# Patient Record
Sex: Male | Born: 1975 | Race: White | Hispanic: No | Marital: Single | Smoking: Current every day smoker
Health system: Southern US, Community
[De-identification: ages and names within clinical notes are randomized; demographics above are authoritative.]

## PROBLEM LIST (undated history)

## (undated) DIAGNOSIS — I739 Peripheral vascular disease, unspecified: Secondary | ICD-10-CM

## (undated) DIAGNOSIS — N2581 Secondary hyperparathyroidism of renal origin: Secondary | ICD-10-CM

## (undated) DIAGNOSIS — N186 End stage renal disease: Secondary | ICD-10-CM

## (undated) DIAGNOSIS — N289 Disorder of kidney and ureter, unspecified: Secondary | ICD-10-CM

## (undated) DIAGNOSIS — I10 Essential (primary) hypertension: Secondary | ICD-10-CM

## (undated) HISTORY — PX: AORTA - FEMORAL ARTERY BYPASS GRAFT: SUR173

## (undated) SURGERY — A/V FISTULAGRAM
Anesthesia: Moderate Sedation | Laterality: Left

---

## 2018-07-09 DIAGNOSIS — Z9889 Other specified postprocedural states: Secondary | ICD-10-CM | POA: Insufficient documentation

## 2018-08-18 DIAGNOSIS — N2581 Secondary hyperparathyroidism of renal origin: Secondary | ICD-10-CM | POA: Insufficient documentation

## 2018-09-15 DIAGNOSIS — N186 End stage renal disease: Secondary | ICD-10-CM | POA: Insufficient documentation

## 2018-09-15 DIAGNOSIS — E785 Hyperlipidemia, unspecified: Secondary | ICD-10-CM | POA: Insufficient documentation

## 2018-09-15 DIAGNOSIS — I5020 Unspecified systolic (congestive) heart failure: Secondary | ICD-10-CM | POA: Insufficient documentation

## 2018-09-30 DIAGNOSIS — E46 Unspecified protein-calorie malnutrition: Secondary | ICD-10-CM | POA: Insufficient documentation

## 2018-10-01 DIAGNOSIS — D649 Anemia, unspecified: Secondary | ICD-10-CM | POA: Insufficient documentation

## 2018-10-01 DIAGNOSIS — K219 Gastro-esophageal reflux disease without esophagitis: Secondary | ICD-10-CM | POA: Insufficient documentation

## 2018-12-23 ENCOUNTER — Emergency Department (HOSPITAL_COMMUNITY)
Admission: EM | Admit: 2018-12-23 | Discharge: 2018-12-24 | Disposition: A | Discharge status: Home / Self Care | Payer: Medicare Other | Source: Home / Self Care | Attending: Emergency Medicine | Admitting: Emergency Medicine

## 2018-12-23 ENCOUNTER — Encounter (HOSPITAL_COMMUNITY): Payer: Self-pay

## 2018-12-23 ENCOUNTER — Other Ambulatory Visit: Payer: Self-pay

## 2018-12-23 DIAGNOSIS — N186 End stage renal disease: Secondary | ICD-10-CM

## 2018-12-23 DIAGNOSIS — Z992 Dependence on renal dialysis: Secondary | ICD-10-CM

## 2018-12-23 HISTORY — DX: Disorder of kidney and ureter, unspecified: N28.9

## 2018-12-23 LAB — CBC
HCT: 34.9 % — ABNORMAL LOW (ref 39.0–52.0)
Hemoglobin: 11 g/dL — ABNORMAL LOW (ref 13.0–17.0)
MCH: 28.9 pg (ref 26.0–34.0)
MCHC: 31.5 g/dL (ref 30.0–36.0)
MCV: 91.6 fL (ref 80.0–100.0)
Platelets: 185 10*3/uL (ref 150–400)
RBC: 3.81 MIL/uL — ABNORMAL LOW (ref 4.22–5.81)
RDW: 15.2 % (ref 11.5–15.5)
WBC: 8.3 10*3/uL (ref 4.0–10.5)
nRBC: 0 % (ref 0.0–0.2)

## 2018-12-23 LAB — BASIC METABOLIC PANEL
Anion gap: 13 (ref 5–15)
BUN: 71 mg/dL — ABNORMAL HIGH (ref 6–20)
CO2: 21 mmol/L — ABNORMAL LOW (ref 22–32)
Calcium: 8.3 mg/dL — ABNORMAL LOW (ref 8.9–10.3)
Chloride: 106 mmol/L (ref 98–111)
Creatinine, Ser: 9.89 mg/dL — ABNORMAL HIGH (ref 0.61–1.24)
GFR calc Af Amer: 7 mL/min — ABNORMAL LOW (ref >60)
GFR calc non Af Amer: 6 mL/min — ABNORMAL LOW (ref >60)
Glucose, Bld: 100 mg/dL — ABNORMAL HIGH (ref 70–99)
Potassium: 4.7 mmol/L (ref 3.5–5.1)
Sodium: 140 mmol/L (ref 135–145)

## 2018-12-23 NOTE — ED Triage Notes (Signed)
Pt states he is from out of town and needs dialysis.

## 2018-12-23 NOTE — ED Provider Notes (Signed)
[facility] Provider Note   CSN: [id number] Arrival date & time: 12/23/18  1035    History   Chief Complaint Chief Complaint  Patient presents with  . Vascular Access Problem    HPI Brett Small is a 43 y.o. male.     HPI Patient presents to the emergency department with a need for dialysis.  The patient states he just moved here Saturday evening and needs dialysis as today is his regularly scheduled day.  Patient has not set up any outpatient dialysis here in [place].  The patient denies chest pain, shortness of breath, headache,blurred vision, neck pain, fever, cough, weakness, numbness, dizziness, anorexia, edema, abdominal pain, nausea, vomiting, diarrhea, rash, back pain, dysuria, hematemesis, bloody stool, near syncope, or syncope. Past Medical History:  Diagnosis Date  . Renal disorder     There are no active problems to display for this patient.        Home Medications    Prior to Admission medications   Not on File    Family History No family history on file.  Social History Social History   Tobacco Use  . Smoking status: Never Smoker  . Smokeless tobacco: Never Used  Substance Use Topics  . Alcohol use: Not Currently  . Drug use: Never     Allergies   Patient has no known allergies.   Review of Systems Review of Systems  All other systems negative except as documented in the HPI. All pertinent positives and negatives as reviewed in the HPI. Physical Exam Updated Vital Signs BP (!) 151/99   Pulse 74   Temp 98.1 F (36.7 C) (Oral)   Resp 16   SpO2 96%   Physical Exam Vitals signs and nursing note reviewed.  Constitutional:      General: He is not in acute distress.    Appearance: He is well-developed.  HENT:     Head: Normocephalic and atraumatic.  Eyes:     Pupils: Pupils are equal, round, and reactive to light.  Neck:     Musculoskeletal: Normal range of motion and neck supple.    Cardiovascular:     Rate and Rhythm: Normal rate and regular rhythm.     Heart sounds: Normal heart sounds. No murmur. No friction rub. No gallop.   Pulmonary:     Effort: Pulmonary effort is normal. No respiratory distress.     Breath sounds: Normal breath sounds. No wheezing.  Skin:    General: Skin is warm and dry.     Capillary Refill: Capillary refill takes less than 2 seconds.     Findings: No erythema or rash.  Neurological:     Mental Status: He is alert and oriented to person, place, and time.     Motor: No abnormal muscle tone.     Coordination: Coordination normal.  Psychiatric:        Behavior: Behavior normal.      ED Treatments / Results  Labs (all labs ordered are listed, but only abnormal results are displayed) Labs Reviewed  BASIC METABOLIC PANEL - Abnormal; Notable for the following components:      Result Value   CO2 21 (*)    Glucose, Bld 100 (*)    BUN 71 (*)    Creatinine, Ser 9.89 (*)    Calcium 8.3 (*)    GFR calc non Af Amer 6 (*)    GFR calc Af Amer 7 (*)    All other components within normal  limits  CBC - Abnormal; Notable for the following components:   RBC 3.81 (*)    Hemoglobin 11.0 (*)    HCT 34.9 (*)    All other components within normal limits    EKG None  Radiology No results found.  Procedures Procedures (including critical care time)  Medications Ordered in ED Medications - No data to display   Initial Impression / Assessment and Plan / ED Course  I have reviewed the triage vital signs and the nursing notes.  Pertinent labs & imaging results that were available during my care of the patient were reviewed by me and considered in my medical decision making (see chart for details).      I spoke with Dr. Burnett Sheng of nephrology who will get the patient has dialysis today.  Patient is been stable here in the emergency department otherwise.  Final Clinical Impressions(s) / ED Diagnoses   Final diagnoses:  None    ED  Discharge Orders    None       Dalia Heading, PA-C 12/23/18 1450    Lajean Saver, MD 12/23/18 1630

## 2018-12-23 NOTE — ED Notes (Signed)
RN informed Pt can 2 receive visitors

## 2018-12-24 ENCOUNTER — Emergency Department (HOSPITAL_COMMUNITY): Payer: Medicare Other

## 2018-12-24 ENCOUNTER — Other Ambulatory Visit: Payer: Self-pay

## 2018-12-24 ENCOUNTER — Inpatient Hospital Stay (HOSPITAL_COMMUNITY)
Admission: EM | Admit: 2018-12-24 | Discharge: 2019-01-07 | DRG: 264 | Disposition: A | Discharge status: Other Acute Inpatient Hospital | Payer: Medicare Other | Attending: Internal Medicine | Admitting: Internal Medicine

## 2018-12-24 DIAGNOSIS — I132 Hypertensive heart and chronic kidney disease with heart failure and with stage 5 chronic kidney disease, or end stage renal disease: Secondary | ICD-10-CM | POA: Diagnosis not present

## 2018-12-24 DIAGNOSIS — E8889 Other specified metabolic disorders: Secondary | ICD-10-CM | POA: Diagnosis present

## 2018-12-24 DIAGNOSIS — Z79899 Other long term (current) drug therapy: Secondary | ICD-10-CM

## 2018-12-24 DIAGNOSIS — F419 Anxiety disorder, unspecified: Secondary | ICD-10-CM | POA: Diagnosis present

## 2018-12-24 DIAGNOSIS — Z86718 Personal history of other venous thrombosis and embolism: Secondary | ICD-10-CM

## 2018-12-24 DIAGNOSIS — Z992 Dependence on renal dialysis: Secondary | ICD-10-CM

## 2018-12-24 DIAGNOSIS — J810 Acute pulmonary edema: Secondary | ICD-10-CM

## 2018-12-24 DIAGNOSIS — N186 End stage renal disease: Secondary | ICD-10-CM

## 2018-12-24 DIAGNOSIS — I739 Peripheral vascular disease, unspecified: Secondary | ICD-10-CM | POA: Diagnosis present

## 2018-12-24 DIAGNOSIS — Z885 Allergy status to narcotic agent status: Secondary | ICD-10-CM

## 2018-12-24 DIAGNOSIS — I10 Essential (primary) hypertension: Secondary | ICD-10-CM

## 2018-12-24 DIAGNOSIS — Z87891 Personal history of nicotine dependence: Secondary | ICD-10-CM

## 2018-12-24 DIAGNOSIS — Z882 Allergy status to sulfonamides status: Secondary | ICD-10-CM

## 2018-12-24 DIAGNOSIS — R0602 Shortness of breath: Secondary | ICD-10-CM | POA: Diagnosis not present

## 2018-12-24 DIAGNOSIS — Z7901 Long term (current) use of anticoagulants: Secondary | ICD-10-CM

## 2018-12-24 DIAGNOSIS — G8929 Other chronic pain: Secondary | ICD-10-CM | POA: Diagnosis present

## 2018-12-24 DIAGNOSIS — Z9115 Patient's noncompliance with renal dialysis: Secondary | ICD-10-CM

## 2018-12-24 DIAGNOSIS — J9601 Acute respiratory failure with hypoxia: Secondary | ICD-10-CM | POA: Diagnosis present

## 2018-12-24 DIAGNOSIS — F322 Major depressive disorder, single episode, severe without psychotic features: Secondary | ICD-10-CM

## 2018-12-24 DIAGNOSIS — Z7982 Long term (current) use of aspirin: Secondary | ICD-10-CM

## 2018-12-24 DIAGNOSIS — Z5900 Homelessness unspecified: Secondary | ICD-10-CM

## 2018-12-24 DIAGNOSIS — N2581 Secondary hyperparathyroidism of renal origin: Secondary | ICD-10-CM | POA: Diagnosis present

## 2018-12-24 DIAGNOSIS — F329 Major depressive disorder, single episode, unspecified: Secondary | ICD-10-CM | POA: Diagnosis present

## 2018-12-24 DIAGNOSIS — N250 Renal osteodystrophy: Secondary | ICD-10-CM

## 2018-12-24 DIAGNOSIS — Z8249 Family history of ischemic heart disease and other diseases of the circulatory system: Secondary | ICD-10-CM

## 2018-12-24 DIAGNOSIS — G629 Polyneuropathy, unspecified: Secondary | ICD-10-CM | POA: Diagnosis not present

## 2018-12-24 DIAGNOSIS — Z832 Family history of diseases of the blood and blood-forming organs and certain disorders involving the immune mechanism: Secondary | ICD-10-CM

## 2018-12-24 DIAGNOSIS — I5022 Chronic systolic (congestive) heart failure: Secondary | ICD-10-CM | POA: Diagnosis present

## 2018-12-24 DIAGNOSIS — R0989 Other specified symptoms and signs involving the circulatory and respiratory systems: Secondary | ICD-10-CM | POA: Diagnosis not present

## 2018-12-24 DIAGNOSIS — R45851 Suicidal ideations: Secondary | ICD-10-CM | POA: Diagnosis present

## 2018-12-24 DIAGNOSIS — D631 Anemia in chronic kidney disease: Secondary | ICD-10-CM | POA: Diagnosis present

## 2018-12-24 DIAGNOSIS — I161 Hypertensive emergency: Secondary | ICD-10-CM | POA: Diagnosis present

## 2018-12-24 HISTORY — DX: Essential (primary) hypertension: I10

## 2018-12-24 MED ORDER — LABETALOL HCL 5 MG/ML IV SOLN
20.0000 mg | Freq: Once | INTRAVENOUS | Status: DC
  Filled 2018-12-24: qty 4

## 2018-12-24 NOTE — ED Notes (Signed)
Reviewed d/c instructions with pt, who verbalized understanding and had no outstanding questions. Unable to obtain signature for d/c due to lack of functional WOW. Pt departed in NAD, refused use of wheelchair.

## 2018-12-24 NOTE — Discharge Instructions (Addendum)
Please return to the ER later this morning for your dialysis session

## 2018-12-24 NOTE — ED Triage Notes (Signed)
BIB EMS from hotel, pt here from out of town. Reports sudden onset of SOB PTA. Hx of ESRD, TThS dialysis, did not get tx Tuesday. Was seen here yesterday to have dialysis but chose to leave, was supposed to come back but did not. Sats initially 80's RA, was placed on NRB with fire and eventually switched to CPAP with EMS. EDP and RT at bedside.

## 2018-12-24 NOTE — ED Provider Notes (Signed)
I assumed care of patient at night shift.  Patient was to have dialysis.  Patient never received dialysis.  He is not hyperkalemic.  No hypoxia.  He has been resting comfortably.  Discussed the case with nephrologist Dr. Hollie Salk She has reviewed labs.  She reports patient will not be able to have dialysis till later this morning. The patient wishes to return in the morning he can leave and come back. I discussed this with the patient at length.  He would prefer to go home and will come back later this morning for dialysis. He was reassured that his labs at this point were appropriate for discharge.  He is in no acute distress.    Ripley Fraise, MD 12/24/18 [phone]

## 2018-12-24 NOTE — ED Provider Notes (Signed)
[facility] Provider Note   CSN: [id number] Arrival date & time: 12/24/18  2319    History   Chief Complaint Chief Complaint  Patient presents with  . Respiratory Distress    HPI Brett Small is a 43 y.o. male.     Level 5 caveat for respiratory distress.  Patient presents via EMS with sudden onset difficulty breathing and hypoxia.  EMS reports initial sats were 80s on room air and was placed on nonrebreather as well as CPAP.  Patient is ESRD patient, Tuesday Thursday Saturday.  His last dialysis session was Saturday, January 29.  He was seen in the ED yesterday but left before getting dialyzed.  He reports sudden onset shortness of breath with tightness in his chest.  Has had a nonproductive cough with one episode of vomiting.  No fever.  No abdominal pain.  No diarrhea.  Does take Xarelto for history of blood clots.  States he makes some urine.  Has been on dialysis in September 2019.  The history is provided by the patient and the EMS personnel. The history is limited by the condition of the patient.    Past Medical History:  Diagnosis Date  . Renal disorder     There are no active problems to display for this patient.   **The histories are not reviewed yet. Please review them in the "History" navigator section and refresh this [place].      Home Medications    Prior to Admission medications   Medication Sig Start Date End Date Taking? Authorizing Provider  acetaminophen (TYLENOL) 325 MG tablet Take 650 mg by mouth every 6 (six) hours as needed for mild pain or headache.    [provider]  amLODipine (NORVASC) 5 MG tablet Take 5 mg by mouth See admin instructions. Take 5 mg by mouth once a day and hold if Systolic reading is <431    [provider]  aspirin (BAYER LOW DOSE) 81 MG EC tablet Take 81 mg by mouth daily. Swallow whole.    [provider]  atorvastatin (LIPITOR) 80 MG tablet Take 80 mg by  mouth at bedtime.    [provider]  B Complex-C-Folic Acid (NEPHRO-VITE PO) Take 1 tablet by mouth daily.    [provider]  carvedilol (COREG) 25 MG tablet Take 25 mg by mouth 2 (two) times daily with a meal.    [provider]  gabapentin (NEURONTIN) 300 MG capsule Take 300 mg by mouth 3 (three) times daily.    [provider]  hydrALAZINE (APRESOLINE) 25 MG tablet Take 25 mg by mouth every 8 (eight) hours.    [provider]  hydrOXYzine (ATARAX/VISTARIL) 25 MG tablet Take 25 mg by mouth 3 (three) times daily as needed for anxiety.    [provider]  rivaroxaban (XARELTO) 2.5 MG TABS tablet Take 2.5 mg by mouth 2 (two) times daily.    [provider]    Family History No family history on file.  Social History Social History   Tobacco Use  . Smoking status: Never Smoker  . Smokeless tobacco: Never Used  Substance Use Topics  . Alcohol use: Not Currently  . Drug use: Never     Allergies   Codeine and Sulfa antibiotics   Review of Systems Review of Systems  Constitutional: Negative for activity change, appetite change and fever.  HENT: Negative for congestion and rhinorrhea.   Respiratory: Positive for cough, chest tightness and shortness of  breath.   Cardiovascular: Positive for chest pain and leg swelling.  Gastrointestinal: Negative for abdominal pain, nausea and vomiting.  Genitourinary: Negative for dysuria and hematuria.  Musculoskeletal: Negative for arthralgias and myalgias.  Skin: Negative for rash.  Neurological: Negative for dizziness, weakness, light-headedness and headaches.    all other systems are negative except as noted in the HPI and PMH.    Physical Exam Updated Vital Signs BP (!) 183/117 (BP Location: Right Arm)   Pulse (!) 128   Temp 98.6 F (37 C) (Oral)   Resp (!) 30   SpO2 94%   Physical Exam Vitals signs and nursing note reviewed.  Constitutional:      General: He is in  acute distress.     Appearance: He is well-developed.     Comments: Moderate respiratory distress, tachypneic on BiPAP Speaking short phrases  HENT:     Head: Normocephalic and atraumatic.     Mouth/Throat:     Pharynx: No oropharyngeal exudate.  Eyes:     Conjunctiva/sclera: Conjunctivae normal.     Pupils: Pupils are equal, round, and reactive to light.  Neck:     Musculoskeletal: Normal range of motion and neck supple.     Comments: No meningismus. Cardiovascular:     Rate and Rhythm: Regular rhythm. Tachycardia present.     Heart sounds: Normal heart sounds. No murmur.  Pulmonary:     Effort: Pulmonary effort is normal. No respiratory distress.     Breath sounds: Rhonchi present.     Comments: Coarse rhonchi throughout Abdominal:     Palpations: Abdomen is soft.     Tenderness: There is no abdominal tenderness. There is no guarding or rebound.  Musculoskeletal: Normal range of motion.        General: No tenderness.     Right lower leg: Edema present.     Left lower leg: Edema present.     Comments: +1 edema to knees  Skin:    General: Skin is warm.  Neurological:     Mental Status: He is alert and oriented to person, place, and time.     Cranial Nerves: No cranial nerve deficit.     Motor: No abnormal muscle tone.     Coordination: Coordination normal.     Comments: No ataxia on finger to nose bilaterally. No pronator drift. 5/5 strength throughout. CN 2-12 intact.Equal grip strength. Sensation intact.   Psychiatric:        Behavior: Behavior normal.      ED Treatments / Results  Labs (all labs ordered are listed, but only abnormal results are displayed) Labs Reviewed  CBC WITH DIFFERENTIAL/PLATELET - Abnormal; Notable for the following components:      Result Value   WBC 14.2 (*)    Hemoglobin 12.3 (*)    Neutro Abs 12.4 (*)    All other components within normal limits  COMPREHENSIVE METABOLIC PANEL - Abnormal; Notable for the following components:   CO2 16  (*)    Glucose, Bld 123 (*)    BUN 82 (*)    Creatinine, Ser 12.33 (*)    Calcium 8.1 (*)    GFR calc non Af Amer 4 (*)    GFR calc Af Amer 5 (*)    Anion gap 18 (*)    All other components within normal limits  BRAIN NATRIURETIC PEPTIDE - Abnormal; Notable for the following components:   B Natriuretic Peptide 2,318.1 (*)    All other components within normal limits  CBC - Abnormal; Notable for the following components:   WBC 12.1 (*)    RBC 3.46 (*)    Hemoglobin 9.9 (*)    HCT 31.2 (*)    All other components within normal limits  PHOSPHORUS - Abnormal; Notable for the following components:   Phosphorus 8.9 (*)    All other components within normal limits  I-STAT CREATININE, ED - Abnormal; Notable for the following components:   Creatinine, Ser 13.30 (*)    All other components within normal limits  CBG MONITORING, ED - Abnormal; Notable for the following components:   Glucose-Capillary 116 (*)    All other components within normal limits  POCT I-STAT EG7 - Abnormal; Notable for the following components:   pCO2, Ven 31.9 (*)    pO2, Ven 167.0 (*)    Bicarbonate 18.1 (*)    TCO2 19 (*)    Acid-base deficit 6.0 (*)    Calcium, Ion 0.91 (*)    HCT 37.0 (*)    Hemoglobin 12.6 (*)    All other components within normal limits  TROPONIN I  HIV ANTIBODY (ROUTINE TESTING W REFLEX)  RENAL FUNCTION PANEL  HEPATITIS B SURFACE ANTIGEN  HEPATITIS C ANTIBODY  HEPATITIS B CORE ANTIBODY, TOTAL  FERRITIN  IRON AND TIBC  VITAMIN D 25 HYDROXY (VIT D DEFICIENCY, FRACTURES)  PARATHYROID HORMONE, INTACT (NO CA)    EKG EKG Interpretation  Date/Time:  Wednesday December 24 2018 23:39:33 EST Ventricular Rate:  120 PR Interval:    QRS Duration: 75 QT Interval:  353 QTC Calculation: 499 R Axis:   64 Text Interpretation:  Sinus tachycardia Anterior infarct, old Nonspecific repol abnormality, lateral leads Baseline wander in lead(s) V3 Artifact No significant change was found Confirmed by  Ezequiel Essex [phone]) on 12/25/2018 1:13:07 AM   Radiology Dg Chest Portable 1 View  Result Date: 12/24/2018 CLINICAL DATA:  Shortness of breath EXAM: PORTABLE CHEST 1 VIEW COMPARISON:  None. FINDINGS: Right-sided central venous catheter tip over the SVC. No pleural effusion. Moderate-to-marked bilateral interstitial, ground glass opacity and consolidations right greater than left. Normal heart size. No pneumothorax. IMPRESSION: Moderate-to-marked bilateral right greater than left interstitial and ground-glass opacity, which may reflect pulmonary edema or diffuse infection. Electronically Signed   By: Donavan Foil M.D.   On: 12/24/2018 23:47    Procedures Procedures (including critical care time)  Medications Ordered in ED Medications - No data to display   Initial Impression / Assessment and Plan / ED Course  I have reviewed the triage vital signs and the nursing notes.  Pertinent labs & imaging results that were available during my care of the patient were reviewed by me and considered in my medical decision making (see chart for details).       ESRD patient with sudden onset shortness of breath and need for dialysis.  Complains of chest pressure and shortness of breath with hypoxia.  Patient placed on BiPAP with good response.  He is given IV labetalol and hydralazine for his blood pressure.  Potassium was normal at 4.7.  Chest x-ray consistent with pulmonary edema.  Need for urgent dialysis discussed with Dr. Augustin Coupe of nephrology.  Patient will be arranged to have dialysis later this morning.  Will plan admission to the hospitalist service. He is not hyperkalemic.   Patient remained stable on BiPAP.  His blood pressure has responded to IV labetalol and IV hydralazine.  His potassium was normal.  Denies chest pain and is breathing comfortably on BiPAP. He  has been seen by Dr. Augustin Coupe for dialysis later this morning.  Admission discussed with internal medicine residents.  CRITICAL  CARE Performed by: Ezequiel Essex Total critical care time: 35 minutes Critical care time was exclusive of separately billable procedures and treating other patients. Critical care was necessary to treat or prevent imminent or life-threatening deterioration. Critical care was time spent personally by me on the following activities: development of treatment plan with patient and/or surrogate as well as nursing, discussions with consultants, evaluation of patient's response to treatment, examination of patient, obtaining history from patient or surrogate, ordering and performing treatments and interventions, ordering and review of laboratory studies, ordering and review of radiographic studies, pulse oximetry and re-evaluation of patient's condition.  Final Clinical Impressions(s) / ED Diagnoses   Final diagnoses:  Acute pulmonary edema [facility])  Hypertensive emergency    ED Discharge Orders    None       Chyna Kneece, Annie Main, MD 12/25/18 [phone]

## 2018-12-25 ENCOUNTER — Inpatient Hospital Stay (HOSPITAL_COMMUNITY): Payer: Medicare Other

## 2018-12-25 ENCOUNTER — Encounter (HOSPITAL_COMMUNITY): Payer: Self-pay | Admitting: Internal Medicine

## 2018-12-25 DIAGNOSIS — F419 Anxiety disorder, unspecified: Secondary | ICD-10-CM | POA: Diagnosis present

## 2018-12-25 DIAGNOSIS — T827XXA Infection and inflammatory reaction due to other cardiac and vascular devices, implants and grafts, initial encounter: Secondary | ICD-10-CM | POA: Diagnosis not present

## 2018-12-25 DIAGNOSIS — Z9115 Patient's noncompliance with renal dialysis: Secondary | ICD-10-CM

## 2018-12-25 DIAGNOSIS — T82898A Other specified complication of vascular prosthetic devices, implants and grafts, initial encounter: Secondary | ICD-10-CM | POA: Diagnosis not present

## 2018-12-25 DIAGNOSIS — Z720 Tobacco use: Secondary | ICD-10-CM

## 2018-12-25 DIAGNOSIS — I161 Hypertensive emergency: Secondary | ICD-10-CM | POA: Diagnosis present

## 2018-12-25 DIAGNOSIS — Z7982 Long term (current) use of aspirin: Secondary | ICD-10-CM

## 2018-12-25 DIAGNOSIS — Z833 Family history of diabetes mellitus: Secondary | ICD-10-CM | POA: Diagnosis not present

## 2018-12-25 DIAGNOSIS — Z86718 Personal history of other venous thrombosis and embolism: Secondary | ICD-10-CM | POA: Diagnosis not present

## 2018-12-25 DIAGNOSIS — Z87891 Personal history of nicotine dependence: Secondary | ICD-10-CM | POA: Diagnosis not present

## 2018-12-25 DIAGNOSIS — I132 Hypertensive heart and chronic kidney disease with heart failure and with stage 5 chronic kidney disease, or end stage renal disease: Secondary | ICD-10-CM | POA: Diagnosis present

## 2018-12-25 DIAGNOSIS — Z95820 Peripheral vascular angioplasty status with implants and grafts: Secondary | ICD-10-CM

## 2018-12-25 DIAGNOSIS — F341 Dysthymic disorder: Secondary | ICD-10-CM | POA: Diagnosis present

## 2018-12-25 DIAGNOSIS — J9601 Acute respiratory failure with hypoxia: Secondary | ICD-10-CM | POA: Diagnosis present

## 2018-12-25 DIAGNOSIS — E8770 Fluid overload, unspecified: Secondary | ICD-10-CM | POA: Diagnosis present

## 2018-12-25 DIAGNOSIS — Z992 Dependence on renal dialysis: Secondary | ICD-10-CM

## 2018-12-25 DIAGNOSIS — T82858A Stenosis of vascular prosthetic devices, implants and grafts, initial encounter: Secondary | ICD-10-CM | POA: Diagnosis not present

## 2018-12-25 DIAGNOSIS — G629 Polyneuropathy, unspecified: Secondary | ICD-10-CM | POA: Diagnosis not present

## 2018-12-25 DIAGNOSIS — R0989 Other specified symptoms and signs involving the circulatory and respiratory systems: Secondary | ICD-10-CM | POA: Diagnosis not present

## 2018-12-25 DIAGNOSIS — R45851 Suicidal ideations: Secondary | ICD-10-CM | POA: Diagnosis present

## 2018-12-25 DIAGNOSIS — M79642 Pain in left hand: Secondary | ICD-10-CM | POA: Diagnosis not present

## 2018-12-25 DIAGNOSIS — Z01810 Encounter for preprocedural cardiovascular examination: Secondary | ICD-10-CM

## 2018-12-25 DIAGNOSIS — F322 Major depressive disorder, single episode, severe without psychotic features: Secondary | ICD-10-CM | POA: Diagnosis not present

## 2018-12-25 DIAGNOSIS — Z8249 Family history of ischemic heart disease and other diseases of the circulatory system: Secondary | ICD-10-CM | POA: Diagnosis not present

## 2018-12-25 DIAGNOSIS — N250 Renal osteodystrophy: Secondary | ICD-10-CM | POA: Diagnosis present

## 2018-12-25 DIAGNOSIS — I120 Hypertensive chronic kidney disease with stage 5 chronic kidney disease or end stage renal disease: Secondary | ICD-10-CM | POA: Diagnosis not present

## 2018-12-25 DIAGNOSIS — Z832 Family history of diseases of the blood and blood-forming organs and certain disorders involving the immune mechanism: Secondary | ICD-10-CM | POA: Diagnosis not present

## 2018-12-25 DIAGNOSIS — I5020 Unspecified systolic (congestive) heart failure: Secondary | ICD-10-CM | POA: Diagnosis not present

## 2018-12-25 DIAGNOSIS — Z79899 Other long term (current) drug therapy: Secondary | ICD-10-CM | POA: Diagnosis not present

## 2018-12-25 DIAGNOSIS — R0602 Shortness of breath: Secondary | ICD-10-CM | POA: Diagnosis present

## 2018-12-25 DIAGNOSIS — G8929 Other chronic pain: Secondary | ICD-10-CM

## 2018-12-25 DIAGNOSIS — Z882 Allergy status to sulfonamides status: Secondary | ICD-10-CM | POA: Diagnosis not present

## 2018-12-25 DIAGNOSIS — I5022 Chronic systolic (congestive) heart failure: Secondary | ICD-10-CM | POA: Diagnosis present

## 2018-12-25 DIAGNOSIS — D631 Anemia in chronic kidney disease: Secondary | ICD-10-CM | POA: Diagnosis present

## 2018-12-25 DIAGNOSIS — I739 Peripheral vascular disease, unspecified: Secondary | ICD-10-CM | POA: Diagnosis present

## 2018-12-25 DIAGNOSIS — T82318A Breakdown (mechanical) of other vascular grafts, initial encounter: Secondary | ICD-10-CM | POA: Diagnosis present

## 2018-12-25 DIAGNOSIS — Z7901 Long term (current) use of anticoagulants: Secondary | ICD-10-CM | POA: Diagnosis not present

## 2018-12-25 DIAGNOSIS — F4321 Adjustment disorder with depressed mood: Secondary | ICD-10-CM | POA: Diagnosis present

## 2018-12-25 DIAGNOSIS — Z881 Allergy status to other antibiotic agents status: Secondary | ICD-10-CM

## 2018-12-25 DIAGNOSIS — Z590 Homelessness: Secondary | ICD-10-CM | POA: Diagnosis not present

## 2018-12-25 DIAGNOSIS — N186 End stage renal disease: Secondary | ICD-10-CM

## 2018-12-25 DIAGNOSIS — I10 Essential (primary) hypertension: Secondary | ICD-10-CM

## 2018-12-25 DIAGNOSIS — Z885 Allergy status to narcotic agent status: Secondary | ICD-10-CM

## 2018-12-25 DIAGNOSIS — L03114 Cellulitis of left upper limb: Secondary | ICD-10-CM | POA: Diagnosis not present

## 2018-12-25 DIAGNOSIS — Z89422 Acquired absence of other left toe(s): Secondary | ICD-10-CM

## 2018-12-25 DIAGNOSIS — E1122 Type 2 diabetes mellitus with diabetic chronic kidney disease: Secondary | ICD-10-CM | POA: Diagnosis present

## 2018-12-25 DIAGNOSIS — Y832 Surgical operation with anastomosis, bypass or graft as the cause of abnormal reaction of the patient, or of later complication, without mention of misadventure at the time of the procedure: Secondary | ICD-10-CM | POA: Diagnosis present

## 2018-12-25 DIAGNOSIS — N2581 Secondary hyperparathyroidism of renal origin: Secondary | ICD-10-CM | POA: Diagnosis present

## 2018-12-25 DIAGNOSIS — E8889 Other specified metabolic disorders: Secondary | ICD-10-CM | POA: Diagnosis present

## 2018-12-25 DIAGNOSIS — M545 Low back pain: Secondary | ICD-10-CM

## 2018-12-25 DIAGNOSIS — I871 Compression of vein: Secondary | ICD-10-CM | POA: Diagnosis present

## 2018-12-25 DIAGNOSIS — F329 Major depressive disorder, single episode, unspecified: Secondary | ICD-10-CM | POA: Diagnosis present

## 2018-12-25 LAB — IRON AND TIBC
Iron: 18 ug/dL — ABNORMAL LOW (ref 45–182)
SATURATION RATIOS: 9 % — AB (ref 17.9–39.5)
TIBC: 211 ug/dL — AB (ref 250–450)
UIBC: 193 ug/dL

## 2018-12-25 LAB — COMPREHENSIVE METABOLIC PANEL
ALBUMIN: 3.5 g/dL (ref 3.5–5.0)
ALT: 17 U/L (ref 0–44)
AST: 18 U/L (ref 15–41)
Alkaline Phosphatase: 67 U/L (ref 38–126)
Anion gap: 18 — ABNORMAL HIGH (ref 5–15)
BUN: 82 mg/dL — ABNORMAL HIGH (ref 6–20)
CO2: 16 mmol/L — ABNORMAL LOW (ref 22–32)
Calcium: 8.1 mg/dL — ABNORMAL LOW (ref 8.9–10.3)
Chloride: 107 mmol/L (ref 98–111)
Creatinine, Ser: 12.33 mg/dL — ABNORMAL HIGH (ref 0.61–1.24)
GFR calc Af Amer: 5 mL/min — ABNORMAL LOW (ref >60)
GFR calc non Af Amer: 4 mL/min — ABNORMAL LOW (ref >60)
Glucose, Bld: 123 mg/dL — ABNORMAL HIGH (ref 70–99)
Potassium: 4.6 mmol/L (ref 3.5–5.1)
Sodium: 141 mmol/L (ref 135–145)
Total Bilirubin: 0.6 mg/dL (ref 0.3–1.2)
Total Protein: 7.1 g/dL (ref 6.5–8.1)

## 2018-12-25 LAB — FERRITIN: Ferritin: 165 ng/mL (ref 24–336)

## 2018-12-25 LAB — POCT I-STAT EG7
Acid-base deficit: 6 mmol/L — ABNORMAL HIGH (ref 0.0–2.0)
Bicarbonate: 18.1 mmol/L — ABNORMAL LOW (ref 20.0–28.0)
Calcium, Ion: 0.91 mmol/L — ABNORMAL LOW (ref 1.15–1.40)
HEMATOCRIT: 37 % — AB (ref 39.0–52.0)
Hemoglobin: 12.6 g/dL — ABNORMAL LOW (ref 13.0–17.0)
O2 Saturation: 99 %
POTASSIUM: 4.7 mmol/L (ref 3.5–5.1)
Sodium: 139 mmol/L (ref 135–145)
TCO2: 19 mmol/L — AB (ref 22–32)
pCO2, Ven: 31.9 mmHg — ABNORMAL LOW (ref 44.0–60.0)
pH, Ven: 7.363 (ref 7.250–7.430)
pO2, Ven: 167 mmHg — ABNORMAL HIGH (ref 32.0–45.0)

## 2018-12-25 LAB — CBC WITH DIFFERENTIAL/PLATELET
Abs Immature Granulocytes: 0.05 10*3/uL (ref 0.00–0.07)
BASOS ABS: 0.1 10*3/uL (ref 0.0–0.1)
Basophils Relative: 1 %
Eosinophils Absolute: 0.3 10*3/uL (ref 0.0–0.5)
Eosinophils Relative: 2 %
HCT: 39.3 % (ref 39.0–52.0)
Hemoglobin: 12.3 g/dL — ABNORMAL LOW (ref 13.0–17.0)
Immature Granulocytes: 0 %
Lymphocytes Relative: 6 %
Lymphs Abs: 0.8 10*3/uL (ref 0.7–4.0)
MCH: 28.4 pg (ref 26.0–34.0)
MCHC: 31.3 g/dL (ref 30.0–36.0)
MCV: 90.8 fL (ref 80.0–100.0)
Monocytes Absolute: 0.6 10*3/uL (ref 0.1–1.0)
Monocytes Relative: 4 %
NEUTROS ABS: 12.4 10*3/uL — AB (ref 1.7–7.7)
NEUTROS PCT: 87 %
NRBC: 0 % (ref 0.0–0.2)
Platelets: 214 10*3/uL (ref 150–400)
RBC: 4.33 MIL/uL (ref 4.22–5.81)
RDW: 15.3 % (ref 11.5–15.5)
WBC: 14.2 10*3/uL — ABNORMAL HIGH (ref 4.0–10.5)

## 2018-12-25 LAB — RENAL FUNCTION PANEL
Albumin: 2.8 g/dL — ABNORMAL LOW (ref 3.5–5.0)
Anion gap: 21 — ABNORMAL HIGH (ref 5–15)
BUN: 84 mg/dL — ABNORMAL HIGH (ref 6–20)
CALCIUM: 7.8 mg/dL — AB (ref 8.9–10.3)
CHLORIDE: 106 mmol/L (ref 98–111)
CO2: 15 mmol/L — ABNORMAL LOW (ref 22–32)
CREATININE: 12.16 mg/dL — AB (ref 0.61–1.24)
GFR calc Af Amer: 5 mL/min — ABNORMAL LOW (ref >60)
GFR calc non Af Amer: 4 mL/min — ABNORMAL LOW (ref >60)
Glucose, Bld: 112 mg/dL — ABNORMAL HIGH (ref 70–99)
Phosphorus: 9 mg/dL — ABNORMAL HIGH (ref 2.5–4.6)
Potassium: 4.4 mmol/L (ref 3.5–5.1)
Sodium: 142 mmol/L (ref 135–145)

## 2018-12-25 LAB — CBC
HCT: 31.2 % — ABNORMAL LOW (ref 39.0–52.0)
Hemoglobin: 9.9 g/dL — ABNORMAL LOW (ref 13.0–17.0)
MCH: 28.6 pg (ref 26.0–34.0)
MCHC: 31.7 g/dL (ref 30.0–36.0)
MCV: 90.2 fL (ref 80.0–100.0)
Platelets: 191 10*3/uL (ref 150–400)
RBC: 3.46 MIL/uL — ABNORMAL LOW (ref 4.22–5.81)
RDW: 15.3 % (ref 11.5–15.5)
WBC: 12.1 10*3/uL — ABNORMAL HIGH (ref 4.0–10.5)
nRBC: 0 % (ref 0.0–0.2)

## 2018-12-25 LAB — HIV ANTIBODY (ROUTINE TESTING W REFLEX): HIV Screen 4th Generation wRfx: NONREACTIVE

## 2018-12-25 LAB — CBG MONITORING, ED: Glucose-Capillary: 116 mg/dL — ABNORMAL HIGH (ref 70–99)

## 2018-12-25 LAB — BRAIN NATRIURETIC PEPTIDE: B Natriuretic Peptide: 2318.1 pg/mL — ABNORMAL HIGH (ref 0.0–100.0)

## 2018-12-25 LAB — PHOSPHORUS: Phosphorus: 8.9 mg/dL — ABNORMAL HIGH (ref 2.5–4.6)

## 2018-12-25 LAB — TROPONIN I: Troponin I: <0.03 ng/mL (ref <0.03)

## 2018-12-25 LAB — I-STAT CREATININE, ED: Creatinine, Ser: 13.3 mg/dL — ABNORMAL HIGH (ref 0.61–1.24)

## 2018-12-25 MED ORDER — SODIUM CHLORIDE 0.9 % IV SOLN
100.0000 mL | INTRAVENOUS | Status: DC | PRN
  Administered 2018-12-26 (×2): via INTRAVENOUS

## 2018-12-25 MED ORDER — LIDOCAINE HCL (PF) 1 % IJ SOLN: 5.0000 mL | INTRAMUSCULAR | Status: DC | PRN

## 2018-12-25 MED ORDER — HEPARIN SODIUM (PORCINE) 1000 UNIT/ML DIALYSIS
1000.0000 [IU] | INTRAMUSCULAR | Status: DC | PRN
  Administered 2018-12-27: 3000 [IU] via INTRAVENOUS_CENTRAL
  Administered 2018-12-30: 1000 [IU] via INTRAVENOUS_CENTRAL
  Filled 2018-12-25 (×3): qty 1

## 2018-12-25 MED ORDER — HEPARIN SODIUM (PORCINE) 5000 UNIT/ML IJ SOLN
5000.0000 [IU] | Freq: Three times a day (TID) | INTRAMUSCULAR | Status: DC
  Administered 2018-12-25 – 2018-12-27 (×5): 5000 [IU] via SUBCUTANEOUS
  Filled 2018-12-25 (×5): qty 1

## 2018-12-25 MED ORDER — SODIUM CHLORIDE 0.9 % IV SOLN
1.5000 g | INTRAVENOUS | Status: AC
  Administered 2018-12-26: 1.5 g via INTRAVENOUS
  Filled 2018-12-25: qty 1.5

## 2018-12-25 MED ORDER — RENA-VITE PO TABS
1.0000 | ORAL_TABLET | Freq: Every day | ORAL | Status: DC
  Administered 2018-12-25 – 2019-01-06 (×13): 1 via ORAL
  Filled 2018-12-25 (×13): qty 1

## 2018-12-25 MED ORDER — ATORVASTATIN CALCIUM 80 MG PO TABS
80.0000 mg | ORAL_TABLET | Freq: Every day | ORAL | Status: DC
  Administered 2018-12-25 – 2019-01-06 (×13): 80 mg via ORAL
  Filled 2018-12-25 (×13): qty 1

## 2018-12-25 MED ORDER — SODIUM CHLORIDE 0.9 % IV SOLN: 100.0000 mL | INTRAVENOUS | Status: DC | PRN

## 2018-12-25 MED ORDER — LABETALOL HCL 5 MG/ML IV SOLN
10.0000 mg | Freq: Once | INTRAVENOUS | Status: AC
  Administered 2018-12-25: 10 mg via INTRAVENOUS

## 2018-12-25 MED ORDER — HYDRALAZINE HCL 20 MG/ML IJ SOLN
10.0000 mg | Freq: Once | INTRAMUSCULAR | Status: DC
  Filled 2018-12-25: qty 1

## 2018-12-25 MED ORDER — PENTAFLUOROPROP-TETRAFLUOROETH EX AERO: 1.0000 "application " | INHALATION_SPRAY | CUTANEOUS | Status: DC | PRN

## 2018-12-25 MED ORDER — GABAPENTIN 300 MG PO CAPS
300.0000 mg | ORAL_CAPSULE | Freq: Three times a day (TID) | ORAL | Status: DC
  Administered 2018-12-25 – 2018-12-27 (×5): 300 mg via ORAL
  Filled 2018-12-25 (×7): qty 1

## 2018-12-25 MED ORDER — LIDOCAINE-PRILOCAINE 2.5-2.5 % EX CREA
1.0000 "application " | TOPICAL_CREAM | CUTANEOUS | Status: DC | PRN
  Filled 2018-12-25: qty 5

## 2018-12-25 MED ORDER — ONDANSETRON HCL 4 MG/2ML IJ SOLN: 4.0000 mg | Freq: Four times a day (QID) | INTRAMUSCULAR | Status: DC | PRN

## 2018-12-25 MED ORDER — AMLODIPINE BESYLATE 5 MG PO TABS
5.0000 mg | ORAL_TABLET | Freq: Every day | ORAL | Status: DC
  Administered 2018-12-25 – 2019-01-03 (×9): 5 mg via ORAL
  Filled 2018-12-25 (×10): qty 1

## 2018-12-25 MED ORDER — ASPIRIN EC 81 MG PO TBEC
81.0000 mg | DELAYED_RELEASE_TABLET | Freq: Every day | ORAL | Status: DC
  Administered 2018-12-25 – 2019-01-07 (×13): 81 mg via ORAL
  Filled 2018-12-25 (×14): qty 1

## 2018-12-25 MED ORDER — ALTEPLASE 2 MG IJ SOLR: 2.0000 mg | Freq: Once | INTRAMUSCULAR | Status: DC | PRN

## 2018-12-25 MED ORDER — ONDANSETRON HCL 4 MG PO TABS
4.0000 mg | ORAL_TABLET | Freq: Four times a day (QID) | ORAL | Status: DC | PRN
  Administered 2018-12-30: 4 mg via ORAL
  Filled 2018-12-25: qty 1

## 2018-12-25 MED ORDER — CHLORHEXIDINE GLUCONATE CLOTH 2 % EX PADS
6.0000 | MEDICATED_PAD | Freq: Every day | CUTANEOUS | Status: DC
  Administered 2018-12-26 – 2018-12-27 (×2): 6 via TOPICAL

## 2018-12-25 MED ORDER — HYDROXYZINE HCL 25 MG PO TABS: 25.0000 mg | ORAL_TABLET | Freq: Three times a day (TID) | ORAL | Status: DC | PRN

## 2018-12-25 MED ORDER — HYDRALAZINE HCL 25 MG PO TABS
25.0000 mg | ORAL_TABLET | Freq: Three times a day (TID) | ORAL | Status: DC
  Administered 2018-12-25 – 2019-01-07 (×31): 25 mg via ORAL
  Filled 2018-12-25 (×33): qty 1

## 2018-12-25 MED ORDER — LIDOCAINE 5 % EX PTCH
1.0000 | MEDICATED_PATCH | CUTANEOUS | Status: DC
  Filled 2018-12-25: qty 1

## 2018-12-25 MED ORDER — RIVAROXABAN 2.5 MG PO TABS: 2.5000 mg | ORAL_TABLET | Freq: Two times a day (BID) | ORAL | Status: DC

## 2018-12-25 MED ORDER — HEPARIN SODIUM (PORCINE) 1000 UNIT/ML IJ SOLN
INTRAMUSCULAR | Status: AC
  Administered 2018-12-25: 3000 [IU]
  Filled 2018-12-25: qty 3

## 2018-12-25 MED ORDER — CARVEDILOL 25 MG PO TABS
25.0000 mg | ORAL_TABLET | Freq: Two times a day (BID) | ORAL | Status: DC
  Administered 2018-12-25 – 2019-01-07 (×23): 25 mg via ORAL
  Filled 2018-12-25 (×8): qty 1
  Filled 2018-12-25: qty 2
  Filled 2018-12-25 (×15): qty 1

## 2018-12-25 MED ORDER — ACETAMINOPHEN 325 MG PO TABS
650.0000 mg | ORAL_TABLET | Freq: Four times a day (QID) | ORAL | Status: DC | PRN
  Administered 2018-12-26 – 2019-01-02 (×3): 650 mg via ORAL
  Filled 2018-12-25 (×3): qty 2

## 2018-12-25 NOTE — Consult Note (Signed)
[facility] Nurse wound consult note Reason for Consult: Nonhealing surgical wound to right lateral malleolus.  He states there is no history of osteomyelitis but periodically it will fester and purulent effluent will develop.  Today, it is dry.  Wound type:nonhealing chronic surgical wound Pressure Injury POA: NA Measurement: 3 cm x 4 cm x 0.2 cm- mostly scarred epithelium with drainage periodically from center.  Wound VQW:QVLD visible Drainage (amount, consistency, odor) none today Periwound:scarring Dressing procedure/placement/frequency:Noted sulfa allergy. Cleanse right ankle with NS and pat dry.  Apply Xeroform antimicrobial dressing. Cover with gauze and kerlix/tape.  Change daily.  Will not follow at this time.  Please re-consult if needed.  Domenic Moras MSN, RN, FNP-BC CWON Wound, Ostomy, Continence Nurse Pager [phone]

## 2018-12-25 NOTE — Progress Notes (Addendum)
   Subjective: Mr. Sievers was seen during HD today. He reported feeling better and that his breathing had improved. Denied any other symptoms at this time. He states he started HD in September of 2019 due to blood clots 2/2 to a bypass surgery he had. He did not have any plans for vascular access in the past. He is currently staying at a motel in [place] but plans on staying here long term.   Objective:  Vital signs in last 24 hours: Vitals:   12/25/18 1000 12/25/18 1030 12/25/18 1130 12/25/18 1200  BP: (!) 149/101 (!) 149/97 (!) 162/98 (!) 168/106  Pulse: 93 91 (!) 101 100  Resp: 20 (!) 24 15 16   Temp:      TempSrc:      SpO2: 100%     Weight:      Height:       Gen: comfortably sitting in bed at HD, no distress CV: RRR, no murmurs Skin: warm and dry  Assessment/Plan:  Principal Problem:   End stage renal disease (HCC) Active Problems:   Acute respiratory failure with hypoxia (HCC)   Hypertension   Renal osteodystrophy  42yoM on Xarelto for h/o blood clots and with ESRD on HD TThSa via temporary right IJ catheter since Sept 2019, HTN, HFrEF (EF 45-50%), PVD s/p aortobifemoral bypass w/ redo after closure and 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 His previous vascular surgeon wanted him to continue rivaroxaban even though he is ESRD.  - hold rivaroxaban  HTN - 159/96 - Continue with amlodipine 5mg  qd, coreg 25mg  bid, hydralazine 25mg  q8h  Chronic back pain - reports taking tylenol, tramadol, and oxycodone. The narcotic database does not show any regular  prescriptions for these medications. He received a 7 day supply of oxycodone in Jan and 5 day supply of tramadol in Dec.  - Continue tylenol, lidocaine patch, k pad    Dispo: Anticipated discharge pending being CLIP'd.  Mike Craze, DO 12/25/2018, 1:09 PM Pager: [phone]

## 2018-12-25 NOTE — Progress Notes (Addendum)
   Subjective: No events overnight. Brett Small was made npo at midnight to prepare for a vascular surgery procedure. Brett Small feels much better after receiving dialysis yesterday. His breathing is improved. He had a vascular surgery procedure this morning, and now notices some numbness and tingling in the thumb, index, and middle ringer of his left hand. He has no pain in his hand, just a change in sensation. Otherwise, Brett Small has no concerns  Objective:  Vital signs in last 24 hours: Vitals:   12/26/18 0616 12/26/18 1125 12/26/18 1140 12/26/18 1155  BP: 121/66  117/78 111/70  Pulse: 80  74 77  Resp: 16  13 (!) 8  Temp: 98.1 F (36.7 C) (!) 97.5 F (36.4 C)  97.7 F (36.5 C)  TempSrc: Oral     SpO2: 98%  95% 92%  Weight: 48.3 kg     Height:       Physical exam: General: pt is comfortable in bed Cardiovascular: rrr, no murmurs, thrills, or heaves. No extremity edema. Pulses 2+ in left brachial, ulnar, and radial arteries. Respiratory: lungs are CTA bilaterally, good respiratory effort Integumentary: warm and dry Extremities: venous graft in the upper left arm. Paresthesias in the left hand along the median nerve distribution. No swelling or erythema in the left arm. Strength is 5/5, ulnar and radial pulses are 2+.   Assessment/Plan:  Principal Problem:   End stage renal disease ([place]) Active Problems:   Acute respiratory failure with hypoxia (HCC)   Hypertension   Renal osteodystrophy  43 year old person admitted with pulmonary edema due to ESRD on HD TThSa via temporary right IJ catheter since Sept 2019, HTN, HFrEF (EF45-50%), PVDs/p aortobifemoral bypass w/ redo after closure and partial amputation of L 4th and 5 toespresenting on 3/5 with acute onset sob in setting of missing dialysis.   Acute respiratory failure with hypoxia 2/2 ESRD on HD. Much improved after HD on 3/5. Looks euvolemic today. Needs local HD center and we are awaiting CLIP process. Greatly appreciate  nephrology consultation.  - Plans to be CLIP'd  PVD. 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 Continue aspirin 81mg  daily. Would also like to restart low dose xarelto given his history of recurrent graft thrombosis, may be able to start back tomorrow if ok with vascular surgery from bleeding risk standpoint.  HTN - 111/70 (3/6 1155) - Continue with amlodipine 5mg  qd, coreg 25mg  bid, hydralazine 25mg  q8h  Chronic back pain. Stable. - Continue tylenol, lidocaine patch, k pad   Dispo: Anticipated discharge pending being CLIP'd.   LOS: 1 day   Noralyn Pick, Medical Student 12/26/2018, 3:23 PM Pager: [phone]   Attestation for Student Documentation:  I personally was present and performed or re-performed the history, physical exam and medical decision-making activities of this service and have verified that the service and findings are accurately documented in the student's note. I made necessary changes to this note.  Axel Filler, MD 12/26/2018, 3:31 PM

## 2018-12-25 NOTE — ED Notes (Signed)
Per admitting provider left arm should now be considered restricted.

## 2018-12-25 NOTE — ED Notes (Signed)
ED TO INPATIENT HANDOFF REPORT  ED Nurse Name and Phone #:  [place] [phone]  S Name/Age/Gender Brett Small 43 y.o. male Room/Bed: TRAAC/TRAAC  Code Status   Code Status: Partial Code  Home/SNF/Other Home Patient oriented to: self, place, time and situation Is this baseline? Yes   Triage Complete: Triage complete  Chief Complaint CPAP  Triage Note BIB EMS from hotel, pt here from out of town. Reports sudden onset of SOB PTA. Hx of ESRD, TThS dialysis, did not get tx Tuesday. Was seen here yesterday to have dialysis but chose to leave, was supposed to come back but did not. Sats initially 80's RA, was placed on NRB with fire and eventually switched to CPAP with EMS. EDP and RT at bedside.   Allergies Allergies  Allergen Reactions  . Codeine Nausea Only    Patient questioned this (??)  . Sulfa Antibiotics Hives and Nausea And Vomiting    Level of Care/Admitting Diagnosis ED Disposition    ED Disposition Condition [facility] Area: [place] [100100]  Level of Care: Progressive [102]  Diagnosis: Acute respiratory failure with hypoxia [facility]) [[id number]]  Admitting Physician: Axel Filler [phone]  Attending Physician: Axel Filler [[id number]]  Estimated length of stay: 5 - 7 days  Certification:: I certify this patient will need inpatient services for at least 2 midnights  PT Class (Do Not Modify): Inpatient [101]  PT Acc Code (Do Not Modify): Private [1]       B Medical/Surgery History Past Medical History:  Diagnosis Date  . Hypertension   . Renal disorder       A IV Location/Drains/Wounds Patient Lines/Drains/Airways Status   Active Line/Drains/Airways    Name:   Placement date:   Placement time:   Site:   Days:   Peripheral IV 12/25/18 Right;Upper Arm   12/25/18    0117    Arm   less than 1          Intake/Output Last 24 hours No intake or output data in the 24 hours ending 12/25/18  0308  Labs/Imaging Results for orders placed or performed during the hospital encounter of 12/24/18 (from the past 48 hour(s))  CBC with Differential/Platelet     Status: Abnormal   Collection Time: 12/24/18 11:55 PM  Result Value Ref Range   WBC 14.2 (H) 4.0 - 10.5 K/uL   RBC 4.33 4.22 - 5.81 MIL/uL   Hemoglobin 12.3 (L) 13.0 - 17.0 g/dL   HCT 39.3 39.0 - 52.0 %   MCV 90.8 80.0 - 100.0 fL   MCH 28.4 26.0 - 34.0 pg   MCHC 31.3 30.0 - 36.0 g/dL   RDW 15.3 11.5 - 15.5 %   Platelets 214 150 - 400 K/uL   nRBC 0.0 0.0 - 0.2 %   Neutrophils Relative % 87 %   Neutro Abs 12.4 (H) 1.7 - 7.7 K/uL   Lymphocytes Relative 6 %   Lymphs Abs 0.8 0.7 - 4.0 K/uL   Monocytes Relative 4 %   Monocytes Absolute 0.6 0.1 - 1.0 K/uL   Eosinophils Relative 2 %   Eosinophils Absolute 0.3 0.0 - 0.5 K/uL   Basophils Relative 1 %   Basophils Absolute 0.1 0.0 - 0.1 K/uL   Immature Granulocytes 0 %   Abs Immature Granulocytes 0.05 0.00 - 0.07 K/uL    Comment: Performed at [facility], [address], [place], [place] [address]  Comprehensive metabolic panel  Status: Abnormal   Collection Time: 12/24/18 11:55 PM  Result Value Ref Range   Sodium 141 135 - 145 mmol/L   Potassium 4.6 3.5 - 5.1 mmol/L   Chloride 107 98 - 111 mmol/L   CO2 16 (L) 22 - 32 mmol/L   Glucose, Bld 123 (H) 70 - 99 mg/dL   BUN 82 (H) 6 - 20 mg/dL   Creatinine, Ser 12.33 (H) 0.61 - 1.24 mg/dL   Calcium 8.1 (L) 8.9 - 10.3 mg/dL   Total Protein 7.1 6.5 - 8.1 g/dL   Albumin 3.5 3.5 - 5.0 g/dL   AST 18 15 - 41 U/L   ALT 17 0 - 44 U/L   Alkaline Phosphatase 67 38 - 126 U/L   Total Bilirubin 0.6 0.3 - 1.2 mg/dL   GFR calc non Af Amer 4 (L) >60 mL/min   GFR calc Af Amer 5 (L) >60 mL/min   Anion gap 18 (H) 5 - 15    Comment: Performed at [facility], [address], [place], [place] [address]  Brain natriuretic peptide     Status: Abnormal   Collection Time: 12/24/18 11:55 PM  Result Value Ref Range   B Natriuretic  Peptide 2,318.1 (H) 0.0 - 100.0 pg/mL    Comment: Performed at [facility] [address] [place], [place] [address]  Troponin I - ONCE - STAT     Status: None   Collection Time: 12/24/18 11:55 PM  Result Value Ref Range   Troponin I <0.03 <0.03 ng/mL    Comment: Performed at [facility], [place] [address] [place], [place] [address]  I-stat Creatinine, ED     Status: Abnormal   Collection Time: 12/25/18 12:08 AM  Result Value Ref Range   Creatinine, Ser 13.30 (H) 0.61 - 1.24 mg/dL  POCT I-Stat EG7     Status: Abnormal   Collection Time: 12/25/18 12:08 AM  Result Value Ref Range   pH, Ven 7.363 7.250 - 7.430   pCO2, Ven 31.9 (L) 44.0 - 60.0 mmHg   pO2, Ven 167.0 (H) 32.0 - 45.0 mmHg   Bicarbonate 18.1 (L) 20.0 - 28.0 mmol/L   TCO2 19 (L) 22 - 32 mmol/L   O2 Saturation 99.0 %   Acid-base deficit 6.0 (H) 0.0 - 2.0 mmol/L   Sodium 139 135 - 145 mmol/L   Potassium 4.7 3.5 - 5.1 mmol/L   Calcium, Ion 0.91 (L) 1.15 - 1.40 mmol/L   HCT 37.0 (L) 39.0 - 52.0 %   Hemoglobin 12.6 (L) 13.0 - 17.0 g/dL   Patient temperature HIDE    Sample type VENOUS   CBG monitoring, ED     Status: Abnormal   Collection Time: 12/25/18 12:53 AM  Result Value Ref Range   Glucose-Capillary 116 (H) 70 - 99 mg/dL   Dg Chest Portable 1 View  Result Date: 12/24/2018 CLINICAL DATA:  Shortness of breath EXAM: PORTABLE CHEST 1 VIEW COMPARISON:  None. FINDINGS: Right-sided central venous catheter tip over the SVC. No pleural effusion. Moderate-to-marked bilateral interstitial, ground glass opacity and consolidations right greater than left. Normal heart size. No pneumothorax. IMPRESSION: Moderate-to-marked bilateral right greater than left interstitial and ground-glass opacity, which may reflect pulmonary edema or diffuse infection. Electronically Signed   By: Donavan Foil M.D.   On: 12/24/2018 23:47    Pending Labs Unresulted Labs (From admission, onward)    Start     Ordered   12/25/18 0500  CBC  Tomorrow morning,   R     12/25/18 0158   12/25/18 0500  Renal function panel  Tomorrow morning,   R     12/25/18 0158   12/25/18 0156  HIV antibody (Routine Testing)  Once,   R     12/25/18 0158          Vitals/Pain Today's Vitals   12/25/18 0215 12/25/18 0230 12/25/18 0245 12/25/18 0300  BP: (!) 142/92 (!) 141/93 136/88 134/86  Pulse: 97 100 100 (!) 101  Resp: (!) 0 (!) 0 10 17  Temp:      TempSrc:      SpO2: 97% 97% 97% 97%  Weight:      Height:      PainSc:        Isolation Precautions No active isolations  Medications Medications  acetaminophen (TYLENOL) tablet 650 mg (has no administration in time range)  aspirin EC tablet 81 mg (has no administration in time range)  amLODipine (NORVASC) tablet 5 mg (has no administration in time range)  atorvastatin (LIPITOR) tablet 80 mg (80 mg Oral Not Given 12/25/18 0237)  carvedilol (COREG) tablet 25 mg (has no administration in time range)  hydrALAZINE (APRESOLINE) tablet 25 mg (has no administration in time range)  hydrOXYzine (ATARAX/VISTARIL) tablet 25 mg (has no administration in time range)  gabapentin (NEURONTIN) capsule 300 mg (has no administration in time range)  b complex-vitamin c-folic acid (NEPHRO-VITE) tablet 1 tablet (has no administration in time range)  ondansetron (ZOFRAN) tablet 4 mg (has no administration in time range)    Or  ondansetron (ZOFRAN) injection 4 mg (has no administration in time range)  heparin injection 5,000 Units (has no administration in time range)  lidocaine (LIDODERM) 5 % 1 patch (has no administration in time range)  labetalol (NORMODYNE,TRANDATE) injection 10 mg (10 mg Intravenous Given 12/25/18 0128)    Mobility walks Moderate fall risk   Focused Assessments Renal Assessment Handoff:  Hemodialysis Schedule: Hemodialysis Schedule: Tuesday/Thursday/Saturday Last Hemodialysis date and time: saturday   Restricted appendage: HD cath R chest  , Pulmonary Assessment  Handoff:  Lung sounds: Bilateral Breath Sounds: Rhonchi L Breath Sounds: Rhonchi R Breath Sounds: Rhonchi O2 Device: Bi-PAP        R Recommendations: See Admitting Provider Note  Report given to:   Additional Notes: Pt on BiPAP, A + 0 x4

## 2018-12-25 NOTE — Consult Note (Signed)
Reason for Consult: ESRD Referring Physician:  Dr. Lalla Brothers  Chief Complaint:  Shortness of breath  Assessment/Plan: 1. ESRD  - Will dialyze this AM  - Floor scale weights and will challenge EDW as tolerated. - Will call his home unit outside of [facility] for further orders and history. - Vascular consult in the AM for permanent access placement. - Will need to be CLIP'd  As well. 2. Renal osteodystrophy - Will check a phos and iPTH for binder management. 3. Anemia - @ goal. Will check iron panels. No ESA for now. 4. 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 HTN - responding nicely to IV Labetolol and hydralazine. Restart home regimen and will dialyze this AM.   HPI: Brett Small is an 43 y.o. male ESRD TTS last HD was on Saturday outside of [facility], initiated on HD 0/1027 after complications w/ prior aortobifemoral bypass -> redo + started on Xarelto because of blood clots. He moved to [place] on Saturday bec of family circumstances not having yet established a place to dialyze. He actually presented to [facility] ED 12/23/2018 but refused admission at that time and did not receive dialysis. He is now presenting with worsening dyspnea + orthopnea found to be in pulmonary edema on CXR as well as on physical exam; he was also hypertensive with systolic >253'G but responded nicely to hydralazine and labetolol IV. He was started on BiPAP in the ED. He denies f/c/cough/ myalgias; he still makes some urine. He is currently dialyzing through a [facility] and was scheduled for access evaluation but moved prior to an access being placed.   ROS Pertinent items are noted in HPI. Has LBP chronically and is on tylenol, tramadol and oxycodone  Chemistry and CBC: Creatinine, Ser  Date/Time Value Ref Range Status  12/25/2018 12:08 AM 13.30 (H) 0.61 - 1.24 mg/dL Final  12/24/2018 11:55 PM 12.33 (H) 0.61 - 1.24 mg/dL Final  12/23/2018 10:48 AM 9.89 (H) 0.61 - 1.24 mg/dL Final    Recent Labs  Lab 12/23/18 1048 12/24/18 2355 12/25/18 0008  NA 140 141 139  K 4.7 4.6 4.7  CL 106 107  --   CO2 21* 16*  --   GLUCOSE 100* 123*  --   BUN 71* 82*  --   CREATININE 9.89* 12.33* 13.30*  CALCIUM 8.3* 8.1*  --    Recent Labs  Lab 12/23/18 1055 12/24/18 2355 12/25/18 0008  WBC 8.3 14.2*  --   NEUTROABS  --  12.4*  --   HGB 11.0* 12.3* 12.6*  HCT 34.9* 39.3 37.0*  MCV 91.6 90.8  --   PLT 185 214  --    Liver Function Tests: Recent Labs  Lab 12/24/18 2355  AST 18  ALT 17  ALKPHOS 67  BILITOT 0.6  PROT 7.1  ALBUMIN 3.5   No results for input(s): LIPASE, AMYLASE in the last 168 hours. No results for input(s): AMMONIA in the last 168 hours. Cardiac Enzymes: Recent Labs  Lab 12/24/18 2355  TROPONINI <0.03   Iron Studies: No results for input(s): IRON, TIBC, TRANSFERRIN, FERRITIN in the last 72 hours. PT/INR: @LABRCNTIP (inr:5)  Xrays/Other Studies: ) Results for orders placed or performed during the hospital encounter of 12/24/18 (from the past 48 hour(s))  CBC with Differential/Platelet     Status: Abnormal   Collection Time: 12/24/18 11:55 PM  Result Value Ref Range   WBC 14.2 (H) 4.0 - 10.5 K/uL   RBC 4.33 4.22 -  5.81 MIL/uL   Hemoglobin 12.3 (L) 13.0 - 17.0 g/dL   HCT 39.3 39.0 - 52.0 %   MCV 90.8 80.0 - 100.0 fL   MCH 28.4 26.0 - 34.0 pg   MCHC 31.3 30.0 - 36.0 g/dL   RDW 15.3 11.5 - 15.5 %   Platelets 214 150 - 400 K/uL   nRBC 0.0 0.0 - 0.2 %   Neutrophils Relative % 87 %   Neutro Abs 12.4 (H) 1.7 - 7.7 K/uL   Lymphocytes Relative 6 %   Lymphs Abs 0.8 0.7 - 4.0 K/uL   Monocytes Relative 4 %   Monocytes Absolute 0.6 0.1 - 1.0 K/uL   Eosinophils Relative 2 %   Eosinophils Absolute 0.3 0.0 - 0.5 K/uL   Basophils Relative 1 %   Basophils Absolute 0.1 0.0 - 0.1 K/uL   Immature Granulocytes 0 %   Abs Immature Granulocytes 0.05 0.00 - 0.07 K/uL    Comment: Performed at [facility] [address] [place], [place] [address]   Comprehensive metabolic panel     Status: Abnormal   Collection Time: 12/24/18 11:55 PM  Result Value Ref Range   Sodium 141 135 - 145 mmol/L   Potassium 4.6 3.5 - 5.1 mmol/L   Chloride 107 98 - 111 mmol/L   CO2 16 (L) 22 - 32 mmol/L   Glucose, Bld 123 (H) 70 - 99 mg/dL   BUN 82 (H) 6 - 20 mg/dL   Creatinine, Ser 12.33 (H) 0.61 - 1.24 mg/dL   Calcium 8.1 (L) 8.9 - 10.3 mg/dL   Total Protein 7.1 6.5 - 8.1 g/dL   Albumin 3.5 3.5 - 5.0 g/dL   AST 18 15 - 41 U/L   ALT 17 0 - 44 U/L   Alkaline Phosphatase 67 38 - 126 U/L   Total Bilirubin 0.6 0.3 - 1.2 mg/dL   GFR calc non Af Amer 4 (L) >60 mL/min   GFR calc Af Amer 5 (L) >60 mL/min   Anion gap 18 (H) 5 - 15    Comment: Performed at [facility], [place] [address], [place], [place] [address]  Brain natriuretic peptide     Status: Abnormal   Collection Time: 12/24/18 11:55 PM  Result Value Ref Range   B Natriuretic Peptide 2,318.1 (H) 0.0 - 100.0 pg/mL    Comment: Performed at [facility] [address] [place], [place] [address]  Troponin I - ONCE - STAT     Status: None   Collection Time: 12/24/18 11:55 PM  Result Value Ref Range   Troponin I <0.03 <0.03 ng/mL    Comment: Performed at [facility], [place] [address], [place], [place] [address]  I-stat Creatinine, ED     Status: Abnormal   Collection Time: 12/25/18 12:08 AM  Result Value Ref Range   Creatinine, Ser 13.30 (H) 0.61 - 1.24 mg/dL  POCT I-Stat EG7     Status: Abnormal   Collection Time: 12/25/18 12:08 AM  Result Value Ref Range   pH, Ven 7.363 7.250 - 7.430   pCO2, Ven 31.9 (L) 44.0 - 60.0 mmHg   pO2, Ven 167.0 (H) 32.0 - 45.0 mmHg   Bicarbonate 18.1 (L) 20.0 - 28.0 mmol/L   TCO2 19 (L) 22 - 32 mmol/L   O2 Saturation 99.0 %   Acid-base deficit 6.0 (H) 0.0 - 2.0 mmol/L   Sodium 139 135 - 145 mmol/L   Potassium 4.7 3.5 - 5.1 mmol/L  Calcium, Ion 0.91 (L) 1.15 - 1.40 mmol/L   HCT 37.0 (L) 39.0 - 52.0 %   Hemoglobin 12.6 (L) 13.0 - 17.0 g/dL    Patient temperature HIDE    Sample type VENOUS   CBG monitoring, ED     Status: Abnormal   Collection Time: 12/25/18 12:53 AM  Result Value Ref Range   Glucose-Capillary 116 (H) 70 - 99 mg/dL   Dg Chest Portable 1 View  Result Date: 12/24/2018 CLINICAL DATA:  Shortness of breath EXAM: PORTABLE CHEST 1 VIEW COMPARISON:  None. FINDINGS: Right-sided central venous catheter tip over the SVC. No pleural effusion. Moderate-to-marked bilateral interstitial, ground glass opacity and consolidations right greater than left. Normal heart size. No pneumothorax. IMPRESSION: Moderate-to-marked bilateral right greater than left interstitial and ground-glass opacity, which may reflect pulmonary edema or diffuse infection. Electronically Signed   By: Donavan Foil M.D.   On: 12/24/2018 23:47    PMH:   Past Medical History:  Diagnosis Date  . Hypertension   . 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 Codeine Nausea Only    Patient questioned this (??)  . Sulfa Antibiotics Hives and Nausea And Vomiting    Medications:   Prior to Admission medications   Medication Sig Start Date End Date Taking? Authorizing Provider  acetaminophen (TYLENOL) 325 MG tablet Take 650 mg by mouth every 6 (six) hours as needed for mild pain or headache.   Yes [provider]  amLODipine (NORVASC) 5 MG tablet Take 5 mg by mouth daily. hold if Systolic reading is <242   Yes [provider]  aspirin EC 81 MG tablet Take 81 mg by mouth daily.   Yes [provider]  atorvastatin (LIPITOR) 80 MG tablet Take 80 mg by mouth at bedtime.   Yes [provider]  B Complex-C-Folic Acid (NEPHRO-VITE PO) Take 1 tablet by mouth daily.   Yes [provider]  carvedilol (COREG) 25 MG tablet Take 25 mg by mouth 2 (two) times daily with a meal.   Yes [provider]  gabapentin  (NEURONTIN) 300 MG capsule Take 300 mg by mouth 3 (three) times daily.   Yes [provider]  hydrALAZINE (APRESOLINE) 25 MG tablet Take 25 mg by mouth every 8 (eight) hours.   Yes [provider]  hydrOXYzine (ATARAX/VISTARIL) 25 MG tablet Take 25 mg by mouth 3 (three) times daily as needed for anxiety.   Yes [provider]  rivaroxaban (XARELTO) 2.5 MG TABS tablet Take 2.5 mg by mouth 2 (two) times daily.   Yes [provider]    Discontinued Meds:   Medications Discontinued During This Encounter  Medication Reason  . aspirin (BAYER LOW DOSE) 81 MG EC tablet Change in therapy  . hydrALAZINE (APRESOLINE) injection 10 mg   . labetalol (NORMODYNE,TRANDATE) injection 20 mg   . rivaroxaban (XARELTO) tablet 2.5 mg     Social History:  reports that he has never smoked. He has never used smokeless tobacco. He reports previous alcohol use. He reports that he does not use drugs.  Family History:   Family History  Problem Relation Age of Onset  . Hypertension Other   . Diabetes Other   . Clotting disorder Father     Blood pressure 134/86, pulse (!) 101, temperature 98.6 F (37 C), temperature source Oral, resp. rate 17, height 5'  1" (1.549 m), weight 6.35 kg, SpO2 97 %. General appearance: alert, cooperative and appears stated age Head: Normocephalic, without obvious abnormality, atraumatic Eyes: negative Neck: no adenopathy, no carotid bruit, supple, symmetrical, trachea midline and thyroid not enlarged, symmetric, no tenderness/mass/nodules Back: symmetric, no curvature. ROM normal. No CVA tenderness. 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 No rashes or lesions Lymph nodes: Cervical,  supraclavicular, and axillary nodes normal. Neurologic: Grossly normal       Dema Timmons, Hunt Oris, MD 12/25/2018, 4:02 AM

## 2018-12-25 NOTE — ED Notes (Signed)
Report called to dialysis and patient taken to dialysis with this RN & RT.

## 2018-12-25 NOTE — Consult Note (Addendum)
[facility] NOTE   MRN : [id number]  Reason for Consult: ESRD Referring Physician: Dr. Augustin Coupe  History of Present Illness: 43 y/o male with ESRD who just moved to [place] from [place] on Saturday.  He is currently on HD via a temp catheter.  We have been asked to provide permanent access and exchange of the temp cath for [facility].  The right IJ temp cath was placed in Sept. 2019.  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  The wound has been present since 2012.   presenting with acute onset SOB in setting of missing dialysis. States that this evening at 9PM he began to feel [facility] and came to the ED.       Current Facility-Administered Medications  Medication Dose Route Frequency Provider Last Rate Last Dose  . 0.9 %  sodium chloride infusion  100 mL Intravenous PRN Dwana Melena, MD      . 0.9 %  sodium chloride infusion  100 mL Intravenous PRN Dwana Melena, MD      . acetaminophen (TYLENOL) tablet 650 mg  650 mg Oral Q6H PRN Ina Homes, MD      . alteplase (CATHFLO ACTIVASE) injection 2 mg  2 mg Intracatheter Once PRN Dwana Melena, MD      . amLODipine (NORVASC) tablet 5 mg  5 mg Oral Daily Ina Homes, MD   5 mg at 12/25/18 1355  . aspirin EC tablet 81 mg  81 mg Oral Daily Ina Homes, MD   81 mg at 12/25/18 1355  . atorvastatin (LIPITOR) tablet 80 mg  80 mg Oral QHS Helberg, Justin, MD      . carvedilol (COREG) tablet 25 mg  25 mg Oral BID WC Helberg, Justin, MD   25 mg at 12/25/18 1401  . Chlorhexidine Gluconate Cloth 2 % PADS 6 each  6 each Topical Q0600 Dwana Melena, MD   Stopped at 12/25/18 0630  . gabapentin (NEURONTIN) capsule 300 mg  300 mg Oral TID Ina Homes, MD   300 mg at 12/25/18 1354  . heparin injection 1,000  Units  1,000 Units Dialysis PRN Dwana Melena, MD      . heparin injection 5,000 Units  5,000 Units Subcutaneous Q8H Ina Homes, MD   5,000 Units at 12/25/18 1354  . hydrALAZINE (APRESOLINE) tablet 25 mg  25 mg Oral Q8H Helberg, Justin, MD   25 mg at 12/25/18 1355  . hydrOXYzine (ATARAX/VISTARIL) tablet 25 mg  25 mg Oral TID PRN Ina Homes, MD      . lidocaine (LIDODERM) 5 % 1 patch  1 patch Transdermal Q24H Isabelle Course, MD   Stopped at 12/25/18 [id number]  . lidocaine (PF) (XYLOCAINE) 1 % injection 5 mL  5 mL Intradermal PRN Dwana Melena, MD      . lidocaine-prilocaine (EMLA) cream 1 application  1 application Topical PRN Dwana Melena, MD      . multivitamin (RENA-VIT) tablet 1 tablet  1 tablet Oral QHS Helberg, Larkin Ina, MD      . ondansetron (ZOFRAN) tablet 4 mg  4 mg Oral Q6H PRN Ina Homes, MD       Or  . ondansetron (ZOFRAN) injection 4 mg  4 mg Intravenous Q6H PRN  Ina Homes, MD      . pentafluoroprop-tetrafluoroeth Landry Dyke) aerosol 1 application  1 application Topical PRN Dwana Melena, MD        Pt meds include: Statin :Yes Betablocker: Yes ASA: Yes Other anticoagulants/antiplatelets: Xarelto currently on hold  Past Medical History:  Diagnosis Date  . Hypertension   . Renal disorder       Social History Social History   Tobacco Use  . Smoking status: Former Smoker    Last attempt to quit: 06/26/2018    Years since quitting: 0.4  . Smokeless tobacco: Never Used  . Tobacco comment: smoked for 30 years   Substance Use Topics  . Alcohol use: Not Currently  . Drug use: Never    Family History Family History  Problem Relation Age of Onset  . Hypertension Other   . Diabetes Other   . Clotting disorder Father     Allergies  Allergen Reactions  . Codeine Nausea Only    Patient questioned this (??)  . Sulfa Antibiotics Hives and Nausea And Vomiting     REVIEW OF SYSTEMS  General: [ ]  Weight loss, [ ]  Fever, [ ]  chills Neurologic: [ ]  Dizziness,  [ ]  Blackouts, [ ]  Seizure [ ]  Stroke, [ ]  "Mini stroke", [ ]  Slurred speech, [ ]  Temporary blindness; [ ]  weakness in arms or legs, [ ]  Hoarseness [ ]  Dysphagia Cardiac: [ ]  Chest pain/pressure, [ ]  Shortness of breath at rest [ ]  Shortness of breath with exertion, [ ]  Atrial fibrillation or irregular heartbeat  Vascular: [ ]  Pain in legs with walking, [ ]  Pain in legs at rest, [ ]  Pain in legs at night,  [x ] Non-healing ulcer, [x  Hx of] Blood clot in vein/DVT,   Pulmonary: [ ]  Home oxygen, [ ]  Productive cough, [ ]  Coughing up blood, [ ]  Asthma,  [ ]  Wheezing [ ]  COPD Musculoskeletal:  [ ]  Arthritis, [ ]  Low back pain, [ ]  Joint pain Hematologic: [ ]  Easy Bruising, [ ]  Anemia; [ ]  Hepatitis Gastrointestinal: [ ]  Blood in stool, [ ]  Gastroesophageal Reflux/heartburn, Urinary: [ ]  chronic Kidney disease, [x ] on HD - [ ]  MWF or x[ ]  TTHS, [ ]  Burning with urination, [ ]  Difficulty urinating Skin: [ ]  Rashes, [ ]  Wounds Psychological: [ ]  Anxiety, [ ]  Depression  Physical Examination Vitals:   12/25/18 1130 12/25/18 1200 12/25/18 1230 12/25/18 1353  BP: (!) 162/98 (!) 168/106 (!) 159/96 (!) 175/108  Pulse: (!) 101 100 (!) 102 100  Resp: 15 16 20 18   Temp:    97.8 F (36.6 C)  TempSrc:    Oral  SpO2:    100%  Weight:    48.3 kg  Height:    5\' 1"  (1.549 m)   Body mass index is 20.1 kg/m.  General:  WDWN in NAD Gait: Normal HENT: WNL Eyes: Pupils equal Pulmonary: normal non-labored breathing , without Rales, rhonchi,  wheezing Cardiac: RRR, without  Murmurs, rubs or gallops; Abdomen: soft, NT, no masses Well healed abdominal, supra iliac and B groin incisions.  Vascular Exam/Pulses:Palpable radial pulses B, femoral, popliteal and right PT palpable Right lateral foot/ankle erythema with opening, no malodor and no active drainage.    Musculoskeletal: no muscle wasting or atrophy; no edema  Neurologic: A&O X 3; Appropriate Affect ;  SENSATION: normal; MOTOR FUNCTION: 5/5  Symmetric Speech is fluent/normal   Significant Diagnostic Studies: CBC Lab Results  Component Value Date  WBC 12.1 (H) 12/25/2018   HGB 9.9 (L) 12/25/2018   HCT 31.2 (L) 12/25/2018   MCV 90.2 12/25/2018   PLT 191 12/25/2018    BMET    Component Value Date/Time   NA 142 12/25/2018 0413   K 4.4 12/25/2018 0413   CL 106 12/25/2018 0413   CO2 15 (L) 12/25/2018 0413   GLUCOSE 112 (H) 12/25/2018 0413   BUN 84 (H) 12/25/2018 0413   CREATININE 12.16 (H) 12/25/2018 0413   CALCIUM 7.8 (L) 12/25/2018 0413   GFRNONAA 4 (L) 12/25/2018 0413   GFRAA 5 (L) 12/25/2018 0413   Estimated Creatinine Clearance: 5.3 mL/min (A) (by C-G formula based on SCr of 12.16 mg/dL (H)).  COAG No results found for: INR, PROTIME   Non-Invasive Vascular Imaging:  Pending vein mapping  ASSESSMENT/PLAN:  ESRD currently on HD with [facility] DVT? History managed with Xarelto, currently on hold. Will plan left AV fistula verses graft with Dr. Carlis Abbott tomorrow.  NPO.   Roxy Horseman 12/25/2018 3:27 PM   I have interviewed and examined patient with PA and agree with assessment and plan above. Plan for left arm avf vs graft tomorrow. Vein mapping pending.  Amelio Brosky C. Donzetta Matters, MD [facility] [place] Office: [phone] Pager: [phone]

## 2018-12-25 NOTE — Progress Notes (Signed)
Upper extremity vein mapping has been completed.   Preliminary results in CV Proc.   Abram Sander 12/25/2018 3:31 PM

## 2018-12-25 NOTE — Progress Notes (Signed)
RT placed pt on servo I NIV 12/6, FIO2 of 50%. Pt tolerating well.RT instructed by ED MD that an ABG was not necessary on this pt. RT will continue to monitor

## 2018-12-25 NOTE — Progress Notes (Signed)
Pt transported to HD on bipap w/ no apparent complications. Floor RT aware.

## 2018-12-25 NOTE — H&P (Signed)
Date: 12/25/2018               Patient Name:  Brett Small MRN: [id number]  DOB: 08-01-1976 Age / Sex: 43 y.o., male   PCP: Patient, [name]         Medical Service: Internal Medicine Teaching Service         Attending Physician: Dr. Evette Doffing, Mallie Mussel, *    First Contact: Dr. Laural Golden Pager: [phone]  Second Contact: Dr. 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 States that this evening at 9PM he began to feel [facility] and came to the ED. He had presented to the ED of 12/23/2018 because he knew he needed HD but left after he was unable to get HD and did not want to be admitted. In addition to the acute onset SHOB he is having some N/V and states that he has vomited >10 times. His N/V has improved since getting to the ED. He also has some lower back pain. This is chronic and he has taken tylenol, tramadol and oxycodone for it. He denies fevers, chills, headaches, abdominal pain, diarrhea, constipation, new myalgias/arthralgias, sick contact. He endorses some LE numbness and tingling that is also chronic in nature.   From a nephrology standpoint. The patient has been on HD since September of 2355 after complications with his aortobifemoral bypass (occlusion). He has a right IJ HD catheter. He has been evaluated for a fistula but unfortunately was unable to follow-up with vascular. He still produces some urine. His last HD was Saturday, Feb 29th.   He previously lived in [place] but moved to [place] on Saturday to be closer to family so he will need  to establish with a PCP, nephrology, an HD center, and vascular surgery.   Meds:  Current Meds  Medication Sig  . acetaminophen (TYLENOL) 325 MG tablet Take 650 mg by mouth every 6 (six) hours as needed for mild pain or headache.  Marland Kitchen amLODipine (NORVASC) 5 MG tablet Take 5 mg by mouth daily. hold if Systolic reading is <732  . aspirin EC 81 MG tablet Take 81 mg by mouth daily.  Marland Kitchen atorvastatin (LIPITOR) 80 MG tablet Take 80 mg by mouth at bedtime.  . B Complex-C-Folic Acid (NEPHRO-VITE PO) Take 1 tablet by mouth daily.  . carvedilol (COREG) 25 MG tablet Take 25 mg by mouth 2 (two) times daily with a meal.  . gabapentin (NEURONTIN) 300 MG capsule Take 300 mg by mouth 3 (three) times daily.  . hydrALAZINE (APRESOLINE) 25 MG tablet Take 25 mg by mouth every 8 (eight) hours.  . hydrOXYzine (ATARAX/VISTARIL) 25 MG tablet Take 25 mg by mouth 3 (three) times daily as needed for anxiety.  . rivaroxaban (XARELTO) 2.5 MG TABS tablet Take 2.5 mg by mouth 2 (two) times daily.   Allergies: Allergies as of 12/24/2018 - Review Complete 12/23/2018  Allergen Reaction Noted  . Codeine Nausea Only 09/22/2018  . Sulfa antibiotics Hives and Nausea And Vomiting 08/29/2018   Past Medical History:  Diagnosis Date  . Hypertension   . Renal disorder    Family History  Problem Relation Age of Onset  . Hypertension Other   . Diabetes Other   . Clotting disorder Father   Denies family history of blood clots, father had a heart attack at age 59  Social History: He has been disabled since Sept 2019. Previously worked as a Development worker, community. Occasionally smokes. Denies the use of other illicit substances. No alcohol use. Recently moved from [place] to [place] to be closer to family   Review of Systems: A complete ROS was negative except as per HPI.   Physical Exam: Blood pressure (!) 159/106, pulse (!) 103, temperature 98.6 F (37 C), temperature source Oral, resp. rate 14, height 5\' 1"  (1.549 m), weight 6.35 kg, SpO2 97  %. Vitals:   12/25/18 0045 12/25/18 0100 12/25/18 0115 12/25/18 0130  BP: (!) 165/110 (!) 163/108 (!) 169/111 (!) 159/106  Pulse: (!) 106 (!) 108 (!) 107 (!) 103  Resp: 15 (!) 24 19 14   Temp:      TempSrc:      SpO2: 98% 97% 98% 97%  Weight:      Height:       General: Vital signs reviewed.  Patient is well-developed and well-nourished, in no acute distress and cooperative with exam.  Head: Normocephalic and atraumatic. Eyes: EOMI, conjunctivae normal, no scleral icterus.  Neck: Supple, trachea midline, normal ROM Cardiovascular: tachycardic, regular rhythm, S1 normal, S2 normal, no murmurs, gallops, or rubs. Pulmonary/Chest: On bipap, normal wob, decreased breath sounds at bilateral bases with crackles bilaterally up to the mid lung.  Abdominal: Soft, non-tender, non-distended, BS + Musculoskeletal: No joint deformities, erythema, or stiffness, ROM full and nontender. Extremities: Upper and lower extremities are warm. Strong radial pulses. DP pulses are difficult to appreciate. No lower extremity edema bilaterally. No cyanosis or clubbing. Several well healed scars on bilateral lower extremities from previous vascular surgeries Neurological: A&O x3, Strength is normal and symmetric bilaterally, cranial nerve II-XII are grossly intact, no focal motor deficit, sensory intact to light touch bilaterally.  Skin: Warm, dry and intact. No rashes or erythema. Psychiatric: Normal mood and affect. speech and behavior is normal. Cognition and memory are normal.   Labs: VBG: pH 7.36, pCO2 31.9, bicarb 18 CMP: Na 141, K 4.6, co2 16, BUN 82, creatine 12.33, AG 18 BNP 2,318 Trop < 0.03 CBC: wbc 14  EKG: personally reviewed my interpretation is sinus tach HR 120, no t wave inversions or st elevations  CXR: personally reviewed my interpretation is bilateral pulmonary edema  Assessment & Plan by Problem: Active Problems:   Acute respiratory failure with hypoxia ([place])  42yoM on Xarelto for h/o  blood clots and with ESRD on HD TThSa via temporary right IJ catheter since Sept 2019, HTN, HFrEF (EF 45-50%), PVD s/p aortobifemoral bypass w/ redo after closure and 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   Acute respiratory failure with hypoxia 2/2 ESRD on HD, HFrEF: Initial O2 in the 80s on RA with BP 183/117. Received IV hydralazine and IV labetalol for HTN. CXR with significant pulmonary edema, BNP elevated. Now on bipap and appears comfortable. Symptoms most likely due to missed HD session (last HD was Feb 29th). He has a temporary right IJ catheter. Had outpt appt for AVG evaluation but had to cancel. His previous vascular surgeon wanted him to continue rivaroxaban even though he is ESRD.  -  nephrology has been consulted, he will go for HD later this morning. 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 His previous vascular surgeon wanted him to continue rivaroxaban even though he is ESRD.  - hold rivaroxaban - continue home asa 81mg  - continue home gabapentin - clarify rivaroxaban with vascular surgery in the morning  HTN: home meds include amlodipine 5mg  qd, coreg 25mg  bid, hydralazine 25mg  q8h. He has not taken his evening meds and is hypertensive, although improved after receiving IV hydralazine and labetalol - continue home meds  Chronic back pain: reports taking tylenol, tramadol, and oxycodone. The narcotic database does not show any regular prescriptions for these medications. He received a 7 day supply of oxycodone in Jan and 5 day supply of tramadol in Dec. Currently endorses 8 out of 10 pain. Neurologically intact.  - tylenol, lidocaine patch, k pad   Anxiety: continue home hydroxyzine tid prn  FEN/GI: npo while  on bipap, no IVFs Code: Partial (no CPR, meds, or shocks. He is ok with intubation) DVT ppx: [place] heparin   Dispo: Admit patient to Inpatient with expected length of stay greater than 2 midnights.  Signed: Isabelle Course, MD 12/25/2018, 2:03 AM  Pager: [phone]

## 2018-12-26 ENCOUNTER — Inpatient Hospital Stay (HOSPITAL_COMMUNITY): Payer: Medicare Other | Admitting: Anesthesiology

## 2018-12-26 ENCOUNTER — Encounter (HOSPITAL_COMMUNITY): Payer: Self-pay | Admitting: Anesthesiology

## 2018-12-26 ENCOUNTER — Encounter (HOSPITAL_COMMUNITY)
Admission: EM | Disposition: A | Discharge status: Other Acute Inpatient Hospital | Payer: Self-pay | Source: Home / Self Care | Attending: Student in an Organized Health Care Education/Training Program

## 2018-12-26 DIAGNOSIS — Z992 Dependence on renal dialysis: Secondary | ICD-10-CM

## 2018-12-26 DIAGNOSIS — M549 Dorsalgia, unspecified: Secondary | ICD-10-CM

## 2018-12-26 DIAGNOSIS — N186 End stage renal disease: Secondary | ICD-10-CM

## 2018-12-26 HISTORY — PX: AV FISTULA PLACEMENT: SHX1204

## 2018-12-26 LAB — CBC
HEMATOCRIT: 31.3 % — AB (ref 39.0–52.0)
Hemoglobin: 9.9 g/dL — ABNORMAL LOW (ref 13.0–17.0)
MCH: 28 pg (ref 26.0–34.0)
MCHC: 31.6 g/dL (ref 30.0–36.0)
MCV: 88.7 fL (ref 80.0–100.0)
PLATELETS: 123 10*3/uL — AB (ref 150–400)
RBC: 3.53 MIL/uL — ABNORMAL LOW (ref 4.22–5.81)
RDW: 15.1 % (ref 11.5–15.5)
WBC: 8.6 10*3/uL (ref 4.0–10.5)
nRBC: 0 % (ref 0.0–0.2)

## 2018-12-26 LAB — HEPATITIS C ANTIBODY: HCV Ab: <0.1 s/co ratio (ref 0.0–0.9)

## 2018-12-26 LAB — BASIC METABOLIC PANEL
Anion gap: 13 (ref 5–15)
BUN: 36 mg/dL — ABNORMAL HIGH (ref 6–20)
CO2: 24 mmol/L (ref 22–32)
Calcium: 8.2 mg/dL — ABNORMAL LOW (ref 8.9–10.3)
Chloride: 101 mmol/L (ref 98–111)
Creatinine, Ser: 6.58 mg/dL — ABNORMAL HIGH (ref 0.61–1.24)
GFR calc non Af Amer: 9 mL/min — ABNORMAL LOW (ref >60)
GFR, EST AFRICAN AMERICAN: 11 mL/min — AB (ref >60)
Glucose, Bld: 88 mg/dL (ref 70–99)
Potassium: 4 mmol/L (ref 3.5–5.1)
Sodium: 138 mmol/L (ref 135–145)

## 2018-12-26 LAB — PROTIME-INR
INR: 1.1 (ref 0.8–1.2)
Prothrombin Time: 14 seconds (ref 11.4–15.2)

## 2018-12-26 LAB — HEPATITIS B CORE ANTIBODY, TOTAL: Hep B Core Total Ab: NEGATIVE

## 2018-12-26 LAB — HEPATITIS B SURFACE ANTIBODY,QUALITATIVE: Hep B S Ab: NONREACTIVE

## 2018-12-26 LAB — VITAMIN D 25 HYDROXY (VIT D DEFICIENCY, FRACTURES): Vit D, 25-Hydroxy: 14.1 ng/mL — ABNORMAL LOW (ref 30.0–100.0)

## 2018-12-26 LAB — HEPATITIS B SURFACE ANTIGEN: Hepatitis B Surface Ag: NEGATIVE

## 2018-12-26 LAB — PARATHYROID HORMONE, INTACT (NO CA): PTH: 81 pg/mL — ABNORMAL HIGH (ref 15–65)

## 2018-12-26 SURGERY — ARTERIOVENOUS (AV) FISTULA CREATION
Anesthesia: Monitor Anesthesia Care | Site: Arm Upper | Laterality: Left

## 2018-12-26 MED ORDER — SODIUM CHLORIDE 0.9 % IV SOLN: INTRAVENOUS | Status: DC | PRN

## 2018-12-26 MED ORDER — 0.9 % SODIUM CHLORIDE (POUR BTL) OPTIME
TOPICAL | Status: DC | PRN
  Administered 2018-12-26: 1000 mL

## 2018-12-26 MED ORDER — HEPARIN SODIUM (PORCINE) 1000 UNIT/ML IJ SOLN
INTRAMUSCULAR | Status: DC | PRN
  Administered 2018-12-26: 3000 [IU] via INTRAVENOUS

## 2018-12-26 MED ORDER — MIDAZOLAM HCL 2 MG/2ML IJ SOLN
INTRAMUSCULAR | Status: AC
  Filled 2018-12-26: qty 2

## 2018-12-26 MED ORDER — FENTANYL CITRATE (PF) 250 MCG/5ML IJ SOLN
INTRAMUSCULAR | Status: AC
  Filled 2018-12-26: qty 5

## 2018-12-26 MED ORDER — SODIUM CHLORIDE 0.9 % IV SOLN
INTRAVENOUS | Status: DC
  Administered 2018-12-26 – 2019-01-01 (×2): via INTRAVENOUS
  Administered 2019-01-01: 10 mL via INTRAVENOUS
  Administered 2019-01-02: 12:00:00 via INTRAVENOUS

## 2018-12-26 MED ORDER — ONDANSETRON HCL 4 MG/2ML IJ SOLN: 4.0000 mg | Freq: Once | INTRAMUSCULAR | Status: DC | PRN

## 2018-12-26 MED ORDER — LIDOCAINE 2% (20 MG/ML) 5 ML SYRINGE
INTRAMUSCULAR | Status: AC
  Filled 2018-12-26: qty 10

## 2018-12-26 MED ORDER — SODIUM CHLORIDE 0.9 % IV SOLN
INTRAVENOUS | Status: AC
  Filled 2018-12-26: qty 1.2

## 2018-12-26 MED ORDER — ONDANSETRON HCL 4 MG/2ML IJ SOLN
INTRAMUSCULAR | Status: AC
  Filled 2018-12-26: qty 2

## 2018-12-26 MED ORDER — PROPOFOL 10 MG/ML IV BOLUS
INTRAVENOUS | Status: AC
  Filled 2018-12-26: qty 20

## 2018-12-26 MED ORDER — OXYCODONE HCL 5 MG PO TABS
5.0000 mg | ORAL_TABLET | Freq: Four times a day (QID) | ORAL | Status: DC | PRN
  Administered 2018-12-26 – 2019-01-07 (×37): 5 mg via ORAL
  Filled 2018-12-26 (×37): qty 1

## 2018-12-26 MED ORDER — FENTANYL CITRATE (PF) 100 MCG/2ML IJ SOLN: 25.0000 ug | INTRAMUSCULAR | Status: DC | PRN

## 2018-12-26 MED ORDER — DEXAMETHASONE SODIUM PHOSPHATE 10 MG/ML IJ SOLN
INTRAMUSCULAR | Status: AC
  Filled 2018-12-26: qty 1

## 2018-12-26 MED ORDER — PHENYLEPHRINE 40 MCG/ML (10ML) SYRINGE FOR IV PUSH (FOR BLOOD PRESSURE SUPPORT)
PREFILLED_SYRINGE | INTRAVENOUS | Status: AC
  Filled 2018-12-26: qty 20

## 2018-12-26 MED ORDER — LIDOCAINE 2% (20 MG/ML) 5 ML SYRINGE
INTRAMUSCULAR | Status: DC | PRN
  Administered 2018-12-26: 30 mg via INTRAVENOUS

## 2018-12-26 MED ORDER — PHENYLEPHRINE 40 MCG/ML (10ML) SYRINGE FOR IV PUSH (FOR BLOOD PRESSURE SUPPORT)
PREFILLED_SYRINGE | INTRAVENOUS | Status: DC | PRN
  Administered 2018-12-26 (×2): 60 ug via INTRAVENOUS

## 2018-12-26 MED ORDER — PROPOFOL 10 MG/ML IV BOLUS
INTRAVENOUS | Status: DC | PRN
  Administered 2018-12-26: 11 mg via INTRAVENOUS
  Administered 2018-12-26 (×2): 15 mg via INTRAVENOUS

## 2018-12-26 MED ORDER — MIDAZOLAM HCL 5 MG/5ML IJ SOLN
INTRAMUSCULAR | Status: DC | PRN
  Administered 2018-12-26: 2 mg via INTRAVENOUS

## 2018-12-26 MED ORDER — LIDOCAINE HCL (PF) 1 % IJ SOLN
INTRAMUSCULAR | Status: AC
  Filled 2018-12-26: qty 30

## 2018-12-26 MED ORDER — LIDOCAINE 2% (20 MG/ML) 5 ML SYRINGE
INTRAMUSCULAR | Status: AC
  Filled 2018-12-26: qty 5

## 2018-12-26 MED ORDER — FENTANYL CITRATE (PF) 250 MCG/5ML IJ SOLN
INTRAMUSCULAR | Status: DC | PRN
  Administered 2018-12-26: 25 ug via INTRAVENOUS
  Administered 2018-12-26 (×2): 50 ug via INTRAVENOUS

## 2018-12-26 MED ORDER — EPHEDRINE 5 MG/ML INJ
INTRAVENOUS | Status: AC
  Filled 2018-12-26: qty 10

## 2018-12-26 MED ORDER — PROPOFOL 500 MG/50ML IV EMUL
INTRAVENOUS | Status: DC | PRN
  Administered 2018-12-26: 75 ug/kg/min via INTRAVENOUS

## 2018-12-26 MED ORDER — LIDOCAINE HCL 1 % IJ SOLN
INTRAMUSCULAR | Status: DC | PRN
  Administered 2018-12-26: 30 mL

## 2018-12-26 MED ORDER — SODIUM CHLORIDE 0.9 % IV SOLN
INTRAVENOUS | Status: DC | PRN
  Administered 2018-12-26: 10:00:00

## 2018-12-26 SURGICAL SUPPLY — 44 items
ARMBAND PINK RESTRICT EXTREMIT (MISCELLANEOUS) ×3 IMPLANT
CANISTER SUCT 3000ML PPV (MISCELLANEOUS) ×3 IMPLANT
CLIP VESOCCLUDE MED 6/CT (CLIP) ×3 IMPLANT
CLIP VESOCCLUDE SM WIDE 6/CT (CLIP) ×6 IMPLANT
COVER PROBE W GEL 5X96 (DRAPES) ×3 IMPLANT
COVER WAND RF STERILE (DRAPES) ×3 IMPLANT
DECANTER SPIKE VIAL GLASS SM (MISCELLANEOUS) ×3 IMPLANT
DERMABOND ADHESIVE PROPEN (GAUZE/BANDAGES/DRESSINGS) ×4
DERMABOND ADVANCED (GAUZE/BANDAGES/DRESSINGS) ×2
DERMABOND ADVANCED .7 DNX12 (GAUZE/BANDAGES/DRESSINGS) ×1 IMPLANT
DERMABOND ADVANCED .7 DNX6 (GAUZE/BANDAGES/DRESSINGS) ×2 IMPLANT
ELECT REM PT RETURN 9FT ADLT (ELECTROSURGICAL) ×3
ELECTRODE REM PT RTRN 9FT ADLT (ELECTROSURGICAL) ×1 IMPLANT
GLOVE BIO SURGEON STRL SZ 6.5 (GLOVE) ×4 IMPLANT
GLOVE BIO SURGEON STRL SZ7.5 (GLOVE) ×3 IMPLANT
GLOVE BIO SURGEONS STRL SZ 6.5 (GLOVE) ×2
GLOVE BIOGEL PI IND STRL 7.0 (GLOVE) ×1 IMPLANT
GLOVE BIOGEL PI IND STRL 7.5 (GLOVE) ×1 IMPLANT
GLOVE BIOGEL PI IND STRL 8 (GLOVE) ×1 IMPLANT
GLOVE BIOGEL PI INDICATOR 7.0 (GLOVE) ×2
GLOVE BIOGEL PI INDICATOR 7.5 (GLOVE) ×2
GLOVE BIOGEL PI INDICATOR 8 (GLOVE) ×2
GOWN STRL NON-REIN LRG LVL3 (GOWN DISPOSABLE) ×3 IMPLANT
GOWN STRL REUS W/ TWL LRG LVL3 (GOWN DISPOSABLE) ×2 IMPLANT
GOWN STRL REUS W/ TWL XL LVL3 (GOWN DISPOSABLE) ×2 IMPLANT
GOWN STRL REUS W/TWL LRG LVL3 (GOWN DISPOSABLE) ×4
GOWN STRL REUS W/TWL XL LVL3 (GOWN DISPOSABLE) ×4
GRAFT GORETEX STRT 4-7X45 (Vascular Products) ×3 IMPLANT
HEMOSTAT SPONGE AVITENE ULTRA (HEMOSTASIS) IMPLANT
KIT BASIN OR (CUSTOM PROCEDURE TRAY) ×3 IMPLANT
KIT TURNOVER KIT B (KITS) ×3 IMPLANT
LOOP VESSEL MINI RED (MISCELLANEOUS) ×3 IMPLANT
NS IRRIG 1000ML POUR BTL (IV SOLUTION) ×3 IMPLANT
PACK CV ACCESS (CUSTOM PROCEDURE TRAY) ×3 IMPLANT
PAD ARMBOARD 7.5X6 YLW CONV (MISCELLANEOUS) ×6 IMPLANT
SUT MNCRL AB 4-0 PS2 18 (SUTURE) ×6 IMPLANT
SUT PROLENE 6 0 BV (SUTURE) ×6 IMPLANT
SUT PROLENE 6 0 CC (SUTURE) ×3 IMPLANT
SUT PROLENE 7 0 BV 1 (SUTURE) IMPLANT
SUT VIC AB 3-0 SH 27 (SUTURE) ×4
SUT VIC AB 3-0 SH 27X BRD (SUTURE) ×2 IMPLANT
TOWEL GREEN STERILE (TOWEL DISPOSABLE) ×3 IMPLANT
UNDERPAD 30X30 (UNDERPADS AND DIAPERS) ×3 IMPLANT
WATER STERILE IRR 1000ML POUR (IV SOLUTION) ×3 IMPLANT

## 2018-12-26 NOTE — Transfer of Care (Signed)
Immediate Anesthesia Transfer of Care Note  Patient: Brett Small  Procedure(s) Performed: INSERTION OF GORE STRETCH VASCULAR 4-7MM X  45CM IN LEFT UPPER ARM (Left Arm Upper)  Patient Location: PACU  Anesthesia Type:MAC  Level of Consciousness: awake, alert  and oriented  Airway & Oxygen Therapy: Patient Spontanous Breathing  Post-op Assessment: Report given to RN, Post -op Vital signs reviewed and stable and Patient moving all extremities X 4  Post vital signs: Reviewed and stable  Last Vitals:  Vitals Value Taken Time  BP 120/81 12/26/2018 11:22 AM  Temp    Pulse 74 12/26/2018 11:31 AM  Resp 14 12/26/2018 11:31 AM  SpO2 98 % 12/26/2018 11:31 AM  Vitals shown include unvalidated device data.  Last Pain:  Vitals:   12/26/18 0616  TempSrc: Oral  PainSc:          Complications: No apparent anesthesia complications

## 2018-12-26 NOTE — Anesthesia Postprocedure Evaluation (Signed)
Anesthesia Post Note  Patient: Brett Small  Procedure(s) Performed: INSERTION OF GORE STRETCH VASCULAR 4-7MM X  45CM IN LEFT UPPER ARM (Left Arm Upper)     Patient location during evaluation: PACU Anesthesia Type: MAC Level of consciousness: awake and alert and oriented Pain management: pain level controlled Vital Signs Assessment: post-procedure vital signs reviewed and stable Respiratory status: spontaneous breathing, nonlabored ventilation and respiratory function stable Cardiovascular status: stable and blood pressure returned to baseline Postop Assessment: no apparent nausea or vomiting Anesthetic complications: no    Last Vitals:  Vitals:   12/26/18 1140 12/26/18 1155  BP: 117/78 111/70  Pulse: 74 77  Resp: 13 (!) 8  Temp:  36.5 C  SpO2: 95% 92%    Last Pain:  Vitals:   12/26/18 1155  TempSrc:   PainSc: 0-No pain                 Cythina Mickelsen A.

## 2018-12-26 NOTE — Progress Notes (Signed)
[facility] of   Subjective  - No complaints   Objective 121/66 80 98.1 F (36.7 C) (Oral) 16 98%  Intake/Output Summary (Last 24 hours) at 12/26/2018 0903 Last data filed at 12/25/2018 2100 Gross per 24 hour  Intake 444 ml  Output 3000 ml  Net -2556 ml    Palpable radial pulses bilaterally  Laboratory Lab Results: Recent Labs    12/25/18 0413 12/26/18 0305  WBC 12.1* 8.6  HGB 9.9* 9.9*  HCT 31.2* 31.3*  PLT 191 123*   BMET Recent Labs    12/25/18 0413 12/26/18 0305  NA 142 138  K 4.4 4.0  CL 106 101  CO2 15* 24  GLUCOSE 112* 88  BUN 84* 36*  CREATININE 12.16* 6.58*  CALCIUM 7.8* 8.2*    COAG Lab Results  Component Value Date   INR 1.1 12/26/2018   No results found for: PTT  Assessment/Planning:  Plan for left arm AVF vs graft.    Marty Heck 12/26/2018 9:03 AM --

## 2018-12-26 NOTE — Anesthesia Preprocedure Evaluation (Addendum)
Anesthesia Evaluation  Patient identified by MRN, date of birth, ID band Patient awake    Reviewed: NPO status , Patient's Chart, lab work & pertinent test results, reviewed documented beta blocker date and time   Airway Mallampati: II  TM Distance: >3 FB Neck ROM: Full    Dental  (+) Edentulous Upper, Edentulous Lower   Pulmonary former smoker,    Pulmonary exam normal breath sounds clear to auscultation       Cardiovascular hypertension, Pt. on medications and Pt. on home beta blockers + Peripheral Vascular Disease  Normal cardiovascular exam Rhythm:Regular Rate:Normal     Neuro/Psych negative neurological ROS  negative psych ROS   GI/Hepatic negative GI ROS, Neg liver ROS,   Endo/Other  negative endocrine ROS  Renal/GU Dialysis and ESRFRenal diseaseRenal osteodystrophy Last dialysis yesterday  negative genitourinary   Musculoskeletal negative musculoskeletal ROS (+)   Abdominal   Peds  Hematology  (+) anemia , Xarelto therapy- last dose 12/24/2018 Thrombocytopenia   Anesthesia Other Findings   Reproductive/Obstetrics                            Anesthesia Physical Anesthesia Plan  ASA: IV  Anesthesia Plan: MAC   Post-op Pain Management:    Induction: Intravenous  PONV Risk Score and Plan: 1 and Ondansetron, Propofol infusion and Treatment may vary due to age or medical condition  Airway Management Planned: Natural Airway, Nasal Cannula and Simple Face Mask  Additional Equipment:   Intra-op Plan:   Post-operative Plan:   Informed Consent: I have reviewed the patients History and Physical, chart, labs and discussed the procedure including the risks, benefits and alternatives for the proposed anesthesia with the patient or authorized representative who has indicated his/her understanding and acceptance.     Dental advisory given  Plan Discussed with: CRNA and  Surgeon  Anesthesia Plan Comments:         Anesthesia Quick Evaluation

## 2018-12-26 NOTE — Op Note (Signed)
OPERATIVE NOTE   PROCEDURE:  left upper arm (4 mm x 7 mm) arteriovenous graft   PRE-OPERATIVE DIAGNOSIS: end stage renal disease   POST-OPERATIVE DIAGNOSIS: same  SURGEON: Marty Heck, MD  ASSISTANT(S): Leontine Locket, PA  ANESTHESIA: MAC  ESTIMATED BLOOD LOSS: Minimal  FINDING(S): 1.  Cephalic vein in the mid upper arm was only about 1 mm and smaller than vein mapping suggested.  The paired brachial veins at the antecubitum were also very small and I did not think a loop graft in the forearm would be viable.  As a result placed left upper arm AV graft sewn to the larger of two deep brachial veins that was only about 2.5 mm. 2.  Palpable thrill at end of case 3.  Dopplerable radial signal at end of case.  SPECIMEN(S):  None  INDICATIONS:   Brett Small is a 43 y.o. male who presents with ESRD and need for permanent hemodialysis access.  Risk, benefits, and alternatives to access surgery were discussed.  The patient is aware the risks include but are not limited to: bleeding, infection, steal syndrome, nerve damage, ischemic monomelic neuropathy, failure to mature, and need for additional procedures.  The patient is aware of the risks and elects to proceed forward.  DESCRIPTION: After full informed written consent was obtained from the patient, the patient was brought back to the operating room and placed supine upon the operating table.  The patient was given IV antibiotics prior to proceeding.  After obtaining adequate sedation, the patient was prepped and draped in standard fashion for a left arm access procedure.  I turned my attention first to the antecubitum.  Under ultrasound guidance, I identified the location of the brachial artery marked it on the skin and I also went up to the axilla and identified the larger of two deep brachial veins and marked it on the skin as well.  I injected 1% lidocaine without epinephrine at each incision.  I made a longitudinal  incision over the brachial artery and another one up by the brachial vein near the axilla.  I dissected down through the subcutaneous tissue and  fascia carefully and was able to dissect out the brachial artery.  The artery was about 3 mm externally.  It was controlled proximally and distally with vessel loops.  I then dissected out the brachial vein.  Externally, it appeared to be 2.5 mm in diameter.  I made an incision at the apex of the route and dissected a shallow pocket.  I took a Dietitian and dissected from the antecubital incision to the axillary incision.  Then I delivered the 4 x 7-mm stretch Gore-Tex graft, through this metal tunneler and then pulled out the metal tunneler leaving the graft in place. I then gave the patient 3000 units of heparin to gain anticoagulation.  After waiting 3 minutes, I placed the brachial artery under tension proximally and distally with vessel loops, made an arteriotomy and extended it with a Potts scissor.  I sewed the 4-mm end of the graft to this arteriotomy with a running stitch of 6-0 Prolene.  At this point, then I completed the anastomosis in the usual fashion.  I released the vessel loops on the inflow and allowed the artery to decompress through the graft. There was good pulsatile bleeding through this graft.  I clamped the graft near its arterial anastomosis and sucked out all the blood in the graft and loaded the graft with heparinized saline.  At this point, I pulled the graft to appropriate length and reset my exposure of the high brachial vein.  There was good venous backbleeding from the vein.  Then, I injected some heparinized saline into this vein and then clamped it.  I then opened the vein with an 11 blade scalpel and extended with potts scissors to facilitate an end-to-side anastomosis.  I also spatulated the graft to facilitate an end-to-side anastomosis.  In the process of spatulating, I cut the graft to appropriate length for this  anastomosis.  This graft was sewn to the vein in an end-to-side configuration with a 6-0 Prolene.  Prior to completing this anastomosis, I allowed the vein to back bleed and then I also allowed the artery to bleed in an antegrade fashion.  I completed this anastomosis in the usual fashion, and then irrigated out both incisions.  I then turned my attention back to the antecubitum.  The distal radial artery was dopplerable.  Using a continuous Doppler, the brachial artery proximally and distally had triphasic waveforms.  The venous outflow had a flow signature consistent with widely patent arteriovenous graft and had a palpable thrill.  The subcutaneous tissue in both incisions was reapproximated with a running stitch of 3-0 Vicryl.  The skin was then reapproximated with a running subcuticular 4-0 Monocryl.  The skin was then cleaned, dried, and Dermabond used to reinforce the skin closure.   COMPLICATIONS: None  CONDITION: Stable   Marty Heck, MD [facility] Office: [phone] Pager: [phone]   12/26/2018, 11:04 AM

## 2018-12-26 NOTE — Anesthesia Procedure Notes (Signed)
Procedure Name: MAC Date/Time: 12/26/2018 9:36 AM Performed by: Mariea Clonts, CRNA Pre-anesthesia Checklist: Patient identified, Emergency Drugs available, Suction available, Patient being monitored and Timeout performed Patient Re-evaluated:Patient Re-evaluated prior to induction Oxygen Delivery Method: Nasal cannula

## 2018-12-27 DIAGNOSIS — S91001A Unspecified open wound, right ankle, initial encounter: Secondary | ICD-10-CM

## 2018-12-27 DIAGNOSIS — X58XXXA Exposure to other specified factors, initial encounter: Secondary | ICD-10-CM

## 2018-12-27 LAB — MRSA PCR SCREENING: MRSA by PCR: NEGATIVE

## 2018-12-27 MED ORDER — CHLORHEXIDINE GLUCONATE CLOTH 2 % EX PADS
6.0000 | MEDICATED_PAD | Freq: Every day | CUTANEOUS | Status: DC
  Administered 2018-12-27 – 2019-01-07 (×10): 6 via TOPICAL

## 2018-12-27 MED ORDER — PRO-STAT SUGAR FREE PO LIQD
30.0000 mL | Freq: Two times a day (BID) | ORAL | Status: DC
  Administered 2019-01-01 – 2019-01-05 (×2): 30 mL via ORAL
  Filled 2018-12-27 (×12): qty 30

## 2018-12-27 MED ORDER — SODIUM CHLORIDE 0.9 % IV SOLN
125.0000 mg | INTRAVENOUS | Status: DC
  Administered 2018-12-30 – 2019-01-06 (×4): 125 mg via INTRAVENOUS
  Filled 2018-12-27 (×7): qty 10

## 2018-12-27 MED ORDER — SEVELAMER CARBONATE 800 MG PO TABS
800.0000 mg | ORAL_TABLET | Freq: Three times a day (TID) | ORAL | Status: DC
  Administered 2018-12-27 – 2018-12-31 (×8): 800 mg via ORAL
  Filled 2018-12-27 (×8): qty 1

## 2018-12-27 MED ORDER — GABAPENTIN 300 MG PO CAPS
300.0000 mg | ORAL_CAPSULE | Freq: Every day | ORAL | Status: DC
  Administered 2018-12-28 – 2019-01-06 (×10): 300 mg via ORAL
  Filled 2018-12-27 (×11): qty 1

## 2018-12-27 MED ORDER — HEPARIN SODIUM (PORCINE) 1000 UNIT/ML IJ SOLN
INTRAMUSCULAR | Status: AC
  Administered 2018-12-27: 3000 [IU] via INTRAVENOUS_CENTRAL
  Filled 2018-12-27: qty 3

## 2018-12-27 MED ORDER — RIVAROXABAN 2.5 MG PO TABS
2.5000 mg | ORAL_TABLET | Freq: Two times a day (BID) | ORAL | Status: DC
  Administered 2018-12-27 – 2019-01-01 (×11): 2.5 mg via ORAL
  Filled 2018-12-27 (×11): qty 1

## 2018-12-27 MED ORDER — CALCITRIOL 0.25 MCG PO CAPS
0.2500 ug | ORAL_CAPSULE | ORAL | Status: DC
  Administered 2018-12-30 – 2019-01-06 (×4): 0.25 ug via ORAL
  Filled 2018-12-27 (×2): qty 1

## 2018-12-27 NOTE — Progress Notes (Addendum)
   Subjective: No acute events overnight. Brett Small continues to have some numbness and tingling in the thumb, index, and middle ringer of his left hand. He also notices some weakness in his grip strength. He has no pain in his hand. His left upper arm hurts where the procedure was done on 3/6. Otherwise, Brett Small has no concerns. He is breathing well and has no chest pain, n/v, fever or chills.  Objective:  Vital signs in last 24 hours: Vitals:   12/26/18 1543 12/26/18 2037 12/27/18 0509 12/27/18 0528  BP: 119/76 118/75 118/80 125/82  Pulse:    86  Resp:  12  12  Temp:  98.5 F (36.9 C)  97.6 F (36.4 C)  TempSrc:  Oral  Oral  SpO2:  95%  95%  Weight:    49.9 kg  Height:       General: pt is sleeping but wakes easily upon interview Cardiovascular: rrr, no murmurs, thrills, or heaves. No extremity edema. Pulses 2+ in left brachial, ulnar, and radial arteries. Respiratory: lungs are CTA bilaterally Extremities: venous graft in the upper left arm. Paresthesias in the left hand along the median nerve distribution. Strength is decreased to 4/5. No swelling or erythema in the left arm. ulnar and radial pulses are 2+. 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   Acute respiratory failure with hypoxia 2/2 ESRD on HD. Much improved after HD on 3/5. Appears euvolemic on 3/7. Needs local HD center and we are awaiting CLIP process. Greatly appreciate nephrology consultation.  - dialysis scheduled for 3/7 to  follow his T/Th/Sat schedule - Awaiting CLIP  PVD. 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 Continue aspirin 81mg  daily. Will restart low dose xarelto today his history of recurrent graft thrombosis.  --Resume home xarelto 2.5mg  bid  HTN - 125/82 (3/7 0528) - Continue withamlodipine 5mg  qd, coreg 25mg  bid, hydralazine 25mg  q8h  Chronic back pain. Stable. -Continuetylenol, lidocaine patch, k pad   Right lateral malleolus wound -wound care saw pt on 3/5 and dressed wound with xeroform antimicrobial dressing w/ instructions to change dressing daily  Dispo: Anticipated discharge pending nephrology arrangements for outpatient dialysis.   LOS: 2 days   Noralyn Pick, Medical Student 12/27/2018, 8:10 AM Pager: [phone]  Attestation for Student Documentation:  I personally was present and performed or re-performed the history, physical exam and medical decision-making activities of this service and have verified that the service and findings are accurately documented in the student's note.  Velna Ochs, MD 12/27/2018, 10:12 AM

## 2018-12-27 NOTE — Progress Notes (Signed)
[facility]  Subjective  - Numbness in left 1st, 2nd, 3rd finger.  No motor deficit.  States not painful.   Objective 123/77 86 97.6 F (36.4 C) (Oral) 12 95%  Intake/Output Summary (Last 24 hours) at 12/27/2018 0917 Last data filed at 12/27/2018 0400 Gross per 24 hour  Intake 1457 ml  Output 15 ml  Net 1442 ml    Left upper arm AVG with great thrill Left radial and ulnar pulse palpable at wrist  Laboratory Lab Results: Recent Labs    12/25/18 0413 12/26/18 0305  WBC 12.1* 8.6  HGB 9.9* 9.9*  HCT 31.2* 31.3*  PLT 191 123*   BMET Recent Labs    12/25/18 0413 12/26/18 0305  NA 142 138  K 4.4 4.0  CL 106 101  CO2 15* 24  GLUCOSE 112* 88  BUN 84* 36*  CREATININE 12.16* 6.58*  CALCIUM 7.8* 8.2*    COAG Lab Results  Component Value Date   INR 1.1 12/26/2018   No results found for: PTT  Assessment/Planning: POD#1 s/p left upper arm AVG.  Great thrill in graft and palpable pulses at wrist.  Complaining of some numbness in left 1-3 digits of hand.  No motor weakness.  States he can live with symptoms.  Would continue with conservative management, gabapentin may help.  Could be component of steal vs nerve manipulation.  Hope this will improve with time.    Marty Heck 12/27/2018 9:17 AM --

## 2018-12-27 NOTE — Progress Notes (Signed)
Patient taken via bed to HD.

## 2018-12-27 NOTE — Progress Notes (Addendum)
[facility] Progress Note   Subjective:   Seen and examined at bedside.  Feeling ok today.  Denies SOB, CP, n/v/d, weakness, and edema.  Awaiting CLIP to OP dialysis unit in [place].    Objective Vitals:   12/26/18 2037 12/27/18 0509 12/27/18 0528 12/27/18 0909  BP: 118/75 118/80 125/82 123/77  Pulse:   86   Resp: 12  12   Temp: 98.5 F (36.9 C)  97.6 F (36.4 C)   TempSrc: Oral  Oral   SpO2: 95%  95%   Weight:   49.9 kg   Height:       Physical Exam General:NAD, well appearing male Heart:RRR, no mrg Lungs:CTAB, no wheezes, rales or rhonchi Abdomen:soft, NTND Extremities:no LE edema, R foot wrapped Dialysis Access: LU AVG +b, R IJ [facility]   Filed Weights   12/25/18 1353 12/26/18 0616 12/27/18 0528  Weight: 48.3 kg 48.3 kg 49.9 kg    Intake/Output Summary (Last 24 hours) at 12/27/2018 1158 Last data filed at 12/27/2018 1056 Gross per 24 hour  Intake 907 ml  Output 200 ml  Net 707 ml    Additional Objective Labs: Basic Metabolic Panel: Recent Labs  Lab 12/24/18 2355 12/25/18 0008 12/25/18 0413 12/25/18 0426 12/26/18 0305  NA 141 139 142  --  138  K 4.6 4.7 4.4  --  4.0  CL 107  --  106  --  101  CO2 16*  --  15*  --  24  GLUCOSE 123*  --  112*  --  88  BUN 82*  --  84*  --  36*  CREATININE 12.33* 13.30* 12.16*  --  6.58*  CALCIUM 8.1*  --  7.8*  --  8.2*  PHOS  --   --  9.0* 8.9*  --    Liver Function Tests: Recent Labs  Lab 12/24/18 2355 12/25/18 0413  AST 18  --   ALT 17  --   ALKPHOS 67  --   BILITOT 0.6  --   PROT 7.1  --   ALBUMIN 3.5 2.8*   No results for input(s): LIPASE, AMYLASE in the last 168 hours. CBC: Recent Labs  Lab 12/23/18 1055 12/24/18 2355 12/25/18 0008 12/25/18 0413 12/26/18 0305  WBC 8.3 14.2*  --  12.1* 8.6  NEUTROABS  --  12.4*  --   --   --   HGB 11.0* 12.3* 12.6* 9.9* 9.9*  HCT 34.9* 39.3 37.0* 31.2* 31.3*  MCV 91.6 90.8  --  90.2 88.7  PLT 185 214  --  191 123*   Blood Culture No results found  for: SDES, SPECREQUEST, CULT, REPTSTATUS  Cardiac Enzymes: Recent Labs  Lab 12/24/18 2355  TROPONINI <0.03   CBG: Recent Labs  Lab 12/25/18 0053  GLUCAP 116*   Iron Studies:  Recent Labs    12/25/18 0426  IRON 18*  TIBC 211*  FERRITIN 165   Lab Results  Component Value Date   INR 1.1 12/26/2018   Studies/Results: Vas [place] Upper Ext Vein Mapping (pre-op Avf)  Result Date: 12/25/2018 UPPER EXTREMITY VEIN MAPPING  Indications: Pre-access. Performing Technologist: Abram Sander RVS  Examination Guidelines: A complete evaluation includes B-mode imaging, spectral Doppler, color Doppler, and power Doppler as needed of all accessible portions of each vessel. Bilateral testing is considered an integral part of a complete examination. Limited examinations for reoccurring indications may be performed as noted. +-----------------+-------------+----------+----------------------------------+ Right Cephalic   Diameter (cm)Depth (cm)  Findings              +-----------------+-------------+----------+----------------------------------+ Shoulder             0.30        0.36                                      +-----------------+-------------+----------+----------------------------------+ Prox upper arm       0.22        0.28                                      +-----------------+-------------+----------+----------------------------------+ Mid upper arm        0.22        0.27                                      +-----------------+-------------+----------+----------------------------------+ Dist upper arm                          not visualized due to iv placement +-----------------+-------------+----------+----------------------------------+ Antecubital fossa    0.25        0.23                                      +-----------------+-------------+----------+----------------------------------+ Prox forearm         0.25        0.39               branching               +-----------------+-------------+----------+----------------------------------+ Mid forearm          0.26        0.18                                      +-----------------+-------------+----------+----------------------------------+ Dist forearm         0.22        0.19                                      +-----------------+-------------+----------+----------------------------------+ Wrist                0.23        0.19                                      +-----------------+-------------+----------+----------------------------------+ +-----------------+-------------+----------+----------------------------------+ Right Basilic    Diameter (cm)Depth (cm)             Findings              +-----------------+-------------+----------+----------------------------------+ Prox upper arm       0.24        0.93                                      +-----------------+-------------+----------+----------------------------------+ Mid upper arm  0.31        0.76                                      +-----------------+-------------+----------+----------------------------------+ Dist upper arm       0.19        0.27               thrombosed             +-----------------+-------------+----------+----------------------------------+ Antecubital fossa                       not visualized due to IV placement +-----------------+-------------+----------+----------------------------------+ Prox forearm                                      not visualized           +-----------------+-------------+----------+----------------------------------+ Mid forearm                                       not visualized           +-----------------+-------------+----------+----------------------------------+ Distal forearm                                    not visualized           +-----------------+-------------+----------+----------------------------------+ Elbow                                              not visualized           +-----------------+-------------+----------+----------------------------------+ Wrist                                             not visualized           +-----------------+-------------+----------+----------------------------------+ +-----------------+-------------+----------+--------------+ Left Cephalic    Diameter (cm)Depth (cm)   Findings    +-----------------+-------------+----------+--------------+ Shoulder             0.41        0.28                  +-----------------+-------------+----------+--------------+ Prox upper arm       0.42        0.29                  +-----------------+-------------+----------+--------------+ Mid upper arm        0.21        0.24                  +-----------------+-------------+----------+--------------+ Dist upper arm       0.24        0.23                  +-----------------+-------------+----------+--------------+ Antecubital fossa    0.32        0.17                  +-----------------+-------------+----------+--------------+ Prox forearm  0.11        0.35     branching    +-----------------+-------------+----------+--------------+ Mid forearm                             not visualized +-----------------+-------------+----------+--------------+ Dist forearm                            not visualized +-----------------+-------------+----------+--------------+ Wrist                                   not visualized +-----------------+-------------+----------+--------------+ +-----------------+-------------+----------+----------------------------+ Left Basilic     Diameter (cm)Depth (cm)          Findings           +-----------------+-------------+----------+----------------------------+ Prox upper arm       0.25        0.53                                +-----------------+-------------+----------+----------------------------+ Mid upper arm         0.21        0.46                                +-----------------+-------------+----------+----------------------------+ Dist upper arm       0.15        0.35                                +-----------------+-------------+----------+----------------------------+ Antecubital fossa    0.25        0.45            branching           +-----------------+-------------+----------+----------------------------+ Prox forearm                            branching and not visualized +-----------------+-------------+----------+----------------------------+ Mid forearm                                    not visualized        +-----------------+-------------+----------+----------------------------+ Distal forearm                                 not visualized        +-----------------+-------------+----------+----------------------------+ Elbow                                          not visualized        +-----------------+-------------+----------+----------------------------+ Wrist                                          not visualized        +-----------------+-------------+----------+----------------------------+ *See table(s) above for measurements and observations.  Diagnosing physician: Servando Snare MD Electronically signed by Servando Snare MD on 12/25/2018 at 5:51:56 PM.    Final  Medications: . sodium chloride    . sodium chloride    . sodium chloride 10 mL/hr at 12/27/18 0400   . amLODipine  5 mg Oral Daily  . aspirin EC  81 mg Oral Daily  . atorvastatin  80 mg Oral QHS  . carvedilol  25 mg Oral BID WC  . Chlorhexidine Gluconate Cloth  6 each Topical Q0600  . gabapentin  300 mg Oral TID  . hydrALAZINE  25 mg Oral Q8H  . lidocaine  1 patch Transdermal Q24H  . multivitamin  1 tablet Oral QHS  . rivaroxaban  2.5 mg Oral BID    Dialysis Orders: From previous unit in [place] 4.5hrs - TTS 2K 2.5Ca, 400/AF 1.5 EDW 47kg Access: R IJ TDC, new LU AVG+b Hep 2100  unit bolus + 2000 unit mid run Calcitriol 0.25 mcg qHD  Assessment/Plan: 1. Acute Respiratory failure w/hypoxis 2/2 volume overload d/t missed HD - improved.  Appears euvolemic on exam. PRe HD standing weight 50.8kg. Continue to titrate down volume as tolerated.  May need EDW adjusted at d/c. 2. ESRD - on HD.  Awaiting CLIP to HD unit in [place]. HD today, continue on TTS schedule while admitted as likely will have this schedule as OP.  K 4.0.  3. New HD access placement - LU AVG placed 3/7 by Dr. Carlis Abbott. Mild numbness in hand. VVS following.  4. Anemia of CKD- Hgb 9.9.  Tsat 9% - start Fe bolus.   5. Secondary hyperparathyroidism - Ca at goal. Phos 8.9. Start binders Renvela 1 AC TID.  Continue VDRA.  6. HTN - BP well controlled on current regimen.  May be able to titrate down BP meds with continued volume removal.   7. Nutrition - Renal diet w/ fluid restrictions. Protein supplements 8. PVD - s/p aortobifemoral bypass w/redo. Per primary 9. R lateral malleolus wound - WC consulted   Jen Mow, PA-C [facility] Pager: [phone] 12/27/2018,11:58 AM  LOS: 2 days   Pt seen, examined and agree w A/P as above.  [facility] 12/27/2018, 3:05 PM

## 2018-12-28 LAB — RENAL FUNCTION PANEL
Albumin: 2.8 g/dL — ABNORMAL LOW (ref 3.5–5.0)
Anion gap: 14 (ref 5–15)
BUN: 29 mg/dL — ABNORMAL HIGH (ref 6–20)
CO2: 24 mmol/L (ref 22–32)
Calcium: 8.4 mg/dL — ABNORMAL LOW (ref 8.9–10.3)
Chloride: 99 mmol/L (ref 98–111)
Creatinine, Ser: 5.68 mg/dL — ABNORMAL HIGH (ref 0.61–1.24)
GFR calc Af Amer: 13 mL/min — ABNORMAL LOW (ref >60)
GFR calc non Af Amer: 11 mL/min — ABNORMAL LOW (ref >60)
GLUCOSE: 99 mg/dL (ref 70–99)
Phosphorus: 6.3 mg/dL — ABNORMAL HIGH (ref 2.5–4.6)
Potassium: 3.7 mmol/L (ref 3.5–5.1)
Sodium: 137 mmol/L (ref 135–145)

## 2018-12-28 LAB — PARATHYROID HORMONE, INTACT (NO CA): PTH: 132 pg/mL — ABNORMAL HIGH (ref 15–65)

## 2018-12-28 MED ORDER — SALINE SPRAY 0.65 % NA SOLN
1.0000 | NASAL | Status: DC | PRN
  Administered 2018-12-28 – 2018-12-29 (×2): 1 via NASAL
  Filled 2018-12-28: qty 44

## 2018-12-28 NOTE — Progress Notes (Addendum)
Patient noted with 20 beat run of SVT. Patient asymptomatic.  On call made aware via [place]. No new orders.

## 2018-12-28 NOTE — Progress Notes (Addendum)
[facility]  Subjective  - Numbness in left 1st, 2nd, 3rd finger after graft placement.   Objective 124/67 87 98 F (36.7 C) (Oral) 16 99%  Intake/Output Summary (Last 24 hours) at 12/28/2018 0925 Last data filed at 12/27/2018 1627 Gross per 24 hour  Intake -  Output 2320 ml  Net -2320 ml    Left upper arm AVG with great thrill Left radial and ulnar pulse palpable at wrist  Laboratory Lab Results: Recent Labs    12/26/18 0305  WBC 8.6  HGB 9.9*  HCT 31.3*  PLT 123*   BMET Recent Labs    12/26/18 0305  NA 138  K 4.0  CL 101  CO2 24  GLUCOSE 88  BUN 36*  CREATININE 6.58*  CALCIUM 8.2*    COAG Lab Results  Component Value Date   INR 1.1 12/26/2018   No results found for: PTT  Assessment/Planning: POD#2 s/p left upper arm AVG.  Great thrill in graft and palpable pulses at wrist.  Complaining of some numbness in left 1-3 digits of hand.  No overt motor weakness on exam.  States he can live with symptoms.  Would continue with conservative management, gabapentin may help.  Could be component of steal vs nerve manipulation.  Hope this will improve with time and discussed if worsens would likely ligate graft.  Call vascular with questions or concerns.  Brett Small 12/28/2018 9:25 AM --

## 2018-12-28 NOTE — Progress Notes (Addendum)
[facility] Progress Note   Subjective:   Patient examined at bedside. No new concerns today. Denies CP, palpitations, SOB, N/V/D, peripheral edema. Completed dialysis yesterday with net UF 2120 mL.   Objective Vitals:   12/27/18 1700 12/27/18 2040 12/28/18 0511 12/28/18 1034  BP: (!) 153/88 110/69 124/67 (!) 142/85  Pulse:  80 87   Resp:  16 16   Temp:  98.9 F (37.2 C) 98 F (36.7 C)   TempSrc:  Oral Oral   SpO2:  97% 99%   Weight:   48.2 kg   Height:       Physical Exam General: Well developed, alert male in NAD Heart: RRR, no murmurs, rubs or gallops Lungs: CTA bilaterally without wheezing, rhonchi or rales Abdomen: Soft, non-tender, non-distended. + BS, no palpable masses Extremities: No edema b/l lower extremities Dialysis Access: New LUE AVG, + bruit. R IJ [facility]  Additional Objective Labs: Basic Metabolic Panel: Recent Labs  Lab 12/25/18 0413 12/25/18 0426 12/26/18 0305 12/28/18 0928  NA 142  --  138 137  K 4.4  --  4.0 3.7  CL 106  --  101 99  CO2 15*  --  24 24  GLUCOSE 112*  --  88 99  BUN 84*  --  36* 29*  CREATININE 12.16*  --  6.58* 5.68*  CALCIUM 7.8*  --  8.2* 8.4*  PHOS 9.0* 8.9*  --  6.3*   Liver Function Tests: Recent Labs  Lab 12/24/18 2355 12/25/18 0413 12/28/18 0928  AST 18  --   --   ALT 17  --   --   ALKPHOS 67  --   --   BILITOT 0.6  --   --   PROT 7.1  --   --   ALBUMIN 3.5 2.8* 2.8*   CBC: Recent Labs  Lab 12/23/18 1055 12/24/18 2355 12/25/18 0008 12/25/18 0413 12/26/18 0305  WBC 8.3 14.2*  --  12.1* 8.6  NEUTROABS  --  12.4*  --   --   --   HGB 11.0* 12.3* 12.6* 9.9* 9.9*  HCT 34.9* 39.3 37.0* 31.2* 31.3*  MCV 91.6 90.8  --  90.2 88.7  PLT 185 214  --  191 123*    Cardiac Enzymes: Recent Labs  Lab 12/24/18 2355  TROPONINI <0.03   CBG: Recent Labs  Lab 12/25/18 0053  GLUCAP 116*   Medications: . sodium chloride    . sodium chloride    . sodium chloride 10 mL/hr at 12/27/18 0400  .  [START ON 12/30/2018] ferric gluconate (FERRLECIT/NULECIT) IV     . amLODipine  5 mg Oral Daily  . aspirin EC  81 mg Oral Daily  . atorvastatin  80 mg Oral QHS  . [START ON 12/30/2018] calcitRIOL  0.25 mcg Oral Q T,Th,Sa-HD  . carvedilol  25 mg Oral BID WC  . Chlorhexidine Gluconate Cloth  6 each Topical Q0600  . feeding supplement (PRO-STAT SUGAR FREE 64)  30 mL Oral BID  . gabapentin  300 mg Oral QHS  . hydrALAZINE  25 mg Oral Q8H  . lidocaine  1 patch Transdermal Q24H  . multivitamin  1 tablet Oral QHS  . rivaroxaban  2.5 mg Oral BID  . sevelamer carbonate  800 mg Oral TID WC    Dialysis Orders: From previous unit in [place] 4.5hrs - TTS 2K 2.5Ca, 400/AF 1.5 EDW 47kg Access: R IJ TDC, new LU AVG+b Hep 2100 unit bolus + 2000 unit mid run  Calcitriol 0.25 mcg qHD  Assessment/Plan: 1. Acute Respiratory failure w/hypoxis 2/2 volume overload d/t missed HD: Completed dialysis 3/5 and 3/7, respiratory status now improved.  Appears euvolemic on exam today. Weight down to 48.2 kg. May need EDW adjusted at discharge.  2. ESRD: Continue HD on TTS. Awaiting CLIP to outpatient dialysis unit in [place]. K 3.9.  3. New HD access placement: LU AVG placed 3/7 by Dr. Carlis Abbott. Reportedly still having some numbness in his left hand. VVS following.  4. Anemia of CKD: Hgb 9.9. Tsat 9%, was started on Fe bolus on 3/7. 5. Secondary hyperparathyroidism:  Ca at goal.  Continue VDRA. Phos improved to 6.3, was started on Renvela 1 AC TID.  6. HTN: BP overall well controlled on current regimen.  May be able to titrate down BP meds with continued volume removal.   7. Nutrition: Albumin still low at 2.8. Renal diet w/ fluid restrictions. Protein supplements 8. PVD: s/p aortobifemoral bypass w/redo. Per primary 9. R lateral malleolus wound: Per primary team/wound care.   Anice Paganini, PA-C 12/28/2018, 11:44 AM  [facility] Pager: [phone]  Pt seen, examined and agree w A/P as  above.  [facility] 12/28/2018, 1:28 PM

## 2018-12-28 NOTE — Progress Notes (Signed)
   Subjective: Sleeping comfortably this morning. Feels well aside from persistent pain and numbness of his left hand. Denies SOB. All questions and concerns addressed.  Objective:  Vital signs in last 24 hours: Vitals:   12/27/18 1652 12/27/18 1700 12/27/18 2040 12/28/18 0511  BP: (!) 153/88 (!) 153/88 110/69 124/67  Pulse: 96  80 87  Resp: 16  16 16   Temp: 99.1 F (37.3 C)  98.9 F (37.2 C) 98 F (36.7 C)  TempSrc: Oral  Oral Oral  SpO2: 98%  97% 99%  Weight:    48.2 kg  Height:       General: comfortably laying in bed, no distress CV: RRR, no murmurs  Neuro: bilateral upper extremity strength 5/5, sensation intact Skin: warm and dry Ext: no edema, warm and well perfused   Assessment/Plan:  Principal Problem:   End stage renal disease (HCC) Active Problems:   Acute respiratory failure with hypoxia (HCC)   Hypertension   Renal osteodystrophy  Brett Small is a 43year old man admitted with pulmonary edema due toESRD on HD TThSa via temporary right IJ catheter since Sept 2019, HTN, HFrEF (EF45-50%), PVDs/p aortobifemoral bypass w/ redo after closure and 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   Acute respiratory failure with hypoxia 2/2 ESRD on HD. Much improved after HD. Euvolemic on exam. Needs local HD center and we are awaiting CLIP process. Greatly appreciate nephrology recommendations.  -- He is POD 2 s/p left upper arm AVG. Appreciated thrill in graft. He reports numbness in his left hand, vascular is following. Started gabapentin 300 mg daily. Appreciate recommendations. 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 -- Continue aspirin 81mg  daily -- Continue home xarelto 2.5mg  bid  HTN - Continue withamlodipine 5mg  qd, coreg 25mg  bid, hydralazine 25mg  q8h  Chronic back pain.  Stable. -Continuetylenol, lidocaine patch, k pad   Right lateral malleolus wound -wound care saw pt on 3/5 and dressed wound with xeroform antimicrobial dressing w/ instructions to change dressing daily  Dispo: Anticipated discharge pending CLIP process.  Mike Craze, DO 12/28/2018, 7:14 AM Pager: [phone]

## 2018-12-29 ENCOUNTER — Encounter (HOSPITAL_COMMUNITY): Payer: Self-pay | Admitting: Vascular Surgery

## 2018-12-29 NOTE — Progress Notes (Addendum)
   Subjective: No events overnight. Mr. Vanriper is doing well. He has some soreness at the site of his vascular procedure. He continues to have some numbness in his left hand and feels his grip strength is poor. We discussed the importance of a low sodium diet, as some soy sauce packets were noted on his bedside table.  Objective:  Vital signs in last 24 hours: Vitals:   12/28/18 1300 12/28/18 1347 12/28/18 2042 12/29/18 0541  BP: 123/73  124/73 122/77  Pulse:   79 87  Resp:  14 13 16   Temp:  98.4 F (36.9 C) 98.5 F (36.9 C) 98 F (36.7 C)  TempSrc:  Oral Oral Oral  SpO2:  97% 96% 98%  Weight:    48.5 kg  Height:       Physical Exam: General: Patient is resting in bed eating breakfast.  Cardiovascular: RRR, no murmurs. Left arm vascular graft continuous thrill is appreciated Respiratory: Lungs CTA bilaterally Neurologic: Alert and Oriented x3. Left arm grip strength is 4/5. Paresthesias on palmar surface of thumb, pointer, and middle finger on left hand.  BMP Latest Ref Rng & Units 12/28/2018 12/26/2018 12/25/2018  Glucose 70 - 99 mg/dL 99 88 112(H)  BUN 6 - 20 mg/dL 29(H) 36(H) 84(H)  Creatinine 0.61 - 1.24 mg/dL 5.68(H) 6.58(H) 12.16(H)  Sodium 135 - 145 mmol/L 137 138 142  Potassium 3.5 - 5.1 mmol/L 3.7 4.0 4.4  Chloride 98 - 111 mmol/L 99 101 106  CO2 22 - 32 mmol/L 24 24 15(L)  Calcium 8.9 - 10.3 mg/dL 8.4(L) 8.2(L) 7.8(L)     Assessment/Plan:  Principal Problem:   End stage renal disease ([place]) Active Problems:   Acute respiratory failure with hypoxia (HCC)   Hypertension   Renal osteodystrophy  Mr. 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    Acute respiratory failure with hypoxia -  resolved  ESRD on HD T/Th/Sat Euvolemic on exam.Greatly appreciate nephrology recommendations.  --POD 3 s/p left upper arm AVG. Appreciate vascular recommendations. 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 -- Continue aspirin 81mg  daily -- Continue homexarelto 2.5mg  bid  HTN - Continue withamlodipine 5mg  qd, coreg 25mg  bid, hydralazine 25mg  q8h  Chronic back pain. Stable. -Continuetylenol, lidocaine patch, k pad  Right lateral malleolus wound -wound care saw pt on 3/5 and dressed wound with xeroform antimicrobial dressing w/ instructions to change dressing daily  Dispo: Anticipated discharge pending CLIP process  LOS: 4 days   Noralyn Pick, Medical Student 12/29/2018, 11:30 AM Pager: [phone]  Attestation for Student Documentation:  I personally was present and performed or re-performed the history, physical exam and medical decision-making activities of this service and have verified that the service and findings are accurately documented in the student's note.  Velna Ochs, MD 12/29/2018, 2:51 PM

## 2018-12-29 NOTE — Progress Notes (Signed)
Patient resting comfortably on room air with no respiratory distress noted. BIPAP not needed at this time. RT will monitor as needed.

## 2018-12-29 NOTE — Progress Notes (Signed)
CSW met with the patient at bedside. Patient was alert and oriented. CSW spoke with the patient about his homelessness issue. Patient reported that he was staying at motels until his check ran out. Patient reported that he receives 522.00 per month in an SSI check. CSW spoke with the patient about shelters, affordable housing, and other resources. CSW also went over the transportation list with the patient.   The patient reported no other concerns nor had any more questions. CSW provided resources and signing off.   Domenic Schwab, MSW, [place]

## 2018-12-29 NOTE — Care Management Important Message (Signed)
Important Message  Patient Details  Name: Brett Small MRN: [id number] Date of Birth: [birth year]   Medicare Important Message Given:  Yes    Jayln Madeira P Yobani Schertzer 12/29/2018, 2:56 PM

## 2018-12-29 NOTE — Progress Notes (Signed)
[facility] Progress Note   Subjective:  Seen in room. Sleeping, rouses easily. No CP, SOB, orthopnea.   Objective Vitals:   12/28/18 1300 12/28/18 1347 12/28/18 2042 12/29/18 0541  BP: 123/73  124/73 122/77  Pulse:   79 87  Resp:  14 13 16   Temp:  98.4 F (36.9 C) 98.5 F (36.9 C) 98 F (36.7 C)  TempSrc:  Oral Oral Oral  SpO2:  97% 96% 98%  Weight:    48.5 kg  Height:        Physical Exam General: WNWD male NAD Heart: RRR no murmur, no rub Lungs: CTAB Abdomen: soft NTND Extremities: No LE edema  Dialysis Access: New LUE AVG +bruit, ; R IJ TDC   Wt Readings from Last 3 Encounters:  12/29/18 48.5 kg   Weight change: -2.311 kg   Additional Objective Labs: Basic Metabolic Panel: Recent Labs  Lab 12/25/18 0413 12/25/18 0426 12/26/18 0305 12/28/18 0928  NA 142  --  138 137  K 4.4  --  4.0 3.7  CL 106  --  101 99  CO2 15*  --  24 24  GLUCOSE 112*  --  88 99  BUN 84*  --  36* 29*  CREATININE 12.16*  --  6.58* 5.68*  CALCIUM 7.8*  --  8.2* 8.4*  PHOS 9.0* 8.9*  --  6.3*   CBC: Recent Labs  Lab 12/23/18 1055 12/24/18 2355 12/25/18 0008 12/25/18 0413 12/26/18 0305  WBC 8.3 14.2*  --  12.1* 8.6  NEUTROABS  --  12.4*  --   --   --   HGB 11.0* 12.3* 12.6* 9.9* 9.9*  HCT 34.9* 39.3 37.0* 31.2* 31.3*  MCV 91.6 90.8  --  90.2 88.7  PLT 185 214  --  191 123*   Blood Culture No results found for: SDES, SPECREQUEST, CULT, REPTSTATUS   Medications: . sodium chloride    . sodium chloride    . sodium chloride 10 mL/hr at 12/27/18 0400  . [START ON 12/30/2018] ferric gluconate (FERRLECIT/NULECIT) IV     . amLODipine  5 mg Oral Daily  . aspirin EC  81 mg Oral Daily  . atorvastatin  80 mg Oral QHS  . [START ON 12/30/2018] calcitRIOL  0.25 mcg Oral Q T,Th,Sa-HD  . carvedilol  25 mg Oral BID WC  . Chlorhexidine Gluconate Cloth  6 each Topical Q0600  . feeding supplement (PRO-STAT SUGAR FREE 64)  30 mL Oral BID  . gabapentin  300 mg Oral QHS   . hydrALAZINE  25 mg Oral Q8H  . lidocaine  1 patch Transdermal Q24H  . multivitamin  1 tablet Oral QHS  . rivaroxaban  2.5 mg Oral BID  . sevelamer carbonate  800 mg Oral TID WC    Dialysis Orders:  From previous unit in [place] 4.5hrs - TTS 2K 2.5Ca, 400/AF 1.5 EDW 47kg Access: R IJ TDC, LUE AVG placed 3/7  Hep 2100 unit bolus + 2000 unit mid run Calcitriol 0.25 mcg qHD  Assessment/Plan: 1. Acute respiratory failure/volume overload 2/2 to missed HD. Resolved with HD. 5L off so far. Weight down to 48.2kg  2. ESRD - HD TTS. Relocated to [facility] without OP center. Awaiting CLIP to new center. New LUE AVG placed 3/7 per Dr. Carlis Abbott. Gabapentin 300 started per VVS for L hand pain/numbness.  Next HD 3/10 on schedule.  3. HTN/volume- BP controlled. On amlodipine 5mg  carvedilol 25 bid, hydralazine 25 bid. Appears euvolemic on exam. Tolerating 2-3LUF  with HD.  UF to EDW.  4. Anemia- Hgb 9.9. Tsat 9%. Fe bolus started with HD.  5. Metabolic Bone Disease- Ca ok. Phos improving. On Renvela 1 TID. PTH controlled.  6. Nutrition - Prot supp for low albumin. Renal diet/vitamins 7. PVD. s/p aortobifemoral bypass w/redo.  Lynnda Child PA-C [facility] Pager [phone] 12/29/2018,8:43 AM  LOS: 4 days

## 2018-12-30 LAB — CBC
HCT: 28.5 % — ABNORMAL LOW (ref 39.0–52.0)
Hemoglobin: 8.9 g/dL — ABNORMAL LOW (ref 13.0–17.0)
MCH: 28.3 pg (ref 26.0–34.0)
MCHC: 31.2 g/dL (ref 30.0–36.0)
MCV: 90.5 fL (ref 80.0–100.0)
Platelets: 178 10*3/uL (ref 150–400)
RBC: 3.15 MIL/uL — AB (ref 4.22–5.81)
RDW: 14.8 % (ref 11.5–15.5)
WBC: 9.1 10*3/uL (ref 4.0–10.5)
nRBC: 0 % (ref 0.0–0.2)

## 2018-12-30 LAB — RENAL FUNCTION PANEL
Albumin: 2.7 g/dL — ABNORMAL LOW (ref 3.5–5.0)
Anion gap: 17 — ABNORMAL HIGH (ref 5–15)
BUN: 57 mg/dL — ABNORMAL HIGH (ref 6–20)
CO2: 21 mmol/L — ABNORMAL LOW (ref 22–32)
Calcium: 8.3 mg/dL — ABNORMAL LOW (ref 8.9–10.3)
Chloride: 99 mmol/L (ref 98–111)
Creatinine, Ser: 10.52 mg/dL — ABNORMAL HIGH (ref 0.61–1.24)
GFR calc non Af Amer: 5 mL/min — ABNORMAL LOW (ref >60)
GFR, EST AFRICAN AMERICAN: 6 mL/min — AB (ref >60)
Glucose, Bld: 105 mg/dL — ABNORMAL HIGH (ref 70–99)
Phosphorus: 10.2 mg/dL — ABNORMAL HIGH (ref 2.5–4.6)
Potassium: 4.2 mmol/L (ref 3.5–5.1)
Sodium: 137 mmol/L (ref 135–145)

## 2018-12-30 MED ORDER — CEPHALEXIN 250 MG PO CAPS
250.0000 mg | ORAL_CAPSULE | Freq: Two times a day (BID) | ORAL | Status: DC
  Administered 2018-12-30 – 2018-12-31 (×4): 250 mg via ORAL
  Filled 2018-12-30 (×5): qty 1

## 2018-12-30 MED ORDER — SODIUM CHLORIDE 0.9 % IV SOLN: 100.0000 mL | INTRAVENOUS | Status: DC | PRN

## 2018-12-30 MED ORDER — HEPARIN SODIUM (PORCINE) 1000 UNIT/ML IJ SOLN
INTRAMUSCULAR | Status: AC
  Administered 2018-12-30: 1000 [IU] via INTRAVENOUS_CENTRAL
  Filled 2018-12-30: qty 4

## 2018-12-30 MED ORDER — HEPARIN SODIUM (PORCINE) 1000 UNIT/ML DIALYSIS: 1000.0000 [IU] | INTRAMUSCULAR | Status: DC | PRN

## 2018-12-30 MED ORDER — CALCITRIOL 0.25 MCG PO CAPS
ORAL_CAPSULE | ORAL | Status: AC
  Administered 2018-12-30: 0.25 ug via ORAL
  Filled 2018-12-30: qty 1

## 2018-12-30 NOTE — Progress Notes (Signed)
Patient resting comfortably on room air. BIPAP is not needed at this time. RT will monitor as needed.

## 2018-12-30 NOTE — Progress Notes (Addendum)
Progress Note    12/30/2018 10:31 AM 4 Days Post-Op  Subjective: This patient was evaluated on hemodialysis via right IJ TDC.  He is 4 days status post left arm AV graft placement by Dr. Carlis Abbott on 12/26/2018.  Patient's main complaint is of pain and tenderness in upper arm.  He also has some numbness in left hand and radial nerve distribution however this is tolerable.  He denies any drainage from incisions of left arm.  He also denies any fevers, chills.  Work-up also includes repeated CBC which does not show any increase in white count.  He was started on Keflex by nephrology due to redness and edema of left arm.   Vitals:   12/30/18 1000 12/30/18 1015  BP: (!) 153/81 (!) 153/92  Pulse: 87 92  Resp: 12 12  Temp:    SpO2:     Physical Exam: Lungs: Nonlabored Incisions: Left axilla and antecubitum incisions are clean, dry, intact Extremities: 1+ left radial pulse; pitting edema with redness overlying tunnel tract left AV graft; palpable thrill and audible bruit throughout left arm AV graft; no ecchymosis or other sign of continued bleeding; no purulence noted Neurologic: A&O  CBC    Component Value Date/Time   WBC 9.1 12/30/2018 0803   RBC 3.15 (L) 12/30/2018 0803   HGB 8.9 (L) 12/30/2018 0803   HCT 28.5 (L) 12/30/2018 0803   PLT 178 12/30/2018 0803   MCV 90.5 12/30/2018 0803   MCH 28.3 12/30/2018 0803   MCHC 31.2 12/30/2018 0803   RDW 14.8 12/30/2018 0803   LYMPHSABS 0.8 12/24/2018 2355   MONOABS 0.6 12/24/2018 2355   EOSABS 0.3 12/24/2018 2355   BASOSABS 0.1 12/24/2018 2355    BMET    Component Value Date/Time   NA 137 12/30/2018 0803   K 4.2 12/30/2018 0803   CL 99 12/30/2018 0803   CO2 21 (L) 12/30/2018 0803   GLUCOSE 105 (H) 12/30/2018 0803   BUN 57 (H) 12/30/2018 0803   CREATININE 10.52 (H) 12/30/2018 0803   CALCIUM 8.3 (L) 12/30/2018 0803   GFRNONAA 5 (L) 12/30/2018 0803   GFRAA 6 (L) 12/30/2018 0803    INR    Component Value Date/Time   INR 1.1  12/26/2018 0305     Intake/Output Summary (Last 24 hours) at 12/30/2018 1031 Last data filed at 12/29/2018 1300 Gross per 24 hour  Intake 120 ml  Output -  Net 120 ml   Assessment/Plan:  43 y.o. male is s/p left arm AV graft placement with increase in pain, edema, and redness of left arm 4 Days Post-Op   - Left upper arm AV graft is patent with a palpable thrill and audible bruit - Despite radial nerve distribution numbness in left hand, patient has a palpable left radial pulse and satisfactory cap refill; he has been started on Neurontin during hospitalization - Edema left upper arm and erythema along tunneled track of graft however no drainage from incisions, purulence, fever, or white count - Agree with Keflex regimen - Pain control with Percocet - Encouraged patient to elevate his left arm during the day -This case will be discussed with on-call vascular surgeon Dr. Scot Dock who will evaluate the patient later today   Dagoberto Ligas, PA-C [facility] [phone] 12/30/2018 10:31 AM  I have interviewed the patient and examined the patient. I agree with the findings by the PA.  On my exam he has some mild swelling and ecchymosis around his tunnel site.  He does  have a palpable radial pulse.  He is now on p.o. Keflex although will be awful early for a graft infection.  We will follow to be sure that this is improving and can follow-up as an outpatient if needed.  Gae Gallop, MD [phone]

## 2018-12-30 NOTE — Procedures (Signed)
Patient seen and examined on Hemodialysis. BP (!) 143/78   Pulse 84   Temp 98.2 F (36.8 C) (Oral)   Resp 12   Ht 5\' 1"  (1.549 m)   Wt 49.4 kg   SpO2 100%   BMI 20.58 kg/m   QB 400 mL/ min, UF goal 2L.  LUE AVG with some erythema and redness- starting PO Keflex, VVS to evaluate and appreciate assistance.  Tolerating treatment without complaints at this time.   Madelon Lips MD [facility] pgr [phone] 9:47 AM

## 2018-12-30 NOTE — Progress Notes (Signed)
Accepted at [place] .1st treatment is: Thursday,March 12,2020 at 12:40pm .Schedule and chair time is: Tuesday,Thursday,Saturday at 12:40pm

## 2018-12-30 NOTE — Progress Notes (Addendum)
   Subjective: No events overnight. Mr. Silvera is receiving dialysis during our interview. Mr. Tangen continues to have some numbness in his left hand, but his strength is improving. He complains of worsening pain in his left upper arm, near the vascular surgery procedure site. His arm feels more swollen and hurts more to move around.   Objective:  Vital signs in last 24 hours: Vitals:   12/30/18 1045 12/30/18 1100 12/30/18 1115 12/30/18 1130  BP: (!) 154/89 (!) 142/84 (!) 155/88 (!) 143/86  Pulse: 90 88 86 88  Resp: 12 14 12 14   Temp:      TempSrc:      SpO2:      Weight:      Height:       Physical exam: General: Patient receiving hemodialysis. Awake and alert Cardiovascular: regular rate and rhythm. No murmurs appreciated. Capillary refill < 2 seconds. No lower extremity edema. Respiratory: lungs are clear to auscultation bilaterally Extremities: Left upper arm is warm, indurated, and tender over the AVG site, thrill is appreciated. Numbness over palmar aspect of left thumb, pointer finger, and middle finger. Grip strength is 5/5 bilaterally.  Assessment/Plan:  Principal Problem:   End stage renal disease ([place]) Active Problems:   Acute respiratory failure with hypoxia (HCC)   Hypertension   Renal osteodystrophy  Mr. 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    Acute respiratory failure with hypoxia - resolved  ESRD on HD T/Th/Sat Euvolemic on exam.Greatly appreciate nephrologyrecommendations.  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 On 3/10, it was noted that Mr. Givler AVG site was increasingly swollen, tender,  erythematous, and indurated concerning for infection. Pt is afebrile. Started on keflex per Nephrology. Appreciate vascular recommendations. 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 --Continue aspirin 81mg  daily -- Continuehomexarelto 2.5mg  bid  HTN - Continue withamlodipine 5mg  qd, coreg 25mg  bid, hydralazine 25mg  q8h  Chronic back pain. Stable. -Continuetylenol, lidocaine patch, k pad  Right lateral malleolus wound -wound care saw pt on 3/5 and dressed wound with xeroform antimicrobial dressing w/ instructions to change dressing daily  Dispo: Anticipated discharge pending CLIP  LOS: 5 days   Noralyn Pick, Medical Student 12/30/2018, 11:43 AM Pager: [phone]  Attestation for Student Documentation:  I personally was present and performed or re-performed the history, physical exam and medical decision-making activities of this service and have verified that the service and findings are accurately documented in the student's note.  Velna Ochs, MD 12/30/2018, 12:06 PM

## 2018-12-30 NOTE — Progress Notes (Signed)
[name] Progress Note   Subjective:  Seen on HD.  He reports numbness/ tingling L hand and increasing redness/ warmth of L arm where graft placed.    Objective Vitals:   12/30/18 0815 12/30/18 0830 12/30/18 0845 12/30/18 0900  BP: (!) 147/84 133/75 140/76 (!) 143/78  Pulse: 94 86 89 84  Resp: 18 12 12 12   Temp: 98.2 F (36.8 C)     TempSrc: Oral     SpO2: 100%     Weight: 49.4 kg     Height:        Physical Exam General: WNWD male NAD Heart: RRR no murmur, no rub Lungs: CTAB Abdomen: soft NTND Extremities: No LE edema  Dialysis Access: R IJ TDC, New LUE AVG +bruit with swelling/ redness/ warmth over graft.  L fingers pale nail beds but are warm.  Weak radial pulse  Wt Readings from Last 3 Encounters:  12/30/18 49.4 kg   Weight change: 0.411 kg   Additional Objective Labs: Basic Metabolic Panel: Recent Labs  Lab 12/25/18 0426 12/26/18 0305 12/28/18 0928 12/30/18 0803  NA  --  138 137 137  K  --  4.0 3.7 4.2  CL  --  101 99 99  CO2  --  24 24 21*  GLUCOSE  --  88 99 105*  BUN  --  36* 29* 57*  CREATININE  --  6.58* 5.68* 10.52*  CALCIUM  --  8.2* 8.4* 8.3*  PHOS 8.9*  --  6.3* 10.2*   CBC: Recent Labs  Lab 12/23/18 1055 12/24/18 2355  12/25/18 0413 12/26/18 0305 12/30/18 0803  WBC 8.3 14.2*  --  12.1* 8.6 9.1  NEUTROABS  --  12.4*  --   --   --   --   HGB 11.0* 12.3*   < > 9.9* 9.9* 8.9*  HCT 34.9* 39.3   < > 31.2* 31.3* 28.5*  MCV 91.6 90.8  --  90.2 88.7 90.5  PLT 185 214  --  191 123* 178   < > = values in this interval not displayed.   Blood Culture No results found for: SDES, SPECREQUEST, CULT, REPTSTATUS   Medications: . sodium chloride    . sodium chloride    . sodium chloride 10 mL/hr at 12/27/18 0400  . [START ON 12/31/2018] sodium chloride    . [START ON 12/31/2018] sodium chloride    . ferric gluconate (FERRLECIT/NULECIT) IV     . amLODipine  5 mg Oral Daily  . aspirin EC  81 mg Oral Daily  . atorvastatin  80  mg Oral QHS  . calcitRIOL  0.25 mcg Oral Q T,Th,Sa-HD  . carvedilol  25 mg Oral BID WC  . Chlorhexidine Gluconate Cloth  6 each Topical Q0600  . feeding supplement (PRO-STAT SUGAR FREE 64)  30 mL Oral BID  . gabapentin  300 mg Oral QHS  . hydrALAZINE  25 mg Oral Q8H  . lidocaine  1 patch Transdermal Q24H  . multivitamin  1 tablet Oral QHS  . rivaroxaban  2.5 mg Oral BID  . sevelamer carbonate  800 mg Oral TID WC    Dialysis Orders:  From previous unit in [place] 4.5hrs - TTS 2K 2.5Ca, 400/AF 1.5 EDW 47kg Access: R IJ TDC, LUE AVG placed 3/6 Hep 2100 unit bolus + 2000 unit mid run Calcitriol 0.25 mcg qHD  Assessment/Plan: 1. Acute respiratory failure/volume overload 2/2 to missed HD. Resolved with HD. Probing for EDW 2. ESRD - HD  TTS. Relocated to [facility] without OP center. Awaiting CLIP to new center. New LUE AVG placed 3/6 per Dr. Carlis Abbott. Gabapentin 300 started per VVS for L hand pain/numbness.  Next HD 3/10 on schedule. Will ask VVS to eval graft- possible steal and appears to possibly be cellulitic, start PO Keflex for now..   3. HTN/volume- BP controlled. On amlodipine 5mg  carvedilol 25 bid, hydralazine 25 bid. Appears euvolemic on exam. Tolerating 2-3LUF with HD.  UF to EDW.  4. Anemia- Hgb 9.9. Tsat 9%. Fe bolus started with HD.  5. Metabolic Bone Disease- Ca ok. Phos improving. On Renvela 1 TID. PTH controlled.  6. Nutrition - Prot supp for low albumin. Renal diet/vitamins 7. PVD. s/p aortobifemoral bypass w/redo.  Madelon Lips MD [facility] pgr [phone] 12/30/2018,9:40 AM  LOS: 5 days

## 2018-12-31 MED ORDER — CEPHALEXIN 250 MG PO CAPS: 250.0000 mg | ORAL_CAPSULE | Freq: Two times a day (BID) | ORAL | 0 refills | Status: DC

## 2018-12-31 MED ORDER — SEVELAMER CARBONATE 800 MG PO TABS: 1600.0000 mg | ORAL_TABLET | Freq: Three times a day (TID) | ORAL | 0 refills | Status: DC

## 2018-12-31 MED ORDER — SEVELAMER CARBONATE 800 MG PO TABS
1600.0000 mg | ORAL_TABLET | Freq: Three times a day (TID) | ORAL | Status: DC
  Administered 2018-12-31 – 2019-01-07 (×16): 1600 mg via ORAL
  Filled 2018-12-31 (×18): qty 2

## 2018-12-31 MED FILL — CEPHALEXIN 250 MG CAPSULE: 250 | 6 days supply | Qty: 12 | Fill #0

## 2018-12-31 MED FILL — SEVELAMER CARBONATE 800MG: 800 | 30 days supply | Qty: 180 | Fill #0

## 2018-12-31 NOTE — Social Work (Addendum)
CSW received call from patient's cousin. Cousin concerned, stating they had nowhere to go when patient is discharged from the hospital. They have tried calling the shelters on the list provided by another CSW earlier this week, and have not found a bed.   CSW met with patient at bedside. No family at bedside. Patient reported he and some family members moved down here from [place], [place] about a week ago. They had been staying in a motel until patient's admission, but have no more money to continue staying in the motel. Patient stated he just called his father, who lives in [place], but he cannot stay with his father.   CSW discussed [facility] program referral ([facility] can temporarily place patients in hotel and provide case management). Made patient aware that patient would be the only one allowed to stay in the hotel room if accepted; his family would have to make other arrangements. Patient agreeable to referral. Made referral to [facility]. Awaiting follow up to determine if they will accept patient.   Estanislado Emms, LCSW [phone]

## 2018-12-31 NOTE — Progress Notes (Signed)
[facility]  Subjective  - Some ongoing numbness in left first 3 digits.   Objective 127/80 82 98 F (36.7 C) (!) 9 95%  Intake/Output Summary (Last 24 hours) at 12/31/2018 1003 Last data filed at 12/30/2018 1215 Gross per 24 hour  Intake -  Output 2133 ml  Net -2133 ml    Left graft good thrill Left radial pulse palpable  Laboratory Lab Results: Recent Labs    12/30/18 0803  WBC 9.1  HGB 8.9*  HCT 28.5*  PLT 178   BMET Recent Labs    12/30/18 0803  NA 137  K 4.2  CL 99  CO2 21*  GLUCOSE 105*  BUN 57*  CREATININE 10.52*  CALCIUM 8.3*    COAG Lab Results  Component Value Date   INR 1.1 12/26/2018   No results found for: PTT  Assessment/Planning:  Agree with Dr. Scot Dock and plan for PO antibiotics.  Afebrile and WBC normal.  Some erythema and swelling along tunnel site, but early for graft infection.  Graft with good thrill.  Marty Heck 12/31/2018 10:03 AM --

## 2018-12-31 NOTE — Progress Notes (Signed)
Brett Small Progress Note   Subjective:  Sleeping in room. No CP/SOB. L upper arm remains sore.   Objective Vitals:   12/30/18 1307 12/30/18 1539 12/30/18 2057 12/31/18 0520  BP: 140/89 124/73 112/66 119/68  Pulse: 97 85 78 75  Resp: 16 12 16 16   Temp: 98.6 F (37 C) 99.3 F (37.4 C) 99.7 F (37.6 C) 98 F (36.7 C)  TempSrc: Oral Oral Oral   SpO2: 98% 94% 97% 95%  Weight:    47.4 kg  Height:        Physical Exam General: WNWD male NAD Heart: RRR no murmur, no rub Lungs: CTAB Abdomen: soft NTND Extremities: No LE edema  Dialysis Access: R IJ TDC, New LUE AVG +bruit with swelling/ redness/ warmth over graft.  L fingers pale nail beds but are warm.  Weak radial pulse  Wt Readings from Last 3 Encounters:  12/31/18 47.4 kg   Weight change: 0.5 kg   Additional Objective Labs: Basic Metabolic Panel: Recent Labs  Lab 12/25/18 0426 12/26/18 0305 12/28/18 0928 12/30/18 0803  NA  --  138 137 137  K  --  4.0 3.7 4.2  CL  --  101 99 99  CO2  --  24 24 21*  GLUCOSE  --  88 99 105*  BUN  --  36* 29* 57*  CREATININE  --  6.58* 5.68* 10.52*  CALCIUM  --  8.2* 8.4* 8.3*  PHOS 8.9*  --  6.3* 10.2*   CBC: Recent Labs  Lab 12/24/18 2355  12/25/18 0413 12/26/18 0305 12/30/18 0803  WBC 14.2*  --  12.1* 8.6 9.1  NEUTROABS 12.4*  --   --   --   --   HGB 12.3*   < > 9.9* 9.9* 8.9*  HCT 39.3   < > 31.2* 31.3* 28.5*  MCV 90.8  --  90.2 88.7 90.5  PLT 214  --  191 123* 178   < > = values in this interval not displayed.   Blood Culture No results found for: SDES, SPECREQUEST, CULT, REPTSTATUS   Medications: . sodium chloride    . sodium chloride    . sodium chloride 10 mL/hr at 12/27/18 0400  . ferric gluconate (FERRLECIT/NULECIT) IV 125 mg (12/30/18 1151)   . amLODipine  5 mg Oral Daily  . aspirin EC  81 mg Oral Daily  . atorvastatin  80 mg Oral QHS  . calcitRIOL  0.25 mcg Oral Q T,Th,Sa-HD  . carvedilol  25 mg Oral BID WC  . cephALEXin  250 mg  Oral Q12H  . Chlorhexidine Gluconate Cloth  6 each Topical Q0600  . feeding supplement (PRO-STAT SUGAR FREE 64)  30 mL Oral BID  . gabapentin  300 mg Oral QHS  . hydrALAZINE  25 mg Oral Q8H  . lidocaine  1 patch Transdermal Q24H  . multivitamin  1 tablet Oral QHS  . rivaroxaban  2.5 mg Oral BID  . sevelamer carbonate  800 mg Oral TID WC    Dialysis Orders:  From previous unit in [place] 4.5hrs - TTS 2K 2.5Ca, 400/AF 1.5 EDW 47kg Access: R IJ TDC, LUE AVG placed 3/6 Hep 2100 unit bolus + 2000 unit mid run Calcitriol 0.25 mcg qHD  Assessment/Plan: 1. Acute respiratory failure/volume overload 2/2 to missed HD. Resolved with HD. Probing for EDW 2. ESRD - HD TTS. Relocated to [facility] without OP center. Has outpatient spot [place] TTS 2nd shift. First treatment on 01/01/19.  New LUE AVG  placed 3/6 per Dr. Carlis Abbott. Gabapentin 300 started per VVS for L hand pain/numbness.   Concerns for possible steal and appears to possibly be cellulitic,  PO Keflex started. VVS following. CTM.  3. HTN/volume- BP controlled. On amlodipine 5mg  carvedilol 25 bid, hydralazine 25 bid. Appears euvolemic on exam. Tolerating 2-3LUF with HD.  4. Anemia- Hgb 8.9 Tsat 9%. Fe bolus started with HD.   5. Metabolic Bone Disease- Ca ok. Phos uncontrolled.  Increase Renvela 1>2 tabs TID. PTH at goal. 6. Nutrition - Prot supp for low albumin. Renal diet/vitamins 7. PVD. s/p aortobifemoral bypass w/redo.  Lynnda Child PA-C [facility] Pager [phone] 12/31/2018,8:55 AM

## 2018-12-31 NOTE — Discharge Summary (Addendum)
Name: Brett Small MRN: [id number] DOB: 1976/10/19 43 y.o. PCP: Patient, [name]  Date of Admission: 12/24/2018 11:19 PM Date of Discharge: 01/07/2019 Attending Physician: Oda Kilts, MD  Discharge Diagnosis: 1. ESRD on HD T/Th/Sat 2. Depression  Discharge Medications: Allergies as of 01/07/2019      Reactions   Codeine Nausea Only   Patient questioned this (??)   Sulfa Antibiotics Hives, Nausea And Vomiting      Medication List    STOP taking these medications   Xarelto 2.5 MG Tabs tablet Generic drug:  rivaroxaban     TAKE these medications   acetaminophen 325 MG tablet Commonly known as:  TYLENOL Take 650 mg by mouth every 6 (six) hours as needed for mild pain or headache.   amLODipine 5 MG tablet Commonly known as:  NORVASC Take 5 mg by mouth daily. hold if Systolic reading is <390   apixaban 2.5 MG Tabs tablet Commonly known as:  ELIQUIS Take 1 tablet (2.5 mg total) by mouth 2 (two) times daily.   aspirin EC 81 MG tablet Take 81 mg by mouth daily.   atorvastatin 80 MG tablet Commonly known as:  LIPITOR Take 80 mg by mouth at bedtime.   carvedilol 25 MG tablet Commonly known as:  COREG Take 25 mg by mouth 2 (two) times daily with a meal.   escitalopram 10 MG tablet Commonly known as:  LEXAPRO Take 1 tablet (10 mg total) by mouth daily. Start taking on:  January 08, 2019   gabapentin 300 MG capsule Commonly known as:  NEURONTIN Take 300 mg by mouth 3 (three) times daily.   hydrALAZINE 25 MG tablet Commonly known as:  APRESOLINE Take 25 mg by mouth every 8 (eight) hours.   hydrOXYzine 25 MG tablet Commonly known as:  ATARAX/VISTARIL Take 25 mg by mouth 3 (three) times daily as needed for anxiety.   NEPHRO-VITE PO Take 1 tablet by mouth daily.   sevelamer carbonate 800 MG tablet Commonly known as:  RENVELA Take 2 tablets (1,600 mg total) by mouth 3 (three) times daily with meals.       Disposition and follow-up:   Mr.Faizan  Demicco was discharged from [facility] in Stable condition.  At the hospital follow up visit please address:  1.  End Stage Renal Failure on HD: Discharged with arrangements for outpatient HD at [facility], T/Th/Sa shift. Currently dialyzing through temporary right subclavian HD catheter. S/p LUE AVG on 12/26/18. Discharged to [facility], will resume HD there.  2. Depression: With suicidal ideation and thoughts of overdosing on his medications. Psychiatry was consulted and started lexapro 10 mg daily and recommended inpatient psychiatry admission. Patient is considered very high risk as he has no safety net outside of the hospital and no safety plan for discharge.   3. PVD S/p aortobifemoral bypass complicated by thrombosis requiring embolectomy and re-do in January of 2020: ASA 81 mg daily was continued. His xarleto 2.5 mg BID (started by his prior vascular surgeon) was changed to Eliquis 2.5 mg BID per nephrology recommendations. He was not felt to be a good candidate for warfarin due to social disposition and inability to follow up reliably for INR monitoring.   3.  Labs / imaging needed at time of follow-up: none  4.  Pending labs/ test needing follow-up: none  Follow-up Appointments: Follow-up Information    [facility] [place] [facility].   Contact information: [address] [place] [address] [phone]  Hospital Course by problem list:  ESRD on HD: Patient presented on 12/25/18 with pulmonary edema due to missing HD. He had recently moved to [facility] but did not set up outpatient dialysis prior to moving. He was admitted, placed on BiPAP, and improved with urgent HD. He is receiving HD through a temporary right subclavian catheter. Vascular surgery was consulted for permanent access. He had a LUE AVG placed on 12/26/18. Arrangements were mae for outpatient HD at [facility], T/Th/Sa shift.  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 He is not a good candidate for warfarin due to homelessness and inability to follow up reliably for INR testing. His xarelto was changed to eliquis 2.5 mg BID per nephrology recommendations.   Gore-Tex reaction Patient developed swelling and erythema post operatively over his left AVG. He remained afebrile without leukocytosis. Vascular surgery felt this was consistent with a reaction to the graft, and less likely cellulitis, but he was covered empirically with antibiotics for 4 days. Swelling and erythema resolved and antibiotics were discontinued.   Median Nerve Damage Unfortunately patient sustained some median nerve damage during his AVG surgery resulting in paresthesias of his fingers. Strength remained intact. He was started on gabapentin 300 mg QHS per vascular.   Depression During his hospitalization, Mr. Dollar endorsed feelings of hopelessness regarding his medical condition and homelessness. Although he had been medically cleared for discharge, he had no safe plan for where to go after discharge or how to get to outpatient dialysis. He called multiple homeless shelters in [place], non of which were accepting new residents. He applied for a boarding home in [place] but was denied. Social work applied for the [employer], unfortunately he was not approved. He felt abandoned by his family and that no one cared about him. He initially moved to [place] to live with his cousin, but unfortunately his cousin lost housing and he has been unable to get in touch with him since hospitalization. He feels that his family has "left him behind". Patient endorsed a generalized death wish and that he sometimes felt like "just taking a whole bottle of pills and ending it". He  was felt to be very high risk as he does not have a safety net outside of the hospital and no safety plan. Psychiatry was consulted and recommended lexapro 10 mg and inpatient psychiatry hospitalization.  Social: Unfortunately, he is in a very challenging situation as described above where he moved from [place] without transferring his outpatient dialysis and his housing plan has fallen through.  He is currently homeless and has had no success in finding a shelter or boardinghouse that will take him.  Prior to discharge, it is imperative that he has a safe place to stay, as well as transportation to dialysis.  Discharge Vitals:   BP (!) 97/53 (BP Location: Right Arm)   Pulse 77   Temp 98.4 F (36.9 C) (Oral)   Resp 18   Ht 5\' 1"  (1.549 m)   Wt 45.5 kg   SpO2 97%   BMI 18.95 kg/m   Pertinent Labs, Studies, and Procedures:  CBC Latest Ref Rng & Units 01/06/2019 01/03/2019 12/30/2018  WBC 4.0 - 10.5 K/uL 10.1 6.4 9.1  Hemoglobin 13.0 - 17.0 g/dL 10.8(L) 9.2(L) 8.9(L)  Hematocrit 39.0 - 52.0 % 33.4(L) 28.5(L)  28.5(L)  Platelets 150 - 400 K/uL 228 193 178   CMP Latest Ref Rng & Units 01/06/2019 01/03/2019 12/30/2018  Glucose 70 - 99 mg/dL 110(H) 84 105(H)  BUN 6 - 20 mg/dL 58(H) 43(H) 57(H)  Creatinine 0.61 - 1.24 mg/dL 12.11(H) 9.36(H) 10.52(H)  Sodium 135 - 145 mmol/L 135 133(L) 137  Potassium 3.5 - 5.1 mmol/L 3.9 3.8 4.2  Chloride 98 - 111 mmol/L 97(L) 94(L) 99  CO2 22 - 32 mmol/L 23 23 21(L)  Calcium 8.9 - 10.3 mg/dL 8.6(L) 9.0 8.3(L)  Total Protein 6.5 - 8.1 g/dL - - -  Total Bilirubin 0.3 - 1.2 mg/dL - - -  Alkaline Phos 38 - 126 U/L - - -  AST 15 - 41 U/L - - -  ALT 0 - 44 U/L - - -    Result Date: 12/24/2018 CLINICAL DATA:  Shortness of breath EXAM: PORTABLE CHEST 1 VIEW COMPARISON:  None. FINDINGS: Right-sided central venous catheter tip over the SVC. No pleural effusion. Moderate-to-marked bilateral interstitial, ground glass opacity and consolidations right greater than left.  Normal heart size. No pneumothorax. IMPRESSION: Moderate-to-marked bilateral right greater than left interstitial and ground-glass opacity, which may reflect pulmonary edema or diffuse infection. Electronically Signed   By: Donavan Foil M.D.   On: 12/24/2018 23:47    Discharge Instructions: Discharge Instructions    Call MD for:  difficulty breathing, headache or visual disturbances   Complete by:  As directed    Call MD for:  redness, tenderness, or signs of infection (pain, swelling, redness, odor or green/yellow discharge around incision site)   Complete by:  As directed    Call MD for:  temperature >100.4   Complete by:  As directed    Diet - low sodium heart healthy   Complete by:  As directed    Diet - low sodium heart healthy   Complete by:  As directed    Discharge instructions   Complete by:  As directed    Mr. Haris,  It was a pleasure to participate in your care during this hospitalization. I am encouraged that you were able to recover so quickly after initiating dialysis in [place]. Now that you are set up with dialysis in your new hometown, I am hopeful that you will be able to avoid further hospitalizations. It will be important not to miss your dialysis sessions, which are scheduled for Tuesday/Thursday/Saturday. Even missing one dialysis session can cause you great harm. Additionally, it is important to avoid a high salt diet at home. Try to look at food labels for their Sodium content. Try to keep your sodium consumption beneath the suggested daily value of 2g (found on food labels). Processed foods and fast food generally have higher sodium content than fresh fruits and vegetables.   You were given an antibiotic during this hospitalization due to concern for infection around your dialysis graft. This has improved and likely represents a reaction to the graft itself rather than an infection. You do not need to take any more antibiotics. However if your arm becomes more  swollen or painful, you or if you develop a fever, you should call your primary care provider.  I am encouraged by your progress, and wish you well as you come to call [facility] home.  Best,  Adline Potter & Dr. Philipp Ovens   Discharge patient   Complete by:  As directed    Discharge disposition:  [place] Not Defined   Discharge patient date:  01/07/2019   Increase activity slowly   Complete by:  As directed    Increase activity slowly   Complete by:  As directed       Signed: Dewayne Hatch, MD 01/07/2019, 4:21 PM   Pager: [phone]   Internal Medicine Attending Note:  I saw and examined the patient on the day of discharge. I reviewed and agree with the discharge summary written by the house staff.  Lenice Pressman, M.D., Ph.D.

## 2018-12-31 NOTE — Progress Notes (Addendum)
   Subjective: No events overnight. [name] slept well. He continues to have pain is his left arm. The pain is the same as yesterday. Otherwise, he feels fine. He denies fevers, chills, sweating, shortness of breath, or chest pain. He is scheduled for outpatient dialysis starting tomorrow and is confident that he'll have the transportation to get there. He has a place to go when he leaves the hospital.  Objective:  Vital signs in last 24 hours: Vitals:   12/30/18 2057 12/31/18 0520 12/31/18 0916 12/31/18 0917  BP: 112/66 119/68 127/80 127/80  Pulse: 78 75  82  Resp: 16 16  (!) 9  Temp: 99.7 F (37.6 C) 98 F (36.7 C)    TempSrc: Oral     SpO2: 97% 95%    Weight:  47.4 kg    Height:       Physical exam: General: Patient is asleep but awakens easily and quickly becomes alert. Cardiovascular: regular rate and rhythm. No murmurs appreciated. Capillary refill < 2 seconds. No lower extremity edema. Respiratory: lungs are clear to auscultation bilaterally Extremities: Left upper arm is swollen, warm, indurated, erythematous, and tender over the AVG site, expanding from 2 inches inferior to the axilla to 1 inch above the elbow. Thrill is diminished from 3/10. Numbness over palmar aspect of left thumb, pointer finger, and middle finger. Grip strength is 5/5 bilaterally. Radial and ulnar pulses are 2+ bilaterally  Assessment/Plan:  Principal Problem:   End stage renal disease (HCC) Active Problems:   Acute respiratory failure with hypoxia (HCC)   Hypertension   Renal osteodystrophy  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    ESRD on HDT/Th/Sat Euvolemic on exam.Greatly appreciate  nephrologyrecommendations.  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 On 3/10, it was noted that Mr. Sellen AVG site was increasingly swollen, tender, erythematous, and indurated concerning for infection. Same or worse on 3/11. Pt is afebrile. Started on keflex per Nephrology. Appreciate vascularrecommendations. 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 --Continue aspirin 81mg  daily -- Continuehomexarelto 2.5mg  bid  HTN - Continue withamlodipine 5mg  qd, coreg 25mg  bid, hydralazine 25mg  q8h  Chronic back pain. Stable. -Continuetylenol, lidocaine patch, k pad  Right lateral malleolus wound -wound care saw pt on 3/5 and dressed wound with xeroform antimicrobial dressing w/ instructions to change dressing daily  Dispo: Anticipated discharge is today  LOS: 6 days   Brett Small, Medical Student 12/31/2018, 10:18 AM Pager: [phone]  Attestation for Student Documentation:  I personally was present and performed or re-performed the history, physical exam and medical decision-making activities of this service and have verified that the service and findings are accurately documented in the student's note.  Velna Ochs, MD 12/31/2018, 1:06 PM

## 2018-12-31 NOTE — Discharge Instructions (Addendum)
[facility]  Discharge Instructions  AV Fistula or Graft Surgery for Dialysis Access  Please refer to the following instructions for your post-procedure care. Your surgeon or physician assistant will discuss any changes with you.  Activity  You may drive the day following your surgery, if you are comfortable and no longer taking prescription pain medication. Resume full activity as the soreness in your incision resolves.  Bathing/Showering  You may shower after you go home. Keep your incision dry for 48 hours. Do not soak in a bathtub, hot tub, or swim until the incision heals completely. You may not shower if you have a hemodialysis catheter.  Incision Care  Clean your incision with mild soap and water after 48 hours. Pat the area dry with a clean towel. You do not need a bandage unless otherwise instructed. Do not apply any ointments or creams to your incision. You may have skin glue on your incision. Do not peel it off. It will come off on its own in about one week. Your arm may swell a bit after surgery. To reduce swelling use pillows to elevate your arm so it is above your heart. Your doctor will tell you if you need to lightly wrap your arm with an ACE bandage.  Diet  Resume your normal diet. There are not special food restrictions following this procedure. In order to heal from your surgery, it is CRITICAL to get adequate nutrition. Your body requires vitamins, minerals, and protein. Vegetables are the best source of vitamins and minerals. Vegetables also provide the perfect balance of protein. Processed food has little nutritional value, so try to avoid this.  Medications  Resume taking all of your medications. If your incision is causing pain, you may take over-the counter pain relievers such as acetaminophen (Tylenol). If you were prescribed a stronger pain medication, please be aware these medications can cause nausea and constipation. Prevent  nausea by taking the medication with a snack or meal. Avoid constipation by drinking plenty of fluids and eating foods with high amount of fiber, such as fruits, vegetables, and grains.  Do not take Tylenol if you are taking prescription pain medications.  Follow up Your surgeon may want to see you in the office following your access surgery. If so, this will be arranged at the time of your surgery.  Please call us immediately for any of the following conditions:  Increased pain, redness, drainage (pus) from your incision site Fever of 101 degrees or higher Severe or worsening pain at your incision site Hand pain or numbness.  Reduce your risk of vascular disease:  Stop smoking. If you would like help, call [facility] at 1-800-QUIT-NOW (215)559-8460) or [place] at [place] your cholesterol Maintain a desired weight Control your diabetes Keep your blood pressure down  Dialysis  It will take several weeks to several months for your new dialysis access to be ready for use. Your surgeon will determine when it is okay to use it. Your nephrologist will continue to direct your dialysis. You can continue to use your Permcath until your new access is ready for use.   12/26/2018 Keston Seever [id number] May 03, 1976  Surgeon(s): Marty Heck, MD  Procedure(s): INSERTION OF GORE STRETCH VASCULAR 4-7MM X  45CM IN LEFT UPPER ARM  x Do not stick graft for 4 weeks    If you have any questions, please call the office at [phone].  ------------------------------- Information on my medicine - XARELTO (Rivaroxaban)  What do you need to know about xarelto ? Take your Xarelto twice daily with food. If you have difficulty swallowing the tablet whole, you may crush it and mix in applesauce just prior to taking your dose.  Take Xarelto exactly as prescribed by your doctor and DO NOT stop taking Xarelto without talking to the doctor who prescribed the  medication.  Stopping without other stroke prevention medication to take the place of Xarelto may increase your risk of developing a clot that causes a stroke.  Refill your prescription before you run out.  After discharge, you should have regular check-up appointments with your healthcare provider that is prescribing your Xarelto.  In the future your dose may need to be changed if your kidney function or weight changes by a significant amount.  What do you do if you miss a dose? If you are taking Xarelto TWICE DAILY and you miss a dose, take it as soon as you remember. You may take two 15 mg tablets (total 30 mg) at the same time then resume your regularly scheduled 15 mg twice daily the next day.  If you are taking Xarelto ONCE DAILY and you miss a dose, take it as soon as you remember on the same day then continue your regularly scheduled once daily regimen the next day. Do not take two doses of Xarelto at the same time.   Important Safety Information A possible side effect of Xarelto is bleeding. You should call your healthcare provider right away if you experience any of the following: ? Bleeding from an injury or your nose that does not stop. ? Unusual colored urine (red or dark brown) or unusual colored stools (red or black). ? Unusual bruising for unknown reasons. ? A serious fall or if you hit your head (even if there is no bleeding).  Some medicines may interact with Xarelto and might increase your risk of bleeding while on Xarelto. To help avoid this, consult your healthcare provider or pharmacist prior to using any new prescription or non-prescription medications, including herbals, vitamins, non-steroidal anti-inflammatory drugs (NSAIDs) and supplements.  This website has more information on Xarelto: [email or web address]

## 2019-01-01 DIAGNOSIS — Z598 Other problems related to housing and economic circumstances: Secondary | ICD-10-CM

## 2019-01-01 DIAGNOSIS — L03114 Cellulitis of left upper limb: Secondary | ICD-10-CM

## 2019-01-01 DIAGNOSIS — T82898A Other specified complication of vascular prosthetic devices, implants and grafts, initial encounter: Secondary | ICD-10-CM

## 2019-01-01 MED ORDER — APIXABAN 2.5 MG PO TABS
2.5000 mg | ORAL_TABLET | Freq: Two times a day (BID) | ORAL | Status: DC
  Administered 2019-01-01 – 2019-01-07 (×12): 2.5 mg via ORAL
  Filled 2019-01-01 (×12): qty 1

## 2019-01-01 MED ORDER — SODIUM CHLORIDE 0.9 % IV SOLN
1.0000 g | INTRAVENOUS | Status: DC
  Administered 2019-01-01 – 2019-01-02 (×2): 1 g via INTRAVENOUS
  Filled 2019-01-01 (×3): qty 10

## 2019-01-01 MED ORDER — HEPARIN SODIUM (PORCINE) 1000 UNIT/ML IJ SOLN
INTRAMUSCULAR | Status: AC
  Filled 2019-01-01: qty 3

## 2019-01-01 NOTE — Progress Notes (Signed)
[facility] [place]  Subjective  - Some ongoing numbness in left first 3 digits.  No other complaints.  Seen in dialysis.   Objective 121/69 75 (!) 97.5 F (36.4 C) (Axillary) 10 98%  Intake/Output Summary (Last 24 hours) at 01/01/2019 1029 Last data filed at 12/31/2018 1700 Gross per 24 hour  Intake 480 ml  Output -  Net 480 ml    Left graft good thrill, some upper arm swelling Left radial pulse palpable  Laboratory Lab Results: Recent Labs    12/30/18 0803  WBC 9.1  HGB 8.9*  HCT 28.5*  PLT 178   BMET Recent Labs    12/30/18 0803  NA 137  K 4.2  CL 99  CO2 21*  GLUCOSE 105*  BUN 57*  CREATININE 10.52*  CALCIUM 8.3*    COAG Lab Results  Component Value Date   INR 1.1 12/26/2018   No results found for: PTT  Assessment/Planning:  May benefit from trial of IV antibiotics while here awaiting placement.  Some blanching erythema along tunnel that looks unchanged on PO keflex.  Afebrile and WBC normal.  Graft with good thrill.  Marty Heck 01/01/2019 10:29 AM --

## 2019-01-01 NOTE — Procedures (Signed)
I was present at this dialysis session. I have reviewed the session itself and made appropriate changes.   2L UF; 2K, using TDC; s/p LUE AVG. Disposition in process. Pt w/o complaints.  Should not continue rivaroxaban.    Filed Weights   12/30/18 1215 12/31/18 0520 01/01/19 0500  Weight: 47.4 kg 47.4 kg 47.9 kg    Recent Labs  Lab 12/30/18 0803  NA 137  K 4.2  CL 99  CO2 21*  GLUCOSE 105*  BUN 57*  CREATININE 10.52*  CALCIUM 8.3*  PHOS 10.2*    Recent Labs  Lab 12/26/18 0305 12/30/18 0803  WBC 8.6 9.1  HGB 9.9* 8.9*  HCT 31.3* 28.5*  MCV 88.7 90.5  PLT 123* 178    Scheduled Meds: . amLODipine  5 mg Oral Daily  . aspirin EC  81 mg Oral Daily  . atorvastatin  80 mg Oral QHS  . calcitRIOL  0.25 mcg Oral Q T,Th,Sa-HD  . carvedilol  25 mg Oral BID WC  . cephALEXin  250 mg Oral Q12H  . Chlorhexidine Gluconate Cloth  6 each Topical Q0600  . feeding supplement (PRO-STAT SUGAR FREE 64)  30 mL Oral BID  . gabapentin  300 mg Oral QHS  . hydrALAZINE  25 mg Oral Q8H  . lidocaine  1 patch Transdermal Q24H  . multivitamin  1 tablet Oral QHS  . rivaroxaban  2.5 mg Oral BID  . sevelamer carbonate  1,600 mg Oral TID WC   Continuous Infusions: . sodium chloride    . sodium chloride    . sodium chloride 10 mL/hr at 12/27/18 0400  . ferric gluconate (FERRLECIT/NULECIT) IV 125 mg (12/30/18 1151)   PRN Meds:.sodium chloride, sodium chloride, acetaminophen, alteplase, heparin, hydrOXYzine, lidocaine (PF), lidocaine-prilocaine, ondansetron **OR** ondansetron (ZOFRAN) IV, oxyCODONE, pentafluoroprop-tetrafluoroeth, sodium chloride   Pearson Grippe  MD 01/01/2019, 10:12 AM

## 2019-01-01 NOTE — Progress Notes (Addendum)
Subjective: Last night, social work paged and mentioned that Brett Small is struggling with housing availability after this hospitalization. He had been staying in a motel but has run out of money. The team was uncomfortable discharging him with outpatient dialysis scheduled for tomorrow if he would not have a place to stay or transportation to get to the dialysis center.  Brett Small is doing well this morning. His arm pain persists, but has diminished from previously. He has no shortness of breath, chest pain, or confusion. He is working on finding a place where he could stay after discharge.  Objective:  Vital signs in last 24 hours: Vitals:   01/30/2019 1401 30-Jan-2019 1632 January 30, 2019 2122 01/01/19 0500  BP: 135/84 112/69 127/81 121/69  Pulse:  82 83 75  Resp:  12 10   Temp:  98.2 F (36.8 C) 98.3 F (36.8 C) (!) 97.5 F (36.4 C)  TempSrc:  Oral Oral Axillary  SpO2:   98% 98%  Weight:    47.9 kg  Height:       Physical Exam: General: Patient is awake and alert, receiving hemodialysis. Cardiovascular: rrr. No murmurs. Capillary refill < 2 seconds. No lower extremity edema. Respiratory: lungs are cta bilaterally Extremities: Left upper arm is warm, indurated, and tender over the AVG site. Erythema is more pronounced from 01/30/23. Swelling is decreased. Thrill is diminished from 3/10. Numbness over palmar aspect of left thumb, pointer finger, and middle finger. Grip strength is 4/5 bilaterally.  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  He is un-housed and is applying for support through the HOPES program.   Acute respiratory failure with hypoxia- resolved ESRD on HDT/Th/Sat Euvolemic on exam.Greatly appreciate nephrologyrecommendations.  -HD T/Th/Sat -CLIP completed on 3/10. Patient is scheduled for opt. 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 Pt is afebrile. Started on keflex per Nephrology. Appreciate vascularrecommendations. 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 --Continue aspirin 81mg  daily -- Continuehomexarelto 2.5mg  bid  HTN - Continue withamlodipine 5mg  qd, coreg 25mg  bid, hydralazine 25mg  q8h  Chronic back pain. Stable. -Continuetylenol, lidocaine patch, k pad  Right lateral malleolus wound -wound care saw pt on 3/5 and dressed wound with xeroform antimicrobial dressing w/ instructions to change dressing daily  Dispo: Anticipated discharge pending SW HOPES program referral. Appreciate assistance.    LOS: 7 days   Noralyn Pick, Medical Student 01/01/2019, 10:23 AM Pager: [phone]  Attestation for Student Documentation:  I personally was present and performed or re-performed the history, physical exam and medical decision-making activities of this service and have verified that the service and findings are accurately documented in the students note.  Velna Ochs, MD 01/01/2019, 12:23 PM

## 2019-01-02 DIAGNOSIS — Z590 Homelessness: Secondary | ICD-10-CM

## 2019-01-02 NOTE — Progress Notes (Signed)
CSW spoke with MD about the patient. MD asked if the CSW could provide resources to the patient again. Patient stated that he had missed placed the original list.   CSW met with the patient at bedside. Patient located the original resources. CSW provided the patient with a shelter list and a [facility] medical transportation.   CSW signing off.   Brett Small, MSW, [place]

## 2019-01-02 NOTE — Progress Notes (Addendum)
  Progress Note    01/02/2019 10:11 AM 7 Days Post-Op  Subjective:  Still some soreness L upper arm.   Vitals:   01/02/19 0554 01/02/19 0815  BP: 103/79 124/75  Pulse: 71   Resp: 19   Temp: 97.9 F (36.6 C)   SpO2: 99%    Physical Exam: Lungs:  Non labored Incisions:  L axilla and AC fossa incision healing well Extremities:  Palpable L radial pulse; palpable thrill AVG Neurologic: A&O  CBC    Component Value Date/Time   WBC 9.1 12/30/2018 0803   RBC 3.15 (L) 12/30/2018 0803   HGB 8.9 (L) 12/30/2018 0803   HCT 28.5 (L) 12/30/2018 0803   PLT 178 12/30/2018 0803   MCV 90.5 12/30/2018 0803   MCH 28.3 12/30/2018 0803   MCHC 31.2 12/30/2018 0803   RDW 14.8 12/30/2018 0803   LYMPHSABS 0.8 12/24/2018 2355   MONOABS 0.6 12/24/2018 2355   EOSABS 0.3 12/24/2018 2355   BASOSABS 0.1 12/24/2018 2355    BMET    Component Value Date/Time   NA 137 12/30/2018 0803   K 4.2 12/30/2018 0803   CL 99 12/30/2018 0803   CO2 21 (L) 12/30/2018 0803   GLUCOSE 105 (H) 12/30/2018 0803   BUN 57 (H) 12/30/2018 0803   CREATININE 10.52 (H) 12/30/2018 0803   CALCIUM 8.3 (L) 12/30/2018 0803   GFRNONAA 5 (L) 12/30/2018 0803   GFRAA 6 (L) 12/30/2018 0803    INR    Component Value Date/Time   INR 1.1 12/26/2018 0305     Intake/Output Summary (Last 24 hours) at 01/02/2019 1011 Last data filed at 01/02/2019 0300 Gross per 24 hour  Intake 420 ml  Output 2000 ml  Net -1580 ml     Assessment/Plan:  43 y.o. male is s/p L AVG 7 Days Post-Op   Patent AVG with palpable L radial Over the past 72h since my last evaluation of L arm, edema improved and erythema has softened No indication for graft removal at this time Recheck CBC Continue Ancef   Dagoberto Ligas, PA-C [facility] [phone] 01/02/2019 10:11 AM  I have examined the patient, reviewed and agree with above.  Axillary and antecubital incisions healing nicely.  Appears to be typical "[place]  reaction".  Remains afebrile.  Curt Jews, MD 01/02/2019 1:21 PM

## 2019-01-02 NOTE — Progress Notes (Signed)
CSW received a phone call from [place] with the HOPES program. The patient doesn't meet the necessary criteria for the HOPES program. Patient has to have more of an acute medical condition in order to qualify. Rosaria Ferries stated that the funds for the program are limited and they cannot accept every patient with dialysis.   Patient will need to go back to motels or a shelter. Resources have already been provided to the patient along with a transportation list. Patient will need to be compliant with his dialysis.   Patient is able to discharge to a motel or shelter whenever he is medically stable.  No further social work needs at this time.  Domenic Schwab, MSW, [place]

## 2019-01-02 NOTE — Progress Notes (Signed)
Talked with the patient and he was very reluctant to go home as he does not has any place or family member to stay with. As he does not qualify for hopes program, asked him to make calls to the shelters to find a place.  Per patient he did not had those information's with him now.  Talked with social worker who is going to provide him with shelter and transportation information again.  It was made clear that he has to make calls to get in them.  Patient will stay today to get his scheduled dialysis on Saturday and will be discharged after dialysis tomorrow. Patient seems understanding.  Lorella Nimrod MD Pager [phone]

## 2019-01-02 NOTE — Progress Notes (Signed)
   Subjective: No events overnight. Brett Small slept well. His arm hurts about as much as on 3/12. He denies fever/chills, shortness of breath, or chest pain. He is looking for a place to go after this hospitalization.   Objective:  Vital signs in last 24 hours: Vitals:   01/01/19 1547 01/01/19 2100 01/02/19 0140 01/02/19 0554  BP: 105/66 100/67 116/69 103/79  Pulse:  67  71  Resp:    19  Temp:  98.5 F (36.9 C)  97.9 F (36.6 C)  TempSrc:  Oral  Oral  SpO2:  98%  99%  Weight:      Height:       Physical Exam: General: Patient is initially asleep but easily awakened and coherent. Cardiovascular: rrr. No murmurs. No LE edema. Respiratory: lungs are cta bilaterally. No crackles Extremities: Left upper arm is warm, indurated, and tender over the AVG site. Induration is worsened from 3/12. Erythema and tenderness are improved from 3/12. AVG thrill is appreciated. Numbness over palmar aspect of left thumb, pointer finger, and middle finger. Grip strength is 4/5 bilaterally.  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 He is un-housed and is applying for support through the HOPES program.   ESRD on HDT/Th/Sat Euvolemic on exam.Greatly appreciate nephrologyrecommendations.  -HD T/Th/Sat -CLIP completed on 3/10. Patient is scheduled for opt. 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 Has not helped pt's sx, can consider discontinuing   POD 5 s/p left upper arm AVG Cellulitis  On 3/10, it  was noted that Brett Small AVG site was increasingly swollen, tender, erythematous, and induratedconcerning for infection.Pt is afebrile.Started on keflex perNephrology.No improvement with keflex from 3/10-3/12. Switched to IV ceftriaxone. Appreciate vascularrecommendations.  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  --Continue aspirin 81mg  daily -- d/chomexarelto 2.5mg  bid 2/2 nephrology note -- initiate apixaban 2.5 mg bid  HTN - Continue withamlodipine 5mg  qd, coreg 25mg  bid, hydralazine 25mg  q8h  Chronic back pain. Stable. -Continuetylenol, lidocaine patch, k pad  Right lateral malleolus wound -wound care saw pt on 3/5 and dressed wound with xeroform antimicrobial dressing w/ instructions to change dressing daily  Dispo: Anticipated discharge pending SW HOPES program referral. Appreciate assistance  LOS: 8 days   Noralyn Pick, Medical Student 01/02/2019, 7:33 AM Pager: [phone]

## 2019-01-02 NOTE — Care Management Important Message (Signed)
Important Message  Patient Details  Name: Brett Small MRN: [id number] Date of Birth: [birth year]   Medicare Important Message Given:  Yes    Khaleem Burchill P Alzora Ha 01/02/2019, 3:00 PM

## 2019-01-02 NOTE — Progress Notes (Addendum)
CSW called and left a voicemail for [employer] at [place] also sent a message to the cellphone number. CSW is awaiting a return phone call for coordination for the patient. CSW will continue to follow.   Domenic Schwab, MSW, [place]

## 2019-01-02 NOTE — Progress Notes (Addendum)
[facility] Progress Note   Subjective: Quiet, no complaints.   Objective Vitals:   01/01/19 2100 01/02/19 0140 01/02/19 0554 01/02/19 0815  BP: 100/67 116/69 103/79 124/75  Pulse: 67  71   Resp:   19   Temp: 98.5 F (36.9 C)  97.9 F (36.6 C)   TempSrc: Oral  Oral   SpO2: 98%  99%   Weight:      Height:       Physical Exam General: WN,WD male in NAD Heart: S1,S2, RRR Lungs: CTAB A/P Abdomen: S, NT Extremities: No LE edema Dialysis Access: RIJ TDC Drsg CDI, RUA AVG still some erythema present, strong bruit.   Additional Objective Labs: Basic Metabolic Panel: Recent Labs  Lab 12/28/18 0928 12/30/18 0803  NA 137 137  K 3.7 4.2  CL 99 99  CO2 24 21*  GLUCOSE 99 105*  BUN 29* 57*  CREATININE 5.68* 10.52*  CALCIUM 8.4* 8.3*  PHOS 6.3* 10.2*   Liver Function Tests: Recent Labs  Lab 12/28/18 0928 12/30/18 0803  ALBUMIN 2.8* 2.7*   No results for input(s): LIPASE, AMYLASE in the last 168 hours. CBC: Recent Labs  Lab 12/30/18 0803  WBC 9.1  HGB 8.9*  HCT 28.5*  MCV 90.5  PLT 178   Blood Culture No results found for: SDES, SPECREQUEST, CULT, REPTSTATUS  Cardiac Enzymes: No results for input(s): CKTOTAL, CKMB, CKMBINDEX, TROPONINI in the last 168 hours. CBG: No results for input(s): GLUCAP in the last 168 hours. Iron Studies: No results for input(s): IRON, TIBC, TRANSFERRIN, FERRITIN in the last 72 hours. @lablastinr3 @ Studies/Results: No results found. Medications: . sodium chloride    . sodium chloride    . sodium chloride 10 mL/hr at 01/01/19 1403  . cefTRIAXone (ROCEPHIN)  IV 1 g (01/01/19 1405)  . ferric gluconate (FERRLECIT/NULECIT) IV 125 mg (01/01/19 1251)   . amLODipine  5 mg Oral Daily  . apixaban  2.5 mg Oral BID  . aspirin EC  81 mg Oral Daily  . atorvastatin  80 mg Oral QHS  . calcitRIOL  0.25 mcg Oral Q T,Th,Sa-HD  . carvedilol  25 mg Oral BID WC  . Chlorhexidine Gluconate Cloth  6 each Topical Q0600  . feeding  supplement (PRO-STAT SUGAR FREE 64)  30 mL Oral BID  . gabapentin  300 mg Oral QHS  . hydrALAZINE  25 mg Oral Q8H  . lidocaine  1 patch Transdermal Q24H  . multivitamin  1 tablet Oral QHS  . sevelamer carbonate  1,600 mg Oral TID WC     Dialysis Orders:  From previous unit in [place] 4.5hrs - TTS 2K 2.5Ca, 400/AF 1.5 EDW 47kg Access: R IJ TDC, LUE AVG placed 3/6 Hep 2100 unit bolus + 2000 unit mid run Calcitriol 0.25 mcg qHD  Assessment/Plan: 1. Acute respiratory failure/volume overload 2/2 to missed HD. Resolved with HD. Probing for EDW 2. ESRD - HD TTS. Relocated to [facility] without OP center. Has outpatient spot [place] TTS 2nd shift. Discharge delayed D/T homeless status. New LUE AVG placed 3/6 per Dr. Carlis Abbott. Gabapentin 300 started per [facility] for L hand pain/numbness.   Concerns for possible steal and appeared to possibly be cellulitic improving with PO Keflex [facility] following. Next HD here tomorrow. Doubtful he will be discharged today.  3. HTN/volume- BP controlled. On amlodipine 5mg  carvedilol 25 bid, hydralazine 25 bid. Appears euvolemic on exam. Tolerating 2-3LUF with HD.  4. Anemia- Hgb 8.9 Tsat 9%. Fe bolus started with HD.  5. Metabolic Bone Disease- Ca ok. Phos uncontrolled.  Increase Renvela 1>2 tabs TID. PTH at goal. 6. Nutrition - Prot supp for low albumin. Renal diet/vitamins 7. PVD. s/p aortobifemoral bypass w/redo. 8. Disposition-MSW consulted D/T homeless status. Will not send out until he has place to live and transportation to HD unit.   Rita H. Brown NP-C 01/02/2019, 11:10 AM  [employer] [phone]

## 2019-01-03 LAB — RENAL FUNCTION PANEL
Albumin: 3.3 g/dL — ABNORMAL LOW (ref 3.5–5.0)
Anion gap: 16 — ABNORMAL HIGH (ref 5–15)
BUN: 43 mg/dL — ABNORMAL HIGH (ref 6–20)
CO2: 23 mmol/L (ref 22–32)
Calcium: 9 mg/dL (ref 8.9–10.3)
Chloride: 94 mmol/L — ABNORMAL LOW (ref 98–111)
Creatinine, Ser: 9.36 mg/dL — ABNORMAL HIGH (ref 0.61–1.24)
GFR calc Af Amer: 7 mL/min — ABNORMAL LOW (ref >60)
GFR calc non Af Amer: 6 mL/min — ABNORMAL LOW (ref >60)
Glucose, Bld: 84 mg/dL (ref 70–99)
Phosphorus: 8.4 mg/dL — ABNORMAL HIGH (ref 2.5–4.6)
Potassium: 3.8 mmol/L (ref 3.5–5.1)
Sodium: 133 mmol/L — ABNORMAL LOW (ref 135–145)

## 2019-01-03 LAB — CBC
HCT: 28.5 % — ABNORMAL LOW (ref 39.0–52.0)
Hemoglobin: 9.2 g/dL — ABNORMAL LOW (ref 13.0–17.0)
MCH: 28.1 pg (ref 26.0–34.0)
MCHC: 32.3 g/dL (ref 30.0–36.0)
MCV: 87.2 fL (ref 80.0–100.0)
Platelets: 193 10*3/uL (ref 150–400)
RBC: 3.27 MIL/uL — ABNORMAL LOW (ref 4.22–5.81)
RDW: 14 % (ref 11.5–15.5)
WBC: 6.4 10*3/uL (ref 4.0–10.5)
nRBC: 0 % (ref 0.0–0.2)

## 2019-01-03 MED ORDER — SODIUM CHLORIDE 0.9 % IV SOLN: 100.0000 mL | INTRAVENOUS | Status: DC | PRN

## 2019-01-03 MED ORDER — HEPARIN SODIUM (PORCINE) 1000 UNIT/ML DIALYSIS
2100.0000 [IU] | Freq: Once | INTRAMUSCULAR | Status: AC
  Administered 2019-01-03: 2100 [IU] via INTRAVENOUS_CENTRAL

## 2019-01-03 MED ORDER — APIXABAN 2.5 MG PO TABS: 2.5000 mg | ORAL_TABLET | Freq: Two times a day (BID) | ORAL | 0 refills | Status: DC

## 2019-01-03 MED ORDER — HEPARIN SODIUM (PORCINE) 1000 UNIT/ML IJ SOLN
INTRAMUSCULAR | Status: AC
  Filled 2019-01-03: qty 4

## 2019-01-03 MED ORDER — CALCITRIOL 0.25 MCG PO CAPS
ORAL_CAPSULE | ORAL | Status: AC
  Filled 2019-01-03: qty 1

## 2019-01-03 NOTE — Plan of Care (Signed)
  Problem: Education: Goal: Knowledge of General Education information will improve Description Including pain rating scale, medication(s)/side effects and non-pharmacologic comfort measures Outcome: Progressing   Problem: Education: Goal: Knowledge of General Education information will improve Description Including pain rating scale, medication(s)/side effects and non-pharmacologic comfort measures Outcome: Progressing   Problem: Health Behavior/Discharge Planning: Goal: Ability to manage health-related needs will improve Outcome: Progressing   Problem: Clinical Measurements: Goal: Ability to maintain clinical measurements within normal limits will improve Outcome: Progressing Goal: Will remain free from infection Outcome: Progressing Goal: Diagnostic test results will improve Outcome: Progressing Goal: Cardiovascular complication will be avoided Outcome: Progressing   Problem: Nutrition: Goal: Adequate nutrition will be maintained Outcome: Progressing   Problem: Coping: Goal: Level of anxiety will decrease Outcome: Progressing   Problem: Clinical Measurements: Goal: Respiratory complications will improve Outcome: Completed/Met   Problem: Activity: Goal: Risk for activity intolerance will decrease Outcome: Completed/Met

## 2019-01-03 NOTE — Progress Notes (Signed)
[facility] Progress Note   Subjective: Seen on HD. Patient says he has called someone he doesn't really know and doesn't know if he can stay with this person-so no definite living arrangements yet. LUA AVG with erythema, still tender to palpation.      Objective Vitals:   01/03/19 0930 01/03/19 1000 01/03/19 1030 01/03/19 1100  BP: (!) 149/83 (!) 142/81 (!) 150/82 118/68  Pulse: 85 96 92 92  Resp:      Temp:      TempSrc:      SpO2:      Weight:      Height:       Physical Exam General: WN,WD male in NAD Heart: S1,S2, RRR Lungs: CTAB A/P Abdomen: S, NT Extremities: No LE edema Dialysis Access: RIJ TDC Drsg CDI-blood lines connected.  RUA AVG with erythema present, strong bruit.    Additional Objective Labs: Basic Metabolic Panel: Recent Labs  Lab 12/28/18 0928 12/30/18 0803 01/03/19 0850  NA 137 137 133*  K 3.7 4.2 3.8  CL 99 99 94*  CO2 24 21* 23  GLUCOSE 99 105* 84  BUN 29* 57* 43*  CREATININE 5.68* 10.52* 9.36*  CALCIUM 8.4* 8.3* 9.0  PHOS 6.3* 10.2* 8.4*   Liver Function Tests: Recent Labs  Lab 12/28/18 0928 12/30/18 0803 01/03/19 0850  ALBUMIN 2.8* 2.7* 3.3*   No results for input(s): LIPASE, AMYLASE in the last 168 hours. CBC: Recent Labs  Lab 12/30/18 0803 01/03/19 0337  WBC 9.1 6.4  HGB 8.9* 9.2*  HCT 28.5* 28.5*  MCV 90.5 87.2  PLT 178 193   Blood Culture No results found for: SDES, SPECREQUEST, CULT, REPTSTATUS  Cardiac Enzymes: No results for input(s): CKTOTAL, CKMB, CKMBINDEX, TROPONINI in the last 168 hours. CBG: No results for input(s): GLUCAP in the last 168 hours. Iron Studies: No results for input(s): IRON, TIBC, TRANSFERRIN, FERRITIN in the last 72 hours. @lablastinr3 @ Studies/Results: No results found. Medications: . sodium chloride    . sodium chloride    . sodium chloride 10 mL/hr at 01/02/19 1228  . sodium chloride    . sodium chloride    . cefTRIAXone (ROCEPHIN)  IV 1 g (01/02/19 1229)  . ferric  gluconate (FERRLECIT/NULECIT) IV 125 mg (01/03/19 1104)   . amLODipine  5 mg Oral Daily  . apixaban  2.5 mg Oral BID  . aspirin EC  81 mg Oral Daily  . atorvastatin  80 mg Oral QHS  . calcitRIOL      . calcitRIOL  0.25 mcg Oral Q T,Th,Sa-HD  . carvedilol  25 mg Oral BID WC  . Chlorhexidine Gluconate Cloth  6 each Topical Q0600  . feeding supplement (PRO-STAT SUGAR FREE 64)  30 mL Oral BID  . gabapentin  300 mg Oral QHS  . heparin  2,100 Units Dialysis Once in dialysis  . hydrALAZINE  25 mg Oral Q8H  . lidocaine  1 patch Transdermal Q24H  . multivitamin  1 tablet Oral QHS  . sevelamer carbonate  1,600 mg Oral TID WC     Dialysis Orders:  From previous unit in [place] 4.5hrs - TTS 2K 2.5Ca, 400/AF 1.5 EDW 47kg Access: R IJ TDC, LUE AVG placed 3/6 Hep 2100 unit bolus + 2000 unit mid run Calcitriol 0.25 mcg qHD  Assessment/Plan: 1. Acute respiratory failure/volume overload 2/2 to missed HD. Resolved with HD. Probing for EDW 2. ESRD - HD TTS. Relocated to [facility] without OP center.Has outpatient [place] TTS 2nd shift. Discharge delayed  D/T homeless status.New LUE AVG placed 3/6 per Dr. Carlis Abbott. Gabapentin 300 started per VVS for L hand pain/numbness.Concerns forpossible steal and appeared to possibly be cellulitic improving with PO Keflex VVS following. HD today on schedule  3. HTN/volume- BP controlled. On amlodipine 5mg  carvedilol 25 bid, hydralazine 25 bid. Appears euvolemic on exam. Pre wt 46.7 kg. UFG 2.0 liters today.  4. Anemia- Hgb9.2. Tsat 9%-continue Fe load.   5.Metabolic Bone Disease- Ca ok. Phosuncontrolled but coming down. Increase Renvela 1>2 tabs TID. PTHat goal. 6. Nutrition - Prot supp for low albumin. Renal diet/vitamins 7. PVD. s/p aortobifemoral bypass w/redo. 8. Disposition-MSW consulted D/T homeless status.Still no definite place to live and no transportation to HD unit.   Mohsin Crum H. Edmond Ginsberg NP-C 01/03/2019, 11:25 AM  [employer] [phone]

## 2019-01-03 NOTE — Progress Notes (Addendum)
Subjective: Yesterday, CSW informed us that Brett Small did not qualify for the HOPES program. He began looking for places to stay near [place] so he could continue to receive dialysis. No acute events overnight.  This morning, Brett Small is doing well, currently receiving dialysis. He has no complaints. His left arm pain and swelling have improved significantly. He denies fevers, chills, shortness of breath, or chest pain. He has found a friend to stay with in [place], starting on Monday. He thinks he will be able to find transportation from [place] to [place] for dialysis.  Objective:  Vital signs in last 24 hours: Vitals:   01/02/19 1357 01/02/19 1944 01/02/19 2350 01/03/19 0415  BP: 120/73 118/69 113/78 127/70  Pulse: 83 71  84  Resp: 17 13  12   Temp: 97.8 F (36.6 C) 98.3 F (36.8 C)  98.7 F (37.1 C)  TempSrc:  Oral  Oral  SpO2: 98% 98%  99%  Weight:    46.6 kg  Height:       CBC Latest Ref Rng & Units 01/03/2019 12/30/2018 12/26/2018  WBC 4.0 - 10.5 K/uL 6.4 9.1 8.6  Hemoglobin 13.0 - 17.0 g/dL 9.2(L) 8.9(L) 9.9(L)  Hematocrit 39.0 - 52.0 % 28.5(L) 28.5(L) 31.3(L)  Platelets 150 - 400 K/uL 193 178 123(L)    Physical exam: General: Patient undergoing dialysis, resting comfortably in bed, not distressed. Cardiovascular: rrr, no murmurs, no peripheral edema. Radial and ulnar pulses 2+ bilaterally Respiratory: lungs cta Extremities: Left upper arm is no longer warm. Erythema and swelling are improved. Mildly tender. AVG thrill is appreciated.  Neurologic: alert and oriented x3. Left hand has paresthesias in the thumb, pointer finger, and middle finger. 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 He has a place to stay starting on Monday, and understands that he is being discharged today.   ESRD on HDT/Th/Sat Euvolemic on exam.Greatly appreciate nephrologyrecommendations.  -HD T/Th/Sat -CLIP completed on 3/10. Patient is scheduled for opt. 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 Has not helped pt's sx, can consider discontinuing   POD 6 s/p left upper arm AVG AVG "Gore-Tex reaction" On 3/10, it was noted that [facility] AVG site was increasingly swollen, tender, erythematous, and induratedconcerning for infection.Pt is afebrile.Started on keflex perNephrology.No improvement with keflex from 3/10-3/12. Switched to IV ceftriaxone on 3/12. Vascular surgery suspected an inflammatory tissue reaction to the AVG Gore-Tex material. Patients symptoms and exam are significantly improved on 3/14. Appreciate vascularrecommendations. 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  --Continue aspirin 81mg  daily -- continue apixaban 2.5 mg bid  HTN - Continue withamlodipine 5mg  qd, coreg 25mg  bid, hydralazine 25mg  q8h  Chronic back pain. Stable. -Continuetylenol, lidocaine patch, k pad   Dispo: Anticipated discharge possibly today - however he currently does not have  living arrangements and unsure if he will be able to make it to outpatient dialysis on Tuesday. Will touch base with patient later today and nephrology. Social work has signed off. Will re consult if  needed.     LOS: 9 days   Noralyn Pick, Medical Student 01/03/2019, 7:41 AM Pager: [phone]  Attestation for Student Documentation:  I personally was present and performed or re-performed the history, physical exam and medical decision-making activities of this service and have verified that the service and findings are accurately documented in the student's note.   Velna Ochs, MD 01/03/2019, 11:55 AM

## 2019-01-04 DIAGNOSIS — Z5900 Homelessness unspecified: Secondary | ICD-10-CM

## 2019-01-04 MED ORDER — AMLODIPINE BESYLATE 2.5 MG PO TABS
2.5000 mg | ORAL_TABLET | Freq: Every day | ORAL | Status: DC
  Administered 2019-01-04 – 2019-01-06 (×3): 2.5 mg via ORAL
  Filled 2019-01-04 (×3): qty 1

## 2019-01-04 MED ORDER — AMLODIPINE BESYLATE 2.5 MG PO TABS: 2.5000 mg | ORAL_TABLET | Freq: Every day | ORAL | Status: DC

## 2019-01-04 NOTE — Progress Notes (Signed)
CSW acknowledges consult for homelessness. CSW has met with the patient on two separate times and provided a shelter list, boarding home list, affordable housing options, and a transportation list.   CSW is signing off, no further intervention is required at this time.   Domenic Schwab, MSW, [place]

## 2019-01-04 NOTE — Progress Notes (Signed)
Medicine attending: I examined this patient today together with resident physician Dr. Candace Cruise and I concur with her evaluation and management plan.  Soft tissue swelling and erythema has subsided over the area of recent cortex vascular graft placement proximal left arm. Outpatient dialysis center arranged but at present patient is homeless and has no transportation.  We will need ongoing care management assistance to make suitable arrangements for him. He continues on low-dose aspirin and modified low-dose anticoagulant status post thrombosis of aorto femoral bypass.

## 2019-01-04 NOTE — Progress Notes (Signed)
   Subjective: Patient still has been unable to find housing or a place to go. He has been in contact with a shelter or housing arrangement in [place], but unsure if he will be able to make it to his dialysis center in [place] without transportation.   Objective:  Vital signs in last 24 hours: Vitals:   01/03/19 1859 01/03/19 2033 01/04/19 0416 01/04/19 0753  BP: 92/61 (!) 105/52 94/61   Pulse: 67 63 65   Resp: 19 15 13 14   Temp:  98.3 F (36.8 C) 98.2 F (36.8 C)   TempSrc:  Oral Oral   SpO2: 98% 99% 98%   Weight:   45.5 kg   Height:       Physical Exam Constitutional: NAD, appears comfortable Cardiovascular: RRR, no murmurs, rubs, or gallops.  Pulmonary/Chest: CTAB, no wheezes, rales, or rhonchi.  Extremities: Warm and well perfused. LUE AV graft, erythema has improved. Palpable thrill.  Psychiatric: Normal mood and affect   Assessment/Plan:  Principal Problem:   End stage renal disease (HCC) Active Problems:   Acute respiratory failure with hypoxia (HCC)   Hypertension   Renal osteodystrophy  ESRD on HD T/Th/Sat: Relocated to [place] without outpatient dialysis center. He has been set up [facility], T/Th/Sa shift. Unfortunately unable to be discharged due to homelessness and no transportation to dialysis.  -- Nephrology consulted for dialysis needs; appreciate assistance  -- Re consult social work for placement   POD 7 Left Upper Extremity AVG: Complicated by mediate nerve damage during surgery now with neuropathy and "gore-tex reaction.  -- Erythema improved today; discontinue antibiotics  -- Continue gabapentin 300 mg qhs  PVD Thrombosis of aortobifemoral bypass requiring embolectomy and redo 10/2018 -- Continue ASA 81 daily -- Continue eliquis 2.5 mg BID  FEN: fluid restrict, lytes per dialysis, renal diet VTE ppx: Eliquis  Code Status: FULL   Dispo: Anticipated discharge pending safe plan for discharge and ability to attend  outpatient dialysis.  Velna Ochs, MD 01/04/2019, 10:30 AM Pager: [phone]

## 2019-01-04 NOTE — Progress Notes (Signed)
[facility] Progress Note   Subjective: Still no place to live. SBP on low side. Decreased swelling/erythema L AVG. No issues with HD 01/03/19.     Objective Vitals:   01/03/19 1859 01/03/19 2033 01/04/19 0416 01/04/19 0753  BP: 92/61 (!) 105/52 94/61   Pulse: 67 63 65   Resp: 19 15 13 14   Temp:  98.3 F (36.8 C) 98.2 F (36.8 C)   TempSrc:  Oral Oral   SpO2: 98% 99% 98%   Weight:   45.5 kg   Height:       Physical Exam General:WN,WD male in NAD Heart:S1,S2, RRR Lungs:CTAB A/P Abdomen:S, NT Extremities:No LE edema Dialysis Access:RIJ TDC Drsg CDI. LUA AVG looks better, decreased erythema/edema + bruit.    Additional Objective Labs: Basic Metabolic Panel: Recent Labs  Lab 12/30/18 0803 01/03/19 0850  NA 137 133*  K 4.2 3.8  CL 99 94*  CO2 21* 23  GLUCOSE 105* 84  BUN 57* 43*  CREATININE 10.52* 9.36*  CALCIUM 8.3* 9.0  PHOS 10.2* 8.4*   Liver Function Tests: Recent Labs  Lab 12/30/18 0803 01/03/19 0850  ALBUMIN 2.7* 3.3*   No results for input(s): LIPASE, AMYLASE in the last 168 hours. CBC: Recent Labs  Lab 12/30/18 0803 01/03/19 0337  WBC 9.1 6.4  HGB 8.9* 9.2*  HCT 28.5* 28.5*  MCV 90.5 87.2  PLT 178 193   Blood Culture No results found for: SDES, SPECREQUEST, CULT, REPTSTATUS  Cardiac Enzymes: No results for input(s): CKTOTAL, CKMB, CKMBINDEX, TROPONINI in the last 168 hours. CBG: No results for input(s): GLUCAP in the last 168 hours. Iron Studies: No results for input(s): IRON, TIBC, TRANSFERRIN, FERRITIN in the last 72 hours. @lablastinr3 @ Studies/Results: No results found. Medications: . sodium chloride    . sodium chloride    . sodium chloride 10 mL/hr at 01/02/19 1228  . ferric gluconate (FERRLECIT/NULECIT) IV 125 mg (01/03/19 1104)   . amLODipine  5 mg Oral Daily  . apixaban  2.5 mg Oral BID  . aspirin EC  81 mg Oral Daily  . atorvastatin  80 mg Oral QHS  . calcitRIOL  0.25 mcg Oral Q T,Th,Sa-HD  .  carvedilol  25 mg Oral BID WC  . Chlorhexidine Gluconate Cloth  6 each Topical Q0600  . feeding supplement (PRO-STAT SUGAR FREE 64)  30 mL Oral BID  . gabapentin  300 mg Oral QHS  . hydrALAZINE  25 mg Oral Q8H  . lidocaine  1 patch Transdermal Q24H  . multivitamin  1 tablet Oral QHS  . sevelamer carbonate  1,600 mg Oral TID WC     Dialysis Orders:  From previous unit in [place] 4.5hrs - TTS 2K 2.5Ca, 400/AF 1.5 EDW 47kg Access: R IJ TDC, LUE AVG placed 3/6 Hep 2100 unit bolus + 2000 unit mid run Calcitriol 0.25 mcg qHD  Assessment/Plan: 1. Acute respiratory failure/volume overload 2/2 to missed HD. Resolved with HD. Probing for EDW 2. ESRD - HD TTS. Relocated to [facility] without OP center.Has outpatient [facility] TTS 2nd shift.Discharge delayed D/T homeless status.New LUE AVG placed 3/6 per Dr. Carlis Abbott. Gabapentin 300 started per VVS for L hand pain/numbness.Concerns forpossible steal and appearedto possibly be cellulitic improving withPO Keflex VVS following. 3. HTN/volume- BP controlled. On amlodipine 5mg  carvedilol 25 bid, hydralazine 25 bid. Appears euvolemic on exam. HD 03/14 pre wt 46.7 kg Net UF 1.5 Post wt 45.2 kg. Lower EDW on DC. BP soft. Decrease amlodipine to 2.5 mg PO q HS.  4. Anemia- Hgb9.2. Tsat 9%-continue Fe load.   5.Metabolic Bone Disease- Ca ok. Phosuncontrolled but coming down. Increase Renvela 1>2 tabs TID. PTHat goal. 6. Nutrition - Prot supp for low albumin. Renal diet/vitamins 7. PVD. s/p aortobifemoral bypass w/redo. On apixaban.  8. Disposition-MSW consulted D/T homeless status.Still no definite place to live and no transportation to HD unit. ] Jimmye Norman. Lorita Forinash NP-C 01/04/2019, 11:02 AM  [employer] [phone]

## 2019-01-05 DIAGNOSIS — G629 Polyneuropathy, unspecified: Secondary | ICD-10-CM

## 2019-01-05 MED ORDER — POLYETHYLENE GLYCOL 3350 17 G PO PACK
17.0000 g | PACK | Freq: Every day | ORAL | Status: DC
  Administered 2019-01-05 – 2019-01-06 (×2): 17 g via ORAL
  Filled 2019-01-05 (×3): qty 1

## 2019-01-05 MED FILL — ELIQUIS 2.5 MG TABLET: 2.5 | 30 days supply | Qty: 60 | Fill #0

## 2019-01-05 NOTE — Progress Notes (Signed)
Brief update:  Spoke with Brett Small this afternoon. He is working hard on trying to find a place to go after this hospitalization. The homeless shelters suggested by social work are all full or are not accepting new resident applications this month. There is one boarding house in [place] that he could afford. He is awaiting a call back for a background check. He has reached out to [facility] to discuss transportation options from [place] to [place] for dialysis. We may need to look for dialysis options in [facility] so he can receive affordable transportation to appointments.   Brett Small endorsed a sense of hopelessness and passive suicidal ideation. He has no plan or intent, but feels that his health is failing him, he has no possessions or home, and his family and friends are leaving him behind. He sees suicide as sinful, which is his main motivation for avoiding it. After further discussion, he acknowledges some hope found in his clinical improvement over the past few months. He has less pain in his legs and is able to walk. He can breathe well with dialysis and has clearer thoughts. He can find value in the way he treats the staff who care for him. I have no concerns for risk of self-harm at this time.

## 2019-01-05 NOTE — Progress Notes (Addendum)
[facility] Progress Note   Subjective: No new complaints Waiting for call back from boarding house in [place]:   01/04/19 1337 01/04/19 2054 01/05/19 0456 01/05/19 0456  BP: 133/70 116/66  100/61  Pulse: 67 82  68  Resp:  15 17 12   Temp:  98 F (36.7 C)  98.1 F (36.7 C)  TempSrc:  Oral  Oral  SpO2: 98% 100%  98%  Weight:    45.8 kg  Height:       Physical Exam General:WN,WD male in NAD Heart:S1,S2, RRR Lungs:CTAB A/P Abdomen:S, NT Extremities:No LE edema Dialysis Access:RIJ TDC . LUA AVG swelling improving, no erythema    Additional Objective Labs: Basic Metabolic Panel: Recent Labs  Lab 12/30/18 0803 01/03/19 0850  NA 137 133*  K 4.2 3.8  CL 99 94*  CO2 21* 23  GLUCOSE 105* 84  BUN 57* 43*  CREATININE 10.52* 9.36*  CALCIUM 8.3* 9.0  PHOS 10.2* 8.4*   Liver Function Tests: Recent Labs  Lab 12/30/18 0803 01/03/19 0850  ALBUMIN 2.7* 3.3*   No results for input(s): LIPASE, AMYLASE in the last 168 hours. CBC: Recent Labs  Lab 12/30/18 0803 01/03/19 0337  WBC 9.1 6.4  HGB 8.9* 9.2*  HCT 28.5* 28.5*  MCV 90.5 87.2  PLT 178 193   Blood Culture No results found for: SDES, SPECREQUEST, CULT, REPTSTATUS  Cardiac Enzymes: No results for input(s): CKTOTAL, CKMB, CKMBINDEX, TROPONINI in the last 168 hours. CBG: No results for input(s): GLUCAP in the last 168 hours. Iron Studies: No results for input(s): IRON, TIBC, TRANSFERRIN, FERRITIN in the last 72 hours. @lablastinr3 @ Studies/Results: No results found. Medications: . sodium chloride    . sodium chloride    . sodium chloride 10 mL/hr at 01/02/19 1228  . ferric gluconate (FERRLECIT/NULECIT) IV 125 mg (01/03/19 1104)   . amLODipine  2.5 mg Oral QHS  . apixaban  2.5 mg Oral BID  . aspirin EC  81 mg Oral Daily  . atorvastatin  80 mg Oral QHS  . calcitRIOL  0.25 mcg Oral Q T,Th,Sa-HD  . carvedilol  25 mg Oral BID WC  . Chlorhexidine Gluconate  Cloth  6 each Topical Q0600  . feeding supplement (PRO-STAT SUGAR FREE 64)  30 mL Oral BID  . gabapentin  300 mg Oral QHS  . hydrALAZINE  25 mg Oral Q8H  . lidocaine  1 patch Transdermal Q24H  . multivitamin  1 tablet Oral QHS  . sevelamer carbonate  1,600 mg Oral TID WC      From previous dialysis unit in >> TTS [place] 4.5h  2/2.5 bath  400/1.5  47kg   R IJ TDC/ LUE AVG 3/6  Hep 2100+ 2000 midrun Calcitriol 0.25 mcg qHD  Assessment/Plan: 1. Acute respiratory failure/volume overload 2/2 to missed HD. Resolved with HD. Probing for EDW 2. ESRD - HD TTS. Relocated to [facility] without OP center.Has outpatient [facility] TTS 2nd shift.Discharge delayed D/T homeless status.New LUE AVG placed 3/6 per Dr. Carlis Abbott. Gabapentin 300 started per VVS for L hand pain/numbness.Concerns forpossible steal and appearedto possibly be cellulitic improving withPO Keflex VVS following. 3. HTN/volume- BP controlled. On amlodipine 5mg  carvedilol 25 bid, hydralazine 25 bid. Appears euvolemic on exam. Post wt 45.2 kg. Lower EDW on DC. BP soft. Decrease amlodipine to 2.5 mg PO q HS.  4. Anemia- Hgb9.2. Tsat 9%-continue Fe load.   5.Metabolic Bone Disease- Ca ok. Phosuncontrolled but coming down. Increase Renvela 1>2 tabs TID. PTHat goal.  6. Nutrition - Prot supp for low albumin. Renal diet/vitamins 7. PVD. s/p aortobifemoral bypass w/redo. On apixaban.  8. Disposition-MSW consulted D/T homeless status.Still no definite place to live and no transportation to HD unit.  Lynnda Child PA-C [facility] Pager [phone] 01/05/2019,11:22 AM  Pt seen, examined and agree w A/P as above.  [facility] 01/05/2019, 12:44 PM

## 2019-01-05 NOTE — Plan of Care (Signed)
  Problem: Education: Goal: Knowledge of General Education information will improve Description Including pain rating scale, medication(s)/side effects and non-pharmacologic comfort measures Outcome: Progressing   Problem: Education: Goal: Knowledge of General Education information will improve Description Including pain rating scale, medication(s)/side effects and non-pharmacologic comfort measures Outcome: Progressing   Problem: Health Behavior/Discharge Planning: Goal: Ability to manage health-related needs will improve Outcome: Progressing   Problem: Clinical Measurements: Goal: Ability to maintain clinical measurements within normal limits will improve Outcome: Progressing Goal: Diagnostic test results will improve Outcome: Progressing   Problem: Nutrition: Goal: Adequate nutrition will be maintained Outcome: Progressing   Problem: Coping: Goal: Level of anxiety will decrease Outcome: Completed/Met

## 2019-01-05 NOTE — Progress Notes (Addendum)
   Subjective: No events overnight. Brett Small has no new symptoms this morning. He continues to have some numbness in his left hand, but the swelling and pain in his arm has dramatically improved. He has not been in touch with his housing contact in [place] recently, so there is still some uncertainty about where he might go after leaving the hospital, or how he would get to dialysis in [place]  Objective:  Vital signs in last 24 hours: Vitals:   01/04/19 1337 01/04/19 2054 01/05/19 0456 01/05/19 0456  BP: 133/70 116/66  100/61  Pulse: 67 82  68  Resp:  15 17 12   Temp:  98 F (36.7 C)  98.1 F (36.7 C)  TempSrc:  Oral  Oral  SpO2: 98% 100%  98%  Weight:    45.8 kg  Height:       General: patient is asleep but awakens easily on exam. Appears comfortable.  Cardiovascular: RRR, no murmurs, rubs, or gallops.  Pulmonary/Chest: CTAB, no wheezes, rales, or rhonchi.  Extremities: Warm and well perfused. LUE AVG has palpable thrill. Erythema, swelling and tenderness are markedly improved.   Psychiatric: Normal mood and affect  Assessment/Plan:  Principal Problem:   End stage renal disease (HCC) Active Problems:   Acute respiratory failure with hypoxia (HCC)   Hypertension   Renal osteodystrophy   Homelessness  ESRD on HD T/Th/Sat: Relocated to [place] without outpatient dialysis center. He has been set up [facility], T/Th/Sa shift. Unfortunately, he is unable to be discharged due to homelessness and no transportation to dialysis.  -- Nephrology consulted for dialysis needs; appreciate assistance  -- social work has offered information about housing options in [place] and has signed off. Pt has not been successful yet in reaching out regarding housing options. Discussed importance of finding a safe place to stay with Brett Small. Pt seems to be passive in approach to finding a new home. Cannot discharge without safe plan for patient to be able to receive  outpatient dialysis.   POD 7 Left Upper Extremity AVG: Complicated by mediate nerve damage during surgery now with neuropathy and "gore-tex" reaction. Gore-tex reaction is improving.  -- Continue gabapentin 300 mg qhs for peripheral neuropathy  PVD Thrombosis of aortobifemoral bypass requiring embolectomy and redo 10/2018 -- Continue ASA 81 daily -- Continue eliquis 2.5 mg BID  FEN: fluid restrict, lytes per dialysis, renal diet VTE ppx: Eliquis  Code Status: FULL   Dispo: Anticipated discharge pending safe plan for discharge and ability to attend outpatient dialysis.   LOS: 11 days   Noralyn Pick, Medical Student 01/05/2019, 9:35 AM Pager: [phone]  Attestation for Student Documentation:  I personally was present and performed or re-performed the history, physical exam and medical decision-making activities of this service and have verified that the service and findings are accurately documented in the student's note.  Velna Ochs, MD 01/05/2019, 12:21 PM

## 2019-01-06 DIAGNOSIS — F322 Major depressive disorder, single episode, severe without psychotic features: Secondary | ICD-10-CM

## 2019-01-06 DIAGNOSIS — R45851 Suicidal ideations: Secondary | ICD-10-CM

## 2019-01-06 DIAGNOSIS — F329 Major depressive disorder, single episode, unspecified: Secondary | ICD-10-CM

## 2019-01-06 LAB — CBC
HCT: 33.4 % — ABNORMAL LOW (ref 39.0–52.0)
Hemoglobin: 10.8 g/dL — ABNORMAL LOW (ref 13.0–17.0)
MCH: 29.3 pg (ref 26.0–34.0)
MCHC: 32.3 g/dL (ref 30.0–36.0)
MCV: 90.8 fL (ref 80.0–100.0)
Platelets: 228 10*3/uL (ref 150–400)
RBC: 3.68 MIL/uL — ABNORMAL LOW (ref 4.22–5.81)
RDW: 14.4 % (ref 11.5–15.5)
WBC: 10.1 10*3/uL (ref 4.0–10.5)
nRBC: 0 % (ref 0.0–0.2)

## 2019-01-06 LAB — RENAL FUNCTION PANEL
Albumin: 3.4 g/dL — ABNORMAL LOW (ref 3.5–5.0)
Anion gap: 15 (ref 5–15)
BUN: 58 mg/dL — ABNORMAL HIGH (ref 6–20)
CO2: 23 mmol/L (ref 22–32)
Calcium: 8.6 mg/dL — ABNORMAL LOW (ref 8.9–10.3)
Chloride: 97 mmol/L — ABNORMAL LOW (ref 98–111)
Creatinine, Ser: 12.11 mg/dL — ABNORMAL HIGH (ref 0.61–1.24)
GFR calc Af Amer: 5 mL/min — ABNORMAL LOW (ref >60)
GFR calc non Af Amer: 4 mL/min — ABNORMAL LOW (ref >60)
GLUCOSE: 110 mg/dL — AB (ref 70–99)
Phosphorus: 8.7 mg/dL — ABNORMAL HIGH (ref 2.5–4.6)
Potassium: 3.9 mmol/L (ref 3.5–5.1)
Sodium: 135 mmol/L (ref 135–145)

## 2019-01-06 MED ORDER — CALCITRIOL 0.25 MCG PO CAPS
ORAL_CAPSULE | ORAL | Status: AC
  Administered 2019-01-06: 0.25 ug via ORAL
  Filled 2019-01-06: qty 1

## 2019-01-06 MED ORDER — LIDOCAINE HCL (PF) 1 % IJ SOLN: 5.0000 mL | INTRAMUSCULAR | Status: DC | PRN

## 2019-01-06 MED ORDER — SODIUM CHLORIDE 0.9 % IV SOLN: 100.0000 mL | INTRAVENOUS | Status: DC | PRN

## 2019-01-06 MED ORDER — HEPARIN SODIUM (PORCINE) 1000 UNIT/ML DIALYSIS: 20.0000 [IU]/kg | INTRAMUSCULAR | Status: DC | PRN

## 2019-01-06 MED ORDER — HEPARIN SODIUM (PORCINE) 1000 UNIT/ML IJ SOLN
INTRAMUSCULAR | Status: AC
  Administered 2019-01-06: 3000 [IU] via INTRAVENOUS_CENTRAL
  Filled 2019-01-06: qty 3

## 2019-01-06 MED ORDER — HEPARIN SODIUM (PORCINE) 1000 UNIT/ML DIALYSIS
1000.0000 [IU] | INTRAMUSCULAR | Status: DC | PRN
  Administered 2019-01-06: 3000 [IU] via INTRAVENOUS_CENTRAL

## 2019-01-06 NOTE — Social Work (Addendum)
4:22 pm Noted psychiatry recommendation for inpatient psych placement. Also received call from MD [facility], stating the same. Made referral to [facility], as patient is on dialysis. Will follow up for bed availability after referral has been reviewed. CSW to follow.  3:12 pm CSW left message for social worker at patient's outpatient dialysis center to inquire about additional resources they may have for patient's who are homeless. Awaiting return call.  2:34 pm CSW received call from RN that patient's sister requesting to speak to CSW. CSW discussed with patient, and patient verbally consented for CSW to speak with his sister, Cinda Quest. Patient reported he has been unable to find a shelter bed. He has not seen his cousins that came with him from [place] in several days. CSW also called local shelters and there are not beds available.  CSW spoke to patient's sister on the phone. She reported that patient used to live with their parents in [place], and their father was taking patient to dialysis, providing support, etc., and then patient decided to go to [place]. Sister does not know why patient went to [place] or why when he returned to [facility], he came to [place]. She stated that patient cannot stay with her; she has young children and reports a long history of patient asking for help from her. She indicated she had tried calling shelters as well but was unable to find one with beds available. She had questions about what resources had already been provided to patient. CSW updated sister on CSW efforts.  Estanislado Emms, LCSW [phone]

## 2019-01-06 NOTE — Consult Note (Addendum)
[facility] Face-to-Face Psychiatry Consult   Reason for Consult:  Depression, anxiety and SI.  Referring Physician:  Dr. Lenice Pressman  Patient Identification: Brett Small MRN:  [id number] Principal Diagnosis: MDD (major depressive disorder), single episode, severe , no psychosis ([place]) Diagnosis:  Principal Problem:   End stage renal disease ([place]) Active Problems:   Acute respiratory failure with hypoxia ([place])   Hypertension   Renal osteodystrophy   Homelessness   Total Time spent with patient: 1 hour  Subjective:   Brett Small is a 43 y.o. male patient admitted with acute respiratory failure/volume overload.  HPI:   Per chart review, patient was admitted with acute respiratory/volume overload secondary to missed hemodialysis. He feels hopeless in the setting of homelessness and ongoing medical problems. He denies any intent to harm self and reports that it would be "sinful." He previously endorsed passive SI but denied yesterday per chart review due to feeling more hopeful about his future after receiving treatment for his medical conditions.   On interview, Brett Small reports onset of depression in September after starting dialysis. He reports difficultly with adjusting to a new lifestyle. He endorses SI with thoughts to overdose. He is unable to safety plan. He denies HI or AVH. He denies problems with sleep or appetite. He reports feelings of loneliness. He does "not have anyone to turn to." He has been homeless for 2 weeks. He was previously living with a cousin but his cousin lost his housing. He reports that the last time he had stable housing was in January when he was living with his father. He moved from [place] to live with his father after his health declined. He denies a prior psychiatric history. He did see a psychiatrist once after he was charged with vandalism as a juvenile. He denies access to guns or weapons or current legal charges. He denies a history of manic symptoms  (decreased need for sleep, increased energy, pressured speech or euphoria).   Past Psychiatric History: Denies   Risk to Self:  Yes. Endorses SI with thoughts to overdose on his medications.  Risk to Others:  None. Denies HI.  Prior Inpatient Therapy:  Denies  Prior Outpatient Therapy:  Denies   Past Medical History:  Past Medical History:  Diagnosis Date  . Hypertension   . Renal disorder     Past Surgical History:  Procedure Laterality Date  . AORTA - FEMORAL ARTERY BYPASS GRAFT    . AV FISTULA PLACEMENT Left 12/26/2018   Procedure: INSERTION OF GORE STRETCH VASCULAR 4-7MM X  45CM IN LEFT UPPER ARM;  Surgeon: Marty Heck, MD;  Location: [facility] OR;  Service: Vascular;  Laterality: Left;   Family History:  Family History  Problem Relation Age of Onset  . Hypertension Other   . Diabetes Other   . Clotting disorder Father    Family Psychiatric  History: Denies  Social History:  Social History   Substance and Sexual Activity  Alcohol Use Not Currently     Social History   Substance and Sexual Activity  Drug Use Never    Social History   Socioeconomic History  . Marital status: Single    Spouse name: Not on file  . Number of children: Not on file  . Years of education: Not on file  . Highest education level: Not on file  Occupational History  . Not on file  Social Needs  . Financial resource strain: Not on file  . Food insecurity:    Worry: Not  on file    Inability: Not on file  . Transportation needs:    Medical: Not on file    Non-medical: Not on file  Tobacco Use  . Smoking status: Former Smoker    Last attempt to quit: 06/26/2018    Years since quitting: 0.5  . Smokeless tobacco: Never Used  . Tobacco comment: smoked for 30 years   Substance and Sexual Activity  . Alcohol use: Not Currently  . Drug use: Never  . Sexual activity: Not on file  Lifestyle  . Physical activity:    Days per week: Not on file    Minutes per session: Not on file  .  Stress: Not on file  Relationships  . Social connections:    Talks on phone: Not on file    Gets together: Not on file    Attends religious service: Not on file    Active member of club or organization: Not on file    Attends meetings of clubs or organizations: Not on file    Relationship status: Not on file  Other Topics Concern  . Not on file  Social History Narrative  . Not on file   Additional Social History: He is homeless. He was previously living with a cousin but his cousin lost his housing and is now living in his car. He was staying with his father prior to living with his cousin. He moved from [place] to live with his father after his health declined. He reports a history of vandalism as a juvenile but denies current legal charges. He denies alcohol or illicit substance use.     Allergies:   Allergies  Allergen Reactions  . Codeine Nausea Only    Patient questioned this (??)  . Sulfa Antibiotics Hives and Nausea And Vomiting    Labs:  Results for orders placed or performed during the hospital encounter of 12/24/18 (from the past 48 hour(s))  CBC     Status: Abnormal   Collection Time: 01/06/19  7:01 AM  Result Value Ref Range   WBC 10.1 4.0 - 10.5 K/uL   RBC 3.68 (L) 4.22 - 5.81 MIL/uL   Hemoglobin 10.8 (L) 13.0 - 17.0 g/dL   HCT 33.4 (L) 39.0 - 52.0 %   MCV 90.8 80.0 - 100.0 fL   MCH 29.3 26.0 - 34.0 pg   MCHC 32.3 30.0 - 36.0 g/dL   RDW 14.4 11.5 - 15.5 %   Platelets 228 150 - 400 K/uL   nRBC 0.0 0.0 - 0.2 %    Comment: Performed at [facility], [place] [address] [place], [place] [address]  Renal function panel     Status: Abnormal   Collection Time: 01/06/19  7:01 AM  Result Value Ref Range   Sodium 135 135 - 145 mmol/L   Potassium 3.9 3.5 - 5.1 mmol/L   Chloride 97 (L) 98 - 111 mmol/L   CO2 23 22 - 32 mmol/L   Glucose, Bld 110 (H) 70 - 99 mg/dL   BUN 58 (H) 6 - 20 mg/dL   Creatinine, Ser 12.11 (H) 0.61 - 1.24 mg/dL   Calcium 8.6 (L) 8.9 - 10.3  mg/dL   Phosphorus 8.7 (H) 2.5 - 4.6 mg/dL   Albumin 3.4 (L) 3.5 - 5.0 g/dL   GFR calc non Af Amer 4 (L) >60 mL/min   GFR calc Af Amer 5 (L) >60 mL/min   Anion gap 15 5 - 15    Comment: Performed at [facility], [place] [address], [place], [place] [address]    Current Facility-Administered Medications  Medication Dose Route Frequency Provider Last Rate Last Dose  . 0.9 %  sodium chloride infusion  100 mL Intravenous PRN Rhyne, Samantha J, PA-C      . 0.9 %  sodium chloride infusion  100 mL Intravenous PRN Rhyne, Samantha J, PA-C      . 0.9 %  sodium chloride infusion   Intravenous Continuous Rhyne, Hulen Shouts, PA-C 10 mL/hr at 01/02/19 1228    . acetaminophen (TYLENOL) tablet 650 mg  650 mg Oral Q6H PRN Gabriel Earing, PA-C   650 mg at 01/02/19 0141  . alteplase (CATHFLO ACTIVASE) injection 2 mg  2 mg Intracatheter Once PRN Rhyne, Samantha J, PA-C      . amLODipine (NORVASC) tablet 2.5 mg  2.5 mg Oral QHS Axel Filler, MD   2.5 mg at 01/05/19 2332  . apixaban (ELIQUIS) tablet 2.5 mg  2.5 mg Oral BID Velna Ochs, MD   2.5 mg at 01/05/19 2331  . aspirin EC tablet 81 mg  81 mg Oral Daily Gabriel Earing, PA-C   81 mg at 01/05/19 0847  . atorvastatin (LIPITOR) tablet 80 mg  80 mg Oral QHS Rhyne, Hulen Shouts, PA-C   80 mg at 01/05/19 2331  . calcitRIOL (ROCALTROL) 0.25 MCG capsule           . calcitRIOL (ROCALTROL) capsule 0.25 mcg  0.25 mcg Oral Q T,Th,Sa-HD Penninger, Lindsay, PA   0.25 mcg at 01/03/19 1103  . carvedilol (COREG) tablet 25 mg  25 mg Oral BID WC Rhyne, Samantha J, PA-C   25 mg at 01/05/19 1644  . Chlorhexidine Gluconate Cloth 2 % PADS 6 each  6 each Topical Q0600 Roney Jaffe, MD   6 each at 01/06/19 0600  . feeding supplement (PRO-STAT SUGAR FREE 64) liquid 30 mL  30 mL Oral BID Penninger, Lindsay, PA   30 mL at 01/05/19 0848  . ferric gluconate (NULECIT) 125 mg in sodium chloride 0.9 % 100 mL IVPB  125 mg Intravenous Q T,Th,Sa-HD Penninger, Lindsay,  PA 110 mL/hr at 01/03/19 1104 125 mg at 01/03/19 1104  . gabapentin (NEURONTIN) capsule 300 mg  300 mg Oral QHS Penninger, Lindsay, PA   300 mg at 01/05/19 2331  . heparin 1000 UNIT/ML injection           . [START ON 01/07/2019] heparin injection 1,000 Units  1,000 Units Dialysis PRN Lynnda Child, PA-C      . [START ON 01/07/2019] heparin injection 900 Units  20 Units/kg Dialysis PRN Lynnda Child, PA-C      . hydrALAZINE (APRESOLINE) tablet 25 mg  25 mg Oral Q8H Rhyne, Samantha J, PA-C   25 mg at 01/05/19 2330  . hydrOXYzine (ATARAX/VISTARIL) tablet 25 mg  25 mg Oral TID PRN Rhyne, Samantha J, PA-C      . lidocaine (LIDODERM) 5 % 1 patch  1 patch Transdermal Q24H Rhyne, Hulen Shouts, PA-C   Stopped at 12/25/18 3154  . lidocaine (PF) (XYLOCAINE) 1 % injection 5 mL  5 mL Intradermal PRN Rhyne, Samantha J, PA-C      . lidocaine-prilocaine (EMLA) cream 1 application  1 application Topical PRN Rhyne, Samantha J, PA-C      . multivitamin (RENA-VIT) tablet 1 tablet  1 tablet Oral QHS Rhyne, Samantha J, PA-C   1 tablet at 01/05/19 2332  . ondansetron (ZOFRAN) tablet 4 mg  4  mg Oral Q6H PRN Gabriel Earing, PA-C   4 mg at 12/30/18 1321   Or  . ondansetron (ZOFRAN) injection 4 mg  4 mg Intravenous Q6H PRN Rhyne, Samantha J, PA-C      . oxyCODONE (Oxy IR/ROXICODONE) immediate release tablet 5 mg  5 mg Oral Q6H PRN Rhyne, Samantha J, PA-C   5 mg at 01/05/19 2335  . pentafluoroprop-tetrafluoroeth (GEBAUERS) aerosol 1 application  1 application Topical PRN Rhyne, Samantha J, PA-C      . polyethylene glycol (MIRALAX / GLYCOLAX) packet 17 g  17 g Oral Daily Carroll Sage, MD   17 g at 01/05/19 2329  . sevelamer carbonate (RENVELA) tablet 1,600 mg  1,600 mg Oral TID WC Lynnda Child, PA-C   1,600 mg at 01/05/19 1644  . sodium chloride (OCEAN) 0.65 % nasal spray 1 spray  1 spray Each Nare PRN Rehman, Areeg N, DO   1 spray at 12/29/18 0831    Musculoskeletal: Strength & Muscle Tone:  within normal limits Gait & Station: UTA since paitent is lying in bed. Patient leans: N/A  Psychiatric Specialty Exam: Physical Exam  Nursing note and vitals reviewed. Constitutional: He is oriented to person, place, and time. He appears well-developed and well-nourished.  HENT:  Head: Normocephalic and atraumatic.  Neck: Normal range of motion.  Respiratory: Effort normal.  Musculoskeletal: Normal range of motion.  Neurological: He is alert and oriented to person, place, and time.  Psychiatric: His speech is normal and behavior is normal. Judgment normal. Cognition and memory are normal. He exhibits a depressed mood. He expresses suicidal ideation. He expresses suicidal plans.    Review of Systems  Cardiovascular: Negative for chest pain.  Gastrointestinal: Positive for constipation. Negative for abdominal pain, diarrhea, nausea and vomiting.  Psychiatric/Behavioral: Positive for depression and suicidal ideas. Negative for hallucinations and substance abuse. The patient does not have insomnia.   All other systems reviewed and are negative.   Blood pressure (!) 94/53, pulse 75, temperature 98.3 F (36.8 C), temperature source Oral, resp. rate 10, height 5\' 1"  (1.549 m), weight 46.6 kg, SpO2 98 %.Body mass index is 19.41 kg/m.  General Appearance: Fairly Groomed, middle aged, Caucasian male, wearing a hospital gown with a full beard and corrective lenses who is lying in bed. NAD.   Eye Contact:  Good  Speech:  Clear and Coherent and Normal Rate  Volume:  Normal  Mood:  Depressed  Affect:  Constricted  Thought Process:  Goal Directed, Linear and Descriptions of Associations: Intact   Orientation:  Full (Time, Place, and Person)  Thought Content:  Logical  Suicidal Thoughts:  Yes.  with intent/plan  Homicidal Thoughts:  No  Memory:  Immediate;   Good Recent;   Good Remote;   Good  Judgement:  Fair  Insight:  Fair  Psychomotor Activity:  Normal  Concentration:  Concentration:  Good and Attention Span: Good  Recall:  Good  Fund of Knowledge:  Good  Language:  Good  Akathisia:  No  Handed:  Right  AIMS (if indicated):   N/A  Assets:  Communication Skills Desire for Improvement Resilience  ADL's:  Intact  Cognition:  WNL  Sleep:   Okay   Assessment:  Brett Small is a 43 y.o. male who was admitted with acute respiratory failure/volume overload secondary to missed hemodialysis. He endorses depressed mood since September after starting dialysis. He endorses SI with thoughts to overdose on his medications. He is unable to safety plan. He  warrants inpatient psychiatric hospitalization at this time. He is agreeable to starting an antidepressant for his mood.   Treatment Plan Summary: -Patient warrants inpatient psychiatric hospitalization given high risk of harm to self. -Continue bedside sitter.  -Start Lexapro 10 mg daily for mood.  -EKG reviewed and QTc 496 on 3/4. Please closely monitor when starting or increasing QTc prolonging agents.  -Please pursue involuntary commitment if patient refuses voluntary psychiatric hospitalization or attempts to leave the hospital.  -Will sign off on patient at this time. Please consult psychiatry again as needed.    Disposition: Recommend psychiatric Inpatient admission when medically cleared.  Faythe Dingwall, DO 01/06/2019 10:52 AM

## 2019-01-06 NOTE — Progress Notes (Signed)
   Subjective: Seen in dialysis this morning. He is tearful and hopeless regarding his current situation. He endorses depression and suicidal ideation, no longer wishes to live. He has been calling homeless shelters in [name] with no luck. He was being considered for a boarding house in [place], but was passed over for another applicant because he was "too sick". He has been unable to get in touch with his family and feels that they have left him behind and don't care about him.     Objective:  Vital signs in last 24 hours: Vitals:   01/06/19 0930 01/06/19 1000 01/06/19 1030 01/06/19 1052  BP: (!) 96/56 95/65 (!) 94/53 128/77  Pulse: 74 75 75 78  Resp: 19 10 10 15   Temp:    98 F (36.7 C)  TempSrc:    Oral  SpO2:      Weight:    45.5 kg  Height:       Physical Exam Constitutional: NAD on dialysis, appears comfortable Cardiovascular: RRR, no murmurs, rubs, or gallops.  Pulmonary/Chest: CTAB, no wheezes, rales, or rhonchi.  Extremities: Warm and well perfused. Distal pulses intact. No edema.  Neurological: A&Ox3, CN II - XII grossly intact.  Skin: No rashes or erythema  Psychiatric: Normal mood and affect   Assessment/Plan:  Principal Problem:   End stage renal disease (HCC) Active Problems:   Acute respiratory failure with hypoxia (HCC)   Hypertension   Renal osteodystrophy   Homelessness  Depression: Although this seems situational, he may benefit from psychiatry consult and treatment. He feels hopeless regarding his current medical condition and homelessness. He feels abandoned by his family and that no one cares about him. He endorses a generalized death wish that he no longer wishes to be alive. Says his plan would be to overdose on pills but does not plan to act on it. He has agreed to call us if these thoughts progress to where he might take action.  -- Psych consult, appreciate assistance   ESRD on HD T/Th/Sat: Relocated to [place] without outpatient dialysis  center. He has been set up [facility], T/Th/Sa shift. Unfortunately, he is unable to be discharged due to homelessness and no transportation to dialysis.  -- Nephrology consulted for dialysis needs; appreciate assistance   POD 11Left Upper Extremity PIR:[id number] by mediate nerve damage during surgery now with neuropathy and "gore-tex" reaction that has resolved.  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  Velna Ochs, MD 01/06/2019, 11:52 AM Pager: [phone]

## 2019-01-06 NOTE — Progress Notes (Signed)
  Date: 01/06/2019  Patient name: Brett Small  Medical record number: [id number]  Date of birth: 02/04/1976   I have seen and evaluated this patient and I have discussed the plan of care with the house staff. Please see their note for complete details. I concur with their findings with the following additions/corrections:   Seen and examined with the team at dialysis this morning.  He is much more down today, feeling quite hopeless about his overall situation and reporting that he sometimes feels like "just taking a whole bottle of pills and ending it".  He has no support system in this area and has not heard from the cousins that moved with him to this area.  He reports he is being denied a bed at the boardinghouse in [place].  All the shelters in the [place] area are reporting no openings.  Based on his mood changes, suicidal ideation, and lack of support, we have consulted psychiatry and appreciate the recommendation of inpatient psychiatric admission for him.  They have also recommended we start escitalopram.  Hopefully we will be able to find an inpatient psychiatry bed for him.  Lenice Pressman, M.D., Ph.D. 01/06/2019, 4:32 PM

## 2019-01-06 NOTE — Progress Notes (Addendum)
[facility] Progress Note   Subjective: Seen in HD unit. Tolerating HD via [place]. Frustrated about current disposition.   Objective Vitals:   01/06/19 0700 01/06/19 0730 01/06/19 0830 01/06/19 0900  BP: 108/66 102/62 107/60 (!) 101/59  Pulse: 62 63 73 73  Resp: (!) 9 (!) 9 (!) 21 14  Temp:      TempSrc:      SpO2:      Weight:      Height:       Physical Exam General:WN,WD male in NAD Heart:S1,S2, RRR Lungs:CTAB A/P Abdomen:S, NT Extremities:No LE edema Dialysis Access:RIJ TDC . LUA AVG swelling improving, no erythema    Additional Objective Labs: Basic Metabolic Panel: Recent Labs  Lab 01/03/19 0850 01/06/19 0701  NA 133* 135  K 3.8 3.9  CL 94* 97*  CO2 23 23  GLUCOSE 84 110*  BUN 43* 58*  CREATININE 9.36* 12.11*  CALCIUM 9.0 8.6*  PHOS 8.4* 8.7*   Liver Function Tests: Recent Labs  Lab 01/03/19 0850 01/06/19 0701  ALBUMIN 3.3* 3.4*   No results for input(s): LIPASE, AMYLASE in the last 168 hours. CBC: Recent Labs  Lab 01/03/19 0337 01/06/19 0701  WBC 6.4 10.1  HGB 9.2* 10.8*  HCT 28.5* 33.4*  MCV 87.2 90.8  PLT 193 228   Blood Culture No results found for: SDES, SPECREQUEST, CULT, REPTSTATUS  Cardiac Enzymes: No results for input(s): CKTOTAL, CKMB, CKMBINDEX, TROPONINI in the last 168 hours. CBG: No results for input(s): GLUCAP in the last 168 hours. Iron Studies: No results for input(s): IRON, TIBC, TRANSFERRIN, FERRITIN in the last 72 hours. @lablastinr3 @ Studies/Results: No results found. Medications: . sodium chloride    . sodium chloride    . sodium chloride 10 mL/hr at 01/02/19 1228  . ferric gluconate (FERRLECIT/NULECIT) IV 125 mg (01/03/19 1104)   . amLODipine  2.5 mg Oral QHS  . apixaban  2.5 mg Oral BID  . aspirin EC  81 mg Oral Daily  . atorvastatin  80 mg Oral QHS  . calcitRIOL  0.25 mcg Oral Q T,Th,Sa-HD  . carvedilol  25 mg Oral BID WC  . Chlorhexidine Gluconate Cloth  6 each Topical Q0600  .  feeding supplement (PRO-STAT SUGAR FREE 64)  30 mL Oral BID  . gabapentin  300 mg Oral QHS  . hydrALAZINE  25 mg Oral Q8H  . lidocaine  1 patch Transdermal Q24H  . multivitamin  1 tablet Oral QHS  . polyethylene glycol  17 g Oral Daily  . sevelamer carbonate  1,600 mg Oral TID WC      From previous dialysis unit in >> TTS [place] 4.5h  2/2.5 bath  400/1.5  47kg   R IJ TDC/ LUE AVG 3/6  Hep 2100+ 2000 midrun Calcitriol 0.25 mcg qHD  Assessment/Plan: 1. Acute respiratory failure/volume overload 2/2 to missed HD. Resolved with HD. Probing for EDW 2. ESRD - HD TTS. Relocated to [facility] without OP center.Has outpatient [place] TTS 2nd shift but discharge has been delayed d/t homeless status.New LUE AVG placed 3/6 per Dr. Carlis Abbott. Gabapentin 300 started per [facility]VS for L hand pain/numbness. Completed a course of Keflex for cellulitic appearing graft site.   3. HTN/volume- BP controlled. On amlodipine 5mg  carvedilol 25 bid, hydralazine 25 bid. Appears euvolemic on exam. Post wt 45.2 kg. Lower EDW on DC. BP soft. Decrease amlodipine to 2.5 mg PO q HS.  4. Anemia- Hgb10.1  Tsat 9%-continue Fe load. 5.Metabolic Bone Disease- Ca ok. Phosuncontrolled. Increase  Renvela 1>2 tabs TID. PTHat goal. 6. Nutrition - Prot supp for low albumin. Renal diet/vitamins 7. PVD. s/p aortobifemoral bypass w/redo. On apixaban.  8. Disposition-MSW consulted D/T homeless status.Still no definite place to live and no transportation to HD unit.  Lynnda Child PA-C [facility] Pager [phone] 01/06/2019,9:44 AM   Pt seen, examined and agree w A/P as above.  [facility] 01/06/2019, 1:11 PM

## 2019-01-07 ENCOUNTER — Other Ambulatory Visit: Payer: Self-pay

## 2019-01-07 ENCOUNTER — Inpatient Hospital Stay
Admission: AD | Admit: 2019-01-07 | Discharge: 2019-01-27 | DRG: 252 | Disposition: A | Discharge status: Home / Self Care | Payer: Medicare Other | Source: Intra-hospital | Attending: Psychiatry | Admitting: Psychiatry

## 2019-01-07 DIAGNOSIS — D631 Anemia in chronic kidney disease: Secondary | ICD-10-CM | POA: Diagnosis present

## 2019-01-07 DIAGNOSIS — J9601 Acute respiratory failure with hypoxia: Secondary | ICD-10-CM | POA: Diagnosis not present

## 2019-01-07 DIAGNOSIS — G589 Mononeuropathy, unspecified: Secondary | ICD-10-CM

## 2019-01-07 DIAGNOSIS — N186 End stage renal disease: Secondary | ICD-10-CM | POA: Diagnosis present

## 2019-01-07 DIAGNOSIS — I340 Nonrheumatic mitral (valve) insufficiency: Secondary | ICD-10-CM | POA: Diagnosis not present

## 2019-01-07 DIAGNOSIS — Z882 Allergy status to sulfonamides status: Secondary | ICD-10-CM

## 2019-01-07 DIAGNOSIS — Y832 Surgical operation with anastomosis, bypass or graft as the cause of abnormal reaction of the patient, or of later complication, without mention of misadventure at the time of the procedure: Secondary | ICD-10-CM | POA: Diagnosis present

## 2019-01-07 DIAGNOSIS — I10 Essential (primary) hypertension: Secondary | ICD-10-CM | POA: Diagnosis present

## 2019-01-07 DIAGNOSIS — F341 Dysthymic disorder: Secondary | ICD-10-CM | POA: Diagnosis present

## 2019-01-07 DIAGNOSIS — F4321 Adjustment disorder with depressed mood: Secondary | ICD-10-CM | POA: Diagnosis present

## 2019-01-07 DIAGNOSIS — E1122 Type 2 diabetes mellitus with diabetic chronic kidney disease: Secondary | ICD-10-CM | POA: Diagnosis present

## 2019-01-07 DIAGNOSIS — I120 Hypertensive chronic kidney disease with stage 5 chronic kidney disease or end stage renal disease: Secondary | ICD-10-CM | POA: Diagnosis not present

## 2019-01-07 DIAGNOSIS — F32A Depression, unspecified: Secondary | ICD-10-CM | POA: Diagnosis present

## 2019-01-07 DIAGNOSIS — Z7982 Long term (current) use of aspirin: Secondary | ICD-10-CM | POA: Diagnosis not present

## 2019-01-07 DIAGNOSIS — M79642 Pain in left hand: Secondary | ICD-10-CM | POA: Diagnosis not present

## 2019-01-07 DIAGNOSIS — Z992 Dependence on renal dialysis: Secondary | ICD-10-CM

## 2019-01-07 DIAGNOSIS — I871 Compression of vein: Secondary | ICD-10-CM | POA: Diagnosis present

## 2019-01-07 DIAGNOSIS — Z885 Allergy status to narcotic agent status: Secondary | ICD-10-CM

## 2019-01-07 DIAGNOSIS — F321 Major depressive disorder, single episode, moderate: Secondary | ICD-10-CM | POA: Diagnosis not present

## 2019-01-07 DIAGNOSIS — Z5900 Homelessness unspecified: Secondary | ICD-10-CM

## 2019-01-07 DIAGNOSIS — N2581 Secondary hyperparathyroidism of renal origin: Secondary | ICD-10-CM | POA: Diagnosis present

## 2019-01-07 DIAGNOSIS — F329 Major depressive disorder, single episode, unspecified: Secondary | ICD-10-CM | POA: Diagnosis present

## 2019-01-07 DIAGNOSIS — Z87891 Personal history of nicotine dependence: Secondary | ICD-10-CM | POA: Diagnosis not present

## 2019-01-07 DIAGNOSIS — E8770 Fluid overload, unspecified: Secondary | ICD-10-CM | POA: Diagnosis not present

## 2019-01-07 DIAGNOSIS — Z79899 Other long term (current) drug therapy: Secondary | ICD-10-CM | POA: Diagnosis not present

## 2019-01-07 DIAGNOSIS — R200 Anesthesia of skin: Secondary | ICD-10-CM | POA: Diagnosis not present

## 2019-01-07 DIAGNOSIS — R0602 Shortness of breath: Secondary | ICD-10-CM | POA: Diagnosis not present

## 2019-01-07 DIAGNOSIS — Z7901 Long term (current) use of anticoagulants: Secondary | ICD-10-CM

## 2019-01-07 DIAGNOSIS — J810 Acute pulmonary edema: Secondary | ICD-10-CM | POA: Diagnosis not present

## 2019-01-07 DIAGNOSIS — R45851 Suicidal ideations: Secondary | ICD-10-CM | POA: Diagnosis present

## 2019-01-07 DIAGNOSIS — T82318A Breakdown (mechanical) of other vascular grafts, initial encounter: Secondary | ICD-10-CM | POA: Diagnosis present

## 2019-01-07 DIAGNOSIS — Z833 Family history of diabetes mellitus: Secondary | ICD-10-CM | POA: Diagnosis not present

## 2019-01-07 DIAGNOSIS — I361 Nonrheumatic tricuspid (valve) insufficiency: Secondary | ICD-10-CM | POA: Diagnosis not present

## 2019-01-07 DIAGNOSIS — T82858A Stenosis of vascular prosthetic devices, implants and grafts, initial encounter: Secondary | ICD-10-CM | POA: Diagnosis not present

## 2019-01-07 DIAGNOSIS — T827XXA Infection and inflammatory reaction due to other cardiac and vascular devices, implants and grafts, initial encounter: Secondary | ICD-10-CM

## 2019-01-07 MED ORDER — ASPIRIN EC 81 MG PO TBEC
81.0000 mg | DELAYED_RELEASE_TABLET | Freq: Every day | ORAL | Status: DC
  Administered 2019-01-09 – 2019-01-26 (×18): 81 mg via ORAL
  Filled 2019-01-07 (×18): qty 1

## 2019-01-07 MED ORDER — CARVEDILOL 12.5 MG PO TABS
25.0000 mg | ORAL_TABLET | Freq: Two times a day (BID) | ORAL | Status: DC
  Administered 2019-01-09 – 2019-01-26 (×32): 25 mg via ORAL
  Filled 2019-01-07 (×32): qty 2

## 2019-01-07 MED ORDER — RENA-VITE PO TABS
1.0000 | ORAL_TABLET | Freq: Every day | ORAL | Status: DC
  Administered 2019-01-09 – 2019-01-26 (×19): 1 via ORAL
  Filled 2019-01-07 (×21): qty 1

## 2019-01-07 MED ORDER — ESCITALOPRAM OXALATE 10 MG PO TABS
10.0000 mg | ORAL_TABLET | Freq: Every day | ORAL | Status: DC
  Administered 2019-01-07: 10 mg via ORAL
  Filled 2019-01-07: qty 1

## 2019-01-07 MED ORDER — AMLODIPINE BESYLATE 5 MG PO TABS
5.0000 mg | ORAL_TABLET | Freq: Every day | ORAL | Status: DC
  Administered 2019-01-10 – 2019-01-23 (×14): 5 mg via ORAL
  Filled 2019-01-07 (×16): qty 1

## 2019-01-07 MED ORDER — GABAPENTIN 300 MG PO CAPS
300.0000 mg | ORAL_CAPSULE | Freq: Three times a day (TID) | ORAL | Status: DC
  Administered 2019-01-08 – 2019-01-10 (×5): 300 mg via ORAL
  Filled 2019-01-07 (×4): qty 1

## 2019-01-07 MED ORDER — ACETAMINOPHEN 325 MG PO TABS
650.0000 mg | ORAL_TABLET | Freq: Four times a day (QID) | ORAL | Status: DC | PRN
  Administered 2019-01-10 – 2019-01-14 (×7): 650 mg via ORAL
  Filled 2019-01-07 (×6): qty 2

## 2019-01-07 MED ORDER — HYDROXYZINE HCL 25 MG PO TABS
25.0000 mg | ORAL_TABLET | Freq: Three times a day (TID) | ORAL | Status: DC | PRN
  Administered 2019-01-09 – 2019-01-26 (×23): 25 mg via ORAL
  Filled 2019-01-07 (×24): qty 1

## 2019-01-07 MED ORDER — CHLORHEXIDINE GLUCONATE CLOTH 2 % EX PADS: 6.0000 | MEDICATED_PAD | Freq: Every day | CUTANEOUS | Status: DC

## 2019-01-07 MED ORDER — APIXABAN 2.5 MG PO TABS
2.5000 mg | ORAL_TABLET | Freq: Two times a day (BID) | ORAL | Status: DC
  Administered 2019-01-07 – 2019-01-13 (×10): 2.5 mg via ORAL
  Filled 2019-01-07 (×13): qty 1

## 2019-01-07 MED ORDER — ESCITALOPRAM OXALATE 10 MG PO TABS: 10.0000 mg | ORAL_TABLET | Freq: Every day | ORAL | 1 refills | Status: DC

## 2019-01-07 MED ORDER — ESCITALOPRAM OXALATE 10 MG PO TABS
10.0000 mg | ORAL_TABLET | Freq: Every day | ORAL | Status: DC
  Administered 2019-01-08 – 2019-01-26 (×19): 10 mg via ORAL
  Filled 2019-01-07 (×19): qty 1

## 2019-01-07 MED ORDER — HYDRALAZINE HCL 25 MG PO TABS
25.0000 mg | ORAL_TABLET | Freq: Three times a day (TID) | ORAL | Status: DC
  Administered 2019-01-07 – 2019-01-26 (×50): 25 mg via ORAL
  Filled 2019-01-07 (×59): qty 1

## 2019-01-07 MED ORDER — SEVELAMER CARBONATE 800 MG PO TABS
1600.0000 mg | ORAL_TABLET | Freq: Three times a day (TID) | ORAL | Status: DC
  Administered 2019-01-08 – 2019-01-26 (×46): 1600 mg via ORAL
  Filled 2019-01-07 (×46): qty 2

## 2019-01-07 MED ORDER — ATORVASTATIN CALCIUM 20 MG PO TABS
80.0000 mg | ORAL_TABLET | Freq: Every day | ORAL | Status: DC
  Administered 2019-01-07 – 2019-01-26 (×20): 80 mg via ORAL
  Filled 2019-01-07 (×20): qty 4

## 2019-01-07 MED ORDER — APIXABAN 2.5 MG PO TABS: 2.5000 mg | ORAL_TABLET | Freq: Two times a day (BID) | ORAL | 0 refills | Status: DC

## 2019-01-07 MED ORDER — SEVELAMER CARBONATE 800 MG PO TABS: 1600.0000 mg | ORAL_TABLET | Freq: Three times a day (TID) | ORAL | 0 refills | Status: DC

## 2019-01-07 NOTE — Tx Team (Signed)
Initial Treatment Plan 01/07/2019 10:19 PM [name] OXN:[id number]    PATIENT STRESSORS: Health problems Medication change or noncompliance   PATIENT STRENGTHS: Ability for insight Communication skills   PATIENT IDENTIFIED PROBLEMS: Depression  Anxiety                   DISCHARGE CRITERIA:  Adequate post-discharge living arrangements Improved stabilization in mood, thinking, and/or behavior  PRELIMINARY DISCHARGE PLAN: Outpatient therapy Placement in alternative living arrangements  PATIENT/FAMILY INVOLVEMENT: This treatment plan has been presented to and reviewed with the patient, Brett Small. The patient has been given the opportunity to ask questions and make suggestions.  Mallie Darting, RN 01/07/2019, 10:19 PM

## 2019-01-07 NOTE — Care Management Important Message (Signed)
Important Message  Patient Details  Name: Brett Small MRN: [id number] Date of Birth: [birth year]   Medicare Important Message Given:  Yes    Raja Liska Montine Circle 01/07/2019, 11:47 AM

## 2019-01-07 NOTE — Progress Notes (Signed)
Brief Psychiatry Note:  Brett Small is a 43 yo male with history of end-stage renal disease on hemodialysis.  He was admitted to behavioral health unit for treatment of depression.  He currently denies suicidal ideation.  When putting in his admission orders, it alerted that the patient has a partial/limited code status.  I spoke with the patient via phone. RN [facility]) was present on the unit with the patient during our conversation.   The patient expressed the below code status.  Additionally, [name] also spoke with the patient directly and the patient expressed the below code status.  The patient affirmed and expressed his desires and voiced understanding of the following partial/limited code status. Patient's questions answered.  Patient is his own guardian.  He denies being married and states his father is his next of kin.   Question Answer Comment  In the event of cardiac or respiratory ARREST: Initiate Code Blue, Call Rapid Response No   In the event of cardiac or respiratory ARREST: Perform CPR No   In the event of cardiac or respiratory ARREST: Perform Intubation/Mechanical Ventilation No   In the event of cardiac or respiratory ARREST: Use NIPPV/BiPAp only if indicated Yes   In the event of cardiac or respiratory ARREST: Administer ACLS medications if indicated No   In the event of cardiac or respiratory ARREST: Perform Defibrillation or Cardioversion if indicated No

## 2019-01-07 NOTE — BH Assessment (Signed)
Patient has been accepted to [facility].  Accepting physician is Dr. Audie Box.  Attending Physician will be Dr. Weber Cooks.  Patient has been assigned to room 302, by [place].  Call report to [phone].  Representative/Transfer Coordinator is Dispensing optician Patient pre-admitted by [facility] Patient Access Arlana Hove)  [place] Staff Manuela Schwartz, Social Worker) made aware of acceptance.   Bed available anytime after 8:30pm

## 2019-01-07 NOTE — Progress Notes (Signed)
  Date: 01/07/2019  Patient name: Brett Small  Medical record number: [id number]  Date of birth: 1976/05/25   I have seen and evaluated this patient and I have discussed the plan of care with the house staff. Please see their note for complete details. I concur with their findings with the following additions/corrections:   Continues to do well medically with regular dialysis.  His left arm continues to improve with no current signs of infection.  He continues to have a very depressed and flat affect.  He is quite open about feeling down and hopeless.  He was evaluated by psychiatry yesterday, who recommended inpatient psychiatric admission for suicidal ideation with no realistic safety plan.  We are working on finding an inpatient psychiatric bed for him.  Most of his risk for suicidality is related to his complete lack of a safety net in this area, but he has been quite open about his symptoms and clear that he will communicate with [place] if he feels he is unsafe or at risk of acting on his suicidal ideation.  As such, it does not appear necessary at this time to remove all of his belongings from his room.  In the meantime, we have started him on escitalopram per psychiatry recommendations.  He is medically ready for discharge to inpatient psychiatry when a bed is available.  Lenice Pressman, M.D., Ph.D. 01/07/2019, 2:39 PM

## 2019-01-07 NOTE — Progress Notes (Signed)
[facility] Progress Note   Subjective: seen in room, no new c/o's.    Objective Vitals:   01/06/19 1751 01/06/19 2110 01/07/19 0507 01/07/19 0851  BP: 110/69 102/66 107/63 116/73  Pulse:  69 63 75  Resp:      Temp:  98.2 F (36.8 C) 97.9 F (36.6 C)   TempSrc:      SpO2:  98% 99%   Weight:      Height:       Physical Exam General:WN,WD male in NAD Heart:S1,S2, RRR Lungs:CTAB A/P Abdomen:S, NT Extremities:No LE edema Dialysis Access:RIJ TDC . LUA AVG swelling improving, no erythema    Additional Objective Labs: Basic Metabolic Panel: Recent Labs  Lab 01/03/19 0850 01/06/19 0701  NA 133* 135  K 3.8 3.9  CL 94* 97*  CO2 23 23  GLUCOSE 84 110*  BUN 43* 58*  CREATININE 9.36* 12.11*  CALCIUM 9.0 8.6*  PHOS 8.4* 8.7*   Liver Function Tests: Recent Labs  Lab 01/03/19 0850 01/06/19 0701  ALBUMIN 3.3* 3.4*   No results for input(s): LIPASE, AMYLASE in the last 168 hours. CBC: Recent Labs  Lab 01/03/19 0337 01/06/19 0701  WBC 6.4 10.1  HGB 9.2* 10.8*  HCT 28.5* 33.4*  MCV 87.2 90.8  PLT 193 228   Blood Culture No results found for: SDES, SPECREQUEST, CULT, REPTSTATUS  Cardiac Enzymes: No results for input(s): CKTOTAL, CKMB, CKMBINDEX, TROPONINI in the last 168 hours. CBG: No results for input(s): GLUCAP in the last 168 hours. Iron Studies: No results for input(s): IRON, TIBC, TRANSFERRIN, FERRITIN in the last 72 hours. @lablastinr3 @ Studies/Results: No results found. Medications: . sodium chloride    . sodium chloride    . sodium chloride 10 mL/hr at 01/02/19 1228  . ferric gluconate (FERRLECIT/NULECIT) IV Stopped (01/06/19 1205)   . amLODipine  2.5 mg Oral QHS  . apixaban  2.5 mg Oral BID  . aspirin EC  81 mg Oral Daily  . atorvastatin  80 mg Oral QHS  . calcitRIOL  0.25 mcg Oral Q T,Th,Sa-HD  . carvedilol  25 mg Oral BID WC  . Chlorhexidine Gluconate Cloth  6 each Topical Q0600  . escitalopram  10 mg Oral Daily  .  feeding supplement (PRO-STAT SUGAR FREE 64)  30 mL Oral BID  . gabapentin  300 mg Oral QHS  . hydrALAZINE  25 mg Oral Q8H  . lidocaine  1 patch Transdermal Q24H  . multivitamin  1 tablet Oral QHS  . polyethylene glycol  17 g Oral Daily  . sevelamer carbonate  1,600 mg Oral TID WC      From previous dialysis unit in >> TTS [place] 4.5h  2/2.5 bath  400/1.5  47kg   R IJ TDC/ LUE AVG 3/6  Hep 2100+ 2000 midrun Calcitriol 0.25 mcg qHD  Assessment/Plan: 1. Acute respiratory failure/volume overload-  2/2 to missed HD. Resolved with HD. Probing for EDW 2. ESRD on HD TTS -  Relocated to [facility] without OP center.Has outpatient [place] TTS 2nd shift but discharge has been delayed d/t homeless status.New LUE AVG placed 3/6 per Dr. Carlis Abbott. Gabapentin 300 started per [facility] for L hand pain/numbness. Completed a course of Keflex for cellulitic appearing graft site.   3. HTN/volume- BP low normal, will dc norvasc, cont carvedilol 25 bid, hydralazine 25 tid. No vol ^ on exam.  4. Anemia- Hgb10.1  Tsat 9%-continue Fe load. 5.Metabolic Bone Disease- Ca ok. Phosuncontrolled. Increase Renvela 1>2 tabs TID. PTHat goal. 6. Nutrition -  Prot supp for low albumin. Renal diet/vitamins 7. PVD. s/p aortobifemoral bypass w/redo. On apixaban.  8. Disposition- main issue. MSW consulted D/T homeless status.Still no definite place to live and no transportation to HD unit.   [facility] 01/07/2019, 10:33 AM

## 2019-01-07 NOTE — Progress Notes (Signed)
D - Patient was admitted from [facility] hospital at 2100. Report was not received from anyone prior to the patient being admitted to the unit. Patient was pleasant during assessment and medication administration. Patient presents with a sad/depressed affect. Patient denies SI/HI/AVH, pain and anxiety with this Probation officer. Patient rated his depression a 10/10, when assessed patient stated, "My depression is so high because I am in here. I have never been in a place like this before." Patient was oriented to the unit and to his room. Patient is a dialysis patient on Tuesday, Thursday and Saturdays.   A - Patient was compliant with medication administration per MD orders and procedures on the unit. Patient given education. Patient given support and encouragement to be active in his treatment plan. Patient informed to let staff know if there are any issues or problems on the unit.   R - Patient is being monitored Q 15 minutes for safety per unit protocol. Patient remains safe on the unit.

## 2019-01-07 NOTE — Progress Notes (Signed)
Spoke with Dr. Philipp Ovens this morning related to psychiatry consult note which stated "continue bedside sitter." Patient has not had a sitter and has not been under suicide precautions during this admission. RN wanted to clarify if patient should be on suicide precautions for remainder of hospitalization which would include a bedside sitter and removing all of patient's personal belongings from room. Per Dr. Philipp Ovens, plan is to hold off on initiating these precautions at this time and stated that during morning rounds patient will be reassessed by MD. Patient is currently resting comfortably. Will continue to monitor.

## 2019-01-07 NOTE — Social Work (Addendum)
4:08 pm [facility] can accept patient this evening at 8:30 pm. Patient will go to room 302 at the facility. Nurse to call report to [phone] before patient is picked up. Will be transported by [facility]- pickup is scheduled for 8 pm. Voluntary consent form is on patient's chart. CSW asked patient if he would like for CSW to alert sister that he is going to [facility]; patient declined permission to update sister.  12:15 pm Patient signed voluntary consent form for inpatient psychiatric treatment. Discussed that referral was made to [facility] and awaiting review of referral.  CSW spoke to [facility]- they are still reviewing referral. CSW to follow.  Estanislado Emms, LCSW [phone]

## 2019-01-07 NOTE — Progress Notes (Signed)
   Subjective: Patient was seen and evaluated at bedside on morning rounds. He mentions his left arm feels better. No acute events overnight. He continuously endorses depression and suicidal ideation. Does not have any acute complaint. We talked about we have hept him in the hospital since he is not safe to go home due to suicidal thought and per psychiatrics recommendation we are going to admit him in psychiatric service. He verbalizes understanding and agrees with the plan. He endorses that he will let the nurse or [place] know if serious suicidal though comes to his mind.    Objective:  Vital signs in last 24 hours: Vitals:   01/06/19 1751 01/06/19 2110 01/07/19 0507 01/07/19 0851  BP: 110/69 102/66 107/63 116/73  Pulse:  69 63 75  Resp:      Temp:  98.2 F (36.8 C) 97.9 F (36.6 C)   TempSrc:      SpO2:  98% 99%   Weight:      Height:       Physical exam: Vital signs reviewed, nursing notes reviewed General: Lying back in the bed in no acute distress CV: RRR, normal S1-S2, no murmur Pulmonary exam: CTA bilaterally, no crackle, no wheezing Abdomen: Is soft and nontender to palpation Extremities: Left arm with no erythema, no evidence of active infection, pulses are palpable Psychiat exam: Patient has depressed mood  Assessment/Plan:  Principal Problem:   MDD (major depressive disorder), single episode, severe , no psychosis ([place]) Active Problems:   Acute respiratory failure with hypoxia (HCC)   End stage renal disease ([place])   Hypertension   Renal osteodystrophy   Homelessness  Depression: Likely situational due to housing problem and feeling abandoned by family. Continuously reports depressed mood and suicidal ideation and plan.  Psychiatric consulted and saw the patient yesterday and recommended psychiatric admission. He will need a  psychiatric service that provide HD and this may limit his options. Dispo pending for placement and transferring topsychiatric hospital.  Patient again agreed to call us if suicidal thoughts progress gets to where he might take action.   -Continue Lexapro 10 mg QD -Suicidal precautions -Awaiting for psychiatric admission  ESRD on HD T/Th/Sat: Relocated to [place] without outpatient dialysis center. He has been set up [facility], T/Th/Sa shift. Unfortunately, he isunable to be discharged due to homelessness and no transportation to dialysis.   -Continue HD (Nephrology consulted for dialysis needs; appreciate assistance)  POD 11Left Upper Extremity JDB:[id number] by mediate nerve damage during surgery now with neuropathy and "gore-tex"reaction that has resolved.  -Continue gabapentin 300 mg qhsfor peripheral neuropathy  PVD Thrombosis of aortobifemoral bypass requiring embolectomy and redo 10/2018 - Continue ASA 81 daily - Continue eliquis 2.5 mg BID  QAE:SLPNP restrict, lytesper dialysis, renal diet VTE YYF:[id number] Code Status: FULL  Dispo: Anticipated discharge pending shelter or housing arrangements and transportation to dialysis. Dewayne Hatch, MD 01/07/2019, 1:15 PM Pager: [phone]

## 2019-01-07 NOTE — Plan of Care (Signed)
Patient newly admitted, hasn't had time to progress.   Problem: Education: Goal: Knowledge of [facility] Education information/materials will improve Outcome: Not Progressing Goal: Emotional status will improve Outcome: Not Progressing Goal: Mental status will improve Outcome: Not Progressing Goal: Verbalization of understanding the information provided will improve Outcome: Not Progressing   Problem: Safety: Goal: Periods of time without injury will increase Outcome: Not Progressing   Problem: Education: Goal: Utilization of techniques to improve thought processes will improve Outcome: Not Progressing Goal: Knowledge of the prescribed therapeutic regimen will improve Outcome: Not Progressing   Problem: Coping: Goal: Coping ability will improve Outcome: Not Progressing Goal: Will verbalize feelings Outcome: Not Progressing

## 2019-01-08 DIAGNOSIS — F321 Major depressive disorder, single episode, moderate: Secondary | ICD-10-CM

## 2019-01-08 LAB — HEMOGLOBIN A1C
Hgb A1c MFr Bld: 5.5 % (ref 4.8–5.6)
Mean Plasma Glucose: 111.15 mg/dL

## 2019-01-08 LAB — LIPID PANEL
CHOL/HDL RATIO: 3.1 ratio
Cholesterol: 101 mg/dL (ref 0–200)
HDL: 33 mg/dL — ABNORMAL LOW (ref >40)
LDL Cholesterol: 37 mg/dL (ref 0–99)
Triglycerides: 156 mg/dL — ABNORMAL HIGH (ref <150)
VLDL: 31 mg/dL (ref 0–40)

## 2019-01-08 LAB — CBC
HCT: 27.4 % — ABNORMAL LOW (ref 39.0–52.0)
Hemoglobin: 9 g/dL — ABNORMAL LOW (ref 13.0–17.0)
MCH: 28.9 pg (ref 26.0–34.0)
MCHC: 32.8 g/dL (ref 30.0–36.0)
MCV: 88.1 fL (ref 80.0–100.0)
Platelets: 186 10*3/uL (ref 150–400)
RBC: 3.11 MIL/uL — ABNORMAL LOW (ref 4.22–5.81)
RDW: 13.8 % (ref 11.5–15.5)
WBC: 7.6 10*3/uL (ref 4.0–10.5)
nRBC: 0 % (ref 0.0–0.2)

## 2019-01-08 LAB — PHOSPHORUS: Phosphorus: 6.5 mg/dL — ABNORMAL HIGH (ref 2.5–4.6)

## 2019-01-08 LAB — TSH: TSH: 2.193 u[IU]/mL (ref 0.350–4.500)

## 2019-01-08 MED ORDER — SODIUM CHLORIDE 0.9 % IV SOLN: 100.0000 mL | INTRAVENOUS | Status: DC | PRN

## 2019-01-08 MED ORDER — ALTEPLASE 2 MG IJ SOLR
2.0000 mg | Freq: Once | INTRAMUSCULAR | Status: DC | PRN
  Filled 2019-01-08: qty 2

## 2019-01-08 MED ORDER — CHLORHEXIDINE GLUCONATE CLOTH 2 % EX PADS
6.0000 | MEDICATED_PAD | Freq: Every day | CUTANEOUS | Status: DC
  Administered 2019-01-11 – 2019-01-25 (×7): 6 via TOPICAL

## 2019-01-08 MED ORDER — HEPARIN SODIUM (PORCINE) 1000 UNIT/ML DIALYSIS
1000.0000 [IU] | INTRAMUSCULAR | Status: DC | PRN
  Filled 2019-01-08: qty 1

## 2019-01-08 MED ORDER — LIDOCAINE-PRILOCAINE 2.5-2.5 % EX CREA
1.0000 "application " | TOPICAL_CREAM | CUTANEOUS | Status: DC | PRN
  Filled 2019-01-08: qty 5

## 2019-01-08 MED ORDER — PENTAFLUOROPROP-TETRAFLUOROETH EX AERO
1.0000 "application " | INHALATION_SPRAY | CUTANEOUS | Status: DC | PRN
  Filled 2019-01-08: qty 30

## 2019-01-08 MED ORDER — HEPARIN SODIUM (PORCINE) 1000 UNIT/ML DIALYSIS
2000.0000 [IU] | INTRAMUSCULAR | Status: DC | PRN
  Filled 2019-01-08: qty 2

## 2019-01-08 MED ORDER — FLUOXETINE HCL 10 MG PO CAPS: 10.0000 mg | ORAL_CAPSULE | Freq: Every day | ORAL | Status: DC

## 2019-01-08 MED ORDER — LIDOCAINE HCL (PF) 1 % IJ SOLN
5.0000 mL | INTRAMUSCULAR | Status: DC | PRN
  Filled 2019-01-08: qty 5

## 2019-01-08 NOTE — Consult Note (Signed)
[facility] CONSULT NOTE    Date: 01/08/2019                  Patient Name:  Brett Small  MRN: [id number]  DOB: [birth year]  Age / Sex: 43 y.o., male         PCP: Patient, [name]                 Service Requesting Consult: Psychiatry                 Reason for Consult: Management of ESRD            History of Present Illness: Patient is a 43 y.o. male with a PMHx of ESRD on HD TTHS, hypertension, secondary hyperparathyroidism, depression, anemia chronic kidney disease,aortobifemoral bypass, who was admitted to [facility] on 01/07/2019 for evaluation and treatment of major depressive disorder.  He was recently admitted to [facility].   He was seen by [place] [facility] there.  Prior to this patient was undergoing dialysis in [place].  He had recent left upper extremity AV fistula placement is currently using right internal jugular PermCath for dialysis treatments.  Patient currently homeless.  It appears that [facility] had accepted the patient to perform outpt dialysis, however he is here now at [facility] for treatment of depression.    Medications: Outpatient medications: Medications Prior to Admission  Medication Sig Dispense Refill Last Dose  . acetaminophen (TYLENOL) 325 MG tablet Take 650 mg by mouth every 6 (six) hours as needed for mild pain or headache.   Past Week at Unknown time  . amLODipine (NORVASC) 5 MG tablet Take 5 mg by mouth daily. hold if Systolic reading is <921   01/07/2019 at Unknown time  . apixaban (ELIQUIS) 2.5 MG TABS tablet Take 1 tablet (2.5 mg total) by mouth 2 (two) times daily. 60 tablet 0 01/07/2019 at Unknown time  . aspirin EC 81 MG tablet Take 81 mg by mouth daily.   01/07/2019 at Unknown time  . atorvastatin (LIPITOR) 80 MG tablet Take 80 mg by mouth at bedtime.   01/06/2019 at Unknown time  . B Complex-C-Folic Acid (NEPHRO-VITE PO) Take 1 tablet by mouth daily.   01/07/2019 at Unknown time  . carvedilol  (COREG) 25 MG tablet Take 25 mg by mouth 2 (two) times daily with a meal.   01/07/2019 at Unknown time  . escitalopram (LEXAPRO) 10 MG tablet Take 1 tablet (10 mg total) by mouth daily. 30 tablet 1 01/07/2019 at Unknown time  . gabapentin (NEURONTIN) 300 MG capsule Take 300 mg by mouth 3 (three) times daily.   01/07/2019 at Unknown time  . hydrALAZINE (APRESOLINE) 25 MG tablet Take 25 mg by mouth every 8 (eight) hours.   01/07/2019 at Unknown time  . hydrOXYzine (ATARAX/VISTARIL) 25 MG tablet Take 25 mg by mouth 3 (three) times daily as needed for anxiety.   Past Week at Unknown time  . sevelamer carbonate (RENVELA) 800 MG tablet Take 2 tablets (1,600 mg total) by mouth 3 (three) times daily with meals. 180 tablet 0 01/07/2019 at Unknown time    Current medications: Current Facility-Administered Medications  Medication Dose Route Frequency Provider Last Rate Last Dose  . acetaminophen (TYLENOL) tablet 650 mg  650 mg Oral Q6H PRN Tennis Ship, MD      . amLODipine (NORVASC) tablet 5 mg  5 mg Oral Daily Tennis Ship, MD   Stopped at 01/08/19 0759  .  apixaban (ELIQUIS) tablet 2.5 mg  2.5 mg Oral BID Tennis Ship, MD   Stopped at 01/08/19 0759  . aspirin EC tablet 81 mg  81 mg Oral Daily O'Neal, Fredirick Maudlin, MD      . atorvastatin (LIPITOR) tablet 80 mg  80 mg Oral QHS Tennis Ship, MD   80 mg at 01/07/19 2241  . carvedilol (COREG) tablet 25 mg  25 mg Oral BID WC Tennis Ship, MD   Stopped at 01/08/19 0800  . escitalopram (LEXAPRO) tablet 10 mg  10 mg Oral Daily Tennis Ship, MD   10 mg at 01/08/19 0752  . gabapentin (NEURONTIN) capsule 300 mg  300 mg Oral TID Tennis Ship, MD   300 mg at 01/08/19 0752  . hydrALAZINE (APRESOLINE) tablet 25 mg  25 mg Oral Q8H Tennis Ship, MD   25 mg at 01/08/19 0630  . hydrOXYzine (ATARAX/VISTARIL) tablet 25 mg  25 mg Oral TID PRN Tennis Ship, MD      . multivitamin (RENA-VIT) tablet 1 tablet  1 tablet Oral Daily O'Neal, Sarita, MD      . sevelamer  carbonate (RENVELA) tablet 1,600 mg  1,600 mg Oral TID WC Tennis Ship, MD   1,600 mg at 01/08/19 6010      Allergies: Allergies  Allergen Reactions  . Codeine Nausea Only    Patient questioned this (??)  . Sulfa Antibiotics Hives and Nausea And Vomiting      Past Medical History: Past Medical History:  Diagnosis Date  . Hypertension   . Renal disorder      Past Surgical History: Past Surgical History:  Procedure Laterality Date  . AORTA - FEMORAL ARTERY BYPASS GRAFT    . AV FISTULA PLACEMENT Left 12/26/2018   Procedure: INSERTION OF GORE STRETCH VASCULAR 4-7MM X  45CM IN LEFT UPPER ARM;  Surgeon: Marty Heck, MD;  Location: [facility] OR;  Service: Vascular;  Laterality: Left;     Family History: Family History  Problem Relation Age of Onset  . Hypertension Other   . Diabetes Other   . Clotting disorder Father      Social History: Social History   Socioeconomic History  . Marital status: Single    Spouse name: Not on file  . Number of children: Not on file  . Years of education: Not on file  . Highest education level: Not on file  Occupational History  . Not on file  Social Needs  . Financial resource strain: Not on file  . Food insecurity:    Worry: Not on file    Inability: Not on file  . Transportation needs:    Medical: Not on file    Non-medical: Not on file  Tobacco Use  . Smoking status: Former Smoker    Last attempt to quit: 06/26/2018    Years since quitting: 0.5  . Smokeless tobacco: Never Used  . Tobacco comment: smoked for 30 years   Substance and Sexual Activity  . Alcohol use: Not Currently  . Drug use: Never  . Sexual activity: Not on file  Lifestyle  . Physical activity:    Days per week: Not on file    Minutes per session: Not on file  . Stress: Not on file  Relationships  . Social connections:    Talks on phone: Not on file    Gets together: Not on file    Attends religious service: Not on file    Active member of club or  organization: Not on file  Attends meetings of clubs or organizations: Not on file    Relationship status: Not on file  . Intimate partner violence:    Fear of current or ex partner: Not on file    Emotionally abused: Not on file    Physically abused: Not on file    Forced sexual activity: Not on file  Other Topics Concern  . Not on file  Social History Narrative  . Not on file     Review of Systems: Review of Systems  Constitutional: Negative for chills, fever and weight loss.  HENT: Negative for congestion, hearing loss and tinnitus.   Eyes: Negative for blurred vision and double vision.  Respiratory: Negative for cough, hemoptysis and sputum production.   Cardiovascular: Negative for chest pain, palpitations and orthopnea.  Gastrointestinal: Negative for heartburn, nausea and vomiting.  Genitourinary: Negative for dysuria, frequency and urgency.  Musculoskeletal: Negative for back pain and myalgias.  Skin: Negative for itching and rash.  Neurological: Negative for dizziness and focal weakness.  Endo/Heme/Allergies: Does not bruise/bleed easily.  Psychiatric/Behavioral: Positive for depression and suicidal ideas.     Vital Signs: Blood pressure 114/74, pulse 77, temperature 98.2 F (36.8 C), temperature source Oral, resp. rate 18, height 5\' 1"  (1.549 m), weight 46.3 kg, SpO2 100 %.  Weight trends: Filed Weights   01/07/19 2041  Weight: 46.3 kg    Physical Exam: General: NAD, laying in bed  Head: Normocephalic, atraumatic.  Eyes: Anicteric, EOMI  Nose: Mucous membranes moist, not inflammed, nonerythematous.  Throat: Oropharynx nonerythematous, no exudate appreciated.   Neck: Supple, trachea midline.  Lungs:  Normal respiratory effort. Clear to auscultation BL without crackles or wheezes.  Heart: RRR. S1 and S2 normal without gallop, murmur, or rubs.  Abdomen:  BS normoactive. Soft, Nondistended, non-tender.  No masses or organomegaly.  Extremities: No pretibial  edema.  Neurologic: A&O X3, Motor strength is 5/5 in the all 4 extremities  Skin: No visible rashes, scars.    Lab results: Basic Metabolic Panel: Recent Labs  Lab 01/03/19 0850 01/06/19 0701  NA 133* 135  K 3.8 3.9  CL 94* 97*  CO2 23 23  GLUCOSE 84 110*  BUN 43* 58*  CREATININE 9.36* 12.11*  CALCIUM 9.0 8.6*  PHOS 8.4* 8.7*    Liver Function Tests: Recent Labs  Lab 01/03/19 0850 01/06/19 0701  ALBUMIN 3.3* 3.4*   No results for input(s): LIPASE, AMYLASE in the last 168 hours. No results for input(s): AMMONIA in the last 168 hours.  CBC: Recent Labs  Lab 01/03/19 0337 01/06/19 0701  WBC 6.4 10.1  HGB 9.2* 10.8*  HCT 28.5* 33.4*  MCV 87.2 90.8  PLT 193 228    Cardiac Enzymes: No results for input(s): CKTOTAL, CKMB, CKMBINDEX, TROPONINI in the last 168 hours.  BNP: Invalid input(s): POCBNP  CBG: No results for input(s): GLUCAP in the last 168 hours.  Microbiology: Results for orders placed or performed during the hospital encounter of 12/24/18  MRSA PCR Screening     Status: None   Collection Time: 12/27/18  1:20 PM  Result Value Ref Range Status   MRSA by PCR NEGATIVE NEGATIVE Final    Comment:        The GeneXpert MRSA Assay (FDA approved for NASAL specimens only), is one component of a comprehensive MRSA colonization surveillance program. It is not intended to diagnose MRSA infection nor to guide or monitor treatment for MRSA infections. Performed at [facility], [place] [address], [place], [place]  [address]     Coagulation Studies: No results for input(s): LABPROT, INR in the last 72 hours.  Urinalysis: No results for input(s): COLORURINE, LABSPEC, PHURINE, GLUCOSEU, HGBUR, BILIRUBINUR, KETONESUR, PROTEINUR, UROBILINOGEN, NITRITE, LEUKOCYTESUR in the last 72 hours.  Invalid input(s): APPERANCEUR    Imaging:  No results found.   Assessment & Plan: Pt is a 44 y.o. male with a PMHx of ESRD on HD TTHS, hypertension, secondary  hyperparathyroidism, depression, anemia chronic kidney disease,aortobifemoral bypass, who was admitted to [facility] on 01/07/2019 for evaluation and treatment of major depressive disorder.  [facility]/TTHS, however patient had not formally started dialysis there yet.  Patient currently homeless.   1.  ESRD on HD TTHS:  Patient due for hemodialysis today.  We will use his right internal jugular PermCath now.  His left upper extremity fistula appears to be developing nicely however.  Depending upon where he is placed, we may need to rearrange his outpt dialysis provision.    2.  Anemia chronic kidney disease.  Hemoglobin currently 10.8.  Hold off on Epogen for now.  3.  Secondary hyperparathyroidism.  Heck intact PTH and phosphorus with dialysis today.  Otherwise maintain the patient on Renvela 2 tablets by mouth 3 times a day with meals.  4.  Hypertension.  Maintain the patient on current doses of amlodipine, carvedilol, and hydralazine for now.  5.Consultation.

## 2019-01-08 NOTE — Plan of Care (Signed)
Patient is alert and oriented X 4, denies SI, HI and AVH. Patient is pleasant and cooperative. BP this morning was 114/74. Blood pressure medications were held due to possible dialysis today and RN did not want BP to fall lower. Patient rates pain 0/10. Patient did eat breakfast, but did not want to eat lunch stating " I am still full from this morning." Safety checks to continue Q 15 minutes. Problem: Education: Goal: Knowledge of [facility] Education information/materials will improve Outcome: Progressing Goal: Emotional status will improve Outcome: Progressing Goal: Mental status will improve Outcome: Progressing Goal: Verbalization of understanding the information provided will improve Outcome: Progressing   Problem: Safety: Goal: Periods of time without injury will increase Outcome: Progressing   Problem: Education: Goal: Utilization of techniques to improve thought processes will improve Outcome: Progressing Goal: Knowledge of the prescribed therapeutic regimen will improve Outcome: Progressing

## 2019-01-08 NOTE — Progress Notes (Signed)
HD Tx End   01/08/19 1859  Vital Signs  Pulse Rate 76  Resp 12  BP 138/82  Oxygen Therapy  SpO2 100 %  During Hemodialysis Assessment  Blood Flow Rate (mL/min) 400 mL/min  Arterial Pressure (mmHg) -160 mmHg  Venous Pressure (mmHg) 120 mmHg  Transmembrane Pressure (mmHg) 60 mmHg  Ultrafiltration Rate (mL/min) 500 mL/min  Dialysate Flow Rate (mL/min) 800 ml/min  Conductivity: Machine  14  HD Safety Checks Performed Yes  KECN 70.2 KECN  Dialysis Fluid Bolus Normal Saline  Bolus Amount (mL) 250 mL (250)  Intra-Hemodialysis Comments Tolerated well;Tx completed (pt resting comfortably)

## 2019-01-08 NOTE — Progress Notes (Signed)
Post HD Tx   01/08/19 1909  Vital Signs  Temp 97.8 F (36.6 C)  Temp Source Oral  Pulse Rate 75  Pulse Rate Source Monitor  Resp 12  BP 124/84  BP Location Right Arm  BP Method Automatic  Patient Position (if appropriate) Sitting  Oxygen Therapy  SpO2 100 %  O2 Device Room Air  Pain Assessment  Pain Scale 0-10  Pain Score 0  Dialysis Weight  Weight 45.3 kg  Type of Weight Post-Dialysis  Post-Hemodialysis Assessment  Rinseback Volume (mL) 250 mL  KECN 70.2 V  Dialyzer Clearance Lightly streaked  Duration of HD Treatment -hour(s) 3.5 hour(s)  Hemodialysis Intake (mL) 500 mL  UF Total -Machine (mL) 1500 mL  Net UF (mL) 1000 mL  Tolerated HD Treatment Yes  Post-Hemodialysis Comments Pt stable  Fistula / Graft Left Upper arm Arteriovenous vein graft  Placement Date/Time: 12/26/18 1024   Placed prior to admission: No  Orientation: Left  Access Location: Upper arm  Access Type: (c) Arteriovenous vein graft  Site Condition No complications  Fistula / Graft Assessment Present;Thrill;Bruit  Drainage Description None  Hemodialysis Catheter Right Subclavian  No Placement Date or Time found.   Placed prior to admission: Yes  Orientation: Right  Access Location: Subclavian  Site Condition No complications  Blue Lumen Status Flushed;Saline locked;Capped (Central line);Heparin locked  Red Lumen Status Flushed;Saline locked;Capped (Central line);Heparin locked  Purple Lumen Status N/A  Catheter fill solution Heparin 1000 units/ml  Catheter fill volume (Arterial) 1.5 cc (1.5)  Catheter fill volume (Venous) 1.5  Dressing Type Biopatch  Dressing Status Clean;Dry;Intact;Dressing changed;Antimicrobial disc changed  Interventions New dressing;Dressing changed  Drainage Description None  Post treatment catheter status Capped and Clamped
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
## 2019-01-08 NOTE — BHH Group Notes (Addendum)
LCSW Group Therapy Note  01/08/2019 12:39 PM  Type of Therapy/Topic:  Group Therapy:  Balance in Life  Participation Level:  Did Not Attend  Description of Group:    This group will address the concept of balance and how it feels and looks when one is unbalanced. Patients will be encouraged to process areas in their lives that are out of balance and identify reasons for remaining unbalanced. Facilitators will guide patients in utilizing problem-solving interventions to address and correct the stressor making their life unbalanced. Understanding and applying boundaries will be explored and addressed for obtaining and maintaining a balanced life. Patients will be encouraged to explore ways to assertively make their unbalanced needs known to significant others in their lives, using other group members and facilitator for support and feedback.  Therapeutic Goals: 1. Patient will identify two or more emotions or situations they have that consume much of in their lives. 2. Patient will identify signs/triggers that life has become out of balance:  3. Patient will identify two ways to set boundaries in order to achieve balance in their lives:  4. Patient will demonstrate ability to communicate their needs through discussion and/or role plays  Summary of Patient Progress: x     Therapeutic Modalities:   Cognitive Behavioral Therapy Solution-Focused Therapy Assertiveness Training  Evalina Field, MSW, LCSW Clinical Social Work 01/08/2019 12:39 PM

## 2019-01-08 NOTE — Progress Notes (Signed)
Pre HD Assessment   01/08/19 1520  Neurological  Level of Consciousness Alert  Orientation Level Oriented X4  Respiratory  Respiratory Pattern Regular  Bilateral Breath Sounds Clear;Diminished  Cough None  Cardiac  Pulse Regular  Heart Sounds S1, S2  Jugular Venous Distention (JVD) No  Cardiac Rhythm NSR  Vascular  R Radial Pulse +2  L Radial Pulse +2  Edema Left upper extremity  LUE Edema Non-pitting  Integumentary  Integumentary (WDL) X  Skin Color Appropriate for ethnicity  Skin Condition Dry  Skin Integrity Catheter entry/exit site;Surgical Incision (see LDA)  Musculoskeletal  Musculoskeletal (WDL) WDL  GU Assessment  Genitourinary (WDL) X  Genitourinary Symptoms Oliguria (HD pt )  Psychosocial  Psychosocial (WDL) WDL  Emotional support given Given to patient

## 2019-01-08 NOTE — H&P (Signed)
Psychiatric Admission Assessment Adult  Patient Identification: Brett Small MRN:  [id number] Date of Evaluation:  01/08/2019 Chief Complaint:  Depression Principal Diagnosis: Depression Diagnosis:  Principal Problem:   Depression Active Problems:   End stage renal disease ([place])   Hypertension   Homelessness  History of Present Illness: Patient seen chart reviewed.  This is a patient with no past psychiatric history who is transferred to [place] from [place].  He presented there in fluid overload and required admission to the medical service and immediate dialysis.  He has now medically stabilized.  He was seen by psychiatric consult in [place] and they recommended sending him to our facility.  On interview today the patient says his mood is down and low.  He feels like he is under a lot of stress all the time.  Major life stressor is that he is completely homeless.  Patient is only been on dialysis since last year at that time [place].  Subsequently moved in with his father then moved to [place] and now has found himself back in [place] with no place to stay.  He admits that he missed some dialysis treatments in the interim.  Denies any hallucinations.  He says occasionally he has thoughts about wishing that he would just die but has never had any thought of actually trying to kill himself. Associated Signs/Symptoms: Depression Symptoms:  depressed mood, anhedonia, (Hypo) Manic Symptoms:  None Anxiety Symptoms:  Excessive Worry, Psychotic Symptoms:  None PTSD Symptoms: Negative Total Time spent with patient: 1 hour  Past Psychiatric History: Patient has never seen a psychiatrist therapist or mental health provider in the past.  Never been prescribed any psychiatric medicine.  Never tried to kill himself.  Never been in a psychiatric hospital.  Is the patient at risk to self? No.  Has the patient been a risk to self in the past 6 months? No.  Has  the patient been a risk to self within the distant past? No.  Is the patient a risk to others? No.  Has the patient been a risk to others in the past 6 months? No.  Has the patient been a risk to others within the distant past? No.   Prior Inpatient Therapy:   Prior Outpatient Therapy:    Alcohol Screening: 1. How often do you have a drink containing alcohol?: Never 2. How many drinks containing alcohol do you have on a typical day when you are drinking?: 1 or 2 3. How often do you have six or more drinks on one occasion?: Never AUDIT-C Score: 0 4. How often during the last year have you found that you were not able to stop drinking once you had started?: Never 5. How often during the last year have you failed to do what was normally expected from you becasue of drinking?: Never 6. How often during the last year have you needed a first drink in the morning to get yourself going after a heavy drinking session?: Never 7. How often during the last year have you had a feeling of guilt of remorse after drinking?: Never 8. How often during the last year have you been unable to remember what happened the night before because you had been drinking?: Never 9. Have you or someone else been injured as a result of your drinking?: No 10. Has a relative or friend or a doctor or another health worker been concerned about your drinking or suggested you cut down?: No  Alcohol Use Disorder Identification Test Final Score (AUDIT): 0 Alcohol Brief Interventions/Follow-up: AUDIT Score <7 follow-up not indicated Substance Abuse History in the last 12 months:  No. Consequences of Substance Abuse: Negative Previous Psychotropic Medications: No  Psychological Evaluations: No  Past Medical History:  Past Medical History:  Diagnosis Date  . Hypertension   . Renal disorder     Past Surgical History:  Procedure Laterality Date  . AORTA - FEMORAL ARTERY BYPASS GRAFT    . AV FISTULA PLACEMENT Left 12/26/2018    Procedure: INSERTION OF GORE STRETCH VASCULAR 4-7MM X  45CM IN LEFT UPPER ARM;  Surgeon: Marty Heck, MD;  Location: [facility] OR;  Service: Vascular;  Laterality: Left;   Family History:  Family History  Problem Relation Age of Onset  . Hypertension Other   . Diabetes Other   . Clotting disorder Father    Family Psychiatric  History: Denies any family history Tobacco Screening: Have you used any form of tobacco in the last 30 days? (Cigarettes, Smokeless Tobacco, Cigars, and/or Pipes): No Social History:  Social History   Substance and Sexual Activity  Alcohol Use Not Currently     Social History   Substance and Sexual Activity  Drug Use Never    Additional Social History: Marital status: Single Are you sexually active?: No What is your sexual orientation?: Heterosexual Has your sexual activity been affected by drugs, alcohol, medication, or emotional stress?: Pt denies. Does patient have children?: Yes How many children?: 2 How is patient's relationship with their children?: Pt reports that he has two adult children.  Pt reports "I don't have one.  We talk once in awhile".                          Allergies:   Allergies  Allergen Reactions  . Codeine Nausea Only    Patient questioned this (??)  . Sulfa Antibiotics Hives and Nausea And Vomiting   Lab Results: No results found for this or any previous visit (from the past 48 hour(s)).  Blood Alcohol level:  No results found for: [facility]  Metabolic Disorder Labs:  No results found for: HGBA1C, MPG No results found for: PROLACTIN No results found for: CHOL, TRIG, HDL, CHOLHDL, VLDL, LDLCALC  Current Medications: Current Facility-Administered Medications  Medication Dose Route Frequency Provider Last Rate Last Dose  . acetaminophen (TYLENOL) tablet 650 mg  650 mg Oral Q6H PRN Tennis Ship, MD      . amLODipine (NORVASC) tablet 5 mg  5 mg Oral Daily Tennis Ship, MD   Stopped at 01/08/19 0759  . apixaban  (ELIQUIS) tablet 2.5 mg  2.5 mg Oral BID Tennis Ship, MD   Stopped at 01/08/19 0759  . aspirin EC tablet 81 mg  81 mg Oral Daily O'Neal, Fredirick Maudlin, MD      . atorvastatin (LIPITOR) tablet 80 mg  80 mg Oral QHS Tennis Ship, MD   80 mg at 01/07/19 2241  . carvedilol (COREG) tablet 25 mg  25 mg Oral BID WC Tennis Ship, MD   Stopped at 01/08/19 0800  . escitalopram (LEXAPRO) tablet 10 mg  10 mg Oral Daily Tennis Ship, MD   10 mg at 01/08/19 0752  . gabapentin (NEURONTIN) capsule 300 mg  300 mg Oral TID Tennis Ship, MD   300 mg at 01/08/19 0752  . [START ON 01/09/2019] heparin injection 2,000 Units  2,000 Units Dialysis PRN Roney Jaffe, MD      .  hydrALAZINE (APRESOLINE) tablet 25 mg  25 mg Oral Q8H Tennis Ship, MD   25 mg at 01/08/19 0630  . hydrOXYzine (ATARAX/VISTARIL) tablet 25 mg  25 mg Oral TID PRN Tennis Ship, MD      . multivitamin (RENA-VIT) tablet 1 tablet  1 tablet Oral Daily O'Neal, Sarita, MD      . sevelamer carbonate (RENVELA) tablet 1,600 mg  1,600 mg Oral TID WC Tennis Ship, MD   1,600 mg at 01/08/19 6948   PTA Medications: Medications Prior to Admission  Medication Sig Dispense Refill Last Dose  . acetaminophen (TYLENOL) 325 MG tablet Take 650 mg by mouth every 6 (six) hours as needed for mild pain or headache.   Past Week at Unknown time  . amLODipine (NORVASC) 5 MG tablet Take 5 mg by mouth daily. hold if Systolic reading is <546   01/07/2019 at Unknown time  . apixaban (ELIQUIS) 2.5 MG TABS tablet Take 1 tablet (2.5 mg total) by mouth 2 (two) times daily. 60 tablet 0 01/07/2019 at Unknown time  . aspirin EC 81 MG tablet Take 81 mg by mouth daily.   01/07/2019 at Unknown time  . atorvastatin (LIPITOR) 80 MG tablet Take 80 mg by mouth at bedtime.   01/06/2019 at Unknown time  . B Complex-C-Folic Acid (NEPHRO-VITE PO) Take 1 tablet by mouth daily.   01/07/2019 at Unknown time  . carvedilol (COREG) 25 MG tablet Take 25 mg by mouth 2 (two) times daily with a meal.    01/07/2019 at Unknown time  . escitalopram (LEXAPRO) 10 MG tablet Take 1 tablet (10 mg total) by mouth daily. 30 tablet 1 01/07/2019 at Unknown time  . gabapentin (NEURONTIN) 300 MG capsule Take 300 mg by mouth 3 (three) times daily.   01/07/2019 at Unknown time  . hydrALAZINE (APRESOLINE) 25 MG tablet Take 25 mg by mouth every 8 (eight) hours.   01/07/2019 at Unknown time  . hydrOXYzine (ATARAX/VISTARIL) 25 MG tablet Take 25 mg by mouth 3 (three) times daily as needed for anxiety.   Past Week at Unknown time  . sevelamer carbonate (RENVELA) 800 MG tablet Take 2 tablets (1,600 mg total) by mouth 3 (three) times daily with meals. 180 tablet 0 01/07/2019 at Unknown time    Musculoskeletal: Strength & Muscle Tone: within normal limits Gait & Station: normal Patient leans: N/A  Psychiatric Specialty Exam: Physical Exam  Nursing note and vitals reviewed. Constitutional: He appears well-developed and well-nourished.  HENT:  Head: Normocephalic and atraumatic.  Eyes: Pupils are equal, round, and reactive to light. Conjunctivae are normal.  Neck: Normal range of motion.  Cardiovascular: Regular rhythm and normal heart sounds.  Respiratory: Effort normal. No respiratory distress.  GI: Soft.  Musculoskeletal: Normal range of motion.  Neurological: He is alert.  Skin: Skin is warm and dry.  Psychiatric: His affect is blunt. His speech is delayed. He is slowed. Thought content is not paranoid. Cognition and memory are normal. He expresses impulsivity. He expresses no homicidal and no suicidal ideation.    Review of Systems  Constitutional: Negative.   HENT: Negative.   Eyes: Negative.   Respiratory: Negative.   Cardiovascular: Negative.   Gastrointestinal: Negative.   Musculoskeletal: Negative.   Skin: Negative.   Neurological: Negative.   Psychiatric/Behavioral: Positive for depression. Negative for hallucinations, memory loss, substance abuse and suicidal ideas. The patient has insomnia. The  patient is not nervous/anxious.     Blood pressure 114/74, pulse 77, temperature 98.2 F (36.8 C),  temperature source Oral, resp. rate 18, height _0  (1.549 m), weight 46.3 kg, SpO2 100 %.Body mass index is 19.27 kg/m.  General Appearance: Casual  Eye Contact:  Fair  Speech:  Slow  Volume:  Decreased  Mood:  Dysphoric  Affect:  Congruent  Thought Process:  Goal Directed  Orientation:  Full (Time, Place, and Person)  Thought Content:  Logical  Suicidal Thoughts:  No  Homicidal Thoughts:  No  Memory:  Immediate;   Fair Recent;   Fair Remote;   Fair  Judgement:  Fair  Insight:  Shallow  Psychomotor Activity:  Decreased  Concentration:  Concentration: Fair  Recall:  [employer] of Knowledge:  Fair  Language:  Fair  Akathisia:  No  Handed:  Right  AIMS (if indicated):     Assets:  Desire for Improvement  ADL's:  Impaired  Cognition:  WNL  Sleep:  Number of Hours: 7.5    Treatment Plan Summary: Daily contact with patient to assess and evaluate symptoms and progress in treatment, Medication management and Plan Patient was started on citalopram 10 mg a day at admission.  He had been agreeable to this.  Patient has been educated that antidepressants may take a couple weeks to work.  Meanwhile main issue here is making sure that he gets his dialysis needs met.  I have already asked the nephrologist to see him so that he can get dialysis in the hospital.  Social work has met with him and we will follow-up with him about trying to find a place to live.  He is homeless now and without any stability and without any designated dialysis center.  Continue medications for blood pressure and for needs around renal failure as well.  Observation Level/Precautions:  15 minute checks  Laboratory:  Chemistry Profile  Psychotherapy:    Medications:    Consultations:    Discharge Concerns:    Estimated LOS:  Other:     Physician Treatment Plan for Primary Diagnosis: Depression Long Term  Goal(s): Improvement in symptoms so as ready for discharge  Short Term Goals: Ability to verbalize feelings will improve and Ability to disclose and discuss suicidal ideas  Physician Treatment Plan for Secondary Diagnosis: Principal Problem:   Depression Active Problems:   End stage renal disease ([place])   Hypertension   Homelessness  Long Term Goal(s): Improvement in symptoms so as ready for discharge  Short Term Goals: Compliance with prescribed medications will improve  I certify that inpatient services furnished can reasonably be expected to improve the patient's condition.    Alethia Berthold, MD 3/19/20203:45 PM

## 2019-01-08 NOTE — BHH Suicide Risk Assessment (Signed)
Brett Small INPATIENT:  Family/Significant Other Suicide Prevention Education  Suicide Prevention Education:  Education Completed; Brett Small, [phone] has been identified by the patient as the family member/significant other with whom the patient will be residing, and identified as the person(s) who will aid the patient in the event of a mental health crisis (suicidal ideations/suicide attempt).  With written consent from the patient, the family member/significant other has been provided the following suicide prevention education, prior to the and/or following the discharge of the patient.  The suicide prevention education provided includes the following:  Suicide risk factors  Suicide prevention and interventions  National Suicide Hotline telephone number  [facility] assessment telephone number  [facility] Emergency Assistance [place] and/or Residential Mobile Crisis Unit telephone number  Request made of family/significant other to:  Remove weapons (e.g., guns, rifles, knives), all items previously/currently identified as safety concern.    Remove drugs/medications (over-the-counter, prescriptions, illicit drugs), all items previously/currently identified as a safety concern.  The family member/significant other verbalizes understanding of the suicide prevention education information provided.  The family member/significant other agrees to remove the items of safety concern listed above.  Father reports that the patient living with him at this time is a financial strain.  Father reports that he did not tell the patient that he could not return home but that the patient decided to leave.  At this time it does not look like the patient will be able to return to his parents home at this time.   Rozann Lesches 01/08/2019, 10:27 AM

## 2019-01-08 NOTE — Progress Notes (Signed)
HD Tx Start   01/08/19 1526  Vital Signs  Pulse Rate 68  Resp 12  BP 126/74  Oxygen Therapy  SpO2 100 %  During Hemodialysis Assessment  Blood Flow Rate (mL/min) 400 mL/min  Arterial Pressure (mmHg) -140 mmHg  Venous Pressure (mmHg) 100 mmHg  Transmembrane Pressure (mmHg) 50 mmHg  Ultrafiltration Rate (mL/min) 500 mL/min (500 mL per HOUR removal)  Dialysate Flow Rate (mL/min) 800 ml/min  Conductivity: Machine  14  HD Safety Checks Performed Yes  Dialysis Fluid Bolus Normal Saline  Bolus Amount (mL) 250 mL  Intra-Hemodialysis Comments Tx initiated  Hemodialysis Catheter Right Subclavian  No Placement Date or Time found.   Placed prior to admission: Yes  Orientation: Right  Access Location: Subclavian  Site Condition No complications  Blue Lumen Status Infusing  Red Lumen Status Infusing  Purple Lumen Status N/A  Dressing Type Biopatch  Dressing Status Clean;Dry;Dressing changed;Antimicrobial disc changed  Interventions New dressing;Dressing changed  Drainage Description None

## 2019-01-08 NOTE — Progress Notes (Signed)
Pre HD Tx   01/08/19 1520  Vital Signs  Temp 97.9 F (36.6 C)  Temp Source Oral  Pulse Rate 68  Pulse Rate Source Monitor  Resp 12  BP 125/79  BP Location Right Arm  BP Method Automatic  Patient Position (if appropriate) Sitting  Oxygen Therapy  SpO2 99 %  O2 Device Room Air  Pain Assessment  Pain Scale 0-10  Pain Score 0  Dialysis Weight  Weight 46.3 kg  Type of Weight Pre-Dialysis  Time-Out for Hemodialysis  What Procedure? Hemodialysis  Pt Identifiers(min of two) First/Last Name;MRN/Account#  Correct Site? Yes  Correct Side? Yes  Correct Procedure? Yes  Consents Verified? Yes  Rad Studies Available? N/A  Safety Precautions Reviewed? Yes  Engineer, civil (consulting) Number 4  Station Number 4  UF/Alarm Test Passed  Conductivity: Meter 14  Conductivity: Machine  14  pH 7.4  Reverse Osmosis Main  Normal Saline Lot Number [id number]  Dialyzer Lot Number [id number]  Disposable Set Lot Number [id number]  Machine Temperature 98.6 F (37 C)  Musician and Audible Yes  Blood Lines Intact and Secured Yes  Pre Treatment Patient Checks  Vascular access used during treatment Catheter  Patient is receiving dialysis in a chair Yes  Hepatitis B Surface Antigen Results Negative  Date Hepatitis B Surface Antigen Drawn 12/25/18  Hepatitis B Surface Antibody  (<10)  Date Hepatitis B Surface Antibody Drawn 12/25/18  Hemodialysis Consent Verified Yes  Hemodialysis Standing Orders Initiated Yes  ECG (Telemetry) Monitor On Yes  Prime Ordered Normal Saline  Length of  DialysisTreatment -hour(s) 3.5 Hour(s)  Dialysis Treatment Comments Na 140  Dialyzer Elisio 17H NR  Dialysate 3K, 2.5 Ca  Blood Flow Rate Ordered 400 mL/min  Ultrafiltration Goal 1 Liters  Pre Treatment Labs Phosphorus;CBC  Dialysis Blood Pressure Support Ordered Normal Saline  Education / Care Plan  Dialysis Education Provided Yes  Documented Education in Care Plan Yes  Fistula / Graft Left Upper arm  Arteriovenous vein graft  Placement Date/Time: 12/26/18 1024   Placed prior to admission: No  Orientation: Left  Access Location: Upper arm  Access Type: (c) Arteriovenous vein graft  Site Condition No complications  Fistula / Graft Assessment Present;Thrill;Bruit  Drainage Description None  Hemodialysis Catheter Right Subclavian  No Placement Date or Time found.   Placed prior to admission: Yes  Orientation: Right  Access Location: Subclavian  Site Condition No complications  Blue Lumen Status Capped (Central line)  Red Lumen Status Capped (Central line)  Dressing Type Biopatch  Dressing Status Clean;Dry;Dressing changed;Antimicrobial disc changed  Interventions New dressing;Dressing changed  Drainage Description None

## 2019-01-08 NOTE — Progress Notes (Signed)
Post HD Tx Assessment   Net UF removal: 1.0 kg. Pt tolerated tx well.     01/08/19 1914  Neurological  Level of Consciousness Alert  Orientation Level Oriented X4  Respiratory  Respiratory Pattern Regular  Bilateral Breath Sounds Clear;Diminished  Cough None  Cardiac  Pulse Regular  Heart Sounds S1, S2  Jugular Venous Distention (JVD) No  Cardiac Rhythm NSR  Vascular  R Radial Pulse +2  L Radial Pulse +2  Edema Left upper extremity  LUE Edema Non-pitting  Integumentary  Integumentary (WDL) X  Skin Color Appropriate for ethnicity  Skin Condition Dry  Skin Integrity Catheter entry/exit site;Surgical Incision (see LDA)  Musculoskeletal  Musculoskeletal (WDL) WDL  GU Assessment  Genitourinary (WDL) X  Genitourinary Symptoms Oliguria (HD pt )  Psychosocial  Psychosocial (WDL) WDL  Emotional support given Given to patient

## 2019-01-08 NOTE — BHH Suicide Risk Assessment (Signed)
[facility] Admission Suicide Risk Assessment   Nursing information obtained from:  Patient Demographic factors:  Male, Living alone, Unemployed, Caucasian, Low socioeconomic status Current Mental Status:  NA Loss Factors:  Decline in physical health, Financial problems / change in socioeconomic status Historical Factors:  NA Risk Reduction Factors:  Religious beliefs about death  Total Time spent with patient: 1 hour Principal Problem: <principal problem not specified> Diagnosis:  Active Problems:   Depression  Subjective Data: Patient interviewed chart reviewed.  Patient reports mood as being down negative and sad.  Chronically fatigued.  Difficulty sleeping at night.  He denies any suicidal thoughts intent or plan.  He says that occasionally he has had thoughts of wishing he were dead because of his multiple stresses but that he has never thought of acting on it.  Denies any psychotic symptoms denies any hallucinations.  Patient's major life stresses being homeless and being dialysis dependent.  Continued Clinical Symptoms:  Alcohol Use Disorder Identification Test Final Score (AUDIT): 0 The "Alcohol Use Disorders Identification Test", Guidelines for Use in Primary Care, Second Edition.  World Pharmacologist [facility]). Score between 0-7:  no or low risk or alcohol related problems. Score between 8-15:  moderate risk of alcohol related problems. Score between 16-19:  high risk of alcohol related problems. Score 20 or above:  warrants further diagnostic evaluation for alcohol dependence and treatment.   CLINICAL FACTORS:   Depression:   Anhedonia   Musculoskeletal: Strength & Muscle Tone: within normal limits Gait & Station: normal Patient leans: N/A  Psychiatric Specialty Exam: Physical Exam  Nursing note and vitals reviewed. Constitutional: He appears well-developed and well-nourished.  HENT:  Head: Normocephalic and atraumatic.  Eyes: Pupils are equal, round, and reactive to  light. Conjunctivae are normal.  Neck: Normal range of motion.  Cardiovascular: Regular rhythm and normal heart sounds.  Respiratory: Effort normal. No respiratory distress.  GI: Soft.  Musculoskeletal: Normal range of motion.  Neurological: He is alert.  Skin: Skin is warm and dry.  Psychiatric: Judgment normal. His affect is blunt. His speech is delayed. He is slowed. Thought content is not paranoid. Cognition and memory are normal. He expresses no homicidal and no suicidal ideation.    Review of Systems  Constitutional: Negative.   HENT: Negative.   Eyes: Negative.   Respiratory: Negative.   Cardiovascular: Negative.   Gastrointestinal: Negative.   Musculoskeletal: Negative.   Skin: Negative.   Neurological: Negative.   Psychiatric/Behavioral: Positive for depression. Negative for hallucinations, memory loss, substance abuse and suicidal ideas. The patient is nervous/anxious and has insomnia.     Blood pressure 114/74, pulse 77, temperature 98.2 F (36.8 C), temperature source Oral, resp. rate 18, height 5\' 1"  (1.549 m), weight 46.3 kg, SpO2 100 %.Body mass index is 19.27 kg/m.  General Appearance: Casual  Eye Contact:  Fair  Speech:  Slow  Volume:  Decreased  Mood:  Depressed  Affect:  Constricted  Thought Process:  Goal Directed  Orientation:  Full (Time, Place, and Person)  Thought Content:  Logical  Suicidal Thoughts:  No  Homicidal Thoughts:  No  Memory:  Immediate;   Fair Recent;   Fair Remote;   Fair  Judgement:  Fair  Insight:  Fair  Psychomotor Activity:  Decreased  Concentration:  Concentration: Fair  Recall:  [employer] of Knowledge:  Fair  Language:  Fair  Akathisia:  No  Handed:  Right  AIMS (if indicated):     Assets:  Desire for Improvement  ADL's:  Impaired  Cognition:  WNL  Sleep:  Number of Hours: 7.5      COGNITIVE FEATURES THAT CONTRIBUTE TO RISK:  Polarized thinking    SUICIDE RISK:   Minimal: No identifiable suicidal ideation.   Patients presenting with no risk factors but with morbid ruminations; may be classified as minimal risk based on the severity of the depressive symptoms  PLAN OF CARE: Patient admitted to the psychiatric service.  15-minute checks in place.  We will focus on trying to find him a place to live since that is the primary issue here.  Patient suicidality will be reassessed regularly before discharge.  He has been started on antidepressant medicine.  I certify that inpatient services furnished can reasonably be expected to improve the patient's condition.   Alethia Berthold, MD 01/08/2019, 3:36 PM

## 2019-01-08 NOTE — Progress Notes (Signed)
Recreation Therapy Notes  Date: 01/08/2019  Time: 9:30 am   Location: Craft room   Behavioral response: N/A   Intervention Topic: Happiness  Discussion/Intervention: Patient did not attend group.   Clinical Observations/Feedback:  Patient did not attend group.   Wendy Mikles LRT/CTRS        Samira Acero 01/08/2019 10:20 AM

## 2019-01-09 NOTE — Plan of Care (Signed)
  Problem: Coping: Goal: Will verbalize feelings Outcome: Progressing  Patient verbalized feelings to staff.

## 2019-01-09 NOTE — Progress Notes (Signed)
Recreation Therapy Notes  INPATIENT RECREATION THERAPY ASSESSMENT  Patient Details Name: Brett Small MRN: [id number] DOB: April 28, 1976 Today's Date: 01/09/2019       Information Obtained From: Patient  Able to Participate in Assessment/Interview: Yes  Patient Presentation: Responsive  Reason for Admission (Per Patient): Active Symptoms, Other (Comments)(Depression)  Patient Stressors: Other (Comment)(Homeless)  Coping Skills:   Other (Comment)(Sleep)  Leisure Interests (2+):  Music - Play instrument, Music - Listen  Frequency of Recreation/Participation: Monthly  Awareness of Community Resources:     Community Resources:     Current Use:    If no, Barriers?:    Expressed Interest in [place] of Residence:  Homeless  Patient Main Form of Transportation: Walk  Patient Strengths:  Easy going, laid back  Patient Identified Areas of Improvement:  N/A  Patient Goal for Hospitalization:  Find a place to live and be independent  Current SI (including self-harm):  No  Current HI:  No  Current AVH: No  Staff Intervention Plan: Group Attendance, Collaborate with Interdisciplinary Treatment Team  Consent to Intern Participation: N/A  Karne Ozga 01/09/2019, 1:15 PM

## 2019-01-09 NOTE — [facility] Group Notes (Signed)
Feelings Around Relapse 01/09/2019 1PM  Type of Therapy and Topic:  Group Therapy:  Feelings around Relapse and Recovery  Participation Level:  Did Not Attend   Description of Group:    Patients in this group will discuss emotions they experience before and after a relapse. They will process how experiencing these feelings, or avoidance of experiencing them, relates to having a relapse. Facilitator will guide patients to explore emotions they have related to recovery. Patients will be encouraged to process which emotions are more powerful. They will be guided to discuss the emotional reaction significant others in their lives may have to patients' relapse or recovery. Patients will be assisted in exploring ways to respond to the emotions of others without this contributing to a relapse.  Therapeutic Goals: 1. Patient will identify two or more emotions that lead to a relapse for them 2. Patient will identify two emotions that result when they relapse 3. Patient will identify two emotions related to recovery 4. Patient will demonstrate ability to communicate their needs through discussion and/or role plays   Summary of Patient Progress:     Therapeutic Modalities:   Cognitive Behavioral Therapy Solution-Focused Therapy Assertiveness Training Relapse Prevention Therapy   Yvette Rack, LCSW 01/09/2019 2:08 PM

## 2019-01-09 NOTE — Progress Notes (Signed)
Patient back from dialysis, no distress or confusion noted, VS WNL will continue to monitor closely.

## 2019-01-09 NOTE — Progress Notes (Signed)
Recreation Therapy Notes  Date: 01/09/2019  Time: 9:30 am   Location: Craft room   Behavioral response: N/A   Intervention Topic: Communication  Discussion/Intervention: Patient did not attend group.   Clinical Observations/Feedback:  Patient did not attend group.   Lilton Pare LRT/CTRS        Giavonna Pflum 01/09/2019 10:21 AM

## 2019-01-09 NOTE — Plan of Care (Signed)
Patient is alert and oriented  X 4, denies SI, HI and AVH. Patient is pleasant only complains of foot pain intermittently but the Neurontin manages pain. Patient is pleasant and cooperative. BP medication this morning was held due to low BP. There has not been any self harming behaviors. Problem: Education: Goal: Knowledge of [facility] Education information/materials will improve Outcome: Progressing Goal: Emotional status will improve Outcome: Progressing Goal: Mental status will improve Outcome: Progressing Goal: Verbalization of understanding the information provided will improve Outcome: Progressing   Problem: Safety: Goal: Periods of time without injury will increase Outcome: Progressing   Problem: Education: Goal: Utilization of techniques to improve thought processes will improve Outcome: Progressing Goal: Knowledge of the prescribed therapeutic regimen will improve Outcome: Progressing

## 2019-01-09 NOTE — Progress Notes (Signed)
Patient alert and oriented x 4, denies SI/HI/AVH, appears sad and withdrawn, he stated " l am worried what do l do next " writer asked what's bothering him and he stated " l am homeless" patient is receptive to staff, and he was given information on how social worker can help him in finding an accomodation or boarding house. Patient is complaint with medication,15 minutes safety checks maintained will continue to monitor.

## 2019-01-09 NOTE — Progress Notes (Signed)
[facility] MD Progress Note  01/09/2019 1:26 PM Brett Small  MRN:  [id number] Subjective: Patient seen.  Chart reviewed.  Patient with depression and multiple severe problems both medical and social.  Patient today continues to say that his mood is feeling a little down although mostly he feels tired.  He did get up and eat breakfast but otherwise mostly stays in bed.  He continues to deny any suicidal ideation intent or plan and does not appear to have psychotic symptoms.  Patient seems very passive about discharge however.  He tells me that his father told him that he "could not afford" for him to stay there.  Speaking with social work this morning it sounds like this may not be completely accurate.  Patient may be exaggerating the problems with going back to his father.  He saw the nephrologist yesterday and they will be taking care of his dialysis needs in the hospital but we certainly will need to make sure that he has a dialysis plan at discharge.  Encourage patient to get up out of bed. Principal Problem: Depression Diagnosis: Principal Problem:   Depression Active Problems:   End stage renal disease ([place])   Hypertension   Homelessness  Total Time spent with patient: 30 minutes  Past Psychiatric History: No significant past psychiatric history  Past Medical History:  Past Medical History:  Diagnosis Date  . Hypertension   . Renal disorder     Past Surgical History:  Procedure Laterality Date  . AORTA - FEMORAL ARTERY BYPASS GRAFT    . AV FISTULA PLACEMENT Left 12/26/2018   Procedure: INSERTION OF GORE STRETCH VASCULAR 4-7MM X  45CM IN LEFT UPPER ARM;  Surgeon: Marty Heck, MD;  Location: [facility] OR;  Service: Vascular;  Laterality: Left;   Family History:  Family History  Problem Relation Age of Onset  . Hypertension Other   . Diabetes Other   . Clotting disorder Father    Family Psychiatric  History: See previous none reported Social History:  Social History   Substance  and Sexual Activity  Alcohol Use Not Currently     Social History   Substance and Sexual Activity  Drug Use Never    Social History   Socioeconomic History  . Marital status: Single    Spouse name: Not on file  . Number of children: Not on file  . Years of education: Not on file  . Highest education level: Not on file  Occupational History  . Not on file  Social Needs  . Financial resource strain: Not on file  . Food insecurity:    Worry: Not on file    Inability: Not on file  . Transportation needs:    Medical: Not on file    Non-medical: Not on file  Tobacco Use  . Smoking status: Former Smoker    Last attempt to quit: 06/26/2018    Years since quitting: 0.5  . Smokeless tobacco: Never Used  . Tobacco comment: smoked for 30 years   Substance and Sexual Activity  . Alcohol use: Not Currently  . Drug use: Never  . Sexual activity: Not on file  Lifestyle  . Physical activity:    Days per week: Not on file    Minutes per session: Not on file  . Stress: Not on file  Relationships  . Social connections:    Talks on phone: Not on file    Gets together: Not on file    Attends religious service: Not on  file    Active member of club or organization: Not on file    Attends meetings of clubs or organizations: Not on file    Relationship status: Not on file  Other Topics Concern  . Not on file  Social History Narrative  . Not on file   Additional Social History:                         Sleep: Fair  Appetite:  Fair  Current Medications: Current Facility-Administered Medications  Medication Dose Route Frequency Provider Last Rate Last Dose  . 0.9 %  sodium chloride infusion  100 mL Intravenous PRN Lateef, Munsoor, MD      . 0.9 %  sodium chloride infusion  100 mL Intravenous PRN Lateef, Munsoor, MD      . acetaminophen (TYLENOL) tablet 650 mg  650 mg Oral Q6H PRN O'Neal, Sarita, MD      . alteplase (CATHFLO ACTIVASE) injection 2 mg  2 mg Intracatheter  Once PRN Lateef, Munsoor, MD      . amLODipine (NORVASC) tablet 5 mg  5 mg Oral Daily Tennis Ship, MD   Stopped at 01/08/19 0759  . apixaban (ELIQUIS) tablet 2.5 mg  2.5 mg Oral BID Tennis Ship, MD   2.5 mg at 01/09/19 0955  . aspirin EC tablet 81 mg  81 mg Oral Daily Tennis Ship, MD   81 mg at 01/09/19 0950  . atorvastatin (LIPITOR) tablet 80 mg  80 mg Oral QHS Tennis Ship, MD   80 mg at 01/08/19 2100  . carvedilol (COREG) tablet 25 mg  25 mg Oral BID WC Tennis Ship, MD   25 mg at 01/09/19 0951  . Chlorhexidine Gluconate Cloth 2 % PADS 6 each  6 each Topical Q0600 Lateef, Munsoor, MD      . escitalopram (LEXAPRO) tablet 10 mg  10 mg Oral Daily Tennis Ship, MD   10 mg at 01/09/19 0951  . gabapentin (NEURONTIN) capsule 300 mg  300 mg Oral TID Tennis Ship, MD   300 mg at 01/09/19 0959  . heparin injection 1,000 Units  1,000 Units Dialysis PRN Holley Raring, Munsoor, MD      . heparin injection 2,000 Units  2,000 Units Dialysis PRN Roney Jaffe, MD      . hydrALAZINE (APRESOLINE) tablet 25 mg  25 mg Oral Q8H O'Neal, Fredirick Maudlin, MD   25 mg at 01/09/19 8502  . hydrOXYzine (ATARAX/VISTARIL) tablet 25 mg  25 mg Oral TID PRN Tennis Ship, MD   25 mg at 01/09/19 0034  . lidocaine (PF) (XYLOCAINE) 1 % injection 5 mL  5 mL Intradermal PRN Lateef, Munsoor, MD      . lidocaine-prilocaine (EMLA) cream 1 application  1 application Topical PRN Lateef, Munsoor, MD      . multivitamin (RENA-VIT) tablet 1 tablet  1 tablet Oral Daily Tennis Ship, MD   1 tablet at 01/09/19 0953  . pentafluoroprop-tetrafluoroeth (GEBAUERS) aerosol 1 application  1 application Topical PRN Lateef, Munsoor, MD      . sevelamer carbonate (RENVELA) tablet 1,600 mg  1,600 mg Oral TID WC Tennis Ship, MD   1,600 mg at 01/09/19 7741    Lab Results:  Results for orders placed or performed during the hospital encounter of 01/07/19 (from the past 48 hour(s))  Hemoglobin A1c     Status: None   Collection Time: 01/08/19   4:11 PM  Result Value Ref Range   Hgb A1c MFr Bld 5.5  4.8 - 5.6 %    Comment: (NOTE) Pre diabetes:          5.7%-6.4% Diabetes:              >6.4% Glycemic control for   <7.0% adults with diabetes    Mean Plasma Glucose 111.15 mg/dL    Comment: Performed at [facility], [place] [address], [place], [place] [address]  Lipid panel     Status: Abnormal   Collection Time: 01/08/19  4:11 PM  Result Value Ref Range   Cholesterol 101 0 - 200 mg/dL   Triglycerides 156 (H) <150 mg/dL   HDL 33 (L) >40 mg/dL   Total CHOL/HDL Ratio 3.1 RATIO   VLDL 31 0 - 40 mg/dL   LDL Cholesterol 37 0 - 99 mg/dL    Comment:        Total Cholesterol/HDL:CHD Risk Coronary Heart Disease Risk Table                     Men   Women  1/2 Average Risk   3.4   3.3  Average Risk       5.0   4.4  2 X Average Risk   9.6   7.1  3 X Average Risk  23.4   11.0        Use the calculated Patient Ratio above and the CHD Risk Table to determine the patient's CHD Risk.        ATP III CLASSIFICATION (LDL):  <100     mg/dL   Optimal  100-129  mg/dL   Near or Above                    Optimal  130-159  mg/dL   Borderline  160-189  mg/dL   High  >190     mg/dL   Very High Performed at [facility], [address] [place], [place] [address]   TSH     Status: None   Collection Time: 01/08/19  4:11 PM  Result Value Ref Range   TSH 2.193 0.350 - 4.500 uIU/mL    Comment: Performed by a 3rd Generation assay with a functional sensitivity of <=0.01 uIU/mL. Performed at [facility], [address] [place], [place] [address]   CBC     Status: Abnormal   Collection Time: 01/08/19  4:11 PM  Result Value Ref Range   WBC 7.6 4.0 - 10.5 K/uL   RBC 3.11 (L) 4.22 - 5.81 MIL/uL   Hemoglobin 9.0 (L) 13.0 - 17.0 g/dL   HCT 27.4 (L) 39.0 - 52.0 %   MCV 88.1 80.0 - 100.0 fL   MCH 28.9 26.0 - 34.0 pg   MCHC 32.8 30.0 - 36.0 g/dL   RDW 13.8 11.5 - 15.5 %   Platelets 186 150 - 400 K/uL   nRBC 0.0 0.0 - 0.2  %    Comment: Performed at [facility], [address], [place], [place] [address]  Phosphorus     Status: Abnormal   Collection Time: 01/08/19  4:11 PM  Result Value Ref Range   Phosphorus 6.5 (H) 2.5 - 4.6 mg/dL    Comment: Performed at [facility], [address] [place], [place] [address]    Blood Alcohol level:  No results found for: [facility]  Metabolic Disorder Labs: Lab Results  Component Value Date   HGBA1C 5.5 01/08/2019   MPG 111.15 01/08/2019   No results found  for: PROLACTIN Lab Results  Component Value Date   CHOL 101 01/08/2019   TRIG 156 (H) 01/08/2019   HDL 33 (L) 01/08/2019   CHOLHDL 3.1 01/08/2019   VLDL 31 01/08/2019   LDLCALC 37 01/08/2019    Physical Findings: AIMS:  , ,  ,  ,    CIWA:    COWS:     Musculoskeletal: Strength & Muscle Tone: within normal limits Gait & Station: normal Patient leans: N/A  Psychiatric Specialty Exam: Physical Exam  Nursing note and vitals reviewed. Constitutional: He appears well-developed and well-nourished.  HENT:  Head: Normocephalic and atraumatic.  Eyes: Pupils are equal, round, and reactive to light. Conjunctivae are normal.  Neck: Normal range of motion.  Cardiovascular: Regular rhythm and normal heart sounds.  Respiratory: Effort normal. No respiratory distress.  GI: Soft.  Musculoskeletal: Normal range of motion.  Neurological: He is alert.  Skin: Skin is warm and dry.  Psychiatric: His affect is blunt. His speech is delayed. He is slowed. Cognition and memory are impaired. He expresses inappropriate judgment. He expresses no homicidal and no suicidal ideation.    Review of Systems  Constitutional: Positive for malaise/fatigue.  HENT: Negative.   Eyes: Negative.   Respiratory: Negative.   Cardiovascular: Negative.   Gastrointestinal: Negative.   Musculoskeletal: Negative.   Skin: Negative.   Neurological: Negative.   Psychiatric/Behavioral: Positive for depression. Negative  for hallucinations, memory loss, substance abuse and suicidal ideas. The patient is not nervous/anxious and does not have insomnia.     Blood pressure 109/71, pulse 75, temperature 98.4 F (36.9 C), temperature source Oral, resp. rate 16, height 5\' 1"  (1.549 m), weight 45.3 kg, SpO2 100 %.Body mass index is 18.87 kg/m.  General Appearance: Disheveled  Eye Contact:  Minimal  Speech:  Slow  Volume:  Decreased  Mood:  Dysphoric  Affect:  Depressed  Thought Process:  Coherent  Orientation:  Full (Time, Place, and Person)  Thought Content:  Logical  Suicidal Thoughts:  No  Homicidal Thoughts:  No  Memory:  Immediate;   Fair Recent;   Fair Remote;   Fair  Judgement:  Fair  Insight:  Fair  Psychomotor Activity:  Decreased  Concentration:  Concentration: Poor  Recall:  Poor  Fund of Knowledge:  Fair  Language:  Fair  Akathisia:  No  Handed:  Right  AIMS (if indicated):     Assets:  Desire for Improvement  ADL's:  Impaired  Cognition:  Impaired,  Mild  Sleep:  Number of Hours: 4.75     Treatment Plan Summary: Daily contact with patient to assess and evaluate symptoms and progress in treatment, Medication management and Plan Patient with major depression.  Currently on Lexapro but just starting treatment.  Tried to do some psychoeducation and encouragement to the patient but he is very flat and withdrawn.  He tells me he thinks the gabapentin is making him tired although he also says it is necessary to help with his chronic pain.  Spoke with treatment team today.  It would be ideal if we could find a way to get him discharged to some kind of safe living situation but he is being very passive about it.  Meanwhile continue current medicine and involvement on the unit.  Alethia Berthold, MD 01/09/2019, 1:26 PM

## 2019-01-09 NOTE — Tx Team (Addendum)
Interdisciplinary Treatment and Diagnostic Plan Update  01/09/2019 Time of Session: 2:30pm  Brett Small MRN: [id number]  Principal Diagnosis: Depression  Secondary Diagnoses: Principal Problem:   Depression Active Problems:   End stage renal disease ([place])   Hypertension   Homelessness   Current Medications:  Current Facility-Administered Medications  Medication Dose Route Frequency Provider Last Rate Last Dose  . 0.9 %  sodium chloride infusion  100 mL Intravenous PRN Lateef, Munsoor, MD      . 0.9 %  sodium chloride infusion  100 mL Intravenous PRN Lateef, Munsoor, MD      . acetaminophen (TYLENOL) tablet 650 mg  650 mg Oral Q6H PRN O'Neal, Sarita, MD      . alteplase (CATHFLO ACTIVASE) injection 2 mg  2 mg Intracatheter Once PRN Lateef, Munsoor, MD      . amLODipine (NORVASC) tablet 5 mg  5 mg Oral Daily Tennis Ship, MD   Stopped at 01/08/19 0759  . apixaban (ELIQUIS) tablet 2.5 mg  2.5 mg Oral BID Tennis Ship, MD   2.5 mg at 01/09/19 0955  . aspirin EC tablet 81 mg  81 mg Oral Daily Tennis Ship, MD   81 mg at 01/09/19 0950  . atorvastatin (LIPITOR) tablet 80 mg  80 mg Oral QHS Tennis Ship, MD   80 mg at 01/08/19 2100  . carvedilol (COREG) tablet 25 mg  25 mg Oral BID WC Tennis Ship, MD   25 mg at 01/09/19 0951  . Chlorhexidine Gluconate Cloth 2 % PADS 6 each  6 each Topical Q0600 Lateef, Munsoor, MD      . escitalopram (LEXAPRO) tablet 10 mg  10 mg Oral Daily Tennis Ship, MD   10 mg at 01/09/19 0951  . gabapentin (NEURONTIN) capsule 300 mg  300 mg Oral TID Tennis Ship, MD   300 mg at 01/09/19 0959  . heparin injection 1,000 Units  1,000 Units Dialysis PRN Holley Raring, Munsoor, MD      . heparin injection 2,000 Units  2,000 Units Dialysis PRN Roney Jaffe, MD      . hydrALAZINE (APRESOLINE) tablet 25 mg  25 mg Oral Q8H O'Neal, Fredirick Maudlin, MD   25 mg at 01/09/19 1324  . hydrOXYzine (ATARAX/VISTARIL) tablet 25 mg  25 mg Oral TID PRN Tennis Ship, MD   25 mg at  01/09/19 0034  . lidocaine (PF) (XYLOCAINE) 1 % injection 5 mL  5 mL Intradermal PRN Lateef, Munsoor, MD      . lidocaine-prilocaine (EMLA) cream 1 application  1 application Topical PRN Lateef, Munsoor, MD      . multivitamin (RENA-VIT) tablet 1 tablet  1 tablet Oral Daily Tennis Ship, MD   1 tablet at 01/09/19 0953  . pentafluoroprop-tetrafluoroeth (GEBAUERS) aerosol 1 application  1 application Topical PRN Lateef, Munsoor, MD      . sevelamer carbonate (RENVELA) tablet 1,600 mg  1,600 mg Oral TID WC Tennis Ship, MD   1,600 mg at 01/09/19 0950   PTA Medications: Medications Prior to Admission  Medication Sig Dispense Refill Last Dose  . acetaminophen (TYLENOL) 325 MG tablet Take 650 mg by mouth every 6 (six) hours as needed for mild pain or headache.   Past Week at Unknown time  . amLODipine (NORVASC) 5 MG tablet Take 5 mg by mouth daily. hold if Systolic reading is <401   01/07/2019 at Unknown time  . apixaban (ELIQUIS) 2.5 MG TABS tablet Take 1 tablet (2.5 mg total) by mouth 2 (two) times daily. 60 tablet 0  01/07/2019 at Unknown time  . aspirin EC 81 MG tablet Take 81 mg by mouth daily.   01/07/2019 at Unknown time  . atorvastatin (LIPITOR) 80 MG tablet Take 80 mg by mouth at bedtime.   01/06/2019 at Unknown time  . B Complex-C-Folic Acid (NEPHRO-VITE PO) Take 1 tablet by mouth daily.   01/07/2019 at Unknown time  . carvedilol (COREG) 25 MG tablet Take 25 mg by mouth 2 (two) times daily with a meal.   01/07/2019 at Unknown time  . escitalopram (LEXAPRO) 10 MG tablet Take 1 tablet (10 mg total) by mouth daily. 30 tablet 1 01/07/2019 at Unknown time  . gabapentin (NEURONTIN) 300 MG capsule Take 300 mg by mouth 3 (three) times daily.   01/07/2019 at Unknown time  . hydrALAZINE (APRESOLINE) 25 MG tablet Take 25 mg by mouth every 8 (eight) hours.   01/07/2019 at Unknown time  . hydrOXYzine (ATARAX/VISTARIL) 25 MG tablet Take 25 mg by mouth 3 (three) times daily as needed for anxiety.   Past Week at  Unknown time  . sevelamer carbonate (RENVELA) 800 MG tablet Take 2 tablets (1,600 mg total) by mouth 3 (three) times daily with meals. 180 tablet 0 01/07/2019 at Unknown time    Patient Stressors: Health problems Medication change or noncompliance  Patient Strengths: Ability for insight Communication skills  Treatment Modalities: Medication Management, Group therapy, Case management,  1 to 1 session with clinician, Psychoeducation, Recreational therapy.   Physician Treatment Plan for Primary Diagnosis: Depression Long Term Goal(s): Improvement in symptoms so as ready for discharge Improvement in symptoms so as ready for discharge   Short Term Goals: Ability to verbalize feelings will improve Ability to disclose and discuss suicidal ideas Compliance with prescribed medications will improve  Medication Management: Evaluate patient's response, side effects, and tolerance of medication regimen.  Therapeutic Interventions: 1 to 1 sessions, Unit Group sessions and Medication administration.  Evaluation of Outcomes: Progressing  Physician Treatment Plan for Secondary Diagnosis: Principal Problem:   Depression Active Problems:   End stage renal disease ([place])   Hypertension   Homelessness  Long Term Goal(s): Improvement in symptoms so as ready for discharge Improvement in symptoms so as ready for discharge   Short Term Goals: Ability to verbalize feelings will improve Ability to disclose and discuss suicidal ideas Compliance with prescribed medications will improve     Medication Management: Evaluate patient's response, side effects, and tolerance of medication regimen.  Therapeutic Interventions: 1 to 1 sessions, Unit Group sessions and Medication administration.  Evaluation of Outcomes: Progressing   RN Treatment Plan for Primary Diagnosis: Depression Long Term Goal(s): Knowledge of disease and therapeutic regimen to maintain health will improve  Short Term Goals: Ability  to demonstrate self-control, Ability to participate in decision making will improve, Ability to verbalize feelings will improve and Ability to disclose and discuss suicidal ideas  Medication Management: RN will administer medications as ordered by provider, will assess and evaluate patient's response and provide education to patient for prescribed medication. RN will report any adverse and/or side effects to prescribing provider.  Therapeutic Interventions: 1 on 1 counseling sessions, Psychoeducation, Medication administration, Evaluate responses to treatment, Monitor vital signs and CBGs as ordered, Perform/monitor CIWA, COWS, AIMS and Fall Risk screenings as ordered, Perform wound care treatments as ordered.  Evaluation of Outcomes: Progressing   LCSW Treatment Plan for Primary Diagnosis: Depression Long Term Goal(s): Safe transition to appropriate next level of care at discharge, Engage patient in therapeutic group addressing interpersonal concerns.  Short Term Goals: Engage patient in aftercare planning with referrals and resources, Increase social support, Increase ability to appropriately verbalize feelings, Increase emotional regulation and Facilitate acceptance of mental health diagnosis and concerns  Therapeutic Interventions: Assess for all discharge needs, 1 to 1 time with Social worker, Explore available resources and support systems, Assess for adequacy in community support network, Educate family and significant other(s) on suicide prevention, Complete Psychosocial Assessment, Interpersonal group therapy.  Evaluation of Outcomes: Progressing   Progress in Treatment: Attending groups: No. Participating in groups: No. Taking medication as prescribed: Yes. Toleration medication: Yes. Family/Significant other contact made: Yes, individual(s) contacted:  SPE completed with the patient's father.  Patient understands diagnosis: Yes. Discussing patient identified problems/goals with  staff: Yes. Medical problems stabilized or resolved: Yes. Denies suicidal/homicidal ideation: Yes. Issues/concerns per patient self-inventory: No. Other: none  New problem(s) identified: No, Describe:  none  New Short Term/Long Term Goal(s): elimination of symptoms of psychosis, medication management for mood stabilization; elimination of SI thoughts; development of comprehensive mental wellness  Patient Goals: "if I had a place to live I'd be ok"  Discharge Plan or Barriers: Pt is currently homeless.  Treatment team encouraged patient to reach out to his father in terms of a place to live.  CSW provided the patient father's number.   Reason for Continuation of Hospitalization: Anxiety Depression Medical Issues Medication stabilization Suicidal ideation  Estimated Length of Stay: 1-5 days  Recreational Therapy: Patient Stressors: N/A Patient Goal: Patient will engage in groups without prompting or encouragement from LRT x3 group sessions within 5 recreation therapy group sessions  Attendees: Patient: Brett Small 01/09/2019 3:09 PM  Physician: Dr. Weber Cooks, MD 01/09/2019 3:09 PM  Nursing: Jerry Caras, RN 01/09/2019 3:09 PM  RN Care Manager: 01/09/2019 3:09 PM  Social Worker: [name], LCSW 01/09/2019 3:09 PM  Recreational Therapist: Roanna Epley, Reather Converse, LRT 01/09/2019 3:09 PM  Other:  01/09/2019 3:09 PM  Other:  01/09/2019 3:09 PM  Other: 01/09/2019 3:09 PM    Scribe for Treatment Team: Rozann Lesches, LCSW 01/09/2019 3:09 PM

## 2019-01-10 LAB — PARATHYROID HORMONE, INTACT (NO CA): PTH: 92 pg/mL — ABNORMAL HIGH (ref 15–65)

## 2019-01-10 MED ORDER — EPOETIN ALFA 4000 UNIT/ML IJ SOLN: 4000.0000 [IU] | INTRAMUSCULAR | Status: DC

## 2019-01-10 MED ORDER — EPOETIN ALFA 10000 UNIT/ML IJ SOLN
10000.0000 [IU] | INTRAMUSCULAR | Status: DC
  Administered 2019-01-10 – 2019-01-24 (×8): 10000 [IU] via INTRAVENOUS

## 2019-01-10 MED ORDER — GABAPENTIN 100 MG PO CAPS
100.0000 mg | ORAL_CAPSULE | Freq: Three times a day (TID) | ORAL | Status: DC
  Administered 2019-01-10 – 2019-01-13 (×8): 100 mg via ORAL
  Filled 2019-01-10 (×8): qty 1

## 2019-01-10 NOTE — [facility] Group Notes (Signed)
LCSW Group Therapy Note   01/10/2019 1:15pm   Type of Therapy and Topic:  Group Therapy:  Trust and Honesty  Participation Level:  Did Not Attend  Description of Group:    In this group patients will be asked to explore the value of being honest.  Patients will be guided to discuss their thoughts, feelings, and behaviors related to honesty and trusting in others. Patients will process together how trust and honesty relate to forming relationships with peers, family members, and self. Each patient will be challenged to identify and express feelings of being vulnerable. Patients will discuss reasons why people are dishonest and identify alternative outcomes if one was truthful (to self or others). This group will be process-oriented, with patients participating in exploration of their own experiences, giving and receiving support, and processing challenge from other group members.   Therapeutic Goals: 1. Patient will identify why honesty is important to relationships and how honesty overall affects relationships.  2. Patient will identify a situation where they lied or were lied too and the  feelings, thought process, and behaviors surrounding the situation 3. Patient will identify the meaning of being vulnerable, how that feels, and how that correlates to being honest with self and others. 4. Patient will identify situations where they could have told the truth, but instead lied and explain reasons of dishonesty.   Summary of Patient Progress : Pt was invited to attend group but chose not to attend. CSW will continue to encourage pt to attend group throughout their admission.     Therapeutic Modalities:   Cognitive Behavioral Therapy Solution Focused Therapy Motivational Interviewing Brief Therapy  Kilie Rund  CUEBAS-COLON, LCSW 01/10/2019 12:36 PM

## 2019-01-10 NOTE — Progress Notes (Signed)
Gave report to Freida Busman, RN this morning for dialysis. Informed RN that patient is a voluntary admission and per Dr. Danella Sensing gave the ok for patient to go to dialysis without a sitter due to low staffing. Patient is not a flight risk.  Dialysis nurse said that he felt comfortable accepting patient without a sitter. Contact information given to Freida Busman, RN in the event of an emergency.

## 2019-01-10 NOTE — Plan of Care (Signed)
D: Patient complained of pain 8/10 in his right ankle.Medicated per prn order. Old dressing was soiled with yellowish drainage. Dressing changed, wound is tiny with serous non-purulent drainage. Cleaned with saline and dry gauze applied. Denies SI, HI and AV hallucinations. Out in the dayroom watching TV with his peers. Voices no other complaints. A: Continue to monitor for safety and offer support. R: Safety maintained.

## 2019-01-10 NOTE — Progress Notes (Signed)
Pre Hd 

## 2019-01-10 NOTE — Plan of Care (Signed)
  Problem: Coping: Goal: Will verbalize feelings Outcome: Progressing  Patient verbalized his feelings about his health  issues and housing situation to Probation officer.

## 2019-01-10 NOTE — Progress Notes (Signed)
Hd start 

## 2019-01-10 NOTE — Progress Notes (Signed)
[facility]  ROUNDING NOTE   Subjective:  Patient seen and evaluated during hemodialysis. Tolerating well. Patient noted to be on high-dose Neurontin.   Objective:  Vital signs in last 24 hours:  Temp:  [98.3 F (36.8 C)-98.6 F (37 C)] 98.6 F (37 C) (03/21 1015) Pulse Rate:  [66-76] 72 (03/21 1100) Resp:  [16-18] 18 (03/21 1100) BP: (110-134)/(66-78) 122/66 (03/21 1100) SpO2:  [98 %-100 %] 98 % (03/21 1100)  Weight change:  Filed Weights   01/07/19 2041 01/08/19 1520 01/08/19 1909  Weight: 46.3 kg 46.3 kg 45.3 kg    Intake/Output: I/O last 3 completed shifts: In: -  Out: 1000 [Other:1000]   Intake/Output this shift:  No intake/output data recorded.  Physical Exam: General: No acute distress  Head: Normocephalic, atraumatic. Moist oral mucosal membranes  Eyes: Anicteric  Neck: Supple, trachea midline  Lungs:  Clear to auscultation, normal effort  Heart: S1S2 no rubs  Abdomen:  Soft, nontender, bowel sounds present  Extremities: No peripheral edema.  Neurologic: Awake, alert, following commands  Skin: No lesions  Access: Left internal jugular PermCath    Basic Metabolic Panel: Recent Labs  Lab 01/06/19 0701 01/08/19 1611  NA 135  --   K 3.9  --   CL 97*  --   CO2 23  --   GLUCOSE 110*  --   BUN 58*  --   CREATININE 12.11*  --   CALCIUM 8.6*  --   PHOS 8.7* 6.5*    Liver Function Tests: Recent Labs  Lab 01/06/19 0701  ALBUMIN 3.4*   No results for input(s): LIPASE, AMYLASE in the last 168 hours. No results for input(s): AMMONIA in the last 168 hours.  CBC: Recent Labs  Lab 01/06/19 0701 01/08/19 1611  WBC 10.1 7.6  HGB 10.8* 9.0*  HCT 33.4* 27.4*  MCV 90.8 88.1  PLT 228 186    Cardiac Enzymes: No results for input(s): CKTOTAL, CKMB, CKMBINDEX, TROPONINI in the last 168 hours.  BNP: Invalid input(s): POCBNP  CBG: No results for input(s): GLUCAP in the last 168 hours.  Microbiology: Results for orders placed or  performed during the hospital encounter of 12/24/18  MRSA PCR Screening     Status: None   Collection Time: 12/27/18  1:20 PM  Result Value Ref Range Status   MRSA by PCR NEGATIVE NEGATIVE Final    Comment:        The GeneXpert MRSA Assay (FDA approved for NASAL specimens only), is one component of a comprehensive MRSA colonization surveillance program. It is not intended to diagnose MRSA infection nor to guide or monitor treatment for MRSA infections. Performed at [facility], [place] [address], [place], [place] [address]     Coagulation Studies: No results for input(s): LABPROT, INR in the last 72 hours.  Urinalysis: No results for input(s): COLORURINE, LABSPEC, PHURINE, GLUCOSEU, HGBUR, BILIRUBINUR, KETONESUR, PROTEINUR, UROBILINOGEN, NITRITE, LEUKOCYTESUR in the last 72 hours.  Invalid input(s): APPERANCEUR    Imaging: No results found.   Medications:   . sodium chloride    . sodium chloride     . amLODipine  5 mg Oral Daily  . apixaban  2.5 mg Oral BID  . aspirin EC  81 mg Oral Daily  . atorvastatin  80 mg Oral QHS  . carvedilol  25 mg Oral BID WC  . Chlorhexidine Gluconate Cloth  6 each Topical Q0600  . escitalopram  10 mg Oral Daily  . gabapentin  300 mg  Oral TID  . hydrALAZINE  25 mg Oral Q8H  . multivitamin  1 tablet Oral Daily  . sevelamer carbonate  1,600 mg Oral TID WC   sodium chloride, sodium chloride, acetaminophen, alteplase, heparin, heparin, hydrOXYzine, lidocaine (PF), lidocaine-prilocaine, pentafluoroprop-tetrafluoroeth  Assessment/ Plan:  43 y.o. male with a PMHx of ESRD on HD TTHS, hypertension, secondary hyperparathyroidism, depression, anemia chronic kidney disease,aortobifemoral bypass, who was admitted to [facility] on 01/07/2019 for evaluation and treatment of major depressive disorder.  [facility]/T[facility]S, however patient had not formally started dialysis there yet.  Patient currently homeless.   1.  ESRD on HD TTHS:   Patient  seen and evaluated during hemodialysis.  Tolerating well.  We plan to complete dialysis treatment today and thereafter next treatment will be on Tuesday.   Patient noted to be on high dosage gabapentin.  We will reduce this to 100 mg p.o. 3 times daily.  2.  Anemia chronic kidney disease.    Hemoglobin down to 9.0.  We will start the patient on Epogen.  3.  Secondary hyperparathyroidism.    Continue Renvela 2 tablets p.o. 3 times daily with meals.  4.  Hypertension.  Continue amlodipine, carvedilol, hydralazine.   LOS: 3 Jeanni Allshouse 3/21/202011:38 AM

## 2019-01-10 NOTE — Progress Notes (Signed)
D: Patient complained of pain 8/10 in his right ankle.Medicated per prn order. Old dressing was soiled with yellowish drainage. Dressing changed, wound is tiny with serous non-purulent drainage. Cleaned with saline and dry gauze applied. Denies SI, HI and AV hallucinations. Out in the dayroom watching TV with his peers. Voices no other complaints. A: Continue to monitor for safety and offer support. R: Safety maintained.

## 2019-01-10 NOTE — Progress Notes (Signed)
[facility] MD Progress Note  01/10/2019 11:58 AM Brett Small  MRN:  [id number] Subjective:   Patient is a 43yo M with depression and multiple medical comorbid conditions and social problems.    Today patient identifies his mood as "okay, better". Continues to complaint about feeling tired; he continues to stay mostly in bed. He denies any suicidal ideations and plans. Denies homicidal thoughts as well. He denies any hallucinations, does not express any delusions. He denies any side effects from his psychotropic medications.  He will have his dialysis session today. He is willing to maintain his grooming and is asking about beard trimmers. We discussed with nurse that cannot get trimmer in the psych unit, patient expressed understanding. Patient is anxious about his future discharge and is willing to meet with a social worker on Monday to discuss options.   Principal Problem: Depression Diagnosis: Principal Problem:   Depression Active Problems:   End stage renal disease ([place])   Hypertension   Homelessness  Total Time spent with patient: 30 minutes  Past Psychiatric History: No significant past psychiatric history  Past Medical History:  Past Medical History:  Diagnosis Date  . Hypertension   . Renal disorder     Past Surgical History:  Procedure Laterality Date  . AORTA - FEMORAL ARTERY BYPASS GRAFT    . AV FISTULA PLACEMENT Left 12/26/2018   Procedure: INSERTION OF GORE STRETCH VASCULAR 4-7MM X  45CM IN LEFT UPPER ARM;  Surgeon: Marty Heck, MD;  Location: [facility] OR;  Service: Vascular;  Laterality: Left;   Family History:  Family History  Problem Relation Age of Onset  . Hypertension Other   . Diabetes Other   . Clotting disorder Father    Family Psychiatric  History: No pertinent psych FH  Social History:  Social History   Substance and Sexual Activity  Alcohol Use Not Currently     Social History   Substance and Sexual Activity  Drug Use Never    Social  History   Socioeconomic History  . Marital status: Single    Spouse name: Not on file  . Number of children: Not on file  . Years of education: Not on file  . Highest education level: Not on file  Occupational History  . Not on file  Social Needs  . Financial resource strain: Not on file  . Food insecurity:    Worry: Not on file    Inability: Not on file  . Transportation needs:    Medical: Not on file    Non-medical: Not on file  Tobacco Use  . Smoking status: Former Smoker    Last attempt to quit: 06/26/2018    Years since quitting: 0.5  . Smokeless tobacco: Never Used  . Tobacco comment: smoked for 30 years   Substance and Sexual Activity  . Alcohol use: Not Currently  . Drug use: Never  . Sexual activity: Not on file  Lifestyle  . Physical activity:    Days per week: Not on file    Minutes per session: Not on file  . Stress: Not on file  Relationships  . Social connections:    Talks on phone: Not on file    Gets together: Not on file    Attends religious service: Not on file    Active member of club or organization: Not on file    Attends meetings of clubs or organizations: Not on file    Relationship status: Not on file  Other Topics Concern  .  Not on file  Social History Narrative  . Not on file   Additional Social History: N/A                        Sleep: Fair  Appetite:  Fair  Current Medications: Current Facility-Administered Medications  Medication Dose Route Frequency Provider Last Rate Last Dose  . 0.9 %  sodium chloride infusion  100 mL Intravenous PRN Lateef, Munsoor, MD      . 0.9 %  sodium chloride infusion  100 mL Intravenous PRN Lateef, Munsoor, MD      . acetaminophen (TYLENOL) tablet 650 mg  650 mg Oral Q6H PRN O'Neal, Sarita, MD      . alteplase (CATHFLO ACTIVASE) injection 2 mg  2 mg Intracatheter Once PRN Lateef, Munsoor, MD      . amLODipine (NORVASC) tablet 5 mg  5 mg Oral Daily Tennis Ship, MD   5 mg at 01/10/19 0756   . apixaban (ELIQUIS) tablet 2.5 mg  2.5 mg Oral BID Tennis Ship, MD   2.5 mg at 01/10/19 0756  . aspirin EC tablet 81 mg  81 mg Oral Daily Tennis Ship, MD   81 mg at 01/10/19 0756  . atorvastatin (LIPITOR) tablet 80 mg  80 mg Oral QHS Tennis Ship, MD   80 mg at 01/09/19 2233  . carvedilol (COREG) tablet 25 mg  25 mg Oral BID WC Tennis Ship, MD   25 mg at 01/10/19 0757  . Chlorhexidine Gluconate Cloth 2 % PADS 6 each  6 each Topical Q0600 Lateef, Munsoor, MD      . epoetin alfa (EPOGEN,PROCRIT) injection 10,000 Units  10,000 Units Intravenous Q T,Th,Sa-HD Holley Raring, Munsoor, MD   10,000 Units at 01/10/19 1147  . escitalopram (LEXAPRO) tablet 10 mg  10 mg Oral Daily Tennis Ship, MD   10 mg at 01/10/19 0756  . gabapentin (NEURONTIN) capsule 100 mg  100 mg Oral TID Lateef, Munsoor, MD      . heparin injection 1,000 Units  1,000 Units Dialysis PRN Lateef, Munsoor, MD      . heparin injection 2,000 Units  2,000 Units Dialysis PRN Roney Jaffe, MD      . hydrALAZINE (APRESOLINE) tablet 25 mg  25 mg Oral Q8H O'Neal, Sarita, MD   25 mg at 01/10/19 2694  . hydrOXYzine (ATARAX/VISTARIL) tablet 25 mg  25 mg Oral TID PRN Tennis Ship, MD   25 mg at 01/09/19 0034  . lidocaine (PF) (XYLOCAINE) 1 % injection 5 mL  5 mL Intradermal PRN Lateef, Munsoor, MD      . lidocaine-prilocaine (EMLA) cream 1 application  1 application Topical PRN Lateef, Munsoor, MD      . multivitamin (RENA-VIT) tablet 1 tablet  1 tablet Oral Daily Tennis Ship, MD   1 tablet at 01/10/19 0756  . pentafluoroprop-tetrafluoroeth (GEBAUERS) aerosol 1 application  1 application Topical PRN Lateef, Munsoor, MD      . sevelamer carbonate (RENVELA) tablet 1,600 mg  1,600 mg Oral TID WC Tennis Ship, MD   1,600 mg at 01/10/19 0756    Lab Results:  Results for orders placed or performed during the hospital encounter of 01/07/19 (from the past 48 hour(s))  Hemoglobin A1c     Status: None   Collection Time: 01/08/19  4:11 PM   Result Value Ref Range   Hgb A1c MFr Bld 5.5 4.8 - 5.6 %    Comment: (NOTE) Pre diabetes:  5.7%-6.4% Diabetes:              >6.4% Glycemic control for   <7.0% adults with diabetes    Mean Plasma Glucose 111.15 mg/dL    Comment: Performed at [facility] [address], [place], [place] [address]  Lipid panel     Status: Abnormal   Collection Time: 01/08/19  4:11 PM  Result Value Ref Range   Cholesterol 101 0 - 200 mg/dL   Triglycerides 156 (H) <150 mg/dL   HDL 33 (L) >40 mg/dL   Total CHOL/HDL Ratio 3.1 RATIO   VLDL 31 0 - 40 mg/dL   LDL Cholesterol 37 0 - 99 mg/dL    Comment:        Total Cholesterol/HDL:CHD Risk Coronary Heart Disease Risk Table                     Men   Women  1/2 Average Risk   3.4   3.3  Average Risk       5.0   4.4  2 X Average Risk   9.6   7.1  3 X Average Risk  23.4   11.0        Use the calculated Patient Ratio above and the CHD Risk Table to determine the patient's CHD Risk.        ATP III CLASSIFICATION (LDL):  <100     mg/dL   Optimal  100-129  mg/dL   Near or Above                    Optimal  130-159  mg/dL   Borderline  160-189  mg/dL   High  >190     mg/dL   Very High Performed at [facility], [address] [place], [place] [address]   TSH     Status: None   Collection Time: 01/08/19  4:11 PM  Result Value Ref Range   TSH 2.193 0.350 - 4.500 uIU/mL    Comment: Performed by a 3rd Generation assay with a functional sensitivity of <=0.01 uIU/mL. Performed at [facility], [address] [place], [place] [address]   CBC     Status: Abnormal   Collection Time: 01/08/19  4:11 PM  Result Value Ref Range   WBC 7.6 4.0 - 10.5 K/uL   RBC 3.11 (L) 4.22 - 5.81 MIL/uL   Hemoglobin 9.0 (L) 13.0 - 17.0 g/dL   HCT 27.4 (L) 39.0 - 52.0 %   MCV 88.1 80.0 - 100.0 fL   MCH 28.9 26.0 - 34.0 pg   MCHC 32.8 30.0 - 36.0 g/dL   RDW 13.8 11.5 - 15.5 %   Platelets 186 150 - 400 K/uL   nRBC 0.0 0.0 - 0.2 %     Comment: Performed at [facility], [address] [place], [place] [address]  Phosphorus     Status: Abnormal   Collection Time: 01/08/19  4:11 PM  Result Value Ref Range   Phosphorus 6.5 (H) 2.5 - 4.6 mg/dL    Comment: Performed at [facility], [address] [place], [place] [address]  Parathyroid hormone, intact (no Ca)     Status: Abnormal   Collection Time: 01/08/19  4:11 PM  Result Value Ref Range   PTH 92 (H) 15 - 65 pg/mL    Comment: (NOTE) Performed At: [facility] [place], [place] [id number] Rush Farmer MD XN:[id number]  Blood Alcohol level:  No results found for: [facility]  Metabolic Disorder Labs: Lab Results  Component Value Date   HGBA1C 5.5 01/08/2019   MPG 111.15 01/08/2019   No results found for: PROLACTIN Lab Results  Component Value Date   CHOL 101 01/08/2019   TRIG 156 (H) 01/08/2019   HDL 33 (L) 01/08/2019   CHOLHDL 3.1 01/08/2019   VLDL 31 01/08/2019   LDLCALC 37 01/08/2019    Physical Findings: AIMS:  , ,  ,  ,    CIWA:    COWS:     Musculoskeletal: Strength & Muscle Tone: within normal limits Gait & Station: normal Patient leans: N/A  Psychiatric Specialty Exam: Physical Exam  Nursing note and vitals reviewed. Constitutional: He appears well-developed and well-nourished.  HENT:  Head: Normocephalic and atraumatic.  Eyes: Pupils are equal, round, and reactive to light. Conjunctivae are normal.  Neck: Normal range of motion.  Cardiovascular: Regular rhythm and normal heart sounds.  Respiratory: Effort normal. No respiratory distress.  GI: Soft.  Musculoskeletal: Normal range of motion.  Neurological: He is alert.  Skin: Skin is warm and dry.  Psychiatric: His affect is blunt. His speech is delayed. He is slowed. Cognition and memory are impaired. He expresses inappropriate judgment. He expresses no homicidal and no suicidal ideation.   ROS  Constitutional: Positive for  malaise/fatigue.  HENT: Negative.   Eyes: Negative.   Respiratory: Negative.   Cardiovascular: Negative.   Gastrointestinal: Negative.   Musculoskeletal: Negative.   Skin: Negative.   Neurological: Negative.   Psychiatric/Behavioral: Positive for depression. Negative for hallucinations, memory loss, substance abuse and suicidal ideas. The patient is not nervous/anxious and does not have insomnia.  Blood pressure 112/64, pulse 66, temperature 98.6 F (37 C), temperature source Oral, resp. rate 18, height 5\' 1"  (1.549 m), weight 45.3 kg, SpO2 97 %.Body mass index is 18.87 kg/m.  General Appearance: Disheveled  Eye Contact:  Fair  Speech:  Slow  Volume:  Decreased  Mood:  Euthymic  Affect:  Restricted  Thought Process:  Coherent and Linear  Orientation:  Full (Time, Place, and Person)  Thought Content:  Logical  Suicidal Thoughts:  No  Homicidal Thoughts:  No  Memory:  Fair  Judgement:  Fair  Insight:  Fair  Psychomotor Activity:  Decreased  Concentration:  Concentration: Poor  Recall:  Poor  Fund of Knowledge:  Fair  Language:  Good  Akathisia:  No  Handed:  Right  AIMS (if indicated):     Assets:  Desire for Improvement  ADL's:  Impaired  Cognition:  Impaired,  Mild  Sleep:  Number of Hours: 4.75     Treatment Plan Summary: Daily contact with patient to assess and evaluate symptoms and progress in treatment and Medication management and Plan Patient with major depression.   Continue Lexapro 10mg  PO daily for depression. Continue Hydroxyzine 25mg  PO TID PRN anxiety. Continue psychoeducation and encouragement to the patient.   Continue all medication for medical comorbidities and dialysis sessions. Continue involvement on the unit. Dispo: to be determined with the treatment team next week.    Larita Fife, MD 01/10/2019, 11:58 AM

## 2019-01-10 NOTE — Plan of Care (Signed)
Patient slept well last night without the use of a sleep aid. Reports that his appetite is good and his energy level is normal. Rates his depression, hopelessness and anxiety a 0. Denies SI/HI and states, "I think I'm ready to go home but you know that Coronovirus is out there so maybe I need to stay here." Patient is compliant with medication protocol and meals. Fistula in L) arm is non assessable but + for thrill and bruit. Catheter in place to R) upper chest area that is assessable. Milieu remains safe with q 15 minute safety checks.

## 2019-01-10 NOTE — Progress Notes (Signed)
Patient alert and oriented x 4, denies SI/HI/AVH no distress noted, affect is brighter upon approach , noting interacting in the dayroom with peers, he appears less anxious and receptive to staff. Patient was compliant with medication and he attended evening wrap up group. 15 minutes safety checks maintained will continue to monitor.

## 2019-01-10 NOTE — Progress Notes (Signed)
Patient arrived back to the unit post dialysis. Obtained wt and it was 102 lbs. Per Freida Busman, dialysis nurse, 600 liters of fluid drawn off of patient today. VS stable at 125/69. Meal provided to patient upon arrival to the unit.

## 2019-01-11 NOTE — Progress Notes (Signed)
[facility] MD Progress Note  01/11/2019 8:28 AM Brett Small  MRN:  [id number] Subjective:   Brett Small is a 43yo M with a h/o depressive disorder and a medical h/o ESRD on dialysis, HTN, who was admitted to inpatient psych unit 3 days ago due to depression. Nursing notes reviewed. Patient seen.  Today patient complaints about intermittent sleep last night due to pain in his L arm after dialysis session yesterday. He identifies his mood as "fine". He denies any suicidal ideations and plans. Denies homicidal thoughts. He denies any hallucinations, does not express any delusions. He denies any side effects from his psychotropic medications. The dressing on his foot wound was changed last night. He is willing to discuss his placement options with a treatment team on Monday.  Principal Problem: Depression Diagnosis: Principal Problem:   Depression Active Problems:   End stage renal disease ([place])   Hypertension   Homelessness  Total Time spent with patient: 15 minutes  Past Psychiatric History:see admission H&P  Past Medical History:  Past Medical History:  Diagnosis Date  . Hypertension   . Renal disorder     Past Surgical History:  Procedure Laterality Date  . AORTA - FEMORAL ARTERY BYPASS GRAFT    . AV FISTULA PLACEMENT Left 12/26/2018   Procedure: INSERTION OF GORE STRETCH VASCULAR 4-7MM X  45CM IN LEFT UPPER ARM;  Surgeon: Marty Heck, MD;  Location: [facility] OR;  Service: Vascular;  Laterality: Left;   Family History:  Family History  Problem Relation Age of Onset  . Hypertension Other   . Diabetes Other   . Clotting disorder Father    Family Psychiatric  History: see admission H&P Social History:  Social History   Substance and Sexual Activity  Alcohol Use Not Currently     Social History   Substance and Sexual Activity  Drug Use Never    Social History   Socioeconomic History  . Marital status: Single    Spouse name: Not on file  . Number of children: Not on  file  . Years of education: Not on file  . Highest education level: Not on file  Occupational History  . Not on file  Social Needs  . Financial resource strain: Not on file  . Food insecurity:    Worry: Not on file    Inability: Not on file  . Transportation needs:    Medical: Not on file    Non-medical: Not on file  Tobacco Use  . Smoking status: Former Smoker    Last attempt to quit: 06/26/2018    Years since quitting: 0.5  . Smokeless tobacco: Never Used  . Tobacco comment: smoked for 30 years   Substance and Sexual Activity  . Alcohol use: Not Currently  . Drug use: Never  . Sexual activity: Not on file  Lifestyle  . Physical activity:    Days per week: Not on file    Minutes per session: Not on file  . Stress: Not on file  Relationships  . Social connections:    Talks on phone: Not on file    Gets together: Not on file    Attends religious service: Not on file    Active member of club or organization: Not on file    Attends meetings of clubs or organizations: Not on file    Relationship status: Not on file  Other Topics Concern  . Not on file  Social History Narrative  . Not on file   Additional Social  History:                         Sleep: Poor  Appetite:  Fair  Current Medications: Current Facility-Administered Medications  Medication Dose Route Frequency Provider Last Rate Last Dose  . 0.9 %  sodium chloride infusion  100 mL Intravenous PRN Lateef, Munsoor, MD      . 0.9 %  sodium chloride infusion  100 mL Intravenous PRN Lateef, Munsoor, MD      . acetaminophen (TYLENOL) tablet 650 mg  650 mg Oral Q6H PRN Tennis Ship, MD   650 mg at 01/10/19 2043  . alteplase (CATHFLO ACTIVASE) injection 2 mg  2 mg Intracatheter Once PRN Lateef, Munsoor, MD      . amLODipine (NORVASC) tablet 5 mg  5 mg Oral Daily Tennis Ship, MD   5 mg at 01/11/19 5462  . apixaban (ELIQUIS) tablet 2.5 mg  2.5 mg Oral BID Tennis Ship, MD   2.5 mg at 01/11/19 7035  .  aspirin EC tablet 81 mg  81 mg Oral Daily Tennis Ship, MD   81 mg at 01/11/19 0093  . atorvastatin (LIPITOR) tablet 80 mg  80 mg Oral QHS Tennis Ship, MD   80 mg at 01/10/19 2121  . carvedilol (COREG) tablet 25 mg  25 mg Oral BID WC Tennis Ship, MD   25 mg at 01/11/19 8182  . Chlorhexidine Gluconate Cloth 2 % PADS 6 each  6 each Topical Q0600 Holley Raring, Munsoor, MD   6 each at 01/11/19 0619  . epoetin alfa (EPOGEN,PROCRIT) injection 10,000 Units  10,000 Units Intravenous Q T,Th,Sa-HD Holley Raring, Munsoor, MD   10,000 Units at 01/10/19 1147  . escitalopram (LEXAPRO) tablet 10 mg  10 mg Oral Daily Tennis Ship, MD   10 mg at 01/11/19 9937  . gabapentin (NEURONTIN) capsule 100 mg  100 mg Oral TID Holley Raring, Munsoor, MD   100 mg at 01/11/19 1696  . heparin injection 1,000 Units  1,000 Units Dialysis PRN Lateef, Munsoor, MD      . heparin injection 2,000 Units  2,000 Units Dialysis PRN Roney Jaffe, MD      . hydrALAZINE (APRESOLINE) tablet 25 mg  25 mg Oral Q8H O'Neal, Sarita, MD   25 mg at 01/11/19 7893  . hydrOXYzine (ATARAX/VISTARIL) tablet 25 mg  25 mg Oral TID PRN Tennis Ship, MD   25 mg at 01/11/19 8101  . lidocaine (PF) (XYLOCAINE) 1 % injection 5 mL  5 mL Intradermal PRN Lateef, Munsoor, MD      . lidocaine-prilocaine (EMLA) cream 1 application  1 application Topical PRN Lateef, Munsoor, MD      . multivitamin (RENA-VIT) tablet 1 tablet  1 tablet Oral Daily Tennis Ship, MD   1 tablet at 01/11/19 0821  . pentafluoroprop-tetrafluoroeth (GEBAUERS) aerosol 1 application  1 application Topical PRN Lateef, Munsoor, MD      . sevelamer carbonate (RENVELA) tablet 1,600 mg  1,600 mg Oral TID WC Tennis Ship, MD   1,600 mg at 01/11/19 7510    Lab Results: No results found for this or any previous visit (from the past 48 hour(s)).  Blood Alcohol level:  No results found for: [facility]  Metabolic Disorder Labs: Lab Results  Component Value Date   HGBA1C 5.5 01/08/2019   MPG 111.15 01/08/2019    No results found for: PROLACTIN Lab Results  Component Value Date   CHOL 101 01/08/2019   TRIG 156 (H) 01/08/2019   HDL  33 (L) 01/08/2019   CHOLHDL 3.1 01/08/2019   VLDL 31 01/08/2019   LDLCALC 37 01/08/2019    Physical Findings: AIMS:  , ,  ,  ,    CIWA:    COWS:     Musculoskeletal: Strength & Muscle Tone: within normal limits Gait & Station: normal Patient leans: N/A  Psychiatric Specialty Exam: Physical Exam  ROS  Blood pressure 127/73, pulse 74, temperature 98.2 F (36.8 C), temperature source Oral, resp. rate 18, height 5\' 1"  (1.549 m), weight 46.3 kg, SpO2 98 %.Body mass index is 19.27 kg/m.  General Appearance: Casual  Eye Contact:  Good  Speech:  Normal Rate  Volume:  Normal  Mood:  Euthymic  Affect:  Appropriate  Thought Process:  Coherent and Linear  Orientation:  Full (Time, Place, and Person)  Thought Content:  Logical  Suicidal Thoughts:  No  Homicidal Thoughts:  No  Memory:  Remote;   Fair  Judgement:  Fair  Insight:  Fair  Psychomotor Activity: mildly decreased  Concentration:  Concentration: Fair  Recall:  [employer] of Knowledge:  Fair  Language:  Good  Akathisia:  No  Handed:  Right  AIMS (if indicated):     Assets:  Desire for Improvement Housing Physical Health  ADL's:  Intact  Cognition:  WNL  Sleep:  Number of Hours: 7     Treatment Plan Summary: Daily contact with patient to assess and evaluate symptoms and progress in treatment   Brett Small is a 43yo M with a h/o depressive disorder and a medical h/o ESRD on dialysis, HTN, who was admitted to inpatient psych unit 3 days ago due to depression. Today patient appears euthymic, he reports "fine" mood, denies suicidal or homicidal thoughts. He is focused on his medical co-morbidities. He denies any side effects from psych medications. Will continue current psych medications without changes.  Impression: MDD, recurrent, moderate, without psychotic features.  Plan: -continue  inpatient psych admission;  -15-minute checks  -daily contact with patient to assess and evaluate symptoms and progress in treatment.  -continue Lexapro10mg  PO daily for depression;  -continue Hydroxyzine 25mg  PO TID PRN anxiety;  -continue all other medications for comorbnid medical conditions;   -psychoeducation;  -encouragement to be involved in the milieu;  -Disposition: to be determined   Larita Fife, MD 01/11/2019, 8:28 AM

## 2019-01-11 NOTE — Progress Notes (Signed)
[facility]  ROUNDING NOTE   Subjective:  Patient completed dialysis yesterday. Tolerated well. Resting comfortably in bed at the moment.   Objective:  Vital signs in last 24 hours:  Temp:  [98.2 F (36.8 C)] 98.2 F (36.8 C) (03/21 1409) Pulse Rate:  [71-82] 74 (03/22 0601) Resp:  [18] 18 (03/22 0601) BP: (110-127)/(63-77) 127/73 (03/22 0618) SpO2:  [98 %-100 %] 98 % (03/22 0601) Weight:  [46.3 kg] 46.3 kg (03/21 1429)  Weight change:  Filed Weights   01/08/19 1520 01/08/19 1909 01/10/19 1429  Weight: 46.3 kg 45.3 kg 46.3 kg    Intake/Output: I/O last 3 completed shifts: In: -  Out: 671 [Other:671]   Intake/Output this shift:  No intake/output data recorded.  Physical Exam: General: No acute distress  Head: Normocephalic, atraumatic. Moist oral mucosal membranes  Eyes: Anicteric  Neck: Supple, trachea midline  Lungs:  Clear to auscultation, normal effort  Heart: S1S2 no rubs  Abdomen:  Soft, nontender, bowel sounds present  Extremities: No peripheral edema.  Neurologic: Awake, alert, following commands  Skin: No lesions  Access: Left internal jugular PermCath    Basic Metabolic Panel: Recent Labs  Lab 01/06/19 0701 01/08/19 1611  NA 135  --   K 3.9  --   CL 97*  --   CO2 23  --   GLUCOSE 110*  --   BUN 58*  --   CREATININE 12.11*  --   CALCIUM 8.6*  --   PHOS 8.7* 6.5*    Liver Function Tests: Recent Labs  Lab 01/06/19 0701  ALBUMIN 3.4*   No results for input(s): LIPASE, AMYLASE in the last 168 hours. No results for input(s): AMMONIA in the last 168 hours.  CBC: Recent Labs  Lab 01/06/19 0701 01/08/19 1611  WBC 10.1 7.6  HGB 10.8* 9.0*  HCT 33.4* 27.4*  MCV 90.8 88.1  PLT 228 186    Cardiac Enzymes: No results for input(s): CKTOTAL, CKMB, CKMBINDEX, TROPONINI in the last 168 hours.  BNP: Invalid input(s): POCBNP  CBG: No results for input(s): GLUCAP in the last 168 hours.  Microbiology: Results for orders  placed or performed during the hospital encounter of 12/24/18  MRSA PCR Screening     Status: None   Collection Time: 12/27/18  1:20 PM  Result Value Ref Range Status   MRSA by PCR NEGATIVE NEGATIVE Final    Comment:        The GeneXpert MRSA Assay (FDA approved for NASAL specimens only), is one component of a comprehensive MRSA colonization surveillance program. It is not intended to diagnose MRSA infection nor to guide or monitor treatment for MRSA infections. Performed at [facility], [place] [address] [place], [place] [address]     Coagulation Studies: No results for input(s): LABPROT, INR in the last 72 hours.  Urinalysis: No results for input(s): COLORURINE, LABSPEC, PHURINE, GLUCOSEU, HGBUR, BILIRUBINUR, KETONESUR, PROTEINUR, UROBILINOGEN, NITRITE, LEUKOCYTESUR in the last 72 hours.  Invalid input(s): APPERANCEUR    Imaging: No results found.   Medications:   . sodium chloride    . sodium chloride     . amLODipine  5 mg Oral Daily  . apixaban  2.5 mg Oral BID  . aspirin EC  81 mg Oral Daily  . atorvastatin  80 mg Oral QHS  . carvedilol  25 mg Oral BID WC  . Chlorhexidine Gluconate Cloth  6 each Topical Q0600  . epoetin (EPOGEN/PROCRIT) injection  10,000 Units Intravenous Q T,Th,Sa-HD  .  escitalopram  10 mg Oral Daily  . gabapentin  100 mg Oral TID  . hydrALAZINE  25 mg Oral Q8H  . multivitamin  1 tablet Oral Daily  . sevelamer carbonate  1,600 mg Oral TID WC   sodium chloride, sodium chloride, acetaminophen, alteplase, heparin, heparin, hydrOXYzine, lidocaine (PF), lidocaine-prilocaine, pentafluoroprop-tetrafluoroeth  Assessment/ Plan:  43 y.o. male with a PMHx of ESRD on HD TTHS, hypertension, secondary hyperparathyroidism, depression, anemia chronic kidney disease,aortobifemoral bypass, who was admitted to [facility] on 01/07/2019 for evaluation and treatment of major depressive disorder.  [facility]/TTHS, however patient had not formally  started dialysis there yet.  Patient currently homeless.   1.  ESRD on HD TTHS:   Patient completed dialysis yesterday.  Tolerated well.  Next dialysis treatment for Tuesday.  2.  Anemia chronic kidney disease.    Hemoglobin 9.0 at last check.  Maintain the patient on Epogen 10,070 with dialysis.  3.  Secondary hyperparathyroidism.    Phosphorus down to 6.5.  Maintain the patient on Renvela 2 tablets p.o. 3 times daily with meals.  4.  Hypertension.  Continue amlodipine, carvedilol, hydralazine.  Blood pressure currently acceptable at 127/73.   LOS: 4 Aalyssa Elderkin 3/22/20201:26 PM

## 2019-01-11 NOTE — Plan of Care (Signed)
Patient present in the milieu, ambulating with a steady gait. Complained of anxiety this morning. PRN vistaril administered with effectiveness. In bed this morning after breakfast. Did not attend group this morning. Compliant with medication and meals. Denies having any thoughts of self harm. Milieu remains safe with q 15 minute safety checks.

## 2019-01-11 NOTE — Progress Notes (Signed)
D: Patient has been more isolative to his room tonight than last night. Continues to deny SI, HI and AV hallucinations. Mood is sad. Affect is sad. Denies any physical complaints. Hygiene is unremarkable. Denies pain. Continues to report being anxious and depressed.  A: Continue to monitor for safety and offer support. R: Safety maintained.

## 2019-01-11 NOTE — Plan of Care (Signed)
D: Patient has been more isolative to his room tonight than last night. Continues to deny SI, HI and AV hallucinations. Mood is sad. Affect is sad. Denies any physical complaints. Hygiene is unremarkable. Denies pain. Continues to report being anxious and depressed.  A: Continue to monitor for safety and offer support. R: Safety maintained.

## 2019-01-11 NOTE — [facility]HH Group Notes (Signed)
LCSW Group Therapy Note 01/11/2019 1:15pm  Type of Therapy and Topic: Group Therapy: Feelings Around Returning Home & Establishing a Supportive Framework and Supporting Oneself When Supports Not Available  Participation Level: Did Not Attend  Description of Group:  Patients first processed thoughts and feelings about upcoming discharge. These included fears of upcoming changes, lack of change, new living environments, judgements and expectations from others and overall stigma of mental health issues. The group then discussed the definition of a supportive framework, what that looks and feels like, and how do to discern it from an unhealthy non-supportive network. The group identified different types of supports as well as what to do when your family/friends are less than helpful or unavailable  Therapeutic Goals  1. Patient will identify one healthy supportive network that they can use at discharge. 2. Patient will identify one factor of a supportive framework and how to tell it from an unhealthy network. 3. Patient able to identify one coping skill to use when they do not have positive supports from others. 4. Patient will demonstrate ability to communicate their needs through discussion and/or role plays.  Summary of Patient Progress:  Pt was invited to attend group but chose not to attend. CSW will continue to encourage pt to attend group throughout their admission.    Therapeutic Modalities Cognitive Behavioral Therapy Motivational Interviewing   Teigen Bellin  CUEBAS-COLON, LCSW 01/11/2019 8:16 AM

## 2019-01-12 LAB — CBC
HCT: 27.8 % — ABNORMAL LOW (ref 39.0–52.0)
Hemoglobin: 9 g/dL — ABNORMAL LOW (ref 13.0–17.0)
MCH: 29 pg (ref 26.0–34.0)
MCHC: 32.4 g/dL (ref 30.0–36.0)
MCV: 89.7 fL (ref 80.0–100.0)
Platelets: 195 10*3/uL (ref 150–400)
RBC: 3.1 MIL/uL — ABNORMAL LOW (ref 4.22–5.81)
RDW: 14.5 % (ref 11.5–15.5)
WBC: 7 10*3/uL (ref 4.0–10.5)
nRBC: 0 % (ref 0.0–0.2)

## 2019-01-12 NOTE — Progress Notes (Signed)
Recreation Therapy Notes  Date: 01/12/2019  Time: 9:30 am  Location: Craft Room  Behavioral response: Appropriate  Intervention Topic: Goals  Discussion/Intervention:  Group content on today was focused on goals. Patients described what goals are and how they define goals. Individuals expressed how they go about setting goals and reaching them. The group identified how important goals are and if they make short term goals to reach long term goals. Patients described how many goals they work on at a time and what affects them not reaching their goal. Individuals described how much time they put into planning and obtaining their goals. The group participated in the intervention "My Goal Board" and made personal goal boards to help them achieve their goal. Clinical Observations/Feedback:  Patient came to group and explained that he writes he goals down so he knows what he needs to be working on. Individual was social with peers and staff while participating in group.  Myrtis Maille LRT/CTRS         Reylene Stauder 01/12/2019 11:04 AM

## 2019-01-12 NOTE — Progress Notes (Signed)
[facility]  ROUNDING NOTE   Subjective:   Doing well with no complaints.   Objective:  Vital signs in last 24 hours:  Temp:  [98.5 F (36.9 C)] 98.5 F (36.9 C) (03/23 0606) Pulse Rate:  [76-84] 84 (03/23 0738) Resp:  [16-18] 16 (03/23 0606) BP: (97-118)/(53-77) 110/74 (03/23 0621) SpO2:  [98 %-100 %] 99 % (03/23 0606) Weight:  [46.7 kg-51.3 kg] 49.9 kg (03/23 1100)  Weight change: 0.454 kg Filed Weights   01/11/19 1750 01/12/19 0606 01/12/19 1100  Weight: 46.7 kg 51.3 kg 49.9 kg    Intake/Output: No intake/output data recorded.   Intake/Output this shift:  No intake/output data recorded.  Physical Exam: General: No acute distress  Head: Normocephalic, atraumatic. Moist oral mucosal membranes  Eyes: Anicteric  Neck: Supple, trachea midline  Lungs:  Clear to auscultation, normal effort  Heart: regular  Abdomen:  Soft, nontender, bowel sounds present  Extremities: No peripheral edema.  Neurologic: Awake, alert, oriented  Skin: No lesions  Access: Left internal jugular PermCath, left arm AVF +thrill and +bruit    Basic Metabolic Panel: Recent Labs  Lab 01/06/19 0701 01/08/19 1611  NA 135  --   K 3.9  --   CL 97*  --   CO2 23  --   GLUCOSE 110*  --   BUN 58*  --   CREATININE 12.11*  --   CALCIUM 8.6*  --   PHOS 8.7* 6.5*    Liver Function Tests: Recent Labs  Lab 01/06/19 0701  ALBUMIN 3.4*   No results for input(s): LIPASE, AMYLASE in the last 168 hours. No results for input(s): AMMONIA in the last 168 hours.  CBC: Recent Labs  Lab 01/06/19 0701 01/08/19 1611 01/12/19 0707  WBC 10.1 7.6 7.0  HGB 10.8* 9.0* 9.0*  HCT 33.4* 27.4* 27.8*  MCV 90.8 88.1 89.7  PLT 228 186 195    Cardiac Enzymes: No results for input(s): CKTOTAL, CKMB, CKMBINDEX, TROPONINI in the last 168 hours.  BNP: Invalid input(s): POCBNP  CBG: No results for input(s): GLUCAP in the last 168 hours.  Microbiology: Results for orders placed or performed  during the hospital encounter of 12/24/18  MRSA PCR Screening     Status: None   Collection Time: 12/27/18  1:20 PM  Result Value Ref Range Status   MRSA by PCR NEGATIVE NEGATIVE Final    Comment:        The GeneXpert MRSA Assay (FDA approved for NASAL specimens only), is one component of a comprehensive MRSA colonization surveillance program. It is not intended to diagnose MRSA infection nor to guide or monitor treatment for MRSA infections. Performed at [facility], [place] [address] [place], [place] [address]     Coagulation Studies: No results for input(s): LABPROT, INR in the last 72 hours.  Urinalysis: No results for input(s): COLORURINE, LABSPEC, PHURINE, GLUCOSEU, HGBUR, BILIRUBINUR, KETONESUR, PROTEINUR, UROBILINOGEN, NITRITE, LEUKOCYTESUR in the last 72 hours.  Invalid input(s): APPERANCEUR    Imaging: No results found.   Medications:   . sodium chloride    . sodium chloride     . amLODipine  5 mg Oral Daily  . apixaban  2.5 mg Oral BID  . aspirin EC  81 mg Oral Daily  . atorvastatin  80 mg Oral QHS  . carvedilol  25 mg Oral BID WC  . Chlorhexidine Gluconate Cloth  6 each Topical Q0600  . epoetin (EPOGEN/PROCRIT) injection  10,000 Units Intravenous Q T,Th,Sa-HD  .  escitalopram  10 mg Oral Daily  . gabapentin  100 mg Oral TID  . hydrALAZINE  25 mg Oral Q8H  . multivitamin  1 tablet Oral Daily  . sevelamer carbonate  1,600 mg Oral TID WC   sodium chloride, sodium chloride, acetaminophen, alteplase, heparin, heparin, hydrOXYzine, lidocaine (PF), lidocaine-prilocaine, pentafluoroprop-tetrafluoroeth  Assessment/ Plan:  43 y.o. male  Mr. Cairo Agostinelli is a 43 y.o. white male with end stage renal disease on hemodialysis, hypertension, depression, aortobifemoral bypass, who was admitted to [facility] on 01/07/2019 for evaluation and treatment of major depressive disorder.  [facility] TTS   1.  ESRD: Next dialysis treatment for  Tuesday.  2.  Anemia chronic kidney disease: hemoglobin 9 - EPO with HD treatment  3.  Secondary hyperparathyroidism with hyperphosphatemia: phosphorus 6.5 - Sevelamer with meals  4.  Hypertension.   Current regimen of amlodipine, carvedilol, hydralazine.      LOS: 5 Arthella Headings 3/23/202012:57 PM

## 2019-01-12 NOTE — Progress Notes (Signed)
[facility] MD Progress Note  01/12/2019 4:45 PM Brett Small  MRN:  [id number] Subjective: Patient seen chart reviewed.  This gentleman with end-stage renal failure on dialysis and recent depression has no new complaints.  Mood is stable.  He is up out of his bed and interacting with peers appropriately.  Denies suicidal ideation.  Takes at least basic care of his ADLs.  Patient says he is planning to call his father to see if he can come back to live with him.  Has been receiving dialysis and appears to be medically stabilized with his energy improved Principal Problem: Depression Diagnosis: Principal Problem:   Depression Active Problems:   End stage renal disease ([place])   Hypertension   Homelessness  Total Time spent with patient: 30 minutes  Past Psychiatric History: Patient has no previous psychiatric hospitalization or suicide attempt  Past Medical History:  Past Medical History:  Diagnosis Date  . Hypertension   . Renal disorder     Past Surgical History:  Procedure Laterality Date  . AORTA - FEMORAL ARTERY BYPASS GRAFT    . AV FISTULA PLACEMENT Left 12/26/2018   Procedure: INSERTION OF GORE STRETCH VASCULAR 4-7MM X  45CM IN LEFT UPPER ARM;  Surgeon: Marty Heck, MD;  Location: [facility] OR;  Service: Vascular;  Laterality: Left;   Family History:  Family History  Problem Relation Age of Onset  . Hypertension Other   . Diabetes Other   . Clotting disorder Father    Family Psychiatric  History: See previous Social History:  Social History   Substance and Sexual Activity  Alcohol Use Not Currently     Social History   Substance and Sexual Activity  Drug Use Never    Social History   Socioeconomic History  . Marital status: Single    Spouse name: Not on file  . Number of children: Not on file  . Years of education: Not on file  . Highest education level: Not on file  Occupational History  . Not on file  Social Needs  . Financial resource strain: Not on file  .  Food insecurity:    Worry: Not on file    Inability: Not on file  . Transportation needs:    Medical: Not on file    Non-medical: Not on file  Tobacco Use  . Smoking status: Former Smoker    Last attempt to quit: 06/26/2018    Years since quitting: 0.5  . Smokeless tobacco: Never Used  . Tobacco comment: smoked for 30 years   Substance and Sexual Activity  . Alcohol use: Not Currently  . Drug use: Never  . Sexual activity: Not on file  Lifestyle  . Physical activity:    Days per week: Not on file    Minutes per session: Not on file  . Stress: Not on file  Relationships  . Social connections:    Talks on phone: Not on file    Gets together: Not on file    Attends religious service: Not on file    Active member of club or organization: Not on file    Attends meetings of clubs or organizations: Not on file    Relationship status: Not on file  Other Topics Concern  . Not on file  Social History Narrative  . Not on file   Additional Social History:                         Sleep:  Fair  Appetite:  Fair  Current Medications: Current Facility-Administered Medications  Medication Dose Route Frequency Provider Last Rate Last Dose  . 0.9 %  sodium chloride infusion  100 mL Intravenous PRN Lateef, Munsoor, MD      . 0.9 %  sodium chloride infusion  100 mL Intravenous PRN Lateef, Munsoor, MD      . acetaminophen (TYLENOL) tablet 650 mg  650 mg Oral Q6H PRN Tennis Ship, MD   650 mg at 01/12/19 0622  . alteplase (CATHFLO ACTIVASE) injection 2 mg  2 mg Intracatheter Once PRN Lateef, Munsoor, MD      . amLODipine (NORVASC) tablet 5 mg  5 mg Oral Daily Tennis Ship, MD   5 mg at 01/12/19 0740  . apixaban (ELIQUIS) tablet 2.5 mg  2.5 mg Oral BID Tennis Ship, MD   2.5 mg at 01/12/19 0740  . aspirin EC tablet 81 mg  81 mg Oral Daily Tennis Ship, MD   81 mg at 01/12/19 0740  . atorvastatin (LIPITOR) tablet 80 mg  80 mg Oral QHS Tennis Ship, MD   80 mg at 01/11/19  2114  . carvedilol (COREG) tablet 25 mg  25 mg Oral BID WC Tennis Ship, MD   25 mg at 01/12/19 0740  . Chlorhexidine Gluconate Cloth 2 % PADS 6 each  6 each Topical Q0600 Holley Raring, Munsoor, MD   6 each at 01/11/19 0619  . epoetin alfa (EPOGEN,PROCRIT) injection 10,000 Units  10,000 Units Intravenous Q T,Th,Sa-HD Holley Raring, Munsoor, MD   10,000 Units at 01/10/19 1147  . escitalopram (LEXAPRO) tablet 10 mg  10 mg Oral Daily Tennis Ship, MD   10 mg at 01/12/19 0740  . gabapentin (NEURONTIN) capsule 100 mg  100 mg Oral TID Holley Raring, Munsoor, MD   100 mg at 01/12/19 1245  . heparin injection 1,000 Units  1,000 Units Dialysis PRN Lateef, Munsoor, MD      . heparin injection 2,000 Units  2,000 Units Dialysis PRN Roney Jaffe, MD      . hydrALAZINE (APRESOLINE) tablet 25 mg  25 mg Oral Q8H O'Neal, Sarita, MD   25 mg at 01/12/19 1447  . hydrOXYzine (ATARAX/VISTARIL) tablet 25 mg  25 mg Oral TID PRN Tennis Ship, MD   25 mg at 01/12/19 0749  . lidocaine (PF) (XYLOCAINE) 1 % injection 5 mL  5 mL Intradermal PRN Lateef, Munsoor, MD      . lidocaine-prilocaine (EMLA) cream 1 application  1 application Topical PRN Lateef, Munsoor, MD      . multivitamin (RENA-VIT) tablet 1 tablet  1 tablet Oral Daily Tennis Ship, MD   1 tablet at 01/12/19 0740  . pentafluoroprop-tetrafluoroeth (GEBAUERS) aerosol 1 application  1 application Topical PRN Lateef, Munsoor, MD      . sevelamer carbonate (RENVELA) tablet 1,600 mg  1,600 mg Oral TID WC Tennis Ship, MD   1,600 mg at 01/12/19 1245    Lab Results:  Results for orders placed or performed during the hospital encounter of 01/07/19 (from the past 48 hour(s))  CBC     Status: Abnormal   Collection Time: 01/12/19  7:07 AM  Result Value Ref Range   WBC 7.0 4.0 - 10.5 K/uL   RBC 3.10 (L) 4.22 - 5.81 MIL/uL   Hemoglobin 9.0 (L) 13.0 - 17.0 g/dL   HCT 27.8 (L) 39.0 - 52.0 %   MCV 89.7 80.0 - 100.0 fL   MCH 29.0 26.0 - 34.0 pg   MCHC 32.4 30.0 - 36.0 g/dL  RDW  14.5 11.5 - 15.5 %   Platelets 195 150 - 400 K/uL   nRBC 0.0 0.0 - 0.2 %    Comment: Performed at [facility], [place] [place], [place] [address]    Blood Alcohol level:  No results found for: [facility]  Metabolic Disorder Labs: Lab Results  Component Value Date   HGBA1C 5.5 01/08/2019   MPG 111.15 01/08/2019   No results found for: PROLACTIN Lab Results  Component Value Date   CHOL 101 01/08/2019   TRIG 156 (H) 01/08/2019   HDL 33 (L) 01/08/2019   CHOLHDL 3.1 01/08/2019   VLDL 31 01/08/2019   LDLCALC 37 01/08/2019    Physical Findings: AIMS:  , ,  ,  ,    CIWA:    COWS:     Musculoskeletal: Strength & Muscle Tone: within normal limits Gait & Station: normal Patient leans: Right  Psychiatric Specialty Exam: Physical Exam  Nursing note and vitals reviewed. Constitutional: He appears well-developed and well-nourished.  HENT:  Head: Normocephalic and atraumatic.  Eyes: Pupils are equal, round, and reactive to light. Conjunctivae are normal.  Neck: Normal range of motion.  Cardiovascular: Regular rhythm and normal heart sounds.  Respiratory: Effort normal. No respiratory distress.  GI: Soft.  Musculoskeletal: Normal range of motion.  Neurological: He is alert.  Skin: Skin is warm and dry.  Psychiatric: He has a normal mood and affect. His speech is normal and behavior is normal. Judgment and thought content normal. Cognition and memory are normal.    Review of Systems  Constitutional: Negative.   HENT: Negative.   Eyes: Negative.   Respiratory: Negative.   Cardiovascular: Negative.   Gastrointestinal: Negative.   Musculoskeletal: Negative.   Skin: Negative.   Neurological: Negative.   Psychiatric/Behavioral: Negative.     Blood pressure 121/76, pulse 78, temperature 98.7 F (37.1 C), temperature source Oral, resp. rate 16, height 5\' 1"  (1.549 m), weight 49.9 kg, SpO2 98 %.Body mass index is 20.78 kg/m.  General Appearance: Casual  Eye  Contact:  Fair  Speech:  Slow  Volume:  Decreased  Mood:  Euthymic  Affect:  Appropriate  Thought Process:  Goal Directed  Orientation:  Full (Time, Place, and Person)  Thought Content:  Logical  Suicidal Thoughts:  No  Homicidal Thoughts:  No  Memory:  Immediate;   Fair Recent;   Fair Remote;   Fair  Judgement:  Fair  Insight:  Fair  Psychomotor Activity:  Decreased  Concentration:  Concentration: Fair  Recall:  [employer] of Knowledge:  Fair  Language:  Fair  Akathisia:  No  Handed:  Right  AIMS (if indicated):     Assets:  Desire for Improvement  ADL's:  Intact  Cognition:  WNL  Sleep:  Number of Hours: 6.25     Treatment Plan Summary: Daily contact with patient to assess and evaluate symptoms and progress in treatment, Medication management and Plan Patient seen chart reviewed.  Patient is stable psychiatrically and medically.  Awaiting some plan for placement.  No immediate change to treatment for today.  Alethia Berthold, MD 01/12/2019, 4:45 PM

## 2019-01-12 NOTE — BHH Counselor (Signed)
CSW returned missed call from patient's father.  Return call was not answered and CSW left HIPAA compliant voicemail.  [name], MSW, LCSW 01/12/2019 3:26 PM

## 2019-01-12 NOTE — Plan of Care (Signed)
Patient reports that he slept fair last right without the use of an sleep aid. Appetite is fair with normal energy. Energy level normal with good concentration. Rates his depression, hopelessness and anxiety a 0. Isolative to room, observed in bed resting with his eyes closed. Compliant with medication protocol. Complained of some anxiety, prn Vistaril administered with effectiveness. Milieu remains safe with q 15 minute safety checks.

## 2019-01-12 NOTE — B[facility] Group Notes (Signed)
LCSW Group Therapy Note   01/12/2019 1:00 PM  Type of Therapy and Topic:  Group Therapy:  Overcoming Obstacles   Participation Level:  Minimal   Description of Group:    In this group patients will be encouraged to explore what they see as obstacles to their own wellness and recovery. They will be guided to discuss their thoughts, feelings, and behaviors related to these obstacles. The group will process together ways to cope with barriers, with attention given to specific choices patients can make. Each patient will be challenged to identify changes they are motivated to make in order to overcome their obstacles. This group will be process-oriented, with patients participating in exploration of their own experiences as well as giving and receiving support and challenge from other group members.   Therapeutic Goals: 1. Patient will identify personal and current obstacles as they relate to admission. 2. Patient will identify barriers that currently interfere with their wellness or overcoming obstacles.  3. Patient will identify feelings, thought process and behaviors related to these barriers. 4. Patient will identify two changes they are willing to make to overcome these obstacles:      Summary of Patient Progress Patient was present for group.  Patient was supportive of other group members.  Patient reports that his current obstacles are "getting my own place".  Patient was able to engage in discussion on how to address this including using his SSI check to help pay for his own place.     Therapeutic Modalities:   Cognitive Behavioral Therapy Solution Focused Therapy Motivational Interviewing Relapse Prevention Therapy  [name], MSW, LCSW 01/12/2019 12:42 PM

## 2019-01-13 LAB — PHOSPHORUS: Phosphorus: 3 mg/dL (ref 2.5–4.6)

## 2019-01-13 MED ORDER — GABAPENTIN 100 MG PO CAPS
100.0000 mg | ORAL_CAPSULE | Freq: Three times a day (TID) | ORAL | Status: DC
  Administered 2019-01-13 – 2019-01-20 (×17): 100 mg via ORAL
  Filled 2019-01-13 (×17): qty 1

## 2019-01-13 MED ORDER — APIXABAN 2.5 MG PO TABS
2.5000 mg | ORAL_TABLET | Freq: Two times a day (BID) | ORAL | Status: DC
  Administered 2019-01-13 – 2019-01-20 (×13): 2.5 mg via ORAL
  Filled 2019-01-13 (×15): qty 1

## 2019-01-13 NOTE — Progress Notes (Signed)
HD Tx End   01/13/19 1215  Vital Signs  Temp 98.4 F (36.9 C)  Temp Source Oral  Pulse Rate 75  Pulse Rate Source Monitor  Resp 14  BP 120/71  BP Location Right Arm  BP Method Automatic  Patient Position (if appropriate) Sitting  Oxygen Therapy  SpO2 100 %  O2 Device Room Air  Pain Assessment  Pain Scale 0-10  Pain Score 5  Pain Type Chronic pain;Surgical pain  Pain Location  (nerve pain / left arm/ from AV graft placement)  Dialysis Weight  Weight 47.5 kg  Type of Weight Post-Dialysis  During Hemodialysis Assessment  Blood Flow Rate (mL/min) 200 mL/min  Arterial Pressure (mmHg) -80 mmHg  Venous Pressure (mmHg) 50 mmHg  Transmembrane Pressure (mmHg) 60 mmHg  Ultrafiltration Rate (mL/min) 670 mL/min  Dialysate Flow Rate (mL/min) 600 ml/min  Conductivity: Machine  14  HD Safety Checks Performed Yes  Dialysis Fluid Bolus Normal Saline  Bolus Amount (mL) 250 mL  Intra-Hemodialysis Comments Tx completed  Fistula / Graft Left Upper arm Arteriovenous vein graft  Placement Date/Time: 12/26/18 1024   Placed prior to admission: No  Orientation: Left  Access Location: Upper arm  Access Type: (c) Arteriovenous vein graft  Site Condition No complications  Fistula / Graft Assessment Present;Thrill;Bruit  Drainage Description None  Hemodialysis Catheter Right Subclavian  No Placement Date or Time found.   Placed prior to admission: Yes  Orientation: Right  Access Location: Subclavian  Site Condition No complications  Blue Lumen Status Flushed;Saline locked;Capped (Central line);Heparin locked  Red Lumen Status Flushed;Saline locked;Capped (Central line);Heparin locked  Catheter fill solution Heparin 1000 units/ml  Catheter fill volume (Arterial) 1.5 cc  Catheter fill volume (Venous) 1.5  Dressing Type Biopatch  Dressing Status Clean;Dry;Intact;Dressing changed;Antimicrobial disc changed  Interventions New dressing  Drainage Description None  Post treatment catheter status  Capped and Clamped

## 2019-01-13 NOTE — Progress Notes (Signed)
Post HD Assessment  1525 mL fluid removal, tolerated tx well.    01/13/19 1227  Neurological  Level of Consciousness Alert  Orientation Level Oriented X4  Respiratory  Respiratory Pattern Regular  Bilateral Breath Sounds Clear;Diminished  Cardiac  Pulse Regular  Vascular  R Radial Pulse +2  L Radial Pulse +2  Integumentary  Integumentary (WDL) WDL  Musculoskeletal  Musculoskeletal (WDL) WDL  GU Assessment  Genitourinary (WDL) WDL  Psychosocial  Psychosocial (WDL) WDL  Emotional support given Given to patient

## 2019-01-13 NOTE — Progress Notes (Signed)
Recreation Therapy Notes    Date: 01/13/2019  Time: 9:30 am   Location: Craft room   Behavioral response: N/A   Intervention Topic: Values  Discussion/Intervention: Patient did not attend group.   Clinical Observations/Feedback:  Patient did not attend group.   Hansford Hirt LRT/CTRS        Jaidon Sponsel 01/13/2019 10:48 AM

## 2019-01-13 NOTE — Progress Notes (Signed)
[facility] MD Progress Note  01/13/2019 6:20 PM Brett Small  MRN:  [id number] Subjective: Mood improved.  Minor headache in the afternoon otherwise no new complaints.  Behavior calm.  Appropriate.  No signs of acute dangerousness. Principal Problem: Depression Diagnosis: Principal Problem:   Depression Active Problems:   End stage renal disease (Antoine)   Hypertension   Homelessness  Total Time spent with patient: 15 minutes  Past Psychiatric History: No previous significant  Past Medical History:  Past Medical History:  Diagnosis Date  . Hypertension   . Renal disorder     Past Surgical History:  Procedure Laterality Date  . AORTA - FEMORAL ARTERY BYPASS GRAFT    . AV FISTULA PLACEMENT Left 12/26/2018   Procedure: INSERTION OF GORE STRETCH VASCULAR 4-7MM X  45CM IN LEFT UPPER ARM;  Surgeon: Marty Heck, MD;  Location: [facility] OR;  Service: Vascular;  Laterality: Left;   Family History:  Family History  Problem Relation Age of Onset  . Hypertension Other   . Diabetes Other   . Clotting disorder Father    Family Psychiatric  History: None Social History:  Social History   Substance and Sexual Activity  Alcohol Use Not Currently     Social History   Substance and Sexual Activity  Drug Use Never    Social History   Socioeconomic History  . Marital status: Single    Spouse name: Not on file  . Number of children: Not on file  . Years of education: Not on file  . Highest education level: Not on file  Occupational History  . Not on file  Social Needs  . Financial resource strain: Not on file  . Food insecurity:    Worry: Not on file    Inability: Not on file  . Transportation needs:    Medical: Not on file    Non-medical: Not on file  Tobacco Use  . Smoking status: Former Smoker    Last attempt to quit: 06/26/2018    Years since quitting: 0.5  . Smokeless tobacco: Never Used  . Tobacco comment: smoked for 30 years   Substance and Sexual Activity  . Alcohol  use: Not Currently  . Drug use: Never  . Sexual activity: Not on file  Lifestyle  . Physical activity:    Days per week: Not on file    Minutes per session: Not on file  . Stress: Not on file  Relationships  . Social connections:    Talks on phone: Not on file    Gets together: Not on file    Attends religious service: Not on file    Active member of club or organization: Not on file    Attends meetings of clubs or organizations: Not on file    Relationship status: Not on file  Other Topics Concern  . Not on file  Social History Narrative  . Not on file   Additional Social History:                         Sleep: Fair  Appetite:  Fair  Current Medications: Current Facility-Administered Medications  Medication Dose Route Frequency Provider Last Rate Last Dose  . 0.9 %  sodium chloride infusion  100 mL Intravenous PRN Lateef, Munsoor, MD      . 0.9 %  sodium chloride infusion  100 mL Intravenous PRN Lateef, Munsoor, MD      . acetaminophen (TYLENOL) tablet 650 mg  650  mg Oral Q6H PRN Tennis Ship, MD   650 mg at 01/13/19 1425  . alteplase (CATHFLO ACTIVASE) injection 2 mg  2 mg Intracatheter Once PRN Lateef, Munsoor, MD      . amLODipine (NORVASC) tablet 5 mg  5 mg Oral Daily O'Neal, Sarita, MD   5 mg at 01/13/19 1402  . apixaban (ELIQUIS) tablet 2.5 mg  2.5 mg Oral BID Clapacs, John T, MD      . aspirin EC tablet 81 mg  81 mg Oral Daily Tennis Ship, MD   81 mg at 01/13/19 1401  . atorvastatin (LIPITOR) tablet 80 mg  80 mg Oral QHS Tennis Ship, MD   80 mg at 01/12/19 2200  . carvedilol (COREG) tablet 25 mg  25 mg Oral BID WC Tennis Ship, MD   25 mg at 01/13/19 1706  . Chlorhexidine Gluconate Cloth 2 % PADS 6 each  6 each Topical Q0600 Holley Raring, Munsoor, MD   6 each at 01/11/19 0619  . epoetin alfa (EPOGEN,PROCRIT) injection 10,000 Units  10,000 Units Intravenous Q T,Th,Sa-HD Holley Raring, Munsoor, MD   10,000 Units at 01/13/19 1041  . escitalopram (LEXAPRO) tablet  10 mg  10 mg Oral Daily Tennis Ship, MD   10 mg at 01/13/19 1403  . gabapentin (NEURONTIN) capsule 100 mg  100 mg Oral TID Clapacs, John T, MD      . heparin injection 1,000 Units  1,000 Units Dialysis PRN Holley Raring, Munsoor, MD      . heparin injection 2,000 Units  2,000 Units Dialysis PRN Roney Jaffe, MD      . hydrALAZINE (APRESOLINE) tablet 25 mg  25 mg Oral Q8H O'Neal, Sarita, MD   25 mg at 01/13/19 1410  . hydrOXYzine (ATARAX/VISTARIL) tablet 25 mg  25 mg Oral TID PRN Tennis Ship, MD   25 mg at 01/13/19 4098  . lidocaine (PF) (XYLOCAINE) 1 % injection 5 mL  5 mL Intradermal PRN Lateef, Munsoor, MD      . lidocaine-prilocaine (EMLA) cream 1 application  1 application Topical PRN Lateef, Munsoor, MD      . multivitamin (RENA-VIT) tablet 1 tablet  1 tablet Oral Daily Tennis Ship, MD   1 tablet at 01/13/19 1409  . pentafluoroprop-tetrafluoroeth (GEBAUERS) aerosol 1 application  1 application Topical PRN Lateef, Munsoor, MD      . sevelamer carbonate (RENVELA) tablet 1,600 mg  1,600 mg Oral TID WC Tennis Ship, MD   1,600 mg at 01/13/19 1706    Lab Results:  Results for orders placed or performed during the hospital encounter of 01/07/19 (from the past 48 hour(s))  CBC     Status: Abnormal   Collection Time: 01/12/19  7:07 AM  Result Value Ref Range   WBC 7.0 4.0 - 10.5 K/uL   RBC 3.10 (L) 4.22 - 5.81 MIL/uL   Hemoglobin 9.0 (L) 13.0 - 17.0 g/dL   HCT 27.8 (L) 39.0 - 52.0 %   MCV 89.7 80.0 - 100.0 fL   MCH 29.0 26.0 - 34.0 pg   MCHC 32.4 30.0 - 36.0 g/dL   RDW 14.5 11.5 - 15.5 %   Platelets 195 150 - 400 K/uL   nRBC 0.0 0.0 - 0.2 %    Comment: Performed at [facility], [address], [place], [place] [address]  Phosphorus     Status: None   Collection Time: 01/13/19  9:32 AM  Result Value Ref Range   Phosphorus 3.0 2.5 - 4.6 mg/dL    Comment: Performed  at [facility], [address] [place], [place] [address]    Blood Alcohol level:  No  results found for: [facility]  Metabolic Disorder Labs: Lab Results  Component Value Date   HGBA1C 5.5 01/08/2019   MPG 111.15 01/08/2019   No results found for: PROLACTIN Lab Results  Component Value Date   CHOL 101 01/08/2019   TRIG 156 (H) 01/08/2019   HDL 33 (L) 01/08/2019   CHOLHDL 3.1 01/08/2019   VLDL 31 01/08/2019   LDLCALC 37 01/08/2019    Physical Findings: AIMS:  , ,  ,  ,    CIWA:    COWS:     Musculoskeletal: Strength & Muscle Tone: within normal limits Gait & Station: normal Patient leans: N/A  Psychiatric Specialty Exam: Physical Exam  Nursing note and vitals reviewed. Constitutional: He appears well-developed and well-nourished.  HENT:  Head: Normocephalic and atraumatic.  Eyes: Pupils are equal, round, and reactive to light. Conjunctivae are normal.  Neck: Normal range of motion.  Cardiovascular: Regular rhythm and normal heart sounds.  Respiratory: Effort normal.  GI: Soft.  Musculoskeletal: Normal range of motion.  Neurological: He is alert.  Skin: Skin is warm and dry.  Psychiatric: He has a normal mood and affect. His behavior is normal. Judgment and thought content normal.    Review of Systems  Constitutional: Negative.   HENT: Negative.   Eyes: Negative.   Respiratory: Negative.   Cardiovascular: Negative.   Gastrointestinal: Negative.   Musculoskeletal: Negative.   Skin: Negative.   Neurological: Negative.   Psychiatric/Behavioral: Negative.     Blood pressure 139/81, pulse 85, temperature 98.4 F (36.9 C), temperature source Oral, resp. rate 16, height 5\' 1"  (1.549 m), weight 47.5 kg, SpO2 100 %.Body mass index is 19.79 kg/m.  General Appearance: Casual  Eye Contact:  Fair  Speech:  Clear and Coherent  Volume:  Normal  Mood:  Euthymic  Affect:  Congruent  Thought Process:  Goal Directed  Orientation:  Full (Time, Place, and Person)  Thought Content:  Logical  Suicidal Thoughts:  No  Homicidal Thoughts:  No  Memory:  Immediate;    Fair Recent;   Fair Remote;   Fair  Judgement:  Fair  Insight:  Fair  Psychomotor Activity:  Normal  Concentration:  Concentration: Fair  Recall:  [employer] of Knowledge:  Fair  Language:  Fair  Akathisia:  No  Handed:  Right  AIMS (if indicated):     Assets:  Desire for Improvement Financial Resources/Insurance Social Support  ADL's:  Intact  Cognition:  WNL  Sleep:  Number of Hours: 6     Treatment Plan Summary: Daily contact with patient to assess and evaluate symptoms and progress in treatment, Medication management and Plan No change to medication.  Depression controlled.  Patient can be discharged as soon as we locate an appropriate disposition plan  Alethia Berthold, MD 01/13/2019, 6:20 PM

## 2019-01-13 NOTE — BHH Group Notes (Signed)
Feelings Around Diagnosis 01/13/2019 1PM  Type of Therapy/Topic:  Group Therapy:  Feelings about Diagnosis  Participation Level:  Did Not Attend   Description of Group:   This group will allow patients to explore their thoughts and feelings about diagnoses they have received. Patients will be guided to explore their level of understanding and acceptance of these diagnoses. Facilitator will encourage patients to process their thoughts and feelings about the reactions of others to their diagnosis and will guide patients in identifying ways to discuss their diagnosis with significant others in their lives. This group will be process-oriented, with patients participating in exploration of their own experiences, giving and receiving support, and processing challenge from other group members.   Therapeutic Goals: 1. Patient will demonstrate understanding of diagnosis as evidenced by identifying two or more symptoms of the disorder 2. Patient will be able to express two feelings regarding the diagnosis 3. Patient will demonstrate their ability to communicate their needs through discussion and/or role play  Summary of Patient Progress:       Therapeutic Modalities:   Cognitive Behavioral Therapy Brief Therapy Feelings Identification    Yvette Rack, LCSW 01/13/2019 2:18 PM

## 2019-01-13 NOTE — Progress Notes (Signed)
Pt denies SI. HI and rates anxiety and depression as a "1". Pt requested his prn  hydroxine for anxiety before dialysis. Collier Bullock RN

## 2019-01-13 NOTE — Progress Notes (Signed)
Pre HD Assessment   01/13/19 0900  Neurological  Level of Consciousness Alert  Orientation Level Oriented X4  Respiratory  Respiratory Pattern Regular  Chest Assessment Chest expansion symmetrical  Bilateral Breath Sounds Clear;Diminished  Cardiac  Pulse Regular  Vascular  R Radial Pulse +2  L Radial Pulse +2  Integumentary  Integumentary (WDL) WDL  Musculoskeletal  Musculoskeletal (WDL) WDL  GU Assessment  Genitourinary (WDL) WDL  Psychosocial  Psychosocial (WDL) WDL  Emotional support given Given to patient

## 2019-01-13 NOTE — Progress Notes (Signed)
[facility]  ROUNDING NOTE   Subjective:   Seen and examined on hemodialysis. Tolerating treatment well. UF goal of 1.5 liters.   Patient with depressed affect    HEMODIALYSIS FLOWSHEET:  Blood Flow Rate (mL/min): 400 mL/min Arterial Pressure (mmHg): -160 mmHg Venous Pressure (mmHg): 140 mmHg Transmembrane Pressure (mmHg): 60 mmHg Ultrafiltration Rate (mL/min): 670 mL/min Dialysate Flow Rate (mL/min): 600 ml/min Conductivity: Machine : 14 Conductivity: Machine : 14 Dialysis Fluid Bolus: Normal Saline Bolus Amount (mL): 250 mL    Objective:  Vital signs in last 24 hours:  Temp:  [97.8 F (36.6 C)-98.7 F (37.1 C)] 98.5 F (36.9 C) (03/24 0900) Pulse Rate:  [72-86] 81 (03/24 1030) Resp:  [15-18] 15 (03/24 1030) BP: (115-147)/(67-87) 133/80 (03/24 1030) SpO2:  [98 %-99 %] 98 % (03/24 0900) Weight:  [49.9 kg] 49.9 kg (03/24 0900)  Weight change: 3.175 kg Filed Weights   01/12/19 0606 01/12/19 1100 01/13/19 0900  Weight: 51.3 kg 49.9 kg 49.9 kg    Intake/Output: No intake/output data recorded.   Intake/Output this shift:  No intake/output data recorded.  Physical Exam: General: No acute distress  Head: Normocephalic, atraumatic. Moist oral mucosal membranes  Eyes: Anicteric  Neck: Supple, trachea midline  Lungs:  Clear to auscultation, normal effort  Heart: regular  Abdomen:  Soft, nontender, bowel sounds present  Extremities: No peripheral edema.  Neurologic: Awake, alert, oriented  Skin: No lesions  Psych Depressed affect  Access: Left internal jugular PermCath, left arm AVG +thrill and +bruit    Basic Metabolic Panel: Recent Labs  Lab 01/08/19 1611 01/13/19 0932  PHOS 6.5* 3.0    Liver Function Tests: No results for input(s): AST, ALT, ALKPHOS, BILITOT, PROT, ALBUMIN in the last 168 hours. No results for input(s): LIPASE, AMYLASE in the last 168 hours. No results for input(s): AMMONIA in the last 168 hours.  CBC: Recent Labs   Lab 01/08/19 1611 01/12/19 0707  WBC 7.6 7.0  HGB 9.0* 9.0*  HCT 27.4* 27.8*  MCV 88.1 89.7  PLT 186 195    Cardiac Enzymes: No results for input(s): CKTOTAL, CKMB, CKMBINDEX, TROPONINI in the last 168 hours.  BNP: Invalid input(s): POCBNP  CBG: No results for input(s): GLUCAP in the last 168 hours.  Microbiology: Results for orders placed or performed during the hospital encounter of 12/24/18  MRSA PCR Screening     Status: None   Collection Time: 12/27/18  1:20 PM  Result Value Ref Range Status   MRSA by PCR NEGATIVE NEGATIVE Final    Comment:        The GeneXpert MRSA Assay (FDA approved for NASAL specimens only), is one component of a comprehensive MRSA colonization surveillance program. It is not intended to diagnose MRSA infection nor to guide or monitor treatment for MRSA infections. Performed at [facility], [place] [address] [place], [place] [address]     Coagulation Studies: No results for input(s): LABPROT, INR in the last 72 hours.  Urinalysis: No results for input(s): COLORURINE, LABSPEC, PHURINE, GLUCOSEU, HGBUR, BILIRUBINUR, KETONESUR, PROTEINUR, UROBILINOGEN, NITRITE, LEUKOCYTESUR in the last 72 hours.  Invalid input(s): APPERANCEUR    Imaging: No results found.   Medications:   . sodium chloride    . sodium chloride     . amLODipine  5 mg Oral Daily  . apixaban  2.5 mg Oral BID  . aspirin EC  81 mg Oral Daily  . atorvastatin  80 mg Oral QHS  . carvedilol  25 mg  Oral BID WC  . Chlorhexidine Gluconate Cloth  6 each Topical Q0600  . epoetin (EPOGEN/PROCRIT) injection  10,000 Units Intravenous Q T,Th,Sa-HD  . escitalopram  10 mg Oral Daily  . gabapentin  100 mg Oral TID  . hydrALAZINE  25 mg Oral Q8H  . multivitamin  1 tablet Oral Daily  . sevelamer carbonate  1,600 mg Oral TID WC   sodium chloride, sodium chloride, acetaminophen, alteplase, heparin, heparin, hydrOXYzine, lidocaine (PF), lidocaine-prilocaine,  pentafluoroprop-tetrafluoroeth  Assessment/ Plan:  [name] is a 43 y.o. white male with end stage renal disease on hemodialysis, hypertension, depression, aortobifemoral bypass, who was admitted to [facility] on 01/07/2019 for evaluation and treatment of major depressive disorder.  [place] [facility] TTS   1.  ESRD: seen and examined on hemodialysis treatment. Tolerating treatment well.   2.  Anemia chronic kidney disease: hemoglobin 9 - EPO with HD treatment  3.  Secondary hyperparathyroidism with hyperphosphatemia:  - Sevelamer with meals  4.  Hypertension.   Current regimen of amlodipine, carvedilol, hydralazine.      LOS: 6 Brett Small 3/24/202010:59 AM

## 2019-01-13 NOTE — Progress Notes (Signed)
Post HD Assessment   01/13/19 1226  Vital Signs  Pulse Rate 75  Resp 14  BP 120/72  Oxygen Therapy  SpO2 100 %  Post-Hemodialysis Assessment  Rinseback Volume (mL) 250 mL  Dialyzer Clearance Lightly streaked  Duration of HD Treatment -hour(s) 3 hour(s)  Hemodialysis Intake (mL) 500 mL  UF Total -Machine (mL) 2025 mL  Net UF (mL) 1525 mL  Tolerated HD Treatment Yes

## 2019-01-13 NOTE — Progress Notes (Signed)
Pre HD Tx   01/13/19 0900  Hand-Off documentation  Report given to (Full Name) [facility]   Report received from (Full Name) Collier Bullock RN  Vital Signs  Temp 98.5 F (36.9 C)  Temp Source Oral  Pulse Rate 75  Pulse Rate Source Monitor  Resp 16  BP 128/70  BP Location Right Arm  BP Method Automatic  Patient Position (if appropriate) Sitting  Oxygen Therapy  SpO2 98 %  O2 Device Room Air  Pain Assessment  Pain Scale 0-10  Pain Score 6  Pain Type Surgical pain;Other (Comment) (Nerve pain from AV graft placement)  Pain Location Hand (left hand )  Dialysis Weight  Weight 49.9 kg  Type of Weight Pre-Dialysis  Time-Out for Hemodialysis  What Procedure? Hemodialysis  Pt Identifiers(min of two) First/Last Name;MRN/Account#  Correct Site? Yes  Correct Side? Yes  Correct Procedure? Yes  Consents Verified? Yes  Rad Studies Available? N/A  Safety Precautions Reviewed? Yes  Engineer, civil (consulting) Number 4  Station Number 4  UF/Alarm Test Passed  Conductivity: Meter 14  Conductivity: Machine  13.8  pH 7.2  Reverse Osmosis Main   Normal Saline Lot Number [id number]  Dialyzer Lot Number [id number]  Disposable Set Lot Number [id number]  Machine Temperature 98.6 F (37 C)  Musician and Audible Yes  Blood Lines Intact and Secured Yes  Pre Treatment Patient Checks  Vascular access used during treatment Catheter  Patient is receiving dialysis in a chair Yes  Hepatitis B Surface Antigen Results Negative  Date Hepatitis B Surface Antigen Drawn 12/25/18  Hepatitis B Surface Antibody  (<10)  Date Hepatitis B Surface Antibody Drawn 12/25/18  Hemodialysis Consent Verified Yes  Hemodialysis Standing Orders Initiated Yes  ECG (Telemetry) Monitor On Yes  Prime Ordered Normal Saline  Length of  DialysisTreatment -hour(s) 3.5 Hour(s)  Dialysis Treatment Comments Na 140  Dialyzer Elisio 17H NR  Dialysate 2K, 2.5 Ca  Dialysate Flow Ordered 600  Blood Flow Rate Ordered 400  mL/min  Ultrafiltration Goal 1.5 Liters  Pre Treatment Labs Phosphorus  Dialysis Blood Pressure Support Ordered Normal Saline  Education / Care Plan  Dialysis Education Provided Yes  Documented Education in Care Plan Yes  Fistula / Graft Left Upper arm Arteriovenous vein graft  Placement Date/Time: 12/26/18 1024   Placed prior to admission: No  Orientation: Left  Access Location: Upper arm  Access Type: (c) Arteriovenous vein graft  Site Condition No complications  Fistula / Graft Assessment Present;Thrill;Bruit  Drainage Description None  Hemodialysis Catheter Right Subclavian  No Placement Date or Time found.   Placed prior to admission: Yes  Orientation: Right  Access Location: Subclavian  Site Condition No complications  Blue Lumen Status Capped (Central line)  Red Lumen Status Capped (Central line)  Dressing Type Biopatch  Dressing Status Clean;Dry  Interventions New dressing;Dressing changed  Drainage Description None

## 2019-01-13 NOTE — Plan of Care (Signed)
Patient stated to this writer, "I am feeling better, I need to find a place to go when I get out of here."   Problem: Education: Goal: Emotional status will improve Outcome: Progressing Goal: Mental status will improve Outcome: Progressing

## 2019-01-13 NOTE — Progress Notes (Signed)
D - Patient was in the day room upon arrival to the unit. Patient was pleasant during assessment and medication administration. Patient denies SI/HI/AVH. Patient rated his pain 5/10 located in his hand. See MAR. Patient says he has some anxiety and depression but he is feeling better than when he first got here. Patient stated, "I am trying to secure somewhere to live, once I have that I will be ready to go."   A - Patient was compliant with medication administration per MD orders and procedures on the unit. Patient given education. Patient given support and encouragement to be active in his treatment plan. Patient informed to let staff know if there are any issues or problems on the unit.   R - Patient being monitored Q 15 minutes for safety per unit protocol. Patient remains safe on the unit.

## 2019-01-13 NOTE — Progress Notes (Signed)
HD Tx Start   01/13/19 0910  Vital Signs  Pulse Rate 75  Resp 18  BP 126/71  During Hemodialysis Assessment  Blood Flow Rate (mL/min) 400 mL/min  Arterial Pressure (mmHg) -160 mmHg  Venous Pressure (mmHg) 150 mmHg  Transmembrane Pressure (mmHg) 40 mmHg  Ultrafiltration Rate (mL/min) 670 mL/min (686mL per HOUR)  Dialysate Flow Rate (mL/min) 600 ml/min  Conductivity: Machine  14  HD Safety Checks Performed Yes  Dialysis Fluid Bolus Normal Saline  Bolus Amount (mL) 250 mL  Intra-Hemodialysis Comments Tx initiated  Hemodialysis Catheter Right Subclavian  No Placement Date or Time found.   Placed prior to admission: Yes  Orientation: Right  Access Location: Subclavian  Site Condition No complications  Blue Lumen Status Infusing  Red Lumen Status Infusing

## 2019-01-13 NOTE — Progress Notes (Signed)
Pt denies SI. HI, depression and anxiety. Pt says that he did not attend any group therapy this afternoon after dialysis because of his headache. He did get relief but rested.in bed. I talked to him about develping a goal of attending therapy tomorrow. Pt agrees to comply and is hopeful. Collier Bullock RN

## 2019-01-14 LAB — RENAL FUNCTION PANEL
ALBUMIN: 3.6 g/dL (ref 3.5–5.0)
ANION GAP: 9 (ref 5–15)
BUN: 37 mg/dL — ABNORMAL HIGH (ref 6–20)
CO2: 26 mmol/L (ref 22–32)
Calcium: 8.6 mg/dL — ABNORMAL LOW (ref 8.9–10.3)
Chloride: 101 mmol/L (ref 98–111)
Creatinine, Ser: 5.56 mg/dL — ABNORMAL HIGH (ref 0.61–1.24)
GFR calc Af Amer: 13 mL/min — ABNORMAL LOW (ref >60)
GFR calc non Af Amer: 11 mL/min — ABNORMAL LOW (ref >60)
Glucose, Bld: 93 mg/dL (ref 70–99)
POTASSIUM: 4.5 mmol/L (ref 3.5–5.1)
Phosphorus: 2.4 mg/dL — ABNORMAL LOW (ref 2.5–4.6)
Sodium: 136 mmol/L (ref 135–145)

## 2019-01-14 LAB — CBC
HCT: 25.6 % — ABNORMAL LOW (ref 39.0–52.0)
Hemoglobin: 8.3 g/dL — ABNORMAL LOW (ref 13.0–17.0)
MCH: 29.2 pg (ref 26.0–34.0)
MCHC: 32.4 g/dL (ref 30.0–36.0)
MCV: 90.1 fL (ref 80.0–100.0)
NRBC: 0 % (ref 0.0–0.2)
Platelets: 199 10*3/uL (ref 150–400)
RBC: 2.84 MIL/uL — AB (ref 4.22–5.81)
RDW: 14.6 % (ref 11.5–15.5)
WBC: 7 10*3/uL (ref 4.0–10.5)

## 2019-01-14 NOTE — [facility] Group Notes (Signed)
LCSW Group Therapy Note  01/14/2019 12:53 PM  Type of Therapy/Topic:  Group Therapy:  Emotion Regulation  Participation Level:  Did Not Attend   Description of Group:   The purpose of this group is to assist patients in learning to regulate negative emotions and experience positive emotions. Patients will be guided to discuss ways in which they have been vulnerable to their negative emotions. These vulnerabilities will be juxtaposed with experiences of positive emotions or situations, and patients will be challenged to use positive emotions to combat negative ones. Special emphasis will be placed on coping with negative emotions in conflict situations, and patients will process healthy conflict resolution skills.  Therapeutic Goals: 1. Patient will identify two positive emotions or experiences to reflect on in order to balance out negative emotions 2. Patient will label two or more emotions that they find the most difficult to experience 3. Patient will demonstrate positive conflict resolution skills through discussion and/or role plays  Summary of Patient Progress: x   Therapeutic Modalities:   Cognitive Behavioral Therapy Feelings Identification Dialectical Behavioral Therapy   Evalina Field, MSW, LCSW Clinical Social Work 01/14/2019 12:53 PM

## 2019-01-14 NOTE — Tx Team (Signed)
Interdisciplinary Treatment and Diagnostic Plan Update  01/14/2019 Time of Session: 8:30am  Brett Small MRN: [id number]  Principal Diagnosis: Depression  Secondary Diagnoses: Principal Problem:   Depression Active Problems:   End stage renal disease ([place])   Hypertension   Homelessness   Current Medications:  Current Facility-Administered Medications  Medication Dose Route Frequency Provider Last Rate Last Dose  . 0.9 %  sodium chloride infusion  100 mL Intravenous PRN Lateef, Munsoor, MD      . 0.9 %  sodium chloride infusion  100 mL Intravenous PRN Lateef, Munsoor, MD      . acetaminophen (TYLENOL) tablet 650 mg  650 mg Oral Q6H PRN Tennis Ship, MD   650 mg at 01/14/19 0219  . alteplase (CATHFLO ACTIVASE) injection 2 mg  2 mg Intracatheter Once PRN Lateef, Munsoor, MD      . amLODipine (NORVASC) tablet 5 mg  5 mg Oral Daily Tennis Ship, MD   5 mg at 01/14/19 0803  . apixaban (ELIQUIS) tablet 2.5 mg  2.5 mg Oral BID Clapacs, Madie Reno, MD   2.5 mg at 01/14/19 0804  . aspirin EC tablet 81 mg  81 mg Oral Daily Tennis Ship, MD   81 mg at 01/14/19 0803  . atorvastatin (LIPITOR) tablet 80 mg  80 mg Oral QHS Tennis Ship, MD   80 mg at 01/13/19 2220  . carvedilol (COREG) tablet 25 mg  25 mg Oral BID WC Tennis Ship, MD   25 mg at 01/14/19 0804  . Chlorhexidine Gluconate Cloth 2 % PADS 6 each  6 each Topical Q0600 Holley Raring, Munsoor, MD   6 each at 01/11/19 0619  . epoetin alfa (EPOGEN,PROCRIT) injection 10,000 Units  10,000 Units Intravenous Q T,Th,Sa-HD Holley Raring, Munsoor, MD   10,000 Units at 01/13/19 1041  . escitalopram (LEXAPRO) tablet 10 mg  10 mg Oral Daily Tennis Ship, MD   10 mg at 01/14/19 0803  . gabapentin (NEURONTIN) capsule 100 mg  100 mg Oral TID Clapacs, Madie Reno, MD   100 mg at 01/14/19 0802  . heparin injection 1,000 Units  1,000 Units Dialysis PRN Lateef, Munsoor, MD      . heparin injection 2,000 Units  2,000 Units Dialysis PRN Roney Jaffe, MD      .  hydrALAZINE (APRESOLINE) tablet 25 mg  25 mg Oral Q8H O'Neal, Sarita, MD   25 mg at 01/14/19 3500  . hydrOXYzine (ATARAX/VISTARIL) tablet 25 mg  25 mg Oral TID PRN Tennis Ship, MD   25 mg at 01/13/19 2220  . lidocaine (PF) (XYLOCAINE) 1 % injection 5 mL  5 mL Intradermal PRN Lateef, Munsoor, MD      . lidocaine-prilocaine (EMLA) cream 1 application  1 application Topical PRN Lateef, Munsoor, MD      . multivitamin (RENA-VIT) tablet 1 tablet  1 tablet Oral Daily Tennis Ship, MD   1 tablet at 01/14/19 0805  . pentafluoroprop-tetrafluoroeth (GEBAUERS) aerosol 1 application  1 application Topical PRN Lateef, Munsoor, MD      . sevelamer carbonate (RENVELA) tablet 1,600 mg  1,600 mg Oral TID WC Tennis Ship, MD   1,600 mg at 01/14/19 0807   PTA Medications: Medications Prior to Admission  Medication Sig Dispense Refill Last Dose  . acetaminophen (TYLENOL) 325 MG tablet Take 650 mg by mouth every 6 (six) hours as needed for mild pain or headache.   Past Week at Unknown time  . amLODipine (NORVASC) 5 MG tablet Take 5 mg by mouth daily. hold if  Systolic reading is <326   01/07/2019 at Unknown time  . apixaban (ELIQUIS) 2.5 MG TABS tablet Take 1 tablet (2.5 mg total) by mouth 2 (two) times daily. 60 tablet 0 01/07/2019 at Unknown time  . aspirin EC 81 MG tablet Take 81 mg by mouth daily.   01/07/2019 at Unknown time  . atorvastatin (LIPITOR) 80 MG tablet Take 80 mg by mouth at bedtime.   01/06/2019 at Unknown time  . B Complex-C-Folic Acid (NEPHRO-VITE PO) Take 1 tablet by mouth daily.   01/07/2019 at Unknown time  . carvedilol (COREG) 25 MG tablet Take 25 mg by mouth 2 (two) times daily with a meal.   01/07/2019 at Unknown time  . escitalopram (LEXAPRO) 10 MG tablet Take 1 tablet (10 mg total) by mouth daily. 30 tablet 1 01/07/2019 at Unknown time  . gabapentin (NEURONTIN) 300 MG capsule Take 300 mg by mouth 3 (three) times daily.   01/07/2019 at Unknown time  . hydrALAZINE (APRESOLINE) 25 MG tablet Take  25 mg by mouth every 8 (eight) hours.   01/07/2019 at Unknown time  . hydrOXYzine (ATARAX/VISTARIL) 25 MG tablet Take 25 mg by mouth 3 (three) times daily as needed for anxiety.   Past Week at Unknown time  . sevelamer carbonate (RENVELA) 800 MG tablet Take 2 tablets (1,600 mg total) by mouth 3 (three) times daily with meals. 180 tablet 0 01/07/2019 at Unknown time    Patient Stressors: Health problems Medication change or noncompliance  Patient Strengths: Ability for insight Communication skills  Treatment Modalities: Medication Management, Group therapy, Case management,  1 to 1 session with clinician, Psychoeducation, Recreational therapy.   Physician Treatment Plan for Primary Diagnosis: Depression Long Term Goal(s): Improvement in symptoms so as ready for discharge Improvement in symptoms so as ready for discharge   Short Term Goals: Ability to verbalize feelings will improve Ability to disclose and discuss suicidal ideas Compliance with prescribed medications will improve  Medication Management: Evaluate patient's response, side effects, and tolerance of medication regimen.  Therapeutic Interventions: 1 to 1 sessions, Unit Group sessions and Medication administration.  Evaluation of Outcomes: Progressing  Physician Treatment Plan for Secondary Diagnosis: Principal Problem:   Depression Active Problems:   End stage renal disease ([place])   Hypertension   Homelessness  Long Term Goal(s): Improvement in symptoms so as ready for discharge Improvement in symptoms so as ready for discharge   Short Term Goals: Ability to verbalize feelings will improve Ability to disclose and discuss suicidal ideas Compliance with prescribed medications will improve     Medication Management: Evaluate patient's response, side effects, and tolerance of medication regimen.  Therapeutic Interventions: 1 to 1 sessions, Unit Group sessions and Medication administration.  Evaluation of Outcomes:  Progressing   RN Treatment Plan for Primary Diagnosis: Depression Long Term Goal(s): Knowledge of disease and therapeutic regimen to maintain health will improve  Short Term Goals: Ability to demonstrate self-control, Ability to participate in decision making will improve, Ability to verbalize feelings will improve and Ability to disclose and discuss suicidal ideas  Medication Management: RN will administer medications as ordered by provider, will assess and evaluate patient's response and provide education to patient for prescribed medication. RN will report any adverse and/or side effects to prescribing provider.  Therapeutic Interventions: 1 on 1 counseling sessions, Psychoeducation, Medication administration, Evaluate responses to treatment, Monitor vital signs and CBGs as ordered, Perform/monitor CIWA, COWS, AIMS and Fall Risk screenings as ordered, Perform wound care treatments as ordered.  Evaluation  of Outcomes: Progressing   LCSW Treatment Plan for Primary Diagnosis: Depression Long Term Goal(s): Safe transition to appropriate next level of care at discharge, Engage patient in therapeutic group addressing interpersonal concerns.  Short Term Goals: Engage patient in aftercare planning with referrals and resources, Increase social support, Increase ability to appropriately verbalize feelings, Increase emotional regulation and Facilitate acceptance of mental health diagnosis and concerns  Therapeutic Interventions: Assess for all discharge needs, 1 to 1 time with Social worker, Explore available resources and support systems, Assess for adequacy in community support network, Educate family and significant other(s) on suicide prevention, Complete Psychosocial Assessment, Interpersonal group therapy.  Evaluation of Outcomes: Progressing   Progress in Treatment: Attending groups: No. Participating in groups: No. Taking medication as prescribed: Yes. Toleration medication:  Yes. Family/Significant other contact made: Yes, individual(s) contacted:  SPE completed with the patient's father.  Patient understands diagnosis: Yes. Discussing patient identified problems/goals with staff: Yes. Medical problems stabilized or resolved: Yes. Denies suicidal/homicidal ideation: Yes. Issues/concerns per patient self-inventory: No. Other: none  New problem(s) identified: No, Describe:  none  New Short Term/Long Term Goal(s): elimination of symptoms of psychosis, medication management for mood stabilization; elimination of SI thoughts; development of comprehensive mental wellness  Patient Goals: "if I had a place to live I'd be ok"  Discharge Plan or Barriers: Pt is currently homeless.  CSW confirmed with the patient's father that at this time the patient is not allowed to come to his home.  Patient reports that he wants to catch a train or bus to [place], however, seems indifferent to making plans for his dialysis.    Reason for Continuation of Hospitalization: Anxiety Depression Medical Issues Medication stabilization Suicidal ideation  Estimated Length of Stay: 1-5 days  Recreational Therapy: Patient Stressors: N/A Patient Goal: Patient will engage in groups without prompting or encouragement from LRT x3 group sessions within 5 recreation therapy group sessions  Attendees: Patient: Brett Small 01/14/2019 11:06 AM  Physician: Dr. Weber Cooks, MD 01/14/2019 11:06 AM  Nursing:  01/14/2019 11:06 AM  RN Care Manager: 01/14/2019 11:06 AM  Social Worker: [name], LCSW 01/14/2019 11:06 AM  Recreational Therapist: 01/14/2019 11:06 AM  Other:  01/14/2019 11:06 AM  Other:  01/14/2019 11:06 AM  Other: 01/14/2019 11:06 AM    Scribe for Treatment Team: Rozann Lesches, LCSW 01/14/2019 11:06 AM

## 2019-01-14 NOTE — Progress Notes (Signed)
D - Patient was in the day room upon arrival to the unit. Patient was pleasant during assessment and medication administration. Patient denies SI/HI/AVH. Patient says he has some anxiety but that was because he is trying to secure placement with his daughter.   A - Patient was compliant with medication administration per MD orders and procedures on the unit. Patient given education. Patient given support and encouragement to be active in his treatment plan. Patient informed to let staff know if there are any issues or problems on the unit.   R - Patient being monitored Q 15 minutes for safety per unit protocol. Patient remains safe on the unit.

## 2019-01-14 NOTE — Plan of Care (Signed)
Patient stated to this writer that he continues to feel better. Patient is trying to secure placement with his daughter.   Problem: Education: Goal: Emotional status will improve Outcome: Progressing Goal: Mental status will improve Outcome: Progressing

## 2019-01-14 NOTE — Plan of Care (Signed)
Pt was educated on today's plan, expectations and goals. Pt agrees to progress. Pt complains os "some anxiety" and rates it a "three". Pt denies depression, SI and HI. Pt is verbalizing a plan for d/c. Pt is wout injury and non violent. Collier Bullock RN Problem: Education: Goal: Knowledge of [facility] Education information/materials will improve Outcome: Progressing Goal: Emotional status will improve Outcome: Progressing Goal: Mental status will improve Outcome: Progressing Goal: Verbalization of understanding the information provided will improve Outcome: Progressing   Problem: Safety: Goal: Periods of time without injury will increase Outcome: Progressing   Problem: Education: Goal: Utilization of techniques to improve thought processes will improve Outcome: Progressing Goal: Knowledge of the prescribed therapeutic regimen will improve Outcome: Progressing

## 2019-01-14 NOTE — [facility] Group Notes (Signed)
[facility] Group Notes:  (Nursing/MHT/Case Management/Adjunct)  Date:  01/14/2019  Time:  9:56 PM  Type of Therapy:  Group Therapy  Participation Level:  Active  Participation Quality:  Appropriate  Affect:  Appropriate  Cognitive:  Alert  Insight:  Good  Engagement in Group:  Engaged  Modes of Intervention:  Support  Summary of Progress/Problems:  Brett Small 01/14/2019, 9:56 PM

## 2019-01-14 NOTE — Progress Notes (Signed)
Pt denies SI, HI and anxiety is at a "six" and depression is a "zero". He is calm, cooperative and non violent toward himself and others. Pt has gone to group today.  Collier Bullock RN

## 2019-01-14 NOTE — [facility] Counselor (Signed)
CSW spoke with the patient regarding aftercare plans.  Patient reports that he can not return to his fathers home due to father's concenrs that the patient may have been exposed to Covid-19 during his hospital stay.   CSW inquired about if pt had Medicaid and patient reports that he does.  CSW discussed the possibility of a group home for additional support and housing including getting to and from his dialysis appointments.  Pt reports that he doesn't like the idea of a group home.   CSW spoke with the patient regarding getting a hotel room and patient replied "I get my check in 2 or 3 days and I may get one then".   Patient reports "I really just want to get on a plane, bus, train something and go to [place]."  CSW spoke about concerns of patient getting his dialysis treatment the plan and pt replied "I'll be in the city".    Pt was asamant that he have access to his phone so he can message his ex-wife about going to [place]. CSW informed that pts on the unit do not have access to their phones and the only opportunity that they can get is to obtain phone numbers.    CSW informed pt she will follow up in one hour to see what the patient was planning.   [name], MSW, LCSW 01/14/2019 9:50 AM

## 2019-01-14 NOTE — Progress Notes (Signed)
Recreation Therapy Notes   Date: 01/14/2019  Time: 9:30 am  Location: Craft Room  Behavioral response: Appropriate  Intervention Topic: Emotions  Discussion/Intervention:  Group content on today was focused on emotions. The group identified what emotions are and why it is important to have emotions. Patients expressed some positive and negative emotions. Individuals gave some past experiences on how they normally dealt with emotions in the past. The group described some positive ways to deal with emotions in the future. Patients participated in the intervention "Name the Jerl Santos" where individuals were given a chance to experience different emotions.  Clinical Observations/Feedback:  Patient came to group and defined emotions as the way you feel. He identified he normally feel sad and pitiful a lot. Participant explained that his emotions normally come from his situation. Individual was social with peers and staff while participating in group.  Laquanda Bick LRT/CTRS          Quynn Vilchis 01/14/2019 11:09 AM

## 2019-01-14 NOTE — Progress Notes (Signed)
[facility] MD Progress Note  01/14/2019 4:40 PM Brett Small  MRN:  [id number] Subjective: Follow-up note for this patient with depressed mood and multiple medical problems and multiple social issues.  Patient had a complaint today of numbness on one side of his arm.  Examined arm.  Regular blood flow observed no sign of any ischemia.  Mood is stated as being okay.  Denies suicidal thoughts.  Denies psychosis.  Patient claims that he spoke to his father and that his father told him that he could not stay with him.  Social work still would like to confirm that.  Patient is somewhat passively resistant to most discharge options coming up currently with a somewhat unrealistic option that he is going to travel off to [place].  Tolerating medicine well.  Tolerating dialysis fine.  Interacts with peers and staff without difficulty Principal Problem: Depression Diagnosis: Principal Problem:   Depression Active Problems:   End stage renal disease ([place])   Hypertension   Homelessness  Total Time spent with patient: 30 minutes  Past Psychiatric History: No past psychiatric history  Past Medical History:  Past Medical History:  Diagnosis Date  . Hypertension   . Renal disorder     Past Surgical History:  Procedure Laterality Date  . AORTA - FEMORAL ARTERY BYPASS GRAFT    . AV FISTULA PLACEMENT Left 12/26/2018   Procedure: INSERTION OF GORE STRETCH VASCULAR 4-7MM X  45CM IN LEFT UPPER ARM;  Surgeon: Marty Heck, MD;  Location: [facility] OR;  Service: Vascular;  Laterality: Left;   Family History:  Family History  Problem Relation Age of Onset  . Hypertension Other   . Diabetes Other   . Clotting disorder Father    Family Psychiatric  History: See previous Social History:  Social History   Substance and Sexual Activity  Alcohol Use Not Currently     Social History   Substance and Sexual Activity  Drug Use Never    Social History   Socioeconomic History  . Marital status: Single     Spouse name: Not on file  . Number of children: Not on file  . Years of education: Not on file  . Highest education level: Not on file  Occupational History  . Not on file  Social Needs  . Financial resource strain: Not on file  . Food insecurity:    Worry: Not on file    Inability: Not on file  . Transportation needs:    Medical: Not on file    Non-medical: Not on file  Tobacco Use  . Smoking status: Former Smoker    Last attempt to quit: 06/26/2018    Years since quitting: 0.5  . Smokeless tobacco: Never Used  . Tobacco comment: smoked for 30 years   Substance and Sexual Activity  . Alcohol use: Not Currently  . Drug use: Never  . Sexual activity: Not on file  Lifestyle  . Physical activity:    Days per week: Not on file    Minutes per session: Not on file  . Stress: Not on file  Relationships  . Social connections:    Talks on phone: Not on file    Gets together: Not on file    Attends religious service: Not on file    Active member of club or organization: Not on file    Attends meetings of clubs or organizations: Not on file    Relationship status: Not on file  Other Topics Concern  . Not  on file  Social History Narrative  . Not on file   Additional Social History:                         Sleep: Fair  Appetite:  Fair  Current Medications: Current Facility-Administered Medications  Medication Dose Route Frequency Provider Last Rate Last Dose  . 0.9 %  sodium chloride infusion  100 mL Intravenous PRN Lateef, Munsoor, MD      . 0.9 %  sodium chloride infusion  100 mL Intravenous PRN Lateef, Munsoor, MD      . acetaminophen (TYLENOL) tablet 650 mg  650 mg Oral Q6H PRN Tennis Ship, MD   650 mg at 01/14/19 0219  . alteplase (CATHFLO ACTIVASE) injection 2 mg  2 mg Intracatheter Once PRN Lateef, Munsoor, MD      . amLODipine (NORVASC) tablet 5 mg  5 mg Oral Daily Tennis Ship, MD   5 mg at 01/14/19 0803  . apixaban (ELIQUIS) tablet 2.5 mg  2.5 mg  Oral BID Clapacs, Madie Reno, MD   2.5 mg at 01/14/19 0804  . aspirin EC tablet 81 mg  81 mg Oral Daily Tennis Ship, MD   81 mg at 01/14/19 0803  . atorvastatin (LIPITOR) tablet 80 mg  80 mg Oral QHS Tennis Ship, MD   80 mg at 01/13/19 2220  . carvedilol (COREG) tablet 25 mg  25 mg Oral BID WC Tennis Ship, MD   25 mg at 01/14/19 0804  . Chlorhexidine Gluconate Cloth 2 % PADS 6 each  6 each Topical Q0600 Holley Raring, Munsoor, MD   6 each at 01/11/19 0619  . epoetin alfa (EPOGEN,PROCRIT) injection 10,000 Units  10,000 Units Intravenous Q T,Th,Sa-HD Holley Raring, Munsoor, MD   10,000 Units at 01/13/19 1041  . escitalopram (LEXAPRO) tablet 10 mg  10 mg Oral Daily Tennis Ship, MD   10 mg at 01/14/19 0803  . gabapentin (NEURONTIN) capsule 100 mg  100 mg Oral TID Clapacs, John T, MD   100 mg at 01/14/19 1151  . heparin injection 1,000 Units  1,000 Units Dialysis PRN Lateef, Munsoor, MD      . heparin injection 2,000 Units  2,000 Units Dialysis PRN Roney Jaffe, MD      . hydrALAZINE (APRESOLINE) tablet 25 mg  25 mg Oral Q8H O'Neal, Sarita, MD   25 mg at 01/14/19 0354  . hydrOXYzine (ATARAX/VISTARIL) tablet 25 mg  25 mg Oral TID PRN Tennis Ship, MD   25 mg at 01/13/19 2220  . lidocaine (PF) (XYLOCAINE) 1 % injection 5 mL  5 mL Intradermal PRN Lateef, Munsoor, MD      . lidocaine-prilocaine (EMLA) cream 1 application  1 application Topical PRN Lateef, Munsoor, MD      . multivitamin (RENA-VIT) tablet 1 tablet  1 tablet Oral Daily Tennis Ship, MD   1 tablet at 01/14/19 0805  . pentafluoroprop-tetrafluoroeth (GEBAUERS) aerosol 1 application  1 application Topical PRN Lateef, Munsoor, MD      . sevelamer carbonate (RENVELA) tablet 1,600 mg  1,600 mg Oral TID WC Tennis Ship, MD   1,600 mg at 01/14/19 1151    Lab Results:  Results for orders placed or performed during the hospital encounter of 01/07/19 (from the past 48 hour(s))  Phosphorus     Status: None   Collection Time: 01/13/19  9:32 AM   Result Value Ref Range   Phosphorus 3.0 2.5 - 4.6 mg/dL    Comment: Performed at [employer]  [facility], [address] [place], [place] [address]  Renal function panel     Status: Abnormal   Collection Time: 01/14/19  6:51 AM  Result Value Ref Range   Sodium 136 135 - 145 mmol/L   Potassium 4.5 3.5 - 5.1 mmol/L   Chloride 101 98 - 111 mmol/L   CO2 26 22 - 32 mmol/L   Glucose, Bld 93 70 - 99 mg/dL   BUN 37 (H) 6 - 20 mg/dL   Creatinine, Ser 5.56 (H) 0.61 - 1.24 mg/dL   Calcium 8.6 (L) 8.9 - 10.3 mg/dL   Phosphorus 2.4 (L) 2.5 - 4.6 mg/dL   Albumin 3.6 3.5 - 5.0 g/dL   GFR calc non Af Amer 11 (L) >60 mL/min   GFR calc Af Amer 13 (L) >60 mL/min   Anion gap 9 5 - 15    Comment: Performed at [facility], [address] [place], [place] [address]  CBC     Status: Abnormal   Collection Time: 01/14/19  6:51 AM  Result Value Ref Range   WBC 7.0 4.0 - 10.5 K/uL   RBC 2.84 (L) 4.22 - 5.81 MIL/uL   Hemoglobin 8.3 (L) 13.0 - 17.0 g/dL   HCT 25.6 (L) 39.0 - 52.0 %   MCV 90.1 80.0 - 100.0 fL   MCH 29.2 26.0 - 34.0 pg   MCHC 32.4 30.0 - 36.0 g/dL   RDW 14.6 11.5 - 15.5 %   Platelets 199 150 - 400 K/uL   nRBC 0.0 0.0 - 0.2 %    Comment: Performed at [facility], [address] [place],  [address]    Blood Alcohol level:  No results found for: [facility]  Metabolic Disorder Labs: Lab Results  Component Value Date   HGBA1C 5.5 01/08/2019   MPG 111.15 01/08/2019   No results found for: PROLACTIN Lab Results  Component Value Date   CHOL 101 01/08/2019   TRIG 156 (H) 01/08/2019   HDL 33 (L) 01/08/2019   CHOLHDL 3.1 01/08/2019   VLDL 31 01/08/2019   LDLCALC 37 01/08/2019    Physical Findings: AIMS:  , ,  ,  ,    CIWA:    COWS:     Musculoskeletal: Strength & Muscle Tone: within normal limits Gait & Station: normal Patient leans: N/A  Psychiatric Specialty Exam: Physical Exam  Nursing note and vitals reviewed. Constitutional: He appears  well-developed and well-nourished.  HENT:  Head: Normocephalic and atraumatic.  Eyes: Pupils are equal, round, and reactive to light. Conjunctivae are normal.  Neck: Normal range of motion.  Cardiovascular: Regular rhythm and normal heart sounds.  Respiratory: Effort normal. No respiratory distress.  GI: Soft.  Musculoskeletal: Normal range of motion.  Neurological: He is alert.  Skin: Skin is warm and dry.  Psychiatric: He has a normal mood and affect. His behavior is normal. Judgment and thought content normal.    Review of Systems  Constitutional: Negative.   HENT: Negative.   Eyes: Negative.   Respiratory: Negative.   Cardiovascular: Negative.   Gastrointestinal: Negative.   Musculoskeletal: Negative.   Skin: Negative.   Neurological: Negative.   Psychiatric/Behavioral: Negative.     Blood pressure 123/77, pulse 71, temperature 98.2 F (36.8 C), temperature source Oral, resp. rate 17, height 5\' 1"  (1.549 m), weight 47.5 kg, SpO2 100 %.Body mass index is 19.79 kg/m.  General Appearance: Casual  Eye Contact:  Fair  Speech:  Clear and Coherent  Volume:  Normal  Mood:  Euthymic  Affect:  Constricted  Thought Process:  Goal Directed  Orientation:  Full (Time, Place, and Person)  Thought Content:  Logical  Suicidal Thoughts:  No  Homicidal Thoughts:  No  Memory:  Immediate;   Fair Recent;   Fair Remote;   Fair  Judgement:  Fair  Insight:  Fair  Psychomotor Activity:  Normal  Concentration:  Concentration: Fair  Recall:  [employer] of Knowledge:  Fair  Language:  Fair  Akathisia:  No  Handed:  Right  AIMS (if indicated):     Assets:  Communication Skills Desire for Improvement Financial Resources/Insurance  ADL's:  Intact  Cognition:  WNL  Sleep:  Number of Hours: 5.25     Treatment Plan Summary: Daily contact with patient to assess and evaluate symptoms and progress in treatment, Medication management and Plan Patient has passively resisted attempts at  finding a reasonable living situation however in the current climate we have no shelters we can refer him to and the patient will need to have dialysis set up.  We are somewhat over the barrel with him.  Patient is quite happy to stay here indefinitely as far as I can tell.  No change to medication or treatment plan.  Treatment team continuing to work on options.  Alethia Berthold, MD 01/14/2019, 4:40 PM

## 2019-01-15 DIAGNOSIS — F329 Major depressive disorder, single episode, unspecified: Secondary | ICD-10-CM

## 2019-01-15 DIAGNOSIS — M79642 Pain in left hand: Secondary | ICD-10-CM

## 2019-01-15 DIAGNOSIS — N186 End stage renal disease: Secondary | ICD-10-CM

## 2019-01-15 DIAGNOSIS — I120 Hypertensive chronic kidney disease with stage 5 chronic kidney disease or end stage renal disease: Secondary | ICD-10-CM

## 2019-01-15 DIAGNOSIS — Z992 Dependence on renal dialysis: Secondary | ICD-10-CM

## 2019-01-15 MED ORDER — IBUPROFEN 200 MG PO TABS
400.0000 mg | ORAL_TABLET | Freq: Four times a day (QID) | ORAL | Status: DC | PRN
  Administered 2019-01-15 – 2019-01-24 (×12): 400 mg via ORAL
  Filled 2019-01-15 (×14): qty 2

## 2019-01-15 NOTE — Progress Notes (Deleted)
Pre HD Tx  

## 2019-01-15 NOTE — [facility] Group Notes (Signed)
LCSW Group Therapy Note  01/15/2019 1:00 PM  Type of Therapy/Topic:  Group Therapy:  Balance in Life  Participation Level:  Did Not Attend  Description of Group:    This group will address the concept of balance and how it feels and looks when one is unbalanced. Patients will be encouraged to process areas in their lives that are out of balance and identify reasons for remaining unbalanced. Facilitators will guide patients in utilizing problem-solving interventions to address and correct the stressor making their life unbalanced. Understanding and applying boundaries will be explored and addressed for obtaining and maintaining a balanced life. Patients will be encouraged to explore ways to assertively make their unbalanced needs known to significant others in their lives, using other group members and facilitator for support and feedback.  Therapeutic Goals: 1. Patient will identify two or more emotions or situations they have that consume much of in their lives. 2. Patient will identify signs/triggers that life has become out of balance:  3. Patient will identify two ways to set boundaries in order to achieve balance in their lives:  4. Patient will demonstrate ability to communicate their needs through discussion and/or role plays  Summary of Patient Progress: X  Therapeutic Modalities:   Cognitive Behavioral Therapy Solution-Focused Therapy Assertiveness Training  [name] MSW, LCSW 01/15/2019 2:15 PM

## 2019-01-15 NOTE — Progress Notes (Deleted)
Pre HD Tx  Swelling noted on left upper arm AV graft, pt reports pain/numbness. Vascular to see patient for consult.    01/15/19 1025  Vital Signs  Temp 98.5 F (36.9 C)  Temp Source Oral  Pulse Rate 87  Pulse Rate Source Monitor  Resp 14  BP 135/82  BP Location Right Arm  BP Method Automatic  Patient Position (if appropriate) Sitting  Oxygen Therapy  SpO2 99 %  O2 Device Room Air  Pain Assessment  Pain Scale 0-10  Pain Score 0  Dialysis Weight  Weight 49.7 kg  Type of Weight Pre-Dialysis  Time-Out for Hemodialysis  What Procedure? Hemodialysis   Pt Identifiers(min of two) First/Last Name;MRN/Account#  Correct Site? Yes  Correct Side? Yes  Correct Procedure? Yes  Consents Verified? Yes  Rad Studies Available? N/A  Safety Precautions Reviewed? Yes  Engineer, civil (consulting) Number 4  Station Number 4  UF/Alarm Test Passed  Conductivity: Meter 14  Conductivity: Machine  14  pH 7.4  Reverse Osmosis Main  Normal Saline Lot Number [id number]  Dialyzer Lot Number [id number]  Disposable Set Lot Number [id number]  Machine Temperature 98.6 F (37 C)  Musician and Audible Yes  Blood Lines Intact and Secured Yes  Pre Treatment Patient Checks  Vascular access used during treatment Catheter  Patient is receiving dialysis in a chair Yes  Hepatitis B Surface Antigen Results Negative  Date Hepatitis B Surface Antigen Drawn 12/25/18  Hepatitis B Surface Antibody  (<10)  Date Hepatitis B Surface Antibody Drawn 12/25/18  Hemodialysis Consent Verified Yes  Hemodialysis Standing Orders Initiated Yes  ECG (Telemetry) Monitor On Yes  Prime Ordered Normal Saline  Length of  DialysisTreatment -hour(s) 3 Hour(s)  Dialysis Treatment Comments Na 140  Dialyzer Elisio 17H NR  Dialysate 3K, 2.5 Ca  Dialysate Flow Ordered 600  Blood Flow Rate Ordered 400 mL/min  Ultrafiltration Goal 1.5 Liters  Dialysis Blood Pressure Support Ordered Normal Saline  Education / Care Plan  Dialysis  Education Provided Yes  Documented Education in Care Plan Yes  Fistula / Graft Left Upper arm Arteriovenous vein graft  Placement Date/Time: 12/26/18 1024   Placed prior to admission: No  Orientation: Left  Access Location: Upper arm  Access Type: (c) Arteriovenous vein graft  Site Condition No complications  Fistula / Graft Assessment Present;Thrill;Bruit  Status Accessed  Needle Size 15  Drainage Description None  Hemodialysis Catheter Right Subclavian  No Placement Date or Time found.   Placed prior to admission: Yes  Orientation: Right  Access Location: Subclavian  Site Condition No complications  Blue Lumen Status Flushed;Saline locked;Capped (Central line);Heparin locked  Red Lumen Status Flushed;Saline locked;Capped (Central line);Heparin locked  Dressing Status Clean;Dry;Intact  Drainage Description None    01/15/19 1025  Vital Signs  Temp 98.5 F (36.9 C)  Temp Source Oral  Pulse Rate 87  Pulse Rate Source Monitor  Resp 14  BP 135/82  BP Location Right Arm  BP Method Automatic  Patient Position (if appropriate) Sitting  Oxygen Therapy  SpO2 99 %  O2 Device Room Air  Pain Assessment  Pain Scale 0-10  Pain Score 0  Dialysis Weight  Weight 49.7 kg  Type of Weight Pre-Dialysis  Time-Out for Hemodialysis  What Procedure? Hemodialysis   Pt Identifiers(min of two) First/Last Name;MRN/Account#  Correct Site? Yes  Correct Side? Yes  Correct Procedure? Yes  Consents Verified? Yes  Rad Studies Available? N/A  Safety Precautions Reviewed? Yes  [employer]  Machine Number 4  Station Number 4  UF/Alarm Test Passed  Conductivity: Meter 14  Conductivity: Machine  14  pH 7.4  Reverse Osmosis Main  Normal Saline Lot Number [id number]  Dialyzer Lot Number [id number]  Disposable Set Lot Number [id number]  Machine Temperature 98.6 F (37 C)  Musician and Audible Yes  Blood Lines Intact and Secured Yes  Pre Treatment Patient Checks  Vascular access used during  treatment Catheter  Patient is receiving dialysis in a chair Yes  Hepatitis B Surface Antigen Results Negative  Date Hepatitis B Surface Antigen Drawn 12/25/18  Hepatitis B Surface Antibody  (<10)  Date Hepatitis B Surface Antibody Drawn 12/25/18  Hemodialysis Consent Verified Yes  Hemodialysis Standing Orders Initiated Yes  ECG (Telemetry) Monitor On Yes  Prime Ordered Normal Saline  Length of  DialysisTreatment -hour(s) 3 Hour(s)  Dialysis Treatment Comments Na 140  Dialyzer Elisio 17H NR  Dialysate 3K, 2.5 Ca  Dialysate Flow Ordered 600  Blood Flow Rate Ordered 400 mL/min  Ultrafiltration Goal 1.5 Liters  Dialysis Blood Pressure Support Ordered Normal Saline  Education / Care Plan  Dialysis Education Provided Yes  Documented Education in Care Plan Yes  Fistula / Graft Left Upper arm Arteriovenous vein graft  Placement Date/Time: 12/26/18 1024   Placed prior to admission: No  Orientation: Left  Access Location: Upper arm  Access Type: (c) Arteriovenous vein graft  Site Condition No complications  Fistula / Graft Assessment Present;Thrill;Bruit  Status Accessed  Needle Size 15  Drainage Description None  Hemodialysis Catheter Right Subclavian  No Placement Date or Time found.   Placed prior to admission: Yes  Orientation: Right  Access Location: Subclavian  Site Condition No complications  Blue Lumen Status Flushed;Saline locked;Capped (Central line);Heparin locked  Red Lumen Status Flushed;Saline locked;Capped (Central line);Heparin locked  Dressing Status Clean;Dry;Intact  Drainage Description None

## 2019-01-15 NOTE — Progress Notes (Signed)
Post HD Tx   01/15/19 1325  Vital Signs  Temp 98.7 F (37.1 C)  Temp Source Oral  Pulse Rate 88  Pulse Rate Source Monitor  Resp 15  BP 140/82  BP Location Right Arm  BP Method Automatic  Patient Position (if appropriate) Sitting  Oxygen Therapy  SpO2 98 %  O2 Device Room Air  Pain Assessment  Pain Scale 0-10  Pain Score 5  Pain Type Surgical pain;Neuropathic pain  Pain Location Arm  Pain Orientation Left  Pain Onset Progressive  Pain Intervention(s) RN made aware;Repositioned  Dialysis Weight  Weight 48.7 kg  Type of Weight Post-Dialysis  Post-Hemodialysis Assessment  Rinseback Volume (mL) 250 mL  KECN 68.5 V  Dialyzer Clearance Lightly streaked  Duration of HD Treatment -hour(s) 3 hour(s)  Hemodialysis Intake (mL) 500 mL  UF Total -Machine (mL) 1500 mL  Net UF (mL) 1000 mL  Tolerated HD Treatment Yes  Fistula / Graft Left Upper arm Arteriovenous vein graft  Placement Date/Time: 12/26/18 1024   Placed prior to admission: No  Orientation: Left  Access Location: Upper arm  Access Type: (c) Arteriovenous vein graft  Site Condition No complications  Fistula / Graft Assessment Present;Thrill;Bruit;Other (Comment) (Edema, vascular surgery evaluated)

## 2019-01-15 NOTE — Progress Notes (Signed)
Post HD Tx   01/15/19 1335  Neurological  Level of Consciousness Alert  Orientation Level Oriented X4  Respiratory  Respiratory Pattern Regular  Bilateral Breath Sounds Clear;Diminished  Cardiac  Pulse Regular  Vascular  R Radial Pulse +2  Integumentary  Integumentary (WDL) X  Skin Color Appropriate for ethnicity  Musculoskeletal  Musculoskeletal (WDL) WDL  GU Assessment  Genitourinary (WDL) X (HD Pt)  Psychosocial  Psychosocial (WDL) WDL  Emotional support given Given to patient

## 2019-01-15 NOTE — Progress Notes (Signed)
HD Tx End   01/15/19 1315  Vital Signs  Pulse Rate 87  Resp 15  BP 138/82  Oxygen Therapy  SpO2 98 %  During Hemodialysis Assessment  Blood Flow Rate (mL/min) 200 mL/min  Arterial Pressure (mmHg) -80 mmHg  Venous Pressure (mmHg) 40 mmHg  Transmembrane Pressure (mmHg) 20 mmHg  Ultrafiltration Rate (mL/min) 500 mL/min  Dialysate Flow Rate (mL/min) 600 ml/min  Conductivity: Machine  14  HD Safety Checks Performed Yes  Dialysis Fluid Bolus Normal Saline  Bolus Amount (mL) 250 mL  Intra-Hemodialysis Comments Tolerated well;Tx completed

## 2019-01-15 NOTE — Progress Notes (Signed)
Pre HD Tx    Swelling noted on left upper arm AV graft, pt reports pain/numbness. Vascular to see patient for consult    01/15/19 1025  Hand-Off documentation  Report given to (Full Name) Trellis Paganini RN  Report received from (Full Name) Polly Cobia RN   Vital Signs  Temp 98.5 F (36.9 C)  Temp Source Oral  Pulse Rate 87  Pulse Rate Source Monitor  Resp 14  BP 135/82  BP Location Right Arm  BP Method Automatic  Patient Position (if appropriate) Sitting  Oxygen Therapy  SpO2 99 %  O2 Device Room Air  Pain Assessment  Pain Scale 0-10  Pain Score 0  Dialysis Weight  Weight 49.7 kg  Type of Weight Pre-Dialysis  Education / Care Plan  Dialysis Education Provided Yes  Documented Education in Care Plan Yes  Fistula / Graft Left Upper arm Arteriovenous vein graft  Placement Date/Time: 12/26/18 1024   Placed prior to admission: No  Orientation: Left  Access Location: Upper arm  Access Type: (c) Arteriovenous vein graft  Site Condition No complications  Fistula / Graft Assessment Present;Thrill;Bruit (edema noted on left arm graft)  Drainage Description None  Hemodialysis Catheter Right Subclavian  No Placement Date or Time found.   Placed prior to admission: Yes  Orientation: Right  Access Location: Subclavian  Site Condition No complications  Blue Lumen Status Capped (Central line)  Red Lumen Status Capped (Central line)  Dressing Status Clean;Dry;Intact  Drainage Description None

## 2019-01-15 NOTE — Progress Notes (Signed)
[facility]  ROUNDING NOTE   Subjective:   Seen and examined on hemodialysis. Tolerating treatment well. UF goal of 1 liters.   Seated in chair Complains of left hand pain.     HEMODIALYSIS FLOWSHEET:  Blood Flow Rate (mL/min): 200 mL/min Arterial Pressure (mmHg): -80 mmHg Venous Pressure (mmHg): 50 mmHg Transmembrane Pressure (mmHg): 60 mmHg Ultrafiltration Rate (mL/min): 670 mL/min Dialysate Flow Rate (mL/min): 600 ml/min Conductivity: Machine : 14 Conductivity: Machine : 14 Dialysis Fluid Bolus: Normal Saline Bolus Amount (mL): 250 mL    Objective:  Vital signs in last 24 hours:  Temp:  [97.8 F (36.6 C)-98.5 F (36.9 C)] 98.5 F (36.9 C) (03/26 1025) Pulse Rate:  [84-87] 87 (03/26 1025) Resp:  [14-18] 14 (03/26 1025) BP: (135-150)/(78-82) 135/82 (03/26 1025) SpO2:  [99 %-100 %] 99 % (03/26 1025) Weight:  [49.7 kg] 49.7 kg (03/26 1025)  Weight change: -0.231 kg Filed Weights   01/13/19 1215 01/15/19 0621 01/15/19 1025  Weight: 47.5 kg 49.7 kg 49.7 kg    Intake/Output: No intake/output data recorded.   Intake/Output this shift:  No intake/output data recorded.  Physical Exam: General: No acute distress  Head: Normocephalic, atraumatic. Moist oral mucosal membranes  Eyes: Anicteric  Neck: Supple, trachea midline  Lungs:  Clear to auscultation, normal effort  Heart: regular  Abdomen:  Soft, nontender, bowel sounds present  Extremities: No peripheral edema.  Neurologic: Awake, alert, oriented  Skin: No lesions  Psych Depressed affect  Access: Left internal jugular PermCath, left arm AVG +thrill and +bruit    Basic Metabolic Panel: Recent Labs  Lab 01/08/19 1611 01/13/19 0932 01/14/19 0651  NA  --   --  136  K  --   --  4.5  CL  --   --  101  CO2  --   --  26  GLUCOSE  --   --  93  BUN  --   --  37*  CREATININE  --   --  5.56*  CALCIUM  --   --  8.6*  PHOS 6.5* 3.0 2.4*    Liver Function Tests: Recent Labs  Lab  01/14/19 0651  ALBUMIN 3.6   No results for input(s): LIPASE, AMYLASE in the last 168 hours. No results for input(s): AMMONIA in the last 168 hours.  CBC: Recent Labs  Lab 01/08/19 1611 01/12/19 0707 01/14/19 0651  WBC 7.6 7.0 7.0  HGB 9.0* 9.0* 8.3*  HCT 27.4* 27.8* 25.6*  MCV 88.1 89.7 90.1  PLT 186 195 199    Cardiac Enzymes: No results for input(s): CKTOTAL, CKMB, CKMBINDEX, TROPONINI in the last 168 hours.  BNP: Invalid input(s): POCBNP  CBG: No results for input(s): GLUCAP in the last 168 hours.  Microbiology: Results for orders placed or performed during the hospital encounter of 12/24/18  MRSA PCR Screening     Status: None   Collection Time: 12/27/18  1:20 PM  Result Value Ref Range Status   MRSA by PCR NEGATIVE NEGATIVE Final    Comment:        The GeneXpert MRSA Assay (FDA approved for NASAL specimens only), is one component of a comprehensive MRSA colonization surveillance program. It is not intended to diagnose MRSA infection nor to guide or monitor treatment for MRSA infections. Performed at [facility], [place] [address], [place], [place] [address]     Coagulation Studies: No results for input(s): LABPROT, INR in the last 72 hours.  Urinalysis: No results for input(s):  COLORURINE, LABSPEC, [place], GLUCOSEU, HGBUR, BILIRUBINUR, KETONESUR, PROTEINUR, UROBILINOGEN, NITRITE, LEUKOCYTESUR in the last 72 hours.  Invalid input(s): APPERANCEUR    Imaging: No results found.   Medications:   . sodium chloride    . sodium chloride     . amLODipine  5 mg Oral Daily  . apixaban  2.5 mg Oral BID  . aspirin EC  81 mg Oral Daily  . atorvastatin  80 mg Oral QHS  . carvedilol  25 mg Oral BID WC  . Chlorhexidine Gluconate Cloth  6 each Topical Q0600  . epoetin (EPOGEN/PROCRIT) injection  10,000 Units Intravenous Q T,Th,Sa-HD  . escitalopram  10 mg Oral Daily  . gabapentin  100 mg Oral TID  . hydrALAZINE  25 mg Oral Q8H  . multivitamin  1  tablet Oral Daily  . sevelamer carbonate  1,600 mg Oral TID WC   sodium chloride, sodium chloride, acetaminophen, alteplase, heparin, heparin, hydrOXYzine, lidocaine (PF), lidocaine-prilocaine, pentafluoroprop-tetrafluoroeth  Assessment/ Plan:  [name] is a 43 y.o. white male with end stage renal disease on hemodialysis, hypertension, depression, aortobifemoral bypass, who was admitted to [facility] on 01/07/2019 for evaluation and treatment of major depressive disorder.  [facility] TTS   1.  ESRD: seen and examined on hemodialysis treatment. Tolerating treatment well.   2.  Anemia chronic kidney disease:   - EPO with HD treatment  3.  Secondary hyperparathyroidism with hyperphosphatemia:  - Sevelamer with meals  4.  Hypertension.   Current regimen of amlodipine, carvedilol, hydralazine.    5. Dialysis access device: maturing left AVG placed on 3/6 by Dr. Carlis Abbott. Now with some numbness - Will have vascular assess.    LOS: 8 Cutter Passey 3/26/202010:50 AM

## 2019-01-15 NOTE — Progress Notes (Signed)
[facility] MD Progress Note  01/15/2019 3:48 PM Brett Small  MRN:  [id number] Subjective: Follow-up for this patient with a history of depression and multiple medical problems.  Patient seen chart reviewed.  Patient still feels fatigued much of the time as would be expected from dialysis.  He denies suicidal ideation.  Denies psychosis.  Participates in some social activities appropriately.  Patient says that he is possibly open to the idea of going to a group home if nothing else is available but would like to try to get in touch with his daughter and ex-wife first. Principal Problem: Depression Diagnosis: Principal Problem:   Depression Active Problems:   End stage renal disease ([place])   Hypertension   Homelessness  Total Time spent with patient: 30 minutes  Past Psychiatric History: Patient has no previous psychiatric hospitalizations.  Past Medical History:  Past Medical History:  Diagnosis Date  . Hypertension   . Renal disorder     Past Surgical History:  Procedure Laterality Date  . AORTA - FEMORAL ARTERY BYPASS GRAFT    . AV FISTULA PLACEMENT Left 12/26/2018   Procedure: INSERTION OF GORE STRETCH VASCULAR 4-7MM X  45CM IN LEFT UPPER ARM;  Surgeon: Marty Heck, MD;  Location: [facility] OR;  Service: Vascular;  Laterality: Left;   Family History:  Family History  Problem Relation Age of Onset  . Hypertension Other   . Diabetes Other   . Clotting disorder Father    Family Psychiatric  History: See previous Social History:  Social History   Substance and Sexual Activity  Alcohol Use Not Currently     Social History   Substance and Sexual Activity  Drug Use Never    Social History   Socioeconomic History  . Marital status: Single    Spouse name: Not on file  . Number of children: Not on file  . Years of education: Not on file  . Highest education level: Not on file  Occupational History  . Not on file  Social Needs  . Financial resource strain: Not on file  .  Food insecurity:    Worry: Not on file    Inability: Not on file  . Transportation needs:    Medical: Not on file    Non-medical: Not on file  Tobacco Use  . Smoking status: Former Smoker    Last attempt to quit: 06/26/2018    Years since quitting: 0.5  . Smokeless tobacco: Never Used  . Tobacco comment: smoked for 30 years   Substance and Sexual Activity  . Alcohol use: Not Currently  . Drug use: Never  . Sexual activity: Not on file  Lifestyle  . Physical activity:    Days per week: Not on file    Minutes per session: Not on file  . Stress: Not on file  Relationships  . Social connections:    Talks on phone: Not on file    Gets together: Not on file    Attends religious service: Not on file    Active member of club or organization: Not on file    Attends meetings of clubs or organizations: Not on file    Relationship status: Not on file  Other Topics Concern  . Not on file  Social History Narrative  . Not on file   Additional Social History:                         Sleep: Fair  Appetite:  Fair  Current Medications: Current Facility-Administered Medications  Medication Dose Route Frequency Provider Last Rate Last Dose  . 0.9 %  sodium chloride infusion  100 mL Intravenous PRN Lateef, Munsoor, MD      . 0.9 %  sodium chloride infusion  100 mL Intravenous PRN Lateef, Munsoor, MD      . acetaminophen (TYLENOL) tablet 650 mg  650 mg Oral Q6H PRN Tennis Ship, MD   650 mg at 01/14/19 0219  . alteplase (CATHFLO ACTIVASE) injection 2 mg  2 mg Intracatheter Once PRN Lateef, Munsoor, MD      . amLODipine (NORVASC) tablet 5 mg  5 mg Oral Daily Tennis Ship, MD   5 mg at 01/15/19 1357  . apixaban (ELIQUIS) tablet 2.5 mg  2.5 mg Oral BID Clapacs, Madie Reno, MD   2.5 mg at 01/15/19 1359  . aspirin EC tablet 81 mg  81 mg Oral Daily Tennis Ship, MD   81 mg at 01/15/19 1356  . atorvastatin (LIPITOR) tablet 80 mg  80 mg Oral QHS Tennis Ship, MD   80 mg at 01/14/19  2153  . carvedilol (COREG) tablet 25 mg  25 mg Oral BID WC Tennis Ship, MD   25 mg at 01/15/19 1357  . Chlorhexidine Gluconate Cloth 2 % PADS 6 each  6 each Topical Q0600 Holley Raring, Munsoor, MD   6 each at 01/15/19 0622  . epoetin alfa (EPOGEN,PROCRIT) injection 10,000 Units  10,000 Units Intravenous Q T,Th,Sa-HD Holley Raring, Munsoor, MD   10,000 Units at 01/15/19 1131  . escitalopram (LEXAPRO) tablet 10 mg  10 mg Oral Daily Tennis Ship, MD   10 mg at 01/15/19 1356  . gabapentin (NEURONTIN) capsule 100 mg  100 mg Oral TID Clapacs, John T, MD   100 mg at 01/15/19 1357  . heparin injection 1,000 Units  1,000 Units Dialysis PRN Lateef, Munsoor, MD      . heparin injection 2,000 Units  2,000 Units Dialysis PRN Roney Jaffe, MD      . hydrALAZINE (APRESOLINE) tablet 25 mg  25 mg Oral Q8H O'Neal, Sarita, MD   25 mg at 01/15/19 4193  . hydrOXYzine (ATARAX/VISTARIL) tablet 25 mg  25 mg Oral TID PRN Tennis Ship, MD   25 mg at 01/15/19 7902  . lidocaine (PF) (XYLOCAINE) 1 % injection 5 mL  5 mL Intradermal PRN Lateef, Munsoor, MD      . lidocaine-prilocaine (EMLA) cream 1 application  1 application Topical PRN Lateef, Munsoor, MD      . multivitamin (RENA-VIT) tablet 1 tablet  1 tablet Oral Daily Tennis Ship, MD   1 tablet at 01/15/19 1359  . pentafluoroprop-tetrafluoroeth (GEBAUERS) aerosol 1 application  1 application Topical PRN Lateef, Munsoor, MD      . sevelamer carbonate (RENVELA) tablet 1,600 mg  1,600 mg Oral TID WC Tennis Ship, MD   1,600 mg at 01/15/19 1356    Lab Results:  Results for orders placed or performed during the hospital encounter of 01/07/19 (from the past 48 hour(s))  Renal function panel     Status: Abnormal   Collection Time: 01/14/19  6:51 AM  Result Value Ref Range   Sodium 136 135 - 145 mmol/L   Potassium 4.5 3.5 - 5.1 mmol/L   Chloride 101 98 - 111 mmol/L   CO2 26 22 - 32 mmol/L   Glucose, Bld 93 70 - 99 mg/dL   BUN 37 (H) 6 - 20 mg/dL   Creatinine, Ser 5.56  (H) 0.61 -  1.24 mg/dL   Calcium 8.6 (L) 8.9 - 10.3 mg/dL   Phosphorus 2.4 (L) 2.5 - 4.6 mg/dL   Albumin 3.6 3.5 - 5.0 g/dL   GFR calc non Af Amer 11 (L) >60 mL/min   GFR calc Af Amer 13 (L) >60 mL/min   Anion gap 9 5 - 15    Comment: Performed at [facility], [address] [place], [place] [address]  CBC     Status: Abnormal   Collection Time: 01/14/19  6:51 AM  Result Value Ref Range   WBC 7.0 4.0 - 10.5 K/uL   RBC 2.84 (L) 4.22 - 5.81 MIL/uL   Hemoglobin 8.3 (L) 13.0 - 17.0 g/dL   HCT 25.6 (L) 39.0 - 52.0 %   MCV 90.1 80.0 - 100.0 fL   MCH 29.2 26.0 - 34.0 pg   MCHC 32.4 30.0 - 36.0 g/dL   RDW 14.6 11.5 - 15.5 %   Platelets 199 150 - 400 K/uL   nRBC 0.0 0.0 - 0.2 %    Comment: Performed at [facility], [address] [place], [place] [address]    Blood Alcohol level:  No results found for: [facility]  Metabolic Disorder Labs: Lab Results  Component Value Date   HGBA1C 5.5 01/08/2019   MPG 111.15 01/08/2019   No results found for: PROLACTIN Lab Results  Component Value Date   CHOL 101 01/08/2019   TRIG 156 (H) 01/08/2019   HDL 33 (L) 01/08/2019   CHOLHDL 3.1 01/08/2019   VLDL 31 01/08/2019   LDLCALC 37 01/08/2019    Physical Findings: AIMS:  , ,  ,  ,    CIWA:    COWS:     Musculoskeletal: Strength & Muscle Tone: within normal limits Gait & Station: normal Patient leans: N/A  Psychiatric Specialty Exam: Physical Exam  Nursing note and vitals reviewed. Constitutional: He appears well-developed and well-nourished.  HENT:  Head: Normocephalic and atraumatic.  Eyes: Pupils are equal, round, and reactive to light. Conjunctivae are normal.  Neck: Normal range of motion.  Cardiovascular: Regular rhythm and normal heart sounds.  Respiratory: Effort normal. No respiratory distress.  GI: Soft.  Musculoskeletal: Normal range of motion.  Neurological: He is alert.  Skin: Skin is warm and dry.  Psychiatric: He has a normal mood and affect. His  speech is normal and behavior is normal. Judgment and thought content normal. Cognition and memory are normal.    Review of Systems  Constitutional: Negative.   HENT: Negative.   Eyes: Negative.   Respiratory: Negative.   Cardiovascular: Negative.   Gastrointestinal: Negative.   Musculoskeletal: Negative.   Skin: Negative.   Neurological: Negative.   Psychiatric/Behavioral: Negative.     Blood pressure (!) 141/83, pulse 88, temperature 98.7 F (37.1 C), temperature source Oral, resp. rate 15, height 5\' 1"  (1.549 m), weight 48.7 kg, SpO2 98 %.Body mass index is 20.29 kg/m.  General Appearance: Casual  Eye Contact:  Fair  Speech:  Slow  Volume:  Decreased  Mood:  Dysphoric  Affect:  Constricted  Thought Process:  Coherent  Orientation:  Full (Time, Place, and Person)  Thought Content:  Logical  Suicidal Thoughts:  No  Homicidal Thoughts:  No  Memory:  Immediate;   Fair Recent;   Fair Remote;   Fair  Judgement:  Fair  Insight:  Fair  Psychomotor Activity:  Decreased  Concentration:  Concentration: Fair  Recall:  [employer] of Knowledge:  Fair  Language:  Fair  Akathisia:  No  Handed:  Right  AIMS (if indicated):     Assets:  Financial Resources/Insurance  ADL's:  Intact  Cognition:  Impaired,  Mild  Sleep:  Number of Hours: 6.45     Treatment Plan Summary: Daily contact with patient to assess and evaluate symptoms and progress in treatment, Medication management and Plan Patient is stable.  Not clearly having major depression not actively suicidal or dangerous but having no place to live.  Patient will be allowed if possible to get in touch with his family by the use of his cell phone if we can manage it.  We will need to work on some kind of group home disposition  Alethia Berthold, MD 01/15/2019, 3:48 PM

## 2019-01-15 NOTE — Progress Notes (Addendum)
Pre HD Assessment  Swelling noted on left upper arm AV graft, pt reports pain/numbness. Vascular to see patient for consult    01/15/19 1020  Neurological  Level of Consciousness Alert  Orientation Level Oriented X4  Respiratory  Respiratory Pattern Regular  Bilateral Breath Sounds Clear;Diminished  Cardiac  Pulse Regular  Vascular  R Radial Pulse +2  Integumentary  Integumentary (WDL) X  Skin Color Appropriate for ethnicity  Additional Integumentary Comments  (HD Pt - cath exit site and LUA graft)  Musculoskeletal  Musculoskeletal (WDL) WDL  GU Assessment  Genitourinary (WDL) X (HD Pt)  Psychosocial  Psychosocial (WDL) WDL  Emotional support given Given to patient

## 2019-01-15 NOTE — Progress Notes (Signed)
D:Patient isolates to room  During shift . Affect flat and sad . Limited interaction with staff Escorted to and from Dialysis without incidence. Technician indicated 1 liter of fluid was removed . Patient returned and got back into bed. Patient stated slept good last night .Stated appetite good  and energy level normal. Stated concentration good . Stated on Depression scale 0 , hopeless 0  and anxiety 5 .( low 0-10 high) Denies suicidal  homicidal ideations  .  No auditory hallucinations  . Appropriate ADL'S. Limited  Interacting with peers and staff.  A: Encourage patient participation with unit programming . Instruction  Given on  Medication , verbalize understanding. R: Voice no other concerns. Staff continue to monitor

## 2019-01-15 NOTE — Progress Notes (Signed)
Recreation Therapy Notes  Date: 01/15/2019  Time: 9:30 am   Location: Craft room   Behavioral response: N/A   Intervention Topic: Relaxation  Discussion/Intervention: Patient did not attend group.   Clinical Observations/Feedback:  Patient did not attend group.   Cherlyn Syring LRT/CTRS         Santanna Olenik 01/15/2019 10:25 AM

## 2019-01-15 NOTE — Consult Note (Signed)
[facility] Vascular Consult Note  MRN : [id number]  Brett Small is a 43 y.o. (10/24/1975) male who presents with chief complaint of left upper extremity pain and numbness.  History of Present Illness:  The patient is a 43 year old male with a past medical history of end-stage renal disease on dialysis, hypertension, homelessness, depression, aorto bi-femoral bypass and recent insertion of a brachial axillary graft by Dr. Carlis Abbott on December 26, 2018.  The patient endorses a history of left upper extremity pain and numbness which he states started immediately after his recent graft placement.  The patient states that it feels like "someone is smashing his hand with a hammer".  The patient is currently being maintained by a PermCath.  Seen and examined in dialysis.  Patient also notes swelling to left upper extremity.  He denies any breakdown of skin/wound formation.  Denies any fever, nausea or vomiting.  Patient is a admitted to behavioral health at this time.  Vascular surgery was consulted by Dr. Juleen China for possible steal syndrome. Current Facility-Administered Medications  Medication Dose Route Frequency Provider Last Rate Last Dose  . 0.9 %  sodium chloride infusion  100 mL Intravenous PRN Lateef, Munsoor, MD      . 0.9 %  sodium chloride infusion  100 mL Intravenous PRN Lateef, Munsoor, MD      . acetaminophen (TYLENOL) tablet 650 mg  650 mg Oral Q6H PRN Tennis Ship, MD   650 mg at 01/14/19 0219  . alteplase (CATHFLO ACTIVASE) injection 2 mg  2 mg Intracatheter Once PRN Lateef, Munsoor, MD      . amLODipine (NORVASC) tablet 5 mg  5 mg Oral Daily Tennis Ship, MD   5 mg at 01/14/19 0803  . apixaban (ELIQUIS) tablet 2.5 mg  2.5 mg Oral BID Clapacs, Madie Reno, MD   2.5 mg at 01/14/19 1729  . aspirin EC tablet 81 mg  81 mg Oral Daily Tennis Ship, MD   81 mg at 01/14/19 0803  . atorvastatin (LIPITOR) tablet 80 mg  80 mg Oral QHS Tennis Ship, MD   80 mg at  01/14/19 2153  . carvedilol (COREG) tablet 25 mg  25 mg Oral BID WC Tennis Ship, MD   25 mg at 01/14/19 1728  . Chlorhexidine Gluconate Cloth 2 % PADS 6 each  6 each Topical Q0600 Holley Raring, Munsoor, MD   6 each at 01/15/19 0622  . epoetin alfa (EPOGEN,PROCRIT) injection 10,000 Units  10,000 Units Intravenous Q T,Th,Sa-HD Holley Raring, Munsoor, MD   10,000 Units at 01/15/19 1131  . escitalopram (LEXAPRO) tablet 10 mg  10 mg Oral Daily Tennis Ship, MD   10 mg at 01/14/19 0803  . gabapentin (NEURONTIN) capsule 100 mg  100 mg Oral TID Clapacs, John T, MD   100 mg at 01/14/19 1727  . heparin injection 1,000 Units  1,000 Units Dialysis PRN Lateef, Munsoor, MD      . heparin injection 2,000 Units  2,000 Units Dialysis PRN Roney Jaffe, MD      . hydrALAZINE (APRESOLINE) tablet 25 mg  25 mg Oral Q8H O'Neal, Sarita, MD   25 mg at 01/15/19 4098  . hydrOXYzine (ATARAX/VISTARIL) tablet 25 mg  25 mg Oral TID PRN Tennis Ship, MD   25 mg at 01/15/19 1191  . lidocaine (PF) (XYLOCAINE) 1 % injection 5 mL  5 mL Intradermal PRN Lateef, Munsoor, MD      . lidocaine-prilocaine (EMLA) cream 1 application  1 application Topical PRN Lateef, Munsoor,  MD      . multivitamin (RENA-VIT) tablet 1 tablet  1 tablet Oral Daily Tennis Ship, MD   1 tablet at 01/14/19 0805  . pentafluoroprop-tetrafluoroeth (GEBAUERS) aerosol 1 application  1 application Topical PRN Lateef, Munsoor, MD      . sevelamer carbonate (RENVELA) tablet 1,600 mg  1,600 mg Oral TID WC Tennis Ship, MD   1,600 mg at 01/14/19 1728   Past Medical History:  Diagnosis Date  . Hypertension   . Renal disorder    Past Surgical History:  Procedure Laterality Date  . AORTA - FEMORAL ARTERY BYPASS GRAFT    . AV FISTULA PLACEMENT Left 12/26/2018   Procedure: INSERTION OF GORE STRETCH VASCULAR 4-7MM X  45CM IN LEFT UPPER ARM;  Surgeon: Marty Heck, MD;  Location: [facility];  Service: Vascular;  Laterality: Left;   Social History Social History    Tobacco Use  . Smoking status: Former Smoker    Last attempt to quit: 06/26/2018    Years since quitting: 0.5  . Smokeless tobacco: Never Used  . Tobacco comment: smoked for 30 years   Substance Use Topics  . Alcohol use: Not Currently  . Drug use: Never   Family History Family History  Problem Relation Age of Onset  . Hypertension Other   . Diabetes Other   . Clotting disorder Father   Denies a family history of peripheral artery disease, venous disease or renal disease.  Allergies  Allergen Reactions  . Codeine Nausea Only    Patient questioned this (??)  . Sulfa Antibiotics Hives and Nausea And Vomiting   REVIEW OF SYSTEMS (Negative unless checked)  Constitutional: [] Weight loss  [] Fever  [] Chills Cardiac: [] Chest pain   [] Chest pressure   [] Palpitations   [] Shortness of breath when laying flat   [] Shortness of breath at rest   [] Shortness of breath with exertion. Vascular:  [] Pain in legs with walking   [] Pain in legs at rest   [] Pain in legs when laying flat   [] Claudication   [] Pain in feet when walking  [] Pain in feet at rest  [] Pain in feet when laying flat   [] History of DVT   [] Phlebitis   [x] Swelling in legs   [] Varicose veins   [] Non-healing ulcers Pulmonary:   [] Uses home oxygen   [] Productive cough   [] Hemoptysis   [] Wheeze  [] COPD   [] Asthma Neurologic:  [] Dizziness  [] Blackouts   [] Seizures   [] History of stroke   [] History of TIA  [] Aphasia   [] Temporary blindness   [] Dysphagia   [] Weakness or numbness in arms   [] Weakness or numbness in legs Musculoskeletal:  [] Arthritis   [] Joint swelling   [] Joint pain   [] Low back pain Hematologic:  [] Easy bruising  [] Easy bleeding   [] Hypercoagulable state   [] Anemic  [] Hepatitis Gastrointestinal:  [] Blood in stool   [] Vomiting blood  [] Gastroesophageal reflux/heartburn   [] Difficulty swallowing. Genitourinary:  [x] Chronic kidney disease   [] Difficult urination  [] Frequent urination  [] Burning with urination   [] Blood in  urine Skin:  [] Rashes   [] Ulcers   [] Wounds Psychological:  [x] History of anxiety   [x]  History of major depression.  Physical Examination  Vitals:   01/15/19 1025 01/15/19 1026 01/15/19 1045 01/15/19 1100  BP: 135/82 125/72 128/75 126/74  Pulse: 87 85 85 89  Resp: 14 15 15 16   Temp: 98.5 F (36.9 C)     TempSrc: Oral     SpO2: 99% 99% 97% 98%  Weight: 49.7 kg  Height:       Body mass index is 20.69 kg/m. Gen:  WD/WN, NAD Head: [place]/AT, No temporalis wasting. Prominent temp pulse not noted. Ear/Nose/Throat: Hearing grossly intact, nares w/o erythema or drainage, oropharynx w/o Erythema/Exudate Eyes: Sclera non-icteric, conjunctiva clear Neck: Trachea midline.  No JVD.  Pulmonary:  Good air movement, respirations not labored, equal bilaterally.  Cardiac: RRR, normal S1, S2. Vascular:  Vessel Right Left  Radial Palpable Palpable  Ulnar Palpable Palpable  Brachial Palpable Palpable  Carotid Palpable, without bruit Palpable, without bruit  Aorta Not palpable N/A  Femoral Palpable Palpable  Popliteal Palpable Palpable  PT Palpable Palpable  DP Palpable Palpable   Left Upper Extremity: Upper/lower arm is moderately edematous.  There is no cellulitis.  Skin is intact.  Thrill is felt to the left upper extremity graft.  Incisions are healing well.  Hard to palpate radial pulse however the hand is warm.  Gastrointestinal: soft, non-tender/non-distended. No guarding/reflex.  Musculoskeletal: M/S 5/5 throughout.  Extremities without ischemic changes.  Neurologic: Sensation grossly intact in extremities.  Symmetrical.  Speech is fluent. Motor exam as listed above. Psychiatric: Judgment intact, Mood & affect appropriate for pt's clinical situation. Dermatologic: No rashes or ulcers noted.  No cellulitis or open wounds. Lymph : No Cervical, Axillary, or Inguinal lymphadenopathy.  CBC Lab Results  Component Value Date   WBC 7.0 01/14/2019   HGB 8.3 (L) 01/14/2019   HCT 25.6 (L)  01/14/2019   MCV 90.1 01/14/2019   PLT 199 01/14/2019   BMET    Component Value Date/Time   NA 136 01/14/2019 0651   K 4.5 01/14/2019 0651   CL 101 01/14/2019 0651   CO2 26 01/14/2019 0651   GLUCOSE 93 01/14/2019 0651   BUN 37 (H) 01/14/2019 0651   CREATININE 5.56 (H) 01/14/2019 0651   CALCIUM 8.6 (L) 01/14/2019 0651   GFRNONAA 11 (L) 01/14/2019 0651   GFRAA 13 (L) 01/14/2019 0626   Estimated Creatinine Clearance: 11.8 mL/min (A) (by C-G formula based on SCr of 5.56 mg/dL (H)).  COAG Lab Results  Component Value Date   INR 1.1 12/26/2018   Radiology Dg Chest Portable 1 View  Result Date: 12/24/2018 CLINICAL DATA:  Shortness of breath EXAM: PORTABLE CHEST 1 VIEW COMPARISON:  None. FINDINGS: Right-sided central venous catheter tip over the SVC. No pleural effusion. Moderate-to-marked bilateral interstitial, ground glass opacity and consolidations right greater than left. Normal heart size. No pneumothorax. IMPRESSION: Moderate-to-marked bilateral right greater than left interstitial and ground-glass opacity, which may reflect pulmonary edema or diffuse infection. Electronically Signed   By: Donavan Foil M.D.   On: 12/24/2018 23:47   Vas [place] Upper Ext Vein Mapping (pre-op Avf)  Result Date: 12/25/2018 UPPER EXTREMITY VEIN MAPPING  Indications: Pre-access. Performing Technologist: Abram Sander RVS  Examination Guidelines: A complete evaluation includes B-mode imaging, spectral Doppler, color Doppler, and power Doppler as needed of all accessible portions of each vessel. Bilateral testing is considered an integral part of a complete examination. Limited examinations for reoccurring indications may be performed as noted. +-----------------+-------------+----------+----------------------------------+ Right Cephalic   Diameter (cm)Depth (cm)             Findings              +-----------------+-------------+----------+----------------------------------+ Shoulder             0.30         0.36                                      +-----------------+-------------+----------+----------------------------------+  Prox upper arm       0.22        0.28                                      +-----------------+-------------+----------+----------------------------------+ Mid upper arm        0.22        0.27                                      +-----------------+-------------+----------+----------------------------------+ Dist upper arm                          not visualized due to iv placement +-----------------+-------------+----------+----------------------------------+ Antecubital fossa    0.25        0.23                                      +-----------------+-------------+----------+----------------------------------+ Prox forearm         0.25        0.39               branching              +-----------------+-------------+----------+----------------------------------+ Mid forearm          0.26        0.18                                      +-----------------+-------------+----------+----------------------------------+ Dist forearm         0.22        0.19                                      +-----------------+-------------+----------+----------------------------------+ Wrist                0.23        0.19                                      +-----------------+-------------+----------+----------------------------------+ +-----------------+-------------+----------+----------------------------------+ Right Basilic    Diameter (cm)Depth (cm)             Findings              +-----------------+-------------+----------+----------------------------------+ Prox upper arm       0.24        0.93                                      +-----------------+-------------+----------+----------------------------------+ Mid upper arm        0.31        0.76                                       +-----------------+-------------+----------+----------------------------------+ Dist upper arm       0.19        0.27  thrombosed             +-----------------+-------------+----------+----------------------------------+ Antecubital fossa                       not visualized due to IV placement +-----------------+-------------+----------+----------------------------------+ Prox forearm                                      not visualized           +-----------------+-------------+----------+----------------------------------+ Mid forearm                                       not visualized           +-----------------+-------------+----------+----------------------------------+ Distal forearm                                    not visualized           +-----------------+-------------+----------+----------------------------------+ Elbow                                             not visualized           +-----------------+-------------+----------+----------------------------------+ Wrist                                             not visualized           +-----------------+-------------+----------+----------------------------------+ +-----------------+-------------+----------+--------------+ Left Cephalic    Diameter (cm)Depth (cm)   Findings    +-----------------+-------------+----------+--------------+ Shoulder             0.41        0.28                  +-----------------+-------------+----------+--------------+ Prox upper arm       0.42        0.29                  +-----------------+-------------+----------+--------------+ Mid upper arm        0.21        0.24                  +-----------------+-------------+----------+--------------+ Dist upper arm       0.24        0.23                  +-----------------+-------------+----------+--------------+ Antecubital fossa    0.32        0.17                   +-----------------+-------------+----------+--------------+ Prox forearm         0.11        0.35     branching    +-----------------+-------------+----------+--------------+ Mid forearm                             not visualized +-----------------+-------------+----------+--------------+ Dist forearm  not visualized +-----------------+-------------+----------+--------------+ Wrist                                   not visualized +-----------------+-------------+----------+--------------+ +-----------------+-------------+----------+----------------------------+ Left Basilic     Diameter (cm)Depth (cm)          Findings           +-----------------+-------------+----------+----------------------------+ Prox upper arm       0.25        0.53                                +-----------------+-------------+----------+----------------------------+ Mid upper arm        0.21        0.46                                +-----------------+-------------+----------+----------------------------+ Dist upper arm       0.15        0.35                                +-----------------+-------------+----------+----------------------------+ Antecubital fossa    0.25        0.45            branching           +-----------------+-------------+----------+----------------------------+ Prox forearm                            branching and not visualized +-----------------+-------------+----------+----------------------------+ Mid forearm                                    not visualized        +-----------------+-------------+----------+----------------------------+ Distal forearm                                 not visualized        +-----------------+-------------+----------+----------------------------+ Elbow                                          not visualized        +-----------------+-------------+----------+----------------------------+  Wrist                                          not visualized        +-----------------+-------------+----------+----------------------------+ *See table(s) above for measurements and observations.  Diagnosing physician: Servando Snare MD Electronically signed by Servando Snare MD on 12/25/2018 at 5:51:56 PM.    Final    Assessment/Plan The patient is a 43 year old male with a past medical history of end-stage renal disease on dialysis, hypertension, homelessness, depression, aorto bi-femoral bypass and recent insertion of a brachial axillary graft by Dr. Carlis Abbott on December 26, 2018.  Patient with ongoing left hand pain and numbness. 1. ESRD on HD: Patient endorses a history of left hand pain and numbness which started immediately after surgery.  This pain has progressively worsened.  Physical exam is notable for  a moderately edematous left upper extremity, thrill noted in his left brachial axillary graft, hard to palpate radial / ulnar pulse, warm left hand with motor/sensory intact.  With the exception of his moderate edema the patient's physical exam is relatively unremarkable however he is experiencing lifestyle limiting significant numbness and pain to the left hand.  An attempt to assess the patient's anatomy and rule out any steal syndrome that may be present we will plan for a left upper extremity fistulogram/left upper extremity angiogram on Monday with Dr. Lucky Cowboy.  Procedure, risks and benefits explained to the patient.  All questions answered.  The patient wishes to proceed. 2. Hypertension: On appropriate medications. Encouraged good control as its slows the progression of atherosclerotic disease 3. Depression: The patient is currently admitted to behavioral health and is receiving care through their service  [place], PA-C  01/15/2019 11:54 AM  This note was created with Dragon medical transcription system.  Any error is purely unintentional.

## 2019-01-15 NOTE — Progress Notes (Signed)
HD Tx Start   01/15/19 1026  Vital Signs  Pulse Rate 85  Resp 15  BP 125/72  Oxygen Therapy  SpO2 99 %  During Hemodialysis Assessment  Blood Flow Rate (mL/min) 400 mL/min  Arterial Pressure (mmHg) -160 mmHg  Venous Pressure (mmHg) 110 mmHg  Transmembrane Pressure (mmHg) 60 mmHg  Ultrafiltration Rate (mL/min) 500 mL/min (586mL per HOUR)  Dialysate Flow Rate (mL/min) 600 ml/min  Conductivity: Machine  14 (14)  HD Safety Checks Performed Yes  Dialysis Fluid Bolus Normal Saline  Bolus Amount (mL) 250 mL  Intra-Hemodialysis Comments Tx initiated  Hemodialysis Catheter Right Subclavian  No Placement Date or Time found.   Placed prior to admission: Yes  Orientation: Right  Access Location: Subclavian  Site Condition No complications  Blue Lumen Status Infusing  Red Lumen Status Infusing  Dressing Status Clean;Dry;Intact  Drainage Description None

## 2019-01-15 NOTE — Progress Notes (Signed)
D: Patient has been calm and cooperative. Denies SI, HI and AV hallucinations. A: Continue to monitor and offer support. R: Safety maintained.

## 2019-01-15 NOTE — Plan of Care (Signed)
Patient is knowledgeable of information received , able to verbalize  Mental status and emotions  slightly improved. Limited Interacting  with staff  compliant with medication  verbalize understanding of  medication received . Voice no concerns around  sleep. No safety concerns . Thought process  remained disorganized   Limited Working  on coping skills and decision making no group participation today   Problem: Education: Goal: Knowledge of [facility] [facility] Education information/materials will improve Outcome: Progressing Goal: Emotional status will improve Outcome: Progressing Goal: Mental status will improve Outcome: Progressing Goal: Verbalization of understanding the information provided will improve Outcome: Progressing   Problem: Safety: Goal: Periods of time without injury will increase Outcome: Progressing   Problem: Education: Goal: Utilization of techniques to improve thought processes will improve Outcome: Progressing Goal: Knowledge of the prescribed therapeutic regimen will improve Outcome: Progressing   Problem: Coping: Goal: Coping ability will improve Outcome: Progressing Goal: Will verbalize feelings Outcome: Progressing

## 2019-01-15 NOTE — Plan of Care (Signed)
D: Patient has been calm and cooperative. Denies SI, HI and AV hallucinations. A: Continue to monitor and offer support. R: Safety maintained.

## 2019-01-16 ENCOUNTER — Other Ambulatory Visit (INDEPENDENT_AMBULATORY_CARE_PROVIDER_SITE_OTHER): Payer: Self-pay | Admitting: Vascular Surgery

## 2019-01-16 LAB — CBC
HCT: 25.5 % — ABNORMAL LOW (ref 39.0–52.0)
Hemoglobin: 8.2 g/dL — ABNORMAL LOW (ref 13.0–17.0)
MCH: 29.7 pg (ref 26.0–34.0)
MCHC: 32.2 g/dL (ref 30.0–36.0)
MCV: 92.4 fL (ref 80.0–100.0)
Platelets: 190 10*3/uL (ref 150–400)
RBC: 2.76 MIL/uL — ABNORMAL LOW (ref 4.22–5.81)
RDW: 16.2 % — ABNORMAL HIGH (ref 11.5–15.5)
WBC: 5.8 10*3/uL (ref 4.0–10.5)
nRBC: 0 % (ref 0.0–0.2)

## 2019-01-16 LAB — RENAL FUNCTION PANEL
ANION GAP: 11 (ref 5–15)
Albumin: 3.5 g/dL (ref 3.5–5.0)
BUN: 34 mg/dL — ABNORMAL HIGH (ref 6–20)
CO2: 25 mmol/L (ref 22–32)
Calcium: 8.4 mg/dL — ABNORMAL LOW (ref 8.9–10.3)
Chloride: 100 mmol/L (ref 98–111)
Creatinine, Ser: 5.13 mg/dL — ABNORMAL HIGH (ref 0.61–1.24)
GFR calc Af Amer: 15 mL/min — ABNORMAL LOW (ref >60)
GFR calc non Af Amer: 13 mL/min — ABNORMAL LOW (ref >60)
Glucose, Bld: 113 mg/dL — ABNORMAL HIGH (ref 70–99)
Phosphorus: 2.7 mg/dL (ref 2.5–4.6)
Potassium: 4.6 mmol/L (ref 3.5–5.1)
SODIUM: 136 mmol/L (ref 135–145)

## 2019-01-16 MED ORDER — BACITRACIN-NEOMYCIN-POLYMYXIN OINTMENT TUBE
TOPICAL_OINTMENT | Freq: Two times a day (BID) | CUTANEOUS | Status: DC
  Administered 2019-01-16 – 2019-01-23 (×11): via TOPICAL
  Administered 2019-01-24 (×2): 1 via TOPICAL
  Administered 2019-01-25: 08:00:00 via TOPICAL
  Administered 2019-01-26 (×2): 1 via TOPICAL
  Filled 2019-01-16: qty 14.17

## 2019-01-16 MED ORDER — CEFAZOLIN SODIUM-DEXTROSE 1-4 GM/50ML-% IV SOLN
1.0000 g | Freq: Once | INTRAVENOUS | Status: AC
  Administered 2019-01-19: 1 g via INTRAVENOUS
  Filled 2019-01-16: qty 50

## 2019-01-16 NOTE — Consult Note (Signed)
[facility] Nurse wound consult note Patient receiving care in [facility].  Patient assessed with the assistance of his primary RN. Reason for Consult: Right ankle wound, weeping Per the patient he broke his right ankle last year and at one time had hardware in the ankle, but it was removed when it got infected.  Today there is a pin-hole size opening at the juncture of the ankle and foot that is draining clear liquid.  The patient states that he sometimes scratches it because it itches.  There is no erythema, induration, warmth or other s/s of infection at this time.  It had an ordinary adhesive bandage on it.  The area was wiped clean and a new adhesive bandage applied. Plan of care: Ensure the area is being cleansed with soap and water daily with application of an adhesive bandage. Monitor the wound area(s) for worsening of condition such as: Signs/symptoms of infection,  Increase in size,  Development of or worsening of odor, Development of pain, or increased pain at the affected locations.  Notify the medical team if any of these develop.  Thank you for the consult.  Discussed plan of care with the patient and bedside nurse.  [facility] nurse will not follow at this time.  Please re-consult the [facility] team if needed.  Val Riles, RN, MSN, CWOCN, CNS-BC, pager [phone]

## 2019-01-16 NOTE — Progress Notes (Signed)
Prn atarax given twice this shift for complaints of anxiety at 2150pm and 0614am.  Pt stated that he is worried and anxious because of his homelessness and those thoughts prevent him from resting well at night. Will continue to monitor.

## 2019-01-16 NOTE — BHH Counselor (Signed)
CSW helped facilitate conversation between Brett Small, [phone], and patient in regards to her group home.  Hassan Rowan reviewed rules, expectations and financial obligations while at the group home.   Brett Small appeared open to group home.  Patient requested time to think about the group home before allowing CSW to send requested information to group home.    Patient requested an interview with the second group home option, and CSW reports that she will follow up after Brett Small eats his lunch.    [name], MSW, LCSW 01/16/2019 11:30 AM

## 2019-01-16 NOTE — Plan of Care (Signed)
Pt socializing in the milieu with peers. Mood is pleasant and thought process logical and coherent. Pt states he is doing better, however, his left arm was bothering him. Pain assessment completed with prn pain medication given for left arm pain. Medication effective. See chart for details. Left arm fistula intact with no complications noted. Thrill and bruit present. Pt remains med compliant. Q 15 minute safety checks maintained.  Problem: Education: Goal: Knowledge of [facility] Education information/materials will improve Outcome: Progressing Goal: Emotional status will improve Outcome: Progressing Goal: Mental status will improve Outcome: Progressing Goal: Verbalization of understanding the information provided will improve Outcome: Progressing   Problem: Safety: Goal: Periods of time without injury will increase Outcome: Progressing   Problem: Education: Goal: Utilization of techniques to improve thought processes will improve Outcome: Progressing Goal: Knowledge of the prescribed therapeutic regimen will improve Outcome: Progressing   Problem: Coping: Goal: Coping ability will improve Outcome: Progressing Goal: Will verbalize feelings Outcome: Progressing   Problem: Acute Pain Goal: Pain control Description Patient will demonstrate personal actions to control pain.   Outcome: Progressing

## 2019-01-16 NOTE — NC FL2 (Signed)
[place] LEVEL OF CARE SCREENING TOOL     IDENTIFICATION  Patient Name: Brett Small Birthdate: 30-Aug-1976 Sex: male Admission Date (Current Location): 01/07/2019  [place] and [place] Number:  Selena Lesser [id number] [place] and Address:  [facility], [address], [place], [place] [address]      Provider Number: [id number]  Attending Physician Name and Address:  Gonzella Lex, MD  Relative Name and Phone Number:       Current Level of Care: Hospital Recommended Level of Care: Other (Comment)(Group Home) Prior Approval Number:    Date Approved/Denied:   PASRR Number:    Discharge Plan: Other (Comment)(Group Home)    Current Diagnoses: Patient Active Problem List   Diagnosis Date Noted  . Depression 01/07/2019  . MDD (major depressive disorder), single episode, severe , no psychosis ([place])   . Homelessness   . Acute respiratory failure with hypoxia ([place]) 12/25/2018  . End stage renal disease ([place]) 12/25/2018  . Hypertension 12/25/2018  . Renal osteodystrophy 12/25/2018    Orientation RESPIRATION BLADDER Height & Weight     Self, Time, Situation, Place  Normal Continent Weight: 107 lb 5.8 oz (48.7 kg) Height:  5\' 1"  (154.9 cm)  BEHAVIORAL SYMPTOMS/MOOD NEUROLOGICAL BOWEL NUTRITION STATUS      Continent Diet(Normal)  AMBULATORY STATUS COMMUNICATION OF NEEDS Skin   Independent Verbally Other (Comment)(wound on ankle)                       Personal Care Assistance Level of Assistance              Functional Limitations Info             SPECIAL CARE FACTORS FREQUENCY  (Dialysis)                    Contractures Contractures Info: Not present    Additional Factors Info  Allergies, Code Status Code Status Info: Partial Allergies Info: Dodeine, Sulfa Antibiotics           Current Medications (01/16/2019):  This is the current hospital active medication list Current Facility-Administered  Medications  Medication Dose Route Frequency Provider Last Rate Last Dose  . 0.9 %  sodium chloride infusion  100 mL Intravenous PRN Lateef, Munsoor, MD      . 0.9 %  sodium chloride infusion  100 mL Intravenous PRN Lateef, Munsoor, MD      . acetaminophen (TYLENOL) tablet 650 mg  650 mg Oral Q6H PRN Tennis Ship, MD   650 mg at 01/14/19 0219  . alteplase (CATHFLO ACTIVASE) injection 2 mg  2 mg Intracatheter Once PRN Lateef, Munsoor, MD      . amLODipine (NORVASC) tablet 5 mg  5 mg Oral Daily Tennis Ship, MD   5 mg at 01/16/19 0805  . apixaban (ELIQUIS) tablet 2.5 mg  2.5 mg Oral BID Clapacs, Madie Reno, MD   2.5 mg at 01/16/19 0808  . aspirin EC tablet 81 mg  81 mg Oral Daily Tennis Ship, MD   81 mg at 01/16/19 0805  . atorvastatin (LIPITOR) tablet 80 mg  80 mg Oral QHS Tennis Ship, MD   80 mg at 01/15/19 2146  . carvedilol (COREG) tablet 25 mg  25 mg Oral BID WC Tennis Ship, MD   25 mg at 01/16/19 0806  . [START ON 01/19/2019] ceFAZolin (ANCEF) IVPB 1 g/50 mL premix  1 g Intravenous Once Stegmayer, Kimberly A, PA-C      .  Chlorhexidine Gluconate Cloth 2 % PADS 6 each  6 each Topical Q0600 Holley Raring, Munsoor, MD   6 each at 01/15/19 0622  . epoetin alfa (EPOGEN,PROCRIT) injection 10,000 Units  10,000 Units Intravenous Q T,Th,Sa-HD Holley Raring, Munsoor, MD   10,000 Units at 01/15/19 1131  . escitalopram (LEXAPRO) tablet 10 mg  10 mg Oral Daily Tennis Ship, MD   10 mg at 01/16/19 0804  . gabapentin (NEURONTIN) capsule 100 mg  100 mg Oral TID Clapacs, John T, MD   100 mg at 01/16/19 0805  . heparin injection 1,000 Units  1,000 Units Dialysis PRN Lateef, Munsoor, MD      . heparin injection 2,000 Units  2,000 Units Dialysis PRN Roney Jaffe, MD      . hydrALAZINE (APRESOLINE) tablet 25 mg  25 mg Oral Q8H O'Neal, Fredirick Maudlin, MD   25 mg at 01/16/19 7341  . hydrOXYzine (ATARAX/VISTARIL) tablet 25 mg  25 mg Oral TID PRN Tennis Ship, MD   25 mg at 01/16/19 9379  . ibuprofen (ADVIL,MOTRIN) tablet  400 mg  400 mg Oral Q6H PRN Clapacs, Madie Reno, MD   400 mg at 01/15/19 2146  . lidocaine (PF) (XYLOCAINE) 1 % injection 5 mL  5 mL Intradermal PRN Lateef, Munsoor, MD      . lidocaine-prilocaine (EMLA) cream 1 application  1 application Topical PRN Lateef, Munsoor, MD      . multivitamin (RENA-VIT) tablet 1 tablet  1 tablet Oral Daily Tennis Ship, MD   1 tablet at 01/16/19 0807  . pentafluoroprop-tetrafluoroeth (GEBAUERS) aerosol 1 application  1 application Topical PRN Lateef, Munsoor, MD      . sevelamer carbonate (RENVELA) tablet 1,600 mg  1,600 mg Oral TID WC Tennis Ship, MD   1,600 mg at 01/16/19 0806     Discharge Medications: Please see discharge summary for a list of discharge medications.  Relevant Imaging Results:  Relevant Lab Results:   Additional Information    Rozann Lesches, LCSW

## 2019-01-16 NOTE — Progress Notes (Signed)
[facility] MD Progress Note  01/16/2019 12:53 PM Brett Small  MRN:  [id number] Subjective: Follow-up for this gentleman with end-stage renal failure on dialysis dysthymia and homelessness.  Patient seen chart reviewed case discussed with treatment team.  Patient is denying any suicidal ideation.  Affect is blunted but reactive.  He is taking care of his ADLs and interacting fine with peers and staff.  1 new complaint is that the wound on his right ankle is weeping some fluid today and it looks a little more reddened although it is not painful.  Patient continues to be here primarily because of lack of safe placement Principal Problem: Depression Diagnosis: Principal Problem:   Depression Active Problems:   End stage renal disease ([place])   Hypertension   Homelessness  Total Time spent with patient: 30 minutes  Past Psychiatric History: Patient has no significant past psychiatric history  Past Medical History:  Past Medical History:  Diagnosis Date  . Hypertension   . Renal disorder     Past Surgical History:  Procedure Laterality Date  . AORTA - FEMORAL ARTERY BYPASS GRAFT    . AV FISTULA PLACEMENT Left 12/26/2018   Procedure: INSERTION OF GORE STRETCH VASCULAR 4-7MM X  45CM IN LEFT UPPER ARM;  Surgeon: Marty Heck, MD;  Location: [facility] OR;  Service: Vascular;  Laterality: Left;   Family History:  Family History  Problem Relation Age of Onset  . Hypertension Other   . Diabetes Other   . Clotting disorder Father    Family Psychiatric  History: The previous Social History:  Social History   Substance and Sexual Activity  Alcohol Use Not Currently     Social History   Substance and Sexual Activity  Drug Use Never    Social History   Socioeconomic History  . Marital status: Single    Spouse name: Not on file  . Number of children: Not on file  . Years of education: Not on file  . Highest education level: Not on file  Occupational History  . Not on file  Social Needs   . Financial resource strain: Not on file  . Food insecurity:    Worry: Not on file    Inability: Not on file  . Transportation needs:    Medical: Not on file    Non-medical: Not on file  Tobacco Use  . Smoking status: Former Smoker    Last attempt to quit: 06/26/2018    Years since quitting: 0.5  . Smokeless tobacco: Never Used  . Tobacco comment: smoked for 30 years   Substance and Sexual Activity  . Alcohol use: Not Currently  . Drug use: Never  . Sexual activity: Not on file  Lifestyle  . Physical activity:    Days per week: Not on file    Minutes per session: Not on file  . Stress: Not on file  Relationships  . Social connections:    Talks on phone: Not on file    Gets together: Not on file    Attends religious service: Not on file    Active member of club or organization: Not on file    Attends meetings of clubs or organizations: Not on file    Relationship status: Not on file  Other Topics Concern  . Not on file  Social History Narrative  . Not on file   Additional Social History:  Sleep: Fair  Appetite:  Fair  Current Medications: Current Facility-Administered Medications  Medication Dose Route Frequency Provider Last Rate Last Dose  . 0.9 %  sodium chloride infusion  100 mL Intravenous PRN Lateef, Munsoor, MD      . 0.9 %  sodium chloride infusion  100 mL Intravenous PRN Lateef, Munsoor, MD      . acetaminophen (TYLENOL) tablet 650 mg  650 mg Oral Q6H PRN Tennis Ship, MD   650 mg at 01/14/19 0219  . alteplase (CATHFLO ACTIVASE) injection 2 mg  2 mg Intracatheter Once PRN Lateef, Munsoor, MD      . amLODipine (NORVASC) tablet 5 mg  5 mg Oral Daily Tennis Ship, MD   5 mg at 01/16/19 0805  . apixaban (ELIQUIS) tablet 2.5 mg  2.5 mg Oral BID Clapacs, Madie Reno, MD   2.5 mg at 01/16/19 0808  . aspirin EC tablet 81 mg  81 mg Oral Daily Tennis Ship, MD   81 mg at 01/16/19 0805  . atorvastatin (LIPITOR) tablet 80 mg  80 mg  Oral QHS Tennis Ship, MD   80 mg at 01/15/19 2146  . carvedilol (COREG) tablet 25 mg  25 mg Oral BID WC Tennis Ship, MD   25 mg at 01/16/19 0806  . [START ON 01/19/2019] ceFAZolin (ANCEF) IVPB 1 g/50 mL premix  1 g Intravenous Once Stegmayer, Kimberly A, PA-C      . Chlorhexidine Gluconate Cloth 2 % PADS 6 each  6 each Topical Q0600 Holley Raring, Munsoor, MD   6 each at 01/15/19 0622  . epoetin alfa (EPOGEN,PROCRIT) injection 10,000 Units  10,000 Units Intravenous Q T,Th,Sa-HD Holley Raring, Munsoor, MD   10,000 Units at 01/15/19 1131  . escitalopram (LEXAPRO) tablet 10 mg  10 mg Oral Daily Tennis Ship, MD   10 mg at 01/16/19 0804  . gabapentin (NEURONTIN) capsule 100 mg  100 mg Oral TID Clapacs, John T, MD   100 mg at 01/16/19 1216  . heparin injection 1,000 Units  1,000 Units Dialysis PRN Lateef, Munsoor, MD      . heparin injection 2,000 Units  2,000 Units Dialysis PRN Roney Jaffe, MD      . hydrALAZINE (APRESOLINE) tablet 25 mg  25 mg Oral Q8H O'Neal, Sarita, MD   25 mg at 01/16/19 7416  . hydrOXYzine (ATARAX/VISTARIL) tablet 25 mg  25 mg Oral TID PRN Tennis Ship, MD   25 mg at 01/16/19 3845  . ibuprofen (ADVIL,MOTRIN) tablet 400 mg  400 mg Oral Q6H PRN Clapacs, Madie Reno, MD   400 mg at 01/15/19 2146  . lidocaine (PF) (XYLOCAINE) 1 % injection 5 mL  5 mL Intradermal PRN Lateef, Munsoor, MD      . lidocaine-prilocaine (EMLA) cream 1 application  1 application Topical PRN Lateef, Munsoor, MD      . multivitamin (RENA-VIT) tablet 1 tablet  1 tablet Oral Daily Tennis Ship, MD   1 tablet at 01/16/19 0807  . neomycin-bacitracin-polymyxin (NEOSPORIN) ointment   Topical BID Clapacs, John T, MD      . pentafluoroprop-tetrafluoroeth (GEBAUERS) aerosol 1 application  1 application Topical PRN Lateef, Munsoor, MD      . sevelamer carbonate (RENVELA) tablet 1,600 mg  1,600 mg Oral TID WC Tennis Ship, MD   1,600 mg at 01/16/19 1216    Lab Results:  Results for orders placed or performed during the  hospital encounter of 01/07/19 (from the past 48 hour(s))  Renal function panel     Status: Abnormal  Collection Time: 01/16/19  7:01 AM  Result Value Ref Range   Sodium 136 135 - 145 mmol/L   Potassium 4.6 3.5 - 5.1 mmol/L   Chloride 100 98 - 111 mmol/L   CO2 25 22 - 32 mmol/L   Glucose, Bld 113 (H) 70 - 99 mg/dL   BUN 34 (H) 6 - 20 mg/dL   Creatinine, Ser 5.13 (H) 0.61 - 1.24 mg/dL   Calcium 8.4 (L) 8.9 - 10.3 mg/dL   Phosphorus 2.7 2.5 - 4.6 mg/dL   Albumin 3.5 3.5 - 5.0 g/dL   GFR calc non Af Amer 13 (L) >60 mL/min   GFR calc Af Amer 15 (L) >60 mL/min   Anion gap 11 5 - 15    Comment: Performed at [facility], [place] [address] [place], [place] [address]  CBC     Status: Abnormal   Collection Time: 01/16/19  7:01 AM  Result Value Ref Range   WBC 5.8 4.0 - 10.5 K/uL   RBC 2.76 (L) 4.22 - 5.81 MIL/uL   Hemoglobin 8.2 (L) 13.0 - 17.0 g/dL   HCT 25.5 (L) 39.0 - 52.0 %   MCV 92.4 80.0 - 100.0 fL   MCH 29.7 26.0 - 34.0 pg   MCHC 32.2 30.0 - 36.0 g/dL   RDW 16.2 (H) 11.5 - 15.5 %   Platelets 190 150 - 400 K/uL   nRBC 0.0 0.0 - 0.2 %    Comment: Performed at [facility], [address], [place], [place] [address]    Blood Alcohol level:  No results found for: [facility]  Metabolic Disorder Labs: Lab Results  Component Value Date   HGBA1C 5.5 01/08/2019   MPG 111.15 01/08/2019   No results found for: PROLACTIN Lab Results  Component Value Date   CHOL 101 01/08/2019   TRIG 156 (H) 01/08/2019   HDL 33 (L) 01/08/2019   CHOLHDL 3.1 01/08/2019   VLDL 31 01/08/2019   LDLCALC 37 01/08/2019    Physical Findings: AIMS:  , ,  ,  ,    CIWA:    COWS:     Musculoskeletal: Strength & Muscle Tone: within normal limits Gait & Station: normal Patient leans: N/A  Psychiatric Specialty Exam: Physical Exam  Nursing note and vitals reviewed. Constitutional: He appears well-developed and well-nourished.  HENT:  Head: Normocephalic and atraumatic.  Eyes: Pupils  are equal, round, and reactive to light. Conjunctivae are normal.  Neck: Normal range of motion.  Cardiovascular: Regular rhythm and normal heart sounds.  Respiratory: Effort normal. No respiratory distress.  GI: Soft.  Musculoskeletal: Normal range of motion.  Neurological: He is alert.  Skin: Skin is warm and dry.     Psychiatric: He has a normal mood and affect. His speech is normal and behavior is normal. Judgment and thought content normal. His affect is not blunt. His speech is not delayed. Cognition and memory are normal.    Review of Systems  Constitutional: Negative.   HENT: Negative.   Eyes: Negative.   Respiratory: Negative.   Cardiovascular: Negative.   Gastrointestinal: Negative.   Musculoskeletal: Negative.   Skin: Negative.   Neurological: Negative.   Psychiatric/Behavioral: Negative.     Blood pressure 128/78, pulse 78, temperature 98.2 F (36.8 C), temperature source Oral, resp. rate 16, height 5\' 1"  (1.549 m), weight 48.7 kg, SpO2 100 %.Body mass index is 20.29 kg/m.  General Appearance: Casual  Eye Contact:  Good  Speech:  Clear and Coherent  Volume:  Normal  Mood:  Euthymic  Affect:  Congruent  Thought Process:  Goal Directed  Orientation:  Full (Time, Place, and Person)  Thought Content:  Logical  Suicidal Thoughts:  No  Homicidal Thoughts:  No  Memory:  Immediate;   Fair Recent;   Fair Remote;   Fair  Judgement:  Fair  Insight:  Fair  Psychomotor Activity:  Normal  Concentration:  Concentration: Fair  Recall:  [employer] of Knowledge:  Fair  Language:  Fair  Akathisia:  No  Handed:  Right  AIMS (if indicated):     Assets:  Desire for Improvement  ADL's:  Intact  Cognition:  WNL  Sleep:  Number of Hours: 6.3     Treatment Plan Summary: Daily contact with patient to assess and evaluate symptoms and progress in treatment, Medication management and Plan As far as the disposition, we did confirm that he cannot go back to live with his  father.  There are no shelters in the area that are taking new clients particularly not ones with medical issues.  Patient has expressed a willingness to consider going to a group home although his income is limited and we may be limited both by the current viral situation and by the fact that he needs dialysis.  Patient had requested his telephone so that he could try to contact his daughter and perhaps other relatives to look for a place to live.  I am not sure at this point what the outcome of that was.  Patient will be encouraged to come up with any idea that can be quickly executed otherwise I have also talked with social work about investigating what options could be available as far as a group home.  1 other option would be for the patient to go to a hotel locally although we would need to be careful about making sure he has his dialysis set up.  I have ordered a wound care consult for the wound on his leg and also gone ahead and ordered triple antibiotic ointment along with the bandaging.  Alethia Berthold, MD 01/16/2019, 12:53 PM

## 2019-01-16 NOTE — Plan of Care (Signed)
Pt denies SI, HI and is not a danger to himself or others. Pt rates depression as a "zero" and anxiety a "zero". Pt states that he will find out information on a place to stay (outside of [place]) from a friend today. I encouraged pt to attend groups. Collier Bullock RN Problem: Education: Goal: Freight forwarder Education information/materials will improve Outcome: Progressing   Problem: Education: Goal: Emotional status will improve Outcome: Progressing   Problem: Education: Goal: Mental status will improve Outcome: Progressing   Problem: Education: Goal: Verbalization of understanding the information provided will improve Outcome: Progressing   Problem: Safety: Goal: Periods of time without injury will increase Outcome: Progressing   Problem: Education: Goal: Utilization of techniques to improve thought processes will improve Outcome: Progressing   Problem: Education: Goal: Knowledge of the prescribed therapeutic regimen will improve Outcome: Progressing

## 2019-01-16 NOTE — [facility] Group Notes (Signed)
Feelings Around Relapse 01/16/2019 1PM  Type of Therapy and Topic:  Group Therapy:  Feelings around Relapse and Recovery  Participation Level:  Active   Description of Group:    Patients in this group will discuss emotions they experience before and after a relapse. They will process how experiencing these feelings, or avoidance of experiencing them, relates to having a relapse. Facilitator will guide patients to explore emotions they have related to recovery. Patients will be encouraged to process which emotions are more powerful. They will be guided to discuss the emotional reaction significant others in their lives may have to patients' relapse or recovery. Patients will be assisted in exploring ways to respond to the emotions of others without this contributing to a relapse.  Therapeutic Goals: 1. Patient will identify two or more emotions that lead to a relapse for them 2. Patient will identify two emotions that result when they relapse 3. Patient will identify two emotions related to recovery 4. Patient will demonstrate ability to communicate their needs through discussion and/or role plays   Summary of Patient Progress:  Actively and appropriately engaged in the group. Patient was able to provide support and validation to other group members.Patient practiced active listening when interacting with the facilitator and other group members.      Therapeutic Modalities:   Cognitive Behavioral Therapy Solution-Focused Therapy Assertiveness Training Relapse Prevention Therapy   Yvette Rack, LCSW 01/16/2019 2:07 PM

## 2019-01-17 MED ORDER — LORAZEPAM 1 MG PO TABS
1.0000 mg | ORAL_TABLET | Freq: Once | ORAL | Status: AC
  Administered 2019-01-17: 1 mg via ORAL
  Filled 2019-01-17: qty 1

## 2019-01-17 MED ORDER — IPRATROPIUM-ALBUTEROL 0.5-2.5 (3) MG/3ML IN SOLN
3.0000 mL | Freq: Once | RESPIRATORY_TRACT | Status: AC
  Administered 2019-01-17: 3 mL via RESPIRATORY_TRACT
  Filled 2019-01-17: qty 3

## 2019-01-17 MED ORDER — SODIUM CHLORIDE FLUSH 0.9 % IV SOLN
INTRAVENOUS | Status: AC
  Filled 2019-01-17: qty 3

## 2019-01-17 NOTE — Progress Notes (Signed)
Patient returned to unit from dialysis at this time. Per dialysis nurse Freida Busman, 1500 mL of fluid drawn off of patient. Tolerated procedure well. Post weight 106.5 lbs. Freida Busman, RN made aware. Patient ate his lunch tray upon arrival to the unit.

## 2019-01-17 NOTE — Plan of Care (Signed)
Patient is alert and oriented to person, place, time and situation.Stated, "I had a spell last night where I couldn't breathe, they gave me some medicine and it helped me sleep a little better." Pin sized open area to R) ankle cleansed and band aid reapplied. Patient taken to dialysis today via staff. Report given to dialysis nurse Freida Busman, RN at approx, [phone]. Milieu remains safe with q 15 minute safety checks. Will continue to monitor.

## 2019-01-17 NOTE — [facility] Group Notes (Signed)
[place] Group Notes:  (Nursing/MHT/Case Management/Adjunct)  Date:  01/17/2019  Time:  11:55 PM  Type of Therapy:  Group Therapy  Participation Level:  Did Not Attend   Brett Small 01/17/2019, 11:55 PM

## 2019-01-17 NOTE — Progress Notes (Signed)
[facility]  ROUNDING NOTE   Subjective:   Seen and examined on hemodialysis. Seated in chair. Tolerated treatment. UF goal of 2 liters    HEMODIALYSIS FLOWSHEET:  Blood Flow Rate (mL/min): 400 mL/min Arterial Pressure (mmHg): -160 mmHg Venous Pressure (mmHg): 120 mmHg Transmembrane Pressure (mmHg): 60 mmHg Ultrafiltration Rate (mL/min): 860 mL/min Dialysate Flow Rate (mL/min): 600 ml/min Conductivity: Machine : 13.9 Conductivity: Machine : 13.9 Dialysis Fluid Bolus: Normal Saline Bolus Amount (mL): 250 mL     Objective:  Vital signs in last 24 hours:  Temp:  [98.1 F (36.7 C)] 98.1 F (36.7 C) (03/28 0915) Pulse Rate:  [80-102] 81 (03/28 1100) Resp:  [16] 16 (03/28 1100) BP: (122-146)/(70-81) 127/72 (03/28 1100) SpO2:  [95 %-100 %] 99 % (03/28 1100)  Weight change:  Filed Weights   01/15/19 0621 01/15/19 1025 01/15/19 1325  Weight: 49.7 kg 49.7 kg 48.7 kg    Intake/Output: No intake/output data recorded.   Intake/Output this shift:  No intake/output data recorded.  Physical Exam: General: No acute distress  Head: Normocephalic, atraumatic. Moist oral mucosal membranes  Eyes: Anicteric  Neck: Supple, trachea midline  Lungs:  Clear to auscultation, normal effort  Heart: regular  Abdomen:  Soft, nontender, bowel sounds present  Extremities: No peripheral edema.  Neurologic: Awake, alert, oriented  Skin: No lesions  Psych Depressed affect  Access: Left internal jugular PermCath, left arm AVG +thrill and +bruit    Basic Metabolic Panel: Recent Labs  Lab 01/13/19 0932 01/14/19 0651 01/16/19 0701  NA  --  136 136  K  --  4.5 4.6  CL  --  101 100  CO2  --  26 25  GLUCOSE  --  93 113*  BUN  --  37* 34*  CREATININE  --  5.56* 5.13*  CALCIUM  --  8.6* 8.4*  PHOS 3.0 2.4* 2.7    Liver Function Tests: Recent Labs  Lab 01/14/19 0651 01/16/19 0701  ALBUMIN 3.6 3.5   No results for input(s): LIPASE, AMYLASE in the last 168 hours. No  results for input(s): AMMONIA in the last 168 hours.  CBC: Recent Labs  Lab 01/12/19 0707 01/14/19 0651 01/16/19 0701  WBC 7.0 7.0 5.8  HGB 9.0* 8.3* 8.2*  HCT 27.8* 25.6* 25.5*  MCV 89.7 90.1 92.4  PLT 195 199 190    Cardiac Enzymes: No results for input(s): CKTOTAL, CKMB, CKMBINDEX, TROPONINI in the last 168 hours.  BNP: Invalid input(s): POCBNP  CBG: No results for input(s): GLUCAP in the last 168 hours.  Microbiology: Results for orders placed or performed during the hospital encounter of 12/24/18  MRSA PCR Screening     Status: None   Collection Time: 12/27/18  1:20 PM  Result Value Ref Range Status   MRSA by PCR NEGATIVE NEGATIVE Final    Comment:        The GeneXpert MRSA Assay (FDA approved for NASAL specimens only), is one component of a comprehensive MRSA colonization surveillance program. It is not intended to diagnose MRSA infection nor to guide or monitor treatment for MRSA infections. Performed at [facility], [place] [address], [place], [place] [address]     Coagulation Studies: No results for input(s): LABPROT, INR in the last 72 hours.  Urinalysis: No results for input(s): COLORURINE, LABSPEC, PHURINE, GLUCOSEU, HGBUR, BILIRUBINUR, KETONESUR, PROTEINUR, UROBILINOGEN, NITRITE, LEUKOCYTESUR in the last 72 hours.  Invalid input(s): APPERANCEUR    Imaging: No results found.   Medications:   .  sodium chloride    . sodium chloride    . [START ON 01/19/2019]  ceFAZolin (ANCEF) IV     . sodium chloride flush      . amLODipine  5 mg Oral Daily  . apixaban  2.5 mg Oral BID  . aspirin EC  81 mg Oral Daily  . atorvastatin  80 mg Oral QHS  . carvedilol  25 mg Oral BID WC  . Chlorhexidine Gluconate Cloth  6 each Topical Q0600  . epoetin (EPOGEN/PROCRIT) injection  10,000 Units Intravenous Q T,Th,Sa-HD  . escitalopram  10 mg Oral Daily  . gabapentin  100 mg Oral TID  . hydrALAZINE  25 mg Oral Q8H  . multivitamin  1 tablet Oral Daily  .  neomycin-bacitracin-polymyxin   Topical BID  . sevelamer carbonate  1,600 mg Oral TID WC   sodium chloride, sodium chloride, acetaminophen, alteplase, heparin, heparin, hydrOXYzine, ibuprofen, lidocaine (PF), lidocaine-prilocaine, pentafluoroprop-tetrafluoroeth  Assessment/ Plan:  Brett Small is a 43 y.o. white male with end stage renal disease on hemodialysis, hypertension, depression, aortobifemoral bypass, who was admitted to [facility] on 01/07/2019 for evaluation and treatment of major depressive disorder.  [facility]   1.  ESRD: seen and examined on hemodialysis treatment. Tolerating treatment well.   2.  Anemia chronic kidney disease:   - EPO with HD treatment  3.  Secondary hyperparathyroidism with hyperphosphatemia:  - Sevelamer with meals  4.  Hypertension.   Current regimen of amlodipine, carvedilol, hydralazine.    5. Dialysis access device: maturing left AVG placed on 3/6 by Dr. Carlis Abbott. Now with some numbness - Will have vascular assessed on Monday. Appreciate vascular surgery input.    LOS: 10 Pennie Vanblarcom 3/28/202011:10 AM

## 2019-01-17 NOTE — Plan of Care (Signed)
Patient stated he had a good day and enjoyed his time outside playing basketball.   Problem: Education: Goal: Emotional status will improve Outcome: Progressing Goal: Mental status will improve Outcome: Progressing

## 2019-01-17 NOTE — [facility] Group Notes (Signed)
LCSW Group Therapy Note  01/17/2019 1:15pm  Type of Therapy and Topic:  Group Therapy:  Cognitive Distortions  Participation Level:  Active   Description of Group:    Patients in this group will be introduced to the topic of cognitive distortions.  Patients will identify and describe cognitive distortions, describe the feelings these distortions create for them.  Patients will identify one or more situations in their personal life where they have cognitively distorted thinking and will verbalize challenging this cognitive distortion through positive thinking skills.  Patients will practice the skill of using positive affirmations to challenge cognitive distortions using affirmation cards.    Therapeutic Goals:  1. Patient will identify two or more cognitive distortions they have used 2. Patient will identify one or more emotions that stem from use of a cognitive distortion 3. Patient will demonstrate use of a positive affirmation to counter a cognitive distortion through discussion and/or role play. 4. Patient will describe one way cognitive distortions can be detrimental to wellness   Summary of Patient Progress: The patient reported that he  feels "alright" Patients were introduced to the topic of cognitive distortions.Thepatient was able to identify and describe cognitive distortions, described the feelings these distortions create for him. Patient identified a situation in his  personal life where he has cognitively distorted thinking and was able to verbalize and challenged this cognitive distortion through positive thinking skills.Patient was able to provide support and validation to other group members.     Therapeutic Modalities:   Cognitive Behavioral Therapy Motivational Interviewing   Ramanda Paules  CUEBAS-COLON, LCSW 01/17/2019 10:26 AM

## 2019-01-17 NOTE — Progress Notes (Signed)
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    Patient seen chart reviewed.  Brett Small reports feeling ok, and wants to get phone number off Brett Small cell phone so that Brett Small can call Brett Small friends to see if there is any alternative to Group home.  Brett Small said that Brett Small would go to [facility] if Brett Small has not other options.   NO SI. Tolerates meds.   Principal Problem: Depression Diagnosis: Principal Problem:   Depression Active Problems:   End stage renal disease ([place])   Hypertension   Homelessness  Total Time spent with patient: 20 minutes  Past Psychiatric History: Patient has no significant past psychiatric history  Past Medical History:  Past Medical History:  Diagnosis Date  . Hypertension   . Renal disorder     Past Surgical History:  Procedure Laterality Date  . AORTA - FEMORAL ARTERY BYPASS GRAFT    . AV FISTULA PLACEMENT Left 12/26/2018   Procedure: INSERTION OF GORE STRETCH VASCULAR 4-7MM X  45CM IN LEFT UPPER ARM;  Surgeon: Marty Heck, MD;  Location: [facility] OR;  Service: Vascular;  Laterality: Left;   Family History:  Family History  Problem Relation Age of Onset  . Hypertension Other   . Diabetes Other   . Clotting disorder Father    Family Psychiatric  History: The previous Social History:  Social History   Substance and Sexual Activity  Alcohol Use Not Currently     Social History   Substance and Sexual Activity  Drug Use Never    Social History   Socioeconomic History  . Marital status: Single    Spouse name: Not on file  . Number of children: Not on file  . Years of education: Not on file  . Highest education level: Not on file  Occupational History  . Not on file  Social Needs  . Financial resource strain: Not on file  . Food insecurity:    Worry: Not on file    Inability: Not on file  . Transportation needs:    Medical:  Not on file    Non-medical: Not on file  Tobacco Use  . Smoking status: Former Smoker    Last attempt to quit: 06/26/2018    Years since quitting: 0.5  . Smokeless tobacco: Never Used  . Tobacco comment: smoked for 30 years   Substance and Sexual Activity  . Alcohol use: Not Currently  . Drug use: Never  . Sexual activity: Not on file  Lifestyle  . Physical activity:    Days per week: Not on file    Minutes per session: Not on file  . Stress: Not on file  Relationships  . Social connections:    Talks on phone: Not on file    Gets together: Not on file    Attends religious service: Not on file    Active member of club or organization: Not on file    Attends meetings of clubs or organizations: Not on file    Relationship status: Not on file  Other Topics Concern  . Not on file  Social History Narrative  . Not on file   Additional Social History:   Sleep: Fair  Appetite:  Fair  Current Medications: Current Facility-Administered Medications  Medication Dose Route Frequency Provider Last Rate Last Dose  . 0.9 %  sodium chloride infusion  100 mL Intravenous PRN Lateef,  Munsoor, MD      . 0.9 %  sodium chloride infusion  100 mL Intravenous PRN Lateef, Munsoor, MD      . acetaminophen (TYLENOL) tablet 650 mg  650 mg Oral Q6H PRN Tennis Ship, MD   650 mg at 01/14/19 0219  . alteplase (CATHFLO ACTIVASE) injection 2 mg  2 mg Intracatheter Once PRN Lateef, Munsoor, MD      . amLODipine (NORVASC) tablet 5 mg  5 mg Oral Daily Tennis Ship, MD   5 mg at 01/17/19 0809  . apixaban (ELIQUIS) tablet 2.5 mg  2.5 mg Oral BID Clapacs, Madie Reno, MD   2.5 mg at 01/17/19 0809  . aspirin EC tablet 81 mg  81 mg Oral Daily Tennis Ship, MD   81 mg at 01/17/19 0808  . atorvastatin (LIPITOR) tablet 80 mg  80 mg Oral QHS Tennis Ship, MD   80 mg at 01/16/19 2144  . carvedilol (COREG) tablet 25 mg  25 mg Oral BID WC Tennis Ship, MD   25 mg at 01/17/19 0809  . [START ON 01/19/2019] ceFAZolin  (ANCEF) IVPB 1 g/50 mL premix  1 g Intravenous Once Stegmayer, Kimberly A, PA-C      . Chlorhexidine Gluconate Cloth 2 % PADS 6 each  6 each Topical Q0600 Holley Raring, Munsoor, MD   6 each at 01/15/19 0622  . epoetin alfa (EPOGEN,PROCRIT) injection 10,000 Units  10,000 Units Intravenous Q T,Th,Sa-HD Holley Raring, Munsoor, MD   10,000 Units at 01/17/19 1253  . escitalopram (LEXAPRO) tablet 10 mg  10 mg Oral Daily Tennis Ship, MD   10 mg at 01/17/19 0809  . gabapentin (NEURONTIN) capsule 100 mg  100 mg Oral TID Clapacs, Madie Reno, MD   100 mg at 01/17/19 0808  . heparin injection 1,000 Units  1,000 Units Dialysis PRN Lateef, Munsoor, MD      . heparin injection 2,000 Units  2,000 Units Dialysis PRN Roney Jaffe, MD      . hydrALAZINE (APRESOLINE) tablet 25 mg  25 mg Oral Q8H Tennis Ship, MD   Stopped at 01/17/19 0503  . hydrOXYzine (ATARAX/VISTARIL) tablet 25 mg  25 mg Oral TID PRN Tennis Ship, MD   25 mg at 01/17/19 0423  . ibuprofen (ADVIL,MOTRIN) tablet 400 mg  400 mg Oral Q6H PRN Clapacs, Madie Reno, MD   400 mg at 01/15/19 2146  . lidocaine (PF) (XYLOCAINE) 1 % injection 5 mL  5 mL Intradermal PRN Lateef, Munsoor, MD      . lidocaine-prilocaine (EMLA) cream 1 application  1 application Topical PRN Lateef, Munsoor, MD      . multivitamin (RENA-VIT) tablet 1 tablet  1 tablet Oral Daily Tennis Ship, MD   1 tablet at 01/17/19 0809  . neomycin-bacitracin-polymyxin (NEOSPORIN) ointment   Topical BID Clapacs, John T, MD      . pentafluoroprop-tetrafluoroeth (GEBAUERS) aerosol 1 application  1 application Topical PRN Lateef, Munsoor, MD      . sevelamer carbonate (RENVELA) tablet 1,600 mg  1,600 mg Oral TID WC Tennis Ship, MD   1,600 mg at 01/17/19 0808  . sodium chloride flush 0.9 % injection             Lab Results:  Results for orders placed or performed during the hospital encounter of 01/07/19 (from the past 48 hour(s))  Renal function panel     Status: Abnormal   Collection Time: 01/16/19   7:01 AM  Result Value Ref Range   Sodium 136 135 - 145  mmol/L   Potassium 4.6 3.5 - 5.1 mmol/L   Chloride 100 98 - 111 mmol/L   CO2 25 22 - 32 mmol/L   Glucose, Bld 113 (H) 70 - 99 mg/dL   BUN 34 (H) 6 - 20 mg/dL   Creatinine, Ser 5.13 (H) 0.61 - 1.24 mg/dL   Calcium 8.4 (L) 8.9 - 10.3 mg/dL   Phosphorus 2.7 2.5 - 4.6 mg/dL   Albumin 3.5 3.5 - 5.0 g/dL   GFR calc non Af Amer 13 (L) >60 mL/min   GFR calc Af Amer 15 (L) >60 mL/min   Anion gap 11 5 - 15    Comment: Performed at [facility], [address] [place], [place] [address]  CBC     Status: Abnormal   Collection Time: 01/16/19  7:01 AM  Result Value Ref Range   WBC 5.8 4.0 - 10.5 K/uL   RBC 2.76 (L) 4.22 - 5.81 MIL/uL   Hemoglobin 8.2 (L) 13.0 - 17.0 g/dL   HCT 25.5 (L) 39.0 - 52.0 %   MCV 92.4 80.0 - 100.0 fL   MCH 29.7 26.0 - 34.0 pg   MCHC 32.2 30.0 - 36.0 g/dL   RDW 16.2 (H) 11.5 - 15.5 %   Platelets 190 150 - 400 K/uL   nRBC 0.0 0.0 - 0.2 %    Comment: Performed at [facility], [address] [place], [place] [address]    Blood Alcohol level:  No results found for: [facility]  Metabolic Disorder Labs: Lab Results  Component Value Date   HGBA1C 5.5 01/08/2019   MPG 111.15 01/08/2019   No results found for: PROLACTIN Lab Results  Component Value Date   CHOL 101 01/08/2019   TRIG 156 (H) 01/08/2019   HDL 33 (L) 01/08/2019   CHOLHDL 3.1 01/08/2019   VLDL 31 01/08/2019   LDLCALC 37 01/08/2019    Physical Findings: AIMS:  , ,  ,  ,    CIWA:    COWS:     Musculoskeletal: Strength & Muscle Tone: within normal limits Gait & Station: normal Patient leans: N/A  Psychiatric Specialty Exam: Physical Exam  Nursing note and vitals reviewed. Constitutional: Brett Small appears well-developed and well-nourished.  HENT:  Head: Normocephalic and atraumatic.  Eyes: Pupils are equal, round, and reactive to light. Conjunctivae are normal.  Neck: Normal range of motion.  Cardiovascular: Regular rhythm  and normal heart sounds.  Respiratory: Effort normal. No respiratory distress.  GI: Soft.  Musculoskeletal: Normal range of motion.  Neurological: Brett Small is alert.  Skin: Skin is warm and dry.     Psychiatric: Brett Small has a normal mood and affect. Brett Small speech is normal and behavior is normal. Judgment and thought content normal. Brett Small affect is not blunt. Brett Small speech is not delayed. Cognition and memory are normal.    Review of Systems  Constitutional: Negative.   HENT: Negative.   Eyes: Negative.   Respiratory: Negative.   Cardiovascular: Negative.   Gastrointestinal: Negative.   Musculoskeletal: Negative.   Skin: Negative.   Neurological: Negative.   Psychiatric/Behavioral: Negative.     Blood pressure (!) 142/88, pulse 89, temperature 98.2 F (36.8 C), temperature source Tympanic, resp. rate 16, height 5\' 1"  (1.549 m), weight 48.3 kg, SpO2 98 %.Body mass index is 20.12 kg/m.  General Appearance: Casual  Eye Contact:  Good  Speech:  Clear and Coherent  Volume:  Normal  Mood:  Euthymic  Affect:  Congruent  Thought Process:  Goal Directed  Orientation:  Full (Time, Place, and Person)  Thought Content:  Logical  Suicidal Thoughts:  No  Homicidal Thoughts:  No  Memory:  Immediate;   Fair Recent;   Fair Remote;   Fair  Judgement:  Fair  Insight:  Fair  Psychomotor Activity:  Normal  Concentration:  Concentration: Fair  Recall:  [employer] of Knowledge:  Fair  Language:  Fair  Akathisia:  No  Handed:  Right  AIMS (if indicated):     Assets:  Desire for Improvement  ADL's:  Intact  Cognition:  WNL  Sleep:  Number of Hours: 5.25     Treatment Plan Summary: Daily contact with patient to assess and evaluate symptoms and progress in treatment, Medication management and Plan As far as the disposition, we did confirm that Arra Connaughton cannot go back to live with Brett Small father.  There are no shelters in the area that are taking new clients particularly not ones with medical issues.  Patient has  expressed a willingness to consider going to a group home although Brett Small income is limited and we may be limited both by the current viral situation and by the fact that Aleyna Cueva needs dialysis.  Patient had requested Brett Small telephone so that Aneesh Faller could try to contact Brett Small daughter and perhaps other relatives to look for a place to live.  I am not sure at this point what the outcome of that was.  Patient will be encouraged to come up with any idea that can be quickly executed otherwise I have also talked with social work about investigating what options could be available as far as a group home.  1 other option would be for the patient to go to a hotel locally although we would need to be careful about making sure [facility] has Brett Small dialysis set up.  I have ordered a wound care consult for the wound on Brett Small leg and also gone ahead and ordered triple antibiotic ointment along with the bandaging.   # MDD -- continue Lexapro 10mg  daily.  -- continue Neurontin 100mg  TID  # ESRD: -- continue HD  # Dispo -- defer to primary team, regarding house. [facility] might be an option if pt doesn't have other family or friend's support.    Leandrew Keech, MD 01/17/2019, 2:30 PM

## 2019-01-17 NOTE — Progress Notes (Addendum)
D - Patient was in the day room upon arrival to the unit. Patient was pleasant during assessment and medication administration. Patient denies SI/HI/AVH. =Patient stated that he had a really good day, he spent some time outside playing basketball and he really enjoyed that. Patient called the nurses station at 0430 stating, "I am having trouble breathing." VS were gathered and 02 was 98% on room air. No crackles were heard upon osculation of the anterior and posterior lungs. MD notified, See MAR.    A - Patient was compliant with medication administration per MD orders and procedures on the unit. Patient given education. Patient given support and encouragement to be active in his treatment plan. Patient informed to let staff know if there are any issues or problems on the unit.   R - Patient being monitored Q 15 minutes for safety per unit protocol. Patient remains safe on the unit.

## 2019-01-18 LAB — RENAL FUNCTION PANEL
Albumin: 3.6 g/dL (ref 3.5–5.0)
Anion gap: 11 (ref 5–15)
BUN: 28 mg/dL — ABNORMAL HIGH (ref 6–20)
CO2: 27 mmol/L (ref 22–32)
Calcium: 8.4 mg/dL — ABNORMAL LOW (ref 8.9–10.3)
Chloride: 101 mmol/L (ref 98–111)
Creatinine, Ser: 5.1 mg/dL — ABNORMAL HIGH (ref 0.61–1.24)
GFR calc Af Amer: 15 mL/min — ABNORMAL LOW (ref >60)
GFR calc non Af Amer: 13 mL/min — ABNORMAL LOW (ref >60)
Glucose, Bld: 93 mg/dL (ref 70–99)
Phosphorus: 2.9 mg/dL (ref 2.5–4.6)
Potassium: 4 mmol/L (ref 3.5–5.1)
SODIUM: 139 mmol/L (ref 135–145)

## 2019-01-18 LAB — CBC
HCT: 28.8 % — ABNORMAL LOW (ref 39.0–52.0)
Hemoglobin: 9.2 g/dL — ABNORMAL LOW (ref 13.0–17.0)
MCH: 30.3 pg (ref 26.0–34.0)
MCHC: 31.9 g/dL (ref 30.0–36.0)
MCV: 94.7 fL (ref 80.0–100.0)
Platelets: 192 10*3/uL (ref 150–400)
RBC: 3.04 MIL/uL — ABNORMAL LOW (ref 4.22–5.81)
RDW: 18.8 % — ABNORMAL HIGH (ref 11.5–15.5)
WBC: 5.8 10*3/uL (ref 4.0–10.5)
nRBC: 0 % (ref 0.0–0.2)

## 2019-01-18 NOTE — Progress Notes (Signed)
Patient is alert and oriented, complained of pain in the L) arm. Some swelling to that extremity noted. MD made aware of acute finding. Patient compliant with treatment plan. Reports that he slept fair with the help of a sleep aid. Appetite is good with normal energy level. Rates his depression and hopelessness a 0 and his anxiety a 5. PRN Vistaril administered to combat his anxiety. His goal for today is to make arrangements for his discharge. Milieu remains safe with q 15 minute safety checks. Will continue to monitor.

## 2019-01-18 NOTE — Plan of Care (Signed)
Patient is alert and oriented, complained of pain in the L) arm. Some swelling to that extremity noted. MD made aware of acute finding. Patient compliant with treatment plan. Reports that he slept fair with the help of a sleep aid. Appetite is good with normal energy level. Rates his depression and hopelessness a 0 and his anxiety a 5. PRN Vistaril administered to combat his anxiety. His goal for today is to make arrangements for his discharge. [place]ieu remains safe with q 15 minute safety checks. Will continue to monitor.

## 2019-01-18 NOTE — Plan of Care (Signed)
D: Patient has been pleasant and cooperative. Denies SI, HI and AV hallucinations. Mood is pleasant. Affect is congruent. Med compliant. A: Continue to monitor for safety and offer support. R: Safety maintained.

## 2019-01-18 NOTE — Progress Notes (Signed)
[facility] MD Progress Note  01/18/2019 11:44 AM Brett Small  MRN:  [id number]   Subjective: Follow-up for this gentleman with end-stage renal failure on Small dysthymia and homelessness.    Patient seen chart reviewed.  Brett Small reports feeling well emotionally, but complain about arm pain, where the an incision was made about 2-3 weeks ago for his HD access.  It was slightly warm but no discharge.  Pt has no fever or no increase WBC. Thus, pt is reassured and will provide ibuprofen.  Pt was also instructed to ask Small nurse tomorrow during Small.   Otherwise, not SI or HI.  Doing well on the floor.   Principal Problem: Depression Diagnosis: Principal Problem:   Depression Active Problems:   End stage renal disease ([place])   Hypertension   Homelessness  Total Time spent with patient: 20 minutes  Past Psychiatric History: Patient has no significant past psychiatric history  Past Medical History:  Past Medical History:  Diagnosis Date  . Hypertension   . Renal disorder     Past Surgical History:  Procedure Laterality Date  . AORTA - FEMORAL ARTERY BYPASS GRAFT    . AV FISTULA PLACEMENT Left 12/26/2018   Procedure: INSERTION OF GORE STRETCH VASCULAR 4-7MM X  45CM IN LEFT UPPER ARM;  Surgeon: Marty Heck, MD;  Location: [facility];  Service: Vascular;  Laterality: Left;   Family History:  Family History  Problem Relation Age of Onset  . Hypertension Other   . Diabetes Other   . Clotting disorder Father    Family Psychiatric  History: The previous Social History:  Social History   Substance and Sexual Activity  Alcohol Use Not Currently     Social History   Substance and Sexual Activity  Drug Use Never    Social History   Socioeconomic History  . Marital status: Single    Spouse name: Not on file  . Number of children: Not on file  . Years of education: Not on file  . Highest education level: Not on file  Occupational History  . Not on file  Social Needs  .  Financial resource strain: Not on file  . Food insecurity:    Worry: Not on file    Inability: Not on file  . Transportation needs:    Medical: Not on file    Non-medical: Not on file  Tobacco Use  . Smoking status: Former Smoker    Last attempt to quit: 06/26/2018    Years since quitting: 0.5  . Smokeless tobacco: Never Used  . Tobacco comment: smoked for 30 years   Substance and Sexual Activity  . Alcohol use: Not Currently  . Drug use: Never  . Sexual activity: Not on file  Lifestyle  . Physical activity:    Days per week: Not on file    Minutes per session: Not on file  . Stress: Not on file  Relationships  . Social connections:    Talks on phone: Not on file    Gets together: Not on file    Attends religious service: Not on file    Active member of club or organization: Not on file    Attends meetings of clubs or organizations: Not on file    Relationship status: Not on file  Other Topics Concern  . Not on file  Social History Narrative  . Not on file   Additional Social History:   Sleep: Fair  Appetite:  Fair  Current Medications: Current Facility-Administered Medications  Medication Dose Route Frequency Provider Last Rate Last Dose  . 0.9 %  sodium chloride infusion  100 mL Intravenous PRN Lateef, Munsoor, MD      . 0.9 %  sodium chloride infusion  100 mL Intravenous PRN Lateef, Munsoor, MD      . acetaminophen (TYLENOL) tablet 650 mg  650 mg Oral Q6H PRN Tennis Ship, MD   650 mg at 01/14/19 0219  . alteplase (CATHFLO ACTIVASE) injection 2 mg  2 mg Intracatheter Once PRN Lateef, Munsoor, MD      . amLODipine (NORVASC) tablet 5 mg  5 mg Oral Daily Tennis Ship, MD   5 mg at 01/18/19 0814  . apixaban (ELIQUIS) tablet 2.5 mg  2.5 mg Oral BID Clapacs, Madie Reno, MD   2.5 mg at 01/18/19 0813  . aspirin EC tablet 81 mg  81 mg Oral Daily Tennis Ship, MD   81 mg at 01/18/19 0814  . atorvastatin (LIPITOR) tablet 80 mg  80 mg Oral QHS Tennis Ship, MD   80 mg at  01/17/19 2117  . carvedilol (COREG) tablet 25 mg  25 mg Oral BID WC Tennis Ship, MD   25 mg at 01/18/19 0814  . [START ON 01/19/2019] ceFAZolin (ANCEF) IVPB 1 g/50 mL premix  1 g Intravenous Once Stegmayer, Kimberly A, PA-C      . Chlorhexidine Gluconate Cloth 2 % PADS 6 each  6 each Topical Q0600 Holley Raring, Munsoor, MD   6 each at 01/18/19 0612  . epoetin alfa (EPOGEN,PROCRIT) injection 10,000 Units  10,000 Units Intravenous Q T,Th,Sa-HD Holley Raring, Munsoor, MD   10,000 Units at 01/17/19 1253  . escitalopram (LEXAPRO) tablet 10 mg  10 mg Oral Daily Tennis Ship, MD   10 mg at 01/18/19 0814  . gabapentin (NEURONTIN) capsule 100 mg  100 mg Oral TID Clapacs, John T, MD   100 mg at 01/18/19 1135  . heparin injection 1,000 Units  1,000 Units Small PRN Lateef, Munsoor, MD      . heparin injection 2,000 Units  2,000 Units Small PRN Roney Jaffe, MD      . hydrALAZINE (APRESOLINE) tablet 25 mg  25 mg Oral Q8H O'Neal, Sarita, MD   25 mg at 01/18/19 0630  . hydrOXYzine (ATARAX/VISTARIL) tablet 25 mg  25 mg Oral TID PRN Tennis Ship, MD   25 mg at 01/18/19 1601  . ibuprofen (ADVIL,MOTRIN) tablet 400 mg  400 mg Oral Q6H PRN Clapacs, Madie Reno, MD   400 mg at 01/15/19 2146  . lidocaine (PF) (XYLOCAINE) 1 % injection 5 mL  5 mL Intradermal PRN Lateef, Munsoor, MD      . lidocaine-prilocaine (EMLA) cream 1 application  1 application Topical PRN Lateef, Munsoor, MD      . multivitamin (RENA-VIT) tablet 1 tablet  1 tablet Oral Daily Tennis Ship, MD   1 tablet at 01/18/19 0820  . neomycin-bacitracin-polymyxin (NEOSPORIN) ointment   Topical BID Clapacs, John T, MD      . pentafluoroprop-tetrafluoroeth (GEBAUERS) aerosol 1 application  1 application Topical PRN Lateef, Munsoor, MD      . sevelamer carbonate (RENVELA) tablet 1,600 mg  1,600 mg Oral TID WC Tennis Ship, MD   1,600 mg at 01/18/19 1135    Lab Results:  Results for orders placed or performed during the hospital encounter of 01/07/19 (from the  past 48 hour(s))  Renal function panel     Status: Abnormal   Collection Time: 01/18/19  6:49 AM  Result Value Ref Range  Sodium 139 135 - 145 mmol/L   Potassium 4.0 3.5 - 5.1 mmol/L   Chloride 101 98 - 111 mmol/L   CO2 27 22 - 32 mmol/L   Glucose, Bld 93 70 - 99 mg/dL   BUN 28 (H) 6 - 20 mg/dL   Creatinine, Ser 5.10 (H) 0.61 - 1.24 mg/dL   Calcium 8.4 (L) 8.9 - 10.3 mg/dL   Phosphorus 2.9 2.5 - 4.6 mg/dL   Albumin 3.6 3.5 - 5.0 g/dL   GFR calc non Af Amer 13 (L) >60 mL/min   GFR calc Af Amer 15 (L) >60 mL/min   Anion gap 11 5 - 15    Comment: Performed at [facility], [address] [place], [place] [address]  CBC     Status: Abnormal   Collection Time: 01/18/19  6:49 AM  Result Value Ref Range   WBC 5.8 4.0 - 10.5 K/uL   RBC 3.04 (L) 4.22 - 5.81 MIL/uL   Hemoglobin 9.2 (L) 13.0 - 17.0 g/dL   HCT 28.8 (L) 39.0 - 52.0 %   MCV 94.7 80.0 - 100.0 fL   MCH 30.3 26.0 - 34.0 pg   MCHC 31.9 30.0 - 36.0 g/dL   RDW 18.8 (H) 11.5 - 15.5 %   Platelets 192 150 - 400 K/uL   nRBC 0.0 0.0 - 0.2 %    Comment: Performed at [facility], [address] [place], [place] [address]    Blood Alcohol level:  No results found for: [facility]  Metabolic Disorder Labs: Lab Results  Component Value Date   HGBA1C 5.5 01/08/2019   MPG 111.15 01/08/2019   No results found for: PROLACTIN Lab Results  Component Value Date   CHOL 101 01/08/2019   TRIG 156 (H) 01/08/2019   HDL 33 (L) 01/08/2019   CHOLHDL 3.1 01/08/2019   VLDL 31 01/08/2019   LDLCALC 37 01/08/2019    Musculoskeletal: Strength & Muscle Tone: within normal limits Gait & Station: normal Patient leans: N/A  Psychiatric Specialty Exam: Physical Exam  Nursing note and vitals reviewed. Constitutional: Brett Small appears well-developed and well-nourished.  HENT:  Head: Normocephalic and atraumatic.  Eyes: Pupils are equal, round, and reactive to light. Conjunctivae are normal.  Neck: Normal range of motion.   Cardiovascular: Regular rhythm and normal heart sounds.  Respiratory: Effort normal. No respiratory distress.  GI: Soft.  Musculoskeletal: Normal range of motion.  Neurological: Brett Small is alert.  Skin: Skin is warm and dry.     Psychiatric: Brett Small has a normal mood and affect. His speech is normal and behavior is normal. Judgment and thought content normal. His affect is not blunt. His speech is not delayed. Cognition and memory are normal.    Review of Systems  Constitutional: Negative.   HENT: Negative.   Eyes: Negative.   Respiratory: Negative.   Cardiovascular: Negative.   Gastrointestinal: Negative.   Musculoskeletal: Negative.   Skin: Negative.   Neurological: Negative.   Psychiatric/Behavioral: Negative.     Blood pressure 128/76, pulse 83, temperature 98 F (36.7 C), temperature source Oral, resp. rate 18, height 5\' 1"  (1.549 m), weight 48.5 kg, SpO2 99 %.Body mass index is 20.22 kg/m.  General Appearance: Casual  Eye Contact:  Good  Speech:  Clear and Coherent  Volume:  Normal  Mood:  Euthymic  Affect:  Brett Small  Thought Process:  Goal Directed  Orientation:  Full (Time, Place, and Person)  Thought Content:  Logical  Suicidal Thoughts:  No  Homicidal Thoughts:  No  Memory:  Immediate;   Fair Recent;   Fair Remote;   Fair  Judgement:  Fair  Insight:  Fair  Psychomotor Activity:  Normal  Concentration:  Concentration: Fair  Recall:  [employer] of Knowledge:  Fair  Language:  Fair  Akathisia:  No  Handed:  Right  AIMS (if indicated):     Assets:  Desire for Improvement  ADL's:  Intact  Cognition:  WNL  Sleep:  Number of Hours: 6.25     Treatment Plan Summary: Daily contact with patient to assess and evaluate symptoms and progress in treatment, Medication management and Plan As far as the disposition, we did confirm that Brett Small cannot go back to live with his father.  There are no shelters in the area that are taking new clients particularly not ones with medical  issues.  Patient has expressed a willingness to consider going to a group home although his income is limited and we may be limited both by the current viral situation and by the fact that Brett Small.  Patient had requested his telephone so that Brett Small and perhaps other relatives to look for a place to live.  I am not sure at this point what the outcome of that was.  Patient will be encouraged to come up with any idea that can be quickly executed otherwise I have also talked with social work about investigating what options could be available as far as a group home.  1 other option would be for the patient to go to a hotel locally although we would need to be careful about making sure Brett Small has his Small set up.  I have ordered a wound care consult for the wound on his leg and also gone ahead and ordered triple antibiotic ointment along with the bandaging.   # MDD -- continue Lexapro 10mg  daily.  -- continue Neurontin 100mg  TID  # ESRD: -- continue HD -- Need to follow up with his incision on his L arm for HD access.  It doesn't appear to be infected, but needs to monitor.   # Dispo -- defer to primary team, regarding house. [facility] might be an option if pt doesn't have other family or friend's support.    Kearsten Ginther, MD 01/18/2019, 11:44 AM

## 2019-01-18 NOTE — [facility] Group Notes (Signed)
LCSW Group Therapy Note 01/18/2019 1:15pm  Type of Therapy and Topic: Group Therapy: Feelings Around Returning Home & Establishing a Supportive Framework and Supporting Oneself When Supports Not Available  Participation Level: Did Not Attend  Description of Group:  Patients first processed thoughts and feelings about upcoming discharge. These included fears of upcoming changes, lack of change, new living environments, judgements and expectations from others and overall stigma of mental health issues. The group then discussed the definition of a supportive framework, what that looks and feels like, and how do to discern it from an unhealthy non-supportive network. The group identified different types of supports as well as what to do when your family/friends are less than helpful or unavailable  Therapeutic Goals  1. Patient will identify one healthy supportive network that they can use at discharge. 2. Patient will identify one factor of a supportive framework and how to tell it from an unhealthy network. 3. Patient able to identify one coping skill to use when they do not have positive supports from others. 4. Patient will demonstrate ability to communicate their needs through discussion and/or role plays.  Summary of Patient Progress:  Pt was invited to attend group but chose not to attend. CSW will continue to encourage pt to attend group throughout their admission.   Therapeutic Modalities Cognitive Behavioral Therapy Motivational Interviewing   Elanie Hammitt  CUEBAS-COLON, LCSW 01/18/2019 9:05 AM

## 2019-01-18 NOTE — Progress Notes (Signed)
D: Patient has been pleasant and cooperative. Denies SI, HI and AV hallucinations. Mood is pleasant. Affect is congruent. Med compliant. A: Continue to monitor for safety and offer support. R: Safety maintained.

## 2019-01-19 ENCOUNTER — Encounter
Admission: AD | Disposition: A | Discharge status: Home / Self Care | Payer: Self-pay | Source: Intra-hospital | Attending: Psychiatry

## 2019-01-19 ENCOUNTER — Encounter: Payer: Self-pay | Admitting: *Deleted

## 2019-01-19 DIAGNOSIS — Z992 Dependence on renal dialysis: Secondary | ICD-10-CM

## 2019-01-19 DIAGNOSIS — N186 End stage renal disease: Secondary | ICD-10-CM

## 2019-01-19 DIAGNOSIS — T82858A Stenosis of vascular prosthetic devices, implants and grafts, initial encounter: Secondary | ICD-10-CM

## 2019-01-19 HISTORY — PX: A/V SHUNT INTERVENTION: CATH118220

## 2019-01-19 LAB — BASIC METABOLIC PANEL
Anion gap: 10 (ref 5–15)
BUN: 44 mg/dL — ABNORMAL HIGH (ref 6–20)
CHLORIDE: 105 mmol/L (ref 98–111)
CO2: 26 mmol/L (ref 22–32)
Calcium: 8.3 mg/dL — ABNORMAL LOW (ref 8.9–10.3)
Creatinine, Ser: 7.51 mg/dL — ABNORMAL HIGH (ref 0.61–1.24)
GFR calc Af Amer: 9 mL/min — ABNORMAL LOW (ref >60)
GFR calc non Af Amer: 8 mL/min — ABNORMAL LOW (ref >60)
Glucose, Bld: 87 mg/dL (ref 70–99)
Potassium: 4.1 mmol/L (ref 3.5–5.1)
SODIUM: 141 mmol/L (ref 135–145)

## 2019-01-19 SURGERY — A/V SHUNT INTERVENTION
Anesthesia: Moderate Sedation | Laterality: Left

## 2019-01-19 MED ORDER — FENTANYL CITRATE (PF) 100 MCG/2ML IJ SOLN
INTRAMUSCULAR | Status: DC | PRN
  Administered 2019-01-19 (×2): 25 ug via INTRAVENOUS
  Administered 2019-01-19: 50 ug via INTRAVENOUS

## 2019-01-19 MED ORDER — FAMOTIDINE 20 MG PO TABS: 40.0000 mg | ORAL_TABLET | Freq: Once | ORAL | Status: DC | PRN

## 2019-01-19 MED ORDER — IOHEXOL 300 MG/ML  SOLN
INTRAMUSCULAR | Status: DC | PRN
  Administered 2019-01-19: 50 mL via INTRAVENOUS

## 2019-01-19 MED ORDER — FENTANYL CITRATE (PF) 100 MCG/2ML IJ SOLN
INTRAMUSCULAR | Status: AC
  Filled 2019-01-19: qty 2

## 2019-01-19 MED ORDER — MIDAZOLAM HCL 2 MG/ML PO SYRP: 8.0000 mg | ORAL_SOLUTION | Freq: Once | ORAL | Status: DC | PRN

## 2019-01-19 MED ORDER — ONDANSETRON HCL 4 MG/2ML IJ SOLN
4.0000 mg | Freq: Four times a day (QID) | INTRAMUSCULAR | Status: DC | PRN
  Filled 2019-01-19: qty 2

## 2019-01-19 MED ORDER — HEPARIN SODIUM (PORCINE) 1000 UNIT/ML IJ SOLN
INTRAMUSCULAR | Status: AC
  Filled 2019-01-19: qty 1

## 2019-01-19 MED ORDER — MIDAZOLAM HCL 5 MG/5ML IJ SOLN
INTRAMUSCULAR | Status: AC
  Filled 2019-01-19: qty 5

## 2019-01-19 MED ORDER — HYDROMORPHONE HCL 1 MG/ML IJ SOLN
1.0000 mg | Freq: Once | INTRAMUSCULAR | Status: DC | PRN
  Filled 2019-01-19: qty 1

## 2019-01-19 MED ORDER — MIDAZOLAM HCL 2 MG/2ML IJ SOLN
INTRAMUSCULAR | Status: DC | PRN
  Administered 2019-01-19: 1 mg via INTRAVENOUS
  Administered 2019-01-19: 2 mg via INTRAVENOUS
  Administered 2019-01-19: 1 mg via INTRAVENOUS

## 2019-01-19 MED ORDER — SODIUM CHLORIDE 0.9 % IV SOLN
INTRAVENOUS | Status: DC
  Administered 2019-01-19: 09:00:00 via INTRAVENOUS

## 2019-01-19 MED ORDER — METHYLPREDNISOLONE SODIUM SUCC 125 MG IJ SOLR
125.0000 mg | Freq: Once | INTRAMUSCULAR | Status: DC | PRN
  Filled 2019-01-19: qty 2

## 2019-01-19 MED ORDER — HEPARIN SODIUM (PORCINE) 1000 UNIT/ML IJ SOLN
INTRAMUSCULAR | Status: DC | PRN
  Administered 2019-01-19: 3000 [IU] via INTRAVENOUS

## 2019-01-19 MED ORDER — DIPHENHYDRAMINE HCL 50 MG/ML IJ SOLN: 50.0000 mg | Freq: Once | INTRAMUSCULAR | Status: DC | PRN

## 2019-01-19 SURGICAL SUPPLY — 17 items
BALLN LUTONIX 7X80X130 (BALLOONS) ×3
BALLOON LUTONIX 7X80X130 (BALLOONS) ×1 IMPLANT
CANNULA 5F STIFF (CANNULA) ×3 IMPLANT
CATH BEACON 5 .035 40 KMP TP (CATHETERS) ×1 IMPLANT
CATH BEACON 5 .038 40 KMP TP (CATHETERS) ×2
DEVICE PRESTO INFLATION (MISCELLANEOUS) ×3 IMPLANT
DRAPE BRACHIAL (DRAPES) ×3 IMPLANT
GLIDEWIRE ADV .035X180CM (WIRE) ×3 IMPLANT
PACK ANGIOGRAPHY (CUSTOM PROCEDURE TRAY) ×3 IMPLANT
SHEATH BRITE TIP 6FRX5.5 (SHEATH) ×3 IMPLANT
SHEATH BRITE TIP 7FRX5.5 (SHEATH) ×3 IMPLANT
STENT VIABAHN 8X50X120 (Permanent Stent) ×3 IMPLANT
STENT VIABAHN 8X7.5X120 (Permanent Stent) ×3 IMPLANT
STENT VIABAHN5X120X8X (Permanent Stent) ×1 IMPLANT
SUT MNCRL AB 4-0 PS2 18 (SUTURE) ×3 IMPLANT
TOWEL OR 17X26 4PK STRL BLUE (TOWEL DISPOSABLE) ×3 IMPLANT
WIRE G 018X200 V18 (WIRE) ×3 IMPLANT

## 2019-01-19 NOTE — Plan of Care (Signed)
D: Patient has been calm and cooperative. Denies SI, HI and AV hallucinations. Received motrin prn for arm pain with relief.  A: Continue to monitor and offer support. R: Safety maintained.

## 2019-01-19 NOTE — Progress Notes (Signed)
[facility] MD Progress Note  01/19/2019 4:54 PM Brett Small  MRN:  [id number] Subjective: Follow-up for this patient with depression.  Patient has no new complaints.  Continues to get dialysis regularly.  Still no lead on any place for him to live Principal Problem: Depression Diagnosis: Principal Problem:   Depression Active Problems:   End stage renal disease ([place])   Hypertension   Homelessness  Total Time spent with patient: 15 minutes  Past Psychiatric History: No prior depression or psychiatric history  Past Medical History:  Past Medical History:  Diagnosis Date  . Hypertension   . Renal disorder     Past Surgical History:  Procedure Laterality Date  . A/V SHUNT INTERVENTION Left 01/19/2019   Procedure: LEFT UPPER EXTREMITY A/V SHUNTOGRAM / UPPER EXTREMITY ANGIOGRAM;  Surgeon: Algernon Huxley, MD;  Location: [facility];  Service: Cardiovascular;  Laterality: Left;  . AORTA - FEMORAL ARTERY BYPASS GRAFT    . AV FISTULA PLACEMENT Left 12/26/2018   Procedure: INSERTION OF GORE STRETCH VASCULAR 4-7MM X  45CM IN LEFT UPPER ARM;  Surgeon: Marty Heck, MD;  Location: [facility] OR;  Service: Vascular;  Laterality: Left;   Family History:  Family History  Problem Relation Age of Onset  . Hypertension Other   . Diabetes Other   . Clotting disorder Father    Family Psychiatric  History: See previous Social History:  Social History   Substance and Sexual Activity  Alcohol Use Not Currently     Social History   Substance and Sexual Activity  Drug Use Never    Social History   Socioeconomic History  . Marital status: Single    Spouse name: Not on file  . Number of children: Not on file  . Years of education: Not on file  . Highest education level: Not on file  Occupational History  . Not on file  Social Needs  . Financial resource strain: Not on file  . Food insecurity:    Worry: Not on file    Inability: Not on file  . Transportation needs:    Medical: Not  on file    Non-medical: Not on file  Tobacco Use  . Smoking status: Former Smoker    Last attempt to quit: 06/26/2018    Years since quitting: 0.5  . Smokeless tobacco: Never Used  . Tobacco comment: smoked for 30 years   Substance and Sexual Activity  . Alcohol use: Not Currently  . Drug use: Never  . Sexual activity: Not on file  Lifestyle  . Physical activity:    Days per week: Not on file    Minutes per session: Not on file  . Stress: Not on file  Relationships  . Social connections:    Talks on phone: Not on file    Gets together: Not on file    Attends religious service: Not on file    Active member of club or organization: Not on file    Attends meetings of clubs or organizations: Not on file    Relationship status: Not on file  Other Topics Concern  . Not on file  Social History Narrative  . Not on file   Additional Social History:                         Sleep: Fair  Appetite:  Fair  Current Medications: Current Facility-Administered Medications  Medication Dose Route Frequency Provider Last Rate Last Dose  .  0.9 %  sodium chloride infusion  100 mL Intravenous PRN Lateef, Munsoor, MD      . 0.9 %  sodium chloride infusion  100 mL Intravenous PRN Lateef, Munsoor, MD      . acetaminophen (TYLENOL) tablet 650 mg  650 mg Oral Q6H PRN Tennis Ship, MD   650 mg at 01/14/19 0219  . alteplase (CATHFLO ACTIVASE) injection 2 mg  2 mg Intracatheter Once PRN Lateef, Munsoor, MD      . amLODipine (NORVASC) tablet 5 mg  5 mg Oral Daily Tennis Ship, MD   5 mg at 01/19/19 1139  . apixaban (ELIQUIS) tablet 2.5 mg  2.5 mg Oral BID Clapacs, Madie Reno, MD   2.5 mg at 01/18/19 1637  . aspirin EC tablet 81 mg  81 mg Oral Daily Tennis Ship, MD   81 mg at 01/19/19 1139  . atorvastatin (LIPITOR) tablet 80 mg  80 mg Oral QHS Tennis Ship, MD   80 mg at 01/18/19 2139  . carvedilol (COREG) tablet 25 mg  25 mg Oral BID WC Tennis Ship, MD   25 mg at 01/19/19 0738  .  Chlorhexidine Gluconate Cloth 2 % PADS 6 each  6 each Topical Q0600 Anthonette Legato, MD   6 each at 01/19/19 0602  . diphenhydrAMINE (BENADRYL) injection 50 mg  50 mg Intravenous Once PRN Stegmayer, Kimberly A, PA-C      . epoetin alfa (EPOGEN,PROCRIT) injection 10,000 Units  10,000 Units Intravenous Q T,Th,Sa-HD Holley Raring, Munsoor, MD   10,000 Units at 01/17/19 1253  . escitalopram (LEXAPRO) tablet 10 mg  10 mg Oral Daily Tennis Ship, MD   10 mg at 01/19/19 1139  . famotidine (PEPCID) tablet 40 mg  40 mg Oral Once PRN Stegmayer, Kimberly A, PA-C      . fentaNYL (SUBLIMAZE) 100 MCG/2ML injection           . gabapentin (NEURONTIN) capsule 100 mg  100 mg Oral TID Clapacs, John T, MD   100 mg at 01/19/19 1139  . heparin 1000 UNIT/ML injection           . heparin injection 1,000 Units  1,000 Units Dialysis PRN Lateef, Munsoor, MD      . heparin injection 2,000 Units  2,000 Units Dialysis PRN Roney Jaffe, MD      . hydrALAZINE (APRESOLINE) tablet 25 mg  25 mg Oral Q8H Tennis Ship, MD   25 mg at 01/19/19 1352  . HYDROmorphone (DILAUDID) injection 1 mg  1 mg Intravenous Once PRN Stegmayer, Kimberly A, PA-C      . hydrOXYzine (ATARAX/VISTARIL) tablet 25 mg  25 mg Oral TID PRN Tennis Ship, MD   25 mg at 01/18/19 2141  . ibuprofen (ADVIL,MOTRIN) tablet 400 mg  400 mg Oral Q6H PRN Clapacs, Madie Reno, MD   400 mg at 01/19/19 1356  . lidocaine (PF) (XYLOCAINE) 1 % injection 5 mL  5 mL Intradermal PRN Lateef, Munsoor, MD      . lidocaine-prilocaine (EMLA) cream 1 application  1 application Topical PRN Lateef, Munsoor, MD      . methylPREDNISolone sodium succinate (SOLU-MEDROL) 125 mg/2 mL injection 125 mg  125 mg Intravenous Once PRN Stegmayer, Kimberly A, PA-C      . midazolam (VERSED) 2 MG/ML syrup 8 mg  8 mg Oral Once PRN Stegmayer, Kimberly A, PA-C      . midazolam (VERSED) 5 MG/5ML injection           . multivitamin (RENA-VIT) tablet 1  tablet  1 tablet Oral Daily Tennis Ship, MD   1 tablet at  01/19/19 1140  . neomycin-bacitracin-polymyxin (NEOSPORIN) ointment   Topical BID Clapacs, John T, MD      . ondansetron [facility]) injection 4 mg  4 mg Intravenous Q6H PRN Stegmayer, Kimberly A, PA-C      . pentafluoroprop-tetrafluoroeth (GEBAUERS) aerosol 1 application  1 application Topical PRN Lateef, Munsoor, MD      . sevelamer carbonate (RENVELA) tablet 1,600 mg  1,600 mg Oral TID WC Tennis Ship, MD   1,600 mg at 01/19/19 1140    Lab Results:  Results for orders placed or performed during the hospital encounter of 01/07/19 (from the past 48 hour(s))  Renal function panel     Status: Abnormal   Collection Time: 01/18/19  6:49 AM  Result Value Ref Range   Sodium 139 135 - 145 mmol/L   Potassium 4.0 3.5 - 5.1 mmol/L   Chloride 101 98 - 111 mmol/L   CO2 27 22 - 32 mmol/L   Glucose, Bld 93 70 - 99 mg/dL   BUN 28 (H) 6 - 20 mg/dL   Creatinine, Ser 5.10 (H) 0.61 - 1.24 mg/dL   Calcium 8.4 (L) 8.9 - 10.3 mg/dL   Phosphorus 2.9 2.5 - 4.6 mg/dL   Albumin 3.6 3.5 - 5.0 g/dL   GFR calc non Af Amer 13 (L) >60 mL/min   GFR calc Af Amer 15 (L) >60 mL/min   Anion gap 11 5 - 15    Comment: Performed at [facility], [address] [place], [place] [address]  CBC     Status: Abnormal   Collection Time: 01/18/19  6:49 AM  Result Value Ref Range   WBC 5.8 4.0 - 10.5 K/uL   RBC 3.04 (L) 4.22 - 5.81 MIL/uL   Hemoglobin 9.2 (L) 13.0 - 17.0 g/dL   HCT 28.8 (L) 39.0 - 52.0 %   MCV 94.7 80.0 - 100.0 fL   MCH 30.3 26.0 - 34.0 pg   MCHC 31.9 30.0 - 36.0 g/dL   RDW 18.8 (H) 11.5 - 15.5 %   Platelets 192 150 - 400 K/uL   nRBC 0.0 0.0 - 0.2 %    Comment: Performed at [facility], [address] [place], [place] [address]  Basic metabolic panel     Status: Abnormal   Collection Time: 01/19/19  6:23 AM  Result Value Ref Range   Sodium 141 135 - 145 mmol/L   Potassium 4.1 3.5 - 5.1 mmol/L   Chloride 105 98 - 111 mmol/L   CO2 26 22 - 32 mmol/L   Glucose, Bld 87 70 - 99  mg/dL   BUN 44 (H) 6 - 20 mg/dL   Creatinine, Ser 7.51 (H) 0.61 - 1.24 mg/dL   Calcium 8.3 (L) 8.9 - 10.3 mg/dL   GFR calc non Af Amer 8 (L) >60 mL/min   GFR calc Af Amer 9 (L) >60 mL/min   Anion gap 10 5 - 15    Comment: Performed at [facility], [address] [place], [place] [address]    Blood Alcohol level:  No results found for: [facility]  Metabolic Disorder Labs: Lab Results  Component Value Date   HGBA1C 5.5 01/08/2019   MPG 111.15 01/08/2019   No results found for: PROLACTIN Lab Results  Component Value Date   CHOL 101 01/08/2019   TRIG 156 (H) 01/08/2019   HDL 33 (L) 01/08/2019   CHOLHDL 3.1 01/08/2019  VLDL 31 01/08/2019   LDLCALC 37 01/08/2019    Physical Findings: AIMS:  , ,  ,  ,    CIWA:    COWS:     Musculoskeletal: Strength & Muscle Tone: within normal limits Gait & Station: normal Patient leans: N/A  Psychiatric Specialty Exam: Physical Exam  Nursing note and vitals reviewed. Constitutional: He appears well-developed and well-nourished.  HENT:  Head: Normocephalic and atraumatic.  Eyes: Pupils are equal, round, and reactive to light. Conjunctivae are normal.  Neck: Normal range of motion.  Cardiovascular: Normal heart sounds.  Respiratory: Effort normal.  GI: Soft.  Musculoskeletal: Normal range of motion.  Neurological: He is alert.  Skin: Skin is warm and dry.  Psychiatric: He has a normal mood and affect. His behavior is normal. Judgment and thought content normal.    Review of Systems  Constitutional: Negative.   HENT: Negative.   Eyes: Negative.   Respiratory: Negative.   Cardiovascular: Negative.   Gastrointestinal: Negative.   Musculoskeletal: Negative.   Skin: Negative.   Neurological: Negative.   Psychiatric/Behavioral: Negative.     Blood pressure 138/68, pulse 72, temperature 99 F (37.2 C), temperature source Oral, resp. rate 14, height 5\' 1"  (1.549 m), weight 49.4 kg, SpO2 96 %.Body mass index is 20.6 kg/m.   General Appearance: Casual  Eye Contact:  Fair  Speech:  Slow  Volume:  Decreased  Mood:  Euthymic  Affect:  Constricted  Thought Process:  Goal Directed  Orientation:  Full (Time, Place, and Person)  Thought Content:  Logical  Suicidal Thoughts:  No  Homicidal Thoughts:  No  Memory:  Immediate;   Fair Recent;   Fair Remote;   Fair  Judgement:  Fair  Insight:  Fair  Psychomotor Activity:  Decreased  Concentration:  Concentration: Fair  Recall:  [employer] of Knowledge:  Fair  Language:  Fair  Akathisia:  No  Handed:  Right  AIMS (if indicated):     Assets:  Desire for Improvement  ADL's:  Intact  Cognition:  WNL  Sleep:  Number of Hours: 6.25     Treatment Plan Summary: Daily contact with patient to assess and evaluate symptoms and progress in treatment, Medication management and Plan No change to psychiatric treatment.  Continue involvement in groups.  Continue working on discharge planning.  No change to medical management.  Appreciate ongoing dialysis.  Alethia Berthold, MD 01/19/2019, 4:54 PM

## 2019-01-19 NOTE — Progress Notes (Signed)
Patient had an angiography done this morning to his L) upper extremity due to malfunctioning of his left brachial artery to axillary vein arteriovenous graft. Post procedure, no edema nor bleeding noted at the site, + thrill and bruit. Slept the remainder of the afternoon but present in the milieu after dinner.

## 2019-01-19 NOTE — H&P (Signed)
[facility] History & Physical Update  The patient was interviewed and re-examined.  The patient's previous History and Physical has been reviewed and is unchanged.  There is no change in the plan of care. We plan to proceed with the scheduled procedure.  Leotis Pain, MD  01/19/2019, 9:17 AM

## 2019-01-19 NOTE — B[facility] Counselor (Signed)
CSW faxed requested information to [facility] for review for bed.  [name], MSW, LCSW 01/19/2019 9:27 AM

## 2019-01-19 NOTE — Progress Notes (Signed)
Recreation Therapy Notes  Date: 01/19/2019  Time: 9:30 am   Location: Craft room   Behavioral response: N/A   Intervention Topic: Problem Solving  Discussion/Intervention: Patient did not attend group.   Clinical Observations/Feedback:  Patient did not attend group.   Ysidro Ramsay LRT/CTRS        Suhas Estis 01/19/2019 10:53 AM

## 2019-01-19 NOTE — B[facility] Counselor (Signed)
CSW followed up with Brett Small at the group home.  Ms. [name] reports that she is declining patient at this time due to concerns patient will not stay at the group home long-tern.  [name], MSW, LCSW 01/19/2019 9:11 AM

## 2019-01-19 NOTE — Tx Team (Signed)
Interdisciplinary Treatment and Diagnostic Plan Update  01/19/2019 Time of Session: 8:30am  Brett Small MRN: [id number]  Principal Diagnosis: Depression  Secondary Diagnoses: Principal Problem:   Depression Active Problems:   End stage renal disease ([place])   Hypertension   Homelessness   Current Medications:  Current Facility-Administered Medications  Medication Dose Route Frequency Provider Last Rate Last Dose  . fentaNYL (SUBLIMAZE) 100 MCG/2ML injection           . heparin 1000 UNIT/ML injection           . midazolam (VERSED) 5 MG/5ML injection           . 0.9 %  sodium chloride infusion  100 mL Intravenous PRN Lateef, Munsoor, MD      . 0.9 %  sodium chloride infusion  100 mL Intravenous PRN Lateef, Munsoor, MD      . 0.9 %  sodium chloride infusion   Intravenous Continuous Stegmayer, Kimberly A, PA-C 10 mL/hr at 01/19/19 0857    . acetaminophen (TYLENOL) tablet 650 mg  650 mg Oral Q6H PRN Tennis Ship, MD   650 mg at 01/14/19 0219  . alteplase (CATHFLO ACTIVASE) injection 2 mg  2 mg Intracatheter Once PRN Lateef, Munsoor, MD      . amLODipine (NORVASC) tablet 5 mg  5 mg Oral Daily Tennis Ship, MD   5 mg at 01/18/19 0814  . apixaban (ELIQUIS) tablet 2.5 mg  2.5 mg Oral BID Clapacs, Madie Reno, MD   2.5 mg at 01/18/19 1637  . aspirin EC tablet 81 mg  81 mg Oral Daily Tennis Ship, MD   81 mg at 01/18/19 0814  . atorvastatin (LIPITOR) tablet 80 mg  80 mg Oral QHS Tennis Ship, MD   80 mg at 01/18/19 2139  . carvedilol (COREG) tablet 25 mg  25 mg Oral BID WC Tennis Ship, MD   25 mg at 01/19/19 0738  . ceFAZolin (ANCEF) IVPB 1 g/50 mL premix  1 g Intravenous Once Stegmayer, Kimberly A, PA-C 100 mL/hr at 01/19/19 0904 1 g at 01/19/19 0904  . Chlorhexidine Gluconate Cloth 2 % PADS 6 each  6 each Topical Q0600 Anthonette Legato, MD   6 each at 01/19/19 0602  . diphenhydrAMINE (BENADRYL) injection 50 mg  50 mg Intravenous Once PRN Stegmayer, Kimberly A, PA-C      . epoetin alfa  (EPOGEN,PROCRIT) injection 10,000 Units  10,000 Units Intravenous Q T,Th,Sa-HD Holley Raring, Munsoor, MD   10,000 Units at 01/17/19 1253  . escitalopram (LEXAPRO) tablet 10 mg  10 mg Oral Daily Tennis Ship, MD   10 mg at 01/18/19 0814  . famotidine (PEPCID) tablet 40 mg  40 mg Oral Once PRN Stegmayer, Joelene Millin A, PA-C      . fentaNYL (SUBLIMAZE) injection    PRN Algernon Huxley, MD   50 mcg at 01/19/19 0911  . gabapentin (NEURONTIN) capsule 100 mg  100 mg Oral TID Clapacs, John T, MD   100 mg at 01/18/19 1637  . heparin injection 1,000 Units  1,000 Units Dialysis PRN Holley Raring, Munsoor, MD      . heparin injection 2,000 Units  2,000 Units Dialysis PRN Roney Jaffe, MD      . hydrALAZINE (APRESOLINE) tablet 25 mg  25 mg Oral Q8H Tennis Ship, MD   Stopped at 01/19/19 0602  . HYDROmorphone (DILAUDID) injection 1 mg  1 mg Intravenous Once PRN Stegmayer, Kimberly A, PA-C      . hydrOXYzine (ATARAX/VISTARIL) tablet 25 mg  25  mg Oral TID PRN Tennis Ship, MD   25 mg at 01/18/19 2141  . ibuprofen (ADVIL,MOTRIN) tablet 400 mg  400 mg Oral Q6H PRN Clapacs, Madie Reno, MD   400 mg at 01/18/19 2140  . lidocaine (PF) (XYLOCAINE) 1 % injection 5 mL  5 mL Intradermal PRN Lateef, Munsoor, MD      . lidocaine-prilocaine (EMLA) cream 1 application  1 application Topical PRN Lateef, Munsoor, MD      . methylPREDNISolone sodium succinate (SOLU-MEDROL) 125 mg/2 mL injection 125 mg  125 mg Intravenous Once PRN Stegmayer, Kimberly A, PA-C      . midazolam (VERSED) 2 MG/ML syrup 8 mg  8 mg Oral Once PRN Stegmayer, Kimberly A, PA-C      . midazolam (VERSED) injection    PRN Algernon Huxley, MD   2 mg at 01/19/19 0911  . multivitamin (RENA-VIT) tablet 1 tablet  1 tablet Oral Daily Tennis Ship, MD   1 tablet at 01/18/19 0820  . neomycin-bacitracin-polymyxin (NEOSPORIN) ointment   Topical BID Clapacs, John T, MD      . ondansetron (ZOFRAN) injection 4 mg  4 mg Intravenous Q6H PRN Stegmayer, Kimberly A, PA-C      .  pentafluoroprop-tetrafluoroeth (GEBAUERS) aerosol 1 application  1 application Topical PRN Lateef, Munsoor, MD      . sevelamer carbonate (RENVELA) tablet 1,600 mg  1,600 mg Oral TID WC Tennis Ship, MD   1,600 mg at 01/18/19 1636   PTA Medications: Medications Prior to Admission  Medication Sig Dispense Refill Last Dose  . acetaminophen (TYLENOL) 325 MG tablet Take 650 mg by mouth every 6 (six) hours as needed for mild pain or headache.   Past Week at Unknown time  . amLODipine (NORVASC) 5 MG tablet Take 5 mg by mouth daily. hold if Systolic reading is <295   01/07/2019 at Unknown time  . apixaban (ELIQUIS) 2.5 MG TABS tablet Take 1 tablet (2.5 mg total) by mouth 2 (two) times daily. 60 tablet 0 01/07/2019 at Unknown time  . aspirin EC 81 MG tablet Take 81 mg by mouth daily.   01/07/2019 at Unknown time  . atorvastatin (LIPITOR) 80 MG tablet Take 80 mg by mouth at bedtime.   01/06/2019 at Unknown time  . B Complex-C-Folic Acid (NEPHRO-VITE PO) Take 1 tablet by mouth daily.   01/07/2019 at Unknown time  . carvedilol (COREG) 25 MG tablet Take 25 mg by mouth 2 (two) times daily with a meal.   01/07/2019 at Unknown time  . escitalopram (LEXAPRO) 10 MG tablet Take 1 tablet (10 mg total) by mouth daily. 30 tablet 1 01/07/2019 at Unknown time  . gabapentin (NEURONTIN) 300 MG capsule Take 300 mg by mouth 3 (three) times daily.   01/07/2019 at Unknown time  . hydrALAZINE (APRESOLINE) 25 MG tablet Take 25 mg by mouth every 8 (eight) hours.   01/07/2019 at Unknown time  . hydrOXYzine (ATARAX/VISTARIL) 25 MG tablet Take 25 mg by mouth 3 (three) times daily as needed for anxiety.   Past Week at Unknown time  . sevelamer carbonate (RENVELA) 800 MG tablet Take 2 tablets (1,600 mg total) by mouth 3 (three) times daily with meals. 180 tablet 0 01/07/2019 at Unknown time    Patient Stressors: Health problems Medication change or noncompliance  Patient Strengths: Ability for insight Communication skills  Treatment  Modalities: Medication Management, Group therapy, Case management,  1 to 1 session with clinician, Psychoeducation, Recreational therapy.   Physician Treatment Plan for  Primary Diagnosis: Depression Long Term Goal(s): Improvement in symptoms so as ready for discharge Improvement in symptoms so as ready for discharge   Short Term Goals: Ability to verbalize feelings will improve Ability to disclose and discuss suicidal ideas Compliance with prescribed medications will improve  Medication Management: Evaluate patient's response, side effects, and tolerance of medication regimen.  Therapeutic Interventions: 1 to 1 sessions, Unit Group sessions and Medication administration.  Evaluation of Outcomes: Progressing  Physician Treatment Plan for Secondary Diagnosis: Principal Problem:   Depression Active Problems:   End stage renal disease ([place])   Hypertension   Homelessness  Long Term Goal(s): Improvement in symptoms so as ready for discharge Improvement in symptoms so as ready for discharge   Short Term Goals: Ability to verbalize feelings will improve Ability to disclose and discuss suicidal ideas Compliance with prescribed medications will improve     Medication Management: Evaluate patient's response, side effects, and tolerance of medication regimen.  Therapeutic Interventions: 1 to 1 sessions, Unit Group sessions and Medication administration.  Evaluation of Outcomes: Progressing   RN Treatment Plan for Primary Diagnosis: Depression Long Term Goal(s): Knowledge of disease and therapeutic regimen to maintain health will improve  Short Term Goals: Ability to demonstrate self-control, Ability to participate in decision making will improve, Ability to verbalize feelings will improve and Ability to disclose and discuss suicidal ideas  Medication Management: RN will administer medications as ordered by provider, will assess and evaluate patient's response and provide education to  patient for prescribed medication. RN will report any adverse and/or side effects to prescribing provider.  Therapeutic Interventions: 1 on 1 counseling sessions, Psychoeducation, Medication administration, Evaluate responses to treatment, Monitor vital signs and CBGs as ordered, Perform/monitor CIWA, COWS, AIMS and Fall Risk screenings as ordered, Perform wound care treatments as ordered.  Evaluation of Outcomes: Progressing   LCSW Treatment Plan for Primary Diagnosis: Depression Long Term Goal(s): Safe transition to appropriate next level of care at discharge, Engage patient in therapeutic group addressing interpersonal concerns.  Short Term Goals: Engage patient in aftercare planning with referrals and resources, Increase social support, Increase ability to appropriately verbalize feelings, Increase emotional regulation and Facilitate acceptance of mental health diagnosis and concerns  Therapeutic Interventions: Assess for all discharge needs, 1 to 1 time with Social worker, Explore available resources and support systems, Assess for adequacy in community support network, Educate family and significant other(s) on suicide prevention, Complete Psychosocial Assessment, Interpersonal group therapy.  Evaluation of Outcomes: Progressing   Progress in Treatment: Attending groups: Yes. Participating in groups: Yes. Taking medication as prescribed: Yes. Toleration medication: Yes. Family/Significant other contact made: Yes, individual(s) contacted:  SPE completed with the patient's father.  Patient understands diagnosis: Yes. Discussing patient identified problems/goals with staff: Yes. Medical problems stabilized or resolved: Yes. Denies suicidal/homicidal ideation: Yes. Issues/concerns per patient self-inventory: No. Other: none  New problem(s) identified: No, Describe:  none  New Short Term/Long Term Goal(s): elimination of symptoms of psychosis, medication management for mood  stabilization; elimination of SI thoughts; development of comprehensive mental wellness  Patient Goals: "if I had a place to live I'd be ok"  Discharge Plan or Barriers: Pt is currently homeless.  CSW confirmed with the patient's father that at this time the patient is not allowed to come to his home.  Patient was originally reporting plans to go to [place] or [place], however, has changed his mind again, he is reporting that he is open to a group home.  Currently referred to  two group homes, one of which has declined stating belief that patient would not remain long term.    Reason for Continuation of Hospitalization: Anxiety Depression Medical Issues Medication stabilization Suicidal ideation  Estimated Length of Stay: 1-5 days  Recreational Therapy: Patient Stressors: N/A Patient Goal: Patient will engage in groups without prompting or encouragement from LRT x3 group sessions within 5 recreation therapy group sessions  Attendees: Patient: Brett Small 01/19/2019 9:11 AM  Physician: Dr. Weber Cooks, MD 01/19/2019 9:11 AM  Nursing:  01/19/2019 9:11 AM  RN Care Manager: 01/19/2019 9:11 AM  Social Worker: [name], LCSW 01/19/2019 9:11 AM  Recreational Therapist: 01/19/2019 9:11 AM  Other:  01/19/2019 9:11 AM  Other:  01/19/2019 9:11 AM  Other: 01/19/2019 9:11 AM    Scribe for Treatment Team: Rozann Lesches, LCSW 01/19/2019 9:11 AM

## 2019-01-19 NOTE — Plan of Care (Signed)
Patient is alert and oriented. Complained of L) arm pain yesterday but stated today, "It don't hurt like it did yesterday." Some swelling to that extremity noted. Patient compliant with treatment plan. Reports that he slept fair with the help of a sleep aid. Appetite is good with normal energy level. Rates his depression, hopelessness and anxiety. His goal for today is to make arrangements for his discharge. Milieu remains safe with q 15 minute safety checks. Will continue to monitor.

## 2019-01-19 NOTE — Progress Notes (Signed)
D: Patient has been calm and cooperative. Denies SI, HI and AV hallucinations. Received motrin prn for arm pain with relief.  A: Continue to monitor and offer support. R: Safety maintained.

## 2019-01-19 NOTE — Op Note (Signed)
[facility]    OPERATIVE NOTE   PROCEDURE: 1.  Left arteriovenous graft cannulation under ultrasound guidance in both an antegrade and retrograde fashion 2.  Left arm shuntogram and left upper extremity angiogram including catheter placement into the left brachial artery through the retrograde sheath 3.  Percutaneous transluminal angioplasty of the left axillary vein and venous anastomosis with 7 mm diameter by 8 cm length Lutonix drug-coated angioplasty balloon 4.  Viabahn stent placement x2 with 8 mm diameter by 7.5 cm length stent and an 8 mm diameter by 5 cm length stent for high-grade residual stenosis and extravasation after angioplasty  PRE-OPERATIVE DIAGNOSIS: 1. ESRD 2. Malfunctioning left brachial artery to axillary vein arteriovenous graft  POST-OPERATIVE DIAGNOSIS: same as above   SURGEON: Leotis Pain, MD  ANESTHESIA: local with MCS  ESTIMATED BLOOD LOSS: 15 cc  FINDING(S): 1. Left upper extremity angiogram demonstrated patent brachial artery and ulnar artery with a patent palmar arch in the hand filling the hand.  The radial artery was small and appeared to be occluded. 2. Graft had a high-grade stenosis of about 80% at the venous anastomosis and in the axillary vein several centimeters beyond the venous anastomosis.  The vessel normalized in the subclavian vein and the remainder of the central venous circulation was patent.  SPECIMEN(S):  None  CONTRAST: 50 cc  FLUORO TIME: 5.2 minutes  MODERATE CONSCIOUS SEDATION TIME:  Approximately 30 minutes using 4 mg of Versed and 100 Mcg of Fentanyl  INDICATIONS: Brett Small is a 43 y.o. male who presents with malfunctioning left brachial artery to axillary vein arteriovenous graft.  The patient is scheduled for left arm shuntogram.  He has been complaining of some hand pain so evaluation of his left upper extremity arterial circulation is planned as well.  The patient is aware the risks include but  are not limited to: bleeding, infection, thrombosis of the cannulated access, and possible anaphylactic reaction to the contrast.  The patient is aware of the risks of the procedure and elects to proceed forward.  DESCRIPTION: After full informed written consent was obtained, the patient was brought back to the angiography suite and placed supine upon the angiography table.  The patient was connected to monitoring equipment. Moderate conscious sedation was administered during a face to face encounter throughout the procedure with my supervision of the RN administering medicines and monitoring the patient's vital signs, pulse oximetry, telemetry and mental status throughout from the start of the procedure until the patient was taken to the recovery room The left arm was prepped and draped in the standard fashion for a percutaneous access intervention.  Under ultrasound guidance, the left brachial artery to axillary vein arteriovenous graft was cannulated with a micropuncture needle in a retrograde fashion under direct ultrasound guidance were it was patent and a permanent image was performed.  The microwire was advanced into the graft and the needle was exchanged for the a microsheath.  I then upsized to a 6 Fr Sheath put a catheter in the brachial artery proximal to the AV graft anastomosis and imaging was performed.  Hand injections were completed to image the access including the central venous system through the sheath and hand injections were performed of the left upper extremity arterial circuit with the catheter parked in the brachial artery. Left upper extremity angiogram demonstrated patent brachial artery and ulnar artery with a patent palmar arch in the hand filling the hand.  The radial artery was small and appeared to  be occluded.Graft had a high-grade stenosis of about 80% at the venous anastomosis and in the axillary vein several centimeters beyond the venous anastomosis.  The vessel normalized in  the subclavian vein and the remainder of the central venous circulation was patent.  Based on the images, this patient will need intervention to the venous anastomotic stenosis.  The ulnar artery appeared to provide adequate flow to the hand even with the injection proximal to the graft so that if steal was present it was fairly mild.  I elected not to treat the radial artery today but would consider that on another day if possible. I then gave the patient 3000 units of intravenous heparin.  The retrograde sheath was removed.  I then accessed the graft under direct ultrasound guidance in an antegrade fashion to address the venous anastomotic stenosis.  A permanent image was recorded and we upsized to a 6 [place] sheath.  I then crossed the stenosis with an advantage wire.  Based on the imaging, a 7 mm x 8 cm Lutonix drug-coated angioplasty balloon was selected.  The balloon was centered around the stenosis and inflated to 12 ATM for 1 minute(s).  On completion imaging, a greater than 50 % residual stenosis was present well with some extravasation from the axillary vein I then upsized to a 7 [place] sheath and exchanged for a 0.018 wire.  An 8 mm diameter by 7.5 cm length via bond stent was used to cover the area of extravasation and high-grade residual stenosis.  This came up just short of the venous anastomosis and a second via bond stent was deployed.  This was an 8 mm diameter by 5 cm length Viabahn stent with generous overlap.  These were postdilated with a 7 mm balloon with excellent angiographic completion result and less than 15% residual stenosis and no extravasation.     Based on the completion imaging, no further intervention is necessary.  The wire and balloon were removed from the sheath.  A 4-0 Monocryl purse-string suture was sewn around the sheath.  The sheath was removed while tying down the suture.  A sterile bandage was applied to the puncture site.  COMPLICATIONS: None  CONDITION:  Stable   Leotis Pain  01/19/2019 9:57 AM    This note was created with Dragon Medical transcription system. Any errors in dictation are purely unintentional.

## 2019-01-19 NOTE — [facility] Group Notes (Signed)
LCSW Group Therapy Note   01/19/2019 2:01 PM   Type of Therapy and Topic:  Group Therapy:  Overcoming Obstacles   Participation Level:  Did Not Attend   Description of Group:    In this group patients will be encouraged to explore what they see as obstacles to their own wellness and recovery. They will be guided to discuss their thoughts, feelings, and behaviors related to these obstacles. The group will process together ways to cope with barriers, with attention given to specific choices patients can make. Each patient will be challenged to identify changes they are motivated to make in order to overcome their obstacles. This group will be process-oriented, with patients participating in exploration of their own experiences as well as giving and receiving support and challenge from other group members.   Therapeutic Goals: 1. Patient will identify personal and current obstacles as they relate to admission. 2. Patient will identify barriers that currently interfere with their wellness or overcoming obstacles.  3. Patient will identify feelings, thought process and behaviors related to these barriers. 4. Patient will identify two changes they are willing to make to overcome these obstacles:      Summary of Patient Progress x     Therapeutic Modalities:   Cognitive Behavioral Therapy Solution Focused Therapy Motivational Interviewing Relapse Prevention Therapy  Evalina Field, MSW, LCSW Clinical Social Work 01/19/2019 2:01 PM

## 2019-01-20 MED ORDER — APIXABAN 2.5 MG PO TABS
2.5000 mg | ORAL_TABLET | Freq: Two times a day (BID) | ORAL | Status: DC
  Administered 2019-01-20 – 2019-01-26 (×13): 2.5 mg via ORAL
  Filled 2019-01-20 (×14): qty 1

## 2019-01-20 MED ORDER — GABAPENTIN 100 MG PO CAPS
100.0000 mg | ORAL_CAPSULE | Freq: Three times a day (TID) | ORAL | Status: DC
  Administered 2019-01-20 – 2019-01-26 (×19): 100 mg via ORAL
  Filled 2019-01-20 (×18): qty 1

## 2019-01-20 NOTE — Progress Notes (Signed)
[facility] MD Progress Note  01/20/2019 3:24 PM Brett Small  MRN:  [id number] Subjective: Follow-up for this homeless man on dialysis who continues to be on the ward.  Patient has no new complaints.  He did have a procedure done yesterday on his shunt.  Has a little pain from it today.  Otherwise no new complaints.  No mood complaints.  No suicidal ideation no evidence of psychosis. Principal Problem: Depression Diagnosis: Principal Problem:   Depression Active Problems:   End stage renal disease ([name])   Hypertension   Homelessness  Total Time spent with patient: 30 minutes  Past Psychiatric History: Past history unremarkable  Past Medical History:  Past Medical History:  Diagnosis Date  . Hypertension   . Renal disorder     Past Surgical History:  Procedure Laterality Date  . A/V SHUNT INTERVENTION Left 01/19/2019   Procedure: LEFT UPPER EXTREMITY A/V SHUNTOGRAM / UPPER EXTREMITY ANGIOGRAM;  Surgeon: Algernon Huxley, MD;  Location: [place] [facility];  Service: Cardiovascular;  Laterality: Left;  . AORTA - FEMORAL ARTERY BYPASS GRAFT    . AV FISTULA PLACEMENT Left 12/26/2018   Procedure: INSERTION OF GORE STRETCH VASCULAR 4-7MM X  45CM IN LEFT UPPER ARM;  Surgeon: Marty Heck, MD;  Location: [facility];  Service: Vascular;  Laterality: Left;   Family History:  Family History  Problem Relation Age of Onset  . Hypertension Other   . Diabetes Other   . Clotting disorder Father    Family Psychiatric  History: None Social History:  Social History   Substance and Sexual Activity  Alcohol Use Not Currently     Social History   Substance and Sexual Activity  Drug Use Never    Social History   Socioeconomic History  . Marital status: Single    Spouse name: Not on file  . Number of children: Not on file  . Years of education: Not on file  . Highest education level: Not on file  Occupational History  . Not on file  Social Needs  . Financial resource strain: Not on file   . Food insecurity:    Worry: Not on file    Inability: Not on file  . Transportation needs:    Medical: Not on file    Non-medical: Not on file  Tobacco Use  . Smoking status: Former Smoker    Last attempt to quit: 06/26/2018    Years since quitting: 0.5  . Smokeless tobacco: Never Used  . Tobacco comment: smoked for 30 years   Substance and Sexual Activity  . Alcohol use: Not Currently  . Drug use: Never  . Sexual activity: Not on file  Lifestyle  . Physical activity:    Days per week: Not on file    Minutes per session: Not on file  . Stress: Not on file  Relationships  . Social connections:    Talks on phone: Not on file    Gets together: Not on file    Attends religious service: Not on file    Active member of club or organization: Not on file    Attends meetings of clubs or organizations: Not on file    Relationship status: Not on file  Other Topics Concern  . Not on file  Social History Narrative  . Not on file   Additional Social History:                         Sleep:  Fair  Appetite:  Fair  Current Medications: Current Facility-Administered Medications  Medication Dose Route Frequency Provider Last Rate Last Dose  . 0.9 %  sodium chloride infusion  100 mL Intravenous PRN Lateef, Munsoor, MD      . 0.9 %  sodium chloride infusion  100 mL Intravenous PRN Lateef, Munsoor, MD      . acetaminophen (TYLENOL) tablet 650 mg  650 mg Oral Q6H PRN Tennis Ship, MD   650 mg at 01/14/19 0219  . alteplase (CATHFLO ACTIVASE) injection 2 mg  2 mg Intracatheter Once PRN Lateef, Munsoor, MD      . amLODipine (NORVASC) tablet 5 mg  5 mg Oral Daily Tennis Ship, MD   5 mg at 01/20/19 1448  . apixaban (ELIQUIS) tablet 2.5 mg  2.5 mg Oral BID Clapacs, John T, MD      . aspirin EC tablet 81 mg  81 mg Oral Daily Tennis Ship, MD   81 mg at 01/20/19 1449  . atorvastatin (LIPITOR) tablet 80 mg  80 mg Oral QHS Tennis Ship, MD   80 mg at 01/19/19 2204  . carvedilol  (COREG) tablet 25 mg  25 mg Oral BID WC Tennis Ship, MD   25 mg at 01/19/19 1656  . Chlorhexidine Gluconate Cloth 2 % PADS 6 each  6 each Topical Q0600 Anthonette Legato, MD   6 each at 01/19/19 0602  . diphenhydrAMINE (BENADRYL) injection 50 mg  50 mg Intravenous Once PRN Stegmayer, Kimberly A, PA-C      . epoetin alfa (EPOGEN,PROCRIT) injection 10,000 Units  10,000 Units Intravenous Q T,Th,Sa-HD Lateef, Munsoor, MD   10,000 Units at 01/20/19 1400  . escitalopram (LEXAPRO) tablet 10 mg  10 mg Oral Daily Tennis Ship, MD   10 mg at 01/20/19 1450  . famotidine (PEPCID) tablet 40 mg  40 mg Oral Once PRN Stegmayer, Kimberly A, PA-C      . gabapentin (NEURONTIN) capsule 100 mg  100 mg Oral TID Clapacs, John T, MD      . heparin injection 1,000 Units  1,000 Units Dialysis PRN Holley Raring, Munsoor, MD      . heparin injection 2,000 Units  2,000 Units Dialysis PRN Roney Jaffe, MD      . hydrALAZINE (APRESOLINE) tablet 25 mg  25 mg Oral Q8H Tennis Ship, MD   25 mg at 01/20/19 1452  . HYDROmorphone (DILAUDID) injection 1 mg  1 mg Intravenous Once PRN Stegmayer, Kimberly A, PA-C      . hydrOXYzine (ATARAX/VISTARIL) tablet 25 mg  25 mg Oral TID PRN Tennis Ship, MD   25 mg at 01/20/19 0829  . ibuprofen (ADVIL,MOTRIN) tablet 400 mg  400 mg Oral Q6H PRN Clapacs, Madie Reno, MD   400 mg at 01/20/19 1455  . lidocaine (PF) (XYLOCAINE) 1 % injection 5 mL  5 mL Intradermal PRN Lateef, Munsoor, MD      . lidocaine-prilocaine (EMLA) cream 1 application  1 application Topical PRN Lateef, Munsoor, MD      . methylPREDNISolone sodium succinate (SOLU-MEDROL) 125 mg/2 mL injection 125 mg  125 mg Intravenous Once PRN Stegmayer, Kimberly A, PA-C      . midazolam (VERSED) 2 MG/ML syrup 8 mg  8 mg Oral Once PRN Stegmayer, Kimberly A, PA-C      . multivitamin (RENA-VIT) tablet 1 tablet  1 tablet Oral Daily Tennis Ship, MD   1 tablet at 01/20/19 1453  . neomycin-bacitracin-polymyxin (NEOSPORIN) ointment   Topical BID  Clapacs, Madie Reno, MD      .  ondansetron (ZOFRAN) injection 4 mg  4 mg Intravenous Q6H PRN Stegmayer, Kimberly A, PA-C      . pentafluoroprop-tetrafluoroeth (GEBAUERS) aerosol 1 application  1 application Topical PRN Lateef, Munsoor, MD      . sevelamer carbonate (RENVELA) tablet 1,600 mg  1,600 mg Oral TID WC Tennis Ship, MD   1,600 mg at 01/19/19 1656    Lab Results:  Results for orders placed or performed during the hospital encounter of 01/07/19 (from the past 48 hour(s))  Basic metabolic panel     Status: Abnormal   Collection Time: 01/19/19  6:23 AM  Result Value Ref Range   Sodium 141 135 - 145 mmol/L   Potassium 4.1 3.5 - 5.1 mmol/L   Chloride 105 98 - 111 mmol/L   CO2 26 22 - 32 mmol/L   Glucose, Bld 87 70 - 99 mg/dL   BUN 44 (H) 6 - 20 mg/dL   Creatinine, Ser 7.51 (H) 0.61 - 1.24 mg/dL   Calcium 8.3 (L) 8.9 - 10.3 mg/dL   GFR calc non Af Amer 8 (L) >60 mL/min   GFR calc Af Amer 9 (L) >60 mL/min   Anion gap 10 5 - 15    Comment: Performed at [facility], [address] [place], [place] [address]    Blood Alcohol level:  No results found for: [facility]  Metabolic Disorder Labs: Lab Results  Component Value Date   HGBA1C 5.5 01/08/2019   MPG 111.15 01/08/2019   No results found for: PROLACTIN Lab Results  Component Value Date   CHOL 101 01/08/2019   TRIG 156 (H) 01/08/2019   HDL 33 (L) 01/08/2019   CHOLHDL 3.1 01/08/2019   VLDL 31 01/08/2019   LDLCALC 37 01/08/2019    Physical Findings: AIMS:  , ,  ,  ,    CIWA:    COWS:     Musculoskeletal: Strength & Muscle Tone: within normal limits Gait & Station: normal Patient leans: N/A  Psychiatric Specialty Exam: Physical Exam  Nursing note and vitals reviewed. Constitutional: He appears well-developed and well-nourished.  HENT:  Head: Normocephalic and atraumatic.  Eyes: Pupils are equal, round, and reactive to light. Conjunctivae are normal.  Neck: Normal range of motion.  Cardiovascular: Normal  heart sounds.  Respiratory: Effort normal.  GI: Soft.  Musculoskeletal: Normal range of motion.  Neurological: He is alert.  Skin: Skin is warm and dry.     Psychiatric: He has a normal mood and affect. His speech is normal and behavior is normal. Judgment and thought content normal. Cognition and memory are normal.    Review of Systems  Constitutional: Negative.   HENT: Negative.   Eyes: Negative.   Respiratory: Negative.   Cardiovascular: Negative.   Gastrointestinal: Negative.   Musculoskeletal: Negative.   Skin: Negative.   Neurological: Negative.   Psychiatric/Behavioral: Negative.     Blood pressure (!) 153/91, pulse 92, temperature 98.6 F (37 C), temperature source Oral, resp. rate 14, height 5\' 1"  (1.549 m), weight 49.4 kg, SpO2 100 %.Body mass index is 20.58 kg/m.  General Appearance: Casual  Eye Contact:  Good  Speech:  Clear and Coherent  Volume:  Decreased  Mood:  Euthymic  Affect:  Congruent  Thought Process:  Coherent  Orientation:  Full (Time, Place, and Person)  Thought Content:  Logical  Suicidal Thoughts:  No  Homicidal Thoughts:  No  Memory:  Immediate;   Fair Recent;   Fair Remote;   Fair  Judgement:  Fair  Insight:  Fair  Psychomotor Activity:  Decreased  Concentration:  Concentration: Fair  Recall:  [employer] of Knowledge:  Fair  Language:  Fair  Akathisia:  No  Handed:  Right  AIMS (if indicated):     Assets:  Desire for Improvement  ADL's:  Intact  Cognition:  WNL  Sleep:  Number of Hours: 6     Treatment Plan Summary: Daily contact with patient to assess and evaluate symptoms and progress in treatment, Medication management and Plan Patient with no acute psychiatric complaints.  No behavior problems.  No suicidal ideation no evidence of psychosis.  Patient is homeless without any leads so far on any place for him to go.  Coronavirus situation greatly complicating all of this.  Social work and treatment team continuing to try and get  him some placement while continuing to monitor to make sure his mood is stable.  Alethia Berthold, MD 01/20/2019, 3:24 PM

## 2019-01-20 NOTE — Plan of Care (Signed)
Patient stated to this writer that he is feeling better and had a good day.   Problem: Education: Goal: Emotional status will improve Outcome: Progressing Goal: Mental status will improve Outcome: Progressing

## 2019-01-20 NOTE — Progress Notes (Signed)
Recreation Therapy Notes     Date: 01/20/2019  Time: 9:30 am  Location: Craft Room  Behavioral response: Appropriate  Intervention Topic: Communication  Discussion/Intervention:  Group content today was focused on communication. The group defined communication and ways to communicate with others. Individuals stated reason why communication is important and some reasons to communicate with others. Patients expressed if they thought they were good at communicating with others and ways they could improve their communication skills. The group identified important parts of communication and some experiences they have had in the past with communication. The group participated in the intervention "What is that?", where they had a chance to test out their communication skills and identify ways to improve their communication techniques.  Clinical Observations/Feedback:  Patient came to group and was focused on what peers and staff had to say about communication. Individual was social with peers and staff while participating in group.  Aquinnah Devin LRT/CTRS         Delrae Hagey 01/20/2019 10:45 AM

## 2019-01-20 NOTE — Progress Notes (Signed)
Pre HD Assessment   01/20/19 1030  Neurological  Level of Consciousness Alert  Orientation Level Oriented X4  Respiratory  Respiratory Pattern Regular  Chest Assessment Chest expansion symmetrical  Bilateral Breath Sounds Clear  Cough Non-productive;Dry (dry cough)  Cardiac  Pulse Regular  Heart Sounds S1, S2  ECG Monitor Yes  Cardiac Rhythm NSR  Vascular  R Radial Pulse +2  L Radial Pulse +2  Edema Left upper extremity  LUE Edema +1  Additional Vascular Comments  (generalized edema d/t fluid accumulation,+1 L arm d/t surgry)  Integumentary  Integumentary (WDL) X  Skin Color Appropriate for ethnicity  Skin Condition Dry  Musculoskeletal  Musculoskeletal (WDL) WDL  Gastrointestinal  Bowel Sounds Assessment Active  GU Assessment  Genitourinary (WDL) X  Genitourinary Symptoms Oliguria (HD pt. )  Psychosocial  Psychosocial (WDL) X  Patient Behaviors Cooperative;Calm  Needs Expressed Physical  Emotional support given Given to patient

## 2019-01-20 NOTE — Progress Notes (Signed)
D - Patient was in the day room upon arrival to the unit. Patient was pleasant during assessment and medication administration. Patient denies SI/HI/AVH, depression and pain. Patient stated he had some anxiety because he is still trying to find placement but thinks his "EX" might take him back or possible his dad.   A - Patient was compliant with medication administration per MD orders and procedures on the unit. Patient given education. Patient given support and encouragement to be active in his treatment plan. Patient informed to let staff know if there are any issues or problems on the unit.   R - Patient being monitored Q 15 minutes for safety per unit protocol. Patient remains safe on the unit.

## 2019-01-20 NOTE — Progress Notes (Deleted)
Pre HD Tx   01/20/19 1030  Hand-Off documentation  Report given to (Full Name) Trellis Paganini RN  Report received from (Full Name) Herbert Deaner RN  Vital Signs  Temp 99.5 F (37.5 C)  Temp Source Oral  Pulse Rate 98  Pulse Rate Source Monitor  Resp 15  BP (!) 148/87  BP Location Left Arm  BP Method Automatic  Patient Position (if appropriate) Sitting  Oxygen Therapy  SpO2 94 %  O2 Device Room Air  Pain Assessment  Pain Scale 0-10  Pain Score 7  Pain Type Surgical pain;Neuropathic pain (from angiogram yesterday )  Pain Location Arm  Pain Orientation Left  Patients Stated Pain Goal 0 ("0-3" stated by pt )  Dialysis Weight  Weight 49.4 kg  Type of Weight Pre-Dialysis  Time-Out for Hemodialysis  What Procedure? Hemodialysis   Pt Identifiers(min of two) First/Last Name;MRN/Account#  Correct Site? Yes  Correct Side? Yes  Correct Procedure? Yes  Consents Verified? Yes  Rad Studies Available? N/A  Safety Precautions Reviewed? Yes  Engineer, civil (consulting) Number 4  Station Number 4  UF/Alarm Test Passed  Conductivity: Meter 14  Conductivity: Machine  14  pH 7.4  Reverse Osmosis Main  Normal Saline Lot Number [id number]  Dialyzer Lot Number [id number]  Disposable Set Lot Number [id number]  Machine Temperature 98.6 F (37 C)  Musician and Audible Yes  Blood Lines Intact and Secured Yes  Pre Treatment Patient Checks  Vascular access used during treatment Catheter  Patient is receiving dialysis in a chair Yes  Hepatitis B Surface Antigen Results Negative  Date Hepatitis B Surface Antigen Drawn 12/25/18  Hepatitis B Surface Antibody  (<10)  Date Hepatitis B Surface Antibody Drawn 12/25/18  Hemodialysis Consent Verified Yes  Hemodialysis Standing Orders Initiated Yes  ECG (Telemetry) Monitor On Yes  Prime Ordered Normal Saline  Length of  DialysisTreatment -hour(s) 3.5 Hour(s)  Dialysis Treatment Comments Na 140  Dialyzer Elisio 17H NR  Dialysate 3K, 2.5 Ca   Dialysate Flow Ordered 600  Blood Flow Rate Ordered 400 mL/min  Ultrafiltration Goal 3 Liters  Dialysis Blood Pressure Support Ordered Normal Saline  Education / Care Plan  Dialysis Education Provided Yes  Documented Education in Care Plan Yes  Fistula / Graft Left Upper arm Arteriovenous vein graft  Placement Date/Time: 12/26/18 1024   Placed prior to admission: No  Orientation: Left  Access Location: Upper arm  Access Type: (c) Arteriovenous vein graft  Site Condition No complications (some edema and pain, see pain scale. (from angiogram 3/30))  Fistula / Graft Assessment Present;Thrill;Bruit  Drainage Description None  Hemodialysis Catheter Right Subclavian  No Placement Date or Time found.   Placed prior to admission: Yes  Orientation: Right  Access Location: Subclavian  Site Condition No complications  Blue Lumen Status Capped (Central line)  Red Lumen Status Capped (Central line)  Dressing Type Biopatch  Dressing Status Clean;Dry;Intact;Dressing changed;Antimicrobial disc changed  Interventions New dressing;Dressing changed  Drainage Description None  Dressing Change Due 01/27/19

## 2019-01-20 NOTE — Progress Notes (Signed)
[facility]  ROUNDING NOTE   Subjective:   Seen and examined on hemodialysis. Seated in chair.   Appears to be volume overload. Some SOB and wheezing earlier today Does not know his fry weight   HEMODIALYSIS FLOWSHEET:  Blood Flow Rate (mL/min): 400 mL/min Arterial Pressure (mmHg): -170 mmHg Venous Pressure (mmHg): 120 mmHg Transmembrane Pressure (mmHg): 60 mmHg Ultrafiltration Rate (mL/min): 860 mL/min Dialysate Flow Rate (mL/min): 600 ml/min Conductivity: Machine : 13.8 Conductivity: Machine : 13.8 Dialysis Fluid Bolus: Normal Saline Bolus Amount (mL): 250 mL     Objective:  Vital signs in last 24 hours:  Temp:  [98.1 F (36.7 C)-99.5 F (37.5 C)] (P) 99.5 F (37.5 C) (03/31 1030) Pulse Rate:  [72-104] (P) 98 (03/31 1030) Resp:  [14-18] (P) 15 (03/31 1030) BP: (124-143)/(68-91) (P) 148/87 (03/31 1030) SpO2:  [91 %-98 %] (P) 94 % (03/31 1030)  Weight change:  Filed Weights   01/18/19 0612 01/19/19 0820 01/19/19 0852  Weight: 48.5 kg 49.4 kg 49.4 kg    Intake/Output: No intake/output data recorded.   Intake/Output this shift:  No intake/output data recorded.  Physical Exam: General: No acute distress  Head: Normocephalic, atraumatic. Moist oral mucosal membranes  Eyes: Anicteric  Neck: Supple, trachea midline  Lungs:  Clear to auscultation, normal effort  Heart: regular  Abdomen:  Soft, nontender, bowel sounds present  Extremities: No peripheral edema.  Neurologic: Awake, alert, oriented  Skin: No lesions  Psych Depressed affect  Access: Left internal jugular PermCath, left arm AVG +thrill and +bruit    Basic Metabolic Panel: Recent Labs  Lab 01/14/19 0651 01/16/19 0701 01/18/19 0649 01/19/19 0623  NA 136 136 139 141  K 4.5 4.6 4.0 4.1  CL 101 100 101 105  CO2 26 25 27 26   GLUCOSE 93 113* 93 87  BUN 37* 34* 28* 44*  CREATININE 5.56* 5.13* 5.10* 7.51*  CALCIUM 8.6* 8.4* 8.4* 8.3*  PHOS 2.4* 2.7 2.9  --     Liver Function  Tests: Recent Labs  Lab 01/14/19 0651 01/16/19 0701 01/18/19 0649  ALBUMIN 3.6 3.5 3.6   No results for input(s): LIPASE, AMYLASE in the last 168 hours. No results for input(s): AMMONIA in the last 168 hours.  CBC: Recent Labs  Lab 01/14/19 0651 01/16/19 0701 01/18/19 0649  WBC 7.0 5.8 5.8  HGB 8.3* 8.2* 9.2*  HCT 25.6* 25.5* 28.8*  MCV 90.1 92.4 94.7  PLT 199 190 192    Cardiac Enzymes: No results for input(s): CKTOTAL, CKMB, CKMBINDEX, TROPONINI in the last 168 hours.  BNP: Invalid input(s): POCBNP  CBG: No results for input(s): GLUCAP in the last 168 hours.  Microbiology: Results for orders placed or performed during the hospital encounter of 12/24/18  MRSA PCR Screening     Status: None   Collection Time: 12/27/18  1:20 PM  Result Value Ref Range Status   MRSA by PCR NEGATIVE NEGATIVE Final    Comment:        The GeneXpert MRSA Assay (FDA approved for NASAL specimens only), is one component of a comprehensive MRSA colonization surveillance program. It is not intended to diagnose MRSA infection nor to guide or monitor treatment for MRSA infections. Performed at [facility], [place] [address], [place], [place] [address]     Coagulation Studies: No results for input(s): LABPROT, INR in the last 72 hours.  Urinalysis: No results for input(s): COLORURINE, LABSPEC, PHURINE, GLUCOSEU, HGBUR, BILIRUBINUR, KETONESUR, PROTEINUR, UROBILINOGEN, NITRITE, LEUKOCYTESUR in the last  72 hours.  Invalid input(s): APPERANCEUR    Imaging: No results found.   Medications:   . sodium chloride    . sodium chloride     . amLODipine  5 mg Oral Daily  . apixaban  2.5 mg Oral BID  . aspirin EC  81 mg Oral Daily  . atorvastatin  80 mg Oral QHS  . carvedilol  25 mg Oral BID WC  . Chlorhexidine Gluconate Cloth  6 each Topical Q0600  . epoetin (EPOGEN/PROCRIT) injection  10,000 Units Intravenous Q T,Th,Sa-HD  . escitalopram  10 mg Oral Daily  . gabapentin  100  mg Oral TID  . hydrALAZINE  25 mg Oral Q8H  . multivitamin  1 tablet Oral Daily  . neomycin-bacitracin-polymyxin   Topical BID  . sevelamer carbonate  1,600 mg Oral TID WC   sodium chloride, sodium chloride, acetaminophen, alteplase, diphenhydrAMINE, famotidine, heparin, heparin, HYDROmorphone (DILAUDID) injection, hydrOXYzine, ibuprofen, lidocaine (PF), lidocaine-prilocaine, methylPREDNISolone (SOLU-MEDROL) injection, midazolam, ondansetron (ZOFRAN) IV, pentafluoroprop-tetrafluoroeth  Assessment/ Plan:  [name] is a 43 y.o. white male with end stage renal disease on hemodialysis, hypertension, depression, aortobifemoral bypass, who was admitted to [facility] on 01/07/2019 for evaluation and treatment of major depressive disorder.  Patient is homeless - No dialysis unit assigned  1.  ESRD: seen and examined on hemodialysis treatment. Tolerating treatment well.   2.  Anemia chronic kidney disease:   - EPO with HD treatment  3.  Secondary hyperparathyroidism with hyperphosphatemia:  - Sevelamer with meals - patient not eating well due to restrictions; will change to regular diet with fluid restriction  4.  Hypertension.   Current regimen of amlodipine, carvedilol, hydralazine.    5. Dialysis access device: maturing left AVG placed on 3/6 by Dr. Carlis Abbott. Now with some numbness - underwent angiogram on 3/30 with angioplasty and placement of stent by Dr Lucky Cowboy   LOS: [place] 3/31/202011:13 AM

## 2019-01-20 NOTE — Progress Notes (Signed)
Post HD Assessment  Removed 1.7kg during dialysis. Pt experienced intermittent cramping r/t fluid removal. Ultrafiltration hourly rate reduced, and unable to obtain original 3kg fluid removal goal as a result. Suspect fluid in non-vascular areas and need to allow for vascular refill in order to access and remove during next HD. Noted generalized edema still present.  Encouraging pt to limit fluid intake & elevate swollen left extremity (d/t angiogram yesterday). RN notified of pain associated with the procedure, and reports they will give medication upon transfer back to [facility] unit.     01/20/19 1406  Neurological  Level of Consciousness Alert  Orientation Level Oriented X4  Respiratory  Respiratory Pattern Regular  Chest Assessment Chest expansion symmetrical  Bilateral Breath Sounds Clear  Cardiac  Pulse Regular  Heart Sounds S1, S2  ECG Monitor Yes  Cardiac Rhythm NSR  Vascular  R Radial Pulse +2  L Radial Pulse +2  Edema Left upper extremity  LUE Edema +1  Integumentary  Integumentary (WDL) X  Skin Color Appropriate for ethnicity  Skin Condition Dry  Musculoskeletal  Musculoskeletal (WDL) WDL  Gastrointestinal  Bowel Sounds Assessment Active  GU Assessment  Genitourinary (WDL) X  Genitourinary Symptoms Oliguria (HD pt. )  Psychosocial  Psychosocial (WDL) X  Patient Behaviors Cooperative;Calm  Needs Expressed Physical  Emotional support given Given to patient

## 2019-01-20 NOTE — [facility] Group Notes (Signed)

## 2019-01-20 NOTE — Progress Notes (Signed)
Post HD Tx   01/20/19 1415  Vital Signs  Pulse Rate 92  Resp 14  BP (!) 153/91  Post-Hemodialysis Assessment  Rinseback Volume (mL) 250 mL  KECN 73.5 V  Dialyzer Clearance Lightly streaked  Duration of HD Treatment -hour(s) 3.5 hour(s)  Hemodialysis Intake (mL) 500 mL  UF Total -Machine (mL) 2300 mL  Net UF (mL) 1800 mL  Tolerated HD Treatment Yes

## 2019-01-20 NOTE — Progress Notes (Signed)
HD Tx End   01/20/19 1412  Hand-Off documentation  Report given to (Full Name) Collier Bullock RN  Report received from (Full Name) Trellis Paganini RN  Vital Signs  Temp 98.6 F (37 C)  Temp Source Oral  Pulse Rate 92  Pulse Rate Source Monitor  Resp 14  BP (!) 152/86  BP Location Right Arm  BP Method Automatic  Patient Position (if appropriate) Sitting  Oxygen Therapy  SpO2 100 %  O2 Device Nasal Cannula  O2 Flow Rate (L/min) 2 L/min  Pain Assessment  Pain Scale 0-10  Pain Score 7  Pain Type Surgical pain;Neuropathic pain  Pain Location Arm  Pain Orientation Left  Pain Intervention(s) Medication (See eMAR);RN made aware [facility] RN to give pt medication upon return to unit)  Dialysis Weight  Weight  (BHU scale - need to obtain using same scale post HD)  During Hemodialysis Assessment  Blood Flow Rate (mL/min) 200 mL/min  Arterial Pressure (mmHg) -100 mmHg  Venous Pressure (mmHg) 90 mmHg  Transmembrane Pressure (mmHg) 20 mmHg  Ultrafiltration Rate (mL/min) 490 mL/min  Dialysate Flow Rate (mL/min) 600 ml/min  Conductivity: Machine  14  HD Safety Checks Performed Yes  Dialysis Fluid Bolus Normal Saline  Bolus Amount (mL) 250 mL  Intra-Hemodialysis Comments Tx completed  Hemodialysis Catheter Right Subclavian  No Placement Date or Time found.   Placed prior to admission: Yes  Orientation: Right  Access Location: Subclavian  Site Condition No complications  Blue Lumen Status Flushed;Saline locked;Capped (Central line);Heparin locked  Red Lumen Status Flushed;Saline locked;Capped (Central line);Heparin locked  Purple Lumen Status N/A  Catheter fill solution Heparin 1000 units/ml  Catheter fill volume (Arterial) 1.5 cc  Catheter fill volume (Venous) 1.5  Dressing Type Biopatch  Dressing Status Clean;Dry;Intact  Interventions New dressing  Drainage Description None  Post treatment catheter status Capped and Clamped

## 2019-01-20 NOTE — Progress Notes (Signed)
HD Tx Start   01/20/19 1035  Vital Signs  Pulse Rate 99  Resp 16  BP (!) 149/86  Oxygen Therapy  SpO2 97 %  O2 Device Nasal Cannula  O2 Flow Rate (L/min) 2 L/min  During Hemodialysis Assessment  Blood Flow Rate (mL/min) 400 mL/min  Arterial Pressure (mmHg) -140 mmHg  Venous Pressure (mmHg) 160 mmHg  Transmembrane Pressure (mmHg) 40 mmHg  Ultrafiltration Rate (mL/min) 1050 mL/min (10102mL removed PER HOUR)  Dialysate Flow Rate (mL/min) 600 ml/min  Conductivity: Machine  14  HD Safety Checks Performed Yes  Dialysis Fluid Bolus Normal Saline  Bolus Amount (mL) 250 mL  Intra-Hemodialysis Comments Tx initiated  Hemodialysis Catheter Right Subclavian  No Placement Date or Time found.   Placed prior to admission: Yes  Orientation: Right  Access Location: Subclavian  Site Condition No complications  Blue Lumen Status Infusing  Red Lumen Status Infusing  Dressing Type Biopatch  Dressing Status Clean;Dry;Intact  Interventions New dressing  Drainage Description None  Dressing Change Due 01/27/19

## 2019-01-20 NOTE — Plan of Care (Signed)
Pt denies SI, HI and not a threat to others. Anxiety is an "5" , a prn given. Depression is a "0". His goal is to do dialysis today and to speak to his friends. Collier Bullock RN Problem: Education: Goal: Freight forwarder Education information/materials will improve Outcome: Progressing Goal: Emotional status will improve Outcome: Progressing Goal: Mental status will improve Outcome: Progressing Goal: Verbalization of understanding the information provided will improve Outcome: Progressing   Problem: Education: Goal: Emotional status will improve Outcome: Progressing   Problem: Education: Goal: Mental status will improve Outcome: Progressing   Problem: Education: Goal: Verbalization of understanding the information provided will improve Outcome: Progressing   Problem: Safety: Goal: Periods of time without injury will increase Outcome: Progressing   Problem: Education: Goal: Utilization of techniques to improve thought processes will improve Outcome: Progressing Goal: Knowledge of the prescribed therapeutic regimen will improve Outcome: Progressing

## 2019-01-20 NOTE — Progress Notes (Signed)
Pre HD Tx   01/20/19 1030  Hand-Off documentation  Report given to (Full Name) Trellis Paganini RN  Report received from (Full Name) Collier Bullock RN  Vital Signs  Temp 99.5 F (37.5 C)  Temp Source Oral  Pulse Rate 98  Pulse Rate Source Monitor  Resp 15  BP (!) 148/87  BP Location Left Arm  BP Method Automatic  Patient Position (if appropriate) Sitting  Oxygen Therapy  SpO2 94 %  O2 Device Room Air  Pain Assessment  Pain Scale 0-10  Pain Score 7  Pain Type Surgical pain;Neuropathic pain (from angiogram yesterday )  Pain Location Arm  Pain Orientation Left  Patients Stated Pain Goal 0 ("0-3" stated by pt )  Dialysis Weight  Weight 49.4 kg  Type of Weight Pre-Dialysis  Time-Out for Hemodialysis  What Procedure? Hemodialysis   Pt Identifiers(min of two) First/Last Name;MRN/Account#  Correct Site? Yes  Correct Side? Yes  Correct Procedure? Yes  Consents Verified? Yes  Rad Studies Available? N/A  Safety Precautions Reviewed? Yes  Engineer, civil (consulting) Number 4  Station Number 4  UF/Alarm Test Passed  Conductivity: Meter 14  Conductivity: Machine  14  pH 7.4  Reverse Osmosis Main  Normal Saline Lot Number [id number]  Dialyzer Lot Number [id number]  Disposable Set Lot Number [id number]  Machine Temperature 98.6 F (37 C)  Musician and Audible Yes  Blood Lines Intact and Secured Yes  Pre Treatment Patient Checks  Vascular access used during treatment Catheter  Patient is receiving dialysis in a chair Yes  Hepatitis B Surface Antigen Results Negative  Date Hepatitis B Surface Antigen Drawn 12/25/18  Hepatitis B Surface Antibody  (<10)  Date Hepatitis B Surface Antibody Drawn 12/25/18  Hemodialysis Consent Verified Yes  Hemodialysis Standing Orders Initiated Yes  ECG (Telemetry) Monitor On Yes  Prime Ordered Normal Saline  Length of  DialysisTreatment -hour(s) 3.5 Hour(s)  Dialysis Treatment Comments Na 140  Dialyzer Elisio 17H NR  Dialysate 3K, 2.5 Ca   Dialysate Flow Ordered 600  Blood Flow Rate Ordered 400 mL/min  Ultrafiltration Goal 3 Liters  Dialysis Blood Pressure Support Ordered Normal Saline  Education / Care Plan  Dialysis Education Provided Yes  Documented Education in Care Plan Yes  Fistula / Graft Left Upper arm Arteriovenous vein graft  Placement Date/Time: 12/26/18 1024   Placed prior to admission: No  Orientation: Left  Access Location: Upper arm  Access Type: (c) Arteriovenous vein graft  Site Condition No complications (some edema and pain, see pain scale. (from angiogram 3/30))  Fistula / Graft Assessment Present;Thrill;Bruit  Drainage Description None  Hemodialysis Catheter Right Subclavian  No Placement Date or Time found.   Placed prior to admission: Yes  Orientation: Right  Access Location: Subclavian  Site Condition No complications  Blue Lumen Status Capped (Central line)  Red Lumen Status Capped (Central line)  Dressing Type Biopatch  Dressing Status Clean;Dry;Intact;Dressing changed;Antimicrobial disc changed  Interventions New dressing;Dressing changed  Drainage Description None  Dressing Change Due 01/27/19

## 2019-01-20 NOTE — BHH Counselor (Signed)
CSW spoke with the [facility] regarding referrals.  CSW was informed that at this time the patient can't go as the state of [place] has advised all group homes to no longer accept individuals for the nest 30 days.  [name], MSW, LCSW 01/20/2019 10:41 AM

## 2019-01-20 NOTE — Progress Notes (Signed)
Patient alert and oriented x 4, denies SI/HI/AVH, he endorsed pain in his left upper arm, S/P angiography d/t pain/ blood clot in the arm. Patient noted able to move extremity, wiggle fingers, about  +1 edema noted to the arm, ulnar pulse and radial pulse + 2 Moderate and palpable, patient fistula remains intact, writer felt for thrill and listened for bruit, it was intact. Patient was medicated earlier in the shift when he complained of pain in the left extremity. Patient receptive to staff, 15 minutes safety checks maintained will continue to monitor .

## 2019-01-21 NOTE — Progress Notes (Signed)
Recreation Therapy Notes  Date: 01/21/2019  Time: 9:30 am  Location: Craft Room  Behavioral response: Appropriate  Intervention Topic: Coping skills  Discussion/Intervention:  Group content on today was focused on coping skills. The group defined what coping skills are and when they can be used. Individuals described how they normally cope with thing and the coping skills they normally use. Patients expressed why it is important to cope with things and how not coping with things can affect you. The group participated in the intervention "Exploring coping skills" where they had a chance to test new coping skills they could use in the future.  Clinical Observations/Feedback:  Patient came to group and was focused on what peers and staff had to say about coping skills. Individual participated in the intervention and was social with peers and staff. Brailey Buescher LRT/CTRS           Muaad Boehning 01/21/2019 10:48 AM

## 2019-01-21 NOTE — Progress Notes (Signed)
D: Pt during assessments denies SI/HI/AVH, contracts for safety. Pt is pleasant and cooperative. Pt. Endorses a normal mood. Pt. Reports discharge ready.   A: Q x 15 minute observation checks were completed for safety. Patient was provided with education. Patient was given/offered medications per orders. Patient  was encourage to attend groups, participate in unit activities and continue with plan of care. Pt. Chart and plans of care reviewed. Pt. Given support and encouragement.   R: Patient is complaint with medications and unit procedures. Pt. Goes to group and snack time, eating good. Pt. Pain being managed utilizing MD orders. Pt. Dialysis fistula WDL. Pt. Ankle bandage clean, dry, and intact.             Precautionary checks every 15 minutes for safety maintained, room free of safety hazards, patient sustains no injury or falls during this shift. Will endorse care to next shift.

## 2019-01-21 NOTE — Plan of Care (Signed)
Pt. Endorses a normal mood. Pt. Reports doing better since admissions. Pt. Denies si/hi/avh, can contract for safety.    Problem: Education: Goal: Emotional status will improve Outcome: Progressing Goal: Mental status will improve Outcome: Progressing   Problem: Safety: Goal: Periods of time without injury will increase Outcome: Progressing

## 2019-01-21 NOTE — BHH Group Notes (Signed)
Emotional Regulation 01/21/2019 1PM  Type of Therapy/Topic:  Group Therapy:  Emotion Regulation  Participation Level:  Active   Description of Group:   The purpose of this group is to assist patients in learning to regulate negative emotions and experience positive emotions. Patients will be guided to discuss ways in which they have been vulnerable to their negative emotions. These vulnerabilities will be juxtaposed with experiences of positive emotions or situations, and patients will be challenged to use positive emotions to combat negative ones. Special emphasis will be placed on coping with negative emotions in conflict situations, and patients will process healthy conflict resolution skills.  Therapeutic Goals: 1. Patient will identify two positive emotions or experiences to reflect on in order to balance out negative emotions 2. Patient will label two or more emotions that they find the most difficult to experience 3. Patient will demonstrate positive conflict resolution skills through discussion and/or role plays  Summary of Patient Progress:   Actively and appropriately engaged in the group. Patient was able to provide support and validation to other group members.Patient practiced active listening when interacting with the facilitator and other group members. 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     Therapeutic Modalities:   Cognitive Behavioral Therapy Feelings Identification Dialectical Behavioral Therapy   Yvette Rack, LCSW 01/21/2019 2:00 PM

## 2019-01-21 NOTE — Plan of Care (Addendum)
Pt denies SI, HI, and hallucinations. Pt reported sleeping well "untill 4am". Depression is 0/10 and anxiety 0/10. His goal is "find a way out of here ". He has made contact with his ex wife and asked to live with her in [place].  Donie Lemelin RN  Problem: Education: Goal: Knowledge of [place] Education information/materials will improve Outcome: Progressing Goal: Emotional status will improve Outcome: Progressing Goal: Mental status will improve Outcome: Progressing Goal: Verbalization of understanding the information provided will improve Outcome: Progressing   Problem: Safety: Goal: Periods of time without injury will increase Outcome: Progressing Problem: Education: Goal: Utilization of techniques to improve thought processes will improve Outcome: Progressing Goal: Knowledge of the prescribed therapeutic regimen will improve Outcome: Progressing   Problem: Coping: Goal: Coping ability will improve Outcome: Progressing Goal: Will verbalize feelings Outcome: Progressing   Problem: Acute Pain Goal: Pain control Description Patient will demonstrate personal actions to control pain.   Outcome: Progressing

## 2019-01-21 NOTE — Progress Notes (Signed)
[facility] MD Progress Note  01/21/2019 4:13 PM Brett Small  MRN:  [id number] Subjective: Follow-up for this patient with situational depression.  Patient has no new complaints today.  Mood is stable.  No evidence of psychosis.  Denies suicidal or homicidal ideation.  Taking care of his ADLs adequately.  Cooperative with medical and psychiatric treatment.  Patient had expressed willingness to go to [place] to stay with his ex-wife.  I was given a phone number for her and asked to call her to confirm this.  I called the phone number this afternoon and it was the correct number but it went to a voicemail with a "mailbox full" message so I was" able to leave a message.  We will keep trying to get through. Principal Problem: Depression Diagnosis: Principal Problem:   Depression Active Problems:   End stage renal disease ([place])   Hypertension   Homelessness  Total Time spent with patient: 20 minutes  Past Psychiatric History: Patient has a history of chronic medical problems.  Past Medical History:  Past Medical History:  Diagnosis Date  . Hypertension   . Renal disorder     Past Surgical History:  Procedure Laterality Date  . A/V SHUNT INTERVENTION Left 01/19/2019   Procedure: LEFT UPPER EXTREMITY A/V SHUNTOGRAM / UPPER EXTREMITY ANGIOGRAM;  Surgeon: Algernon Huxley, MD;  Location: [place] [facility];  Service: Cardiovascular;  Laterality: Left;  . AORTA - FEMORAL ARTERY BYPASS GRAFT    . AV FISTULA PLACEMENT Left 12/26/2018   Procedure: INSERTION OF GORE STRETCH VASCULAR 4-7MM X  45CM IN LEFT UPPER ARM;  Surgeon: Marty Heck, MD;  Location: [facility] OR;  Service: Vascular;  Laterality: Left;   Family History:  Family History  Problem Relation Age of Onset  . Hypertension Other   . Diabetes Other   . Clotting disorder Father    Family Psychiatric  History: None Social History:  Social History   Substance and Sexual Activity  Alcohol Use Not Currently     Social History    Substance and Sexual Activity  Drug Use Never    Social History   Socioeconomic History  . Marital status: Single    Spouse name: Not on file  . Number of children: Not on file  . Years of education: Not on file  . Highest education level: Not on file  Occupational History  . Not on file  Social Needs  . Financial resource strain: Not on file  . Food insecurity:    Worry: Not on file    Inability: Not on file  . Transportation needs:    Medical: Not on file    Non-medical: Not on file  Tobacco Use  . Smoking status: Former Smoker    Last attempt to quit: 06/26/2018    Years since quitting: 0.5  . Smokeless tobacco: Never Used  . Tobacco comment: smoked for 30 years   Substance and Sexual Activity  . Alcohol use: Not Currently  . Drug use: Never  . Sexual activity: Not on file  Lifestyle  . Physical activity:    Days per week: Not on file    Minutes per session: Not on file  . Stress: Not on file  Relationships  . Social connections:    Talks on phone: Not on file    Gets together: Not on file    Attends religious service: Not on file    Active member of club or organization: Not on file  Attends meetings of clubs or organizations: Not on file    Relationship status: Not on file  Other Topics Concern  . Not on file  Social History Narrative  . Not on file   Additional Social History:                         Sleep: Fair  Appetite:  Fair  Current Medications: Current Facility-Administered Medications  Medication Dose Route Frequency Provider Last Rate Last Dose  . 0.9 %  sodium chloride infusion  100 mL Intravenous PRN Lateef, Munsoor, MD      . 0.9 %  sodium chloride infusion  100 mL Intravenous PRN Lateef, Munsoor, MD      . acetaminophen (TYLENOL) tablet 650 mg  650 mg Oral Q6H PRN Tennis Ship, MD   650 mg at 01/14/19 0219  . alteplase (CATHFLO ACTIVASE) injection 2 mg  2 mg Intracatheter Once PRN Lateef, Munsoor, MD      . amLODipine  (NORVASC) tablet 5 mg  5 mg Oral Daily Tennis Ship, MD   5 mg at 01/21/19 0816  . apixaban (ELIQUIS) tablet 2.5 mg  2.5 mg Oral BID Amanat Hackel, Madie Reno, MD   2.5 mg at 01/21/19 0818  . aspirin EC tablet 81 mg  81 mg Oral Daily Tennis Ship, MD   81 mg at 01/21/19 0816  . atorvastatin (LIPITOR) tablet 80 mg  80 mg Oral QHS Tennis Ship, MD   80 mg at 01/20/19 2151  . carvedilol (COREG) tablet 25 mg  25 mg Oral BID WC Tennis Ship, MD   25 mg at 01/21/19 0815  . Chlorhexidine Gluconate Cloth 2 % PADS 6 each  6 each Topical Q0600 Anthonette Legato, MD   6 each at 01/19/19 0602  . diphenhydrAMINE (BENADRYL) injection 50 mg  50 mg Intravenous Once PRN Stegmayer, Kimberly A, PA-C      . epoetin alfa (EPOGEN,PROCRIT) injection 10,000 Units  10,000 Units Intravenous Q T,Th,Sa-HD Lateef, Munsoor, MD   10,000 Units at 01/20/19 1400  . escitalopram (LEXAPRO) tablet 10 mg  10 mg Oral Daily Tennis Ship, MD   10 mg at 01/21/19 0814  . famotidine (PEPCID) tablet 40 mg  40 mg Oral Once PRN Stegmayer, Kimberly A, PA-C      . gabapentin (NEURONTIN) capsule 100 mg  100 mg Oral TID Trevyn Lumpkin T, MD   100 mg at 01/21/19 1144  . heparin injection 1,000 Units  1,000 Units Dialysis PRN Lateef, Munsoor, MD      . heparin injection 2,000 Units  2,000 Units Dialysis PRN Roney Jaffe, MD      . hydrALAZINE (APRESOLINE) tablet 25 mg  25 mg Oral Q8H Tennis Ship, MD   25 mg at 01/21/19 0618  . HYDROmorphone (DILAUDID) injection 1 mg  1 mg Intravenous Once PRN Stegmayer, Kimberly A, PA-C      . hydrOXYzine (ATARAX/VISTARIL) tablet 25 mg  25 mg Oral TID PRN Tennis Ship, MD   25 mg at 01/20/19 2151  . ibuprofen (ADVIL,MOTRIN) tablet 400 mg  400 mg Oral Q6H PRN Trey Gulbranson, Madie Reno, MD   400 mg at 01/21/19 2542  . lidocaine (PF) (XYLOCAINE) 1 % injection 5 mL  5 mL Intradermal PRN Lateef, Munsoor, MD      . lidocaine-prilocaine (EMLA) cream 1 application  1 application Topical PRN Lateef, Munsoor, MD      .  methylPREDNISolone sodium succinate (SOLU-MEDROL) 125 mg/2 mL injection 125 mg  125 mg Intravenous Once PRN Stegmayer, Joelene Millin A, PA-C      . midazolam (VERSED) 2 MG/ML syrup 8 mg  8 mg Oral Once PRN Stegmayer, Janalyn Harder, PA-C      . multivitamin (RENA-VIT) tablet 1 tablet  1 tablet Oral Daily Tennis Ship, MD   1 tablet at 01/21/19 0817  . neomycin-bacitracin-polymyxin (NEOSPORIN) ointment   Topical BID Tej Murdaugh T, MD      . ondansetron (ZOFRAN) injection 4 mg  4 mg Intravenous Q6H PRN Stegmayer, Kimberly A, PA-C      . pentafluoroprop-tetrafluoroeth (GEBAUERS) aerosol 1 application  1 application Topical PRN Lateef, Munsoor, MD      . sevelamer carbonate (RENVELA) tablet 1,600 mg  1,600 mg Oral TID WC Tennis Ship, MD   1,600 mg at 01/21/19 1145    Lab Results: No results found for this or any previous visit (from the past 48 hour(s)).  Blood Alcohol level:  No results found for: [facility]  Metabolic Disorder Labs: Lab Results  Component Value Date   HGBA1C 5.5 01/08/2019   MPG 111.15 01/08/2019   No results found for: PROLACTIN Lab Results  Component Value Date   CHOL 101 01/08/2019   TRIG 156 (H) 01/08/2019   HDL 33 (L) 01/08/2019   CHOLHDL 3.1 01/08/2019   VLDL 31 01/08/2019   LDLCALC 37 01/08/2019    Physical Findings: AIMS:  , ,  ,  ,    CIWA:    COWS:     Musculoskeletal: Strength & Muscle Tone: within normal limits Gait & Station: normal Patient leans: N/A  Psychiatric Specialty Exam: Physical Exam  Nursing note and vitals reviewed. Constitutional: He appears well-developed and well-nourished.  HENT:  Head: Normocephalic and atraumatic.  Eyes: Pupils are equal, round, and reactive to light. Conjunctivae are normal.  Neck: Normal range of motion.  Cardiovascular: Normal heart sounds.  Respiratory: Effort normal.  GI: Soft.  Musculoskeletal: Normal range of motion.  Neurological: He is alert.  Skin: Skin is warm and dry.  Psychiatric: He has a normal  mood and affect. His speech is normal and behavior is normal. Judgment and thought content normal. Cognition and memory are normal.    Review of Systems  Constitutional: Negative.   HENT: Negative.   Eyes: Negative.   Respiratory: Negative.   Cardiovascular: Negative.   Gastrointestinal: Negative.   Musculoskeletal: Negative.   Skin: Negative.   Neurological: Negative.   Psychiatric/Behavioral: Negative.     Blood pressure (!) 142/82, pulse (!) 103, temperature 98 F (36.7 C), temperature source Oral, resp. rate 18, height 5\' 1"  (1.549 m), weight 49.4 kg, SpO2 95 %.Body mass index is 20.58 kg/m.  General Appearance: Negative  Eye Contact:  Fair  Speech:  Clear and Coherent  Volume:  Normal  Mood:  Euthymic  Affect:  Congruent  Thought Process:  Goal Directed  Orientation:  Full (Time, Place, and Person)  Thought Content:  Logical  Suicidal Thoughts:  No  Homicidal Thoughts:  No  Memory:  Immediate;   Fair Recent;   Fair Remote;   Fair  Judgement:  Fair  Insight:  Fair  Psychomotor Activity:  Normal  Concentration:  Concentration: Fair  Recall:  [employer] of Knowledge:  Fair  Language:  Fair  Akathisia:  No  Handed:  Right  AIMS (if indicated):     Assets:  Desire for Improvement  ADL's:  Intact  Cognition:  WNL  Sleep:  Number of Hours: 6.5  Treatment Plan Summary: Daily contact with patient to assess and evaluate symptoms and progress in treatment, Medication management and Plan Stable with no psychiatric symptoms.  Still unable to discharge patient because of his medical needs combined with his homelessness and lack of resources.  We will keep trying to get through to his ex-wife in [place].  Brett Mory, MD 01/21/2019, 4:13 PM

## 2019-01-21 NOTE — BHH Group Notes (Signed)
[facility] Group Notes:  (Nursing/MHT/Case Management/Adjunct)  Date:  01/21/2019  Time:  11:50 PM  Type of Therapy:  Group Therapy  Participation Level:  Did Not Attend  Summary of Progress/Problems:  Brett Small 01/21/2019, 11:50 PM

## 2019-01-22 NOTE — Progress Notes (Signed)
HD Tx Start   01/22/19 0910  Hand-Off documentation  Report given to (Full Name) Trellis Paganini RN  Report received from (Full Name) Tyler Pita RN [facility]  Vital Signs  Pulse Rate 85  Pulse Rate Source Monitor  Resp 17  BP 136/80  BP Location Right Arm  BP Method Automatic  Patient Position (if appropriate) Sitting  Oxygen Therapy  SpO2 98 %  O2 Device Room Air  Pain Assessment  Pain Scale 0-10  Pain Score 7  Pain Type Acute pain;Surgical pain  Pain Location Arm  Pain Orientation Left  Pain Radiating Towards  (towards fingertips / numbness/tingling in hand)  Pain Descriptors / Indicators Heaviness;Pressure;Throbbing;Tingling  Pain Frequency Constant  Pain Intervention(s) RN made aware;Repositioned;Emotional support;Rest  Dialysis Weight  Weight 49.4 kg  Type of Weight Pre-Dialysis  During Hemodialysis Assessment  Blood Flow Rate (mL/min) 400 mL/min  Arterial Pressure (mmHg) -180 mmHg  Venous Pressure (mmHg) 160 mmHg  Transmembrane Pressure (mmHg) 40 mmHg  Ultrafiltration Rate (mL/min) 540 mL/min  Dialysate Flow Rate (mL/min) 600 ml/min  Conductivity: Machine  14  HD Safety Checks Performed Yes  Dialysis Fluid Bolus Normal Saline  Bolus Amount (mL) 250 mL  Intra-Hemodialysis Comments Tx initiated  Hemodialysis Catheter Right Subclavian  No Placement Date or Time found.   Placed prior to admission: Yes  Orientation: Right  Access Location: Subclavian  Site Condition No complications  Blue Lumen Status Infusing  Red Lumen Status Infusing  Dressing Status Clean;Dry;Intact;Dressing reinforced

## 2019-01-22 NOTE — Progress Notes (Signed)
LCSW Group Therapy Note  01/22/2019 11:28 AM  Type of Therapy/Topic:  Group Therapy:  Balance in Life  Participation Level:  Did Not Attend  Description of Group:    This group will address the concept of balance and how it feels and looks when one is unbalanced. Patients will be encouraged to process areas in their lives that are out of balance and identify reasons for remaining unbalanced. Facilitators will guide patients in utilizing problem-solving interventions to address and correct the stressor making their life unbalanced. Understanding and applying boundaries will be explored and addressed for obtaining and maintaining a balanced life. Patients will be encouraged to explore ways to assertively make their unbalanced needs known to significant others in their lives, using other group members and facilitator for support and feedback.  Therapeutic Goals: 1. Patient will identify two or more emotions or situations they have that consume much of in their lives. 2. Patient will identify signs/triggers that life has become out of balance:  3. Patient will identify two ways to set boundaries in order to achieve balance in their lives:  4. Patient will demonstrate ability to communicate their needs through discussion and/or role plays  Summary of Patient Progress: x     Therapeutic Modalities:   Cognitive Behavioral Therapy Solution-Focused Therapy Assertiveness Training  Evalina Field, MSW, LCSW Clinical Social Work 01/22/2019 11:28 AM

## 2019-01-22 NOTE — Progress Notes (Signed)
Recreation Therapy Notes  Date: 01/22/2019  Time: 9:30 am   Location: Craft room   Behavioral response: N/A   Intervention Topic: Self-Care  Discussion/Intervention: Patient did not attend group.   Clinical Observations/Feedback:  Patient did not attend group.   Xyla Leisner LRT/CTRS        Gayna Braddy 01/22/2019 10:44 AM

## 2019-01-22 NOTE — Progress Notes (Signed)
[facility] Daily Progress Note    Subjective: 3 Days Post-Op: 1.  Left arteriovenous graft cannulation under ultrasound guidance in both an antegrade and retrograde fashion 2.  Left arm shuntogram and left upper extremity angiogram including catheter placement into the left brachial artery through the retrograde sheath 3.  Percutaneous transluminal angioplasty of the left axillary vein and venous anastomosis with 7 mm diameter by 8 cm length Lutonix drug-coated angioplasty balloon 4.  Viabahn stent placement x2 with 8 mm diameter by 7.5 cm length stent and an 8 mm diameter by 5 cm length stent for high-grade residual stenosis and extravasation after angioplasty  Findings: 1. Left upper extremity angiogram demonstrated patent brachial artery and ulnar artery with a patent palmar arch in the hand filling the hand.  The radial artery was small and appeared to be occluded. 2. Graft had a high-grade stenosis of about 80% at the venous anastomosis and in the axillary vein several centimeters beyond the venous anastomosis.  The vessel normalized in the subclavian vein and the remainder of the central venous circulation was patent.  Plan: 1) Placement of graft by Dr. Carlis Abbott on 12/26/18 2) Grafts need approximately four weeks to mature 3) Could try to cannulate after 01/26/19 4) Patient is welcome to follow up with [place] after discharge, if not he should make an appointment with Dr. Anell Barr office for continued care  Vascular to sign off at this time  Marcelle Overlie PA-C 01/22/2019 10:22 AM

## 2019-01-22 NOTE — Progress Notes (Signed)
Pre HD Assessment   01/22/19 0900  Neurological  Level of Consciousness Alert  Orientation Level Oriented X4  Respiratory  Respiratory Pattern Regular  Chest Assessment Chest expansion symmetrical  Bilateral Breath Sounds Clear  Cardiac  Pulse Regular  Heart Sounds S1, S2  ECG Monitor Yes  Cardiac Rhythm NSR  Vascular  R Radial Pulse +2  L Radial Pulse +2  Edema Left upper extremity  LUE Edema +1  Integumentary  Integumentary (WDL) X  Skin Color Appropriate for ethnicity  Skin Condition Dry  Musculoskeletal  Musculoskeletal (WDL) WDL  Gastrointestinal  Bowel Sounds Assessment Active  GU Assessment  Genitourinary (WDL) X  Genitourinary Symptoms Oliguria (HD pt. )  Psychosocial  Psychosocial (WDL) X  Patient Behaviors Cooperative;Calm  Needs Expressed Physical  Emotional support given Given to patient

## 2019-01-22 NOTE — Plan of Care (Signed)
Patient had dialysis today.Appropriate in the unit.Affect is getting brighter.Denies SI,HI and AVH.Appetite and energy level good.Attended groups.Support and encouragement given.

## 2019-01-22 NOTE — Progress Notes (Signed)
[facility] MD Progress Note  01/22/2019 3:12 PM Brett Small  MRN:  [id number] Subjective: Patient seen chart reviewed.  I was able finally today to get through to his ex-wife in [place] as well.  Patient himself has no new complaints.  Mood is stable.  Physically stable.  Recovering fine from recent surgery.  Continues to get dialysis regularly.  No sign of psychosis.  Denies suicidal thoughts.  We had had some hope held out that he could possibly be discharged to stay with his ex-wife in [place].  I spoke with her this afternoon and she raised very realistic objections starting with the fact that a bus trip there takes several days and he would not be able to get dialysis along the way.  Furthermore she has concerns about possible risk of exposure to coronavirus because of the patient's medical problems.  On the whole she does not feel comfortable committing to having him come stay with her.  We are currently out of options that I am aware of. Principal Problem: Depression Diagnosis: Principal Problem:   Depression Active Problems:   End stage renal disease ([place])   Hypertension   Homelessness  Total Time spent with patient: 20 minutes  Past Psychiatric History: No past psychiatric history  Past Medical History:  Past Medical History:  Diagnosis Date  . Hypertension   . Renal disorder     Past Surgical History:  Procedure Laterality Date  . A/V SHUNT INTERVENTION Left 01/19/2019   Procedure: LEFT UPPER EXTREMITY A/V SHUNTOGRAM / UPPER EXTREMITY ANGIOGRAM;  Surgeon: Algernon Huxley, MD;  Location: [facility];  Service: Cardiovascular;  Laterality: Left;  . AORTA - FEMORAL ARTERY BYPASS GRAFT    . AV FISTULA PLACEMENT Left 12/26/2018   Procedure: INSERTION OF GORE STRETCH VASCULAR 4-7MM X  45CM IN LEFT UPPER ARM;  Surgeon: Marty Heck, MD;  Location: [facility];  Service: Vascular;  Laterality: Left;   Family History:  Family History  Problem Relation Age of Onset  .  Hypertension Other   . Diabetes Other   . Clotting disorder Father    Family Psychiatric  History: None known Social History:  Social History   Substance and Sexual Activity  Alcohol Use Not Currently     Social History   Substance and Sexual Activity  Drug Use Never    Social History   Socioeconomic History  . Marital status: Single    Spouse name: Not on file  . Number of children: Not on file  . Years of education: Not on file  . Highest education level: Not on file  Occupational History  . Not on file  Social Needs  . Financial resource strain: Not on file  . Food insecurity:    Worry: Not on file    Inability: Not on file  . Transportation needs:    Medical: Not on file    Non-medical: Not on file  Tobacco Use  . Smoking status: Former Smoker    Last attempt to quit: 06/26/2018    Years since quitting: 0.5  . Smokeless tobacco: Never Used  . Tobacco comment: smoked for 30 years   Substance and Sexual Activity  . Alcohol use: Not Currently  . Drug use: Never  . Sexual activity: Not on file  Lifestyle  . Physical activity:    Days per week: Not on file    Minutes per session: Not on file  . Stress: Not on file  Relationships  .  Social connections:    Talks on phone: Not on file    Gets together: Not on file    Attends religious service: Not on file    Active member of club or organization: Not on file    Attends meetings of clubs or organizations: Not on file    Relationship status: Not on file  Other Topics Concern  . Not on file  Social History Narrative  . Not on file   Additional Social History:                         Sleep: Fair  Appetite:  Fair  Current Medications: Current Facility-Administered Medications  Medication Dose Route Frequency Provider Last Rate Last Dose  . 0.9 %  sodium chloride infusion  100 mL Intravenous PRN Lateef, Munsoor, MD      . 0.9 %  sodium chloride infusion  100 mL Intravenous PRN Lateef, Munsoor, MD       . acetaminophen (TYLENOL) tablet 650 mg  650 mg Oral Q6H PRN Tennis Ship, MD   650 mg at 01/14/19 0219  . alteplase (CATHFLO ACTIVASE) injection 2 mg  2 mg Intracatheter Once PRN Lateef, Munsoor, MD      . amLODipine (NORVASC) tablet 5 mg  5 mg Oral Daily Tennis Ship, MD   5 mg at 01/22/19 1341  . apixaban (ELIQUIS) tablet 2.5 mg  2.5 mg Oral BID Egon Dittus, Madie Reno, MD   2.5 mg at 01/22/19 1339  . aspirin EC tablet 81 mg  81 mg Oral Daily Tennis Ship, MD   81 mg at 01/22/19 1340  . atorvastatin (LIPITOR) tablet 80 mg  80 mg Oral QHS Tennis Ship, MD   80 mg at 01/21/19 2118  . carvedilol (COREG) tablet 25 mg  25 mg Oral BID WC Tennis Ship, MD   25 mg at 01/21/19 1709  . Chlorhexidine Gluconate Cloth 2 % PADS 6 each  6 each Topical Q0600 Anthonette Legato, MD   6 each at 01/22/19 0541  . diphenhydrAMINE (BENADRYL) injection 50 mg  50 mg Intravenous Once PRN Stegmayer, Kimberly A, PA-C      . epoetin alfa (EPOGEN,PROCRIT) injection 10,000 Units  10,000 Units Intravenous Q T,Th,Sa-HD Holley Raring, Munsoor, MD   10,000 Units at 01/22/19 1141  . escitalopram (LEXAPRO) tablet 10 mg  10 mg Oral Daily Tennis Ship, MD   10 mg at 01/22/19 1340  . famotidine (PEPCID) tablet 40 mg  40 mg Oral Once PRN Stegmayer, Kimberly A, PA-C      . gabapentin (NEURONTIN) capsule 100 mg  100 mg Oral TID Ozell Ferrera T, MD   100 mg at 01/22/19 1340  . heparin injection 1,000 Units  1,000 Units Dialysis PRN Lateef, Munsoor, MD      . heparin injection 2,000 Units  2,000 Units Dialysis PRN Roney Jaffe, MD      . hydrALAZINE (APRESOLINE) tablet 25 mg  25 mg Oral Q8H O'Neal, Fredirick Maudlin, MD   25 mg at 01/22/19 1344  . HYDROmorphone (DILAUDID) injection 1 mg  1 mg Intravenous Once PRN Stegmayer, Kimberly A, PA-C      . hydrOXYzine (ATARAX/VISTARIL) tablet 25 mg  25 mg Oral TID PRN Tennis Ship, MD   25 mg at 01/20/19 2151  . ibuprofen (ADVIL,MOTRIN) tablet 400 mg  400 mg Oral Q6H PRN Davian Hanshaw T, MD   400 mg at  01/21/19 2118  . lidocaine (PF) (XYLOCAINE) 1 % injection 5 mL  5 mL Intradermal PRN Lateef, Munsoor, MD      . lidocaine-prilocaine (EMLA) cream 1 application  1 application Topical PRN Lateef, Munsoor, MD      . methylPREDNISolone sodium succinate (SOLU-MEDROL) 125 mg/2 mL injection 125 mg  125 mg Intravenous Once PRN Stegmayer, Kimberly A, PA-C      . midazolam (VERSED) 2 MG/ML syrup 8 mg  8 mg Oral Once PRN Stegmayer, Kimberly A, PA-C      . multivitamin (RENA-VIT) tablet 1 tablet  1 tablet Oral Daily Tennis Ship, MD   1 tablet at 01/22/19 1344  . neomycin-bacitracin-polymyxin (NEOSPORIN) ointment   Topical BID Waniya Hoglund T, MD      . ondansetron (ZOFRAN) injection 4 mg  4 mg Intravenous Q6H PRN Stegmayer, Kimberly A, PA-C      . pentafluoroprop-tetrafluoroeth (GEBAUERS) aerosol 1 application  1 application Topical PRN Lateef, Munsoor, MD      . sevelamer carbonate (RENVELA) tablet 1,600 mg  1,600 mg Oral TID WC Tennis Ship, MD   1,600 mg at 01/22/19 1340    Lab Results: No results found for this or any previous visit (from the past 48 hour(s)).  Blood Alcohol level:  No results found for: [facility]  Metabolic Disorder Labs: Lab Results  Component Value Date   HGBA1C 5.5 01/08/2019   MPG 111.15 01/08/2019   No results found for: PROLACTIN Lab Results  Component Value Date   CHOL 101 01/08/2019   TRIG 156 (H) 01/08/2019   HDL 33 (L) 01/08/2019   CHOLHDL 3.1 01/08/2019   VLDL 31 01/08/2019   LDLCALC 37 01/08/2019    Physical Findings: AIMS:  , ,  ,  ,    CIWA:    COWS:     Musculoskeletal: Strength & Muscle Tone: within normal limits Gait & Station: normal Patient leans: N/A  Psychiatric Specialty Exam: Physical Exam  Nursing note and vitals reviewed. Constitutional: He appears well-developed and well-nourished.  HENT:  Head: Normocephalic and atraumatic.  Eyes: Pupils are equal, round, and reactive to light. Conjunctivae are normal.  Neck: Normal range of  motion.  Cardiovascular: Regular rhythm and normal heart sounds.  Respiratory: Effort normal. No respiratory distress.  GI: Soft.  Musculoskeletal: Normal range of motion.  Neurological: He is alert.  Skin: Skin is warm and dry.  Psychiatric: He has a normal mood and affect. His behavior is normal. Judgment and thought content normal.    Review of Systems  Constitutional: Negative.   HENT: Negative.   Eyes: Negative.   Respiratory: Negative.   Cardiovascular: Negative.   Gastrointestinal: Negative.   Musculoskeletal: Negative.   Skin: Negative.   Neurological: Negative.   Psychiatric/Behavioral: Negative.     Blood pressure (!) 142/80, pulse 88, temperature 98.4 F (36.9 C), temperature source Oral, resp. rate 14, height '5\' 1"'  (1.549 m), weight 49.4 kg, SpO2 100 %.Body mass index is 20.58 kg/m.  General Appearance: Casual  Eye Contact:  Fair  Speech:  Clear and Coherent  Volume:  Decreased  Mood:  Euthymic  Affect:  Congruent  Thought Process:  Coherent  Orientation:  Full (Time, Place, and Person)  Thought Content:  Logical  Suicidal Thoughts:  No  Homicidal Thoughts:  No  Memory:  Immediate;   Fair Recent;   Fair Remote;   Fair  Judgement:  Fair  Insight:  Fair  Psychomotor Activity:  Normal  Concentration:  Concentration: Fair  Recall:  [employer] of Knowledge:  Fair  Language:  Fair  Akathisia:  No  Handed:  Right  AIMS (if indicated):     Assets:  Communication Skills Desire for Improvement Financial Resources/Insurance Resilience  ADL's:  Intact  Cognition:  WNL  Sleep:  Number of Hours: 7.15     Treatment Plan Summary: Daily contact with patient to assess and evaluate symptoms and progress in treatment, Medication management and Plan Patient without past psychiatric history who currently is free of any acute symptoms of psychiatric illness.  The current circumstances have left this with no good options right now for discharge.  Patient does get a  check but it is only for about $500 a month.  His ex-wife pointed out to me that he could potentially have this increased because she believes that the current amount was based on him living with his disabled father and that if he is independent he may be entitled to a few $100 more.  Nevertheless it is a small amount of money and right now he has no resources to find a stable place to live.  No group homes are taking new clients in [place] anyway.  No shelters in the area are allowing new residence.  Patient will absolutely need to have dialysis needs met when he is discharged.  All of this adds to the complications.  For now no acute needs or anything we need to change.  Patient may be stuck here for a while particularly because of the coronavirus situation.  Alethia Berthold, MD 01/22/2019, 3:12 PM

## 2019-01-22 NOTE — Progress Notes (Signed)
HD Tx End   01/22/19 1245  Hand-Off documentation  Report given to (Full Name) [facility]  Report received from (Full Name) Trellis Paganini RN  Vital Signs  Temp 98.4 F (36.9 C)  Temp Source Oral  Pulse Rate 87  Pulse Rate Source Monitor  Resp 14  BP (!) 144/88  BP Location Right Arm  BP Method Automatic  Patient Position (if appropriate) Sitting  Oxygen Therapy  SpO2 100 %  O2 Device Room Air  Pain Assessment  Pain Scale 0-10  Pain Score 7  Pain Type Acute pain;Surgical pain  Pain Location  (left arm pain / pressure due to previous surgery)  During Hemodialysis Assessment  Blood Flow Rate (mL/min) 200 mL/min  Arterial Pressure (mmHg) -170 mmHg  Venous Pressure (mmHg) 160 mmHg  Transmembrane Pressure (mmHg) 40 mmHg  Ultrafiltration Rate (mL/min) 540 mL/min  Dialysate Flow Rate (mL/min) 600 ml/min  Conductivity: Machine  14  HD Safety Checks Performed Yes  KECN 78.4 KECN  Dialysis Fluid Bolus Normal Saline  Bolus Amount (mL) 250 mL  Intra-Hemodialysis Comments Tolerated well;Tx completed

## 2019-01-22 NOTE — Progress Notes (Signed)
Post HD Tx   01/22/19 1250  Vital Signs  Pulse Rate 88  Resp 14  BP (!) 142/80  Oxygen Therapy  SpO2 100 %  Post-Hemodialysis Assessment  Rinseback Volume (mL) 250 mL  KECN 78.4 V  Dialyzer Clearance Lightly streaked  Duration of HD Treatment -hour(s) 3.5 hour(s)  Hemodialysis Intake (mL) 500 mL  UF Total -Machine (mL) 1500 mL  Net UF (mL) 1000 mL  Tolerated HD Treatment Yes  Hemodialysis Catheter Right Subclavian  No Placement Date or Time found.   Placed prior to admission: Yes  Orientation: Right  Access Location: Subclavian  Site Condition No complications  Blue Lumen Status Flushed;Saline locked;Capped (Central line);Heparin locked  Red Lumen Status Flushed;Saline locked;Capped (Central line);Heparin locked  Purple Lumen Status N/A  Catheter fill solution Heparin 1000 units/ml  Catheter fill volume (Arterial) 1.5 cc  Catheter fill volume (Venous) 1.5  Dressing Type Biopatch  Dressing Status Clean;Dry;Intact  Drainage Description None  Post treatment catheter status Capped and Clamped

## 2019-01-22 NOTE — Progress Notes (Signed)
Post HD Assessment  1.5kg removed, pt tolerated HD well.   01/22/19 1258  Neurological  Level of Consciousness Alert  Orientation Level Oriented X4  Respiratory  Respiratory Pattern Regular  Chest Assessment Chest expansion symmetrical  Bilateral Breath Sounds Clear  Cardiac  Pulse Regular  Heart Sounds S1, S2  ECG Monitor Yes  Cardiac Rhythm NSR  Vascular  R Radial Pulse +2  L Radial Pulse +2  Edema Left upper extremity  LUE Edema +1  Integumentary  Integumentary (WDL) X  Skin Color Appropriate for ethnicity  Skin Condition Dry  Musculoskeletal  Musculoskeletal (WDL) WDL  Gastrointestinal  Bowel Sounds Assessment Active  GU Assessment  Genitourinary (WDL) X  Genitourinary Symptoms Oliguria (HD pt. )  Psychosocial  Psychosocial (WDL) X  Patient Behaviors Cooperative;Calm  Needs Expressed Physical  Emotional support given Given to patient

## 2019-01-22 NOTE — Progress Notes (Signed)
[facility]  ROUNDING NOTE   Subjective:   Seen and examined on hemodialysis. Seated in chair.   Denies any acute complaints or shortness of breath today.   HEMODIALYSIS FLOWSHEET:  Blood Flow Rate (mL/min): 200 mL/min Arterial Pressure (mmHg): -170 mmHg Venous Pressure (mmHg): 160 mmHg Transmembrane Pressure (mmHg): 40 mmHg Ultrafiltration Rate (mL/min): 540 mL/min Dialysate Flow Rate (mL/min): 600 ml/min Conductivity: Machine : 14 Conductivity: Machine : 14 Dialysis Fluid Bolus: Normal Saline Bolus Amount (mL): 250 mL     Objective:  Vital signs in last 24 hours:  Temp:  [98.4 F (36.9 C)-98.5 F (36.9 C)] 98.4 F (36.9 C) (04/02 1245) Pulse Rate:  [85-94] 88 (04/02 1250) Resp:  [14-18] 14 (04/02 1250) BP: (128-151)/(72-88) 142/80 (04/02 1250) SpO2:  [95 %-100 %] 100 % (04/02 1250) Weight:  [49.4 kg] 49.4 kg (04/02 0910)  Weight change:  Filed Weights   01/19/19 0852 01/20/19 1030 01/22/19 0910  Weight: 49.4 kg 49.4 kg 49.4 kg    Intake/Output: No intake/output data recorded.   Intake/Output this shift:  Total I/O In: -  Out: 1000 [Other:1000]  Physical Exam: General: No acute distress  Head: Normocephalic, atraumatic. Moist oral mucosal membranes  Eyes: Anicteric  Neck: Supple,   Lungs:  Clear to auscultation, normal effort  Heart: regular  Abdomen:  Soft, nontender, bowel sounds present  Extremities:  Trace peripheral edema.  Neurologic: Awake, alert, oriented  Skin: No lesions  Psych Depressed affect  Access: IJ PermCath, left arm AVG +thrill and +bruit    Basic Metabolic Panel: Recent Labs  Lab 01/16/19 0701 01/18/19 0649 01/19/19 0623  NA 136 139 141  K 4.6 4.0 4.1  CL 100 101 105  CO2 25 27 26   GLUCOSE 113* 93 87  BUN 34* 28* 44*  CREATININE 5.13* 5.10* 7.51*  CALCIUM 8.4* 8.4* 8.3*  PHOS 2.7 2.9  --     Liver Function Tests: Recent Labs  Lab 01/16/19 0701 01/18/19 0649  ALBUMIN 3.5 3.6   No results for  input(s): LIPASE, AMYLASE in the last 168 hours. No results for input(s): AMMONIA in the last 168 hours.  CBC: Recent Labs  Lab 01/16/19 0701 01/18/19 0649  WBC 5.8 5.8  HGB 8.2* 9.2*  HCT 25.5* 28.8*  MCV 92.4 94.7  PLT 190 192    Cardiac Enzymes: No results for input(s): CKTOTAL, CKMB, CKMBINDEX, TROPONINI in the last 168 hours.  BNP: Invalid input(s): POCBNP  CBG: No results for input(s): GLUCAP in the last 168 hours.  Microbiology: Results for orders placed or performed during the hospital encounter of 12/24/18  MRSA PCR Screening     Status: None   Collection Time: 12/27/18  1:20 PM  Result Value Ref Range Status   MRSA by PCR NEGATIVE NEGATIVE Final    Comment:        The GeneXpert MRSA Assay (FDA approved for NASAL specimens only), is one component of a comprehensive MRSA colonization surveillance program. It is not intended to diagnose MRSA infection nor to guide or monitor treatment for MRSA infections. Performed at [facility], [place] [address], [place], [place] [address]     Coagulation Studies: No results for input(s): LABPROT, INR in the last 72 hours.  Urinalysis: No results for input(s): COLORURINE, LABSPEC, PHURINE, GLUCOSEU, HGBUR, BILIRUBINUR, KETONESUR, PROTEINUR, UROBILINOGEN, NITRITE, LEUKOCYTESUR in the last 72 hours.  Invalid input(s): APPERANCEUR    Imaging: No results found.   Medications:   . sodium chloride    .  sodium chloride     . amLODipine  5 mg Oral Daily  . apixaban  2.5 mg Oral BID  . aspirin EC  81 mg Oral Daily  . atorvastatin  80 mg Oral QHS  . carvedilol  25 mg Oral BID WC  . Chlorhexidine Gluconate Cloth  6 each Topical Q0600  . epoetin (EPOGEN/PROCRIT) injection  10,000 Units Intravenous Q T,Th,Sa-HD  . escitalopram  10 mg Oral Daily  . gabapentin  100 mg Oral TID  . hydrALAZINE  25 mg Oral Q8H  . multivitamin  1 tablet Oral Daily  . neomycin-bacitracin-polymyxin   Topical BID  . sevelamer  carbonate  1,600 mg Oral TID WC   sodium chloride, sodium chloride, acetaminophen, alteplase, diphenhydrAMINE, famotidine, heparin, heparin, HYDROmorphone (DILAUDID) injection, hydrOXYzine, ibuprofen, lidocaine (PF), lidocaine-prilocaine, methylPREDNISolone (SOLU-MEDROL) injection, midazolam, ondansetron (ZOFRAN) IV, pentafluoroprop-tetrafluoroeth  Assessment/ Plan:  Brett Small is a 43 y.o. white male with end stage renal disease on hemodialysis, hypertension, depression, aortobifemoral bypass, who was admitted to [facility] on 01/07/2019 for evaluation and treatment of major depressive disorder.  Patient is homeless - No dialysis unit assigned  1.  ESRD: seen and examined on hemodialysis treatment. Tolerating treatment well.   2.  Anemia chronic kidney disease:   - EPO with HD treatment -Hemoglobin 9.2, monitor  3.  Secondary hyperparathyroidism with hyperphosphatemia:  - Sevelamer with meals  4.  Hypertension.   Current regimen of amlodipine, carvedilol, hydralazine.    5. Dialysis access device: maturing left AVG placed on 3/6 by Dr. Carlis Abbott. Now with some numbness - underwent angiogram on 3/30 with angioplasty and placement of stent by Dr Lucky Cowboy   LOS: [place] 4/2/20203:19 PM

## 2019-01-22 NOTE — Progress Notes (Deleted)
HD Tx Start   01/22/19 0910  Hand-Off documentation  Report given to (Full Name) Trellis Paganini RN  Report received from (Full Name) Tyler Pita RN [facility]  Vital Signs  Pulse Rate 85  Pulse Rate Source Monitor  Resp 17  BP 136/80  BP Location Right Arm  BP Method Automatic  Patient Position (if appropriate) Sitting  Oxygen Therapy  SpO2 98 %  O2 Device Room Air  Pain Assessment  Pain Scale 0-10  Pain Score 7  Pain Type Acute pain;Surgical pain  Pain Location Arm  Pain Orientation Left  Pain Radiating Towards  (towards fingertips / numbness/tingling in hand)  Pain Descriptors / Indicators Heaviness;Pressure;Throbbing;Tingling  Pain Frequency Constant  Pain Intervention(s) RN made aware;Repositioned;Emotional support;Rest  Dialysis Weight  Weight 49.4 kg  Type of Weight Pre-Dialysis  Hemodialysis Catheter Right Subclavian  No Placement Date or Time found.   Placed prior to admission: Yes  Orientation: Right  Access Location: Subclavian  Site Condition No complications  Blue Lumen Status Infusing  Red Lumen Status Infusing  Dressing Status Clean;Dry;Intact;Dressing reinforced

## 2019-01-22 NOTE — Progress Notes (Signed)
Pre HD Tx   01/22/19 0900  Vital Signs  Temp 98.5 F (36.9 C)  Temp Source Oral  Pulse Rate 86  Pulse Rate Source Monitor  Resp 16  BP (!) 144/88  BP Location Right Arm  BP Method Automatic  Patient Position (if appropriate) Sitting  Oxygen Therapy  SpO2 97 %  O2 Device Room Air  Time-Out for Hemodialysis  What Procedure? Hemodialysis   Pt Identifiers(min of two) First/Last Name;MRN/Account#  Correct Site? Yes  Correct Side? Yes  Correct Procedure? Yes  Consents Verified? Yes  Rad Studies Available? N/A  Safety Precautions Reviewed? Yes  Engineer, civil (consulting) Number 4  Station Number 4  UF/Alarm Test Passed  Conductivity: Meter 14  Conductivity: Machine  14  pH 7.4  Reverse Osmosis Main  Normal Saline Lot Number [id number]  Dialyzer Lot Number [id number]  Disposable Set Lot Number [id number]  Machine Temperature 98.6 F (37 C)  Musician and Audible Yes  Blood Lines Intact and Secured Yes  Pre Treatment Patient Checks  Vascular access used during treatment Catheter  Patient is receiving dialysis in a chair Yes  Hepatitis B Surface Antigen Results Negative  Date Hepatitis B Surface Antigen Drawn 12/25/18  Hepatitis B Surface Antibody  (<10)  Date Hepatitis B Surface Antibody Drawn 12/25/18  Hemodialysis Consent Verified Yes  Hemodialysis Standing Orders Initiated Yes  ECG (Telemetry) Monitor On Yes  Prime Ordered Normal Saline  Length of  DialysisTreatment -hour(s) 3.5 Hour(s)  Dialysis Treatment Comments Na 140  Dialyzer Elisio 17H NR  Dialysate 3K, 2.5 Ca  Dialysate Flow Ordered 600  Blood Flow Rate Ordered 400 mL/min  Ultrafiltration Goal 2 Liters  Dialysis Blood Pressure Support Ordered Normal Saline  Education / Care Plan  Dialysis Education Provided Yes  Documented Education in Care Plan Yes  Fistula / Graft Left Upper arm Arteriovenous vein graft  Placement Date/Time: 12/26/18 1024   Placed prior to admission: No  Orientation: Left  Access  Location: Upper arm  Access Type: (c) Arteriovenous vein graft  Site Condition Painful;Other (Comment) (noted edema, still some pain)  Fistula / Graft Assessment Present;Thrill;Bruit  Drainage Description None  Hemodialysis Catheter Right Subclavian  No Placement Date or Time found.   Placed prior to admission: Yes  Orientation: Right  Access Location: Subclavian  Site Condition No complications  Blue Lumen Status Capped (Central line)  Red Lumen Status Capped (Central line)  Purple Lumen Status N/A  Dressing Status Clean;Dry;Intact  Interventions Dressing reinforced

## 2019-01-23 LAB — CBC
HCT: 27.1 % — ABNORMAL LOW (ref 39.0–52.0)
Hemoglobin: 8.7 g/dL — ABNORMAL LOW (ref 13.0–17.0)
MCH: 31 pg (ref 26.0–34.0)
MCHC: 32.1 g/dL (ref 30.0–36.0)
MCV: 96.4 fL (ref 80.0–100.0)
Platelets: 176 10*3/uL (ref 150–400)
RBC: 2.81 MIL/uL — ABNORMAL LOW (ref 4.22–5.81)
RDW: 20.3 % — ABNORMAL HIGH (ref 11.5–15.5)
WBC: 5.3 10*3/uL (ref 4.0–10.5)
nRBC: 0 % (ref 0.0–0.2)

## 2019-01-23 LAB — CREATININE, SERUM
Creatinine, Ser: 5.31 mg/dL — ABNORMAL HIGH (ref 0.61–1.24)
GFR calc Af Amer: 14 mL/min — ABNORMAL LOW (ref >60)
GFR calc non Af Amer: 12 mL/min — ABNORMAL LOW (ref >60)

## 2019-01-23 NOTE — [facility] Group Notes (Signed)
LCSW Group Therapy Note  01/23/2019 1:00 PM  Type of Therapy and Topic:  Group Therapy:  Feelings around Relapse and Recovery  Participation Level:  Active   Description of Group:    Patients in this group will discuss emotions they experience before and after a relapse. They will process how experiencing these feelings, or avoidance of experiencing them, relates to having a relapse. Facilitator will guide patients to explore emotions they have related to recovery. Patients will be encouraged to process which emotions are more powerful. They will be guided to discuss the emotional reaction significant others in their lives may have to their relapse or recovery. Patients will be assisted in exploring ways to respond to the emotions of others without this contributing to a relapse.  Therapeutic Goals: 1. Patient will identify two or more emotions that lead to a relapse for them 2. Patient will identify two emotions that result when they relapse 3. Patient will identify two emotions related to recovery 4. Patient will demonstrate ability to communicate their needs through discussion and/or role plays   Summary of Patient Progress: Patient was present for group.  Patient was attentive and participated in group discussion.  As a group the patients identified the following negative emotions associated with a relapse: "fear, worthlessness, hopelessness, anxiousness, helplessness, ignored, failure, isolation and alone".  Patients reported the following as positive emotions they hoped to experiences "hope, helpful, happy, joy comforting".  Patient engaged in discussion on how working out, pets, exercises, TV and games have helped them to cope.    Therapeutic Modalities:   Cognitive Behavioral Therapy Solution-Focused Therapy Assertiveness Training Relapse Prevention Therapy   [name], MSW, LCSW 01/23/2019 12:34 PM

## 2019-01-23 NOTE — Plan of Care (Signed)
Pt "slept well", denies SI/ HI, depression 0/10, anxiety 4/10, goal is "getting out of here and contemplating the idea of a group home". Pt was educated on his care plan and understands plans and process. Collier Bullock RN Problem: Education: Goal: Knowledge of [facility] Education information/materials will improve Outcome: Progressing Goal: Emotional status will improve Outcome: Progressing Goal: Mental status will improve Outcome: Progressing Goal: Verbalization of understanding the information provided will improve Outcome: Progressing   Problem: Safety: Goal: Periods of time without injury will increase Outcome: Progressing  Problem: Education: Goal: Utilization of techniques to improve thought processes will improve Outcome: Progressing Goal: Knowledge of the prescribed therapeutic regimen will improve Outcome: Progressing   Problem: Coping: Goal: Coping ability will improve Outcome: Progressing Goal: Will verbalize feelings Outcome: Progressing   Problem: Acute Pain Goal: Pain control Description Patient will demonstrate personal actions to control pain.   Outcome: Progressing

## 2019-01-23 NOTE — Progress Notes (Signed)
[facility] MD Progress Note  01/23/2019 5:10 PM Brett Small  MRN:  [id number] Subjective: Follow-up for this gentleman with situational depression now resolved and social problems.  Patient has no new complaints.  He is starting to understand that it is unlikely that we will find any structured place for him to live.  Inking about going and finding himself a hotel room especially now that his check will be available.  No complaints of any suicidality no psychosis. Principal Problem: Depression Diagnosis: Principal Problem:   Depression Active Problems:   End stage renal disease ([place])   Hypertension   Homelessness  Total Time spent with patient: 15 minutes  Past Psychiatric History: No significant past history  Past Medical History:  Past Medical History:  Diagnosis Date  . Hypertension   . Renal disorder     Past Surgical History:  Procedure Laterality Date  . A/V SHUNT INTERVENTION Left 01/19/2019   Procedure: LEFT UPPER EXTREMITY A/V SHUNTOGRAM / UPPER EXTREMITY ANGIOGRAM;  Surgeon: Algernon Huxley, MD;  Location: [facility];  Service: Cardiovascular;  Laterality: Left;  . AORTA - FEMORAL ARTERY BYPASS GRAFT    . AV FISTULA PLACEMENT Left 12/26/2018   Procedure: INSERTION OF GORE STRETCH VASCULAR 4-7MM X  45CM IN LEFT UPPER ARM;  Surgeon: Marty Heck, MD;  Location: [facility];  Service: Vascular;  Laterality: Left;   Family History:  Family History  Problem Relation Age of Onset  . Hypertension Other   . Diabetes Other   . Clotting disorder Father    Family Psychiatric  History: See previous Social History:  Social History   Substance and Sexual Activity  Alcohol Use Not Currently     Social History   Substance and Sexual Activity  Drug Use Never    Social History   Socioeconomic History  . Marital status: Single    Spouse name: Not on file  . Number of children: Not on file  . Years of education: Not on file  . Highest education level: Not on file   Occupational History  . Not on file  Social Needs  . Financial resource strain: Not on file  . Food insecurity:    Worry: Not on file    Inability: Not on file  . Transportation needs:    Medical: Not on file    Non-medical: Not on file  Tobacco Use  . Smoking status: Former Smoker    Last attempt to quit: 06/26/2018    Years since quitting: 0.5  . Smokeless tobacco: Never Used  . Tobacco comment: smoked for 30 years   Substance and Sexual Activity  . Alcohol use: Not Currently  . Drug use: Never  . Sexual activity: Not on file  Lifestyle  . Physical activity:    Days per week: Not on file    Minutes per session: Not on file  . Stress: Not on file  Relationships  . Social connections:    Talks on phone: Not on file    Gets together: Not on file    Attends religious service: Not on file    Active member of club or organization: Not on file    Attends meetings of clubs or organizations: Not on file    Relationship status: Not on file  Other Topics Concern  . Not on file  Social History Narrative  . Not on file   Additional Social History:  Sleep: Good  Appetite:  Good  Current Medications: Current Facility-Administered Medications  Medication Dose Route Frequency Provider Last Rate Last Dose  . 0.9 %  sodium chloride infusion  100 mL Intravenous PRN Algernon Huxley, MD      . 0.9 %  sodium chloride infusion  100 mL Intravenous PRN Algernon Huxley, MD      . acetaminophen (TYLENOL) tablet 650 mg  650 mg Oral Q6H PRN Algernon Huxley, MD   650 mg at 01/14/19 0219  . alteplase (CATHFLO ACTIVASE) injection 2 mg  2 mg Intracatheter Once PRN Algernon Huxley, MD      . amLODipine (NORVASC) tablet 5 mg  5 mg Oral Daily Algernon Huxley, MD   5 mg at 01/23/19 4967  . apixaban (ELIQUIS) tablet 2.5 mg  2.5 mg Oral BID , Madie Reno, MD   2.5 mg at 01/23/19 0823  . aspirin EC tablet 81 mg  81 mg Oral Daily Algernon Huxley, MD   81 mg at 01/23/19 0820  .  atorvastatin (LIPITOR) tablet 80 mg  80 mg Oral QHS Algernon Huxley, MD   80 mg at 01/22/19 2118  . carvedilol (COREG) tablet 25 mg  25 mg Oral BID WC Algernon Huxley, MD   25 mg at 01/23/19 5916  . Chlorhexidine Gluconate Cloth 2 % PADS 6 each  6 each Topical Q0600 Algernon Huxley, MD   6 each at 01/23/19 0551  . epoetin alfa (EPOGEN,PROCRIT) injection 10,000 Units  10,000 Units Intravenous Q T,Th,Sa-HD Algernon Huxley, MD   10,000 Units at 01/22/19 1141  . escitalopram (LEXAPRO) tablet 10 mg  10 mg Oral Daily Algernon Huxley, MD   10 mg at 01/23/19 3846  . gabapentin (NEURONTIN) capsule 100 mg  100 mg Oral TID ,  T, MD   100 mg at 01/23/19 1133  . heparin injection 1,000 Units  1,000 Units Dialysis PRN Algernon Huxley, MD      . heparin injection 2,000 Units  2,000 Units Dialysis PRN Algernon Huxley, MD      . hydrALAZINE (APRESOLINE) tablet 25 mg  25 mg Oral Q8H Algernon Huxley, MD   25 mg at 01/23/19 1525  . HYDROmorphone (DILAUDID) injection 1 mg  1 mg Intravenous Once PRN Algernon Huxley, MD      . hydrOXYzine (ATARAX/VISTARIL) tablet 25 mg  25 mg Oral TID PRN Algernon Huxley, MD   25 mg at 01/23/19 0825  . ibuprofen (ADVIL,MOTRIN) tablet 400 mg  400 mg Oral Q6H PRN Algernon Huxley, MD   400 mg at 01/23/19 6599  . lidocaine (PF) (XYLOCAINE) 1 % injection 5 mL  5 mL Intradermal PRN Algernon Huxley, MD      . lidocaine-prilocaine (EMLA) cream 1 application  1 application Topical PRN Algernon Huxley, MD      . multivitamin (RENA-VIT) tablet 1 tablet  1 tablet Oral Daily Algernon Huxley, MD   1 tablet at 01/23/19 3570  . neomycin-bacitracin-polymyxin (NEOSPORIN) ointment   Topical BID Algernon Huxley, MD      . ondansetron [facility]) injection 4 mg  4 mg Intravenous Q6H PRN Algernon Huxley, MD      . pentafluoroprop-tetrafluoroeth (GEBAUERS) aerosol 1 application  1 application Topical PRN Algernon Huxley, MD      . sevelamer carbonate (RENVELA) tablet 1,600 mg  1,600 mg Oral TID WC Dew, Erskine Squibb, MD   1,600 mg  at 01/23/19 1133    Lab  Results:  Results for orders placed or performed during the hospital encounter of 01/07/19 (from the past 48 hour(s))  CBC     Status: Abnormal   Collection Time: 01/23/19  6:41 AM  Result Value Ref Range   WBC 5.3 4.0 - 10.5 K/uL   RBC 2.81 (L) 4.22 - 5.81 MIL/uL   Hemoglobin 8.7 (L) 13.0 - 17.0 g/dL   HCT 27.1 (L) 39.0 - 52.0 %   MCV 96.4 80.0 - 100.0 fL   MCH 31.0 26.0 - 34.0 pg   MCHC 32.1 30.0 - 36.0 g/dL   RDW 20.3 (H) 11.5 - 15.5 %   Platelets 176 150 - 400 K/uL   nRBC 0.0 0.0 - 0.2 %    Comment: Performed at [facility], [address] [place], [place] [address]  Creatinine, serum     Status: Abnormal   Collection Time: 01/23/19  6:41 AM  Result Value Ref Range   Creatinine, Ser 5.31 (H) 0.61 - 1.24 mg/dL   GFR calc non Af Amer 12 (L) >60 mL/min   GFR calc Af Amer 14 (L) >60 mL/min    Comment: Performed at [facility], [address] [place], [place] [address]    Blood Alcohol level:  No results found for: [facility]  Metabolic Disorder Labs: Lab Results  Component Value Date   HGBA1C 5.5 01/08/2019   MPG 111.15 01/08/2019   No results found for: PROLACTIN Lab Results  Component Value Date   CHOL 101 01/08/2019   TRIG 156 (H) 01/08/2019   HDL 33 (L) 01/08/2019   CHOLHDL 3.1 01/08/2019   VLDL 31 01/08/2019   LDLCALC 37 01/08/2019    Physical Findings: AIMS:  , ,  ,  ,    CIWA:    COWS:     Musculoskeletal: Strength & Muscle Tone: within normal limits Gait & Station: normal Patient leans: N/A  Psychiatric Specialty Exam: Physical Exam  Nursing note and vitals reviewed. Constitutional: He appears well-developed and well-nourished.  HENT:  Head: Normocephalic and atraumatic.  Eyes: Pupils are equal, round, and reactive to light. Conjunctivae are normal.  Neck: Normal range of motion.  Cardiovascular: Regular rhythm and normal heart sounds.  Respiratory: Effort normal.  GI: Soft.  Musculoskeletal: Normal range of motion.   Neurological: He is alert.  Skin: Skin is warm and dry.  Psychiatric: He has a normal mood and affect. His behavior is normal. Judgment and thought content normal.    Review of Systems  Constitutional: Negative.   HENT: Negative.   Eyes: Negative.   Respiratory: Negative.   Cardiovascular: Negative.   Gastrointestinal: Negative.   Musculoskeletal: Negative.   Skin: Negative.   Neurological: Negative.   Psychiatric/Behavioral: Negative.     Blood pressure 136/82, pulse 85, temperature 98.5 F (36.9 C), temperature source Oral, resp. rate 17, height 5\' 1"  (1.549 m), weight 49.4 kg, SpO2 100 %.Body mass index is 20.58 kg/m.  General Appearance: Casual  Eye Contact:  Fair  Speech:  Slow  Volume:  Decreased  Mood:  Dysphoric  Affect:  Constricted  Thought Process:  Goal Directed  Orientation:  Full (Time, Place, and Person)  Thought Content:  Logical  Suicidal Thoughts:  No  Homicidal Thoughts:  No  Memory:  Immediate;   Fair Recent;   Fair Remote;   Fair  Judgement:  Intact  Insight:  Fair  Psychomotor Activity:  Normal  Concentration:  Concentration: Fair  Recall:  [place] of Knowledge:  Fair  Language:  Fair  Akathisia:  No  Handed:  Right  AIMS (if indicated):     Assets:  Communication Skills Desire for Improvement Resilience Social Support  ADL's:  Intact  Cognition:  WNL  Sleep:  Number of Hours: 6.15     Treatment Plan Summary: Plan Patient continues to receive dialysis.  Stable from that standpoint.  Neatly dressed appropriate interaction.  Starting to get the picture that he needs to find a place to live with his own money.  Checks are here finally this time of the month and we will work on trying to get him a plan to get out of the hospital I would hope next week.  Alethia Berthold, MD 01/23/2019, 5:10 PM

## 2019-01-23 NOTE — Tx Team (Addendum)
Interdisciplinary Treatment and Diagnostic Plan Update  01/23/2019 Time of Session: 8:30am  Brett Small MRN: [id number]  Principal Diagnosis: Depression  Secondary Diagnoses: Principal Problem:   Depression Active Problems:   End stage renal disease ([place])   Hypertension   Homelessness   Current Medications:  Current Facility-Administered Medications  Medication Dose Route Frequency Provider Last Rate Last Dose  . 0.9 %  sodium chloride infusion  100 mL Intravenous PRN Algernon Huxley, MD      . 0.9 %  sodium chloride infusion  100 mL Intravenous PRN Algernon Huxley, MD      . acetaminophen (TYLENOL) tablet 650 mg  650 mg Oral Q6H PRN Algernon Huxley, MD   650 mg at 01/14/19 0219  . alteplase (CATHFLO ACTIVASE) injection 2 mg  2 mg Intracatheter Once PRN Algernon Huxley, MD      . amLODipine (NORVASC) tablet 5 mg  5 mg Oral Daily Algernon Huxley, MD   5 mg at 01/23/19 9735  . apixaban (ELIQUIS) tablet 2.5 mg  2.5 mg Oral BID Clapacs, Madie Reno, MD   2.5 mg at 01/23/19 0823  . aspirin EC tablet 81 mg  81 mg Oral Daily Algernon Huxley, MD   81 mg at 01/23/19 0820  . atorvastatin (LIPITOR) tablet 80 mg  80 mg Oral QHS Algernon Huxley, MD   80 mg at 01/22/19 2118  . carvedilol (COREG) tablet 25 mg  25 mg Oral BID WC Algernon Huxley, MD   25 mg at 01/23/19 3299  . Chlorhexidine Gluconate Cloth 2 % PADS 6 each  6 each Topical Q0600 Algernon Huxley, MD   6 each at 01/23/19 0551  . epoetin alfa (EPOGEN,PROCRIT) injection 10,000 Units  10,000 Units Intravenous Q T,Th,Sa-HD Algernon Huxley, MD   10,000 Units at 01/22/19 1141  . escitalopram (LEXAPRO) tablet 10 mg  10 mg Oral Daily Algernon Huxley, MD   10 mg at 01/23/19 2426  . gabapentin (NEURONTIN) capsule 100 mg  100 mg Oral TID Clapacs, John T, MD   100 mg at 01/23/19 1133  . heparin injection 1,000 Units  1,000 Units Dialysis PRN Algernon Huxley, MD      . heparin injection 2,000 Units  2,000 Units Dialysis PRN Algernon Huxley, MD      . hydrALAZINE (APRESOLINE) tablet 25 mg  25  mg Oral Q8H Algernon Huxley, MD   25 mg at 01/23/19 0609  . HYDROmorphone (DILAUDID) injection 1 mg  1 mg Intravenous Once PRN Algernon Huxley, MD      . hydrOXYzine (ATARAX/VISTARIL) tablet 25 mg  25 mg Oral TID PRN Algernon Huxley, MD   25 mg at 01/23/19 0825  . ibuprofen (ADVIL,MOTRIN) tablet 400 mg  400 mg Oral Q6H PRN Algernon Huxley, MD   400 mg at 01/23/19 8341  . lidocaine (PF) (XYLOCAINE) 1 % injection 5 mL  5 mL Intradermal PRN Algernon Huxley, MD      . lidocaine-prilocaine (EMLA) cream 1 application  1 application Topical PRN Algernon Huxley, MD      . multivitamin (RENA-VIT) tablet 1 tablet  1 tablet Oral Daily Algernon Huxley, MD   1 tablet at 01/23/19 9622  . neomycin-bacitracin-polymyxin (NEOSPORIN) ointment   Topical BID Algernon Huxley, MD      . ondansetron [facility]) injection 4 mg  4 mg Intravenous Q6H PRN Algernon Huxley, MD      .  pentafluoroprop-tetrafluoroeth (GEBAUERS) aerosol 1 application  1 application Topical PRN Dew, Erskine Squibb, MD      . sevelamer carbonate (RENVELA) tablet 1,600 mg  1,600 mg Oral TID WC Algernon Huxley, MD   1,600 mg at 01/23/19 1133   PTA Medications: Medications Prior to Admission  Medication Sig Dispense Refill Last Dose  . acetaminophen (TYLENOL) 325 MG tablet Take 650 mg by mouth every 6 (six) hours as needed for mild pain or headache.   Past Week at Unknown time  . amLODipine (NORVASC) 5 MG tablet Take 5 mg by mouth daily. hold if Systolic reading is <621   01/07/2019 at Unknown time  . apixaban (ELIQUIS) 2.5 MG TABS tablet Take 1 tablet (2.5 mg total) by mouth 2 (two) times daily. 60 tablet 0 01/07/2019 at Unknown time  . aspirin EC 81 MG tablet Take 81 mg by mouth daily.   01/07/2019 at Unknown time  . atorvastatin (LIPITOR) 80 MG tablet Take 80 mg by mouth at bedtime.   01/06/2019 at Unknown time  . B Complex-C-Folic Acid (NEPHRO-VITE PO) Take 1 tablet by mouth daily.   01/07/2019 at Unknown time  . carvedilol (COREG) 25 MG tablet Take 25 mg by mouth 2 (two) times daily with a  meal.   01/07/2019 at Unknown time  . escitalopram (LEXAPRO) 10 MG tablet Take 1 tablet (10 mg total) by mouth daily. 30 tablet 1 01/07/2019 at Unknown time  . gabapentin (NEURONTIN) 300 MG capsule Take 300 mg by mouth 3 (three) times daily.   01/07/2019 at Unknown time  . hydrALAZINE (APRESOLINE) 25 MG tablet Take 25 mg by mouth every 8 (eight) hours.   01/07/2019 at Unknown time  . hydrOXYzine (ATARAX/VISTARIL) 25 MG tablet Take 25 mg by mouth 3 (three) times daily as needed for anxiety.   Past Week at Unknown time  . sevelamer carbonate (RENVELA) 800 MG tablet Take 2 tablets (1,600 mg total) by mouth 3 (three) times daily with meals. 180 tablet 0 01/07/2019 at Unknown time    Patient Stressors: Health problems Medication change or noncompliance  Patient Strengths: Ability for insight Communication skills  Treatment Modalities: Medication Management, Group therapy, Case management,  1 to 1 session with clinician, Psychoeducation, Recreational therapy.   Physician Treatment Plan for Primary Diagnosis: Depression Long Term Goal(s): Improvement in symptoms so as ready for discharge Improvement in symptoms so as ready for discharge   Short Term Goals: Ability to verbalize feelings will improve Ability to disclose and discuss suicidal ideas Compliance with prescribed medications will improve  Medication Management: Evaluate patient's response, side effects, and tolerance of medication regimen.  Therapeutic Interventions: 1 to 1 sessions, Unit Group sessions and Medication administration.  Evaluation of Outcomes: Progressing  Physician Treatment Plan for Secondary Diagnosis: Principal Problem:   Depression Active Problems:   End stage renal disease ([place])   Hypertension   Homelessness  Long Term Goal(s): Improvement in symptoms so as ready for discharge Improvement in symptoms so as ready for discharge   Short Term Goals: Ability to verbalize feelings will improve Ability to disclose  and discuss suicidal ideas Compliance with prescribed medications will improve     Medication Management: Evaluate patient's response, side effects, and tolerance of medication regimen.  Therapeutic Interventions: 1 to 1 sessions, Unit Group sessions and Medication administration.  Evaluation of Outcomes: Progressing   RN Treatment Plan for Primary Diagnosis: Depression Long Term Goal(s): Knowledge of disease and therapeutic regimen to maintain health will improve  Short Term  Goals: Ability to demonstrate self-control, Ability to participate in decision making will improve, Ability to verbalize feelings will improve and Ability to disclose and discuss suicidal ideas  Medication Management: RN will administer medications as ordered by provider, will assess and evaluate patient's response and provide education to patient for prescribed medication. RN will report any adverse and/or side effects to prescribing provider.  Therapeutic Interventions: 1 on 1 counseling sessions, Psychoeducation, Medication administration, Evaluate responses to treatment, Monitor vital signs and CBGs as ordered, Perform/monitor CIWA, COWS, AIMS and Fall Risk screenings as ordered, Perform wound care treatments as ordered.  Evaluation of Outcomes: Progressing   LCSW Treatment Plan for Primary Diagnosis: Depression Long Term Goal(s): Safe transition to appropriate next level of care at discharge, Engage patient in therapeutic group addressing interpersonal concerns.  Short Term Goals: Engage patient in aftercare planning with referrals and resources, Increase social support, Increase ability to appropriately verbalize feelings, Increase emotional regulation and Facilitate acceptance of mental health diagnosis and concerns  Therapeutic Interventions: Assess for all discharge needs, 1 to 1 time with Social worker, Explore available resources and support systems, Assess for adequacy in community support network, Educate  family and significant other(s) on suicide prevention, Complete Psychosocial Assessment, Interpersonal group therapy.  Evaluation of Outcomes: Progressing   Progress in Treatment: Attending groups: Yes. Participating in groups: Yes. Taking medication as prescribed: Yes. Toleration medication: Yes. Family/Significant other contact made: Yes, individual(s) contacted:  SPE completed with the patient's father.  Patient understands diagnosis: Yes. Discussing patient identified problems/goals with staff: Yes. Medical problems stabilized or resolved: Yes. Denies suicidal/homicidal ideation: Yes. Issues/concerns per patient self-inventory: No. Other: none  New problem(s) identified: No, Describe:  none  New Short Term/Long Term Goal(s): elimination of symptoms of psychosis, medication management for mood stabilization; elimination of SI thoughts; development of comprehensive mental wellness  Patient Goals: "if I had a place to live I'd be ok"  Discharge Plan or Barriers: Pt is currently homeless.  Patient was open to group home placement, however at this time the group home declined to admit him due to concerns that he would not be committed to staying.  Patient was hoping to go to [place] with an ex-wife, however, she declined to let the patient stay due to concerns for Covid 19.  Patient reported today that he wanted to pay for a hotel room.    Reason for Continuation of Hospitalization: Anxiety Depression Medical Issues Medication stabilization Suicidal ideation  Estimated Length of Stay: 1-5 days  Recreational Therapy: Patient Stressors: N/A Patient Goal: Patient will engage in groups without prompting or encouragement from LRT x3 group sessions within 5 recreation therapy group sessions  Attendees: Patient: Brett Small 01/23/2019 2:33 PM  Physician: Dr. Weber Cooks, MD 01/23/2019 2:33 PM  Nursing:  01/23/2019 2:33 PM  RN Care Manager: 01/23/2019 2:33 PM  Social Worker: [name], LCSW 01/23/2019 2:33 PM  Recreational Therapist: 01/23/2019 2:33 PM  Other:  01/23/2019 2:33 PM  Other:  01/23/2019 2:33 PM  Other: 01/23/2019 2:33 PM    Scribe for Treatment Team: Rozann Lesches, LCSW 01/23/2019 2:33 PM

## 2019-01-23 NOTE — BHH Counselor (Signed)
CSW was stopped by the patient who reports that he would like to get a room at the [facility] in [place], [place] because they are the most affordable to him.    [name], MSW, LCSW 01/23/2019 2:33 PM

## 2019-01-23 NOTE — Progress Notes (Signed)
Recreation Therapy Notes   Date: 01/23/2019  Time: 9:30 am  Location: Craft Room  Behavioral response: Appropriate  Intervention Topic: Leisure  Discussion/Intervention:  Group content today was focused on leisure. The group defined what leisure is and some positive leisure activities they participate in. Individuals identified the difference between good and bad leisure. Participants expressed how they feel after participating in the leisure of their choice. The group discussed how they go about picking a leisure activity and if others are involved in their leisure activities. The patient stated how many leisure activities they too choose from and reasons why it is important to have leisure time. Individuals participated in the intervention "Exploration of Leisure" where they had a chance to identify new leisure activities as well as benefits of leisure. Clinical Observations/Feedback:  Patient came to group and defined leisure as relaxing. He identified listening to music and meditating as leisure activities he enjoys. Participant explained that it is important to take time for leisure to forget about problems. Individual was social with peers and staff while participating in group.  Roben Schliep LRT/CTRS         Raymond Bhardwaj 01/23/2019 10:40 AM

## 2019-01-23 NOTE — Plan of Care (Signed)
Pt. Endorses a normal mood. Pt. Denies si/hi/avh, can contract for safety. Pt. Chronic pain being managed utilizing MD orders.    Problem: Education: Goal: Emotional status will improve Outcome: Progressing Goal: Mental status will improve Outcome: Progressing   Problem: Safety: Goal: Periods of time without injury will increase Outcome: Progressing   Problem: Acute Pain Goal: Pain control Description Patient will demonstrate personal actions to control pain.   Outcome: Progressing

## 2019-01-23 NOTE — Progress Notes (Signed)
D:Pt during assessments denies SI/HI/AVH, contracts for safety. Pt is pleasant and cooperative. Pt. Endorses a normal mood. Pt. Continues to Report discharge ready.   A: Q x 15 minute observation checks were completed for safety. Patient was provided with education.Patient was given/offered medications per orders. Patient was encourage to attend groups, participate in unit activities and continue with plan of care. Pt. Chart and plans of care reviewed. Pt. Given support and encouragement.  R:Patient is complaint with medications and unit procedures. Pt. Goes to group and snack time, eating good. Pt. Pain being managed utilizing MD orders. Pt. Dialysis fistula and central line WDL. Pt. Ankle bandage clean, dry, and intact.             Precautionary checks every 15 minutes for safety maintained, room free of safety hazards, patient sustains no injury or falls during this shift. Will endorse care to next shift.

## 2019-01-24 LAB — RENAL FUNCTION PANEL
Albumin: 3 g/dL — ABNORMAL LOW (ref 3.5–5.0)
Anion gap: 9 (ref 5–15)
BUN: 30 mg/dL — ABNORMAL HIGH (ref 6–20)
CO2: 28 mmol/L (ref 22–32)
Calcium: 7.7 mg/dL — ABNORMAL LOW (ref 8.9–10.3)
Chloride: 104 mmol/L (ref 98–111)
Creatinine, Ser: 5.15 mg/dL — ABNORMAL HIGH (ref 0.61–1.24)
GFR calc Af Amer: 14 mL/min — ABNORMAL LOW (ref >60)
GFR calc non Af Amer: 12 mL/min — ABNORMAL LOW (ref >60)
Glucose, Bld: 134 mg/dL — ABNORMAL HIGH (ref 70–99)
Phosphorus: 2.5 mg/dL (ref 2.5–4.6)
Potassium: 3.7 mmol/L (ref 3.5–5.1)
Sodium: 141 mmol/L (ref 135–145)

## 2019-01-24 MED ORDER — AMLODIPINE BESYLATE 5 MG PO TABS
5.0000 mg | ORAL_TABLET | Freq: Every day | ORAL | Status: DC
  Administered 2019-01-25 – 2019-01-26 (×2): 5 mg via ORAL
  Filled 2019-01-24 (×2): qty 1

## 2019-01-24 MED ORDER — ALBUTEROL SULFATE (2.5 MG/3ML) 0.083% IN NEBU
2.5000 mg | INHALATION_SOLUTION | Freq: Once | RESPIRATORY_TRACT | Status: AC
  Administered 2019-01-24: 2.5 mg via RESPIRATORY_TRACT
  Filled 2019-01-24: qty 3

## 2019-01-24 NOTE — Plan of Care (Signed)
Pt. Endorses a normal mood. Pt. Denies si/hi/avh, can contract for safety. Pt. Monitored for safety per MD orders.    Problem: Education: Goal: Emotional status will improve Outcome: Progressing Goal: Mental status will improve Outcome: Progressing   Problem: Safety: Goal: Periods of time without injury will increase Outcome: Progressing

## 2019-01-24 NOTE — BHH Group Notes (Signed)
LCSW Group Therapy Note  01/24/2019 1:15pm  Type of Therapy and Topic:  Group Therapy:  Healthy Self Image and Positive Change  Participation Level:  Did Not Attend   Description of Group:  In this group, patients will compare and contrast their current "I am...." statements to the visions they identify as desirable for their lives.  Patients discuss fears and how they can make positive changes in their cognitions that will positively impact their behaviors.  Facilitator played a motivational 3-minute speech and patients were left with the task of thinking about what "I am...." statements they can start using in their lives immediately.  Therapeutic Goals: 1. Patient will state their current self-perception as expressed in an "I Am" statement 2. Patient will contrast this with their desired vision for their live 3. Patient will identify 3 fears that negatively impact their behavior 4. Patient will discuss cognitive distortions that stem from their fears 5. Patient will verbalize statements that challenge their cognitive distortions  Summary of Patient Progress:  Pt was invited to attend group but chose not to attend. CSW will continue to encourage pt to attend group throughout their admission.    Therapeutic Modalities Cognitive Behavioral Therapy Motivational Interviewing  Tamarius Rosenfield  CUEBAS-COLON, LCSW 01/24/2019 12:29 PM

## 2019-01-24 NOTE — Progress Notes (Signed)
Patient transferred to dialysis with BHT for safety.

## 2019-01-24 NOTE — Progress Notes (Signed)
Wound care and dressing change completed.

## 2019-01-24 NOTE — Progress Notes (Signed)
Patient developed severe shortness of breath, monitor revealed O2 saturation low-mid 90's. Dr.  made aware and breathalyzer ordered with improvement in respiratory status. Patient with coarse and fine rales in all lobes, 2+ pitting edema in LE bilaterally. Patient admits having eaten 2 cups of ice (equivalent of one cup of water) today.  Scheduled for dialysis today probably needs new dry weight and more teaching about his renal diet and how to avoid a repeat of this danger. Patient ordered oxygen 2 liters via nasal cannula and close monitoring to maintain O2 saturation >= 90%. Thus far patient tolerating oxygen therapy without difficulty. Acknowledges that he only usually urinates about 30-40 cc/day. Maintaining O2 saturation 94-96 on O2 @ 2L. Could benefit from more renal education emphasizing his role in keeping everything within appropriate ranges.

## 2019-01-24 NOTE — Progress Notes (Signed)
Post HD Assessment  2 Liters removed, pt tolerated tx well  Weaned off of oxygen throughout tx per MD - pt tolerated well and O2 saturation 96 on RA post tx.    01/24/19 1342  Neurological  Level of Consciousness Alert  Orientation Level Oriented X4  Respiratory  Respiratory Pattern Regular  Chest Assessment Chest expansion symmetrical  Bilateral Breath Sounds Clear;Diminished  Cough None  Cardiac  Pulse Regular  Heart Sounds S1, S2  ECG Monitor Yes  Cardiac Rhythm NSR  Vascular  R Radial Pulse +2  L Radial Pulse +2  Edema Left upper extremity  Integumentary  Integumentary (WDL) X  Skin Color Appropriate for ethnicity  Skin Condition Dry  Musculoskeletal  Musculoskeletal (WDL) WDL  Gastrointestinal  Bowel Sounds Assessment Active  GU Assessment  Genitourinary (WDL) X  Genitourinary Symptoms Oliguria (HD pt. )  Psychosocial  Psychosocial (WDL) X  Patient Behaviors Cooperative;Calm  Emotional support given Given to patient

## 2019-01-24 NOTE — Progress Notes (Signed)
Report received from [place], [place].

## 2019-01-24 NOTE — Progress Notes (Signed)
Pre HD Assessment   01/24/19 0950  Neurological  Level of Consciousness Alert  Orientation Level Oriented X4  Respiratory  Respiratory Pattern Regular  Chest Assessment Chest expansion symmetrical  Bilateral Breath Sounds Clear;Diminished  Cough None  Cardiac  Pulse Regular  Heart Sounds S1, S2  ECG Monitor Yes  Cardiac Rhythm NSR  Vascular  R Radial Pulse +2  L Radial Pulse +2  Edema Left upper extremity  Integumentary  Integumentary (WDL) X  Skin Color Appropriate for ethnicity  Skin Condition Dry  Musculoskeletal  Musculoskeletal (WDL) WDL  Gastrointestinal  Bowel Sounds Assessment Active  GU Assessment  Genitourinary (WDL) X  Genitourinary Symptoms Oliguria (HD pt. )  Psychosocial  Psychosocial (WDL) X  Patient Behaviors Cooperative;Calm  Emotional support given Given to patient

## 2019-01-24 NOTE — Progress Notes (Signed)
Patient back from dialysis.

## 2019-01-24 NOTE — Progress Notes (Signed)
Pre HD Tx   01/24/19 0945  Hand-Off documentation  Report given to (Full Name) Trellis Paganini RN   Report received from (Full Name) Glade Nurse RN  Vital Signs  Temp 98.9 F (37.2 C)  Temp Source Oral  Pulse Rate 88  Pulse Rate Source Monitor  Resp 12  BP 140/78  BP Location Right Arm  BP Method Automatic  Patient Position (if appropriate) Sitting  Oxygen Therapy  SpO2 99 %  O2 Device Nasal Cannula  O2 Flow Rate (L/min) 2 L/min  Pain Assessment  Pain Scale 0-10  Pain Score 0  Dialysis Weight  Weight 49.9 kg  Type of Weight Pre-Dialysis  Time-Out for Hemodialysis  What Procedure? Hemodialysis   Pt Identifiers(min of two) First/Last Name;MRN/Account#  Correct Site? Yes  Correct Side? Yes  Correct Procedure? Yes  Consents Verified? Yes  Rad Studies Available? N/A  Safety Precautions Reviewed? Yes  Engineer, civil (consulting) Number 2  Station Number 1  UF/Alarm Test Passed  Conductivity: Meter 14  Conductivity: Machine  14  pH 7.4  Reverse Osmosis Main  Normal Saline Lot Number [id number]  Dialyzer Lot Number [id number]  Disposable Set Lot Number [id number]  Machine Temperature 98.6 F (37 C)  Musician and Audible Yes  Blood Lines Intact and Secured Yes  Pre Treatment Patient Checks  Vascular access used during treatment Catheter  Patient is receiving dialysis in a chair Yes  Hepatitis B Surface Antigen Results Negative  Date Hepatitis B Surface Antigen Drawn 12/25/18  Hepatitis B Surface Antibody  (<10)  Date Hepatitis B Surface Antibody Drawn 12/25/18  Hemodialysis Consent Verified Yes  Hemodialysis Standing Orders Initiated Yes  ECG (Telemetry) Monitor On Yes  Prime Ordered Normal Saline  Length of  DialysisTreatment -hour(s) 3.5 Hour(s)  Dialysis Treatment Comments Na 140. Pre/Post Weight  Dialyzer Elisio 17H NR  Dialysate 2K, 2.5 Ca  Dialysate Flow Ordered 600  Blood Flow Rate Ordered 400 mL/min  Ultrafiltration Goal 2 Liters  Pre Treatment Labs  Renal panel;Hepatitis B Surface Antigen  Dialysis Blood Pressure Support Ordered Normal Saline  Education / Care Plan  Dialysis Education Provided Yes  Documented Education in Care Plan Yes  Fistula / Graft Left Upper arm Arteriovenous vein graft  Placement Date/Time: 12/26/18 1024   Placed prior to admission: No  Orientation: Left  Access Location: Upper arm  Access Type: (c) Arteriovenous vein graft  Site Condition No complications  Fistula / Graft Assessment Present;Thrill;Bruit  Status  (Not used for HD. )  Drainage Description None  Hemodialysis Catheter Right Subclavian  No Placement Date or Time found.   Placed prior to admission: Yes  Orientation: Right  Access Location: Subclavian  Site Condition No complications  Blue Lumen Status Capped (Central line)  Red Lumen Status Capped (Central line)  Dressing Status Clean;Dry;Dressing reinforced  Interventions Dressing reinforced  Drainage Description None

## 2019-01-24 NOTE — Progress Notes (Deleted)
Post HD Assessment   01/24/19 1342  Neurological  Level of Consciousness Alert  Orientation Level Oriented X4  Respiratory  Respiratory Pattern Regular  Chest Assessment Chest expansion symmetrical  Bilateral Breath Sounds Clear;Diminished  Cough None  Cardiac  Pulse Regular  Heart Sounds S1, S2  ECG Monitor Yes  Cardiac Rhythm NSR  Vascular  R Radial Pulse +2  L Radial Pulse +2  Edema Left upper extremity  Integumentary  Integumentary (WDL) X  Skin Color Appropriate for ethnicity  Skin Condition Dry  Musculoskeletal  Musculoskeletal (WDL) WDL  Gastrointestinal  Bowel Sounds Assessment Active  GU Assessment  Genitourinary (WDL) X  Genitourinary Symptoms Oliguria (HD pt. )  Psychosocial  Psychosocial (WDL) X  Patient Behaviors Cooperative;Calm  Emotional support given Given to patient

## 2019-01-24 NOTE — Progress Notes (Signed)
[facility] MD Progress Note  01/24/2019 9:52 AM Brett Small  MRN:  [id number] Subjective:  Brett Small is a 43yo M with a h/o depressive disorder and a medical h/o ESRD on dialysis, HTN, who was admitted to inpatient psych unit due to depression. Nursing notes reviewed. Patient complained of shortness of breath last night, put on 2L of O2 through nasal cannula. Patient seen this morning. Vitals are wnl, including pO2 at 97%. Patient is waiting for dialysis session. He reports feeling "allright now; I got some issues at night, but fine now". He reports he slept well on O2, he ate full breakfast. Reports "good" mood. Denies any thoughts of harming self or others. No any mental health complaints todaw whatsoever. He  Is compliant with medications. He says "I am ready for discharge. I can stay at a motel when I get some money"     Principal Problem: Depression Diagnosis: Principal Problem:   Depression Active Problems:   End stage renal disease ([place])   Hypertension   Homelessness  Total Time spent with patient: 20 minutes  Past Psychiatric History: see admission H&P  Past Medical History:  Past Medical History:  Diagnosis Date  . Hypertension   . Renal disorder     Past Surgical History:  Procedure Laterality Date  . A/V SHUNT INTERVENTION Left 01/19/2019   Procedure: LEFT UPPER EXTREMITY A/V SHUNTOGRAM / UPPER EXTREMITY ANGIOGRAM;  Surgeon: Algernon Huxley, MD;  Location: [place] [facility];  Service: Cardiovascular;  Laterality: Left;  . AORTA - FEMORAL ARTERY BYPASS GRAFT    . AV FISTULA PLACEMENT Left 12/26/2018   Procedure: INSERTION OF GORE STRETCH VASCULAR 4-7MM X  45CM IN LEFT UPPER ARM;  Surgeon: Marty Heck, MD;  Location: [facility];  Service: Vascular;  Laterality: Left;   Family History:  Family History  Problem Relation Age of Onset  . Hypertension Other   . Diabetes Other   . Clotting disorder Father    Family Psychiatric  History: see admission H&P Social History:   Social History   Substance and Sexual Activity  Alcohol Use Not Currently     Social History   Substance and Sexual Activity  Drug Use Never    Social History   Socioeconomic History  . Marital status: Single    Spouse name: Not on file  . Number of children: Not on file  . Years of education: Not on file  . Highest education level: Not on file  Occupational History  . Not on file  Social Needs  . Financial resource strain: Not on file  . Food insecurity:    Worry: Not on file    Inability: Not on file  . Transportation needs:    Medical: Not on file    Non-medical: Not on file  Tobacco Use  . Smoking status: Former Smoker    Last attempt to quit: 06/26/2018    Years since quitting: 0.5  . Smokeless tobacco: Never Used  . Tobacco comment: smoked for 30 years   Substance and Sexual Activity  . Alcohol use: Not Currently  . Drug use: Never  . Sexual activity: Not on file  Lifestyle  . Physical activity:    Days per week: Not on file    Minutes per session: Not on file  . Stress: Not on file  Relationships  . Social connections:    Talks on phone: Not on file    Gets together: Not on file    Attends religious service:  Not on file    Active member of club or organization: Not on file    Attends meetings of clubs or organizations: Not on file    Relationship status: Not on file  Other Topics Concern  . Not on file  Social History Narrative  . Not on file   Additional Social History:                         Sleep: Fair  Appetite:  Good  Current Medications: Current Facility-Administered Medications  Medication Dose Route Frequency Provider Last Rate Last Dose  . 0.9 %  sodium chloride infusion  100 mL Intravenous PRN Algernon Huxley, MD      . 0.9 %  sodium chloride infusion  100 mL Intravenous PRN Algernon Huxley, MD      . acetaminophen (TYLENOL) tablet 650 mg  650 mg Oral Q6H PRN Algernon Huxley, MD   650 mg at 01/14/19 0219  . alteplase (CATHFLO  ACTIVASE) injection 2 mg  2 mg Intracatheter Once PRN Algernon Huxley, MD      . Derrill Memo ON 01/25/2019] amLODipine (NORVASC) tablet 5 mg  5 mg Oral Q2000 Murlean Iba, MD      . apixaban (ELIQUIS) tablet 2.5 mg  2.5 mg Oral BID Clapacs, Madie Reno, MD   2.5 mg at 01/23/19 1748  . aspirin EC tablet 81 mg  81 mg Oral Daily Algernon Huxley, MD   81 mg at 01/23/19 0820  . atorvastatin (LIPITOR) tablet 80 mg  80 mg Oral QHS Algernon Huxley, MD   80 mg at 01/23/19 2145  . carvedilol (COREG) tablet 25 mg  25 mg Oral BID WC Murlean Iba, MD   Stopped at 01/24/19 1423  . Chlorhexidine Gluconate Cloth 2 % PADS 6 each  6 each Topical Q0600 Algernon Huxley, MD   6 each at 01/23/19 0551  . epoetin alfa (EPOGEN,PROCRIT) injection 10,000 Units  10,000 Units Intravenous Q T,Th,Sa-HD Algernon Huxley, MD   10,000 Units at 01/22/19 1141  . escitalopram (LEXAPRO) tablet 10 mg  10 mg Oral Daily Algernon Huxley, MD   10 mg at 01/23/19 9532  . gabapentin (NEURONTIN) capsule 100 mg  100 mg Oral TID Clapacs, Madie Reno, MD   100 mg at 01/23/19 1747  . heparin injection 1,000 Units  1,000 Units Dialysis PRN Algernon Huxley, MD      . heparin injection 2,000 Units  2,000 Units Dialysis PRN Algernon Huxley, MD      . hydrALAZINE (APRESOLINE) tablet 25 mg  25 mg Oral Q8H Murlean Iba, MD   25 mg at 01/23/19 2147  . HYDROmorphone (DILAUDID) injection 1 mg  1 mg Intravenous Once PRN Algernon Huxley, MD      . hydrOXYzine (ATARAX/VISTARIL) tablet 25 mg  25 mg Oral TID PRN Algernon Huxley, MD   25 mg at 01/23/19 0825  . ibuprofen (ADVIL,MOTRIN) tablet 400 mg  400 mg Oral Q6H PRN Algernon Huxley, MD   400 mg at 01/23/19 0233  . lidocaine (PF) (XYLOCAINE) 1 % injection 5 mL  5 mL Intradermal PRN Algernon Huxley, MD      . lidocaine-prilocaine (EMLA) cream 1 application  1 application Topical PRN Algernon Huxley, MD      . multivitamin (RENA-VIT) tablet 1 tablet  1 tablet Oral Daily Algernon Huxley, MD   1 tablet at 01/23/19 4356  .  neomycin-bacitracin-polymyxin (NEOSPORIN)  ointment   Topical BID Algernon Huxley, MD   1 application at 04/14/75 [phone]  . ondansetron (ZOFRAN) injection 4 mg  4 mg Intravenous Q6H PRN Algernon Huxley, MD      . pentafluoroprop-tetrafluoroeth (GEBAUERS) aerosol 1 application  1 application Topical PRN Algernon Huxley, MD      . sevelamer carbonate (RENVELA) tablet 1,600 mg  1,600 mg Oral TID WC Algernon Huxley, MD   Stopped at 01/24/19 [phone]    Lab Results:  Results for orders placed or performed during the hospital encounter of 01/07/19 (from the past 48 hour(s))  CBC     Status: Abnormal   Collection Time: 01/23/19  6:41 AM  Result Value Ref Range   WBC 5.3 4.0 - 10.5 K/uL   RBC 2.81 (L) 4.22 - 5.81 MIL/uL   Hemoglobin 8.7 (L) 13.0 - 17.0 g/dL   HCT 27.1 (L) 39.0 - 52.0 %   MCV 96.4 80.0 - 100.0 fL   MCH 31.0 26.0 - 34.0 pg   MCHC 32.1 30.0 - 36.0 g/dL   RDW 20.3 (H) 11.5 - 15.5 %   Platelets 176 150 - 400 K/uL   nRBC 0.0 0.0 - 0.2 %    Comment: Performed at [facility], [address] [place], [place] [address]  Creatinine, serum     Status: Abnormal   Collection Time: 01/23/19  6:41 AM  Result Value Ref Range   Creatinine, Ser 5.31 (H) 0.61 - 1.24 mg/dL   GFR calc non Af Amer 12 (L) >60 mL/min   GFR calc Af Amer 14 (L) >60 mL/min    Comment: Performed at [facility], [address] [place], [place] [address]    Blood Alcohol level:  No results found for: [facility]  Metabolic Disorder Labs: Lab Results  Component Value Date   HGBA1C 5.5 01/08/2019   MPG 111.15 01/08/2019   No results found for: PROLACTIN Lab Results  Component Value Date   CHOL 101 01/08/2019   TRIG 156 (H) 01/08/2019   HDL 33 (L) 01/08/2019   CHOLHDL 3.1 01/08/2019   VLDL 31 01/08/2019   LDLCALC 37 01/08/2019    Physical Findings: AIMS:  , ,  ,  ,    CIWA:    COWS:     Musculoskeletal: Strength & Muscle Tone: decreased Gait & Station: normal Patient leans: N/A  Psychiatric Specialty Exam: Physical Exam  ROS  Blood  pressure 132/84, pulse 93, temperature 98 F (36.7 C), temperature source Oral, resp. rate 16, height 5\' 1"  (1.549 m), weight 49.9 kg, SpO2 97 %.Body mass index is 20.78 kg/m.  General Appearance: Casual  Eye Contact:  Good  Speech:  Normal Rate  Volume:  Normal  Mood:  Euthymic  Affect:  Appropriate, Congruent and Full Range  Thought Process:  Coherent, Goal Directed and Linear  Orientation:  Full (Time, Place, and Person)  Thought Content:  Logical  Suicidal Thoughts:  No  Homicidal Thoughts:  No  Memory:  Immediate;   Fair Recent;   Fair Remote;   Fair  Judgement:  Good  Insight:  Good  Psychomotor Activity:  Normal  Concentration:  Concentration: Fair and Attention Span: Fair  Recall:  [employer] of Knowledge:  Fair  Language:  Good  Akathisia:  No  Handed:  Right  AIMS (if indicated):     Assets:  Desire for Improvement  ADL's:  Intact  Cognition:  WNL  Sleep:  Number  of Hours: 5     Treatment Plan Summary: Daily contact with patient to assess and evaluate symptoms and progress in treatment   43yo M with a h/o depressive disorder and a medical h/o ESRD on dialysis, HTN, who is in inpatient psych unit due to depression. Today patient appears euthymic, he reports stable good mood, denies suicidal or homicidal thoughts. He denies any side effects from psych medications. Will continue current psych medications without changes. He is asking about discharge and states he can stay at the motel. Disposition will be determined by a main treatment teem next week.  Impression: MDD, recurrent, moderate, without psychotic features.  Plan: -continue inpatient psych admission; 15-minute checks; daily contact with patient to assess and evaluate symptoms and progress in treatment.  -continueLexapro10mg  PO daily for depression andHydroxyzine 25mg  PO TID PRN anxiety;  -continue all other medications for comorbnid medical conditions; dyalysis  x3/week.  -psychoeducation;  -encouragement to be involved in the unit;  -Disposition: to be determined.  Larita Fife, MD 01/24/2019, 9:52 AM

## 2019-01-24 NOTE — Progress Notes (Signed)
  Post HD Tx   01/24/19 1335  Vital Signs  Pulse Rate 91  Resp 16  BP 132/82  Oxygen Therapy  SpO2 98 %  O2 Device Room Air  Dialysis Weight  Type of Weight Post-Dialysis  Estimated Dry Weight 48.1 kg  Post-Hemodialysis Assessment  Rinseback Volume (mL) 250 mL  KECN 78.9 V  Dialyzer Clearance Lightly streaked  Duration of HD Treatment -hour(s) 3.5 hour(s)  Hemodialysis Intake (mL) 500 mL  UF Total -Machine (mL) 2500 mL  Net UF (mL) 2000 mL  Tolerated HD Treatment Yes  Hemodialysis Catheter Right Subclavian  No Placement Date or Time found.   Placed prior to admission: Yes  Orientation: Right  Access Location: Subclavian  Site Condition No complications  Blue Lumen Status Flushed;Saline locked;Capped (Central line);Heparin locked  Red Lumen Status Flushed;Saline locked;Capped (Central line);Heparin locked  Purple Lumen Status N/A  Catheter fill solution Heparin 1000 units/ml  Catheter fill volume (Arterial) 1.5 cc  Catheter fill volume (Venous) 1.5  Dressing Type Biopatch  Dressing Status Clean;Dry;Intact  Interventions Dressing reinforced  Drainage Description None  Dressing Change Due 01/27/19  Post treatment catheter status Capped and Clamped

## 2019-01-24 NOTE — Progress Notes (Signed)
Spoke to the patient in his room. Pt. Is alert and oriented x 4, denies any distress or trouble breathing currently. Pt. Prior to this writer coming on shift has been on 2L/[place] and remains on 2L. Call placed to dialysis to see when patient is able to be taken for dialyzing, but no response. Will continue to monitor closely for safety.

## 2019-01-24 NOTE — Progress Notes (Signed)
Received  call from RN that patient was complaining of shortness of breath. Patient is ESRD patient; receives dialysis Tues, Thurs, Sat. RN reported patient has 2+ Lower extremity edema; Oxygen sat 89-91%.  I provided order to call Respiratory team to evaluate patient and to give patient a breathing treatment. After breathing treatment, RN reported patient's Oxygen sat improved to 94%. Respiratory Rate 20-21 per min.  Patient was feelin g better.  I discussed case with on call nephrologist (Dr. Candiss Norse, [facility]). Recommendation is to keep patient on continuous Oxygen nasal cannula (start with 2L and can increase per patient's need in order to keep patient's oxygen saturation above 90%).  Patient will be dialyzed this morning (Saturday 01/24/2019).  Unit RN provided with verbal order for Oxygen nasal cannula per above. Also I provided verbal or der for continuous observation while on Oxygen nasal cannula and to notify me if there are any other concerns or changes regarding the patient.   Tennis Ship, M.D.  Attending Psychiatrist on Call

## 2019-01-24 NOTE — Progress Notes (Signed)
Spoke with MD in dialysis. Pt. Is first on the list to be dialyzed.

## 2019-01-24 NOTE — Progress Notes (Signed)
[facility]  ROUNDING NOTE   Subjective:   Seen and examined on hemodialysis. Seated in chair.  Discussed case with psychiatrist last night at about 2.45 am. concern about fluid overload and increasing oxygen requirement of 2 L nasal cannula oxygen.  Scheduled patient for 1st shift HD this AM. Patient states he was trying to some ice chips Added fluid restriction to his diet   HEMODIALYSIS FLOWSHEET:  Blood Flow Rate (mL/min): 400 mL/min Arterial Pressure (mmHg): -180 mmHg Venous Pressure (mmHg): 160 mmHg Transmembrane Pressure (mmHg): 40 mmHg Ultrafiltration Rate (mL/min): 570 mL/min Dialysate Flow Rate (mL/min): 600 ml/min Conductivity: Machine : 14 Conductivity: Machine : 14 Dialysis Fluid Bolus: Normal Saline Bolus Amount (mL): 250 mL     Objective:  Vital signs in last 24 hours:  Temp:  [97.9 F (36.6 C)-98.9 F (37.2 C)] 98.9 F (37.2 C) (04/04 0945) Pulse Rate:  [88-111] 89 (04/04 1100) Resp:  [12-18] 12 (04/04 1100) BP: (122-140)/(72-88) 138/77 (04/04 1100) SpO2:  [91 %-99 %] 97 % (04/04 1100) Weight:  [49.9 kg] 49.9 kg (04/04 0945)  Weight change: 0.496 kg Filed Weights   01/22/19 0910 01/24/19 0700 01/24/19 0945  Weight: 49.4 kg 49.9 kg 49.9 kg    Intake/Output: No intake/output data recorded.   Intake/Output this shift:  No intake/output data recorded.  Physical Exam: General: No acute distress  Head: Normocephalic, atraumatic. Moist oral mucosal membranes  Eyes: Anicteric  Neck: Supple,   Lungs:  Clear to auscultation, normal effort  Heart: regular  Abdomen:  Soft, nontender, bowel sounds present  Extremities:  Trace peripheral edema.  Neurologic: Awake, alert, oriented  Skin: No lesions  Psych Depressed affect  Access: IJ PermCath, left arm AVG +thrill and +bruit    Basic Metabolic Panel: Recent Labs  Lab 01/18/19 0649 01/19/19 0623 01/23/19 0641 01/24/19 1019  NA 139 141  --  141  K 4.0 4.1  --  3.7  CL 101 105  --  104   CO2 27 26  --  28  GLUCOSE 93 87  --  134*  BUN 28* 44*  --  30*  CREATININE 5.10* 7.51* 5.31* 5.15*  CALCIUM 8.4* 8.3*  --  7.7*  PHOS 2.9  --   --  2.5    Liver Function Tests: Recent Labs  Lab 01/18/19 0649 01/24/19 1019  ALBUMIN 3.6 3.0*   No results for input(s): LIPASE, AMYLASE in the last 168 hours. No results for input(s): AMMONIA in the last 168 hours.  CBC: Recent Labs  Lab 01/18/19 0649 01/23/19 0641  WBC 5.8 5.3  HGB 9.2* 8.7*  HCT 28.8* 27.1*  MCV 94.7 96.4  PLT 192 176    Cardiac Enzymes: No results for input(s): CKTOTAL, CKMB, CKMBINDEX, TROPONINI in the last 168 hours.  BNP: Invalid input(s): POCBNP  CBG: No results for input(s): GLUCAP in the last 168 hours.  Microbiology: Results for orders placed or performed during the hospital encounter of 12/24/18  MRSA PCR Screening     Status: None   Collection Time: 12/27/18  1:20 PM  Result Value Ref Range Status   MRSA by PCR NEGATIVE NEGATIVE Final    Comment:        The GeneXpert MRSA Assay (FDA approved for NASAL specimens only), is one component of a comprehensive MRSA colonization surveillance program. It is not intended to diagnose MRSA infection nor to guide or monitor treatment for MRSA infections. Performed at [facility], [place] [address] [place], [place]  [address]     Coagulation Studies: No results for input(s): LABPROT, INR in the last 72 hours.  Urinalysis: No results for input(s): COLORURINE, LABSPEC, PHURINE, GLUCOSEU, HGBUR, BILIRUBINUR, KETONESUR, PROTEINUR, UROBILINOGEN, NITRITE, LEUKOCYTESUR in the last 72 hours.  Invalid input(s): APPERANCEUR    Imaging: No results found.   Medications:   . sodium chloride    . sodium chloride     . [START ON 01/25/2019] amLODipine  5 mg Oral Q2000  . apixaban  2.5 mg Oral BID  . aspirin EC  81 mg Oral Daily  . atorvastatin  80 mg Oral QHS  . carvedilol  25 mg Oral BID WC  . Chlorhexidine Gluconate Cloth  6 each  Topical Q0600  . epoetin (EPOGEN/PROCRIT) injection  10,000 Units Intravenous Q T,Th,Sa-HD  . escitalopram  10 mg Oral Daily  . gabapentin  100 mg Oral TID  . hydrALAZINE  25 mg Oral Q8H  . multivitamin  1 tablet Oral Daily  . neomycin-bacitracin-polymyxin   Topical BID  . sevelamer carbonate  1,600 mg Oral TID WC   sodium chloride, sodium chloride, acetaminophen, alteplase, heparin, heparin, HYDROmorphone (DILAUDID) injection, hydrOXYzine, ibuprofen, lidocaine (PF), lidocaine-prilocaine, ondansetron (ZOFRAN) IV, pentafluoroprop-tetrafluoroeth  Assessment/ Plan:  [name] is a 43 y.o. white male with end stage renal disease on hemodialysis, hypertension, depression, aortobifemoral bypass, who was admitted to [facility] on 01/07/2019 for evaluation and treatment of major depressive disorder.  Patient is homeless - No dialysis unit assigned  1.  ESRD: seen and examined on hemodialysis treatment. Tolerating treatment well.  - Acute volume overload last night requiring O2 supplementation - UF goal 2-2.5 L as Tolerated - Try to wean off Oxygen   2.  Anemia chronic kidney disease:   - EPO with HD treatment -Hemoglobin 8.7, monitor  3.  Secondary hyperparathyroidism with hyperphosphatemia:  - Sevelamer with meals  4.  Hypertension with CKD Current regimen of amlodipine, carvedilol, hydralazine.    5. Dialysis access device: maturing left AVG placed on 3/6 by Dr. Carlis Abbott.   - underwent angiogram on 3/30 with angioplasty and placement of stent by Dr Lucky Cowboy   LOS: [place] 4/4/202011:12 AM

## 2019-01-24 NOTE — Progress Notes (Signed)
D: Pt during assessments denies SI/HI/AVH, able to contract for safety. Pt is pleasant and cooperative, engages good. Pt. denies this morning any distress, states breathing has returned to normal since this RN's arrival to unit for shift. Pt. Endorses a normal mood.   A: Q x 15 minute observation checks to be completed for safety. Patient was provided with education.  Patient was given/offered medications per orders. Patient  was encourage to attend groups, participate in unit activities and continue with plan of care. Pt. Chart and plans of care reviewed. Pt. Given support and encouragement.   R: Patient this morning is complaint with unit procedures. Pt. Dressing changed and cleaned per MD orders. Pt. Fistula and central line WDL. Pt. Weighed this morning prior to dialysis. Patient for dialysis today.

## 2019-01-24 NOTE — Progress Notes (Signed)
HD Tx Start   01/24/19 1000  Vital Signs  Pulse Rate 89  Resp 13  BP 138/72  Oxygen Therapy  SpO2 97 %  O2 Device Nasal Cannula  O2 Flow Rate (L/min) 1 L/min  During Hemodialysis Assessment  Blood Flow Rate (mL/min) 400 mL/min  Arterial Pressure (mmHg) -180 mmHg  Venous Pressure (mmHg) 160 mmHg  Transmembrane Pressure (mmHg) 40 mmHg  Ultrafiltration Rate (mL/min) 740 mL/min (740 mL removed per HOUR)  Dialysate Flow Rate (mL/min) 600 ml/min  Conductivity: Machine  14  HD Safety Checks Performed Yes  Dialysis Fluid Bolus Normal Saline  Bolus Amount (mL) 250 mL  Intra-Hemodialysis Comments Tx initiated

## 2019-01-24 NOTE — Progress Notes (Signed)
HD Tx End    01/24/19 1330  Hand-Off documentation  Report given to (Full Name) Glade Nurse RN  Report received from (Full Name) Trellis Paganini RN  Vital Signs  Pulse Rate 92  Pulse Rate Source Monitor  Resp 15  BP 138/77  BP Location Right Arm  BP Method Automatic  Patient Position (if appropriate) Sitting  Oxygen Therapy  SpO2 97 %  O2 Device Room Air  Pain Assessment  Pain Scale 0-10  Pain Score 0  During Hemodialysis Assessment  Blood Flow Rate (mL/min) 200 mL/min  Arterial Pressure (mmHg) -170 mmHg  Venous Pressure (mmHg) 160 mmHg  Transmembrane Pressure (mmHg) 50 mmHg  Ultrafiltration Rate (mL/min) 740 mL/min  Dialysate Flow Rate (mL/min) 600 ml/min  Conductivity: Machine  14  HD Safety Checks Performed Yes  Dialysis Fluid Bolus Normal Saline  Bolus Amount (mL) 250 mL  Intra-Hemodialysis Comments Tx completed

## 2019-01-25 NOTE — Plan of Care (Signed)
Patient  is knowledgeable of information received , able to verbalize understanding . Emotional and  mental status improved . Voice no concerns around  safety. Thought process improving . Compliant  with medication given.  Able to verbalize feelings . Denies pain issues .  Problem: Education: Goal: Knowledge of [facility] Education information/materials will improve Outcome: Progressing Goal: Emotional status will improve Outcome: Progressing Goal: Mental status will improve Outcome: Progressing Goal: Verbalization of understanding the information provided will improve Outcome: Progressing   Problem: Safety: Goal: Periods of time without injury will increase Outcome: Progressing   Problem: Education: Goal: Utilization of techniques to improve thought processes will improve Outcome: Progressing Goal: Knowledge of the prescribed therapeutic regimen will improve Outcome: Progressing   Problem: Coping: Goal: Coping ability will improve Outcome: Progressing Goal: Will verbalize feelings Outcome: Progressing   Problem: Acute Pain Goal: Pain control Description Patient will demonstrate personal actions to control pain.   Outcome: Progressing

## 2019-01-25 NOTE — B[facility] Group Notes (Signed)
LCSW Group Therapy Note 01/25/2019 1:15pm  Type of Therapy and Topic: Group Therapy: Feelings Around Returning Home & Establishing a Supportive Framework and Supporting Oneself When Supports Not Available  Participation Level: Active  Description of Group:  Patients first processed thoughts and feelings about upcoming discharge. These included fears of upcoming changes, lack of change, new living environments, judgements and expectations from others and overall stigma of mental health issues. The group then discussed the definition of a supportive framework, what that looks and feels like, and how do to discern it from an unhealthy non-supportive network. The group identified different types of supports as well as what to do when your family/friends are less than helpful or unavailable  Therapeutic Goals  1. Patient will identify one healthy supportive network that they can use at discharge. 2. Patient will identify one factor of a supportive framework and how to tell it from an unhealthy network. 3. Patient able to identify one coping skill to use when they do not have positive supports from others. 4. Patient will demonstrate ability to communicate their needs through discussion and/or role plays.  Summary of Patient Progress:  The patient reported he feels "well."  Pt engaged during group session. As patients processed their anxiety about discharge and described healthy supports patient shared he is ready to be discharge.  Patients identified at least one self-care tool they were willing to use after discharge.   Therapeutic Modalities Cognitive Behavioral Therapy Motivational Interviewing   Shirely Toren  CUEBAS-COLON, LCSW 01/25/2019 9:30 AM

## 2019-01-25 NOTE — Progress Notes (Signed)
D- Patient alert and oriented. Mood improved today.. Denies SI, HI, AVH, and pain. Referred to his improved compliance with fluid restriction to prevent volume overload predialysis.. Maintain compliance with renal regimen.  A- Scheduled medications administered to patient, per MD orders. Support and encouragement provided.  Routine safety checks conducted every 15 minutes.  Patient informed to notify staff with problems or concerns. R- No adverse drug reactions noted. Patient contracts for safety at this time. Patient compliant with medications and treatment plan. Patient receptive to teaching about renal diet and treatment.. Patient interacts well with others on the unit.  Patient remains safe at this time.

## 2019-01-25 NOTE — Plan of Care (Signed)
Patient tolerated dialysis procedure without complications nor adverse reaction on 01/23/2019 and verbalized need for his compliance with renal diet and fluid restriction. Emotionally more active today and more satisfied with his ability to set and reach goals for his progress. Will continue to monitor and support progress. Problem: Education: Goal: Emotional status will improve Outcome: Progressing Goal: Mental status will improve Outcome: Progressing Goal: Verbalization of understanding the information provided will improve Outcome: Progressing   Problem: Safety: Goal: Periods of time without injury will increase Outcome: Progressing   Problem: Education: Goal: Utilization of techniques to improve thought processes will improve Outcome: Progressing   Problem: Coping: Goal: Coping ability will improve Outcome: Progressing   Problem: Acute Pain Goal: Pain control Description Patient will demonstrate personal actions to control pain.   Outcome: Progressing

## 2019-01-25 NOTE — Progress Notes (Signed)
D: Patient stated slept fair last night .Stated appetite fair and energy level s normal. Stated concentration is good . Stated on Depression scale 0 , hopeless 0 and anxiety 0 .( low 0-10 high) Denies suicidal  homicidal ideations  .  No auditory hallucinations  No pain concerns . Appropriate ADL'S. Interacting with peers and staff. Patient  is knowledgeable of information received , able to verbalize understanding . Emotional and  mental status improved . Voice no concerns around  safety. Thought process improving . Compliant  with medication given.  Able to verbalize feelings . Denies pain issues . Patient  Focus is going home . Patient unable to say where he could go , limited resources.  A: Encourage patient participation with unit programming . Instruction  Given on  Medication , verbalize understanding.  R: Voice no other concerns. Staff continue to monitor

## 2019-01-25 NOTE — Progress Notes (Signed)
[facility] MD Progress Note  01/25/2019 11:52 AM Brett Small  MRN:  [id number] Subjective:   Brett Small is a 43yo M with a h/o depressive disorder and a medical h/o ESRD on dialysis, HTN, who was admitted to inpatient psych unit due to depression.  Nursing notes reviewed. Patient has uneventful night. Vitals are wnl.  He reports feeling "fine", "good" mood. Denies any thoughts of harming self or others. No any mental health complaints today. He is compliant with medications. He says he thinks he is ready for discharge and can can stay at a motel, as he cannot travel to [place] (to stay with his ex-wife) due to dialysis and current covid-19 travel restrictions.   Principal Problem: Depression Diagnosis: Principal Problem:   Depression Active Problems:   End stage renal disease ([place])   Hypertension   Homelessness  Total Time spent with patient: 15 minutes  Past Psychiatric History: see H&P  Past Medical History:  Past Medical History:  Diagnosis Date  . Hypertension   . Renal disorder     Past Surgical History:  Procedure Laterality Date  . A/V SHUNT INTERVENTION Left 01/19/2019   Procedure: LEFT UPPER EXTREMITY A/V SHUNTOGRAM / UPPER EXTREMITY ANGIOGRAM;  Surgeon: Algernon Huxley, MD;  Location: [place] [facility];  Service: Cardiovascular;  Laterality: Left;  . AORTA - FEMORAL ARTERY BYPASS GRAFT    . AV FISTULA PLACEMENT Left 12/26/2018   Procedure: INSERTION OF GORE STRETCH VASCULAR 4-7MM X  45CM IN LEFT UPPER ARM;  Surgeon: Marty Heck, MD;  Location: [facility] OR;  Service: Vascular;  Laterality: Left;   Family History:  Family History  Problem Relation Age of Onset  . Hypertension Other   . Diabetes Other   . Clotting disorder Father    Family Psychiatric  History: see H&P Social History:  Social History   Substance and Sexual Activity  Alcohol Use Not Currently     Social History   Substance and Sexual Activity  Drug Use Never    Social History    Socioeconomic History  . Marital status: Single    Spouse name: Not on file  . Number of children: Not on file  . Years of education: Not on file  . Highest education level: Not on file  Occupational History  . Not on file  Social Needs  . Financial resource strain: Not on file  . Food insecurity:    Worry: Not on file    Inability: Not on file  . Transportation needs:    Medical: Not on file    Non-medical: Not on file  Tobacco Use  . Smoking status: Former Smoker    Last attempt to quit: 06/26/2018    Years since quitting: 0.5  . Smokeless tobacco: Never Used  . Tobacco comment: smoked for 30 years   Substance and Sexual Activity  . Alcohol use: Not Currently  . Drug use: Never  . Sexual activity: Not on file  Lifestyle  . Physical activity:    Days per week: Not on file    Minutes per session: Not on file  . Stress: Not on file  Relationships  . Social connections:    Talks on phone: Not on file    Gets together: Not on file    Attends religious service: Not on file    Active member of club or organization: Not on file    Attends meetings of clubs or organizations: Not on file    Relationship status: Not  on file  Other Topics Concern  . Not on file  Social History Narrative  . Not on file   Additional Social History:                         Sleep: Fair  Appetite:  Fair  Current Medications: Current Facility-Administered Medications  Medication Dose Route Frequency Provider Last Rate Last Dose  . 0.9 %  sodium chloride infusion  100 mL Intravenous PRN Algernon Huxley, MD      . 0.9 %  sodium chloride infusion  100 mL Intravenous PRN Algernon Huxley, MD      . acetaminophen (TYLENOL) tablet 650 mg  650 mg Oral Q6H PRN Algernon Huxley, MD   650 mg at 01/14/19 0219  . alteplase (CATHFLO ACTIVASE) injection 2 mg  2 mg Intracatheter Once PRN Algernon Huxley, MD      . amLODipine (NORVASC) tablet 5 mg  5 mg Oral Q2000 Murlean Iba, MD      . apixaban (ELIQUIS)  tablet 2.5 mg  2.5 mg Oral BID Clapacs, Madie Reno, MD   2.5 mg at 01/25/19 0756  . aspirin EC tablet 81 mg  81 mg Oral Daily Algernon Huxley, MD   81 mg at 01/25/19 0757  . atorvastatin (LIPITOR) tablet 80 mg  80 mg Oral QHS Algernon Huxley, MD   80 mg at 01/24/19 2140  . carvedilol (COREG) tablet 25 mg  25 mg Oral BID WC Murlean Iba, MD   25 mg at 01/25/19 0755  . Chlorhexidine Gluconate Cloth 2 % PADS 6 each  6 each Topical Q0600 Algernon Huxley, MD   6 each at 01/25/19 0544  . epoetin alfa (EPOGEN,PROCRIT) injection 10,000 Units  10,000 Units Intravenous Q T,Th,Sa-HD Algernon Huxley, MD   10,000 Units at 01/24/19 1237  . escitalopram (LEXAPRO) tablet 10 mg  10 mg Oral Daily Algernon Huxley, MD   10 mg at 01/25/19 0755  . gabapentin (NEURONTIN) capsule 100 mg  100 mg Oral TID Clapacs, John T, MD   100 mg at 01/25/19 1149  . heparin injection 1,000 Units  1,000 Units Dialysis PRN Algernon Huxley, MD      . heparin injection 2,000 Units  2,000 Units Dialysis PRN Algernon Huxley, MD      . hydrALAZINE (APRESOLINE) tablet 25 mg  25 mg Oral Q8H Murlean Iba, MD   25 mg at 01/25/19 0542  . HYDROmorphone (DILAUDID) injection 1 mg  1 mg Intravenous Once PRN Algernon Huxley, MD      . hydrOXYzine (ATARAX/VISTARIL) tablet 25 mg  25 mg Oral TID PRN Algernon Huxley, MD   25 mg at 01/24/19 2144  . ibuprofen (ADVIL,MOTRIN) tablet 400 mg  400 mg Oral Q6H PRN Algernon Huxley, MD   400 mg at 01/24/19 1806  . lidocaine (PF) (XYLOCAINE) 1 % injection 5 mL  5 mL Intradermal PRN Algernon Huxley, MD      . lidocaine-prilocaine (EMLA) cream 1 application  1 application Topical PRN Algernon Huxley, MD      . multivitamin (RENA-VIT) tablet 1 tablet  1 tablet Oral Daily Algernon Huxley, MD   1 tablet at 01/25/19 [phone]  . neomycin-bacitracin-polymyxin (NEOSPORIN) ointment   Topical BID Algernon Huxley, MD      . ondansetron [facility]) injection 4 mg  4 mg Intravenous Q6H PRN Algernon Huxley, MD      .  pentafluoroprop-tetrafluoroeth (GEBAUERS) aerosol 1 application  1  application Topical PRN Algernon Huxley, MD      . sevelamer carbonate (RENVELA) tablet 1,600 mg  1,600 mg Oral TID WC Algernon Huxley, MD   1,600 mg at 01/25/19 1148    Lab Results:  Results for orders placed or performed during the hospital encounter of 01/07/19 (from the past 48 hour(s))  Renal function panel     Status: Abnormal   Collection Time: 01/24/19 10:19 AM  Result Value Ref Range   Sodium 141 135 - 145 mmol/L   Potassium 3.7 3.5 - 5.1 mmol/L   Chloride 104 98 - 111 mmol/L   CO2 28 22 - 32 mmol/L   Glucose, Bld 134 (H) 70 - 99 mg/dL   BUN 30 (H) 6 - 20 mg/dL   Creatinine, Ser 5.15 (H) 0.61 - 1.24 mg/dL   Calcium 7.7 (L) 8.9 - 10.3 mg/dL   Phosphorus 2.5 2.5 - 4.6 mg/dL   Albumin 3.0 (L) 3.5 - 5.0 g/dL   GFR calc non Af Amer 12 (L) >60 mL/min   GFR calc Af Amer 14 (L) >60 mL/min   Anion gap 9 5 - 15    Comment: Performed at [facility], [address] [place],  [address]    Blood Alcohol level:  No results found for: [facility]  Metabolic Disorder Labs: Lab Results  Component Value Date   HGBA1C 5.5 01/08/2019   MPG 111.15 01/08/2019   No results found for: PROLACTIN Lab Results  Component Value Date   CHOL 101 01/08/2019   TRIG 156 (H) 01/08/2019   HDL 33 (L) 01/08/2019   CHOLHDL 3.1 01/08/2019   VLDL 31 01/08/2019   LDLCALC 37 01/08/2019    Physical Findings: AIMS:  , ,  ,  ,    CIWA:    COWS:     Musculoskeletal: Strength & Muscle Tone: within normal limits Gait & Station: normal Patient leans: N/A  Psychiatric Specialty Exam: Physical Exam  ROS  Blood pressure 140/86, pulse 85, temperature 97.9 F (36.6 C), temperature source Oral, resp. rate 18, height 5\' 1"  (1.549 m), weight 49.9 kg, SpO2 98 %.Body mass index is 20.78 kg/m.  General Appearance: Casual older stated age  Eye Contact:  Good  Speech:  Normal Rate  Volume:  Normal  Mood:  Euthymic  Affect:  Appropriate and Full Range  Thought Process:  Coherent, Goal Directed and  Linear  Orientation:  Full (Time, Place, and Person)  Thought Content:  Logical  Suicidal Thoughts:  No  Homicidal Thoughts:  No  Memory:  Immediate;   Fair  Judgement:  Fair  Insight:  Fair  Psychomotor Activity:  Normal  Concentration:  Concentration: Fair and Attention Span: Fair  Recall:  [employer] of Knowledge:  Fair  Language:  Fair  Akathisia:  No  Handed:  Right  AIMS (if indicated):     Assets:  Desire for Improvement  ADL's:  Intact  Cognition:  WNL  Sleep:  Number of Hours: 6     Treatment Plan Summary: Daily contact with patient to assess and evaluate symptoms and progress in treatment   43yo M with a h/o depressive disorder and a medical h/o ESRD on dialysis, HTN, who is in inpatient psych unit due to depression.  Today patient appears euthymic, he reports stable good mood, denies suicidal or homicidal thoughts. He denies any side effects from psych medications. Will continue current psych medications without changes. He  is asking about discharge and states he can stay at the motel. Disposition will be determined by a main treatment teem next week.  Impression: MDD, recurrent, moderate, without psychotic features.  Plan: -continue inpatient psych admission; 15-minute checks; daily contact with patient to assess and evaluate symptoms and progress in treatment.  -continueLexapro10mg  PO daily for depression andHydroxyzine 25mg  PO TID PRN anxiety;  -continue all other medications for comorbnid medical conditions;dyalysis x3/week.  -psychoeducation;  -encouragement to be involved in the unit;  -Disposition: to be determined.  Larita Fife, MD 01/25/2019, 11:52 AM

## 2019-01-26 LAB — CBC
HCT: 31.3 % — ABNORMAL LOW (ref 39.0–52.0)
Hemoglobin: 9.6 g/dL — ABNORMAL LOW (ref 13.0–17.0)
MCH: 30.3 pg (ref 26.0–34.0)
MCHC: 30.7 g/dL (ref 30.0–36.0)
MCV: 98.7 fL (ref 80.0–100.0)
Platelets: 222 10*3/uL (ref 150–400)
RBC: 3.17 MIL/uL — ABNORMAL LOW (ref 4.22–5.81)
RDW: 20.5 % — ABNORMAL HIGH (ref 11.5–15.5)
WBC: 6.9 10*3/uL (ref 4.0–10.5)
nRBC: 0 % (ref 0.0–0.2)

## 2019-01-26 LAB — HEPATITIS PANEL, ACUTE
HCV Ab: 0.1 s/co ratio (ref 0.0–0.9)
Hep A IgM: NEGATIVE
Hep B C IgM: NEGATIVE
Hepatitis B Surface Ag: NEGATIVE

## 2019-01-26 MED ORDER — ASPIRIN EC 81 MG PO TBEC: 81.0000 mg | DELAYED_RELEASE_TABLET | Freq: Every day | ORAL | 1 refills | Status: DC

## 2019-01-26 MED ORDER — SEVELAMER CARBONATE 800 MG PO TABS: 1600.0000 mg | ORAL_TABLET | Freq: Three times a day (TID) | ORAL | 1 refills | Status: DC

## 2019-01-26 MED ORDER — ESCITALOPRAM OXALATE 10 MG PO TABS: 10.0000 mg | ORAL_TABLET | Freq: Every day | ORAL | 1 refills | Status: DC

## 2019-01-26 MED ORDER — APIXABAN 2.5 MG PO TABS: 2.5000 mg | ORAL_TABLET | Freq: Two times a day (BID) | ORAL | 1 refills | Status: DC

## 2019-01-26 MED ORDER — CARVEDILOL 25 MG PO TABS: 25.0000 mg | ORAL_TABLET | Freq: Two times a day (BID) | ORAL | 1 refills | Status: AC

## 2019-01-26 MED ORDER — ATORVASTATIN CALCIUM 80 MG PO TABS: 80.0000 mg | ORAL_TABLET | Freq: Every day | ORAL | 1 refills | Status: DC

## 2019-01-26 MED ORDER — IBUPROFEN 400 MG PO TABS: 400.0000 mg | ORAL_TABLET | Freq: Four times a day (QID) | ORAL | 1 refills | Status: DC | PRN

## 2019-01-26 MED ORDER — HYDRALAZINE HCL 25 MG PO TABS: 25.0000 mg | ORAL_TABLET | Freq: Three times a day (TID) | ORAL | 1 refills | Status: DC

## 2019-01-26 MED ORDER — ALBUTEROL SULFATE (2.5 MG/3ML) 0.083% IN NEBU
2.5000 mg | INHALATION_SOLUTION | Freq: Four times a day (QID) | RESPIRATORY_TRACT | Status: DC | PRN
  Administered 2019-01-27 (×2): 2.5 mg via RESPIRATORY_TRACT
  Filled 2019-01-26 (×2): qty 3

## 2019-01-26 MED ORDER — AMLODIPINE BESYLATE 5 MG PO TABS: 5.0000 mg | ORAL_TABLET | Freq: Every day | ORAL | 1 refills | Status: DC

## 2019-01-26 MED ORDER — BACITRACIN-NEOMYCIN-POLYMYXIN OINTMENT TUBE: 1.0000 "application " | TOPICAL_OINTMENT | Freq: Two times a day (BID) | CUTANEOUS | 1 refills | Status: DC

## 2019-01-26 MED ORDER — RENA-VITE PO TABS: 1.0000 | ORAL_TABLET | Freq: Every day | ORAL | 1 refills | Status: DC

## 2019-01-26 MED ORDER — GABAPENTIN 100 MG PO CAPS: 100.0000 mg | ORAL_CAPSULE | Freq: Three times a day (TID) | ORAL | 1 refills | Status: DC

## 2019-01-26 NOTE — Progress Notes (Signed)
D: Patient stated slept fair last night .Stated appetite fair and energy level   normal. Stated concentration is good . Stated on Depression scale 0 , hopeless 0 and anxiety 4 .( low 0-10 high) Denies suicidal  homicidal ideations  .  No auditory hallucinations  No pain concerns . Appropriate ADL'S. Interacting with peers and staff. Denies pain issues .Patient  is knowledgeable of information received , able to verbalize understanding . Emotional and  mental status improved . Voice no concerns around  safety. Thought process improving . Compliant  with medication given.  Able to verbalize feelings . Dressing changed  Area noted puffey at site  No drainage  No redness . Patient aware of possible  Discharge tomorrow .  A: Encourage patient participation with unit programming . Instruction  Given on  Medication , verbalize understanding.  R: Voice no other concerns. Staff continue to monitor

## 2019-01-26 NOTE — BHH Counselor (Signed)
CSW spoke with Estill Bamberg, the Dialysis Coordinator who wanted an update on the patients plan.  CSW informed that the patient plans to go to the [facility] on [address] and possible [place] [facility] or ride the bus to the [facility].  CSW reported that she had previously mapquested the distance and it is a 7 min car ride for the patient to be able to get to his dialysis appointment. CSW also shared that the per the admissions coordinator that pt and CSW spoke with the admissions coordinator would call back with "cleareance and chair time" for the patient to begin dialysis on 01/27/2019.  [name], MSW, LCSW 01/26/2019 1:04 PM

## 2019-01-26 NOTE — BHH Group Notes (Signed)
Overcoming Obstacles  01/26/2019 1PM  Type of Therapy and Topic:  Group Therapy:  Overcoming Obstacles  Participation Level:  Minimal    Description of Group:    In this group patients will be encouraged to explore what they see as obstacles to their own wellness and recovery. They will be guided to discuss their thoughts, feelings, and behaviors related to these obstacles. The group will process together ways to cope with barriers, with attention given to specific choices patients can make. Each patient will be challenged to identify changes they are motivated to make in order to overcome their obstacles. This group will be process-oriented, with patients participating in exploration of their own experiences as well as giving and receiving support and challenge from other group members.   Therapeutic Goals: 1. Patient will identify personal and current obstacles as they relate to admission. 2. Patient will identify barriers that currently interfere with their wellness or overcoming obstacles.  3. Patient will identify feelings, thought process and behaviors related to these barriers. 4. Patient will identify two changes they are willing to make to overcome these obstacles:      Summary of Patient Progress Minimal participation during group. Pt colored most of the time while in group, very little input provided.    Therapeutic Modalities:   Cognitive Behavioral Therapy Solution Focused Therapy Motivational Interviewing Relapse Prevention Therapy    Sanjuana Kava, MSW, LCSW 01/26/2019 1:49 PM

## 2019-01-26 NOTE — BHH Counselor (Signed)
CSW received a return call from [facility] at [place].  Ivin Booty requested an address and CSW provided and informed that demographic information has been provided to [facility] during admissions screening.  Ivin Booty asked for patient records and CSW informed that Estill Bamberg was the Dialysis Coordinator and best person to support with those records.  CSW provided Amanda's contact information.   [name], MSW, LCSW 01/26/2019 2:01 PM

## 2019-01-26 NOTE — BHH Counselor (Signed)
CSW and pt called the [facility], Admissions Dept [phone], to continue the admissions process.  CSW provided pt with access to phone and patient provided requested demographic information including name and DOB.  CSW provided, with pt permission his insurance information and address to the hotel that the patient will be residing at.  CSW provided the name and number of the Patient Pathway Director, Estill Bamberg [phone], as given by the travel nurse who was in the dialysis center provided.    [name], MSW, LCSW 01/26/2019 11:00 AM

## 2019-01-26 NOTE — Progress Notes (Signed)
Recreation Therapy Notes  Date: 01/26/2019  Time: 9:30 am  Location: Craft Room  Behavioral response: Appropriate  Intervention Topic: Self-esteem  Discussion/Intervention:  Group content today was focused on self-esteem. Patient defined self-esteem and where it comes form. The group described reasons self-esteem is important. Individuals stated things that impact self-esteem and positive ways to improve self-esteem. The group participated in the intervention "Collage of Me" where patients were able to create a collage of positive things that makes them who they are. Clinical Observations/Feedback:  Patient came to group late and was focused on what peers and staff had to say about self-esteem. He defined self-esteem as how you think others feel about you. Participant explained that social media has little impact on his self-esteem. Individual was social with peers and staff while participating in the intervention.  Jamion Carter LRT/CTRS         Todd Jelinski 01/26/2019 11:11 AM

## 2019-01-26 NOTE — BHH Counselor (Signed)
CSW spoke with [facility], with the patient present in an effort to establish the patient with them, they requested information that this CSW does not have access to.  CSW spoke with the patients nurse, Meredith Mody, who reports that she does not have access to the information as well.  CSW will follow up with Dr. Weber Cooks and all the center back.  [name], MSW, LCSW 01/26/2019 9:45 AM

## 2019-01-26 NOTE — Plan of Care (Signed)
D: Patient has been calm and cooperative. Mood is depressed. Affect is appropriate to circumstance. Denies SI, HI and AV hallucinations. Requested and received vistaril 25 mg for anxiety with relief. Contracts for safety. A: Continue to monitor and offer support. R: Safety maintained.

## 2019-01-26 NOTE — Plan of Care (Signed)
Denies pain issues .Patient  is knowledgeable of information received , able to verbalize understanding . Emotional and  mental status improved . Voice no concerns around  safety. Thought process improving . Compliant  with medication given.  Able to verbalize feelings .  Problem: Education: Goal: Knowledge of [place] [facility] Education information/materials will improve Outcome: Progressing Goal: Emotional status will improve Outcome: Progressing Goal: Mental status will improve Outcome: Progressing Goal: Verbalization of understanding the information provided will improve Outcome: Progressing   Problem: Safety: Goal: Periods of time without injury will increase Outcome: Progressing   Problem: Education: Goal: Utilization of techniques to improve thought processes will improve Outcome: Progressing Goal: Knowledge of the prescribed therapeutic regimen will improve Outcome: Progressing   Problem: Coping: Goal: Coping ability will improve Outcome: Progressing Goal: Will verbalize feelings Outcome: Progressing   Problem: Acute Pain Goal: Pain control Description Patient will demonstrate personal actions to control pain.   Outcome: Progressing

## 2019-01-26 NOTE — Discharge Summary (Signed)
Physician Discharge Summary Note  Patient:  Brett Small is an 43 y.o., male MRN:  [id number] DOB:  1976/10/18 Patient phone:  [phone] (home)  Patient address:   [place] [address],  Total Time spent with patient: 1 hour  Date of Admission:  01/07/2019 Date of Discharge: January 26, 2019  Reason for Admission: Patient was admitted in transfer from [facility] because of reported suicidal ideation and homelessness.  Principal Problem: Depression Discharge Diagnoses: Principal Problem:   Depression Active Problems:   End stage renal disease ([place])   Hypertension   Homelessness   Past Psychiatric History: No significant past psychiatric history  Past Medical History:  Past Medical History:  Diagnosis Date  . Hypertension   . Renal disorder     Past Surgical History:  Procedure Laterality Date  . A/V SHUNT INTERVENTION Left 01/19/2019   Procedure: LEFT UPPER EXTREMITY A/V SHUNTOGRAM / UPPER EXTREMITY ANGIOGRAM;  Surgeon: Algernon Huxley, MD;  Location: [place] [facility];  Service: Cardiovascular;  Laterality: Left;  . AORTA - FEMORAL ARTERY BYPASS GRAFT    . AV FISTULA PLACEMENT Left 12/26/2018   Procedure: INSERTION OF GORE STRETCH VASCULAR 4-7MM X  45CM IN LEFT UPPER ARM;  Surgeon: Marty Heck, MD;  Location: [facility];  Service: Vascular;  Laterality: Left;   Family History:  Family History  Problem Relation Age of Onset  . Hypertension Other   . Diabetes Other   . Clotting disorder Father    Family Psychiatric  History: None noted Social History:  Social History   Substance and Sexual Activity  Alcohol Use Not Currently     Social History   Substance and Sexual Activity  Drug Use Never    Social History   Socioeconomic History  . Marital status: Single    Spouse name: Not on file  . Number of children: Not on file  . Years of education: Not on file  . Highest education level: Not on file  Occupational History  . Not on file   Social Needs  . Financial resource strain: Not on file  . Food insecurity:    Worry: Not on file    Inability: Not on file  . Transportation needs:    Medical: Not on file    Non-medical: Not on file  Tobacco Use  . Smoking status: Former Smoker    Last attempt to quit: 06/26/2018    Years since quitting: 0.5  . Smokeless tobacco: Never Used  . Tobacco comment: smoked for 30 years   Substance and Sexual Activity  . Alcohol use: Not Currently  . Drug use: Never  . Sexual activity: Not on file  Lifestyle  . Physical activity:    Days per week: Not on file    Minutes per session: Not on file  . Stress: Not on file  Relationships  . Social connections:    Talks on phone: Not on file    Gets together: Not on file    Attends religious service: Not on file    Active member of club or organization: Not on file    Attends meetings of clubs or organizations: Not on file    Relationship status: Not on file  Other Topics Concern  . Not on file  Social History Narrative  . Not on file    Hospital Course: Patient was transferred to the psychiatric ward on the grounds that he had reportedly been voicing suicidal ideation.  Since coming to our unit  patient has denied any suicidal ideation.  He has not shown any dangerous behavior.  Mood was mildly depressed mostly related to his frustration about his chronic illness.  Here on the psychiatric ward we have continued to make sure he gets his dialysis treatment and his regular medicines as prescribed.  He has been continued on Lexapro and tolerated that without difficulty.  Patient has consistently denied any symptoms of depression for many days.  There was a great deal of difficulty finding an appropriate disposition for this patient who has no support at all locally and had no money and no income and needed to get dialysis as well as have a safe place to live.  Current coronavirus lockdown has meant that shelters were not taking new patients and  neither our group homes.  Ultimately the patient has this checked for this month and is agreeable to staying in a rented boardinghouse or hotel room in [place].  We are finalizing plans for him to have dialysis treatment in place.  Prescriptions are all to be provided at discharge.  Patient at this point no longer meets any commitment criteria and does not show any sign of dangerousness.  He is lucid and able to make appropriate decisions for himself.  During his time in the hospital he did have to have a procedure having his fistula worked on.  That seems to have gone well  Physical Findings: AIMS:  , ,  ,  ,    CIWA:    COWS:     Musculoskeletal: Strength & Muscle Tone: within normal limits Gait & Station: normal Patient leans: N/A  Psychiatric Specialty Exam: Physical Exam  Nursing note and vitals reviewed. Constitutional: He appears well-developed and well-nourished.  HENT:  Head: Normocephalic and atraumatic.  Eyes: Pupils are equal, round, and reactive to light. Conjunctivae are normal.  Neck: Normal range of motion.  Cardiovascular: Regular rhythm and normal heart sounds.  Respiratory: Effort normal.  GI: Soft.  Musculoskeletal: Normal range of motion.  Neurological: He is alert.  Skin: Skin is warm and dry.  Psychiatric: He has a normal mood and affect. His behavior is normal. Judgment and thought content normal.    Review of Systems  Constitutional: Negative.   HENT: Negative.   Eyes: Negative.   Respiratory: Negative.   Cardiovascular: Negative.   Gastrointestinal: Negative.   Musculoskeletal: Negative.   Skin: Negative.   Neurological: Negative.   Psychiatric/Behavioral: Negative.     Blood pressure 136/87, pulse (!) 104, temperature 98.2 F (36.8 C), temperature source Oral, resp. rate 18, height 5\' 1"  (1.549 m), weight 49.9 kg, SpO2 95 %.Body mass index is 20.78 kg/m.  General Appearance: Fairly Groomed  Eye Contact:  Good  Speech:  Clear and Coherent   Volume:  Normal  Mood:  Euthymic  Affect:  Congruent  Thought Process:  Goal Directed  Orientation:  Full (Time, Place, and Person)  Thought Content:  Logical  Suicidal Thoughts:  No  Homicidal Thoughts:  No  Memory:  Immediate;   Fair Recent;   Fair Remote;   Fair  Judgement:  Fair  Insight:  Fair  Psychomotor Activity:  Normal  Concentration:  Concentration: Fair  Recall:  [employer] of Knowledge:  Fair  Language:  Fair  Akathisia:  No  Handed:  Right  AIMS (if indicated):     Assets:  Communication Skills Desire for Improvement Financial Resources/Insurance Resilience  ADL's:  Intact  Cognition:  WNL  Sleep:  Number of Hours:  6.5     Have you used any form of tobacco in the last 30 days? (Cigarettes, Smokeless Tobacco, Cigars, and/or Pipes): No  Has this patient used any form of tobacco in the last 30 days? (Cigarettes, Smokeless Tobacco, Cigars, and/or Pipes) Yes, Yes, A prescription for an FDA-approved tobacco cessation medication was offered at discharge and the patient refused  Blood Alcohol level:  No results found for: [facility]  Metabolic Disorder Labs:  Lab Results  Component Value Date   HGBA1C 5.5 01/08/2019   MPG 111.15 01/08/2019   No results found for: PROLACTIN Lab Results  Component Value Date   CHOL 101 01/08/2019   TRIG 156 (H) 01/08/2019   HDL 33 (L) 01/08/2019   CHOLHDL 3.1 01/08/2019   VLDL 31 01/08/2019   LDLCALC 37 01/08/2019    See Psychiatric Specialty Exam and Suicide Risk Assessment completed by Attending Physician prior to discharge.  Discharge destination:  Home  Is patient on multiple antipsychotic therapies at discharge:  No   Has Patient had three or more failed trials of antipsychotic monotherapy by history:  No  Recommended Plan for Multiple Antipsychotic Therapies: NA  Discharge Instructions    Diet - low sodium heart healthy   Complete by:  As directed    Increase activity slowly   Complete by:  As directed       Allergies as of 01/26/2019      Reactions   Codeine Nausea Only   Patient questioned this (??)   Sulfa Antibiotics Hives, Nausea And Vomiting      Medication List    STOP taking these medications   acetaminophen 325 MG tablet Commonly known as:  TYLENOL   hydrOXYzine 25 MG tablet Commonly known as:  ATARAX/VISTARIL     TAKE these medications     Indication  amLODipine 5 MG tablet Commonly known as:  NORVASC Take 1 tablet (5 mg total) by mouth daily at 8 pm. What changed:    when to take this  additional instructions  Indication:  High Blood Pressure Disorder   apixaban 2.5 MG Tabs tablet Commonly known as:  ELIQUIS Take 1 tablet (2.5 mg total) by mouth 2 (two) times daily.  Indication:  Blood Clot in a Deep Vein   aspirin EC 81 MG tablet Take 1 tablet (81 mg total) by mouth daily.  Indication:  Coronary Bypass Surgery   atorvastatin 80 MG tablet Commonly known as:  LIPITOR Take 1 tablet (80 mg total) by mouth at bedtime.  Indication:  High Amount of Fats in the Blood   carvedilol 25 MG tablet Commonly known as:  COREG Take 1 tablet (25 mg total) by mouth 2 (two) times daily with a meal.  Indication:  High Blood Pressure Disorder   escitalopram 10 MG tablet Commonly known as:  LEXAPRO Take 1 tablet (10 mg total) by mouth daily.  Indication:  Major Depressive Disorder   gabapentin 100 MG capsule Commonly known as:  NEURONTIN Take 1 capsule (100 mg total) by mouth 3 (three) times daily. What changed:    medication strength  how much to take  Indication:  Diabetes with Nerve Disease   hydrALAZINE 25 MG tablet Commonly known as:  APRESOLINE Take 1 tablet (25 mg total) by mouth every 8 (eight) hours.  Indication:  High Blood Pressure Disorder   ibuprofen 400 MG tablet Commonly known as:  ADVIL,MOTRIN Take 1 tablet (400 mg total) by mouth every 6 (six) hours as needed for moderate pain.  Indication:  Mild to Moderate Pain   multivitamin Tabs  tablet Take 1 tablet by mouth daily. Start taking on:  January 27, 2019  Indication:  Vitamin Deficiency   neomycin-bacitracin-polymyxin Oint Commonly known as:  NEOSPORIN Apply 1 application topically 2 (two) times daily.  Indication:  Wound Care   sevelamer carbonate 800 MG tablet Commonly known as:  RENVELA Take 2 tablets (1,600 mg total) by mouth 3 (three) times daily with meals.  Indication:  High Amount of Phosphate in the Blood        Follow-up recommendations:  Activity:  Activity as tolerated Diet:  Renal diet Other:  Follow-up with outpatient treatment mainly for his medication needs and dialysis needs  Comments: Patient's mental health issues are relatively minor compared to his medical needs.  Prescription given for the antidepressant.  Strongly encourage patient to get his life stable and make plans for his chronic medical issues.  Signed: Alethia Berthold, MD 01/26/2019, 3:06 PM

## 2019-01-26 NOTE — Progress Notes (Signed)
D: Patient has been calm and cooperative. Mood is depressed. Affect is appropriate to circumstance. Denies SI, HI and AV hallucinations. Requested and received vistaril 25 mg for anxiety with relief. Contracts for safety. A: Continue to monitor and offer support. R: Safety maintained.

## 2019-01-26 NOTE — BHH Suicide Risk Assessment (Signed)
[facility] Discharge Suicide Risk Assessment   Principal Problem: Depression Discharge Diagnoses: Principal Problem:   Depression Active Problems:   End stage renal disease ([place])   Hypertension   Homelessness   Total Time spent with patient: 45 minutes  Musculoskeletal: Strength & Muscle Tone: within normal limits Gait & Station: normal Patient leans: N/A  Psychiatric Specialty Exam: Review of Systems  Constitutional: Negative.   HENT: Negative.   Eyes: Negative.   Respiratory: Negative.   Cardiovascular: Negative.   Gastrointestinal: Negative.   Musculoskeletal: Negative.   Skin: Negative.   Neurological: Negative.   Psychiatric/Behavioral: Negative.     Blood pressure 136/87, pulse (!) 104, temperature 98.2 F (36.8 C), temperature source Oral, resp. rate 18, height 5\' 1"  (1.549 m), weight 49.9 kg, SpO2 95 %.Body mass index is 20.78 kg/m.  General Appearance: Fairly Groomed  Engineer, water::  Good  Speech:  [phone]  Volume:  Normal  Mood:  Euthymic  Affect:  Congruent  Thought Process:  Goal Directed  Orientation:  Full (Time, Place, and Person)  Thought Content:  Logical  Suicidal Thoughts:  No  Homicidal Thoughts:  No  Memory:  Immediate;   Fair Recent;   Fair Remote;   Fair  Judgement:  Fair  Insight:  Fair  Psychomotor Activity:  Normal  Concentration:  Fair  Recall:  [employer] of Knowledge:Fair  Language: Fair  Akathisia:  No  Handed:  Right  AIMS (if indicated):     Assets:  Desire for Improvement Resilience  Sleep:  Number of Hours: 6.5  Cognition: WNL  ADL's:  Intact   Mental Status Per Nursing Assessment::   On Admission:  NA  Demographic Factors:  Male and Living alone  Loss Factors: Decline in physical health  Historical Factors: Impulsivity  Risk Reduction Factors:   NA  Continued Clinical Symptoms:  Depression:   Impulsivity  Cognitive Features That Contribute To Risk:  Thought constriction (tunnel vision)    Suicide Risk:   Minimal: No identifiable suicidal ideation.  Patients presenting with no risk factors but with morbid ruminations; may be classified as minimal risk based on the severity of the depressive symptoms    Plan Of Care/Follow-up recommendations:  Activity:  Activity as tolerated.  Make sure you are living safely and follow-up with dialysis Diet:  Regular or renal diet and try to control fluid intake Other:  Follow-up with dialysis in [place].  Continue current medicine.  Follow-up with local medical provider  Alethia Berthold, MD 01/26/2019, 2:20 PM

## 2019-01-27 ENCOUNTER — Other Ambulatory Visit: Payer: Self-pay

## 2019-01-27 ENCOUNTER — Inpatient Hospital Stay
Admission: AD | Admit: 2019-01-27 | Discharge: 2019-01-29 | DRG: 640 | Disposition: A | Discharge status: Home / Self Care | Payer: Medicare Other | Source: Ambulatory Visit | Attending: Internal Medicine | Admitting: Internal Medicine

## 2019-01-27 ENCOUNTER — Inpatient Hospital Stay: Payer: Medicare Other

## 2019-01-27 ENCOUNTER — Inpatient Hospital Stay (HOSPITAL_COMMUNITY)
Admission: AD | Admit: 2019-01-27 | Discharge: 2019-01-27 | Disposition: A | Discharge status: Home / Self Care | Payer: Medicare Other | Source: Ambulatory Visit | Attending: Pulmonary Disease | Admitting: Pulmonary Disease

## 2019-01-27 DIAGNOSIS — Z7901 Long term (current) use of anticoagulants: Secondary | ICD-10-CM | POA: Diagnosis not present

## 2019-01-27 DIAGNOSIS — I361 Nonrheumatic tricuspid (valve) insufficiency: Secondary | ICD-10-CM

## 2019-01-27 DIAGNOSIS — E8770 Fluid overload, unspecified: Secondary | ICD-10-CM | POA: Diagnosis present

## 2019-01-27 DIAGNOSIS — D631 Anemia in chronic kidney disease: Secondary | ICD-10-CM | POA: Diagnosis present

## 2019-01-27 DIAGNOSIS — I340 Nonrheumatic mitral (valve) insufficiency: Secondary | ICD-10-CM

## 2019-01-27 DIAGNOSIS — R45851 Suicidal ideations: Secondary | ICD-10-CM | POA: Diagnosis present

## 2019-01-27 DIAGNOSIS — Z8249 Family history of ischemic heart disease and other diseases of the circulatory system: Secondary | ICD-10-CM

## 2019-01-27 DIAGNOSIS — Z87891 Personal history of nicotine dependence: Secondary | ICD-10-CM | POA: Diagnosis not present

## 2019-01-27 DIAGNOSIS — F329 Major depressive disorder, single episode, unspecified: Secondary | ICD-10-CM | POA: Diagnosis present

## 2019-01-27 DIAGNOSIS — R059 Cough, unspecified: Secondary | ICD-10-CM

## 2019-01-27 DIAGNOSIS — Z833 Family history of diabetes mellitus: Secondary | ICD-10-CM | POA: Diagnosis not present

## 2019-01-27 DIAGNOSIS — Z992 Dependence on renal dialysis: Secondary | ICD-10-CM

## 2019-01-27 DIAGNOSIS — Z885 Allergy status to narcotic agent status: Secondary | ICD-10-CM

## 2019-01-27 DIAGNOSIS — N2581 Secondary hyperparathyroidism of renal origin: Secondary | ICD-10-CM | POA: Diagnosis present

## 2019-01-27 DIAGNOSIS — I120 Hypertensive chronic kidney disease with stage 5 chronic kidney disease or end stage renal disease: Secondary | ICD-10-CM | POA: Diagnosis present

## 2019-01-27 DIAGNOSIS — N186 End stage renal disease: Secondary | ICD-10-CM

## 2019-01-27 DIAGNOSIS — J810 Acute pulmonary edema: Secondary | ICD-10-CM

## 2019-01-27 DIAGNOSIS — R05 Cough: Secondary | ICD-10-CM

## 2019-01-27 DIAGNOSIS — Z882 Allergy status to sulfonamides status: Secondary | ICD-10-CM | POA: Diagnosis not present

## 2019-01-27 DIAGNOSIS — Z7982 Long term (current) use of aspirin: Secondary | ICD-10-CM

## 2019-01-27 DIAGNOSIS — Z590 Homelessness: Secondary | ICD-10-CM

## 2019-01-27 DIAGNOSIS — R0602 Shortness of breath: Secondary | ICD-10-CM | POA: Diagnosis present

## 2019-01-27 DIAGNOSIS — J9601 Acute respiratory failure with hypoxia: Secondary | ICD-10-CM

## 2019-01-27 DIAGNOSIS — Z832 Family history of diseases of the blood and blood-forming organs and certain disorders involving the immune mechanism: Secondary | ICD-10-CM | POA: Diagnosis not present

## 2019-01-27 LAB — CBC
HCT: 35 % — ABNORMAL LOW (ref 39.0–52.0)
Hemoglobin: 10.6 g/dL — ABNORMAL LOW (ref 13.0–17.0)
MCH: 30.2 pg (ref 26.0–34.0)
MCHC: 30.3 g/dL (ref 30.0–36.0)
MCV: 99.7 fL (ref 80.0–100.0)
Platelets: 232 10*3/uL (ref 150–400)
RBC: 3.51 MIL/uL — ABNORMAL LOW (ref 4.22–5.81)
RDW: 20.4 % — ABNORMAL HIGH (ref 11.5–15.5)
WBC: 13.2 10*3/uL — ABNORMAL HIGH (ref 4.0–10.5)
nRBC: 0 % (ref 0.0–0.2)

## 2019-01-27 LAB — TSH: TSH: 10.035 u[IU]/mL — ABNORMAL HIGH (ref 0.350–4.500)

## 2019-01-27 LAB — COMPREHENSIVE METABOLIC PANEL
ALT: 10 U/L (ref 0–44)
AST: 17 U/L (ref 15–41)
Albumin: 3.4 g/dL — ABNORMAL LOW (ref 3.5–5.0)
Alkaline Phosphatase: 71 U/L (ref 38–126)
Anion gap: 13 (ref 5–15)
BUN: 59 mg/dL — ABNORMAL HIGH (ref 6–20)
CO2: 25 mmol/L (ref 22–32)
Calcium: 8.1 mg/dL — ABNORMAL LOW (ref 8.9–10.3)
Chloride: 106 mmol/L (ref 98–111)
Creatinine, Ser: 8.39 mg/dL — ABNORMAL HIGH (ref 0.61–1.24)
GFR calc Af Amer: 8 mL/min — ABNORMAL LOW (ref >60)
GFR calc non Af Amer: 7 mL/min — ABNORMAL LOW (ref >60)
Glucose, Bld: 158 mg/dL — ABNORMAL HIGH (ref 70–99)
Potassium: 5.8 mmol/L — ABNORMAL HIGH (ref 3.5–5.1)
Sodium: 144 mmol/L (ref 135–145)
Total Bilirubin: 0.7 mg/dL (ref 0.3–1.2)
Total Protein: 6.9 g/dL (ref 6.5–8.1)

## 2019-01-27 LAB — ECHOCARDIOGRAM COMPLETE
Height: 61 in
Weight: 1770.73 oz

## 2019-01-27 LAB — MRSA PCR SCREENING: MRSA by PCR: POSITIVE — AB

## 2019-01-27 LAB — PHOSPHORUS: Phosphorus: 7.1 mg/dL — ABNORMAL HIGH (ref 2.5–4.6)

## 2019-01-27 LAB — GLUCOSE, CAPILLARY: Glucose-Capillary: 181 mg/dL — ABNORMAL HIGH (ref 70–99)

## 2019-01-27 LAB — TROPONIN I: Troponin I: <0.03 ng/mL (ref <0.03)

## 2019-01-27 MED ORDER — FUROSEMIDE 10 MG/ML IJ SOLN
60.0000 mg | Freq: Once | INTRAMUSCULAR | Status: AC
  Administered 2019-01-27: 08:00:00 60 mg via INTRAVENOUS
  Filled 2019-01-27: qty 6

## 2019-01-27 MED ORDER — APIXABAN 2.5 MG PO TABS
2.5000 mg | ORAL_TABLET | Freq: Two times a day (BID) | ORAL | Status: DC
  Administered 2019-01-27 – 2019-01-28 (×3): 2.5 mg via ORAL
  Filled 2019-01-27 (×3): qty 1

## 2019-01-27 MED ORDER — HYDRALAZINE HCL 20 MG/ML IJ SOLN
10.0000 mg | Freq: Once | INTRAMUSCULAR | Status: AC
  Administered 2019-01-27: 08:00:00 10 mg via INTRAVENOUS
  Filled 2019-01-27: qty 1

## 2019-01-27 MED ORDER — ONDANSETRON HCL 4 MG/2ML IJ SOLN
4.0000 mg | Freq: Four times a day (QID) | INTRAMUSCULAR | Status: DC | PRN
  Administered 2019-01-27: 10:00:00 4 mg via INTRAVENOUS
  Filled 2019-01-27: qty 2

## 2019-01-27 MED ORDER — DOCUSATE SODIUM 100 MG PO CAPS
100.0000 mg | ORAL_CAPSULE | Freq: Two times a day (BID) | ORAL | Status: DC
  Administered 2019-01-27 – 2019-01-28 (×3): 100 mg via ORAL
  Filled 2019-01-27 (×3): qty 1

## 2019-01-27 MED ORDER — ESCITALOPRAM OXALATE 10 MG PO TABS
10.0000 mg | ORAL_TABLET | Freq: Every day | ORAL | Status: DC
  Administered 2019-01-27 – 2019-01-29 (×3): 10 mg via ORAL
  Filled 2019-01-27 (×3): qty 1

## 2019-01-27 MED ORDER — LORAZEPAM 1 MG PO TABS
1.0000 mg | ORAL_TABLET | Freq: Once | ORAL | Status: AC
  Administered 2019-01-27: 1 mg via ORAL
  Filled 2019-01-27: qty 1

## 2019-01-27 MED ORDER — HEPARIN SODIUM (PORCINE) 5000 UNIT/ML IJ SOLN
5000.0000 [IU] | Freq: Three times a day (TID) | INTRAMUSCULAR | Status: DC
  Administered 2019-01-27: 5000 [IU] via SUBCUTANEOUS
  Filled 2019-01-27: qty 1

## 2019-01-27 MED ORDER — TRAMADOL HCL 50 MG PO TABS
50.0000 mg | ORAL_TABLET | Freq: Four times a day (QID) | ORAL | Status: DC | PRN
  Administered 2019-01-27: 50 mg via ORAL
  Filled 2019-01-27: qty 1

## 2019-01-27 MED ORDER — EPOETIN ALFA 10000 UNIT/ML IJ SOLN
4000.0000 [IU] | INTRAMUSCULAR | Status: DC
  Administered 2019-01-27 – 2019-01-29 (×2): 4000 [IU] via INTRAVENOUS

## 2019-01-27 MED ORDER — ONDANSETRON HCL 4 MG PO TABS: 4.0000 mg | ORAL_TABLET | Freq: Four times a day (QID) | ORAL | Status: DC | PRN

## 2019-01-27 MED ORDER — ACETAMINOPHEN 325 MG PO TABS
650.0000 mg | ORAL_TABLET | Freq: Four times a day (QID) | ORAL | Status: DC | PRN
  Administered 2019-01-27 – 2019-01-29 (×2): 650 mg via ORAL
  Filled 2019-01-27 (×2): qty 2

## 2019-01-27 MED ORDER — ORAL CARE MOUTH RINSE
15.0000 mL | Freq: Two times a day (BID) | OROMUCOSAL | Status: DC
  Administered 2019-01-27: 15 mL via OROMUCOSAL

## 2019-01-27 MED ORDER — NITROGLYCERIN 2 % TD OINT
0.5000 [in_us] | TOPICAL_OINTMENT | Freq: Four times a day (QID) | TRANSDERMAL | Status: DC
  Administered 2019-01-27 – 2019-01-29 (×9): 0.5 [in_us] via TOPICAL
  Filled 2019-01-27 (×10): qty 1

## 2019-01-27 MED ORDER — FUROSEMIDE 40 MG PO TABS
40.0000 mg | ORAL_TABLET | ORAL | Status: DC
  Administered 2019-01-28: 10:00:00 40 mg via ORAL
  Filled 2019-01-27: qty 1

## 2019-01-27 MED ORDER — ORAL CARE MOUTH RINSE
15.0000 mL | Freq: Two times a day (BID) | OROMUCOSAL | Status: DC
  Administered 2019-01-27 – 2019-01-29 (×3): 15 mL via OROMUCOSAL

## 2019-01-27 MED ORDER — CHLORHEXIDINE GLUCONATE 0.12 % MT SOLN
15.0000 mL | Freq: Two times a day (BID) | OROMUCOSAL | Status: DC
  Administered 2019-01-27: 15 mL via OROMUCOSAL
  Filled 2019-01-27: qty 15

## 2019-01-27 MED ORDER — ACETAMINOPHEN 650 MG RE SUPP: 650.0000 mg | Freq: Four times a day (QID) | RECTAL | Status: DC | PRN

## 2019-01-27 NOTE — Progress Notes (Signed)
Post HD Tx, uf goal adjusted during tx for cramping.   01/27/19 1215  Hand-Off documentation  Report given to (Full Name) Tildon Husky, RN   Report received from (Full Name) Beatris Ship, RN   Vital Signs  Temp 97.8 F (36.6 C)  Temp Source Oral  Pulse Rate (!) 111  Pulse Rate Source Monitor  Resp 18  BP 133/85  BP Location Right Arm  BP Method Automatic  Patient Position (if appropriate) Lying  Oxygen Therapy  SpO2 93 %  O2 Device Nasal Cannula  Pulse Oximetry Type Continuous  Pain Assessment  Pain Scale 0-10  Pain Score 0  Dialysis Weight  Weight 50.2 kg  Type of Weight Post-Dialysis  Post-Hemodialysis Assessment  Rinseback Volume (mL) 250 mL  KECN 69.9 V  Dialyzer Clearance Lightly streaked  Duration of HD Treatment -hour(s) 3.5 hour(s)  Hemodialysis Intake (mL) 500 mL  UF Total -Machine (mL) 1729 mL  Net UF (mL) 1229 mL  Tolerated HD Treatment Yes  Hemodialysis Catheter Right Subclavian  No Placement Date or Time found.   Placed prior to admission: Yes  Orientation: Right  Access Location: Subclavian  Post treatment catheter status Capped and Clamped

## 2019-01-27 NOTE — BH Specialist Note (Cosign Needed)
[name] is a 43yo M with a h/o depressive disorder and a medical h/o ESRD on dialysis, HTN, who was admitted to inpatient psych unit due to depression.  Nursing notes and past provider notes were reviewed.  This provider received a call from the RN on the inpatient unit stating that the patient Brett Small) were experiencing increased anxiety and shortness of breath.  She did discuss that the patient is scheduled to have dialysis on January 27, 2019 and believe he was exhibiting symptoms of fluid overload.  Order was placed for this patient to have Ativan 1 mg by mouth x1 now and Albuterol (Proventil 2.5mg /36ml) every 6 hours as needed for shortness of breath.

## 2019-01-27 NOTE — H&P (Signed)
Brett Small is an 43 y.o. male.   Chief Complaint: Shortness of breath HPI: The patient with past medical history of hypertension and ESRD on dialysis is admitted to the hospitalist from the behavioral health unit due to respiratory failure.  The patient's vital signs have been stable throughout the night when he was found to be in respiratory distress.  Oxygen saturations were in the low 80s and the patient had increased work of breathing.  He denies chest pain.  A nonrebreather mask was applied and the patient's oxygen saturations improved to 94%.  As the behavioral health unit does not have facilities and nursing ratio for acute care the patient was transferred to the hospitalist service for increased level of care.  Past Medical History:  Diagnosis Date  . Hypertension   . Renal disorder     Past Surgical History:  Procedure Laterality Date  . A/V SHUNT INTERVENTION Left 01/19/2019   Procedure: LEFT UPPER EXTREMITY A/V SHUNTOGRAM / UPPER EXTREMITY ANGIOGRAM;  Surgeon: Algernon Huxley, MD;  Location: [place] [facility];  Service: Cardiovascular;  Laterality: Left;  . AORTA - FEMORAL ARTERY BYPASS GRAFT    . AV FISTULA PLACEMENT Left 12/26/2018   Procedure: INSERTION OF GORE STRETCH VASCULAR 4-7MM X  45CM IN LEFT UPPER ARM;  Surgeon: Marty Heck, MD;  Location: [facility];  Service: Vascular;  Laterality: Left;    Family History  Problem Relation Age of Onset  . Hypertension Other   . Diabetes Other   . Clotting disorder Father    Social History:  reports that he quit smoking about 7 months ago. He has never used smokeless tobacco. He reports previous alcohol use. He reports that he does not use drugs.  Allergies:  Allergies  Allergen Reactions  . Codeine Nausea Only    Patient questioned this (??)  . Sulfa Antibiotics Hives and Nausea And Vomiting    Medications Prior to Admission  Medication Sig Dispense Refill  . amLODipine (NORVASC) 5 MG tablet Take 1 tablet (5 mg total)  by mouth daily at 8 pm. 30 tablet 1  . apixaban (ELIQUIS) 2.5 MG TABS tablet Take 1 tablet (2.5 mg total) by mouth 2 (two) times daily. 60 tablet 1  . aspirin EC 81 MG tablet Take 1 tablet (81 mg total) by mouth daily. 30 tablet 1  . atorvastatin (LIPITOR) 80 MG tablet Take 1 tablet (80 mg total) by mouth at bedtime. 30 tablet 1  . carvedilol (COREG) 25 MG tablet Take 1 tablet (25 mg total) by mouth 2 (two) times daily with a meal. 60 tablet 1  . escitalopram (LEXAPRO) 10 MG tablet Take 1 tablet (10 mg total) by mouth daily. 30 tablet 1  . gabapentin (NEURONTIN) 100 MG capsule Take 1 capsule (100 mg total) by mouth 3 (three) times daily. 90 capsule 1  . hydrALAZINE (APRESOLINE) 25 MG tablet Take 1 tablet (25 mg total) by mouth every 8 (eight) hours. 90 tablet 1  . ibuprofen (ADVIL,MOTRIN) 400 MG tablet Take 1 tablet (400 mg total) by mouth every 6 (six) hours as needed for moderate pain. 30 tablet 1  . multivitamin (RENA-VIT) TABS tablet Take 1 tablet by mouth daily. 30 each 1  . neomycin-bacitracin-polymyxin (NEOSPORIN) OINT Apply 1 application topically 2 (two) times daily. 56 g 1  . sevelamer carbonate (RENVELA) 800 MG tablet Take 2 tablets (1,600 mg total) by mouth 3 (three) times daily with meals. 180 tablet 1    No results found for  this or any previous visit (from the past 48 hour(s)). No results found.  Review of Systems  Constitutional: Negative for chills and fever.  HENT: Negative for sore throat and tinnitus.   Eyes: Negative for blurred vision and redness.  Respiratory: Positive for shortness of breath. Negative for cough.   Cardiovascular: Negative for chest pain, palpitations, orthopnea and PND.  Gastrointestinal: Negative for abdominal pain, diarrhea, nausea and vomiting.  Genitourinary: Negative for dysuria, frequency and urgency.  Musculoskeletal: Negative for joint pain and myalgias.  Skin: Negative for rash.       No lesions  Neurological: Negative for speech change,  focal weakness and weakness.  Endo/Heme/Allergies: Does not bruise/bleed easily.       No temperature intolerance  Psychiatric/Behavioral: Negative for depression and suicidal ideas.    There were no vitals taken for this visit. Physical Exam  Vitals reviewed. Constitutional: He is oriented to person, place, and time. He appears well-developed and well-nourished. No distress.  HENT:  Head: Normocephalic and atraumatic.  Mouth/Throat: Oropharynx is clear and moist.  Eyes: Pupils are equal, round, and reactive to light. Conjunctivae and EOM are normal. No scleral icterus.  Neck: Normal range of motion. Neck supple. No JVD present. No tracheal deviation present. No thyromegaly present.  Cardiovascular: Normal rate, regular rhythm and normal heart sounds. Exam reveals no gallop and no friction rub.  No murmur heard. Respiratory: He is in respiratory distress. He has rales.  GI: Soft. Bowel sounds are normal. He exhibits no distension. There is no abdominal tenderness.  Genitourinary:    Genitourinary Comments: Deferred   Musculoskeletal: Normal range of motion.        General: No deformity or edema.  Lymphadenopathy:    He has no cervical adenopathy.  Neurological: He is alert and oriented to person, place, and time. No cranial nerve deficit.  Skin: Skin is warm and dry. No rash noted. No erythema.  Psychiatric: Judgment and thought content normal.  Difficult to assess mood and behavior given urgent circumstances of admission due to respiratory distress     Assessment/Plan This is a 43 year old male admitted for respiratory failure. 1.  Respiratory failure: Acute with hypoxemia.  Continue supplemental oxygen via nonrebreather.  Etiology appears to be fluid overload secondary to noncompliance with fluid intake as well as uncontrolled hypertension.  Patient makes a little bit of urine.  I have ordered Lasix 60 mg IV and called nephrology for urgent dialysis treatment. 2.  Hypertension:  Uncontrolled; apply Nitropaste and administer hydralazine IV. 3.  ESRD: On dialysis; resume antihypertensive medications following dialysis.  He is due for dialysis today.  Continue Renvela for secondary hyperparathyroidism. 4.  Depression: Psychiatry to continue medication management and behavioral therapy once the patient is stabilized.  Will place order for sitter if actively suicidal or homicidal. 5.  DVT prophylaxis: Eliquis 6.  GI prophylaxis: None The patient is a full code.  I personally spent 45 minutes in critical care time with this patient.  Harrie Foreman, MD 01/27/2019, 6:41 AM

## 2019-01-27 NOTE — Progress Notes (Signed)
Post HD Assessment    01/27/19 1220  Neurological  Orientation Level Oriented X4  Respiratory  Respiratory Pattern Regular;Unlabored  Chest Assessment Chest expansion symmetrical  Bilateral Breath Sounds Clear;Diminished  Cough None  Cardiac  Pulse Irregular  Heart Sounds S1, S2  ECG Monitor Yes  Cardiac Rhythm ST  Vascular  R Radial Pulse +2  L Radial Pulse +2  Edema Generalized  Generalized Edema +1  Psychosocial  Psychosocial (WDL) WDL  Patient Behaviors Cooperative;Calm  Needs Expressed Emotional;Physical  Emotional support given Given to patient

## 2019-01-27 NOTE — Progress Notes (Signed)
*  PRELIMINARY RESULTS* Echocardiogram 2D Echocardiogram has been performed.  Brett Small 01/27/2019, 2:04 PM

## 2019-01-27 NOTE — Consult Note (Signed)
Name: Brett Small MRN: [id number] DOB: [birth year]    ADMISSION DATE:  (Not on file) CONSULTATION DATE: 01/27/2019  REFERRING MD : Dr. Marcille Blanco  CHIEF COMPLAINT:  Shortness of Breath   BRIEF PATIENT DESCRIPTION:  43 yo male with ESRD on HD admitted with acute hypoxic respiratory failure secondary to pulmonary edema requiring Bipap   SIGNIFICANT EVENTS/STUDIES:  04/7-Pt in behavioral medicine unit rapid response called due to pt developing acute hypoxic respiratory failure subsequently transferred/admitted to the stepdown unit requiring Bipap   HISTORY OF PRESENT ILLNESS:   This is a 43 yo male with a PMH of PAD requiring Aortobifemoral Bypass Graft with Redo Operation (then subsequently complicated by graft thrombosis requiring left bypass limb thrombectomy and stent insertion into the aorta developing ESRD in Sept 2019 requiring HD)  and HTN.  He was discharged from [facility] on 01/07/2019 following treatment of depression with continued suicidal ideation and thoughts of overdosing on his medications.  Due to homelessness, no transportation to outpatient dialysis, and suicidal ideation with no realistic safety plan he was discharged to [facility]. On 01/27/2019 he developed acute hypoxic respiratory failure secondary to pulmonary edema requiring NRB, therefore rapid response called.  He was subsequently transferred/admtted to the stepdown unit and placed on Bipap.    PAST MEDICAL HISTORY :   has a past medical history of Hypertension and Renal disorder.  has a past surgical history that includes Aorta - femoral artery bypass graft; AV fistula placement (Left, 12/26/2018); and A/V SHUNT INTERVENTION (Left, 01/19/2019). Prior to Admission medications   Medication Sig Start Date End Date Taking? Authorizing Provider  amLODipine (NORVASC) 5 MG tablet Take 1 tablet (5 mg total) by mouth daily at 8 pm. 01/26/19   Clapacs, Madie Reno, MD  apixaban (ELIQUIS) 2.5 MG TABS  tablet Take 1 tablet (2.5 mg total) by mouth 2 (two) times daily. 01/26/19   Clapacs, Madie Reno, MD  aspirin EC 81 MG tablet Take 1 tablet (81 mg total) by mouth daily. 01/26/19   Clapacs, Madie Reno, MD  atorvastatin (LIPITOR) 80 MG tablet Take 1 tablet (80 mg total) by mouth at bedtime. 01/26/19   Clapacs, Madie Reno, MD  carvedilol (COREG) 25 MG tablet Take 1 tablet (25 mg total) by mouth 2 (two) times daily with a meal. 01/26/19   Clapacs, Madie Reno, MD  escitalopram (LEXAPRO) 10 MG tablet Take 1 tablet (10 mg total) by mouth daily. 01/26/19   Clapacs, Madie Reno, MD  gabapentin (NEURONTIN) 100 MG capsule Take 1 capsule (100 mg total) by mouth 3 (three) times daily. 01/26/19   Clapacs, Madie Reno, MD  hydrALAZINE (APRESOLINE) 25 MG tablet Take 1 tablet (25 mg total) by mouth every 8 (eight) hours. 01/26/19   Clapacs, Madie Reno, MD  ibuprofen (ADVIL,MOTRIN) 400 MG tablet Take 1 tablet (400 mg total) by mouth every 6 (six) hours as needed for moderate pain. 01/26/19   Clapacs, Madie Reno, MD  multivitamin (RENA-VIT) TABS tablet Take 1 tablet by mouth daily. 01/27/19   Clapacs, Madie Reno, MD  neomycin-bacitracin-polymyxin (NEOSPORIN) OINT Apply 1 application topically 2 (two) times daily. 01/26/19   Clapacs, Madie Reno, MD  sevelamer carbonate (RENVELA) 800 MG tablet Take 2 tablets (1,600 mg total) by mouth 3 (three) times daily with meals. 01/26/19   Clapacs, Madie Reno, MD   Allergies  Allergen Reactions  . Codeine Nausea Only    Patient questioned this (??)  . Sulfa Antibiotics Hives and Nausea And Vomiting  FAMILY HISTORY:  family history includes Clotting disorder in his father; Diabetes in an other family member; Hypertension in an other family member. SOCIAL HISTORY:  reports that he quit smoking about 7 months ago. He has never used smokeless tobacco. He reports previous alcohol use. He reports that he does not use drugs.  REVIEW OF SYSTEMS:   Unable to assess pt on Bipap in severe respiratory distress   SUBJECTIVE:  Unable to assess pt  on Bipap in severe respiratory distress   VITAL SIGNS: Temp:  [97.8 F (36.6 C)] 97.8 F (36.6 C) (04/06 2222) Pulse Rate:  [97] 97 (04/06 2222) Resp:  [18] 18 (04/06 2222) BP: (126-138)/(84-104) 126/104 (04/06 2222) SpO2:  [89 %-95 %] 89 % (04/07 0025)  PHYSICAL EXAMINATION: General: acutely ill appearing male, in respiratory distress on Bipap  Neuro: lethargic, follows commands  HEENT: JVD present  Cardiovascular: NSR, rrr, no R/G  Lungs: diffuse crackles throughout, labored, and tachypneic  Abdomen: +BS x4, soft, non distended  Musculoskeletal: normal bulk and tone, no edema  Skin: left upper forearm fistula positive bruit and thrill  Recent Labs  Lab 01/23/19 0641 01/24/19 1019  NA  --  141  K  --  3.7  CL  --  104  CO2  --  28  BUN  --  30*  CREATININE 5.31* 5.15*  GLUCOSE  --  134*   Recent Labs  Lab 01/23/19 0641 01/26/19 0637  HGB 8.7* 9.6*  HCT 27.1* 31.3*  WBC 5.3 6.9  PLT 176 222   No results found.  ASSESSMENT / PLAN:  Acute hypoxic respiratory failure secondary to pulmonary edema in setting of ESRD on HD  Supplemental O2 for dyspnea and/or hypoxia  Nephrology consulted appreciate input-pt to undergo emergent HD  60 mg IV lasix x1   HTN  Continuous telemetry monitoring  Prn hydralazine for bp management  Will check troponin   Depression  Hx: Suicidal ideation  Continue current medications per psychiatry recommendations   VTE px: subq heparin  [name], [place] Pager [phone] (please enter 7 digits) PCCM Consult Pager [phone] (please enter 7 digits)

## 2019-01-27 NOTE — Progress Notes (Signed)
Pastoral Care Rapid Response    01/27/19 0615  Clinical Encounter Type  Visited With Patient;Health care provider  Visit Type Behavioral Health;Spiritual support;Code  Referral From Nurse  Consult/Referral To Chaplain  Spiritual Encounters  Spiritual Needs Prayer  Stress Factors  Patient Stress Factors Health changes    Brett Small arrived due to RR pages.  Medical team was assessing and treating pt.  Chap provided pastoral presence and prayed silently for pt and staff.  Darcey Nora, Chaplain

## 2019-01-27 NOTE — Progress Notes (Signed)
[facility]  ROUNDING NOTE   Subjective:   Rapid response called for shortness of breath and hypoxia. Patient now moved to ICU and placed on BIPAP.   Patient being set up for urgent hemodialysis unit. UF goal of 2 liters  Objective:  Vital signs in last 24 hours:  Temp:  [97.1 F (36.2 C)-97.8 F (36.6 C)] 97.1 F (36.2 C) (04/07 0800) Pulse Rate:  [97-115] 110 (04/07 0800) Resp:  [18-28] 26 (04/07 0800) BP: (118-188)/(84-127) 118/85 (04/07 0800) SpO2:  [89 %-100 %] 100 % (04/07 0800) Weight:  [50.3 kg] 50.3 kg (04/07 0651)  Weight change:  Filed Weights   01/27/19 0651  Weight: 50.3 kg    Intake/Output: No intake/output data recorded.   Intake/Output this shift:  No intake/output data recorded.  Physical Exam: General: criticall ill appearing  Head: Normocephalic, atraumatic. Moist oral mucosal membranes  Eyes: Anicteric  Neck: Supple,   Lungs:  Bilateral crackles, BIPAP  Heart: regular  Abdomen:  Soft, nontender, bowel sounds present  Extremities:  no peripheral edema.  Neurologic: Awake, alert, oriented  Skin: No lesions      Access: IJ PermCath, left arm AVG +thrill and +bruit    Basic Metabolic Panel: Recent Labs  Lab 01/23/19 0641 01/24/19 1019 01/27/19 0713  NA  --  141 144  K  --  3.7 5.8*  CL  --  104 106  CO2  --  28 25  GLUCOSE  --  134* 158*  BUN  --  30* 59*  CREATININE 5.31* 5.15* 8.39*  CALCIUM  --  7.7* 8.1*  PHOS  --  2.5 7.1*    Liver Function Tests: Recent Labs  Lab 01/24/19 1019 01/27/19 0713  AST  --  17  ALT  --  10  ALKPHOS  --  71  BILITOT  --  0.7  PROT  --  6.9  ALBUMIN 3.0* 3.4*   No results for input(s): LIPASE, AMYLASE in the last 168 hours. No results for input(s): AMMONIA in the last 168 hours.  CBC: Recent Labs  Lab 01/23/19 0641 01/26/19 0637 01/27/19 0713  WBC 5.3 6.9 13.2*  HGB 8.7* 9.6* 10.6*  HCT 27.1* 31.3* 35.0*  MCV 96.4 98.7 99.7  PLT 176 222 232    Cardiac  Enzymes: Recent Labs  Lab 01/27/19 0713  TROPONINI <0.03    BNP: Invalid input(s): POCBNP  CBG: Recent Labs  Lab 01/27/19 0640  GLUCAP 181*    Microbiology: Results for orders placed or performed during the hospital encounter of 12/24/18  MRSA PCR Screening     Status: None   Collection Time: 12/27/18  1:20 PM  Result Value Ref Range Status   MRSA by PCR NEGATIVE NEGATIVE Final    Comment:        The GeneXpert MRSA Assay (FDA approved for NASAL specimens only), is one component of a comprehensive MRSA colonization surveillance program. It is not intended to diagnose MRSA infection nor to guide or monitor treatment for MRSA infections. Performed at [facility], [place] [address] [place], [place] [address]     Coagulation Studies: No results for input(s): LABPROT, INR in the last 72 hours.  Urinalysis: No results for input(s): COLORURINE, LABSPEC, PHURINE, GLUCOSEU, HGBUR, BILIRUBINUR, KETONESUR, PROTEINUR, UROBILINOGEN, NITRITE, LEUKOCYTESUR in the last 72 hours.  Invalid input(s): APPERANCEUR    Imaging: Dg Chest Port 1 View  Result Date: 01/27/2019 CLINICAL DATA:  Cough.  Hypertension EXAM: PORTABLE CHEST 1 VIEW COMPARISON:  12/24/2018 FINDINGS: Symmetric perihilar interstitial and airspace opacity. No effusion or pneumothorax. Borderline heart size. Dialysis catheter on the right with tip at the SVC. Venous stenting at the left axilla. IMPRESSION: Symmetric perihilar airspace disease favoring edema. Electronically Signed   By: Monte Fantasia M.D.   On: 01/27/2019 07:17     Medications:    . docusate sodium  100 mg Oral BID  . heparin  5,000 Units Subcutaneous Q8H  . nitroGLYCERIN  0.5 inch Topical Q6H   acetaminophen **OR** acetaminophen, ondansetron **OR** ondansetron (ZOFRAN) IV  Assessment/ Plan:  [name] is a 43 y.o. white male with end stage renal disease on hemodialysis, hypertension, depression, aortobifemoral bypass, who was  admitted to [facility] on 01/07/2019 for evaluation and treatment of major depressive disorder.  Patient is homeless - No dialysis unit assigned  1.  ESRD: emergent hemodialysis treatment today for hypoxia requiring noninvasive ventilation. - UF goal of 2.5 liters.  - IV furosemide administered. Start PO tomorrow.   2.  Anemia chronic kidney disease:  Hemoglobin 10.6 - EPO with HD treatment  3.  Secondary hyperparathyroidism with hyperphosphatemia:  - Sevelamer with meals  4.  Hypertension with CKD Previously on  amlodipine, carvedilol, hydralazine.   Started furosemide  5. Dialysis access device: maturing left AVG placed on 3/6 by Dr. Carlis Abbott.  Underwent angiogram on 3/30 with angioplasty and placement of stent by Dr Lucky Cowboy   LOS: 0 Jayona Mccaig 4/7/20208:27 AM

## 2019-01-27 NOTE — TOC Initial Note (Signed)
Transition of Care [facility]) - Initial/Assessment Note    Patient Details  Name: Brett Small MRN: [id number] Date of Birth: 03-07-1976  [facility] CM/SW Contact:    Shelbie Hutching, RN Phone Number: 01/27/2019, 2:45 PM  Clinical Narrative:                 Patient admitted with acute respiratory failure initially requiring Bipap.  Patient is doing much better now on [place].  Patient received HD today in the ICU.  Patient is a dialysis patient- [name] with patient pathways is aware of admission and trying to find a clinic that will accept the patient.  Patient reports that he started dialysis this past September.  Patient was living in [place] before coming here- he reports he and his cousin were coming here to find a place to live- they drove together but he has not seen his cousin since the middle of March.  Patient reports he was stranded here.  Patient is staying at a hotel and he will discharge there when ready.  RNCM gave the patient [facility] information to help schedule rides to dialysis, also gave patient [facility] referral line to find a new PCP.  Patient reports that he has 3 months worth of prescriptions so he has time to find a doctor before he needs refills.  RNCM will cont to follow and assist with any other discharge needs.   Expected Discharge Plan: Home/Self Care Barriers to Discharge: Continued Medical Work up   Patient Goals and CMS Choice        Expected Discharge Plan and Services Expected Discharge Plan: Home/Self Care In-house Referral: Clinical Social Work Discharge Planning Services: CM Consult   Living arrangements for the past 2 months: No permanent address([facility] in [place])                          Prior Living Arrangements/Services Living arrangements for the past 2 months: No permanent address([facility] in [place]) Lives with:: Self Patient language and need for interpreter reviewed:: Yes Do you  feel safe going back to the place where you live?: Yes      Need for Family Participation in Patient Care: Yes (Comment)(patient has a chronic medical condition) Care giver support system in place?: No (comment)   Criminal Activity/Legal Involvement Pertinent to Current Situation/Hospitalization: No - Comment as needed  Activities of Daily Living Home Assistive Devices/Equipment: None ADL Screening (condition at time of admission) Patient's cognitive ability adequate to safely complete daily activities?: Yes Is the patient deaf or have difficulty hearing?: No Does the patient have difficulty seeing, even when wearing glasses/contacts?: No Does the patient have difficulty concentrating, remembering, or making decisions?: No Patient able to express need for assistance with ADLs?: Yes Does the patient have difficulty dressing or bathing?: No Independently performs ADLs?: Yes (appropriate for developmental age) Does the patient have difficulty walking or climbing stairs?: No Weakness of Legs: None Weakness of Arms/Hands: None  Permission Sought/Granted                  Emotional Assessment Appearance:: Appears stated age Attitude/Demeanor/Rapport: Engaged Affect (typically observed): Accepting Orientation: : Oriented to Self, Oriented to Place, Oriented to Situation, Oriented to  Time Alcohol / Substance Use: Not Applicable Psych Involvement: No (comment)  Admission diagnosis:  Respiratory Failure Patient Active Problem List   Diagnosis Date Noted  . Acute respiratory failure with hypoxemia ([place]) 01/27/2019  .  Depression 01/07/2019  . MDD (major depressive disorder), single episode, severe , no psychosis ([place])   . Homelessness   . Acute respiratory failure with hypoxia ([place]) 12/25/2018  . End stage renal disease ([place]) 12/25/2018  . Hypertension 12/25/2018  . Renal osteodystrophy 12/25/2018   PCP:  Patient, [name] Pharmacy:   [facility] [place], [place] [address] [place] [address] Phone: [phone] Fax: [phone]     Social Determinants of Health ([place]) Interventions    Readmission Risk Interventions No flowsheet data found.

## 2019-01-27 NOTE — Plan of Care (Signed)
  Problem: Coping: Goal: Coping ability will improve Outcome: Progressing  Coping ability is improving

## 2019-01-27 NOTE — Progress Notes (Signed)
Pre HD Assessment   01/27/19 0815  Neurological  Level of Consciousness Alert  Orientation Level Oriented X4  Respiratory  Respiratory Pattern Regular;Unlabored  Chest Assessment Chest expansion symmetrical  Bilateral Breath Sounds Diminished  Cough None  Cardiac  Pulse Irregular  Heart Sounds S1, S2  ECG Monitor Yes  Cardiac Rhythm ST  Vascular  R Radial Pulse +2  L Radial Pulse +2  Edema Generalized  Generalized Edema +1  Psychosocial  Psychosocial (WDL) WDL  Patient Behaviors Cooperative;Calm  Needs Expressed Emotional;Physical  Emotional support given Given to patient

## 2019-01-27 NOTE — Progress Notes (Signed)
Pre HD Tx  01/27/19 0828  Vital Signs  Temp 97.6 F (36.4 C)  Temp Source Axillary  Pulse Rate 99  Pulse Rate Source Monitor  Resp (!) 24  BP 127/84  BP Location Right Arm  BP Method Automatic  Patient Position (if appropriate) Lying  Oxygen Therapy  SpO2 100 %  O2 Device Bi-PAP  Pulse Oximetry Type Continuous  Pain Assessment  Pain Scale 0-10  Pain Score 0  Dialysis Weight  Weight 50.7 kg  Type of Weight Pre-Dialysis  Time-Out for Hemodialysis  What Procedure? HD  Pt Identifiers(min of two) First/Last Name;MRN/Account#  Correct Site? Yes  Correct Side? Yes  Correct Procedure? Yes  Consents Verified? Yes  Rad Studies Available? N/A  Safety Precautions Reviewed? Yes  Engineer, civil (consulting) Number 2  Station Number  (Bedside ICU 17)  UF/Alarm Test Passed  Conductivity: Meter 14  Conductivity: Machine  14  pH 7.4  Reverse Osmosis Portable 3  Normal Saline Lot Number [id number]  Dialyzer Lot Number [id number]  Disposable Set Lot Number [id number]  Machine Temperature 98.6 F (37 C)  Musician and Audible Yes  Blood Lines Intact and Secured Yes  Pre Treatment Patient Checks  Vascular access used during treatment Catheter  Hepatitis B Surface Antigen Results Negative  Date Hepatitis B Surface Antigen Drawn 01/24/19  Hepatitis B Surface Antibody  (< 10)  Date Hepatitis B Surface Antibody Drawn 01/24/19  Hemodialysis Consent Verified Yes  Hemodialysis Standing Orders Initiated Yes  ECG (Telemetry) Monitor On Yes  Prime Ordered Normal Saline  Length of  DialysisTreatment -hour(s) 3.5 Hour(s)  Dialysis Treatment Comments  (Na 140)  Dialyzer Elisio 17H NR  Dialysate 3K, 2.5 Ca  Dialysis Anticoagulant None  Dialysate Flow Ordered 600  Blood Flow Rate Ordered 400 mL/min  Ultrafiltration Goal 2.5 Liters  Dialysis Blood Pressure Support Ordered Albumin  Education / Care Plan  Dialysis Education Provided Yes  Documented Education in Care Plan Yes  Fistula /  Graft Left Upper arm Arteriovenous vein graft  Placement Date/Time: 12/26/18 1024   Placed prior to admission: No  Orientation: Left  Access Location: Upper arm  Access Type: (c) Arteriovenous vein graft  Site Condition No complications (Maturing )  Hemodialysis Catheter Right Subclavian  No Placement Date or Time found.   Placed prior to admission: Yes  Orientation: Right  Access Location: Subclavian  Site Condition No complications  Blue Lumen Status Blood return noted  Red Lumen Status Blood return noted  Dressing Type Occlusive;Biopatch  Dressing Status Clean;Dry;Intact

## 2019-01-27 NOTE — Progress Notes (Signed)
Recreation Therapy Notes  INPATIENT RECREATION TR PLAN  Patient Details Name: Lynell Kussman MRN: [id number] DOB: April 30, 1976 Today's Date: 01/27/2019  Rec Therapy Plan Is patient appropriate for Therapeutic Recreation?: Yes Treatment times per week: at least 3 Estimated Length of Stay: 5-7 days TR Treatment/Interventions: Group participation (Comment)  Discharge Criteria Pt will be discharged from therapy if:: Discharged Treatment plan/goals/alternatives discussed and agreed upon by:: Patient/family  Discharge Summary Short term goals set: Patient will engage in groups without prompting or encouragement from LRT x3 group sessions within 5 recreation therapy group sessions Short term goals met: Complete Progress toward goals comments: Groups attended Which groups?: Self-esteem, Leisure education, Coping skills, Goal setting, Communication, Other (Comment)(Emotions) Reason goals not met: N/A Therapeutic equipment acquired: N/A Reason patient discharged from therapy: Discharge from hospital Pt/family agrees with progress & goals achieved: Yes Date patient discharged from therapy: 01/26/19   Vaudie Engebretsen 01/27/2019, 11:20 AM

## 2019-01-27 NOTE — Progress Notes (Signed)
Patient alert and oriented x 4, denies SI/HI/AVH no distress noted, respiration unlabored, chest expansion symmetrical,  thoughts are organized and coherent, he is interacting appropriately with peers and staff. Patient makes appropriate eye contact with writer compliant with evening medications and he attended evening wrap up group, 15 minutes safety checks maintained.

## 2019-01-27 NOTE — Plan of Care (Signed)
  Problem: Education: Goal: Utilization of techniques to improve thought processes will improve Outcome: Progressing  Patient's thought process improved

## 2019-01-27 NOTE — Progress Notes (Signed)
HD Tx started w/o complication    78/47/84 0830  Vital Signs  Pulse Rate 97  Pulse Rate Source Monitor  Resp (!) 24  BP 121/83  BP Location Right Arm  BP Method Automatic  Patient Position (if appropriate) Lying  Oxygen Therapy  SpO2 100 %  O2 Device Bi-PAP  Pulse Oximetry Type Continuous  During Hemodialysis Assessment  Blood Flow Rate (mL/min) 400 mL/min  Arterial Pressure (mmHg) -180 mmHg  Venous Pressure (mmHg) 140 mmHg  Transmembrane Pressure (mmHg) 50 mmHg  Ultrafiltration Rate (mL/min) 1070 mL/min  Dialysate Flow Rate (mL/min) 600 ml/min  Conductivity: Machine  14.3  HD Safety Checks Performed Yes  Dialysis Fluid Bolus Normal Saline  Bolus Amount (mL) 250 mL  Intra-Hemodialysis Comments Tx initiated

## 2019-01-27 NOTE — Progress Notes (Signed)
Pt nauseated on bipap, taken off and placed on 6lpm [place], sats 92%, respiratory rate 20/min. Will continue to monitor. RN notified.

## 2019-01-27 NOTE — Progress Notes (Signed)
Called for rapid response, pt increased WOB, SOB, Hypoxic and diaphoretic. Pt placed on 100% NRB transferred to ICU without incident and placed on Bipap.

## 2019-01-28 LAB — BASIC METABOLIC PANEL
Anion gap: 8 (ref 5–15)
BUN: 41 mg/dL — ABNORMAL HIGH (ref 6–20)
CO2: 29 mmol/L (ref 22–32)
Calcium: 8 mg/dL — ABNORMAL LOW (ref 8.9–10.3)
Chloride: 101 mmol/L (ref 98–111)
Creatinine, Ser: 6.4 mg/dL — ABNORMAL HIGH (ref 0.61–1.24)
GFR calc Af Amer: 11 mL/min — ABNORMAL LOW (ref >60)
GFR calc non Af Amer: 10 mL/min — ABNORMAL LOW (ref >60)
Glucose, Bld: 194 mg/dL — ABNORMAL HIGH (ref 70–99)
Potassium: 4.3 mmol/L (ref 3.5–5.1)
Sodium: 138 mmol/L (ref 135–145)

## 2019-01-28 MED ORDER — CHLORHEXIDINE GLUCONATE CLOTH 2 % EX PADS
6.0000 | MEDICATED_PAD | Freq: Every day | CUTANEOUS | Status: DC
  Administered 2019-01-28 – 2019-01-29 (×2): 6 via TOPICAL

## 2019-01-28 MED ORDER — MUPIROCIN 2 % EX OINT
1.0000 "application " | TOPICAL_OINTMENT | Freq: Two times a day (BID) | CUTANEOUS | Status: DC
  Administered 2019-01-28 – 2019-01-29 (×3): 1 via NASAL
  Filled 2019-01-28: qty 22

## 2019-01-28 NOTE — Progress Notes (Addendum)
Oxygen wean to 1L and current oxygen level at 95%.

## 2019-01-28 NOTE — Progress Notes (Signed)
Patient was set up to have out patient hemodialysis at [facility] prior to coming to [facility], however chair was given away and admission to clinic was cancelled. Sent referrals to three other clinics near the hotel Patient is discharging to. Two have denied, waiting on third's response.

## 2019-01-28 NOTE — Progress Notes (Signed)
[facility] [place] NAME: Brett Small    MR#:  [id number]  DATE OF BIRTH:  [birth year]  SUBJECTIVE:  CHIEF COMPLAINT:  No chief complaint on file.  Shortness of breath is better.  Continues to be on 4 L oxygen.  Does not wear oxygen at home.  Had hemodialysis yesterday.  REVIEW OF SYSTEMS:    Review of Systems  Constitutional: Positive for malaise/fatigue. Negative for chills and fever.  HENT: Negative for sore throat.   Eyes: Negative for blurred vision, double vision and pain.  Respiratory: Positive for cough and shortness of breath. Negative for hemoptysis and wheezing.   Cardiovascular: Negative for chest pain, palpitations, orthopnea and leg swelling.  Gastrointestinal: Negative for abdominal pain, constipation, diarrhea, heartburn, nausea and vomiting.  Genitourinary: Negative for dysuria and hematuria.  Musculoskeletal: Negative for back pain and joint pain.  Skin: Negative for rash.  Neurological: Negative for sensory change, speech change, focal weakness and headaches.  Endo/Heme/Allergies: Does not bruise/bleed easily.  Psychiatric/Behavioral: Negative for depression. The patient is not nervous/anxious.     DRUG ALLERGIES:   Allergies  Allergen Reactions  . Codeine Nausea Only    Patient questioned this (??)  . Sulfa Antibiotics Hives and Nausea And Vomiting    VITALS:  Blood pressure 132/88, pulse 93, temperature 98.4 F (36.9 C), temperature source Oral, resp. rate 18, height 5\' 1"  (1.549 m), weight 51.6 kg, SpO2 98 %.  PHYSICAL EXAMINATION:   Physical Exam  GENERAL:  43 y.o.-year-old patient lying in the bed with no acute distress.  EYES: Pupils equal, round, reactive to light and accommodation. No scleral icterus. Extraocular muscles intact.  HEENT: Head atraumatic, normocephalic. Oropharynx and nasopharynx clear.  NECK:  Supple, no jugular venous distention. No thyroid enlargement, no tenderness.  LUNGS: Good  air entry bilaterally.  Bilateral crackles. CARDIOVASCULAR: S1, S2 normal. No murmurs, rubs, or gallops.  ABDOMEN: Soft, nontender, nondistended. Bowel sounds present. No organomegaly or mass.  EXTREMITIES: No cyanosis, clubbing or edema b/l.    NEUROLOGIC: Cranial nerves II through XII are intact. No focal Motor or sensory deficits b/l.   PSYCHIATRIC: The patient is alert and oriented x 3.  SKIN: No obvious rash, lesion, or ulcer.   LABORATORY PANEL:   CBC Recent Labs  Lab 01/27/19 0713  WBC 13.2*  HGB 10.6*  HCT 35.0*  PLT 232   ------------------------------------------------------------------------------------------------------------------ Chemistries  Recent Labs  Lab 01/27/19 0713 01/28/19 0922  NA 144 138  K 5.8* 4.3  CL 106 101  CO2 25 29  GLUCOSE 158* 194*  BUN 59* 41*  CREATININE 8.39* 6.40*  CALCIUM 8.1* 8.0*  AST 17  --   ALT 10  --   ALKPHOS 71  --   BILITOT 0.7  --    ------------------------------------------------------------------------------------------------------------------  Cardiac Enzymes Recent Labs  Lab 01/27/19 0713  TROPONINI <0.03   ------------------------------------------------------------------------------------------------------------------  RADIOLOGY:  Dg Chest Port 1 View  Result Date: 01/27/2019 CLINICAL DATA:  Cough.  Hypertension EXAM: PORTABLE CHEST 1 VIEW COMPARISON:  12/24/2018 FINDINGS: Symmetric perihilar interstitial and airspace opacity. No effusion or pneumothorax. Borderline heart size. Dialysis catheter on the right with tip at the SVC. Venous stenting at the left axilla. IMPRESSION: Symmetric perihilar airspace disease favoring edema. Electronically Signed   By: Monte Fantasia M.D.   On: 01/27/2019 07:17     ASSESSMENT AND PLAN:   This is a 43 year old male admitted for respiratory failure  1.  Acute hypoxic respiratory failure  secondary to pulmonary edema and fluid accumulation from missing hemodialysis and  noncompliance. Improving with hemodialysis.  Continues to need oxygen and has crackles on examination.  Will need further hemodialysis.  Discussed with nephrology Dr. Juleen China.  Hemodialysis again in the morning.  2.  Hypertension: Uncontrolled; applied Nitropaste and administer hydralazine IV.  3.  ESRD: On hemodialysis Patient has lost his spot at outpatient dialysis center.  Nephrology Dr. Juleen China working on this. We will also consult case manager.  4.  Depression: Continue home medications  All the records are reviewed and case discussed with Care Management/Social Workerr. Management plans discussed with the patient, family and they are in agreement.  CODE STATUS: Full code  DVT Prophylaxis: SCDs  TOTAL TIME TAKING CARE OF THIS PATIENT: 35 minutes.   POSSIBLE D/C IN 1-2 DAYS, DEPENDING ON CLINICAL CONDITION.  Brett Small M.D on 01/28/2019 at 11:26 AM  Between 7am to 6pm - Pager - [phone]  After 6pm go to [email or web address] - password [email or web address] [facility]  Office  [phone]  CC: Primary care physician; Patient, [name]  Note: This dictation was prepared with Dragon dictation along with smaller phrase technology. Any transcriptional errors that result from this process are unintentional.

## 2019-01-28 NOTE — Progress Notes (Signed)
MD notified: The patient wanted to know if his hydralazine, Eliquis, coreg, NOrvasc, ronvela and PRN atarax will be restarted.  Most current BP is 140/93, HR 100 and is currently on nitroglycerin ointment.

## 2019-01-28 NOTE — Progress Notes (Signed)
[facility]  ROUNDING NOTE   Subjective:   Hemodialysis treatment yesterday. Tolerated treatment well. UF of 1.2 liters. Moved to gen med floor. Still requiring oxygen.   Objective:  Vital signs in last 24 hours:  Temp:  [98.3 F (36.8 C)-98.9 F (37.2 C)] 98.9 F (37.2 C) (04/08 1254) Pulse Rate:  [93-113] 98 (04/08 1254) Resp:  [10-20] 18 (04/08 1254) BP: (115-150)/(61-89) 150/88 (04/08 1254) SpO2:  [91 %-99 %] 93 % (04/08 1256) Weight:  [50.4 kg-51.6 kg] 51.6 kg (04/08 0543)  Weight change: 0.4 kg Filed Weights   01/27/19 1215 01/27/19 2146 01/28/19 0543  Weight: 50.2 kg 50.4 kg 51.6 kg    Intake/Output: I/O last 3 completed shifts: In: -  Out: 1229 [Other:1229]   Intake/Output this shift:  Total I/O In: 480 [P.O.:480] Out: -   Physical Exam: General: Laying in bed, NAD  Head: Normocephalic, atraumatic. Moist oral mucosal membranes  Eyes: Anicteric  Neck: Supple,   Lungs:  clear  Heart: regular  Abdomen:  Soft, nontender, bowel sounds present  Extremities:  no peripheral edema.  Neurologic: Awake, alert, oriented  Skin: No lesions      Access: IJ PermCath, left arm AVG +thrill and +bruit    Basic Metabolic Panel: Recent Labs  Lab 01/23/19 0641 01/24/19 1019 01/27/19 0713 01/28/19 0922  NA  --  141 144 138  K  --  3.7 5.8* 4.3  CL  --  104 106 101  CO2  --  28 25 29   GLUCOSE  --  134* 158* 194*  BUN  --  30* 59* 41*  CREATININE 5.31* 5.15* 8.39* 6.40*  CALCIUM  --  7.7* 8.1* 8.0*  PHOS  --  2.5 7.1*  --     Liver Function Tests: Recent Labs  Lab 01/24/19 1019 01/27/19 0713  AST  --  17  ALT  --  10  ALKPHOS  --  71  BILITOT  --  0.7  PROT  --  6.9  ALBUMIN 3.0* 3.4*   No results for input(s): LIPASE, AMYLASE in the last 168 hours. No results for input(s): AMMONIA in the last 168 hours.  CBC: Recent Labs  Lab 01/23/19 0641 01/26/19 0637 01/27/19 0713  WBC 5.3 6.9 13.2*  HGB 8.7* 9.6* 10.6*  HCT 27.1* 31.3* 35.0*   MCV 96.4 98.7 99.7  PLT 176 222 232    Cardiac Enzymes: Recent Labs  Lab 01/27/19 0713  TROPONINI <0.03    BNP: Invalid input(s): POCBNP  CBG: Recent Labs  Lab 01/27/19 0640  GLUCAP 181*    Microbiology: Results for orders placed or performed during the hospital encounter of 01/27/19  MRSA PCR Screening     Status: Abnormal   Collection Time: 01/27/19  9:55 AM  Result Value Ref Range Status   MRSA by PCR POSITIVE (A) NEGATIVE Final    Comment:        The GeneXpert MRSA Assay (FDA approved for NASAL specimens only), is one component of a comprehensive MRSA colonization surveillance program. It is not intended to diagnose MRSA infection nor to guide or monitor treatment for MRSA infections. RESULT CALLED TO, READ BACK BY AND VERIFIED WITH: Nolberto Hanlon 01/27/19 1109 KLW Performed at [facility], [address] [place], [place] [address]     Coagulation Studies: No results for input(s): LABPROT, INR in the last 72 hours.  Urinalysis: No results for input(s): COLORURINE, LABSPEC, PHURINE, GLUCOSEU, HGBUR, BILIRUBINUR, KETONESUR, PROTEINUR, UROBILINOGEN, NITRITE, LEUKOCYTESUR in the  last 72 hours.  Invalid input(s): APPERANCEUR    Imaging: Dg Chest Port 1 View  Result Date: 01/27/2019 CLINICAL DATA:  Cough.  Hypertension EXAM: PORTABLE CHEST 1 VIEW COMPARISON:  12/24/2018 FINDINGS: Symmetric perihilar interstitial and airspace opacity. No effusion or pneumothorax. Borderline heart size. Dialysis catheter on the right with tip at the SVC. Venous stenting at the left axilla. IMPRESSION: Symmetric perihilar airspace disease favoring edema. Electronically Signed   By: Monte Fantasia M.D.   On: 01/27/2019 07:17     Medications:    . apixaban  2.5 mg Oral BID  . Chlorhexidine Gluconate Cloth  6 each Topical Q0600  . docusate sodium  100 mg Oral BID  . epoetin (EPOGEN/PROCRIT) injection  4,000 Units Intravenous Q T,Th,Sa-HD  . escitalopram  10 mg  Oral Daily  . furosemide  40 mg Oral Once per day on Mon Wed Fri  . mouth rinse  15 mL Mouth Rinse BID  . mupirocin ointment  1 application Nasal BID  . nitroGLYCERIN  0.5 inch Topical Q6H   acetaminophen **OR** acetaminophen, ondansetron **OR** ondansetron (ZOFRAN) IV, traMADol  Assessment/ Plan:  [name] is a 43 y.o. white male with end stage renal disease on hemodialysis, hypertension, depression, aortobifemoral bypass, who was admitted to [facility] on 01/07/2019 for evaluation and treatment of major depressive disorder.  Patient is homeless - No dialysis unit assigned  1.  ESRD: emergent hemodialysis treatment yesterday  2.  Anemia chronic kidney disease:   - EPO with HD treatment  3.  Secondary hyperparathyroidism with hyperphosphatemia:  - Sevelamer with meals  4.  Hypertension with CKD Previously on  amlodipine, carvedilol, hydralazine.   Started furosemide this admission.  5. Dialysis access device: maturing left AVG placed on 3/6 by Dr. Carlis Abbott.  Underwent angiogram on 3/30 with angioplasty and placement of stent by Dr Lucky Cowboy   LOS: [place] 4/8/20202:08 PM

## 2019-01-29 MED ORDER — CARVEDILOL 25 MG PO TABS
25.0000 mg | ORAL_TABLET | Freq: Two times a day (BID) | ORAL | Status: DC
  Administered 2019-01-29: 25 mg via ORAL
  Filled 2019-01-29: qty 1

## 2019-01-29 MED ORDER — SEVELAMER CARBONATE 800 MG PO TABS: 1600.0000 mg | ORAL_TABLET | Freq: Three times a day (TID) | ORAL | Status: DC

## 2019-01-29 MED ORDER — ATORVASTATIN CALCIUM 80 MG PO TABS
80.0000 mg | ORAL_TABLET | Freq: Every day | ORAL | Status: DC
  Filled 2019-01-29: qty 1

## 2019-01-29 MED ORDER — ESCITALOPRAM OXALATE 10 MG PO TABS: 10.0000 mg | ORAL_TABLET | Freq: Every day | ORAL | Status: DC

## 2019-01-29 MED ORDER — HYDROXYZINE HCL 25 MG PO TABS: 25.0000 mg | ORAL_TABLET | Freq: Three times a day (TID) | ORAL | Status: DC | PRN

## 2019-01-29 MED ORDER — GABAPENTIN 100 MG PO CAPS
100.0000 mg | ORAL_CAPSULE | Freq: Three times a day (TID) | ORAL | Status: DC
  Administered 2019-01-29: 100 mg via ORAL
  Filled 2019-01-29: qty 1

## 2019-01-29 MED ORDER — APIXABAN 2.5 MG PO TABS: 2.5000 mg | ORAL_TABLET | Freq: Two times a day (BID) | ORAL | Status: DC

## 2019-01-29 MED ORDER — AMLODIPINE BESYLATE 5 MG PO TABS: 5.0000 mg | ORAL_TABLET | Freq: Every day | ORAL | Status: DC

## 2019-01-29 NOTE — Progress Notes (Signed)
[facility]  ROUNDING NOTE   Subjective:   Seen and examined on hemodialysis treatment. UF goal 2 liters.   SpO2 88-91% on room air. Denies complaints   HEMODIALYSIS FLOWSHEET:  Blood Flow Rate (mL/min): 300 mL/min Arterial Pressure (mmHg): -130 mmHg Venous Pressure (mmHg): 100 mmHg Transmembrane Pressure (mmHg): 50 mmHg Ultrafiltration Rate (mL/min): 740 mL/min Dialysate Flow Rate (mL/min): 600 ml/min Conductivity: Machine : 14.2 Conductivity: Machine : 14.2 Dialysis Fluid Bolus: Normal Saline Bolus Amount (mL): 250 mL    Objective:  Vital signs in last 24 hours:  Temp:  [98.2 F (36.8 C)-99.1 F (37.3 C)] 98.3 F (36.8 C) (04/09 1045) Pulse Rate:  [81-100] 81 (04/09 0439) Resp:  [18] 18 (04/09 0439) BP: (139-158)/(83-93) 158/90 (04/09 1058) SpO2:  [93 %-99 %] 93 % (04/09 0439) Weight:  [51 kg] 51 kg (04/09 0513)  Weight change: 0.3 kg Filed Weights   01/27/19 2146 01/28/19 0543 01/29/19 0513  Weight: 50.4 kg 51.6 kg 51 kg    Intake/Output: I/O last 3 completed shifts: In: 480 [P.O.:480] Out: 0    Intake/Output this shift:  Total I/O In: 360 [P.O.:360] Out: -   Physical Exam: General: Laying in bed, NAD  Head: Normocephalic, atraumatic. Moist oral mucosal membranes  Eyes: Anicteric  Neck: Supple,   Lungs:  clear  Heart: regular  Abdomen:  Soft, nontender, bowel sounds present  Extremities:  no peripheral edema.  Neurologic: Awake, alert, oriented  Skin: No lesions      Access: IJ PermCath, left arm AVG +thrill and +bruit    Basic Metabolic Panel: Recent Labs  Lab 01/23/19 0641 01/24/19 1019 01/27/19 0713 01/28/19 0922  NA  --  141 144 138  K  --  3.7 5.8* 4.3  CL  --  104 106 101  CO2  --  28 25 29   GLUCOSE  --  134* 158* 194*  BUN  --  30* 59* 41*  CREATININE 5.31* 5.15* 8.39* 6.40*  CALCIUM  --  7.7* 8.1* 8.0*  PHOS  --  2.5 7.1*  --     Liver Function Tests: Recent Labs  Lab 01/24/19 1019 01/27/19 0713  AST  --   17  ALT  --  10  ALKPHOS  --  71  BILITOT  --  0.7  PROT  --  6.9  ALBUMIN 3.0* 3.4*   No results for input(s): LIPASE, AMYLASE in the last 168 hours. No results for input(s): AMMONIA in the last 168 hours.  CBC: Recent Labs  Lab 01/23/19 0641 01/26/19 0637 01/27/19 0713  WBC 5.3 6.9 13.2*  HGB 8.7* 9.6* 10.6*  HCT 27.1* 31.3* 35.0*  MCV 96.4 98.7 99.7  PLT 176 222 232    Cardiac Enzymes: Recent Labs  Lab 01/27/19 0713  TROPONINI <0.03    BNP: Invalid input(s): POCBNP  CBG: Recent Labs  Lab 01/27/19 0640  GLUCAP 181*    Microbiology: Results for orders placed or performed during the hospital encounter of 01/27/19  MRSA PCR Screening     Status: Abnormal   Collection Time: 01/27/19  9:55 AM  Result Value Ref Range Status   MRSA by PCR POSITIVE (A) NEGATIVE Final    Comment:        The GeneXpert MRSA Assay (FDA approved for NASAL specimens only), is one component of a comprehensive MRSA colonization surveillance program. It is not intended to diagnose MRSA infection nor to guide or monitor treatment for MRSA infections. RESULT CALLED TO, READ BACK BY  AND VERIFIED WITH: Nolberto Hanlon 01/27/19 1109 KLW Performed at [facility], [address] [place], [place] [address]     Coagulation Studies: No results for input(s): LABPROT, INR in the last 72 hours.  Urinalysis: No results for input(s): COLORURINE, LABSPEC, PHURINE, GLUCOSEU, HGBUR, BILIRUBINUR, KETONESUR, PROTEINUR, UROBILINOGEN, NITRITE, LEUKOCYTESUR in the last 72 hours.  Invalid input(s): APPERANCEUR    Imaging: No results found.   Medications:    . amLODipine  5 mg Oral Q2000  . apixaban  2.5 mg Oral BID  . atorvastatin  80 mg Oral QHS  . carvedilol  25 mg Oral BID WC  . Chlorhexidine Gluconate Cloth  6 each Topical Q0600  . docusate sodium  100 mg Oral BID  . epoetin (EPOGEN/PROCRIT) injection  4,000 Units Intravenous Q T,Th,Sa-HD  . escitalopram  10 mg Oral Daily   . furosemide  40 mg Oral Once per day on Mon Wed Fri  . gabapentin  100 mg Oral TID  . mouth rinse  15 mL Mouth Rinse BID  . mupirocin ointment  1 application Nasal BID  . nitroGLYCERIN  0.5 inch Topical Q6H  . sevelamer carbonate  1,600 mg Oral TID WC   acetaminophen **OR** acetaminophen, hydrOXYzine, ondansetron **OR** ondansetron (ZOFRAN) IV, traMADol  Assessment/ Plan:  [name] is a 43 y.o. white male with end stage renal disease on hemodialysis, hypertension, depression, aortobifemoral bypass, who was admitted to [facility] on 01/07/2019 for evaluation and treatment of major depressive disorder.  Patient is homeless  Found a chair availability at [facility]. TTS schedule.   1.  ESRD: Seen and examined on hemodialysis treatment. Tolerating treatment well. UF of 2 liters goal. Monitor oxygen levels.   2.  Anemia chronic kidney disease:  Hemoglobin 10.6 - EPO with HD treatment  3.  Secondary hyperparathyroidism with hyperphosphatemia:  - Sevelamer with meals  4.  Hypertension with CKD Previously on  amlodipine, carvedilol, hydralazine.   Started furosemide this admission.  5. Dialysis access device: maturing left AVG placed on 3/6 by Dr. Carlis Abbott.  Underwent angiogram on 3/30 with angioplasty and placement of stent by Dr Lucky Cowboy   LOS: [place] 4/9/202011:56 AM

## 2019-01-29 NOTE — Progress Notes (Signed)
Pre HD Tx   01/29/19 1045  Hand-Off documentation  Report given to (Full Name) Trellis Paganini RN  Report received from (Full Name) [facility] RN  Vital Signs  Temp 98.3 F (36.8 C)  Temp Source Oral  Pulse Rate 93  Pulse Rate Source Monitor  Resp 16  BP (!) 158/90  BP Location Right Arm  BP Method Automatic  Patient Position (if appropriate) Lying  Oxygen Therapy  SpO2 94 %  O2 Device Room Air  Pain Assessment  Pain Scale 0-10  Pain Score 0  Dialysis Weight  Weight 51 kg  Type of Weight Pre-Dialysis  Time-Out for Hemodialysis  What Procedure? Hemodialysis  Pt Identifiers(min of two) First/Last Name;MRN/Account#  Correct Site? Yes  Correct Side? Yes  Correct Procedure? Yes  Consents Verified? Yes  Rad Studies Available? N/A  Safety Precautions Reviewed? Yes  Engineer, civil (consulting) Number 1  Station Number 3  UF/Alarm Test Passed  Conductivity: Meter 14  Conductivity: Machine  14  pH 7.4  Reverse Osmosis Main  Normal Saline Lot Number [id number]  Dialyzer Lot Number [id number]  Disposable Set Lot Number [id number]  Machine Temperature 98.6 F (37 C)  Musician and Audible Yes  Blood Lines Intact and Secured Yes  Pre Treatment Patient Checks  Vascular access used during treatment Catheter  Hepatitis B Surface Antigen Results Negative  Date Hepatitis B Surface Antigen Drawn 01/24/19  Isolation Initiated Yes  Hepatitis B Surface Antibody  (<10)  Date Hepatitis B Surface Antibody Drawn 01/24/19  Hemodialysis Consent Verified Yes  Hemodialysis Standing Orders Initiated Yes  ECG (Telemetry) Monitor On Yes  Prime Ordered Normal Saline  Length of  DialysisTreatment -hour(s) 3.5 Hour(s)  Dialysis Treatment Comments Na 140  Dialyzer Elisio 17H NR  Dialysate 3K, 2.5 Ca  Dialysate Flow Ordered 600  Blood Flow Rate Ordered 400 mL/min  Ultrafiltration Goal 2 Liters  Dialysis Blood Pressure Support Ordered Normal Saline  Education / Care Plan  Dialysis  Education Provided Yes  Documented Education in Care Plan Yes  Fistula / Graft Left Upper arm Arteriovenous vein graft  Placement Date/Time: 12/26/18 1024   Placed prior to admission: No  Orientation: Left  Access Location: Upper arm  Access Type: (c) Arteriovenous vein graft  Site Condition No complications  Fistula / Graft Assessment Present;Thrill;Bruit  Drainage Description None  Hemodialysis Catheter Right Subclavian  No Placement Date or Time found.   Placed prior to admission: Yes  Orientation: Right  Access Location: Subclavian  Site Condition No complications  Blue Lumen Status Capped (Central line)  Red Lumen Status Capped (Central line)  Purple Lumen Status N/A  Dressing Type Biopatch;Occlusive  Dressing Status Clean;Dry  Interventions Dressing changed  Drainage Description None  Dressing Change Due 02/05/19

## 2019-01-29 NOTE — Progress Notes (Signed)
HD Tx Start    01/29/19 1058  Vital Signs  Pulse Rate 95  Resp 18  BP (!) 158/90  Oxygen Therapy  SpO2 95 %  Pain Assessment  Pain Scale 0-10  Pain Score 0  Faces Pain Scale 0  During Hemodialysis Assessment  Blood Flow Rate (mL/min) 400 mL/min  Arterial Pressure (mmHg) -160 mmHg  Venous Pressure (mmHg) 150 mmHg  Transmembrane Pressure (mmHg) 40 mmHg  Ultrafiltration Rate (mL/min) 710 mL/min (710 mL per HOUR)  Dialysate Flow Rate (mL/min) 600 ml/min  Conductivity: Machine  14  HD Safety Checks Performed Yes  Dialysis Fluid Bolus Normal Saline  Bolus Amount (mL) 250 mL  Intra-Hemodialysis Comments Tx initiated  Hemodialysis Catheter Right Subclavian  No Placement Date or Time found.   Placed prior to admission: Yes  Orientation: Right  Access Location: Subclavian  Site Condition No complications  Blue Lumen Status Infusing  Red Lumen Status Infusing  Dressing Type Biopatch;Occlusive  Dressing Status Clean;Dry;Intact  Interventions New dressing  Drainage Description None

## 2019-01-29 NOTE — Progress Notes (Signed)
Daichi Moris to be D/C'd [facility] per MD order.  Discussed prescriptions and follow up appointments with the patient. Prescriptions given to patient, medication list explained in detail. Pt verbalized understanding.  Allergies as of 01/29/2019      Reactions   Codeine Nausea Only   Patient questioned this (??)   Sulfa Antibiotics Hives, Nausea And Vomiting      Medication List    STOP taking these medications   ibuprofen 400 MG tablet Commonly known as:  ADVIL,MOTRIN     TAKE these medications   amLODipine 5 MG tablet Commonly known as:  NORVASC Take 1 tablet (5 mg total) by mouth daily at 8 pm.   apixaban 2.5 MG Tabs tablet Commonly known as:  ELIQUIS Take 1 tablet (2.5 mg total) by mouth 2 (two) times daily.   aspirin EC 81 MG tablet Take 1 tablet (81 mg total) by mouth daily.   atorvastatin 80 MG tablet Commonly known as:  LIPITOR Take 1 tablet (80 mg total) by mouth at bedtime.   carvedilol 25 MG tablet Commonly known as:  COREG Take 1 tablet (25 mg total) by mouth 2 (two) times daily with a meal.   escitalopram 10 MG tablet Commonly known as:  LEXAPRO Take 1 tablet (10 mg total) by mouth daily.   gabapentin 100 MG capsule Commonly known as:  NEURONTIN Take 1 capsule (100 mg total) by mouth 3 (three) times daily.   hydrALAZINE 25 MG tablet Commonly known as:  APRESOLINE Take 1 tablet (25 mg total) by mouth every 8 (eight) hours.   hydrOXYzine 25 MG tablet Commonly known as:  ATARAX/VISTARIL Take 25 mg by mouth 3 (three) times daily as needed for anxiety.   multivitamin Tabs tablet Take 1 tablet by mouth daily.   neomycin-bacitracin-polymyxin Oint Commonly known as:  NEOSPORIN Apply 1 application topically 2 (two) times daily.   sevelamer carbonate 800 MG tablet Commonly known as:  RENVELA Take 2 tablets (1,600 mg total) by mouth 3 (three) times daily with meals.       Vitals:   01/29/19 1500 01/29/19 1556  BP: (!) 161/98 (!) 163/67   Pulse: 99 (!) 103  Resp: 16   Temp:  98.1 F (36.7 C)  SpO2: 95% 96%    Skin clean, dry and intact without evidence of skin break down, no evidence of skin tears noted. IV catheter discontinued intact. Site without signs and symptoms of complications. Dressing and pressure applied. Pt denies pain at this time. No complaints noted.  An After Visit Summary was printed and given to the patient. Patient escorted via [place], and D/C to [facility] via [place].  Chuck Hint RN [facility] [employer]

## 2019-01-29 NOTE — TOC Progression Note (Signed)
Transition of Care [facility]) - Progression Note    Patient Details  Name: Brett Small MRN: [id number] Date of Birth: 29-Mar-1976  [facility] CM/SW Contact  Shela Leff, [place] Phone Number: 01/29/2019, 11:17 AM  Clinical Narrative:   CSW informed by Estill Bamberg, the dialysis coordinator, that she was able to find a dialysis center in [place]: [facility] [address], Tue,Th,Sat at 11:00. Estill Bamberg spoke with the patient and he is going to stay in the [facility] which is close to the dialysis center. The dialysis center is already working on transportation for patient. A taxi voucher will be provided to get him to the [facility] when he discharges.     Expected Discharge Plan: Home/Self Care Barriers to Discharge: Continued Medical Work up  Expected Discharge Plan and Services Expected Discharge Plan: Home/Self Care In-house Referral: Clinical Social Work Discharge Planning Services: CM Consult   Living arrangements for the past 2 months: No permanent address([facility] in [place])                           Social Determinants of Health ([place]) Interventions    Readmission Risk Interventions No flowsheet data found.

## 2019-01-29 NOTE — Progress Notes (Deleted)
Pre HD Assessment   01/29/19 1028  Neurological  Level of Consciousness Alert  Orientation Level Oriented X4  Respiratory  Respiratory Pattern Regular  Chest Assessment Chest expansion symmetrical  Bilateral Breath Sounds Diminished;Expiratory wheezes  Cardiac  Pulse Regular  Heart Sounds S1, S2  ECG Monitor Yes  Cardiac Rhythm NSR  Vascular  R Radial Pulse +2  L Radial Pulse +2  Integumentary  Integumentary (WDL) X  Skin Color Appropriate for ethnicity  Skin Condition Dry  Musculoskeletal  Musculoskeletal (WDL) WDL  Gastrointestinal  Bowel Sounds Assessment Active  GU Assessment  Genitourinary (WDL) X  Genitourinary Symptoms Oliguria (HD pt. )  Psychosocial  Psychosocial (WDL) X  Patient Behaviors Cooperative;Calm  Emotional support given Given to patient

## 2019-01-29 NOTE — Progress Notes (Signed)
[facility] has denied patient referral. Patient accepted at [place] [facility] 11:00, clinic willing to start on Saturday to avoid delay in discharge.

## 2019-01-29 NOTE — Progress Notes (Signed)
Post HD Tx   2 liters removed during dialysis. Pt tolerated tx well - to be discharged today. Will begin receiving HD outpatient at [facility]).     01/29/19 1500  Vital Signs  Pulse Rate 99  Pulse Rate Source Monitor  Resp 16  BP (!) 161/98  BP Location Right Arm  BP Method Automatic  Patient Position (if appropriate) Lying  Oxygen Therapy  SpO2 95 %  O2 Device Room Air  Pain Assessment  Pain Scale 0-10  Pain Score 0  Dialysis Weight  Weight 49 kg  Type of Weight Post-Dialysis  Post-Hemodialysis Assessment  Rinseback Volume (mL) 250 mL  KECN 74.7 V  Dialyzer Clearance Lightly streaked  Duration of HD Treatment -hour(s) 3.5 hour(s)  Hemodialysis Intake (mL) 500 mL  UF Total -Machine (mL) 2500 mL  Net UF (mL) 2000 mL  Tolerated HD Treatment Yes  Hemodialysis Catheter Right Subclavian  No Placement Date or Time found.   Placed prior to admission: Yes  Orientation: Right  Access Location: Subclavian  Site Condition No complications  Blue Lumen Status Flushed;Saline locked;Capped (Central line);Heparin locked  Red Lumen Status Flushed;Saline locked;Capped (Central line);Heparin locked  Purple Lumen Status N/A  Catheter fill solution Heparin 1000 units/ml  Catheter fill volume (Arterial) 1.7 cc  Catheter fill volume (Venous) 1.7  Dressing Type Biopatch (tegaderm)  Dressing Status Clean;Dry;Intact;Dressing changed;Antimicrobial disc changed  Interventions New dressing  Drainage Description None  Post treatment catheter status Capped and Clamped

## 2019-01-29 NOTE — Progress Notes (Signed)
Post HD Assessment  2 liters removed during dialysis. Pt tolerated tx well - to be discharged today. Will begin receiving HD outpatient at [facility]).     01/29/19 1500  Neurological  Level of Consciousness Alert  Orientation Level Oriented X4  Respiratory  Respiratory Pattern Regular  Chest Assessment Chest expansion symmetrical  Bilateral Breath Sounds Diminished;Expiratory wheezes  R Lower Breath Sounds Fine crackles  L Lower Breath Sounds Fine crackles  Cardiac  Pulse Regular  Heart Sounds S1, S2  ECG Monitor Yes  Cardiac Rhythm NSR  Vascular  R Radial Pulse +2  L Radial Pulse +2  Integumentary  Integumentary (WDL) X  Skin Color Appropriate for ethnicity  Skin Condition Dry  Musculoskeletal  Musculoskeletal (WDL) WDL  Gastrointestinal  Bowel Sounds Assessment Active  GU Assessment  Genitourinary (WDL) X  Genitourinary Symptoms Oliguria (HD pt. )  Psychosocial  Psychosocial (WDL) X  Patient Behaviors Cooperative;Calm  Emotional support given Given to patient

## 2019-01-29 NOTE — Progress Notes (Signed)
Pre HD Tx Assessment   01/29/19 1028  Neurological  Level of Consciousness Alert  Orientation Level Oriented X4  Respiratory  Respiratory Pattern Regular  Chest Assessment Chest expansion symmetrical  Bilateral Breath Sounds Diminished;Expiratory wheezes  R Lower Breath Sounds Fine crackles  L Lower Breath Sounds Fine crackles  Cardiac  Pulse Regular  Heart Sounds S1, S2  ECG Monitor Yes  Cardiac Rhythm NSR  Vascular  R Radial Pulse +2  L Radial Pulse +2  Integumentary  Integumentary (WDL) X  Skin Color Appropriate for ethnicity  Skin Condition Dry  Musculoskeletal  Musculoskeletal (WDL) WDL  Gastrointestinal  Bowel Sounds Assessment Active  GU Assessment  Genitourinary (WDL) X  Genitourinary Symptoms Oliguria (HD pt. )  Psychosocial  Psychosocial (WDL) X  Patient Behaviors Cooperative;Calm  Emotional support given Given to patient

## 2019-01-29 NOTE — Progress Notes (Signed)
HD Tx End    01/29/19 1445  Hand-Off documentation  Report given to (Full Name) Thelma Comp RN  Report received from (Full Name) Trellis Paganini RN  Vital Signs  Temp 98.4 F (36.9 C)  Temp Source Oral  Pulse Rate 100  Pulse Rate Source Monitor  Resp 16  BP (!) 161/97  BP Location Right Arm  BP Method Automatic  Patient Position (if appropriate) Lying  Oxygen Therapy  SpO2 96 %  O2 Device Room Air  Pain Assessment  Pain Scale 0-10  Pain Score 0  During Hemodialysis Assessment  Blood Flow Rate (mL/min) 200 mL/min  Arterial Pressure (mmHg) -170 mmHg  Venous Pressure (mmHg) 100 mmHg  Transmembrane Pressure (mmHg) 60 mmHg  Ultrafiltration Rate (mL/min) 710 mL/min  Dialysate Flow Rate (mL/min) 600 ml/min  Conductivity: Machine  14  HD Safety Checks Performed Yes  Dialysis Fluid Bolus Normal Saline  Bolus Amount (mL) 250 mL  Intra-Hemodialysis Comments Tx completed

## 2019-01-30 NOTE — Discharge Summary (Signed)
[place] at [place] NAME: Bernhard Koskinen    MR#:  [id number]  DATE OF BIRTH:  07-Dec-1975  DATE OF ADMISSION:  01/27/2019 ADMITTING PHYSICIAN: Harrie Foreman, MD  DATE OF DISCHARGE: 01/29/2019  5:48 PM  PRIMARY CARE PHYSICIAN: Patient, [name]   ADMISSION DIAGNOSIS:  Respiratory Failure  DISCHARGE DIAGNOSIS:  Active Problems:   Acute respiratory failure with hypoxemia ([place])   SECONDARY DIAGNOSIS:   Past Medical History:  Diagnosis Date  . Hypertension   . Renal disorder      ADMITTING HISTORY  Chief Complaint: Shortness of breath HPI: The patient with past medical history of hypertension and ESRD on dialysis is admitted to the hospitalist from the behavioral health unit due to respiratory failure.  The patient's vital signs have been stable throughout the night when he was found to be in respiratory distress.  Oxygen saturations were in the low 80s and the patient had increased work of breathing.  He denies chest pain.  A nonrebreather mask was applied and the patient's oxygen saturations improved to 94%.  As the behavioral health unit does not have facilities and nursing ratio for acute care the patient was transferred to the hospitalist service for increased level of care.  HOSPITAL COURSE:   This is a 43 year old male admitted for respiratory failure  1.  Acute hypoxic respiratory failure secondary to pulmonary edema and fluid accumulation from missing hemodialysis and noncompliance. Patient had 2 sessions of hemodialysis in the hospital with fluid removed.  His hypoxic respiratory failure resolved and he was saturating 95% on room air.  No further trouble breathing or chest congestion.  Discharged home after being counseled to be compliant with hemodialysis.  2. Hypertension: Uncontrolled; applied Nitropaste and administer hydralazine IV. Later he was started on his home medications and blood pressure much improved.  3.  ESRD: On hemodialysis Patient recently lost his outpatient hemodialysis spot.  Social worker has coordinated with hemodialysis coordinator and patient has a spot available for Tuesday, Thursday, Saturday schedule as outpatient.  4. Depression: Continue home medications  Patient discharged home in stable condition to follow-up with nephrology.  CONSULTS OBTAINED:  Treatment Team:  Lavonia Dana, MD  DRUG ALLERGIES:   Allergies  Allergen Reactions  . Codeine Nausea Only    Patient questioned this (??)  . Sulfa Antibiotics Hives and Nausea And Vomiting    DISCHARGE MEDICATIONS:   Allergies as of 01/29/2019      Reactions   Codeine Nausea Only   Patient questioned this (??)   Sulfa Antibiotics Hives, Nausea And Vomiting      Medication List    STOP taking these medications   ibuprofen 400 MG tablet Commonly known as:  ADVIL,MOTRIN     TAKE these medications   amLODipine 5 MG tablet Commonly known as:  NORVASC Take 1 tablet (5 mg total) by mouth daily at 8 pm.   apixaban 2.5 MG Tabs tablet Commonly known as:  ELIQUIS Take 1 tablet (2.5 mg total) by mouth 2 (two) times daily.   aspirin EC 81 MG tablet Take 1 tablet (81 mg total) by mouth daily.   atorvastatin 80 MG tablet Commonly known as:  LIPITOR Take 1 tablet (80 mg total) by mouth at bedtime.   carvedilol 25 MG tablet Commonly known as:  COREG Take 1 tablet (25 mg total) by mouth 2 (two) times daily with a meal.   escitalopram 10 MG tablet Commonly known as:  LEXAPRO  Take 1 tablet (10 mg total) by mouth daily.   gabapentin 100 MG capsule Commonly known as:  NEURONTIN Take 1 capsule (100 mg total) by mouth 3 (three) times daily.   hydrALAZINE 25 MG tablet Commonly known as:  APRESOLINE Take 1 tablet (25 mg total) by mouth every 8 (eight) hours.   hydrOXYzine 25 MG tablet Commonly known as:  ATARAX/VISTARIL Take 25 mg by mouth 3 (three) times daily as needed for anxiety.   multivitamin Tabs  tablet Take 1 tablet by mouth daily.   neomycin-bacitracin-polymyxin Oint Commonly known as:  NEOSPORIN Apply 1 application topically 2 (two) times daily.   sevelamer carbonate 800 MG tablet Commonly known as:  RENVELA Take 2 tablets (1,600 mg total) by mouth 3 (three) times daily with meals.       Today   VITAL SIGNS:  Blood pressure (!) 163/67, pulse (!) 103, temperature 98.1 F (36.7 C), temperature source Oral, resp. rate 16, height 5\' 1"  (1.549 m), weight 49 kg, SpO2 96 %.  I/O:    Intake/Output Summary (Last 24 hours) at 01/30/2019 1428 Last data filed at 01/29/2019 1500 Gross per 24 hour  Intake -  Output 2000 ml  Net -2000 ml    PHYSICAL EXAMINATION:  Physical Exam  GENERAL:  43 y.o.-year-old patient lying in the bed with no acute distress.  LUNGS: Normal breath sounds bilaterally, no wheezing, rales,rhonchi or crepitation. No use of accessory muscles of respiration.  CARDIOVASCULAR: S1, S2 normal. No murmurs, rubs, or gallops.  ABDOMEN: Soft, non-tender, non-distended. Bowel sounds present. No organomegaly or mass.  NEUROLOGIC: Moves all 4 extremities. PSYCHIATRIC: The patient is alert and oriented x 3.  SKIN: No obvious rash, lesion, or ulcer.   DATA REVIEW:   CBC Recent Labs  Lab 01/27/19 0713  WBC 13.2*  HGB 10.6*  HCT 35.0*  PLT 232    Chemistries  Recent Labs  Lab 01/27/19 0713 01/28/19 0922  NA 144 138  K 5.8* 4.3  CL 106 101  CO2 25 29  GLUCOSE 158* 194*  BUN 59* 41*  CREATININE 8.39* 6.40*  CALCIUM 8.1* 8.0*  AST 17  --   ALT 10  --   ALKPHOS 71  --   BILITOT 0.7  --     Cardiac Enzymes Recent Labs  Lab 01/27/19 0713  TROPONINI <0.03    Microbiology Results  Results for orders placed or performed during the hospital encounter of 01/27/19  MRSA PCR Screening     Status: Abnormal   Collection Time: 01/27/19  9:55 AM  Result Value Ref Range Status   MRSA by PCR POSITIVE (A) NEGATIVE Final    Comment:        The  GeneXpert MRSA Assay (FDA approved for NASAL specimens only), is one component of a comprehensive MRSA colonization surveillance program. It is not intended to diagnose MRSA infection nor to guide or monitor treatment for MRSA infections. RESULT CALLED TO, READ BACK BY AND VERIFIED WITH: Nolberto Hanlon 01/27/19 1109 KLW Performed at [facility], [address], [place], [place] [address]     RADIOLOGY:  No results found.  Follow up with PCP in 1 week.  Management plans discussed with the patient, family and they are in agreement.  CODE STATUS:  Code Status History    Date Active Date Inactive Code Status Order ID Comments User Context   01/27/2019 0654 01/29/2019 2049 Partial Code [id number]  Harrie Foreman, MD Inpatient   01/07/2019 2156 01/27/2019 3536  Partial Code [id number]  Tennis Ship, MD Inpatient   12/25/2018 0158 01/07/2019 2027 Partial Code [id number]  Ina Homes, MD ED    Questions for Most Recent Historical Code Status (Order [id number])    Question Answer Comment   In the event of cardiac or respiratory ARREST: Initiate Code Blue, Call Rapid Response No    In the event of cardiac or respiratory ARREST: Perform CPR No    In the event of cardiac or respiratory ARREST: Perform Intubation/Mechanical Ventilation No    In the event of cardiac or respiratory ARREST: Use NIPPV/BiPAp only if indicated Yes    In the event of cardiac or respiratory ARREST: Administer ACLS medications if indicated No    In the event of cardiac or respiratory ARREST: Perform Defibrillation or Cardioversion if indicated No       TOTAL TIME TAKING CARE OF THIS PATIENT ON DAY OF DISCHARGE: more than 30 minutes.   Leia Alf Anjali Manzella M.D on 01/30/2019 at 2:28 PM  Between 7am to 6pm - Pager - [phone]  After 6pm go to [email or web address] - password [email or web address] [facility]  Office  [phone]  CC: Primary care physician; Patient, [name]  Note: This dictation was  prepared with Dragon dictation along with smaller phrase technology. Any transcriptional errors that result from this process are unintentional.

## 2019-02-04 ENCOUNTER — Emergency Department: Payer: Medicare Other

## 2019-02-04 ENCOUNTER — Inpatient Hospital Stay
Admission: EM | Admit: 2019-02-04 | Discharge: 2019-02-09 | DRG: 252 | Disposition: A | Discharge status: Home / Self Care | Payer: Medicare Other | Attending: Internal Medicine | Admitting: Internal Medicine

## 2019-02-04 DIAGNOSIS — Z992 Dependence on renal dialysis: Secondary | ICD-10-CM | POA: Diagnosis not present

## 2019-02-04 DIAGNOSIS — R7881 Bacteremia: Secondary | ICD-10-CM | POA: Diagnosis present

## 2019-02-04 DIAGNOSIS — Z7901 Long term (current) use of anticoagulants: Secondary | ICD-10-CM | POA: Diagnosis not present

## 2019-02-04 DIAGNOSIS — Z833 Family history of diabetes mellitus: Secondary | ICD-10-CM

## 2019-02-04 DIAGNOSIS — I2489 Other forms of acute ischemic heart disease: Secondary | ICD-10-CM

## 2019-02-04 DIAGNOSIS — I5031 Acute diastolic (congestive) heart failure: Secondary | ICD-10-CM | POA: Diagnosis present

## 2019-02-04 DIAGNOSIS — Z9115 Patient's noncompliance with renal dialysis: Secondary | ICD-10-CM | POA: Diagnosis not present

## 2019-02-04 DIAGNOSIS — D649 Anemia, unspecified: Secondary | ICD-10-CM | POA: Diagnosis present

## 2019-02-04 DIAGNOSIS — B9689 Other specified bacterial agents as the cause of diseases classified elsewhere: Secondary | ICD-10-CM | POA: Diagnosis not present

## 2019-02-04 DIAGNOSIS — F329 Major depressive disorder, single episode, unspecified: Secondary | ICD-10-CM | POA: Diagnosis present

## 2019-02-04 DIAGNOSIS — J9690 Respiratory failure, unspecified, unspecified whether with hypoxia or hypercapnia: Secondary | ICD-10-CM

## 2019-02-04 DIAGNOSIS — N186 End stage renal disease: Secondary | ICD-10-CM | POA: Diagnosis present

## 2019-02-04 DIAGNOSIS — Z20828 Contact with and (suspected) exposure to other viral communicable diseases: Secondary | ICD-10-CM | POA: Diagnosis present

## 2019-02-04 DIAGNOSIS — I959 Hypotension, unspecified: Secondary | ICD-10-CM | POA: Diagnosis present

## 2019-02-04 DIAGNOSIS — N2581 Secondary hyperparathyroidism of renal origin: Secondary | ICD-10-CM | POA: Diagnosis present

## 2019-02-04 DIAGNOSIS — Z885 Allergy status to narcotic agent status: Secondary | ICD-10-CM

## 2019-02-04 DIAGNOSIS — K219 Gastro-esophageal reflux disease without esophagitis: Secondary | ICD-10-CM | POA: Diagnosis present

## 2019-02-04 DIAGNOSIS — R0600 Dyspnea, unspecified: Secondary | ICD-10-CM | POA: Diagnosis not present

## 2019-02-04 DIAGNOSIS — J9600 Acute respiratory failure, unspecified whether with hypoxia or hypercapnia: Secondary | ICD-10-CM | POA: Diagnosis not present

## 2019-02-04 DIAGNOSIS — Z8249 Family history of ischemic heart disease and other diseases of the circulatory system: Secondary | ICD-10-CM

## 2019-02-04 DIAGNOSIS — I132 Hypertensive heart and chronic kidney disease with heart failure and with stage 5 chronic kidney disease, or end stage renal disease: Secondary | ICD-10-CM | POA: Diagnosis present

## 2019-02-04 DIAGNOSIS — B957 Other staphylococcus as the cause of diseases classified elsewhere: Secondary | ICD-10-CM | POA: Diagnosis not present

## 2019-02-04 DIAGNOSIS — E46 Unspecified protein-calorie malnutrition: Secondary | ICD-10-CM | POA: Diagnosis present

## 2019-02-04 DIAGNOSIS — J810 Acute pulmonary edema: Secondary | ICD-10-CM | POA: Diagnosis not present

## 2019-02-04 DIAGNOSIS — R45851 Suicidal ideations: Secondary | ICD-10-CM | POA: Diagnosis present

## 2019-02-04 DIAGNOSIS — I252 Old myocardial infarction: Secondary | ICD-10-CM

## 2019-02-04 DIAGNOSIS — Z89422 Acquired absence of other left toe(s): Secondary | ICD-10-CM | POA: Diagnosis not present

## 2019-02-04 DIAGNOSIS — Z681 Body mass index (BMI) 19 or less, adult: Secondary | ICD-10-CM | POA: Diagnosis not present

## 2019-02-04 DIAGNOSIS — D631 Anemia in chronic kidney disease: Secondary | ICD-10-CM | POA: Diagnosis present

## 2019-02-04 DIAGNOSIS — J811 Chronic pulmonary edema: Secondary | ICD-10-CM | POA: Diagnosis not present

## 2019-02-04 DIAGNOSIS — F419 Anxiety disorder, unspecified: Secondary | ICD-10-CM | POA: Diagnosis present

## 2019-02-04 DIAGNOSIS — Z7982 Long term (current) use of aspirin: Secondary | ICD-10-CM

## 2019-02-04 DIAGNOSIS — E785 Hyperlipidemia, unspecified: Secondary | ICD-10-CM | POA: Diagnosis present

## 2019-02-04 DIAGNOSIS — Z79899 Other long term (current) drug therapy: Secondary | ICD-10-CM

## 2019-02-04 DIAGNOSIS — Z590 Homelessness: Secondary | ICD-10-CM

## 2019-02-04 DIAGNOSIS — Z9889 Other specified postprocedural states: Secondary | ICD-10-CM | POA: Diagnosis not present

## 2019-02-04 DIAGNOSIS — I739 Peripheral vascular disease, unspecified: Secondary | ICD-10-CM | POA: Diagnosis not present

## 2019-02-04 DIAGNOSIS — Z95820 Peripheral vascular angioplasty status with implants and grafts: Secondary | ICD-10-CM | POA: Diagnosis not present

## 2019-02-04 DIAGNOSIS — Z87891 Personal history of nicotine dependence: Secondary | ICD-10-CM | POA: Diagnosis not present

## 2019-02-04 DIAGNOSIS — Z882 Allergy status to sulfonamides status: Secondary | ICD-10-CM

## 2019-02-04 DIAGNOSIS — J9811 Atelectasis: Secondary | ICD-10-CM | POA: Diagnosis present

## 2019-02-04 DIAGNOSIS — Z832 Family history of diseases of the blood and blood-forming organs and certain disorders involving the immune mechanism: Secondary | ICD-10-CM

## 2019-02-04 DIAGNOSIS — D689 Coagulation defect, unspecified: Secondary | ICD-10-CM | POA: Diagnosis present

## 2019-02-04 DIAGNOSIS — J9601 Acute respiratory failure with hypoxia: Secondary | ICD-10-CM

## 2019-02-04 DIAGNOSIS — I120 Hypertensive chronic kidney disease with stage 5 chronic kidney disease or end stage renal disease: Secondary | ICD-10-CM | POA: Diagnosis not present

## 2019-02-04 DIAGNOSIS — Z9911 Dependence on respirator [ventilator] status: Secondary | ICD-10-CM | POA: Diagnosis not present

## 2019-02-04 DIAGNOSIS — R011 Cardiac murmur, unspecified: Secondary | ICD-10-CM | POA: Diagnosis not present

## 2019-02-04 DIAGNOSIS — Z95828 Presence of other vascular implants and grafts: Secondary | ICD-10-CM

## 2019-02-04 DIAGNOSIS — I248 Other forms of acute ischemic heart disease: Secondary | ICD-10-CM

## 2019-02-04 LAB — GLUCOSE, CAPILLARY: Glucose-Capillary: 183 mg/dL — ABNORMAL HIGH (ref 70–99)

## 2019-02-04 LAB — COMPREHENSIVE METABOLIC PANEL
ALT: 12 U/L (ref 0–44)
AST: 31 U/L (ref 15–41)
Albumin: 3.4 g/dL — ABNORMAL LOW (ref 3.5–5.0)
Alkaline Phosphatase: 82 U/L (ref 38–126)
Anion gap: 18 — ABNORMAL HIGH (ref 5–15)
BUN: 73 mg/dL — ABNORMAL HIGH (ref 6–20)
CO2: 18 mmol/L — ABNORMAL LOW (ref 22–32)
Calcium: 8.3 mg/dL — ABNORMAL LOW (ref 8.9–10.3)
Chloride: 106 mmol/L (ref 98–111)
Creatinine, Ser: 9.19 mg/dL — ABNORMAL HIGH (ref 0.61–1.24)
GFR calc Af Amer: 7 mL/min — ABNORMAL LOW (ref >60)
GFR calc non Af Amer: 6 mL/min — ABNORMAL LOW (ref >60)
Glucose, Bld: 158 mg/dL — ABNORMAL HIGH (ref 70–99)
Potassium: 3.8 mmol/L (ref 3.5–5.1)
Sodium: 142 mmol/L (ref 135–145)
Total Bilirubin: 1.3 mg/dL — ABNORMAL HIGH (ref 0.3–1.2)
Total Protein: 7.5 g/dL (ref 6.5–8.1)

## 2019-02-04 LAB — CBC WITH DIFFERENTIAL/PLATELET
Abs Immature Granulocytes: 0.06 10*3/uL (ref 0.00–0.07)
Basophils Absolute: 0.1 10*3/uL (ref 0.0–0.1)
Basophils Relative: 1 %
Eosinophils Absolute: 0.1 10*3/uL (ref 0.0–0.5)
Eosinophils Relative: 1 %
HCT: 34 % — ABNORMAL LOW (ref 39.0–52.0)
Hemoglobin: 10.8 g/dL — ABNORMAL LOW (ref 13.0–17.0)
Immature Granulocytes: 0 %
Lymphocytes Relative: 10 %
Lymphs Abs: 1.5 10*3/uL (ref 0.7–4.0)
MCH: 31.2 pg (ref 26.0–34.0)
MCHC: 31.8 g/dL (ref 30.0–36.0)
MCV: 98.3 fL (ref 80.0–100.0)
Monocytes Absolute: 0.8 10*3/uL (ref 0.1–1.0)
Monocytes Relative: 6 %
Neutro Abs: 11.8 10*3/uL — ABNORMAL HIGH (ref 1.7–7.7)
Neutrophils Relative %: 82 %
Platelets: 285 10*3/uL (ref 150–400)
RBC: 3.46 MIL/uL — ABNORMAL LOW (ref 4.22–5.81)
RDW: 18.3 % — ABNORMAL HIGH (ref 11.5–15.5)
WBC: 14.4 10*3/uL — ABNORMAL HIGH (ref 4.0–10.5)
nRBC: 0 % (ref 0.0–0.2)

## 2019-02-04 LAB — BLOOD GAS, ARTERIAL
Acid-base deficit: 4.1 mmol/L — ABNORMAL HIGH (ref 0.0–2.0)
Bicarbonate: 23 mmol/L (ref 20.0–28.0)
FIO2: 0.8
MECHVT: 500 mL
Mechanical Rate: 16
O2 Saturation: 96.1 %
PEEP: 5 cmH2O
Patient temperature: 37
RATE: 16 resp/min
pCO2 arterial: 49 mmHg — ABNORMAL HIGH (ref 32.0–48.0)
pH, Arterial: 7.28 — ABNORMAL LOW (ref 7.350–7.450)
pO2, Arterial: 93 mmHg (ref 83.0–108.0)

## 2019-02-04 LAB — CBC
HCT: 30.2 % — ABNORMAL LOW (ref 39.0–52.0)
Hemoglobin: 9.4 g/dL — ABNORMAL LOW (ref 13.0–17.0)
MCH: 30.7 pg (ref 26.0–34.0)
MCHC: 31.1 g/dL (ref 30.0–36.0)
MCV: 98.7 fL (ref 80.0–100.0)
Platelets: 182 10*3/uL (ref 150–400)
RBC: 3.06 MIL/uL — ABNORMAL LOW (ref 4.22–5.81)
RDW: 18.1 % — ABNORMAL HIGH (ref 11.5–15.5)
WBC: 10.7 10*3/uL — ABNORMAL HIGH (ref 4.0–10.5)
nRBC: 0 % (ref 0.0–0.2)

## 2019-02-04 LAB — PHOSPHORUS: Phosphorus: 6.7 mg/dL — ABNORMAL HIGH (ref 2.5–4.6)

## 2019-02-04 LAB — CREATININE, SERUM
Creatinine, Ser: 9.41 mg/dL — ABNORMAL HIGH (ref 0.61–1.24)
GFR calc Af Amer: 7 mL/min — ABNORMAL LOW (ref >60)
GFR calc non Af Amer: 6 mL/min — ABNORMAL LOW (ref >60)

## 2019-02-04 LAB — LACTIC ACID, PLASMA
Lactic Acid, Venous: 4.6 mmol/L (ref 0.5–1.9)
Lactic Acid, Venous: 1.4 mmol/L (ref 0.5–1.9)

## 2019-02-04 LAB — MRSA PCR SCREENING: MRSA by PCR: NEGATIVE

## 2019-02-04 LAB — TROPONIN I: Troponin I: 0.07 ng/mL (ref <0.03)

## 2019-02-04 LAB — SARS CORONAVIRUS 2 BY RT PCR (HOSPITAL ORDER, PERFORMED IN ~~LOC~~ HOSPITAL LAB): SARS Coronavirus 2: NEGATIVE

## 2019-02-04 MED ORDER — VANCOMYCIN VARIABLE DOSE PER UNSTABLE RENAL FUNCTION (PHARMACIST DOSING): Status: DC

## 2019-02-04 MED ORDER — ALTEPLASE 2 MG IJ SOLR: 2.0000 mg | Freq: Once | INTRAMUSCULAR | Status: DC | PRN

## 2019-02-04 MED ORDER — MORPHINE SULFATE (PF) 2 MG/ML IV SOLN: 1.0000 mg | INTRAVENOUS | Status: DC | PRN

## 2019-02-04 MED ORDER — PROPOFOL 1000 MG/100ML IV EMUL
0.0000 ug/kg/min | INTRAVENOUS | Status: DC
  Administered 2019-02-04 (×2): 50 ug/kg/min via INTRAVENOUS
  Administered 2019-02-04 – 2019-02-05 (×2): 30 ug/kg/min via INTRAVENOUS
  Administered 2019-02-05 (×2): 20 ug/kg/min via INTRAVENOUS
  Administered 2019-02-06: 25 ug/kg/min via INTRAVENOUS
  Filled 2019-02-04 (×6): qty 100

## 2019-02-04 MED ORDER — PENTAFLUOROPROP-TETRAFLUOROETH EX AERO
1.0000 "application " | INHALATION_SPRAY | CUTANEOUS | Status: DC | PRN
  Filled 2019-02-04: qty 30

## 2019-02-04 MED ORDER — SODIUM CHLORIDE 0.9 % IV SOLN
1.0000 g | Freq: Once | INTRAVENOUS | Status: AC
  Administered 2019-02-04: 1 g via INTRAVENOUS
  Filled 2019-02-04: qty 1

## 2019-02-04 MED ORDER — VANCOMYCIN HCL IN DEXTROSE 1-5 GM/200ML-% IV SOLN
1000.0000 mg | Freq: Once | INTRAVENOUS | Status: AC
  Administered 2019-02-04: 1000 mg via INTRAVENOUS
  Filled 2019-02-04: qty 200

## 2019-02-04 MED ORDER — ONDANSETRON HCL 4 MG/2ML IJ SOLN: 4.0000 mg | Freq: Four times a day (QID) | INTRAMUSCULAR | Status: DC | PRN

## 2019-02-04 MED ORDER — SODIUM CHLORIDE 0.9 % IV SOLN: 100.0000 mL | INTRAVENOUS | Status: DC | PRN

## 2019-02-04 MED ORDER — PROPOFOL 1000 MG/100ML IV EMUL
INTRAVENOUS | Status: AC
  Filled 2019-02-04: qty 100

## 2019-02-04 MED ORDER — ROCURONIUM BROMIDE 50 MG/5ML IV SOLN
70.0000 mg | Freq: Once | INTRAVENOUS | Status: AC
  Administered 2019-02-04: 70 mg via INTRAVENOUS
  Filled 2019-02-04: qty 7

## 2019-02-04 MED ORDER — ALBUTEROL SULFATE HFA 108 (90 BASE) MCG/ACT IN AERS
6.0000 | INHALATION_SPRAY | RESPIRATORY_TRACT | Status: DC | PRN
  Filled 2019-02-04: qty 6.7

## 2019-02-04 MED ORDER — ORAL CARE MOUTH RINSE
15.0000 mL | OROMUCOSAL | Status: DC
  Administered 2019-02-04 – 2019-02-06 (×17): 15 mL via OROMUCOSAL

## 2019-02-04 MED ORDER — CHLORHEXIDINE GLUCONATE 0.12% ORAL RINSE (MEDLINE KIT)
15.0000 mL | Freq: Two times a day (BID) | OROMUCOSAL | Status: DC
  Administered 2019-02-04 – 2019-02-06 (×4): 15 mL via OROMUCOSAL

## 2019-02-04 MED ORDER — ETOMIDATE 2 MG/ML IV SOLN
20.0000 mg | Freq: Once | INTRAVENOUS | Status: AC
  Administered 2019-02-04: 20 mg via INTRAVENOUS

## 2019-02-04 MED ORDER — HEPARIN SODIUM (PORCINE) 1000 UNIT/ML DIALYSIS
1000.0000 [IU] | INTRAMUSCULAR | Status: DC | PRN
  Administered 2019-02-05: 1000 [IU] via INTRAVENOUS_CENTRAL
  Filled 2019-02-04 (×2): qty 1

## 2019-02-04 MED ORDER — HEPARIN SODIUM (PORCINE) 5000 UNIT/ML IJ SOLN
5000.0000 [IU] | Freq: Three times a day (TID) | INTRAMUSCULAR | Status: DC
  Administered 2019-02-04 – 2019-02-05 (×3): 5000 [IU] via SUBCUTANEOUS
  Filled 2019-02-04 (×3): qty 1

## 2019-02-04 MED ORDER — LIDOCAINE HCL (PF) 1 % IJ SOLN
5.0000 mL | INTRAMUSCULAR | Status: DC | PRN
  Filled 2019-02-04: qty 5

## 2019-02-04 MED ORDER — METHYLPREDNISOLONE SODIUM SUCC 125 MG IJ SOLR
125.0000 mg | Freq: Once | INTRAMUSCULAR | Status: AC
  Administered 2019-02-04: 13:00:00 125 mg via INTRAVENOUS
  Filled 2019-02-04: qty 2

## 2019-02-04 MED ORDER — SODIUM CHLORIDE 0.9 % IV SOLN: 250.0000 mL | INTRAVENOUS | Status: DC | PRN

## 2019-02-04 MED ORDER — FENTANYL CITRATE (PF) 100 MCG/2ML IJ SOLN: 50.0000 ug | INTRAMUSCULAR | Status: DC | PRN

## 2019-02-04 MED ORDER — ACETAMINOPHEN 650 MG RE SUPP: 650.0000 mg | Freq: Four times a day (QID) | RECTAL | Status: DC | PRN

## 2019-02-04 MED ORDER — FENTANYL CITRATE (PF) 100 MCG/2ML IJ SOLN
INTRAMUSCULAR | Status: AC
  Administered 2019-02-04: 50 ug via INTRAVENOUS
  Filled 2019-02-04: qty 2

## 2019-02-04 MED ORDER — FENTANYL CITRATE (PF) 100 MCG/2ML IJ SOLN
100.0000 ug | Freq: Once | INTRAMUSCULAR | Status: AC
  Administered 2019-02-04: 100 ug via INTRAVENOUS

## 2019-02-04 MED ORDER — ALBUTEROL SULFATE (2.5 MG/3ML) 0.083% IN NEBU: 2.5000 mg | INHALATION_SOLUTION | RESPIRATORY_TRACT | Status: DC | PRN

## 2019-02-04 MED ORDER — CHLORHEXIDINE GLUCONATE CLOTH 2 % EX PADS
6.0000 | MEDICATED_PAD | Freq: Every day | CUTANEOUS | Status: DC
  Administered 2019-02-05: 6 via TOPICAL

## 2019-02-04 MED ORDER — ONDANSETRON HCL 4 MG PO TABS: 4.0000 mg | ORAL_TABLET | Freq: Four times a day (QID) | ORAL | Status: DC | PRN

## 2019-02-04 MED ORDER — VANCOMYCIN HCL 500 MG IV SOLR
500.0000 mg | Freq: Once | INTRAVENOUS | Status: AC
  Administered 2019-02-04: 500 mg via INTRAVENOUS
  Filled 2019-02-04: qty 500

## 2019-02-04 MED ORDER — FUROSEMIDE 10 MG/ML IJ SOLN
80.0000 mg | Freq: Once | INTRAMUSCULAR | Status: AC
  Administered 2019-02-04: 80 mg via INTRAVENOUS
  Filled 2019-02-04: qty 8

## 2019-02-04 MED ORDER — NOREPINEPHRINE 4 MG/250ML-% IV SOLN
0.0000 ug/min | INTRAVENOUS | Status: DC
  Administered 2019-02-04: 10 ug/min via INTRAVENOUS

## 2019-02-04 MED ORDER — SODIUM CHLORIDE 0.9% FLUSH
3.0000 mL | Freq: Two times a day (BID) | INTRAVENOUS | Status: DC
  Administered 2019-02-04 – 2019-02-09 (×10): 3 mL via INTRAVENOUS

## 2019-02-04 MED ORDER — SENNOSIDES 8.8 MG/5ML PO SYRP
5.0000 mL | ORAL_SOLUTION | Freq: Two times a day (BID) | ORAL | Status: DC | PRN
  Filled 2019-02-04: qty 5

## 2019-02-04 MED ORDER — FENTANYL CITRATE (PF) 100 MCG/2ML IJ SOLN
50.0000 ug | INTRAMUSCULAR | Status: DC | PRN
  Administered 2019-02-04: 20:00:00 50 ug via INTRAVENOUS

## 2019-02-04 MED ORDER — NOREPINEPHRINE 4 MG/250ML-% IV SOLN
INTRAVENOUS | Status: AC
  Filled 2019-02-04: qty 250

## 2019-02-04 MED ORDER — SODIUM CHLORIDE 0.9% FLUSH: 3.0000 mL | INTRAVENOUS | Status: DC | PRN

## 2019-02-04 MED ORDER — ACETAMINOPHEN 325 MG PO TABS: 650.0000 mg | ORAL_TABLET | Freq: Four times a day (QID) | ORAL | Status: DC | PRN

## 2019-02-04 MED ORDER — SODIUM CHLORIDE 0.9 % IV SOLN
1.0000 g | INTRAVENOUS | Status: DC
  Filled 2019-02-04: qty 1

## 2019-02-04 MED ORDER — LIDOCAINE-PRILOCAINE 2.5-2.5 % EX CREA
1.0000 "application " | TOPICAL_CREAM | CUTANEOUS | Status: DC | PRN
  Filled 2019-02-04: qty 5

## 2019-02-04 MED ORDER — LABETALOL HCL 5 MG/ML IV SOLN
10.0000 mg | Freq: Once | INTRAVENOUS | Status: AC
  Administered 2019-02-04: 14:00:00 10 mg via INTRAVENOUS
  Filled 2019-02-04: qty 4

## 2019-02-04 NOTE — H&P (Signed)
[place] at [place] NAME: Brett Small    MR#:  [id number]  DATE OF BIRTH:  19-Nov-1975  DATE OF ADMISSION:  02/04/2019  PRIMARY CARE PHYSICIAN: Patient, [name]   REQUESTING/REFERRING PHYSICIAN:   CHIEF COMPLAINT: Shortness of breath  HISTORY OF PRESENT ILLNESS: Brett Small  is a 43 y.o. male with a known history of end-stage renal disease on dialysis, hypertension missed dialysis for last 2 sessions.  Patient presented to the emergency room with increased shortness of breath unable to speak full sentences and oxygen saturation was 60%.  He was initially put on nonrebreather mask 100%.  Patient was electively intubated by ER physician put on ventilator.  Coronavirus test was also sent.  Patient put on airborne and contact precautions.  Nephrology consultant was informed for dialysis.  Case was also discussed with intensivist.  Patient also has elevated blood pressure initially when he came to the emergency room systolic blood pressure was more than 220 mmHg.  PAST MEDICAL HISTORY:   Past Medical History:  Diagnosis Date  . Hypertension   . Renal disorder     PAST SURGICAL HISTORY:  Past Surgical History:  Procedure Laterality Date  . A/V SHUNT INTERVENTION Left 01/19/2019   Procedure: LEFT UPPER EXTREMITY A/V SHUNTOGRAM / UPPER EXTREMITY ANGIOGRAM;  Surgeon: Algernon Huxley, MD;  Location: [place] [facility];  Service: Cardiovascular;  Laterality: Left;  . AORTA - FEMORAL ARTERY BYPASS GRAFT    . AV FISTULA PLACEMENT Left 12/26/2018   Procedure: INSERTION OF GORE STRETCH VASCULAR 4-7MM X  45CM IN LEFT UPPER ARM;  Surgeon: Marty Heck, MD;  Location: [place];  Service: Vascular;  Laterality: Left;    SOCIAL HISTORY:  Social History   Tobacco Use  . Smoking status: Former Smoker    Last attempt to quit: 06/26/2018    Years since quitting: 0.6  . Smokeless tobacco: Never Used  . Tobacco comment: smoked for 30 years    Substance Use Topics  . Alcohol use: Not Currently    FAMILY HISTORY:  Family History  Problem Relation Age of Onset  . Hypertension Other   . Diabetes Other   . Clotting disorder Father     DRUG ALLERGIES:  Allergies  Allergen Reactions  . Codeine Nausea Only    Patient questioned this (??)  . Sulfa Antibiotics Hives and Nausea And Vomiting    REVIEW OF SYSTEMS:   Could not be obtained as patient is critically ill and on ventilator MEDICATIONS AT HOME:  Prior to Admission medications   Medication Sig Start Date End Date Taking? Authorizing Provider  apixaban (ELIQUIS) 2.5 MG TABS tablet Take 1 tablet (2.5 mg total) by mouth 2 (two) times daily. 01/26/19  Yes Clapacs, Madie Reno, MD  cephALEXin (KEFLEX) 250 MG capsule Take 250 mg by mouth every 12 (twelve) hours. 01/31/19 02/06/19 Yes [provider]  sevelamer carbonate (RENVELA) 800 MG tablet Take 2 tablets (1,600 mg total) by mouth 3 (three) times daily with meals. 01/26/19  Yes Clapacs, Madie Reno, MD  amLODipine (NORVASC) 5 MG tablet Take 1 tablet (5 mg total) by mouth daily at 8 pm. 01/26/19   Clapacs, Madie Reno, MD  aspirin EC 81 MG tablet Take 1 tablet (81 mg total) by mouth daily. 01/26/19   Clapacs, Madie Reno, MD  atorvastatin (LIPITOR) 80 MG tablet Take 1 tablet (80 mg total) by mouth at bedtime. 01/26/19   Clapacs, Madie Reno, MD  carvedilol (COREG) 25 MG tablet Take 1 tablet (25 mg total) by mouth 2 (two) times daily with a meal. 01/26/19   Clapacs, Madie Reno, MD  escitalopram (LEXAPRO) 10 MG tablet Take 1 tablet (10 mg total) by mouth daily. 01/26/19   Clapacs, Madie Reno, MD  gabapentin (NEURONTIN) 100 MG capsule Take 1 capsule (100 mg total) by mouth 3 (three) times daily. 01/26/19   Clapacs, Madie Reno, MD  hydrALAZINE (APRESOLINE) 25 MG tablet Take 1 tablet (25 mg total) by mouth every 8 (eight) hours. 01/26/19   Clapacs, Madie Reno, MD  hydrOXYzine (ATARAX/VISTARIL) 25 MG tablet Take 25 mg by mouth 3 (three) times daily as needed for anxiety.     [provider]  multivitamin (RENA-VIT) TABS tablet Take 1 tablet by mouth daily. 01/27/19   Clapacs, Madie Reno, MD  neomycin-bacitracin-polymyxin (NEOSPORIN) OINT Apply 1 application topically 2 (two) times daily. 01/26/19   Clapacs, Madie Reno, MD      PHYSICAL EXAMINATION:   VITAL SIGNS: Blood pressure (!) 159/99, pulse 96, temperature 98.3 F (36.8 C), temperature source Axillary, resp. rate 20, height 5\' 1"  (1.549 m), weight 49 kg, SpO2 100 %.  GENERAL:  43 y.o.-year-old patient lying in the bed on ventilator EYES: Pupils equal, round, reactive to light and accommodation. No scleral icterus. Extraocular muscles intact.  HEENT: Head atraumatic, normocephalic. Oropharynx endotracheal tube noted NECK:  Supple, no jugular venous distention. No thyroid enlargement, no tenderness.  LUNGS: Decreased breath sounds bilaterally, bilateral Rales heard. No use of accessory muscles of respiration.  CARDIOVASCULAR: S1, S2 normal. No murmurs, rubs, or gallops.  ABDOMEN: Soft, nontender, nondistended. Bowel sounds present. No organomegaly or mass.  EXTREMITIES: No pedal edema, cyanosis, or clubbing.  NEUROLOGIC: Not oriented to time place and person On ventilator. PSYCHIATRIC: The patient is alert and oriented x none SKIN: No obvious rash, lesion, or ulcer.   LABORATORY PANEL:   CBC Recent Labs  Lab 02/04/19 1237  WBC 14.4*  HGB 10.8*  HCT 34.0*  PLT 285  MCV 98.3  MCH 31.2  MCHC 31.8  RDW 18.3*  LYMPHSABS 1.5  MONOABS 0.8  EOSABS 0.1  BASOSABS 0.1   ------------------------------------------------------------------------------------------------------------------  Chemistries  Recent Labs  Lab 02/04/19 1237  NA 142  K 3.8  CL 106  CO2 18*  GLUCOSE 158*  BUN 73*  CREATININE 9.19*  CALCIUM 8.3*  AST 31  ALT 12  ALKPHOS 82  BILITOT 1.3*   ------------------------------------------------------------------------------------------------------------------ estimated  creatinine clearance is 7 mL/min (A) (by C-G formula based on SCr of 9.19 mg/dL (H)). ------------------------------------------------------------------------------------------------------------------ No results for input(s): TSH, T4TOTAL, T3FREE, THYROIDAB in the last 72 hours.  Invalid input(s): FREET3   Coagulation profile No results for input(s): INR, PROTIME in the last 168 hours. ------------------------------------------------------------------------------------------------------------------- No results for input(s): DDIMER in the last 72 hours. -------------------------------------------------------------------------------------------------------------------  Cardiac Enzymes Recent Labs  Lab 02/04/19 1237  TROPONINI 0.07*   ------------------------------------------------------------------------------------------------------------------ Invalid input(s): POCBNP  ---------------------------------------------------------------------------------------------------------------  Urinalysis No results found for: COLORURINE, APPEARANCEUR, LABSPEC, PHURINE, GLUCOSEU, HGBUR, BILIRUBINUR, KETONESUR, PROTEINUR, UROBILINOGEN, NITRITE, LEUKOCYTESUR   RADIOLOGY: Dg Chest Portable 1 View  Result Date: 02/04/2019 CLINICAL DATA:  43 year old male with shortness of breath EXAM: PORTABLE CHEST 1 VIEW COMPARISON:  01/27/2019, 12/24/2010 FINDINGS: Cardiomediastinal silhouette unchanged. Similar appearance of mixed interstitial and airspace opacities throughout the lungs with mild thickening of the minor fissure. No large pleural effusion. No pneumothorax. Right IJ hemodialysis catheter is unchanged. Endotracheal tube terminates 5.8 cm above the carina. Gastric tube terminates within the  stomach. Stent graft of the left axillary vein/brachial vein IMPRESSION: Mixed interstitial and airspace opacities with the differential primarily including multifocal pneumonia (including atypical pathogens), and  edema. Endotracheal tube terminates suitably above the carina. Right IJ hemodialysis catheter terminates at the superior vena cava. Gastric tube terminates in the stomach. Electronically Signed   By: Corrie Mckusick D.O.   On: 02/04/2019 13:44    EKG: Orders placed or performed during the hospital encounter of 02/04/19  . ED EKG  . ED EKG    IMPRESSION AND PLAN: 43 year old male patient with a known history of end-stage renal disease on dialysis, hypertension missed dialysis for last 2 sessions.  Patient presented to the emergency room with increased shortness of breath unable to speak full sentences and oxygen saturation was 60%.    -Acute hypoxic respiratory failure Continue mechanical ventilator Admit patient to ICU Intensivist consult  -Acute pulmonary edema and fluid overload Secondary to missing dialysis Nephrology consult for dialysis  -Systemic inflammatory response syndrome Start patient on IV vancomycin and cefepime antibiotics Follow-up WBC count and cultures and lactic acid levels  -End-stage renal disease Nephrology consult for dialysis  -DVT prophylaxis subcu heparin  -Elevated troponin secondary to demand ischemia  All the records are reviewed and case discussed with ED provider. Management plans discussed with the patient, family and they are in agreement.  CODE STATUS:Full code    Code Status Orders  (From admission, onward)         Start     Ordered   02/04/19 1342  Full code  Continuous     02/04/19 1343        Code Status History    Date Active Date Inactive Code Status Order ID Comments User Context   01/27/2019 0654 01/29/2019 2049 Partial Code [id number]  Harrie Foreman, MD Inpatient   01/07/2019 2156 01/27/2019 0638 Partial Code [id number]  Tennis Ship, MD Inpatient   12/25/2018 0158 01/07/2019 2027 Partial Code [id number]  Ina Homes, MD ED       TOTAL TIME TAKING CARE OF THIS PATIENT: 52 minutes.    Saundra Shelling M.D on 02/04/2019 at  2:24 PM  Between 7am to 6pm - Pager - [phone]  After 6pm go to [email or web address] - password [email or web address] [facility]  Office  [phone]  CC: Primary care physician; Patient, [name]

## 2019-02-04 NOTE — Progress Notes (Signed)
HD Tx Start   02/04/19 1610  Vital Signs  Pulse Rate 88  Resp 19  BP 101/75  Oxygen Therapy  SpO2 99 %  During Hemodialysis Assessment  Blood Flow Rate (mL/min) 400 mL/min  Arterial Pressure (mmHg) -180 mmHg  Venous Pressure (mmHg) 200 mmHg  Transmembrane Pressure (mmHg) 40 mmHg  Ultrafiltration Rate (mL/min) 1000 mL/min  Dialysate Flow Rate (mL/min) 800 ml/min  Conductivity: Machine  14  HD Safety Checks Performed Yes  Dialysis Fluid Bolus Normal Saline  Bolus Amount (mL) 250 mL  Intra-Hemodialysis Comments Tx initiated  Hemodialysis Catheter Right Subclavian  No Placement Date or Time found.   Placed prior to admission: Yes  Orientation: Right  Access Location: Subclavian  Site Condition No complications  Blue Lumen Status Infusing  Red Lumen Status Infusing  Dressing Type Biopatch (tegaderm)  Dressing Status Clean;Dry;Intact;Dressing changed;Antimicrobial disc changed  Interventions Dressing changed;New dressing  Drainage Description None  Dressing Change Due 02/11/19

## 2019-02-04 NOTE — ED Triage Notes (Signed)
Missed dialysis yesterday present with increased SOB. Patient unable to speak full sentences, sating 60% as per ems. NRB placed sating 100% working to breath.

## 2019-02-04 NOTE — ED Notes (Signed)
RT at bedside to placed on HI-Flo o2

## 2019-02-04 NOTE — Consult Note (Signed)
Pharmacy Antibiotic Note  Brett Small is a 43 y.o. male admitted on 02/04/2019 with HAP.  Pharmacy has been consulted for Vancomycin and Cefepime dosing. Patient has already received a dose of Cefepime and Vancomycin in the ED. Patient receives dialysis.    Plan: Will start Cefepime 1 g Q24H.   Pt received vancomycin 1 g IV x1 loading dose. Based on pt wt, will need vancomycin 500 mg IV after each HD session. HD schedule unclear. Pt received full HD session today per RN. Will order vancomycin 500 mg IV x1 for tonight. Will need to follow up on HD schedule and order vancomycin doses accordingly.    Height: 5\' 2"  (157.5 cm) Weight: 100 lb 15.5 oz (45.8 kg) IBW/kg (Calculated) : 54.6  Temp (24hrs), Avg:97.9 F (36.6 C), Min:97.4 F (36.3 C), Max:98.3 F (36.8 C)  Recent Labs  Lab 02/04/19 1237 02/04/19 1526  WBC 14.4* 10.7*  CREATININE 9.19* 9.41*  LATICACIDVEN 4.6* 1.4    Estimated Creatinine Clearance: 6.4 mL/min (A) (by C-G formula based on SCr of 9.41 mg/dL (H)).    Allergies  Allergen Reactions  . Codeine Nausea Only    Patient questioned this (??)  . Sulfa Antibiotics Hives and Nausea And Vomiting    Antimicrobials this admission: 4/15 Cefepime >>  4/15 Vancomycin >>   Dose adjustments this admission: N/A  Microbiology results: 4/15 BCx: pending  4/15 COVID: NEGATIVE 4/15 MRSA PCR: pending   Thank you for allowing pharmacy to be a part of this patient's care.  Rocky Morel, PharmD 02/04/2019 7:53 PM

## 2019-02-04 NOTE — ED Notes (Signed)
ED TO INPATIENT HANDOFF REPORT  ED Nurse Name and Phone #:   S Name/Age/Gender Brett Small 43 y.o. male Room/Bed: ED01A/ED01A  Code Status   Code Status: Full Code  Home/SNF/Other Home {Patient oriented [phone]     Is this baseline?intubated      Chief Complaint difficulty breathing     Triage Note Missed dialysis yesterday present with increased SOB. Patient unable to speak full sentences, sating 60% as per ems. NRB placed sating 100% working to breath.    Allergies Allergies  Allergen Reactions  . Codeine Nausea Only    Patient questioned this (??)  . Sulfa Antibiotics Hives and Nausea And Vomiting    Level of Care/Admitting Diagnosis ED Disposition    ED Disposition Condition [facility] Area: [place] [[id number]]  Level of Care: ICU [6]  Diagnosis: Acute respiratory failure ([place]) [518.81.ICD-9-CM]  Admitting Physician: Saundra Shelling [[id number]]  Attending Physician: Saundra Shelling [[id number]]  Estimated length of stay: past midnight tomorrow  Certification:: I certify this patient will need inpatient services for at least 2 midnights  Possible Covid Disease Patient Isolation: High Risk (Aerosolizing procedure, nebulizer, intubated/ventilation, CPAP/BiPAP)  Bed request comments: covid test sent, pending result  PT Class (Do Not Modify): Inpatient [101]  PT Acc Code (Do Not Modify): Private [1]       B Medical/Surgery History Past Medical History:  Diagnosis Date  . Hypertension   . Renal disorder    Past Surgical History:  Procedure Laterality Date  . A/V SHUNT INTERVENTION Left 01/19/2019   Procedure: LEFT UPPER EXTREMITY A/V SHUNTOGRAM / UPPER EXTREMITY ANGIOGRAM;  Surgeon: Algernon Huxley, MD;  Location: [place] [facility];  Service: Cardiovascular;  Laterality: Left;  . AORTA - FEMORAL ARTERY BYPASS GRAFT    . AV FISTULA PLACEMENT Left 12/26/2018   Procedure: INSERTION OF GORE STRETCH VASCULAR 4-7MM X  45CM IN  LEFT UPPER ARM;  Surgeon: Marty Heck, MD;  Location: [place];  Service: Vascular;  Laterality: Left;     A IV Location/Drains/Wounds Patient Lines/Drains/Airways Status   Active Line/Drains/Airways    Name:   Placement date:   Placement time:   Site:   Days:   Peripheral IV 01/01/19 Right Forearm   01/01/19    1230    Forearm   34   Peripheral IV 02/04/19 Right Antecubital   02/04/19    1309    Antecubital   less than 1   Peripheral IV 02/04/19 Left External jugular   02/04/19    1309    External jugular   less than 1   Fistula / Graft Left Upper arm Arteriovenous vein graft   12/26/18    1024    Upper arm   40   Hemodialysis Catheter Right Subclavian   -    -    Subclavian      Airway 7.5 mm   02/04/19    1245     less than 1   Incision (Closed) 12/26/18 Arm Left   12/26/18    1024     40   Incision (Closed) 12/26/18 Arm Left   12/26/18    1024     40   Wound / Incision (Open or Dehisced) 12/25/18 Non-pressure wound Ankle Right;Lateral   12/25/18    1350    Ankle   41          Intake/Output Last 24 hours  Intake/Output Summary (Last 24 hours) at 02/04/2019  1428 Last data filed at 02/04/2019 1424 Gross per 24 hour  Intake 100 ml  Output -  Net 100 ml    Labs/Imaging Results for orders placed or performed during the hospital encounter of 02/04/19 (from the past 48 hour(s))  ABO/Rh     Status: None   Collection Time: 02/04/19 12:20 PM  Result Value Ref Range   ABO/RH(D)      AB POS Performed at [facility], [address] [place], [place] [address]   CBC with Differential/Platelet     Status: Abnormal   Collection Time: 02/04/19 12:37 PM  Result Value Ref Range   WBC 14.4 (H) 4.0 - 10.5 K/uL   RBC 3.46 (L) 4.22 - 5.81 MIL/uL   Hemoglobin 10.8 (L) 13.0 - 17.0 g/dL   HCT 34.0 (L) 39.0 - 52.0 %   MCV 98.3 80.0 - 100.0 fL   MCH 31.2 26.0 - 34.0 pg   MCHC 31.8 30.0 - 36.0 g/dL   RDW 18.3 (H) 11.5 - 15.5 %   Platelets 285 150 - 400 K/uL   nRBC 0.0 0.0 -  0.2 %   Neutrophils Relative % 82 %   Neutro Abs 11.8 (H) 1.7 - 7.7 K/uL   Lymphocytes Relative 10 %   Lymphs Abs 1.5 0.7 - 4.0 K/uL   Monocytes Relative 6 %   Monocytes Absolute 0.8 0.1 - 1.0 K/uL   Eosinophils Relative 1 %   Eosinophils Absolute 0.1 0.0 - 0.5 K/uL   Basophils Relative 1 %   Basophils Absolute 0.1 0.0 - 0.1 K/uL   Immature Granulocytes 0 %   Abs Immature Granulocytes 0.06 0.00 - 0.07 K/uL    Comment: Performed at [facility], [address] [place], [place] [address]  Lactic acid, plasma     Status: Abnormal   Collection Time: 02/04/19 12:37 PM  Result Value Ref Range   Lactic Acid, Venous 4.6 (HH) 0.5 - 1.9 mmol/L    Comment: CRITICAL RESULT CALLED TO, READ BACK BY AND VERIFIED WITH CALLED TO Alainah Phang PEREZ @1305  02/04/19 MU/MLK Performed at [facility], [address] [place], [place] [address]   Comprehensive metabolic panel     Status: Abnormal   Collection Time: 02/04/19 12:37 PM  Result Value Ref Range   Sodium 142 135 - 145 mmol/L   Potassium 3.8 3.5 - 5.1 mmol/L   Chloride 106 98 - 111 mmol/L   CO2 18 (L) 22 - 32 mmol/L   Glucose, Bld 158 (H) 70 - 99 mg/dL   BUN 73 (H) 6 - 20 mg/dL   Creatinine, Ser 9.19 (H) 0.61 - 1.24 mg/dL   Calcium 8.3 (L) 8.9 - 10.3 mg/dL   Total Protein 7.5 6.5 - 8.1 g/dL   Albumin 3.4 (L) 3.5 - 5.0 g/dL   AST 31 15 - 41 U/L   ALT 12 0 - 44 U/L   Alkaline Phosphatase 82 38 - 126 U/L   Total Bilirubin 1.3 (H) 0.3 - 1.2 mg/dL   GFR calc non Af Amer 6 (L) >60 mL/min   GFR calc Af Amer 7 (L) >60 mL/min   Anion gap 18 (H) 5 - 15    Comment: Performed at [facility], [address] [place], [place] [address]  Troponin I - ONCE - STAT     Status: Abnormal   Collection Time: 02/04/19 12:37 PM  Result Value Ref Range   Troponin I 0.07 (HH) <0.03 ng/mL    Comment: CRITICAL  RESULT CALLED TO, READ BACK BY AND VERIFIED WITH CALLED TO Samrat Hayward PEREZ @1313  02/04/19 MU/MLK Performed at [facility], [address] [place], [place] [address]   Blood gas, arterial     Status: Abnormal   Collection Time: 02/04/19  1:40 PM  Result Value Ref Range   FIO2 0.80    Delivery systems VENTILATOR    Mode ASSIST CONTROL    VT 500 mL   LHR 16 resp/min   Peep/cpap 5.0 cm H20   pH, Arterial 7.28 (L) 7.350 - 7.450   pCO2 arterial 49 (H) 32.0 - 48.0 mmHg   pO2, Arterial 93 83.0 - 108.0 mmHg   Bicarbonate 23.0 20.0 - 28.0 mmol/L   Acid-base deficit 4.1 (H) 0.0 - 2.0 mmol/L   O2 Saturation 96.1 %   Patient temperature 37.0    Collection site RIGHT RADIAL    Sample type ARTERIAL DRAW    Allens test (pass/fail) PASS PASS   Mechanical Rate 16     Comment: Performed at [facility], [address], [place], [place] [address]   Dg Chest Portable 1 View  Result Date: 02/04/2019 CLINICAL DATA:  43 year old male with shortness of breath EXAM: PORTABLE CHEST 1 VIEW COMPARISON:  01/27/2019, 12/24/2010 FINDINGS: Cardiomediastinal silhouette unchanged. Similar appearance of mixed interstitial and airspace opacities throughout the lungs with mild thickening of the minor fissure. No large pleural effusion. No pneumothorax. Right IJ hemodialysis catheter is unchanged. Endotracheal tube terminates 5.8 cm above the carina. Gastric tube terminates within the stomach. Stent graft of the left axillary vein/brachial vein IMPRESSION: Mixed interstitial and airspace opacities with the differential primarily including multifocal pneumonia (including atypical pathogens), and edema. Endotracheal tube terminates suitably above the carina. Right IJ hemodialysis catheter terminates at the superior vena cava. Gastric tube terminates in the stomach. Electronically Signed   By: Corrie Mckusick D.O.   On: 02/04/2019 13:44    Pending Labs Unresulted Labs (From admission, onward)    Start     Ordered   02/05/19 3151  Basic metabolic panel  Tomorrow morning,   STAT     02/04/19 1343   02/05/19 0500  CBC   Tomorrow morning,   STAT     02/04/19 1343   02/04/19 1341  CBC  (heparin)  Once,   STAT    Comments:  Baseline for heparin therapy IF NOT ALREADY DRAWN.  Notify MD if PLT < 100 K.    02/04/19 1343   02/04/19 1341  Creatinine, serum  (heparin)  Once,   STAT    Comments:  Baseline for heparin therapy IF NOT ALREADY DRAWN.    02/04/19 1343   02/04/19 1220  SARS Coronavirus 2 [facility] order, Performed in [facility] hospital lab)  (Novel Coronavirus, [facility] Order))  ONCE - STAT,   STAT     02/04/19 1220   02/04/19 1219  Blood culture (routine x 2)  BLOOD CULTURE X 2,   STAT     02/04/19 1218          Vitals/Pain Today's Vitals   02/04/19 1315 02/04/19 1330 02/04/19 1345 02/04/19 1356  BP: (!) 210/126 (!) 215/118 (!) 211/112 (!) 159/99  Pulse: (!) 149 (!) 140 (!) 134 96  Resp: 19 20 (!) 21 20  Temp:    98.3 F (36.8 C)  TempSrc:    Axillary  SpO2: 99% 95% 94% 100%  Weight:      Height:      PainSc:  0-No pain    Isolation Precautions Droplet and Contact precautions  Medications Medications  vancomycin (VANCOCIN) IVPB 1000 mg/200 mL premix (has no administration in time range)  heparin injection 5,000 Units (has no administration in time range)  sodium chloride flush (NS) 0.9 % injection 3 mL (3 mLs Intravenous Given 02/04/19 1355)  sodium chloride flush (NS) 0.9 % injection 3 mL (has no administration in time range)  0.9 %  sodium chloride infusion (has no administration in time range)  acetaminophen (TYLENOL) tablet 650 mg (has no administration in time range)    Or  acetaminophen (TYLENOL) suppository 650 mg (has no administration in time range)  ondansetron (ZOFRAN) tablet 4 mg (has no administration in time range)    Or  ondansetron (ZOFRAN) injection 4 mg (has no administration in time range)  albuterol (PROVENTIL) (2.5 MG/3ML) 0.083% nebulizer solution 2.5 mg (has no administration in time range)  furosemide (LASIX) injection 80 mg (80 mg Intravenous  Given 02/04/19 1311)  methylPREDNISolone sodium succinate (SOLU-MEDROL) 125 mg/2 mL injection 125 mg (125 mg Intravenous Given 02/04/19 1312)  ceFEPIme (MAXIPIME) 1 g in sodium chloride 0.9 % 100 mL IVPB (0 g Intravenous Stopped 02/04/19 1424)  labetalol (NORMODYNE,TRANDATE) injection 10 mg (10 mg Intravenous Given 02/04/19 1347)  etomidate (AMIDATE) injection 20 mg (20 mg Intravenous Given 02/04/19 1425)  rocuronium (ZEMURON) injection 70 mg (70 mg Intravenous Given 02/04/19 1424)    Mobility walks     Focused Assessments Pulmonary Assessment Handoff:  Lung sounds:   O2 Device: Ventilator        R Recommendations: See Admitting Provider Note  Report given to:   Additional Notes:

## 2019-02-04 NOTE — ED Notes (Signed)
Pharmacy called to confirm receipt of covid swab.

## 2019-02-04 NOTE — Progress Notes (Signed)
Pre HD Tx    02/04/19 1600  Hand-Off documentation  Report given to (Full Name) Trellis Paganini RN  Report received from (Full Name) Lucky Rathke RN  Vital Signs  Temp (!) 97.4 F (36.3 C)  Temp Source Axillary  Pulse Rate 83  Pulse Rate Source Monitor  Resp 16  BP 103/72  BP Location Right Arm  BP Method Automatic  Patient Position (if appropriate) Lying  Oxygen Therapy  SpO2 96 %  O2 Device Ventilator  Pain Assessment  Pain Scale CPOT  Pain Score 0  Critical Care Pain Observation Tool (CPOT)  Facial Expression 0  Body Movements 0  Muscle Tension 0  Compliance with ventilator (intubated pts.) 0  Vocalization (extubated pts.) N/A  CPOT Total 0  Dialysis Weight  Weight 49 kg  Type of Weight Post-Dialysis  Time-Out for Hemodialysis  What Procedure? Hemodialysis   Pt Identifiers(min of two) First/Last Name;MRN/Account#  Correct Site? Yes  Correct Side? Yes  Correct Procedure? Yes  Consents Verified? Yes  Rad Studies Available? N/A  Safety Precautions Reviewed? Yes  Engineer, civil (consulting) Number 3  Station Number  ([id number])  UF/Alarm Test Passed  Conductivity: Meter 14  Conductivity: Machine  13.8  pH 7.2  Reverse Osmosis WRO3  Normal Saline Lot Number [id number]  Dialyzer Lot Number [id number]  Disposable Set Lot Number [id number]  Machine Temperature 98.6 F (37 C)  Musician and Audible Yes  Blood Lines Intact and Secured Yes  Pre Treatment Patient Checks  Vascular access used during treatment Catheter  Hepatitis B Surface Antigen Results Negative  Date Hepatitis B Surface Antigen Drawn 01/24/19  Hepatitis B Surface Antibody  (<10)  Date Hepatitis B Surface Antibody Drawn 01/24/19  Hemodialysis Consent Verified Yes  Hemodialysis Standing Orders Initiated Yes  ECG (Telemetry) Monitor On Yes  Prime Ordered Normal Saline  Length of  DialysisTreatment -hour(s) 3.5 Hour(s)  Dialysis Treatment Comments Na 140  Dialyzer Elisio 17H NR  Dialysate 3K, 2.5  Ca (changed to 3k d/t potassium 3.8)  Dialysate Flow Ordered 800  Blood Flow Rate Ordered 400 mL/min  Ultrafiltration Goal 3 Liters  Pre Treatment Labs Phosphorus;Other (Comment) (PTH)  Dialysis Blood Pressure Support Ordered Normal Saline  Education / Care Plan  Dialysis Education Provided No (Comment) (unable to educate, intubated, sedated)  Fistula / Graft Left Upper arm Arteriovenous vein graft  Placement Date/Time: 12/26/18 1024   Placed prior to admission: No  Orientation: Left  Access Location: Upper arm  Access Type: (c) Arteriovenous vein graft  Site Condition No complications  Fistula / Graft Assessment Present;Thrill;Bruit  Status Other (Comment) (not currently using, per MD)  Drainage Description None  Hemodialysis Catheter Right Subclavian  No Placement Date or Time found.   Placed prior to admission: Yes  Orientation: Right  Access Location: Subclavian  Site Condition No complications  Blue Lumen Status Capped (Central line)  Red Lumen Status Capped (Central line)  Purple Lumen Status N/A  Dressing Type Gauze/Drain sponge  Dressing Status Clean;Dry;Intact;Dressing changed;Antimicrobial disc changed  Interventions New dressing;Dressing changed  Drainage Description None  Dressing Change Due 02/11/19

## 2019-02-04 NOTE — Progress Notes (Signed)
HD Tx End  Pt removed net UF 2169mL. Hypotensive multiple times, resulting in inability to reach UFG of 3.0kg.   Levo off d/t hypertension directly post tx.     02/04/19 1941  Vital Signs  Temp 98.4 F (36.9 C)  Temp Source Axillary  Pulse Rate 90  Pulse Rate Source Monitor  Resp (!) 24  BP (!) 181/100  BP Location Right Arm  BP Method Automatic  Patient Position (if appropriate) Lying  Oxygen Therapy  SpO2 100 %  O2 Device Ventilator  End Tidal CO2 (EtCO2) 33  Critical Care Pain Observation Tool (CPOT)  Facial Expression 0  Body Movements 1  Muscle Tension 1  Compliance with ventilator (intubated pts.) 0  Vocalization (extubated pts.) N/A  CPOT Total 2  Dialysis Weight  Weight 45.8 kg  Type of Weight Post-Dialysis  During Hemodialysis Assessment  Blood Flow Rate (mL/min) 200 mL/min  Arterial Pressure (mmHg) -100 mmHg  Venous Pressure (mmHg) 120 mmHg  Transmembrane Pressure (mmHg) 40 mmHg  Ultrafiltration Rate (mL/min) 0 mL/min  Dialysate Flow Rate (mL/min) 800 ml/min  Conductivity: Machine  14  HD Safety Checks Performed Yes  Dialysis Fluid Bolus Normal Saline  Bolus Amount (mL) 250 mL  Intra-Hemodialysis Comments Tx completed (BP elevated, levo d/c, rechecking BP)

## 2019-02-04 NOTE — ED Notes (Signed)
Et tube 7.5 Placement 22 at lip

## 2019-02-04 NOTE — Consult Note (Addendum)
Pharmacy Antibiotic Note  Brett Small is a 43 y.o. male admitted on 02/04/2019 with HAP.  Pharmacy has been consulted for Vancomycin and Cefepime dosing. Patient has already received a dose of Cefepime and Vancomycin in the ED. Patient receives dialysis. Given renal function, will likely need to dose based on levels instead of AUC calculations.  Will not need to order serum creatinine level as there is an order for the BMP to be drawn tomorrow morning.   No MRSA PCR ordered.   Plan: Will start Cefepime 1 g Q24H.   Obtain 24-hour Vanc Random level and MRSA-PCR.   Will follow Scr with AM labs.    Height: 5\' 1"  (154.9 cm) Weight: 108 lb 0.4 oz (49 kg) IBW/kg (Calculated) : 52.3  Temp (24hrs), Avg:98.1 F (36.7 C), Min:97.8 F (36.6 C), Max:98.3 F (36.8 C)  Recent Labs  Lab 02/04/19 1237  WBC 14.4*  CREATININE 9.19*  LATICACIDVEN 4.6*    Estimated Creatinine Clearance: 7 mL/min (A) (by C-G formula based on SCr of 9.19 mg/dL (H)).    Allergies  Allergen Reactions  . Codeine Nausea Only    Patient questioned this (??)  . Sulfa Antibiotics Hives and Nausea And Vomiting    Antimicrobials this admission: 4/15 Cefepime >>  4/15 Vancomycin >>   Dose adjustments this admission: N/A  Microbiology results: 4/15 BCx: pending  4/15 COVID: NEGATIVE 4/15 MRSA PCR: pending   Thank you for allowing pharmacy to be a part of this patient's care.  Rowland Lathe, PharmD 02/04/2019 3:11 PM

## 2019-02-04 NOTE — Consult Note (Addendum)
Name: Brett Small MRN: [id number] DOB: Jul 11, 1976    ADMISSION DATE:  02/04/2019 CONSULTATION DATE: 02/04/2019  REFERRING MD : Dr. Estanislado Pandy  CHIEF COMPLAINT: Shortness of Breath   BRIEF PATIENT DESCRIPTION:  43 yo male with ESRD admitted with acute hypoxic respiratory failure secondary to pulmonary edema and possible pneumonia requiring mechanical intubation due to noncompliance with hemodialysis   SIGNIFICANT EVENTS/STUDIES:  04/15-Pt admitted to the ICU mechanically intubated   HISTORY OF PRESENT ILLNESS:   This is a 43 yo male with a PMH of ESRD on HD and HTN.  He presented to [facility] ER via EMS on 04/15 with worsening shortness of breath.  Per EMS pt severely hypoxic with O2 sats in the 60's requiring CPAP.  Upon arrival to the ER pt initially placed on HFNC, however due to continued respiratory failure pt mechanically intubated.   CXR concerning for pulmonary edema and possible pneumonia.  Lab results revealed BUN 73, creatinine 9.19, troponin 0.07, lactic acid 4.6, wbc 14.4, hgb 10.8, COVID-19 negative, and abg pH 7.28/pCO2 49/pO2 93/acid-base 4.1.  Per ER notes the pt missed his hemodialysis session last session was 4 days ago.  In the ER he received iv labetalol due to HTN, lasix, cefepime, and solumedrol.  He was subsequently admitted to the ICU by hospitalist team for additional workup and treatment.   PAST MEDICAL HISTORY :   has a past medical history of Hypertension and Renal disorder.  has a past surgical history that includes Aorta - femoral artery bypass graft; AV fistula placement (Left, 12/26/2018); and A/V SHUNT INTERVENTION (Left, 01/19/2019). Prior to Admission medications   Medication Sig Start Date End Date Taking? Authorizing Provider  apixaban (ELIQUIS) 2.5 MG TABS tablet Take 1 tablet (2.5 mg total) by mouth 2 (two) times daily. 01/26/19  Yes Clapacs, Madie Reno, MD  cephALEXin (KEFLEX) 250 MG capsule Take 250 mg by mouth every 12 (twelve) hours. 01/31/19 02/06/19 Yes  [provider]  sevelamer carbonate (RENVELA) 800 MG tablet Take 2 tablets (1,600 mg total) by mouth 3 (three) times daily with meals. 01/26/19  Yes Clapacs, Madie Reno, MD  amLODipine (NORVASC) 5 MG tablet Take 1 tablet (5 mg total) by mouth daily at 8 pm. 01/26/19   Clapacs, Madie Reno, MD  aspirin EC 81 MG tablet Take 1 tablet (81 mg total) by mouth daily. 01/26/19   Clapacs, Madie Reno, MD  atorvastatin (LIPITOR) 80 MG tablet Take 1 tablet (80 mg total) by mouth at bedtime. 01/26/19   Clapacs, Madie Reno, MD  carvedilol (COREG) 25 MG tablet Take 1 tablet (25 mg total) by mouth 2 (two) times daily with a meal. 01/26/19   Clapacs, Madie Reno, MD  escitalopram (LEXAPRO) 10 MG tablet Take 1 tablet (10 mg total) by mouth daily. 01/26/19   Clapacs, Madie Reno, MD  gabapentin (NEURONTIN) 100 MG capsule Take 1 capsule (100 mg total) by mouth 3 (three) times daily. 01/26/19   Clapacs, Madie Reno, MD  hydrALAZINE (APRESOLINE) 25 MG tablet Take 1 tablet (25 mg total) by mouth every 8 (eight) hours. 01/26/19   Clapacs, Madie Reno, MD  hydrOXYzine (ATARAX/VISTARIL) 25 MG tablet Take 25 mg by mouth 3 (three) times daily as needed for anxiety.    [provider]  multivitamin (RENA-VIT) TABS tablet Take 1 tablet by mouth daily. 01/27/19   Clapacs, Madie Reno, MD  neomycin-bacitracin-polymyxin (NEOSPORIN) OINT Apply 1 application topically 2 (two) times daily. 01/26/19   Clapacs, Madie Reno, MD   Allergies  Allergen  Reactions   Codeine Nausea Only    Patient questioned this (??)   Sulfa Antibiotics Hives and Nausea And Vomiting    FAMILY HISTORY:  family history includes Clotting disorder in his father; Diabetes in an other family member; Hypertension in an other family member. SOCIAL HISTORY:  reports that he quit smoking about 7 months ago. He has never used smokeless tobacco. He reports previous alcohol use. He reports that he does not use drugs.  REVIEW OF SYSTEMS:   Unable to assess pt mechanically intubated   SUBJECTIVE:  Unable to  assess pt mechanically intubated   VITAL SIGNS: Temp:  [97.8 F (36.6 C)-98.3 F (36.8 C)] 98.3 F (36.8 C) (04/15 1356) Pulse Rate:  [94-160] 102 (04/15 1515) Resp:  [19-38] 25 (04/15 1515) BP: (157-215)/(99-127) 176/107 (04/15 1515) SpO2:  [79 %-100 %] 79 % (04/15 1515) FiO2 (%):  [80 %] 80 % (04/15 1252) Weight:  [49 kg] 49 kg (04/15 1214)  PHYSICAL EXAMINATION: General: acutely ill appearing male, NAD mechanically intubated  Neuro: sedated, not following commands, PERRL HEENT: supple, no JVD  Cardiovascular: nsr, rrr, no R/G  Lungs: crackles throughout, even, non labored Abdomen: +BS x4, soft, non distended  Musculoskeletal: normal bulk and tone, 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 Similar appearance of mixed interstitial and airspace opacities throughout the lungs with mild thickening of the minor fissure. No large pleural effusion. No pneumothorax. Right IJ hemodialysis catheter is unchanged. Endotracheal tube terminates 5.8 cm above the carina. Gastric tube terminates within the stomach. Stent graft of the left axillary vein/brachial vein IMPRESSION: Mixed interstitial and airspace opacities with the differential primarily including multifocal pneumonia (including atypical pathogens), and edema. Endotracheal tube terminates suitably above the carina. Right IJ hemodialysis catheter terminates at the superior vena cava. Gastric tube terminates in the stomach. Electronically Signed   By: Corrie Mckusick D.O.   On: 02/04/2019 13:44     ASSESSMENT / PLAN:  Acute hypoxic respiratory failure secondary to pulmonary edema secondary to noncompliance with hemodialysis session  Pneumonia  Mechanical intubation  Full vent support-vent settings reviewed and established  SBT once all parameters met  VAP bundle implemented  Repeat CXR in am  Nephrology consulted appreciate input-plans for emergent dialysis  Trend WBC and monitor fever curve Follow cultures  Continue vancomycin and cefepime for now if MRSA PCR negative will d/c vancomycin   HTN Continuous telemetry monitoring  Prn labetalol for bp management   ESRD on HD Trend BMP Replace electrolytes as indicated  Avoid nephrotoxic medications   Anemia without obvious acute blood loss VTE px: subq heparin Trend CBC Monitor for s/sx of bleeding and transfuse for hgb <7  Mechanical intubation discomfort/pain Maintain RASS goal 0 to -1 Propofol gtt and prn morphine to maintain RASS goal  WUA daily Urine drug screen pending   Will consult care management for assistance pt is homeless and lives under a bridge   [name], [place] Pager [phone] (please enter 7  digits) PCCM Consult Pager [phone] (please enter 7 digits)

## 2019-02-04 NOTE — Progress Notes (Signed)
Pre HD Assessment     02/04/19 1613  Neurological  Level of Consciousness Responds to Pain  Orientation Level Intubated/Tracheostomy - Unable to assess  Respiratory  Chest Assessment Chest expansion symmetrical  Bilateral Breath Sounds Diminished;Fine crackles  R Lower Breath Sounds Fine crackles  L Lower Breath Sounds Fine crackles  Cardiac  Pulse Regular  Heart Sounds S1, S2  Jugular Venous Distention (JVD) No  ECG Monitor Yes  Cardiac Rhythm NSR  Vascular  R Radial Pulse +2  L Radial Pulse +2  Edema  (trace edema, more than pts baseline)  Integumentary  Integumentary (WDL) X  Skin Color Pale  Skin Condition Dry  Musculoskeletal  Musculoskeletal (WDL) X  Generalized Weakness Yes  Gastrointestinal  Bowel Sounds Assessment Active  GU Assessment  Genitourinary (WDL) X  Genitourinary Symptoms Oliguria;Urinary Catheter  Psychosocial  Psychosocial (WDL) X  Patient Behaviors Not interactive  Emotional support given Given to patient

## 2019-02-04 NOTE — ED Provider Notes (Signed)
[facility] Emergency Department Provider Note  ____________________________________________  Time seen: Approximately 2:17 PM  I have reviewed the triage vital signs and the nursing notes.   HISTORY  Chief Complaint No chief complaint on file.  Level 5 caveat:  Portions of the history and physical were unable to be obtained due to severe respiratory distress   HPI Devlyn Retter is a 43 y.o. male with a history of ESRD on HD, hypertension who presents for evaluation of shortness of breath.  Patient reports that he missed dialysis yesterday.  Last treatment was 4 days ago.  Has had 2 days of progressively worsening shortness of breath.  Has had cough productive of yellow and reddish sputum.  No fever or chills.  No nausea, vomiting or diarrhea.  He does endorse chest tightness which started this morning.  His shortness of breath today became severe.  Patient was satting 60% per EMS on arrival.  He was transported on a CPAP and transition to a nonrebreather prior to entering his room in the emergency department.  He denies any known exposure to Covid 19 or travel to endemic areas.  Past Medical History:  Diagnosis Date   Hypertension    Renal disorder     Patient Active Problem List   Diagnosis Date Noted   Acute respiratory failure ([place]) 02/04/2019   Acute respiratory failure with hypoxemia ([place]) 01/27/2019   Depression 01/07/2019   MDD (major depressive disorder), single episode, severe , no psychosis ([place])    Homelessness    Acute respiratory failure with hypoxia ([place]) 12/25/2018   End stage renal disease ([place]) 12/25/2018   Hypertension 12/25/2018   Renal osteodystrophy 12/25/2018    Past Surgical History:  Procedure Laterality Date   A/V SHUNT INTERVENTION Left 01/19/2019   Procedure: LEFT UPPER EXTREMITY A/V SHUNTOGRAM / UPPER EXTREMITY ANGIOGRAM;  Surgeon: Algernon Huxley, MD;  Location: [place] [facility];  Service: Cardiovascular;   Laterality: Left;   AORTA - FEMORAL ARTERY BYPASS GRAFT     AV FISTULA PLACEMENT Left 12/26/2018   Procedure: INSERTION OF GORE STRETCH VASCULAR 4-7MM X  45CM IN LEFT UPPER ARM;  Surgeon: Marty Heck, MD;  Location: [place];  Service: Vascular;  Laterality: Left;    Prior to Admission medications   Medication Sig Start Date End Date Taking? Authorizing Provider  apixaban (ELIQUIS) 2.5 MG TABS tablet Take 1 tablet (2.5 mg total) by mouth 2 (two) times daily. 01/26/19  Yes Clapacs, Madie Reno, MD  cephALEXin (KEFLEX) 250 MG capsule Take 250 mg by mouth every 12 (twelve) hours. 01/31/19 02/06/19 Yes [provider]  sevelamer carbonate (RENVELA) 800 MG tablet Take 2 tablets (1,600 mg total) by mouth 3 (three) times daily with meals. 01/26/19  Yes Clapacs, Madie Reno, MD  amLODipine (NORVASC) 5 MG tablet Take 1 tablet (5 mg total) by mouth daily at 8 pm. 01/26/19   Clapacs, Madie Reno, MD  aspirin EC 81 MG tablet Take 1 tablet (81 mg total) by mouth daily. 01/26/19   Clapacs, Madie Reno, MD  atorvastatin (LIPITOR) 80 MG tablet Take 1 tablet (80 mg total) by mouth at bedtime. 01/26/19   Clapacs, Madie Reno, MD  carvedilol (COREG) 25 MG tablet Take 1 tablet (25 mg total) by mouth 2 (two) times daily with a meal. 01/26/19   Clapacs, Madie Reno, MD  escitalopram (LEXAPRO) 10 MG tablet Take 1 tablet (10 mg total) by mouth daily. 01/26/19   Clapacs, Madie Reno, MD  gabapentin (  NEURONTIN) 100 MG capsule Take 1 capsule (100 mg total) by mouth 3 (three) times daily. 01/26/19   Clapacs, Madie Reno, MD  hydrALAZINE (APRESOLINE) 25 MG tablet Take 1 tablet (25 mg total) by mouth every 8 (eight) hours. 01/26/19   Clapacs, Madie Reno, MD  hydrOXYzine (ATARAX/VISTARIL) 25 MG tablet Take 25 mg by mouth 3 (three) times daily as needed for anxiety.    [provider]  multivitamin (RENA-VIT) TABS tablet Take 1 tablet by mouth daily. 01/27/19   Clapacs, Madie Reno, MD  neomycin-bacitracin-polymyxin (NEOSPORIN) OINT Apply 1 application topically 2 (two)  times daily. 01/26/19   Clapacs, Madie Reno, MD    Allergies Codeine and Sulfa antibiotics  Family History  Problem Relation Age of Onset   Hypertension Other    Diabetes Other    Clotting disorder Father     Social History Social History   Tobacco Use   Smoking status: Former Smoker    Last attempt to quit: 06/26/2018    Years since quitting: 0.6   Smokeless tobacco: Never Used   Tobacco comment: smoked for 30 years   Substance Use Topics   Alcohol use: Not Currently   Drug use: Never    Review of Systems  Constitutional: Negative for fever. Cardiovascular: + chest pain. Respiratory: + shortness of breath, cough Gastrointestinal: Negative for abdominal pain, vomiting or diarrhea. Genitourinary: Negative for dysuria. Musculoskeletal: Negative for back pain. Skin: Negative for rash. Neurological: Negative for headaches, weakness or numbness. Psych: No SI or HI  ____________________________________________   PHYSICAL EXAM:  VITAL SIGNS: ED Triage Vitals  Enc Vitals Group     BP 02/04/19 1230 (!) 164/123     Pulse Rate 02/04/19 1214 (!) 146     Resp 02/04/19 1214 (!) 38     Temp 02/04/19 1214 97.8 F (36.6 C)     Temp Source 02/04/19 1214 Axillary     SpO2 02/04/19 1214 100 %     Weight 02/04/19 1214 108 lb 0.4 oz (49 kg)     Height 02/04/19 1214 5\' 1"  (1.549 m)     Head Circumference --      Peak Flow --      Pain Score 02/04/19 1214 0     Pain Loc --      Pain Edu? --      Excl. in [place]? --     Constitutional: Patient arrives in severe respiratory distress HEENT:      Head: Normocephalic and atraumatic.         Eyes: Conjunctivae are normal. Sclera is non-icteric.       Mouth/Throat: Mucous membranes are moist.       Neck: Supple with no signs of meningismus. Cardiovascular: Tachycardic with regular rhythm. No murmurs, gallops, or rubs. 2+ symmetrical distal pulses are present in all extremities. No JVD. Respiratory: Tripoding, severe respiratory  distress using accessory muscles of respiration, diffuse coarse rhonchi bilaterally Gastrointestinal: Soft, non tender, and non distended with positive bowel sounds. No rebound or guarding. Musculoskeletal:  No edema, cyanosis, or erythema of extremities. Neurologic: Normal speech and language. Face is symmetric. Moving all extremities. No gross focal neurologic deficits are appreciated. Skin: Skin is warm, dry and intact. No rash noted. Psychiatric: Mood and affect are normal. Speech and behavior are normal.  ____________________________________________   LABS (all labs ordered are listed, but only abnormal results are displayed)  Labs Reviewed  CBC WITH DIFFERENTIAL/PLATELET - Abnormal; Notable for the following components:  Result Value   WBC 14.4 (*)    RBC 3.46 (*)    Hemoglobin 10.8 (*)    HCT 34.0 (*)    RDW 18.3 (*)    Neutro Abs 11.8 (*)    All other components within normal limits  LACTIC ACID, PLASMA - Abnormal; Notable for the following components:   Lactic Acid, Venous 4.6 (*)    All other components within normal limits  COMPREHENSIVE METABOLIC PANEL - Abnormal; Notable for the following components:   CO2 18 (*)    Glucose, Bld 158 (*)    BUN 73 (*)    Creatinine, Ser 9.19 (*)    Calcium 8.3 (*)    Albumin 3.4 (*)    Total Bilirubin 1.3 (*)    GFR calc non Af Amer 6 (*)    GFR calc Af Amer 7 (*)    Anion gap 18 (*)    All other components within normal limits  TROPONIN I - Abnormal; Notable for the following components:   Troponin I 0.07 (*)    All other components within normal limits  BLOOD GAS, ARTERIAL - Abnormal; Notable for the following components:   pH, Arterial 7.28 (*)    pCO2 arterial 49 (*)    Acid-base deficit 4.1 (*)    All other components within normal limits  CBC - Abnormal; Notable for the following components:   WBC 10.7 (*)    RBC 3.06 (*)    Hemoglobin 9.4 (*)    HCT 30.2 (*)    RDW 18.1 (*)    All other components within  normal limits  SARS CORONAVIRUS 2 (HOSPITAL ORDER, [facility])  CULTURE, BLOOD (ROUTINE X 2)  CULTURE, BLOOD (ROUTINE X 2)  MRSA PCR SCREENING  CREATININE, SERUM  LACTIC ACID, PLASMA  PHOSPHORUS  PARATHYROID HORMONE, INTACT (NO CA)  ABO/RH   ____________________________________________  EKG  ED ECG REPORT I, Rudene Re, the attending physician, personally viewed and interpreted this ECG.  Sinus tachycardia, rate of 144, borderline prolonged QTC, normal axis, diffuse ST depressions with no ST elevation. ____________________________________________  RADIOLOGY  I have personally reviewed the images performed during this visit and I agree with the Radiologist's read.   Interpretation by Radiologist:  Dg Chest Portable 1 View  Result Date: 02/04/2019 CLINICAL DATA:  43 year old male with shortness of breath EXAM: PORTABLE CHEST 1 VIEW COMPARISON:  01/27/2019, 12/24/2010 FINDINGS: Cardiomediastinal silhouette unchanged. Similar appearance of mixed interstitial and airspace opacities throughout the lungs with mild thickening of the minor fissure. No large pleural effusion. No pneumothorax. Right IJ hemodialysis catheter is unchanged. Endotracheal tube terminates 5.8 cm above the carina. Gastric tube terminates within the stomach. Stent graft of the left axillary vein/brachial vein IMPRESSION: Mixed interstitial and airspace opacities with the differential primarily including multifocal pneumonia (including atypical pathogens), and edema. Endotracheal tube terminates suitably above the carina. Right IJ hemodialysis catheter terminates at the superior vena cava. Gastric tube terminates in the stomach. Electronically Signed   By: Corrie Mckusick D.O.   On: 02/04/2019 13:44     ____________________________________________   PROCEDURES  Procedure(s) performed:yes Procedure Name: Intubation Date/Time: 02/04/2019 2:21 PM Performed by: Rudene Re,  MD Pre-anesthesia Checklist: Patient identified, Emergency Drugs available, Suction available and Patient being monitored Oxygen Delivery Method: Non-rebreather mask Preoxygenation: Pre-oxygenation with 100% oxygen Induction Type: IV induction and Rapid sequence Laryngoscope Size: Glidescope Tube size: 7.5 mm Number of attempts: 1 Airway Equipment and Method: Video-laryngoscopy Placement Confirmation: ETT inserted  through vocal cords under direct vision,  CO2 detector and Breath sounds checked- equal and bilateral Secured at: 22 cm Tube secured with: ETT holder Dental Injury: Teeth and Oropharynx as per pre-operative assessment     OG placement Date/Time: 02/04/2019 2:24 PM Performed by: Rudene Re, MD Authorized by: Rudene Re, MD  Consent: The procedure was performed in an emergent situation.  Sedation: Patient sedated: Intubated.  Patient tolerance: Patient tolerated the procedure well with no immediate complications        Critical Care performed: yes  CRITICAL CARE Performed by: Rudene Re  ?  Total critical care time: 45 min  Critical care time was exclusive of separately billable procedures and treating other patients.  Critical care was necessary to treat or prevent imminent or life-threatening deterioration.  Critical care was time spent personally by me on the following activities: development of treatment plan with patient and/or surrogate as well as nursing, discussions with consultants, evaluation of patient's response to treatment, examination of patient, obtaining history from patient or surrogate, ordering and performing treatments and interventions, ordering and review of laboratory studies, ordering and review of radiographic studies, pulse oximetry and re-evaluation of patient's condition.  ____________________________________________   INITIAL IMPRESSION / ASSESSMENT AND PLAN / ED COURSE  43 y.o. male with a history of  ESRD on HD, hypertension who presents for evaluation of shortness of breath.  Patient arrives in severe respiratory distress after missing dialysis.  Last dialysis treatment was 4 days ago.  Patient also has a productive cough but is afebrile both at home and here.  No known exposure to Covid 19.  Initially patient was placed on a nonrebreather and transition to high flow nasal cannula.  Patient continued to have severe respiratory distress, increased work of breathing and due to concerns of patient tiring out a decision was made to intubate.  Patient was in agreement.  Patient was intubated with etomidate and rocuronium. Precedex was initiated for sedation.  Chest x-ray concerning for multifocal pneumonia versus pulmonary edema.  Patient was covered with IV antibiotics.  A Foley catheter and IV Lasix were given.  Labs showing anion gap metabolic acidosis but normal potassium.  EKG with no evidence of dysrhythmias.  Lactic acid elevated.  IV fluids were held due to missed dialysis and concerns for worsening pulmonary edema.  Discussed with Dr. Holley Raring from nephrology for emergent dialysis.  Dr. Holley Raring recommended a dose of IV labetalol to help controlled severely elevated BP which could also be contributing to patient's flash pulmonary edema. Discussed with Dr. Estanislado Pandy for ICU admission. Covid negative.       As part of my medical decision making, I reviewed the following data within the [facility] notes reviewed and incorporated, Labs reviewed , EKG interpreted , Old EKG reviewed, Radiograph reviewed , Discussed with admitting physician , A consult was requested and obtained from this/these consultant(s) Nephrology, Notes from prior ED visits and [place] Controlled Substance Database    Pertinent labs & imaging results that were available during my care of the patient were reviewed by me and considered in my medical decision making (see chart for  details).    ____________________________________________   FINAL CLINICAL IMPRESSION(S) / ED DIAGNOSES  Final diagnoses:  Acute respiratory failure with hypoxia ([place])  Acute pulmonary edema (H[facility])  Demand ischemia (H[facility])      NEW MEDICATIONS STARTED DURING THIS VISIT:  ED Discharge Orders    None       Note:  This  document was prepared using Systems analyst and may include unintentional dictation errors.    Alfred Levins, [place], MD 02/04/19 [phone]

## 2019-02-04 NOTE — ED Notes (Addendum)
Patient sedated with 10 mg etomidate IV and 70 mg roconiumI IVP.  Patient intubated with 7.5 tube 23at the lip. Positive color change noted. Xray to confirm. OG placed to wall suction.Marland Kitchen

## 2019-02-04 NOTE — Progress Notes (Signed)
HD Tx End   Pt removed net UF 2166mL. Hypotensive multiple times, resulting in inability to reach UFG of 3.0kg.    Levo off d/t hypertension directly post tx.     02/04/19 2001  Neurological  Level of Consciousness Responds to Pain  Orientation Level Intubated/Tracheostomy - Unable to assess  Respiratory  Bilateral Breath Sounds Diminished;Fine crackles  Cardiac  Pulse Regular  Heart Sounds S1, S2  Jugular Venous Distention (JVD) No  ECG Monitor Yes  Cardiac Rhythm NSR  Vascular  R Radial Pulse +2  L Radial Pulse +2  Edema  (trace edema, more than pts baseline)  Integumentary  Integumentary (WDL) X  Skin Color Pale  Skin Condition Dry  Musculoskeletal  Musculoskeletal (WDL) X  Generalized Weakness Yes  Gastrointestinal  Bowel Sounds Assessment Active  GU Assessment  Genitourinary (WDL) X  Genitourinary Symptoms Oliguria;Urinary Catheter  Psychosocial  Psychosocial (WDL) X  Patient Behaviors Not interactive  Emotional support given Given to patient

## 2019-02-04 NOTE — Progress Notes (Signed)
Post HD Tx  2164 net removal.    02/04/19 1942  Vital Signs  Pulse Rate 84  Resp (!) 23  BP (!) 141/84 (recheck)  Oxygen Therapy  SpO2 100 %  End Tidal CO2 (EtCO2) 33  Post-Hemodialysis Assessment  Rinseback Volume (mL) 250 mL  Dialyzer Clearance Lightly streaked  Duration of HD Treatment -hour(s) 3.5 hour(s)  Hemodialysis Intake (mL) 500 mL  UF Total -Machine (mL) 2646 mL  Net UF (mL) 2146 mL  Hemodialysis Catheter Right Subclavian  No Placement Date or Time found.   Placed prior to admission: Yes  Orientation: Right  Access Location: Subclavian  Site Condition No complications  Blue Lumen Status Flushed;Saline locked;Capped (Central line);Heparin locked  Red Lumen Status Flushed;Saline locked;Capped (Central line);Heparin locked  Purple Lumen Status N/A  Catheter fill solution Heparin 1000 units/ml  Catheter fill volume (Arterial) 1.7 cc  Catheter fill volume (Venous) 1.7  Dressing Type Biopatch  Dressing Status Clean;Dry;Intact;Dressing changed;Antimicrobial disc changed  Interventions New dressing  Drainage Description None  Dressing Change Due 02/11/19  Post treatment catheter status Capped and Clamped

## 2019-02-04 NOTE — ED Notes (Signed)
12:35 20of etomdiate 12:36 70 of roc

## 2019-02-04 NOTE — ED Notes (Signed)
Patient working to breathe, grey in color sating 100 on 100% high flow but continue to work to [employer]. RT at bedside to  Assist with intubation.

## 2019-02-04 NOTE — ED Notes (Signed)
RT at bdside to collect Blood Gas

## 2019-02-05 ENCOUNTER — Inpatient Hospital Stay: Payer: Medicare Other

## 2019-02-05 DIAGNOSIS — N186 End stage renal disease: Secondary | ICD-10-CM

## 2019-02-05 DIAGNOSIS — I120 Hypertensive chronic kidney disease with stage 5 chronic kidney disease or end stage renal disease: Secondary | ICD-10-CM

## 2019-02-05 DIAGNOSIS — Z881 Allergy status to other antibiotic agents status: Secondary | ICD-10-CM

## 2019-02-05 DIAGNOSIS — R7881 Bacteremia: Secondary | ICD-10-CM

## 2019-02-05 DIAGNOSIS — J9601 Acute respiratory failure with hypoxia: Secondary | ICD-10-CM

## 2019-02-05 DIAGNOSIS — I739 Peripheral vascular disease, unspecified: Secondary | ICD-10-CM

## 2019-02-05 DIAGNOSIS — B957 Other staphylococcus as the cause of diseases classified elsewhere: Secondary | ICD-10-CM

## 2019-02-05 DIAGNOSIS — Z885 Allergy status to narcotic agent status: Secondary | ICD-10-CM

## 2019-02-05 DIAGNOSIS — Z95820 Peripheral vascular angioplasty status with implants and grafts: Secondary | ICD-10-CM

## 2019-02-05 DIAGNOSIS — Z992 Dependence on renal dialysis: Secondary | ICD-10-CM

## 2019-02-05 DIAGNOSIS — Z87891 Personal history of nicotine dependence: Secondary | ICD-10-CM

## 2019-02-05 DIAGNOSIS — Z89422 Acquired absence of other left toe(s): Secondary | ICD-10-CM

## 2019-02-05 DIAGNOSIS — J811 Chronic pulmonary edema: Secondary | ICD-10-CM

## 2019-02-05 DIAGNOSIS — J810 Acute pulmonary edema: Secondary | ICD-10-CM

## 2019-02-05 DIAGNOSIS — R011 Cardiac murmur, unspecified: Secondary | ICD-10-CM

## 2019-02-05 DIAGNOSIS — Z9911 Dependence on respirator [ventilator] status: Secondary | ICD-10-CM

## 2019-02-05 DIAGNOSIS — Z9115 Patient's noncompliance with renal dialysis: Secondary | ICD-10-CM

## 2019-02-05 LAB — BLOOD GAS, ARTERIAL
Acid-Base Excess: 3.6 mmol/L — ABNORMAL HIGH (ref 0.0–2.0)
Bicarbonate: 26.3 mmol/L (ref 20.0–28.0)
FIO2: 0.6
MECHVT: 500 mL
O2 Saturation: 96.2 %
PEEP: 5 cmH2O
Patient temperature: 37
RATE: 16 resp/min
pCO2 arterial: 33 mmHg (ref 32.0–48.0)
pH, Arterial: 7.51 — ABNORMAL HIGH (ref 7.350–7.450)
pO2, Arterial: 75 mmHg — ABNORMAL LOW (ref 83.0–108.0)

## 2019-02-05 LAB — BLOOD CULTURE ID PANEL (REFLEXED)

## 2019-02-05 LAB — BASIC METABOLIC PANEL
Anion gap: 20 — ABNORMAL HIGH (ref 5–15)
BUN: 44 mg/dL — ABNORMAL HIGH (ref 6–20)
CO2: 23 mmol/L (ref 22–32)
Calcium: 8.5 mg/dL — ABNORMAL LOW (ref 8.9–10.3)
Chloride: 97 mmol/L — ABNORMAL LOW (ref 98–111)
Creatinine, Ser: 5.84 mg/dL — ABNORMAL HIGH (ref 0.61–1.24)
GFR calc Af Amer: 12 mL/min — ABNORMAL LOW (ref >60)
GFR calc non Af Amer: 11 mL/min — ABNORMAL LOW (ref >60)
Glucose, Bld: 123 mg/dL — ABNORMAL HIGH (ref 70–99)
Potassium: 4.1 mmol/L (ref 3.5–5.1)
Sodium: 140 mmol/L (ref 135–145)

## 2019-02-05 LAB — CBC
HCT: 29.1 % — ABNORMAL LOW (ref 39.0–52.0)
Hemoglobin: 9.2 g/dL — ABNORMAL LOW (ref 13.0–17.0)
MCH: 30.9 pg (ref 26.0–34.0)
MCHC: 31.6 g/dL (ref 30.0–36.0)
MCV: 97.7 fL (ref 80.0–100.0)
Platelets: 140 10*3/uL — ABNORMAL LOW (ref 150–400)
RBC: 2.98 MIL/uL — ABNORMAL LOW (ref 4.22–5.81)
RDW: 17.5 % — ABNORMAL HIGH (ref 11.5–15.5)
WBC: 6.2 10*3/uL (ref 4.0–10.5)
nRBC: 0 % (ref 0.0–0.2)

## 2019-02-05 LAB — TRIGLYCERIDES: Triglycerides: 188 mg/dL — ABNORMAL HIGH (ref <150)

## 2019-02-05 LAB — ABO/RH: ABO/RH(D): AB POS

## 2019-02-05 LAB — PARATHYROID HORMONE, INTACT (NO CA): PTH: 336 pg/mL — ABNORMAL HIGH (ref 15–65)

## 2019-02-05 MED ORDER — FENTANYL CITRATE (PF) 100 MCG/2ML IJ SOLN
25.0000 ug | INTRAMUSCULAR | Status: DC | PRN
  Filled 2019-02-05: qty 2

## 2019-02-05 MED ORDER — APIXABAN 2.5 MG PO TABS
2.5000 mg | ORAL_TABLET | Freq: Two times a day (BID) | ORAL | Status: DC
  Administered 2019-02-05 – 2019-02-09 (×8): 2.5 mg via ORAL
  Filled 2019-02-05 (×8): qty 1

## 2019-02-05 MED ORDER — PANTOPRAZOLE SODIUM 40 MG IV SOLR
40.0000 mg | INTRAVENOUS | Status: DC
  Administered 2019-02-05 – 2019-02-09 (×5): 40 mg via INTRAVENOUS
  Filled 2019-02-05 (×5): qty 40

## 2019-02-05 MED ORDER — ACETAMINOPHEN 650 MG RE SUPP: 650.0000 mg | Freq: Four times a day (QID) | RECTAL | Status: DC | PRN

## 2019-02-05 MED ORDER — ACETAMINOPHEN 325 MG PO TABS: 650.0000 mg | ORAL_TABLET | Freq: Four times a day (QID) | ORAL | Status: DC | PRN

## 2019-02-05 NOTE — Progress Notes (Signed)
PHARMACY - PHYSICIAN COMMUNICATION CRITICAL VALUE ALERT - BLOOD CULTURE IDENTIFICATION (BCID)  Brett Small is an 43 y.o. male who presented to [facility] on 02/04/2019 with a chief complaint of SOB/likely pneumonia  Assessment:  1/4 Anaerobic bottle onlyCoNS - mecA +  (include suspected source if known)  Name of physician (or Provider) Contacted: Simonds  Current antibiotics: Vancomycin + Cefepime  Changes to prescribed antibiotics recommended:  Continue current regimen  Results for orders placed or performed during the hospital encounter of 02/04/19  Blood Culture ID Panel (Reflexed) (Collected: 02/04/2019 12:19 PM)  Result Value Ref Range   Enterococcus species NOT DETECTED NOT DETECTED   Listeria monocytogenes NOT DETECTED NOT DETECTED   Staphylococcus species DETECTED (A) NOT DETECTED   Staphylococcus aureus (BCID) NOT DETECTED NOT DETECTED   Methicillin resistance DETECTED (A) NOT DETECTED   Streptococcus species NOT DETECTED NOT DETECTED   Streptococcus agalactiae NOT DETECTED NOT DETECTED   Streptococcus pneumoniae NOT DETECTED NOT DETECTED   Streptococcus pyogenes NOT DETECTED NOT DETECTED   Acinetobacter baumannii NOT DETECTED NOT DETECTED   Enterobacteriaceae species NOT DETECTED NOT DETECTED   Enterobacter cloacae complex NOT DETECTED NOT DETECTED   Escherichia coli NOT DETECTED NOT DETECTED   Klebsiella oxytoca NOT DETECTED NOT DETECTED   Klebsiella pneumoniae NOT DETECTED NOT DETECTED   Proteus species NOT DETECTED NOT DETECTED   Serratia marcescens NOT DETECTED NOT DETECTED   Haemophilus influenzae NOT DETECTED NOT DETECTED   Neisseria meningitidis NOT DETECTED NOT DETECTED   Pseudomonas aeruginosa NOT DETECTED NOT DETECTED   Candida albicans NOT DETECTED NOT DETECTED   Candida glabrata NOT DETECTED NOT DETECTED   Candida krusei NOT DETECTED NOT DETECTED   Candida parapsilosis NOT DETECTED NOT DETECTED   Candida tropicalis NOT DETECTED NOT [place], PharmD Pharmacy Resident  02/05/2019 8:25 AM

## 2019-02-05 NOTE — Progress Notes (Signed)
[facility]  ROUNDING NOTE   Subjective:  Patient seen at bedside. Known to [place] from outpatient hemodialysis. Patient came late for hemodialysis on Tuesday and did not have treatment. Then became significantly short of breath yesterday. Blood pressure was quite high initially. Patient underwent urgent dialysis and tolerated well.   Objective:  Vital signs in last 24 hours:  Temp:  [97.4 F (36.3 C)-98.4 F (36.9 C)] 98.1 F (36.7 C) (04/16 0400) Pulse Rate:  [56-102] 57 (04/16 1300) Resp:  [14-25] 14 (04/16 1300) BP: (61-181)/(43-114) 115/69 (04/16 1300) SpO2:  [79 %-100 %] 100 % (04/16 1300) FiO2 (%):  [35 %-80 %] 35 % (04/16 1130) Weight:  [45.8 kg-49 kg] 45.8 kg (04/15 1941)  Weight change:  Filed Weights   02/04/19 1600 02/04/19 1630 02/04/19 1941  Weight: 49 kg 47.8 kg 45.8 kg    Intake/Output: I/O last 3 completed shifts: In: 575.4 [I.V.:375.4; IV Piggyback:200] Out: 2218 [Urine:22; Emesis/NG output:50; Other:2146]   Intake/Output this shift:  Total I/O In: 66.2 [I.V.:66.2] Out: -   Physical Exam: General: Laying in bed, criticlaly ill appeairng  Head: Normocephalic, atraumatic. ETT in place  Eyes: Anicteric  Neck: Supple  Lungs:  Scattered rhonchi bilateral, normal effort, vent assisted  Heart: Regular, no rubs  Abdomen:  Soft, nontender, bowel sounds present  Extremities: no peripheral edema.  Neurologic: Awake, alert, able to nod yes/no  Skin: No lesions      Access: IJ PermCath, left arm AVG +thrill and +bruit    Basic Metabolic Panel: Recent Labs  Lab 02/04/19 1237 02/04/19 1526 02/04/19 1619 02/05/19 0251  NA 142  --   --  140  K 3.8  --   --  4.1  CL 106  --   --  97*  CO2 18*  --   --  23  GLUCOSE 158*  --   --  123*  BUN 73*  --   --  44*  CREATININE 9.19* 9.41*  --  5.84*  CALCIUM 8.3*  --   --  8.5*  PHOS  --   --  6.7*  --     Liver Function Tests: Recent Labs  Lab 02/04/19 1237  AST 31  ALT 12  ALKPHOS 82   BILITOT 1.3*  PROT 7.5  ALBUMIN 3.4*   No results for input(s): LIPASE, AMYLASE in the last 168 hours. No results for input(s): AMMONIA in the last 168 hours.  CBC: Recent Labs  Lab 02/04/19 1237 02/04/19 1526 02/05/19 0251  WBC 14.4* 10.7* 6.2  NEUTROABS 11.8*  --   --   HGB 10.8* 9.4* 9.2*  HCT 34.0* 30.2* 29.1*  MCV 98.3 98.7 97.7  PLT 285 182 140*    Cardiac Enzymes: Recent Labs  Lab 02/04/19 1237  TROPONINI 0.07*    BNP: Invalid input(s): POCBNP  CBG: Recent Labs  Lab 02/04/19 1606  GLUCAP 183*    Microbiology: Results for orders placed or performed during the hospital encounter of 02/04/19  Blood culture (routine x 2)     Status: None (Preliminary result)   Collection Time: 02/04/19 12:19 PM  Result Value Ref Range Status   Specimen Description   Final    BLOOD RIGHT ANTECUBITAL Performed at [facility], [address], [place], [place] [address]    Special Requests   Final    BOTTLES DRAWN AEROBIC AND ANAEROBIC Blood Culture adequate volume Performed at [facility], [address] [place], [place] [address]  Culture  Setup Time   Final    GRAM POSITIVE COCCI ANAEROBIC BOTTLE ONLY CRITICAL RESULT CALLED TO, READ BACK BY AND VERIFIED WITH: [facility] DUNCAN AT 6503 02/05/2019 [place] Performed at [facility], [place] [address] [place], [place] [address]    Culture GRAM POSITIVE COCCI  Final   Report Status PENDING  Incomplete  Blood Culture ID Panel (Reflexed)     Status: Abnormal   Collection Time: 02/04/19 12:19 PM  Result Value Ref Range Status   Enterococcus species NOT DETECTED NOT DETECTED Final   Listeria monocytogenes NOT DETECTED NOT DETECTED Final   Staphylococcus species DETECTED (A) NOT DETECTED Final    Comment: Methicillin (oxacillin) resistant coagulase negative staphylococcus. Possible blood culture contaminant (unless isolated from more than one blood culture draw or clinical case suggests pathogenicity).  No antibiotic treatment is indicated for blood  culture contaminants. CRITICAL RESULT CALLED TO, READ BACK BY AND VERIFIED WITH:  [facility] DUNCAN AT 8127 02/05/2019 SDR    Staphylococcus aureus (BCID) NOT DETECTED NOT DETECTED Final   Methicillin resistance DETECTED (A) NOT DETECTED Final    Comment: CRITICAL RESULT CALLED TO, READ BACK BY AND VERIFIED WITH:  ASAJAH DUNCAN AT 0715 02/05/2019 SDR    Streptococcus species NOT DETECTED NOT DETECTED Final   Streptococcus agalactiae NOT DETECTED NOT DETECTED Final   Streptococcus pneumoniae NOT DETECTED NOT DETECTED Final   Streptococcus pyogenes NOT DETECTED NOT DETECTED Final   Acinetobacter baumannii NOT DETECTED NOT DETECTED Final   Enterobacteriaceae species NOT DETECTED NOT DETECTED Final   Enterobacter cloacae complex NOT DETECTED NOT DETECTED Final   Escherichia coli NOT DETECTED NOT DETECTED Final   Klebsiella oxytoca NOT DETECTED NOT DETECTED Final   Klebsiella pneumoniae NOT DETECTED NOT DETECTED Final   Proteus species NOT DETECTED NOT DETECTED Final   Serratia marcescens NOT DETECTED NOT DETECTED Final   Haemophilus influenzae NOT DETECTED NOT DETECTED Final   Neisseria meningitidis NOT DETECTED NOT DETECTED Final   Pseudomonas aeruginosa NOT DETECTED NOT DETECTED Final   Candida albicans NOT DETECTED NOT DETECTED Final   Candida glabrata NOT DETECTED NOT DETECTED Final   Candida krusei NOT DETECTED NOT DETECTED Final   Candida parapsilosis NOT DETECTED NOT DETECTED Final   Candida tropicalis NOT DETECTED NOT DETECTED Final    Comment: Performed at [facility], [address] [place],  [address]  SARS Coronavirus 2 [facility] order, Performed in [place] hospital lab)     Status: None   Collection Time: 02/04/19 12:20 PM  Result Value Ref Range Status   SARS Coronavirus 2 NEGATIVE NEGATIVE Final    Comment: (NOTE) If result is NEGATIVE SARS-CoV-2 target nucleic acids are NOT DETECTED. The SARS-CoV-2 RNA  is generally detectable in upper and lower  respiratory specimens during the acute phase of infection. The lowest  concentration of SARS-CoV-2 viral copies this assay can detect is 250  copies / mL. A negative result does not preclude SARS-CoV-2 infection  and should not be used as the sole basis for treatment or other  patient management decisions.  A negative result may occur with  improper specimen collection / handling, submission of specimen other  than nasopharyngeal swab, presence of viral mutation(s) within the  areas targeted by this assay, and inadequate number of viral copies  (<250 copies / mL). A negative result must be combined with clinical  observations, patient history, and epidemiological information. If result is POSITIVE SARS-CoV-2 target nucleic acids are DETECTED. The SARS-CoV-2 RNA is  generally detectable in upper and lower  respiratory specimens dur ing the acute phase of infection.  Positive  results are indicative of active infection with SARS-CoV-2.  Clinical  correlation with patient history and other diagnostic information is  necessary to determine patient infection status.  Positive results do  not rule out bacterial infection or co-infection with other viruses. If result is PRESUMPTIVE POSTIVE SARS-CoV-2 nucleic acids MAY BE PRESENT.   A presumptive positive result was obtained on the submitted specimen  and confirmed on repeat testing.  While 2019 novel coronavirus  (SARS-CoV-2) nucleic acids may be present in the submitted sample  additional confirmatory testing may be necessary for epidemiological  and / or clinical management purposes  to differentiate between  SARS-CoV-2 and other Sarbecovirus currently known to infect humans.  If clinically indicated additional testing with an alternate test  methodology [phone]) is advised. The SARS-CoV-2 RNA is generally  detectable in upper and lower respiratory sp ecimens during the acute  phase of  infection. The expected result is Negative. Fact Sheet for Patients:  StrictlyIdeas.no Fact Sheet for Healthcare Providers: BankingDealers.co.za This test is not yet approved or cleared by the [place] FDA and has been authorized for detection and/or diagnosis of SARS-CoV-2 by FDA under an Emergency Use Authorization (EUA).  This EUA will remain in effect (meaning this test can be used) for the duration of the COVID-19 declaration under Section 564(b)(1) of the Act, 21 U.S.C. section 360bbb-3(b)(1), unless the authorization is terminated or revoked sooner. Performed at [facility], [place] [address] [place], [place] [address]   Blood culture (routine x 2)     Status: None (Preliminary result)   Collection Time: 02/04/19 12:24 PM  Result Value Ref Range Status   Specimen Description BLOOD RIGHT ANTECUBITAL  Final   Special Requests   Final    BOTTLES DRAWN AEROBIC AND ANAEROBIC Blood Culture adequate volume   Culture  Setup Time   Final    GRAM POSITIVE COCCI AEROBIC BOTTLE ONLY CRITICAL VALUE NOTED.  VALUE IS CONSISTENT WITH PREVIOUSLY REPORTED AND CALLED VALUE. Performed at [facility], [address] [place], [place] [address]    Culture [facility] POSITIVE COCCI  Final   Report Status PENDING  Incomplete  MRSA PCR Screening     Status: None   Collection Time: 02/04/19  4:51 PM  Result Value Ref Range Status   MRSA by PCR NEGATIVE NEGATIVE Final    Comment:        The GeneXpert MRSA Assay (FDA approved for NASAL specimens only), is one component of a comprehensive MRSA colonization surveillance program. It is not intended to diagnose MRSA infection nor to guide or monitor treatment for MRSA infections. Performed at [facility], [address] [place], [place] [address]     Coagulation Studies: No results for input(s): LABPROT, INR in the last 72 hours.  Urinalysis: No results for input(s):  COLORURINE, LABSPEC, PHURINE, GLUCOSEU, HGBUR, BILIRUBINUR, KETONESUR, PROTEINUR, UROBILINOGEN, NITRITE, LEUKOCYTESUR in the last 72 hours.  Invalid input(s): APPERANCEUR    Imaging: Dg Chest Port 1 View  Result Date: 02/05/2019 CLINICAL DATA:  Acute respiratory failure. EXAM: PORTABLE CHEST 1 VIEW COMPARISON:  One-view chest x-ray 02/04/2019 FINDINGS: Heart is mildly enlarged. Progressive interstitial and airspace disease is present with increasing consolidation at both lung bases. The endotracheal tube terminates 3 cm above the carina. NG tube terminates in the stomach. Right IJ dialysis catheter is stable. IMPRESSION: 1. Progressive interstitial and airspace disease  with increased consolidation of both lung bases. 2. Support apparatus is stable. Electronically Signed   By: San Morelle M.D.   On: 02/05/2019 06:00   Dg Chest Portable 1 View  Result Date: 02/04/2019 CLINICAL DATA:  43 year old male with shortness of breath EXAM: PORTABLE CHEST 1 VIEW COMPARISON:  01/27/2019, 12/24/2010 FINDINGS: Cardiomediastinal silhouette unchanged. Similar appearance of mixed interstitial and airspace opacities throughout the lungs with mild thickening of the minor fissure. No large pleural effusion. No pneumothorax. Right IJ hemodialysis catheter is unchanged. Endotracheal tube terminates 5.8 cm above the carina. Gastric tube terminates within the stomach. Stent graft of the left axillary vein/brachial vein IMPRESSION: Mixed interstitial and airspace opacities with the differential primarily including multifocal pneumonia (including atypical pathogens), and edema. Endotracheal tube terminates suitably above the carina. Right IJ hemodialysis catheter terminates at the superior vena cava. Gastric tube terminates in the stomach. Electronically Signed   By: Corrie Mckusick D.O.   On: 02/04/2019 13:44     Medications:   . sodium chloride    . sodium chloride    . sodium chloride    . ceFEPime (MAXIPIME) IV     . norepinephrine (LEVOPHED) Adult infusion Stopped (02/04/19 2350)  . propofol (DIPRIVAN) infusion 25 mcg/kg/min (02/05/19 1312)   . chlorhexidine gluconate (MEDLINE KIT)  15 mL Mouth Rinse BID  . Chlorhexidine Gluconate Cloth  6 each Topical Q0600  . heparin  5,000 Units Subcutaneous Q8H  . mouth rinse  15 mL Mouth Rinse 10 times per day  . sodium chloride flush  3 mL Intravenous Q12H  . vancomycin variable dose per unstable renal function (pharmacist dosing)   Does not apply See admin instructions   sodium chloride, sodium chloride, sodium chloride, acetaminophen **OR** acetaminophen, alteplase, fentaNYL (SUBLIMAZE) injection, fentaNYL (SUBLIMAZE) injection, heparin, lidocaine (PF), lidocaine-prilocaine, ondansetron **OR** ondansetron (ZOFRAN) IV, pentafluoroprop-tetrafluoroeth, sennosides, sodium chloride flush  Assessment/ Plan:  Mr. Cantrell Larouche is a 43 y.o. white male with end stage renal disease on hemodialysis, hypertension, depression, aortobifemoral bypass, admission for depression 3/20, now admitted with acute respiratory failure after missed dialysis.  Patient is homeless  [place]. TTS schedule.   1.  ESRD: Patient developed acute respiratory failure secondary to missed dialysis treatment.  He underwent urgent dialysis yesterday and tolerated well.  We will plan for another dialysis session tomorrow and try to get him back on his usual schedule on Saturday.  2.  Acute respiratory failure.  Patient had significantly elevated blood pressure upon admission.  However this did subsequently come down.  Weaning as per pulmonary/critical care.  3.  Positive blood culture.  Coagulation negative Staphylococcus noted.  ID to be consulted regarding this.  4.  Anemia of chronic kidney disease.  Hemoglobin 9.2.  Consider starting Epogen at the next dialysis treatment.  5.  Secondary hyperparathyroidism.  Phosphorus was a bit high yesterday at 6.7.  Reevaluate this  tomorrow.   LOS: 1 Axie Hayne 4/16/20202:19 PM

## 2019-02-05 NOTE — Progress Notes (Signed)
Patient ID: Brett Small, male   DOB: Sep 01, 1976, 43 y.o.   MRN: [id number]  [facility] PROGRESS NOTE  Brett Small WLS:[id number] DOB: Jan 19, 1976 DOA: 02/04/2019 PCP: Patient, [name]  HPI/Subjective: Patient on ventilator but able to follow commands.  He is able to squeeze my hands.  He shook his head yes that he missed some dialysis sessions.  No complaints of chest pain.  Had shortness of breath prior to coming into the hospital.  Objective: Vitals:   02/05/19 1100 02/05/19 1200  BP: 109/66 110/68  Pulse: (!) 56 (!) 56  Resp: 14 15  Temp:    SpO2: 100% 100%    Filed Weights   02/04/19 1600 02/04/19 1630 02/04/19 1941  Weight: 49 kg 47.8 kg 45.8 kg    ROS: Review of Systems  Unable to perform ROS: Intubated  Respiratory: Positive for shortness of breath.   Cardiovascular: Negative for chest pain.  Gastrointestinal: Negative for abdominal pain.   Exam: Physical Exam  HENT:  Nose: No mucosal edema.  Mouth/Throat: No oropharyngeal exudate or posterior oropharyngeal edema.  Eyes: Pupils are equal, round, and reactive to light. Conjunctivae, EOM and lids are normal.  Neck: No JVD present. Carotid bruit is not present. No edema present. No thyroid mass and no thyromegaly present.  Cardiovascular: S1 normal and S2 normal. Exam reveals no gallop.  No murmur heard. Pulses:      Dorsalis pedis pulses are 2+ on the right side and 2+ on the left side.  Respiratory: No respiratory distress. He has decreased breath sounds in the right lower field and the left lower field. He has no wheezes. He has no rhonchi. He has rales in the right lower field and the left lower field.  GI: Soft. Bowel sounds are normal. There is no abdominal tenderness.  Musculoskeletal:     Right ankle: He exhibits no swelling.     Left ankle: He exhibits no swelling.  Lymphadenopathy:    He has no cervical adenopathy.  Neurological: He is alert.  Able to squeeze hands to commands.  Skin: Skin  is warm. No rash noted. Nails show no clubbing.  Psychiatric:  Follows commands.      Data Reviewed: Basic Metabolic Panel: Recent Labs  Lab 02/04/19 1237 02/04/19 1526 02/04/19 1619 02/05/19 0251  NA 142  --   --  140  K 3.8  --   --  4.1  CL 106  --   --  97*  CO2 18*  --   --  23  GLUCOSE 158*  --   --  123*  BUN 73*  --   --  44*  CREATININE 9.19* 9.41*  --  5.84*  CALCIUM 8.3*  --   --  8.5*  PHOS  --   --  6.7*  --    Liver Function Tests: Recent Labs  Lab 02/04/19 1237  AST 31  ALT 12  ALKPHOS 82  BILITOT 1.3*  PROT 7.5  ALBUMIN 3.4*   CBC: Recent Labs  Lab 02/04/19 1237 02/04/19 1526 02/05/19 0251  WBC 14.4* 10.7* 6.2  NEUTROABS 11.8*  --   --   HGB 10.8* 9.4* 9.2*  HCT 34.0* 30.2* 29.1*  MCV 98.3 98.7 97.7  PLT 285 182 140*   Cardiac Enzymes: Recent Labs  Lab 02/04/19 1237  TROPONINI 0.07*   BNP (last 3 results) Recent Labs    12/24/18 2355  BNP 2,318.1*     CBG: Recent Labs  Lab 02/04/19  [place]*    Recent Results (from the past 240 hour(s))  MRSA PCR Screening     Status: Abnormal   Collection Time: 01/27/19  9:55 AM  Result Value Ref Range Status   MRSA by PCR POSITIVE (A) NEGATIVE Final    Comment:        The GeneXpert MRSA Assay (FDA approved for NASAL specimens only), is one component of a comprehensive MRSA colonization surveillance program. It is not intended to diagnose MRSA infection nor to guide or monitor treatment for MRSA infections. RESULT CALLED TO, READ BACK BY AND VERIFIED WITH: Nolberto Hanlon 01/27/19 1109 KLW Performed at [facility], [address] [place], [place] [address]   Blood culture (routine x 2)     Status: None (Preliminary result)   Collection Time: 02/04/19 12:19 PM  Result Value Ref Range Status   Specimen Description   Final    BLOOD RIGHT ANTECUBITAL Performed at [facility], [address] [place], [place] [address]    Special Requests   Final     BOTTLES DRAWN AEROBIC AND ANAEROBIC Blood Culture adequate volume Performed at [facility], [address] [place], [place] [address]    Culture  Setup Time   Final    GRAM POSITIVE COCCI ANAEROBIC BOTTLE ONLY CRITICAL RESULT CALLED TO, READ BACK BY AND VERIFIED WITH: [facility] DUNCAN AT 6468 02/05/2019 SDR Performed at [facility], [place] [address] [place], [place] [address]    Culture GRAM POSITIVE COCCI  Final   Report Status PENDING  Incomplete  Blood Culture ID Panel (Reflexed)     Status: Abnormal   Collection Time: 02/04/19 12:19 PM  Result Value Ref Range Status   Enterococcus species NOT DETECTED NOT DETECTED Final   Listeria monocytogenes NOT DETECTED NOT DETECTED Final   Staphylococcus species DETECTED (A) NOT DETECTED Final    Comment: Methicillin (oxacillin) resistant coagulase negative staphylococcus. Possible blood culture contaminant (unless isolated from more than one blood culture draw or clinical case suggests pathogenicity). No antibiotic treatment is indicated for blood  culture contaminants. CRITICAL RESULT CALLED TO, READ BACK BY AND VERIFIED WITH:  [facility] DUNCAN AT 2482 02/05/2019 SDR    Staphylococcus aureus (BCID) NOT DETECTED NOT DETECTED Final   Methicillin resistance DETECTED (A) NOT DETECTED Final    Comment: CRITICAL RESULT CALLED TO, READ BACK BY AND VERIFIED WITH:  ASAJAH DUNCAN AT 0715 02/05/2019 SDR    Streptococcus species NOT DETECTED NOT DETECTED Final   Streptococcus agalactiae NOT DETECTED NOT DETECTED Final   Streptococcus pneumoniae NOT DETECTED NOT DETECTED Final   Streptococcus pyogenes NOT DETECTED NOT DETECTED Final   Acinetobacter baumannii NOT DETECTED NOT DETECTED Final   Enterobacteriaceae species NOT DETECTED NOT DETECTED Final   Enterobacter cloacae complex NOT DETECTED NOT DETECTED Final   Escherichia coli NOT DETECTED NOT DETECTED Final   Klebsiella oxytoca NOT DETECTED NOT DETECTED Final   Klebsiella pneumoniae  NOT DETECTED NOT DETECTED Final   Proteus species NOT DETECTED NOT DETECTED Final   Serratia marcescens NOT DETECTED NOT DETECTED Final   Haemophilus influenzae NOT DETECTED NOT DETECTED Final   Neisseria meningitidis NOT DETECTED NOT DETECTED Final   Pseudomonas aeruginosa NOT DETECTED NOT DETECTED Final   Candida albicans NOT DETECTED NOT DETECTED Final   Candida glabrata NOT DETECTED NOT DETECTED Final   Candida krusei NOT DETECTED NOT DETECTED Final   Candida parapsilosis NOT DETECTED NOT DETECTED Final   Candida tropicalis NOT DETECTED NOT  DETECTED Final    Comment: Performed at [facility], [address] [place], [place] [address]  SARS Coronavirus 2 [facility] order, Performed in [facility] hospital lab)     Status: None   Collection Time: 02/04/19 12:20 PM  Result Value Ref Range Status   SARS Coronavirus 2 NEGATIVE NEGATIVE Final    Comment: (NOTE) If result is NEGATIVE SARS-CoV-2 target nucleic acids are NOT DETECTED. The SARS-CoV-2 RNA is generally detectable in upper and lower  respiratory specimens during the acute phase of infection. The lowest  concentration of SARS-CoV-2 viral copies this assay can detect is 250  copies / mL. A negative result does not preclude SARS-CoV-2 infection  and should not be used as the sole basis for treatment or other  patient management decisions.  A negative result may occur with  improper specimen collection / handling, submission of specimen other  than nasopharyngeal swab, presence of viral mutation(s) within the  areas targeted by this assay, and inadequate number of viral copies  (<250 copies / mL). A negative result must be combined with clinical  observations, patient history, and epidemiological information. If result is POSITIVE SARS-CoV-2 target nucleic acids are DETECTED. The SARS-CoV-2 RNA is generally detectable in upper and lower  respiratory specimens dur ing the acute phase of infection.  Positive  results  are indicative of active infection with SARS-CoV-2.  Clinical  correlation with patient history and other diagnostic information is  necessary to determine patient infection status.  Positive results do  not rule out bacterial infection or co-infection with other viruses. If result is PRESUMPTIVE POSTIVE SARS-CoV-2 nucleic acids MAY BE PRESENT.   A presumptive positive result was obtained on the submitted specimen  and confirmed on repeat testing.  While 2019 novel coronavirus  (SARS-CoV-2) nucleic acids may be present in the submitted sample  additional confirmatory testing may be necessary for epidemiological  and / or clinical management purposes  to differentiate between  SARS-CoV-2 and other Sarbecovirus currently known to infect humans.  If clinically indicated additional testing with an alternate test  methodology [phone]) is advised. The SARS-CoV-2 RNA is generally  detectable in upper and lower respiratory sp ecimens during the acute  phase of infection. The expected result is Negative. Fact Sheet for Patients:  StrictlyIdeas.no Fact Sheet for Healthcare Providers: BankingDealers.co.za This test is not yet approved or cleared by the [place] FDA and has been authorized for detection and/or diagnosis of SARS-CoV-2 by FDA under an Emergency Use Authorization (EUA).  This EUA will remain in effect (meaning this test can be used) for the duration of the COVID-19 declaration under Section 564(b)(1) of the Act, 21 U.S.C. section 360bbb-3(b)(1), unless the authorization is terminated or revoked sooner. Performed at [facility], [place] [address] [place], [place] [address]   Blood culture (routine x 2)     Status: None (Preliminary result)   Collection Time: 02/04/19 12:24 PM  Result Value Ref Range Status   Specimen Description BLOOD RIGHT ANTECUBITAL  Final   Special Requests   Final    BOTTLES DRAWN AEROBIC AND ANAEROBIC  Blood Culture adequate volume   Culture  Setup Time   Final    GRAM POSITIVE COCCI AEROBIC BOTTLE ONLY CRITICAL VALUE NOTED.  VALUE IS CONSISTENT WITH PREVIOUSLY REPORTED AND CALLED VALUE. Performed at [facility], [address] [place], [place] [address]    Culture [facility] POSITIVE COCCI  Final   Report Status PENDING  Incomplete  MRSA PCR  Screening     Status: None   Collection Time: 02/04/19  4:51 PM  Result Value Ref Range Status   MRSA by PCR NEGATIVE NEGATIVE Final    Comment:        The GeneXpert MRSA Assay (FDA approved for NASAL specimens only), is one component of a comprehensive MRSA colonization surveillance program. It is not intended to diagnose MRSA infection nor to guide or monitor treatment for MRSA infections. Performed at [facility], [address] [place], [place] [address]      Studies: Dg Chest [facility] 1 View  Result Date: 02/05/2019 CLINICAL DATA:  Acute respiratory failure. EXAM: PORTABLE CHEST 1 VIEW COMPARISON:  One-view chest x-ray 02/04/2019 FINDINGS: Heart is mildly enlarged. Progressive interstitial and airspace disease is present with increasing consolidation at both lung bases. The endotracheal tube terminates 3 cm above the carina. NG tube terminates in the stomach. Right IJ dialysis catheter is stable. IMPRESSION: 1. Progressive interstitial and airspace disease with increased consolidation of both lung bases. 2. Support apparatus is stable. Electronically Signed   By: San Morelle M.D.   On: 02/05/2019 06:00   Dg Chest Portable 1 View  Result Date: 02/04/2019 CLINICAL DATA:  43 year old male with shortness of breath EXAM: PORTABLE CHEST 1 VIEW COMPARISON:  01/27/2019, 12/24/2010 FINDINGS: Cardiomediastinal silhouette unchanged. Similar appearance of mixed interstitial and airspace opacities throughout the lungs with mild thickening of the minor fissure. No large pleural effusion. No pneumothorax. Right IJ hemodialysis  catheter is unchanged. Endotracheal tube terminates 5.8 cm above the carina. Gastric tube terminates within the stomach. Stent graft of the left axillary vein/brachial vein IMPRESSION: Mixed interstitial and airspace opacities with the differential primarily including multifocal pneumonia (including atypical pathogens), and edema. Endotracheal tube terminates suitably above the carina. Right IJ hemodialysis catheter terminates at the superior vena cava. Gastric tube terminates in the stomach. Electronically Signed   By: Corrie Mckusick D.O.   On: 02/04/2019 13:44    Scheduled Meds: . chlorhexidine gluconate (MEDLINE KIT)  15 mL Mouth Rinse BID  . Chlorhexidine Gluconate Cloth  6 each Topical Q0600  . heparin  5,000 Units Subcutaneous Q8H  . mouth rinse  15 mL Mouth Rinse 10 times per day  . sodium chloride flush  3 mL Intravenous Q12H  . vancomycin variable dose per unstable renal function (pharmacist dosing)   Does not apply See admin instructions   Continuous Infusions: . sodium chloride    . sodium chloride    . sodium chloride    . ceFEPime (MAXIPIME) IV    . norepinephrine (LEVOPHED) Adult infusion Stopped (02/04/19 2350)  . propofol (DIPRIVAN) infusion 20 mcg/kg/min (02/05/19 4854)    Assessment/Plan:  1. Acute hypoxic respiratory failure.  Patient intubated in the ER.  Patient currently on pressor support.  Hopefully will not have to remain on the ventilator too much longer.  Unlikely to be extubated today. 2. Acute pulmonary edema secondary to fluid overload.  Nephrology to set up dialysis. 3. Systemic inflammatory response.  Patient given IV vancomycin and cefepime.  Blood culture positive for gram-positive cocci in 1 bottle.  Could be skin contaminant.  COVID-19 negative. 4. End-stage renal disease.  Nephrology to set up dialysis. 5. Anemia of chronic disease 6. History of hypertension.  Hypotensive on presentation.  Continue to monitor off medication  Code Status:     Code  Status Orders  (From admission, onward)         Start     Ordered  02/04/19 1342  Full code  Continuous     02/04/19 1343        Code Status History    Date Active Date Inactive Code Status Order ID Comments User Context   01/27/2019 0654 01/29/2019 2049 Partial Code [id number]  Harrie Foreman, MD Inpatient   01/07/2019 2156 01/27/2019 0638 Partial Code [id number]  Tennis Ship, MD Inpatient   12/25/2018 0158 01/07/2019 2027 Partial Code [id number]  Ina Homes, MD ED     Family Communication: As per critical care specialist Disposition Plan: To be determined  Consultants:  Critical care specialist  Nephrology  Antibiotics:  Cefepime  Received 1 dose of vancomycin  Time spent: 27 minutes.  Case discussed with critical care specialist.  [facility]

## 2019-02-05 NOTE — Progress Notes (Signed)
Initial Nutrition Assessment  RD working remotely.  DOCUMENTATION CODES:   Underweight  INTERVENTION:  If patient remains intubated >24-48 hours, recommend initiating Osmolite 1.2 Cal at 45 mL/hr (1080 mL goal daily volume) + Pro-Stat 30 mL once daily per tube. Provides 1396 kcal, 73 grams of protein, 886 mL H2O daily.  If tube feeds are started recommend B-complex with C QHS per tube and a minimum free water flush of 30 mL Q4hrs to maintain tube patency.  Monitor magnesium, potassium, and phosphorus daily for at least 3 days, MD to replete as needed, as pt is at risk for refeeding syndrome.  NUTRITION DIAGNOSIS:   Inadequate oral intake related to inability to eat as evidenced by NPO status.  GOAL:   Provide needs based on ASPEN/SCCM guidelines  MONITOR:   Vent status, Labs, Weight trends, Skin, TF tolerance, I & O's  REASON FOR ASSESSMENT:   Ventilator    ASSESSMENT:   43 year old male with PMHx of HTN, ESRD on HD who is admitted with acute hypoxic respiratory failure requiring intubation on 4/15, severe pulmonary edema secondary to fluid overload, systemic inflammatory response, found to be negative for COVID-19.   Patient intubated and on propofol gtt. On PSV mode with FiO2 35%, Pressure Support 15 cmH2O, PEEP 5 cmH2O. Abdomen soft per RN documentation. Lats BM unknown/PTA. Noted in chart patient is homeless.   Enteral Access: OGT placed 4/15; terminate sin stomach per chest x-ray 4/16; 58 cm at corner of mouth  MAP: 71-85 mmHg  Patient is currently intubated on ventilator support Ve: 7.7 L/min Temp (24hrs), Avg:98 F (36.7 C), Min:97.4 F (36.3 C), Max:98.4 F (36.9 C)  Propofol: 10.5 mL/hr (277 kcal daily)  Medications reviewed and include: cefepime, propofol gtt.  Labs reviewed: Chloride 97, BUN 44, Creatinine 5.04, Phosphorus 6.7. Potassium WNL.  I/O: 22 mL UOP yesterday; 2146 mL removed from HD last night  Patient is at risk for  malnutrition.  NUTRITION - FOCUSED PHYSICAL EXAM:  Unable to complete at this time.  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  Estimated Nutritional Needs:   Kcal:  0165 (PSU 2003b w/ MSJ 1226, Ve 7.7, Tmax 36.9)  Protein:  70-80 grams (1.5-1.8 grams/kg)  Fluid:  UOP + 1 L  Willey Blade, MS, RD, LDN Office: [phone] Pager: [phone] After Hours/Weekend Pager: [phone]

## 2019-02-05 NOTE — Progress Notes (Signed)
Remains intubated Did not tolerate SBT Comfortable on PSV 15 cm H2O Sedated, minimally responsive  Vitals:   02/05/19 1000 02/05/19 1100 02/05/19 1200 02/05/19 1300  BP: 106/66 109/66 110/68 115/69  Pulse: (!) 56 (!) 56 (!) 56 (!) 57  Resp: _0 Temp:      TempSrc:      SpO2: 100% 100% 100% 100%  Weight:      Height:       Vent Mode: PSV FiO2 (%):  [35 %-80 %] 35 % Set Rate:  [14 bmp-16 bmp] 14 bmp Vt Set:  [500 mL] 500 mL PEEP:  [5 cmH20] 5 cmH20 Pressure Support:  [15 cmH20] 15 cmH20  Chronically ill-appearing HEENT: NCAT, sclerae white Neck supple.  JVP not visualized Chest clear anteriorly Regular, no M NABS, soft Extremities warm, no edema LUE AVG present No focal neurologic deficits noted  BMP Latest Ref Rng & Units 02/05/2019 02/04/2019 02/04/2019  Glucose 70 - 99 mg/dL 123(H) - 158(H)  BUN 6 - 20 mg/dL 44(H) - 73(H)  Creatinine 0.61 - 1.24 mg/dL 5.84(H) 9.41(H) 9.19(H)  Sodium 135 - 145 mmol/L 140 - 142  Potassium 3.5 - 5.1 mmol/L 4.1 - 3.8  Chloride 98 - 111 mmol/L 97(L) - 106  CO2 22 - 32 mmol/L 23 - 18(L)  Calcium 8.9 - 10.3 mg/dL 8.5(L) - 8.3(L)    CBC Latest Ref Rng & Units 02/05/2019 02/04/2019 02/04/2019  WBC 4.0 - 10.5 K/uL 6.2 10.7(H) 14.4(H)  Hemoglobin 13.0 - 17.0 g/dL 9.2(L) 9.4(L) 10.8(L)  Hematocrit 39.0 - 52.0 % 29.1(L) 30.2(L) 34.0(L)  Platelets 150 - 400 K/uL 140(L) 182 285   Hepatic Function Latest Ref Rng & Units 02/04/2019 01/27/2019 01/24/2019  Total Protein 6.5 - 8.1 g/dL 7.5 6.9 -  Albumin 3.5 - 5.0 g/dL 3.4(L) 3.4(L) 3.0(L)  AST 15 - 41 U/L 31 17 -  ALT 0 - 44 U/L 12 10 -  Alk Phosphatase 38 - 126 U/L 82 71 -  Total Bilirubin 0.3 - 1.2 mg/dL 1.3(H) 0.7 -   CXR: Persistent edema pattern with partial clearing on L.  LLL atelectasis    INDWELLING DEVICES: R tunneled HD cath (chronic) ETT 04/15 >>   MICRO DATA: SARSCoV2 04/15 >> NEG MRSA PCR 04/15 >> NEG Blood 2/2 >> coag neg staph  ANTIMICROBIALS:  Cefepime 04/15 >>  04/16 Vanc 04/15 >>     IMPRESSION: Acute hypoxemic respiratory failure Pulmonary edema Left lower lobe atelectasis Doubt pneumonia ESRD Coag neg staph bacteremia Protein-calorie malnutrition Anemia related to CKD - no overt blood loss ICU/vent associated discomfort  PLAN/REC: Cont vent support - settings reviewed and/or adjusted PSV weaning as tolerated Cont vent bundle Daily SBT if/when meets criteria HD per nephrology - next planned 04/17 Initiate TF protocol SUP: IV pantoprazole (may be DC'd after extubation) Monitor temp, WBC count Micro and abx as above ID consultation 04/16 Will likely need Permacath removed (has AVG that reoprtedly has never been used) DVT px: SCDs. Resume apixaban 04/16 Monitor CBC intermittently Transfuse per usual guidelines  RASS goal 0, -1 Intermittent fentanyl PRN Cont propofol infusion   NOTE: pt is chronically on apixaban. Appears to have been initiated @ [facility]. Absolute indication is not clear... Perhaps, severe PVD previously requiring thromboembolectomy   Merton Border, MD PCCM service Mobile [phone] Pager [phone] 02/05/2019 2:31 PM

## 2019-02-05 NOTE — Consult Note (Addendum)
Pharmacy Antibiotic Note  Brett Small is a 43 y.o. male admitted on 02/04/2019 with HAP.  Pharmacy has been consulted for Vancomycin and Cefepime dosing. Patient has already received a dose of Cefepime and Vancomycin in the ED. Patient receives dialysis and is currently intubated. ID will be consulted.   Plan: Pt received vancomycin 1 g IV x1 loading dose on 4/15. Based on pt wt, will need vancomycin 500 mg IV after each HD session. HD schedule unclear. Pt received full HD session on 4/15 per RN and Vancomycin 500 mg x 1.   Per CCM rounds, PermCath potentially pulled today, and will continue Vancomycin for potential line infection. Planning for HD tomorrow, 4/17 and to return to normal TTHS schedule on Saturday, 4/18.   BCID updated - now 2/4 GPCs (was anaerobic this AM) - CCM aware  Height: 5\' 2"  (157.5 cm) Weight: 100 lb 15.5 oz (45.8 kg) IBW/kg (Calculated) : 54.6  Temp (24hrs), Avg:98 F (36.7 C), Min:97.4 F (36.3 C), Max:98.4 F (36.9 C)  Recent Labs  Lab 02/04/19 1237 02/04/19 1526 02/05/19 0251  WBC 14.4* 10.7* 6.2  CREATININE 9.19* 9.41* 5.84*  LATICACIDVEN 4.6* 1.4  --     Estimated Creatinine Clearance: 10.3 mL/min (A) (by C-G formula based on SCr of 5.84 mg/dL (H)).    Allergies  Allergen Reactions  . Codeine Nausea Only    Patient questioned this (??)  . Sulfa Antibiotics Hives and Nausea And Vomiting    Antimicrobials this admission: 4/15 Cefepime >> 4/16 4/15 Vancomycin >>   Dose adjustments this admission: N/A  Microbiology results: 4/15 BCx: 2/4 GPCs  (patient does have PermCath)  4/15 COVID: NEGATIVE 4/15 MRSA PCR: (-)   Thank you for allowing pharmacy to be a part of this patient's care.   Paticia Stack, PharmD Pharmacy Resident  02/05/2019 11:40 AM

## 2019-02-05 NOTE — Consult Note (Signed)
NAME: Brett Small  DOB: 1976-05-04  MRN: [id number]  Date/Time: 02/05/2019 8:04 PM  REQUESTING PROVIDER:simonds Subjective:  REASON FOR CONSULT: coag neg staph bacteremia ?No history available from patient as he is intubated- chart reviewed including care everywhere Brett Small is a 43 y.o. 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 He is intubated  pt has been getting dialysis thru a rt permactah since sept 2019. He had a graft placed on 12/26/18. He was in the behavioral until between 3/18- 01/26/19 for suicidal ideation but more so for social issues, homelessness and corona virus pandemic. He was then transferred to medical unit  4/7 with SOB and was noted to have pulmonary edema and he was dilaysed. He was discharged on 4/9 and came to the ED on 4/15 with increased SOB after missing dialysis X 2  Pulse ox was 60% and he was placed on NRB. He was intubated .  His BP was high at 220/123. In the ER he received iv labetalol due to HTN, lasix, cefepime, and solumedrol.and was sent to ICU. HE underwent dialysis. I am asked to see him as Blood culture positive for coag neg staph.     Medical history Anemia 10/01/2018  . Anxiety  . Aortobifemoral bypass graft thrombosis (CMS/HCC)  . Arthritis  . Clotting disorder (CMS/HCC)  . ESRD on hemodialysis (CMS/HCC)  . GERD (gastroesophageal reflux disease)  . HFrEF (heart failure with reduced ejection fraction) (CMS/HCC)  EF 35-40% Unknown etiology  . HLD (hyperlipidemia)  . HTN (hypertension)  . Myocardial infarction (CMS/HCC)  pt denies  . PAD (peripheral artery disease) (CMS/HCC)  . Tobacco abuse   SUR - AMPUTATION TOE  Left 5th toe   SUR - BYPASS AORTO FEMORAL 04/23/2017 Bilateral Dr Greggory Brandy [facility]  [place], 12 x 6 hemashield, ? end to side proximally to occluded aorta.   SUR - BYPASS AORTO FEMORAL 02/25/2018 Bilateral Dr. Lauree Chandler Left retroperitoneal approach 14 x 7 gelsoft.   SUR - THROMBECTOMY EXTREMITY 12/30/2017 Bilateral [place] Dr. Karie Chimera, rupture right limb, 7 x 58 lifestream covered stent right limb, left femoral endarterectomy   SUR - BYPASS AORTO FEMORAL 07/01/2018 Bilateral thrombectomy bilateral iliac stents, SMA stent, B/L CFA endart. Sammuel Hines, [place] B[place] 9/10 2019  ? SMA [place]   SUR - THROMBECTOMY EXTREMITY 24 Oct 2018 Left left limb aorta bi fem graft.    SUR - ANGIOPLASTY BALLOON 24 Oct 2018 N/A covered stnet 11 x 14 VBX, post dilated to 12 mm   SUR - BYPASS FEMORAL FEMORAL 10/24/2018 N/A B-FEMORAL GRAFT THROMBECTOMY; AORTA ANGIOPLASTY AND STENT; IVUS, AORTAGRAM performed by Tildon Husky, MD at [place] from this surgery are in the Implants section.    12/26/18 INSERTION OF GORE STRETCH VASCULAR 4-7MM X 45CM IN LEFT UPPER ARM  Past Surgical History:  Procedure Laterality Date  . A/V SHUNT INTERVENTION Left 01/19/2019   Procedure: LEFT UPPER EXTREMITY A/V SHUNTOGRAM / UPPER EXTREMITY ANGIOGRAM;  Surgeon: Algernon Huxley, MD;  Location: [facility];  Service: Cardiovascular;  Laterality: Left;  . AORTA - FEMORAL ARTERY BYPASS GRAFT    . AV FISTULA PLACEMENT Left 12/26/2018   Procedure: INSERTION OF GORE STRETCH VASCULAR 4-7MM X  45CM IN  LEFT UPPER ARM;  Surgeon: Marty Heck, MD;  Location: [facility];  Service: Vascular;  Laterality: Left;   [place] Former smoker   Family History  Problem Relation Age of Onset  . Hypertension Other   . Diabetes Other   . Clotting disorder Father    Allergies  Allergen Reactions  . Codeine Nausea Only    Patient questioned this (??)  . Sulfa Antibiotics Hives and Nausea And Vomiting    ? Current Facility-Administered Medications  Medication Dose Route  Frequency Provider Last Rate Last Dose  . 0.9 %  sodium chloride infusion  250 mL Intravenous PRN Pyreddy, Pavan, MD      . 0.9 %  sodium chloride infusion  100 mL Intravenous PRN Lateef, Munsoor, MD      . acetaminophen (TYLENOL) tablet 650 mg  650 mg Per Tube Q6H PRN Wilhelmina Mcardle, MD       Or  . acetaminophen (TYLENOL) suppository 650 mg  650 mg Rectal Q6H PRN Wilhelmina Mcardle, MD      . alteplase (CATHFLO ACTIVASE) injection 2 mg  2 mg Intracatheter Once PRN Lateef, Munsoor, MD      . apixaban (ELIQUIS) tablet 2.5 mg  2.5 mg Oral BID Paticia Stack, RPH      . chlorhexidine gluconate (MEDLINE KIT) (PERIDEX) 0.12 % solution 15 mL  15 mL Mouth Rinse BID Awilda Bill, NP   15 mL at 02/05/19 1000  . Chlorhexidine Gluconate Cloth 2 % PADS 6 each  6 each Topical Q0600 Anthonette Legato, MD   6 each at 02/05/19 0502  . fentaNYL (SUBLIMAZE) injection 25-100 mcg  25-100 mcg Intravenous Q30 min PRN Wilhelmina Mcardle, MD      . heparin injection 1,000 Units  1,000 Units Dialysis PRN Lateef, Munsoor, MD      . lidocaine (PF) (XYLOCAINE) 1 % injection 5 mL  5 mL Intradermal PRN Lateef, Munsoor, MD      . lidocaine-prilocaine (EMLA) cream 1 application  1 application Topical PRN Lateef, Munsoor, MD      . MEDLINE mouth rinse  15 mL Mouth Rinse 10 times per day Awilda Bill, NP   15 mL at 02/05/19 1720  . norepinephrine (LEVOPHED) '4mg'$  in 281m premix infusion  0-40 mcg/min Intravenous Titrated SWilhelmina Mcardle MD   Stopped at 02/04/19 2350  . ondansetron (ZOFRAN) injection 4 mg  4 mg Intravenous Q6H PRN Pyreddy, Pavan, MD      . pantoprazole (PROTONIX) injection 40 mg  40 mg Intravenous Q24H SWilhelmina Mcardle MD   40 mg at 02/05/19 1445  . pentafluoroprop-tetrafluoroeth (GEBAUERS) aerosol 1 application  1 application Topical PRN Lateef, Munsoor, MD      . propofol (DIPRIVAN) 1000 MG/100ML infusion  0-50 mcg/kg/min Intravenous Continuous BAwilda Bill NP 10.5 mL/hr at 02/05/19 1830 25  mcg/kg/min at 02/05/19 1830  . sennosides (SENOKOT) 8.8 MG/5ML syrup 5 mL  5 mL Per Tube BID PRN BAwilda Bill NP      . sodium chloride flush (NS) 0.9 % injection 3 mL  3 mL Intravenous Q12H PSaundra Shelling MD   3 mL at 02/04/19 2202  . sodium chloride flush (NS) 0.9 % injection 3 mL  3 mL Intravenous PRN Pyreddy, Pavan, MD      . vancomycin variable dose per unstable renal function (pharmacist dosing)   Does not apply See admin instructions DRowland Lathe RPH         Abtx:  Anti-infectives (From admission, onward)   Start     Dose/Rate Route Frequency Ordered Stop   02/05/19 1400  ceFEPIme (MAXIPIME) 1 g in sodium chloride 0.9 % 100 mL IVPB  Status:  Discontinued     1 g 200 mL/hr over 30 Minutes Intravenous Every 24 hours 02/04/19 1521 02/05/19 1427   02/04/19 2000  vancomycin (VANCOCIN) 500 mg in sodium chloride 0.9 % 100 mL IVPB     500 mg 100 mL/hr over 60 Minutes Intravenous  Once 02/04/19 1951 02/04/19 2142   02/04/19 1519  vancomycin variable dose per unstable renal function (pharmacist dosing)      Does not apply See admin instructions 02/04/19 1521     02/04/19 1345  ceFEPIme (MAXIPIME) 1 g in sodium chloride 0.9 % 100 mL IVPB     1 g 200 mL/hr over 30 Minutes Intravenous  Once 02/04/19 1330 02/04/19 1424   02/04/19 1345  vancomycin (VANCOCIN) IVPB 1000 mg/200 mL premix     1,000 mg 200 mL/hr over 60 Minutes Intravenous  Once 02/04/19 1330 02/04/19 1645      REVIEW OF SYSTEMS:  NA Objective:  VITALS:  BP 117/68   Pulse 60   Temp (!) 97.3 F (36.3 C) (Axillary)   Resp 16   Ht '5\' 2"'$  (1.575 m)   Wt 45.8 kg   SpO2 100%   BMI 18.47 kg/m  PHYSICAL EXAM:  General: intubated on propafol  Head: Normocephalic, without obvious abnormality, atraumatic. Eyes: Conjunctivae clear, anicteric sclerae. Pupils are equal ENT cannot examine Neck: Supple, rt subclavian permacath Left EJ line Back: did not examine Lungsb/l air entry  Heart: systolic murmur Abdomen:  Soft,  Foley catheter Extremities:surgical scar over femoral area- 5th toe amputated Skin: No rashes or lesions. Or bruising Lymph: Cervical, supraclavicular normal. Neurologic:cannot examine Pertinent Labs Lab Results CBC    Component Value Date/Time   WBC 6.2 02/05/2019 0251   RBC 2.98 (L) 02/05/2019 0251   HGB 9.2 (L) 02/05/2019 0251   HCT 29.1 (L) 02/05/2019 0251   PLT 140 (L) 02/05/2019 0251   MCV 97.7 02/05/2019 0251   MCH 30.9 02/05/2019 0251   MCHC 31.6 02/05/2019 0251   RDW 17.5 (H) 02/05/2019 0251   LYMPHSABS 1.5 02/04/2019 1237   MONOABS 0.8 02/04/2019 1237   EOSABS 0.1 02/04/2019 1237   BASOSABS 0.1 02/04/2019 1237    CMP Latest Ref Rng & Units 02/05/2019 02/04/2019 02/04/2019  Glucose 70 - 99 mg/dL 123(H) - 158(H)  BUN 6 - 20 mg/dL 44(H) - 73(H)  Creatinine 0.61 - 1.24 mg/dL 5.84(H) 9.41(H) 9.19(H)  Sodium 135 - 145 mmol/L 140 - 142  Potassium 3.5 - 5.1 mmol/L 4.1 - 3.8  Chloride 98 - 111 mmol/L 97(L) - 106  CO2 22 - 32 mmol/L 23 - 18(L)  Calcium 8.9 - 10.3 mg/dL 8.5(L) - 8.3(L)  Total Protein 6.5 - 8.1 g/dL - - 7.5  Total Bilirubin 0.3 - 1.2 mg/dL - - 1.3(H)  Alkaline Phos 38 - 126 U/L - - 82  AST 15 - 41 U/L - - 31  ALT 0 - 44 U/L - - 12      Microbiology: Recent Results (from the past 240 hour(s))  MRSA PCR Screening     Status: Abnormal   Collection Time: 01/27/19  9:55 AM  Result Value Ref Range Status   MRSA by PCR POSITIVE (A) NEGATIVE Final    Comment:        The GeneXpert MRSA Assay (FDA approved  for NASAL specimens only), is one component of a comprehensive MRSA colonization surveillance program. It is not intended to diagnose MRSA infection nor to guide or monitor treatment for MRSA infections. RESULT CALLED TO, READ BACK BY AND VERIFIED WITH: Nolberto Hanlon 01/27/19 1109 KLW Performed at [facility], [address] [place], [place] [address]   Blood culture (routine x 2)     Status: None (Preliminary result)   Collection  Time: 02/04/19 12:19 PM  Result Value Ref Range Status   Specimen Description   Final    BLOOD RIGHT ANTECUBITAL Performed at [facility], [address] [place], [place] [address]    Special Requests   Final    BOTTLES DRAWN AEROBIC AND ANAEROBIC Blood Culture adequate volume Performed at [facility], [address], [place], [place] [address]    Culture  Setup Time   Final    GRAM POSITIVE COCCI ANAEROBIC BOTTLE ONLY CRITICAL RESULT CALLED TO, READ BACK BY AND VERIFIED WITH: [facility] DUNCAN AT 2162 02/05/2019 SDR Performed at [facility], [place] [address] [place], [place] [address]    Culture GRAM POSITIVE COCCI  Final   Report Status PENDING  Incomplete  Blood Culture ID Panel (Reflexed)     Status: Abnormal   Collection Time: 02/04/19 12:19 PM  Result Value Ref Range Status   Enterococcus species NOT DETECTED NOT DETECTED Final   Listeria monocytogenes NOT DETECTED NOT DETECTED Final   Staphylococcus species DETECTED (A) NOT DETECTED Final    Comment: Methicillin (oxacillin) resistant coagulase negative staphylococcus. Possible blood culture contaminant (unless isolated from more than one blood culture draw or clinical case suggests pathogenicity). No antibiotic treatment is indicated for blood  culture contaminants. CRITICAL RESULT CALLED TO, READ BACK BY AND VERIFIED WITH:  [facility] DUNCAN AT 0722 02/05/2019 SDR    Staphylococcus aureus (BCID) NOT DETECTED NOT DETECTED Final   Methicillin resistance DETECTED (A) NOT DETECTED Final    Comment: CRITICAL RESULT CALLED TO, READ BACK BY AND VERIFIED WITH:  ASAJAH DUNCAN AT 0715 02/05/2019 SDR    Streptococcus species NOT DETECTED NOT DETECTED Final   Streptococcus agalactiae NOT DETECTED NOT DETECTED Final   Streptococcus pneumoniae NOT DETECTED NOT DETECTED Final   Streptococcus pyogenes NOT DETECTED NOT DETECTED Final   Acinetobacter baumannii NOT DETECTED NOT DETECTED Final   Enterobacteriaceae  species NOT DETECTED NOT DETECTED Final   Enterobacter cloacae complex NOT DETECTED NOT DETECTED Final   Escherichia coli NOT DETECTED NOT DETECTED Final   Klebsiella oxytoca NOT DETECTED NOT DETECTED Final   Klebsiella pneumoniae NOT DETECTED NOT DETECTED Final   Proteus species NOT DETECTED NOT DETECTED Final   Serratia marcescens NOT DETECTED NOT DETECTED Final   Haemophilus influenzae NOT DETECTED NOT DETECTED Final   Neisseria meningitidis NOT DETECTED NOT DETECTED Final   Pseudomonas aeruginosa NOT DETECTED NOT DETECTED Final   Candida albicans NOT DETECTED NOT DETECTED Final   Candida glabrata NOT DETECTED NOT DETECTED Final   Candida krusei NOT DETECTED NOT DETECTED Final   Candida parapsilosis NOT DETECTED NOT DETECTED Final   Candida tropicalis NOT DETECTED NOT DETECTED Final    Comment: Performed at [facility], [address] [place], [place] [address]  SARS Coronavirus 2 [facility] order, Performed in [place] hospital lab)     Status: None   Collection Time: 02/04/19 12:20 PM  Result Value Ref Range Status   SARS Coronavirus 2 NEGATIVE NEGATIVE Final    Comment: (NOTE) If result  is NEGATIVE SARS-CoV-2 target nucleic acids are NOT DETECTED. The SARS-CoV-2 RNA is generally detectable in upper and lower  respiratory specimens during the acute phase of infection. The lowest  concentration of SARS-CoV-2 viral copies this assay can detect is 250  copies / mL. A negative result does not preclude SARS-CoV-2 infection  and should not be used as the sole basis for treatment or other  patient management decisions.  A negative result may occur with  improper specimen collection / handling, submission of specimen other  than nasopharyngeal swab, presence of viral mutation(s) within the  areas targeted by this assay, and inadequate number of viral copies  (<250 copies / mL). A negative result must be combined with clinical  observations, patient history, and  epidemiological information. If result is POSITIVE SARS-CoV-2 target nucleic acids are DETECTED. The SARS-CoV-2 RNA is generally detectable in upper and lower  respiratory specimens dur ing the acute phase of infection.  Positive  results are indicative of active infection with SARS-CoV-2.  Clinical  correlation with patient history and other diagnostic information is  necessary to determine patient infection status.  Positive results do  not rule out bacterial infection or co-infection with other viruses. If result is PRESUMPTIVE POSTIVE SARS-CoV-2 nucleic acids MAY BE PRESENT.   A presumptive positive result was obtained on the submitted specimen  and confirmed on repeat testing.  While 2019 novel coronavirus  (SARS-CoV-2) nucleic acids may be present in the submitted sample  additional confirmatory testing may be necessary for epidemiological  and / or clinical management purposes  to differentiate between  SARS-CoV-2 and other Sarbecovirus currently known to infect humans.  If clinically indicated additional testing with an alternate test  methodology [phone]) is advised. The SARS-CoV-2 RNA is generally  detectable in upper and lower respiratory sp ecimens during the acute  phase of infection. The expected result is Negative. Fact Sheet for Patients:  StrictlyIdeas.no Fact Sheet for Healthcare Providers: BankingDealers.co.za This test is not yet approved or cleared by the [place] FDA and has been authorized for detection and/or diagnosis of SARS-CoV-2 by FDA under an Emergency Use Authorization (EUA).  This EUA will remain in effect (meaning this test can be used) for the duration of the COVID-19 declaration under Section 564(b)(1) of the Act, 21 U.S.C. section 360bbb-3(b)(1), unless the authorization is terminated or revoked sooner. Performed at [facility], [place] [address] [place], [place] [address]   Blood culture  (routine x 2)     Status: None (Preliminary result)   Collection Time: 02/04/19 12:24 PM  Result Value Ref Range Status   Specimen Description BLOOD RIGHT ANTECUBITAL  Final   Special Requests   Final    BOTTLES DRAWN AEROBIC AND ANAEROBIC Blood Culture adequate volume   Culture  Setup Time   Final    GRAM POSITIVE COCCI AEROBIC BOTTLE ONLY CRITICAL VALUE NOTED.  VALUE IS CONSISTENT WITH PREVIOUSLY REPORTED AND CALLED VALUE. Performed at [facility], [address] [place], [place] [address]    Culture [facility] POSITIVE COCCI  Final   Report Status PENDING  Incomplete  MRSA PCR Screening     Status: None   Collection Time: 02/04/19  4:51 PM  Result Value Ref Range Status   MRSA by PCR NEGATIVE NEGATIVE Final    Comment:        The GeneXpert MRSA Assay (FDA approved for NASAL specimens only), is one component of a comprehensive MRSA colonization surveillance program. It is not intended  to diagnose MRSA infection nor to guide or monitor treatment for MRSA infections. Performed at [facility], [address] [place], [place] [address]    2 d echo from   01/27/19 Moderate hypokinesis of the left ventricular, entire septal wall.  2. The left ventricle has low normal systolic function, with an ejection fraction of 50-55%. The cavity size was normal. Left ventricular diastolic function could not be evaluated due to nondiagnostic images.    IMAGING RESULTS:  I have personally reviewed the films ? Impression/Recommendation ?Kaidan Harpster is a 43 y.o. 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 He is intubated ? ?Pulmonary edema due to High BP, missing dialysis- intubated and underwent dialysis. No pneumonia-   Coag neg staph in  blood culture from the same site rt antecubital- not sure whether it is contaminant or true pathogen especially with permacath Will repeat blood culture from [facility] and periphery and decide whether this a true pathogen As he has a left AV graft ( placed 12/26/18)  If  able to access that  permacath can be removed- agree with just continuing vanco and Dc cefepime  Malignant HTN leading to flash pulmonary edema  Could he have RAS?  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

## 2019-02-05 NOTE — Consult Note (Addendum)
ANTICOAGULATION CONSULT NOTE - Initial Consult  Pharmacy Consult for Apixaban Indication: thrombosis complication during bypass  Allergies  Allergen Reactions  . Codeine Nausea Only    Patient questioned this (??)  . Sulfa Antibiotics Hives and Nausea And Vomiting    Patient Measurements: Height: 5\' 2"  (157.5 cm) Weight: 100 lb 15.5 oz (45.8 kg) IBW/kg (Calculated) : 54.6   Vital Signs: Temp: 98.1 F (36.7 C) (04/16 0400) Temp Source: Oral (04/16 0400) BP: 115/69 (04/16 1300) Pulse Rate: 57 (04/16 1300)  Labs: Recent Labs    02/04/19 1237 02/04/19 1526 02/05/19 0251  HGB 10.8* 9.4* 9.2*  HCT 34.0* 30.2* 29.1*  PLT 285 182 140*  CREATININE 9.19* 9.41* 5.84*  TROPONINI 0.07*  --   --     Estimated Creatinine Clearance: 10.3 mL/min (A) (by C-G formula based on SCr of 5.84 mg/dL (H)).   Medical History: Past Medical History:  Diagnosis Date  . Hypertension   . 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 Patient was not felt to be a candidate for warfarin as he is currently homeless and warfarin requires frequent INR monitoring.  Goal of Therapy:  Monitor platelets by anticoagulation protocol: Yes   Plan: Will continue apixaban 2.5 mg BID. Although apixaban is only reduced to 2.5 mg BID for atrial fibrillation only, patient is ESRD and 46 kg. Will continue apixaban home dose starting tonight at 2200 as patient received SQ heparin at 1429. CBC ordered with AM labs.  Pharmacy will continue to monitor.  Paticia Stack, PharmD Pharmacy Resident  02/05/2019 2:45 PM

## 2019-02-06 ENCOUNTER — Inpatient Hospital Stay: Payer: Medicare Other | Admitting: Anesthesiology

## 2019-02-06 ENCOUNTER — Encounter
Admission: EM | Disposition: A | Discharge status: Home / Self Care | Payer: Self-pay | Source: Home / Self Care | Attending: Internal Medicine

## 2019-02-06 ENCOUNTER — Inpatient Hospital Stay: Payer: Medicare Other

## 2019-02-06 DIAGNOSIS — Z9889 Other specified postprocedural states: Secondary | ICD-10-CM

## 2019-02-06 DIAGNOSIS — N186 End stage renal disease: Secondary | ICD-10-CM

## 2019-02-06 DIAGNOSIS — B9689 Other specified bacterial agents as the cause of diseases classified elsewhere: Secondary | ICD-10-CM

## 2019-02-06 DIAGNOSIS — J9600 Acute respiratory failure, unspecified whether with hypoxia or hypercapnia: Secondary | ICD-10-CM

## 2019-02-06 DIAGNOSIS — Z992 Dependence on renal dialysis: Secondary | ICD-10-CM

## 2019-02-06 DIAGNOSIS — J9601 Acute respiratory failure with hypoxia: Secondary | ICD-10-CM

## 2019-02-06 HISTORY — PX: DIALYSIS/PERMA CATHETER REMOVAL: CATH118289

## 2019-02-06 LAB — BASIC METABOLIC PANEL
Anion gap: 18 — ABNORMAL HIGH (ref 5–15)
BUN: 73 mg/dL — ABNORMAL HIGH (ref 6–20)
CO2: 24 mmol/L (ref 22–32)
Calcium: 7.7 mg/dL — ABNORMAL LOW (ref 8.9–10.3)
Chloride: 99 mmol/L (ref 98–111)
Creatinine, Ser: 7.76 mg/dL — ABNORMAL HIGH (ref 0.61–1.24)
GFR calc Af Amer: 9 mL/min — ABNORMAL LOW (ref >60)
GFR calc non Af Amer: 8 mL/min — ABNORMAL LOW (ref >60)
Glucose, Bld: 92 mg/dL (ref 70–99)
Potassium: 3.9 mmol/L (ref 3.5–5.1)
Sodium: 141 mmol/L (ref 135–145)

## 2019-02-06 LAB — CBC
HCT: 25.8 % — ABNORMAL LOW (ref 39.0–52.0)
Hemoglobin: 8.2 g/dL — ABNORMAL LOW (ref 13.0–17.0)
MCH: 30.7 pg (ref 26.0–34.0)
MCHC: 31.8 g/dL (ref 30.0–36.0)
MCV: 96.6 fL (ref 80.0–100.0)
Platelets: 166 10*3/uL (ref 150–400)
RBC: 2.67 MIL/uL — ABNORMAL LOW (ref 4.22–5.81)
RDW: 17.8 % — ABNORMAL HIGH (ref 11.5–15.5)
WBC: 6.1 10*3/uL (ref 4.0–10.5)
nRBC: 0 % (ref 0.0–0.2)

## 2019-02-06 LAB — TRIGLYCERIDES: Triglycerides: 239 mg/dL — ABNORMAL HIGH (ref <150)

## 2019-02-06 SURGERY — DIALYSIS/PERMA CATHETER REMOVAL

## 2019-02-06 SURGERY — DIALYSIS/PERMA CATHETER REMOVAL
Anesthesia: LOCAL

## 2019-02-06 MED ORDER — ORAL CARE MOUTH RINSE
15.0000 mL | Freq: Two times a day (BID) | OROMUCOSAL | Status: DC
  Administered 2019-02-06 – 2019-02-07 (×2): 15 mL via OROMUCOSAL

## 2019-02-06 MED ORDER — VANCOMYCIN HCL 500 MG IV SOLR
500.0000 mg | INTRAVENOUS | Status: DC
  Filled 2019-02-06: qty 500

## 2019-02-06 MED ORDER — PHENOL 1.4 % MT LIQD
1.0000 | OROMUCOSAL | Status: DC | PRN
  Filled 2019-02-06: qty 177

## 2019-02-06 MED ORDER — CHLORHEXIDINE GLUCONATE CLOTH 2 % EX PADS
6.0000 | MEDICATED_PAD | Freq: Every day | CUTANEOUS | Status: DC
  Administered 2019-02-06 – 2019-02-09 (×2): 6 via TOPICAL

## 2019-02-06 MED ORDER — SODIUM CHLORIDE 0.9 % IV SOLN: INTRAVENOUS | Status: DC

## 2019-02-06 MED ORDER — VANCOMYCIN HCL 500 MG IV SOLR
500.0000 mg | INTRAVENOUS | Status: AC
  Administered 2019-02-06: 12:00:00 500 mg via INTRAVENOUS
  Filled 2019-02-06: qty 500

## 2019-02-06 SURGICAL SUPPLY — 2 items
FORCEPS HALSTEAD CVD 5IN STRL (INSTRUMENTS) ×3 IMPLANT
TRAY LACERAT/PLASTIC (MISCELLANEOUS) ×3 IMPLANT

## 2019-02-06 NOTE — Progress Notes (Signed)
Patient ID: Brett Small, male   DOB: [birth year], 43 y.o.   MRN: [id number]  [facility] PROGRESS NOTE  Brett Small EHO:[id number] DOB: Aug 05, 1976 DOA: 02/04/2019 PCP: Patient, [name]  HPI/Subjective: Patient was seen while on dialysis and he was still intubated.  Was able to follow some simple commands and shake his head yes or no to some questions.  Objective: Vitals:   02/06/19 1215 02/06/19 1242  BP: 110/87 (!) 140/91  Pulse: 93 (!) 101  Resp: 15 17  Temp:    SpO2: 99% 99%    Filed Weights   02/04/19 1600 02/04/19 1630 02/04/19 1941  Weight: 49 kg 47.8 kg 45.8 kg    ROS: Review of Systems  Unable to perform ROS: Intubated  Respiratory: Positive for shortness of breath.   Cardiovascular: Negative for chest pain.  Gastrointestinal: Negative for abdominal pain.   Exam: Physical Exam  HENT:  Nose: No mucosal edema.  Mouth/Throat: No oropharyngeal exudate or posterior oropharyngeal edema.  Eyes: Pupils are equal, round, and reactive to light. Conjunctivae, EOM and lids are normal.  Neck: No JVD present. Carotid bruit is not present. No edema present. No thyroid mass and no thyromegaly present.  Cardiovascular: S1 normal and S2 normal. Exam reveals no gallop.  No murmur heard. Pulses:      Dorsalis pedis pulses are 2+ on the right side and 2+ on the left side.  Respiratory: No respiratory distress. He has decreased breath sounds in the right lower field and the left lower field. He has no wheezes. He has no rhonchi. He has rales in the right lower field and the left lower field.  GI: Soft. Bowel sounds are normal. There is no abdominal tenderness.  Musculoskeletal:     Right ankle: He exhibits no swelling.     Left ankle: He exhibits no swelling.  Lymphadenopathy:    He has no cervical adenopathy.  Neurological: He is alert.  Able to squeeze hands to commands.  Skin: Skin is warm. No rash noted. Nails show no clubbing.  Psychiatric:  Follows commands.       Data Reviewed: Basic Metabolic Panel: Recent Labs  Lab 02/04/19 1237 02/04/19 1526 02/04/19 1619 02/05/19 0251 02/06/19 0514  NA 142  --   --  140 141  K 3.8  --   --  4.1 3.9  CL 106  --   --  97* 99  CO2 18*  --   --  23 24  GLUCOSE 158*  --   --  123* 92  BUN 73*  --   --  44* 73*  CREATININE 9.19* 9.41*  --  5.84* 7.76*  CALCIUM 8.3*  --   --  8.5* 7.7*  PHOS  --   --  6.7*  --   --    Liver Function Tests: Recent Labs  Lab 02/04/19 1237  AST 31  ALT 12  ALKPHOS 82  BILITOT 1.3*  PROT 7.5  ALBUMIN 3.4*   CBC: Recent Labs  Lab 02/04/19 1237 02/04/19 1526 02/05/19 0251 02/06/19 0514  WBC 14.4* 10.7* 6.2 6.1  NEUTROABS 11.8*  --   --   --   HGB 10.8* 9.4* 9.2* 8.2*  HCT 34.0* 30.2* 29.1* 25.8*  MCV 98.3 98.7 97.7 96.6  PLT 285 182 140* 166   Cardiac Enzymes: Recent Labs  Lab 02/04/19 1237  TROPONINI 0.07*   BNP (last 3 results) Recent Labs    12/24/18 2355  BNP 2,318.1*  CBG: Recent Labs  Lab 02/04/19 1606  GLUCAP 183*    Recent Results (from the past 240 hour(s))  Blood culture (routine x 2)     Status: None (Preliminary result)   Collection Time: 02/04/19 12:19 PM  Result Value Ref Range Status   Specimen Description BLOOD RIGHT ANTECUBITAL  Final   Special Requests   Final    BOTTLES DRAWN AEROBIC AND ANAEROBIC Blood Culture adequate volume   Culture  Setup Time   Final    GRAM POSITIVE COCCI IN BOTH AEROBIC AND ANAEROBIC BOTTLES CRITICAL RESULT CALLED TO, READ BACK BY AND VERIFIED WITH: [facility] DUNCAN AT 5701 02/05/2019 [place] Performed at [facility], [address], [place], [place] [address]    Culture GRAM POSITIVE COCCI  Final   Report Status PENDING  Incomplete  Blood Culture ID Panel (Reflexed)     Status: Abnormal   Collection Time: 02/04/19 12:19 PM  Result Value Ref Range Status   Enterococcus species NOT DETECTED NOT DETECTED Final   Listeria monocytogenes NOT DETECTED NOT DETECTED Final    Staphylococcus species DETECTED (A) NOT DETECTED Final    Comment: Methicillin (oxacillin) resistant coagulase negative staphylococcus. Possible blood culture contaminant (unless isolated from more than one blood culture draw or clinical case suggests pathogenicity). No antibiotic treatment is indicated for blood  culture contaminants. CRITICAL RESULT CALLED TO, READ BACK BY AND VERIFIED WITH:  [facility] DUNCAN AT 0300 02/05/2019 S[place]    Staphylococcus aureus (BCID) NOT DETECTED NOT DETECTED Final   Methicillin resistance DETECTED (A) NOT DETECTED Final    Comment: CRITICAL RESULT CALLED TO, READ BACK BY AND VERIFIED WITH:  ASAJAH DUNCAN AT 0715 02/05/2019 SDR    Streptococcus species NOT DETECTED NOT DETECTED Final   Streptococcus agalactiae NOT DETECTED NOT DETECTED Final   Streptococcus pneumoniae NOT DETECTED NOT DETECTED Final   Streptococcus pyogenes NOT DETECTED NOT DETECTED Final   Acinetobacter baumannii NOT DETECTED NOT DETECTED Final   Enterobacteriaceae species NOT DETECTED NOT DETECTED Final   Enterobacter cloacae complex NOT DETECTED NOT DETECTED Final   Escherichia coli NOT DETECTED NOT DETECTED Final   Klebsiella oxytoca NOT DETECTED NOT DETECTED Final   Klebsiella pneumoniae NOT DETECTED NOT DETECTED Final   Proteus species NOT DETECTED NOT DETECTED Final   Serratia marcescens NOT DETECTED NOT DETECTED Final   Haemophilus influenzae NOT DETECTED NOT DETECTED Final   Neisseria meningitidis NOT DETECTED NOT DETECTED Final   Pseudomonas aeruginosa NOT DETECTED NOT DETECTED Final   Candida albicans NOT DETECTED NOT DETECTED Final   Candida glabrata NOT DETECTED NOT DETECTED Final   Candida krusei NOT DETECTED NOT DETECTED Final   Candida parapsilosis NOT DETECTED NOT DETECTED Final   Candida tropicalis NOT DETECTED NOT DETECTED Final    Comment: Performed at [facility], [address] [place], [place] [address]  SARS Coronavirus 2 [facility] order, Performed in [place] hospital lab)     Status: None   Collection Time: 02/04/19 12:20 PM  Result Value Ref Range Status   SARS Coronavirus 2 NEGATIVE NEGATIVE Final    Comment: (NOTE) If result is NEGATIVE SARS-CoV-2 target nucleic acids are NOT DETECTED. The SARS-CoV-2 RNA is generally detectable in upper and lower  respiratory specimens during the acute phase of infection. The lowest  concentration of SARS-CoV-2 viral copies this assay can detect is 250  copies / mL. A negative result does not preclude SARS-CoV-2 infection  and should not be used as the sole basis for treatment  or other  patient management decisions.  A negative result may occur with  improper specimen collection / handling, submission of specimen other  than nasopharyngeal swab, presence of viral mutation(s) within the  areas targeted by this assay, and inadequate number of viral copies  (<250 copies / mL). A negative result must be combined with clinical  observations, patient history, and epidemiological information. If result is POSITIVE SARS-CoV-2 target nucleic acids are DETECTED. The SARS-CoV-2 RNA is generally detectable in upper and lower  respiratory specimens dur ing the acute phase of infection.  Positive  results are indicative of active infection with SARS-CoV-2.  Clinical  correlation with patient history and other diagnostic information is  necessary to determine patient infection status.  Positive results do  not rule out bacterial infection or co-infection with other viruses. If result is PRESUMPTIVE POSTIVE SARS-CoV-2 nucleic acids MAY BE PRESENT.   A presumptive positive result was obtained on the submitted specimen  and confirmed on repeat testing.  While 2019 novel coronavirus  (SARS-CoV-2) nucleic acids may be present in the submitted sample  additional confirmatory testing may be necessary for epidemiological  and / or clinical management purposes  to differentiate between  SARS-CoV-2 and other  Sarbecovirus currently known to infect humans.  If clinically indicated additional testing with an alternate test  methodology [phone]) is advised. The SARS-CoV-2 RNA is generally  detectable in upper and lower respiratory sp ecimens during the acute  phase of infection. The expected result is Negative. Fact Sheet for Patients:  StrictlyIdeas.no Fact Sheet for Healthcare Providers: BankingDealers.co.za This test is not yet approved or cleared by the [place] FDA and has been authorized for detection and/or diagnosis of SARS-CoV-2 by FDA under an Emergency Use Authorization (EUA).  This EUA will remain in effect (meaning this test can be used) for the duration of the COVID-19 declaration under Section 564(b)(1) of the Act, 21 U.S.C. section 360bbb-3(b)(1), unless the authorization is terminated or revoked sooner. Performed at [facility], [place] [address], [place], [place] [address]   Blood culture (routine x 2)     Status: None (Preliminary result)   Collection Time: 02/04/19 12:24 PM  Result Value Ref Range Status   Specimen Description BLOOD RIGHT ANTECUBITAL  Final   Special Requests   Final    BOTTLES DRAWN AEROBIC AND ANAEROBIC Blood Culture adequate volume   Culture  Setup Time   Final    GRAM POSITIVE COCCI AEROBIC BOTTLE ONLY CRITICAL VALUE NOTED.  VALUE IS CONSISTENT WITH PREVIOUSLY REPORTED AND CALLED VALUE. Performed at [facility], [address] [place], [place] [address]    Culture [facility] POSITIVE COCCI  Final   Report Status PENDING  Incomplete  MRSA PCR Screening     Status: None   Collection Time: 02/04/19  4:51 PM  Result Value Ref Range Status   MRSA by PCR NEGATIVE NEGATIVE Final    Comment:        The GeneXpert MRSA Assay (FDA approved for NASAL specimens only), is one component of a comprehensive MRSA colonization surveillance program. It is not intended to diagnose MRSA infection nor  to guide or monitor treatment for MRSA infections. Performed at [facility], [address] [place], [place] [address]   CULTURE, BLOOD (ROUTINE X 2) w Reflex to ID Panel     Status: None (Preliminary result)   Collection Time: 02/05/19  9:39 PM  Result Value Ref Range Status   Specimen Description BLOOD BLOOD RIGHT  FOREARM  Final   Special Requests   Final    BOTTLES DRAWN AEROBIC AND ANAEROBIC Blood Culture adequate volume   Culture   Final    NO GROWTH < 12 HOURS Performed at [facility], [address] [place], [place] [address]    Report Status PENDING  Incomplete  CULTURE, BLOOD (ROUTINE X 2) w Reflex to ID Panel     Status: None (Preliminary result)   Collection Time: 02/05/19  9:46 PM  Result Value Ref Range Status   Specimen Description BLOOD CATH SITE  Final   Special Requests   Final    BOTTLES DRAWN AEROBIC AND ANAEROBIC Blood Culture adequate volume   Culture   Final    NO GROWTH < 12 HOURS Performed at [facility], [address] [place], [place] [address]    Report Status PENDING  Incomplete     Studies: Dg Chest Port 1 View  Result Date: 02/06/2019 CLINICAL DATA:  Acute respiratory failure EXAM: PORTABLE CHEST 1 VIEW COMPARISON:  02/05/2019 FINDINGS: Cardiac shadow is stable. Endotracheal tube, gastric catheter and dialysis catheter are again noted and stable. Vascular stenting in the left axillary region is noted. The lungs are well aerated with persistent bibasilar infiltrates although significantly improved when compared with the prior exam. No bony abnormality is noted. IMPRESSION: Improved aeration in the bases bilaterally. Electronically Signed   By: Inez Catalina M.D.   On: 02/06/2019 03:27   Dg Chest Port 1 View  Result Date: 02/05/2019 CLINICAL DATA:  Acute respiratory failure. EXAM: PORTABLE CHEST 1 VIEW COMPARISON:  One-view chest x-ray 02/04/2019 FINDINGS: Heart is mildly enlarged. Progressive interstitial and airspace  disease is present with increasing consolidation at both lung bases. The endotracheal tube terminates 3 cm above the carina. NG tube terminates in the stomach. Right IJ dialysis catheter is stable. IMPRESSION: 1. Progressive interstitial and airspace disease with increased consolidation of both lung bases. 2. Support apparatus is stable. Electronically Signed   By: San Morelle M.D.   On: 02/05/2019 06:00   Dg Chest Portable 1 View  Result Date: 02/04/2019 CLINICAL DATA:  43 year old male with shortness of breath EXAM: PORTABLE CHEST 1 VIEW COMPARISON:  01/27/2019, 12/24/2010 FINDINGS: Cardiomediastinal silhouette unchanged. Similar appearance of mixed interstitial and airspace opacities throughout the lungs with mild thickening of the minor fissure. No large pleural effusion. No pneumothorax. Right IJ hemodialysis catheter is unchanged. Endotracheal tube terminates 5.8 cm above the carina. Gastric tube terminates within the stomach. Stent graft of the left axillary vein/brachial vein IMPRESSION: Mixed interstitial and airspace opacities with the differential primarily including multifocal pneumonia (including atypical pathogens), and edema. Endotracheal tube terminates suitably above the carina. Right IJ hemodialysis catheter terminates at the superior vena cava. Gastric tube terminates in the stomach. Electronically Signed   By: Corrie Mckusick D.O.   On: 02/04/2019 13:44    Scheduled Meds: . apixaban  2.5 mg Oral BID  . chlorhexidine gluconate (MEDLINE KIT)  15 mL Mouth Rinse BID  . Chlorhexidine Gluconate Cloth  6 each Topical Daily  . mouth rinse  15 mL Mouth Rinse 10 times per day  . pantoprazole (PROTONIX) IV  40 mg Intravenous Q24H  . sodium chloride flush  3 mL Intravenous Q12H  . vancomycin variable dose per unstable renal function (pharmacist dosing)   Does not apply See admin instructions   Continuous Infusions: . sodium chloride    . sodium chloride    . norepinephrine  (LEVOPHED) Adult infusion Stopped (  02/04/19 2350)  . propofol (DIPRIVAN) infusion 25 mcg/kg/min (02/06/19 0600)    Assessment/Plan:  1. Acute hypoxic respiratory failure.  Patient intubated in the ER.  Patient will be reevaluated by critical care specialist after dialysis and whether or not they can extubate today.  2. Acute pulmonary edema secondary to fluid overload.  Acute diastolic congestive heart failure.  Dialysis to remove fluid 3. Systemic inflammatory response.  Blood culture positive for gram-positive cocci.  Patient on IV vancomycin.  Repeat blood cultures so far negative.  Appreciate infectious disease consultation. 4. End-stage renal disease.  Dialysis again today 5. Anemia of chronic disease 6. History of hypertension.  Hypotensive on presentation.  Continue to monitor off medication  Code Status:     Code Status Orders  (From admission, onward)         Start     Ordered   02/04/19 1342  Full code  Continuous     02/04/19 1343        Code Status History    Date Active Date Inactive Code Status Order ID Comments User Context   01/27/2019 0654 01/29/2019 2049 Partial Code [id number]  Harrie Foreman, MD Inpatient   01/07/2019 2156 01/27/2019 0638 Partial Code [id number]  Tennis Ship, MD Inpatient   12/25/2018 0158 01/07/2019 2027 Partial Code [id number]  Ina Homes, MD ED     Family Communication: As per critical care specialist Disposition Plan: To be determined  Consultants:  Critical care specialist  Nephrology  Antibiotics:  Vancomycin  Time spent: 26 minutes.  Case discussed with critical care specialist.  [facility]

## 2019-02-06 NOTE — Progress Notes (Signed)
Hemodialysis treatment completed at 1242. Net UF removed 2L. Blood rinsed back, CVC saline locked. Red caps replaced. Dressing remains intact and insertion site remains without signs and symptoms of infection.  Post hemodialysis report given to S. Collie,  RN.    02/06/19 1242  Hand-Off documentation  Report given to (Full Name) Cecille Amsterdam, RN  Vital Signs  Pulse Rate (!) 101  Resp 17  BP (!) 140/91  BP Location Right Arm  BP Method Automatic  Patient Position (if appropriate) Lying  Oxygen Therapy  SpO2 99 %  O2 Device Ventilator  End Tidal CO2 (EtCO2) 30  During Hemodialysis Assessment  Intra-Hemodialysis Comments Tx completed  Post-Hemodialysis Assessment  Rinseback Volume (mL) 250 mL  KECN 51.5 V  Dialyzer Clearance Lightly streaked  Duration of HD Treatment -hour(s) 3 hour(s)  Hemodialysis Intake (mL) 500 mL  UF Total -Machine (mL) 2500 mL  Net UF (mL) 2000 mL  Tolerated HD Treatment Yes  Education / Care Plan  Dialysis Education Provided Yes  Documented Education in Care Plan Yes  Outpatient Plan of Care Reviewed and on Chart Yes  Hemodialysis Catheter Right Subclavian  No Placement Date or Time found.   Placed prior to admission: Yes  Orientation: Right  Access Location: Subclavian  Site Condition No complications  Blue Lumen Status Saline locked;Capped (Central line)  Red Lumen Status Saline locked;Capped (Central line)  Dressing Status Clean;Dry;Intact  Post treatment catheter status Capped and Clamped

## 2019-02-06 NOTE — Plan of Care (Signed)
  Problem: Clinical Measurements: Goal: Ability to maintain clinical measurements within normal limits will improve Outcome: Progressing Goal: Will remain free from infection Outcome: Progressing Goal: Diagnostic test results will improve Outcome: Progressing Goal: Respiratory complications will improve Outcome: Progressing Goal: Cardiovascular complication will be avoided Outcome: Progressing   Problem: Coping: Goal: Level of anxiety will decrease Outcome: Progressing   Problem: Elimination: Goal: Will not experience complications related to bowel motility Outcome: Progressing Goal: Will not experience complications related to urinary retention Outcome: Progressing   Problem: Pain Managment: Goal: General experience of comfort will improve Outcome: Progressing   Problem: Safety: Goal: Ability to remain free from injury will improve Outcome: Progressing   Problem: Skin Integrity: Goal: Risk for impaired skin integrity will decrease Outcome: Progressing   Problem: Education: Goal: Knowledge of General Education information will improve Description: Including pain rating scale, medication(s)/side effects and non-pharmacologic comfort measures Outcome: Not Progressing   Problem: Health Behavior/Discharge Planning: Goal: Ability to manage health-related needs will improve Outcome: Not Progressing   Problem: Activity: Goal: Risk for activity intolerance will decrease Outcome: Not Progressing   Problem: Nutrition: Goal: Adequate nutrition will be maintained Outcome: Not Progressing   

## 2019-02-06 NOTE — Progress Notes (Signed)
Pt was suctioned prior to extubation for a small amount of thick white secretions. Per Dr. Domingo Dimes order, he was extubated without incident and placed on a 2 L nasal cannula.

## 2019-02-06 NOTE — Op Note (Signed)
Operative Note     Preoperative diagnosis:   1. ESRD with functional permanent access and likely permcath infection  Postoperative diagnosis:  1. Same as above  Procedure:  Removal of right jugular Permcath  Surgeon:  Leotis Pain, MD  Anesthesia:  Local  Assistant: Hezzie Bump, PA-C  EBL:  Minimal  Indication for the Procedure:  The patient has a likely permcath infection and the permcath needs to be removed. The patient also has a functional permanent dialysis access and no longer needs their permcath.  This can be removed.  Risks and benefits are discussed and informed consent is obtained. An assistant was present during the procedure to help facilitate the exposure and expedite the procedure.  Description of the Procedure:  The patient's right neck, chest and existing catheter were sterilely prepped and draped. The assistant provided retraction and mobilization to help facilitate exposure and expedite the procedure throughout the entire procedure.  This included following suture, using retractors, and optimizing lighting. The area around the catheter was anesthetized copiously with 1% lidocaine. The catheter was dissected out with curved hemostats until the cuff was freed from the surrounding fibrous sheath. The fiber sheath was transected, and the catheter was then removed in its entirety using gentle traction. Pressure was held and sterile dressings were placed. Catheter tip was sent for culture. The patient tolerated the procedure well and was taken to the recovery room in stable condition.     Leotis Pain  02/06/2019, 1:16 PM This note was created with Dragon Medical transcription system. Any errors in dictation are purely unintentional.

## 2019-02-06 NOTE — Consult Note (Addendum)
Pharmacy Antibiotic Note  Brett Small is a 43 y.o. male admitted on 02/04/2019 withPneumonia.  Pharmacy has been consulted for Vancomycin dosing. Patient has left AV graft and PermCath. Patient receives dialysis and is currently intubated. ID will is following. Normal HD schedule TTHS patient is homeless. Plans to extubate patient today.    Plan:  Based on pt wt, will need vancomycin 500 mg IV after each HD session.   PermCath was pulled today. Will continue Vancomycin for potential line infection per ID. Patient went for HD today, and received Vancomycin 500 mg IV with dialysis. Plans to return to normal TTHS schedule on Saturday, 4/18.    Height: 5\' 2"  (157.5 cm) Weight: 100 lb 15.5 oz (45.8 kg) IBW/kg (Calculated) : 54.6  Temp (24hrs), Avg:97.6 F (36.4 C), Min:97.3 F (36.3 C), Max:98 F (36.7 C)  Recent Labs  Lab 02/04/19 1237 02/04/19 1526 02/05/19 0251 02/06/19 0514  WBC 14.4* 10.7* 6.2 6.1  CREATININE 9.19* 9.41* 5.84* 7.76*  LATICACIDVEN 4.6* 1.4  --   --     Estimated Creatinine Clearance: 7.8 mL/min (A) (by C-G formula based on SCr of 7.76 mg/dL (H)).    Allergies  Allergen Reactions  . Codeine Nausea Only    Patient questioned this (??)  . Sulfa Antibiotics Hives and Nausea And Vomiting    Antimicrobials this admission: 4/15 Cefepime >> 4/16 4/15 Vancomycin >>   Dose adjustments this admission: N/A  Microbiology results: 4/15 BCx: 3/4 Stap epi.  4/15 COVID: NEGATIVE 4/15 MRSA PCR: (-)   Thank you for allowing pharmacy to be a part of this patient's care.   Paticia Stack, PharmD Pharmacy Resident  02/06/2019 2:19 PM

## 2019-02-06 NOTE — Progress Notes (Signed)
Date of Admission:  02/04/2019     Brett Small is a 43 y.o. 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 He is intubated  pt has been getting dialysis thru a rt permactah since sept 2019. He had a graft placed on 12/26/18. He was in the behavioral until between 3/18- 01/26/19 for suicidal ideation but more so for social issues, homelessness and corona virus pandemic. He was then transferred to medical unit  4/7 with SOB and was noted to have pulmonary edema and he was dilaysed. He was discharged on 4/9 and came to the ED on 4/15 with increased SOB after missing dialysis X 2  Pulse ox was 60% and he was placed on NRB. He was intubated . His BP was high at 220/123. In the ER he received iv labetalol due to HTN, lasix, cefepime, and solumedrol.and was sent to ICU. HE underwent dialysis. I am seeing  him as Blood culture positive for coag neg staph.   Subjective: Extubated Doing well   Medications:   apixaban  2.5 mg Oral BID   chlorhexidine gluconate (MEDLINE KIT)  15 mL Mouth Rinse BID   Chlorhexidine Gluconate Cloth  6 each Topical Daily   mouth rinse  15 mL Mouth Rinse 10 times per day   pantoprazole (PROTONIX) IV  40 mg Intravenous Q24H   sodium chloride flush  3 mL Intravenous Q12H   vancomycin variable dose per unstable renal function (pharmacist dosing)   Does not apply See admin instructions    Objective: Vital signs in last 24 hours: Temp:  [97.3 F (36.3 C)-98 F (36.7 C)] 98 F (36.7 C) (04/17 0942) Pulse Rate:  [51-92] 89 (04/17 1145) Resp:  [12-29] 16 (04/17 1145) BP: (109-137)/(65-93) 129/91 (04/17 1145) SpO2:  [100 %] 100 % (04/17 1145) FiO2 (%):  [30 %-35 %] 30 % (04/17 0735)  PHYSICAL EXAM:  General: awake, a little confused no  resp distress Head: Normocephalic, without obvious abnormality, atraumatic. Eyes: Conjunctivae clear, anicteric sclerae. Pupils are equal ENT Nares normal. No drainage or sinus tenderness. Lungs:b/l air entry Heart:s1s2 Abdomen: Soft, Extremities: left AV graft Skin: No rashes or lesions. Or bruising Lymph: Cervical, supraclavicular normal. Neurologic: Grossly non-focal  Lab Results Recent Labs    02/05/19 0251 02/06/19 0514  WBC 6.2 6.1  HGB 9.2* 8.2*  HCT 29.1* 25.8*  NA 140 141  K 4.1 3.9  CL 97* 99  CO2 23 24  BUN 44* 73*  CREATININE 5.84* 7.76*   Liver Panel Recent Labs    02/04/19 1237  PROT 7.5  ALBUMIN 3.4*  AST 31  ALT 12  ALKPHOS 82  BILITOT 1.3*   Sedimentation Rate No results for input(s): ESRSEDRATE in the last 72 hours. C-Reactive Protein No results for input(s): CRP in the last 72 hours.  Microbiology:  Studies/Results:   Assessment/Plan: Impression/Recommendation ?Brett Small is a 43 y.o. 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 He is intubated ? ?Pulmonary edema due to High BP, missing dialysis- much better- extubated   Coag neg staph in  blood culture from the same site rt antecubital- not sure whether it is contaminant or true pathogen especially with permacath  repeat blood culture sent permacath has been  removed-  continue vanco   Malignant HTN leading to flash pulmonary edema  Could he have RAS?  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

## 2019-02-06 NOTE — Progress Notes (Signed)
Pre dialysis check.    02/06/19 0933  Hand-Off documentation  Report received from (Full Name) Cecille Amsterdam, RN  Vital Signs  Pulse Rate 65  Resp 15  Oxygen Therapy  SpO2 100 %  End Tidal CO2 (EtCO2) 36  Pre Treatment Patient Checks  Vascular access used during treatment Catheter  Hepatitis B Surface Antigen Results Negative  Date Hepatitis B Surface Antigen Drawn 01/24/19  Hepatitis B Surface Antibody  (less than 10-suceptible)  Date Hepatitis B Surface Antibody Drawn 12/25/18  Hemodialysis Consent Verified Yes  Hemodialysis Standing Orders Initiated Yes  ECG (Telemetry) Monitor On Yes  Prime Ordered Normal Saline  Length of  DialysisTreatment -hour(s) 3 Hour(s)  Dialyzer Elisio 17H NR  Dialysate 3K, 2.5 Ca  Dialysate Flow Ordered 600  Blood Flow Rate Ordered 300 mL/min  Ultrafiltration Goal 2000 Liters

## 2019-02-06 NOTE — Consult Note (Signed)
[facility] Vascular Consult Note  MRN : [id number]  Kashaun Bebo is a 43 y.o. (September 24, 1976) male who presents with chief complaint of respiratory distress.  History of Present Illness:  Due to the patient being critically ill and intubated the information for this consult was obtained from prior EPIC notation.  The patient is a 43 year old male with a past medical history of hypertension, depression, end-stage renal disease on hemodialysis who presented to the [facility] emergency department complaining of progressively worsening shortness of breath.  Patient has a right IJ PermCath and a left upper extremity dialysis access.  The patient had missed two dialysis appointments when he presented to the emergency department.  He was unable to speak in full sentences and oxygen saturation was 60%. Due to the patient being in respiratory distress he was intubated in the emergency department and transferred to the ICU for further care.   Patient was found to have positive blood cultures on February 04, 2019.  Nephrology / infectious disease would like the patient's PermCath removed as it may be a source of infection.  Vascular Surgery was consulted by NP Blakeney for Permcath removal  Current Facility-Administered Medications  Medication Dose Route Frequency Provider Last Rate Last Dose  . 0.9 %  sodium chloride infusion  250 mL Intravenous PRN Pyreddy, Pavan, MD      . 0.9 %  sodium chloride infusion  100 mL Intravenous PRN Lateef, Munsoor, MD      . acetaminophen (TYLENOL) tablet 650 mg  650 mg Per Tube Q6H PRN Wilhelmina Mcardle, MD       Or  . acetaminophen (TYLENOL) suppository 650 mg  650 mg Rectal Q6H PRN Wilhelmina Mcardle, MD      . alteplase (CATHFLO ACTIVASE) injection 2 mg  2 mg Intracatheter Once PRN Lateef, Munsoor, MD      . apixaban (ELIQUIS) tablet 2.5 mg  2.5 mg Oral BID Cyndee Brightly M, RPH   2.5 mg at 02/06/19 1100  . chlorhexidine  gluconate (MEDLINE KIT) (PERIDEX) 0.12 % solution 15 mL  15 mL Mouth Rinse BID Awilda Bill, NP   15 mL at 02/06/19 0900  . Chlorhexidine Gluconate Cloth 2 % PADS 6 each  6 each Topical Daily Darel Hong D, NP      . fentaNYL (SUBLIMAZE) injection 25-100 mcg  25-100 mcg Intravenous Q30 min PRN Wilhelmina Mcardle, MD      . heparin injection 1,000 Units  1,000 Units Dialysis PRN Anthonette Legato, MD   1,000 Units at 02/05/19 2148  . lidocaine (PF) (XYLOCAINE) 1 % injection 5 mL  5 mL Intradermal PRN Lateef, Munsoor, MD      . lidocaine-prilocaine (EMLA) cream 1 application  1 application Topical PRN Lateef, Munsoor, MD      . MEDLINE mouth rinse  15 mL Mouth Rinse 10 times per day Awilda Bill, NP   15 mL at 02/06/19 1100  . norepinephrine (LEVOPHED) 36m in 2547mpremix infusion  0-40 mcg/min Intravenous Titrated SiWilhelmina McardleMD   Stopped at 02/04/19 2350  . ondansetron (ZOFRAN) injection 4 mg  4 mg Intravenous Q6H PRN Pyreddy, Pavan, MD      . pantoprazole (PROTONIX) injection 40 mg  40 mg Intravenous Q24H SiWilhelmina McardleMD   40 mg at 02/05/19 1445  . pentafluoroprop-tetrafluoroeth (GEBAUERS) aerosol 1 application  1 application Topical PRN Lateef, Munsoor, MD      . propofol (DIPRIVAN) 1000 MG/100ML  infusion  0-50 mcg/kg/min Intravenous Continuous Awilda Bill, NP 10.5 mL/hr at 02/06/19 0600 25 mcg/kg/min at 02/06/19 0600  . sennosides (SENOKOT) 8.8 MG/5ML syrup 5 mL  5 mL Per Tube BID PRN Awilda Bill, NP      . sodium chloride flush (NS) 0.9 % injection 3 mL  3 mL Intravenous Q12H Pyreddy, Reatha Harps, MD   3 mL at 02/06/19 1143  . sodium chloride flush (NS) 0.9 % injection 3 mL  3 mL Intravenous PRN Pyreddy, Pavan, MD      . vancomycin (VANCOCIN) 500 mg in sodium chloride 0.9 % 100 mL IVPB  500 mg Intravenous To Hemo Paticia Stack, RPH 100 mL/hr at 02/06/19 1159 500 mg at 02/06/19 1159  . vancomycin variable dose per unstable renal function (pharmacist dosing)   Does not  apply See admin instructions [facility]       Past Medical History:  Diagnosis Date  . Hypertension   . Renal disorder    Past Surgical History:  Procedure Laterality Date  . A/V SHUNT INTERVENTION Left 01/19/2019   Procedure: LEFT UPPER EXTREMITY A/V SHUNTOGRAM / UPPER EXTREMITY ANGIOGRAM;  Surgeon: Algernon Huxley, MD;  Location: [facility];  Service: Cardiovascular;  Laterality: Left;  . AORTA - FEMORAL ARTERY BYPASS GRAFT    . AV FISTULA PLACEMENT Left 12/26/2018   Procedure: INSERTION OF GORE STRETCH VASCULAR 4-7MM X  45CM IN LEFT UPPER ARM;  Surgeon: Marty Heck, MD;  Location: [facility];  Service: Vascular;  Laterality: Left;   Social History Social History   Tobacco Use  . Smoking status: Former Smoker    Last attempt to quit: 06/26/2018    Years since quitting: 0.6  . Smokeless tobacco: Never Used  . Tobacco comment: smoked for 30 years   Substance Use Topics  . Alcohol use: Not Currently  . Drug use: Never   Family History Family History  Problem Relation Age of Onset  . Hypertension Other   . Diabetes Other   . Clotting disorder Father   Unable to obtain as patient is critically ill  Allergies  Allergen Reactions  . Codeine Nausea Only    Patient questioned this (??)  . Sulfa Antibiotics Hives and Nausea And Vomiting   REVIEW OF SYSTEMS (Negative unless checked)  Constitutional: '[]' Weight loss  '[]' Fever  '[]' Chills Cardiac: '[]' Chest pain   '[]' Chest pressure   '[]' Palpitations   '[x]' Shortness of breath when laying flat   '[x]' Shortness of breath at rest   '[x]' Shortness of breath with exertion. Vascular:  '[]' Pain in legs with walking   '[]' Pain in legs at rest   '[]' Pain in legs when laying flat   '[]' Claudication   '[]' Pain in feet when walking  '[]' Pain in feet at rest  '[]' Pain in feet when laying flat   '[]' History of DVT   '[]' Phlebitis   '[]' Swelling in legs   '[]' Varicose veins   '[]' Non-healing ulcers Pulmonary:   '[]' Uses home oxygen   '[]' Productive cough   '[]' Hemoptysis    '[]' Wheeze  '[]' COPD   '[]' Asthma Neurologic:  '[]' Dizziness  '[]' Blackouts   '[]' Seizures   '[]' History of stroke   '[]' History of TIA  '[]' Aphasia   '[]' Temporary blindness   '[]' Dysphagia   '[]' Weakness or numbness in arms   '[]' Weakness or numbness in legs Musculoskeletal:  '[]' Arthritis   '[]' Joint swelling   '[]' Joint pain   '[]' Low back pain Hematologic:  '[]' Easy bruising  '[]' Easy bleeding   '[]' Hypercoagulable state   '[]' Anemic  '[]' Hepatitis  Gastrointestinal:  '[]' Blood in stool   '[]' Vomiting blood  '[]' Gastroesophageal reflux/heartburn   '[]' Difficulty swallowing. Genitourinary:  '[x]' Chronic kidney disease   '[]' Difficult urination  '[]' Frequent urination  '[]' Burning with urination   '[]' Blood in urine Skin:  '[]' Rashes   '[]' Ulcers   '[]' Wounds Psychological:  '[]' History of anxiety   '[]'  History of major depression.  Physical Examination  Vitals:   02/06/19 1130 02/06/19 1145 02/06/19 1200 02/06/19 1215  BP: 125/88 (!) 129/91 119/85 110/87  Pulse: 78 89 89 93  Resp: '16 16 15 15  ' Temp:      TempSrc:      SpO2: 100% 100% 99% 99%  Weight:      Height:       Body mass index is 18.47 kg/m. Gen:  Sedated and intubated, NAD Head: Thermal/AT, No temporalis wasting. Prominent temp pulse not noted. Ear/Nose/Throat: Hearing grossly intact, nares w/o erythema or drainage, oropharynx w/o Erythema/Exudate Eyes: Sclera non-icteric, conjunctiva clear Neck: Trachea midline.  No JVD.  Pulmonary:  Good air movement, respirations not labored, equal bilaterally.  Cardiac: RRR, normal S1, S2. Vascular:  Vessel Right Left  Radial Palpable Palpable  Ulnar Palpable Palpable  Brachial Palpable Palpable  Carotid Palpable, without bruit Palpable, without bruit  Aorta Not palpable N/A  Femoral Palpable Palpable  Popliteal Palpable Palpable  PT Palpable Palpable  DP Palpable Palpable   Right IJ Permcath Left Upper Extremity: Brach Ax graft with good bruit and thrill  Gastrointestinal: soft, non-tender/non-distended. No guarding/reflex.  Musculoskeletal:  M/S 5/5 throughout.  Extremities without ischemic changes.  No deformity or atrophy. Mild edema. Neurologic: Sensation grossly intact in extremities.  Symmetrical.  Speech is fluent. Motor exam as listed above. Psychiatric: Judgment intact, Mood & affect appropriate for pt's clinical situation. Dermatologic: No rashes or ulcers noted.  No cellulitis or open wounds. Lymph : No Cervical, Axillary, or Inguinal lymphadenopathy.  CBC Lab Results  Component Value Date   WBC 6.1 02/06/2019   HGB 8.2 (L) 02/06/2019   HCT 25.8 (L) 02/06/2019   MCV 96.6 02/06/2019   PLT 166 02/06/2019   BMET    Component Value Date/Time   NA 141 02/06/2019 0514   K 3.9 02/06/2019 0514   CL 99 02/06/2019 0514   CO2 24 02/06/2019 0514   GLUCOSE 92 02/06/2019 0514   BUN 73 (H) 02/06/2019 0514   CREATININE 7.76 (H) 02/06/2019 0514   CALCIUM 7.7 (L) 02/06/2019 0514   GFRNONAA 8 (L) 02/06/2019 0514   GFRAA 9 (L) 02/06/2019 0514   Estimated Creatinine Clearance: 7.8 mL/min (A) (by C-G formula based on SCr of 7.76 mg/dL (H)).  COAG Lab Results  Component Value Date   INR 1.1 12/26/2018   Radiology Dg Chest Port 1 View  Result Date: 02/06/2019 CLINICAL DATA:  Acute respiratory failure EXAM: PORTABLE CHEST 1 VIEW COMPARISON:  02/05/2019 FINDINGS: Cardiac shadow is stable. Endotracheal tube, gastric catheter and dialysis catheter are again noted and stable. Vascular stenting in the left axillary region is noted. The lungs are well aerated with persistent bibasilar infiltrates although significantly improved when compared with the prior exam. No bony abnormality is noted. IMPRESSION: Improved aeration in the bases bilaterally. Electronically Signed   By: Inez Catalina M.D.   On: 02/06/2019 03:27   Dg Chest Port 1 View  Result Date: 02/05/2019 CLINICAL DATA:  Acute respiratory failure. EXAM: PORTABLE CHEST 1 VIEW COMPARISON:  One-view chest x-ray 02/04/2019 FINDINGS: Heart is mildly enlarged. Progressive  interstitial and airspace disease is present with increasing consolidation  at both lung bases. The endotracheal tube terminates 3 cm above the carina. NG tube terminates in the stomach. Right IJ dialysis catheter is stable. IMPRESSION: 1. Progressive interstitial and airspace disease with increased consolidation of both lung bases. 2. Support apparatus is stable. Electronically Signed   By: San Morelle M.D.   On: 02/05/2019 06:00   Dg Chest Portable 1 View  Result Date: 02/04/2019 CLINICAL DATA:  43 year old male with shortness of breath EXAM: PORTABLE CHEST 1 VIEW COMPARISON:  01/27/2019, 12/24/2010 FINDINGS: Cardiomediastinal silhouette unchanged. Similar appearance of mixed interstitial and airspace opacities throughout the lungs with mild thickening of the minor fissure. No large pleural effusion. No pneumothorax. Right IJ hemodialysis catheter is unchanged. Endotracheal tube terminates 5.8 cm above the carina. Gastric tube terminates within the stomach. Stent graft of the left axillary vein/brachial vein IMPRESSION: Mixed interstitial and airspace opacities with the differential primarily including multifocal pneumonia (including atypical pathogens), and edema. Endotracheal tube terminates suitably above the carina. Right IJ hemodialysis catheter terminates at the superior vena cava. Gastric tube terminates in the stomach. Electronically Signed   By: Corrie Mckusick D.O.   On: 02/04/2019 13:44   Dg Chest Port 1 View  Result Date: 01/27/2019 CLINICAL DATA:  Cough.  Hypertension EXAM: PORTABLE CHEST 1 VIEW COMPARISON:  12/24/2018 FINDINGS: Symmetric perihilar interstitial and airspace opacity. No effusion or pneumothorax. Borderline heart size. Dialysis catheter on the right with tip at the SVC. Venous stenting at the left axilla. IMPRESSION: Symmetric perihilar airspace disease favoring edema. Electronically Signed   By: Monte Fantasia M.D.   On: 01/27/2019 07:17   Assessment/Plan The patient  is a 43 year old male with a past medical history of hypertension, depression, end-stage renal disease on hemodialysis who presented to the [facility] emergency department complaining of progressively worsening shortness of breath.  Patient has a right IJ PermCath and a left upper extremity dialysis access. Now with positive blood cultures 1. ESRD: Patient has a right IJ PermCath which he was dialyzing through.  Recent positive blood cultures.  We will remove the right IJ PermCath as this could be a source of infection.  Patient has a left upper extremity brachial axillary graft with a good bruit and thrill.  We will possibly consider cannulating this for dialysis? 2.  Respiratory failure: Currently sedated and on a vent.  Care as per critical team. 3.  Hypertension: On appropriate medications.  Discussed with Dr. Mayme Genta, PA-C  02/06/2019 12:44 PM  This note was created with Dragon medical transcription system.  Any error is purely unintentional

## 2019-02-06 NOTE — Progress Notes (Addendum)
Pre hemodialysis report received from Bobbie Stack, Therapist, sports. Received patient in ICU bed 10.  Intubated, obtunded.  No acute distress noted. CVC without signs and symptoms of infection. Dressing in place and uncompromised. Accessed CVC per policy and found patent on each side. Hemodialysis treatment initiated at 0942 via CVC. Plan for 3 hours treatment with UF of 2L as tolerated.

## 2019-02-06 NOTE — Progress Notes (Signed)
[facility]  ROUNDING NOTE   Subjective:  Patient remains critically ill. He is still on the ventilator. Due for dialysis today. PermCath to be removed after dialysis today.   Objective:  Vital signs in last 24 hours:  Temp:  [97.3 F (36.3 C)-98 F (36.7 C)] 98 F (36.7 C) (04/17 0942) Pulse Rate:  [51-68] 68 (04/17 1015) Resp:  [12-29] 16 (04/17 1015) BP: (109-129)/(65-80) 129/80 (04/17 1015) SpO2:  [100 %] 100 % (04/17 1015) FiO2 (%):  [30 %-35 %] 30 % (04/17 0735)  Weight change:  Filed Weights   02/04/19 1600 02/04/19 1630 02/04/19 1941  Weight: 49 kg 47.8 kg 45.8 kg    Intake/Output: I/O last 3 completed shifts: In: 729.1 [I.V.:629.1; IV Piggyback:100] Out: 2315 [Urine:119; Emesis/NG output:50; Other:2146]   Intake/Output this shift:  Total I/O In: 21.1 [I.V.:21.1] Out: -   Physical Exam: General: Laying in bed, criticlaly ill appeairng  Head: Normocephalic, atraumatic. ETT in place  Eyes: Anicteric  Neck: Supple  Lungs:  Scattered rhonchi bilateral, normal effort, vent assisted  Heart: Regular, no rubs  Abdomen:  Soft, nontender, bowel sounds present  Extremities: no peripheral edema.  Neurologic: Awake, alert, able to nod yes/no  Skin: No lesions      Access: IJ PermCath, left arm AVG +thrill and +bruit    Basic Metabolic Panel: Recent Labs  Lab 02/04/19 1237 02/04/19 1526 02/04/19 1619 02/05/19 0251 02/06/19 0514  NA 142  --   --  140 141  K 3.8  --   --  4.1 3.9  CL 106  --   --  97* 99  CO2 18*  --   --  23 24  GLUCOSE 158*  --   --  123* 92  BUN 73*  --   --  44* 73*  CREATININE 9.19* 9.41*  --  5.84* 7.76*  CALCIUM 8.3*  --   --  8.5* 7.7*  PHOS  --   --  6.7*  --   --     Liver Function Tests: Recent Labs  Lab 02/04/19 1237  AST 31  ALT 12  ALKPHOS 82  BILITOT 1.3*  PROT 7.5  ALBUMIN 3.4*   No results for input(s): LIPASE, AMYLASE in the last 168 hours. No results for input(s): AMMONIA in the last 168  hours.  CBC: Recent Labs  Lab 02/04/19 1237 02/04/19 1526 02/05/19 0251 02/06/19 0514  WBC 14.4* 10.7* 6.2 6.1  NEUTROABS 11.8*  --   --   --   HGB 10.8* 9.4* 9.2* 8.2*  HCT 34.0* 30.2* 29.1* 25.8*  MCV 98.3 98.7 97.7 96.6  PLT 285 182 140* 166    Cardiac Enzymes: Recent Labs  Lab 02/04/19 1237  TROPONINI 0.07*    BNP: Invalid input(s): POCBNP  CBG: Recent Labs  Lab 02/04/19 1606  GLUCAP 183*    Microbiology: Results for orders placed or performed during the hospital encounter of 02/04/19  Blood culture (routine x 2)     Status: None (Preliminary result)   Collection Time: 02/04/19 12:19 PM  Result Value Ref Range Status   Specimen Description BLOOD RIGHT ANTECUBITAL  Final   Special Requests   Final    BOTTLES DRAWN AEROBIC AND ANAEROBIC Blood Culture adequate volume   Culture  Setup Time   Final    GRAM POSITIVE COCCI IN BOTH AEROBIC AND ANAEROBIC BOTTLES CRITICAL RESULT CALLED TO, READ BACK BY AND VERIFIED WITH: [facility] DUNCAN AT 2878 02/05/2019 [place] Performed at [facility], [address] [place], [place] [address]    Culture GRAM POSITIVE COCCI  Final   Report Status PENDING  Incomplete  Blood Culture ID Panel (Reflexed)     Status: Abnormal   Collection Time: 02/04/19 12:19 PM  Result Value Ref Range Status   Enterococcus species NOT DETECTED NOT DETECTED Final   Listeria monocytogenes NOT DETECTED NOT DETECTED Final   Staphylococcus species DETECTED (A) NOT DETECTED Final    Comment: Methicillin (oxacillin) resistant coagulase negative staphylococcus. Possible blood culture contaminant (unless isolated from more than one blood culture draw or clinical case suggests pathogenicity). No antibiotic treatment is indicated for blood  culture contaminants. CRITICAL RESULT CALLED TO, READ BACK BY AND VERIFIED WITH:  [facility] DUNCAN AT 7048 02/05/2019 S[facility]    Staphylococcus aureus (BCID) NOT DETECTED NOT DETECTED Final   Methicillin resistance  DETECTED (A) NOT DETECTED Final    Comment: CRITICAL RESULT CALLED TO, READ BACK BY AND VERIFIED WITH:  ASAJAH DUNCAN AT 0715 02/05/2019 SDR    Streptococcus species NOT DETECTED NOT DETECTED Final   Streptococcus agalactiae NOT DETECTED NOT DETECTED Final   Streptococcus pneumoniae NOT DETECTED NOT DETECTED Final   Streptococcus pyogenes NOT DETECTED NOT DETECTED Final   Acinetobacter baumannii NOT DETECTED NOT DETECTED Final   Enterobacteriaceae species NOT DETECTED NOT DETECTED Final   Enterobacter cloacae complex NOT DETECTED NOT DETECTED Final   Escherichia coli NOT DETECTED NOT DETECTED Final   Klebsiella oxytoca NOT DETECTED NOT DETECTED Final   Klebsiella pneumoniae NOT DETECTED NOT DETECTED Final   Proteus species NOT DETECTED NOT DETECTED Final   Serratia marcescens NOT DETECTED NOT DETECTED Final   Haemophilus influenzae NOT DETECTED NOT DETECTED Final   Neisseria meningitidis NOT DETECTED NOT DETECTED Final   Pseudomonas aeruginosa NOT DETECTED NOT DETECTED Final   Candida albicans NOT DETECTED NOT DETECTED Final   Candida glabrata NOT DETECTED NOT DETECTED Final   Candida krusei NOT DETECTED NOT DETECTED Final   Candida parapsilosis NOT DETECTED NOT DETECTED Final   Candida tropicalis NOT DETECTED NOT DETECTED Final    Comment: Performed at [facility], [address] [place], [place] [address]  SARS Coronavirus 2 [facility] order, Performed in [place] hospital lab)     Status: None   Collection Time: 02/04/19 12:20 PM  Result Value Ref Range Status   SARS Coronavirus 2 NEGATIVE NEGATIVE Final    Comment: (NOTE) If result is NEGATIVE SARS-CoV-2 target nucleic acids are NOT DETECTED. The SARS-CoV-2 RNA is generally detectable in upper and lower  respiratory specimens during the acute phase of infection. The lowest  concentration of SARS-CoV-2 viral copies this assay can detect is 250  copies / mL. A negative result does not preclude SARS-CoV-2 infection   and should not be used as the sole basis for treatment or other  patient management decisions.  A negative result may occur with  improper specimen collection / handling, submission of specimen other  than nasopharyngeal swab, presence of viral mutation(s) within the  areas targeted by this assay, and inadequate number of viral copies  (<250 copies / mL). A negative result must be combined with clinical  observations, patient history, and epidemiological information. If result is POSITIVE SARS-CoV-2 target nucleic acids are DETECTED. The SARS-CoV-2 RNA is generally detectable in upper and lower  respiratory specimens dur ing the acute phase of infection.  Positive  results are indicative of active infection with SARS-CoV-2.  Clinical  correlation with patient history and other  diagnostic information is  necessary to determine patient infection status.  Positive results do  not rule out bacterial infection or co-infection with other viruses. If result is PRESUMPTIVE POSTIVE SARS-CoV-2 nucleic acids MAY BE PRESENT.   A presumptive positive result was obtained on the submitted specimen  and confirmed on repeat testing.  While 2019 novel coronavirus  (SARS-CoV-2) nucleic acids may be present in the submitted sample  additional confirmatory testing may be necessary for epidemiological  and / or clinical management purposes  to differentiate between  SARS-CoV-2 and other Sarbecovirus currently known to infect humans.  If clinically indicated additional testing with an alternate test  methodology [phone]) is advised. The SARS-CoV-2 RNA is generally  detectable in upper and lower respiratory sp ecimens during the acute  phase of infection. The expected result is Negative. Fact Sheet for Patients:  StrictlyIdeas.no Fact Sheet for Healthcare Providers: BankingDealers.co.za This test is not yet approved or cleared by the [place] FDA  and has been authorized for detection and/or diagnosis of SARS-CoV-2 by FDA under an Emergency Use Authorization (EUA).  This EUA will remain in effect (meaning this test can be used) for the duration of the COVID-19 declaration under Section 564(b)(1) of the Act, 21 U.S.C. section 360bbb-3(b)(1), unless the authorization is terminated or revoked sooner. Performed at [facility], [place] [address], [place], [place] [address]   Blood culture (routine x 2)     Status: None (Preliminary result)   Collection Time: 02/04/19 12:24 PM  Result Value Ref Range Status   Specimen Description BLOOD RIGHT ANTECUBITAL  Final   Special Requests   Final    BOTTLES DRAWN AEROBIC AND ANAEROBIC Blood Culture adequate volume   Culture  Setup Time   Final    GRAM POSITIVE COCCI AEROBIC BOTTLE ONLY CRITICAL VALUE NOTED.  VALUE IS CONSISTENT WITH PREVIOUSLY REPORTED AND CALLED VALUE. Performed at [facility], [address] [place], [place] [address]    Culture [employer] POSITIVE COCCI  Final   Report Status PENDING  Incomplete  MRSA PCR Screening     Status: None   Collection Time: 02/04/19  4:51 PM  Result Value Ref Range Status   MRSA by PCR NEGATIVE NEGATIVE Final    Comment:        The GeneXpert MRSA Assay (FDA approved for NASAL specimens only), is one component of a comprehensive MRSA colonization surveillance program. It is not intended to diagnose MRSA infection nor to guide or monitor treatment for MRSA infections. Performed at [facility], [address] [place], [place] [address]   CULTURE, BLOOD (ROUTINE X 2) w Reflex to ID Panel     Status: None (Preliminary result)   Collection Time: 02/05/19  9:39 PM  Result Value Ref Range Status   Specimen Description BLOOD BLOOD RIGHT FOREARM  Final   Special Requests   Final    BOTTLES DRAWN AEROBIC AND ANAEROBIC Blood Culture adequate volume   Culture   Final    NO GROWTH < 12 HOURS Performed at [facility], [address], [place], [place] [address]    Report Status PENDING  Incomplete  CULTURE, BLOOD (ROUTINE X 2) w Reflex to ID Panel     Status: None (Preliminary result)   Collection Time: 02/05/19  9:46 PM  Result Value Ref Range Status   Specimen Description BLOOD CATH SITE  Final   Special Requests   Final    BOTTLES DRAWN AEROBIC AND ANAEROBIC Blood Culture  adequate volume   Culture   Final    NO GROWTH < 12 HOURS Performed at [facility], [address] [place], [place] [address]    Report Status PENDING  Incomplete    Coagulation Studies: No results for input(s): LABPROT, INR in the last 72 hours.  Urinalysis: No results for input(s): COLORURINE, LABSPEC, PHURINE, GLUCOSEU, HGBUR, BILIRUBINUR, KETONESUR, PROTEINUR, UROBILINOGEN, NITRITE, LEUKOCYTESUR in the last 72 hours.  Invalid input(s): APPERANCEUR    Imaging: Dg Chest Port 1 View  Result Date: 02/06/2019 CLINICAL DATA:  Acute respiratory failure EXAM: PORTABLE CHEST 1 VIEW COMPARISON:  02/05/2019 FINDINGS: Cardiac shadow is stable. Endotracheal tube, gastric catheter and dialysis catheter are again noted and stable. Vascular stenting in the left axillary region is noted. The lungs are well aerated with persistent bibasilar infiltrates although significantly improved when compared with the prior exam. No bony abnormality is noted. IMPRESSION: Improved aeration in the bases bilaterally. Electronically Signed   By: Inez Catalina M.D.   On: 02/06/2019 03:27   Dg Chest Port 1 View  Result Date: 02/05/2019 CLINICAL DATA:  Acute respiratory failure. EXAM: PORTABLE CHEST 1 VIEW COMPARISON:  One-view chest x-ray 02/04/2019 FINDINGS: Heart is mildly enlarged. Progressive interstitial and airspace disease is present with increasing consolidation at both lung bases. The endotracheal tube terminates 3 cm above the carina. NG tube terminates in the stomach. Right IJ dialysis catheter is stable. IMPRESSION: 1. Progressive  interstitial and airspace disease with increased consolidation of both lung bases. 2. Support apparatus is stable. Electronically Signed   By: San Morelle M.D.   On: 02/05/2019 06:00   Dg Chest Portable 1 View  Result Date: 02/04/2019 CLINICAL DATA:  43 year old male with shortness of breath EXAM: PORTABLE CHEST 1 VIEW COMPARISON:  01/27/2019, 12/24/2010 FINDINGS: Cardiomediastinal silhouette unchanged. Similar appearance of mixed interstitial and airspace opacities throughout the lungs with mild thickening of the minor fissure. No large pleural effusion. No pneumothorax. Right IJ hemodialysis catheter is unchanged. Endotracheal tube terminates 5.8 cm above the carina. Gastric tube terminates within the stomach. Stent graft of the left axillary vein/brachial vein IMPRESSION: Mixed interstitial and airspace opacities with the differential primarily including multifocal pneumonia (including atypical pathogens), and edema. Endotracheal tube terminates suitably above the carina. Right IJ hemodialysis catheter terminates at the superior vena cava. Gastric tube terminates in the stomach. Electronically Signed   By: Corrie Mckusick D.O.   On: 02/04/2019 13:44     Medications:   . sodium chloride    . sodium chloride    . norepinephrine (LEVOPHED) Adult infusion Stopped (02/04/19 2350)  . propofol (DIPRIVAN) infusion 25 mcg/kg/min (02/06/19 0600)  . vancomycin     . apixaban  2.5 mg Oral BID  . chlorhexidine gluconate (MEDLINE KIT)  15 mL Mouth Rinse BID  . Chlorhexidine Gluconate Cloth  6 each Topical Daily  . mouth rinse  15 mL Mouth Rinse 10 times per day  . pantoprazole (PROTONIX) IV  40 mg Intravenous Q24H  . sodium chloride flush  3 mL Intravenous Q12H  . vancomycin variable dose per unstable renal function (pharmacist dosing)   Does not apply See admin instructions   sodium chloride, sodium chloride, acetaminophen **OR** acetaminophen, alteplase, fentaNYL (SUBLIMAZE) injection, heparin,  lidocaine (PF), lidocaine-prilocaine, [DISCONTINUED] ondansetron **OR** ondansetron (ZOFRAN) IV, pentafluoroprop-tetrafluoroeth, sennosides, sodium chloride flush  Assessment/ Plan:  [name] is a 43 y.o. white male with end stage renal disease on hemodialysis, hypertension, depression, aortobifemoral bypass, admission for depression 3/20, now admitted  with acute respiratory failure after missed dialysis.  Patient is homeless  [place]. TTS schedule.   1.  ESRD: Patient due for dialysis today.  Orders have been prepared.  Once we complete dialysis today vascular surgery to remove PermCath.  2.  Acute respiratory failure.  Continue mechanical ventilation at this point in time.  3.  Positive blood culture.  Coagulase negative Staphylococcus noted.  PermCath to be removed after dialysis treatment today.  4.  Anemia of chronic kidney disease.  Hemoglobin slightly down to 8.2.  Once he is reestablished regular dialysis we will consider starting Epogen.  5.  Secondary hyperparathyroidism.  Awaiting repeat phosphorus today.   LOS: 2 Janney Priego 4/17/202010:29 AM

## 2019-02-06 NOTE — TOC Initial Note (Signed)
Transition of Care [facility]) - Initial/Assessment Note    Patient Details  Name: Brett Small MRN: [id number] Date of Birth: 1976/02/25  [facility] CM/SW Contact:    Latanya Maudlin, RN Phone Number: 02/06/2019, 11:40 AM  Clinical Narrative:  TOC team attempted to complete full assessment but patient is sedated. Father [name] is next listed contact. Voicemail left with patients father. Per previous documentation patient was recently admitted and discharged from this facility. He and his cousin travelled here from [place] and have not been able to find a place to live. Per previous notes the patient has not seen his cousin since March. Patient has stayed in different hotels and recently was discharged to [facility]. TOC team was able to secure a dialysis spot for him at [facility] in [place]. Estill Bamberg, dialysis coordinator is aware of admission. Patient was given transportation resources last admission. TOC will follow as patient becomes more alert and we can begin coordinating disposition.                 Expected Discharge Plan: Home/Self Care(poss homeless shelter?) Barriers to Discharge: Continued Medical Work up   Patient Goals and CMS Choice        Expected Discharge Plan and Services Expected Discharge Plan: Home/Self Care(poss homeless shelter?)                                  Prior Living Arrangements/Services   Lives with:: Self                   Activities of Daily Living      Permission Sought/Granted                  Emotional Assessment              Admission diagnosis:  Acute pulmonary edema ([place]) [J81.0] Demand ischemia ([place]) [I24.8] Acute respiratory failure with hypoxia ([place]) [J96.01] Patient Active Problem List   Diagnosis Date Noted  . Acute respiratory failure ([place]) 02/04/2019  . Acute respiratory failure with hypoxemia ([place]) 01/27/2019  . Depression 01/07/2019  . MDD (major depressive disorder), single episode,  severe , no psychosis ([place])   . Homelessness   . Acute respiratory failure with hypoxia ([place]) 12/25/2018  . End stage renal disease ([place]) 12/25/2018  . Hypertension 12/25/2018  . Renal osteodystrophy 12/25/2018   PCP:  Patient, [name] Pharmacy:   [facility], [place] [address] [place] [address] Phone: [phone] Fax: [phone]     Social Determinants of Health ([place]) Interventions    Readmission Risk Interventions No flowsheet data found.

## 2019-02-06 NOTE — Progress Notes (Signed)
Remains intubated, Tolerating SBT Comfortable, undergoing dialysis Lightly sedated, interactive  Vitals:   02/06/19 1500 02/06/19 1600 02/06/19 1700 02/06/19 1855  BP: (!) 157/97 (!) 160/90 (!) 150/87 (!) 172/87  Pulse: 83 81 76 82  Resp: '15 11 11 17  ' Temp:    (!) 97.5 F (36.4 C)  TempSrc:    Oral  SpO2: 100% 100% 100% 99%  Weight:      Height:       Vent Mode: PSV FiO2 (%):  [30 %-35 %] 30 % Pressure Support:  [10 cmH20-15 cmH20] 10 cmH20  Chronically ill-appearing HEENT: NCAT, sclerae anicteric Neck supple.  No JVD, trachea midline Lungs: clear anteriorly COR: RRR, no M, R,or G Abdomen: No distention, soft, nontender.  Normoactive bowel sounds Extremities: warm, no edema, LUE AVG present with palpable thrill Neuro: No focal neurologic deficits noted  BMP Latest Ref Rng & Units 02/06/2019 02/05/2019 02/04/2019  Glucose 70 - 99 mg/dL 92 123(H) -  BUN 6 - 20 mg/dL 73(H) 44(H) -  Creatinine 0.61 - 1.24 mg/dL 7.76(H) 5.84(H) 9.41(H)  Sodium 135 - 145 mmol/L 141 140 -  Potassium 3.5 - 5.1 mmol/L 3.9 4.1 -  Chloride 98 - 111 mmol/L 99 97(L) -  CO2 22 - 32 mmol/L 24 23 -  Calcium 8.9 - 10.3 mg/dL 7.7(L) 8.5(L) -    CBC Latest Ref Rng & Units 02/06/2019 02/05/2019 02/04/2019  WBC 4.0 - 10.5 K/uL 6.1 6.2 10.7(H)  Hemoglobin 13.0 - 17.0 g/dL 8.2(L) 9.2(L) 9.4(L)  Hematocrit 39.0 - 52.0 % 25.8(L) 29.1(L) 30.2(L)  Platelets 150 - 400 K/uL 166 140(L) 182   Hepatic Function Latest Ref Rng & Units 02/04/2019 01/27/2019 01/24/2019  Total Protein 6.5 - 8.1 g/dL 7.5 6.9 -  Albumin 3.5 - 5.0 g/dL 3.4(L) 3.4(L) 3.0(L)  AST 15 - 41 U/L 31 17 -  ALT 0 - 44 U/L 12 10 -  Alk Phosphatase 38 - 126 U/L 82 71 -  Total Bilirubin 0.3 - 1.2 mg/dL 1.3(H) 0.7 -   CXR: Persistent edema pattern with partial clearing on L.  LLL atelectasis    INDWELLING DEVICES: R tunneled HD cath (chronic) - removed 04/17 ETT 04/15 >> anticipate d/c today.  MICRO DATA: [place] 04/15 >> NEG MRSA PCR 04/15 >>  NEG Blood 2/2 >> coag neg staph  ANTIMICROBIALS:  Cefepime 04/15 >> 04/16 Vanc 04/15 >>     IMPRESSION: Acute hypoxemic respiratory failure Pulmonary edema Left lower lobe atelectasis Doubt pneumonia ESRD Coag neg staph bacteremia Protein-calorie malnutrition Anemia related to CKD - no overt blood loss ICU/vent associated discomfort  PLAN/REC: PSV weaning plan to extubate today after dialysis. Tolerating SBT HD per nephrology - next planned, and performed today  Hold tube feeds for planned extubation SUP: IV pantoprazole (will be DC'd after extubation) Monitor temp, WBC count Micro and abx as above ID consultation 04/16 PermCath removed today DVT px: SCDs. Resumed apixaban 04/16 Monitor CBC intermittently Transfuse per usual guidelines  Intermittent fentanyl PRN Discontinue propofol infusion for planned extubation  NOTE: pt is chronically on apixaban. Appears to have been initiated @ [facility]. Absolute indication is : severe PVD previously requiring thromboembolectomy.  This had been determined on prior admission.  Multidisciplinary rounds were performed with ICU team.  Total critical care time 35 minutes.  Renold Don, MD [place] [facility]   02/06/2019 8:08 PM

## 2019-02-06 NOTE — Progress Notes (Signed)
Pre hemodialysis report received from Bobbie Stack, Therapist, sports. Received patient in ICU bed 10.  Intubated, obtunded.  No acute distress noted. CVC without signs and symptoms of infection. Dressing in place and uncompromised. Accessed CVC per policy and found patent on each side. Hemodialysis treatment initiated at 0942 via CVC. Plan for 3 hours treatment with UF of 2L as tolerated.    02/06/19 0942  Hand-Off documentation  Report received from (Full Name) Cecille Amsterdam, RN  Vital Signs  Temp 98 F (36.7 C)  Temp Source Axillary  Pulse Rate 67  Resp (!) 22  BP 129/75  BP Location Right Arm  BP Method Automatic  Patient Position (if appropriate) Sitting  Oxygen Therapy  SpO2 100 %  O2 Device Ventilator  End [place] CO2 (EtCO2) 33  Pain Assessment  Pain Scale 0-10  Pain Score 0  Faces Pain Scale 0  Time-Out for Hemodialysis  What Procedure? dialysis  Pt Identifiers(min of two) First/Last Name;MRN/Account#  Correct Site? Yes  Correct Side? Yes  Correct Procedure? Yes  Consents Verified? Yes  Rad Studies Available? N/A  Safety Precautions Reviewed? Yes  Engineer, civil (consulting) Number [id number]  Station Number  ([id number])  UF/Alarm Test Passed  Conductivity: Meter 14  Conductivity: Machine  14.1  pH 7.4  Reverse Osmosis 3  Normal Saline Lot Number [id number]  Dialyzer Lot Number [id number]  Disposable Set Lot Number [id number]  Machine Temperature 98.6 F (37 C)  Musician and Audible Yes  Blood Lines Intact and Secured Yes  Pre Treatment Patient Checks  Vascular access used during treatment Catheter  Hepatitis B Surface Antigen Results Negative  Date Hepatitis B Surface Antigen Drawn 01/24/19  Hemodialysis Consent Verified Yes  Hemodialysis Standing Orders Initiated Yes  ECG (Telemetry) Monitor On Yes  Prime Ordered Normal Saline  Length of  DialysisTreatment -hour(s) 3 Hour(s)  Dialyzer Elisio 17H NR  Dialysate 3K, 2.5 Ca  Dialysate Flow Ordered 600  Blood Flow Rate Ordered 300 mL/min   Ultrafiltration Goal 2000 Liters  During Hemodialysis Assessment  Blood Flow Rate (mL/min) 300 mL/min  Arterial Pressure (mmHg) -110 mmHg  Venous Pressure (mmHg) 80 mmHg  Transmembrane Pressure (mmHg) 70 mmHg  Ultrafiltration Rate (mL/min) 830 mL/min  Dialysate Flow Rate (mL/min) 600 ml/min  Conductivity: Machine  14.1  HD Safety Checks Performed Yes  Dialysis Fluid Bolus Normal Saline (250 prime)  Bolus Amount (mL) 250 mL

## 2019-02-06 NOTE — Consult Note (Signed)
ANTICOAGULATION CONSULT NOTE - Initial Consult  Pharmacy Consult for Apixaban Indication: thrombosis complication during bypass  Allergies  Allergen Reactions  . Codeine Nausea Only    Patient questioned this (??)  . Sulfa Antibiotics Hives and Nausea And Vomiting    Patient Measurements: Height: 5\' 2"  (157.5 cm) Weight: 100 lb 15.5 oz (45.8 kg) IBW/kg (Calculated) : 54.6   Vital Signs: Temp: 98 F (36.7 C) (04/17 0942) Temp Source: Axillary (04/17 0942) BP: 151/87 (04/17 1300) Pulse Rate: 88 (04/17 1300)  Labs: Recent Labs    02/04/19 1237 02/04/19 1526 02/05/19 0251 02/06/19 0514  HGB 10.8* 9.4* 9.2* 8.2*  HCT 34.0* 30.2* 29.1* 25.8*  PLT 285 182 140* 166  CREATININE 9.19* 9.41* 5.84* 7.76*  TROPONINI 0.07*  --   --   --     Estimated Creatinine Clearance: 7.8 mL/min (A) (by C-G formula based on SCr of 7.76 mg/dL (H)).   Medical History: Past Medical History:  Diagnosis Date  . Hypertension   . 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 Patient was not felt to be a candidate for warfarin as he is currently homeless and warfarin requires frequent INR monitoring.  Goal of Therapy:  Monitor platelets by anticoagulation protocol: Yes   Plan: Will continue apixaban 2.5 mg BID. Although apixaban is only reduced to 2.5 mg BID for atrial fibrillation only, patient is ESRD and 46 kg. H&H WNL. CBC ordered with AM labs.   Paticia Stack, PharmD Pharmacy Resident  02/06/2019 2:29 PM

## 2019-02-07 ENCOUNTER — Other Ambulatory Visit: Payer: Self-pay

## 2019-02-07 LAB — BASIC METABOLIC PANEL
Anion gap: 17 — ABNORMAL HIGH (ref 5–15)
BUN: 43 mg/dL — ABNORMAL HIGH (ref 6–20)
CO2: 24 mmol/L (ref 22–32)
Calcium: 7.9 mg/dL — ABNORMAL LOW (ref 8.9–10.3)
Chloride: 101 mmol/L (ref 98–111)
Creatinine, Ser: 5.89 mg/dL — ABNORMAL HIGH (ref 0.61–1.24)
GFR calc Af Amer: 12 mL/min — ABNORMAL LOW (ref >60)
GFR calc non Af Amer: 11 mL/min — ABNORMAL LOW (ref >60)
Glucose, Bld: 87 mg/dL (ref 70–99)
Potassium: 3.6 mmol/L (ref 3.5–5.1)
Sodium: 142 mmol/L (ref 135–145)

## 2019-02-07 LAB — CULTURE, BLOOD (ROUTINE X 2): Special Requests: ADEQUATE

## 2019-02-07 LAB — CBC
HCT: 29.6 % — ABNORMAL LOW (ref 39.0–52.0)
Hemoglobin: 9.5 g/dL — ABNORMAL LOW (ref 13.0–17.0)
MCH: 30.3 pg (ref 26.0–34.0)
MCHC: 32.1 g/dL (ref 30.0–36.0)
MCV: 94.3 fL (ref 80.0–100.0)
Platelets: 206 10*3/uL (ref 150–400)
RBC: 3.14 MIL/uL — ABNORMAL LOW (ref 4.22–5.81)
RDW: 17.3 % — ABNORMAL HIGH (ref 11.5–15.5)
WBC: 6.7 10*3/uL (ref 4.0–10.5)
nRBC: 0 % (ref 0.0–0.2)

## 2019-02-07 LAB — PHOSPHORUS: Phosphorus: 6.9 mg/dL — ABNORMAL HIGH (ref 2.5–4.6)

## 2019-02-07 MED ORDER — RENA-VITE PO TABS
1.0000 | ORAL_TABLET | Freq: Every day | ORAL | Status: DC
  Administered 2019-02-07 – 2019-02-09 (×3): 1 via ORAL
  Filled 2019-02-07 (×3): qty 1

## 2019-02-07 MED ORDER — SEVELAMER CARBONATE 800 MG PO TABS
1600.0000 mg | ORAL_TABLET | Freq: Three times a day (TID) | ORAL | Status: DC
  Administered 2019-02-07 – 2019-02-09 (×8): 1600 mg via ORAL
  Filled 2019-02-07 (×7): qty 2

## 2019-02-07 MED ORDER — GABAPENTIN 100 MG PO CAPS
100.0000 mg | ORAL_CAPSULE | Freq: Three times a day (TID) | ORAL | Status: DC
  Administered 2019-02-07 – 2019-02-09 (×8): 100 mg via ORAL
  Filled 2019-02-07 (×8): qty 1

## 2019-02-07 MED ORDER — CARVEDILOL 25 MG PO TABS
25.0000 mg | ORAL_TABLET | Freq: Two times a day (BID) | ORAL | Status: DC
  Administered 2019-02-07 – 2019-02-08 (×4): 25 mg via ORAL
  Filled 2019-02-07 (×4): qty 1

## 2019-02-07 MED ORDER — ASPIRIN EC 81 MG PO TBEC
81.0000 mg | DELAYED_RELEASE_TABLET | Freq: Every day | ORAL | Status: DC
  Administered 2019-02-07 – 2019-02-09 (×3): 81 mg via ORAL
  Filled 2019-02-07 (×3): qty 1

## 2019-02-07 MED ORDER — HYDROXYZINE HCL 25 MG PO TABS
25.0000 mg | ORAL_TABLET | Freq: Three times a day (TID) | ORAL | Status: DC | PRN
  Administered 2019-02-07 – 2019-02-09 (×4): 25 mg via ORAL
  Filled 2019-02-07 (×6): qty 1

## 2019-02-07 MED ORDER — HYDRALAZINE HCL 25 MG PO TABS
25.0000 mg | ORAL_TABLET | Freq: Three times a day (TID) | ORAL | Status: DC
  Administered 2019-02-07 – 2019-02-09 (×6): 25 mg via ORAL
  Filled 2019-02-07 (×6): qty 1

## 2019-02-07 MED ORDER — ATORVASTATIN CALCIUM 20 MG PO TABS
80.0000 mg | ORAL_TABLET | Freq: Every day | ORAL | Status: DC
  Administered 2019-02-07 – 2019-02-08 (×2): 80 mg via ORAL
  Filled 2019-02-07 (×2): qty 4

## 2019-02-07 MED ORDER — ESCITALOPRAM OXALATE 10 MG PO TABS
10.0000 mg | ORAL_TABLET | Freq: Every day | ORAL | Status: DC
  Administered 2019-02-07 – 2019-02-09 (×3): 10 mg via ORAL
  Filled 2019-02-07 (×3): qty 1

## 2019-02-07 MED ORDER — AMLODIPINE BESYLATE 5 MG PO TABS
5.0000 mg | ORAL_TABLET | Freq: Every day | ORAL | Status: DC
  Administered 2019-02-08: 5 mg via ORAL
  Filled 2019-02-07 (×2): qty 1

## 2019-02-07 NOTE — Care Plan (Signed)
Extubated yesterday without sequela.  No respiratory distress after extubation.  Hemodynamically stable.  Transfer to medical surgical floor.  Patient is currently floor status.  PCCM will sign off.

## 2019-02-07 NOTE — Progress Notes (Signed)
[facility]  ROUNDING NOTE   Subjective:  Patient seen at bedside. Now extubated. Completed dialysis yesterday.    Objective:  Vital signs in last 24 hours:  Temp:  [97.5 F (36.4 C)-98.7 F (37.1 C)] 98.5 F (36.9 C) (04/18 0400) Pulse Rate:  [74-101] 74 (04/18 0400) Resp:  [9-18] 18 (04/18 0400) BP: (110-172)/(71-97) 148/92 (04/18 0400) SpO2:  [96 %-100 %] 96 % (04/18 0400) FiO2 (%):  [30 %] 30 % (04/17 1403)  Weight change:  Filed Weights   02/04/19 1600 02/04/19 1630 02/04/19 1941  Weight: 49 kg 47.8 kg 45.8 kg    Intake/Output: I/O last 3 completed shifts: In: 364.3 [I.V.:204.3; NG/GT:60; IV Piggyback:100] Out: 2332 [Urine:332; Other:2000]   Intake/Output this shift:  No intake/output data recorded.  Physical Exam: General: Laying in bed, no acute distress  Head: Normocephalic, atraumatic.  Eyes: Anicteric  Neck: Supple  Lungs:  Scattered rhonchi bilateral, normal effort  Heart: Regular, no rubs  Abdomen:  Soft, nontender, bowel sounds present  Extremities: no peripheral edema.  Neurologic: Awake, alert, follows commands  Skin: No lesions      Access: left arm AVG +thrill and +bruit    Basic Metabolic Panel: Recent Labs  Lab 02/04/19 1237 02/04/19 1526 02/04/19 1619 02/05/19 0251 02/06/19 0514 02/07/19 0317  NA 142  --   --  140 141 142  K 3.8  --   --  4.1 3.9 3.6  CL 106  --   --  97* 99 101  CO2 18*  --   --  23 24 24   GLUCOSE 158*  --   --  123* 92 87  BUN 73*  --   --  44* 73* 43*  CREATININE 9.19* 9.41*  --  5.84* 7.76* 5.89*  CALCIUM 8.3*  --   --  8.5* 7.7* 7.9*  PHOS  --   --  6.7*  --   --  6.9*    Liver Function Tests: Recent Labs  Lab 02/04/19 1237  AST 31  ALT 12  ALKPHOS 82  BILITOT 1.3*  PROT 7.5  ALBUMIN 3.4*   No results for input(s): LIPASE, AMYLASE in the last 168 hours. No results for input(s): AMMONIA in the last 168 hours.  CBC: Recent Labs  Lab 02/04/19 1237 02/04/19 1526 02/05/19 0251  02/06/19 0514 02/07/19 0317  WBC 14.4* 10.7* 6.2 6.1 6.7  NEUTROABS 11.8*  --   --   --   --   HGB 10.8* 9.4* 9.2* 8.2* 9.5*  HCT 34.0* 30.2* 29.1* 25.8* 29.6*  MCV 98.3 98.7 97.7 96.6 94.3  PLT 285 182 140* 166 206    Cardiac Enzymes: Recent Labs  Lab 02/04/19 1237  TROPONINI 0.07*    BNP: Invalid input(s): POCBNP  CBG: Recent Labs  Lab 02/04/19 1606  GLUCAP 183*    Microbiology: Results for orders placed or performed during the hospital encounter of 02/04/19  Blood culture (routine x 2)     Status: Abnormal (Preliminary result)   Collection Time: 02/04/19 12:19 PM  Result Value Ref Range Status   Specimen Description BLOOD RIGHT ANTECUBITAL  Final   Special Requests   Final    BOTTLES DRAWN AEROBIC AND ANAEROBIC Blood Culture adequate volume   Culture  Setup Time   Final    GRAM POSITIVE COCCI IN BOTH AEROBIC AND ANAEROBIC BOTTLES CRITICAL RESULT CALLED TO, READ BACK BY AND VERIFIED WITH: [facility] DUNCAN AT 9562 02/05/2019 SDR    Culture STAPHYLOCOCCUS EPIDERMIDIS (A)  Final   Report Status PENDING  Incomplete   Organism ID, Bacteria STAPHYLOCOCCUS EPIDERMIDIS  Final      Susceptibility   Staphylococcus epidermidis - MIC*    CIPROFLOXACIN >=8 RESISTANT Resistant     ERYTHROMYCIN >=8 RESISTANT Resistant     GENTAMICIN 8 INTERMEDIATE Intermediate     OXACILLIN >=4 RESISTANT Resistant     TETRACYCLINE 2 SENSITIVE Sensitive     VANCOMYCIN 2 SENSITIVE Sensitive     TRIMETH/SULFA 160 RESISTANT Resistant     CLINDAMYCIN >=8 RESISTANT Resistant     RIFAMPIN <=0.5 SENSITIVE Sensitive     Inducible Clindamycin Value in next row Sensitive      NEGATIVEPerformed at [facility] [address], [place], [place] [address]    * STAPHYLOCOCCUS EPIDERMIDIS  Blood Culture ID Panel (Reflexed)     Status: Abnormal   Collection Time: 02/04/19 12:19 PM  Result Value Ref Range Status   Enterococcus species NOT DETECTED NOT DETECTED Final   Listeria monocytogenes NOT  DETECTED NOT DETECTED Final   Staphylococcus species DETECTED (A) NOT DETECTED Final    Comment: Methicillin (oxacillin) resistant coagulase negative staphylococcus. Possible blood culture contaminant (unless isolated from more than one blood culture draw or clinical case suggests pathogenicity). No antibiotic treatment is indicated for blood  culture contaminants. CRITICAL RESULT CALLED TO, READ BACK BY AND VERIFIED WITH:  [facility] DUNCAN AT 4782 02/05/2019 SDR    Staphylococcus aureus (BCID) NOT DETECTED NOT DETECTED Final   Methicillin resistance DETECTED (A) NOT DETECTED Final    Comment: CRITICAL RESULT CALLED TO, READ BACK BY AND VERIFIED WITH:  ASAJAH DUNCAN AT 0715 02/05/2019 SDR    Streptococcus species NOT DETECTED NOT DETECTED Final   Streptococcus agalactiae NOT DETECTED NOT DETECTED Final   Streptococcus pneumoniae NOT DETECTED NOT DETECTED Final   Streptococcus pyogenes NOT DETECTED NOT DETECTED Final   Acinetobacter baumannii NOT DETECTED NOT DETECTED Final   Enterobacteriaceae species NOT DETECTED NOT DETECTED Final   Enterobacter cloacae complex NOT DETECTED NOT DETECTED Final   Escherichia coli NOT DETECTED NOT DETECTED Final   Klebsiella oxytoca NOT DETECTED NOT DETECTED Final   Klebsiella pneumoniae NOT DETECTED NOT DETECTED Final   Proteus species NOT DETECTED NOT DETECTED Final   Serratia marcescens NOT DETECTED NOT DETECTED Final   Haemophilus influenzae NOT DETECTED NOT DETECTED Final   Neisseria meningitidis NOT DETECTED NOT DETECTED Final   Pseudomonas aeruginosa NOT DETECTED NOT DETECTED Final   Candida albicans NOT DETECTED NOT DETECTED Final   Candida glabrata NOT DETECTED NOT DETECTED Final   Candida krusei NOT DETECTED NOT DETECTED Final   Candida parapsilosis NOT DETECTED NOT DETECTED Final   Candida tropicalis NOT DETECTED NOT DETECTED Final    Comment: Performed at [facility], [address] [place], [place] [address]  SARS Coronavirus 2  [facility] order, Performed in [place] hospital lab)     Status: None   Collection Time: 02/04/19 12:20 PM  Result Value Ref Range Status   SARS Coronavirus 2 NEGATIVE NEGATIVE Final    Comment: (NOTE) If result is NEGATIVE SARS-CoV-2 target nucleic acids are NOT DETECTED. The SARS-CoV-2 RNA is generally detectable in upper and lower  respiratory specimens during the acute phase of infection. The lowest  concentration of SARS-CoV-2 viral copies this assay can detect is 250  copies / mL. A negative result does not preclude SARS-CoV-2 infection  and should not be used as the sole basis for treatment or other  patient management  decisions.  A negative result may occur with  improper specimen collection / handling, submission of specimen other  than nasopharyngeal swab, presence of viral mutation(s) within the  areas targeted by this assay, and inadequate number of viral copies  (<250 copies / mL). A negative result must be combined with clinical  observations, patient history, and epidemiological information. If result is POSITIVE SARS-CoV-2 target nucleic acids are DETECTED. The SARS-CoV-2 RNA is generally detectable in upper and lower  respiratory specimens dur ing the acute phase of infection.  Positive  results are indicative of active infection with SARS-CoV-2.  Clinical  correlation with patient history and other diagnostic information is  necessary to determine patient infection status.  Positive results do  not rule out bacterial infection or co-infection with other viruses. If result is PRESUMPTIVE POSTIVE SARS-CoV-2 nucleic acids MAY BE PRESENT.   A presumptive positive result was obtained on the submitted specimen  and confirmed on repeat testing.  While 2019 novel coronavirus  (SARS-CoV-2) nucleic acids may be present in the submitted sample  additional confirmatory testing may be necessary for epidemiological  and / or clinical management purposes  to differentiate  between  SARS-CoV-2 and other Sarbecovirus currently known to infect humans.  If clinically indicated additional testing with an alternate test  methodology [phone]) is advised. The SARS-CoV-2 RNA is generally  detectable in upper and lower respiratory sp ecimens during the acute  phase of infection. The expected result is Negative. Fact Sheet for Patients:  StrictlyIdeas.no Fact Sheet for Healthcare Providers: BankingDealers.co.za This test is not yet approved or cleared by the [place] FDA and has been authorized for detection and/or diagnosis of SARS-CoV-2 by FDA under an Emergency Use Authorization (EUA).  This EUA will remain in effect (meaning this test can be used) for the duration of the COVID-19 declaration under Section 564(b)(1) of the Act, 21 U.S.C. section 360bbb-3(b)(1), unless the authorization is terminated or revoked sooner. Performed at [facility], [place] [address] [place], [place] [address]   Blood culture (routine x 2)     Status: Abnormal   Collection Time: 02/04/19 12:24 PM  Result Value Ref Range Status   Specimen Description   Final    BLOOD RIGHT ANTECUBITAL Performed at [facility], [address] [place], [place] [address]    Special Requests   Final    BOTTLES DRAWN AEROBIC AND ANAEROBIC Blood Culture adequate volume Performed at [facility], [address], [place], [place] [address]    Culture  Setup Time   Final    GRAM POSITIVE COCCI AEROBIC BOTTLE ONLY CRITICAL VALUE NOTED.  VALUE IS CONSISTENT WITH PREVIOUSLY REPORTED AND CALLED VALUE. Performed at [facility], [address] [place], [place] [address]    Culture (A)  Final    STAPHYLOCOCCUS EPIDERMIDIS SUSCEPTIBILITIES PERFORMED ON PREVIOUS CULTURE WITHIN THE LAST 5 DAYS. Performed at [facility], [place] [address], [place], [place] [address]    Report Status 02/07/2019 FINAL  Final  MRSA PCR  Screening     Status: None   Collection Time: 02/04/19  4:51 PM  Result Value Ref Range Status   MRSA by PCR NEGATIVE NEGATIVE Final    Comment:        The GeneXpert MRSA Assay (FDA approved for NASAL specimens only), is one component of a comprehensive MRSA colonization surveillance program. It is not intended to diagnose MRSA infection nor to guide or monitor treatment for MRSA infections. Performed at  [facility], [address] [place], [place] [address]   CULTURE, BLOOD (ROUTINE X 2) w Reflex to ID Panel     Status: None (Preliminary result)   Collection Time: 02/05/19  9:39 PM  Result Value Ref Range Status   Specimen Description BLOOD BLOOD RIGHT FOREARM  Final   Special Requests   Final    BOTTLES DRAWN AEROBIC AND ANAEROBIC Blood Culture adequate volume   Culture   Final    NO GROWTH 2 DAYS Performed at [facility], [address] [place], [place] [address]    Report Status PENDING  Incomplete  CULTURE, BLOOD (ROUTINE X 2) w Reflex to ID Panel     Status: None (Preliminary result)   Collection Time: 02/05/19  9:46 PM  Result Value Ref Range Status   Specimen Description BLOOD CATH SITE  Final   Special Requests   Final    BOTTLES DRAWN AEROBIC AND ANAEROBIC Blood Culture adequate volume   Culture   Final    NO GROWTH 2 DAYS Performed at [facility], [address] [place], [place] [address]    Report Status PENDING  Incomplete    Coagulation Studies: No results for input(s): LABPROT, INR in the last 72 hours.  Urinalysis: No results for input(s): COLORURINE, LABSPEC, PHURINE, GLUCOSEU, HGBUR, BILIRUBINUR, KETONESUR, PROTEINUR, UROBILINOGEN, NITRITE, LEUKOCYTESUR in the last 72 hours.  Invalid input(s): APPERANCEUR    Imaging: Dg Chest Port 1 View  Result Date: 02/06/2019 CLINICAL DATA:  Acute respiratory failure EXAM: PORTABLE CHEST 1 VIEW COMPARISON:  02/05/2019 FINDINGS: Cardiac shadow is stable. Endotracheal tube,  gastric catheter and dialysis catheter are again noted and stable. Vascular stenting in the left axillary region is noted. The lungs are well aerated with persistent bibasilar infiltrates although significantly improved when compared with the prior exam. No bony abnormality is noted. IMPRESSION: Improved aeration in the bases bilaterally. Electronically Signed   By: Inez Catalina M.D.   On: 02/06/2019 03:27     Medications:   . sodium chloride    . sodium chloride     . amLODipine  5 mg Oral Q2000  . apixaban  2.5 mg Oral BID  . aspirin EC  81 mg Oral Daily  . atorvastatin  80 mg Oral QHS  . carvedilol  25 mg Oral BID WC  . Chlorhexidine Gluconate Cloth  6 each Topical Daily  . escitalopram  10 mg Oral Daily  . gabapentin  100 mg Oral TID  . hydrALAZINE  25 mg Oral Q8H  . mouth rinse  15 mL Mouth Rinse q12n4p  . multivitamin  1 tablet Oral Daily  . pantoprazole (PROTONIX) IV  40 mg Intravenous Q24H  . sevelamer carbonate  1,600 mg Oral TID WC  . sodium chloride flush  3 mL Intravenous Q12H  . vancomycin variable dose per unstable renal function (pharmacist dosing)   Does not apply See admin instructions   sodium chloride, sodium chloride, acetaminophen **OR** acetaminophen, heparin, hydrOXYzine, lidocaine (PF), lidocaine-prilocaine, [DISCONTINUED] ondansetron **OR** ondansetron (ZOFRAN) IV, pentafluoroprop-tetrafluoroeth, phenol, sodium chloride flush  Assessment/ Plan:  [name] is a 43 y.o. white male with end stage renal disease on hemodialysis, hypertension, depression, aortobifemoral bypass, admission for depression 3/20, now admitted with acute respiratory failure after missed dialysis.  Patient is homeless  [place]. TTS schedule.   1.  ESRD: Patient completed dialysis yesterday.  PermCath removed.  We will see if his fistula can now be used.  2.  Acute respiratory failure.  Now extubated and breathing comfortably.  3.  Positive blood culture.   Coagulase negative Staphylococcus noted.  PermCath removed yesterday.  4.  Anemia of chronic kidney disease.  Consider restarting Epogen on Monday.  5.  Secondary hyperparathyroidism.  Phosphorus 6.9 at last check.  Continue Renvela 2 tablets p.o. 3 times daily with meals.   LOS: 3 Brett Small 4/18/202012:01 PM

## 2019-02-07 NOTE — Progress Notes (Signed)
Patient ID: Brett Small, male   DOB: [birth year], 43 y.o.   MRN: [id number]  [facility] PROGRESS NOTE  [place] YJE:[id number] DOB: 05-14-1976 DOA: 02/04/2019 PCP: Patient, [name]  HPI/Subjective: Patient was seen while on dialysis and he was still intubated.  Was able to follow some simple commands and shake his head yes or no to some questions.  Objective: Vitals:   02/07/19 1000 02/07/19 1200  BP: (!) 156/95 133/83  Pulse:    Resp: 14 17  Temp:  98.2 F (36.8 C)  SpO2:  98%    Filed Weights   02/04/19 1600 02/04/19 1630 02/04/19 1941  Weight: 49 kg 47.8 kg 45.8 kg    ROS: Review of Systems  Unable to perform ROS: Intubated  Respiratory: Positive for shortness of breath.   Cardiovascular: Negative for chest pain.  Gastrointestinal: Negative for abdominal pain.   Exam: Physical Exam  HENT:  Nose: No mucosal edema.  Mouth/Throat: No oropharyngeal exudate or posterior oropharyngeal edema.  Eyes: Pupils are equal, round, and reactive to light. Conjunctivae, EOM and lids are normal.  Neck: No JVD present. Carotid bruit is not present. No edema present. No thyroid mass and no thyromegaly present.  Cardiovascular: S1 normal and S2 normal. Exam reveals no gallop.  No murmur heard. Pulses:      Dorsalis pedis pulses are 2+ on the right side and 2+ on the left side.  Respiratory: No respiratory distress. He has decreased breath sounds in the right lower field and the left lower field. He has no wheezes. He has no rhonchi. He has rales in the right lower field and the left lower field.  GI: Soft. Bowel sounds are normal. There is no abdominal tenderness.  Musculoskeletal:     Right ankle: He exhibits no swelling.     Left ankle: He exhibits no swelling.  Lymphadenopathy:    He has no cervical adenopathy.  Neurological: He is alert.  Able to squeeze hands to commands.  Skin: Skin is warm. No rash noted. Nails show no clubbing.  Psychiatric:  Follows  commands.      Data Reviewed: Basic Metabolic Panel: Recent Labs  Lab 02/04/19 1237 02/04/19 1526 02/04/19 1619 02/05/19 0251 02/06/19 0514 02/07/19 0317  NA 142  --   --  140 141 142  K 3.8  --   --  4.1 3.9 3.6  CL 106  --   --  97* 99 101  CO2 18*  --   --  23 24 24   GLUCOSE 158*  --   --  123* 92 87  BUN 73*  --   --  44* 73* 43*  CREATININE 9.19* 9.41*  --  5.84* 7.76* 5.89*  CALCIUM 8.3*  --   --  8.5* 7.7* 7.9*  PHOS  --   --  6.7*  --   --  6.9*   Liver Function Tests: Recent Labs  Lab 02/04/19 1237  AST 31  ALT 12  ALKPHOS 82  BILITOT 1.3*  PROT 7.5  ALBUMIN 3.4*   CBC: Recent Labs  Lab 02/04/19 1237 02/04/19 1526 02/05/19 0251 02/06/19 0514 02/07/19 0317  WBC 14.4* 10.7* 6.2 6.1 6.7  NEUTROABS 11.8*  --   --   --   --   HGB 10.8* 9.4* 9.2* 8.2* 9.5*  HCT 34.0* 30.2* 29.1* 25.8* 29.6*  MCV 98.3 98.7 97.7 96.6 94.3  PLT 285 182 140* 166 206   Cardiac Enzymes: Recent Labs  Lab 02/04/19  1237  TROPONINI 0.07*   BNP (last 3 results) Recent Labs    12/24/18 2355  BNP 2,318.1*     CBG: Recent Labs  Lab 02/04/19 1606  GLUCAP 183*    Recent Results (from the past 240 hour(s))  Blood culture (routine x 2)     Status: Abnormal (Preliminary result)   Collection Time: 02/04/19 12:19 PM  Result Value Ref Range Status   Specimen Description BLOOD RIGHT ANTECUBITAL  Final   Special Requests   Final    BOTTLES DRAWN AEROBIC AND ANAEROBIC Blood Culture adequate volume   Culture  Setup Time   Final    GRAM POSITIVE COCCI IN BOTH AEROBIC AND ANAEROBIC BOTTLES CRITICAL RESULT CALLED TO, READ BACK BY AND VERIFIED WITH: [facility] DUNCAN AT 0715 02/05/2019 SDR    Culture STAPHYLOCOCCUS EPIDERMIDIS (A)  Final   Report Status PENDING  Incomplete   Organism ID, Bacteria STAPHYLOCOCCUS EPIDERMIDIS  Final      Susceptibility   Staphylococcus epidermidis - MIC*    CIPROFLOXACIN >=8 RESISTANT Resistant     ERYTHROMYCIN >=8 RESISTANT Resistant      GENTAMICIN 8 INTERMEDIATE Intermediate     OXACILLIN >=4 RESISTANT Resistant     TETRACYCLINE 2 SENSITIVE Sensitive     VANCOMYCIN 2 SENSITIVE Sensitive     TRIMETH/SULFA 160 RESISTANT Resistant     CLINDAMYCIN >=8 RESISTANT Resistant     RIFAMPIN <=0.5 SENSITIVE Sensitive     Inducible Clindamycin Value in next row Sensitive      NEGATIVEPerformed at [facility], [address], [place], [place] [address]    * STAPHYLOCOCCUS EPIDERMIDIS  Blood Culture ID Panel (Reflexed)     Status: Abnormal   Collection Time: 02/04/19 12:19 PM  Result Value Ref Range Status   Enterococcus species NOT DETECTED NOT DETECTED Final   Listeria monocytogenes NOT DETECTED NOT DETECTED Final   Staphylococcus species DETECTED (A) NOT DETECTED Final    Comment: Methicillin (oxacillin) resistant coagulase negative staphylococcus. Possible blood culture contaminant (unless isolated from more than one blood culture draw or clinical case suggests pathogenicity). No antibiotic treatment is indicated for blood  culture contaminants. CRITICAL RESULT CALLED TO, READ BACK BY AND VERIFIED WITH:  [facility] DUNCAN AT 6283 02/05/2019 SDR    Staphylococcus aureus (BCID) NOT DETECTED NOT DETECTED Final   Methicillin resistance DETECTED (A) NOT DETECTED Final    Comment: CRITICAL RESULT CALLED TO, READ BACK BY AND VERIFIED WITH:  [facility] DUNCAN AT 0715 02/05/2019 SDR    Streptococcus species NOT DETECTED NOT DETECTED Final   Streptococcus agalactiae NOT DETECTED NOT DETECTED Final   Streptococcus pneumoniae NOT DETECTED NOT DETECTED Final   Streptococcus pyogenes NOT DETECTED NOT DETECTED Final   Acinetobacter baumannii NOT DETECTED NOT DETECTED Final   Enterobacteriaceae species NOT DETECTED NOT DETECTED Final   Enterobacter cloacae complex NOT DETECTED NOT DETECTED Final   Escherichia coli NOT DETECTED NOT DETECTED Final   Klebsiella oxytoca NOT DETECTED NOT DETECTED Final   Klebsiella pneumoniae NOT DETECTED NOT  DETECTED Final   Proteus species NOT DETECTED NOT DETECTED Final   Serratia marcescens NOT DETECTED NOT DETECTED Final   Haemophilus influenzae NOT DETECTED NOT DETECTED Final   Neisseria meningitidis NOT DETECTED NOT DETECTED Final   Pseudomonas aeruginosa NOT DETECTED NOT DETECTED Final   Candida albicans NOT DETECTED NOT DETECTED Final   Candida glabrata NOT DETECTED NOT DETECTED Final   Candida krusei NOT DETECTED NOT DETECTED Final   Candida parapsilosis NOT DETECTED NOT  DETECTED Final   Candida tropicalis NOT DETECTED NOT DETECTED Final    Comment: Performed at [facility], [address] [place], [place] [address]  SARS Coronavirus 2 [facility] order, Performed in [place] hospital lab)     Status: None   Collection Time: 02/04/19 12:20 PM  Result Value Ref Range Status   SARS Coronavirus 2 NEGATIVE NEGATIVE Final    Comment: (NOTE) If result is NEGATIVE SARS-CoV-2 target nucleic acids are NOT DETECTED. The SARS-CoV-2 RNA is generally detectable in upper and lower  respiratory specimens during the acute phase of infection. The lowest  concentration of SARS-CoV-2 viral copies this assay can detect is 250  copies / mL. A negative result does not preclude SARS-CoV-2 infection  and should not be used as the sole basis for treatment or other  patient management decisions.  A negative result may occur with  improper specimen collection / handling, submission of specimen other  than nasopharyngeal swab, presence of viral mutation(s) within the  areas targeted by this assay, and inadequate number of viral copies  (<250 copies / mL). A negative result must be combined with clinical  observations, patient history, and epidemiological information. If result is POSITIVE SARS-CoV-2 target nucleic acids are DETECTED. The SARS-CoV-2 RNA is generally detectable in upper and lower  respiratory specimens dur ing the acute phase of infection.  Positive  results are indicative of  active infection with SARS-CoV-2.  Clinical  correlation with patient history and other diagnostic information is  necessary to determine patient infection status.  Positive results do  not rule out bacterial infection or co-infection with other viruses. If result is PRESUMPTIVE POSTIVE SARS-CoV-2 nucleic acids MAY BE PRESENT.   A presumptive positive result was obtained on the submitted specimen  and confirmed on repeat testing.  While 2019 novel coronavirus  (SARS-CoV-2) nucleic acids may be present in the submitted sample  additional confirmatory testing may be necessary for epidemiological  and / or clinical management purposes  to differentiate between  SARS-CoV-2 and other Sarbecovirus currently known to infect humans.  If clinically indicated additional testing with an alternate test  methodology [phone]) is advised. The SARS-CoV-2 RNA is generally  detectable in upper and lower respiratory sp ecimens during the acute  phase of infection. The expected result is Negative. Fact Sheet for Patients:  StrictlyIdeas.no Fact Sheet for Healthcare Providers: BankingDealers.co.za This test is not yet approved or cleared by the [place] FDA and has been authorized for detection and/or diagnosis of SARS-CoV-2 by FDA under an Emergency Use Authorization (EUA).  This EUA will remain in effect (meaning this test can be used) for the duration of the COVID-19 declaration under Section 564(b)(1) of the Act, 21 U.S.C. section 360bbb-3(b)(1), unless the authorization is terminated or revoked sooner. Performed at [facility], [place] [address] [place], [place] [address]   Blood culture (routine x 2)     Status: Abnormal   Collection Time: 02/04/19 12:24 PM  Result Value Ref Range Status   Specimen Description   Final    BLOOD RIGHT ANTECUBITAL Performed at [facility], [address], [place], [place] [address]    Special  Requests   Final    BOTTLES DRAWN AEROBIC AND ANAEROBIC Blood Culture adequate volume Performed at [facility], [address], [place], [place] [address]    Culture  Setup Time   Final    GRAM POSITIVE COCCI AEROBIC BOTTLE ONLY CRITICAL VALUE NOTED.  VALUE IS CONSISTENT  WITH PREVIOUSLY REPORTED AND CALLED VALUE. Performed at [facility], [address] [place], [place] [address]    Culture (A)  Final    STAPHYLOCOCCUS EPIDERMIDIS SUSCEPTIBILITIES PERFORMED ON PREVIOUS CULTURE WITHIN THE LAST 5 DAYS. Performed at [facility], [place] [address] [place], [place] [address]    Report Status 02/07/2019 FINAL  Final  MRSA PCR Screening     Status: None   Collection Time: 02/04/19  4:51 PM  Result Value Ref Range Status   MRSA by PCR NEGATIVE NEGATIVE Final    Comment:        The GeneXpert MRSA Assay (FDA approved for NASAL specimens only), is one component of a comprehensive MRSA colonization surveillance program. It is not intended to diagnose MRSA infection nor to guide or monitor treatment for MRSA infections. Performed at [facility], [address] [place], [place] [address]   CULTURE, BLOOD (ROUTINE X 2) w Reflex to ID Panel     Status: None (Preliminary result)   Collection Time: 02/05/19  9:39 PM  Result Value Ref Range Status   Specimen Description BLOOD BLOOD RIGHT FOREARM  Final   Special Requests   Final    BOTTLES DRAWN AEROBIC AND ANAEROBIC Blood Culture adequate volume   Culture   Final    NO GROWTH 2 DAYS Performed at [facility], [address] [place], [place] [address]    Report Status PENDING  Incomplete  CULTURE, BLOOD (ROUTINE X 2) w Reflex to ID Panel     Status: None (Preliminary result)   Collection Time: 02/05/19  9:46 PM  Result Value Ref Range Status   Specimen Description BLOOD CATH SITE  Final   Special Requests   Final    BOTTLES DRAWN AEROBIC AND ANAEROBIC Blood Culture adequate volume    Culture   Final    NO GROWTH 2 DAYS Performed at [facility], [address], [place], [place] [address]    Report Status PENDING  Incomplete     Studies: Dg Chest Port 1 View  Result Date: 02/06/2019 CLINICAL DATA:  Acute respiratory failure EXAM: PORTABLE CHEST 1 VIEW COMPARISON:  02/05/2019 FINDINGS: Cardiac shadow is stable. Endotracheal tube, gastric catheter and dialysis catheter are again noted and stable. Vascular stenting in the left axillary region is noted. The lungs are well aerated with persistent bibasilar infiltrates although significantly improved when compared with the prior exam. No bony abnormality is noted. IMPRESSION: Improved aeration in the bases bilaterally. Electronically Signed   By: Inez Catalina M.D.   On: 02/06/2019 03:27    Scheduled Meds: . amLODipine  5 mg Oral Q2000  . apixaban  2.5 mg Oral BID  . aspirin EC  81 mg Oral Daily  . atorvastatin  80 mg Oral QHS  . carvedilol  25 mg Oral BID WC  . Chlorhexidine Gluconate Cloth  6 each Topical Daily  . escitalopram  10 mg Oral Daily  . gabapentin  100 mg Oral TID  . hydrALAZINE  25 mg Oral Q8H  . mouth rinse  15 mL Mouth Rinse q12n4p  . multivitamin  1 tablet Oral Daily  . pantoprazole (PROTONIX) IV  40 mg Intravenous Q24H  . sevelamer carbonate  1,600 mg Oral TID WC  . sodium chloride flush  3 mL Intravenous Q12H  . vancomycin variable dose per unstable renal function (pharmacist dosing)   Does not apply See admin instructions   Continuous Infusions: . sodium chloride    .  sodium chloride      Assessment/Plan:  1. Acute hypoxic respiratory failure.  Now extubated 2. Acute pulmonary edema secondary to fluid overload.  Acute diastolic congestive heart failure.  Removed 3. Systemic inflammatory response.  Blood culture positive for gram-positive cocci.  Patient on IV vancomycin.  Repeat blood cultures so far negative.  Appreciate infectious disease consultation. 4. End-stage renal disease.   Dialysis again today 5. Anemia of chronic disease 6. History of hypertension.  Hypotensive on presentation.  Continue to monitor off medication  Code Status:     Code Status Orders  (From admission, onward)         Start     Ordered   02/04/19 1342  Full code  Continuous     02/04/19 1343        Code Status History    Date Active Date Inactive Code Status Order ID Comments User Context   01/27/2019 0654 01/29/2019 2049 Partial Code [id number]  Harrie Foreman, MD Inpatient   01/07/2019 2156 01/27/2019 0638 Partial Code [id number]  Tennis Ship, MD Inpatient   12/25/2018 0158 01/07/2019 2027 Partial Code [id number]  Ina Homes, MD ED     Family Communication: As per critical care specialist Disposition Plan: To be determined  Consultants:  Critical care specialist  Nephrology  Antibiotics:  Vancomycin  Time spent: 26 minutes.  Case discussed with critical care specialist.  [facility]

## 2019-02-08 LAB — CULTURE, BLOOD (ROUTINE X 2): Special Requests: ADEQUATE

## 2019-02-08 NOTE — Progress Notes (Signed)
[facility]  ROUNDING NOTE   Subjective:  Patient seen at bedside. Last had dialysis on Friday. PermCath was removed. His left upper extremity AV fistula however appears to be maturing. Consider using fistula tomorrow. Social work also still helping to establish transportation.    Objective:  Vital signs in last 24 hours:  Temp:  [98.2 F (36.8 C)-98.6 F (37 C)] 98.3 F (36.8 C) (04/19 0421) Pulse Rate:  [71-78] 71 (04/19 0421) Resp:  [16-17] 16 (04/19 0421) BP: (113-133)/(61-83) 113/61 (04/19 0554) SpO2:  [97 %-100 %] 97 % (04/19 0421)  Weight change:  Filed Weights   02/04/19 1600 02/04/19 1630 02/04/19 1941  Weight: 49 kg 47.8 kg 45.8 kg    Intake/Output: I/O last 3 completed shifts: In: 31 [P.O.:760] Out: -    Intake/Output this shift:  Total I/O In: 120 [P.O.:120] Out: -   Physical Exam: General: Laying in bed, no acute distress  Head: Normocephalic, atraumatic.  Eyes: Anicteric  Neck: Supple  Lungs:  Scattered rhonchi bilateral, normal effort  Heart: Regular, no rubs  Abdomen:  Soft, nontender, bowel sounds present  Extremities: no peripheral edema.  Neurologic: Awake, alert, follows commands  Skin: No lesions      Access: left arm AVG +thrill and +bruit    Basic Metabolic Panel: Recent Labs  Lab 02/04/19 1237 02/04/19 1526 02/04/19 1619 02/05/19 0251 02/06/19 0514 02/07/19 0317  NA 142  --   --  140 141 142  K 3.8  --   --  4.1 3.9 3.6  CL 106  --   --  97* 99 101  CO2 18*  --   --  23 24 24   GLUCOSE 158*  --   --  123* 92 87  BUN 73*  --   --  44* 73* 43*  CREATININE 9.19* 9.41*  --  5.84* 7.76* 5.89*  CALCIUM 8.3*  --   --  8.5* 7.7* 7.9*  PHOS  --   --  6.7*  --   --  6.9*    Liver Function Tests: Recent Labs  Lab 02/04/19 1237  AST 31  ALT 12  ALKPHOS 82  BILITOT 1.3*  PROT 7.5  ALBUMIN 3.4*   No results for input(s): LIPASE, AMYLASE in the last 168 hours. No results for input(s): AMMONIA in the last 168  hours.  CBC: Recent Labs  Lab 02/04/19 1237 02/04/19 1526 02/05/19 0251 02/06/19 0514 02/07/19 0317  WBC 14.4* 10.7* 6.2 6.1 6.7  NEUTROABS 11.8*  --   --   --   --   HGB 10.8* 9.4* 9.2* 8.2* 9.5*  HCT 34.0* 30.2* 29.1* 25.8* 29.6*  MCV 98.3 98.7 97.7 96.6 94.3  PLT 285 182 140* 166 206    Cardiac Enzymes: Recent Labs  Lab 02/04/19 1237  TROPONINI 0.07*    BNP: Invalid input(s): POCBNP  CBG: Recent Labs  Lab 02/04/19 1606  GLUCAP 183*    Microbiology: Results for orders placed or performed during the hospital encounter of 02/04/19  Blood culture (routine x 2)     Status: Abnormal   Collection Time: 02/04/19 12:19 PM  Result Value Ref Range Status   Specimen Description   Final    BLOOD RIGHT ANTECUBITAL Performed at [facility], [address], [place], [place] [address]    Special Requests   Final    BOTTLES DRAWN AEROBIC AND ANAEROBIC Blood Culture adequate volume Performed at [facility], [address] [place], [place] [address]  Culture  Setup Time   Final    GRAM POSITIVE COCCI IN BOTH AEROBIC AND ANAEROBIC BOTTLES CRITICAL RESULT CALLED TO, READ BACK BY AND VERIFIED WITH: [facility] DUNCAN AT 1696 02/05/2019 [place] Performed at [facility], [address] [place], [place] [address]    Culture STAPHYLOCOCCUS EPIDERMIDIS (A)  Final   Report Status 02/08/2019 FINAL  Final   Organism ID, Bacteria STAPHYLOCOCCUS EPIDERMIDIS  Final      Susceptibility   Staphylococcus epidermidis - MIC*    CIPROFLOXACIN >=8 RESISTANT Resistant     ERYTHROMYCIN >=8 RESISTANT Resistant     GENTAMICIN 8 INTERMEDIATE Intermediate     OXACILLIN >=4 RESISTANT Resistant     TETRACYCLINE 2 SENSITIVE Sensitive     VANCOMYCIN 2 SENSITIVE Sensitive     TRIMETH/SULFA 160 RESISTANT Resistant     CLINDAMYCIN >=8 RESISTANT Resistant     RIFAMPIN <=0.5 SENSITIVE Sensitive     Inducible Clindamycin NEGATIVE Sensitive     * STAPHYLOCOCCUS EPIDERMIDIS   Blood Culture ID Panel (Reflexed)     Status: Abnormal   Collection Time: 02/04/19 12:19 PM  Result Value Ref Range Status   Enterococcus species NOT DETECTED NOT DETECTED Final   Listeria monocytogenes NOT DETECTED NOT DETECTED Final   Staphylococcus species DETECTED (A) NOT DETECTED Final    Comment: Methicillin (oxacillin) resistant coagulase negative staphylococcus. Possible blood culture contaminant (unless isolated from more than one blood culture draw or clinical case suggests pathogenicity). No antibiotic treatment is indicated for blood  culture contaminants. CRITICAL RESULT CALLED TO, READ BACK BY AND VERIFIED WITH:  [facility] DUNCAN AT 1017 02/05/2019 [facility]    Staphylococcus aureus (BCID) NOT DETECTED NOT DETECTED Final   Methicillin resistance DETECTED (A) NOT DETECTED Final    Comment: CRITICAL RESULT CALLED TO, READ BACK BY AND VERIFIED WITH:  ASAJAH DUNCAN AT 0715 02/05/2019 S[facility]    Streptococcus species NOT DETECTED NOT DETECTED Final   Streptococcus agalactiae NOT DETECTED NOT DETECTED Final   Streptococcus pneumoniae NOT DETECTED NOT DETECTED Final   Streptococcus pyogenes NOT DETECTED NOT DETECTED Final   Acinetobacter baumannii NOT DETECTED NOT DETECTED Final   Enterobacteriaceae species NOT DETECTED NOT DETECTED Final   Enterobacter cloacae complex NOT DETECTED NOT DETECTED Final   Escherichia coli NOT DETECTED NOT DETECTED Final   Klebsiella oxytoca NOT DETECTED NOT DETECTED Final   Klebsiella pneumoniae NOT DETECTED NOT DETECTED Final   Proteus species NOT DETECTED NOT DETECTED Final   Serratia marcescens NOT DETECTED NOT DETECTED Final   Haemophilus influenzae NOT DETECTED NOT DETECTED Final   Neisseria meningitidis NOT DETECTED NOT DETECTED Final   Pseudomonas aeruginosa NOT DETECTED NOT DETECTED Final   Candida albicans NOT DETECTED NOT DETECTED Final   Candida glabrata NOT DETECTED NOT DETECTED Final   Candida krusei NOT DETECTED NOT DETECTED Final   Candida  parapsilosis NOT DETECTED NOT DETECTED Final   Candida tropicalis NOT DETECTED NOT DETECTED Final    Comment: Performed at [facility], [address] [place], [place] [address]  SARS Coronavirus 2 [facility] order, Performed in [place] hospital lab)     Status: None   Collection Time: 02/04/19 12:20 PM  Result Value Ref Range Status   SARS Coronavirus 2 NEGATIVE NEGATIVE Final    Comment: (NOTE) If result is NEGATIVE SARS-CoV-2 target nucleic acids are NOT DETECTED. The SARS-CoV-2 RNA is generally detectable in upper and lower  respiratory specimens during the acute phase of infection. The lowest  concentration of SARS-CoV-2 viral  copies this assay can detect is 250  copies / mL. A negative result does not preclude SARS-CoV-2 infection  and should not be used as the sole basis for treatment or other  patient management decisions.  A negative result may occur with  improper specimen collection / handling, submission of specimen other  than nasopharyngeal swab, presence of viral mutation(s) within the  areas targeted by this assay, and inadequate number of viral copies  (<250 copies / mL). A negative result must be combined with clinical  observations, patient history, and epidemiological information. If result is POSITIVE SARS-CoV-2 target nucleic acids are DETECTED. The SARS-CoV-2 RNA is generally detectable in upper and lower  respiratory specimens dur ing the acute phase of infection.  Positive  results are indicative of active infection with SARS-CoV-2.  Clinical  correlation with patient history and other diagnostic information is  necessary to determine patient infection status.  Positive results do  not rule out bacterial infection or co-infection with other viruses. If result is PRESUMPTIVE POSTIVE SARS-CoV-2 nucleic acids MAY BE PRESENT.   A presumptive positive result was obtained on the submitted specimen  and confirmed on repeat testing.  While 2019 novel  coronavirus  (SARS-CoV-2) nucleic acids may be present in the submitted sample  additional confirmatory testing may be necessary for epidemiological  and / or clinical management purposes  to differentiate between  SARS-CoV-2 and other Sarbecovirus currently known to infect humans.  If clinically indicated additional testing with an alternate test  methodology [phone]) is advised. The SARS-CoV-2 RNA is generally  detectable in upper and lower respiratory sp ecimens during the acute  phase of infection. The expected result is Negative. Fact Sheet for Patients:  StrictlyIdeas.no Fact Sheet for Healthcare Providers: BankingDealers.co.za This test is not yet approved or cleared by the [place] FDA and has been authorized for detection and/or diagnosis of SARS-CoV-2 by FDA under an Emergency Use Authorization (EUA).  This EUA will remain in effect (meaning this test can be used) for the duration of the COVID-19 declaration under Section 564(b)(1) of the Act, 21 U.S.C. section 360bbb-3(b)(1), unless the authorization is terminated or revoked sooner. Performed at [facility], [place] [address] [place], [place] [address]   Blood culture (routine x 2)     Status: Abnormal   Collection Time: 02/04/19 12:24 PM  Result Value Ref Range Status   Specimen Description   Final    BLOOD RIGHT ANTECUBITAL Performed at [facility], [address], [place], [place] [address]    Special Requests   Final    BOTTLES DRAWN AEROBIC AND ANAEROBIC Blood Culture adequate volume Performed at [facility], [address], [place], [place] [address]    Culture  Setup Time   Final    GRAM POSITIVE COCCI AEROBIC BOTTLE ONLY CRITICAL VALUE NOTED.  VALUE IS CONSISTENT WITH PREVIOUSLY REPORTED AND CALLED VALUE. Performed at [facility], [address] [place], [place] [address]    Culture (A)  Final    STAPHYLOCOCCUS  EPIDERMIDIS SUSCEPTIBILITIES PERFORMED ON PREVIOUS CULTURE WITHIN THE LAST 5 DAYS. Performed at [facility], [place] [address] [place], [place] [address]    Report Status 02/07/2019 FINAL  Final  MRSA PCR Screening     Status: None   Collection Time: 02/04/19  4:51 PM  Result Value Ref Range Status   MRSA by PCR NEGATIVE NEGATIVE Final    Comment:        The GeneXpert MRSA  Assay (FDA approved for NASAL specimens only), is one component of a comprehensive MRSA colonization surveillance program. It is not intended to diagnose MRSA infection nor to guide or monitor treatment for MRSA infections. Performed at [facility], [address] [place], [place] [address]   CULTURE, BLOOD (ROUTINE X 2) w Reflex to ID Panel     Status: None (Preliminary result)   Collection Time: 02/05/19  9:39 PM  Result Value Ref Range Status   Specimen Description BLOOD BLOOD RIGHT FOREARM  Final   Special Requests   Final    BOTTLES DRAWN AEROBIC AND ANAEROBIC Blood Culture adequate volume   Culture   Final    NO GROWTH 3 DAYS Performed at [facility], [address] [place], [place] [address]    Report Status PENDING  Incomplete  CULTURE, BLOOD (ROUTINE X 2) w Reflex to ID Panel     Status: None (Preliminary result)   Collection Time: 02/05/19  9:46 PM  Result Value Ref Range Status   Specimen Description BLOOD CATH SITE  Final   Special Requests   Final    BOTTLES DRAWN AEROBIC AND ANAEROBIC Blood Culture adequate volume   Culture   Final    NO GROWTH 3 DAYS Performed at [facility], [address], [place], [place] [address]    Report Status PENDING  Incomplete  Cath Tip Culture     Status: None (Preliminary result)   Collection Time: 02/06/19  2:29 PM  Result Value Ref Range Status   Specimen Description   Final    CATH TIP Performed at [facility], [address], [place], [place] [address]    Special Requests   Final    NONE Performed  at [facility], [address] [place], [place] [address]    Culture   Final    NO GROWTH < 24 HOURS Performed at [facility], [place] [address], [place], [place] [address]    Report Status PENDING  Incomplete    Coagulation Studies: No results for input(s): LABPROT, INR in the last 72 hours.  Urinalysis: No results for input(s): COLORURINE, LABSPEC, PHURINE, GLUCOSEU, HGBUR, BILIRUBINUR, KETONESUR, PROTEINUR, UROBILINOGEN, NITRITE, LEUKOCYTESUR in the last 72 hours.  Invalid input(s): APPERANCEUR    Imaging: No results found.   Medications:   . sodium chloride    . sodium chloride     . amLODipine  5 mg Oral Q2000  . apixaban  2.5 mg Oral BID  . aspirin EC  81 mg Oral Daily  . atorvastatin  80 mg Oral QHS  . carvedilol  25 mg Oral BID WC  . Chlorhexidine Gluconate Cloth  6 each Topical Daily  . escitalopram  10 mg Oral Daily  . gabapentin  100 mg Oral TID  . hydrALAZINE  25 mg Oral Q8H  . multivitamin  1 tablet Oral Daily  . pantoprazole (PROTONIX) IV  40 mg Intravenous Q24H  . sevelamer carbonate  1,600 mg Oral TID WC  . sodium chloride flush  3 mL Intravenous Q12H   sodium chloride, sodium chloride, acetaminophen **OR** acetaminophen, heparin, hydrOXYzine, lidocaine (PF), lidocaine-prilocaine, [DISCONTINUED] ondansetron **OR** ondansetron (ZOFRAN) IV, pentafluoroprop-tetrafluoroeth, phenol, sodium chloride flush  Assessment/ Plan:  [name] is a 43 y.o. white male with end stage renal disease on hemodialysis, hypertension, depression, aortobifemoral bypass, admission for depression 3/20, now admitted with acute respiratory failure after missed dialysis.  Patient is homeless  [place]. TTS schedule.  1.  ESRD: Patient last had dialysis on Friday.  Next dialysis treatment on Monday.  Attempt using left upper extremity AV fistula.  PermCath was removed on Friday.  Patient had coagulation negative Staphylococcus in the  blood.  2.  Acute respiratory failure.  Continues to do well post extubation.  3.  Positive blood culture.  Coagulase negative Staphylococcus noted.  PermCath removed 02/06/2019.  Consider using left upper extremity AV fistula tomorrow.  4.  Anemia of chronic kidney disease.  Consider restarting Epogen on Monday.  5.  Secondary hyperparathyroidism.  Recheck serum phosphorus on Monday.  Last phosphorus was 6.9.  Continue Renvela 2 tablets p.o. 3 times daily with meals.   LOS: 4 Evadean Sproule 4/19/202011:59 AM

## 2019-02-08 NOTE — Progress Notes (Addendum)
Patient ID: Brett Small, male   DOB: [birth year], 43 y.o.   MRN: [id number]  [facility] PROGRESS NOTE  Brett Small PIR:[id number] DOB: [birth year] DOA: 02/04/2019 PCP: Patient, [name]  HPI/Subjective: Patient extubated now currently on room air breathing much improved .  Objective: Vitals:   02/08/19 0421 02/08/19 0554  BP: 113/65 113/61  Pulse: 71   Resp: 16   Temp: 98.3 F (36.8 C)   SpO2: 97%     Filed Weights   02/04/19 1600 02/04/19 1630 02/04/19 1941  Weight: 49 kg 47.8 kg 45.8 kg    ROS: Review of Systems  Eyes: Negative for blurred vision and double vision.  Respiratory: Negative for shortness of breath.   Cardiovascular: Negative for chest pain, palpitations and orthopnea.  Gastrointestinal: Negative for abdominal pain.  Genitourinary: Negative for dysuria and urgency.  Musculoskeletal: Negative for myalgias and neck pain.  Neurological: Negative for dizziness, speech change and headaches.  Endo/Heme/Allergies: Does not bruise/bleed easily.  Psychiatric/Behavioral: Negative for depression.   Exam: Physical Exam  HENT:  Nose: No mucosal edema.  Mouth/Throat: No oropharyngeal exudate or posterior oropharyngeal edema.  Eyes: Pupils are equal, round, and reactive to light. Conjunctivae, EOM and lids are normal.  Neck: No JVD present. Carotid bruit is not present. No edema present. No thyroid mass and no thyromegaly present.  Cardiovascular: S1 normal and S2 normal. Exam reveals no gallop.  No murmur heard. Pulses:      Dorsalis pedis pulses are 2+ on the right side and 2+ on the left side.  Respiratory: No respiratory distress. He has decreased breath sounds in the right lower field and the left lower field. He has no wheezes. He has no rhonchi. He has rales in the right lower field and the left lower field.  GI: Soft. Bowel sounds are normal. There is no abdominal tenderness.  Musculoskeletal:     Right ankle: He exhibits no swelling.     Left  ankle: He exhibits no swelling.  Lymphadenopathy:    He has no cervical adenopathy.  Neurological: He is alert.  Able to squeeze hands to commands.  Skin: Skin is warm. No rash noted. Nails show no clubbing.  Psychiatric:  Follows commands.      Data Reviewed: Basic Metabolic Panel: Recent Labs  Lab 02/04/19 1237 02/04/19 1526 02/04/19 1619 02/05/19 0251 02/06/19 0514 02/07/19 0317  NA 142  --   --  140 141 142  K 3.8  --   --  4.1 3.9 3.6  CL 106  --   --  97* 99 101  CO2 18*  --   --  23 24 24   GLUCOSE 158*  --   --  123* 92 87  BUN 73*  --   --  44* 73* 43*  CREATININE 9.19* 9.41*  --  5.84* 7.76* 5.89*  CALCIUM 8.3*  --   --  8.5* 7.7* 7.9*  PHOS  --   --  6.7*  --   --  6.9*   Liver Function Tests: Recent Labs  Lab 02/04/19 1237  AST 31  ALT 12  ALKPHOS 82  BILITOT 1.3*  PROT 7.5  ALBUMIN 3.4*   CBC: Recent Labs  Lab 02/04/19 1237 02/04/19 1526 02/05/19 0251 02/06/19 0514 02/07/19 0317  WBC 14.4* 10.7* 6.2 6.1 6.7  NEUTROABS 11.8*  --   --   --   --   HGB 10.8* 9.4* 9.2* 8.2* 9.5*  HCT 34.0* 30.2* 29.1* 25.8* 29.6*  MCV 98.3 98.7 97.7 96.6 94.3  PLT 285 182 140* 166 206   Cardiac Enzymes: Recent Labs  Lab 02/04/19 1237  TROPONINI 0.07*   BNP (last 3 results) Recent Labs    12/24/18 2355  BNP 2,318.1*     CBG: Recent Labs  Lab 02/04/19 1606  GLUCAP 183*    Recent Results (from the past 240 hour(s))  Blood culture (routine x 2)     Status: Abnormal   Collection Time: 02/04/19 12:19 PM  Result Value Ref Range Status   Specimen Description   Final    BLOOD RIGHT ANTECUBITAL Performed at [facility], [address], [place], [place] [address]    Special Requests   Final    BOTTLES DRAWN AEROBIC AND ANAEROBIC Blood Culture adequate volume Performed at [facility], [address] [place], [place] [address]    Culture  Setup Time   Final    GRAM POSITIVE COCCI IN BOTH AEROBIC AND ANAEROBIC  BOTTLES CRITICAL RESULT CALLED TO, READ BACK BY AND VERIFIED WITH: [facility] DUNCAN AT 4650 02/05/2019 [place] Performed at [facility], [address] [place], [place] [address]    Culture STAPHYLOCOCCUS EPIDERMIDIS (A)  Final   Report Status 02/08/2019 FINAL  Final   Organism ID, Bacteria STAPHYLOCOCCUS EPIDERMIDIS  Final      Susceptibility   Staphylococcus epidermidis - MIC*    CIPROFLOXACIN >=8 RESISTANT Resistant     ERYTHROMYCIN >=8 RESISTANT Resistant     GENTAMICIN 8 INTERMEDIATE Intermediate     OXACILLIN >=4 RESISTANT Resistant     TETRACYCLINE 2 SENSITIVE Sensitive     VANCOMYCIN 2 SENSITIVE Sensitive     TRIMETH/SULFA 160 RESISTANT Resistant     CLINDAMYCIN >=8 RESISTANT Resistant     RIFAMPIN <=0.5 SENSITIVE Sensitive     Inducible Clindamycin NEGATIVE Sensitive     * STAPHYLOCOCCUS EPIDERMIDIS  Blood Culture ID Panel (Reflexed)     Status: Abnormal   Collection Time: 02/04/19 12:19 PM  Result Value Ref Range Status   Enterococcus species NOT DETECTED NOT DETECTED Final   Listeria monocytogenes NOT DETECTED NOT DETECTED Final   Staphylococcus species DETECTED (A) NOT DETECTED Final    Comment: Methicillin (oxacillin) resistant coagulase negative staphylococcus. Possible blood culture contaminant (unless isolated from more than one blood culture draw or clinical case suggests pathogenicity). No antibiotic treatment is indicated for blood  culture contaminants. CRITICAL RESULT CALLED TO, READ BACK BY AND VERIFIED WITH:  [facility] DUNCAN AT 6812 02/05/2019 SDR    Staphylococcus aureus (BCID) NOT DETECTED NOT DETECTED Final   Methicillin resistance DETECTED (A) NOT DETECTED Final    Comment: CRITICAL RESULT CALLED TO, READ BACK BY AND VERIFIED WITH:  [facility] DUNCAN AT 0715 02/05/2019 SDR    Streptococcus species NOT DETECTED NOT DETECTED Final   Streptococcus agalactiae NOT DETECTED NOT DETECTED Final   Streptococcus pneumoniae NOT DETECTED NOT DETECTED Final    Streptococcus pyogenes NOT DETECTED NOT DETECTED Final   Acinetobacter baumannii NOT DETECTED NOT DETECTED Final   Enterobacteriaceae species NOT DETECTED NOT DETECTED Final   Enterobacter cloacae complex NOT DETECTED NOT DETECTED Final   Escherichia coli NOT DETECTED NOT DETECTED Final   Klebsiella oxytoca NOT DETECTED NOT DETECTED Final   Klebsiella pneumoniae NOT DETECTED NOT DETECTED Final   Proteus species NOT DETECTED NOT DETECTED Final   Serratia marcescens NOT DETECTED NOT DETECTED Final   Haemophilus influenzae NOT DETECTED NOT DETECTED Final   Neisseria meningitidis NOT DETECTED NOT DETECTED Final  Pseudomonas aeruginosa NOT DETECTED NOT DETECTED Final   Candida albicans NOT DETECTED NOT DETECTED Final   Candida glabrata NOT DETECTED NOT DETECTED Final   Candida krusei NOT DETECTED NOT DETECTED Final   Candida parapsilosis NOT DETECTED NOT DETECTED Final   Candida tropicalis NOT DETECTED NOT DETECTED Final    Comment: Performed at [facility], [address] [place], [place] [address]  SARS Coronavirus 2 [facility] order, Performed in [place] hospital lab)     Status: None   Collection Time: 02/04/19 12:20 PM  Result Value Ref Range Status   SARS Coronavirus 2 NEGATIVE NEGATIVE Final    Comment: (NOTE) If result is NEGATIVE SARS-CoV-2 target nucleic acids are NOT DETECTED. The SARS-CoV-2 RNA is generally detectable in upper and lower  respiratory specimens during the acute phase of infection. The lowest  concentration of SARS-CoV-2 viral copies this assay can detect is 250  copies / mL. A negative result does not preclude SARS-CoV-2 infection  and should not be used as the sole basis for treatment or other  patient management decisions.  A negative result may occur with  improper specimen collection / handling, submission of specimen other  than nasopharyngeal swab, presence of viral mutation(s) within the  areas targeted by this assay, and inadequate number  of viral copies  (<250 copies / mL). A negative result must be combined with clinical  observations, patient history, and epidemiological information. If result is POSITIVE SARS-CoV-2 target nucleic acids are DETECTED. The SARS-CoV-2 RNA is generally detectable in upper and lower  respiratory specimens dur ing the acute phase of infection.  Positive  results are indicative of active infection with SARS-CoV-2.  Clinical  correlation with patient history and other diagnostic information is  necessary to determine patient infection status.  Positive results do  not rule out bacterial infection or co-infection with other viruses. If result is PRESUMPTIVE POSTIVE SARS-CoV-2 nucleic acids MAY BE PRESENT.   A presumptive positive result was obtained on the submitted specimen  and confirmed on repeat testing.  While 2019 novel coronavirus  (SARS-CoV-2) nucleic acids may be present in the submitted sample  additional confirmatory testing may be necessary for epidemiological  and / or clinical management purposes  to differentiate between  SARS-CoV-2 and other Sarbecovirus currently known to infect humans.  If clinically indicated additional testing with an alternate test  methodology [id number]) is advised. The SARS-CoV-2 RNA is generally  detectable in upper and lower respiratory sp ecimens during the acute  phase of infection. The expected result is Negative. Fact Sheet for Patients:  StrictlyIdeas.no Fact Sheet for Healthcare Providers: BankingDealers.co.za This test is not yet approved or cleared by the [place] FDA and has been authorized for detection and/or diagnosis of SARS-CoV-2 by FDA under an Emergency Use Authorization (EUA).  This EUA will remain in effect (meaning this test can be used) for the duration of the COVID-19 declaration under Section 564(b)(1) of the Act, 21 U.S.C. section 360bbb-3(b)(1), unless the authorization is  terminated or revoked sooner. Performed at [facility], [place] [address], [place], [place] [address]   Blood culture (routine x 2)     Status: Abnormal   Collection Time: 02/04/19 12:24 PM  Result Value Ref Range Status   Specimen Description   Final    BLOOD RIGHT ANTECUBITAL Performed at [facility], [address] [place], [place] [address]    Special Requests   Final    BOTTLES DRAWN AEROBIC AND ANAEROBIC Blood  Culture adequate volume Performed at [facility], [address] [place], [place] [address]    Culture  Setup Time   Final    GRAM POSITIVE COCCI AEROBIC BOTTLE ONLY CRITICAL VALUE NOTED.  VALUE IS CONSISTENT WITH PREVIOUSLY REPORTED AND CALLED VALUE. Performed at [facility], [address] [place], [place] [address]    Culture (A)  Final    STAPHYLOCOCCUS EPIDERMIDIS SUSCEPTIBILITIES PERFORMED ON PREVIOUS CULTURE WITHIN THE LAST 5 DAYS. Performed at [facility], [place] [address] [place], [place] [address]    Report Status 02/07/2019 FINAL  Final  MRSA PCR Screening     Status: None   Collection Time: 02/04/19  4:51 PM  Result Value Ref Range Status   MRSA by PCR NEGATIVE NEGATIVE Final    Comment:        The GeneXpert MRSA Assay (FDA approved for NASAL specimens only), is one component of a comprehensive MRSA colonization surveillance program. It is not intended to diagnose MRSA infection nor to guide or monitor treatment for MRSA infections. Performed at [facility], [address] [place], [place] [address]   CULTURE, BLOOD (ROUTINE X 2) w Reflex to ID Panel     Status: None (Preliminary result)   Collection Time: 02/05/19  9:39 PM  Result Value Ref Range Status   Specimen Description BLOOD BLOOD RIGHT FOREARM  Final   Special Requests   Final    BOTTLES DRAWN AEROBIC AND ANAEROBIC Blood Culture adequate volume   Culture   Final    NO GROWTH 3 DAYS Performed at [facility], [address], [place], [place] [address]    Report Status PENDING  Incomplete  CULTURE, BLOOD (ROUTINE X 2) w Reflex to ID Panel     Status: None (Preliminary result)   Collection Time: 02/05/19  9:46 PM  Result Value Ref Range Status   Specimen Description BLOOD CATH SITE  Final   Special Requests   Final    BOTTLES DRAWN AEROBIC AND ANAEROBIC Blood Culture adequate volume   Culture   Final    NO GROWTH 3 DAYS Performed at [facility], [address], [place], [place] [address]    Report Status PENDING  Incomplete  Cath Tip Culture     Status: None (Preliminary result)   Collection Time: 02/06/19  2:29 PM  Result Value Ref Range Status   Specimen Description   Final    CATH TIP Performed at [facility], [address], [place], [place] [address]    Special Requests   Final    NONE Performed at [facility], [address] [place], [place] [address]    Culture   Final    NO GROWTH < 24 HOURS Performed at [facility], [place] [address] [place], [place] [address]    Report Status PENDING  Incomplete     Studies: No results found.  Scheduled Meds: . amLODipine  5 mg Oral Q2000  . apixaban  2.5 mg Oral BID  . aspirin EC  81 mg Oral Daily  . atorvastatin  80 mg Oral QHS  . carvedilol  25 mg Oral BID WC  . Chlorhexidine Gluconate Cloth  6 each Topical Daily  . escitalopram  10 mg Oral Daily  . gabapentin  100 mg Oral TID  . hydrALAZINE  25 mg Oral Q8H  . multivitamin  1 tablet Oral Daily  . pantoprazole (PROTONIX) IV  40 mg Intravenous Q24H  .  sevelamer carbonate  1,600 mg Oral TID WC  . sodium chloride flush  3 mL Intravenous Q12H  . vancomycin variable dose per unstable renal function (pharmacist dosing)   Does not apply See admin instructions   Continuous Infusions: . sodium chloride    . sodium chloride      Assessment/Plan:  1. Acute hypoxic respiratory failure.  Now extubated 2. Acute pulmonary edema secondary to fluid  overload.  Acute diastolic congestive heart failure.  Improved 3. Systemic inflammatory response.  Blood culture positive for gram-positive cocci due to staph epidermidis, which is a contaminant discontinue IV antibiotics 4. End-stage renal disease.  PermCath removed fistula will be used 5. Anemia of chronic disease 6. History of hypertension.  Hypotensive on presentation.  Continue to monitor off medication  Code Status:     Code Status Orders  (From admission, onward)         Start     Ordered   02/04/19 1342  Full code  Continuous     02/04/19 1343        Code Status History    Date Active Date Inactive Code Status Order ID Comments User Context   01/27/2019 0654 01/29/2019 2049 Partial Code [id number]  Harrie Foreman, MD Inpatient   01/07/2019 2156 01/27/2019 0638 Partial Code [id number]  Tennis Ship, MD Inpatient   12/25/2018 0158 01/07/2019 2027 Partial Code [id number]  Ina Homes, MD ED     Family Communication: As per critical care specialist Disposition Plan: To be determined  Consultants:  Critical care specialist  Nephrology  Antibiotics:  Vancomycin  Time spent: 35 minutes.  Case discussed with critical care specialist.  [facility]

## 2019-02-08 NOTE — TOC Progression Note (Addendum)
Transition of Care [facility]) - Progression Note    Patient Details  Name: Brett Small MRN: [id number] Date of Birth: [birth year]  Transition of Care [facility]) CM/SW [place], RN Phone Number: 02/08/2019, 10:57 AM  Clinical Narrative:  MD, bedside RN and patient all requesting to speak with [facility] team about disposition. Patient very anxious about being in the hospital and is worried about his stuff that is still at [facility], he is requesting someone bring him his phone from there. I have provided him the number to [facility] front desk so he may inquire about his things. Patient tells me he has "a couple days worth of money" to continue paying for a room. It is my understanding that patient is likely not eligible to go to [facility] shelter [facility] due to their admission restrictions related to [place] but I will confirm this with social work. It appears the only disposition available right now is to return to the [facility], patient does not have supports in the area or any other places to stay. Patient was started at [facility] on [address] in [place] during previous admission. This location is the closest to his hotel room. Patients understanding is that he is to call a cab and arrange transport to HD every TTS. Upon discharge last time he called a cab to get him to HD but they were late and we was unable to get dialysis. He has talked to "a bus services" about arranging pre scheduled transport but apparently was told "it takes weeks to get set up". Will leave a message for Dell Ponto, who is the liaison with patient pathways to assist [place] with these particulars. Patient was following in [place] for his medications and tells me he has a couple months worth of medications already pre filled so he plans to find a PCP soon and will obtain refill scripts from them. TOC team will continue to follow to assist with disposition.   Update 14:00- spoke with Estill Bamberg from patient pathways. Per  Estill Bamberg transportation was an issue last hospitalization and there was not a scheduled transportation and the patient had agreed to try and use a cab to get to his HD 3x per week. As this plan did not work Event organiser recommended trying to use [employer]. This RNCM does not have this contact. Will consult with weekday team tomorrow to assist with this.        Expected Discharge Plan: Home/Self Care(poss homeless shelter?) Barriers to Discharge: Continued Medical Work up  Expected Discharge Plan and Services Expected Discharge Plan: Home/Self Care(poss homeless shelter?)                                   Social Determinants of Health (SDOH) Interventions    Readmission Risk Interventions No flowsheet data found.

## 2019-02-09 ENCOUNTER — Encounter: Payer: Self-pay | Admitting: Vascular Surgery

## 2019-02-09 LAB — CATH TIP CULTURE: Culture: NO GROWTH

## 2019-02-09 MED ORDER — EPOETIN ALFA 10000 UNIT/ML IJ SOLN
4000.0000 [IU] | Freq: Once | INTRAMUSCULAR | Status: AC
  Administered 2019-02-09: 4000 [IU] via INTRAVENOUS

## 2019-02-09 MED ORDER — LIDOCAINE-PRILOCAINE 2.5-2.5 % EX CREA
TOPICAL_CREAM | Freq: Every day | CUTANEOUS | Status: DC
  Filled 2019-02-09: qty 5

## 2019-02-09 NOTE — Progress Notes (Signed)
Hemodialysis treatment completed at 1352. Net UF removed 2L. Blood rinsed back, needles removed one at a time and manual pressure held to AVG until hemostasis achieved. No active bleeding upon patient departure from acute room. Post hemodialysis report given to J.Alba Destine, RN.    02/09/19 1352  Hand-Off documentation  Report given to (Full Name) [name], RN  Vital Signs  Pulse Rate 86  Resp 15  BP (!) 160/87  BP Location Right Arm  BP Method Automatic  Patient Position (if appropriate) Lying  During Hemodialysis Assessment  Intra-Hemodialysis Comments Tx completed  Post-Hemodialysis Assessment  Rinseback Volume (mL) 250 mL  KECN 57.2 V  Dialyzer Clearance Lightly streaked  Duration of HD Treatment -hour(s) 4 hour(s)  Hemodialysis Intake (mL) 500 mL  UF Total -Machine (mL) 2500 mL  Net UF (mL) 2000 mL  Tolerated HD Treatment Yes  AVG/AVF Arterial Site Held (minutes) 5 minutes  AVG/AVF Venous Site Held (minutes) 5 minutes  Education / Care Plan  Dialysis Education Provided Yes  Documented Education in Care Plan Yes  Outpatient Plan of Care Reviewed and on Chart Yes  Fistula / Graft Left Upper arm Arteriovenous vein graft  Placement Date/Time: 12/26/18 1024   Placed prior to admission: No  Orientation: Left  Access Location: Upper arm  Access Type: (c) Arteriovenous vein graft  Site Condition No complications  Fistula / Graft Assessment Present;[place]rill;Bruit  Status Deaccessed  Needle Size 17  Drainage Description None

## 2019-02-09 NOTE — Discharge Summary (Signed)
[employer] [facility] [place] at [facility], [place] y.o., DOB [birth year], MRN [id number]. Admission date: 02/04/2019 Discharge Date 02/09/2019 Primary MD Patient, [name] Admitting Physician Saundra Shelling, MD  Admission Diagnosis  Acute pulmonary edema ([place]) [J81.0] Demand ischemia ([place]) [I24.8] Acute respiratory failure with hypoxia ([place]) [J96.01]  Discharge Diagnosis   Active Problems: Acute hypoxic respiratory failure due to acute pulmonary edema secondary to fluid overload Systemic inflammatory response on admission no evidence of infection this was related to patient's respiratory failure End-stage renal disease with missing hemodialysis Positive blood cultures due to staph epidermidis skin contamination Anemia of chronic disease History of hypertension       Hospital Course Brett Small  is a 43 y.o. male with a known history of end-stage renal disease on dialysis, hypertension missed dialysis for last 2 sessions.  Patient presented to the emergency room with increased shortness of breath unable to speak full sentences and oxygen saturation was 60%.  He was initially put on nonrebreather mask 100%.  Patient was electively intubated by ER physician put on ventilator.  Coronavirus test was also sent.  Patient put on airborne and contact precautions.  Nephrology consultant was informed for dialysis.  Case was also discussed with intensivist.  Patient also has elevated blood pressure initially when he came to the emergency room systolic blood pressure was more than 220 mmHg.  Initially there was some concern that patient may have sepsis.  Blood cultures were positive subsequent final results showed staph epidermidis.  Patient also had a Port-A-Cath that was removed.  Patient has missed dialysis previously stating that he did not have a way to get to dialysis center.  After receiving multiple dialysis his respiratory status is now resolved.  He is back to baseline.   Patient has been asymptomatic.  Case manager to give him information regarding transportation           Consults  vascular surgery  Significant Tests:  See full reports for all details     Dg Chest Port 1 View  Result Date: 02/06/2019 CLINICAL DATA:  Acute respiratory failure EXAM: PORTABLE CHEST 1 VIEW COMPARISON:  02/05/2019 FINDINGS: Cardiac shadow is stable. Endotracheal tube, gastric catheter and dialysis catheter are again noted and stable. Vascular stenting in the left axillary region is noted. The lungs are well aerated with persistent bibasilar infiltrates although significantly improved when compared with the prior exam. No bony abnormality is noted. IMPRESSION: Improved aeration in the bases bilaterally. Electronically Signed   By: Inez Catalina M.D.   On: 02/06/2019 03:27   Dg Chest Port 1 View  Result Date: 02/05/2019 CLINICAL DATA:  Acute respiratory failure. EXAM: PORTABLE CHEST 1 VIEW COMPARISON:  One-view chest x-ray 02/04/2019 FINDINGS: Heart is mildly enlarged. Progressive interstitial and airspace disease is present with increasing consolidation at both lung bases. The endotracheal tube terminates 3 cm above the carina. NG tube terminates in the stomach. Right IJ dialysis catheter is stable. IMPRESSION: 1. Progressive interstitial and airspace disease with increased consolidation of both lung bases. 2. Support apparatus is stable. Electronically Signed   By: San Morelle M.D.   On: 02/05/2019 06:00   Dg Chest Portable 1 View  Result Date: 02/04/2019 CLINICAL DATA:  43 year old male with shortness of breath EXAM: PORTABLE CHEST 1 VIEW COMPARISON:  01/27/2019, 12/24/2010 FINDINGS: Cardiomediastinal silhouette unchanged. Similar appearance of mixed interstitial and airspace opacities throughout the lungs with mild thickening of the minor fissure. No large pleural effusion. No pneumothorax. Right  IJ hemodialysis catheter is unchanged. Endotracheal tube terminates 5.8  cm above the carina. Gastric tube terminates within the stomach. Stent graft of the left axillary vein/brachial vein IMPRESSION: Mixed interstitial and airspace opacities with the differential primarily including multifocal pneumonia (including atypical pathogens), and edema. Endotracheal tube terminates suitably above the carina. Right IJ hemodialysis catheter terminates at the superior vena cava. Gastric tube terminates in the stomach. Electronically Signed   By: Corrie Mckusick D.O.   On: 02/04/2019 13:44   Dg Chest Port 1 View  Result Date: 01/27/2019 CLINICAL DATA:  Cough.  Hypertension EXAM: PORTABLE CHEST 1 VIEW COMPARISON:  12/24/2018 FINDINGS: Symmetric perihilar interstitial and airspace opacity. No effusion or pneumothorax. Borderline heart size. Dialysis catheter on the right with tip at the SVC. Venous stenting at the left axilla. IMPRESSION: Symmetric perihilar airspace disease favoring edema. Electronically Signed   By: Monte Fantasia M.D.   On: 01/27/2019 07:17       Today   Subjective:   Brett Small patient denies any symptoms he is doing well Objective:   Blood pressure (!) 147/74, pulse 64, temperature 98.2 F (36.8 C), temperature source Oral, resp. rate 14, height 5\' 2"  (1.575 m), weight 45.8 kg, SpO2 95 %.  .  Intake/Output Summary (Last 24 hours) at 02/09/2019 1311 Last data filed at 02/09/2019 1004 Gross per 24 hour  Intake 840 ml  Output 0 ml  Net 840 ml    Exam VITAL SIGNS: Blood pressure (!) 147/74, pulse 64, temperature 98.2 F (36.8 C), temperature source Oral, resp. rate 14, height 5\' 2"  (1.575 m), weight 45.8 kg, SpO2 95 %.  GENERAL:  43 y.o.-year-old patient lying in the bed with no acute distress.  EYES: Pupils equal, round, reactive to light and accommodation. No scleral icterus. Extraocular muscles intact.  HEENT: Head atraumatic, normocephalic. Oropharynx and nasopharynx clear.  NECK:  Supple, no jugular venous distention. No thyroid enlargement,  no tenderness.  LUNGS: Normal breath sounds bilaterally, no wheezing, rales,rhonchi or crepitation. No use of accessory muscles of respiration.  CARDIOVASCULAR: S1, S2 normal. No murmurs, rubs, or gallops.  ABDOMEN: Soft, nontender, nondistended. Bowel sounds present. No organomegaly or mass.  EXTREMITIES: No pedal edema, cyanosis, or clubbing.  NEUROLOGIC: Cranial nerves II through XII are intact. Muscle strength 5/5 in all extremities. Sensation intact. Gait not checked.  PSYCHIATRIC: The patient is alert and oriented x 3.  SKIN: No obvious rash, lesion, or ulcer.   Data Review     CBC w Diff:  Lab Results  Component Value Date   WBC 6.7 02/07/2019   HGB 9.5 (L) 02/07/2019   HCT 29.6 (L) 02/07/2019   PLT 206 02/07/2019   LYMPHOPCT 10 02/04/2019   MONOPCT 6 02/04/2019   EOSPCT 1 02/04/2019   BASOPCT 1 02/04/2019   CMP:  Lab Results  Component Value Date   NA 142 02/07/2019   K 3.6 02/07/2019   CL 101 02/07/2019   CO2 24 02/07/2019   BUN 43 (H) 02/07/2019   CREATININE 5.89 (H) 02/07/2019   PROT 7.5 02/04/2019   ALBUMIN 3.4 (L) 02/04/2019   BILITOT 1.3 (H) 02/04/2019   ALKPHOS 82 02/04/2019   AST 31 02/04/2019   ALT 12 02/04/2019  .  Micro Results Recent Results (from the past 240 hour(s))  Blood culture (routine x 2)     Status: Abnormal   Collection Time: 02/04/19 12:19 PM  Result Value Ref Range Status   Specimen Description   Final    BLOOD  RIGHT ANTECUBITAL Performed at [facility], [address] [place], [place] [address]    Special Requests   Final    BOTTLES DRAWN AEROBIC AND ANAEROBIC Blood Culture adequate volume Performed at [facility], [place], [address] [place], [place] [address]    Culture  Setup Time   Final    GRAM POSITIVE COCCI IN BOTH AEROBIC AND ANAEROBIC BOTTLES CRITICAL RESULT CALLED TO, READ BACK BY AND VERIFIED WITH: [facility] DUNCAN AT 4401 02/05/2019 [place] Performed at [facility], [address]  [place], [place] [address]    Culture STAPHYLOCOCCUS EPIDERMIDIS (A)  Final   Report Status 02/08/2019 FINAL  Final   Organism ID, Bacteria STAPHYLOCOCCUS EPIDERMIDIS  Final      Susceptibility   Staphylococcus epidermidis - MIC*    CIPROFLOXACIN >=8 RESISTANT Resistant     ERYTHROMYCIN >=8 RESISTANT Resistant     GENTAMICIN 8 INTERMEDIATE Intermediate     OXACILLIN >=4 RESISTANT Resistant     TETRACYCLINE 2 SENSITIVE Sensitive     VANCOMYCIN 2 SENSITIVE Sensitive     TRIMETH/SULFA 160 RESISTANT Resistant     CLINDAMYCIN >=8 RESISTANT Resistant     RIFAMPIN <=0.5 SENSITIVE Sensitive     Inducible Clindamycin NEGATIVE Sensitive     * STAPHYLOCOCCUS EPIDERMIDIS  Blood Culture ID Panel (Reflexed)     Status: Abnormal   Collection Time: 02/04/19 12:19 PM  Result Value Ref Range Status   Enterococcus species NOT DETECTED NOT DETECTED Final   Listeria monocytogenes NOT DETECTED NOT DETECTED Final   Staphylococcus species DETECTED (A) NOT DETECTED Final    Comment: Methicillin (oxacillin) resistant coagulase negative staphylococcus. Possible blood culture contaminant (unless isolated from more than one blood culture draw or clinical case suggests pathogenicity). No antibiotic treatment is indicated for blood  culture contaminants. CRITICAL RESULT CALLED TO, READ BACK BY AND VERIFIED WITH:  [facility] DUNCAN AT 3664 02/05/2019 SDR    Staphylococcus aureus (BCID) NOT DETECTED NOT DETECTED Final   Methicillin resistance DETECTED (A) NOT DETECTED Final    Comment: CRITICAL RESULT CALLED TO, READ BACK BY AND VERIFIED WITH:  [facility] DUNCAN AT 0715 02/05/2019 SDR    Streptococcus species NOT DETECTED NOT DETECTED Final   Streptococcus agalactiae NOT DETECTED NOT DETECTED Final   Streptococcus pneumoniae NOT DETECTED NOT DETECTED Final   Streptococcus pyogenes NOT DETECTED NOT DETECTED Final   Acinetobacter baumannii NOT DETECTED NOT DETECTED Final   Enterobacteriaceae species NOT DETECTED NOT DETECTED  Final   Enterobacter cloacae complex NOT DETECTED NOT DETECTED Final   Escherichia coli NOT DETECTED NOT DETECTED Final   Klebsiella oxytoca NOT DETECTED NOT DETECTED Final   Klebsiella pneumoniae NOT DETECTED NOT DETECTED Final   Proteus species NOT DETECTED NOT DETECTED Final   Serratia marcescens NOT DETECTED NOT DETECTED Final   Haemophilus influenzae NOT DETECTED NOT DETECTED Final   Neisseria meningitidis NOT DETECTED NOT DETECTED Final   Pseudomonas aeruginosa NOT DETECTED NOT DETECTED Final   Candida albicans NOT DETECTED NOT DETECTED Final   Candida glabrata NOT DETECTED NOT DETECTED Final   Candida krusei NOT DETECTED NOT DETECTED Final   Candida parapsilosis NOT DETECTED NOT DETECTED Final   Candida tropicalis NOT DETECTED NOT DETECTED Final    Comment: Performed at [facility], [address] [place],  [address]  SARS Coronavirus 2 [facility] order, Performed in [place] hospital lab)     Status: None   Collection Time: 02/04/19 12:20 PM  Result Value Ref Range Status  SARS Coronavirus 2 NEGATIVE NEGATIVE Final    Comment: (NOTE) If result is NEGATIVE SARS-CoV-2 target nucleic acids are NOT DETECTED. The SARS-CoV-2 RNA is generally detectable in upper and lower  respiratory specimens during the acute phase of infection. The lowest  concentration of SARS-CoV-2 viral copies this assay can detect is 250  copies / mL. A negative result does not preclude SARS-CoV-2 infection  and should not be used as the sole basis for treatment or other  patient management decisions.  A negative result may occur with  improper specimen collection / handling, submission of specimen other  than nasopharyngeal swab, presence of viral mutation(s) within the  areas targeted by this assay, and inadequate number of viral copies  (<250 copies / mL). A negative result must be combined with clinical  observations, patient history, and epidemiological information. If result is  POSITIVE SARS-CoV-2 target nucleic acids are DETECTED. The SARS-CoV-2 RNA is generally detectable in upper and lower  respiratory specimens dur ing the acute phase of infection.  Positive  results are indicative of active infection with SARS-CoV-2.  Clinical  correlation with patient history and other diagnostic information is  necessary to determine patient infection status.  Positive results do  not rule out bacterial infection or co-infection with other viruses. If result is PRESUMPTIVE POSTIVE SARS-CoV-2 nucleic acids MAY BE PRESENT.   A presumptive positive result was obtained on the submitted specimen  and confirmed on repeat testing.  While 2019 novel coronavirus  (SARS-CoV-2) nucleic acids may be present in the submitted sample  additional confirmatory testing may be necessary for epidemiological  and / or clinical management purposes  to differentiate between  SARS-CoV-2 and other Sarbecovirus currently known to infect humans.  If clinically indicated additional testing with an alternate test  methodology [phone]) is advised. The SARS-CoV-2 RNA is generally  detectable in upper and lower respiratory sp ecimens during the acute  phase of infection. The expected result is Negative. Fact Sheet for Patients:  StrictlyIdeas.no Fact Sheet for Healthcare Providers: BankingDealers.co.za This test is not yet approved or cleared by the [place] FDA and has been authorized for detection and/or diagnosis of SARS-CoV-2 by FDA under an Emergency Use Authorization (EUA).  This EUA will remain in effect (meaning this test can be used) for the duration of the COVID-19 declaration under Section 564(b)(1) of the Act, 21 U.S.C. section 360bbb-3(b)(1), unless the authorization is terminated or revoked sooner. Performed at [facility], [place] [address], [place], [place] [address]   Blood culture (routine x 2)     Status: Abnormal    Collection Time: 02/04/19 12:24 PM  Result Value Ref Range Status   Specimen Description   Final    BLOOD RIGHT ANTECUBITAL Performed at [facility], [address], [place], [place] [address]    Special Requests   Final    BOTTLES DRAWN AEROBIC AND ANAEROBIC Blood Culture adequate volume Performed at [name], [address], [place], [place] [address]    Culture  Setup Time   Final    GRAM POSITIVE COCCI AEROBIC BOTTLE ONLY CRITICAL VALUE NOTED.  VALUE IS CONSISTENT WITH PREVIOUSLY REPORTED AND CALLED VALUE. Performed at [facility], [address] [place], [place] [address]    Culture (A)  Final    STAPHYLOCOCCUS EPIDERMIDIS SUSCEPTIBILITIES PERFORMED ON PREVIOUS CULTURE WITHIN THE LAST 5 DAYS. Performed at [facility], [place] [address], [place], [place] [address]    Report Status 02/07/2019 FINAL  Final  MRSA PCR Screening     Status: None   Collection Time: 02/04/19  4:51 PM  Result Value Ref Range Status   MRSA by PCR NEGATIVE NEGATIVE Final    Comment:        The GeneXpert MRSA Assay (FDA approved for NASAL specimens only), is one component of a comprehensive MRSA colonization surveillance program. It is not intended to diagnose MRSA infection nor to guide or monitor treatment for MRSA infections. Performed at [facility], [address] [place], [place] [address]   CULTURE, BLOOD (ROUTINE X 2) w Reflex to ID Panel     Status: None (Preliminary result)   Collection Time: 02/05/19  9:39 PM  Result Value Ref Range Status   Specimen Description BLOOD BLOOD RIGHT FOREARM  Final   Special Requests   Final    BOTTLES DRAWN AEROBIC AND ANAEROBIC Blood Culture adequate volume   Culture   Final    NO GROWTH 4 DAYS Performed at [facility], [address] [place] [address], [place], [place] [address]    Report Status PENDING  Incomplete  CULTURE, BLOOD (ROUTINE X 2) w Reflex to ID Panel     Status: None (Preliminary result)    Collection Time: 02/05/19  9:46 PM  Result Value Ref Range Status   Specimen Description BLOOD CATH SITE  Final   Special Requests   Final    BOTTLES DRAWN AEROBIC AND ANAEROBIC Blood Culture adequate volume   Culture   Final    NO GROWTH 4 DAYS Performed at [facility], [address] [place], [place] [address]    Report Status PENDING  Incomplete  Cath Tip Culture     Status: None   Collection Time: 02/06/19  2:29 PM  Result Value Ref Range Status   Specimen Description   Final    CATH TIP Performed at [facility], [address] [place], [place] [address]    Special Requests   Final    NONE Performed at [facility], [address], [place], [place] [address]    Culture   Final    NO GROWTH 3 DAYS Performed at [facility], [place] [address] [place], [place] [address]    Report Status 02/09/2019 FINAL  Final        Code Status Orders  (From admission, onward)         Start     Ordered   02/04/19 1342  Full code  Continuous     02/04/19 1343        Code Status History    Date Active Date Inactive Code Status Order ID Comments User Context   01/27/2019 0654 01/29/2019 2049 Partial Code [id number]  Harrie Foreman, MD Inpatient   01/07/2019 2156 01/27/2019 0638 Partial Code [id number]  Tennis Ship, MD Inpatient   12/25/2018 0158 01/07/2019 2027 Partial Code [id number]  Ina Homes, MD ED          Follow-up Information    pcp Follow up in 1 week(s).           Discharge Medications   Allergies as of 02/09/2019      Reactions   Codeine Nausea Only   Patient questioned this (??)   Sulfa Antibiotics Hives, Nausea And Vomiting      Medication List    STOP taking these medications   cephALEXin 250 MG capsule Commonly known as:  [place] these medications   amLODipine  5 MG tablet Commonly known as:  NORVASC Take 1 tablet (5 mg total) by mouth daily at 8 pm.   apixaban 2.5 MG Tabs tablet Commonly  known as:  ELIQUIS Take 1 tablet (2.5 mg total) by mouth 2 (two) times daily.   aspirin EC 81 MG tablet Take 1 tablet (81 mg total) by mouth daily.   atorvastatin 80 MG tablet Commonly known as:  LIPITOR Take 1 tablet (80 mg total) by mouth at bedtime.   carvedilol 25 MG tablet Commonly known as:  COREG Take 1 tablet (25 mg total) by mouth 2 (two) times daily with a meal.   escitalopram 10 MG tablet Commonly known as:  LEXAPRO Take 1 tablet (10 mg total) by mouth daily.   gabapentin 100 MG capsule Commonly known as:  NEURONTIN Take 1 capsule (100 mg total) by mouth 3 (three) times daily.   hydrALAZINE 25 MG tablet Commonly known as:  APRESOLINE Take 1 tablet (25 mg total) by mouth every 8 (eight) hours.   hydrOXYzine 25 MG tablet Commonly known as:  ATARAX/VISTARIL Take 25 mg by mouth 3 (three) times daily as needed for anxiety.   multivitamin Tabs tablet Take 1 tablet by mouth daily.   neomycin-bacitracin-polymyxin Oint Commonly known as:  NEOSPORIN Apply 1 application topically 2 (two) times daily.   Oxaydo 5 MG [place]aba Generic drug:  OxyCODONE HCl (Abuse Deter) Take 5 mg by mouth every 4 (four) hours as needed (pain).   sevelamer carbonate 800 MG tablet Commonly known as:  RENVELA Take 2 tablets (1,600 mg total) by mouth 3 (three) times daily with meals.          Total Time in preparing paper work, data evaluation and todays exam - 64 minutes  Dustin Flock M.D on 02/09/2019 at [facility]  [phone]

## 2019-02-09 NOTE — Progress Notes (Signed)
Pre hemodialysis report received from Brett Oats, RN. Received patient in RDU via bed. Awake, alert and verbally responsive. No actute distress noted. + Bruit/thrill noted to LUE AVG. AVG accessed with two 17g needles with some difficulty. Three needle punctures required to gain access. Hemodialysis treatment initiated at 0949 via AVG. Plan for 4 hours hemodialysis with UF of 2L as tolerated.   02/09/19 0949  Hand-Off documentation  Report received from (Full Name) [name], RN  Vital Signs  Temp 98.2 F (36.8 C)  Temp Source Oral  Pulse Rate 74  Resp 16  BP 138/78  BP Location Right Arm  BP Method Automatic  Patient Position (if appropriate) Lying  Oxygen Therapy  SpO2 95 %  O2 Device Room Air  Pain Assessment  Pain Scale 0-10  Pain Score 0  PAINAD (Pain Assessment in Advanced Dementia)  Breathing 0  Time-Out for Hemodialysis  What Procedure? dialysis  Pt Identifiers(min of two) First/Last Name;MRN/Account#  Correct Site? Yes  Correct Side? Yes  Correct Procedure? Yes  Consents Verified? Yes  Rad Studies Available? N/A  Safety Precautions Reviewed? Yes  Engineer, civil (consulting) Number 2  Station Number 1  UF/Alarm Test Passed  Conductivity: Meter 13.8  Conductivity: Machine  13.6  pH 7.4  Reverse Osmosis main  Normal Saline Lot Number [id number]  Dialyzer Lot Number [id number]  Disposable Set Lot Number [id number]  Machine Temperature 98.6 F (37 C)  Musician and Audible Yes  Blood Lines Intact and Secured Yes  Pre Treatment Patient Checks  Vascular access used during treatment Graft  Hepatitis B Surface Antigen Results Negative  Date Hepatitis B Surface Antigen Drawn 01/24/19  Hepatitis B Surface Antibody  (suceptible)  Date Hepatitis B Surface Antibody Drawn 01/24/19  Hemodialysis Consent Verified Yes  Hemodialysis Standing Orders Initiated Yes  ECG (Telemetry) Monitor On Yes  Prime Ordered Normal Saline  Length of  DialysisTreatment -hour(s) 4 Hour(s)   Dialysis Treatment Comments 1st use of AVG  Dialyzer Elisio 17H NR  Dialysate 2K, 2.5 Ca  Dialysis Anticoagulant None  Dialysate Flow Ordered 500  Blood Flow Rate Ordered 250 mL/min  Ultrafiltration Goal 2000 Liters  During Hemodialysis Assessment  Blood Flow Rate (mL/min) 250 mL/min  Arterial Pressure (mmHg) -180 mmHg  Venous Pressure (mmHg) 160 mmHg  Transmembrane Pressure (mmHg) 60 mmHg  Ultrafiltration Rate (mL/min) 620 mL/min  Dialysate Flow Rate (mL/min) 500 ml/min  Conductivity: Machine  13.8  HD Safety Checks Performed Yes  Dialysis Fluid Bolus Normal Saline  Bolus Amount (mL) 250 mL (prime)  Intra-Hemodialysis Comments Tx initiated

## 2019-02-09 NOTE — Care Management Important Message (Signed)
Important Message  Patient Details  Name: Brett Small MRN: [id number] Date of Birth: [birth year]   Medicare Important Message Given:  Yes    Dannette Barbara 02/09/2019, 11:15 AM

## 2019-02-09 NOTE — TOC Transition Note (Signed)
Transition of Care [facility]) - CM/SW Discharge Note   Patient Details  Name: Brett Small MRN: [id number] Date of Birth: 1975/10/27  Transition of Care [facility]) CM/SW Contact:  Beverly Sessions, RN Phone Number: 02/09/2019, 3:22 PM   Clinical Narrative:    Patient to discharge today.  [name] dialysis liaison notified of discharge. Patient to return to [facility] lodge.  Patient states "I have money for a few more days there".  Patient states that he will be receiving his disability check on either the 27th or 28th of the month.  Patient again provided with [employer] shelter information.  Patient states that he has not followed up with Medicaid transportation.  RNCM notified patient that we would not be able to call on his behalf,  He will have to reach out.  RNCM again provided information on [employer] for him to utilize until he follows through with Medicaid.   Patient provided with cab voucher back to the hotel.  No new medications at discharge. Patient confirms he has all of his home medications at the hotel    Final next level of care: Home/Self Care Barriers to Discharge: Barriers Resolved   Patient Goals and CMS Choice Patient states their goals for this hospitalization and ongoing recovery are:: "ill be staying at the hotel a few more days"      Discharge Placement                       Discharge Plan and Services                          Social Determinants of Health (SDOH) Interventions     Readmission Risk Interventions Readmission Risk Prevention Plan 02/09/2019 02/09/2019  Transportation Screening - (No Data)  Medication Review Press photographer) - Complete  PCP or Specialist appointment within 3-5 days of discharge Patient refused -  PCP/Specialist Appt Not Complete comments patient to follow up at outpatient palliative.  Patient states he has not followed obtaining a PCP.  Patient declined for new appointment to be made -  [place] or [place] (No Data) -  Palliative Care Screening Not Applicable -  [place] Not Applicable -

## 2019-02-09 NOTE — Progress Notes (Signed)
Pre hemodialysis assessment.    02/09/19 0945  Neurological  Level of Consciousness Alert  Orientation Level Oriented X4  Respiratory  Respiratory Pattern Regular  Chest Assessment Chest expansion symmetrical  Bilateral Breath Sounds Clear;Diminished  Cardiac  Pulse Regular  Heart Sounds S1, S2  Jugular Venous Distention (JVD) No  ECG Monitor Yes  Cardiac Rhythm NSR  Vascular  R Radial Pulse +2  L Radial Pulse +2  Integumentary  Integumentary (WDL) X  Musculoskeletal  Musculoskeletal (WDL) X  Generalized Weakness Yes  Gastrointestinal  Bowel Sounds Assessment Active  GU Assessment  Genitourinary (WDL) X  Genitourinary Symptoms Oliguria  Psychosocial  Psychosocial (WDL) X  Patient Behaviors Cooperative  Needs Expressed Denies

## 2019-02-09 NOTE — Progress Notes (Signed)
[facility]  ROUNDING NOTE   Subjective:  Patient seen at bedside.  Patient seen during dialysis Tolerating well    HEMODIALYSIS FLOWSHEET:  Blood Flow Rate (mL/min): 250 mL/min Arterial Pressure (mmHg): -210 mmHg Venous Pressure (mmHg): 170 mmHg Transmembrane Pressure (mmHg): 70 mmHg Ultrafiltration Rate (mL/min): 620 mL/min Dialysate Flow Rate (mL/min): 500 ml/min Conductivity: Machine : 14.1 Conductivity: Machine : 14.1 Dialysis Fluid Bolus: Normal Saline Bolus Amount (mL): 250 mL(prime)      Objective:  Vital signs in last 24 hours:  Temp:  [98.1 F (36.7 C)-98.4 F (36.9 C)] 98.2 F (36.8 C) (04/20 0949) Pulse Rate:  [71-79] 74 (04/20 1115) Resp:  [12-18] 14 (04/20 1115) BP: (106-156)/(61-86) 146/84 (04/20 1115) SpO2:  [95 %-98 %] 95 % (04/20 0949)  Weight change:  Filed Weights   02/04/19 1600 02/04/19 1630 02/04/19 1941  Weight: 49 kg 47.8 kg 45.8 kg    Intake/Output: I/O last 3 completed shifts: In: 940 [P.O.:940] Out: 0    Intake/Output this shift:  Total I/O In: 480 [P.O.:480] Out: -   Physical Exam: General: Laying in bed, no acute distress  Head: Normocephalic, atraumatic.  Eyes: Anicteric  Neck: Supple  Lungs:  Room air, a, normal effort  Heart: Regular, no rubs  Abdomen:  Soft, nontender, bowel sounds present  Extremities: no peripheral edema.  Neurologic: Awake, alert, follows commands  Skin: No lesions  Access: left arm AVG +thrill and +bruit    Basic Metabolic Panel: Recent Labs  Lab 02/04/19 1237 02/04/19 1526 02/04/19 1619 02/05/19 0251 02/06/19 0514 02/07/19 0317  NA 142  --   --  140 141 142  K 3.8  --   --  4.1 3.9 3.6  CL 106  --   --  97* 99 101  CO2 18*  --   --  23 24 24   GLUCOSE 158*  --   --  123* 92 87  BUN 73*  --   --  44* 73* 43*  CREATININE 9.19* 9.41*  --  5.84* 7.76* 5.89*  CALCIUM 8.3*  --   --  8.5* 7.7* 7.9*  PHOS  --   --  6.7*  --   --  6.9*    Liver Function Tests: Recent  Labs  Lab 02/04/19 1237  AST 31  ALT 12  ALKPHOS 82  BILITOT 1.3*  PROT 7.5  ALBUMIN 3.4*   No results for input(s): LIPASE, AMYLASE in the last 168 hours. No results for input(s): AMMONIA in the last 168 hours.  CBC: Recent Labs  Lab 02/04/19 1237 02/04/19 1526 02/05/19 0251 02/06/19 0514 02/07/19 0317  WBC 14.4* 10.7* 6.2 6.1 6.7  NEUTROABS 11.8*  --   --   --   --   HGB 10.8* 9.4* 9.2* 8.2* 9.5*  HCT 34.0* 30.2* 29.1* 25.8* 29.6*  MCV 98.3 98.7 97.7 96.6 94.3  PLT 285 182 140* 166 206    Cardiac Enzymes: Recent Labs  Lab 02/04/19 1237  TROPONINI 0.07*    BNP: Invalid input(s): POCBNP  CBG: Recent Labs  Lab 02/04/19 1606  GLUCAP 183*    Microbiology: Results for orders placed or performed during the hospital encounter of 02/04/19  Blood culture (routine x 2)     Status: Abnormal   Collection Time: 02/04/19 12:19 PM  Result Value Ref Range Status   Specimen Description   Final    BLOOD RIGHT ANTECUBITAL Performed at [facility], [address] [place], [place] [address]  Special Requests   Final    BOTTLES DRAWN AEROBIC AND ANAEROBIC Blood Culture adequate volume Performed at [facility], [address] [place], [place] [address]    Culture  Setup Time   Final    GRAM POSITIVE COCCI IN BOTH AEROBIC AND ANAEROBIC BOTTLES CRITICAL RESULT CALLED TO, READ BACK BY AND VERIFIED WITH: [facility] DUNCAN AT 7782 02/05/2019 [place] Performed at [facility], [address] [place], [place] [address]    Culture STAPHYLOCOCCUS EPIDERMIDIS (A)  Final   Report Status 02/08/2019 FINAL  Final   Organism ID, Bacteria STAPHYLOCOCCUS EPIDERMIDIS  Final      Susceptibility   Staphylococcus epidermidis - MIC*    CIPROFLOXACIN >=8 RESISTANT Resistant     ERYTHROMYCIN >=8 RESISTANT Resistant     GENTAMICIN 8 INTERMEDIATE Intermediate     OXACILLIN >=4 RESISTANT Resistant     TETRACYCLINE 2 SENSITIVE Sensitive     VANCOMYCIN 2 SENSITIVE  Sensitive     TRIMETH/SULFA 160 RESISTANT Resistant     CLINDAMYCIN >=8 RESISTANT Resistant     RIFAMPIN <=0.5 SENSITIVE Sensitive     Inducible Clindamycin NEGATIVE Sensitive     * STAPHYLOCOCCUS EPIDERMIDIS  Blood Culture ID Panel (Reflexed)     Status: Abnormal   Collection Time: 02/04/19 12:19 PM  Result Value Ref Range Status   Enterococcus species NOT DETECTED NOT DETECTED Final   Listeria monocytogenes NOT DETECTED NOT DETECTED Final   Staphylococcus species DETECTED (A) NOT DETECTED Final    Comment: Methicillin (oxacillin) resistant coagulase negative staphylococcus. Possible blood culture contaminant (unless isolated from more than one blood culture draw or clinical case suggests pathogenicity). No antibiotic treatment is indicated for blood  culture contaminants. CRITICAL RESULT CALLED TO, READ BACK BY AND VERIFIED WITH:  [facility] DUNCAN AT 6144 02/05/2019 S[facility]    Staphylococcus aureus (BCID) NOT DETECTED NOT DETECTED Final   Methicillin resistance DETECTED (A) NOT DETECTED Final    Comment: CRITICAL RESULT CALLED TO, READ BACK BY AND VERIFIED WITH:  [facility] DUNCAN AT 0715 02/05/2019 SDR    Streptococcus species NOT DETECTED NOT DETECTED Final   Streptococcus agalactiae NOT DETECTED NOT DETECTED Final   Streptococcus pneumoniae NOT DETECTED NOT DETECTED Final   Streptococcus pyogenes NOT DETECTED NOT DETECTED Final   Acinetobacter baumannii NOT DETECTED NOT DETECTED Final   Enterobacteriaceae species NOT DETECTED NOT DETECTED Final   Enterobacter cloacae complex NOT DETECTED NOT DETECTED Final   Escherichia coli NOT DETECTED NOT DETECTED Final   Klebsiella oxytoca NOT DETECTED NOT DETECTED Final   Klebsiella pneumoniae NOT DETECTED NOT DETECTED Final   Proteus species NOT DETECTED NOT DETECTED Final   Serratia marcescens NOT DETECTED NOT DETECTED Final   Haemophilus influenzae NOT DETECTED NOT DETECTED Final   Neisseria meningitidis NOT DETECTED NOT DETECTED Final    Pseudomonas aeruginosa NOT DETECTED NOT DETECTED Final   Candida albicans NOT DETECTED NOT DETECTED Final   Candida glabrata NOT DETECTED NOT DETECTED Final   Candida krusei NOT DETECTED NOT DETECTED Final   Candida parapsilosis NOT DETECTED NOT DETECTED Final   Candida tropicalis NOT DETECTED NOT DETECTED Final    Comment: Performed at [facility], [address] [place], [place] [address]  SARS Coronavirus 2 [facility] order, Performed in [place] hospital lab)     Status: None   Collection Time: 02/04/19 12:20 PM  Result Value Ref Range Status   SARS Coronavirus 2 NEGATIVE NEGATIVE Final    Comment: (NOTE) If result is NEGATIVE SARS-CoV-2  target nucleic acids are NOT DETECTED. The SARS-CoV-2 RNA is generally detectable in upper and lower  respiratory specimens during the acute phase of infection. The lowest  concentration of SARS-CoV-2 viral copies this assay can detect is 250  copies / mL. A negative result does not preclude SARS-CoV-2 infection  and should not be used as the sole basis for treatment or other  patient management decisions.  A negative result may occur with  improper specimen collection / handling, submission of specimen other  than nasopharyngeal swab, presence of viral mutation(s) within the  areas targeted by this assay, and inadequate number of viral copies  (<250 copies / mL). A negative result must be combined with clinical  observations, patient history, and epidemiological information. If result is POSITIVE SARS-CoV-2 target nucleic acids are DETECTED. The SARS-CoV-2 RNA is generally detectable in upper and lower  respiratory specimens dur ing the acute phase of infection.  Positive  results are indicative of active infection with SARS-CoV-2.  Clinical  correlation with patient history and other diagnostic information is  necessary to determine patient infection status.  Positive results do  not rule out bacterial infection or co-infection  with other viruses. If result is PRESUMPTIVE POSTIVE SARS-CoV-2 nucleic acids MAY BE PRESENT.   A presumptive positive result was obtained on the submitted specimen  and confirmed on repeat testing.  While 2019 novel coronavirus  (SARS-CoV-2) nucleic acids may be present in the submitted sample  additional confirmatory testing may be necessary for epidemiological  and / or clinical management purposes  to differentiate between  SARS-CoV-2 and other Sarbecovirus currently known to infect humans.  If clinically indicated additional testing with an alternate test  methodology [phone]) is advised. The SARS-CoV-2 RNA is generally  detectable in upper and lower respiratory sp ecimens during the acute  phase of infection. The expected result is Negative. Fact Sheet for Patients:  StrictlyIdeas.no Fact Sheet for Healthcare Providers: BankingDealers.co.za This test is not yet approved or cleared by the [place] FDA and has been authorized for detection and/or diagnosis of SARS-CoV-2 by FDA under an Emergency Use Authorization (EUA).  This EUA will remain in effect (meaning this test can be used) for the duration of the COVID-19 declaration under Section 564(b)(1) of the Act, 21 U.S.C. section 360bbb-3(b)(1), unless the authorization is terminated or revoked sooner. Performed at [facility], [place] [address] [place], [place] [address]   Blood culture (routine x 2)     Status: Abnormal   Collection Time: 02/04/19 12:24 PM  Result Value Ref Range Status   Specimen Description   Final    BLOOD RIGHT ANTECUBITAL Performed at [facility], [address] [place], [place] [address]    Special Requests   Final    BOTTLES DRAWN AEROBIC AND ANAEROBIC Blood Culture adequate volume Performed at [facility], [address] [place], [place] [address]    Culture  Setup Time   Final    GRAM POSITIVE COCCI AEROBIC BOTTLE  ONLY CRITICAL VALUE NOTED.  VALUE IS CONSISTENT WITH PREVIOUSLY REPORTED AND CALLED VALUE. Performed at [facility], [address] [place], [place] [address]    Culture (A)  Final    STAPHYLOCOCCUS EPIDERMIDIS SUSCEPTIBILITIES PERFORMED ON PREVIOUS CULTURE WITHIN THE LAST 5 DAYS. Performed at [facility], [place] [address] [place], [place] [address]    Report Status 02/07/2019 FINAL  Final  MRSA PCR Screening     Status: None   Collection Time:  02/04/19  4:51 PM  Result Value Ref Range Status   MRSA by PCR NEGATIVE NEGATIVE Final    Comment:        The GeneXpert MRSA Assay (FDA approved for NASAL specimens only), is one component of a comprehensive MRSA colonization surveillance program. It is not intended to diagnose MRSA infection nor to guide or monitor treatment for MRSA infections. Performed at [facility], [address] [place], [place] [address]   CULTURE, BLOOD (ROUTINE X 2) w Reflex to ID Panel     Status: None (Preliminary result)   Collection Time: 02/05/19  9:39 PM  Result Value Ref Range Status   Specimen Description BLOOD BLOOD RIGHT FOREARM  Final   Special Requests   Final    BOTTLES DRAWN AEROBIC AND ANAEROBIC Blood Culture adequate volume   Culture   Final    NO GROWTH 4 DAYS Performed at [facility], [address], [place], [place] [address]    Report Status PENDING  Incomplete  CULTURE, BLOOD (ROUTINE X 2) w Reflex to ID Panel     Status: None (Preliminary result)   Collection Time: 02/05/19  9:46 PM  Result Value Ref Range Status   Specimen Description BLOOD CATH SITE  Final   Special Requests   Final    BOTTLES DRAWN AEROBIC AND ANAEROBIC Blood Culture adequate volume   Culture   Final    NO GROWTH 4 DAYS Performed at [facility], [address], [place], [place] [address]    Report Status PENDING  Incomplete  Cath Tip Culture     Status: None   Collection Time: 02/06/19  2:29 PM  Result  Value Ref Range Status   Specimen Description   Final    CATH TIP Performed at [facility], [address], [place], [place] [address]    Special Requests   Final    NONE Performed at [facility], [address] [place],  [address]    Culture   Final    NO GROWTH 3 DAYS Performed at [facility], [place] [address], [place],  [address]    Report Status 02/09/2019 FINAL  Final    Coagulation Studies: No results for input(s): LABPROT, INR in the last 72 hours.  Urinalysis: No results for input(s): COLORURINE, LABSPEC, PHURINE, GLUCOSEU, HGBUR, BILIRUBINUR, KETONESUR, PROTEINUR, UROBILINOGEN, NITRITE, LEUKOCYTESUR in the last 72 hours.  Invalid input(s): APPERANCEUR    Imaging: No results found.   Medications:   . sodium chloride    . sodium chloride     . amLODipine  5 mg Oral Q2000  . apixaban  2.5 mg Oral BID  . aspirin EC  81 mg Oral Daily  . atorvastatin  80 mg Oral QHS  . carvedilol  25 mg Oral BID WC  . Chlorhexidine Gluconate Cloth  6 each Topical Daily  . escitalopram  10 mg Oral Daily  . gabapentin  100 mg Oral TID  . hydrALAZINE  25 mg Oral Q8H  . lidocaine-prilocaine   Topical Q2000  . multivitamin  1 tablet Oral Daily  . pantoprazole (PROTONIX) IV  40 mg Intravenous Q24H  . sevelamer carbonate  1,600 mg Oral TID WC  . sodium chloride flush  3 mL Intravenous Q12H   sodium chloride, sodium chloride, acetaminophen **OR** acetaminophen, heparin, hydrOXYzine, lidocaine (PF), lidocaine-prilocaine, [DISCONTINUED] ondansetron **OR** ondansetron (ZOFRAN) IV, pentafluoroprop-tetrafluoroeth, phenol, sodium chloride flush  Assessment/ Plan:  [name] is a 43 y.o.  white male with end stage renal disease on hemodialysis, hypertension, depression, aortobifemoral bypass, admission for depression 3/20, now admitted with acute respiratory failure after missed dialysis.  Patient is homeless  [place]. TTS  schedule.   1.  ESRD: Patient last had dialysis on Friday.  Next dialysis treatment on Monday.  Attempt using left upper extremity AV fistula.  PermCath was removed on Friday.  Patient had coagulation negative Staphylococcus in the blood.  2.  Acute respiratory failure.  Continues to do well post extubation.  3.  Positive blood culture.  Coagulase negative Staphylococcus noted.  PermCath removed 02/06/2019.   Left upper extremity AV fistula used today  4.  Anemia of chronic kidney disease.   Epogen with HD  5.  Secondary hyperparathyroidism.  Recheck serum phosphorus on Monday.   Last phosphorus was 6.9.  Continue Renvela 2 tablets p.o. 3 times daily with meals.   LOS: 5 Brett Small 4/20/202011:21 AM

## 2019-02-09 NOTE — Progress Notes (Signed)
Discharged information has been provided. IV's Removed. Discharged paper work was provided.

## 2019-02-09 NOTE — Progress Notes (Deleted)
Hemodialysis treatment completed at 1352. Net UF removed 2L. Blood rinsed back, needles removed one at a time and manual pressure held to AVG until hemostasis achieved. No active bleeding upon patient departure from acute room. Post hemodialysis report given to J.Alba Destine, Therapist, sports.

## 2019-02-10 LAB — THC,MS,WB/SP RFX
Cannabidiol: 1.3 ng/mL
Cannabinoid Confirmation: POSITIVE
Cannabinol: NEGATIVE ng/mL
Carboxy-THC: 3.8 ng/mL
Hydroxy-THC: NEGATIVE ng/mL
Tetrahydrocannabinol(THC): NEGATIVE ng/mL

## 2019-02-10 LAB — CULTURE, BLOOD (ROUTINE X 2)
Culture: NO GROWTH
Culture: NO GROWTH
Special Requests: ADEQUATE
Special Requests: ADEQUATE

## 2019-02-11 LAB — DRUG SCREEN 10 W/CONF, SERUM
Amphetamines, IA: NEGATIVE ng/mL
Barbiturates, IA: NEGATIVE ug/mL
Benzodiazepines, IA: NEGATIVE ng/mL
Cocaine & Metabolite, IA: NEGATIVE ng/mL
Methadone, IA: NEGATIVE ng/mL
Opiates, IA: NEGATIVE ng/mL
Oxycodones, IA: NEGATIVE ng/mL
Phencyclidine, IA: NEGATIVE ng/mL
Propoxyphene, IA: NEGATIVE ng/mL
THC(Marijuana) Metabolite, IA: POSITIVE ng/mL

## 2019-02-11 LAB — OXYCODONES,MS,WB/SP RFX
Oxycocone: NEGATIVE ng/mL
Oxycodones Confirmation: NEGATIVE
Oxymorphone: NEGATIVE ng/mL

## 2019-02-21 ENCOUNTER — Inpatient Hospital Stay
Admission: EM | Admit: 2019-02-21 | Discharge: 2019-02-24 | DRG: 871 | Disposition: A | Discharge status: Home / Self Care | Payer: Medicare Other | Attending: Internal Medicine | Admitting: Internal Medicine

## 2019-02-21 ENCOUNTER — Emergency Department: Payer: Medicare Other

## 2019-02-21 DIAGNOSIS — J690 Pneumonitis due to inhalation of food and vomit: Secondary | ICD-10-CM | POA: Diagnosis present

## 2019-02-21 DIAGNOSIS — Z20828 Contact with and (suspected) exposure to other viral communicable diseases: Secondary | ICD-10-CM | POA: Diagnosis present

## 2019-02-21 DIAGNOSIS — Z9114 Patient's other noncompliance with medication regimen: Secondary | ICD-10-CM

## 2019-02-21 DIAGNOSIS — J810 Acute pulmonary edema: Secondary | ICD-10-CM

## 2019-02-21 DIAGNOSIS — Z87891 Personal history of nicotine dependence: Secondary | ICD-10-CM

## 2019-02-21 DIAGNOSIS — N250 Renal osteodystrophy: Secondary | ICD-10-CM | POA: Diagnosis present

## 2019-02-21 DIAGNOSIS — N186 End stage renal disease: Secondary | ICD-10-CM | POA: Diagnosis present

## 2019-02-21 DIAGNOSIS — F1210 Cannabis abuse, uncomplicated: Secondary | ICD-10-CM | POA: Diagnosis present

## 2019-02-21 DIAGNOSIS — D631 Anemia in chronic kidney disease: Secondary | ICD-10-CM | POA: Diagnosis present

## 2019-02-21 DIAGNOSIS — N2581 Secondary hyperparathyroidism of renal origin: Secondary | ICD-10-CM | POA: Diagnosis present

## 2019-02-21 DIAGNOSIS — I161 Hypertensive emergency: Secondary | ICD-10-CM | POA: Diagnosis present

## 2019-02-21 DIAGNOSIS — I120 Hypertensive chronic kidney disease with stage 5 chronic kidney disease or end stage renal disease: Secondary | ICD-10-CM | POA: Diagnosis present

## 2019-02-21 DIAGNOSIS — I739 Peripheral vascular disease, unspecified: Secondary | ICD-10-CM | POA: Diagnosis present

## 2019-02-21 DIAGNOSIS — Z992 Dependence on renal dialysis: Secondary | ICD-10-CM

## 2019-02-21 DIAGNOSIS — E876 Hypokalemia: Secondary | ICD-10-CM | POA: Diagnosis not present

## 2019-02-21 DIAGNOSIS — E875 Hyperkalemia: Secondary | ICD-10-CM | POA: Diagnosis not present

## 2019-02-21 DIAGNOSIS — Z7982 Long term (current) use of aspirin: Secondary | ICD-10-CM | POA: Diagnosis not present

## 2019-02-21 DIAGNOSIS — Z79899 Other long term (current) drug therapy: Secondary | ICD-10-CM | POA: Diagnosis not present

## 2019-02-21 DIAGNOSIS — R0602 Shortness of breath: Secondary | ICD-10-CM | POA: Diagnosis present

## 2019-02-21 DIAGNOSIS — Z7901 Long term (current) use of anticoagulants: Secondary | ICD-10-CM

## 2019-02-21 DIAGNOSIS — Z885 Allergy status to narcotic agent status: Secondary | ICD-10-CM | POA: Diagnosis not present

## 2019-02-21 DIAGNOSIS — A419 Sepsis, unspecified organism: Principal | ICD-10-CM | POA: Diagnosis present

## 2019-02-21 DIAGNOSIS — Z8619 Personal history of other infectious and parasitic diseases: Secondary | ICD-10-CM | POA: Diagnosis not present

## 2019-02-21 DIAGNOSIS — Z882 Allergy status to sulfonamides status: Secondary | ICD-10-CM

## 2019-02-21 DIAGNOSIS — J9601 Acute respiratory failure with hypoxia: Secondary | ICD-10-CM | POA: Diagnosis present

## 2019-02-21 LAB — RENAL FUNCTION PANEL
Albumin: 2.9 g/dL — ABNORMAL LOW (ref 3.5–5.0)
Anion gap: 14 (ref 5–15)
BUN: 57 mg/dL — ABNORMAL HIGH (ref 6–20)
CO2: 23 mmol/L (ref 22–32)
Calcium: 7.6 mg/dL — ABNORMAL LOW (ref 8.9–10.3)
Chloride: 107 mmol/L (ref 98–111)
Creatinine, Ser: 6.86 mg/dL — ABNORMAL HIGH (ref 0.61–1.24)
GFR calc Af Amer: 10 mL/min — ABNORMAL LOW (ref >60)
GFR calc non Af Amer: 9 mL/min — ABNORMAL LOW (ref >60)
Glucose, Bld: 91 mg/dL (ref 70–99)
Phosphorus: 5.3 mg/dL — ABNORMAL HIGH (ref 2.5–4.6)
Potassium: 3.5 mmol/L (ref 3.5–5.1)
Sodium: 144 mmol/L (ref 135–145)

## 2019-02-21 LAB — TROPONIN I: Troponin I: <0.03 ng/mL (ref <0.03)

## 2019-02-21 LAB — COMPREHENSIVE METABOLIC PANEL
ALT: 22 U/L (ref 0–44)
AST: 25 U/L (ref 15–41)
Albumin: 3.8 g/dL (ref 3.5–5.0)
Alkaline Phosphatase: 84 U/L (ref 38–126)
Anion gap: 17 — ABNORMAL HIGH (ref 5–15)
BUN: 50 mg/dL — ABNORMAL HIGH (ref 6–20)
CO2: 25 mmol/L (ref 22–32)
Calcium: 7.9 mg/dL — ABNORMAL LOW (ref 8.9–10.3)
Chloride: 102 mmol/L (ref 98–111)
Creatinine, Ser: 6.51 mg/dL — ABNORMAL HIGH (ref 0.61–1.24)
GFR calc Af Amer: 11 mL/min — ABNORMAL LOW (ref >60)
GFR calc non Af Amer: 9 mL/min — ABNORMAL LOW (ref >60)
Glucose, Bld: 170 mg/dL — ABNORMAL HIGH (ref 70–99)
Potassium: 4.2 mmol/L (ref 3.5–5.1)
Sodium: 144 mmol/L (ref 135–145)
Total Bilirubin: 0.8 mg/dL (ref 0.3–1.2)
Total Protein: 7.9 g/dL (ref 6.5–8.1)

## 2019-02-21 LAB — CBC WITH DIFFERENTIAL/PLATELET
Abs Immature Granulocytes: 0.08 10*3/uL — ABNORMAL HIGH (ref 0.00–0.07)
Basophils Absolute: 0.1 10*3/uL (ref 0.0–0.1)
Basophils Relative: 1 %
Eosinophils Absolute: 0.3 10*3/uL (ref 0.0–0.5)
Eosinophils Relative: 2 %
HCT: 31.8 % — ABNORMAL LOW (ref 39.0–52.0)
Hemoglobin: 10 g/dL — ABNORMAL LOW (ref 13.0–17.0)
Immature Granulocytes: 1 %
Lymphocytes Relative: 15 %
Lymphs Abs: 2.2 10*3/uL (ref 0.7–4.0)
MCH: 31.3 pg (ref 26.0–34.0)
MCHC: 31.4 g/dL (ref 30.0–36.0)
MCV: 99.7 fL (ref 80.0–100.0)
Monocytes Absolute: 0.4 10*3/uL (ref 0.1–1.0)
Monocytes Relative: 3 %
Neutro Abs: 11.5 10*3/uL — ABNORMAL HIGH (ref 1.7–7.7)
Neutrophils Relative %: 78 %
Platelets: 296 10*3/uL (ref 150–400)
RBC: 3.19 MIL/uL — ABNORMAL LOW (ref 4.22–5.81)
RDW: 15.6 % — ABNORMAL HIGH (ref 11.5–15.5)
WBC: 14.6 10*3/uL — ABNORMAL HIGH (ref 4.0–10.5)
nRBC: 0 % (ref 0.0–0.2)

## 2019-02-21 LAB — BLOOD GAS, VENOUS
Acid-base deficit: 3.2 mmol/L — ABNORMAL HIGH (ref 0.0–2.0)
Bicarbonate: 23.6 mmol/L (ref 20.0–28.0)
Delivery systems: POSITIVE
FIO2: 0.4
O2 Saturation: 42.5 %
Patient temperature: 37
RATE: 10 resp/min
pCO2, Ven: 48 mmHg (ref 44.0–60.0)
pH, Ven: 7.3 (ref 7.250–7.430)
pO2, Ven: <31 mmHg — CL (ref 32.0–45.0)

## 2019-02-21 LAB — CBC
HCT: 23.7 % — ABNORMAL LOW (ref 39.0–52.0)
Hemoglobin: 7.5 g/dL — ABNORMAL LOW (ref 13.0–17.0)
MCH: 30.6 pg (ref 26.0–34.0)
MCHC: 31.6 g/dL (ref 30.0–36.0)
MCV: 96.7 fL (ref 80.0–100.0)
Platelets: 183 10*3/uL (ref 150–400)
RBC: 2.45 MIL/uL — ABNORMAL LOW (ref 4.22–5.81)
RDW: 15.8 % — ABNORMAL HIGH (ref 11.5–15.5)
WBC: 7.9 10*3/uL (ref 4.0–10.5)
nRBC: 0 % (ref 0.0–0.2)

## 2019-02-21 LAB — SARS CORONAVIRUS 2 BY RT PCR (HOSPITAL ORDER, PERFORMED IN ~~LOC~~ HOSPITAL LAB): SARS Coronavirus 2: NEGATIVE

## 2019-02-21 LAB — PROCALCITONIN: Procalcitonin: 0.22 ng/mL

## 2019-02-21 LAB — TSH: TSH: 9.685 u[IU]/mL — ABNORMAL HIGH (ref 0.350–4.500)

## 2019-02-21 LAB — BRAIN NATRIURETIC PEPTIDE: B Natriuretic Peptide: 2865 pg/mL — ABNORMAL HIGH (ref 0.0–100.0)

## 2019-02-21 LAB — GLUCOSE, CAPILLARY: Glucose-Capillary: 90 mg/dL (ref 70–99)

## 2019-02-21 MED ORDER — SODIUM CHLORIDE 0.9 % IV SOLN: 100.0000 mL | INTRAVENOUS | Status: DC | PRN

## 2019-02-21 MED ORDER — ONDANSETRON HCL 4 MG PO TABS: 4.0000 mg | ORAL_TABLET | Freq: Four times a day (QID) | ORAL | Status: DC | PRN

## 2019-02-21 MED ORDER — DOCUSATE SODIUM 100 MG PO CAPS
100.0000 mg | ORAL_CAPSULE | Freq: Two times a day (BID) | ORAL | Status: DC
  Administered 2019-02-21 – 2019-02-24 (×5): 100 mg via ORAL
  Filled 2019-02-21 (×6): qty 1

## 2019-02-21 MED ORDER — ACETAMINOPHEN 650 MG RE SUPP: 650.0000 mg | Freq: Four times a day (QID) | RECTAL | Status: DC | PRN

## 2019-02-21 MED ORDER — HYDRALAZINE HCL 25 MG PO TABS
25.0000 mg | ORAL_TABLET | Freq: Three times a day (TID) | ORAL | Status: DC
  Administered 2019-02-21 – 2019-02-24 (×9): 25 mg via ORAL
  Filled 2019-02-21 (×8): qty 1

## 2019-02-21 MED ORDER — LABETALOL HCL 5 MG/ML IV SOLN
10.0000 mg | Freq: Once | INTRAVENOUS | Status: AC
  Administered 2019-02-21: 11:00:00 10 mg via INTRAVENOUS

## 2019-02-21 MED ORDER — LIDOCAINE-PRILOCAINE 2.5-2.5 % EX CREA
1.0000 "application " | TOPICAL_CREAM | CUTANEOUS | Status: DC | PRN
  Filled 2019-02-21: qty 5

## 2019-02-21 MED ORDER — CHLORHEXIDINE GLUCONATE CLOTH 2 % EX PADS
6.0000 | MEDICATED_PAD | Freq: Every day | CUTANEOUS | Status: DC
  Administered 2019-02-21 – 2019-02-24 (×3): 6 via TOPICAL

## 2019-02-21 MED ORDER — PENTAFLUOROPROP-TETRAFLUOROETH EX AERO
1.0000 "application " | INHALATION_SPRAY | CUTANEOUS | Status: DC | PRN
  Filled 2019-02-21: qty 30

## 2019-02-21 MED ORDER — SODIUM CHLORIDE 0.9 % IV SOLN: 1.0000 g | INTRAVENOUS | Status: DC

## 2019-02-21 MED ORDER — ACETAMINOPHEN 325 MG PO TABS
650.0000 mg | ORAL_TABLET | Freq: Four times a day (QID) | ORAL | Status: DC | PRN
  Administered 2019-02-21 – 2019-02-23 (×4): 650 mg via ORAL
  Filled 2019-02-21 (×4): qty 2

## 2019-02-21 MED ORDER — NITROGLYCERIN IN D5W 200-5 MCG/ML-% IV SOLN
INTRAVENOUS | Status: AC
  Administered 2019-02-21: 03:00:00
  Filled 2019-02-21: qty 250

## 2019-02-21 MED ORDER — ONDANSETRON HCL 4 MG/2ML IJ SOLN
4.0000 mg | Freq: Four times a day (QID) | INTRAMUSCULAR | Status: DC | PRN
  Administered 2019-02-23: 05:00:00 4 mg via INTRAVENOUS
  Filled 2019-02-21: qty 2

## 2019-02-21 MED ORDER — LIDOCAINE HCL (PF) 1 % IJ SOLN
5.0000 mL | INTRAMUSCULAR | Status: DC | PRN
  Filled 2019-02-21: qty 5

## 2019-02-21 MED ORDER — SEVELAMER CARBONATE 800 MG PO TABS
1600.0000 mg | ORAL_TABLET | Freq: Three times a day (TID) | ORAL | Status: DC
  Administered 2019-02-21 – 2019-02-24 (×6): 1600 mg via ORAL
  Filled 2019-02-21 (×6): qty 2

## 2019-02-21 MED ORDER — SODIUM CHLORIDE 0.9 % IV SOLN
2.0000 g | INTRAVENOUS | Status: DC
  Filled 2019-02-21: qty 2

## 2019-02-21 MED ORDER — AZITHROMYCIN 250 MG PO TABS
500.0000 mg | ORAL_TABLET | Freq: Every day | ORAL | Status: DC
  Administered 2019-02-21 – 2019-02-24 (×4): 500 mg via ORAL
  Filled 2019-02-21 (×2): qty 2
  Filled 2019-02-21 (×2): qty 1

## 2019-02-21 MED ORDER — ALTEPLASE 2 MG IJ SOLR: 2.0000 mg | Freq: Once | INTRAMUSCULAR | Status: DC | PRN

## 2019-02-21 MED ORDER — METOPROLOL TARTRATE 5 MG/5ML IV SOLN
INTRAVENOUS | Status: AC
  Administered 2019-02-21: 03:00:00 2 mg
  Administered 2019-02-21: 03:00:00 3 mg
  Filled 2019-02-21: qty 5

## 2019-02-21 MED ORDER — LABETALOL HCL 5 MG/ML IV SOLN
INTRAVENOUS | Status: AC
  Administered 2019-02-21: 11:00:00 10 mg via INTRAVENOUS
  Filled 2019-02-21: qty 4

## 2019-02-21 MED ORDER — SODIUM CHLORIDE 0.9 % IV SOLN
2.0000 g | Freq: Once | INTRAVENOUS | Status: AC
  Administered 2019-02-21: 2 g via INTRAVENOUS
  Filled 2019-02-21: qty 2

## 2019-02-21 MED ORDER — HEPARIN SODIUM (PORCINE) 1000 UNIT/ML DIALYSIS
1000.0000 [IU] | INTRAMUSCULAR | Status: DC | PRN
  Filled 2019-02-21: qty 1

## 2019-02-21 NOTE — Progress Notes (Signed)
Leg cramps. Uf off for now. [employer] lowered to 256ml. I have never been able to pull more than 2kg off Brett Small in the past.

## 2019-02-21 NOTE — Progress Notes (Signed)
Patient admitted this morning for shortness of breath secondary to flash pulmonary edema., known history of end-stage renal disease, hypertension-noncompliant with his blood pressure medications.  Came in with shortness of breath.  Started on BiPAP for hypoxia and is admitted to stepdown unit.  Dialysis at bedside. -Restart home blood pressure medications to improve his blood pressure. -COVID test is negative -Patient is Tuesday, Thursday and Saturday hemodialysis patient.  Dialysis today.  Discussed with nephrologist. -Agree with current care and plan

## 2019-02-21 NOTE — H&P (Addendum)
Brett Small is an 43 y.o. male.   Chief Complaint: Shortness of breath HPI: The patient with past medical history of hypertension and ESRD presents to the emergency department complaining of shortness of breath.  The patient reports that his shortness of breath began acutely this afternoon and rapidly worsened.  He called EMS who placed him on a nonrebreather.  The patient denies chest pain.  Chest x-ray in the emergency department showed interstitial edema.  His blood pressure was also greater than 200/100 upon arrival.  The patient was given metoprolol and placed on BiPAP prior to the emergency department staff calling the hospitalist service for admission.  Past Medical History:  Diagnosis Date  . Hypertension   . Renal disorder     Past Surgical History:  Procedure Laterality Date  . A/V SHUNT INTERVENTION Left 01/19/2019   Procedure: LEFT UPPER EXTREMITY A/V SHUNTOGRAM / UPPER EXTREMITY ANGIOGRAM;  Surgeon: Algernon Huxley, MD;  Location: [facility];  Service: Cardiovascular;  Laterality: Left;  . AORTA - FEMORAL ARTERY BYPASS GRAFT    . AV FISTULA PLACEMENT Left 12/26/2018   Procedure: INSERTION OF GORE STRETCH VASCULAR 4-7MM X  45CM IN LEFT UPPER ARM;  Surgeon: Marty Heck, MD;  Location: [facility];  Service: Vascular;  Laterality: Left;  . DIALYSIS/PERMA CATHETER REMOVAL N/A 02/06/2019   Procedure: DIALYSIS/PERMA CATHETER REMOVAL;  Surgeon: Algernon Huxley, MD;  Location: [facility];  Service: Cardiovascular;  Laterality: N/A;    Family History  Problem Relation Age of Onset  . Hypertension Other   . Diabetes Other   . Clotting disorder Father    Social History:  reports that he quit smoking about 7 months ago. He has never used smokeless tobacco. He reports previous alcohol use. He reports that he does not use drugs.  Allergies:  Allergies  Allergen Reactions  . Codeine Nausea Only    Patient questioned this (??)  . Sulfa Antibiotics Hives and Nausea And  Vomiting    Prior to Admission medications   Medication Sig Start Date End Date Taking? Authorizing Provider  amLODipine (NORVASC) 5 MG tablet Take 1 tablet (5 mg total) by mouth daily at 8 pm. 01/26/19   Clapacs, Madie Reno, MD  apixaban (ELIQUIS) 2.5 MG TABS tablet Take 1 tablet (2.5 mg total) by mouth 2 (two) times daily. 01/26/19   Clapacs, Madie Reno, MD  aspirin EC 81 MG tablet Take 1 tablet (81 mg total) by mouth daily. 01/26/19   Clapacs, Madie Reno, MD  atorvastatin (LIPITOR) 80 MG tablet Take 1 tablet (80 mg total) by mouth at bedtime. 01/26/19   Clapacs, Madie Reno, MD  carvedilol (COREG) 25 MG tablet Take 1 tablet (25 mg total) by mouth 2 (two) times daily with a meal. 01/26/19   Clapacs, Madie Reno, MD  gabapentin (NEURONTIN) 100 MG capsule Take 1 capsule (100 mg total) by mouth 3 (three) times daily. 01/26/19   Clapacs, Madie Reno, MD  hydrALAZINE (APRESOLINE) 25 MG tablet Take 1 tablet (25 mg total) by mouth every 8 (eight) hours. 01/26/19   Clapacs, Madie Reno, MD  hydrOXYzine (ATARAX/VISTARIL) 25 MG tablet Take 25 mg by mouth 3 (three) times daily as needed for anxiety.    [provider]  multivitamin (RENA-VIT) TABS tablet Take 1 tablet by mouth daily. 01/27/19   Clapacs, Madie Reno, MD  neomycin-bacitracin-polymyxin (NEOSPORIN) OINT Apply 1 application topically 2 (two) times daily. 01/26/19   Clapacs, Madie Reno, MD  OxyCODONE HCl, Abuse  Deter, (OXAYDO) 5 MG TABA Take 5 mg by mouth every 4 (four) hours as needed (pain).    [provider]  sevelamer carbonate (RENVELA) 800 MG tablet Take 2 tablets (1,600 mg total) by mouth 3 (three) times daily with meals. 01/26/19   Clapacs, Madie Reno, MD     Results for orders placed or performed during the hospital encounter of 02/21/19 (from the past 48 hour(s))  SARS Coronavirus 2 [facility] order, Performed in [facility] hospital lab)     Status: None   Collection Time: 02/21/19  2:49 AM  Result Value Ref Range   SARS Coronavirus 2 NEGATIVE NEGATIVE    Comment: (NOTE) If  result is NEGATIVE SARS-CoV-2 target nucleic acids are NOT DETECTED. The SARS-CoV-2 RNA is generally detectable in upper and lower  respiratory specimens during the acute phase of infection. The lowest  concentration of SARS-CoV-2 viral copies this assay can detect is 250  copies / mL. A negative result does not preclude SARS-CoV-2 infection  and should not be used as the sole basis for treatment or other  patient management decisions.  A negative result may occur with  improper specimen collection / handling, submission of specimen other  than nasopharyngeal swab, presence of viral mutation(s) within the  areas targeted by this assay, and inadequate number of viral copies  (<250 copies / mL). A negative result must be combined with clinical  observations, patient history, and epidemiological information. If result is POSITIVE SARS-CoV-2 target nucleic acids are DETECTED. The SARS-CoV-2 RNA is generally detectable in upper and lower  respiratory specimens dur ing the acute phase of infection.  Positive  results are indicative of active infection with SARS-CoV-2.  Clinical  correlation with patient history and other diagnostic information is  necessary to determine patient infection status.  Positive results do  not rule out bacterial infection or co-infection with other viruses. If result is PRESUMPTIVE POSTIVE SARS-CoV-2 nucleic acids MAY BE PRESENT.   A presumptive positive result was obtained on the submitted specimen  and confirmed on repeat testing.  While 2019 novel coronavirus  (SARS-CoV-2) nucleic acids may be present in the submitted sample  additional confirmatory testing may be necessary for epidemiological  and / or clinical management purposes  to differentiate between  SARS-CoV-2 and other Sarbecovirus currently known to infect humans.  If clinically indicated additional testing with an alternate test  methodology [id number]) is advised. The SARS-CoV-2 RNA is generally   detectable in upper and lower respiratory sp ecimens during the acute  phase of infection. The expected result is Negative. Fact Sheet for Patients:  StrictlyIdeas.no Fact Sheet for Healthcare Providers: BankingDealers.co.za This test is not yet approved or cleared by the [place] FDA and has been authorized for detection and/or diagnosis of SARS-CoV-2 by FDA under an Emergency Use Authorization (EUA).  This EUA will remain in effect (meaning this test can be used) for the duration of the COVID-19 declaration under Section 564(b)(1) of the Act, 21 U.S.C. section 360bbb-3(b)(1), unless the authorization is terminated or revoked sooner. Performed at [facility], [address] [place], [place] [address]   CBC with Differential     Status: Abnormal   Collection Time: 02/21/19  2:56 AM  Result Value Ref Range   WBC 14.6 (H) 4.0 - 10.5 K/uL   RBC 3.19 (L) 4.22 - 5.81 MIL/uL   Hemoglobin 10.0 (L) 13.0 - 17.0 g/dL   HCT 31.8 (L) 39.0 - 52.0 %   MCV 99.7 80.0 -  100.0 fL   MCH 31.3 26.0 - 34.0 pg   MCHC 31.4 30.0 - 36.0 g/dL   RDW 15.6 (H) 11.5 - 15.5 %   Platelets 296 150 - 400 K/uL   nRBC 0.0 0.0 - 0.2 %   Neutrophils Relative % 78 %   Neutro Abs 11.5 (H) 1.7 - 7.7 K/uL   Lymphocytes Relative 15 %   Lymphs Abs 2.2 0.7 - 4.0 K/uL   Monocytes Relative 3 %   Monocytes Absolute 0.4 0.1 - 1.0 K/uL   Eosinophils Relative 2 %   Eosinophils Absolute 0.3 0.0 - 0.5 K/uL   Basophils Relative 1 %   Basophils Absolute 0.1 0.0 - 0.1 K/uL   Immature Granulocytes 1 %   Abs Immature Granulocytes 0.08 (H) 0.00 - 0.07 K/uL    Comment: Performed at [facility], [address] [name], [place] [address]  Comprehensive metabolic panel     Status: Abnormal   Collection Time: 02/21/19  2:56 AM  Result Value Ref Range   Sodium 144 135 - 145 mmol/L   Potassium 4.2 3.5 - 5.1 mmol/L   Chloride 102 98 - 111 mmol/L   CO2 25 22 -  32 mmol/L   Glucose, Bld 170 (H) 70 - 99 mg/dL   BUN 50 (H) 6 - 20 mg/dL   Creatinine, Ser 6.51 (H) 0.61 - 1.24 mg/dL   Calcium 7.9 (L) 8.9 - 10.3 mg/dL   Total Protein 7.9 6.5 - 8.1 g/dL   Albumin 3.8 3.5 - 5.0 g/dL   AST 25 15 - 41 U/L   ALT 22 0 - 44 U/L   Alkaline Phosphatase 84 38 - 126 U/L   Total Bilirubin 0.8 0.3 - 1.2 mg/dL   GFR calc non Af Amer 9 (L) >60 mL/min   GFR calc Af Amer 11 (L) >60 mL/min   Anion gap 17 (H) 5 - 15    Comment: Performed at [facility], [address] [place], [place] [address]  Troponin I - ONCE - STAT     Status: None   Collection Time: 02/21/19  2:56 AM  Result Value Ref Range   Troponin I <0.03 <0.03 ng/mL    Comment: Performed at [facility], [address] [place], [place] [address]  Brain natriuretic peptide     Status: Abnormal   Collection Time: 02/21/19  2:56 AM  Result Value Ref Range   B Natriuretic Peptide 2,865.0 (H) 0.0 - 100.0 pg/mL    Comment: Performed at [facility], [address] [place], [place] [address]  Blood gas, venous     Status: Abnormal   Collection Time: 02/21/19  3:27 AM  Result Value Ref Range   FIO2 0.40    Delivery systems BILEVEL POSITIVE AIRWAY PRESSURE    LHR 10 resp/min   pH, Ven 7.30 7.250 - 7.430   pCO2, Ven 48 44.0 - 60.0 mmHg   pO2, Ven <31.0 (LL) 32.0 - 45.0 mmHg    Comment: CRITICAL RESULT CALLED TO, READ BACK BY AND VERIFIED WITH:   Bicarbonate 23.6 20.0 - 28.0 mmol/L   Acid-base deficit 3.2 (H) 0.0 - 2.0 mmol/L   O2 Saturation 42.5 %   Patient temperature 37.0    Collection site VEIN    Sample type VENOUS     Comment: Performed at [facility], [address], [place], [place] [address]   Dg Chest Portable 1 View  Result Date: 02/21/2019 CLINICAL DATA:  Shortness of  breath EXAM: PORTABLE CHEST 1 VIEW COMPARISON:  02/06/2019 FINDINGS: Cardiomegaly with vascular congestion and interstitial prominence compatible with interstitial edema. No visible  effusions or acute bony abnormality. IMPRESSION: Cardiomegaly.  Mild interstitial edema. Electronically Signed   By: Rolm Baptise M.D.   On: 02/21/2019 03:21    Review of Systems  Constitutional: Negative for chills and fever.  HENT: Negative for sore throat and tinnitus.   Eyes: Negative for blurred vision and redness.  Respiratory: Positive for shortness of breath. Negative for cough.   Cardiovascular: Negative for chest pain, palpitations, orthopnea and PND.  Gastrointestinal: Negative for abdominal pain, diarrhea, nausea and vomiting.  Genitourinary: Negative for dysuria, frequency and urgency.  Musculoskeletal: Negative for joint pain and myalgias.  Skin: Negative for rash.       No lesions  Neurological: Negative for speech change, focal weakness and weakness.  Endo/Heme/Allergies: Does not bruise/bleed easily.       No temperature intolerance  Psychiatric/Behavioral: Negative for depression and suicidal ideas.    Blood pressure 135/78, pulse 96, temperature 98.5 F (36.9 C), temperature source Rectal, resp. rate (!) 23, SpO2 97 %. Physical Exam  Vitals reviewed. Constitutional: He is oriented to person, place, and time. He appears well-developed and well-nourished. No distress.  HENT:  Head: Normocephalic and atraumatic.  Mouth/Throat: Oropharynx is clear and moist.  Eyes: Pupils are equal, round, and reactive to light. Conjunctivae and EOM are normal. No scleral icterus.  Neck: Normal range of motion. Neck supple. No JVD present. No tracheal deviation present. No thyromegaly present.  Cardiovascular: Normal rate, regular rhythm and normal heart sounds. Exam reveals no gallop and no friction rub.  No murmur heard. Respiratory: Breath sounds normal. He is in respiratory distress.  GI: Soft. Bowel sounds are normal. He exhibits no distension. There is no abdominal tenderness.  Genitourinary:    Genitourinary Comments: Deferred   Musculoskeletal: Normal range of motion.         General: No edema.  Lymphadenopathy:    He has no cervical adenopathy.  Neurological: He is alert and oriented to person, place, and time. No cranial nerve deficit.  Skin: Skin is warm and dry. No rash noted. No erythema.  Psychiatric: He has a normal mood and affect. His behavior is normal. Judgment and thought content normal.     Assessment/Plan This is a 43 year old male admitted for respiratory failure. 1.  Respiratory failure: Acute; with hypoxia.  Secondary to flash pulmonary edema.  Currently oxygenating well on BiPAP.  Continue to manage blood pressure.  Plan for dialysis later today. 2.  Hypertension: Uncontrolled; resume hydralazine per home regimen.  Reconcile home meds with pharmacy.  Labetalol as needed. 3.  ESRD: Fluid overload; urgent dialysis today (per schedule).  Consult nephrology.  Continue Renvela 4.  Leukocytosis: Presumably secondary to stress response.  The patient has no obvious source of infection at this time and thus my suspicion for septicemia is very low.  Continue to monitor 5.  DVT prophylaxis: Continue full anticoagulation per home regimen (reconcile meds with pharmacy) 6.  GI prophylaxis: None The patient is a full code.  I personally spent 45 minutes in critical care time with this patient.  Harrie Foreman, MD 02/21/2019, 5:27 AM

## 2019-02-21 NOTE — ED Provider Notes (Addendum)
[facility] Emergency Department Provider Note    None    (approximate)  I have reviewed the triage vital signs and the nursing notes.   HISTORY  Chief Complaint Shortness of Breath  Level V caveat:  Respiratory distress  HPI Brett Small is a 43 y.o. male [place] past medical history arrives to the ER with worsening shortness of breath.  Patient is a Tuesday Thursday Saturday dialysis patient.  Denies any fevers.  States he has been coughing up phlegm movements are becoming more short of breath over the past 12 hours.  Denies any pain.  No nausea or vomiting.  He denies any fevers.  Was brought in by EMS found to be profoundly hypoxic requiring nonrebreather.  Patient arrives diaphoretic and in moderate respiratory distress.    Past Medical History:  Diagnosis Date  . Hypertension   . Renal disorder    Family History  Problem Relation Age of Onset  . Hypertension Other   . Diabetes Other   . Clotting disorder Father    Past Surgical History:  Procedure Laterality Date  . A/V SHUNT INTERVENTION Left 01/19/2019   Procedure: LEFT UPPER EXTREMITY A/V SHUNTOGRAM / UPPER EXTREMITY ANGIOGRAM;  Surgeon: Algernon Huxley, MD;  Location: [place] [facility];  Service: Cardiovascular;  Laterality: Left;  . AORTA - FEMORAL ARTERY BYPASS GRAFT    . AV FISTULA PLACEMENT Left 12/26/2018   Procedure: INSERTION OF GORE STRETCH VASCULAR 4-7MM X  45CM IN LEFT UPPER ARM;  Surgeon: Marty Heck, MD;  Location: [place];  Service: Vascular;  Laterality: Left;  . DIALYSIS/PERMA CATHETER REMOVAL N/A 02/06/2019   Procedure: DIALYSIS/PERMA CATHETER REMOVAL;  Surgeon: Algernon Huxley, MD;  Location: [place] [facility];  Service: Cardiovascular;  Laterality: N/A;   Patient Active Problem List   Diagnosis Date Noted  . Acute respiratory failure ([place]) 02/04/2019  . Acute respiratory failure with hypoxemia ([place]) 01/27/2019  . Depression 01/07/2019  . MDD (major depressive  disorder), single episode, severe , no psychosis ([place])   . Homelessness   . Acute respiratory failure with hypoxia ([place]) 12/25/2018  . End stage renal disease ([place]) 12/25/2018  . Hypertension 12/25/2018  . Renal osteodystrophy 12/25/2018      Prior to Admission medications   Medication Sig Start Date End Date Taking? Authorizing Provider  amLODipine (NORVASC) 5 MG tablet Take 1 tablet (5 mg total) by mouth daily at 8 pm. 01/26/19   Clapacs, Madie Reno, MD  apixaban (ELIQUIS) 2.5 MG TABS tablet Take 1 tablet (2.5 mg total) by mouth 2 (two) times daily. 01/26/19   Clapacs, Madie Reno, MD  aspirin EC 81 MG tablet Take 1 tablet (81 mg total) by mouth daily. 01/26/19   Clapacs, Madie Reno, MD  atorvastatin (LIPITOR) 80 MG tablet Take 1 tablet (80 mg total) by mouth at bedtime. 01/26/19   Clapacs, Madie Reno, MD  carvedilol (COREG) 25 MG tablet Take 1 tablet (25 mg total) by mouth 2 (two) times daily with a meal. 01/26/19   Clapacs, Madie Reno, MD  gabapentin (NEURONTIN) 100 MG capsule Take 1 capsule (100 mg total) by mouth 3 (three) times daily. 01/26/19   Clapacs, Madie Reno, MD  hydrALAZINE (APRESOLINE) 25 MG tablet Take 1 tablet (25 mg total) by mouth every 8 (eight) hours. 01/26/19   Clapacs, Madie Reno, MD  hydrOXYzine (ATARAX/VISTARIL) 25 MG tablet Take 25 mg by mouth 3 (three) times daily as needed for anxiety.    [provider]  multivitamin (RENA-VIT) TABS tablet Take 1 tablet by mouth daily. 01/27/19   Clapacs, Madie Reno, MD  neomycin-bacitracin-polymyxin (NEOSPORIN) OINT Apply 1 application topically 2 (two) times daily. 01/26/19   Clapacs, Madie Reno, MD  OxyCODONE HCl, Abuse Deter, (OXAYDO) 5 MG TABA Take 5 mg by mouth every 4 (four) hours as needed (pain).    [provider]  sevelamer carbonate (RENVELA) 800 MG tablet Take 2 tablets (1,600 mg total) by mouth 3 (three) times daily with meals. 01/26/19   Clapacs, Madie Reno, MD    Allergies Codeine and Sulfa antibiotics    Social History Social History   Tobacco  Use  . Smoking status: Former Smoker    Last attempt to quit: 06/26/2018    Years since quitting: 0.6  . Smokeless tobacco: Never Used  . Tobacco comment: smoked for 30 years   Substance Use Topics  . Alcohol use: Not Currently  . Drug use: Never    Review of Systems Patient denies headaches, rhinorrhea, blurry vision, numbness, shortness of breath, chest pain, edema, cough, abdominal pain, nausea, vomiting, diarrhea, dysuria, fevers, rashes or hallucinations unless otherwise stated above in HPI. ____________________________________________   PHYSICAL EXAM:  VITAL SIGNS: Vitals:   02/21/19 0640 02/21/19 0650  BP: 133/83 134/74  Pulse: 91 91  Resp: 18 (!) 21  Temp:    SpO2: 100% 100%    Constitutional: Alert and oriented. Diaphoretic and critically ill appearing Eyes: Conjunctivae are normal.  Head: Atraumatic. Nose: No congestion/rhinnorhea. Mouth/Throat: Mucous membranes are moist.   Neck: No stridor. Painless ROM.  Cardiovascular:tachycardic rate, regular rhythm. Grossly normal heart sounds.  Good peripheral circulation. Respiratory:tachypnea with use of accessory muscles, diminished bibasilar breathsounds. Gastrointestinal: Soft and nontender. No distention. No abdominal bruits. No CVA tenderness. Genitourinary: deferred Musculoskeletal: No lower extremity tenderness nor edema.  No joint effusions. Neurologic:   No gross focal neurologic deficits are appreciated.  Skin:  No rash noted. Psychiatric: Mood and affect are anxious Speech and behavior are normal.  ____________________________________________   LABS (all labs ordered are listed, but only abnormal results are displayed)  Results for orders placed or performed during the hospital encounter of 02/21/19 (from the past 24 hour(s))  SARS Coronavirus 2 [facility] order, Performed in [place] hospital lab)     Status: None   Collection Time: 02/21/19  2:49 AM  Result Value Ref Range   SARS Coronavirus 2  NEGATIVE NEGATIVE  CBC with Differential     Status: Abnormal   Collection Time: 02/21/19  2:56 AM  Result Value Ref Range   WBC 14.6 (H) 4.0 - 10.5 K/uL   RBC 3.19 (L) 4.22 - 5.81 MIL/uL   Hemoglobin 10.0 (L) 13.0 - 17.0 g/dL   HCT 31.8 (L) 39.0 - 52.0 %   MCV 99.7 80.0 - 100.0 fL   MCH 31.3 26.0 - 34.0 pg   MCHC 31.4 30.0 - 36.0 g/dL   RDW 15.6 (H) 11.5 - 15.5 %   Platelets 296 150 - 400 K/uL   nRBC 0.0 0.0 - 0.2 %   Neutrophils Relative % 78 %   Neutro Abs 11.5 (H) 1.7 - 7.7 K/uL   Lymphocytes Relative 15 %   Lymphs Abs 2.2 0.7 - 4.0 K/uL   Monocytes Relative 3 %   Monocytes Absolute 0.4 0.1 - 1.0 K/uL   Eosinophils Relative 2 %   Eosinophils Absolute 0.3 0.0 - 0.5 K/uL   Basophils Relative 1 %   Basophils Absolute 0.1 0.0 -  0.1 K/uL   Immature Granulocytes 1 %   Abs Immature Granulocytes 0.08 (H) 0.00 - 0.07 K/uL  Comprehensive metabolic panel     Status: Abnormal   Collection Time: 02/21/19  2:56 AM  Result Value Ref Range   Sodium 144 135 - 145 mmol/L   Potassium 4.2 3.5 - 5.1 mmol/L   Chloride 102 98 - 111 mmol/L   CO2 25 22 - 32 mmol/L   Glucose, Bld 170 (H) 70 - 99 mg/dL   BUN 50 (H) 6 - 20 mg/dL   Creatinine, Ser 6.51 (H) 0.61 - 1.24 mg/dL   Calcium 7.9 (L) 8.9 - 10.3 mg/dL   Total Protein 7.9 6.5 - 8.1 g/dL   Albumin 3.8 3.5 - 5.0 g/dL   AST 25 15 - 41 U/L   ALT 22 0 - 44 U/L   Alkaline Phosphatase 84 38 - 126 U/L   Total Bilirubin 0.8 0.3 - 1.2 mg/dL   GFR calc non Af Amer 9 (L) >60 mL/min   GFR calc Af Amer 11 (L) >60 mL/min   Anion gap 17 (H) 5 - 15  Troponin I - ONCE - STAT     Status: None   Collection Time: 02/21/19  2:56 AM  Result Value Ref Range   Troponin I <0.03 <0.03 ng/mL  Brain natriuretic peptide     Status: Abnormal   Collection Time: 02/21/19  2:56 AM  Result Value Ref Range   B Natriuretic Peptide 2,865.0 (H) 0.0 - 100.0 pg/mL  Blood gas, venous     Status: Abnormal   Collection Time: 02/21/19  3:27 AM  Result Value Ref Range    FIO2 0.40    Delivery systems BILEVEL POSITIVE AIRWAY PRESSURE    LHR 10 resp/min   pH, Ven 7.30 7.250 - 7.430   pCO2, Ven 48 44.0 - 60.0 mmHg   pO2, Ven <31.0 (LL) 32.0 - 45.0 mmHg   Bicarbonate 23.6 20.0 - 28.0 mmol/L   Acid-base deficit 3.2 (H) 0.0 - 2.0 mmol/L   O2 Saturation 42.5 %   Patient temperature 37.0    Collection site VEIN    Sample type VENOUS    ____________________________________________  EKG My review and personal interpretation at Time: 2:47   Indication: resp distress  Rate: 140  Rhythm: sinus Axis: normal Other: lateral st depressions, no stemi ____________________________________________  RADIOLOGY  I personally reviewed all radiographic images ordered to evaluate for the above acute complaints and reviewed radiology reports and findings.  These findings were personally discussed with the patient.  Please see medical record for radiology report.  ____________________________________________   PROCEDURES  Procedure(s) performed:  .Critical Care Performed by: Merlyn Lot, MD Authorized by: Merlyn Lot, MD   Critical care provider statement:    Critical care time (minutes):  35   Critical care time was exclusive of:  Separately billable procedures and treating other patients   Critical care was necessary to treat or prevent imminent or life-threatening deterioration of the following conditions:  Respiratory failure   Critical care was time spent personally by me on the following activities:  Development of treatment plan with patient or surrogate, discussions with consultants, evaluation of patient's response to treatment, examination of patient, obtaining history from patient or surrogate, ordering and performing treatments and interventions, ordering and review of laboratory studies, ordering and review of radiographic studies, pulse oximetry, re-evaluation of patient's condition and review of old charts    Due to difficulty with obtaining  IV access, a  20G peripheral IV catheter was inserted using US guidance into the right forearm.  The site was prepped with chlorhexidine and allowed to dry.  The patient tolerated the procedure without any complications.  Due to difficulty with obtaining IV access, a 20G peripheral IV catheter was inserted using US guidance into the RUE.  The site was prepped with chlorhexidine and allowed to dry.  The patient tolerated the procedure without any complications.    Critical Care performed: yes ____________________________________________   INITIAL IMPRESSION / ASSESSMENT AND PLAN / ED COURSE  Pertinent labs & imaging results that were available during my care of the patient were reviewed by me and considered in my medical decision making (see chart for details).   DDX: Asthma, copd, CHF, pna, ptx, malignancy, Pe, anemia   Brett Small is a 43 y.o. who presents to the ED with symptoms as described above arrives in respiratory distress.  Patient diaphoretic.  Placed emergently on BiPAP.  The patient will be placed on continuous pulse oximetry and telemetry for monitoring.  Laboratory evaluation will be sent to evaluate for the above complaints.  Patient without any fever but will work-up for possible pneumonia though this seems more clinically consistent with CHF or flash pulmonary edema and hypertensive urgency given his respiratory distress with significant hypertension.   Clinical Course as of Feb 20 709  Sat Feb 21, 2019  0320 Patient finally starting to improve after several doses of IV Lopressor for rate control and increased filling time with initiation of IV nitroglycerin.  Also improving on BiPAP.  Still very diaphoretic but states that shortness of breath is improving.   [PR]  0330 Patient reassessed.  Significant improvement in symptoms.  Respirations much more comfortable.  No longer diaphoretic.  Denies any pain.  States that he feels improved.   [PR]  [id number] Patient reassessed.   Continues to improve clinically.  Suspect this is primarily flash pulmonary edema in the setting of hypertensive emergency.  COVID test is negative.  Stable and appropriate for admission to the hospital.   [PR]  [phone] Discussed case with Dr. Holley Raring of nephrology.   [PR]    Clinical Course User Index [PR] Merlyn Lot, MD    The patient was evaluated in Emergency Department today for the symptoms described in the history of present illness. He/she was evaluated in the context of the global COVID-19 pandemic, which necessitated consideration that the patient might be at risk for infection with the SARS-CoV-2 virus that causes COVID-19. Institutional protocols and algorithms that pertain to the evaluation of patients at risk for COVID-19 are in a state of rapid change based on information released by regulatory bodies including the CDC and federal and state organizations. These policies and algorithms were followed during the patient's care in the ED.  As part of my medical decision making, I reviewed the following data within the [place] notes reviewed and incorporated, Labs reviewed, notes from prior ED visits and [place] Controlled Substance Database   ____________________________________________   FINAL CLINICAL IMPRESSION(S) / ED DIAGNOSES  Final diagnoses:  Acute respiratory failure with hypoxia ([place])  Flash pulmonary edema ([place])  Hypertensive emergency      NEW MEDICATIONS STARTED DURING THIS VISIT:  New Prescriptions   No medications on file     Note:  This document was prepared using Dragon voice recognition software and may include unintentional dictation errors.    Merlyn Lot, MD 02/21/19 7169    Merlyn Lot, MD 02/21/19 [phone]  Merlyn Lot, MD 02/21/19 [phone]

## 2019-02-21 NOTE — ED Notes (Signed)
ED TO INPATIENT HANDOFF REPORT  ED Nurse Name and Phone #: Gwynn Burly Name/Age/Gender Brett Small 43 y.o. male Room/Bed: ED01A/ED01A  Code Status   Code Status: Prior  Home/SNF/Other Home Patient oriented to: self, place, time and situation Is this baseline? Yes   Triage Complete: Triage complete  Chief Complaint [facility] Diff Breathing  Triage Note Pt here with sob , via ems , pt placed in ed 1 , pt observed working to breath , a/o X 4, Due for HD this am .    Allergies Allergies  Allergen Reactions  . Codeine Nausea Only    Patient questioned this (??)  . Sulfa Antibiotics Hives and Nausea And Vomiting    Level of Care/Admitting Diagnosis ED Disposition    ED Disposition Condition [place] [facility]: [place] [[id number]]  Level of Care: Stepdown [14]  Covid Evaluation: N/A  Diagnosis: Acute respiratory failure with hypoxemia [facility]) [[id number]]  Admitting Physician: Harrie Foreman [[id number]]  Attending Physician: Harrie Foreman [[id number]]  Estimated length of stay: past midnight tomorrow  Certification:: I certify this patient will need inpatient services for at least 2 midnights  PT Class (Do Not Modify): Inpatient [101]  PT Acc Code (Do Not Modify): Private [1]       B Medical/Surgery History Past Medical History:  Diagnosis Date  . Hypertension   . Renal disorder    Past Surgical History:  Procedure Laterality Date  . A/V SHUNT INTERVENTION Left 01/19/2019   Procedure: LEFT UPPER EXTREMITY A/V SHUNTOGRAM / UPPER EXTREMITY ANGIOGRAM;  Surgeon: Algernon [place], MD;  Location: [place] [facility];  Service: Cardiovascular;  Laterality: Left;  . AORTA - FEMORAL ARTERY BYPASS GRAFT    . AV FISTULA PLACEMENT Left 12/26/2018   Procedure: INSERTION OF GORE STRETCH VASCULAR 4-7MM X  45CM IN LEFT UPPER ARM;  Surgeon: Marty Heck, MD;  Location: [place];  Service: Vascular;  Laterality: Left;  . DIALYSIS/PERMA CATHETER  REMOVAL N/A 02/06/2019   Procedure: DIALYSIS/PERMA CATHETER REMOVAL;  Surgeon: Algernon [place], MD;  Location: [place] [facility];  Service: Cardiovascular;  Laterality: N/A;     A IV Location/Drains/Wounds Patient Lines/Drains/Airways Status   Active Line/Drains/Airways    Name:   Placement date:   Placement time:   Site:   Days:   Peripheral IV 02/21/19 Right    02/21/19    0325    -   less than 1   Peripheral IV 02/21/19 Right Antecubital   02/21/19    0326    Antecubital   less than 1   Fistula / Graft Left Upper arm Arteriovenous vein graft   12/26/18    1024    Upper arm   57          Intake/Output Last 24 hours No intake or output data in the 24 hours ending 02/21/19 1660  Labs/Imaging Results for orders placed or performed during the hospital encounter of 02/21/19 (from the past 48 hour(s))  SARS Coronavirus 2 [facility] order, Performed in [place] hospital lab)     Status: None   Collection Time: 02/21/19  2:49 AM  Result Value Ref Range   SARS Coronavirus 2 NEGATIVE NEGATIVE    Comment: (NOTE) If result is NEGATIVE SARS-CoV-2 target nucleic acids are NOT DETECTED. The SARS-CoV-2 RNA is generally detectable in upper and lower  respiratory specimens during the acute phase of infection. The lowest  concentration of SARS-CoV-2 viral copies  this assay can detect is 250  copies / mL. A negative result does not preclude SARS-CoV-2 infection  and should not be used as the sole basis for treatment or other  patient management decisions.  A negative result may occur with  improper specimen collection / handling, submission of specimen other  than nasopharyngeal swab, presence of viral mutation(s) within the  areas targeted by this assay, and inadequate number of viral copies  (<250 copies / mL). A negative result must be combined with clinical  observations, patient history, and epidemiological information. If result is POSITIVE SARS-CoV-2 target nucleic acids are  DETECTED. The SARS-CoV-2 RNA is generally detectable in upper and lower  respiratory specimens dur ing the acute phase of infection.  Positive  results are indicative of active infection with SARS-CoV-2.  Clinical  correlation with patient history and other diagnostic information is  necessary to determine patient infection status.  Positive results do  not rule out bacterial infection or co-infection with other viruses. If result is PRESUMPTIVE POSTIVE SARS-CoV-2 nucleic acids MAY BE PRESENT.   A presumptive positive result was obtained on the submitted specimen  and confirmed on repeat testing.  While 2019 novel coronavirus  (SARS-CoV-2) nucleic acids may be present in the submitted sample  additional confirmatory testing may be necessary for epidemiological  and / or clinical management purposes  to differentiate between  SARS-CoV-2 and other Sarbecovirus currently known to infect humans.  If clinically indicated additional testing with an alternate test  methodology [phone]) is advised. The SARS-CoV-2 RNA is generally  detectable in upper and lower respiratory sp ecimens during the acute  phase of infection. The expected result is Negative. Fact Sheet for Patients:  StrictlyIdeas.no Fact Sheet for Healthcare Providers: BankingDealers.co.za This test is not yet approved or cleared by the [place] FDA and has been authorized for detection and/or diagnosis of SARS-CoV-2 by FDA under an Emergency Use Authorization (EUA).  This EUA will remain in effect (meaning this test can be used) for the duration of the COVID-19 declaration under Section 564(b)(1) of the Act, 21 U.S.C. section 360bbb-3(b)(1), unless the authorization is terminated or revoked sooner. Performed at [facility], [address] [place], [place] [address]   CBC with Differential     Status: Abnormal   Collection Time: 02/21/19  2:56 AM  Result Value  Ref Range   WBC 14.6 (H) 4.0 - 10.5 K/uL   RBC 3.19 (L) 4.22 - 5.81 MIL/uL   Hemoglobin 10.0 (L) 13.0 - 17.0 g/dL   HCT 31.8 (L) 39.0 - 52.0 %   MCV 99.7 80.0 - 100.0 fL   MCH 31.3 26.0 - 34.0 pg   MCHC 31.4 30.0 - 36.0 g/dL   RDW 15.6 (H) 11.5 - 15.5 %   Platelets 296 150 - 400 K/uL   nRBC 0.0 0.0 - 0.2 %   Neutrophils Relative % 78 %   Neutro Abs 11.5 (H) 1.7 - 7.7 K/uL   Lymphocytes Relative 15 %   Lymphs Abs 2.2 0.7 - 4.0 K/uL   Monocytes Relative 3 %   Monocytes Absolute 0.4 0.1 - 1.0 K/uL   Eosinophils Relative 2 %   Eosinophils Absolute 0.3 0.0 - 0.5 K/uL   Basophils Relative 1 %   Basophils Absolute 0.1 0.0 - 0.1 K/uL   Immature Granulocytes 1 %   Abs Immature Granulocytes 0.08 (H) 0.00 - 0.07 K/uL    Comment: Performed at [facility], [address], [place], [place] [address]  Comprehensive metabolic panel     Status: Abnormal   Collection Time: 02/21/19  2:56 AM  Result Value Ref Range   Sodium 144 135 - 145 mmol/L   Potassium 4.2 3.5 - 5.1 mmol/L   Chloride 102 98 - 111 mmol/L   CO2 25 22 - 32 mmol/L   Glucose, Bld 170 (H) 70 - 99 mg/dL   BUN 50 (H) 6 - 20 mg/dL   Creatinine, Ser 6.51 (H) 0.61 - 1.24 mg/dL   Calcium 7.9 (L) 8.9 - 10.3 mg/dL   Total Protein 7.9 6.5 - 8.1 g/dL   Albumin 3.8 3.5 - 5.0 g/dL   AST 25 15 - 41 U/L   ALT 22 0 - 44 U/L   Alkaline Phosphatase 84 38 - 126 U/L   Total Bilirubin 0.8 0.3 - 1.2 mg/dL   GFR calc non Af Amer 9 (L) >60 mL/min   GFR calc Af Amer 11 (L) >60 mL/min   Anion gap 17 (H) 5 - 15    Comment: Performed at [facility], [address] [place], [place] [address]  Troponin I - ONCE - STAT     Status: None   Collection Time: 02/21/19  2:56 AM  Result Value Ref Range   Troponin I <0.03 <0.03 ng/mL    Comment: Performed at [facility], [address] [place], [place] [address]  Brain natriuretic peptide     Status: Abnormal   Collection Time: 02/21/19  2:56 AM  Result Value Ref Range    B Natriuretic Peptide 2,865.0 (H) 0.0 - 100.0 pg/mL    Comment: Performed at [facility], [address] [place], [place] [address]  Blood gas, venous     Status: Abnormal   Collection Time: 02/21/19  3:27 AM  Result Value Ref Range   FIO2 0.40    Delivery systems BILEVEL POSITIVE AIRWAY PRESSURE    LHR 10 resp/min   pH, Ven 7.30 7.250 - 7.430   pCO2, Ven 48 44.0 - 60.0 mmHg   pO2, Ven <31.0 (LL) 32.0 - 45.0 mmHg    Comment: CRITICAL RESULT CALLED TO, READ BACK BY AND VERIFIED WITH:   Bicarbonate 23.6 20.0 - 28.0 mmol/L   Acid-base deficit 3.2 (H) 0.0 - 2.0 mmol/L   O2 Saturation 42.5 %   Patient temperature 37.0    Collection site VEIN    Sample type VENOUS     Comment: Performed at [facility], [address], [place], [place] [address]   Dg Chest Portable 1 View  Result Date: 02/21/2019 CLINICAL DATA:  Shortness of breath EXAM: PORTABLE CHEST 1 VIEW COMPARISON:  02/06/2019 FINDINGS: Cardiomegaly with vascular congestion and interstitial prominence compatible with interstitial edema. No visible effusions or acute bony abnormality. IMPRESSION: Cardiomegaly.  Mild interstitial edema. Electronically Signed   By: Rolm Baptise M.D.   On: 02/21/2019 03:21    Pending Labs [employer] (From admission, onward)    Start     Ordered   Signed and Held  TSH  Add-on,   R     Signed and Held          Vitals/Pain Today's Vitals   02/21/19 0430 02/21/19 0440 02/21/19 0450 02/21/19 0500  BP: (!) 143/87 138/81 134/81 135/78  Pulse: 100 99 95 96  Resp: 14 14 20  (!) 23  Temp:      TempSrc:      SpO2: 99% 97% 95% 97%  PainSc:  Isolation Precautions No active isolations  Medications Medications  metoprolol tartrate (LOPRESSOR) 5 MG/5ML injection (3 mg  Given 02/21/19 0318)  nitroGLYCERIN 0.2 mg/mL in dextrose 5 % infusion (25 mcg/min  Rate/Dose Change 02/21/19 0415)    Mobility walks     Focused Assessments    R Recommendations: See Admitting  Provider Note  Report given to:   Additional Notes: none

## 2019-02-21 NOTE — Progress Notes (Signed)
NP notified of hgb 7.5, acknowledged. No new orders at this time.

## 2019-02-21 NOTE — ED Notes (Signed)
Unable to give report at this time per ICU. RN to call to receive report.

## 2019-02-21 NOTE — Progress Notes (Signed)
This note also relates to the following rows which could not be included: Pulse Rate - Cannot attach notes to unvalidated device data Resp - Cannot attach notes to unvalidated device data BP - Cannot attach notes to unvalidated device data SpO2 - Cannot attach notes to unvalidated device data  New HD set up for ICU8- 30 minute wait time

## 2019-02-21 NOTE — Progress Notes (Signed)
Rt assisted with patient transfer from ER to ICU room 8, while patient on V60 Bipap, with no complications. Once patient settled into ICU bed, patient taken off of Bipap and placed on 3LNC.

## 2019-02-21 NOTE — Progress Notes (Signed)
Patient heart rate remains elevated 140's.Patient placed on non-re breather.  Patient is  diaphoretic with increased shortness of breath. Blood returned and rapid response initiated. MD notified.

## 2019-02-21 NOTE — Consult Note (Signed)
CRITICAL CARE NOTE      CHIEF COMPLAINT:   Acute hypoxemic respiratory failure   HPI   This is a 43 yo pleasant male with a hx of ESRD with LUE fistula that is 43 weeks old, previously had right subclavian permCath s/p staph aureus bacteremia with removal of HD subclavian port and has been on IV vancomycin thrice weekly during HD for past 3 weeks. 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  He was admitted to MICU recently and was intubated for acute hypoxemic respiratory failue, found to have MSSA bacteremia was treated and discharged on 4/20.  He is a lifelong smoker and currently smokes MJ, states prior to this admission he felt acute onset of SOB and intermitent dry cough with worsening dyspnea which prompted him to come to ED.  He denies having flu like illness, he denies having chills, fever, NVD but admits to diaphoresis, malaise, SOB/DOE, cough.  He was tested for novel corona and was negative in ED. He was found to be hypoxemic in ED and placed on 100% nonrebreather mask. He was placed on BIPAP and improved.  In MICU he took BIPAP off for few minutes and SpO2 dropped to 84% but then improved to 88-90% on 3L/min [place]. Lab work on this admission is significant for BNP 2865, Leukocytosis 14.6K with left shift, worsening GFR on backround of ESRD (patient does make some urine daily). CXR significant for bilateral interstitial prominence suggestive pulmonary edema and RLL mild airspace opacification possibly infiltrate from pneumonia.   PAST MEDICAL HISTORY   Past Medical History:  Diagnosis Date  . Hypertension   . Renal disorder      SURGICAL  HISTORY   Past Surgical History:  Procedure Laterality Date  . A/V SHUNT INTERVENTION Left 01/19/2019   Procedure: LEFT UPPER EXTREMITY A/V SHUNTOGRAM / UPPER EXTREMITY ANGIOGRAM;  Surgeon: Algernon Huxley, MD;  Location: [facility];  Service: Cardiovascular;  Laterality: Left;  . AORTA - FEMORAL ARTERY BYPASS GRAFT    . AV FISTULA PLACEMENT Left 12/26/2018   Procedure: INSERTION OF GORE STRETCH VASCULAR 4-7MM X  45CM IN LEFT UPPER ARM;  Surgeon: Marty Heck, MD;  Location: [facility];  Service: Vascular;  Laterality: Left;  . DIALYSIS/PERMA CATHETER REMOVAL N/A 02/06/2019   Procedure: DIALYSIS/PERMA CATHETER REMOVAL;  Surgeon: Algernon Huxley, MD;  Location: [facility];  Service: Cardiovascular;  Laterality: N/A;     FAMILY HISTORY   Family History  Problem Relation Age of Onset  . Hypertension Other   . Diabetes Other   . Clotting disorder Father      SOCIAL HISTORY   Social History   Tobacco Use  . Smoking status: Former Smoker    Last attempt to quit: 06/26/2018    Years since quitting: 0.6  . Smokeless tobacco: Never Used  . Tobacco comment: smoked for 30 years   Substance Use Topics  . Alcohol use: Not Currently  . Drug use: Never     MEDICATIONS   Current Medication:  Current Facility-Administered Medications:  .  acetaminophen (TYLENOL) tablet 650 mg, 650 mg, Oral, Q6H PRN **OR** acetaminophen (TYLENOL) suppository 650 mg, 650 mg, Rectal, Q6H PRN, Harrie Foreman, MD .  docusate sodium (COLACE) capsule 100 mg, 100 mg, Oral, BID, Harrie Foreman, MD .  hydrALAZINE (APRESOLINE) tablet 25 mg, 25 mg, Oral, Q8H, Harrie Foreman, MD .  ondansetron [facility]) tablet 4 mg, 4 mg, Oral, Q6H PRN **OR** ondansetron (ZOFRAN) injection 4 mg, 4 mg, Intravenous, Q6H PRN, Harrie Foreman, MD .  sevelamer carbonate (RENVELA) tablet 1,600 mg, 1,600 mg, Oral, TID WC, Harrie Foreman, MD    ALLERGIES   Codeine and Sulfa antibiotics    REVIEW OF  SYSTEMS    ROS - 10 point conducted and is negative except hunger and SOB  PHYSICAL EXAMINATION   Vitals:   02/21/19 0830 02/21/19 0837  BP: (!) 152/88 (!) 156/92  Pulse: (!) 101 97  Resp: 19 15  Temp: 98.5 F (36.9 C)   SpO2: 97% 98%    GENERAL:chronically ill apprearing HEAD: Normocephalic, atraumatic.  EYES: Pupils equal, round, reactive to light.  No scleral icterus.  MOUTH: Moist mucosal membrane. NECK: Supple. No thyromegaly. No nodules. No JVD.  PULMONARY: mild bibasilar crackles.  CARDIOVASCULAR: S1 and S2. Regular rate and rhythm. No murmurs, rubs, or gallops.  GASTROINTESTINAL: Soft, nontender, non-distended. No masses. Positive bowel sounds. No hepatosplenomegaly.  MUSCULOSKELETAL: No swelling, clubbing, or edema.  NEUROLOGIC: Mild distress due to acute illness SKIN:intact,warm,dry   LABS AND IMAGING         LAB RESULTS: Recent Labs  Lab 02/21/19 0256  NA 144  K 4.2  CL 102  CO2 25  BUN 50*  CREATININE 6.51*  GLUCOSE 170*   Recent Labs  Lab 02/21/19 0256  HGB 10.0*  HCT 31.8*  WBC 14.6*  PLT 296     IMAGING RESULTS: Dg Chest Portable 1 View  Result Date: 02/21/2019 CLINICAL DATA:  Shortness of breath EXAM: PORTABLE CHEST 1 VIEW COMPARISON:  02/06/2019 FINDINGS: Cardiomegaly with vascular congestion and interstitial prominence compatible with interstitial edema. No visible effusions or acute bony abnormality. IMPRESSION: Cardiomegaly.  Mild interstitial edema. Electronically Signed   By: Rolm Baptise M.D.   On: 02/21/2019 03:21      ASSESSMENT AND PLAN    -Multidisciplinary rounds held today  Acute Hypoxic Respiratory Failure           In context of profound pulmonary edema -this am pt was transported to dialysis to receive RRT but had acute respiratory distress and was immediately brought back on the cusp of respiratory arrest almost needing intubation. - s/p BIPAP now improved to 3Lmin [place] - poss RLL pneumonia - empirically on  Cefepime/zithromax plus the IV vancomycin that he has been on with HD for MSSA bactermia -septic workup in process - including blood cultures X2, PCT, respiratory cultres.  -continue Full MV support -continue Bronchodilator Therapy -Wean Fio2 and PEEP as tolerated -will perform SAT/SBT when respiratory parameters are met     ESRD on HD      -scheduling for HD this am  -follow chem 7 -follow UO -continue Foley Catheter-assess need daily   Tobacco abuse and MJ abuse      - nicotine replacement therapy - patches      - outpatient pulmonary follow up for smoking cessation and COPD - appt made at [facility] pulmonary.  -oxygen as needed ICU monitoring    Possible sepsis     -patient with elevated WBC count, acute hypoxemic, RLL infiltrate     - has been on vancomycin for past few weeks due to MSSA bacteremia     - repeat blood cultures pending, PCT pending, resp culture pending      -empirically on cefepime, vanco, zithromax -  use vasopressors to keep MAP>65 -follow ABG and LA -follow up cultures -emperic ABX -consider stress dose steroids   ID -continue IV abx as prescibed -follow up cultures  GI/Nutrition GI PROPHYLAXIS as indicated DIET-->TF's as tolerated Constipation protocol as indicated  ENDO - ICU hypoglycemic\Hyperglycemia protocol -check FSBS per protocol   ELECTROLYTES -follow labs as needed -replace as needed -pharmacy consultation   DVT/GI PRX ordered -SCDs  TRANSFUSIONS AS NEEDED MONITOR FSBS ASSESS the need for LABS as needed   Critical care provider statement:    Critical care time (minutes):  107   Critical care time was exclusive of:  Separately billable procedures and treating other patients   Critical care was necessary to treat or prevent imminent or life-threatening deterioration of the following conditions:  Acute hypoxemic respiratory failure, MSSA bacteremia, ESRD, multiple comorbid conditions   Critical care was time spent personally by  me on the following activities:  Development of treatment plan with patient or surrogate, discussions with consultants, evaluation of patient's response to treatment, examination of patient, obtaining history from patient or surrogate, ordering and performing treatments and interventions, ordering and review of laboratory studies and re-evaluation of patient's condition.  I assumed direction of critical care for this patient from another provider in my specialty: no    This document was prepared using Dragon voice recognition software and may include unintentional dictation errors.    Ottie Glazier, M.D.  Division of [place]

## 2019-02-21 NOTE — Progress Notes (Signed)
RT responded to Rapid Response call to Dialysis to find patient tachypneic and diaphoretic. Dialysis RN had placed patient on 100% NRB prior to arrival and sats were 100% but he had excessive accessory muscle use. Katie RN brought patient Bipap from ICU, patient was placed back on Bipap, and pressures adjusted to give patient adequate pressure support. Settings left at 16/6 and 40%. Patient gradually improving. RN notified of setting change.

## 2019-02-21 NOTE — ED Notes (Signed)
Pt resting/sleeping off and on ,denies any pain .

## 2019-02-21 NOTE — Progress Notes (Signed)
Pharmacist-Physician Communication  Cefepime 1 g IV q24h ordered for this patient. Dosing for HCAP in patient on HD 3x/week is 2 g IV 3x/week after HD. Will order 2 g to be given now so that patient can get a dose to start therapy and follow up with HD schedule to schedule further doses of cefepime.  Tawnya Crook, PharmD Pharmacy Resident  02/21/2019 9:47 AM

## 2019-02-21 NOTE — ED Triage Notes (Signed)
Pt here with sob , via ems , pt placed in ed 1 , pt observed working to breath , a/o X 4, Due for HD this am .

## 2019-02-21 NOTE — Progress Notes (Signed)
This note also relates to the following rows which could not be included: Pulse Rate - Cannot attach notes to unvalidated device data Resp - Cannot attach notes to unvalidated device data BP - Cannot attach notes to unvalidated device data SpO2 - Cannot attach notes to unvalidated device data  TX resumed in [place]. Patient condition improved on BIPAP. Heart rate and blood pressure stabalized. 198 min remaining of 210 min treatment.    02/21/19 1100  Vital Signs  Pulse Rate Source Monitor  BP Location Right Arm  BP Method Automatic  Patient Position (if appropriate) Lying  Oxygen Therapy  O2 Device Bi-PAP  During Hemodialysis Assessment  Blood Flow Rate (mL/min) 400 mL/min  Arterial Pressure (mmHg) -80 mmHg  Venous Pressure (mmHg) 190 mmHg  Transmembrane Pressure (mmHg) 80 mmHg  Ultrafiltration Rate (mL/min) 910 mL/min  Dialysate Flow Rate (mL/min) 800 ml/min  Conductivity: Machine  14  HD Safety Checks Performed Yes  Dialysis Fluid Bolus Normal Saline  Bolus Amount (mL) 250 mL  Intra-Hemodialysis Comments Tx initiated (tx resumed in ICU8.[place] stabalized on BIPAP. hr 105)

## 2019-02-21 NOTE — Progress Notes (Signed)
[facility]  ROUNDING NOTE   Subjective:  Patient well-known to [place] as we follow him for outpatient hemodialysis. He presented earlier this a.m. with significant shortness of breath and significantly elevated blood pressure. He was started on dialysis this a.m. however shortly after initiation he became short of breath and had to be placed back on BiPAP. Patient then transition back to the intensive care unit.    Objective:  Vital signs in last 24 hours:  Temp:  [98.5 F (36.9 C)] 98.5 F (36.9 C) (05/02 0830) Pulse Rate:  [83-150] 101 (05/02 1100) Resp:  [12-30] 13 (05/02 1100) BP: (104-263)/(66-220) 160/92 (05/02 1100) SpO2:  [90 %-100 %] 97 % (05/02 1100)  Weight change:  There were no vitals filed for this visit.  Intake/Output: No intake/output data recorded.   Intake/Output this shift:  No intake/output data recorded.  Physical Exam: General:  Critically ill-appearing  Head: Normocephalic, atraumatic.  Eyes: Anicteric  Neck: Supple  Lungs:   Bilateral rhonchi and rales, increased work of breathing  Heart: Regular, no rubs  Abdomen:  Soft, nontender, bowel sounds present  Extremities: no peripheral edema.  Neurologic: Awake, alert, follows commands  Skin: No lesions      Access: left arm AVG +thrill and +bruit    Basic Metabolic Panel: Recent Labs  Lab 02/21/19 0256  NA 144  K 4.2  CL 102  CO2 25  GLUCOSE 170*  BUN 50*  CREATININE 6.51*  CALCIUM 7.9*    Liver Function Tests: Recent Labs  Lab 02/21/19 0256  AST 25  ALT 22  ALKPHOS 84  BILITOT 0.8  PROT 7.9  ALBUMIN 3.8   No results for input(s): LIPASE, AMYLASE in the last 168 hours. No results for input(s): AMMONIA in the last 168 hours.  CBC: Recent Labs  Lab 02/21/19 0256  WBC 14.6*  NEUTROABS 11.5*  HGB 10.0*  HCT 31.8*  MCV 99.7  PLT 296    Cardiac Enzymes: Recent Labs  Lab 02/21/19 0256  TROPONINI <0.03    BNP: Invalid input(s): POCBNP  CBG: Recent  Labs  Lab 02/21/19 2423  NTIRWE 31    Microbiology: Results for orders placed or performed during the hospital encounter of 02/21/19  SARS Coronavirus 2 [facility] order, Performed in [place] hospital lab)     Status: None   Collection Time: 02/21/19  2:49 AM  Result Value Ref Range Status   SARS Coronavirus 2 NEGATIVE NEGATIVE Final    Comment: (NOTE) If result is NEGATIVE SARS-CoV-2 target nucleic acids are NOT DETECTED. The SARS-CoV-2 RNA is generally detectable in upper and lower  respiratory specimens during the acute phase of infection. The lowest  concentration of SARS-CoV-2 viral copies this assay can detect is 250  copies / mL. A negative result does not preclude SARS-CoV-2 infection  and should not be used as the sole basis for treatment or other  patient management decisions.  A negative result may occur with  improper specimen collection / handling, submission of specimen other  than nasopharyngeal swab, presence of viral mutation(s) within the  areas targeted by this assay, and inadequate number of viral copies  (<250 copies / mL). A negative result must be combined with clinical  observations, patient history, and epidemiological information. If result is POSITIVE SARS-CoV-2 target nucleic acids are DETECTED. The SARS-CoV-2 RNA is generally detectable in upper and lower  respiratory specimens dur ing the acute phase of infection.  Positive  results are indicative of active infection with SARS-CoV-2.  Clinical  correlation with patient history and other diagnostic information is  necessary to determine patient infection status.  Positive results do  not rule out bacterial infection or co-infection with other viruses. If result is PRESUMPTIVE POSTIVE SARS-CoV-2 nucleic acids MAY BE PRESENT.   A presumptive positive result was obtained on the submitted specimen  and confirmed on repeat testing.  While 2019 novel coronavirus  (SARS-CoV-2) nucleic acids may be  present in the submitted sample  additional confirmatory testing may be necessary for epidemiological  and / or clinical management purposes  to differentiate between  SARS-CoV-2 and other Sarbecovirus currently known to infect humans.  If clinically indicated additional testing with an alternate test  methodology [phone]) is advised. The SARS-CoV-2 RNA is generally  detectable in upper and lower respiratory sp ecimens during the acute  phase of infection. The expected result is Negative. Fact Sheet for Patients:  StrictlyIdeas.no Fact Sheet for Healthcare Providers: BankingDealers.co.za This test is not yet approved or cleared by the [place] FDA and has been authorized for detection and/or diagnosis of SARS-CoV-2 by FDA under an Emergency Use Authorization (EUA).  This EUA will remain in effect (meaning this test can be used) for the duration of the COVID-19 declaration under Section 564(b)(1) of the Act, 21 U.S.C. section 360bbb-3(b)(1), unless the authorization is terminated or revoked sooner. Performed at [facility], [address] [place], [place] [address]     Coagulation Studies: No results for input(s): LABPROT, INR in the last 72 hours.  Urinalysis: No results for input(s): COLORURINE, LABSPEC, PHURINE, GLUCOSEU, HGBUR, BILIRUBINUR, KETONESUR, PROTEINUR, UROBILINOGEN, NITRITE, LEUKOCYTESUR in the last 72 hours.  Invalid input(s): APPERANCEUR    Imaging: Dg Chest Portable 1 View  Result Date: 02/21/2019 CLINICAL DATA:  Shortness of breath EXAM: PORTABLE CHEST 1 VIEW COMPARISON:  02/06/2019 FINDINGS: Cardiomegaly with vascular congestion and interstitial prominence compatible with interstitial edema. No visible effusions or acute bony abnormality. IMPRESSION: Cardiomegaly.  Mild interstitial edema. Electronically Signed   By: Rolm Baptise M.D.   On: 02/21/2019 03:21     Medications:   . ceFEPime  (MAXIPIME) IV     . azithromycin  500 mg Oral Daily  . Chlorhexidine Gluconate Cloth  6 each Topical Q0600  . docusate sodium  100 mg Oral BID  . hydrALAZINE  25 mg Oral Q8H  . labetalol      . labetalol  10 mg Intravenous Once  . sevelamer carbonate  1,600 mg Oral TID WC   acetaminophen **OR** acetaminophen, ondansetron **OR** ondansetron (ZOFRAN) IV  Assessment/ Plan:  Brett Small is a 43 y.o. white male with end stage renal disease on hemodialysis, hypertension, depression, aortobifemoral bypass, admission for depression 3/20, admission for respiratory failure 4/20, admission for acute respiratory failure 5/20.   [place] TTS schedule.   1.  ESRD: Patient in need of dialysis.  We had initiated dialysis treatment however he suddenly became short of breath.  Therefore we moved him back to the critical care unit on BiPAP.  He will be dialyzed at bedside as soon as he is transition to the critical care unit.  2.  Acute respiratory failure.  Secondary to pulmonary edema.  We will perform ultrafiltration with dialysis today.  3.  Anemia of chronic kidney disease.  Hemoglobin 10.0.  Continue to monitor hemoglobin closely.  5.  Secondary hyperparathyroidism.  Continue Renvela 1600 mg p.o. 3 times daily.   LOS: 0 Edwards Mckelvie 5/2/202011:07 AM

## 2019-02-21 NOTE — Significant Event (Signed)
Rapid Response Event Note  Overview:  Rapid response called by dialysis RN. Patient went into acute respiratory distress, sinus tachycardia, hypertensive and diaphoretic.    Initial Focused Assessment: Rales. See documented assessment.  Interventions: Placed on bi-pap and transferred back to CCU. Aleskerov awaiting at bedside to assess patient. 10mg  of IV labetolol administered.  Plan of Care (if not transferred):  Event Summary: Name of Physician Notified: Lateef at 1126    at [place]  Outcome: Transferred (Comment)(moved back up to CCU)  Event End Time: [place]

## 2019-02-22 ENCOUNTER — Other Ambulatory Visit: Payer: Self-pay

## 2019-02-22 LAB — CBC
HCT: 25.3 % — ABNORMAL LOW (ref 39.0–52.0)
Hemoglobin: 8.1 g/dL — ABNORMAL LOW (ref 13.0–17.0)
MCH: 30.9 pg (ref 26.0–34.0)
MCHC: 32 g/dL (ref 30.0–36.0)
MCV: 96.6 fL (ref 80.0–100.0)
Platelets: 139 10*3/uL — ABNORMAL LOW (ref 150–400)
RBC: 2.62 MIL/uL — ABNORMAL LOW (ref 4.22–5.81)
RDW: 15.5 % (ref 11.5–15.5)
WBC: 6.6 10*3/uL (ref 4.0–10.5)
nRBC: 0 % (ref 0.0–0.2)

## 2019-02-22 LAB — BASIC METABOLIC PANEL
Anion gap: 13 (ref 5–15)
BUN: 27 mg/dL — ABNORMAL HIGH (ref 6–20)
CO2: 30 mmol/L (ref 22–32)
Calcium: 8 mg/dL — ABNORMAL LOW (ref 8.9–10.3)
Chloride: 100 mmol/L (ref 98–111)
Creatinine, Ser: 4.4 mg/dL — ABNORMAL HIGH (ref 0.61–1.24)
GFR calc Af Amer: 17 mL/min — ABNORMAL LOW (ref >60)
GFR calc non Af Amer: 15 mL/min — ABNORMAL LOW (ref >60)
Glucose, Bld: 98 mg/dL (ref 70–99)
Potassium: 2.9 mmol/L — ABNORMAL LOW (ref 3.5–5.1)
Sodium: 143 mmol/L (ref 135–145)

## 2019-02-22 LAB — MAGNESIUM: Magnesium: 1.7 mg/dL (ref 1.7–2.4)

## 2019-02-22 LAB — PHOSPHORUS: Phosphorus: 4.1 mg/dL (ref 2.5–4.6)

## 2019-02-22 LAB — PROCALCITONIN: Procalcitonin: 0.69 ng/mL

## 2019-02-22 LAB — POTASSIUM: Potassium: 4.1 mmol/L (ref 3.5–5.1)

## 2019-02-22 LAB — TROPONIN I
Troponin I: <0.03 ng/mL
Troponin I: <0.03 ng/mL

## 2019-02-22 MED ORDER — GABAPENTIN 100 MG PO CAPS
100.0000 mg | ORAL_CAPSULE | Freq: Two times a day (BID) | ORAL | Status: DC
  Administered 2019-02-22 – 2019-02-24 (×4): 100 mg via ORAL
  Filled 2019-02-22 (×4): qty 1

## 2019-02-22 MED ORDER — LORATADINE 10 MG PO TABS
10.0000 mg | ORAL_TABLET | Freq: Every day | ORAL | Status: DC
  Administered 2019-02-22 – 2019-02-24 (×3): 10 mg via ORAL
  Filled 2019-02-22 (×3): qty 1

## 2019-02-22 MED ORDER — NITROGLYCERIN 0.4 MG SL SUBL
0.4000 mg | SUBLINGUAL_TABLET | SUBLINGUAL | Status: DC | PRN
  Administered 2019-02-22 – 2019-02-23 (×3): 0.4 mg via SUBLINGUAL
  Filled 2019-02-22 (×3): qty 1

## 2019-02-22 MED ORDER — AMLODIPINE BESYLATE 5 MG PO TABS
5.0000 mg | ORAL_TABLET | Freq: Every day | ORAL | Status: DC
  Administered 2019-02-22: 14:00:00 5 mg via ORAL
  Filled 2019-02-22: qty 1

## 2019-02-22 MED ORDER — POTASSIUM CHLORIDE 20 MEQ PO PACK
40.0000 meq | PACK | ORAL | Status: DC
  Filled 2019-02-22: qty 2

## 2019-02-22 MED ORDER — POTASSIUM CHLORIDE CRYS ER 20 MEQ PO TBCR
40.0000 meq | EXTENDED_RELEASE_TABLET | Freq: Four times a day (QID) | ORAL | Status: AC
  Administered 2019-02-22 (×4): 40 meq via ORAL
  Filled 2019-02-22 (×4): qty 2

## 2019-02-22 MED ORDER — CARVEDILOL 25 MG PO TABS
25.0000 mg | ORAL_TABLET | Freq: Two times a day (BID) | ORAL | Status: DC
  Administered 2019-02-22 – 2019-02-24 (×3): 25 mg via ORAL
  Filled 2019-02-22 (×4): qty 1

## 2019-02-22 NOTE — Progress Notes (Signed)
CRITICAL CARE NOTE      SUBJECTIVE FINDINGS & SIGNIFICANT EVENTS   Patient is clinically improved, transitioned off of the BiPAP.  No events overnight doing well eating renal diet.  PAST MEDICAL HISTORY   Past Medical History:  Diagnosis Date  . Hypertension   . Renal disorder      SURGICAL HISTORY   Past Surgical History:  Procedure Laterality Date  . A/V SHUNT INTERVENTION Left 01/19/2019   Procedure: LEFT UPPER EXTREMITY A/V SHUNTOGRAM / UPPER EXTREMITY ANGIOGRAM;  Surgeon: Algernon Huxley, MD;  Location: [facility];  Service: Cardiovascular;  Laterality: Left;  . AORTA - FEMORAL ARTERY BYPASS GRAFT    . AV FISTULA PLACEMENT Left 12/26/2018   Procedure: INSERTION OF GORE STRETCH VASCULAR 4-7MM X  45CM IN LEFT UPPER ARM;  Surgeon: Marty Heck, MD;  Location: [facility];  Service: Vascular;  Laterality: Left;  . DIALYSIS/PERMA CATHETER REMOVAL N/A 02/06/2019   Procedure: DIALYSIS/PERMA CATHETER REMOVAL;  Surgeon: Algernon Huxley, MD;  Location: [facility];  Service: Cardiovascular;  Laterality: N/A;     FAMILY HISTORY   Family History  Problem Relation Age of Onset  . Hypertension Other   . Diabetes Other   . Clotting disorder Father      SOCIAL HISTORY   Social History   Tobacco Use  . Smoking status: Former Smoker    Last attempt to quit: 06/26/2018    Years since quitting: 0.6  . Smokeless tobacco: Never Used  . Tobacco comment: smoked for 30 years   Substance Use Topics  . Alcohol use: Not Currently  . Drug use: Never     MEDICATIONS   Current Medication:  Current Facility-Administered Medications:  .  0.9 %  sodium chloride infusion, 100 mL, Intravenous, PRN, Lateef, Munsoor, MD .  0.9 %  sodium chloride infusion, 100 mL, Intravenous, PRN, Lateef, Munsoor, MD .   acetaminophen (TYLENOL) tablet 650 mg, 650 mg, Oral, Q6H PRN, 650 mg at 02/21/19 2127 **OR** acetaminophen (TYLENOL) suppository 650 mg, 650 mg, Rectal, Q6H PRN, Harrie Foreman, MD .  alteplase (CATHFLO ACTIVASE) injection 2 mg, 2 mg, Intracatheter, Once PRN, Lateef, Munsoor, MD .  azithromycin (ZITHROMAX) tablet 500 mg, 500 mg, Oral, Daily, Lanney Gins, Kylil Swopes, MD, 500 mg at 02/22/19 0840 .  [START ON 02/24/2019] ceFEPIme (MAXIPIME) 2 g in sodium chloride 0.9 % 100 mL IVPB, 2 g, Intravenous, Once per day on Tue Thu Sat, Kalisetti, Radhika, MD .  Chlorhexidine Gluconate Cloth 2 % PADS 6 each, 6 each, Topical, Q0600, Holley Raring, Munsoor, MD, 6 each at 02/22/19 0605 .  docusate sodium (COLACE) capsule 100 mg, 100 mg, Oral, BID, Harrie Foreman, MD, 100 mg at 02/22/19 0840 .  heparin injection 1,000 Units, 1,000 Units, Dialysis, PRN, Lateef, Munsoor, MD .  hydrALAZINE (APRESOLINE) tablet 25 mg, 25 mg, Oral, Q8H, Harrie Foreman, MD, 25 mg at 02/22/19 0601 .  lidocaine (PF) (XYLOCAINE) 1 % injection 5 mL, 5 mL, Intradermal, PRN, Lateef, Munsoor, MD .  lidocaine-prilocaine (EMLA) cream 1 application, 1 application, Topical, PRN, Lateef, Munsoor, MD .  ondansetron (ZOFRAN) tablet 4 mg, 4 mg, Oral, Q6H PRN **OR** ondansetron (ZOFRAN) injection 4 mg, 4 mg, Intravenous, Q6H PRN, Harrie Foreman, MD .  pentafluoroprop-tetrafluoroeth (GEBAUERS) aerosol 1 application, 1 application, Topical, PRN, Lateef, Munsoor, MD .  potassium chloride SA (K-DUR) CR tablet 40 mEq, 40 mEq, Oral, QID, Ottie Glazier, MD, 40 mEq at 02/22/19 0840 .  sevelamer carbonate (  RENVELA) tablet 1,600 mg, 1,600 mg, Oral, TID WC, Harrie Foreman, MD, 1,600 mg at 02/22/19 0841    ALLERGIES   Codeine and Sulfa antibiotics    REVIEW OF SYSTEMS     10 point ROS conducted and is negative except as per subjective findings  PHYSICAL EXAMINATION   Vitals:   02/22/19 1000 02/22/19 1100  BP: (!) 144/88   Pulse: (!) 110 (!) 110   Resp: 13 16  Temp:    SpO2: 95% 91%    GENERAL: No apparent distress HEAD: Normocephalic, atraumatic.  EYES: Pupils equal, round, reactive to light.  No scleral icterus.  MOUTH: Moist mucosal membrane. NECK: Supple. No thyromegaly. No nodules. No JVD.  PULMONARY: Mild crackles at the bases bilaterally CARDIOVASCULAR: S1 and S2. Regular rate and rhythm. No murmurs, rubs, or gallops.  GASTROINTESTINAL: Soft, nontender, non-distended. No masses. Positive bowel sounds. No hepatosplenomegaly.  MUSCULOSKELETAL: No swelling, clubbing, or edema.  NEUROLOGIC: Mild distress due to acute illness SKIN:intact,warm,dry   LABS AND IMAGING    LAB RESULTS: Recent Labs  Lab 02/21/19 0256 02/21/19 1010 02/22/19 0325  NA 144 144 143  K 4.2 3.5 2.9*  CL 102 107 100  CO2 25 23 30   BUN 50* 57* 27*  CREATININE 6.51* 6.86* 4.40*  GLUCOSE 170* 91 98   Recent Labs  Lab 02/21/19 0256 02/21/19 1010 02/22/19 0325  HGB 10.0* 7.5* 8.1*  HCT 31.8* 23.7* 25.3*  WBC 14.6* 7.9 6.6  PLT 296 183 139*     IMAGING RESULTS: No results found.    ASSESSMENT AND PLAN    -Multidisciplinary rounds held today  Acute Hypoxic Respiratory Failure                               Resolved        -Due to pulmonary edema secondary to ESRD status post RRT         -Status post BiPAP now transition to room air doing well         - will be optimizing for downgrade to medical floor today        ESRD on HD      -scheduling for HD   -follow chem 7 -follow UO -continue Foley Catheter-assess need daily   Tobacco abuse and MJ abuse      - nicotine replacement therapy - patches      - outpatient pulmonary follow up for smoking cessation and COPD - appt made at [facility] pulmonary.  -oxygen as needed ICU monitoring    Possible sepsis     -patient with elevated WBC count, acute hypoxemic, RLL infiltrate     - has been on vancomycin for past few weeks due to MSSA bacteremia     - repeat blood  cultures pending, PCT pending, resp culture pending      -empirically on cefepime, vanco, zithromax -use vasopressors to keep MAP>65 -follow ABG and LA -follow up cultures -emperic ABX -consider stress dose steroids   ID -continue IV abx as prescibed -follow up cultures  GI/Nutrition GI PROPHYLAXIS as indicated DIET-->TF's as tolerated Constipation protocol as indicated  ENDO - ICU hypoglycemic\Hyperglycemia protocol -check FSBS per protocol   ELECTROLYTES -follow labs as needed -replace as needed -pharmacy consultation   DVT/GI PRX ordered -SCDs  TRANSFUSIONS AS NEEDED MONITOR FSBS ASSESS the need for LABS as needed   Critical care provider statement:   Critical care time (minutes): 32  Critical care time was exclusive of: Separately billable procedures and treating other patients  Critical care was necessary to treat or prevent imminent or life-threatening deterioration of the following conditions: Acute hypoxemic respiratory failure, MSSA bacteremia, ESRD, multiple comorbid conditions  Critical care was time spent personally by me on the following activities: Development of treatment plan with patient or surrogate, discussions with consultants, evaluation of patient's response to treatment, examination of patient, obtaining history from patient or surrogate, ordering and performing treatments and interventions, ordering and review of laboratory studies and re-evaluation of patient's condition.  I assumed direction of critical care for this patient from another provider in my specialty: no    Ottie Glazier, M.D.  Division of [place]

## 2019-02-22 NOTE — Progress Notes (Signed)
Patfient responded well to the Nitro x1 and Tylenol.  States his chest feels better.  Tolerated his dinner.  No significant changes.  Continue to monitor.

## 2019-02-22 NOTE — Progress Notes (Signed)
[facility] [facility] [place] NAME: Brett Small    MR#:  [id number]  DATE OF BIRTH:  23-Oct-1975  SUBJECTIVE:  CHIEF COMPLAINT:   Chief Complaint  Patient presents with  . Shortness of Breath   - breathing is much better -Off BiPAP and on room air currently -Blood pressure is much better  REVIEW OF SYSTEMS:  Review of Systems  Constitutional: Negative for chills, fever and malaise/fatigue.  HENT: Negative for congestion, ear discharge, hearing loss and nosebleeds.   Eyes: Negative for blurred vision, double vision and discharge.  Respiratory: Positive for shortness of breath. Negative for cough and wheezing.   Cardiovascular: Negative for chest pain, palpitations and leg swelling.  Gastrointestinal: Negative for abdominal pain, constipation, diarrhea, nausea and vomiting.  Genitourinary: Negative for dysuria.  Musculoskeletal: Negative for myalgias.  Neurological: Negative for dizziness, focal weakness, seizures, weakness and headaches.  Psychiatric/Behavioral: Negative for depression.    DRUG ALLERGIES:   Allergies  Allergen Reactions  . Codeine Nausea Only    Patient questioned this (??)  . Sulfa Antibiotics Hives and Nausea And Vomiting    VITALS:  Blood pressure (!) 152/89, pulse (!) 110, temperature 99 F (37.2 C), temperature source Oral, resp. rate 11, height 5\' 2"  (1.575 m), weight 43.3 kg, SpO2 96 %.  PHYSICAL EXAMINATION:  Physical Exam   GENERAL:  43 y.o.-year-old patient lying in the bed with no acute distress.  EYES: Pupils equal, round, reactive to light and accommodation. No scleral icterus. Extraocular muscles intact.  HEENT: Head atraumatic, normocephalic. Oropharynx and nasopharynx clear.  NECK:  Supple, no jugular venous distention. No thyroid enlargement, no tenderness.  LUNGS: Normal breath sounds bilaterally, no wheezing, rales,rhonchi or crepitation. No use of accessory muscles of respiration.   CARDIOVASCULAR: S1, S2 normal. No  rubs, or gallops.  2/6 systolic murmur is present ABDOMEN: Soft, nontender, nondistended. Bowel sounds present. No organomegaly or mass.  EXTREMITIES: No pedal edema, cyanosis, or clubbing.  NEUROLOGIC: Cranial nerves II through XII are intact. Muscle strength 5/5 in all extremities. Sensation intact. Gait not checked.  PSYCHIATRIC: The patient is alert and oriented x 3.  SKIN: No obvious rash, lesion, or ulcer.    LABORATORY PANEL:   CBC Recent Labs  Lab 02/22/19 0325  WBC 6.6  HGB 8.1*  HCT 25.3*  PLT 139*   ------------------------------------------------------------------------------------------------------------------  Chemistries  Recent Labs  Lab 02/21/19 0256  02/22/19 0325  NA 144   < > 143  K 4.2   < > 2.9*  CL 102   < > 100  CO2 25   < > 30  GLUCOSE 170*   < > 98  BUN 50*   < > 27*  CREATININE 6.51*   < > 4.40*  CALCIUM 7.9*   < > 8.0*  MG  --   --  1.7  AST 25  --   --   ALT 22  --   --   ALKPHOS 84  --   --   BILITOT 0.8  --   --    < > = values in this interval not displayed.   ------------------------------------------------------------------------------------------------------------------  Cardiac Enzymes Recent Labs  Lab 02/21/19 0256  TROPONINI <0.03   ------------------------------------------------------------------------------------------------------------------  RADIOLOGY:  Dg Chest Portable 1 View  Result Date: 02/21/2019 CLINICAL DATA:  Shortness of breath EXAM: PORTABLE CHEST 1 VIEW COMPARISON:  02/06/2019 FINDINGS: Cardiomegaly with vascular congestion and interstitial prominence compatible with interstitial edema. No visible effusions or  acute bony abnormality. IMPRESSION: Cardiomegaly.  Mild interstitial edema. Electronically Signed   By: Rolm Baptise M.D.   On: 02/21/2019 03:21    EKG:   Orders placed or performed during the hospital encounter of 02/21/19  . EKG 12-Lead  . EKG 12-Lead  . EKG  12-Lead  . EKG 12-Lead    ASSESSMENT AND PLAN:   43 year old male with past medical history significant for end-stage renal disease on Tuesday Thursday Saturday hemodialysis, hypertension admitted to hospital secondary to acute hypoxic respiratory failure.  1.  Acute hypoxic respiratory failure-secondary to flash pulmonary edema. -Admitted to stepdown for BiPAP -Weaned off BiPAP, currently on room air and breathing much better -COVID-19 test is negative -Concern for underlying pneumonitis/pneumonia.  Procalcitonin is elevated -On cefepime and azithromycin  2.  End-stage renal disease on hemodialysis-was admitted 2 weeks ago with similar complaints when he missed dialysis.  However this time patient has been compliant with dialysis but not taking his blood pressure medications. -Dialyzed yesterday per schedule.  Next dialysis this coming Tuesday. -Appreciate nephrology consult.  3.  Hypokalemia-being replaced  4.  Hypertension-better controlled today.  Patient on oral hydralazine, -We will labetalol as needed  5.  DVT prophylaxis-subcutaneous heparin  Patient lives in a boarding home.  Independent at baseline   All the records are reviewed and case discussed with Care Management/Social Workerr. Management plans discussed with the patient, family and they are in agreement.  CODE STATUS: Full Code  TOTAL TIME TAKING CARE OF THIS PATIENT: 38 minutes.   POSSIBLE D/C IN 2 DAYS, DEPENDING ON CLINICAL CONDITION.   Gladstone Lighter M.D on 02/22/2019 at 9:53 AM  Between 7am to 6pm - Pager - [phone]  After 6pm go to [email or web address] - password [email or web address] [facility]  Office  [phone]  CC: Primary care physician; Patient, [name]

## 2019-02-22 NOTE — Progress Notes (Signed)
NP notified of patients potassium level at 2.9 and magnesium level at 1.7, acknowledged. Awaiting orders.

## 2019-02-22 NOTE — Progress Notes (Signed)
[facility]  ROUNDING NOTE   Subjective:  Patient doing much better today. Underwent urgent dialysis yesterday. Blood pressure under much better control today as well.   Objective:  Vital signs in last 24 hours:  Temp:  [98.5 F (36.9 C)-100.1 F (37.8 C)] 98.7 F (37.1 C) (05/03 0800) Pulse Rate:  [96-123] 110 (05/03 1100) Resp:  [11-27] 16 (05/03 1100) BP: (108-168)/(63-99) 144/88 (05/03 1000) SpO2:  [91 %-100 %] 91 % (05/03 1100) Weight:  [43.3 kg] 43.3 kg (05/03 0500)  Weight change:  Filed Weights   02/21/19 0945 02/21/19 1420 02/22/19 0500  Weight: 46 kg 43.3 kg 43.3 kg    Intake/Output: I/O last 3 completed shifts: In: 100 [IV Piggyback:100] Out: 1696 [Other:1750]   Intake/Output this shift:  Total I/O In: 240 [P.O.:240] Out: 100 [Urine:100]  Physical Exam: General:  No acute distress  Head: Normocephalic, atraumatic.  Eyes: Anicteric  Neck: Supple  Lungs:  Scattered rhonchi  Heart: Regular, no rubs  Abdomen:  Soft, nontender, bowel sounds present  Extremities: no peripheral edema.  Neurologic: Awake, alert, follows commands  Skin: No lesions      Access: left arm AVG +thrill and +bruit    Basic Metabolic Panel: Recent Labs  Lab 02/21/19 0256 02/21/19 1010 02/22/19 0325  NA 144 144 143  K 4.2 3.5 2.9*  CL 102 107 100  CO2 25 23 30   GLUCOSE 170* 91 98  BUN 50* 57* 27*  CREATININE 6.51* 6.86* 4.40*  CALCIUM 7.9* 7.6* 8.0*  MG  --   --  1.7  PHOS  --  5.3* 4.1    Liver Function Tests: Recent Labs  Lab 02/21/19 0256 02/21/19 1010  AST 25  --   ALT 22  --   ALKPHOS 84  --   BILITOT 0.8  --   PROT 7.9  --   ALBUMIN 3.8 2.9*   No results for input(s): LIPASE, AMYLASE in the last 168 hours. No results for input(s): AMMONIA in the last 168 hours.  CBC: Recent Labs  Lab 02/21/19 0256 02/21/19 1010 02/22/19 0325  WBC 14.6* 7.9 6.6  NEUTROABS 11.5*  --   --   HGB 10.0* 7.5* 8.1*  HCT 31.8* 23.7* 25.3*  MCV 99.7  96.7 96.6  PLT 296 183 139*    Cardiac Enzymes: Recent Labs  Lab 02/21/19 0256  TROPONINI <0.03    BNP: Invalid input(s): POCBNP  CBG: Recent Labs  Lab 02/21/19 7893  YBOFBP 10    Microbiology: Results for orders placed or performed during the hospital encounter of 02/21/19  SARS Coronavirus 2 [facility] order, Performed in [place] hospital lab)     Status: None   Collection Time: 02/21/19  2:49 AM  Result Value Ref Range Status   SARS Coronavirus 2 NEGATIVE NEGATIVE Final    Comment: (NOTE) If result is NEGATIVE SARS-CoV-2 target nucleic acids are NOT DETECTED. The SARS-CoV-2 RNA is generally detectable in upper and lower  respiratory specimens during the acute phase of infection. The lowest  concentration of SARS-CoV-2 viral copies this assay can detect is 250  copies / mL. A negative result does not preclude SARS-CoV-2 infection  and should not be used as the sole basis for treatment or other  patient management decisions.  A negative result may occur with  improper specimen collection / handling, submission of specimen other  than nasopharyngeal swab, presence of viral mutation(s) within the  areas targeted by this assay, and inadequate number of viral copies  (<  250 copies / mL). A negative result must be combined with clinical  observations, patient history, and epidemiological information. If result is POSITIVE SARS-CoV-2 target nucleic acids are DETECTED. The SARS-CoV-2 RNA is generally detectable in upper and lower  respiratory specimens dur ing the acute phase of infection.  Positive  results are indicative of active infection with SARS-CoV-2.  Clinical  correlation with patient history and other diagnostic information is  necessary to determine patient infection status.  Positive results do  not rule out bacterial infection or co-infection with other viruses. If result is PRESUMPTIVE POSTIVE SARS-CoV-2 nucleic acids MAY BE PRESENT.   A presumptive  positive result was obtained on the submitted specimen  and confirmed on repeat testing.  While 2019 novel coronavirus  (SARS-CoV-2) nucleic acids may be present in the submitted sample  additional confirmatory testing may be necessary for epidemiological  and / or clinical management purposes  to differentiate between  SARS-CoV-2 and other Sarbecovirus currently known to infect humans.  If clinically indicated additional testing with an alternate test  methodology [phone]) is advised. The SARS-CoV-2 RNA is generally  detectable in upper and lower respiratory sp ecimens during the acute  phase of infection. The expected result is Negative. Fact Sheet for Patients:  StrictlyIdeas.no Fact Sheet for Healthcare Providers: BankingDealers.co.za This test is not yet approved or cleared by the [place] FDA and has been authorized for detection and/or diagnosis of SARS-CoV-2 by FDA under an Emergency Use Authorization (EUA).  This EUA will remain in effect (meaning this test can be used) for the duration of the COVID-19 declaration under Section 564(b)(1) of the Act, 21 U.S.C. section 360bbb-3(b)(1), unless the authorization is terminated or revoked sooner. Performed at [facility], [address] [place], [place] [address]   CULTURE, BLOOD (ROUTINE X 2) w Reflex to ID Panel     Status: None (Preliminary result)   Collection Time: 02/21/19 10:06 AM  Result Value Ref Range Status   Specimen Description BLOOD R HAND  Final   Special Requests   Final    BOTTLES DRAWN AEROBIC AND ANAEROBIC Blood Culture adequate volume   Culture   Final    NO GROWTH < 24 HOURS Performed at [facility], [address], [place], [place] [address]    Report Status PENDING  Incomplete  CULTURE, BLOOD (ROUTINE X 2) w Reflex to ID Panel     Status: None (Preliminary result)   Collection Time: 02/21/19 10:10 AM  Result Value Ref Range Status    Specimen Description BLOOD DBP  Final   Special Requests   Final    BOTTLES DRAWN AEROBIC AND ANAEROBIC Blood Culture results may not be optimal due to an inadequate volume of blood received in culture bottles   Culture   Final    NO GROWTH < 24 HOURS Performed at [facility], [address] [place], [place] [address]    Report Status PENDING  Incomplete    Coagulation Studies: No results for input(s): LABPROT, INR in the last 72 hours.  Urinalysis: No results for input(s): COLORURINE, LABSPEC, PHURINE, GLUCOSEU, HGBUR, BILIRUBINUR, KETONESUR, PROTEINUR, UROBILINOGEN, NITRITE, LEUKOCYTESUR in the last 72 hours.  Invalid input(s): APPERANCEUR    Imaging: Dg Chest Portable 1 View  Result Date: 02/21/2019 CLINICAL DATA:  Shortness of breath EXAM: PORTABLE CHEST 1 VIEW COMPARISON:  02/06/2019 FINDINGS: Cardiomegaly with vascular congestion and interstitial prominence compatible with interstitial edema. No visible effusions or acute bony abnormality. IMPRESSION: Cardiomegaly.  Mild interstitial  edema. Electronically Signed   By: Rolm Baptise M.D.   On: 02/21/2019 03:21     Medications:   . sodium chloride    . sodium chloride    . [START ON 02/24/2019] ceFEPime (MAXIPIME) IV     . azithromycin  500 mg Oral Daily  . Chlorhexidine Gluconate Cloth  6 each Topical Q0600  . docusate sodium  100 mg Oral BID  . hydrALAZINE  25 mg Oral Q8H  . potassium chloride  40 mEq Oral QID  . sevelamer carbonate  1,600 mg Oral TID WC   sodium chloride, sodium chloride, acetaminophen **OR** acetaminophen, alteplase, heparin, lidocaine (PF), lidocaine-prilocaine, ondansetron **OR** ondansetron (ZOFRAN) IV, pentafluoroprop-tetrafluoroeth  Assessment/ Plan:  [name] is a 43 y.o. white male with end stage renal disease on hemodialysis, hypertension, depression, aortobifemoral bypass, admission for depression 3/20, admission for respiratory failure 4/20, admission for acute  respiratory failure 5/20.   [place] TTS schedule.   1.  ESRD: Patient underwent urgent dialysis treatment yesterday.  No acute indication for dialysis treatment today.  We will plan for dialysis again on Tuesday as an outpatient.  2.  Acute respiratory failure.  Secondary to pulmonary edema.  Patient completed dialysis yesterday and pulmonary edema was effectively treated.  3.  Anemia of chronic kidney disease.  Continue to monitor CBC as an outpatient.  5.  Secondary hyperparathyroidism.  Continue Renvela 1600 mg p.o. 3 times daily.  6.  Hypertension.  Add back amlodipine 5 mg daily, carvedilol 25 mg twice daily.   LOS: 1 Brett Small 5/3/202012:48 PM

## 2019-02-22 NOTE — Progress Notes (Signed)
Spoke to Dr. Tressia Miners about patient having some chest tightness and shortness of breath.  Lungs diminished but clear.  O2 = 99% on 3L.  She acknowledged and gave verbal order for Nitro, EKG and Troponins.  I acknowledged.

## 2019-02-22 NOTE — Progress Notes (Signed)
Pt has remained alert and oriented with no c/o of pain. Pt has remained on 2LNC, SpO2 > 90%, lung sounds clear/diminished to auscultation. Pt has remained in ST (1-teens) on cardiac monitor. Pt with orders to transfer to [facility], report given to [place], Therapist, sports.

## 2019-02-23 LAB — BASIC METABOLIC PANEL
Anion gap: 12 (ref 5–15)
BUN: 47 mg/dL — ABNORMAL HIGH (ref 6–20)
CO2: 22 mmol/L (ref 22–32)
Calcium: 8.6 mg/dL — ABNORMAL LOW (ref 8.9–10.3)
Chloride: 105 mmol/L (ref 98–111)
Creatinine, Ser: 6.28 mg/dL — ABNORMAL HIGH (ref 0.61–1.24)
GFR calc Af Amer: 11 mL/min — ABNORMAL LOW (ref >60)
GFR calc non Af Amer: 10 mL/min — ABNORMAL LOW (ref >60)
Glucose, Bld: 116 mg/dL — ABNORMAL HIGH (ref 70–99)
Potassium: 6.4 mmol/L (ref 3.5–5.1)
Sodium: 139 mmol/L (ref 135–145)

## 2019-02-23 LAB — TROPONIN I: Troponin I: <0.03 ng/mL (ref <0.03)

## 2019-02-23 LAB — PHOSPHORUS: Phosphorus: 3.2 mg/dL (ref 2.5–4.6)

## 2019-02-23 LAB — PROCALCITONIN: Procalcitonin: 0.62 ng/mL

## 2019-02-23 MED ORDER — EPOETIN ALFA 10000 UNIT/ML IJ SOLN: 10000.0000 [IU] | INTRAMUSCULAR | Status: DC

## 2019-02-23 MED ORDER — HYDROXYZINE HCL 25 MG PO TABS
25.0000 mg | ORAL_TABLET | Freq: Three times a day (TID) | ORAL | Status: DC | PRN
  Filled 2019-02-23: qty 1

## 2019-02-23 MED ORDER — PROMETHAZINE HCL 25 MG/ML IJ SOLN
12.5000 mg | Freq: Once | INTRAMUSCULAR | Status: AC
  Administered 2019-02-23: 09:00:00 12.5 mg via INTRAVENOUS
  Filled 2019-02-23: qty 1

## 2019-02-23 MED ORDER — SODIUM ZIRCONIUM CYCLOSILICATE 5 G PO PACK
5.0000 g | PACK | Freq: Every day | ORAL | Status: DC
  Administered 2019-02-24: 14:00:00 5 g via ORAL
  Filled 2019-02-23 (×2): qty 1

## 2019-02-23 MED ORDER — NEPRO/CARBSTEADY PO LIQD
237.0000 mL | Freq: Two times a day (BID) | ORAL | Status: DC
  Administered 2019-02-24: 237 mL via ORAL

## 2019-02-23 MED ORDER — AMLODIPINE BESYLATE 10 MG PO TABS
10.0000 mg | ORAL_TABLET | Freq: Every day | ORAL | Status: DC
  Administered 2019-02-24: 14:00:00 10 mg via ORAL
  Filled 2019-02-23: qty 1

## 2019-02-23 MED ORDER — ASPIRIN EC 81 MG PO TBEC
81.0000 mg | DELAYED_RELEASE_TABLET | Freq: Every day | ORAL | Status: DC
  Administered 2019-02-23 – 2019-02-24 (×2): 81 mg via ORAL
  Filled 2019-02-23 (×2): qty 1

## 2019-02-23 MED ORDER — APIXABAN 2.5 MG PO TABS
2.5000 mg | ORAL_TABLET | Freq: Two times a day (BID) | ORAL | Status: DC
  Administered 2019-02-23 – 2019-02-24 (×2): 2.5 mg via ORAL
  Filled 2019-02-23 (×2): qty 1

## 2019-02-23 MED ORDER — SODIUM CHLORIDE 0.9 % IV SOLN
INTRAVENOUS | Status: DC | PRN
  Administered 2019-02-23: 09:00:00 250 mL via INTRAVENOUS

## 2019-02-23 MED ORDER — CALCIUM GLUCONATE-NACL 1-0.675 GM/50ML-% IV SOLN
1.0000 g | Freq: Once | INTRAVENOUS | Status: AC
  Administered 2019-02-23: 09:00:00 1000 mg via INTRAVENOUS
  Filled 2019-02-23: qty 50

## 2019-02-23 MED ORDER — ATORVASTATIN CALCIUM 20 MG PO TABS
80.0000 mg | ORAL_TABLET | Freq: Every day | ORAL | Status: DC
  Administered 2019-02-23: 23:00:00 80 mg via ORAL
  Filled 2019-02-23: qty 4

## 2019-02-23 MED ORDER — RENA-VITE PO TABS
1.0000 | ORAL_TABLET | Freq: Every day | ORAL | Status: DC
  Administered 2019-02-24: 14:00:00 1 via ORAL
  Filled 2019-02-23: qty 1

## 2019-02-23 NOTE — Progress Notes (Signed)
HD Tx Start   02/23/19 0950  Vital Signs  Pulse Rate 100  Resp 10  BP (!) 138/99  Oxygen Therapy  SpO2 98 %  During Hemodialysis Assessment  Blood Flow Rate (mL/min) 350 mL/min  Arterial Pressure (mmHg) -180 mmHg  Venous Pressure (mmHg) 190 mmHg  Transmembrane Pressure (mmHg) 40 mmHg  Ultrafiltration Rate (mL/min) 500 mL/min (500 mL per HOUR removal)  Dialysate Flow Rate (mL/min) 800 ml/min  Conductivity: Machine  14  HD Safety Checks Performed Yes  Dialysis Fluid Bolus Normal Saline  Bolus Amount (mL) 250 mL  Intra-Hemodialysis Comments Tx initiated

## 2019-02-23 NOTE — Progress Notes (Signed)
Pre HD Assessment    02/23/19 0915  Neurological  Level of Consciousness Alert  Orientation Level Oriented X4  Respiratory  Respiratory Pattern Regular  Chest Assessment Chest expansion symmetrical  Bilateral Breath Sounds Diminished;Fine crackles  Cough Non-productive;Congested  Cardiac  Pulse Regular  Heart Sounds S1, S2  Jugular Venous Distention (JVD) No  ECG Monitor Yes  Cardiac Rhythm ST  Vascular  R Radial Pulse +2  L Radial Pulse +2  Integumentary  Integumentary (WDL) X  Skin Color Appropriate for ethnicity  Skin Condition Dry  Weeping Location Ankle  Weeping Location Orientation Right  Additional Integumentary Comments  (HD pt)  Musculoskeletal  Musculoskeletal (WDL) X  Generalized Weakness Yes  Gastrointestinal  Bowel Sounds Assessment Active  GU Assessment  Genitourinary (WDL) X  Genitourinary Symptoms Oliguria  Psychosocial  Psychosocial (WDL) WDL  Emotional support given Given to patient

## 2019-02-23 NOTE — Progress Notes (Signed)
[facility]  ROUNDING NOTE   Subjective:   K 6.4 - extra hemodialysis treatment today due to hyperkalemia. Was given Potassium chloride 59mEq four times yesterday.   Seen and examined on hemodialysis treatment. 2K bath. UF goal of 1 liter. Tolerating treatment well.     HEMODIALYSIS FLOWSHEET:  Blood Flow Rate (mL/min): 350 mL/min Arterial Pressure (mmHg): -190 mmHg Venous Pressure (mmHg): 200 mmHg Transmembrane Pressure (mmHg): 40 mmHg Ultrafiltration Rate (mL/min): 500 mL/min Dialysate Flow Rate (mL/min): 800 ml/min Conductivity: Machine : 14 Conductivity: Machine : 14 Dialysis Fluid Bolus: Normal Saline Bolus Amount (mL): 250 mL  .    Objective:  Vital signs in last 24 hours:  Temp:  [97.9 F (36.6 C)-98.7 F (37.1 C)] 98.4 F (36.9 C) (05/04 0940) Pulse Rate:  [96-119] 101 (05/04 1130) Resp:  [9-23] 23 (05/04 1130) BP: (112-156)/(71-106) 146/87 (05/04 1130) SpO2:  [93 %-100 %] 96 % (05/04 1130) Weight:  [42.7 kg] 42.7 kg (05/04 0940)  Weight change: -3.3 kg Filed Weights   02/22/19 0500 02/23/19 0500 02/23/19 0940  Weight: 43.3 kg 42.7 kg 42.7 kg    Intake/Output: I/O last 3 completed shifts: In: 600 [P.O.:600] Out: 100 [Urine:100]   Intake/Output this shift:  No intake/output data recorded.  Physical Exam: General:  No acute distress  Head: Normocephalic, atraumatic.  Eyes: Anicteric  Neck: Supple  Lungs:  clear  Heart: Regular, no rubs  Abdomen:  Soft, nontender, bowel sounds present  Extremities: no peripheral edema.  Neurologic: Awake, alert, follows commands  Skin: No lesions  Access: left arm AVG      Basic Metabolic Panel: Recent Labs  Lab 02/21/19 0256 02/21/19 1010 02/22/19 0325 02/22/19 1241 02/23/19 0401 02/23/19 1018  NA 144 144 143  --  139  --   K 4.2 3.5 2.9* 4.1 6.4*  --   CL 102 107 100  --  105  --   CO2 25 23 30   --  22  --   GLUCOSE 170* 91 98  --  116*  --   BUN 50* 57* 27*  --  47*  --   CREATININE  6.51* 6.86* 4.40*  --  6.28*  --   CALCIUM 7.9* 7.6* 8.0*  --  8.6*  --   MG  --   --  1.7  --   --   --   PHOS  --  5.3* 4.1  --   --  3.2    Liver Function Tests: Recent Labs  Lab 02/21/19 0256 02/21/19 1010  AST 25  --   ALT 22  --   ALKPHOS 84  --   BILITOT 0.8  --   PROT 7.9  --   ALBUMIN 3.8 2.9*   No results for input(s): LIPASE, AMYLASE in the last 168 hours. No results for input(s): AMMONIA in the last 168 hours.  CBC: Recent Labs  Lab 02/21/19 0256 02/21/19 1010 02/22/19 0325  WBC 14.6* 7.9 6.6  NEUTROABS 11.5*  --   --   HGB 10.0* 7.5* 8.1*  HCT 31.8* 23.7* 25.3*  MCV 99.7 96.7 96.6  PLT 296 183 139*    Cardiac Enzymes: Recent Labs  Lab 02/21/19 0256 02/22/19 1605 02/22/19 2151 02/23/19 0401  TROPONINI <0.03 <0.03 <0.03 <0.03    BNP: Invalid input(s): POCBNP  CBG: Recent Labs  Lab 02/21/19 0834  GLUCAP 65    Microbiology: Results for orders placed or performed during the hospital encounter of 02/21/19  SARS Coronavirus 2 (  Hospital order, Performed in [facility] hospital lab)     Status: None   Collection Time: 02/21/19  2:49 AM  Result Value Ref Range Status   SARS Coronavirus 2 NEGATIVE NEGATIVE Final    Comment: (NOTE) If result is NEGATIVE SARS-CoV-2 target nucleic acids are NOT DETECTED. The SARS-CoV-2 RNA is generally detectable in upper and lower  respiratory specimens during the acute phase of infection. The lowest  concentration of SARS-CoV-2 viral copies this assay can detect is 250  copies / mL. A negative result does not preclude SARS-CoV-2 infection  and should not be used as the sole basis for treatment or other  patient management decisions.  A negative result may occur with  improper specimen collection / handling, submission of specimen other  than nasopharyngeal swab, presence of viral mutation(s) within the  areas targeted by this assay, and inadequate number of viral copies  (<250 copies / mL). A negative result  must be combined with clinical  observations, patient history, and epidemiological information. If result is POSITIVE SARS-CoV-2 target nucleic acids are DETECTED. The SARS-CoV-2 RNA is generally detectable in upper and lower  respiratory specimens dur ing the acute phase of infection.  Positive  results are indicative of active infection with SARS-CoV-2.  Clinical  correlation with patient history and other diagnostic information is  necessary to determine patient infection status.  Positive results do  not rule out bacterial infection or co-infection with other viruses. If result is PRESUMPTIVE POSTIVE SARS-CoV-2 nucleic acids MAY BE PRESENT.   A presumptive positive result was obtained on the submitted specimen  and confirmed on repeat testing.  While 2019 novel coronavirus  (SARS-CoV-2) nucleic acids may be present in the submitted sample  additional confirmatory testing may be necessary for epidemiological  and / or clinical management purposes  to differentiate between  SARS-CoV-2 and other Sarbecovirus currently known to infect humans.  If clinically indicated additional testing with an alternate test  methodology [phone]) is advised. The SARS-CoV-2 RNA is generally  detectable in upper and lower respiratory sp ecimens during the acute  phase of infection. The expected result is Negative. Fact Sheet for Patients:  StrictlyIdeas.no Fact Sheet for Healthcare Providers: BankingDealers.co.za This test is not yet approved or cleared by the [place] FDA and has been authorized for detection and/or diagnosis of SARS-CoV-2 by FDA under an Emergency Use Authorization (EUA).  This EUA will remain in effect (meaning this test can be used) for the duration of the COVID-19 declaration under Section 564(b)(1) of the Act, 21 U.S.C. section 360bbb-3(b)(1), unless the authorization is terminated or revoked sooner. Performed at [facility] [address] [place], [place] [address]   CULTURE, BLOOD (ROUTINE X 2) w Reflex to ID Panel     Status: None (Preliminary result)   Collection Time: 02/21/19 10:06 AM  Result Value Ref Range Status   Specimen Description BLOOD R HAND  Final   Special Requests   Final    BOTTLES DRAWN AEROBIC AND ANAEROBIC Blood Culture adequate volume   Culture   Final    NO GROWTH 2 DAYS Performed at [facility], [address] [place], [place] [address]    Report Status PENDING  Incomplete  CULTURE, BLOOD (ROUTINE X 2) w Reflex to ID Panel     Status: None (Preliminary result)   Collection Time: 02/21/19 10:10 AM  Result Value Ref Range Status   Specimen Description BLOOD DBP  Final   Special Requests  Final    BOTTLES DRAWN AEROBIC AND ANAEROBIC Blood Culture results may not be optimal due to an inadequate volume of blood received in culture bottles   Culture   Final    NO GROWTH 2 DAYS Performed at [facility], [address] [place], [place] [address]    Report Status PENDING  Incomplete    Coagulation Studies: No results for input(s): LABPROT, INR in the last 72 hours.  Urinalysis: No results for input(s): COLORURINE, LABSPEC, PHURINE, GLUCOSEU, HGBUR, BILIRUBINUR, KETONESUR, PROTEINUR, UROBILINOGEN, NITRITE, LEUKOCYTESUR in the last 72 hours.  Invalid input(s): APPERANCEUR    Imaging: No results found.   Medications:   . sodium chloride    . sodium chloride    . sodium chloride 250 mL (02/23/19 0832)  . [START ON 02/24/2019] ceFEPime (MAXIPIME) IV     . amLODipine  10 mg Oral Q2000  . apixaban  2.5 mg Oral BID  . aspirin EC  81 mg Oral Daily  . atorvastatin  80 mg Oral QHS  . azithromycin  500 mg Oral Daily  . carvedilol  25 mg Oral BID WC  . Chlorhexidine Gluconate Cloth  6 each Topical Q0600  . docusate sodium  100 mg Oral BID  . feeding supplement (NEPRO CARB STEADY)  237 mL Oral BID BM  . gabapentin  100 mg Oral BID  .  hydrALAZINE  25 mg Oral Q8H  . loratadine  10 mg Oral Daily  . multivitamin  1 tablet Oral Daily  . sevelamer carbonate  1,600 mg Oral TID WC  . sodium zirconium cyclosilicate  5 g Oral Daily   sodium chloride, sodium chloride, sodium chloride, acetaminophen **OR** acetaminophen, alteplase, heparin, hydrOXYzine, lidocaine (PF), lidocaine-prilocaine, nitroGLYCERIN, ondansetron **OR** ondansetron (ZOFRAN) IV, pentafluoroprop-tetrafluoroeth  Assessment/ Plan:  Brett Small is a 43 y.o. white male with end stage renal disease on hemodialysis, hypertension, depression, aortobifemoral bypass  [place]. TTS left AVG 46.5kg 2K bath  1.  End stage renal disease on hemodialysis with hyperkalemia: seems hyperkalemia is iatrogenic. Discontinue PO potassium supplements - Low K diet - Seen and examined on emergent hemodialysis treatment. Shortened hemodialysis treatment scheduled for tomorrow - [place]  2.  Acute respiratory failure.  Secondary to pulmonary edema.   - Fluid restriction - Not currently on diuretic. May need to start furosemide before discharge.   3.  Anemia of chronic kidney disease: hemoglobin 8.1 - EPO with TTS HD treatments.  - IV iron as outpatient.   4.  Secondary hyperparathyroidism. Phosphorus and calcium at goal. Labs from 4/23 PTH 342 - at goal.  - Continue sevelamer 1600 mg p.o. 3 times daily.  5.  Hypertension: well controlled today. Current regimen of amlodipine, hydralazine and carvedilol.    LOS: 2 Endiya Klahr 5/4/202012:11 PM

## 2019-02-23 NOTE — Progress Notes (Signed)
Pre HD Tx     02/23/19 0940  Hand-Off documentation  Report given to (Full Name) Beatris Ship RN  Report received from (Full Name) Deniece Ree RN  Vital Signs  Temp 98.4 F (36.9 C)  Temp Source Oral  Pulse Rate 100  Pulse Rate Source Monitor  Resp (!) 9  BP 131/89  BP Location Right Leg  BP Method Automatic  Patient Position (if appropriate) Lying  Oxygen Therapy  SpO2 96 %  O2 Device Room Air  Pain Assessment  Pain Scale 0-10  Pain Score 0  Dialysis Weight  Weight 42.7 kg  Type of Weight Pre-Dialysis  Time-Out for Hemodialysis  What Procedure? Hemodialysis   Pt Identifiers(min of two) First/Last Name;MRN/Account#  Correct Site? Yes  Correct Side? Yes  Correct Procedure? Yes  Consents Verified? Yes  Rad Studies Available? N/A  Safety Precautions Reviewed? Yes  Engineer, civil (consulting) Number 7  Station Number 1  UF/Alarm Test Passed  Conductivity: Meter 14  Conductivity: Machine  14  pH 7.4  Reverse Osmosis Main   Normal Saline Lot Number [id number]  Dialyzer Lot Number [id number]  Disposable Set Lot Number [id number]  Machine Temperature 98.6 F (37 C)  Musician and Audible Yes  Blood Lines Intact and Secured Yes  Pre Treatment Patient Checks  Vascular access used during treatment Graft  Hepatitis B Surface Antigen Results Negative  Date Hepatitis B Surface Antigen Drawn 01/24/19  Isolation Initiated Yes  Hepatitis B Surface Antibody  (<10)  Date Hepatitis B Surface Antibody Drawn 01/24/19  Hemodialysis Consent Verified Yes  Hemodialysis Standing Orders Initiated Yes  ECG (Telemetry) Monitor On Yes  Prime Ordered Normal Saline  Length of  DialysisTreatment -hour(s) 3 Hour(s)  Dialysis Treatment Comments Na140  Dialyzer Elisio 17H NR  Dialysate 2K, 2.5 Ca  Dialysate Flow Ordered 800  Blood Flow Rate Ordered 350 mL/min (16G needles used, new graft, MD aware)  Ultrafiltration Goal 1 Liters  Dialysis Blood Pressure Support Ordered Normal Saline   Education / Care Plan  Dialysis Education Provided Yes  Documented Education in Care Plan Yes  Fistula / Graft Left Upper arm Arteriovenous vein graft  Placement Date/Time: 12/26/18 1024   Placed prior to admission: No  Orientation: Left  Access Location: Upper arm  Access Type: (c) Arteriovenous vein graft  Site Condition No complications  Fistula / Graft Assessment Present;Thrill;Bruit  Status Accessed  Needle Size 16  Drainage Description None

## 2019-02-23 NOTE — Progress Notes (Signed)
Post HD Tx 1.2 kg removal. Due for scheduled HD tmro. Today only for K+ elevation.   02/23/19 1347  Hand-Off documentation  Report given to (Full Name) Barnabas Lister Daughetry RN  Report received from (Full Name) Trellis Paganini RN  Vital Signs  Temp 98.8 F (37.1 C)  Temp Source Oral  Pulse Rate (!) 101  Pulse Rate Source Monitor  Resp (!) 23  BP 136/81  BP Location Left Leg  BP Method Automatic  Patient Position (if appropriate) Lying  Oxygen Therapy  SpO2 96 %  O2 Device Room Air  Pain Assessment  Pain Scale 0-10  Pain Score 0  Dialysis Weight  Weight 41.5 kg  Type of Weight Post-Dialysis  Post-Hemodialysis Assessment  Rinseback Volume (mL) 250 mL  Dialyzer Clearance Lightly streaked  Duration of HD Treatment -hour(s) 3 hour(s)  Hemodialysis Intake (mL) 1000 mL (d/t mid-tx system clotting. )  UF Total -Machine (mL) 2200 mL  Net UF (mL) 1200 mL  Tolerated HD Treatment Yes  AVG/AVF Arterial Site Held (minutes) 10 minutes  AVG/AVF Venous Site Held (minutes) 10 minutes  Fistula / Graft Left Upper arm Arteriovenous vein graft  Placement Date/Time: 12/26/18 1024   Placed prior to admission: No  Orientation: Left  Access Location: Upper arm  Access Type: (c) Arteriovenous vein graft  Site Condition No complications  Fistula / Graft Assessment Present;Thrill;Bruit  Status Deaccessed  Drainage Description None

## 2019-02-23 NOTE — Progress Notes (Signed)
Pt complained of pressure and pain in the chest mid and end of shift. MD notified and Nitro tab given both times. Pt noted relief. Pt potassium elevated this AM to 6.8 with BUN of 47 and Creat 6.28.

## 2019-02-23 NOTE — Progress Notes (Signed)
HD Tx End  1.2 kg removal. Due for scheduled HD tmro. Today only for K+ elevation.     02/23/19 1345  Vital Signs  Pulse Rate (!) 103  Resp (!) 22  BP 136/81  Oxygen Therapy  SpO2 98 %  During Hemodialysis Assessment  Blood Flow Rate (mL/min) 200 mL/min  Arterial Pressure (mmHg) -120 mmHg  Venous Pressure (mmHg) 160 mmHg  Transmembrane Pressure (mmHg) 60 mmHg  Ultrafiltration Rate (mL/min) 870 mL/min  Dialysate Flow Rate (mL/min) 800 ml/min  Conductivity: Machine  14  HD Safety Checks Performed Yes  Dialysis Fluid Bolus Normal Saline  Bolus Amount (mL) 250 mL  Intra-Hemodialysis Comments Tx completed;Tolerated well

## 2019-02-23 NOTE — TOC Progression Note (Signed)
Transition of Care [facility]) - Progression Note    Patient Details  Name: Brett Small MRN: [id number] Date of Birth: 1975-11-10  Transition of [facility]) CM/SW Contact  Shela Leff, [place] Phone Number: 02/23/2019, 4:16 PM  Clinical Narrative:   CSW met with patient who has had multiple readmissions. This admission does not appear to have any concern with noncompliance. Patient informs CSW that he now lives in a boarding home and has moved out of the [place] where he was staying. Patient states he uses [place] to get to and from dialysis. Patient states he is able to get his necessities. Patient is willing to have a primary care appointment made for him. One will be made at discharge.     Expected Discharge Plan: Home/Self Care Barriers to Discharge: No Barriers Identified  Expected Discharge Plan and Services Expected Discharge Plan: Home/Self Care       Living arrangements for the past 2 months: (homeless shelter then [facility], now a boarding house) Expected Discharge Date: 03/02/19                                     Social Determinants of Health ([place]) Interventions    Readmission Risk Interventions Readmission Risk Prevention Plan 02/09/2019 02/09/2019  Transportation Screening - (No Data)  Medication Review Press photographer) - Complete  PCP or Specialist appointment within 3-5 days of discharge Patient refused -  PCP/Specialist Appt Not Complete comments patient to follow up at outpatient palliative.  Patient states he has not followed obtaining a PCP.  Patient declined for new appointment to be made -  [place] or [place] (No Data) -  Palliative Care Screening Not Applicable -  [place] Not Applicable -

## 2019-02-23 NOTE — Progress Notes (Addendum)
[place] at [place] NAME: Brett Small    MR#:  [id number]  DATE OF BIRTH:  [birth year]  SUBJECTIVE:  CHIEF COMPLAINT:   Chief Complaint  Patient presents with  . Shortness of Breath   -On and off chest pain yesterday.  Troponins and EKG normal -Feels miserable, potassium is elevated today and patient complains of significant nausea  REVIEW OF SYSTEMS:  Review of Systems  Constitutional: Negative for chills, fever and malaise/fatigue.  HENT: Negative for congestion, ear discharge, hearing loss and nosebleeds.   Eyes: Negative for blurred vision, double vision and discharge.  Respiratory: Positive for shortness of breath. Negative for cough and wheezing.   Cardiovascular: Positive for chest pain. Negative for palpitations and leg swelling.  Gastrointestinal: Positive for nausea. Negative for abdominal pain, constipation, diarrhea and vomiting.  Genitourinary: Negative for dysuria.  Musculoskeletal: Negative for myalgias.  Neurological: Negative for dizziness, focal weakness, seizures, weakness and headaches.  Psychiatric/Behavioral: Negative for depression.    DRUG ALLERGIES:   Allergies  Allergen Reactions  . Codeine Nausea Only    Patient questioned this (??)  . Sulfa Antibiotics Hives and Nausea And Vomiting    VITALS:  Blood pressure (!) 156/106, pulse (!) 107, temperature 97.9 F (36.6 C), temperature source Oral, resp. rate (!) 22, height 5\' 2"  (1.575 m), weight 42.7 kg, SpO2 99 %.  PHYSICAL EXAMINATION:  Physical Exam   GENERAL:  43 y.o.-year-old patient lying in the bed, feels uncomfortable due to intractable nausea EYES: Pupils equal, round, reactive to light and accommodation. No scleral icterus. Extraocular muscles intact.  HEENT: Head atraumatic, normocephalic. Oropharynx and nasopharynx clear.  NECK:  Supple, no jugular venous distention. No thyroid enlargement, no tenderness.  LUNGS: Normal breath sounds  bilaterally, no wheezing, rales,rhonchi or crepitation. No use of accessory muscles of respiration.  CARDIOVASCULAR: S1, S2 normal. No  rubs, or gallops.  2/6 systolic murmur is present ABDOMEN: Soft, nontender, nondistended. Bowel sounds present. No organomegaly or mass.  EXTREMITIES: No pedal edema, cyanosis, or clubbing.  NEUROLOGIC: Cranial nerves II through XII are intact. Muscle strength 5/5 in all extremities. Sensation intact. Gait not checked.  PSYCHIATRIC: The patient is alert and oriented x 3.  SKIN: No obvious rash, lesion, or ulcer.    LABORATORY PANEL:   CBC Recent Labs  Lab 02/22/19 0325  WBC 6.6  HGB 8.1*  HCT 25.3*  PLT 139*   ------------------------------------------------------------------------------------------------------------------  Chemistries  Recent Labs  Lab 02/21/19 0256  02/22/19 0325  02/23/19 0401  NA 144   < > 143  --  139  K 4.2   < > 2.9*   < > 6.4*  CL 102   < > 100  --  105  CO2 25   < > 30  --  22  GLUCOSE 170*   < > 98  --  116*  BUN 50*   < > 27*  --  47*  CREATININE 6.51*   < > 4.40*  --  6.28*  CALCIUM 7.9*   < > 8.0*  --  8.6*  MG  --   --  1.7  --   --   AST 25  --   --   --   --   ALT 22  --   --   --   --   ALKPHOS 84  --   --   --   --   BILITOT 0.8  --   --   --   --    < > =  values in this interval not displayed.   ------------------------------------------------------------------------------------------------------------------  Cardiac Enzymes Recent Labs  Lab 02/23/19 0401  TROPONINI <0.03   ------------------------------------------------------------------------------------------------------------------  RADIOLOGY:  No results found.  EKG:   Orders placed or performed during the hospital encounter of 02/21/19  . EKG 12-Lead  . EKG 12-Lead  . EKG 12-Lead  . EKG 12-Lead  . EKG 12-Lead  . EKG 12-Lead  . EKG 12-Lead  . EKG 12-Lead    ASSESSMENT AND PLAN:   43 year old male with past medical history  significant for end-stage renal disease on Tuesday Thursday Saturday hemodialysis, hypertension admitted to hospital secondary to acute hypoxic respiratory failure.  1.  Acute hypoxic respiratory failure-secondary to flash pulmonary edema. -Admitted to stepdown for BiPAP -Weaned off BiPAP, currently on room air and breathing much better -COVID-19 test is negative -Concern for underlying pneumonitis/pneumonia.  Procalcitonin is elevated -On cefepime and azithromycin  2.  End-stage renal disease on hemodialysis-was admitted 2 weeks ago with similar complaints when he missed dialysis.  However this time patient has been compliant with dialysis but not taking his blood pressure medications. -Appreciate nephrology consult.  Last dialysis was Saturday, next dialysis today due to hyperkalemia. -Patient is on Tuesday, Thursday and Saturday schedule  3.  Hyperkalemia-concern for iatrogenic hyperkalemia due to replacement of potassium yesterday. -Going for dialysis today.  Received calcium gluconate this morning  4.  Hypertension-  Patient on oral hydralazine, add norvasc -has IV labetalol as needed  5.  DVT prophylaxis-patient is on Eliquis. -He has invasive vascular procedures done for severe PAD and has been on Eliquis for the same.  Patient lives in a boarding home.  Independent at baseline   All the records are reviewed and case discussed with Care Management/Social Workerr. Management plans discussed with the patient, family and they are in agreement.  CODE STATUS: Full Code  TOTAL TIME TAKING CARE OF THIS PATIENT: 38 minutes.   POSSIBLE D/C IN 1-2 DAYS, DEPENDING ON CLINICAL CONDITION.   Gladstone Lighter M.D on 02/23/2019 at 9:57 AM  Between 7am to 6pm - Pager - [phone]  After 6pm go to [email or web address] - password [email or web address] [facility]  Office  [phone]  CC: Primary care physician; Patient, [name]

## 2019-02-23 NOTE — Progress Notes (Addendum)
Post HD Assessment 1.2 kg removal. Due for scheduled HD tmro. Today only for K+ elevation.    02/23/19 1344  Neurological  Level of Consciousness Alert  Orientation Level Oriented X4  Respiratory  Respiratory Pattern Regular  Chest Assessment Chest expansion symmetrical  Bilateral Breath Sounds Diminished  Cardiac  Pulse Regular  Heart Sounds S1, S2  Jugular Venous Distention (JVD) No  ECG Monitor Yes  Cardiac Rhythm ST  Vascular  R Radial Pulse +2  L Radial Pulse +2  Integumentary  Integumentary (WDL) X  Skin Color Appropriate for ethnicity  Skin Condition Dry  Weeping Location Ankle  Weeping Location Orientation Right  Musculoskeletal  Musculoskeletal (WDL) X  Generalized Weakness Yes  Gastrointestinal  Bowel Sounds Assessment Active  GU Assessment  Genitourinary (WDL) X  Genitourinary Symptoms Oliguria  Psychosocial  Psychosocial (WDL) WDL  Emotional support given Given to patient

## 2019-02-24 LAB — BASIC METABOLIC PANEL
Anion gap: 13 (ref 5–15)
BUN: 36 mg/dL — ABNORMAL HIGH (ref 6–20)
CO2: 29 mmol/L (ref 22–32)
Calcium: 8.6 mg/dL — ABNORMAL LOW (ref 8.9–10.3)
Chloride: 98 mmol/L (ref 98–111)
Creatinine, Ser: 4.64 mg/dL — ABNORMAL HIGH (ref 0.61–1.24)
GFR calc Af Amer: 16 mL/min — ABNORMAL LOW (ref >60)
GFR calc non Af Amer: 14 mL/min — ABNORMAL LOW (ref >60)
Glucose, Bld: 92 mg/dL (ref 70–99)
Potassium: 4.3 mmol/L (ref 3.5–5.1)
Sodium: 140 mmol/L (ref 135–145)

## 2019-02-24 LAB — HEPATITIS PANEL, ACUTE
HCV Ab: 0.3 s/co ratio (ref 0.0–0.9)
Hep A IgM: NEGATIVE
Hep B C IgM: NEGATIVE
Hepatitis B Surface Ag: NEGATIVE

## 2019-02-24 MED ORDER — AMLODIPINE BESYLATE 10 MG PO TABS: 10.0000 mg | ORAL_TABLET | Freq: Every day | ORAL | 2 refills | Status: DC

## 2019-02-24 MED ORDER — FUROSEMIDE 80 MG PO TABS: 80.0000 mg | ORAL_TABLET | ORAL | 11 refills | Status: DC

## 2019-02-24 MED ORDER — DOXYCYCLINE HYCLATE 100 MG PO CAPS: 100.0000 mg | ORAL_CAPSULE | Freq: Two times a day (BID) | ORAL | 0 refills | Status: AC

## 2019-02-24 NOTE — Progress Notes (Addendum)
Brett Small to be D/C'd home per MD order.  Discussed prescriptions and follow up appointments with the patient. Prescriptions given to patient, medication list explained in detail. Pt verbalized understanding.  Allergies as of 02/24/2019      Reactions   Codeine Nausea Only   Patient questioned this (??)   Sulfa Antibiotics Hives, Nausea And Vomiting      Medication List    STOP taking these medications   Oxaydo 5 MG Taba Generic drug:  OxyCODONE HCl (Abuse Deter)     TAKE these medications   amLODipine 10 MG tablet Commonly known as:  NORVASC Take 1 tablet (10 mg total) by mouth daily at 8 pm. What changed:    medication strength  how much to take   apixaban 2.5 MG Tabs tablet Commonly known as:  ELIQUIS Take 1 tablet (2.5 mg total) by mouth 2 (two) times daily.   aspirin EC 81 MG tablet Take 1 tablet (81 mg total) by mouth daily.   atorvastatin 80 MG tablet Commonly known as:  LIPITOR Take 1 tablet (80 mg total) by mouth at bedtime.   carvedilol 25 MG tablet Commonly known as:  COREG Take 1 tablet (25 mg total) by mouth 2 (two) times daily with a meal.   doxycycline 100 MG capsule Commonly known as:  VIBRAMYCIN Take 1 capsule (100 mg total) by mouth 2 (two) times daily for 7 days.   furosemide 80 MG tablet Commonly known as:  Lasix Take 1 tablet (80 mg total) by mouth every Monday,Wednesday,Friday, and Sunday at 6 PM. Non dialysis days Start taking on:  Feb 25, 2019   gabapentin 100 MG capsule Commonly known as:  NEURONTIN Take 1 capsule (100 mg total) by mouth 3 (three) times daily.   hydrALAZINE 25 MG tablet Commonly known as:  APRESOLINE Take 1 tablet (25 mg total) by mouth every 8 (eight) hours.   hydrOXYzine 25 MG tablet Commonly known as:  ATARAX/VISTARIL Take 25 mg by mouth 3 (three) times daily as needed for anxiety.   multivitamin Tabs tablet Take 1 tablet by mouth daily.   neomycin-bacitracin-polymyxin Oint Commonly known as:   NEOSPORIN Apply 1 application topically 2 (two) times daily.   sevelamer carbonate 800 MG tablet Commonly known as:  RENVELA Take 2 tablets (1,600 mg total) by mouth 3 (three) times daily with meals.       Vitals:   02/24/19 1330 02/24/19 1340  BP: (!) 144/74 (!) 146/82  Pulse: 99 99  Resp: 16 15  Temp:  98.1 F (36.7 C)  SpO2: 94% 95%    Skin clean, dry and intact without evidence of skin break down, no evidence of skin tears noted. IV catheter discontinued intact. Site without signs and symptoms of complications. Dressing and pressure applied. Pt denies pain at this time. No complaints noted.  An After Visit Summary was printed and given to the patient. Patient escorted via [place], and D/C home via private auto. Patient leaving with cab. PCP appt made.   Brett Small A Camri Molloy

## 2019-02-24 NOTE — Progress Notes (Signed)
HD Tx completed    02/24/19 1330  Vital Signs  Pulse Rate 99  Pulse Rate Source Monitor  Resp 16  BP (!) 144/74  BP Location Right Leg  BP Method Automatic  Patient Position (if appropriate) Lying  Oxygen Therapy  SpO2 94 %  O2 Device Room Air  During Hemodialysis Assessment  HD Safety Checks Performed Yes  KECN 58.6 KECN  Dialysis Fluid Bolus Normal Saline  Bolus Amount (mL) 250 mL  Intra-Hemodialysis Comments Tx completed;Tolerated well  Fistula / Graft Left Upper arm Arteriovenous vein graft  Placement Date/Time: 12/26/18 1024   Placed prior to admission: No  Orientation: Left  Access Location: Upper arm  Access Type: (c) Arteriovenous vein graft  Status Deaccessed  Needle Size 16g.  Drainage Description None

## 2019-02-24 NOTE — Progress Notes (Signed)
Pre Hd Tx    02/24/19 1021  Hand-Off documentation  Report given to (Full Name) Beatris Ship, RN   Report received from (Full Name) Josh , RN   Vital Signs  Temp 98.1 F (36.7 C)  Temp Source Oral  Pulse Rate 98  Pulse Rate Source Monitor  Resp 14  BP 109/80  BP Location Right Leg  BP Method Automatic  Patient Position (if appropriate) Lying  Oxygen Therapy  SpO2 97 %  O2 Device Room Air  Pain Assessment  Pain Scale 0-10  Pain Score 0  Dialysis Weight  Weight 46.1 kg  Type of Weight Pre-Dialysis  Time-Out for Hemodialysis  What Procedure? HD   Pt Identifiers(min of two) First/Last Name;MRN/Account#  Correct Site? Yes  Correct Side? Yes  Correct Procedure? Yes  Consents Verified? Yes  Rad Studies Available? N/A  Safety Precautions Reviewed? Yes  Education / Care Plan  Dialysis Education Provided Yes  Documented Education in Care Plan Yes  Fistula / Graft Left Upper arm Arteriovenous vein graft  Placement Date/Time: 12/26/18 1024   Placed prior to admission: No  Orientation: Left  Access Location: Upper arm  Access Type: (c) Arteriovenous vein graft  Site Condition No complications  Fistula / Graft Assessment Present;Thrill;Bruit  Status Deaccessed  Drainage Description None

## 2019-02-24 NOTE — Progress Notes (Signed)
Post HD Tx    02/24/19 1340  Hand-Off documentation  Report given to (Full Name) Ines Bloomer, RN   Report received from (Full Name) Beatris Ship, RN   Vital Signs  Temp 98.1 F (36.7 C)  Temp Source Oral  Pulse Rate 99  Pulse Rate Source Monitor  Resp 15  BP (!) 146/82  BP Location Right Leg  BP Method Automatic  Patient Position (if appropriate) Lying  Oxygen Therapy  SpO2 95 %  O2 Device Room Air  Pain Assessment  Pain Scale 0-10  Pain Score 0  Dialysis Weight  Weight 45 kg  Type of Weight Post-Dialysis  Post-Hemodialysis Assessment  Rinseback Volume (mL) 250 mL  KECN 58.6 V  Dialyzer Clearance Lightly streaked  Duration of HD Treatment -hour(s) 3 hour(s)  Hemodialysis Intake (mL) 500 mL  UF Total -Machine (mL) 1513 mL  Net UF (mL) 1013 mL  Tolerated HD Treatment Yes  Post-Hemodialysis Comments 5  AVG/AVF Arterial Site Held (minutes) 5 minutes  Fistula / Graft Left Upper arm Arteriovenous vein graft  Placement Date/Time: 12/26/18 1024   Placed prior to admission: No  Orientation: Left  Access Location: Upper arm  Access Type: (c) Arteriovenous vein graft  Site Condition No complications  Fistula / Graft Assessment Present;Thrill;Bruit  Drainage Description None

## 2019-02-24 NOTE — Discharge Summary (Signed)
[facility] [place] NAME: Brett Small    MR#:  [id number]  DATE OF BIRTH:  1976/06/11  DATE OF ADMISSION:  02/21/2019   ADMITTING PHYSICIAN: Harrie Foreman, MD  DATE OF DISCHARGE: 02/24/2019  PRIMARY CARE PHYSICIAN: Brett Small   ADMISSION DIAGNOSIS:   Flash pulmonary edema ([place]) [J81.0] Acute respiratory failure with hypoxia ([place]) [J96.01] Hypertensive emergency [I16.1]  DISCHARGE DIAGNOSIS:   Active Problems:   Acute respiratory failure with hypoxemia (HCC)   SECONDARY DIAGNOSIS:   Past Medical History:  Diagnosis Date   Hypertension    Renal disorder     HOSPITAL COURSE:   43 year old male with past medical history significant for end-stage renal disease on Tuesday Thursday Saturday hemodialysis, hypertension admitted to hospital secondary to acute hypoxic respiratory failure.  1.  Acute hypoxic respiratory failure-secondary to flash pulmonary edema. -Admitted to stepdown for BiPAP -Weaned off BiPAP, currently on room air and breathing much better -COVID-19 test is negative -Concern for underlying pneumonitis/pneumonia.  Procalcitonin is elevated -On cefepime and azithromycin-changed to doxycycline at discharge. -Likely cause of flash pulmonary edema secondary to uncontrolled hypertension as patient was not taking his blood pressure medications and also dietary noncompliance as he had very salty meal the day of admission - lasix started on non dialysis days  2.  End-stage renal disease on hemodialysis-Tuesday, Thursday and Saturday schedule.  Has been dialyzed on admission and for his hyperkalemia. -Being dialyzed today prior to discharge as Small his routine schedule  4.  Hypertension-  Patient on oral hydralazine, norvasc and coreg  5.  PAD -patient is on Eliquis. -He has invasive vascular procedures done for severe PAD and has been on Eliquis for the same.  Patient lives in a boarding home.   Independent at baseline Discharge today  DISCHARGE CONDITIONS:   Guarded  CONSULTS OBTAINED:   Treatment Team:  Anthonette Legato, MD  DRUG ALLERGIES:   Allergies  Allergen Reactions   Codeine Nausea Only    Patient questioned this (??)   Sulfa Antibiotics Hives and Nausea And Vomiting   DISCHARGE MEDICATIONS:   Allergies as of 02/24/2019      Reactions   Codeine Nausea Only   Patient questioned this (??)   Sulfa Antibiotics Hives, Nausea And Vomiting      Medication List    STOP taking these medications   Oxaydo 5 MG Taba Generic drug:  OxyCODONE HCl (Abuse Deter)     TAKE these medications   amLODipine 10 MG tablet Commonly known as:  NORVASC Take 1 tablet (10 mg total) by mouth daily at 8 pm. What changed:    medication strength  how much to take   apixaban 2.5 MG Tabs tablet Commonly known as:  ELIQUIS Take 1 tablet (2.5 mg total) by mouth 2 (two) times daily.   aspirin EC 81 MG tablet Take 1 tablet (81 mg total) by mouth daily.   atorvastatin 80 MG tablet Commonly known as:  LIPITOR Take 1 tablet (80 mg total) by mouth at bedtime.   carvedilol 25 MG tablet Commonly known as:  COREG Take 1 tablet (25 mg total) by mouth 2 (two) times daily with a meal.   doxycycline 100 MG capsule Commonly known as:  VIBRAMYCIN Take 1 capsule (100 mg total) by mouth 2 (two) times daily for 7 days.   furosemide 80 MG tablet Commonly known as:  Lasix Take 1 tablet (80 mg total) by mouth every Monday,Wednesday,Friday,  and Sunday at 6 PM. Non dialysis days Start taking on:  Feb 25, 2019   gabapentin 100 MG capsule Commonly known as:  NEURONTIN Take 1 capsule (100 mg total) by mouth 3 (three) times daily.   hydrALAZINE 25 MG tablet Commonly known as:  APRESOLINE Take 1 tablet (25 mg total) by mouth every 8 (eight) hours.   hydrOXYzine 25 MG tablet Commonly known as:  ATARAX/VISTARIL Take 25 mg by mouth 3 (three) times daily as needed for anxiety.     multivitamin Tabs tablet Take 1 tablet by mouth daily.   neomycin-bacitracin-polymyxin Oint Commonly known as:  NEOSPORIN Apply 1 application topically 2 (two) times daily.   sevelamer carbonate 800 MG tablet Commonly known as:  RENVELA Take 2 tablets (1,600 mg total) by mouth 3 (three) times daily with meals.        DISCHARGE INSTRUCTIONS:   1. PCP f/u in 1-2 weeks 2. Nephrology f/u for dialysis as Small schedule  DIET:   Renal diet  ACTIVITY:   Activity as tolerated  OXYGEN:   Home Oxygen: No.  Oxygen Delivery: room air  DISCHARGE LOCATION:   home   If you experience worsening of your admission symptoms, develop shortness of breath, life threatening emergency, suicidal or homicidal thoughts you must seek medical attention immediately by calling 911 or calling your MD immediately  if symptoms less severe.  You Must read complete instructions/literature along with all the possible adverse reactions/side effects for all the Medicines you take and that have been prescribed to you. Take any new Medicines after you have completely understood and accpet all the possible adverse reactions/side effects.   Please note  You were cared for by a hospitalist during your hospital stay. If you have any questions about your discharge medications or the care you received while you were in the hospital after you are discharged, you can call the unit and asked to speak with the hospitalist on call if the hospitalist that took care of you is not available. Once you are discharged, your primary care physician will handle any further medical issues. Please note that NO REFILLS for any discharge medications will be authorized once you are discharged, as it is imperative that you return to your primary care physician (or establish a relationship with a primary care physician if you do not have one) for your aftercare needs so that they can reassess your need for medications and monitor your lab  values.    On the day of Discharge:  VITAL SIGNS:   Blood pressure (!) 147/69, pulse 96, temperature 98.1 F (36.7 C), temperature source Oral, resp. rate 19, height 5\' 2"  (1.575 m), weight 46.1 kg, SpO2 93 %.  PHYSICAL EXAMINATION:    GENERAL:  43 y.o.-year-old patient lying in the bed, not in any acute distress EYES: Pupils equal, round, reactive to light and accommodation. No scleral icterus. Extraocular muscles intact.  HEENT: Head atraumatic, normocephalic. Oropharynx and nasopharynx clear.  NECK:  Supple, no jugular venous distention. No thyroid enlargement, no tenderness.  LUNGS: Normal breath sounds bilaterally, no wheezing, rales,rhonchi or crepitation. No use of accessory muscles of respiration.  CARDIOVASCULAR: S1, S2 normal. No  rubs, or gallops.  2/6 systolic murmur is present ABDOMEN: Soft, nontender, nondistended. Bowel sounds present. No organomegaly or mass.  EXTREMITIES: No pedal edema, cyanosis, or clubbing.  Left arm AV fistula present NEUROLOGIC: Cranial nerves II through XII are intact. Muscle strength 5/5 in all extremities. Sensation intact. Gait not checked.  PSYCHIATRIC:  The patient is alert and oriented x 3.  SKIN: No obvious rash, lesion, or ulcer.   DATA REVIEW:   CBC Recent Labs  Lab 02/22/19 0325  WBC 6.6  HGB 8.1*  HCT 25.3*  PLT 139*    Chemistries  Recent Labs  Lab 02/21/19 0256  02/22/19 0325  02/24/19 0531  NA 144   < > 143   < > 140  K 4.2   < > 2.9*   < > 4.3  CL 102   < > 100   < > 98  CO2 25   < > 30   < > 29  GLUCOSE 170*   < > 98   < > 92  BUN 50*   < > 27*   < > 36*  CREATININE 6.51*   < > 4.40*   < > 4.64*  CALCIUM 7.9*   < > 8.0*   < > 8.6*  MG  --   --  1.7  --   --   AST 25  --   --   --   --   ALT 22  --   --   --   --   ALKPHOS 84  --   --   --   --   BILITOT 0.8  --   --   --   --    < > = values in this interval not displayed.     Microbiology Results  Results for orders placed or performed during the  hospital encounter of 02/21/19  SARS Coronavirus 2 [facility] order, Performed in [facility] hospital lab)     Status: None   Collection Time: 02/21/19  2:49 AM  Result Value Ref Range Status   SARS Coronavirus 2 NEGATIVE NEGATIVE Final    Comment: (NOTE) If result is NEGATIVE SARS-CoV-2 target nucleic acids are NOT DETECTED. The SARS-CoV-2 RNA is generally detectable in upper and lower  respiratory specimens during the acute phase of infection. The lowest  concentration of SARS-CoV-2 viral copies this assay can detect is 250  copies / mL. A negative result does not preclude SARS-CoV-2 infection  and should not be used as the sole basis for treatment or other  patient management decisions.  A negative result may occur with  improper specimen collection / handling, submission of specimen other  than nasopharyngeal swab, presence of viral mutation(s) within the  areas targeted by this assay, and inadequate number of viral copies  (<250 copies / mL). A negative result must be combined with clinical  observations, patient history, and epidemiological information. If result is POSITIVE SARS-CoV-2 target nucleic acids are DETECTED. The SARS-CoV-2 RNA is generally detectable in upper and lower  respiratory specimens dur ing the acute phase of infection.  Positive  results are indicative of active infection with SARS-CoV-2.  Clinical  correlation with patient history and other diagnostic information is  necessary to determine patient infection status.  Positive results do  not rule out bacterial infection or co-infection with other viruses. If result is PRESUMPTIVE POSTIVE SARS-CoV-2 nucleic acids MAY BE PRESENT.   A presumptive positive result was obtained on the submitted specimen  and confirmed on repeat testing.  While 2019 novel coronavirus  (SARS-CoV-2) nucleic acids may be present in the submitted sample  additional confirmatory testing may be necessary for epidemiological  and / or  clinical management purposes  to differentiate between  SARS-CoV-2 and other Sarbecovirus currently known to infect humans.  If clinically  indicated additional testing with an alternate test  methodology [phone]) is advised. The SARS-CoV-2 RNA is generally  detectable in upper and lower respiratory sp ecimens during the acute  phase of infection. The expected result is Negative. Fact Sheet for Patients:  StrictlyIdeas.no Fact Sheet for Healthcare Providers: BankingDealers.co.za This test is not yet approved or cleared by the [place] FDA and has been authorized for detection and/or diagnosis of SARS-CoV-2 by FDA under an Emergency Use Authorization (EUA).  This EUA will remain in effect (meaning this test can be used) for the duration of the COVID-19 declaration under Section 564(b)(1) of the Act, 21 U.S.C. section 360bbb-3(b)(1), unless the authorization is terminated or revoked sooner. Performed at [facility], [address] [place], [place] [address]   CULTURE, BLOOD (ROUTINE X 2) w Reflex to ID Panel     Status: None (Preliminary result)   Collection Time: 02/21/19 10:06 AM  Result Value Ref Range Status   Specimen Description BLOOD R HAND  Final   Special Requests   Final    BOTTLES DRAWN AEROBIC AND ANAEROBIC Blood Culture adequate volume   Culture   Final    NO GROWTH 3 DAYS Performed at [facility], [address], [place], [place] [address]    Report Status PENDING  Incomplete  CULTURE, BLOOD (ROUTINE X 2) w Reflex to ID Panel     Status: None (Preliminary result)   Collection Time: 02/21/19 10:10 AM  Result Value Ref Range Status   Specimen Description BLOOD DBP  Final   Special Requests   Final    BOTTLES DRAWN AEROBIC AND ANAEROBIC Blood Culture results may not be optimal due to an inadequate volume of blood received in culture bottles   Culture   Final    NO GROWTH 3 DAYS Performed at  [facility], [address], [place], [place] [address]    Report Status PENDING  Incomplete    RADIOLOGY:  No results found.   Management plans discussed with the patient, family and they are in agreement.  CODE STATUS:     Code Status Orders  (From admission, onward)         Start     Ordered   02/21/19 0836  Full code  Continuous     02/21/19 0835        Code Status History    Date Active Date Inactive Code Status Order ID Comments User Context   02/04/2019 1343 02/09/2019 2050 Full Code [id number]  Saundra Shelling, MD ED   01/27/2019 0654 01/29/2019 2049 Partial Code [id number]  Harrie Foreman, MD Inpatient   01/07/2019 2156 01/27/2019 1638 Partial Code [id number]  Tennis Ship, MD Inpatient   12/25/2018 0158 01/07/2019 2027 Partial Code [id number]  Ina Homes, MD ED      TOTAL TIME TAKING CARE OF THIS PATIENT: 38  minutes.    Gladstone Lighter M.D on 02/24/2019 at 1:04 PM  Between 7am to 6pm - Pager - [phone]  After 6pm go to [email or web address] - Proofreader  [facility]  Office  [phone]  CC: Primary care physician; Brett Small   Note: This dictation was prepared with Dragon dictation along with smaller phrase technology. Any transcriptional errors that result from this process are unintentional.

## 2019-02-24 NOTE — Progress Notes (Signed)
HD Tx started w/o complication    92/42/68 1024  Vital Signs  Pulse Rate 99  Pulse Rate Source Monitor  Resp 12  BP 110/83  BP Location Right Leg  BP Method Automatic  Patient Position (if appropriate) Lying  Oxygen Therapy  SpO2 100 %  O2 Device Room Therapist, nutritional Number 3  Station Number  (Bedside ICU 19 )  UF/Alarm Test Passed  Conductivity: Meter 13.8  Conductivity: Machine  13.7  pH 7.2  Reverse Osmosis WRO1  Normal Saline Lot Number [id number]  Dialyzer Lot Number [id number]   Disposable Set Lot Number [id number]  Machine Temperature 98.6 F (37 C)  Musician and Audible Yes  Blood Lines Intact and Secured Yes  Pre Treatment Patient Checks  Vascular access used during treatment Graft  Hepatitis B Surface Antigen Results Negative  Date Hepatitis B Surface Antigen Drawn 01/24/19  Hepatitis B Surface Antibody  (<10)  Date Hepatitis B Surface Antibody Drawn 01/24/19  Hemodialysis Consent Verified Yes  Hemodialysis Standing Orders Initiated Yes  ECG (Telemetry) Monitor On Yes  Prime Ordered Normal Saline  Length of  DialysisTreatment -hour(s) 3 Hour(s)  Dialysis Treatment Comments Na 140  Dialyzer Elisio 17H NR  Dialysate 2K, 2.5 Ca  Dialysis Anticoagulant None  Dialysate Flow Ordered 600  Blood Flow Rate Ordered 400 mL/min  Ultrafiltration Goal 1 Liters  Dialysis Blood Pressure Support Ordered Normal Saline  During Hemodialysis Assessment  Blood Flow Rate (mL/min) 300 mL/min  Arterial Pressure (mmHg) -120 mmHg  Venous Pressure (mmHg) 170 mmHg  Transmembrane Pressure (mmHg) 70 mmHg  Ultrafiltration Rate (mL/min) 600 mL/min  Dialysate Flow Rate (mL/min) 600 ml/min  Conductivity: Machine  13.7  HD Safety Checks Performed Yes  Dialysis Fluid Bolus Normal Saline  Bolus Amount (mL) 250 mL  Intra-Hemodialysis Comments Tx initiated

## 2019-02-24 NOTE — TOC Transition Note (Signed)
Transition of Care [name]) - CM/SW Discharge Note   Patient Details  Name: Jehu Mccauslin MRN: [id number] Date of Birth: 1976/06/26  Transition of [facility]) CM/SW Contact:  Shela Leff, LCSW Phone Number: 02/24/2019, 4:38 PM   Clinical Narrative:   CSW made Estill Bamberg, the Dialysis Liason, aware of patient's discharge to home today.     Final next level of care: Home/Self Care Barriers to Discharge: No Barriers Identified   Patient Goals and CMS Choice        Discharge Placement                       Discharge Plan and Services                                     Social Determinants of Health (SDOH) Interventions     Readmission Risk Interventions Readmission Risk Prevention Plan 02/24/2019 02/09/2019 02/09/2019  Transportation Screening Complete - (No Data)  Medication Review Press photographer) Complete - Complete  PCP or Specialist appointment within 3-5 days of discharge Complete Patient refused -  PCP/Specialist Appt Not Complete comments - patient to follow up at outpatient palliative.  Patient states he has not followed obtaining a PCP.  Patient declined for new appointment to be made -  [place] or Home Care Consult Complete (No Data) -  SW Recovery Care/Counseling Consult Complete - -  Palliative Care Screening Not Applicable Not Applicable -  [place] Not Applicable Not Applicable -

## 2019-02-24 NOTE — Progress Notes (Signed)
[facility]  ROUNDING NOTE   Subjective:   Seen and examined on hemodialysis treatment. Tolerating treatment well. 2K bath  Received hemodialysis treatment yesterday due to hyperkalemia (iatrogenic). Tolerated treatment well.   Objective:  Vital signs in last 24 hours:  Temp:  [98.1 F (36.7 C)-98.8 F (37.1 C)] 98.1 F (36.7 C) (05/05 0544) Pulse Rate:  [89-107] 89 (05/05 0544) Resp:  [18-26] 18 (05/05 0544) BP: (107-146)/(71-87) 117/75 (05/05 0544) SpO2:  [92 %-98 %] 95 % (05/05 0544) Weight:  [41.5 kg-46.1 kg] 46.1 kg (05/05 0500)  Weight change: 0 kg Filed Weights   02/23/19 0940 02/23/19 1347 02/24/19 0500  Weight: 42.7 kg 41.5 kg 46.1 kg    Intake/Output: I/O last 3 completed shifts: In: 240 [P.O.:240] Out: 1200 [Other:1200]   Intake/Output this shift:  Total I/O In: 240.4 [P.O.:240; I.V.:0.4] Out: -   Physical Exam: General:  No acute distress  Head: Normocephalic, atraumatic.  Eyes: Anicteric  Neck: Supple  Lungs:  clear  Heart: Regular, no rubs  Abdomen:  Soft, nontender, bowel sounds present  Extremities: no peripheral edema.  Neurologic: Awake, alert, follows commands  Skin: No lesions  Access: left arm AVG      Basic Metabolic Panel: Recent Labs  Lab 02/21/19 0256 02/21/19 1010 02/22/19 0325 02/22/19 1241 02/23/19 0401 02/23/19 1018 02/24/19 0531  NA 144 144 143  --  139  --  140  K 4.2 3.5 2.9* 4.1 6.4*  --  4.3  CL 102 107 100  --  105  --  98  CO2 25 23 30   --  22  --  29  GLUCOSE 170* 91 98  --  116*  --  92  BUN 50* 57* 27*  --  47*  --  36*  CREATININE 6.51* 6.86* 4.40*  --  6.28*  --  4.64*  CALCIUM 7.9* 7.6* 8.0*  --  8.6*  --  8.6*  MG  --   --  1.7  --   --   --   --   PHOS  --  5.3* 4.1  --   --  3.2  --     Liver Function Tests: Recent Labs  Lab 02/21/19 0256 02/21/19 1010  AST 25  --   ALT 22  --   ALKPHOS 84  --   BILITOT 0.8  --   PROT 7.9  --   ALBUMIN 3.8 2.9*   No results for input(s):  LIPASE, AMYLASE in the last 168 hours. No results for input(s): AMMONIA in the last 168 hours.  CBC: Recent Labs  Lab 02/21/19 0256 02/21/19 1010 02/22/19 0325  WBC 14.6* 7.9 6.6  NEUTROABS 11.5*  --   --   HGB 10.0* 7.5* 8.1*  HCT 31.8* 23.7* 25.3*  MCV 99.7 96.7 96.6  PLT 296 183 139*    Cardiac Enzymes: Recent Labs  Lab 02/21/19 0256 02/22/19 1605 02/22/19 2151 02/23/19 0401  TROPONINI <0.03 <0.03 <0.03 <0.03    BNP: Invalid input(s): POCBNP  CBG: Recent Labs  Lab 02/21/19 3244  WNUUVO 53    Microbiology: Results for orders placed or performed during the hospital encounter of 02/21/19  SARS Coronavirus 2 [facility] order, Performed in [place] hospital lab)     Status: None   Collection Time: 02/21/19  2:49 AM  Result Value Ref Range Status   SARS Coronavirus 2 NEGATIVE NEGATIVE Final    Comment: (NOTE) If result is NEGATIVE SARS-CoV-2 target nucleic acids are NOT  DETECTED. The SARS-CoV-2 RNA is generally detectable in upper and lower  respiratory specimens during the acute phase of infection. The lowest  concentration of SARS-CoV-2 viral copies this assay can detect is 250  copies / mL. A negative result does not preclude SARS-CoV-2 infection  and should not be used as the sole basis for treatment or other  patient management decisions.  A negative result may occur with  improper specimen collection / handling, submission of specimen other  than nasopharyngeal swab, presence of viral mutation(s) within the  areas targeted by this assay, and inadequate number of viral copies  (<250 copies / mL). A negative result must be combined with clinical  observations, patient history, and epidemiological information. If result is POSITIVE SARS-CoV-2 target nucleic acids are DETECTED. The SARS-CoV-2 RNA is generally detectable in upper and lower  respiratory specimens dur ing the acute phase of infection.  Positive  results are indicative of active infection  with SARS-CoV-2.  Clinical  correlation with patient history and other diagnostic information is  necessary to determine patient infection status.  Positive results do  not rule out bacterial infection or co-infection with other viruses. If result is PRESUMPTIVE POSTIVE SARS-CoV-2 nucleic acids MAY BE PRESENT.   A presumptive positive result was obtained on the submitted specimen  and confirmed on repeat testing.  While 2019 novel coronavirus  (SARS-CoV-2) nucleic acids may be present in the submitted sample  additional confirmatory testing may be necessary for epidemiological  and / or clinical management purposes  to differentiate between  SARS-CoV-2 and other Sarbecovirus currently known to infect humans.  If clinically indicated additional testing with an alternate test  methodology [phone]) is advised. The SARS-CoV-2 RNA is generally  detectable in upper and lower respiratory sp ecimens during the acute  phase of infection. The expected result is Negative. Fact Sheet for Patients:  StrictlyIdeas.no Fact Sheet for Healthcare Providers: BankingDealers.co.za This test is not yet approved or cleared by the [place] FDA and has been authorized for detection and/or diagnosis of SARS-CoV-2 by FDA under an Emergency Use Authorization (EUA).  This EUA will remain in effect (meaning this test can be used) for the duration of the COVID-19 declaration under Section 564(b)(1) of the Act, 21 U.S.C. section 360bbb-3(b)(1), unless the authorization is terminated or revoked sooner. Performed at [facility], [address] [place], [place] [address]   CULTURE, BLOOD (ROUTINE X 2) w Reflex to ID Panel     Status: None (Preliminary result)   Collection Time: 02/21/19 10:06 AM  Result Value Ref Range Status   Specimen Description BLOOD R HAND  Final   Special Requests   Final    BOTTLES DRAWN AEROBIC AND ANAEROBIC Blood Culture  adequate volume   Culture   Final    NO GROWTH 3 DAYS Performed at [facility], [address] [place], [place] [address]    Report Status PENDING  Incomplete  CULTURE, BLOOD (ROUTINE X 2) w Reflex to ID Panel     Status: None (Preliminary result)   Collection Time: 02/21/19 10:10 AM  Result Value Ref Range Status   Specimen Description BLOOD DBP  Final   Special Requests   Final    BOTTLES DRAWN AEROBIC AND ANAEROBIC Blood Culture results may not be optimal due to an inadequate volume of blood received in culture bottles   Culture   Final    NO GROWTH 3 DAYS Performed at [facility], [address] [place], [place]  [address]    Report Status PENDING  Incomplete    Coagulation Studies: No results for input(s): LABPROT, INR in the last 72 hours.  Urinalysis: No results for input(s): COLORURINE, LABSPEC, PHURINE, GLUCOSEU, HGBUR, BILIRUBINUR, KETONESUR, PROTEINUR, UROBILINOGEN, NITRITE, LEUKOCYTESUR in the last 72 hours.  Invalid input(s): APPERANCEUR    Imaging: No results found.   Medications:   . sodium chloride Stopped (02/23/19 0835)  . ceFEPime (MAXIPIME) IV     . amLODipine  10 mg Oral Q2000  . apixaban  2.5 mg Oral BID  . aspirin EC  81 mg Oral Daily  . atorvastatin  80 mg Oral QHS  . azithromycin  500 mg Oral Daily  . carvedilol  25 mg Oral BID WC  . Chlorhexidine Gluconate Cloth  6 each Topical Q0600  . docusate sodium  100 mg Oral BID  . epoetin (EPOGEN/PROCRIT) injection  10,000 Units Intravenous Q T,Th,Sa-HD  . feeding supplement (NEPRO CARB STEADY)  237 mL Oral BID BM  . gabapentin  100 mg Oral BID  . hydrALAZINE  25 mg Oral Q8H  . loratadine  10 mg Oral Daily  . multivitamin  1 tablet Oral Daily  . sevelamer carbonate  1,600 mg Oral TID WC  . sodium zirconium cyclosilicate  5 g Oral Daily   sodium chloride, acetaminophen **OR** acetaminophen, hydrOXYzine, nitroGLYCERIN, ondansetron **OR** ondansetron (ZOFRAN) IV  Assessment/  Plan:  [name] is a 43 y.o. white male with end stage renal disease on hemodialysis, hypertension, depression, aortobifemoral bypass  [place]. TTS left AVG 46.5kg 2K bath  1.  End stage renal disease on hemodialysis with hyperkalemia: hyperkalemia was iatrogenic. Discontinued PO potassium supplements - Low K diet - Seen and examined on hemodialysis treatment. Resume TTS schedule - [place]  2.  Anemia of chronic kidney disease: hemoglobin 8.1 - EPO with TTS HD treatments.  - IV iron as outpatient.   4.  Secondary hyperparathyroidism. Phosphorus and calcium at goal. Labs from 4/23 PTH 342 - at goal.  - Continue sevelamer 1600 mg p.o. 3 times daily.  5.  Hypertension: well controlled today. Current regimen of amlodipine, hydralazine and carvedilol. Start furosemide on discharge.    LOS: 3 Brett Small 5/5/202011:27 AM

## 2019-02-26 LAB — CULTURE, BLOOD (ROUTINE X 2)
Culture: NO GROWTH
Culture: NO GROWTH
Special Requests: ADEQUATE

## 2019-02-28 ENCOUNTER — Emergency Department: Payer: Medicare Other

## 2019-02-28 ENCOUNTER — Other Ambulatory Visit: Payer: Self-pay

## 2019-02-28 ENCOUNTER — Inpatient Hospital Stay
Admission: EM | Admit: 2019-02-28 | Discharge: 2019-03-03 | DRG: 853 | Disposition: A | Discharge status: Home / Self Care | Payer: Medicare Other | Attending: Family Medicine | Admitting: Family Medicine

## 2019-02-28 DIAGNOSIS — Z992 Dependence on renal dialysis: Secondary | ICD-10-CM | POA: Diagnosis not present

## 2019-02-28 DIAGNOSIS — D631 Anemia in chronic kidney disease: Secondary | ICD-10-CM | POA: Diagnosis present

## 2019-02-28 DIAGNOSIS — F329 Major depressive disorder, single episode, unspecified: Secondary | ICD-10-CM | POA: Diagnosis present

## 2019-02-28 DIAGNOSIS — Z79899 Other long term (current) drug therapy: Secondary | ICD-10-CM

## 2019-02-28 DIAGNOSIS — I743 Embolism and thrombosis of arteries of the lower extremities: Secondary | ICD-10-CM | POA: Diagnosis present

## 2019-02-28 DIAGNOSIS — T82318A Breakdown (mechanical) of other vascular grafts, initial encounter: Secondary | ICD-10-CM | POA: Diagnosis present

## 2019-02-28 DIAGNOSIS — Z20828 Contact with and (suspected) exposure to other viral communicable diseases: Secondary | ICD-10-CM | POA: Diagnosis present

## 2019-02-28 DIAGNOSIS — Y712 Prosthetic and other implants, materials and accessory cardiovascular devices associated with adverse incidents: Secondary | ICD-10-CM | POA: Diagnosis present

## 2019-02-28 DIAGNOSIS — J9601 Acute respiratory failure with hypoxia: Secondary | ICD-10-CM | POA: Diagnosis present

## 2019-02-28 DIAGNOSIS — Z8679 Personal history of other diseases of the circulatory system: Secondary | ICD-10-CM

## 2019-02-28 DIAGNOSIS — J9600 Acute respiratory failure, unspecified whether with hypoxia or hypercapnia: Secondary | ICD-10-CM | POA: Diagnosis present

## 2019-02-28 DIAGNOSIS — A419 Sepsis, unspecified organism: Secondary | ICD-10-CM | POA: Diagnosis present

## 2019-02-28 DIAGNOSIS — T82818A Embolism of vascular prosthetic devices, implants and grafts, initial encounter: Secondary | ICD-10-CM | POA: Diagnosis present

## 2019-02-28 DIAGNOSIS — I132 Hypertensive heart and chronic kidney disease with heart failure and with stage 5 chronic kidney disease, or end stage renal disease: Secondary | ICD-10-CM | POA: Diagnosis present

## 2019-02-28 DIAGNOSIS — F17200 Nicotine dependence, unspecified, uncomplicated: Secondary | ICD-10-CM | POA: Diagnosis present

## 2019-02-28 DIAGNOSIS — J181 Lobar pneumonia, unspecified organism: Secondary | ICD-10-CM | POA: Diagnosis present

## 2019-02-28 DIAGNOSIS — N179 Acute kidney failure, unspecified: Secondary | ICD-10-CM | POA: Diagnosis present

## 2019-02-28 DIAGNOSIS — Z882 Allergy status to sulfonamides status: Secondary | ICD-10-CM

## 2019-02-28 DIAGNOSIS — E876 Hypokalemia: Secondary | ICD-10-CM | POA: Diagnosis not present

## 2019-02-28 DIAGNOSIS — N186 End stage renal disease: Secondary | ICD-10-CM | POA: Diagnosis present

## 2019-02-28 DIAGNOSIS — Y832 Surgical operation with anastomosis, bypass or graft as the cause of abnormal reaction of the patient, or of later complication, without mention of misadventure at the time of the procedure: Secondary | ICD-10-CM | POA: Diagnosis present

## 2019-02-28 DIAGNOSIS — T82868A Thrombosis of vascular prosthetic devices, implants and grafts, initial encounter: Secondary | ICD-10-CM | POA: Diagnosis not present

## 2019-02-28 DIAGNOSIS — Y95 Nosocomial condition: Secondary | ICD-10-CM | POA: Diagnosis present

## 2019-02-28 DIAGNOSIS — Z832 Family history of diseases of the blood and blood-forming organs and certain disorders involving the immune mechanism: Secondary | ICD-10-CM | POA: Diagnosis not present

## 2019-02-28 DIAGNOSIS — J9690 Respiratory failure, unspecified, unspecified whether with hypoxia or hypercapnia: Secondary | ICD-10-CM

## 2019-02-28 DIAGNOSIS — R000 Tachycardia, unspecified: Secondary | ICD-10-CM | POA: Diagnosis present

## 2019-02-28 DIAGNOSIS — Y848 Other medical procedures as the cause of abnormal reaction of the patient, or of later complication, without mention of misadventure at the time of the procedure: Secondary | ICD-10-CM | POA: Diagnosis present

## 2019-02-28 DIAGNOSIS — Z833 Family history of diabetes mellitus: Secondary | ICD-10-CM

## 2019-02-28 DIAGNOSIS — I5033 Acute on chronic diastolic (congestive) heart failure: Secondary | ICD-10-CM | POA: Diagnosis present

## 2019-02-28 DIAGNOSIS — Z7982 Long term (current) use of aspirin: Secondary | ICD-10-CM | POA: Diagnosis not present

## 2019-02-28 DIAGNOSIS — Z9889 Other specified postprocedural states: Secondary | ICD-10-CM | POA: Diagnosis not present

## 2019-02-28 DIAGNOSIS — Z885 Allergy status to narcotic agent status: Secondary | ICD-10-CM | POA: Diagnosis not present

## 2019-02-28 DIAGNOSIS — T82510A Breakdown (mechanical) of surgically created arteriovenous fistula, initial encounter: Secondary | ICD-10-CM | POA: Diagnosis present

## 2019-02-28 DIAGNOSIS — Z7901 Long term (current) use of anticoagulants: Secondary | ICD-10-CM

## 2019-02-28 DIAGNOSIS — Z8249 Family history of ischemic heart disease and other diseases of the circulatory system: Secondary | ICD-10-CM

## 2019-02-28 DIAGNOSIS — I509 Heart failure, unspecified: Secondary | ICD-10-CM

## 2019-02-28 DIAGNOSIS — J189 Pneumonia, unspecified organism: Secondary | ICD-10-CM

## 2019-02-28 DIAGNOSIS — N2581 Secondary hyperparathyroidism of renal origin: Secondary | ICD-10-CM | POA: Diagnosis present

## 2019-02-28 LAB — COMPREHENSIVE METABOLIC PANEL
ALT: 47 U/L — ABNORMAL HIGH (ref 0–44)
AST: 39 U/L (ref 15–41)
Albumin: 3.6 g/dL (ref 3.5–5.0)
Alkaline Phosphatase: 96 U/L (ref 38–126)
Anion gap: 19 — ABNORMAL HIGH (ref 5–15)
BUN: 54 mg/dL — ABNORMAL HIGH (ref 6–20)
CO2: 19 mmol/L — ABNORMAL LOW (ref 22–32)
Calcium: 7.5 mg/dL — ABNORMAL LOW (ref 8.9–10.3)
Chloride: 102 mmol/L (ref 98–111)
Creatinine, Ser: 7.6 mg/dL — ABNORMAL HIGH (ref 0.61–1.24)
GFR calc Af Amer: 9 mL/min — ABNORMAL LOW (ref >60)
GFR calc non Af Amer: 8 mL/min — ABNORMAL LOW (ref >60)
Glucose, Bld: 182 mg/dL — ABNORMAL HIGH (ref 70–99)
Potassium: 3.5 mmol/L (ref 3.5–5.1)
Sodium: 140 mmol/L (ref 135–145)
Total Bilirubin: 0.7 mg/dL (ref 0.3–1.2)
Total Protein: 7.6 g/dL (ref 6.5–8.1)

## 2019-02-28 LAB — CBC WITH DIFFERENTIAL/PLATELET
Abs Immature Granulocytes: 0.18 10*3/uL — ABNORMAL HIGH (ref 0.00–0.07)
Basophils Absolute: 0.1 10*3/uL (ref 0.0–0.1)
Basophils Relative: 1 %
Eosinophils Absolute: 0.1 10*3/uL (ref 0.0–0.5)
Eosinophils Relative: 1 %
HCT: 30.1 % — ABNORMAL LOW (ref 39.0–52.0)
Hemoglobin: 9.2 g/dL — ABNORMAL LOW (ref 13.0–17.0)
Immature Granulocytes: 1 %
Lymphocytes Relative: 9 %
Lymphs Abs: 1.5 10*3/uL (ref 0.7–4.0)
MCH: 31.3 pg (ref 26.0–34.0)
MCHC: 30.6 g/dL (ref 30.0–36.0)
MCV: 102.4 fL — ABNORMAL HIGH (ref 80.0–100.0)
Monocytes Absolute: 0.7 10*3/uL (ref 0.1–1.0)
Monocytes Relative: 4 %
Neutro Abs: 14.6 10*3/uL — ABNORMAL HIGH (ref 1.7–7.7)
Neutrophils Relative %: 84 %
Platelets: 267 10*3/uL (ref 150–400)
RBC: 2.94 MIL/uL — ABNORMAL LOW (ref 4.22–5.81)
RDW: 15.3 % (ref 11.5–15.5)
WBC: 17.2 10*3/uL — ABNORMAL HIGH (ref 4.0–10.5)
nRBC: 0.5 % — ABNORMAL HIGH (ref 0.0–0.2)

## 2019-02-28 LAB — LACTIC ACID, PLASMA
Lactic Acid, Venous: 6.5 mmol/L (ref 0.5–1.9)
Lactic Acid, Venous: 2.3 mmol/L (ref 0.5–1.9)

## 2019-02-28 LAB — BRAIN NATRIURETIC PEPTIDE: B Natriuretic Peptide: 3471 pg/mL — ABNORMAL HIGH (ref 0.0–100.0)

## 2019-02-28 LAB — SARS CORONAVIRUS 2 BY RT PCR (HOSPITAL ORDER, PERFORMED IN ~~LOC~~ HOSPITAL LAB): SARS Coronavirus 2: NEGATIVE

## 2019-02-28 LAB — GLUCOSE, CAPILLARY: Glucose-Capillary: 118 mg/dL — ABNORMAL HIGH (ref 70–99)

## 2019-02-28 LAB — TROPONIN I: Troponin I: 0.07 ng/mL (ref <0.03)

## 2019-02-28 MED ORDER — DOCUSATE SODIUM 100 MG PO CAPS: 100.0000 mg | ORAL_CAPSULE | Freq: Two times a day (BID) | ORAL | Status: DC | PRN

## 2019-02-28 MED ORDER — HYDRALAZINE HCL 25 MG PO TABS
25.0000 mg | ORAL_TABLET | Freq: Three times a day (TID) | ORAL | Status: DC
  Administered 2019-03-01 – 2019-03-03 (×8): 25 mg via ORAL
  Filled 2019-02-28 (×8): qty 1

## 2019-02-28 MED ORDER — SODIUM CHLORIDE 0.9 % IV SOLN
1.0000 g | INTRAVENOUS | Status: DC
  Filled 2019-02-28: qty 1

## 2019-02-28 MED ORDER — NITROGLYCERIN IN D5W 200-5 MCG/ML-% IV SOLN
0.0000 ug/min | INTRAVENOUS | Status: DC
  Administered 2019-02-28: 50 ug/min via INTRAVENOUS
  Filled 2019-02-28: qty 250

## 2019-02-28 MED ORDER — ASPIRIN EC 81 MG PO TBEC
81.0000 mg | DELAYED_RELEASE_TABLET | Freq: Every day | ORAL | Status: DC
  Administered 2019-03-01 – 2019-03-03 (×3): 81 mg via ORAL
  Filled 2019-02-28 (×3): qty 1

## 2019-02-28 MED ORDER — RENA-VITE PO TABS
1.0000 | ORAL_TABLET | Freq: Every day | ORAL | Status: DC
  Administered 2019-03-01 – 2019-03-03 (×3): 1 via ORAL
  Filled 2019-02-28 (×3): qty 1

## 2019-02-28 MED ORDER — VANCOMYCIN HCL IN DEXTROSE 1-5 GM/200ML-% IV SOLN
1000.0000 mg | Freq: Once | INTRAVENOUS | Status: AC
  Administered 2019-02-28: 1000 mg via INTRAVENOUS
  Filled 2019-02-28: qty 200

## 2019-02-28 MED ORDER — SEVELAMER CARBONATE 800 MG PO TABS
1600.0000 mg | ORAL_TABLET | Freq: Three times a day (TID) | ORAL | Status: DC
  Administered 2019-03-01 – 2019-03-03 (×5): 1600 mg via ORAL
  Filled 2019-02-28 (×6): qty 2

## 2019-02-28 MED ORDER — GABAPENTIN 100 MG PO CAPS
100.0000 mg | ORAL_CAPSULE | Freq: Three times a day (TID) | ORAL | Status: DC
  Administered 2019-03-01 – 2019-03-03 (×8): 100 mg via ORAL
  Filled 2019-02-28 (×8): qty 1

## 2019-02-28 MED ORDER — APIXABAN 2.5 MG PO TABS
2.5000 mg | ORAL_TABLET | Freq: Two times a day (BID) | ORAL | Status: DC
  Administered 2019-03-01 – 2019-03-03 (×5): 2.5 mg via ORAL
  Filled 2019-02-28 (×5): qty 1

## 2019-02-28 MED ORDER — CHLORHEXIDINE GLUCONATE 0.12 % MT SOLN
15.0000 mL | Freq: Two times a day (BID) | OROMUCOSAL | Status: DC
  Administered 2019-03-01 – 2019-03-03 (×4): 15 mL via OROMUCOSAL
  Filled 2019-02-28 (×5): qty 15

## 2019-02-28 MED ORDER — ATORVASTATIN CALCIUM 20 MG PO TABS
80.0000 mg | ORAL_TABLET | Freq: Every day | ORAL | Status: DC
  Administered 2019-03-01 – 2019-03-02 (×2): 80 mg via ORAL
  Filled 2019-02-28 (×2): qty 4

## 2019-02-28 MED ORDER — IPRATROPIUM-ALBUTEROL 0.5-2.5 (3) MG/3ML IN SOLN
3.0000 mL | RESPIRATORY_TRACT | Status: DC
  Administered 2019-02-28 – 2019-03-01 (×2): 3 mL via RESPIRATORY_TRACT
  Filled 2019-02-28 (×2): qty 3

## 2019-02-28 MED ORDER — FUROSEMIDE 40 MG PO TABS
80.0000 mg | ORAL_TABLET | ORAL | Status: DC
  Administered 2019-03-01 – 2019-03-02 (×2): 80 mg via ORAL
  Filled 2019-02-28 (×3): qty 2

## 2019-02-28 MED ORDER — ORAL CARE MOUTH RINSE
15.0000 mL | Freq: Two times a day (BID) | OROMUCOSAL | Status: DC
  Administered 2019-03-01 – 2019-03-03 (×4): 15 mL via OROMUCOSAL

## 2019-02-28 MED ORDER — CHLORHEXIDINE GLUCONATE CLOTH 2 % EX PADS
6.0000 | MEDICATED_PAD | Freq: Every day | CUTANEOUS | Status: DC
  Administered 2019-03-03: 6 via TOPICAL

## 2019-02-28 MED ORDER — AMLODIPINE BESYLATE 10 MG PO TABS
10.0000 mg | ORAL_TABLET | Freq: Every day | ORAL | Status: DC
  Administered 2019-03-01 – 2019-03-02 (×2): 10 mg via ORAL
  Filled 2019-02-28 (×2): qty 1

## 2019-02-28 MED ORDER — SODIUM CHLORIDE 0.9 % IV SOLN
2.0000 g | Freq: Once | INTRAVENOUS | Status: AC
  Administered 2019-02-28: 2 g via INTRAVENOUS
  Filled 2019-02-28: qty 2

## 2019-02-28 MED ORDER — VANCOMYCIN HCL IN DEXTROSE 500-5 MG/100ML-% IV SOLN: 500.0000 mg | INTRAVENOUS | Status: DC

## 2019-02-28 MED ORDER — FENTANYL CITRATE (PF) 100 MCG/2ML IJ SOLN
25.0000 ug | Freq: Once | INTRAMUSCULAR | Status: AC
  Administered 2019-02-28: 12.5 ug via INTRAVENOUS
  Filled 2019-02-28: qty 2

## 2019-02-28 NOTE — ED Triage Notes (Signed)
Pt arrives emergency traffic for resp distress. Dialysis pt. Fistula L arm. States SOB began today. Placed on bipap upon arrival. Tachy in 140's.

## 2019-02-28 NOTE — Procedures (Signed)
  OPERATIVE NOTE   PROCEDURE: 1. Ultrasound guidance for vascular access RIGHT FEMORAL vein 2. Placement of a 30cm temporary dialysis catheter RIGHT FEMORAL vein  PRE-OPERATIVE DIAGNOSIS: 1. Acute renal failure 2. Malfunctioning LEFT Brachial Axillary graft  POST-OPERATIVE DIAGNOSIS: Same  SURGEON: Jamesetta So, MD  ASSISTANT(S): None  ANESTHESIA: local  ESTIMATED BLOOD LOSS: Minimal   FINDING(S): 1. None  SPECIMEN(S): None  INDICATIONS:  Patient is a 43 y.o.male who presents with malfunctioning left AVF.  Risks and benefits were discussed, and informed consent was obtained..  DESCRIPTION: After obtaining full informed written consent, the patient was laid flat in the bed. The RIGHT groin was sterilely prepped and draped in a sterile surgical field was created. The right femoral vein was visualized with ultrasound and found to be widely patent. It was then accessed under direct guidance without difficulty with a Seldinger needle and a permanent image was recorded. A J-wire was then placed. After skin nick and dilatation, a 30cm dialysis catheter was placed over the wire and the wire was removed. The lumens withdrew dark red nonpulsatile blood and flushed easily with sterile saline. The catheter was secured to the skin with 3 nylon sutures. Sterile dressing was placed.  COMPLICATIONS: None  CONDITION: Stable  [name] A 02/28/2019 8:34 PM  This note was created with Dragon Medical transcription system. Any errors in dictation are purely unintentional.

## 2019-02-28 NOTE — H&P (Signed)
[place] [facility] [place] NAME: Brett Small    MR#:  [id number]  DATE OF BIRTH:  1976/03/27  DATE OF ADMISSION:  02/28/2019  PRIMARY CARE PHYSICIAN: Patient, [name]   REQUESTING/REFERRING PHYSICIAN: Siadecki  CHIEF COMPLAINT:   Chief Complaint  Patient presents with  . Respiratory Distress    HISTORY OF PRESENT ILLNESS: Brett Small  is a 43 y.o. male with a known history of hypertension, end-stage renal disease on hemodialysis, aortofemoral bypass with blood clot in the in the past and requiring lifelong anticoagulation since then-had some shortness of breath since today morning.  Went for his hemodialysis they could not access his fistula.  He came to emergency room.  He is noted to be tachycardic and hypoxic with elevated white blood cell count and lactic acid level.  His chest x-ray is showing lobar pneumonia.  His COVID-19 test is negative.  He was initially hypoxic in spite of requiring supplemental oxygen so started on BiPAP by ER physician given to hospitalist team after starting broad-spectrum antibiotics.  PAST MEDICAL HISTORY:   Past Medical History:  Diagnosis Date  . Hypertension   . Renal disorder     PAST SURGICAL HISTORY:  Past Surgical History:  Procedure Laterality Date  . A/V SHUNT INTERVENTION Left 01/19/2019   Procedure: LEFT UPPER EXTREMITY A/V SHUNTOGRAM / UPPER EXTREMITY ANGIOGRAM;  Surgeon: Algernon Huxley, MD;  Location: [facility];  Service: Cardiovascular;  Laterality: Left;  . AORTA - FEMORAL ARTERY BYPASS GRAFT    . AV FISTULA PLACEMENT Left 12/26/2018   Procedure: INSERTION OF GORE STRETCH VASCULAR 4-7MM X  45CM IN LEFT UPPER ARM;  Surgeon: Marty Heck, MD;  Location: [facility];  Service: Vascular;  Laterality: Left;  . DIALYSIS/PERMA CATHETER REMOVAL N/A 02/06/2019   Procedure: DIALYSIS/PERMA CATHETER REMOVAL;  Surgeon: Algernon Huxley, MD;  Location: [facility];  Service: Cardiovascular;   Laterality: N/A;    SOCIAL HISTORY:  Social History   Tobacco Use  . Smoking status: Current Every Day Smoker    Last attempt to quit: 06/26/2018    Years since quitting: 0.6  . Smokeless tobacco: Never Used  . Tobacco comment: smoked for 30 years   Substance Use Topics  . Alcohol use: Not Currently    FAMILY HISTORY:  Family History  Problem Relation Age of Onset  . Hypertension Other   . Diabetes Other   . Clotting disorder Father     DRUG ALLERGIES:  Allergies  Allergen Reactions  . Codeine Nausea Only    Patient questioned this (??)  . Sulfa Antibiotics Hives and Nausea And Vomiting    REVIEW OF SYSTEMS:   CONSTITUTIONAL: No fever, fatigue or weakness.  EYES: No blurred or double vision.  EARS, NOSE, AND THROAT: No tinnitus or ear pain.  RESPIRATORY: Had cough, shortness of breath, no wheezing or hemoptysis.  CARDIOVASCULAR: No chest pain, orthopnea, edema.  GASTROINTESTINAL: No nausea, vomiting, diarrhea or abdominal pain.  GENITOURINARY: No dysuria, hematuria.  ENDOCRINE: No polyuria, nocturia,  HEMATOLOGY: No anemia, easy bruising or bleeding SKIN: No rash or lesion. MUSCULOSKELETAL: No joint pain or arthritis.   NEUROLOGIC: No tingling, numbness, weakness.  PSYCHIATRY: No anxiety or depression.   MEDICATIONS AT HOME:  Prior to Admission medications   Medication Sig Start Date End Date Taking? Authorizing Provider  amLODipine (NORVASC) 10 MG tablet Take 1 tablet (10 mg total) by mouth daily at 8 pm. 02/24/19  Yes Gladstone Lighter, MD  apixaban (ELIQUIS) 2.5 MG TABS tablet Take 1 tablet (2.5 mg total) by mouth 2 (two) times daily. 01/26/19  Yes Clapacs, Madie Reno, MD  aspirin EC 81 MG tablet Take 1 tablet (81 mg total) by mouth daily. 01/26/19  Yes Clapacs, Madie Reno, MD  atorvastatin (LIPITOR) 80 MG tablet Take 1 tablet (80 mg total) by mouth at bedtime. 01/26/19  Yes Clapacs, Madie Reno, MD  carvedilol (COREG) 25 MG tablet Take 1 tablet (25 mg total) by mouth 2 (two)  times daily with a meal. 01/26/19  Yes Clapacs, Madie Reno, MD  doxycycline (VIBRAMYCIN) 100 MG capsule Take 1 capsule (100 mg total) by mouth 2 (two) times daily for 7 days. 02/24/19 03/03/19 Yes Gladstone Lighter, MD  furosemide (LASIX) 80 MG tablet Take 1 tablet (80 mg total) by mouth every Monday,Wednesday,Friday, and Sunday at 6 PM. Non dialysis days 02/25/19 02/25/20 Yes Gladstone Lighter, MD  gabapentin (NEURONTIN) 100 MG capsule Take 1 capsule (100 mg total) by mouth 3 (three) times daily. 01/26/19  Yes Clapacs, Madie Reno, MD  hydrALAZINE (APRESOLINE) 25 MG tablet Take 1 tablet (25 mg total) by mouth every 8 (eight) hours. 01/26/19  Yes Clapacs, Madie Reno, MD  hydrOXYzine (ATARAX/VISTARIL) 25 MG tablet Take 25 mg by mouth 3 (three) times daily as needed for anxiety.   Yes [provider]  multivitamin (RENA-VIT) TABS tablet Take 1 tablet by mouth daily. 01/27/19  Yes Clapacs, Madie Reno, MD  neomycin-bacitracin-polymyxin (NEOSPORIN) OINT Apply 1 application topically 2 (two) times daily. 01/26/19  Yes Clapacs, Madie Reno, MD  sevelamer carbonate (RENVELA) 800 MG tablet Take 2 tablets (1,600 mg total) by mouth 3 (three) times daily with meals. 01/26/19  Yes Clapacs, Madie Reno, MD      PHYSICAL EXAMINATION:   VITAL SIGNS: Blood pressure (!) 149/96, pulse (!) 105, temperature 98.9 F (37.2 C), temperature source Axillary, resp. rate 16, height 5\' 2"  (1.575 m), weight 49.9 kg, SpO2 100 %.  GENERAL:  43 y.o.-year-old patient lying in the bed with no acute distress.  EYES: Pupils equal, round, reactive to light and accommodation. No scleral icterus. Extraocular muscles intact.  HEENT: Head atraumatic, normocephalic. Oropharynx and nasopharynx clear.  NECK:  Supple, no jugular venous distention. No thyroid enlargement, no tenderness.  LUNGS: Normal breath sounds bilaterally, some wheezing, some crepitation. No use of accessory muscles of respiration.  BiPAP in use. CARDIOVASCULAR: S1, S2 normal. No murmurs, rubs, or  gallops.  ABDOMEN: Soft, nontender, nondistended. Bowel sounds present. No organomegaly or mass.  EXTREMITIES: No pedal edema, cyanosis, or clubbing.  NEUROLOGIC: Cranial nerves II through XII are intact. Muscle strength 5/5 in all extremities. Sensation intact. Gait not checked.  PSYCHIATRIC: The patient is alert and oriented x 3.  SKIN: No obvious rash, lesion, or ulcer.   LABORATORY PANEL:   CBC Recent Labs  Lab 02/22/19 0325 02/28/19 1339  WBC 6.6 17.2*  HGB 8.1* 9.2*  HCT 25.3* 30.1*  PLT 139* 267  MCV 96.6 102.4*  MCH 30.9 31.3  MCHC 32.0 30.6  RDW 15.5 15.3  LYMPHSABS  --  1.5  MONOABS  --  0.7  EOSABS  --  0.1  BASOSABS  --  0.1   ------------------------------------------------------------------------------------------------------------------  Chemistries  Recent Labs  Lab 02/22/19 0325 02/22/19 1241 02/23/19 0401 02/24/19 0531 02/28/19 1339  NA 143  --  139 140 140  K 2.9* 4.1 6.4* 4.3 3.5  CL 100  --  105 98 102  CO2  30  --  22 29 19*  GLUCOSE 98  --  116* 92 182*  BUN 27*  --  47* 36* 54*  CREATININE 4.40*  --  6.28* 4.64* 7.60*  CALCIUM 8.0*  --  8.6* 8.6* 7.5*  MG 1.7  --   --   --   --   AST  --   --   --   --  39  ALT  --   --   --   --  47*  ALKPHOS  --   --   --   --  96  BILITOT  --   --   --   --  0.7   ------------------------------------------------------------------------------------------------------------------ estimated creatinine clearance is 8.7 mL/min (A) (by C-G formula based on SCr of 7.6 mg/dL (H)). ------------------------------------------------------------------------------------------------------------------ No results for input(s): TSH, T4TOTAL, T3FREE, THYROIDAB in the last 72 hours.  Invalid input(s): FREET3   Coagulation profile No results for input(s): INR, PROTIME in the last 168 hours. ------------------------------------------------------------------------------------------------------------------- No results  for input(s): DDIMER in the last 72 hours. -------------------------------------------------------------------------------------------------------------------  Cardiac Enzymes Recent Labs  Lab 02/22/19 2151 02/23/19 0401 02/28/19 1339  TROPONINI <0.03 <0.03 0.07*   ------------------------------------------------------------------------------------------------------------------ Invalid input(s): POCBNP  ---------------------------------------------------------------------------------------------------------------  Urinalysis No results found for: COLORURINE, APPEARANCEUR, LABSPEC, PHURINE, GLUCOSEU, HGBUR, BILIRUBINUR, KETONESUR, PROTEINUR, UROBILINOGEN, NITRITE, LEUKOCYTESUR   RADIOLOGY: Dg Chest Portable 1 View  Result Date: 02/28/2019 CLINICAL DATA:  Respiratory distress and shortness of breath. Patient with end-stage renal disease. EXAM: PORTABLE CHEST 1 VIEW COMPARISON:  02/21/2019 and prior chest radiographs FINDINGS: Bilateral interstitial/mild airspace opacities are again noted. New superimposed RIGHT LOWER lung airspace opacity is now identified. No pleural effusion or pneumothorax. No acute bony abnormalities. Mild cardiomegaly again noted. IMPRESSION: 1. New RIGHT LOWER lung airspace opacity superimposed on unchanged bilateral interstitial/mild airspace opacities (likely edema), which may represent new focal edema versus pneumonia. 2. Mild cardiomegaly Electronically Signed   By: Margarette Canada M.D.   On: 02/28/2019 14:26    EKG: Orders placed or performed during the hospital encounter of 02/28/19  . ED EKG  . ED EKG    IMPRESSION AND PLAN:  *Acute respiratory failure Secondary to healthcare associated pneumonia Sepsis  Currently on BiPAP, admit to stepdown unit. Cultures are sent by ER. Continue broad-spectrum antibiotic. Check MRSA. COVID-19 test is negative. Further management as per intensivist.  *End-stage renal disease on hemodialysis Patient missed  hemodialysis today. I contacted nephrologist.-Dr. Juleen China is aware.  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 I will continue Eliquis while here.  *Hypertension Continue home meds.  *Active smoking Counseled to quit smoking for 4 minutes and offered nicotine patch.  All the records are reviewed and case discussed with ED provider. Management plans discussed with the patient, family and they are in agreement.  CODE STATUS: Full code. Code Status History    Date Active Date Inactive Code Status Order ID Comments User Context   02/21/2019 0835 02/24/2019 1945 Full Code [id number]  Harrie Foreman, MD Inpatient   02/04/2019 1343 02/09/2019 2050 Full Code [id number]  Saundra Shelling, MD ED   01/27/2019 0654 01/29/2019 2049 Partial Code [id number]  Harrie Foreman, MD Inpatient   01/07/2019 2156 01/27/2019 0923 Partial Code [id number]  Tennis Ship, MD Inpatient   12/25/2018 0158 01/07/2019 2027 Partial Code [id number]  Ina Homes, MD ED      I discussed the case  with Dr. Jamal Collin on phone TOTAL TIME TAKING CARE OF THIS PATIENT: 50 minutes.    Vaughan Basta M.D on 02/28/2019   Between 7am to 6pm - Pager - [phone]  After 6pm go to [email or web address] - password [email or web address] [email or web address]  Office  [phone]  CC: Primary care physician; Patient, [name]   Note: This dictation was prepared with Dragon dictation along with smaller phrase technology. Any transcriptional errors that result from this process are unintentional.

## 2019-02-28 NOTE — ED Notes (Signed)
Pt given ice chips

## 2019-02-28 NOTE — Consult Note (Signed)
Pharmacy Antibiotic Note  Brett Small is a 43 y.o. male admitted on 02/28/2019 with pneumonia.  Pharmacy has been consulted for Vancomycin and Cefepime dosing. Patient has HD on Tuesdays, Thursdays, and Saturdays.  Plan: 1) Patient receive a 1g Loading dose in the ED.  Will order Vancomycin 500mg  to be given post-HD days only(Tuesday, Thursday, and Saturday)  2) Cefepime 1g Q24 hours   Height: 5\' 2"  (157.5 cm) Weight: 110 lb 14.3 oz (50.3 kg) IBW/kg (Calculated) : 54.6  Temp (24hrs), Avg:99.3 F (37.4 C), Min:98.9 F (37.2 C), Max:99.7 F (37.6 C)  Recent Labs  Lab 02/22/19 0325 02/23/19 0401 02/24/19 0531 02/28/19 1338 02/28/19 1339  WBC 6.6  --   --   --  17.2*  CREATININE 4.40* 6.28* 4.64*  --  7.60*  LATICACIDVEN  --   --   --  6.5*  --     Estimated Creatinine Clearance: 8.7 mL/min (A) (by C-G formula based on SCr of 7.6 mg/dL (H)).    Allergies  Allergen Reactions  . Codeine Nausea Only    Patient questioned this (??)  . Sulfa Antibiotics Hives and Nausea And Vomiting    Antimicrobials this admission: Vancomycin 5/9 >>  Cefepime 5/9 >>   Dose adjustments this admission: Hemodialysis Patient  Microbiology results: 5/9 BCx: pending 5/9 COVID: negative 5/9 MRSA PCR: pending  Thank you for allowing pharmacy to be a part of this patient's care.  Pearla Dubonnet, PharmD Clinical Pharmacist 02/28/2019 6:12 PM

## 2019-02-28 NOTE — ED Notes (Addendum)
ED TO INPATIENT HANDOFF REPORT  ED Nurse Name and Phone #: Carma Lair  S Name/Age/Gender Brett Small 43 y.o. male Room/Bed: ED24A/ED24A  Code Status   Code Status: Prior  Home/SNF/Other SNF Patient oriented to: self, place, time and situation Is this baseline? Yes   Triage Complete: Triage complete  Chief Complaint respiratory distress  Triage Note Pt arrives emergency traffic for resp distress. Dialysis pt. Fistula L arm. States SOB began today. Placed on bipap upon arrival. Tachy in 140's.    Allergies Allergies  Allergen Reactions  . Codeine Nausea Only    Patient questioned this (??)  . Sulfa Antibiotics Hives and Nausea And Vomiting    Level of Care/Admitting Diagnosis ED Disposition    ED Disposition Condition Comment   Admit  The patient appears reasonably stabilized for admission considering the current resources, flow, and capabilities available in the ED at this time, and I doubt any other [facility] requiring further screening and/or treatment in the ED prior to admission is  present.       B Medical/Surgery History Past Medical History:  Diagnosis Date  . Hypertension   . Renal disorder    Past Surgical History:  Procedure Laterality Date  . A/V SHUNT INTERVENTION Left 01/19/2019   Procedure: LEFT UPPER EXTREMITY A/V SHUNTOGRAM / UPPER EXTREMITY ANGIOGRAM;  Surgeon: Algernon Huxley, MD;  Location: [facility];  Service: Cardiovascular;  Laterality: Left;  . AORTA - FEMORAL ARTERY BYPASS GRAFT    . AV FISTULA PLACEMENT Left 12/26/2018   Procedure: INSERTION OF GORE STRETCH VASCULAR 4-7MM X  45CM IN LEFT UPPER ARM;  Surgeon: Marty Heck, MD;  Location: [facility];  Service: Vascular;  Laterality: Left;  . DIALYSIS/PERMA CATHETER REMOVAL N/A 02/06/2019   Procedure: DIALYSIS/PERMA CATHETER REMOVAL;  Surgeon: Algernon Huxley, MD;  Location: [facility];  Service: Cardiovascular;  Laterality: N/A;     A IV Location/Drains/Wounds Patient  Lines/Drains/Airways Status   Active Line/Drains/Airways    Name:   Placement date:   Placement time:   Site:   Days:   Peripheral IV 02/28/19 Right Hand   02/28/19    1403    Hand   less than 1   Fistula / Graft Left Upper arm Arteriovenous vein graft   12/26/18    1024    Upper arm   64          Intake/Output Last 24 hours No intake or output data in the 24 hours ending 02/28/19 1654  Labs/Imaging Results for orders placed or performed during the hospital encounter of 02/28/19 (from the past 48 hour(s))  Lactic acid, plasma     Status: Abnormal   Collection Time: 02/28/19  1:38 PM  Result Value Ref Range   Lactic Acid, Venous 6.5 (HH) 0.5 - 1.9 mmol/L    Comment: CRITICAL RESULT CALLED TO, READ BACK BY AND VERIFIED WITH DEE Marketia Stallsmith AT 1423 02/28/2019.PMF Performed at [facility], [address] [place], [place] [address]   CBC with Differential     Status: Abnormal   Collection Time: 02/28/19  1:39 PM  Result Value Ref Range   WBC 17.2 (H) 4.0 - 10.5 K/uL   RBC 2.94 (L) 4.22 - 5.81 MIL/uL   Hemoglobin 9.2 (L) 13.0 - 17.0 g/dL   HCT 30.1 (L) 39.0 - 52.0 %   MCV 102.4 (H) 80.0 - 100.0 fL   MCH 31.3 26.0 - 34.0 pg   MCHC 30.6 30.0 - 36.0  g/dL   RDW 15.3 11.5 - 15.5 %   Platelets 267 150 - 400 K/uL   nRBC 0.5 (H) 0.0 - 0.2 %   Neutrophils Relative % 84 %   Neutro Abs 14.6 (H) 1.7 - 7.7 K/uL   Lymphocytes Relative 9 %   Lymphs Abs 1.5 0.7 - 4.0 K/uL   Monocytes Relative 4 %   Monocytes Absolute 0.7 0.1 - 1.0 K/uL   Eosinophils Relative 1 %   Eosinophils Absolute 0.1 0.0 - 0.5 K/uL   Basophils Relative 1 %   Basophils Absolute 0.1 0.0 - 0.1 K/uL   Immature Granulocytes 1 %   Abs Immature Granulocytes 0.18 (H) 0.00 - 0.07 K/uL    Comment: Performed at [facility], [address] [place], [place] [address]  Troponin I - Once     Status: Abnormal   Collection Time: 02/28/19  1:39 PM  Result Value Ref Range   Troponin I 0.07 (HH) <0.03 ng/mL     Comment: CRITICAL RESULT CALLED TO, READ BACK BY AND VERIFIED WITH DEE Versia Mignogna AT 1423 02/28/2019.PMF Performed at [facility], [address] [place], [place] [address]   Brain natriuretic peptide     Status: Abnormal   Collection Time: 02/28/19  1:39 PM  Result Value Ref Range   B Natriuretic Peptide 3,471.0 (H) 0.0 - 100.0 pg/mL    Comment: Performed at [facility], [address] [place], [place] [address]  Comprehensive metabolic panel     Status: Abnormal   Collection Time: 02/28/19  1:39 PM  Result Value Ref Range   Sodium 140 135 - 145 mmol/L   Potassium 3.5 3.5 - 5.1 mmol/L   Chloride 102 98 - 111 mmol/L   CO2 19 (L) 22 - 32 mmol/L   Glucose, Bld 182 (H) 70 - 99 mg/dL   BUN 54 (H) 6 - 20 mg/dL   Creatinine, Ser 7.60 (H) 0.61 - 1.24 mg/dL   Calcium 7.5 (L) 8.9 - 10.3 mg/dL   Total Protein 7.6 6.5 - 8.1 g/dL   Albumin 3.6 3.5 - 5.0 g/dL   AST 39 15 - 41 U/L   ALT 47 (H) 0 - 44 U/L   Alkaline Phosphatase 96 38 - 126 U/L   Total Bilirubin 0.7 0.3 - 1.2 mg/dL   GFR calc non Af Amer 8 (L) >60 mL/min   GFR calc Af Amer 9 (L) >60 mL/min   Anion gap 19 (H) 5 - 15    Comment: Performed at [facility], [address] [place], [place] [address]  SARS Coronavirus 2 (CEPHEID - Performed in [place] hospital lab), Hosp Order     Status: None   Collection Time: 02/28/19  2:05 PM  Result Value Ref Range   SARS Coronavirus 2 NEGATIVE NEGATIVE    Comment: (NOTE) If result is NEGATIVE SARS-CoV-2 target nucleic acids are NOT DETECTED. The SARS-CoV-2 RNA is generally detectable in upper and lower  respiratory specimens during the acute phase of infection. The lowest  concentration of SARS-CoV-2 viral copies this assay can detect is 250  copies / mL. A negative result does not preclude SARS-CoV-2 infection  and should not be used as the sole basis for treatment or other  patient management decisions.  A negative result may occur with  improper specimen  collection / handling, submission of specimen other  than nasopharyngeal swab, presence of viral mutation(s) within the  areas targeted by this assay, and inadequate number of  viral copies  (<250 copies / mL). A negative result must be combined with clinical  observations, patient history, and epidemiological information. If result is POSITIVE SARS-CoV-2 target nucleic acids are DETECTED. The SARS-CoV-2 RNA is generally detectable in upper and lower  respiratory specimens dur ing the acute phase of infection.  Positive  results are indicative of active infection with SARS-CoV-2.  Clinical  correlation with patient history and other diagnostic information is  necessary to determine patient infection status.  Positive results do  not rule out bacterial infection or co-infection with other viruses. If result is PRESUMPTIVE POSTIVE SARS-CoV-2 nucleic acids MAY BE PRESENT.   A presumptive positive result was obtained on the submitted specimen  and confirmed on repeat testing.  While 2019 novel coronavirus  (SARS-CoV-2) nucleic acids may be present in the submitted sample  additional confirmatory testing may be necessary for epidemiological  and / or clinical management purposes  to differentiate between  SARS-CoV-2 and other Sarbecovirus currently known to infect humans.  If clinically indicated additional testing with an alternate test  methodology [phone]) is advised. The SARS-CoV-2 RNA is generally  detectable in upper and lower respiratory sp ecimens during the acute  phase of infection. The expected result is Negative. Fact Sheet for Patients:  StrictlyIdeas.no Fact Sheet for Healthcare Providers: BankingDealers.co.za This test is not yet approved or cleared by the [place] FDA and has been authorized for detection and/or diagnosis of SARS-CoV-2 by FDA under an Emergency Use Authorization (EUA).  This EUA will remain in effect  (meaning this test can be used) for the duration of the COVID-19 declaration under Section 564(b)(1) of the Act, 21 U.S.C. section 360bbb-3(b)(1), unless the authorization is terminated or revoked sooner. Performed at [facility], [address], [place], [place] [address]    Dg Chest Portable 1 View  Result Date: 02/28/2019 CLINICAL DATA:  Respiratory distress and shortness of breath. Patient with end-stage renal disease. EXAM: PORTABLE CHEST 1 VIEW COMPARISON:  02/21/2019 and prior chest radiographs FINDINGS: Bilateral interstitial/mild airspace opacities are again noted. New superimposed RIGHT LOWER lung airspace opacity is now identified. No pleural effusion or pneumothorax. No acute bony abnormalities. Mild cardiomegaly again noted. IMPRESSION: 1. New RIGHT LOWER lung airspace opacity superimposed on unchanged bilateral interstitial/mild airspace opacities (likely edema), which may represent new focal edema versus pneumonia. 2. Mild cardiomegaly Electronically Signed   By: Margarette Canada M.D.   On: 02/28/2019 14:26    Pending Labs Unresulted Labs (From admission, onward)    Start     Ordered   02/28/19 1330  Lactic acid, plasma  Now then every 2 hours,   STAT     02/28/19 1329   02/28/19 1330  Culture, blood (routine x 2)  BLOOD CULTURE X 2,   STAT     02/28/19 1330   02/28/19 1330  Urinalysis, Complete w Microscopic  ONCE - STAT,   STAT     02/28/19 1330          Vitals/Pain Today's Vitals   02/28/19 1434 02/28/19 1500 02/28/19 1600 02/28/19 1630  BP: (!) 162/108 136/79 (!) 143/98 (!) 149/96  Pulse: (!) 118 (!) 110 (!) 110 (!) 105  Resp: 15 15 17 16   Temp:      TempSrc:      SpO2: 98% 100% 100% 100%  Weight:      Height:      PainSc:        Isolation Precautions No active isolations  Medications Medications  nitroGLYCERIN 50 mg in dextrose 5 % 250 mL (0.2 mg/mL) infusion (0 mcg/min Intravenous Stopped 02/28/19 1604)  vancomycin (VANCOCIN) IVPB 1000 mg/200 mL  premix (1,000 mg Intravenous New Bag/Given 02/28/19 1652)  ceFEPIme (MAXIPIME) 2 g in sodium chloride 0.9 % 100 mL IVPB (0 g Intravenous Stopped 02/28/19 1652)    Mobility walks with device Low fall risk   Focused Assessments    R Recommendations: See Admitting Provider Note  Report given to: Levada Dy, RN

## 2019-02-28 NOTE — ED Notes (Signed)
One of cultures collected by this RN via [place] IV. Unable to save IV.

## 2019-02-28 NOTE — Progress Notes (Signed)
[facility]  ROUNDING NOTE   Subjective:   Mr. Manoah Deckard admitted to [facility] on 02/28/2019 for respiratory distress  Patient's last hemodialysis treatment was on 5/7 where he left at 45.8kg.   Missed dialysis today and presents with pulmonary edema and respiratory distress.   Objective:  Vital signs in last 24 hours:  Temp:  [98.9 F (37.2 C)] 98.9 F (37.2 C) (05/09 1415) Pulse Rate:  [102-136] 102 (05/09 1700) Resp:  [14-27] 14 (05/09 1700) BP: (136-195)/(79-115) 150/85 (05/09 1700) SpO2:  [93 %-100 %] 97 % (05/09 1711) Weight:  [49.9 kg] 49.9 kg (05/09 1334)  Weight change:  Filed Weights   02/28/19 1334  Weight: 49.9 kg    Intake/Output: No intake/output data recorded.   Intake/Output this shift:  No intake/output data recorded.  Physical Exam: General:  No acute distress  Head: Normocephalic, atraumatic.  Eyes: Anicteric  Neck: Supple  Lungs:  clear  Heart: Regular, no rubs  Abdomen:  Soft, nontender, bowel sounds present  Extremities: no peripheral edema.  Neurologic: Awake, alert, follows commands  Skin: No lesions  Access: left arm AVG      Basic Metabolic Panel: Recent Labs  Lab 02/22/19 0325 02/22/19 1241 02/23/19 0401 02/23/19 1018 02/24/19 0531 02/28/19 1339  NA 143  --  139  --  140 140  K 2.9* 4.1 6.4*  --  4.3 3.5  CL 100  --  105  --  98 102  CO2 30  --  22  --  29 19*  GLUCOSE 98  --  116*  --  92 182*  BUN 27*  --  47*  --  36* 54*  CREATININE 4.40*  --  6.28*  --  4.64* 7.60*  CALCIUM 8.0*  --  8.6*  --  8.6* 7.5*  MG 1.7  --   --   --   --   --   PHOS 4.1  --   --  3.2  --   --     Liver Function Tests: Recent Labs  Lab 02/28/19 1339  AST 39  ALT 47*  ALKPHOS 96  BILITOT 0.7  PROT 7.6  ALBUMIN 3.6   No results for input(s): LIPASE, AMYLASE in the last 168 hours. No results for input(s): AMMONIA in the last 168 hours.  CBC: Recent Labs  Lab 02/22/19 0325 02/28/19 1339  WBC 6.6 17.2*  NEUTROABS   --  14.6*  HGB 8.1* 9.2*  HCT 25.3* 30.1*  MCV 96.6 102.4*  PLT 139* 267    Cardiac Enzymes: Recent Labs  Lab 02/22/19 1605 02/22/19 2151 02/23/19 0401 02/28/19 1339  TROPONINI <0.03 <0.03 <0.03 0.07*    BNP: Invalid input(s): POCBNP  CBG: No results for input(s): GLUCAP in the last 168 hours.  Microbiology: Results for orders placed or performed during the hospital encounter of 02/28/19  SARS Coronavirus 2 (CEPHEID - Performed in [facility] hospital lab), Hosp Order     Status: None   Collection Time: 02/28/19  2:05 PM  Result Value Ref Range Status   SARS Coronavirus 2 NEGATIVE NEGATIVE Final    Comment: (NOTE) If result is NEGATIVE SARS-CoV-2 target nucleic acids are NOT DETECTED. The SARS-CoV-2 RNA is generally detectable in upper and lower  respiratory specimens during the acute phase of infection. The lowest  concentration of SARS-CoV-2 viral copies this assay can detect is 250  copies / mL. A negative result does not preclude SARS-CoV-2 infection  and should not be used  as the sole basis for treatment or other  patient management decisions.  A negative result may occur with  improper specimen collection / handling, submission of specimen other  than nasopharyngeal swab, presence of viral mutation(s) within the  areas targeted by this assay, and inadequate number of viral copies  (<250 copies / mL). A negative result must be combined with clinical  observations, patient history, and epidemiological information. If result is POSITIVE SARS-CoV-2 target nucleic acids are DETECTED. The SARS-CoV-2 RNA is generally detectable in upper and lower  respiratory specimens dur ing the acute phase of infection.  Positive  results are indicative of active infection with SARS-CoV-2.  Clinical  correlation with patient history and other diagnostic information is  necessary to determine patient infection status.  Positive results do  not rule out bacterial infection or  co-infection with other viruses. If result is PRESUMPTIVE POSTIVE SARS-CoV-2 nucleic acids MAY BE PRESENT.   A presumptive positive result was obtained on the submitted specimen  and confirmed on repeat testing.  While 2019 novel coronavirus  (SARS-CoV-2) nucleic acids may be present in the submitted sample  additional confirmatory testing may be necessary for epidemiological  and / or clinical management purposes  to differentiate between  SARS-CoV-2 and other Sarbecovirus currently known to infect humans.  If clinically indicated additional testing with an alternate test  methodology [phone]) is advised. The SARS-CoV-2 RNA is generally  detectable in upper and lower respiratory sp ecimens during the acute  phase of infection. The expected result is Negative. Fact Sheet for Patients:  StrictlyIdeas.no Fact Sheet for Healthcare Providers: BankingDealers.co.za This test is not yet approved or cleared by the [place] FDA and has been authorized for detection and/or diagnosis of SARS-CoV-2 by FDA under an Emergency Use Authorization (EUA).  This EUA will remain in effect (meaning this test can be used) for the duration of the COVID-19 declaration under Section 564(b)(1) of the Act, 21 U.S.C. section 360bbb-3(b)(1), unless the authorization is terminated or revoked sooner. Performed at [facility], [address] [place], [place] [address]     Coagulation Studies: No results for input(s): LABPROT, INR in the last 72 hours.  Urinalysis: No results for input(s): COLORURINE, LABSPEC, PHURINE, GLUCOSEU, HGBUR, BILIRUBINUR, KETONESUR, PROTEINUR, UROBILINOGEN, NITRITE, LEUKOCYTESUR in the last 72 hours.  Invalid input(s): APPERANCEUR    Imaging: Dg Chest Portable 1 View  Result Date: 02/28/2019 CLINICAL DATA:  Respiratory distress and shortness of breath. Patient with end-stage renal disease. EXAM: PORTABLE CHEST 1 VIEW  COMPARISON:  02/21/2019 and prior chest radiographs FINDINGS: Bilateral interstitial/mild airspace opacities are again noted. New superimposed RIGHT LOWER lung airspace opacity is now identified. No pleural effusion or pneumothorax. No acute bony abnormalities. Mild cardiomegaly again noted. IMPRESSION: 1. New RIGHT LOWER lung airspace opacity superimposed on unchanged bilateral interstitial/mild airspace opacities (likely edema), which may represent new focal edema versus pneumonia. 2. Mild cardiomegaly Electronically Signed   By: Margarette Canada M.D.   On: 02/28/2019 14:26     Medications:   . nitroGLYCERIN Stopped (02/28/19 1604)  . vancomycin 1,000 mg (02/28/19 1652)   . ipratropium-albuterol  3 mL Nebulization Q4H     Assessment/ Plan:  Mr. Jonn Chaikin is a 43 y.o. white male with end stage renal disease on hemodialysis, hypertension, depression, aortobifemoral bypass  [place]. TTS left AVG 46.5kg 2K bath  1.  End stage renal disease on hemodialysis : last hemodialysis treatment on 5/7.  Emergent dialysis tonight Complication with AVF  with no bruit or thrill - Consult vascular for temp HD catheter placement.   2.  Anemia of chronic kidney disease: hemoglobin 9.2 - EPO with TTS HD treatments.  - IV iron as outpatient.   4.  Secondary hyperparathyroidism. Phosphorus and calcium at goal. Labs from 4/23 PTH 342 - at goal.  - Continue sevelamer 1600 mg p.o. 3 times daily.  5.  Hypertension: Home regimen of amlodipine, hydralazine and carvedilol. Start furosemide on discharge.    LOS: 0 Abdishakur Gottschall 5/9/20205:48 PM

## 2019-02-28 NOTE — Progress Notes (Signed)
Family Meeting Note  Advance Directive:yes  Today a meeting took place with the Patient.  The following clinical team members were present during this meeting:MD  The following were discussed:Patient's diagnosis: Dialysis patient, acute respiratory failure and sepsis with pneumonia, Patient's progosis: Unable to determine and Goals for treatment: Full Code  Additional follow-up to be provided: Intensivist, nephrology  Time spent during discussion:20 minutes  Vaughan Basta, MD

## 2019-02-28 NOTE — Progress Notes (Signed)
Pt removed from bipap per MD request. Pt placed on 3L [place], tol well with sat >95%, RR <15, and HR 110. Will continue to monitor

## 2019-02-28 NOTE — ED Provider Notes (Signed)
[facility] Emergency Department Provider Note ____________________________________________   First MD Initiated Contact with Patient 02/28/19 1329     (approximate)  I have reviewed the triage vital signs and the nursing notes.   HISTORY  Chief Complaint Respiratory Distress  Level 5 caveat: History of present illness limited due to respiratory distress  HPI Brett Small is a 43 y.o. male with PMH as noted below who presents with shortness of breath, acute onset this morning and worsening while he was at dialysis.  Per EMS, the patient's O2 saturation was in the mid 80s on 15 L by nonrebreather mask.  The patient is unable to give much further history.  He denies fever or GI symptoms.  Past Medical History:  Diagnosis Date  . Hypertension   . Renal disorder     Patient Active Problem List   Diagnosis Date Noted  . Acute respiratory failure ([place]) 02/04/2019  . Acute respiratory failure with hypoxemia ([place]) 01/27/2019  . Depression 01/07/2019  . MDD (major depressive disorder), single episode, severe , no psychosis ([place])   . Homelessness   . Acute respiratory failure with hypoxia ([place]) 12/25/2018  . End stage renal disease ([place]) 12/25/2018  . Hypertension 12/25/2018  . Renal osteodystrophy 12/25/2018    Past Surgical History:  Procedure Laterality Date  . A/V SHUNT INTERVENTION Left 01/19/2019   Procedure: LEFT UPPER EXTREMITY A/V SHUNTOGRAM / UPPER EXTREMITY ANGIOGRAM;  Surgeon: Algernon Huxley, MD;  Location: [place] [facility];  Service: Cardiovascular;  Laterality: Left;  . AORTA - FEMORAL ARTERY BYPASS GRAFT    . AV FISTULA PLACEMENT Left 12/26/2018   Procedure: INSERTION OF GORE STRETCH VASCULAR 4-7MM X  45CM IN LEFT UPPER ARM;  Surgeon: Marty Heck, MD;  Location: [place];  Service: Vascular;  Laterality: Left;  . DIALYSIS/PERMA CATHETER REMOVAL N/A 02/06/2019   Procedure: DIALYSIS/PERMA CATHETER REMOVAL;  Surgeon: Algernon Huxley, MD;   Location: [place] [facility];  Service: Cardiovascular;  Laterality: N/A;    Prior to Admission medications   Medication Sig Start Date End Date Taking? Authorizing Provider  amLODipine (NORVASC) 10 MG tablet Take 1 tablet (10 mg total) by mouth daily at 8 pm. 02/24/19   Gladstone Lighter, MD  apixaban (ELIQUIS) 2.5 MG TABS tablet Take 1 tablet (2.5 mg total) by mouth 2 (two) times daily. 01/26/19   Clapacs, Madie Reno, MD  aspirin EC 81 MG tablet Take 1 tablet (81 mg total) by mouth daily. 01/26/19   Clapacs, Madie Reno, MD  atorvastatin (LIPITOR) 80 MG tablet Take 1 tablet (80 mg total) by mouth at bedtime. 01/26/19   Clapacs, Madie Reno, MD  carvedilol (COREG) 25 MG tablet Take 1 tablet (25 mg total) by mouth 2 (two) times daily with a meal. 01/26/19   Clapacs, Madie Reno, MD  doxycycline (VIBRAMYCIN) 100 MG capsule Take 1 capsule (100 mg total) by mouth 2 (two) times daily for 7 days. 02/24/19 03/03/19  Gladstone Lighter, MD  furosemide (LASIX) 80 MG tablet Take 1 tablet (80 mg total) by mouth every Monday,Wednesday,Friday, and Sunday at 6 PM. Non dialysis days 02/25/19 02/25/20  Gladstone Lighter, MD  gabapentin (NEURONTIN) 100 MG capsule Take 1 capsule (100 mg total) by mouth 3 (three) times daily. 01/26/19   Clapacs, Madie Reno, MD  hydrALAZINE (APRESOLINE) 25 MG tablet Take 1 tablet (25 mg total) by mouth every 8 (eight) hours. 01/26/19   Clapacs, Madie Reno, MD  hydrOXYzine (ATARAX/VISTARIL) 25 MG tablet Take 25  mg by mouth 3 (three) times daily as needed for anxiety.    [provider]  multivitamin (RENA-VIT) TABS tablet Take 1 tablet by mouth daily. 01/27/19   Clapacs, Madie Reno, MD  neomycin-bacitracin-polymyxin (NEOSPORIN) OINT Apply 1 application topically 2 (two) times daily. 01/26/19   Clapacs, Madie Reno, MD  sevelamer carbonate (RENVELA) 800 MG tablet Take 2 tablets (1,600 mg total) by mouth 3 (three) times daily with meals. 01/26/19   Clapacs, Madie Reno, MD    Allergies Codeine and Sulfa antibiotics  Family History   Problem Relation Age of Onset  . Hypertension Other   . Diabetes Other   . Clotting disorder Father     Social History Social History   Tobacco Use  . Smoking status: Current Every Day Smoker    Last attempt to quit: 06/26/2018    Years since quitting: 0.6  . Smokeless tobacco: Never Used  . Tobacco comment: smoked for 30 years   Substance Use Topics  . Alcohol use: Not Currently  . Drug use: Never    Review of Systems Level 5 caveat: Unable to obtain review of systems due to respiratory distress    ____________________________________________   PHYSICAL EXAM:  VITAL SIGNS: ED Triage Vitals  Enc Vitals Group     BP 02/28/19 1334 (!) 179/113     Pulse Rate 02/28/19 1334 (!) 136     Resp 02/28/19 1334 (!) 27     Temp 02/28/19 1415 98.9 F (37.2 C)     Temp Source 02/28/19 1415 Axillary     SpO2 02/28/19 1334 93 %     Weight 02/28/19 1334 109 lb 14.4 oz (49.9 kg)     Height 02/28/19 1334 5\' 2"  (1.575 m)     Head Circumference --      Peak Flow --      Pain Score 02/28/19 1336 8     Pain Loc --      Pain Edu? --      Excl. in [place]? --     Constitutional: Alert and oriented.  Uncomfortable appearing, in acute respiratory distress  Eyes: Conjunctivae are normal.  Head: Atraumatic. Nose: No congestion/rhinnorhea. Mouth/Throat: Mucous membranes are dry.   Neck: Normal range of motion.  Cardiovascular: Tachycardic, regular rhythm. Grossly normal heart sounds.  Good peripheral circulation. Respiratory: Acute respiratory distress with markedly increased work of breathing and diffuse rales bilaterally. Gastrointestinal: No distention.  Musculoskeletal: No lower extremity edema.  Extremities warm and well perfused.  Neurologic: Motor intact in all extremities. Skin:  Skin is warm and dry. No rash noted. Psychiatric: Anxious appearing. ____________________________________________   LABS (all labs ordered are listed, but only abnormal results are displayed)  Labs  Reviewed  CBC WITH DIFFERENTIAL/PLATELET - Abnormal; Notable for the following components:      Result Value   WBC 17.2 (*)    RBC 2.94 (*)    Hemoglobin 9.2 (*)    HCT 30.1 (*)    MCV 102.4 (*)    nRBC 0.5 (*)    Neutro Abs 14.6 (*)    Abs Immature Granulocytes 0.18 (*)    All other components within normal limits  TROPONIN I - Abnormal; Notable for the following components:   Troponin I 0.07 (*)    All other components within normal limits  LACTIC ACID, PLASMA - Abnormal; Notable for the following components:   Lactic Acid, Venous 6.5 (*)    All other components within normal limits  BRAIN NATRIURETIC PEPTIDE -  Abnormal; Notable for the following components:   B Natriuretic Peptide 3,471.0 (*)    All other components within normal limits  COMPREHENSIVE METABOLIC PANEL - Abnormal; Notable for the following components:   CO2 19 (*)    Glucose, Bld 182 (*)    BUN 54 (*)    Creatinine, Ser 7.60 (*)    Calcium 7.5 (*)    ALT 47 (*)    GFR calc non Af Amer 8 (*)    GFR calc Af Amer 9 (*)    Anion gap 19 (*)    All other components within normal limits  SARS CORONAVIRUS 2 (HOSPITAL ORDER, [place] [facility])  CULTURE, BLOOD (ROUTINE X 2)  CULTURE, BLOOD (ROUTINE X 2)  LACTIC ACID, PLASMA  URINALYSIS, COMPLETE (UACMP) WITH MICROSCOPIC   ____________________________________________  EKG  ED ECG REPORT I, Arta Silence, the attending physician, personally viewed and interpreted this ECG.  Date: 02/28/2019 EKG Time: 1326 Rate: 145 Rhythm: Sinus tachycardia QRS Axis: normal Intervals: Prolonged QT ST/T Wave abnormalities: Tall T waves anteriorly Narrative Interpretation: no evidence of acute ischemia  ____________________________________________  RADIOLOGY  CXR: Interstitial opacities with new right lower lung opacity  ____________________________________________   PROCEDURES  Procedure(s) performed: No  Procedures  Critical Care  performed: Yes  CRITICAL CARE Performed by: Arta Silence   Total critical care time: 45 minutes  Critical care time was exclusive of separately billable procedures and treating other patients.  Critical care was necessary to treat or prevent imminent or life-threatening deterioration.  Critical care was time spent personally by me on the following activities: development of treatment plan with patient and/or surrogate as well as nursing, discussions with consultants, evaluation of patient's response to treatment, examination of patient, obtaining history from patient or surrogate, ordering and performing treatments and interventions, ordering and review of laboratory studies, ordering and review of radiographic studies, pulse oximetry and re-evaluation of patient's condition. ____________________________________________   INITIAL IMPRESSION / ASSESSMENT AND PLAN / ED COURSE  Pertinent labs & imaging results that were available during my care of the patient were reviewed by me and considered in my medical decision making (see chart for details).  43 year old male with PMH as noted above including ESRD on dialysis and CHF presents with acute respiratory distress after developing shortness of breath earlier today.  He came from dialysis, but was not able to complete any of the dialysis session.  He denies fever.  I reviewed the past medical records in [place].  The patient was admitted 1 week ago with respiratory failure requiring BiPAP.  He went home on antibiotics for possible CAP.  COVID-19 test was negative.  On exam today the patient is in acute respiratory distress.  O2 saturation was in the 80s on nonrebreather and the patient had significantly increased work of breathing and diffuse rales bilaterally.  Presentation was consistent with acute pulmonary edema/fluid overload.  We started the patient on BiPAP and a nitro infusion and he improved rapidly.  We will continue BiPAP and obtain  chest x-ray and lab work-up.  ----------------------------------------- 3:17 PM on 02/28/2019 -----------------------------------------  Work-up reveals elevated lactic acid and possible new right lower lobe infiltrate, so I will cover the patient with antibiotics for HCAP.  I suspect that the lactic acid elevation is also likely due to his increased work of breathing and acute respiratory distress; the patient appears overall fluid overloaded so in my judgment, a large fluid bolus at this time would be detrimental and possibly  could cause worsening respiratory failure.  At this time, the patient is stable on BiPAP with improved blood pressure and heart rate.  COVID-19 is negative.  I signed the patient out to the hospitalist Dr. Anselm Jungling for admission.  ____________________________________________   FINAL CLINICAL IMPRESSION(S) / ED DIAGNOSES  Final diagnoses:  HCAP (healthcare-associated pneumonia)  Acute respiratory failure with hypoxia ([place])  Acute on chronic congestive heart failure, unspecified heart failure type ([place])      NEW MEDICATIONS STARTED DURING THIS VISIT:  New Prescriptions   No medications on file     Note:  This document was prepared using Dragon voice recognition software and may include unintentional dictation errors.    Arta Silence, MD 02/28/19 [phone]

## 2019-02-28 NOTE — Consult Note (Signed)
Reason for Consult: Malfunctioning LEFT Brachial Axillary graft Referring Physician: Dr. Pollie Meyer Heggie is an 43 y.o. male.  HPI: Patient with ESRD on HD T, TH, Sat via a LEFT brachial axillary graft. On 3/30 the patient had a fistulogram, angioplasty and stenting for malfunctioning graft. 4/17 the patient had his infected right IJ permacath removed. His access was not functioning at HD today. Found to have hypoxia, PNA, COVID- negative. Asked to place temporary HD catheter.  Past Medical History:  Diagnosis Date  . Hypertension   . Renal disorder     Past Surgical History:  Procedure Laterality Date  . A/V SHUNT INTERVENTION Left 01/19/2019   Procedure: LEFT UPPER EXTREMITY A/V SHUNTOGRAM / UPPER EXTREMITY ANGIOGRAM;  Surgeon: Algernon Huxley, MD;  Location: [facility];  Service: Cardiovascular;  Laterality: Left;  . AORTA - FEMORAL ARTERY BYPASS GRAFT    . AV FISTULA PLACEMENT Left 12/26/2018   Procedure: INSERTION OF GORE STRETCH VASCULAR 4-7MM X  45CM IN LEFT UPPER ARM;  Surgeon: Marty Heck, MD;  Location: [facility] [facility];  Service: Vascular;  Laterality: Left;  . DIALYSIS/PERMA CATHETER REMOVAL N/A 02/06/2019   Procedure: DIALYSIS/PERMA CATHETER REMOVAL;  Surgeon: Algernon Huxley, MD;  Location: [facility];  Service: Cardiovascular;  Laterality: N/A;    Family History  Problem Relation Age of Onset  . Hypertension Other   . Diabetes Other   . Clotting disorder Father     Social History:  reports that he has been smoking. He has never used smokeless tobacco. He reports previous alcohol use. He reports that he does not use drugs.  Allergies:  Allergies  Allergen Reactions  . Codeine Nausea Only    Patient questioned this (??)  . Sulfa Antibiotics Hives and Nausea And Vomiting    Medications: I have reviewed the patient's current medications.  Results for orders placed or performed during the hospital encounter of 02/28/19 (from the past 48 hour(s))   Lactic acid, plasma     Status: Abnormal   Collection Time: 02/28/19  1:38 PM  Result Value Ref Range   Lactic Acid, Venous 6.5 (HH) 0.5 - 1.9 mmol/L    Comment: CRITICAL RESULT CALLED TO, READ BACK BY AND VERIFIED WITH DEE MCCLAIN AT 8891 02/28/2019.PMF Performed at [facility], [address] [place], [place] [address]   CBC with Differential     Status: Abnormal   Collection Time: 02/28/19  1:39 PM  Result Value Ref Range   WBC 17.2 (H) 4.0 - 10.5 K/uL   RBC 2.94 (L) 4.22 - 5.81 MIL/uL   Hemoglobin 9.2 (L) 13.0 - 17.0 g/dL   HCT 30.1 (L) 39.0 - 52.0 %   MCV 102.4 (H) 80.0 - 100.0 fL   MCH 31.3 26.0 - 34.0 pg   MCHC 30.6 30.0 - 36.0 g/dL   RDW 15.3 11.5 - 15.5 %   Platelets 267 150 - 400 K/uL   nRBC 0.5 (H) 0.0 - 0.2 %   Neutrophils Relative % 84 %   Neutro Abs 14.6 (H) 1.7 - 7.7 K/uL   Lymphocytes Relative 9 %   Lymphs Abs 1.5 0.7 - 4.0 K/uL   Monocytes Relative 4 %   Monocytes Absolute 0.7 0.1 - 1.0 K/uL   Eosinophils Relative 1 %   Eosinophils Absolute 0.1 0.0 - 0.5 K/uL   Basophils Relative 1 %   Basophils Absolute 0.1 0.0 - 0.1 K/uL   Immature Granulocytes 1 %   Abs  Immature Granulocytes 0.18 (H) 0.00 - 0.07 K/uL    Comment: Performed at [facility], [address] [place], [place] [address]  Troponin I - Once     Status: Abnormal   Collection Time: 02/28/19  1:39 PM  Result Value Ref Range   Troponin I 0.07 (HH) <0.03 ng/mL    Comment: CRITICAL RESULT CALLED TO, READ BACK BY AND VERIFIED WITH DEE MCCLAIN AT 1423 02/28/2019.PMF Performed at [facility], [address] [place], [place] [address]   Brain natriuretic peptide     Status: Abnormal   Collection Time: 02/28/19  1:39 PM  Result Value Ref Range   B Natriuretic Peptide 3,471.0 (H) 0.0 - 100.0 pg/mL    Comment: Performed at [facility], [address] [place], [place] [address]  Comprehensive metabolic panel     Status: Abnormal   Collection Time: 02/28/19  1:39  PM  Result Value Ref Range   Sodium 140 135 - 145 mmol/L   Potassium 3.5 3.5 - 5.1 mmol/L   Chloride 102 98 - 111 mmol/L   CO2 19 (L) 22 - 32 mmol/L   Glucose, Bld 182 (H) 70 - 99 mg/dL   BUN 54 (H) 6 - 20 mg/dL   Creatinine, Ser 7.60 (H) 0.61 - 1.24 mg/dL   Calcium 7.5 (L) 8.9 - 10.3 mg/dL   Total Protein 7.6 6.5 - 8.1 g/dL   Albumin 3.6 3.5 - 5.0 g/dL   AST 39 15 - 41 U/L   ALT 47 (H) 0 - 44 U/L   Alkaline Phosphatase 96 38 - 126 U/L   Total Bilirubin 0.7 0.3 - 1.2 mg/dL   GFR calc non Af Amer 8 (L) >60 mL/min   GFR calc Af Amer 9 (L) >60 mL/min   Anion gap 19 (H) 5 - 15    Comment: Performed at [facility], [address], [place], [place] [address]  SARS Coronavirus 2 (CEPHEID - Performed in [place] hospital lab), Hosp Order     Status: None   Collection Time: 02/28/19  2:05 PM  Result Value Ref Range   SARS Coronavirus 2 NEGATIVE NEGATIVE    Comment: (NOTE) If result is NEGATIVE SARS-CoV-2 target nucleic acids are NOT DETECTED. The SARS-CoV-2 RNA is generally detectable in upper and lower  respiratory specimens during the acute phase of infection. The lowest  concentration of SARS-CoV-2 viral copies this assay can detect is 250  copies / mL. A negative result does not preclude SARS-CoV-2 infection  and should not be used as the sole basis for treatment or other  patient management decisions.  A negative result may occur with  improper specimen collection / handling, submission of specimen other  than nasopharyngeal swab, presence of viral mutation(s) within the  areas targeted by this assay, and inadequate number of viral copies  (<250 copies / mL). A negative result must be combined with clinical  observations, patient history, and epidemiological information. If result is POSITIVE SARS-CoV-2 target nucleic acids are DETECTED. The SARS-CoV-2 RNA is generally detectable in upper and lower  respiratory specimens dur ing the acute phase of infection.   Positive  results are indicative of active infection with SARS-CoV-2.  Clinical  correlation with patient history and other diagnostic information is  necessary to determine patient infection status.  Positive results do  not rule out bacterial infection or co-infection with other viruses. If result is PRESUMPTIVE POSTIVE SARS-CoV-2 nucleic acids MAY BE PRESENT.   A presumptive  positive result was obtained on the submitted specimen  and confirmed on repeat testing.  While 2019 novel coronavirus  (SARS-CoV-2) nucleic acids may be present in the submitted sample  additional confirmatory testing may be necessary for epidemiological  and / or clinical management purposes  to differentiate between  SARS-CoV-2 and other Sarbecovirus currently known to infect humans.  If clinically indicated additional testing with an alternate test  methodology [id number]) is advised. The SARS-CoV-2 RNA is generally  detectable in upper and lower respiratory sp ecimens during the acute  phase of infection. The expected result is Negative. Fact Sheet for Patients:  StrictlyIdeas.no Fact Sheet for Healthcare Providers: BankingDealers.co.za This test is not yet approved or cleared by the [place] FDA and has been authorized for detection and/or diagnosis of SARS-CoV-2 by FDA under an Emergency Use Authorization (EUA).  This EUA will remain in effect (meaning this test can be used) for the duration of the COVID-19 declaration under Section 564(b)(1) of the Act, 21 U.S.C. section 360bbb-3(b)(1), unless the authorization is terminated or revoked sooner. Performed at [facility], [address] [place], [place] [address]   Glucose, capillary     Status: Abnormal   Collection Time: 02/28/19  5:55 PM  Result Value Ref Range   Glucose-Capillary 118 (H) 70 - 99 mg/dL  Lactic acid, plasma     Status: Abnormal   Collection Time: 02/28/19  6:14 PM  Result  Value Ref Range   Lactic Acid, Venous 2.3 (HH) 0.5 - 1.9 mmol/L    Comment: CRITICAL RESULT CALLED TO, READ BACK BY AND VERIFIED WITH CATHERINE CLAYTON AT 1843 ON 02/28/2019 JJB Performed at [facility], [address] [place], [place] [address]     Dg Chest Portable 1 View  Result Date: 02/28/2019 CLINICAL DATA:  Respiratory distress and shortness of breath. Patient with end-stage renal disease. EXAM: PORTABLE CHEST 1 VIEW COMPARISON:  02/21/2019 and prior chest radiographs FINDINGS: Bilateral interstitial/mild airspace opacities are again noted. New superimposed RIGHT LOWER lung airspace opacity is now identified. No pleural effusion or pneumothorax. No acute bony abnormalities. Mild cardiomegaly again noted. IMPRESSION: 1. New RIGHT LOWER lung airspace opacity superimposed on unchanged bilateral interstitial/mild airspace opacities (likely edema), which may represent new focal edema versus pneumonia. 2. Mild cardiomegaly Electronically Signed   By: Margarette Canada M.D.   On: 02/28/2019 14:26    Review of Systems  Constitutional: Positive for malaise/fatigue.  Respiratory: Positive for cough, shortness of breath and wheezing.   Cardiovascular: Negative for chest pain and claudication.  Gastrointestinal: Negative.   Genitourinary: Negative.   Skin: Negative.    Blood pressure (!) 174/104, pulse (!) 114, temperature 99.7 F (37.6 C), temperature source Axillary, resp. rate 17, height 5\' 2"  (1.575 m), weight 50.3 kg, SpO2 100 %. Physical Exam  Nursing note and vitals reviewed. Constitutional: He is oriented to person, place, and time. He appears well-developed and well-nourished.  Neck: Normal range of motion. Neck supple. No tracheal deviation present.  Cardiovascular: Normal rate and regular rhythm.  Respiratory: Effort normal and breath sounds normal. No respiratory distress. He has no wheezes.  GI: Soft. Bowel sounds are normal. He exhibits no distension. There is no abdominal  tenderness. There is no rebound.  Musculoskeletal: Normal range of motion.        General: No edema.  Neurological: He is alert and oriented to person, place, and time.  Skin: Skin is warm.    Assessment/Plan: Malfunctioning LEFT Brachial Axillary AV graft. Will  place a temporary HD catheter. Plan for Fistulogram, possible permacath on Monday.  Analys Ryden, [place] 02/28/2019, 8:19 PM

## 2019-03-01 LAB — CBC
HCT: 22.7 % — ABNORMAL LOW (ref 39.0–52.0)
Hemoglobin: 7.3 g/dL — ABNORMAL LOW (ref 13.0–17.0)
MCH: 32 pg (ref 26.0–34.0)
MCHC: 32.2 g/dL (ref 30.0–36.0)
MCV: 99.6 fL (ref 80.0–100.0)
Platelets: 132 10*3/uL — ABNORMAL LOW (ref 150–400)
RBC: 2.28 MIL/uL — ABNORMAL LOW (ref 4.22–5.81)
RDW: 15.5 % (ref 11.5–15.5)
WBC: 11.5 10*3/uL — ABNORMAL HIGH (ref 4.0–10.5)
nRBC: 0 % (ref 0.0–0.2)

## 2019-03-01 LAB — BASIC METABOLIC PANEL
Anion gap: 12 (ref 5–15)
BUN: 35 mg/dL — ABNORMAL HIGH (ref 6–20)
CO2: 28 mmol/L (ref 22–32)
Calcium: 8 mg/dL — ABNORMAL LOW (ref 8.9–10.3)
Chloride: 100 mmol/L (ref 98–111)
Creatinine, Ser: 5.2 mg/dL — ABNORMAL HIGH (ref 0.61–1.24)
GFR calc Af Amer: 14 mL/min — ABNORMAL LOW (ref >60)
GFR calc non Af Amer: 12 mL/min — ABNORMAL LOW (ref >60)
Glucose, Bld: 115 mg/dL — ABNORMAL HIGH (ref 70–99)
Potassium: 3.3 mmol/L — ABNORMAL LOW (ref 3.5–5.1)
Sodium: 140 mmol/L (ref 135–145)

## 2019-03-01 LAB — PROCALCITONIN: Procalcitonin: 3.57 ng/mL

## 2019-03-01 LAB — MAGNESIUM: Magnesium: 1.8 mg/dL (ref 1.7–2.4)

## 2019-03-01 LAB — LACTIC ACID, PLASMA
Lactic Acid, Venous: 1.6 mmol/L (ref 0.5–1.9)
Lactic Acid, Venous: 2 mmol/L (ref 0.5–1.9)

## 2019-03-01 LAB — PHOSPHORUS: Phosphorus: 3.8 mg/dL (ref 2.5–4.6)

## 2019-03-01 LAB — TROPONIN I: Troponin I: 0.07 ng/mL (ref <0.03)

## 2019-03-01 MED ORDER — HYDROXYZINE HCL 25 MG PO TABS: 25.0000 mg | ORAL_TABLET | Freq: Three times a day (TID) | ORAL | Status: DC | PRN

## 2019-03-01 MED ORDER — IPRATROPIUM-ALBUTEROL 0.5-2.5 (3) MG/3ML IN SOLN: 3.0000 mL | RESPIRATORY_TRACT | Status: DC | PRN

## 2019-03-01 MED ORDER — POTASSIUM CHLORIDE CRYS ER 20 MEQ PO TBCR
40.0000 meq | EXTENDED_RELEASE_TABLET | Freq: Once | ORAL | Status: AC
  Administered 2019-03-01: 13:00:00 40 meq via ORAL
  Filled 2019-03-01: qty 2

## 2019-03-01 MED ORDER — ONDANSETRON HCL 4 MG/2ML IJ SOLN
INTRAMUSCULAR | Status: AC
  Administered 2019-03-01: 4 mg via INTRAVENOUS
  Filled 2019-03-01: qty 2

## 2019-03-01 MED ORDER — LORATADINE 10 MG PO TABS
10.0000 mg | ORAL_TABLET | Freq: Every day | ORAL | Status: DC
  Administered 2019-03-01 – 2019-03-03 (×3): 10 mg via ORAL
  Filled 2019-03-01 (×3): qty 1

## 2019-03-01 MED ORDER — ONDANSETRON HCL 4 MG/2ML IJ SOLN
4.0000 mg | Freq: Four times a day (QID) | INTRAMUSCULAR | Status: DC | PRN
  Administered 2019-03-01: 4 mg via INTRAVENOUS

## 2019-03-01 NOTE — Progress Notes (Signed)
Patient requesting claritin and hydroxyzine (which he takes at home).  Order received from Dr Margaretmary Eddy for these meds

## 2019-03-01 NOTE — Progress Notes (Signed)
Pre HD Assessment    02/28/19 2319  Neurological  Level of Consciousness Alert  Orientation Level Oriented X4  Respiratory  Respiratory Pattern Regular;Labored  Chest Assessment Chest expansion symmetrical  Bilateral Breath Sounds Clear;Diminished  Cough None  Cardiac  Pulse Regular  Heart Sounds S1, S2  ECG Monitor Yes  Cardiac Rhythm ST  Vascular  R Radial Pulse +2  L Radial Pulse +2  Edema Generalized  Generalized Edema +1  Psychosocial  Psychosocial (WDL) WDL  Patient Behaviors Cooperative;Calm  Needs Expressed Physical  Emotional support given Given to patient

## 2019-03-01 NOTE — Progress Notes (Signed)
Notified Amanda Morrison, Dialysis liaison with patient pathways of admission.   

## 2019-03-01 NOTE — Progress Notes (Signed)
Lactic acid is 2.  Dr Margaretmary Eddy notified.  No new orders at this time

## 2019-03-01 NOTE — Progress Notes (Signed)
Post HD Assessment    03/01/19 0230  Neurological  Level of Consciousness Alert  Orientation Level Oriented X4  Respiratory  Respiratory Pattern Regular;Unlabored  Chest Assessment Chest expansion symmetrical  Bilateral Breath Sounds Clear;Diminished  Cough None  Cardiac  Pulse Regular  Heart Sounds S1, S2  ECG Monitor Yes  Cardiac Rhythm ST  Vascular  R Radial Pulse +2  L Radial Pulse +2  Edema Generalized  Generalized Edema +1  Psychosocial  Psychosocial (WDL) WDL  Patient Behaviors Cooperative;Calm  Needs Expressed Physical  Emotional support given Given to patient

## 2019-03-01 NOTE — Progress Notes (Signed)
HD TX started w/o complication    73/31/25 2330  Vital Signs  Pulse Rate (!) 122  Pulse Rate Source Monitor  Resp (!) 22  BP (!) 183/111  BP Location Right Arm  BP Method Automatic  Patient Position (if appropriate) Lying  Oxygen Therapy  SpO2 94 %  O2 Device Bi-PAP  Pulse Oximetry Type Continuous  During Hemodialysis Assessment  Blood Flow Rate (mL/min) 400 mL/min  Arterial Pressure (mmHg) -170 mmHg  Venous Pressure (mmHg) 140 mmHg  Transmembrane Pressure (mmHg) 70 mmHg  Ultrafiltration Rate (mL/min) 1000 mL/min  Dialysate Flow Rate (mL/min) 600 ml/min  Conductivity: Machine  13.9  HD Safety Checks Performed Yes  Dialysis Fluid Bolus Normal Saline  Bolus Amount (mL) 250 mL  Intra-Hemodialysis Comments Tx initiated

## 2019-03-01 NOTE — Progress Notes (Signed)
[place] [facility] [place] NAME: Brett Small    MR#:  [id number]  DATE OF BIRTH:  16-Oct-1976  SUBJECTIVE:  CHIEF COMPLAINT: Patient is on BiPAP.  On 2 L of oxygen and feeling better denies any shortness of breath or wheezing  REVIEW OF SYSTEMS:  CONSTITUTIONAL: No fever, fatigue or weakness.  EYES: No blurred or double vision.  EARS, NOSE, AND THROAT: No tinnitus or ear pain.  RESPIRATORY: Intermittent episodes of cough, denies shortness of breath while resting.  Denies wheezing or hemoptysis s CARDIOVASCULAR: No chest pain, orthopnea, edema.  GASTROINTESTINAL: No nausea, vomiting, diarrhea or abdominal pain.  GENITOURINARY: No dysuria, hematuria.  ENDOCRINE: No polyuria, nocturia,  HEMATOLOGY: No anemia, easy bruising or bleeding SKIN: No rash or lesion. MUSCULOSKELETAL: No joint pain or arthritis.   NEUROLOGIC: No tingling, numbness, weakness.  PSYCHIATRY: No anxiety or depression.   DRUG ALLERGIES:   Allergies  Allergen Reactions  . Codeine Nausea Only    Patient questioned this (??)  . Sulfa Antibiotics Hives and Nausea And Vomiting    VITALS:  Blood pressure (!) 133/93, pulse 99, temperature 98.9 F (37.2 C), temperature source Oral, resp. rate 17, height '5\' 2"'  (1.575 m), weight 49.1 kg, SpO2 97 %.  PHYSICAL EXAMINATION:  GENERAL:  43 y.o.-year-old patient lying in the bed with no acute distress.  EYES: Pupils equal, round, reactive to light and accommodation. No scleral icterus. Extraocular muscles intact.  HEENT: Head atraumatic, normocephalic. Oropharynx and nasopharynx clear.  NECK:  Supple, no jugular venous distention. No thyroid enlargement, no tenderness.  LUNGS: Moderate breath sounds bilaterally, no wheezing, rales,rhonchi or crepitation. No use of accessory muscles of respiration.  CARDIOVASCULAR: S1, S2 normal. No murmurs, rubs, or gallops.  ABDOMEN: Soft, nontender, nondistended. Bowel sounds present.   EXTREMITIES: No pedal edema, cyanosis, or clubbing.  NEUROLOGIC: Awake alert and oriented x3 sensation intact. Gait not checked.  PSYCHIATRIC: The patient is alert and oriented x 3.  SKIN: No obvious rash, lesion, or ulcer.    LABORATORY PANEL:   CBC Recent Labs  Lab 03/01/19 0445  WBC 11.5*  HGB 7.3*  HCT 22.7*  PLT 132*   ------------------------------------------------------------------------------------------------------------------  Chemistries  Recent Labs  Lab 02/28/19 1339 03/01/19 0445  NA 140 140  K 3.5 3.3*  CL 102 100  CO2 19* 28  GLUCOSE 182* 115*  BUN 54* 35*  CREATININE 7.60* 5.20*  CALCIUM 7.5* 8.0*  MG  --  1.8  AST 39  --   ALT 47*  --   ALKPHOS 96  --   BILITOT 0.7  --    ------------------------------------------------------------------------------------------------------------------  Cardiac Enzymes Recent Labs  Lab 03/01/19 0445  TROPONINI 0.07*   ------------------------------------------------------------------------------------------------------------------  RADIOLOGY:  Dg Chest Portable 1 View  Result Date: 02/28/2019 CLINICAL DATA:  Respiratory distress and shortness of breath. Patient with end-stage renal disease. EXAM: PORTABLE CHEST 1 VIEW COMPARISON:  02/21/2019 and prior chest radiographs FINDINGS: Bilateral interstitial/mild airspace opacities are again noted. New superimposed RIGHT LOWER lung airspace opacity is now identified. No pleural effusion or pneumothorax. No acute bony abnormalities. Mild cardiomegaly again noted. IMPRESSION: 1. New RIGHT LOWER lung airspace opacity superimposed on unchanged bilateral interstitial/mild airspace opacities (likely edema), which may represent new focal edema versus pneumonia. 2. Mild cardiomegaly Electronically Signed   By: Margarette Canada M.D.   On: 02/28/2019 14:26    EKG:   Orders placed or performed during the hospital encounter of 02/28/19  . ED EKG  .  ED EKG    ASSESSMENT AND  PLAN:   *Acute respiratory failure Secondary to healthcare associated pneumonia and missing hemodialysis Clinically improving.   Off BiPAP.  Currently on 2 L of oxygen via nasal cannula Continue broad-spectrum antibiotic. Check MRSA. COVID-19 test is negative. Further management as per intensivist.  *Sepsis secondary to Healthcare associated pneumonia Patient met septic criteria at the time of admission with tachycardia and elevated lactic acid Continue broad-spectrum antibiotics Monitor lactic acid and procalcitonin which are elevated at this time  *Hypokalemia replete and recheck  *End-stage renal disease on hemodialysis Patient missed hemodialysis 02/28/2019 Nephrology is following Vascular consulted for temporary hemodialysis catheter placement and for urgent hemodialysis as AV graft in the left brachial area is malfunctioning Possible fistulogram and permacath placement on Monday  *Arterial thrombosis He has history of aortic aneurysm surgery with bypass graft, and had a clot in there and since then he is on blood thinner. I will continue Eliquis while here.  *Hypertension Continue home meds.    #Tobacco abuse disorder counseled patient to quit smoking  All the records are reviewed and case discussed with Care Management/Social Workerr. Management plans discussed with the patient, HE is  in agreement.  CODE STATUS: FC   TOTAL TIME TAKING CARE OF THIS PATIENT: 35  minutes.   POSSIBLE D/C IN 2-3  DAYS, DEPENDING ON CLINICAL CONDITION.  Note: This dictation was prepared with Dragon dictation along with smaller phrase technology. Any transcriptional errors that result from this process are unintentional.   Nicholes Mango M.D on 03/01/2019 at 12:22 PM  Between 7am to 6pm - Pager - [phone] After 6pm go to [email or web address] - password [email or web address] [facility]  Office  [phone]  CC: Primary care physician; Patient, [name]

## 2019-03-01 NOTE — Progress Notes (Signed)
Patient arrived from ICU. Alert and oriented. Watching TV

## 2019-03-01 NOTE — Consult Note (Signed)
PULMONARY / CRITICAL CARE MEDICINE  Name: Brett Small MRN: [id number] DOB: 1976-10-22    LOS: 1  Referring Provider: Dr. Anselm Jungling Reason for Referral: Acute pulmonary edema and acute respiratory failure   HPI: 43 year old male with end-stage renal disease on hemodialysis who presented with acute dyspnea.  He missed dialysis due to inability to access fistula.  At the ED he was noted to be hypoxic with a mildly elevated WBC and lactic acid.  He denies fever and chills.  His COVID screen was negative.  His chest x-ray shows diffuse pulmonary edema.  Right femoral hemodialysis catheter has been placed.  He is due for hemodialysis tonight.  Past Medical History:  Diagnosis Date  . Hypertension   . Renal disorder    Past Surgical History:  Procedure Laterality Date  . A/V SHUNT INTERVENTION Left 01/19/2019   Procedure: LEFT UPPER EXTREMITY A/V SHUNTOGRAM / UPPER EXTREMITY ANGIOGRAM;  Surgeon: Algernon Huxley, MD;  Location: [facility];  Service: Cardiovascular;  Laterality: Left;  . AORTA - FEMORAL ARTERY BYPASS GRAFT    . AV FISTULA PLACEMENT Left 12/26/2018   Procedure: INSERTION OF GORE STRETCH VASCULAR 4-7MM X  45CM IN LEFT UPPER ARM;  Surgeon: Marty Heck, MD;  Location: [facility];  Service: Vascular;  Laterality: Left;  . DIALYSIS/PERMA CATHETER REMOVAL N/A 02/06/2019   Procedure: DIALYSIS/PERMA CATHETER REMOVAL;  Surgeon: Algernon Huxley, MD;  Location: [facility];  Service: Cardiovascular;  Laterality: N/A;   No current facility-administered medications on file prior to encounter.    Current Outpatient Medications on File Prior to Encounter  Medication Sig  . amLODipine (NORVASC) 10 MG tablet Take 1 tablet (10 mg total) by mouth daily at 8 pm.  . apixaban (ELIQUIS) 2.5 MG TABS tablet Take 1 tablet (2.5 mg total) by mouth 2 (two) times daily.  Marland Kitchen aspirin EC 81 MG tablet Take 1 tablet (81 mg total) by mouth daily.  Marland Kitchen atorvastatin (LIPITOR) 80 MG tablet Take 1  tablet (80 mg total) by mouth at bedtime.  . carvedilol (COREG) 25 MG tablet Take 1 tablet (25 mg total) by mouth 2 (two) times daily with a meal.  . doxycycline (VIBRAMYCIN) 100 MG capsule Take 1 capsule (100 mg total) by mouth 2 (two) times daily for 7 days.  . furosemide (LASIX) 80 MG tablet Take 1 tablet (80 mg total) by mouth every Monday,Wednesday,Friday, and Sunday at 6 PM. Non dialysis days  . gabapentin (NEURONTIN) 100 MG capsule Take 1 capsule (100 mg total) by mouth 3 (three) times daily.  . hydrALAZINE (APRESOLINE) 25 MG tablet Take 1 tablet (25 mg total) by mouth every 8 (eight) hours.  . hydrOXYzine (ATARAX/VISTARIL) 25 MG tablet Take 25 mg by mouth 3 (three) times daily as needed for anxiety.  . multivitamin (RENA-VIT) TABS tablet Take 1 tablet by mouth daily.  Marland Kitchen neomycin-bacitracin-polymyxin (NEOSPORIN) OINT Apply 1 application topically 2 (two) times daily.  . sevelamer carbonate (RENVELA) 800 MG tablet Take 2 tablets (1,600 mg total) by mouth 3 (three) times daily with meals.    Allergies Allergies  Allergen Reactions  . Codeine Nausea Only    Patient questioned this (??)  . Sulfa Antibiotics Hives and Nausea And Vomiting    Family History Family History  Problem Relation Age of Onset  . Hypertension Other   . Diabetes Other   . Clotting disorder Father    Social History  reports that he has been smoking. He has never used smokeless  tobacco. He reports previous alcohol use. He reports that he does not use drugs.  Review Of Systems:   Constitutional: Negative for fever and chills.  HENT: Negative for congestion and rhinorrhea.  Eyes: Negative for redness and visual disturbance.  Respiratory: Positive for for shortness of breath but negative for wheezing.  Cardiovascular: Negative for chest pain and palpitations.  Gastrointestinal: Negative  for nausea , vomiting and abdominal pain and  Loose stools Genitourinary: Negative for dysuria and urgency.  Endocrine:  Denies polyuria, polyphagia and heat intolerance Musculoskeletal: Negative for myalgias and arthralgias.  Skin: Negative for pallor and wound.  Neurological: Negative for dizziness and headaches   VITAL SIGNS: BP 121/70   Pulse 96   Temp 97.8 F (36.6 C) (Axillary)   Resp 19   Ht 5\' 2"  (1.575 m)   Wt 49.1 kg   SpO2 100%   BMI 19.80 kg/m   HEMODYNAMICS:    VENTILATOR SETTINGS:    INTAKE / OUTPUT: I/O last 3 completed shifts: In: 378.9 [I.V.:33.8; IV Piggyback:345.1] Out: 1593 [Other:1593]  PHYSICAL EXAMINATION: General: Awake, pleasant, in moderate respiratory distress HEENT: PERRLA, trachea midline, moderate JVD Neuro: Alert and oriented x4, no focal deficits Cardiovascular: Apical pulse regular, S1-S2, no murmur regurg or gallop, +2 pulses bilaterally Lungs: Bilateral breath sounds diminished in all lung fields with  crackles in the bases Abdomen: Nondistended, normal bowel sounds in all 4 quadrants Musculoskeletal: Positive range of motion, no deformities Skin: Warm and dry  LABS:  BMET Recent Labs  Lab 02/24/19 0531 02/28/19 1339 03/01/19 0445  NA 140 140 140  K 4.3 3.5 3.3*  CL 98 102 100  CO2 29 19* 28  BUN 36* 54* 35*  CREATININE 4.64* 7.60* 5.20*  GLUCOSE 92 182* 115*    Electrolytes Recent Labs  Lab 02/23/19 1018 02/24/19 0531 02/28/19 1339 03/01/19 0445  CALCIUM  --  8.6* 7.5* 8.0*  MG  --   --   --  1.8  PHOS 3.2  --   --  3.8    CBC Recent Labs  Lab 02/28/19 1339 03/01/19 0445  WBC 17.2* 11.5*  HGB 9.2* 7.3*  HCT 30.1* 22.7*  PLT 267 132*    Coag's No results for input(s): APTT, INR in the last 168 hours.  Sepsis Markers Recent Labs  Lab 02/23/19 0401 02/28/19 1338 02/28/19 1814  LATICACIDVEN  --  6.5* 2.3*  PROCALCITON 0.62  --   --     ABG No results for input(s): PHART, PCO2ART, PO2ART in the last 168 hours.  Liver Enzymes Recent Labs  Lab 02/28/19 1339  AST 39  ALT 47*  ALKPHOS 96  BILITOT 0.7   ALBUMIN 3.6    Cardiac Enzymes Recent Labs  Lab 02/23/19 0401 02/28/19 1339 03/01/19 0445  TROPONINI <0.03 0.07* 0.07*    Glucose Recent Labs  Lab 02/28/19 1755  GLUCAP 118*    Imaging Dg Chest Portable 1 View  Result Date: 02/28/2019 CLINICAL DATA:  Respiratory distress and shortness of breath. Patient with end-stage renal disease. EXAM: PORTABLE CHEST 1 VIEW COMPARISON:  02/21/2019 and prior chest radiographs FINDINGS: Bilateral interstitial/mild airspace opacities are again noted. New superimposed RIGHT LOWER lung airspace opacity is now identified. No pleural effusion or pneumothorax. No acute bony abnormalities. Mild cardiomegaly again noted. IMPRESSION: 1. New RIGHT LOWER lung airspace opacity superimposed on unchanged bilateral interstitial/mild airspace opacities (likely edema), which may represent new focal edema versus pneumonia. 2. Mild cardiomegaly Electronically Signed   By: Margarette Canada  M.D.   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 Tobacco use disorder  PLAN Continues BiPAP and titrate to nasal cannula as tolerated Nebulized bronchodilators Emergent hemodialysis per nephrology Monitor and correct electrolytes Continue all home medications DC antibiotics Trend procalcitonin Best Practice: Code Status: Full code Diet: Renal diet GI prophylaxis: Not indicated VTE prophylaxis: Already on full-strength anticoagulation  FAMILY  - Updates: Patient updated on current treatment plan  COVID-19 DISASTER DECLARATION:   FULL CONTACT PHYSICAL EXAMINATION WAS NOT POSSIBLE DUE TO TREATMENT OF COVID-19  AND CONSERVATION OF PERSONAL PROTECTIVE EQUIPMENT, LIMITED EXAM FINDINGS INCLUDE-    Patient assessed or the symptoms described in the history of present illness.  In the context of the Global COVID-19 pandemic,  which necessitated consideration that the patient might be at risk for infection with the SARS-CoV-2 virus that causes COVID-19, Institutional protocols and algorithms that pertain to the evaluation of patients at risk for COVID-19 are in a state of rapid change based on information released by regulatory bodies including the CDC and federal and state organizations. These policies and algorithms were followed during the patient's care while in hospital.  Kazden Largo S. Tukov-Yual ANP-BC Pulmonary and [place] Pager [phone] or [phone]  NB: This document was prepared using Dragon voice recognition software and may include unintentional dictation errors.    03/01/2019, 10:20 AM

## 2019-03-01 NOTE — Progress Notes (Signed)
Post HD Tx    03/01/19 0230  Hand-Off documentation  Report given to (Full Name) Lucky Rathke, RN   Report received from (Full Name) Huston Foley   Vital Signs  Temp 99 F (37.2 C)  Temp Source Oral  Pulse Rate (!) 109  Pulse Rate Source Monitor  Resp (!) 28  BP (!) 143/89  BP Location Right Arm  BP Method Automatic  Patient Position (if appropriate) Lying  Oxygen Therapy  SpO2 100 %  O2 Device Bi-PAP  Pulse Oximetry Type Continuous  Pain Assessment  Pain Scale 0-10  Pain Score 0  Dialysis Weight  Weight 49.1 kg  Type of Weight Post-Dialysis  Post-Hemodialysis Assessment  Rinseback Volume (mL) 250 mL  KECN 54.9 V  Dialyzer Clearance Lightly streaked  Duration of HD Treatment -hour(s) 2.5 hour(s) (Sysytem clotted r/t newly placed temp cath dropping pressure)  Hemodialysis Intake (mL) 500 mL  UF Total -Machine (mL) 2093 mL  Net UF (mL) 1593 mL  Tolerated HD Treatment Yes  Post-Hemodialysis Comments Tx ended early r/t clotted system.  Hemodialysis Catheter Right Femoral vein Triple-lumen  Placement Date/Time: 02/28/19 1930   Placed prior to admission: No  Time Out: Correct patient;Correct site;Correct procedure  Maximum sterile barrier precautions: Hand hygiene;Cap;Mask;Sterile gown;Sterile gloves;Large sterile sheet  Ultrasound Used?:...  Site Condition No complications  Blue Lumen Status Heparin locked  Red Lumen Status Heparin locked  Purple Lumen Status Capped (Central line)  Post treatment catheter status Capped and Clamped

## 2019-03-01 NOTE — Progress Notes (Signed)
Pre HD [facility]   02/28/19 2328  Hand-Off documentation  Report given to (Full Name) Beatris Ship, RN   Report received from (Full Name) Nyoka Cowden, RN   Vital Signs  Temp 99.2 F (37.3 C)  Temp Source Oral  Pulse Rate (!) 118  Pulse Rate Source Monitor  Resp 18  BP (!) 155/92  BP Location Right Arm  BP Method Automatic  Patient Position (if appropriate) Lying  Oxygen Therapy  SpO2 100 %  O2 Device Bi-PAP  Pulse Oximetry Type Continuous  Pain Assessment  Pain Scale 0-10  Pain Score 0  Dialysis Weight  Weight 50.3 kg  Type of Weight Pre-Dialysis  Time-Out for Hemodialysis  What Procedure? HD  Pt Identifiers(min of two) First/Last Name;MRN/Account#  Correct Site? Yes  Correct Side? Yes  Correct Procedure? Yes  Consents Verified? Yes  Rad Studies Available? N/A  Safety Precautions Reviewed? Yes  Engineer, civil (consulting) Number 5  Station Number  (ICU 19)  UF/Alarm Test Passed  Conductivity: Meter 14  Conductivity: Machine  14.1  pH 7.2  Reverse Osmosis WRO3  Normal Saline Lot Number [id number]  Dialyzer Lot Number [id number]  Disposable Set Lot Number [id number]  Machine Temperature 98.6 F (37 C)  Musician and Audible Yes  Blood Lines Intact and Secured Yes  Pre Treatment Patient Checks  Vascular access used during treatment Catheter  Hepatitis B Surface Antigen Results Negative  Date Hepatitis B Surface Antigen Drawn 02/23/19  Hepatitis B Surface Antibody  (<10)  Date Hepatitis B Surface Antibody Drawn 02/23/19  Hemodialysis Consent Verified Yes  Hemodialysis Standing Orders Initiated Yes  ECG (Telemetry) Monitor On Yes  Prime Ordered Normal Saline  Length of  DialysisTreatment -hour(s) 3 Hour(s)  Dialysis Treatment Comments Na 140  Dialyzer Elisio 17H NR  Dialysate 3K, 2.5 Ca  Dialysis Anticoagulant None  Dialysate Flow Ordered 600  Blood Flow Rate Ordered 400 mL/min  Ultrafiltration Goal 2.5 Liters  Dialysis Blood Pressure Support Ordered Normal  Saline  Education / Care Plan  Dialysis Education Provided Yes  Documented Education in Care Plan Yes  Fistula / Graft Left Upper arm Arteriovenous vein graft  Placement Date/Time: 12/26/18 1024   Placed prior to admission: No  Orientation: Left  Access Location: Upper arm  Access Type: (c) Arteriovenous vein graft  Site Condition Other (Comment) (clotted)  Fistula / Graft Assessment Absent ;Bruit  Status Other (Comment) (malfunction)  Hemodialysis Catheter Right Femoral vein Triple-lumen  Placement Date/Time: 02/28/19 1930   Placed prior to admission: No  Time Out: Correct patient;Correct site;Correct procedure  Maximum sterile barrier precautions: Hand hygiene;Cap;Mask;Sterile gown;Sterile gloves;Large sterile sheet  Ultrasound Used?:...  Site Condition No complications  Blue Lumen Status Blood return noted  Red Lumen Status Blood return noted  Purple Lumen Status Capped (Central line)  Dressing Type Gauze/Drain sponge  Dressing Status Clean;Dry;Intact  Drainage Description None  Dressing Change Due 03/02/19

## 2019-03-01 NOTE — Progress Notes (Signed)
[facility]  ROUNDING NOTE   Subjective:   Emergent hemodialysis treatment last night due to pulmonary edema/acute respiratory failure requiring noninvasive ventilation with BIPAP. AVF was not functioning so emergent temp HD catheter was placed by vasculary surgery last night, Dr. Lorenso Courier.   Missed dialysis as outpatient yesterday morning due to AVF not being able to be accessed.   Patient tolerated hemodialysis treatment well. UF  Of 158mL. Denies any shortness of breath this morning.   Objective:  Vital signs in last 24 hours:  Temp:  [97.8 F (36.6 C)-99.8 F (37.7 C)] 98.9 F (37.2 C) (05/10 1215) Pulse Rate:  [95-125] 99 (05/10 1215) Resp:  [14-28] 17 (05/10 1215) BP: (110-195)/(67-115) 133/93 (05/10 1215) SpO2:  [94 %-100 %] 97 % (05/10 1215) Weight:  [49.1 kg-50.3 kg] 49.1 kg (05/10 0230)  Weight change:  Filed Weights   02/28/19 1754 02/28/19 2328 03/01/19 0230  Weight: 50.3 kg 50.3 kg 49.1 kg    Intake/Output: I/O last 3 completed shifts: In: 378.9 [I.V.:33.8; IV Piggyback:345.1] Out: 1593 [Other:1593]   Intake/Output this shift:  Total I/O In: -  Out: 200 [Urine:200]  Physical Exam: General:  No acute distress  Head: Normocephalic, atraumatic.  Eyes: Anicteric  Neck: Supple  Lungs:  clear  Heart: Regular, no rubs  Abdomen:  Soft, nontender, bowel sounds present  Extremities: no peripheral edema.  Neurologic: Awake, alert, follows commands  Skin: No lesions  Access: left arm AVG  No bruit or thrill, Right femoral temp HD catheter 5/9    Basic Metabolic Panel: Recent Labs  Lab 02/23/19 0401 02/23/19 1018 02/24/19 0531 02/28/19 1339 03/01/19 0445  NA 139  --  140 140 140  K 6.4*  --  4.3 3.5 3.3*  CL 105  --  98 102 100  CO2 22  --  29 19* 28  GLUCOSE 116*  --  92 182* 115*  BUN 47*  --  36* 54* 35*  CREATININE 6.28*  --  4.64* 7.60* 5.20*  CALCIUM 8.6*  --  8.6* 7.5* 8.0*  MG  --   --   --   --  1.8  PHOS  --  3.2  --   --  3.8     Liver Function Tests: Recent Labs  Lab 02/28/19 1339  AST 39  ALT 47*  ALKPHOS 96  BILITOT 0.7  PROT 7.6  ALBUMIN 3.6   No results for input(s): LIPASE, AMYLASE in the last 168 hours. No results for input(s): AMMONIA in the last 168 hours.  CBC: Recent Labs  Lab 02/28/19 1339 03/01/19 0445  WBC 17.2* 11.5*  NEUTROABS 14.6*  --   HGB 9.2* 7.3*  HCT 30.1* 22.7*  MCV 102.4* 99.6  PLT 267 132*    Cardiac Enzymes: Recent Labs  Lab 02/22/19 1605 02/22/19 2151 02/23/19 0401 02/28/19 1339 03/01/19 0445  TROPONINI <0.03 <0.03 <0.03 0.07* 0.07*    BNP: Invalid input(s): POCBNP  CBG: Recent Labs  Lab 02/28/19 1755  GLUCAP 118*    Microbiology: Results for orders placed or performed during the hospital encounter of 02/28/19  Culture, blood (routine x 2)     Status: None (Preliminary result)   Collection Time: 02/28/19  1:38 PM  Result Value Ref Range Status   Specimen Description BLOOD RIGHT ANTECUBITAL  Final   Special Requests   Final    BOTTLES DRAWN AEROBIC AND ANAEROBIC Blood Culture results may not be optimal due to an excessive volume of blood received in culture  bottles   Culture   Final    NO GROWTH < 24 HOURS Performed at [name], [address] [place], [place] [address]    Report Status PENDING  Incomplete  Culture, blood (routine x 2)     Status: None (Preliminary result)   Collection Time: 02/28/19  2:05 PM  Result Value Ref Range Status   Specimen Description BLOOD LEFT ANTECUBITAL  Final   Special Requests   Final    BOTTLES DRAWN AEROBIC AND ANAEROBIC Blood Culture results may not be optimal due to an inadequate volume of blood received in culture bottles   Culture   Final    NO GROWTH < 24 HOURS Performed at [facility], [address], [place], [place] [address]    Report Status PENDING  Incomplete  SARS Coronavirus 2 (CEPHEID - Performed in [facility] hospital lab), Hosp Order     Status: None    Collection Time: 02/28/19  2:05 PM  Result Value Ref Range Status   SARS Coronavirus 2 NEGATIVE NEGATIVE Final    Comment: (NOTE) If result is NEGATIVE SARS-CoV-2 target nucleic acids are NOT DETECTED. The SARS-CoV-2 RNA is generally detectable in upper and lower  respiratory specimens during the acute phase of infection. The lowest  concentration of SARS-CoV-2 viral copies this assay can detect is 250  copies / mL. A negative result does not preclude SARS-CoV-2 infection  and should not be used as the sole basis for treatment or other  patient management decisions.  A negative result may occur with  improper specimen collection / handling, submission of specimen other  than nasopharyngeal swab, presence of viral mutation(s) within the  areas targeted by this assay, and inadequate number of viral copies  (<250 copies / mL). A negative result must be combined with clinical  observations, patient history, and epidemiological information. If result is POSITIVE SARS-CoV-2 target nucleic acids are DETECTED. The SARS-CoV-2 RNA is generally detectable in upper and lower  respiratory specimens dur ing the acute phase of infection.  Positive  results are indicative of active infection with SARS-CoV-2.  Clinical  correlation with patient history and other diagnostic information is  necessary to determine patient infection status.  Positive results do  not rule out bacterial infection or co-infection with other viruses. If result is PRESUMPTIVE POSTIVE SARS-CoV-2 nucleic acids MAY BE PRESENT.   A presumptive positive result was obtained on the submitted specimen  and confirmed on repeat testing.  While 2019 novel coronavirus  (SARS-CoV-2) nucleic acids may be present in the submitted sample  additional confirmatory testing may be necessary for epidemiological  and / or clinical management purposes  to differentiate between  SARS-CoV-2 and other Sarbecovirus currently known to infect humans.   If clinically indicated additional testing with an alternate test  methodology [phone]) is advised. The SARS-CoV-2 RNA is generally  detectable in upper and lower respiratory sp ecimens during the acute  phase of infection. The expected result is Negative. Fact Sheet for Patients:  StrictlyIdeas.no Fact Sheet for Healthcare Providers: BankingDealers.co.za This test is not yet approved or cleared by the [place] FDA and has been authorized for detection and/or diagnosis of SARS-CoV-2 by FDA under an Emergency Use Authorization (EUA).  This EUA will remain in effect (meaning this test can be used) for the duration of the COVID-19 declaration under Section 564(b)(1) of the Act, 21 U.S.C. section 360bbb-3(b)(1), unless the authorization is terminated or revoked sooner. Performed at [facility], [address] [place], [place] [address]     Coagulation Studies: No results for input(s): LABPROT, INR in the last 72 hours.  Urinalysis: No results for input(s): COLORURINE, LABSPEC, PHURINE, GLUCOSEU, HGBUR, BILIRUBINUR, KETONESUR, PROTEINUR, UROBILINOGEN, NITRITE, LEUKOCYTESUR in the last 72 hours.  Invalid input(s): APPERANCEUR    Imaging: Dg Chest Portable 1 View  Result Date: 02/28/2019 CLINICAL DATA:  Respiratory distress and shortness of breath. Patient with end-stage renal disease. EXAM: PORTABLE CHEST 1 VIEW COMPARISON:  02/21/2019 and prior chest radiographs FINDINGS: Bilateral interstitial/mild airspace opacities are again noted. New superimposed RIGHT LOWER lung airspace opacity is now identified. No pleural effusion or pneumothorax. No acute bony abnormalities. Mild cardiomegaly again noted. IMPRESSION: 1. New RIGHT LOWER lung airspace opacity superimposed on unchanged bilateral interstitial/mild airspace opacities (likely edema), which may represent new focal edema versus pneumonia. 2. Mild cardiomegaly  Electronically Signed   By: Margarette Canada M.D.   On: 02/28/2019 14:26     Medications:    . amLODipine  10 mg Oral Q2000  . apixaban  2.5 mg Oral BID  . aspirin EC  81 mg Oral Daily  . atorvastatin  80 mg Oral QHS  . chlorhexidine  15 mL Mouth Rinse BID  . Chlorhexidine Gluconate Cloth  6 each Topical Q0600  . furosemide  80 mg Oral Q M,W,F,Su-1800  . gabapentin  100 mg Oral TID  . hydrALAZINE  25 mg Oral Q8H  . mouth rinse  15 mL Mouth Rinse q12n4p  . multivitamin  1 tablet Oral Daily  . sevelamer carbonate  1,600 mg Oral TID WC     Assessment/ Plan:  [name] is a 43 y.o. white male with end stage renal disease on hemodialysis, hypertension, depression, aortobifemoral bypass admitted to [facility] on 02/28/2019 for acute respiratory failure requiring BIPAP and complication of dialysis access.   [place] TTS left AVG 46.5kg    1.  End stage renal disease on hemodialysis : emergent hemodialysis treatment last night through temp HD catheter.  Complication with AVF with no bruit or thrill - Vascular surgery scheduled for angiogram and possible thrombectomy tomorrow.   2.  Anemia of chronic kidney disease:   - EPO with TTS HD treatments.   4.  Secondary hyperparathyroidism. Phosphorus and calcium at goal. Labs from 4/23 PTH 342 - at goal.  - Continue sevelamer 1600 mg p.o. 3 times daily.  5.  Hypertension: Home regimen of amlodipine, hydralazine and carvedilol. Start furosemide on discharge.    LOS: 1 Ferd Horrigan 5/10/20202:01 PM

## 2019-03-01 NOTE — Progress Notes (Signed)
HD tx completed, ended early because of clotting in venous chamber from repeated alarms when temp cath art pressure bottomed out. UF goal not met r/t shortend treatment, MD made aware. Pt reports easier breathing post tx and denies c/o.    03/01/19 0215  Vital Signs  Pulse Rate (!) 108  Pulse Rate Source Monitor  Resp (!) 25  BP (!) 147/86  BP Location Right Arm  BP Method Automatic  Patient Position (if appropriate) Lying  Oxygen Therapy  SpO2 100 %  Pulse Oximetry Type Continuous  During Hemodialysis Assessment  HD Safety Checks Performed Yes  KECN 54.9 KECN  Dialysis Fluid Bolus Normal Saline  Bolus Amount (mL) 250 mL  Intra-Hemodialysis Comments Tx completed;See progress note

## 2019-03-02 ENCOUNTER — Other Ambulatory Visit (INDEPENDENT_AMBULATORY_CARE_PROVIDER_SITE_OTHER): Payer: Self-pay | Admitting: Vascular Surgery

## 2019-03-02 ENCOUNTER — Encounter
Admission: EM | Disposition: A | Discharge status: Home / Self Care | Payer: Self-pay | Source: Home / Self Care | Attending: Internal Medicine

## 2019-03-02 ENCOUNTER — Inpatient Hospital Stay: Payer: Medicare Other

## 2019-03-02 DIAGNOSIS — N186 End stage renal disease: Secondary | ICD-10-CM

## 2019-03-02 DIAGNOSIS — Z992 Dependence on renal dialysis: Secondary | ICD-10-CM

## 2019-03-02 DIAGNOSIS — T82868A Thrombosis of vascular prosthetic devices, implants and grafts, initial encounter: Secondary | ICD-10-CM

## 2019-03-02 HISTORY — PX: A/V SHUNT INTERVENTION: CATH118220

## 2019-03-02 LAB — PROCALCITONIN: Procalcitonin: 4 ng/mL

## 2019-03-02 SURGERY — A/V SHUNT INTERVENTION
Anesthesia: Moderate Sedation | Laterality: Left

## 2019-03-02 MED ORDER — CEFAZOLIN SODIUM-DEXTROSE 1-4 GM/50ML-% IV SOLN: 1.0000 g | Freq: Once | INTRAVENOUS | Status: DC

## 2019-03-02 MED ORDER — TRAMADOL HCL 50 MG PO TABS: 50.0000 mg | ORAL_TABLET | Freq: Four times a day (QID) | ORAL | Status: DC

## 2019-03-02 MED ORDER — MIDAZOLAM HCL 5 MG/5ML IJ SOLN
INTRAMUSCULAR | Status: AC
  Filled 2019-03-02: qty 5

## 2019-03-02 MED ORDER — HEPARIN SODIUM (PORCINE) 1000 UNIT/ML IJ SOLN
INTRAMUSCULAR | Status: AC
  Filled 2019-03-02: qty 1

## 2019-03-02 MED ORDER — TRAMADOL HCL 50 MG PO TABS
50.0000 mg | ORAL_TABLET | Freq: Four times a day (QID) | ORAL | Status: DC | PRN
  Administered 2019-03-02 – 2019-03-03 (×2): 50 mg via ORAL
  Filled 2019-03-02 (×2): qty 1

## 2019-03-02 MED ORDER — FENTANYL CITRATE (PF) 100 MCG/2ML IJ SOLN
INTRAMUSCULAR | Status: AC
  Filled 2019-03-02: qty 2

## 2019-03-02 MED ORDER — ALTEPLASE 2 MG IJ SOLR
INTRAMUSCULAR | Status: DC | PRN
  Administered 2019-03-02: 4 mg

## 2019-03-02 MED ORDER — HYDROMORPHONE HCL 1 MG/ML IJ SOLN: 1.0000 mg | Freq: Once | INTRAMUSCULAR | Status: DC | PRN

## 2019-03-02 MED ORDER — MIDAZOLAM HCL 2 MG/ML PO SYRP: 8.0000 mg | ORAL_SOLUTION | Freq: Once | ORAL | Status: DC | PRN

## 2019-03-02 MED ORDER — DIPHENHYDRAMINE HCL 50 MG/ML IJ SOLN: 50.0000 mg | Freq: Once | INTRAMUSCULAR | Status: DC | PRN

## 2019-03-02 MED ORDER — ONDANSETRON HCL 4 MG/2ML IJ SOLN: 4.0000 mg | Freq: Four times a day (QID) | INTRAMUSCULAR | Status: DC | PRN

## 2019-03-02 MED ORDER — FENTANYL CITRATE (PF) 100 MCG/2ML IJ SOLN
INTRAMUSCULAR | Status: DC | PRN
  Administered 2019-03-02 (×2): 50 ug via INTRAVENOUS

## 2019-03-02 MED ORDER — SODIUM CHLORIDE 0.9 % IV SOLN
INTRAVENOUS | Status: DC
  Administered 2019-03-02: 14:00:00 via INTRAVENOUS

## 2019-03-02 MED ORDER — MIDAZOLAM HCL 2 MG/2ML IJ SOLN
INTRAMUSCULAR | Status: DC | PRN
  Administered 2019-03-02 (×3): 1 mg via INTRAVENOUS
  Administered 2019-03-02: 2 mg via INTRAVENOUS

## 2019-03-02 MED ORDER — METHYLPREDNISOLONE SODIUM SUCC 125 MG IJ SOLR: 125.0000 mg | Freq: Once | INTRAMUSCULAR | Status: DC | PRN

## 2019-03-02 MED ORDER — FAMOTIDINE 20 MG PO TABS: 40.0000 mg | ORAL_TABLET | Freq: Once | ORAL | Status: DC | PRN

## 2019-03-02 MED ORDER — CEFDINIR 300 MG PO CAPS
300.0000 mg | ORAL_CAPSULE | Freq: Once | ORAL | Status: AC
  Administered 2019-03-02: 300 mg via ORAL
  Filled 2019-03-02: qty 1

## 2019-03-02 MED ORDER — HEPARIN SODIUM (PORCINE) 1000 UNIT/ML IJ SOLN
INTRAMUSCULAR | Status: DC | PRN
  Administered 2019-03-02: 3000 [IU] via INTRAVENOUS

## 2019-03-02 MED ORDER — LIDOCAINE-EPINEPHRINE (PF) 1 %-1:200000 IJ SOLN
INTRAMUSCULAR | Status: AC
  Filled 2019-03-02: qty 30

## 2019-03-02 SURGICAL SUPPLY — 22 items
BALLN DORADO 7X150X80 (BALLOONS) ×3
BALLN DORADO 8X60X80 (BALLOONS) ×3
BALLN ULTRVRSE 7X100X75 (BALLOONS) ×3
BALLOON DORADO 7X150X80 (BALLOONS) ×1 IMPLANT
BALLOON DORADO 8X60X80 (BALLOONS) ×1 IMPLANT
BALLOON ULTRVRSE 7X100X75 (BALLOONS) ×1 IMPLANT
CANNULA 5F STIFF (CANNULA) ×3 IMPLANT
CATH BEACON 5 .035 40 KMP TP (CATHETERS) ×1 IMPLANT
CATH BEACON 5 .038 40 KMP TP (CATHETERS) ×2
CATH EMBOLECTOMY 5FR (BALLOONS) ×3 IMPLANT
DEVICE PRESTO INFLATION (MISCELLANEOUS) ×3 IMPLANT
PACK ANGIOGRAPHY (CUSTOM PROCEDURE TRAY) ×3 IMPLANT
SET AVX THROMB ULT (MISCELLANEOUS) ×3 IMPLANT
SHEATH BRITE TIP 6FRX5.5 (SHEATH) ×6 IMPLANT
SHEATH BRITE TIP 7FRX5.5 (SHEATH) ×3 IMPLANT
STENT VIABAHN 8X150X120 (Permanent Stent) ×2 IMPLANT
STENT VIABAHN 8X15X120 7FR (Permanent Stent) ×1 IMPLANT
STENT VIABAHN 8X2.5X120 (Permanent Stent) ×1 IMPLANT
STENT VIABAHN 8X25X120 (Permanent Stent) ×2 IMPLANT
SUT MNCRL AB 4-0 PS2 18 (SUTURE) ×3 IMPLANT
WIRE G 018X200 V18 (WIRE) ×3 IMPLANT
WIRE MAGIC TOR.035 180C (WIRE) ×6 IMPLANT

## 2019-03-02 NOTE — H&P (Signed)
[facility] History & Physical Update  The patient was interviewed and re-examined.  The patient's previous History and Physical has been reviewed and is unchanged.  There is no change in the plan of care. We plan to proceed with the scheduled procedure.  Leotis Pain, MD  03/02/2019, 2:25 PM

## 2019-03-02 NOTE — Op Note (Signed)
[facility]    OPERATIVE NOTE   PROCEDURE: 1.  Left brachial artery to axillary vein arteriovenous graft cannulation under ultrasound guidance in both a retrograde and then antegrade fashion crossing 2.  Left arm shuntogram and central venogram 3.  Catheter directed thrombolysis with 4 mg of TPA delivered with the AngioJet AVX catheter 4.  Mechanical rheolytic thrombectomy to the left brachial artery to axillary vein with the AngioJet AVX catheter 5.  Fogarty embolectomy for residual arterial plug 6.  Percutaneous transluminal angioplasty of the mid and distal graft with 7 mm diameter angioplasty balloon 7.  Percutaneous transluminal angioplasty of the axillary vein and subclavian vein just proximal to the previously placed stents with 7 mm diameter angioplasty balloon 8.  Viabahn stent placement for extravasation/pseudoaneurysm from the midportion of the graft with 8 mm diameter by 15 cm length covered stent 9.  Viabahn stent placement to the left axillary and subclavian vein just proximal to the previous stents for greater than 50% residual stenosis after angioplasty  PRE-OPERATIVE DIAGNOSIS: 1. ESRD 2.  Thrombosed left brachial artery to axillary vein arteriovenous graft  POST-OPERATIVE DIAGNOSIS: same as above   SURGEON: Leotis Pain, MD  ANESTHESIA: local with Moderate Conscious Sedation for approximately 50 minutes using 5 mg of Versed and 100 Mcg of Fentanyl  ESTIMATED BLOOD LOSS: 30 cc  FINDING(S): 1. There was a swirling extravasation from the midportion of the graft early on which may have been a pre-existing pseudoaneurysm but was moderate to large in size and needed treatment.  There was a high-grade stenosis of the axillary vein/subclavian vein just proximal to the previously placed stents that may have been the cause of the graft failure  SPECIMEN(S):  None  CONTRAST: 70 cc  FLUORO TIME: 11.8 minutes  INDICATIONS: Patient is a 43 y.o.male who  presents with a thrombosed left brachial artery to axillary vein arteriovenous graft.  The patient is scheduled for an attempted declot and shuntogram.  The patient is aware the risks include but are not limited to: bleeding, infection, thrombosis of the cannulated access, and possible anaphylactic reaction to the contrast.  The patient is aware of the risks of the procedure and elects to proceed forward.  DESCRIPTION: After full informed written consent was obtained, the patient was brought back to the angiography suite and placed supine upon the angiography table.  The patient was connected to monitoring equipment. Moderate conscious sedation was administered with a face to face encounter with the patient throughout the procedure with my supervision of the RN administering medicines and monitoring the patient's vital signs, pulse oximetry, telemetry and mental status throughout from the start of the procedure until the patient was taken to the recovery room. The left arm was prepped and draped in the standard fashion for a percutaneous access intervention.  Under ultrasound guidance, the left brachial artery to axillary vein arteriovenous graft was cannulated with a micropuncture needle under direct ultrasound guidance due to the pulseless nature of the graft in both an antegrade and a retrograde fashion crossing, and permanent images were performed.  The microwire was advanced and the needle was exchanged for the a microsheath.  I then upsized to a 6 Fr Sheath and imaging was performed.  Hand injections were completed to image the access including the central venous system. This demonstrated no flow within the AV graft.  Based on the images, this patient will need extensive treatment to salvage the graft. I then gave the patient 3000 units of  intravenous heparin.  I then placed a Magic torque wire into the brachial artery from the retrograde sheath and into the clavian vein from the antegrade sheath. 4 mg of  TPA were deployed throughout the entirety of the graft and into the axillary and subclavian vein. This was allowed to dwell. Mechanical rheolytic thrombectomy was then performed throughout the graft and into the axillary and subclavian vein. There was a swirling extravasation from the midportion of the graft early on which may have been a pre-existing pseudoaneurysm but was moderate to large in size and needed treatment.  There was a high-grade stenosis of the axillary vein/subclavian vein just proximal to the previously placed stents that may have been the cause of the graft failure.  A residual arterial plug was also seen at the arterial anastomosis. An attempt to clear the arterial plug was done with 2 passes of the Fogarty embolectomy balloon. This resulted in resolution of the arterial plug, and clearance of the arterial side of the graft. The arterial outflow was seen to be intact as well on these images. The retrograde sheath was removed. I then turned my attention to the remaining issues.  A 7 mm diameter by 10 cm length angioplasty balloon was then brought on the field.  I use this to inflate the area in the mid to distal graft and hold the inflation for 2 to 3 minutes.  This was extravasation that we had created, this likely would have stopped it.  However there was no change after this consistent with this being a pre-existing pseudoaneurysm.  I advanced the balloon to the axillary and subclavian vein and initially the 7 mm balloon burst before burst inflation so we exchanged for a [place]ado balloon and inflated this to 16 atm in the axillary and subclavian vein just proximal to the previously placed stents.  There remained greater than 50% residual stenosis in the axillary and subclavian vein just proximal to the previously placed stents balloon I then elected to treat this with tension of the Viabahn stent and an 8 mm diameter by 2.5 cm length stent was deployed in the axillary and subclavian veins and  postdilated with an 8 mm diameter high-pressure angioplasty balloon with less than 10% residual stenosis and brisk flow and no further flow in the pseudoaneurysm.   Based on the completion imaging, no further intervention is necessary.  The wire and balloon were removed from the sheath.  A 4-0 Monocryl purse-string suture was sewn around the sheath.  The sheath was removed while tying down the suture.  A sterile bandage was applied to the puncture site.  COMPLICATIONS: None  CONDITION: Stable   Leotis Pain 03/02/2019 3:56 PM   This note was created with Dragon Medical transcription system. Any errors in dictation are purely unintentional.

## 2019-03-02 NOTE — Progress Notes (Signed)
[facility]   PATIENT NAME: Brett Small    MR#:  [id number]  DATE OF BIRTH:  04/22/1976  SUBJECTIVE:  CHIEF COMPLAINT: Patient is NPO, awaiting for permacath placement off BiPAP.  On 2 L of oxygen and feeling better denies any shortness of breath or wheezing  REVIEW OF SYSTEMS:  CONSTITUTIONAL: No fever, fatigue or weakness.  EYES: No blurred or double vision.  EARS, NOSE, AND THROAT: No tinnitus or ear pain.  RESPIRATORY: Intermittent episodes of cough, denies shortness of breath while resting.  Denies wheezing or hemoptysis s CARDIOVASCULAR: No chest pain, orthopnea, edema.  GASTROINTESTINAL: No nausea, vomiting, diarrhea or abdominal pain.  GENITOURINARY: No dysuria, hematuria.  ENDOCRINE: No polyuria, nocturia,  HEMATOLOGY: No anemia, easy bruising or bleeding SKIN: No rash or lesion. MUSCULOSKELETAL: No joint pain or arthritis.   NEUROLOGIC: No tingling, numbness, weakness.  PSYCHIATRY: No anxiety or depression.   DRUG ALLERGIES:   Allergies  Allergen Reactions  . Codeine Nausea Only    Patient questioned this (??)  . Sulfa Antibiotics Hives and Nausea And Vomiting    VITALS:  Blood pressure 139/90, pulse 93, temperature 97.9 F (36.6 C), temperature source Oral, resp. rate 16, height 5' 2" (1.575 m), weight 49.1 kg, SpO2 97 %.  PHYSICAL EXAMINATION:  GENERAL:  43 y.o.-year-old patient lying in the bed with no acute distress.  EYES: Pupils equal, round, reactive to light and accommodation. No scleral icterus. Extraocular muscles intact.  HEENT: Head atraumatic, normocephalic. Oropharynx and nasopharynx clear.  NECK:  Supple, no jugular venous distention. No thyroid enlargement, no tenderness.  LUNGS: Moderate breath sounds bilaterally, no wheezing, rales,rhonchi or crepitation. No use of accessory muscles of respiration.  CARDIOVASCULAR: S1, S2 normal. No murmurs, rubs, or gallops.  ABDOMEN: Soft, nontender,  nondistended. Bowel sounds present.  EXTREMITIES: No pedal edema, cyanosis, or clubbing.  NEUROLOGIC: Awake alert and oriented x3 sensation intact. Gait not checked.  PSYCHIATRIC: The patient is alert and oriented x 3.  SKIN: No obvious rash, lesion, or ulcer.    LABORATORY PANEL:   CBC Recent Labs  Lab 03/01/19 0445  WBC 11.5*  HGB 7.3*  HCT 22.7*  PLT 132*   ------------------------------------------------------------------------------------------------------------------  Chemistries  Recent Labs  Lab 02/28/19 1339 03/01/19 0445  NA 140 140  K 3.5 3.3*  CL 102 100  CO2 19* 28  GLUCOSE 182* 115*  BUN 54* 35*  CREATININE 7.60* 5.20*  CALCIUM 7.5* 8.0*  MG  --  1.8  AST 39  --   ALT 47*  --   ALKPHOS 96  --   BILITOT 0.7  --    ------------------------------------------------------------------------------------------------------------------  Cardiac Enzymes Recent Labs  Lab 03/01/19 0445  TROPONINI 0.07*   ------------------------------------------------------------------------------------------------------------------  RADIOLOGY:  Dg Chest Port 1 View  Result Date: 03/02/2019 CLINICAL DATA:  43-year-old male with respiratory failure. Negative for COVID-19 X3. EXAM: PORTABLE CHEST 1 VIEW COMPARISON:  02/28/2019 and earlier. FINDINGS: Portable AP upright view at 0631 hours. Regressed bilateral perihilar and basilar pulmonary opacity with residual peripheral confluent opacity at the right lung base. No superimposed pneumothorax or pulmonary edema. Mediastinal contours are within normal limits. Visualized tracheal air column is within normal limits. Left axillary vascular stent or graft redemonstrated. A small catheter projects at or over the right epigastrium. No acute osseous abnormality identified. Negative visible bowel gas pattern. IMPRESSION: Improved bilateral ventilation since 02/28/2019 with residual confluent opacity at the peripheral right lung base.  Electronically Signed     By: H  Hall M.D.   On: 03/02/2019 07:36   Dg Chest Portable 1 View  Result Date: 02/28/2019 CLINICAL DATA:  Respiratory distress and shortness of breath. Patient with end-stage renal disease. EXAM: PORTABLE CHEST 1 VIEW COMPARISON:  02/21/2019 and prior chest radiographs FINDINGS: Bilateral interstitial/mild airspace opacities are again noted. New superimposed RIGHT LOWER lung airspace opacity is now identified. No pleural effusion or pneumothorax. No acute bony abnormalities. Mild cardiomegaly again noted. IMPRESSION: 1. New RIGHT LOWER lung airspace opacity superimposed on unchanged bilateral interstitial/mild airspace opacities (likely edema), which may represent new focal edema versus pneumonia. 2. Mild cardiomegaly Electronically Signed   By: Jeffrey  Hu M.D.   On: 02/28/2019 14:26    EKG:   Orders placed or performed during the hospital encounter of 02/28/19  . ED EKG  . ED EKG    ASSESSMENT AND PLAN:   *Acute respiratory failure Secondary to healthcare associated pneumonia and missing hemodialysis Clinically improving.   Off BiPAP.  Currently on 2 L of oxygen via nasal cannula Continue  antibiotic. Check MRSA. COVID-19 test is negative. Further management as per intensivist.  *Sepsis secondary to Healthcare associated pneumonia Patient met septic criteria at the time of admission with tachycardia and elevated lactic acid Continue  antibiotics Procalcitonin at 4.01 lactic acid 2.0.Monitor lactic acid and procalcitonin which are elevated at this time Patient is on Omnicef Blood cultures negative so far  *Hypokalemia replete and recheck  *End-stage renal disease on hemodialysis Patient missed hemodialysis 02/28/2019 Nephrology is following Vascular consulted for temporary hemodialysis catheter placement and for urgent hemodialysis as AV graft in the left brachial area is malfunctioning Possible fistulogram and permacath placement on Monday  *Arterial  thrombosis He has history of aortic aneurysm surgery with bypass graft, and had a clot in there and since then he is on blood thinner. I will continue Eliquis while here.  *Hypertension Continue home meds.    #Tobacco abuse disorder counseled patient to quit smoking  All the records are reviewed and case discussed with Care Management/Social Workerr. Management plans discussed with the patient, HE is  in agreement.  CODE STATUS: FC   TOTAL TIME TAKING CARE OF THIS PATIENT: 35  minutes.   POSSIBLE D/C IN 1-2   DAYS, DEPENDING ON CLINICAL CONDITION.  Note: This dictation was prepared with Dragon dictation along with smaller phrase technology. Any transcriptional errors that result from this process are unintentional.   Aruna Gouru M.D on 03/02/2019 at 2:09 PM  Between 7am to 6pm - Pager - [phone] After 6pm go to [email or web address] - password [email or web address]  [facility]  Office  [phone]  CC: Primary care physician; Patient, [name] 

## 2019-03-02 NOTE — Progress Notes (Signed)
[facility]  ROUNDING NOTE   Subjective:   Emergent hemodialysis at admission due to pulmonary edema/acute respiratory failure requiring noninvasive ventilation with BIPAP. AVF was not functioning so emergent temp HD catheter was placed by vasculary surgery    Missed dialysis as outpatient due to AVF not being able to be accessed.  -Feeling well this morning Awaiting declot procedure 1600 cc of fluid was removed with hemodialysis Denies any nausea, vomiting     Objective:  Vital signs in last 24 hours:  Temp:  [98.2 F (36.8 C)-98.9 F (37.2 C)] 98.2 F (36.8 C) (05/11 0406) Pulse Rate:  [98-110] 104 (05/11 0406) Resp:  [16-18] 16 (05/11 0406) BP: (125-163)/(69-93) 127/72 (05/11 0406) SpO2:  [93 %-99 %] 93 % (05/11 0406)  Weight change:  Filed Weights   02/28/19 1754 02/28/19 2328 03/01/19 0230  Weight: 50.3 kg 50.3 kg 49.1 kg    Intake/Output: I/O last 3 completed shifts: In: -  Out: 2143 [Urine:550; BDZHG:9924]   Intake/Output this shift:  No intake/output data recorded.  Physical Exam: General:  No acute distress  Head: Normocephalic, atraumatic.  Eyes: Anicteric  Neck: Supple  Lungs:  clear  Heart: Regular, no rubs  Abdomen:  Soft, nontender, bowel sounds present  Extremities: no peripheral edema.  Neurologic: Awake, alert, follows commands  Skin: No lesions  Access: left arm AV access  No bruit or thrill, Right femoral temp HD catheter 5/9    Basic Metabolic Panel: Recent Labs  Lab 02/23/19 1018 02/24/19 0531 02/28/19 1339 03/01/19 0445  NA  --  140 140 140  K  --  4.3 3.5 3.3*  CL  --  98 102 100  CO2  --  29 19* 28  GLUCOSE  --  92 182* 115*  BUN  --  36* 54* 35*  CREATININE  --  4.64* 7.60* 5.20*  CALCIUM  --  8.6* 7.5* 8.0*  MG  --   --   --  1.8  PHOS 3.2  --   --  3.8    Liver Function Tests: Recent Labs  Lab 02/28/19 1339  AST 39  ALT 47*  ALKPHOS 96  BILITOT 0.7  PROT 7.6  ALBUMIN 3.6   No results for  input(s): LIPASE, AMYLASE in the last 168 hours. No results for input(s): AMMONIA in the last 168 hours.  CBC: Recent Labs  Lab 02/28/19 1339 03/01/19 0445  WBC 17.2* 11.5*  NEUTROABS 14.6*  --   HGB 9.2* 7.3*  HCT 30.1* 22.7*  MCV 102.4* 99.6  PLT 267 132*    Cardiac Enzymes: Recent Labs  Lab 02/28/19 1339 03/01/19 0445  TROPONINI 0.07* 0.07*    BNP: Invalid input(s): POCBNP  CBG: Recent Labs  Lab 02/28/19 1755  GLUCAP 118*    Microbiology: Results for orders placed or performed during the hospital encounter of 02/28/19  Culture, blood (routine x 2)     Status: None (Preliminary result)   Collection Time: 02/28/19  1:38 PM  Result Value Ref Range Status   Specimen Description BLOOD RIGHT ANTECUBITAL  Final   Special Requests   Final    BOTTLES DRAWN AEROBIC AND ANAEROBIC Blood Culture results may not be optimal due to an excessive volume of blood received in culture bottles   Culture   Final    NO GROWTH 2 DAYS Performed at [facility], [address], [place], [place] [address]    Report Status PENDING  Incomplete  Culture, blood (routine x 2)  Status: None (Preliminary result)   Collection Time: 02/28/19  2:05 PM  Result Value Ref Range Status   Specimen Description BLOOD LEFT ANTECUBITAL  Final   Special Requests   Final    BOTTLES DRAWN AEROBIC AND ANAEROBIC Blood Culture results may not be optimal due to an inadequate volume of blood received in culture bottles   Culture   Final    NO GROWTH 2 DAYS Performed at [facility], [address], [place], [place] [address]    Report Status PENDING  Incomplete  SARS Coronavirus 2 (CEPHEID - Performed in [place] hospital lab), Hosp Order     Status: None   Collection Time: 02/28/19  2:05 PM  Result Value Ref Range Status   SARS Coronavirus 2 NEGATIVE NEGATIVE Final    Comment: (NOTE) If result is NEGATIVE SARS-CoV-2 target nucleic acids are NOT DETECTED. The SARS-CoV-2 RNA is  generally detectable in upper and lower  respiratory specimens during the acute phase of infection. The lowest  concentration of SARS-CoV-2 viral copies this assay can detect is 250  copies / mL. A negative result does not preclude SARS-CoV-2 infection  and should not be used as the sole basis for treatment or other  patient management decisions.  A negative result may occur with  improper specimen collection / handling, submission of specimen other  than nasopharyngeal swab, presence of viral mutation(s) within the  areas targeted by this assay, and inadequate number of viral copies  (<250 copies / mL). A negative result must be combined with clinical  observations, patient history, and epidemiological information. If result is POSITIVE SARS-CoV-2 target nucleic acids are DETECTED. The SARS-CoV-2 RNA is generally detectable in upper and lower  respiratory specimens dur ing the acute phase of infection.  Positive  results are indicative of active infection with SARS-CoV-2.  Clinical  correlation with patient history and other diagnostic information is  necessary to determine patient infection status.  Positive results do  not rule out bacterial infection or co-infection with other viruses. If result is PRESUMPTIVE POSTIVE SARS-CoV-2 nucleic acids MAY BE PRESENT.   A presumptive positive result was obtained on the submitted specimen  and confirmed on repeat testing.  While 2019 novel coronavirus  (SARS-CoV-2) nucleic acids may be present in the submitted sample  additional confirmatory testing may be necessary for epidemiological  and / or clinical management purposes  to differentiate between  SARS-CoV-2 and other Sarbecovirus currently known to infect humans.  If clinically indicated additional testing with an alternate test  methodology [phone]) is advised. The SARS-CoV-2 RNA is generally  detectable in upper and lower respiratory sp ecimens during the acute  phase of  infection. The expected result is Negative. Fact Sheet for Patients:  StrictlyIdeas.no Fact Sheet for Healthcare Providers: BankingDealers.co.za This test is not yet approved or cleared by the [place] FDA and has been authorized for detection and/or diagnosis of SARS-CoV-2 by FDA under an Emergency Use Authorization (EUA).  This EUA will remain in effect (meaning this test can be used) for the duration of the COVID-19 declaration under Section 564(b)(1) of the Act, 21 U.S.C. section 360bbb-3(b)(1), unless the authorization is terminated or revoked sooner. Performed at [facility], [address] [place], [place] [address]     Coagulation Studies: No results for input(s): LABPROT, INR in the last 72 hours.  Urinalysis: No results for input(s): COLORURINE, LABSPEC, PHURINE, GLUCOSEU, HGBUR, BILIRUBINUR, KETONESUR, PROTEINUR, UROBILINOGEN, NITRITE, LEUKOCYTESUR in the last 72 hours.  Invalid  input(s): APPERANCEUR    Imaging: Dg Chest Port 1 View  Result Date: 03/02/2019 CLINICAL DATA:  43 year old male with respiratory failure. Negative for COVID-19 X3. EXAM: PORTABLE CHEST 1 VIEW COMPARISON:  02/28/2019 and earlier. FINDINGS: Portable AP upright view at 0631 hours. Regressed bilateral perihilar and basilar pulmonary opacity with residual peripheral confluent opacity at the right lung base. No superimposed pneumothorax or pulmonary edema. Mediastinal contours are within normal limits. Visualized tracheal air column is within normal limits. Left axillary vascular stent or graft redemonstrated. A small catheter projects at or over the right epigastrium. No acute osseous abnormality identified. Negative visible bowel gas pattern. IMPRESSION: Improved bilateral ventilation since 02/28/2019 with residual confluent opacity at the peripheral right lung base. Electronically Signed   By: Genevie Ann M.D.   On: 03/02/2019 07:36   Dg Chest  Portable 1 View  Result Date: 02/28/2019 CLINICAL DATA:  Respiratory distress and shortness of breath. Patient with end-stage renal disease. EXAM: PORTABLE CHEST 1 VIEW COMPARISON:  02/21/2019 and prior chest radiographs FINDINGS: Bilateral interstitial/mild airspace opacities are again noted. New superimposed RIGHT LOWER lung airspace opacity is now identified. No pleural effusion or pneumothorax. No acute bony abnormalities. Mild cardiomegaly again noted. IMPRESSION: 1. New RIGHT LOWER lung airspace opacity superimposed on unchanged bilateral interstitial/mild airspace opacities (likely edema), which may represent new focal edema versus pneumonia. 2. Mild cardiomegaly Electronically Signed   By: Margarette Canada M.D.   On: 02/28/2019 14:26     Medications:    . amLODipine  10 mg Oral Q2000  . apixaban  2.5 mg Oral BID  . aspirin EC  81 mg Oral Daily  . atorvastatin  80 mg Oral QHS  . chlorhexidine  15 mL Mouth Rinse BID  . Chlorhexidine Gluconate Cloth  6 each Topical Q0600  . furosemide  80 mg Oral Q M,W,F,Su-1800  . gabapentin  100 mg Oral TID  . hydrALAZINE  25 mg Oral Q8H  . loratadine  10 mg Oral Daily  . mouth rinse  15 mL Mouth Rinse q12n4p  . multivitamin  1 tablet Oral Daily  . sevelamer carbonate  1,600 mg Oral TID WC     Assessment/ Plan:  [name] is a 43 y.o. white male with end stage renal disease on hemodialysis, hypertension, depression, aortobifemoral bypass admitted to [facility] on 02/28/2019 for acute respiratory failure requiring BIPAP and complication of dialysis access.   [facility] TTS left AVG 46.5kg    1.  End stage renal disease on hemodialysis : emergent hemodialysis treatment via temp HD catheter.  Complication with AVF with no bruit or thrill - Vascular surgery scheduled for angiogram and possible thrombectomy today or tomorrow  2.  Anemia of chronic kidney disease:   - EPO with TTS HD treatments.  Lab Results  Component Value Date    HGB 7.3 (L) 03/01/2019    3.  Secondary hyperparathyroidism. Phosphorus and calcium at goal. Labs from 4/23 PTH 342 - at goal.  - Continue sevelamer 1600 mg p.o. 3 times daily. Lab Results  Component Value Date   CALCIUM 8.0 (L) 03/01/2019   PHOS 3.8 03/01/2019       LOS: 2 Brett Small 5/11/202010:01 AM

## 2019-03-02 NOTE — Progress Notes (Signed)
Spoke with Dr Lucky Cowboy regarding possible perm cath today. Dr Lucky Cowboy is not sure if pt will get it today. Ok to give Eliquis per Dr Lucky Cowboy, also keep pt NPO for now.

## 2019-03-02 NOTE — TOC Initial Note (Signed)
Transition of Care [facility]) - Initial/Assessment Note    Patient Details  Name: Brett Small MRN: [id number] Date of Birth: [birth year]  Transition of [facility]) CM/SW Contact:    Beverly Sessions, RN Phone Number: 03/02/2019, 2:39 PM  Clinical Narrative:                    [facility] consult for high risk readmission.  Patient off the floor.  RNCM to follow up     Patient Goals and CMS Choice        Expected Discharge Plan and Services                                                Prior Living Arrangements/Services                       Activities of Daily Living Home Assistive Devices/Equipment: None ADL Screening (condition at time of admission) Patient's cognitive ability adequate to safely complete daily activities?: Yes Is the patient deaf or have difficulty hearing?: No Does the patient have difficulty seeing, even when wearing glasses/contacts?: No Does the patient have difficulty concentrating, remembering, or making decisions?: No Patient able to express need for assistance with ADLs?: Yes Does the patient have difficulty dressing or bathing?: No Independently performs ADLs?: Yes (appropriate for developmental age) Does the patient have difficulty walking or climbing stairs?: No Weakness of Legs: None Weakness of Arms/Hands: None  Permission Sought/Granted                  Emotional Assessment              Admission diagnosis:  Acute respiratory failure with hypoxia ([place]) [J96.01] HCAP (healthcare-associated pneumonia) [J18.9] Acute on chronic congestive heart failure, unspecified heart failure type ([place]) [I50.9] Patient Active Problem List   Diagnosis Date Noted  . Sepsis ([place]) 02/28/2019  . Lobar pneumonia ([place]) 02/28/2019  . Acute respiratory failure ([place]) 02/04/2019  . Acute respiratory failure with hypoxemia ([place]) 01/27/2019  . Depression 01/07/2019  . MDD (major depressive disorder), single episode, severe , no  psychosis ([place])   . Homelessness   . Acute respiratory failure with hypoxia ([place]) 12/25/2018  . End stage renal disease ([place]) 12/25/2018  . Hypertension 12/25/2018  . Renal osteodystrophy 12/25/2018   PCP:  Patient, [name] Pharmacy:   [facility] [place] [address] [place] [address] Phone: [phone] Fax: [phone]  [place], [place] [address] [place] [address] Phone: [phone] Fax: [phone]     Social Determinants of Health (SDOH) Interventions    Readmission Risk Interventions Readmission Risk Prevention Plan 02/24/2019 02/09/2019 02/09/2019  Transportation Screening Complete - (No Data)  Medication Review Press photographer) Complete - Complete  PCP or Specialist appointment within 3-5 days of discharge Complete Patient refused -  PCP/Specialist Appt Not Complete comments - patient to follow up at outpatient palliative.  Patient states he has not followed obtaining a PCP.  Patient declined for new appointment to be made -  [place] or Home Care Consult Complete (No Data) -  SW Recovery Care/Counseling Consult Complete - -  Palliative Care Screening Not Applicable Not Applicable -  [place] Not Applicable Not Applicable -

## 2019-03-03 ENCOUNTER — Encounter: Payer: Self-pay | Admitting: Vascular Surgery

## 2019-03-03 DIAGNOSIS — Z992 Dependence on renal dialysis: Secondary | ICD-10-CM

## 2019-03-03 DIAGNOSIS — N186 End stage renal disease: Secondary | ICD-10-CM

## 2019-03-03 DIAGNOSIS — Z9889 Other specified postprocedural states: Secondary | ICD-10-CM

## 2019-03-03 LAB — CBC
HCT: 21.4 % — ABNORMAL LOW (ref 39.0–52.0)
Hemoglobin: 6.8 g/dL — ABNORMAL LOW (ref 13.0–17.0)
MCH: 31.2 pg (ref 26.0–34.0)
MCHC: 31.8 g/dL (ref 30.0–36.0)
MCV: 98.2 fL (ref 80.0–100.0)
Platelets: 203 10*3/uL (ref 150–400)
RBC: 2.18 MIL/uL — ABNORMAL LOW (ref 4.22–5.81)
RDW: 15.5 % (ref 11.5–15.5)
WBC: 9.4 10*3/uL (ref 4.0–10.5)
nRBC: 0 % (ref 0.0–0.2)

## 2019-03-03 LAB — PREPARE RBC (CROSSMATCH)

## 2019-03-03 LAB — T4, FREE: Free T4: 0.9 ng/dL (ref 0.82–1.77)

## 2019-03-03 MED ORDER — MORPHINE SULFATE (PF) 2 MG/ML IV SOLN
2.0000 mg | Freq: Once | INTRAVENOUS | Status: AC
  Administered 2019-03-03: 2 mg via INTRAVENOUS
  Filled 2019-03-03: qty 1

## 2019-03-03 MED ORDER — EPOETIN ALFA 10000 UNIT/ML IJ SOLN
4000.0000 [IU] | Freq: Once | INTRAMUSCULAR | Status: AC
  Administered 2019-03-03: 4000 [IU] via INTRAVENOUS

## 2019-03-03 MED ORDER — TRAMADOL HCL 50 MG PO TABS: 50.0000 mg | ORAL_TABLET | Freq: Two times a day (BID) | ORAL | 0 refills | Status: DC | PRN

## 2019-03-03 MED ORDER — LORATADINE 10 MG PO TABS: 10.0000 mg | ORAL_TABLET | Freq: Every day | ORAL | 0 refills | Status: DC

## 2019-03-03 MED ORDER — ACETAMINOPHEN 325 MG PO TABS: 650.0000 mg | ORAL_TABLET | Freq: Once | ORAL | Status: DC

## 2019-03-03 MED ORDER — CEFDINIR 300 MG PO CAPS: 300.0000 mg | ORAL_CAPSULE | Freq: Two times a day (BID) | ORAL | 0 refills | Status: DC

## 2019-03-03 MED ORDER — SODIUM CHLORIDE 0.9% IV SOLUTION: Freq: Once | INTRAVENOUS | Status: DC

## 2019-03-03 MED ORDER — DIPHENHYDRAMINE HCL 25 MG PO CAPS
25.0000 mg | ORAL_CAPSULE | Freq: Once | ORAL | Status: DC
  Filled 2019-03-03: qty 1

## 2019-03-03 NOTE — Progress Notes (Signed)
 Vein and Vascular Surgery  Daily Progress Note   Subjective  - 1 Day Post-Op  Patient seen while on dialysis.  No events overnight.  Access is working well for dialysis  Objective Vitals:   03/03/19 1230 03/03/19 1245 03/03/19 1300 03/03/19 1312  BP: (!) 153/82 (!) 160/85 (!) 160/85 (!) 144/86  Pulse: 100 100 100   Resp: 13 10  16   Temp:    98.4 F (36.9 C)  TempSrc:    Oral  SpO2:  96% 96%   Weight:   48 kg   Height:        Intake/Output Summary (Last 24 hours) at 03/03/2019 1312 Last data filed at 03/03/2019 1300 Gross per 24 hour  Intake 350 ml  Output 2655 ml  Net -2305 ml    PULM  CTAB CV  RRR VASC  left arm AV graft currently cannulated and getting dialysis through the graft  Laboratory CBC    Component Value Date/Time   WBC 9.4 03/03/2019 0324   HGB 6.8 (L) 03/03/2019 0324   HCT 21.4 (L) 03/03/2019 0324   PLT 203 03/03/2019 0324    BMET    Component Value Date/Time   NA 140 03/01/2019 0445   K 3.3 (L) 03/01/2019 0445   CL 100 03/01/2019 0445   CO2 28 03/01/2019 0445   GLUCOSE 115 (H) 03/01/2019 0445   BUN 35 (H) 03/01/2019 0445   CREATININE 5.20 (H) 03/01/2019 0445   CALCIUM 8.0 (L) 03/01/2019 0445   GFRNONAA 12 (L) 03/01/2019 0445   GFRAA 14 (L) 03/01/2019 0445    Assessment/Planning: POD #1 s/p LUE AVG declot   Patient seen while on dialysis and his access is working well.  No further recommendations from vascular point of view  Can be discharged home after dialysis if felt stable otherwise.  Follow-up with [place] in the office in 4 to 6 weeks with HDA duplex      Leotis Pain  03/03/2019, 1:12 PM

## 2019-03-03 NOTE — Care Management Important Message (Signed)
Important Message  Patient Details  Name: Brett Small MRN: [id number] Date of Birth: [birth year]   Medicare Important Message Given:  Yes    Dannette Barbara 03/03/2019, 10:48 AM

## 2019-03-03 NOTE — Discharge Summary (Signed)
[facility] NAME: Brett Small    MR#:  [id number]  DATE OF BIRTH:  1976/06/27  DATE OF ADMISSION:  02/28/2019 ADMITTING PHYSICIAN: Vaughan Basta, MD  DATE OF DISCHARGE: No discharge date for patient encounter.  PRIMARY CARE PHYSICIAN: Patient, [name]    ADMISSION DIAGNOSIS:  Acute respiratory failure with hypoxia (HCC) [J96.01] HCAP (healthcare-associated pneumonia) [J18.9] Acute on chronic congestive heart failure, unspecified heart failure type ([place]) [I50.9]  DISCHARGE DIAGNOSIS:  Principal Problem:   Sepsis ([place]) Active Problems:   Acute respiratory failure (HCC)   Lobar pneumonia ([place])   SECONDARY DIAGNOSIS:   Past Medical History:  Diagnosis Date  . Hypertension   . Renal disorder     HOSPITAL COURSE:  *Acute respiratory failure Secondary to healthcare associated pneumonia and missing hemodialysis Resolved with hemodialysis  *Acute dysfunctional left AV fistula Status post declotting by vascular surgery Functioning well, status post hemodialysis on day of discharge  *Acute fluid overload state Secondary to missed hemodialysis Resolved with hemodialysis  *Chronic end-stage renal disease Nephrology consulted for hemodialysis needs Hemodialysis done on day of discharge  *Acute sepsis secondary to Healthcare associated pneumonia Resolved Treated on our sepsis protocol, treated with empiric Omnicef  *Hypokalemia  Repleted  *Arterial thrombosis He has history of aortic aneurysm surgery with bypass graft,and had a clot in there and since then he is on blood thinner. Continued Eliquis   *Hypertension Continue home meds  DISCHARGE CONDITIONS:   stable  CONSULTS OBTAINED:  Treatment Team:  Lavonia Dana, MD Evaristo Bury, MD  DRUG ALLERGIES:   Allergies  Allergen Reactions  . Codeine Nausea Only    Patient questioned this (??)  . Sulfa Antibiotics Hives and Nausea  And Vomiting    DISCHARGE MEDICATIONS:   Allergies as of 03/03/2019      Reactions   Codeine Nausea Only   Patient questioned this (??)   Sulfa Antibiotics Hives, Nausea And Vomiting      Medication List    TAKE these medications   amLODipine 10 MG tablet Commonly known as:  NORVASC Take 1 tablet (10 mg total) by mouth daily at 8 pm.   apixaban 2.5 MG Tabs tablet Commonly known as:  ELIQUIS Take 1 tablet (2.5 mg total) by mouth 2 (two) times daily.   aspirin EC 81 MG tablet Take 1 tablet (81 mg total) by mouth daily.   atorvastatin 80 MG tablet Commonly known as:  LIPITOR Take 1 tablet (80 mg total) by mouth at bedtime.   carvedilol 25 MG tablet Commonly known as:  COREG Take 1 tablet (25 mg total) by mouth 2 (two) times daily with a meal.   doxycycline 100 MG capsule Commonly known as:  VIBRAMYCIN Take 1 capsule (100 mg total) by mouth 2 (two) times daily for 7 days.   furosemide 80 MG tablet Commonly known as:  Lasix Take 1 tablet (80 mg total) by mouth every Monday,Wednesday,Friday, and Sunday at 6 PM. Non dialysis days   gabapentin 100 MG capsule Commonly known as:  NEURONTIN Take 1 capsule (100 mg total) by mouth 3 (three) times daily.   hydrALAZINE 25 MG tablet Commonly known as:  APRESOLINE Take 1 tablet (25 mg total) by mouth every 8 (eight) hours.   hydrOXYzine 25 MG tablet Commonly known as:  ATARAX/VISTARIL Take 25 mg by mouth 3 (three) times daily as needed for anxiety.   loratadine 10 MG tablet Commonly known as:  CLARITIN  Take 1 tablet (10 mg total) by mouth daily. Start taking on:  Mar 04, 2019   multivitamin Tabs tablet Take 1 tablet by mouth daily.   neomycin-bacitracin-polymyxin Oint Commonly known as:  NEOSPORIN Apply 1 application topically 2 (two) times daily.   sevelamer carbonate 800 MG tablet Commonly known as:  RENVELA Take 2 tablets (1,600 mg total) by mouth 3 (three) times daily with meals.   traMADol 50 MG  tablet Commonly known as:  ULTRAM Take 1 tablet (50 mg total) by mouth every 12 (twelve) hours as needed for moderate pain or severe pain.        DISCHARGE INSTRUCTIONS:    If you experience worsening of your admission symptoms, develop shortness of breath, life threatening emergency, suicidal or homicidal thoughts you must seek medical attention immediately by calling 911 or calling your MD immediately  if symptoms less severe.  You Must read complete instructions/literature along with all the possible adverse reactions/side effects for all the Medicines you take and that have been prescribed to you. Take any new Medicines after you have completely understood and accept all the possible adverse reactions/side effects.   Please note  You were cared for by a hospitalist during your hospital stay. If you have any questions about your discharge medications or the care you received while you were in the hospital after you are discharged, you can call the unit and asked to speak with the hospitalist on call if the hospitalist that took care of you is not available. Once you are discharged, your primary care physician will handle any further medical issues. Please note that NO REFILLS for any discharge medications will be authorized once you are discharged, as it is imperative that you return to your primary care physician (or establish a relationship with a primary care physician if you do not have one) for your aftercare needs so that they can reassess your need for medications and monitor your lab values.    Today   CHIEF COMPLAINT:   Chief Complaint  Patient presents with  . Respiratory Distress    HISTORY OF PRESENT ILLNESS:  43 y.o. male with a known history of hypertension, end-stage renal disease on hemodialysis, aortofemoral bypass with blood clot in the in the past and requiring lifelong anticoagulation since then-had some shortness of breath since today morning.  Went for his  hemodialysis they could not access his fistula.  He came to emergency room.  He is noted to be tachycardic and hypoxic with elevated white blood cell count and lactic acid level.  His chest x-ray is showing lobar pneumonia.  His COVID-19 test is negative.  He was initially hypoxic in spite of requiring supplemental oxygen so started on BiPAP by ER physician given to hospitalist team after starting broad-spectrum antibiotics.  VITAL SIGNS:  Blood pressure (!) 144/80, pulse (!) 107, temperature 98 F (36.7 C), temperature source Oral, resp. rate 14, height 5\' 2"  (1.575 m), weight 48 kg, SpO2 96 %.  I/O:    Intake/Output Summary (Last 24 hours) at 03/03/2019 1457 Last data filed at 03/03/2019 1300 Gross per 24 hour  Intake 350 ml  Output 2655 ml  Net -2305 ml    PHYSICAL EXAMINATION:  GENERAL:  43 y.o.-year-old patient lying in the bed with no acute distress.  EYES: Pupils equal, round, reactive to light and accommodation. No scleral icterus. Extraocular muscles intact.  HEENT: Head atraumatic, normocephalic. Oropharynx and nasopharynx clear.  NECK:  Supple, no jugular venous distention. No thyroid  enlargement, no tenderness.  LUNGS: Normal breath sounds bilaterally, no wheezing, rales,rhonchi or crepitation. No use of accessory muscles of respiration.  CARDIOVASCULAR: S1, S2 normal. No murmurs, rubs, or gallops.  ABDOMEN: Soft, non-tender, non-distended. Bowel sounds present. No organomegaly or mass.  EXTREMITIES: No pedal edema, cyanosis, or clubbing.  NEUROLOGIC: Cranial nerves II through XII are intact. Muscle strength 5/5 in all extremities. Sensation intact. Gait not checked.  PSYCHIATRIC: The patient is alert and oriented x 3.  SKIN: No obvious rash, lesion, or ulcer.   DATA REVIEW:   CBC Recent Labs  Lab 03/03/19 0324  WBC 9.4  HGB 6.8*  HCT 21.4*  PLT 203    Chemistries  Recent Labs  Lab 02/28/19 1339 03/01/19 0445  NA 140 140  K 3.5 3.3*  CL 102 100  CO2 19* 28   GLUCOSE 182* 115*  BUN 54* 35*  CREATININE 7.60* 5.20*  CALCIUM 7.5* 8.0*  MG  --  1.8  AST 39  --   ALT 47*  --   ALKPHOS 96  --   BILITOT 0.7  --     Cardiac Enzymes Recent Labs  Lab 03/01/19 0445  TROPONINI 0.07*    Microbiology Results  Results for orders placed or performed during the hospital encounter of 02/28/19  Culture, blood (routine x 2)     Status: None (Preliminary result)   Collection Time: 02/28/19  1:38 PM  Result Value Ref Range Status   Specimen Description BLOOD RIGHT ANTECUBITAL  Final   Special Requests   Final    BOTTLES DRAWN AEROBIC AND ANAEROBIC Blood Culture results may not be optimal due to an excessive volume of blood received in culture bottles   Culture   Final    NO GROWTH 3 DAYS Performed at [facility], [address] [place], [place] [address]    Report Status PENDING  Incomplete  Culture, blood (routine x 2)     Status: None (Preliminary result)   Collection Time: 02/28/19  2:05 PM  Result Value Ref Range Status   Specimen Description BLOOD LEFT ANTECUBITAL  Final   Special Requests   Final    BOTTLES DRAWN AEROBIC AND ANAEROBIC Blood Culture results may not be optimal due to an inadequate volume of blood received in culture bottles   Culture   Final    NO GROWTH 3 DAYS Performed at [facility], [address], [place], [place] [address]    Report Status PENDING  Incomplete  SARS Coronavirus 2 (CEPHEID - Performed in [place] hospital lab), Hosp Order     Status: None   Collection Time: 02/28/19  2:05 PM  Result Value Ref Range Status   SARS Coronavirus 2 NEGATIVE NEGATIVE Final    Comment: (NOTE) If result is NEGATIVE SARS-CoV-2 target nucleic acids are NOT DETECTED. The SARS-CoV-2 RNA is generally detectable in upper and lower  respiratory specimens during the acute phase of infection. The lowest  concentration of SARS-CoV-2 viral copies this assay can detect is 250  copies / mL. A negative result  does not preclude SARS-CoV-2 infection  and should not be used as the sole basis for treatment or other  patient management decisions.  A negative result may occur with  improper specimen collection / handling, submission of specimen other  than nasopharyngeal swab, presence of viral mutation(s) within the  areas targeted by this assay, and inadequate number of viral copies  (<250 copies / mL). A negative result must be combined  with clinical  observations, patient history, and epidemiological information. If result is POSITIVE SARS-CoV-2 target nucleic acids are DETECTED. The SARS-CoV-2 RNA is generally detectable in upper and lower  respiratory specimens dur ing the acute phase of infection.  Positive  results are indicative of active infection with SARS-CoV-2.  Clinical  correlation with patient history and other diagnostic information is  necessary to determine patient infection status.  Positive results do  not rule out bacterial infection or co-infection with other viruses. If result is PRESUMPTIVE POSTIVE SARS-CoV-2 nucleic acids MAY BE PRESENT.   A presumptive positive result was obtained on the submitted specimen  and confirmed on repeat testing.  While 2019 novel coronavirus  (SARS-CoV-2) nucleic acids may be present in the submitted sample  additional confirmatory testing may be necessary for epidemiological  and / or clinical management purposes  to differentiate between  SARS-CoV-2 and other Sarbecovirus currently known to infect humans.  If clinically indicated additional testing with an alternate test  methodology [phone]) is advised. The SARS-CoV-2 RNA is generally  detectable in upper and lower respiratory sp ecimens during the acute  phase of infection. The expected result is Negative. Fact Sheet for Patients:  StrictlyIdeas.no Fact Sheet for Healthcare Providers: BankingDealers.co.za This test is not yet approved or  cleared by the [place] FDA and has been authorized for detection and/or diagnosis of SARS-CoV-2 by FDA under an Emergency Use Authorization (EUA).  This EUA will remain in effect (meaning this test can be used) for the duration of the COVID-19 declaration under Section 564(b)(1) of the Act, 21 U.S.C. section 360bbb-3(b)(1), unless the authorization is terminated or revoked sooner. Performed at [facility], [address] [place], [place] [address]     RADIOLOGY:  Dg Chest Port 1 View  Result Date: 03/02/2019 CLINICAL DATA:  43 year old male with respiratory failure. Negative for COVID-19 X3. EXAM: PORTABLE CHEST 1 VIEW COMPARISON:  02/28/2019 and earlier. FINDINGS: Portable AP upright view at 0631 hours. Regressed bilateral perihilar and basilar pulmonary opacity with residual peripheral confluent opacity at the right lung base. No superimposed pneumothorax or pulmonary edema. Mediastinal contours are within normal limits. Visualized tracheal air column is within normal limits. Left axillary vascular stent or graft redemonstrated. A small catheter projects at or over the right epigastrium. No acute osseous abnormality identified. Negative visible bowel gas pattern. IMPRESSION: Improved bilateral ventilation since 02/28/2019 with residual confluent opacity at the peripheral right lung base. Electronically Signed   By: Genevie Ann M.D.   On: 03/02/2019 07:36    EKG:   Orders placed or performed during the hospital encounter of 02/28/19  . ED EKG  . ED EKG      Management plans discussed with the patient, family and they are in agreement.  CODE STATUS:     Code Status Orders  (From admission, onward)         Start     Ordered   02/28/19 1805  Full code  Continuous     02/28/19 1804        Code Status History    Date Active Date Inactive Code Status Order ID Comments User Context   02/21/2019 0835 02/24/2019 1945 Full Code [id number]  Harrie Foreman, MD Inpatient    02/04/2019 1343 02/09/2019 2050 Full Code [id number]  Saundra Shelling, MD ED   01/27/2019 0654 01/29/2019 2049 Partial Code [id number]  Harrie Foreman, MD Inpatient   01/07/2019 2156 01/27/2019 0300 Partial Code [id number]  Tennis Ship, MD Inpatient  12/25/2018 0158 01/07/2019 2027 Partial Code [id number]  Ina Homes, MD ED      TOTAL TIME TAKING CARE OF THIS PATIENT: 35 minutes.    Avel Peace Aerie Donica M.D on 03/03/2019 at 2:57 PM  Between 7am to 6pm - Pager - [phone]  After 6pm go to [email or web address] - password [email or web address] [facility]  Office  [phone]  CC: Primary care physician; Patient, [name]   Note: This dictation was prepared with Dragon dictation along with smaller phrase technology. Any transcriptional errors that result from this process are unintentional.

## 2019-03-03 NOTE — Progress Notes (Signed)
[facility]  ROUNDING NOTE   Subjective:   Emergent hemodialysis at admission due to pulmonary edema/acute respiratory failure requiring noninvasive ventilation with BIPAP. AV acces was not functioning so emergent temp HD catheter was placed by vasculary surgery    AV graft was declotted by Dr. Lucky Cowboy yesterday.  Complains of pain over the surgical site.  Patient is refusing cannulation.  He allowed it after pain improved with IV morphine  Working well with dialysis today   HEMODIALYSIS FLOWSHEET:  Blood Flow Rate (mL/min): 350 mL/min Arterial Pressure (mmHg): -140 mmHg Venous Pressure (mmHg): 190 mmHg Transmembrane Pressure (mmHg): 70 mmHg Ultrafiltration Rate (mL/min): 1000 mL/min Dialysate Flow Rate (mL/min): 600 ml/min Conductivity: Machine : 13.6 Conductivity: Machine : 13.6 Dialysis Fluid Bolus: Blood Products Bolus Amount (mL): 350 mL Dialysate Change: 4K    Objective:  Vital signs in last 24 hours:  Temp:  [97.7 F (36.5 C)-98.9 F (37.2 C)] 98 F (36.7 C) (05/12 1345) Pulse Rate:  [52-120] 107 (05/12 1345) Resp:  [10-19] 14 (05/12 1345) BP: (124-162)/(62-96) 144/80 (05/12 1345) SpO2:  [92 %-100 %] 96 % (05/12 1345) Weight:  [48 kg-50.3 kg] 48 kg (05/12 1300)  Weight change:  Filed Weights   03/02/19 1401 03/03/19 0840 03/03/19 1300  Weight: 49.1 kg 50.3 kg 48 kg    Intake/Output: I/O last 3 completed shifts: In: -  Out: 650 [Urine:650]   Intake/Output this shift:  Total I/O In: 350 [Blood:350] Out: 2505 [Other:2505]  Physical Exam: General:  No acute distress  Head: Normocephalic, atraumatic.  Eyes: Anicteric  Neck: Supple  Lungs:  clear  Heart: Regular, no rubs  Abdomen:  Soft, nontender, bowel sounds present  Extremities: no peripheral edema.  Neurologic: Awake, alert, follows commands  Skin: No lesions  Access: left arm AV access  No bruit or thrill, Right femoral temp HD catheter 5/9    Basic Metabolic Panel: Recent Labs   Lab 02/28/19 1339 03/01/19 0445  NA 140 140  K 3.5 3.3*  CL 102 100  CO2 19* 28  GLUCOSE 182* 115*  BUN 54* 35*  CREATININE 7.60* 5.20*  CALCIUM 7.5* 8.0*  MG  --  1.8  PHOS  --  3.8    Liver Function Tests: Recent Labs  Lab 02/28/19 1339  AST 39  ALT 47*  ALKPHOS 96  BILITOT 0.7  PROT 7.6  ALBUMIN 3.6   No results for input(s): LIPASE, AMYLASE in the last 168 hours. No results for input(s): AMMONIA in the last 168 hours.  CBC: Recent Labs  Lab 02/28/19 1339 03/01/19 0445 03/03/19 0324  WBC 17.2* 11.5* 9.4  NEUTROABS 14.6*  --   --   HGB 9.2* 7.3* 6.8*  HCT 30.1* 22.7* 21.4*  MCV 102.4* 99.6 98.2  PLT 267 132* 203    Cardiac Enzymes: Recent Labs  Lab 02/28/19 1339 03/01/19 0445  TROPONINI 0.07* 0.07*    BNP: Invalid input(s): POCBNP  CBG: Recent Labs  Lab 02/28/19 1755  GLUCAP 118*    Microbiology: Results for orders placed or performed during the hospital encounter of 02/28/19  Culture, blood (routine x 2)     Status: None (Preliminary result)   Collection Time: 02/28/19  1:38 PM  Result Value Ref Range Status   Specimen Description BLOOD RIGHT ANTECUBITAL  Final   Special Requests   Final    BOTTLES DRAWN AEROBIC AND ANAEROBIC Blood Culture results may not be optimal due to an excessive volume of blood received in culture bottles  Culture   Final    NO GROWTH 3 DAYS Performed at [facility], [address] [place], [place] [address]    Report Status PENDING  Incomplete  Culture, blood (routine x 2)     Status: None (Preliminary result)   Collection Time: 02/28/19  2:05 PM  Result Value Ref Range Status   Specimen Description BLOOD LEFT ANTECUBITAL  Final   Special Requests   Final    BOTTLES DRAWN AEROBIC AND ANAEROBIC Blood Culture results may not be optimal due to an inadequate volume of blood received in culture bottles   Culture   Final    NO GROWTH 3 DAYS Performed at [facility], [address],  [place], [place] [address]    Report Status PENDING  Incomplete  SARS Coronavirus 2 (CEPHEID - Performed in [place] hospital lab), Hosp Order     Status: None   Collection Time: 02/28/19  2:05 PM  Result Value Ref Range Status   SARS Coronavirus 2 NEGATIVE NEGATIVE Final    Comment: (NOTE) If result is NEGATIVE SARS-CoV-2 target nucleic acids are NOT DETECTED. The SARS-CoV-2 RNA is generally detectable in upper and lower  respiratory specimens during the acute phase of infection. The lowest  concentration of SARS-CoV-2 viral copies this assay can detect is 250  copies / mL. A negative result does not preclude SARS-CoV-2 infection  and should not be used as the sole basis for treatment or other  patient management decisions.  A negative result may occur with  improper specimen collection / handling, submission of specimen other  than nasopharyngeal swab, presence of viral mutation(s) within the  areas targeted by this assay, and inadequate number of viral copies  (<250 copies / mL). A negative result must be combined with clinical  observations, patient history, and epidemiological information. If result is POSITIVE SARS-CoV-2 target nucleic acids are DETECTED. The SARS-CoV-2 RNA is generally detectable in upper and lower  respiratory specimens dur ing the acute phase of infection.  Positive  results are indicative of active infection with SARS-CoV-2.  Clinical  correlation with patient history and other diagnostic information is  necessary to determine patient infection status.  Positive results do  not rule out bacterial infection or co-infection with other viruses. If result is PRESUMPTIVE POSTIVE SARS-CoV-2 nucleic acids MAY BE PRESENT.   A presumptive positive result was obtained on the submitted specimen  and confirmed on repeat testing.  While 2019 novel coronavirus  (SARS-CoV-2) nucleic acids may be present in the submitted sample  additional confirmatory testing may be  necessary for epidemiological  and / or clinical management purposes  to differentiate between  SARS-CoV-2 and other Sarbecovirus currently known to infect humans.  If clinically indicated additional testing with an alternate test  methodology [phone]) is advised. The SARS-CoV-2 RNA is generally  detectable in upper and lower respiratory sp ecimens during the acute  phase of infection. The expected result is Negative. Fact Sheet for Patients:  StrictlyIdeas.no Fact Sheet for Healthcare Providers: BankingDealers.co.za This test is not yet approved or cleared by the [place] FDA and has been authorized for detection and/or diagnosis of SARS-CoV-2 by FDA under an Emergency Use Authorization (EUA).  This EUA will remain in effect (meaning this test can be used) for the duration of the COVID-19 declaration under Section 564(b)(1) of the Act, 21 U.S.C. section 360bbb-3(b)(1), unless the authorization is terminated or revoked sooner. Performed at [facility], [address] [place], [place] [address]  Coagulation Studies: No results for input(s): LABPROT, INR in the last 72 hours.  Urinalysis: No results for input(s): COLORURINE, LABSPEC, PHURINE, GLUCOSEU, HGBUR, BILIRUBINUR, KETONESUR, PROTEINUR, UROBILINOGEN, NITRITE, LEUKOCYTESUR in the last 72 hours.  Invalid input(s): APPERANCEUR    Imaging: Dg Chest Port 1 View  Result Date: 03/02/2019 CLINICAL DATA:  43 year old male with respiratory failure. Negative for COVID-19 X3. EXAM: PORTABLE CHEST 1 VIEW COMPARISON:  02/28/2019 and earlier. FINDINGS: Portable AP upright view at 0631 hours. Regressed bilateral perihilar and basilar pulmonary opacity with residual peripheral confluent opacity at the right lung base. No superimposed pneumothorax or pulmonary edema. Mediastinal contours are within normal limits. Visualized tracheal air column is within normal limits. Left  axillary vascular stent or graft redemonstrated. A small catheter projects at or over the right epigastrium. No acute osseous abnormality identified. Negative visible bowel gas pattern. IMPRESSION: Improved bilateral ventilation since 02/28/2019 with residual confluent opacity at the peripheral right lung base. Electronically Signed   By: Genevie Ann M.D.   On: 03/02/2019 07:36     Medications:    . sodium chloride   Intravenous Once  . acetaminophen  650 mg Oral Once  . amLODipine  10 mg Oral Q2000  . apixaban  2.5 mg Oral BID  . aspirin EC  81 mg Oral Daily  . atorvastatin  80 mg Oral QHS  . chlorhexidine  15 mL Mouth Rinse BID  . Chlorhexidine Gluconate Cloth  6 each Topical Q0600  . diphenhydrAMINE  25 mg Oral Once  . furosemide  80 mg Oral Q M,W,F,Su-1800  . gabapentin  100 mg Oral TID  . hydrALAZINE  25 mg Oral Q8H  . loratadine  10 mg Oral Daily  . mouth rinse  15 mL Mouth Rinse q12n4p  . multivitamin  1 tablet Oral Daily  . sevelamer carbonate  1,600 mg Oral TID WC     Assessment/ Plan:  Mr. Dorthy Hustead is a 43 y.o. white male with end stage renal disease on hemodialysis, hypertension, depression, aortobifemoral bypass admitted to [facility] on 02/28/2019 for acute respiratory failure requiring BIPAP and complication of dialysis access.   [place] TTS left AVG 46.5kg    1.  End stage renal disease on hemodialysis : emergent hemodialysis treatment via temp HD catheter.  Complication of dialysis device-clotted AV graft Graft was declotted 03/02/2019 Good blood flow achieved today Patient seen during dialysis Tolerating well   2.  Anemia of chronic kidney disease:   - EPO with TTS HD treatments.  Lab Results  Component Value Date   HGB 6.8 (L) 03/03/2019  Blood transfusion planned  3.  Secondary hyperparathyroidism. Phosphorus and calcium at goal. Labs from 4/23 PTH 342 - at goal.  - Continue sevelamer 1600 mg p.o. 3 times daily. Lab Results  Component  Value Date   CALCIUM 8.0 (L) 03/01/2019   PHOS 3.8 03/01/2019       LOS: 3 Winna Golla 5/12/20201:51 PM

## 2019-03-03 NOTE — Progress Notes (Signed)
Hemodialysis- Patient does not want avg cannulated today due to pain. Dr. Candiss Norse to bedside. Pain medication ordered as well as order to change to smaller needle size for patient comfort. Will start HD after medication.

## 2019-03-03 NOTE — Progress Notes (Signed)
Hemodialysis- transfusion completed without issue. No s/sx of reaction. Patient tolerated well.

## 2019-03-03 NOTE — Progress Notes (Signed)
HD initiated via L AVG using 16g needles after administration of iv pain medication per Dr. Candiss Norse. Patient currently resting comfortably. No current complaints. Type and screen sent. Patient to receive PRBCs with HD today as ordered.

## 2019-03-03 NOTE — Progress Notes (Signed)
MD notified: hemoglobin is 6.8 this morning would you like to order blood transfusion since he has just left to dialysis they indicate they can provide the unit of blood if ordered.

## 2019-03-03 NOTE — Progress Notes (Signed)
HD pre assessment. Complains of severe pain in L AVG from previous procedure. Dr. Candiss Norse aware.

## 2019-03-03 NOTE — TOC Transition Note (Signed)
Transition of Care [facility]) - CM/SW Discharge Note   Patient Details  Name: [name] MRN: [id number] Date of Birth: [birth year]  Transition of Care [facility]) CM/SW Contact:  Beverly Sessions, RN Phone Number: 03/03/2019, 4:21 PM   Clinical Narrative:    Patient to discharge back to boarding house today.  Patient utilizes [facility] to HD. Denies issues with transportation   [name] dialysis liaison notified of discharge  patient has new patient appointment scheduled for [facility] for 5/19     Final next level of care: Home/Self Care Barriers to Discharge: Barriers Resolved   Patient Goals and CMS Choice        Discharge Placement                       Discharge Plan and Services   Discharge Planning Services: CM Consult                                 Social Determinants of Health (SDOH) Interventions     Readmission Risk Interventions Readmission Risk Prevention Plan 03/03/2019 02/24/2019 02/09/2019  Transportation Screening Complete Complete -  Medication Review ([place]) Complete Complete -  PCP or Specialist appointment within 3-5 days of discharge - Complete Patient refused  PCP/Specialist Appt Not Complete comments - - patient to follow up at outpatient palliative.  Patient states he has not followed obtaining a PCP.  Patient declined for new appointment to be made  [place] or [place] (No Data) Complete (No Data)  SW Recovery Care/Counseling Consult - Complete -  Palliative Care Screening Not Applicable Not Applicable Not Applicable  Skilled Nursing Facility Not Applicable Not Applicable Not Applicable

## 2019-03-04 LAB — TYPE AND SCREEN
ABO/RH(D): AB POS
Antibody Screen: NEGATIVE
Unit division: 0

## 2019-03-04 LAB — BPAM RBC
Blood Product Expiration Date: 202005142359
ISSUE DATE / TIME: 202005121123
Unit Type and Rh: 6200

## 2019-03-05 LAB — CULTURE, BLOOD (ROUTINE X 2)
Culture: NO GROWTH
Culture: NO GROWTH

## 2019-05-19 ENCOUNTER — Emergency Department: Payer: Medicare Other

## 2019-05-19 ENCOUNTER — Encounter: Payer: Self-pay | Admitting: Anesthesiology

## 2019-05-19 ENCOUNTER — Inpatient Hospital Stay
Admission: EM | Admit: 2019-05-19 | Discharge: 2019-05-22 | DRG: 441 | Disposition: A | Discharge status: Other Acute Inpatient Hospital | Payer: Medicare Other | Attending: Internal Medicine | Admitting: Internal Medicine

## 2019-05-19 ENCOUNTER — Encounter
Admission: EM | Disposition: A | Discharge status: Other Acute Inpatient Hospital | Payer: Self-pay | Source: Home / Self Care | Attending: Internal Medicine

## 2019-05-19 ENCOUNTER — Inpatient Hospital Stay: Payer: Medicare Other

## 2019-05-19 ENCOUNTER — Other Ambulatory Visit: Payer: Self-pay

## 2019-05-19 DIAGNOSIS — K920 Hematemesis: Secondary | ICD-10-CM | POA: Diagnosis present

## 2019-05-19 DIAGNOSIS — Z992 Dependence on renal dialysis: Secondary | ICD-10-CM

## 2019-05-19 DIAGNOSIS — I739 Peripheral vascular disease, unspecified: Secondary | ICD-10-CM | POA: Diagnosis present

## 2019-05-19 DIAGNOSIS — D689 Coagulation defect, unspecified: Secondary | ICD-10-CM

## 2019-05-19 DIAGNOSIS — D696 Thrombocytopenia, unspecified: Secondary | ICD-10-CM

## 2019-05-19 DIAGNOSIS — I120 Hypertensive chronic kidney disease with stage 5 chronic kidney disease or end stage renal disease: Secondary | ICD-10-CM | POA: Diagnosis present

## 2019-05-19 DIAGNOSIS — F17200 Nicotine dependence, unspecified, uncomplicated: Secondary | ICD-10-CM | POA: Diagnosis present

## 2019-05-19 DIAGNOSIS — Z20828 Contact with and (suspected) exposure to other viral communicable diseases: Secondary | ICD-10-CM | POA: Diagnosis present

## 2019-05-19 DIAGNOSIS — R791 Abnormal coagulation profile: Secondary | ICD-10-CM | POA: Diagnosis present

## 2019-05-19 DIAGNOSIS — Z7901 Long term (current) use of anticoagulants: Secondary | ICD-10-CM

## 2019-05-19 DIAGNOSIS — Z79899 Other long term (current) drug therapy: Secondary | ICD-10-CM

## 2019-05-19 DIAGNOSIS — E860 Dehydration: Secondary | ICD-10-CM | POA: Diagnosis present

## 2019-05-19 DIAGNOSIS — N2581 Secondary hyperparathyroidism of renal origin: Secondary | ICD-10-CM | POA: Diagnosis present

## 2019-05-19 DIAGNOSIS — D631 Anemia in chronic kidney disease: Secondary | ICD-10-CM | POA: Diagnosis present

## 2019-05-19 DIAGNOSIS — Z7982 Long term (current) use of aspirin: Secondary | ICD-10-CM | POA: Diagnosis not present

## 2019-05-19 DIAGNOSIS — I820 Budd-Chiari syndrome: Secondary | ICD-10-CM

## 2019-05-19 DIAGNOSIS — K759 Inflammatory liver disease, unspecified: Secondary | ICD-10-CM

## 2019-05-19 DIAGNOSIS — K7200 Acute and subacute hepatic failure without coma: Secondary | ICD-10-CM | POA: Diagnosis present

## 2019-05-19 DIAGNOSIS — K7290 Hepatic failure, unspecified without coma: Secondary | ICD-10-CM

## 2019-05-19 DIAGNOSIS — K828 Other specified diseases of gallbladder: Secondary | ICD-10-CM | POA: Diagnosis present

## 2019-05-19 DIAGNOSIS — N186 End stage renal disease: Secondary | ICD-10-CM | POA: Diagnosis not present

## 2019-05-19 DIAGNOSIS — I10 Essential (primary) hypertension: Secondary | ICD-10-CM | POA: Diagnosis present

## 2019-05-19 LAB — CBC
HCT: 36.7 % — ABNORMAL LOW (ref 39.0–52.0)
Hemoglobin: 11.9 g/dL — ABNORMAL LOW (ref 13.0–17.0)
MCH: 32.2 pg (ref 26.0–34.0)
MCHC: 32.4 g/dL (ref 30.0–36.0)
MCV: 99.5 fL (ref 80.0–100.0)
Platelets: 28 10*3/uL — CL (ref 150–400)
RBC: 3.69 MIL/uL — ABNORMAL LOW (ref 4.22–5.81)
RDW: 21.8 % — ABNORMAL HIGH (ref 11.5–15.5)
WBC: 12 10*3/uL — ABNORMAL HIGH (ref 4.0–10.5)
nRBC: 0 % (ref 0.0–0.2)

## 2019-05-19 LAB — COMPREHENSIVE METABOLIC PANEL
ALT: 679 U/L — ABNORMAL HIGH (ref 0–44)
ALT: 607 U/L — ABNORMAL HIGH (ref 0–44)
AST: 379 U/L — ABNORMAL HIGH (ref 15–41)
AST: 380 U/L — ABNORMAL HIGH (ref 15–41)
Albumin: 3.1 g/dL — ABNORMAL LOW (ref 3.5–5.0)
Albumin: 2.7 g/dL — ABNORMAL LOW (ref 3.5–5.0)
Alkaline Phosphatase: 95 U/L (ref 38–126)
Alkaline Phosphatase: 87 U/L (ref 38–126)
Anion gap: 31 — ABNORMAL HIGH (ref 5–15)
Anion gap: 26 — ABNORMAL HIGH (ref 5–15)
BUN: 87 mg/dL — ABNORMAL HIGH (ref 6–20)
BUN: 98 mg/dL — ABNORMAL HIGH (ref 6–20)
CO2: 13 mmol/L — ABNORMAL LOW (ref 22–32)
CO2: 17 mmol/L — ABNORMAL LOW (ref 22–32)
Calcium: 8.2 mg/dL — ABNORMAL LOW (ref 8.9–10.3)
Calcium: 7.8 mg/dL — ABNORMAL LOW (ref 8.9–10.3)
Chloride: 88 mmol/L — ABNORMAL LOW (ref 98–111)
Chloride: 90 mmol/L — ABNORMAL LOW (ref 98–111)
Creatinine, Ser: 7.01 mg/dL — ABNORMAL HIGH (ref 0.61–1.24)
Creatinine, Ser: 7.32 mg/dL — ABNORMAL HIGH (ref 0.61–1.24)
GFR calc Af Amer: 10 mL/min — ABNORMAL LOW (ref >60)
GFR calc Af Amer: 9 mL/min — ABNORMAL LOW (ref >60)
GFR calc non Af Amer: 9 mL/min — ABNORMAL LOW (ref >60)
GFR calc non Af Amer: 8 mL/min — ABNORMAL LOW (ref >60)
Glucose, Bld: 99 mg/dL (ref 70–99)
Glucose, Bld: 114 mg/dL — ABNORMAL HIGH (ref 70–99)
Potassium: 4.6 mmol/L (ref 3.5–5.1)
Potassium: 4.8 mmol/L (ref 3.5–5.1)
Sodium: 132 mmol/L — ABNORMAL LOW (ref 135–145)
Sodium: 133 mmol/L — ABNORMAL LOW (ref 135–145)
Total Bilirubin: 14 mg/dL — ABNORMAL HIGH (ref 0.3–1.2)
Total Bilirubin: 12.7 mg/dL — ABNORMAL HIGH (ref 0.3–1.2)
Total Protein: 5.8 g/dL — ABNORMAL LOW (ref 6.5–8.1)
Total Protein: 5.2 g/dL — ABNORMAL LOW (ref 6.5–8.1)

## 2019-05-19 LAB — CBC WITH DIFFERENTIAL/PLATELET
Abs Immature Granulocytes: 0.08 10*3/uL — ABNORMAL HIGH (ref 0.00–0.07)
Basophils Absolute: 0 10*3/uL (ref 0.0–0.1)
Basophils Relative: 0 %
Eosinophils Absolute: 0 10*3/uL (ref 0.0–0.5)
Eosinophils Relative: 0 %
HCT: 31.1 % — ABNORMAL LOW (ref 39.0–52.0)
Hemoglobin: 10.3 g/dL — ABNORMAL LOW (ref 13.0–17.0)
Immature Granulocytes: 1 %
Lymphocytes Relative: 10 %
Lymphs Abs: 0.8 10*3/uL (ref 0.7–4.0)
MCH: 32.6 pg (ref 26.0–34.0)
MCHC: 33.1 g/dL (ref 30.0–36.0)
MCV: 98.4 fL (ref 80.0–100.0)
Monocytes Absolute: 0.5 10*3/uL (ref 0.1–1.0)
Monocytes Relative: 5 %
Neutro Abs: 7.4 10*3/uL (ref 1.7–7.7)
Neutrophils Relative %: 84 %
Platelets: 24 10*3/uL — CL (ref 150–400)
RBC: 3.16 MIL/uL — ABNORMAL LOW (ref 4.22–5.81)
RDW: 21.7 % — ABNORMAL HIGH (ref 11.5–15.5)
WBC: 8.8 10*3/uL (ref 4.0–10.5)
nRBC: 0 % (ref 0.0–0.2)

## 2019-05-19 LAB — LACTIC ACID, PLASMA
Lactic Acid, Venous: 5.3 mmol/L (ref 0.5–1.9)
Lactic Acid, Venous: 3.4 mmol/L (ref 0.5–1.9)

## 2019-05-19 LAB — APTT: aPTT: 42 seconds — ABNORMAL HIGH (ref 24–36)

## 2019-05-19 LAB — IMMATURE PLATELET FRACTION: Immature Platelet Fraction: 17.9 % — ABNORMAL HIGH (ref 1.2–8.6)

## 2019-05-19 LAB — PATHOLOGIST SMEAR REVIEW

## 2019-05-19 LAB — MRSA PCR SCREENING: MRSA by PCR: NEGATIVE

## 2019-05-19 LAB — BILIRUBIN, DIRECT: Bilirubin, Direct: 7.3 mg/dL — ABNORMAL HIGH (ref 0.0–0.2)

## 2019-05-19 LAB — TYPE AND SCREEN
ABO/RH(D): AB POS
Antibody Screen: NEGATIVE

## 2019-05-19 LAB — PROTIME-INR
INR: 4.9 (ref 0.8–1.2)
Prothrombin Time: 44.9 seconds — ABNORMAL HIGH (ref 11.4–15.2)

## 2019-05-19 LAB — SARS CORONAVIRUS 2 BY RT PCR (HOSPITAL ORDER, PERFORMED IN ~~LOC~~ HOSPITAL LAB): SARS Coronavirus 2: NEGATIVE

## 2019-05-19 LAB — LIPASE, BLOOD: Lipase: 79 U/L — ABNORMAL HIGH (ref 11–51)

## 2019-05-19 LAB — ACETAMINOPHEN LEVEL: Acetaminophen (Tylenol), Serum: <10 ug/mL — ABNORMAL LOW (ref 10–30)

## 2019-05-19 LAB — TECHNOLOGIST SMEAR REVIEW

## 2019-05-19 LAB — LACTATE DEHYDROGENASE: LDH: 584 U/L — ABNORMAL HIGH (ref 98–192)

## 2019-05-19 SURGERY — EGD (ESOPHAGOGASTRODUODENOSCOPY)
Anesthesia: General

## 2019-05-19 MED ORDER — SODIUM CHLORIDE 0.9 % IV SOLN
2.0000 g | INTRAVENOUS | Status: DC
  Administered 2019-05-20: 05:00:00 2 g via INTRAVENOUS
  Filled 2019-05-19: qty 2
  Filled 2019-05-19: qty 20

## 2019-05-19 MED ORDER — ONDANSETRON HCL 4 MG/2ML IJ SOLN
4.0000 mg | Freq: Once | INTRAMUSCULAR | Status: AC
  Administered 2019-05-19: 4 mg via INTRAVENOUS
  Filled 2019-05-19: qty 2

## 2019-05-19 MED ORDER — SODIUM CHLORIDE 0.9 % IV SOLN
1.0000 g | Freq: Once | INTRAVENOUS | Status: AC
  Administered 2019-05-19: 1 g via INTRAVENOUS
  Filled 2019-05-19: qty 10

## 2019-05-19 MED ORDER — SODIUM CHLORIDE 0.9 % IV SOLN
2.0000 g | INTRAVENOUS | Status: DC
  Filled 2019-05-19: qty 20

## 2019-05-19 MED ORDER — SODIUM CHLORIDE 0.9 % IV SOLN
80.0000 mg | Freq: Once | INTRAVENOUS | Status: AC
  Administered 2019-05-19: 80 mg via INTRAVENOUS
  Filled 2019-05-19: qty 80

## 2019-05-19 MED ORDER — SODIUM CHLORIDE 0.9 % IV SOLN
8.0000 mg/h | INTRAVENOUS | Status: DC
  Administered 2019-05-19 – 2019-05-21 (×4): 8 mg/h via INTRAVENOUS
  Filled 2019-05-19 (×4): qty 80

## 2019-05-19 MED ORDER — CHLORHEXIDINE GLUCONATE CLOTH 2 % EX PADS: 6.0000 | MEDICATED_PAD | Freq: Every day | CUTANEOUS | Status: DC

## 2019-05-19 MED ORDER — ONDANSETRON HCL 4 MG/2ML IJ SOLN
4.0000 mg | Freq: Four times a day (QID) | INTRAMUSCULAR | Status: DC | PRN
  Administered 2019-05-19 – 2019-05-22 (×4): 4 mg via INTRAVENOUS
  Filled 2019-05-19 (×4): qty 2

## 2019-05-19 MED ORDER — PANTOPRAZOLE SODIUM 40 MG IV SOLR
40.0000 mg | Freq: Two times a day (BID) | INTRAVENOUS | Status: DC
  Administered 2019-05-22: 15:00:00 40 mg via INTRAVENOUS
  Filled 2019-05-19: qty 40

## 2019-05-19 MED ORDER — TRAMADOL HCL 50 MG PO TABS
50.0000 mg | ORAL_TABLET | Freq: Four times a day (QID) | ORAL | Status: DC | PRN
  Administered 2019-05-19 – 2019-05-21 (×3): 50 mg via ORAL
  Filled 2019-05-19 (×3): qty 1

## 2019-05-19 MED ORDER — OCTREOTIDE LOAD VIA INFUSION
50.0000 ug | Freq: Once | INTRAVENOUS | Status: AC
  Administered 2019-05-19: 50 ug via INTRAVENOUS
  Filled 2019-05-19: qty 25

## 2019-05-19 MED ORDER — VITAMIN K1 10 MG/ML IJ SOLN
10.0000 mg | Freq: Once | INTRAMUSCULAR | Status: AC
  Administered 2019-05-19: 10 mg via SUBCUTANEOUS
  Filled 2019-05-19: qty 1

## 2019-05-19 MED ORDER — SODIUM CHLORIDE 0.9 % IV SOLN
50.0000 ug/h | INTRAVENOUS | Status: DC
  Administered 2019-05-19 – 2019-05-21 (×5): 50 ug/h via INTRAVENOUS
  Filled 2019-05-19 (×13): qty 1

## 2019-05-19 MED ORDER — ONDANSETRON HCL 4 MG PO TABS: 4.0000 mg | ORAL_TABLET | Freq: Four times a day (QID) | ORAL | Status: DC | PRN

## 2019-05-19 MED ORDER — SODIUM CHLORIDE 0.9 % IV BOLUS: 500.0000 mL | Freq: Once | INTRAVENOUS | Status: DC

## 2019-05-19 MED ORDER — IOHEXOL 240 MG/ML SOLN
25.0000 mL | INTRAMUSCULAR | Status: AC
  Administered 2019-05-19: 25 mL via ORAL

## 2019-05-19 NOTE — Consult Note (Signed)
Hematology/Oncology Consult note [facility] [phone] [phone]  Patient Care Team: Patient, [name] as PCP - General (General Practice)   Name of the patient: Brett Small  [id number]  1975/11/20   Date of visit: 05/19/19 REASON FOR COSULTATION:  Thrombocytopenia,  History of presenting illness-  43 y.o. male with PMH listed at below including chronic kidney disease, hypertension, who presents to ER with a complaint of hematemesis.   Patient is drowsy and sleeping.  Cannot wake up to provide any history.   History was obtained from reviewing medical records in chart. Per ER note, patient reports having intermittent nausea and vomiting with coffee-ground emesis. ER lab work showed evidence of acute liver failure, with elevated transaminitis and a significantly elevated total bilirubin. Patient also had an elevated INR 4.9, elevated APTT 42. Acetaminophen level was not elevated, hemoglobin 10.3. Patient has acute thrombocytopenia with platelet count of 28,000, a significant drop from his baseline. Technician smear reported mixed RBC population with polychromasia rare schistocytes.  Burr cells.  Teardrop cells. I requested pathologist smear evaluation. Heme-onc was consulted for thrombocytopenia work-up.   Review of Systems  Unable to perform ROS: Mental status change  Patient is a lethargic.  Allergies  Allergen Reactions  . Codeine Nausea Only    Patient questioned this (??)  . Sulfa Antibiotics Hives and Nausea And Vomiting    Patient Active Problem List   Diagnosis Date Noted  . Acute liver failure 05/19/2019  . Hematemesis 05/19/2019  . Sepsis ([place]) 02/28/2019  . Lobar pneumonia ([place]) 02/28/2019  . Acute respiratory failure ([place]) 02/04/2019  . Acute respiratory failure with hypoxemia ([place]) 01/27/2019  . Depression 01/07/2019  . MDD (major depressive disorder), single episode, severe , no psychosis ([place])   .  Homelessness   . Acute respiratory failure with hypoxia ([place]) 12/25/2018  . End stage renal disease on dialysis ([place]) 12/25/2018  . Hypertension 12/25/2018  . Renal osteodystrophy 12/25/2018     Past Medical History:  Diagnosis Date  . Hypertension   . Renal disorder      Past Surgical History:  Procedure Laterality Date  . A/V SHUNT INTERVENTION Left 01/19/2019   Procedure: LEFT UPPER EXTREMITY A/V SHUNTOGRAM / UPPER EXTREMITY ANGIOGRAM;  Surgeon: Algernon Huxley, MD;  Location: [place] [facility];  Service: Cardiovascular;  Laterality: Left;  . A/V SHUNT INTERVENTION Left 03/02/2019   Procedure: A/V SHUNT INTERVENTION;  Surgeon: Algernon Huxley, MD;  Location: [place] [facility];  Service: Cardiovascular;  Laterality: Left;  . AORTA - FEMORAL ARTERY BYPASS GRAFT    . AV FISTULA PLACEMENT Left 12/26/2018   Procedure: INSERTION OF GORE STRETCH VASCULAR 4-7MM X  45CM IN LEFT UPPER ARM;  Surgeon: Marty Heck, MD;  Location: [place];  Service: Vascular;  Laterality: Left;  . DIALYSIS/PERMA CATHETER REMOVAL N/A 02/06/2019   Procedure: DIALYSIS/PERMA CATHETER REMOVAL;  Surgeon: Algernon Huxley, MD;  Location: [place] [facility];  Service: Cardiovascular;  Laterality: N/A;    Social History   Socioeconomic History  . Marital status: Single    Spouse name: Not on file  . Number of children: Not on file  . Years of education: Not on file  . Highest education level: Not on file  Occupational History  . Not on file  Social Needs  . Financial resource strain: Not on file  . Food insecurity    Worry: Not on file    Inability: Not on file  .  Transportation needs    Medical: Not on file    Non-medical: Not on file  Tobacco Use  . Smoking status: Current Every Day Smoker    Last attempt to quit: 06/26/2018    Years since quitting: 0.8  . Smokeless tobacco: Never Used  . Tobacco comment: smoked for 30 years   Substance and Sexual Activity  . Alcohol use: Not Currently  . Drug use:  Never  . Sexual activity: Not on file  Lifestyle  . Physical activity    Days per week: Not on file    Minutes per session: Not on file  . Stress: Not on file  Relationships  . Social Herbalist on phone: Not on file    Gets together: Not on file    Attends religious service: Not on file    Active member of club or organization: Not on file    Attends meetings of clubs or organizations: Not on file    Relationship status: Not on file  . Intimate partner violence    Fear of current or ex partner: Patient refused    Emotionally abused: Patient refused    Physically abused: Patient refused    Forced sexual activity: Patient refused  Other Topics Concern  . Not on file  Social History Narrative  . Not on file     Family History  Problem Relation Age of Onset  . Hypertension Other   . Diabetes Other   . Clotting disorder Father      Current Facility-Administered Medications:  .  [START ON 05/20/2019] cefTRIAXone (ROCEPHIN) 2 g in sodium chloride 0.9 % 100 mL IVPB, 2 g, Intravenous, Q24H, Lance Coon, MD .  Chlorhexidine Gluconate Cloth 2 % PADS 6 each, 6 each, Topical, Q0600, Kolluru, Sarath, MD .  [COMPLETED] octreotide (SANDOSTATIN) 2 mcg/mL load via infusion 50 mcg, 50 mcg, Intravenous, Once, 50 mcg at 05/19/19 0439 **AND** octreotide (SANDOSTATIN) 500 mcg in sodium chloride 0.9 % 250 mL (2 mcg/mL) infusion, 50 mcg/hr, Intravenous, Continuous, Lance Coon, MD, Last Rate: 25 mL/hr at 05/19/19 0439, 50 mcg/hr at 05/19/19 0439 .  ondansetron (ZOFRAN) tablet 4 mg, 4 mg, Oral, Q6H PRN **OR** ondansetron (ZOFRAN) injection 4 mg, 4 mg, Intravenous, Q6H PRN, Lance Coon, MD, 4 mg at 05/19/19 0908 .  pantoprazole (PROTONIX) 80 mg in sodium chloride 0.9 % 250 mL (0.32 mg/mL) infusion, 8 mg/hr, Intravenous, Continuous, Lance Coon, MD, Last Rate: 25 mL/hr at 05/19/19 0331, 8 mg/hr at 05/19/19 0331 .  [START ON 05/22/2019] pantoprazole (PROTONIX) injection 40 mg, 40 mg,  Intravenous, Q12H, Lance Coon, MD .  sodium chloride 0.9 % bolus 500 mL, 500 mL, Intravenous, Once, Hillary Bow, MD   Physical exam: ECOG  Vitals:   05/19/19 0151 05/19/19 0545 05/19/19 0616 05/19/19 1303  BP:  131/88 93/60 (!) 111/59  Pulse:  89 86 81  Resp:  16 20 16   Temp:   97.6 F (36.4 C) (!) 97.4 F (36.3 C)  TempSrc:   Oral Oral  SpO2:  98% 97% 99%  Weight: 100 lb (45.4 kg)  104 lb 15 oz (47.6 kg)   Height: 1' (0.305 m)  5\' 1"  (1.549 m)    Physical Exam  HENT:  Head: Normocephalic and atraumatic.  Eyes: Scleral icterus is present.  Neck: Neck supple.  Cardiovascular: Normal rate.  No murmur heard. Pulmonary/Chest: Effort normal and breath sounds normal.  Abdominal: Soft.  Neurological:  Drowsy not cooperating with physical examination.  Skin: Skin  is warm.        CMP Latest Ref Rng & Units 05/19/2019  Glucose 70 - 99 mg/dL 114(H)  BUN 6 - 20 mg/dL 98(H)  Creatinine 0.61 - 1.24 mg/dL 7.32(H)  Sodium 135 - 145 mmol/L 133(L)  Potassium 3.5 - 5.1 mmol/L 4.8  Chloride 98 - 111 mmol/L 90(L)  CO2 22 - 32 mmol/L 17(L)  Calcium 8.9 - 10.3 mg/dL 7.8(L)  Total Protein 6.5 - 8.1 g/dL 5.2(L)  Total Bilirubin 0.3 - 1.2 mg/dL 12.7(H)  Alkaline Phos 38 - 126 U/L 87  AST 15 - 41 U/L 380(H)  ALT 0 - 44 U/L 607(H)   CBC Latest Ref Rng & Units 05/19/2019  WBC 4.0 - 10.5 K/uL 8.8  Hemoglobin 13.0 - 17.0 g/dL 10.3(L)  Hematocrit 39.0 - 52.0 % 31.1(L)  Platelets 150 - 400 K/uL 24(LL)   RADIOGRAPHIC STUDIES: I have personally reviewed the radiological images as listed and agreed with the findings in the report.  Ct Abdomen Pelvis Wo Contrast  Result Date: 05/19/2019 CLINICAL DATA:  Liver failure, hematemesis EXAM: CT ABDOMEN AND PELVIS WITHOUT CONTRAST TECHNIQUE: Multidetector CT imaging of the abdomen and pelvis was performed following the standard protocol without IV contrast. Oral enteric contrast was administered. COMPARISON:  None. FINDINGS: Lower chest: Small  pericardial effusion. Hepatobiliary: No solid liver abnormality is seen. No gallstones, gallbladder wall thickening, or biliary dilatation. Pancreas: Unremarkable. No pancreatic ductal dilatation or surrounding inflammatory changes. Spleen: Normal in size without significant abnormality. Adrenals/Urinary Tract: Adrenal glands are unremarkable. Atrophic kidneys. Bladder is unremarkable. Stomach/Bowel: Stomach is within normal limits. Appendix appears normal. No evidence of bowel wall thickening, distention, or inflammatory changes. Vascular/Lymphatic: There is complex multi component aortobifemoral graft bypass. No enlarged abdominal or pelvic lymph nodes. Reproductive: No mass or other significant abnormality. Other: No abdominal wall hernia or abnormality. Small volume ascites. Musculoskeletal: No acute or significant osseous findings. IMPRESSION: 1.  Small volume ascites. 2.  Aortobifemoral graft bypass. 3.  Atrophic kidneys. 4.  Pericardial effusion. Electronically Signed   By: Eddie Candle M.D.   On: 05/19/2019 12:30   Dg Abdomen Acute W/chest  Result Date: 05/19/2019 CLINICAL DATA:  Hematemesis, epigastric abdominal pain EXAM: DG ABDOMEN ACUTE W/ 1V CHEST COMPARISON:  Chest radiographs dated 03/02/2019 FINDINGS: Lungs are clear.  No pleural effusion or pneumothorax. The heart is normal in size.  Left axillary vascular stent. Nonobstructive bowel gas pattern. No evidence of free air under the diaphragm on the upright view. Vascular stents overlying the upper abdomen. Additional right common iliac artery stent. Surgical clips overlying the left lower abdomen. Visualized osseous structures are within normal limits. IMPRESSION: No evidence of acute cardiopulmonary disease. No evidence of small bowel obstruction or free air. Electronically Signed   By: Julian Hy M.D.   On: 05/19/2019 03:03   US Abdomen Limited Ruq  Result Date: 05/19/2019 CLINICAL DATA:  Hepatitis. EXAM: ULTRASOUND ABDOMEN LIMITED  RIGHT UPPER QUADRANT COMPARISON:  None. FINDINGS: Gallbladder: Sludge within the partially distended gallbladder. No wall thickening or focal tenderness. Common bile duct: Diameter: 5 mm.  Where visualized, no filling defect. Liver: Echogenic liver with small volume ascites. No evident mass. Portal vein is patent on color Doppler imaging with normal direction of blood flow towards the liver. Marked right renal atrophy. IMPRESSION: 1. Gallbladder sludge. 2. Hepatocellular disease. 3. Small ascites. 4. Marked right renal atrophy. Electronically Signed   By: Monte Fantasia M.D.   On: 05/19/2019 04:45    Assessment and plan- Patient  is a 44 y.o. male with a history of chronic kidney disease, presented for evaluation of nausea vomiting of coffee-ground emesis.  #Acute liver failure, coagulopathy Hepatitis panel is pending.  Acetaminophen level <10 Appreciate GI evaluation.  #Thrombocytopenia, I discussed pathologist smear findings with the pathologist Dr. Rodney Cruise over the phone. Patient smear shows mixed RBC population including burr cells, polychromasia, target cells teardrop cells.  There is rare RBC fragments, confirmed with pathology that there is no significant RBC fragmentation. LDH is elevated in the 500s, most likely secondary to organ injury due to liver failure. Haptoglobin is pending.  Possibly low due to acute liver failure Immature platelet fraction is elevated,  Clinically, thrombocytopenia most likely secondary to consumption or peripheral destruction.  No overt schistocytosis. He also has coagulopathy which fits clinical picture of liver failure, less likely TTP. Per Dr. Darvin Neighbours, patient is planned for EGD evaluation for hematemesis, requires platelet counts more than 50,000.  Okay to transfuse to facilitate GI work-up.  He has also coagulopathy with increased PT and APTT. Consider FFP transfusion prior to procedure.  Patient is very sick, recommend transferring to tertiary center for  further work-up and evaluation of liver transplant.    Thank you for allowing me to participate in the care of this patient.  Total face to face encounter time for this patient visit was 70 min. >50% of the time was  spent in counseling and coordination of care.    Earlie Server, MD, PhD Hematology Oncology [facility] at [facility] Pager- [phone] 05/19/2019

## 2019-05-19 NOTE — Progress Notes (Signed)
[place] [facility] [place] NAME: Brett Small    MR#:  [id number]  DATE OF BIRTH:  11-12-1975  SUBJECTIVE:  CHIEF COMPLAINT:   Chief Complaint  Patient presents with  . Hematemesis   No further vomiting.  REVIEW OF SYSTEMS:    Review of Systems  Constitutional: Positive for malaise/fatigue. Negative for chills and fever.  HENT: Negative for sore throat.   Eyes: Negative for blurred vision, double vision and pain.  Respiratory: Negative for cough, hemoptysis, shortness of breath and wheezing.   Cardiovascular: Negative for chest pain, palpitations, orthopnea and leg swelling.  Gastrointestinal: Negative for abdominal pain, constipation, diarrhea, heartburn, nausea and vomiting.  Genitourinary: Negative for dysuria and hematuria.  Musculoskeletal: Negative for back pain and joint pain.  Skin: Negative for rash.  Neurological: Negative for sensory change, speech change, focal weakness and headaches.  Endo/Heme/Allergies: Does not bruise/bleed easily.  Psychiatric/Behavioral: Negative for depression. The patient is not nervous/anxious.     DRUG ALLERGIES:   Allergies  Allergen Reactions  . Codeine Nausea Only    Patient questioned this (??)  . Sulfa Antibiotics Hives and Nausea And Vomiting    VITALS:  Blood pressure (!) 111/59, pulse 81, temperature (!) 97.4 F (36.3 C), temperature source Oral, resp. rate 16, height 5\' 1"  (1.549 m), weight 47.6 kg, SpO2 99 %.  PHYSICAL EXAMINATION:   Physical Exam  GENERAL:  43 y.o.-year-old patient lying in the bed with no acute distress.  EYES: Pupils equal, round, reactive to light and accommodation. No scleral icterus. Extraocular muscles intact.  HEENT: Head atraumatic, normocephalic. Oropharynx and nasopharynx clear.  NECK:  Supple, no jugular venous distention. No thyroid enlargement, no tenderness.  LUNGS: Normal breath sounds bilaterally, no wheezing, rales, rhonchi. No use of accessory  muscles of respiration.  CARDIOVASCULAR: S1, S2 normal. No murmurs, rubs, or gallops.  ABDOMEN: Soft, nontender, nondistended. Bowel sounds present. No organomegaly or mass.  EXTREMITIES: No cyanosis, clubbing or edema b/l.    NEUROLOGIC: Cranial nerves II through XII are intact. No focal Motor or sensory deficits b/l.   PSYCHIATRIC: The patient is alert and oriented x 3.  SKIN: No obvious rash, lesion, or ulcer.   LABORATORY PANEL:   CBC Recent Labs  Lab 05/19/19 0708  WBC 8.8  HGB 10.3*  HCT 31.1*  PLT 24*   ------------------------------------------------------------------------------------------------------------------ Chemistries  Recent Labs  Lab 05/19/19 0708  NA 133*  K 4.8  CL 90*  CO2 17*  GLUCOSE 114*  BUN 98*  CREATININE 7.32*  CALCIUM 7.8*  AST 380*  ALT 607*  ALKPHOS 87  BILITOT 12.7*   ------------------------------------------------------------------------------------------------------------------  Cardiac Enzymes No results for input(s): TROPONINI in the last 168 hours. ------------------------------------------------------------------------------------------------------------------  RADIOLOGY:  Ct Abdomen Pelvis Wo Contrast  Result Date: 05/19/2019 CLINICAL DATA:  Liver failure, hematemesis EXAM: CT ABDOMEN AND PELVIS WITHOUT CONTRAST TECHNIQUE: Multidetector CT imaging of the abdomen and pelvis was performed following the standard protocol without IV contrast. Oral enteric contrast was administered. COMPARISON:  None. FINDINGS: Lower chest: Small pericardial effusion. Hepatobiliary: No solid liver abnormality is seen. No gallstones, gallbladder wall thickening, or biliary dilatation. Pancreas: Unremarkable. No pancreatic ductal dilatation or surrounding inflammatory changes. Spleen: Normal in size without significant abnormality. Adrenals/Urinary Tract: Adrenal glands are unremarkable. Atrophic kidneys. Bladder is unremarkable. Stomach/Bowel: Stomach  is within normal limits. Appendix appears normal. No evidence of bowel wall thickening, distention, or inflammatory changes. Vascular/Lymphatic: There is complex multi component aortobifemoral graft bypass. No enlarged abdominal  or pelvic lymph nodes. Reproductive: No mass or other significant abnormality. Other: No abdominal wall hernia or abnormality. Small volume ascites. Musculoskeletal: No acute or significant osseous findings. IMPRESSION: 1.  Small volume ascites. 2.  Aortobifemoral graft bypass. 3.  Atrophic kidneys. 4.  Pericardial effusion. Electronically Signed   By: Eddie Candle M.D.   On: 05/19/2019 12:30   Dg Abdomen Acute W/chest  Result Date: 05/19/2019 CLINICAL DATA:  Hematemesis, epigastric abdominal pain EXAM: DG ABDOMEN ACUTE W/ 1V CHEST COMPARISON:  Chest radiographs dated 03/02/2019 FINDINGS: Lungs are clear.  No pleural effusion or pneumothorax. The heart is normal in size.  Left axillary vascular stent. Nonobstructive bowel gas pattern. No evidence of free air under the diaphragm on the upright view. Vascular stents overlying the upper abdomen. Additional right common iliac artery stent. Surgical clips overlying the left lower abdomen. Visualized osseous structures are within normal limits. IMPRESSION: No evidence of acute cardiopulmonary disease. No evidence of small bowel obstruction or free air. Electronically Signed   By: Julian Hy M.D.   On: 05/19/2019 03:03   US Abdomen Limited Ruq  Result Date: 05/19/2019 CLINICAL DATA:  Hepatitis. EXAM: ULTRASOUND ABDOMEN LIMITED RIGHT UPPER QUADRANT COMPARISON:  None. FINDINGS: Gallbladder: Sludge within the partially distended gallbladder. No wall thickening or focal tenderness. Common bile duct: Diameter: 5 mm.  Where visualized, no filling defect. Liver: Echogenic liver with small volume ascites. No evident mass. Portal vein is patent on color Doppler imaging with normal direction of blood flow towards the liver. Marked right renal  atrophy. IMPRESSION: 1. Gallbladder sludge. 2. Hepatocellular disease. 3. Small ascites. 4. Marked right renal atrophy. Electronically Signed   By: Monte Fantasia M.D.   On: 05/19/2019 04:45     ASSESSMENT AND PLAN:    * Acute liver failure -unclear etiology upfront.  Acute hepatitis panel pending.  Patient was HIV Negative on 12/2018.  Patient has gallbladder sludge on ultrasound, but no significant pathology within the common bile duct.  Tylenol <10.  Patient does have an elevated INR, and thrombocytopenia. GI consulted Vitamin K given   * Hematemesis -likely related to his coagulopathy related to his liver failure.   No active bleeding seen at this time. On Protonix, octreotide drip.  IV ceftriaxone daily  * Thrombocytopenia Will transfuse platelets prior to EGD or if <20k No Schistocytes Hem-onc on board  *  End stage renal disease on dialysis [facility]) -nephrology consult for dialysis support  *  Hypertension -continue home meds   Discussed with GI, hematology oncology.  Anesthesia requesting transfer to tertiary care center with hepatology support.  Called [facility] initially.  Discussed at length and refused transfer at this time as patient not actively bleeding and needs further work-up for diagnosis.  I followed up by calling [facility] who have accepted the patient.  Discussed with  Dr. Agapito Games.  Graciously accepted as patient needs pathology input.  All the records are reviewed and case discussed with Care Management/Social Worker Management plans discussed with the patient, family and they are in agreement.  CODE STATUS: FULL CODE  DVT Prophylaxis: SCDs  TOTAL CRITICAL CARE TIME TAKING CARE OF THIS PATIENT: 80 minutes.   POSSIBLE D/C IN 2-3 DAYS, DEPENDING ON CLINICAL CONDITION.  Leia Alf Raynelle Fujikawa M.D on 05/19/2019 at 3:11 PM  Between 7am to 6pm - Pager - [phone]  After 6pm go to [email or web address] - password [email or web address] [place] [facility]   Office  [phone]  CC: Primary care physician; Patient,  [name]  Note: This dictation was prepared with Dragon dictation along with smaller phrase technology. Any transcriptional errors that result from this process are unintentional.

## 2019-05-19 NOTE — ED Provider Notes (Signed)
[facility] Emergency Department Provider Note  ____________________________________________  Time seen: Approximately 2:15 AM  I have reviewed the triage vital signs and the nursing notes.   HISTORY  Chief Complaint Hematemesis   HPI Brett Small is a 43 y.o. male with a history of ESRD on HD (TTS), hypertension who presents for evaluation of vomiting and diarrhea.  Patient reports that he has had nausea, vomiting, and diarrhea for the last week.  Has been unable to keep anything down.  Last dialysis was yesterday.  He has been feeling extremely dehydrated and weak.  He is also complaining of severe burning epigastric pain.  He does have a history of reflux.  He reports 3 episodes of hematemesis throughout the week with the last one being here in the waiting room.  He reports that most of his episodes of emesis are nonbloody and nonbilious.  No melena or hematochezia.  He denies alcohol use, Tylenol use, NSAID use, history of liver disease, history of alcohol abuse.  He denies chest pain or shortness of breath.  He denies fever.  He is supposed to be on Eliquis however he reports not taking it for over a week for a dental procedure that happened yesterday.  He reports having 7 teeth removed yesterday.   Past Medical History:  Diagnosis Date   Hypertension    Renal disorder     Patient Active Problem List   Diagnosis Date Noted   Sepsis ([place]) 02/28/2019   Lobar pneumonia ([place]) 02/28/2019   Acute respiratory failure ([place]) 02/04/2019   Acute respiratory failure with hypoxemia ([place]) 01/27/2019   Depression 01/07/2019   MDD (major depressive disorder), single episode, severe , no psychosis ([place])    Homelessness    Acute respiratory failure with hypoxia ([place]) 12/25/2018   End stage renal disease ([place]) 12/25/2018   Hypertension 12/25/2018   Renal osteodystrophy 12/25/2018    Past Surgical History:  Procedure Laterality Date   A/V SHUNT  INTERVENTION Left 01/19/2019   Procedure: LEFT UPPER EXTREMITY A/V SHUNTOGRAM / UPPER EXTREMITY ANGIOGRAM;  Surgeon: Algernon Huxley, MD;  Location: [place] [facility];  Service: Cardiovascular;  Laterality: Left;   A/V SHUNT INTERVENTION Left 03/02/2019   Procedure: A/V SHUNT INTERVENTION;  Surgeon: Algernon Huxley, MD;  Location: [place] [facility];  Service: Cardiovascular;  Laterality: Left;   AORTA - FEMORAL ARTERY BYPASS GRAFT     AV FISTULA PLACEMENT Left 12/26/2018   Procedure: INSERTION OF GORE STRETCH VASCULAR 4-7MM X  45CM IN LEFT UPPER ARM;  Surgeon: Marty Heck, MD;  Location: [facility];  Service: Vascular;  Laterality: Left;   DIALYSIS/PERMA CATHETER REMOVAL N/A 02/06/2019   Procedure: DIALYSIS/PERMA CATHETER REMOVAL;  Surgeon: Algernon Huxley, MD;  Location: [facility];  Service: Cardiovascular;  Laterality: N/A;    Prior to Admission medications   Medication Sig Start Date End Date Taking? Authorizing Provider  amLODipine (NORVASC) 10 MG tablet Take 1 tablet (10 mg total) by mouth daily at 8 pm. 02/24/19   Gladstone Lighter, MD  apixaban (ELIQUIS) 2.5 MG TABS tablet Take 1 tablet (2.5 mg total) by mouth 2 (two) times daily. 01/26/19   Clapacs, Madie Reno, MD  aspirin EC 81 MG tablet Take 1 tablet (81 mg total) by mouth daily. 01/26/19   Clapacs, Madie Reno, MD  atorvastatin (LIPITOR) 80 MG tablet Take 1 tablet (80 mg total) by mouth at bedtime. 01/26/19   Clapacs, Madie Reno, MD  carvedilol (COREG) 25  MG tablet Take 1 tablet (25 mg total) by mouth 2 (two) times daily with a meal. 01/26/19   Clapacs, Madie Reno, MD  cefdinir (OMNICEF) 300 MG capsule Take 1 capsule (300 mg total) by mouth 2 (two) times daily. 03/03/19   Salary, Holly Bodily D, MD  furosemide (LASIX) 80 MG tablet Take 1 tablet (80 mg total) by mouth every Monday,Wednesday,Friday, and Sunday at 6 PM. Non dialysis days 02/25/19 02/25/20  Gladstone Lighter, MD  gabapentin (NEURONTIN) 100 MG capsule Take 1 capsule (100 mg total) by mouth 3  (three) times daily. 01/26/19   Clapacs, Madie Reno, MD  hydrALAZINE (APRESOLINE) 25 MG tablet Take 1 tablet (25 mg total) by mouth every 8 (eight) hours. 01/26/19   Clapacs, Madie Reno, MD  hydrOXYzine (ATARAX/VISTARIL) 25 MG tablet Take 25 mg by mouth 3 (three) times daily as needed for anxiety.    [provider]  loratadine (CLARITIN) 10 MG tablet Take 1 tablet (10 mg total) by mouth daily. 03/04/19   Salary, Holly Bodily D, MD  multivitamin (RENA-VIT) TABS tablet Take 1 tablet by mouth daily. 01/27/19   Clapacs, Madie Reno, MD  neomycin-bacitracin-polymyxin (NEOSPORIN) OINT Apply 1 application topically 2 (two) times daily. 01/26/19   Clapacs, Madie Reno, MD  sevelamer carbonate (RENVELA) 800 MG tablet Take 2 tablets (1,600 mg total) by mouth 3 (three) times daily with meals. 01/26/19   Clapacs, Madie Reno, MD  traMADol (ULTRAM) 50 MG tablet Take 1 tablet (50 mg total) by mouth every 12 (twelve) hours as needed for moderate pain or severe pain. 03/03/19   Salary, Avel Peace, MD    Allergies Codeine and Sulfa antibiotics  Family History  Problem Relation Age of Onset   Hypertension Other    Diabetes Other    Clotting disorder Father     Social History Social History   Tobacco Use   Smoking status: Current Every Day Smoker    Last attempt to quit: 06/26/2018    Years since quitting: 0.8   Smokeless tobacco: Never Used   Tobacco comment: smoked for 30 years   Substance Use Topics   Alcohol use: Not Currently   Drug use: Never    Review of Systems  Constitutional: Negative for fever. Eyes: Negative for visual changes. ENT: Negative for sore throat. Neck: No neck pain  Cardiovascular: Negative for chest pain. Respiratory: Negative for shortness of breath. Gastrointestinal: + abdominal pain, vomiting and diarrhea. Genitourinary: Negative for dysuria. Musculoskeletal: Negative for back pain. Skin: Negative for rash. Neurological: Negative for headaches, weakness or numbness. Psych: No SI or  HI  ____________________________________________   PHYSICAL EXAM:  VITAL SIGNS: ED Triage Vitals  Enc Vitals Group     BP 05/19/19 0150 (!) 162/92     Pulse Rate 05/19/19 0150 98     Resp 05/19/19 0150 18     Temp 05/19/19 0150 97.7 F (36.5 C)     Temp src --      SpO2 05/19/19 0150 100 %     Weight 05/19/19 0151 100 lb (45.4 kg)     Height 05/19/19 0151 1' (0.305 m)     Head Circumference --      Peak Flow --      Pain Score 05/19/19 0151 7     Pain Loc --      Pain Edu? --      Excl. in [place]? --     Constitutional: Alert and oriented. Well appearing and in no apparent distress. HEENT:  Head: Normocephalic and atraumatic.         Eyes: Conjunctivae are normal. Sclera is icteric.       Mouth/Throat: Mucous membranes are moist.       Neck: Supple with no signs of meningismus. Cardiovascular: Regular rate and rhythm. No murmurs, gallops, or rubs. 2+ symmetrical distal pulses are present in all extremities. No JVD. Respiratory: Normal respiratory effort. Lungs are clear to auscultation bilaterally. No wheezes, crackles, or rhonchi.  Gastrointestinal: Soft, epigastric tenderness with no right upper quadrant tenderness, negative Murphy sign, and non distended with positive bowel sounds. No rebound or guarding.  Rectal exam showing brown stool guaiac negative. Musculoskeletal: Nontender with normal range of motion in all extremities. No edema, cyanosis, or erythema of extremities. Neurologic: Normal speech and language. Face is symmetric. Moving all extremities. No gross focal neurologic deficits are appreciated. Skin: Skin is warm, dry and intact. No rash noted. Jaundiced Psychiatric: Mood and affect are normal. Speech and behavior are normal.  ____________________________________________   LABS (all labs ordered are listed, but only abnormal results are displayed)  Labs Reviewed  COMPREHENSIVE METABOLIC PANEL - Abnormal; Notable for the following components:      Result  Value   Sodium 132 (*)    Chloride 88 (*)    CO2 13 (*)    BUN 87 (*)    Creatinine, Ser 7.01 (*)    Calcium 8.2 (*)    Total Protein 5.8 (*)    Albumin 3.1 (*)    AST 379 (*)    ALT 679 (*)    Total Bilirubin 14.0 (*)    GFR calc non Af Amer 9 (*)    GFR calc Af Amer 10 (*)    Anion gap 31 (*)    All other components within normal limits  CBC - Abnormal; Notable for the following components:   WBC 12.0 (*)    RBC 3.69 (*)    Hemoglobin 11.9 (*)    HCT 36.7 (*)    RDW 21.8 (*)    Platelets 28 (*)    All other components within normal limits  LIPASE, BLOOD - Abnormal; Notable for the following components:   Lipase 79 (*)    All other components within normal limits  PROTIME-INR - Abnormal; Notable for the following components:   Prothrombin Time 44.9 (*)    INR 4.9 (*)    All other components within normal limits  APTT - Abnormal; Notable for the following components:   aPTT 42 (*)    All other components within normal limits  ACETAMINOPHEN LEVEL  HEPATITIS PANEL, ACUTE  POC OCCULT BLOOD, ED  TYPE AND SCREEN  TYPE AND SCREEN   ____________________________________________  EKG  ED ECG REPORT I, Rudene Re, the attending physician, personally viewed and interpreted this ECG.  Normal sinus rhythm, rate of 97, prolonged QTC, normal axis, no ST elevations or depressions.  No significant changes when compared to prior. ____________________________________________  RADIOLOGY  I have personally reviewed the images performed during this visit and I agree with the Radiologist's read.   Interpretation by Radiologist:  Dg Abdomen Acute W/chest  Result Date: 05/19/2019 CLINICAL DATA:  Hematemesis, epigastric abdominal pain EXAM: DG ABDOMEN ACUTE W/ 1V CHEST COMPARISON:  Chest radiographs dated 03/02/2019 FINDINGS: Lungs are clear.  No pleural effusion or pneumothorax. The heart is normal in size.  Left axillary vascular stent. Nonobstructive bowel gas pattern. No  evidence of free air under the diaphragm on the upright view. Vascular stents  overlying the upper abdomen. Additional right common iliac artery stent. Surgical clips overlying the left lower abdomen. Visualized osseous structures are within normal limits. IMPRESSION: No evidence of acute cardiopulmonary disease. No evidence of small bowel obstruction or free air. Electronically Signed   By: Julian Hy M.D.   On: 05/19/2019 03:03   US Abdomen Limited Ruq  Result Date: 05/19/2019 CLINICAL DATA:  Hepatitis. EXAM: ULTRASOUND ABDOMEN LIMITED RIGHT UPPER QUADRANT COMPARISON:  None. FINDINGS: Gallbladder: Sludge within the partially distended gallbladder. No wall thickening or focal tenderness. Common bile duct: Diameter: 5 mm.  Where visualized, no filling defect. Liver: Echogenic liver with small volume ascites. No evident mass. Portal vein is patent on color Doppler imaging with normal direction of blood flow towards the liver. Marked right renal atrophy. IMPRESSION: 1. Gallbladder sludge. 2. Hepatocellular disease. 3. Small ascites. 4. Marked right renal atrophy. Electronically Signed   By: Monte Fantasia M.D.   On: 05/19/2019 04:45      ____________________________________________   PROCEDURES  Procedure(s) performed: None Procedures Critical Care performed: yes  CRITICAL CARE Performed by: Rudene Re  ?  Total critical care time: 45 min  Critical care time was exclusive of separately billable procedures and treating other patients.  Critical care was necessary to treat or prevent imminent or life-threatening deterioration.  Critical care was time spent personally by me on the following activities: development of treatment plan with patient and/or surrogate as well as nursing, discussions with consultants, evaluation of patient's response to treatment, examination of patient, obtaining history from patient or surrogate, ordering and performing treatments and interventions,  ordering and review of laboratory studies, ordering and review of radiographic studies, pulse oximetry and re-evaluation of patient's condition.  ____________________________________________   INITIAL IMPRESSION / ASSESSMENT AND PLAN / ED COURSE   43 y.o. male with a history of ESRD on HD (TTS), hypertension who presents for evaluation of vomiting, diarrhea, epigastric burning abdominal pain x 1 week and 3 episodes of hematemesis with the last one here in the waiting room.  Patient is hemodynamically stable, rectal exam showing brown stool back negative.  Abdomen is soft with tenderness palpation epigastric region, no rebound or guarding, negative Murphy sign.  Patient not currently on blood thinners.  Patient is jaundice and has scleral icterus.  He denies any history of cirrhosis of the liver or chronic alcohol abuse, or Tylenol abuse.  Will start patient on Protonix, GI cocktail, and zofran.  Will check labs, will send a type and cross, and coags. Will check CBC for indication for transfusion. ddx Mallory-Weiss tear, peptic ulcer disease, gastritis, varices, gastroenteritis.  Anticipate admission  Clinical Course as of May 19 503  Tue May 19, 2019  0344 Patient with transaminitis, T bili of 14, and thrombocytopenia of 28 concerning for cirrhosis of the liver.  Will start patient on octreotide bolus and drip for possible variceal bleed.  Will give a dose of Rocephin.  We will send patient for right upper quadrant ultrasound, get a hepatitis panel, Tylenol levels.   [CV]    Clinical Course User Index [CV] Alfred Levins, [place], MD    _________________________ 5:02 AM on 05/19/2019 -----------------------------------------  INR 4.5 consistent with acute liver failure.  Right upper quadrant ultrasound showing hepatocellular disease with cirrhosis of the liver.  Discussed with Dr. Marius Ditch from GI about admitting patient here or need for transfer.  Patient is not a transplant candidate and she  recommended admission here for stability sedation and endoscopy.  She agrees with Rocephin,  octreotide, and Protonix.  No indication for blood transfusion at this time.  She requested the patient be made n.p.o. for urgent endoscopy in the morning.  Discussed with Dr. Jannifer Franklin for admission.  As part of my medical decision making, I reviewed the following data within the [facility] notes reviewed and incorporated, Labs reviewed , EKG interpreted , Old EKG reviewed, Old chart reviewed, Radiograph reviewed , Discussed with admitting physician , A consult was requested and obtained from this/these consultant(s) GI, Notes from prior ED visits and [place] Controlled Substance Database   Patient was evaluated in Emergency Department today for the symptoms described in the history of present illness. Patient was evaluated in the context of the global COVID-19 pandemic, which necessitated consideration that the patient might be at risk for infection with the SARS-CoV-2 virus that causes COVID-19. Institutional protocols and algorithms that pertain to the evaluation of patients at risk for COVID-19 are in a state of rapid change based on information released by regulatory bodies including the CDC and federal and state organizations. These policies and algorithms were followed during the patient's care in the ED.   ____________________________________________   FINAL CLINICAL IMPRESSION(S) / ED DIAGNOSES   Final diagnoses:  Hepatitis  Acute liver failure without hepatic coma  Hematemesis with nausea      NEW MEDICATIONS STARTED DURING THIS VISIT:  ED Discharge Orders    None       Note:  This document was prepared using Dragon voice recognition software and may include unintentional dictation errors.    Alfred Levins, [place], MD 05/19/19 [phone]

## 2019-05-19 NOTE — Discharge Summary (Addendum)
[facility] NAME: Brett Small    MR#:  [id number]  DATE OF BIRTH:  [birth year]  DATE OF ADMISSION:  05/19/2019 ADMITTING PHYSICIAN: Lance Coon, MD  DATE OF DISCHARGE: 05/22/2019  PRIMARY CARE PHYSICIAN: Patient, [name]   ADMISSION DIAGNOSIS:  Hepatitis [K75.9] Hematemesis with nausea [K92.0] Acute liver failure without hepatic coma [K72.00]  DISCHARGE DIAGNOSIS:  Principal Problem:   Acute liver failure Active Problems:   End stage renal disease on dialysis ([place])   Hypertension   Hematemesis   Hepatitis   Thrombocytopenia ([place])   Coagulopathy ([place])   SECONDARY DIAGNOSIS:   Past Medical History:  Diagnosis Date  . Hypertension   . Renal disorder      ADMITTING HISTORY  HISTORY OF PRESENT ILLNESS:  Brett Small  is a 43 y.o. male who presents with chief complaint as above.  Patient presents to the ED with a complaint of hematemesis.  He states that for the past 2 weeks or so he has been having intermittent nausea with vomiting.  He denies frank abdominal pain.  He states that he has been having some heartburn, and then some vomiting.  He states initially this was a greenish emesis.  However, tonight he vomited up coffee-ground emesis.  He came to the ED for evaluation.  Here he is found to be in acute liver failure, with elevated transaminases and significantly elevated T bili.  He also has thrombocytopenia and an elevated INR.  This is compared to normal liver labs earlier this year.  Hospitalist were called for admission  HOSPITAL COURSE:   *Acute liver failure -unclear etiology .  Acute hepatitis panel pending. Patient was HIV Negative on3/2020. Patient has gallbladder sludge on ultrasound, but no significant pathology within the common bile duct.  Tylenol <10. Patient does have an elevated INR, and thrombocytopenia. GI consulted Vitamin K given Hepatitis A, B, C negative Liver Doppler showed no  thrombosis. Etiology remains unclear.  *Hematemesis -likely related to his coagulopathy related to his liver failure.  No active bleeding seen at this time. Was On Protonix, octreotide drip.  IV ceftriaxone daily. Octreotide drip stopped.  Protonix changed to IV twice daily. Hemoglobin has remained stable around 11  * Thrombocytopenia Will transfuse platelets prior to EGD or if <20k No Schistocytes Hem-onc on board No platelets have been transfused  *End stage renal disease on dialysis [facility]) -nephrology consulted for dialysis support  *Hypertension - home meds held  At this point patient needs transfer to tertiary care center with GI and anesthesia support.  Discussed with Dr. Agapito Games at [facility] who has graciously accepted patient in transfer.  CONSULTS OBTAINED:  Treatment Team:  Earlie Server, MD  DRUG ALLERGIES:   Allergies  Allergen Reactions  . Codeine Nausea Only    Patient questioned this (??)  . Sulfa Antibiotics Hives and Nausea And Vomiting    DISCHARGE MEDICATIONS:   Allergies as of 05/22/2019      Reactions   Codeine Nausea Only   Patient questioned this (??)   Sulfa Antibiotics Hives, Nausea And Vomiting      Medication List    TAKE these medications   amLODipine 10 MG tablet Commonly known as: NORVASC Take 1 tablet (10 mg total) by mouth daily at 8 pm.   apixaban 2.5 MG Tabs tablet Commonly known as: ELIQUIS Take 1 tablet (2.5 mg total) by mouth 2 (two) times daily.   aspirin EC 81 MG  tablet Take 1 tablet (81 mg total) by mouth daily.   atorvastatin 80 MG tablet Commonly known as: LIPITOR Take 1 tablet (80 mg total) by mouth at bedtime.   carvedilol 25 MG tablet Commonly known as: COREG Take 1 tablet (25 mg total) by mouth 2 (two) times daily with a meal.   gabapentin 100 MG capsule Commonly known as: NEURONTIN Take 1 capsule (100 mg total) by mouth 3 (three) times daily.   hydrALAZINE 25 MG tablet Commonly  known as: APRESOLINE Take 1 tablet (25 mg total) by mouth every 8 (eight) hours.   hydrOXYzine 25 MG tablet Commonly known as: ATARAX/VISTARIL Take 25 mg by mouth 3 (three) times daily as needed for anxiety.   loratadine 10 MG tablet Commonly known as: CLARITIN Take 1 tablet (10 mg total) by mouth daily.   b complex-C-folic acid 1 MG capsule Take 1 capsule by mouth. With supper.   multivitamin Tabs tablet Take 1 tablet by mouth daily.   neomycin-bacitracin-polymyxin Oint Commonly known as: NEOSPORIN Apply 1 application topically 2 (two) times daily.   sevelamer carbonate 800 MG tablet Commonly known as: RENVELA Take 2 tablets (1,600 mg total) by mouth 3 (three) times daily with meals.   traMADol 50 MG tablet Commonly known as: ULTRAM Take 1 tablet (50 mg total) by mouth every 12 (twelve) hours as needed for moderate pain or severe pain.       Today   VITAL SIGNS:  Blood pressure (!) 142/95, pulse 97, temperature 98.1 F (36.7 C), temperature source Oral, resp. rate 20, height 5\' 1"  (1.549 m), weight 50.9 kg, SpO2 92 %.  I/O:    Intake/Output Summary (Last 24 hours) at 05/22/2019 1504 Last data filed at 05/22/2019 0300 Gross per 24 hour  Intake 1080.64 ml  Output -  Net 1080.64 ml    PHYSICAL EXAMINATION:  Physical Exam  GENERAL:  43 y.o.-year-old patient lying in the bed with no acute distress.  LUNGS: Normal breath sounds bilaterally, no wheezing, rales,rhonchi or crepitation. No use of accessory muscles of respiration.  CARDIOVASCULAR: S1, S2 normal. No murmurs, rubs, or gallops.  ABDOMEN: Soft, non-tender, non-distended. Bowel sounds present. No organomegaly or mass.  NEUROLOGIC: Moves all 4 extremities. PSYCHIATRIC: The patient is alert and oriented x 3.  SKIN: No obvious rash, lesion, or ulcer.   DATA REVIEW:   CBC Recent Labs  Lab 05/22/19 0434  WBC 9.4  HGB 11.5*  HCT 34.9*  PLT 35*    Chemistries  Recent Labs  Lab 05/22/19 0434  NA  134*  K 4.2  CL 100  CO2 22  GLUCOSE 140*  BUN 43*  CREATININE 3.80*  CALCIUM 7.8*  AST 210*  ALT 320*  ALKPHOS 90  BILITOT 21.2*    Cardiac Enzymes No results for input(s): TROPONINI in the last 168 hours.  Microbiology Results  Results for orders placed or performed during the hospital encounter of 05/19/19  SARS Coronavirus 2 (CEPHEID - Performed in [place] hospital lab), Hosp Order     Status: None   Collection Time: 05/19/19  5:30 AM   Specimen: Nasopharyngeal Swab  Result Value Ref Range Status   SARS Coronavirus 2 NEGATIVE NEGATIVE Final    Comment: (NOTE) If result is NEGATIVE SARS-CoV-2 target nucleic acids are NOT DETECTED. The SARS-CoV-2 RNA is generally detectable in upper and lower  respiratory specimens during the acute phase of infection. The lowest  concentration of SARS-CoV-2 viral copies this assay can detect is 250  copies /  mL. A negative result does not preclude SARS-CoV-2 infection  and should not be used as the sole basis for treatment or other  patient management decisions.  A negative result may occur with  improper specimen collection / handling, submission of specimen other  than nasopharyngeal swab, presence of viral mutation(s) within the  areas targeted by this assay, and inadequate number of viral copies  (<250 copies / mL). A negative result must be combined with clinical  observations, patient history, and epidemiological information. If result is POSITIVE SARS-CoV-2 target nucleic acids are DETECTED. The SARS-CoV-2 RNA is generally detectable in upper and lower  respiratory specimens dur ing the acute phase of infection.  Positive  results are indicative of active infection with SARS-CoV-2.  Clinical  correlation with patient history and other diagnostic information is  necessary to determine patient infection status.  Positive results do  not rule out bacterial infection or co-infection with other viruses. If result is PRESUMPTIVE  POSTIVE SARS-CoV-2 nucleic acids MAY BE PRESENT.   A presumptive positive result was obtained on the submitted specimen  and confirmed on repeat testing.  While 2019 novel coronavirus  (SARS-CoV-2) nucleic acids may be present in the submitted sample  additional confirmatory testing may be necessary for epidemiological  and / or clinical management purposes  to differentiate between  SARS-CoV-2 and other Sarbecovirus currently known to infect humans.  If clinically indicated additional testing with an alternate test  methodology [phone]) is advised. The SARS-CoV-2 RNA is generally  detectable in upper and lower respiratory sp ecimens during the acute  phase of infection. The expected result is Negative. Fact Sheet for Patients:  StrictlyIdeas.no Fact Sheet for Healthcare Providers: BankingDealers.co.za This test is not yet approved or cleared by the [place] FDA and has been authorized for detection and/or diagnosis of SARS-CoV-2 by FDA under an Emergency Use Authorization (EUA).  This EUA will remain in effect (meaning this test can be used) for the duration of the COVID-19 declaration under Section 564(b)(1) of the Act, 21 U.S.C. section 360bbb-3(b)(1), unless the authorization is terminated or revoked sooner. Performed at [facility], [address] [place], [place] [address]   MRSA PCR Screening     Status: None   Collection Time: 05/19/19  6:15 AM   Specimen: Nasal Mucosa; Nasopharyngeal  Result Value Ref Range Status   MRSA by PCR NEGATIVE NEGATIVE Final    Comment:        The GeneXpert MRSA Assay (FDA approved for NASAL specimens only), is one component of a comprehensive MRSA colonization surveillance program. It is not intended to diagnose MRSA infection nor to guide or monitor treatment for MRSA infections. Performed at [facility], [address] [place], [place] [address]   Culture,  blood (Routine X 2) w Reflex to ID Panel     Status: None (Preliminary result)   Collection Time: 05/19/19  7:08 AM   Specimen: BLOOD  Result Value Ref Range Status   Specimen Description BLOOD RIGHT HAND  Final   Special Requests   Final    BOTTLES DRAWN AEROBIC AND ANAEROBIC Blood Culture results may not be optimal due to an excessive volume of blood received in culture bottles   Culture   Final    NO GROWTH 3 DAYS Performed at [facility], [address] [place], [place] [address]    Report Status PENDING  Incomplete  Culture, blood (Routine X 2) w Reflex to ID Panel     Status:  None (Preliminary result)   Collection Time: 05/19/19  8:48 AM   Specimen: BLOOD  Result Value Ref Range Status   Specimen Description BLOOD RIGHT HAND  Final   Special Requests   Final    BOTTLES DRAWN AEROBIC ONLY Blood Culture results may not be optimal due to an excessive volume of blood received in culture bottles   Culture   Final    NO GROWTH 3 DAYS Performed at [facility], [address], [place], [place] [address]    Report Status PENDING  Incomplete    RADIOLOGY:  Ct Abdomen Pelvis Wo Contrast  Result Date: 05/19/2019 CLINICAL DATA:  Liver failure, hematemesis EXAM: CT ABDOMEN AND PELVIS WITHOUT CONTRAST TECHNIQUE: Multidetector CT imaging of the abdomen and pelvis was performed following the standard protocol without IV contrast. Oral enteric contrast was administered. COMPARISON:  None. FINDINGS: Lower chest: Small pericardial effusion. Hepatobiliary: No solid liver abnormality is seen. No gallstones, gallbladder wall thickening, or biliary dilatation. Pancreas: Unremarkable. No pancreatic ductal dilatation or surrounding inflammatory changes. Spleen: Normal in size without significant abnormality. Adrenals/Urinary Tract: Adrenal glands are unremarkable. Atrophic kidneys. Bladder is unremarkable. Stomach/Bowel: Stomach is within normal limits. Appendix appears normal. No  evidence of bowel wall thickening, distention, or inflammatory changes. Vascular/Lymphatic: There is complex multi component aortobifemoral graft bypass. No enlarged abdominal or pelvic lymph nodes. Reproductive: No mass or other significant abnormality. Other: No abdominal wall hernia or abnormality. Small volume ascites. Musculoskeletal: No acute or significant osseous findings. IMPRESSION: 1.  Small volume ascites. 2.  Aortobifemoral graft bypass. 3.  Atrophic kidneys. 4.  Pericardial effusion. Electronically Signed   By: Eddie Candle M.D.   On: 05/19/2019 12:30   Dg Abdomen Acute W/chest  Result Date: 05/19/2019 CLINICAL DATA:  Hematemesis, epigastric abdominal pain EXAM: DG ABDOMEN ACUTE W/ 1V CHEST COMPARISON:  Chest radiographs dated 03/02/2019 FINDINGS: Lungs are clear.  No pleural effusion or pneumothorax. The heart is normal in size.  Left axillary vascular stent. Nonobstructive bowel gas pattern. No evidence of free air under the diaphragm on the upright view. Vascular stents overlying the upper abdomen. Additional right common iliac artery stent. Surgical clips overlying the left lower abdomen. Visualized osseous structures are within normal limits. IMPRESSION: No evidence of acute cardiopulmonary disease. No evidence of small bowel obstruction or free air. Electronically Signed   By: Julian Hy M.D.   On: 05/19/2019 03:03   US Abdomen Limited Ruq  Result Date: 05/19/2019 CLINICAL DATA:  Hepatitis. EXAM: ULTRASOUND ABDOMEN LIMITED RIGHT UPPER QUADRANT COMPARISON:  None. FINDINGS: Gallbladder: Sludge within the partially distended gallbladder. No wall thickening or focal tenderness. Common bile duct: Diameter: 5 mm.  Where visualized, no filling defect. Liver: Echogenic liver with small volume ascites. No evident mass. Portal vein is patent on color Doppler imaging with normal direction of blood flow towards the liver. Marked right renal atrophy. IMPRESSION: 1. Gallbladder sludge. 2.  Hepatocellular disease. 3. Small ascites. 4. Marked right renal atrophy. Electronically Signed   By: Monte Fantasia M.D.   On: 05/19/2019 04:45    Follow up with PCP in 1 week.  Management plans discussed with the patient, family and they are in agreement.  CODE STATUS:     Code Status Orders  (From admission, onward)         Start     Ordered   05/19/19 0604  Full code  Continuous     05/19/19 0603        Code Status History  Date Active Date Inactive Code Status Order ID Comments User Context   02/28/2019 1805 03/03/2019 2234 Full Code [id number]  Vaughan Basta, MD Inpatient   02/21/2019 0835 02/24/2019 1945 Full Code [id number]  Harrie Foreman, MD Inpatient   02/04/2019 1343 02/09/2019 2050 Full Code [id number]  Saundra Shelling, MD ED   01/27/2019 0654 01/29/2019 2049 Partial Code [id number]  Harrie Foreman, MD Inpatient   01/07/2019 2156 01/27/2019 0638 Partial Code [id number]  Tennis Ship, MD Inpatient   12/25/2018 0158 01/07/2019 2027 Partial Code [id number]  Ina Homes, MD ED   Advance Care Planning Activity      TOTAL TIME TAKING CARE OF THIS PATIENT ON DAY OF DISCHARGE: more than 30 minutes.   Leia Alf Alonni Heimsoth M.D on 05/22/2019 at 3:04 PM  Between 7am to 6pm - Pager - [phone]  After 6pm go to [email or web address] - password [email or web address] [facility]  Office  [phone]  CC: Primary care physician; Patient, [name]  Note: This dictation was prepared with Dragon dictation along with smaller phrase technology. Any transcriptional errors that result from this process are unintentional.

## 2019-05-19 NOTE — Progress Notes (Signed)
[facility]  ROUNDING NOTE   Subjective:   Mr. Forest Redwine admitted to [facility] on 05/19/2019 for Hepatitis [K75.9] Hematemesis with nausea [K92.0] Acute liver failure without hepatic coma [K72.00]  Patient's last hemodialysis treatment was Saturday 7/25.   Patient complains of 3-4 days of nausea, vomiting and developed hematemesis last night. Denies any abdominal pain. Found to have acute hepatitis with elevated transaminases and elevated bilirubin. Visibly jaundiced.   Objective:  Vital signs in last 24 hours:  Temp:  [97.6 F (36.4 C)-97.7 F (36.5 C)] 97.6 F (36.4 C) (07/28 0616) Pulse Rate:  [86-98] 86 (07/28 0616) Resp:  [16-20] 20 (07/28 0616) BP: (93-162)/(60-92) 93/60 (07/28 0616) SpO2:  [97 %-100 %] 97 % (07/28 0616) Weight:  [45.4 kg-47.6 kg] 47.6 kg (07/28 0616)  Weight change:  Filed Weights   05/19/19 0151 05/19/19 0616  Weight: 45.4 kg 47.6 kg    Intake/Output: No intake/output data recorded.   Intake/Output this shift:  No intake/output data recorded.  Physical Exam: General: NAD, laying in bed  Head: Normocephalic, atraumatic. Moist oral mucosal membranes  Eyes: +icteric sclera  Neck: Supple   Lungs:  Clear to auscultation  Heart: Regular rate and rhythm  Abdomen:  Soft, nontender  Extremities: no peripheral edema.  Neurologic: +tremor  Skin: +jaundice  Access: Left AVG    Basic Metabolic Panel: Recent Labs  Lab 05/19/19 0209 05/19/19 0708  NA 132* 133*  K 4.6 4.8  CL 88* 90*  CO2 13* 17*  GLUCOSE 99 114*  BUN 87* 98*  CREATININE 7.01* 7.32*  CALCIUM 8.2* 7.8*    Liver Function Tests: Recent Labs  Lab 05/19/19 0209 05/19/19 0708  AST 379* 380*  ALT 679* 607*  ALKPHOS 95 87  BILITOT 14.0* 12.7*  PROT 5.8* 5.2*  ALBUMIN 3.1* 2.7*   Recent Labs  Lab 05/19/19 0209  LIPASE 79*   No results for input(s): AMMONIA in the last 168 hours.  CBC: Recent Labs  Lab 05/19/19 0209 05/19/19 0708  WBC 12.0* 8.8   NEUTROABS  --  7.4  HGB 11.9* 10.3*  HCT 36.7* 31.1*  MCV 99.5 98.4  PLT 28* 24*    Cardiac Enzymes: No results for input(s): CKTOTAL, CKMB, CKMBINDEX, TROPONINI in the last 168 hours.  BNP: Invalid input(s): POCBNP  CBG: No results for input(s): GLUCAP in the last 168 hours.  Microbiology: Results for orders placed or performed during the hospital encounter of 05/19/19  SARS Coronavirus 2 (CEPHEID - Performed in [place] hospital lab), Hosp Order     Status: None   Collection Time: 05/19/19  5:30 AM   Specimen: Nasopharyngeal Swab  Result Value Ref Range Status   SARS Coronavirus 2 NEGATIVE NEGATIVE Final    Comment: (NOTE) If result is NEGATIVE SARS-CoV-2 target nucleic acids are NOT DETECTED. The SARS-CoV-2 RNA is generally detectable in upper and lower  respiratory specimens during the acute phase of infection. The lowest  concentration of SARS-CoV-2 viral copies this assay can detect is 250  copies / mL. A negative result does not preclude SARS-CoV-2 infection  and should not be used as the sole basis for treatment or other  patient management decisions.  A negative result may occur with  improper specimen collection / handling, submission of specimen other  than nasopharyngeal swab, presence of viral mutation(s) within the  areas targeted by this assay, and inadequate number of viral copies  (<250 copies / mL). A negative result must be combined with clinical  observations,  patient history, and epidemiological information. If result is POSITIVE SARS-CoV-2 target nucleic acids are DETECTED. The SARS-CoV-2 RNA is generally detectable in upper and lower  respiratory specimens dur ing the acute phase of infection.  Positive  results are indicative of active infection with SARS-CoV-2.  Clinical  correlation with patient history and other diagnostic information is  necessary to determine patient infection status.  Positive results do  not rule out bacterial infection  or co-infection with other viruses. If result is PRESUMPTIVE POSTIVE SARS-CoV-2 nucleic acids MAY BE PRESENT.   A presumptive positive result was obtained on the submitted specimen  and confirmed on repeat testing.  While 2019 novel coronavirus  (SARS-CoV-2) nucleic acids may be present in the submitted sample  additional confirmatory testing may be necessary for epidemiological  and / or clinical management purposes  to differentiate between  SARS-CoV-2 and other Sarbecovirus currently known to infect humans.  If clinically indicated additional testing with an alternate test  methodology [phone]) is advised. The SARS-CoV-2 RNA is generally  detectable in upper and lower respiratory sp ecimens during the acute  phase of infection. The expected result is Negative. Fact Sheet for Patients:  StrictlyIdeas.no Fact Sheet for Healthcare Providers: BankingDealers.co.za This test is not yet approved or cleared by the [place] FDA and has been authorized for detection and/or diagnosis of SARS-CoV-2 by FDA under an Emergency Use Authorization (EUA).  This EUA will remain in effect (meaning this test can be used) for the duration of the COVID-19 declaration under Section 564(b)(1) of the Act, 21 U.S.C. section 360bbb-3(b)(1), unless the authorization is terminated or revoked sooner. Performed at [facility], [address] [place], [place] [address]   MRSA PCR Screening     Status: None   Collection Time: 05/19/19  6:15 AM   Specimen: Nasal Mucosa; Nasopharyngeal  Result Value Ref Range Status   MRSA by PCR NEGATIVE NEGATIVE Final    Comment:        The GeneXpert MRSA Assay (FDA approved for NASAL specimens only), is one component of a comprehensive MRSA colonization surveillance program. It is not intended to diagnose MRSA infection nor to guide or monitor treatment for MRSA infections. Performed at [facility], [address] [place], [place] [address]     Coagulation Studies: Recent Labs    05/19/19 0328  LABPROT 44.9*  INR 4.9*    Urinalysis: No results for input(s): COLORURINE, LABSPEC, PHURINE, GLUCOSEU, HGBUR, BILIRUBINUR, KETONESUR, PROTEINUR, UROBILINOGEN, NITRITE, LEUKOCYTESUR in the last 72 hours.  Invalid input(s): APPERANCEUR    Imaging: Dg Abdomen Acute W/chest  Result Date: 05/19/2019 CLINICAL DATA:  Hematemesis, epigastric abdominal pain EXAM: DG ABDOMEN ACUTE W/ 1V CHEST COMPARISON:  Chest radiographs dated 03/02/2019 FINDINGS: Lungs are clear.  No pleural effusion or pneumothorax. The heart is normal in size.  Left axillary vascular stent. Nonobstructive bowel gas pattern. No evidence of free air under the diaphragm on the upright view. Vascular stents overlying the upper abdomen. Additional right common iliac artery stent. Surgical clips overlying the left lower abdomen. Visualized osseous structures are within normal limits. IMPRESSION: No evidence of acute cardiopulmonary disease. No evidence of small bowel obstruction or free air. Electronically Signed   By: Julian Hy M.D.   On: 05/19/2019 03:03   US Abdomen Limited Ruq  Result Date: 05/19/2019 CLINICAL DATA:  Hepatitis. EXAM: ULTRASOUND ABDOMEN LIMITED RIGHT UPPER QUADRANT COMPARISON:  None. FINDINGS: Gallbladder: Sludge within the partially distended gallbladder. No wall thickening or focal tenderness.  Common bile duct: Diameter: 5 mm.  Where visualized, no filling defect. Liver: Echogenic liver with small volume ascites. No evident mass. Portal vein is patent on color Doppler imaging with normal direction of blood flow towards the liver. Marked right renal atrophy. IMPRESSION: 1. Gallbladder sludge. 2. Hepatocellular disease. 3. Small ascites. 4. Marked right renal atrophy. Electronically Signed   By: Monte Fantasia M.D.   On: 05/19/2019 04:45     Medications:   . [START ON 05/20/2019] cefTRIAXone  (ROCEPHIN)  IV    . octreotide  (SANDOSTATIN)    IV infusion 50 mcg/hr (05/19/19 0439)  . pantoprozole (PROTONIX) infusion 8 mg/hr (05/19/19 0331)  . sodium chloride     . Chlorhexidine Gluconate Cloth  6 each Topical Q0600  . [START ON 05/22/2019] pantoprazole  40 mg Intravenous Q12H   ondansetron **OR** ondansetron (ZOFRAN) IV  Assessment/ Plan:  Mr. Arvind Mexicano is a 43 y.o. white male with end stage renal disease on hemodialysis, hypertension, peripheral vascular disease, depression who is admitted to [facility] on 05/19/2019 for Hepatitis [K75.9] Hematemesis with nausea [K92.0] Acute liver failure without hepatic coma [K72.00]  [place]. MTTS (four times a week) 48kg Left AVG  1. End Stage Renal Disease: hemodialysis for later today. Orders prepared.  Continue to monitor daily for dialysis need  2. Hypertension: holding blood pressure medications. Home regimen of amlodipine, carvedilol, and hydralazine.  3. Anemia of chronic kidney disease: hemoglobin 10.3.  - EPO with HD treatment.   4. Secondary Hyperparathyroidism: outpatient labs on 7/16 PTH 929, phosphorus 3.9, calcium 8.4.  - sevelamer with meals once restart PO diet.   5. Acute liver failure with jaundice: new presentation. Patient denies alcohol use. Denies IVDA Acute viral hepatitis panel negative in March, April and May.  RUQ ultrasound with gallbladder sludge.     LOS: 0 Bryttney Netzer 7/28/202011:30 AM

## 2019-05-19 NOTE — Progress Notes (Signed)
GI and anesthesia requesting patient be transferred to tertiary care center.  Call [facility].  Discussed with Dr. Gershon Mussel.  Since patient has no acute bleeding advised finishing work-up and further differential here at [facility].  Advised calling back if patient does not improve or decompensates.

## 2019-05-19 NOTE — H&P (Signed)
[place] [facility] [place] NAME: Brett Small    MR#:  [id number]  DATE OF BIRTH:  04/05/1976  DATE OF ADMISSION:  05/19/2019  PRIMARY CARE PHYSICIAN: Patient, [name]   REQUESTING/REFERRING PHYSICIAN: Alfred Levins, MD  CHIEF COMPLAINT:   Chief Complaint  Patient presents with  . Hematemesis    HISTORY OF PRESENT ILLNESS:  Brett Small  is a 43 y.o. male who presents with chief complaint as above.  Patient presents to the ED with a complaint of hematemesis.  He states that for the past 2 weeks or so he has been having intermittent nausea with vomiting.  He denies frank abdominal pain.  He states that he has been having some heartburn, and then some vomiting.  He states initially this was a greenish emesis.  However, tonight he vomited up coffee-ground emesis.  He came to the ED for evaluation.  Here he is found to be in acute liver failure, with elevated transaminases and significantly elevated T bili.  He also has thrombocytopenia and an elevated INR.  This is compared to normal liver labs earlier this year.  Hospitalist were called for admission  PAST MEDICAL HISTORY:   Past Medical History:  Diagnosis Date  . Hypertension   . Renal disorder      PAST SURGICAL HISTORY:   Past Surgical History:  Procedure Laterality Date  . A/V SHUNT INTERVENTION Left 01/19/2019   Procedure: LEFT UPPER EXTREMITY A/V SHUNTOGRAM / UPPER EXTREMITY ANGIOGRAM;  Surgeon: Algernon Huxley, MD;  Location: [place] [facility];  Service: Cardiovascular;  Laterality: Left;  . A/V SHUNT INTERVENTION Left 03/02/2019   Procedure: A/V SHUNT INTERVENTION;  Surgeon: Algernon Huxley, MD;  Location: [place] [facility];  Service: Cardiovascular;  Laterality: Left;  . AORTA - FEMORAL ARTERY BYPASS GRAFT    . AV FISTULA PLACEMENT Left 12/26/2018   Procedure: INSERTION OF GORE STRETCH VASCULAR 4-7MM X  45CM IN LEFT UPPER ARM;  Surgeon: Marty Heck, MD;  Location: [place];  Service: Vascular;  Laterality: Left;  . DIALYSIS/PERMA CATHETER REMOVAL N/A 02/06/2019   Procedure: DIALYSIS/PERMA CATHETER REMOVAL;  Surgeon: Algernon Huxley, MD;  Location: [place] [facility];  Service: Cardiovascular;  Laterality: N/A;     SOCIAL HISTORY:   Social History   Tobacco Use  . Smoking status: Current Every Day Smoker    Last attempt to quit: 06/26/2018    Years since quitting: 0.8  . Smokeless tobacco: Never Used  . Tobacco comment: smoked for 30 years   Substance Use Topics  . Alcohol use: Not Currently     FAMILY HISTORY:   Family History  Problem Relation Age of Onset  . Hypertension Other   . Diabetes Other   . Clotting disorder Father      DRUG ALLERGIES:   Allergies  Allergen Reactions  . Codeine Nausea Only    Patient questioned this (??)  . Sulfa Antibiotics Hives and Nausea And Vomiting    MEDICATIONS AT HOME:   Prior to Admission medications   Medication Sig Start Date End Date Taking? Authorizing Provider  amLODipine (NORVASC) 10 MG tablet Take 1 tablet (10 mg total) by mouth daily at 8 pm. 02/24/19  Yes Gladstone Lighter, MD  apixaban (ELIQUIS) 2.5 MG TABS tablet Take 1 tablet (2.5 mg total) by mouth 2 (two) times daily. 01/26/19  Yes Clapacs, Madie Reno, MD  aspirin EC 81 MG tablet Take 1 tablet (81  mg total) by mouth daily. 01/26/19  Yes Clapacs, Madie Reno, MD  atorvastatin (LIPITOR) 80 MG tablet Take 1 tablet (80 mg total) by mouth at bedtime. 01/26/19  Yes Clapacs, Madie Reno, MD  carvedilol (COREG) 25 MG tablet Take 1 tablet (25 mg total) by mouth 2 (two) times daily with a meal. 01/26/19  Yes Clapacs, Madie Reno, MD  gabapentin (NEURONTIN) 100 MG capsule Take 1 capsule (100 mg total) by mouth 3 (three) times daily. 01/26/19  Yes Clapacs, Madie Reno, MD  hydrALAZINE (APRESOLINE) 25 MG tablet Take 1 tablet (25 mg total) by mouth every 8 (eight) hours. 01/26/19  Yes Clapacs, Madie Reno, MD  hydrOXYzine (ATARAX/VISTARIL) 25 MG tablet Take 25 mg by mouth 3 (three) times  daily as needed for anxiety.   Yes [provider]  loratadine (CLARITIN) 10 MG tablet Take 1 tablet (10 mg total) by mouth daily. 03/04/19  Yes Salary, Avel Peace, MD  multivitamin (RENA-VIT) TABS tablet Take 1 tablet by mouth daily. 01/27/19  Yes Clapacs, Madie Reno, MD  neomycin-bacitracin-polymyxin (NEOSPORIN) OINT Apply 1 application topically 2 (two) times daily. 01/26/19  Yes Clapacs, Madie Reno, MD  sevelamer carbonate (RENVELA) 800 MG tablet Take 2 tablets (1,600 mg total) by mouth 3 (three) times daily with meals. 01/26/19  Yes Clapacs, Madie Reno, MD  traMADol (ULTRAM) 50 MG tablet Take 1 tablet (50 mg total) by mouth every 12 (twelve) hours as needed for moderate pain or severe pain. 03/03/19  Yes Salary, Avel Peace, MD    REVIEW OF SYSTEMS:  Review of Systems  Constitutional: Positive for malaise/fatigue. Negative for chills, fever and weight loss.  HENT: Negative for ear pain, hearing loss and tinnitus.   Eyes: Negative for blurred vision, double vision, pain and redness.  Respiratory: Negative for cough, hemoptysis and shortness of breath.   Cardiovascular: Negative for chest pain, palpitations, orthopnea and leg swelling.  Gastrointestinal: Negative for abdominal pain, constipation, diarrhea, nausea and vomiting.       Hematemesis  Genitourinary: Negative for dysuria, frequency and hematuria.  Musculoskeletal: Negative for back pain, joint pain and neck pain.  Skin:       No acne, rash, or lesions  Neurological: Negative for dizziness, tremors, focal weakness and weakness.  Endo/Heme/Allergies: Negative for polydipsia. Does not bruise/bleed easily.  Psychiatric/Behavioral: Negative for depression. The patient is not nervous/anxious and does not have insomnia.      VITAL SIGNS:   Vitals:   05/19/19 0150 05/19/19 0151  BP: (!) 162/92   Pulse: 98   Resp: 18   Temp: 97.7 F (36.5 C)   SpO2: 100%   Weight:  45.4 kg  Height:  1' (0.305 m)   Wt Readings from Last 3 Encounters:   05/19/19 45.4 kg  03/03/19 48 kg  02/24/19 45 kg    PHYSICAL EXAMINATION:  Physical Exam  Vitals reviewed. Constitutional: He is oriented to person, place, and time. He appears well-developed and well-nourished. No distress.  HENT:  Head: Normocephalic and atraumatic.  Mouth/Throat: Oropharynx is clear and moist.  Eyes: Pupils are equal, round, and reactive to light. Conjunctivae and EOM are normal. No scleral icterus.  Neck: Normal range of motion. Neck supple. No JVD present. No thyromegaly present.  Cardiovascular: Normal rate, regular rhythm and intact distal pulses. Exam reveals no gallop and no friction rub.  No murmur heard. Respiratory: Effort normal and breath sounds normal. No respiratory distress. He has no wheezes. He has no rales.  GI: Soft. Bowel sounds are  normal. He exhibits no distension. There is no abdominal tenderness.  Musculoskeletal: Normal range of motion.        General: No edema.     Comments: No arthritis, no gout  Lymphadenopathy:    He has no cervical adenopathy.  Neurological: He is alert and oriented to person, place, and time. No cranial nerve deficit.  No dysarthria, no aphasia  Skin: Skin is warm and dry. No rash noted. No erythema.  Psychiatric: He has a normal mood and affect. His behavior is normal. Judgment and thought content normal.    LABORATORY PANEL:   CBC Recent Labs  Lab 05/19/19 0209  WBC 12.0*  HGB 11.9*  HCT 36.7*  PLT 28*   ------------------------------------------------------------------------------------------------------------------  Chemistries  Recent Labs  Lab 05/19/19 0209  NA 132*  K 4.6  CL 88*  CO2 13*  GLUCOSE 99  BUN 87*  CREATININE 7.01*  CALCIUM 8.2*  AST 379*  ALT 679*  ALKPHOS 95  BILITOT 14.0*   ------------------------------------------------------------------------------------------------------------------  Cardiac Enzymes No results for input(s): TROPONINI in the last 168  hours. ------------------------------------------------------------------------------------------------------------------  RADIOLOGY:  Dg Abdomen Acute W/chest  Result Date: 05/19/2019 CLINICAL DATA:  Hematemesis, epigastric abdominal pain EXAM: DG ABDOMEN ACUTE W/ 1V CHEST COMPARISON:  Chest radiographs dated 03/02/2019 FINDINGS: Lungs are clear.  No pleural effusion or pneumothorax. The heart is normal in size.  Left axillary vascular stent. Nonobstructive bowel gas pattern. No evidence of free air under the diaphragm on the upright view. Vascular stents overlying the upper abdomen. Additional right common iliac artery stent. Surgical clips overlying the left lower abdomen. Visualized osseous structures are within normal limits. IMPRESSION: No evidence of acute cardiopulmonary disease. No evidence of small bowel obstruction or free air. Electronically Signed   By: Julian Hy M.D.   On: 05/19/2019 03:03   US Abdomen Limited Ruq  Result Date: 05/19/2019 CLINICAL DATA:  Hepatitis. EXAM: ULTRASOUND ABDOMEN LIMITED RIGHT UPPER QUADRANT COMPARISON:  None. FINDINGS: Gallbladder: Sludge within the partially distended gallbladder. No wall thickening or focal tenderness. Common bile duct: Diameter: 5 mm.  Where visualized, no filling defect. Liver: Echogenic liver with small volume ascites. No evident mass. Portal vein is patent on color Doppler imaging with normal direction of blood flow towards the liver. Marked right renal atrophy. IMPRESSION: 1. Gallbladder sludge. 2. Hepatocellular disease. 3. Small ascites. 4. Marked right renal atrophy. Electronically Signed   By: Monte Fantasia M.D.   On: 05/19/2019 04:45    EKG:   Orders placed or performed during the hospital encounter of 05/19/19  . EKG 12-Lead  . EKG 12-Lead  . ED EKG  . ED EKG    IMPRESSION AND PLAN:  Principal Problem:   Acute liver failure -unclear etiology upfront.  Sending acute hepatitis panel.  Patient was HIV- on 12/2018.   Patient has gallbladder sludge on ultrasound, but no significant pathology within the common bile duct.  Tylenol level pending.  Patient does have an elevated INR, and thrombocytopenia.  Blood smear for schistocytes pending, though the patient's anemia is actually better than previous values, and he has no neurologic symptoms.  He is an end-stage renal disease patient already, so that makes it difficult to evaluate the renal component for TTP evaluation. Active Problems:   Hematemesis -likely related to his coagulopathy related to his liver failure.  GI contacted by ED physician, keep patient n.p.o. and prep for EGD per their request.  Patient is on octreotide and Protonix drips.   End stage renal  disease on dialysis [facility]) -nephrology consult for dialysis support   Hypertension -continue home meds  Chart review performed and case discussed with ED provider. Labs, imaging and/or ECG reviewed by provider and discussed with patient/family. Management plans discussed with the patient and/or family.  COVID-19 status: Pending  DVT PROPHYLAXIS: Mechanical only  GI PROPHYLAXIS:  PPI   ADMISSION STATUS: Inpatient     CODE STATUS: Full Code Status History    Date Active Date Inactive Code Status Order ID Comments User Context   02/28/2019 1805 03/03/2019 2234 Full Code [id number]  Vaughan Basta, MD Inpatient   02/21/2019 0835 02/24/2019 1945 Full Code [id number]  Harrie Foreman, MD Inpatient   02/04/2019 1343 02/09/2019 2050 Full Code [id number]  Saundra Shelling, MD ED   01/27/2019 0654 01/29/2019 2049 Partial Code [id number]  Harrie Foreman, MD Inpatient   01/07/2019 2156 01/27/2019 0638 Partial Code [id number]  Tennis Ship, MD Inpatient   12/25/2018 0158 01/07/2019 2027 Partial Code [id number]  Ina Homes, MD ED   Advance Care Planning Activity      TOTAL TIME TAKING CARE OF THIS PATIENT: 45 minutes.   This patient was evaluated in the context of the global COVID-19 pandemic, which  necessitated consideration that the patient might be at risk for infection with the SARS-CoV-2 virus that causes COVID-19. Institutional protocols and algorithms that pertain to the evaluation of patients at risk for COVID-19 are in a state of rapid change based on information released by regulatory bodies including the CDC and federal and state organizations. These policies and algorithms were followed to the best of this provider's knowledge to date during the patient's care at this facility.  Ethlyn Daniels 05/19/2019, 5:16 AM  [facility]  Office  [phone]  CC: Primary care physician; Patient, [name]  Note:  This document was prepared using Dragon voice recognition software and may include unintentional dictation errors.

## 2019-05-19 NOTE — Consult Note (Signed)
Vonda Antigua, MD [address] [place], [place], [place], [address] [address], [place], [place], [place], [address] Phone: [phone]  Fax: [phone]  Consultation  Referring Provider:     Dr. Darvin Neighbours Primary Care Physician:  Patient, [name] Reason for Consultation:     Anemia  Date of Admission:  05/19/2019 Date of Consultation:  05/19/2019         HPI:   Jsaon Yoo is a 43 y.o. male With ESRD on hemodialysis who presented after 2-week history of nausea vomiting and coffee-ground emesis and found to be in acute liver failure with no previous history of liver disease.  Patient also found to have elevated INR, low platelets.  Patient is alert and oriented.  Denies any recent or previous history of heavy alcohol use.  Denies any heavy Tylenol use.  Used 2 pills of Tylenol in 1 day and that is it.  Denies any herbal supplements or other over-the-counter products.  The patient denies abdominal or flank pain, anorexia, nausea or vomiting, dysphagia, change in bowel habits or black or bloody stools or weight loss.   Past Medical History:  Diagnosis Date   Hypertension    Renal disorder     Past Surgical History:  Procedure Laterality Date   A/V SHUNT INTERVENTION Left 01/19/2019   Procedure: LEFT UPPER EXTREMITY A/V SHUNTOGRAM / UPPER EXTREMITY ANGIOGRAM;  Surgeon: Algernon Huxley, MD;  Location: [place] [facility];  Service: Cardiovascular;  Laterality: Left;   A/V SHUNT INTERVENTION Left 03/02/2019   Procedure: A/V SHUNT INTERVENTION;  Surgeon: Algernon Huxley, MD;  Location: [place] [facility];  Service: Cardiovascular;  Laterality: Left;   AORTA - FEMORAL ARTERY BYPASS GRAFT     AV FISTULA PLACEMENT Left 12/26/2018   Procedure: INSERTION OF GORE STRETCH VASCULAR 4-7MM X  45CM IN LEFT UPPER ARM;  Surgeon: Marty Heck, MD;  Location: [place];  Service: Vascular;  Laterality: Left;   DIALYSIS/PERMA CATHETER REMOVAL N/A 02/06/2019   Procedure:  DIALYSIS/PERMA CATHETER REMOVAL;  Surgeon: Algernon Huxley, MD;  Location: [place] [facility];  Service: Cardiovascular;  Laterality: N/A;    Prior to Admission medications   Medication Sig Start Date End Date Taking? Authorizing Provider  amLODipine (NORVASC) 10 MG tablet Take 1 tablet (10 mg total) by mouth daily at 8 pm. 02/24/19  Yes Gladstone Lighter, MD  apixaban (ELIQUIS) 2.5 MG TABS tablet Take 1 tablet (2.5 mg total) by mouth 2 (two) times daily. 01/26/19  Yes Clapacs, Madie Reno, MD  aspirin EC 81 MG tablet Take 1 tablet (81 mg total) by mouth daily. 01/26/19  Yes Clapacs, Madie Reno, MD  atorvastatin (LIPITOR) 80 MG tablet Take 1 tablet (80 mg total) by mouth at bedtime. 01/26/19  Yes Clapacs, Madie Reno, MD  b complex-C-folic acid 1 MG capsule Take 1 capsule by mouth. With supper. 09/18/18  Yes [provider]  carvedilol (COREG) 25 MG tablet Take 1 tablet (25 mg total) by mouth 2 (two) times daily with a meal. 01/26/19  Yes Clapacs, Madie Reno, MD  gabapentin (NEURONTIN) 100 MG capsule Take 1 capsule (100 mg total) by mouth 3 (three) times daily. 01/26/19  Yes Clapacs, Madie Reno, MD  hydrALAZINE (APRESOLINE) 25 MG tablet Take 1 tablet (25 mg total) by mouth every 8 (eight) hours. 01/26/19  Yes Clapacs, Madie Reno, MD  hydrOXYzine (ATARAX/VISTARIL) 25 MG tablet Take 25 mg by mouth 3 (three) times daily as needed for anxiety.  Yes [provider]  loratadine (CLARITIN) 10 MG tablet Take 1 tablet (10 mg total) by mouth daily. 03/04/19  Yes Salary, Avel Peace, MD  multivitamin (RENA-VIT) TABS tablet Take 1 tablet by mouth daily. 01/27/19  Yes Clapacs, Madie Reno, MD  neomycin-bacitracin-polymyxin (NEOSPORIN) OINT Apply 1 application topically 2 (two) times daily. 01/26/19  Yes Clapacs, Madie Reno, MD  sevelamer carbonate (RENVELA) 800 MG tablet Take 2 tablets (1,600 mg total) by mouth 3 (three) times daily with meals. 01/26/19  Yes Clapacs, Madie Reno, MD  traMADol (ULTRAM) 50 MG tablet Take 1 tablet (50 mg total) by mouth  every 12 (twelve) hours as needed for moderate pain or severe pain. 03/03/19  Yes Salary, Avel Peace, MD    Family History  Problem Relation Age of Onset   Hypertension Other    Diabetes Other    Clotting disorder Father      Social History   Tobacco Use   Smoking status: Current Every Day Smoker    Last attempt to quit: 06/26/2018    Years since quitting: 0.8   Smokeless tobacco: Never Used   Tobacco comment: smoked for 30 years   Substance Use Topics   Alcohol use: Not Currently   Drug use: Never    Allergies as of 05/19/2019 - Review Complete 05/19/2019  Allergen Reaction Noted   Codeine Nausea Only 09/22/2018   Sulfa antibiotics Hives and Nausea And Vomiting 08/29/2018    Review of Systems:    All systems reviewed and negative except where noted in HPI.   Physical Exam:  Vital signs in last 24 hours: Vitals:   05/19/19 1330 05/19/19 1340 05/19/19 1355 05/19/19 1410  BP: (!) (P) 143/91 (!) (P) 147/102 (!) (P) 145/111 (!) (P) 144/88  Pulse: (P) 82 (P) 83 (P) 84 (P) 87  Resp: (P) 16     Temp: (P) 98 F (36.7 C)     TempSrc: (P) Oral     SpO2: (P) 100% (P) 100% (P) 100% (P) 100%  Weight:      Height:       Last BM Date: 05/19/19 General:   Pleasant, cooperative in NAD Head:  Normocephalic and atraumatic. Eyes:   No icterus.   Conjunctiva pink. PERRLA. Ears:  Normal auditory acuity. Neck:  Supple; no masses or thyroidomegaly Lungs: Respirations even and unlabored. Lungs clear to auscultation bilaterally.   No wheezes, crackles, or rhonchi.  Abdomen:  Soft, nondistended, nontender. Normal bowel sounds. No appreciable masses or hepatomegaly.  No rebound or guarding.  Neurologic:  Alert and oriented x3;  grossly normal neurologically. Skin:  Intact without significant lesions or rashes. Cervical Nodes:  No significant cervical adenopathy. Psych:  Alert and cooperative. Normal affect.  LAB RESULTS: Recent Labs    05/19/19 0209 05/19/19 0708  WBC 12.0*  8.8  HGB 11.9* 10.3*  HCT 36.7* 31.1*  PLT 28* 24*   BMET Recent Labs    05/19/19 0209 05/19/19 0708  NA 132* 133*  K 4.6 4.8  CL 88* 90*  CO2 13* 17*  GLUCOSE 99 114*  BUN 87* 98*  CREATININE 7.01* 7.32*  CALCIUM 8.2* 7.8*   LFT Recent Labs    05/19/19 0708 05/19/19 0848  PROT 5.2*  --   ALBUMIN 2.7*  --   AST 380*  --   ALT 607*  --   ALKPHOS 87  --   BILITOT 12.7*  --   BILIDIR  --  7.3*   PT/INR Recent Labs  05/19/19 0328  LABPROT 44.9*  INR 4.9*    STUDIES: Ct Abdomen Pelvis Wo Contrast  Result Date: 05/19/2019 CLINICAL DATA:  Liver failure, hematemesis EXAM: CT ABDOMEN AND PELVIS WITHOUT CONTRAST TECHNIQUE: Multidetector CT imaging of the abdomen and pelvis was performed following the standard protocol without IV contrast. Oral enteric contrast was administered. COMPARISON:  None. FINDINGS: Lower chest: Small pericardial effusion. Hepatobiliary: No solid liver abnormality is seen. No gallstones, gallbladder wall thickening, or biliary dilatation. Pancreas: Unremarkable. No pancreatic ductal dilatation or surrounding inflammatory changes. Spleen: Normal in size without significant abnormality. Adrenals/Urinary Tract: Adrenal glands are unremarkable. Atrophic kidneys. Bladder is unremarkable. Stomach/Bowel: Stomach is within normal limits. Appendix appears normal. No evidence of bowel wall thickening, distention, or inflammatory changes. Vascular/Lymphatic: There is complex multi component aortobifemoral graft bypass. No enlarged abdominal or pelvic lymph nodes. Reproductive: No mass or other significant abnormality. Other: No abdominal wall hernia or abnormality. Small volume ascites. Musculoskeletal: No acute or significant osseous findings. IMPRESSION: 1.  Small volume ascites. 2.  Aortobifemoral graft bypass. 3.  Atrophic kidneys. 4.  Pericardial effusion. Electronically Signed   By: Eddie Candle M.D.   On: 05/19/2019 12:30   Dg Abdomen Acute W/chest  Result  Date: 05/19/2019 CLINICAL DATA:  Hematemesis, epigastric abdominal pain EXAM: DG ABDOMEN ACUTE W/ 1V CHEST COMPARISON:  Chest radiographs dated 03/02/2019 FINDINGS: Lungs are clear.  No pleural effusion or pneumothorax. The heart is normal in size.  Left axillary vascular stent. Nonobstructive bowel gas pattern. No evidence of free air under the diaphragm on the upright view. Vascular stents overlying the upper abdomen. Additional right common iliac artery stent. Surgical clips overlying the left lower abdomen. Visualized osseous structures are within normal limits. IMPRESSION: No evidence of acute cardiopulmonary disease. No evidence of small bowel obstruction or free air. Electronically Signed   By: Julian Hy M.D.   On: 05/19/2019 03:03   US Abdomen Limited Ruq  Result Date: 05/19/2019 CLINICAL DATA:  Hepatitis. EXAM: ULTRASOUND ABDOMEN LIMITED RIGHT UPPER QUADRANT COMPARISON:  None. FINDINGS: Gallbladder: Sludge within the partially distended gallbladder. No wall thickening or focal tenderness. Common bile duct: Diameter: 5 mm.  Where visualized, no filling defect. Liver: Echogenic liver with small volume ascites. No evident mass. Portal vein is patent on color Doppler imaging with normal direction of blood flow towards the liver. Marked right renal atrophy. IMPRESSION: 1. Gallbladder sludge. 2. Hepatocellular disease. 3. Small ascites. 4. Marked right renal atrophy. Electronically Signed   By: Monte Fantasia M.D.   On: 05/19/2019 04:45      Impression / Plan:   Cannon Arreola is a 43 y.o. y/o male with no previous history of liver disease, or alcohol abuse, admitted after 2 weeks of nausea vomiting coffee-ground emesis, and found to be in acute liver failure with anemia and thrombocytopenia  Source of patient's acute liver failure is unknown at this time Acute hepatitis panel is pending  Ultrasound and CT has not shown any biliary duct obstruction  Alk phos is normal going against  biliary duct obstruction  Tylenol level is low  Portal vein is patent on ultrasound  Last episode of coffee-ground emesis was yesterday  Due to patient's coffee-ground emesis, anemia we have plan on upper endoscopy for further evaluation  However, anesthesia staff, Dr. Ronelle Nigh, Dr. Randa Lynn, who stated they talked to their entire anesthesia team, they do not feel comfortable sedating this patient at [name] and recommend transfer to tertiary care center.  I extensively discussed with them the  need for the procedure, including possibility of medical optimization with dialysis, platelet transfusion, vitamin K and INR correction, however, due to his acute liver failure, unless patient has active GI bleeding and needs to be done emergently, they do not recommend endoscopy at this facility at this time.  Therefore, I have discussed this with the primary team and they are planning on calling tertiary care centers to request transfer.  Patient will need further work-up for evaluating the etiology of his acute liver failure including viral and autoimmune hepatitis work-up, evaluation for Budd-Chiari syndrome with ultrasound liver Doppler and other studies as necessary.  Continue Protonix IV twice daily  Do not suspect variceal bleed as patient does not have any chronic history of cirrhosis   Avoid hepatotoxic drugs Continue daily CMP Continue serial CBCs  Please page GI on-call with any signs of active GI bleeding  Thank you for involving me in the care of this patient.      LOS: 0 days   Virgel Manifold, MD  05/19/2019, 3:59 PM

## 2019-05-19 NOTE — ED Triage Notes (Signed)
Patient is a T/Th/Sa dialysis  Patient. Patient with 500 ml coffee ground emesis in triage and residue on mouth. Patient had oral surgery done today, 7 teeth removed. Patient c/o epigastric pain described as heartburn. Patient c/o N/V X 1 week.

## 2019-05-19 NOTE — Progress Notes (Signed)
Established hemodialysis patient known at [facility] TTS 11:00, patient transports with [facility]. Please not any change in COVID or mobility may affect this plan, please contact me directly for any dialysis placement concerns.    [name] Dialysis Coordinator [phone]

## 2019-05-19 NOTE — Progress Notes (Signed)
Pharmacy Antibiotic Note  Brett Small is a 42 y.o. male admitted on 05/19/2019 with GI Bleed and is being given ceftriaxone for secondary prophylaxis for SBP s/t GI Bleed.Marland Kitchen  Pharmacy has been consulted for Ceftriaxone dosing.  Plan: Patient received ceftriaxone 1g IV x 1 in ED.  Patient had concurrent hepatic and renal dysfunction and will start him on ceftriaxone 2g daily for secondary SBP prophylaxis; however, will need to follow-up to schedule three-time weekly after dialysis.   Height: 5\' 1"  (154.9 cm) Weight: 104 lb 15 oz (47.6 kg) IBW/kg (Calculated) : 52.3  Temp (24hrs), Avg:97.7 F (36.5 C), Min:97.6 F (36.4 C), Max:97.7 F (36.5 C)  Recent Labs  Lab 05/19/19 0209  WBC 12.0*  CREATININE 7.01*    Estimated Creatinine Clearance: 9 mL/min (A) (by C-G formula based on SCr of 7.01 mg/dL (H)).    Allergies  Allergen Reactions  . Codeine Nausea Only    Patient questioned this (??)  . Sulfa Antibiotics Hives and Nausea And Vomiting    Thank you for allowing pharmacy to be a part of this patient's care.  Tobie Lords, PharmD, BCPS Clinical Pharmacist 05/19/2019 6:57 AM

## 2019-05-19 NOTE — ED Notes (Signed)
ED TO INPATIENT HANDOFF REPORT  ED Nurse Name and Phone #: Lorriane Shire [phone]  S Name/Age/Gender Brett Small 43 y.o. male Room/Bed: ED17A/ED17A  Code Status   Code Status: Prior  Home/SNF/Other Home Patient oriented to: self, place, time and situation Is this baseline? Yes   Triage Complete: Triage complete  Chief Complaint emesis  Triage Note Patient is a T/Th/Sa dialysis  Patient. Patient with 500 ml coffee ground emesis in triage and residue on mouth. Patient had oral surgery done today, 7 teeth removed. Patient c/o epigastric pain described as heartburn. Patient c/o N/V X 1 week.    Allergies Allergies  Allergen Reactions  . Codeine Nausea Only    Patient questioned this (??)  . Sulfa Antibiotics Hives and Nausea And Vomiting    Level of Care/Admitting Diagnosis ED Disposition    ED Disposition Condition [facility]: [place] [100120]  Level of Care: Med-Surg [16]  Covid Evaluation: Asymptomatic Screening Protocol (No Symptoms)  Diagnosis: Acute liver failure [759163]  Admitting Physician: Lance Coon [[id number]]  Attending Physician: Lance Coon [phone]  Estimated length of stay: past midnight tomorrow  Certification:: I certify this patient will need inpatient services for at least 2 midnights  PT Class (Do Not Modify): Inpatient [101]  PT Acc Code (Do Not Modify): Private [1]       B Medical/Surgery History Past Medical History:  Diagnosis Date  . Hypertension   . Renal disorder    Past Surgical History:  Procedure Laterality Date  . A/V SHUNT INTERVENTION Left 01/19/2019   Procedure: LEFT UPPER EXTREMITY A/V SHUNTOGRAM / UPPER EXTREMITY ANGIOGRAM;  Surgeon: Algernon Huxley, MD;  Location: [place] [facility];  Service: Cardiovascular;  Laterality: Left;  . A/V SHUNT INTERVENTION Left 03/02/2019   Procedure: A/V SHUNT INTERVENTION;  Surgeon: Algernon Huxley, MD;  Location: [place] [facility];  Service:  Cardiovascular;  Laterality: Left;  . AORTA - FEMORAL ARTERY BYPASS GRAFT    . AV FISTULA PLACEMENT Left 12/26/2018   Procedure: INSERTION OF GORE STRETCH VASCULAR 4-7MM X  45CM IN LEFT UPPER ARM;  Surgeon: Marty Heck, MD;  Location: [place];  Service: Vascular;  Laterality: Left;  . DIALYSIS/PERMA CATHETER REMOVAL N/A 02/06/2019   Procedure: DIALYSIS/PERMA CATHETER REMOVAL;  Surgeon: Algernon Huxley, MD;  Location: [place] [facility];  Service: Cardiovascular;  Laterality: N/A;     A IV Location/Drains/Wounds Patient Lines/Drains/Airways Status   Active Line/Drains/Airways    Name:   Placement date:   Placement time:   Site:   Days:   Peripheral IV 05/19/19 Right Antecubital   05/19/19    0230    Antecubital   less than 1   Peripheral IV 05/19/19 Right Wrist   05/19/19    0400    Wrist   less than 1   Fistula / Graft Left Upper arm Arteriovenous vein graft   12/26/18    1024    Upper arm   144          Intake/Output Last 24 hours No intake or output data in the 24 hours ending 05/19/19 0544  Labs/Imaging Results for orders placed or performed during the hospital encounter of 05/19/19 (from the past 48 hour(s))  Comprehensive metabolic panel     Status: Abnormal   Collection Time: 05/19/19  2:09 AM  Result Value Ref Range   Sodium 132 (L) 135 - 145 mmol/L    Comment: REPEATED ELECTROLYTES [facility]  Potassium 4.6 3.5 - 5.1 mmol/L   Chloride 88 (L) 98 - 111 mmol/L   CO2 13 (L) 22 - 32 mmol/L   Glucose, Bld 99 70 - 99 mg/dL   BUN 87 (H) 6 - 20 mg/dL   Creatinine, Ser 7.01 (H) 0.61 - 1.24 mg/dL   Calcium 8.2 (L) 8.9 - 10.3 mg/dL   Total Protein 5.8 (L) 6.5 - 8.1 g/dL   Albumin 3.1 (L) 3.5 - 5.0 g/dL   AST 379 (H) 15 - 41 U/L   ALT 679 (H) 0 - 44 U/L   Alkaline Phosphatase 95 38 - 126 U/L   Total Bilirubin 14.0 (H) 0.3 - 1.2 mg/dL   GFR calc non Af Amer 9 (L) >60 mL/min   GFR calc Af Amer 10 (L) >60 mL/min   Anion gap 31 (H) 5 - 15    Comment: Performed at [facility], [address] [place], [place] [address]  CBC     Status: Abnormal   Collection Time: 05/19/19  2:09 AM  Result Value Ref Range   WBC 12.0 (H) 4.0 - 10.5 K/uL   RBC 3.69 (L) 4.22 - 5.81 MIL/uL   Hemoglobin 11.9 (L) 13.0 - 17.0 g/dL   HCT 36.7 (L) 39.0 - 52.0 %   MCV 99.5 80.0 - 100.0 fL   MCH 32.2 26.0 - 34.0 pg   MCHC 32.4 30.0 - 36.0 g/dL   RDW 21.8 (H) 11.5 - 15.5 %   Platelets 28 (LL) 150 - 400 K/uL    Comment: REPEATED TO VERIFY PLATELET COUNT CONFIRMED BY SMEAR Immature Platelet Fraction may be clinically indicated, consider ordering this additional test [id number] THIS CRITICAL RESULT HAS VERIFIED AND BEEN CALLED TO Delisa Finck RN BY HANNAH MILES ON 07 28 2020 AT 0335, AND HAS BEEN READ BACK.     nRBC 0.0 0.0 - 0.2 %    Comment: Performed at [facility], [address] [place], [place] [address]  Type and screen [place]     Status: None (Preliminary result)   Collection Time: 05/19/19  2:09 AM  Result Value Ref Range   ABO/RH(D) PENDING    Antibody Screen PENDING    Sample Expiration      05/22/2019,2359 Performed at [facility], [address] [place], [place] [address]   Lipase, blood     Status: Abnormal   Collection Time: 05/19/19  2:09 AM  Result Value Ref Range   Lipase 79 (H) 11 - 51 U/L    Comment: Performed at [facility], [address] [place], [place] [address]  Type and screen Ordered by PROVIDER DEFAULT     Status: None   Collection Time: 05/19/19  3:10 AM  Result Value Ref Range   ABO/RH(D) AB POS    Antibody Screen NEG    Sample Expiration      05/22/2019,2359 Performed at [facility], [address] [place], [place] [address]   Protime-INR     Status: Abnormal   Collection Time: 05/19/19  3:28 AM  Result Value Ref Range   Prothrombin Time 44.9 (H) 11.4 - 15.2 seconds   INR 4.9 (HH) 0.8 - 1.2    Comment: REPEATED TO VERIFY CRITICAL RESULT CALLED TO, READ BACK BY  AND VERIFIED WITHMarcy Salvo RN 0413 05/19/2019 HNM (NOTE) INR goal varies based on device and disease states. Performed at [facility], [address], [place], [place] [address]   APTT  Status: Abnormal   Collection Time: 05/19/19  3:28 AM  Result Value Ref Range   aPTT 42 (H) 24 - 36 seconds    Comment:        IF BASELINE aPTT IS ELEVATED, SUGGEST PATIENT RISK ASSESSMENT BE USED TO DETERMINE APPROPRIATE ANTICOAGULANT THERAPY. Performed at [facility], [address] [place], [place] [address]    Dg Abdomen Acute W/chest  Result Date: 05/19/2019 CLINICAL DATA:  Hematemesis, epigastric abdominal pain EXAM: DG ABDOMEN ACUTE W/ 1V CHEST COMPARISON:  Chest radiographs dated 03/02/2019 FINDINGS: Lungs are clear.  No pleural effusion or pneumothorax. The heart is normal in size.  Left axillary vascular stent. Nonobstructive bowel gas pattern. No evidence of free air under the diaphragm on the upright view. Vascular stents overlying the upper abdomen. Additional right common iliac artery stent. Surgical clips overlying the left lower abdomen. Visualized osseous structures are within normal limits. IMPRESSION: No evidence of acute cardiopulmonary disease. No evidence of small bowel obstruction or free air. Electronically Signed   By: Julian Hy M.D.   On: 05/19/2019 03:03   US Abdomen Limited Ruq  Result Date: 05/19/2019 CLINICAL DATA:  Hepatitis. EXAM: ULTRASOUND ABDOMEN LIMITED RIGHT UPPER QUADRANT COMPARISON:  None. FINDINGS: Gallbladder: Sludge within the partially distended gallbladder. No wall thickening or focal tenderness. Common bile duct: Diameter: 5 mm.  Where visualized, no filling defect. Liver: Echogenic liver with small volume ascites. No evident mass. Portal vein is patent on color Doppler imaging with normal direction of blood flow towards the liver. Marked right renal atrophy. IMPRESSION: 1. Gallbladder sludge. 2. Hepatocellular disease. 3.  Small ascites. 4. Marked right renal atrophy. Electronically Signed   By: Monte Fantasia M.D.   On: 05/19/2019 04:45    Pending Labs Unresulted Labs (From admission, onward)    Start     Ordered   05/19/19 0533  Technologist smear review  Add-on,   AD    Comments: Please evaluate for schistocytes    05/19/19 0532   05/19/19 0530  Culture, blood (Routine X 2) w Reflex to ID Panel  BLOOD CULTURE X 2,   STAT     05/19/19 0529   05/19/19 0520  SARS Coronavirus 2 (CEPHEID - Performed in [place] hospital lab), Hosp Order  (Asymptomatic Patients Labs)  ONCE - STAT,   STAT    Question:  Rule Out  Answer:  Yes   05/19/19 0519   05/19/19 0430  Acetaminophen level  Once,   R     05/19/19 0430   05/19/19 0430  Hepatitis panel, acute  Once,   R     05/19/19 0430          Vitals/Pain Today's Vitals   05/19/19 0150 05/19/19 0151  BP: (!) 162/92   Pulse: 98   Resp: 18   Temp: 97.7 F (36.5 C)   SpO2: 100%   Weight:  45.4 kg  Height:  1' (0.305 m)  PainSc:  7     Isolation Precautions No active isolations  Medications Medications  pantoprazole (PROTONIX) 80 mg in sodium chloride 0.9 % 250 mL (0.32 mg/mL) infusion (8 mg/hr Intravenous New Bag/Given 05/19/19 0331)  pantoprazole (PROTONIX) injection 40 mg (has no administration in time range)  octreotide (SANDOSTATIN) 2 mcg/mL load via infusion 50 mcg (50 mcg Intravenous Bolus from Bag 05/19/19 0439)    And  octreotide (SANDOSTATIN) 500 mcg in sodium chloride 0.9 % 250 mL (2 mcg/mL) infusion (50 mcg/hr Intravenous New Bag/Given 05/19/19 0439)  pantoprazole (PROTONIX) 80 mg in sodium chloride 0.9 % 100 mL IVPB (0 mg Intravenous Stopped 05/19/19 0317)  ondansetron (ZOFRAN) injection 4 mg (4 mg Intravenous Given 05/19/19 0252)  cefTRIAXone (ROCEPHIN) 1 g in sodium chloride 0.9 % 100 mL IVPB (0 g Intravenous Stopped 05/19/19 0529)    Mobility walks with person assist Low fall risk   Focused Assessments Renal Assessment  Handoff:  Hemodialysis Schedule:  Last Hemodialysis date and time: Saturday   Restricted appendage: left arm     R Recommendations: See Admitting Provider Note  Report given to:   Additional Notes:

## 2019-05-19 NOTE — ED Notes (Signed)
Patient transported to Ultrasound 

## 2019-05-19 NOTE — Progress Notes (Signed)
Patient c/o 6/10 mouth pain. States he had 7 teeth pulled on 7/27. I paged hospitalist and Dr Leslye Peer returned my page and gave order for tramadol 50 mg po q 6 hrs prn.

## 2019-05-20 DIAGNOSIS — D689 Coagulation defect, unspecified: Secondary | ICD-10-CM

## 2019-05-20 LAB — COMPREHENSIVE METABOLIC PANEL
ALT: 438 U/L — ABNORMAL HIGH (ref 0–44)
AST: 240 U/L — ABNORMAL HIGH (ref 15–41)
Albumin: 2.5 g/dL — ABNORMAL LOW (ref 3.5–5.0)
Alkaline Phosphatase: 81 U/L (ref 38–126)
Anion gap: 15 (ref 5–15)
BUN: 48 mg/dL — ABNORMAL HIGH (ref 6–20)
CO2: 22 mmol/L (ref 22–32)
Calcium: 7.8 mg/dL — ABNORMAL LOW (ref 8.9–10.3)
Chloride: 99 mmol/L (ref 98–111)
Creatinine, Ser: 4.1 mg/dL — ABNORMAL HIGH (ref 0.61–1.24)
GFR calc Af Amer: 19 mL/min — ABNORMAL LOW (ref >60)
GFR calc non Af Amer: 16 mL/min — ABNORMAL LOW (ref >60)
Glucose, Bld: 125 mg/dL — ABNORMAL HIGH (ref 70–99)
Potassium: 4.3 mmol/L (ref 3.5–5.1)
Sodium: 136 mmol/L (ref 135–145)
Total Bilirubin: 15 mg/dL — ABNORMAL HIGH (ref 0.3–1.2)
Total Protein: 5.2 g/dL — ABNORMAL LOW (ref 6.5–8.1)

## 2019-05-20 LAB — CBC WITH DIFFERENTIAL/PLATELET
Abs Immature Granulocytes: 0.04 10*3/uL (ref 0.00–0.07)
Basophils Absolute: 0 10*3/uL (ref 0.0–0.1)
Basophils Relative: 0 %
Eosinophils Absolute: 0 10*3/uL (ref 0.0–0.5)
Eosinophils Relative: 0 %
HCT: 33.3 % — ABNORMAL LOW (ref 39.0–52.0)
Hemoglobin: 11.1 g/dL — ABNORMAL LOW (ref 13.0–17.0)
Immature Granulocytes: 1 %
Lymphocytes Relative: 9 %
Lymphs Abs: 0.7 10*3/uL (ref 0.7–4.0)
MCH: 32.7 pg (ref 26.0–34.0)
MCHC: 33.3 g/dL (ref 30.0–36.0)
MCV: 98.2 fL (ref 80.0–100.0)
Monocytes Absolute: 0.4 10*3/uL (ref 0.1–1.0)
Monocytes Relative: 5 %
Neutro Abs: 7.1 10*3/uL (ref 1.7–7.7)
Neutrophils Relative %: 85 %
Platelets: 35 10*3/uL — ABNORMAL LOW (ref 150–400)
RBC: 3.39 MIL/uL — ABNORMAL LOW (ref 4.22–5.81)
RDW: 22.5 % — ABNORMAL HIGH (ref 11.5–15.5)
Smear Review: DECREASED
WBC: 8.3 10*3/uL (ref 4.0–10.5)
nRBC: 0 % (ref 0.0–0.2)

## 2019-05-20 LAB — HEPATITIS PANEL, ACUTE
HCV Ab: <0.1 s/co ratio (ref 0.0–0.9)
Hep A IgM: NEGATIVE
Hep B C IgM: NEGATIVE
Hepatitis B Surface Ag: NEGATIVE

## 2019-05-20 LAB — PROTIME-INR
INR: 2.7 — ABNORMAL HIGH (ref 0.8–1.2)
Prothrombin Time: 28.1 seconds — ABNORMAL HIGH (ref 11.4–15.2)

## 2019-05-20 LAB — HAPTOGLOBIN: Haptoglobin: 24 mg/dL (ref 23–355)

## 2019-05-20 MED ORDER — VITAMIN K1 10 MG/ML IJ SOLN
5.0000 mg | Freq: Once | INTRAMUSCULAR | Status: DC
  Filled 2019-05-20: qty 0.5

## 2019-05-20 MED ORDER — VITAMIN K1 10 MG/ML IJ SOLN
5.0000 mg | Freq: Once | INTRAVENOUS | Status: AC
  Administered 2019-05-20: 5 mg via INTRAVENOUS
  Filled 2019-05-20: qty 0.5

## 2019-05-20 MED ORDER — SODIUM CHLORIDE 0.9 % IV SOLN
INTRAVENOUS | Status: DC | PRN
  Administered 2019-05-20 – 2019-05-22 (×2): 250 mL via INTRAVENOUS

## 2019-05-20 MED ORDER — PROMETHAZINE HCL 25 MG/ML IJ SOLN
12.5000 mg | Freq: Four times a day (QID) | INTRAMUSCULAR | Status: DC | PRN
  Administered 2019-05-20 – 2019-05-22 (×2): 12.5 mg via INTRAVENOUS
  Filled 2019-05-20 (×2): qty 1

## 2019-05-20 MED ORDER — SODIUM CHLORIDE 0.9 % IV SOLN
2.0000 g | INTRAVENOUS | Status: DC
  Administered 2019-05-21: 2 g via INTRAVENOUS
  Filled 2019-05-20: qty 2

## 2019-05-20 NOTE — Progress Notes (Signed)
Brett Antigua, MD [address] [place], [place], [place], [address] [address], [place], [place], [place], [address] Phone: [phone]  Fax: [phone]   Subjective: Patient has been accepted to [facility] and is awaiting transfer.  Has not had any episodes of emesis in over 24 hours.  Denies any abdominal pain.  Does report dark stool yesterday.  Hemoglobin stable   Objective: Exam: Vital signs in last 24 hours: Vitals:   05/19/19 2045 05/19/19 2107 05/20/19 0434 05/20/19 1225  BP: (!) 155/92 (!) 153/95 (!) 151/99 (!) 148/100  Pulse: 96 95 93 96  Resp: 20 20 20 20   Temp: 98.5 F (36.9 C) 98.4 F (36.9 C) 97.9 F (36.6 C) (!) 96.3 F (35.7 C)  TempSrc: Oral Oral Oral Axillary  SpO2: 95% 93% 97% 97%  Weight:      Height:       Weight change:   Intake/Output Summary (Last 24 hours) at 05/20/2019 1508 Last data filed at 05/20/2019 1330 Gross per 24 hour  Intake 1999.31 ml  Output 2000 ml  Net -0.69 ml    General: No acute distress, AAO x3 Abd: Soft, NT/ND, No HSM Skin: Warm, no rashes Neck: Supple, Trachea midline   Lab Results: Lab Results  Component Value Date   WBC 8.3 05/20/2019   HGB 11.1 (L) 05/20/2019   HCT 33.3 (L) 05/20/2019   MCV 98.2 05/20/2019   PLT 35 (L) 05/20/2019   Micro Results: Recent Results (from the past 240 hour(s))  SARS Coronavirus 2 (CEPHEID - Performed in [place] hospital lab), Hosp Order     Status: None   Collection Time: 05/19/19  5:30 AM   Specimen: Nasopharyngeal Swab  Result Value Ref Range Status   SARS Coronavirus 2 NEGATIVE NEGATIVE Final    Comment: (NOTE) If result is NEGATIVE SARS-CoV-2 target nucleic acids are NOT DETECTED. The SARS-CoV-2 RNA is generally detectable in upper and lower  respiratory specimens during the acute phase of infection. The lowest  concentration of SARS-CoV-2 viral copies this assay can detect is 250  copies / mL. A negative result does not preclude SARS-CoV-2 infection  and  should not be used as the sole basis for treatment or other  patient management decisions.  A negative result may occur with  improper specimen collection / handling, submission of specimen other  than nasopharyngeal swab, presence of viral mutation(s) within the  areas targeted by this assay, and inadequate number of viral copies  (<250 copies / mL). A negative result must be combined with clinical  observations, patient history, and epidemiological information. If result is POSITIVE SARS-CoV-2 target nucleic acids are DETECTED. The SARS-CoV-2 RNA is generally detectable in upper and lower  respiratory specimens dur ing the acute phase of infection.  Positive  results are indicative of active infection with SARS-CoV-2.  Clinical  correlation with patient history and other diagnostic information is  necessary to determine patient infection status.  Positive results do  not rule out bacterial infection or co-infection with other viruses. If result is PRESUMPTIVE POSTIVE SARS-CoV-2 nucleic acids MAY BE PRESENT.   A presumptive positive result was obtained on the submitted specimen  and confirmed on repeat testing.  While 2019 novel coronavirus  (SARS-CoV-2) nucleic acids may be present in the submitted sample  additional confirmatory testing may be necessary for epidemiological  and / or clinical management purposes  to differentiate between  SARS-CoV-2 and other Sarbecovirus currently known to infect humans.  If clinically indicated additional testing  with an alternate test  methodology [id number]) is advised. The SARS-CoV-2 RNA is generally  detectable in upper and lower respiratory sp ecimens during the acute  phase of infection. The expected result is Negative. Fact Sheet for Patients:  StrictlyIdeas.no Fact Sheet for Healthcare Providers: BankingDealers.co.za This test is not yet approved or cleared by the [place] FDA and has been  authorized for detection and/or diagnosis of SARS-CoV-2 by FDA under an Emergency Use Authorization (EUA).  This EUA will remain in effect (meaning this test can be used) for the duration of the COVID-19 declaration under Section 564(b)(1) of the Act, 21 U.S.C. section 360bbb-3(b)(1), unless the authorization is terminated or revoked sooner. Performed at [facility], [address] [place], [place] [address]   MRSA PCR Screening     Status: None   Collection Time: 05/19/19  6:15 AM   Specimen: Nasal Mucosa; Nasopharyngeal  Result Value Ref Range Status   MRSA by PCR NEGATIVE NEGATIVE Final    Comment:        The GeneXpert MRSA Assay (FDA approved for NASAL specimens only), is one component of a comprehensive MRSA colonization surveillance program. It is not intended to diagnose MRSA infection nor to guide or monitor treatment for MRSA infections. Performed at [facility], [address] [place], [place] [address]   Culture, blood (Routine X 2) w Reflex to ID Panel     Status: None (Preliminary result)   Collection Time: 05/19/19  7:08 AM   Specimen: BLOOD  Result Value Ref Range Status   Specimen Description BLOOD RIGHT HAND  Final   Special Requests   Final    BOTTLES DRAWN AEROBIC AND ANAEROBIC Blood Culture results may not be optimal due to an excessive volume of blood received in culture bottles   Culture   Final    NO GROWTH < 24 HOURS Performed at [facility], [address], [place], [place] [address]    Report Status PENDING  Incomplete  Culture, blood (Routine X 2) w Reflex to ID Panel     Status: None (Preliminary result)   Collection Time: 05/19/19  8:48 AM   Specimen: BLOOD  Result Value Ref Range Status   Specimen Description BLOOD RIGHT HAND  Final   Special Requests   Final    BOTTLES DRAWN AEROBIC ONLY Blood Culture results may not be optimal due to an excessive volume of blood received in culture bottles   Culture    Final    NO GROWTH < 24 HOURS Performed at [facility], [address] [place], [place] [address]    Report Status PENDING  Incomplete   Studies/Results: Ct Abdomen Pelvis Wo Contrast  Result Date: 05/19/2019 CLINICAL DATA:  Liver failure, hematemesis EXAM: CT ABDOMEN AND PELVIS WITHOUT CONTRAST TECHNIQUE: Multidetector CT imaging of the abdomen and pelvis was performed following the standard protocol without IV contrast. Oral enteric contrast was administered. COMPARISON:  None. FINDINGS: Lower chest: Small pericardial effusion. Hepatobiliary: No solid liver abnormality is seen. No gallstones, gallbladder wall thickening, or biliary dilatation. Pancreas: Unremarkable. No pancreatic ductal dilatation or surrounding inflammatory changes. Spleen: Normal in size without significant abnormality. Adrenals/Urinary Tract: Adrenal glands are unremarkable. Atrophic kidneys. Bladder is unremarkable. Stomach/Bowel: Stomach is within normal limits. Appendix appears normal. No evidence of bowel wall thickening, distention, or inflammatory changes. Vascular/Lymphatic: There is complex multi component aortobifemoral graft bypass. No enlarged abdominal or pelvic lymph nodes. Reproductive: No mass or other significant abnormality. Other: No abdominal  wall hernia or abnormality. Small volume ascites. Musculoskeletal: No acute or significant osseous findings. IMPRESSION: 1.  Small volume ascites. 2.  Aortobifemoral graft bypass. 3.  Atrophic kidneys. 4.  Pericardial effusion. Electronically Signed   By: Eddie Candle M.D.   On: 05/19/2019 12:30   Dg Abdomen Acute W/chest  Result Date: 05/19/2019 CLINICAL DATA:  Hematemesis, epigastric abdominal pain EXAM: DG ABDOMEN ACUTE W/ 1V CHEST COMPARISON:  Chest radiographs dated 03/02/2019 FINDINGS: Lungs are clear.  No pleural effusion or pneumothorax. The heart is normal in size.  Left axillary vascular stent. Nonobstructive bowel gas pattern. No evidence of free air  under the diaphragm on the upright view. Vascular stents overlying the upper abdomen. Additional right common iliac artery stent. Surgical clips overlying the left lower abdomen. Visualized osseous structures are within normal limits. IMPRESSION: No evidence of acute cardiopulmonary disease. No evidence of small bowel obstruction or free air. Electronically Signed   By: Julian Hy M.D.   On: 05/19/2019 03:03   US Abdomen Limited Ruq  Result Date: 05/19/2019 CLINICAL DATA:  Hepatitis. EXAM: ULTRASOUND ABDOMEN LIMITED RIGHT UPPER QUADRANT COMPARISON:  None. FINDINGS: Gallbladder: Sludge within the partially distended gallbladder. No wall thickening or focal tenderness. Common bile duct: Diameter: 5 mm.  Where visualized, no filling defect. Liver: Echogenic liver with small volume ascites. No evident mass. Portal vein is patent on color Doppler imaging with normal direction of blood flow towards the liver. Marked right renal atrophy. IMPRESSION: 1. Gallbladder sludge. 2. Hepatocellular disease. 3. Small ascites. 4. Marked right renal atrophy. Electronically Signed   By: Monte Fantasia M.D.   On: 05/19/2019 04:45   Medications:  Scheduled Meds: . [START ON 05/22/2019] pantoprazole  40 mg Intravenous Q12H   Continuous Infusions: . sodium chloride Stopped (05/20/19 0604)  . cefTRIAXone (ROCEPHIN)  IV Stopped (05/20/19 0559)  . octreotide  (SANDOSTATIN)    IV infusion Stopped (05/20/19 0941)  . pantoprozole (PROTONIX) infusion Stopped (05/20/19 0941)  . sodium chloride     PRN Meds:.sodium chloride, ondansetron **OR** ondansetron (ZOFRAN) IV, traMADol   Assessment: Principal Problem:   Acute liver failure Active Problems:   End stage renal disease on dialysis (HCC)   Hypertension   Hematemesis   Hepatitis   Thrombocytopenia (HCC)   Coagulopathy (HCC)    Plan: Etiology of acute liver failure still unknown Liver Doppler ordered and pending Acute hepatitis panel was negative Further  blood work for viral and autoimmune hepatitis work-up including HSV and CMV DNA PCR ordered and pending  No episodes of active GI bleeding  Continue serial CBCs and transfuse.  Continue PPI IV twice daily  Liver enzymes are improving, so are INR and platelet count  Continue to avoid hepatotoxic drugs  If patient does not get transferred to [facility] based on availability bed, can rediscuss with anesthesia since labs are improved if they would be willing to proceed with endoscopy in the next 1 to 2 days.  Continue Vit K 10mg  IV as needed until INR improves    LOS: 1 day   Brett Antigua, MD 05/20/2019, 3:08 PM

## 2019-05-20 NOTE — Progress Notes (Signed)
[facility]  ROUNDING NOTE   Subjective:   Hemodialysis treatment yesterday. Tolerated treatment well. UF of 2 liters.   Patient is being transferred to [facility] of nausea and diarrhea  Objective:  Vital signs in last 24 hours:  Temp:  [96.3 F (35.7 C)-98.5 F (36.9 C)] 96.3 F (35.7 C) (07/29 1225) Pulse Rate:  [81-96] 96 (07/29 1225) Resp:  [16-20] 20 (07/29 1225) BP: (134-159)/(76-113) 148/100 (07/29 1225) SpO2:  [93 %-100 %] 97 % (07/29 1225)  Weight change:  Filed Weights   05/19/19 0151 05/19/19 0616  Weight: 45.4 kg 47.6 kg    Intake/Output: I/O last 3 completed shifts: In: 1459.9 [P.O.:238; I.V.:1121.9; IV Piggyback:100] Out: 2000 [Other:2000]   Intake/Output this shift:  Total I/O In: 299.4 [P.O.:120; I.V.:179.4] Out: -   Physical Exam: General: NAD, laying in bed  Head: Normocephalic, atraumatic. Moist oral mucosal membranes  Eyes: +icteric sclera  Neck: Supple   Lungs:  Clear to auscultation  Heart: Regular rate and rhythm  Abdomen:  Soft, nontender  Extremities: no peripheral edema.  Neurologic: +tremor  Skin: +jaundice  Access: Left AVG    Basic Metabolic Panel: Recent Labs  Lab 05/19/19 0209 05/19/19 0708 05/20/19 0644  NA 132* 133* 136  K 4.6 4.8 4.3  CL 88* 90* 99  CO2 13* 17* 22  GLUCOSE 99 114* 125*  BUN 87* 98* 48*  CREATININE 7.01* 7.32* 4.10*  CALCIUM 8.2* 7.8* 7.8*    Liver Function Tests: Recent Labs  Lab 05/19/19 0209 05/19/19 0708 05/20/19 0644  AST 379* 380* 240*  ALT 679* 607* 438*  ALKPHOS 95 87 81  BILITOT 14.0* 12.7* 15.0*  PROT 5.8* 5.2* 5.2*  ALBUMIN 3.1* 2.7* 2.5*   Recent Labs  Lab 05/19/19 0209  LIPASE 79*   No results for input(s): AMMONIA in the last 168 hours.  CBC: Recent Labs  Lab 05/19/19 0209 05/19/19 0708 05/20/19 0644  WBC 12.0* 8.8 8.3  NEUTROABS  --  7.4 7.1  HGB 11.9* 10.3* 11.1*  HCT 36.7* 31.1* 33.3*  MCV 99.5 98.4 98.2  PLT  28* 24* 35*    Cardiac Enzymes: No results for input(s): CKTOTAL, CKMB, CKMBINDEX, TROPONINI in the last 168 hours.  BNP: Invalid input(s): POCBNP  CBG: No results for input(s): GLUCAP in the last 168 hours.  Microbiology: Results for orders placed or performed during the hospital encounter of 05/19/19  SARS Coronavirus 2 (CEPHEID - Performed in [facility] hospital lab), Hosp Order     Status: None   Collection Time: 05/19/19  5:30 AM   Specimen: Nasopharyngeal Swab  Result Value Ref Range Status   SARS Coronavirus 2 NEGATIVE NEGATIVE Final    Comment: (NOTE) If result is NEGATIVE SARS-CoV-2 target nucleic acids are NOT DETECTED. The SARS-CoV-2 RNA is generally detectable in upper and lower  respiratory specimens during the acute phase of infection. The lowest  concentration of SARS-CoV-2 viral copies this assay can detect is 250  copies / mL. A negative result does not preclude SARS-CoV-2 infection  and should not be used as the sole basis for treatment or other  patient management decisions.  A negative result may occur with  improper specimen collection / handling, submission of specimen other  than nasopharyngeal swab, presence of viral mutation(s) within the  areas targeted by this assay, and inadequate number of viral copies  (<250 copies / mL). A negative result must be combined with clinical  observations, patient history, and epidemiological  information. If result is POSITIVE SARS-CoV-2 target nucleic acids are DETECTED. The SARS-CoV-2 RNA is generally detectable in upper and lower  respiratory specimens dur ing the acute phase of infection.  Positive  results are indicative of active infection with SARS-CoV-2.  Clinical  correlation with patient history and other diagnostic information is  necessary to determine patient infection status.  Positive results do  not rule out bacterial infection or co-infection with other viruses. If result is PRESUMPTIVE  POSTIVE SARS-CoV-2 nucleic acids MAY BE PRESENT.   A presumptive positive result was obtained on the submitted specimen  and confirmed on repeat testing.  While 2019 novel coronavirus  (SARS-CoV-2) nucleic acids may be present in the submitted sample  additional confirmatory testing may be necessary for epidemiological  and / or clinical management purposes  to differentiate between  SARS-CoV-2 and other Sarbecovirus currently known to infect humans.  If clinically indicated additional testing with an alternate test  methodology [phone]) is advised. The SARS-CoV-2 RNA is generally  detectable in upper and lower respiratory sp ecimens during the acute  phase of infection. The expected result is Negative. Fact Sheet for Patients:  StrictlyIdeas.no Fact Sheet for Healthcare Providers: BankingDealers.co.za This test is not yet approved or cleared by the [place] FDA and has been authorized for detection and/or diagnosis of SARS-CoV-2 by FDA under an Emergency Use Authorization (EUA).  This EUA will remain in effect (meaning this test can be used) for the duration of the COVID-19 declaration under Section 564(b)(1) of the Act, 21 U.S.C. section 360bbb-3(b)(1), unless the authorization is terminated or revoked sooner. Performed at [facility], [address] [place], [place] [address]   MRSA PCR Screening     Status: None   Collection Time: 05/19/19  6:15 AM   Specimen: Nasal Mucosa; Nasopharyngeal  Result Value Ref Range Status   MRSA by PCR NEGATIVE NEGATIVE Final    Comment:        The GeneXpert MRSA Assay (FDA approved for NASAL specimens only), is one component of a comprehensive MRSA colonization surveillance program. It is not intended to diagnose MRSA infection nor to guide or monitor treatment for MRSA infections. Performed at [facility], [address] [place], [place] [address]   Culture,  blood (Routine X 2) w Reflex to ID Panel     Status: None (Preliminary result)   Collection Time: 05/19/19  7:08 AM   Specimen: BLOOD  Result Value Ref Range Status   Specimen Description BLOOD RIGHT HAND  Final   Special Requests   Final    BOTTLES DRAWN AEROBIC AND ANAEROBIC Blood Culture results may not be optimal due to an excessive volume of blood received in culture bottles   Culture   Final    NO GROWTH < 24 HOURS Performed at [facility], [address], [place], [place] [address]    Report Status PENDING  Incomplete  Culture, blood (Routine X 2) w Reflex to ID Panel     Status: None (Preliminary result)   Collection Time: 05/19/19  8:48 AM   Specimen: BLOOD  Result Value Ref Range Status   Specimen Description BLOOD RIGHT HAND  Final   Special Requests   Final    BOTTLES DRAWN AEROBIC ONLY Blood Culture results may not be optimal due to an excessive volume of blood received in culture bottles   Culture   Final    NO GROWTH < 24 HOURS Performed at [facility], [address] [place], [place] [address]    Report Status PENDING  Incomplete    Coagulation Studies: Recent Labs    05/19/19 0328 05/20/19 0644  LABPROT 44.9* 28.1*  INR 4.9* 2.7*    Urinalysis: No results for input(s): COLORURINE, LABSPEC, PHURINE, GLUCOSEU, HGBUR, BILIRUBINUR, KETONESUR, PROTEINUR, UROBILINOGEN, NITRITE, LEUKOCYTESUR in the last 72 hours.  Invalid input(s): APPERANCEUR    Imaging: Ct Abdomen Pelvis Wo Contrast  Result Date: 05/19/2019 CLINICAL DATA:  Liver failure, hematemesis EXAM: CT ABDOMEN AND PELVIS WITHOUT CONTRAST TECHNIQUE: Multidetector CT imaging of the abdomen and pelvis was performed following the standard protocol without IV contrast. Oral enteric contrast was administered. COMPARISON:  None. FINDINGS: Lower chest: Small pericardial effusion. Hepatobiliary: No solid liver abnormality is seen. No gallstones, gallbladder wall thickening, or biliary  dilatation. Pancreas: Unremarkable. No pancreatic ductal dilatation or surrounding inflammatory changes. Spleen: Normal in size without significant abnormality. Adrenals/Urinary Tract: Adrenal glands are unremarkable. Atrophic kidneys. Bladder is unremarkable. Stomach/Bowel: Stomach is within normal limits. Appendix appears normal. No evidence of bowel wall thickening, distention, or inflammatory changes. Vascular/Lymphatic: There is complex multi component aortobifemoral graft bypass. No enlarged abdominal or pelvic lymph nodes. Reproductive: No mass or other significant abnormality. Other: No abdominal wall hernia or abnormality. Small volume ascites. Musculoskeletal: No acute or significant osseous findings. IMPRESSION: 1.  Small volume ascites. 2.  Aortobifemoral graft bypass. 3.  Atrophic kidneys. 4.  Pericardial effusion. Electronically Signed   By: Eddie Candle M.D.   On: 05/19/2019 12:30   Dg Abdomen Acute W/chest  Result Date: 05/19/2019 CLINICAL DATA:  Hematemesis, epigastric abdominal pain EXAM: DG ABDOMEN ACUTE W/ 1V CHEST COMPARISON:  Chest radiographs dated 03/02/2019 FINDINGS: Lungs are clear.  No pleural effusion or pneumothorax. The heart is normal in size.  Left axillary vascular stent. Nonobstructive bowel gas pattern. No evidence of free air under the diaphragm on the upright view. Vascular stents overlying the upper abdomen. Additional right common iliac artery stent. Surgical clips overlying the left lower abdomen. Visualized osseous structures are within normal limits. IMPRESSION: No evidence of acute cardiopulmonary disease. No evidence of small bowel obstruction or free air. Electronically Signed   By: Julian Hy M.D.   On: 05/19/2019 03:03   US Abdomen Limited Ruq  Result Date: 05/19/2019 CLINICAL DATA:  Hepatitis. EXAM: ULTRASOUND ABDOMEN LIMITED RIGHT UPPER QUADRANT COMPARISON:  None. FINDINGS: Gallbladder: Sludge within the partially distended gallbladder. No wall  thickening or focal tenderness. Common bile duct: Diameter: 5 mm.  Where visualized, no filling defect. Liver: Echogenic liver with small volume ascites. No evident mass. Portal vein is patent on color Doppler imaging with normal direction of blood flow towards the liver. Marked right renal atrophy. IMPRESSION: 1. Gallbladder sludge. 2. Hepatocellular disease. 3. Small ascites. 4. Marked right renal atrophy. Electronically Signed   By: Monte Fantasia M.D.   On: 05/19/2019 04:45     Medications:   . sodium chloride Stopped (05/20/19 0604)  . cefTRIAXone (ROCEPHIN)  IV Stopped (05/20/19 0559)  . octreotide  (SANDOSTATIN)    IV infusion Stopped (05/20/19 0941)  . pantoprozole (PROTONIX) infusion Stopped (05/20/19 0941)  . sodium chloride     . [START ON 05/22/2019] pantoprazole  40 mg Intravenous Q12H   sodium chloride, ondansetron **OR** ondansetron (ZOFRAN) IV, traMADol  Assessment/ Plan:  [name] is a 43 y.o. white male with end stage renal disease on hemodialysis, hypertension, peripheral vascular disease, depression who is admitted to [facility] on 05/19/2019 for Hepatitis [K75.9] Hematemesis with nausea [K92.0]  Acute liver failure without hepatic coma [K72.00]  [place]. TTS 48kg Left AVG  1. End Stage Renal Disease: tolerated treatment well.  - Continue TTS schedule  2. Hypertension: holding blood pressure medications. Home regimen of amlodipine, carvedilol, and hydralazine.  3. Anemia of chronic kidney disease:   - EPO with HD treatment.   4. Secondary Hyperparathyroidism: outpatient labs on 7/16 PTH 929, phosphorus 3.9, calcium 8.4.  - sevelamer with meals once restart PO diet. Currently NPO  5. Acute liver failure with jaundice: new presentation. Patient denies alcohol use. Denies IVDA Acute viral hepatitis panel negative this admission in addition to being negative in March, April and May.  RUQ ultrasound with gallbladder sludge.     LOS: 1 Draiden Mirsky 7/29/20201:11 PM

## 2019-05-20 NOTE — Progress Notes (Signed)
[place] [facility] [place] NAME: Brett Small    MR#:  [id number]  DATE OF BIRTH:  05-Feb-1976  SUBJECTIVE:  CHIEF COMPLAINT:   Chief Complaint  Patient presents with  . Hematemesis   No further vomiting. 1 episode of dark stool  REVIEW OF SYSTEMS:    Review of Systems  Constitutional: Positive for malaise/fatigue. Negative for chills and fever.  HENT: Negative for sore throat.   Eyes: Negative for blurred vision, double vision and pain.  Respiratory: Negative for cough, hemoptysis, shortness of breath and wheezing.   Cardiovascular: Negative for chest pain, palpitations, orthopnea and leg swelling.  Gastrointestinal: Negative for abdominal pain, constipation, diarrhea, heartburn, nausea and vomiting.  Genitourinary: Negative for dysuria and hematuria.  Musculoskeletal: Negative for back pain and joint pain.  Skin: Negative for rash.  Neurological: Negative for sensory change, speech change, focal weakness and headaches.  Endo/Heme/Allergies: Does not bruise/bleed easily.  Psychiatric/Behavioral: Negative for depression. The patient is not nervous/anxious.     DRUG ALLERGIES:   Allergies  Allergen Reactions  . Codeine Nausea Only    Patient questioned this (??)  . Sulfa Antibiotics Hives and Nausea And Vomiting    VITALS:  Blood pressure (!) 151/99, pulse 93, temperature 97.9 F (36.6 C), temperature source Oral, resp. rate 20, height 5\' 1"  (1.549 m), weight 47.6 kg, SpO2 97 %.  PHYSICAL EXAMINATION:   Physical Exam  GENERAL:  43 y.o.-year-old patient lying in the bed with no acute distress.  EYES: Pupils equal, round, reactive to light and accommodation. No scleral icterus. Extraocular muscles intact.  HEENT: Head atraumatic, normocephalic. Oropharynx and nasopharynx clear.  NECK:  Supple, no jugular venous distention. No thyroid enlargement, no tenderness.  LUNGS: Normal breath sounds bilaterally, no wheezing, rales, rhonchi.  No use of accessory muscles of respiration.  CARDIOVASCULAR: S1, S2 normal. No murmurs, rubs, or gallops.  ABDOMEN: Soft, nontender, nondistended. Bowel sounds present. No organomegaly or mass.  EXTREMITIES: No cyanosis, clubbing or edema b/l.    NEUROLOGIC: Cranial nerves II through XII are intact. No focal Motor or sensory deficits b/l.   PSYCHIATRIC: The patient is alert and oriented x 3.  SKIN: No obvious rash, lesion, or ulcer.   LABORATORY PANEL:   CBC Recent Labs  Lab 05/20/19 0644  WBC 8.3  HGB 11.1*  HCT 33.3*  PLT 35*   ------------------------------------------------------------------------------------------------------------------ Chemistries  Recent Labs  Lab 05/20/19 0644  NA 136  K 4.3  CL 99  CO2 22  GLUCOSE 125*  BUN 48*  CREATININE 4.10*  CALCIUM 7.8*  AST 240*  ALT 438*  ALKPHOS 81  BILITOT 15.0*   ------------------------------------------------------------------------------------------------------------------  Cardiac Enzymes No results for input(s): TROPONINI in the last 168 hours. ------------------------------------------------------------------------------------------------------------------  RADIOLOGY:  Ct Abdomen Pelvis Wo Contrast  Result Date: 05/19/2019 CLINICAL DATA:  Liver failure, hematemesis EXAM: CT ABDOMEN AND PELVIS WITHOUT CONTRAST TECHNIQUE: Multidetector CT imaging of the abdomen and pelvis was performed following the standard protocol without IV contrast. Oral enteric contrast was administered. COMPARISON:  None. FINDINGS: Lower chest: Small pericardial effusion. Hepatobiliary: No solid liver abnormality is seen. No gallstones, gallbladder wall thickening, or biliary dilatation. Pancreas: Unremarkable. No pancreatic ductal dilatation or surrounding inflammatory changes. Spleen: Normal in size without significant abnormality. Adrenals/Urinary Tract: Adrenal glands are unremarkable. Atrophic kidneys. Bladder is unremarkable.  Stomach/Bowel: Stomach is within normal limits. Appendix appears normal. No evidence of bowel wall thickening, distention, or inflammatory changes. Vascular/Lymphatic: There is complex multi component aortobifemoral graft  bypass. No enlarged abdominal or pelvic lymph nodes. Reproductive: No mass or other significant abnormality. Other: No abdominal wall hernia or abnormality. Small volume ascites. Musculoskeletal: No acute or significant osseous findings. IMPRESSION: 1.  Small volume ascites. 2.  Aortobifemoral graft bypass. 3.  Atrophic kidneys. 4.  Pericardial effusion. Electronically Signed   By: Eddie Candle M.D.   On: 05/19/2019 12:30   Dg Abdomen Acute W/chest  Result Date: 05/19/2019 CLINICAL DATA:  Hematemesis, epigastric abdominal pain EXAM: DG ABDOMEN ACUTE W/ 1V CHEST COMPARISON:  Chest radiographs dated 03/02/2019 FINDINGS: Lungs are clear.  No pleural effusion or pneumothorax. The heart is normal in size.  Left axillary vascular stent. Nonobstructive bowel gas pattern. No evidence of free air under the diaphragm on the upright view. Vascular stents overlying the upper abdomen. Additional right common iliac artery stent. Surgical clips overlying the left lower abdomen. Visualized osseous structures are within normal limits. IMPRESSION: No evidence of acute cardiopulmonary disease. No evidence of small bowel obstruction or free air. Electronically Signed   By: Julian Hy M.D.   On: 05/19/2019 03:03   US Abdomen Limited Ruq  Result Date: 05/19/2019 CLINICAL DATA:  Hepatitis. EXAM: ULTRASOUND ABDOMEN LIMITED RIGHT UPPER QUADRANT COMPARISON:  None. FINDINGS: Gallbladder: Sludge within the partially distended gallbladder. No wall thickening or focal tenderness. Common bile duct: Diameter: 5 mm.  Where visualized, no filling defect. Liver: Echogenic liver with small volume ascites. No evident mass. Portal vein is patent on color Doppler imaging with normal direction of blood flow towards the  liver. Marked right renal atrophy. IMPRESSION: 1. Gallbladder sludge. 2. Hepatocellular disease. 3. Small ascites. 4. Marked right renal atrophy. Electronically Signed   By: Monte Fantasia M.D.   On: 05/19/2019 04:45     ASSESSMENT AND PLAN:    * Acute liver failure -unclear etiology upfront.  Acute hepatitis panel pending.  Patient was HIV Negative on 12/2018.  Patient has gallbladder sludge on ultrasound, but no significant pathology within the common bile duct.  Tylenol <10.  Patient does have an elevated INR, and thrombocytopenia. GI consulted and working him up Vitamin K given. INR improved to 2.7.    * Hematemesis -likely related to his coagulopathy related to his liver failure and thrombocytopenia  No active bleeding seen at this time. On Protonix, octreotide drip.  IV ceftriaxone daily  * Thrombocytopenia-improving  Will transfuse platelets prior to EGD or if <20k No Schistocytes Hem-onc on board  *  End stage renal disease on dialysis [facility]) -nephrology consult for dialysis support  *  Hypertension -continue home meds   Accepted at [facility] , waiting to hear regarding bed availability.  All the records are reviewed and case discussed with Care Management/Social Worker Management plans discussed with the patient, family and they are in agreement.  CODE STATUS: FULL CODE  TOTAL TIME TAKING CARE OF THIS PATIENT: 80 minutes.   POSSIBLE D/C IN 2-3 DAYS, DEPENDING ON CLINICAL CONDITION.  Leia Alf Flynn Lininger M.D on 05/20/2019 at 12:14 PM  Between 7am to 6pm - Pager - [phone]  After 6pm go to [email or web address] - password [email or web address] [facility]  Office  [phone]  CC: Primary care physician; Patient, [name]  Note: This dictation was prepared with Dragon dictation along with smaller phrase technology. Any transcriptional errors that result from this process are unintentional.

## 2019-05-20 NOTE — Progress Notes (Signed)
Hematology/Oncology Progress Note [facility] Telephone:([phone] [phone]  Patient Care Team: Patient, [name] as PCP - General (General Practice)   Name of the patient: Brett Small  [id number]  25-Jan-1976  Date of visit: 05/20/19   INTERVAL HISTORY-  Patient sitting the chair.  No acute distress.  Continue to have intermittent nausea.  Had dark stool this morning per nurse. He denies any pain.   Denies any alcohol use or use of herbal supplements   Review of systems- Review of Systems  Constitutional: Positive for fatigue.  Respiratory: Negative for cough and shortness of breath.   Gastrointestinal: Positive for nausea.  Genitourinary: Negative for dysuria.   Musculoskeletal: Negative for back pain.  Neurological: Negative for light-headedness.  Hematological: Bruises/bleeds easily.  Psychiatric/Behavioral: Negative for confusion.    Allergies  Allergen Reactions   Codeine Nausea Only    Patient questioned this (??)   Sulfa Antibiotics Hives and Nausea And Vomiting    Patient Active Problem List   Diagnosis Date Noted   Acute liver failure 05/19/2019   Hematemesis 05/19/2019   Hepatitis    Thrombocytopenia (HCC)    Coagulopathy (HCC)    Sepsis ([place]) 02/28/2019   Lobar pneumonia ([place]) 02/28/2019   Acute respiratory failure ([place]) 02/04/2019   Acute respiratory failure with hypoxemia ([place]) 01/27/2019   Depression 01/07/2019   MDD (major depressive disorder), single episode, severe , no psychosis ([place])    Homelessness    Acute respiratory failure with hypoxia ([place]) 12/25/2018   End stage renal disease on dialysis ([place]) 12/25/2018   Hypertension 12/25/2018   Renal osteodystrophy 12/25/2018     Past Medical History:  Diagnosis Date   Hypertension    Renal disorder      Past Surgical History:  Procedure Laterality Date   A/V SHUNT INTERVENTION Left 01/19/2019   Procedure: LEFT UPPER EXTREMITY  A/V SHUNTOGRAM / UPPER EXTREMITY ANGIOGRAM;  Surgeon: Algernon Huxley, MD;  Location: [place] [facility];  Service: Cardiovascular;  Laterality: Left;   A/V SHUNT INTERVENTION Left 03/02/2019   Procedure: A/V SHUNT INTERVENTION;  Surgeon: Algernon Huxley, MD;  Location: [place] [facility];  Service: Cardiovascular;  Laterality: Left;   AORTA - FEMORAL ARTERY BYPASS GRAFT     AV FISTULA PLACEMENT Left 12/26/2018   Procedure: INSERTION OF GORE STRETCH VASCULAR 4-7MM X  45CM IN LEFT UPPER ARM;  Surgeon: Marty Heck, MD;  Location: [facility];  Service: Vascular;  Laterality: Left;   DIALYSIS/PERMA CATHETER REMOVAL N/A 02/06/2019   Procedure: DIALYSIS/PERMA CATHETER REMOVAL;  Surgeon: Algernon Huxley, MD;  Location: [place] [facility];  Service: Cardiovascular;  Laterality: N/A;    Social History   Socioeconomic History   Marital status: Single    Spouse name: Not on file   Number of children: Not on file   Years of education: Not on file   Highest education level: Not on file  Occupational History   Not on file  Social Needs   Financial resource strain: Not on file   Food insecurity    Worry: Not on file    Inability: Not on file   Transportation needs    Medical: Not on file    Non-medical: Not on file  Tobacco Use   Smoking status: Current Every Day Smoker    Last attempt to quit: 06/26/2018    Years since quitting: 0.8   Smokeless tobacco: Never Used   Tobacco comment: smoked for 30 years  Substance and Sexual Activity   Alcohol use: Not Currently   Drug use: Never   Sexual activity: Not on file  Lifestyle   Physical activity    Days per week: Not on file    Minutes per session: Not on file   Stress: Not on file  Relationships   Social connections    Talks on phone: Not on file    Gets together: Not on file    Attends religious service: Not on file    Active member of club or organization: Not on file    Attends meetings of clubs or organizations: Not  on file    Relationship status: Not on file   Intimate partner violence    Fear of current or ex partner: Patient refused    Emotionally abused: Patient refused    Physically abused: Patient refused    Forced sexual activity: Patient refused  Other Topics Concern   Not on file  Social History Narrative   Not on file     Family History  Problem Relation Age of Onset   Hypertension Other    Diabetes Other    Clotting disorder Father      Current Facility-Administered Medications:    0.9 %  sodium chloride infusion, , Intravenous, PRN, Hillary Bow, MD, Stopped at 05/20/19 0604   cefTRIAXone (ROCEPHIN) 2 g in sodium chloride 0.9 % 100 mL IVPB, 2 g, Intravenous, Q24H, Lance Coon, MD, Stopped at 05/20/19 0559   [COMPLETED] octreotide (SANDOSTATIN) 2 mcg/mL load via infusion 50 mcg, 50 mcg, Intravenous, Once, 50 mcg at 05/19/19 0439 **AND** octreotide (SANDOSTATIN) 500 mcg in sodium chloride 0.9 % 250 mL (2 mcg/mL) infusion, 50 mcg/hr, Intravenous, Continuous, Lance Coon, MD, Stopped at 05/20/19 0941   ondansetron (ZOFRAN) tablet 4 mg, 4 mg, Oral, Q6H PRN **OR** ondansetron (ZOFRAN) injection 4 mg, 4 mg, Intravenous, Q6H PRN, Lance Coon, MD, 4 mg at 05/20/19 1158   pantoprazole (PROTONIX) 80 mg in sodium chloride 0.9 % 250 mL (0.32 mg/mL) infusion, 8 mg/hr, Intravenous, Continuous, Lance Coon, MD, Stopped at 05/20/19 0941   [START ON 05/22/2019] pantoprazole (PROTONIX) injection 40 mg, 40 mg, Intravenous, Q12H, Lance Coon, MD   sodium chloride 0.9 % bolus 500 mL, 500 mL, Intravenous, Once, Sudini, Srikar, MD   traMADol Veatrice Bourbon) tablet 50 mg, 50 mg, Oral, Q6H PRN, Loletha Grayer, MD, 50 mg at 05/20/19 0945   Physical exam:  Vitals:   05/19/19 2045 05/19/19 2107 05/20/19 0434 05/20/19 1225  BP: (!) 155/92 (!) 153/95 (!) 151/99 (!) 148/100  Pulse: 96 95 93 96  Resp: 20 20 20 20   Temp: 98.5 F (36.9 C) 98.4 F (36.9 C) 97.9 F (36.6 C) (!) 96.3 F (35.7  C)  TempSrc: Oral Oral Oral Axillary  SpO2: 95% 93% 97% 97%  Weight:      Height:       Physical Exam  Constitutional: He is oriented to person, place, and time. No distress.  HENT:  Head: Normocephalic and atraumatic.  Eyes: Pupils are equal, round, and reactive to light. Scleral icterus is present.  Neck: Normal range of motion. Neck supple.  Cardiovascular: Normal rate and regular rhythm.  Pulmonary/Chest: Effort normal. No respiratory distress.  Abdominal: Soft.  Musculoskeletal: Normal range of motion.        General: No edema.  Neurological: He is alert and oriented to person, place, and time.  Skin: Skin is warm.  Jaundice       CMP Latest Ref Rng &  Units 05/20/2019  Glucose 70 - 99 mg/dL 125(H)  BUN 6 - 20 mg/dL 48(H)  Creatinine 0.61 - 1.24 mg/dL 4.10(H)  Sodium 135 - 145 mmol/L 136  Potassium 3.5 - 5.1 mmol/L 4.3  Chloride 98 - 111 mmol/L 99  CO2 22 - 32 mmol/L 22  Calcium 8.9 - 10.3 mg/dL 7.8(L)  Total Protein 6.5 - 8.1 g/dL 5.2(L)  Total Bilirubin 0.3 - 1.2 mg/dL 15.0(H)  Alkaline Phos 38 - 126 U/L 81  AST 15 - 41 U/L 240(H)  ALT 0 - 44 U/L 438(H)   CBC Latest Ref Rng & Units 05/20/2019  WBC 4.0 - 10.5 K/uL 8.3  Hemoglobin 13.0 - 17.0 g/dL 11.1(L)  Hematocrit 39.0 - 52.0 % 33.3(L)  Platelets 150 - 400 K/uL 35(L)   RADIOGRAPHIC STUDIES: I have personally reviewed the radiological images as listed and agreed with the findings in the report.  Ct Abdomen Pelvis Wo Contrast  Result Date: 05/19/2019 CLINICAL DATA:  Liver failure, hematemesis EXAM: CT ABDOMEN AND PELVIS WITHOUT CONTRAST TECHNIQUE: Multidetector CT imaging of the abdomen and pelvis was performed following the standard protocol without IV contrast. Oral enteric contrast was administered. COMPARISON:  None. FINDINGS: Lower chest: Small pericardial effusion. Hepatobiliary: No solid liver abnormality is seen. No gallstones, gallbladder wall thickening, or biliary dilatation. Pancreas: Unremarkable. No  pancreatic ductal dilatation or surrounding inflammatory changes. Spleen: Normal in size without significant abnormality. Adrenals/Urinary Tract: Adrenal glands are unremarkable. Atrophic kidneys. Bladder is unremarkable. Stomach/Bowel: Stomach is within normal limits. Appendix appears normal. No evidence of bowel wall thickening, distention, or inflammatory changes. Vascular/Lymphatic: There is complex multi component aortobifemoral graft bypass. No enlarged abdominal or pelvic lymph nodes. Reproductive: No mass or other significant abnormality. Other: No abdominal wall hernia or abnormality. Small volume ascites. Musculoskeletal: No acute or significant osseous findings. IMPRESSION: 1.  Small volume ascites. 2.  Aortobifemoral graft bypass. 3.  Atrophic kidneys. 4.  Pericardial effusion. Electronically Signed   By: Eddie Candle M.D.   On: 05/19/2019 12:30   Dg Abdomen Acute W/chest  Result Date: 05/19/2019 CLINICAL DATA:  Hematemesis, epigastric abdominal pain EXAM: DG ABDOMEN ACUTE W/ 1V CHEST COMPARISON:  Chest radiographs dated 03/02/2019 FINDINGS: Lungs are clear.  No pleural effusion or pneumothorax. The heart is normal in size.  Left axillary vascular stent. Nonobstructive bowel gas pattern. No evidence of free air under the diaphragm on the upright view. Vascular stents overlying the upper abdomen. Additional right common iliac artery stent. Surgical clips overlying the left lower abdomen. Visualized osseous structures are within normal limits. IMPRESSION: No evidence of acute cardiopulmonary disease. No evidence of small bowel obstruction or free air. Electronically Signed   By: Julian Hy M.D.   On: 05/19/2019 03:03   US Abdomen Limited Ruq  Result Date: 05/19/2019 CLINICAL DATA:  Hepatitis. EXAM: ULTRASOUND ABDOMEN LIMITED RIGHT UPPER QUADRANT COMPARISON:  None. FINDINGS: Gallbladder: Sludge within the partially distended gallbladder. No wall thickening or focal tenderness. Common bile  duct: Diameter: 5 mm.  Where visualized, no filling defect. Liver: Echogenic liver with small volume ascites. No evident mass. Portal vein is patent on color Doppler imaging with normal direction of blood flow towards the liver. Marked right renal atrophy. IMPRESSION: 1. Gallbladder sludge. 2. Hepatocellular disease. 3. Small ascites. 4. Marked right renal atrophy. Electronically Signed   By: Monte Fantasia M.D.   On: 05/19/2019 04:45    Assessment and plan-  #Acute liver failure, coagulopathy Hepatitis panel is negative..  Acetaminophen level <10 Etiology of  acute liver failure is unknown. Patient has been accepted to [facility] for further evaluation.  Currently waiting for bed. Recommend to obtain liver US Doppler for further evaluation rule out Budd-Chiari. Had vitamin K, INR decreased to 2.7. Bilirubin further worsened to 15.  Combination of increased indirect and direct bilirubinemia GI recommendation reviewed. He will get further work-up with autoimmune hepatitis. He had one episode of dark stool this morning.  Hemoglobin remained stable.  Continue close monitoring. Awaiting being transferred to tertiary center. #Thrombocytopenia, improved to 35,000.  Likely peripheral destruction/consumption given the increased immature platelet fraction. Less likely TTP given the coagulopathy.  No significant schistocytes on smear.  Discussed with pathology.  Discussed with Dr. Darvin Neighbours and GI.  Thank you for allowing me to participate in the care of this patient.   Earlie Server, MD, PhD Hematology Oncology [facility] at [facility] Pager- [phone] 05/20/2019

## 2019-05-20 NOTE — Progress Notes (Signed)
Advance care planning  Purpose of Encounter Acute liver failure, GI bleed  Parties in Attendance Patient  Patients Decisional capacity Alert and oriented.  Able to make medical decisions.  No documented healthcare power of attorney.  He tells me his father would make decisions if he is unable to.  Discussed in detail regarding acute liver failure, GI bleed.  Treatment plan , prognosis discussed.  All questions answered.  CODE STATUS discussed and patient would like aggressive medical care along with intubation/defibrillation/chest compressions if needed.  FULL CODE  Time spent - 17 minutes

## 2019-05-20 NOTE — Progress Notes (Signed)
Pharmacy Antibiotic Note  Brett Small is a 43 y.o. male admitted on 05/19/2019 with GI Bleed and is being given ceftriaxone for secondary prophylaxis for SBP s/t GI Bleed.Marland Kitchen  Pharmacy has been consulted for Ceftriaxone dosing. Patient has concurrent hepatic and renal dysfunction and is getting HD on a TThSa schedule. Staff are attempting to transfer this patient to [facility] for further care pending bed availability  Plan: ceftriaxone 2g three-time weekly after dialysis on TThSa schedule  Height: 5\' 1"  (154.9 cm) Weight: 104 lb 15 oz (47.6 kg) IBW/kg (Calculated) : 52.3  Temp (24hrs), Avg:97.9 F (36.6 C), Min:96.3 F (35.7 C), Max:98.5 F (36.9 C)  Recent Labs  Lab 05/19/19 0209 05/19/19 0708 05/19/19 0847 05/19/19 1153 05/20/19 0644  WBC 12.0* 8.8  --   --  8.3  CREATININE 7.01* 7.32*  --   --  4.10*  LATICACIDVEN  --   --  5.3* 3.4*  --     Estimated Creatinine Clearance: 15.3 mL/min (A) (by C-G formula based on SCr of 4.1 mg/dL (H)).    Thank you for allowing pharmacy to be a part of this patient's care.  Vallery Sa, PharmD Clinical Pharmacist 05/20/2019 3:24 PM

## 2019-05-21 ENCOUNTER — Inpatient Hospital Stay: Payer: Medicare Other

## 2019-05-21 DIAGNOSIS — D696 Thrombocytopenia, unspecified: Secondary | ICD-10-CM

## 2019-05-21 LAB — CBC WITH DIFFERENTIAL/PLATELET
Abs Immature Granulocytes: 0.11 10*3/uL — ABNORMAL HIGH (ref 0.00–0.07)
Basophils Absolute: 0 10*3/uL (ref 0.0–0.1)
Basophils Relative: 0 %
Eosinophils Absolute: 0 10*3/uL (ref 0.0–0.5)
Eosinophils Relative: 0 %
HCT: 34.9 % — ABNORMAL LOW (ref 39.0–52.0)
Hemoglobin: 11.4 g/dL — ABNORMAL LOW (ref 13.0–17.0)
Immature Granulocytes: 1 %
Lymphocytes Relative: 8 %
Lymphs Abs: 0.8 10*3/uL (ref 0.7–4.0)
MCH: 32.3 pg (ref 26.0–34.0)
MCHC: 32.7 g/dL (ref 30.0–36.0)
MCV: 98.9 fL (ref 80.0–100.0)
Monocytes Absolute: 0.7 10*3/uL (ref 0.1–1.0)
Monocytes Relative: 7 %
Neutro Abs: 8.1 10*3/uL — ABNORMAL HIGH (ref 1.7–7.7)
Neutrophils Relative %: 84 %
Platelets: 36 10*3/uL — ABNORMAL LOW (ref 150–400)
RBC: 3.53 MIL/uL — ABNORMAL LOW (ref 4.22–5.81)
RDW: 22.6 % — ABNORMAL HIGH (ref 11.5–15.5)
Smear Review: DECREASED
WBC: 9.7 10*3/uL (ref 4.0–10.5)
nRBC: 0.2 % (ref 0.0–0.2)

## 2019-05-21 LAB — COMPREHENSIVE METABOLIC PANEL
ALT: 390 U/L — ABNORMAL HIGH (ref 0–44)
AST: 279 U/L — ABNORMAL HIGH (ref 15–41)
Albumin: 2.5 g/dL — ABNORMAL LOW (ref 3.5–5.0)
Alkaline Phosphatase: 80 U/L (ref 38–126)
Anion gap: 19 — ABNORMAL HIGH (ref 5–15)
BUN: 69 mg/dL — ABNORMAL HIGH (ref 6–20)
CO2: 17 mmol/L — ABNORMAL LOW (ref 22–32)
Calcium: 7.9 mg/dL — ABNORMAL LOW (ref 8.9–10.3)
Chloride: 96 mmol/L — ABNORMAL LOW (ref 98–111)
Creatinine, Ser: 5.38 mg/dL — ABNORMAL HIGH (ref 0.61–1.24)
GFR calc Af Amer: 14 mL/min — ABNORMAL LOW (ref >60)
GFR calc non Af Amer: 12 mL/min — ABNORMAL LOW (ref >60)
Glucose, Bld: 96 mg/dL (ref 70–99)
Potassium: 5.9 mmol/L — ABNORMAL HIGH (ref 3.5–5.1)
Sodium: 132 mmol/L — ABNORMAL LOW (ref 135–145)
Total Bilirubin: 17.9 mg/dL — ABNORMAL HIGH (ref 0.3–1.2)
Total Protein: 5.1 g/dL — ABNORMAL LOW (ref 6.5–8.1)

## 2019-05-21 LAB — PROTIME-INR
INR: 2.6 — ABNORMAL HIGH (ref 0.8–1.2)
Prothrombin Time: 27.8 seconds — ABNORMAL HIGH (ref 11.4–15.2)

## 2019-05-21 LAB — CERULOPLASMIN: Ceruloplasmin: 25.9 mg/dL (ref 16.0–31.0)

## 2019-05-21 LAB — CMV DNA, QUANTITATIVE, PCR
CMV DNA Quant: NEGATIVE IU/mL
Log10 CMV Qn DNA Pl: UNDETERMINED log10 IU/mL

## 2019-05-21 LAB — HEPATITIS B CORE ANTIBODY, TOTAL: Hep B Core Total Ab: NEGATIVE

## 2019-05-21 LAB — HCV RNA QUANT: HCV Quantitative: NOT DETECTED IU/mL (ref >50)

## 2019-05-21 LAB — ANTI-MICROSOMAL ANTIBODY LIVER / KIDNEY: LKM1 Ab: 0.8 Units (ref 0.0–20.0)

## 2019-05-21 LAB — HEPATITIS B DNA, ULTRAQUANTITATIVE, PCR
HBV DNA SERPL PCR-ACNC: NOT DETECTED IU/mL
HBV DNA SERPL PCR-LOG IU: UNDETERMINED log10 IU/mL

## 2019-05-21 MED ORDER — VITAMIN K1 10 MG/ML IJ SOLN
10.0000 mg | Freq: Once | INTRAVENOUS | Status: AC
  Administered 2019-05-21: 10 mg via INTRAVENOUS
  Filled 2019-05-21: qty 1

## 2019-05-21 NOTE — Progress Notes (Signed)
Pre- HD Checks:    05/21/19 0800  Vital Signs  Temp 97.7 F (36.5 C)  Temp Source Oral  Pulse Rate 97  Pulse Rate Source Monitor  Resp 18  BP (!) 153/103  BP Location Right Arm  BP Method Automatic  Patient Position (if appropriate) Lying  Oxygen Therapy  SpO2 97 %  O2 Device Room Air  Pain Assessment  Pain Scale 0-10  Pain Score 0  Dialysis Weight  Weight 50.9 kg  Type of Weight Pre-Dialysis  Time-Out for Hemodialysis  What Procedure? HD  Pt Identifiers(min of two) First/Last Name;MRN/Account#;Pt's DOB(use if MRN/Acct# not available  Correct Site? Yes  Correct Side? Yes  Correct Procedure? Yes  Consents Verified? Yes  Rad Studies Available? Yes  Safety Precautions Reviewed? Yes  Engineer, civil (consulting) Number 4  Station Number 4  UF/Alarm Test Passed  Conductivity: Meter 13.8  Conductivity: Machine  14.1  pH 7.6  Reverse Osmosis main  Normal Saline Lot Number [id number]  Dialyzer Lot Number [id number]  Disposable Set Lot Number [id number]  Machine Temperature 98.6 F (37 C)  Musician and Audible Yes  Blood Lines Intact and Secured Yes  Pre Treatment Patient Checks  Vascular access used during treatment Graft  Hepatitis B Surface Antigen Results Negative  Date Hepatitis B Surface Antigen Drawn 05/19/19  Isolation Initiated  (n/a)  Hepatitis B Surface Antibody  (core Ab drawn 05/19/19)  Hemodialysis Consent Verified Yes (per Pt verbal )  Hemodialysis Standing Orders Initiated Yes  ECG (Telemetry) Monitor On Yes  Prime Ordered Normal Saline  Length of  DialysisTreatment -hour(s) 3 Hour(s)  Dialysis Treatment Comments  (Na 140)  Dialyzer Elisio 17H NR  Dialysate 2K;2.5 Ca  Dialysate Flow Ordered 600  Blood Flow Rate Ordered 400 mL/min  Ultrafiltration Goal 2 Liters  Dialysis Blood Pressure Support Ordered Normal Saline  Education / Care Plan  Dialysis Education Provided Yes  Documented Education in Care Plan Yes  Outpatient Plan of Care Reviewed  and on Chart Yes

## 2019-05-21 NOTE — Progress Notes (Signed)
Tx Progress Note: Cold therapy applied to LUE post cannulation, cool rag applied to Pt forehead. Pt reports feeling warm but remains afebrile at 97.7 F.

## 2019-05-21 NOTE — Progress Notes (Signed)
HD Tx Completed:    05/21/19 1200  Vital Signs  Temp 97.8 F (36.6 C)  Temp Source Oral  Pulse Rate 90  Resp 10  BP (!) 156/92  BP Location Right Arm  BP Method Automatic  Patient Position (if appropriate) Lying  Oxygen Therapy  SpO2 96 %  O2 Device Room Air  Pain Assessment  Pain Scale 0-10  Pain Score 0  During Hemodialysis Assessment  Blood Flow Rate (mL/min) 325 mL/min  Arterial Pressure (mmHg) -150 mmHg  Venous Pressure (mmHg) 260 mmHg  Transmembrane Pressure (mmHg) 60 mmHg  Ultrafiltration Rate (mL/min) 830 mL/min  Dialysate Flow Rate (mL/min) 600 ml/min  Conductivity: Machine  13.9  HD Safety Checks Performed Yes  Intra-Hemodialysis Comments Tx completed

## 2019-05-21 NOTE — Progress Notes (Signed)
Post HD Tx:     05/21/19 1215  Vital Signs  Temp 97.8 F (36.6 C)  Temp Source Oral  Pulse Rate 89  Pulse Rate Source Monitor  Resp 19  BP (!) 146/80  BP Location Right Arm  BP Method Automatic  Patient Position (if appropriate) Lying  Oxygen Therapy  SpO2 98 %  O2 Device Room Air  Pain Assessment  Pain Scale 0-10  Pain Score 0  Post-Hemodialysis Assessment  Rinseback Volume (mL) 250 mL  KECN 64.8 V  Dialyzer Clearance Lightly streaked  Duration of HD Treatment -hour(s) 3 hour(s)  Hemodialysis Intake (mL) 500 mL  UF Total -Machine (mL) 2508 mL  Net UF (mL) 2008 mL  Tolerated HD Treatment Yes  AVG/AVF Arterial Site Held (minutes)  (15 min)  AVG/AVF Venous Site Held (minutes)  (15 min)  Education / Care Plan  Dialysis Education Provided Yes  Documented Education in Care Plan Yes  Outpatient Plan of Care Reviewed and on Chart Yes

## 2019-05-21 NOTE — TOC Initial Note (Signed)
Transition of Care [facility]) - Initial/Assessment Note    Patient Details  Name: Brett Small MRN: [id number] Date of Birth: 01-17-1976  Transition of Care [facility]) CM/SW Contact:    Beverly Sessions, RN Phone Number: 05/21/2019, 10:38 AM  Clinical Narrative:                  Per chart review patient has been accepted at [facility] and awaiting transfer.  Patient currently off the floor for HD.  RNCM to assess for extreme risk of readmission        Patient Goals and CMS Choice        Expected Discharge Plan and Services                                                Prior Living Arrangements/Services                       Activities of Daily Living Home Assistive Devices/Equipment: Cane (specify quad or straight) ADL Screening (condition at time of admission) Patient's cognitive ability adequate to safely complete daily activities?: Yes Is the patient deaf or have difficulty hearing?: No Does the patient have difficulty seeing, even when wearing glasses/contacts?: No Does the patient have difficulty concentrating, remembering, or making decisions?: No Patient able to express need for assistance with ADLs?: Yes Does the patient have difficulty dressing or bathing?: No Independently performs ADLs?: Yes (appropriate for developmental age) Does the patient have difficulty walking or climbing stairs?: Yes Weakness of Legs: Both Weakness of Arms/Hands: None  Permission Sought/Granted                  Emotional Assessment              Admission diagnosis:  Hepatitis [K75.9] Hematemesis with nausea [K92.0] Acute liver failure without hepatic coma [K72.00] Patient Active Problem List   Diagnosis Date Noted  . Acute liver failure 05/19/2019  . Hematemesis 05/19/2019  . Hepatitis   . Thrombocytopenia ([place])   . Coagulopathy ([place])   . Sepsis ([place]) 02/28/2019  . Lobar pneumonia ([place]) 02/28/2019  . Acute respiratory failure ([place]) 02/04/2019  .  Acute respiratory failure with hypoxemia ([place]) 01/27/2019  . Depression 01/07/2019  . MDD (major depressive disorder), single episode, severe , no psychosis ([place])   . Homelessness   . Acute respiratory failure with hypoxia ([place]) 12/25/2018  . End stage renal disease on dialysis ([place]) 12/25/2018  . Hypertension 12/25/2018  . Renal osteodystrophy 12/25/2018   PCP:  Patient, [name] Pharmacy:   [facility] [place] [address] [place] [address] Phone: [phone] Fax: [phone]  [facility] [place], [place] [address] Phone: [phone] Fax: [phone]  [place], [place] - [place] [address] Phone: [phone] Fax: [phone]     Social Determinants of Health (SDOH) Interventions    Readmission Risk Interventions Readmission Risk Prevention Plan 03/03/2019 03/03/2019 02/24/2019  Transportation Screening - Complete Complete  Medication Review ([place]) - Complete Complete  PCP or Specialist appointment within 3-5 days of discharge Complete - Complete  PCP/Specialist Appt Not Complete comments - - -  [place] or [place] - (  No Data) Complete  SW Recovery Care/Counseling Consult - - Complete  Palliative Care Screening - Not Applicable Not Applicable  Skilled Nursing Facility - Not Applicable Not Applicable

## 2019-05-21 NOTE — Progress Notes (Signed)
[facility]  ROUNDING NOTE   Subjective:   Seen and examined on hemodialysis treatment. Tolerating treatment well. UF goal of 2 liters.     HEMODIALYSIS FLOWSHEET:  Blood Flow Rate (mL/min): 400 mL/min Arterial Pressure (mmHg): -180 mmHg Venous Pressure (mmHg): 160 mmHg Transmembrane Pressure (mmHg): 50 mmHg Ultrafiltration Rate (mL/min): 840 mL/min Dialysate Flow Rate (mL/min): 800 ml/min Conductivity: Machine : 14 Conductivity: Machine : 14 Dialysis Fluid Bolus: Normal Saline Bolus Amount (mL): 300 mL    Objective:  Vital signs in last 24 hours:  Temp:  [96.3 F (35.7 C)-98.7 F (37.1 C)] 98.3 F (36.8 C) (07/30 0500) Pulse Rate:  [96-100] 99 (07/30 0500) Resp:  [17-20] 17 (07/30 0500) BP: (128-148)/(82-100) 128/82 (07/30 0500) SpO2:  [97 %-98 %] 97 % (07/30 0500)  Weight change:  Filed Weights   05/19/19 0151 05/19/19 0616  Weight: 45.4 kg 47.6 kg    Intake/Output: I/O last 3 completed shifts: In: 1452.5 [P.O.:718; I.V.:634.5; IV Piggyback:100] Out: 200 [Emesis/NG output:200]   Intake/Output this shift:  No intake/output data recorded.  Physical Exam: General: NAD, laying in bed  Head: Normocephalic, atraumatic. Moist oral mucosal membranes  Eyes: +icteric sclera  Neck: Supple   Lungs:  Clear to auscultation  Heart: Regular rate and rhythm  Abdomen:  Soft, nontender  Extremities: no peripheral edema.  Neurologic: nonfocal  Skin: +jaundice  Access: Left AVG    Basic Metabolic Panel: Recent Labs  Lab 05/19/19 0209 05/19/19 0708 05/20/19 0644 05/21/19 0605  NA 132* 133* 136 132*  K 4.6 4.8 4.3 5.9*  CL 88* 90* 99 96*  CO2 13* 17* 22 17*  GLUCOSE 99 114* 125* 96  BUN 87* 98* 48* 69*  CREATININE 7.01* 7.32* 4.10* 5.38*  CALCIUM 8.2* 7.8* 7.8* 7.9*    Liver Function Tests: Recent Labs  Lab 05/19/19 0209 05/19/19 0708 05/20/19 0644 05/21/19 0605  AST 379* 380* 240* 279*  ALT 679* 607* 438* 390*  ALKPHOS 95 87 81 80   BILITOT 14.0* 12.7* 15.0* 17.9*  PROT 5.8* 5.2* 5.2* 5.1*  ALBUMIN 3.1* 2.7* 2.5* 2.5*   Recent Labs  Lab 05/19/19 0209  LIPASE 79*   No results for input(s): AMMONIA in the last 168 hours.  CBC: Recent Labs  Lab 05/19/19 0209 05/19/19 0708 05/20/19 0644 05/21/19 0605  WBC 12.0* 8.8 8.3 9.7  NEUTROABS  --  7.4 7.1 8.1*  HGB 11.9* 10.3* 11.1* 11.4*  HCT 36.7* 31.1* 33.3* 34.9*  MCV 99.5 98.4 98.2 98.9  PLT 28* 24* 35* 36*    Cardiac Enzymes: No results for input(s): CKTOTAL, CKMB, CKMBINDEX, TROPONINI in the last 168 hours.  BNP: Invalid input(s): POCBNP  CBG: No results for input(s): GLUCAP in the last 168 hours.  Microbiology: Results for orders placed or performed during the hospital encounter of 05/19/19  SARS Coronavirus 2 (CEPHEID - Performed in [place] hospital lab), Hosp Order     Status: None   Collection Time: 05/19/19  5:30 AM   Specimen: Nasopharyngeal Swab  Result Value Ref Range Status   SARS Coronavirus 2 NEGATIVE NEGATIVE Final    Comment: (NOTE) If result is NEGATIVE SARS-CoV-2 target nucleic acids are NOT DETECTED. The SARS-CoV-2 RNA is generally detectable in upper and lower  respiratory specimens during the acute phase of infection. The lowest  concentration of SARS-CoV-2 viral copies this assay can detect is 250  copies / mL. A negative result does not preclude SARS-CoV-2 infection  and should not be used as the  sole basis for treatment or other  patient management decisions.  A negative result may occur with  improper specimen collection / handling, submission of specimen other  than nasopharyngeal swab, presence of viral mutation(s) within the  areas targeted by this assay, and inadequate number of viral copies  (<250 copies / mL). A negative result must be combined with clinical  observations, patient history, and epidemiological information. If result is POSITIVE SARS-CoV-2 target nucleic acids are DETECTED. The SARS-CoV-2 RNA is  generally detectable in upper and lower  respiratory specimens dur ing the acute phase of infection.  Positive  results are indicative of active infection with SARS-CoV-2.  Clinical  correlation with patient history and other diagnostic information is  necessary to determine patient infection status.  Positive results do  not rule out bacterial infection or co-infection with other viruses. If result is PRESUMPTIVE POSTIVE SARS-CoV-2 nucleic acids MAY BE PRESENT.   A presumptive positive result was obtained on the submitted specimen  and confirmed on repeat testing.  While 2019 novel coronavirus  (SARS-CoV-2) nucleic acids may be present in the submitted sample  additional confirmatory testing may be necessary for epidemiological  and / or clinical management purposes  to differentiate between  SARS-CoV-2 and other Sarbecovirus currently known to infect humans.  If clinically indicated additional testing with an alternate test  methodology [phone]) is advised. The SARS-CoV-2 RNA is generally  detectable in upper and lower respiratory sp ecimens during the acute  phase of infection. The expected result is Negative. Fact Sheet for Patients:  StrictlyIdeas.no Fact Sheet for Healthcare Providers: BankingDealers.co.za This test is not yet approved or cleared by the [place] FDA and has been authorized for detection and/or diagnosis of SARS-CoV-2 by FDA under an Emergency Use Authorization (EUA).  This EUA will remain in effect (meaning this test can be used) for the duration of the COVID-19 declaration under Section 564(b)(1) of the Act, 21 U.S.C. section 360bbb-3(b)(1), unless the authorization is terminated or revoked sooner. Performed at [facility], [address] [place], [place] [address]   MRSA PCR Screening     Status: None   Collection Time: 05/19/19  6:15 AM   Specimen: Nasal Mucosa; Nasopharyngeal  Result Value  Ref Range Status   MRSA by PCR NEGATIVE NEGATIVE Final    Comment:        The GeneXpert MRSA Assay (FDA approved for NASAL specimens only), is one component of a comprehensive MRSA colonization surveillance program. It is not intended to diagnose MRSA infection nor to guide or monitor treatment for MRSA infections. Performed at [facility], [address] [place], [place] [address]   Culture, blood (Routine X 2) w Reflex to ID Panel     Status: None (Preliminary result)   Collection Time: 05/19/19  7:08 AM   Specimen: BLOOD  Result Value Ref Range Status   Specimen Description BLOOD RIGHT HAND  Final   Special Requests   Final    BOTTLES DRAWN AEROBIC AND ANAEROBIC Blood Culture results may not be optimal due to an excessive volume of blood received in culture bottles   Culture   Final    NO GROWTH < 24 HOURS Performed at [facility], [address] [place], [place] [address]    Report Status PENDING  Incomplete  Culture, blood (Routine X 2) w Reflex to ID Panel     Status: None (Preliminary result)   Collection Time: 05/19/19  8:48 AM   Specimen: BLOOD  Result Value Ref Range Status   Specimen Description BLOOD RIGHT HAND  Final   Special Requests   Final    BOTTLES DRAWN AEROBIC ONLY Blood Culture results may not be optimal due to an excessive volume of blood received in culture bottles   Culture   Final    NO GROWTH < 24 HOURS Performed at [facility], [address] [place], [place] [address]    Report Status PENDING  Incomplete    Coagulation Studies: Recent Labs    05/19/19 0328 05/20/19 0644 05/21/19 0712  LABPROT 44.9* 28.1* 27.8*  INR 4.9* 2.7* 2.6*    Urinalysis: No results for input(s): COLORURINE, LABSPEC, PHURINE, GLUCOSEU, HGBUR, BILIRUBINUR, KETONESUR, PROTEINUR, UROBILINOGEN, NITRITE, LEUKOCYTESUR in the last 72 hours.  Invalid input(s): APPERANCEUR    Imaging: Ct Abdomen Pelvis Wo Contrast  Result Date:  05/19/2019 CLINICAL DATA:  Liver failure, hematemesis EXAM: CT ABDOMEN AND PELVIS WITHOUT CONTRAST TECHNIQUE: Multidetector CT imaging of the abdomen and pelvis was performed following the standard protocol without IV contrast. Oral enteric contrast was administered. COMPARISON:  None. FINDINGS: Lower chest: Small pericardial effusion. Hepatobiliary: No solid liver abnormality is seen. No gallstones, gallbladder wall thickening, or biliary dilatation. Pancreas: Unremarkable. No pancreatic ductal dilatation or surrounding inflammatory changes. Spleen: Normal in size without significant abnormality. Adrenals/Urinary Tract: Adrenal glands are unremarkable. Atrophic kidneys. Bladder is unremarkable. Stomach/Bowel: Stomach is within normal limits. Appendix appears normal. No evidence of bowel wall thickening, distention, or inflammatory changes. Vascular/Lymphatic: There is complex multi component aortobifemoral graft bypass. No enlarged abdominal or pelvic lymph nodes. Reproductive: No mass or other significant abnormality. Other: No abdominal wall hernia or abnormality. Small volume ascites. Musculoskeletal: No acute or significant osseous findings. IMPRESSION: 1.  Small volume ascites. 2.  Aortobifemoral graft bypass. 3.  Atrophic kidneys. 4.  Pericardial effusion. Electronically Signed   By: Eddie Candle M.D.   On: 05/19/2019 12:30     Medications:   . sodium chloride Stopped (05/20/19 0604)  . cefTRIAXone (ROCEPHIN)  IV    . octreotide  (SANDOSTATIN)    IV infusion 50 mcg/hr (05/21/19 0001)  . pantoprozole (PROTONIX) infusion 8 mg/hr (05/21/19 0001)  . sodium chloride     . [START ON 05/22/2019] pantoprazole  40 mg Intravenous Q12H   sodium chloride, ondansetron **OR** ondansetron (ZOFRAN) IV, promethazine, traMADol  Assessment/ Plan:  [name] is a 43 y.o. white male with end stage renal disease on hemodialysis, hypertension, peripheral vascular disease, depression who is admitted to [facility]  on 05/19/2019 for Hepatitis [K75.9] Hematemesis with nausea [K92.0] Acute liver failure without hepatic coma [K72.00]  [place]. TTS 48kg Left AVG  1. End Stage Renal Disease:seen and examined on hemdoialysis.   2. Hypertension: holding blood pressure medications. Home regimen of amlodipine, carvedilol, and hydralazine.  3. Anemia of chronic kidney disease:   - Holding EPO  4. Secondary Hyperparathyroidism: outpatient labs on 7/16 PTH 929, phosphorus 3.9, calcium 8.4.  - sevelamer with meals once restart PO diet. Currently NPO  5. Acute liver failure with jaundice: new presentation. Patient denies alcohol use. Denies IVDA Acute viral hepatitis panel negative this admission in addition to being negative in March, April and May.  RUQ ultrasound with gallbladder sludge.  Plan to transfer patient to tertiary care center.    LOS: 2 Shanteria Laye 7/30/20208:44 AM

## 2019-05-21 NOTE — Progress Notes (Signed)
Anda Kraft transfer nurse from [facility] called for report on patient. Per Anda Kraft currently still no beds available and no known discharges.   Fuller Mandril, RN

## 2019-05-21 NOTE — Progress Notes (Signed)
[facility] [facility] [place] NAME: Brett Small    MR#:  [id number]  DATE OF BIRTH:  1976/04/02  SUBJECTIVE:  CHIEF COMPLAINT:   Chief Complaint  Patient presents with  . Hematemesis   Has some chronic lower extremity pain.  He wants to make sure he gets Neurontin here in the hospital. No melena  REVIEW OF SYSTEMS:    Review of Systems  Constitutional: Positive for malaise/fatigue. Negative for chills and fever.  HENT: Negative for sore throat.   Eyes: Negative for blurred vision, double vision and pain.  Respiratory: Negative for cough, hemoptysis, shortness of breath and wheezing.   Cardiovascular: Negative for chest pain, palpitations, orthopnea and leg swelling.  Gastrointestinal: Negative for abdominal pain, constipation, diarrhea, heartburn, nausea and vomiting.  Genitourinary: Negative for dysuria and hematuria.  Musculoskeletal: Negative for back pain and joint pain.  Skin: Negative for rash.  Neurological: Negative for sensory change, speech change, focal weakness and headaches.  Endo/Heme/Allergies: Does not bruise/bleed easily.  Psychiatric/Behavioral: Negative for depression. The patient is not nervous/anxious.     DRUG ALLERGIES:   Allergies  Allergen Reactions  . Codeine Nausea Only    Patient questioned this (??)  . Sulfa Antibiotics Hives and Nausea And Vomiting    VITALS:  Blood pressure 135/80, pulse 88, temperature 97.8 F (36.6 C), temperature source Oral, resp. rate 16, height 5\' 1"  (1.549 m), weight 50.9 kg, SpO2 96 %.  PHYSICAL EXAMINATION:   Physical Exam  GENERAL:  43 y.o.-year-old patient lying in the bed with no acute distress.  EYES: Pupils equal, round, reactive to light and accommodation. No scleral icterus. Extraocular muscles intact.  HEENT: Head atraumatic, normocephalic. Oropharynx and nasopharynx clear.  NECK:  Supple, no jugular venous distention. No thyroid enlargement, no tenderness.  LUNGS:  Normal breath sounds bilaterally, no wheezing, rales, rhonchi. No use of accessory muscles of respiration.  CARDIOVASCULAR: S1, S2 normal. No murmurs, rubs, or gallops.  ABDOMEN: Soft, nontender, nondistended. Bowel sounds present. No organomegaly or mass.  EXTREMITIES: No cyanosis, clubbing or edema b/l.    NEUROLOGIC: Cranial nerves II through XII are intact. No focal Motor or sensory deficits b/l.   PSYCHIATRIC: The patient is alert and oriented x 3.  SKIN: No obvious rash, lesion, or ulcer.   LABORATORY PANEL:   CBC Recent Labs  Lab 05/21/19 0605  WBC 9.7  HGB 11.4*  HCT 34.9*  PLT 36*   ------------------------------------------------------------------------------------------------------------------ Chemistries  Recent Labs  Lab 05/21/19 0605  NA 132*  K 5.9*  CL 96*  CO2 17*  GLUCOSE 96  BUN 69*  CREATININE 5.38*  CALCIUM 7.9*  AST 279*  ALT 390*  ALKPHOS 80  BILITOT 17.9*   ------------------------------------------------------------------------------------------------------------------  Cardiac Enzymes No results for input(s): TROPONINI in the last 168 hours. ------------------------------------------------------------------------------------------------------------------  RADIOLOGY:  US Liver Doppler  Result Date: 05/21/2019 CLINICAL DATA:  Liver failure.  Hepatitis. EXAM: DUPLEX ULTRASOUND OF LIVER TECHNIQUE: Color and duplex Doppler ultrasound was performed to evaluate the hepatic in-flow and out-flow vessels. COMPARISON:  CT scan of the abdomen and pelvis - 05/19/2019; abdominal ultrasound - 05/19/2019 FINDINGS: Liver: Mild diffuse increased echogenicity of the hepatic parenchyma. No discrete hepatic lesions. Small amount of intra-abdominal ascites. No intra or extrahepatic biliary ductal dilatation. Main Portal Vein size: 1.0 cm Portal Vein Velocities - normal directional flow is demonstrated throughout the portal system. Main Prox:  29 cm/sec Main Mid: 23  cm/sec Main Dist:  29 cm/sec Right: 12 cm/sec  Left: 21 cm/sec Hepatic Vein Velocities - bidirectional flow is demonstrated within the hepatic venous system. Right:  32 cm/sec Middle:  53 cm/sec Left:  41 cm/sec IVC: Present and patent with normal respiratory phasicity. Hepatic Artery Velocity:  75.6 cm/sec Splenic Vein Velocity:  11.1 cm/sec Spleen: 8.5 cm x 10.2 cm x 4.2 cm with a total volume of 190.5 cm^3 (411 cm^3 is upper limit normal) Portal Vein Occlusion/Thrombus: No Splenic Vein Occlusion/Thrombus: No Ascites: Small volume intra-abdominal ascites Varices: None Note is also made of a small left-sided pleural effusion (image 50). IMPRESSION: 1. Portal venous system is widely patent with normal directional flow. 2. Bidirectional flow demonstrated within the hepatic venous system as could be seen in the setting of right-sided heart failure. 3. Echogenic liver with small volume intra-abdominal ascites compatible with provided history liver failure. 4. Incidental note made of a small left-sided pleural effusion, new compared to CT scan the abdomen pelvis performed 05/19/2019. Further evaluation with dedicated chest radiograph could be performed as indicated. Electronically Signed   By: Sandi Mariscal M.D.   On: 05/21/2019 14:55     ASSESSMENT AND PLAN:    * Acute liver failure -unclear etiology upfront.  Acute hepatitis panel pending.  Patient was HIV Negative on 12/2018.  Patient has gallbladder sludge on ultrasound, but no significant pathology within the common bile duct.  Tylenol <10.   GI consulted and working him up Liver doppler US pending We will give vitamin K again today. INR at 2.6   * Hematemesis -likely related to his coagulopathy related to his liver failure and thrombocytopenia  No active bleeding seen at this time. On Protonix, octreotide.  IV ceftriaxone.  * Thrombocytopenia-still low at 53K Will transfuse platelets prior to EGD or if <20k No Schistocytes Hem-onc on board.  *   End stage renal disease on dialysis [facility]) - nephrology consult for dialysis support  *  Hypertension -continue home meds   Accepted at [facility] to hear regarding bed availability.  All the records are reviewed and case discussed with Care Management/Social Worker Management plans discussed with the patient, family and they are in agreement.  CODE STATUS: FULL CODE  TOTAL TIME TAKING CARE OF THIS PATIENT: 30 minutes.   POSSIBLE D/C IN 2-3 DAYS, DEPENDING ON CLINICAL CONDITION.  Leia Alf Mak Bonny M.D on 05/21/2019 at 3:06 PM  Between 7am to 6pm - Pager - [phone]  After 6pm go to [email or web address] - password [email or web address] [facility]  Office  [phone]  CC: Primary care physician; Patient, [name]  Note: This dictation was prepared with Dragon dictation along with smaller phrase technology. Any transcriptional errors that result from this process are unintentional.

## 2019-05-21 NOTE — Progress Notes (Signed)
Brett Antigua, MD [address] [place], [place], [place], [address] [address], [place], [place], [place], [address] Phone: [phone]  Fax: [phone]   Subjective: No further emesis.  No further episodes of GI bleed   Objective: Exam: Vital signs in last 24 hours: Vitals:   05/21/19 1145 05/21/19 1200 05/21/19 1215 05/21/19 1410  BP: (!) 159/91 (!) 156/92 (!) 146/80 135/80  Pulse: 90 90 89 88  Resp: (!) 22 10 19 16   Temp:  97.8 F (36.6 C) 97.8 F (36.6 C) 97.8 F (36.6 C)  TempSrc:  Oral Oral Oral  SpO2: 95% 96% 98% 96%  Weight:      Height:       Weight change:   Intake/Output Summary (Last 24 hours) at 05/21/2019 1717 Last data filed at 05/21/2019 1215 Gross per 24 hour  Intake --  Output 2008 ml  Net -2008 ml    General: No acute distress, AAO x3 Abd: Soft, NT/ND, No HSM Skin: Warm, no rashes Neck: Supple, Trachea midline   Lab Results: Lab Results  Component Value Date   WBC 9.7 05/21/2019   HGB 11.4 (L) 05/21/2019   HCT 34.9 (L) 05/21/2019   MCV 98.9 05/21/2019   PLT 36 (L) 05/21/2019   Micro Results: Recent Results (from the past 240 hour(s))  SARS Coronavirus 2 (CEPHEID - Performed in [facility] hospital lab), Hosp Order     Status: None   Collection Time: 05/19/19  5:30 AM   Specimen: Nasopharyngeal Swab  Result Value Ref Range Status   SARS Coronavirus 2 NEGATIVE NEGATIVE Final    Comment: (NOTE) If result is NEGATIVE SARS-CoV-2 target nucleic acids are NOT DETECTED. The SARS-CoV-2 RNA is generally detectable in upper and lower  respiratory specimens during the acute phase of infection. The lowest  concentration of SARS-CoV-2 viral copies this assay can detect is 250  copies / mL. A negative result does not preclude SARS-CoV-2 infection  and should not be used as the sole basis for treatment or other  patient management decisions.  A negative result may occur with  improper specimen collection / handling, submission of  specimen other  than nasopharyngeal swab, presence of viral mutation(s) within the  areas targeted by this assay, and inadequate number of viral copies  (<250 copies / mL). A negative result must be combined with clinical  observations, patient history, and epidemiological information. If result is POSITIVE SARS-CoV-2 target nucleic acids are DETECTED. The SARS-CoV-2 RNA is generally detectable in upper and lower  respiratory specimens dur ing the acute phase of infection.  Positive  results are indicative of active infection with SARS-CoV-2.  Clinical  correlation with patient history and other diagnostic information is  necessary to determine patient infection status.  Positive results do  not rule out bacterial infection or co-infection with other viruses. If result is PRESUMPTIVE POSTIVE SARS-CoV-2 nucleic acids MAY BE PRESENT.   A presumptive positive result was obtained on the submitted specimen  and confirmed on repeat testing.  While 2019 novel coronavirus  (SARS-CoV-2) nucleic acids may be present in the submitted sample  additional confirmatory testing may be necessary for epidemiological  and / or clinical management purposes  to differentiate between  SARS-CoV-2 and other Sarbecovirus currently known to infect humans.  If clinically indicated additional testing with an alternate test  methodology [phone]) is advised. The SARS-CoV-2 RNA is generally  detectable in upper and lower respiratory sp ecimens during the acute  phase of infection. The  expected result is Negative. Fact Sheet for Patients:  StrictlyIdeas.no Fact Sheet for Healthcare Providers: BankingDealers.co.za This test is not yet approved or cleared by the [place] FDA and has been authorized for detection and/or diagnosis of SARS-CoV-2 by FDA under an Emergency Use Authorization (EUA).  This EUA will remain in effect (meaning this test can be used) for the  duration of the COVID-19 declaration under Section 564(b)(1) of the Act, 21 U.S.C. section 360bbb-3(b)(1), unless the authorization is terminated or revoked sooner. Performed at [facility], [address] [place], [place] [address]   MRSA PCR Screening     Status: None   Collection Time: 05/19/19  6:15 AM   Specimen: Nasal Mucosa; Nasopharyngeal  Result Value Ref Range Status   MRSA by PCR NEGATIVE NEGATIVE Final    Comment:        The GeneXpert MRSA Assay (FDA approved for NASAL specimens only), is one component of a comprehensive MRSA colonization surveillance program. It is not intended to diagnose MRSA infection nor to guide or monitor treatment for MRSA infections. Performed at [facility], [address] [place], [place] [address]   Culture, blood (Routine X 2) w Reflex to ID Panel     Status: None (Preliminary result)   Collection Time: 05/19/19  7:08 AM   Specimen: BLOOD  Result Value Ref Range Status   Specimen Description BLOOD RIGHT HAND  Final   Special Requests   Final    BOTTLES DRAWN AEROBIC AND ANAEROBIC Blood Culture results may not be optimal due to an excessive volume of blood received in culture bottles   Culture   Final    NO GROWTH 2 DAYS Performed at [facility], [address] [place], [place] [address]    Report Status PENDING  Incomplete  Culture, blood (Routine X 2) w Reflex to ID Panel     Status: None (Preliminary result)   Collection Time: 05/19/19  8:48 AM   Specimen: BLOOD  Result Value Ref Range Status   Specimen Description BLOOD RIGHT HAND  Final   Special Requests   Final    BOTTLES DRAWN AEROBIC ONLY Blood Culture results may not be optimal due to an excessive volume of blood received in culture bottles   Culture   Final    NO GROWTH 2 DAYS Performed at [facility], [address] [place], [place] [address]    Report Status PENDING  Incomplete   Studies/Results: US Liver  Doppler  Result Date: 05/21/2019 CLINICAL DATA:  Liver failure.  Hepatitis. EXAM: DUPLEX ULTRASOUND OF LIVER TECHNIQUE: Color and duplex Doppler ultrasound was performed to evaluate the hepatic in-flow and out-flow vessels. COMPARISON:  CT scan of the abdomen and pelvis - 05/19/2019; abdominal ultrasound - 05/19/2019 FINDINGS: Liver: Mild diffuse increased echogenicity of the hepatic parenchyma. No discrete hepatic lesions. Small amount of intra-abdominal ascites. No intra or extrahepatic biliary ductal dilatation. Main Portal Vein size: 1.0 cm Portal Vein Velocities - normal directional flow is demonstrated throughout the portal system. Main Prox:  29 cm/sec Main Mid: 23 cm/sec Main Dist:  29 cm/sec Right: 12 cm/sec Left: 21 cm/sec Hepatic Vein Velocities - bidirectional flow is demonstrated within the hepatic venous system. Right:  32 cm/sec Middle:  53 cm/sec Left:  41 cm/sec IVC: Present and patent with normal respiratory phasicity. Hepatic Artery Velocity:  75.6 cm/sec Splenic Vein Velocity:  11.1 cm/sec Spleen: 8.5 cm x 10.2 cm x 4.2 cm with a total volume of 190.5  cm^3 (411 cm^3 is upper limit normal) Portal Vein Occlusion/Thrombus: No Splenic Vein Occlusion/Thrombus: No Ascites: Small volume intra-abdominal ascites Varices: None Note is also made of a small left-sided pleural effusion (image 50). IMPRESSION: 1. Portal venous system is widely patent with normal directional flow. 2. Bidirectional flow demonstrated within the hepatic venous system as could be seen in the setting of right-sided heart failure. 3. Echogenic liver with small volume intra-abdominal ascites compatible with provided history liver failure. 4. Incidental note made of a small left-sided pleural effusion, new compared to CT scan the abdomen pelvis performed 05/19/2019. Further evaluation with dedicated chest radiograph could be performed as indicated. Electronically Signed   By: Sandi Mariscal M.D.   On: 05/21/2019 14:55   Medications:   Scheduled Meds:  [START ON 05/22/2019] pantoprazole  40 mg Intravenous Q12H   Continuous Infusions:  sodium chloride Stopped (05/20/19 0604)   cefTRIAXone (ROCEPHIN)  IV     octreotide  (SANDOSTATIN)    IV infusion 50 mcg/hr (05/21/19 0001)   pantoprozole (PROTONIX) infusion 8 mg/hr (05/21/19 0001)   phytonadione (VITAMIN K) IV 10 mg (05/21/19 1633)   sodium chloride     PRN Meds:.sodium chloride, ondansetron **OR** ondansetron (ZOFRAN) IV, promethazine, traMADol   Assessment: Principal Problem:   Acute liver failure Active Problems:   End stage renal disease on dialysis (HCC)   Hypertension   Hematemesis   Hepatitis   Thrombocytopenia (HCC)   Coagulopathy (HCC)    Plan: Liver enzymes improving and so his coagulopathy Etiology unknown Liver Doppler ultrasound shows patent vessels  Continue to avoid hepatotoxic drugs  Patient is awaiting transfer to get liver biopsy can be done there if needed  Consider repeat echo given that the liver Doppler suggested fluid overload from CHF.  Last echo was in April 2020  He does not appear overtly fluid overloaded  Tomorrow will be 72 hours of octreotide drip.  Therefore I have decided to discontinue tomorrow morning   LOS: 2 days   Brett Antigua, MD 05/21/2019, 5:17 PM

## 2019-05-21 NOTE — Progress Notes (Signed)
Hematology/Oncology Progress Note [facility] Telephone:([phone] [phone]  Patient Care Team: Patient, [name] as PCP - General (General Practice)   Name of the patient: Brett Small  [id number]  Sep 23, 1976  Date of visit: 05/21/19   INTERVAL HISTORY-  Patient sits in the chair.  No acute overnight events  No hematochezia or hematemesis.  Intermittent nausea. No appetite.   Review of systems- Review of Systems  Constitutional: Positive for appetite change and fatigue.  Respiratory: Negative for cough and shortness of breath.   Gastrointestinal: Positive for nausea.  Genitourinary: Negative for dysuria.   Musculoskeletal: Negative for back pain.  Neurological: Negative for light-headedness.  Hematological: Bruises/bleeds easily.  Psychiatric/Behavioral: Negative for confusion.    Allergies  Allergen Reactions   Codeine Nausea Only    Patient questioned this (??)   Sulfa Antibiotics Hives and Nausea And Vomiting    Patient Active Problem List   Diagnosis Date Noted   Acute liver failure 05/19/2019   Hematemesis 05/19/2019   Hepatitis    Thrombocytopenia (HCC)    Coagulopathy (HCC)    Sepsis ([place]) 02/28/2019   Lobar pneumonia ([place]) 02/28/2019   Acute respiratory failure ([place]) 02/04/2019   Acute respiratory failure with hypoxemia ([place]) 01/27/2019   Depression 01/07/2019   MDD (major depressive disorder), single episode, severe , no psychosis ([place])    Homelessness    Acute respiratory failure with hypoxia ([place]) 12/25/2018   End stage renal disease on dialysis ([place]) 12/25/2018   Hypertension 12/25/2018   Renal osteodystrophy 12/25/2018     Past Medical History:  Diagnosis Date   Hypertension    Renal disorder      Past Surgical History:  Procedure Laterality Date   A/V SHUNT INTERVENTION Left 01/19/2019   Procedure: LEFT UPPER EXTREMITY A/V SHUNTOGRAM / UPPER EXTREMITY ANGIOGRAM;  Surgeon:  Algernon Huxley, MD;  Location: [facility];  Service: Cardiovascular;  Laterality: Left;   A/V SHUNT INTERVENTION Left 03/02/2019   Procedure: A/V SHUNT INTERVENTION;  Surgeon: Algernon Huxley, MD;  Location: [facility];  Service: Cardiovascular;  Laterality: Left;   AORTA - FEMORAL ARTERY BYPASS GRAFT     AV FISTULA PLACEMENT Left 12/26/2018   Procedure: INSERTION OF GORE STRETCH VASCULAR 4-7MM X  45CM IN LEFT UPPER ARM;  Surgeon: Marty Heck, MD;  Location: [facility];  Service: Vascular;  Laterality: Left;   DIALYSIS/PERMA CATHETER REMOVAL N/A 02/06/2019   Procedure: DIALYSIS/PERMA CATHETER REMOVAL;  Surgeon: Algernon Huxley, MD;  Location: [facility];  Service: Cardiovascular;  Laterality: N/A;    Social History   Socioeconomic History   Marital status: Single    Spouse name: Not on file   Number of children: Not on file   Years of education: Not on file   Highest education level: Not on file  Occupational History   Not on file  Social Needs   Financial resource strain: Not on file   Food insecurity    Worry: Not on file    Inability: Not on file   Transportation needs    Medical: Not on file    Non-medical: Not on file  Tobacco Use   Smoking status: Current Every Day Smoker    Last attempt to quit: 06/26/2018    Years since quitting: 0.9   Smokeless tobacco: Never Used   Tobacco comment: smoked for 30 years   Substance and Sexual Activity   Alcohol use: Not Currently  Drug use: Never   Sexual activity: Not on file  Lifestyle   Physical activity    Days per week: Not on file    Minutes per session: Not on file   Stress: Not on file  Relationships   Social connections    Talks on phone: Not on file    Gets together: Not on file    Attends religious service: Not on file    Active member of club or organization: Not on file    Attends meetings of clubs or organizations: Not on file    Relationship status: Not on file    Intimate partner violence    Fear of current or ex partner: Patient refused    Emotionally abused: Patient refused    Physically abused: Patient refused    Forced sexual activity: Patient refused  Other Topics Concern   Not on file  Social History Narrative   Not on file     Family History  Problem Relation Age of Onset   Hypertension Other    Diabetes Other    Clotting disorder Father      Current Facility-Administered Medications:    0.9 %  sodium chloride infusion, , Intravenous, PRN, Hillary Bow, MD, Stopped at 05/20/19 0604   cefTRIAXone (ROCEPHIN) 2 g in sodium chloride 0.9 % 100 mL IVPB, 2 g, Intravenous, Q T,Th,Sat-1800, Dallie Piles, RPH, Last Rate: 200 mL/hr at 05/21/19 1911, 2 g at 05/21/19 1911   [COMPLETED] octreotide (SANDOSTATIN) 2 mcg/mL load via infusion 50 mcg, 50 mcg, Intravenous, Once, 50 mcg at 05/19/19 0439 **AND** octreotide (SANDOSTATIN) 500 mcg in sodium chloride 0.9 % 250 mL (2 mcg/mL) infusion, 50 mcg/hr, Intravenous, Continuous, Tahiliani, Varnita B, MD, Last Rate: 25 mL/hr at 05/21/19 1911, 50 mcg/hr at 05/21/19 1911   ondansetron (ZOFRAN) tablet 4 mg, 4 mg, Oral, Q6H PRN **OR** ondansetron (ZOFRAN) injection 4 mg, 4 mg, Intravenous, Q6H PRN, Lance Coon, MD, 4 mg at 05/21/19 0225   pantoprazole (PROTONIX) 80 mg in sodium chloride 0.9 % 250 mL (0.32 mg/mL) infusion, 8 mg/hr, Intravenous, Continuous, Lance Coon, MD, Last Rate: 25 mL/hr at 05/21/19 0742, 8 mg/hr at 05/21/19 0742   [START ON 05/22/2019] pantoprazole (PROTONIX) injection 40 mg, 40 mg, Intravenous, Q12H, Lance Coon, MD   promethazine (PHENERGAN) injection 12.5 mg, 12.5 mg, Intravenous, Q6H PRN, Sudini, Srikar, MD, 12.5 mg at 05/20/19 1704   sodium chloride 0.9 % bolus 500 mL, 500 mL, Intravenous, Once, Sudini, Srikar, MD   traMADol (ULTRAM) tablet 50 mg, 50 mg, Oral, Q6H PRN, Loletha Grayer, MD, 50 mg at 05/21/19 1432   Physical exam:  Vitals:   05/21/19 1145  05/21/19 1200 05/21/19 1215 05/21/19 1410  BP: (!) 159/91 (!) 156/92 (!) 146/80 135/80  Pulse: 90 90 89 88  Resp: (!) 22 10 19 16   Temp:  97.8 F (36.6 C) 97.8 F (36.6 C) 97.8 F (36.6 C)  TempSrc:  Oral Oral Oral  SpO2: 95% 96% 98% 96%  Weight:      Height:       Physical Exam  Constitutional: He is oriented to person, place, and time. No distress.  HENT:  Head: Normocephalic and atraumatic.  Eyes: Pupils are equal, round, and reactive to light. Scleral icterus is present.  Neck: Normal range of motion. Neck supple.  Cardiovascular: Normal rate and regular rhythm.  Pulmonary/Chest: Effort normal. No respiratory distress.  Abdominal: Soft.  Musculoskeletal: Normal range of motion.        General: No  edema.  Neurological: He is alert and oriented to person, place, and time.  Skin: Skin is warm.  Jaundice       CMP Latest Ref Rng & Units 05/21/2019  Glucose 70 - 99 mg/dL 96  BUN 6 - 20 mg/dL 69(H)  Creatinine 0.61 - 1.24 mg/dL 5.38(H)  Sodium 135 - 145 mmol/L 132(L)  Potassium 3.5 - 5.1 mmol/L 5.9(H)  Chloride 98 - 111 mmol/L 96(L)  CO2 22 - 32 mmol/L 17(L)  Calcium 8.9 - 10.3 mg/dL 7.9(L)  Total Protein 6.5 - 8.1 g/dL 5.1(L)  Total Bilirubin 0.3 - 1.2 mg/dL 17.9(H)  Alkaline Phos 38 - 126 U/L 80  AST 15 - 41 U/L 279(H)  ALT 0 - 44 U/L 390(H)   CBC Latest Ref Rng & Units 05/21/2019  WBC 4.0 - 10.5 K/uL 9.7  Hemoglobin 13.0 - 17.0 g/dL 11.4(L)  Hematocrit 39.0 - 52.0 % 34.9(L)  Platelets 150 - 400 K/uL 36(L)   RADIOGRAPHIC STUDIES: I have personally reviewed the radiological images as listed and agreed with the findings in the report.  Ct Abdomen Pelvis Wo Contrast  Result Date: 05/19/2019 CLINICAL DATA:  Liver failure, hematemesis EXAM: CT ABDOMEN AND PELVIS WITHOUT CONTRAST TECHNIQUE: Multidetector CT imaging of the abdomen and pelvis was performed following the standard protocol without IV contrast. Oral enteric contrast was administered. COMPARISON:  None.  FINDINGS: Lower chest: Small pericardial effusion. Hepatobiliary: No solid liver abnormality is seen. No gallstones, gallbladder wall thickening, or biliary dilatation. Pancreas: Unremarkable. No pancreatic ductal dilatation or surrounding inflammatory changes. Spleen: Normal in size without significant abnormality. Adrenals/Urinary Tract: Adrenal glands are unremarkable. Atrophic kidneys. Bladder is unremarkable. Stomach/Bowel: Stomach is within normal limits. Appendix appears normal. No evidence of bowel wall thickening, distention, or inflammatory changes. Vascular/Lymphatic: There is complex multi component aortobifemoral graft bypass. No enlarged abdominal or pelvic lymph nodes. Reproductive: No mass or other significant abnormality. Other: No abdominal wall hernia or abnormality. Small volume ascites. Musculoskeletal: No acute or significant osseous findings. IMPRESSION: 1.  Small volume ascites. 2.  Aortobifemoral graft bypass. 3.  Atrophic kidneys. 4.  Pericardial effusion. Electronically Signed   By: Eddie Candle M.D.   On: 05/19/2019 12:30   Dg Abdomen Acute W/chest  Result Date: 05/19/2019 CLINICAL DATA:  Hematemesis, epigastric abdominal pain EXAM: DG ABDOMEN ACUTE W/ 1V CHEST COMPARISON:  Chest radiographs dated 03/02/2019 FINDINGS: Lungs are clear.  No pleural effusion or pneumothorax. The heart is normal in size.  Left axillary vascular stent. Nonobstructive bowel gas pattern. No evidence of free air under the diaphragm on the upright view. Vascular stents overlying the upper abdomen. Additional right common iliac artery stent. Surgical clips overlying the left lower abdomen. Visualized osseous structures are within normal limits. IMPRESSION: No evidence of acute cardiopulmonary disease. No evidence of small bowel obstruction or free air. Electronically Signed   By: Julian Hy M.D.   On: 05/19/2019 03:03   US Liver Doppler  Result Date: 05/21/2019 CLINICAL DATA:  Liver failure.   Hepatitis. EXAM: DUPLEX ULTRASOUND OF LIVER TECHNIQUE: Color and duplex Doppler ultrasound was performed to evaluate the hepatic in-flow and out-flow vessels. COMPARISON:  CT scan of the abdomen and pelvis - 05/19/2019; abdominal ultrasound - 05/19/2019 FINDINGS: Liver: Mild diffuse increased echogenicity of the hepatic parenchyma. No discrete hepatic lesions. Small amount of intra-abdominal ascites. No intra or extrahepatic biliary ductal dilatation. Main Portal Vein size: 1.0 cm Portal Vein Velocities - normal directional flow is demonstrated throughout the portal system. Main Prox:  29 cm/sec Main Mid: 23 cm/sec Main Dist:  29 cm/sec Right: 12 cm/sec Left: 21 cm/sec Hepatic Vein Velocities - bidirectional flow is demonstrated within the hepatic venous system. Right:  32 cm/sec Middle:  53 cm/sec Left:  41 cm/sec IVC: Present and patent with normal respiratory phasicity. Hepatic Artery Velocity:  75.6 cm/sec Splenic Vein Velocity:  11.1 cm/sec Spleen: 8.5 cm x 10.2 cm x 4.2 cm with a total volume of 190.5 cm^3 (411 cm^3 is upper limit normal) Portal Vein Occlusion/Thrombus: No Splenic Vein Occlusion/Thrombus: No Ascites: Small volume intra-abdominal ascites Varices: None Note is also made of a small left-sided pleural effusion (image 50). IMPRESSION: 1. Portal venous system is widely patent with normal directional flow. 2. Bidirectional flow demonstrated within the hepatic venous system as could be seen in the setting of right-sided heart failure. 3. Echogenic liver with small volume intra-abdominal ascites compatible with provided history liver failure. 4. Incidental note made of a small left-sided pleural effusion, new compared to CT scan the abdomen pelvis performed 05/19/2019. Further evaluation with dedicated chest radiograph could be performed as indicated. Electronically Signed   By: Sandi Mariscal M.D.   On: 05/21/2019 14:55   US Abdomen Limited Ruq  Result Date: 05/19/2019 CLINICAL DATA:  Hepatitis.  EXAM: ULTRASOUND ABDOMEN LIMITED RIGHT UPPER QUADRANT COMPARISON:  None. FINDINGS: Gallbladder: Sludge within the partially distended gallbladder. No wall thickening or focal tenderness. Common bile duct: Diameter: 5 mm.  Where visualized, no filling defect. Liver: Echogenic liver with small volume ascites. No evident mass. Portal vein is patent on color Doppler imaging with normal direction of blood flow towards the liver. Marked right renal atrophy. IMPRESSION: 1. Gallbladder sludge. 2. Hepatocellular disease. 3. Small ascites. 4. Marked right renal atrophy. Electronically Signed   By: Monte Fantasia M.D.   On: 05/19/2019 04:45     Assessment and plan-  #Acute liver failure, coagulopathy Hepatitis panel is negative..  Acetaminophen level <10 Bilirubin further worsened to 17 Liver doppler showed patent venous system. ?volume overload. Obtain Echo. Awaiting transfer to [facility].  Avoid hepatotoxic drugs.  No acute bleeding. On Octreotide.   Coagulopathy is improving.  Thrombocytopenia, platelet is stable. No acute bleeding.    Thank you for allowing me to participate in the care of this patient.   Earlie Server, MD, PhD Hematology Oncology [facility] at [facility] Pager- [phone] 05/21/2019

## 2019-05-21 NOTE — Progress Notes (Signed)
Post HD Tx Assessment:     05/21/19 1215  Neurological  Level of Consciousness Alert  Orientation Level Oriented X4  Respiratory  Respiratory Pattern Regular  Chest Assessment Chest expansion symmetrical  Cardiac  Heart Sounds S1, S2  Vascular  R Radial Pulse +2  L Radial Pulse +2  Psychosocial  Psychosocial (WDL) WDL

## 2019-05-21 NOTE — TOC Initial Note (Signed)
Transition of Care [facility]) - Initial/Assessment Note    Patient Details  Name: Brett Small MRN: [id number] Date of Birth: 07-04-1976  Transition of Care [facility]) CM/SW Contact:    Beverly Sessions, RN Phone Number: 05/21/2019, 3:25 PM  Clinical Narrative:                 Patient admitted from home with liver failure.  Patient resides at a boarding home. "[facility]"  Patient goes to HD TTS and utilizes [facility] transportation Patient denies issues with transportation to HD.   Marland Kitchen Patient states that if [facility] is unable to take him to his other appointment "then I don't have reliable transportation.  I just have to get who ever can take me to take me".  Patient provided with information on [facility]  Previous discharge patient was made a new patient appointment scheduled for [facility] for 5/19.  He did not go, and is not interested in a new Patient appointment being scheduled at this time.   Patient is pending transport to [place]. Has been accepted.  Awaiting a bed    Expected Discharge Plan: Home/Self Care     Patient Goals and CMS Choice        Expected Discharge Plan and Services Expected Discharge Plan: Home/Self Care       Living arrangements for the past 2 months: (boarding house)                                      Prior Living Arrangements/Services Living arrangements for the past 2 months: (boarding house)   Patient language and need for interpreter reviewed:: Yes Do you feel safe going back to the place where you live?: Yes               Activities of Daily Living Home Assistive Devices/Equipment: Cane (specify quad or straight) ADL Screening (condition at time of admission) Patient's cognitive ability adequate to safely complete daily activities?: Yes Is the patient deaf or have difficulty hearing?: No Does the patient have difficulty seeing, even when wearing glasses/contacts?: No Does the patient have difficulty concentrating, remembering, or  making decisions?: No Patient able to express need for assistance with ADLs?: Yes Does the patient have difficulty dressing or bathing?: No Independently performs ADLs?: Yes (appropriate for developmental age) Does the patient have difficulty walking or climbing stairs?: Yes Weakness of Legs: Both Weakness of Arms/Hands: None  Permission Sought/Granted                  Emotional Assessment Appearance:: Appears older than stated age     Orientation: : Oriented to Self, Oriented to Place, Oriented to  Time, Oriented to Situation Alcohol / Substance Use: Tobacco Use Psych Involvement: No (comment)  Admission diagnosis:  Hepatitis [K75.9] Hematemesis with nausea [K92.0] Acute liver failure without hepatic coma [K72.00] Patient Active Problem List   Diagnosis Date Noted  . Acute liver failure 05/19/2019  . Hematemesis 05/19/2019  . Hepatitis   . Thrombocytopenia ([place])   . Coagulopathy ([place])   . Sepsis ([place]) 02/28/2019  . Lobar pneumonia ([place]) 02/28/2019  . Acute respiratory failure ([place]) 02/04/2019  . Acute respiratory failure with hypoxemia ([place]) 01/27/2019  . Depression 01/07/2019  . MDD (major depressive disorder), single episode, severe , no psychosis ([place])   . Homelessness   . Acute respiratory failure with hypoxia ([place]) 12/25/2018  . End stage renal disease on  dialysis ([place]) 12/25/2018  . Hypertension 12/25/2018  . Renal osteodystrophy 12/25/2018   PCP:  Patient, [name] Pharmacy:   [place], [place] [address] [place] [address] Phone: [phone] Fax: [phone]  [place], [place] [address] Phone: [phone] Fax: [phone]  [place], [place] - [place] [address] Phone: [phone] Fax: [phone]     Social Determinants of Health (SDOH)  Interventions    Readmission Risk Interventions Readmission Risk Prevention Plan 05/21/2019 03/03/2019 03/03/2019  Transportation Screening Complete - Complete  Medication Review (RN Care Manager) Complete - Complete  PCP or Specialist appointment within 3-5 days of discharge - Complete -  PCP/Specialist Appt Not Complete comments - - -  [place] or [place] - - (No Data)  SW Recovery Care/Counseling Consult - - -  Palliative Care Screening Not Applicable - Not [place] Not Applicable - Not Applicable

## 2019-05-21 NOTE — Progress Notes (Signed)
Pre-HD Assessment:     05/21/19 0830  Neurological  Level of Consciousness Alert  Orientation Level Oriented X4  Respiratory  Respiratory Pattern Regular  Chest Assessment Chest expansion symmetrical  Cardiac  Heart Sounds S1, S2  Vascular  R Radial Pulse +2  L Radial Pulse +2  Psychosocial  Psychosocial (WDL) WDL

## 2019-05-22 ENCOUNTER — Inpatient Hospital Stay: Payer: Medicare Other

## 2019-05-22 LAB — CBC WITH DIFFERENTIAL/PLATELET
Abs Immature Granulocytes: 0.06 10*3/uL (ref 0.00–0.07)
Basophils Absolute: 0 10*3/uL (ref 0.0–0.1)
Basophils Relative: 0 %
Eosinophils Absolute: 0.1 10*3/uL (ref 0.0–0.5)
Eosinophils Relative: 1 %
HCT: 34.9 % — ABNORMAL LOW (ref 39.0–52.0)
Hemoglobin: 11.5 g/dL — ABNORMAL LOW (ref 13.0–17.0)
Immature Granulocytes: 1 %
Lymphocytes Relative: 7 %
Lymphs Abs: 0.7 10*3/uL (ref 0.7–4.0)
MCH: 32.7 pg (ref 26.0–34.0)
MCHC: 33 g/dL (ref 30.0–36.0)
MCV: 99.1 fL (ref 80.0–100.0)
Monocytes Absolute: 0.7 10*3/uL (ref 0.1–1.0)
Monocytes Relative: 7 %
Neutro Abs: 7.9 10*3/uL — ABNORMAL HIGH (ref 1.7–7.7)
Neutrophils Relative %: 84 %
Platelets: 35 10*3/uL — ABNORMAL LOW (ref 150–400)
RBC: 3.52 MIL/uL — ABNORMAL LOW (ref 4.22–5.81)
RDW: 23.9 % — ABNORMAL HIGH (ref 11.5–15.5)
WBC: 9.4 10*3/uL (ref 4.0–10.5)
nRBC: 0.2 % (ref 0.0–0.2)

## 2019-05-22 LAB — PROTIME-INR
INR: 2.3 — ABNORMAL HIGH (ref 0.8–1.2)
Prothrombin Time: 24.9 seconds — ABNORMAL HIGH (ref 11.4–15.2)

## 2019-05-22 LAB — COMPREHENSIVE METABOLIC PANEL
ALT: 320 U/L — ABNORMAL HIGH (ref 0–44)
AST: 210 U/L — ABNORMAL HIGH (ref 15–41)
Albumin: 2.4 g/dL — ABNORMAL LOW (ref 3.5–5.0)
Alkaline Phosphatase: 90 U/L (ref 38–126)
Anion gap: 12 (ref 5–15)
BUN: 43 mg/dL — ABNORMAL HIGH (ref 6–20)
CO2: 22 mmol/L (ref 22–32)
Calcium: 7.8 mg/dL — ABNORMAL LOW (ref 8.9–10.3)
Chloride: 100 mmol/L (ref 98–111)
Creatinine, Ser: 3.8 mg/dL — ABNORMAL HIGH (ref 0.61–1.24)
GFR calc Af Amer: 21 mL/min — ABNORMAL LOW (ref >60)
GFR calc non Af Amer: 18 mL/min — ABNORMAL LOW (ref >60)
Glucose, Bld: 140 mg/dL — ABNORMAL HIGH (ref 70–99)
Potassium: 4.2 mmol/L (ref 3.5–5.1)
Sodium: 134 mmol/L — ABNORMAL LOW (ref 135–145)
Total Bilirubin: 21.2 mg/dL (ref 0.3–1.2)
Total Protein: 5.4 g/dL — ABNORMAL LOW (ref 6.5–8.1)

## 2019-05-22 LAB — MITOCHONDRIAL ANTIBODIES: Mitochondrial M2 Ab, IgG: <20 Units (ref 0.0–20.0)

## 2019-05-22 LAB — ANTI-SMOOTH MUSCLE ANTIBODY, IGG: F-Actin IgG: 21 Units — ABNORMAL HIGH (ref 0–19)

## 2019-05-22 MED ORDER — VITAMIN K1 10 MG/ML IJ SOLN
10.0000 mg | Freq: Once | INTRAVENOUS | Status: AC
  Administered 2019-05-22: 10 mg via INTRAVENOUS
  Filled 2019-05-22: qty 1

## 2019-05-22 MED ORDER — SEVELAMER CARBONATE 800 MG PO TABS: 2400.0000 mg | ORAL_TABLET | Freq: Three times a day (TID) | ORAL | Status: DC

## 2019-05-22 NOTE — Progress Notes (Signed)
[facility] [place] NAME: Brett Small    MR#:  [id number]  DATE OF BIRTH:  [birth year]  SUBJECTIVE:  CHIEF COMPLAINT:   Chief Complaint  Patient presents with  . Hematemesis   No bleeding. No abd pain  REVIEW OF SYSTEMS:    Review of Systems  Constitutional: Positive for malaise/fatigue. Negative for chills and fever.  HENT: Negative for sore throat.   Eyes: Negative for blurred vision, double vision and pain.  Respiratory: Negative for cough, hemoptysis, shortness of breath and wheezing.   Cardiovascular: Negative for chest pain, palpitations, orthopnea and leg swelling.  Gastrointestinal: Negative for abdominal pain, constipation, diarrhea, heartburn, nausea and vomiting.  Genitourinary: Negative for dysuria and hematuria.  Musculoskeletal: Negative for back pain and joint pain.  Skin: Negative for rash.  Neurological: Negative for sensory change, speech change, focal weakness and headaches.  Endo/Heme/Allergies: Does not bruise/bleed easily.  Psychiatric/Behavioral: Negative for depression. The patient is not nervous/anxious.    DRUG ALLERGIES:   Allergies  Allergen Reactions  . Codeine Nausea Only    Patient questioned this (??)  . Sulfa Antibiotics Hives and Nausea And Vomiting    VITALS:  Blood pressure (!) 142/95, pulse 97, temperature 98.1 F (36.7 C), temperature source Oral, resp. rate 20, height 5\' 1"  (1.549 m), weight 50.9 kg, SpO2 92 %.  PHYSICAL EXAMINATION:   Physical Exam  GENERAL:  43 y.o.-year-old patient lying in the bed with no acute distress.  EYES: Pupils equal, round, reactive to light and accommodation. No scleral icterus. Extraocular muscles intact.  HEENT: Head atraumatic, normocephalic. Oropharynx and nasopharynx clear.  NECK:  Supple, no jugular venous distention. No thyroid enlargement, no tenderness.  LUNGS: Normal breath sounds bilaterally, no wheezing, rales, rhonchi. No use of accessory  muscles of respiration.  CARDIOVASCULAR: S1, S2 normal. No murmurs, rubs, or gallops.  ABDOMEN: Soft, nontender, nondistended. Bowel sounds present. No organomegaly or mass.  EXTREMITIES: No cyanosis, clubbing or edema b/l.    NEUROLOGIC: Cranial nerves II through XII are intact. No focal Motor or sensory deficits b/l.   PSYCHIATRIC: The patient is alert and oriented x 3.  SKIN: No obvious rash, lesion, or ulcer.   LABORATORY PANEL:   CBC Recent Labs  Lab 05/22/19 0434  WBC 9.4  HGB 11.5*  HCT 34.9*  PLT 35*   ------------------------------------------------------------------------------------------------------------------ Chemistries  Recent Labs  Lab 05/22/19 0434  NA 134*  K 4.2  CL 100  CO2 22  GLUCOSE 140*  BUN 43*  CREATININE 3.80*  CALCIUM 7.8*  AST 210*  ALT 320*  ALKPHOS 90  BILITOT 21.2*   ------------------------------------------------------------------------------------------------------------------  Cardiac Enzymes No results for input(s): TROPONINI in the last 168 hours. ------------------------------------------------------------------------------------------------------------------  RADIOLOGY:  US Liver Doppler  Result Date: 05/21/2019 CLINICAL DATA:  Liver failure.  Hepatitis. EXAM: DUPLEX ULTRASOUND OF LIVER TECHNIQUE: Color and duplex Doppler ultrasound was performed to evaluate the hepatic in-flow and out-flow vessels. COMPARISON:  CT scan of the abdomen and pelvis - 05/19/2019; abdominal ultrasound - 05/19/2019 FINDINGS: Liver: Mild diffuse increased echogenicity of the hepatic parenchyma. No discrete hepatic lesions. Small amount of intra-abdominal ascites. No intra or extrahepatic biliary ductal dilatation. Main Portal Vein size: 1.0 cm Portal Vein Velocities - normal directional flow is demonstrated throughout the portal system. Main Prox:  29 cm/sec Main Mid: 23 cm/sec Main Dist:  29 cm/sec Right: 12 cm/sec Left: 21 cm/sec Hepatic Vein  Velocities - bidirectional flow is demonstrated within the hepatic venous system.  Right:  32 cm/sec Middle:  53 cm/sec Left:  41 cm/sec IVC: Present and patent with normal respiratory phasicity. Hepatic Artery Velocity:  75.6 cm/sec Splenic Vein Velocity:  11.1 cm/sec Spleen: 8.5 cm x 10.2 cm x 4.2 cm with a total volume of 190.5 cm^3 (411 cm^3 is upper limit normal) Portal Vein Occlusion/Thrombus: No Splenic Vein Occlusion/Thrombus: No Ascites: Small volume intra-abdominal ascites Varices: None Note is also made of a small left-sided pleural effusion (image 50). IMPRESSION: 1. Portal venous system is widely patent with normal directional flow. 2. Bidirectional flow demonstrated within the hepatic venous system as could be seen in the setting of right-sided heart failure. 3. Echogenic liver with small volume intra-abdominal ascites compatible with provided history liver failure. 4. Incidental note made of a small left-sided pleural effusion, new compared to CT scan the abdomen pelvis performed 05/19/2019. Further evaluation with dedicated chest radiograph could be performed as indicated. Electronically Signed   By: Sandi Mariscal M.D.   On: 05/21/2019 14:55   ASSESSMENT AND PLAN:    * Acute liver failure -unclear etiology upfront.  Acute hepatitis panel pending.  Patient was HIV Negative on 12/2018.  Patient has gallbladder sludge on ultrasound, but no significant pathology within the common bile duct.  Tylenol <10.   GI consulted and working him up Liver doppler US pending We will give vitamin K again today. INR at 2.6 Discussed with GI Dr. Bonna Gains.  Still waiting on some blood work for etiology.   * Hematemesis - likely related to his coagulopathy related to his liver failure and thrombocytopenia  No active bleeding seen at this time. On Protonix. Stop octreotide.  IV ceftriaxone.  * Thrombocytopenia-still low at 36K Will transfuse platelets prior to EGD or if <20k No Schistocytes Hem-onc on  board.  *  End stage renal disease on dialysis [facility]) - nephrology consult for dialysis support  *  Hypertension -continue home meds  Accepted at [facility] to hear regarding bed availability.  All the records are reviewed and case discussed with Care Management/Social Worker Management plans discussed with the patient, family and they are in agreement.  CODE STATUS: FULL CODE  TOTAL TIME TAKING CARE OF THIS PATIENT: 30 minutes.   Leia Alf Cyrilla Durkin M.D on 05/22/2019 at 1:02 PM  Between 7am to 6pm - Pager - [phone]  After 6pm go to [email or web address] - password [email or web address] [place] [facility]  Office  [phone]  CC: Primary care physician; Patient, [name]  Note: This dictation was prepared with Dragon dictation along with smaller phrase technology. Any transcriptional errors that result from this process are unintentional.

## 2019-05-22 NOTE — Progress Notes (Signed)
[facility]  ROUNDING NOTE   Subjective:   Hemodialysis treatment yesterday. Tolerated treatment well. UF of 2 liters.   Eating breakfast this morning. Complains nausea. States he vomited yesterday.    Objective:  Vital signs in last 24 hours:  Temp:  [97.5 F (36.4 C)-98.1 F (36.7 C)] 98.1 F (36.7 C) (07/31 0449) Pulse Rate:  [88-97] 97 (07/31 0449) Resp:  [10-22] 20 (07/31 0449) BP: (135-162)/(80-106) 142/95 (07/31 0449) SpO2:  [92 %-98 %] 92 % (07/31 0449)  Weight change:  Filed Weights   05/19/19 0616 05/21/19 0800 05/21/19 0835  Weight: 47.6 kg 50.9 kg 50.9 kg    Intake/Output: I/O last 3 completed shifts: In: 1080.6 [P.O.:120; I.V.:860.6; IV Piggyback:100] Out: 2008 [Other:2008]   Intake/Output this shift:  No intake/output data recorded.  Physical Exam: General: NAD, laying in bed  Head: Normocephalic, atraumatic. Moist oral mucosal membranes  Eyes: +icteric sclera  Neck: Supple   Lungs:  Clear to auscultation  Heart: Regular rate and rhythm  Abdomen:  Soft, nontender  Extremities: no peripheral edema.  Neurologic: nonfocal  Skin: +jaundice  Access: Left AVG    Basic Metabolic Panel: Recent Labs  Lab 05/19/19 0209 05/19/19 0708 05/20/19 0644 05/21/19 0605 05/22/19 0434  NA 132* 133* 136 132* 134*  K 4.6 4.8 4.3 5.9* 4.2  CL 88* 90* 99 96* 100  CO2 13* 17* 22 17* 22  GLUCOSE 99 114* 125* 96 140*  BUN 87* 98* 48* 69* 43*  CREATININE 7.01* 7.32* 4.10* 5.38* 3.80*  CALCIUM 8.2* 7.8* 7.8* 7.9* 7.8*    Liver Function Tests: Recent Labs  Lab 05/19/19 0209 05/19/19 0708 05/20/19 0644 05/21/19 0605 05/22/19 0434  AST 379* 380* 240* 279* 210*  ALT 679* 607* 438* 390* 320*  ALKPHOS 95 87 81 80 90  BILITOT 14.0* 12.7* 15.0* 17.9* 21.2*  PROT 5.8* 5.2* 5.2* 5.1* 5.4*  ALBUMIN 3.1* 2.7* 2.5* 2.5* 2.4*   Recent Labs  Lab 05/19/19 0209  LIPASE 79*   No results for input(s): AMMONIA in the last 168 hours.  CBC: Recent Labs   Lab 05/19/19 0209 05/19/19 0708 05/20/19 0644 05/21/19 0605 05/22/19 0434  WBC 12.0* 8.8 8.3 9.7 9.4  NEUTROABS  --  7.4 7.1 8.1* 7.9*  HGB 11.9* 10.3* 11.1* 11.4* 11.5*  HCT 36.7* 31.1* 33.3* 34.9* 34.9*  MCV 99.5 98.4 98.2 98.9 99.1  PLT 28* 24* 35* 36* 35*    Cardiac Enzymes: No results for input(s): CKTOTAL, CKMB, CKMBINDEX, TROPONINI in the last 168 hours.  BNP: Invalid input(s): POCBNP  CBG: No results for input(s): GLUCAP in the last 168 hours.  Microbiology: Results for orders placed or performed during the hospital encounter of 05/19/19  SARS Coronavirus 2 (CEPHEID - Performed in [facility] hospital lab), Hosp Order     Status: None   Collection Time: 05/19/19  5:30 AM   Specimen: Nasopharyngeal Swab  Result Value Ref Range Status   SARS Coronavirus 2 NEGATIVE NEGATIVE Final    Comment: (NOTE) If result is NEGATIVE SARS-CoV-2 target nucleic acids are NOT DETECTED. The SARS-CoV-2 RNA is generally detectable in upper and lower  respiratory specimens during the acute phase of infection. The lowest  concentration of SARS-CoV-2 viral copies this assay can detect is 250  copies / mL. A negative result does not preclude SARS-CoV-2 infection  and should not be used as the sole basis for treatment or other  patient management decisions.  A negative result may occur with  improper specimen  collection / handling, submission of specimen other  than nasopharyngeal swab, presence of viral mutation(s) within the  areas targeted by this assay, and inadequate number of viral copies  (<250 copies / mL). A negative result must be combined with clinical  observations, patient history, and epidemiological information. If result is POSITIVE SARS-CoV-2 target nucleic acids are DETECTED. The SARS-CoV-2 RNA is generally detectable in upper and lower  respiratory specimens dur ing the acute phase of infection.  Positive  results are indicative of active infection with SARS-CoV-2.   Clinical  correlation with patient history and other diagnostic information is  necessary to determine patient infection status.  Positive results do  not rule out bacterial infection or co-infection with other viruses. If result is PRESUMPTIVE POSTIVE SARS-CoV-2 nucleic acids MAY BE PRESENT.   A presumptive positive result was obtained on the submitted specimen  and confirmed on repeat testing.  While 2019 novel coronavirus  (SARS-CoV-2) nucleic acids may be present in the submitted sample  additional confirmatory testing may be necessary for epidemiological  and / or clinical management purposes  to differentiate between  SARS-CoV-2 and other Sarbecovirus currently known to infect humans.  If clinically indicated additional testing with an alternate test  methodology [phone]) is advised. The SARS-CoV-2 RNA is generally  detectable in upper and lower respiratory sp ecimens during the acute  phase of infection. The expected result is Negative. Fact Sheet for Patients:  StrictlyIdeas.no Fact Sheet for Healthcare Providers: BankingDealers.co.za This test is not yet approved or cleared by the [place] FDA and has been authorized for detection and/or diagnosis of SARS-CoV-2 by FDA under an Emergency Use Authorization (EUA).  This EUA will remain in effect (meaning this test can be used) for the duration of the COVID-19 declaration under Section 564(b)(1) of the Act, 21 U.S.C. section 360bbb-3(b)(1), unless the authorization is terminated or revoked sooner. Performed at [facility], [address] [place], [place] [address]   MRSA PCR Screening     Status: None   Collection Time: 05/19/19  6:15 AM   Specimen: Nasal Mucosa; Nasopharyngeal  Result Value Ref Range Status   MRSA by PCR NEGATIVE NEGATIVE Final    Comment:        The GeneXpert MRSA Assay (FDA approved for NASAL specimens only), is one component of  a comprehensive MRSA colonization surveillance program. It is not intended to diagnose MRSA infection nor to guide or monitor treatment for MRSA infections. Performed at [facility], [address] [place], [place] [address]   Culture, blood (Routine X 2) w Reflex to ID Panel     Status: None (Preliminary result)   Collection Time: 05/19/19  7:08 AM   Specimen: BLOOD  Result Value Ref Range Status   Specimen Description BLOOD RIGHT HAND  Final   Special Requests   Final    BOTTLES DRAWN AEROBIC AND ANAEROBIC Blood Culture results may not be optimal due to an excessive volume of blood received in culture bottles   Culture   Final    NO GROWTH 3 DAYS Performed at [facility], [address], [place], [place] [address]    Report Status PENDING  Incomplete  Culture, blood (Routine X 2) w Reflex to ID Panel     Status: None (Preliminary result)   Collection Time: 05/19/19  8:48 AM   Specimen: BLOOD  Result Value Ref Range Status   Specimen Description BLOOD RIGHT HAND  Final   Special Requests   Final  BOTTLES DRAWN AEROBIC ONLY Blood Culture results may not be optimal due to an excessive volume of blood received in culture bottles   Culture   Final    NO GROWTH 3 DAYS Performed at [facility], [address] [place], [place] [address]    Report Status PENDING  Incomplete    Coagulation Studies: Recent Labs    05/20/19 0644 05/21/19 0712 05/22/19 0434  LABPROT 28.1* 27.8* 24.9*  INR 2.7* 2.6* 2.3*    Urinalysis: No results for input(s): COLORURINE, LABSPEC, PHURINE, GLUCOSEU, HGBUR, BILIRUBINUR, KETONESUR, PROTEINUR, UROBILINOGEN, NITRITE, LEUKOCYTESUR in the last 72 hours.  Invalid input(s): APPERANCEUR    Imaging: US Liver Doppler  Result Date: 05/21/2019 CLINICAL DATA:  Liver failure.  Hepatitis. EXAM: DUPLEX ULTRASOUND OF LIVER TECHNIQUE: Color and duplex Doppler ultrasound was performed to evaluate the hepatic in-flow and  out-flow vessels. COMPARISON:  CT scan of the abdomen and pelvis - 05/19/2019; abdominal ultrasound - 05/19/2019 FINDINGS: Liver: Mild diffuse increased echogenicity of the hepatic parenchyma. No discrete hepatic lesions. Small amount of intra-abdominal ascites. No intra or extrahepatic biliary ductal dilatation. Main Portal Vein size: 1.0 cm Portal Vein Velocities - normal directional flow is demonstrated throughout the portal system. Main Prox:  29 cm/sec Main Mid: 23 cm/sec Main Dist:  29 cm/sec Right: 12 cm/sec Left: 21 cm/sec Hepatic Vein Velocities - bidirectional flow is demonstrated within the hepatic venous system. Right:  32 cm/sec Middle:  53 cm/sec Left:  41 cm/sec IVC: Present and patent with normal respiratory phasicity. Hepatic Artery Velocity:  75.6 cm/sec Splenic Vein Velocity:  11.1 cm/sec Spleen: 8.5 cm x 10.2 cm x 4.2 cm with a total volume of 190.5 cm^3 (411 cm^3 is upper limit normal) Portal Vein Occlusion/Thrombus: No Splenic Vein Occlusion/Thrombus: No Ascites: Small volume intra-abdominal ascites Varices: None Note is also made of a small left-sided pleural effusion (image 50). IMPRESSION: 1. Portal venous system is widely patent with normal directional flow. 2. Bidirectional flow demonstrated within the hepatic venous system as could be seen in the setting of right-sided heart failure. 3. Echogenic liver with small volume intra-abdominal ascites compatible with provided history liver failure. 4. Incidental note made of a small left-sided pleural effusion, new compared to CT scan the abdomen pelvis performed 05/19/2019. Further evaluation with dedicated chest radiograph could be performed as indicated. Electronically Signed   By: Sandi Mariscal M.D.   On: 05/21/2019 14:55     Medications:   . sodium chloride 250 mL (05/22/19 0839)  . cefTRIAXone (ROCEPHIN)  IV Stopped (05/21/19 1941)  . sodium chloride     . pantoprazole  40 mg Intravenous Q12H   sodium chloride, ondansetron **OR**  ondansetron (ZOFRAN) IV, promethazine, traMADol  Assessment/ Plan:  [name] is a 43 y.o. white male with end stage renal disease on hemodialysis, hypertension, peripheral vascular disease, depression who is admitted to [facility] on 05/19/2019 for Hepatitis [K75.9] Hematemesis with nausea [K92.0] Acute liver failure without hepatic coma [K72.00]  [place]. TTS 48kg Left AVG  1. End Stage Renal Disease: tolerated treatment well yesterday.   2. Hypertension: holding blood pressure medications. Home regimen of amlodipine, carvedilol, and hydralazine.  3. Anemia of chronic kidney disease: hemoglobin 11.5 - Holding EPO  4. Secondary Hyperparathyroidism: outpatient labs on 7/16 PTH 929, phosphorus 3.9, calcium 8.4.  - sevelamer with meals   5. Acute liver failure with jaundice: new presentation. Patient denies alcohol use. Denies IVDA Total bilirubin rising.  Acute viral hepatitis panel negative  this admission in addition to being negative in March, April and May.  RUQ ultrasound with gallbladder sludge.  Plan to transfer patient to tertiary care center.    LOS: 3 Krystie Leiter 7/31/202010:54 AM

## 2019-05-22 NOTE — Care Management Important Message (Signed)
Important Message  Patient Details  Name: Brett Small MRN: [id number] Date of Birth: 1975/12/18   Medicare Important Message Given:  Yes     Dannette Barbara 05/22/2019, 10:57 AM

## 2019-05-22 NOTE — Progress Notes (Signed)
05/22/2019 3:39 PM  Spoke with [facility] transfer center, received room assignment.  Gave relevant info to [employer].  Notified patienht and got documentation ready.  Will continue to monirot and assess patient until transport arrives.  Dola Argyle, RN

## 2019-05-22 NOTE — Progress Notes (Signed)
Lab called with a critical total bilirubin of 21.2. I paged hospitalist and Dr Marcille Blanco returned my page. He said since patient is awaiting a transfer to another facility for a higher level of care and is not getting fluids due to being a dialysis patient just to continue to monitor for now.

## 2019-05-22 NOTE — Progress Notes (Signed)
Vonda Antigua, MD [address] [place], [place], [place], [address] [address], [place], [place], [place], [address] Phone: [phone]  Fax: [phone]   Subjective: No nausea vomiting, abdominal pain, melena.  Tolerating oral diet.   Objective: Exam: Vital signs in last 24 hours: Vitals:   05/21/19 1215 05/21/19 1410 05/21/19 2113 05/22/19 0449  BP: (!) 146/80 135/80 (!) 138/91 (!) 142/95  Pulse: 89 88 91 97  Resp: '19 16 20 20  ' Temp: 97.8 F (36.6 C) 97.8 F (36.6 C) (!) 97.5 F (36.4 C) 98.1 F (36.7 C)  TempSrc: Oral Oral Oral Oral  SpO2: 98% 96% 96% 92%  Weight:      Height:       Weight change:   Intake/Output Summary (Last 24 hours) at 05/22/2019 1605 Last data filed at 05/22/2019 0300 Gross per 24 hour  Intake 1080.64 ml  Output --  Net 1080.64 ml    General: No acute distress, AAO x3 Abd: Soft, NT/ND, No HSM Skin: Warm, no rashes Neck: Supple, Trachea midline   Lab Results: Lab Results  Component Value Date   WBC 9.4 05/22/2019   HGB 11.5 (L) 05/22/2019   HCT 34.9 (L) 05/22/2019   MCV 99.1 05/22/2019   PLT 35 (L) 05/22/2019   Micro Results: Recent Results (from the past 240 hour(s))  SARS Coronavirus 2 (CEPHEID - Performed in [facility] hospital lab), Hosp Order     Status: None   Collection Time: 05/19/19  5:30 AM   Specimen: Nasopharyngeal Swab  Result Value Ref Range Status   SARS Coronavirus 2 NEGATIVE NEGATIVE Final    Comment: (NOTE) If result is NEGATIVE SARS-CoV-2 target nucleic acids are NOT DETECTED. The SARS-CoV-2 RNA is generally detectable in upper and lower  respiratory specimens during the acute phase of infection. The lowest  concentration of SARS-CoV-2 viral copies this assay can detect is 250  copies / mL. A negative result does not preclude SARS-CoV-2 infection  and should not be used as the sole basis for treatment or other  patient management decisions.  A negative result may occur with  improper  specimen collection / handling, submission of specimen other  than nasopharyngeal swab, presence of viral mutation(s) within the  areas targeted by this assay, and inadequate number of viral copies  (<250 copies / mL). A negative result must be combined with clinical  observations, patient history, and epidemiological information. If result is POSITIVE SARS-CoV-2 target nucleic acids are DETECTED. The SARS-CoV-2 RNA is generally detectable in upper and lower  respiratory specimens dur ing the acute phase of infection.  Positive  results are indicative of active infection with SARS-CoV-2.  Clinical  correlation with patient history and other diagnostic information is  necessary to determine patient infection status.  Positive results do  not rule out bacterial infection or co-infection with other viruses. If result is PRESUMPTIVE POSTIVE SARS-CoV-2 nucleic acids MAY BE PRESENT.   A presumptive positive result was obtained on the submitted specimen  and confirmed on repeat testing.  While 2019 novel coronavirus  (SARS-CoV-2) nucleic acids may be present in the submitted sample  additional confirmatory testing may be necessary for epidemiological  and / or clinical management purposes  to differentiate between  SARS-CoV-2 and other Sarbecovirus currently known to infect humans.  If clinically indicated additional testing with an alternate test  methodology [phone]) is advised. The SARS-CoV-2 RNA is generally  detectable in upper and lower respiratory sp ecimens during the acute  phase  of infection. The expected result is Negative. Fact Sheet for Patients:  StrictlyIdeas.no Fact Sheet for Healthcare Providers: BankingDealers.co.za This test is not yet approved or cleared by the [place] FDA and has been authorized for detection and/or diagnosis of SARS-CoV-2 by FDA under an Emergency Use Authorization (EUA).  This EUA will remain in  effect (meaning this test can be used) for the duration of the COVID-19 declaration under Section 564(b)(1) of the Act, 21 U.S.C. section 360bbb-3(b)(1), unless the authorization is terminated or revoked sooner. Performed at [facility], [address] [place], [place] [address]   MRSA PCR Screening     Status: None   Collection Time: 05/19/19  6:15 AM   Specimen: Nasal Mucosa; Nasopharyngeal  Result Value Ref Range Status   MRSA by PCR NEGATIVE NEGATIVE Final    Comment:        The GeneXpert MRSA Assay (FDA approved for NASAL specimens only), is one component of a comprehensive MRSA colonization surveillance program. It is not intended to diagnose MRSA infection nor to guide or monitor treatment for MRSA infections. Performed at [facility], [address] [place], [place] [address]   Culture, blood (Routine X 2) w Reflex to ID Panel     Status: None (Preliminary result)   Collection Time: 05/19/19  7:08 AM   Specimen: BLOOD  Result Value Ref Range Status   Specimen Description BLOOD RIGHT HAND  Final   Special Requests   Final    BOTTLES DRAWN AEROBIC AND ANAEROBIC Blood Culture results may not be optimal due to an excessive volume of blood received in culture bottles   Culture   Final    NO GROWTH 3 DAYS Performed at [facility], [address] [place], [place] [address]    Report Status PENDING  Incomplete  Culture, blood (Routine X 2) w Reflex to ID Panel     Status: None (Preliminary result)   Collection Time: 05/19/19  8:48 AM   Specimen: BLOOD  Result Value Ref Range Status   Specimen Description BLOOD RIGHT HAND  Final   Special Requests   Final    BOTTLES DRAWN AEROBIC ONLY Blood Culture results may not be optimal due to an excessive volume of blood received in culture bottles   Culture   Final    NO GROWTH 3 DAYS Performed at [facility], [address], [place], [place] [address]    Report Status PENDING   Incomplete   Studies/Results: US Liver Doppler  Result Date: 05/21/2019 CLINICAL DATA:  Liver failure.  Hepatitis. EXAM: DUPLEX ULTRASOUND OF LIVER TECHNIQUE: Color and duplex Doppler ultrasound was performed to evaluate the hepatic in-flow and out-flow vessels. COMPARISON:  CT scan of the abdomen and pelvis - 05/19/2019; abdominal ultrasound - 05/19/2019 FINDINGS: Liver: Mild diffuse increased echogenicity of the hepatic parenchyma. No discrete hepatic lesions. Small amount of intra-abdominal ascites. No intra or extrahepatic biliary ductal dilatation. Main Portal Vein size: 1.0 cm Portal Vein Velocities - normal directional flow is demonstrated throughout the portal system. Main Prox:  29 cm/sec Main Mid: 23 cm/sec Main Dist:  29 cm/sec Right: 12 cm/sec Left: 21 cm/sec Hepatic Vein Velocities - bidirectional flow is demonstrated within the hepatic venous system. Right:  32 cm/sec Middle:  53 cm/sec Left:  41 cm/sec IVC: Present and patent with normal respiratory phasicity. Hepatic Artery Velocity:  75.6 cm/sec Splenic Vein Velocity:  11.1 cm/sec Spleen: 8.5 cm x 10.2 cm x 4.2 cm with a total  volume of 190.5 cm^3 (411 cm^3 is upper limit normal) Portal Vein Occlusion/Thrombus: No Splenic Vein Occlusion/Thrombus: No Ascites: Small volume intra-abdominal ascites Varices: None Note is also made of a small left-sided pleural effusion (image 50). IMPRESSION: 1. Portal venous system is widely patent with normal directional flow. 2. Bidirectional flow demonstrated within the hepatic venous system as could be seen in the setting of right-sided heart failure. 3. Echogenic liver with small volume intra-abdominal ascites compatible with provided history liver failure. 4. Incidental note made of a small left-sided pleural effusion, new compared to CT scan the abdomen pelvis performed 05/19/2019. Further evaluation with dedicated chest radiograph could be performed as indicated. Electronically Signed   By: Sandi Mariscal M.D.    On: 05/21/2019 14:55   Medications:  Scheduled Meds:  pantoprazole  40 mg Intravenous Q12H   sevelamer carbonate  2,400 mg Oral TID WC   Continuous Infusions:  sodium chloride 250 mL (05/22/19 0839)   cefTRIAXone (ROCEPHIN)  IV Stopped (05/21/19 1941)   sodium chloride     PRN Meds:.sodium chloride, ondansetron **OR** ondansetron (ZOFRAN) IV, promethazine, traMADol   Assessment: Principal Problem:   Acute liver failure Active Problems:   End stage renal disease on dialysis ([place])   Hypertension   Hematemesis   Hepatitis   Thrombocytopenia (HCC)   Coagulopathy (HCC)    Plan: Source of his acute liver failure remains unknown.  MRCP pending today  Coagulopathy improving  However, bilirubin continues to increase but transaminases have improved overall.  Alk phos is normal.  Therefore suspicion for biliary duct obstruction is low.  In addition he does not have any abdominal pain.  Suspect drug-induced liver injury from unknown source from a medication or supplement patient may have taken and does not remember.  F-actin antibody weakly positive at 21  Rest of the viral and autoimmune hepatitis work-up is resulted negative including CMV DNA PCR.  HSV DNA PCR has been ordered as well.  Continue to avoid hepatotoxic drugs  Continue daily CMP  If liver enzymes continue to worsen and MRCP is unrevealing, and liver biopsy would be next step   LOS: 3 days   Vonda Antigua, MD 05/22/2019, 4:05 PM

## 2019-05-24 LAB — BLOOD CULTURE ID PANEL (REFLEXED)

## 2019-05-24 LAB — CULTURE, BLOOD (ROUTINE X 2): Culture: NO GROWTH

## 2019-05-24 NOTE — Progress Notes (Signed)
PHARMACY - PHYSICIAN COMMUNICATION CRITICAL VALUE ALERT - BLOOD CULTURE IDENTIFICATION (BCID)  Brett Small is an 43 y.o. male who presented to [facility] on 05/19/2019 with a chief complaint of coffee-ground hematemesis s/t acute hepatic failure w/ thrombocytopenia, and elevated LFTs and Tbili w/ ascites and incidental pleural effusion on abdominal US  Assessment:  1/4 anaerobic GPC BCID staph species MecA+, LA 5.3 >> 3.4, w/ left shift.   Name of physician (or Provider) Contacted: Spoke to Sas in [place] central pharmacy; which she states she could not pull up in her system in [place] and that if they're transferred there they get a new set of labs/BCx  Current antibiotics: none patient has been transferred  Changes to prescribed antibiotics recommended:  Could not pass off results since it is unknown where patient was transferred to exactly  Results for orders placed or performed during the hospital encounter of 05/19/19  Blood Culture ID Panel (Reflexed) (Collected: 05/19/2019  7:08 AM)  Result Value Ref Range   Enterococcus species NOT DETECTED NOT DETECTED   Listeria monocytogenes NOT DETECTED NOT DETECTED   Staphylococcus species DETECTED (A) NOT DETECTED   Staphylococcus aureus (BCID) NOT DETECTED NOT DETECTED   Methicillin resistance DETECTED (A) NOT DETECTED   Streptococcus species NOT DETECTED NOT DETECTED   Streptococcus agalactiae NOT DETECTED NOT DETECTED   Streptococcus pneumoniae NOT DETECTED NOT DETECTED   Streptococcus pyogenes NOT DETECTED NOT DETECTED   Acinetobacter baumannii NOT DETECTED NOT DETECTED   Enterobacteriaceae species NOT DETECTED NOT DETECTED   Enterobacter cloacae complex NOT DETECTED NOT DETECTED   Escherichia coli NOT DETECTED NOT DETECTED   Klebsiella oxytoca NOT DETECTED NOT DETECTED   Klebsiella pneumoniae NOT DETECTED NOT DETECTED   Proteus species NOT DETECTED NOT DETECTED   Serratia marcescens NOT DETECTED NOT DETECTED   Haemophilus influenzae  NOT DETECTED NOT DETECTED   Neisseria meningitidis NOT DETECTED NOT DETECTED   Pseudomonas aeruginosa NOT DETECTED NOT DETECTED   Candida albicans NOT DETECTED NOT DETECTED   Candida glabrata NOT DETECTED NOT DETECTED   Candida krusei NOT DETECTED NOT DETECTED   Candida parapsilosis NOT DETECTED NOT DETECTED   Candida tropicalis NOT DETECTED NOT DETECTED   Tobie Lords, PharmD, BCPS Clinical Pharmacist 05/24/2019  6:31 AM

## 2019-05-25 LAB — CULTURE, BLOOD (ROUTINE X 2)

## 2019-06-19 ENCOUNTER — Encounter (INDEPENDENT_AMBULATORY_CARE_PROVIDER_SITE_OTHER): Payer: Medicare Other | Admitting: Vascular Surgery

## 2019-07-02 ENCOUNTER — Inpatient Hospital Stay
Admission: EM | Admit: 2019-07-02 | Discharge: 2019-07-06 | DRG: 193 | Disposition: A | Discharge status: Home Health | Payer: Medicare Other | Attending: Internal Medicine | Admitting: Internal Medicine

## 2019-07-02 ENCOUNTER — Other Ambulatory Visit: Payer: Self-pay

## 2019-07-02 ENCOUNTER — Emergency Department: Payer: Medicare Other

## 2019-07-02 DIAGNOSIS — Z992 Dependence on renal dialysis: Secondary | ICD-10-CM

## 2019-07-02 DIAGNOSIS — I129 Hypertensive chronic kidney disease with stage 1 through stage 4 chronic kidney disease, or unspecified chronic kidney disease: Secondary | ICD-10-CM | POA: Diagnosis present

## 2019-07-02 DIAGNOSIS — F329 Major depressive disorder, single episode, unspecified: Secondary | ICD-10-CM | POA: Diagnosis present

## 2019-07-02 DIAGNOSIS — Z885 Allergy status to narcotic agent status: Secondary | ICD-10-CM

## 2019-07-02 DIAGNOSIS — Z7901 Long term (current) use of anticoagulants: Secondary | ICD-10-CM | POA: Diagnosis not present

## 2019-07-02 DIAGNOSIS — K7290 Hepatic failure, unspecified without coma: Secondary | ICD-10-CM | POA: Diagnosis present

## 2019-07-02 DIAGNOSIS — D696 Thrombocytopenia, unspecified: Secondary | ICD-10-CM | POA: Diagnosis present

## 2019-07-02 DIAGNOSIS — Z833 Family history of diabetes mellitus: Secondary | ICD-10-CM | POA: Diagnosis not present

## 2019-07-02 DIAGNOSIS — J440 Chronic obstructive pulmonary disease with acute lower respiratory infection: Secondary | ICD-10-CM | POA: Diagnosis present

## 2019-07-02 DIAGNOSIS — I739 Peripheral vascular disease, unspecified: Secondary | ICD-10-CM | POA: Diagnosis present

## 2019-07-02 DIAGNOSIS — Z882 Allergy status to sulfonamides status: Secondary | ICD-10-CM

## 2019-07-02 DIAGNOSIS — N2581 Secondary hyperparathyroidism of renal origin: Secondary | ICD-10-CM | POA: Diagnosis present

## 2019-07-02 DIAGNOSIS — Z20828 Contact with and (suspected) exposure to other viral communicable diseases: Secondary | ICD-10-CM | POA: Diagnosis present

## 2019-07-02 DIAGNOSIS — D631 Anemia in chronic kidney disease: Secondary | ICD-10-CM | POA: Diagnosis present

## 2019-07-02 DIAGNOSIS — E785 Hyperlipidemia, unspecified: Secondary | ICD-10-CM | POA: Diagnosis present

## 2019-07-02 DIAGNOSIS — Z7982 Long term (current) use of aspirin: Secondary | ICD-10-CM

## 2019-07-02 DIAGNOSIS — G629 Polyneuropathy, unspecified: Secondary | ICD-10-CM | POA: Diagnosis present

## 2019-07-02 DIAGNOSIS — Z79891 Long term (current) use of opiate analgesic: Secondary | ICD-10-CM

## 2019-07-02 DIAGNOSIS — Z79899 Other long term (current) drug therapy: Secondary | ICD-10-CM

## 2019-07-02 DIAGNOSIS — Z832 Family history of diseases of the blood and blood-forming organs and certain disorders involving the immune mechanism: Secondary | ICD-10-CM

## 2019-07-02 DIAGNOSIS — Y95 Nosocomial condition: Secondary | ICD-10-CM | POA: Diagnosis present

## 2019-07-02 DIAGNOSIS — R079 Chest pain, unspecified: Secondary | ICD-10-CM | POA: Diagnosis present

## 2019-07-02 DIAGNOSIS — N186 End stage renal disease: Secondary | ICD-10-CM | POA: Diagnosis present

## 2019-07-02 DIAGNOSIS — Z8249 Family history of ischemic heart disease and other diseases of the circulatory system: Secondary | ICD-10-CM

## 2019-07-02 DIAGNOSIS — K760 Fatty (change of) liver, not elsewhere classified: Secondary | ICD-10-CM | POA: Diagnosis present

## 2019-07-02 DIAGNOSIS — J189 Pneumonia, unspecified organism: Secondary | ICD-10-CM | POA: Diagnosis present

## 2019-07-02 DIAGNOSIS — J159 Unspecified bacterial pneumonia: Principal | ICD-10-CM | POA: Diagnosis present

## 2019-07-02 DIAGNOSIS — F17210 Nicotine dependence, cigarettes, uncomplicated: Secondary | ICD-10-CM | POA: Diagnosis present

## 2019-07-02 DIAGNOSIS — J9601 Acute respiratory failure with hypoxia: Secondary | ICD-10-CM | POA: Diagnosis present

## 2019-07-02 HISTORY — DX: Secondary hyperparathyroidism of renal origin: N25.81

## 2019-07-02 HISTORY — DX: End stage renal disease: N18.6

## 2019-07-02 LAB — LACTIC ACID, PLASMA
Lactic Acid, Venous: 3.2 mmol/L (ref 0.5–1.9)
Lactic Acid, Venous: 3.5 mmol/L (ref 0.5–1.9)

## 2019-07-02 LAB — BASIC METABOLIC PANEL
Anion gap: 19 — ABNORMAL HIGH (ref 5–15)
BUN: 31 mg/dL — ABNORMAL HIGH (ref 6–20)
CO2: 17 mmol/L — ABNORMAL LOW (ref 22–32)
Calcium: 9.4 mg/dL (ref 8.9–10.3)
Chloride: 100 mmol/L (ref 98–111)
Creatinine, Ser: 5.11 mg/dL — ABNORMAL HIGH (ref 0.61–1.24)
GFR calc Af Amer: 15 mL/min — ABNORMAL LOW (ref >60)
GFR calc non Af Amer: 13 mL/min — ABNORMAL LOW (ref >60)
Glucose, Bld: 130 mg/dL — ABNORMAL HIGH (ref 70–99)
Potassium: 3.7 mmol/L (ref 3.5–5.1)
Sodium: 136 mmol/L (ref 135–145)

## 2019-07-02 LAB — CBC
HCT: 34.9 % — ABNORMAL LOW (ref 39.0–52.0)
Hemoglobin: 11.1 g/dL — ABNORMAL LOW (ref 13.0–17.0)
MCH: 33.6 pg (ref 26.0–34.0)
MCHC: 31.8 g/dL (ref 30.0–36.0)
MCV: 105.8 fL — ABNORMAL HIGH (ref 80.0–100.0)
Platelets: 123 10*3/uL — ABNORMAL LOW (ref 150–400)
RBC: 3.3 MIL/uL — ABNORMAL LOW (ref 4.22–5.81)
RDW: 17.4 % — ABNORMAL HIGH (ref 11.5–15.5)
WBC: 12.5 10*3/uL — ABNORMAL HIGH (ref 4.0–10.5)
nRBC: 0 % (ref 0.0–0.2)

## 2019-07-02 LAB — HEPATIC FUNCTION PANEL
ALT: 14 U/L (ref 0–44)
AST: 19 U/L (ref 15–41)
Albumin: 3.1 g/dL — ABNORMAL LOW (ref 3.5–5.0)
Alkaline Phosphatase: 96 U/L (ref 38–126)
Bilirubin, Direct: 1.7 mg/dL — ABNORMAL HIGH (ref 0.0–0.2)
Indirect Bilirubin: 2.2 mg/dL — ABNORMAL HIGH (ref 0.3–0.9)
Total Bilirubin: 3.9 mg/dL — ABNORMAL HIGH (ref 0.3–1.2)
Total Protein: 6.7 g/dL (ref 6.5–8.1)

## 2019-07-02 LAB — LIPASE, BLOOD: Lipase: 26 U/L (ref 11–51)

## 2019-07-02 LAB — PROTIME-INR
INR: 1.5 — ABNORMAL HIGH (ref 0.8–1.2)
Prothrombin Time: 17.7 seconds — ABNORMAL HIGH (ref 11.4–15.2)

## 2019-07-02 LAB — MRSA PCR SCREENING: MRSA by PCR: NEGATIVE

## 2019-07-02 LAB — SARS CORONAVIRUS 2 BY RT PCR (HOSPITAL ORDER, PERFORMED IN ~~LOC~~ HOSPITAL LAB): SARS Coronavirus 2: NEGATIVE

## 2019-07-02 LAB — TROPONIN I (HIGH SENSITIVITY)
Troponin I (High Sensitivity): 39 ng/L — ABNORMAL HIGH (ref <18)
Troponin I (High Sensitivity): 44 ng/L — ABNORMAL HIGH (ref <18)

## 2019-07-02 MED ORDER — AMLODIPINE BESYLATE 10 MG PO TABS
10.0000 mg | ORAL_TABLET | Freq: Every day | ORAL | Status: DC
  Administered 2019-07-03 – 2019-07-05 (×2): 10 mg via ORAL
  Filled 2019-07-02 (×2): qty 1

## 2019-07-02 MED ORDER — TRAMADOL HCL 50 MG PO TABS
50.0000 mg | ORAL_TABLET | Freq: Two times a day (BID) | ORAL | Status: DC | PRN
  Administered 2019-07-02 – 2019-07-05 (×4): 50 mg via ORAL
  Filled 2019-07-02 (×4): qty 1

## 2019-07-02 MED ORDER — HYDRALAZINE HCL 25 MG PO TABS
25.0000 mg | ORAL_TABLET | Freq: Three times a day (TID) | ORAL | Status: DC
  Administered 2019-07-02 – 2019-07-06 (×8): 25 mg via ORAL
  Filled 2019-07-02 (×8): qty 1

## 2019-07-02 MED ORDER — CLONIDINE HCL 0.1 MG PO TABS
0.2000 mg | ORAL_TABLET | Freq: Once | ORAL | Status: AC
  Administered 2019-07-02: 20:00:00 0.2 mg via ORAL

## 2019-07-02 MED ORDER — GABAPENTIN 100 MG PO CAPS
100.0000 mg | ORAL_CAPSULE | Freq: Three times a day (TID) | ORAL | Status: DC
  Administered 2019-07-02 – 2019-07-06 (×9): 100 mg via ORAL
  Filled 2019-07-02 (×10): qty 1

## 2019-07-02 MED ORDER — ACETAMINOPHEN 650 MG RE SUPP: 650.0000 mg | Freq: Four times a day (QID) | RECTAL | Status: DC | PRN

## 2019-07-02 MED ORDER — LEVOFLOXACIN IN D5W 750 MG/150ML IV SOLN
750.0000 mg | Freq: Once | INTRAVENOUS | Status: DC
  Filled 2019-07-02: qty 150

## 2019-07-02 MED ORDER — ASPIRIN EC 81 MG PO TBEC
81.0000 mg | DELAYED_RELEASE_TABLET | Freq: Every day | ORAL | Status: DC
  Administered 2019-07-03 – 2019-07-06 (×4): 81 mg via ORAL
  Filled 2019-07-02 (×4): qty 1

## 2019-07-02 MED ORDER — ONDANSETRON HCL 4 MG/2ML IJ SOLN
4.0000 mg | Freq: Four times a day (QID) | INTRAMUSCULAR | Status: DC | PRN
  Administered 2019-07-04 – 2019-07-05 (×3): 4 mg via INTRAVENOUS
  Filled 2019-07-02 (×3): qty 2

## 2019-07-02 MED ORDER — ONDANSETRON HCL 4 MG PO TABS: 4.0000 mg | ORAL_TABLET | Freq: Four times a day (QID) | ORAL | Status: DC | PRN

## 2019-07-02 MED ORDER — SODIUM CHLORIDE 0.9 % IV SOLN
INTRAVENOUS | Status: DC | PRN
  Administered 2019-07-02: 22:00:00 500 mL via INTRAVENOUS
  Administered 2019-07-05: 23:00:00 250 mL via INTRAVENOUS

## 2019-07-02 MED ORDER — VANCOMYCIN HCL IN DEXTROSE 1-5 GM/200ML-% IV SOLN
1000.0000 mg | Freq: Once | INTRAVENOUS | Status: AC
  Administered 2019-07-02: 1000 mg via INTRAVENOUS
  Filled 2019-07-02: qty 200

## 2019-07-02 MED ORDER — PIPERACILLIN-TAZOBACTAM 3.375 G IVPB
3.3750 g | Freq: Two times a day (BID) | INTRAVENOUS | Status: DC
  Administered 2019-07-02 – 2019-07-06 (×8): 3.375 g via INTRAVENOUS
  Filled 2019-07-02 (×8): qty 50

## 2019-07-02 MED ORDER — ATORVASTATIN CALCIUM 20 MG PO TABS
80.0000 mg | ORAL_TABLET | Freq: Every day | ORAL | Status: DC
  Administered 2019-07-02 – 2019-07-05 (×4): 80 mg via ORAL
  Filled 2019-07-02 (×4): qty 4

## 2019-07-02 MED ORDER — ACETAMINOPHEN 325 MG PO TABS
650.0000 mg | ORAL_TABLET | Freq: Four times a day (QID) | ORAL | Status: DC | PRN
  Administered 2019-07-03 – 2019-07-04 (×2): 650 mg via ORAL
  Filled 2019-07-02 (×2): qty 2

## 2019-07-02 MED ORDER — CARVEDILOL 25 MG PO TABS
25.0000 mg | ORAL_TABLET | Freq: Two times a day (BID) | ORAL | Status: DC
  Administered 2019-07-03 – 2019-07-06 (×5): 25 mg via ORAL
  Filled 2019-07-02 (×6): qty 1

## 2019-07-02 MED ORDER — IPRATROPIUM-ALBUTEROL 0.5-2.5 (3) MG/3ML IN SOLN: 3.0000 mL | Freq: Four times a day (QID) | RESPIRATORY_TRACT | Status: DC | PRN

## 2019-07-02 MED ORDER — RENA-VITE PO TABS
1.0000 | ORAL_TABLET | Freq: Every day | ORAL | Status: DC
  Administered 2019-07-03 – 2019-07-06 (×4): 1 via ORAL
  Filled 2019-07-02 (×4): qty 1

## 2019-07-02 MED ORDER — CHLORHEXIDINE GLUCONATE CLOTH 2 % EX PADS
6.0000 | MEDICATED_PAD | Freq: Every day | CUTANEOUS | Status: DC
  Administered 2019-07-05 – 2019-07-06 (×2): 6 via TOPICAL
  Filled 2019-07-02: qty 6

## 2019-07-02 MED ORDER — APIXABAN 2.5 MG PO TABS
2.5000 mg | ORAL_TABLET | Freq: Two times a day (BID) | ORAL | Status: DC
  Administered 2019-07-02 – 2019-07-06 (×8): 2.5 mg via ORAL
  Filled 2019-07-02 (×8): qty 1

## 2019-07-02 MED ORDER — HYDROXYZINE HCL 25 MG PO TABS: 25.0000 mg | ORAL_TABLET | Freq: Three times a day (TID) | ORAL | Status: DC | PRN

## 2019-07-02 MED ORDER — PIPERACILLIN-TAZOBACTAM 3.375 G IVPB 30 MIN
3.3750 g | Freq: Once | INTRAVENOUS | Status: AC
  Administered 2019-07-02: 3.375 g via INTRAVENOUS
  Filled 2019-07-02: qty 50

## 2019-07-02 MED ORDER — SEVELAMER CARBONATE 800 MG PO TABS
1600.0000 mg | ORAL_TABLET | Freq: Three times a day (TID) | ORAL | Status: DC
  Administered 2019-07-03 – 2019-07-05 (×5): 1600 mg via ORAL
  Filled 2019-07-02 (×7): qty 2

## 2019-07-02 MED ORDER — VANCOMYCIN HCL 500 MG IV SOLR
500.0000 mg | INTRAVENOUS | Status: DC
  Administered 2019-07-02: 22:00:00 500 mg via INTRAVENOUS
  Filled 2019-07-02: qty 500

## 2019-07-02 MED ORDER — LORATADINE 10 MG PO TABS
10.0000 mg | ORAL_TABLET | Freq: Every day | ORAL | Status: DC
  Administered 2019-07-03 – 2019-07-06 (×4): 10 mg via ORAL
  Filled 2019-07-02 (×4): qty 1

## 2019-07-02 NOTE — Progress Notes (Signed)
Pharmacy Antibiotic Note  Brett Small is a 43 y.o. male admitted on 07/02/2019 with possible pneumonia.  Pharmacy has been consulted for vancomycin and Zosyn dosing.  Patient with ESRD on HD TTS.  Plan: Patient presented from dialysis center but was not dialyzed today. Plan is for HD today.  Vancomycin 500 mg IV TTS to be given after dialysis  Zosyn 3.375 g IV q12h extended infusion  Height: 5\' 1"  (154.9 cm) Weight: 100 lb (45.4 kg) IBW/kg (Calculated) : 52.3  Temp (24hrs), Avg:98.6 F (37 C), Min:98.6 F (37 C), Max:98.6 F (37 C)  Recent Labs  Lab 07/02/19 1241 07/02/19 1403  WBC 12.5*  --   CREATININE 5.11*  --   LATICACIDVEN  --  3.2*    Estimated Creatinine Clearance: 11.7 mL/min (A) (by C-G formula based on SCr of 5.11 mg/dL (H)).    Allergies  Allergen Reactions  . Codeine Nausea Only    Patient questioned this (??)  . Sulfa Antibiotics Hives and Nausea And Vomiting    Antimicrobials this admission: Vancomycin 9/10 >> Zosyn 9/10 >>  Dose adjustments this admission: NA  Microbiology results: 9/10 BCx: pending 9/10 MRSA PCR: pending 9/10 SARS Coronavirus 2: negative  Thank you for allowing pharmacy to be a part of this patient's care.  Tawnya Crook, PharmD 07/02/2019 4:12 PM

## 2019-07-02 NOTE — Progress Notes (Signed)
HD Initiated:     07/02/19 1704  Vital Signs  Pulse Rate (!) 114  Resp 18  BP (!) 153/119  Oxygen Therapy  SpO2 93 %  O2 Device Room Air  During Hemodialysis Assessment  Blood Flow Rate (mL/min) 400 mL/min  Arterial Pressure (mmHg) 240 mmHg  Venous Pressure (mmHg) 110 mmHg  Transmembrane Pressure (mmHg) 60 mmHg  Ultrafiltration Rate (mL/min) 510 mL/min  Dialysate Flow Rate (mL/min) 600 ml/min  Conductivity: Machine  13.9  HD Safety Checks Performed Yes  Intra-Hemodialysis Comments Tx initiated

## 2019-07-02 NOTE — Progress Notes (Signed)
Post HD Tx: .    07/02/19 2010  Vital Signs  Temp 98.6 F (37 C)  Temp Source Oral  Pulse Rate (!) 110  Pulse Rate Source Dinamap  Resp (!) 26  BP (!) 158/110  BP Location Right Arm  BP Method Automatic  Patient Position (if appropriate) Lying  Oxygen Therapy  SpO2 100 %  O2 Device Nasal Cannula  O2 Flow Rate (L/min) 2 L/min  Pain Assessment  Pain Scale 0-10  Pain Score 0  Dialysis Weight  Weight  (unable to measure on stretcher)  Type of Weight Post-Dialysis  Post-Hemodialysis Assessment  Rinseback Volume (mL) 250 mL  KECN 53.2 V  Dialyzer Clearance Lightly streaked  Duration of HD Treatment -hour(s) 3 hour(s)  Hemodialysis Intake (mL) 500 mL  UF Total -Machine (mL) 1501 mL  Net UF (mL) 1001 mL  Tolerated HD Treatment Yes  AVG/AVF Arterial Site Held (minutes)  (n/a)  AVG/AVF Venous Site Held (minutes)  (n/a)  Education / Care Plan  Dialysis Education Provided Yes  Documented Education in Care Plan Yes  Outpatient Plan of Care Reviewed and on Chart Yes

## 2019-07-02 NOTE — ED Triage Notes (Signed)
Pt here via Ems from [facility], pt did not have treatment today. Per Ems, pt complains of pinpoint chest pain that started yesterday and has consistently been around 7-8/ 10. Pt complains of increased SHOB, N/V that started at midnight. Last time pt vomited was 10am. Ems VSS- B/P 174/106.

## 2019-07-02 NOTE — ED Notes (Signed)
Report called to [place]. Explained that pt is currently in dialysis and will likely not be back before shift change at 7pm. Per Misty, report will be handed off to oncoming floor nurse.

## 2019-07-02 NOTE — Progress Notes (Signed)
HD Tx Completed:     07/02/19 2002  Vital Signs  Temp 98.6 F (37 C)  Temp Source Oral  Pulse Rate (!) 108  Resp 20  BP (!) 161/121  Oxygen Therapy  SpO2 100 %  O2 Device Nasal Cannula  O2 Flow Rate (L/min) 2 L/min  Pain Assessment  Pain Scale 0-10  Pain Score 0  During Hemodialysis Assessment  Blood Flow Rate (mL/min) 300 mL/min  Arterial Pressure (mmHg) -180 mmHg  Venous Pressure (mmHg) 100 mmHg  Transmembrane Pressure (mmHg) 60 mmHg  Ultrafiltration Rate (mL/min) 510 mL/min  Dialysate Flow Rate (mL/min) 60 ml/min  Conductivity: Machine  13.9  HD Safety Checks Performed Yes  Intra-Hemodialysis Comments Tx completed

## 2019-07-02 NOTE — Progress Notes (Signed)
Pre HD Tx:     07/02/19 1650  Vital Signs  Pulse Rate (!) 116  Resp (!) 22  BP (!) 153/109  Oxygen Therapy  SpO2 95 %  O2 Device Room Air  Pain Assessment  Pain Scale 0-10  Pain Score 0  Dialysis Weight  Weight  (unable to measure on ED stretcher)  Type of Weight Pre-Dialysis  Time-Out for Hemodialysis  What Procedure? HD  Pt Identifiers(min of two) First/Last Name;MRN/Account#;Pt's DOB(use if MRN/Acct# not available  Correct Site? Yes  Correct Side? Yes  Correct Procedure? Yes  Consents Verified? Yes  Rad Studies Available? N/A  Safety Precautions Reviewed? Yes  Engineer, civil (consulting) Number 3  Station Number 3  UF/Alarm Test Passed  Conductivity: Meter 13.6  Conductivity: Machine  13.9  pH 7.4  Reverse Osmosis main  Normal Saline Lot Number [id number]  Dialyzer Lot Number [id number]  Disposable Set Lot Number [id number]  Machine Temperature 98.6 F (37 C)  Musician and Audible Yes  Blood Lines Intact and Secured Yes  Pre Treatment Patient Checks  Vascular access used during treatment Catheter  HD catheter dressing before treatment WDL  Patient is receiving dialysis in a chair  (no)  Hepatitis B Surface Antigen Results Negative  Date Hepatitis B Surface Antigen Drawn 05/19/19  Isolation Initiated  (none)  Hepatitis B Surface Antibody  (negative)  Date Hepatitis B Surface Antibody Drawn 05/19/19  Hemodialysis Consent Verified Yes  Hemodialysis Standing Orders Initiated Yes  ECG (Telemetry) Monitor On Yes  Prime Ordered Normal Saline  Length of  DialysisTreatment -hour(s) 3 Hour(s)  Dialysis Treatment Comments  (Na 140)  Dialyzer Elisio 17H NR  Dialysate 3K;2.5 Ca  Dialysate Flow Ordered 600  Blood Flow Rate Ordered 400 mL/min  Ultrafiltration Goal 1 Liters  Dialysis Blood Pressure Support Ordered Normal Saline  Education / Care Plan  Dialysis Education Provided Yes  Documented Education in Care Plan Yes  Outpatient Plan of Care Reviewed and on  Chart Yes

## 2019-07-02 NOTE — Progress Notes (Signed)
[facility]  ROUNDING NOTE   Subjective:   Mr. Lyden Deasy admitted to [facility] on 07/02/2019 for chest pain  Did not get hemodialysis treatment today. Last treatment was Tuesday. Left at 45kg. Today below that. Patient reports nausea, vomiting and unable to take PO food or medications.   Placed on dialysis. Seen and examined on treatment. Using RIJ permcath.   Objective:  Vital signs in last 24 hours:  Temp:  [98.6 F (37 C)] 98.6 F (37 C) (09/10 1240) Pulse Rate:  [113-114] 114 (09/10 1400) Resp:  [13-20] 13 (09/10 1400) BP: (156-157)/(116-125) 157/122 (09/10 1400) SpO2:  [97 %-98 %] 98 % (09/10 1400) Weight:  [45.4 kg] 45.4 kg (09/10 1233)  Weight change:  Filed Weights   07/02/19 1233  Weight: 45.4 kg    Intake/Output: No intake/output data recorded.   Intake/Output this shift:  No intake/output data recorded.  Physical Exam: General: NAD, laying in stretcher  Head: Normocephalic, atraumatic. Moist oral mucosal membranes  Eyes: Anicteric, PERRL  Neck: Supple, trachea midline  Lungs:  Clear to auscultation  Heart: Regular rate and rhythm  Abdomen:  Soft, nontender,   Extremities:  no peripheral edema.  Neurologic: Nonfocal, moving all four extremities  Skin: No lesions  Access: Left AVG, RIJ permcath    Basic Metabolic Panel: Recent Labs  Lab 07/02/19 1241  NA 136  K 3.7  CL 100  CO2 17*  GLUCOSE 130*  BUN 31*  CREATININE 5.11*  CALCIUM 9.4    Liver Function Tests: Recent Labs  Lab 07/02/19 1241  AST 19  ALT 14  ALKPHOS 96  BILITOT 3.9*  PROT 6.7  ALBUMIN 3.1*   Recent Labs  Lab 07/02/19 1241  LIPASE 26   No results for input(s): AMMONIA in the last 168 hours.  CBC: Recent Labs  Lab 07/02/19 1241  WBC 12.5*  HGB 11.1*  HCT 34.9*  MCV 105.8*  PLT 123*    Cardiac Enzymes: No results for input(s): CKTOTAL, CKMB, CKMBINDEX, TROPONINI in the last 168 hours.  BNP: Invalid input(s): POCBNP  CBG: No results for  input(s): GLUCAP in the last 168 hours.  Microbiology: Results for orders placed or performed during the hospital encounter of 07/02/19  SARS Coronavirus 2 [facility] order, Performed in [facility] hospital lab) Nasopharyngeal Nasopharyngeal Swab     Status: None   Collection Time: 07/02/19  2:03 PM   Specimen: Nasopharyngeal Swab  Result Value Ref Range Status   SARS Coronavirus 2 NEGATIVE NEGATIVE Final    Comment: (NOTE) If result is NEGATIVE SARS-CoV-2 target nucleic acids are NOT DETECTED. The SARS-CoV-2 RNA is generally detectable in upper and lower  respiratory specimens during the acute phase of infection. The lowest  concentration of SARS-CoV-2 viral copies this assay can detect is 250  copies / mL. A negative result does not preclude SARS-CoV-2 infection  and should not be used as the sole basis for treatment or other  patient management decisions.  A negative result may occur with  improper specimen collection / handling, submission of specimen other  than nasopharyngeal swab, presence of viral mutation(s) within the  areas targeted by this assay, and inadequate number of viral copies  (<250 copies / mL). A negative result must be combined with clinical  observations, patient history, and epidemiological information. If result is POSITIVE SARS-CoV-2 target nucleic acids are DETECTED. The SARS-CoV-2 RNA is generally detectable in upper and lower  respiratory specimens dur ing the acute phase of infection.  Positive  results are indicative of active infection with SARS-CoV-2.  Clinical  correlation with patient history and other diagnostic information is  necessary to determine patient infection status.  Positive results do  not rule out bacterial infection or co-infection with other viruses. If result is PRESUMPTIVE POSTIVE SARS-CoV-2 nucleic acids MAY BE PRESENT.   A presumptive positive result was obtained on the submitted specimen  and confirmed on repeat testing.   While 2019 novel coronavirus  (SARS-CoV-2) nucleic acids may be present in the submitted sample  additional confirmatory testing may be necessary for epidemiological  and / or clinical management purposes  to differentiate between  SARS-CoV-2 and other Sarbecovirus currently known to infect humans.  If clinically indicated additional testing with an alternate test  methodology [phone]) is advised. The SARS-CoV-2 RNA is generally  detectable in upper and lower respiratory sp ecimens during the acute  phase of infection. The expected result is Negative. Fact Sheet for Patients:  StrictlyIdeas.no Fact Sheet for Healthcare Providers: BankingDealers.co.za This test is not yet approved or cleared by the [place] FDA and has been authorized for detection and/or diagnosis of SARS-CoV-2 by FDA under an Emergency Use Authorization (EUA).  This EUA will remain in effect (meaning this test can be used) for the duration of the COVID-19 declaration under Section 564(b)(1) of the Act, 21 U.S.C. section 360bbb-3(b)(1), unless the authorization is terminated or revoked sooner. Performed at [facility], [address] [place], [place] [address]     Coagulation Studies: Recent Labs    07/02/19 1241  LABPROT 17.7*  INR 1.5*    Urinalysis: No results for input(s): COLORURINE, LABSPEC, PHURINE, GLUCOSEU, HGBUR, BILIRUBINUR, KETONESUR, PROTEINUR, UROBILINOGEN, NITRITE, LEUKOCYTESUR in the last 72 hours.  Invalid input(s): APPERANCEUR    Imaging: Dg Chest Port 1 View  Result Date: 07/02/2019 CLINICAL DATA:  Chest pain and increased shortness of breath prior to scheduled dialysis. EXAM: PORTABLE CHEST 1 VIEW COMPARISON:  03/02/2019 FINDINGS: Interval right jugular double-lumen catheter in satisfactory position. No pneumothorax. Enlarged cardiac silhouette with interval increase in size. Minimally prominent vasculature without  significant change. Interval mild patchy opacity at the left lung base and possible minimal left pleural effusion. The previously demonstrated left axillary stent is again stroke demonstrated with an additional stent more inferiorly in the upper arm. Unremarkable bones. IMPRESSION: 1. Interval mild patchy atelectasis or pneumonia at the left lung base and possible minimal left pleural effusion. 2. Progressive cardiomegaly with stable minimal remain minimal pulmonary vascular congestion. Electronically Signed   By: Claudie Revering M.D.   On: 07/02/2019 13:13     Medications:    . [START ON 07/03/2019] Chlorhexidine Gluconate Cloth  6 each Topical Q0600   ipratropium-albuterol  Assessment/ Plan:  Mr. Clifford Westmoreland is a 43 y.o. white male with end stage renal disease on hemodialysis, hypertension, peripheral vascular disease, depression who is admitted to [facility] on 07/02/2019 for chest pain  [place]. TTS 46.5 kg Left AVG  1. End Stage Renal Disease: Seen and examined on hemodialysis treatment. UF goal of 1 liter.   2. Hypertension: holding blood pressure medications. Home regimen of amlodipine, carvedilol, and hydralazine. Unable to tolerate PO meds due to nausea/vomiting.   3. Anemia of chronic kidney disease:   - Holding EPO  4. Secondary Hyperparathyroidism:   - sevelamer with meals    LOS: 0 Samhitha Rosen 9/10/20203:46 PM

## 2019-07-02 NOTE — Progress Notes (Signed)
Post HD Assessment:     07/02/19 2010  Neurological  Level of Consciousness Alert  Orientation Level Oriented X4  Respiratory  Respiratory Pattern Regular  Chest Assessment Chest expansion symmetrical  Bilateral Breath Sounds Diminished  Cardiac  Pulse Regular  Heart Sounds S1, S2  Jugular Venous Distention (JVD) No  ECG Monitor Yes  Cardiac Rhythm ST  Vascular  R Radial Pulse +2  L Radial Pulse +2  Psychosocial  Psychosocial (WDL) X  Patient Behaviors Flat affect  Emotional support given Given to patient

## 2019-07-02 NOTE — ED Notes (Signed)
Verified that dialysis is ready for pt. EDT [facility] to transport.

## 2019-07-02 NOTE — H&P (Signed)
Brett Small at [place] NAME: Brett Small    MR#:  [id number]  DATE OF BIRTH:  1976/04/13  DATE OF ADMISSION:  07/02/2019  PRIMARY CARE PHYSICIAN: Patient, [name]   REQUESTING/REFERRING PHYSICIAN: Dr. Lenise Arena.   CHIEF COMPLAINT:   Chief Complaint  Patient presents with  . Chest Pain    HISTORY OF PRESENT ILLNESS:  Brett Small  is a 43 y.o. male with a known history of end-stage renal disease on hemodialysis, hypertension, secondary hyperparathyroidism, neuropathy who presents to the hospital due to chest pain.  Patient says he was in his usual state of health and reported dialysis today and developed some chest pain prior to dialysis.  Patient developed the symptoms prior to dialysis and had associated shortness of breath nausea and vomiting with it.  He was concerned and therefore was brought to the ER for further evaluation.  Emergency room patient was noted to be slightly hypertensive with blood pressure greater than 170.  Patient was noted to have a mild leukocytosis and was noted to be mildly hypoxic and underwent a chest x-ray which showed suspected pneumonia.  Hospitalist services were contacted for admission.  Patient denies any documented fever, chills, sick contacts, recent travel history.  Patient's COVID-19 test is still pending.  PAST MEDICAL HISTORY:   Past Medical History:  Diagnosis Date  . ESRD (end stage renal disease) ([place])   . Hypertension   . Renal disorder   . Secondary hyperparathyroidism of renal origin [facility])     PAST SURGICAL HISTORY:   Past Surgical History:  Procedure Laterality Date  . A/V SHUNT INTERVENTION Left 01/19/2019   Procedure: LEFT UPPER EXTREMITY A/V SHUNTOGRAM / UPPER EXTREMITY ANGIOGRAM;  Surgeon: Algernon Huxley, MD;  Location: [place] [facility];  Service: Cardiovascular;  Laterality: Left;  . A/V SHUNT INTERVENTION Left 03/02/2019   Procedure: A/V SHUNT INTERVENTION;  Surgeon:  Algernon Huxley, MD;  Location: [place] [facility];  Service: Cardiovascular;  Laterality: Left;  . AORTA - FEMORAL ARTERY BYPASS GRAFT    . AV FISTULA PLACEMENT Left 12/26/2018   Procedure: INSERTION OF GORE STRETCH VASCULAR 4-7MM X  45CM IN LEFT UPPER ARM;  Surgeon: Marty Heck, MD;  Location: [place];  Service: Vascular;  Laterality: Left;  . DIALYSIS/PERMA CATHETER REMOVAL N/A 02/06/2019   Procedure: DIALYSIS/PERMA CATHETER REMOVAL;  Surgeon: Algernon Huxley, MD;  Location: [place] [facility];  Service: Cardiovascular;  Laterality: N/A;    SOCIAL HISTORY:   Social History   Tobacco Use  . Smoking status: Current Every Day Smoker    Packs/day: 0.50    Years: 30.00    Pack years: 15.00    Types: Cigarettes    Last attempt to quit: 06/26/2018    Years since quitting: 1.0  . Smokeless tobacco: Never Used  . Tobacco comment: smoked for 30 years   Substance Use Topics  . Alcohol use: Not Currently    FAMILY HISTORY:   Family History  Problem Relation Age of Onset  . Hypertension Other   . Diabetes Other   . Clotting disorder Father     DRUG ALLERGIES:   Allergies  Allergen Reactions  . Codeine Nausea Only    Patient questioned this (??)  . Sulfa Antibiotics Hives and Nausea And Vomiting    REVIEW OF SYSTEMS:   Review of Systems  Constitutional: Negative for fever and weight loss.  HENT: Negative for congestion, nosebleeds and  tinnitus.   Eyes: Negative for blurred vision, double vision and redness.  Respiratory: Positive for shortness of breath. Negative for cough and hemoptysis.   Cardiovascular: Positive for chest pain. Negative for orthopnea, leg swelling and PND.  Gastrointestinal: Positive for nausea and vomiting. Negative for abdominal pain, diarrhea and melena.  Genitourinary: Negative for dysuria, hematuria and urgency.  Musculoskeletal: Negative for falls and joint pain.  Neurological: Negative for dizziness, tingling, sensory change, focal weakness,  seizures, weakness and headaches.  Endo/Heme/Allergies: Negative for polydipsia. Does not bruise/bleed easily.  Psychiatric/Behavioral: Negative for depression and memory loss. The patient is not nervous/anxious.     MEDICATIONS AT HOME:   Prior to Admission medications   Medication Sig Start Date End Date Taking? Authorizing Provider  amLODipine (NORVASC) 10 MG tablet Take 1 tablet (10 mg total) by mouth daily at 8 pm. 02/24/19  Yes Gladstone Lighter, MD  apixaban (ELIQUIS) 2.5 MG TABS tablet Take 1 tablet (2.5 mg total) by mouth 2 (two) times daily. 01/26/19  Yes Clapacs, Madie Reno, MD  aspirin EC 81 MG tablet Take 1 tablet (81 mg total) by mouth daily. 01/26/19  Yes Clapacs, Madie Reno, MD  atorvastatin (LIPITOR) 80 MG tablet Take 1 tablet (80 mg total) by mouth at bedtime. 01/26/19  Yes Clapacs, Madie Reno, MD  b complex-C-folic acid 1 MG capsule Take 1 capsule by mouth. With supper. 09/18/18  Yes [provider]  carvedilol (COREG) 25 MG tablet Take 1 tablet (25 mg total) by mouth 2 (two) times daily with a meal. 01/26/19  Yes Clapacs, Madie Reno, MD  gabapentin (NEURONTIN) 100 MG capsule Take 1 capsule (100 mg total) by mouth 3 (three) times daily. 01/26/19  Yes Clapacs, Madie Reno, MD  hydrALAZINE (APRESOLINE) 25 MG tablet Take 1 tablet (25 mg total) by mouth every 8 (eight) hours. 01/26/19  Yes Clapacs, Madie Reno, MD  hydrOXYzine (ATARAX/VISTARIL) 25 MG tablet Take 25 mg by mouth 3 (three) times daily as needed for anxiety.   Yes [provider]  loratadine (CLARITIN) 10 MG tablet Take 1 tablet (10 mg total) by mouth daily. 03/04/19  Yes Salary, Avel Peace, MD  multivitamin (RENA-VIT) TABS tablet Take 1 tablet by mouth daily. 01/27/19  Yes Clapacs, Madie Reno, MD  neomycin-bacitracin-polymyxin (NEOSPORIN) OINT Apply 1 application topically 2 (two) times daily. 01/26/19  Yes Clapacs, Madie Reno, MD  sevelamer carbonate (RENVELA) 800 MG tablet Take 2 tablets (1,600 mg total) by mouth 3 (three) times daily with meals.  01/26/19  Yes Clapacs, Madie Reno, MD  traMADol (ULTRAM) 50 MG tablet Take 1 tablet (50 mg total) by mouth every 12 (twelve) hours as needed for moderate pain or severe pain. 03/03/19  Yes Salary, Montell D, MD      VITAL SIGNS:  Blood pressure (!) 157/122, pulse (!) 114, temperature 98.6 F (37 C), temperature source Oral, resp. rate 13, height 5\' 1"  (1.549 m), weight 45.4 kg, SpO2 98 %.  PHYSICAL EXAMINATION:  Physical Exam  GENERAL:  43 y.o.-year-old patient lying in the bed in no acute distress.  EYES: Pupils equal, round, reactive to light and accommodation. No scleral icterus. Extraocular muscles intact.  HEENT: Head atraumatic, normocephalic. Oropharynx and nasopharynx clear. No oropharyngeal erythema, moist oral mucosa  NECK:  Supple, no jugular venous distention. No thyroid enlargement, no tenderness.  LUNGS: Good air entry bilaterally, diffuse rhonchi bilaterally, no rales, wheezing.  Negative use accessory muscles. CARDIOVASCULAR: S1, S2 RRR. No murmurs, rubs, gallops, clicks.  ABDOMEN: Soft, nontender, nondistended.  Bowel sounds present. No organomegaly or mass.  EXTREMITIES: No pedal edema, cyanosis, or clubbing. + 2 pedal & radial pulses b/l.   NEUROLOGIC: Cranial nerves II through XII are intact. No focal Motor or sensory deficits appreciated b/l PSYCHIATRIC: The patient is alert and oriented x 3. Good affect.  SKIN: No obvious rash, lesion, or ulcer.   Right chest wall PermCath in place.  LABORATORY PANEL:   CBC Recent Labs  Lab 07/02/19 1241  WBC 12.5*  HGB 11.1*  HCT 34.9*  PLT 123*   ------------------------------------------------------------------------------------------------------------------  Chemistries  Recent Labs  Lab 07/02/19 1241  NA 136  K 3.7  CL 100  CO2 17*  GLUCOSE 130*  BUN 31*  CREATININE 5.11*  CALCIUM 9.4  AST 19  ALT 14  ALKPHOS 96  BILITOT 3.9*    ------------------------------------------------------------------------------------------------------------------  Cardiac Enzymes No results for input(s): TROPONINI in the last 168 hours. ------------------------------------------------------------------------------------------------------------------  RADIOLOGY:  Dg Chest Port 1 View  Result Date: 07/02/2019 CLINICAL DATA:  Chest pain and increased shortness of breath prior to scheduled dialysis. EXAM: PORTABLE CHEST 1 VIEW COMPARISON:  03/02/2019 FINDINGS: Interval right jugular double-lumen catheter in satisfactory position. No pneumothorax. Enlarged cardiac silhouette with interval increase in size. Minimally prominent vasculature without significant change. Interval mild patchy opacity at the left lung base and possible minimal left pleural effusion. The previously demonstrated left axillary stent is again stroke demonstrated with an additional stent more inferiorly in the upper arm. Unremarkable bones. IMPRESSION: 1. Interval mild patchy atelectasis or pneumonia at the left lung base and possible minimal left pleural effusion. 2. Progressive cardiomegaly with stable minimal remain minimal pulmonary vascular congestion. Electronically Signed   By: Claudie Revering M.D.   On: 07/02/2019 13:13     IMPRESSION AND PLAN:   43 y.o. male with a known history of end-stage renal disease on hemodialysis, hypertension, secondary hyperparathyroidism, neuropathy who presents to the hospital due to chest pain.   1.  Acute respiratory failure with hypoxia-secondary to suspected pneumonia seen on chest x-ray. - Patient's O2 sats were noted to be in the mid 80s on admission.  We will treat the patient with O2 supplementation IV antibiotics for pneumonia with vancomycin, Zosyn.  Follow clinically.  2.  Pneumonia-source of patient's suspected hypoxemia and chest pain. -We will treat the patient with IV vancomycin, Zosyn.  Follow cultures. -We will get MRSA  PCR to narrow antibiotics.  3.  End-stage renal disease on hemodialysis-patient get dialysis on Tuesday Thursday Saturday. -Nephrology has been consulted and plan for dialysis later today.  4.  Essential hypertension-continue Norvasc, hydralazine, carvedilol.  5.  Hyperlipidemia-continue atorvastatin.  6.  Neuropathy-continue gabapentin.  7.  Secondary hyperparathyroidism-continue Renvela.  All the records are reviewed and case discussed with ED provider. Management plans discussed with the patient, family and they are in agreement.  CODE STATUS: Full code  TOTAL TIME TAKING CARE OF THIS PATIENT: 45 minutes.    Henreitta Leber M.D on 07/02/2019 at 2:56 PM  Between 7am to 6pm - Pager - [phone]  After 6pm go to [email or web address] - password [email or web address] [place] [facility]  Office  [phone]  CC: Primary care physician; Patient, [name]

## 2019-07-02 NOTE — ED Notes (Signed)
Date and time results received: 07/02/19 1444  Test: lactic acid Critical Value: 3.2  Name of Provider Notified: Dr. Jimmye Norman  Orders Received? Or Actions Taken?: no new orders at this time

## 2019-07-02 NOTE — ED Provider Notes (Signed)
[facility] Emergency Department Provider Note       Time seen: ----------------------------------------- 12:48 PM on 07/02/2019 -----------------------------------------   I have reviewed the triage vital signs and the nursing notes.  HISTORY   Chief Complaint Chest Pain    HPI Brett Small is a 43 y.o. male with a history of hypertension, thrombocytopenia, coagulopathy, sepsis, lobar pneumonia, depression, end-stage renal disease on dialysis who presents to the ED for chest pain this morning.  Patient states he had chest pain that started yesterday and has been consistently severe.  He has had increased shortness of breath and nausea vomiting that started at midnight.  Pain seems to radiate around the left side of his chest and into his shoulder.  Nothing makes it better or worse.  Past Medical History:  Diagnosis Date  . Hypertension   . Renal disorder     Patient Active Problem List   Diagnosis Date Noted  . Acute liver failure 05/19/2019  . Hematemesis 05/19/2019  . Hepatitis   . Thrombocytopenia ([place])   . Coagulopathy ([place])   . Sepsis ([place]) 02/28/2019  . Lobar pneumonia ([place]) 02/28/2019  . Acute respiratory failure ([place]) 02/04/2019  . Acute respiratory failure with hypoxemia ([place]) 01/27/2019  . Depression 01/07/2019  . MDD (major depressive disorder), single episode, severe , no psychosis ([place])   . Homelessness   . Acute respiratory failure with hypoxia ([place]) 12/25/2018  . End stage renal disease on dialysis ([place]) 12/25/2018  . Hypertension 12/25/2018  . Renal osteodystrophy 12/25/2018    Past Surgical History:  Procedure Laterality Date  . A/V SHUNT INTERVENTION Left 01/19/2019   Procedure: LEFT UPPER EXTREMITY A/V SHUNTOGRAM / UPPER EXTREMITY ANGIOGRAM;  Surgeon: Algernon Huxley, MD;  Location: [facility];  Service: Cardiovascular;  Laterality: Left;  . A/V SHUNT INTERVENTION Left 03/02/2019   Procedure: A/V SHUNT  INTERVENTION;  Surgeon: Algernon Huxley, MD;  Location: [facility];  Service: Cardiovascular;  Laterality: Left;  . AORTA - FEMORAL ARTERY BYPASS GRAFT    . AV FISTULA PLACEMENT Left 12/26/2018   Procedure: INSERTION OF GORE STRETCH VASCULAR 4-7MM X  45CM IN LEFT UPPER ARM;  Surgeon: Marty Heck, MD;  Location: [facility];  Service: Vascular;  Laterality: Left;  . DIALYSIS/PERMA CATHETER REMOVAL N/A 02/06/2019   Procedure: DIALYSIS/PERMA CATHETER REMOVAL;  Surgeon: Algernon Huxley, MD;  Location: [facility];  Service: Cardiovascular;  Laterality: N/A;    Allergies Codeine and Sulfa antibiotics  Social History Social History   Tobacco Use  . Smoking status: Current Every Day Smoker    Packs/day: 0.50    Types: Cigarettes    Last attempt to quit: 06/26/2018    Years since quitting: 1.0  . Smokeless tobacco: Never Used  . Tobacco comment: smoked for 30 years   Substance Use Topics  . Alcohol use: Not Currently  . Drug use: Yes    Frequency: 1.0 times per week    Types: Marijuana    Comment: Pt states "maybe once or twice a week".    Review of Systems Constitutional: Negative for fever. Cardiovascular: Positive for chest pain Respiratory: Positive for shortness of breath Gastrointestinal: Negative for abdominal pain, positive for nausea vomiting Musculoskeletal: Positive for back pain Skin: Negative for rash. Neurological: Negative for headaches, positive for weakness  All systems negative/normal/unremarkable except as stated in the HPI  ____________________________________________   PHYSICAL EXAM:  VITAL SIGNS: ED Triage Vitals  Enc Vitals Group  BP 07/02/19 1240 (!) 157/125     Pulse Rate 07/02/19 1240 (!) 113     Resp 07/02/19 1240 20     Temp 07/02/19 1240 98.6 F (37 C)     Temp Source 07/02/19 1240 Oral     SpO2 07/02/19 1240 97 %     Weight 07/02/19 1233 100 lb (45.4 kg)     Height 07/02/19 1233 5\' 1"  (1.549 m)     Head Circumference --       Peak Flow --      Pain Score 07/02/19 1232 8     Pain Loc --      Pain Edu? --      Excl. in [place]? --    Constitutional: Alert and oriented.  Chronically ill-appearing, no distress Eyes: Conjunctivae are normal. Normal extraocular movements. ENT      Head: Normocephalic and atraumatic.      Nose: No congestion/rhinnorhea.      Mouth/Throat: Mucous membranes are moist.      Neck: No stridor. Cardiovascular: Normal rate, regular rhythm. No murmurs, rubs, or gallops. Respiratory: Normal respiratory effort without tachypnea nor retractions. Breath sounds are clear and equal bilaterally. No wheezes/rales/rhonchi. Gastrointestinal: Soft and nontender. Normal bowel sounds Musculoskeletal: Nontender with normal range of motion in extremities. No lower extremity tenderness nor edema. Neurologic:  Normal speech and language. No gross focal neurologic deficits are appreciated.  Skin:  Skin is warm, dry and intact. No rash noted. Psychiatric: Flat affect.  Speech and behavior are normal.  ____________________________________________  EKG: Interpreted by me.  Sinus tachycardia with a rate of 113 bpm, low voltage, normal axis, long QT  ____________________________________________  ED COURSE:  As part of my medical decision making, I reviewed the following data within the [facility] History obtained from family if available, nursing notes, old chart and ekg, as well as notes from prior ED visits. Patient presented for chest pain, shortness of breath and vomiting in the setting of end-stage renal disease on dialysis, we will assess with labs and imaging as indicated at this time.   Procedures  Brett Small was evaluated in Emergency Department on 07/02/2019 for the symptoms described in the history of present illness. He was evaluated in the context of the global COVID-19 pandemic, which necessitated consideration that the patient might be at risk for infection with the SARS-CoV-2  virus that causes COVID-19. Institutional protocols and algorithms that pertain to the evaluation of patients at risk for COVID-19 are in a state of rapid change based on information released by regulatory bodies including the CDC and federal and state organizations. These policies and algorithms were followed during the patient's care in the ED.  ____________________________________________   LABS (pertinent positives/negatives)  Labs Reviewed  BASIC METABOLIC PANEL - Abnormal; Notable for the following components:      Result Value   CO2 17 (*)    Glucose, Bld 130 (*)    BUN 31 (*)    Creatinine, Ser 5.11 (*)    GFR calc non Af Amer 13 (*)    GFR calc Af Amer 15 (*)    Anion gap 19 (*)    All other components within normal limits  CBC - Abnormal; Notable for the following components:   WBC 12.5 (*)    RBC 3.30 (*)    Hemoglobin 11.1 (*)    HCT 34.9 (*)    MCV 105.8 (*)    RDW 17.4 (*)    Platelets 123 (*)  All other components within normal limits  PROTIME-INR - Abnormal; Notable for the following components:   Prothrombin Time 17.7 (*)    INR 1.5 (*)    All other components within normal limits  TROPONIN I (HIGH SENSITIVITY) - Abnormal; Notable for the following components:   Troponin I (High Sensitivity) 39 (*)    All other components within normal limits  SARS CORONAVIRUS 2 (HOSPITAL ORDER, [facility])  CULTURE, BLOOD (ROUTINE X 2)  CULTURE, BLOOD (ROUTINE X 2)  LIPASE, BLOOD  LACTIC ACID, PLASMA    RADIOLOGY Images were viewed by me  Chest x-ray IMPRESSION: 1. Interval mild patchy atelectasis or pneumonia at the left lung base and possible minimal left pleural effusion. 2. Progressive cardiomegaly with stable minimal remain minimal pulmonary vascular congestion. ____________________________________________   DIFFERENTIAL DIAGNOSIS   CHF, COPD, pneumonia, unstable angina, MI, PE, dissection, ruptured aneurysm, dialysis  FINAL  ASSESSMENT AND PLAN  Chest pain, pneumonia   Plan: The patient had presented for chest pain as well as shortness of breath and vomiting. Patient's labs did reveal a slightly elevated troponin, otherwise were expected given his end-stage renal disease. Patient's imaging revealed left lung base pneumonia and progressive cardiomegaly.  Unclear etiology for his chest pain other than the pneumonia.  He may require further imaging of his chest at some point.  We will start him on broad-spectrum antibiotics and discuss with the hospitalist for admission, he will also need dialysis.   Laurence Aly, MD    Note: This note was generated in part or whole with voice recognition software. Voice recognition is usually quite accurate but there are transcription errors that can and very often do occur. I apologize for any typographical errors that were not detected and corrected.     Earleen Newport, MD 07/02/19 [phone]

## 2019-07-02 NOTE — Progress Notes (Signed)
Pre HD Assessment:     07/02/19 1650  Neurological  Level of Consciousness Alert  Orientation Level Oriented X4  Respiratory  Respiratory Pattern Regular  Chest Assessment Chest expansion symmetrical  Bilateral Breath Sounds Diminished  Cardiac  Pulse Regular  Heart Sounds S1, S2  Jugular Venous Distention (JVD) No  ECG Monitor Yes  Cardiac Rhythm ST  Vascular  R Radial Pulse +2  L Radial Pulse +2  Psychosocial  Psychosocial (WDL) X  Patient Behaviors Flat affect  Emotional support given Given to patient

## 2019-07-03 LAB — BASIC METABOLIC PANEL
Anion gap: 9 (ref 5–15)
BUN: 18 mg/dL (ref 6–20)
CO2: 28 mmol/L (ref 22–32)
Calcium: 8.4 mg/dL — ABNORMAL LOW (ref 8.9–10.3)
Chloride: 101 mmol/L (ref 98–111)
Creatinine, Ser: 3.24 mg/dL — ABNORMAL HIGH (ref 0.61–1.24)
GFR calc Af Amer: 25 mL/min — ABNORMAL LOW (ref >60)
GFR calc non Af Amer: 22 mL/min — ABNORMAL LOW (ref >60)
Glucose, Bld: 156 mg/dL — ABNORMAL HIGH (ref 70–99)
Potassium: 3.2 mmol/L — ABNORMAL LOW (ref 3.5–5.1)
Sodium: 138 mmol/L (ref 135–145)

## 2019-07-03 LAB — CBC
HCT: 30.7 % — ABNORMAL LOW (ref 39.0–52.0)
Hemoglobin: 9.9 g/dL — ABNORMAL LOW (ref 13.0–17.0)
MCH: 33.7 pg (ref 26.0–34.0)
MCHC: 32.2 g/dL (ref 30.0–36.0)
MCV: 104.4 fL — ABNORMAL HIGH (ref 80.0–100.0)
Platelets: 107 10*3/uL — ABNORMAL LOW (ref 150–400)
RBC: 2.94 MIL/uL — ABNORMAL LOW (ref 4.22–5.81)
RDW: 17 % — ABNORMAL HIGH (ref 11.5–15.5)
WBC: 10.4 10*3/uL (ref 4.0–10.5)
nRBC: 0.2 % (ref 0.0–0.2)

## 2019-07-03 MED ORDER — NEPRO/CARBSTEADY PO LIQD: 237.0000 mL | Freq: Two times a day (BID) | ORAL | Status: DC

## 2019-07-03 MED ORDER — POTASSIUM CHLORIDE CRYS ER 20 MEQ PO TBCR: 20.0000 meq | EXTENDED_RELEASE_TABLET | Freq: Once | ORAL | Status: DC

## 2019-07-03 MED ORDER — GUAIFENESIN-DM 100-10 MG/5ML PO SYRP
5.0000 mL | ORAL_SOLUTION | ORAL | Status: DC | PRN
  Administered 2019-07-03 – 2019-07-05 (×3): 5 mL via ORAL
  Filled 2019-07-03 (×4): qty 5

## 2019-07-03 NOTE — Evaluation (Signed)
Physical Therapy Evaluation Patient Details Name: Brett Small MRN: [id number] DOB: 20-Jan-1976 Today's Date: 07/03/2019   History of Present Illness  43 y.o. male with a known history of end-stage renal disease on hemodialysis, hypertension, secondary hyperparathyroidism, neuropathy who presents to the hospital due to chest pain.  Patient says he was in his usual state of health and reported dialysis today and developed some chest pain prior to dialysis.  Patient developed the symptoms prior to dialysis and had associated shortness of breath nausea and vomiting with it.  Was hypertensive and hypoxic on arrival.  Clinical Impression  Pt did well with PT exam circumambulating the nurses' station with walker and generally maintaining consistent and safe cadence.  His O2 was in the high 90s at rest on arrival, attempted light in bed activity on room air but sats quickly dropped to the 80s.  Donned 3L for ambulation his sats stayed in the mid 90s with some fatigue and increased HR (inconsistent reading, but appeared to be 100-110 with 200 ft of ambulation).  Pt has RW and SPC at home which he will or will not use depending on the day.  Appears he has HHPT 1x/wk and continuing of HHPT is appropriate per today's assessment.  Pt with some nausea after ambulation, dry heaving in emesis bag, nursing notified and to bring meds.      Follow Up Recommendations Home health PT    Equipment Recommendations  None recommended by PT    Recommendations for Other Services       Precautions / Restrictions Precautions Precautions: None Restrictions Weight Bearing Restrictions: No      Mobility  Bed Mobility Overal bed mobility: Independent             General bed mobility comments: Pt able to get to EOB w/o hesitation  Transfers Overall transfer level: Independent Equipment used: Rolling walker (2 wheeled)             General transfer comment: heavy use of UEs, but safe and  confident  Ambulation/Gait Ambulation/Gait assistance: Modified independent (Device/Increase time) Gait Distance (Feet): 200 Feet Assistive device: Rolling walker (2 wheeled)       General Gait Details: Pt is able to maintain consistent speed and cadence.  Does have baseline limp on L secondary to ankle ROM/weakness  Stairs            Wheelchair Mobility    Modified Rankin (Stroke Patients Only)       Balance Overall balance assessment: Modified Independent                                           Pertinent Vitals/Pain Pain Assessment: No/denies pain    Home Living Family/patient expects to be discharged to:: Private residence Living Arrangements: Group Home Available Help at Discharge: Friend(s) Type of Home: Group Home Home Access: Stairs to enter Entrance Stairs-Rails: None Entrance Stairs-Number of Steps: 6 Home Layout: Two level Home Equipment: [place] - 2 wheels;Cane - single point      Prior Function Level of Independence: Independent with assistive device(s)         Comments: Pt takes transport to dialysis TTS, is not out of the home much otherwise.     Hand Dominance        Extremity/Trunk Assessment   Upper Extremity Assessment Upper Extremity Assessment: Overall WFL for tasks assessed    Lower  Extremity Assessment Lower Extremity Assessment: Overall WFL for tasks assessed(chronic R ankle decreased DF mobilty/neuropathy)       Communication   Communication: No difficulties  Cognition Arousal/Alertness: Awake/alert Behavior During Therapy: WFL for tasks assessed/performed Overall Cognitive Status: Within Functional Limits for tasks assessed                                        General Comments      Exercises     Assessment/Plan    PT Assessment Patient needs continued PT services  PT Problem List Decreased strength;Decreased range of motion;Decreased activity tolerance;Decreased  balance;Decreased mobility;Decreased knowledge of use of DME;Decreased safety awareness;Cardiopulmonary status limiting activity       PT Treatment Interventions DME instruction;Gait training;Stair training;Functional mobility training;Therapeutic activities;Therapeutic exercise;Balance training;Neuromuscular re-education    PT Goals (Current goals can be found in the Care Plan section)  Acute Rehab PT Goals Patient Stated Goal: get breathing better PT Goal Formulation: With patient Time For Goal Achievement: 07/17/19 Potential to Achieve Goals: Good    Frequency Min 2X/week   Barriers to discharge        Co-evaluation               AM-PAC PT "6 Clicks" Mobility  Outcome Measure Help needed turning from your back to your side while in a flat bed without using bedrails?: None Help needed moving from lying on your back to sitting on the side of a flat bed without using bedrails?: None Help needed moving to and from a bed to a chair (including a wheelchair)?: None Help needed standing up from a chair using your arms (e.g., wheelchair or bedside chair)?: None Help needed to walk in hospital room?: A Little Help needed climbing 3-5 steps with a railing? : A Lot 6 Click Score: 21    End of Session Equipment Utilized During Treatment: Gait belt;Oxygen(2.5-3L) Activity Tolerance: Patient tolerated treatment well;Patient limited by fatigue Patient left: with bed alarm set;with call bell/phone within reach Nurse Communication: Mobility status(need for nausea meds) PT Visit Diagnosis: Muscle weakness (generalized) (M62.81);Difficulty in walking, not elsewhere classified (R26.2)    Time: [id number] PT Time Calculation (min) (ACUTE ONLY): 28 min   Charges:   PT Evaluation $PT Eval Low Complexity: 1 Low PT Treatments $Gait Training: 8-22 mins        Kreg Shropshire, DPT 07/03/2019, 6:00 PM

## 2019-07-03 NOTE — Progress Notes (Signed)
[facility] [place] NAME: Brett Small    MR#:  [id number]  DATE OF BIRTH:  26-Apr-1976  SUBJECTIVE:  CHIEF COMPLAINT:   Chief Complaint  Patient presents with  . Chest Pain  Patient seen and evaluated today Has occasional cough Comfortable on oxygen via [place] Shortness of breath present  REVIEW OF SYSTEMS:    ROS  CONSTITUTIONAL: No documented fever. Has fatigue, weakness. No weight gain, no weight loss.  EYES: No blurry or double vision.  ENT: No tinnitus. No postnasal drip. No redness of the oropharynx.  RESPIRATORY: occasional cough, no wheeze, no hemoptysis. Has dyspnea.  CARDIOVASCULAR: No chest pain. No orthopnea. No palpitations. No syncope.  GASTROINTESTINAL: No nausea, no vomiting or diarrhea. No abdominal pain. No melena or hematochezia.  GENITOURINARY: No dysuria or hematuria.  ENDOCRINE: No polyuria or nocturia. No heat or cold intolerance.  HEMATOLOGY: No anemia. No bruising. No bleeding.  INTEGUMENTARY: No rashes. No lesions.  MUSCULOSKELETAL: No arthritis. No swelling. No gout.  NEUROLOGIC: No numbness, tingling, or ataxia. No seizure-type activity.  PSYCHIATRIC: No anxiety. No insomnia. No ADD.   DRUG ALLERGIES:   Allergies  Allergen Reactions  . Codeine Nausea Only    Patient questioned this (??)  . Sulfa Antibiotics Hives and Nausea And Vomiting    VITALS:  Blood pressure 124/88, pulse 90, temperature 98.4 F (36.9 C), temperature source Oral, resp. rate 16, height 5\' 1"  (1.549 m), weight 45.4 kg, SpO2 96 %.  PHYSICAL EXAMINATION:   Physical Exam  GENERAL:  43 y.o.-year-old patient lying in the bed with no acute distress.  EYES: Pupils equal, round, reactive to light and accommodation. No scleral icterus. Extraocular muscles intact.  HEENT: Head atraumatic, normocephalic. Oropharynx and nasopharynx clear.  NECK:  Supple, no jugular venous distention. No thyroid enlargement, no tenderness.  LUNGS:  Decreased breath sounds bilaterally, rales heard in left lung. No use of accessory muscles of respiration.  CARDIOVASCULAR: S1, S2 normal. No murmurs, rubs, or gallops.  ABDOMEN: Soft, nontender, nondistended. Bowel sounds present. No organomegaly or mass.  EXTREMITIES: No cyanosis, clubbing or edema b/l.    NEUROLOGIC: Cranial nerves II through XII are intact. No focal Motor or sensory deficits b/l.   PSYCHIATRIC: The patient is alert and oriented x 3.  SKIN: No obvious rash, lesion, or ulcer.   LABORATORY PANEL:   CBC Recent Labs  Lab 07/03/19 0634  WBC 10.4  HGB 9.9*  HCT 30.7*  PLT 107*   ------------------------------------------------------------------------------------------------------------------ Chemistries  Recent Labs  Lab 07/02/19 1241 07/03/19 0634  NA 136 138  K 3.7 3.2*  CL 100 101  CO2 17* 28  GLUCOSE 130* 156*  BUN 31* 18  CREATININE 5.11* 3.24*  CALCIUM 9.4 8.4*  AST 19  --   ALT 14  --   ALKPHOS 96  --   BILITOT 3.9*  --    ------------------------------------------------------------------------------------------------------------------  Cardiac Enzymes No results for input(s): TROPONINI in the last 168 hours. ------------------------------------------------------------------------------------------------------------------  RADIOLOGY:  Dg Chest Port 1 View  Result Date: 07/02/2019 CLINICAL DATA:  Chest pain and increased shortness of breath prior to scheduled dialysis. EXAM: PORTABLE CHEST 1 VIEW COMPARISON:  03/02/2019 FINDINGS: Interval right jugular double-lumen catheter in satisfactory position. No pneumothorax. Enlarged cardiac silhouette with interval increase in size. Minimally prominent vasculature without significant change. Interval mild patchy opacity at the left lung base and possible minimal left pleural effusion. The previously demonstrated left axillary stent is again stroke demonstrated with an  additional stent more inferiorly in the  upper arm. Unremarkable bones. IMPRESSION: 1. Interval mild patchy atelectasis or pneumonia at the left lung base and possible minimal left pleural effusion. 2. Progressive cardiomegaly with stable minimal remain minimal pulmonary vascular congestion. Electronically Signed   By: Claudie Revering M.D.   On: 07/02/2019 13:13     ASSESSMENT AND PLAN:   43 y.o. male with a known history of end-stage renal disease on hemodialysis, hypertension, secondary hyperparathyroidism, neuropathy currently under hospitalist service  1.  Acute respiratory failure with hypoxia-secondary to suspected pneumonia seen on chest x-ray. - oxygen via [place] -Continue IV Zosyn abx  2.  Pneumonia-source of patient's suspected hypoxemia and chest pain. -We will treat the patient IV Zosyn.  Follow cultures. -MRSA pcr negative DC vanc  3.  End-stage renal disease on hemodialysis-patient get dialysis on Tuesday Thursday Saturday. -Nephrology has been consulted and plan for dialysis   4.  Essential hypertension-continue Norvasc, hydralazine, carvedilol.  5.  Hyperlipidemia-continue atorvastatin.  6.  Neuropathy-continue gabapentin.  7.  Secondary hyperparathyroidism-continue Renvela.   All the records are reviewed and case discussed with Care Management/Social Worker. Management plans discussed with the patient, family and they are in agreement.  CODE STATUS: Full code  DVT Prophylaxis: SCDs  TOTAL TIME TAKING CARE OF THIS PATIENT: 36 minutes.   POSSIBLE D/C IN 1 to 2 DAYS, DEPENDING ON CLINICAL CONDITION.  Saundra Shelling M.D on 07/03/2019 at 10:33 AM  Between 7am to 6pm - Pager - [phone]  After 6pm go to [email or web address] - password [email or web address] [facility]  Office  [phone]  CC: Primary care physician; Patient, [name]  Note: This dictation was prepared with Dragon dictation along with smaller phrase technology. Any transcriptional errors that result from this process are  unintentional.

## 2019-07-03 NOTE — TOC Initial Note (Signed)
Transition of Care [facility]) - Initial/Assessment Note    Patient Details  Name: Brett Small MRN: [id number] Date of Birth: 07-12-1976  Transition of Care [facility]) CM/SW Contact:    Beverly Sessions, RN Phone Number: 07/03/2019, 2:43 PM  Clinical Narrative:                 Patient admitted from home with PNA  Patient resides at a boarding home. "[employer] Patient states that he pays $460 a month to stay there  Patient goes to HD TTS and utilizes [facility] transportation Patient denies issues with transportation to HD. [name] dialysis liaison notified of admission.     Previous discharge patient was made a new patient appointment scheduled for [facility] for 5/19.  He did not go  Patient agreeable for new patient appointment to be made, states he did not have preference of practice.  New patient appointment made for [place] 07/14/19  Patient in agreement for home health.  Multiple comorbidities, and extreme risk for readmission.  Patient states "if you think it will help keep me out of here Im willing to try it".  Patient states he does not have a preference of home health agency.  Heads up referral made to [facility] with [place]. MD aware of need for orders at discharge  Patient currently requiring acute O2  Patient states that he has a RW and cane at home for ambulation  Expected Discharge Plan: [place] Services Barriers to Discharge: Continued Medical Work up   Patient Goals and CMS Choice   CMS Medicare.gov Compare Post Acute Care list provided to:: Patient Choice offered to / list presented to : Patient  Expected Discharge Plan and Services Expected Discharge Plan: [place] Choice: [place] arrangements for the past 2 months: (Boarding house)                           [place] Arranged: RN [place] Agency: [place] ([place]) Date [place]: 07/03/19 Time [place]: 1200 Representative spoke with at [place]: Corene Cornea  Prior Living Arrangements/Services Living arrangements for the past 2 months: ([place]) Lives with:: Self Patient language and need for interpreter reviewed:: Yes Do you feel safe going back to the place where you live?: Yes          Current home services: DME Criminal Activity/Legal Involvement Pertinent to Current Situation/Hospitalization: No - Comment as needed  Activities of Daily Living Home Assistive Devices/Equipment: Cane (specify quad or straight), Walker (specify type) ADL Screening (condition at time of admission) Patient's cognitive ability adequate to safely complete daily activities?: Yes Is the patient deaf or have difficulty hearing?: No Does the patient have difficulty seeing, even when wearing glasses/contacts?: No Does the patient have difficulty concentrating, remembering, or making decisions?: No Patient able to express need for assistance with ADLs?: Yes Does the patient have difficulty dressing or bathing?: No Independently performs ADLs?: Yes (appropriate for developmental age) Communication: Independent Dressing (OT): Independent Grooming: Independent Feeding: Independent Bathing: Independent Toileting: Independent In/Out Bed: Independent Walks in Home: Independent with device (comment) Does the patient have difficulty walking or climbing stairs?: No Weakness of Legs: Both Weakness of Arms/Hands: None  Permission Sought/Granted                  Emotional Assessment       Orientation: :  Oriented to Self, Oriented to Place, Oriented to  Time, Oriented to Situation   Psych Involvement: No (comment)  Admission diagnosis:  HCAP (healthcare-associated pneumonia) [J18.9] Nonspecific chest pain [R07.9] Patient Active Problem List   Diagnosis Date Noted  . Pneumonia 07/02/2019  . Acute liver failure 05/19/2019  . Hematemesis 05/19/2019  . Hepatitis   . Thrombocytopenia  ([place])   . Coagulopathy ([place])   . Sepsis ([place]) 02/28/2019  . Lobar pneumonia ([place]) 02/28/2019  . Acute respiratory failure ([place]) 02/04/2019  . Acute respiratory failure with hypoxemia ([place]) 01/27/2019  . Depression 01/07/2019  . MDD (major depressive disorder), single episode, severe , no psychosis ([place])   . Homelessness   . Acute respiratory failure with hypoxia ([place]) 12/25/2018  . End stage renal disease on dialysis ([place]) 12/25/2018  . Hypertension 12/25/2018  . Renal osteodystrophy 12/25/2018   PCP:  Patient, [name] Pharmacy:   [place], [place] [address] [place] [address] Phone: [phone] Fax: [phone]  [place], [place] [address] [place] [address] Phone: [phone] Fax: [phone]  [place], [place] [address] [place] [address] Phone: [phone] Fax: [phone]     Social Determinants of Health (SDOH) Interventions    Readmission Risk Interventions Readmission Risk Prevention Plan 07/03/2019 05/21/2019 03/03/2019  Transportation Screening Complete Complete -  Medication Review Press photographer) Complete Complete -  PCP or Specialist appointment within 3-5 days of discharge (No Data) - Complete  PCP/Specialist Appt Not Complete comments - - -  [place] or Home Care Consult Complete - -  SW Recovery Care/Counseling Consult - - -  [place] Not Applicable Not Applicable -

## 2019-07-03 NOTE — Progress Notes (Signed)
Pharmacy Antibiotic Note  Brett Small is a 43 y.o. male admitted on 07/02/2019 with possible pneumonia.  Pharmacy has been consulted for Zosyn dosing. Vancomycin was initially started, however the MRSA PCR was negative and the vancomycin was d/c. Leukocytosis has resolved, no overnight fevers, he remains on 2L NCO with good O2 sats. This is day #2 of antibiotic therapy  Patient with ESRD on HD TTS  Plan:  continue Zosyn 3.375 g IV q12h extended infusion  Height: 5\' 1"  (154.9 cm) Weight: (unable to measure on stretcher) IBW/kg (Calculated) : 52.3  Temp (24hrs), Avg:98.7 F (37.1 C), Min:98.3 F (36.8 C), Max:99.6 F (37.6 C)  Recent Labs  Lab 07/02/19 1241 07/02/19 1403 07/02/19 1619 07/03/19 0634  WBC 12.5*  --   --  10.4  CREATININE 5.11*  --   --  3.24*  LATICACIDVEN  --  3.2* 3.5*  --     Estimated Creatinine Clearance: 18.5 mL/min (A) (by C-G formula based on SCr of 3.24 mg/dL (H)).    Allergies  Allergen Reactions  . Codeine Nausea Only    Patient questioned this (??)  . Sulfa Antibiotics Hives and Nausea And Vomiting    Antimicrobials this admission: Vancomycin 9/10 >>9/11 Zosyn 9/10 >>  Microbiology results: 9/10 BCx: pending 9/10 MRSA PCR: pending 9/10 SARS Coronavirus 2: negative  Thank you for allowing pharmacy to be a part of this patient's care.  Dallie Piles, PharmD 07/03/2019 1:41 PM

## 2019-07-03 NOTE — Progress Notes (Signed)
[facility]  ROUNDING NOTE   Subjective:   Hemodialysis treatment yesterday. Tolerated treatment well. UF of 1 liter.   Patient denies any more chest pain, nausea or vomiting.   Objective:  Vital signs in last 24 hours:  Temp:  [98.3 F (36.8 C)-99.6 F (37.6 C)] 98.3 F (36.8 C) (09/11 1229) Pulse Rate:  [77-116] 77 (09/11 1229) Resp:  [13-26] 18 (09/11 1229) BP: (100-169)/(76-122) 100/76 (09/11 1229) SpO2:  [88 %-100 %] 96 % (09/11 1229)  Weight change:  Filed Weights   07/02/19 1233  Weight: 45.4 kg    Intake/Output: I/O last 3 completed shifts: In: 401.8 [I.V.:22.5; IV Piggyback:379.3] Out: 1001 [Other:1001]   Intake/Output this shift:  Total I/O In: 240 [P.O.:240] Out: -   Physical Exam: General: NAD, laying in stretcher  Head: Normocephalic, atraumatic. Moist oral mucosal membranes  Eyes: Anicteric, PERRL  Neck: Supple, trachea midline  Lungs:  Clear to auscultation  Heart: Regular rate and rhythm  Abdomen:  Soft, nontender,   Extremities:  no peripheral edema.  Neurologic: Nonfocal, moving all four extremities  Skin: No lesions  Access: Left AVG, RIJ permcath    Basic Metabolic Panel: Recent Labs  Lab 07/02/19 1241 07/03/19 0634  NA 136 138  K 3.7 3.2*  CL 100 101  CO2 17* 28  GLUCOSE 130* 156*  BUN 31* 18  CREATININE 5.11* 3.24*  CALCIUM 9.4 8.4*    Liver Function Tests: Recent Labs  Lab 07/02/19 1241  AST 19  ALT 14  ALKPHOS 96  BILITOT 3.9*  PROT 6.7  ALBUMIN 3.1*   Recent Labs  Lab 07/02/19 1241  LIPASE 26   No results for input(s): AMMONIA in the last 168 hours.  CBC: Recent Labs  Lab 07/02/19 1241 07/03/19 0634  WBC 12.5* 10.4  HGB 11.1* 9.9*  HCT 34.9* 30.7*  MCV 105.8* 104.4*  PLT 123* 107*    Cardiac Enzymes: No results for input(s): CKTOTAL, CKMB, CKMBINDEX, TROPONINI in the last 168 hours.  BNP: Invalid input(s): POCBNP  CBG: No results for input(s): GLUCAP in the last 168  hours.  Microbiology: Results for orders placed or performed during the hospital encounter of 07/02/19  SARS Coronavirus 2 [facility] order, Performed in [facility] hospital lab) Nasopharyngeal Nasopharyngeal Swab     Status: None   Collection Time: 07/02/19  2:03 PM   Specimen: Nasopharyngeal Swab  Result Value Ref Range Status   SARS Coronavirus 2 NEGATIVE NEGATIVE Final    Comment: (NOTE) If result is NEGATIVE SARS-CoV-2 target nucleic acids are NOT DETECTED. The SARS-CoV-2 RNA is generally detectable in upper and lower  respiratory specimens during the acute phase of infection. The lowest  concentration of SARS-CoV-2 viral copies this assay can detect is 250  copies / mL. A negative result does not preclude SARS-CoV-2 infection  and should not be used as the sole basis for treatment or other  patient management decisions.  A negative result may occur with  improper specimen collection / handling, submission of specimen other  than nasopharyngeal swab, presence of viral mutation(s) within the  areas targeted by this assay, and inadequate number of viral copies  (<250 copies / mL). A negative result must be combined with clinical  observations, patient history, and epidemiological information. If result is POSITIVE SARS-CoV-2 target nucleic acids are DETECTED. The SARS-CoV-2 RNA is generally detectable in upper and lower  respiratory specimens dur ing the acute phase of infection.  Positive  results are indicative of active infection  with SARS-CoV-2.  Clinical  correlation with patient history and other diagnostic information is  necessary to determine patient infection status.  Positive results do  not rule out bacterial infection or co-infection with other viruses. If result is PRESUMPTIVE POSTIVE SARS-CoV-2 nucleic acids MAY BE PRESENT.   A presumptive positive result was obtained on the submitted specimen  and confirmed on repeat testing.  While 2019 novel coronavirus   (SARS-CoV-2) nucleic acids may be present in the submitted sample  additional confirmatory testing may be necessary for epidemiological  and / or clinical management purposes  to differentiate between  SARS-CoV-2 and other Sarbecovirus currently known to infect humans.  If clinically indicated additional testing with an alternate test  methodology [phone]) is advised. The SARS-CoV-2 RNA is generally  detectable in upper and lower respiratory sp ecimens during the acute  phase of infection. The expected result is Negative. Fact Sheet for Patients:  StrictlyIdeas.no Fact Sheet for Healthcare Providers: BankingDealers.co.za This test is not yet approved or cleared by the [place] FDA and has been authorized for detection and/or diagnosis of SARS-CoV-2 by FDA under an Emergency Use Authorization (EUA).  This EUA will remain in effect (meaning this test can be used) for the duration of the COVID-19 declaration under Section 564(b)(1) of the Act, 21 U.S.C. section 360bbb-3(b)(1), unless the authorization is terminated or revoked sooner. Performed at [facility], [address] [place], [place] [address]   Blood culture (routine x 2)     Status: None (Preliminary result)   Collection Time: 07/02/19  2:03 PM   Specimen: BLOOD  Result Value Ref Range Status   Specimen Description BLOOD BLOOD RIGHT HAND  Final   Special Requests   Final    BOTTLES DRAWN AEROBIC AND ANAEROBIC Blood Culture adequate volume   Culture   Final    NO GROWTH < 24 HOURS Performed at [facility], [address] [place], [place] [address]    Report Status PENDING  Incomplete  Blood culture (routine x 2)     Status: None (Preliminary result)   Collection Time: 07/02/19  2:03 PM   Specimen: BLOOD  Result Value Ref Range Status   Specimen Description BLOOD RIGHT ANTECUBITAL  Final   Special Requests   Final    BOTTLES DRAWN AEROBIC AND  ANAEROBIC Blood Culture adequate volume   Culture   Final    NO GROWTH < 24 HOURS Performed at [facility], [address] [place], [place] [address]    Report Status PENDING  Incomplete  MRSA PCR Screening     Status: None   Collection Time: 07/02/19 10:20 PM   Specimen: Nasal Mucosa; Nasopharyngeal  Result Value Ref Range Status   MRSA by PCR NEGATIVE NEGATIVE Final    Comment:        The GeneXpert MRSA Assay (FDA approved for NASAL specimens only), is one component of a comprehensive MRSA colonization surveillance program. It is not intended to diagnose MRSA infection nor to guide or monitor treatment for MRSA infections. Performed at [facility], [address] [place], [place] [address]     Coagulation Studies: Recent Labs    07/02/19 1241  LABPROT 17.7*  INR 1.5*    Urinalysis: No results for input(s): COLORURINE, LABSPEC, PHURINE, GLUCOSEU, HGBUR, BILIRUBINUR, KETONESUR, PROTEINUR, UROBILINOGEN, NITRITE, LEUKOCYTESUR in the last 72 hours.  Invalid input(s): APPERANCEUR    Imaging: Dg Chest Port 1 View  Result Date: 07/02/2019 CLINICAL DATA:  Chest pain and  increased shortness of breath prior to scheduled dialysis. EXAM: PORTABLE CHEST 1 VIEW COMPARISON:  03/02/2019 FINDINGS: Interval right jugular double-lumen catheter in satisfactory position. No pneumothorax. Enlarged cardiac silhouette with interval increase in size. Minimally prominent vasculature without significant change. Interval mild patchy opacity at the left lung base and possible minimal left pleural effusion. The previously demonstrated left axillary stent is again stroke demonstrated with an additional stent more inferiorly in the upper arm. Unremarkable bones. IMPRESSION: 1. Interval mild patchy atelectasis or pneumonia at the left lung base and possible minimal left pleural effusion. 2. Progressive cardiomegaly with stable minimal remain minimal pulmonary vascular congestion.  Electronically Signed   By: Claudie Revering M.D.   On: 07/02/2019 13:13     Medications:   . sodium chloride Stopped (07/03/19 0120)  . piperacillin-tazobactam (ZOSYN)  IV 3.375 g (07/03/19 1019)   . amLODipine  10 mg Oral Q2000  . apixaban  2.5 mg Oral BID  . aspirin EC  81 mg Oral Daily  . atorvastatin  80 mg Oral QHS  . carvedilol  25 mg Oral BID WC  . Chlorhexidine Gluconate Cloth  6 each Topical Q0600  . feeding supplement (NEPRO CARB STEADY)  237 mL Oral BID BM  . gabapentin  100 mg Oral TID  . hydrALAZINE  25 mg Oral Q8H  . loratadine  10 mg Oral Daily  . multivitamin  1 tablet Oral Daily  . sevelamer carbonate  1,600 mg Oral TID WC   sodium chloride, acetaminophen **OR** acetaminophen, guaiFENesin-dextromethorphan, hydrOXYzine, ipratropium-albuterol, ondansetron **OR** ondansetron (ZOFRAN) IV, traMADol  Assessment/ Plan:  [name] is a 43 y.o. white male with end stage renal disease on hemodialysis, hypertension, peripheral vascular disease, depression who is admitted to [facility] on 07/02/2019 for pneumonia  [place]. TTS 46.5 kg Left AVG  1. End Stage Renal Disease:  - Continue TTS schedule  2. Hypertension: home regimen of amlodipine, carvedilol, and hydralazine.    3. Anemia of chronic kidney disease: hemoglobin 9.9. Macrocytic.  - EPO with TTS treatments.   4. Secondary Hyperparathyroidism:   - sevelamer with meals    LOS: 1 Alajiah Dutkiewicz 9/11/20201:29 PM

## 2019-07-03 NOTE — Progress Notes (Signed)
Advanced care plan. Purpose of the Encounter: CODE STATUS Parties in Attendance: Patient's Decision Capacity: Subjective/Patient's story: Brett Small  is a 43 y.o. male with a known history of end-stage renal disease on hemodialysis, hypertension, secondary hyperparathyroidism, neuropathy who presents to the hospital due to chest pain.  Patient says he was in his usual state of health and reported dialysis today and developed some chest pain prior to dialysis.  Patient developed the symptoms prior to dialysis and had associated shortness of breath nausea and vomiting with it.  He was concerned and therefore was brought to the ER for further evaluation.  Emergency room patient was noted to be slightly hypertensive with blood pressure greater than 170.  Patient was noted to have a mild leukocytosis and was noted to be mildly hypoxic and underwent a chest x-ray which showed suspected pneumonia.  Hospitalist services were contacted for admission. Patient denies any documented fever, chills, sick contacts, recent travel history.  Patient's COVID-19 test is still pending. Objective/Medical story Patient has pneumonia on chest x-ray needs IV antibiotics Needs nephrology evaluation and dialysis. Goals of care determination:  Advance care directives and goals of care discussed Patient wants everything done which includes cpr, intubation and ventilator if the need arises CODE STATUS: Full code Time spent discussing advanced care planning: 16 minutes

## 2019-07-03 NOTE — Progress Notes (Signed)
Established hemodialysis patient known at [facility] TTS 11:00, patient rides with [facility]. Please contact me directly with dialysis placement concerns.   [name] Dialysis Coordinator [phone]

## 2019-07-04 LAB — CBC
HCT: 30.2 % — ABNORMAL LOW (ref 39.0–52.0)
Hemoglobin: 9.4 g/dL — ABNORMAL LOW (ref 13.0–17.0)
MCH: 33 pg (ref 26.0–34.0)
MCHC: 31.1 g/dL (ref 30.0–36.0)
MCV: 106 fL — ABNORMAL HIGH (ref 80.0–100.0)
Platelets: 103 10*3/uL — ABNORMAL LOW (ref 150–400)
RBC: 2.85 MIL/uL — ABNORMAL LOW (ref 4.22–5.81)
RDW: 17 % — ABNORMAL HIGH (ref 11.5–15.5)
WBC: 9.2 10*3/uL (ref 4.0–10.5)
nRBC: 0 % (ref 0.0–0.2)

## 2019-07-04 LAB — BASIC METABOLIC PANEL
Anion gap: 13 (ref 5–15)
BUN: 31 mg/dL — ABNORMAL HIGH (ref 6–20)
CO2: 27 mmol/L (ref 22–32)
Calcium: 8.2 mg/dL — ABNORMAL LOW (ref 8.9–10.3)
Chloride: 98 mmol/L (ref 98–111)
Creatinine, Ser: 4.72 mg/dL — ABNORMAL HIGH (ref 0.61–1.24)
GFR calc Af Amer: 16 mL/min — ABNORMAL LOW (ref >60)
GFR calc non Af Amer: 14 mL/min — ABNORMAL LOW (ref >60)
Glucose, Bld: 154 mg/dL — ABNORMAL HIGH (ref 70–99)
Potassium: 3.4 mmol/L — ABNORMAL LOW (ref 3.5–5.1)
Sodium: 138 mmol/L (ref 135–145)

## 2019-07-04 LAB — HEPATITIS B SURFACE ANTIGEN: Hepatitis B Surface Ag: NEGATIVE

## 2019-07-04 MED ORDER — HEPARIN SODIUM (PORCINE) 1000 UNIT/ML DIALYSIS
50.0000 [IU]/kg | INTRAMUSCULAR | Status: DC | PRN
  Filled 2019-07-04: qty 3

## 2019-07-04 NOTE — Progress Notes (Signed)
This note also relates to the following rows which could not be included: Pulse Rate - Cannot attach notes to unvalidated device data Resp - Cannot attach notes to unvalidated device data BP - Cannot attach notes to unvalidated device data  Hd started  

## 2019-07-04 NOTE — Progress Notes (Signed)
Patient refused dressing change to dialysis catheter.  Stated it had been done today already.

## 2019-07-04 NOTE — Progress Notes (Signed)
[facility]  ROUNDING NOTE   Subjective:   Seen and examined on hemodialysis treatment. Tolerating treatment well. UF goal of 1.5 liters.   Nausea and vomiting this morning. Patient states he feels fine currently. Denies any shortness of breath, cough or chest pain.   Objective:  Vital signs in last 24 hours:  Temp:  [97.8 F (36.6 C)-98.3 F (36.8 C)] 98.2 F (36.8 C) (09/12 1045) Pulse Rate:  [71-77] 75 (09/12 1200) Resp:  [13-20] 14 (09/12 1200) BP: (88-123)/(68-93) 123/93 (09/12 1200) SpO2:  [94 %-100 %] 100 % (09/12 1045)  Weight change:  Filed Weights   07/02/19 1233  Weight: 45.4 kg    Intake/Output: I/O last 3 completed shifts: In: 631.8 [P.O.:480; I.V.:22.5; IV Piggyback:129.3] Out: 1401 [Urine:400; Other:1001]   Intake/Output this shift:  Total I/O In: 240 [P.O.:240] Out: -   Physical Exam: General: NAD, laying in stretcher  Head: Normocephalic, atraumatic. Moist oral mucosal membranes  Eyes: Anicteric, PERRL  Neck: Supple, trachea midline  Lungs:  Clear to auscultation  Heart: Regular rate and rhythm  Abdomen:  Soft, nontender,   Extremities:  no peripheral edema.  Neurologic: Nonfocal, moving all four extremities  Skin: No lesions  Access: Left AVG, RIJ permcath    Basic Metabolic Panel: Recent Labs  Lab 07/02/19 1241 07/03/19 0634 07/04/19 1053  NA 136 138 138  K 3.7 3.2* 3.4*  CL 100 101 98  CO2 17* 28 27  GLUCOSE 130* 156* 154*  BUN 31* 18 31*  CREATININE 5.11* 3.24* 4.72*  CALCIUM 9.4 8.4* 8.2*    Liver Function Tests: Recent Labs  Lab 07/02/19 1241  AST 19  ALT 14  ALKPHOS 96  BILITOT 3.9*  PROT 6.7  ALBUMIN 3.1*   Recent Labs  Lab 07/02/19 1241  LIPASE 26   No results for input(s): AMMONIA in the last 168 hours.  CBC: Recent Labs  Lab 07/02/19 1241 07/03/19 0634 07/04/19 1053  WBC 12.5* 10.4 9.2  HGB 11.1* 9.9* 9.4*  HCT 34.9* 30.7* 30.2*  MCV 105.8* 104.4* 106.0*  PLT 123* 107* 103*     Cardiac Enzymes: No results for input(s): CKTOTAL, CKMB, CKMBINDEX, TROPONINI in the last 168 hours.  BNP: Invalid input(s): POCBNP  CBG: No results for input(s): GLUCAP in the last 168 hours.  Microbiology: Results for orders placed or performed during the hospital encounter of 07/02/19  SARS Coronavirus 2 [facility] order, Performed in [facility] hospital lab) Nasopharyngeal Nasopharyngeal Swab     Status: None   Collection Time: 07/02/19  2:03 PM   Specimen: Nasopharyngeal Swab  Result Value Ref Range Status   SARS Coronavirus 2 NEGATIVE NEGATIVE Final    Comment: (NOTE) If result is NEGATIVE SARS-CoV-2 target nucleic acids are NOT DETECTED. The SARS-CoV-2 RNA is generally detectable in upper and lower  respiratory specimens during the acute phase of infection. The lowest  concentration of SARS-CoV-2 viral copies this assay can detect is 250  copies / mL. A negative result does not preclude SARS-CoV-2 infection  and should not be used as the sole basis for treatment or other  patient management decisions.  A negative result may occur with  improper specimen collection / handling, submission of specimen other  than nasopharyngeal swab, presence of viral mutation(s) within the  areas targeted by this assay, and inadequate number of viral copies  (<250 copies / mL). A negative result must be combined with clinical  observations, patient history, and epidemiological information. If result is POSITIVE SARS-CoV-2  target nucleic acids are DETECTED. The SARS-CoV-2 RNA is generally detectable in upper and lower  respiratory specimens dur ing the acute phase of infection.  Positive  results are indicative of active infection with SARS-CoV-2.  Clinical  correlation with patient history and other diagnostic information is  necessary to determine patient infection status.  Positive results do  not rule out bacterial infection or co-infection with other viruses. If result is  PRESUMPTIVE POSTIVE SARS-CoV-2 nucleic acids MAY BE PRESENT.   A presumptive positive result was obtained on the submitted specimen  and confirmed on repeat testing.  While 2019 novel coronavirus  (SARS-CoV-2) nucleic acids may be present in the submitted sample  additional confirmatory testing may be necessary for epidemiological  and / or clinical management purposes  to differentiate between  SARS-CoV-2 and other Sarbecovirus currently known to infect humans.  If clinically indicated additional testing with an alternate test  methodology [phone]) is advised. The SARS-CoV-2 RNA is generally  detectable in upper and lower respiratory sp ecimens during the acute  phase of infection. The expected result is Negative. Fact Sheet for Patients:  StrictlyIdeas.no Fact Sheet for Healthcare Providers: BankingDealers.co.za This test is not yet approved or cleared by the [place] FDA and has been authorized for detection and/or diagnosis of SARS-CoV-2 by FDA under an Emergency Use Authorization (EUA).  This EUA will remain in effect (meaning this test can be used) for the duration of the COVID-19 declaration under Section 564(b)(1) of the Act, 21 U.S.C. section 360bbb-3(b)(1), unless the authorization is terminated or revoked sooner. Performed at [facility], [address] [place], [place] [address]   Blood culture (routine x 2)     Status: None (Preliminary result)   Collection Time: 07/02/19  2:03 PM   Specimen: BLOOD  Result Value Ref Range Status   Specimen Description BLOOD BLOOD RIGHT HAND  Final   Special Requests   Final    BOTTLES DRAWN AEROBIC AND ANAEROBIC Blood Culture adequate volume   Culture   Final    NO GROWTH 2 DAYS Performed at [facility], [address] [place], [place] [address]    Report Status PENDING  Incomplete  Blood culture (routine x 2)     Status: None (Preliminary result)    Collection Time: 07/02/19  2:03 PM   Specimen: BLOOD  Result Value Ref Range Status   Specimen Description BLOOD RIGHT ANTECUBITAL  Final   Special Requests   Final    BOTTLES DRAWN AEROBIC AND ANAEROBIC Blood Culture adequate volume   Culture   Final    NO GROWTH 2 DAYS Performed at [facility], [address] [place], [place] [address]    Report Status PENDING  Incomplete  MRSA PCR Screening     Status: None   Collection Time: 07/02/19 10:20 PM   Specimen: Nasal Mucosa; Nasopharyngeal  Result Value Ref Range Status   MRSA by PCR NEGATIVE NEGATIVE Final    Comment:        The GeneXpert MRSA Assay (FDA approved for NASAL specimens only), is one component of a comprehensive MRSA colonization surveillance program. It is not intended to diagnose MRSA infection nor to guide or monitor treatment for MRSA infections. Performed at [facility], [address] [place], [place] [address]     Coagulation Studies: Recent Labs    07/02/19 1241  LABPROT 17.7*  INR 1.5*    Urinalysis: No results for input(s): COLORURINE, LABSPEC, [place], GLUCOSEU, HGBUR, BILIRUBINUR, KETONESUR,  PROTEINUR, UROBILINOGEN, NITRITE, LEUKOCYTESUR in the last 72 hours.  Invalid input(s): APPERANCEUR    Imaging: Dg Chest Port 1 View  Result Date: 07/02/2019 CLINICAL DATA:  Chest pain and increased shortness of breath prior to scheduled dialysis. EXAM: PORTABLE CHEST 1 VIEW COMPARISON:  03/02/2019 FINDINGS: Interval right jugular double-lumen catheter in satisfactory position. No pneumothorax. Enlarged cardiac silhouette with interval increase in size. Minimally prominent vasculature without significant change. Interval mild patchy opacity at the left lung base and possible minimal left pleural effusion. The previously demonstrated left axillary stent is again stroke demonstrated with an additional stent more inferiorly in the upper arm. Unremarkable bones. IMPRESSION: 1. Interval mild  patchy atelectasis or pneumonia at the left lung base and possible minimal left pleural effusion. 2. Progressive cardiomegaly with stable minimal remain minimal pulmonary vascular congestion. Electronically Signed   By: Claudie Revering M.D.   On: 07/02/2019 13:13     Medications:   . sodium chloride Stopped (07/03/19 0120)  . piperacillin-tazobactam (ZOSYN)  IV 3.375 g (07/03/19 2112)   . amLODipine  10 mg Oral Q2000  . apixaban  2.5 mg Oral BID  . aspirin EC  81 mg Oral Daily  . atorvastatin  80 mg Oral QHS  . carvedilol  25 mg Oral BID WC  . Chlorhexidine Gluconate Cloth  6 each Topical Q0600  . feeding supplement (NEPRO CARB STEADY)  237 mL Oral BID BM  . gabapentin  100 mg Oral TID  . hydrALAZINE  25 mg Oral Q8H  . loratadine  10 mg Oral Daily  . multivitamin  1 tablet Oral Daily  . sevelamer carbonate  1,600 mg Oral TID WC   sodium chloride, acetaminophen **OR** acetaminophen, guaiFENesin-dextromethorphan, heparin, hydrOXYzine, ipratropium-albuterol, ondansetron **OR** ondansetron (ZOFRAN) IV, traMADol  Assessment/ Plan:  Mr. Akilles Geib is a 43 y.o. white male with end stage renal disease on hemodialysis, hypertension, peripheral vascular disease, depression who is admitted to [facility] on 07/02/2019 for pneumonia  [place]. TTS 46.5 kg Left AVG  1. End Stage Renal Disease: seen and examined on hemodialysis treatment.  - Continue TTS schedule  2. Hypertension: home regimen of amlodipine, carvedilol, and hydralazine.    3. Anemia of chronic kidney disease: hemoglobin 9.4. Macrocytic.  - EPO with TTS treatments.   4. Secondary Hyperparathyroidism:   - sevelamer with meals   5. Pneumonia - IV pip/tazo   LOS: 2 Floyd Wade 9/12/202012:07 PM

## 2019-07-04 NOTE — Progress Notes (Signed)
[facility] [place] NAME: Brett Small    MR#:  [id number]  DATE OF BIRTH:  1975-11-04  SUBJECTIVE:  CHIEF COMPLAINT:   Chief Complaint  Patient presents with  . Chest Pain  Seen during dialysis, patient is otherwise feeling okay still on oxygen which is acute, will try to wean off the oxygen today.  REVIEW OF SYSTEMS:    ROS  CONSTITUTIONAL: No documented fever. Has fatigue, weakness. No weight gain, no weight loss.  EYES: No blurry or double vision.  ENT: No tinnitus. No postnasal drip. No redness of the oropharynx.  RESPIRATORY: occasional cough, no wheeze, no hemoptysis. Has dyspnea.  CARDIOVASCULAR: No chest pain. No orthopnea. No palpitations. No syncope.  GASTROINTESTINAL: No nausea, no vomiting or diarrhea. No abdominal pain. No melena or hematochezia.  GENITOURINARY: No dysuria or hematuria.  ENDOCRINE: No polyuria or nocturia. No heat or cold intolerance.  HEMATOLOGY: No anemia. No bruising. No bleeding.  INTEGUMENTARY: No rashes. No lesions.  MUSCULOSKELETAL: No arthritis. No swelling. No gout.  NEUROLOGIC: No numbness, tingling, or ataxia. No seizure-type activity.  PSYCHIATRIC: No anxiety. No insomnia. No ADD.   DRUG ALLERGIES:   Allergies  Allergen Reactions  . Codeine Nausea Only    Patient questioned this (??)  . Sulfa Antibiotics Hives and Nausea And Vomiting    VITALS:  Blood pressure 126/90, pulse 75, temperature 98.2 F (36.8 C), temperature source Oral, resp. rate 14, height 5\' 1"  (1.549 m), weight 45.4 kg, SpO2 100 %.  PHYSICAL EXAMINATION:   Physical Exam  GENERAL:  43 y.o.-year-old patient lying in the bed with no acute distress.  EYES: Pupils equal, round, reactive to light and accommodation. No scleral icterus. Extraocular muscles intact.  HEENT: Head atraumatic, normocephalic. Oropharynx and nasopharynx clear.  NECK:  Supple, no jugular venous distention. No thyroid enlargement, no tenderness.   LUNGS: Decreased breath sounds bilaterally, rales heard in left lung. No use of accessory muscles of respiration.  CARDIOVASCULAR: S1, S2 normal. No murmurs, rubs, or gallops.  ABDOMEN: Soft, nontender, nondistended. Bowel sounds present. No organomegaly or mass.  EXTREMITIES: No cyanosis, clubbing or edema b/l.    NEUROLOGIC: Cranial nerves II through XII are intact. No focal Motor or sensory deficits b/l.   PSYCHIATRIC: The patient is alert and oriented x 3.  SKIN: No obvious rash, lesion, or ulcer.   LABORATORY PANEL:   CBC Recent Labs  Lab 07/04/19 1053  WBC 9.2  HGB 9.4*  HCT 30.2*  PLT 103*   ------------------------------------------------------------------------------------------------------------------ Chemistries  Recent Labs  Lab 07/02/19 1241  07/04/19 1053  NA 136   < > 138  K 3.7   < > 3.4*  CL 100   < > 98  CO2 17*   < > 27  GLUCOSE 130*   < > 154*  BUN 31*   < > 31*  CREATININE 5.11*   < > 4.72*  CALCIUM 9.4   < > 8.2*  AST 19  --   --   ALT 14  --   --   ALKPHOS 96  --   --   BILITOT 3.9*  --   --    < > = values in this interval not displayed.   ------------------------------------------------------------------------------------------------------------------  Cardiac Enzymes No results for input(s): TROPONINI in the last 168 hours. ------------------------------------------------------------------------------------------------------------------  RADIOLOGY:  Dg Chest Port 1 View  Result Date: 07/02/2019 CLINICAL DATA:  Chest pain and increased shortness of breath prior to scheduled  dialysis. EXAM: PORTABLE CHEST 1 VIEW COMPARISON:  03/02/2019 FINDINGS: Interval right jugular double-lumen catheter in satisfactory position. No pneumothorax. Enlarged cardiac silhouette with interval increase in size. Minimally prominent vasculature without significant change. Interval mild patchy opacity at the left lung base and possible minimal left pleural effusion.  The previously demonstrated left axillary stent is again stroke demonstrated with an additional stent more inferiorly in the upper arm. Unremarkable bones. IMPRESSION: 1. Interval mild patchy atelectasis or pneumonia at the left lung base and possible minimal left pleural effusion. 2. Progressive cardiomegaly with stable minimal remain minimal pulmonary vascular congestion. Electronically Signed   By: Claudie Revering M.D.   On: 07/02/2019 13:13     ASSESSMENT AND PLAN:   43 y.o. male with a known history of end-stage renal disease on hemodialysis, hypertension, secondary hyperparathyroidism, neuropathy currently under hospitalist service  1.  Acute respiratory failure with hypoxia-secondary to suspected pneumonia seen on chest x-ray. - oxygen via [place] -Continue IV Zosyn abx Wean off oxygen today and see how she does. 2.  Pneumonia-source of patient's suspected hypoxemia and chest pain. -We will treat the patient IV Zosyn.  Follow cultures. -MRSA pcr negative DC vanc  3.  End-stage renal disease on hemodialysis-patient get dialysis on Tuesday Thursday Saturday. -Patient had dialysis today, appreciate nephrology following.4.  Essential hypertension-continue Norvasc, hydralazine, carvedilol.  5.  Hyperlipidemia-continue atorvastatin.  6.  Neuropathy-continue gabapentin.  7.  Secondary hyperparathyroidism-continue Renvela. Possible discharge home with home health physical therapy tomorrow. Spoke with the nurse. All the records are reviewed and case discussed with Care Management/Social Worker. Management plans discussed with the patient, family and they are in agreement.  CODE STATUS: Full code  DVT Prophylaxis: SCDs  TOTAL TIME TAKING CARE OF THIS PATIENT: 36 minutes.   POSSIBLE D/C IN 1 to 2 DAYS, DEPENDING ON CLINICAL CONDITION.  Epifanio Lesches M.D on 07/04/2019 at 12:36 PM  Between 7am to 6pm - Pager - [phone]  After 6pm go to [email or web address] - password [email or web address]  [facility]  Office  [phone]  CC: Primary care physician; Patient, [name]  Note: This dictation was prepared with Dragon dictation along with smaller phrase technology. Any transcriptional errors that result from this process are unintentional.

## 2019-07-04 NOTE — Progress Notes (Signed)
Hd completed 

## 2019-07-05 NOTE — Progress Notes (Signed)
Pharmacy Antibiotic Note  Brett Small is a 43 y.o. male admitted on 07/02/2019 with possible pneumonia.  Pharmacy has been consulted for Zosyn dosing. Vancomycin was initially started, however the MRSA PCR was negative and the vancomycin was d/c. Leukocytosis has resolved, no overnight fevers, he remains on 2L NCO with good O2 sats. This is day 4 of antibiotic therapy  Patient with ESRD on HD TTS  Plan: Continue Zosyn 3.375 g IV q12h extended infusion.   Height: 5\' 1"  (154.9 cm) Weight: (unable to measure on stretcher) IBW/kg (Calculated) : 52.3  Temp (24hrs), Avg:97.9 F (36.6 C), Min:97.6 F (36.4 C), Max:98 F (36.7 C)  Recent Labs  Lab 07/02/19 1241 07/02/19 1403 07/02/19 1619 07/03/19 0634 07/04/19 1053  WBC 12.5*  --   --  10.4 9.2  CREATININE 5.11*  --   --  3.24* 4.72*  LATICACIDVEN  --  3.2* 3.5*  --   --     Estimated Creatinine Clearance: 12.7 mL/min (A) (by C-G formula based on SCr of 4.72 mg/dL (H)).    Allergies  Allergen Reactions  . Codeine Nausea Only    Patient questioned this (??)  . Sulfa Antibiotics Hives and Nausea And Vomiting    Antimicrobials this admission: Vancomycin 9/10 >>9/11 Zosyn 9/10 >>  Microbiology results: 9/10 BCx: no growth x 3 days  9/10 MRSA PCR: negative  9/10 SARS Coronavirus 2: negative  Thank you for allowing pharmacy to be a part of this patient's care.  Simpson,Michael L, RPh 07/05/2019 2:48 PM

## 2019-07-05 NOTE — Progress Notes (Signed)
[facility]  ROUNDING NOTE   Subjective:   Hemodialysis treatment yesterday. Tolerated treatment well. UF of 1 liter.   Patient with episode of nausea and vomiting this morning.   Continues to have a cough  Objective:  Vital signs in last 24 hours:  Temp:  [97.6 F (36.4 C)-98.6 F (37 C)] 98 F (36.7 C) (09/13 0408) Pulse Rate:  [72-77] 72 (09/13 0408) Resp:  [13-22] 18 (09/13 0408) BP: (100-128)/(77-90) 118/87 (09/13 0408) SpO2:  [97 %-99 %] 98 % (09/13 0408)  Weight change:  Filed Weights   07/02/19 1233  Weight: 45.4 kg    Intake/Output: I/O last 3 completed shifts: In: 240 [P.O.:240] Out: 1000 [Other:1000]   Intake/Output this shift:  Total I/O In: 440 [P.O.:240; IV Piggyback:200] Out: -   Physical Exam: General: NAD, laying in stretcher  Head: Normocephalic, atraumatic. Moist oral mucosal membranes  Eyes: Anicteric, PERRL  Neck: Supple, trachea midline  Lungs:  Clear to auscultation  Heart: Regular rate and rhythm  Abdomen:  Soft, nontender,   Extremities:  no peripheral edema.  Neurologic: Nonfocal, moving all four extremities  Skin: No lesions  Access: Left AVG, RIJ permcath    Basic Metabolic Panel: Recent Labs  Lab 07/02/19 1241 07/03/19 0634 07/04/19 1053  NA 136 138 138  K 3.7 3.2* 3.4*  CL 100 101 98  CO2 17* 28 27  GLUCOSE 130* 156* 154*  BUN 31* 18 31*  CREATININE 5.11* 3.24* 4.72*  CALCIUM 9.4 8.4* 8.2*    Liver Function Tests: Recent Labs  Lab 07/02/19 1241  AST 19  ALT 14  ALKPHOS 96  BILITOT 3.9*  PROT 6.7  ALBUMIN 3.1*   Recent Labs  Lab 07/02/19 1241  LIPASE 26   No results for input(s): AMMONIA in the last 168 hours.  CBC: Recent Labs  Lab 07/02/19 1241 07/03/19 0634 07/04/19 1053  WBC 12.5* 10.4 9.2  HGB 11.1* 9.9* 9.4*  HCT 34.9* 30.7* 30.2*  MCV 105.8* 104.4* 106.0*  PLT 123* 107* 103*    Cardiac Enzymes: No results for input(s): CKTOTAL, CKMB, CKMBINDEX, TROPONINI in the last  168 hours.  BNP: Invalid input(s): POCBNP  CBG: No results for input(s): GLUCAP in the last 168 hours.  Microbiology: Results for orders placed or performed during the hospital encounter of 07/02/19  SARS Coronavirus 2 [facility] order, Performed in [facility] hospital lab) Nasopharyngeal Nasopharyngeal Swab     Status: None   Collection Time: 07/02/19  2:03 PM   Specimen: Nasopharyngeal Swab  Result Value Ref Range Status   SARS Coronavirus 2 NEGATIVE NEGATIVE Final    Comment: (NOTE) If result is NEGATIVE SARS-CoV-2 target nucleic acids are NOT DETECTED. The SARS-CoV-2 RNA is generally detectable in upper and lower  respiratory specimens during the acute phase of infection. The lowest  concentration of SARS-CoV-2 viral copies this assay can detect is 250  copies / mL. A negative result does not preclude SARS-CoV-2 infection  and should not be used as the sole basis for treatment or other  patient management decisions.  A negative result may occur with  improper specimen collection / handling, submission of specimen other  than nasopharyngeal swab, presence of viral mutation(s) within the  areas targeted by this assay, and inadequate number of viral copies  (<250 copies / mL). A negative result must be combined with clinical  observations, patient history, and epidemiological information. If result is POSITIVE SARS-CoV-2 target nucleic acids are DETECTED. The SARS-CoV-2 RNA is generally detectable  in upper and lower  respiratory specimens dur ing the acute phase of infection.  Positive  results are indicative of active infection with SARS-CoV-2.  Clinical  correlation with patient history and other diagnostic information is  necessary to determine patient infection status.  Positive results do  not rule out bacterial infection or co-infection with other viruses. If result is PRESUMPTIVE POSTIVE SARS-CoV-2 nucleic acids MAY BE PRESENT.   A presumptive positive result was  obtained on the submitted specimen  and confirmed on repeat testing.  While 2019 novel coronavirus  (SARS-CoV-2) nucleic acids may be present in the submitted sample  additional confirmatory testing may be necessary for epidemiological  and / or clinical management purposes  to differentiate between  SARS-CoV-2 and other Sarbecovirus currently known to infect humans.  If clinically indicated additional testing with an alternate test  methodology [phone]) is advised. The SARS-CoV-2 RNA is generally  detectable in upper and lower respiratory sp ecimens during the acute  phase of infection. The expected result is Negative. Fact Sheet for Patients:  StrictlyIdeas.no Fact Sheet for Healthcare Providers: BankingDealers.co.za This test is not yet approved or cleared by the [place] FDA and has been authorized for detection and/or diagnosis of SARS-CoV-2 by FDA under an Emergency Use Authorization (EUA).  This EUA will remain in effect (meaning this test can be used) for the duration of the COVID-19 declaration under Section 564(b)(1) of the Act, 21 U.S.C. section 360bbb-3(b)(1), unless the authorization is terminated or revoked sooner. Performed at [facility], [address] [place], [place] [address]   Blood culture (routine x 2)     Status: None (Preliminary result)   Collection Time: 07/02/19  2:03 PM   Specimen: BLOOD  Result Value Ref Range Status   Specimen Description BLOOD BLOOD RIGHT HAND  Final   Special Requests   Final    BOTTLES DRAWN AEROBIC AND ANAEROBIC Blood Culture adequate volume   Culture   Final    NO GROWTH 3 DAYS Performed at [facility], [address] [place], [place] [address]    Report Status PENDING  Incomplete  Blood culture (routine x 2)     Status: None (Preliminary result)   Collection Time: 07/02/19  2:03 PM   Specimen: BLOOD  Result Value Ref Range Status   Specimen  Description BLOOD RIGHT ANTECUBITAL  Final   Special Requests   Final    BOTTLES DRAWN AEROBIC AND ANAEROBIC Blood Culture adequate volume   Culture   Final    NO GROWTH 3 DAYS Performed at [facility], [address] [place], [place] [address]    Report Status PENDING  Incomplete  MRSA PCR Screening     Status: None   Collection Time: 07/02/19 10:20 PM   Specimen: Nasal Mucosa; Nasopharyngeal  Result Value Ref Range Status   MRSA by PCR NEGATIVE NEGATIVE Final    Comment:        The GeneXpert MRSA Assay (FDA approved for NASAL specimens only), is one component of a comprehensive MRSA colonization surveillance program. It is not intended to diagnose MRSA infection nor to guide or monitor treatment for MRSA infections. Performed at [facility], [address] [place], [place] [address]     Coagulation Studies: Recent Labs    07/02/19 1241  LABPROT 17.7*  INR 1.5*    Urinalysis: No results for input(s): COLORURINE, LABSPEC, PHURINE, GLUCOSEU, HGBUR, BILIRUBINUR, KETONESUR, PROTEINUR, UROBILINOGEN, NITRITE, LEUKOCYTESUR in the last 72 hours.  Invalid  input(s): APPERANCEUR    Imaging: No results found.   Medications:   . sodium chloride Stopped (07/03/19 0120)  . piperacillin-tazobactam (ZOSYN)  IV 3.375 g (07/05/19 0909)   . amLODipine  10 mg Oral Q2000  . apixaban  2.5 mg Oral BID  . aspirin EC  81 mg Oral Daily  . atorvastatin  80 mg Oral QHS  . carvedilol  25 mg Oral BID WC  . Chlorhexidine Gluconate Cloth  6 each Topical Q0600  . feeding supplement (NEPRO CARB STEADY)  237 mL Oral BID BM  . gabapentin  100 mg Oral TID  . hydrALAZINE  25 mg Oral Q8H  . loratadine  10 mg Oral Daily  . multivitamin  1 tablet Oral Daily  . sevelamer carbonate  1,600 mg Oral TID WC   sodium chloride, acetaminophen **OR** acetaminophen, guaiFENesin-dextromethorphan, hydrOXYzine, ipratropium-albuterol, ondansetron **OR** ondansetron (ZOFRAN) IV,  traMADol  Assessment/ Plan:  Brett Small is a 43 y.o. white male with end stage renal disease on hemodialysis, hypertension, peripheral vascular disease, depression who is admitted to [facility] on 07/02/2019 for pneumonia  [place]. TTS 46.5 kg Left AVG  1. End Stage Renal Disease:  - Continue TTS schedule  2. Hypertension: home regimen of amlodipine, carvedilol, and hydralazine.    3. Anemia of chronic kidney disease: hemoglobin 9.4. Macrocytic.  - EPO with TTS treatments.   4. Secondary Hyperparathyroidism:   - sevelamer with meals   5. Pneumonia - IV pip/tazo   LOS: 3 Markelle Najarian 9/13/202012:03 PM

## 2019-07-05 NOTE — Progress Notes (Signed)
[facility] [place] NAME: Brett Small    MR#:  [id number]  DATE OF BIRTH:  1976/05/28  SUBJECTIVE: Seen at bedside, patient he is off oxygen but he feels he has some cough.  Wants to go home tomorrow.  CHIEF COMPLAINT:   Chief Complaint  Patient presents with  . Chest Pain  Patient does have some cough.  REVIEW OF SYSTEMS:    Review of Systems  Respiratory: Positive for cough.     CONSTITUTIONAL: No documented fever. Has fatigue, weakness. No weight gain, no weight loss.  EYES: No blurry or double vision.  ENT: No tinnitus. No postnasal drip. No redness of the oropharynx.  RESPIRATORY: occasional cough, no wheeze, no hemoptysis. Has dyspnea.  CARDIOVASCULAR: No chest pain. No orthopnea. No palpitations. No syncope.  GASTROINTESTINAL: No nausea, no vomiting or diarrhea. No abdominal pain. No melena or hematochezia.  GENITOURINARY: No dysuria or hematuria.  ENDOCRINE: No polyuria or nocturia. No heat or cold intolerance.  HEMATOLOGY: No anemia. No bruising. No bleeding.  INTEGUMENTARY: No rashes. No lesions.  MUSCULOSKELETAL: No arthritis. No swelling. No gout.  NEUROLOGIC: No numbness, tingling, or ataxia. No seizure-type activity.  PSYCHIATRIC: No anxiety. No insomnia. No ADD.   DRUG ALLERGIES:   Allergies  Allergen Reactions  . Codeine Nausea Only    Patient questioned this (??)  . Sulfa Antibiotics Hives and Nausea And Vomiting    VITALS:  Blood pressure 118/87, pulse 72, temperature 98 F (36.7 C), resp. rate 18, height 5\' 1"  (1.549 m), weight 45.4 kg, SpO2 98 %.  PHYSICAL EXAMINATION:   Physical Exam  GENERAL:  43 y.o.-year-old patient lying in the bed with no acute distress.  EYES: Pupils equal, round, reactive to light and accommodation. No scleral icterus. Extraocular muscles intact.  HEENT: Head atraumatic, normocephalic. Oropharynx and nasopharynx clear.  NECK:  Supple, no jugular venous distention. No thyroid  enlargement, no tenderness.  LUNGS: Decreased breath sounds bilaterally, rales heard in left lung. No use of accessory muscles of respiration.  CARDIOVASCULAR: S1, S2 normal. No murmurs, rubs, or gallops.  ABDOMEN: Soft, nontender, nondistended. Bowel sounds present. No organomegaly or mass.  EXTREMITIES: No cyanosis, clubbing or edema b/l.    NEUROLOGIC: Cranial nerves II through XII are intact. No focal Motor or sensory deficits b/l.   PSYCHIATRIC: The patient is alert and oriented x 3.  SKIN: No obvious rash, lesion, or ulcer.   LABORATORY PANEL:   CBC Recent Labs  Lab 07/04/19 1053  WBC 9.2  HGB 9.4*  HCT 30.2*  PLT 103*   ------------------------------------------------------------------------------------------------------------------ Chemistries  Recent Labs  Lab 07/02/19 1241  07/04/19 1053  NA 136   < > 138  K 3.7   < > 3.4*  CL 100   < > 98  CO2 17*   < > 27  GLUCOSE 130*   < > 154*  BUN 31*   < > 31*  CREATININE 5.11*   < > 4.72*  CALCIUM 9.4   < > 8.2*  AST 19  --   --   ALT 14  --   --   ALKPHOS 96  --   --   BILITOT 3.9*  --   --    < > = values in this interval not displayed.   ------------------------------------------------------------------------------------------------------------------  Cardiac Enzymes No results for input(s): TROPONINI in the last 168 hours. ------------------------------------------------------------------------------------------------------------------  RADIOLOGY:  No results found.   ASSESSMENT AND PLAN:   43  y.o. male with a known history of end-stage renal disease on hemodialysis, hypertension, secondary hyperparathyroidism, neuropathy currently under hospitalist service  1.  Acute respiratory failure with hypoxia-secondary to suspected pneumonia seen on chest x-ray. - o off oxygen, continue Zosyn.  Likely discharge home tomorrow with oral antibiotics.  2.  .  End-stage renal disease on hemodialysis-patient get  dialysis on Tuesday Thursday Saturday. -Patient had dialysis today, appreciate nephrology following.4.  Essential hypertension-continue Norvasc, hydralazine, carvedilol.  5.  Hyperlipidemia-continue atorvastatin.  6.  Neuropathy-continue gabapentin.  7.  Secondary hyperparathyroidism-continue Renvela. Possible discharge home with home health physical therapy tomorrow. Spoke with the nurse. All the records are reviewed and case discussed with Care Management/Social Worker. Management plans discussed with the patient, family and they are in agreement.  CODE STATUS: Full code  DVT Prophylaxis: SCDs  TOTAL TIME TAKING CARE OF THIS PATIENT: 36 minutes.  More than 50% time spent in counseling, coordination of care POSSIBLE D/C IN 1 to 2 DAYS, DEPENDING ON CLINICAL CONDITION.  Epifanio Lesches M.D on 07/05/2019 at 10:53 AM  Between 7am to 6pm - Pager - [phone]  After 6pm go to [email or web address] - password [email or web address] [facility]  Office  [phone]  CC: Primary care physician; Patient, [name]  Note: This dictation was prepared with Dragon dictation along with smaller phrase technology. Any transcriptional errors that result from this process are unintentional.

## 2019-07-06 MED ORDER — AMOXICILLIN-POT CLAVULANATE 875-125 MG PO TABS: 1.0000 | ORAL_TABLET | Freq: Two times a day (BID) | ORAL | 0 refills | Status: DC

## 2019-07-06 MED ORDER — NEPRO/CARBSTEADY PO LIQD: 237.0000 mL | Freq: Two times a day (BID) | ORAL | 30 refills | Status: DC

## 2019-07-06 MED ORDER — AZITHROMYCIN 250 MG PO TABS: ORAL_TABLET | ORAL | 0 refills | Status: DC

## 2019-07-06 NOTE — Progress Notes (Signed)
Pt discharged per MD order. IV removed. Discharge instructions reviewed with pt. Pt verbalized understanding and all questions answered to pt satisfaction. Pt taken to car in wheelchair by staff.  

## 2019-07-06 NOTE — TOC Transition Note (Signed)
Transition of Care [facility]) - CM/SW Discharge Note   Patient Details  Name: Brett Small MRN: [id number] Date of Birth: 12-25-1975  Transition of Care [facility]) CM/SW Contact:  Beverly Sessions, RN Phone Number: 07/06/2019, 3:50 PM   Clinical Narrative:    Patient discharged home today. Corene Cornea with [place] notified of discharge  Friend transported home   Final next level of care: [place] Services Barriers to Discharge: No Barriers Identified   Patient Goals and CMS Choice   CMS Medicare.gov Compare Post Acute Care list provided to:: Patient Choice offered to / list presented to : Patient  Discharge Placement                       Discharge Plan and Services     Post Acute Care Choice: Home Health                    HH Arranged: RN, PT, Nurse's Aide [facility] Agency: [place] ([place]) Date [facility] Agency Contacted: 07/06/19 Time [place]: 1200 Representative spoke with at [place]: [place] ([place]) Interventions     Readmission Risk Interventions Readmission Risk Prevention Plan 07/03/2019 05/21/2019 03/03/2019  Transportation Screening Complete Complete -  Medication Review Press photographer) Complete Complete -  PCP or Specialist appointment within 3-5 days of discharge (No Data) - Complete  PCP/Specialist Appt Not Complete comments - - -  [place] or Home Care Consult Complete - -  SW Recovery Care/Counseling Consult - - -  [place] Not Applicable Not Applicable -

## 2019-07-06 NOTE — Care Management Important Message (Signed)
Important Message  Patient Details  Name: Brett Small MRN: [id number] Date of Birth: 1975-11-22   Medicare Important Message Given:  Yes     Dannette Barbara 07/06/2019, 11:31 AM

## 2019-07-06 NOTE — Discharge Summary (Signed)
Brett Small, is a 43 y.o. male  DOB 1976-05-28  MRN [id number].  Admission date:  07/02/2019  Admitting Physician  Henreitta Leber, MD  Discharge Date:  07/06/2019   Primary MD  Patient, [name]  Recommendations for primary care physician for things to follow:  Follow-up with nephrology for routine hemodialysis. Admission Diagnosis  HCAP (healthcare-associated pneumonia) [J18.9] Nonspecific chest pain [R07.9]   Discharge Diagnosis  HCAP (healthcare-associated pneumonia) [J18.9] Nonspecific chest pain [R07.9]    Active Problems:   Pneumonia      Past Medical History:  Diagnosis Date  . ESRD (end stage renal disease) ([place])   . Hypertension   . Renal disorder   . Secondary hyperparathyroidism of renal origin [facility])     Past Surgical History:  Procedure Laterality Date  . A/V SHUNT INTERVENTION Left 01/19/2019   Procedure: LEFT UPPER EXTREMITY A/V SHUNTOGRAM / UPPER EXTREMITY ANGIOGRAM;  Surgeon: Algernon Huxley, MD;  Location: [place] [facility];  Service: Cardiovascular;  Laterality: Left;  . A/V SHUNT INTERVENTION Left 03/02/2019   Procedure: A/V SHUNT INTERVENTION;  Surgeon: Algernon Huxley, MD;  Location: [place] [facility];  Service: Cardiovascular;  Laterality: Left;  . AORTA - FEMORAL ARTERY BYPASS GRAFT    . AV FISTULA PLACEMENT Left 12/26/2018   Procedure: INSERTION OF GORE STRETCH VASCULAR 4-7MM X  45CM IN LEFT UPPER ARM;  Surgeon: Marty Heck, MD;  Location: [place];  Service: Vascular;  Laterality: Left;  . DIALYSIS/PERMA CATHETER REMOVAL N/A 02/06/2019   Procedure: DIALYSIS/PERMA CATHETER REMOVAL;  Surgeon: Algernon Huxley, MD;  Location: [place] [facility];  Service: Cardiovascular;  Laterality: N/A;       History of present illness and  Hospital Course:     Kindly see H&P for history of  present illness and admission details, please review complete Labs, Consult reports and Test reports for all details in brief  HPI  from the history and physical done on the day of admission  43 year old male with ESRD on hemodialysis Tuesday, Thursday, Saturday, essential hypertension comes in because of chest pain and found to have pneumonia and admitted for the same.  Hospital Course   #1 acute respiratory failure with hypoxia secondary to bacterial pneumonia, patient developed chest pain, shortness of breath at dialysis center sent here.  O2 sats were mid 80s on admission.  Admitted to hospitalist service, started on oxygen, Zosyn, vancomycin, MRSA screen negative, vancomycin stopped, continued on Zosyn, initially required oxygen, weaned off oxygen, saturation now is 96% on room air. 2.  ESRD on hemodialysis Tuesday, Thursday, Saturday, noted to have elevated blood failure on admission but improved now to normal BP.  #3 .deconditioning, h physical therapy recommends home health physical therapy.  #4 essential hypertension, uncontrolled when he is admitted but better now.  Continue home dose medications including Norvasc, hydralazine, Coreg. History of neuropathy, continue Neurontin.  #5 .history of nonalcoholic fatty liver disease, recent admission to [place] for liver failure, discharged home with home health.  Avoid Tylenol at discharge.  Patient LFTs normal with normal AST 19, ALT 14 September 10.  #6. history of peripheral artery disease, status post IR left femoral bypass graft in January 2020 by vascular.  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  Continue that. , Neuropathy, continue tramadol, Neurontin.  Avoid acetaminophen.  Discharge Condition: Stable   Follow UP  Follow-up Information    [facility] On 07/14/2019.   Why: At  230pm With Dr. Paula Compton information: [place] [address] [phone]              Discharge Instructions  and  Discharge Medications      Allergies as of 07/06/2019      Reactions   Codeine Nausea Only   Patient questioned this (??)   Sulfa Antibiotics Hives, Nausea And Vomiting      Medication List    TAKE these medications   amLODipine 10 MG tablet Commonly known as: NORVASC Take 1 tablet (10 mg total) by mouth daily at 8 pm.   amoxicillin-clavulanate 875-125 MG tablet Commonly known as: Augmentin Take 1 tablet by mouth 2 (two) times daily for 7 days.   apixaban 2.5 MG Tabs tablet Commonly known as: ELIQUIS Take 1 tablet (2.5 mg total) by mouth 2 (two) times daily.   aspirin EC 81 MG tablet Take 1 tablet (81 mg total) by mouth daily.   atorvastatin 80 MG tablet Commonly known as: LIPITOR Take 1 tablet (80 mg total) by mouth at bedtime.   azithromycin 250 MG tablet Commonly known as: Zithromax Z-Pak Take 2 tablets (500 mg) on  Day 1,  followed by 1 tablet (250 mg) once daily on Days 2 through 5.   carvedilol 25 MG tablet Commonly known as: COREG Take 1 tablet (25 mg total) by mouth 2 (two) times daily with a meal.   feeding supplement (NEPRO CARB STEADY) Liqd Take 237 mLs by mouth 2 (two) times daily between meals.   gabapentin 100 MG capsule Commonly known as: NEURONTIN Take 1 capsule (100 mg total) by mouth 3 (three) times daily.   hydrALAZINE 25 MG tablet Commonly known as: APRESOLINE Take 1 tablet (25 mg total) by mouth every 8 (eight) hours.   hydrOXYzine 25 MG tablet Commonly known as: ATARAX/VISTARIL Take 25 mg by mouth 3 (three) times daily as needed for anxiety.   loratadine 10 MG tablet Commonly known as: CLARITIN Take 1 tablet (10 mg total) by mouth daily.   b complex-C-folic acid 1 MG capsule Take 1 capsule by mouth. With supper.   multivitamin Tabs tablet Take 1 tablet by mouth daily.   neomycin-bacitracin-polymyxin Oint Commonly known as: NEOSPORIN Apply 1 application topically 2 (two) times daily.    sevelamer carbonate 800 MG tablet Commonly known as: RENVELA Take 2 tablets (1,600 mg total) by mouth 3 (three) times daily with meals.   traMADol 50 MG tablet Commonly known as: ULTRAM Take 1 tablet (50 mg total) by mouth every 12 (twelve) hours as needed for moderate pain or severe pain.         Diet and Activity recommendation: See Discharge Instructions above   Consults obtained -nephrology   Major procedures and Radiology Reports - PLEASE review detailed and final reports for all details, in brief -      Dg Chest Port 1 View  Result Date: 07/02/2019 CLINICAL DATA:  Chest pain and increased shortness of breath prior to scheduled dialysis. EXAM: PORTABLE CHEST 1 VIEW COMPARISON:  03/02/2019 FINDINGS: Interval right jugular double-lumen catheter in satisfactory position. No pneumothorax. Enlarged cardiac silhouette with interval increase in size. Minimally prominent vasculature without significant change. Interval mild patchy opacity at the left lung base and possible minimal left pleural effusion. The previously demonstrated left axillary stent is again stroke demonstrated with an additional stent more inferiorly in the upper arm. Unremarkable bones. IMPRESSION: 1. Interval mild patchy atelectasis or pneumonia at the left lung base and possible minimal  left pleural effusion. 2. Progressive cardiomegaly with stable minimal remain minimal pulmonary vascular congestion. Electronically Signed   By: Claudie Revering M.D.   On: 07/02/2019 13:13    Micro Results     Recent Results (from the past 240 hour(s))  SARS Coronavirus 2 [facility] order, Performed in [facility] hospital lab) Nasopharyngeal Nasopharyngeal Swab     Status: None   Collection Time: 07/02/19  2:03 PM   Specimen: Nasopharyngeal Swab  Result Value Ref Range Status   SARS Coronavirus 2 NEGATIVE NEGATIVE Final    Comment: (NOTE) If result is NEGATIVE SARS-CoV-2 target nucleic acids are NOT DETECTED. The SARS-CoV-2  RNA is generally detectable in upper and lower  respiratory specimens during the acute phase of infection. The lowest  concentration of SARS-CoV-2 viral copies this assay can detect is 250  copies / mL. A negative result does not preclude SARS-CoV-2 infection  and should not be used as the sole basis for treatment or other  patient management decisions.  A negative result may occur with  improper specimen collection / handling, submission of specimen other  than nasopharyngeal swab, presence of viral mutation(s) within the  areas targeted by this assay, and inadequate number of viral copies  (<250 copies / mL). A negative result must be combined with clinical  observations, patient history, and epidemiological information. If result is POSITIVE SARS-CoV-2 target nucleic acids are DETECTED. The SARS-CoV-2 RNA is generally detectable in upper and lower  respiratory specimens dur ing the acute phase of infection.  Positive  results are indicative of active infection with SARS-CoV-2.  Clinical  correlation with patient history and other diagnostic information is  necessary to determine patient infection status.  Positive results do  not rule out bacterial infection or co-infection with other viruses. If result is PRESUMPTIVE POSTIVE SARS-CoV-2 nucleic acids MAY BE PRESENT.   A presumptive positive result was obtained on the submitted specimen  and confirmed on repeat testing.  While 2019 novel coronavirus  (SARS-CoV-2) nucleic acids may be present in the submitted sample  additional confirmatory testing may be necessary for epidemiological  and / or clinical management purposes  to differentiate between  SARS-CoV-2 and other Sarbecovirus currently known to infect humans.  If clinically indicated additional testing with an alternate test  methodology [phone]) is advised. The SARS-CoV-2 RNA is generally  detectable in upper and lower respiratory sp ecimens during the acute  phase of  infection. The expected result is Negative. Fact Sheet for Patients:  StrictlyIdeas.no Fact Sheet for Healthcare Providers: BankingDealers.co.za This test is not yet approved or cleared by the [place] FDA and has been authorized for detection and/or diagnosis of SARS-CoV-2 by FDA under an Emergency Use Authorization (EUA).  This EUA will remain in effect (meaning this test can be used) for the duration of the COVID-19 declaration under Section 564(b)(1) of the Act, 21 U.S.C. section 360bbb-3(b)(1), unless the authorization is terminated or revoked sooner. Performed at [facility], [address] [place], [place] [address]   Blood culture (routine x 2)     Status: None (Preliminary result)   Collection Time: 07/02/19  2:03 PM   Specimen: BLOOD  Result Value Ref Range Status   Specimen Description BLOOD BLOOD RIGHT HAND  Final   Special Requests   Final    BOTTLES DRAWN AEROBIC AND ANAEROBIC Blood Culture adequate volume   Culture   Final    NO GROWTH 4 DAYS Performed at [facility], [address],  [place], [place] [address]    Report Status PENDING  Incomplete  Blood culture (routine x 2)     Status: None (Preliminary result)   Collection Time: 07/02/19  2:03 PM   Specimen: BLOOD  Result Value Ref Range Status   Specimen Description BLOOD RIGHT ANTECUBITAL  Final   Special Requests   Final    BOTTLES DRAWN AEROBIC AND ANAEROBIC Blood Culture adequate volume   Culture   Final    NO GROWTH 4 DAYS Performed at [facility], [address], [place], [place] [address]    Report Status PENDING  Incomplete  MRSA PCR Screening     Status: None   Collection Time: 07/02/19 10:20 PM   Specimen: Nasal Mucosa; Nasopharyngeal  Result Value Ref Range Status   MRSA by PCR NEGATIVE NEGATIVE Final    Comment:        The GeneXpert MRSA Assay (FDA approved for NASAL specimens only), is one component of  a comprehensive MRSA colonization surveillance program. It is not intended to diagnose MRSA infection nor to guide or monitor treatment for MRSA infections. Performed at [facility], [address] [place], [place] [address]        Today   Subjective:   Keyonne Forsha today has no headache,no chest abdominal pain,no new weakness tingling or numbness, feels much better wants to go home today.   Objective:   Blood pressure 115/78, pulse 78, temperature 97.8 F (36.6 C), temperature source Oral, resp. rate 18, height 5\' 1"  (1.549 m), weight 45.4 kg, SpO2 96 %.   Intake/Output Summary (Last 24 hours) at 07/06/2019 0910 Last data filed at 07/06/2019 0648 Gross per 24 hour  Intake 501.6 ml  Output -  Net 501.6 ml    Exam Awake Alert, Oriented x 3, No new F.N deficits, Normal affect [place].AT,PERRAL Supple Neck,No JVD, No cervical lymphadenopathy appriciated.  Symmetrical Chest wall movement, Good air movement bilaterally, CTAB RRR,No Gallops,Rubs or new Murmurs, No Parasternal Heave +ve B.Sounds, Abd Soft, Non tender, No organomegaly appriciated, No rebound -guarding or rigidity. No Cyanosis, Clubbing or edema, No new Rash or bruise  Data Review   CBC w Diff:  Lab Results  Component Value Date   WBC 9.2 07/04/2019   HGB 9.4 (L) 07/04/2019   HCT 30.2 (L) 07/04/2019   PLT 103 (L) 07/04/2019   LYMPHOPCT 7 05/22/2019   MONOPCT 7 05/22/2019   EOSPCT 1 05/22/2019   BASOPCT 0 05/22/2019    CMP:  Lab Results  Component Value Date   NA 138 07/04/2019   K 3.4 (L) 07/04/2019   CL 98 07/04/2019   CO2 27 07/04/2019   BUN 31 (H) 07/04/2019   CREATININE 4.72 (H) 07/04/2019   PROT 6.7 07/02/2019   ALBUMIN 3.1 (L) 07/02/2019   BILITOT 3.9 (H) 07/02/2019   ALKPHOS 96 07/02/2019   AST 19 07/02/2019   ALT 14 07/02/2019  .   Total Time in preparing paper work, data evaluation and todays exam - 35 minutes  Epifanio Lesches M.D on 07/06/2019 at 9:10 AM     Note: This dictation was prepared with Dragon dictation along with smaller phrase technology. Any transcriptional errors that result from this process are unintentional.

## 2019-07-07 LAB — CULTURE, BLOOD (ROUTINE X 2)
Culture: NO GROWTH
Culture: NO GROWTH
Special Requests: ADEQUATE
Special Requests: ADEQUATE

## 2019-07-09 ENCOUNTER — Other Ambulatory Visit: Payer: Self-pay

## 2019-07-09 ENCOUNTER — Inpatient Hospital Stay
Admission: EM | Admit: 2019-07-09 | Discharge: 2019-07-12 | DRG: 871 | Disposition: A | Discharge status: Home Health | Payer: Medicare Other | Attending: Internal Medicine | Admitting: Internal Medicine

## 2019-07-09 ENCOUNTER — Emergency Department: Payer: Medicare Other

## 2019-07-09 DIAGNOSIS — A419 Sepsis, unspecified organism: Secondary | ICD-10-CM | POA: Diagnosis present

## 2019-07-09 DIAGNOSIS — I248 Other forms of acute ischemic heart disease: Secondary | ICD-10-CM | POA: Diagnosis present

## 2019-07-09 DIAGNOSIS — Y95 Nosocomial condition: Secondary | ICD-10-CM | POA: Diagnosis present

## 2019-07-09 DIAGNOSIS — F17210 Nicotine dependence, cigarettes, uncomplicated: Secondary | ICD-10-CM | POA: Diagnosis present

## 2019-07-09 DIAGNOSIS — N2581 Secondary hyperparathyroidism of renal origin: Secondary | ICD-10-CM | POA: Diagnosis present

## 2019-07-09 DIAGNOSIS — Z7982 Long term (current) use of aspirin: Secondary | ICD-10-CM

## 2019-07-09 DIAGNOSIS — I739 Peripheral vascular disease, unspecified: Secondary | ICD-10-CM | POA: Diagnosis present

## 2019-07-09 DIAGNOSIS — Z882 Allergy status to sulfonamides status: Secondary | ICD-10-CM | POA: Diagnosis not present

## 2019-07-09 DIAGNOSIS — R0902 Hypoxemia: Secondary | ICD-10-CM | POA: Diagnosis present

## 2019-07-09 DIAGNOSIS — Z20828 Contact with and (suspected) exposure to other viral communicable diseases: Secondary | ICD-10-CM | POA: Diagnosis present

## 2019-07-09 DIAGNOSIS — I120 Hypertensive chronic kidney disease with stage 5 chronic kidney disease or end stage renal disease: Secondary | ICD-10-CM | POA: Diagnosis present

## 2019-07-09 DIAGNOSIS — N186 End stage renal disease: Secondary | ICD-10-CM | POA: Diagnosis present

## 2019-07-09 DIAGNOSIS — J9601 Acute respiratory failure with hypoxia: Secondary | ICD-10-CM | POA: Diagnosis present

## 2019-07-09 DIAGNOSIS — J189 Pneumonia, unspecified organism: Secondary | ICD-10-CM | POA: Diagnosis present

## 2019-07-09 DIAGNOSIS — Z832 Family history of diseases of the blood and blood-forming organs and certain disorders involving the immune mechanism: Secondary | ICD-10-CM | POA: Diagnosis not present

## 2019-07-09 DIAGNOSIS — Z885 Allergy status to narcotic agent status: Secondary | ICD-10-CM | POA: Diagnosis not present

## 2019-07-09 DIAGNOSIS — R0602 Shortness of breath: Secondary | ICD-10-CM | POA: Diagnosis present

## 2019-07-09 DIAGNOSIS — D631 Anemia in chronic kidney disease: Secondary | ICD-10-CM | POA: Diagnosis present

## 2019-07-09 DIAGNOSIS — Z7901 Long term (current) use of anticoagulants: Secondary | ICD-10-CM

## 2019-07-09 DIAGNOSIS — Z992 Dependence on renal dialysis: Secondary | ICD-10-CM

## 2019-07-09 DIAGNOSIS — I214 Non-ST elevation (NSTEMI) myocardial infarction: Secondary | ICD-10-CM | POA: Diagnosis present

## 2019-07-09 LAB — COMPREHENSIVE METABOLIC PANEL
ALT: 12 U/L (ref 0–44)
AST: 31 U/L (ref 15–41)
Albumin: 2.6 g/dL — ABNORMAL LOW (ref 3.5–5.0)
Alkaline Phosphatase: 77 U/L (ref 38–126)
Anion gap: 14 (ref 5–15)
BUN: 33 mg/dL — ABNORMAL HIGH (ref 6–20)
CO2: 22 mmol/L (ref 22–32)
Calcium: 9.2 mg/dL (ref 8.9–10.3)
Chloride: 102 mmol/L (ref 98–111)
Creatinine, Ser: 4.44 mg/dL — ABNORMAL HIGH (ref 0.61–1.24)
GFR calc Af Amer: 17 mL/min — ABNORMAL LOW (ref >60)
GFR calc non Af Amer: 15 mL/min — ABNORMAL LOW (ref >60)
Glucose, Bld: 141 mg/dL — ABNORMAL HIGH (ref 70–99)
Potassium: 3.9 mmol/L (ref 3.5–5.1)
Sodium: 138 mmol/L (ref 135–145)
Total Bilirubin: 2.5 mg/dL — ABNORMAL HIGH (ref 0.3–1.2)
Total Protein: 6.7 g/dL (ref 6.5–8.1)

## 2019-07-09 LAB — CBC
HCT: 35.9 % — ABNORMAL LOW (ref 39.0–52.0)
Hemoglobin: 11.4 g/dL — ABNORMAL LOW (ref 13.0–17.0)
MCH: 32.9 pg (ref 26.0–34.0)
MCHC: 31.8 g/dL (ref 30.0–36.0)
MCV: 103.5 fL — ABNORMAL HIGH (ref 80.0–100.0)
Platelets: 185 10*3/uL (ref 150–400)
RBC: 3.47 MIL/uL — ABNORMAL LOW (ref 4.22–5.81)
RDW: 16.8 % — ABNORMAL HIGH (ref 11.5–15.5)
WBC: 15.3 10*3/uL — ABNORMAL HIGH (ref 4.0–10.5)
nRBC: 0 % (ref 0.0–0.2)

## 2019-07-09 LAB — FOLATE: Folate: 11.5 ng/mL (ref >5.9)

## 2019-07-09 LAB — TROPONIN I (HIGH SENSITIVITY)
Troponin I (High Sensitivity): 1159 ng/L (ref <18)
Troponin I (High Sensitivity): 1174 ng/L (ref <18)
Troponin I (High Sensitivity): 1240 ng/L (ref <18)

## 2019-07-09 LAB — HEPARIN LEVEL (UNFRACTIONATED)
Heparin Unfractionated: 0.34 IU/mL (ref 0.30–0.70)
Heparin Unfractionated: 0.25 IU/mL — ABNORMAL LOW (ref 0.30–0.70)

## 2019-07-09 LAB — TSH: TSH: 3.181 u[IU]/mL (ref 0.350–4.500)

## 2019-07-09 LAB — SARS CORONAVIRUS 2 BY RT PCR (HOSPITAL ORDER, PERFORMED IN ~~LOC~~ HOSPITAL LAB): SARS Coronavirus 2: NEGATIVE

## 2019-07-09 LAB — LACTIC ACID, PLASMA: Lactic Acid, Venous: 1.7 mmol/L (ref 0.5–1.9)

## 2019-07-09 LAB — PROTIME-INR
INR: 1.4 — ABNORMAL HIGH (ref 0.8–1.2)
Prothrombin Time: 17.2 seconds — ABNORMAL HIGH (ref 11.4–15.2)

## 2019-07-09 LAB — MRSA PCR SCREENING: MRSA by PCR: NEGATIVE

## 2019-07-09 LAB — APTT
aPTT: 37 seconds — ABNORMAL HIGH (ref 24–36)
aPTT: 43 seconds — ABNORMAL HIGH (ref 24–36)

## 2019-07-09 LAB — PROCALCITONIN: Procalcitonin: 0.39 ng/mL

## 2019-07-09 MED ORDER — VANCOMYCIN HCL IN DEXTROSE 1-5 GM/200ML-% IV SOLN
1000.0000 mg | Freq: Once | INTRAVENOUS | Status: AC
  Administered 2019-07-09: 1000 mg via INTRAVENOUS
  Filled 2019-07-09: qty 200

## 2019-07-09 MED ORDER — HEPARIN BOLUS VIA INFUSION
2500.0000 [IU] | Freq: Once | INTRAVENOUS | Status: AC
  Administered 2019-07-09: 2500 [IU] via INTRAVENOUS
  Filled 2019-07-09: qty 2500

## 2019-07-09 MED ORDER — VANCOMYCIN HCL IN DEXTROSE 500-5 MG/100ML-% IV SOLN
500.0000 mg | INTRAVENOUS | Status: DC
  Filled 2019-07-09: qty 100

## 2019-07-09 MED ORDER — SODIUM CHLORIDE 0.9 % IV SOLN
2.0000 g | INTRAVENOUS | Status: DC
  Administered 2019-07-09: 22:00:00 2 g via INTRAVENOUS
  Filled 2019-07-09: qty 2

## 2019-07-09 MED ORDER — CARVEDILOL 25 MG PO TABS
25.0000 mg | ORAL_TABLET | Freq: Two times a day (BID) | ORAL | Status: DC
  Administered 2019-07-09 – 2019-07-12 (×4): 25 mg via ORAL
  Filled 2019-07-09 (×4): qty 1

## 2019-07-09 MED ORDER — ACETAMINOPHEN 325 MG PO TABS
650.0000 mg | ORAL_TABLET | Freq: Four times a day (QID) | ORAL | Status: DC | PRN
  Administered 2019-07-10: 01:00:00 650 mg via ORAL
  Filled 2019-07-09 (×3): qty 2

## 2019-07-09 MED ORDER — ORAL CARE MOUTH RINSE
15.0000 mL | Freq: Two times a day (BID) | OROMUCOSAL | Status: DC
  Administered 2019-07-09 – 2019-07-12 (×4): 15 mL via OROMUCOSAL

## 2019-07-09 MED ORDER — DOCUSATE SODIUM 100 MG PO CAPS
100.0000 mg | ORAL_CAPSULE | Freq: Two times a day (BID) | ORAL | Status: DC
  Administered 2019-07-09 – 2019-07-11 (×5): 100 mg via ORAL
  Filled 2019-07-09 (×6): qty 1

## 2019-07-09 MED ORDER — MORPHINE SULFATE (PF) 4 MG/ML IV SOLN
4.0000 mg | Freq: Once | INTRAVENOUS | Status: AC
  Administered 2019-07-09: 03:00:00 4 mg via INTRAVENOUS
  Filled 2019-07-09: qty 1

## 2019-07-09 MED ORDER — HYDRALAZINE HCL 25 MG PO TABS
25.0000 mg | ORAL_TABLET | Freq: Three times a day (TID) | ORAL | Status: DC
  Administered 2019-07-09 – 2019-07-12 (×7): 25 mg via ORAL
  Filled 2019-07-09 (×7): qty 1

## 2019-07-09 MED ORDER — ONDANSETRON HCL 4 MG/2ML IJ SOLN
4.0000 mg | Freq: Four times a day (QID) | INTRAMUSCULAR | Status: DC | PRN
  Administered 2019-07-11: 4 mg via INTRAVENOUS
  Filled 2019-07-09: qty 2

## 2019-07-09 MED ORDER — VANCOMYCIN HCL 500 MG IV SOLR
500.0000 mg | INTRAVENOUS | Status: DC
  Filled 2019-07-09: qty 500

## 2019-07-09 MED ORDER — LABETALOL HCL 5 MG/ML IV SOLN
10.0000 mg | Freq: Once | INTRAVENOUS | Status: AC
  Administered 2019-07-09: 10 mg via INTRAVENOUS
  Filled 2019-07-09: qty 4

## 2019-07-09 MED ORDER — ONDANSETRON HCL 4 MG PO TABS: 4.0000 mg | ORAL_TABLET | Freq: Four times a day (QID) | ORAL | Status: DC | PRN

## 2019-07-09 MED ORDER — ONDANSETRON HCL 4 MG/2ML IJ SOLN
4.0000 mg | Freq: Once | INTRAMUSCULAR | Status: AC
  Administered 2019-07-09: 4 mg via INTRAVENOUS
  Filled 2019-07-09: qty 2

## 2019-07-09 MED ORDER — HEPARIN (PORCINE) 25000 UT/250ML-% IV SOLN
850.0000 [IU]/h | INTRAVENOUS | Status: DC
  Administered 2019-07-09: 550 [IU]/h via INTRAVENOUS
  Filled 2019-07-09: qty 250

## 2019-07-09 MED ORDER — CHLORHEXIDINE GLUCONATE CLOTH 2 % EX PADS
6.0000 | MEDICATED_PAD | Freq: Every day | CUTANEOUS | Status: DC
  Administered 2019-07-10: 06:00:00 6 via TOPICAL

## 2019-07-09 MED ORDER — SEVELAMER CARBONATE 800 MG PO TABS
1600.0000 mg | ORAL_TABLET | Freq: Three times a day (TID) | ORAL | Status: DC
  Administered 2019-07-09 (×2): 1600 mg via ORAL
  Filled 2019-07-09 (×5): qty 2

## 2019-07-09 MED ORDER — ACETAMINOPHEN 650 MG RE SUPP: 650.0000 mg | Freq: Four times a day (QID) | RECTAL | Status: DC | PRN

## 2019-07-09 MED ORDER — ASPIRIN 81 MG PO CHEW
81.0000 mg | CHEWABLE_TABLET | ORAL | Status: AC
  Administered 2019-07-10: 81 mg via ORAL
  Filled 2019-07-09: qty 1

## 2019-07-09 MED ORDER — HYDRALAZINE HCL 20 MG/ML IJ SOLN: 10.0000 mg | Freq: Once | INTRAMUSCULAR | Status: DC

## 2019-07-09 MED ORDER — SODIUM CHLORIDE 0.9 % IV SOLN
2.0000 g | Freq: Once | INTRAVENOUS | Status: AC
  Administered 2019-07-09: 2 g via INTRAVENOUS
  Filled 2019-07-09: qty 2

## 2019-07-09 MED ORDER — AMLODIPINE BESYLATE 10 MG PO TABS
10.0000 mg | ORAL_TABLET | Freq: Every day | ORAL | Status: DC
  Administered 2019-07-09 – 2019-07-11 (×3): 10 mg via ORAL
  Filled 2019-07-09 (×3): qty 1

## 2019-07-09 MED ORDER — NITROGLYCERIN 2 % TD OINT
1.0000 [in_us] | TOPICAL_OINTMENT | Freq: Once | TRANSDERMAL | Status: AC
  Administered 2019-07-09: 02:00:00 1 [in_us] via TOPICAL
  Filled 2019-07-09: qty 1

## 2019-07-09 MED ORDER — HEPARIN BOLUS VIA INFUSION
700.0000 [IU] | Freq: Once | INTRAVENOUS | Status: AC
  Administered 2019-07-09: 700 [IU] via INTRAVENOUS
  Filled 2019-07-09: qty 700

## 2019-07-09 NOTE — Progress Notes (Signed)
ANTICOAGULATION CONSULT NOTE - Initial Consult  Pharmacy Consult for HEPARIN Indication: chest pain/ACS  Allergies  Allergen Reactions  . Codeine Nausea Only    Patient questioned this (??)  . Sulfa Antibiotics Hives and Nausea And Vomiting    Patient Measurements: Height: 5\' 1"  (154.9 cm) Weight: 116 lb 2.9 oz (52.7 kg) IBW/kg (Calculated) : 52.3 HEPARIN DW (KG): 47  Vital Signs: Temp: 98.2 F (36.8 C) (09/17 1958) Temp Source: Oral (09/17 1958) BP: 126/90 (09/17 2130) Pulse Rate: 96 (09/17 2130)  Labs: Recent Labs    07/09/19 0135 07/09/19 0247 07/09/19 0441 07/09/19 1238 07/09/19 1350 07/09/19 2208  HGB 11.4*  --   --   --   --   --   HCT 35.9*  --   --   --   --   --   PLT 185  --   --   --   --   --   APTT  --   --  37* 43*  --   --   LABPROT  --   --  17.2*  --   --   --   INR  --   --  1.4*  --   --   --   HEPARINUNFRC  --   --   --  0.34  --  0.25*  CREATININE  --  4.44*  --   --   --   --   TROPONINIHS  --  1,159* 1,174*  --  1,240*  --    Estimated Creatinine Clearance: 15.5 mL/min (A) (by C-G formula based on SCr of 4.44 mg/dL (H)).  Medical History: Past Medical History:  Diagnosis Date  . ESRD (end stage renal disease) ([place])   . Hypertension   . Renal disorder   . Secondary hyperparathyroidism of renal origin [facility])    Medications:  Medications Prior to Admission  Medication Sig Dispense Refill Last Dose  . amLODipine (NORVASC) 10 MG tablet Take 1 tablet (10 mg total) by mouth daily at 8 pm. 30 tablet 2 Past Week at Unknown  . amoxicillin-clavulanate (AUGMENTIN) 875-125 MG tablet Take 1 tablet by mouth 2 (two) times daily for 7 days. 14 tablet 0 07/08/2019 at 1100  . apixaban (ELIQUIS) 2.5 MG TABS tablet Take 1 tablet (2.5 mg total) by mouth 2 (two) times daily. 60 tablet 1 07/08/2019 at 1100  . aspirin EC 81 MG tablet Take 1 tablet (81 mg total) by mouth daily. 30 tablet 1 07/08/2019 at 1100  . atorvastatin (LIPITOR) 80 MG tablet Take 1 tablet  (80 mg total) by mouth at bedtime. 30 tablet 1 Past Week at Unknown  . azithromycin (ZITHROMAX Z-PAK) 250 MG tablet Take 2 tablets (500 mg) on  Day 1,  followed by 1 tablet (250 mg) once daily on Days 2 through 5. 6 each 0 07/08/2019 at 1100  . b complex-C-folic acid 1 MG capsule Take 1 capsule by mouth. With supper.   Past Week at Unknown  . carvedilol (COREG) 25 MG tablet Take 1 tablet (25 mg total) by mouth 2 (two) times daily with a meal. 60 tablet 1 07/08/2019 at 1100  . gabapentin (NEURONTIN) 100 MG capsule Take 1 capsule (100 mg total) by mouth 3 (three) times daily. 90 capsule 1 07/08/2019 at 1100  . hydrALAZINE (APRESOLINE) 25 MG tablet Take 1 tablet (25 mg total) by mouth every 8 (eight) hours. 90 tablet 1 07/08/2019 at 1100  . hydrOXYzine (ATARAX/VISTARIL) 25 MG tablet Take 25  mg by mouth 3 (three) times daily as needed for anxiety.   prn at prn  . loratadine (CLARITIN) 10 MG tablet Take 1 tablet (10 mg total) by mouth daily. 30 tablet 0 07/08/2019 at 1100  . multivitamin (RENA-VIT) TABS tablet Take 1 tablet by mouth daily. 30 each 1 07/08/2019 at 1100  . neomycin-bacitracin-polymyxin (NEOSPORIN) OINT Apply 1 application topically 2 (two) times daily. 56 g 1 Unknown at Unknown  . sevelamer carbonate (RENVELA) 800 MG tablet Take 2 tablets (1,600 mg total) by mouth 3 (three) times daily with meals. 180 tablet 1 07/08/2019 at 1100  . traMADol (ULTRAM) 50 MG tablet Take 1 tablet (50 mg total) by mouth every 12 (twelve) hours as needed for moderate pain or severe pain. 8 tablet 0 prn at prn  . Nutritional Supplements (FEEDING SUPPLEMENT, NEPRO CARB STEADY,) LIQD Take 237 mLs by mouth 2 (two) times daily between meals. 237 mL 30    Assessment: Pharmacy asked to initiate Heparin for ACS.  Baseline labs ordered, pending.  Pt was on apixaban 2.5mg  BID per PTA med list but last dose taken not recorded.  No baseline Heparin level was ordered. Heparin level (9/17 @1238 ) does not seem to be falsely elevated.  We will monitor heparin with anti-xa levels.   Heparin 2550 units bolus followed by 550 units/hr.  9/17 1238 HL 0.34 aPTT 43. Increased to 650 units/hr   Goal of Therapy:  Heparin level 0.3-0.7 units/ml Monitor platelets by anticoagulation protocol: Yes   Plan:  Heparin level is therapeutic but aPTT is subtherapeutic. Because both levels are not exactly correlating and heparin level is on the low range of therapeutic, I will increase the infusion rate to 650 units/hr Check HL in 8 hours and CBC daily.  Monitor for s/sx of bleeding complications  0000000:  HL @ 2200 = 0.25 Will order Heparin 700 units IV X 1 bolus ordered and increase heparin drip rate to 750 units/hr.  Will recheck HL 6 hrs after rate change.   Itzamara Casas D, PharmD 07/09/2019,10:57 PM

## 2019-07-09 NOTE — ED Provider Notes (Signed)
[facility] Emergency Department Provider Note  Time seen: 1:35 AM  I have reviewed the triage vital signs and the nursing notes.   HISTORY  Chief Complaint Shortness of Breath  HPI Brett Small is a 43 y.o. male with a past medical history of end-stage renal disease on hemodialysis Tuesday/Thursday/Saturday, hypertension, presents to the emergency department for difficulty breathing.  According starting this evening he developed acute onset of shortness of breath and called EMS.  EMS states patient had a room air saturation in the 80s upon arrival was placed nonrebreather and brought to the emergency department.  Upon arrival patient is awake and alert, sitting up in bed, minimal respiratory distress.  Patient is able to speak in full sentences.  States mild posterior chest tightness.  Denies any fever.  States he was diagnosed with pneumonia approximately 1 week ago.  Past Medical History:  Diagnosis Date  . ESRD (end stage renal disease) ([place])   . Hypertension   . Renal disorder   . Secondary hyperparathyroidism of renal origin [facility])     Patient Active Problem List   Diagnosis Date Noted  . Pneumonia 07/02/2019  . Acute liver failure 05/19/2019  . Hematemesis 05/19/2019  . Hepatitis   . Thrombocytopenia ([place])   . Coagulopathy ([place])   . Sepsis ([place]) 02/28/2019  . Lobar pneumonia ([place]) 02/28/2019  . Acute respiratory failure ([place]) 02/04/2019  . Acute respiratory failure with hypoxemia ([place]) 01/27/2019  . Depression 01/07/2019  . MDD (major depressive disorder), single episode, severe , no psychosis ([place])   . Homelessness   . Acute respiratory failure with hypoxia ([place]) 12/25/2018  . End stage renal disease on dialysis ([place]) 12/25/2018  . Hypertension 12/25/2018  . Renal osteodystrophy 12/25/2018    Past Surgical History:  Procedure Laterality Date  . A/V SHUNT INTERVENTION Left 01/19/2019   Procedure: LEFT UPPER EXTREMITY A/V SHUNTOGRAM / UPPER  EXTREMITY ANGIOGRAM;  Surgeon: Algernon Huxley, MD;  Location: [place] CV LAB;  Service: Cardiovascular;  Laterality: Left;  . A/V SHUNT INTERVENTION Left 03/02/2019   Procedure: A/V SHUNT INTERVENTION;  Surgeon: Algernon Huxley, MD;  Location: [place] CV LAB;  Service: Cardiovascular;  Laterality: Left;  . AORTA - FEMORAL ARTERY BYPASS GRAFT    . AV FISTULA PLACEMENT Left 12/26/2018   Procedure: INSERTION OF GORE STRETCH VASCULAR 4-7MM X  45CM IN LEFT UPPER ARM;  Surgeon: Marty Heck, MD;  Location: [place];  Service: Vascular;  Laterality: Left;  . DIALYSIS/PERMA CATHETER REMOVAL N/A 02/06/2019   Procedure: DIALYSIS/PERMA CATHETER REMOVAL;  Surgeon: Algernon Huxley, MD;  Location: [place] CV [facility];  Service: Cardiovascular;  Laterality: N/A;    Prior to Admission medications   Medication Sig Start Date End Date Taking? Authorizing Provider  amLODipine (NORVASC) 10 MG tablet Take 1 tablet (10 mg total) by mouth daily at 8 pm. 02/24/19   Gladstone Lighter, MD  amoxicillin-clavulanate (AUGMENTIN) 875-125 MG tablet Take 1 tablet by mouth 2 (two) times daily for 7 days. 07/06/19 07/13/19  Epifanio Lesches, MD  apixaban (ELIQUIS) 2.5 MG TABS tablet Take 1 tablet (2.5 mg total) by mouth 2 (two) times daily. 01/26/19   Clapacs, Madie Reno, MD  aspirin EC 81 MG tablet Take 1 tablet (81 mg total) by mouth daily. 01/26/19   Clapacs, Madie Reno, MD  atorvastatin (LIPITOR) 80 MG tablet Take 1 tablet (80 mg total) by mouth at bedtime. 01/26/19   Clapacs, Madie Reno, MD  azithromycin (ZITHROMAX Z-PAK) 250  MG tablet Take 2 tablets (500 mg) on  Day 1,  followed by 1 tablet (250 mg) once daily on Days 2 through 5. 07/06/19 07/11/19  Epifanio Lesches, MD  b complex-C-folic acid 1 MG capsule Take 1 capsule by mouth. With supper. 09/18/18   [provider]  carvedilol (COREG) 25 MG tablet Take 1 tablet (25 mg total) by mouth 2 (two) times daily with a meal. 01/26/19   Clapacs, Madie Reno, MD  gabapentin (NEURONTIN)  100 MG capsule Take 1 capsule (100 mg total) by mouth 3 (three) times daily. 01/26/19   Clapacs, Madie Reno, MD  hydrALAZINE (APRESOLINE) 25 MG tablet Take 1 tablet (25 mg total) by mouth every 8 (eight) hours. 01/26/19   Clapacs, Madie Reno, MD  hydrOXYzine (ATARAX/VISTARIL) 25 MG tablet Take 25 mg by mouth 3 (three) times daily as needed for anxiety.    [provider]  loratadine (CLARITIN) 10 MG tablet Take 1 tablet (10 mg total) by mouth daily. 03/04/19   Salary, Holly Bodily D, MD  multivitamin (RENA-VIT) TABS tablet Take 1 tablet by mouth daily. 01/27/19   Clapacs, Madie Reno, MD  neomycin-bacitracin-polymyxin (NEOSPORIN) OINT Apply 1 application topically 2 (two) times daily. 01/26/19   Clapacs, Madie Reno, MD  Nutritional Supplements (FEEDING SUPPLEMENT, NEPRO CARB STEADY,) LIQD Take 237 mLs by mouth 2 (two) times daily between meals. 07/06/19   Epifanio Lesches, MD  sevelamer carbonate (RENVELA) 800 MG tablet Take 2 tablets (1,600 mg total) by mouth 3 (three) times daily with meals. 01/26/19   Clapacs, Madie Reno, MD  traMADol (ULTRAM) 50 MG tablet Take 1 tablet (50 mg total) by mouth every 12 (twelve) hours as needed for moderate pain or severe pain. 03/03/19   Salary, Avel Peace, MD    Allergies  Allergen Reactions  . Codeine Nausea Only    Patient questioned this (??)  . Sulfa Antibiotics Hives and Nausea And Vomiting    Family History  Problem Relation Age of Onset  . Hypertension Other   . Diabetes Other   . Clotting disorder Father     Social History Social History   Tobacco Use  . Smoking status: Current Every Day Smoker    Packs/day: 0.50    Years: 30.00    Pack years: 15.00    Types: Cigarettes    Last attempt to quit: 06/26/2018    Years since quitting: 1.0  . Smokeless tobacco: Never Used  . Tobacco comment: smoked for 30 years   Substance Use Topics  . Alcohol use: Not Currently  . Drug use: Yes    Frequency: 1.0 times per week    Types: Marijuana    Comment: Pt states "maybe  once or twice a week".     Review of Systems Constitutional: Negative for fever. Cardiovascular: Mild chest tightness Respiratory: Positive for shortness of breath Gastrointestinal: Negative for abdominal pain Musculoskeletal: Negative for musculoskeletal complaints Skin: Negative for skin complaints  Neurological: Negative for headache All other ROS negative  ____________________________________________   PHYSICAL EXAM:  VITAL SIGNS: ED Triage Vitals  Enc Vitals Group     BP --      Pulse --      Resp --      Temp --      Temp src --      SpO2 --      Weight 07/09/19 0133 100 lb 1.4 oz (45.4 kg)     Height 07/09/19 0133 5' (1.524 m)     Head Circumference --  Peak Flow --      Pain Score 07/09/19 0132 0     Pain Loc --      Pain Edu? --      Excl. in [place]? --    Constitutional: Alert and oriented. Well appearing and in no distress. Eyes: Normal exam ENT      Head: Normocephalic and atraumatic.      Mouth/Throat: Mucous membranes are moist. Cardiovascular: Normal rate, regular rhythm.  Respiratory: Somewhat diminished breath sounds in the right lung fields.  Moderate tachypnea.  No obvious wheeze, rales, or rhonchi.  Right subclavian dialysis catheter. Gastrointestinal: Soft and nontender. No distention.   Musculoskeletal: Nontender with normal range of motion in all extremities. No lower extremity tenderness or edema. Neurologic:  Normal speech and language. No gross focal neurologic deficits  Skin:  Skin is warm, dry and intact.  Psychiatric: Mood and affect are normal.   ____________________________________________    EKG  EKG viewed and interpreted by myself shows sinus tachycardia 131 bpm with a narrow QRS, normal axis, normal intervals, nonspecific ST changes.  No ST elevation.  ____________________________________________    RADIOLOGY  Chest x-ray shows multilobar right-sided pneumonia.  ____________________________________________   INITIAL  IMPRESSION / ASSESSMENT AND PLAN / ED COURSE  Pertinent labs & imaging results that were available during my care of the patient were reviewed by me and considered in my medical decision making (see chart for details).   Patient presents to the emergency department for shortness of breath.  Has a history of end-stage renal disease on hemodialysis.  Differential would include pulmonary edema, fluid overload, pneumonia, pneumothorax, ACS.  We will check labs, chest x-ray.  Patient is hypertensive currently around 170/120 per EMS.  We will dose IV labetalol, topical nitroglycerin ointment and continue to closely monitor while awaiting x-ray and lab results.  X-ray shows multilobar right-sided pneumonia.  Patient has an elevated white blood cell count of 15,000 along with a low-grade temperature 99.6.  We will cover with IV antibiotics.  Patient's troponin is also resulted significantly elevated.  Patient is on dialysis however prior troponins have been in the 30-50 range today is greater than 1000.  We will start the patient on heparin.  Currently attempting to consult cardiology for further recommendations.  We will admit to the hospitalist service for further treatment.  Patient states he is feeling much better and able to breathe much easier.  Rayvon Curley was evaluated in Emergency Department on 07/09/2019 for the symptoms described in the history of present illness. He was evaluated in the context of the global COVID-19 pandemic, which necessitated consideration that the patient might be at risk for infection with the SARS-CoV-2 virus that causes COVID-19. Institutional protocols and algorithms that pertain to the evaluation of patients at risk for COVID-19 are in a state of rapid change based on information released by regulatory bodies including the CDC and federal and state organizations. These policies and algorithms were followed during the patient's care in the ED.  CRITICAL CARE Performed by:  Harvest Dark   Total critical care time: 30 minutes  Critical care time was exclusive of separately billable procedures and treating other patients.  Critical care was necessary to treat or prevent imminent or life-threatening deterioration.  Critical care was time spent personally by me on the following activities: development of treatment plan with patient and/or surrogate as well as nursing, discussions with consultants, evaluation of patient's response to treatment, examination of patient, obtaining history from patient or surrogate,  ordering and performing treatments and interventions, ordering and review of laboratory studies, ordering and review of radiographic studies, pulse oximetry and re-evaluation of patient's condition.   ____________________________________________   FINAL CLINICAL IMPRESSION(S) / ED DIAGNOSES  Dyspnea Hypoxia NSTEMI HCAP    Harvest Dark, MD 07/09/19 0400

## 2019-07-09 NOTE — H&P (Signed)
Brett Small is an 43 y.o. male.   Chief Complaint: Shortness of breath HPI: The patient with past medical history of hypertension and end-stage renal disease on dialysis presents to the emergency department due to shortness of breath.  The patient was diagnosed with pneumonia and discharged from the hospital 3 days ago.  However he states that he was unable to breathe comfortably at home and had back pain that was unrelieved by position or blood pressure medication.  EMS reported oxygen saturations of 80% on room air.  He was placed on nonrebreather.  Chest x-ray showed multilobar right-sided pneumonia versus asymmetrical edema.  Also, the patient troponin was elevated prompting concern for non-ST elevation myocardial infarction.  The patient was started on broad-spectrum antibiotics and heparin drip prior to the emergency department staff calling the hospitalist service for admission.  Past Medical History:  Diagnosis Date  . ESRD (end stage renal disease) ([place])   . Hypertension   . Renal disorder   . Secondary hyperparathyroidism of renal origin [facility])     Past Surgical History:  Procedure Laterality Date  . A/V SHUNT INTERVENTION Left 01/19/2019   Procedure: LEFT UPPER EXTREMITY A/V SHUNTOGRAM / UPPER EXTREMITY ANGIOGRAM;  Surgeon: Algernon Huxley, MD;  Location: [place] [facility];  Service: Cardiovascular;  Laterality: Left;  . A/V SHUNT INTERVENTION Left 03/02/2019   Procedure: A/V SHUNT INTERVENTION;  Surgeon: Algernon Huxley, MD;  Location: [place] [facility];  Service: Cardiovascular;  Laterality: Left;  . AORTA - FEMORAL ARTERY BYPASS GRAFT    . AV FISTULA PLACEMENT Left 12/26/2018   Procedure: INSERTION OF GORE STRETCH VASCULAR 4-7MM X  45CM IN LEFT UPPER ARM;  Surgeon: Marty Heck, MD;  Location: [place];  Service: Vascular;  Laterality: Left;  . DIALYSIS/PERMA CATHETER REMOVAL N/A 02/06/2019   Procedure: DIALYSIS/PERMA CATHETER REMOVAL;  Surgeon: Algernon Huxley, MD;  Location: [place] [facility];  Service: Cardiovascular;  Laterality: N/A;    Family History  Problem Relation Age of Onset  . Hypertension Other   . Diabetes Other   . Clotting disorder Father    Social History:  reports that he has been smoking cigarettes. He has a 15.00 pack-year smoking history. He has never used smokeless tobacco. He reports previous alcohol use. He reports current drug use. Frequency: 1.00 time per week. Drug: Marijuana.  Allergies:  Allergies  Allergen Reactions  . Codeine Nausea Only    Patient questioned this (??)  . Sulfa Antibiotics Hives and Nausea And Vomiting    Medications Prior to Admission  Medication Sig Dispense Refill  . amLODipine (NORVASC) 10 MG tablet Take 1 tablet (10 mg total) by mouth daily at 8 pm. 30 tablet 2  . amoxicillin-clavulanate (AUGMENTIN) 875-125 MG tablet Take 1 tablet by mouth 2 (two) times daily for 7 days. 14 tablet 0  . apixaban (ELIQUIS) 2.5 MG TABS tablet Take 1 tablet (2.5 mg total) by mouth 2 (two) times daily. 60 tablet 1  . aspirin EC 81 MG tablet Take 1 tablet (81 mg total) by mouth daily. 30 tablet 1  . atorvastatin (LIPITOR) 80 MG tablet Take 1 tablet (80 mg total) by mouth at bedtime. 30 tablet 1  . azithromycin (ZITHROMAX Z-PAK) 250 MG tablet Take 2 tablets (500 mg) on  Day 1,  followed by 1 tablet (250 mg) once daily on Days 2 through 5. 6 each 0  . b complex-C-folic acid 1 MG capsule Take 1 capsule by mouth. With supper.    Mar[place] Kitchen  carvedilol (COREG) 25 MG tablet Take 1 tablet (25 mg total) by mouth 2 (two) times daily with a meal. 60 tablet 1  . gabapentin (NEURONTIN) 100 MG capsule Take 1 capsule (100 mg total) by mouth 3 (three) times daily. 90 capsule 1  . hydrALAZINE (APRESOLINE) 25 MG tablet Take 1 tablet (25 mg total) by mouth every 8 (eight) hours. 90 tablet 1  . hydrOXYzine (ATARAX/VISTARIL) 25 MG tablet Take 25 mg by mouth 3 (three) times daily as needed for anxiety.    Marland Kitchen loratadine (CLARITIN) 10 MG tablet Take 1 tablet (10  mg total) by mouth daily. 30 tablet 0  . multivitamin (RENA-VIT) TABS tablet Take 1 tablet by mouth daily. 30 each 1  . neomycin-bacitracin-polymyxin (NEOSPORIN) OINT Apply 1 application topically 2 (two) times daily. 56 g 1  . sevelamer carbonate (RENVELA) 800 MG tablet Take 2 tablets (1,600 mg total) by mouth 3 (three) times daily with meals. 180 tablet 1  . traMADol (ULTRAM) 50 MG tablet Take 1 tablet (50 mg total) by mouth every 12 (twelve) hours as needed for moderate pain or severe pain. 8 tablet 0  . Nutritional Supplements (FEEDING SUPPLEMENT, NEPRO CARB STEADY,) LIQD Take 237 mLs by mouth 2 (two) times daily between meals. 237 mL 30    Results for orders placed or performed during the hospital encounter of 07/09/19 (from the past 48 hour(s))  CBC     Status: Abnormal   Collection Time: 07/09/19  1:35 AM  Result Value Ref Range   WBC 15.3 (H) 4.0 - 10.5 K/uL   RBC 3.47 (L) 4.22 - 5.81 MIL/uL   Hemoglobin 11.4 (L) 13.0 - 17.0 g/dL   HCT 35.9 (L) 39.0 - 52.0 %   MCV 103.5 (H) 80.0 - 100.0 fL   MCH 32.9 26.0 - 34.0 pg   MCHC 31.8 30.0 - 36.0 g/dL   RDW 16.8 (H) 11.5 - 15.5 %   Platelets 185 150 - 400 K/uL   nRBC 0.0 0.0 - 0.2 %    Comment: Performed at [facility], [address] [place], [place] [address]  SARS Coronavirus 2 [facility] order, Performed in [facility] hospital lab) Nasopharyngeal Nasopharyngeal Swab     Status: None   Collection Time: 07/09/19  2:16 AM   Specimen: Nasopharyngeal Swab  Result Value Ref Range   SARS Coronavirus 2 NEGATIVE NEGATIVE    Comment: (NOTE) If result is NEGATIVE SARS-CoV-2 target nucleic acids are NOT DETECTED. The SARS-CoV-2 RNA is generally detectable in upper and lower  respiratory specimens during the acute phase of infection. The lowest  concentration of SARS-CoV-2 viral copies this assay can detect is 250  copies / mL. A negative result does not preclude SARS-CoV-2 infection  and should not be used as the sole basis for  treatment or other  patient management decisions.  A negative result may occur with  improper specimen collection / handling, submission of specimen other  than nasopharyngeal swab, presence of viral mutation(s) within the  areas targeted by this assay, and inadequate number of viral copies  (<250 copies / mL). A negative result must be combined with clinical  observations, patient history, and epidemiological information. If result is POSITIVE SARS-CoV-2 target nucleic acids are DETECTED. The SARS-CoV-2 RNA is generally detectable in upper and lower  respiratory specimens dur ing the acute phase of infection.  Positive  results are indicative of active infection with SARS-CoV-2.  Clinical  correlation with patient history and other diagnostic information is  necessary to determine patient infection status.  Positive results do  not rule out bacterial infection or co-infection with other viruses. If result is PRESUMPTIVE POSTIVE SARS-CoV-2 nucleic acids MAY BE PRESENT.   A presumptive positive result was obtained on the submitted specimen  and confirmed on repeat testing.  While 2019 novel coronavirus  (SARS-CoV-2) nucleic acids may be present in the submitted sample  additional confirmatory testing may be necessary for epidemiological  and / or clinical management purposes  to differentiate between  SARS-CoV-2 and other Sarbecovirus currently known to infect humans.  If clinically indicated additional testing with an alternate test  methodology [phone]) is advised. The SARS-CoV-2 RNA is generally  detectable in upper and lower respiratory sp ecimens during the acute  phase of infection. The expected result is Negative. Fact Sheet for Patients:  StrictlyIdeas.no Fact Sheet for Healthcare Providers: BankingDealers.co.za This test is not yet approved or cleared by the [place] FDA and has been authorized for detection and/or  diagnosis of SARS-CoV-2 by FDA under an Emergency Use Authorization (EUA).  This EUA will remain in effect (meaning this test can be used) for the duration of the COVID-19 declaration under Section 564(b)(1) of the Act, 21 U.S.C. section 360bbb-3(b)(1), unless the authorization is terminated or revoked sooner. Performed at [facility], [address] [place], [place] [address]   Comprehensive metabolic panel     Status: Abnormal   Collection Time: 07/09/19  2:47 AM  Result Value Ref Range   Sodium 138 135 - 145 mmol/L   Potassium 3.9 3.5 - 5.1 mmol/L   Chloride 102 98 - 111 mmol/L   CO2 22 22 - 32 mmol/L   Glucose, Bld 141 (H) 70 - 99 mg/dL   BUN 33 (H) 6 - 20 mg/dL   Creatinine, Ser 4.44 (H) 0.61 - 1.24 mg/dL   Calcium 9.2 8.9 - 10.3 mg/dL   Total Protein 6.7 6.5 - 8.1 g/dL   Albumin 2.6 (L) 3.5 - 5.0 g/dL   AST 31 15 - 41 U/L   ALT 12 0 - 44 U/L   Alkaline Phosphatase 77 38 - 126 U/L   Total Bilirubin 2.5 (H) 0.3 - 1.2 mg/dL   GFR calc non Af Amer 15 (L) >60 mL/min   GFR calc Af Amer 17 (L) >60 mL/min   Anion gap 14 5 - 15    Comment: Performed at [facility], [address] [place], [place] [address]  Troponin I (High Sensitivity)     Status: Abnormal   Collection Time: 07/09/19  2:47 AM  Result Value Ref Range   Troponin I (High Sensitivity) 1,159 (HH) <18 ng/L    Comment: CRITICAL RESULT CALLED TO, READ BACK BY AND VERIFIED WITH DEE MCCLAIN ON 07/09/19 AT 0328 [facility] (NOTE) Elevated high sensitivity troponin I (hsTnI) values and significant  changes across serial measurements may suggest ACS but many other  chronic and acute conditions are known to elevate hsTnI results.  Refer to the "Links" section for chest pain algorithms and additional  guidance. Performed at [facility], [address] [place], [place] [address]   Lactic acid, plasma     Status: None   Collection Time: 07/09/19  2:47 AM  Result Value Ref Range   Lactic Acid,  Venous 1.7 0.5 - 1.9 mmol/L    Comment: Performed at [facility], [address] [place], [place] [address]  Troponin I (High Sensitivity)     Status: Abnormal   Collection  Time: 07/09/19  4:41 AM  Result Value Ref Range   Troponin I (High Sensitivity) 1,174 (HH) <18 ng/L    Comment: CRITICAL VALUE NOTED. VALUE IS CONSISTENT WITH PREVIOUSLY REPORTED/CALLED VALUE [place] (NOTE) Elevated high sensitivity troponin I (hsTnI) values and significant  changes across serial measurements may suggest ACS but many other  chronic and acute conditions are known to elevate hsTnI results.  Refer to the "Links" section for chest pain algorithms and additional  guidance. Performed at [facility], [address] [place], [place] [address]   APTT     Status: Abnormal   Collection Time: 07/09/19  4:41 AM  Result Value Ref Range   aPTT 37 (H) 24 - 36 seconds    Comment:        IF BASELINE aPTT IS ELEVATED, SUGGEST PATIENT RISK ASSESSMENT BE USED TO DETERMINE APPROPRIATE ANTICOAGULANT THERAPY. Performed at [facility], [address] [place], [place] [address]   Protime-INR     Status: Abnormal   Collection Time: 07/09/19  4:41 AM  Result Value Ref Range   Prothrombin Time 17.2 (H) 11.4 - 15.2 seconds   INR 1.4 (H) 0.8 - 1.2    Comment: (NOTE) INR goal varies based on device and disease states. Performed at [facility], [address] [place], [place] [address]    Dg Chest Portable 1 View  Result Date: 07/09/2019 CLINICAL DATA:  43 year old male with shortness of breath. EXAM: PORTABLE CHEST 1 VIEW COMPARISON:  Portable chest 07/02/2019 and earlier. FINDINGS: Portable AP upright view at 0131 hours. Stable cardiomegaly and mediastinal contours. Stable lung volumes. Stable right chest dialysis type catheter. New confluent but indistinct multilobar right lung opacity, similar left perihilar lower lung opacity. No pneumothorax. Left upper lobe pulmonary  vascularity appears normal. Visualized tracheal air column is within normal limits. Left upper extremity vascular graft again noted. Stable blunting of the left costophrenic angle. Negative visible bowel gas pattern. IMPRESSION: 1. Acute confluent multilobar right lung opacity, lesser left perihilar opacity. Favor acute bilateral pneumonia over asymmetric pulmonary edema. 2. Underlying cardiomegaly, right chest dialysis type catheter. Electronically Signed   By: Genevie Ann M.D.   On: 07/09/2019 02:11    Review of Systems  Constitutional: Negative for chills and fever.  HENT: Negative for sore throat and tinnitus.   Eyes: Negative for blurred vision and redness.  Respiratory: Positive for shortness of breath. Negative for cough.   Cardiovascular: Negative for chest pain, palpitations, orthopnea and PND.  Gastrointestinal: Negative for abdominal pain, diarrhea, nausea and vomiting.  Genitourinary: Negative for dysuria, frequency and urgency.  Musculoskeletal: Positive for back pain. Negative for joint pain and myalgias.  Skin: Negative for rash.       No lesions  Neurological: Negative for speech change, focal weakness and weakness.  Endo/Heme/Allergies: Does not bruise/bleed easily.       No temperature intolerance  Psychiatric/Behavioral: Negative for depression and suicidal ideas.    Blood pressure (!) 142/102, pulse (!) 106, temperature 97.9 F (36.6 C), temperature source Oral, resp. rate 20, height 5\' 1"  (1.549 m), weight 47 kg, SpO2 91 %. Physical Exam  Vitals reviewed. Constitutional: He is oriented to person, place, and time. He appears well-developed and well-nourished. No distress.  HENT:  Head: Normocephalic and atraumatic.  Mouth/Throat: Oropharynx is clear and moist.  Eyes: Pupils are equal, round, and reactive to light. Conjunctivae and EOM are normal. No scleral icterus.  Neck: Normal range of motion. Neck supple. No JVD present.  No tracheal deviation present. No thyromegaly  present.  Cardiovascular: Normal rate, regular rhythm and normal heart sounds. Exam reveals no gallop and no friction rub.  No murmur heard. Respiratory: Effort normal and breath sounds normal. No respiratory distress.  GI: Soft. Bowel sounds are normal. He exhibits no distension. There is no abdominal tenderness.  Genitourinary:    Genitourinary Comments: Deferred   Musculoskeletal: Normal range of motion.        General: No edema.  Lymphadenopathy:    He has no cervical adenopathy.  Neurological: He is alert and oriented to person, place, and time. No cranial nerve deficit.  Skin: Skin is warm and dry. No rash noted. No erythema.  Psychiatric: He has a normal mood and affect. His behavior is normal. Judgment and thought content normal.     Assessment/Plan This is a 43 year old male admitted for. 1.  NSTEMI: Dramatically elevated high-sensitivity troponin.  No EKG changes to suggest ischemia.  Likely related to increased demand as well as poor renal clearance.  Nonetheless I have continued heparin drip.  We will consult cardiology. 2.  Hypertension: Controlled; continue amlodipine, carvedilol and hydralazine to minimize myocardial oxygen demand. 3.  Pneumonia: Hospital-acquired; continue cefepime and vancomycin.  Wean supplemental O2 as tolerated. 4.  ESRD: On dialysis; consult nephrology for continuation of dialysis.  Continues Renvela for secondary hyperparathyroidism 5.  DVT prophylaxis: Therapeutic anticoagulation 6.  GI prophylaxis: None The patient is a full code.  Time spent on admission orders and patient care approximately 45 minutes   Harrie Foreman, MD 07/09/2019, 7:17 AM

## 2019-07-09 NOTE — Progress Notes (Signed)
Tx Initiated:     07/09/19 1558  Vital Signs  Temp 98.5 F (36.9 C)  Temp Source Oral  Pulse Rate 97  Pulse Rate Source Dinamap  Resp 15  BP (!) 140/109  BP Location Right Arm  BP Method Automatic  Patient Position (if appropriate) Lying  Oxygen Therapy  SpO2 99 %  O2 Device Nasal Cannula  O2 Flow Rate (L/min) 4 L/min  Pain Assessment  Pain Scale 0-10  Pain Score 0  Dialysis Weight  Weight 52.7 kg  Type of Weight Pre-Dialysis  During Hemodialysis Assessment  Blood Flow Rate (mL/min) 400 mL/min  Arterial Pressure (mmHg) -160 mmHg  Venous Pressure (mmHg) 140 mmHg  Transmembrane Pressure (mmHg) 60 mmHg  Ultrafiltration Rate (mL/min) 870 mL/min  Dialysate Flow Rate (mL/min) 600 ml/min  Conductivity: Machine  14.1  HD Safety Checks Performed Yes  Intra-Hemodialysis Comments Tx initiated

## 2019-07-09 NOTE — Progress Notes (Signed)
Pre HD:     07/09/19 1550  Vital Signs  Temp 98.5 F (36.9 C)  Temp Source Oral  Pulse Rate 98  Pulse Rate Source Dinamap  Resp 17  BP (!) 139/101  BP Location Right Arm  BP Method Automatic  Patient Position (if appropriate) Lying  Oxygen Therapy  SpO2 95 %  O2 Device Nasal Cannula  O2 Flow Rate (L/min) 2 L/min  Pain Assessment  Pain Scale 0-10  Pain Score 0  Dialysis Weight  Weight 52.7 kg  Type of Weight Pre-Dialysis  Time-Out for Hemodialysis  What Procedure? HD   Pt Identifiers(min of two) First/Last Name;MRN/Account#;Pt's DOB(use if MRN/Acct# not available  Correct Site? Yes  Correct Side? Yes  Correct Procedure? Yes  Consents Verified? Yes  Rad Studies Available? N/A  Safety Precautions Reviewed? Yes  Engineer, civil (consulting) Number 3  Station Number 1  UF/Alarm Test Passed  Conductivity: Meter 14  Conductivity: Machine  14.1  pH 7.4  Reverse Osmosis main  Normal Saline Lot Number [id number]  Dialyzer Lot Number [id number]  Disposable Set Lot Number [id number]  Machine Temperature 98.6 F (37 C)  Musician and Audible Yes  Blood Lines Intact and Secured Yes  Pre Treatment Patient Checks  Vascular access used during treatment Catheter  HD catheter dressing before treatment WDL  Patient is receiving dialysis in a chair  (no)  Hepatitis B Surface Antigen Results  (unknown)  Isolation Initiated  (yes)  Hepatitis B Surface Antibody  (unknown)  Hemodialysis Consent Verified Yes  Hemodialysis Standing Orders Initiated Yes  ECG (Telemetry) Monitor On Yes  Prime Ordered Normal Saline  Length of  DialysisTreatment -hour(s) 3 Hour(s)  Dialysis Treatment Comments  (Na 140)  Dialyzer Elisio 17H NR  Dialysate 3K;2.5 Ca  Dialysate Flow Ordered 600  Blood Flow Rate Ordered 400 mL/min  Ultrafiltration Goal 2 Liters  Dialysis Blood Pressure Support Ordered Normal Saline  Education / Care Plan  Dialysis Education Provided Yes  Documented Education in Care  Plan Yes  Outpatient Plan of Care Reviewed and on Chart Yes  Fistula / Graft Left Upper arm Arteriovenous vein graft  Placement Date/Time: 12/26/18 1024   Placed prior to admission: No  Orientation: Left  Access Location: Upper arm  Access Type: (c) Arteriovenous vein graft  Site Condition Other (Comment) (not accessed)  Hemodialysis Catheter Right Internal jugular Double lumen Temporary (Non-Tunneled)  No Placement Date or Time found.   Placed prior to admission: Yes  Orientation: Right  Access Location: Internal jugular  Hemodialysis Catheter Type: Double lumen Temporary (Non-Tunneled)  Site Condition No complications  Blue Lumen Status Blood return noted  Red Lumen Status Blood return noted  Purple Lumen Status N/A  Dressing Type Biopatch;Occlusive  Dressing Status Intact  Drainage Description None

## 2019-07-09 NOTE — ED Notes (Addendum)
ED TO INPATIENT HANDOFF REPORT  ED Nurse Name and Phone #: Karena Addison [phone]  S Name/Age/Gender Brett Small 43 y.o. male Room/Bed: ED06A/ED06A  Code Status   Code Status: Prior  Home/SNF/Other Home Patient oriented to: self, place, time and situation Is this baseline? Yes   Triage Complete: Triage complete  Chief Complaint [facility]  Triage Note Pt arrived via [facility] from home with SOB. Pt was just admitted here for a week with left lobe pneumonia. Oxygen sats in the 80's until placed on NRB with sats improving.    Allergies Allergies  Allergen Reactions  . Codeine Nausea Only    Patient questioned this (??)  . Sulfa Antibiotics Hives and Nausea And Vomiting    Level of Care/Admitting Diagnosis ED Disposition    ED Disposition Condition Comment   Admit  The patient appears reasonably stabilized for admission considering the current resources, flow, and capabilities available in the ED at this time, and I doubt any other [facility] requiring further screening and/or treatment in the ED prior to admission is  present.       B Medical/Surgery History Past Medical History:  Diagnosis Date  . ESRD (end stage renal disease) ([place])   . Hypertension   . Renal disorder   . Secondary hyperparathyroidism of renal origin [facility])    Past Surgical History:  Procedure Laterality Date  . A/V SHUNT INTERVENTION Left 01/19/2019   Procedure: LEFT UPPER EXTREMITY A/V SHUNTOGRAM / UPPER EXTREMITY ANGIOGRAM;  Surgeon: Algernon Huxley, MD;  Location: [place] CV LAB;  Service: Cardiovascular;  Laterality: Left;  . A/V SHUNT INTERVENTION Left 03/02/2019   Procedure: A/V SHUNT INTERVENTION;  Surgeon: Algernon Huxley, MD;  Location: [place] [facility];  Service: Cardiovascular;  Laterality: Left;  . AORTA - FEMORAL ARTERY BYPASS GRAFT    . AV FISTULA PLACEMENT Left 12/26/2018   Procedure: INSERTION OF GORE STRETCH VASCULAR 4-7MM X  45CM IN LEFT UPPER ARM;  Surgeon: Marty Heck, MD;  Location: [place];   Service: Vascular;  Laterality: Left;  . DIALYSIS/PERMA CATHETER REMOVAL N/A 02/06/2019   Procedure: DIALYSIS/PERMA CATHETER REMOVAL;  Surgeon: Algernon Huxley, MD;  Location: [place] [facility] LAB;  Service: Cardiovascular;  Laterality: N/A;     A IV Location/Drains/Wounds Patient Lines/Drains/Airways Status   Active Line/Drains/Airways    Name:   Placement date:   Placement time:   Site:   Days:   Peripheral IV 07/09/19 Right Hand   07/09/19    0138    Hand   less than 1   Fistula / Graft Left Upper arm Arteriovenous vein graft   12/26/18    1024    Upper arm   195   Fistula / Graft Right Internal jugular   -    -    Internal jugular      Wound / Incision (Open or Dehisced) 05/19/19 Ankle Right dry, scabbed   05/19/19    0620    Ankle   51          Intake/Output Last 24 hours No intake or output data in the 24 hours ending 07/09/19 0407  Labs/Imaging Results for orders placed or performed during the hospital encounter of 07/09/19 (from the past 48 hour(s))  CBC     Status: Abnormal   Collection Time: 07/09/19  1:35 AM  Result Value Ref Range   WBC 15.3 (H) 4.0 - 10.5 K/uL   RBC 3.47 (L) 4.22 - 5.81 MIL/uL   Hemoglobin 11.4 (  L) 13.0 - 17.0 g/dL   HCT 35.9 (L) 39.0 - 52.0 %   MCV 103.5 (H) 80.0 - 100.0 fL   MCH 32.9 26.0 - 34.0 pg   MCHC 31.8 30.0 - 36.0 g/dL   RDW 16.8 (H) 11.5 - 15.5 %   Platelets 185 150 - 400 K/uL   nRBC 0.0 0.0 - 0.2 %    Comment: Performed at [facility], [address] [place], [place] [address]  SARS Coronavirus 2 [facility] order, Performed in [facility] hospital lab) Nasopharyngeal Nasopharyngeal Swab     Status: None   Collection Time: 07/09/19  2:16 AM   Specimen: Nasopharyngeal Swab  Result Value Ref Range   SARS Coronavirus 2 NEGATIVE NEGATIVE    Comment: (NOTE) If result is NEGATIVE SARS-CoV-2 target nucleic acids are NOT DETECTED. The SARS-CoV-2 RNA is generally detectable in upper and lower  respiratory specimens during the  acute phase of infection. The lowest  concentration of SARS-CoV-2 viral copies this assay can detect is 250  copies / mL. A negative result does not preclude SARS-CoV-2 infection  and should not be used as the sole basis for treatment or other  patient management decisions.  A negative result may occur with  improper specimen collection / handling, submission of specimen other  than nasopharyngeal swab, presence of viral mutation(s) within the  areas targeted by this assay, and inadequate number of viral copies  (<250 copies / mL). A negative result must be combined with clinical  observations, patient history, and epidemiological information. If result is POSITIVE SARS-CoV-2 target nucleic acids are DETECTED. The SARS-CoV-2 RNA is generally detectable in upper and lower  respiratory specimens dur ing the acute phase of infection.  Positive  results are indicative of active infection with SARS-CoV-2.  Clinical  correlation with patient history and other diagnostic information is  necessary to determine patient infection status.  Positive results do  not rule out bacterial infection or co-infection with other viruses. If result is PRESUMPTIVE POSTIVE SARS-CoV-2 nucleic acids MAY BE PRESENT.   A presumptive positive result was obtained on the submitted specimen  and confirmed on repeat testing.  While 2019 novel coronavirus  (SARS-CoV-2) nucleic acids may be present in the submitted sample  additional confirmatory testing may be necessary for epidemiological  and / or clinical management purposes  to differentiate between  SARS-CoV-2 and other Sarbecovirus currently known to infect humans.  If clinically indicated additional testing with an alternate test  methodology [phone]) is advised. The SARS-CoV-2 RNA is generally  detectable in upper and lower respiratory sp ecimens during the acute  phase of infection. The expected result is Negative. Fact Sheet for Patients:   StrictlyIdeas.no Fact Sheet for Healthcare Providers: BankingDealers.co.za This test is not yet approved or cleared by the [place] FDA and has been authorized for detection and/or diagnosis of SARS-CoV-2 by FDA under an Emergency Use Authorization (EUA).  This EUA will remain in effect (meaning this test can be used) for the duration of the COVID-19 declaration under Section 564(b)(1) of the Act, 21 U.S.C. section 360bbb-3(b)(1), unless the authorization is terminated or revoked sooner. Performed at [facility], [address] [place], [place] [address]   Comprehensive metabolic panel     Status: Abnormal   Collection Time: 07/09/19  2:47 AM  Result Value Ref Range   Sodium 138 135 - 145 mmol/L   Potassium 3.9 3.5 - 5.1 mmol/L   Chloride 102 98 - 111 mmol/L  CO2 22 22 - 32 mmol/L   Glucose, Bld 141 (H) 70 - 99 mg/dL   BUN 33 (H) 6 - 20 mg/dL   Creatinine, Ser 4.44 (H) 0.61 - 1.24 mg/dL   Calcium 9.2 8.9 - 10.3 mg/dL   Total Protein 6.7 6.5 - 8.1 g/dL   Albumin 2.6 (L) 3.5 - 5.0 g/dL   AST 31 15 - 41 U/L   ALT 12 0 - 44 U/L   Alkaline Phosphatase 77 38 - 126 U/L   Total Bilirubin 2.5 (H) 0.3 - 1.2 mg/dL   GFR calc non Af Amer 15 (L) >60 mL/min   GFR calc Af Amer 17 (L) >60 mL/min   Anion gap 14 5 - 15    Comment: Performed at [facility], [address] [place], [place] [address]  Troponin I (High Sensitivity)     Status: Abnormal   Collection Time: 07/09/19  2:47 AM  Result Value Ref Range   Troponin I (High Sensitivity) 1,159 (HH) <18 ng/L    Comment: CRITICAL RESULT CALLED TO, READ BACK BY AND VERIFIED WITH DEE Bryahna Lesko ON 07/09/19 AT 0328 [facility] (NOTE) Elevated high sensitivity troponin I (hsTnI) values and significant  changes across serial measurements may suggest ACS but many other  chronic and acute conditions are known to elevate hsTnI results.  Refer to the "Links" section for chest pain  algorithms and additional  guidance. Performed at [facility], [place], [place] [address]   Lactic acid, plasma     Status: None   Collection Time: 07/09/19  2:47 AM  Result Value Ref Range   Lactic Acid, Venous 1.7 0.5 - 1.9 mmol/L    Comment: Performed at [facility], [address] [place], [place] [address]   Dg Chest Portable 1 View  Result Date: 07/09/2019 CLINICAL DATA:  43 year old male with shortness of breath. EXAM: PORTABLE CHEST 1 VIEW COMPARISON:  Portable chest 07/02/2019 and earlier. FINDINGS: Portable AP upright view at 0131 hours. Stable cardiomegaly and mediastinal contours. Stable lung volumes. Stable right chest dialysis type catheter. New confluent but indistinct multilobar right lung opacity, similar left perihilar lower lung opacity. No pneumothorax. Left upper lobe pulmonary vascularity appears normal. Visualized tracheal air column is within normal limits. Left upper extremity vascular graft again noted. Stable blunting of the left costophrenic angle. Negative visible bowel gas pattern. IMPRESSION: 1. Acute confluent multilobar right lung opacity, lesser left perihilar opacity. Favor acute bilateral pneumonia over asymmetric pulmonary edema. 2. Underlying cardiomegaly, right chest dialysis type catheter. Electronically Signed   By: Genevie Ann M.D.   On: 07/09/2019 02:11    Pending Labs Unresulted Labs (From admission, onward)    Start     Ordered   07/09/19 1200  Heparin level (unfractionated)  Once-Timed,   STAT     07/09/19 0405   07/09/19 1200  APTT  Once-Timed,   STAT     07/09/19 0405   07/09/19 0358  Protime-INR  Add-on,   AD     07/09/19 0357   07/09/19 0357  APTT  Add-on,   AD     07/09/19 0357   07/09/19 0228  Blood culture (routine x 2)  BLOOD CULTURE X 2,   STAT     07/09/19 0228          Vitals/Pain Today's Vitals   07/09/19 0200 07/09/19 0300 07/09/19 0330 07/09/19 0400  BP: (!) 152/110 (!) 145/101 (!)  142/101 120/88  Pulse: 100 Marland Kitchen)  106 (!) 105 (!) 105  Resp: 20 12 16 19   Temp:      TempSrc:      SpO2: 100% 100% 99% 99%  Weight:      Height:      PainSc:        Isolation Precautions Airborne and Contact precautions  Medications Medications  vancomycin (VANCOCIN) IVPB 1000 mg/200 mL premix (1,000 mg Intravenous New Bag/Given 07/09/19 0325)  heparin bolus via infusion 2,500 Units (has no administration in time range)    Followed by  heparin ADULT infusion 100 units/mL (25000 units/252mL sodium chloride 0.45%) (has no administration in time range)  nitroGLYCERIN (NITROGLYN) 2 % ointment 1 inch (1 inch Topical Given 07/09/19 0139)  labetalol (NORMODYNE) injection 10 mg (10 mg Intravenous Given 07/09/19 0149)  ceFEPIme (MAXIPIME) 2 g in sodium chloride 0.9 % 100 mL IVPB (0 g Intravenous Stopped 07/09/19 0322)  morphine 4 MG/ML injection 4 mg (4 mg Intravenous Given 07/09/19 0246)  ondansetron (ZOFRAN) injection 4 mg (4 mg Intravenous Given 07/09/19 0246)    Mobility walks Low fall risk   Focused Assessments    R Recommendations: See Admitting Provider Note  Report given to: Lexi, RN

## 2019-07-09 NOTE — Progress Notes (Signed)
Post HD Note:     07/09/19 1918  Vital Signs  Temp 98.5 F (36.9 C)  Temp Source Oral  Pulse Rate (!) 105  Pulse Rate Source Dinamap  Resp 18  BP (!) 172/126  BP Location Right Arm  BP Method Automatic  Patient Position (if appropriate) Lying  Oxygen Therapy  SpO2 94 %  O2 Device Nasal Cannula  O2 Flow Rate (L/min) 2 L/min  Pain Assessment  Pain Scale 0-10  Pain Score 0  Post-Hemodialysis Assessment  Rinseback Volume (mL) 250 mL  KECN 57.1 V  Dialyzer Clearance Lightly streaked  Duration of HD Treatment -hour(s) 3 hour(s)  Hemodialysis Intake (mL) 700 mL  UF Total -Machine (mL) 2496 mL  Net UF (mL) 1796 mL  Tolerated HD Treatment Yes  AVG/AVF Arterial Site Held (minutes)  (n/a)  AVG/AVF Venous Site Held (minutes)  (n/a)  Education / Care Plan  Dialysis Education Provided Yes  Documented Education in Care Plan Yes  Outpatient Plan of Care Reviewed and on Chart Yes  Hemodialysis Catheter Right Internal jugular Double lumen Temporary (Non-Tunneled)  No Placement Date or Time found.   Placed prior to admission: Yes  Orientation: Right  Access Location: Internal jugular  Hemodialysis Catheter Type: Double lumen Temporary (Non-Tunneled)  Site Condition No complications  Blue Lumen Status Heparin locked  Red Lumen Status Heparin locked  Purple Lumen Status N/A  Catheter fill solution Heparin 1000 units/ml  Catheter fill volume (Arterial) 2.2 cc  Catheter fill volume (Venous) 2  Dressing Type Biopatch;Occlusive  Dressing Status Dry;Intact  Drainage Description None  Post treatment catheter status Capped and Clamped

## 2019-07-09 NOTE — Progress Notes (Addendum)
[place] [facility] [place] NAME: Delmon [facility]    MR#:  [id number]  DATE OF BIRTH:  [birth year]  SUBJECTIVE:  CHIEF COMPLAINT:   Chief Complaint  Patient presents with  . Shortness of Breath   Patient is a 43y/o male with PMH of COPD, ESRD, MDD, Homelessness, and recent hospitalization due to PNA who presented to the ED with shortness of breath. He was found to have elevated troponins on admission in addition to a new left lower lobe opacity on CXR. This morning he is on 4L of O2NC and is breathing comfortably. He has no chest pain. He is not on home oxygen.   REVIEW OF SYSTEMS:  Review of Systems  Constitutional: Negative for chills and fever.  Respiratory: Positive for cough and shortness of breath. Negative for wheezing.   Cardiovascular: Positive for leg swelling. Negative for chest pain and palpitations.  Gastrointestinal: Negative for abdominal pain, constipation, diarrhea, nausea and vomiting.  Musculoskeletal: Negative for joint pain.  Neurological: Negative for loss of consciousness and weakness.    DRUG ALLERGIES:   Allergies  Allergen Reactions  . Codeine Nausea Only    Patient questioned this (??)  . Sulfa Antibiotics Hives and Nausea And Vomiting   VITALS:  Blood pressure (!) 140/100, pulse (!) 101, temperature 98.5 F (36.9 C), temperature source Oral, resp. rate 20, height 5\' 1"  (1.549 m), weight 47 kg, SpO2 95 %. PHYSICAL EXAMINATION:  Physical Exam  Gen: Alert and Oriented x 3, NAD HEENT: Normocephalic, atraumatic CV: Tachycardia, 2/6 systolic murmur, normal S1, S2 split Resp: CTAB, no wheezing, rales, or rhonchi, comfortable work of breathing Abd: non-distended, non-tender, soft, +bs in all four quadrants Ext: no clubbing, cyanosis, or edema Skin: warm, dry, intact, no rashes LABORATORY PANEL:  Male CBC Recent Labs  Lab 07/09/19 0135  WBC 15.3*  HGB 11.4*  HCT 35.9*  PLT 185    ------------------------------------------------------------------------------------------------------------------ Chemistries  Recent Labs  Lab 07/09/19 0247  NA 138  K 3.9  CL 102  CO2 22  GLUCOSE 141*  BUN 33*  CREATININE 4.44*  CALCIUM 9.2  AST 31  ALT 12  ALKPHOS 77  BILITOT 2.5*   RADIOLOGY:  Dg Chest Portable 1 View  Result Date: 07/09/2019 CLINICAL DATA:  43 year old male with shortness of breath. EXAM: PORTABLE CHEST 1 VIEW COMPARISON:  Portable chest 07/02/2019 and earlier. FINDINGS: Portable AP upright view at 0131 hours. Stable cardiomegaly and mediastinal contours. Stable lung volumes. Stable right chest dialysis type catheter. New confluent but indistinct multilobar right lung opacity, similar left perihilar lower lung opacity. No pneumothorax. Left upper lobe pulmonary vascularity appears normal. Visualized tracheal air column is within normal limits. Left upper extremity vascular graft again noted. Stable blunting of the left costophrenic angle. Negative visible bowel gas pattern. IMPRESSION: 1. Acute confluent multilobar right lung opacity, lesser left perihilar opacity. Favor acute bilateral pneumonia over asymmetric pulmonary edema. 2. Underlying cardiomegaly, right chest dialysis type catheter. Electronically Signed   By: Genevie Ann M.D.   On: 07/09/2019 02:11   ASSESSMENT AND PLAN:    1.  NSTEMI. Patient not having active chest pain but has elevated Troponin above baseline at 1,1759 -> 1174 this am. EKG showed sinus tachycardia with LVH. Patient is also tachycardic with a leukocytosis. TSH normal. Last lipid profile was 3/19/202 significant for elevated triglycerides. This could be due to HAP. See problem below. - Cardiology consulted: awaiting recs - Cont Heparin gtt - Cont Telemetry -  Control BP; cont home beta blocker and amlodipine - A1c, lipid panel  2. HAP. Patient presents with SOB and new oxygen requirement of 4L and not on home O2. He has a cough and was  recently admitted for CAP but did not finish his antibiotic treatment at home. Has new left lobe opacity on CXR this admission. Does not appear to fluid overloaded, no wheezing to suggest COPD on exam. - Cont Vanc and Cefepime - Cont supplemental O2 - Continuous pulse ox - daily CBC  3.  Hypertension: Not well controlled since admission; SBP range of 12-178 and DBP of 88-127. - Cont home Carvedilol, amlodipine, and hydralazine - monitor closely as patient is dialysis pt; see below - Daily BMP  4.  ESRD: On dialysis  - Nephrology consulted; on TTS schedule, has not missed any recent sessions. Will get dialysis today. - Cont Renvela for secondary hyperparathyroidism - Renal function panel tomorrow am  5. 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 - Vit B12 and Folate - Daily CBC   All the records are reviewed and case discussed with Care Management/Social Worker. Management plans discussed with the patient, family and they are in agreement.  CODE STATUS: Full Code  TOTAL TIME TAKING CARE OF THIS PATIENT: 45 minutes.   More than 50% of the time was spent in counseling/coordination of care: YES  POSSIBLE D/C IN 2-3 DAYS, DEPENDING ON CLINICAL CONDITION.   Nuala Alpha M.D on 07/09/2019 at 8:18 AM  Between 7am to 6pm - Pager - [phone]  After 6pm go to [email or web address] - Proofreader  [facility]  Office  [phone]  CC: Primary care physician; Patient, [name]  Note: This dictation was prepared with Dragon dictation along with smaller phrase technology. Any transcriptional errors that result from this process are unintentional.

## 2019-07-09 NOTE — Progress Notes (Signed)
HD Tx Completed:    07/09/19 1900  Hand-Off documentation  Report given to (Full Name) Linard Millers  Report received from (Full Name) S.Couch-Bah  Vital Signs  Temp 98.5 F (36.9 C)  Temp Source Oral  Pulse Rate (!) 102  Pulse Rate Source Dinamap  Resp 16  BP (!) 148/95  BP Location Right Arm  BP Method Automatic  Patient Position (if appropriate) Lying  Oxygen Therapy  SpO2 93 %  O2 Device Nasal Cannula  O2 Flow Rate (L/min) 2 L/min  Pain Assessment  Pain Scale 0-10  Pain Score 0  During Hemodialysis Assessment  KECN 57.1 KECN  Dialysis Fluid Bolus Normal Saline  Bolus Amount (mL) 250 mL  Intra-Hemodialysis Comments Tx completed;Tolerated well

## 2019-07-09 NOTE — Plan of Care (Signed)
  Problem: Activity: Goal: Risk for activity intolerance will decrease Outcome: Progressing   Problem: Nutrition: Goal: Adequate nutrition will be maintained Outcome: Progressing   Problem: Coping: Goal: Level of anxiety will decrease Outcome: Progressing   Problem: Safety: Goal: Ability to remain free from injury will improve Outcome: Progressing   Problem: Skin Integrity: Goal: Risk for impaired skin integrity will decrease Outcome: Progressing   

## 2019-07-09 NOTE — Consult Note (Signed)
Pharmacy Antibiotic Note  Brett Small is a 43 y.o. male admitted on 07/09/2019 with pneumonia.  Pharmacy has been consulted for Cefepime and vancomycin dosing.  Plan: Pt received vancomycin 1000 mg x 1. Pt has ESRD per notes HD schedule TTS. Will order vancomycin 500 mg on HD days. Plan to order random level after the 3rd dose. MRSA PCR ordered, if negative recommend to d/c vancomycin.   Cefepime 2 g on HD days.   Height: 5\' 1"  (154.9 cm) Weight: 103 lb 9.6 oz (47 kg) IBW/kg (Calculated) : 52.3  Temp (24hrs), Avg:98.7 F (37.1 C), Min:97.9 F (36.6 C), Max:99.6 F (37.6 C)  Recent Labs  Lab 07/02/19 1241 07/02/19 1403 07/02/19 1619 07/03/19 0634 07/04/19 1053 07/09/19 0135 07/09/19 0247  WBC 12.5*  --   --  10.4 9.2 15.3*  --   CREATININE 5.11*  --   --  3.24* 4.72*  --  4.44*  LATICACIDVEN  --  3.2* 3.5*  --   --   --  1.7    Estimated Creatinine Clearance: 14 mL/min (A) (by C-G formula based on SCr of 4.44 mg/dL (H)).    Allergies  Allergen Reactions  . Codeine Nausea Only    Patient questioned this (??)  . Sulfa Antibiotics Hives and Nausea And Vomiting    Antimicrobials this admission: 9/17 cefepime >>  9/17 vancomycin >>   Dose adjustments this admission: None  Microbiology results: 9/17 BCx: pending 9/17 MRSA PCR: ordered  Thank you for allowing pharmacy to be a part of this patient's care.  Oswald Hillock, PharmD, BCPS 07/09/2019 8:55 AM

## 2019-07-09 NOTE — ED Triage Notes (Signed)
Pt arrived via [facility] from home with SOB. Pt was just admitted here for a week with left lobe pneumonia. Oxygen sats in the 80's until placed on NRB with sats improving.

## 2019-07-09 NOTE — Progress Notes (Signed)
Post HD Assessment:     07/09/19 1918  Neurological  Level of Consciousness Alert  Orientation Level Oriented X4  Cardiac  Pulse Regular  Heart Sounds S1, S2  Jugular Venous Distention (JVD) No  ECG Monitor Yes  Vascular  R Radial Pulse +2  L Radial Pulse +2  Psychosocial  Psychosocial (WDL) WDL

## 2019-07-09 NOTE — Progress Notes (Signed)
Lab results called for critical troponin level.  Dr. Brett Albino aware.  Cardiology to see patient.

## 2019-07-09 NOTE — ED Notes (Signed)
Pt receives dialysis, pt has a fistula in his left arm.

## 2019-07-09 NOTE — Progress Notes (Signed)
Pre HD Assessment:    07/09/19 1550  Neurological  Level of Consciousness Alert  Orientation Level Oriented X4  Respiratory  Respiratory Pattern Regular;Unlabored  Chest Assessment Chest expansion symmetrical  Bilateral Breath Sounds Diminished  Cardiac  Pulse Regular  Heart Sounds S1, S2  Jugular Venous Distention (JVD) No  ECG Monitor Yes  Vascular  R Radial Pulse +2  L Radial Pulse +2  Psychosocial  Psychosocial (WDL) WDL

## 2019-07-09 NOTE — Consult Note (Signed)
Brett Small is a 43 y.o. male  [id number]  Primary Cardiologist: Neoma Laming Reason for Consultation: Chest pain  HPI: 43 year old male with history of end-stage renal disease hypertension presented with chest pain and elevated troponin suggestive of non-STEMI and I was asked to evaluate the patient.  Chest pain was pressure type associated with shortness of breath in the retrosternal area radiating to her jaw.  Chest pain is relieved at this time.   Review of Systems: Patient has recent diagnosis of pneumonia for which she is taking antibiotics   Past Medical History:  Diagnosis Date  . ESRD (end stage renal disease) ([place])   . Hypertension   . Renal disorder   . Secondary hyperparathyroidism of renal origin [facility])     Medications Prior to Admission  Medication Sig Dispense Refill  . amLODipine (NORVASC) 10 MG tablet Take 1 tablet (10 mg total) by mouth daily at 8 pm. 30 tablet 2  . amoxicillin-clavulanate (AUGMENTIN) 875-125 MG tablet Take 1 tablet by mouth 2 (two) times daily for 7 days. 14 tablet 0  . apixaban (ELIQUIS) 2.5 MG TABS tablet Take 1 tablet (2.5 mg total) by mouth 2 (two) times daily. 60 tablet 1  . aspirin EC 81 MG tablet Take 1 tablet (81 mg total) by mouth daily. 30 tablet 1  . atorvastatin (LIPITOR) 80 MG tablet Take 1 tablet (80 mg total) by mouth at bedtime. 30 tablet 1  . azithromycin (ZITHROMAX Z-PAK) 250 MG tablet Take 2 tablets (500 mg) on  Day 1,  followed by 1 tablet (250 mg) once daily on Days 2 through 5. 6 each 0  . b complex-C-folic acid 1 MG capsule Take 1 capsule by mouth. With supper.    . carvedilol (COREG) 25 MG tablet Take 1 tablet (25 mg total) by mouth 2 (two) times daily with a meal. 60 tablet 1  . gabapentin (NEURONTIN) 100 MG capsule Take 1 capsule (100 mg total) by mouth 3 (three) times daily. 90 capsule 1  . hydrALAZINE (APRESOLINE) 25 MG tablet Take 1 tablet (25 mg total) by mouth every 8 (eight) hours. 90 tablet 1  . hydrOXYzine  (ATARAX/VISTARIL) 25 MG tablet Take 25 mg by mouth 3 (three) times daily as needed for anxiety.    Marland Kitchen loratadine (CLARITIN) 10 MG tablet Take 1 tablet (10 mg total) by mouth daily. 30 tablet 0  . multivitamin (RENA-VIT) TABS tablet Take 1 tablet by mouth daily. 30 each 1  . neomycin-bacitracin-polymyxin (NEOSPORIN) OINT Apply 1 application topically 2 (two) times daily. 56 g 1  . sevelamer carbonate (RENVELA) 800 MG tablet Take 2 tablets (1,600 mg total) by mouth 3 (three) times daily with meals. 180 tablet 1  . traMADol (ULTRAM) 50 MG tablet Take 1 tablet (50 mg total) by mouth every 12 (twelve) hours as needed for moderate pain or severe pain. 8 tablet 0  . Nutritional Supplements (FEEDING SUPPLEMENT, NEPRO CARB STEADY,) LIQD Take 237 mLs by mouth 2 (two) times daily between meals. 237 mL 30     . amLODipine  10 mg Oral Q2000  . carvedilol  25 mg Oral BID WC  . [START ON 07/10/2019] Chlorhexidine Gluconate Cloth  6 each Topical Q0600  . docusate sodium  100 mg Oral BID  . hydrALAZINE  25 mg Oral Q8H  . mouth rinse  15 mL Mouth Rinse BID  . sevelamer carbonate  1,600 mg Oral TID WC    Infusions: . ceFEPime (MAXIPIME) IV    .  heparin 650 Units/hr (07/09/19 1434)    Allergies  Allergen Reactions  . Codeine Nausea Only    Patient questioned this (??)  . Sulfa Antibiotics Hives and Nausea And Vomiting    Social History   Socioeconomic History  . Marital status: Single    Spouse name: Not on file  . Number of children: Not on file  . Years of education: Not on file  . Highest education level: Not on file  Occupational History  . Not on file  Social Needs  . Financial resource strain: Not on file  . Food insecurity    Worry: Not on file    Inability: Not on file  . Transportation needs    Medical: Not on file    Non-medical: Not on file  Tobacco Use  . Smoking status: Current Every Day Smoker    Packs/day: 0.50    Years: 30.00    Pack years: 15.00    Types: Cigarettes     Last attempt to quit: 06/26/2018    Years since quitting: 1.0  . Smokeless tobacco: Never Used  . Tobacco comment: smoked for 30 years   Substance and Sexual Activity  . Alcohol use: Not Currently  . Drug use: Yes    Frequency: 1.0 times per week    Types: Marijuana    Comment: Pt states "maybe once or twice a week".   . Sexual activity: Not on file  Lifestyle  . Physical activity    Days per week: Not on file    Minutes per session: Not on file  . Stress: Not on file  Relationships  . Social Herbalist on phone: Not on file    Gets together: Not on file    Attends religious service: Not on file    Active member of club or organization: Not on file    Attends meetings of clubs or organizations: Not on file    Relationship status: Not on file  . Intimate partner violence    Fear of current or ex partner: Patient refused    Emotionally abused: Patient refused    Physically abused: Patient refused    Forced sexual activity: Patient refused  Other Topics Concern  . Not on file  Social History Narrative  . Not on file    Family History  Problem Relation Age of Onset  . Hypertension Other   . Diabetes Other   . Clotting disorder Father     PHYSICAL EXAM: Vitals:   07/09/19 0606 07/09/19 0749  BP:  (!) 140/100  Pulse: (!) 106 (!) 101  Resp:    Temp:  98.5 F (36.9 C)  SpO2: 91% 95%     Intake/Output Summary (Last 24 hours) at 07/09/2019 1603 Last data filed at 07/09/2019 1500 Gross per 24 hour  Intake 750.02 ml  Output -  Net 750.02 ml    General:  Well appearing. No respiratory difficulty HEENT: normal Neck: supple. no JVD. Carotids 2+ bilat; no bruits. No lymphadenopathy or thryomegaly appreciated. Cor: PMI nondisplaced. Regular rate & rhythm. No rubs, gallops or murmurs. Lungs: clear Abdomen: soft, nontender, nondistended. No hepatosplenomegaly. No bruits or masses. Good bowel sounds. Extremities: no cyanosis, clubbing, rash, edema Neuro:  alert & oriented x 3, cranial nerves grossly intact. moves all 4 extremities w/o difficulty. Affect pleasant.  ECG: Normal sinus rhythm with sinus tachycardia no acute changes  Results for orders placed or performed during the hospital encounter of 07/09/19 (from the past 24  hour(s))  CBC     Status: Abnormal   Collection Time: 07/09/19  1:35 AM  Result Value Ref Range   WBC 15.3 (H) 4.0 - 10.5 K/uL   RBC 3.47 (L) 4.22 - 5.81 MIL/uL   Hemoglobin 11.4 (L) 13.0 - 17.0 g/dL   HCT 35.9 (L) 39.0 - 52.0 %   MCV 103.5 (H) 80.0 - 100.0 fL   MCH 32.9 26.0 - 34.0 pg   MCHC 31.8 30.0 - 36.0 g/dL   RDW 16.8 (H) 11.5 - 15.5 %   Platelets 185 150 - 400 K/uL   nRBC 0.0 0.0 - 0.2 %  SARS Coronavirus 2 [facility] order, Performed in [place] hospital lab) Nasopharyngeal Nasopharyngeal Swab     Status: None   Collection Time: 07/09/19  2:16 AM   Specimen: Nasopharyngeal Swab  Result Value Ref Range   SARS Coronavirus 2 NEGATIVE NEGATIVE  Comprehensive metabolic panel     Status: Abnormal   Collection Time: 07/09/19  2:47 AM  Result Value Ref Range   Sodium 138 135 - 145 mmol/L   Potassium 3.9 3.5 - 5.1 mmol/L   Chloride 102 98 - 111 mmol/L   CO2 22 22 - 32 mmol/L   Glucose, Bld 141 (H) 70 - 99 mg/dL   BUN 33 (H) 6 - 20 mg/dL   Creatinine, Ser 4.44 (H) 0.61 - 1.24 mg/dL   Calcium 9.2 8.9 - 10.3 mg/dL   Total Protein 6.7 6.5 - 8.1 g/dL   Albumin 2.6 (L) 3.5 - 5.0 g/dL   AST 31 15 - 41 U/L   ALT 12 0 - 44 U/L   Alkaline Phosphatase 77 38 - 126 U/L   Total Bilirubin 2.5 (H) 0.3 - 1.2 mg/dL   GFR calc non Af Amer 15 (L) >60 mL/min   GFR calc Af Amer 17 (L) >60 mL/min   Anion gap 14 5 - 15  Troponin I (High Sensitivity)     Status: Abnormal   Collection Time: 07/09/19  2:47 AM  Result Value Ref Range   Troponin I (High Sensitivity) 1,159 (HH) <18 ng/L  Blood culture (routine x 2)     Status: None (Preliminary result)   Collection Time: 07/09/19  2:47 AM   Specimen: BLOOD  Result Value Ref  Range   Specimen Description BLOOD RIGHT ASSIST CONTROL    Special Requests      BOTTLES DRAWN AEROBIC AND ANAEROBIC Blood Culture adequate volume   Culture      NO GROWTH < 12 HOURS Performed at [facility], [address] [place], [place] [address]    Report Status PENDING   Blood culture (routine x 2)     Status: None (Preliminary result)   Collection Time: 07/09/19  2:47 AM   Specimen: BLOOD  Result Value Ref Range   Specimen Description BLOOD RIGHT HAND    Special Requests      BOTTLES DRAWN AEROBIC AND ANAEROBIC Blood Culture adequate volume   Culture      NO GROWTH < 12 HOURS Performed at [facility], [address] [place], [place] [address]    Report Status PENDING   Lactic acid, plasma     Status: None   Collection Time: 07/09/19  2:47 AM  Result Value Ref Range   Lactic Acid, Venous 1.7 0.5 - 1.9 mmol/L  Procalcitonin - Baseline     Status: None   Collection Time: 07/09/19  2:47 AM  Result Value Ref Range  Procalcitonin 0.39 ng/mL  Troponin I (High Sensitivity)     Status: Abnormal   Collection Time: 07/09/19  4:41 AM  Result Value Ref Range   Troponin I (High Sensitivity) 1,174 (HH) <18 ng/L  APTT     Status: Abnormal   Collection Time: 07/09/19  4:41 AM  Result Value Ref Range   aPTT 37 (H) 24 - 36 seconds  Protime-INR     Status: Abnormal   Collection Time: 07/09/19  4:41 AM  Result Value Ref Range   Prothrombin Time 17.2 (H) 11.4 - 15.2 seconds   INR 1.4 (H) 0.8 - 1.2  TSH     Status: None   Collection Time: 07/09/19  4:41 AM  Result Value Ref Range   TSH 3.181 0.350 - 4.500 uIU/mL  MRSA PCR Screening     Status: None   Collection Time: 07/09/19 11:26 AM   Specimen: Nasal Mucosa; Nasopharyngeal  Result Value Ref Range   MRSA by PCR NEGATIVE NEGATIVE  Heparin level (unfractionated)     Status: None   Collection Time: 07/09/19 12:38 PM  Result Value Ref Range   Heparin Unfractionated 0.34 0.30 - 0.70 IU/mL  APTT     Status:  Abnormal   Collection Time: 07/09/19 12:38 PM  Result Value Ref Range   aPTT 43 (H) 24 - 36 seconds  Folate     Status: None   Collection Time: 07/09/19 12:38 PM  Result Value Ref Range   Folate 11.5 >5.9 ng/mL  Troponin I (High Sensitivity)     Status: Abnormal   Collection Time: 07/09/19  1:50 PM  Result Value Ref Range   Troponin I (High Sensitivity) 1,240 (HH) <18 ng/L   Dg Chest Portable 1 View  Result Date: 07/09/2019 CLINICAL DATA:  43 year old male with shortness of breath. EXAM: PORTABLE CHEST 1 VIEW COMPARISON:  Portable chest 07/02/2019 and earlier. FINDINGS: Portable AP upright view at 0131 hours. Stable cardiomegaly and mediastinal contours. Stable lung volumes. Stable right chest dialysis type catheter. New confluent but indistinct multilobar right lung opacity, similar left perihilar lower lung opacity. No pneumothorax. Left upper lobe pulmonary vascularity appears normal. Visualized tracheal air column is within normal limits. Left upper extremity vascular graft again noted. Stable blunting of the left costophrenic angle. Negative visible bowel gas pattern. IMPRESSION: 1. Acute confluent multilobar right lung opacity, lesser left perihilar opacity. Favor acute bilateral pneumonia over asymmetric pulmonary edema. 2. Underlying cardiomegaly, right chest dialysis type catheter. Electronically Signed   By: Genevie Ann M.D.   On: 07/09/2019 02:11     ASSESSMENT AND PLAN: Non-STEMI with acute coronary syndrome with risk factors for coronary artery disease and chest pain.  Patient was explained risk and benefits and patient has agreed to left heart catheterization in a.m.  Mauriana Dann A

## 2019-07-09 NOTE — Progress Notes (Signed)
PHARMACY -  BRIEF ANTIBIOTIC NOTE   Pharmacy has received consult(s) for Vancomycin and Cefepime from an ED provider.  The patient's profile has been reviewed for ht/wt/allergies/indication/available labs.    One time order(s) placed for Cefepime 2gm and Vancomycin 1gm.  Further antibiotics/pharmacy consults should be ordered by admitting physician if indicated.                       Thank you, Hart Robinsons A 07/09/2019  2:40 AM

## 2019-07-09 NOTE — Progress Notes (Signed)
ANTICOAGULATION CONSULT NOTE - Initial Consult  Pharmacy Consult for HEPARIN Indication: chest pain/ACS  Allergies  Allergen Reactions  . Codeine Nausea Only    Patient questioned this (??)  . Sulfa Antibiotics Hives and Nausea And Vomiting    Patient Measurements: Height: 5' (152.4 cm) Weight: 100 lb 1.4 oz (45.4 kg) IBW/kg (Calculated) : 50 HEPARIN DW (KG): 45.4  Vital Signs: Temp: 99.6 F (37.6 C) (09/17 0140) Temp Source: Oral (09/17 0140) BP: 142/101 (09/17 0330) Pulse Rate: 105 (09/17 0330)  Labs: Recent Labs    07/09/19 0135 07/09/19 0247  HGB 11.4*  --   HCT 35.9*  --   PLT 185  --   CREATININE  --  4.44*  TROPONINIHS  --  1,159*   Estimated Creatinine Clearance: 13.5 mL/min (A) (by C-G formula based on SCr of 4.44 mg/dL (H)).  Medical History: Past Medical History:  Diagnosis Date  . ESRD (end stage renal disease) ([place])   . Hypertension   . Renal disorder   . Secondary hyperparathyroidism of renal origin [facility])    Medications:  (Not in a hospital admission)  Assessment: Pharmacy asked to initiate Heparin for ACS.  Baseline labs ordered, pending.  Pt was on apixaban 2.5mg  BID per PTA med list but last dose taken not recorded.    Goal of Therapy:  Heparin level 0.3-0.7 units/ml Monitor platelets by anticoagulation protocol: Yes   Plan:  Heparin 2500 units IV bolus x 1 Heparin infusion at 550 units/hr Check HL and aPTT in 8 hours Monitor for s/sx of bleeding complications  Hart Robinsons A 07/09/2019,4:00 AM

## 2019-07-09 NOTE — Progress Notes (Signed)
Patient off floor at this time for HD. 

## 2019-07-09 NOTE — Progress Notes (Signed)
ANTICOAGULATION CONSULT NOTE - Initial Consult  Pharmacy Consult for HEPARIN Indication: chest pain/ACS  Allergies  Allergen Reactions  . Codeine Nausea Only    Patient questioned this (??)  . Sulfa Antibiotics Hives and Nausea And Vomiting    Patient Measurements: Height: 5\' 1"  (154.9 cm) Weight: 103 lb 9.6 oz (47 kg) IBW/kg (Calculated) : 52.3 HEPARIN DW (KG): 47  Vital Signs: Temp: 98.5 F (36.9 C) (09/17 0749) Temp Source: Oral (09/17 0749) BP: 140/100 (09/17 0749) Pulse Rate: 101 (09/17 0749)  Labs: Recent Labs    07/09/19 0135 07/09/19 0247 07/09/19 0441 07/09/19 1238  HGB 11.4*  --   --   --   HCT 35.9*  --   --   --   PLT 185  --   --   --   APTT  --   --  37* 43*  LABPROT  --   --  17.2*  --   INR  --   --  1.4*  --   HEPARINUNFRC  --   --   --  0.34  CREATININE  --  4.44*  --   --   TROPONINIHS  --  1,159* 1,174*  --    Estimated Creatinine Clearance: 14 mL/min (A) (by C-G formula based on SCr of 4.44 mg/dL (H)).  Medical History: Past Medical History:  Diagnosis Date  . ESRD (end stage renal disease) ([employer])   . Hypertension   . Renal disorder   . Secondary hyperparathyroidism of renal origin [facility])    Medications:  Medications Prior to Admission  Medication Sig Dispense Refill Last Dose  . amLODipine (NORVASC) 10 MG tablet Take 1 tablet (10 mg total) by mouth daily at 8 pm. 30 tablet 2 Past Week at Unknown  . amoxicillin-clavulanate (AUGMENTIN) 875-125 MG tablet Take 1 tablet by mouth 2 (two) times daily for 7 days. 14 tablet 0 07/08/2019 at 1100  . apixaban (ELIQUIS) 2.5 MG TABS tablet Take 1 tablet (2.5 mg total) by mouth 2 (two) times daily. 60 tablet 1 07/08/2019 at 1100  . aspirin EC 81 MG tablet Take 1 tablet (81 mg total) by mouth daily. 30 tablet 1 07/08/2019 at 1100  . atorvastatin (LIPITOR) 80 MG tablet Take 1 tablet (80 mg total) by mouth at bedtime. 30 tablet 1 Past Week at Unknown  . azithromycin (ZITHROMAX Z-PAK) 250 MG tablet Take 2  tablets (500 mg) on  Day 1,  followed by 1 tablet (250 mg) once daily on Days 2 through 5. 6 each 0 07/08/2019 at 1100  . b complex-C-folic acid 1 MG capsule Take 1 capsule by mouth. With supper.   Past Week at Unknown  . carvedilol (COREG) 25 MG tablet Take 1 tablet (25 mg total) by mouth 2 (two) times daily with a meal. 60 tablet 1 07/08/2019 at 1100  . gabapentin (NEURONTIN) 100 MG capsule Take 1 capsule (100 mg total) by mouth 3 (three) times daily. 90 capsule 1 07/08/2019 at 1100  . hydrALAZINE (APRESOLINE) 25 MG tablet Take 1 tablet (25 mg total) by mouth every 8 (eight) hours. 90 tablet 1 07/08/2019 at 1100  . hydrOXYzine (ATARAX/VISTARIL) 25 MG tablet Take 25 mg by mouth 3 (three) times daily as needed for anxiety.   prn at prn  . loratadine (CLARITIN) 10 MG tablet Take 1 tablet (10 mg total) by mouth daily. 30 tablet 0 07/08/2019 at 1100  . multivitamin (RENA-VIT) TABS tablet Take 1 tablet by mouth daily. 30 each 1 07/08/2019  at 1100  . neomycin-bacitracin-polymyxin (NEOSPORIN) OINT Apply 1 application topically 2 (two) times daily. 56 g 1 Unknown at Unknown  . sevelamer carbonate (RENVELA) 800 MG tablet Take 2 tablets (1,600 mg total) by mouth 3 (three) times daily with meals. 180 tablet 1 07/08/2019 at 1100  . traMADol (ULTRAM) 50 MG tablet Take 1 tablet (50 mg total) by mouth every 12 (twelve) hours as needed for moderate pain or severe pain. 8 tablet 0 prn at prn  . Nutritional Supplements (FEEDING SUPPLEMENT, NEPRO CARB STEADY,) LIQD Take 237 mLs by mouth 2 (two) times daily between meals. 237 mL 30    Assessment: Pharmacy asked to initiate Heparin for ACS.  Baseline labs ordered, pending.  Pt was on apixaban 2.5mg  BID per PTA med list but last dose taken not recorded.  No baseline Heparin level was ordered. Heparin level (9/17 @1238 ) does not seem to be falsely elevated. We will monitor heparin with anti-xa levels.   Heparin 2550 units bolus followed by 550 units/hr.  9/17 1238 HL 0.34 aPTT  43. Increased to 650 units/hr   Goal of Therapy:  Heparin level 0.3-0.7 units/ml Monitor platelets by anticoagulation protocol: Yes   Plan:  Heparin level is therapeutic but aPTT is subtherapeutic. Because both levels are not exactly correlating and heparin level is on the low range of therapeutic, I will increase the infusion rate to 650 units/hr Check HL in 8 hours and CBC daily.  Monitor for s/sx of bleeding complications  Oswald Hillock, PharmD, BCPS 07/09/2019,1:12 PM

## 2019-07-10 ENCOUNTER — Encounter: Payer: Self-pay | Admitting: Cardiovascular Disease

## 2019-07-10 ENCOUNTER — Encounter
Admission: EM | Disposition: A | Discharge status: Home / Self Care | Payer: Self-pay | Source: Home / Self Care | Attending: Internal Medicine

## 2019-07-10 HISTORY — PX: LEFT HEART CATH AND CORONARY ANGIOGRAPHY: CATH118249

## 2019-07-10 LAB — CBC
HCT: 29.2 % — ABNORMAL LOW (ref 39.0–52.0)
Hemoglobin: 9.2 g/dL — ABNORMAL LOW (ref 13.0–17.0)
MCH: 32.1 pg (ref 26.0–34.0)
MCHC: 31.5 g/dL (ref 30.0–36.0)
MCV: 101.7 fL — ABNORMAL HIGH (ref 80.0–100.0)
Platelets: 165 10*3/uL (ref 150–400)
RBC: 2.87 MIL/uL — ABNORMAL LOW (ref 4.22–5.81)
RDW: 16.4 % — ABNORMAL HIGH (ref 11.5–15.5)
WBC: 8.1 10*3/uL (ref 4.0–10.5)
nRBC: 0 % (ref 0.0–0.2)

## 2019-07-10 LAB — RENAL FUNCTION PANEL
Albumin: 2.2 g/dL — ABNORMAL LOW (ref 3.5–5.0)
Anion gap: 15 (ref 5–15)
BUN: 25 mg/dL — ABNORMAL HIGH (ref 6–20)
CO2: 23 mmol/L (ref 22–32)
Calcium: 8.6 mg/dL — ABNORMAL LOW (ref 8.9–10.3)
Chloride: 101 mmol/L (ref 98–111)
Creatinine, Ser: 3.34 mg/dL — ABNORMAL HIGH (ref 0.61–1.24)
GFR calc Af Amer: 24 mL/min — ABNORMAL LOW (ref >60)
GFR calc non Af Amer: 21 mL/min — ABNORMAL LOW (ref >60)
Glucose, Bld: 94 mg/dL (ref 70–99)
Phosphorus: 3.7 mg/dL (ref 2.5–4.6)
Potassium: 3.6 mmol/L (ref 3.5–5.1)
Sodium: 139 mmol/L (ref 135–145)

## 2019-07-10 LAB — PROCALCITONIN: Procalcitonin: 0.81 ng/mL

## 2019-07-10 LAB — TROPONIN I (HIGH SENSITIVITY): Troponin I (High Sensitivity): 894 ng/L (ref <18)

## 2019-07-10 LAB — VITAMIN B12: Vitamin B-12: 366 pg/mL (ref 180–914)

## 2019-07-10 LAB — HEMOGLOBIN A1C
Hgb A1c MFr Bld: 4.7 % — ABNORMAL LOW (ref 4.8–5.6)
Mean Plasma Glucose: 88 mg/dL

## 2019-07-10 LAB — LIPID PANEL
Cholesterol: 112 mg/dL (ref 0–200)
HDL: 43 mg/dL (ref >40)
LDL Cholesterol: 55 mg/dL (ref 0–99)
Total CHOL/HDL Ratio: 2.6 RATIO
Triglycerides: 71 mg/dL (ref <150)
VLDL: 14 mg/dL (ref 0–40)

## 2019-07-10 LAB — HEPARIN LEVEL (UNFRACTIONATED): Heparin Unfractionated: 0.15 IU/mL — ABNORMAL LOW (ref 0.30–0.70)

## 2019-07-10 SURGERY — LEFT HEART CATH AND CORONARY ANGIOGRAPHY
Anesthesia: Moderate Sedation | Laterality: Right

## 2019-07-10 MED ORDER — ACETAMINOPHEN 325 MG PO TABS
650.0000 mg | ORAL_TABLET | ORAL | Status: DC | PRN
  Administered 2019-07-10 – 2019-07-11 (×2): 650 mg via ORAL

## 2019-07-10 MED ORDER — IOHEXOL 300 MG/ML  SOLN
INTRAMUSCULAR | Status: DC | PRN
  Administered 2019-07-10: 35 mL

## 2019-07-10 MED ORDER — LEVOFLOXACIN 500 MG PO TABS
500.0000 mg | ORAL_TABLET | ORAL | Status: DC
  Administered 2019-07-12: 08:00:00 500 mg via ORAL
  Filled 2019-07-10: qty 1

## 2019-07-10 MED ORDER — ONDANSETRON HCL 4 MG/2ML IJ SOLN: 4.0000 mg | Freq: Four times a day (QID) | INTRAMUSCULAR | Status: DC | PRN

## 2019-07-10 MED ORDER — MIDAZOLAM HCL 2 MG/2ML IJ SOLN
INTRAMUSCULAR | Status: AC
  Filled 2019-07-10: qty 2

## 2019-07-10 MED ORDER — LABETALOL HCL 5 MG/ML IV SOLN: 10.0000 mg | INTRAVENOUS | Status: AC | PRN

## 2019-07-10 MED ORDER — SODIUM CHLORIDE 0.9 % IV SOLN: 250.0000 mL | INTRAVENOUS | Status: DC | PRN

## 2019-07-10 MED ORDER — LEVOFLOXACIN 750 MG PO TABS
750.0000 mg | ORAL_TABLET | Freq: Once | ORAL | Status: AC
  Administered 2019-07-10: 750 mg via ORAL
  Filled 2019-07-10: qty 1

## 2019-07-10 MED ORDER — FENTANYL CITRATE (PF) 100 MCG/2ML IJ SOLN
INTRAMUSCULAR | Status: AC
  Filled 2019-07-10: qty 2

## 2019-07-10 MED ORDER — SODIUM CHLORIDE 0.9% FLUSH: 3.0000 mL | INTRAVENOUS | Status: DC | PRN

## 2019-07-10 MED ORDER — MIDAZOLAM HCL 2 MG/2ML IJ SOLN
INTRAMUSCULAR | Status: DC | PRN
  Administered 2019-07-10: 1 mg via INTRAVENOUS

## 2019-07-10 MED ORDER — FENTANYL CITRATE (PF) 100 MCG/2ML IJ SOLN
INTRAMUSCULAR | Status: DC | PRN
  Administered 2019-07-10 (×2): 25 ug via INTRAVENOUS

## 2019-07-10 MED ORDER — HEPARIN (PORCINE) IN NACL 2000-0.9 UNIT/L-% IV SOLN
INTRAVENOUS | Status: DC | PRN
  Administered 2019-07-10: 500 mL

## 2019-07-10 MED ORDER — SODIUM CHLORIDE 0.9% FLUSH
3.0000 mL | Freq: Two times a day (BID) | INTRAVENOUS | Status: DC
  Administered 2019-07-10 – 2019-07-12 (×2): 3 mL via INTRAVENOUS

## 2019-07-10 MED ORDER — HYDRALAZINE HCL 20 MG/ML IJ SOLN: 10.0000 mg | INTRAMUSCULAR | Status: AC | PRN

## 2019-07-10 MED ORDER — SODIUM CHLORIDE 0.9% FLUSH
3.0000 mL | Freq: Two times a day (BID) | INTRAVENOUS | Status: DC
  Administered 2019-07-10 – 2019-07-12 (×4): 3 mL via INTRAVENOUS

## 2019-07-10 MED ORDER — HEPARIN BOLUS VIA INFUSION
1300.0000 [IU] | Freq: Once | INTRAVENOUS | Status: AC
  Administered 2019-07-10: 09:00:00 1300 [IU] via INTRAVENOUS
  Filled 2019-07-10: qty 1300

## 2019-07-10 MED ORDER — HEPARIN (PORCINE) IN NACL 1000-0.9 UT/500ML-% IV SOLN
INTRAVENOUS | Status: AC
  Filled 2019-07-10: qty 1000

## 2019-07-10 MED ORDER — SODIUM CHLORIDE 0.9 % WEIGHT BASED INFUSION: 1.0000 mL/kg/h | INTRAVENOUS | Status: AC

## 2019-07-10 MED ORDER — SODIUM CHLORIDE 0.9 % WEIGHT BASED INFUSION: 1.0000 mL/kg/h | INTRAVENOUS | Status: DC

## 2019-07-10 SURGICAL SUPPLY — 9 items
CATH INFINITI 5FR ANG PIGTAIL (CATHETERS) ×3 IMPLANT
CATH INFINITI 5FR JL4 (CATHETERS) ×3 IMPLANT
CATH INFINITI JR4 5F (CATHETERS) ×3 IMPLANT
KIT MANI 3VAL PERCEP (MISCELLANEOUS) ×3 IMPLANT
NEEDLE PERC 18GX7CM (NEEDLE) ×3 IMPLANT
PACK CARDIAC CATH (CUSTOM PROCEDURE TRAY) ×3 IMPLANT
SHEATH AVANTI 5FR X 11CM (SHEATH) ×3 IMPLANT
WIRE EMERALD 3MM-J .035X260CM (WIRE) ×3 IMPLANT
WIRE GUIDERIGHT .035X150 (WIRE) ×3 IMPLANT

## 2019-07-10 NOTE — Plan of Care (Signed)
  Problem: Clinical Measurements: Goal: Cardiovascular complication will be avoided Outcome: Progressing   Problem: Activity: Goal: Risk for activity intolerance will decrease Outcome: Progressing   Problem: Nutrition: Goal: Adequate nutrition will be maintained Outcome: Progressing   Problem: Coping: Goal: Level of anxiety will decrease Outcome: Progressing   Problem: Elimination: Goal: Will not experience complications related to bowel motility Outcome: Progressing   Problem: Safety: Goal: Ability to remain free from injury will improve Outcome: Progressing

## 2019-07-10 NOTE — Progress Notes (Addendum)
[place] [facility] [place] NAME: Brett Small    MR#:  [id number]  DATE OF BIRTH:  [birth year]  SUBJECTIVE:  CHIEF COMPLAINT:   Chief Complaint  Patient presents with  . Shortness of Breath   Patient is a 43y/o here due to concern for NSTEMI and HAP. He has no chest pain this morning and states his breathing is a little improved. He has no complaints except that he is "hungry". He understands he will have a heart cath today and is in agreement with the plan. Still on supplemental oxygen.  REVIEW OF SYSTEMS:  Review of Systems  Constitutional: Negative for chills and fever.  Respiratory: Positive for cough and shortness of breath. Negative for wheezing.   Cardiovascular: Positive for leg swelling. Negative for chest pain and palpitations.  Gastrointestinal: Negative for abdominal pain, constipation, diarrhea, nausea and vomiting.  Musculoskeletal: Negative for joint pain.  Neurological: Negative for loss of consciousness and weakness.    DRUG ALLERGIES:   Allergies  Allergen Reactions  . Codeine Nausea Only    Patient questioned this (??)  . Sulfa Antibiotics Hives and Nausea And Vomiting   VITALS:  Blood pressure 129/90, pulse 83, temperature 98.5 F (36.9 C), temperature source Oral, resp. rate 19, height 5\' 1"  (1.549 m), weight 45.6 kg, SpO2 98 %. PHYSICAL EXAMINATION:  Physical Exam  Gen: Alert and Oriented x 3, NAD CV: RRR, no murmurs, normal S1, S2 split Resp: CTAB, no wheezing, rales, or rhonchi, comfortable work of breathing Abd: non-distended, non-tender, soft, +bs in all four quadrants Ext: no clubbing, cyanosis, mild pitting edema bilaterally Skin: warm, dry, intact, no rashes LABORATORY PANEL:  Male CBC Recent Labs  Lab 07/10/19 0630  WBC 8.1  HGB 9.2*  HCT 29.2*  PLT 165   ------------------------------------------------------------------------------------------------------------------ Chemistries  Recent  Labs  Lab 07/09/19 0247 07/10/19 0630  NA 138 139  K 3.9 3.6  CL 102 101  CO2 22 23  GLUCOSE 141* 94  BUN 33* 25*  CREATININE 4.44* 3.34*  CALCIUM 9.2 8.6*  AST 31  --   ALT 12  --   ALKPHOS 77  --   BILITOT 2.5*  --    RADIOLOGY:  No results found. ASSESSMENT AND PLAN:   1.  NSTEMI. No active chest pain this am. Troponin above baseline at 1,1759 -> 1174 this am. EKG showed sinus tachycardia with LVH. Patient is also tachycardic with a leukocytosis. TSH normal. Lipids well controlled. A1c 4.7. This could be due to HAP. See problem below. - Cardiology consulted: left heart cath planned for today - Cont Heparin gtt - Cont Telemetry - Control BP; cont home beta blocker and amlodipine  2. HAP. Acute, improved. WBC normal at 8.1 with no fever since admission. Patient still subjectively SOB and new oxygen requirement. Recent hospitalization for CAP but did not finish his antibiotic treatment at home. Has new left lobe opacity on CXR this admission. Does not appear to fluid overloaded, no wheezing to suggest COPD on exam. BCx negative x 1 day. - Stopped Vanc due to MRSA PCR negative, Cefepime (9/17-9/18) - Starting Levoquin today per pharm - Cont supplemental O2 - Continuous pulse ox - daily CBC  3.  Hypertension: Chronic, stable; Improved to goal this am. SBP range of 99-172 and DBP of 69-126 in last 24hrs. Last BP 129/90 - Cont home Carvedilol, amlodipine, and hydralazine - monitor closely as patient is dialysis pt - Daily BMP  4.  ESRD:  On dialysis  - Nephrology consulted; on TTS schedule, has not missed any recent sessions. Keep TTS schedule. - Cont Renvela for secondary hyperparathyroidism - Renal function panel tomorrow am  5. 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 Vit B12 pending, Folate normal. - Daily CBC   All the records are reviewed and case discussed with Care Management/Social Worker. Management plans discussed with the patient, family and they  are in agreement.  CODE STATUS: Full Code  TOTAL TIME TAKING CARE OF THIS PATIENT: 45 minutes.   More than 50% of the time was spent in counseling/coordination of care: YES  POSSIBLE D/C IN 2-3 DAYS, DEPENDING ON CLINICAL CONDITION.   Nuala Alpha M.D on 07/10/2019 at 9:42 AM  Between 7am to 6pm - Pager - [phone]  After 6pm go to [email or web address] - Proofreader  [facility]  Office  [phone]  CC: Primary care physician; Patient, [name]  Note: This dictation was prepared with Dragon dictation along with smaller phrase technology. Any transcriptional errors that result from this process are unintentional.

## 2019-07-10 NOTE — Progress Notes (Signed)
SUBJECTIVE: Patient had chest pain but has resolved   Vitals:   07/09/19 1958 07/09/19 2130 07/10/19 0529 07/10/19 0727  BP: (!) 154/115 126/90 99/69 129/90  Pulse: (!) 110 96 83 83  Resp: 20  18 19   Temp: 98.2 F (36.8 C)  98.4 F (36.9 C) 98.5 F (36.9 C)  TempSrc: Oral  Oral Oral  SpO2: 95% 98% 97% 98%  Weight:   45.6 kg   Height:        Intake/Output Summary (Last 24 hours) at 07/10/2019 1134 Last data filed at 07/10/2019 0536 Gross per 24 hour  Intake 581.6 ml  Output 1871 ml  Net -1289.4 ml    LABS: Basic Metabolic Panel: Recent Labs    07/09/19 0247 07/10/19 0630  NA 138 139  K 3.9 3.6  CL 102 101  CO2 22 23  GLUCOSE 141* 94  BUN 33* 25*  CREATININE 4.44* 3.34*  CALCIUM 9.2 8.6*  PHOS  --  3.7   Liver Function Tests: Recent Labs    07/09/19 0247 07/10/19 0630  AST 31  --   ALT 12  --   ALKPHOS 77  --   BILITOT 2.5*  --   PROT 6.7  --   ALBUMIN 2.6* 2.2*   No results for input(s): LIPASE, AMYLASE in the last 72 hours. CBC: Recent Labs    07/09/19 0135 07/10/19 0630  WBC 15.3* 8.1  HGB 11.4* 9.2*  HCT 35.9* 29.2*  MCV 103.5* 101.7*  PLT 185 165   Cardiac Enzymes: No results for input(s): CKTOTAL, CKMB, CKMBINDEX, TROPONINI in the last 72 hours. BNP: Invalid input(s): POCBNP D-Dimer: No results for input(s): DDIMER in the last 72 hours. Hemoglobin A1C: Recent Labs    07/09/19 1238  HGBA1C 4.7*   Fasting Lipid Panel: Recent Labs    07/10/19 0630  CHOL 112  HDL 43  LDLCALC 55  TRIG 71  CHOLHDL 2.6   Thyroid Function Tests: Recent Labs    07/09/19 0441  TSH 3.181   Anemia Panel: Recent Labs    07/09/19 1238  FOLATE 11.5     PHYSICAL EXAM General: Well developed, well nourished, in no acute distress HEENT:  Normocephalic and atramatic Neck:  No JVD.  Lungs: Clear bilaterally to auscultation and percussion. Heart: HRRR . Normal S1 and S2 without gallops or murmurs.  Abdomen: Bowel sounds are positive, abdomen  soft and non-tender  Msk:  Back normal, normal gait. Normal strength and tone for age. Extremities: No clubbing, cyanosis or edema.   Neuro: Alert and oriented X 3. Psych:  Good affect, responds appropriately  TELEMETRY: Sinus rhythm  ASSESSMENT AND PLAN: Status post non-STEMI and pneumonia with resolved chest pain.  Patient is scheduled for cardiac catheterization and denies chest pain this morning.  Active Problems:   NSTEMI (non-ST elevated myocardial infarction) ([place])    Neoma Laming A, MD, [facility] 07/10/2019 11:34 AM

## 2019-07-10 NOTE — Progress Notes (Signed)
[facility]  ROUNDING NOTE   Subjective:    Patient readmitted from home for SOB Feels better today   Objective:  Vital signs in last 24 hours:  Temp:  [98.2 F (36.8 C)-98.5 F (36.9 C)] 98.4 F (36.9 C) (09/18 1138) Pulse Rate:  [83-110] 88 (09/18 1138) Resp:  [13-20] 16 (09/18 1138) BP: (99-172)/(69-126) 132/81 (09/18 1138) SpO2:  [93 %-100 %] 98 % (09/18 0727) Weight:  [45.6 kg-52.7 kg] 45.6 kg (09/18 0529)  Weight change: 7.3 kg Filed Weights   07/09/19 1550 07/09/19 1558 07/10/19 0529  Weight: 52.7 kg 52.7 kg 45.6 kg    Intake/Output: I/O last 3 completed shifts: In: 1284.7 [P.O.:720; I.V.:155.6; IV Piggyback:409.1] Out: 1871 [Urine:75; [id number]   Intake/Output this shift:  No intake/output data recorded.  Physical Exam: General: NAD, laying in bed  Head: Normocephalic, atraumatic. Moist oral mucosal membranes  Eyes: Anicteric,    Neck: Supple   Lungs:  Clear today, O2 by [place]  Heart: No rub  Abdomen:  Soft, nontender,   Extremities:  no peripheral edema.  Neurologic: Nonfocal, moving all four extremities  Skin: No lesions        Basic Metabolic Panel: Recent Labs  Lab 07/04/19 1053 07/09/19 0247 07/10/19 0630  NA 138 138 139  K 3.4* 3.9 3.6  CL 98 102 101  CO2 27 22 23   GLUCOSE 154* 141* 94  BUN 31* 33* 25*  CREATININE 4.72* 4.44* 3.34*  CALCIUM 8.2* 9.2 8.6*  PHOS  --   --  3.7    Liver Function Tests: Recent Labs  Lab 07/09/19 0247 07/10/19 0630  AST 31  --   ALT 12  --   ALKPHOS 77  --   BILITOT 2.5*  --   PROT 6.7  --   ALBUMIN 2.6* 2.2*   No results for input(s): LIPASE, AMYLASE in the last 168 hours. No results for input(s): AMMONIA in the last 168 hours.  CBC: Recent Labs  Lab 07/04/19 1053 07/09/19 0135 07/10/19 0630  WBC 9.2 15.3* 8.1  HGB 9.4* 11.4* 9.2*  HCT 30.2* 35.9* 29.2*  MCV 106.0* 103.5* 101.7*  PLT 103* 185 165    Cardiac Enzymes: No results for input(s): CKTOTAL, CKMB, CKMBINDEX,  TROPONINI in the last 168 hours.  BNP: Invalid input(s): POCBNP  CBG: No results for input(s): GLUCAP in the last 168 hours.  Microbiology: Results for orders placed or performed during the hospital encounter of 07/09/19  SARS Coronavirus 2 [facility] order, Performed in [facility] hospital lab) Nasopharyngeal Nasopharyngeal Swab     Status: None   Collection Time: 07/09/19  2:16 AM   Specimen: Nasopharyngeal Swab  Result Value Ref Range Status   SARS Coronavirus 2 NEGATIVE NEGATIVE Final    Comment: (NOTE) If result is NEGATIVE SARS-CoV-2 target nucleic acids are NOT DETECTED. The SARS-CoV-2 RNA is generally detectable in upper and lower  respiratory specimens during the acute phase of infection. The lowest  concentration of SARS-CoV-2 viral copies this assay can detect is 250  copies / mL. A negative result does not preclude SARS-CoV-2 infection  and should not be used as the sole basis for treatment or other  patient management decisions.  A negative result may occur with  improper specimen collection / handling, submission of specimen other  than nasopharyngeal swab, presence of viral mutation(s) within the  areas targeted by this assay, and inadequate number of viral copies  (<250 copies / mL). A negative result must be combined with  clinical  observations, patient history, and epidemiological information. If result is POSITIVE SARS-CoV-2 target nucleic acids are DETECTED. The SARS-CoV-2 RNA is generally detectable in upper and lower  respiratory specimens dur ing the acute phase of infection.  Positive  results are indicative of active infection with SARS-CoV-2.  Clinical  correlation with patient history and other diagnostic information is  necessary to determine patient infection status.  Positive results do  not rule out bacterial infection or co-infection with other viruses. If result is PRESUMPTIVE POSTIVE SARS-CoV-2 nucleic acids MAY BE PRESENT.   A presumptive  positive result was obtained on the submitted specimen  and confirmed on repeat testing.  While 2019 novel coronavirus  (SARS-CoV-2) nucleic acids may be present in the submitted sample  additional confirmatory testing may be necessary for epidemiological  and / or clinical management purposes  to differentiate between  SARS-CoV-2 and other Sarbecovirus currently known to infect humans.  If clinically indicated additional testing with an alternate test  methodology [phone]) is advised. The SARS-CoV-2 RNA is generally  detectable in upper and lower respiratory sp ecimens during the acute  phase of infection. The expected result is Negative. Fact Sheet for Patients:  StrictlyIdeas.no Fact Sheet for Healthcare Providers: BankingDealers.co.za This test is not yet approved or cleared by the [place] FDA and has been authorized for detection and/or diagnosis of SARS-CoV-2 by FDA under an Emergency Use Authorization (EUA).  This EUA will remain in effect (meaning this test can be used) for the duration of the COVID-19 declaration under Section 564(b)(1) of the Act, 21 U.S.C. section 360bbb-3(b)(1), unless the authorization is terminated or revoked sooner. Performed at [facility], [address] [place], [place] [address]   Blood culture (routine x 2)     Status: None (Preliminary result)   Collection Time: 07/09/19  2:47 AM   Specimen: BLOOD  Result Value Ref Range Status   Specimen Description BLOOD RIGHT ASSIST CONTROL  Final   Special Requests   Final    BOTTLES DRAWN AEROBIC AND ANAEROBIC Blood Culture adequate volume   Culture   Final    NO GROWTH 1 DAY Performed at [facility], [address] [place], [place] [address]    Report Status PENDING  Incomplete  Blood culture (routine x 2)     Status: None (Preliminary result)   Collection Time: 07/09/19  2:47 AM   Specimen: BLOOD  Result Value Ref Range  Status   Specimen Description BLOOD RIGHT HAND  Final   Special Requests   Final    BOTTLES DRAWN AEROBIC AND ANAEROBIC Blood Culture adequate volume   Culture   Final    NO GROWTH 1 DAY Performed at [facility], [address], [place], [place] [address]    Report Status PENDING  Incomplete  MRSA PCR Screening     Status: None   Collection Time: 07/09/19 11:26 AM   Specimen: Nasal Mucosa; Nasopharyngeal  Result Value Ref Range Status   MRSA by PCR NEGATIVE NEGATIVE Final    Comment:        The GeneXpert MRSA Assay (FDA approved for NASAL specimens only), is one component of a comprehensive MRSA colonization surveillance program. It is not intended to diagnose MRSA infection nor to guide or monitor treatment for MRSA infections. Performed at [facility], [address] [place], [place] [address]     Coagulation Studies: Recent Labs    07/09/19 0441  LABPROT 17.2*  INR 1.4*  Urinalysis: No results for input(s): COLORURINE, LABSPEC, PHURINE, GLUCOSEU, HGBUR, BILIRUBINUR, KETONESUR, PROTEINUR, UROBILINOGEN, NITRITE, LEUKOCYTESUR in the last 72 hours.  Invalid input(s): APPERANCEUR    Imaging: Dg Chest Portable 1 View  Result Date: 07/09/2019 CLINICAL DATA:  43 year old male with shortness of breath. EXAM: PORTABLE CHEST 1 VIEW COMPARISON:  Portable chest 07/02/2019 and earlier. FINDINGS: Portable AP upright view at 0131 hours. Stable cardiomegaly and mediastinal contours. Stable lung volumes. Stable right chest dialysis type catheter. New confluent but indistinct multilobar right lung opacity, similar left perihilar lower lung opacity. No pneumothorax. Left upper lobe pulmonary vascularity appears normal. Visualized tracheal air column is within normal limits. Left upper extremity vascular graft again noted. Stable blunting of the left costophrenic angle. Negative visible bowel gas pattern. IMPRESSION: 1. Acute confluent multilobar right lung  opacity, lesser left perihilar opacity. Favor acute bilateral pneumonia over asymmetric pulmonary edema. 2. Underlying cardiomegaly, right chest dialysis type catheter. Electronically Signed   By: Genevie Ann M.D.   On: 07/09/2019 02:11     Medications:   . heparin 850 Units/hr (07/10/19 0914)   . [MAR Hold] amLODipine  10 mg Oral Q2000  . [MAR Hold] carvedilol  25 mg Oral BID WC  . [MAR Hold] Chlorhexidine Gluconate Cloth  6 each Topical Q0600  . [MAR Hold] docusate sodium  100 mg Oral BID  . [MAR Hold] hydrALAZINE  25 mg Oral Q8H  . [MAR Hold] levofloxacin  750 mg Oral Once   Followed by  . [MAR Hold] levofloxacin  500 mg Oral Q48H  . [MAR Hold] mouth rinse  15 mL Mouth Rinse BID  . [MAR Hold] sevelamer carbonate  1,600 mg Oral TID WC   [MAR Hold] acetaminophen **OR** [MAR Hold] acetaminophen, fentaNYL, iohexol, midazolam, [MAR Hold] ondansetron **OR** [MAR Hold] ondansetron (ZOFRAN) IV  Assessment/ Plan:  [name] is a 43 y.o. white male with end stage renal disease on hemodialysis, hypertension, peripheral vascular disease, depression who is admitted to [facility] on 07/09/2019 for pneumonia  [place]. TTS 46.5 kg Left AVG  1. End Stage Renal Disease:  - Continue TTS schedule - next HD on saturday  2.  SOB - cardiology evaluation in progress - cardiac cath scheduled  3. Anemia of chronic kidney disease:  Lab Results  Component Value Date   HGB 9.2 (L) 07/10/2019    - EPO with TTS treatments.   4. Secondary Hyperparathyroidism:   Lab Results  Component Value Date   PTH 336 (H) 02/04/2019   CALCIUM 8.6 (L) 07/10/2019   CAION 0.91 (L) 12/25/2018   PHOS 3.7 07/10/2019   - sevelamer with meals      LOS: 1 Brett Small 9/18/202012:34 PM

## 2019-07-10 NOTE — Progress Notes (Signed)
Patient returned from cath lab.  R groin access site. C/D/I.  No hematoma noted. Resting in bed.

## 2019-07-10 NOTE — Progress Notes (Signed)
[facility]  ROUNDING NOTE   Subjective:   Patient seen during dialysis Tolerating well   HEMODIALYSIS FLOWSHEET:  Blood Flow Rate (mL/min): 300 mL/min Arterial Pressure (mmHg): -160 mmHg Venous Pressure (mmHg): 90 mmHg Transmembrane Pressure (mmHg): 60 mmHg Ultrafiltration Rate (mL/min): 820 mL/min Dialysate Flow Rate (mL/min): 600 ml/min Conductivity: Machine : 14.1 Conductivity: Machine : 14.1 Dialysis Fluid Bolus: Normal Saline Bolus Amount (mL): 250 mL  Patient readmitted from home for SOB  Objective:  Vital signs in last 24 hours:  Temp:  [98.2 F (36.8 C)-98.5 F (36.9 C)] 98.5 F (36.9 C) (09/18 0727) Pulse Rate:  [83-110] 83 (09/18 0727) Resp:  [13-20] 19 (09/18 0727) BP: (99-172)/(69-126) 129/90 (09/18 0727) SpO2:  [93 %-100 %] 98 % (09/18 0727) Weight:  [45.6 kg-52.7 kg] 45.6 kg (09/18 0529)  Weight change: 7.3 kg Filed Weights   07/09/19 1550 07/09/19 1558 07/10/19 0529  Weight: 52.7 kg 52.7 kg 45.6 kg    Intake/Output: I/O last 3 completed shifts: In: 1284.7 [P.O.:720; I.V.:155.6; IV Piggyback:409.1] Out: 1871 [Urine:75; P4653113   Intake/Output this shift:  No intake/output data recorded.  Physical Exam: General: NAD, laying in bed  Head: Normocephalic, atraumatic. Moist oral mucosal membranes  Eyes: Anicteric,    Neck: Supple   Lungs:  Coarse breath sounds, O2 by [place]  Heart: No rub  Abdomen:  Soft, nontender,   Extremities:  no peripheral edema.  Neurologic: Nonfocal, moving all four extremities  Skin: No lesions        Basic Metabolic Panel: Recent Labs  Lab 07/04/19 1053 07/09/19 0247  NA 138 138  K 3.4* 3.9  CL 98 102  CO2 27 22  GLUCOSE 154* 141*  BUN 31* 33*  CREATININE 4.72* 4.44*  CALCIUM 8.2* 9.2    Liver Function Tests: Recent Labs  Lab 07/09/19 0247  AST 31  ALT 12  ALKPHOS 77  BILITOT 2.5*  PROT 6.7  ALBUMIN 2.6*   No results for input(s): LIPASE, AMYLASE in the last 168 hours. No  results for input(s): AMMONIA in the last 168 hours.  CBC: Recent Labs  Lab 07/04/19 1053 07/09/19 0135 07/10/19 0630  WBC 9.2 15.3* 8.1  HGB 9.4* 11.4* 9.2*  HCT 30.2* 35.9* 29.2*  MCV 106.0* 103.5* 101.7*  PLT 103* 185 165    Cardiac Enzymes: No results for input(s): CKTOTAL, CKMB, CKMBINDEX, TROPONINI in the last 168 hours.  BNP: Invalid input(s): POCBNP  CBG: No results for input(s): GLUCAP in the last 168 hours.  Microbiology: Results for orders placed or performed during the hospital encounter of 07/09/19  SARS Coronavirus 2 [facility] order, Performed in [facility] hospital lab) Nasopharyngeal Nasopharyngeal Swab     Status: None   Collection Time: 07/09/19  2:16 AM   Specimen: Nasopharyngeal Swab  Result Value Ref Range Status   SARS Coronavirus 2 NEGATIVE NEGATIVE Final    Comment: (NOTE) If result is NEGATIVE SARS-CoV-2 target nucleic acids are NOT DETECTED. The SARS-CoV-2 RNA is generally detectable in upper and lower  respiratory specimens during the acute phase of infection. The lowest  concentration of SARS-CoV-2 viral copies this assay can detect is 250  copies / mL. A negative result does not preclude SARS-CoV-2 infection  and should not be used as the sole basis for treatment or other  patient management decisions.  A negative result may occur with  improper specimen collection / handling, submission of specimen other  than nasopharyngeal swab, presence of viral mutation(s) within the  areas targeted  by this assay, and inadequate number of viral copies  (<250 copies / mL). A negative result must be combined with clinical  observations, patient history, and epidemiological information. If result is POSITIVE SARS-CoV-2 target nucleic acids are DETECTED. The SARS-CoV-2 RNA is generally detectable in upper and lower  respiratory specimens dur ing the acute phase of infection.  Positive  results are indicative of active infection with SARS-CoV-2.   Clinical  correlation with patient history and other diagnostic information is  necessary to determine patient infection status.  Positive results do  not rule out bacterial infection or co-infection with other viruses. If result is PRESUMPTIVE POSTIVE SARS-CoV-2 nucleic acids MAY BE PRESENT.   A presumptive positive result was obtained on the submitted specimen  and confirmed on repeat testing.  While 2019 novel coronavirus  (SARS-CoV-2) nucleic acids may be present in the submitted sample  additional confirmatory testing may be necessary for epidemiological  and / or clinical management purposes  to differentiate between  SARS-CoV-2 and other Sarbecovirus currently known to infect humans.  If clinically indicated additional testing with an alternate test  methodology [phone]) is advised. The SARS-CoV-2 RNA is generally  detectable in upper and lower respiratory sp ecimens during the acute  phase of infection. The expected result is Negative. Fact Sheet for Patients:  StrictlyIdeas.no Fact Sheet for Healthcare Providers: BankingDealers.co.za This test is not yet approved or cleared by the [place] FDA and has been authorized for detection and/or diagnosis of SARS-CoV-2 by FDA under an Emergency Use Authorization (EUA).  This EUA will remain in effect (meaning this test can be used) for the duration of the COVID-19 declaration under Section 564(b)(1) of the Act, 21 U.S.C. section 360bbb-3(b)(1), unless the authorization is terminated or revoked sooner. Performed at [facility], [address] [place], [place] [address]   Blood culture (routine x 2)     Status: None (Preliminary result)   Collection Time: 07/09/19  2:47 AM   Specimen: BLOOD  Result Value Ref Range Status   Specimen Description BLOOD RIGHT ASSIST CONTROL  Final   Special Requests   Final    BOTTLES DRAWN AEROBIC AND ANAEROBIC Blood Culture adequate  volume   Culture   Final    NO GROWTH 1 DAY Performed at [facility], [address], [place], [place] [address]    Report Status PENDING  Incomplete  Blood culture (routine x 2)     Status: None (Preliminary result)   Collection Time: 07/09/19  2:47 AM   Specimen: BLOOD  Result Value Ref Range Status   Specimen Description BLOOD RIGHT HAND  Final   Special Requests   Final    BOTTLES DRAWN AEROBIC AND ANAEROBIC Blood Culture adequate volume   Culture   Final    NO GROWTH 1 DAY Performed at [facility], [address] [place], [place] [address]    Report Status PENDING  Incomplete  MRSA PCR Screening     Status: None   Collection Time: 07/09/19 11:26 AM   Specimen: Nasal Mucosa; Nasopharyngeal  Result Value Ref Range Status   MRSA by PCR NEGATIVE NEGATIVE Final    Comment:        The GeneXpert MRSA Assay (FDA approved for NASAL specimens only), is one component of a comprehensive MRSA colonization surveillance program. It is not intended to diagnose MRSA infection nor to guide or monitor treatment for MRSA infections. Performed at [facility], [address], [place], [place] [address]  Coagulation Studies: Recent Labs    07/09/19 0441  LABPROT 17.2*  INR 1.4*    Urinalysis: No results for input(s): COLORURINE, LABSPEC, PHURINE, GLUCOSEU, HGBUR, BILIRUBINUR, KETONESUR, PROTEINUR, UROBILINOGEN, NITRITE, LEUKOCYTESUR in the last 72 hours.  Invalid input(s): APPERANCEUR    Imaging: Dg Chest Portable 1 View  Result Date: 07/09/2019 CLINICAL DATA:  43 year old male with shortness of breath. EXAM: PORTABLE CHEST 1 VIEW COMPARISON:  Portable chest 07/02/2019 and earlier. FINDINGS: Portable AP upright view at 0131 hours. Stable cardiomegaly and mediastinal contours. Stable lung volumes. Stable right chest dialysis type catheter. New confluent but indistinct multilobar right lung opacity, similar left perihilar lower lung opacity. No  pneumothorax. Left upper lobe pulmonary vascularity appears normal. Visualized tracheal air column is within normal limits. Left upper extremity vascular graft again noted. Stable blunting of the left costophrenic angle. Negative visible bowel gas pattern. IMPRESSION: 1. Acute confluent multilobar right lung opacity, lesser left perihilar opacity. Favor acute bilateral pneumonia over asymmetric pulmonary edema. 2. Underlying cardiomegaly, right chest dialysis type catheter. Electronically Signed   By: Genevie Ann M.D.   On: 07/09/2019 02:11     Medications:   . ceFEPime (MAXIPIME) IV Stopped (07/09/19 2223)  . heparin 750 Units/hr (07/10/19 0536)   . amLODipine  10 mg Oral Q2000  . carvedilol  25 mg Oral BID WC  . Chlorhexidine Gluconate Cloth  6 each Topical Q0600  . docusate sodium  100 mg Oral BID  . hydrALAZINE  25 mg Oral Q8H  . mouth rinse  15 mL Mouth Rinse BID  . sevelamer carbonate  1,600 mg Oral TID WC   acetaminophen **OR** acetaminophen, ondansetron **OR** ondansetron (ZOFRAN) IV  Assessment/ Plan:  [name] is a 43 y.o. white male with end stage renal disease on hemodialysis, hypertension, peripheral vascular disease, depression who is admitted to [facility] on 07/09/2019 for pneumonia  [place]. TTS 46.5 kg Left AVG  1. End Stage Renal Disease:  - Continue TTS schedule  2.  SOB - cardiology evaluation in progress  3. Anemia of chronic kidney disease: hemoglobin 9.2.  - EPO with TTS treatments.   4. Secondary Hyperparathyroidism:   Lab Results  Component Value Date   PTH 336 (H) 02/04/2019   CALCIUM 9.2 07/09/2019   CAION 0.91 (L) 12/25/2018   PHOS 3.8 03/01/2019   - sevelamer with meals      LOS: 1 Brett Small 9/18/20207:55 AM

## 2019-07-10 NOTE — Progress Notes (Signed)
ANTICOAGULATION CONSULT NOTE - Initial Consult  Pharmacy Consult for HEPARIN Indication: chest pain/ACS  Allergies  Allergen Reactions  . Codeine Nausea Only    Patient questioned this (??)  . Sulfa Antibiotics Hives and Nausea And Vomiting    Patient Measurements: Height: 5\' 1"  (154.9 cm) Weight: 100 lb 9.6 oz (45.6 kg) IBW/kg (Calculated) : 52.3 HEPARIN DW (KG): 47  Vital Signs: Temp: 98.5 F (36.9 C) (09/18 0727) Temp Source: Oral (09/18 0727) BP: 129/90 (09/18 0727) Pulse Rate: 83 (09/18 0727)  Labs: Recent Labs    07/09/19 0135 07/09/19 0247 07/09/19 0441 07/09/19 1238 07/09/19 1350 07/09/19 2208 07/10/19 0630  HGB 11.4*  --   --   --   --   --  9.2*  HCT 35.9*  --   --   --   --   --  29.2*  PLT 185  --   --   --   --   --  165  APTT  --   --  37* 43*  --   --   --   LABPROT  --   --  17.2*  --   --   --   --   INR  --   --  1.4*  --   --   --   --   HEPARINUNFRC  --   --   --  0.34  --  0.25* 0.15*  CREATININE  --  4.44*  --   --   --   --   --   TROPONINIHS  --  1,159* 1,174*  --  1,240*  --   --    Estimated Creatinine Clearance: 13.6 mL/min (A) (by C-G formula based on SCr of 4.44 mg/dL (H)).  Medical History: Past Medical History:  Diagnosis Date  . ESRD (end stage renal disease) ([place])   . Hypertension   . Renal disorder   . Secondary hyperparathyroidism of renal origin [facility])    Medications:  Medications Prior to Admission  Medication Sig Dispense Refill Last Dose  . amLODipine (NORVASC) 10 MG tablet Take 1 tablet (10 mg total) by mouth daily at 8 pm. 30 tablet 2 Past Week at Unknown  . amoxicillin-clavulanate (AUGMENTIN) 875-125 MG tablet Take 1 tablet by mouth 2 (two) times daily for 7 days. 14 tablet 0 07/08/2019 at 1100  . apixaban (ELIQUIS) 2.5 MG TABS tablet Take 1 tablet (2.5 mg total) by mouth 2 (two) times daily. 60 tablet 1 07/08/2019 at 1100  . aspirin EC 81 MG tablet Take 1 tablet (81 mg total) by mouth daily. 30 tablet 1 07/08/2019 at  1100  . atorvastatin (LIPITOR) 80 MG tablet Take 1 tablet (80 mg total) by mouth at bedtime. 30 tablet 1 Past Week at Unknown  . azithromycin (ZITHROMAX Z-PAK) 250 MG tablet Take 2 tablets (500 mg) on  Day 1,  followed by 1 tablet (250 mg) once daily on Days 2 through 5. 6 each 0 07/08/2019 at 1100  . b complex-C-folic acid 1 MG capsule Take 1 capsule by mouth. With supper.   Past Week at Unknown  . carvedilol (COREG) 25 MG tablet Take 1 tablet (25 mg total) by mouth 2 (two) times daily with a meal. 60 tablet 1 07/08/2019 at 1100  . gabapentin (NEURONTIN) 100 MG capsule Take 1 capsule (100 mg total) by mouth 3 (three) times daily. 90 capsule 1 07/08/2019 at 1100  . hydrALAZINE (APRESOLINE) 25 MG tablet Take 1 tablet (25 mg total)  by mouth every 8 (eight) hours. 90 tablet 1 07/08/2019 at 1100  . hydrOXYzine (ATARAX/VISTARIL) 25 MG tablet Take 25 mg by mouth 3 (three) times daily as needed for anxiety.   prn at prn  . loratadine (CLARITIN) 10 MG tablet Take 1 tablet (10 mg total) by mouth daily. 30 tablet 0 07/08/2019 at 1100  . multivitamin (RENA-VIT) TABS tablet Take 1 tablet by mouth daily. 30 each 1 07/08/2019 at 1100  . neomycin-bacitracin-polymyxin (NEOSPORIN) OINT Apply 1 application topically 2 (two) times daily. 56 g 1 Unknown at Unknown  . sevelamer carbonate (RENVELA) 800 MG tablet Take 2 tablets (1,600 mg total) by mouth 3 (three) times daily with meals. 180 tablet 1 07/08/2019 at 1100  . traMADol (ULTRAM) 50 MG tablet Take 1 tablet (50 mg total) by mouth every 12 (twelve) hours as needed for moderate pain or severe pain. 8 tablet 0 prn at prn  . Nutritional Supplements (FEEDING SUPPLEMENT, NEPRO CARB STEADY,) LIQD Take 237 mLs by mouth 2 (two) times daily between meals. 237 mL 30    Assessment: Pharmacy asked to initiate Heparin for ACS.  Baseline labs ordered, pending.  Pt was on apixaban 2.5mg  BID per PTA med list but last dose taken not recorded.  No baseline Heparin level was ordered. Heparin  level (9/17 @1238 ) does not seem to be falsely elevated. We will monitor heparin with anti-xa levels.   Heparin 2550 units bolus followed by 550 units/hr.  9/17 1238 HL 0.34 aPTT 43. Increased to 650 units/hr 9/17 2208 HL 0.25 9/18 0630 HL 0.15 1300 unit bolus followed by increase to 850 units/hr.    Goal of Therapy:  Heparin level 0.3-0.7 units/ml Monitor platelets by anticoagulation protocol: Yes   Plan:  Heparin level is subtherapeutic. Will increase the infusion rate to 850 units/hr Check HL in 8 hours and CBC daily.  Monitor for s/sx of bleeding complications  Oswald Hillock, PharmD, BCPS 07/10/2019,8:21 AM

## 2019-07-10 NOTE — Consult Note (Signed)
Pharmacy Antibiotic Note  Brett Small is a 43 y.o. male admitted on 07/09/2019 with pneumonia.  Pharmacy has been consulted for Cefepime dosing.  Plan: MRSA PCR negative - d/c'ed vancomycin   Cefepime 2 g on HD days.   Height: 5\' 1"  (154.9 cm) Weight: 100 lb 9.6 oz (45.6 kg) IBW/kg (Calculated) : 52.3  Temp (24hrs), Avg:98.4 F (36.9 C), Min:98.2 F (36.8 C), Max:98.5 F (36.9 C)  Recent Labs  Lab 07/04/19 1053 07/09/19 0135 07/09/19 0247 07/10/19 0630  WBC 9.2 15.3*  --  8.1  CREATININE 4.72*  --  4.44*  --   LATICACIDVEN  --   --  1.7  --     Estimated Creatinine Clearance: 13.6 mL/min (A) (by C-G formula based on SCr of 4.44 mg/dL (H)).    Allergies  Allergen Reactions  . Codeine Nausea Only    Patient questioned this (??)  . Sulfa Antibiotics Hives and Nausea And Vomiting    Antimicrobials this admission: 9/17 cefepime >>  9/17 vancomycin >>   Dose adjustments this admission: None  Microbiology results: 9/17 BCx: pending 9/17 MRSA PCR: negative Thank you for allowing pharmacy to be a part of this patient's care.  Oswald Hillock, PharmD, BCPS 07/10/2019 8:32 AM

## 2019-07-10 NOTE — Consult Note (Signed)
Pharmacy Antibiotic Note  Brett Small is a 43 y.o. male admitted on 07/09/2019 with pneumonia.  Pharmacy has been consulted for levofloxacin dosing.  Plan: MRSA PCR negative - d/c'ed vancomycin  Cefepime 2 g on HD days x 2.   Pharmacy consulted to start levofloxacin. Pt is on HD. Will order levofloxacin 750 mg x 1 followed by 500 mg q48H. Recommend a total of 7 days of therapy. End date: 07/15/2019.    Height: 5\' 1"  (154.9 cm) Weight: 100 lb 9.6 oz (45.6 kg) IBW/kg (Calculated) : 52.3  Temp (24hrs), Avg:98.4 F (36.9 C), Min:98.2 F (36.8 C), Max:98.5 F (36.9 C)  Recent Labs  Lab 07/04/19 1053 07/09/19 0135 07/09/19 0247 07/10/19 0630  WBC 9.2 15.3*  --  8.1  CREATININE 4.72*  --  4.44* 3.34*  LATICACIDVEN  --   --  1.7  --     Estimated Creatinine Clearance: 18 mL/min (A) (by C-G formula based on SCr of 3.34 mg/dL (H)).    Allergies  Allergen Reactions  . Codeine Nausea Only    Patient questioned this (??)  . Sulfa Antibiotics Hives and Nausea And Vomiting    Antimicrobials this admission: 9/17 cefepime >> 9/17 9/17 vancomycin >> 9/17  Dose adjustments this admission: None  Microbiology results: 9/17 BCx: pending 9/17 MRSA PCR: negative  Thank you for allowing pharmacy to be a part of this patient's care.  Oswald Hillock, PharmD, BCPS 07/10/2019 10:04 AM

## 2019-07-11 LAB — PROCALCITONIN: Procalcitonin: 0.76 ng/mL

## 2019-07-11 LAB — GLUCOSE, CAPILLARY: Glucose-Capillary: 98 mg/dL (ref 70–99)

## 2019-07-11 LAB — BASIC METABOLIC PANEL
Anion gap: 11 (ref 5–15)
BUN: 39 mg/dL — ABNORMAL HIGH (ref 6–20)
CO2: 25 mmol/L (ref 22–32)
Calcium: 9 mg/dL (ref 8.9–10.3)
Chloride: 99 mmol/L (ref 98–111)
Creatinine, Ser: 4.12 mg/dL — ABNORMAL HIGH (ref 0.61–1.24)
GFR calc Af Amer: 19 mL/min — ABNORMAL LOW (ref >60)
GFR calc non Af Amer: 16 mL/min — ABNORMAL LOW (ref >60)
Glucose, Bld: 103 mg/dL — ABNORMAL HIGH (ref 70–99)
Potassium: 3.9 mmol/L (ref 3.5–5.1)
Sodium: 135 mmol/L (ref 135–145)

## 2019-07-11 LAB — CBC
HCT: 27.7 % — ABNORMAL LOW (ref 39.0–52.0)
Hemoglobin: 8.9 g/dL — ABNORMAL LOW (ref 13.0–17.0)
MCH: 31.9 pg (ref 26.0–34.0)
MCHC: 32.1 g/dL (ref 30.0–36.0)
MCV: 99.3 fL (ref 80.0–100.0)
Platelets: 195 10*3/uL (ref 150–400)
RBC: 2.79 MIL/uL — ABNORMAL LOW (ref 4.22–5.81)
RDW: 16 % — ABNORMAL HIGH (ref 11.5–15.5)
WBC: 9.1 10*3/uL (ref 4.0–10.5)
nRBC: 0 % (ref 0.0–0.2)

## 2019-07-11 MED ORDER — GABAPENTIN 100 MG PO CAPS: 100.0000 mg | ORAL_CAPSULE | Freq: Three times a day (TID) | ORAL | Status: DC

## 2019-07-11 MED ORDER — GABAPENTIN 100 MG PO CAPS
100.0000 mg | ORAL_CAPSULE | Freq: Three times a day (TID) | ORAL | Status: DC
  Administered 2019-07-11 – 2019-07-12 (×5): 100 mg via ORAL
  Filled 2019-07-11 (×5): qty 1

## 2019-07-11 MED ORDER — PROCHLORPERAZINE EDISYLATE 10 MG/2ML IJ SOLN
10.0000 mg | Freq: Four times a day (QID) | INTRAMUSCULAR | Status: DC | PRN
  Administered 2019-07-11: 10 mg via INTRAVENOUS
  Filled 2019-07-11 (×2): qty 2

## 2019-07-11 NOTE — Plan of Care (Signed)
  Problem: Clinical Measurements: Goal: Ability to maintain clinical measurements within normal limits will improve Outcome: Progressing Goal: Respiratory complications will improve Outcome: Progressing Note: Patient weaned to room air   Goal: Cardiovascular complication will be avoided Outcome: Progressing

## 2019-07-11 NOTE — Progress Notes (Signed)
   07/11/19 1315  Vital Signs  Temp 98.4 F (36.9 C)  Temp Source Oral  Pulse Rate 93  Pulse Rate Source Monitor  Resp 18  BP 121/82  BP Location Right Arm  BP Method Automatic  Patient Position (if appropriate) Lying  Oxygen Therapy  SpO2 97 %  O2 Device Room Air  Pain Assessment  Pain Scale 0-10  Pain Score 0  During Hemodialysis Assessment  Blood Flow Rate (mL/min) 400 mL/min  Arterial Pressure (mmHg) -200 mmHg  Venous Pressure (mmHg) 120 mmHg  Transmembrane Pressure (mmHg) 60 mmHg  Ultrafiltration Rate (mL/min) 470 mL/min  Dialysate Flow Rate (mL/min) 600 ml/min  Conductivity: Machine  14  HD Safety Checks Performed Yes  Dialysis Fluid Bolus Normal Saline  Bolus Amount (mL) 250 mL  Intra-Hemodialysis Comments Tx completed  Post-Hemodialysis Assessment  Rinseback Volume (mL) 500 mL  KECN 66.3 V  Dialyzer Clearance Clear  Duration of HD Treatment -hour(s) 3 hour(s)  Hemodialysis Intake (mL) 500 mL  UF Total -Machine (mL) 2000 mL  Net UF (mL) 1500 mL  Tolerated HD Treatment Yes  pt tolerated tx well no c/os vital stable cvc WDL functioned well UFG 2039ml

## 2019-07-11 NOTE — Progress Notes (Signed)
[facility] [place] NAME: Gwendolyn Carthan    MR#:  [id number]  DATE OF BIRTH:  April 28, 1976  SUBJECTIVE:   Patient is feeling a little better today. He denies any chest pain. He endorses orthopnea. Still feeling short of breath when he walks to the bathroom. Does not feel ready to go home today.  REVIEW OF SYSTEMS:  Review of Systems  Constitutional: Negative for chills and fever.  Respiratory: Positive for cough and shortness of breath. Negative for wheezing.   Cardiovascular: Positive for leg swelling. Negative for chest pain and palpitations.  Gastrointestinal: Negative for abdominal pain, constipation, diarrhea, nausea and vomiting.  Musculoskeletal: Negative for joint pain.  Neurological: Negative for loss of consciousness and weakness.    DRUG ALLERGIES:   Allergies  Allergen Reactions  . Codeine Nausea Only    Patient questioned this (??)  . Sulfa Antibiotics Hives and Nausea And Vomiting   VITALS:  Blood pressure 114/67, pulse 94, temperature 98 F (36.7 C), temperature source Oral, resp. rate 14, height 5\' 1"  (1.549 m), weight 46.5 kg, SpO2 92 %. PHYSICAL EXAMINATION:  Physical Exam  GENERAL:  Laying in the bed with no acute distress. Thin-appearing. HEENT: Head atraumatic, normocephalic. Pupils equal, round, reactive to light and accommodation. No scleral icterus. Extraocular muscles intact. Oropharynx and nasopharynx clear.  NECK:  Supple, no jugular venous distention. No thyroid enlargement. LUNGS: +diminished breath sounds in the lung bases bilaterally. No use of accessory muscles of respiration.  CARDIOVASCULAR: RRR, S1, S2 normal. No murmurs, rubs, or gallops.  ABDOMEN: Soft, nontender, nondistended. Bowel sounds present.  EXTREMITIES: No pedal edema, cyanosis, or clubbing.  NEUROLOGIC: CN 2-12 intact, no focal deficits. 5/5 muscle strength throughout all extremities. Sensation intact throughout. Gait not checked.   PSYCHIATRIC: The patient is alert and oriented x 3.  SKIN: No obvious rash, lesion, or ulcer.  LABORATORY PANEL:  Male CBC Recent Labs  Lab 07/11/19 0453  WBC 9.1  HGB 8.9*  HCT 27.7*  PLT 195   ------------------------------------------------------------------------------------------------------------------ Chemistries  Recent Labs  Lab 07/09/19 0247  07/11/19 0453  NA 138   < > 135  K 3.9   < > 3.9  CL 102   < > 99  CO2 22   < > 25  GLUCOSE 141*   < > 103*  BUN 33*   < > 39*  CREATININE 4.44*   < > 4.12*  CALCIUM 9.2   < > 9.0  AST 31  --   --   ALT 12  --   --   ALKPHOS 77  --   --   BILITOT 2.5*  --   --    < > = values in this interval not displayed.   RADIOLOGY:  No results found. ASSESSMENT AND PLAN:   HAP- improving. Initially with acute hypoxic respiratory failure, but this resolved.  -Continue levaquin for a total 7 day course -Will plan to ambulate with pulse ox prior to discharge  Elevated troponin- initially felt to be NSTEMI but cardiac cath 9/18 with normal coronaries -Continue home beta blocker   Hypertension- BP well-controlled -Continue home coreg, norvasc, and hydralazine  ESRD on HD TTS -Nephrology consulted -Continue home renvela  Macrocyctic Anemia- stable. Folate and B12 normal. -Monitor  Plan for likely discharge home tomorrow.  All the records are reviewed and case discussed with Care Management/Social Worker. Management plans discussed with the patient, family and they are in agreement.  CODE  STATUS: Full Code  TOTAL TIME TAKING CARE OF THIS PATIENT: 40 minutes.   More than 50% of the time was spent in counseling/coordination of care: YES  POSSIBLE D/C IN 2-3 DAYS, DEPENDING ON CLINICAL CONDITION.   Berna Spare Jihan Rudy M.D on 07/11/2019 at 12:25 PM  Between 7am to 6pm - Pager - [phone]  After 6pm go to [email or web address] - Proofreader  [facility]  Office  [phone]  CC: Primary care  physician; Patient, [name]  Note: This dictation was prepared with Dragon dictation along with smaller phrase technology. Any transcriptional errors that result from this process are unintentional.

## 2019-07-11 NOTE — Progress Notes (Signed)
Patient ambulated in room on room air. Oxygen saturation dropped to 96% on room air and increased to 99% while sitting.

## 2019-07-11 NOTE — Progress Notes (Signed)
   07/11/19 0958  Neurological  Level of Consciousness Alert  Orientation Level Oriented X4  Respiratory  Respiratory Pattern Regular;Unlabored  Cardiac  Pulse Regular  ECG Monitor Yes  Cardiac Rhythm NSR  Pt arrived to unit at 0954 via bed with transport no c/os no distress noted. Telemetry in use being transferred to HD. UFG 2018ml VS stable CVC WDL

## 2019-07-11 NOTE — Plan of Care (Signed)
  Problem: Clinical Measurements: Goal: Ability to maintain clinical measurements within normal limits will improve Outcome: Progressing Goal: Respiratory complications will improve Outcome: Progressing Goal: Cardiovascular complication will be avoided Outcome: Progressing   Problem: Nutrition: Goal: Adequate nutrition will be maintained Outcome: Progressing   Problem: Elimination: Goal: Will not experience complications related to urinary retention Outcome: Progressing   Problem: Safety: Goal: Ability to remain free from injury will improve Outcome: Progressing   Problem: Activity: Goal: Ability to tolerate increased activity will improve Outcome: Progressing   Problem: Cardiac: Goal: Ability to achieve and maintain adequate cardiopulmonary perfusion will improve Outcome: Progressing Goal: Vascular access site(s) Level 0-1 will be maintained Outcome: Progressing   Problem: Cardiovascular: Goal: Vascular access site(s) Level 0-1 will be maintained Outcome: Progressing

## 2019-07-11 NOTE — Progress Notes (Signed)
[facility]  ROUNDING NOTE   Subjective:    Patient readmitted from home for SOB Feels better today.  Currently on room air Seen during dialysis, tolerating well   HEMODIALYSIS FLOWSHEET:  Blood Flow Rate (mL/min): 400 mL/min Arterial Pressure (mmHg): -200 mmHg Venous Pressure (mmHg): 120 mmHg Transmembrane Pressure (mmHg): 60 mmHg Ultrafiltration Rate (mL/min): 470 mL/min Dialysate Flow Rate (mL/min): 600 ml/min Conductivity: Machine : 13.8 Conductivity: Machine : 13.8 Dialysis Fluid Bolus: Normal Saline Bolus Amount (mL): 250 mL     Objective:  Vital signs in last 24 hours:  Temp:  [97.6 F (36.4 C)-98.3 F (36.8 C)] 98 F (36.7 C) (09/19 1000) Pulse Rate:  [83-99] 95 (09/19 1300) Resp:  [12-21] 16 (09/19 1300) BP: (101-136)/(67-100) 122/84 (09/19 1300) SpO2:  [92 %-100 %] 97 % (09/19 1300) Weight:  [46.5 kg] 46.5 kg (09/19 0337)  Weight change: -6.206 kg Filed Weights   07/09/19 1558 07/10/19 0529 07/11/19 0337  Weight: 52.7 kg 45.6 kg 46.5 kg    Intake/Output: I/O last 3 completed shifts: In: 774.7 [P.O.:600; I.V.:74.2; IV Piggyback:100.5] Out: 1891 [Urine:95; [id number]   Intake/Output this shift:  No intake/output data recorded.  Physical Exam: General: NAD, laying in bed  Head: Normocephalic, atraumatic. Moist oral mucosal membranes  Eyes: Anicteric,    Neck: Supple   Lungs:  Clear today, room air  Heart: No rub  Abdomen:  Soft, nontender,   Extremities:  no peripheral edema.  Neurologic: Nonfocal, moving all four extremities  Skin: No lesions        Basic Metabolic Panel: Recent Labs  Lab 07/09/19 0247 07/10/19 0630 07/11/19 0453  NA 138 139 135  K 3.9 3.6 3.9  CL 102 101 99  CO2 22 23 25   GLUCOSE 141* 94 103*  BUN 33* 25* 39*  CREATININE 4.44* 3.34* 4.12*  CALCIUM 9.2 8.6* 9.0  PHOS  --  3.7  --     Liver Function Tests: Recent Labs  Lab 07/09/19 0247 07/10/19 0630  AST 31  --   ALT 12  --   ALKPHOS 77   --   BILITOT 2.5*  --   PROT 6.7  --   ALBUMIN 2.6* 2.2*   No results for input(s): LIPASE, AMYLASE in the last 168 hours. No results for input(s): AMMONIA in the last 168 hours.  CBC: Recent Labs  Lab 07/09/19 0135 07/10/19 0630 07/11/19 0453  WBC 15.3* 8.1 9.1  HGB 11.4* 9.2* 8.9*  HCT 35.9* 29.2* 27.7*  MCV 103.5* 101.7* 99.3  PLT 185 165 195    Cardiac Enzymes: No results for input(s): CKTOTAL, CKMB, CKMBINDEX, TROPONINI in the last 168 hours.  BNP: Invalid input(s): POCBNP  CBG: Recent Labs  Lab 07/11/19 0001  GLUCAP 98    Microbiology: Results for orders placed or performed during the hospital encounter of 07/09/19  SARS Coronavirus 2 [facility] order, Performed in [facility] hospital lab) Nasopharyngeal Nasopharyngeal Swab     Status: None   Collection Time: 07/09/19  2:16 AM   Specimen: Nasopharyngeal Swab  Result Value Ref Range Status   SARS Coronavirus 2 NEGATIVE NEGATIVE Final    Comment: (NOTE) If result is NEGATIVE SARS-CoV-2 target nucleic acids are NOT DETECTED. The SARS-CoV-2 RNA is generally detectable in upper and lower  respiratory specimens during the acute phase of infection. The lowest  concentration of SARS-CoV-2 viral copies this assay can detect is 250  copies / mL. A negative result does not preclude SARS-CoV-2 infection  and should  not be used as the sole basis for treatment or other  patient management decisions.  A negative result may occur with  improper specimen collection / handling, submission of specimen other  than nasopharyngeal swab, presence of viral mutation(s) within the  areas targeted by this assay, and inadequate number of viral copies  (<250 copies / mL). A negative result must be combined with clinical  observations, patient history, and epidemiological information. If result is POSITIVE SARS-CoV-2 target nucleic acids are DETECTED. The SARS-CoV-2 RNA is generally detectable in upper and lower  respiratory  specimens dur ing the acute phase of infection.  Positive  results are indicative of active infection with SARS-CoV-2.  Clinical  correlation with patient history and other diagnostic information is  necessary to determine patient infection status.  Positive results do  not rule out bacterial infection or co-infection with other viruses. If result is PRESUMPTIVE POSTIVE SARS-CoV-2 nucleic acids MAY BE PRESENT.   A presumptive positive result was obtained on the submitted specimen  and confirmed on repeat testing.  While 2019 novel coronavirus  (SARS-CoV-2) nucleic acids may be present in the submitted sample  additional confirmatory testing may be necessary for epidemiological  and / or clinical management purposes  to differentiate between  SARS-CoV-2 and other Sarbecovirus currently known to infect humans.  If clinically indicated additional testing with an alternate test  methodology [phone]) is advised. The SARS-CoV-2 RNA is generally  detectable in upper and lower respiratory sp ecimens during the acute  phase of infection. The expected result is Negative. Fact Sheet for Patients:  StrictlyIdeas.no Fact Sheet for Healthcare Providers: BankingDealers.co.za This test is not yet approved or cleared by the [place] FDA and has been authorized for detection and/or diagnosis of SARS-CoV-2 by FDA under an Emergency Use Authorization (EUA).  This EUA will remain in effect (meaning this test can be used) for the duration of the COVID-19 declaration under Section 564(b)(1) of the Act, 21 U.S.C. section 360bbb-3(b)(1), unless the authorization is terminated or revoked sooner. Performed at [facility], [address] [place], [place] [address]   Blood culture (routine x 2)     Status: None (Preliminary result)   Collection Time: 07/09/19  2:47 AM   Specimen: BLOOD  Result Value Ref Range Status   Specimen Description BLOOD  RIGHT ASSIST CONTROL  Final   Special Requests   Final    BOTTLES DRAWN AEROBIC AND ANAEROBIC Blood Culture adequate volume   Culture   Final    NO GROWTH 2 DAYS Performed at [facility], [address], [place], [place] [address]    Report Status PENDING  Incomplete  Blood culture (routine x 2)     Status: None (Preliminary result)   Collection Time: 07/09/19  2:47 AM   Specimen: BLOOD  Result Value Ref Range Status   Specimen Description BLOOD RIGHT HAND  Final   Special Requests   Final    BOTTLES DRAWN AEROBIC AND ANAEROBIC Blood Culture adequate volume   Culture   Final    NO GROWTH 2 DAYS Performed at [facility], [address], [place], [place] [address]    Report Status PENDING  Incomplete  MRSA PCR Screening     Status: None   Collection Time: 07/09/19 11:26 AM   Specimen: Nasal Mucosa; Nasopharyngeal  Result Value Ref Range Status   MRSA by PCR NEGATIVE NEGATIVE Final    Comment:        The GeneXpert MRSA Assay (  FDA approved for NASAL specimens only), is one component of a comprehensive MRSA colonization surveillance program. It is not intended to diagnose MRSA infection nor to guide or monitor treatment for MRSA infections. Performed at [facility], [address] [place], [place] [address]     Coagulation Studies: Recent Labs    07/09/19 0441  LABPROT 17.2*  INR 1.4*    Urinalysis: No results for input(s): COLORURINE, LABSPEC, PHURINE, GLUCOSEU, HGBUR, BILIRUBINUR, KETONESUR, PROTEINUR, UROBILINOGEN, NITRITE, LEUKOCYTESUR in the last 72 hours.  Invalid input(s): APPERANCEUR    Imaging: No results found.   Medications:   . sodium chloride     . amLODipine  10 mg Oral Q2000  . carvedilol  25 mg Oral BID WC  . Chlorhexidine Gluconate Cloth  6 each Topical Q0600  . docusate sodium  100 mg Oral BID  . gabapentin  100 mg Oral TID  . hydrALAZINE  25 mg Oral Q8H  . [START ON 07/12/2019] levofloxacin  500 mg Oral  Q48H  . mouth rinse  15 mL Mouth Rinse BID  . sevelamer carbonate  1,600 mg Oral TID WC  . sodium chloride flush  3 mL Intravenous Q12H  . sodium chloride flush  3 mL Intravenous Q12H   sodium chloride, acetaminophen **OR** acetaminophen, acetaminophen, ondansetron **OR** ondansetron (ZOFRAN) IV, ondansetron (ZOFRAN) IV, prochlorperazine, sodium chloride flush  Assessment/ Plan:  [name] is a 43 y.o. white male with end stage renal disease on hemodialysis, hypertension, peripheral vascular disease, depression who is admitted to [facility] on 07/09/2019 for pneumonia  [place]. TTS 46.5 kg Left AVG  1. End Stage Renal Disease:  - Continue TTS schedule  2.  SOB - cardiology evaluation in progress - cardiac cath 9/19: normal coronaries  3. Anemia of chronic kidney disease:  Lab Results  Component Value Date   HGB 8.9 (L) 07/11/2019    - EPO with TTS treatments.   4. Secondary Hyperparathyroidism:   Lab Results  Component Value Date   PTH 336 (H) 02/04/2019   CALCIUM 9.0 07/11/2019   CAION 0.91 (L) 12/25/2018   PHOS 3.7 07/10/2019  - sevelamer with meals      LOS: 2 Brett Small 9/19/20201:32 PM

## 2019-07-11 NOTE — Plan of Care (Signed)
  Problem: Clinical Measurements: Goal: Cardiovascular complication will be avoided Outcome: Progressing   Problem: Clinical Measurements: Goal: Ability to maintain clinical measurements within normal limits will improve Outcome: Progressing Goal: Respiratory complications will improve Outcome: Progressing Note: Patient weaned to room air   Goal: Cardiovascular complication will be avoided Outcome: Progressing   Problem: Cardiac: Goal: Ability to achieve and maintain adequate cardiopulmonary perfusion will improve Outcome: Progressing Goal: Vascular access site(s) Level 0-1 will be maintained Outcome: Progressing

## 2019-07-12 LAB — RENAL FUNCTION PANEL
Albumin: 2.3 g/dL — ABNORMAL LOW (ref 3.5–5.0)
Anion gap: 10 (ref 5–15)
BUN: 26 mg/dL — ABNORMAL HIGH (ref 6–20)
CO2: 27 mmol/L (ref 22–32)
Calcium: 9 mg/dL (ref 8.9–10.3)
Chloride: 101 mmol/L (ref 98–111)
Creatinine, Ser: 3.01 mg/dL — ABNORMAL HIGH (ref 0.61–1.24)
GFR calc Af Amer: 28 mL/min — ABNORMAL LOW (ref >60)
GFR calc non Af Amer: 24 mL/min — ABNORMAL LOW (ref >60)
Glucose, Bld: 88 mg/dL (ref 70–99)
Phosphorus: 3.9 mg/dL (ref 2.5–4.6)
Potassium: 4.1 mmol/L (ref 3.5–5.1)
Sodium: 138 mmol/L (ref 135–145)

## 2019-07-12 MED ORDER — LEVOFLOXACIN 500 MG PO TABS: ORAL_TABLET | ORAL | 0 refills | Status: DC

## 2019-07-12 NOTE — Progress Notes (Signed)
[facility]  ROUNDING NOTE   Subjective:    Patient readmitted from home for SOB Feels better today.  Currently on room air States he is able to eat without any problems   Objective:  Vital signs in last 24 hours:  Temp:  [97.6 F (36.4 C)-98.4 F (36.9 C)] 97.6 F (36.4 C) (09/20 0748) Pulse Rate:  [88-104] 104 (09/20 0748) Resp:  [14-21] 18 (09/20 0748) BP: (115-144)/(68-104) 138/93 (09/20 0748) SpO2:  [92 %-98 %] 96 % (09/20 0748) Weight:  [45.2 kg-45.4 kg] 45.4 kg (09/20 0431)  Weight change: -1.27 kg Filed Weights   07/11/19 0337 07/11/19 1434 07/12/19 0431  Weight: 46.5 kg 45.2 kg 45.4 kg    Intake/Output: I/O last 3 completed shifts: In: -  Out: 1520 [Urine:20; Other:1500]   Intake/Output this shift:  Total I/O In: 480 [P.O.:480] Out: 0   Physical Exam: General: NAD, laying in bed  Head: Normocephalic, atraumatic. Moist oral mucosal membranes  Eyes: Anicteric,    Neck: Supple   Lungs:  Clear today, room air  Heart: No rub  Abdomen:  Soft, nontender,   Extremities:  no peripheral edema.  Neurologic: Nonfocal, moving all four extremities  Skin: No lesions        Basic Metabolic Panel: Recent Labs  Lab 07/09/19 0247 07/10/19 0630 07/11/19 0453 07/12/19 0426  NA 138 139 135 138  K 3.9 3.6 3.9 4.1  CL 102 101 99 101  CO2 22 23 25 27   GLUCOSE 141* 94 103* 88  BUN 33* 25* 39* 26*  CREATININE 4.44* 3.34* 4.12* 3.01*  CALCIUM 9.2 8.6* 9.0 9.0  PHOS  --  3.7  --  3.9    Liver Function Tests: Recent Labs  Lab 07/09/19 0247 07/10/19 0630 07/12/19 0426  AST 31  --   --   ALT 12  --   --   ALKPHOS 77  --   --   BILITOT 2.5*  --   --   PROT 6.7  --   --   ALBUMIN 2.6* 2.2* 2.3*   No results for input(s): LIPASE, AMYLASE in the last 168 hours. No results for input(s): AMMONIA in the last 168 hours.  CBC: Recent Labs  Lab 07/09/19 0135 07/10/19 0630 07/11/19 0453  WBC 15.3* 8.1 9.1  HGB 11.4* 9.2* 8.9*  HCT 35.9* 29.2*  27.7*  MCV 103.5* 101.7* 99.3  PLT 185 165 195    Cardiac Enzymes: No results for input(s): CKTOTAL, CKMB, CKMBINDEX, TROPONINI in the last 168 hours.  BNP: Invalid input(s): POCBNP  CBG: Recent Labs  Lab 07/11/19 0001  GLUCAP 98    Microbiology: Results for orders placed or performed during the hospital encounter of 07/09/19  SARS Coronavirus 2 [facility] order, Performed in [facility] hospital lab) Nasopharyngeal Nasopharyngeal Swab     Status: None   Collection Time: 07/09/19  2:16 AM   Specimen: Nasopharyngeal Swab  Result Value Ref Range Status   SARS Coronavirus 2 NEGATIVE NEGATIVE Final    Comment: (NOTE) If result is NEGATIVE SARS-CoV-2 target nucleic acids are NOT DETECTED. The SARS-CoV-2 RNA is generally detectable in upper and lower  respiratory specimens during the acute phase of infection. The lowest  concentration of SARS-CoV-2 viral copies this assay can detect is 250  copies / mL. A negative result does not preclude SARS-CoV-2 infection  and should not be used as the sole basis for treatment or other  patient management decisions.  A negative result may occur with  improper specimen collection / handling, submission of specimen other  than nasopharyngeal swab, presence of viral mutation(s) within the  areas targeted by this assay, and inadequate number of viral copies  (<250 copies / mL). A negative result must be combined with clinical  observations, patient history, and epidemiological information. If result is POSITIVE SARS-CoV-2 target nucleic acids are DETECTED. The SARS-CoV-2 RNA is generally detectable in upper and lower  respiratory specimens dur ing the acute phase of infection.  Positive  results are indicative of active infection with SARS-CoV-2.  Clinical  correlation with patient history and other diagnostic information is  necessary to determine patient infection status.  Positive results do  not rule out bacterial infection or co-infection  with other viruses. If result is PRESUMPTIVE POSTIVE SARS-CoV-2 nucleic acids MAY BE PRESENT.   A presumptive positive result was obtained on the submitted specimen  and confirmed on repeat testing.  While 2019 novel coronavirus  (SARS-CoV-2) nucleic acids may be present in the submitted sample  additional confirmatory testing may be necessary for epidemiological  and / or clinical management purposes  to differentiate between  SARS-CoV-2 and other Sarbecovirus currently known to infect humans.  If clinically indicated additional testing with an alternate test  methodology [phone]) is advised. The SARS-CoV-2 RNA is generally  detectable in upper and lower respiratory sp ecimens during the acute  phase of infection. The expected result is Negative. Fact Sheet for Patients:  StrictlyIdeas.no Fact Sheet for Healthcare Providers: BankingDealers.co.za This test is not yet approved or cleared by the [place] FDA and has been authorized for detection and/or diagnosis of SARS-CoV-2 by FDA under an Emergency Use Authorization (EUA).  This EUA will remain in effect (meaning this test can be used) for the duration of the COVID-19 declaration under Section 564(b)(1) of the Act, 21 U.S.C. section 360bbb-3(b)(1), unless the authorization is terminated or revoked sooner. Performed at [facility], [address] [place], [place] [address]   Blood culture (routine x 2)     Status: None (Preliminary result)   Collection Time: 07/09/19  2:47 AM   Specimen: BLOOD  Result Value Ref Range Status   Specimen Description BLOOD RIGHT ASSIST CONTROL  Final   Special Requests   Final    BOTTLES DRAWN AEROBIC AND ANAEROBIC Blood Culture adequate volume   Culture   Final    NO GROWTH 3 DAYS Performed at [facility], [address], [place], [place] [address]    Report Status PENDING  Incomplete  Blood culture (routine x 2)      Status: None (Preliminary result)   Collection Time: 07/09/19  2:47 AM   Specimen: BLOOD  Result Value Ref Range Status   Specimen Description BLOOD RIGHT HAND  Final   Special Requests   Final    BOTTLES DRAWN AEROBIC AND ANAEROBIC Blood Culture adequate volume   Culture   Final    NO GROWTH 3 DAYS Performed at [facility], [address] [place], [place] [address]    Report Status PENDING  Incomplete  MRSA PCR Screening     Status: None   Collection Time: 07/09/19 11:26 AM   Specimen: Nasal Mucosa; Nasopharyngeal  Result Value Ref Range Status   MRSA by PCR NEGATIVE NEGATIVE Final    Comment:        The GeneXpert MRSA Assay (FDA approved for NASAL specimens only), is one component of a comprehensive MRSA colonization surveillance program. It is not intended to diagnose MRSA  infection nor to guide or monitor treatment for MRSA infections. Performed at [facility], [address] [place], [place] [address]     Coagulation Studies: No results for input(s): LABPROT, INR in the last 72 hours.  Urinalysis: No results for input(s): COLORURINE, LABSPEC, PHURINE, GLUCOSEU, HGBUR, BILIRUBINUR, KETONESUR, PROTEINUR, UROBILINOGEN, NITRITE, LEUKOCYTESUR in the last 72 hours.  Invalid input(s): APPERANCEUR    Imaging: No results found.   Medications:   . sodium chloride     . amLODipine  10 mg Oral Q2000  . carvedilol  25 mg Oral BID WC  . Chlorhexidine Gluconate Cloth  6 each Topical Q0600  . docusate sodium  100 mg Oral BID  . gabapentin  100 mg Oral TID  . hydrALAZINE  25 mg Oral Q8H  . levofloxacin  500 mg Oral Q48H  . mouth rinse  15 mL Mouth Rinse BID  . sevelamer carbonate  1,600 mg Oral TID WC  . sodium chloride flush  3 mL Intravenous Q12H  . sodium chloride flush  3 mL Intravenous Q12H   sodium chloride, acetaminophen **OR** acetaminophen, acetaminophen, ondansetron **OR** ondansetron (ZOFRAN) IV, ondansetron (ZOFRAN) IV,  prochlorperazine, sodium chloride flush  Assessment/ Plan:  [name] is a 43 y.o. white male with end stage renal disease on hemodialysis, hypertension, peripheral vascular disease, depression who is admitted to [facility] on 07/09/2019 for pneumonia  [place]. TTS 46.5 kg Left AVG  1. End Stage Renal Disease:  - Continue outpatient dialysis on normal TTS schedule  2.  SOB - cardiology evaluation in progress - cardiac cath 9/19: normal coronaries  3. Anemia of chronic kidney disease:  Lab Results  Component Value Date   HGB 8.9 (L) 07/11/2019    - EPO with TTS treatments.   4. Secondary Hyperparathyroidism:   Lab Results  Component Value Date   PTH 336 (H) 02/04/2019   CALCIUM 9.0 07/12/2019   CAION 0.91 (L) 12/25/2018   PHOS 3.9 07/12/2019  - sevelamer with meals      LOS: 3 Brett Small 9/20/202012:12 PM

## 2019-07-12 NOTE — Progress Notes (Signed)
Brett Small to be D/C'd Home per MD order.  Discussed prescriptions and follow up appointments with the patient. Prescriptions given to patient, medication list explained in detail. Pt verbalized understanding.  Allergies as of 07/12/2019      Reactions   Codeine Nausea Only   Patient questioned this (??)   Sulfa Antibiotics Hives, Nausea And Vomiting      Medication List    STOP taking these medications   amoxicillin-clavulanate 875-125 MG tablet Commonly known as: Augmentin   azithromycin 250 MG tablet Commonly known as: Zithromax Z-Pak     TAKE these medications   amLODipine 10 MG tablet Commonly known as: NORVASC Take 1 tablet (10 mg total) by mouth daily at 8 pm.   apixaban 2.5 MG Tabs tablet Commonly known as: ELIQUIS Take 1 tablet (2.5 mg total) by mouth 2 (two) times daily.   aspirin EC 81 MG tablet Take 1 tablet (81 mg total) by mouth daily.   atorvastatin 80 MG tablet Commonly known as: LIPITOR Take 1 tablet (80 mg total) by mouth at bedtime.   carvedilol 25 MG tablet Commonly known as: COREG Take 1 tablet (25 mg total) by mouth 2 (two) times daily with a meal.   feeding supplement (NEPRO CARB STEADY) Liqd Take 237 mLs by mouth 2 (two) times daily between meals.   gabapentin 100 MG capsule Commonly known as: NEURONTIN Take 1 capsule (100 mg total) by mouth 3 (three) times daily.   hydrALAZINE 25 MG tablet Commonly known as: APRESOLINE Take 1 tablet (25 mg total) by mouth every 8 (eight) hours.   hydrOXYzine 25 MG tablet Commonly known as: ATARAX/VISTARIL Take 25 mg by mouth 3 (three) times daily as needed for anxiety.   levofloxacin 500 MG tablet Commonly known as: LEVAQUIN Take 1 tablet (500mg ) by mouth on 9/22 and 9/24 AFTER dialysis   loratadine 10 MG tablet Commonly known as: CLARITIN Take 1 tablet (10 mg total) by mouth daily.   b complex-C-folic acid 1 MG capsule Take 1 capsule by mouth. With supper.   multivitamin Tabs tablet Take 1  tablet by mouth daily.   neomycin-bacitracin-polymyxin Oint Commonly known as: NEOSPORIN Apply 1 application topically 2 (two) times daily.   sevelamer carbonate 800 MG tablet Commonly known as: RENVELA Take 2 tablets (1,600 mg total) by mouth 3 (three) times daily with meals.   traMADol 50 MG tablet Commonly known as: ULTRAM Take 1 tablet (50 mg total) by mouth every 12 (twelve) hours as needed for moderate pain or severe pain.       Vitals:   07/12/19 0431 07/12/19 0748  BP: 125/87 (!) 138/93  Pulse: 92 (!) 104  Resp: 20 18  Temp: 98 F (36.7 C) 97.6 F (36.4 C)  SpO2: 96% 96%    Skin clean, dry and intact without evidence of skin break down, no evidence of skin tears noted. IV catheter discontinued intact. Site without signs and symptoms of complications. Dressing and pressure applied. Pt denies pain at this time. No complaints noted. Pt requesting a taxi boucher, case mangement notified. An After Visit Summary was printed and given to the patient. Patient escorted via [place], and D/C home  Taxi.  Brett Small

## 2019-07-12 NOTE — TOC Transition Note (Signed)
Transition of Care [facility]) - CM/SW Discharge Note   Patient Details  Name: Brett Small MRN: [id number] Date of Birth: 1976-05-17  Transition of Care [facility]) CM/SW Contact:  Katrina Stack, RN Phone Number: 07/12/2019, 10:30 AM   Clinical Narrative:   Patient for discharge home today.  He does not have transportation home. He claims he does not have financial resources to pay for cab.  Has made effort to contact friends and no one is available.  Provided voucher.  Also provided brochure on Link . [facility] transports to dialysis. Admits that he has not completed the process for medicaid transportation          Patient Goals and CMS Choice        Discharge Placement                       Discharge Plan and Services                                     Social Determinants of Health (SDOH) Interventions     Readmission Risk Interventions Readmission Risk Prevention Plan 07/03/2019 05/21/2019 03/03/2019  Transportation Screening Complete Complete -  Medication Review [employer]) Complete Complete -  PCP or Specialist appointment within 3-5 days of discharge (No Data) - Complete  PCP/Specialist Appt Not Complete comments - - -  [place] or Home Care Consult Complete - -  SW Recovery Care/Counseling Consult - - -  [place] Not Applicable Not Applicable -

## 2019-07-12 NOTE — Discharge Summary (Signed)
[facility] [place] NAME: Brett Small    MR#:  [id number]  DATE OF BIRTH:  06/08/1976  DATE OF ADMISSION:  07/09/2019   ADMITTING PHYSICIAN: Harrie Foreman, MD  DATE OF DISCHARGE: 07/12/2019  PRIMARY CARE PHYSICIAN: Patient, [name]   ADMISSION DIAGNOSIS:  NSTEMI (non-ST elevated myocardial infarction) ([place]) [I21.4] HCAP (healthcare-associated pneumonia) [J18.9] DISCHARGE DIAGNOSIS:  Active Problems: Healthcare associated pneumonia  SECONDARY DIAGNOSIS:   Past Medical History:  Diagnosis Date   ESRD (end stage renal disease) ([place])    Hypertension    Renal disorder    Secondary hyperparathyroidism of renal origin [name])    HOSPITAL COURSE:   Brekyn is a 43 year old male who presented to the ED with shortness of breath.  He had recently been admitted from 07/02/2019-07/06/19 with healthcare associated pneumonia.  He was discharged home with antibiotics to continue as an outpatient, but he states he never picked these up.  In the ED, chest x-ray showed a multi lobar right-sided pneumonia.  He was also noted to have markedly elevated troponins, concerning for NSTEMI. He was started on broad-spectrum antibiotics and a heparin drip.  He was admitted for further management.  HAP- improving. Initially with acute hypoxic respiratory failure, but this resolved.  -Treated with broad-spectrum IV antibiotics and then transitioned to levaquin for a total 7 day course  Elevated troponin- initially felt to be NSTEMI but cardiac cath 9/18 with normal coronaries -Continued home beta blocker   Hypertension- BP well-controlled -Continued home coreg, norvasc, and hydralazine  ESRD on HD TTS -Received dialysis per his usual schedule -Seen by nephrology -Continued home renvela  Macrocyctic anemia- stable. Folate and B12 normal. -Monitor  DISCHARGE CONDITIONS:  Healthcare associated pneumonia Elevated  troponin Hypertension ESRD on HD Macrocytic anemia CONSULTS OBTAINED:  Treatment Team:  Dionisio David, MD DRUG ALLERGIES:   Allergies  Allergen Reactions   Codeine Nausea Only    Patient questioned this (??)   Sulfa Antibiotics Hives and Nausea And Vomiting   DISCHARGE MEDICATIONS:   Allergies as of 07/12/2019      Reactions   Codeine Nausea Only   Patient questioned this (??)   Sulfa Antibiotics Hives, Nausea And Vomiting      Medication List    STOP taking these medications   amoxicillin-clavulanate 875-125 MG tablet Commonly known as: Augmentin   azithromycin 250 MG tablet Commonly known as: Zithromax Z-Pak     TAKE these medications   amLODipine 10 MG tablet Commonly known as: NORVASC Take 1 tablet (10 mg total) by mouth daily at 8 pm.   apixaban 2.5 MG Tabs tablet Commonly known as: ELIQUIS Take 1 tablet (2.5 mg total) by mouth 2 (two) times daily.   aspirin EC 81 MG tablet Take 1 tablet (81 mg total) by mouth daily.   atorvastatin 80 MG tablet Commonly known as: LIPITOR Take 1 tablet (80 mg total) by mouth at bedtime.   carvedilol 25 MG tablet Commonly known as: COREG Take 1 tablet (25 mg total) by mouth 2 (two) times daily with a meal.   feeding supplement (NEPRO CARB STEADY) Liqd Take 237 mLs by mouth 2 (two) times daily between meals.   gabapentin 100 MG capsule Commonly known as: NEURONTIN Take 1 capsule (100 mg total) by mouth 3 (three) times daily.   hydrALAZINE 25 MG tablet Commonly known as: APRESOLINE Take 1 tablet (25 mg total) by mouth every 8 (eight) hours.   hydrOXYzine 25  MG tablet Commonly known as: ATARAX/VISTARIL Take 25 mg by mouth 3 (three) times daily as needed for anxiety.   levofloxacin 500 MG tablet Commonly known as: LEVAQUIN Take 1 tablet (500mg ) by mouth on 9/22 and 9/24 AFTER dialysis   loratadine 10 MG tablet Commonly known as: CLARITIN Take 1 tablet (10 mg total) by mouth daily.   b complex-C-folic acid  1 MG capsule Take 1 capsule by mouth. With supper.   multivitamin Tabs tablet Take 1 tablet by mouth daily.   neomycin-bacitracin-polymyxin Oint Commonly known as: NEOSPORIN Apply 1 application topically 2 (two) times daily.   sevelamer carbonate 800 MG tablet Commonly known as: RENVELA Take 2 tablets (1,600 mg total) by mouth 3 (three) times daily with meals.   traMADol 50 MG tablet Commonly known as: ULTRAM Take 1 tablet (50 mg total) by mouth every 12 (twelve) hours as needed for moderate pain or severe pain.        DISCHARGE INSTRUCTIONS:  1.  Follow-up with PCP in 5 days 2.  Take Levaquin 500 mg on 9/22 and 9/24 to complete a total 7-day course DIET:  Cardiac diet DISCHARGE CONDITION:  Stable ACTIVITY:  Activity as tolerated OXYGEN:  Home Oxygen: No.  Oxygen Delivery: room air DISCHARGE LOCATION:  home   If you experience worsening of your admission symptoms, develop shortness of breath, life threatening emergency, suicidal or homicidal thoughts you must seek medical attention immediately by calling 911 or calling your MD immediately  if symptoms less severe.  You Must read complete instructions/literature along with all the possible adverse reactions/side effects for all the Medicines you take and that have been prescribed to you. Take any new Medicines after you have completely understood and accpet all the possible adverse reactions/side effects.   Please note  You were cared for by a hospitalist during your hospital stay. If you have any questions about your discharge medications or the care you received while you were in the hospital after you are discharged, you can call the unit and asked to speak with the hospitalist on call if the hospitalist that took care of you is not available. Once you are discharged, your primary care physician will handle any further medical issues. Please note that NO REFILLS for any discharge medications will be authorized once you  are discharged, as it is imperative that you return to your primary care physician (or establish a relationship with a primary care physician if you do not have one) for your aftercare needs so that they can reassess your need for medications and monitor your lab values.    On the day of Discharge:  VITAL SIGNS:  Blood pressure (!) 138/93, pulse (!) 104, temperature 97.6 F (36.4 C), temperature source Oral, resp. rate 18, height 5\' 1"  (1.549 m), weight 45.4 kg, SpO2 96 %. PHYSICAL EXAMINATION:  GENERAL:  43 y.o.-year-old patient lying in the bed with no acute distress.  Thin.  Well-appearing. EYES: Pupils equal, round, reactive to light and accommodation. No scleral icterus. Extraocular muscles intact.  HEENT: Head atraumatic, normocephalic. Oropharynx and nasopharynx clear.  NECK:  Supple, no jugular venous distention. No thyroid enlargement, no tenderness.  LUNGS: Normal breath sounds bilaterally, no wheezing, rales,rhonchi or crepitation. No use of accessory muscles of respiration.  Able to speak in full sentences. CARDIOVASCULAR: RRR, S1, S2 normal. No murmurs, rubs, or gallops.  ABDOMEN: Soft, non-tender, non-distended. Bowel sounds present. No organomegaly or mass.  EXTREMITIES: No pedal edema, cyanosis, or clubbing.  NEUROLOGIC: Cranial  nerves II through XII are intact. Muscle strength 5/5 in all extremities. Sensation intact. Gait not checked.  PSYCHIATRIC: The patient is alert and oriented x 3.  SKIN: No obvious rash, lesion, or ulcer.  DATA REVIEW:   CBC Recent Labs  Lab 07/11/19 0453  WBC 9.1  HGB 8.9*  HCT 27.7*  PLT 195    Chemistries  Recent Labs  Lab 07/09/19 0247  07/12/19 0426  NA 138   < > 138  K 3.9   < > 4.1  CL 102   < > 101  CO2 22   < > 27  GLUCOSE 141*   < > 88  BUN 33*   < > 26*  CREATININE 4.44*   < > 3.01*  CALCIUM 9.2   < > 9.0  AST 31  --   --   ALT 12  --   --   ALKPHOS 77  --   --   BILITOT 2.5*  --   --    < > = values in this interval  not displayed.     Microbiology Results  Results for orders placed or performed during the hospital encounter of 07/09/19  SARS Coronavirus 2 [facility] order, Performed in [facility] hospital lab) Nasopharyngeal Nasopharyngeal Swab     Status: None   Collection Time: 07/09/19  2:16 AM   Specimen: Nasopharyngeal Swab  Result Value Ref Range Status   SARS Coronavirus 2 NEGATIVE NEGATIVE Final    Comment: (NOTE) If result is NEGATIVE SARS-CoV-2 target nucleic acids are NOT DETECTED. The SARS-CoV-2 RNA is generally detectable in upper and lower  respiratory specimens during the acute phase of infection. The lowest  concentration of SARS-CoV-2 viral copies this assay can detect is 250  copies / mL. A negative result does not preclude SARS-CoV-2 infection  and should not be used as the sole basis for treatment or other  patient management decisions.  A negative result may occur with  improper specimen collection / handling, submission of specimen other  than nasopharyngeal swab, presence of viral mutation(s) within the  areas targeted by this assay, and inadequate number of viral copies  (<250 copies / mL). A negative result must be combined with clinical  observations, patient history, and epidemiological information. If result is POSITIVE SARS-CoV-2 target nucleic acids are DETECTED. The SARS-CoV-2 RNA is generally detectable in upper and lower  respiratory specimens dur ing the acute phase of infection.  Positive  results are indicative of active infection with SARS-CoV-2.  Clinical  correlation with patient history and other diagnostic information is  necessary to determine patient infection status.  Positive results do  not rule out bacterial infection or co-infection with other viruses. If result is PRESUMPTIVE POSTIVE SARS-CoV-2 nucleic acids MAY BE PRESENT.   A presumptive positive result was obtained on the submitted specimen  and confirmed on repeat testing.  While 2019  novel coronavirus  (SARS-CoV-2) nucleic acids may be present in the submitted sample  additional confirmatory testing may be necessary for epidemiological  and / or clinical management purposes  to differentiate between  SARS-CoV-2 and other Sarbecovirus currently known to infect humans.  If clinically indicated additional testing with an alternate test  methodology [phone]) is advised. The SARS-CoV-2 RNA is generally  detectable in upper and lower respiratory sp ecimens during the acute  phase of infection. The expected result is Negative. Fact Sheet for Patients:  StrictlyIdeas.no Fact Sheet for Healthcare Providers: BankingDealers.co.za This test is not yet approved  or cleared by the [place] and has been authorized for detection and/or diagnosis of SARS-CoV-2 by FDA under an Emergency Use Authorization (EUA).  This EUA will remain in effect (meaning this test can be used) for the duration of the COVID-19 declaration under Section 564(b)(1) of the Act, 21 U.S.C. section 360bbb-3(b)(1), unless the authorization is terminated or revoked sooner. Performed at [facility], [address] [place], [place] [address]   Blood culture (routine x 2)     Status: None (Preliminary result)   Collection Time: 07/09/19  2:47 AM   Specimen: BLOOD  Result Value Ref Range Status   Specimen Description BLOOD RIGHT ASSIST CONTROL  Final   Special Requests   Final    BOTTLES DRAWN AEROBIC AND ANAEROBIC Blood Culture adequate volume   Culture   Final    NO GROWTH 3 DAYS Performed at [facility], [address], [place], [place] [address]    Report Status PENDING  Incomplete  Blood culture (routine x 2)     Status: None (Preliminary result)   Collection Time: 07/09/19  2:47 AM   Specimen: BLOOD  Result Value Ref Range Status   Specimen Description BLOOD RIGHT HAND  Final   Special Requests   Final    BOTTLES DRAWN  AEROBIC AND ANAEROBIC Blood Culture adequate volume   Culture   Final    NO GROWTH 3 DAYS Performed at [facility], [address], [place], [place] [address]    Report Status PENDING  Incomplete  MRSA PCR Screening     Status: None   Collection Time: 07/09/19 11:26 AM   Specimen: Nasal Mucosa; Nasopharyngeal  Result Value Ref Range Status   MRSA by PCR NEGATIVE NEGATIVE Final    Comment:        The GeneXpert MRSA Assay (FDA approved for NASAL specimens only), is one component of a comprehensive MRSA colonization surveillance program. It is not intended to diagnose MRSA infection nor to guide or monitor treatment for MRSA infections. Performed at [facility], [address], [place], [place] [address]     RADIOLOGY:  No results found.   Management plans discussed with the patient, family and they are in agreement.  CODE STATUS: Full Code   TOTAL TIME TAKING CARE OF THIS PATIENT: 40 minutes.    Berna Spare Vardaan Depascale M.D on 07/12/2019 at 10:12 AM  Between 7am to 6pm - Pager - [phone]  After 6pm go to [email or web address] - Proofreader  [facility]  Office  [phone]  CC: Primary care physician; Patient, [name]   Note: This dictation was prepared with Dragon dictation along with smaller phrase technology. Any transcriptional errors that result from this process are unintentional.

## 2019-07-12 NOTE — Discharge Instructions (Signed)
It was so nice to meet you during this hospitalization!  You came into the hospital with chest pain and shortness of breath. We did a cardiac catheterization to make sure you weren't having a heart attack. Your cath did not show any blockages, so you were not having a heart attack. We also found that you had a pneumonia.   For the pneumonia, please take 1 tablet of Levaquin after dialysis on Tuesday 9/22 and 1 tablet after dialysis on 9/24.  Take care, Dr. Brett Albino

## 2019-07-14 LAB — CULTURE, BLOOD (ROUTINE X 2)
Culture: NO GROWTH
Culture: NO GROWTH
Special Requests: ADEQUATE
Special Requests: ADEQUATE

## 2019-07-20 ENCOUNTER — Emergency Department: Payer: Medicare Other

## 2019-07-20 ENCOUNTER — Encounter: Payer: Self-pay | Admitting: Emergency Medicine

## 2019-07-20 ENCOUNTER — Inpatient Hospital Stay
Admission: EM | Admit: 2019-07-20 | Discharge: 2019-07-23 | DRG: 186 | Disposition: A | Discharge status: Home / Self Care | Payer: Medicare Other | Attending: Internal Medicine | Admitting: Internal Medicine

## 2019-07-20 DIAGNOSIS — Z9889 Other specified postprocedural states: Secondary | ICD-10-CM

## 2019-07-20 DIAGNOSIS — Z885 Allergy status to narcotic agent status: Secondary | ICD-10-CM | POA: Diagnosis not present

## 2019-07-20 DIAGNOSIS — I509 Heart failure, unspecified: Secondary | ICD-10-CM | POA: Diagnosis present

## 2019-07-20 DIAGNOSIS — I998 Other disorder of circulatory system: Secondary | ICD-10-CM | POA: Diagnosis present

## 2019-07-20 DIAGNOSIS — Z7901 Long term (current) use of anticoagulants: Secondary | ICD-10-CM

## 2019-07-20 DIAGNOSIS — N2581 Secondary hyperparathyroidism of renal origin: Secondary | ICD-10-CM | POA: Diagnosis present

## 2019-07-20 DIAGNOSIS — I132 Hypertensive heart and chronic kidney disease with heart failure and with stage 5 chronic kidney disease, or end stage renal disease: Secondary | ICD-10-CM | POA: Diagnosis present

## 2019-07-20 DIAGNOSIS — D631 Anemia in chronic kidney disease: Secondary | ICD-10-CM | POA: Diagnosis present

## 2019-07-20 DIAGNOSIS — Z7982 Long term (current) use of aspirin: Secondary | ICD-10-CM

## 2019-07-20 DIAGNOSIS — F329 Major depressive disorder, single episode, unspecified: Secondary | ICD-10-CM | POA: Diagnosis present

## 2019-07-20 DIAGNOSIS — Z66 Do not resuscitate: Secondary | ICD-10-CM | POA: Diagnosis present

## 2019-07-20 DIAGNOSIS — Z79899 Other long term (current) drug therapy: Secondary | ICD-10-CM | POA: Diagnosis not present

## 2019-07-20 DIAGNOSIS — J90 Pleural effusion, not elsewhere classified: Secondary | ICD-10-CM | POA: Diagnosis present

## 2019-07-20 DIAGNOSIS — Z955 Presence of coronary angioplasty implant and graft: Secondary | ICD-10-CM

## 2019-07-20 DIAGNOSIS — Z882 Allergy status to sulfonamides status: Secondary | ICD-10-CM

## 2019-07-20 DIAGNOSIS — N2889 Other specified disorders of kidney and ureter: Secondary | ICD-10-CM | POA: Diagnosis present

## 2019-07-20 DIAGNOSIS — Z20828 Contact with and (suspected) exposure to other viral communicable diseases: Secondary | ICD-10-CM | POA: Diagnosis present

## 2019-07-20 DIAGNOSIS — F17210 Nicotine dependence, cigarettes, uncomplicated: Secondary | ICD-10-CM | POA: Diagnosis present

## 2019-07-20 DIAGNOSIS — Z992 Dependence on renal dialysis: Secondary | ICD-10-CM

## 2019-07-20 DIAGNOSIS — J9601 Acute respiratory failure with hypoxia: Secondary | ICD-10-CM

## 2019-07-20 DIAGNOSIS — J189 Pneumonia, unspecified organism: Secondary | ICD-10-CM | POA: Diagnosis present

## 2019-07-20 DIAGNOSIS — I739 Peripheral vascular disease, unspecified: Secondary | ICD-10-CM | POA: Diagnosis present

## 2019-07-20 DIAGNOSIS — Z8249 Family history of ischemic heart disease and other diseases of the circulatory system: Secondary | ICD-10-CM | POA: Diagnosis not present

## 2019-07-20 DIAGNOSIS — I719 Aortic aneurysm of unspecified site, without rupture: Secondary | ICD-10-CM | POA: Diagnosis present

## 2019-07-20 DIAGNOSIS — N186 End stage renal disease: Secondary | ICD-10-CM | POA: Diagnosis present

## 2019-07-20 HISTORY — DX: Peripheral vascular disease, unspecified: I73.9

## 2019-07-20 LAB — APTT: aPTT: 27 seconds (ref 24–36)

## 2019-07-20 LAB — PROCALCITONIN: Procalcitonin: 0.3 ng/mL

## 2019-07-20 LAB — LIPASE, BLOOD: Lipase: 72 U/L — ABNORMAL HIGH (ref 11–51)

## 2019-07-20 LAB — CBC WITH DIFFERENTIAL/PLATELET
Abs Immature Granulocytes: 0.05 10*3/uL (ref 0.00–0.07)
Basophils Absolute: 0.1 10*3/uL (ref 0.0–0.1)
Basophils Relative: 1 %
Eosinophils Absolute: 0.1 10*3/uL (ref 0.0–0.5)
Eosinophils Relative: 1 %
HCT: 31.2 % — ABNORMAL LOW (ref 39.0–52.0)
Hemoglobin: 9.8 g/dL — ABNORMAL LOW (ref 13.0–17.0)
Immature Granulocytes: 1 %
Lymphocytes Relative: 10 %
Lymphs Abs: 1.1 10*3/uL (ref 0.7–4.0)
MCH: 32.2 pg (ref 26.0–34.0)
MCHC: 31.4 g/dL (ref 30.0–36.0)
MCV: 102.6 fL — ABNORMAL HIGH (ref 80.0–100.0)
Monocytes Absolute: 0.4 10*3/uL (ref 0.1–1.0)
Monocytes Relative: 4 %
Neutro Abs: 9.3 10*3/uL — ABNORMAL HIGH (ref 1.7–7.7)
Neutrophils Relative %: 83 %
Platelets: 234 10*3/uL (ref 150–400)
RBC: 3.04 MIL/uL — ABNORMAL LOW (ref 4.22–5.81)
RDW: 17.3 % — ABNORMAL HIGH (ref 11.5–15.5)
WBC: 11 10*3/uL — ABNORMAL HIGH (ref 4.0–10.5)
nRBC: 0 % (ref 0.0–0.2)

## 2019-07-20 LAB — HEPARIN LEVEL (UNFRACTIONATED): Heparin Unfractionated: <0.1 IU/mL — ABNORMAL LOW (ref 0.30–0.70)

## 2019-07-20 LAB — PROTIME-INR
INR: 1.2 (ref 0.8–1.2)
Prothrombin Time: 15.4 seconds — ABNORMAL HIGH (ref 11.4–15.2)

## 2019-07-20 LAB — COMPREHENSIVE METABOLIC PANEL
ALT: 18 U/L (ref 0–44)
AST: 26 U/L (ref 15–41)
Albumin: 3.1 g/dL — ABNORMAL LOW (ref 3.5–5.0)
Alkaline Phosphatase: 81 U/L (ref 38–126)
Anion gap: 12 (ref 5–15)
BUN: 30 mg/dL — ABNORMAL HIGH (ref 6–20)
CO2: 24 mmol/L (ref 22–32)
Calcium: 9.4 mg/dL (ref 8.9–10.3)
Chloride: 102 mmol/L (ref 98–111)
Creatinine, Ser: 4.39 mg/dL — ABNORMAL HIGH (ref 0.61–1.24)
GFR calc Af Amer: 18 mL/min — ABNORMAL LOW (ref >60)
GFR calc non Af Amer: 15 mL/min — ABNORMAL LOW (ref >60)
Glucose, Bld: 111 mg/dL — ABNORMAL HIGH (ref 70–99)
Potassium: 3.6 mmol/L (ref 3.5–5.1)
Sodium: 138 mmol/L (ref 135–145)
Total Bilirubin: 1.8 mg/dL — ABNORMAL HIGH (ref 0.3–1.2)
Total Protein: 7.2 g/dL (ref 6.5–8.1)

## 2019-07-20 LAB — TROPONIN I (HIGH SENSITIVITY)
Troponin I (High Sensitivity): 67 ng/L — ABNORMAL HIGH (ref <18)
Troponin I (High Sensitivity): 59 ng/L — ABNORMAL HIGH

## 2019-07-20 LAB — TYPE AND SCREEN
ABO/RH(D): AB POS
Antibody Screen: NEGATIVE

## 2019-07-20 LAB — SARS CORONAVIRUS 2 BY RT PCR (HOSPITAL ORDER, PERFORMED IN ~~LOC~~ HOSPITAL LAB): SARS Coronavirus 2: NEGATIVE

## 2019-07-20 LAB — BRAIN NATRIURETIC PEPTIDE: B Natriuretic Peptide: >4500 pg/mL — ABNORMAL HIGH (ref 0.0–100.0)

## 2019-07-20 MED ORDER — CHLORHEXIDINE GLUCONATE CLOTH 2 % EX PADS
6.0000 | MEDICATED_PAD | Freq: Every day | CUTANEOUS | Status: DC
  Administered 2019-07-21 – 2019-07-22 (×2): 6 via TOPICAL

## 2019-07-20 MED ORDER — HEPARIN BOLUS VIA INFUSION
1500.0000 [IU] | Freq: Once | INTRAVENOUS | Status: DC
  Filled 2019-07-20: qty 1500

## 2019-07-20 MED ORDER — SEVELAMER CARBONATE 800 MG PO TABS
1600.0000 mg | ORAL_TABLET | Freq: Three times a day (TID) | ORAL | Status: DC
  Administered 2019-07-21 – 2019-07-22 (×3): 1600 mg via ORAL
  Filled 2019-07-20 (×4): qty 2

## 2019-07-20 MED ORDER — HYDROXYZINE HCL 25 MG PO TABS: 25.0000 mg | ORAL_TABLET | Freq: Three times a day (TID) | ORAL | Status: DC | PRN

## 2019-07-20 MED ORDER — GABAPENTIN 100 MG PO CAPS
100.0000 mg | ORAL_CAPSULE | Freq: Three times a day (TID) | ORAL | Status: DC
  Administered 2019-07-20 – 2019-07-23 (×8): 100 mg via ORAL
  Filled 2019-07-20 (×8): qty 1

## 2019-07-20 MED ORDER — LORATADINE 10 MG PO TABS
10.0000 mg | ORAL_TABLET | Freq: Every day | ORAL | Status: DC
  Administered 2019-07-21 – 2019-07-23 (×3): 10 mg via ORAL
  Filled 2019-07-20 (×3): qty 1

## 2019-07-20 MED ORDER — HEPARIN (PORCINE) 25000 UT/250ML-% IV SOLN
950.0000 [IU]/h | INTRAVENOUS | Status: DC
  Administered 2019-07-20: 600 [IU]/h via INTRAVENOUS
  Administered 2019-07-21: 800 [IU]/h via INTRAVENOUS
  Filled 2019-07-20 (×2): qty 250

## 2019-07-20 MED ORDER — ATORVASTATIN CALCIUM 20 MG PO TABS
80.0000 mg | ORAL_TABLET | Freq: Every day | ORAL | Status: DC
  Administered 2019-07-20 – 2019-07-22 (×3): 80 mg via ORAL
  Filled 2019-07-20 (×3): qty 4

## 2019-07-20 MED ORDER — ACETAMINOPHEN 325 MG PO TABS: 650.0000 mg | ORAL_TABLET | Freq: Four times a day (QID) | ORAL | Status: DC | PRN

## 2019-07-20 MED ORDER — NEPRO/CARBSTEADY PO LIQD
237.0000 mL | Freq: Two times a day (BID) | ORAL | Status: DC
  Administered 2019-07-22: 10:00:00 237 mL via ORAL

## 2019-07-20 MED ORDER — SODIUM CHLORIDE 0.9 % IV SOLN
3.0000 g | INTRAVENOUS | Status: DC
  Administered 2019-07-20: 3 g via INTRAVENOUS
  Filled 2019-07-20 (×2): qty 8

## 2019-07-20 MED ORDER — HEPARIN (PORCINE) 25000 UT/250ML-% IV SOLN: 600.0000 [IU]/h | INTRAVENOUS | Status: DC

## 2019-07-20 MED ORDER — CARVEDILOL 25 MG PO TABS
25.0000 mg | ORAL_TABLET | Freq: Two times a day (BID) | ORAL | Status: DC
  Administered 2019-07-20 – 2019-07-22 (×4): 25 mg via ORAL
  Filled 2019-07-20 (×4): qty 1

## 2019-07-20 MED ORDER — FENTANYL CITRATE (PF) 100 MCG/2ML IJ SOLN
50.0000 ug | Freq: Once | INTRAMUSCULAR | Status: AC
  Administered 2019-07-20: 11:00:00 50 ug via INTRAVENOUS
  Filled 2019-07-20: qty 2

## 2019-07-20 MED ORDER — HYDRALAZINE HCL 25 MG PO TABS
25.0000 mg | ORAL_TABLET | Freq: Three times a day (TID) | ORAL | Status: DC
  Administered 2019-07-20 – 2019-07-23 (×6): 25 mg via ORAL
  Filled 2019-07-20 (×6): qty 1

## 2019-07-20 MED ORDER — CARVEDILOL 25 MG PO TABS: 25.0000 mg | ORAL_TABLET | Freq: Two times a day (BID) | ORAL | Status: DC

## 2019-07-20 MED ORDER — ONDANSETRON HCL 4 MG/2ML IJ SOLN
4.0000 mg | Freq: Once | INTRAMUSCULAR | Status: AC
  Administered 2019-07-20: 4 mg via INTRAVENOUS
  Filled 2019-07-20: qty 2

## 2019-07-20 MED ORDER — ASPIRIN EC 81 MG PO TBEC
81.0000 mg | DELAYED_RELEASE_TABLET | Freq: Every day | ORAL | Status: DC
  Administered 2019-07-21 – 2019-07-23 (×3): 81 mg via ORAL
  Filled 2019-07-20 (×3): qty 1

## 2019-07-20 MED ORDER — AMLODIPINE BESYLATE 10 MG PO TABS
10.0000 mg | ORAL_TABLET | Freq: Every day | ORAL | Status: DC
  Administered 2019-07-20 – 2019-07-22 (×3): 10 mg via ORAL
  Filled 2019-07-20 (×3): qty 1

## 2019-07-20 MED ORDER — OXYCODONE-ACETAMINOPHEN 5-325 MG PO TABS
1.0000 | ORAL_TABLET | Freq: Four times a day (QID) | ORAL | Status: DC | PRN
  Administered 2019-07-21 – 2019-07-23 (×6): 1 via ORAL
  Filled 2019-07-20 (×6): qty 1

## 2019-07-20 MED ORDER — RENA-VITE PO TABS
1.0000 | ORAL_TABLET | Freq: Every day | ORAL | Status: DC
  Administered 2019-07-20 – 2019-07-22 (×3): 1 via ORAL
  Filled 2019-07-20 (×3): qty 1

## 2019-07-20 MED ORDER — IOHEXOL 350 MG/ML SOLN
75.0000 mL | Freq: Once | INTRAVENOUS | Status: AC | PRN
  Administered 2019-07-20: 75 mL via INTRAVENOUS

## 2019-07-20 MED ORDER — HEPARIN BOLUS VIA INFUSION
1500.0000 [IU] | Freq: Once | INTRAVENOUS | Status: AC
  Administered 2019-07-20: 1500 [IU] via INTRAVENOUS
  Filled 2019-07-20: qty 1500

## 2019-07-20 MED ORDER — DOCUSATE SODIUM 100 MG PO CAPS
100.0000 mg | ORAL_CAPSULE | Freq: Two times a day (BID) | ORAL | Status: DC | PRN
  Administered 2019-07-23: 100 mg via ORAL
  Filled 2019-07-20: qty 1

## 2019-07-20 MED ORDER — RENA-VITE PO TABS: 1.0000 | ORAL_TABLET | Freq: Every day | ORAL | Status: DC

## 2019-07-20 NOTE — Consult Note (Signed)
ANTICOAGULATION CONSULT NOTE - Initial Consult  Pharmacy Consult for Heparin Indication: History of aortic aneurysm surgery and thrombus, holding Eliquis for procedure  Allergies  Allergen Reactions  . Codeine Nausea Only    Patient questioned this (??)  . Sulfa Antibiotics Hives and Nausea And Vomiting    Patient Measurements:   Heparin Dosing Weight: 45.4kg  Vital Signs: Temp: 98.4 F (36.9 C) (09/28 1001) Temp Source: Oral (09/28 1001) BP: 169/113 (09/28 1830) Pulse Rate: 109 (09/28 1830)  Labs: Recent Labs    07/20/19 1029 07/20/19 1156  HGB 9.8*  --   HCT 31.2*  --   PLT 234  --   APTT 27  --   LABPROT 15.4*  --   INR 1.2  --   CREATININE 4.39*  --   TROPONINIHS 67* 59*    Estimated Creatinine Clearance: 13.6 mL/min (A) (by C-G formula based on SCr of 4.39 mg/dL (H)).   Medical History: Past Medical History:  Diagnosis Date  . ESRD (end stage renal disease) ([place])   . Hypertension   . Renal disorder   . Secondary hyperparathyroidism of renal origin ([place])     Medications:  Pt on Eliquis 2.5mg  bid prior to admission -  Last dose 9/27 @ 2000  Assessment: Pt with possible expected procedure (possible thoracentesis) - will transition to heparin drip from Eliquis per pharmacy protocol.  It has been > 12 hours since last Eliquis dose, so safe to start therapy.   Goal of Therapy:  Heparin level 0.3-0.7 units/ml Monitor platelets by anticoagulation protocol: Yes   Plan:  Will start with a smaller bolus of 1500 units given relatively recent DOAC usage and renal function, followed by 600 units/hr  Will check APTT in 6 hours and f/u with heparin level and APTT with am labs.  Lu Duffel, PharmD, BCPS Clinical Pharmacist 07/20/2019 7:51 PM

## 2019-07-20 NOTE — ED Notes (Signed)
ED TO INPATIENT HANDOFF REPORT  ED Nurse Name and Phone #: [place] [phone]  S Name/Age/Gender Brett Small 43 y.o. male Room/Bed: ED02A/ED02A  Code Status   Code Status: Prior  Home/SNF/Other Home Patient oriented to: self, place, time and situation Is this baseline? Yes   Triage Complete: Triage complete  Chief Complaint CP  Triage Note Pt c/o CP that started this am. PT is dialysis on t/th/sat. PT had 324 Asprin and 1 nitro in route. MD at bedside    Allergies Allergies  Allergen Reactions  . Codeine Nausea Only    Patient questioned this (??)  . Sulfa Antibiotics Hives and Nausea And Vomiting    Level of Care/Admitting Diagnosis ED Disposition    ED Disposition Condition Comment   Admit  The patient appears reasonably stabilized for admission considering the current resources, flow, and capabilities available in the ED at this time, and I doubt any other [facility] requiring further screening and/or treatment in the ED prior to admission is  present.       B Medical/Surgery History Past Medical History:  Diagnosis Date  . ESRD (end stage renal disease) ([place])   . Hypertension   . Renal disorder   . Secondary hyperparathyroidism of renal origin [facility])    Past Surgical History:  Procedure Laterality Date  . A/V SHUNT INTERVENTION Left 01/19/2019   Procedure: LEFT UPPER EXTREMITY A/V SHUNTOGRAM / UPPER EXTREMITY ANGIOGRAM;  Surgeon: Algernon Huxley, MD;  Location: [place] [facility];  Service: Cardiovascular;  Laterality: Left;  . A/V SHUNT INTERVENTION Left 03/02/2019   Procedure: A/V SHUNT INTERVENTION;  Surgeon: Algernon Huxley, MD;  Location: [place] [facility];  Service: Cardiovascular;  Laterality: Left;  . AORTA - FEMORAL ARTERY BYPASS GRAFT    . AV FISTULA PLACEMENT Left 12/26/2018   Procedure: INSERTION OF GORE STRETCH VASCULAR 4-7MM X  45CM IN LEFT UPPER ARM;  Surgeon: Marty Heck, MD;  Location: [place];  Service: Vascular;  Laterality: Left;  .  DIALYSIS/PERMA CATHETER REMOVAL N/A 02/06/2019   Procedure: DIALYSIS/PERMA CATHETER REMOVAL;  Surgeon: Algernon Huxley, MD;  Location: [place] [facility];  Service: Cardiovascular;  Laterality: N/A;  . LEFT HEART CATH AND CORONARY ANGIOGRAPHY Right 07/10/2019   Procedure: LEFT HEART CATH AND CORONARY ANGIOGRAPHY;  Surgeon: Dionisio David, MD;  Location: [place] [facility];  Service: Cardiovascular;  Laterality: Right;     A IV Location/Drains/Wounds Patient Lines/Drains/Airways Status   Active Line/Drains/Airways    Name:   Placement date:   Placement time:   Site:   Days:   Peripheral IV 07/20/19 Right Antecubital   07/20/19    1034    Antecubital   less than 1   Fistula / Graft Left Upper arm Arteriovenous vein graft   12/26/18    1024    Upper arm   206   Hemodialysis Catheter Right Internal jugular Double lumen Temporary (Non-Tunneled)   -    -    Internal jugular      Wound / Incision (Open or Dehisced) 05/19/19 Ankle Right dry, scabbed   05/19/19    0620    Ankle   62          Intake/Output Last 24 hours No intake or output data in the 24 hours ending 07/20/19 1806  Labs/Imaging Results for orders placed or performed during the hospital encounter of 07/20/19 (from the past 48 hour(s))  CBC with Differential     Status: Abnormal  Collection Time: 07/20/19 10:29 AM  Result Value Ref Range   WBC 11.0 (H) 4.0 - 10.5 K/uL   RBC 3.04 (L) 4.22 - 5.81 MIL/uL   Hemoglobin 9.8 (L) 13.0 - 17.0 g/dL   HCT 31.2 (L) 39.0 - 52.0 %   MCV 102.6 (H) 80.0 - 100.0 fL   MCH 32.2 26.0 - 34.0 pg   MCHC 31.4 30.0 - 36.0 g/dL   RDW 17.3 (H) 11.5 - 15.5 %   Platelets 234 150 - 400 K/uL   nRBC 0.0 0.0 - 0.2 %   Neutrophils Relative % 83 %   Neutro Abs 9.3 (H) 1.7 - 7.7 K/uL   Lymphocytes Relative 10 %   Lymphs Abs 1.1 0.7 - 4.0 K/uL   Monocytes Relative 4 %   Monocytes Absolute 0.4 0.1 - 1.0 K/uL   Eosinophils Relative 1 %   Eosinophils Absolute 0.1 0.0 - 0.5 K/uL   Basophils Relative 1 %    Basophils Absolute 0.1 0.0 - 0.1 K/uL   Immature Granulocytes 1 %   Abs Immature Granulocytes 0.05 0.00 - 0.07 K/uL    Comment: Performed at [facility], [address] [place], [place] [address]  Comprehensive metabolic panel     Status: Abnormal   Collection Time: 07/20/19 10:29 AM  Result Value Ref Range   Sodium 138 135 - 145 mmol/L   Potassium 3.6 3.5 - 5.1 mmol/L   Chloride 102 98 - 111 mmol/L   CO2 24 22 - 32 mmol/L   Glucose, Bld 111 (H) 70 - 99 mg/dL   BUN 30 (H) 6 - 20 mg/dL   Creatinine, Ser 4.39 (H) 0.61 - 1.24 mg/dL   Calcium 9.4 8.9 - 10.3 mg/dL   Total Protein 7.2 6.5 - 8.1 g/dL   Albumin 3.1 (L) 3.5 - 5.0 g/dL   AST 26 15 - 41 U/L   ALT 18 0 - 44 U/L   Alkaline Phosphatase 81 38 - 126 U/L   Total Bilirubin 1.8 (H) 0.3 - 1.2 mg/dL   GFR calc non Af Amer 15 (L) >60 mL/min   GFR calc Af Amer 18 (L) >60 mL/min   Anion gap 12 5 - 15    Comment: Performed at [facility], [address] [place], [place] [address]  Lipase, blood     Status: Abnormal   Collection Time: 07/20/19 10:29 AM  Result Value Ref Range   Lipase 72 (H) 11 - 51 U/L    Comment: Performed at [facility], [address] [place], [place] [address]  Type and screen [place]     Status: None   Collection Time: 07/20/19 10:29 AM  Result Value Ref Range   ABO/RH(D) AB POS    Antibody Screen NEG    Sample Expiration      07/23/2019,2359 Performed at [facility], [address] [place], [place] [address]   Protime-INR     Status: Abnormal   Collection Time: 07/20/19 10:29 AM  Result Value Ref Range   Prothrombin Time 15.4 (H) 11.4 - 15.2 seconds   INR 1.2 0.8 - 1.2    Comment: (NOTE) INR goal varies based on device and disease states. Performed at [facility], [address] [place], [place] [address]   APTT     Status: None   Collection Time: 07/20/19 10:29 AM  Result Value Ref Range   aPTT 27 24 - 36 seconds     Comment: Performed  at [facility], [place], [place] [address]  Troponin I (High Sensitivity)     Status: Abnormal   Collection Time: 07/20/19 10:29 AM  Result Value Ref Range   Troponin I (High Sensitivity) 67 (H) <18 ng/L    Comment: (NOTE) Elevated high sensitivity troponin I (hsTnI) values and significant  changes across serial measurements may suggest ACS but many other  chronic and acute conditions are known to elevate hsTnI results.  Refer to the "Links" section for chest pain algorithms and additional  guidance. Performed at [facility], [address] [place], [place] [address]   Brain natriuretic peptide     Status: Abnormal   Collection Time: 07/20/19 10:29 AM  Result Value Ref Range   B Natriuretic Peptide >4,500.0 (H) 0.0 - 100.0 pg/mL    Comment: Performed at [facility], [address] [place], [place] [address]  SARS Coronavirus 2 [facility] order, Performed in [facility] hospital lab) Nasopharyngeal Nasopharyngeal Swab     Status: None   Collection Time: 07/20/19 10:29 AM   Specimen: Nasopharyngeal Swab  Result Value Ref Range   SARS Coronavirus 2 NEGATIVE NEGATIVE    Comment: (NOTE) If result is NEGATIVE SARS-CoV-2 target nucleic acids are NOT DETECTED. The SARS-CoV-2 RNA is generally detectable in upper and lower  respiratory specimens during the acute phase of infection. The lowest  concentration of SARS-CoV-2 viral copies this assay can detect is 250  copies / mL. A negative result does not preclude SARS-CoV-2 infection  and should not be used as the sole basis for treatment or other  patient management decisions.  A negative result may occur with  improper specimen collection / handling, submission of specimen other  than nasopharyngeal swab, presence of viral mutation(s) within the  areas targeted by this assay, and inadequate number of viral copies  (<250 copies / mL). A negative result must be combined  with clinical  observations, patient history, and epidemiological information. If result is POSITIVE SARS-CoV-2 target nucleic acids are DETECTED. The SARS-CoV-2 RNA is generally detectable in upper and lower  respiratory specimens dur ing the acute phase of infection.  Positive  results are indicative of active infection with SARS-CoV-2.  Clinical  correlation with patient history and other diagnostic information is  necessary to determine patient infection status.  Positive results do  not rule out bacterial infection or co-infection with other viruses. If result is PRESUMPTIVE POSTIVE SARS-CoV-2 nucleic acids MAY BE PRESENT.   A presumptive positive result was obtained on the submitted specimen  and confirmed on repeat testing.  While 2019 novel coronavirus  (SARS-CoV-2) nucleic acids may be present in the submitted sample  additional confirmatory testing may be necessary for epidemiological  and / or clinical management purposes  to differentiate between  SARS-CoV-2 and other Sarbecovirus currently known to infect humans.  If clinically indicated additional testing with an alternate test  methodology [phone]) is advised. The SARS-CoV-2 RNA is generally  detectable in upper and lower respiratory sp ecimens during the acute  phase of infection. The expected result is Negative. Fact Sheet for Patients:  StrictlyIdeas.no Fact Sheet for Healthcare Providers: BankingDealers.co.za This test is not yet approved or cleared by the [place] FDA and has been authorized for detection and/or diagnosis of SARS-CoV-2 by FDA under an Emergency Use Authorization (EUA).  This EUA will remain in effect (meaning this test can be used) for the duration of the COVID-19 declaration under Section 564(b)(1) of the Act,  21 U.S.C. section 360bbb-3(b)(1), unless the authorization is terminated or revoked sooner. Performed at [facility], [place], [place] [address]   Troponin I (High Sensitivity)     Status: Abnormal   Collection Time: 07/20/19 11:56 AM  Result Value Ref Range   Troponin I (High Sensitivity) 59 (H) <18 ng/L    Comment: (NOTE) Elevated high sensitivity troponin I (hsTnI) values and significant  changes across serial measurements may suggest ACS but many other  chronic and acute conditions are known to elevate hsTnI results.  Refer to the "Links" section for chest pain algorithms and additional  guidance. Performed at [facility], [address] [place], [place] [address]   Procalcitonin - Baseline     Status: None   Collection Time: 07/20/19 11:56 AM  Result Value Ref Range   Procalcitonin 0.30 ng/mL    Comment:        Interpretation: PCT (Procalcitonin) <= 0.5 ng/mL: Systemic infection (sepsis) is not likely. Local bacterial infection is possible. (NOTE)       Sepsis PCT Algorithm           Lower Respiratory Tract                                      Infection PCT Algorithm    ----------------------------     ----------------------------         PCT < 0.25 ng/mL                PCT < 0.10 ng/mL         Strongly encourage             Strongly discourage   discontinuation of antibiotics    initiation of antibiotics    ----------------------------     -----------------------------       PCT 0.25 - 0.50 ng/mL            PCT 0.10 - 0.25 ng/mL               OR       >80% decrease in PCT            Discourage initiation of                                            antibiotics      Encourage discontinuation           of antibiotics    ----------------------------     -----------------------------         PCT >= 0.50 ng/mL              PCT 0.26 - 0.50 ng/mL               AND        <80% decrease in PCT             Encourage initiation of                                             antibiotics       Encourage continuation           of antibiotics     ----------------------------     -----------------------------  PCT >= 0.50 ng/mL                  PCT > 0.50 ng/mL               AND         increase in PCT                  Strongly encourage                                      initiation of antibiotics    Strongly encourage escalation           of antibiotics                                     -----------------------------                                           PCT <= 0.25 ng/mL                                                 OR                                        > 80% decrease in PCT                                     Discontinue / Do not initiate                                             antibiotics Performed at [facility], [address] [place],  [address]    Dg Chest Portable 1 View  Result Date: 07/20/2019 CLINICAL DATA:  Chest pain. EXAM: PORTABLE CHEST 1 VIEW COMPARISON:  07/09/2019 FINDINGS: There is a moderate to large left-sided pleural effusion with adjacent compressive atelectasis. There is persistent cardiomegaly. There are scattered airspace opacities throughout the right lung zone which have improved from prior study. There is no pneumothorax. There is a trace right-sided pleural effusion. There is a well-positioned tunneled right-sided dialysis catheter. A vascular stent projects over the patient's axilla. IMPRESSION: 1. Moderate to large left-sided pleural effusion, increased in size from prior study. 2. Improving airspace opacity throughout the right lung field. 3. Well-positioned tunneled dialysis catheter. Electronically Signed   By: Constance Holster M.D.   On: 07/20/2019 10:34   Ct Angio Chest/abd/pel For Dissection W And/or Wo Contrast  Result Date: 07/20/2019 CLINICAL DATA:  Chest pain. Prior triple area EXAM: CT ANGIOGRAPHY CHEST, ABDOMEN AND PELVIS TECHNIQUE: Multidetector CT imaging through the chest, abdomen and pelvis was performed using the standard protocol during bolus  administration of intravenous contrast. Multiplanar reconstructed images and MIPs were obtained and reviewed to evaluate the vascular anatomy. CONTRAST:  29mL  OMNIPAQUE IOHEXOL 350 MG/ML SOLN COMPARISON:  May 19, 2019 FINDINGS: CTA CHEST FINDINGS Cardiovascular: There is no large centrally located pulmonary embolism. The heart size is mildly enlarged. There is no aortic dissection. There is a tunneled dialysis catheter with appropriate positioning. The intracardiac blood pool is hypodense relative to the adjacent myocardium consistent with anemia. There is a small pericardial effusion. Mediastinum/Nodes: --there are enlarged mediastinal and hilar lymph nodes. --there are no pathologically enlarged axillary lymph nodes. --No supraclavicular lymphadenopathy. --Normal thyroid gland. --The esophagus is unremarkable Lungs/Pleura: There is a moderate right and large left pleural effusion. There is partial collapse of the left lower lobe. There are scattered airspace opacities in the right lower lobe. There is interlobular septal thickening bilaterally. There is no pneumothorax. There is debris within the upper trachea. Musculoskeletal: No chest wall abnormality. No acute or significant osseous findings. Review of the MIP images confirms the above findings. CTA ABDOMEN AND PELVIS FINDINGS VASCULAR Aorta: The thoracic aorta is patent without evidence for a dissection. There is an aortobifemoral bypass graft. There is some narrowing of the anterior stent. This degree of narrowing is similar to prior study. The limbs remain grossly patent. The native aorta is occluded. Celiac: The celiac axis is patent. The GDA is patent. SMA: There is a stent involving the proximal SMA. The stent is occluded. There is reconstitution distal to the stent. Renals: The native renal arteries are atretic. There is likely high-grade stenosis at the origin of the left renal artery. IMA: The proximal IMA is stenotic. Inflow: The iliac limbs of the  bypass graft are widely patent. There is reconstitution of the left external iliac artery and left internal iliac arteries via retrograde flow. The visualized portions of the SFA bilaterally are patent. Veins: No obvious venous abnormality within the limitations of this arterial phase study. Review of the MIP images confirms the above findings. NON-VASCULAR Hepatobiliary: The liver is normal. Normal gallbladder.There is no biliary ductal dilation. Pancreas: Normal contours without ductal dilatation. No peripancreatic fluid collection. Spleen: There is a low-attenuation area within the spleen measuring approximately 2.1 cm. Adrenals/Urinary Tract: --Adrenal glands: No adrenal hemorrhage. --Right kidney/ureter: The native kidney is atrophic. There is a hyperdense area arising from the upper pole measuring approximately 1.5 cm. --Left kidney/ureter: The left kidney is atrophic. --Urinary bladder: Unremarkable. Stomach/Bowel: --Stomach/Duodenum: No hiatal hernia or other gastric abnormality. Normal duodenal course and caliber. --Small bowel: No dilatation or inflammation. --Colon: No focal abnormality. --Appendix: Normal. Lymphatic: Normal course and caliber of the major abdominal vessels. --No retroperitoneal lymphadenopathy. --No mesenteric lymphadenopathy. --No pelvic or inguinal lymphadenopathy. Reproductive: Unremarkable Other: There is a small volume of free fluid in the abdomen. There is no free air. The abdominal wall is normal. Musculoskeletal. 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 IMPRESSION: 1. No acute thoracic or abdominal aortic dissection. 2. Bilateral pleural effusions, left greater than right, with partial collapse of the left lower lobe. 3. Cardiomegaly with generalized volume overload. 4. Airspace opacities in the right lower lobe concerning for pneumonia or aspiration. 5. Dialysis catheter with appropriate positioning. 6. Mediastinal and hilar  adenopathy, likely reactive. 7. Patent aortobifemoral bypass graft. 8. There is a 1.5 cm hyperdense area arising from the upper pole of the right kidney. While this could represent a hemorrhagic/proteinaceous cyst, a solid mass is not excluded. Recommend further evaluation with ultrasound on a nonemergent basis. 9. There is a 2.1 cm low-attenuation area within the spleen. This  could represent a splenic infarct. 10. Complete occlusion of the SMA stent. Per report, this was a finding present on the patient's outside CT dated 05/29/2019 from [facility] and therefore is chronic. 11. Small volume of free fluid in the abdomen pelvis. Aortic Atherosclerosis (ICD10-I70.0). Electronically Signed   By: Constance Holster M.D.   On: 07/20/2019 14:54    Pending Labs Unresulted Labs (From admission, onward)    Start     Ordered   07/21/19 0500  Procalcitonin  Daily,   STAT     07/20/19 1048          Vitals/Pain Today's Vitals   07/20/19 1330 07/20/19 1430 07/20/19 1515 07/20/19 1630  BP: (!) 167/107 (!) 155/110  (!) 166/127  Pulse: (!) 111 (!) 109 (!) 109 (!) 112  Resp: 17 19 14 14   Temp:      TempSrc:      SpO2: 94% 93% 96% 90%  PainSc:        Isolation Precautions No active isolations  Medications Medications  ondansetron (ZOFRAN) injection 4 mg (4 mg Intravenous Given 07/20/19 1036)  fentaNYL (SUBLIMAZE) injection 50 mcg (50 mcg Intravenous Given 07/20/19 1037)  iohexol (OMNIPAQUE) 350 MG/ML injection 75 mL (75 mLs Intravenous Contrast Given 07/20/19 1045)    Mobility walks Low fall risk   Focused Assessments Renal Assessment Handoff:  Hemodialysis Schedule: Hemodialysis Schedule: Tuesday/Thursday/Saturday Last Hemodialysis date and time: sat 07/18/19   Restricted appendage: left arm     R Recommendations: See Admitting Provider Note  Report given to:   Additional Notes:

## 2019-07-20 NOTE — ED Triage Notes (Signed)
Pt c/o CP that started this am. PT is dialysis on t/th/sat. PT had 324 Asprin and 1 nitro in route. MD at bedside

## 2019-07-20 NOTE — ED Notes (Signed)
Pt o2 increased to 4L [place] due to o2 sat of 88%. PT is 93% on 4L at this time. MD made aware

## 2019-07-20 NOTE — H&P (Signed)
[facility] NAME: Brett Small    MR#:  [id number]  DATE OF BIRTH:  05-Feb-1976  DATE OF ADMISSION:  07/20/2019  PRIMARY CARE PHYSICIAN: Patient, [name]   REQUESTING/REFERRING PHYSICIAN: [place]  CHIEF COMPLAINT:   Chief Complaint  Patient presents with  . Chest Pain    HISTORY OF PRESENT ILLNESS: Brett Small  is a 43 y.o. male with a known history of ESRD, hypertension, chronic kidney disease, secondary hyperparathyroidism of renal origin, AV shunt and fistula, aortofemoral stent which closed with cardiologist airplane and had to redo the procedure and patient is on anticoagulation for that.  He was admitted for pneumonia twice in the last 1 month.  These episodes are also associated with some pleural effusion and pericardial effusion along with fluid overload.  During last admission he also had cardiac catheterization done. He was doing fine after being discharged for 2 to 3 days but then started having shortness of breath again so came to emergency room.  He also have chest tightness or pain which is not associated with any activities. ER physician did CT scan of the chest which reported pleural effusion and some atelectasis of the lung or questionable pneumonia.  Patient's call with test is negative.  PAST MEDICAL HISTORY:   Past Medical History:  Diagnosis Date  . ESRD (end stage renal disease) ([place])   . Hypertension   . PAD (peripheral artery disease) ([facility])    Required aortofemoral stent-which had closed and had to redo the procedure and ischemia of limb.  . Peripheral vascular disease ([place])   . Renal disorder   . Secondary hyperparathyroidism of renal origin [facility])     PAST SURGICAL HISTORY:  Past Surgical History:  Procedure Laterality Date  . A/V SHUNT INTERVENTION Left 01/19/2019   Procedure: LEFT UPPER EXTREMITY A/V SHUNTOGRAM / UPPER EXTREMITY ANGIOGRAM;  Surgeon: Algernon Huxley, MD;  Location: [facility];   Service: Cardiovascular;  Laterality: Left;  . A/V SHUNT INTERVENTION Left 03/02/2019   Procedure: A/V SHUNT INTERVENTION;  Surgeon: Algernon Huxley, MD;  Location: [facility];  Service: Cardiovascular;  Laterality: Left;  . AORTA - FEMORAL ARTERY BYPASS GRAFT    . AV FISTULA PLACEMENT Left 12/26/2018   Procedure: INSERTION OF GORE STRETCH VASCULAR 4-7MM X  45CM IN LEFT UPPER ARM;  Surgeon: Marty Heck, MD;  Location: [facility];  Service: Vascular;  Laterality: Left;  . DIALYSIS/PERMA CATHETER REMOVAL N/A 02/06/2019   Procedure: DIALYSIS/PERMA CATHETER REMOVAL;  Surgeon: Algernon Huxley, MD;  Location: [place] [facility];  Service: Cardiovascular;  Laterality: N/A;  . LEFT HEART CATH AND CORONARY ANGIOGRAPHY Right 07/10/2019   Procedure: LEFT HEART CATH AND CORONARY ANGIOGRAPHY;  Surgeon: Dionisio David, MD;  Location: [facility];  Service: Cardiovascular;  Laterality: Right;    SOCIAL HISTORY:  Social History   Tobacco Use  . Smoking status: Current Every Day Smoker    Packs/day: 0.50    Years: 30.00    Pack years: 15.00    Types: Cigarettes    Last attempt to quit: 06/26/2018    Years since quitting: 1.0  . Smokeless tobacco: Never Used  . Tobacco comment: smoked for 30 years   Substance Use Topics  . Alcohol use: Not Currently    FAMILY HISTORY:  Family History  Problem Relation Age of Onset  . Hypertension Other   . Diabetes Other   . Clotting  disorder Father     DRUG ALLERGIES:  Allergies  Allergen Reactions  . Codeine Nausea Only    Patient questioned this (??)  . Sulfa Antibiotics Hives and Nausea And Vomiting    REVIEW OF SYSTEMS:   CONSTITUTIONAL: No fever, fatigue or weakness.  EYES: No blurred or double vision.  EARS, NOSE, AND THROAT: No tinnitus or ear pain.  RESPIRATORY: No cough, have shortness of breath, no wheezing or hemoptysis.  CARDIOVASCULAR: No chest pain, orthopnea, edema.  GASTROINTESTINAL: No nausea, vomiting, diarrhea or  abdominal pain.  GENITOURINARY: No dysuria, hematuria.  ENDOCRINE: No polyuria, nocturia,  HEMATOLOGY: No anemia, easy bruising or bleeding SKIN: No rash or lesion. MUSCULOSKELETAL: No joint pain or arthritis.   NEUROLOGIC: No tingling, numbness, weakness.  PSYCHIATRY: No anxiety or depression.   MEDICATIONS AT HOME:  Prior to Admission medications   Medication Sig Start Date End Date Taking? Authorizing Provider  amLODipine (NORVASC) 10 MG tablet Take 1 tablet (10 mg total) by mouth daily at 8 pm. 02/24/19  Yes Gladstone Lighter, MD  apixaban (ELIQUIS) 2.5 MG TABS tablet Take 1 tablet (2.5 mg total) by mouth 2 (two) times daily. 01/26/19  Yes Clapacs, Madie Reno, MD  aspirin EC 81 MG tablet Take 1 tablet (81 mg total) by mouth daily. 01/26/19  Yes Clapacs, Madie Reno, MD  atorvastatin (LIPITOR) 80 MG tablet Take 1 tablet (80 mg total) by mouth at bedtime. 01/26/19  Yes Clapacs, Madie Reno, MD  b complex-C-folic acid 1 MG capsule Take 1 capsule by mouth. With supper. 09/18/18  Yes [provider]  carvedilol (COREG) 25 MG tablet Take 1 tablet (25 mg total) by mouth 2 (two) times daily with a meal. 01/26/19  Yes Clapacs, Madie Reno, MD  gabapentin (NEURONTIN) 100 MG capsule Take 1 capsule (100 mg total) by mouth 3 (three) times daily. 01/26/19  Yes Clapacs, Madie Reno, MD  hydrALAZINE (APRESOLINE) 25 MG tablet Take 1 tablet (25 mg total) by mouth every 8 (eight) hours. 01/26/19  Yes Clapacs, Madie Reno, MD  hydrOXYzine (ATARAX/VISTARIL) 25 MG tablet Take 25 mg by mouth 3 (three) times daily as needed for anxiety.   Yes [provider]  loratadine (CLARITIN) 10 MG tablet Take 1 tablet (10 mg total) by mouth daily. 03/04/19  Yes Salary, Avel Peace, MD  multivitamin (RENA-VIT) TABS tablet Take 1 tablet by mouth daily. 01/27/19  Yes Clapacs, Madie Reno, MD  sevelamer carbonate (RENVELA) 800 MG tablet Take 2 tablets (1,600 mg total) by mouth 3 (three) times daily with meals. 01/26/19  Yes Clapacs, Madie Reno, MD  levofloxacin  (LEVAQUIN) 500 MG tablet Take 1 tablet (500mg ) by mouth on 9/22 and 9/24 AFTER dialysis Patient not taking: Reported on 07/20/2019 07/12/19   Mayo, Pete Pelt, MD  neomycin-bacitracin-polymyxin (NEOSPORIN) OINT Apply 1 application topically 2 (two) times daily. 01/26/19   Clapacs, Madie Reno, MD  Nutritional Supplements (FEEDING SUPPLEMENT, NEPRO CARB STEADY,) LIQD Take 237 mLs by mouth 2 (two) times daily between meals. 07/06/19   Epifanio Lesches, MD  traMADol (ULTRAM) 50 MG tablet Take 1 tablet (50 mg total) by mouth every 12 (twelve) hours as needed for moderate pain or severe pain. Patient not taking: Reported on 07/20/2019 03/03/19   Salary, Holly Bodily D, MD      PHYSICAL EXAMINATION:   VITAL SIGNS: Blood pressure (!) 142/116, pulse (!) 110, temperature 97.7 F (36.5 C), temperature source Oral, resp. rate 18, height 5\' 1"  (1.549 m), weight 48.7 kg, SpO2 98 %.  GENERAL:  43 y.o.-year-old patient lying in the bed with no acute distress.  EYES: Pupils equal, round, reactive to light and accommodation. No scleral icterus. Extraocular muscles intact.  HEENT: Head atraumatic, normocephalic. Oropharynx and nasopharynx clear.  NECK:  Supple, no jugular venous distention. No thyroid enlargement, no tenderness.  LUNGS: Decreased breath sounds bilaterally in lower lobes, no wheezing, rales,rhonchi or crepitation. No use of accessory muscles of respiration.  Right upper chest dialysis catheter present. CARDIOVASCULAR: S1, S2 normal. No murmurs, rubs, or gallops.  ABDOMEN: Soft, nontender, nondistended. Bowel sounds present. No organomegaly or mass.  EXTREMITIES: No pedal edema, cyanosis, or clubbing.  NEUROLOGIC: Cranial nerves II through XII are intact. Muscle strength 4/5 in all extremities. Sensation intact. Gait not checked.  PSYCHIATRIC: The patient is alert and oriented x 3.  SKIN: No obvious rash, lesion, or ulcer.   LABORATORY PANEL:   CBC Recent Labs  Lab 07/20/19 1029  WBC 11.0*  HGB 9.8*   HCT 31.2*  PLT 234  MCV 102.6*  MCH 32.2  MCHC 31.4  RDW 17.3*  LYMPHSABS 1.1  MONOABS 0.4  EOSABS 0.1  BASOSABS 0.1   ------------------------------------------------------------------------------------------------------------------  Chemistries  Recent Labs  Lab 07/20/19 1029  NA 138  K 3.6  CL 102  CO2 24  GLUCOSE 111*  BUN 30*  CREATININE 4.39*  CALCIUM 9.4  AST 26  ALT 18  ALKPHOS 81  BILITOT 1.8*   ------------------------------------------------------------------------------------------------------------------ estimated creatinine clearance is 14.6 mL/min (A) (by C-G formula based on SCr of 4.39 mg/dL (H)). ------------------------------------------------------------------------------------------------------------------ No results for input(s): TSH, T4TOTAL, T3FREE, THYROIDAB in the last 72 hours.  Invalid input(s): FREET3   Coagulation profile Recent Labs  Lab 07/20/19 1029  INR 1.2   ------------------------------------------------------------------------------------------------------------------- No results for input(s): DDIMER in the last 72 hours. -------------------------------------------------------------------------------------------------------------------  Cardiac Enzymes No results for input(s): CKMB, TROPONINI, MYOGLOBIN in the last 168 hours.  Invalid input(s): CK ------------------------------------------------------------------------------------------------------------------ Invalid input(s): POCBNP  ---------------------------------------------------------------------------------------------------------------  Urinalysis No results found for: COLORURINE, APPEARANCEUR, LABSPEC, PHURINE, GLUCOSEU, HGBUR, BILIRUBINUR, KETONESUR, PROTEINUR, UROBILINOGEN, NITRITE, LEUKOCYTESUR   RADIOLOGY: US Renal  Result Date: 07/20/2019 CLINICAL DATA:  Attempt to evaluate mass seen on prior CT within the right kidney. EXAM: RENAL / URINARY TRACT  ULTRASOUND COMPLETE COMPARISON:  CT a CAP July 20, 2019 FINDINGS: Right Kidney: Renal measurements: 6.6 x 2.9 x 3.9 cm = volume: 39 mL. Kidney is atrophic. Within the superior pole of the right kidney there is a 2.1 x 1.8 x 1.5 Cm heterogeneous masslike structure. No hydronephrosis. Left Kidney: Renal measurements: 6.1 x 2.9 x 2.4 cm = volume: 23 mL. Kidney is atrophic. No hydronephrosis. Bladder: Appears normal for degree of bladder distention. Bilateral pleural effusions.  Small volume ascites. IMPRESSION: Focal masslike area involving the superior pole of the right kidney. This is incompletely characterized on current examination. This may represent a true renal mass or potentially non atrophic renal parenchyma. In the nonacute setting, recommend further evaluation with pre and post contrast-enhanced CT or MRI for definitive characterization. Electronically Signed   By: Lovey Newcomer M.D.   On: 07/20/2019 18:43   Dg Chest Portable 1 View  Result Date: 07/20/2019 CLINICAL DATA:  Chest pain. EXAM: PORTABLE CHEST 1 VIEW COMPARISON:  07/09/2019 FINDINGS: There is a moderate to large left-sided pleural effusion with adjacent compressive atelectasis. There is persistent cardiomegaly. There are scattered airspace opacities throughout the right lung zone which have improved from prior study. There is no pneumothorax. There is a trace right-sided pleural effusion. There  is a well-positioned tunneled right-sided dialysis catheter. A vascular stent projects over the patient's axilla. IMPRESSION: 1. Moderate to large left-sided pleural effusion, increased in size from prior study. 2. Improving airspace opacity throughout the right lung field. 3. Well-positioned tunneled dialysis catheter. Electronically Signed   By: Constance Holster M.D.   On: 07/20/2019 10:34   Ct Angio Chest/abd/pel For Dissection W And/or Wo Contrast  Result Date: 07/20/2019 CLINICAL DATA:  Chest pain. Prior triple area EXAM: CT ANGIOGRAPHY  CHEST, ABDOMEN AND PELVIS TECHNIQUE: Multidetector CT imaging through the chest, abdomen and pelvis was performed using the standard protocol during bolus administration of intravenous contrast. Multiplanar reconstructed images and MIPs were obtained and reviewed to evaluate the vascular anatomy. CONTRAST:  63mL OMNIPAQUE IOHEXOL 350 MG/ML SOLN COMPARISON:  May 19, 2019 FINDINGS: CTA CHEST FINDINGS Cardiovascular: There is no large centrally located pulmonary embolism. The heart size is mildly enlarged. There is no aortic dissection. There is a tunneled dialysis catheter with appropriate positioning. The intracardiac blood pool is hypodense relative to the adjacent myocardium consistent with anemia. There is a small pericardial effusion. Mediastinum/Nodes: --there are enlarged mediastinal and hilar lymph nodes. --there are no pathologically enlarged axillary lymph nodes. --No supraclavicular lymphadenopathy. --Normal thyroid gland. --The esophagus is unremarkable Lungs/Pleura: There is a moderate right and large left pleural effusion. There is partial collapse of the left lower lobe. There are scattered airspace opacities in the right lower lobe. There is interlobular septal thickening bilaterally. There is no pneumothorax. There is debris within the upper trachea. Musculoskeletal: No chest wall abnormality. No acute or significant osseous findings. Review of the MIP images confirms the above findings. CTA ABDOMEN AND PELVIS FINDINGS VASCULAR Aorta: The thoracic aorta is patent without evidence for a dissection. There is an aortobifemoral bypass graft. There is some narrowing of the anterior stent. This degree of narrowing is similar to prior study. The limbs remain grossly patent. The native aorta is occluded. Celiac: The celiac axis is patent. The GDA is patent. SMA: There is a stent involving the proximal SMA. The stent is occluded. There is reconstitution distal to the stent. Renals: The native renal arteries  are atretic. There is likely high-grade stenosis at the origin of the left renal artery. IMA: The proximal IMA is stenotic. Inflow: The iliac limbs of the bypass graft are widely patent. There is reconstitution of the left external iliac artery and left internal iliac arteries via retrograde flow. The visualized portions of the SFA bilaterally are patent. Veins: No obvious venous abnormality within the limitations of this arterial phase study. Review of the MIP images confirms the above findings. NON-VASCULAR Hepatobiliary: The liver is normal. Normal gallbladder.There is no biliary ductal dilation. Pancreas: Normal contours without ductal dilatation. No peripancreatic fluid collection. Spleen: There is a low-attenuation area within the spleen measuring approximately 2.1 cm. Adrenals/Urinary Tract: --Adrenal glands: No adrenal hemorrhage. --Right kidney/ureter: The native kidney is atrophic. There is a hyperdense area arising from the upper pole measuring approximately 1.5 cm. --Left kidney/ureter: The left kidney is atrophic. --Urinary bladder: Unremarkable. Stomach/Bowel: --Stomach/Duodenum: No hiatal hernia or other gastric abnormality. Normal duodenal course and caliber. --Small bowel: No dilatation or inflammation. --Colon: No focal abnormality. --Appendix: Normal. Lymphatic: Normal course and caliber of the major abdominal vessels. --No retroperitoneal lymphadenopathy. --No mesenteric lymphadenopathy. --No pelvic or inguinal lymphadenopathy. Reproductive: Unremarkable Other: There is a small volume of free fluid in the abdomen. There is no free air. The abdominal wall is normal. Musculoskeletal. 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 IMPRESSION: 1. No acute thoracic or abdominal aortic dissection. 2. Bilateral pleural effusions, left greater than right, with partial collapse of the left lower lobe. 3. Cardiomegaly with generalized volume overload. 4. Airspace  opacities in the right lower lobe concerning for pneumonia or aspiration. 5. Dialysis catheter with appropriate positioning. 6. Mediastinal and hilar adenopathy, likely reactive. 7. Patent aortobifemoral bypass graft. 8. There is a 1.5 cm hyperdense area arising from the upper pole of the right kidney. While this could represent a hemorrhagic/proteinaceous cyst, a solid mass is not excluded. Recommend further evaluation with ultrasound on a nonemergent basis. 9. There is a 2.1 cm low-attenuation area within the spleen. This could represent a splenic infarct. 10. Complete occlusion of the SMA stent. Per report, this was a finding present on the patient's outside CT dated 05/29/2019 from [facility] and therefore is chronic. 11. Small volume of free fluid in the abdomen pelvis. Aortic Atherosclerosis (ICD10-I70.0). Electronically Signed   By: Constance Holster M.D.   On: 07/20/2019 14:54    EKG: Orders placed or performed during the hospital encounter of 07/20/19  . EKG 12-Lead  . EKG 12-Lead    IMPRESSION AND PLAN:  *Bilateral pleural effusion Causing chest tightness and chest pain As this is recurrent issue, I will call pulmonary consult- Dr.Kasa is made aware by text message. Most likely patient would need to have thoracentesis. He is on Eliquis for vascular issues. I will hold Eliquis for now and start on heparin IV drip for anticipation of procedure.  *Atelectasis versus pneumonia in right lower lobe I will start on Unasyn and let pulmonologist decide on further antibiotics needed.  *End-stage renal disease on hemodialysis With fluid overload Nephrologist is contacted for continuing hemodialysis in the hospital.  *Hypertension Continue carvedilol, amlodipine, hydralazine  *Peripheral arterial disease-multiple procedures, aortic aneurysm and aortofemoral bypass with closure later on causing limb ischemia requiring stenting. On Eliquis, holding now in anticipation of procedure.   Continue heparin IV drip.  *Active smoking Counseled to quit smoking for 4 minutes and offered nicotine patch.   All the records are reviewed and case discussed with ED provider. Management plans discussed with the patient, family and they are in agreement.  CODE STATUS: DNR    Code Status Orders  (From admission, onward)         Start     Ordered   07/20/19 2042  Do not attempt resuscitation (DNR)  Continuous    Question Answer Comment  In the event of cardiac or respiratory ARREST Do not call a "code blue"   In the event of cardiac or respiratory ARREST Do not perform Intubation, CPR, defibrillation or ACLS   In the event of cardiac or respiratory ARREST Use medication by any route, position, wound care, and other measures to relive pain and suffering. May use oxygen, suction and manual treatment of airway obstruction as needed for comfort.      07/20/19 2041        Code Status History    Date Active Date Inactive Code Status Order ID Comments User Context   07/09/2019 0543 07/12/2019 1502 Full Code [id number]  Harrie Foreman, MD Inpatient   07/02/2019 2128 07/06/2019 1557 Full Code [id number]  Henreitta Leber, MD Inpatient   05/19/2019 0603 05/22/2019 1957 Full Code [id number]  Lance Coon, MD Inpatient   02/28/2019 1805 03/03/2019 2234 Full Code [id number]  Vaughan Basta, MD Inpatient  02/21/2019 0835 02/24/2019 1945 Full Code [id number]  Harrie Foreman, MD Inpatient   02/04/2019 1343 02/09/2019 2050 Full Code [id number]  Saundra Shelling, MD ED   01/27/2019 0654 01/29/2019 2049 Partial Code [id number]  Harrie Foreman, MD Inpatient   01/07/2019 2156 01/27/2019 0638 Partial Code [id number]  Tennis Ship, MD Inpatient   12/25/2018 0158 01/07/2019 2027 Partial Code [id number]  Ina Homes, MD ED   Advance Care Planning Activity       TOTAL TIME TAKING CARE OF THIS PATIENT: 50 minutes.    Vaughan Basta M.D on 07/20/2019   Between 7am to 6pm - Pager -  [phone]  After 6pm go to [email or web address] - password [email or web address] [place] [facility]  Office  [phone]  CC: Primary care physician; Patient, [name]   Note: This dictation was prepared with Dragon dictation along with smaller phrase technology. Any transcriptional errors that result from this process are unintentional.

## 2019-07-20 NOTE — ED Notes (Signed)
Pt difficult stick, MD with [place] at bedside

## 2019-07-20 NOTE — ED Provider Notes (Signed)
[facility] Emergency Department Provider Note  ____________________________________________   First MD Initiated Contact with Patient 07/20/19 1003     (approximate)  I have reviewed the triage vital signs and the nursing notes.   HISTORY  Chief Complaint Chest Pain    HPI Brett Small is a 43 y.o. male with ESRD Tuesdays Thursdays Saturday last dialyzed on Saturday who now presents with chest pain.  Patient had severe chest pain that started around 4 AM that radiates into his back.  Is associate with some mild shortness of breath.  Patient is on a blood thinner.  Pain is severe, constant, nothing makes it better, nothing makes it worse.  He had a full session of dialysis on Saturday.  The chest pain is associated with some shortness of breath.  Patient did have a recent admission for hypoxic respiratory failure secondary to hospital-acquired pneumonia.  He was discharged on 9/20 on Levaquin.  Patient did have a cardiac catheterization on oh 9/18 with normal coronaries.          Past Medical History:  Diagnosis Date  . ESRD (end stage renal disease) ([place])   . Hypertension   . Renal disorder   . Secondary hyperparathyroidism of renal origin [facility])     Patient Active Problem List   Diagnosis Date Noted  . NSTEMI (non-ST elevated myocardial infarction) ([place]) 07/09/2019  . Pneumonia 07/02/2019  . Acute liver failure 05/19/2019  . Hematemesis 05/19/2019  . Hepatitis   . Thrombocytopenia ([place])   . Coagulopathy ([place])   . Sepsis ([place]) 02/28/2019  . Lobar pneumonia ([place]) 02/28/2019  . Acute respiratory failure ([place]) 02/04/2019  . Acute respiratory failure with hypoxemia ([place]) 01/27/2019  . Depression 01/07/2019  . MDD (major depressive disorder), single episode, severe , no psychosis ([place])   . Homelessness   . Acute respiratory failure with hypoxia ([place]) 12/25/2018  . End stage renal disease on dialysis ([place]) 12/25/2018  . Hypertension  12/25/2018  . Renal osteodystrophy 12/25/2018    Past Surgical History:  Procedure Laterality Date  . A/V SHUNT INTERVENTION Left 01/19/2019   Procedure: LEFT UPPER EXTREMITY A/V SHUNTOGRAM / UPPER EXTREMITY ANGIOGRAM;  Surgeon: Algernon Huxley, MD;  Location: [place] [facility];  Service: Cardiovascular;  Laterality: Left;  . A/V SHUNT INTERVENTION Left 03/02/2019   Procedure: A/V SHUNT INTERVENTION;  Surgeon: Algernon Huxley, MD;  Location: [place] [facility];  Service: Cardiovascular;  Laterality: Left;  . AORTA - FEMORAL ARTERY BYPASS GRAFT    . AV FISTULA PLACEMENT Left 12/26/2018   Procedure: INSERTION OF GORE STRETCH VASCULAR 4-7MM X  45CM IN LEFT UPPER ARM;  Surgeon: Marty Heck, MD;  Location: [place];  Service: Vascular;  Laterality: Left;  . DIALYSIS/PERMA CATHETER REMOVAL N/A 02/06/2019   Procedure: DIALYSIS/PERMA CATHETER REMOVAL;  Surgeon: Algernon Huxley, MD;  Location: [place] [facility];  Service: Cardiovascular;  Laterality: N/A;  . LEFT HEART CATH AND CORONARY ANGIOGRAPHY Right 07/10/2019   Procedure: LEFT HEART CATH AND CORONARY ANGIOGRAPHY;  Surgeon: Dionisio David, MD;  Location: [place] [facility];  Service: Cardiovascular;  Laterality: Right;    Prior to Admission medications   Medication Sig Start Date End Date Taking? Authorizing Provider  amLODipine (NORVASC) 10 MG tablet Take 1 tablet (10 mg total) by mouth daily at 8 pm. 02/24/19   Gladstone Lighter, MD  apixaban (ELIQUIS) 2.5 MG TABS tablet Take 1 tablet (2.5 mg total) by mouth 2 (two) times daily. 01/26/19  Clapacs, Madie Reno, MD  aspirin EC 81 MG tablet Take 1 tablet (81 mg total) by mouth daily. 01/26/19   Clapacs, Madie Reno, MD  atorvastatin (LIPITOR) 80 MG tablet Take 1 tablet (80 mg total) by mouth at bedtime. 01/26/19   Clapacs, Madie Reno, MD  b complex-C-folic acid 1 MG capsule Take 1 capsule by mouth. With supper. 09/18/18   [provider]  carvedilol (COREG) 25 MG tablet Take 1 tablet (25 mg total) by  mouth 2 (two) times daily with a meal. 01/26/19   Clapacs, Madie Reno, MD  gabapentin (NEURONTIN) 100 MG capsule Take 1 capsule (100 mg total) by mouth 3 (three) times daily. 01/26/19   Clapacs, Madie Reno, MD  hydrALAZINE (APRESOLINE) 25 MG tablet Take 1 tablet (25 mg total) by mouth every 8 (eight) hours. 01/26/19   Clapacs, Madie Reno, MD  hydrOXYzine (ATARAX/VISTARIL) 25 MG tablet Take 25 mg by mouth 3 (three) times daily as needed for anxiety.    [provider]  levofloxacin (LEVAQUIN) 500 MG tablet Take 1 tablet (500mg ) by mouth on 9/22 and 9/24 AFTER dialysis 07/12/19   Mayo, Pete Pelt, MD  loratadine (CLARITIN) 10 MG tablet Take 1 tablet (10 mg total) by mouth daily. 03/04/19   Salary, Holly Bodily D, MD  multivitamin (RENA-VIT) TABS tablet Take 1 tablet by mouth daily. 01/27/19   Clapacs, Madie Reno, MD  neomycin-bacitracin-polymyxin (NEOSPORIN) OINT Apply 1 application topically 2 (two) times daily. 01/26/19   Clapacs, Madie Reno, MD  Nutritional Supplements (FEEDING SUPPLEMENT, NEPRO CARB STEADY,) LIQD Take 237 mLs by mouth 2 (two) times daily between meals. 07/06/19   Epifanio Lesches, MD  sevelamer carbonate (RENVELA) 800 MG tablet Take 2 tablets (1,600 mg total) by mouth 3 (three) times daily with meals. 01/26/19   Clapacs, Madie Reno, MD  traMADol (ULTRAM) 50 MG tablet Take 1 tablet (50 mg total) by mouth every 12 (twelve) hours as needed for moderate pain or severe pain. 03/03/19   Salary, Avel Peace, MD    Allergies Codeine and Sulfa antibiotics  Family History  Problem Relation Age of Onset  . Hypertension Other   . Diabetes Other   . Clotting disorder Father     Social History Social History   Tobacco Use  . Smoking status: Current Every Day Smoker    Packs/day: 0.50    Years: 30.00    Pack years: 15.00    Types: Cigarettes    Last attempt to quit: 06/26/2018    Years since quitting: 1.0  . Smokeless tobacco: Never Used  . Tobacco comment: smoked for 30 years   Substance Use Topics  . Alcohol  use: Not Currently  . Drug use: Yes    Frequency: 1.0 times per week    Types: Marijuana    Comment: Pt states "maybe once or twice a week".       Review of Systems Constitutional: No fever/chills Eyes: No visual changes. ENT: No sore throat. Cardiovascular: Positive chest pain Respiratory: Positive shortness of breath Gastrointestinal: No abdominal pain.  No nausea, no vomiting.  No diarrhea.  No constipation. Genitourinary: Negative for dysuria. Musculoskeletal: Negative for back pain. Skin: Negative for rash. Neurological: Negative for headaches, focal weakness or numbness. All other ROS negative ____________________________________________   PHYSICAL EXAM:  VITAL SIGNS: Blood pressure (!) 167/101, pulse (!) 109, temperature 98.4 F (36.9 C), temperature source Oral, resp. rate 17, SpO2 93 %.   Constitutional: Alert but does appear in pain. Eyes: Conjunctivae are normal. EOMI.  Head: Atraumatic. Nose: No congestion/rhinnorhea. Mouth/Throat: Mucous membranes are moist.   Neck: No stridor. Trachea Midline. FROM Cardiovascular: Tachycardic, regular rhythm. Grossly normal heart sounds.  Good peripheral circulation.  Dialysis catheter on chest wall Respiratory: Normal respiratory effort.  No retractions.  Crackles of the left Gastrointestinal: Soft and nontender. No distention. No abdominal bruits.  Musculoskeletal: 1+ edema bilaterally.  No joint effusions.  Old fistula on the left arm Neurologic:  Normal speech and language. No gross focal neurologic deficits are appreciated.  Skin:  Skin is warm, dry and intact. No rash noted. Psychiatric: Mood and affect are normal. Speech and behavior are normal. GU: Deferred   ____________________________________________   LABS (all labs ordered are listed, but only abnormal results are displayed)  Labs Reviewed  CBC WITH DIFFERENTIAL/PLATELET - Abnormal; Notable for the following components:      Result Value   WBC 11.0 (*)     RBC 3.04 (*)    Hemoglobin 9.8 (*)    HCT 31.2 (*)    MCV 102.6 (*)    RDW 17.3 (*)    Neutro Abs 9.3 (*)    All other components within normal limits  COMPREHENSIVE METABOLIC PANEL - Abnormal; Notable for the following components:   Glucose, Bld 111 (*)    BUN 30 (*)    Creatinine, Ser 4.39 (*)    Albumin 3.1 (*)    Total Bilirubin 1.8 (*)    GFR calc non Af Amer 15 (*)    GFR calc Af Amer 18 (*)    All other components within normal limits  LIPASE, BLOOD - Abnormal; Notable for the following components:   Lipase 72 (*)    All other components within normal limits  PROTIME-INR - Abnormal; Notable for the following components:   Prothrombin Time 15.4 (*)    All other components within normal limits  BRAIN NATRIURETIC PEPTIDE - Abnormal; Notable for the following components:   B Natriuretic Peptide >4,500.0 (*)    All other components within normal limits  TROPONIN I (HIGH SENSITIVITY) - Abnormal; Notable for the following components:   Troponin I (High Sensitivity) 67 (*)    All other components within normal limits  TROPONIN I (HIGH SENSITIVITY) - Abnormal; Notable for the following components:   Troponin I (High Sensitivity) 59 (*)    All other components within normal limits  SARS CORONAVIRUS 2 (HOSPITAL ORDER, [place] LAB)  APTT  PROCALCITONIN  TYPE AND SCREEN   ____________________________________________   ED ECG REPORT I, Vanessa [place], the attending physician, personally viewed and interpreted this ECG.  EKG is sinus tachycardia rate of 125, no ST elevation, no T wave inversion, QTC of 495, ____________________________________________  RADIOLOGY I, Vanessa [place], personally viewed and evaluated these images (plain radiographs) as part of my medical decision making, as well as reviewing the written report by the radiologist.  ED MD interpretation: Patient does have a moderate to large left-sided pleural effusion that is increased in  size.  Official radiology report(s): Dg Chest Portable 1 View  Result Date: 07/20/2019 CLINICAL DATA:  Chest pain. EXAM: PORTABLE CHEST 1 VIEW COMPARISON:  07/09/2019 FINDINGS: There is a moderate to large left-sided pleural effusion with adjacent compressive atelectasis. There is persistent cardiomegaly. There are scattered airspace opacities throughout the right lung zone which have improved from prior study. There is no pneumothorax. There is a trace right-sided pleural effusion. There is a well-positioned tunneled right-sided dialysis catheter. A vascular stent projects over  the patient's axilla. IMPRESSION: 1. Moderate to large left-sided pleural effusion, increased in size from prior study. 2. Improving airspace opacity throughout the right lung field. 3. Well-positioned tunneled dialysis catheter. Electronically Signed   By: Constance Holster M.D.   On: 07/20/2019 10:34    ____________________________________________   PROCEDURES  Procedure(s) performed (including Critical Care):  Ultrasound ED Peripheral IV (Provider)  Date/Time: 07/20/2019 10:46 AM Performed by: Vanessa [place], MD Authorized by: Vanessa [place], MD   Procedure details:    Indications: multiple failed IV attempts     Location:  Left AC   Angiocath:  20 G   Bedside Ultrasound Guided: Yes     Images: not archived     Patient tolerated procedure without complications: No     Dressing applied: Yes    .Critical Care Performed by: Vanessa [place], MD Authorized by: Vanessa , MD   Critical care provider statement:    Critical care time (minutes):  30   Critical care was necessary to treat or prevent imminent or life-threatening deterioration of the following conditions:  Respiratory failure   Critical care was time spent personally by me on the following activities:  Discussions with consultants, evaluation of patient's response to treatment, examination of patient, ordering and performing treatments and  interventions, ordering and review of laboratory studies, ordering and review of radiographic studies, pulse oximetry, re-evaluation of patient's condition, obtaining history from patient or surrogate and review of old charts     ____________________________________________   INITIAL IMPRESSION / ASSESSMENT AND PLAN / ED COURSE   Fermon Fron was evaluated in Emergency Department on 07/20/2019 for the symptoms described in the history of present illness. He was evaluated in the context of the global COVID-19 pandemic, which necessitated consideration that the patient might be at risk for infection with the SARS-CoV-2 virus that causes COVID-19. Institutional protocols and algorithms that pertain to the evaluation of patients at risk for COVID-19 are in a state of rapid change based on information released by regulatory bodies including the CDC and federal and state organizations. These policies and algorithms were followed during the patient's care in the ED.    Patient presents with chest pain.  Patient did have a catheterization done recently that was negative per patient.  However I am concerned that the pain is rating his chest and he is hypertensive that this could be chest pain secondary to dissection given prior history of AAA.  Will get CT dissection to further evaluate.  He denies any abdominal pain.  Will get cardiac markers to evaluate for ACS.  Patient is ready on a blood thinner therefore lower suspicion for pulmonary embolism.  Consider this being secondary to pneumonia, pleural effusion as well.  Chest x-ray with worsening pleural effusion.  Added on a procalcitonin given the recent bacterial pneumonia which was negative therefore low suspicion that there is a pneumonia underneath this given normal procalcitonin and no fever.  Troponin was 59 which is lower than prior and patient already had a recent catheterization that was negative.  BNP was elevated secondary to patient being  dialysis patient.  11:44 AM now on 4L.  Patient does not appear to be any distress.  His pain is gotten much improved with the medicine.  On reevaluation patient was sleeping in the room.  We are having difficulties getting CT read.  Have called CT multiple times in order to try to get the read.  CT scans negative for dissection.  Patient does  have large bilateral pleural effusions left greater than right.  Discussed with nephrology team [place].  Given the effusion recommended admission.  He will come down to see the patient.  He may need thoracentesis.  We will discuss to the hospital team for admission.  ____________________________________________   FINAL CLINICAL IMPRESSION(S) / ED DIAGNOSES   Final diagnoses:  Acute respiratory failure with hypoxia (HCC)  Pleural effusion     MEDICATIONS GIVEN DURING THIS VISIT:  Medications  ondansetron (ZOFRAN) injection 4 mg (4 mg Intravenous Given 07/20/19 1036)  fentaNYL (SUBLIMAZE) injection 50 mcg (50 mcg Intravenous Given 07/20/19 1037)  iohexol (OMNIPAQUE) 350 MG/ML injection 75 mL (75 mLs Intravenous Contrast Given 07/20/19 1045)     ED Discharge Orders    None       Note:  This document was prepared using Dragon voice recognition software and may include unintentional dictation errors.   Vanessa [place], MD 07/20/19 [phone]

## 2019-07-20 NOTE — Consult Note (Signed)
Pharmacy Antibiotic Note  Brett Small is a 43 y.o. male admitted on 07/20/2019 with aspiration pneumonia  with ESRD Tuesdays Thursdays Saturday last dialyzed on Saturday who now presents with chest pain.      Pharmacy has been consulted for Unasyn dosing.  Plan: Will dose Unasyn 3g q24h after HD on HD days    Temp (24hrs), Avg:98.4 F (36.9 C), Min:98.4 F (36.9 C), Max:98.4 F (36.9 C)  Recent Labs  Lab 07/20/19 1029  WBC 11.0*  CREATININE 4.39*    Estimated Creatinine Clearance: 13.6 mL/min (A) (by C-G formula based on SCr of 4.39 mg/dL (H)).    Allergies  Allergen Reactions  . Codeine Nausea Only    Patient questioned this (??)  . Sulfa Antibiotics Hives and Nausea And Vomiting    Antimicrobials this admission: 9/28 Unasyn >>  Dose adjustments this admission: None  Microbiology results: COVID NEG  Thank you for allowing pharmacy to be a part of this patient's care.  Lu Duffel, PharmD, BCPS Clinical Pharmacist 07/20/2019 7:09 PM

## 2019-07-20 NOTE — Progress Notes (Signed)
Family Meeting Note  Advance Directive:yes  Today a meeting took place with the Patient.  The following clinical team members were present during this meeting:MD  The following were discussed:Patient's diagnosis: Bilateral pleural effusion, recurrent pneumonia, fluid overload, end-stage renal disease on hemodialysis, multiple vascular procedures with aortofemoral bypass surgery and closure requiring stenting, Patient's progosis: Unable to determine and Goals for treatment: DNR  Additional follow-up to be provided: Nephrology, pulmonary  Time spent during discussion:20 minutes  Vaughan Basta, MD

## 2019-07-20 NOTE — ED Notes (Signed)
Pt has dentures, has placed in pink denture holder and is in his belongings bag

## 2019-07-20 NOTE — ED Notes (Signed)
Patient transported to CT 

## 2019-07-21 ENCOUNTER — Other Ambulatory Visit: Payer: Self-pay

## 2019-07-21 ENCOUNTER — Inpatient Hospital Stay: Payer: Medicare Other

## 2019-07-21 LAB — BASIC METABOLIC PANEL
Anion gap: 7 (ref 5–15)
BUN: 33 mg/dL — ABNORMAL HIGH (ref 6–20)
CO2: 27 mmol/L (ref 22–32)
Calcium: 8.6 mg/dL — ABNORMAL LOW (ref 8.9–10.3)
Chloride: 105 mmol/L (ref 98–111)
Creatinine, Ser: 4.14 mg/dL — ABNORMAL HIGH (ref 0.61–1.24)
GFR calc Af Amer: 19 mL/min — ABNORMAL LOW (ref >60)
GFR calc non Af Amer: 16 mL/min — ABNORMAL LOW (ref >60)
Glucose, Bld: 96 mg/dL (ref 70–99)
Potassium: 3.9 mmol/L (ref 3.5–5.1)
Sodium: 139 mmol/L (ref 135–145)

## 2019-07-21 LAB — BODY FLUID CELL COUNT WITH DIFFERENTIAL
Eos, Fluid: 0 %
Lymphs, Fluid: 26 %
Monocyte-Macrophage-Serous Fluid: 6 %
Neutrophil Count, Fluid: 68 %
Other Cells, Fluid: 0 %
Total Nucleated Cell Count, Fluid: 156 cu mm

## 2019-07-21 LAB — CBC
HCT: 26.1 % — ABNORMAL LOW (ref 39.0–52.0)
Hemoglobin: 8.2 g/dL — ABNORMAL LOW (ref 13.0–17.0)
MCH: 32.4 pg (ref 26.0–34.0)
MCHC: 31.4 g/dL (ref 30.0–36.0)
MCV: 103.2 fL — ABNORMAL HIGH (ref 80.0–100.0)
Platelets: 153 10*3/uL (ref 150–400)
RBC: 2.53 MIL/uL — ABNORMAL LOW (ref 4.22–5.81)
RDW: 17.4 % — ABNORMAL HIGH (ref 11.5–15.5)
WBC: 3.9 10*3/uL — ABNORMAL LOW (ref 4.0–10.5)
nRBC: 0 % (ref 0.0–0.2)

## 2019-07-21 LAB — PROTEIN, PLEURAL OR PERITONEAL FLUID: Total protein, fluid: <3 g/dL

## 2019-07-21 LAB — PROCALCITONIN: Procalcitonin: 0.21 ng/mL

## 2019-07-21 LAB — APTT
aPTT: 47 seconds — ABNORMAL HIGH (ref 24–36)
aPTT: 59 seconds — ABNORMAL HIGH (ref 24–36)

## 2019-07-21 LAB — GLUCOSE, PLEURAL OR PERITONEAL FLUID: Glucose, Fluid: 113 mg/dL

## 2019-07-21 LAB — PATHOLOGIST SMEAR REVIEW

## 2019-07-21 LAB — HEPARIN LEVEL (UNFRACTIONATED): Heparin Unfractionated: 0.15 IU/mL — ABNORMAL LOW (ref 0.30–0.70)

## 2019-07-21 IMAGING — US US THORACENTESIS ASP PLEURAL SPACE W/IMG GUIDE
1 series · 4 of 4 positions shown · non-contrast
Comparison: none

INDICATION: Pleural effusion

[Series 1: us thoracentesis asp pleural space w/img guide · 4 of 4 slices shown]
[im 1/4]
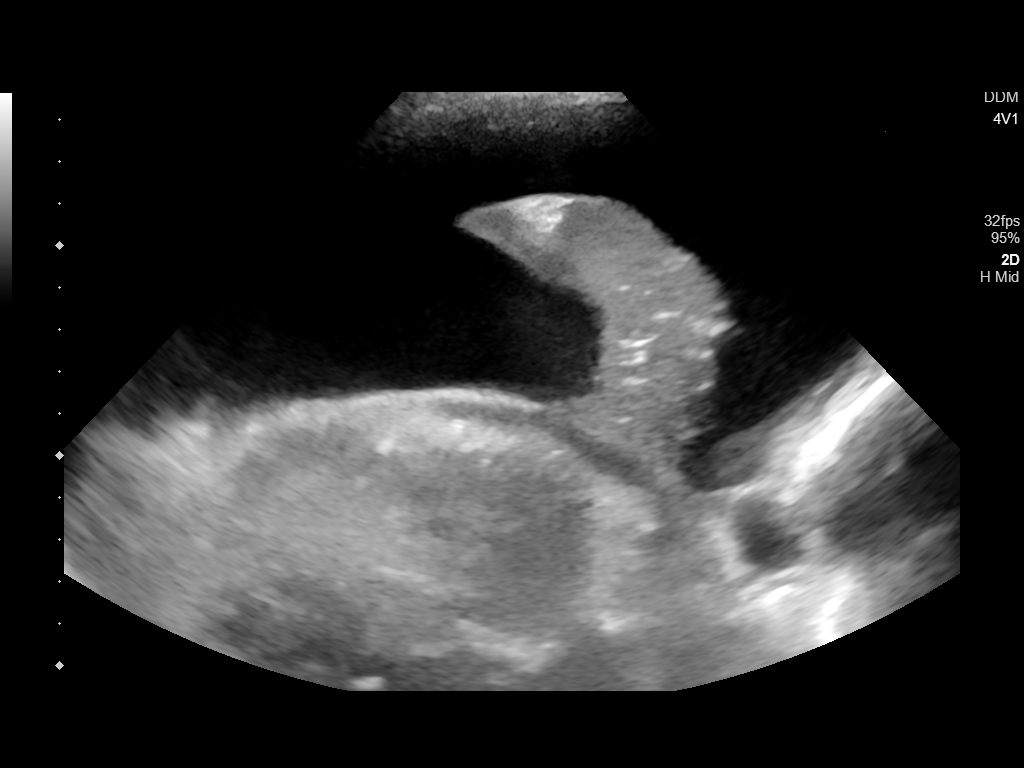
[im 2/4]
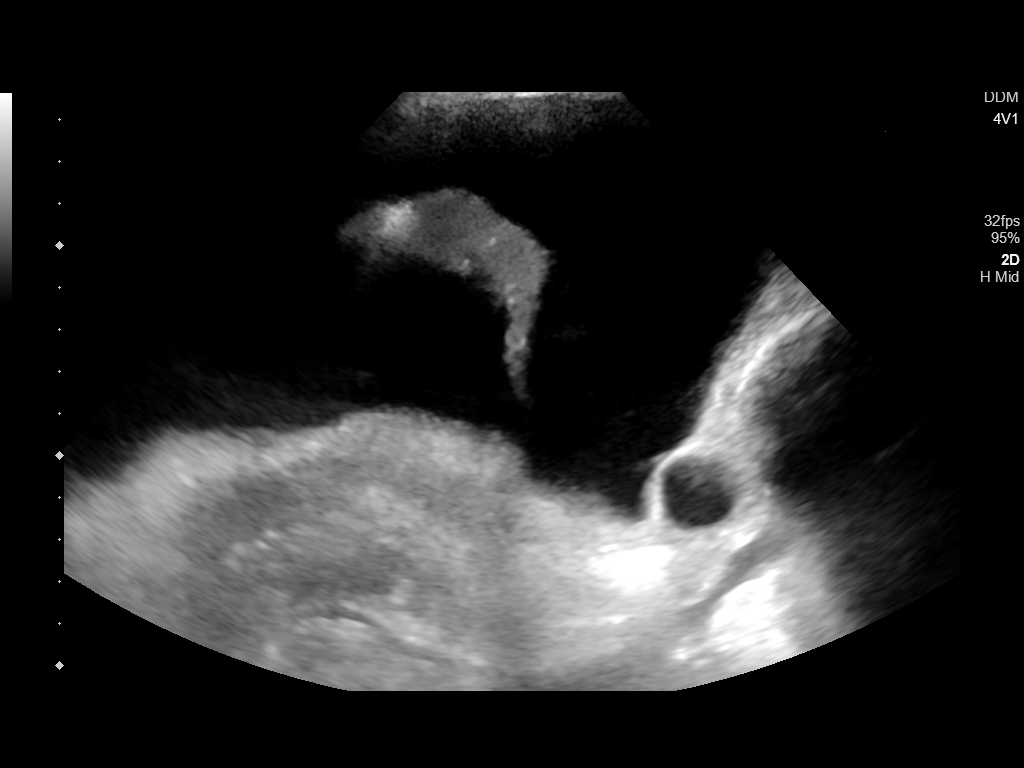
[im 3/4]
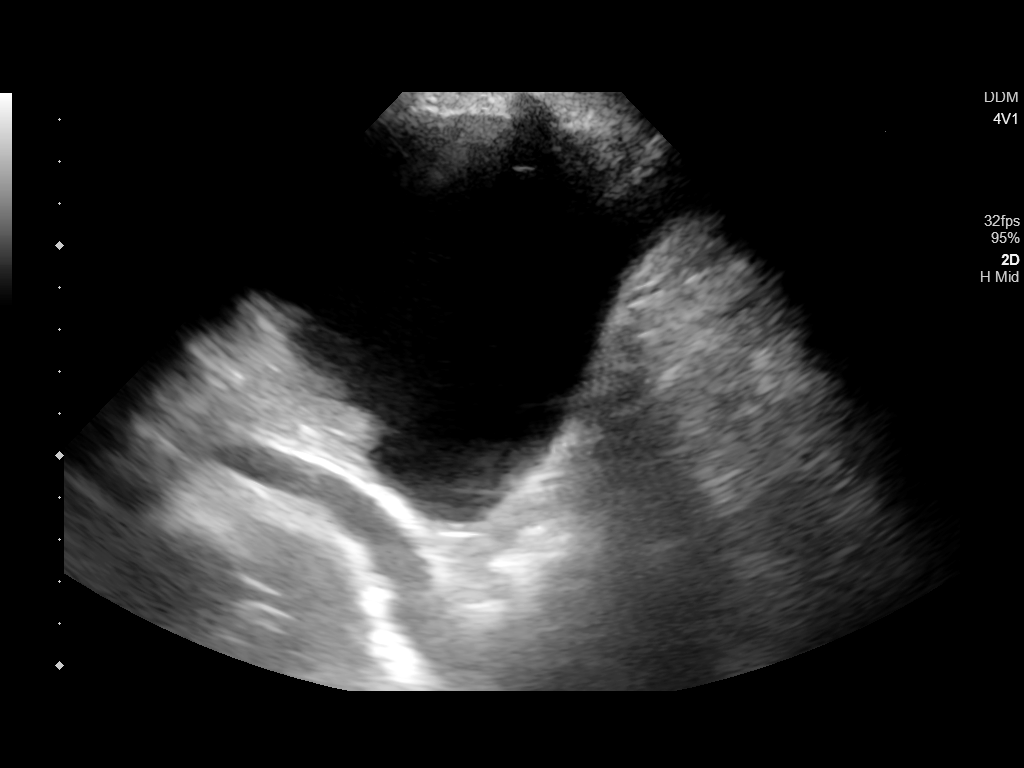
[im 4/4]
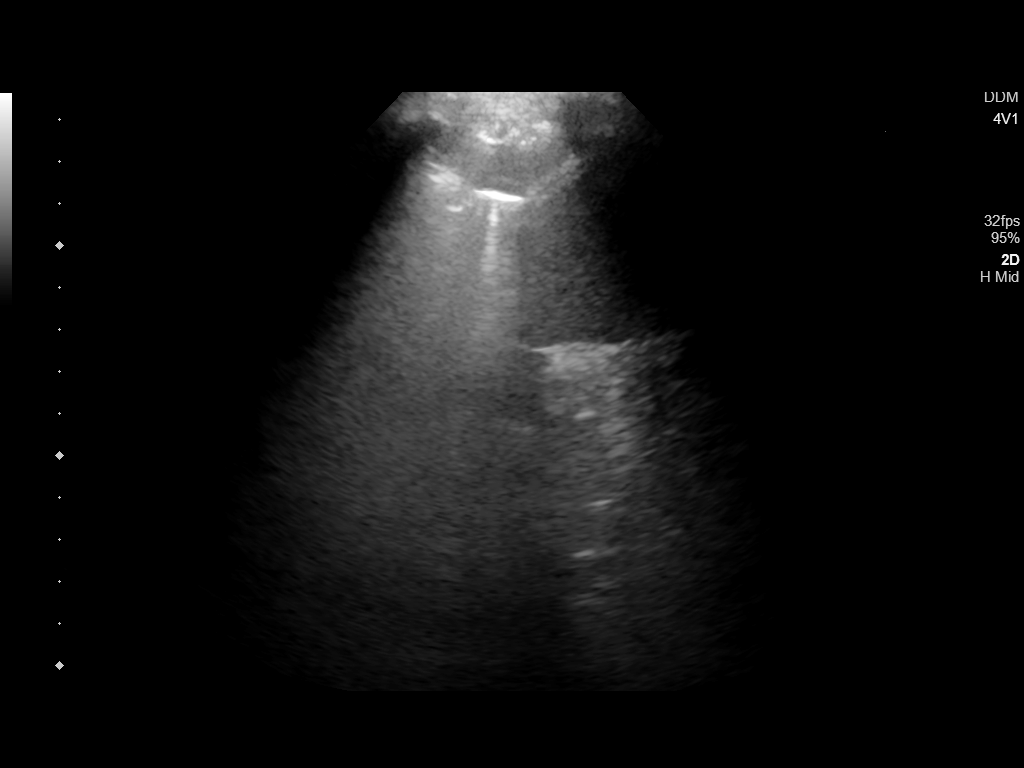

[4 of 4 positions shown; findings below may reference images not displayed]

EXAM:
ULTRASOUND GUIDED LEFT THORACENTESIS

MEDICATIONS:
None.

COMPLICATIONS:
None.

PROCEDURE:
An ultrasound guided thoracentesis was thoroughly discussed with the
patient and questions answered. The benefits, risks, alternatives
and complications were also discussed. The patient understands and
wishes to proceed with the procedure. Written consent was obtained.

Ultrasound was performed to localize and mark an adequate pocket of
fluid in the left chest. The area was then prepped and draped in the
normal sterile fashion. 1% Lidocaine was used for local anesthesia.
Under ultrasound guidance a 6 French catheter was introduced.
Thoracentesis was performed. The catheter was removed and a dressing
applied.
FINDINGS: A total of approximately 1.5 L of amber fluid was removed. Samples
were sent to the laboratory as requested by the clinical team.
IMPRESSION: Successful ultrasound guided left thoracentesis yielding 1.5 L of
pleural fluid.

## 2019-07-21 MED ORDER — HEPARIN BOLUS VIA INFUSION
1000.0000 [IU] | Freq: Once | INTRAVENOUS | Status: AC
  Administered 2019-07-21: 1000 [IU] via INTRAVENOUS
  Filled 2019-07-21: qty 1000

## 2019-07-21 NOTE — Progress Notes (Signed)
[facility]  ROUNDING NOTE   Subjective:   [name] admitted to [facility] on 07/20/2019 for Pleural effusion [J90] Renal mass [N28.89] Acute respiratory failure with hypoxia ([place]) [J96.01]  Last hemodialysis was Saturday.   Objective:  Vital signs in last 24 hours:  Temp:  [97.7 F (36.5 C)-98.7 F (37.1 C)] 98 F (36.7 C) (09/29 1225) Pulse Rate:  [90-112] 90 (09/29 1225) Resp:  [14-19] 16 (09/29 1225) BP: (118-169)/(81-127) 118/86 (09/29 1225) SpO2:  [90 %-98 %] 97 % (09/29 1225) Weight:  [48.7 kg] 48.7 kg (09/28 2024)  Weight change:  Filed Weights   07/20/19 2024  Weight: 48.7 kg    Intake/Output: I/O last 3 completed shifts: In: 143 [I.V.:43; IV Piggyback:100] Out: 100 [Urine:100]   Intake/Output this shift:  Total I/O In: 120 [P.O.:120] Out: -   Physical Exam: General: NAD,   Head: Normocephalic, atraumatic. Moist oral mucosal membranes  Eyes: Anicteric, PERRL  Neck: Supple, trachea midline  Lungs:  Right diminished.   Heart: Regular rate and rhythm  Abdomen:  Soft, nontender,   Extremities:  no peripheral edema.  Neurologic: Nonfocal, moving all four extremities  Skin: No lesions  Access: RIJ permcath    Basic Metabolic Panel: Recent Labs  Lab 07/20/19 1029  NA 138  K 3.6  CL 102  CO2 24  GLUCOSE 111*  BUN 30*  CREATININE 4.39*  CALCIUM 9.4    Liver Function Tests: Recent Labs  Lab 07/20/19 1029  AST 26  ALT 18  ALKPHOS 81  BILITOT 1.8*  PROT 7.2  ALBUMIN 3.1*   Recent Labs  Lab 07/20/19 1029  LIPASE 72*   No results for input(s): AMMONIA in the last 168 hours.  CBC: Recent Labs  Lab 07/20/19 1029  WBC 11.0*  NEUTROABS 9.3*  HGB 9.8*  HCT 31.2*  MCV 102.6*  PLT 234    Cardiac Enzymes: No results for input(s): CKTOTAL, CKMB, CKMBINDEX, TROPONINI in the last 168 hours.  BNP: Invalid input(s): POCBNP  CBG: No results for input(s): GLUCAP in the last 168 hours.  Microbiology: Results for  orders placed or performed during the hospital encounter of 07/20/19  SARS Coronavirus 2 [facility] order, Performed in [facility] hospital lab) Nasopharyngeal Nasopharyngeal Swab     Status: None   Collection Time: 07/20/19 10:29 AM   Specimen: Nasopharyngeal Swab  Result Value Ref Range Status   SARS Coronavirus 2 NEGATIVE NEGATIVE Final    Comment: (NOTE) If result is NEGATIVE SARS-CoV-2 target nucleic acids are NOT DETECTED. The SARS-CoV-2 RNA is generally detectable in upper and lower  respiratory specimens during the acute phase of infection. The lowest  concentration of SARS-CoV-2 viral copies this assay can detect is 250  copies / mL. A negative result does not preclude SARS-CoV-2 infection  and should not be used as the sole basis for treatment or other  patient management decisions.  A negative result may occur with  improper specimen collection / handling, submission of specimen other  than nasopharyngeal swab, presence of viral mutation(s) within the  areas targeted by this assay, and inadequate number of viral copies  (<250 copies / mL). A negative result must be combined with clinical  observations, patient history, and epidemiological information. If result is POSITIVE SARS-CoV-2 target nucleic acids are DETECTED. The SARS-CoV-2 RNA is generally detectable in upper and lower  respiratory specimens dur ing the acute phase of infection.  Positive  results are indicative of active infection with SARS-CoV-2.  Clinical  correlation with patient history and other diagnostic information is  necessary to determine patient infection status.  Positive results do  not rule out bacterial infection or co-infection with other viruses. If result is PRESUMPTIVE POSTIVE SARS-CoV-2 nucleic acids MAY BE PRESENT.   A presumptive positive result was obtained on the submitted specimen  and confirmed on repeat testing.  While 2019 novel coronavirus  (SARS-CoV-2) nucleic acids may be present  in the submitted sample  additional confirmatory testing may be necessary for epidemiological  and / or clinical management purposes  to differentiate between  SARS-CoV-2 and other Sarbecovirus currently known to infect humans.  If clinically indicated additional testing with an alternate test  methodology [phone]) is advised. The SARS-CoV-2 RNA is generally  detectable in upper and lower respiratory sp ecimens during the acute  phase of infection. The expected result is Negative. Fact Sheet for Patients:  StrictlyIdeas.no Fact Sheet for Healthcare Providers: BankingDealers.co.za This test is not yet approved or cleared by the [place] FDA and has been authorized for detection and/or diagnosis of SARS-CoV-2 by FDA under an Emergency Use Authorization (EUA).  This EUA will remain in effect (meaning this test can be used) for the duration of the COVID-19 declaration under Section 564(b)(1) of the Act, 21 U.S.C. section 360bbb-3(b)(1), unless the authorization is terminated or revoked sooner. Performed at [facility], [address] [place], [place] [address]     Coagulation Studies: Recent Labs    07/20/19 1029  LABPROT 15.4*  INR 1.2    Urinalysis: No results for input(s): COLORURINE, LABSPEC, PHURINE, GLUCOSEU, HGBUR, BILIRUBINUR, KETONESUR, PROTEINUR, UROBILINOGEN, NITRITE, LEUKOCYTESUR in the last 72 hours.  Invalid input(s): APPERANCEUR    Imaging: US Renal  Result Date: 07/20/2019 CLINICAL DATA:  Attempt to evaluate mass seen on prior CT within the right kidney. EXAM: RENAL / URINARY TRACT ULTRASOUND COMPLETE COMPARISON:  CT a CAP July 20, 2019 FINDINGS: Right Kidney: Renal measurements: 6.6 x 2.9 x 3.9 cm = volume: 39 mL. Kidney is atrophic. Within the superior pole of the right kidney there is a 2.1 x 1.8 x 1.5 Cm heterogeneous masslike structure. No hydronephrosis. Left Kidney: Renal measurements:  6.1 x 2.9 x 2.4 cm = volume: 23 mL. Kidney is atrophic. No hydronephrosis. Bladder: Appears normal for degree of bladder distention. Bilateral pleural effusions.  Small volume ascites. IMPRESSION: Focal masslike area involving the superior pole of the right kidney. This is incompletely characterized on current examination. This may represent a true renal mass or potentially non atrophic renal parenchyma. In the nonacute setting, recommend further evaluation with pre and post contrast-enhanced CT or MRI for definitive characterization. Electronically Signed   By: Lovey Newcomer M.D.   On: 07/20/2019 18:43   Dg Chest Portable 1 View  Result Date: 07/20/2019 CLINICAL DATA:  Chest pain. EXAM: PORTABLE CHEST 1 VIEW COMPARISON:  07/09/2019 FINDINGS: There is a moderate to large left-sided pleural effusion with adjacent compressive atelectasis. There is persistent cardiomegaly. There are scattered airspace opacities throughout the right lung zone which have improved from prior study. There is no pneumothorax. There is a trace right-sided pleural effusion. There is a well-positioned tunneled right-sided dialysis catheter. A vascular stent projects over the patient's axilla. IMPRESSION: 1. Moderate to large left-sided pleural effusion, increased in size from prior study. 2. Improving airspace opacity throughout the right lung field. 3. Well-positioned tunneled dialysis catheter. Electronically Signed   By: Constance Holster M.D.   On: 07/20/2019 10:34   Ct Angio Chest/abd/pel For  Dissection W And/or Wo Contrast  Result Date: 07/20/2019 CLINICAL DATA:  Chest pain. Prior triple area EXAM: CT ANGIOGRAPHY CHEST, ABDOMEN AND PELVIS TECHNIQUE: Multidetector CT imaging through the chest, abdomen and pelvis was performed using the standard protocol during bolus administration of intravenous contrast. Multiplanar reconstructed images and MIPs were obtained and reviewed to evaluate the vascular anatomy. CONTRAST:  27mL OMNIPAQUE  IOHEXOL 350 MG/ML SOLN COMPARISON:  May 19, 2019 FINDINGS: CTA CHEST FINDINGS Cardiovascular: There is no large centrally located pulmonary embolism. The heart size is mildly enlarged. There is no aortic dissection. There is a tunneled dialysis catheter with appropriate positioning. The intracardiac blood pool is hypodense relative to the adjacent myocardium consistent with anemia. There is a small pericardial effusion. Mediastinum/Nodes: --there are enlarged mediastinal and hilar lymph nodes. --there are no pathologically enlarged axillary lymph nodes. --No supraclavicular lymphadenopathy. --Normal thyroid gland. --The esophagus is unremarkable Lungs/Pleura: There is a moderate right and large left pleural effusion. There is partial collapse of the left lower lobe. There are scattered airspace opacities in the right lower lobe. There is interlobular septal thickening bilaterally. There is no pneumothorax. There is debris within the upper trachea. Musculoskeletal: No chest wall abnormality. No acute or significant osseous findings. Review of the MIP images confirms the above findings. CTA ABDOMEN AND PELVIS FINDINGS VASCULAR Aorta: The thoracic aorta is patent without evidence for a dissection. There is an aortobifemoral bypass graft. There is some narrowing of the anterior stent. This degree of narrowing is similar to prior study. The limbs remain grossly patent. The native aorta is occluded. Celiac: The celiac axis is patent. The GDA is patent. SMA: There is a stent involving the proximal SMA. The stent is occluded. There is reconstitution distal to the stent. Renals: The native renal arteries are atretic. There is likely high-grade stenosis at the origin of the left renal artery. IMA: The proximal IMA is stenotic. Inflow: The iliac limbs of the bypass graft are widely patent. There is reconstitution of the left external iliac artery and left internal iliac arteries via retrograde flow. The visualized portions of  the SFA bilaterally are patent. Veins: No obvious venous abnormality within the limitations of this arterial phase study. Review of the MIP images confirms the above findings. NON-VASCULAR Hepatobiliary: The liver is normal. Normal gallbladder.There is no biliary ductal dilation. Pancreas: Normal contours without ductal dilatation. No peripancreatic fluid collection. Spleen: There is a low-attenuation area within the spleen measuring approximately 2.1 cm. Adrenals/Urinary Tract: --Adrenal glands: No adrenal hemorrhage. --Right kidney/ureter: The native kidney is atrophic. There is a hyperdense area arising from the upper pole measuring approximately 1.5 cm. --Left kidney/ureter: The left kidney is atrophic. --Urinary bladder: Unremarkable. Stomach/Bowel: --Stomach/Duodenum: No hiatal hernia or other gastric abnormality. Normal duodenal course and caliber. --Small bowel: No dilatation or inflammation. --Colon: No focal abnormality. --Appendix: Normal. Lymphatic: Normal course and caliber of the major abdominal vessels. --No retroperitoneal lymphadenopathy. --No mesenteric lymphadenopathy. --No pelvic or inguinal lymphadenopathy. Reproductive: Unremarkable Other: There is a small volume of free fluid in the abdomen. There is no free air. The abdominal wall is normal. Musculoskeletal. 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 IMPRESSION: 1. No acute thoracic or abdominal aortic dissection. 2. Bilateral pleural effusions, left greater than right, with partial collapse of the left lower lobe. 3. Cardiomegaly with generalized volume overload. 4. Airspace opacities in the right lower lobe concerning for pneumonia or aspiration. 5. Dialysis catheter with appropriate positioning.  6. Mediastinal and hilar adenopathy, likely reactive. 7. Patent aortobifemoral bypass graft. 8. There is a 1.5 cm hyperdense area arising from the upper pole of the right kidney. While this could  represent a hemorrhagic/proteinaceous cyst, a solid mass is not excluded. Recommend further evaluation with ultrasound on a nonemergent basis. 9. There is a 2.1 cm low-attenuation area within the spleen. This could represent a splenic infarct. 10. Complete occlusion of the SMA stent. Per report, this was a finding present on the patient's outside CT dated 05/29/2019 from [facility] and therefore is chronic. 11. Small volume of free fluid in the abdomen pelvis. Aortic Atherosclerosis (ICD10-I70.0). Electronically Signed   By: Constance Holster M.D.   On: 07/20/2019 14:54     Medications:   . heparin 800 Units/hr (07/21/19 0318)   . amLODipine  10 mg Oral Q2000  . aspirin EC  81 mg Oral Daily  . atorvastatin  80 mg Oral QHS  . carvedilol  25 mg Oral BID WC  . Chlorhexidine Gluconate Cloth  6 each Topical Daily  . feeding supplement (NEPRO CARB STEADY)  237 mL Oral BID BM  . gabapentin  100 mg Oral TID  . hydrALAZINE  25 mg Oral Q8H  . loratadine  10 mg Oral Daily  . multivitamin  1 tablet Oral QHS  . sevelamer carbonate  1,600 mg Oral TID WC   acetaminophen, docusate sodium, hydrOXYzine, oxyCODONE-acetaminophen  Assessment/ Plan:  [name] is a 43 y.o. white malewith end stage renal disease on hemodialysis, hypertension, peripheral vascular disease, depression who is admitted to [facility] on 07/09/2019 for pneumonia  [place]. TTS 46.5 kg Left AVG  1. End Stage Renal Disease: - Dialysis for later today.   2. Pleural effusions: thoracentesis scheduled.   3. Anemia of chronic kidney disease: hemoglobin 9.8 - EPO with HD treatment  4. Hypertension: 118/86. Well controlled.  - amlodipine, carvedilol and hydralazine.  5. Secondary Hyperparathyroidism - sevelamer   LOS: 1 Chiyo Fay 9/29/20201:10 PM

## 2019-07-21 NOTE — TOC Initial Note (Signed)
Transition of Care [facility]) - Initial/Assessment Note    Patient Details  Name: Brett Small MRN: [id number] Date of Birth: 1975-12-22  Transition of Care [facility]) CM/SW Contact:    Beverly Sessions, RN Phone Number: 07/21/2019, 2:47 PM  Clinical Narrative:                 RNCM attempted to assess patient. Patient off the floor.  Will Re attempt         Patient Goals and CMS Choice        Expected Discharge Plan and Services           Expected Discharge Date: 07/22/19                                    Prior Living Arrangements/Services                       Activities of Daily Living Home Assistive Devices/Equipment: Dentures (specify type), Eyeglasses, Walker (specify type), Cane (specify quad or straight) ADL Screening (condition at time of admission) Patient's cognitive ability adequate to safely complete daily activities?: Yes Is the patient deaf or have difficulty hearing?: No Does the patient have difficulty seeing, even when wearing glasses/contacts?: No Does the patient have difficulty concentrating, remembering, or making decisions?: No Patient able to express need for assistance with ADLs?: No Does the patient have difficulty dressing or bathing?: No Independently performs ADLs?: Yes (appropriate for developmental age) Communication: Independent Dressing (OT): Independent Grooming: Independent Feeding: Independent Bathing: Independent Toileting: Independent In/Out Bed: Independent Walks in Home: Independent Does the patient have difficulty walking or climbing stairs?: Yes Weakness of Legs: Both Weakness of Arms/Hands: Both  Permission Sought/Granted                  Emotional Assessment              Admission diagnosis:  Pleural effusion [J90] Renal mass [N28.89] Acute respiratory failure with hypoxia ([place]) [J96.01] Patient Active Problem List   Diagnosis Date Noted  . Pleural effusion 07/20/2019  . NSTEMI (non-ST  elevated myocardial infarction) ([place]) 07/09/2019  . Pneumonia 07/02/2019  . Acute liver failure 05/19/2019  . Hematemesis 05/19/2019  . Hepatitis   . Thrombocytopenia ([place])   . Coagulopathy ([place])   . Sepsis ([place]) 02/28/2019  . Lobar pneumonia ([place]) 02/28/2019  . Acute respiratory failure ([place]) 02/04/2019  . Acute respiratory failure with hypoxemia ([place]) 01/27/2019  . Depression 01/07/2019  . MDD (major depressive disorder), single episode, severe , no psychosis ([place])   . Homelessness   . Acute respiratory failure with hypoxia ([place]) 12/25/2018  . End stage renal disease on dialysis ([place]) 12/25/2018  . Hypertension 12/25/2018  . Renal osteodystrophy 12/25/2018   PCP:  Patient, [name] Pharmacy:   [facility], [place] [address] [place] [address] Phone: [phone] Fax: [phone]  [facility] [address], [place] - [place] [address] Phone: [phone] Fax: [phone]  [place], [place] [address] Phone: [phone] Fax: [phone]     Social Determinants of Health (SDOH) Interventions    Readmission Risk Interventions Readmission Risk Prevention Plan 07/03/2019 05/21/2019 03/03/2019  Transportation Screening Complete Complete -  Medication Review Press photographer) Complete  Complete -  PCP or Specialist appointment within 3-5 days of discharge (No Data) - Complete  PCP/Specialist Appt Not Complete comments - - -  HRI or Home Care Consult Complete - -  SW Recovery Care/Counseling Consult - - -  Palliative Care Screening - Not Applicable -  [place] Not Applicable Not Applicable -

## 2019-07-21 NOTE — Consult Note (Signed)
[place] for Heparin Indication: History of aortic aneurysm surgery and thrombus, holding Eliquis for procedure  Allergies  Allergen Reactions  . Codeine Nausea Only    Patient questioned this (??)  . Sulfa Antibiotics Hives and Nausea And Vomiting    Patient Measurements: Height: 5\' 1"  (154.9 cm) Weight: 107 lb 5.8 oz (48.7 kg) IBW/kg (Calculated) : 52.3 Heparin Dosing Weight: 45.4kg  Vital Signs: Temp: 97.7 F (36.5 C) (09/28 2024) Temp Source: Oral (09/28 2024) BP: 142/116 (09/28 2024) Pulse Rate: 110 (09/28 2024)  Labs: Recent Labs    07/20/19 1029 07/20/19 1156 07/21/19 0150  HGB 9.8*  --   --   HCT 31.2*  --   --   PLT 234  --   --   APTT 27  --  47*  LABPROT 15.4*  --   --   INR 1.2  --   --   HEPARINUNFRC <0.10*  --   --   CREATININE 4.39*  --   --   TROPONINIHS 67* 59*  --     Estimated Creatinine Clearance: 14.6 mL/min (A) (by C-G formula based on SCr of 4.39 mg/dL (H)).   Medical History: Past Medical History:  Diagnosis Date  . ESRD (end stage renal disease) ([place])   . Hypertension   . PAD (peripheral artery disease) ([facility])    Required aortofemoral stent-which had closed and had to redo the procedure and ischemia of limb.  . Peripheral vascular disease ([place])   . Renal disorder   . Secondary hyperparathyroidism of renal origin ([place])     Medications:  Pt on Eliquis 2.5mg  bid prior to admission -  Last dose 9/27 @ 2000  Assessment: Pt with possible expected procedure (possible thoracentesis) - will transition to heparin drip from Eliquis per pharmacy protocol.  It has been > 12 hours since last Eliquis dose, so safe to start therapy.   9/29 @ 0150 aPTT = 47, subtherapeutic  Goal of Therapy:  Heparin level 0.3-0.7 units/ml Monitor platelets by anticoagulation protocol: Yes   Plan:  Will give a smaller bolus of 1000 units given relatively recent DOAC usage and renal function, and increase infusion to 800  units/hr  Will check APTT in 6 hours and f/u with heparin level and APTT with am labs.  Ena Dawley, PharmD Clinical Pharmacist 07/21/2019 2:44 AM

## 2019-07-21 NOTE — Progress Notes (Addendum)
[facility] NAME: Brett Small    MR#:  [id number]  DATE OF BIRTH:  04-13-1976  SUBJECTIVE:   Patient states he is feeling little bit better today he denies any active chest pain.  He still feels quite short of breath, and this is worse when he moves around.  He denies any cough, fevers, or chills.  REVIEW OF SYSTEMS:  Review of Systems  Constitutional: Negative for chills and fever.  HENT: Negative for congestion and sore throat.   Eyes: Negative for blurred vision and double vision.  Respiratory: Positive for shortness of breath. Negative for cough.   Cardiovascular: Negative for chest pain and palpitations.  Gastrointestinal: Negative for nausea and vomiting.  Musculoskeletal: Negative for back pain and neck pain.  Neurological: Negative for dizziness and headaches.  Psychiatric/Behavioral: Negative for depression. The patient is not nervous/anxious.     DRUG ALLERGIES:   Allergies  Allergen Reactions  . Codeine Nausea Only    Patient questioned this (??)  . Sulfa Antibiotics Hives and Nausea And Vomiting   VITALS:  Blood pressure (!) 123/91, pulse 85, temperature 98.1 F (36.7 C), temperature source Oral, resp. rate 20, height 5\' 1"  (1.549 m), weight 48.7 kg, SpO2 96 %. PHYSICAL EXAMINATION:  Physical Exam  GENERAL:  Laying in the bed with no acute distress.  Chronically ill-appearing. HEENT: Head atraumatic, normocephalic. Pupils equal, round, reactive to light and accommodation. No scleral icterus. Extraocular muscles intact. Oropharynx and nasopharynx clear.  NECK:  Supple, no jugular venous distention. No thyroid enlargement. LUNGS: + Diminished breath sounds throughout all lung fields, no crackles or wheezing. No use of accessory muscles of respiration.  Nasal cannula in place. CARDIOVASCULAR: RRR, S1, S2 normal. No murmurs, rubs, or gallops.  ABDOMEN: Soft, nontender, nondistended. Bowel sounds present.   EXTREMITIES: No pedal edema, cyanosis, or clubbing.  NEUROLOGIC: CN 2-12 intact, no focal deficits. 5/5 muscle strength throughout all extremities. Sensation intact throughout. Gait not checked.  PSYCHIATRIC: The patient is alert and oriented x 3.  SKIN: No obvious rash, lesion, or ulcer.  LABORATORY PANEL:  Male CBC Recent Labs  Lab 07/20/19 1029  WBC 11.0*  HGB 9.8*  HCT 31.2*  PLT 234   ------------------------------------------------------------------------------------------------------------------ Chemistries  Recent Labs  Lab 07/20/19 1029  NA 138  K 3.6  CL 102  CO2 24  GLUCOSE 111*  BUN 30*  CREATININE 4.39*  CALCIUM 9.4  AST 26  ALT 18  ALKPHOS 81  BILITOT 1.8*   RADIOLOGY:  US Renal  Result Date: 07/20/2019 CLINICAL DATA:  Attempt to evaluate mass seen on prior CT within the right kidney. EXAM: RENAL / URINARY TRACT ULTRASOUND COMPLETE COMPARISON:  CT a CAP July 20, 2019 FINDINGS: Right Kidney: Renal measurements: 6.6 x 2.9 x 3.9 cm = volume: 39 mL. Kidney is atrophic. Within the superior pole of the right kidney there is a 2.1 x 1.8 x 1.5 Cm heterogeneous masslike structure. No hydronephrosis. Left Kidney: Renal measurements: 6.1 x 2.9 x 2.4 cm = volume: 23 mL. Kidney is atrophic. No hydronephrosis. Bladder: Appears normal for degree of bladder distention. Bilateral pleural effusions.  Small volume ascites. IMPRESSION: Focal masslike area involving the superior pole of the right kidney. This is incompletely characterized on current examination. This may represent a true renal mass or potentially non atrophic renal parenchyma. In the nonacute setting, recommend further evaluation with pre and post contrast-enhanced CT or MRI for definitive characterization. Electronically Signed  By: Lovey Newcomer M.D.   On: 07/20/2019 18:43   Dg Chest Port 1 View  Result Date: 07/21/2019 CLINICAL DATA:  Status post left thoracentesis. EXAM: PORTABLE CHEST 1 VIEW COMPARISON:   Chest x-ray 07/20/2019. FINDINGS: Dialysis catheter noted stable position. Cardiomegaly with mild bilateral interstitial prominence again noted. Findings suggest CHF. Pneumonitis cannot be excluded. Previous identified large left pleural effusion has been removed by thoracentesis. No pneumothorax post thoracentesis. Left upper extremity vascular stent graft noted. IMPRESSION: 1. Left thoracentesis with significant clearing of pleural effusions. No evidence of pneumothorax post thoracentesis. 2.  Dialysis catheter stable position. 3. Cardiomegaly with bilateral interstitial prominence again noted. Findings suggest CHF. Electronically Signed   By: Marcello Moores  Register   On: 07/21/2019 14:20   US Thoracentesis Asp Pleural Space W/img Guide  Result Date: 07/21/2019 INDICATION: Pleural effusion EXAM: ULTRASOUND GUIDED LEFT THORACENTESIS MEDICATIONS: None. COMPLICATIONS: None. PROCEDURE: An ultrasound guided thoracentesis was thoroughly discussed with the patient and questions answered. The benefits, risks, alternatives and complications were also discussed. The patient understands and wishes to proceed with the procedure. Written consent was obtained. Ultrasound was performed to localize and mark an adequate pocket of fluid in the left chest. The area was then prepped and draped in the normal sterile fashion. 1% Lidocaine was used for local anesthesia. Under ultrasound guidance a 6 French catheter was introduced. Thoracentesis was performed. The catheter was removed and a dressing applied. FINDINGS: A total of approximately 1.5 L of amber fluid was removed. Samples were sent to the laboratory as requested by the clinical team. IMPRESSION: Successful ultrasound guided left thoracentesis yielding 1.5 L of pleural fluid. Electronically Signed   By: Marcello Moores  Register   On: 07/21/2019 14:07   ASSESSMENT AND PLAN:   Acute hypoxic respiratory failure secondary to bilateral pleural effusion- likely due to CHF and ESRD.  No  cough, fevers, or leukocytosis to suggest pneumonia. -Bilateral thoracentesis ordered with labs -Can restart Eliquis after procedure -Stop unasyn -Wean O2 as able  Right renal mass- incidental finding. -CTA chest/abdomen/pelvis with 1.5 cm hyperdense area in the upper pole of the right kidney -Renal ultrasound with focal masslike area of the superior pole of the right kidney -Discussed with urology- Dr. Bernardo Heater will see him as an outpatient for further evaluation.  ESRD on HD TTS -Nephrology following -Plan for HD today  Hypertension- BP well controlled -Continue carvedilol, amlodipine, hydralazine  Peripheral arterial disease- multiple procedures, aortic aneurysm and aortofemoral bypass with closure later on causing limb ischemia requiring stenting. -Restart Eliquis today after thoracentesis  Tobacco use -Tobacco cessation counseling performed this admission  All the records are reviewed and case discussed with Care Management/Social Worker. Management plans discussed with the patient, family and they are in agreement.  CODE STATUS: DNR  TOTAL TIME TAKING CARE OF THIS PATIENT: 40 minutes.   More than 50% of the time was spent in counseling/coordination of care: YES  POSSIBLE D/C IN 1-2 DAYS, DEPENDING ON CLINICAL CONDITION.   Berna Spare Salil Raineri M.D on 07/21/2019 at 3:19 PM  Between 7am to 6pm - Pager - [phone]  After 6pm go to [email or web address] - Proofreader  [facility]  Office  [phone]  CC: Primary care physician; Patient, [name]  Note: This dictation was prepared with Dragon dictation along with smaller phrase technology. Any transcriptional errors that result from this process are unintentional.

## 2019-07-21 NOTE — Consult Note (Signed)
[place] for Heparin Indication: History of aortic aneurysm surgery and thrombus, holding Eliquis for procedure  Allergies  Allergen Reactions  . Codeine Nausea Only    Patient questioned this (??)  . Sulfa Antibiotics Hives and Nausea And Vomiting    Patient Measurements: Height: 5\' 1"  (154.9 cm) Weight: 107 lb 5.8 oz (48.7 kg) IBW/kg (Calculated) : 52.3 Heparin Dosing Weight: 45.4kg  Vital Signs: Temp: 98.7 F (37.1 C) (09/29 0448) Temp Source: Oral (09/29 0448) BP: 121/81 (09/29 0448) Pulse Rate: 99 (09/29 0448)  Labs: Recent Labs    07/20/19 1029 07/20/19 1156 07/21/19 0150  HGB 9.8*  --   --   HCT 31.2*  --   --   PLT 234  --   --   APTT 27  --  47*  LABPROT 15.4*  --   --   INR 1.2  --   --   HEPARINUNFRC <0.10*  --   --   CREATININE 4.39*  --   --   TROPONINIHS 67* 59*  --     Estimated Creatinine Clearance: 14.6 mL/min (A) (by C-G formula based on SCr of 4.39 mg/dL (H)).   Medical History: Past Medical History:  Diagnosis Date  . ESRD (end stage renal disease) ([place])   . Hypertension   . PAD (peripheral artery disease) ([facility])    Required aortofemoral stent-which had closed and had to redo the procedure and ischemia of limb.  . Peripheral vascular disease ([place])   . Renal disorder   . Secondary hyperparathyroidism of renal origin ([place])     Medications:  Pt on Eliquis 2.5mg  bid prior to admission -  Last dose 9/27 @ 2000  Assessment: Pt with possible expected procedure (possible thoracentesis) - will transition to heparin drip from Eliquis per pharmacy protocol.   Heparin Course: 9/28 initiation: 1500 unit bolus, then 600 units/hr 9/29 0150 aPTT 47 1000 unit bolus, inc to 800 units/hr 9/29 0850-1430 heparin stopped for thoracentesis  Goal of Therapy:  Heparin level 0.3-0.7 units/ml aPTT goal 66-102s Monitor platelets by anticoagulation protocol: Yes   Plan:   Heparin was held 9/29 from 0850 to 1430  for thoracentesis  Check aPTT at 2300, which is approximately 8 hours after restarting. aPTT will be used to guide heparin infusion rate until aPTT and HL correlate  HL in am to check for correlation  CBC in am  Dallie Piles, PharmD Clinical Pharmacist 07/21/2019 7:28 AM

## 2019-07-21 NOTE — Progress Notes (Signed)
Pts heparin stopped per verbal order from Dr. Brett Albino for thoracentesis.

## 2019-07-21 NOTE — Progress Notes (Signed)
CT chest reviewed This is most likely related to ESRD and transudate effusions with atelectasis  Recommend US guided thoracentesis Follow up nephrology recs Stop all ABX  Epic message sent to Dr Reva Bores

## 2019-07-21 NOTE — Consult Note (Signed)
[place] for Heparin Indication: History of aortic aneurysm surgery and thrombus, holding Eliquis for procedure  Allergies  Allergen Reactions  . Codeine Nausea Only    Patient questioned this (??)  . Sulfa Antibiotics Hives and Nausea And Vomiting    Patient Measurements: Height: 5\' 1"  (154.9 cm) Weight: 103 lb 2.8 oz (46.8 kg) IBW/kg (Calculated) : 52.3 Heparin Dosing Weight: 45.4kg  Vital Signs: Temp: 97.9 F (36.6 C) (09/29 2119) Temp Source: Oral (09/29 2119) BP: 125/92 (09/29 2119) Pulse Rate: 89 (09/29 2119)  Labs: Recent Labs    07/20/19 1029 07/20/19 1156 07/21/19 0150 07/21/19 1453 07/21/19 2248  HGB 9.8*  --   --  8.2*  --   HCT 31.2*  --   --  26.1*  --   PLT 234  --   --  153  --   APTT 27  --  47*  --  59*  LABPROT 15.4*  --   --   --   --   INR 1.2  --   --   --   --   HEPARINUNFRC <0.10*  --   --   --  0.15*  CREATININE 4.39*  --   --  4.14*  --   TROPONINIHS 67* 59*  --   --   --     Estimated Creatinine Clearance: 14.9 mL/min (A) (by C-G formula based on SCr of 4.14 mg/dL (H)).   Medical History: Past Medical History:  Diagnosis Date  . ESRD (end stage renal disease) ([name])   . Hypertension   . PAD (peripheral artery disease) ([facility])    Required aortofemoral stent-which had closed and had to redo the procedure and ischemia of limb.  . Peripheral vascular disease ([place])   . Renal disorder   . Secondary hyperparathyroidism of renal origin ([place])     Medications:  Pt on Eliquis 2.5mg  bid prior to admission -  Last dose 9/27 @ 2000  Assessment: Pt with possible expected procedure (possible thoracentesis) - will transition to heparin drip from Eliquis per pharmacy protocol.   Heparin Course: 9/28 initiation: 1500 unit bolus, then 600 units/hr 9/29 0150 aPTT 47 1000 unit bolus, inc to 800 units/hr 9/29 0850-1430 heparin stopped for thoracentesis 9/29 2248 aPTT 59, HL 0.15, will increase heparin to 950  units/hr  Goal of Therapy:  Heparin level 0.3-0.7 units/ml aPTT goal 66-102s Monitor platelets by anticoagulation protocol: Yes   Plan:   Increase Heparin to 950 units/hr  Check aPTT approximately 8 hours after rate increase.  aPTT will be used to guide heparin infusion rate until aPTT and HL correlate  HL in am w/ aPTT to check for correlation  CBC in am  Ena Dawley, PharmD Clinical Pharmacist 07/21/2019 11:51 PM

## 2019-07-21 NOTE — Progress Notes (Signed)
Established hemodialysis patient known at [facility] TTS 11:00. Patient normally rides with [facility]. Please contact me directly with any dialysis placement concerns.   [name] Dialysis Coordinator [phone]

## 2019-07-22 LAB — PROTEIN, BODY FLUID (OTHER): Total Protein, Body Fluid Other: 1.9 g/dL

## 2019-07-22 LAB — PROCALCITONIN: Procalcitonin: 0.25 ng/mL

## 2019-07-22 MED ORDER — ONDANSETRON HCL 4 MG/2ML IJ SOLN
4.0000 mg | Freq: Four times a day (QID) | INTRAMUSCULAR | Status: DC | PRN
  Administered 2019-07-22 – 2019-07-23 (×2): 4 mg via INTRAVENOUS
  Filled 2019-07-22 (×2): qty 2

## 2019-07-22 MED ORDER — GUAIFENESIN-DM 100-10 MG/5ML PO SYRP: 5.0000 mL | ORAL_SOLUTION | ORAL | Status: DC | PRN

## 2019-07-22 MED ORDER — APIXABAN 2.5 MG PO TABS
2.5000 mg | ORAL_TABLET | Freq: Two times a day (BID) | ORAL | Status: DC
  Administered 2019-07-22 – 2019-07-23 (×3): 2.5 mg via ORAL
  Filled 2019-07-22 (×3): qty 1

## 2019-07-22 NOTE — Progress Notes (Signed)
[facility]  ROUNDING NOTE   Subjective:   Hemodialysis treatment yesterday. UF of 2 liters.   Left thoracentesis yesterday, 1.5 liters removed. Transudative.   Objective:  Vital signs in last 24 hours:  Temp:  [97.8 F (36.6 C)-98.4 F (36.9 C)] 98.2 F (36.8 C) (09/30 0432) Pulse Rate:  [85-97] 92 (09/30 0432) Resp:  [15-30] 17 (09/30 0432) BP: (85-138)/(64-99) 122/87 (09/30 0432) SpO2:  [93 %-100 %] 100 % (09/30 0432) Weight:  [46.8 kg-48.7 kg] 46.8 kg (09/29 1810)  Weight change: 0 kg Filed Weights   07/20/19 2024 07/21/19 1437 07/21/19 1810  Weight: 48.7 kg 48.7 kg 46.8 kg    Intake/Output: I/O last 3 completed shifts: In: 439.4 [P.O.:240; I.V.:99.4; IV Piggyback:100] Out: 2102 [Urine:100; Other:2002]   Intake/Output this shift:  Total I/O In: 360 [P.O.:360] Out: 200 [Urine:200]  Physical Exam: General: NAD,   Head: Normocephalic, atraumatic. Moist oral mucosal membranes  Eyes: Anicteric, PERRL  Neck: Supple, trachea midline  Lungs:  Right diminished.   Heart: Regular rate and rhythm  Abdomen:  Soft, nontender,   Extremities:  no peripheral edema.  Neurologic: Nonfocal, moving all four extremities  Skin: No lesions  Access: RIJ permcath    Basic Metabolic Panel: Recent Labs  Lab 07/20/19 1029 07/21/19 1453  NA 138 139  K 3.6 3.9  CL 102 105  CO2 24 27  GLUCOSE 111* 96  BUN 30* 33*  CREATININE 4.39* 4.14*  CALCIUM 9.4 8.6*    Liver Function Tests: Recent Labs  Lab 07/20/19 1029  AST 26  ALT 18  ALKPHOS 81  BILITOT 1.8*  PROT 7.2  ALBUMIN 3.1*   Recent Labs  Lab 07/20/19 1029  LIPASE 72*   No results for input(s): AMMONIA in the last 168 hours.  CBC: Recent Labs  Lab 07/20/19 1029 07/21/19 1453  WBC 11.0* 3.9*  NEUTROABS 9.3*  --   HGB 9.8* 8.2*  HCT 31.2* 26.1*  MCV 102.6* 103.2*  PLT 234 153    Cardiac Enzymes: No results for input(s): CKTOTAL, CKMB, CKMBINDEX, TROPONINI in the last 168  hours.  BNP: Invalid input(s): POCBNP  CBG: No results for input(s): GLUCAP in the last 168 hours.  Microbiology: Results for orders placed or performed during the hospital encounter of 07/20/19  SARS Coronavirus 2 [facility] order, Performed in [facility] hospital lab) Nasopharyngeal Nasopharyngeal Swab     Status: None   Collection Time: 07/20/19 10:29 AM   Specimen: Nasopharyngeal Swab  Result Value Ref Range Status   SARS Coronavirus 2 NEGATIVE NEGATIVE Final    Comment: (NOTE) If result is NEGATIVE SARS-CoV-2 target nucleic acids are NOT DETECTED. The SARS-CoV-2 RNA is generally detectable in upper and lower  respiratory specimens during the acute phase of infection. The lowest  concentration of SARS-CoV-2 viral copies this assay can detect is 250  copies / mL. A negative result does not preclude SARS-CoV-2 infection  and should not be used as the sole basis for treatment or other  patient management decisions.  A negative result may occur with  improper specimen collection / handling, submission of specimen other  than nasopharyngeal swab, presence of viral mutation(s) within the  areas targeted by this assay, and inadequate number of viral copies  (<250 copies / mL). A negative result must be combined with clinical  observations, patient history, and epidemiological information. If result is POSITIVE SARS-CoV-2 target nucleic acids are DETECTED. The SARS-CoV-2 RNA is generally detectable in upper and lower  respiratory specimens  dur ing the acute phase of infection.  Positive  results are indicative of active infection with SARS-CoV-2.  Clinical  correlation with patient history and other diagnostic information is  necessary to determine patient infection status.  Positive results do  not rule out bacterial infection or co-infection with other viruses. If result is PRESUMPTIVE POSTIVE SARS-CoV-2 nucleic acids MAY BE PRESENT.   A presumptive positive result was obtained  on the submitted specimen  and confirmed on repeat testing.  While 2019 novel coronavirus  (SARS-CoV-2) nucleic acids may be present in the submitted sample  additional confirmatory testing may be necessary for epidemiological  and / or clinical management purposes  to differentiate between  SARS-CoV-2 and other Sarbecovirus currently known to infect humans.  If clinically indicated additional testing with an alternate test  methodology [phone]) is advised. The SARS-CoV-2 RNA is generally  detectable in upper and lower respiratory sp ecimens during the acute  phase of infection. The expected result is Negative. Fact Sheet for Patients:  StrictlyIdeas.no Fact Sheet for Healthcare Providers: BankingDealers.co.za This test is not yet approved or cleared by the [place] FDA and has been authorized for detection and/or diagnosis of SARS-CoV-2 by FDA under an Emergency Use Authorization (EUA).  This EUA will remain in effect (meaning this test can be used) for the duration of the COVID-19 declaration under Section 564(b)(1) of the Act, 21 U.S.C. section 360bbb-3(b)(1), unless the authorization is terminated or revoked sooner. Performed at [facility], [address] [place], [place] [address]   Body fluid culture     Status: None (Preliminary result)   Collection Time: 07/21/19  1:31 PM   Specimen: PATH Cytology Pleural fluid  Result Value Ref Range Status   Specimen Description   Final    PLEURAL Performed at [facility], [address], [place], [place] [address]    Special Requests   Final    NONE Performed at [facility], [address] [place], [place] [address]    Gram Stain   Final    FEW WBC PRESENT,BOTH PMN AND MONONUCLEAR NO ORGANISMS SEEN    Culture   Final    NO GROWTH < 12 HOURS Performed at [facility], [place] [address], [place], [place] [address]    Report Status PENDING   Incomplete    Coagulation Studies: Recent Labs    07/20/19 1029  LABPROT 15.4*  INR 1.2    Urinalysis: No results for input(s): COLORURINE, LABSPEC, PHURINE, GLUCOSEU, HGBUR, BILIRUBINUR, KETONESUR, PROTEINUR, UROBILINOGEN, NITRITE, LEUKOCYTESUR in the last 72 hours.  Invalid input(s): APPERANCEUR    Imaging: US Renal  Result Date: 07/20/2019 CLINICAL DATA:  Attempt to evaluate mass seen on prior CT within the right kidney. EXAM: RENAL / URINARY TRACT ULTRASOUND COMPLETE COMPARISON:  CT a CAP July 20, 2019 FINDINGS: Right Kidney: Renal measurements: 6.6 x 2.9 x 3.9 cm = volume: 39 mL. Kidney is atrophic. Within the superior pole of the right kidney there is a 2.1 x 1.8 x 1.5 Cm heterogeneous masslike structure. No hydronephrosis. Left Kidney: Renal measurements: 6.1 x 2.9 x 2.4 cm = volume: 23 mL. Kidney is atrophic. No hydronephrosis. Bladder: Appears normal for degree of bladder distention. Bilateral pleural effusions.  Small volume ascites. IMPRESSION: Focal masslike area involving the superior pole of the right kidney. This is incompletely characterized on current examination. This may represent a true renal mass or potentially non atrophic renal parenchyma. In the nonacute setting, recommend further evaluation with  pre and post contrast-enhanced CT or MRI for definitive characterization. Electronically Signed   By: Lovey Newcomer M.D.   On: 07/20/2019 18:43   Dg Chest Port 1 View  Result Date: 07/21/2019 CLINICAL DATA:  Status post left thoracentesis. EXAM: PORTABLE CHEST 1 VIEW COMPARISON:  Chest x-ray 07/20/2019. FINDINGS: Dialysis catheter noted stable position. Cardiomegaly with mild bilateral interstitial prominence again noted. Findings suggest CHF. Pneumonitis cannot be excluded. Previous identified large left pleural effusion has been removed by thoracentesis. No pneumothorax post thoracentesis. Left upper extremity vascular stent graft noted. IMPRESSION: 1. Left thoracentesis  with significant clearing of pleural effusions. No evidence of pneumothorax post thoracentesis. 2.  Dialysis catheter stable position. 3. Cardiomegaly with bilateral interstitial prominence again noted. Findings suggest CHF. Electronically Signed   By: Marcello Moores  Register   On: 07/21/2019 14:20   Dg Chest Portable 1 View  Result Date: 07/20/2019 CLINICAL DATA:  Chest pain. EXAM: PORTABLE CHEST 1 VIEW COMPARISON:  07/09/2019 FINDINGS: There is a moderate to large left-sided pleural effusion with adjacent compressive atelectasis. There is persistent cardiomegaly. There are scattered airspace opacities throughout the right lung zone which have improved from prior study. There is no pneumothorax. There is a trace right-sided pleural effusion. There is a well-positioned tunneled right-sided dialysis catheter. A vascular stent projects over the patient's axilla. IMPRESSION: 1. Moderate to large left-sided pleural effusion, increased in size from prior study. 2. Improving airspace opacity throughout the right lung field. 3. Well-positioned tunneled dialysis catheter. Electronically Signed   By: Constance Holster M.D.   On: 07/20/2019 10:34   Ct Angio Chest/abd/pel For Dissection W And/or Wo Contrast  Result Date: 07/20/2019 CLINICAL DATA:  Chest pain. Prior triple area EXAM: CT ANGIOGRAPHY CHEST, ABDOMEN AND PELVIS TECHNIQUE: Multidetector CT imaging through the chest, abdomen and pelvis was performed using the standard protocol during bolus administration of intravenous contrast. Multiplanar reconstructed images and MIPs were obtained and reviewed to evaluate the vascular anatomy. CONTRAST:  24mL OMNIPAQUE IOHEXOL 350 MG/ML SOLN COMPARISON:  May 19, 2019 FINDINGS: CTA CHEST FINDINGS Cardiovascular: There is no large centrally located pulmonary embolism. The heart size is mildly enlarged. There is no aortic dissection. There is a tunneled dialysis catheter with appropriate positioning. The intracardiac blood pool is  hypodense relative to the adjacent myocardium consistent with anemia. There is a small pericardial effusion. Mediastinum/Nodes: --there are enlarged mediastinal and hilar lymph nodes. --there are no pathologically enlarged axillary lymph nodes. --No supraclavicular lymphadenopathy. --Normal thyroid gland. --The esophagus is unremarkable Lungs/Pleura: There is a moderate right and large left pleural effusion. There is partial collapse of the left lower lobe. There are scattered airspace opacities in the right lower lobe. There is interlobular septal thickening bilaterally. There is no pneumothorax. There is debris within the upper trachea. Musculoskeletal: No chest wall abnormality. No acute or significant osseous findings. Review of the MIP images confirms the above findings. CTA ABDOMEN AND PELVIS FINDINGS VASCULAR Aorta: The thoracic aorta is patent without evidence for a dissection. There is an aortobifemoral bypass graft. There is some narrowing of the anterior stent. This degree of narrowing is similar to prior study. The limbs remain grossly patent. The native aorta is occluded. Celiac: The celiac axis is patent. The GDA is patent. SMA: There is a stent involving the proximal SMA. The stent is occluded. There is reconstitution distal to the stent. Renals: The native renal arteries are atretic. There is likely high-grade stenosis at the origin of the left renal artery. IMA: The proximal IMA  is stenotic. Inflow: The iliac limbs of the bypass graft are widely patent. There is reconstitution of the left external iliac artery and left internal iliac arteries via retrograde flow. The visualized portions of the SFA bilaterally are patent. Veins: No obvious venous abnormality within the limitations of this arterial phase study. Review of the MIP images confirms the above findings. NON-VASCULAR Hepatobiliary: The liver is normal. Normal gallbladder.There is no biliary ductal dilation. Pancreas: Normal contours without  ductal dilatation. No peripancreatic fluid collection. Spleen: There is a low-attenuation area within the spleen measuring approximately 2.1 cm. Adrenals/Urinary Tract: --Adrenal glands: No adrenal hemorrhage. --Right kidney/ureter: The native kidney is atrophic. There is a hyperdense area arising from the upper pole measuring approximately 1.5 cm. --Left kidney/ureter: The left kidney is atrophic. --Urinary bladder: Unremarkable. Stomach/Bowel: --Stomach/Duodenum: No hiatal hernia or other gastric abnormality. Normal duodenal course and caliber. --Small bowel: No dilatation or inflammation. --Colon: No focal abnormality. --Appendix: Normal. Lymphatic: Normal course and caliber of the major abdominal vessels. --No retroperitoneal lymphadenopathy. --No mesenteric lymphadenopathy. --No pelvic or inguinal lymphadenopathy. Reproductive: Unremarkable Other: There is a small volume of free fluid in the abdomen. There is no free air. The abdominal wall is normal. Musculoskeletal. 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 IMPRESSION: 1. No acute thoracic or abdominal aortic dissection. 2. Bilateral pleural effusions, left greater than right, with partial collapse of the left lower lobe. 3. Cardiomegaly with generalized volume overload. 4. Airspace opacities in the right lower lobe concerning for pneumonia or aspiration. 5. Dialysis catheter with appropriate positioning. 6. Mediastinal and hilar adenopathy, likely reactive. 7. Patent aortobifemoral bypass graft. 8. There is a 1.5 cm hyperdense area arising from the upper pole of the right kidney. While this could represent a hemorrhagic/proteinaceous cyst, a solid mass is not excluded. Recommend further evaluation with ultrasound on a nonemergent basis. 9. There is a 2.1 cm low-attenuation area within the spleen. This could represent a splenic infarct. 10. Complete occlusion of the SMA stent. Per report, this was a finding  present on the patient's outside CT dated 05/29/2019 from [facility] and therefore is chronic. 11. Small volume of free fluid in the abdomen pelvis. Aortic Atherosclerosis (ICD10-I70.0). Electronically Signed   By: Constance Holster M.D.   On: 07/20/2019 14:54   US Thoracentesis Asp Pleural Space W/img Guide  Result Date: 07/21/2019 INDICATION: Pleural effusion EXAM: ULTRASOUND GUIDED LEFT THORACENTESIS MEDICATIONS: None. COMPLICATIONS: None. PROCEDURE: An ultrasound guided thoracentesis was thoroughly discussed with the patient and questions answered. The benefits, risks, alternatives and complications were also discussed. The patient understands and wishes to proceed with the procedure. Written consent was obtained. Ultrasound was performed to localize and mark an adequate pocket of fluid in the left chest. The area was then prepped and draped in the normal sterile fashion. 1% Lidocaine was used for local anesthesia. Under ultrasound guidance a 6 French catheter was introduced. Thoracentesis was performed. The catheter was removed and a dressing applied. FINDINGS: A total of approximately 1.5 L of amber fluid was removed. Samples were sent to the laboratory as requested by the clinical team. IMPRESSION: Successful ultrasound guided left thoracentesis yielding 1.5 L of pleural fluid. Electronically Signed   By: Marcello Moores  Register   On: 07/21/2019 14:07     Medications:    . amLODipine  10 mg Oral Q2000  . apixaban  2.5 mg Oral BID  . aspirin EC  81 mg Oral Daily  . atorvastatin  80 mg Oral QHS  . carvedilol  25 mg Oral BID WC  . Chlorhexidine Gluconate Cloth  6 each Topical Daily  . feeding supplement (NEPRO CARB STEADY)  237 mL Oral BID BM  . gabapentin  100 mg Oral TID  . hydrALAZINE  25 mg Oral Q8H  . loratadine  10 mg Oral Daily  . multivitamin  1 tablet Oral QHS  . sevelamer carbonate  1,600 mg Oral TID WC   acetaminophen, docusate sodium, hydrOXYzine,  oxyCODONE-acetaminophen  Assessment/ Plan:  [name] is a 43 y.o. white malewith end stage renal disease on hemodialysis, hypertension, peripheral vascular disease, depression who is admitted to [facility] on 07/09/2019 for pneumonia  [place]. TTS 46.5 kg Left AVG  1. End Stage Renal Disease: - Continue TTS schedule.   2. Pleural effusions: left thoracentesis yesterday. transudative labs.  - weaning oxygen  3. Anemia of chronic kidney disease: hemoglobin 8.2. Macrocytic.  - EPO with HD treatment  4. Hypertension:   - amlodipine, carvedilol and hydralazine.  5. Secondary Hyperparathyroidism - sevelamer   LOS: 2 [place] 9/30/202010:18 AM

## 2019-07-22 NOTE — Progress Notes (Addendum)
[facility] [place] NAME: Burchard Gatti    MR#:  [id number]  DATE OF BIRTH:  January 09, 1976  SUBJECTIVE:   Patient states he is feeling a little bit better today.  He feels like he can breathe much better than yesterday.  He endorses a dry cough.  No chest pain.  REVIEW OF SYSTEMS:  Review of Systems  Constitutional: Negative for chills and fever.  HENT: Negative for congestion and sore throat.   Eyes: Negative for blurred vision and double vision.  Respiratory: Positive for shortness of breath. Negative for cough.   Cardiovascular: Negative for chest pain and palpitations.  Gastrointestinal: Negative for nausea and vomiting.  Musculoskeletal: Negative for back pain and neck pain.  Neurological: Negative for dizziness and headaches.  Psychiatric/Behavioral: Negative for depression. The patient is not nervous/anxious.     DRUG ALLERGIES:   Allergies  Allergen Reactions  . Codeine Nausea Only    Patient questioned this (??)  . Sulfa Antibiotics Hives and Nausea And Vomiting   VITALS:  Blood pressure 107/80, pulse 91, temperature 98.1 F (36.7 C), temperature source Oral, resp. rate 17, height 5\' 1"  (1.549 m), weight 46.8 kg, SpO2 100 %. PHYSICAL EXAMINATION:  Physical Exam  GENERAL:  Laying in the bed with no acute distress.  Chronically ill-appearing. HEENT: Head atraumatic, normocephalic. Pupils equal, round, reactive to light and accommodation. No scleral icterus. Extraocular muscles intact. Oropharynx and nasopharynx clear.  NECK:  Supple, no jugular venous distention. No thyroid enlargement. LUNGS: + Improved breath sounds throughout all lung fields, no crackles or wheezing. No use of accessory muscles of respiration.  Nasal cannula in place. CARDIOVASCULAR: RRR, S1, S2 normal. No murmurs, rubs, or gallops.  ABDOMEN: Soft, nontender, nondistended. Bowel sounds present.  EXTREMITIES: No pedal edema, cyanosis, or clubbing.   NEUROLOGIC: CN 2-12 intact, no focal deficits. 5/5 muscle strength throughout all extremities. Sensation intact throughout. Gait not checked.  PSYCHIATRIC: The patient is alert and oriented x 3.  SKIN: No obvious rash, lesion, or ulcer.  LABORATORY PANEL:  Male CBC Recent Labs  Lab 07/21/19 1453  WBC 3.9*  HGB 8.2*  HCT 26.1*  PLT 153   ------------------------------------------------------------------------------------------------------------------ Chemistries  Recent Labs  Lab 07/20/19 1029 07/21/19 1453  NA 138 139  K 3.6 3.9  CL 102 105  CO2 24 27  GLUCOSE 111* 96  BUN 30* 33*  CREATININE 4.39* 4.14*  CALCIUM 9.4 8.6*  AST 26  --   ALT 18  --   ALKPHOS 81  --   BILITOT 1.8*  --    RADIOLOGY:  Dg Chest Port 1 View  Result Date: 07/21/2019 CLINICAL DATA:  Status post left thoracentesis. EXAM: PORTABLE CHEST 1 VIEW COMPARISON:  Chest x-ray 07/20/2019. FINDINGS: Dialysis catheter noted stable position. Cardiomegaly with mild bilateral interstitial prominence again noted. Findings suggest CHF. Pneumonitis cannot be excluded. Previous identified large left pleural effusion has been removed by thoracentesis. No pneumothorax post thoracentesis. Left upper extremity vascular stent graft noted. IMPRESSION: 1. Left thoracentesis with significant clearing of pleural effusions. No evidence of pneumothorax post thoracentesis. 2.  Dialysis catheter stable position. 3. Cardiomegaly with bilateral interstitial prominence again noted. Findings suggest CHF. Electronically Signed   By: Marcello Moores  Register   On: 07/21/2019 14:20   ASSESSMENT AND PLAN:   Acute hypoxic respiratory failure secondary to bilateral pleural effusion- likely due to CHF and ESRD.  No cough, fevers, or leukocytosis to suggest pneumonia. -s/p L thoracentesis 1.5  L fluid removed- labs consistent with transudate -Wean O2 to room air  Right renal mass- incidental finding. -CTA chest/abdomen/pelvis with 1.5 cm hyperdense  area in the upper pole of the right kidney -Renal ultrasound with focal masslike area of the superior pole of the right kidney -Discussed with urology- Dr. Bernardo Heater will see him as an outpatient for further evaluation.  ESRD on HD TTS -Nephrology following -Plan for HD tomorrow  Hypertension- BP well controlled -Continue carvedilol, amlodipine, hydralazine  Peripheral arterial disease- multiple procedures, aortic aneurysm and aortofemoral bypass with closure later on causing limb ischemia requiring stenting. -Restart Eliquis today  Tobacco use -Tobacco cessation counseling performed this admission  All the records are reviewed and case discussed with Care Management/Social Worker. Management plans discussed with the patient, family and they are in agreement.  CODE STATUS: DNR  TOTAL TIME TAKING CARE OF THIS PATIENT: 40 minutes.   More than 50% of the time was spent in counseling/coordination of care: YES  POSSIBLE D/C IN 1-2 DAYS, DEPENDING ON CLINICAL CONDITION.   Berna Spare Mayo M.D on 07/22/2019 at 2:07 PM  Between 7am to 6pm - Pager [phone]  After 6pm go to [email or web address] - Proofreader  [employer] [facility]  Office  [phone]  CC: Primary care physician; Patient, [name]  Note: This dictation was prepared with Dragon dictation along with smaller phrase technology. Any transcriptional errors that result from this process are unintentional.

## 2019-07-22 NOTE — TOC Initial Note (Signed)
Transition of Care [facility]) - Initial/Assessment Note    Patient Details  Name: Brett Small MRN: [id number] Date of Birth: Jul 22, 1976  Transition of Care [facility]) CM/SW Contact:    Annamaria Boots, [place] Phone Number: 07/22/2019, 3:25 PM  Clinical Narrative:   Patient at  Extreme risk for readmission. Patient was just here at [facility] last week. Patient lives in a boarding home and has Medicare and Medicaid. Patient reports that he has home health PT coming to the home through [facility]. Patient reports that he uses [place] for transportation. Patient goes to dialysis Tues, Thurs, Saturday at [facility]. Patient was set up with new PCP appointment last hospitalization but he did not go because he did not have transportation. Patient will be set up with new PCP appointment again prior to discharge. Patient reports that he gets medications through [facility]. Patient states that he has no other needs at this time.                 Expected Discharge Plan: [place] Barriers to Discharge: Continued Medical Work up   Patient Goals and CMS Choice   CMS Medicare.gov Compare Post Acute Care list provided to:: Patient Choice offered to / list presented to : Patient  Expected Discharge Plan and Services Expected Discharge Plan: [place]       Living arrangements for the past 2 months: [place] Expected Discharge Date: 07/22/19                                    Prior Living Arrangements/Services Living arrangements for the past 2 months: [place] with:: Self Patient language and need for interpreter reviewed:: Yes Do you feel safe going back to the place where you live?: Yes      Need for Family Participation in Patient Care: No (Comment) Care giver support system in place?: No (comment) Current home services: Home PT Criminal Activity/Legal Involvement Pertinent to Current Situation/Hospitalization: No - Comment as  needed  Activities of Daily Living Home Assistive Devices/Equipment: Dentures (specify type), Eyeglasses, Walker (specify type), Cane (specify quad or straight) ADL Screening (condition at time of admission) Patient's cognitive ability adequate to safely complete daily activities?: Yes Is the patient deaf or have difficulty hearing?: No Does the patient have difficulty seeing, even when wearing glasses/contacts?: No Does the patient have difficulty concentrating, remembering, or making decisions?: No Patient able to express need for assistance with ADLs?: No Does the patient have difficulty dressing or bathing?: No Independently performs ADLs?: Yes (appropriate for developmental age) Communication: Independent Dressing (OT): Independent Grooming: Independent Feeding: Independent Bathing: Independent Toileting: Independent In/Out Bed: Independent Walks in Home: Independent Does the patient have difficulty walking or climbing stairs?: Yes Weakness of Legs: Both Weakness of Arms/Hands: Both  Permission Sought/Granted Permission sought to share information with : Case Manager, Family Supports Permission granted to share information with : Yes, Verbal Permission Granted              Emotional Assessment Appearance:: Appears stated age   Affect (typically observed): Accepting, Flat Orientation: : Oriented to Self, Oriented to Place, Oriented to  Time, Oriented to Situation Alcohol / Substance Use: Not Applicable Psych Involvement: No (comment)  Admission diagnosis:  Pleural effusion [J90] Renal mass [N28.89] Acute respiratory failure with hypoxia ([place]) [J96.01] Patient Active Problem List   Diagnosis Date Noted  . Pleural  effusion 07/20/2019  . NSTEMI (non-ST elevated myocardial infarction) ([place]) 07/09/2019  . Pneumonia 07/02/2019  . Acute liver failure 05/19/2019  . Hematemesis 05/19/2019  . Hepatitis   . Thrombocytopenia ([place])   . Coagulopathy ([place])   . Sepsis ([place])  02/28/2019  . Lobar pneumonia ([place]) 02/28/2019  . Acute respiratory failure ([place]) 02/04/2019  . Acute respiratory failure with hypoxemia ([place]) 01/27/2019  . Depression 01/07/2019  . MDD (major depressive disorder), single episode, severe , no psychosis ([place])   . Homelessness   . Acute respiratory failure with hypoxia ([place]) 12/25/2018  . End stage renal disease on dialysis ([place]) 12/25/2018  . Hypertension 12/25/2018  . Renal osteodystrophy 12/25/2018   PCP:  Patient, [name] Pharmacy:   [place] [place] [address] [place] [address] Phone: [phone] Fax: [phone]  [place], [place] [address] Phone: [phone] Fax: [phone]  [place], [place] [address] Phone: [phone] Fax: [phone]     Social Determinants of Health (SDOH) Interventions    Readmission Risk Interventions Readmission Risk Prevention Plan 07/22/2019 07/03/2019 05/21/2019  Transportation Screening Complete Complete Complete  Medication Review (RN Care Manager) Referral to Pharmacy Complete Complete  PCP or Specialist appointment within 3-5 days of discharge Complete (No Data) -  PCP/Specialist Appt Not Complete comments - - -  HRI or Home Care Consult Complete Complete -  SW Recovery Care/Counseling Consult Complete - -  Palliative Care Screening Not Applicable - Not [place] Not Applicable Not Applicable Not Applicable

## 2019-07-23 LAB — BASIC METABOLIC PANEL
Anion gap: 12 (ref 5–15)
BUN: 39 mg/dL — ABNORMAL HIGH (ref 6–20)
CO2: 27 mmol/L (ref 22–32)
Calcium: 8.5 mg/dL — ABNORMAL LOW (ref 8.9–10.3)
Chloride: 102 mmol/L (ref 98–111)
Creatinine, Ser: 4.48 mg/dL — ABNORMAL HIGH (ref 0.61–1.24)
GFR calc Af Amer: 17 mL/min — ABNORMAL LOW (ref >60)
GFR calc non Af Amer: 15 mL/min — ABNORMAL LOW (ref >60)
Glucose, Bld: 83 mg/dL (ref 70–99)
Potassium: 4.6 mmol/L (ref 3.5–5.1)
Sodium: 141 mmol/L (ref 135–145)

## 2019-07-23 LAB — CBC
HCT: 26.9 % — ABNORMAL LOW (ref 39.0–52.0)
Hemoglobin: 8.5 g/dL — ABNORMAL LOW (ref 13.0–17.0)
MCH: 32 pg (ref 26.0–34.0)
MCHC: 31.6 g/dL (ref 30.0–36.0)
MCV: 101.1 fL — ABNORMAL HIGH (ref 80.0–100.0)
Platelets: 160 10*3/uL (ref 150–400)
RBC: 2.66 MIL/uL — ABNORMAL LOW (ref 4.22–5.81)
RDW: 17.7 % — ABNORMAL HIGH (ref 11.5–15.5)
WBC: 7.9 10*3/uL (ref 4.0–10.5)
nRBC: 0 % (ref 0.0–0.2)

## 2019-07-23 MED ORDER — EPOETIN ALFA 10000 UNIT/ML IJ SOLN
10000.0000 [IU] | INTRAMUSCULAR | Status: DC
  Administered 2019-07-23: 10000 [IU] via INTRAVENOUS

## 2019-07-23 NOTE — Care Management Important Message (Signed)
Important Message  Patient Details  Name: Alani Carrejo MRN: [id number] Date of Birth: 1976/06/29   Medicare Important Message Given:  Yes     Dannette Barbara 07/23/2019, 1:45 PM

## 2019-07-23 NOTE — Discharge Summary (Signed)
[place] [facility] [place] NAME: Brett Small    MR#:  [id number]  DATE OF BIRTH:  08-10-1976  DATE OF ADMISSION:  07/20/2019 ADMITTING PHYSICIAN: Vaughan Basta, MD  DATE OF DISCHARGE: 07/23/2019  PRIMARY CARE PHYSICIAN: Patient, [name]   ADMISSION DIAGNOSIS:  Pleural effusion [J90] Renal mass [N28.89] Acute respiratory failure with hypoxia (HCC) [J96.01]  DISCHARGE DIAGNOSIS:  Principal Problem:   Pleural effusion Active Problems:   Pneumonia Pleural effusion ESRD on dialysis Hypertension Peripheral arterial disease Tobacco abuse  SECONDARY DIAGNOSIS:   Past Medical History:  Diagnosis Date  . ESRD (end stage renal disease) ([place])   . Hypertension   . PAD (peripheral artery disease) (HCC)    Required aortofemoral stent-which had closed and had to redo the procedure and ischemia of limb.  . Peripheral vascular disease ([place])   . Renal disorder   . Secondary hyperparathyroidism of renal origin [facility])      ADMITTING HISTORY Brett Small  is a 43 y.o. male with a known history of ESRD, hypertension, chronic kidney disease, secondary hyperparathyroidism of renal origin, AV shunt and fistula, aortofemoral stent which closed with cardiologist airplane and had to redo the procedure and patient is on anticoagulation for that.  He was admitted for pneumonia twice in the last 1 month.  These episodes are also associated with some pleural effusion and pericardial effusion along with fluid overload.  During last admission he also had cardiac catheterization done.He was doing fine after being discharged for 2 to 3 days but then started having shortness of breath again so came to emergency room.  He also have chest tightness or pain which is not associated with any activities. ER physician did CT scan of the chest which reported pleural effusion and some atelectasis of the lung or questionable pneumonia.  Patient's call with test is  negative.  HOSPITAL COURSE:  Patient was worked up with CT scan of the chest which showed pleural effusion and atelectasis of the lung.  COVID-19 test is negative.  Patient was initially started on Unasyn antibiotic for questionable pneumonia.  Nephrology evaluated the patient he continued dialysis.  Blood pressure was controlled with multiple agents.  Patient underwent thoracentesis and greater than 1.5 L fluid was removed which was transudative.  Tobacco cessation has been counseled to the patient.  Pleural fluid culture did not reveal any growth.  MRSA PCR was negative culture of the blood did not reveal any growth.  Patient shortness of breath improved he was weaned off oxygen.  CONSULTS OBTAINED:  Nephrology consult  DRUG ALLERGIES:   Allergies  Allergen Reactions  . Codeine Nausea Only    Patient questioned this (??)  . Sulfa Antibiotics Hives and Nausea And Vomiting    DISCHARGE MEDICATIONS:   Allergies as of 07/23/2019      Reactions   Codeine Nausea Only   Patient questioned this (??)   Sulfa Antibiotics Hives, Nausea And Vomiting      Medication List    STOP taking these medications   levofloxacin 500 MG tablet Commonly known as: LEVAQUIN   traMADol 50 MG tablet Commonly known as: ULTRAM     TAKE these medications   amLODipine 10 MG tablet Commonly known as: NORVASC Take 1 tablet (10 mg total) by mouth daily at 8 pm.   apixaban 2.5 MG Tabs tablet Commonly known as: ELIQUIS Take 1 tablet (2.5 mg total) by mouth 2 (two) times daily.   aspirin EC 81  MG tablet Take 1 tablet (81 mg total) by mouth daily.   atorvastatin 80 MG tablet Commonly known as: LIPITOR Take 1 tablet (80 mg total) by mouth at bedtime.   carvedilol 25 MG tablet Commonly known as: COREG Take 1 tablet (25 mg total) by mouth 2 (two) times daily with a meal.   feeding supplement (NEPRO CARB STEADY) Liqd Take 237 mLs by mouth 2 (two) times daily between meals.   gabapentin 100 MG  capsule Commonly known as: NEURONTIN Take 1 capsule (100 mg total) by mouth 3 (three) times daily.   hydrALAZINE 25 MG tablet Commonly known as: APRESOLINE Take 1 tablet (25 mg total) by mouth every 8 (eight) hours.   hydrOXYzine 25 MG tablet Commonly known as: ATARAX/VISTARIL Take 25 mg by mouth 3 (three) times daily as needed for anxiety.   loratadine 10 MG tablet Commonly known as: CLARITIN Take 1 tablet (10 mg total) by mouth daily.   b complex-C-folic acid 1 MG capsule Take 1 capsule by mouth. With supper.   multivitamin Tabs tablet Take 1 tablet by mouth daily.   neomycin-bacitracin-polymyxin Oint Commonly known as: NEOSPORIN Apply 1 application topically 2 (two) times daily.   sevelamer carbonate 800 MG tablet Commonly known as: RENVELA Take 2 tablets (1,600 mg total) by mouth 3 (three) times daily with meals.       Today  Patient seen and evaluated today No shortness of breath Weaned off oxygen Dialysis today Hemodynamically stable VITAL SIGNS:  Blood pressure 126/76, pulse 84, temperature 98.6 F (37 C), temperature source Oral, resp. rate 14, height 5\' 1"  (1.549 m), weight 46.8 kg, SpO2 96 %.  I/O:    Intake/Output Summary (Last 24 hours) at 07/23/2019 1340 Last data filed at 07/23/2019 1315 Gross per 24 hour  Intake 240 ml  Output 3600 ml  Net -3360 ml    PHYSICAL EXAMINATION:  Physical Exam  GENERAL:  43 y.o.-year-old patient lying in the bed with no acute distress.  LUNGS: Normal breath sounds bilaterally, no wheezing, rales,rhonchi or crepitation. No use of accessory muscles of respiration.  CARDIOVASCULAR: S1, S2 normal. No murmurs, rubs, or gallops.  ABDOMEN: Soft, non-tender, non-distended. Bowel sounds present. No organomegaly or mass.  NEUROLOGIC: Moves all 4 extremities. PSYCHIATRIC: The patient is alert and oriented x 3.  SKIN: No obvious rash, lesion, or ulcer.   DATA REVIEW:   CBC Recent Labs  Lab 07/23/19 0423  WBC 7.9  HGB  8.5*  HCT 26.9*  PLT 160    Chemistries  Recent Labs  Lab 07/20/19 1029  07/23/19 0423  NA 138   < > 141  K 3.6   < > 4.6  CL 102   < > 102  CO2 24   < > 27  GLUCOSE 111*   < > 83  BUN 30*   < > 39*  CREATININE 4.39*   < > 4.48*  CALCIUM 9.4   < > 8.5*  AST 26  --   --   ALT 18  --   --   ALKPHOS 81  --   --   BILITOT 1.8*  --   --    < > = values in this interval not displayed.    Cardiac Enzymes No results for input(s): TROPONINI in the last 168 hours.  Microbiology Results  Results for orders placed or performed during the hospital encounter of 07/20/19  SARS Coronavirus 2 [facility] order, Performed in [facility] hospital lab) Nasopharyngeal Nasopharyngeal Swab  Status: None   Collection Time: 07/20/19 10:29 AM   Specimen: Nasopharyngeal Swab  Result Value Ref Range Status   SARS Coronavirus 2 NEGATIVE NEGATIVE Final    Comment: (NOTE) If result is NEGATIVE SARS-CoV-2 target nucleic acids are NOT DETECTED. The SARS-CoV-2 RNA is generally detectable in upper and lower  respiratory specimens during the acute phase of infection. The lowest  concentration of SARS-CoV-2 viral copies this assay can detect is 250  copies / mL. A negative result does not preclude SARS-CoV-2 infection  and should not be used as the sole basis for treatment or other  patient management decisions.  A negative result may occur with  improper specimen collection / handling, submission of specimen other  than nasopharyngeal swab, presence of viral mutation(s) within the  areas targeted by this assay, and inadequate number of viral copies  (<250 copies / mL). A negative result must be combined with clinical  observations, patient history, and epidemiological information. If result is POSITIVE SARS-CoV-2 target nucleic acids are DETECTED. The SARS-CoV-2 RNA is generally detectable in upper and lower  respiratory specimens dur ing the acute phase of infection.  Positive  results are  indicative of active infection with SARS-CoV-2.  Clinical  correlation with patient history and other diagnostic information is  necessary to determine patient infection status.  Positive results do  not rule out bacterial infection or co-infection with other viruses. If result is PRESUMPTIVE POSTIVE SARS-CoV-2 nucleic acids MAY BE PRESENT.   A presumptive positive result was obtained on the submitted specimen  and confirmed on repeat testing.  While 2019 novel coronavirus  (SARS-CoV-2) nucleic acids may be present in the submitted sample  additional confirmatory testing may be necessary for epidemiological  and / or clinical management purposes  to differentiate between  SARS-CoV-2 and other Sarbecovirus currently known to infect humans.  If clinically indicated additional testing with an alternate test  methodology [phone]) is advised. The SARS-CoV-2 RNA is generally  detectable in upper and lower respiratory sp ecimens during the acute  phase of infection. The expected result is Negative. Fact Sheet for Patients:  StrictlyIdeas.no Fact Sheet for Healthcare Providers: BankingDealers.co.za This test is not yet approved or cleared by the [place] FDA and has been authorized for detection and/or diagnosis of SARS-CoV-2 by FDA under an Emergency Use Authorization (EUA).  This EUA will remain in effect (meaning this test can be used) for the duration of the COVID-19 declaration under Section 564(b)(1) of the Act, 21 U.S.C. section 360bbb-3(b)(1), unless the authorization is terminated or revoked sooner. Performed at [facility], [address] [place], [place] [address]   Body fluid culture     Status: None (Preliminary result)   Collection Time: 07/21/19  1:31 PM   Specimen: PATH Cytology Pleural fluid  Result Value Ref Range Status   Specimen Description   Final    PLEURAL Performed at [facility], [address], [place], [place] [address]    Special Requests   Final    NONE Performed at [facility], [address] [place], [place] [address]    Gram Stain   Final    FEW WBC PRESENT,BOTH PMN AND MONONUCLEAR NO ORGANISMS SEEN    Culture   Final    NO GROWTH 2 DAYS Performed at [facility], [place] [address], [place], [place] [address]    Report Status PENDING  Incomplete    RADIOLOGY:  Dg Chest [facility] 1 View  Result  Date: 07/21/2019 CLINICAL DATA:  Status post left thoracentesis. EXAM: PORTABLE CHEST 1 VIEW COMPARISON:  Chest x-ray 07/20/2019. FINDINGS: Dialysis catheter noted stable position. Cardiomegaly with mild bilateral interstitial prominence again noted. Findings suggest CHF. Pneumonitis cannot be excluded. Previous identified large left pleural effusion has been removed by thoracentesis. No pneumothorax post thoracentesis. Left upper extremity vascular stent graft noted. IMPRESSION: 1. Left thoracentesis with significant clearing of pleural effusions. No evidence of pneumothorax post thoracentesis. 2.  Dialysis catheter stable position. 3. Cardiomegaly with bilateral interstitial prominence again noted. Findings suggest CHF. Electronically Signed   By: Marcello Moores  Register   On: 07/21/2019 14:20   US Thoracentesis Asp Pleural Space W/img Guide  Result Date: 07/21/2019 INDICATION: Pleural effusion EXAM: ULTRASOUND GUIDED LEFT THORACENTESIS MEDICATIONS: None. COMPLICATIONS: None. PROCEDURE: An ultrasound guided thoracentesis was thoroughly discussed with the patient and questions answered. The benefits, risks, alternatives and complications were also discussed. The patient understands and wishes to proceed with the procedure. Written consent was obtained. Ultrasound was performed to localize and mark an adequate pocket of fluid in the left chest. The area was then prepped and draped in the normal sterile fashion. 1% Lidocaine was used for local anesthesia. Under ultrasound  guidance a 6 French catheter was introduced. Thoracentesis was performed. The catheter was removed and a dressing applied. FINDINGS: A total of approximately 1.5 L of amber fluid was removed. Samples were sent to the laboratory as requested by the clinical team. IMPRESSION: Successful ultrasound guided left thoracentesis yielding 1.5 L of pleural fluid. Electronically Signed   By: Marcello Moores  Register   On: 07/21/2019 14:07    Follow up with PCP in 1 week.  Management plans discussed with the patient, family and they are in agreement.  CODE STATUS: DNR    Code Status Orders  (From admission, onward)         Start     Ordered   07/20/19 2042  Do not attempt resuscitation (DNR)  Continuous    Question Answer Comment  In the event of cardiac or respiratory ARREST Do not call a "code blue"   In the event of cardiac or respiratory ARREST Do not perform Intubation, CPR, defibrillation or ACLS   In the event of cardiac or respiratory ARREST Use medication by any route, position, wound care, and other measures to relive pain and suffering. May use oxygen, suction and manual treatment of airway obstruction as needed for comfort.      07/20/19 2041        Code Status History    Date Active Date Inactive Code Status Order ID Comments User Context   07/09/2019 0543 07/12/2019 1502 Full Code [id number]  Harrie Foreman, MD Inpatient   07/02/2019 2128 07/06/2019 1557 Full Code [id number]  Henreitta Leber, MD Inpatient   05/19/2019 0603 05/22/2019 1957 Full Code [id number]  Lance Coon, MD Inpatient   02/28/2019 1805 03/03/2019 2234 Full Code [id number]  Vaughan Basta, MD Inpatient   02/21/2019 0835 02/24/2019 1945 Full Code [id number]  Harrie Foreman, MD Inpatient   02/04/2019 1343 02/09/2019 2050 Full Code [id number]  Saundra Shelling, MD ED   01/27/2019 0654 01/29/2019 2049 Partial Code [id number]  Harrie Foreman, MD Inpatient   01/07/2019 2156 01/27/2019 0638 Partial Code [id number]  Tennis Ship, MD  Inpatient   12/25/2018 0158 01/07/2019 2027 Partial Code [id number]  Ina Homes, MD ED   Advance Care Planning Activity      TOTAL TIME TAKING CARE OF THIS PATIENT ON DAY OF  DISCHARGE: more than 35 minutes.   Saundra Shelling M.D on 07/23/2019 at 1:40 PM  Between 7am to 6pm - Pager - [phone]  After 6pm go to [email or web address] - password [email or web address] [place] [facility]  Office  [phone]  CC: Primary care physician; Patient, [name]  Note: This dictation was prepared with Dragon dictation along with smaller phrase technology. Any transcriptional errors that result from this process are unintentional.

## 2019-07-23 NOTE — Progress Notes (Signed)
This note also relates to the following rows which could not be included: Pulse Rate - Cannot attach notes to unvalidated device data Resp - Cannot attach notes to unvalidated device data BP - Cannot attach notes to unvalidated device data SpO2 - Cannot attach notes to unvalidated device data    07/23/19 1330  Vital Signs  Pulse Rate Source Monitor  vitals stable tolerated HD well in bed via CVC functioned well UFG 3544ml no c/os

## 2019-07-23 NOTE — TOC Transition Note (Signed)
Transition of Care [facility]) - CM/SW Discharge Note   Patient Details  Name: Brett Small MRN: [id number] Date of Birth: August 22, 1976  Transition of Care [facility]) CM/SW Contact:  Beverly Sessions, RN Phone Number: 07/23/2019, 4:05 PM   Clinical Narrative:     Patient to discharge today. Malachy Mood with [facility] notified of discharge. Will resume home health orders for RN and PT   Final next level of care: Home w Home Health Services Barriers to Discharge: No Barriers Identified   Patient Goals and CMS Choice   CMS Medicare.gov Compare Post Acute Care list provided to:: Patient Choice offered to / list presented to : Patient  Discharge Placement                       Discharge Plan and Services                          HH Arranged: PT, RN [facility] Agency: [place] Date [place]: 07/23/19   Representative spoke with at [place]: [place]) Interventions     Readmission Risk Interventions Readmission Risk Prevention Plan 07/23/2019 07/22/2019 07/03/2019  Transportation Screening Complete Complete Complete  Medication Review Press photographer) Complete Referral to Pharmacy Complete  PCP or Specialist appointment within 3-5 days of discharge (No Data) Complete (No Data)  PCP/Specialist Appt Not Complete comments - - -  HRI or Home Care Consult Complete Complete Complete  SW Recovery Care/Counseling Consult - Complete -  Palliative Care Screening Not Applicable Not Applicable -  [employer] Not Applicable Not Applicable Not Applicable

## 2019-07-23 NOTE — Progress Notes (Signed)
   07/23/19 0935  Vital Signs  Temp 98.1 F (36.7 C)  Temp Source Oral  Pulse Rate 82  Pulse Rate Source Monitor  Resp 16  BP 116/89  BP Location Right Arm  BP Method Automatic  Patient Position (if appropriate) Lying  Oxygen Therapy  SpO2 99 %  O2 Device Room Air  Pulse Oximetry Type Continuous  Pain Assessment  Pain Scale 0-10  Pain Score 4  Pain Type Surgical pain  Pain Location Back  Pain Intervention(s) Medication (See eMAR) (PT COMFORTABLE AT THIS TIME)  Time-Out for Hemodialysis  What Procedure? HEMODIALYSIS  Pt Identifiers(min of two) First/Last Name;MRN/Account#  Correct Site? Yes  Correct Side? Yes  Correct Procedure? Yes  Consents Verified? Yes  Rad Studies Available? N/A  Safety Precautions Reviewed? Yes  Engineer, civil (consulting) Number 3  Station Number 1  UF/Alarm Test Passed  Conductivity: Meter 14.2  Conductivity: Machine  14.2  pH 7.6  Normal Saline Lot Number [id number]  Dialyzer Lot Number [id number]  Disposable Set Lot Number [id number]  Dialysate Acid Bath Lot Number [id number]  Dialysate HCO3 Bath Lot Number [id number]  Machine Temperature 98.6 F (37 C)  Musician and Audible Yes  Blood Lines Intact and Secured Yes  Pre Treatment Patient Checks  Vascular access used during treatment Catheter  HD catheter dressing before treatment WDL  Hepatitis B Surface Antigen Results Negative  Date Hepatitis B Surface Antigen Drawn 07/02/19  Hepatitis B Surface Antibody  (<10)  Date Hepatitis B Surface Antibody Drawn 05/19/19  Hemodialysis Consent Verified Yes  Hemodialysis Standing Orders Initiated Yes  ECG (Telemetry) Monitor On Yes  Prime Ordered Normal Saline  Length of  DialysisTreatment -hour(s) 3.5 Hour(s)  Dialyzer Elisio 17H NR  Dialysate 2K;2.5 Ca  Dialysate Flow Ordered 600  Blood Flow Rate Ordered 400 mL/min  Ultrafiltration Goal 3 Liters  Dialysis Blood Pressure Support Ordered Normal Saline  PT STABLE HD VIA BED NO C/OS UFG 3.0L CVC WDL

## 2019-07-23 NOTE — Progress Notes (Signed)
Brett Small to be D/C'd patient's boarding house per MD order.  Discussed prescriptions and follow up appointments with the patient. Prescriptions given to patient, medication list explained in detail. Pt verbalized understanding.  Allergies as of 07/23/2019       Reactions   Codeine Nausea Only   Patient questioned this (??)   Sulfa Antibiotics Hives, Nausea And Vomiting        Medication List     STOP taking these medications    levofloxacin 500 MG tablet Commonly known as: LEVAQUIN   traMADol 50 MG tablet Commonly known as: ULTRAM       TAKE these medications    amLODipine 10 MG tablet Commonly known as: NORVASC Take 1 tablet (10 mg total) by mouth daily at 8 pm.   apixaban 2.5 MG Tabs tablet Commonly known as: ELIQUIS Take 1 tablet (2.5 mg total) by mouth 2 (two) times daily.   aspirin EC 81 MG tablet Take 1 tablet (81 mg total) by mouth daily.   atorvastatin 80 MG tablet Commonly known as: LIPITOR Take 1 tablet (80 mg total) by mouth at bedtime.   carvedilol 25 MG tablet Commonly known as: COREG Take 1 tablet (25 mg total) by mouth 2 (two) times daily with a meal.   feeding supplement (NEPRO CARB STEADY) Liqd Take 237 mLs by mouth 2 (two) times daily between meals.   gabapentin 100 MG capsule Commonly known as: NEURONTIN Take 1 capsule (100 mg total) by mouth 3 (three) times daily.   hydrALAZINE 25 MG tablet Commonly known as: APRESOLINE Take 1 tablet (25 mg total) by mouth every 8 (eight) hours.   hydrOXYzine 25 MG tablet Commonly known as: ATARAX/VISTARIL Take 25 mg by mouth 3 (three) times daily as needed for anxiety.   loratadine 10 MG tablet Commonly known as: CLARITIN Take 1 tablet (10 mg total) by mouth daily.   b complex-C-folic acid 1 MG capsule Take 1 capsule by mouth. With supper.   multivitamin Tabs tablet Take 1 tablet by mouth daily.   neomycin-bacitracin-polymyxin Oint Commonly known as: NEOSPORIN Apply 1 application  topically 2 (two) times daily.   sevelamer carbonate 800 MG tablet Commonly known as: RENVELA Take 2 tablets (1,600 mg total) by mouth 3 (three) times daily with meals.        Vitals:   07/23/19 1315 07/23/19 1402  BP: 126/76 125/82  Pulse: 84 93  Resp: 14 20  Temp: 98.6 F (37 C) 98.8 F (37.1 C)  SpO2: 96%     Skin clean, dry and intact without evidence of skin break down, no evidence of skin tears noted. IV catheter discontinued intact. Site without signs and symptoms of complications. Dressing and pressure applied. Pt denies pain at this time. No complaints noted.  An After Visit Summary was printed and given to the patient. Patient escorted via [place], and D/C home via private auto.  [place] A Brett Small

## 2019-07-23 NOTE — Progress Notes (Signed)
[facility]  ROUNDING NOTE   Subjective:   Seen and examined on hemodialysis treatment. Tolerating treatment well. UF goal of 3 liters.     HEMODIALYSIS FLOWSHEET:  Blood Flow Rate (mL/min): 400 mL/min Arterial Pressure (mmHg): -130 mmHg Venous Pressure (mmHg): 110 mmHg Transmembrane Pressure (mmHg): 70 mmHg Ultrafiltration Rate (mL/min): 100 mL/min Dialysate Flow Rate (mL/min): 600 ml/min Conductivity: Machine : 14.2 Conductivity: Machine : 14.2 Dialysis Fluid Bolus: Normal Saline Bolus Amount (mL): 250 mL Dialysate Change: (3k 2.5ca)    Objective:  Vital signs in last 24 hours:  Temp:  [97.5 F (36.4 C)-98.1 F (36.7 C)] 98.1 F (36.7 C) (10/01 0935) Pulse Rate:  [80-91] 81 (10/01 1045) Resp:  [10-17] 11 (10/01 1045) BP: (107-129)/(75-101) 118/94 (10/01 1045) SpO2:  [91 %-100 %] 91 % (10/01 1045)  Weight change:  Filed Weights   07/20/19 2024 07/21/19 1437 07/21/19 1810  Weight: 48.7 kg 48.7 kg 46.8 kg    Intake/Output: I/O last 3 completed shifts: In: 71 [P.O.:720] Out: 800 [Urine:200; Emesis/NG output:600]   Intake/Output this shift:  No intake/output data recorded.  Physical Exam: General: NAD,   Head: Normocephalic, atraumatic. Moist oral mucosal membranes  Eyes: Anicteric, PERRL  Neck: Supple, trachea midline  Lungs:  Right diminished.   Heart: Regular rate and rhythm  Abdomen:  Soft, nontender,   Extremities:  no peripheral edema.  Neurologic: Nonfocal, moving all four extremities  Skin: No lesions  Access: RIJ permcath    Basic Metabolic Panel: Recent Labs  Lab 07/20/19 1029 07/21/19 1453 07/23/19 0423  NA 138 139 141  K 3.6 3.9 4.6  CL 102 105 102  CO2 24 27 27   GLUCOSE 111* 96 83  BUN 30* 33* 39*  CREATININE 4.39* 4.14* 4.48*  CALCIUM 9.4 8.6* 8.5*    Liver Function Tests: Recent Labs  Lab 07/20/19 1029  AST 26  ALT 18  ALKPHOS 81  BILITOT 1.8*  PROT 7.2  ALBUMIN 3.1*   Recent Labs  Lab 07/20/19 1029   LIPASE 72*   No results for input(s): AMMONIA in the last 168 hours.  CBC: Recent Labs  Lab 07/20/19 1029 07/21/19 1453 07/23/19 0423  WBC 11.0* 3.9* 7.9  NEUTROABS 9.3*  --   --   HGB 9.8* 8.2* 8.5*  HCT 31.2* 26.1* 26.9*  MCV 102.6* 103.2* 101.1*  PLT 234 153 160    Cardiac Enzymes: No results for input(s): CKTOTAL, CKMB, CKMBINDEX, TROPONINI in the last 168 hours.  BNP: Invalid input(s): POCBNP  CBG: No results for input(s): GLUCAP in the last 168 hours.  Microbiology: Results for orders placed or performed during the hospital encounter of 07/20/19  SARS Coronavirus 2 [facility] order, Performed in [facility] hospital lab) Nasopharyngeal Nasopharyngeal Swab     Status: None   Collection Time: 07/20/19 10:29 AM   Specimen: Nasopharyngeal Swab  Result Value Ref Range Status   SARS Coronavirus 2 NEGATIVE NEGATIVE Final    Comment: (NOTE) If result is NEGATIVE SARS-CoV-2 target nucleic acids are NOT DETECTED. The SARS-CoV-2 RNA is generally detectable in upper and lower  respiratory specimens during the acute phase of infection. The lowest  concentration of SARS-CoV-2 viral copies this assay can detect is 250  copies / mL. A negative result does not preclude SARS-CoV-2 infection  and should not be used as the sole basis for treatment or other  patient management decisions.  A negative result may occur with  improper specimen collection / handling, submission of specimen other  than nasopharyngeal swab, presence of viral mutation(s) within the  areas targeted by this assay, and inadequate number of viral copies  (<250 copies / mL). A negative result must be combined with clinical  observations, patient history, and epidemiological information. If result is POSITIVE SARS-CoV-2 target nucleic acids are DETECTED. The SARS-CoV-2 RNA is generally detectable in upper and lower  respiratory specimens dur ing the acute phase of infection.  Positive  results are indicative  of active infection with SARS-CoV-2.  Clinical  correlation with patient history and other diagnostic information is  necessary to determine patient infection status.  Positive results do  not rule out bacterial infection or co-infection with other viruses. If result is PRESUMPTIVE POSTIVE SARS-CoV-2 nucleic acids MAY BE PRESENT.   A presumptive positive result was obtained on the submitted specimen  and confirmed on repeat testing.  While 2019 novel coronavirus  (SARS-CoV-2) nucleic acids may be present in the submitted sample  additional confirmatory testing may be necessary for epidemiological  and / or clinical management purposes  to differentiate between  SARS-CoV-2 and other Sarbecovirus currently known to infect humans.  If clinically indicated additional testing with an alternate test  methodology [phone]) is advised. The SARS-CoV-2 RNA is generally  detectable in upper and lower respiratory sp ecimens during the acute  phase of infection. The expected result is Negative. Fact Sheet for Patients:  StrictlyIdeas.no Fact Sheet for Healthcare Providers: BankingDealers.co.za This test is not yet approved or cleared by the [place] FDA and has been authorized for detection and/or diagnosis of SARS-CoV-2 by FDA under an Emergency Use Authorization (EUA).  This EUA will remain in effect (meaning this test can be used) for the duration of the COVID-19 declaration under Section 564(b)(1) of the Act, 21 U.S.C. section 360bbb-3(b)(1), unless the authorization is terminated or revoked sooner. Performed at [facility], [address] [place], [place] [address]   Body fluid culture     Status: None (Preliminary result)   Collection Time: 07/21/19  1:31 PM   Specimen: PATH Cytology Pleural fluid  Result Value Ref Range Status   Specimen Description   Final    PLEURAL Performed at [facility], [address], [place], [place] [address]    Special Requests   Final    NONE Performed at [facility], [address] [place], [place] [address]    Gram Stain   Final    FEW WBC PRESENT,BOTH PMN AND MONONUCLEAR NO ORGANISMS SEEN    Culture   Final    NO GROWTH 2 DAYS Performed at [facility], [place] [address], [place],  [address]    Report Status PENDING  Incomplete    Coagulation Studies: No results for input(s): LABPROT, INR in the last 72 hours.  Urinalysis: No results for input(s): COLORURINE, LABSPEC, PHURINE, GLUCOSEU, HGBUR, BILIRUBINUR, KETONESUR, PROTEINUR, UROBILINOGEN, NITRITE, LEUKOCYTESUR in the last 72 hours.  Invalid input(s): APPERANCEUR    Imaging: Dg Chest Port 1 View  Result Date: 07/21/2019 CLINICAL DATA:  Status post left thoracentesis. EXAM: PORTABLE CHEST 1 VIEW COMPARISON:  Chest x-ray 07/20/2019. FINDINGS: Dialysis catheter noted stable position. Cardiomegaly with mild bilateral interstitial prominence again noted. Findings suggest CHF. Pneumonitis cannot be excluded. Previous identified large left pleural effusion has been removed by thoracentesis. No pneumothorax post thoracentesis. Left upper extremity vascular stent graft noted. IMPRESSION: 1. Left thoracentesis with significant clearing of pleural effusions. No evidence of pneumothorax post thoracentesis. 2.  Dialysis catheter stable position.  3. Cardiomegaly with bilateral interstitial prominence again noted. Findings suggest CHF. Electronically Signed   By: Marcello Moores  Register   On: 07/21/2019 14:20   US Thoracentesis Asp Pleural Space W/img Guide  Result Date: 07/21/2019 INDICATION: Pleural effusion EXAM: ULTRASOUND GUIDED LEFT THORACENTESIS MEDICATIONS: None. COMPLICATIONS: None. PROCEDURE: An ultrasound guided thoracentesis was thoroughly discussed with the patient and questions answered. The benefits, risks, alternatives and complications were also discussed. The patient understands and  wishes to proceed with the procedure. Written consent was obtained. Ultrasound was performed to localize and mark an adequate pocket of fluid in the left chest. The area was then prepped and draped in the normal sterile fashion. 1% Lidocaine was used for local anesthesia. Under ultrasound guidance a 6 French catheter was introduced. Thoracentesis was performed. The catheter was removed and a dressing applied. FINDINGS: A total of approximately 1.5 L of amber fluid was removed. Samples were sent to the laboratory as requested by the clinical team. IMPRESSION: Successful ultrasound guided left thoracentesis yielding 1.5 L of pleural fluid. Electronically Signed   By: Marcello Moores  Register   On: 07/21/2019 14:07     Medications:    . amLODipine  10 mg Oral Q2000  . apixaban  2.5 mg Oral BID  . aspirin EC  81 mg Oral Daily  . atorvastatin  80 mg Oral QHS  . carvedilol  25 mg Oral BID WC  . Chlorhexidine Gluconate Cloth  6 each Topical Daily  . feeding supplement (NEPRO CARB STEADY)  237 mL Oral BID BM  . gabapentin  100 mg Oral TID  . hydrALAZINE  25 mg Oral Q8H  . loratadine  10 mg Oral Daily  . multivitamin  1 tablet Oral QHS  . sevelamer carbonate  1,600 mg Oral TID WC   acetaminophen, docusate sodium, guaiFENesin-dextromethorphan, hydrOXYzine, ondansetron (ZOFRAN) IV, oxyCODONE-acetaminophen  Assessment/ Plan:  [name] is a 43 y.o. white malewith end stage renal disease on hemodialysis, hypertension, peripheral vascular disease, depression who is admitted to [facility] on 07/09/2019 for pneumonia  [place]. TTS 46.5 kg Left AVG  1. End Stage Renal Disease:seen and examined on hemodialysis treatment. Tolerating treatment well.  - Continue TTS schedule.   2. Pleural effusions: left thoracentesis 9/29, 1.5 liters removed. transudative labs.  - weaning oxygen  3. Anemia of chronic kidney disease: hemoglobin 8.5. Macrocytic.  - EPO with HD treatment  4. Hypertension:    - amlodipine, carvedilol and hydralazine.  5. Secondary Hyperparathyroidism - sevelamer   LOS: 3 Michaelia Beilfuss 10/1/202010:57 AM

## 2019-07-23 NOTE — Progress Notes (Signed)
   07/23/19 0935  Neurological  Level of Consciousness Alert  Orientation Level Oriented X4  Respiratory  Respiratory Pattern Regular;Unlabored  Bilateral Breath Sounds Clear  Cardiac  Pulse Regular  ECG Monitor Yes  PT ARRIVED TO UNIT AY 0930 VIA BED WITH TRANSPORT, STABLE NO DISTRESS NOTED.

## 2019-07-24 ENCOUNTER — Other Ambulatory Visit (INDEPENDENT_AMBULATORY_CARE_PROVIDER_SITE_OTHER): Payer: Self-pay | Admitting: Vascular Surgery

## 2019-07-24 DIAGNOSIS — N186 End stage renal disease: Secondary | ICD-10-CM

## 2019-07-25 LAB — BODY FLUID CULTURE: Culture: NO GROWTH

## 2019-07-27 ENCOUNTER — Other Ambulatory Visit: Payer: Self-pay

## 2019-07-27 ENCOUNTER — Emergency Department: Payer: Medicare Other

## 2019-07-27 ENCOUNTER — Encounter (INDEPENDENT_AMBULATORY_CARE_PROVIDER_SITE_OTHER): Payer: Medicare Other

## 2019-07-27 ENCOUNTER — Other Ambulatory Visit (INDEPENDENT_AMBULATORY_CARE_PROVIDER_SITE_OTHER): Payer: Medicare Other

## 2019-07-27 ENCOUNTER — Encounter (INDEPENDENT_AMBULATORY_CARE_PROVIDER_SITE_OTHER): Payer: Medicare Other | Admitting: Vascular Surgery

## 2019-07-27 ENCOUNTER — Inpatient Hospital Stay
Admission: EM | Admit: 2019-07-27 | Discharge: 2019-07-29 | DRG: 640 | Disposition: A | Discharge status: Home Health | Payer: Medicare Other | Attending: Internal Medicine | Admitting: Internal Medicine

## 2019-07-27 ENCOUNTER — Encounter: Payer: Self-pay | Admitting: Emergency Medicine

## 2019-07-27 DIAGNOSIS — T82868A Thrombosis of vascular prosthetic devices, implants and grafts, initial encounter: Secondary | ICD-10-CM | POA: Diagnosis present

## 2019-07-27 DIAGNOSIS — Z992 Dependence on renal dialysis: Secondary | ICD-10-CM

## 2019-07-27 DIAGNOSIS — Z66 Do not resuscitate: Secondary | ICD-10-CM | POA: Diagnosis present

## 2019-07-27 DIAGNOSIS — J811 Chronic pulmonary edema: Secondary | ICD-10-CM | POA: Diagnosis present

## 2019-07-27 DIAGNOSIS — J9601 Acute respiratory failure with hypoxia: Secondary | ICD-10-CM | POA: Diagnosis present

## 2019-07-27 DIAGNOSIS — F329 Major depressive disorder, single episode, unspecified: Secondary | ICD-10-CM | POA: Diagnosis present

## 2019-07-27 DIAGNOSIS — N186 End stage renal disease: Secondary | ICD-10-CM | POA: Diagnosis present

## 2019-07-27 DIAGNOSIS — E8770 Fluid overload, unspecified: Secondary | ICD-10-CM | POA: Diagnosis present

## 2019-07-27 DIAGNOSIS — Z79899 Other long term (current) drug therapy: Secondary | ICD-10-CM

## 2019-07-27 DIAGNOSIS — Z833 Family history of diabetes mellitus: Secondary | ICD-10-CM | POA: Diagnosis not present

## 2019-07-27 DIAGNOSIS — Z20828 Contact with and (suspected) exposure to other viral communicable diseases: Secondary | ICD-10-CM | POA: Diagnosis present

## 2019-07-27 DIAGNOSIS — Y832 Surgical operation with anastomosis, bypass or graft as the cause of abnormal reaction of the patient, or of later complication, without mention of misadventure at the time of the procedure: Secondary | ICD-10-CM | POA: Diagnosis not present

## 2019-07-27 DIAGNOSIS — N2581 Secondary hyperparathyroidism of renal origin: Secondary | ICD-10-CM | POA: Diagnosis present

## 2019-07-27 DIAGNOSIS — R0602 Shortness of breath: Secondary | ICD-10-CM | POA: Diagnosis present

## 2019-07-27 DIAGNOSIS — I132 Hypertensive heart and chronic kidney disease with heart failure and with stage 5 chronic kidney disease, or end stage renal disease: Secondary | ICD-10-CM | POA: Diagnosis present

## 2019-07-27 DIAGNOSIS — T82898A Other specified complication of vascular prosthetic devices, implants and grafts, initial encounter: Secondary | ICD-10-CM | POA: Diagnosis not present

## 2019-07-27 DIAGNOSIS — Z9115 Patient's noncompliance with renal dialysis: Secondary | ICD-10-CM

## 2019-07-27 DIAGNOSIS — F17210 Nicotine dependence, cigarettes, uncomplicated: Secondary | ICD-10-CM | POA: Diagnosis present

## 2019-07-27 DIAGNOSIS — Z7901 Long term (current) use of anticoagulants: Secondary | ICD-10-CM | POA: Diagnosis not present

## 2019-07-27 DIAGNOSIS — D631 Anemia in chronic kidney disease: Secondary | ICD-10-CM | POA: Diagnosis present

## 2019-07-27 DIAGNOSIS — Z7982 Long term (current) use of aspirin: Secondary | ICD-10-CM

## 2019-07-27 DIAGNOSIS — Z23 Encounter for immunization: Secondary | ICD-10-CM | POA: Diagnosis present

## 2019-07-27 DIAGNOSIS — I5021 Acute systolic (congestive) heart failure: Secondary | ICD-10-CM | POA: Diagnosis present

## 2019-07-27 DIAGNOSIS — I739 Peripheral vascular disease, unspecified: Secondary | ICD-10-CM | POA: Diagnosis present

## 2019-07-27 LAB — BASIC METABOLIC PANEL
Anion gap: 16 — ABNORMAL HIGH (ref 5–15)
BUN: 68 mg/dL — ABNORMAL HIGH (ref 6–20)
CO2: 22 mmol/L (ref 22–32)
Calcium: 8.5 mg/dL — ABNORMAL LOW (ref 8.9–10.3)
Chloride: 98 mmol/L (ref 98–111)
Creatinine, Ser: 6.53 mg/dL — ABNORMAL HIGH (ref 0.61–1.24)
GFR calc Af Amer: 11 mL/min — ABNORMAL LOW (ref >60)
GFR calc non Af Amer: 9 mL/min — ABNORMAL LOW (ref >60)
Glucose, Bld: 113 mg/dL — ABNORMAL HIGH (ref 70–99)
Potassium: 5 mmol/L (ref 3.5–5.1)
Sodium: 136 mmol/L (ref 135–145)

## 2019-07-27 LAB — CBC WITH DIFFERENTIAL/PLATELET
Abs Immature Granulocytes: 0.14 10*3/uL — ABNORMAL HIGH (ref 0.00–0.07)
Basophils Absolute: 0.1 10*3/uL (ref 0.0–0.1)
Basophils Relative: 1 %
Eosinophils Absolute: 0.6 10*3/uL — ABNORMAL HIGH (ref 0.0–0.5)
Eosinophils Relative: 4 %
HCT: 32.9 % — ABNORMAL LOW (ref 39.0–52.0)
Hemoglobin: 10.2 g/dL — ABNORMAL LOW (ref 13.0–17.0)
Immature Granulocytes: 1 %
Lymphocytes Relative: 12 %
Lymphs Abs: 1.6 10*3/uL (ref 0.7–4.0)
MCH: 32.3 pg (ref 26.0–34.0)
MCHC: 31 g/dL (ref 30.0–36.0)
MCV: 104.1 fL — ABNORMAL HIGH (ref 80.0–100.0)
Monocytes Absolute: 0.5 10*3/uL (ref 0.1–1.0)
Monocytes Relative: 3 %
Neutro Abs: 10.9 10*3/uL — ABNORMAL HIGH (ref 1.7–7.7)
Neutrophils Relative %: 79 %
Platelets: 191 10*3/uL (ref 150–400)
RBC: 3.16 MIL/uL — ABNORMAL LOW (ref 4.22–5.81)
RDW: 17.3 % — ABNORMAL HIGH (ref 11.5–15.5)
WBC: 13.8 10*3/uL — ABNORMAL HIGH (ref 4.0–10.5)
nRBC: 0 % (ref 0.0–0.2)

## 2019-07-27 LAB — GLUCOSE, CAPILLARY: Glucose-Capillary: 82 mg/dL (ref 70–99)

## 2019-07-27 LAB — TROPONIN I (HIGH SENSITIVITY)
Troponin I (High Sensitivity): 44 ng/L — ABNORMAL HIGH (ref <18)
Troponin I (High Sensitivity): 59 ng/L — ABNORMAL HIGH (ref <18)

## 2019-07-27 LAB — SARS CORONAVIRUS 2 BY RT PCR (HOSPITAL ORDER, PERFORMED IN ~~LOC~~ HOSPITAL LAB): SARS Coronavirus 2: NEGATIVE

## 2019-07-27 MED ORDER — SEVELAMER CARBONATE 800 MG PO TABS
1600.0000 mg | ORAL_TABLET | Freq: Three times a day (TID) | ORAL | Status: DC
  Administered 2019-07-28 – 2019-07-29 (×4): 1600 mg via ORAL
  Filled 2019-07-27 (×4): qty 2

## 2019-07-27 MED ORDER — OXYCODONE HCL 5 MG PO TABS
5.0000 mg | ORAL_TABLET | ORAL | Status: DC | PRN
  Administered 2019-07-27 – 2019-07-29 (×5): 5 mg via ORAL
  Filled 2019-07-27 (×5): qty 1

## 2019-07-27 MED ORDER — ASPIRIN EC 81 MG PO TBEC
81.0000 mg | DELAYED_RELEASE_TABLET | Freq: Every day | ORAL | Status: DC
  Administered 2019-07-28 – 2019-07-29 (×2): 81 mg via ORAL
  Filled 2019-07-27 (×3): qty 1

## 2019-07-27 MED ORDER — ACETAMINOPHEN 325 MG PO TABS: 650.0000 mg | ORAL_TABLET | Freq: Four times a day (QID) | ORAL | Status: DC | PRN

## 2019-07-27 MED ORDER — HEPARIN SODIUM (PORCINE) 5000 UNIT/ML IJ SOLN: 5000.0000 [IU] | Freq: Three times a day (TID) | INTRAMUSCULAR | Status: DC

## 2019-07-27 MED ORDER — ATORVASTATIN CALCIUM 20 MG PO TABS
80.0000 mg | ORAL_TABLET | Freq: Every day | ORAL | Status: DC
  Administered 2019-07-27 – 2019-07-28 (×2): 80 mg via ORAL
  Filled 2019-07-27 (×2): qty 1
  Filled 2019-07-27 (×2): qty 4

## 2019-07-27 MED ORDER — PNEUMOCOCCAL VAC POLYVALENT 25 MCG/0.5ML IJ INJ
0.5000 mL | INJECTION | INTRAMUSCULAR | Status: DC
  Filled 2019-07-27: qty 0.5

## 2019-07-27 MED ORDER — HEPARIN SODIUM (PORCINE) 1000 UNIT/ML DIALYSIS: 20.0000 [IU]/kg | INTRAMUSCULAR | Status: DC | PRN

## 2019-07-27 MED ORDER — CHLORHEXIDINE GLUCONATE CLOTH 2 % EX PADS
6.0000 | MEDICATED_PAD | Freq: Every day | CUTANEOUS | Status: DC
  Administered 2019-07-27 – 2019-07-28 (×2): 6 via TOPICAL
  Filled 2019-07-27: qty 6

## 2019-07-27 MED ORDER — LORATADINE 10 MG PO TABS
10.0000 mg | ORAL_TABLET | Freq: Every day | ORAL | Status: DC
  Administered 2019-07-28 – 2019-07-29 (×2): 10 mg via ORAL
  Filled 2019-07-27 (×2): qty 1

## 2019-07-27 MED ORDER — ACETAMINOPHEN 650 MG RE SUPP: 650.0000 mg | Freq: Four times a day (QID) | RECTAL | Status: DC | PRN

## 2019-07-27 MED ORDER — SODIUM CHLORIDE 0.9% FLUSH
3.0000 mL | Freq: Two times a day (BID) | INTRAVENOUS | Status: DC
  Administered 2019-07-27 – 2019-07-29 (×5): 3 mL via INTRAVENOUS

## 2019-07-27 MED ORDER — HYDROXYZINE HCL 25 MG PO TABS: 25.0000 mg | ORAL_TABLET | Freq: Three times a day (TID) | ORAL | Status: DC | PRN

## 2019-07-27 MED ORDER — MORPHINE SULFATE (PF) 4 MG/ML IV SOLN
4.0000 mg | Freq: Once | INTRAVENOUS | Status: AC
  Administered 2019-07-27: 4 mg via INTRAVENOUS
  Filled 2019-07-27: qty 1

## 2019-07-27 MED ORDER — ONDANSETRON HCL 4 MG/2ML IJ SOLN: 4.0000 mg | Freq: Four times a day (QID) | INTRAMUSCULAR | Status: DC | PRN

## 2019-07-27 MED ORDER — HYDRALAZINE HCL 25 MG PO TABS
25.0000 mg | ORAL_TABLET | Freq: Three times a day (TID) | ORAL | Status: DC
  Administered 2019-07-27 – 2019-07-29 (×4): 25 mg via ORAL
  Filled 2019-07-27 (×4): qty 1

## 2019-07-27 MED ORDER — GABAPENTIN 100 MG PO CAPS
100.0000 mg | ORAL_CAPSULE | Freq: Three times a day (TID) | ORAL | Status: DC
  Administered 2019-07-27 – 2019-07-29 (×5): 100 mg via ORAL
  Filled 2019-07-27 (×5): qty 1

## 2019-07-27 MED ORDER — POLYETHYLENE GLYCOL 3350 17 G PO PACK: 17.0000 g | PACK | Freq: Every day | ORAL | Status: DC | PRN

## 2019-07-27 MED ORDER — ALBUTEROL SULFATE (2.5 MG/3ML) 0.083% IN NEBU
2.5000 mg | INHALATION_SOLUTION | RESPIRATORY_TRACT | Status: DC | PRN
  Administered 2019-07-28: 08:00:00 2.5 mg via RESPIRATORY_TRACT
  Filled 2019-07-27: qty 3

## 2019-07-27 MED ORDER — APIXABAN 2.5 MG PO TABS
2.5000 mg | ORAL_TABLET | Freq: Two times a day (BID) | ORAL | Status: DC
  Administered 2019-07-27 – 2019-07-29 (×4): 2.5 mg via ORAL
  Filled 2019-07-27 (×4): qty 1

## 2019-07-27 MED ORDER — CARVEDILOL 25 MG PO TABS
25.0000 mg | ORAL_TABLET | Freq: Two times a day (BID) | ORAL | Status: DC
  Administered 2019-07-28 – 2019-07-29 (×2): 25 mg via ORAL
  Filled 2019-07-27 (×2): qty 1

## 2019-07-27 MED ORDER — INFLUENZA VAC SPLIT QUAD 0.5 ML IM SUSY
0.5000 mL | PREFILLED_SYRINGE | INTRAMUSCULAR | Status: AC
  Administered 2019-07-29: 0.5 mL via INTRAMUSCULAR
  Filled 2019-07-27: qty 0.5

## 2019-07-27 MED ORDER — ONDANSETRON HCL 4 MG PO TABS: 4.0000 mg | ORAL_TABLET | Freq: Four times a day (QID) | ORAL | Status: DC | PRN

## 2019-07-27 MED ORDER — AMLODIPINE BESYLATE 10 MG PO TABS
10.0000 mg | ORAL_TABLET | Freq: Every day | ORAL | Status: DC
  Administered 2019-07-27 – 2019-07-28 (×2): 10 mg via ORAL
  Filled 2019-07-27 (×2): qty 1

## 2019-07-27 NOTE — ED Notes (Signed)
IV team at bedside 

## 2019-07-27 NOTE — ED Notes (Signed)
Respiratory Therapist at bedside. Bipap applied at this time, O2 Sat at 98%.

## 2019-07-27 NOTE — Progress Notes (Signed)
Established hemodialysis patient known at [facility] TTS 11:00. Patient normally rides with [facility]. Please contact me directly with any dialysis placement concerns.   [name] Dialysis Coordinator [phone]

## 2019-07-27 NOTE — ED Notes (Signed)
Nephrologist at bedside

## 2019-07-27 NOTE — Progress Notes (Signed)
Treatment completed no problems to note, 3L removed   07/27/19 2004  Vital Signs  Temp 98.4 F (36.9 C)  Temp Source Oral  Pulse Rate 98  Pulse Rate Source Monitor  Resp 17  BP (!) 143/101  BP Location Right Arm  BP Method Automatic  Patient Position (if appropriate) Lying  Oxygen Therapy  SpO2 100 %  O2 Device Nasal Cannula  O2 Flow Rate (L/min) 2 L/min  Pain Assessment  Pain Scale 0-10  Pain Score 0  During Hemodialysis Assessment  Blood Flow Rate (mL/min) 150 mL/min  Arterial Pressure (mmHg) 0 mmHg  Venous Pressure (mmHg) 80 mmHg  Transmembrane Pressure (mmHg) 70 mmHg  Ultrafiltration Rate (mL/min) 0 mL/min  Dialysate Flow Rate (mL/min) 600 ml/min  Conductivity: Machine  13.9  HD Safety Checks Performed Yes  KECN 87.7 KECN  Dialysis Fluid Bolus Normal Saline  Bolus Amount (mL) 250 mL  Intra-Hemodialysis Comments Tolerated well;Tx completed  Post-Hemodialysis Assessment  Rinseback Volume (mL) 250 mL  KECN 87.7 V  Dialyzer Clearance Clear  Duration of HD Treatment -hour(s) 4 hour(s)  Hemodialysis Intake (mL) 500 mL  UF Total -Machine (mL) 3500 mL  Net UF (mL) 3000 mL  Tolerated HD Treatment Yes  Education / Care Plan  Dialysis Education Provided Yes  Documented Education in Care Plan Yes  Outpatient Plan of Care Reviewed and on Chart Yes  Fistula / Graft Left Upper arm Arteriovenous vein graft  Placement Date/Time: 12/26/18 1024   Placed prior to admission: No  Orientation: Left  Access Location: Upper arm  Access Type: (c) Arteriovenous vein graft  Removal Reason: Other (Comment)  Site Condition No complications  Hemodialysis Catheter Right Internal jugular Double lumen Temporary (Non-Tunneled)  No Placement Date or Time found.   Placed prior to admission: Yes  Orientation: Right  Access Location: Internal jugular  Hemodialysis Catheter Type: Double lumen Temporary (Non-Tunneled)  Site Condition No complications  Blue Lumen Status Flushed;Heparin locked  Red  Lumen Status Flushed;Heparin locked  Purple Lumen Status N/A  Catheter fill solution Heparin 1000 units/ml  Catheter fill volume (Arterial) 2 cc  Catheter fill volume (Venous) 2.2  Dressing Type Biopatch;Occlusive  Dressing Status Clean;Dry;Intact  Drainage Description None  Post treatment catheter status Capped and Clamped

## 2019-07-27 NOTE — H&P (Signed)
Brett Small NAME: Brett Small    MR#:  [id number]  DATE OF BIRTH:  03-02-1976  DATE OF ADMISSION:  07/27/2019  PRIMARY CARE PHYSICIAN: Patient, [name]   REQUESTING/REFERRING PHYSICIAN: Dr. Charna Archer  CHIEF COMPLAINT:   Chief Complaint  Patient presents with  . Shortness of Breath    HISTORY OF PRESENT ILLNESS:  Brett Small  is a 43 y.o. male with a known history of ESRD, hypertension, chronic kidney disease, secondary hyperparathyroidism of renal origin, AV shunt and fistula, aortofemoral stent on anticoagulation presented to the emergency room today with complaints of worsening shortness of breath.  Patient had missed hemodialysis on Saturday as he did not have a ride.  Here he was found to be extremely short of breath, tachypneic and had to be placed on BiPAP.  Chest x-ray showing pulmonary edema.  Patient with significant fluid overload needing emergent hemodialysis.  Case discussed with nephrology Dr. Candiss Norse.  Patient will be admitted to stepdown unit for hemodialysis and further management.  Afebrile.  COVID-19 test negative. Patient continues to have shortness of breath on BiPAP at this time.  PAST MEDICAL HISTORY:   Past Medical History:  Diagnosis Date  . ESRD (end stage renal disease) ([place])   . Hypertension   . PAD (peripheral artery disease) ([facility])    Required aortofemoral stent-which had closed and had to redo the procedure and ischemia of limb.  . Peripheral vascular disease ([place])   . Renal disorder   . Secondary hyperparathyroidism of renal origin [facility])     PAST SURGICAL HISTORY:   Past Surgical History:  Procedure Laterality Date  . A/V SHUNT INTERVENTION Left 01/19/2019   Procedure: LEFT UPPER EXTREMITY A/V SHUNTOGRAM / UPPER EXTREMITY ANGIOGRAM;  Surgeon: Algernon Huxley, MD;  Location: [place] [facility];  Service: Cardiovascular;  Laterality: Left;  . A/V SHUNT INTERVENTION Left 03/02/2019   Procedure:  A/V SHUNT INTERVENTION;  Surgeon: Algernon Huxley, MD;  Location: [place] [facility];  Service: Cardiovascular;  Laterality: Left;  . AORTA - FEMORAL ARTERY BYPASS GRAFT    . AV FISTULA PLACEMENT Left 12/26/2018   Procedure: INSERTION OF GORE STRETCH VASCULAR 4-7MM X  45CM IN LEFT UPPER ARM;  Surgeon: Marty Heck, MD;  Location: [place];  Service: Vascular;  Laterality: Left;  . DIALYSIS/PERMA CATHETER REMOVAL N/A 02/06/2019   Procedure: DIALYSIS/PERMA CATHETER REMOVAL;  Surgeon: Algernon Huxley, MD;  Location: [place] [facility];  Service: Cardiovascular;  Laterality: N/A;  . LEFT HEART CATH AND CORONARY ANGIOGRAPHY Right 07/10/2019   Procedure: LEFT HEART CATH AND CORONARY ANGIOGRAPHY;  Surgeon: Dionisio David, MD;  Location: [place] [facility];  Service: Cardiovascular;  Laterality: Right;    SOCIAL HISTORY:   Social History   Tobacco Use  . Smoking status: Current Every Day Smoker    Packs/day: 0.50    Years: 30.00    Pack years: 15.00    Types: Cigarettes    Last attempt to quit: 06/26/2018    Years since quitting: 1.0  . Smokeless tobacco: Never Used  . Tobacco comment: smoked for 30 years   Substance Use Topics  . Alcohol use: Not Currently    FAMILY HISTORY:   Family History  Problem Relation Age of Onset  . Hypertension Other   . Diabetes Other   . Clotting disorder Father     DRUG ALLERGIES:   Allergies  Allergen Reactions  . Codeine Nausea  Only    Patient questioned this (??)  . Sulfa Antibiotics Hives and Nausea And Vomiting    REVIEW OF SYSTEMS:   Review of Systems  Constitutional: Positive for malaise/fatigue. Negative for chills, fever and weight loss.  HENT: Negative for hearing loss and nosebleeds.   Eyes: Negative for blurred vision, double vision and pain.  Respiratory: Positive for cough and shortness of breath. Negative for hemoptysis, sputum production and wheezing.   Cardiovascular: Positive for orthopnea. Negative for chest pain,  palpitations and leg swelling.  Gastrointestinal: Negative for abdominal pain, constipation, diarrhea, nausea and vomiting.  Genitourinary: Negative for dysuria and hematuria.  Musculoskeletal: Negative for back pain, falls and myalgias.  Skin: Negative for rash.  Neurological: Negative for dizziness, tremors, sensory change, speech change, focal weakness, seizures and headaches.  Endo/Heme/Allergies: Does not bruise/bleed easily.  Psychiatric/Behavioral: Negative for depression and memory loss. The patient is not nervous/anxious.     MEDICATIONS AT HOME:   Prior to Admission medications   Medication Sig Start Date End Date Taking? Authorizing Provider  amLODipine (NORVASC) 10 MG tablet Take 1 tablet (10 mg total) by mouth daily at 8 pm. 02/24/19  Yes Gladstone Lighter, MD  apixaban (ELIQUIS) 2.5 MG TABS tablet Take 1 tablet (2.5 mg total) by mouth 2 (two) times daily. 01/26/19  Yes Clapacs, Madie Reno, MD  aspirin EC 81 MG tablet Take 1 tablet (81 mg total) by mouth daily. 01/26/19  Yes Clapacs, Madie Reno, MD  atorvastatin (LIPITOR) 80 MG tablet Take 1 tablet (80 mg total) by mouth at bedtime. 01/26/19  Yes Clapacs, Madie Reno, MD  b complex-C-folic acid 1 MG capsule Take 1 capsule by mouth daily after supper.    Yes [provider]  carvedilol (COREG) 25 MG tablet Take 1 tablet (25 mg total) by mouth 2 (two) times daily with a meal. 01/26/19  Yes Clapacs, Madie Reno, MD  gabapentin (NEURONTIN) 100 MG capsule Take 1 capsule (100 mg total) by mouth 3 (three) times daily. 01/26/19  Yes Clapacs, Madie Reno, MD  hydrALAZINE (APRESOLINE) 25 MG tablet Take 1 tablet (25 mg total) by mouth every 8 (eight) hours. 01/26/19  Yes Clapacs, Madie Reno, MD  hydrOXYzine (ATARAX/VISTARIL) 25 MG tablet Take 25 mg by mouth 3 (three) times daily as needed for anxiety.   Yes [provider]  loratadine (CLARITIN) 10 MG tablet Take 1 tablet (10 mg total) by mouth daily. 03/04/19  Yes Salary, Avel Peace, MD  multivitamin (RENA-VIT)  TABS tablet Take 1 tablet by mouth daily. 01/27/19  Yes Clapacs, Madie Reno, MD  neomycin-bacitracin-polymyxin (NEOSPORIN) OINT Apply 1 application topically 2 (two) times daily. 01/26/19  Yes Clapacs, Madie Reno, MD  Nutritional Supplements (FEEDING SUPPLEMENT, NEPRO CARB STEADY,) LIQD Take 237 mLs by mouth 2 (two) times daily between meals. 07/06/19  Yes Epifanio Lesches, MD  sevelamer carbonate (RENVELA) 800 MG tablet Take 2 tablets (1,600 mg total) by mouth 3 (three) times daily with meals. 01/26/19  Yes Clapacs, Madie Reno, MD     VITAL SIGNS:  Blood pressure (!) 136/91, pulse 100, temperature 97.6 F (36.4 C), temperature source Oral, resp. rate 12, height 5\' 1"  (1.549 m), weight 46.8 kg, SpO2 99 %.  PHYSICAL EXAMINATION:  Physical Exam  GENERAL:  43 y.o.-year-old patient lying in the bed with respiratory distress EYES: Pupils equal, round, reactive to light and accommodation. No scleral icterus. Extraocular muscles intact.  HEENT: Head atraumatic, normocephalic. Oropharynx and nasopharynx clear. No oropharyngeal erythema, moist oral mucosa  NECK:  Supple, no jugular venous distention. No thyroid enlargement, no tenderness.  LUNGS: Decreased air entry.  Mild wheezing.  Increased work of breathing CARDIOVASCULAR: S1, S2 normal. No murmurs, rubs, or gallops.  ABDOMEN: Soft, nontender, nondistended. Bowel sounds present. No organomegaly or mass.  EXTREMITIES: No  cyanosis, or clubbing. + 2 pedal & radial pulses b/l.  Bilateral lower extremity edema NEUROLOGIC: Cranial nerves II through XII are intact. No focal Motor or sensory deficits appreciated b/l PSYCHIATRIC: The patient is alert and oriented x 3.  Anxious SKIN: No obvious rash, lesion, or ulcer.   LABORATORY PANEL:   CBC Recent Labs  Lab 07/27/19 0815  WBC 13.8*  HGB 10.2*  HCT 32.9*  PLT 191   ------------------------------------------------------------------------------------------------------------------  Chemistries  Recent Labs   Lab 07/27/19 0941  NA 136  K 5.0  CL 98  CO2 22  GLUCOSE 113*  BUN 68*  CREATININE 6.53*  CALCIUM 8.5*   ------------------------------------------------------------------------------------------------------------------  Cardiac Enzymes No results for input(s): TROPONINI in the last 168 hours. ------------------------------------------------------------------------------------------------------------------  RADIOLOGY:  Dg Chest Portable 1 View  Result Date: 07/27/2019 CLINICAL DATA:  Pt complains of SOB. Pt on dialysis and last treatment was on Thursday. Pt presents with shortness of breath, diaphoresis, and generalized edema. EXAM: PORTABLE CHEST 1 VIEW COMPARISON:  Chest radiograph 07/21/2019, 07/20/2019 FINDINGS: Stable cardiomediastinal contours with enlarged heart size. Unchanged position of a right central venous catheter. Central venous congestion. Increased bilateral predominantly lower lung interstitial opacities suspicious for edema. New opacification of the left lung base may reflect atelectasis, infiltrate not excluded. Possible small left pleural effusion. No pneumothorax. No acute finding in the visualized skeleton. IMPRESSION: 1. Increased bilateral predominantly lower lung interstitial opacities suspicious for pulmonary edema. 2. New opacification of the left lung base may reflect atelectasis, infiltrate not excluded. Electronically Signed   By: Audie Pinto M.D.   On: 07/27/2019 08:20     IMPRESSION AND PLAN:   *Acute pulmonary edema from fluid overload after missing hemodialysis.  Patient will be placed on BiPAP and admitted to stepdown unit.  Urgent hemodialysis.  Discussed with nephrology Dr. Candiss Norse.  This has been arranged.  Wean off BiPAP as tolerated after hemodialysis.  Patient is critically ill.  Does not make any urine.  *Hypertension.  Continue home medications.  *Peripheral arterial disease on anticoagulation.  Continue Eliquis.  *Tobacco abuse.   Counseled to quit smoking for greater than 3 minutes.  All the records are reviewed and case discussed with ED provider. Management plans discussed with the patient, family and they are in agreement.  CODE STATUS: Full code  Discussed with patient in detail regarding CODE STATUS.  In the past he has been DNR in the hospital.  Today I wanted to confirm this but patient requested that he be changed to full code with aggressive medical care/defibrillation/CPR/intubation and ventilatory support if needed.  TOTAL CRITICAL CARE TIME TAKING CARE OF THIS PATIENT: 80 minutes.   Leia Alf Kindell Strada M.D on 07/27/2019 at 11:30 AM  Between 7am to 6pm - Pager - [phone]  After 6pm go to [email or web address] - password [email or web address] [facility]  Office  [phone]  CC: Primary care physician; Patient, [name]  Note: This dictation was prepared with Dragon dictation along with smaller phrase technology. Any transcriptional errors that result from this process are unintentional.

## 2019-07-27 NOTE — Consult Note (Signed)
Name: Brett Small MRN: [id number] DOB: 01/15/1976     CONSULTATION DATE: 07/27/2019  REFERRING MD :  Dr. Charna Archer  CHIEF COMPLAINT:  Shortness of breath  SIGNIFICANT EVENTS/STUDIES:  10/5 Admission to the ICU on BiPAP after missing regular dialysis on Saturday because he could not get a ride. Urgent hemodialysis pending.  HISTORY OF PRESENT ILLNESS:   Brett Small is a 43 yo male with history of ESRD, hypertension, and PAD presented to the ED with shortness of breath. Pt admits that he missed his normal Saturday dialysis treatment because he could not get a ride. He was found tachypneic and tachycardic in ED and placed on BiPAP. He was then transferred to the ICU for emergent hemodialysis. CXR suspicious for left lower lobe edema vs. Infiltrate. Pt endorses sternal chest pain that is dull and RUQ abdominal tenderness to palpation. He endorses lower extremity edema.   PAST MEDICAL HISTORY :   has a past medical history of ESRD (end stage renal disease) ([place]), Hypertension, PAD (peripheral artery disease) ([place]), Peripheral vascular disease ([place]), Renal disorder, and Secondary hyperparathyroidism of renal origin ([place]).  has a past surgical history that includes Aorta - femoral artery bypass graft; AV fistula placement (Left, 12/26/2018); A/V SHUNT INTERVENTION (Left, 01/19/2019); DIALYSIS/PERMA CATHETER REMOVAL (N/A, 02/06/2019); A/V SHUNT INTERVENTION (Left, 03/02/2019); and LEFT HEART CATH AND CORONARY ANGIOGRAPHY (Right, 07/10/2019). Prior to Admission medications   Medication Sig Start Date End Date Taking? Authorizing Provider  amLODipine (NORVASC) 10 MG tablet Take 1 tablet (10 mg total) by mouth daily at 8 pm. 02/24/19  Yes Gladstone Lighter, MD  apixaban (ELIQUIS) 2.5 MG TABS tablet Take 1 tablet (2.5 mg total) by mouth 2 (two) times daily. 01/26/19  Yes Clapacs, Madie Reno, MD  aspirin EC 81 MG tablet Take 1 tablet (81 mg total) by mouth daily. 01/26/19  Yes Clapacs, Madie Reno, MD  atorvastatin  (LIPITOR) 80 MG tablet Take 1 tablet (80 mg total) by mouth at bedtime. 01/26/19  Yes Clapacs, Madie Reno, MD  b complex-C-folic acid 1 MG capsule Take 1 capsule by mouth daily after supper.    Yes [provider]  carvedilol (COREG) 25 MG tablet Take 1 tablet (25 mg total) by mouth 2 (two) times daily with a meal. 01/26/19  Yes Clapacs, Madie Reno, MD  gabapentin (NEURONTIN) 100 MG capsule Take 1 capsule (100 mg total) by mouth 3 (three) times daily. 01/26/19  Yes Clapacs, Madie Reno, MD  hydrALAZINE (APRESOLINE) 25 MG tablet Take 1 tablet (25 mg total) by mouth every 8 (eight) hours. 01/26/19  Yes Clapacs, Madie Reno, MD  hydrOXYzine (ATARAX/VISTARIL) 25 MG tablet Take 25 mg by mouth 3 (three) times daily as needed for anxiety.   Yes [provider]  loratadine (CLARITIN) 10 MG tablet Take 1 tablet (10 mg total) by mouth daily. 03/04/19  Yes Salary, Avel Peace, MD  multivitamin (RENA-VIT) TABS tablet Take 1 tablet by mouth daily. 01/27/19  Yes Clapacs, Madie Reno, MD  neomycin-bacitracin-polymyxin (NEOSPORIN) OINT Apply 1 application topically 2 (two) times daily. 01/26/19  Yes Clapacs, Madie Reno, MD  Nutritional Supplements (FEEDING SUPPLEMENT, NEPRO CARB STEADY,) LIQD Take 237 mLs by mouth 2 (two) times daily between meals. 07/06/19  Yes Epifanio Lesches, MD  sevelamer carbonate (RENVELA) 800 MG tablet Take 2 tablets (1,600 mg total) by mouth 3 (three) times daily with meals. 01/26/19  Yes Clapacs, Madie Reno, MD   Allergies  Allergen Reactions   Codeine Nausea Only    Patient questioned this (??)  Sulfa Antibiotics Hives and Nausea And Vomiting    FAMILY HISTORY:  family history includes Clotting disorder in his father; Diabetes in an other family member; Hypertension in an other family member. SOCIAL HISTORY:  reports that he has been smoking cigarettes. He has a 15.00 pack-year smoking history. He has never used smokeless tobacco. He reports previous alcohol use. He reports current drug use. Frequency:  1.00 time per week. Drug: Marijuana.  Review of Systems:  Gen:  Denies  fever, sweats, chills weight loss  HEENT: Denies blurred vision, double vision, ear pain, eye pain, hearing loss, nose bleeds, sore throat Cardiac:  Endorses substernal chest pain. No dizziness or heaviness, chest tightness,edema, No JVD Resp:  Endorses SOB, resolved with BiPAP. No cough, -sputum production,-wheezing, -hemoptysis,  Gi: Denies swallowing difficulty, stomach pain, nausea or vomiting, diarrhea, constipation, bowel incontinence Gu:  Denies bladder incontinence, burning urine Ext:   Denies Joint pain, stiffness or swelling Skin: Denies  skin rash, easy bruising or bleeding or hives Endoc:  Denies polyuria, polydipsia , polyphagia or weight change Psych:   Denies depression, insomnia or hallucinations  Other:  All other systems negative   VITAL SIGNS: Temp:  [97.6 F (36.4 C)-97.9 F (36.6 C)] 97.9 F (36.6 C) (10/05 1228) Pulse Rate:  [98-139] 100 (10/05 1228) Resp:  [12-26] 15 (10/05 1228) BP: (128-195)/(91-142) 131/97 (10/05 1228) SpO2:  [88 %-100 %] 88 % (10/05 1228) FiO2 (%):  [50 %] 50 % (10/05 1228) Weight:  [46.8 kg-49 kg] 49 kg (10/05 1228)   No intake/output data recorded. No intake/output data recorded.   SpO2: (!) 88 % FiO2 (%): 50 %   Physical Examination:  GENERAL: in no acute distress on BiPAP HEAD: Normocephalic, atraumatic.  EYES: Pupils equal, round, reactive to light.  No scleral icterus.  MOUTH: Moist mucosal membrane. NECK: Supple. No JVD.  PULMONARY: +mild bilateral crackles CARDIOVASCULAR: S1 and S2. Regular rate and rhythm. No murmurs, rubs, or gallops.  GASTROINTESTINAL: Tender to palpation in RUQ.   Soft,  -distended. No masses. Positive bowel sounds. No hepatosplenomegaly.  MUSCULOSKELETAL: Bilateral mild pitting edema in ankles. NEUROLOGIC: A&Ox3 SKIN:intact,warm,dry  I personally reviewed lab work that was obtained in last 24 hrs. CXR Independently  reviewed  MEDICATIONS: I have reviewed all medications and confirmed regimen as documented   CULTURE RESULTS   Recent Results (from the past 240 hour(s))  SARS Coronavirus 2 [facility] order, Performed in [facility] hospital lab) Nasopharyngeal Nasopharyngeal Swab     Status: None   Collection Time: 07/20/19 10:29 AM   Specimen: Nasopharyngeal Swab  Result Value Ref Range Status   SARS Coronavirus 2 NEGATIVE NEGATIVE Final    Comment: (NOTE) If result is NEGATIVE SARS-CoV-2 target nucleic acids are NOT DETECTED. The SARS-CoV-2 RNA is generally detectable in upper and lower  respiratory specimens during the acute phase of infection. The lowest  concentration of SARS-CoV-2 viral copies this assay can detect is 250  copies / mL. A negative result does not preclude SARS-CoV-2 infection  and should not be used as the sole basis for treatment or other  patient management decisions.  A negative result may occur with  improper specimen collection / handling, submission of specimen other  than nasopharyngeal swab, presence of viral mutation(s) within the  areas targeted by this assay, and inadequate number of viral copies  (<250 copies / mL). A negative result must be combined with clinical  observations, patient history, and epidemiological information. If result is POSITIVE SARS-CoV-2 target nucleic acids  are DETECTED. The SARS-CoV-2 RNA is generally detectable in upper and lower  respiratory specimens dur ing the acute phase of infection.  Positive  results are indicative of active infection with SARS-CoV-2.  Clinical  correlation with patient history and other diagnostic information is  necessary to determine patient infection status.  Positive results do  not rule out bacterial infection or co-infection with other viruses. If result is PRESUMPTIVE POSTIVE SARS-CoV-2 nucleic acids MAY BE PRESENT.   A presumptive positive result was obtained on the submitted specimen  and confirmed  on repeat testing.  While 2019 novel coronavirus  (SARS-CoV-2) nucleic acids may be present in the submitted sample  additional confirmatory testing may be necessary for epidemiological  and / or clinical management purposes  to differentiate between  SARS-CoV-2 and other Sarbecovirus currently known to infect humans.  If clinically indicated additional testing with an alternate test  methodology [phone]) is advised. The SARS-CoV-2 RNA is generally  detectable in upper and lower respiratory sp ecimens during the acute  phase of infection. The expected result is Negative. Fact Sheet for Patients:  StrictlyIdeas.no Fact Sheet for Healthcare Providers: BankingDealers.co.za This test is not yet approved or cleared by the [place] FDA and has been authorized for detection and/or diagnosis of SARS-CoV-2 by FDA under an Emergency Use Authorization (EUA).  This EUA will remain in effect (meaning this test can be used) for the duration of the COVID-19 declaration under Section 564(b)(1) of the Act, 21 U.S.C. section 360bbb-3(b)(1), unless the authorization is terminated or revoked sooner. Performed at [facility], [address] [place], [place] [address]   Body fluid culture     Status: None   Collection Time: 07/21/19  1:31 PM   Specimen: PATH Cytology Pleural fluid  Result Value Ref Range Status   Specimen Description   Final    PLEURAL Performed at [facility], [address], [place], [place] [address]    Special Requests   Final    NONE Performed at [facility], [address] [place], [place] [address]    Gram Stain   Final    FEW WBC PRESENT,BOTH PMN AND MONONUCLEAR NO ORGANISMS SEEN    Culture   Final    NO GROWTH 3 DAYS Performed at [facility], [place] [address] [place], [place] [address]    Report Status 07/25/2019 FINAL  Final  SARS Coronavirus 2 [facility] order, Performed in  [facility] hospital lab) Nasopharyngeal Nasopharyngeal Swab     Status: None   Collection Time: 07/27/19  8:16 AM   Specimen: Nasopharyngeal Swab  Result Value Ref Range Status   SARS Coronavirus 2 NEGATIVE NEGATIVE Final    Comment: (NOTE) If result is NEGATIVE SARS-CoV-2 target nucleic acids are NOT DETECTED. The SARS-CoV-2 RNA is generally detectable in upper and lower  respiratory specimens during the acute phase of infection. The lowest  concentration of SARS-CoV-2 viral copies this assay can detect is 250  copies / mL. A negative result does not preclude SARS-CoV-2 infection  and should not be used as the sole basis for treatment or other  patient management decisions.  A negative result may occur with  improper specimen collection / handling, submission of specimen other  than nasopharyngeal swab, presence of viral mutation(s) within the  areas targeted by this assay, and inadequate number of viral copies  (<250 copies / mL). A negative result must be combined with clinical  observations, patient history, and epidemiological information. If  result is POSITIVE SARS-CoV-2 target nucleic acids are DETECTED. The SARS-CoV-2 RNA is generally detectable in upper and lower  respiratory specimens dur ing the acute phase of infection.  Positive  results are indicative of active infection with SARS-CoV-2.  Clinical  correlation with patient history and other diagnostic information is  necessary to determine patient infection status.  Positive results do  not rule out bacterial infection or co-infection with other viruses. If result is PRESUMPTIVE POSTIVE SARS-CoV-2 nucleic acids MAY BE PRESENT.   A presumptive positive result was obtained on the submitted specimen  and confirmed on repeat testing.  While 2019 novel coronavirus  (SARS-CoV-2) nucleic acids may be present in the submitted sample  additional confirmatory testing may be necessary for epidemiological  and / or clinical  management purposes  to differentiate between  SARS-CoV-2 and other Sarbecovirus currently known to infect humans.  If clinically indicated additional testing with an alternate test  methodology [phone]) is advised. The SARS-CoV-2 RNA is generally  detectable in upper and lower respiratory sp ecimens during the acute  phase of infection. The expected result is Negative. Fact Sheet for Patients:  StrictlyIdeas.no Fact Sheet for Healthcare Providers: BankingDealers.co.za This test is not yet approved or cleared by the [place] FDA and has been authorized for detection and/or diagnosis of SARS-CoV-2 by FDA under an Emergency Use Authorization (EUA).  This EUA will remain in effect (meaning this test can be used) for the duration of the COVID-19 declaration under Section 564(b)(1) of the Act, 21 U.S.C. section 360bbb-3(b)(1), unless the authorization is terminated or revoked sooner. Performed at [facility], [address] [place], [place] [address]           IMAGING    Dg Chest Portable 1 View  Result Date: 07/27/2019 CLINICAL DATA:  Pt complains of SOB. Pt on dialysis and last treatment was on Thursday. Pt presents with shortness of breath, diaphoresis, and generalized edema. EXAM: PORTABLE CHEST 1 VIEW COMPARISON:  Chest radiograph 07/21/2019, 07/20/2019 FINDINGS: Stable cardiomediastinal contours with enlarged heart size. Unchanged position of a right central venous catheter. Central venous congestion. Increased bilateral predominantly lower lung interstitial opacities suspicious for edema. New opacification of the left lung base may reflect atelectasis, infiltrate not excluded. Possible small left pleural effusion. No pneumothorax. No acute finding in the visualized skeleton. IMPRESSION: 1. Increased bilateral predominantly lower lung interstitial opacities suspicious for pulmonary edema. 2. New opacification of the left  lung base may reflect atelectasis, infiltrate not excluded. Electronically Signed   By: Audie Pinto M.D.   On: 07/27/2019 08:20     ASSESSMENT AND PLAN SYNOPSIS 43 yo male with ESRD on hemodialysis (TTS) presents to ICU in acute hypoxic respiratory failure secondary to pulmonary edema fluid overload and electrolyte abnormalities from missing dialysis on Saturday.  Severe ACUTE Hypoxic Respiratory Failure Resolved with biPAP - continue Bronchodilator Therapy - urgent hemodialysis pending  ESRD - urgent hemodialysis as soon as possible - follow up nephrology recomendations  ACUTE SYSTOLIC CARDIAC FAILURE- EF 50-55% with moderate diffuse hypokinesis -oxygen as needed -hemodialysis -follow up cardiac enzymes as indicated  NEUROLOGY - intubated and sedated - minimal sedation to achieve a RASS goal: -1 Wake up assessment pending  CARDIAC ICU monitoring  GI GI PROPHYLAXIS as indicated  NUTRITIONAL STATUS DIET-->TF's as tolerated Constipation protocol as indicated  ENDO - will use ICU hypoglycemic\Hyperglycemia protocol if needed  ELECTROLYTES -follow labs as needed -replace as needed -pharmacy consultation and following  TRANSFUSIONS AS NEEDED MONITOR FSBS  ASSESS the need for LABS   Pt is stepdown status.  Nira Conn, PA-S

## 2019-07-27 NOTE — Progress Notes (Signed)
[facility], [place] 07/27/19  Subjective:   LOS: 0 No intake/output data recorded. Patient missed his dialysis on Saturday due to lack of ride This morning started to become short of breath therefore presented to the emergency room Denies fevers or chills No nausea or vomiting Appetite has been good Appears to be volume overload.  Requiring BiPAP in the emergency room Urgent hemodialysis requested COVID test is negative  Objective:  Vital signs in last 24 hours:  Temp:  [97.6 F (36.4 C)] 97.6 F (36.4 C) (10/05 0805) Pulse Rate:  [102-139] 102 (10/05 1030) Resp:  [16-26] 16 (10/05 1030) BP: (134-195)/(98-142) 134/98 (10/05 1030) SpO2:  [93 %-100 %] 99 % (10/05 1030) Weight:  [46.8 kg] 46.8 kg (10/05 0807)  Weight change:  Filed Weights   07/27/19 0807  Weight: 46.8 kg    Intake/Output:   No intake or output data in the 24 hours ending 07/27/19 1101   Physical Exam: General:  Ill-appearing, laying in the bed  HEENT  moist oral mucous membranes,   Pulm/lungs  BiPAP mask in place, bilateral diffuse crackles  CVS/Heart  tachycardic, no rub  Abdomen:   Soft, nontender  Extremities:  Pitting edema present bilaterally  Neurologic:  Alert, able to answer questions  Skin:  Warm, dry  Access:  Right IJ PermCath       Basic Metabolic Panel:  Recent Labs  Lab 07/21/19 1453 07/23/19 0423 07/27/19 0941  NA 139 141 136  K 3.9 4.6 5.0  CL 105 102 98  CO2 27 27 22   GLUCOSE 96 83 113*  BUN 33* 39* 68*  CREATININE 4.14* 4.48* 6.53*  CALCIUM 8.6* 8.5* 8.5*     CBC: Recent Labs  Lab 07/21/19 1453 07/23/19 0423 07/27/19 0815  WBC 3.9* 7.9 13.8*  NEUTROABS  --   --  10.9*  HGB 8.2* 8.5* 10.2*  HCT 26.1* 26.9* 32.9*  MCV 103.2* 101.1* 104.1*  PLT 153 160 191      Lab Results  Component Value Date   HEPBSAG Negative 07/02/2019   HEPBSAB Non Reactive 12/25/2018   HEPBIGM Negative 05/19/2019      Microbiology:  Recent  Results (from the past 240 hour(s))  SARS Coronavirus 2 [facility] order, Performed in [facility] hospital lab) Nasopharyngeal Nasopharyngeal Swab     Status: None   Collection Time: 07/20/19 10:29 AM   Specimen: Nasopharyngeal Swab  Result Value Ref Range Status   SARS Coronavirus 2 NEGATIVE NEGATIVE Final    Comment: (NOTE) If result is NEGATIVE SARS-CoV-2 target nucleic acids are NOT DETECTED. The SARS-CoV-2 RNA is generally detectable in upper and lower  respiratory specimens during the acute phase of infection. The lowest  concentration of SARS-CoV-2 viral copies this assay can detect is 250  copies / mL. A negative result does not preclude SARS-CoV-2 infection  and should not be used as the sole basis for treatment or other  patient management decisions.  A negative result may occur with  improper specimen collection / handling, submission of specimen other  than nasopharyngeal swab, presence of viral mutation(s) within the  areas targeted by this assay, and inadequate number of viral copies  (<250 copies / mL). A negative result must be combined with clinical  observations, patient history, and epidemiological information. If result is POSITIVE SARS-CoV-2 target nucleic acids are DETECTED. The SARS-CoV-2 RNA is generally detectable in upper and lower  respiratory specimens dur ing the acute phase of infection.  Positive  results are  indicative of active infection with SARS-CoV-2.  Clinical  correlation with patient history and other diagnostic information is  necessary to determine patient infection status.  Positive results do  not rule out bacterial infection or co-infection with other viruses. If result is PRESUMPTIVE POSTIVE SARS-CoV-2 nucleic acids MAY BE PRESENT.   A presumptive positive result was obtained on the submitted specimen  and confirmed on repeat testing.  While 2019 novel coronavirus  (SARS-CoV-2) nucleic acids may be present in the submitted sample   additional confirmatory testing may be necessary for epidemiological  and / or clinical management purposes  to differentiate between  SARS-CoV-2 and other Sarbecovirus currently known to infect humans.  If clinically indicated additional testing with an alternate test  methodology [phone]) is advised. The SARS-CoV-2 RNA is generally  detectable in upper and lower respiratory sp ecimens during the acute  phase of infection. The expected result is Negative. Fact Sheet for Patients:  StrictlyIdeas.no Fact Sheet for Healthcare Providers: BankingDealers.co.za This test is not yet approved or cleared by the [place] FDA and has been authorized for detection and/or diagnosis of SARS-CoV-2 by FDA under an Emergency Use Authorization (EUA).  This EUA will remain in effect (meaning this test can be used) for the duration of the COVID-19 declaration under Section 564(b)(1) of the Act, 21 U.S.C. section 360bbb-3(b)(1), unless the authorization is terminated or revoked sooner. Performed at [facility], [address] [place], [place] [address]   Body fluid culture     Status: None   Collection Time: 07/21/19  1:31 PM   Specimen: PATH Cytology Pleural fluid  Result Value Ref Range Status   Specimen Description   Final    PLEURAL Performed at [facility] [address], [place], [place] [address]    Special Requests   Final    NONE Performed at [facility], [address] [place], [place] [address]    Gram Stain   Final    FEW WBC PRESENT,BOTH PMN AND MONONUCLEAR NO ORGANISMS SEEN    Culture   Final    NO GROWTH 3 DAYS Performed at [facility], [place] [address] [place],  [address]    Report Status 07/25/2019 FINAL  Final  SARS Coronavirus 2 [facility] order, Performed in [facility] hospital lab) Nasopharyngeal Nasopharyngeal Swab     Status: None   Collection Time: 07/27/19  8:16 AM    Specimen: Nasopharyngeal Swab  Result Value Ref Range Status   SARS Coronavirus 2 NEGATIVE NEGATIVE Final    Comment: (NOTE) If result is NEGATIVE SARS-CoV-2 target nucleic acids are NOT DETECTED. The SARS-CoV-2 RNA is generally detectable in upper and lower  respiratory specimens during the acute phase of infection. The lowest  concentration of SARS-CoV-2 viral copies this assay can detect is 250  copies / mL. A negative result does not preclude SARS-CoV-2 infection  and should not be used as the sole basis for treatment or other  patient management decisions.  A negative result may occur with  improper specimen collection / handling, submission of specimen other  than nasopharyngeal swab, presence of viral mutation(s) within the  areas targeted by this assay, and inadequate number of viral copies  (<250 copies / mL). A negative result must be combined with clinical  observations, patient history, and epidemiological information. If result is POSITIVE SARS-CoV-2 target nucleic acids are DETECTED. The SARS-CoV-2 RNA is generally detectable in upper and lower  respiratory specimens dur ing the acute phase  of infection.  Positive  results are indicative of active infection with SARS-CoV-2.  Clinical  correlation with patient history and other diagnostic information is  necessary to determine patient infection status.  Positive results do  not rule out bacterial infection or co-infection with other viruses. If result is PRESUMPTIVE POSTIVE SARS-CoV-2 nucleic acids MAY BE PRESENT.   A presumptive positive result was obtained on the submitted specimen  and confirmed on repeat testing.  While 2019 novel coronavirus  (SARS-CoV-2) nucleic acids may be present in the submitted sample  additional confirmatory testing may be necessary for epidemiological  and / or clinical management purposes  to differentiate between  SARS-CoV-2 and other Sarbecovirus currently known to infect humans.  If  clinically indicated additional testing with an alternate test  methodology [phone]) is advised. The SARS-CoV-2 RNA is generally  detectable in upper and lower respiratory sp ecimens during the acute  phase of infection. The expected result is Negative. Fact Sheet for Patients:  StrictlyIdeas.no Fact Sheet for Healthcare Providers: BankingDealers.co.za This test is not yet approved or cleared by the [place] FDA and has been authorized for detection and/or diagnosis of SARS-CoV-2 by FDA under an Emergency Use Authorization (EUA).  This EUA will remain in effect (meaning this test can be used) for the duration of the COVID-19 declaration under Section 564(b)(1) of the Act, 21 U.S.C. section 360bbb-3(b)(1), unless the authorization is terminated or revoked sooner. Performed at [facility], [address] [place], [place] [address]     Coagulation Studies: No results for input(s): LABPROT, INR in the last 72 hours.  Urinalysis: No results for input(s): COLORURINE, LABSPEC, PHURINE, GLUCOSEU, HGBUR, BILIRUBINUR, KETONESUR, PROTEINUR, UROBILINOGEN, NITRITE, LEUKOCYTESUR in the last 72 hours.  Invalid input(s): APPERANCEUR    Imaging: Dg Chest Portable 1 View  Result Date: 07/27/2019 CLINICAL DATA:  Pt complains of SOB. Pt on dialysis and last treatment was on Thursday. Pt presents with shortness of breath, diaphoresis, and generalized edema. EXAM: PORTABLE CHEST 1 VIEW COMPARISON:  Chest radiograph 07/21/2019, 07/20/2019 FINDINGS: Stable cardiomediastinal contours with enlarged heart size. Unchanged position of a right central venous catheter. Central venous congestion. Increased bilateral predominantly lower lung interstitial opacities suspicious for edema. New opacification of the left lung base may reflect atelectasis, infiltrate not excluded. Possible small left pleural effusion. No pneumothorax. No acute finding in the  visualized skeleton. IMPRESSION: 1. Increased bilateral predominantly lower lung interstitial opacities suspicious for pulmonary edema. 2. New opacification of the left lung base may reflect atelectasis, infiltrate not excluded. Electronically Signed   By: Audie Pinto M.D.   On: 07/27/2019 08:20     Medications:       Assessment/ Plan:  43 y.o. caucasian male with end-stage renal disease on hemodialysis, hypertension, peripheral vascular disease, depression.  He presents for acute onset of shortness of breath  Active Problems:   * No active hospital problems. *  [place] [facility] 46.5 kg Rt IJ PC 240 min  #. ESRD   -Patient missed his dialysis treatment on Saturday due to lack of transportation -We will arrange for hemodialysis as soon as possible -Ultrafiltration goal 2 to 3 kg as tolerated - discussed with patient. He agrees to proceed  #Volume overload with lower extremity edema and SOB -Fluid removal with hemodialysis as tolerated  #. Anemia of CKD  Lab Results  Component Value Date   HGB 10.2 (L) 07/27/2019  -Epogen with hemodialysis  #. [facility]     Component Value Date/Time  PTH 336 (H) 02/04/2019 1619   Lab Results  Component Value Date   PHOS 3.9 07/12/2019  -Low phosphorus diet    LOS: 0 Brett Small 10/5/202011:01 AM  [facility], [place]

## 2019-07-27 NOTE — ED Provider Notes (Signed)
[facility] Emergency Department Provider Note   ____________________________________________   First MD Initiated Contact with Patient 07/27/19 [phone]     (approximate)  I have reviewed the triage vital signs and the nursing notes.   HISTORY  Chief Complaint Shortness of Breath    HPI Brett Small is a 43 y.o. male with past medical history of ESRD on HD, hypertension, PAD, and secondary hyperparathyroidism presents to the ED complaining of shortness of breath.  Patient states that he missed his dialysis appointment 2 days ago as he did not have a ride.  He last had dialysis 4 days ago, states there were no issues at that time.  He has a TDC in place to his right IJ as his left arm fistula no longer works.  He reports being increasingly short of breath since missing dialysis, denies any associated fevers, cough, or chest pain.  Per EMS, patient noted to have O2 sats in the 80s on room air, subsequently placed on nonrebreather with improvement.  He was additionally given 1-1/2 inches of Nitropaste per EMS.  History is limited as patient is in respiratory distress on arrival.        Past Medical History:  Diagnosis Date  . ESRD (end stage renal disease) ([place])   . Hypertension   . PAD (peripheral artery disease) (H[facility])    Required aortofemoral stent-which had closed and had to redo the procedure and ischemia of limb.  . Peripheral vascular disease ([place])   . Renal disorder   . Secondary hyperparathyroidism of renal origin [facility])     Patient Active Problem List   Diagnosis Date Noted  . Pulmonary edema 07/27/2019  . Pleural effusion 07/20/2019  . NSTEMI (non-ST elevated myocardial infarction) ([place]) 07/09/2019  . Pneumonia 07/02/2019  . Acute liver failure 05/19/2019  . Hematemesis 05/19/2019  . Hepatitis   . Thrombocytopenia ([place])   . Coagulopathy ([place])   . Sepsis ([place]) 02/28/2019  . Lobar pneumonia ([place]) 02/28/2019  . Acute respiratory failure  ([place]) 02/04/2019  . Acute respiratory failure with hypoxemia ([place]) 01/27/2019  . Depression 01/07/2019  . MDD (major depressive disorder), single episode, severe , no psychosis ([place])   . Homelessness   . Acute respiratory failure with hypoxia ([place]) 12/25/2018  . End stage renal disease on dialysis ([place]) 12/25/2018  . Hypertension 12/25/2018  . Renal osteodystrophy 12/25/2018    Past Surgical History:  Procedure Laterality Date  . A/V SHUNT INTERVENTION Left 01/19/2019   Procedure: LEFT UPPER EXTREMITY A/V SHUNTOGRAM / UPPER EXTREMITY ANGIOGRAM;  Surgeon: Algernon Huxley, MD;  Location: [place] [facility];  Service: Cardiovascular;  Laterality: Left;  . A/V SHUNT INTERVENTION Left 03/02/2019   Procedure: A/V SHUNT INTERVENTION;  Surgeon: Algernon Huxley, MD;  Location: [place] [facility];  Service: Cardiovascular;  Laterality: Left;  . AORTA - FEMORAL ARTERY BYPASS GRAFT    . AV FISTULA PLACEMENT Left 12/26/2018   Procedure: INSERTION OF GORE STRETCH VASCULAR 4-7MM X  45CM IN LEFT UPPER ARM;  Surgeon: Marty Heck, MD;  Location: [place];  Service: Vascular;  Laterality: Left;  . DIALYSIS/PERMA CATHETER REMOVAL N/A 02/06/2019   Procedure: DIALYSIS/PERMA CATHETER REMOVAL;  Surgeon: Algernon Huxley, MD;  Location: [place] [facility];  Service: Cardiovascular;  Laterality: N/A;  . LEFT HEART CATH AND CORONARY ANGIOGRAPHY Right 07/10/2019   Procedure: LEFT HEART CATH AND CORONARY ANGIOGRAPHY;  Surgeon: Dionisio David, MD;  Location: [place] [facility];  Service: Cardiovascular;  Laterality: Right;    Prior to Admission medications   Medication Sig Start Date End Date Taking? Authorizing Provider  amLODipine (NORVASC) 10 MG tablet Take 1 tablet (10 mg total) by mouth daily at 8 pm. 02/24/19  Yes Gladstone Lighter, MD  apixaban (ELIQUIS) 2.5 MG TABS tablet Take 1 tablet (2.5 mg total) by mouth 2 (two) times daily. 01/26/19  Yes Clapacs, Madie Reno, MD  aspirin EC 81 MG tablet Take 1 tablet (81 mg  total) by mouth daily. 01/26/19  Yes Clapacs, Madie Reno, MD  atorvastatin (LIPITOR) 80 MG tablet Take 1 tablet (80 mg total) by mouth at bedtime. 01/26/19  Yes Clapacs, Madie Reno, MD  b complex-C-folic acid 1 MG capsule Take 1 capsule by mouth daily after supper.    Yes [provider]  carvedilol (COREG) 25 MG tablet Take 1 tablet (25 mg total) by mouth 2 (two) times daily with a meal. 01/26/19  Yes Clapacs, Madie Reno, MD  gabapentin (NEURONTIN) 100 MG capsule Take 1 capsule (100 mg total) by mouth 3 (three) times daily. 01/26/19  Yes Clapacs, Madie Reno, MD  hydrALAZINE (APRESOLINE) 25 MG tablet Take 1 tablet (25 mg total) by mouth every 8 (eight) hours. 01/26/19  Yes Clapacs, Madie Reno, MD  hydrOXYzine (ATARAX/VISTARIL) 25 MG tablet Take 25 mg by mouth 3 (three) times daily as needed for anxiety.   Yes [provider]  loratadine (CLARITIN) 10 MG tablet Take 1 tablet (10 mg total) by mouth daily. 03/04/19  Yes Salary, Avel Peace, MD  multivitamin (RENA-VIT) TABS tablet Take 1 tablet by mouth daily. 01/27/19  Yes Clapacs, Madie Reno, MD  neomycin-bacitracin-polymyxin (NEOSPORIN) OINT Apply 1 application topically 2 (two) times daily. 01/26/19  Yes Clapacs, Madie Reno, MD  Nutritional Supplements (FEEDING SUPPLEMENT, NEPRO CARB STEADY,) LIQD Take 237 mLs by mouth 2 (two) times daily between meals. 07/06/19  Yes Epifanio Lesches, MD  sevelamer carbonate (RENVELA) 800 MG tablet Take 2 tablets (1,600 mg total) by mouth 3 (three) times daily with meals. 01/26/19  Yes Clapacs, Madie Reno, MD    Allergies Codeine and Sulfa antibiotics  Family History  Problem Relation Age of Onset  . Hypertension Other   . Diabetes Other   . Clotting disorder Father     Social History Social History   Tobacco Use  . Smoking status: Current Every Day Smoker    Packs/day: 0.50    Years: 30.00    Pack years: 15.00    Types: Cigarettes    Last attempt to quit: 06/26/2018    Years since quitting: 1.0  . Smokeless tobacco: Never Used   . Tobacco comment: smoked for 30 years   Substance Use Topics  . Alcohol use: Not Currently  . Drug use: Yes    Frequency: 1.0 times per week    Types: Marijuana    Comment: Pt states "maybe once or twice a week".     Review of Systems  Constitutional: No fever/chills Eyes: No visual changes. ENT: No sore throat. Cardiovascular: Denies chest pain. Respiratory: Positive for shortness of breath. Gastrointestinal: No abdominal pain.  No nausea, no vomiting.  No diarrhea.  No constipation. Genitourinary: Negative for dysuria. Musculoskeletal: Negative for back pain. Skin: Negative for rash. Neurological: Negative for headaches, focal weakness or numbness.  ____________________________________________   PHYSICAL EXAM:  VITAL SIGNS: ED Triage Vitals  Enc Vitals Group     BP      Pulse      Resp  Temp      Temp src      SpO2      Weight      Height      Head Circumference      Peak Flow      Pain Score      Pain Loc      Pain Edu?      Excl. in [place]?     Constitutional: Alert and oriented, diaphoretic. Eyes: Conjunctivae are normal. Head: Atraumatic. Nose: No congestion/rhinnorhea. Mouth/Throat: Mucous membranes are moist. Neck: Normal ROM Cardiovascular: Tachycardic, regular rhythm. Grossly normal heart sounds. Respiratory: Respiratory distress with tachypnea and gasping respirations, positive accessory muscle use.  Crackles noted throughout.  Right IJ TDC in place. Gastrointestinal: Soft and nontender. No distention. Genitourinary: deferred Musculoskeletal: 2+ pitting edema to bilateral upper legs, no associated tenderness. Neurologic:  Normal speech and language. No gross focal neurologic deficits are appreciated. Skin:  Skin is warm, dry and intact. No rash noted. Psychiatric: Mood and affect are normal. Speech and behavior are normal.  ____________________________________________   LABS (all labs ordered are listed, but only abnormal results are  displayed)  Labs Reviewed  CBC WITH DIFFERENTIAL/PLATELET - Abnormal; Notable for the following components:      Result Value   WBC 13.8 (*)    RBC 3.16 (*)    Hemoglobin 10.2 (*)    HCT 32.9 (*)    MCV 104.1 (*)    RDW 17.3 (*)    Neutro Abs 10.9 (*)    Eosinophils Absolute 0.6 (*)    Abs Immature Granulocytes 0.14 (*)    All other components within normal limits  BASIC METABOLIC PANEL - Abnormal; Notable for the following components:   Glucose, Bld 113 (*)    BUN 68 (*)    Creatinine, Ser 6.53 (*)    Calcium 8.5 (*)    GFR calc non Af Amer 9 (*)    GFR calc Af Amer 11 (*)    Anion gap 16 (*)    All other components within normal limits  TROPONIN I (HIGH SENSITIVITY) - Abnormal; Notable for the following components:   Troponin I (High Sensitivity) 44 (*)    All other components within normal limits  TROPONIN I (HIGH SENSITIVITY) - Abnormal; Notable for the following components:   Troponin I (High Sensitivity) 59 (*)    All other components within normal limits  SARS CORONAVIRUS 2 (HOSPITAL ORDER, [place] LAB)  GLUCOSE, CAPILLARY   ____________________________________________  EKG  ED ECG REPORT I, Blake Divine, the attending physician, personally viewed and interpreted this ECG.   Date: 07/27/2019  EKG Time: 8:04  Rate: 136  Rhythm: sinus tachycardia  Axis: Normal  Intervals:none  ST&T Change: Anterolateral TWI, similar to prior    PROCEDURES  Procedure(s) performed (including Critical Care):  .Critical Care Performed by: Blake Divine, MD Authorized by: Blake Divine, MD   Critical care provider statement:    Critical care time (minutes):  45   Critical care time was exclusive of:  Separately billable procedures and treating other patients and teaching time   Critical care was necessary to treat or prevent imminent or life-threatening deterioration of the following conditions:  Respiratory failure and renal failure    Critical care was time spent personally by me on the following activities:  Discussions with consultants, evaluation of patient's response to treatment, examination of patient, ordering and performing treatments and interventions, ordering and review of laboratory studies, ordering and review  of radiographic studies, pulse oximetry, re-evaluation of patient's condition, obtaining history from patient or surrogate and review of old charts     ____________________________________________   INITIAL IMPRESSION / ASSESSMENT AND PLAN / ED COURSE       43 year old male with history of ESRD on HD presents to the ED in respiratory distress after missing his dialysis appointment 2 days ago.  Appears consistent with hypervolemia secondary to missed dialysis given his crackles and lower extremity edema, patient to be placed on BiPAP.  Nephrology was contacted for emergent dialysis.  Will check chest x-ray and labs, EKG.  Chest x-ray consistent with pulmonary edema, labs consistent with ESRD with no acute electrolyte abnormality.  Patient continues to be stable on BiPAP, case discussed with hospitalist, who accepts patient for admission.      ____________________________________________   FINAL CLINICAL IMPRESSION(S) / ED DIAGNOSES  Final diagnoses:  Hypervolemia, unspecified hypervolemia type  Acute respiratory failure with hypoxia ([place])  ESRD (end stage renal disease) [facility])     ED Discharge Orders    None       Note:  This document was prepared using Dragon voice recognition software and may include unintentional dictation errors.   Blake Divine, MD 07/27/19 1556

## 2019-07-27 NOTE — ED Triage Notes (Addendum)
Pt arrival via [facility] from home c/o SOB. Pt on dialysis and last treatment was on Thursday. Pt presents with shortness of breath, diaphoresis, and generalized edema.   EDP at bedside.   VS w/ EMS- O2 sat 70's RA to 100% Non-rebreather, HR 140, BP 192/129.

## 2019-07-28 ENCOUNTER — Other Ambulatory Visit (INDEPENDENT_AMBULATORY_CARE_PROVIDER_SITE_OTHER): Payer: Self-pay | Admitting: Vascular Surgery

## 2019-07-28 ENCOUNTER — Encounter
Admission: EM | Disposition: A | Discharge status: Home Health | Payer: Self-pay | Source: Home / Self Care | Attending: Internal Medicine

## 2019-07-28 DIAGNOSIS — N186 End stage renal disease: Secondary | ICD-10-CM

## 2019-07-28 DIAGNOSIS — T82898A Other specified complication of vascular prosthetic devices, implants and grafts, initial encounter: Secondary | ICD-10-CM

## 2019-07-28 DIAGNOSIS — Z992 Dependence on renal dialysis: Secondary | ICD-10-CM

## 2019-07-28 HISTORY — PX: PERIPHERAL VASCULAR THROMBECTOMY: CATH118306

## 2019-07-28 LAB — CBC
HCT: 26.9 % — ABNORMAL LOW (ref 39.0–52.0)
Hemoglobin: 8.7 g/dL — ABNORMAL LOW (ref 13.0–17.0)
MCH: 32 pg (ref 26.0–34.0)
MCHC: 32.3 g/dL (ref 30.0–36.0)
MCV: 98.9 fL (ref 80.0–100.0)
Platelets: 181 10*3/uL (ref 150–400)
RBC: 2.72 MIL/uL — ABNORMAL LOW (ref 4.22–5.81)
RDW: 17.3 % — ABNORMAL HIGH (ref 11.5–15.5)
WBC: 8.5 10*3/uL (ref 4.0–10.5)
nRBC: 0 % (ref 0.0–0.2)

## 2019-07-28 LAB — BASIC METABOLIC PANEL
Anion gap: 10 (ref 5–15)
BUN: 21 mg/dL — ABNORMAL HIGH (ref 6–20)
CO2: 28 mmol/L (ref 22–32)
Calcium: 8.2 mg/dL — ABNORMAL LOW (ref 8.9–10.3)
Chloride: 98 mmol/L (ref 98–111)
Creatinine, Ser: 2.9 mg/dL — ABNORMAL HIGH (ref 0.61–1.24)
GFR calc Af Amer: 29 mL/min — ABNORMAL LOW (ref >60)
GFR calc non Af Amer: 25 mL/min — ABNORMAL LOW (ref >60)
Glucose, Bld: 84 mg/dL (ref 70–99)
Potassium: 3.4 mmol/L — ABNORMAL LOW (ref 3.5–5.1)
Sodium: 136 mmol/L (ref 135–145)

## 2019-07-28 LAB — GLUCOSE, CAPILLARY: Glucose-Capillary: 96 mg/dL (ref 70–99)

## 2019-07-28 SURGERY — PERIPHERAL VASCULAR THROMBECTOMY
Anesthesia: Moderate Sedation | Laterality: Left

## 2019-07-28 MED ORDER — FENTANYL CITRATE (PF) 100 MCG/2ML IJ SOLN
INTRAMUSCULAR | Status: DC | PRN
  Administered 2019-07-28 (×2): 50 ug via INTRAVENOUS

## 2019-07-28 MED ORDER — FENTANYL CITRATE (PF) 100 MCG/2ML IJ SOLN
INTRAMUSCULAR | Status: AC
  Filled 2019-07-28: qty 2

## 2019-07-28 MED ORDER — MIDAZOLAM HCL 5 MG/5ML IJ SOLN
INTRAMUSCULAR | Status: AC
  Filled 2019-07-28: qty 5

## 2019-07-28 MED ORDER — HEPARIN SODIUM (PORCINE) 1000 UNIT/ML IJ SOLN
INTRAMUSCULAR | Status: AC
  Filled 2019-07-28: qty 1

## 2019-07-28 MED ORDER — MIDAZOLAM HCL 2 MG/2ML IJ SOLN
INTRAMUSCULAR | Status: DC | PRN
  Administered 2019-07-28 (×2): 1 mg via INTRAVENOUS

## 2019-07-28 MED ORDER — IODIXANOL 320 MG/ML IV SOLN
INTRAVENOUS | Status: DC | PRN
  Administered 2019-07-28: 30 mL via INTRAVENOUS

## 2019-07-28 MED ORDER — MIDAZOLAM HCL 2 MG/ML PO SYRP: 8.0000 mg | ORAL_SOLUTION | Freq: Once | ORAL | Status: DC | PRN

## 2019-07-28 MED ORDER — HYDROMORPHONE HCL 1 MG/ML IJ SOLN: 1.0000 mg | Freq: Once | INTRAMUSCULAR | Status: DC | PRN

## 2019-07-28 MED ORDER — CEFAZOLIN SODIUM-DEXTROSE 1-4 GM/50ML-% IV SOLN
INTRAVENOUS | Status: AC
  Administered 2019-07-28: 1 g via INTRAVENOUS
  Filled 2019-07-28: qty 50

## 2019-07-28 MED ORDER — RENA-VITE PO TABS
1.0000 | ORAL_TABLET | Freq: Every day | ORAL | Status: DC
  Administered 2019-07-28: 1 via ORAL
  Filled 2019-07-28 (×2): qty 1

## 2019-07-28 MED ORDER — DIPHENHYDRAMINE HCL 50 MG/ML IJ SOLN: 50.0000 mg | Freq: Once | INTRAMUSCULAR | Status: DC | PRN

## 2019-07-28 MED ORDER — CEFAZOLIN SODIUM-DEXTROSE 1-4 GM/50ML-% IV SOLN
1.0000 g | Freq: Once | INTRAVENOUS | Status: AC
  Administered 2019-07-28: 15:00:00 1 g via INTRAVENOUS

## 2019-07-28 MED ORDER — ONDANSETRON HCL 4 MG/2ML IJ SOLN: 4.0000 mg | Freq: Four times a day (QID) | INTRAMUSCULAR | Status: DC | PRN

## 2019-07-28 MED ORDER — SODIUM CHLORIDE 0.9 % IV SOLN
INTRAVENOUS | Status: DC
  Administered 2019-07-28: 14:00:00 via INTRAVENOUS

## 2019-07-28 MED ORDER — METHYLPREDNISOLONE SODIUM SUCC 125 MG IJ SOLR: 125.0000 mg | Freq: Once | INTRAMUSCULAR | Status: DC | PRN

## 2019-07-28 MED ORDER — FAMOTIDINE 20 MG PO TABS: 40.0000 mg | ORAL_TABLET | Freq: Once | ORAL | Status: DC | PRN

## 2019-07-28 SURGICAL SUPPLY — 12 items
CATH BEACON 5 .035 40 KMP TP (CATHETERS) IMPLANT
CATH BEACON 5 .038 40 KMP TP (CATHETERS) ×2
DRAPE BRACHIAL (DRAPES) ×2 IMPLANT
GUIDEWIRE ANGLED .035 180CM (WIRE) ×2 IMPLANT
NDL ENTRY 21GA 7CM ECHOTIP (NEEDLE) IMPLANT
NEEDLE ENTRY 21GA 7CM ECHOTIP (NEEDLE) ×3 IMPLANT
PACK ANGIOGRAPHY (CUSTOM PROCEDURE TRAY) ×3 IMPLANT
SET INTRO CAPELLA COAXIAL (SET/KITS/TRAYS/PACK) ×2 IMPLANT
SHEATH BRITE TIP 6FRX5.5 (SHEATH) ×4 IMPLANT
SYR MEDRAD MARK 7 150ML (SYRINGE) ×2 IMPLANT
TUBING CONTRAST HIGH PRESS 72 (TUBING) ×3 IMPLANT
WIRE J 3MM .035X145CM (WIRE) ×3 IMPLANT

## 2019-07-28 NOTE — Progress Notes (Signed)
[facility], [place] 07/28/19  Subjective:   LOS: 1 10/05 0701 - 10/06 0700 In: -  Out: 3000  Patient missed his dialysis on Saturday due to lack of ride Urgent hemodialysis performed on Monday.  3 L of fluid was removed Patient seen during hemodialysis today.  Tolerating well AV fistula is clotted   HEMODIALYSIS FLOWSHEET:  Blood Flow Rate (mL/min): 400 mL/min Arterial Pressure (mmHg): -160 mmHg Venous Pressure (mmHg): 120 mmHg Transmembrane Pressure (mmHg): 50 mmHg Ultrafiltration Rate (mL/min): 670 mL/min Dialysate Flow Rate (mL/min): 600 ml/min Conductivity: Machine : 14 Conductivity: Machine : 14 Dialysis Fluid Bolus: Normal Saline Bolus Amount (mL): 250 mL    Objective:  Vital signs in last 24 hours:  Temp:  [97.9 F (36.6 C)-98.5 F (36.9 C)] 98.1 F (36.7 C) (10/06 0915) Pulse Rate:  [96-107] 107 (10/06 1000) Resp:  [8-29] 14 (10/06 1000) BP: (119-153)/(70-110) 122/95 (10/06 1000) SpO2:  [88 %-100 %] 94 % (10/06 1000) FiO2 (%):  [50 %] 50 % (10/05 1228) Weight:  [48.5 kg-49 kg] 48.5 kg (10/06 0500)  Weight change:  Filed Weights   07/27/19 0807 07/27/19 1228 07/28/19 0500  Weight: 46.8 kg 49 kg 48.5 kg    Intake/Output:    Intake/Output Summary (Last 24 hours) at 07/28/2019 1048 Last data filed at 07/27/2019 2004 Gross per 24 hour  Intake -  Output 3000 ml  Net -3000 ml     Physical Exam: General:  Ill-appearing, laying in the bed  HEENT  moist oral mucous membranes,   Pulm/lungs  clear to auscultation today  CVS/Heart  regular, no rub  Abdomen:   Soft, nontender  Extremities:  Trace edema  Neurologic:  Alert, able to answer questions  Skin:  Warm, dry  Access:  Right IJ PermCath       Basic Metabolic Panel:  Recent Labs  Lab 07/21/19 1453 07/23/19 0423 07/27/19 0941 07/28/19 0455  NA 139 141 136 136  K 3.9 4.6 5.0 3.4*  CL 105 102 98 98  CO2 27 27 22 28   GLUCOSE 96 83 113* 84  BUN 33* 39* 68* 21*   CREATININE 4.14* 4.48* 6.53* 2.90*  CALCIUM 8.6* 8.5* 8.5* 8.2*     CBC: Recent Labs  Lab 07/21/19 1453 07/23/19 0423 07/27/19 0815 07/28/19 0455  WBC 3.9* 7.9 13.8* 8.5  NEUTROABS  --   --  10.9*  --   HGB 8.2* 8.5* 10.2* 8.7*  HCT 26.1* 26.9* 32.9* 26.9*  MCV 103.2* 101.1* 104.1* 98.9  PLT 153 160 191 181      Lab Results  Component Value Date   HEPBSAG Negative 07/02/2019   HEPBSAB Non Reactive 12/25/2018   HEPBIGM Negative 05/19/2019      Microbiology:  Recent Results (from the past 240 hour(s))  SARS Coronavirus 2 [facility] order, Performed in [facility] hospital lab) Nasopharyngeal Nasopharyngeal Swab     Status: None   Collection Time: 07/20/19 10:29 AM   Specimen: Nasopharyngeal Swab  Result Value Ref Range Status   SARS Coronavirus 2 NEGATIVE NEGATIVE Final    Comment: (NOTE) If result is NEGATIVE SARS-CoV-2 target nucleic acids are NOT DETECTED. The SARS-CoV-2 RNA is generally detectable in upper and lower  respiratory specimens during the acute phase of infection. The lowest  concentration of SARS-CoV-2 viral copies this assay can detect is 250  copies / mL. A negative result does not preclude SARS-CoV-2 infection  and should not be used as the sole basis for treatment or  other  patient management decisions.  A negative result may occur with  improper specimen collection / handling, submission of specimen other  than nasopharyngeal swab, presence of viral mutation(s) within the  areas targeted by this assay, and inadequate number of viral copies  (<250 copies / mL). A negative result must be combined with clinical  observations, patient history, and epidemiological information. If result is POSITIVE SARS-CoV-2 target nucleic acids are DETECTED. The SARS-CoV-2 RNA is generally detectable in upper and lower  respiratory specimens dur ing the acute phase of infection.  Positive  results are indicative of active infection with SARS-CoV-2.  Clinical   correlation with patient history and other diagnostic information is  necessary to determine patient infection status.  Positive results do  not rule out bacterial infection or co-infection with other viruses. If result is PRESUMPTIVE POSTIVE SARS-CoV-2 nucleic acids MAY BE PRESENT.   A presumptive positive result was obtained on the submitted specimen  and confirmed on repeat testing.  While 2019 novel coronavirus  (SARS-CoV-2) nucleic acids may be present in the submitted sample  additional confirmatory testing may be necessary for epidemiological  and / or clinical management purposes  to differentiate between  SARS-CoV-2 and other Sarbecovirus currently known to infect humans.  If clinically indicated additional testing with an alternate test  methodology [phone]) is advised. The SARS-CoV-2 RNA is generally  detectable in upper and lower respiratory sp ecimens during the acute  phase of infection. The expected result is Negative. Fact Sheet for Patients:  StrictlyIdeas.no Fact Sheet for Healthcare Providers: BankingDealers.co.za This test is not yet approved or cleared by the [place] FDA and has been authorized for detection and/or diagnosis of SARS-CoV-2 by FDA under an Emergency Use Authorization (EUA).  This EUA will remain in effect (meaning this test can be used) for the duration of the COVID-19 declaration under Section 564(b)(1) of the Act, 21 U.S.C. section 360bbb-3(b)(1), unless the authorization is terminated or revoked sooner. Performed at [facility], [address] [place], [place] [address]   Body fluid culture     Status: None   Collection Time: 07/21/19  1:31 PM   Specimen: PATH Cytology Pleural fluid  Result Value Ref Range Status   Specimen Description   Final    PLEURAL Performed at [facility], [address], [place], [place] [address]    Special Requests   Final     NONE Performed at [facility], [address] [place], [place] [address]    Gram Stain   Final    FEW WBC PRESENT,BOTH PMN AND MONONUCLEAR NO ORGANISMS SEEN    Culture   Final    NO GROWTH 3 DAYS Performed at [facility], [address], [place], [place] [address]    Report Status 07/25/2019 FINAL  Final  SARS Coronavirus 2 [facility] order, Performed in [facility] hospital lab) Nasopharyngeal Nasopharyngeal Swab     Status: None   Collection Time: 07/27/19  8:16 AM   Specimen: Nasopharyngeal Swab  Result Value Ref Range Status   SARS Coronavirus 2 NEGATIVE NEGATIVE Final    Comment: (NOTE) If result is NEGATIVE SARS-CoV-2 target nucleic acids are NOT DETECTED. The SARS-CoV-2 RNA is generally detectable in upper and lower  respiratory specimens during the acute phase of infection. The lowest  concentration of SARS-CoV-2 viral copies this assay can detect is 250  copies / mL. A negative result does not preclude SARS-CoV-2 infection  and should not be used  as the sole basis for treatment or other  patient management decisions.  A negative result may occur with  improper specimen collection / handling, submission of specimen other  than nasopharyngeal swab, presence of viral mutation(s) within the  areas targeted by this assay, and inadequate number of viral copies  (<250 copies / mL). A negative result must be combined with clinical  observations, patient history, and epidemiological information. If result is POSITIVE SARS-CoV-2 target nucleic acids are DETECTED. The SARS-CoV-2 RNA is generally detectable in upper and lower  respiratory specimens dur ing the acute phase of infection.  Positive  results are indicative of active infection with SARS-CoV-2.  Clinical  correlation with patient history and other diagnostic information is  necessary to determine patient infection status.  Positive results do  not rule out bacterial infection or co-infection with other  viruses. If result is PRESUMPTIVE POSTIVE SARS-CoV-2 nucleic acids MAY BE PRESENT.   A presumptive positive result was obtained on the submitted specimen  and confirmed on repeat testing.  While 2019 novel coronavirus  (SARS-CoV-2) nucleic acids may be present in the submitted sample  additional confirmatory testing may be necessary for epidemiological  and / or clinical management purposes  to differentiate between  SARS-CoV-2 and other Sarbecovirus currently known to infect humans.  If clinically indicated additional testing with an alternate test  methodology [phone]) is advised. The SARS-CoV-2 RNA is generally  detectable in upper and lower respiratory sp ecimens during the acute  phase of infection. The expected result is Negative. Fact Sheet for Patients:  StrictlyIdeas.no Fact Sheet for Healthcare Providers: BankingDealers.co.za This test is not yet approved or cleared by the [place] FDA and has been authorized for detection and/or diagnosis of SARS-CoV-2 by FDA under an Emergency Use Authorization (EUA).  This EUA will remain in effect (meaning this test can be used) for the duration of the COVID-19 declaration under Section 564(b)(1) of the Act, 21 U.S.C. section 360bbb-3(b)(1), unless the authorization is terminated or revoked sooner. Performed at [facility], [address] [place], [place] [address]     Coagulation Studies: No results for input(s): LABPROT, INR in the last 72 hours.  Urinalysis: No results for input(s): COLORURINE, LABSPEC, PHURINE, GLUCOSEU, HGBUR, BILIRUBINUR, KETONESUR, PROTEINUR, UROBILINOGEN, NITRITE, LEUKOCYTESUR in the last 72 hours.  Invalid input(s): APPERANCEUR    Imaging: Dg Chest Portable 1 View  Result Date: 07/27/2019 CLINICAL DATA:  Pt complains of SOB. Pt on dialysis and last treatment was on Thursday. Pt presents with shortness of breath, diaphoresis, and  generalized edema. EXAM: PORTABLE CHEST 1 VIEW COMPARISON:  Chest radiograph 07/21/2019, 07/20/2019 FINDINGS: Stable cardiomediastinal contours with enlarged heart size. Unchanged position of a right central venous catheter. Central venous congestion. Increased bilateral predominantly lower lung interstitial opacities suspicious for edema. New opacification of the left lung base may reflect atelectasis, infiltrate not excluded. Possible small left pleural effusion. No pneumothorax. No acute finding in the visualized skeleton. IMPRESSION: 1. Increased bilateral predominantly lower lung interstitial opacities suspicious for pulmonary edema. 2. New opacification of the left lung base may reflect atelectasis, infiltrate not excluded. Electronically Signed   By: Audie Pinto M.D.   On: 07/27/2019 08:20     Medications:      Current Facility-Administered Medications (Cardiovascular):  .  amLODipine (NORVASC) tablet 10 mg .  atorvastatin (LIPITOR) tablet 80 mg .  carvedilol (COREG) tablet 25 mg .  hydrALAZINE (APRESOLINE) tablet 25 mg   Current Facility-Administered Medications (Respiratory):  .  albuterol (  PROVENTIL) (2.5 MG/3ML) 0.083% nebulizer solution 2.5 mg .  loratadine (CLARITIN) tablet 10 mg   Current Facility-Administered Medications (Analgesics):  .  acetaminophen (TYLENOL) tablet 650 mg **OR** acetaminophen (TYLENOL) suppository 650 mg .  aspirin EC tablet 81 mg .  oxyCODONE (Oxy IR/ROXICODONE) immediate release tablet 5 mg   Current Facility-Administered Medications (Hematological):  .  apixaban (ELIQUIS) tablet 2.5 mg   Current Facility-Administered Medications (Other):  Marland Kitchen  Chlorhexidine Gluconate Cloth 2 % PADS 6 each .  gabapentin (NEURONTIN) capsule 100 mg .  hydrOXYzine (ATARAX/VISTARIL) tablet 25 mg .  influenza vac split quadrivalent PF (FLUARIX) injection 0.5 mL .  ondansetron (ZOFRAN) tablet 4 mg **OR** ondansetron (ZOFRAN) injection 4 mg .  pneumococcal 23  valent vaccine (PNU-IMMUNE) injection 0.5 mL .  polyethylene glycol (MIRALAX / GLYCOLAX) packet 17 g .  sevelamer carbonate (RENVELA) tablet 1,600 mg .  sodium chloride flush (NS) 0.9 % injection 3 mL  No current outpatient medications on file.     Assessment/ Plan:  43 y.o. caucasian male with end-stage renal disease on hemodialysis, hypertension, peripheral vascular disease, depression.  He presents for acute onset of shortness of breath  Active Problems:   Pulmonary edema  [facility] [address] TTS 46.5 kg Rt IJ PC 240 min  #. ESRD   -Patient missed his dialysis treatment on Saturday due to lack of transportation -3 L of fluid removed during dialysis on Monday -Routine hemodialysis today with goal of 1 to 2 L as tolerated - will get vascular to evaluate clotted access - currently using permcath  #Volume overload with lower extremity edema and SOB -Fluid removal with hemodialysis as tolerated -Obtain postdialysis weight  #. Anemia of CKD  Lab Results  Component Value Date   HGB 8.7 (L) 07/28/2019  -Epogen with hemodialysis  #. SHPTH     Component Value Date/Time   PTH 336 (H) 02/04/2019 1619   Lab Results  Component Value Date   PHOS 3.9 07/12/2019  -Low phosphorus diet    LOS: [place] 10/6/202010:48 AM  [facility] [place], [place]

## 2019-07-28 NOTE — Progress Notes (Signed)
This note also relates to the following rows which could not be included: Pulse Rate - Cannot attach notes to unvalidated device data Resp - Cannot attach notes to unvalidated device data BP - Cannot attach notes to unvalidated device data    07/28/19 1230  Vital Signs  Pulse Rate Source Monitor  HD TX COMPLETED VITALS STABLE NO C/OS CVC WDL UFG 1730ML TOLERATED WELL

## 2019-07-28 NOTE — Progress Notes (Signed)
   07/28/19 0920  Vital Signs  Pulse Rate Source Monitor  BP 132/85  Oxygen Therapy  SpO2 95 %  O2 Device Room Air  Pulse Oximetry Type Continuous  During Hemodialysis Assessment  Blood Flow Rate (mL/min) 400 mL/min  Arterial Pressure (mmHg) -170 mmHg  Venous Pressure (mmHg) 120 mmHg  Transmembrane Pressure (mmHg) 50 mmHg  Ultrafiltration Rate (mL/min) 670 mL/min  Dialysate Flow Rate (mL/min) 600 ml/min  Conductivity: Machine  14  HD Safety Checks Performed Yes  Dialysis Fluid Bolus Normal Saline  Bolus Amount (mL) 250 mL  Intra-Hemodialysis Comments Tx initiated;Tolerated well  pt arrived to unit no c/os no distressed noted.. Vitals:   07/28/19 0915 07/28/19 0920  BP: 136/84 (P) 132/85  Pulse: 100   Resp: 16   Temp: 98.1 F (36.7 C)   SpO2: 95% 95%   stable cvc WDL UFG 1.5L HD in bed.

## 2019-07-28 NOTE — Progress Notes (Addendum)
[place] [facility] [place] NAME: Brett Small    MR#:  [id number]  DATE OF BIRTH:  02/28/1976  SUBJECTIVE:  CHIEF COMPLAINT:   Chief Complaint  Patient presents with  . Shortness of Breath   -Was here in the hospital last week for pulmonary edema, discharged, missed dialysis on Saturday.  Comes with pulmonary edema again.  Had emergency dialysis yesterday.  For dialysis again today per schedule -Complains of dyspnea and bilateral leg pain and stiffness  REVIEW OF SYSTEMS:  Review of Systems  Constitutional: Negative for chills, fever and malaise/fatigue.  HENT: Negative for congestion, ear discharge, hearing loss and nosebleeds.   Eyes: Negative for blurred vision and double vision.  Respiratory: Positive for shortness of breath. Negative for cough and wheezing.   Cardiovascular: Positive for orthopnea. Negative for chest pain, palpitations and leg swelling.  Gastrointestinal: Negative for abdominal pain, constipation, diarrhea, nausea and vomiting.  Genitourinary: Negative for dysuria.  Musculoskeletal: Positive for myalgias.  Neurological: Negative for dizziness, focal weakness, seizures, weakness and headaches.  Psychiatric/Behavioral: Negative for depression.    DRUG ALLERGIES:   Allergies  Allergen Reactions  . Codeine Nausea Only    Patient questioned this (??)  . Sulfa Antibiotics Hives and Nausea And Vomiting    VITALS:  Blood pressure 113/77, pulse 96, temperature 98.1 F (36.7 C), temperature source Oral, resp. rate (!) 22, height 5\' 1"  (1.549 m), weight 48.5 kg, SpO2 96 %.  PHYSICAL EXAMINATION:  Physical Exam   GENERAL:  43 y.o.-year-old patient lying in the bed with no acute distress.  EYES: Pupils equal, round, reactive to light and accommodation. No scleral icterus. Extraocular muscles intact.  HEENT: Head atraumatic, normocephalic. Oropharynx and nasopharynx clear.  NECK:  Supple, no jugular venous distention.  No thyroid enlargement, no tenderness.  LUNGS: Normal breath sounds bilaterally, no wheezing, rales,rhonchi or crepitation. No use of accessory muscles of respiration.  Fine bibasilar crackles. CARDIOVASCULAR: S1, S2 normal. No   rubs, or gallops.  2/6 systolic murmur is present ABDOMEN: Soft, nontender, nondistended. Bowel sounds present. No organomegaly or mass.  EXTREMITIES: No pedal edema, cyanosis, or clubbing.  Left forearm AV fistula with no thrill, -Right chest permacath in place NEUROLOGIC: Cranial nerves II through XII are intact. Muscle strength equal in all extremities. Sensation intact. Gait not checked.  PSYCHIATRIC: The patient is alert and oriented x 3.  SKIN: No obvious rash, lesion, or ulcer.    LABORATORY PANEL:   CBC Recent Labs  Lab 07/28/19 0455  WBC 8.5  HGB 8.7*  HCT 26.9*  PLT 181   ------------------------------------------------------------------------------------------------------------------  Chemistries  Recent Labs  Lab 07/28/19 0455  NA 136  K 3.4*  CL 98  CO2 28  GLUCOSE 84  BUN 21*  CREATININE 2.90*  CALCIUM 8.2*   ------------------------------------------------------------------------------------------------------------------  Cardiac Enzymes No results for input(s): TROPONINI in the last 168 hours. ------------------------------------------------------------------------------------------------------------------  RADIOLOGY:  Dg Chest Portable 1 View  Result Date: 07/27/2019 CLINICAL DATA:  Pt complains of SOB. Pt on dialysis and last treatment was on Thursday. Pt presents with shortness of breath, diaphoresis, and generalized edema. EXAM: PORTABLE CHEST 1 VIEW COMPARISON:  Chest radiograph 07/21/2019, 07/20/2019 FINDINGS: Stable cardiomediastinal contours with enlarged heart size. Unchanged position of a right central venous catheter. Central venous congestion. Increased bilateral predominantly lower lung interstitial opacities  suspicious for edema. New opacification of the left lung base may reflect atelectasis, infiltrate not excluded. Possible small left pleural effusion. No pneumothorax. No  acute finding in the visualized skeleton. IMPRESSION: 1. Increased bilateral predominantly lower lung interstitial opacities suspicious for pulmonary edema. 2. New opacification of the left lung base may reflect atelectasis, infiltrate not excluded. Electronically Signed   By: Audie Pinto M.D.   On: 07/27/2019 08:20    EKG:   Orders placed or performed during the hospital encounter of 07/27/19  . ED EKG  . ED EKG  . EKG 12-Lead  . EKG 12-Lead    ASSESSMENT AND PLAN:   43 year old male with past medical history significant for end-stage renal disease on Tuesday Thursday Saturday hemodialysis, secondary hyperparathyroidism, PAD, presents from home secondary to worsening shortness of breath and noted to have acute pulmonary edema.  1.  Acute pulmonary edema-very to fluid overload from missing hemodialysis. -Required BiPAP in the emergency room.  Currently weaned off BiPAP and on room air -Received emergency hemodialysis yesterday.  Still dyspneic and has some bibasilar crackles.  For dialysis today again per schedule.  2.  End-stage renal disease on Tuesday Thursday Saturday hemodialysis-missed last hemodialysis on Saturday.  Had emergent hemodialysis on admission yesterday. -For hemodialysis today per schedule. -Appreciate nephrology consult  3.  Peripheral arterial disease- on eliquis -Patient might get a fistulogram done today by vascular team  4. HTN- coreg, hydralazine  5.  DVT prophylaxis-already on Eliquis  Independent at baseline.  We will get physical therapy consult if needed   All the records are reviewed and case discussed with Care Management/Social Workerr. Management plans discussed with the patient, family and they are in agreement.  CODE STATUS: Full code  TOTAL TIME TAKING CARE OF THIS  PATIENT: 38 minutes.   POSSIBLE D/C IN 1-2 DAYS, DEPENDING ON CLINICAL CONDITION.   Gladstone Lighter M.D on 07/28/2019 at 12:09 PM  Between 7am to 6pm - Pager - [phone]  After 6pm go to [email or web address] - password [email or web address] [facility]  Office  [phone]  CC: Primary care physician; Patient, [name]

## 2019-07-28 NOTE — TOC Initial Note (Signed)
Transition of Care [facility]) - Initial/Assessment Note    Patient Details  Name: Brett Small MRN: [id number] Date of Birth: 12/27/1975  Transition of Care [facility]) CM/SW Contact:    Katrina Stack, RN Phone Number: 07/28/2019, 8:23 PM  Clinical Narrative:                Patient with high risk readmission score. He has had 6 admissions since July 2020.  He is followed by [employer] home health RN. Spoke with agency regarding measures to prevent admissions.  Agency stated agency will call patient on days there is no home visit so there will be daily contact. Patient missed a dialysis appointment last Saturday "due to transportation issues. [facility] provides transportation to dialysis on saturdays so it is unclear how transportation was the issues and patient can not elaborate. He appears to be keeping his appointments. Will add a Education officer, museum referral to home health to assist patient with community resources and possible depressive issues due to declining health. Patient had to have intervention today by vascular for fistula thrombus. [name] aware of admission. Patient is on room air.     Patient Goals and CMS Choice        Expected Discharge Plan and Services                                                Prior Living Arrangements/Services                       Activities of Daily Living Home Assistive Devices/Equipment: None ADL Screening (condition at time of admission) Patient's cognitive ability adequate to safely complete daily activities?: Yes Is the patient deaf or have difficulty hearing?: No Does the patient have difficulty seeing, even when wearing glasses/contacts?: No Does the patient have difficulty concentrating, remembering, or making decisions?: No Patient able to express need for assistance with ADLs?: Yes Does the patient have difficulty dressing or bathing?: No Independently performs ADLs?: Yes (appropriate for developmental age) Does the  patient have difficulty walking or climbing stairs?: No Weakness of Legs: None Weakness of Arms/Hands: None  Permission Sought/Granted                  Emotional Assessment              Admission diagnosis:  ESRD (end stage renal disease) ([place]) [N18.6] Acute respiratory failure with hypoxia ([place]) [J96.01] Hypervolemia, unspecified hypervolemia type [E87.70] Patient Active Problem List   Diagnosis Date Noted  . Pulmonary edema 07/27/2019  . Pleural effusion 07/20/2019  . NSTEMI (non-ST elevated myocardial infarction) ([place]) 07/09/2019  . Pneumonia 07/02/2019  . Acute liver failure 05/19/2019  . Hematemesis 05/19/2019  . Hepatitis   . Thrombocytopenia ([place])   . Coagulopathy ([place])   . Sepsis ([place]) 02/28/2019  . Lobar pneumonia ([place]) 02/28/2019  . Acute respiratory failure ([place]) 02/04/2019  . Acute respiratory failure with hypoxemia ([place]) 01/27/2019  . Depression 01/07/2019  . MDD (major depressive disorder), single episode, severe , no psychosis ([place])   . Homelessness   . Acute respiratory failure with hypoxia ([place]) 12/25/2018  . End stage renal disease on dialysis ([place]) 12/25/2018  . Hypertension 12/25/2018  . Renal osteodystrophy 12/25/2018   PCP:  Patient, [name] Pharmacy:   [place], [place]  [address] [place] [address] Phone: [phone] Fax: [phone]  [place], [place] - [place] [address] Phone: [phone] Fax: [phone]  [place], [place] - [address] [place] [address] Phone: [phone] Fax: [phone]     Social Determinants of Health ([place]) Interventions    Readmission Risk Interventions Readmission Risk Prevention Plan 07/28/2019 07/28/2019 07/23/2019  Transportation Screening Complete Complete Complete  Medication Review Press photographer) Complete  Complete Complete  PCP or Specialist appointment within 3-5 days of discharge Complete Complete (No Data)  PCP/Specialist Appt Not Complete comments - - -  HRI or Home Care Consult Complete Complete Complete  SW Recovery Care/Counseling Consult (No Data) - -  Palliative Care Screening Not Applicable Not Applicable Not [place] Not Applicable Not Applicable Not Applicable

## 2019-07-28 NOTE — Op Note (Signed)
OPERATIVE NOTE   PROCEDURE: 1. Contrast injection left brachial axillary AV graft  PRE-OPERATIVE DIAGNOSIS: Complication of dialysis access                                                       End Stage Renal Disease  POST-OPERATIVE DIAGNOSIS: same as above   SURGEON: Katha Cabal, M.D.  ANESTHESIA: Conscious sedation was administered under my direct supervision by the interventional radiology RN.  IV Versed plus fentanyl were utilized. Continuous ECG, pulse oximetry and blood pressure was monitored throughout the entire procedure.  Conscious sedation was for a total of 30 minutes.  ESTIMATED BLOOD LOSS: minimal  FINDING(S): 1. There appears to be a disruption of the arterial anastomosis with an associated pseudoaneurysm.  The graft itself demonstrates thrombus.  Central veins appear widely patent  SPECIMEN(S):  None  CONTRAST: 30 cc  FLUOROSCOPY TIME: 3.3 minutes  INDICATIONS: Brett Small is a 43 y.o. male who  presents with malfunctioning left arm AV access.  The patient is scheduled for angiography with possible intervention of the AV access to prevent loss of the permanent access.  The patient is aware the risks include but are not limited to: bleeding, infection, thrombosis of the cannulated access, and possible anaphylactic reaction to the contrast.  The patient acknowledges if the access can not be salvaged a tunneled catheter will be needed and will be placed during this procedure.  The patient is aware of the risks of the procedure and elects to proceed with the angiogram and intervention.  DESCRIPTION: After full informed written consent was obtained, the patient was brought back to the Special Procedure suite and placed supine position.  Appropriate cardiopulmonary monitors were placed.  The left arm was prepped and draped in the standard fashion.  Appropriate timeout is called.   The left brachial axillary access was cannulated with a micropuncture needle under  ultrasound guidence.  Ultrasound was used to evaluate the left arm graft.  It demonstrated heterogeneous material and was non-compressible indicating it is thrombosed.  An ultrasound image was acquired for the permanent record.  A micropuncture needle was used to access the graft in a retrograde direction near the level of the deltoid.  Access was obtained under direct ultrasound guidance.  The microwire was then advanced under fluoroscopic guidance without difficulty followed by the micro-sheath.  The J-wire was then advanced and a 6 Fr sheath inserted.  Floppy Glidewire and Kumpe catheter were then negotiated into the brachial artery.  Once the arterial images have been obtained the sheath was flipped with the help of the Glidewire into a antegrade orientation and the central veins were imaged.  Hand injections were completed to image the access from the arterial anastomosis through the entire access.  The central venous structures were also imaged by hand injections.  Based on the images, the graft is indeed thrombosed.  The central veins appear widely patent beyond the level of the previously placed via bond stents.  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  Visualized portions of the brachial artery are widely patent.  This will require surgical revision.Marland Kitchen    A 4-0 Monocryl purse-string suture was sewn around the sheath.  The sheath was removed and light pressure was applied.  A sterile bandage was applied  to the puncture site.    COMPLICATIONS: None  CONDITION: Brett Small, M.D [facility] Office: [phone]  07/28/2019 4:32 PM

## 2019-07-28 NOTE — Consult Note (Signed)
[facility] Vascular Consult Note  MRN : [id number]  Brett Small is a 43 y.o. ([birth year]) male who presents with chief complaint of  Chief Complaint  Patient presents with  . Shortness of Breath   History of Present Illness:  The patient is a 43 year old male with multiple medical issues (see below) with known history of end-stage renal disease requiring endovascular intervention by our service in the past.  Most recent intervention May 2020 (declot).  The patient is currently being maintained by a PermCath.  Patient presented to the [facility] emergency department via EMS from home with a chief complaint of shortness of breath.  Patient notes he had missed his Saturday dialysis treatment due to lack of ride.  Last dialysis was Thursday.  He presented complaining of shortness of breath, diaphoresis and generalized edema.  He notes that his left upper extremity dialysis access is not working.  Had right IJ PermCath placed which seems to be functioning at this time.  The patient underwent emergent dialysis on the day of admission.   Vascular surgery was consulted by Dr. Candiss Norse in regard to the patient's clotted access.  Current Facility-Administered Medications  Medication Dose Route Frequency Provider Last Rate Last Dose  . acetaminophen (TYLENOL) tablet 650 mg  650 mg Oral Q6H PRN Hillary Bow, MD       Or  . acetaminophen (TYLENOL) suppository 650 mg  650 mg Rectal Q6H PRN Sudini, Alveta Heimlich, MD      . albuterol (PROVENTIL) (2.5 MG/3ML) 0.083% nebulizer solution 2.5 mg  2.5 mg Nebulization Q2H PRN Hillary Bow, MD   2.5 mg at 07/28/19 0807  . amLODipine (NORVASC) tablet 10 mg  10 mg Oral Q2000 Hillary Bow, MD   10 mg at 07/27/19 2350  . apixaban (ELIQUIS) tablet 2.5 mg  2.5 mg Oral BID Hillary Bow, MD   2.5 mg at 07/28/19 0843  . aspirin EC tablet 81 mg  81 mg Oral Daily Hillary Bow, MD   81 mg at 07/28/19 0843  . atorvastatin  (LIPITOR) tablet 80 mg  80 mg Oral QHS Hillary Bow, MD   80 mg at 07/27/19 2349  . carvedilol (COREG) tablet 25 mg  25 mg Oral BID WC Hillary Bow, MD   25 mg at 07/28/19 1312  . Chlorhexidine Gluconate Cloth 2 % PADS 6 each  6 each Topical Q0600 Murlean Iba, MD   6 each at 07/28/19 0650  . gabapentin (NEURONTIN) capsule 100 mg  100 mg Oral TID Hillary Bow, MD   100 mg at 07/28/19 0843  . hydrALAZINE (APRESOLINE) tablet 25 mg  25 mg Oral Q8H Sudini, Srikar, MD   25 mg at 07/28/19 0530  . hydrOXYzine (ATARAX/VISTARIL) tablet 25 mg  25 mg Oral TID PRN Hillary Bow, MD      . influenza vac split quadrivalent PF (FLUARIX) injection 0.5 mL  0.5 mL Intramuscular Tomorrow-1000 Tyler Pita, MD      . loratadine (CLARITIN) tablet 10 mg  10 mg Oral Daily Hillary Bow, MD   10 mg at 07/28/19 0843  . ondansetron (ZOFRAN) tablet 4 mg  4 mg Oral Q6H PRN Hillary Bow, MD       Or  . ondansetron (ZOFRAN) injection 4 mg  4 mg Intravenous Q6H PRN Sudini, Alveta Heimlich, MD      . oxyCODONE (Oxy IR/ROXICODONE) immediate release tablet 5 mg  5 mg Oral Q4H PRN Hillary Bow, MD   5 mg at 07/28/19 0852  .  pneumococcal 23 valent vaccine (PNU-IMMUNE) injection 0.5 mL  0.5 mL Intramuscular Tomorrow-1000 Vernard Gambles L, MD      . polyethylene glycol (MIRALAX / GLYCOLAX) packet 17 g  17 g Oral Daily PRN Sudini, Alveta Heimlich, MD      . sevelamer carbonate (RENVELA) tablet 1,600 mg  1,600 mg Oral TID WC Hillary Bow, MD   1,600 mg at 07/28/19 0843  . sodium chloride flush (NS) 0.9 % injection 3 mL  3 mL Intravenous Q12H Hillary Bow, MD   3 mL at 07/28/19 0855   Past Medical History:  Diagnosis Date  . ESRD (end stage renal disease) ([place])   . Hypertension   . PAD (peripheral artery disease) ([facility])    Required aortofemoral stent-which had closed and had to redo the procedure and ischemia of limb.  . Peripheral vascular disease ([place])   . Renal disorder   . Secondary hyperparathyroidism of renal origin  [facility])    Past Surgical History:  Procedure Laterality Date  . A/V SHUNT INTERVENTION Left 01/19/2019   Procedure: LEFT UPPER EXTREMITY A/V SHUNTOGRAM / UPPER EXTREMITY ANGIOGRAM;  Surgeon: Algernon Huxley, MD;  Location: [facility];  Service: Cardiovascular;  Laterality: Left;  . A/V SHUNT INTERVENTION Left 03/02/2019   Procedure: A/V SHUNT INTERVENTION;  Surgeon: Algernon Huxley, MD;  Location: [facility];  Service: Cardiovascular;  Laterality: Left;  . AORTA - FEMORAL ARTERY BYPASS GRAFT    . AV FISTULA PLACEMENT Left 12/26/2018   Procedure: INSERTION OF GORE STRETCH VASCULAR 4-7MM X  45CM IN LEFT UPPER ARM;  Surgeon: Marty Heck, MD;  Location: [facility];  Service: Vascular;  Laterality: Left;  . DIALYSIS/PERMA CATHETER REMOVAL N/A 02/06/2019   Procedure: DIALYSIS/PERMA CATHETER REMOVAL;  Surgeon: Algernon Huxley, MD;  Location: [place] [facility];  Service: Cardiovascular;  Laterality: N/A;  . LEFT HEART CATH AND CORONARY ANGIOGRAPHY Right 07/10/2019   Procedure: LEFT HEART CATH AND CORONARY ANGIOGRAPHY;  Surgeon: Dionisio David, MD;  Location: [facility];  Service: Cardiovascular;  Laterality: Right;   Social History Social History   Tobacco Use  . Smoking status: Current Every Day Smoker    Packs/day: 0.50    Years: 30.00    Pack years: 15.00    Types: Cigarettes    Last attempt to quit: 06/26/2018    Years since quitting: 1.0  . Smokeless tobacco: Never Used  . Tobacco comment: smoked for 30 years   Substance Use Topics  . Alcohol use: Not Currently  . Drug use: Yes    Frequency: 1.0 times per week    Types: Marijuana    Comment: Pt states "maybe once or twice a week".    Family History Family History  Problem Relation Age of Onset  . Hypertension Other   . Diabetes Other   . Clotting disorder Father   Patient denies family history of peripheral artery disease, venous disease or renal disease.  Allergies  Allergen Reactions  . Codeine Nausea  Only    Patient questioned this (??)  . Sulfa Antibiotics Hives and Nausea And Vomiting   REVIEW OF SYSTEMS (Negative unless checked)  Constitutional: [] Weight loss  [] Fever  [] Chills Cardiac: [] Chest pain   [] Chest pressure   [] Palpitations   [x] Shortness of breath when laying flat   [x] Shortness of breath at rest   [x] Shortness of breath with exertion. Vascular:  [] Pain in legs with walking   [] Pain in legs at rest   [] Pain  in legs when laying flat   [] Claudication   [] Pain in feet when walking  [] Pain in feet at rest  [] Pain in feet when laying flat   [] History of DVT   [] Phlebitis   [x] Swelling in legs   [] Varicose veins   [] Non-healing ulcers Pulmonary:   [] Uses home oxygen   [] Productive cough   [] Hemoptysis   [] Wheeze  [] COPD   [] Asthma Neurologic:  [] Dizziness  [] Blackouts   [] Seizures   [] History of stroke   [] History of TIA  [] Aphasia   [] Temporary blindness   [] Dysphagia   [] Weakness or numbness in arms   [] Weakness or numbness in legs Musculoskeletal:  [] Arthritis   [] Joint swelling   [] Joint pain   [] Low back pain Hematologic:  [] Easy bruising  [] Easy bleeding   [] Hypercoagulable state   [] Anemic  [] Hepatitis Gastrointestinal:  [] Blood in stool   [] Vomiting blood  [] Gastroesophageal reflux/heartburn   [] Difficulty swallowing. Genitourinary:  [x] Chronic kidney disease   [] Difficult urination  [] Frequent urination  [] Burning with urination   [] Blood in urine Skin:  [] Rashes   [] Ulcers   [] Wounds Psychological:  [] History of anxiety   []  History of major depression.  Physical Examination  Vitals:   07/28/19 1215 07/28/19 1220 07/28/19 1230 07/28/19 1305  BP: 110/73 (!) 126/91 (!) 127/95 (!) 128/91  Pulse: (!) 104 (!) 105 (!) 109 99  Resp: 19 19 17 19   Temp:  98.4 F (36.9 C)  98.6 F (37 C)  TempSrc:  Oral  Oral  SpO2: 96% 97%  94%  Weight:   44.4 kg   Height:       Body mass index is 18.5 kg/m. Gen:  WD/WN, NAD Head: [place]/AT, No temporalis wasting. Prominent temp pulse  not noted. Ear/Nose/Throat: Hearing grossly intact, nares w/o erythema or drainage, oropharynx w/o Erythema/Exudate Eyes: Sclera non-icteric, conjunctiva clear Neck: Trachea midline.  No JVD.  Chest: Right IJ permcath intact, clean and dry Pulmonary:  Good air movement, respirations not labored, equal bilaterally.  Cardiac: RRR, normal S1, S2. Vascular:  Vessel Right Left  Radial Palpable Palpable  Ulnar Palpable Palpable                               Left Upper Extremity: No bruit or thrill noted.  Extremities warm distally to fingers.  No wounds ulcers noted to hand.  Gastrointestinal: soft, non-tender/non-distended. No guarding/reflex.  Musculoskeletal: M/S 5/5 throughout.  Extremities without ischemic changes.  No deformity or atrophy. Mild edema. Neurologic: Sensation grossly intact in extremities.  Symmetrical.  Speech is fluent. Motor exam as listed above. Psychiatric: Judgment intact, Mood & affect appropriate for pt's clinical situation. Dermatologic: No rashes or ulcers noted.  No cellulitis or open wounds. Lymph : No Cervical, Axillary, or Inguinal lymphadenopathy.  CBC Lab Results  Component Value Date   WBC 8.5 07/28/2019   HGB 8.7 (L) 07/28/2019   HCT 26.9 (L) 07/28/2019   MCV 98.9 07/28/2019   PLT 181 07/28/2019   BMET    Component Value Date/Time   NA 136 07/28/2019 0455   K 3.4 (L) 07/28/2019 0455   CL 98 07/28/2019 0455   CO2 28 07/28/2019 0455   GLUCOSE 84 07/28/2019 0455   BUN 21 (H) 07/28/2019 0455   CREATININE 2.90 (H) 07/28/2019 0455   CALCIUM 8.2 (L) 07/28/2019 0455   GFRNONAA 25 (L) 07/28/2019 0455   GFRAA 29 (L) 07/28/2019 0455   Estimated Creatinine Clearance: 20.2 mL/min (  A) (by C-G formula based on SCr of 2.9 mg/dL (H)).  COAG Lab Results  Component Value Date   INR 1.2 07/20/2019   INR 1.4 (H) 07/09/2019   INR 1.5 (H) 07/02/2019   Radiology US Renal  Result Date: 07/20/2019 CLINICAL DATA:  Attempt to evaluate mass seen on  prior CT within the right kidney. EXAM: RENAL / URINARY TRACT ULTRASOUND COMPLETE COMPARISON:  CT a CAP July 20, 2019 FINDINGS: Right Kidney: Renal measurements: 6.6 x 2.9 x 3.9 cm = volume: 39 mL. Kidney is atrophic. Within the superior pole of the right kidney there is a 2.1 x 1.8 x 1.5 Cm heterogeneous masslike structure. No hydronephrosis. Left Kidney: Renal measurements: 6.1 x 2.9 x 2.4 cm = volume: 23 mL. Kidney is atrophic. No hydronephrosis. Bladder: Appears normal for degree of bladder distention. Bilateral pleural effusions.  Small volume ascites. IMPRESSION: Focal masslike area involving the superior pole of the right kidney. This is incompletely characterized on current examination. This may represent a true renal mass or potentially non atrophic renal parenchyma. In the nonacute setting, recommend further evaluation with pre and post contrast-enhanced CT or MRI for definitive characterization. Electronically Signed   By: Lovey Newcomer M.D.   On: 07/20/2019 18:43   Dg Chest Portable 1 View  Result Date: 07/27/2019 CLINICAL DATA:  Pt complains of SOB. Pt on dialysis and last treatment was on Thursday. Pt presents with shortness of breath, diaphoresis, and generalized edema. EXAM: PORTABLE CHEST 1 VIEW COMPARISON:  Chest radiograph 07/21/2019, 07/20/2019 FINDINGS: Stable cardiomediastinal contours with enlarged heart size. Unchanged position of a right central venous catheter. Central venous congestion. Increased bilateral predominantly lower lung interstitial opacities suspicious for edema. New opacification of the left lung base may reflect atelectasis, infiltrate not excluded. Possible small left pleural effusion. No pneumothorax. No acute finding in the visualized skeleton. IMPRESSION: 1. Increased bilateral predominantly lower lung interstitial opacities suspicious for pulmonary edema. 2. New opacification of the left lung base may reflect atelectasis, infiltrate not excluded. Electronically  Signed   By: Audie Pinto M.D.   On: 07/27/2019 08:20   Dg Chest Port 1 View  Result Date: 07/21/2019 CLINICAL DATA:  Status post left thoracentesis. EXAM: PORTABLE CHEST 1 VIEW COMPARISON:  Chest x-ray 07/20/2019. FINDINGS: Dialysis catheter noted stable position. Cardiomegaly with mild bilateral interstitial prominence again noted. Findings suggest CHF. Pneumonitis cannot be excluded. Previous identified large left pleural effusion has been removed by thoracentesis. No pneumothorax post thoracentesis. Left upper extremity vascular stent graft noted. IMPRESSION: 1. Left thoracentesis with significant clearing of pleural effusions. No evidence of pneumothorax post thoracentesis. 2.  Dialysis catheter stable position. 3. Cardiomegaly with bilateral interstitial prominence again noted. Findings suggest CHF. Electronically Signed   By: Marcello Moores  Register   On: 07/21/2019 14:20   Dg Chest Portable 1 View  Result Date: 07/20/2019 CLINICAL DATA:  Chest pain. EXAM: PORTABLE CHEST 1 VIEW COMPARISON:  07/09/2019 FINDINGS: There is a moderate to large left-sided pleural effusion with adjacent compressive atelectasis. There is persistent cardiomegaly. There are scattered airspace opacities throughout the right lung zone which have improved from prior study. There is no pneumothorax. There is a trace right-sided pleural effusion. There is a well-positioned tunneled right-sided dialysis catheter. A vascular stent projects over the patient's axilla. IMPRESSION: 1. Moderate to large left-sided pleural effusion, increased in size from prior study. 2. Improving airspace opacity throughout the right lung field. 3. Well-positioned tunneled dialysis catheter. Electronically Signed   By: Jamie Kato.D.  On: 07/20/2019 10:34   Dg Chest Portable 1 View  Result Date: 07/09/2019 CLINICAL DATA:  43 year old male with shortness of breath. EXAM: PORTABLE CHEST 1 VIEW COMPARISON:  Portable chest 07/02/2019 and earlier.  FINDINGS: Portable AP upright view at 0131 hours. Stable cardiomegaly and mediastinal contours. Stable lung volumes. Stable right chest dialysis type catheter. New confluent but indistinct multilobar right lung opacity, similar left perihilar lower lung opacity. No pneumothorax. Left upper lobe pulmonary vascularity appears normal. Visualized tracheal air column is within normal limits. Left upper extremity vascular graft again noted. Stable blunting of the left costophrenic angle. Negative visible bowel gas pattern. IMPRESSION: 1. Acute confluent multilobar right lung opacity, lesser left perihilar opacity. Favor acute bilateral pneumonia over asymmetric pulmonary edema. 2. Underlying cardiomegaly, right chest dialysis type catheter. Electronically Signed   By: Genevie Ann M.D.   On: 07/09/2019 02:11   Dg Chest Port 1 View  Result Date: 07/02/2019 CLINICAL DATA:  Chest pain and increased shortness of breath prior to scheduled dialysis. EXAM: PORTABLE CHEST 1 VIEW COMPARISON:  03/02/2019 FINDINGS: Interval right jugular double-lumen catheter in satisfactory position. No pneumothorax. Enlarged cardiac silhouette with interval increase in size. Minimally prominent vasculature without significant change. Interval mild patchy opacity at the left lung base and possible minimal left pleural effusion. The previously demonstrated left axillary stent is again stroke demonstrated with an additional stent more inferiorly in the upper arm. Unremarkable bones. IMPRESSION: 1. Interval mild patchy atelectasis or pneumonia at the left lung base and possible minimal left pleural effusion. 2. Progressive cardiomegaly with stable minimal remain minimal pulmonary vascular congestion. Electronically Signed   By: Claudie Revering M.D.   On: 07/02/2019 13:13   Ct Angio Chest/abd/pel For Dissection W And/or Wo Contrast  Result Date: 07/20/2019 CLINICAL DATA:  Chest pain. Prior triple area EXAM: CT ANGIOGRAPHY CHEST, ABDOMEN AND PELVIS  TECHNIQUE: Multidetector CT imaging through the chest, abdomen and pelvis was performed using the standard protocol during bolus administration of intravenous contrast. Multiplanar reconstructed images and MIPs were obtained and reviewed to evaluate the vascular anatomy. CONTRAST:  45mL OMNIPAQUE IOHEXOL 350 MG/ML SOLN COMPARISON:  May 19, 2019 FINDINGS: CTA CHEST FINDINGS Cardiovascular: There is no large centrally located pulmonary embolism. The heart size is mildly enlarged. There is no aortic dissection. There is a tunneled dialysis catheter with appropriate positioning. The intracardiac blood pool is hypodense relative to the adjacent myocardium consistent with anemia. There is a small pericardial effusion. Mediastinum/Nodes: --there are enlarged mediastinal and hilar lymph nodes. --there are no pathologically enlarged axillary lymph nodes. --No supraclavicular lymphadenopathy. --Normal thyroid gland. --The esophagus is unremarkable Lungs/Pleura: There is a moderate right and large left pleural effusion. There is partial collapse of the left lower lobe. There are scattered airspace opacities in the right lower lobe. There is interlobular septal thickening bilaterally. There is no pneumothorax. There is debris within the upper trachea. Musculoskeletal: No chest wall abnormality. No acute or significant osseous findings. Review of the MIP images confirms the above findings. CTA ABDOMEN AND PELVIS FINDINGS VASCULAR Aorta: The thoracic aorta is patent without evidence for a dissection. There is an aortobifemoral bypass graft. There is some narrowing of the anterior stent. This degree of narrowing is similar to prior study. The limbs remain grossly patent. The native aorta is occluded. Celiac: The celiac axis is patent. The GDA is patent. SMA: There is a stent involving the proximal SMA. The stent is occluded. There is reconstitution distal to the stent. Renals: The native renal  arteries are atretic. There is  likely high-grade stenosis at the origin of the left renal artery. IMA: The proximal IMA is stenotic. Inflow: The iliac limbs of the bypass graft are widely patent. There is reconstitution of the left external iliac artery and left internal iliac arteries via retrograde flow. The visualized portions of the SFA bilaterally are patent. Veins: No obvious venous abnormality within the limitations of this arterial phase study. Review of the MIP images confirms the above findings. NON-VASCULAR Hepatobiliary: The liver is normal. Normal gallbladder.There is no biliary ductal dilation. Pancreas: Normal contours without ductal dilatation. No peripancreatic fluid collection. Spleen: There is a low-attenuation area within the spleen measuring approximately 2.1 cm. Adrenals/Urinary Tract: --Adrenal glands: No adrenal hemorrhage. --Right kidney/ureter: The native kidney is atrophic. There is a hyperdense area arising from the upper pole measuring approximately 1.5 cm. --Left kidney/ureter: The left kidney is atrophic. --Urinary bladder: Unremarkable. Stomach/Bowel: --Stomach/Duodenum: No hiatal hernia or other gastric abnormality. Normal duodenal course and caliber. --Small bowel: No dilatation or inflammation. --Colon: No focal abnormality. --Appendix: Normal. Lymphatic: Normal course and caliber of the major abdominal vessels. --No retroperitoneal lymphadenopathy. --No mesenteric lymphadenopathy. --No pelvic or inguinal lymphadenopathy. Reproductive: Unremarkable Other: There is a small volume of free fluid in the abdomen. There is no free air. The abdominal wall is normal. Musculoskeletal. 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 IMPRESSION: 1. No acute thoracic or abdominal aortic dissection. 2. Bilateral pleural effusions, left greater than right, with partial collapse of the left lower lobe. 3. Cardiomegaly with generalized volume overload. 4. Airspace opacities in the right  lower lobe concerning for pneumonia or aspiration. 5. Dialysis catheter with appropriate positioning. 6. Mediastinal and hilar adenopathy, likely reactive. 7. Patent aortobifemoral bypass graft. 8. There is a 1.5 cm hyperdense area arising from the upper pole of the right kidney. While this could represent a hemorrhagic/proteinaceous cyst, a solid mass is not excluded. Recommend further evaluation with ultrasound on a nonemergent basis. 9. There is a 2.1 cm low-attenuation area within the spleen. This could represent a splenic infarct. 10. Complete occlusion of the SMA stent. Per report, this was a finding present on the patient's outside CT dated 05/29/2019 from [facility] and therefore is chronic. 11. Small volume of free fluid in the abdomen pelvis. Aortic Atherosclerosis (ICD10-I70.0). Electronically Signed   By: Constance Holster M.D.   On: 07/20/2019 14:54   US Thoracentesis Asp Pleural Space W/img Guide  Result Date: 07/21/2019 INDICATION: Pleural effusion EXAM: ULTRASOUND GUIDED LEFT THORACENTESIS MEDICATIONS: None. COMPLICATIONS: None. PROCEDURE: An ultrasound guided thoracentesis was thoroughly discussed with the patient and questions answered. The benefits, risks, alternatives and complications were also discussed. The patient understands and wishes to proceed with the procedure. Written consent was obtained. Ultrasound was performed to localize and mark an adequate pocket of fluid in the left chest. The area was then prepped and draped in the normal sterile fashion. 1% Lidocaine was used for local anesthesia. Under ultrasound guidance a 6 French catheter was introduced. Thoracentesis was performed. The catheter was removed and a dressing applied. FINDINGS: A total of approximately 1.5 L of amber fluid was removed. Samples were sent to the laboratory as requested by the clinical team. IMPRESSION: Successful ultrasound guided left thoracentesis yielding 1.5 L of pleural fluid. Electronically  Signed   By: Marcello Moores  Register   On: 07/21/2019 14:07   Assessment/Plan The patient is a 43 year old male with multiple medical issues with  known history of end-stage renal disease requiring endovascular intervention by our service in the past.  Most recent intervention May 2020 (declot).  The patient is currently being maintained by a PermCath. 1. ESRD: Patient with known history of end-stage renal disease on chronic hemodialysis.  Patient with a clotted left upper extremity dialysis access.  Last intervention in May 2020.  Patient is currently being maintained by a PermCath.  Recommend declot of dialysis access and attempt to return function and possibly then remove PermCath.  Procedure, risks and benefits explained to the patient.  All questions answered.  The patient wishes to proceed. 2.  Tobacco abuse: We had a discussion for approximately three minutes regarding the absolute need for smoking cessation due to the deleterious nature of tobacco on the vascular system. We discussed the tobacco use would diminish patency of any intervention, and likely significantly worsen progressio of disease. We discussed multiple agents for quitting including replacement therapy or medications to reduce cravings such as Chantix. The patient voices their understanding of the importance of smoking cessation. 3.  Peripheral artery disease: At this time, the patient is asymptomatic.  On aspirin and statin for medical management  Discussed with Dr. Francene Castle, PA-C  07/28/2019 1:20 PM  This note was created with Dragon medical transcription system.  Any error is purely unintentional

## 2019-07-28 NOTE — H&P (Signed)
[facility] History & Physical Update  The patient was interviewed and re-examined.  The patient's previous History and Physical has been reviewed and is unchanged.  There is no change in the plan of care. We plan to proceed with the scheduled procedure.  [name], MD  07/28/2019, 3:19 PM

## 2019-07-29 ENCOUNTER — Encounter: Payer: Self-pay | Admitting: Vascular Surgery

## 2019-07-29 LAB — BASIC METABOLIC PANEL
Anion gap: 8 (ref 5–15)
BUN: 16 mg/dL (ref 6–20)
CO2: 28 mmol/L (ref 22–32)
Calcium: 8.5 mg/dL — ABNORMAL LOW (ref 8.9–10.3)
Chloride: 103 mmol/L (ref 98–111)
Creatinine, Ser: 2.55 mg/dL — ABNORMAL HIGH (ref 0.61–1.24)
GFR calc Af Amer: 34 mL/min — ABNORMAL LOW (ref >60)
GFR calc non Af Amer: 29 mL/min — ABNORMAL LOW (ref >60)
Glucose, Bld: 91 mg/dL (ref 70–99)
Potassium: 3.8 mmol/L (ref 3.5–5.1)
Sodium: 139 mmol/L (ref 135–145)

## 2019-07-29 MED ORDER — NEPRO/CARBSTEADY PO LIQD
237.0000 mL | Freq: Two times a day (BID) | ORAL | Status: DC
  Administered 2019-07-29: 237 mL via ORAL

## 2019-07-29 NOTE — Progress Notes (Signed)
Brett Small  A and O x 4. VSS. Pt tolerating diet well. No complaints of pain or nausea. IV removed intact, prescriptions given. Pt voiced understanding of discharge instructions with no further questions. Pt discharged via wheelchair with NT.   Allergies as of 07/29/2019      Reactions   Codeine Nausea Only   Patient questioned this (??)   Sulfa Antibiotics Hives, Nausea And Vomiting      Medication List    TAKE these medications   amLODipine 10 MG tablet Commonly known as: NORVASC Take 1 tablet (10 mg total) by mouth daily at 8 pm.   apixaban 2.5 MG Tabs tablet Commonly known as: ELIQUIS Take 1 tablet (2.5 mg total) by mouth 2 (two) times daily.   aspirin EC 81 MG tablet Take 1 tablet (81 mg total) by mouth daily.   atorvastatin 80 MG tablet Commonly known as: LIPITOR Take 1 tablet (80 mg total) by mouth at bedtime.   carvedilol 25 MG tablet Commonly known as: COREG Take 1 tablet (25 mg total) by mouth 2 (two) times daily with a meal.   feeding supplement (NEPRO CARB STEADY) Liqd Take 237 mLs by mouth 2 (two) times daily between meals.   gabapentin 100 MG capsule Commonly known as: NEURONTIN Take 1 capsule (100 mg total) by mouth 3 (three) times daily.   hydrALAZINE 25 MG tablet Commonly known as: APRESOLINE Take 1 tablet (25 mg total) by mouth every 8 (eight) hours.   hydrOXYzine 25 MG tablet Commonly known as: ATARAX/VISTARIL Take 25 mg by mouth 3 (three) times daily as needed for anxiety.   loratadine 10 MG tablet Commonly known as: CLARITIN Take 1 tablet (10 mg total) by mouth daily.   b complex-C-folic acid 1 MG capsule Take 1 capsule by mouth daily after supper. Notes to patient: 07/29/19   multivitamin Tabs tablet Take 1 tablet by mouth daily.   neomycin-bacitracin-polymyxin Oint Commonly known as: NEOSPORIN Apply 1 application topically 2 (two) times daily.   sevelamer carbonate 800 MG tablet Commonly known as: RENVELA Take 2 tablets (1,600 mg  total) by mouth 3 (three) times daily with meals.       Vitals:   07/29/19 0845 07/29/19 1205  BP:  110/85  Pulse: 99 92  Resp:  18  Temp:  (!) 97.5 F (36.4 C)  SpO2:  97%    Francesco Sor

## 2019-07-29 NOTE — Discharge Summary (Signed)
[place] [facility] [place] NAME: Detron Turvey    MR#:  [id number]  DATE OF BIRTH:  [birth year]  DATE OF ADMISSION:  07/27/2019   ADMITTING PHYSICIAN: Hillary Bow, MD  DATE OF DISCHARGE:  07/29/19  PRIMARY CARE PHYSICIAN: Patient, [name]   ADMISSION DIAGNOSIS:   ESRD (end stage renal disease) ([place]) [N18.6] Acute respiratory failure with hypoxia (HCC) [J96.01] Hypervolemia, unspecified hypervolemia type [E87.70]  DISCHARGE DIAGNOSIS:   Active Problems:   Pulmonary edema   SECONDARY DIAGNOSIS:   Past Medical History:  Diagnosis Date  . ESRD (end stage renal disease) ([place])   . Hypertension   . PAD (peripheral artery disease) (HCC)    Required aortofemoral stent-which had closed and had to redo the procedure and ischemia of limb.  . Peripheral vascular disease ([place])   . Renal disorder   . Secondary hyperparathyroidism of renal origin [facility])     HOSPITAL COURSE:   43 year old male with past medical history significant for end-stage renal disease on Tuesday Thursday Saturday hemodialysis, secondary hyperparathyroidism, PAD, presents from home secondary to worsening shortness of breath and noted to have acute pulmonary edema.  1.  Acute pulmonary edema-secondary to fluid overload from missing hemodialysis. -Required BiPAP in the emergency room.  Currently weaned off BiPAP and on room air -Received 2 dialysis sessions in the hospital with significant improvement. -Feels much better.  Will be discharged home today.  Strongly advised to go to his dialysis per schedule and recommended a low-sodium diet with fluid restriction.   2.  End-stage renal disease on Tuesday Thursday Saturday hemodialysis-missed last hemodialysis on Saturday.  Had emergent hemodialysis on admission and hemodialysis again on 07/28/2019 per schedule. -Appreciate nephrology consult -Dialysis again tomorrow as outpatient  3.  Peripheral arterial disease- on  eliquis -Status post fistulogram done this hospitalization showing thrombosed graft.  Vascular recommended outpatient follow-up for surgical revision.  Patient has a permacath in place.  4. HTN- coreg, norvasc, hydralazine   Independent at baseline. Discharge today  DISCHARGE CONDITIONS:   Guarded  CONSULTS OBTAINED:   Treatment Team:  Murlean Iba, MD Schnier, Dolores Lory, MD  DRUG ALLERGIES:   Allergies  Allergen Reactions  . Codeine Nausea Only    Patient questioned this (??)  . Sulfa Antibiotics Hives and Nausea And Vomiting   DISCHARGE MEDICATIONS:   Allergies as of 07/29/2019      Reactions   Codeine Nausea Only   Patient questioned this (??)   Sulfa Antibiotics Hives, Nausea And Vomiting      Medication List    TAKE these medications   amLODipine 10 MG tablet Commonly known as: NORVASC Take 1 tablet (10 mg total) by mouth daily at 8 pm.   apixaban 2.5 MG Tabs tablet Commonly known as: ELIQUIS Take 1 tablet (2.5 mg total) by mouth 2 (two) times daily.   aspirin EC 81 MG tablet Take 1 tablet (81 mg total) by mouth daily.   atorvastatin 80 MG tablet Commonly known as: LIPITOR Take 1 tablet (80 mg total) by mouth at bedtime.   carvedilol 25 MG tablet Commonly known as: COREG Take 1 tablet (25 mg total) by mouth 2 (two) times daily with a meal.   feeding supplement (NEPRO CARB STEADY) Liqd Take 237 mLs by mouth 2 (two) times daily between meals.   gabapentin 100 MG capsule Commonly known as: NEURONTIN Take 1 capsule (100 mg total) by mouth 3 (three) times daily.   hydrALAZINE 25  MG tablet Commonly known as: APRESOLINE Take 1 tablet (25 mg total) by mouth every 8 (eight) hours.   hydrOXYzine 25 MG tablet Commonly known as: ATARAX/VISTARIL Take 25 mg by mouth 3 (three) times daily as needed for anxiety.   loratadine 10 MG tablet Commonly known as: CLARITIN Take 1 tablet (10 mg total) by mouth daily.   b complex-C-folic acid 1 MG capsule  Take 1 capsule by mouth daily after supper. Notes to patient: 07/29/19   multivitamin Tabs tablet Take 1 tablet by mouth daily.   neomycin-bacitracin-polymyxin Oint Commonly known as: NEOSPORIN Apply 1 application topically 2 (two) times daily.   sevelamer carbonate 800 MG tablet Commonly known as: RENVELA Take 2 tablets (1,600 mg total) by mouth 3 (three) times daily with meals.        DISCHARGE INSTRUCTIONS:   1. For dialysis tomorrow per schedule 2. Vascular f/u in 2 weeks 3. PCP f/u in 1 week  DIET:   Renal  diet  ACTIVITY:   Activity as tolerated  OXYGEN:   Home Oxygen: No.  Oxygen Delivery: room air  DISCHARGE LOCATION:   home   If you experience worsening of your admission symptoms, develop shortness of breath, life threatening emergency, suicidal or homicidal thoughts you must seek medical attention immediately by calling 911 or calling your MD immediately  if symptoms less severe.  You Must read complete instructions/literature along with all the possible adverse reactions/side effects for all the Medicines you take and that have been prescribed to you. Take any new Medicines after you have completely understood and accpet all the possible adverse reactions/side effects.   Please note  You were cared for by a hospitalist during your hospital stay. If you have any questions about your discharge medications or the care you received while you were in the hospital after you are discharged, you can call the unit and asked to speak with the hospitalist on call if the hospitalist that took care of you is not available. Once you are discharged, your primary care physician will handle any further medical issues. Please note that NO REFILLS for any discharge medications will be authorized once you are discharged, as it is imperative that you return to your primary care physician (or establish a relationship with a primary care physician if you do not have one) for your  aftercare needs so that they can reassess your need for medications and monitor your lab values.    On the day of Discharge:  VITAL SIGNS:   Blood pressure 110/85, pulse 92, temperature (!) 97.5 F (36.4 C), temperature source Oral, resp. rate 18, height 5\' 1"  (1.549 m), weight 44.4 kg, SpO2 97 %.  PHYSICAL EXAMINATION:     GENERAL:  43 y.o.-year-old patient lying in the bed with no acute distress.  EYES: Pupils equal, round, reactive to light and accommodation. No scleral icterus. Extraocular muscles intact.  HEENT: Head atraumatic, normocephalic. Oropharynx and nasopharynx clear.  NECK:  Supple, no jugular venous distention. No thyroid enlargement, no tenderness.  LUNGS: Normal breath sounds bilaterally, no wheezing, rales,rhonchi or crepitation. No use of accessory muscles of respiration.  Fine bibasilar crackles. CARDIOVASCULAR: S1, S2 normal. No   rubs, or gallops.  2/6 systolic murmur is present ABDOMEN: Soft, nontender, nondistended. Bowel sounds present. No organomegaly or mass.  EXTREMITIES: No pedal edema, cyanosis, or clubbing.  Left forearm AV graft thrombosed, -Right chest permacath in place NEUROLOGIC: Cranial nerves II through XII are intact. Muscle strength equal in all extremities.  Sensation intact. Gait not checked.  PSYCHIATRIC: The patient is alert and oriented x 3.  SKIN: No obvious rash, lesion, or ulcer  DATA REVIEW:   CBC Recent Labs  Lab 07/28/19 0455  WBC 8.5  HGB 8.7*  HCT 26.9*  PLT 181    Chemistries  Recent Labs  Lab 07/29/19 0407  NA 139  K 3.8  CL 103  CO2 28  GLUCOSE 91  BUN 16  CREATININE 2.55*  CALCIUM 8.5*     Microbiology Results  Results for orders placed or performed during the hospital encounter of 07/27/19  SARS Coronavirus 2 [facility] order, Performed in [facility] hospital lab) Nasopharyngeal Nasopharyngeal Swab     Status: None   Collection Time: 07/27/19  8:16 AM   Specimen: Nasopharyngeal Swab  Result Value Ref  Range Status   SARS Coronavirus 2 NEGATIVE NEGATIVE Final    Comment: (NOTE) If result is NEGATIVE SARS-CoV-2 target nucleic acids are NOT DETECTED. The SARS-CoV-2 RNA is generally detectable in upper and lower  respiratory specimens during the acute phase of infection. The lowest  concentration of SARS-CoV-2 viral copies this assay can detect is 250  copies / mL. A negative result does not preclude SARS-CoV-2 infection  and should not be used as the sole basis for treatment or other  patient management decisions.  A negative result may occur with  improper specimen collection / handling, submission of specimen other  than nasopharyngeal swab, presence of viral mutation(s) within the  areas targeted by this assay, and inadequate number of viral copies  (<250 copies / mL). A negative result must be combined with clinical  observations, patient history, and epidemiological information. If result is POSITIVE SARS-CoV-2 target nucleic acids are DETECTED. The SARS-CoV-2 RNA is generally detectable in upper and lower  respiratory specimens dur ing the acute phase of infection.  Positive  results are indicative of active infection with SARS-CoV-2.  Clinical  correlation with patient history and other diagnostic information is  necessary to determine patient infection status.  Positive results do  not rule out bacterial infection or co-infection with other viruses. If result is PRESUMPTIVE POSTIVE SARS-CoV-2 nucleic acids MAY BE PRESENT.   A presumptive positive result was obtained on the submitted specimen  and confirmed on repeat testing.  While 2019 novel coronavirus  (SARS-CoV-2) nucleic acids may be present in the submitted sample  additional confirmatory testing may be necessary for epidemiological  and / or clinical management purposes  to differentiate between  SARS-CoV-2 and other Sarbecovirus currently known to infect humans.  If clinically indicated additional testing with an  alternate test  methodology [phone]) is advised. The SARS-CoV-2 RNA is generally  detectable in upper and lower respiratory sp ecimens during the acute  phase of infection. The expected result is Negative. Fact Sheet for Patients:  StrictlyIdeas.no Fact Sheet for Healthcare Providers: BankingDealers.co.za This test is not yet approved or cleared by the [place] FDA and has been authorized for detection and/or diagnosis of SARS-CoV-2 by FDA under an Emergency Use Authorization (EUA).  This EUA will remain in effect (meaning this test can be used) for the duration of the COVID-19 declaration under Section 564(b)(1) of the Act, 21 U.S.C. section 360bbb-3(b)(1), unless the authorization is terminated or revoked sooner. Performed at [facility], [address], [place], [place] [address]     RADIOLOGY:  No results found.   Management plans discussed with the patient, family and they are in agreement.  CODE STATUS:  Code Status Orders  (From admission, onward)         Start     Ordered   07/27/19 1110  Full code  Continuous     07/27/19 1110        Code Status History    Date Active Date Inactive Code Status Order ID Comments User Context   07/20/2019 2041 07/23/2019 1929 DNR [id number]  Vaughan Basta, MD Inpatient   07/09/2019 0543 07/12/2019 1502 Full Code [id number]  Harrie Foreman, MD Inpatient   07/02/2019 2128 07/06/2019 1557 Full Code [id number]  Henreitta Leber, MD Inpatient   05/19/2019 0603 05/22/2019 1957 Full Code [id number]  Lance Coon, MD Inpatient   02/28/2019 1805 03/03/2019 2234 Full Code [id number]  Vaughan Basta, MD Inpatient   02/21/2019 0835 02/24/2019 1945 Full Code [id number]  Harrie Foreman, MD Inpatient   02/04/2019 1343 02/09/2019 2050 Full Code [id number]  Saundra Shelling, MD ED   01/27/2019 0654 01/29/2019 2049 Partial Code [id number]  Harrie Foreman, MD Inpatient   01/07/2019  2156 01/27/2019 0638 Partial Code [id number]  Tennis Ship, MD Inpatient   12/25/2018 0158 01/07/2019 2027 Partial Code [id number]  Ina Homes, MD ED   Advance Care Planning Activity      TOTAL TIME TAKING CARE OF THIS PATIENT: 38 minutes.    Gladstone Lighter M.D on 07/29/2019 at 1:07 PM  Between 7am to 6pm - Pager - [phone]  After 6pm go to [email or web address] - Proofreader  [facility]  Office  [phone]  CC: Primary care physician; Patient, [name]   Note: This dictation was prepared with Dragon dictation along with smaller phrase technology. Any transcriptional errors that result from this process are unintentional.

## 2019-07-29 NOTE — Evaluation (Signed)
Physical Therapy Evaluation Patient Details Name: Brett Small MRN: [id number] DOB: [birth year] Today's Date: 07/29/2019   History of Present Illness  presented to ER secondary to SOB; admitted for management of pulmonary edema after missed dialysis session.  Clinical Impression  Upon evaluation, patient alert and oriented; follows commands and demonstrates good effort with mobility tasks. 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  Demonstrates ability to complete bed mobility indep; sit/stand with RW, mod indep; gait (25') without assist device, cga/close sup and gait (175') with RW, close sup. reciprocal stepping pattern with mild vaulting over L LE (due to baseline L ankle PF contracture); steady cadence, gait speed.  Does demonstrate optimal safety, indep and overall energy conservation with use of RW; do recommend continued use at this time. Would benefit from skilled PT to address above deficits and promote optimal return to PLOF; Recommend transition to [place] upon discharge from acute hospitalization.     Follow Up Recommendations Home health PT    Equipment Recommendations       Recommendations for Other Services       Precautions / Restrictions Precautions Precautions: Fall Precaution Comments: R perm cath, L AVF Restrictions Weight Bearing Restrictions: No      Mobility  Bed Mobility Overal bed mobility: Independent                Transfers Overall transfer level: Modified independent Equipment used: Rolling walker (2 wheeled) Transfers: Sit to/from Stand Sit to Stand: Modified independent (Device/Increase time)         General transfer comment: heavy use of UEs, but safe and confident  Ambulation/Gait Ambulation/Gait assistance:  Supervision;Modified independent (Device/Increase time) Gait Distance (Feet): 200 Feet Assistive device: Rolling walker (2 wheeled)       General Gait Details: reciprocal stepping pattern with mild vaulting over L LE (due to baseline L ankle PF contracture); steady cadence, gait speed.  Did trial short distance without RW, noted increase in energy expenditure, decrease in balance; do recommend continued use of RW at this time.  Stairs Stairs: (patient refused stairs)          Wheelchair Mobility    Modified Rankin (Stroke Patients Only)       Balance Overall balance assessment: Needs assistance Sitting-balance support: No upper extremity supported;Feet supported Sitting balance-Leahy Scale: Good     Standing balance support: Bilateral upper extremity supported Standing balance-Leahy Scale: Fair                               Pertinent Vitals/Pain Pain Assessment: No/denies pain    Home Living Family/patient expects to be discharged to:: Private residence(lives in boarding home with 6 other adults)     Type of Home: (boarding home) Home Access: Stairs to enter Entrance Stairs-Rails: None Entrance Stairs-Number of Steps: 6   Home Equipment: Walker - 2 wheels;Cane - single point      Prior Function Level of Independence: Independent with assistive device(s)         Comments: Mod indep with ADLs, household mobilization; does have RW if needed.  Pt takes transport to dialysis TTS, is not out of the home much otherwise.     Hand Dominance        Extremity/Trunk Assessment   Upper Extremity  Assessment Upper Extremity Assessment: Overall WFL for tasks assessed    Lower Extremity Assessment Lower Extremity Assessment: Overall WFL for tasks assessed(L ankle lacking 10-15 degrees DF (previous surgical intervention)')       Communication   Communication: No difficulties  Cognition Arousal/Alertness: Awake/alert Behavior During Therapy: WFL for  tasks assessed/performed Overall Cognitive Status: Within Functional Limits for tasks assessed                                        General Comments      Exercises     Assessment/Plan    PT Assessment Patient needs continued PT services  PT Problem List Decreased strength;Decreased range of motion;Decreased activity tolerance;Decreased balance;Decreased mobility;Decreased knowledge of use of DME;Decreased safety awareness;Cardiopulmonary status limiting activity;Impaired sensation       PT Treatment Interventions DME instruction;Gait training;Stair training;Functional mobility training;Therapeutic activities;Therapeutic exercise;Balance training;Neuromuscular re-education;Patient/family education    PT Goals (Current goals can be found in the Care Plan section)  Acute Rehab PT Goals Patient Stated Goal: to get out of the hospital PT Goal Formulation: With patient Time For Goal Achievement: 07/17/19 Potential to Achieve Goals: Good    Frequency Min 2X/week   Barriers to discharge        Co-evaluation               AM-PAC PT "6 Clicks" Mobility  Outcome Measure Help needed turning from your back to your side while in a flat bed without using bedrails?: None Help needed moving from lying on your back to sitting on the side of a flat bed without using bedrails?: None Help needed moving to and from a bed to a chair (including a wheelchair)?: None   Help needed to walk in hospital room?: A Little Help needed climbing 3-5 steps with a railing? : A Little 6 Click Score: 18    End of Session Equipment Utilized During Treatment: Gait belt Activity Tolerance: Patient tolerated treatment well Patient left: with bed alarm set;with call bell/phone within reach Nurse Communication: Mobility status PT Visit Diagnosis: Muscle weakness (generalized) (M62.81);Difficulty in walking, not elsewhere classified (R26.2)    Time: [id number] PT Time Calculation (min)  (ACUTE ONLY): 14 min   Charges:   PT Evaluation $PT Eval Moderate Complexity: 1 Mod     Gratia Disla H. Owens Shark, PT, DPT, NCS 07/29/19, 1:40 PM [phone]

## 2019-07-29 NOTE — Progress Notes (Signed)
[facility], [place] 07/29/19  Subjective:   LOS: 2 10/06 0701 - 10/07 0700 In: 0  Out: 37 [Urine:75] Patient missed his dialysis on Saturday due to lack of ride Urgent hemodialysis performed on Monday.  3 L of fluid was removed Patient feels well today Currently on room air   Objective:  Vital signs in last 24 hours:  Temp:  [97.5 F (36.4 C)-98.6 F (37 C)] 97.5 F (36.4 C) (10/07 0428) Pulse Rate:  [81-109] 99 (10/07 0845) Resp:  [9-22] 19 (10/07 0428) BP: (99-130)/(70-100) 126/79 (10/07 0844) SpO2:  [92 %-100 %] 99 % (10/07 0428) Weight:  [44.4 kg] 44.4 kg (10/06 1338)  Weight change: -2.4 kg Filed Weights   07/28/19 0500 07/28/19 1230 07/28/19 1338  Weight: 48.5 kg 44.4 kg 44.4 kg    Intake/Output:    Intake/Output Summary (Last 24 hours) at 07/29/2019 1143 Last data filed at 07/29/2019 1032 Gross per 24 hour  Intake 240 ml  Output 75 ml  Net 165 ml     Physical Exam: General:  Ill-appearing, laying in the bed  HEENT  moist oral mucous membranes,   Pulm/lungs  clear to auscultation today  CVS/Heart  regular, no rub  Abdomen:   Soft, nontender  Extremities:  Trace edema  Neurologic:  Alert, able to answer questions  Skin:  Warm, dry  Access:  Right IJ PermCath       Basic Metabolic Panel:  Recent Labs  Lab 07/23/19 0423 07/27/19 0941 07/28/19 0455 07/29/19 0407  NA 141 136 136 139  K 4.6 5.0 3.4* 3.8  CL 102 98 98 103  CO2 27 22 28 28   GLUCOSE 83 113* 84 91  BUN 39* 68* 21* 16  CREATININE 4.48* 6.53* 2.90* 2.55*  CALCIUM 8.5* 8.5* 8.2* 8.5*     CBC: Recent Labs  Lab 07/23/19 0423 07/27/19 0815 07/28/19 0455  WBC 7.9 13.8* 8.5  NEUTROABS  --  10.9*  --   HGB 8.5* 10.2* 8.7*  HCT 26.9* 32.9* 26.9*  MCV 101.1* 104.1* 98.9  PLT 160 191 181      Lab Results  Component Value Date   HEPBSAG Negative 07/02/2019   HEPBSAB Non Reactive 12/25/2018   HEPBIGM Negative 05/19/2019       Microbiology:  Recent Results (from the past 240 hour(s))  SARS Coronavirus 2 [facility] order, Performed in [facility] hospital lab) Nasopharyngeal Nasopharyngeal Swab     Status: None   Collection Time: 07/20/19 10:29 AM   Specimen: Nasopharyngeal Swab  Result Value Ref Range Status   SARS Coronavirus 2 NEGATIVE NEGATIVE Final    Comment: (NOTE) If result is NEGATIVE SARS-CoV-2 target nucleic acids are NOT DETECTED. The SARS-CoV-2 RNA is generally detectable in upper and lower  respiratory specimens during the acute phase of infection. The lowest  concentration of SARS-CoV-2 viral copies this assay can detect is 250  copies / mL. A negative result does not preclude SARS-CoV-2 infection  and should not be used as the sole basis for treatment or other  patient management decisions.  A negative result may occur with  improper specimen collection / handling, submission of specimen other  than nasopharyngeal swab, presence of viral mutation(s) within the  areas targeted by this assay, and inadequate number of viral copies  (<250 copies / mL). A negative result must be combined with clinical  observations, patient history, and epidemiological information. If result is POSITIVE SARS-CoV-2 target nucleic acids are DETECTED. The SARS-CoV-2 RNA is  generally detectable in upper and lower  respiratory specimens dur ing the acute phase of infection.  Positive  results are indicative of active infection with SARS-CoV-2.  Clinical  correlation with patient history and other diagnostic information is  necessary to determine patient infection status.  Positive results do  not rule out bacterial infection or co-infection with other viruses. If result is PRESUMPTIVE POSTIVE SARS-CoV-2 nucleic acids MAY BE PRESENT.   A presumptive positive result was obtained on the submitted specimen  and confirmed on repeat testing.  While 2019 novel coronavirus  (SARS-CoV-2) nucleic acids may be present  in the submitted sample  additional confirmatory testing may be necessary for epidemiological  and / or clinical management purposes  to differentiate between  SARS-CoV-2 and other Sarbecovirus currently known to infect humans.  If clinically indicated additional testing with an alternate test  methodology [phone]) is advised. The SARS-CoV-2 RNA is generally  detectable in upper and lower respiratory sp ecimens during the acute  phase of infection. The expected result is Negative. Fact Sheet for Patients:  StrictlyIdeas.no Fact Sheet for Healthcare Providers: BankingDealers.co.za This test is not yet approved or cleared by the [place] FDA and has been authorized for detection and/or diagnosis of SARS-CoV-2 by FDA under an Emergency Use Authorization (EUA).  This EUA will remain in effect (meaning this test can be used) for the duration of the COVID-19 declaration under Section 564(b)(1) of the Act, 21 U.S.C. section 360bbb-3(b)(1), unless the authorization is terminated or revoked sooner. Performed at [facility], [address] [place], [place] [address]   Body fluid culture     Status: None   Collection Time: 07/21/19  1:31 PM   Specimen: PATH Cytology Pleural fluid  Result Value Ref Range Status   Specimen Description   Final    PLEURAL Performed at [facility], [address] [place], [place] [address]    Special Requests   Final    NONE Performed at [facility], [address] [place], [place] [address]    Gram Stain   Final    FEW WBC PRESENT,BOTH PMN AND MONONUCLEAR NO ORGANISMS SEEN    Culture   Final    NO GROWTH 3 DAYS Performed at [facility], [place] [address], [place], [place] [address]    Report Status 07/25/2019 FINAL  Final  SARS Coronavirus 2 [facility] order, Performed in [facility] hospital lab) Nasopharyngeal Nasopharyngeal Swab     Status: None   Collection  Time: 07/27/19  8:16 AM   Specimen: Nasopharyngeal Swab  Result Value Ref Range Status   SARS Coronavirus 2 NEGATIVE NEGATIVE Final    Comment: (NOTE) If result is NEGATIVE SARS-CoV-2 target nucleic acids are NOT DETECTED. The SARS-CoV-2 RNA is generally detectable in upper and lower  respiratory specimens during the acute phase of infection. The lowest  concentration of SARS-CoV-2 viral copies this assay can detect is 250  copies / mL. A negative result does not preclude SARS-CoV-2 infection  and should not be used as the sole basis for treatment or other  patient management decisions.  A negative result may occur with  improper specimen collection / handling, submission of specimen other  than nasopharyngeal swab, presence of viral mutation(s) within the  areas targeted by this assay, and inadequate number of viral copies  (<250 copies / mL). A negative result must be combined with clinical  observations, patient history, and epidemiological information. If result is POSITIVE SARS-CoV-2 target nucleic  acids are DETECTED. The SARS-CoV-2 RNA is generally detectable in upper and lower  respiratory specimens dur ing the acute phase of infection.  Positive  results are indicative of active infection with SARS-CoV-2.  Clinical  correlation with patient history and other diagnostic information is  necessary to determine patient infection status.  Positive results do  not rule out bacterial infection or co-infection with other viruses. If result is PRESUMPTIVE POSTIVE SARS-CoV-2 nucleic acids MAY BE PRESENT.   A presumptive positive result was obtained on the submitted specimen  and confirmed on repeat testing.  While 2019 novel coronavirus  (SARS-CoV-2) nucleic acids may be present in the submitted sample  additional confirmatory testing may be necessary for epidemiological  and / or clinical management purposes  to differentiate between  SARS-CoV-2 and other Sarbecovirus currently known  to infect humans.  If clinically indicated additional testing with an alternate test  methodology [phone]) is advised. The SARS-CoV-2 RNA is generally  detectable in upper and lower respiratory sp ecimens during the acute  phase of infection. The expected result is Negative. Fact Sheet for Patients:  StrictlyIdeas.no Fact Sheet for Healthcare Providers: BankingDealers.co.za This test is not yet approved or cleared by the [place] FDA and has been authorized for detection and/or diagnosis of SARS-CoV-2 by FDA under an Emergency Use Authorization (EUA).  This EUA will remain in effect (meaning this test can be used) for the duration of the COVID-19 declaration under Section 564(b)(1) of the Act, 21 U.S.C. section 360bbb-3(b)(1), unless the authorization is terminated or revoked sooner. Performed at [facility], [address] [place], [place] [address]     Coagulation Studies: No results for input(s): LABPROT, INR in the last 72 hours.  Urinalysis: No results for input(s): COLORURINE, LABSPEC, PHURINE, GLUCOSEU, HGBUR, BILIRUBINUR, KETONESUR, PROTEINUR, UROBILINOGEN, NITRITE, LEUKOCYTESUR in the last 72 hours.  Invalid input(s): APPERANCEUR    Imaging: No results found.   Medications:      Current Facility-Administered Medications (Cardiovascular):  .  amLODipine (NORVASC) tablet 10 mg .  atorvastatin (LIPITOR) tablet 80 mg .  carvedilol (COREG) tablet 25 mg .  hydrALAZINE (APRESOLINE) tablet 25 mg   Current Facility-Administered Medications (Respiratory):  .  albuterol (PROVENTIL) (2.5 MG/3ML) 0.083% nebulizer solution 2.5 mg .  loratadine (CLARITIN) tablet 10 mg   Current Facility-Administered Medications (Analgesics):  .  acetaminophen (TYLENOL) tablet 650 mg **OR** acetaminophen (TYLENOL) suppository 650 mg .  aspirin EC tablet 81 mg .  HYDROmorphone (DILAUDID) injection 1 mg .  oxyCODONE (Oxy  IR/ROXICODONE) immediate release tablet 5 mg   Current Facility-Administered Medications (Hematological):  .  apixaban (ELIQUIS) tablet 2.5 mg   Current Facility-Administered Medications (Other):  Marland Kitchen  Chlorhexidine Gluconate Cloth 2 % PADS 6 each .  feeding supplement (NEPRO CARB STEADY) liquid 237 mL .  gabapentin (NEURONTIN) capsule 100 mg .  hydrOXYzine (ATARAX/VISTARIL) tablet 25 mg .  multivitamin (RENA-VIT) tablet 1 tablet .  ondansetron (ZOFRAN) tablet 4 mg **OR** ondansetron (ZOFRAN) injection 4 mg .  ondansetron (ZOFRAN) injection 4 mg .  pneumococcal 23 valent vaccine (PNU-IMMUNE) injection 0.5 mL .  polyethylene glycol (MIRALAX / GLYCOLAX) packet 17 g .  sevelamer carbonate (RENVELA) tablet 1,600 mg .  sodium chloride flush (NS) 0.9 % injection 3 mL  No current outpatient medications on file.     Assessment/ Plan:  43 y.o. caucasian male with end-stage renal disease on hemodialysis, hypertension, peripheral vascular disease, depression.  He presents for acute onset of shortness of breath  Active Problems:   Pulmonary edema  [facility] TTS  -kg Rt IJ PC 240 min  #. ESRD   -Patient missed his dialysis treatment on Saturday due to lack of transportation -3 L of fluid removed during dialysis on Monday - continue routine HD as outpatient. Patient is expected to be d/c today  # Volume overload with lower extremity edema and SOB - improved with vol removal    #. Anemia of CKD  Lab Results  Component Value Date   HGB 8.7 (L) 07/28/2019  -Epogen with hemodialysis  #. SHPTH     Component Value Date/Time   PTH 336 (H) 02/04/2019 1619   Lab Results  Component Value Date   PHOS 3.9 07/12/2019  -Low phosphorus diet    LOS: [place] 10/7/202011:43 AM  [place], [place]

## 2019-07-29 NOTE — TOC Transition Note (Signed)
Transition of Care [facility]) - CM/SW Discharge Note   Patient Details  Name: Brett Small MRN: [id number] Date of Birth: 24-Sep-1976  Transition of Care [facility]) CM/SW Contact:  Beverly Sessions, RN Phone Number: 07/29/2019, 2:40 PM   Clinical Narrative:    Patient discharged home today.  [name] dialysis liaison notified of discharge  Malachy Mood with [facility] health notified of discharge  RNCM contacted [place] [facility]  Confirmed that patient is set up with transportation for tomorrow to HD.         Final next level of care: [place] Barriers to Discharge: Barriers Resolved   Patient Goals and CMS Choice        Discharge Placement                       Discharge Plan and Services                          HH Arranged: PT, RN, Social Work [facility] Agency: [place] Date [facility] Agency Contacted: 07/29/19 Time [place]: [place] Representative spoke with at [place]: [place] Determinants of Health ([place]) Interventions     Readmission Risk Interventions Readmission Risk Prevention Plan 07/28/2019 07/28/2019 07/23/2019  Transportation Screening Complete Complete Complete  Medication Review Press photographer) Complete Complete Complete  PCP or Specialist appointment within 3-5 days of discharge Complete Complete (No Data)  PCP/Specialist Appt Not Complete comments - - -  HRI or Home Care Consult Complete Complete Complete  SW Recovery Care/Counseling Consult (No Data) - -  Palliative Care Screening Not Applicable Not Applicable Not [place] Not Applicable Not Applicable Not Applicable

## 2019-07-29 NOTE — Progress Notes (Signed)
[facility] Daily Progress Note  Subjective: 1 Day Post-Op: 1. Contrast injection left brachial axillary AV graft  Patient without complaint this AM. No issues overnight.   Objective: Vitals:   07/29/19 0428 07/29/19 0844 07/29/19 0845 07/29/19 1205  BP: 113/90 126/79  110/85  Pulse: 88 (!) 101 99 92  Resp: 19   18  Temp: (!) 97.5 F (36.4 C)   (!) 97.5 F (36.4 C)  TempSrc: Oral   Oral  SpO2: 99%   97%  Weight:      Height:        Intake/Output Summary (Last 24 hours) at 07/29/2019 1232 Last data filed at 07/29/2019 1032 Gross per 24 hour  Intake 240 ml  Output 75 ml  Net 165 ml   Physical Exam: A&Ox3, NAD Chest: Permcath intact - clean and dry CV: RRR Pulmonary: CTA Bilaterally Abdomen: Soft, Nontender, Nondistended Vascular:  Left Upper Extremity: No bruit or thrill. No vascular compromise to the extremity noted on exam.    Laboratory: CBC    Component Value Date/Time   WBC 8.5 07/28/2019 0455   HGB 8.7 (L) 07/28/2019 0455   HCT 26.9 (L) 07/28/2019 0455   PLT 181 07/28/2019 0455   BMET    Component Value Date/Time   NA 139 07/29/2019 0407   K 3.8 07/29/2019 0407   CL 103 07/29/2019 0407   CO2 28 07/29/2019 0407   GLUCOSE 91 07/29/2019 0407   BUN 16 07/29/2019 0407   CREATININE 2.55 (H) 07/29/2019 0407   CALCIUM 8.5 (L) 07/29/2019 0407   GFRNONAA 29 (L) 07/29/2019 0407   GFRAA 34 (L) 07/29/2019 0407   Assessment/Planning: The patient is a 43 year old male with multiple medical issues including end-stage renal disease who presented with a nonfunctioning left upper extremity dialysis access. 1) patient will need surgical revision of his left upper extremity dialysis access.  We will see him in the outpatient setting to plan this. 2) continue to dialyze as per nephrology through PermCath  Discussed with Dr. Eber Hong [facility] PA-C 07/29/2019 12:32 PM

## 2019-08-03 ENCOUNTER — Encounter: Payer: Self-pay | Admitting: Emergency Medicine

## 2019-08-03 ENCOUNTER — Inpatient Hospital Stay
Admission: EM | Admit: 2019-08-03 | Discharge: 2019-08-17 | DRG: 853 | Disposition: A | Discharge status: Home Health | Payer: Medicare Other | Attending: Internal Medicine | Admitting: Internal Medicine

## 2019-08-03 ENCOUNTER — Emergency Department: Payer: Medicare Other

## 2019-08-03 ENCOUNTER — Other Ambulatory Visit: Payer: Self-pay

## 2019-08-03 DIAGNOSIS — D631 Anemia in chronic kidney disease: Secondary | ICD-10-CM | POA: Diagnosis present

## 2019-08-03 DIAGNOSIS — J9601 Acute respiratory failure with hypoxia: Secondary | ICD-10-CM

## 2019-08-03 DIAGNOSIS — Z885 Allergy status to narcotic agent status: Secondary | ICD-10-CM

## 2019-08-03 DIAGNOSIS — N2581 Secondary hyperparathyroidism of renal origin: Secondary | ICD-10-CM | POA: Diagnosis present

## 2019-08-03 DIAGNOSIS — I132 Hypertensive heart and chronic kidney disease with heart failure and with stage 5 chronic kidney disease, or end stage renal disease: Secondary | ICD-10-CM | POA: Diagnosis present

## 2019-08-03 DIAGNOSIS — D72828 Other elevated white blood cell count: Secondary | ICD-10-CM

## 2019-08-03 DIAGNOSIS — M79661 Pain in right lower leg: Secondary | ICD-10-CM

## 2019-08-03 DIAGNOSIS — A419 Sepsis, unspecified organism: Principal | ICD-10-CM | POA: Diagnosis present

## 2019-08-03 DIAGNOSIS — R001 Bradycardia, unspecified: Secondary | ICD-10-CM | POA: Diagnosis present

## 2019-08-03 DIAGNOSIS — Z20828 Contact with and (suspected) exposure to other viral communicable diseases: Secondary | ICD-10-CM | POA: Diagnosis present

## 2019-08-03 DIAGNOSIS — Z8249 Family history of ischemic heart disease and other diseases of the circulatory system: Secondary | ICD-10-CM

## 2019-08-03 DIAGNOSIS — F17210 Nicotine dependence, cigarettes, uncomplicated: Secondary | ICD-10-CM | POA: Diagnosis present

## 2019-08-03 DIAGNOSIS — Z833 Family history of diabetes mellitus: Secondary | ICD-10-CM

## 2019-08-03 DIAGNOSIS — I82409 Acute embolism and thrombosis of unspecified deep veins of unspecified lower extremity: Secondary | ICD-10-CM

## 2019-08-03 DIAGNOSIS — R1011 Right upper quadrant pain: Secondary | ICD-10-CM | POA: Diagnosis present

## 2019-08-03 DIAGNOSIS — T82868A Thrombosis of vascular prosthetic devices, implants and grafts, initial encounter: Secondary | ICD-10-CM | POA: Diagnosis not present

## 2019-08-03 DIAGNOSIS — R059 Cough, unspecified: Secondary | ICD-10-CM

## 2019-08-03 DIAGNOSIS — I5023 Acute on chronic systolic (congestive) heart failure: Secondary | ICD-10-CM | POA: Diagnosis present

## 2019-08-03 DIAGNOSIS — D696 Thrombocytopenia, unspecified: Secondary | ICD-10-CM | POA: Diagnosis present

## 2019-08-03 DIAGNOSIS — Z832 Family history of diseases of the blood and blood-forming organs and certain disorders involving the immune mechanism: Secondary | ICD-10-CM

## 2019-08-03 DIAGNOSIS — I252 Old myocardial infarction: Secondary | ICD-10-CM

## 2019-08-03 DIAGNOSIS — I468 Cardiac arrest due to other underlying condition: Secondary | ICD-10-CM | POA: Diagnosis not present

## 2019-08-03 DIAGNOSIS — J810 Acute pulmonary edema: Secondary | ICD-10-CM

## 2019-08-03 DIAGNOSIS — J9602 Acute respiratory failure with hypercapnia: Secondary | ICD-10-CM | POA: Diagnosis not present

## 2019-08-03 DIAGNOSIS — I739 Peripheral vascular disease, unspecified: Secondary | ICD-10-CM | POA: Diagnosis present

## 2019-08-03 DIAGNOSIS — T829XXA Unspecified complication of cardiac and vascular prosthetic device, implant and graft, initial encounter: Secondary | ICD-10-CM

## 2019-08-03 DIAGNOSIS — F329 Major depressive disorder, single episode, unspecified: Secondary | ICD-10-CM | POA: Diagnosis present

## 2019-08-03 DIAGNOSIS — E785 Hyperlipidemia, unspecified: Secondary | ICD-10-CM | POA: Diagnosis present

## 2019-08-03 DIAGNOSIS — R05 Cough: Secondary | ICD-10-CM

## 2019-08-03 DIAGNOSIS — Z882 Allergy status to sulfonamides status: Secondary | ICD-10-CM

## 2019-08-03 DIAGNOSIS — M79662 Pain in left lower leg: Secondary | ICD-10-CM

## 2019-08-03 DIAGNOSIS — Z66 Do not resuscitate: Secondary | ICD-10-CM | POA: Diagnosis not present

## 2019-08-03 DIAGNOSIS — I480 Paroxysmal atrial fibrillation: Secondary | ICD-10-CM | POA: Diagnosis present

## 2019-08-03 DIAGNOSIS — H5704 Mydriasis: Secondary | ICD-10-CM | POA: Diagnosis present

## 2019-08-03 DIAGNOSIS — Z7982 Long term (current) use of aspirin: Secondary | ICD-10-CM

## 2019-08-03 DIAGNOSIS — R739 Hyperglycemia, unspecified: Secondary | ICD-10-CM | POA: Diagnosis present

## 2019-08-03 DIAGNOSIS — E872 Acidosis: Secondary | ICD-10-CM | POA: Diagnosis present

## 2019-08-03 DIAGNOSIS — Z992 Dependence on renal dialysis: Secondary | ICD-10-CM

## 2019-08-03 DIAGNOSIS — I509 Heart failure, unspecified: Secondary | ICD-10-CM

## 2019-08-03 DIAGNOSIS — N186 End stage renal disease: Secondary | ICD-10-CM | POA: Diagnosis present

## 2019-08-03 DIAGNOSIS — M7989 Other specified soft tissue disorders: Secondary | ICD-10-CM

## 2019-08-03 DIAGNOSIS — Z9981 Dependence on supplemental oxygen: Secondary | ICD-10-CM

## 2019-08-03 DIAGNOSIS — I721 Aneurysm of artery of upper extremity: Secondary | ICD-10-CM | POA: Diagnosis present

## 2019-08-03 DIAGNOSIS — T8130XA Disruption of wound, unspecified, initial encounter: Secondary | ICD-10-CM | POA: Diagnosis not present

## 2019-08-03 DIAGNOSIS — Z7901 Long term (current) use of anticoagulants: Secondary | ICD-10-CM

## 2019-08-03 DIAGNOSIS — Z01818 Encounter for other preprocedural examination: Secondary | ICD-10-CM

## 2019-08-03 DIAGNOSIS — Z79899 Other long term (current) drug therapy: Secondary | ICD-10-CM

## 2019-08-03 DIAGNOSIS — R140 Abdominal distension (gaseous): Secondary | ICD-10-CM

## 2019-08-03 DIAGNOSIS — E440 Moderate protein-calorie malnutrition: Secondary | ICD-10-CM | POA: Insufficient documentation

## 2019-08-03 DIAGNOSIS — Z4659 Encounter for fitting and adjustment of other gastrointestinal appliance and device: Secondary | ICD-10-CM

## 2019-08-03 DIAGNOSIS — J9600 Acute respiratory failure, unspecified whether with hypoxia or hypercapnia: Secondary | ICD-10-CM

## 2019-08-03 DIAGNOSIS — I5031 Acute diastolic (congestive) heart failure: Secondary | ICD-10-CM | POA: Diagnosis not present

## 2019-08-03 DIAGNOSIS — R569 Unspecified convulsions: Secondary | ICD-10-CM

## 2019-08-03 LAB — COMPREHENSIVE METABOLIC PANEL
ALT: 11 U/L (ref 0–44)
AST: 28 U/L (ref 15–41)
Albumin: 2.9 g/dL — ABNORMAL LOW (ref 3.5–5.0)
Alkaline Phosphatase: 73 U/L (ref 38–126)
Anion gap: 13 (ref 5–15)
BUN: 29 mg/dL — ABNORMAL HIGH (ref 6–20)
CO2: 21 mmol/L — ABNORMAL LOW (ref 22–32)
Calcium: 8.4 mg/dL — ABNORMAL LOW (ref 8.9–10.3)
Chloride: 100 mmol/L (ref 98–111)
Creatinine, Ser: 4.19 mg/dL — ABNORMAL HIGH (ref 0.61–1.24)
GFR calc Af Amer: 19 mL/min — ABNORMAL LOW (ref >60)
GFR calc non Af Amer: 16 mL/min — ABNORMAL LOW (ref >60)
Glucose, Bld: 305 mg/dL — ABNORMAL HIGH (ref 70–99)
Potassium: 3.2 mmol/L — ABNORMAL LOW (ref 3.5–5.1)
Sodium: 134 mmol/L — ABNORMAL LOW (ref 135–145)
Total Bilirubin: 1.5 mg/dL — ABNORMAL HIGH (ref 0.3–1.2)
Total Protein: 6.4 g/dL — ABNORMAL LOW (ref 6.5–8.1)

## 2019-08-03 LAB — CBC WITH DIFFERENTIAL/PLATELET
Abs Immature Granulocytes: 0.11 10*3/uL — ABNORMAL HIGH (ref 0.00–0.07)
Basophils Absolute: 0.2 10*3/uL — ABNORMAL HIGH (ref 0.0–0.1)
Basophils Relative: 1 %
Eosinophils Absolute: 0.5 10*3/uL (ref 0.0–0.5)
Eosinophils Relative: 3 %
HCT: 33.6 % — ABNORMAL LOW (ref 39.0–52.0)
Hemoglobin: 10.4 g/dL — ABNORMAL LOW (ref 13.0–17.0)
Immature Granulocytes: 1 %
Lymphocytes Relative: 11 %
Lymphs Abs: 1.8 10*3/uL (ref 0.7–4.0)
MCH: 32.4 pg (ref 26.0–34.0)
MCHC: 31 g/dL (ref 30.0–36.0)
MCV: 104.7 fL — ABNORMAL HIGH (ref 80.0–100.0)
Monocytes Absolute: 0.5 10*3/uL (ref 0.1–1.0)
Monocytes Relative: 3 %
Neutro Abs: 14.1 10*3/uL — ABNORMAL HIGH (ref 1.7–7.7)
Neutrophils Relative %: 81 %
Platelets: 268 10*3/uL (ref 150–400)
RBC: 3.21 MIL/uL — ABNORMAL LOW (ref 4.22–5.81)
RDW: 17.3 % — ABNORMAL HIGH (ref 11.5–15.5)
WBC: 17.2 10*3/uL — ABNORMAL HIGH (ref 4.0–10.5)
nRBC: 0 % (ref 0.0–0.2)

## 2019-08-03 LAB — TROPONIN I (HIGH SENSITIVITY)
Troponin I (High Sensitivity): 22 ng/L — ABNORMAL HIGH (ref <18)
Troponin I (High Sensitivity): 47 ng/L — ABNORMAL HIGH (ref <18)

## 2019-08-03 LAB — LACTIC ACID, PLASMA: Lactic Acid, Venous: 4.1 mmol/L (ref 0.5–1.9)

## 2019-08-03 LAB — BLOOD GAS, VENOUS
Patient temperature: 37
pCO2, Ven: 42 mmHg — ABNORMAL LOW (ref 44.0–60.0)
pH, Ven: 7.28 (ref 7.250–7.430)
pO2, Ven: 54 mmHg — ABNORMAL HIGH (ref 32.0–45.0)

## 2019-08-03 LAB — BRAIN NATRIURETIC PEPTIDE: B Natriuretic Peptide: >4500 pg/mL — ABNORMAL HIGH (ref 0.0–100.0)

## 2019-08-03 LAB — SARS CORONAVIRUS 2 BY RT PCR (HOSPITAL ORDER, PERFORMED IN ~~LOC~~ HOSPITAL LAB): SARS Coronavirus 2: NEGATIVE

## 2019-08-03 MED ORDER — NITROGLYCERIN IN D5W 200-5 MCG/ML-% IV SOLN
0.0000 ug/min | INTRAVENOUS | Status: DC
  Administered 2019-08-03: 40 ug/min via INTRAVENOUS

## 2019-08-03 MED ORDER — VANCOMYCIN HCL IN DEXTROSE 1-5 GM/200ML-% IV SOLN
1000.0000 mg | Freq: Once | INTRAVENOUS | Status: AC
  Administered 2019-08-03: 1000 mg via INTRAVENOUS
  Filled 2019-08-03: qty 200

## 2019-08-03 MED ORDER — SODIUM CHLORIDE 0.9 % IV SOLN
2.0000 g | Freq: Once | INTRAVENOUS | Status: AC
  Administered 2019-08-03: 2 g via INTRAVENOUS
  Filled 2019-08-03: qty 2

## 2019-08-03 MED ORDER — NITROGLYCERIN 0.4 MG SL SUBL
0.4000 mg | SUBLINGUAL_TABLET | SUBLINGUAL | Status: DC | PRN
  Administered 2019-08-03 – 2019-08-08 (×4): 0.4 mg via SUBLINGUAL
  Filled 2019-08-03: qty 1

## 2019-08-03 MED ORDER — ONDANSETRON HCL 4 MG/2ML IJ SOLN
4.0000 mg | Freq: Once | INTRAMUSCULAR | Status: AC
  Administered 2019-08-03: 4 mg via INTRAVENOUS
  Filled 2019-08-03: qty 2

## 2019-08-03 MED ORDER — FENTANYL CITRATE (PF) 100 MCG/2ML IJ SOLN
50.0000 ug | Freq: Once | INTRAMUSCULAR | Status: AC
  Administered 2019-08-03: 50 ug via INTRAVENOUS
  Filled 2019-08-03: qty 2

## 2019-08-03 MED ORDER — ONDANSETRON HCL 4 MG/2ML IJ SOLN
4.0000 mg | Freq: Once | INTRAMUSCULAR | Status: AC
  Administered 2019-08-03: 4 mg via INTRAVENOUS

## 2019-08-03 NOTE — ED Notes (Signed)
Pt difficult stick, will not delay antibiotics after one set of cultures per MD

## 2019-08-03 NOTE — ED Notes (Signed)
This nurse spoke with patient's father who is emergency contact.  He wants a physician to call him tomorrow(10-13) with an update on the patient.

## 2019-08-03 NOTE — Consult Note (Signed)
CODE SEPSIS - PHARMACY COMMUNICATION  **Broad Spectrum Antibiotics should be administered within 1 hour of Sepsis diagnosis**  Time Code Sepsis Called/Page Received: 1056  Antibiotics Ordered: Cefepime/Vancomycin  Time of 1st antibiotic administration: 1008   Additional action taken by pharmacy: None  If necessary, Name of Provider/Nurse Contacted: None    Lu Duffel ,PharmD Clinical Pharmacist  08/03/2019  11:41 AM

## 2019-08-03 NOTE — Progress Notes (Signed)
HD Tx Initiated:    08/03/19 2215  Vital Signs  Temp (!) 97.2 F (36.2 C)  Temp Source Axillary  Pulse Rate 91  Pulse Rate Source Monitor  Resp 13  BP 125/90  BP Location Right Arm  BP Method Automatic  Patient Position (if appropriate) Lying  Oxygen Therapy  SpO2 98 %  O2 Device Bi-PAP  Pain Assessment  Pain Scale 0-10  Pain Score 0  Dialysis Weight  Weight  (uable to weigh pt in stretcher)  Type of Weight Pre-Dialysis  During Hemodialysis Assessment  Blood Flow Rate (mL/min) 400 mL/min  Arterial Pressure (mmHg) -150 mmHg  Venous Pressure (mmHg) 110 mmHg  Transmembrane Pressure (mmHg) 70 mmHg  Ultrafiltration Rate (mL/min) 830 mL/min  Dialysate Flow Rate (mL/min) 800 ml/min  Conductivity: Machine  14  HD Safety Checks Performed Yes  Dialysis Fluid Bolus Normal Saline  Bolus Amount (mL) 250 mL  Intra-Hemodialysis Comments Tx initiated  Hemodialysis Catheter Right Internal jugular Double lumen Temporary (Non-Tunneled)  No Placement Date or Time found.   Placed prior to admission: Yes  Orientation: Right  Access Location: Internal jugular  Hemodialysis Catheter Type: Double lumen Temporary (Non-Tunneled)  Site Condition No complications  Blue Lumen Status Blood return noted  Red Lumen Status Blood return noted  Purple Lumen Status N/A  Dressing Type Gauze/Drain sponge  Dressing Status Intact  Drainage Description None

## 2019-08-03 NOTE — ED Notes (Signed)
dialsyis nurse with pt. nsr on monitor.

## 2019-08-03 NOTE — ED Notes (Signed)
Pt on bipap at this time.

## 2019-08-03 NOTE — Progress Notes (Signed)
PHARMACY -  BRIEF ANTIBIOTIC NOTE   Pharmacy has received consult(s) for Cefepime and Vancomycin from an ED provider.  The patient's profile has been reviewed for ht/wt/allergies/indication/available labs. Patient has a PMH significant for ESRD and receives HD TThSat. She was recently discharged on 07/29/2019.   One time order(s) placed for: Cefepime 2g x1 dose and Vancomcyin 1g X1 dose.   Further antibiotics/pharmacy consults should be ordered by admitting physician if indicated.                       Thank you, Rowland Lathe 08/03/2019  9:57 AM

## 2019-08-03 NOTE — Progress Notes (Signed)
Pre HD Assessment:    08/03/19 2201  Neurological  Level of Consciousness Alert  Orientation Level Oriented X4  Respiratory  Respiratory Pattern Labored  Cardiac  Pulse Regular  Cardiac Rhythm NSR  Vascular  R Radial Pulse +2  L Radial Pulse +2  Psychosocial  Psychosocial (WDL) WDL

## 2019-08-03 NOTE — Progress Notes (Signed)
Pre HD:    08/03/19 2200  Vital Signs  Temp (!) 97.2 F (36.2 C)  Temp Source Axillary  Pulse Rate 92  Pulse Rate Source Monitor  Resp 13  BP (!) 143/108  BP Location Right Arm  BP Method Automatic  Patient Position (if appropriate) Lying  Oxygen Therapy  SpO2 97 %  O2 Device Bi-PAP  Pain Assessment  Pain Scale 0-10  Pain Score 0  Time-Out for Hemodialysis  What Procedure? HD   Pt Identifiers(min of two) First/Last Name;MRN/Account#;Pt's DOB(use if MRN/Acct# not available  Correct Site? Yes  Correct Side? Yes  Correct Procedure? Yes  Consents Verified? Yes  Rad Studies Available? N/A  Safety Precautions Reviewed? Yes  Engineer, civil (consulting) Number [place] Number  (ED 17)  UF/Alarm Test Passed  Conductivity: Meter 13.8  Conductivity: Machine  14  pH 7.2  Reverse Osmosis WRO# 4  Normal Saline Lot Number [id number]  Dialyzer Lot Number [id number]  Disposable Set Lot Number [id number]  Machine Temperature 98.6 F (37 C)  Musician and Audible Yes  Blood Lines Intact and Secured Yes  Pre Treatment Patient Checks  Vascular access used during treatment Catheter  HD catheter dressing before treatment Peeling edges  Patient is receiving dialysis in a chair  (no)  Hepatitis B Surface Antigen Results Pending  Date Hepatitis B Surface Antigen Drawn 08/03/19  Isolation Initiated  (BS)  Hepatitis B Surface Antibody  (<10)  Date Hepatitis B Surface Antibody Drawn 05/20/19  Hemodialysis Consent Verified Yes  Hemodialysis Standing Orders Initiated Yes  ECG (Telemetry) Monitor On Yes  Prime Ordered Normal Saline  Length of  DialysisTreatment -hour(s) 3 Hour(s)  Dialysis Treatment Comments  (Na 140)  Dialyzer Elisio 17H NR  Dialysate 2K;2.5 Ca  Dialysate Flow Ordered 800  Blood Flow Rate Ordered 400 mL/min  Ultrafiltration Goal 2 Liters  Dialysis Blood Pressure Support Ordered Normal Saline  Education / Care Plan  Dialysis Education Provided Yes  Documented  Education in Care Plan Yes  Outpatient Plan of Care Reviewed and on Chart Yes  Hemodialysis Catheter Right Internal jugular Double lumen Temporary (Non-Tunneled)  No Placement Date or Time found.   Placed prior to admission: Yes  Orientation: Right  Access Location: Internal jugular  Hemodialysis Catheter Type: Double lumen Temporary (Non-Tunneled)  Site Condition No complications  Blue Lumen Status Blood return noted  Red Lumen Status Blood return noted  Purple Lumen Status N/A  Dressing Type Gauze/Drain sponge  Dressing Status Intact  Drainage Description None

## 2019-08-03 NOTE — ED Provider Notes (Signed)
-----------------------------------------   11:57 PM on 08/03/2019 -----------------------------------------  Assumed care of patient.  In summary, patient presented approximately 14 hours ago to the emergency department in respiratory distress and placed on BiPAP.  Nephrology was consulted and agreed with urgent hemodialysis in the hopes of weaning off BiPAP and going to a floor bed.  Due to several delays, hemodialysis was just started 1 hour ago.  Will reassess patient after dialysis and attempt to wean off BiPAP.  I have discussed case with hospitalist services who are aware of this patient requiring admission.   Paulette Blanch, MD 08/04/19 [phone]

## 2019-08-03 NOTE — ED Notes (Signed)
US at bedside

## 2019-08-03 NOTE — ED Notes (Signed)
Bi pap removed while pt is vomiting, pt tolerating . MD at bedside

## 2019-08-03 NOTE — Progress Notes (Addendum)
Notified bedside nurse of need to order lactic acid and fluid bolus.  Called bedside RN to see If they can get another repeat lactic acid, and also the patient meets criteria for fluid resuscitation. The pt is fluid overloaded and going to receive dialysis is why fluids weren't ordered.

## 2019-08-03 NOTE — ED Notes (Signed)
Dialysis nurse in with pt now.  Pt alert.  nsr on monitor.

## 2019-08-03 NOTE — ED Triage Notes (Signed)
Pt via EMS from home with c/o resp distress. Pt was placed on cpap in route, pt severely  Diaphoretic, and tachpnea. MD and RT at bedside

## 2019-08-03 NOTE — ED Provider Notes (Addendum)
[facility] Emergency Department Provider Note  ____________________________________________   First MD Initiated Contact with Patient 08/03/19 [phone]     (approximate)  I have reviewed the triage vital signs and the nursing notes.   HISTORY  Chief Complaint Respiratory Distress    HPI Brett Small is a 43 y.o. male with past medical history as below including incisional disease, peripheral vascular disease, here with shortness of breath.  History limited due to severe dyspnea.  Per report, the patient states that he was relatively well until this morning.  He went to dialysis as per usual.  He states that he developed acute onset of severe shortness of breath this morning.  He states he feels like he cannot breathe.  He has been coughing up some phlegm.  He denies any fevers yesterday or this morning that he is aware of.  Per EMS report, the patient was significantly hypoxic and dyspneic on arrival.  He was placed on CPAP with mild improvement.  He has been reportedly worsening since EMS picked him up.  On arrival, patient in moderate distress, limiting history.  He is unable to talk.  Level 5 caveat invoked as remainder of history, ROS, and physical exam limited due to patient's respiratory distress.         Past Medical History:  Diagnosis Date  . ESRD (end stage renal disease) ([place])   . Hypertension   . PAD (peripheral artery disease) (HCC)    Required aortofemoral stent-which had closed and had to redo the procedure and ischemia of limb.  . Peripheral vascular disease ([place])   . Renal disorder   . Secondary hyperparathyroidism of renal origin [facility])     Patient Active Problem List   Diagnosis Date Noted  . Pulmonary edema 07/27/2019  . Pleural effusion 07/20/2019  . NSTEMI (non-ST elevated myocardial infarction) ([place]) 07/09/2019  . Pneumonia 07/02/2019  . Acute liver failure 05/19/2019  . Hematemesis 05/19/2019  . Hepatitis   .  Thrombocytopenia ([place])   . Coagulopathy ([place])   . Sepsis ([place]) 02/28/2019  . Lobar pneumonia ([place]) 02/28/2019  . Acute respiratory failure ([place]) 02/04/2019  . Acute respiratory failure with hypoxemia ([place]) 01/27/2019  . Depression 01/07/2019  . MDD (major depressive disorder), single episode, severe , no psychosis ([place])   . Homelessness   . Acute respiratory failure with hypoxia ([place]) 12/25/2018  . End stage renal disease on dialysis ([place]) 12/25/2018  . Hypertension 12/25/2018  . Renal osteodystrophy 12/25/2018    Past Surgical History:  Procedure Laterality Date  . A/V SHUNT INTERVENTION Left 01/19/2019   Procedure: LEFT UPPER EXTREMITY A/V SHUNTOGRAM / UPPER EXTREMITY ANGIOGRAM;  Surgeon: Algernon Huxley, MD;  Location: [place] [facility];  Service: Cardiovascular;  Laterality: Left;  . A/V SHUNT INTERVENTION Left 03/02/2019   Procedure: A/V SHUNT INTERVENTION;  Surgeon: Algernon Huxley, MD;  Location: [place] [facility];  Service: Cardiovascular;  Laterality: Left;  . AORTA - FEMORAL ARTERY BYPASS GRAFT    . AV FISTULA PLACEMENT Left 12/26/2018   Procedure: INSERTION OF GORE STRETCH VASCULAR 4-7MM X  45CM IN LEFT UPPER ARM;  Surgeon: Marty Heck, MD;  Location: [place];  Service: Vascular;  Laterality: Left;  . DIALYSIS/PERMA CATHETER REMOVAL N/A 02/06/2019   Procedure: DIALYSIS/PERMA CATHETER REMOVAL;  Surgeon: Algernon Huxley, MD;  Location: [place] [facility];  Service: Cardiovascular;  Laterality: N/A;  . LEFT HEART CATH AND CORONARY ANGIOGRAPHY Right 07/10/2019   Procedure: LEFT HEART CATH  AND CORONARY ANGIOGRAPHY;  Surgeon: Dionisio David, MD;  Location: [place] [facility];  Service: Cardiovascular;  Laterality: Right;  . PERIPHERAL VASCULAR THROMBECTOMY Left 07/28/2019   Procedure: Left Upper Extremity Dialysis Access [place];  Surgeon: Katha Cabal, MD;  Location: [place] [facility];  Service: Cardiovascular;  Laterality: Left;    Prior to Admission medications    Medication Sig Start Date End Date Taking? Authorizing Provider  amLODipine (NORVASC) 10 MG tablet Take 1 tablet (10 mg total) by mouth daily at 8 pm. 02/24/19   Gladstone Lighter, MD  apixaban (ELIQUIS) 2.5 MG TABS tablet Take 1 tablet (2.5 mg total) by mouth 2 (two) times daily. 01/26/19   Clapacs, Madie Reno, MD  aspirin EC 81 MG tablet Take 1 tablet (81 mg total) by mouth daily. 01/26/19   Clapacs, Madie Reno, MD  atorvastatin (LIPITOR) 80 MG tablet Take 1 tablet (80 mg total) by mouth at bedtime. 01/26/19   Clapacs, Madie Reno, MD  b complex-C-folic acid 1 MG capsule Take 1 capsule by mouth daily after supper.     [provider]  carvedilol (COREG) 25 MG tablet Take 1 tablet (25 mg total) by mouth 2 (two) times daily with a meal. 01/26/19   Clapacs, Madie Reno, MD  gabapentin (NEURONTIN) 100 MG capsule Take 1 capsule (100 mg total) by mouth 3 (three) times daily. 01/26/19   Clapacs, Madie Reno, MD  hydrALAZINE (APRESOLINE) 25 MG tablet Take 1 tablet (25 mg total) by mouth every 8 (eight) hours. 01/26/19   Clapacs, Madie Reno, MD  hydrOXYzine (ATARAX/VISTARIL) 25 MG tablet Take 25 mg by mouth 3 (three) times daily as needed for anxiety.    [provider]  loratadine (CLARITIN) 10 MG tablet Take 1 tablet (10 mg total) by mouth daily. 03/04/19   Salary, Holly Bodily D, MD  multivitamin (RENA-VIT) TABS tablet Take 1 tablet by mouth daily. 01/27/19   Clapacs, Madie Reno, MD  neomycin-bacitracin-polymyxin (NEOSPORIN) OINT Apply 1 application topically 2 (two) times daily. 01/26/19   Clapacs, Madie Reno, MD  Nutritional Supplements (FEEDING SUPPLEMENT, NEPRO CARB STEADY,) LIQD Take 237 mLs by mouth 2 (two) times daily between meals. 07/06/19   Epifanio Lesches, MD  sevelamer carbonate (RENVELA) 800 MG tablet Take 2 tablets (1,600 mg total) by mouth 3 (three) times daily with meals. 01/26/19   Clapacs, Madie Reno, MD    Allergies Codeine and Sulfa antibiotics  Family History  Problem Relation Age of Onset  . Hypertension Other   .  Diabetes Other   . Clotting disorder Father     Social History Social History   Tobacco Use  . Smoking status: Current Every Day Smoker    Packs/day: 0.50    Years: 30.00    Pack years: 15.00    Types: Cigarettes    Last attempt to quit: 06/26/2018    Years since quitting: 1.1  . Smokeless tobacco: Never Used  . Tobacco comment: smoked for 30 years   Substance Use Topics  . Alcohol use: Not Currently  . Drug use: Yes    Frequency: 1.0 times per week    Types: Marijuana    Comment: Pt states "maybe once or twice a week".     Review of Systems  Review of Systems  Unable to perform ROS: Acuity of condition  Constitutional: Positive for fatigue.  Respiratory: Positive for cough, shortness of breath and wheezing.   Neurological: Positive for weakness.  All other systems reviewed and are negative.  ____________________________________________  PHYSICAL EXAM:      VITAL SIGNS: ED Triage Vitals [08/03/19 0912]  Enc Vitals Group     BP (!) 194/153     Pulse Rate (!) 160     Resp (!) 26     Temp      Temp src      SpO2 100 %     Weight      Height      Head Circumference      Peak Flow      Pain Score      Pain Loc      Pain Edu?      Excl. in [place]?      Physical Exam Vitals signs and nursing note reviewed.  Constitutional:      General: He is in acute distress.     Appearance: He is well-developed. He is ill-appearing.  HENT:     Head: Normocephalic and atraumatic.  Eyes:     Conjunctiva/sclera: Conjunctivae normal.  Neck:     Musculoskeletal: Neck supple.  Cardiovascular:     Rate and Rhythm: Normal rate and regular rhythm.     Heart sounds: Normal heart sounds.  Pulmonary:     Effort: Tachypnea and respiratory distress present.     Breath sounds: Examination of the right-upper field reveals rales. Examination of the left-upper field reveals rales. Examination of the right-middle field reveals rales. Examination of the left-middle field reveals rales.  Examination of the right-lower field reveals rales. Examination of the left-lower field reveals rales. Decreased breath sounds and rales present. No wheezing.  Abdominal:     General: There is no distension.  Skin:    General: Skin is warm.     Capillary Refill: Capillary refill takes less than 2 seconds.     Findings: No rash.  Neurological:     Mental Status: He is alert and oriented to person, place, and time.     Motor: No abnormal muscle tone.       ____________________________________________   LABS (all labs ordered are listed, but only abnormal results are displayed)  Labs Reviewed  CBC WITH DIFFERENTIAL/PLATELET - Abnormal; Notable for the following components:      Result Value   WBC 17.2 (*)    RBC 3.21 (*)    Hemoglobin 10.4 (*)    HCT 33.6 (*)    MCV 104.7 (*)    RDW 17.3 (*)    Neutro Abs 14.1 (*)    Basophils Absolute 0.2 (*)    Abs Immature Granulocytes 0.11 (*)    All other components within normal limits  BRAIN NATRIURETIC PEPTIDE - Abnormal; Notable for the following components:   B Natriuretic Peptide >4,500.0 (*)    All other components within normal limits  BLOOD GAS, VENOUS - Abnormal; Notable for the following components:   pCO2, Ven 42 (*)    pO2, Ven 54.0 (*)    All other components within normal limits  COMPREHENSIVE METABOLIC PANEL - Abnormal; Notable for the following components:   Sodium 134 (*)    Potassium 3.2 (*)    CO2 21 (*)    Glucose, Bld 305 (*)    BUN 29 (*)    Creatinine, Ser 4.19 (*)    Calcium 8.4 (*)    Total Protein 6.4 (*)    Albumin 2.9 (*)    Total Bilirubin 1.5 (*)    GFR calc non Af Amer 16 (*)    GFR calc Af Amer 19 (*)  All other components within normal limits  LACTIC ACID, PLASMA - Abnormal; Notable for the following components:   Lactic Acid, Venous 4.1 (*)    All other components within normal limits  TROPONIN I (HIGH SENSITIVITY) - Abnormal; Notable for the following components:   Troponin I (High  Sensitivity) 22 (*)    All other components within normal limits  SARS CORONAVIRUS 2 BY RT PCR (HOSPITAL ORDER, [place] LAB)  CULTURE, BLOOD (ROUTINE X 2)  MRSA PCR SCREENING  TROPONIN I (HIGH SENSITIVITY)    ____________________________________________  EKG: Sinus tachycardia, ventricular rate 158.  QRS 82, QTc 503.  Since last EKG, diffuse ST changes, likely rate related. ________________________________________  RADIOLOGY All imaging, including plain films, CT scans, and ultrasounds, independently reviewed by me, and interpretations confirmed via formal radiology reads.  ED MD interpretation:   Chest x-ray: Multifocal PNA w/ edema  Official radiology report(s): Dg Chest Portable 1 View  Result Date: 08/03/2019 CLINICAL DATA:  Respiratory distress. EXAM: PORTABLE CHEST 1 VIEW COMPARISON:  October 5th 2020. FINDINGS: Stable cardiomegaly. No pneumothorax is noted. Increased bilateral perihilar and basilar opacities are noted concerning for worsening edema or possibly pneumonia. Small left pleural effusion is noted. Stable right internal jugular dialysis catheter is noted. Bony thorax is unremarkable. IMPRESSION: Increased bilateral lung opacities are noted concerning for worsening edema or possibly pneumonia. Small left pleural effusion is noted. Electronically Signed   By: Marijo Conception M.D.   On: 08/03/2019 09:43    ____________________________________________  PROCEDURES   Procedure(s) performed (including Critical Care):  .Critical Care Performed by: Duffy Bruce, MD Authorized by: Duffy Bruce, MD   Critical care provider statement:    Critical care time (minutes):  35   Critical care time was exclusive of:  Separately billable procedures and treating other patients and teaching time   Critical care was necessary to treat or prevent imminent or life-threatening deterioration of the following conditions:  Circulatory failure, cardiac  failure and respiratory failure   Critical care was time spent personally by me on the following activities:  Development of treatment plan with patient or surrogate, discussions with consultants, evaluation of patient's response to treatment, examination of patient, obtaining history from patient or surrogate, ordering and performing treatments and interventions, ordering and review of laboratory studies, ordering and review of radiographic studies, pulse oximetry, re-evaluation of patient's condition and review of old charts   I assumed direction of critical care for this patient from another provider in my specialty: no    Ultrasound ED Peripheral IV (Provider)  Date/Time: 08/03/2019 9:53 AM Performed by: Duffy Bruce, MD Authorized by: Duffy Bruce, MD   Procedure details:    Indications: multiple failed IV attempts     Skin Prep: chlorhexidine gluconate     Location:  Right forearm   Angiocath:  20 G   Bedside Ultrasound Guided: Yes     Images: archived     Patient tolerated procedure without complications: Yes     Dressing applied: Yes      ____________________________________________  INITIAL IMPRESSION / MDM / [place] / ED COURSE  As part of my medical decision making, I reviewed the following data within the [place] was evaluated in Emergency Department on 08/03/2019 for the symptoms described in the history of present illness. He was evaluated in the context of the global COVID-19 pandemic, which necessitated consideration that the patient might be at  risk for infection with the SARS-CoV-2 virus that causes COVID-19. Institutional protocols and algorithms that pertain to the evaluation of patients at risk for COVID-19 are in a state of rapid change based on information released by regulatory bodies including the CDC and federal and state organizations. These policies and algorithms were followed during the patient's  care in the ED.  Some ED evaluations and interventions may be delayed as a result of limited staffing during the pandemic.*   Clinical Course as of Aug 03 1139  Mon Aug 02, 9724  4023 43 year old male with history of end-stage renal disease here with severe, acute onset respiratory distress.  Exam and history is concerning for significant flash pulmonary edema with respiratory failure.  On arrival, patient placed on BiPAP with sublingual and then IV nitroglycerin for blood pressure control, with fairly significant clinical improvement.  Leukocytosis noted on initial labs, with chest x-ray showing worsening edema versus pneumonia.  Will cover empirically and Covid has been sent.  Plan discussed with nephrology.  Continue blood pressure management.   [CI]  1138 Lactic acid elevated, likely multifactorial secondary increased work of breathing and possible pneumonia.  Fortunately, Covid is negative.  He appears markedly improved on nitro and BiPAP.  Discussed with Dr. Zollie Scale of nephrology, who will take for dialysis.  Otherwise, plan to admit to medicine.   [CI]    Clinical Course User Index [CI] Duffy Bruce, MD    Medical Decision Making:  As above.  ADDENDUM: Due to no bed availability in ICU, pt wil be dialyzed in ED as I suspect he can be taken off BIPAP once dialyzed. If he is able to come off BIPAP, can admit to floor for HCAP, monitoring. NOTE: 30cc/kg contraindicated and not given in setting of ESRD status  ____________________________________________  FINAL CLINICAL IMPRESSION(S) / ED DIAGNOSES  Final diagnoses:  Flash pulmonary edema (HCC)  Acute respiratory failure with hypoxia (HCC)  Other elevated white blood cell (WBC) count     MEDICATIONS GIVEN DURING THIS VISIT:  Medications  nitroGLYCERIN 50 mg in dextrose 5 % 250 mL (0.2 mg/mL) infusion (0 mcg/min Intravenous Stopped 08/03/19 1048)  nitroGLYCERIN (NITROSTAT) SL tablet 0.4 mg (0.4 mg Sublingual Given 08/03/19 0919)   vancomycin (VANCOCIN) IVPB 1000 mg/200 mL premix (1,000 mg Intravenous New Bag/Given 08/03/19 1132)  fentaNYL (SUBLIMAZE) injection 50 mcg (50 mcg Intravenous Given 08/03/19 0929)  ceFEPIme (MAXIPIME) 2 g in sodium chloride 0.9 % 100 mL IVPB (0 g Intravenous Stopped 08/03/19 1131)  ondansetron (ZOFRAN) injection 4 mg (4 mg Intravenous Given 08/03/19 0945)     ED Discharge Orders    None       Note:  This document was prepared using Dragon voice recognition software and may include unintentional dictation errors.   Duffy Bruce, MD 08/03/19 [place]    Duffy Bruce, MD 08/03/19 1550    Duffy Bruce, MD 08/06/19 1535

## 2019-08-03 NOTE — ED Notes (Signed)
Pt is on bipap and continues to wait for dialysis.  nsr on monitor.

## 2019-08-03 NOTE — ED Notes (Signed)
Resumed care from [facility].  Pt sleeping  Pt on bipap.  nsr on monitor.  Iv in place.  Skin warm and dry

## 2019-08-03 NOTE — ED Notes (Signed)
Pt waiting on dialysis.  bipap in place.

## 2019-08-04 ENCOUNTER — Encounter: Payer: Self-pay | Admitting: Emergency Medicine

## 2019-08-04 ENCOUNTER — Other Ambulatory Visit: Payer: Self-pay

## 2019-08-04 ENCOUNTER — Inpatient Hospital Stay
Admit: 2019-08-04 | Discharge: 2019-08-04 | Disposition: A | Discharge status: Home / Self Care | Payer: Medicare Other | Attending: Family Medicine | Admitting: Family Medicine

## 2019-08-04 DIAGNOSIS — J9602 Acute respiratory failure with hypercapnia: Secondary | ICD-10-CM | POA: Diagnosis not present

## 2019-08-04 DIAGNOSIS — H5704 Mydriasis: Secondary | ICD-10-CM | POA: Diagnosis present

## 2019-08-04 DIAGNOSIS — Z885 Allergy status to narcotic agent status: Secondary | ICD-10-CM | POA: Diagnosis not present

## 2019-08-04 DIAGNOSIS — E872 Acidosis: Secondary | ICD-10-CM | POA: Diagnosis present

## 2019-08-04 DIAGNOSIS — Z992 Dependence on renal dialysis: Secondary | ICD-10-CM | POA: Diagnosis not present

## 2019-08-04 DIAGNOSIS — F17210 Nicotine dependence, cigarettes, uncomplicated: Secondary | ICD-10-CM | POA: Diagnosis present

## 2019-08-04 DIAGNOSIS — I5023 Acute on chronic systolic (congestive) heart failure: Secondary | ICD-10-CM | POA: Diagnosis present

## 2019-08-04 DIAGNOSIS — I132 Hypertensive heart and chronic kidney disease with heart failure and with stage 5 chronic kidney disease, or end stage renal disease: Secondary | ICD-10-CM | POA: Diagnosis present

## 2019-08-04 DIAGNOSIS — R1011 Right upper quadrant pain: Secondary | ICD-10-CM | POA: Diagnosis present

## 2019-08-04 DIAGNOSIS — A419 Sepsis, unspecified organism: Secondary | ICD-10-CM | POA: Diagnosis present

## 2019-08-04 DIAGNOSIS — N186 End stage renal disease: Secondary | ICD-10-CM | POA: Diagnosis present

## 2019-08-04 DIAGNOSIS — I469 Cardiac arrest, cause unspecified: Secondary | ICD-10-CM | POA: Diagnosis not present

## 2019-08-04 DIAGNOSIS — N2581 Secondary hyperparathyroidism of renal origin: Secondary | ICD-10-CM | POA: Diagnosis present

## 2019-08-04 DIAGNOSIS — R569 Unspecified convulsions: Secondary | ICD-10-CM | POA: Diagnosis present

## 2019-08-04 DIAGNOSIS — E440 Moderate protein-calorie malnutrition: Secondary | ICD-10-CM | POA: Diagnosis present

## 2019-08-04 DIAGNOSIS — I509 Heart failure, unspecified: Secondary | ICD-10-CM

## 2019-08-04 DIAGNOSIS — I5031 Acute diastolic (congestive) heart failure: Secondary | ICD-10-CM | POA: Diagnosis present

## 2019-08-04 DIAGNOSIS — J9601 Acute respiratory failure with hypoxia: Secondary | ICD-10-CM | POA: Diagnosis not present

## 2019-08-04 DIAGNOSIS — F329 Major depressive disorder, single episode, unspecified: Secondary | ICD-10-CM | POA: Diagnosis present

## 2019-08-04 DIAGNOSIS — T8130XA Disruption of wound, unspecified, initial encounter: Secondary | ICD-10-CM | POA: Diagnosis not present

## 2019-08-04 DIAGNOSIS — I468 Cardiac arrest due to other underlying condition: Secondary | ICD-10-CM | POA: Diagnosis not present

## 2019-08-04 DIAGNOSIS — T82898A Other specified complication of vascular prosthetic devices, implants and grafts, initial encounter: Secondary | ICD-10-CM | POA: Diagnosis not present

## 2019-08-04 DIAGNOSIS — Z66 Do not resuscitate: Secondary | ICD-10-CM | POA: Diagnosis not present

## 2019-08-04 DIAGNOSIS — I252 Old myocardial infarction: Secondary | ICD-10-CM | POA: Diagnosis not present

## 2019-08-04 DIAGNOSIS — L7682 Other postprocedural complications of skin and subcutaneous tissue: Secondary | ICD-10-CM | POA: Diagnosis not present

## 2019-08-04 DIAGNOSIS — I739 Peripheral vascular disease, unspecified: Secondary | ICD-10-CM | POA: Diagnosis present

## 2019-08-04 DIAGNOSIS — T82868A Thrombosis of vascular prosthetic devices, implants and grafts, initial encounter: Secondary | ICD-10-CM | POA: Diagnosis not present

## 2019-08-04 DIAGNOSIS — Z882 Allergy status to sulfonamides status: Secondary | ICD-10-CM | POA: Diagnosis not present

## 2019-08-04 DIAGNOSIS — Z20828 Contact with and (suspected) exposure to other viral communicable diseases: Secondary | ICD-10-CM | POA: Diagnosis present

## 2019-08-04 LAB — MRSA PCR SCREENING: MRSA by PCR: NEGATIVE

## 2019-08-04 LAB — RENAL FUNCTION PANEL
Albumin: 2.7 g/dL — ABNORMAL LOW (ref 3.5–5.0)
Anion gap: 14 (ref 5–15)
BUN: 18 mg/dL (ref 6–20)
CO2: 26 mmol/L (ref 22–32)
Calcium: 8.3 mg/dL — ABNORMAL LOW (ref 8.9–10.3)
Chloride: 99 mmol/L (ref 98–111)
Creatinine, Ser: 2.86 mg/dL — ABNORMAL HIGH (ref 0.61–1.24)
GFR calc Af Amer: 29 mL/min — ABNORMAL LOW (ref >60)
GFR calc non Af Amer: 25 mL/min — ABNORMAL LOW (ref >60)
Glucose, Bld: 82 mg/dL (ref 70–99)
Phosphorus: 3.9 mg/dL (ref 2.5–4.6)
Potassium: 3.4 mmol/L — ABNORMAL LOW (ref 3.5–5.1)
Sodium: 139 mmol/L (ref 135–145)

## 2019-08-04 LAB — CBC WITH DIFFERENTIAL/PLATELET
Abs Immature Granulocytes: 0.05 10*3/uL (ref 0.00–0.07)
Basophils Absolute: 0.1 10*3/uL (ref 0.0–0.1)
Basophils Relative: 1 %
Eosinophils Absolute: 0.4 10*3/uL (ref 0.0–0.5)
Eosinophils Relative: 4 %
HCT: 28.8 % — ABNORMAL LOW (ref 39.0–52.0)
Hemoglobin: 9.2 g/dL — ABNORMAL LOW (ref 13.0–17.0)
Immature Granulocytes: 1 %
Lymphocytes Relative: 11 %
Lymphs Abs: 1.1 10*3/uL (ref 0.7–4.0)
MCH: 31.9 pg (ref 26.0–34.0)
MCHC: 31.9 g/dL (ref 30.0–36.0)
MCV: 100 fL (ref 80.0–100.0)
Monocytes Absolute: 0.5 10*3/uL (ref 0.1–1.0)
Monocytes Relative: 5 %
Neutro Abs: 8 10*3/uL — ABNORMAL HIGH (ref 1.7–7.7)
Neutrophils Relative %: 78 %
Platelets: 150 10*3/uL (ref 150–400)
RBC: 2.88 MIL/uL — ABNORMAL LOW (ref 4.22–5.81)
RDW: 16.8 % — ABNORMAL HIGH (ref 11.5–15.5)
WBC: 10.1 10*3/uL (ref 4.0–10.5)
nRBC: 0 % (ref 0.0–0.2)

## 2019-08-04 LAB — ECHOCARDIOGRAM COMPLETE
Height: 61 in
Weight: 1530.87 oz

## 2019-08-04 LAB — CBC
HCT: 29.2 % — ABNORMAL LOW (ref 39.0–52.0)
Hemoglobin: 9.2 g/dL — ABNORMAL LOW (ref 13.0–17.0)
MCH: 31.5 pg (ref 26.0–34.0)
MCHC: 31.5 g/dL (ref 30.0–36.0)
MCV: 100 fL (ref 80.0–100.0)
Platelets: 146 10*3/uL — ABNORMAL LOW (ref 150–400)
RBC: 2.92 MIL/uL — ABNORMAL LOW (ref 4.22–5.81)
RDW: 17 % — ABNORMAL HIGH (ref 11.5–15.5)
WBC: 10.2 10*3/uL (ref 4.0–10.5)
nRBC: 0 % (ref 0.0–0.2)

## 2019-08-04 LAB — LACTIC ACID, PLASMA
Lactic Acid, Venous: 2.6 mmol/L (ref 0.5–1.9)
Lactic Acid, Venous: 1.9 mmol/L (ref 0.5–1.9)

## 2019-08-04 LAB — HIV ANTIBODY (ROUTINE TESTING W REFLEX): HIV Screen 4th Generation wRfx: NONREACTIVE

## 2019-08-04 LAB — CREATININE, SERUM
Creatinine, Ser: 1.97 mg/dL — ABNORMAL HIGH (ref 0.61–1.24)
GFR calc Af Amer: 46 mL/min — ABNORMAL LOW (ref >60)
GFR calc non Af Amer: 40 mL/min — ABNORMAL LOW (ref >60)

## 2019-08-04 LAB — GLUCOSE, CAPILLARY
Glucose-Capillary: 101 mg/dL — ABNORMAL HIGH (ref 70–99)
Glucose-Capillary: 57 mg/dL — ABNORMAL LOW (ref 70–99)

## 2019-08-04 LAB — STREP PNEUMONIAE URINARY ANTIGEN: Strep Pneumo Urinary Antigen: NEGATIVE

## 2019-08-04 LAB — HEPATITIS B SURFACE ANTIGEN: Hepatitis B Surface Ag: NONREACTIVE

## 2019-08-04 MED ORDER — APIXABAN 2.5 MG PO TABS
2.5000 mg | ORAL_TABLET | Freq: Two times a day (BID) | ORAL | Status: DC
  Administered 2019-08-04 (×2): 2.5 mg via ORAL
  Filled 2019-08-04 (×5): qty 1

## 2019-08-04 MED ORDER — LORATADINE 10 MG PO TABS: 10.0000 mg | ORAL_TABLET | Freq: Every day | ORAL | Status: DC

## 2019-08-04 MED ORDER — PROMETHAZINE HCL 25 MG/ML IJ SOLN
12.5000 mg | Freq: Four times a day (QID) | INTRAMUSCULAR | Status: DC | PRN
  Administered 2019-08-04: 12.5 mg via INTRAVENOUS
  Filled 2019-08-04: qty 1

## 2019-08-04 MED ORDER — FUROSEMIDE 10 MG/ML IJ SOLN
60.0000 mg | Freq: Two times a day (BID) | INTRAMUSCULAR | Status: DC
  Administered 2019-08-04: 60 mg via INTRAVENOUS
  Filled 2019-08-04: qty 8

## 2019-08-04 MED ORDER — ONDANSETRON HCL 4 MG/2ML IJ SOLN
4.0000 mg | Freq: Four times a day (QID) | INTRAMUSCULAR | Status: DC | PRN
  Administered 2019-08-04 – 2019-08-12 (×5): 4 mg via INTRAVENOUS
  Filled 2019-08-04 (×4): qty 2

## 2019-08-04 MED ORDER — GABAPENTIN 100 MG PO CAPS
100.0000 mg | ORAL_CAPSULE | Freq: Three times a day (TID) | ORAL | Status: DC
  Administered 2019-08-04 (×2): 100 mg via ORAL
  Filled 2019-08-04 (×5): qty 1

## 2019-08-04 MED ORDER — METOPROLOL TARTRATE 5 MG/5ML IV SOLN
5.0000 mg | Freq: Once | INTRAVENOUS | Status: AC
  Administered 2019-08-04: 5 mg via INTRAVENOUS

## 2019-08-04 MED ORDER — CARVEDILOL 12.5 MG PO TABS
25.0000 mg | ORAL_TABLET | Freq: Two times a day (BID) | ORAL | Status: DC
  Administered 2019-08-04: 25 mg via ORAL
  Filled 2019-08-04: qty 2

## 2019-08-04 MED ORDER — CHLORHEXIDINE GLUCONATE CLOTH 2 % EX PADS
6.0000 | MEDICATED_PAD | Freq: Every day | CUTANEOUS | Status: DC
  Administered 2019-08-05 – 2019-08-14 (×6): 6 via TOPICAL
  Filled 2019-08-04: qty 6

## 2019-08-04 MED ORDER — METOPROLOL TARTRATE 5 MG/5ML IV SOLN
INTRAVENOUS | Status: AC
  Filled 2019-08-04: qty 5

## 2019-08-04 MED ORDER — SEVELAMER CARBONATE 800 MG PO TABS
1600.0000 mg | ORAL_TABLET | Freq: Three times a day (TID) | ORAL | Status: DC
  Administered 2019-08-04: 1600 mg via ORAL
  Filled 2019-08-04 (×4): qty 2

## 2019-08-04 MED ORDER — POTASSIUM CHLORIDE 20 MEQ PO PACK
40.0000 meq | PACK | Freq: Once | ORAL | Status: AC
  Administered 2019-08-04: 40 meq via ORAL
  Filled 2019-08-04: qty 2

## 2019-08-04 MED ORDER — HYDRALAZINE HCL 50 MG PO TABS
25.0000 mg | ORAL_TABLET | Freq: Three times a day (TID) | ORAL | Status: DC
  Administered 2019-08-04 (×2): 25 mg via ORAL
  Filled 2019-08-04 (×2): qty 1

## 2019-08-04 MED ORDER — ZOLPIDEM TARTRATE 5 MG PO TABS
5.0000 mg | ORAL_TABLET | Freq: Every evening | ORAL | Status: DC | PRN
  Filled 2019-08-04: qty 1

## 2019-08-04 MED ORDER — OXYCODONE HCL 5 MG PO TABS
5.0000 mg | ORAL_TABLET | Freq: Four times a day (QID) | ORAL | Status: DC | PRN
  Administered 2019-08-04 (×2): 5 mg via ORAL
  Filled 2019-08-04 (×2): qty 1

## 2019-08-04 MED ORDER — SODIUM CHLORIDE 0.9% FLUSH
3.0000 mL | Freq: Two times a day (BID) | INTRAVENOUS | Status: DC
  Administered 2019-08-04 – 2019-08-09 (×8): 3 mL via INTRAVENOUS

## 2019-08-04 MED ORDER — LIDOCAINE HCL (PF) 1 % IJ SOLN
5.0000 mL | INTRAMUSCULAR | Status: DC | PRN
  Filled 2019-08-04: qty 5

## 2019-08-04 MED ORDER — LABETALOL HCL 5 MG/ML IV SOLN
20.0000 mg | INTRAVENOUS | Status: DC | PRN
  Administered 2019-08-08 – 2019-08-13 (×5): 20 mg via INTRAVENOUS
  Filled 2019-08-04 (×6): qty 4

## 2019-08-04 MED ORDER — PENTAFLUOROPROP-TETRAFLUOROETH EX AERO
1.0000 "application " | INHALATION_SPRAY | CUTANEOUS | Status: DC | PRN
  Filled 2019-08-04: qty 30

## 2019-08-04 MED ORDER — SODIUM CHLORIDE 0.9 % IV SOLN
2.0000 g | INTRAVENOUS | Status: DC
  Administered 2019-08-04: 2 g via INTRAVENOUS
  Filled 2019-08-04 (×2): qty 20

## 2019-08-04 MED ORDER — ASPIRIN EC 81 MG PO TBEC
81.0000 mg | DELAYED_RELEASE_TABLET | Freq: Every day | ORAL | Status: DC
  Administered 2019-08-04: 81 mg via ORAL
  Filled 2019-08-04: qty 1

## 2019-08-04 MED ORDER — RENA-VITE PO TABS
1.0000 | ORAL_TABLET | Freq: Every day | ORAL | Status: DC
  Administered 2019-08-04: 1 via ORAL
  Filled 2019-08-04 (×2): qty 1

## 2019-08-04 MED ORDER — HEPARIN SODIUM (PORCINE) 5000 UNIT/ML IJ SOLN
5000.0000 [IU] | Freq: Two times a day (BID) | INTRAMUSCULAR | Status: DC
  Administered 2019-08-04: 5000 [IU] via SUBCUTANEOUS
  Filled 2019-08-04: qty 1

## 2019-08-04 MED ORDER — SODIUM CHLORIDE 0.9 % IV SOLN: 100.0000 mL | INTRAVENOUS | Status: DC | PRN

## 2019-08-04 MED ORDER — SODIUM CHLORIDE 0.9 % IV SOLN: 250.0000 mL | INTRAVENOUS | Status: DC | PRN

## 2019-08-04 MED ORDER — ASPIRIN EC 81 MG PO TBEC: 81.0000 mg | DELAYED_RELEASE_TABLET | Freq: Every day | ORAL | Status: DC

## 2019-08-04 MED ORDER — HEPARIN SODIUM (PORCINE) 1000 UNIT/ML DIALYSIS
1000.0000 [IU] | INTRAMUSCULAR | Status: DC | PRN
  Filled 2019-08-04: qty 1

## 2019-08-04 MED ORDER — LIDOCAINE-PRILOCAINE 2.5-2.5 % EX CREA
1.0000 "application " | TOPICAL_CREAM | CUTANEOUS | Status: DC | PRN
  Filled 2019-08-04: qty 5

## 2019-08-04 MED ORDER — ACETAMINOPHEN 325 MG PO TABS
650.0000 mg | ORAL_TABLET | ORAL | Status: DC | PRN
  Administered 2019-08-08 – 2019-08-13 (×3): 650 mg via ORAL
  Filled 2019-08-04 (×4): qty 2

## 2019-08-04 MED ORDER — NEPRO/CARBSTEADY PO LIQD: 237.0000 mL | Freq: Two times a day (BID) | ORAL | Status: DC

## 2019-08-04 MED ORDER — ALTEPLASE 2 MG IJ SOLR
2.0000 mg | Freq: Once | INTRAMUSCULAR | Status: DC | PRN
  Filled 2019-08-04: qty 2

## 2019-08-04 MED ORDER — SODIUM CHLORIDE 0.9% FLUSH: 3.0000 mL | INTRAVENOUS | Status: DC | PRN

## 2019-08-04 MED ORDER — SODIUM CHLORIDE 0.9 % IV SOLN
500.0000 mg | INTRAVENOUS | Status: DC
  Administered 2019-08-04 – 2019-08-05 (×2): 500 mg via INTRAVENOUS
  Filled 2019-08-04 (×2): qty 500

## 2019-08-04 MED ORDER — AMLODIPINE BESYLATE 10 MG PO TABS
10.0000 mg | ORAL_TABLET | Freq: Every day | ORAL | Status: DC
  Administered 2019-08-04: 10 mg via ORAL
  Filled 2019-08-04: qty 1

## 2019-08-04 MED ORDER — RENA-VITE PO TABS
1.0000 | ORAL_TABLET | Freq: Every day | ORAL | Status: DC
  Filled 2019-08-04: qty 1

## 2019-08-04 MED ORDER — HYDROXYZINE HCL 25 MG PO TABS
25.0000 mg | ORAL_TABLET | Freq: Three times a day (TID) | ORAL | Status: DC | PRN
  Filled 2019-08-04 (×2): qty 1

## 2019-08-04 MED ORDER — ATORVASTATIN CALCIUM 20 MG PO TABS
80.0000 mg | ORAL_TABLET | Freq: Every day | ORAL | Status: DC
  Administered 2019-08-04: 80 mg via ORAL
  Filled 2019-08-04: qty 4

## 2019-08-04 NOTE — ED Notes (Signed)
ED TO INPATIENT HANDOFF REPORT  ED Nurse Name and Phone #: Tadan Shill S Name/Age/Gender Brett Small 43 y.o. male Room/Bed: ED17A/ED17A  Code Status   Code Status: Full Code  Home/SNF/Other Home Patient oriented to: self, place, time and situation Is this baseline? Yes   Triage Complete: Triage complete  Chief Complaint sob  Triage Note Pt via EMS from home with c/o resp distress. Pt was placed on cpap in route, pt severely  Diaphoretic, and tachpnea. MD and RT at bedside    Allergies Allergies  Allergen Reactions  . Codeine Nausea Only    Patient questioned this (??)  . Sulfa Antibiotics Hives and Nausea And Vomiting    Level of Care/Admitting Diagnosis ED Disposition    ED Disposition Condition [facility]: [place] [[id number]]  Level of Care: Stepdown [14]  Covid Evaluation: Confirmed COVID Negative  Diagnosis: Acute CHF (congestive heart failure) [facility]) [id number]  Admitting Physician: Christel Mormon [id number]  Attending Physician: Christel Mormon [id number]  Estimated length of stay: 3 - 4 days  Certification:: I certify this patient will need inpatient services for at least 2 midnights  PT Class (Do Not Modify): Inpatient [101]  PT Acc Code (Do Not Modify): Private [1]       B Medical/Surgery History Past Medical History:  Diagnosis Date  . ESRD (end stage renal disease) ([place])   . Hypertension   . PAD (peripheral artery disease) (H[facility])    Required aortofemoral stent-which had closed and had to redo the procedure and ischemia of limb.  . Peripheral vascular disease ([place])   . Renal disorder   . Secondary hyperparathyroidism of renal origin [facility])    Past Surgical History:  Procedure Laterality Date  . A/V SHUNT INTERVENTION Left 01/19/2019   Procedure: LEFT UPPER EXTREMITY A/V SHUNTOGRAM / UPPER EXTREMITY ANGIOGRAM;  Surgeon: Algernon Huxley, MD;  Location: [place] CV LAB;  Service: Cardiovascular;  Laterality: Left;  .  A/V SHUNT INTERVENTION Left 03/02/2019   Procedure: A/V SHUNT INTERVENTION;  Surgeon: Algernon Huxley, MD;  Location: [place] CV LAB;  Service: Cardiovascular;  Laterality: Left;  . AORTA - FEMORAL ARTERY BYPASS GRAFT    . AV FISTULA PLACEMENT Left 12/26/2018   Procedure: INSERTION OF GORE STRETCH VASCULAR 4-7MM X  45CM IN LEFT UPPER ARM;  Surgeon: Marty Heck, MD;  Location: [place];  Service: Vascular;  Laterality: Left;  . DIALYSIS/PERMA CATHETER REMOVAL N/A 02/06/2019   Procedure: DIALYSIS/PERMA CATHETER REMOVAL;  Surgeon: Algernon Huxley, MD;  Location: [place] CV LAB;  Service: Cardiovascular;  Laterality: N/A;  . LEFT HEART CATH AND CORONARY ANGIOGRAPHY Right 07/10/2019   Procedure: LEFT HEART CATH AND CORONARY ANGIOGRAPHY;  Surgeon: Dionisio David, MD;  Location: [place] CV LAB;  Service: Cardiovascular;  Laterality: Right;  . PERIPHERAL VASCULAR THROMBECTOMY Left 07/28/2019   Procedure: Left Upper Extremity Dialysis Access [place];  Surgeon: Katha Cabal, MD;  Location: [place] CV LAB;  Service: Cardiovascular;  Laterality: Left;     A IV Location/Drains/Wounds Patient Lines/Drains/Airways Status   Active Line/Drains/Airways    Name:   Placement date:   Placement time:   Site:   Days:   Peripheral IV 08/03/19 Right Hand   08/03/19    0916    Hand   1   Peripheral IV 08/03/19 Right Forearm   08/03/19    0943    Forearm   1   Fistula / Graft  Left Upper arm Arteriovenous vein graft   12/26/18    1024    Upper arm   221   Hemodialysis Catheter Right Internal jugular Double lumen Temporary (Non-Tunneled)   -    -    Internal jugular      Wound / Incision (Open or Dehisced) 05/19/19 Ankle Right dry, scabbed   05/19/19    0620    Ankle   77          Intake/Output Last 24 hours No intake or output data in the 24 hours ending 08/04/19 0244  Labs/Imaging Results for orders placed or performed during the hospital encounter of 08/03/19 (from the past 48 hour(s))   CBC with Differential     Status: Abnormal   Collection Time: 08/03/19  9:25 AM  Result Value Ref Range   WBC 17.2 (H) 4.0 - 10.5 K/uL   RBC 3.21 (L) 4.22 - 5.81 MIL/uL   Hemoglobin 10.4 (L) 13.0 - 17.0 g/dL   HCT 33.6 (L) 39.0 - 52.0 %   MCV 104.7 (H) 80.0 - 100.0 fL   MCH 32.4 26.0 - 34.0 pg   MCHC 31.0 30.0 - 36.0 g/dL   RDW 17.3 (H) 11.5 - 15.5 %   Platelets 268 150 - 400 K/uL   nRBC 0.0 0.0 - 0.2 %   Neutrophils Relative % 81 %   Neutro Abs 14.1 (H) 1.7 - 7.7 K/uL   Lymphocytes Relative 11 %   Lymphs Abs 1.8 0.7 - 4.0 K/uL   Monocytes Relative 3 %   Monocytes Absolute 0.5 0.1 - 1.0 K/uL   Eosinophils Relative 3 %   Eosinophils Absolute 0.5 0.0 - 0.5 K/uL   Basophils Relative 1 %   Basophils Absolute 0.2 (H) 0.0 - 0.1 K/uL   Immature Granulocytes 1 %   Abs Immature Granulocytes 0.11 (H) 0.00 - 0.07 K/uL    Comment: Performed at [facility], [address] [place], [place] [address]  Brain natriuretic peptide     Status: Abnormal   Collection Time: 08/03/19  9:25 AM  Result Value Ref Range   B Natriuretic Peptide >4,500.0 (H) 0.0 - 100.0 pg/mL    Comment: RESULT CONFIRMED BY MANUAL DILUTION DAS Performed at [facility], [address] [place], [place] [address]   SARS Coronavirus 2 by RT PCR (hospital order, performed in [place] hospital lab) Nasopharyngeal Nasopharyngeal Swab     Status: None   Collection Time: 08/03/19  9:25 AM   Specimen: Nasopharyngeal Swab  Result Value Ref Range   SARS Coronavirus 2 NEGATIVE NEGATIVE    Comment: (NOTE) If result is NEGATIVE SARS-CoV-2 target nucleic acids are NOT DETECTED. The SARS-CoV-2 RNA is generally detectable in upper and lower  respiratory specimens during the acute phase of infection. The lowest  concentration of SARS-CoV-2 viral copies this assay can detect is 250  copies / mL. A negative result does not preclude SARS-CoV-2 infection  and should not be used as the sole basis for treatment or  other  patient management decisions.  A negative result may occur with  improper specimen collection / handling, submission of specimen other  than nasopharyngeal swab, presence of viral mutation(s) within the  areas targeted by this assay, and inadequate number of viral copies  (<250 copies / mL). A negative result must be combined with clinical  observations, patient history, and epidemiological information. If result is POSITIVE SARS-CoV-2 target nucleic acids are DETECTED. The SARS-CoV-2 RNA is generally detectable  in upper and lower  respiratory specimens dur ing the acute phase of infection.  Positive  results are indicative of active infection with SARS-CoV-2.  Clinical  correlation with patient history and other diagnostic information is  necessary to determine patient infection status.  Positive results do  not rule out bacterial infection or co-infection with other viruses. If result is PRESUMPTIVE POSTIVE SARS-CoV-2 nucleic acids MAY BE PRESENT.   A presumptive positive result was obtained on the submitted specimen  and confirmed on repeat testing.  While 2019 novel coronavirus  (SARS-CoV-2) nucleic acids may be present in the submitted sample  additional confirmatory testing may be necessary for epidemiological  and / or clinical management purposes  to differentiate between  SARS-CoV-2 and other Sarbecovirus currently known to infect humans.  If clinically indicated additional testing with an alternate test  methodology [phone]) is advised. The SARS-CoV-2 RNA is generally  detectable in upper and lower respiratory sp ecimens during the acute  phase of infection. The expected result is Negative. Fact Sheet for Patients:  StrictlyIdeas.no Fact Sheet for Healthcare Providers: BankingDealers.co.za This test is not yet approved or cleared by the [place] FDA and has been authorized for detection and/or diagnosis of  SARS-CoV-2 by FDA under an Emergency Use Authorization (EUA).  This EUA will remain in effect (meaning this test can be used) for the duration of the COVID-19 declaration under Section 564(b)(1) of the Act, 21 U.S.C. section 360bbb-3(b)(1), unless the authorization is terminated or revoked sooner. Performed at [facility], [address] [place], [place] [address]   Comprehensive metabolic panel     Status: Abnormal   Collection Time: 08/03/19  9:25 AM  Result Value Ref Range   Sodium 134 (L) 135 - 145 mmol/L   Potassium 3.2 (L) 3.5 - 5.1 mmol/L   Chloride 100 98 - 111 mmol/L   CO2 21 (L) 22 - 32 mmol/L   Glucose, Bld 305 (H) 70 - 99 mg/dL   BUN 29 (H) 6 - 20 mg/dL   Creatinine, Ser 4.19 (H) 0.61 - 1.24 mg/dL   Calcium 8.4 (L) 8.9 - 10.3 mg/dL   Total Protein 6.4 (L) 6.5 - 8.1 g/dL   Albumin 2.9 (L) 3.5 - 5.0 g/dL   AST 28 15 - 41 U/L   ALT 11 0 - 44 U/L   Alkaline Phosphatase 73 38 - 126 U/L   Total Bilirubin 1.5 (H) 0.3 - 1.2 mg/dL   GFR calc non Af Amer 16 (L) >60 mL/min   GFR calc Af Amer 19 (L) >60 mL/min   Anion gap 13 5 - 15    Comment: Performed at [facility], [place], [place] [address]  Troponin I (High Sensitivity)     Status: Abnormal   Collection Time: 08/03/19  9:25 AM  Result Value Ref Range   Troponin I (High Sensitivity) 22 (H) <18 ng/L    Comment: (NOTE) Elevated high sensitivity troponin I (hsTnI) values and significant  changes across serial measurements may suggest ACS but many other  chronic and acute conditions are known to elevate hsTnI results.  Refer to the "Links" section for chest pain algorithms and additional  guidance. Performed at [facility], [place], [place] [address]   Lactic acid, plasma     Status: Abnormal   Collection Time: 08/03/19  9:25 AM  Result Value Ref Range   Lactic Acid, Venous 4.1 (HH) 0.5 - 1.9 mmol/L    Comment:  CRITICAL RESULT CALLED TO, READ BACK BY AND  VERIFIED WITH DR.ISAACS AT 1032 08/03/2019 DAS Performed at [facility], [address] [place], [place] [address]   Blood gas, venous     Status: Abnormal   Collection Time: 08/03/19  9:27 AM  Result Value Ref Range   pH, Ven 7.28 7.250 - 7.430   pCO2, Ven 42 (L) 44.0 - 60.0 mmHg   pO2, Ven 54.0 (H) 32.0 - 45.0 mmHg   Patient temperature 37.0    Collection site VENOUS    Sample type VENOUS     Comment: Performed at [facility], [address], [place], [place] [address]  Troponin I (High Sensitivity)     Status: Abnormal   Collection Time: 08/03/19 11:19 AM  Result Value Ref Range   Troponin I (High Sensitivity) 47 (H) <18 ng/L    Comment: READ BACK AND VERIFIED WITH KAILEY WALKER AT [id number] ON 08/03/2019 [facility]. (NOTE) Elevated high sensitivity troponin I (hsTnI) values and significant  changes across serial measurements may suggest ACS but many other  chronic and acute conditions are known to elevate hsTnI results.  Refer to the "Links" section for chest pain algorithms and additional  guidance. Performed at [facility], [address] [place], [place] [address]   CBC     Status: Abnormal   Collection Time: 08/04/19  1:51 AM  Result Value Ref Range   WBC 10.2 4.0 - 10.5 K/uL   RBC 2.92 (L) 4.22 - 5.81 MIL/uL   Hemoglobin 9.2 (L) 13.0 - 17.0 g/dL   HCT 29.2 (L) 39.0 - 52.0 %   MCV 100.0 80.0 - 100.0 fL   MCH 31.5 26.0 - 34.0 pg   MCHC 31.5 30.0 - 36.0 g/dL   RDW 17.0 (H) 11.5 - 15.5 %   Platelets 146 (L) 150 - 400 K/uL   nRBC 0.0 0.0 - 0.2 %    Comment: Performed at [facility], [address] [place], [place] [address]  Creatinine, serum     Status: Abnormal   Collection Time: 08/04/19  1:51 AM  Result Value Ref Range   Creatinine, Ser 1.97 (H) 0.61 - 1.24 mg/dL   GFR calc non Af Amer 40 (L) >60 mL/min   GFR calc Af Amer 46 (L) >60 mL/min    Comment: Performed at [facility], [address] [place], [place]  [address]   Dg Chest Portable 1 View  Result Date: 08/03/2019 CLINICAL DATA:  Respiratory distress. EXAM: PORTABLE CHEST 1 VIEW COMPARISON:  October 5th 2020. FINDINGS: Stable cardiomegaly. No pneumothorax is noted. Increased bilateral perihilar and basilar opacities are noted concerning for worsening edema or possibly pneumonia. Small left pleural effusion is noted. Stable right internal jugular dialysis catheter is noted. Bony thorax is unremarkable. IMPRESSION: Increased bilateral lung opacities are noted concerning for worsening edema or possibly pneumonia. Small left pleural effusion is noted. Electronically Signed   By: Marijo Conception M.D.   On: 08/03/2019 09:43   US Abdomen Limited Ruq  Result Date: 08/03/2019 CLINICAL DATA:  Right upper quadrant pain.  End-stage renal disease. EXAM: ULTRASOUND ABDOMEN LIMITED RIGHT UPPER QUADRANT COMPARISON:  07/20/2019 FINDINGS: Gallbladder: No gallstones or wall thickening visualized. No sonographic Murphy sign noted by sonographer. Common bile duct: Diameter: 4 mm Liver: No focal lesion identified. Within normal limits in parenchymal echogenicity. Portal vein is patent on color Doppler imaging with normal direction of blood flow towards the liver. Other: Bilateral pleural effusions and  small to moderate volume ascites within the visualized abdomen. IMPRESSION: 1. Unremarkable sonographic appearance of the liver and gallbladder. 2. Small to moderate volume ascites. 3. Bilateral pleural effusions. Electronically Signed   By: Davina Poke M.D.   On: 08/03/2019 12:51    Pending Labs Unresulted Labs (From admission, onward)    Start     Ordered   08/05/19 [id number]  Basic metabolic panel  Daily,   STAT     08/04/19 0127   08/04/19 0500  CBC WITH DIFFERENTIAL  Once,   STAT     08/04/19 0127   08/04/19 0243  Legionella Pneumophila Serogp 1 Ur Ag  Once,   STAT     08/04/19 0242   08/04/19 0243  Strep pneumoniae urinary antigen  Once,   STAT     08/04/19 0242    08/04/19 0243  Mycoplasma pneumoniae antibody, IgM  Once,   STAT     08/04/19 0242   08/04/19 0126  Sputum culture  (Non-severe pneumonia (non-ICU care) in adult without resistant organism risk factors.)  Once,   STAT     08/04/19 0127   08/04/19 0114  HIV Antibody (routine testing w rflx)  (HIV Antibody (Routine testing w reflex) panel)  Once,   STAT     08/04/19 0127   08/04/19 0114  HIV4GL Save Tube  (HIV Antibody (Routine testing w reflex) panel)  Once,   STAT     08/04/19 0127   08/03/19 1654  Hepatitis B surface antigen  Once,   STAT     08/03/19 1654   08/03/19 0956  MRSA PCR Screening  ONCE - STAT,   STAT     08/03/19 0956   08/03/19 0947  Blood culture (routine x 2)  BLOOD CULTURE X 2,   STAT,   Status:  Canceled     08/03/19 0946          Vitals/Pain Today's Vitals   08/04/19 0100 08/04/19 0115 08/04/19 0130 08/04/19 0145  BP: (!) 147/103 (!) 144/106 132/83 (!) 144/92  Pulse: 91 92 97 98  Resp: 16 (P) 16    Temp:      TempSrc:      SpO2: 100% 100% 99% 100%  PainSc: 0-No pain (P) 0-No pain      Isolation Precautions Airborne and Contact precautions  Medications Medications  nitroGLYCERIN 50 mg in dextrose 5 % 250 mL (0.2 mg/mL) infusion (0 mcg/min Intravenous Stopped 08/03/19 1048)  nitroGLYCERIN (NITROSTAT) SL tablet 0.4 mg (0.4 mg Sublingual Given 08/03/19 0919)  aspirin EC tablet 81 mg (has no administration in time range)  amLODipine (NORVASC) tablet 10 mg (has no administration in time range)  atorvastatin (LIPITOR) tablet 80 mg (has no administration in time range)  carvedilol (COREG) tablet 25 mg (has no administration in time range)  hydrALAZINE (APRESOLINE) tablet 25 mg (has no administration in time range)  hydrOXYzine (ATARAX/VISTARIL) tablet 25 mg (has no administration in time range)  sevelamer carbonate (RENVELA) tablet 1,600 mg (has no administration in time range)  gabapentin (NEURONTIN) capsule 100 mg (has no administration in time range)   apixaban (ELIQUIS) tablet 2.5 mg (has no administration in time range)  multivitamin (RENA-VIT) tablet 1 tablet (has no administration in time range)  multivitamin (RENA-VIT) tablet 1 tablet (has no administration in time range)  feeding supplement (NEPRO CARB STEADY) liquid 237 mL (has no administration in time range)  loratadine (CLARITIN) tablet 10 mg (has no administration in time range)  sodium chloride flush (  NS) 0.9 % injection 3 mL (has no administration in time range)  sodium chloride flush (NS) 0.9 % injection 3 mL (has no administration in time range)  0.9 %  sodium chloride infusion (has no administration in time range)  acetaminophen (TYLENOL) tablet 650 mg (has no administration in time range)  ondansetron (ZOFRAN) injection 4 mg (has no administration in time range)  heparin injection 5,000 Units (has no administration in time range)  furosemide (LASIX) injection 60 mg (has no administration in time range)  zolpidem (AMBIEN) tablet 5 mg (has no administration in time range)  cefTRIAXone (ROCEPHIN) 2 g in sodium chloride 0.9 % 100 mL IVPB (has no administration in time range)  azithromycin (ZITHROMAX) 500 mg in sodium chloride 0.9 % 250 mL IVPB (has no administration in time range)  fentaNYL (SUBLIMAZE) injection 50 mcg (50 mcg Intravenous Given 08/03/19 0929)  ceFEPIme (MAXIPIME) 2 g in sodium chloride 0.9 % 100 mL IVPB (0 g Intravenous Stopped 08/03/19 1131)  vancomycin (VANCOCIN) IVPB 1000 mg/200 mL premix (0 mg Intravenous Stopped 08/03/19 1237)  ondansetron (ZOFRAN) injection 4 mg (4 mg Intravenous Given 08/03/19 0945)  ondansetron (ZOFRAN) injection 4 mg (4 mg Intravenous Given 08/03/19 1148)    Mobility walks Moderate fall risk   Focused Assessments Cardiac Assessment Handoff:  Cardiac Rhythm: Normal sinus rhythm Lab Results  Component Value Date   TROPONINI 0.07 ([place]) 03/01/2019   No results found for: DDIMER Does the Patient currently have chest pain? No      R Recommendations: See Admitting Provider Note  Report given to:   Additional Notes: none

## 2019-08-04 NOTE — Progress Notes (Signed)
Post HD:    08/04/19 0130  Vital Signs  Temp 98 F (36.7 C)  Temp Source Oral  Pulse Rate 97  Pulse Rate Source Monitor  Resp 16  BP 132/83  BP Location Right Arm  BP Method Automatic  Patient Position (if appropriate) Lying  Oxygen Therapy  SpO2 99 %  O2 Device Bi-PAP  Pain Assessment  Pain Scale 0-10  Pain Score 0  Post-Hemodialysis Assessment  Rinseback Volume (mL) 250 mL  KECN 67.6 V  Dialyzer Clearance Lightly streaked  Duration of HD Treatment -hour(s) 3 hour(s)  Hemodialysis Intake (mL) 500 mL  UF Total -Machine (mL) 2500 mL  Net UF (mL) 2000 mL  Tolerated HD Treatment Yes  AVG/AVF Arterial Site Held (minutes)  (n/a)  AVG/AVF Venous Site Held (minutes)  (n/a)  Education / Care Plan  Dialysis Education Provided Yes  Documented Education in Care Plan Yes  Outpatient Plan of Care Reviewed and on Chart Yes  Hemodialysis Catheter Right Internal jugular Double lumen Temporary (Non-Tunneled)  No Placement Date or Time found.   Placed prior to admission: Yes  Orientation: Right  Access Location: Internal jugular  Hemodialysis Catheter Type: Double lumen Temporary (Non-Tunneled)  Site Condition No complications  Blue Lumen Status Heparin locked  Red Lumen Status Heparin locked  Purple Lumen Status N/A  Catheter fill solution Heparin 1000 units/ml  Catheter fill volume (Arterial) 2.2 cc  Catheter fill volume (Venous) 2  Dressing Type Gauze/Drain sponge  Dressing Status Intact  Drainage Description None

## 2019-08-04 NOTE — Progress Notes (Signed)
Report recieved from [facility] ED. Unable to visualize and assess patient as they are going straight to dialysis unit. Report received. Will assess patient once transferred to ICU.

## 2019-08-04 NOTE — ED Notes (Signed)
Pt taken off bipap and placed on 4 liters oxygen.

## 2019-08-04 NOTE — ED Notes (Signed)
Message sent to pharmacy regarding verifying meds due to patient being an admission hold and requesting meds sent to ED.

## 2019-08-04 NOTE — Progress Notes (Addendum)
Patient admitted early this morning.  Seen and examined by me later in the morning.  Patient states he is feeling fine.  He is still having some shortness of breath and lower extremity edema.  States he has not missed any dialysis sessions.  He denies any chest pain, cough, fevers, or chills.  He was initially requiring BiPAP, but has been transitioned to nasal cannula without any issues.  Acute hypoxic respiratory failure secondary to acute pulmonary edema in ESRD- due to volume overload state. Pneumonia is unlikely without cough, fevers, or leukocytosis. -Volume management with dialysis per nephrology  -Received HD yesterday and will receive another HD session today to keep him on his regular TTS schedule -Check procalcitonin, can stop abx if negative -ECHO ordered -Wean O2 as able  Lactic acidosis-may be due to above -Trend lactic acid  Hypertension- BP has greatly improved after dialysis -Continue home Norvasc, Coreg, hydralazine  Hyperlipidemia-stable -Continue home Lipitor  Paroxysmal atrial fibrillation -Continue home Eliquis and Coreg  Anemia of chronic kidney disease- hemoglobin at baseline -Monitor  Hyman Bible, MD

## 2019-08-04 NOTE — ED Notes (Signed)
Pt eating dinner tray now

## 2019-08-04 NOTE — ED Notes (Signed)
Pt alert.  Iv';s in place  nsr on monitor.  Pt states he feels better now.

## 2019-08-04 NOTE — Progress Notes (Signed)
Hemodialysis- Treatment completed. Report called to ICU RN. Patient to transport from HD unit. No current complaints. All vitals currently stable. Sats 99% on 3L [place]. Patient denies pain. Nausea resolved.

## 2019-08-04 NOTE — ED Notes (Signed)
Dialysis complete.  Pt waiting on admission.

## 2019-08-04 NOTE — ED Notes (Signed)
Report off to gracie  rn  

## 2019-08-04 NOTE — Progress Notes (Signed)
Post HD Assessment:    08/04/19 0129  Neurological  Level of Consciousness Alert  Orientation Level Oriented X4  Respiratory  Respiratory Pattern Labored  Cardiac  Pulse Regular  Cardiac Rhythm NSR  Vascular  R Radial Pulse +2  L Radial Pulse +2  Psychosocial  Psychosocial (WDL) WDL

## 2019-08-04 NOTE — Progress Notes (Signed)
[facility]  ROUNDING NOTE   Subjective:  Patient seen and evaluated during hemodialysis treatment. Underwent dialysis late yesterday and to early morning today. Tolerating treatment well at the moment. Off of BiPAP. Breathing quite comfortably.   Objective:  Vital signs in last 24 hours:  Temp:  [97.2 F (36.2 C)-98.7 F (37.1 C)] 98.4 F (36.9 C) (10/13 0846) Pulse Rate:  [85-135] 90 (10/13 0916) Resp:  [13-22] 16 (10/13 0916) BP: (90-163)/(71-108) 104/75 (10/13 0916) SpO2:  [93 %-100 %] 99 % (10/13 0846)  Weight change:  Filed Weights    Intake/Output: I/O last 3 completed shifts: In: 346.6 [IV Piggyback:346.6] Out: 2000 [Other:2000]   Intake/Output this shift:  No intake/output data recorded.  Physical Exam: General: No acute distress  Head: Normocephalic, atraumatic. Moist oral mucosal membranes  Eyes: Anicteric  Neck: Supple, trachea midline  Lungs:  Clear to auscultation, normal effort  Heart: S1S2 no rubs  Abdomen:  Soft, nontender, bowel sounds present  Extremities: No peripheral edema.  Neurologic: Awake, alert, following commands  Skin: No lesions  Access: LUE AVF, Right IJ PC    Basic Metabolic Panel: Recent Labs  Lab 07/29/19 0407 08/03/19 0925 08/04/19 0151  NA 139 134*  --   K 3.8 3.2*  --   CL 103 100  --   CO2 28 21*  --   GLUCOSE 91 305*  --   BUN 16 29*  --   CREATININE 2.55* 4.19* 1.97*  CALCIUM 8.5* 8.4*  --     Liver Function Tests: Recent Labs  Lab 08/03/19 0925  AST 28  ALT 11  ALKPHOS 73  BILITOT 1.5*  PROT 6.4*  ALBUMIN 2.9*   No results for input(s): LIPASE, AMYLASE in the last 168 hours. No results for input(s): AMMONIA in the last 168 hours.  CBC: Recent Labs  Lab 08/03/19 0925 08/04/19 0151 08/04/19 0349  WBC 17.2* 10.2 10.1  NEUTROABS 14.1*  --  8.0*  HGB 10.4* 9.2* 9.2*  HCT 33.6* 29.2* 28.8*  MCV 104.7* 100.0 100.0  PLT 268 146* 150    Cardiac Enzymes: No results for input(s):  CKTOTAL, CKMB, CKMBINDEX, TROPONINI in the last 168 hours.  BNP: Invalid input(s): POCBNP  CBG: Recent Labs  Lab 07/28/19 1537  GLUCAP 96    Microbiology: Results for orders placed or performed during the hospital encounter of 08/03/19  SARS Coronavirus 2 by RT PCR (hospital order, performed in [facility] hospital lab) Nasopharyngeal Nasopharyngeal Swab     Status: None   Collection Time: 08/03/19  9:25 AM   Specimen: Nasopharyngeal Swab  Result Value Ref Range Status   SARS Coronavirus 2 NEGATIVE NEGATIVE Final    Comment: (NOTE) If result is NEGATIVE SARS-CoV-2 target nucleic acids are NOT DETECTED. The SARS-CoV-2 RNA is generally detectable in upper and lower  respiratory specimens during the acute phase of infection. The lowest  concentration of SARS-CoV-2 viral copies this assay can detect is 250  copies / mL. A negative result does not preclude SARS-CoV-2 infection  and should not be used as the sole basis for treatment or other  patient management decisions.  A negative result may occur with  improper specimen collection / handling, submission of specimen other  than nasopharyngeal swab, presence of viral mutation(s) within the  areas targeted by this assay, and inadequate number of viral copies  (<250 copies / mL). A negative result must be combined with clinical  observations, patient history, and epidemiological information. If result is POSITIVE  SARS-CoV-2 target nucleic acids are DETECTED. The SARS-CoV-2 RNA is generally detectable in upper and lower  respiratory specimens dur ing the acute phase of infection.  Positive  results are indicative of active infection with SARS-CoV-2.  Clinical  correlation with patient history and other diagnostic information is  necessary to determine patient infection status.  Positive results do  not rule out bacterial infection or co-infection with other viruses. If result is PRESUMPTIVE POSTIVE SARS-CoV-2 nucleic acids MAY BE  PRESENT.   A presumptive positive result was obtained on the submitted specimen  and confirmed on repeat testing.  While 2019 novel coronavirus  (SARS-CoV-2) nucleic acids may be present in the submitted sample  additional confirmatory testing may be necessary for epidemiological  and / or clinical management purposes  to differentiate between  SARS-CoV-2 and other Sarbecovirus currently known to infect humans.  If clinically indicated additional testing with an alternate test  methodology [phone]) is advised. The SARS-CoV-2 RNA is generally  detectable in upper and lower respiratory sp ecimens during the acute  phase of infection. The expected result is Negative. Fact Sheet for Patients:  StrictlyIdeas.no Fact Sheet for Healthcare Providers: BankingDealers.co.za This test is not yet approved or cleared by the [place] FDA and has been authorized for detection and/or diagnosis of SARS-CoV-2 by FDA under an Emergency Use Authorization (EUA).  This EUA will remain in effect (meaning this test can be used) for the duration of the COVID-19 declaration under Section 564(b)(1) of the Act, 21 U.S.C. section 360bbb-3(b)(1), unless the authorization is terminated or revoked sooner. Performed at [facility], [address] [place], [place] [address]   Blood culture (routine x 2)     Status: None (Preliminary result)   Collection Time: 08/03/19 10:05 AM   Specimen: BLOOD  Result Value Ref Range Status   Specimen Description BLOOD BLOOD RIGHT FOREARM  Final   Special Requests   Final    BOTTLES DRAWN AEROBIC AND ANAEROBIC Blood Culture adequate volume   Culture   Final    NO GROWTH < 24 HOURS Performed at [facility], [address], [place], [place] [address]    Report Status PENDING  Incomplete  MRSA PCR Screening     Status: None   Collection Time: 08/04/19  7:56 AM   Specimen: Nasopharyngeal  Result Value Ref  Range Status   MRSA by PCR NEGATIVE NEGATIVE Final    Comment:        The GeneXpert MRSA Assay (FDA approved for NASAL specimens only), is one component of a comprehensive MRSA colonization surveillance program. It is not intended to diagnose MRSA infection nor to guide or monitor treatment for MRSA infections. Performed at [facility], [address] [place], [place] [address]     Coagulation Studies: No results for input(s): LABPROT, INR in the last 72 hours.  Urinalysis: No results for input(s): COLORURINE, LABSPEC, PHURINE, GLUCOSEU, HGBUR, BILIRUBINUR, KETONESUR, PROTEINUR, UROBILINOGEN, NITRITE, LEUKOCYTESUR in the last 72 hours.  Invalid input(s): APPERANCEUR    Imaging: Dg Chest Portable 1 View  Result Date: 08/03/2019 CLINICAL DATA:  Respiratory distress. EXAM: PORTABLE CHEST 1 VIEW COMPARISON:  October 5th 2020. FINDINGS: Stable cardiomegaly. No pneumothorax is noted. Increased bilateral perihilar and basilar opacities are noted concerning for worsening edema or possibly pneumonia. Small left pleural effusion is noted. Stable right internal jugular dialysis catheter is noted. Bony thorax is unremarkable. IMPRESSION: Increased bilateral lung opacities are noted concerning for worsening edema or possibly pneumonia. Small left  pleural effusion is noted. Electronically Signed   By: Marijo Conception M.D.   On: 08/03/2019 09:43   US Abdomen Limited Ruq  Result Date: 08/03/2019 CLINICAL DATA:  Right upper quadrant pain.  End-stage renal disease. EXAM: ULTRASOUND ABDOMEN LIMITED RIGHT UPPER QUADRANT COMPARISON:  07/20/2019 FINDINGS: Gallbladder: No gallstones or wall thickening visualized. No sonographic Murphy sign noted by sonographer. Common bile duct: Diameter: 4 mm Liver: No focal lesion identified. Within normal limits in parenchymal echogenicity. Portal vein is patent on color Doppler imaging with normal direction of blood flow towards the liver. Other: Bilateral  pleural effusions and small to moderate volume ascites within the visualized abdomen. IMPRESSION: 1. Unremarkable sonographic appearance of the liver and gallbladder. 2. Small to moderate volume ascites. 3. Bilateral pleural effusions. Electronically Signed   By: Davina Poke M.D.   On: 08/03/2019 12:51     Medications:   . sodium chloride    . sodium chloride    . sodium chloride    . azithromycin Stopped (08/04/19 0517)  . cefTRIAXone (ROCEPHIN)  IV Stopped (08/04/19 0657)  . nitroGLYCERIN Stopped (08/03/19 1012)   . amLODipine  10 mg Oral Q2000  . apixaban  2.5 mg Oral BID  . aspirin EC  81 mg Oral Daily  . atorvastatin  80 mg Oral q1800  . carvedilol  25 mg Oral BID WC  . Chlorhexidine Gluconate Cloth  6 each Topical Q0600  . feeding supplement (NEPRO CARB STEADY)  237 mL Oral BID BM  . furosemide  60 mg Intravenous Q12H  . gabapentin  100 mg Oral TID  . heparin  5,000 Units Subcutaneous Q12H  . hydrALAZINE  25 mg Oral Q8H  . loratadine  10 mg Oral Daily  . multivitamin  1 tablet Oral QHS  . multivitamin  1 tablet Oral Daily  . sevelamer carbonate  1,600 mg Oral TID WC  . sodium chloride flush  3 mL Intravenous Q12H   sodium chloride, sodium chloride, sodium chloride, acetaminophen, alteplase, heparin, hydrOXYzine, labetalol, lidocaine (PF), lidocaine-prilocaine, nitroGLYCERIN, ondansetron (ZOFRAN) IV, pentafluoroprop-tetrafluoroeth, sodium chloride flush, zolpidem  Assessment/ Plan:  43 y.o. male with end-stage renal disease on hemodialysis, hypertension, peripheral vascular disease, depression.  [place] TTS    1.  ESRD on HD TTS.  Patient underwent unscheduled hemodialysis treatment yesterday and completed this early this morning.  We are now dialyzing him per his usual schedule.  He appears to be tolerating treatment well.  2.  Acute respiratory failure.  Patient was on BiPAP yesterday but now off.  Breathing quite comfortably.  3.  Anemia of  chronic kidney disease.  Hemoglobin currently 9.2.  Start the patient on Epogen 4000 units IV with dialysis today.  4.  Secondary hyperparathyroidism.  Maintain the patient on Renvela 1600 mg p.o. 3 times daily.   LOS: 0 Taliah Porche 10/13/20209:40 AM

## 2019-08-04 NOTE — ED Notes (Signed)
Dialysis nurse with pt.  nsr on monitor.  Pt still on bipap.

## 2019-08-04 NOTE — ED Notes (Addendum)
NP Ouma made aware of pt HR and BP, Orders for metoprolol at thsi time

## 2019-08-04 NOTE — Progress Notes (Signed)
Established hemodialysis patient known at [facility] TTS 11:00. Patient normally rides with [facility]. Patient education done on readmissions and how we can avoid these. Patient states that he tries to watch his fluid intake on the two days off from dialysis however, he gets thirsty. I educated about tricks on how to avoid this by getting sours candy, using lemon in the water, brushing teeth and using mouth wash when mouth feels dry. I also mentioned elevating head of bed so that he is not laying flat while sleeping. Patient also noted that he thinks having a fourth treatment may help, I will inform Dr. Holley Raring and clinic of this suggestion.   [name] Dialysis Coordinator [phone]

## 2019-08-04 NOTE — ED Notes (Signed)
This RN introduced self to pt. Pt given sterile cup for sputum culture. Pt advised still waiting on a bed. Pt denies any further needs at this time. This RN apologized for and explained delay. Pt states understanding. Pt lights turned off for comfort. Pt refusing Rena-Vite PO tabs. Will continue to monitor.

## 2019-08-04 NOTE — Progress Notes (Signed)
Hemodialysis initiated via R Chest hd catheter without issue. Placed on 4k bath due to last k=3.2. Renal panel drawn and sent this am. Lungs clear/diminished. Sats 99% on 3L [place]. No distress noted. Patient to transfer to ICU bed post HD today. Continue to monitor.

## 2019-08-04 NOTE — H&P (Addendum)
[place] [facility] [place] NAME: Brett Small    MR#:  [id number]  DATE OF BIRTH:  08/07/1976  DATE OF ADMISSION:  08/03/2019  PRIMARY CARE PHYSICIAN: Patient, [name]   REQUESTING/REFERRING PHYSICIAN: Lurline Hare, MD CHIEF COMPLAINT:   Chief Complaint  Patient presents with  . Respiratory Distress    HISTORY OF PRESENT ILLNESS:  Brett Small  is a 43 y.o. Caucasian male with a known history of multiple medical problems including end-stage renal disease on hemodialysis on Tuesday Thursday and Saturday, hypertension and peripheral arterial disease who presented to the emergency room with acute onset of dyspnea with associated dry cough and wheezing which have significantly worsened since this morning.  The patient denies any fever or chills.  He vomited once.  No bilious vomitus or hematemesis.  No diarrhea or melena or bright red being per rectum.  No headache or dizziness or blurred vision.  He has been having orthopnea and paroxysmal nocturnal dyspnea.  No sick exposure to COVID-19.  Upon presentation to the emergency room blood pressure was significantly elevated at 194/153 with a pulse of 116 respiratory to 26 as the patient was in significant distress nausea 100% FiO2 on BiPAP with pulse oximetry 100%.  Labs revealed hypokalemia of 3.2 and a blood glucose of 305 with BUN of 29 creatinine 4.19 and high-sensitivity troponin I of 22 and later 47 with a BNP more than 4500 and CBC showing leukocytosis of 17.2 with neutrophilia as well as anemia.  COVID-19 test came back negative.  Chest x-ray showed worsening edema or possibly pneumonia with small left pleural effusion.  He received initially nitroglycerin drip that was later stopped as well as sublingual nitroglycerin, 50 mcg of IV fentanyl for milligram of IV Zofran as well as IV vancomycin and Zosyn.  He then received hemodialysis and continued to be on BiPAP during my interview.  He will be  admitted to a stepdown unit for further evaluation and management PAST MEDICAL HISTORY:   Past Medical History:  Diagnosis Date  . ESRD (end stage renal disease) ([place])   . Hypertension   . PAD (peripheral artery disease) ([facility])    Required aortofemoral stent-which had closed and had to redo the procedure and ischemia of limb.  . Peripheral vascular disease ([place])   . Renal disorder   . Secondary hyperparathyroidism of renal origin [facility])     PAST SURGICAL HISTORY:   Past Surgical History:  Procedure Laterality Date  . A/V SHUNT INTERVENTION Left 01/19/2019   Procedure: LEFT UPPER EXTREMITY A/V SHUNTOGRAM / UPPER EXTREMITY ANGIOGRAM;  Surgeon: Algernon Huxley, MD;  Location: [place] [facility];  Service: Cardiovascular;  Laterality: Left;  . A/V SHUNT INTERVENTION Left 03/02/2019   Procedure: A/V SHUNT INTERVENTION;  Surgeon: Algernon Huxley, MD;  Location: [place] [facility];  Service: Cardiovascular;  Laterality: Left;  . AORTA - FEMORAL ARTERY BYPASS GRAFT    . AV FISTULA PLACEMENT Left 12/26/2018   Procedure: INSERTION OF GORE STRETCH VASCULAR 4-7MM X  45CM IN LEFT UPPER ARM;  Surgeon: Marty Heck, MD;  Location: [place];  Service: Vascular;  Laterality: Left;  . DIALYSIS/PERMA CATHETER REMOVAL N/A 02/06/2019   Procedure: DIALYSIS/PERMA CATHETER REMOVAL;  Surgeon: Algernon Huxley, MD;  Location: [place] [facility];  Service: Cardiovascular;  Laterality: N/A;  . LEFT HEART CATH AND CORONARY ANGIOGRAPHY Right 07/10/2019   Procedure: LEFT HEART CATH AND CORONARY ANGIOGRAPHY;  Surgeon: Neoma Laming  A, MD;  Location: [place] [facility];  Service: Cardiovascular;  Laterality: Right;  . PERIPHERAL VASCULAR THROMBECTOMY Left 07/28/2019   Procedure: Left Upper Extremity Dialysis Access Declot;  Surgeon: Katha Cabal, MD;  Location: [place] [facility];  Service: Cardiovascular;  Laterality: Left;    SOCIAL HISTORY:   Social History   Tobacco Use  . Smoking status: Current Every  Day Smoker    Packs/day: 0.50    Years: 30.00    Pack years: 15.00    Types: Cigarettes    Last attempt to quit: 06/26/2018    Years since quitting: 1.1  . Smokeless tobacco: Never Used  . Tobacco comment: smoked for 30 years   Substance Use Topics  . Alcohol use: Not Currently    FAMILY HISTORY:   Family History  Problem Relation Age of Onset  . Hypertension Other   . Diabetes Other   . Clotting disorder Father     DRUG ALLERGIES:   Allergies  Allergen Reactions  . Codeine Nausea Only    Patient questioned this (??)  . Sulfa Antibiotics Hives and Nausea And Vomiting    REVIEW OF SYSTEMS:   ROS As per history of present illness. All pertinent systems were reviewed above. Constitutional,  HEENT, cardiovascular, respiratory, GI, GU, musculoskeletal, neuro, psychiatric, endocrine,  integumentary and hematologic systems were reviewed and are otherwise  negative/unremarkable except for positive findings mentioned above in the HPI.   MEDICATIONS AT HOME:   Prior to Admission medications   Medication Sig Start Date End Date Taking? Authorizing Provider  amLODipine (NORVASC) 10 MG tablet Take 1 tablet (10 mg total) by mouth daily at 8 pm. 02/24/19  Yes Gladstone Lighter, MD  apixaban (ELIQUIS) 2.5 MG TABS tablet Take 1 tablet (2.5 mg total) by mouth 2 (two) times daily. 01/26/19  Yes Clapacs, Madie Reno, MD  aspirin EC 81 MG tablet Take 1 tablet (81 mg total) by mouth daily. 01/26/19  Yes Clapacs, Madie Reno, MD  atorvastatin (LIPITOR) 80 MG tablet Take 1 tablet (80 mg total) by mouth at bedtime. 01/26/19  Yes Clapacs, Madie Reno, MD  b complex-C-folic acid 1 MG capsule Take 1 capsule by mouth daily after supper.    Yes [provider]  carvedilol (COREG) 25 MG tablet Take 1 tablet (25 mg total) by mouth 2 (two) times daily with a meal. 01/26/19  Yes Clapacs, Madie Reno, MD  gabapentin (NEURONTIN) 100 MG capsule Take 1 capsule (100 mg total) by mouth 3 (three) times daily. 01/26/19  Yes  Clapacs, Madie Reno, MD  hydrALAZINE (APRESOLINE) 25 MG tablet Take 1 tablet (25 mg total) by mouth every 8 (eight) hours. 01/26/19  Yes Clapacs, Madie Reno, MD  hydrOXYzine (ATARAX/VISTARIL) 25 MG tablet Take 25 mg by mouth 3 (three) times daily as needed for anxiety.   Yes [provider]  loratadine (CLARITIN) 10 MG tablet Take 1 tablet (10 mg total) by mouth daily. 03/04/19  Yes Salary, Avel Peace, MD  multivitamin (RENA-VIT) TABS tablet Take 1 tablet by mouth daily. 01/27/19  Yes Clapacs, Madie Reno, MD  neomycin-bacitracin-polymyxin (NEOSPORIN) OINT Apply 1 application topically 2 (two) times daily. 01/26/19  Yes Clapacs, Madie Reno, MD  Nutritional Supplements (FEEDING SUPPLEMENT, NEPRO CARB STEADY,) LIQD Take 237 mLs by mouth 2 (two) times daily between meals. 07/06/19  Yes Epifanio Lesches, MD  sevelamer carbonate (RENVELA) 800 MG tablet Take 2 tablets (1,600 mg total) by mouth 3 (three) times daily with meals. 01/26/19  Yes Clapacs,  Madie Reno, MD      VITAL SIGNS:  Blood pressure (!) 144/106, pulse 89, temperature (!) 97.2 F (36.2 C), temperature source Axillary, resp. rate 16, SpO2 100 %.  PHYSICAL EXAMINATION:  Physical Exam  GENERAL: Acutely ill 43 y.o.-year-old Caucasian male patient lying in the bed on BiPAP in mild to moderate respiratory distress with conversational dyspnea. EYES: Pupils equal, round, reactive to light and accommodation. No scleral icterus. Extraocular muscles intact.  HEENT: Head atraumatic, normocephalic. Oropharynx and nasopharynx clear.  NECK:  Supple, no jugular venous distention. No thyroid enlargement, no tenderness.  LUNGS: Diminished bibasilar breath sounds with bibasal crackles. CARDIOVASCULAR: Regular rate and rhythm, S1, S2 normal. No murmurs, rubs, or gallops.  ABDOMEN: Soft, nondistended, nontender. Bowel sounds present. No organomegaly or mass.  EXTREMITIES: No pedal edema, cyanosis, or clubbing.  NEUROLOGIC: Cranial nerves II through XII are intact. Muscle  strength 5/5 in all extremities. Sensation intact. Gait not checked.  PSYCHIATRIC: The patient is alert and oriented x 3.  Normal affect and good eye contact. SKIN: No obvious rash, lesion, or ulcer.   LABORATORY PANEL:   CBC Recent Labs  Lab 08/03/19 0925  WBC 17.2*  HGB 10.4*  HCT 33.6*  PLT 268   ------------------------------------------------------------------------------------------------------------------  Chemistries  Recent Labs  Lab 08/03/19 0925  NA 134*  K 3.2*  CL 100  CO2 21*  GLUCOSE 305*  BUN 29*  CREATININE 4.19*  CALCIUM 8.4*  AST 28  ALT 11  ALKPHOS 73  BILITOT 1.5*   ------------------------------------------------------------------------------------------------------------------  Cardiac Enzymes No results for input(s): TROPONINI in the last 168 hours. ------------------------------------------------------------------------------------------------------------------  RADIOLOGY:  Dg Chest Portable 1 View  Result Date: 08/03/2019 CLINICAL DATA:  Respiratory distress. EXAM: PORTABLE CHEST 1 VIEW COMPARISON:  October 5th 2020. FINDINGS: Stable cardiomegaly. No pneumothorax is noted. Increased bilateral perihilar and basilar opacities are noted concerning for worsening edema or possibly pneumonia. Small left pleural effusion is noted. Stable right internal jugular dialysis catheter is noted. Bony thorax is unremarkable. IMPRESSION: Increased bilateral lung opacities are noted concerning for worsening edema or possibly pneumonia. Small left pleural effusion is noted. Electronically Signed   By: Marijo Conception M.D.   On: 08/03/2019 09:43   US Abdomen Limited Ruq  Result Date: 08/03/2019 CLINICAL DATA:  Right upper quadrant pain.  End-stage renal disease. EXAM: ULTRASOUND ABDOMEN LIMITED RIGHT UPPER QUADRANT COMPARISON:  07/20/2019 FINDINGS: Gallbladder: No gallstones or wall thickening visualized. No sonographic Murphy sign noted by sonographer. Common  bile duct: Diameter: 4 mm Liver: No focal lesion identified. Within normal limits in parenchymal echogenicity. Portal vein is patent on color Doppler imaging with normal direction of blood flow towards the liver. Other: Bilateral pleural effusions and small to moderate volume ascites within the visualized abdomen. IMPRESSION: 1. Unremarkable sonographic appearance of the liver and gallbladder. 2. Small to moderate volume ascites. 3. Bilateral pleural effusions. Electronically Signed   By: Davina Poke M.D.   On: 08/03/2019 12:51      IMPRESSION AND PLAN:   1.  Acute pulmonary edema possibly associated with new onset acute CHF likely diastolic and likely contributed to by fluid overload with end-stage renal disease on hemodialysis with subsequent acute respiratory failure requiring BiPAP.   -The patient will be admitted to a stepdown bed and will be diuresed with IV Lasix.  - Will follow serial cardiac enzymes.   -Will obtain a 2D echo(if not done last 6 months) and a cardiology consultation in a.m. -Dr. Neoma Laming will be consulted  in a.m. to see the patient.  2.  Possible atypical pneumonia.  The patient will be placed on IV Rocephin and Zithromax awaiting blood cultures and sputum cultures result.  Mucolytic's will be provided.  COVID-19 test came back negative.  Atypical bacterial pneumonia antigens and antibodies were sent.  3.  Hypertensive urgency.  Blood pressure significant improved after hemodialysis.  The patient will be continued on his antihypertensives.  The patient will be placed on as needed IV labetalol.  4.  End-stage renal disease on hemodialysis.  Nephrology follow-up consultation will be obtained by Dr. Zollie Scale.  He was notified about the patient.  We will continue Renvela.  5. paroxysmal atrial fibrillation.  Eliquis will be resumed.  6.  Dyslipidemia.  Statin therapy will be resumed.  7.  DVT prophylaxis.  Subcutaneous heparin.  All the records are reviewed and case  discussed with ED provider. The plan of care was discussed in details with the patient (and family). I answered all questions. The patient agreed to proceed with the above mentioned plan. Further management will depend upon hospital course.   CODE STATUS: Full code  TOTAL CRITICAL CARE TIME TAKING CARE OF THIS PATIENT: 55 minutes.    Christel Mormon M.D on 08/04/2019 at 1:27 AM  Pager - [phone]  After 6pm go to [email or web address] - Proofreader  [facility]  Office  [phone]  CC: Primary care physician; Patient, [name]   Note: This dictation was prepared with Dragon dictation along with smaller phrase technology. Any transcriptional errors that result from this process are unintentional.

## 2019-08-04 NOTE — ED Notes (Signed)
Pt transported to dialysis by Kirke Shaggy, RN. Report given to [place], Dialysis RN, and Katharine Look, ICU RN. Katharine Look, RN aware patient to come to ICU after dialysis.

## 2019-08-04 NOTE — Progress Notes (Signed)
HD Tx Completed:   08/04/19 0121  Vital Signs  Temp 98 F (36.7 C)  Temp Source Oral  Pulse Rate 93  Pulse Rate Source Monitor  Resp 16  BP 132/83  BP Location Right Arm  BP Method Automatic  Patient Position (if appropriate) Lying  Oxygen Therapy  SpO2 100 %  O2 Device Bi-PAP  Pain Assessment  Pain Scale 0-10  Pain Score 0  During Hemodialysis Assessment  KECN 67.6 KECN  Dialysis Fluid Bolus Normal Saline  Bolus Amount (mL) 250 mL  Intra-Hemodialysis Comments Tx completed;Tolerated well

## 2019-08-04 NOTE — ED Notes (Signed)
This RN spoke with Dr. Brett Albino regarding patient going to dialysis and [facility] for ICU. Per Dr. Brett Albino, due to patient being stable at this time pt is okay to go down to dialysis then be transported to ICU for telemetry admission after dialysis. This RN coordinated with Erasmo Downer, Dialysis RN and Debe Coder RN regarding patient care. Erasmo Downer, Dialysis RN, Sarah, ICU Charge RN, Nira Conn, ED Charge RN, and Dr. Brett Albino Admitting MD aware of plan at this time. Will continue to monitor for further patient needs.

## 2019-08-05 ENCOUNTER — Inpatient Hospital Stay: Payer: Medicare Other

## 2019-08-05 ENCOUNTER — Other Ambulatory Visit: Payer: Medicare Other

## 2019-08-05 DIAGNOSIS — E440 Moderate protein-calorie malnutrition: Secondary | ICD-10-CM | POA: Insufficient documentation

## 2019-08-05 DIAGNOSIS — I469 Cardiac arrest, cause unspecified: Secondary | ICD-10-CM

## 2019-08-05 LAB — CBC WITH DIFFERENTIAL/PLATELET
Abs Immature Granulocytes: 0.15 10*3/uL — ABNORMAL HIGH (ref 0.00–0.07)
Abs Immature Granulocytes: 0.06 10*3/uL (ref 0.00–0.07)
Abs Immature Granulocytes: 0.04 10*3/uL (ref 0.00–0.07)
Basophils Absolute: 0.1 10*3/uL (ref 0.0–0.1)
Basophils Absolute: 0.1 10*3/uL (ref 0.0–0.1)
Basophils Absolute: 0 10*3/uL (ref 0.0–0.1)
Basophils Relative: 1 %
Basophils Relative: 1 %
Basophils Relative: 0 %
Eosinophils Absolute: 0.4 10*3/uL (ref 0.0–0.5)
Eosinophils Absolute: 0.2 10*3/uL (ref 0.0–0.5)
Eosinophils Absolute: 0.1 10*3/uL (ref 0.0–0.5)
Eosinophils Relative: 3 %
Eosinophils Relative: 2 %
Eosinophils Relative: 1 %
HCT: 26.8 % — ABNORMAL LOW (ref 39.0–52.0)
HCT: 26.3 % — ABNORMAL LOW (ref 39.0–52.0)
HCT: 26.3 % — ABNORMAL LOW (ref 39.0–52.0)
Hemoglobin: 8.2 g/dL — ABNORMAL LOW (ref 13.0–17.0)
Hemoglobin: 8.2 g/dL — ABNORMAL LOW (ref 13.0–17.0)
Hemoglobin: 8.3 g/dL — ABNORMAL LOW (ref 13.0–17.0)
Immature Granulocytes: 1 %
Immature Granulocytes: 1 %
Immature Granulocytes: 1 %
Lymphocytes Relative: 6 %
Lymphocytes Relative: 11 %
Lymphocytes Relative: 8 %
Lymphs Abs: 0.7 10*3/uL (ref 0.7–4.0)
Lymphs Abs: 1.3 10*3/uL (ref 0.7–4.0)
Lymphs Abs: 0.6 10*3/uL — ABNORMAL LOW (ref 0.7–4.0)
MCH: 31.9 pg (ref 26.0–34.0)
MCH: 32 pg (ref 26.0–34.0)
MCH: 32.3 pg (ref 26.0–34.0)
MCHC: 30.6 g/dL (ref 30.0–36.0)
MCHC: 31.2 g/dL (ref 30.0–36.0)
MCHC: 31.6 g/dL (ref 30.0–36.0)
MCV: 104.3 fL — ABNORMAL HIGH (ref 80.0–100.0)
MCV: 102.7 fL — ABNORMAL HIGH (ref 80.0–100.0)
MCV: 102.3 fL — ABNORMAL HIGH (ref 80.0–100.0)
Monocytes Absolute: 0.4 10*3/uL (ref 0.1–1.0)
Monocytes Absolute: 0.6 10*3/uL (ref 0.1–1.0)
Monocytes Absolute: 0.2 10*3/uL (ref 0.1–1.0)
Monocytes Relative: 3 %
Monocytes Relative: 5 %
Monocytes Relative: 2 %
Neutro Abs: 10.7 10*3/uL — ABNORMAL HIGH (ref 1.7–7.7)
Neutro Abs: 9.9 10*3/uL — ABNORMAL HIGH (ref 1.7–7.7)
Neutro Abs: 7.1 10*3/uL (ref 1.7–7.7)
Neutrophils Relative %: 86 %
Neutrophils Relative %: 80 %
Neutrophils Relative %: 88 %
Platelets: 135 10*3/uL — ABNORMAL LOW (ref 150–400)
Platelets: 134 10*3/uL — ABNORMAL LOW (ref 150–400)
Platelets: 122 10*3/uL — ABNORMAL LOW (ref 150–400)
RBC: 2.57 MIL/uL — ABNORMAL LOW (ref 4.22–5.81)
RBC: 2.56 MIL/uL — ABNORMAL LOW (ref 4.22–5.81)
RBC: 2.57 MIL/uL — ABNORMAL LOW (ref 4.22–5.81)
RDW: 17.2 % — ABNORMAL HIGH (ref 11.5–15.5)
RDW: 17.2 % — ABNORMAL HIGH (ref 11.5–15.5)
RDW: 17.1 % — ABNORMAL HIGH (ref 11.5–15.5)
WBC: 12.3 10*3/uL — ABNORMAL HIGH (ref 4.0–10.5)
WBC: 12.1 10*3/uL — ABNORMAL HIGH (ref 4.0–10.5)
WBC: 8 10*3/uL (ref 4.0–10.5)
nRBC: 0 % (ref 0.0–0.2)
nRBC: 0 % (ref 0.0–0.2)
nRBC: 0 % (ref 0.0–0.2)

## 2019-08-05 LAB — COMPREHENSIVE METABOLIC PANEL
ALT: 67 U/L — ABNORMAL HIGH (ref 0–44)
AST: 117 U/L — ABNORMAL HIGH (ref 15–41)
Albumin: 2.6 g/dL — ABNORMAL LOW (ref 3.5–5.0)
Alkaline Phosphatase: 94 U/L (ref 38–126)
Anion gap: 9 (ref 5–15)
BUN: 21 mg/dL — ABNORMAL HIGH (ref 6–20)
CO2: 28 mmol/L (ref 22–32)
Calcium: 9.5 mg/dL (ref 8.9–10.3)
Chloride: 103 mmol/L (ref 98–111)
Creatinine, Ser: 2.63 mg/dL — ABNORMAL HIGH (ref 0.61–1.24)
GFR calc Af Amer: 33 mL/min — ABNORMAL LOW (ref >60)
GFR calc non Af Amer: 28 mL/min — ABNORMAL LOW (ref >60)
Glucose, Bld: 164 mg/dL — ABNORMAL HIGH (ref 70–99)
Potassium: 5 mmol/L (ref 3.5–5.1)
Sodium: 140 mmol/L (ref 135–145)
Total Bilirubin: 1.2 mg/dL (ref 0.3–1.2)
Total Protein: 6.2 g/dL — ABNORMAL LOW (ref 6.5–8.1)

## 2019-08-05 LAB — PROTIME-INR
INR: 1.4 — ABNORMAL HIGH (ref 0.8–1.2)
INR: 1.5 — ABNORMAL HIGH (ref 0.8–1.2)
Prothrombin Time: 17.3 seconds — ABNORMAL HIGH (ref 11.4–15.2)
Prothrombin Time: 18.2 seconds — ABNORMAL HIGH (ref 11.4–15.2)

## 2019-08-05 LAB — GLUCOSE, CAPILLARY
Glucose-Capillary: 125 mg/dL — ABNORMAL HIGH (ref 70–99)
Glucose-Capillary: 126 mg/dL — ABNORMAL HIGH (ref 70–99)
Glucose-Capillary: 68 mg/dL — ABNORMAL LOW (ref 70–99)
Glucose-Capillary: 70 mg/dL (ref 70–99)
Glucose-Capillary: 80 mg/dL (ref 70–99)
Glucose-Capillary: 92 mg/dL (ref 70–99)
Glucose-Capillary: 96 mg/dL (ref 70–99)
Glucose-Capillary: 97 mg/dL (ref 70–99)

## 2019-08-05 LAB — URINALYSIS, COMPLETE (UACMP) WITH MICROSCOPIC
Bacteria, UA: NONE SEEN
Bilirubin Urine: NEGATIVE
Glucose, UA: NEGATIVE mg/dL
Ketones, ur: NEGATIVE mg/dL
Nitrite: NEGATIVE
Protein, ur: 100 mg/dL — AB
Specific Gravity, Urine: 1.023 (ref 1.005–1.030)
pH: 6 (ref 5.0–8.0)

## 2019-08-05 LAB — BASIC METABOLIC PANEL
Anion gap: 14 (ref 5–15)
Anion gap: 15 (ref 5–15)
BUN: 23 mg/dL — ABNORMAL HIGH (ref 6–20)
BUN: 30 mg/dL — ABNORMAL HIGH (ref 6–20)
CO2: 23 mmol/L (ref 22–32)
CO2: 22 mmol/L (ref 22–32)
Calcium: 9.1 mg/dL (ref 8.9–10.3)
Calcium: 9.1 mg/dL (ref 8.9–10.3)
Chloride: 100 mmol/L (ref 98–111)
Chloride: 100 mmol/L (ref 98–111)
Creatinine, Ser: 2.87 mg/dL — ABNORMAL HIGH (ref 0.61–1.24)
Creatinine, Ser: 3.44 mg/dL — ABNORMAL HIGH (ref 0.61–1.24)
GFR calc Af Amer: 29 mL/min — ABNORMAL LOW (ref >60)
GFR calc Af Amer: 24 mL/min — ABNORMAL LOW (ref >60)
GFR calc non Af Amer: 25 mL/min — ABNORMAL LOW (ref >60)
GFR calc non Af Amer: 20 mL/min — ABNORMAL LOW (ref >60)
Glucose, Bld: 129 mg/dL — ABNORMAL HIGH (ref 70–99)
Glucose, Bld: 98 mg/dL (ref 70–99)
Potassium: 5.3 mmol/L — ABNORMAL HIGH (ref 3.5–5.1)
Potassium: 4.7 mmol/L (ref 3.5–5.1)
Sodium: 137 mmol/L (ref 135–145)
Sodium: 137 mmol/L (ref 135–145)

## 2019-08-05 LAB — BLOOD GAS, ARTERIAL
Acid-Base Excess: 0.8 mmol/L (ref 0.0–2.0)
Bicarbonate: 26.6 mmol/L (ref 20.0–28.0)
FIO2: 60
MECHVT: 450 mL
Mechanical Rate: 15
O2 Saturation: 95.2 %
PEEP: 5 cmH2O
Patient temperature: 37
pCO2 arterial: 46 mmHg (ref 32.0–48.0)
pH, Arterial: 7.37 (ref 7.350–7.450)
pO2, Arterial: 79 mmHg — ABNORMAL LOW (ref 83.0–108.0)

## 2019-08-05 LAB — URINE DRUG SCREEN, QUALITATIVE (ARMC ONLY)
Amphetamines, Ur Screen: NOT DETECTED
Barbiturates, Ur Screen: NOT DETECTED
Benzodiazepine, Ur Scrn: POSITIVE — AB
Cannabinoid 50 Ng, Ur ~~LOC~~: POSITIVE — AB
Cocaine Metabolite,Ur ~~LOC~~: NOT DETECTED
MDMA (Ecstasy)Ur Screen: NOT DETECTED
Methadone Scn, Ur: NOT DETECTED
Opiate, Ur Screen: NOT DETECTED
Phencyclidine (PCP) Ur S: NOT DETECTED
Tricyclic, Ur Screen: NOT DETECTED

## 2019-08-05 LAB — LACTIC ACID, PLASMA
Lactic Acid, Venous: 3.2 mmol/L (ref 0.5–1.9)
Lactic Acid, Venous: 2.2 mmol/L (ref 0.5–1.9)

## 2019-08-05 LAB — BRAIN NATRIURETIC PEPTIDE: B Natriuretic Peptide: 3772 pg/mL — ABNORMAL HIGH (ref 0.0–100.0)

## 2019-08-05 LAB — TROPONIN I (HIGH SENSITIVITY)
Troponin I (High Sensitivity): 31 ng/L — ABNORMAL HIGH (ref <18)
Troponin I (High Sensitivity): 72 ng/L — ABNORMAL HIGH (ref <18)

## 2019-08-05 LAB — APTT
aPTT: 59 seconds — ABNORMAL HIGH (ref 24–36)
aPTT: 69 seconds — ABNORMAL HIGH (ref 24–36)

## 2019-08-05 LAB — LEGIONELLA PNEUMOPHILA SEROGP 1 UR AG: L. pneumophila Serogp 1 Ur Ag: NEGATIVE

## 2019-08-05 LAB — TRIGLYCERIDES: Triglycerides: 62 mg/dL (ref <150)

## 2019-08-05 LAB — FIBRIN DERIVATIVES D-DIMER (ARMC ONLY): Fibrin derivatives D-dimer (ARMC): >7500 ng/mL (FEU) — ABNORMAL HIGH (ref 0.00–499.00)

## 2019-08-05 LAB — PROCALCITONIN: Procalcitonin: 4.24 ng/mL

## 2019-08-05 LAB — MYCOPLASMA PNEUMONIAE ANTIBODY, IGM: Mycoplasma pneumo IgM: <770 U/mL (ref 0–769)

## 2019-08-05 MED ORDER — VANCOMYCIN HCL IN DEXTROSE 1-5 GM/200ML-% IV SOLN
1000.0000 mg | Freq: Once | INTRAVENOUS | Status: AC
  Administered 2019-08-05: 1000 mg via INTRAVENOUS
  Filled 2019-08-05: qty 200

## 2019-08-05 MED ORDER — INSULIN ASPART 100 UNIT/ML ~~LOC~~ SOLN
0.0000 [IU] | SUBCUTANEOUS | Status: DC
  Administered 2019-08-05 – 2019-08-10 (×8): 1 [IU] via SUBCUTANEOUS
  Filled 2019-08-05 (×9): qty 1

## 2019-08-05 MED ORDER — SODIUM CHLORIDE 0.9 % IV SOLN: INTRAVENOUS | Status: DC

## 2019-08-05 MED ORDER — FENTANYL 2500MCG IN NS 250ML (10MCG/ML) PREMIX INFUSION
INTRAVENOUS | Status: AC
  Administered 2019-08-05: 100 ug/h via INTRAVENOUS
  Filled 2019-08-05: qty 250

## 2019-08-05 MED ORDER — CHLORHEXIDINE GLUCONATE 0.12% ORAL RINSE (MEDLINE KIT)
15.0000 mL | Freq: Two times a day (BID) | OROMUCOSAL | Status: DC
  Administered 2019-08-05 – 2019-08-08 (×8): 15 mL via OROMUCOSAL

## 2019-08-05 MED ORDER — NOREPINEPHRINE 4 MG/250ML-% IV SOLN: 0.0000 ug/min | INTRAVENOUS | Status: DC

## 2019-08-05 MED ORDER — FENTANYL 2500MCG IN NS 250ML (10MCG/ML) PREMIX INFUSION
0.0000 ug/h | INTRAVENOUS | Status: DC
  Administered 2019-08-05 (×2): 100 ug/h via INTRAVENOUS
  Administered 2019-08-06: 200 ug/h via INTRAVENOUS
  Filled 2019-08-05 (×2): qty 250

## 2019-08-05 MED ORDER — PANTOPRAZOLE SODIUM 40 MG IV SOLR
40.0000 mg | Freq: Every day | INTRAVENOUS | Status: DC
  Administered 2019-08-05 – 2019-08-12 (×8): 40 mg via INTRAVENOUS
  Filled 2019-08-05 (×8): qty 40

## 2019-08-05 MED ORDER — ETOMIDATE 2 MG/ML IV SOLN
INTRAVENOUS | Status: AC
  Filled 2019-08-05: qty 10

## 2019-08-05 MED ORDER — LEVETIRACETAM IN NACL 500 MG/100ML IV SOLN
500.0000 mg | Freq: Two times a day (BID) | INTRAVENOUS | Status: DC
  Administered 2019-08-05 – 2019-08-15 (×20): 500 mg via INTRAVENOUS
  Filled 2019-08-05 (×23): qty 100

## 2019-08-05 MED ORDER — HYDROCORTISONE NA SUCCINATE PF 100 MG IJ SOLR
100.0000 mg | Freq: Two times a day (BID) | INTRAMUSCULAR | Status: DC
  Administered 2019-08-05 – 2019-08-15 (×17): 100 mg via INTRAVENOUS
  Filled 2019-08-05 (×22): qty 2

## 2019-08-05 MED ORDER — MIDAZOLAM HCL 2 MG/2ML IJ SOLN
2.0000 mg | Freq: Once | INTRAMUSCULAR | Status: AC
  Administered 2019-08-05: 04:00:00 2 mg via INTRAVENOUS

## 2019-08-05 MED ORDER — DOPAMINE-DEXTROSE 3.2-5 MG/ML-% IV SOLN
INTRAVENOUS | Status: AC
  Filled 2019-08-05: qty 250

## 2019-08-05 MED ORDER — FAMOTIDINE IN NACL 20-0.9 MG/50ML-% IV SOLN
20.0000 mg | INTRAVENOUS | Status: DC
  Administered 2019-08-05 – 2019-08-09 (×5): 20 mg via INTRAVENOUS
  Filled 2019-08-05 (×5): qty 50

## 2019-08-05 MED ORDER — SODIUM CHLORIDE 0.9 % IV SOLN
75.0000 mg | Freq: Two times a day (BID) | INTRAVENOUS | Status: DC
  Filled 2019-08-05 (×2): qty 7.5

## 2019-08-05 MED ORDER — ASPIRIN 81 MG PO CHEW
81.0000 mg | CHEWABLE_TABLET | Freq: Every day | ORAL | Status: DC
  Administered 2019-08-07 – 2019-08-17 (×8): 81 mg
  Filled 2019-08-05 (×9): qty 1

## 2019-08-05 MED ORDER — IPRATROPIUM-ALBUTEROL 0.5-2.5 (3) MG/3ML IN SOLN
3.0000 mL | Freq: Four times a day (QID) | RESPIRATORY_TRACT | Status: DC
  Administered 2019-08-05 – 2019-08-07 (×8): 3 mL via RESPIRATORY_TRACT
  Filled 2019-08-05 (×7): qty 3

## 2019-08-05 MED ORDER — NOREPINEPHRINE 16 MG/250ML-% IV SOLN
0.0000 ug/min | INTRAVENOUS | Status: DC
  Administered 2019-08-05: 10 ug/min via INTRAVENOUS
  Filled 2019-08-05 (×2): qty 250

## 2019-08-05 MED ORDER — ATROPINE SULFATE 1 MG/10ML IJ SOSY
PREFILLED_SYRINGE | INTRAMUSCULAR | Status: AC
  Administered 2019-08-05: 1 mg via INTRAVENOUS
  Filled 2019-08-05: qty 10

## 2019-08-05 MED ORDER — SODIUM CHLORIDE 0.9 % IV SOLN
2.0000 g | INTRAVENOUS | Status: DC
  Administered 2019-08-06 – 2019-08-08 (×2): 2 g via INTRAVENOUS
  Filled 2019-08-05 (×2): qty 2

## 2019-08-05 MED ORDER — HEPARIN (PORCINE) 25000 UT/250ML-% IV SOLN
850.0000 [IU]/h | INTRAVENOUS | Status: DC
  Administered 2019-08-05: 700 [IU]/h via INTRAVENOUS
  Administered 2019-08-06: 750 [IU]/h via INTRAVENOUS
  Administered 2019-08-08 – 2019-08-10 (×3): 800 [IU]/h via INTRAVENOUS
  Filled 2019-08-05 (×5): qty 250

## 2019-08-05 MED ORDER — FENTANYL CITRATE (PF) 100 MCG/2ML IJ SOLN
100.0000 ug | Freq: Once | INTRAMUSCULAR | Status: AC
  Administered 2019-08-05: 04:00:00 100 ug via INTRAVENOUS

## 2019-08-05 MED ORDER — FENTANYL CITRATE (PF) 100 MCG/2ML IJ SOLN
INTRAMUSCULAR | Status: AC
  Administered 2019-08-05: 100 ug via INTRAVENOUS
  Filled 2019-08-05: qty 2

## 2019-08-05 MED ORDER — PROPOFOL 1000 MG/100ML IV EMUL
5.0000 ug/kg/min | INTRAVENOUS | Status: DC
  Administered 2019-08-05: 15 ug/kg/min via INTRAVENOUS
  Administered 2019-08-05: 30 ug/kg/min via INTRAVENOUS
  Administered 2019-08-06: 40 ug/kg/min via INTRAVENOUS
  Administered 2019-08-06: 45 ug/kg/min via INTRAVENOUS
  Administered 2019-08-06: 50 ug/kg/min via INTRAVENOUS
  Filled 2019-08-05 (×5): qty 100

## 2019-08-05 MED ORDER — ORAL CARE MOUTH RINSE
15.0000 mL | OROMUCOSAL | Status: DC
  Administered 2019-08-05 – 2019-08-08 (×29): 15 mL via OROMUCOSAL

## 2019-08-05 MED ORDER — ALBUTEROL SULFATE (2.5 MG/3ML) 0.083% IN NEBU: 2.5000 mg | INHALATION_SOLUTION | RESPIRATORY_TRACT | Status: DC | PRN

## 2019-08-05 MED ORDER — HEPARIN BOLUS VIA INFUSION
1000.0000 [IU] | Freq: Once | INTRAVENOUS | Status: AC
  Administered 2019-08-05: 1000 [IU] via INTRAVENOUS
  Filled 2019-08-05: qty 1000

## 2019-08-05 MED ORDER — SODIUM CHLORIDE 0.9 % IV SOLN
2.0000 g | Freq: Once | INTRAVENOUS | Status: AC
  Administered 2019-08-05: 2 g via INTRAVENOUS
  Filled 2019-08-05: qty 2

## 2019-08-05 MED ORDER — ATROPINE SULFATE 1 MG/10ML IJ SOSY
1.0000 mg | PREFILLED_SYRINGE | Freq: Once | INTRAMUSCULAR | Status: AC
  Administered 2019-08-05: 04:00:00 1 mg via INTRAVENOUS

## 2019-08-05 MED ORDER — ROCURONIUM BROMIDE 50 MG/5ML IV SOLN
INTRAVENOUS | Status: AC
  Filled 2019-08-05: qty 1

## 2019-08-05 MED ORDER — MIDAZOLAM HCL 2 MG/2ML IJ SOLN
INTRAMUSCULAR | Status: AC
  Administered 2019-08-05: 2 mg via INTRAVENOUS
  Filled 2019-08-05: qty 2

## 2019-08-05 MED ORDER — NOREPINEPHRINE 4 MG/250ML-% IV SOLN
INTRAVENOUS | Status: AC
  Filled 2019-08-05: qty 250

## 2019-08-05 NOTE — Consult Note (Signed)
PULMONARY / CRITICAL CARE MEDICINE   Name: Brett Small MRN: [id number] DOB: [birth year]    ADMISSION DATE:  08/03/2019 CONSULTATION DATE:  08/05/2019  REFERRING MD: Dr. Sidney Ace  CHIEF COMPLAINT: Cardiac arrest  BRIEF DISCUSSION: 43 year old male admitted initially on 10/13 as a telemetry patient for acute hypoxic respiratory failure in the setting of volume overload/pulmonary edema and questionable atypical pneumonia requiring BiPAP.  He was weaned off BiPAP.  On 10/14 he suddenly became altered, then went unresponsive with agonal respirations. He then suffered respiratory arrest with subsequent bradycardia and PEA arrest requiring approximately 10 minutes of ACLS with ROSC.  Postarrest he is noted to have dilated pupils and decorticate posturing upon painful stimuli, along with rhythmic motion of the tongue/lips concerning questionable seizure activity vs. Myoclonus.  CT Head is negative, will initiate targeted temperature management therapy at 36 C.   D-Dimer is greater than 7500, concerning for questionable PE as cause of respiratory arrest, will place on Heparin infusion.  HISTORY OF PRESENT ILLNESS:   [name] is a 43 year old male with a past medical history notable for end-stage renal disease on hemodialysis, hypertension, PAD, PVD who presented to [facility] ED on 08/03/2019 with complaints of acute onset of shortness of breath with associated dry cough and wheezing.  He denied fever,chills, or sick contacts.  Upon presentation to the ED he was noted to have significantly elevated blood pressure at 194/153.  Given his respiratory distress he required BiPAP.  Initial work-up in the ED revealed potassium 3.2, glucose 305, creatinine 4.19, and high-sensitivity troponin 22, WBC 17.2 with neutrophilia, and BNP greater than 4500.  His COVID-19 PCR was negative.  Chest x-ray was concerning for pulmonary edema and questionable pneumonia with small left pleural effusion.  He was admitted by hospitalist to  telemetry unit for further work-up and treatment of acute hypoxic respiratory failure secondary to pulmonary edema/volume overload and questionable atypical pneumonia.  He initially required BiPAP, but was soon weaned off.  Nephrology was consulted for hemodialysis.  On 08/05/2019 he suddenly became altered and then went unresponsive with agonal respirations leading to eventual respiratory arrest with subsequent bradycardia and PEA arrest.  He received multiple rounds of epinephrine, atropine, calcium gluconate, and bicarb.  ROSC was obtained after approximately 10 minutes of ACLS.  Postarrest he is unresponsive with decorticate posturing to pain.  During the cardiac arrest he was intubated.  Stat CT head is negative.  Given his unresponsiveness post cardiac arrest will place on targeted temperature management at 36 C. His D-dimer is greater than 7500, concenring for possible PE as the cause of his respiratory arrest.  He currently has not been stable enough for imaging of his chest to confirm PE, will therefore place on Heparin drip.  PCCM is consulted for further management.  PAST MEDICAL HISTORY :  He  has a past medical history of ESRD (end stage renal disease) ([place]), Hypertension, PAD (peripheral artery disease) ([place]), Peripheral vascular disease ([place]), Renal disorder, and Secondary hyperparathyroidism of renal origin ([place]).  PAST SURGICAL HISTORY: He  has a past surgical history that includes Aorta - femoral artery bypass graft; AV fistula placement (Left, 12/26/2018); A/V SHUNT INTERVENTION (Left, 01/19/2019); DIALYSIS/PERMA CATHETER REMOVAL (N/A, 02/06/2019); A/V SHUNT INTERVENTION (Left, 03/02/2019); LEFT HEART CATH AND CORONARY ANGIOGRAPHY (Right, 07/10/2019); and PERIPHERAL VASCULAR THROMBECTOMY (Left, 07/28/2019).  Allergies  Allergen Reactions  . Codeine Nausea Only    Patient questioned this (??)  . Sulfa Antibiotics Hives and Nausea And Vomiting    No current facility-administered  medications on file prior to encounter.    Current Outpatient Medications on File Prior to Encounter  Medication Sig  . amLODipine (NORVASC) 10 MG tablet Take 1 tablet (10 mg total) by mouth daily at 8 pm.  . apixaban (ELIQUIS) 2.5 MG TABS tablet Take 1 tablet (2.5 mg total) by mouth 2 (two) times daily.  Marland Kitchen aspirin EC 81 MG tablet Take 1 tablet (81 mg total) by mouth daily.  Marland Kitchen atorvastatin (LIPITOR) 80 MG tablet Take 1 tablet (80 mg total) by mouth at bedtime.  Marland Kitchen b complex-C-folic acid 1 MG capsule Take 1 capsule by mouth daily after supper.   . carvedilol (COREG) 25 MG tablet Take 1 tablet (25 mg total) by mouth 2 (two) times daily with a meal.  . gabapentin (NEURONTIN) 100 MG capsule Take 1 capsule (100 mg total) by mouth 3 (three) times daily.  . hydrALAZINE (APRESOLINE) 25 MG tablet Take 1 tablet (25 mg total) by mouth every 8 (eight) hours.  . hydrOXYzine (ATARAX/VISTARIL) 25 MG tablet Take 25 mg by mouth 3 (three) times daily as needed for anxiety.  Marland Kitchen loratadine (CLARITIN) 10 MG tablet Take 1 tablet (10 mg total) by mouth daily.  . multivitamin (RENA-VIT) TABS tablet Take 1 tablet by mouth daily.  Marland Kitchen neomycin-bacitracin-polymyxin (NEOSPORIN) OINT Apply 1 application topically 2 (two) times daily.  . Nutritional Supplements (FEEDING SUPPLEMENT, NEPRO CARB STEADY,) LIQD Take 237 mLs by mouth 2 (two) times daily between meals.  . sevelamer carbonate (RENVELA) 800 MG tablet Take 2 tablets (1,600 mg total) by mouth 3 (three) times daily with meals.    FAMILY HISTORY:  His He indicated that his mother is alive. He indicated that his father is alive. He indicated that the status of his other is unknown.   SOCIAL HISTORY: He  reports that he has been smoking cigarettes. He has a 15.00 pack-year smoking history. He has never used smokeless tobacco. He reports previous alcohol use. He reports current drug use. Frequency: 1.00 time per week. Drug: Marijuana.    COVID-19 DISASTER  DECLARATION:  FULL CONTACT PHYSICAL EXAMINATION WAS NOT POSSIBLE DUE TO TREATMENT OF COVID-19 AND  CONSERVATION OF PERSONAL PROTECTIVE EQUIPMENT, LIMITED EXAM FINDINGS INCLUDE-  Patient assessed or the symptoms described in the history of present illness.  In the context of the Global COVID-19 pandemic, which necessitated consideration that the patient might be at risk for infection with the SARS-CoV-2 virus that causes COVID-19, Institutional protocols and algorithms that pertain to the evaluation of patients at risk for COVID-19 are in a state of rapid change based on information released by regulatory bodies including the CDC and federal and state organizations. These policies and algorithms were followed during the patient's care while in hospital.   REVIEW OF SYSTEMS:   Unable to obtain due to intubation and critical illness  SUBJECTIVE:  Unable to obtain due to intubation and critical illness  VITAL SIGNS: BP 111/90   Pulse (!) 105   Temp 98.5 F (36.9 C) (Oral)   Resp 17   Ht 5' 1" (1.549 m)   Wt 43.4 kg   SpO2 99%   BMI 18.08 kg/m   HEMODYNAMICS:    VENTILATOR SETTINGS:    INTAKE / OUTPUT: I/O last 3 completed shifts: In: 357 [I.V.:10.4; IV Piggyback:346.6] Out: 4200 [Urine:200; Other:4000]  PHYSICAL EXAMINATION: General: Acutely ill-appearing male, laying in bed, intubated and sedated, no acute distress Neuro: Sedated, decorticate posturing to pain, pupils dilated at 5 mm bilaterally and sluggish, cough or  gag reflex present HEENT: Atraumatic, normocephalic, neck supple, no JVD Cardiovascular: Tachycardia, regular rhythm, S1-S2, no murmurs rubs or gallops Lungs: Coarse breath sounds bilaterally, even, vent assisted, nonlabored Abdomen: Soft, nontender, nondistended, no guarding or rebound tenderness Musculoskeletal: No deformities, no edema Skin: Warm and dry, no obvious rashes, lesions, or ulcerations  LABS:  BMET Recent Labs  Lab 08/03/19 0925  08/04/19 0151 08/04/19 0845  NA 134*  --  139  K 3.2*  --  3.4*  CL 100  --  99  CO2 21*  --  26  BUN 29*  --  18  CREATININE 4.19* 1.97* 2.86*  GLUCOSE 305*  --  82    Electrolytes Recent Labs  Lab 08/03/19 0925 08/04/19 0845  CALCIUM 8.4* 8.3*  PHOS  --  3.9    CBC Recent Labs  Lab 08/03/19 0925 08/04/19 0151 08/04/19 0349  WBC 17.2* 10.2 10.1  HGB 10.4* 9.2* 9.2*  HCT 33.6* 29.2* 28.8*  PLT 268 146* 150    Coag's No results for input(s): APTT, INR in the last 168 hours.  Sepsis Markers Recent Labs  Lab 08/03/19 0925 08/04/19 1412 08/04/19 1653  LATICACIDVEN 4.1* 2.6* 1.9    ABG No results for input(s): PHART, PCO2ART, PO2ART in the last 168 hours.  Liver Enzymes Recent Labs  Lab 08/03/19 0925 08/04/19 0845  AST 28  --   ALT 11  --   ALKPHOS 73  --   BILITOT 1.5*  --   ALBUMIN 2.9* 2.7*    Cardiac Enzymes No results for input(s): TROPONINI, PROBNP in the last 168 hours.  Glucose Recent Labs  Lab 08/04/19 1246 08/04/19 1319 08/05/19 0308  GLUCAP 57* 101* 125*    Imaging Dg Chest Port 1 View  Result Date: 08/05/2019 CLINICAL DATA:  Intubation EXAM: PORTABLE CHEST 1 VIEW COMPARISON:  08/03/2019, 07/27/2019, 07/21/2019 FINDINGS: Interval intubation, tip of the endotracheal tube is about 3.1 cm superior to the carina. Right-sided central venous catheter tips over the SVC and proximal right atrium. Cardiomegaly with vascular congestion and diffuse interstitial and ground-glass opacity consistent with edema. Small left effusion likely layering. Dense left lung base atelectasis or pneumonia. IMPRESSION: 1. Endotracheal tube tip about 3.1 cm superior to carina 2. Slightly improved aeration on the right. 3. Cardiomegaly with vascular congestion and pulmonary edema. Asymmetric opacity left thorax likely due to layering pleural effusion. Dense left lung base atelectasis or pneumonia Electronically Signed   By: Donavan Foil M.D.   On: 08/05/2019 04:02      STUDIES:  CT head without contrast 10/14>> negative Echocardiogram 10/14>> EEG 10/14>> Urine Drug Screen 10/14>>  CULTURES: SARS-CoV-2 PCR 10/12>> negative Blood x210/12>> MRSA PCR 10/13>> negative Strep pneumo urinary antigen 10/14>> Legionella urinary antigen 10/14>> Tracheal aspirate 10/14>>  ANTIBIOTICS: Azithromycin 10/13>>10/14 Ceftriaxone 10/13>>10/14 Cefepime 10/14>> Vancomycin 10/14>>  SIGNIFICANT EVENTS: 10/13>> admitted as telemetry patient 10/14>> cardiac arrest 10/14>> CT Head negative 10/14>> targeted temperature management initiated 10/14>> cardiology and neurology consulted; patient made DNR  LINES/TUBES: ET tube 10/14>> Left femoral CVC 10/14>>   ASSESSMENT / PLAN:  PULMONARY A: -Respiratory Arrest, likely secondary to suspected Pulmonary Embolism -Acute hypoxic respiratory failure in the setting of pulmonary edema/volume overload, questionable pneumonia, ? PE P:   Full vent support Titrate FiO2 and PEEP as tolerated Follow intermittent ABG and chest x-ray as needed Dialysis as tolerated for volume removal Will d/c Azithromycin & Rocephin and broaden to Cefepime & Vancomycin PRN bronchodilators Check D-Dimer ~ >7500 Have been unable to obtain  CTA Chest vs. V/Q scan due to hemodynamic instability>>will place on Heparin drip for now for presumed PE Spontaneous breathing trial when parameters met  CARDIOVASCULAR A:  -Cardiac arrest, likely secondary to respiratory arrest -Hypotension in setting of cardiogenic shock vs. Obstructive shock due to PE vs. Septic shock due to PNA P:  Continuous cardiac monitoring Maintain map greater than 65 Levophed as needed to maintain map goal Hold antihypertensives Stat EKG>> EKG without obvious ST segment depression or elevation Initiate Targeted Temperature management 36 C Trend troponin Consult cardiology, appreciate input Echocardiogram pending Heparin drip  RENAL A:   End-stage renal disease  on hemodialysis P:   Monitor I&O's / urinary output Follow BMP Ensure adequate renal perfusion Avoid nephrotoxic agents as able Replace electrolytes as indicated Nephrology following, appreciate input ~hemodialysis as per nephrology  GASTROINTESTINAL A:   Mildly elevated LFT's P:   N.p.o. OG tube to low intermittent suction Protonix IV for stress ulcer prophylaxis Trend CMP  HEMATOLOGIC A:   Anemia  Thrombocytopenia P:  Monitor for S/Sx of bleeding Trend CBC Heparin drip for Anticoagulation/VTE Prophylaxis  Transfuse for Hgb <7    INFECTIOUS A:   Questionable pneumonia P:   Monitor fever curve Trend WBCs and procalcitonin Follow pan-cultures as above D/c Azithromycin & Rocephin; will broaden coverage to Cefepime & Vancomycin for now COVID-19 PCR is negative  ENDOCRINE A:   Hyperglycemia P:   CBGs SSI Follow ICU hypo-hyperglycemia protocol  NEUROLOGIC A:   Unresponsive status post cardiac arrest>> postarrest patient with dilated pupils, decorticate posturing, and rhythmic tongue movements P:   RASS goal: 0 to -1 Fentanyl with Propofol as needed to maintain RASS goal Stat head CT>> negative Place on targeted temperature management 36 C  Place on IV Keppra Consult neurology, appreciate input EEG Provide supportive care Daily wake-up assessment (once TTM complete) Urine drug screen pending   FAMILY  - Updates: Updated patient's father at bedside.  He understands his son's Prognosis is guarded, and he is at high risk of cardiac arrest and death.  His father wishes to make him a DNR/DNI.  - Inter-disciplinary family meet or Palliative Care meeting due by:  08/12/2019    Darel Hong, AGACNP-BC [place] Pager: [phone] Cell: [phone]  08/05/2019, 4:17 AM

## 2019-08-05 NOTE — Progress Notes (Signed)
Pt's Father Victorino Lennon has come to bedside to see son.  Discussed events of cardiac arrest and plan for TTM.  He understands pt's poor prognosis.  He is agreeable to making pt DNR/DNI, but would like to continue current supportive care/treatment measures for now.  Orders placed for DNR/DNI.  All questions answered.   Darel Hong, AGACNP-BC [facility] Pager: [phone] Cell: [phone]

## 2019-08-05 NOTE — Progress Notes (Signed)
ANTICOAGULATION CONSULT NOTE  Pharmacy Consult for Heparin Indication: ACS/STEMI  Patient Measurements: Height: 5' 0.98" (154.9 cm) Weight: 95 lb 10.9 oz (43.4 kg) IBW/kg (Calculated) : 52.26 HEPARIN DW (KG): 43.4  Vital Signs: Temp: 95.7 F (35.4 C) (10/14 1000) Temp Source: Rectal (10/14 1000) BP: 102/82 (10/14 1200) Pulse Rate: 73 (10/14 1200)  Labs: Recent Labs    08/03/19 1119  08/04/19 0349 08/04/19 0845 08/05/19 0431 08/05/19 0744 08/05/19 0906 08/05/19 1132  HGB  --    < > 9.2*  --  8.2*  --   --  8.2*  HCT  --    < > 28.8*  --  26.8*  --   --  26.3*  PLT  --    < > 150  --  135*  --   --  134*  APTT  --   --   --   --   --   --  59*  --   LABPROT  --   --   --   --  17.3*  --  18.2*  --   INR  --   --   --   --  1.4*  --  1.5*  --   CREATININE  --    < >  --  2.86* 2.63*  --  2.87*  --   TROPONINIHS 47*  --   --   --  31* 72*  --   --    < > = values in this interval not displayed.   Estimated Creatinine Clearance: 20 mL/min (A) (by C-G formula based on SCr of 2.87 mg/dL (H)).  Medical History: Past Medical History:  Diagnosis Date  . ESRD (end stage renal disease) ([place])   . Hypertension   . PAD (peripheral artery disease) ([facility])    Required aortofemoral stent-which had closed and had to redo the procedure and ischemia of limb.  . Peripheral vascular disease ([place])   . Renal disorder   . Secondary hyperparathyroidism of renal origin [facility])    Medications:  Medications Prior to Admission  Medication Sig Dispense Refill Last Dose  . amLODipine (NORVASC) 10 MG tablet Take 1 tablet (10 mg total) by mouth daily at 8 pm. 30 tablet 2 unknown at unknown  . apixaban (ELIQUIS) 2.5 MG TABS tablet Take 1 tablet (2.5 mg total) by mouth 2 (two) times daily. 60 tablet 1 unknown at unknown  . aspirin EC 81 MG tablet Take 1 tablet (81 mg total) by mouth daily. 30 tablet 1 unknown at unknown  . atorvastatin (LIPITOR) 80 MG tablet Take 1 tablet (80 mg total) by mouth at  bedtime. 30 tablet 1 unknown at unknown  . b complex-C-folic acid 1 MG capsule Take 1 capsule by mouth daily after supper.    unknown at unknown  . carvedilol (COREG) 25 MG tablet Take 1 tablet (25 mg total) by mouth 2 (two) times daily with a meal. 60 tablet 1 unknown at unknown  . gabapentin (NEURONTIN) 100 MG capsule Take 1 capsule (100 mg total) by mouth 3 (three) times daily. 90 capsule 1 unknown at unknown  . hydrALAZINE (APRESOLINE) 25 MG tablet Take 1 tablet (25 mg total) by mouth every 8 (eight) hours. 90 tablet 1 unknown at unknown  . hydrOXYzine (ATARAX/VISTARIL) 25 MG tablet Take 25 mg by mouth 3 (three) times daily as needed for anxiety.   unknown at unknown  . loratadine (CLARITIN) 10 MG tablet Take 1 tablet (10 mg total) by mouth  daily. 30 tablet 0 unknown at unknown  . multivitamin (RENA-VIT) TABS tablet Take 1 tablet by mouth daily. 30 each 1 unknown at unknown  . neomycin-bacitracin-polymyxin (NEOSPORIN) OINT Apply 1 application topically 2 (two) times daily. 56 g 1 unknown at unknown  . Nutritional Supplements (FEEDING SUPPLEMENT, NEPRO CARB STEADY,) LIQD Take 237 mLs by mouth 2 (two) times daily between meals. 237 mL 30 unknown at unknown  . sevelamer carbonate (RENVELA) 800 MG tablet Take 2 tablets (1,600 mg total) by mouth 3 (three) times daily with meals. 180 tablet 1 unknown at unknown    Assessment: Pharmacy asked to initiate Heparin protocol for a suspected PE vs CVA s/p MI this am.  Pt has been on Apixaban w/ last dose being given on 10/13 at 2117.  H&H, PLT trending down: continue to monitor  Heparin Course: 10/14 am initiation: 1000 unit bolus, then 700 units/hr 10/14 1318: inc to 750 units/hr  Goal of Therapy:  aPTT 66-102 seconds Monitor platelets by anticoagulation protocol: Yes   Plan:   aPTT is within goal range but at the lower end of the range: increase rate to 750 units/hr  Check aPTT 6 hours after heparin rate change  CBC in am  Dallie Piles,  PharmD 08/05/2019,12:29 PM

## 2019-08-05 NOTE — Progress Notes (Signed)
[facility] [place] NAME: Brett Small    MR#:  [id number]  DATE OF BIRTH:  [birth year]  SUBJECTIVE:   Patient was doing well yesterday afternoon and asking to be discharged home.  In the middle of the night, he he suddenly became bradycardic and altered. He was noted to be in PEA arrest.  CPR and ACLS were initiated.  Patient was intubated and received multiple rounds of epinephrine, atropine, calcium, and bicarb.  ROSC was obtained after about 10 minutes of ACLS.  REVIEW OF SYSTEMS:  ROS-unable to obtain as patient is currently intubated  DRUG ALLERGIES:   Allergies  Allergen Reactions   Codeine Nausea Only    Patient questioned this (??)   Sulfa Antibiotics Hives and Nausea And Vomiting   VITALS:  Blood pressure 100/72, pulse 70, temperature (!) 96.6 F (35.9 C), temperature source Rectal, resp. rate 20, height 5' 0.98" (1.549 m), weight 43.4 kg, SpO2 100 %. PHYSICAL EXAMINATION:  Physical Exam  GENERAL:  Laying in the bed with no acute distress.  HEENT: Head atraumatic, normocephalic. Pupils equal, round, reactive to light and accommodation. No scleral icterus. Extraocular muscles intact. Oropharynx and nasopharynx clear. ETT in place. NECK:  Supple, no jugular venous distention. No thyroid enlargement. LUNGS: Lungs are clear to auscultation bilaterally. No wheezes, crackles, rhonchi. No use of accessory muscles of respiration.  CARDIOVASCULAR: RRR, S1, S2 normal. No murmurs, rubs, or gallops.  ABDOMEN: Soft, nontender, nondistended. Bowel sounds present.  EXTREMITIES: No pedal edema, cyanosis, or clubbing.  NEUROLOGIC:  Unable to assess-intubated and sedated PSYCHIATRIC: Unable to assess SKIN: No obvious rash, lesion, or ulcer.  LABORATORY PANEL:  Male CBC Recent Labs  Lab 08/05/19 1132  WBC 12.1*  HGB 8.2*  HCT 26.3*  PLT 134*    ------------------------------------------------------------------------------------------------------------------ Chemistries  Recent Labs  Lab 08/05/19 0431 08/05/19 0906  NA 140 137  K 5.0 5.3*  CL 103 100  CO2 28 23  GLUCOSE 164* 129*  BUN 21* 23*  CREATININE 2.63* 2.87*  CALCIUM 9.5 9.1  AST 117*  --   ALT 67*  --   ALKPHOS 94  --   BILITOT 1.2  --    RADIOLOGY:  Ct Head Wo Contrast  Result Date: 08/05/2019 CLINICAL DATA:  Cardiac arrest EXAM: CT HEAD WITHOUT CONTRAST TECHNIQUE: Contiguous axial images were obtained from the base of the skull through the vertex without intravenous contrast. COMPARISON:  None. FINDINGS: Brain: No evidence of acute infarction, hemorrhage, hydrocephalus, extra-axial collection or mass lesion/mass effect. Vascular: No hyperdense vessel or unexpected calcification. Skull: Normal. Negative for fracture or focal lesion. Sinuses/Orbits: No acute finding. IMPRESSION: Negative head CT.  No evidence of anoxic injury Electronically Signed   By: Monte Fantasia M.D.   On: 08/05/2019 06:02   US Venous Img Lower Bilateral  Result Date: 08/05/2019 CLINICAL DATA:  Bilateral lower extremity pain and edema. EXAM: BILATERAL LOWER EXTREMITY VENOUS DOPPLER ULTRASOUND TECHNIQUE: Gray-scale sonography with graded compression, as well as color Doppler and duplex ultrasound were performed to evaluate the lower extremity deep venous systems from the level of the common femoral vein and including the common femoral, femoral, profunda femoral, popliteal and calf veins including the posterior tibial, peroneal and gastrocnemius veins when visible. The superficial great saphenous vein was also interrogated. Spectral Doppler was utilized to evaluate flow at rest and with distal augmentation maneuvers in the common femoral, femoral and popliteal veins. COMPARISON:  None. FINDINGS: RIGHT LOWER EXTREMITY Common  Femoral Vein: No evidence of thrombus. Normal compressibility,  respiratory phasicity and response to augmentation. Saphenofemoral Junction: No evidence of thrombus. Normal compressibility and flow on color Doppler imaging. Profunda Femoral Vein: No evidence of thrombus. Normal compressibility and flow on color Doppler imaging. Femoral Vein: No evidence of thrombus. Normal compressibility, respiratory phasicity and response to augmentation. Popliteal Vein: No evidence of thrombus. Normal compressibility, respiratory phasicity and response to augmentation. Calf Veins: No evidence of thrombus. Normal compressibility and flow on color Doppler imaging. Superficial Great Saphenous Vein: No evidence of thrombus. Normal compressibility. Venous Reflux:  None. Other Findings: No evidence of superficial thrombophlebitis or abnormal fluid collection. LEFT LOWER EXTREMITY Common Femoral Vein: No evidence of thrombus. Normal compressibility, respiratory phasicity and response to augmentation. Saphenofemoral Junction: No evidence of thrombus. Normal compressibility and flow on color Doppler imaging. Profunda Femoral Vein: No evidence of thrombus. Normal compressibility and flow on color Doppler imaging. Femoral Vein: No evidence of thrombus. Normal compressibility, respiratory phasicity and response to augmentation. Popliteal Vein: No evidence of thrombus. Normal compressibility, respiratory phasicity and response to augmentation. Calf Veins: No evidence of thrombus. Normal compressibility and flow on color Doppler imaging. Superficial Great Saphenous Vein: No evidence of thrombus. Normal compressibility. Venous Reflux:  None. Other Findings: No evidence of superficial thrombophlebitis or abnormal fluid collection. IMPRESSION: No evidence of deep venous thrombosis in either lower extremity. Electronically Signed   By: Aletta Edouard M.D.   On: 08/05/2019 14:00   Dg Chest Port 1 View  Result Date: 08/05/2019 CLINICAL DATA:  Intubation EXAM: PORTABLE CHEST 1 VIEW COMPARISON:  08/03/2019,  07/27/2019, 07/21/2019 FINDINGS: Interval intubation, tip of the endotracheal tube is about 3.1 cm superior to the carina. Right-sided central venous catheter tips over the SVC and proximal right atrium. Cardiomegaly with vascular congestion and diffuse interstitial and ground-glass opacity consistent with edema. Small left effusion likely layering. Dense left lung base atelectasis or pneumonia. IMPRESSION: 1. Endotracheal tube tip about 3.1 cm superior to carina 2. Slightly improved aeration on the right. 3. Cardiomegaly with vascular congestion and pulmonary edema. Asymmetric opacity left thorax likely due to layering pleural effusion. Dense left lung base atelectasis or pneumonia Electronically Signed   By: Donavan Foil M.D.   On: 08/05/2019 04:02   Dg Abd Portable 1v  Result Date: 08/05/2019 CLINICAL DATA:  Nasal/orogastric tube placement. EXAM: PORTABLE ABDOMEN - 1 VIEW COMPARISON:  CT, 07/20/2019. FINDINGS: Nasal/orogastric tube passes below the diaphragm. Tip lies in the proximal stomach. Side hole lies in the expected location of the GE junction. Normal bowel gas pattern. Vascular stents in the upper abdomen centrally. IMPRESSION: 1. Nasal/orogastric tube tip in the proximal stomach. Side hole projects at the GE junction. 2. Normal bowel gas pattern. No evidence of a generalized adynamic ileus. No evidence of a bowel obstruction. Electronically Signed   By: Lajean Manes M.D.   On: 08/05/2019 13:59   ASSESSMENT AND PLAN:   PEA arrest- occurred last night.  ROSC obtained after 10 minutes. -Targeted temperature management -Cardiology consulted -On heparin drip for possible PE -Patient is now DNR  Acute hypoxic and hypercapnic respiratory failure- due to above -Vent management per CCM -Currently on cefepime for possible pneumonia  Acute on chronic systolic congestive heart failure -ECHO 10/13 with EF <20% -Cardiology consulted  ESRD on HD TTS- received HD yesterday without any  issues -Nephrology following -Need to consider CRRT  Questionable seizure-patient has tongue twitching and eye fluttering on exam. -Neurology consulted -Started on IV Keppra 500 mg  twice daily -EEG today  All the records are reviewed and case discussed with Care Management/Social Worker. Management plans discussed with the patient, family and they are in agreement.  CODE STATUS: DNR  TOTAL TIME TAKING CARE OF THIS PATIENT: 40 minutes.   More than 50% of the time was spent in counseling/coordination of care: YES  POSSIBLE D/C unknown, DEPENDING ON CLINICAL CONDITION.   Berna Spare Avamarie Crossley M.D on 08/05/2019 at 3:28 PM  Between 7am to 6pm - Pager - [phone]  After 6pm go to [email or web address] - Proofreader  [facility]  Office  [phone]  CC: Primary care physician; Patient, [name]  Note: This dictation was prepared with Dragon dictation along with smaller phrase technology. Any transcriptional errors that result from this process are unintentional.

## 2019-08-05 NOTE — Significant Event (Signed)
Called to bedside by nursing as patient with cardiac arrest.  CPR and ACLS initiated immediately.  During code patient was intubated by myself, and received multiple rounds of epinephrine, atropine, calcium, and bicarbonate.  ROSC was obtained approximately after 10 minutes of ACLS.  See CODE BLUE sheet for full details of code.  Per nursing patient set up in bed, altered.  Then became unresponsive with agonal respirations which eventually led to respiratory arrest.  Respiratory arrest subsequently led to bradycardia and PEA arrest.    Darel Hong, AGACNP-BC [facility] Pager: [phone] Cell: [phone]

## 2019-08-05 NOTE — Consult Note (Signed)
Reason for Consult: Respiratory failure cardiac arrest Referring Physician: Dr. Normand Sloop hospitalist  Brett Small is an 43 y.o. male.  HPI: 43 year old white male history of multiple medical problems end-stage renal disease on hemodialysis hypertension peripheral vascular disease presented with dyspnea wheezing congestion possible atypical pneumonia he was Covid negative patient had dialysis performed at some point during the night patient had acute onset of generalized fatigue weakness patient had a respiratory arrest and subsequent cardiac arrest patient was intubated and sedated patient was found to have significant elevated BNP suggestive of heart failure patient got broad-spectrum antibiotic therapy as well cardiology consult recommended for evaluation  Past Medical History:  Diagnosis Date  . ESRD (end stage renal disease) ([place])   . Hypertension   . PAD (peripheral artery disease) ([facility])    Required aortofemoral stent-which had closed and had to redo the procedure and ischemia of limb.  . Peripheral vascular disease ([place])   . Renal disorder   . Secondary hyperparathyroidism of renal origin [facility])     Past Surgical History:  Procedure Laterality Date  . A/V SHUNT INTERVENTION Left 01/19/2019   Procedure: LEFT UPPER EXTREMITY A/V SHUNTOGRAM / UPPER EXTREMITY ANGIOGRAM;  Surgeon: Algernon Huxley, MD;  Location: [place] [facility];  Service: Cardiovascular;  Laterality: Left;  . A/V SHUNT INTERVENTION Left 03/02/2019   Procedure: A/V SHUNT INTERVENTION;  Surgeon: Algernon Huxley, MD;  Location: [place] [facility];  Service: Cardiovascular;  Laterality: Left;  . AORTA - FEMORAL ARTERY BYPASS GRAFT    . AV FISTULA PLACEMENT Left 12/26/2018   Procedure: INSERTION OF GORE STRETCH VASCULAR 4-7MM X  45CM IN LEFT UPPER ARM;  Surgeon: Marty Heck, MD;  Location: [place];  Service: Vascular;  Laterality: Left;  . DIALYSIS/PERMA CATHETER REMOVAL N/A 02/06/2019   Procedure: DIALYSIS/PERMA CATHETER  REMOVAL;  Surgeon: Algernon Huxley, MD;  Location: [place] [facility];  Service: Cardiovascular;  Laterality: N/A;  . LEFT HEART CATH AND CORONARY ANGIOGRAPHY Right 07/10/2019   Procedure: LEFT HEART CATH AND CORONARY ANGIOGRAPHY;  Surgeon: Dionisio David, MD;  Location: [place] [facility];  Service: Cardiovascular;  Laterality: Right;  . PERIPHERAL VASCULAR THROMBECTOMY Left 07/28/2019   Procedure: Left Upper Extremity Dialysis Access [place];  Surgeon: Katha Cabal, MD;  Location: [place] [facility];  Service: Cardiovascular;  Laterality: Left;    Family History  Problem Relation Age of Onset  . Hypertension Other   . Diabetes Other   . Clotting disorder Father     Social History:  reports that he has been smoking cigarettes. He has a 15.00 pack-year smoking history. He has never used smokeless tobacco. He reports previous alcohol use. He reports current drug use. Frequency: 1.00 time per week. Drug: Marijuana.  Allergies:  Allergies  Allergen Reactions  . Codeine Nausea Only    Patient questioned this (??)  . Sulfa Antibiotics Hives and Nausea And Vomiting    Medications: I have reviewed the patient's current medications.  Results for orders placed or performed during the hospital encounter of 08/03/19 (from the past 48 hour(s))  Hepatitis B surface antigen     Status: None   Collection Time: 08/03/19 10:49 PM  Result Value Ref Range   Hepatitis B Surface Ag NON REACTIVE NON REACTIVE    Comment: Performed at [facility], [address], [place], [place] [address]  HIV Antibody (routine testing w rflx)     Status: None   Collection Time: 08/04/19  1:51  AM  Result Value Ref Range   HIV Screen 4th Generation wRfx NON REACTIVE NON REACTIVE    Comment: Performed at [facility], [address] [place], [place] [address]  CBC     Status: Abnormal   Collection Time: 08/04/19  1:51 AM  Result Value Ref Range   WBC 10.2 4.0 - 10.5 K/uL   RBC 2.92 (L) 4.22 -  5.81 MIL/uL   Hemoglobin 9.2 (L) 13.0 - 17.0 g/dL   HCT 29.2 (L) 39.0 - 52.0 %   MCV 100.0 80.0 - 100.0 fL   MCH 31.5 26.0 - 34.0 pg   MCHC 31.5 30.0 - 36.0 g/dL   RDW 17.0 (H) 11.5 - 15.5 %   Platelets 146 (L) 150 - 400 K/uL   nRBC 0.0 0.0 - 0.2 %    Comment: Performed at [facility], [address] [place], [place] [address]  Creatinine, serum     Status: Abnormal   Collection Time: 08/04/19  1:51 AM  Result Value Ref Range   Creatinine, Ser 1.97 (H) 0.61 - 1.24 mg/dL   GFR calc non Af Amer 40 (L) >60 mL/min   GFR calc Af Amer 46 (L) >60 mL/min    Comment: Performed at [facility], [address] [place], [place] [address]  CBC WITH DIFFERENTIAL     Status: Abnormal   Collection Time: 08/04/19  3:49 AM  Result Value Ref Range   WBC 10.1 4.0 - 10.5 K/uL   RBC 2.88 (L) 4.22 - 5.81 MIL/uL   Hemoglobin 9.2 (L) 13.0 - 17.0 g/dL   HCT 28.8 (L) 39.0 - 52.0 %   MCV 100.0 80.0 - 100.0 fL   MCH 31.9 26.0 - 34.0 pg   MCHC 31.9 30.0 - 36.0 g/dL   RDW 16.8 (H) 11.5 - 15.5 %   Platelets 150 150 - 400 K/uL   nRBC 0.0 0.0 - 0.2 %   Neutrophils Relative % 78 %   Neutro Abs 8.0 (H) 1.7 - 7.7 K/uL   Lymphocytes Relative 11 %   Lymphs Abs 1.1 0.7 - 4.0 K/uL   Monocytes Relative 5 %   Monocytes Absolute 0.5 0.1 - 1.0 K/uL   Eosinophils Relative 4 %   Eosinophils Absolute 0.4 0.0 - 0.5 K/uL   Basophils Relative 1 %   Basophils Absolute 0.1 0.0 - 0.1 K/uL   Immature Granulocytes 1 %   Abs Immature Granulocytes 0.05 0.00 - 0.07 K/uL    Comment: Performed at [facility], [address] [place], [place] [address]  MRSA PCR Screening     Status: None   Collection Time: 08/04/19  7:56 AM   Specimen: Nasopharyngeal  Result Value Ref Range   MRSA by PCR NEGATIVE NEGATIVE    Comment:        The GeneXpert MRSA Assay (FDA approved for NASAL specimens only), is one component of a comprehensive MRSA colonization surveillance program. It is not intended to  diagnose MRSA infection nor to guide or monitor treatment for MRSA infections. Performed at [facility], [place], [name] [address]   Legionella Pneumophila Serogp 1 Ur Ag     Status: None   Collection Time: 08/04/19  7:56 AM  Result Value Ref Range   L. pneumophila Serogp 1 Ur Ag Negative Negative    Comment: (NOTE) Presumptive negative for L. pneumophila serogroup 1 antigen in urine, suggesting no recent or current infection. Legionnaires' disease cannot be ruled  out since other serogroups and species may also cause disease. Performed At: [facility] [place], [place] [id number] Rush Farmer MD [id number]    Source of Sample URINE, RANDOM     Comment: Performed at [facility], [address] [place], [place] [address]  Strep pneumoniae urinary antigen     Status: None   Collection Time: 08/04/19  7:56 AM  Result Value Ref Range   Strep Pneumo Urinary Antigen NEGATIVE NEGATIVE    Comment:        Infection due to S. pneumoniae cannot be absolutely ruled out since the antigen present may be below the detection limit of the test. Performed at [facility], [address] [place], [place] [address]   Renal function panel     Status: Abnormal   Collection Time: 08/04/19  8:45 AM  Result Value Ref Range   Sodium 139 135 - 145 mmol/L   Potassium 3.4 (L) 3.5 - 5.1 mmol/L   Chloride 99 98 - 111 mmol/L   CO2 26 22 - 32 mmol/L   Glucose, Bld 82 70 - 99 mg/dL   BUN 18 6 - 20 mg/dL   Creatinine, Ser 2.86 (H) 0.61 - 1.24 mg/dL   Calcium 8.3 (L) 8.9 - 10.3 mg/dL   Phosphorus 3.9 2.5 - 4.6 mg/dL   Albumin 2.7 (L) 3.5 - 5.0 g/dL   GFR calc non Af Amer 25 (L) >60 mL/min   GFR calc Af Amer 29 (L) >60 mL/min   Anion gap 14 5 - 15    Comment: Performed at [facility], [address] [place],  [address]  Glucose, capillary     Status: Abnormal   Collection Time: 08/04/19  1:19 PM  Result Value  Ref Range   Glucose-Capillary 101 (H) 70 - 99 mg/dL  Lactic acid, plasma     Status: Abnormal   Collection Time: 08/04/19  2:12 PM  Result Value Ref Range   Lactic Acid, Venous 2.6 (HH) 0.5 - 1.9 mmol/L    Comment: CRITICAL RESULT CALLED TO, READ BACK BY AND VERIFIED WITH TINAILLY GIRALT AT [id number] 08/04/2019 KMP/MJU Performed at [facility], [address] [place], [place] [address]   Lactic acid, plasma     Status: None   Collection Time: 08/04/19  4:53 PM  Result Value Ref Range   Lactic Acid, Venous 1.9 0.5 - 1.9 mmol/L    Comment: Performed at [facility], [address] [place], [place] [address]  Glucose, capillary     Status: Abnormal   Collection Time: 08/05/19  3:08 AM  Result Value Ref Range   Glucose-Capillary 125 (H) 70 - 99 mg/dL  Procalcitonin - Baseline     Status: None   Collection Time: 08/05/19  4:31 AM  Result Value Ref Range   Procalcitonin 4.24 ng/mL    Comment:        Interpretation: PCT > 2 ng/mL: Systemic infection (sepsis) is likely, unless other causes are known. (NOTE)       Sepsis PCT Algorithm           Lower Respiratory Tract                                      Infection PCT Algorithm    ----------------------------     ----------------------------         PCT < 0.25  ng/mL                PCT < 0.10 ng/mL         Strongly encourage             Strongly discourage   discontinuation of antibiotics    initiation of antibiotics    ----------------------------     -----------------------------       PCT 0.25 - 0.50 ng/mL            PCT 0.10 - 0.25 ng/mL               OR       >80% decrease in PCT            Discourage initiation of                                            antibiotics      Encourage discontinuation           of antibiotics    ----------------------------     -----------------------------         PCT >= 0.50 ng/mL              PCT 0.26 - 0.50 ng/mL               AND       <80% decrease in PCT               Encourage initiation of                                             antibiotics       Encourage continuation           of antibiotics    ----------------------------     -----------------------------        PCT >= 0.50 ng/mL                  PCT > 0.50 ng/mL               AND         increase in PCT                  Strongly encourage                                      initiation of antibiotics    Strongly encourage escalation           of antibiotics                                     -----------------------------                                           PCT <= 0.25 ng/mL  OR                                        > 80% decrease in PCT                                     Discontinue / Do not initiate                                             antibiotics Performed at [facility], [address] [place], [place] [address]   CBC with Differential/Platelet     Status: Abnormal   Collection Time: 08/05/19  4:31 AM  Result Value Ref Range   WBC 12.3 (H) 4.0 - 10.5 K/uL   RBC 2.57 (L) 4.22 - 5.81 MIL/uL   Hemoglobin 8.2 (L) 13.0 - 17.0 g/dL   HCT 26.8 (L) 39.0 - 52.0 %   MCV 104.3 (H) 80.0 - 100.0 fL   MCH 31.9 26.0 - 34.0 pg   MCHC 30.6 30.0 - 36.0 g/dL   RDW 17.2 (H) 11.5 - 15.5 %   Platelets 135 (L) 150 - 400 K/uL   nRBC 0.0 0.0 - 0.2 %   Neutrophils Relative % 86 %   Neutro Abs 10.7 (H) 1.7 - 7.7 K/uL   Lymphocytes Relative 6 %   Lymphs Abs 0.7 0.7 - 4.0 K/uL   Monocytes Relative 3 %   Monocytes Absolute 0.4 0.1 - 1.0 K/uL   Eosinophils Relative 3 %   Eosinophils Absolute 0.4 0.0 - 0.5 K/uL   Basophils Relative 1 %   Basophils Absolute 0.1 0.0 - 0.1 K/uL   Immature Granulocytes 1 %   Abs Immature Granulocytes 0.15 (H) 0.00 - 0.07 K/uL    Comment: Performed at [facility], [address] [place], [place] [address]  Comprehensive metabolic panel     Status: Abnormal   Collection Time: 08/05/19   4:31 AM  Result Value Ref Range   Sodium 140 135 - 145 mmol/L   Potassium 5.0 3.5 - 5.1 mmol/L   Chloride 103 98 - 111 mmol/L   CO2 28 22 - 32 mmol/L   Glucose, Bld 164 (H) 70 - 99 mg/dL   BUN 21 (H) 6 - 20 mg/dL   Creatinine, Ser 2.63 (H) 0.61 - 1.24 mg/dL   Calcium 9.5 8.9 - 10.3 mg/dL   Total Protein 6.2 (L) 6.5 - 8.1 g/dL   Albumin 2.6 (L) 3.5 - 5.0 g/dL   AST 117 (H) 15 - 41 U/L   ALT 67 (H) 0 - 44 U/L   Alkaline Phosphatase 94 38 - 126 U/L   Total Bilirubin 1.2 0.3 - 1.2 mg/dL   GFR calc non Af Amer 28 (L) >60 mL/min   GFR calc Af Amer 33 (L) >60 mL/min   Anion gap 9 5 - 15    Comment: Performed at [facility], [address] [place], [place] [address]  Protime-INR     Status: Abnormal   Collection Time: 08/05/19  4:31 AM  Result Value Ref Range   Prothrombin Time 17.3 (H) 11.4 - 15.2 seconds   INR 1.4 (H) 0.8 - 1.2    Comment: (  NOTE) INR goal varies based on device and disease states. Performed at [facility], [place], [place] [address]   Troponin I (High Sensitivity)     Status: Abnormal   Collection Time: 08/05/19  4:31 AM  Result Value Ref Range   Troponin I (High Sensitivity) 31 (H) <18 ng/L    Comment: (NOTE) Elevated high sensitivity troponin I (hsTnI) values and significant  changes across serial measurements may suggest ACS but many other  chronic and acute conditions are known to elevate hsTnI results.  Refer to the "Links" section for chest pain algorithms and additional  guidance. Performed at [facility], [address] [place], [place] [address]   Lactic acid, plasma     Status: Abnormal   Collection Time: 08/05/19  4:31 AM  Result Value Ref Range   Lactic Acid, Venous 3.2 (HH) 0.5 - 1.9 mmol/L    Comment: CRITICAL RESULT CALLED TO, READ BACK BY AND VERIFIED WITH JOSH WILLIAMSON AT 0510 ON 08/05/2019 JJB Performed at [facility], [address], [place], [place] [address]   Brain  natriuretic peptide     Status: Abnormal   Collection Time: 08/05/19  4:31 AM  Result Value Ref Range   B Natriuretic Peptide 3,772.0 (H) 0.0 - 100.0 pg/mL    Comment: Performed at [facility], [address] [place], [place] [address]  Fibrin derivatives D-Dimer [facility] only)     Status: Abnormal   Collection Time: 08/05/19  4:31 AM  Result Value Ref Range   Fibrin derivatives D-dimer ([facility]) >7,500.00 (H) 0.00 - 499.00 ng/mL (FEU)    Comment: (NOTE) <> Exclusion of Venous Thromboembolism (VTE) - OUTPATIENT ONLY   (Emergency Department or [place])   0-499 ng/ml (FEU): With a low to intermediate pretest probability                      for VTE this test result excludes the diagnosis                      of VTE.   >499 ng/ml (FEU) : VTE not excluded; additional work up for VTE is                      required. <> Testing on Inpatients and Evaluation of Disseminated Intravascular   Coagulation (DIC) Reference Range:   0-499 ng/ml (FEU) Performed at [facility], [address] [place], [place] [address]   Triglycerides     Status: None   Collection Time: 08/05/19  4:31 AM  Result Value Ref Range   Triglycerides 62 <150 mg/dL    Comment: Performed at [facility], [address] [place],  [address]  Blood gas, arterial     Status: Abnormal   Collection Time: 08/05/19  6:11 AM  Result Value Ref Range   FIO2 60.00    Delivery systems VENTILATOR    Mode PRESSURE REGULATED VOLUME CONTROL    VT 450 mL   Peep/cpap 5.0 cm H20   pH, Arterial 7.37 7.350 - 7.450   pCO2 arterial 46 32.0 - 48.0 mmHg   pO2, Arterial 79 (L) 83.0 - 108.0 mmHg   Bicarbonate 26.6 20.0 - 28.0 mmol/L   Acid-Base Excess 0.8 0.0 - 2.0 mmol/L   O2 Saturation 95.2 %   Patient temperature 37.0    Collection site RIGHT RADIAL    Sample type ARTERIAL DRAW    Allens  test (pass/fail) PASS PASS   Mechanical Rate 15     Comment: Performed at [facility], [address] [place], [place] [address]  Glucose, capillary     Status: Abnormal   Collection Time: 08/05/19  7:33 AM  Result Value Ref Range   Glucose-Capillary 126 (H) 70 - 99 mg/dL  Lactic acid, plasma     Status: Abnormal   Collection Time: 08/05/19  7:44 AM  Result Value Ref Range   Lactic Acid, Venous 2.2 (HH) 0.5 - 1.9 mmol/L    Comment: CRITICAL VALUE NOTED. VALUE IS CONSISTENT WITH PREVIOUSLY REPORTED/CALLED VALUE JJB Performed at [facility], [place], [place] [address]   Troponin I (High Sensitivity)     Status: Abnormal   Collection Time: 08/05/19  7:44 AM  Result Value Ref Range   Troponin I (High Sensitivity) 72 (H) <18 ng/L    Comment: READ BACK AND VERIFIED WITH [facility] MOORE AT 0818 ON 08/05/2019 JJB (NOTE) Elevated high sensitivity troponin I (hsTnI) values and significant  changes across serial measurements may suggest ACS but many other  chronic and acute conditions are known to elevate hsTnI results.  Refer to the "Links" section for chest pain algorithms and additional  guidance. Performed at [facility], [address] [place], [place] [id number]   Basic metabolic panel     Status: Abnormal   Collection Time: 08/05/19  9:06 AM  Result Value Ref Range   Sodium 137 135 - 145 mmol/L   Potassium 5.3 (H) 3.5 - 5.1 mmol/L   Chloride 100 98 - 111 mmol/L   CO2 23 22 - 32 mmol/L   Glucose, Bld 129 (H) 70 - 99 mg/dL   BUN 23 (H) 6 - 20 mg/dL   Creatinine, Ser 2.87 (H) 0.61 - 1.24 mg/dL   Calcium 9.1 8.9 - 10.3 mg/dL   GFR calc non Af Amer 25 (L) >60 mL/min   GFR calc Af Amer 29 (L) >60 mL/min   Anion gap 14 5 - 15    Comment: Performed at [facility], [address] [place], [place] [address]  Protime-INR now     Status: Abnormal   Collection Time: 08/05/19  9:06 AM  Result Value Ref Range   Prothrombin Time 18.2 (H) 11.4 - 15.2 seconds   INR 1.5 (H) 0.8 - 1.2    Comment: (NOTE) INR goal varies based on device and disease  states. Performed at [facility], [address] [place], [place] [address]   APTT     Status: Abnormal   Collection Time: 08/05/19  9:06 AM  Result Value Ref Range   aPTT 59 (H) 24 - 36 seconds    Comment:        IF BASELINE aPTT IS ELEVATED, SUGGEST PATIENT RISK ASSESSMENT BE USED TO DETERMINE APPROPRIATE ANTICOAGULANT THERAPY. Performed at [facility], [address] [place], [place] [address]   CBC with Differential/Platelet     Status: Abnormal   Collection Time: 08/05/19 11:32 AM  Result Value Ref Range   WBC 12.1 (H) 4.0 - 10.5 K/uL   RBC 2.56 (L) 4.22 - 5.81 MIL/uL   Hemoglobin 8.2 (L) 13.0 - 17.0 g/dL   HCT 26.3 (L) 39.0 - 52.0 %   MCV 102.7 (H) 80.0 - 100.0 fL   MCH 32.0 26.0 - 34.0 pg   MCHC 31.2 30.0 - 36.0 g/dL   RDW 17.2 (H) 11.5 - 15.5 %   Platelets  134 (L) 150 - 400 K/uL   nRBC 0.0 0.0 - 0.2 %   Neutrophils Relative % 80 %   Neutro Abs 9.9 (H) 1.7 - 7.7 K/uL   Lymphocytes Relative 11 %   Lymphs Abs 1.3 0.7 - 4.0 K/uL   Monocytes Relative 5 %   Monocytes Absolute 0.6 0.1 - 1.0 K/uL   Eosinophils Relative 2 %   Eosinophils Absolute 0.2 0.0 - 0.5 K/uL   Basophils Relative 1 %   Basophils Absolute 0.1 0.0 - 0.1 K/uL   Immature Granulocytes 1 %   Abs Immature Granulocytes 0.06 0.00 - 0.07 K/uL    Comment: Performed at [facility] [address] [place], [place] [address]  Glucose, capillary     Status: Abnormal   Collection Time: 08/05/19 11:35 AM  Result Value Ref Range   Glucose-Capillary 68 (L) 70 - 99 mg/dL  Glucose, capillary     Status: None   Collection Time: 08/05/19 11:37 AM  Result Value Ref Range   Glucose-Capillary 70 70 - 99 mg/dL  APTT     Status: Abnormal   Collection Time: 08/05/19  1:18 PM  Result Value Ref Range   aPTT 69 (H) 24 - 36 seconds    Comment:        IF BASELINE aPTT IS ELEVATED, SUGGEST PATIENT RISK ASSESSMENT BE USED TO DETERMINE APPROPRIATE ANTICOAGULANT THERAPY. Performed at  [facility], [place], [place] [address]   Glucose, capillary     Status: None   Collection Time: 08/05/19  2:26 PM  Result Value Ref Range   Glucose-Capillary 80 70 - 99 mg/dL    Ct Head Wo Contrast  Result Date: 08/05/2019 CLINICAL DATA:  Cardiac arrest EXAM: CT HEAD WITHOUT CONTRAST TECHNIQUE: Contiguous axial images were obtained from the base of the skull through the vertex without intravenous contrast. COMPARISON:  None. FINDINGS: Brain: No evidence of acute infarction, hemorrhage, hydrocephalus, extra-axial collection or mass lesion/mass effect. Vascular: No hyperdense vessel or unexpected calcification. Skull: Normal. Negative for fracture or focal lesion. Sinuses/Orbits: No acute finding. IMPRESSION: Negative head CT.  No evidence of anoxic injury Electronically Signed   By: Monte Fantasia M.D.   On: 08/05/2019 06:02   US Venous Img Lower Bilateral  Result Date: 08/05/2019 CLINICAL DATA:  Bilateral lower extremity pain and edema. EXAM: BILATERAL LOWER EXTREMITY VENOUS DOPPLER ULTRASOUND TECHNIQUE: Gray-scale sonography with graded compression, as well as color Doppler and duplex ultrasound were performed to evaluate the lower extremity deep venous systems from the level of the common femoral vein and including the common femoral, femoral, profunda femoral, popliteal and calf veins including the posterior tibial, peroneal and gastrocnemius veins when visible. The superficial great saphenous vein was also interrogated. Spectral Doppler was utilized to evaluate flow at rest and with distal augmentation maneuvers in the common femoral, femoral and popliteal veins. COMPARISON:  None. FINDINGS: RIGHT LOWER EXTREMITY Common Femoral Vein: No evidence of thrombus. Normal compressibility, respiratory phasicity and response to augmentation. Saphenofemoral Junction: No evidence of thrombus. Normal compressibility and flow on color Doppler imaging. Profunda Femoral Vein: No  evidence of thrombus. Normal compressibility and flow on color Doppler imaging. Femoral Vein: No evidence of thrombus. Normal compressibility, respiratory phasicity and response to augmentation. Popliteal Vein: No evidence of thrombus. Normal compressibility, respiratory phasicity and response to augmentation. Calf Veins: No evidence of thrombus. Normal compressibility and flow on color Doppler imaging. Superficial Great Saphenous Vein: No evidence of thrombus. Normal compressibility. Venous Reflux:  None. Other Findings: No evidence of superficial thrombophlebitis or abnormal fluid collection. LEFT LOWER EXTREMITY Common Femoral Vein: No evidence of thrombus. Normal compressibility, respiratory phasicity and response to augmentation. Saphenofemoral Junction: No evidence of thrombus. Normal compressibility and flow on color Doppler imaging. Profunda Femoral Vein: No evidence of thrombus. Normal compressibility and flow on color Doppler imaging. Femoral Vein: No evidence of thrombus. Normal compressibility, respiratory phasicity and response to augmentation. Popliteal Vein: No evidence of thrombus. Normal compressibility, respiratory phasicity and response to augmentation. Calf Veins: No evidence of thrombus. Normal compressibility and flow on color Doppler imaging. Superficial Great Saphenous Vein: No evidence of thrombus. Normal compressibility. Venous Reflux:  None. Other Findings: No evidence of superficial thrombophlebitis or abnormal fluid collection. IMPRESSION: No evidence of deep venous thrombosis in either lower extremity. Electronically Signed   By: Aletta Edouard M.D.   On: 08/05/2019 14:00   Dg Chest Port 1 View  Result Date: 08/05/2019 CLINICAL DATA:  Intubation EXAM: PORTABLE CHEST 1 VIEW COMPARISON:  08/03/2019, 07/27/2019, 07/21/2019 FINDINGS: Interval intubation, tip of the endotracheal tube is about 3.1 cm superior to the carina. Right-sided central venous catheter tips over the SVC and  proximal right atrium. Cardiomegaly with vascular congestion and diffuse interstitial and ground-glass opacity consistent with edema. Small left effusion likely layering. Dense left lung base atelectasis or pneumonia. IMPRESSION: 1. Endotracheal tube tip about 3.1 cm superior to carina 2. Slightly improved aeration on the right. 3. Cardiomegaly with vascular congestion and pulmonary edema. Asymmetric opacity left thorax likely due to layering pleural effusion. Dense left lung base atelectasis or pneumonia Electronically Signed   By: Donavan Foil M.D.   On: 08/05/2019 04:02   Dg Abd Portable 1v  Result Date: 08/05/2019 CLINICAL DATA:  Nasal/orogastric tube placement. EXAM: PORTABLE ABDOMEN - 1 VIEW COMPARISON:  CT, 07/20/2019. FINDINGS: Nasal/orogastric tube passes below the diaphragm. Tip lies in the proximal stomach. Side hole lies in the expected location of the GE junction. Normal bowel gas pattern. Vascular stents in the upper abdomen centrally. IMPRESSION: 1. Nasal/orogastric tube tip in the proximal stomach. Side hole projects at the GE junction. 2. Normal bowel gas pattern. No evidence of a generalized adynamic ileus. No evidence of a bowel obstruction. Electronically Signed   By: Lajean Manes M.D.   On: 08/05/2019 13:59    Review of Systems  Unable to perform ROS: Intubated   Blood pressure 107/77, pulse 72, temperature (!) 96.6 F (35.9 C), temperature source Rectal, resp. rate 20, height 5' 0.98" (1.549 m), weight 43.4 kg, SpO2 100 %. Physical Exam  Nursing note and vitals reviewed. Constitutional: He appears well-developed and well-nourished.  HENT:  Head: Normocephalic and atraumatic.  Eyes: Pupils are equal, round, and reactive to light. Conjunctivae and EOM are normal.  Neck: Normal range of motion. Neck supple.  Cardiovascular: Normal rate and regular rhythm.  Murmur heard. Respiratory: Effort normal and breath sounds normal.  GI: Soft. Bowel sounds are normal.   Musculoskeletal: Normal range of motion.        General: Deformity present.  Neurological: He is unresponsive.  Skin: Skin is warm and dry.    Assessment/Plan: Resp Failure Cardiac Arrest ESRD on dialysis OVD SOB CHF Leukocytosis Elevated BNP Anemia Secondary hyperparathyroidism History of smoking . Plan Agree with ventilatory support Continue medical therapy Broad-spectrum antibiotic therapy Continue hemodialysis Agree with EEG for evaluation Follow-up troponins and EKGs Consider echocardiogram Recommend conservative medical therapy for now  Abdirahim Flavell D Mirabelle Cyphers 08/05/2019, 3:58 PM

## 2019-08-05 NOTE — Progress Notes (Signed)
[facility]  ROUNDING NOTE   Subjective:  Patient significantly worse over the past 24 hours. He underwent dialysis yesterday. He then had cardiac arrest with PEA and apnea. He is now on hypothermia protocol.   Objective:  Vital signs in last 24 hours:  Temp:  [95.2 F (35.1 C)-98.5 F (36.9 C)] 96.8 F (36 C) (10/14 1300) Pulse Rate:  [53-105] 69 (10/14 1330) Resp:  [12-20] 20 (10/14 1330) BP: (87-126)/(67-99) 87/67 (10/14 1330) SpO2:  [96 %-100 %] 100 % (10/14 1330) FiO2 (%):  [60 %-100 %] 60 % (10/14 1330)  Weight change:  Filed Weights   08/04/19 1300  Weight: 43.4 kg    Intake/Output: I/O last 3 completed shifts: In: 496.4 [I.V.:149.8; IV Piggyback:346.6] Out: 4200 [Urine:200; Other:4000]   Intake/Output this shift:  Total I/O In: 609.1 [I.V.:222.4; IV Piggyback:386.8] Out: 4 [Urine:4]  Physical Exam: General: Critically ill-appearing  Head: ETT in place  Eyes: Anicteric  Neck: Supple, trachea midline  Lungs:  Clear to auscultation, vent assisted  Heart: S1S2 no rubs  Abdomen:  Soft, nontender, bowel sounds present  Extremities: No peripheral edema.  Neurologic: Intubated, not following commands  Skin: No lesions  Access: LUE AVF, Right IJ PC    Basic Metabolic Panel: Recent Labs  Lab 08/03/19 0925 08/04/19 0151 08/04/19 0845 08/05/19 0431 08/05/19 0906  NA 134*  --  139 140 137  K 3.2*  --  3.4* 5.0 5.3*  CL 100  --  99 103 100  CO2 21*  --  _0 GLUCOSE 305*  --  82 164* 129*  BUN 29*  --  18 21* 23*  CREATININE 4.19* 1.97* 2.86* 2.63* 2.87*  CALCIUM 8.4*  --  8.3* 9.5 9.1  PHOS  --   --  3.9  --   --     Liver Function Tests: Recent Labs  Lab 08/03/19 0925 08/04/19 0845 08/05/19 0431  AST 28  --  117*  ALT 11  --  67*  ALKPHOS 73  --  94  BILITOT 1.5*  --  1.2  PROT 6.4*  --  6.2*  ALBUMIN 2.9* 2.7* 2.6*   No results for input(s): LIPASE, AMYLASE in the last 168 hours. No results for input(s): AMMONIA in  the last 168 hours.  CBC: Recent Labs  Lab 08/03/19 0925 08/04/19 0151 08/04/19 0349 08/05/19 0431 08/05/19 1132  WBC 17.2* 10.2 10.1 12.3* 12.1*  NEUTROABS 14.1*  --  8.0* 10.7* 9.9*  HGB 10.4* 9.2* 9.2* 8.2* 8.2*  HCT 33.6* 29.2* 28.8* 26.8* 26.3*  MCV 104.7* 100.0 100.0 104.3* 102.7*  PLT 268 146* 150 135* 134*    Cardiac Enzymes: No results for input(s): CKTOTAL, CKMB, CKMBINDEX, TROPONINI in the last 168 hours.  BNP: Invalid input(s): POCBNP  CBG: Recent Labs  Lab 08/04/19 1319 08/05/19 0308 08/05/19 0733 08/05/19 1135 08/05/19 1137  GLUCAP 101* 125* 126* 63* 78    Microbiology: Results for orders placed or performed during the hospital encounter of 08/03/19  SARS Coronavirus 2 by RT PCR (hospital order, performed in [facility] hospital lab) Nasopharyngeal Nasopharyngeal Swab     Status: None   Collection Time: 08/03/19  9:25 AM   Specimen: Nasopharyngeal Swab  Result Value Ref Range Status   SARS Coronavirus 2 NEGATIVE NEGATIVE Final    Comment: (NOTE) If result is NEGATIVE SARS-CoV-2 target nucleic acids are NOT DETECTED. The SARS-CoV-2 RNA is generally detectable in upper and lower  respiratory specimens during the acute  phase of infection. The lowest  concentration of SARS-CoV-2 viral copies this assay can detect is 250  copies / mL. A negative result does not preclude SARS-CoV-2 infection  and should not be used as the sole basis for treatment or other  patient management decisions.  A negative result may occur with  improper specimen collection / handling, submission of specimen other  than nasopharyngeal swab, presence of viral mutation(s) within the  areas targeted by this assay, and inadequate number of viral copies  (<250 copies / mL). A negative result must be combined with clinical  observations, patient history, and epidemiological information. If result is POSITIVE SARS-CoV-2 target nucleic acids are DETECTED. The SARS-CoV-2 RNA is  generally detectable in upper and lower  respiratory specimens dur ing the acute phase of infection.  Positive  results are indicative of active infection with SARS-CoV-2.  Clinical  correlation with patient history and other diagnostic information is  necessary to determine patient infection status.  Positive results do  not rule out bacterial infection or co-infection with other viruses. If result is PRESUMPTIVE POSTIVE SARS-CoV-2 nucleic acids MAY BE PRESENT.   A presumptive positive result was obtained on the submitted specimen  and confirmed on repeat testing.  While 2019 novel coronavirus  (SARS-CoV-2) nucleic acids may be present in the submitted sample  additional confirmatory testing may be necessary for epidemiological  and / or clinical management purposes  to differentiate between  SARS-CoV-2 and other Sarbecovirus currently known to infect humans.  If clinically indicated additional testing with an alternate test  methodology [phone]) is advised. The SARS-CoV-2 RNA is generally  detectable in upper and lower respiratory sp ecimens during the acute  phase of infection. The expected result is Negative. Fact Sheet for Patients:  StrictlyIdeas.no Fact Sheet for Healthcare Providers: BankingDealers.co.za This test is not yet approved or cleared by the [place] FDA and has been authorized for detection and/or diagnosis of SARS-CoV-2 by FDA under an Emergency Use Authorization (EUA).  This EUA will remain in effect (meaning this test can be used) for the duration of the COVID-19 declaration under Section 564(b)(1) of the Act, 21 U.S.C. section 360bbb-3(b)(1), unless the authorization is terminated or revoked sooner. Performed at [facility], [address] [place], [place] [address]   Blood culture (routine x 2)     Status: None (Preliminary result)   Collection Time: 08/03/19 10:05 AM   Specimen: BLOOD  Result  Value Ref Range Status   Specimen Description BLOOD BLOOD RIGHT FOREARM  Final   Special Requests   Final    BOTTLES DRAWN AEROBIC AND ANAEROBIC Blood Culture adequate volume   Culture   Final    NO GROWTH 2 DAYS Performed at [facility], [address] [place], [place] [address]    Report Status PENDING  Incomplete  MRSA PCR Screening     Status: None   Collection Time: 08/04/19  7:56 AM   Specimen: Nasopharyngeal  Result Value Ref Range Status   MRSA by PCR NEGATIVE NEGATIVE Final    Comment:        The GeneXpert MRSA Assay (FDA approved for NASAL specimens only), is one component of a comprehensive MRSA colonization surveillance program. It is not intended to diagnose MRSA infection nor to guide or monitor treatment for MRSA infections. Performed at [facility], [address], [place], [place] [address]     Coagulation Studies: Recent Labs    08/05/19 0431 08/05/19 0906  LABPROT 17.3*  18.2*  INR 1.4* 1.5*    Urinalysis: No results for input(s): COLORURINE, LABSPEC, PHURINE, GLUCOSEU, HGBUR, BILIRUBINUR, KETONESUR, PROTEINUR, UROBILINOGEN, NITRITE, LEUKOCYTESUR in the last 72 hours.  Invalid input(s): APPERANCEUR    Imaging: Ct Head Wo Contrast  Result Date: 08/05/2019 CLINICAL DATA:  Cardiac arrest EXAM: CT HEAD WITHOUT CONTRAST TECHNIQUE: Contiguous axial images were obtained from the base of the skull through the vertex without intravenous contrast. COMPARISON:  None. FINDINGS: Brain: No evidence of acute infarction, hemorrhage, hydrocephalus, extra-axial collection or mass lesion/mass effect. Vascular: No hyperdense vessel or unexpected calcification. Skull: Normal. Negative for fracture or focal lesion. Sinuses/Orbits: No acute finding. IMPRESSION: Negative head CT.  No evidence of anoxic injury Electronically Signed   By: Monte Fantasia M.D.   On: 08/05/2019 06:02   Dg Chest Port 1 View  Result Date: 08/05/2019 CLINICAL DATA:   Intubation EXAM: PORTABLE CHEST 1 VIEW COMPARISON:  08/03/2019, 07/27/2019, 07/21/2019 FINDINGS: Interval intubation, tip of the endotracheal tube is about 3.1 cm superior to the carina. Right-sided central venous catheter tips over the SVC and proximal right atrium. Cardiomegaly with vascular congestion and diffuse interstitial and ground-glass opacity consistent with edema. Small left effusion likely layering. Dense left lung base atelectasis or pneumonia. IMPRESSION: 1. Endotracheal tube tip about 3.1 cm superior to carina 2. Slightly improved aeration on the right. 3. Cardiomegaly with vascular congestion and pulmonary edema. Asymmetric opacity left thorax likely due to layering pleural effusion. Dense left lung base atelectasis or pneumonia Electronically Signed   By: Donavan Foil M.D.   On: 08/05/2019 04:02     Medications:   . sodium chloride    . sodium chloride    . sodium chloride    . sodium chloride    . [START ON 08/06/2019] ceFEPime (MAXIPIME) IV    . DOPamine Stopped (08/05/19 0700)  . fentaNYL infusion INTRAVENOUS Stopped (08/05/19 1025)  . heparin 700 Units/hr (08/05/19 1300)  . levETIRAcetam Stopped (08/05/19 0911)  . norepinephrine (LEVOPHED) Adult infusion Stopped (08/05/19 1043)  . propofol (DIPRIVAN) infusion 30 mcg/kg/min (08/05/19 1300)   . aspirin EC  81 mg Oral Daily  . atorvastatin  80 mg Oral q1800  . chlorhexidine gluconate (MEDLINE KIT)  15 mL Mouth Rinse BID  . Chlorhexidine Gluconate Cloth  6 each Topical Q0600  . feeding supplement (NEPRO CARB STEADY)  237 mL Oral BID BM  . gabapentin  100 mg Oral TID  . hydrocortisone sod succinate (SOLU-CORTEF) inj  100 mg Intravenous Q12H  . insulin aspart  0-9 Units Subcutaneous Q4H  . ipratropium-albuterol  3 mL Nebulization Q6H  . loratadine  10 mg Oral Daily  . mouth rinse  15 mL Mouth Rinse 10 times per day  . multivitamin  1 tablet Oral QHS  . multivitamin  1 tablet Oral Daily  . pantoprazole (PROTONIX) IV  40  mg Intravenous QHS  . sevelamer carbonate  1,600 mg Oral TID WC  . sodium chloride flush  3 mL Intravenous Q12H   sodium chloride, sodium chloride, sodium chloride, acetaminophen, albuterol, alteplase, heparin, hydrOXYzine, labetalol, lidocaine (PF), lidocaine-prilocaine, nitroGLYCERIN, ondansetron (ZOFRAN) IV, oxyCODONE, pentafluoroprop-tetrafluoroeth, promethazine, sodium chloride flush, zolpidem  Assessment/ Plan:  43 y.o. male with end-stage renal disease on hemodialysis, hypertension, peripheral vascular disease, depression.  [facility]    1.  ESRD on HD TTS.  Patient had hemodialysis yesterday.  Unfortunately he suffered cardiac arrest sometime after dialysis yesterday.  No urgent indication for dialysis at the moment.  If  ongoing aggressive care is desired we may need to consider CRRT tomorrow.  2.  Acute respiratory failure/PEA arrest.  Patient currently on the ventilator.  He is also on hypothermia protocol for the arrest.  Management as per critical care.  3.  Anemia of chronic kidney disease.  Hemoglobin down to 8.2.  Continue to monitor closely.  4.  Secondary hyperparathyroidism.  Renvela on hold at the moment as he is not eating orally at this time.   LOS: 1 Jarvin Ogren 10/14/20201:59 PM

## 2019-08-05 NOTE — Progress Notes (Signed)
eeg completed ° °

## 2019-08-05 NOTE — Progress Notes (Signed)
At approx 0250 pt was found to be diaphoretic and unresponsive to verbal/painful stimuli. Pt soon developed agonal respirations and heart rate became bradycardic in the 20s, HR was initially responsive to atropine but continued to drop heart rate with subsequent dose. Pulse was lost and pt was coded for about 10 min and received 3x epi, 1x calcium, and 1x bicarb amp. Pt was intubated during code and ROSC was achieved. STAT labs, CXR, and head CT ordered. Pt blood pressure currently stabilized with levophed gtt. Pt safely transported to CT with no complications. Will continue to monitor.

## 2019-08-05 NOTE — Progress Notes (Signed)
ANTICOAGULATION CONSULT NOTE - Initial Consult  Pharmacy Consult for Heparin Indication: pulmonary embolus  Allergies  Allergen Reactions  . Codeine Nausea Only    Patient questioned this (??)  . Sulfa Antibiotics Hives and Nausea And Vomiting    Patient Measurements: Height: 5' 0.98" (154.9 cm) Weight: 95 lb 10.9 oz (43.4 kg) IBW/kg (Calculated) : 52.26 HEPARIN DW (KG): 43.4  Vital Signs: Temp: 98.5 F (36.9 C) (10/13 2000) Temp Source: Oral (10/13 2000) BP: 111/90 (10/13 2114) Pulse Rate: 105 (10/13 2000)  Labs: Recent Labs    08/03/19 0925 08/03/19 1119 08/04/19 0151 08/04/19 0349 08/04/19 0845 08/05/19 0431  HGB 10.4*  --  9.2* 9.2*  --  8.2*  HCT 33.6*  --  29.2* 28.8*  --  26.8*  PLT 268  --  146* 150  --  135*  LABPROT  --   --   --   --   --  17.3*  INR  --   --   --   --   --  1.4*  CREATININE 4.19*  --  1.97*  --  2.86* 2.63*  TROPONINIHS 22* 47*  --   --   --  31*   Estimated Creatinine Clearance: 21.8 mL/min (A) (by C-G formula based on SCr of 2.63 mg/dL (H)).  Medical History: Past Medical History:  Diagnosis Date  . ESRD (end stage renal disease) ([place])   . Hypertension   . PAD (peripheral artery disease) ([facility])    Required aortofemoral stent-which had closed and had to redo the procedure and ischemia of limb.  . Peripheral vascular disease ([place])   . Renal disorder   . Secondary hyperparathyroidism of renal origin [facility])    Medications:  Medications Prior to Admission  Medication Sig Dispense Refill Last Dose  . amLODipine (NORVASC) 10 MG tablet Take 1 tablet (10 mg total) by mouth daily at 8 pm. 30 tablet 2 unknown at unknown  . apixaban (ELIQUIS) 2.5 MG TABS tablet Take 1 tablet (2.5 mg total) by mouth 2 (two) times daily. 60 tablet 1 unknown at unknown  . aspirin EC 81 MG tablet Take 1 tablet (81 mg total) by mouth daily. 30 tablet 1 unknown at unknown  . atorvastatin (LIPITOR) 80 MG tablet Take 1 tablet (80 mg total) by mouth at bedtime. 30  tablet 1 unknown at unknown  . b complex-C-folic acid 1 MG capsule Take 1 capsule by mouth daily after supper.    unknown at unknown  . carvedilol (COREG) 25 MG tablet Take 1 tablet (25 mg total) by mouth 2 (two) times daily with a meal. 60 tablet 1 unknown at unknown  . gabapentin (NEURONTIN) 100 MG capsule Take 1 capsule (100 mg total) by mouth 3 (three) times daily. 90 capsule 1 unknown at unknown  . hydrALAZINE (APRESOLINE) 25 MG tablet Take 1 tablet (25 mg total) by mouth every 8 (eight) hours. 90 tablet 1 unknown at unknown  . hydrOXYzine (ATARAX/VISTARIL) 25 MG tablet Take 25 mg by mouth 3 (three) times daily as needed for anxiety.   unknown at unknown  . loratadine (CLARITIN) 10 MG tablet Take 1 tablet (10 mg total) by mouth daily. 30 tablet 0 unknown at unknown  . multivitamin (RENA-VIT) TABS tablet Take 1 tablet by mouth daily. 30 each 1 unknown at unknown  . neomycin-bacitracin-polymyxin (NEOSPORIN) OINT Apply 1 application topically 2 (two) times daily. 56 g 1 unknown at unknown  . Nutritional Supplements (FEEDING SUPPLEMENT, NEPRO CARB STEADY,) LIQD Take 237  mLs by mouth 2 (two) times daily between meals. 237 mL 30 unknown at unknown  . sevelamer carbonate (RENVELA) 800 MG tablet Take 2 tablets (1,600 mg total) by mouth 3 (three) times daily with meals. 180 tablet 1 unknown at unknown    Assessment: Pharmacy asked to initiate Heparin protocol for PE.  Pt has been on Apixaban w/ last dose being given on 10/13 at 2117.  Labs noted.  Goal of Therapy:  aPTT 66-102 seconds Monitor platelets by anticoagulation protocol: Yes   Plan:  Heparin 1000 unit bolus x 1 (reduced due to recent apixaban dose and elevated coag labs) Heparin infusion at 700 units/hr Check aPTT 6 hours after heparin started Monitor for s/sx bleeding complications  Hart Robinsons A 08/05/2019,6:18 AM

## 2019-08-05 NOTE — Consult Note (Addendum)
Reason for Consult: cardiac arrest  Referring Physician: Dr. Brett Albino  CC: suspected seizure activity   HPI: Brett Small is an 43 y.o. male male with a known history end-stage renal disease on hemodialysis on Tuesday Thursday and Saturday, hypertension and peripheral arterial disease who presented to the emergency room with acute onset of dyspnea with associated dry cough and wheezing which have significantly worsened since this last morning. Patient is s/p cardiac arrest around 3 AM that lasted for about 10 minutes on hypothermia protocol now.  Patient was noted to have tongue twitching and fluttering of eyes that resolved with sedation.   Past Medical History:  Diagnosis Date  . ESRD (end stage renal disease) ([place])   . Hypertension   . PAD (peripheral artery disease) ([facility])    Required aortofemoral stent-which had closed and had to redo the procedure and ischemia of limb.  . Peripheral vascular disease ([place])   . Renal disorder   . Secondary hyperparathyroidism of renal origin [facility])     Past Surgical History:  Procedure Laterality Date  . A/V SHUNT INTERVENTION Left 01/19/2019   Procedure: LEFT UPPER EXTREMITY A/V SHUNTOGRAM / UPPER EXTREMITY ANGIOGRAM;  Surgeon: Algernon Huxley, MD;  Location: [place] CV LAB;  Service: Cardiovascular;  Laterality: Left;  . A/V SHUNT INTERVENTION Left 03/02/2019   Procedure: A/V SHUNT INTERVENTION;  Surgeon: Algernon Huxley, MD;  Location: [place] CV LAB;  Service: Cardiovascular;  Laterality: Left;  . AORTA - FEMORAL ARTERY BYPASS GRAFT    . AV FISTULA PLACEMENT Left 12/26/2018   Procedure: INSERTION OF GORE STRETCH VASCULAR 4-7MM X  45CM IN LEFT UPPER ARM;  Surgeon: Marty Heck, MD;  Location: [place];  Service: Vascular;  Laterality: Left;  . DIALYSIS/PERMA CATHETER REMOVAL N/A 02/06/2019   Procedure: DIALYSIS/PERMA CATHETER REMOVAL;  Surgeon: Algernon Huxley, MD;  Location: [place] CV LAB;  Service: Cardiovascular;  Laterality: N/A;  . LEFT  HEART CATH AND CORONARY ANGIOGRAPHY Right 07/10/2019   Procedure: LEFT HEART CATH AND CORONARY ANGIOGRAPHY;  Surgeon: Dionisio David, MD;  Location: [place] CV LAB;  Service: Cardiovascular;  Laterality: Right;  . PERIPHERAL VASCULAR THROMBECTOMY Left 07/28/2019   Procedure: Left Upper Extremity Dialysis Access Decl[place];  Surgeon: Katha Cabal, MD;  Location: [place] CV LAB;  Service: Cardiovascular;  Laterality: Left;    Family History  Problem Relation Age of Onset  . Hypertension Other   . Diabetes Other   . Clotting disorder Father     Social History:  reports that he has been smoking cigarettes. He has a 15.00 pack-year smoking history. He has never used smokeless tobacco. He reports previous alcohol use. He reports current drug use. Frequency: 1.00 time per week. Drug: Marijuana.  Allergies  Allergen Reactions  . Codeine Nausea Only    Patient questioned this (??)  . Sulfa Antibiotics Hives and Nausea And Vomiting    Medications: I have reviewed the patient's current medications.  ROS: Unable to obtain   Physical Examination: Blood pressure 99/76, pulse 70, temperature (!) 95.7 F (35.4 C), temperature source Rectal, resp. rate 20, height 5' 0.98" (1.549 m), weight 43.4 kg, SpO2 100 %.  Patient is sedated, not following commands R pupils 3 mm and left is 10mm but R one appears post surgical  Extensor posturing in his upper extremities.    Laboratory Studies:   Basic Metabolic Panel: Recent Labs  Lab 08/03/19 0925 08/04/19 0151 08/04/19 0845 08/05/19 0431 08/05/19 0906  NA 134*  --  139 140 137  K 3.2*  --  3.4* 5.0 5.3*  CL 100  --  99 103 100  CO2 21*  --  26 28 23   GLUCOSE 305*  --  82 164* 129*  BUN 29*  --  18 21* 23*  CREATININE 4.19* 1.97* 2.86* 2.63* 2.87*  CALCIUM 8.4*  --  8.3* 9.5 9.1  PHOS  --   --  3.9  --   --     Liver Function Tests: Recent Labs  Lab 08/03/19 0925 08/04/19 0845 08/05/19 0431  AST 28  --  117*  ALT 11  --   67*  ALKPHOS 73  --  94  BILITOT 1.5*  --  1.2  PROT 6.4*  --  6.2*  ALBUMIN 2.9* 2.7* 2.6*   No results for input(s): LIPASE, AMYLASE in the last 168 hours. No results for input(s): AMMONIA in the last 168 hours.  CBC: Recent Labs  Lab 08/03/19 0925 08/04/19 0151 08/04/19 0349 08/05/19 0431 08/05/19 1132  WBC 17.2* 10.2 10.1 12.3* 12.1*  NEUTROABS 14.1*  --  8.0* 10.7* 9.9*  HGB 10.4* 9.2* 9.2* 8.2* 8.2*  HCT 33.6* 29.2* 28.8* 26.8* 26.3*  MCV 104.7* 100.0 100.0 104.3* 102.7*  PLT 268 146* 150 135* 134*    Cardiac Enzymes: No results for input(s): CKTOTAL, CKMB, CKMBINDEX, TROPONINI in the last 168 hours.  BNP: Invalid input(s): POCBNP  CBG: Recent Labs  Lab 08/04/19 1319 08/05/19 0308 08/05/19 0733 08/05/19 1135 08/05/19 1137  GLUCAP 101* 125* 126* 64* 50    Microbiology: Results for orders placed or performed during the hospital encounter of 08/03/19  SARS Coronavirus 2 by RT PCR (hospital order, performed in [facility] hospital lab) Nasopharyngeal Nasopharyngeal Swab     Status: None   Collection Time: 08/03/19  9:25 AM   Specimen: Nasopharyngeal Swab  Result Value Ref Range Status   SARS Coronavirus 2 NEGATIVE NEGATIVE Final    Comment: (NOTE) If result is NEGATIVE SARS-CoV-2 target nucleic acids are NOT DETECTED. The SARS-CoV-2 RNA is generally detectable in upper and lower  respiratory specimens during the acute phase of infection. The lowest  concentration of SARS-CoV-2 viral copies this assay can detect is 250  copies / mL. A negative result does not preclude SARS-CoV-2 infection  and should not be used as the sole basis for treatment or other  patient management decisions.  A negative result may occur with  improper specimen collection / handling, submission of specimen other  than nasopharyngeal swab, presence of viral mutation(s) within the  areas targeted by this assay, and inadequate number of viral copies  (<250 copies / mL). A negative  result must be combined with clinical  observations, patient history, and epidemiological information. If result is POSITIVE SARS-CoV-2 target nucleic acids are DETECTED. The SARS-CoV-2 RNA is generally detectable in upper and lower  respiratory specimens dur ing the acute phase of infection.  Positive  results are indicative of active infection with SARS-CoV-2.  Clinical  correlation with patient history and other diagnostic information is  necessary to determine patient infection status.  Positive results do  not rule out bacterial infection or co-infection with other viruses. If result is PRESUMPTIVE POSTIVE SARS-CoV-2 nucleic acids MAY BE PRESENT.   A presumptive positive result was obtained on the submitted specimen  and confirmed on repeat testing.  While 2019 novel coronavirus  (SARS-CoV-2) nucleic acids may be present in the submitted sample  additional confirmatory testing may be necessary for epidemiological  and / or clinical management purposes  to differentiate between  SARS-CoV-2 and other Sarbecovirus currently known to infect humans.  If clinically indicated additional testing with an alternate test  methodology [phone]) is advised. The SARS-CoV-2 RNA is generally  detectable in upper and lower respiratory sp ecimens during the acute  phase of infection. The expected result is Negative. Fact Sheet for Patients:  StrictlyIdeas.no Fact Sheet for Healthcare Providers: BankingDealers.co.za This test is not yet approved or cleared by the [place] FDA and has been authorized for detection and/or diagnosis of SARS-CoV-2 by FDA under an Emergency Use Authorization (EUA).  This EUA will remain in effect (meaning this test can be used) for the duration of the COVID-19 declaration under Section 564(b)(1) of the Act, 21 U.S.C. section 360bbb-3(b)(1), unless the authorization is terminated or revoked sooner. Performed at  [facility], [address] [place], [place] [address]   Blood culture (routine x 2)     Status: None (Preliminary result)   Collection Time: 08/03/19 10:05 AM   Specimen: BLOOD  Result Value Ref Range Status   Specimen Description BLOOD BLOOD RIGHT FOREARM  Final   Special Requests   Final    BOTTLES DRAWN AEROBIC AND ANAEROBIC Blood Culture adequate volume   Culture   Final    NO GROWTH 2 DAYS Performed at [facility], [address], [place], [place] [address]    Report Status PENDING  Incomplete  MRSA PCR Screening     Status: None   Collection Time: 08/04/19  7:56 AM   Specimen: Nasopharyngeal  Result Value Ref Range Status   MRSA by PCR NEGATIVE NEGATIVE Final    Comment:        The GeneXpert MRSA Assay (FDA approved for NASAL specimens only), is one component of a comprehensive MRSA colonization surveillance program. It is not intended to diagnose MRSA infection nor to guide or monitor treatment for MRSA infections. Performed at [facility], [address] [place], [place] [address]     Coagulation Studies: Recent Labs    08/05/19 0431 08/05/19 0906  LABPROT 17.3* 18.2*  INR 1.4* 1.5*    Urinalysis: No results for input(s): COLORURINE, LABSPEC, PHURINE, GLUCOSEU, HGBUR, BILIRUBINUR, KETONESUR, PROTEINUR, UROBILINOGEN, NITRITE, LEUKOCYTESUR in the last 168 hours.  Invalid input(s): APPERANCEUR  Lipid Panel:     Component Value Date/Time   CHOL 112 07/10/2019 0630   TRIG 62 08/05/2019 0431   HDL 43 07/10/2019 0630   CHOLHDL 2.6 07/10/2019 0630   VLDL 14 07/10/2019 0630   LDLCALC 55 07/10/2019 0630    HgbA1C:  Lab Results  Component Value Date   HGBA1C 4.7 (L) 07/09/2019    Urine Drug Screen:  No results found for: LABOPIA, COCAINSCRNUR, LABBENZ, AMPHETMU, THCU, LABBARB  Alcohol Level: No results for input(s): ETH in the last 168 hours.   Imaging: Ct Head Wo Contrast  Result Date: 08/05/2019 CLINICAL DATA:   Cardiac arrest EXAM: CT HEAD WITHOUT CONTRAST TECHNIQUE: Contiguous axial images were obtained from the base of the skull through the vertex without intravenous contrast. COMPARISON:  None. FINDINGS: Brain: No evidence of acute infarction, hemorrhage, hydrocephalus, extra-axial collection or mass lesion/mass effect. Vascular: No hyperdense vessel or unexpected calcification. Skull: Normal. Negative for fracture or focal lesion. Sinuses/Orbits: No acute finding. IMPRESSION: Negative head CT.  No evidence of anoxic injury Electronically Signed   By: Monte Fantasia M.D.   On: 08/05/2019 06:02   Dg Chest Port 1 View  Result Date:  08/05/2019 CLINICAL DATA:  Intubation EXAM: PORTABLE CHEST 1 VIEW COMPARISON:  08/03/2019, 07/27/2019, 07/21/2019 FINDINGS: Interval intubation, tip of the endotracheal tube is about 3.1 cm superior to the carina. Right-sided central venous catheter tips over the SVC and proximal right atrium. Cardiomegaly with vascular congestion and diffuse interstitial and ground-glass opacity consistent with edema. Small left effusion likely layering. Dense left lung base atelectasis or pneumonia. IMPRESSION: 1. Endotracheal tube tip about 3.1 cm superior to carina 2. Slightly improved aeration on the right. 3. Cardiomegaly with vascular congestion and pulmonary edema. Asymmetric opacity left thorax likely due to layering pleural effusion. Dense left lung base atelectasis or pneumonia Electronically Signed   By: Donavan Foil M.D.   On: 08/05/2019 04:02   US Abdomen Limited Ruq  Result Date: 08/03/2019 CLINICAL DATA:  Right upper quadrant pain.  End-stage renal disease. EXAM: ULTRASOUND ABDOMEN LIMITED RIGHT UPPER QUADRANT COMPARISON:  07/20/2019 FINDINGS: Gallbladder: No gallstones or wall thickening visualized. No sonographic Murphy sign noted by sonographer. Common bile duct: Diameter: 4 mm Liver: No focal lesion identified. Within normal limits in parenchymal echogenicity. Portal vein is  patent on color Doppler imaging with normal direction of blood flow towards the liver. Other: Bilateral pleural effusions and small to moderate volume ascites within the visualized abdomen. IMPRESSION: 1. Unremarkable sonographic appearance of the liver and gallbladder. 2. Small to moderate volume ascites. 3. Bilateral pleural effusions. Electronically Signed   By: Davina Poke M.D.   On: 08/03/2019 12:51     Assessment/Plan: 43 y.o. male male with a known history end-stage renal disease on hemodialysis on Tuesday Thursday and Saturday, hypertension and peripheral arterial disease who presented to the emergency room with acute onset of dyspnea with associated dry cough and wheezing which have significantly worsened since this last morning. Patient is s/p cardiac arrest around 3 AM that lasted for about 10 minutes on hypothermia protocol now.  Patient was noted to have tongue twitching and fluttering of eyes that resolved with sedation.   - CTH done this AM no acute abnormalities but will need repeat imaging in 2-3 days - extensor posturing is usually poor prognosticator - propofol restarted - s/p Keppra - On Keppra 500 BID - EEG today ordered and will follow - guarded prognosis right now.  08/05/2019, 11:59 AM

## 2019-08-05 NOTE — Progress Notes (Signed)
Initial Nutrition Assessment  DOCUMENTATION CODES:   Non-severe (moderate) malnutrition in context of chronic illness  INTERVENTION:  If patient remains intubated >24-48 hours recommend initiating tube feeds. If tube feeds are initiated recommend advancing OGT as side port is at GE junction per abdominal x-ray 10/14.  If tube feeds are initiated provide Vital AF 1.2 Cal at 50 mL/hr (1200 mL goal daily volume). Provides 1440 kcal, 90 grams of protein, 972 mL H2O daily.  Also provide a minimum free water flush of 30 mL Q4hrs and B-complex with C QHS if tube feeds are initiated.  NUTRITION DIAGNOSIS:   Moderate Malnutrition related to chronic illness(ESRD on HD, CHF) as evidenced by moderate fat depletion, mild-moderate muscle depletion.  GOAL:   Provide needs based on ASPEN/SCCM guidelines  MONITOR:   Vent status, Labs, Weight trends, I & O's  REASON FOR ASSESSMENT:   Ventilator    ASSESSMENT:   43 year old male with PMHx of HTN, ESRD on HD, secondary hyperparathyroidism, PVD, PAD, CHF with EF <20% who was admitted with volume overload and flash pulmonary edema, but on 10/14 suffered PEA arrest requiring 10 minutes of CPR/ACLS before ROSC was achieved, patient was intubated and placed on 36C TTM protocol.   Patient intubated and sedated. On PRVC mode with FiO2 30% and PEEP 5 cmH2O. Abdomen distended. On 36C TTM protocol and cooling target temperature was achieved at 0700 this morning. Patient's weight appears to be trending down in chart. He was 50.9 kg on 05/21/2019 and is now 43.4 kg (95.68 lbs). He has lost 7.5 kg (14.7% body weight) over the past 3 months, which is significant for time frame if weights in chart are accurate. Unsure if current weight is accurate, though.  Enteral Access: 16 Fr. OGT placed 10/14; terminates in proximal stomach with side port at GE junction per abdominal x-ray 10/14; 50 cm at corner of mouth  MAP: 75-100 mmHg  Patient is currently intubated on  ventilator support Ve: 10.1 L/min Temp (24hrs), Avg:96.7 F (35.9 C), Min:95.2 F (35.1 C), Max:98.5 F (36.9 C)  Propofol: 2.6 mL/hr (69 kcal daily)  Medications reviewed and include: gabapentin, Solu-Cortef 100 mg Q12hrs IV, Novolog 0-9 units Q4hrs, Rena-vit QHS, pantoprazole, Renvela 1600 mg TID, fentanyl gtt, heparin gtt, keppra, norepinephrine gtt now off, propofol gtt.  Labs reviewed: CBG 68-80, Potassium 5.3, BUN 23, Creatinine 2.87.  I/O: 200 mL UOP yesterday  Discussed with RN and on rounds.  NUTRITION - FOCUSED PHYSICAL EXAM:    Most Recent Value  Orbital Region  Moderate depletion  Upper Arm Region  Moderate depletion  Thoracic and Lumbar Region  Mild depletion  Buccal Region  Unable to assess  Temple Region  Moderate depletion  Clavicle Bone Region  Mild depletion  Clavicle and Acromion Bone Region  Mild depletion  Scapular Bone Region  Unable to assess  Dorsal Hand  Mild depletion  Patellar Region  Moderate depletion  Anterior Thigh Region  Moderate depletion  Posterior Calf Region  Moderate depletion  Edema (RD Assessment)  -- [non pitting]  Hair  Reviewed  Eyes  Unable to assess  Mouth  Unable to assess  Skin  Reviewed  Nails  Reviewed     Diet Order:   Diet Order    None     EDUCATION NEEDS:   No education needs have been identified at this time  Skin:  Skin Assessment: Reviewed RN Assessment  Last BM:  Unknown  Height:   Ht Readings from Last 1  Encounters:  08/05/19 5' 0.98" (1.549 m)   Weight:   Wt Readings from Last 1 Encounters:  08/04/19 43.4 kg   Ideal Body Weight:  50.9 kg  BMI:  Body mass index is 18.09 kg/m.  Estimated Nutritional Needs:   Kcal:  1418 (PSU 2003b w/ MSJ 1185, Ve 10.1, Tmax 37)  Protein:  78-88 grams (1.8-2 grams/kg)  Fluid:  UOP + 1 L  Willey Blade, MS, RD, LDN Office: [phone] Pager: [phone] After Hours/Weekend Pager: [phone]

## 2019-08-05 NOTE — Progress Notes (Signed)
Pharmacy Antibiotic Note  Brett Small is a 43 y.o. male admitted on 08/03/2019 with sepsis.  Pharmacy has been consulted for Vancomycin and Cefepime dosing.  Pt w/ ESRD requiring dialysis.  Plan: Cefepime 2gm x 1 - f/u dialysis plans for additional dosing Vancomycin 1gm x 1 - f/u dialysis plans for additional dosing  Height: 5' 0.98" (154.9 cm) Weight: 95 lb 10.9 oz (43.4 kg) IBW/kg (Calculated) : 52.26  Temp (24hrs), Avg:98.4 F (36.9 C), Min:98.2 F (36.8 C), Max:98.5 F (36.9 C)  Recent Labs  Lab 08/03/19 0925 08/04/19 0151 08/04/19 0349 08/04/19 0845 08/04/19 1412 08/04/19 1653 08/05/19 0431  WBC 17.2* 10.2 10.1  --   --   --  12.3*  CREATININE 4.19* 1.97*  --  2.86*  --   --  2.63*  LATICACIDVEN 4.1*  --   --   --  2.6* 1.9 3.2*    Estimated Creatinine Clearance: 21.8 mL/min (A) (by C-G formula based on SCr of 2.63 mg/dL (H)).    Allergies  Allergen Reactions  . Codeine Nausea Only    Patient questioned this (??)  . Sulfa Antibiotics Hives and Nausea And Vomiting   Antimicrobials this admission: Rocephin 10/13 x 1 Azithromycin 10/13-10/14  Cefepime 10/12 x 1, then 10/14 >> Vancomycin 10/14 >>  Dose adjustments this admission:   Microbiology results:  BCx:   UCx:    Sputum:    MRSA PCR:   Thank you for allowing pharmacy to be a part of this patient's care.  Hart Robinsons A 08/05/2019 6:44 AM

## 2019-08-05 NOTE — Progress Notes (Signed)
Code Blue. Called family during event. NP spoke to family and invited them to come to hospital.     08/05/19 0300  Clinical Encounter Type  Visited With Family;Patient  Visit Type Initial;Code  Referral From Nurse

## 2019-08-05 NOTE — Progress Notes (Signed)
Patient continued on Target Temperature Management this shift. For most of the shift the patient was unresponsive with posturing and possible myoclonic twitching. However in the afternoon the patient began to open his eyes and did reach up to his ETT tube. He also had lots of shivering appearing activity and sedation was increased due to target temperature management. During this period where he was more awake he still did not follow commands, turn his head towards stimuli, or seem to register his care team. Towards the end of the shift the color and consistency of his OG contents changed from yellow/green to brown and then to brown with coffee grounds appearance. Dana NP notified and she added pepcid IV and checked a CBC. As of this note results are not back yet.

## 2019-08-05 NOTE — Progress Notes (Addendum)
Name: Brett Small MRN: [id number] DOB: [birth year]     CONSULTATION DATE: 08/05/2019  CHIEF COMPLAINT:  Cardiac arrest  STUDIES/SIGNIFICANT EVENTS: 10/13 - admitted as telemetry patient 10/14 - cardiac arrest 10/14 - CT Head negative 10/14 - targeted temperature management initiated 10/14 - cardiology and neurology consulted; patient made DNR  HISTORY OF PRESENT ILLNESS:   Brett Small is a 43 yo male with a past medical history of ESRD on hemodialysis, hypertension, peripheral vascular disease, and CHF with EF <20% who presented to the ED complaining of shortness of breath, dry cough, and wheezing. He was placed on BiPAP and admitted to the hospital for dialysis for fluid overload and flash pulmonary edema. Pt was then weaned off BiPAP.  On 10/14, pt became suddenly altered and then unresponsive with agonal respirations, leading to respiratory arrest with bradycardia and eventual PEA arrest requiring approximately 10 minutes of CPR/ACLS before ROSC was achieved. Pt was intubated during arrest. Post arrest, pt was still unresponsive with decorticate posturing to pain stimuli. He was placed on targeted temperature management to 36 C and placed on a heparin drip because of possibility of PE given significantly elevated D dimer.   PAST MEDICAL HISTORY :   has a past medical history of ESRD (end stage renal disease) ([place]), Hypertension, PAD (peripheral artery disease) ([place]), Peripheral vascular disease ([place]), Renal disorder, and Secondary hyperparathyroidism of renal origin ([place]).  has a past surgical history that includes Aorta - femoral artery bypass graft; AV fistula placement (Left, 12/26/2018); A/V SHUNT INTERVENTION (Left, 01/19/2019); DIALYSIS/PERMA CATHETER REMOVAL (N/A, 02/06/2019); A/V SHUNT INTERVENTION (Left, 03/02/2019); LEFT HEART CATH AND CORONARY ANGIOGRAPHY (Right, 07/10/2019); and PERIPHERAL VASCULAR THROMBECTOMY (Left, 07/28/2019). Prior to Admission medications   Medication  Sig Start Date End Date Taking? Authorizing Provider  amLODipine (NORVASC) 10 MG tablet Take 1 tablet (10 mg total) by mouth daily at 8 pm. 02/24/19  Yes Gladstone Lighter, MD  apixaban (ELIQUIS) 2.5 MG TABS tablet Take 1 tablet (2.5 mg total) by mouth 2 (two) times daily. 01/26/19  Yes Clapacs, Madie Reno, MD  aspirin EC 81 MG tablet Take 1 tablet (81 mg total) by mouth daily. 01/26/19  Yes Clapacs, Madie Reno, MD  atorvastatin (LIPITOR) 80 MG tablet Take 1 tablet (80 mg total) by mouth at bedtime. 01/26/19  Yes Clapacs, Madie Reno, MD  b complex-C-folic acid 1 MG capsule Take 1 capsule by mouth daily after supper.    Yes [provider]  carvedilol (COREG) 25 MG tablet Take 1 tablet (25 mg total) by mouth 2 (two) times daily with a meal. 01/26/19  Yes Clapacs, Madie Reno, MD  gabapentin (NEURONTIN) 100 MG capsule Take 1 capsule (100 mg total) by mouth 3 (three) times daily. 01/26/19  Yes Clapacs, Madie Reno, MD  hydrALAZINE (APRESOLINE) 25 MG tablet Take 1 tablet (25 mg total) by mouth every 8 (eight) hours. 01/26/19  Yes Clapacs, Madie Reno, MD  hydrOXYzine (ATARAX/VISTARIL) 25 MG tablet Take 25 mg by mouth 3 (three) times daily as needed for anxiety.   Yes [provider]  loratadine (CLARITIN) 10 MG tablet Take 1 tablet (10 mg total) by mouth daily. 03/04/19  Yes Salary, Avel Peace, MD  multivitamin (RENA-VIT) TABS tablet Take 1 tablet by mouth daily. 01/27/19  Yes Clapacs, Madie Reno, MD  neomycin-bacitracin-polymyxin (NEOSPORIN) OINT Apply 1 application topically 2 (two) times daily. 01/26/19  Yes Clapacs, Madie Reno, MD  Nutritional Supplements (FEEDING SUPPLEMENT, NEPRO CARB STEADY,) LIQD Take 237 mLs by mouth 2 (two) times  daily between meals. 07/06/19  Yes Epifanio Lesches, MD  sevelamer carbonate (RENVELA) 800 MG tablet Take 2 tablets (1,600 mg total) by mouth 3 (three) times daily with meals. 01/26/19  Yes Clapacs, Madie Reno, MD   Allergies  Allergen Reactions  . Codeine Nausea Only    Patient questioned this (??)  .  Sulfa Antibiotics Hives and Nausea And Vomiting    REVIEW OF SYSTEMS:   Unable to obtain due to critical illness   VITAL SIGNS: Temp:  [95.7 F (35.4 C)-98.5 F (36.9 C)] 95.7 F (35.4 C) (10/14 0830) Pulse Rate:  [72-105] 72 (10/14 0830) Resp:  [12-20] 20 (10/14 0830) BP: (98-135)/(68-99) 126/86 (10/14 0830) SpO2:  [96 %-100 %] 100 % (10/14 0830) FiO2 (%):  [60 %-100 %] 60 % (10/14 0830) Weight:  [43.4 kg] 43.4 kg (10/13 1300)   I/O last 3 completed shifts: In: 496.4 [I.V.:149.8; IV Piggyback:346.6] Out: 4200 [Urine:200; Other:4000] No intake/output data recorded.   SpO2: 100 % O2 Flow Rate (L/min): 3 L/min FiO2 (%): 60 %  Vent Mode: PRVC FiO2 (%):  [60 %-100 %] 60 % Set Rate:  [15 bmp-20 bmp] 20 bmp Vt Set:  [450 mL-500 mL] 500 mL PEEP:  [5 cmH20] 5 cmH20   Physical Examination:  GENERAL:critically ill appearing, intubated and sedated HEAD: Normocephalic, atraumatic.  EYES: Fixed pupils of grossly different sizes. No scleral icterus.  MOUTH: Moist mucosal membrane. NECK: Supple. No JVD.  PULMONARY: lungs clear to auscultation CARDIOVASCULAR: S1 and S2. Regular rate and rhythm. No murmurs, rubs, or gallops.  GASTROINTESTINAL: Soft, nontender, -distended. No masses. Positive bowel sounds. No hepatosplenomegaly.  MUSCULOSKELETAL: No swelling, clubbing, or edema.  NEUROLOGIC: obtunded, RASS -4 SKIN:intact,warm,dry  I personally reviewed lab work that was obtained in last 24 hrs. CXR Independently reviewed  MEDICATIONS: I have reviewed all medications and confirmed regimen as documented   CULTURE RESULTS   Recent Results (from the past 240 hour(s))  SARS Coronavirus 2 [facility] order, Performed in [facility] hospital lab) Nasopharyngeal Nasopharyngeal Swab     Status: None   Collection Time: 07/27/19  8:16 AM   Specimen: Nasopharyngeal Swab  Result Value Ref Range Status   SARS Coronavirus 2 NEGATIVE NEGATIVE Final    Comment: (NOTE) If result is  NEGATIVE SARS-CoV-2 target nucleic acids are NOT DETECTED. The SARS-CoV-2 RNA is generally detectable in upper and lower  respiratory specimens during the acute phase of infection. The lowest  concentration of SARS-CoV-2 viral copies this assay can detect is 250  copies / mL. A negative result does not preclude SARS-CoV-2 infection  and should not be used as the sole basis for treatment or other  patient management decisions.  A negative result may occur with  improper specimen collection / handling, submission of specimen other  than nasopharyngeal swab, presence of viral mutation(s) within the  areas targeted by this assay, and inadequate number of viral copies  (<250 copies / mL). A negative result must be combined with clinical  observations, patient history, and epidemiological information. If result is POSITIVE SARS-CoV-2 target nucleic acids are DETECTED. The SARS-CoV-2 RNA is generally detectable in upper and lower  respiratory specimens dur ing the acute phase of infection.  Positive  results are indicative of active infection with SARS-CoV-2.  Clinical  correlation with patient history and other diagnostic information is  necessary to determine patient infection status.  Positive results do  not rule out bacterial infection or co-infection with other viruses. If result is PRESUMPTIVE POSTIVE SARS-CoV-2  nucleic acids MAY BE PRESENT.   A presumptive positive result was obtained on the submitted specimen  and confirmed on repeat testing.  While 2019 novel coronavirus  (SARS-CoV-2) nucleic acids may be present in the submitted sample  additional confirmatory testing may be necessary for epidemiological  and / or clinical management purposes  to differentiate between  SARS-CoV-2 and other Sarbecovirus currently known to infect humans.  If clinically indicated additional testing with an alternate test  methodology [phone]) is advised. The SARS-CoV-2 RNA is generally  detectable  in upper and lower respiratory sp ecimens during the acute  phase of infection. The expected result is Negative. Fact Sheet for Patients:  StrictlyIdeas.no Fact Sheet for Healthcare Providers: BankingDealers.co.za This test is not yet approved or cleared by the [place] FDA and has been authorized for detection and/or diagnosis of SARS-CoV-2 by FDA under an Emergency Use Authorization (EUA).  This EUA will remain in effect (meaning this test can be used) for the duration of the COVID-19 declaration under Section 564(b)(1) of the Act, 21 U.S.C. section 360bbb-3(b)(1), unless the authorization is terminated or revoked sooner. Performed at [facility], [address] [place], [place] [address]   SARS Coronavirus 2 by RT PCR (hospital order, performed in [facility] hospital lab) Nasopharyngeal Nasopharyngeal Swab     Status: None   Collection Time: 08/03/19  9:25 AM   Specimen: Nasopharyngeal Swab  Result Value Ref Range Status   SARS Coronavirus 2 NEGATIVE NEGATIVE Final    Comment: (NOTE) If result is NEGATIVE SARS-CoV-2 target nucleic acids are NOT DETECTED. The SARS-CoV-2 RNA is generally detectable in upper and lower  respiratory specimens during the acute phase of infection. The lowest  concentration of SARS-CoV-2 viral copies this assay can detect is 250  copies / mL. A negative result does not preclude SARS-CoV-2 infection  and should not be used as the sole basis for treatment or other  patient management decisions.  A negative result may occur with  improper specimen collection / handling, submission of specimen other  than nasopharyngeal swab, presence of viral mutation(s) within the  areas targeted by this assay, and inadequate number of viral copies  (<250 copies / mL). A negative result must be combined with clinical  observations, patient history, and epidemiological information. If result is  POSITIVE SARS-CoV-2 target nucleic acids are DETECTED. The SARS-CoV-2 RNA is generally detectable in upper and lower  respiratory specimens dur ing the acute phase of infection.  Positive  results are indicative of active infection with SARS-CoV-2.  Clinical  correlation with patient history and other diagnostic information is  necessary to determine patient infection status.  Positive results do  not rule out bacterial infection or co-infection with other viruses. If result is PRESUMPTIVE POSTIVE SARS-CoV-2 nucleic acids MAY BE PRESENT.   A presumptive positive result was obtained on the submitted specimen  and confirmed on repeat testing.  While 2019 novel coronavirus  (SARS-CoV-2) nucleic acids may be present in the submitted sample  additional confirmatory testing may be necessary for epidemiological  and / or clinical management purposes  to differentiate between  SARS-CoV-2 and other Sarbecovirus currently known to infect humans.  If clinically indicated additional testing with an alternate test  methodology [phone]) is advised. The SARS-CoV-2 RNA is generally  detectable in upper and lower respiratory sp ecimens during the acute  phase of infection. The expected result is Negative. Fact Sheet for Patients:  StrictlyIdeas.no Fact Sheet for Healthcare Providers: BankingDealers.co.za This test  is not yet approved or cleared by the [place] and has been authorized for detection and/or diagnosis of SARS-CoV-2 by FDA under an Emergency Use Authorization (EUA).  This EUA will remain in effect (meaning this test can be used) for the duration of the COVID-19 declaration under Section 564(b)(1) of the Act, 21 U.S.C. section 360bbb-3(b)(1), unless the authorization is terminated or revoked sooner. Performed at [facility], [address] [place], [place] [address]   Blood culture (routine x 2)     Status: None  (Preliminary result)   Collection Time: 08/03/19 10:05 AM   Specimen: BLOOD  Result Value Ref Range Status   Specimen Description BLOOD BLOOD RIGHT FOREARM  Final   Special Requests   Final    BOTTLES DRAWN AEROBIC AND ANAEROBIC Blood Culture adequate volume   Culture   Final    NO GROWTH 2 DAYS Performed at [facility], [address] [place], [place] [address]    Report Status PENDING  Incomplete  MRSA PCR Screening     Status: None   Collection Time: 08/04/19  7:56 AM   Specimen: Nasopharyngeal  Result Value Ref Range Status   MRSA by PCR NEGATIVE NEGATIVE Final    Comment:        The GeneXpert MRSA Assay (FDA approved for NASAL specimens only), is one component of a comprehensive MRSA colonization surveillance program. It is not intended to diagnose MRSA infection nor to guide or monitor treatment for MRSA infections. Performed at [facility], [place], [place] [address]           IMAGING    Ct Head Wo Contrast  Result Date: 08/05/2019 CLINICAL DATA:  Cardiac arrest EXAM: CT HEAD WITHOUT CONTRAST TECHNIQUE: Contiguous axial images were obtained from the base of the skull through the vertex without intravenous contrast. COMPARISON:  None. FINDINGS: Brain: No evidence of acute infarction, hemorrhage, hydrocephalus, extra-axial collection or mass lesion/mass effect. Vascular: No hyperdense vessel or unexpected calcification. Skull: Normal. Negative for fracture or focal lesion. Sinuses/Orbits: No acute finding. IMPRESSION: Negative head CT.  No evidence of anoxic injury Electronically Signed   By: Monte Fantasia M.D.   On: 08/05/2019 06:02   Dg Chest Port 1 View  Result Date: 08/05/2019 CLINICAL DATA:  Intubation EXAM: PORTABLE CHEST 1 VIEW COMPARISON:  08/03/2019, 07/27/2019, 07/21/2019 FINDINGS: Interval intubation, tip of the endotracheal tube is about 3.1 cm superior to the carina. Right-sided central venous catheter tips over  the SVC and proximal right atrium. Cardiomegaly with vascular congestion and diffuse interstitial and ground-glass opacity consistent with edema. Small left effusion likely layering. Dense left lung base atelectasis or pneumonia. IMPRESSION: 1. Endotracheal tube tip about 3.1 cm superior to carina 2. Slightly improved aeration on the right. 3. Cardiomegaly with vascular congestion and pulmonary edema. Asymmetric opacity left thorax likely due to layering pleural effusion. Dense left lung base atelectasis or pneumonia Electronically Signed   By: Donavan Foil M.D.   On: 08/05/2019 04:02        Indwelling Urinary Catheter continued, requirement due to   Reason to continue Indwelling Urinary Catheter strict Intake/Output monitoring for hemodynamic instability   Central Line/ continued, requirement due to  Reason to continue [place] of central venous pressure or other hemodynamic parameters and poor IV access   Ventilator continued, requirement due to severe respiratory failure   Ventilator Sedation RASS 0 to -2     ASSESSMENT AND PLAN  43 yo  male with ESRD on HD and CHF with EF <20% admitted to the ICU with acute hypoxic respiratory distress secondary to flash pulmonary edema from CHF exacerbation. Pt had respiratory and eventual cardiac arrest in ICU with PEA that required ACLS resuscitation secondary to differential of PE vs CHF with volume overload vs pneumonia.  Severe ACUTE Hypoxic and Hypercapnic Respiratory Failure -continue Full MV support -continue Bronchodilator Therapy -Wean Fio2 and PEEP as tolerated -will perform SAT/SBT when respiratory parameters are met -possible acute venous pulmonary thromboembolism  Cardiac arrest, with cardiogenic shock ROSC achieved; pt now DNR - TTM at 36C - maintain MAP >65 using vasopressors - Repeat echo pending - follow up cardiology recs  ACUTE on [place]- EF <20% -oxygen as needed -Lasix as  tolerated -follow up cardiac enzymes as indicated -follow up cardiology recs  End stage renal disease on HD -follow BMP -follow UO -continue Foley Catheter-assess need -Avoid nephrotoxic agents -Nephrology consulted, follow up on recs  NEUROLOGY - intubated and off sedation - neurology consult pending Wake up assessment - pt unresponsive off sedation  CARDIAC ICU monitoring  ID -continue IV abx as prescibed -follow up cultures  GI GI PROPHYLAXIS as indicated  NUTRITIONAL STATUS DIET-->TF's as tolerated Constipation protocol as indicated  ENDO - will use ICU hypoglycemic\Hyperglycemia protocol if needed  ELECTROLYTES -follow labs as needed -replace as needed -pharmacy consultation and following  DVT/GI PRX ordered TRANSFUSIONS AS NEEDED MONITOR FSBS ASSESS the need for LABS    Jacques Navy, PA-S  Ottie Glazier, M.D.  Pulmonary & [place]    Critical care provider statement:    Critical care time (minutes):  33   Critical care time was exclusive of:  Separately billable procedures and  treating other patients   Critical care was necessary to treat or prevent imminent or  life-threatening deterioration of the following conditions:   Cardiac arrest, acute hypoxemic respiratory failure, multiple comorbid conditions   Critical care was time spent personally by me on the following  activities:  Development of treatment plan with patient or surrogate,  discussions with consultants, evaluation of patient's response to  treatment, examination of patient, obtaining history from patient or  surrogate, ordering and performing treatments and interventions, ordering  and review of laboratory studies and re-evaluation of patient's condition   I assumed direction of critical care for this patient from another  provider in my specialty: no

## 2019-08-05 NOTE — Progress Notes (Signed)
CODE BLUE note  Date of note: 08/05/2019,  CODE BLUE was called 0300.  The patient became apneic prior to that with subsequent bradycardia and sudden PEA cardiac arrest.  Blood glucose was 125.  CPR was immediately started.  The patient was immediately intubated with 7.5 ET tube at 24 cm at the lip.  He was given 1 mg of IV epinephrine x3, 1 mg of IV atropine x3, 1 ampoule of sodium bicarbonate and 1 ampoule of calcium chloride and wide-open IV normal saline started.  He regained his pulse at 0309 with sinus tachycardia with a rate of [id number] with a systolic blood pressure of 113/77.  She is currently on IV dopamine that will be switched to IV Levophed.  Will check stat labs including CBC, CMP, coag profile and D-dimer and BNP.  His family was briefed about his status and the course of the code.  We will continue other current plan of care is acute CHF and suspected pneumonia.

## 2019-08-05 NOTE — Procedures (Signed)
Intubation Procedure Note Bradford Petrosino FA:[id number] 04-09-1976  Procedure: Intubation Indications: Respiratory insufficiency  Procedure Details Consent: Unable to obtain consent because of emergent medical necessity. Time Out: Verified patient identification, verified procedure, site/side was marked, verified correct patient position, special equipment/implants available, medications/allergies/relevent history reviewed, required imaging and test results available.  Performed  Maximum sterile technique was used including gloves, hand hygiene and mask.  3    Evaluation Hemodynamic Status: BP stable throughout; O2 sats: stable throughout Patient's Current Condition: stable Complications: No apparent complications Patient did tolerate procedure well. Chest X-ray ordered to verify placement.  CXR: tube position acceptable.  Pt did not require sedation as he was unresponsive and intubated during cardiac arrest.  Procedure was performed using GlideScope, allowing direct visualization of passage of ETT through the vocal cords.  ETT placement was verified by color change in CO2 detector, ET CO2, condensation in ETT, bilateral breath sounds, and verified by CXR.    Darel Hong, AGACNP-BC [facility] Pager: [phone] Cell: [phone]    [name] 08/05/2019

## 2019-08-05 NOTE — Progress Notes (Signed)
Pt noted to have rhythmic movement of tongue/lips concerning for questionable seizure activity vs. Myoclonic jerking.  Will place on IV Keppra.  Neuro consult if pending.     Darel Hong, AGACNP-BC [facility] Pager: [phone] Cell: [phone]

## 2019-08-05 NOTE — Procedures (Signed)
Central Venous Catheter Insertion Procedure Note Brett Small FA:[id number] 06/26/1976  Procedure: Insertion of Central Venous Catheter Indications: Assessment of intravascular volume, Drug and/or fluid administration and Frequent blood sampling  Procedure Details Consent: Unable to obtain consent because of emergent medical necessity. Time Out: Verified patient identification, verified procedure, site/side was marked, verified correct patient position, special equipment/implants available, medications/allergies/relevent history reviewed, required imaging and test results available.  Performed  Maximum sterile technique was used including antiseptics, cap, gloves, gown, hand hygiene, mask and sheet. Skin prep: Chlorhexidine; local anesthetic administered A antimicrobial bonded/coated triple lumen catheter was placed in the left femoral vein due to emergent situation using the [place] technique.  Evaluation Blood flow good Complications: No apparent complications Patient did tolerate procedure well. Chest X-ray ordered to verify placement.  CXR: Not needed, placed in left formal vein.   Procedure was performed using Ultrasound for direct visualization of cannulization of left femoral vein.    Darel Hong, AGACNP-BC [facility] Pager: [phone] Cell: [place] 08/05/2019, 4:13 AM

## 2019-08-06 ENCOUNTER — Inpatient Hospital Stay: Payer: Medicare Other

## 2019-08-06 DIAGNOSIS — I469 Cardiac arrest, cause unspecified: Secondary | ICD-10-CM | POA: Diagnosis not present

## 2019-08-06 DIAGNOSIS — R569 Unspecified convulsions: Secondary | ICD-10-CM | POA: Diagnosis not present

## 2019-08-06 LAB — HEPATIC FUNCTION PANEL
ALT: 66 U/L — ABNORMAL HIGH (ref 0–44)
AST: 79 U/L — ABNORMAL HIGH (ref 15–41)
Albumin: 2.4 g/dL — ABNORMAL LOW (ref 3.5–5.0)
Alkaline Phosphatase: 75 U/L (ref 38–126)
Bilirubin, Direct: 0.4 mg/dL — ABNORMAL HIGH (ref 0.0–0.2)
Indirect Bilirubin: 1.3 mg/dL — ABNORMAL HIGH (ref 0.3–0.9)
Total Bilirubin: 1.7 mg/dL — ABNORMAL HIGH (ref 0.3–1.2)
Total Protein: 5.8 g/dL — ABNORMAL LOW (ref 6.5–8.1)

## 2019-08-06 LAB — CBC WITH DIFFERENTIAL/PLATELET
Abs Immature Granulocytes: 0.05 10*3/uL (ref 0.00–0.07)
Basophils Absolute: 0 10*3/uL (ref 0.0–0.1)
Basophils Relative: 0 %
Eosinophils Absolute: 0 10*3/uL (ref 0.0–0.5)
Eosinophils Relative: 0 %
HCT: 22.8 % — ABNORMAL LOW (ref 39.0–52.0)
Hemoglobin: 7.4 g/dL — ABNORMAL LOW (ref 13.0–17.0)
Immature Granulocytes: 1 %
Lymphocytes Relative: 8 %
Lymphs Abs: 0.6 10*3/uL — ABNORMAL LOW (ref 0.7–4.0)
MCH: 31.8 pg (ref 26.0–34.0)
MCHC: 32.5 g/dL (ref 30.0–36.0)
MCV: 97.9 fL (ref 80.0–100.0)
Monocytes Absolute: 0.5 10*3/uL (ref 0.1–1.0)
Monocytes Relative: 6 %
Neutro Abs: 7 10*3/uL (ref 1.7–7.7)
Neutrophils Relative %: 85 %
Platelets: 124 10*3/uL — ABNORMAL LOW (ref 150–400)
RBC: 2.33 MIL/uL — ABNORMAL LOW (ref 4.22–5.81)
RDW: 17.8 % — ABNORMAL HIGH (ref 11.5–15.5)
WBC: 8.2 10*3/uL (ref 4.0–10.5)
nRBC: 0 % (ref 0.0–0.2)

## 2019-08-06 LAB — BASIC METABOLIC PANEL
Anion gap: 16 — ABNORMAL HIGH (ref 5–15)
Anion gap: 10 (ref 5–15)
BUN: 36 mg/dL — ABNORMAL HIGH (ref 6–20)
BUN: 17 mg/dL (ref 6–20)
CO2: 19 mmol/L — ABNORMAL LOW (ref 22–32)
CO2: 26 mmol/L (ref 22–32)
Calcium: 8.7 mg/dL — ABNORMAL LOW (ref 8.9–10.3)
Calcium: 8.2 mg/dL — ABNORMAL LOW (ref 8.9–10.3)
Chloride: 103 mmol/L (ref 98–111)
Chloride: 99 mmol/L (ref 98–111)
Creatinine, Ser: 3.65 mg/dL — ABNORMAL HIGH (ref 0.61–1.24)
Creatinine, Ser: 2.16 mg/dL — ABNORMAL HIGH (ref 0.61–1.24)
GFR calc Af Amer: 22 mL/min — ABNORMAL LOW (ref >60)
GFR calc Af Amer: 41 mL/min — ABNORMAL LOW (ref >60)
GFR calc non Af Amer: 19 mL/min — ABNORMAL LOW (ref >60)
GFR calc non Af Amer: 36 mL/min — ABNORMAL LOW (ref >60)
Glucose, Bld: 112 mg/dL — ABNORMAL HIGH (ref 70–99)
Glucose, Bld: 138 mg/dL — ABNORMAL HIGH (ref 70–99)
Potassium: 4.8 mmol/L (ref 3.5–5.1)
Potassium: 3.6 mmol/L (ref 3.5–5.1)
Sodium: 138 mmol/L (ref 135–145)
Sodium: 135 mmol/L (ref 135–145)

## 2019-08-06 LAB — GLUCOSE, CAPILLARY
Glucose-Capillary: 96 mg/dL (ref 70–99)
Glucose-Capillary: 100 mg/dL — ABNORMAL HIGH (ref 70–99)
Glucose-Capillary: 109 mg/dL — ABNORMAL HIGH (ref 70–99)
Glucose-Capillary: 114 mg/dL — ABNORMAL HIGH (ref 70–99)
Glucose-Capillary: 135 mg/dL — ABNORMAL HIGH (ref 70–99)
Glucose-Capillary: 110 mg/dL — ABNORMAL HIGH (ref 70–99)

## 2019-08-06 LAB — CBC
HCT: 25 % — ABNORMAL LOW (ref 39.0–52.0)
Hemoglobin: 8 g/dL — ABNORMAL LOW (ref 13.0–17.0)
MCH: 31.9 pg (ref 26.0–34.0)
MCHC: 32 g/dL (ref 30.0–36.0)
MCV: 99.6 fL (ref 80.0–100.0)
Platelets: 116 10*3/uL — ABNORMAL LOW (ref 150–400)
RBC: 2.51 MIL/uL — ABNORMAL LOW (ref 4.22–5.81)
RDW: 17.2 % — ABNORMAL HIGH (ref 11.5–15.5)
WBC: 6.9 10*3/uL (ref 4.0–10.5)
nRBC: 0 % (ref 0.0–0.2)

## 2019-08-06 LAB — HEPARIN LEVEL (UNFRACTIONATED): Heparin Unfractionated: 0.85 IU/mL — ABNORMAL HIGH (ref 0.30–0.70)

## 2019-08-06 LAB — STREP PNEUMONIAE URINARY ANTIGEN: Strep Pneumo Urinary Antigen: NEGATIVE

## 2019-08-06 LAB — URINE CULTURE: Culture: NO GROWTH

## 2019-08-06 LAB — MAGNESIUM: Magnesium: 1.6 mg/dL — ABNORMAL LOW (ref 1.7–2.4)

## 2019-08-06 LAB — PROLACTIN: Prolactin: 29.2 ng/mL — ABNORMAL HIGH (ref 4.0–15.2)

## 2019-08-06 LAB — APTT
aPTT: 89 seconds — ABNORMAL HIGH (ref 24–36)
aPTT: 77 seconds — ABNORMAL HIGH (ref 24–36)

## 2019-08-06 LAB — PHOSPHORUS: Phosphorus: 9.1 mg/dL — ABNORMAL HIGH (ref 2.5–4.6)

## 2019-08-06 MED ORDER — MAGNESIUM SULFATE IN D5W 1-5 GM/100ML-% IV SOLN
1.0000 g | Freq: Once | INTRAVENOUS | Status: DC
  Filled 2019-08-06: qty 100

## 2019-08-06 MED ORDER — MIDAZOLAM 50MG/50ML (1MG/ML) PREMIX INFUSION
0.5000 mg/h | INTRAVENOUS | Status: DC
  Administered 2019-08-06: 3 mg/h via INTRAVENOUS
  Administered 2019-08-07: 2 mg/h via INTRAVENOUS
  Filled 2019-08-06 (×2): qty 50

## 2019-08-06 MED ORDER — EPOETIN ALFA 10000 UNIT/ML IJ SOLN
10000.0000 [IU] | INTRAMUSCULAR | Status: DC
  Administered 2019-08-08 – 2019-08-15 (×4): 10000 [IU] via INTRAVENOUS

## 2019-08-06 NOTE — Progress Notes (Signed)
Updated pts father Rangel Cannada via telephone regarding pt condition and current plan of care will continue to monitor and assess pt.  [name], [place] Pager [phone] (please enter 7 digits) PCCM Consult Pager [phone] (please enter 7 digits)

## 2019-08-06 NOTE — Progress Notes (Signed)
ANTICOAGULATION CONSULT NOTE  Pharmacy Consult for Heparin Indication: ACS/STEMI  Patient Measurements: Height: 5' 0.98" (154.9 cm) Weight: 95 lb 10.9 oz (43.4 kg) IBW/kg (Calculated) : 52.26 HEPARIN DW (KG): 43.4  Vital Signs: Temp: 97 F (36.1 C) (10/15 0100) Temp Source: Rectal (10/15 0100) BP: 109/73 (10/15 0045) Pulse Rate: 47 (10/15 0045)  Labs: Recent Labs    08/03/19 1119  08/05/19 0431 08/05/19 0744 08/05/19 0906 08/05/19 1132 08/05/19 1318 08/05/19 1819 08/05/19 2325  HGB  --    < > 8.2*  --   --  8.2*  --  8.3*  --   HCT  --    < > 26.8*  --   --  26.3*  --  26.3*  --   PLT  --    < > 135*  --   --  134*  --  122*  --   APTT  --   --   --   --  59*  --  69*  --  89*  LABPROT  --   --  17.3*  --  18.2*  --   --   --   --   INR  --   --  1.4*  --  1.5*  --   --   --   --   CREATININE  --    < > 2.63*  --  2.87*  --   --  3.44*  --   TROPONINIHS 47*  --  31* 72*  --   --   --   --   --    < > = values in this interval not displayed.   Estimated Creatinine Clearance: 16.6 mL/min (A) (by C-G formula based on SCr of 3.44 mg/dL (H)).  Medical History: Past Medical History:  Diagnosis Date  . ESRD (end stage renal disease) ([place])   . Hypertension   . PAD (peripheral artery disease) ([facility])    Required aortofemoral stent-which had closed and had to redo the procedure and ischemia of limb.  . Peripheral vascular disease ([place])   . Renal disorder   . Secondary hyperparathyroidism of renal origin [facility])    Medications:  Medications Prior to Admission  Medication Sig Dispense Refill Last Dose  . amLODipine (NORVASC) 10 MG tablet Take 1 tablet (10 mg total) by mouth daily at 8 pm. 30 tablet 2 unknown at unknown  . apixaban (ELIQUIS) 2.5 MG TABS tablet Take 1 tablet (2.5 mg total) by mouth 2 (two) times daily. 60 tablet 1 unknown at unknown  . aspirin EC 81 MG tablet Take 1 tablet (81 mg total) by mouth daily. 30 tablet 1 unknown at unknown  . atorvastatin (LIPITOR)  80 MG tablet Take 1 tablet (80 mg total) by mouth at bedtime. 30 tablet 1 unknown at unknown  . b complex-C-folic acid 1 MG capsule Take 1 capsule by mouth daily after supper.    unknown at unknown  . carvedilol (COREG) 25 MG tablet Take 1 tablet (25 mg total) by mouth 2 (two) times daily with a meal. 60 tablet 1 unknown at unknown  . gabapentin (NEURONTIN) 100 MG capsule Take 1 capsule (100 mg total) by mouth 3 (three) times daily. 90 capsule 1 unknown at unknown  . hydrALAZINE (APRESOLINE) 25 MG tablet Take 1 tablet (25 mg total) by mouth every 8 (eight) hours. 90 tablet 1 unknown at unknown  . hydrOXYzine (ATARAX/VISTARIL) 25 MG tablet Take 25 mg by mouth 3 (three) times daily as needed  for anxiety.   unknown at unknown  . loratadine (CLARITIN) 10 MG tablet Take 1 tablet (10 mg total) by mouth daily. 30 tablet 0 unknown at unknown  . multivitamin (RENA-VIT) TABS tablet Take 1 tablet by mouth daily. 30 each 1 unknown at unknown  . neomycin-bacitracin-polymyxin (NEOSPORIN) OINT Apply 1 application topically 2 (two) times daily. 56 g 1 unknown at unknown  . Nutritional Supplements (FEEDING SUPPLEMENT, NEPRO CARB STEADY,) LIQD Take 237 mLs by mouth 2 (two) times daily between meals. 237 mL 30 unknown at unknown  . sevelamer carbonate (RENVELA) 800 MG tablet Take 2 tablets (1,600 mg total) by mouth 3 (three) times daily with meals. 180 tablet 1 unknown at unknown    Assessment: Pharmacy asked to initiate Heparin protocol for a suspected PE vs CVA s/p MI this am.  Pt has been on Apixaban w/ last dose being given on 10/13 at 2117.  H&H, PLT trending down: continue to monitor  Heparin Course: 10/14 am initiation: 1000 unit bolus, then 700 units/hr 10/14 1318: inc to 750 units/hr 10/14 2325 aPTT = 89, therapeutic  Goal of Therapy:  aPTT 66-102 seconds Monitor platelets by anticoagulation protocol: Yes   Plan:   aPTT is within goal range, continue Heparin infusion at 750 units/hr  Check aPTT in  am to confirm  CBC in am  Ena Dawley, PharmD 08/06/2019,1:05 AM

## 2019-08-06 NOTE — Progress Notes (Signed)
Post HD:    08/06/19 1600  Vital Signs  Temp 98.2 F (36.8 C)  Temp Source Rectal  Pulse Rate 81  Pulse Rate Source Monitor  Resp 20  BP 123/67  BP Location Right Arm  BP Method Automatic  Patient Position (if appropriate) Lying  Oxygen Therapy  SpO2 100 %  O2 Device Ventilator  End Tidal CO2 (EtCO2) 27  Critical Care Pain Observation Tool (CPOT)  Facial Expression 0  Body Movements 0  Muscle Tension 0  Compliance with ventilator (intubated pts.) N/A  Vocalization (extubated pts.) N/A  CPOT Total 0  Dialysis Weight  Weight 51.8 kg  Type of Weight Post-Dialysis  Post-Hemodialysis Assessment  Rinseback Volume (mL) 200 mL  KECN 54 V  Dialyzer Clearance Lightly streaked  Duration of HD Treatment -hour(s) 3 hour(s)  Hemodialysis Intake (mL) 450 mL  UF Total -Machine (mL) 1318 mL  Net UF (mL) 868 mL  Tolerated HD Treatment Yes  AVG/AVF Arterial Site Held (minutes)  (n/a)  AVG/AVF Venous Site Held (minutes)  (N/A)  Hemodialysis Catheter Right Internal jugular Double lumen Temporary (Non-Tunneled)  No Placement Date or Time found.   Placed prior to admission: Yes  Orientation: Right  Access Location: Internal jugular  Hemodialysis Catheter Type: Double lumen Temporary (Non-Tunneled)  Site Condition No complications  Blue Lumen Status Heparin locked  Red Lumen Status Heparin locked  Purple Lumen Status N/A  Catheter fill solution Heparin 1000 units/ml  Catheter fill volume (Arterial) 2 cc  Catheter fill volume (Venous) 2.2  Dressing Type Biopatch;Occlusive  Dressing Status Clean;Dry;Intact  Drainage Description None  Post treatment catheter status Capped and Clamped

## 2019-08-06 NOTE — Progress Notes (Signed)
OG advanced to 53cm per verbal order from Dr. Loni Muse based on this mornings CXR

## 2019-08-06 NOTE — Progress Notes (Signed)
Patient ID: Brett Small, male   DOB: 01-Apr-1976, 43 y.o.   MRN: [id number]  [employer] PROGRESS NOTE  Brett Small [name] DOB: [birth year] DOA: 08/03/2019 PCP: Patient, [name]  HPI/Subjective: Patient did resist me trying to open his eyes.  Unable to get any information out of him secondary to being intubated  Objective: Vitals:   08/06/19 1100 08/06/19 1200  BP: 97/67 (!) 96/56  Pulse: 68 70  Resp: 18 20  Temp: 98.2 F (36.8 C) 98.2 F (36.8 C)  SpO2: 100% 100%    Intake/Output Summary (Last 24 hours) at 08/06/2019 1223 Last data filed at 08/06/2019 1143 Gross per 24 hour  Intake 781.96 ml  Output 394 ml  Net 387.96 ml   Filed Weights   08/04/19 1300 08/06/19 0147  Weight: 43.4 kg 49.5 kg    ROS: Review of Systems  Unable to perform ROS: Intubated   Exam: Physical Exam  Constitutional: He is intubated.  HENT:  Unable to look on the mouth  Eyes: Conjunctivae are normal.  Resisted me opening his eyes.  Neck: Trachea normal. Carotid bruit is not present.  Cardiovascular: Regular rhythm, S1 normal, S2 normal and normal heart sounds.  Respiratory: He is intubated. He has decreased breath sounds in the right lower field and the left lower field. He has no wheezes. He has rhonchi in the right lower field and the left lower field. He has no rales.  GI: Soft. Bowel sounds are normal. There is no abdominal tenderness.  Musculoskeletal:     Right ankle: He exhibits no swelling.     Left ankle: He exhibits no swelling.  Neurological:  Intubated and sedated  Skin: Skin is warm. No rash noted. Nails show no clubbing.  Psychiatric:  Intubated and sedated      Data Reviewed: Basic Metabolic Panel: Recent Labs  Lab 08/04/19 0845 08/05/19 0431 08/05/19 0906 08/05/19 1819 08/06/19 0422 08/06/19 1124  NA 139 140 137 137 138  --   K 3.4* 5.0 5.3* 4.7 4.8  --   CL 99 103 100 100 103  --   CO2 _0 19*  --   GLUCOSE 82 164* 129* 98 112*   --   BUN 18 21* 23* 30* 36*  --   CREATININE 2.86* 2.63* 2.87* 3.44* 3.65*  --   CALCIUM 8.3* 9.5 9.1 9.1 8.7*  --   PHOS 3.9  --   --   --   --  9.1*   Liver Function Tests: Recent Labs  Lab 08/03/19 0925 08/04/19 0845 08/05/19 0431 08/06/19 0422  AST 28  --  117* 79*  ALT 11  --  67* 66*  ALKPHOS 73  --  94 75  BILITOT 1.5*  --  1.2 1.7*  PROT 6.4*  --  6.2* 5.8*  ALBUMIN 2.9* 2.7* 2.6* 2.4*   No results for input(s): LIPASE, AMYLASE in the last 168 hours. No results for input(s): AMMONIA in the last 168 hours. CBC: Recent Labs  Lab 08/03/19 0925  08/04/19 0349 08/05/19 0431 08/05/19 1132 08/05/19 1819 08/06/19 0422  WBC 17.2*   < > 10.1 12.3* 12.1* 8.0 6.9  NEUTROABS 14.1*  --  8.0* 10.7* 9.9* 7.1  --   HGB 10.4*   < > 9.2* 8.2* 8.2* 8.3* 8.0*  HCT 33.6*   < > 28.8* 26.8* 26.3* 26.3* 25.0*  MCV 104.7*   < > 100.0 104.3* 102.7* 102.3* 99.6  PLT 268   < >  150 135* 134* 122* 116*   < > = values in this interval not displayed.   BNP (last 3 results) Recent Labs    07/20/19 1029 08/03/19 0925 08/05/19 0431  BNP >4,500.0* >4,500.0* 3,772.0*     CBG: Recent Labs  Lab 08/05/19 2021 08/05/19 2340 08/06/19 0315 08/06/19 0732 08/06/19 1141  GLUCAP 92 96 96 100* 109*    Recent Results (from the past 240 hour(s))  SARS Coronavirus 2 by RT PCR (hospital order, performed in [facility] hospital lab) Nasopharyngeal Nasopharyngeal Swab     Status: None   Collection Time: 08/03/19  9:25 AM   Specimen: Nasopharyngeal Swab  Result Value Ref Range Status   SARS Coronavirus 2 NEGATIVE NEGATIVE Final    Comment: (NOTE) If result is NEGATIVE SARS-CoV-2 target nucleic acids are NOT DETECTED. The SARS-CoV-2 RNA is generally detectable in upper and lower  respiratory specimens during the acute phase of infection. The lowest  concentration of SARS-CoV-2 viral copies this assay can detect is 250  copies / mL. A negative result does not preclude SARS-CoV-2 infection  and  should not be used as the sole basis for treatment or other  patient management decisions.  A negative result may occur with  improper specimen collection / handling, submission of specimen other  than nasopharyngeal swab, presence of viral mutation(s) within the  areas targeted by this assay, and inadequate number of viral copies  (<250 copies / mL). A negative result must be combined with clinical  observations, patient history, and epidemiological information. If result is POSITIVE SARS-CoV-2 target nucleic acids are DETECTED. The SARS-CoV-2 RNA is generally detectable in upper and lower  respiratory specimens dur ing the acute phase of infection.  Positive  results are indicative of active infection with SARS-CoV-2.  Clinical  correlation with patient history and other diagnostic information is  necessary to determine patient infection status.  Positive results do  not rule out bacterial infection or co-infection with other viruses. If result is PRESUMPTIVE POSTIVE SARS-CoV-2 nucleic acids MAY BE PRESENT.   A presumptive positive result was obtained on the submitted specimen  and confirmed on repeat testing.  While 2019 novel coronavirus  (SARS-CoV-2) nucleic acids may be present in the submitted sample  additional confirmatory testing may be necessary for epidemiological  and / or clinical management purposes  to differentiate between  SARS-CoV-2 and other Sarbecovirus currently known to infect humans.  If clinically indicated additional testing with an alternate test  methodology [phone]) is advised. The SARS-CoV-2 RNA is generally  detectable in upper and lower respiratory sp ecimens during the acute  phase of infection. The expected result is Negative. Fact Sheet for Patients:  StrictlyIdeas.no Fact Sheet for Healthcare Providers: BankingDealers.co.za This test is not yet approved or cleared by the [place] FDA and has been  authorized for detection and/or diagnosis of SARS-CoV-2 by FDA under an Emergency Use Authorization (EUA).  This EUA will remain in effect (meaning this test can be used) for the duration of the COVID-19 declaration under Section 564(b)(1) of the Act, 21 U.S.C. section 360bbb-3(b)(1), unless the authorization is terminated or revoked sooner. Performed at [facility], [address] [place], [place] [address]   Blood culture (routine x 2)     Status: None (Preliminary result)   Collection Time: 08/03/19 10:05 AM   Specimen: BLOOD  Result Value Ref Range Status   Specimen Description BLOOD BLOOD RIGHT FOREARM  Final   Special Requests   Final  BOTTLES DRAWN AEROBIC AND ANAEROBIC Blood Culture adequate volume   Culture   Final    NO GROWTH 3 DAYS Performed at [facility], [address] [place], [place] [address]    Report Status PENDING  Incomplete  MRSA PCR Screening     Status: None   Collection Time: 08/04/19  7:56 AM   Specimen: Nasopharyngeal  Result Value Ref Range Status   MRSA by PCR NEGATIVE NEGATIVE Final    Comment:        The GeneXpert MRSA Assay (FDA approved for NASAL specimens only), is one component of a comprehensive MRSA colonization surveillance program. It is not intended to diagnose MRSA infection nor to guide or monitor treatment for MRSA infections. Performed at [facility], [address] [place], [place] [address]   Culture, respiratory (non-expectorated)     Status: None (Preliminary result)   Collection Time: 08/05/19  8:40 PM   Specimen: Tracheal Aspirate; Respiratory  Result Value Ref Range Status   Specimen Description   Final    TRACHEAL ASPIRATE Performed at [facility], [address] [place], [place] [address]    Special Requests   Final    Normal Performed at [facility], [address] [place], [place] [address]    Gram Stain   Final    FEW WBC PRESENT,BOTH PMN AND  MONONUCLEAR NO ORGANISMS SEEN Performed at [facility], [place] [address], [place], [place] [address]    Culture PENDING  Incomplete   Report Status PENDING  Incomplete     Studies: Dg Abd 1 View  Result Date: 08/06/2019 CLINICAL DATA:  Abdominal distension. EXAM: ABDOMEN - 1 VIEW COMPARISON:  None. FINDINGS: The bowel gas pattern is normal. No radio-opaque calculi or other significant radiographic abnormality are seen. Vascular stent noted. Surgical clips and rectal temperature probe are also seen. Defibrillator pad is in place. IMPRESSION: No acute finding. Electronically Signed   By: Inge Rise M.D.   On: 08/06/2019 11:16   Ct Head Wo Contrast  Result Date: 08/05/2019 CLINICAL DATA:  Cardiac arrest EXAM: CT HEAD WITHOUT CONTRAST TECHNIQUE: Contiguous axial images were obtained from the base of the skull through the vertex without intravenous contrast. COMPARISON:  None. FINDINGS: Brain: No evidence of acute infarction, hemorrhage, hydrocephalus, extra-axial collection or mass lesion/mass effect. Vascular: No hyperdense vessel or unexpected calcification. Skull: Normal. Negative for fracture or focal lesion. Sinuses/Orbits: No acute finding. IMPRESSION: Negative head CT.  No evidence of anoxic injury Electronically Signed   By: Monte Fantasia M.D.   On: 08/05/2019 06:02   US Venous Img Lower Bilateral  Result Date: 08/05/2019 CLINICAL DATA:  Bilateral lower extremity pain and edema. EXAM: BILATERAL LOWER EXTREMITY VENOUS DOPPLER ULTRASOUND TECHNIQUE: Gray-scale sonography with graded compression, as well as color Doppler and duplex ultrasound were performed to evaluate the lower extremity deep venous systems from the level of the common femoral vein and including the common femoral, femoral, profunda femoral, popliteal and calf veins including the posterior tibial, peroneal and gastrocnemius veins when visible. The superficial great saphenous vein was also interrogated. Spectral  Doppler was utilized to evaluate flow at rest and with distal augmentation maneuvers in the common femoral, femoral and popliteal veins. COMPARISON:  None. FINDINGS: RIGHT LOWER EXTREMITY Common Femoral Vein: No evidence of thrombus. Normal compressibility, respiratory phasicity and response to augmentation. Saphenofemoral Junction: No evidence of thrombus. Normal compressibility and flow on color Doppler imaging. Profunda Femoral Vein: No evidence of thrombus. Normal  compressibility and flow on color Doppler imaging. Femoral Vein: No evidence of thrombus. Normal compressibility, respiratory phasicity and response to augmentation. Popliteal Vein: No evidence of thrombus. Normal compressibility, respiratory phasicity and response to augmentation. Calf Veins: No evidence of thrombus. Normal compressibility and flow on color Doppler imaging. Superficial Great Saphenous Vein: No evidence of thrombus. Normal compressibility. Venous Reflux:  None. Other Findings: No evidence of superficial thrombophlebitis or abnormal fluid collection. LEFT LOWER EXTREMITY Common Femoral Vein: No evidence of thrombus. Normal compressibility, respiratory phasicity and response to augmentation. Saphenofemoral Junction: No evidence of thrombus. Normal compressibility and flow on color Doppler imaging. Profunda Femoral Vein: No evidence of thrombus. Normal compressibility and flow on color Doppler imaging. Femoral Vein: No evidence of thrombus. Normal compressibility, respiratory phasicity and response to augmentation. Popliteal Vein: No evidence of thrombus. Normal compressibility, respiratory phasicity and response to augmentation. Calf Veins: No evidence of thrombus. Normal compressibility and flow on color Doppler imaging. Superficial Great Saphenous Vein: No evidence of thrombus. Normal compressibility. Venous Reflux:  None. Other Findings: No evidence of superficial thrombophlebitis or abnormal fluid collection. IMPRESSION: No evidence  of deep venous thrombosis in either lower extremity. Electronically Signed   By: Aletta Edouard M.D.   On: 08/05/2019 14:00   Dg Chest Port 1 View  Result Date: 08/06/2019 CLINICAL DATA:  Acute respiratory failure EXAM: PORTABLE CHEST 1 VIEW COMPARISON:  Radiograph 08/05/2019 FINDINGS: Endotracheal tube in the mid trachea, 3 cm from the carina. Transesophageal tube side port is positioned at the GE junction and should be advanced 2-3 cm for optimal function. Dual lumen right IJ approach dialysis catheter tips terminate in the lower SVC. Pacer pads overlie the chest. Cardiac monitoring leads are present as well. Left axillary vascular stent is noted. Diffuse interstitial opacities again seen throughout the lungs. More dense opacities present in the left lung base. Small left effusion remains. No pneumothorax. IMPRESSION: 1. Endotracheal tube in the mid trachea, 3 cm from the carina. 2. Transesophageal tube side port is positioned at the GE junction and should be advanced 2-3 cm for optimal function. 3. Unchanged interstitial opacities throughout the lungs, which could reflect interstitial edema. 4. More dense opacity in the left lung base could reflect atelectasis, pneumonia or layering effusion. Electronically Signed   By: Lovena Le M.D.   On: 08/06/2019 05:49   Dg Chest Port 1 View  Result Date: 08/05/2019 CLINICAL DATA:  Intubation EXAM: PORTABLE CHEST 1 VIEW COMPARISON:  08/03/2019, 07/27/2019, 07/21/2019 FINDINGS: Interval intubation, tip of the endotracheal tube is about 3.1 cm superior to the carina. Right-sided central venous catheter tips over the SVC and proximal right atrium. Cardiomegaly with vascular congestion and diffuse interstitial and ground-glass opacity consistent with edema. Small left effusion likely layering. Dense left lung base atelectasis or pneumonia. IMPRESSION: 1. Endotracheal tube tip about 3.1 cm superior to carina 2. Slightly improved aeration on the right. 3.  Cardiomegaly with vascular congestion and pulmonary edema. Asymmetric opacity left thorax likely due to layering pleural effusion. Dense left lung base atelectasis or pneumonia Electronically Signed   By: Donavan Foil M.D.   On: 08/05/2019 04:02   Dg Abd Portable 1v  Result Date: 08/05/2019 CLINICAL DATA:  Nasal/orogastric tube placement. EXAM: PORTABLE ABDOMEN - 1 VIEW COMPARISON:  CT, 07/20/2019. FINDINGS: Nasal/orogastric tube passes below the diaphragm. Tip lies in the proximal stomach. Side hole lies in the expected location of the GE junction. Normal bowel gas pattern. Vascular stents in the upper abdomen centrally. IMPRESSION: 1. Nasal/orogastric  tube tip in the proximal stomach. Side hole projects at the GE junction. 2. Normal bowel gas pattern. No evidence of a generalized adynamic ileus. No evidence of a bowel obstruction. Electronically Signed   By: Lajean Manes M.D.   On: 08/05/2019 13:59    Scheduled Meds: . aspirin  81 mg Per Tube Daily  . chlorhexidine gluconate (MEDLINE KIT)  15 mL Mouth Rinse BID  . Chlorhexidine Gluconate Cloth  6 each Topical Q0600  . hydrocortisone sod succinate (SOLU-CORTEF) inj  100 mg Intravenous Q12H  . insulin aspart  0-9 Units Subcutaneous Q4H  . ipratropium-albuterol  3 mL Nebulization Q6H  . mouth rinse  15 mL Mouth Rinse 10 times per day  . pantoprazole (PROTONIX) IV  40 mg Intravenous QHS  . sodium chloride flush  3 mL Intravenous Q12H   Continuous Infusions: . sodium chloride    . sodium chloride    . sodium chloride    . sodium chloride    . ceFEPime (MAXIPIME) IV    . famotidine (PEPCID) IV Stopped (08/05/19 1903)  . fentaNYL infusion INTRAVENOUS 75 mcg/hr (08/06/19 1143)  . heparin 750 Units/hr (08/06/19 1143)  . levETIRAcetam Stopped (08/06/19 0751)  . norepinephrine (LEVOPHED) Adult infusion Stopped (08/05/19 1043)  . propofol (DIPRIVAN) infusion 40 mcg/kg/min (08/06/19 1143)    Assessment/Plan:  1. PEA arrest.  Patient had  return of spontaneous circulation.  Targeted temperature management as per critical care specialist.  On heparin drip.  Patient is a DNR.  Overall prognosis is poor. 2. Acute hypoxic and hypercapnic respiratory failure.  Patient now intubated.  Vent management as per critical care specialist.  On cefepime. 3. Acute on chronic systolic congestive heart failure with EF less than 20. 4. End-stage renal disease on hemodialysis.  Dialysis for today. 5. Possible anoxic encephalopathy with posturing.  Started on [place] for potential seizure.  Overall prognosis poor. 6. History of hypertension 7. History of PAD  Code Status:     Code Status Orders  (From admission, onward)         Start     Ordered   08/05/19 0653  Do not attempt resuscitation (DNR)  Continuous    Question Answer Comment  In the event of cardiac or respiratory ARREST Do not call a "code blue"   In the event of cardiac or respiratory ARREST Do not perform Intubation, CPR, defibrillation or ACLS   In the event of cardiac or respiratory ARREST Use medication by any route, position, wound care, and other measures to relive pain and suffering. May use oxygen, suction and manual treatment of airway obstruction as needed for comfort.      08/05/19 5916        Code Status History    Date Active Date Inactive Code Status Order ID Comments User Context   08/05/2019 0613 08/05/2019 0652 Full Code [id number]  [name], NP Inpatient   08/04/2019 0127 08/05/2019 0613 Full Code [id number]  Mansy, Arvella Merles, MD ED   07/27/2019 1110 07/29/2019 1719 Full Code [id number]  Hillary Bow, MD ED   07/20/2019 2041 07/23/2019 1929 DNR [id number]  Vaughan Basta, MD Inpatient   07/09/2019 0543 07/12/2019 1502 Full Code [id number]  Harrie Foreman, MD Inpatient   07/02/2019 2128 07/06/2019 1557 Full Code [id number]  Henreitta Leber, MD Inpatient   05/19/2019 0603 05/22/2019 1957 Full Code [id number]  Lance Coon, MD Inpatient   02/28/2019 1805  03/03/2019 2234 Full Code [id number]  Vaughan Basta, MD Inpatient  02/21/2019 0835 02/24/2019 1945 Full Code [id number]  Harrie Foreman, MD Inpatient   02/04/2019 1343 02/09/2019 2050 Full Code [id number]  Saundra Shelling, MD ED   01/27/2019 0654 01/29/2019 2049 Partial Code [id number]  Harrie Foreman, MD Inpatient   01/07/2019 2156 01/27/2019 0638 Partial Code [id number]  Tennis Ship, MD Inpatient   12/25/2018 0158 01/07/2019 2027 Partial Code [id number]  Ina Homes, MD ED   Advance Care Planning Activity     Family Communication: As per critical care specialist Disposition Plan: To be determined  Consultants:  Critical care specialist  Cardiology  Nephrology  Antibiotics:  Cefepime  Time spent: 25 minutes.  Overall prognosis is poor.  Nikaya Nasby [employer]

## 2019-08-06 NOTE — Progress Notes (Addendum)
Pre HD:  Pt received in ICU 16 non-responsive on ventilator, BP's soft, currently on continuous heparin drip, propofol, and fentanyl. Tx initiated without complication. Pump speed lowered d/t to noted cardiac arrest on 08/05/19 and soft BP's as precaution.    08/06/19 1230  Vital Signs  Temp 98.2 F (36.8 C)  Temp Source Rectal  Pulse Rate 72  Pulse Rate Source Monitor  Resp 20  BP 98/62  BP Location Left Arm  BP Method Automatic  Patient Position (if appropriate) Lying  Oxygen Therapy  SpO2 100 %  O2 Device Ventilator  End Tidal CO2 (EtCO2) 21  Critical Care Pain Observation Tool (CPOT)  Facial Expression 0  Body Movements 0  Muscle Tension 0  Compliance with ventilator (intubated pts.) N/A  Vocalization (extubated pts.) N/A  CPOT Total 0  Dialysis Weight  Type of Weight Pre-Dialysis  Time-Out for Hemodialysis  What Procedure? HD   Pt Identifiers(min of two) First/Last Name;MRN/Account#;Pt's DOB(use if MRN/Acct# not available  Correct Site? Yes  Correct Side? Yes  Correct Procedure? Yes  Consents Verified? Yes  Rad Studies Available? N/A  Safety Precautions Reviewed? Yes  Engineer, civil (consulting) Number 7  Station Number  (ICU 16)  UF/Alarm Test Passed  Conductivity: Meter 14  Conductivity: Machine  14  pH 7.2  Reverse Osmosis WRO#4  Normal Saline Lot Number [id number]  Dialyzer Lot Number [id number]  Disposable Set Lot Number [id number]  Machine Temperature 98.6 F (37 C)  Musician and Audible Yes  Blood Lines Intact and Secured Yes  Pre Treatment Patient Checks  Vascular access used during treatment Catheter  HD catheter dressing before treatment WDL  Patient is receiving dialysis in a chair  (no)  Hepatitis B Surface Antigen Results Negative  Date Hepatitis B Surface Antigen Drawn 08/03/19  Isolation Initiated  (BS)  Hepatitis B Surface Antibody  (<10)  Date Hepatitis B Surface Antibody Drawn 07/21/19  Hemodialysis Consent Verified Yes   Hemodialysis Standing Orders Initiated Yes  ECG (Telemetry) Monitor On Yes  Prime Ordered Normal Saline  Length of  DialysisTreatment -hour(s) 3 Hour(s)  Dialysis Treatment Comments  (Na 140)  Dialyzer Elisio 17H NR  Dialysate 2K;2.5 Ca  Dialysate Flow Ordered 600  Blood Flow Rate Ordered 300 mL/min  Ultrafiltration Goal 1.5 Liters  Dialysis Blood Pressure Support Ordered Normal Saline  Education / Care Plan  Dialysis Education Provided No (Comment) (pt on ventilator)  Hemodialysis Catheter Right Internal jugular Double lumen Temporary (Non-Tunneled)  No Placement Date or Time found.   Placed prior to admission: Yes  Orientation: Right  Access Location: Internal jugular  Hemodialysis Catheter Type: Double lumen Temporary (Non-Tunneled)  Site Condition No complications  Blue Lumen Status Blood return noted  Red Lumen Status Blood return noted  Purple Lumen Status N/A  Dressing Type Biopatch;Occlusive  Dressing Status Intact  Drainage Description None  Dressing Change Due 08/10/19

## 2019-08-06 NOTE — Progress Notes (Signed)
HD Initiated:    08/06/19 1250  Vital Signs  Temp 98.2 F (36.8 C)  Temp Source Rectal  Pulse Rate 68  Resp 20  BP 98/62  BP Location Left Arm  BP Method Automatic  Patient Position (if appropriate) Lying  Oxygen Therapy  SpO2 100 %  O2 Device Ventilator  End Tidal CO2 (EtCO2) 21  Critical Care Pain Observation Tool (CPOT)  Facial Expression 0  Body Movements 0  Muscle Tension 0  Compliance with ventilator (intubated pts.) N/A  Vocalization (extubated pts.) N/A  CPOT Total 0  Dialysis Weight  Weight 53.1 kg  Type of Weight Pre-Dialysis  During Hemodialysis Assessment  Blood Flow Rate (mL/min) 300 mL/min  Arterial Pressure (mmHg) -120 mmHg  Venous Pressure (mmHg) 80 mmHg  Transmembrane Pressure (mmHg) 70 mmHg  Ultrafiltration Rate (mL/min) 670 mL/min  Dialysate Flow Rate (mL/min) 600 ml/min  Conductivity: Machine  13.6  HD Safety Checks Performed Yes  Dialysis Fluid Bolus Normal Saline  Bolus Amount (mL) 250 mL  Intra-Hemodialysis Comments Tx initiated

## 2019-08-06 NOTE — Progress Notes (Signed)
Pre HD Assessment:    08/06/19 1230  Neurological  Level of Consciousness Unresponsive  Orientation Level Intubated/Tracheostomy - Unable to assess  Respiratory  Respiratory Pattern Regular;Unlabored  Chest Assessment Chest expansion symmetrical  Bilateral Breath Sounds Clear;Diminished  Airway 7.5 mm  Placement Date/Time: 08/05/19 0300   Placed By: (c) Other (Comment)  Airway Device: Endotracheal Tube  ETT Types: Oral  Size (mm): 7.5 mm  Cuffed: Cuffed  Insertion attempts: 1  Airway Equipment: Video Laryngoscope  Placement Confirmation: Direct Visu...  Measured From Lips  Secured Location Right  Secured By Charity fundraiser  Site Condition Dry  Cardiac  Pulse Regular  Heart Sounds S1, S2  Jugular Venous Distention (JVD) No  Cardiac Rhythm NSR  Antiarrhythmic device No  Vascular  R Radial Pulse +1  L Radial Pulse +1  NG/OG Tube Orogastric 16 Fr. Center mouth Xray Documented cm marking at nare/ corner of mouth 50 cm  Placement Date/Time: 08/05/19 0400   Person Inserting Catheter: Myna Hidalgo, RN  Tube Type: Orogastric  Tube Size (Fr.): 16 Fr.  Tube Location: Center mouth  Initial Placement Verification: (c) Xray  Technique Used to Measure Tube Placement: Doc...  Site Assessment Clean;Dry;Intact  Amount of suction 80 mmHg  Psychosocial  Psychosocial (WDL) X  Patient Behaviors Not interactive     08/06/19 1230  Neurological  Level of Consciousness Unresponsive  Orientation Level Intubated/Tracheostomy - Unable to assess  Respiratory  Respiratory Pattern Regular;Unlabored  Chest Assessment Chest expansion symmetrical  Bilateral Breath Sounds Clear;Diminished  Airway 7.5 mm  Placement Date/Time: 08/05/19 0300   Placed By: (c) Other (Comment)  Airway Device: Endotracheal Tube  ETT Types: Oral  Size (mm): 7.5 mm  Cuffed: Cuffed  Insertion attempts: 1  Airway Equipment: Video Laryngoscope  Placement Confirmation: Direct Visu...  Measured From Lips  Secured Location  Right  Secured By Charity fundraiser  Site Condition Dry  Cardiac  Pulse Regular  Heart Sounds S1, S2  Jugular Venous Distention (JVD) No  Cardiac Rhythm NSR  Antiarrhythmic device No  Vascular  R Radial Pulse +1  L Radial Pulse +1  NG/OG Tube Orogastric 16 Fr. Center mouth Xray Documented cm marking at nare/ corner of mouth 50 cm  Placement Date/Time: 08/05/19 0400   Person Inserting Catheter: Myna Hidalgo, RN  Tube Type: Orogastric  Tube Size (Fr.): 16 Fr.  Tube Location: Center mouth  Initial Placement Verification: (c) Xray  Technique Used to Measure Tube Placement: Doc...  Site Assessment Clean;Dry;Intact  Amount of suction 80 mmHg  Psychosocial  Psychosocial (WDL) X  Patient Behaviors Not interactive

## 2019-08-06 NOTE — Progress Notes (Signed)
Post HD Assessment:    08/06/19 1601  Neurological  Level of Consciousness Unresponsive  Orientation Level Intubated/Tracheostomy - Unable to assess  Respiratory  Respiratory Pattern Regular;Unlabored  Airway 7.5 mm  Placement Date/Time: 08/05/19 0300   Placed By: (c) Other (Comment)  Airway Device: Endotracheal Tube  ETT Types: Oral  Size (mm): 7.5 mm  Cuffed: Cuffed  Insertion attempts: 1  Airway Equipment: Video Laryngoscope  Placement Confirmation: Direct Visu...  Measured From Lips  Secured Location Left  Secured By Charity fundraiser  Site Condition Dry  Cardiac  Pulse Regular  Heart Sounds S1, S2  Jugular Venous Distention (JVD) No  Cardiac Rhythm NSR  Antiarrhythmic device No  Vascular  R Radial Pulse +1  L Radial Pulse +1  NG/OG Tube Orogastric 16 Fr. Center mouth Xray Documented cm marking at nare/ corner of mouth 50 cm  Placement Date/Time: 08/05/19 0400   Person Inserting Catheter: Myna Hidalgo, RN  Tube Type: Orogastric  Tube Size (Fr.): 16 Fr.  Tube Location: Center mouth  Initial Placement Verification: (c) Xray  Technique Used to Measure Tube Placement: Doc...  Site Assessment Clean;Dry;Intact  Amount of suction 80 mmHg  Psychosocial  Psychosocial (WDL) X  Patient Behaviors Not interactive

## 2019-08-06 NOTE — Progress Notes (Signed)
Name: Brett Small MRN: [id number] DOB: [birth year]     CONSULTATION DATE: 08/05/2019  CHIEF COMPLAINT:  Cardiac arrest  STUDIES/SIGNIFICANT EVENTS: 10/13 - admitted as telemetry patient 10/14 - cardiac arrest 10/14 - CT Head negative 10/14 - targeted temperature management initiated 10/14 - cardiology and neurology consulted; patient made DNR 10/15-purposeful movements noted on exam today when weaning sedation   HISTORY OF PRESENT ILLNESS:   Brett Small is a 43 yo male with a past medical history of ESRD on hemodialysis, hypertension, peripheral vascular disease, and CHF with EF <20% who presented to the ED complaining of shortness of breath, dry cough, and wheezing. He was placed on BiPAP and admitted to the hospital for dialysis for fluid overload and flash pulmonary edema. Pt was then weaned off BiPAP.  On 10/14, pt became suddenly altered and then unresponsive with agonal respirations, leading to respiratory arrest with bradycardia and eventual PEA arrest requiring approximately 10 minutes of CPR/ACLS before ROSC was achieved. Pt was intubated during arrest. Post arrest, pt was still unresponsive with decorticate posturing to pain stimuli. He was placed on targeted temperature management to 36 C and placed on a heparin drip because of possibility of PE given significantly elevated D dimer.   PAST MEDICAL HISTORY :   has a past medical history of ESRD (end stage renal disease) ([place]), Hypertension, PAD (peripheral artery disease) ([place]), Peripheral vascular disease ([place]), Renal disorder, and Secondary hyperparathyroidism of renal origin ([place]).  has a past surgical history that includes Aorta - femoral artery bypass graft; AV fistula placement (Left, 12/26/2018); A/V SHUNT INTERVENTION (Left, 01/19/2019); DIALYSIS/PERMA CATHETER REMOVAL (N/A, 02/06/2019); A/V SHUNT INTERVENTION (Left, 03/02/2019); LEFT HEART CATH AND CORONARY ANGIOGRAPHY (Right, 07/10/2019); and PERIPHERAL VASCULAR  THROMBECTOMY (Left, 07/28/2019). Prior to Admission medications   Medication Sig Start Date End Date Taking? Authorizing Provider  amLODipine (NORVASC) 10 MG tablet Take 1 tablet (10 mg total) by mouth daily at 8 pm. 02/24/19  Yes Gladstone Lighter, MD  apixaban (ELIQUIS) 2.5 MG TABS tablet Take 1 tablet (2.5 mg total) by mouth 2 (two) times daily. 01/26/19  Yes Clapacs, Madie Reno, MD  aspirin EC 81 MG tablet Take 1 tablet (81 mg total) by mouth daily. 01/26/19  Yes Clapacs, Madie Reno, MD  atorvastatin (LIPITOR) 80 MG tablet Take 1 tablet (80 mg total) by mouth at bedtime. 01/26/19  Yes Clapacs, Madie Reno, MD  b complex-C-folic acid 1 MG capsule Take 1 capsule by mouth daily after supper.    Yes [provider]  carvedilol (COREG) 25 MG tablet Take 1 tablet (25 mg total) by mouth 2 (two) times daily with a meal. 01/26/19  Yes Clapacs, Madie Reno, MD  gabapentin (NEURONTIN) 100 MG capsule Take 1 capsule (100 mg total) by mouth 3 (three) times daily. 01/26/19  Yes Clapacs, Madie Reno, MD  hydrALAZINE (APRESOLINE) 25 MG tablet Take 1 tablet (25 mg total) by mouth every 8 (eight) hours. 01/26/19  Yes Clapacs, Madie Reno, MD  hydrOXYzine (ATARAX/VISTARIL) 25 MG tablet Take 25 mg by mouth 3 (three) times daily as needed for anxiety.   Yes [provider]  loratadine (CLARITIN) 10 MG tablet Take 1 tablet (10 mg total) by mouth daily. 03/04/19  Yes Salary, Avel Peace, MD  multivitamin (RENA-VIT) TABS tablet Take 1 tablet by mouth daily. 01/27/19  Yes Clapacs, Madie Reno, MD  neomycin-bacitracin-polymyxin (NEOSPORIN) OINT Apply 1 application topically 2 (two) times daily. 01/26/19  Yes Clapacs, Madie Reno, MD  Nutritional Supplements (FEEDING SUPPLEMENT, NEPRO CARB  STEADY,) LIQD Take 237 mLs by mouth 2 (two) times daily between meals. 07/06/19  Yes Epifanio Lesches, MD  sevelamer carbonate (RENVELA) 800 MG tablet Take 2 tablets (1,600 mg total) by mouth 3 (three) times daily with meals. 01/26/19  Yes Clapacs, Madie Reno, MD   Allergies   Allergen Reactions  . Codeine Nausea Only    Patient questioned this (??)  . Sulfa Antibiotics Hives and Nausea And Vomiting    REVIEW OF SYSTEMS:   Unable to obtain due to critical illness   VITAL SIGNS: Temp:  [96.1 F (35.6 C)-98.2 F (36.8 C)] 98.2 F (36.8 C) (10/15 1200) Pulse Rate:  [44-94] 70 (10/15 1200) Resp:  [10-24] 20 (10/15 1200) BP: (87-121)/(56-90) 96/56 (10/15 1200) SpO2:  [97 %-100 %] 100 % (10/15 1200) FiO2 (%):  [30 %-60 %] 30 % (10/15 1200) Weight:  [49.5 kg] 49.5 kg (10/15 0147)   I/O last 3 completed shifts: In: 1205.5 [I.V.:668.8; IV Piggyback:536.8] Out: 353 [Urine:53; Emesis/NG output:300] Total I/O In: 310.2 [I.V.:210.2; IV Piggyback:100] Out: 45 [Urine:45]   SpO2: 100 % O2 Flow Rate (L/min): 3 L/min FiO2 (%): 30 %  Vent Mode: PRVC FiO2 (%):  [30 %-60 %] 30 % Set Rate:  [20 bmp] 20 bmp Vt Set:  [50 mL-500 mL] 500 mL PEEP:  [5 cmH20] 5 cmH20   Physical Examination:  GENERAL: Chronically ill appearing, intubated and sedated HEAD: Normocephalic, atraumatic.  EYES: Fixed pupils of grossly different sizes. No scleral icterus.  MOUTH: Moist mucosal membrane. NECK: Supple. No JVD.  PULMONARY: lungs clear to auscultation CARDIOVASCULAR: S1 and S2. Regular rate and rhythm. No murmurs, rubs, or gallops.  GASTROINTESTINAL: Soft, nontender, -distended. No masses. Positive bowel sounds. No hepatosplenomegaly.  MUSCULOSKELETAL: No swelling, clubbing, or edema.  NEUROLOGIC: Sedated on mechanical ventilation SKIN:intact,warm,dry  I personally reviewed lab work that was obtained in last 24 hrs. CXR Independently reviewed  MEDICATIONS: I have reviewed all medications and confirmed regimen as documented   CULTURE RESULTS   Recent Results (from the past 240 hour(s))  SARS Coronavirus 2 by RT PCR (hospital order, performed in [facility] hospital lab) Nasopharyngeal Nasopharyngeal Swab     Status: None   Collection Time: 08/03/19  9:25 AM    Specimen: Nasopharyngeal Swab  Result Value Ref Range Status   SARS Coronavirus 2 NEGATIVE NEGATIVE Final    Comment: (NOTE) If result is NEGATIVE SARS-CoV-2 target nucleic acids are NOT DETECTED. The SARS-CoV-2 RNA is generally detectable in upper and lower  respiratory specimens during the acute phase of infection. The lowest  concentration of SARS-CoV-2 viral copies this assay can detect is 250  copies / mL. A negative result does not preclude SARS-CoV-2 infection  and should not be used as the sole basis for treatment or other  patient management decisions.  A negative result may occur with  improper specimen collection / handling, submission of specimen other  than nasopharyngeal swab, presence of viral mutation(s) within the  areas targeted by this assay, and inadequate number of viral copies  (<250 copies / mL). A negative result must be combined with clinical  observations, patient history, and epidemiological information. If result is POSITIVE SARS-CoV-2 target nucleic acids are DETECTED. The SARS-CoV-2 RNA is generally detectable in upper and lower  respiratory specimens dur ing the acute phase of infection.  Positive  results are indicative of active infection with SARS-CoV-2.  Clinical  correlation with patient history and other diagnostic information is  necessary to determine patient infection status.  Positive results do  not rule out bacterial infection or co-infection with other viruses. If result is PRESUMPTIVE POSTIVE SARS-CoV-2 nucleic acids MAY BE PRESENT.   A presumptive positive result was obtained on the submitted specimen  and confirmed on repeat testing.  While 2019 novel coronavirus  (SARS-CoV-2) nucleic acids may be present in the submitted sample  additional confirmatory testing may be necessary for epidemiological  and / or clinical management purposes  to differentiate between  SARS-CoV-2 and other Sarbecovirus currently known to infect humans.  If  clinically indicated additional testing with an alternate test  methodology [phone]) is advised. The SARS-CoV-2 RNA is generally  detectable in upper and lower respiratory sp ecimens during the acute  phase of infection. The expected result is Negative. Fact Sheet for Patients:  StrictlyIdeas.no Fact Sheet for Healthcare Providers: BankingDealers.co.za This test is not yet approved or cleared by the [place] FDA and has been authorized for detection and/or diagnosis of SARS-CoV-2 by FDA under an Emergency Use Authorization (EUA).  This EUA will remain in effect (meaning this test can be used) for the duration of the COVID-19 declaration under Section 564(b)(1) of the Act, 21 U.S.C. section 360bbb-3(b)(1), unless the authorization is terminated or revoked sooner. Performed at [facility], [address] [place], [place] [address]   Blood culture (routine x 2)     Status: None (Preliminary result)   Collection Time: 08/03/19 10:05 AM   Specimen: BLOOD  Result Value Ref Range Status   Specimen Description BLOOD BLOOD RIGHT FOREARM  Final   Special Requests   Final    BOTTLES DRAWN AEROBIC AND ANAEROBIC Blood Culture adequate volume   Culture   Final    NO GROWTH 3 DAYS Performed at [facility], [address] [place], [place] [address]    Report Status PENDING  Incomplete  MRSA PCR Screening     Status: None   Collection Time: 08/04/19  7:56 AM   Specimen: Nasopharyngeal  Result Value Ref Range Status   MRSA by PCR NEGATIVE NEGATIVE Final    Comment:        The GeneXpert MRSA Assay (FDA approved for NASAL specimens only), is one component of a comprehensive MRSA colonization surveillance program. It is not intended to diagnose MRSA infection nor to guide or monitor treatment for MRSA infections. Performed at [facility], [address] [place], [place] [address]   Urine Culture      Status: None   Collection Time: 08/05/19 10:14 AM   Specimen: Urine, Random  Result Value Ref Range Status   Specimen Description   Final    URINE, RANDOM Performed at [facility], [address], [place], [place] [address]    Special Requests   Final    NONE Performed at [facility], [address] [place], [place] [address]    Culture   Final    NO GROWTH Performed at [facility], [place] [address] [place], [place] [address]    Report Status 08/06/2019 FINAL  Final  Culture, respiratory (non-expectorated)     Status: None (Preliminary result)   Collection Time: 08/05/19  8:40 PM   Specimen: Tracheal Aspirate; Respiratory  Result Value Ref Range Status   Specimen Description   Final    TRACHEAL ASPIRATE Performed at [facility], [address], [place], [place] [address]    Special Requests   Final    Normal Performed at [facility], [address] [place], [place] [address]    Gram Stain   Final    FEW WBC PRESENT,BOTH PMN AND MONONUCLEAR NO ORGANISMS SEEN Performed at [facility], [place] [address], [place], [place] [address]    Culture PENDING  Incomplete   Report Status PENDING  Incomplete          IMAGING    Dg Abd 1 View  Result Date: 08/06/2019 CLINICAL DATA:  Abdominal distension. EXAM: ABDOMEN - 1 VIEW COMPARISON:  None. FINDINGS: The bowel gas pattern is normal. No radio-opaque calculi or other significant radiographic abnormality are seen. Vascular stent noted. Surgical clips and rectal temperature probe are also seen. Defibrillator pad is in place. IMPRESSION: No acute finding. Electronically Signed   By: Inge Rise M.D.   On: 08/06/2019 11:16   US Venous Img Lower Bilateral  Result Date: 08/05/2019 CLINICAL DATA:  Bilateral lower extremity pain and edema. EXAM: BILATERAL LOWER EXTREMITY VENOUS DOPPLER ULTRASOUND TECHNIQUE: Gray-scale sonography with graded compression, as well as color  Doppler and duplex ultrasound were performed to evaluate the lower extremity deep venous systems from the level of the common femoral vein and including the common femoral, femoral, profunda femoral, popliteal and calf veins including the posterior tibial, peroneal and gastrocnemius veins when visible. The superficial great saphenous vein was also interrogated. Spectral Doppler was utilized to evaluate flow at rest and with distal augmentation maneuvers in the common femoral, femoral and popliteal veins. COMPARISON:  None. FINDINGS: RIGHT LOWER EXTREMITY Common Femoral Vein: No evidence of thrombus. Normal compressibility, respiratory phasicity and response to augmentation. Saphenofemoral Junction: No evidence of thrombus. Normal compressibility and flow on color Doppler imaging. Profunda Femoral Vein: No evidence of thrombus. Normal compressibility and flow on color Doppler imaging. Femoral Vein: No evidence of thrombus. Normal compressibility, respiratory phasicity and response to augmentation. Popliteal Vein: No evidence of thrombus. Normal compressibility, respiratory phasicity and response to augmentation. Calf Veins: No evidence of thrombus. Normal compressibility and flow on color Doppler imaging. Superficial Great Saphenous Vein: No evidence of thrombus. Normal compressibility. Venous Reflux:  None. Other Findings: No evidence of superficial thrombophlebitis or abnormal fluid collection. LEFT LOWER EXTREMITY Common Femoral Vein: No evidence of thrombus. Normal compressibility, respiratory phasicity and response to augmentation. Saphenofemoral Junction: No evidence of thrombus. Normal compressibility and flow on color Doppler imaging. Profunda Femoral Vein: No evidence of thrombus. Normal compressibility and flow on color Doppler imaging. Femoral Vein: No evidence of thrombus. Normal compressibility, respiratory phasicity and response to augmentation. Popliteal Vein: No evidence of thrombus. Normal  compressibility, respiratory phasicity and response to augmentation. Calf Veins: No evidence of thrombus. Normal compressibility and flow on color Doppler imaging. Superficial Great Saphenous Vein: No evidence of thrombus. Normal compressibility. Venous Reflux:  None. Other Findings: No evidence of superficial thrombophlebitis or abnormal fluid collection. IMPRESSION: No evidence of deep venous thrombosis in either lower extremity. Electronically Signed   By: Aletta Edouard M.D.   On: 08/05/2019 14:00   Dg Chest Port 1 View  Result Date: 08/06/2019 CLINICAL DATA:  Acute respiratory failure EXAM: PORTABLE CHEST 1 VIEW COMPARISON:  Radiograph 08/05/2019 FINDINGS: Endotracheal tube in the mid trachea, 3 cm from the carina. Transesophageal tube side port is positioned at the GE junction and should be advanced 2-3 cm for optimal function. Dual lumen right IJ approach dialysis catheter tips terminate in the lower SVC. Pacer pads overlie the chest. Cardiac monitoring leads are present as well. Left axillary vascular stent is noted. Diffuse interstitial opacities again  seen throughout the lungs. More dense opacities present in the left lung base. Small left effusion remains. No pneumothorax. IMPRESSION: 1. Endotracheal tube in the mid trachea, 3 cm from the carina. 2. Transesophageal tube side port is positioned at the GE junction and should be advanced 2-3 cm for optimal function. 3. Unchanged interstitial opacities throughout the lungs, which could reflect interstitial edema. 4. More dense opacity in the left lung base could reflect atelectasis, pneumonia or layering effusion. Electronically Signed   By: Lovena Le M.D.   On: 08/06/2019 05:49        Indwelling Urinary Catheter continued, requirement due to   Reason to continue Indwelling Urinary Catheter strict Intake/Output monitoring for hemodynamic instability   Central Line/ continued, requirement due to  Reason to continue [place] of  central venous pressure or other hemodynamic parameters and poor IV access   Ventilator continued, requirement due to severe respiratory failure   Ventilator Sedation RASS 0 to -2     ASSESSMENT AND PLAN  43 yo male with ESRD on HD and CHF with EF <20% admitted to the ICU with acute hypoxic respiratory distress secondary to flash pulmonary edema from CHF exacerbation. Pt had respiratory and eventual cardiac arrest in ICU with PEA that required ACLS resuscitation secondary to differential of PE vs CHF with volume overload vs pneumonia.  Severe ACUTE Hypoxic and Hypercapnic Respiratory Failure -continue Full MV support -continue Bronchodilator Therapy -Wean Fio2 and PEEP as tolerated -will perform SAT/SBT when respiratory parameters are met -possible acute venous pulmonary thromboembolism  Cardiac arrest, with cardiogenic shock ROSC achieved; pt now DNR - TTM at [facility]- cooling is over - will start rewarming and waking up  - maintain MAP >65 using vasopressors - Repeat echo pending - follow up cardiology recs  ACUTE on [place]- EF <20% -oxygen as needed -Lasix as tolerated -follow up cardiac enzymes as indicated -follow up cardiology recs  End stage renal disease on HD -follow BMP -follow UO -continue Foley Catheter-assess need -Avoid nephrotoxic agents -Nephrology consulted, follow up on recs  NEUROLOGY - intubated and sedated -minimize sedation to optimize for SBT and better neuro eval  - neurology on case - Dr Irish Elders - appreciate input Wake up assessment - localizes off sedation  CARDIAC ICU monitoring  ID -continue IV abx as prescibed -follow up cultures  GI GI PROPHYLAXIS as indicated  NUTRITIONAL STATUS DIET-->TF's as tolerated Constipation protocol as indicated  ENDO - will use ICU hypoglycemic\Hyperglycemia protocol if needed  ELECTROLYTES -follow labs as needed -replace as needed -pharmacy consultation and  following  DVT/GI PRX ordered TRANSFUSIONS AS NEEDED MONITOR FSBS ASSESS the need for LABS      Ottie Glazier, M.D.  Pulmonary [facility] [place]    Critical care provider statement:    Critical care time (minutes):  33   Critical care time was exclusive of:  Separately billable procedures and  treating other patients   Critical care was necessary to treat or prevent imminent or  life-threatening deterioration of the following conditions:   Cardiac arrest, acute hypoxemic respiratory failure, multiple comorbid conditions   Critical care was time spent personally by me on the following  activities:  Development of treatment plan with patient or surrogate,  discussions with consultants, evaluation of patient's response to  treatment, examination of patient, obtaining history from patient or  surrogate, ordering and performing treatments and interventions, ordering  and review of laboratory studies and  re-evaluation of patient's condition   I assumed direction of critical care for this patient from another  provider in my specialty: no

## 2019-08-06 NOTE — Progress Notes (Signed)
[facility]  ROUNDING NOTE   Subjective:  Patient seen and evaluated at bedside. Remains critically ill. Still on the ventilator. Currently on sedation.   Objective:  Vital signs in last 24 hours:  Temp:  [96.1 F (35.6 C)-98.2 F (36.8 C)] 98.2 F (36.8 C) (10/15 1250) Pulse Rate:  [44-94] 69 (10/15 1315) Resp:  [10-24] 20 (10/15 1315) BP: (87-121)/(56-90) 99/59 (10/15 1315) SpO2:  [97 %-100 %] 100 % (10/15 1315) FiO2 (%):  [30 %-60 %] 30 % (10/15 1200) Weight:  [49.5 kg-53.1 kg] 53.1 kg (10/15 1250)  Weight change: 6.1 kg Filed Weights   08/06/19 0147 08/06/19 1230 08/06/19 1250  Weight: 49.5 kg 53.1 kg 53.1 kg    Intake/Output: I/O last 3 completed shifts: In: 1205.5 [I.V.:668.8; IV Piggyback:536.8] Out: 353 [Urine:53; Emesis/NG output:300]   Intake/Output this shift:  Total I/O In: 310.2 [I.V.:210.2; IV Piggyback:100] Out: 45 [Urine:45]  Physical Exam: General: Critically ill-appearing  Head: ETT in place  Eyes: Anicteric  Neck: Supple, trachea midline  Lungs:  Clear to auscultation, vent assisted  Heart: S1S2 no rubs  Abdomen:  Soft, nontender, bowel sounds present  Extremities: No peripheral edema.  Neurologic: Intubated, not following commands  Skin: No lesions  Access: LUE AVF, Right IJ PC    Basic Metabolic Panel: Recent Labs  Lab 08/04/19 0845 08/05/19 0431 08/05/19 0906 08/05/19 1819 08/06/19 0422 08/06/19 1124  NA 139 140 137 137 138  --   K 3.4* 5.0 5.3* 4.7 4.8  --   CL 99 103 100 100 103  --   CO2 _0 19*  --   GLUCOSE 82 164* 129* 98 112*  --   BUN 18 21* 23* 30* 36*  --   CREATININE 2.86* 2.63* 2.87* 3.44* 3.65*  --   CALCIUM 8.3* 9.5 9.1 9.1 8.7*  --   PHOS 3.9  --   --   --   --  9.1*    Liver Function Tests: Recent Labs  Lab 08/03/19 0925 08/04/19 0845 08/05/19 0431 08/06/19 0422  AST 28  --  117* 79*  ALT 11  --  67* 66*  ALKPHOS 73  --  94 75  BILITOT 1.5*  --  1.2 1.7*  PROT 6.4*  --  6.2*  5.8*  ALBUMIN 2.9* 2.7* 2.6* 2.4*   No results for input(s): LIPASE, AMYLASE in the last 168 hours. No results for input(s): AMMONIA in the last 168 hours.  CBC: Recent Labs  Lab 08/03/19 0925  08/04/19 0349 08/05/19 0431 08/05/19 1132 08/05/19 1819 08/06/19 0422  WBC 17.2*   < > 10.1 12.3* 12.1* 8.0 6.9  NEUTROABS 14.1*  --  8.0* 10.7* 9.9* 7.1  --   HGB 10.4*   < > 9.2* 8.2* 8.2* 8.3* 8.0*  HCT 33.6*   < > 28.8* 26.8* 26.3* 26.3* 25.0*  MCV 104.7*   < > 100.0 104.3* 102.7* 102.3* 99.6  PLT 268   < > 150 135* 134* 122* 116*   < > = values in this interval not displayed.    Cardiac Enzymes: No results for input(s): CKTOTAL, CKMB, CKMBINDEX, TROPONINI in the last 168 hours.  BNP: Invalid input(s): POCBNP  CBG: Recent Labs  Lab 08/05/19 2021 08/05/19 2340 08/06/19 0315 08/06/19 0732 08/06/19 1141  GLUCAP 92 96 96 100* 109*    Microbiology: Results for orders placed or performed during the hospital encounter of 08/03/19  SARS Coronavirus 2 by RT PCR (hospital order, performed in  [facility] hospital lab) Nasopharyngeal Nasopharyngeal Swab     Status: None   Collection Time: 08/03/19  9:25 AM   Specimen: Nasopharyngeal Swab  Result Value Ref Range Status   SARS Coronavirus 2 NEGATIVE NEGATIVE Final    Comment: (NOTE) If result is NEGATIVE SARS-CoV-2 target nucleic acids are NOT DETECTED. The SARS-CoV-2 RNA is generally detectable in upper and lower  respiratory specimens during the acute phase of infection. The lowest  concentration of SARS-CoV-2 viral copies this assay can detect is 250  copies / mL. A negative result does not preclude SARS-CoV-2 infection  and should not be used as the sole basis for treatment or other  patient management decisions.  A negative result may occur with  improper specimen collection / handling, submission of specimen other  than nasopharyngeal swab, presence of viral mutation(s) within the  areas targeted by this assay, and  inadequate number of viral copies  (<250 copies / mL). A negative result must be combined with clinical  observations, patient history, and epidemiological information. If result is POSITIVE SARS-CoV-2 target nucleic acids are DETECTED. The SARS-CoV-2 RNA is generally detectable in upper and lower  respiratory specimens dur ing the acute phase of infection.  Positive  results are indicative of active infection with SARS-CoV-2.  Clinical  correlation with patient history and other diagnostic information is  necessary to determine patient infection status.  Positive results do  not rule out bacterial infection or co-infection with other viruses. If result is PRESUMPTIVE POSTIVE SARS-CoV-2 nucleic acids MAY BE PRESENT.   A presumptive positive result was obtained on the submitted specimen  and confirmed on repeat testing.  While 2019 novel coronavirus  (SARS-CoV-2) nucleic acids may be present in the submitted sample  additional confirmatory testing may be necessary for epidemiological  and / or clinical management purposes  to differentiate between  SARS-CoV-2 and other Sarbecovirus currently known to infect humans.  If clinically indicated additional testing with an alternate test  methodology [phone]) is advised. The SARS-CoV-2 RNA is generally  detectable in upper and lower respiratory sp ecimens during the acute  phase of infection. The expected result is Negative. Fact Sheet for Patients:  StrictlyIdeas.no Fact Sheet for Healthcare Providers: BankingDealers.co.za This test is not yet approved or cleared by the [place] FDA and has been authorized for detection and/or diagnosis of SARS-CoV-2 by FDA under an Emergency Use Authorization (EUA).  This EUA will remain in effect (meaning this test can be used) for the duration of the COVID-19 declaration under Section 564(b)(1) of the Act, 21 U.S.C. section 360bbb-3(b)(1), unless the  authorization is terminated or revoked sooner. Performed at [facility], [address] [place], [place] [address]   Blood culture (routine x 2)     Status: None (Preliminary result)   Collection Time: 08/03/19 10:05 AM   Specimen: BLOOD  Result Value Ref Range Status   Specimen Description BLOOD BLOOD RIGHT FOREARM  Final   Special Requests   Final    BOTTLES DRAWN AEROBIC AND ANAEROBIC Blood Culture adequate volume   Culture   Final    NO GROWTH 3 DAYS Performed at [facility], [address] [place],  [address]    Report Status PENDING  Incomplete  MRSA PCR Screening     Status: None   Collection Time: 08/04/19  7:56 AM   Specimen: Nasopharyngeal  Result Value Ref Range Status   MRSA by PCR NEGATIVE NEGATIVE Final    Comment:  The GeneXpert MRSA Assay (FDA approved for NASAL specimens only), is one component of a comprehensive MRSA colonization surveillance program. It is not intended to diagnose MRSA infection nor to guide or monitor treatment for MRSA infections. Performed at [facility], [address], [place], [place] [address]   Urine Culture     Status: None   Collection Time: 08/05/19 10:14 AM   Specimen: Urine, Random  Result Value Ref Range Status   Specimen Description   Final    URINE, RANDOM Performed at [facility], [address] [place], [place] [address]    Special Requests   Final    NONE Performed at [facility], [address], [place], [place] [address]    Culture   Final    NO GROWTH Performed at [facility], [place] [address] [place], [place] [address]    Report Status 08/06/2019 FINAL  Final  Culture, respiratory (non-expectorated)     Status: None (Preliminary result)   Collection Time: 08/05/19  8:40 PM   Specimen: Tracheal Aspirate; Respiratory  Result Value Ref Range Status   Specimen Description   Final    TRACHEAL ASPIRATE Performed at [facility], [address] [place], [place] [address]    Special Requests   Final    Normal Performed at [facility], [address] [place], [place] [address]    Gram Stain   Final    FEW WBC PRESENT,BOTH PMN AND MONONUCLEAR NO ORGANISMS SEEN Performed at [facility], [place] [address], [place], [place] [address]    Culture PENDING  Incomplete   Report Status PENDING  Incomplete    Coagulation Studies: Recent Labs    08/05/19 0431 08/05/19 0906  LABPROT 17.3* 18.2*  INR 1.4* 1.5*    Urinalysis: Recent Labs    08/05/19 2049  COLORURINE AMBER*  LABSPEC 1.023  PHURINE 6.0  GLUCOSEU NEGATIVE  HGBUR MODERATE*  BILIRUBINUR NEGATIVE  KETONESUR NEGATIVE  PROTEINUR 100*  NITRITE NEGATIVE  LEUKOCYTESUR SMALL*      Imaging: Dg Abd 1 View  Result Date: 08/06/2019 CLINICAL DATA:  Abdominal distension. EXAM: ABDOMEN - 1 VIEW COMPARISON:  None. FINDINGS: The bowel gas pattern is normal. No radio-opaque calculi or other significant radiographic abnormality are seen. Vascular stent noted. Surgical clips and rectal temperature probe are also seen. Defibrillator pad is in place. IMPRESSION: No acute finding. Electronically Signed   By: Inge Rise M.D.   On: 08/06/2019 11:16   Ct Head Wo Contrast  Result Date: 08/05/2019 CLINICAL DATA:  Cardiac arrest EXAM: CT HEAD WITHOUT CONTRAST TECHNIQUE: Contiguous axial images were obtained from the base of the skull through the vertex without intravenous contrast. COMPARISON:  None. FINDINGS: Brain: No evidence of acute infarction, hemorrhage, hydrocephalus, extra-axial collection or mass lesion/mass effect. Vascular: No hyperdense vessel or unexpected calcification. Skull: Normal. Negative for fracture or focal lesion. Sinuses/Orbits: No acute finding. IMPRESSION: Negative head CT.  No evidence of anoxic injury Electronically Signed   By: Monte Fantasia M.D.   On: 08/05/2019 06:02   US Venous Img Lower Bilateral  Result  Date: 08/05/2019 CLINICAL DATA:  Bilateral lower extremity pain and edema. EXAM: BILATERAL LOWER EXTREMITY VENOUS DOPPLER ULTRASOUND TECHNIQUE: Gray-scale sonography with graded compression, as well as color Doppler and duplex ultrasound were performed to evaluate the lower extremity deep venous systems from the level of the common femoral vein and including the common femoral, femoral, profunda femoral, popliteal and calf veins  including the posterior tibial, peroneal and gastrocnemius veins when visible. The superficial great saphenous vein was also interrogated. Spectral Doppler was utilized to evaluate flow at rest and with distal augmentation maneuvers in the common femoral, femoral and popliteal veins. COMPARISON:  None. FINDINGS: RIGHT LOWER EXTREMITY Common Femoral Vein: No evidence of thrombus. Normal compressibility, respiratory phasicity and response to augmentation. Saphenofemoral Junction: No evidence of thrombus. Normal compressibility and flow on color Doppler imaging. Profunda Femoral Vein: No evidence of thrombus. Normal compressibility and flow on color Doppler imaging. Femoral Vein: No evidence of thrombus. Normal compressibility, respiratory phasicity and response to augmentation. Popliteal Vein: No evidence of thrombus. Normal compressibility, respiratory phasicity and response to augmentation. Calf Veins: No evidence of thrombus. Normal compressibility and flow on color Doppler imaging. Superficial Great Saphenous Vein: No evidence of thrombus. Normal compressibility. Venous Reflux:  None. Other Findings: No evidence of superficial thrombophlebitis or abnormal fluid collection. LEFT LOWER EXTREMITY Common Femoral Vein: No evidence of thrombus. Normal compressibility, respiratory phasicity and response to augmentation. Saphenofemoral Junction: No evidence of thrombus. Normal compressibility and flow on color Doppler imaging. Profunda Femoral Vein: No evidence of thrombus. Normal compressibility  and flow on color Doppler imaging. Femoral Vein: No evidence of thrombus. Normal compressibility, respiratory phasicity and response to augmentation. Popliteal Vein: No evidence of thrombus. Normal compressibility, respiratory phasicity and response to augmentation. Calf Veins: No evidence of thrombus. Normal compressibility and flow on color Doppler imaging. Superficial Great Saphenous Vein: No evidence of thrombus. Normal compressibility. Venous Reflux:  None. Other Findings: No evidence of superficial thrombophlebitis or abnormal fluid collection. IMPRESSION: No evidence of deep venous thrombosis in either lower extremity. Electronically Signed   By: Aletta Edouard M.D.   On: 08/05/2019 14:00   Dg Chest Port 1 View  Result Date: 08/06/2019 CLINICAL DATA:  Acute respiratory failure EXAM: PORTABLE CHEST 1 VIEW COMPARISON:  Radiograph 08/05/2019 FINDINGS: Endotracheal tube in the mid trachea, 3 cm from the carina. Transesophageal tube side port is positioned at the GE junction and should be advanced 2-3 cm for optimal function. Dual lumen right IJ approach dialysis catheter tips terminate in the lower SVC. Pacer pads overlie the chest. Cardiac monitoring leads are present as well. Left axillary vascular stent is noted. Diffuse interstitial opacities again seen throughout the lungs. More dense opacities present in the left lung base. Small left effusion remains. No pneumothorax. IMPRESSION: 1. Endotracheal tube in the mid trachea, 3 cm from the carina. 2. Transesophageal tube side port is positioned at the GE junction and should be advanced 2-3 cm for optimal function. 3. Unchanged interstitial opacities throughout the lungs, which could reflect interstitial edema. 4. More dense opacity in the left lung base could reflect atelectasis, pneumonia or layering effusion. Electronically Signed   By: Lovena Le M.D.   On: 08/06/2019 05:49   Dg Chest Port 1 View  Result Date: 08/05/2019 CLINICAL DATA:   Intubation EXAM: PORTABLE CHEST 1 VIEW COMPARISON:  08/03/2019, 07/27/2019, 07/21/2019 FINDINGS: Interval intubation, tip of the endotracheal tube is about 3.1 cm superior to the carina. Right-sided central venous catheter tips over the SVC and proximal right atrium. Cardiomegaly with vascular congestion and diffuse interstitial and ground-glass opacity consistent with edema. Small left effusion likely layering. Dense left lung base atelectasis or pneumonia. IMPRESSION: 1. Endotracheal tube tip about 3.1 cm superior to carina 2. Slightly improved aeration on the right. 3. Cardiomegaly with vascular congestion and pulmonary edema. Asymmetric opacity left thorax likely due to layering pleural effusion. Dense  left lung base atelectasis or pneumonia Electronically Signed   By: Donavan Foil M.D.   On: 08/05/2019 04:02   Dg Abd Portable 1v  Result Date: 08/05/2019 CLINICAL DATA:  Nasal/orogastric tube placement. EXAM: PORTABLE ABDOMEN - 1 VIEW COMPARISON:  CT, 07/20/2019. FINDINGS: Nasal/orogastric tube passes below the diaphragm. Tip lies in the proximal stomach. Side hole lies in the expected location of the GE junction. Normal bowel gas pattern. Vascular stents in the upper abdomen centrally. IMPRESSION: 1. Nasal/orogastric tube tip in the proximal stomach. Side hole projects at the GE junction. 2. Normal bowel gas pattern. No evidence of a generalized adynamic ileus. No evidence of a bowel obstruction. Electronically Signed   By: Lajean Manes M.D.   On: 08/05/2019 13:59     Medications:   . sodium chloride    . sodium chloride    . sodium chloride    . sodium chloride    . ceFEPime (MAXIPIME) IV    . famotidine (PEPCID) IV Stopped (08/05/19 1903)  . fentaNYL infusion INTRAVENOUS 75 mcg/hr (08/06/19 1143)  . heparin 750 Units/hr (08/06/19 1143)  . levETIRAcetam Stopped (08/06/19 0751)  . norepinephrine (LEVOPHED) Adult infusion Stopped (08/05/19 1043)  . propofol (DIPRIVAN) infusion 40 mcg/kg/min  (08/06/19 1143)   . aspirin  81 mg Per Tube Daily  . chlorhexidine gluconate (MEDLINE KIT)  15 mL Mouth Rinse BID  . Chlorhexidine Gluconate Cloth  6 each Topical Q0600  . hydrocortisone sod succinate (SOLU-CORTEF) inj  100 mg Intravenous Q12H  . insulin aspart  0-9 Units Subcutaneous Q4H  . ipratropium-albuterol  3 mL Nebulization Q6H  . mouth rinse  15 mL Mouth Rinse 10 times per day  . pantoprazole (PROTONIX) IV  40 mg Intravenous QHS  . sodium chloride flush  3 mL Intravenous Q12H   sodium chloride, sodium chloride, sodium chloride, acetaminophen, albuterol, alteplase, heparin, labetalol, lidocaine (PF), lidocaine-prilocaine, nitroGLYCERIN, ondansetron (ZOFRAN) IV, pentafluoroprop-tetrafluoroeth, promethazine, sodium chloride flush  Assessment/ Plan:  43 y.o. male with end-stage renal disease on hemodialysis, hypertension, peripheral vascular disease, depression.  [place] TTS    1.  ESRD on HD TTS.  Patient due for dialysis treatment today.  Orders have been prepared.  2.  Acute respiratory failure/PEA arrest.  Still remains on the ventilator.  When sedation was weaned earlier in the day he was apparently purposeful.  Continue to attempt weaning from the ventilator.  3.  Anemia of chronic kidney disease.  Start the patient on Epogen 10,000 units IV with dialysis.  4.  Secondary hyperparathyroidism.  Renvela on hold at the moment as he is not eating orally at this time.   LOS: 2 Brett Small 10/15/20201:24 PM

## 2019-08-06 NOTE — Progress Notes (Signed)
HD Tx Completed:  Tx completed at 1555   08/06/19 1556  Vital Signs  Temp 98.4 F (36.9 C)  Temp Source Rectal  Pulse Rate 84  Pulse Rate Source Monitor  Resp 17  BP 108/65  BP Location Right Arm  BP Method Automatic  Patient Position (if appropriate) Lying  Oxygen Therapy  SpO2 100 %  O2 Device Ventilator  End Tidal CO2 (EtCO2) 27  Critical Care Pain Observation Tool (CPOT)  Facial Expression 0  Body Movements 1  Muscle Tension 0  Compliance with ventilator (intubated pts.) N/A  Vocalization (extubated pts.) N/A  CPOT Total 1  During Hemodialysis Assessment  KECN 54 KECN  Dialysis Fluid Bolus Normal Saline  Bolus Amount (mL) 200 mL  Intra-Hemodialysis Comments Tx completed;Tolerated well

## 2019-08-06 NOTE — Procedures (Signed)
Patient Name: Brett Small  MRN: [id number]  Epilepsy Attending: Lora Havens  Referring Physician/Provider: Dr. Leotis Pain Date: 08/05/2019 Duration: 41.43 minutes  Patient history: 43 year old male with seizure-like episode and cardiac arrest.  EEG to evaluate for seizures.  Level of alertness: Coma/sedated  AEDs during EEG study: Keppra, propofol  Technical aspects: This EEG study was done with scalp electrodes positioned according to the 10-20 International system of electrode placement. Electrical activity was acquired at a sampling rate of 500Hz  and reviewed with a high frequency filter of 70Hz  and a low frequency filter of 1Hz . EEG data were recorded continuously and digitally stored.   Description: EEG showed continuous generalized 2 to 5 Hz theta-delta slowing.  EEG was reactive to tactile stimulation.  Hyperventilation and photic stimulation were not performed.   Abnormality -Continued slow, generalized  IMPRESSION: This study is suggestive of severe diffuse encephalopathy, which could be secondary to sedated state, diffuse anoxic/hypoxic brain injury. No seizures or epileptiform discharges were seen throughout the recording.

## 2019-08-06 NOTE — Progress Notes (Signed)
ANTICOAGULATION CONSULT NOTE  Pharmacy Consult for Heparin Indication: ACS/STEMI  Patient Measurements: Height: 5' 0.98" (154.9 cm) Weight: 109 lb 2 oz (49.5 kg) IBW/kg (Calculated) : 52.26 HEPARIN DW (KG): 43.4  Vital Signs: Temp: 96.8 F (36 C) (10/15 0500) Temp Source: Rectal (10/15 0500) BP: 119/90 (10/15 0500) Pulse Rate: 70 (10/15 0500)  Labs: Recent Labs    08/03/19 1119  08/05/19 0431 08/05/19 0744  08/05/19 0906 08/05/19 1132 08/05/19 1318 08/05/19 1819 08/05/19 2325 08/06/19 0422  HGB  --    < > 8.2*  --   --   --  8.2*  --  8.3*  --  8.0*  HCT  --    < > 26.8*  --   --   --  26.3*  --  26.3*  --  25.0*  PLT  --    < > 135*  --   --   --  134*  --  122*  --  116*  APTT  --   --   --   --    < > 59*  --  69*  --  89* 77*  LABPROT  --   --  17.3*  --   --  18.2*  --   --   --   --   --   INR  --   --  1.4*  --   --  1.5*  --   --   --   --   --   CREATININE  --    < > 2.63*  --   --  2.87*  --   --  3.44*  --  3.65*  TROPONINIHS 47*  --  31* 72*  --   --   --   --   --   --   --    < > = values in this interval not displayed.   Estimated Creatinine Clearance: 17.9 mL/min (A) (by C-G formula based on SCr of 3.65 mg/dL (H)).  Medical History: Past Medical History:  Diagnosis Date  . ESRD (end stage renal disease) ([place])   . Hypertension   . PAD (peripheral artery disease) ([facility])    Required aortofemoral stent-which had closed and had to redo the procedure and ischemia of limb.  . Peripheral vascular disease ([place])   . Renal disorder   . Secondary hyperparathyroidism of renal origin [facility])    Medications:  Medications Prior to Admission  Medication Sig Dispense Refill Last Dose  . amLODipine (NORVASC) 10 MG tablet Take 1 tablet (10 mg total) by mouth daily at 8 pm. 30 tablet 2 unknown at unknown  . apixaban (ELIQUIS) 2.5 MG TABS tablet Take 1 tablet (2.5 mg total) by mouth 2 (two) times daily. 60 tablet 1 unknown at unknown  . aspirin EC 81 MG tablet Take 1  tablet (81 mg total) by mouth daily. 30 tablet 1 unknown at unknown  . atorvastatin (LIPITOR) 80 MG tablet Take 1 tablet (80 mg total) by mouth at bedtime. 30 tablet 1 unknown at unknown  . b complex-C-folic acid 1 MG capsule Take 1 capsule by mouth daily after supper.    unknown at unknown  . carvedilol (COREG) 25 MG tablet Take 1 tablet (25 mg total) by mouth 2 (two) times daily with a meal. 60 tablet 1 unknown at unknown  . gabapentin (NEURONTIN) 100 MG capsule Take 1 capsule (100 mg total) by mouth 3 (three) times daily. 90 capsule 1 unknown at unknown  .  hydrALAZINE (APRESOLINE) 25 MG tablet Take 1 tablet (25 mg total) by mouth every 8 (eight) hours. 90 tablet 1 unknown at unknown  . hydrOXYzine (ATARAX/VISTARIL) 25 MG tablet Take 25 mg by mouth 3 (three) times daily as needed for anxiety.   unknown at unknown  . loratadine (CLARITIN) 10 MG tablet Take 1 tablet (10 mg total) by mouth daily. 30 tablet 0 unknown at unknown  . multivitamin (RENA-VIT) TABS tablet Take 1 tablet by mouth daily. 30 each 1 unknown at unknown  . neomycin-bacitracin-polymyxin (NEOSPORIN) OINT Apply 1 application topically 2 (two) times daily. 56 g 1 unknown at unknown  . Nutritional Supplements (FEEDING SUPPLEMENT, NEPRO CARB STEADY,) LIQD Take 237 mLs by mouth 2 (two) times daily between meals. 237 mL 30 unknown at unknown  . sevelamer carbonate (RENVELA) 800 MG tablet Take 2 tablets (1,600 mg total) by mouth 3 (three) times daily with meals. 180 tablet 1 unknown at unknown    Assessment: Pharmacy asked to initiate Heparin protocol for a suspected PE vs CVA s/p MI this am.  Pt has been on Apixaban w/ last dose being given on 10/13 at 2117.  H&H, PLT trending down: continue to monitor  Heparin Course: 10/14 am initiation: 1000 unit bolus, then 700 units/hr 10/14 1318: inc to 750 units/hr 10/14 2325 aPTT = 89, therapeutic 10/15 0422 aPTT = 77, therapeutic x 2.  CBC stable.   Goal of Therapy:  aPTT 66-102  seconds Monitor platelets by anticoagulation protocol: Yes   Plan:   aPTT is within goal range, continue Heparin infusion at 750 units/hr  Check aPTT in am  CBC in am  Ena Dawley, PharmD 08/06/2019,5:24 AM

## 2019-08-06 NOTE — Progress Notes (Signed)
Subjective: Don't see any movements of the tongue today. On propofol   Past Medical History:  Diagnosis Date  . ESRD (end stage renal disease) ([place])   . Hypertension   . PAD (peripheral artery disease) ([facility])    Required aortofemoral stent-which had closed and had to redo the procedure and ischemia of limb.  . Peripheral vascular disease ([place])   . Renal disorder   . Secondary hyperparathyroidism of renal origin [facility])     Past Surgical History:  Procedure Laterality Date  . A/V SHUNT INTERVENTION Left 01/19/2019   Procedure: LEFT UPPER EXTREMITY A/V SHUNTOGRAM / UPPER EXTREMITY ANGIOGRAM;  Surgeon: Algernon Huxley, MD;  Location: [place] [facility];  Service: Cardiovascular;  Laterality: Left;  . A/V SHUNT INTERVENTION Left 03/02/2019   Procedure: A/V SHUNT INTERVENTION;  Surgeon: Algernon Huxley, MD;  Location: [place] [facility];  Service: Cardiovascular;  Laterality: Left;  . AORTA - FEMORAL ARTERY BYPASS GRAFT    . AV FISTULA PLACEMENT Left 12/26/2018   Procedure: INSERTION OF GORE STRETCH VASCULAR 4-7MM X  45CM IN LEFT UPPER ARM;  Surgeon: Marty Heck, MD;  Location: [place];  Service: Vascular;  Laterality: Left;  . DIALYSIS/PERMA CATHETER REMOVAL N/A 02/06/2019   Procedure: DIALYSIS/PERMA CATHETER REMOVAL;  Surgeon: Algernon Huxley, MD;  Location: [place] [facility];  Service: Cardiovascular;  Laterality: N/A;  . LEFT HEART CATH AND CORONARY ANGIOGRAPHY Right 07/10/2019   Procedure: LEFT HEART CATH AND CORONARY ANGIOGRAPHY;  Surgeon: Dionisio David, MD;  Location: [place] [facility];  Service: Cardiovascular;  Laterality: Right;  . PERIPHERAL VASCULAR THROMBECTOMY Left 07/28/2019   Procedure: Left Upper Extremity Dialysis Access [place];  Surgeon: Katha Cabal, MD;  Location: [place] [facility];  Service: Cardiovascular;  Laterality: Left;    Family History  Problem Relation Age of Onset  . Hypertension Other   . Diabetes Other   . Clotting disorder Father     Social  History:  reports that he has been smoking cigarettes. He has a 15.00 pack-year smoking history. He has never used smokeless tobacco. He reports previous alcohol use. He reports current drug use. Frequency: 1.00 time per week. Drug: Marijuana.  Allergies  Allergen Reactions  . Codeine Nausea Only    Patient questioned this (??)  . Sulfa Antibiotics Hives and Nausea And Vomiting    Medications: I have reviewed the patient's current medications.  ROS: Unable to obtain   Physical Examination: Blood pressure 103/68, pulse 68, temperature 97.9 F (36.6 C), temperature source Rectal, resp. rate 20, height 5' 0.98" (1.549 m), weight 49.5 kg, SpO2 100 %.  Patient is sedated, not following commands R pupils 3 mm and left is 73mm but R one appears post surgical  Extensor posturing in his upper extremities.    Laboratory Studies:   Basic Metabolic Panel: Recent Labs  Lab 08/04/19 0845 08/05/19 0431 08/05/19 0906 08/05/19 1819 08/06/19 0422  NA 139 140 137 137 138  K 3.4* 5.0 5.3* 4.7 4.8  CL 99 103 100 100 103  CO2 26 28 23 22  19*  GLUCOSE 82 164* 129* 98 112*  BUN 18 21* 23* 30* 36*  CREATININE 2.86* 2.63* 2.87* 3.44* 3.65*  CALCIUM 8.3* 9.5 9.1 9.1 8.7*  PHOS 3.9  --   --   --   --     Liver Function Tests: Recent Labs  Lab 08/03/19 0925 08/04/19 0845 08/05/19 0431 08/06/19 0422  AST 28  --  117* 79*  ALT 11  --  67* 66*  ALKPHOS 73  --  94 75  BILITOT 1.5*  --  1.2 1.7*  PROT 6.4*  --  6.2* 5.8*  ALBUMIN 2.9* 2.7* 2.6* 2.4*   No results for input(s): LIPASE, AMYLASE in the last 168 hours. No results for input(s): AMMONIA in the last 168 hours.  CBC: Recent Labs  Lab 08/03/19 0925  08/04/19 0349 08/05/19 0431 08/05/19 1132 08/05/19 1819 08/06/19 0422  WBC 17.2*   < > 10.1 12.3* 12.1* 8.0 6.9  NEUTROABS 14.1*  --  8.0* 10.7* 9.9* 7.1  --   HGB 10.4*   < > 9.2* 8.2* 8.2* 8.3* 8.0*  HCT 33.6*   < > 28.8* 26.8* 26.3* 26.3* 25.0*  MCV 104.7*   < > 100.0 104.3*  102.7* 102.3* 99.6  PLT 268   < > 150 135* 134* 122* 116*   < > = values in this interval not displayed.    Cardiac Enzymes: No results for input(s): CKTOTAL, CKMB, CKMBINDEX, TROPONINI in the last 168 hours.  BNP: Invalid input(s): POCBNP  CBG: Recent Labs  Lab 08/05/19 1630 08/05/19 2021 08/05/19 2340 08/06/19 0315 08/06/19 0732  GLUCAP 97 92 96 96 100*    Microbiology: Results for orders placed or performed during the hospital encounter of 08/03/19  SARS Coronavirus 2 by RT PCR (hospital order, performed in [facility] hospital lab) Nasopharyngeal Nasopharyngeal Swab     Status: None   Collection Time: 08/03/19  9:25 AM   Specimen: Nasopharyngeal Swab  Result Value Ref Range Status   SARS Coronavirus 2 NEGATIVE NEGATIVE Final    Comment: (NOTE) If result is NEGATIVE SARS-CoV-2 target nucleic acids are NOT DETECTED. The SARS-CoV-2 RNA is generally detectable in upper and lower  respiratory specimens during the acute phase of infection. The lowest  concentration of SARS-CoV-2 viral copies this assay can detect is 250  copies / mL. A negative result does not preclude SARS-CoV-2 infection  and should not be used as the sole basis for treatment or other  patient management decisions.  A negative result may occur with  improper specimen collection / handling, submission of specimen other  than nasopharyngeal swab, presence of viral mutation(s) within the  areas targeted by this assay, and inadequate number of viral copies  (<250 copies / mL). A negative result must be combined with clinical  observations, patient history, and epidemiological information. If result is POSITIVE SARS-CoV-2 target nucleic acids are DETECTED. The SARS-CoV-2 RNA is generally detectable in upper and lower  respiratory specimens dur ing the acute phase of infection.  Positive  results are indicative of active infection with SARS-CoV-2.  Clinical  correlation with patient history and other  diagnostic information is  necessary to determine patient infection status.  Positive results do  not rule out bacterial infection or co-infection with other viruses. If result is PRESUMPTIVE POSTIVE SARS-CoV-2 nucleic acids MAY BE PRESENT.   A presumptive positive result was obtained on the submitted specimen  and confirmed on repeat testing.  While 2019 novel coronavirus  (SARS-CoV-2) nucleic acids may be present in the submitted sample  additional confirmatory testing may be necessary for epidemiological  and / or clinical management purposes  to differentiate between  SARS-CoV-2 and other Sarbecovirus currently known to infect humans.  If clinically indicated additional testing with an alternate test  methodology [phone]) is advised. The SARS-CoV-2 RNA is generally  detectable in upper and lower respiratory sp ecimens during the acute  phase  of infection. The expected result is Negative. Fact Sheet for Patients:  StrictlyIdeas.no Fact Sheet for Healthcare Providers: BankingDealers.co.za This test is not yet approved or cleared by the [place] FDA and has been authorized for detection and/or diagnosis of SARS-CoV-2 by FDA under an Emergency Use Authorization (EUA).  This EUA will remain in effect (meaning this test can be used) for the duration of the COVID-19 declaration under Section 564(b)(1) of the Act, 21 U.S.C. section 360bbb-3(b)(1), unless the authorization is terminated or revoked sooner. Performed at [facility], [address] [place], [place] [address]   Blood culture (routine x 2)     Status: None (Preliminary result)   Collection Time: 08/03/19 10:05 AM   Specimen: BLOOD  Result Value Ref Range Status   Specimen Description BLOOD BLOOD RIGHT FOREARM  Final   Special Requests   Final    BOTTLES DRAWN AEROBIC AND ANAEROBIC Blood Culture adequate volume   Culture   Final    NO GROWTH 3 DAYS Performed  at [facility], [address] [place], [place] [address]    Report Status PENDING  Incomplete  MRSA PCR Screening     Status: None   Collection Time: 08/04/19  7:56 AM   Specimen: Nasopharyngeal  Result Value Ref Range Status   MRSA by PCR NEGATIVE NEGATIVE Final    Comment:        The GeneXpert MRSA Assay (FDA approved for NASAL specimens only), is one component of a comprehensive MRSA colonization surveillance program. It is not intended to diagnose MRSA infection nor to guide or monitor treatment for MRSA infections. Performed at [facility], [address] [place], [place] [address]   Culture, respiratory (non-expectorated)     Status: None (Preliminary result)   Collection Time: 08/05/19  8:40 PM   Specimen: Tracheal Aspirate; Respiratory  Result Value Ref Range Status   Specimen Description   Final    TRACHEAL ASPIRATE Performed at [facility], [address], [place], [place] [address]    Special Requests   Final    Normal Performed at [facility], [address] [place], [place] [address]    Gram Stain   Final    FEW WBC PRESENT,BOTH PMN AND MONONUCLEAR NO ORGANISMS SEEN Performed at [facility], [place] [address] [place], [place] [address]    Culture PENDING  Incomplete   Report Status PENDING  Incomplete    Coagulation Studies: Recent Labs    08/05/19 0431 08/05/19 0906  LABPROT 17.3* 18.2*  INR 1.4* 1.5*    Urinalysis:  Recent Labs  Lab 08/05/19 2049  COLORURINE AMBER*  LABSPEC 1.023  PHURINE 6.0  GLUCOSEU NEGATIVE  HGBUR MODERATE*  BILIRUBINUR NEGATIVE  KETONESUR NEGATIVE  PROTEINUR 100*  NITRITE NEGATIVE  LEUKOCYTESUR SMALL*    Lipid Panel:     Component Value Date/Time   CHOL 112 07/10/2019 0630   TRIG 62 08/05/2019 0431   HDL 43 07/10/2019 0630   CHOLHDL 2.6 07/10/2019 0630   VLDL 14 07/10/2019 0630   LDLCALC 55 07/10/2019 0630    HgbA1C:  Lab Results  Component Value  Date   HGBA1C 4.7 (L) 07/09/2019    Urine Drug Screen:      Component Value Date/Time   LABOPIA NONE DETECTED 08/05/2019 2049   COCAINSCRNUR NONE DETECTED 08/05/2019 2049   LABBENZ POSITIVE (A) 08/05/2019 2049   AMPHETMU NONE DETECTED 08/05/2019 2049   THCU POSITIVE (A) 08/05/2019 2049   LABBARB  NONE DETECTED 08/05/2019 2049    Alcohol Level: No results for input(s): ETH in the last 168 hours.   Imaging: Ct Head Wo Contrast  Result Date: 08/05/2019 CLINICAL DATA:  Cardiac arrest EXAM: CT HEAD WITHOUT CONTRAST TECHNIQUE: Contiguous axial images were obtained from the base of the skull through the vertex without intravenous contrast. COMPARISON:  None. FINDINGS: Brain: No evidence of acute infarction, hemorrhage, hydrocephalus, extra-axial collection or mass lesion/mass effect. Vascular: No hyperdense vessel or unexpected calcification. Skull: Normal. Negative for fracture or focal lesion. Sinuses/Orbits: No acute finding. IMPRESSION: Negative head CT.  No evidence of anoxic injury Electronically Signed   By: Monte Fantasia M.D.   On: 08/05/2019 06:02   US Venous Img Lower Bilateral  Result Date: 08/05/2019 CLINICAL DATA:  Bilateral lower extremity pain and edema. EXAM: BILATERAL LOWER EXTREMITY VENOUS DOPPLER ULTRASOUND TECHNIQUE: Gray-scale sonography with graded compression, as well as color Doppler and duplex ultrasound were performed to evaluate the lower extremity deep venous systems from the level of the common femoral vein and including the common femoral, femoral, profunda femoral, popliteal and calf veins including the posterior tibial, peroneal and gastrocnemius veins when visible. The superficial great saphenous vein was also interrogated. Spectral Doppler was utilized to evaluate flow at rest and with distal augmentation maneuvers in the common femoral, femoral and popliteal veins. COMPARISON:  None. FINDINGS: RIGHT LOWER EXTREMITY Common Femoral Vein: No evidence of thrombus.  Normal compressibility, respiratory phasicity and response to augmentation. Saphenofemoral Junction: No evidence of thrombus. Normal compressibility and flow on color Doppler imaging. Profunda Femoral Vein: No evidence of thrombus. Normal compressibility and flow on color Doppler imaging. Femoral Vein: No evidence of thrombus. Normal compressibility, respiratory phasicity and response to augmentation. Popliteal Vein: No evidence of thrombus. Normal compressibility, respiratory phasicity and response to augmentation. Calf Veins: No evidence of thrombus. Normal compressibility and flow on color Doppler imaging. Superficial Great Saphenous Vein: No evidence of thrombus. Normal compressibility. Venous Reflux:  None. Other Findings: No evidence of superficial thrombophlebitis or abnormal fluid collection. LEFT LOWER EXTREMITY Common Femoral Vein: No evidence of thrombus. Normal compressibility, respiratory phasicity and response to augmentation. Saphenofemoral Junction: No evidence of thrombus. Normal compressibility and flow on color Doppler imaging. Profunda Femoral Vein: No evidence of thrombus. Normal compressibility and flow on color Doppler imaging. Femoral Vein: No evidence of thrombus. Normal compressibility, respiratory phasicity and response to augmentation. Popliteal Vein: No evidence of thrombus. Normal compressibility, respiratory phasicity and response to augmentation. Calf Veins: No evidence of thrombus. Normal compressibility and flow on color Doppler imaging. Superficial Great Saphenous Vein: No evidence of thrombus. Normal compressibility. Venous Reflux:  None. Other Findings: No evidence of superficial thrombophlebitis or abnormal fluid collection. IMPRESSION: No evidence of deep venous thrombosis in either lower extremity. Electronically Signed   By: Aletta Edouard M.D.   On: 08/05/2019 14:00   Dg Chest Port 1 View  Result Date: 08/06/2019 CLINICAL DATA:  Acute respiratory failure EXAM: PORTABLE  CHEST 1 VIEW COMPARISON:  Radiograph 08/05/2019 FINDINGS: Endotracheal tube in the mid trachea, 3 cm from the carina. Transesophageal tube side port is positioned at the GE junction and should be advanced 2-3 cm for optimal function. Dual lumen right IJ approach dialysis catheter tips terminate in the lower SVC. Pacer pads overlie the chest. Cardiac monitoring leads are present as well. Left axillary vascular stent is noted. Diffuse interstitial opacities again seen throughout the lungs. More dense opacities present in the left lung base. Small left effusion remains. No pneumothorax.  IMPRESSION: 1. Endotracheal tube in the mid trachea, 3 cm from the carina. 2. Transesophageal tube side port is positioned at the GE junction and should be advanced 2-3 cm for optimal function. 3. Unchanged interstitial opacities throughout the lungs, which could reflect interstitial edema. 4. More dense opacity in the left lung base could reflect atelectasis, pneumonia or layering effusion. Electronically Signed   By: Lovena Le M.D.   On: 08/06/2019 05:49   Dg Chest Port 1 View  Result Date: 08/05/2019 CLINICAL DATA:  Intubation EXAM: PORTABLE CHEST 1 VIEW COMPARISON:  08/03/2019, 07/27/2019, 07/21/2019 FINDINGS: Interval intubation, tip of the endotracheal tube is about 3.1 cm superior to the carina. Right-sided central venous catheter tips over the SVC and proximal right atrium. Cardiomegaly with vascular congestion and diffuse interstitial and ground-glass opacity consistent with edema. Small left effusion likely layering. Dense left lung base atelectasis or pneumonia. IMPRESSION: 1. Endotracheal tube tip about 3.1 cm superior to carina 2. Slightly improved aeration on the right. 3. Cardiomegaly with vascular congestion and pulmonary edema. Asymmetric opacity left thorax likely due to layering pleural effusion. Dense left lung base atelectasis or pneumonia Electronically Signed   By: Donavan Foil M.D.   On: 08/05/2019 04:02    Dg Abd Portable 1v  Result Date: 08/05/2019 CLINICAL DATA:  Nasal/orogastric tube placement. EXAM: PORTABLE ABDOMEN - 1 VIEW COMPARISON:  CT, 07/20/2019. FINDINGS: Nasal/orogastric tube passes below the diaphragm. Tip lies in the proximal stomach. Side hole lies in the expected location of the GE junction. Normal bowel gas pattern. Vascular stents in the upper abdomen centrally. IMPRESSION: 1. Nasal/orogastric tube tip in the proximal stomach. Side hole projects at the GE junction. 2. Normal bowel gas pattern. No evidence of a generalized adynamic ileus. No evidence of a bowel obstruction. Electronically Signed   By: Lajean Manes M.D.   On: 08/05/2019 13:59     Assessment/Plan: 43 y.o. male male with a known history end-stage renal disease on hemodialysis on Tuesday Thursday and Saturday, hypertension and peripheral arterial disease who presented to the emergency room with acute onset of dyspnea with associated dry cough and wheezing which have significantly worsened since this last morning. Patient is s/p cardiac arrest around 3 AM that lasted for about 10 minutes on hypothermia protocol now.  Patient was noted to have tongue twitching and fluttering of eyes that resolved with sedation.   - CTH done yesterday no acute abnormalities but will need repeat imaging in 2-3 days - extensor posturing is usually poor prognosticator - propofol restarted no more shaking episodes seen  - s/p Keppra - On Keppra 500 BID - EEG done and waiting for official read.  - guarded prognosis right now.  08/06/2019, 11:10 AM

## 2019-08-06 NOTE — Progress Notes (Signed)
Pharmacy Antibiotic Note  Brett Small is a 43 y.o. male admitted on 08/03/2019 with sepsis.  Pharmacy has been consulted for Vancomycin and Cefepime dosing. Vancomycin was d/c'ed on 10/14, (-) MRSA PCR. Pt w/ ESRD requiring dialysis. Patient plan on receiving dialysis on 10/15, and will continue cefepime dosing after dialysis. WBC trending down.  Plan: Continue Cefepime 2gm after each dialysis session, day 3 of therapy  Height: 5' 0.98" (154.9 cm) Weight: 109 lb 2 oz (49.5 kg) IBW/kg (Calculated) : 52.26  Temp (24hrs), Avg:96.8 F (36 C), Min:96.1 F (35.6 C), Max:97.9 F (36.6 C)  Recent Labs  Lab 08/03/19 0925  08/04/19 0349 08/04/19 0845 08/04/19 1412 08/04/19 1653 08/05/19 0431 08/05/19 0744 08/05/19 0906 08/05/19 1132 08/05/19 1819 08/06/19 0422  WBC 17.2*   < > 10.1  --   --   --  12.3*  --   --  12.1* 8.0 6.9  CREATININE 4.19*   < >  --  2.86*  --   --  2.63*  --  2.87*  --  3.44* 3.65*  LATICACIDVEN 4.1*  --   --   --  2.6* 1.9 3.2* 2.2*  --   --   --   --    < > = values in this interval not displayed.    Estimated Creatinine Clearance: 17.9 mL/min (A) (by C-G formula based on SCr of 3.65 mg/dL (H)).    Allergies  Allergen Reactions  . Codeine Nausea Only    Patient questioned this (??)  . Sulfa Antibiotics Hives and Nausea And Vomiting   Antimicrobials this admission: Rocephin 10/13 x 1 Azithromycin 10/13-10/14  Cefepime 10/12 >> Vancomycin 10/14 >>10/14  Dose adjustments this admission: n/a  Microbiology results:  BCx: pending, no growth x3 days  UCx: pending     Sputum: pending  MRSA PCR: negative  SARS-CoV-2: negative    Thank you for allowing pharmacy to be a part of this patient's care.  Sallye Lat, PharmD Candidate 08/06/2019 11:05 AM

## 2019-08-07 ENCOUNTER — Ambulatory Visit: Payer: Medicare Other | Admitting: Urology

## 2019-08-07 ENCOUNTER — Encounter: Payer: Self-pay | Admitting: Urology

## 2019-08-07 DIAGNOSIS — R569 Unspecified convulsions: Secondary | ICD-10-CM | POA: Diagnosis not present

## 2019-08-07 LAB — BASIC METABOLIC PANEL
Anion gap: 8 (ref 5–15)
BUN: 22 mg/dL — ABNORMAL HIGH (ref 6–20)
CO2: 27 mmol/L (ref 22–32)
Calcium: 8.4 mg/dL — ABNORMAL LOW (ref 8.9–10.3)
Chloride: 102 mmol/L (ref 98–111)
Creatinine, Ser: 2.57 mg/dL — ABNORMAL HIGH (ref 0.61–1.24)
GFR calc Af Amer: 34 mL/min — ABNORMAL LOW (ref >60)
GFR calc non Af Amer: 29 mL/min — ABNORMAL LOW (ref >60)
Glucose, Bld: 129 mg/dL — ABNORMAL HIGH (ref 70–99)
Potassium: 3.8 mmol/L (ref 3.5–5.1)
Sodium: 137 mmol/L (ref 135–145)

## 2019-08-07 LAB — GLUCOSE, CAPILLARY
Glucose-Capillary: 109 mg/dL — ABNORMAL HIGH (ref 70–99)
Glucose-Capillary: 113 mg/dL — ABNORMAL HIGH (ref 70–99)
Glucose-Capillary: 114 mg/dL — ABNORMAL HIGH (ref 70–99)
Glucose-Capillary: 119 mg/dL — ABNORMAL HIGH (ref 70–99)
Glucose-Capillary: 123 mg/dL — ABNORMAL HIGH (ref 70–99)
Glucose-Capillary: 132 mg/dL — ABNORMAL HIGH (ref 70–99)

## 2019-08-07 LAB — CBC
HCT: 23.5 % — ABNORMAL LOW (ref 39.0–52.0)
Hemoglobin: 7.7 g/dL — ABNORMAL LOW (ref 13.0–17.0)
MCH: 31.8 pg (ref 26.0–34.0)
MCHC: 32.8 g/dL (ref 30.0–36.0)
MCV: 97.1 fL (ref 80.0–100.0)
Platelets: 122 10*3/uL — ABNORMAL LOW (ref 150–400)
RBC: 2.42 MIL/uL — ABNORMAL LOW (ref 4.22–5.81)
RDW: 17.6 % — ABNORMAL HIGH (ref 11.5–15.5)
WBC: 7.4 10*3/uL (ref 4.0–10.5)
nRBC: 0 % (ref 0.0–0.2)

## 2019-08-07 LAB — LEGIONELLA PNEUMOPHILA SEROGP 1 UR AG: L. pneumophila Serogp 1 Ur Ag: NEGATIVE

## 2019-08-07 LAB — APTT: aPTT: 66 seconds — ABNORMAL HIGH (ref 24–36)

## 2019-08-07 LAB — MAGNESIUM: Magnesium: 1.7 mg/dL (ref 1.7–2.4)

## 2019-08-07 MED ORDER — POTASSIUM CHLORIDE 20 MEQ PO PACK: 40.0000 meq | PACK | Freq: Once | ORAL | Status: DC

## 2019-08-07 MED ORDER — POTASSIUM CHLORIDE 10 MEQ/100ML IV SOLN
10.0000 meq | Freq: Once | INTRAVENOUS | Status: AC
  Administered 2019-08-07: 10 meq via INTRAVENOUS
  Filled 2019-08-07: qty 100

## 2019-08-07 MED ORDER — MAGNESIUM SULFATE 2 GM/50ML IV SOLN
2.0000 g | Freq: Once | INTRAVENOUS | Status: AC
  Administered 2019-08-07: 2 g via INTRAVENOUS
  Filled 2019-08-07: qty 50

## 2019-08-07 NOTE — Progress Notes (Signed)
Pt extubated with no complications, placed on 3lpm [place], respiratory rate 12/min, sats 98%. Will continue to monitor.

## 2019-08-07 NOTE — Progress Notes (Signed)
ANTICOAGULATION CONSULT NOTE  Pharmacy Consult for Heparin Indication: ACS/STEMI  Patient Measurements: Height: 5' 0.98" (154.9 cm) Weight: 109 lb 9.1 oz (49.7 kg) IBW/kg (Calculated) : 52.26 HEPARIN DW (KG): 43.4  Vital Signs: Temp: 98.4 F (36.9 C) (10/16 0400) Temp Source: Rectal (10/16 0400) BP: 120/81 (10/16 0400) Pulse Rate: 70 (10/16 0400)  Labs: Recent Labs    08/05/19 0431 08/05/19 0744 08/05/19 0906  08/05/19 2325 08/06/19 0422 08/06/19 1016 08/06/19 1958 08/07/19 0312  HGB 8.2*  --   --    < >  --  8.0*  --  7.4* 7.7*  HCT 26.8*  --   --    < >  --  25.0*  --  22.8* 23.5*  PLT 135*  --   --    < >  --  116*  --  124* 122*  APTT  --   --  59*   < > 89* 77*  --   --  66*  LABPROT 17.3*  --  18.2*  --   --   --   --   --   --   INR 1.4*  --  1.5*  --   --   --   --   --   --   HEPARINUNFRC  --   --   --   --   --   --  0.85*  --   --   CREATININE 2.63*  --  2.87*   < >  --  3.65*  --  2.16* 2.57*  TROPONINIHS 31* 72*  --   --   --   --   --   --   --    < > = values in this interval not displayed.   Estimated Creatinine Clearance: 25.5 mL/min (A) (by C-G formula based on SCr of 2.57 mg/dL (H)).  Medical History: Past Medical History:  Diagnosis Date  . ESRD (end stage renal disease) ([place])   . Hypertension   . PAD (peripheral artery disease) ([facility])    Required aortofemoral stent-which had closed and had to redo the procedure and ischemia of limb.  . Peripheral vascular disease ([facility])   . Renal disorder   . Secondary hyperparathyroidism of renal origin [facility])    Medications:  Medications Prior to Admission  Medication Sig Dispense Refill Last Dose  . amLODipine (NORVASC) 10 MG tablet Take 1 tablet (10 mg total) by mouth daily at 8 pm. 30 tablet 2 unknown at unknown  . apixaban (ELIQUIS) 2.5 MG TABS tablet Take 1 tablet (2.5 mg total) by mouth 2 (two) times daily. 60 tablet 1 unknown at unknown  . aspirin EC 81 MG tablet Take 1 tablet (81 mg total) by mouth  daily. 30 tablet 1 unknown at unknown  . atorvastatin (LIPITOR) 80 MG tablet Take 1 tablet (80 mg total) by mouth at bedtime. 30 tablet 1 unknown at unknown  . b complex-C-folic acid 1 MG capsule Take 1 capsule by mouth daily after supper.    unknown at unknown  . carvedilol (COREG) 25 MG tablet Take 1 tablet (25 mg total) by mouth 2 (two) times daily with a meal. 60 tablet 1 unknown at unknown  . gabapentin (NEURONTIN) 100 MG capsule Take 1 capsule (100 mg total) by mouth 3 (three) times daily. 90 capsule 1 unknown at unknown  . hydrALAZINE (APRESOLINE) 25 MG tablet Take 1 tablet (25 mg total) by mouth every 8 (eight) hours. 90 tablet 1 unknown at unknown  .  hydrOXYzine (ATARAX/VISTARIL) 25 MG tablet Take 25 mg by mouth 3 (three) times daily as needed for anxiety.   unknown at unknown  . loratadine (CLARITIN) 10 MG tablet Take 1 tablet (10 mg total) by mouth daily. 30 tablet 0 unknown at unknown  . multivitamin (RENA-VIT) TABS tablet Take 1 tablet by mouth daily. 30 each 1 unknown at unknown  . neomycin-bacitracin-polymyxin (NEOSPORIN) OINT Apply 1 application topically 2 (two) times daily. 56 g 1 unknown at unknown  . Nutritional Supplements (FEEDING SUPPLEMENT, NEPRO CARB STEADY,) LIQD Take 237 mLs by mouth 2 (two) times daily between meals. 237 mL 30 unknown at unknown  . sevelamer carbonate (RENVELA) 800 MG tablet Take 2 tablets (1,600 mg total) by mouth 3 (three) times daily with meals. 180 tablet 1 unknown at unknown    Assessment: Pharmacy asked to initiate Heparin protocol for a suspected PE vs CVA s/p MI this am.  Pt has been on Apixaban w/ last dose being given on 10/13 at 2117.  H&H, PLT trending down: continue to monitor  Heparin Course: 10/14 am initiation: 1000 unit bolus, then 700 units/hr 10/14 1318: inc to 750 units/hr 10/14 2325 aPTT = 89, therapeutic 10/15 0422 aPTT = 77, therapeutic x 2.  CBC stable.  10/16 0312 aPTT = 66, therapeutic x 3.  CBC stable.   Goal of Therapy:   aPTT 66-102 seconds Monitor platelets by anticoagulation protocol: Yes   Plan:   aPTT is within goal range on low end, will increase Heparin infusion to 800 units/hr to discourage drop  Check aPTT in am  CBC in am  Ena Dawley, PharmD 08/07/2019,5:38 AM

## 2019-08-07 NOTE — Consult Note (Addendum)
[facility] for Electrolyte Monitoring and Replacement   Recent Labs: Potassium (mmol/L)  Date Value  08/07/2019 3.8   Magnesium (mg/dL)  Date Value  08/07/2019 1.7   Calcium (mg/dL)  Date Value  08/07/2019 8.4 (L)   Albumin (g/dL)  Date Value  08/06/2019 2.4 (L)   Phosphorus (mg/dL)  Date Value  08/06/2019 9.1 (H)   Sodium (mmol/L)  Date Value  08/07/2019 137   Corrected Ca: 9.68 mg/dL  Assessment: 43 y.o. male admitted on 08/03/2019 with sepsis. On 10/14, pt became suddenly altered and then unresponsive with agonal respirations, leading to respiratory arrest with bradycardia and eventual PEA arrest requiring approximately 10 minutes of CPR/ACLS before ROSC was achieved. Pt was intubated during arrest and remains intubated and sedated. He received 1 gram of magnesium sulfate 10/15, latest potassium supplementation on 10/13  Goal of Therapy:  Due to cardiac history: Potassium 4.0 - 5.1 mmol/L Magnesium 2.0 - 2.4 mg/dL Other electrolytes WNL  Plan:   Replace magnesium with 2 grams IV magnesium sulfate  Replace potassium with 10 mEq IV KCl  F/U BMP, magnesium in am  Dallie Piles ,PharmD Clinical Pharmacist 08/07/2019 9:05 AM

## 2019-08-07 NOTE — Progress Notes (Signed)
Pharmacy Antibiotic Note  Brett Small is a 43 y.o. male admitted on 08/03/2019 with sepsis due to pneumonia.  Pharmacy has been consulted for Vancomycin and Cefepime dosing. Vancomycin was d/c'ed on 10/14, (-) MRSA PCR. Pt w/ ESRD requiring dialysis. Patient plan on receiving dialysis on 10/17, and will continue cefepime dosing after dialysis. WBC trending down. Patient was extubated on 10/16.  Plan: Continue Cefepime 2gm after each dialysis session, day 4 of therapy  Height: 5' 0.98" (154.9 cm) Weight: 109 lb 9.1 oz (49.7 kg) IBW/kg (Calculated) : 52.26  Temp (24hrs), Avg:98.3 F (36.8 C), Min:98.1 F (36.7 C), Max:98.6 F (37 C)  Recent Labs  Lab 08/03/19 0925  08/04/19 1412 08/04/19 1653 08/05/19 0431 08/05/19 0744 08/05/19 0906 08/05/19 1132 08/05/19 1819 08/06/19 0422 08/06/19 1958 08/07/19 0312  WBC 17.2*   < >  --   --  12.3*  --   --  12.1* 8.0 6.9 8.2 7.4  CREATININE 4.19*   < >  --   --  2.63*  --  2.87*  --  3.44* 3.65* 2.16* 2.57*  LATICACIDVEN 4.1*  --  2.6* 1.9 3.2* 2.2*  --   --   --   --   --   --    < > = values in this interval not displayed.    Estimated Creatinine Clearance: 25.5 mL/min (A) (by C-G formula based on SCr of 2.57 mg/dL (H)).    Allergies  Allergen Reactions  . Codeine Nausea Only    Patient questioned this (??)  . Sulfa Antibiotics Hives and Nausea And Vomiting   Antimicrobials this admission: Rocephin 10/13 x 1 Azithromycin 10/13-10/14  Cefepime 10/12 >> Vancomycin 10/14 >>10/14  Dose adjustments this admission: n/a  Microbiology results:  BCx: pending, no growth x4 days  UCx: final, no growth     Sputum: pending  MRSA PCR: negative  SARS-CoV-2: negative    Thank you for allowing pharmacy to be a part of this patient's care.  Sallye Lat, PharmD Candidate 08/07/2019 11:17 AM

## 2019-08-07 NOTE — Progress Notes (Addendum)
Subjective: Examination improved as off sedation. Doesn't follow commands but withdraws form pain and moves all his extremities symmetrically.    Past Medical History:  Diagnosis Date  . ESRD (end stage renal disease) ([place])   . Hypertension   . PAD (peripheral artery disease) ([facility])    Required aortofemoral stent-which had closed and had to redo the procedure and ischemia of limb.  . Peripheral vascular disease ([place])   . Renal disorder   . Secondary hyperparathyroidism of renal origin [facility])     Past Surgical History:  Procedure Laterality Date  . A/V SHUNT INTERVENTION Left 01/19/2019   Procedure: LEFT UPPER EXTREMITY A/V SHUNTOGRAM / UPPER EXTREMITY ANGIOGRAM;  Surgeon: Algernon Huxley, MD;  Location: [facility];  Service: Cardiovascular;  Laterality: Left;  . A/V SHUNT INTERVENTION Left 03/02/2019   Procedure: A/V SHUNT INTERVENTION;  Surgeon: Algernon Huxley, MD;  Location: [facility];  Service: Cardiovascular;  Laterality: Left;  . AORTA - FEMORAL ARTERY BYPASS GRAFT    . AV FISTULA PLACEMENT Left 12/26/2018   Procedure: INSERTION OF GORE STRETCH VASCULAR 4-7MM X  45CM IN LEFT UPPER ARM;  Surgeon: Marty Heck, MD;  Location: [facility];  Service: Vascular;  Laterality: Left;  . DIALYSIS/PERMA CATHETER REMOVAL N/A 02/06/2019   Procedure: DIALYSIS/PERMA CATHETER REMOVAL;  Surgeon: Algernon Huxley, MD;  Location: [facility];  Service: Cardiovascular;  Laterality: N/A;  . LEFT HEART CATH AND CORONARY ANGIOGRAPHY Right 07/10/2019   Procedure: LEFT HEART CATH AND CORONARY ANGIOGRAPHY;  Surgeon: Dionisio David, MD;  Location: [facility];  Service: Cardiovascular;  Laterality: Right;  . PERIPHERAL VASCULAR THROMBECTOMY Left 07/28/2019   Procedure: Left Upper Extremity Dialysis Access Declot;  Surgeon: Katha Cabal, MD;  Location: [facility];  Service: Cardiovascular;  Laterality: Left;    Family History  Problem Relation Age of Onset  .  Hypertension Other   . Diabetes Other   . Clotting disorder Father     Social History:  reports that he has been smoking cigarettes. He has a 15.00 pack-year smoking history. He has never used smokeless tobacco. He reports previous alcohol use. He reports current drug use. Frequency: 1.00 time per week. Drug: Marijuana.  Allergies  Allergen Reactions  . Codeine Nausea Only    Patient questioned this (??)  . Sulfa Antibiotics Hives and Nausea And Vomiting    Medications: I have reviewed the patient's current medications.  ROS: Unable to obtain   Physical Examination: Blood pressure 115/81, pulse 70, temperature 98.4 F (36.9 C), resp. rate 20, height 5' 0.98" (1.549 m), weight 49.7 kg, SpO2 100 %.   Neurological Examination   Mental Status: Intubated  Cranial Nerves: II: Discs flat bilaterally; III,IV, VI: ptosis not present, extra-ocular motions intact bilaterally IX,X: gag reflex present Motor: Withdraws from pain symmetrically.      Laboratory Studies:   Basic Metabolic Panel: Recent Labs  Lab 08/04/19 0845  08/05/19 0906 08/05/19 1819 08/06/19 0422 08/06/19 1124 08/06/19 1958 08/07/19 0312  NA 139   < > 137 137 138  --  135 137  K 3.4*   < > 5.3* 4.7 4.8  --  3.6 3.8  CL 99   < > 100 100 103  --  99 102  CO2 26   < > 23 22 19*  --  26 27  GLUCOSE 82   < > 129* 98 112*  --  138* 129*  BUN 18   < >  23* 30* 36*  --  17 22*  CREATININE 2.86*   < > 2.87* 3.44* 3.65*  --  2.16* 2.57*  CALCIUM 8.3*   < > 9.1 9.1 8.7*  --  8.2* 8.4*  MG  --   --   --   --   --   --  1.6* 1.7  PHOS 3.9  --   --   --   --  9.1*  --   --    < > = values in this interval not displayed.    Liver Function Tests: Recent Labs  Lab 08/03/19 0925 08/04/19 0845 08/05/19 0431 08/06/19 0422  AST 28  --  117* 79*  ALT 11  --  67* 66*  ALKPHOS 73  --  94 75  BILITOT 1.5*  --  1.2 1.7*  PROT 6.4*  --  6.2* 5.8*  ALBUMIN 2.9* 2.7* 2.6* 2.4*   No results for input(s): LIPASE,  AMYLASE in the last 168 hours. No results for input(s): AMMONIA in the last 168 hours.  CBC: Recent Labs  Lab 08/04/19 0349 08/05/19 0431 08/05/19 1132 08/05/19 1819 08/06/19 0422 08/06/19 1958 08/07/19 0312  WBC 10.1 12.3* 12.1* 8.0 6.9 8.2 7.4  NEUTROABS 8.0* 10.7* 9.9* 7.1  --  7.0  --   HGB 9.2* 8.2* 8.2* 8.3* 8.0* 7.4* 7.7*  HCT 28.8* 26.8* 26.3* 26.3* 25.0* 22.8* 23.5*  MCV 100.0 104.3* 102.7* 102.3* 99.6 97.9 97.1  PLT 150 135* 134* 122* 116* 124* 122*    Cardiac Enzymes: No results for input(s): CKTOTAL, CKMB, CKMBINDEX, TROPONINI in the last 168 hours.  BNP: Invalid input(s): POCBNP  CBG: Recent Labs  Lab 08/06/19 1629 08/06/19 2000 08/06/19 2329 08/07/19 0322 08/07/19 0745  GLUCAP 114* 135* 110* 132* 25*    Microbiology: Results for orders placed or performed during the hospital encounter of 08/03/19  SARS Coronavirus 2 by RT PCR (hospital order, performed in [facility] hospital lab) Nasopharyngeal Nasopharyngeal Swab     Status: None   Collection Time: 08/03/19  9:25 AM   Specimen: Nasopharyngeal Swab  Result Value Ref Range Status   SARS Coronavirus 2 NEGATIVE NEGATIVE Final    Comment: (NOTE) If result is NEGATIVE SARS-CoV-2 target nucleic acids are NOT DETECTED. The SARS-CoV-2 RNA is generally detectable in upper and lower  respiratory specimens during the acute phase of infection. The lowest  concentration of SARS-CoV-2 viral copies this assay can detect is 250  copies / mL. A negative result does not preclude SARS-CoV-2 infection  and should not be used as the sole basis for treatment or other  patient management decisions.  A negative result may occur with  improper specimen collection / handling, submission of specimen other  than nasopharyngeal swab, presence of viral mutation(s) within the  areas targeted by this assay, and inadequate number of viral copies  (<250 copies / mL). A negative result must be combined with clinical   observations, patient history, and epidemiological information. If result is POSITIVE SARS-CoV-2 target nucleic acids are DETECTED. The SARS-CoV-2 RNA is generally detectable in upper and lower  respiratory specimens dur ing the acute phase of infection.  Positive  results are indicative of active infection with SARS-CoV-2.  Clinical  correlation with patient history and other diagnostic information is  necessary to determine patient infection status.  Positive results do  not rule out bacterial infection or co-infection with other viruses. If result is PRESUMPTIVE POSTIVE SARS-CoV-2 nucleic acids MAY BE PRESENT.   A  presumptive positive result was obtained on the submitted specimen  and confirmed on repeat testing.  While 2019 novel coronavirus  (SARS-CoV-2) nucleic acids may be present in the submitted sample  additional confirmatory testing may be necessary for epidemiological  and / or clinical management purposes  to differentiate between  SARS-CoV-2 and other Sarbecovirus currently known to infect humans.  If clinically indicated additional testing with an alternate test  methodology [id number]) is advised. The SARS-CoV-2 RNA is generally  detectable in upper and lower respiratory sp ecimens during the acute  phase of infection. The expected result is Negative. Fact Sheet for Patients:  StrictlyIdeas.no Fact Sheet for Healthcare Providers: BankingDealers.co.za This test is not yet approved or cleared by the [place] FDA and has been authorized for detection and/or diagnosis of SARS-CoV-2 by FDA under an Emergency Use Authorization (EUA).  This EUA will remain in effect (meaning this test can be used) for the duration of the COVID-19 declaration under Section 564(b)(1) of the Act, 21 U.S.C. section 360bbb-3(b)(1), unless the authorization is terminated or revoked sooner. Performed at [facility], [address] [place], [place] [address]   Blood culture (routine x 2)     Status: None (Preliminary result)   Collection Time: 08/03/19 10:05 AM   Specimen: BLOOD  Result Value Ref Range Status   Specimen Description BLOOD BLOOD RIGHT FOREARM  Final   Special Requests   Final    BOTTLES DRAWN AEROBIC AND ANAEROBIC Blood Culture adequate volume   Culture   Final    NO GROWTH 4 DAYS Performed at [facility], [address], [place], [place] [address]    Report Status PENDING  Incomplete  MRSA PCR Screening     Status: None   Collection Time: 08/04/19  7:56 AM   Specimen: Nasopharyngeal  Result Value Ref Range Status   MRSA by PCR NEGATIVE NEGATIVE Final    Comment:        The GeneXpert MRSA Assay (FDA approved for NASAL specimens only), is one component of a comprehensive MRSA colonization surveillance program. It is not intended to diagnose MRSA infection nor to guide or monitor treatment for MRSA infections. Performed at [facility], [address] [place], [place] [address]   Urine Culture     Status: None   Collection Time: 08/05/19 10:14 AM   Specimen: Urine, Random  Result Value Ref Range Status   Specimen Description   Final    URINE, RANDOM Performed at [facility], [address] [place], [place] [address]    Special Requests   Final    NONE Performed at [facility], [address] [place], [place] [address]    Culture   Final    NO GROWTH Performed at [facility], [place] [address], [place], [place] [address]    Report Status 08/06/2019 FINAL  Final  Culture, respiratory (non-expectorated)     Status: None (Preliminary result)   Collection Time: 08/05/19  8:40 PM   Specimen: Tracheal Aspirate; Respiratory  Result Value Ref Range Status   Specimen Description   Final    TRACHEAL ASPIRATE Performed at [facility], [address], [place], [place] [address]    Special Requests   Final    Normal Performed  at [facility], [address] [place], [place] [address]    Gram Stain   Final    FEW WBC PRESENT,BOTH PMN AND MONONUCLEAR NO ORGANISMS SEEN Performed at [facility], [place] [address] [place], [place] [address]    Culture PENDING  Incomplete   Report Status PENDING  Incomplete    Coagulation Studies: Recent Labs    08/05/19 0431 08/05/19 0906  LABPROT 17.3* 18.2*  INR 1.4* 1.5*    Urinalysis:  Recent Labs  Lab 08/05/19 2049  COLORURINE AMBER*  LABSPEC 1.023  PHURINE 6.0  GLUCOSEU NEGATIVE  HGBUR MODERATE*  BILIRUBINUR NEGATIVE  KETONESUR NEGATIVE  PROTEINUR 100*  NITRITE NEGATIVE  LEUKOCYTESUR SMALL*    Lipid Panel:     Component Value Date/Time   CHOL 112 07/10/2019 0630   TRIG 62 08/05/2019 0431   HDL 43 07/10/2019 0630   CHOLHDL 2.6 07/10/2019 0630   VLDL 14 07/10/2019 0630   LDLCALC 55 07/10/2019 0630    HgbA1C:  Lab Results  Component Value Date   HGBA1C 4.7 (L) 07/09/2019    Urine Drug Screen:      Component Value Date/Time   LABOPIA NONE DETECTED 08/05/2019 2049   COCAINSCRNUR NONE DETECTED 08/05/2019 2049   LABBENZ POSITIVE (A) 08/05/2019 2049   AMPHETMU NONE DETECTED 08/05/2019 2049   THCU POSITIVE (A) 08/05/2019 2049   LABBARB NONE DETECTED 08/05/2019 2049    Alcohol Level: No results for input(s): ETH in the last 168 hours.   Imaging: Dg Abd 1 View  Result Date: 08/06/2019 CLINICAL DATA:  Abdominal distension. EXAM: ABDOMEN - 1 VIEW COMPARISON:  None. FINDINGS: The bowel gas pattern is normal. No radio-opaque calculi or other significant radiographic abnormality are seen. Vascular stent noted. Surgical clips and rectal temperature probe are also seen. Defibrillator pad is in place. IMPRESSION: No acute finding. Electronically Signed   By: Inge Rise M.D.   On: 08/06/2019 11:16   US Venous Img Lower Bilateral  Result Date: 08/05/2019 CLINICAL DATA:  Bilateral lower extremity pain and edema. EXAM: BILATERAL  LOWER EXTREMITY VENOUS DOPPLER ULTRASOUND TECHNIQUE: Gray-scale sonography with graded compression, as well as color Doppler and duplex ultrasound were performed to evaluate the lower extremity deep venous systems from the level of the common femoral vein and including the common femoral, femoral, profunda femoral, popliteal and calf veins including the posterior tibial, peroneal and gastrocnemius veins when visible. The superficial great saphenous vein was also interrogated. Spectral Doppler was utilized to evaluate flow at rest and with distal augmentation maneuvers in the common femoral, femoral and popliteal veins. COMPARISON:  None. FINDINGS: RIGHT LOWER EXTREMITY Common Femoral Vein: No evidence of thrombus. Normal compressibility, respiratory phasicity and response to augmentation. Saphenofemoral Junction: No evidence of thrombus. Normal compressibility and flow on color Doppler imaging. Profunda Femoral Vein: No evidence of thrombus. Normal compressibility and flow on color Doppler imaging. Femoral Vein: No evidence of thrombus. Normal compressibility, respiratory phasicity and response to augmentation. Popliteal Vein: No evidence of thrombus. Normal compressibility, respiratory phasicity and response to augmentation. Calf Veins: No evidence of thrombus. Normal compressibility and flow on color Doppler imaging. Superficial Great Saphenous Vein: No evidence of thrombus. Normal compressibility. Venous Reflux:  None. Other Findings: No evidence of superficial thrombophlebitis or abnormal fluid collection. LEFT LOWER EXTREMITY Common Femoral Vein: No evidence of thrombus. Normal compressibility, respiratory phasicity and response to augmentation. Saphenofemoral Junction: No evidence of thrombus. Normal compressibility and flow on color Doppler imaging. Profunda Femoral Vein: No evidence of thrombus. Normal compressibility and flow on color Doppler imaging. Femoral Vein: No evidence of thrombus. Normal  compressibility, respiratory phasicity and response to augmentation. Popliteal Vein: No evidence of thrombus. Normal compressibility, respiratory  phasicity and response to augmentation. Calf Veins: No evidence of thrombus. Normal compressibility and flow on color Doppler imaging. Superficial Great Saphenous Vein: No evidence of thrombus. Normal compressibility. Venous Reflux:  None. Other Findings: No evidence of superficial thrombophlebitis or abnormal fluid collection. IMPRESSION: No evidence of deep venous thrombosis in either lower extremity. Electronically Signed   By: Aletta Edouard M.D.   On: 08/05/2019 14:00   Dg Chest Port 1 View  Result Date: 08/06/2019 CLINICAL DATA:  Acute respiratory failure EXAM: PORTABLE CHEST 1 VIEW COMPARISON:  Radiograph 08/05/2019 FINDINGS: Endotracheal tube in the mid trachea, 3 cm from the carina. Transesophageal tube side port is positioned at the GE junction and should be advanced 2-3 cm for optimal function. Dual lumen right IJ approach dialysis catheter tips terminate in the lower SVC. Pacer pads overlie the chest. Cardiac monitoring leads are present as well. Left axillary vascular stent is noted. Diffuse interstitial opacities again seen throughout the lungs. More dense opacities present in the left lung base. Small left effusion remains. No pneumothorax. IMPRESSION: 1. Endotracheal tube in the mid trachea, 3 cm from the carina. 2. Transesophageal tube side port is positioned at the GE junction and should be advanced 2-3 cm for optimal function. 3. Unchanged interstitial opacities throughout the lungs, which could reflect interstitial edema. 4. More dense opacity in the left lung base could reflect atelectasis, pneumonia or layering effusion. Electronically Signed   By: Lovena Le M.D.   On: 08/06/2019 05:49   Dg Abd Portable 1v  Result Date: 08/05/2019 CLINICAL DATA:  Nasal/orogastric tube placement. EXAM: PORTABLE ABDOMEN - 1 VIEW COMPARISON:  CT,  07/20/2019. FINDINGS: Nasal/orogastric tube passes below the diaphragm. Tip lies in the proximal stomach. Side hole lies in the expected location of the GE junction. Normal bowel gas pattern. Vascular stents in the upper abdomen centrally. IMPRESSION: 1. Nasal/orogastric tube tip in the proximal stomach. Side hole projects at the GE junction. 2. Normal bowel gas pattern. No evidence of a generalized adynamic ileus. No evidence of a bowel obstruction. Electronically Signed   By: Lajean Manes M.D.   On: 08/05/2019 13:59     Assessment/Plan: 43 y.o. male male with a known history end-stage renal disease on hemodialysis on Tuesday Thursday and Saturday, hypertension and peripheral arterial disease who presented to the emergency room with acute onset of dyspnea with associated dry cough and wheezing which have significantly worsened since this last morning. Patient is s/p cardiac arrest around 3 AM that lasted for about 10 minutes on hypothermia protocol now.  Patient was noted to have tongue twitching and fluttering of eyes that resolved with sedation.   - Con't Keppra 500 BID - EEG with diffuse slowing without any sharps. No seizures. Suspect some level of anoxia - extubation when possible - If unable to be off ventilator possibly repeat CTH

## 2019-08-07 NOTE — Progress Notes (Signed)
Updated pts father Cruse Heinzerling regarding pt condition and current plan of care all questions were answered.  Will continue to monitor and assess pt.  [name], [place] Pager [phone] (please enter 7 digits) PCCM Consult Pager [phone] (please enter 7 digits)

## 2019-08-07 NOTE — Progress Notes (Signed)
Name: Brett Small MRN: [id number] DOB: 1975/12/04     CONSULTATION DATE: 08/05/2019  CHIEF COMPLAINT:  Cardiac arrest  STUDIES/SIGNIFICANT EVENTS: 10/13 - admitted as telemetry patient 10/14 - cardiac arrest 10/14 - CT Head negative 10/14 - targeted temperature management initiated 10/14 - cardiology and neurology consulted; patient made DNR 10/15-purposeful movements noted on exam today when weaning sedation 10/16-patient has some purposeful movements, will perform weaning trial today    HISTORY OF PRESENT ILLNESS:   Brett Small is a 43 yo male with a past medical history of ESRD on hemodialysis, hypertension, peripheral vascular disease, and CHF with EF <20% who presented to the ED complaining of shortness of breath, dry cough, and wheezing. He was placed on BiPAP and admitted to the hospital for dialysis for fluid overload and flash pulmonary edema. Pt was then weaned off BiPAP.  On 10/14, pt became suddenly altered and then unresponsive with agonal respirations, leading to respiratory arrest with bradycardia and eventual PEA arrest requiring approximately 10 minutes of CPR/ACLS before ROSC was achieved. Pt was intubated during arrest. Post arrest, pt was still unresponsive with decorticate posturing to pain stimuli. He was placed on targeted temperature management to 36 C and placed on a heparin drip because of possibility of PE given significantly elevated D dimer.   PAST MEDICAL HISTORY :   has a past medical history of ESRD (end stage renal disease) ([place]), Hypertension, PAD (peripheral artery disease) ([place]), Peripheral vascular disease ([place]), Renal disorder, and Secondary hyperparathyroidism of renal origin ([place]).  has a past surgical history that includes Aorta - femoral artery bypass graft; AV fistula placement (Left, 12/26/2018); A/V SHUNT INTERVENTION (Left, 01/19/2019); DIALYSIS/PERMA CATHETER REMOVAL (N/A, 02/06/2019); A/V SHUNT INTERVENTION (Left, 03/02/2019); LEFT HEART  CATH AND CORONARY ANGIOGRAPHY (Right, 07/10/2019); and PERIPHERAL VASCULAR THROMBECTOMY (Left, 07/28/2019). Prior to Admission medications   Medication Sig Start Date End Date Taking? Authorizing Provider  amLODipine (NORVASC) 10 MG tablet Take 1 tablet (10 mg total) by mouth daily at 8 pm. 02/24/19  Yes Brett Lighter, MD  apixaban (ELIQUIS) 2.5 MG TABS tablet Take 1 tablet (2.5 mg total) by mouth 2 (two) times daily. 01/26/19  Yes Small, Brett Reno, MD  aspirin EC 81 MG tablet Take 1 tablet (81 mg total) by mouth daily. 01/26/19  Yes Small, Brett Reno, MD  atorvastatin (LIPITOR) 80 MG tablet Take 1 tablet (80 mg total) by mouth at bedtime. 01/26/19  Yes Small, Brett Reno, MD  b complex-C-folic acid 1 MG capsule Take 1 capsule by mouth daily after supper.    Yes [provider]  carvedilol (COREG) 25 MG tablet Take 1 tablet (25 mg total) by mouth 2 (two) times daily with a meal. 01/26/19  Yes Small, Brett Reno, MD  gabapentin (NEURONTIN) 100 MG capsule Take 1 capsule (100 mg total) by mouth 3 (three) times daily. 01/26/19  Yes Small, Brett Reno, MD  hydrALAZINE (APRESOLINE) 25 MG tablet Take 1 tablet (25 mg total) by mouth every 8 (eight) hours. 01/26/19  Yes Small, Brett Reno, MD  hydrOXYzine (ATARAX/VISTARIL) 25 MG tablet Take 25 mg by mouth 3 (three) times daily as needed for anxiety.   Yes [provider]  loratadine (CLARITIN) 10 MG tablet Take 1 tablet (10 mg total) by mouth daily. 03/04/19  Yes Small, Brett Peace, MD  multivitamin (RENA-VIT) TABS tablet Take 1 tablet by mouth daily. 01/27/19  Yes Small, Brett Reno, MD  neomycin-bacitracin-polymyxin (NEOSPORIN) OINT Apply 1 application topically 2 (two) times daily. 01/26/19  Yes  Small, Brett Reno, MD  Nutritional Supplements (FEEDING SUPPLEMENT, NEPRO CARB STEADY,) LIQD Take 237 mLs by mouth 2 (two) times daily between meals. 07/06/19  Yes Brett Lesches, MD  sevelamer carbonate (RENVELA) 800 MG tablet Take 2 tablets (1,600 mg total) by mouth 3 (three)  times daily with meals. 01/26/19  Yes Small, Brett Reno, MD   Allergies  Allergen Reactions  . Codeine Nausea Only    Patient questioned this (??)  . Sulfa Antibiotics Hives and Nausea And Vomiting    REVIEW OF SYSTEMS:   Unable to obtain due to critical illness   VITAL SIGNS: Temp:  [98.1 F (36.7 C)-98.6 F (37 C)] 98.4 F (36.9 C) (10/16 0900) Pulse Rate:  [64-92] 70 (10/16 0900) Resp:  [10-20] 20 (10/16 0900) BP: (96-123)/(54-85) 115/81 (10/16 0900) SpO2:  [99 %-100 %] 100 % (10/16 0900) FiO2 (%):  [24 %-30 %] 24 % (10/16 0759) Weight:  [49.7 kg-53.1 kg] 49.7 kg (10/16 0219)   I/O last 3 completed shifts: In: 1511.5 [I.V.:1011.5; IV Piggyback:500] Out: 4098 [Urine:155; Emesis/NG output:300; Other:868] No intake/output data recorded.   SpO2: 100 % O2 Flow Rate (L/min): 3 L/min FiO2 (%): 24 %  Vent Mode: PRVC FiO2 (%):  [24 %-30 %] 24 % Set Rate:  [20 bmp] 20 bmp Vt Set:  [500 mL] 500 mL PEEP:  [5 cmH20] 5 cmH20 Plateau Pressure:  [19 cmH20] 19 cmH20   Physical Examination:  GENERAL: Chronically ill appearing, intubated and sedated HEAD: Normocephalic, atraumatic.  EYES: Fixed pupils of grossly different sizes. No scleral icterus.  MOUTH: Moist mucosal membrane. NECK: Supple. No JVD.  PULMONARY: lungs clear to auscultation CARDIOVASCULAR: S1 and S2. Regular rate and rhythm. No murmurs, rubs, or gallops.  GASTROINTESTINAL: Soft, nontender, -distended. No masses. Positive bowel sounds. No hepatosplenomegaly.  MUSCULOSKELETAL: No swelling, clubbing, or edema.  NEUROLOGIC: Sedated on mechanical ventilation SKIN:intact,warm,dry  I personally reviewed lab work that was obtained in last 24 hrs. CXR Independently reviewed  MEDICATIONS: I have reviewed all medications and confirmed regimen as documented   CULTURE RESULTS   Recent Results (from the past 240 hour(s))  SARS Coronavirus 2 by RT PCR (hospital order, performed in [facility] hospital lab)  Nasopharyngeal Nasopharyngeal Swab     Status: None   Collection Time: 08/03/19  9:25 AM   Specimen: Nasopharyngeal Swab  Result Value Ref Range Status   SARS Coronavirus 2 NEGATIVE NEGATIVE Final    Comment: (NOTE) If result is NEGATIVE SARS-CoV-2 target nucleic acids are NOT DETECTED. The SARS-CoV-2 RNA is generally detectable in upper and lower  respiratory specimens during the acute phase of infection. The lowest  concentration of SARS-CoV-2 viral copies this assay can detect is 250  copies / mL. A negative result does not preclude SARS-CoV-2 infection  and should not be used as the sole basis for treatment or other  patient management decisions.  A negative result may occur with  improper specimen collection / handling, submission of specimen other  than nasopharyngeal swab, presence of viral mutation(s) within the  areas targeted by this assay, and inadequate number of viral copies  (<250 copies / mL). A negative result must be combined with clinical  observations, patient history, and epidemiological information. If result is POSITIVE SARS-CoV-2 target nucleic acids are DETECTED. The SARS-CoV-2 RNA is generally detectable in upper and lower  respiratory specimens dur ing the acute phase of infection.  Positive  results are indicative of active infection with SARS-CoV-2.  Clinical  correlation with patient  history and other diagnostic information is  necessary to determine patient infection status.  Positive results do  not rule out bacterial infection or co-infection with other viruses. If result is PRESUMPTIVE POSTIVE SARS-CoV-2 nucleic acids MAY BE PRESENT.   A presumptive positive result was obtained on the submitted specimen  and confirmed on repeat testing.  While 2019 novel coronavirus  (SARS-CoV-2) nucleic acids may be present in the submitted sample  additional confirmatory testing may be necessary for epidemiological  and / or clinical management purposes  to  differentiate between  SARS-CoV-2 and other Sarbecovirus currently known to infect humans.  If clinically indicated additional testing with an alternate test  methodology [phone]) is advised. The SARS-CoV-2 RNA is generally  detectable in upper and lower respiratory sp ecimens during the acute  phase of infection. The expected result is Negative. Fact Sheet for Patients:  StrictlyIdeas.no Fact Sheet for Healthcare Providers: BankingDealers.co.za This test is not yet approved or cleared by the [place] FDA and has been authorized for detection and/or diagnosis of SARS-CoV-2 by FDA under an Emergency Use Authorization (EUA).  This EUA will remain in effect (meaning this test can be used) for the duration of the COVID-19 declaration under Section 564(b)(1) of the Act, 21 U.S.C. section 360bbb-3(b)(1), unless the authorization is terminated or revoked sooner. Performed at [facility], [address] [place], [place] [address]   Blood culture (routine x 2)     Status: None (Preliminary result)   Collection Time: 08/03/19 10:05 AM   Specimen: BLOOD  Result Value Ref Range Status   Specimen Description BLOOD BLOOD RIGHT FOREARM  Final   Special Requests   Final    BOTTLES DRAWN AEROBIC AND ANAEROBIC Blood Culture adequate volume   Culture   Final    NO GROWTH 4 DAYS Performed at [facility], [address], [place], [place] [address]    Report Status PENDING  Incomplete  MRSA PCR Screening     Status: None   Collection Time: 08/04/19  7:56 AM   Specimen: Nasopharyngeal  Result Value Ref Range Status   MRSA by PCR NEGATIVE NEGATIVE Final    Comment:        The GeneXpert MRSA Assay (FDA approved for NASAL specimens only), is one component of a comprehensive MRSA colonization surveillance program. It is not intended to diagnose MRSA infection nor to guide or monitor treatment for MRSA  infections. Performed at [facility], [address] [place], [place] [address]   Urine Culture     Status: None   Collection Time: 08/05/19 10:14 AM   Specimen: Urine, Random  Result Value Ref Range Status   Specimen Description   Final    URINE, RANDOM Performed at [facility], [address], [place], [place] [address]    Special Requests   Final    NONE Performed at [facility], [address] [place], [place] [address]    Culture   Final    NO GROWTH Performed at [facility], [place] [address], [place], [place] [address]    Report Status 08/06/2019 FINAL  Final  Culture, respiratory (non-expectorated)     Status: None (Preliminary result)   Collection Time: 08/05/19  8:40 PM   Specimen: Tracheal Aspirate; Respiratory  Result Value Ref Range Status   Specimen Description   Final    TRACHEAL ASPIRATE Performed at [facility], [address] [place], [place] [address]    Special Requests  Final    Normal Performed at [facility], [place], [place] [address]    Gram Stain   Final    FEW WBC PRESENT,BOTH PMN AND MONONUCLEAR NO ORGANISMS SEEN Performed at [facility], [place] [address], [place], [place] [address]    Culture PENDING  Incomplete   Report Status PENDING  Incomplete          IMAGING    No results found.      Indwelling Urinary Catheter continued, requirement due to   Reason to continue Indwelling Urinary Catheter strict Intake/Output monitoring for hemodynamic instability   Central Line/ continued, requirement due to  Reason to continue [place] of central venous pressure or other hemodynamic parameters and poor IV access   Ventilator continued, requirement due to severe respiratory failure   Ventilator Sedation RASS 0 to -2     ASSESSMENT AND PLAN  43 yo male with ESRD on HD and CHF with EF <20% admitted to the ICU with acute hypoxic  respiratory distress secondary to flash pulmonary edema from CHF exacerbation. Pt had respiratory and eventual cardiac arrest in ICU with PEA that required ACLS resuscitation secondary to differential of PE vs CHF with volume overload vs pneumonia.  Severe ACUTE Hypoxic and Hypercapnic Respiratory Failure Post cardiac arrest  -continue Full MV support -continue Bronchodilator Therapy -Wean Fio2 and PEEP as tolerated -will perform SAT/SBT when respiratory parameters are met -possible acute venous pulmonary thromboembolism  Cardiac arrest, with cardiogenic shock ROSC achieved; pt now DNR - TTM at [facility]- cooling is over - will start rewarming and waking up  - maintain MAP >65 using vasopressors - Repeat echo pending - follow up cardiology recs  ACUTE on [place]- EF <20% -oxygen as needed -Lasix as tolerated -follow up cardiac enzymes as indicated -follow up cardiology recs  End stage renal disease on HD -follow BMP -follow UO -continue Foley Catheter-assess need -Avoid nephrotoxic agents -Nephrology consulted, follow up on recs  NEUROLOGY - intubated and sedated -minimize sedation to optimize for SBT and better neuro eval  - neurology on case - Dr Irish Elders - appreciate input Wake up assessment - localizes off sedation  CARDIAC ICU monitoring  ID -continue IV abx as prescibed -follow up cultures  GI GI PROPHYLAXIS as indicated  NUTRITIONAL STATUS DIET-->TF's as tolerated Constipation protocol as indicated  ENDO - will use ICU hypoglycemic\Hyperglycemia protocol if needed  ELECTROLYTES -follow labs as needed -replace as needed -pharmacy consultation and following  DVT/GI PRX ordered TRANSFUSIONS AS NEEDED MONITOR FSBS ASSESS the need for LABS      Ottie Glazier, M.D.  [facility] [place]    Critical care provider statement:    Critical care time (minutes):  33   Critical care time was  exclusive of:  Separately billable procedures and  treating other patients   Critical care was necessary to treat or prevent imminent or  life-threatening deterioration of the following conditions:   Cardiac arrest, acute hypoxemic respiratory failure, multiple comorbid conditions   Critical care was time spent personally by me on the following  activities:  Development of treatment plan with patient or surrogate,  discussions with consultants, evaluation of patient's response to  treatment, examination of patient, obtaining history from patient or  surrogate, ordering and performing treatments and interventions, ordering  and review of laboratory studies and re-evaluation of patient's condition   I assumed direction of critical care  for this patient from another  provider in my specialty: no

## 2019-08-07 NOTE — Progress Notes (Signed)
[facility]  ROUNDING NOTE   Subjective:  Patient remains critically ill. However he is following commands. Now extubated.   Objective:  Vital signs in last 24 hours:  Temp:  [98.2 F (36.8 C)-98.8 F (37.1 C)] 98.4 F (36.9 C) (10/16 1400) Pulse Rate:  [64-95] 92 (10/16 1400) Resp:  [10-20] 19 (10/16 1400) BP: (100-125)/(62-85) 116/83 (10/16 1400) SpO2:  [94 %-100 %] 97 % (10/16 1400) FiO2 (%):  [24 %-30 %] 24 % (10/16 0759) Weight:  [49.7 kg-51.8 kg] 49.7 kg (10/16 0219)  Weight change: 3.6 kg Filed Weights   08/06/19 1250 08/06/19 1600 08/07/19 0219  Weight: 53.1 kg 51.8 kg 49.7 kg    Intake/Output: I/O last 3 completed shifts: In: 1511.5 [I.V.:1011.5; IV Piggyback:500] Out: 9450 [Urine:155; Emesis/NG output:300; Other:868]   Intake/Output this shift:  No intake/output data recorded.  Physical Exam: General: Critically ill-appearing  Head: [place]/AT hearing intact  Eyes: Anicteric  Neck: Supple, trachea midline  Lungs:  Clear to auscultation, normal effort  Heart: S1S2 no rubs  Abdomen:  Soft, nontender, bowel sounds present  Extremities: No peripheral edema.  Neurologic: Will follow simple commands  Skin: No lesions  Access: LUE AVF, Right IJ PC    Basic Metabolic Panel: Recent Labs  Lab 08/04/19 0845  08/05/19 0906 08/05/19 1819 08/06/19 0422 08/06/19 1124 08/06/19 1958 08/07/19 0312  NA 139   < > 137 137 138  --  135 137  K 3.4*   < > 5.3* 4.7 4.8  --  3.6 3.8  CL 99   < > 100 100 103  --  99 102  CO2 26   < > 23 22 19*  --  26 27  GLUCOSE 82   < > 129* 98 112*  --  138* 129*  BUN 18   < > 23* 30* 36*  --  17 22*  CREATININE 2.86*   < > 2.87* 3.44* 3.65*  --  2.16* 2.57*  CALCIUM 8.3*   < > 9.1 9.1 8.7*  --  8.2* 8.4*  MG  --   --   --   --   --   --  1.6* 1.7  PHOS 3.9  --   --   --   --  9.1*  --   --    < > = values in this interval not displayed.    Liver Function Tests: Recent Labs  Lab 08/03/19 0925 08/04/19 0845  08/05/19 0431 08/06/19 0422  AST 28  --  117* 79*  ALT 11  --  67* 66*  ALKPHOS 73  --  94 75  BILITOT 1.5*  --  1.2 1.7*  PROT 6.4*  --  6.2* 5.8*  ALBUMIN 2.9* 2.7* 2.6* 2.4*   No results for input(s): LIPASE, AMYLASE in the last 168 hours. No results for input(s): AMMONIA in the last 168 hours.  CBC: Recent Labs  Lab 08/04/19 0349 08/05/19 0431 08/05/19 1132 08/05/19 1819 08/06/19 0422 08/06/19 1958 08/07/19 0312  WBC 10.1 12.3* 12.1* 8.0 6.9 8.2 7.4  NEUTROABS 8.0* 10.7* 9.9* 7.1  --  7.0  --   HGB 9.2* 8.2* 8.2* 8.3* 8.0* 7.4* 7.7*  HCT 28.8* 26.8* 26.3* 26.3* 25.0* 22.8* 23.5*  MCV 100.0 104.3* 102.7* 102.3* 99.6 97.9 97.1  PLT 150 135* 134* 122* 116* 124* 122*    Cardiac Enzymes: No results for input(s): CKTOTAL, CKMB, CKMBINDEX, TROPONINI in the last 168 hours.  BNP: Invalid input(s): POCBNP  CBG: Recent  Labs  Lab 08/06/19 2000 08/06/19 2329 08/07/19 0322 08/07/19 0745 08/07/19 1233  GLUCAP 135* 110* 132* 123* 109*    Microbiology: Results for orders placed or performed during the hospital encounter of 08/03/19  SARS Coronavirus 2 by RT PCR (hospital order, performed in [facility] hospital lab) Nasopharyngeal Nasopharyngeal Swab     Status: None   Collection Time: 08/03/19  9:25 AM   Specimen: Nasopharyngeal Swab  Result Value Ref Range Status   SARS Coronavirus 2 NEGATIVE NEGATIVE Final    Comment: (NOTE) If result is NEGATIVE SARS-CoV-2 target nucleic acids are NOT DETECTED. The SARS-CoV-2 RNA is generally detectable in upper and lower  respiratory specimens during the acute phase of infection. The lowest  concentration of SARS-CoV-2 viral copies this assay can detect is 250  copies / mL. A negative result does not preclude SARS-CoV-2 infection  and should not be used as the sole basis for treatment or other  patient management decisions.  A negative result may occur with  improper specimen collection / handling, submission of specimen other   than nasopharyngeal swab, presence of viral mutation(s) within the  areas targeted by this assay, and inadequate number of viral copies  (<250 copies / mL). A negative result must be combined with clinical  observations, patient history, and epidemiological information. If result is POSITIVE SARS-CoV-2 target nucleic acids are DETECTED. The SARS-CoV-2 RNA is generally detectable in upper and lower  respiratory specimens dur ing the acute phase of infection.  Positive  results are indicative of active infection with SARS-CoV-2.  Clinical  correlation with patient history and other diagnostic information is  necessary to determine patient infection status.  Positive results do  not rule out bacterial infection or co-infection with other viruses. If result is PRESUMPTIVE POSTIVE SARS-CoV-2 nucleic acids MAY BE PRESENT.   A presumptive positive result was obtained on the submitted specimen  and confirmed on repeat testing.  While 2019 novel coronavirus  (SARS-CoV-2) nucleic acids may be present in the submitted sample  additional confirmatory testing may be necessary for epidemiological  and / or clinical management purposes  to differentiate between  SARS-CoV-2 and other Sarbecovirus currently known to infect humans.  If clinically indicated additional testing with an alternate test  methodology [phone]) is advised. The SARS-CoV-2 RNA is generally  detectable in upper and lower respiratory sp ecimens during the acute  phase of infection. The expected result is Negative. Fact Sheet for Patients:  StrictlyIdeas.no Fact Sheet for Healthcare Providers: BankingDealers.co.za This test is not yet approved or cleared by the [place] FDA and has been authorized for detection and/or diagnosis of SARS-CoV-2 by FDA under an Emergency Use Authorization (EUA).  This EUA will remain in effect (meaning this test can be used) for the duration of  the COVID-19 declaration under Section 564(b)(1) of the Act, 21 U.S.C. section 360bbb-3(b)(1), unless the authorization is terminated or revoked sooner. Performed at [facility], [address] [place], [place] [address]   Blood culture (routine x 2)     Status: None (Preliminary result)   Collection Time: 08/03/19 10:05 AM   Specimen: BLOOD  Result Value Ref Range Status   Specimen Description BLOOD BLOOD RIGHT FOREARM  Final   Special Requests   Final    BOTTLES DRAWN AEROBIC AND ANAEROBIC Blood Culture adequate volume   Culture   Final    NO GROWTH 4 DAYS Performed at [facility], [address], [place],  [address]  Report Status PENDING  Incomplete  MRSA PCR Screening     Status: None   Collection Time: 08/04/19  7:56 AM   Specimen: Nasopharyngeal  Result Value Ref Range Status   MRSA by PCR NEGATIVE NEGATIVE Final    Comment:        The GeneXpert MRSA Assay (FDA approved for NASAL specimens only), is one component of a comprehensive MRSA colonization surveillance program. It is not intended to diagnose MRSA infection nor to guide or monitor treatment for MRSA infections. Performed at [facility], [address], [place], [place] [address]   Urine Culture     Status: None   Collection Time: 08/05/19 10:14 AM   Specimen: Urine, Random  Result Value Ref Range Status   Specimen Description   Final    URINE, RANDOM Performed at [facility], [address] [place], [place] [address]    Special Requests   Final    NONE Performed at [facility], [address] [place], [place] [address]    Culture   Final    NO GROWTH Performed at [facility], [place] [address] [place], [place] [address]    Report Status 08/06/2019 FINAL  Final  Culture, respiratory (non-expectorated)     Status: None (Preliminary result)   Collection Time: 08/05/19  8:40 PM   Specimen: Tracheal Aspirate; Respiratory  Result  Value Ref Range Status   Specimen Description   Final    TRACHEAL ASPIRATE Performed at [facility], [address] [place], [place] [address]    Special Requests   Final    Normal Performed at [facility], [address] [place], [place] [address]    Gram Stain   Final    FEW WBC PRESENT,BOTH PMN AND MONONUCLEAR NO ORGANISMS SEEN    Culture   Final    CULTURE REINCUBATED FOR BETTER GROWTH Performed at [facility], [place] [address], [place], [place] [address]    Report Status PENDING  Incomplete    Coagulation Studies: Recent Labs    08/05/19 0431 08/05/19 0906  LABPROT 17.3* 18.2*  INR 1.4* 1.5*    Urinalysis: Recent Labs    08/05/19 2049  COLORURINE AMBER*  LABSPEC 1.023  PHURINE 6.0  GLUCOSEU NEGATIVE  HGBUR MODERATE*  BILIRUBINUR NEGATIVE  KETONESUR NEGATIVE  PROTEINUR 100*  NITRITE NEGATIVE  LEUKOCYTESUR SMALL*      Imaging: Dg Abd 1 View  Result Date: 08/06/2019 CLINICAL DATA:  Abdominal distension. EXAM: ABDOMEN - 1 VIEW COMPARISON:  None. FINDINGS: The bowel gas pattern is normal. No radio-opaque calculi or other significant radiographic abnormality are seen. Vascular stent noted. Surgical clips and rectal temperature probe are also seen. Defibrillator pad is in place. IMPRESSION: No acute finding. Electronically Signed   By: Inge Rise M.D.   On: 08/06/2019 11:16   Dg Chest Port 1 View  Result Date: 08/06/2019 CLINICAL DATA:  Acute respiratory failure EXAM: PORTABLE CHEST 1 VIEW COMPARISON:  Radiograph 08/05/2019 FINDINGS: Endotracheal tube in the mid trachea, 3 cm from the carina. Transesophageal tube side port is positioned at the GE junction and should be advanced 2-3 cm for optimal function. Dual lumen right IJ approach dialysis catheter tips terminate in the lower SVC. Pacer pads overlie the chest. Cardiac monitoring leads are present as well. Left axillary vascular stent is noted. Diffuse interstitial opacities  again seen throughout the lungs. More dense opacities present in the left lung base. Small left effusion remains.  No pneumothorax. IMPRESSION: 1. Endotracheal tube in the mid trachea, 3 cm from the carina. 2. Transesophageal tube side port is positioned at the GE junction and should be advanced 2-3 cm for optimal function. 3. Unchanged interstitial opacities throughout the lungs, which could reflect interstitial edema. 4. More dense opacity in the left lung base could reflect atelectasis, pneumonia or layering effusion. Electronically Signed   By: Lovena Le M.D.   On: 08/06/2019 05:49     Medications:   . sodium chloride    . sodium chloride    . sodium chloride    . sodium chloride    . ceFEPime (MAXIPIME) IV Stopped (08/06/19 1740)  . famotidine (PEPCID) IV Stopped (08/06/19 1840)  . heparin 800 Units/hr (08/07/19 0601)  . levETIRAcetam 500 mg (08/07/19 0852)  . norepinephrine (LEVOPHED) Adult infusion 10.667 mcg/min (08/07/19 0400)  . potassium chloride     . aspirin  81 mg Per Tube Daily  . chlorhexidine gluconate (MEDLINE KIT)  15 mL Mouth Rinse BID  . Chlorhexidine Gluconate Cloth  6 each Topical Q0600  . [START ON 08/08/2019] epoetin (EPOGEN/PROCRIT) injection  10,000 Units Intravenous Q T,Th,Sa-HD  . hydrocortisone sod succinate (SOLU-CORTEF) inj  100 mg Intravenous Q12H  . insulin aspart  0-9 Units Subcutaneous Q4H  . mouth rinse  15 mL Mouth Rinse 10 times per day  . pantoprazole (PROTONIX) IV  40 mg Intravenous QHS  . sodium chloride flush  3 mL Intravenous Q12H   sodium chloride, sodium chloride, sodium chloride, acetaminophen, albuterol, alteplase, heparin, labetalol, lidocaine (PF), lidocaine-prilocaine, nitroGLYCERIN, ondansetron (ZOFRAN) IV, pentafluoroprop-tetrafluoroeth, promethazine, sodium chloride flush  Assessment/ Plan:  44 y.o. male with end-stage renal disease on hemodialysis, hypertension, peripheral vascular disease, depression.  [place]  TTS    1.  ESRD on HD TTS.  Patient due for dialysis again tomorrow.  We will prepare orders.  2.  Acute respiratory failure/PEA arrest.  Patient successfully weaned from the ventilator and currently following commands.  This is a good sign.  Continue to monitor for recovery..  3.  Anemia of chronic kidney disease.  We will start the patient Epogen with a next dialysis treatment.  4.  Secondary hyperparathyroidism.  Last phosphorus was quite high at 9.1.  We plan to repeat this tomorrow during dialysis treatment.   LOS: 3 Maleta Pacha 10/16/20203:03 PM

## 2019-08-07 NOTE — Progress Notes (Signed)
Patient ID: Brett Small, male   DOB: 1976-09-18, 43 y.o.   MRN: [id number]  [facility] PROGRESS NOTE  Indalecio Malmstrom ERX:[id number] DOB: 1976-01-22 DOA: 08/03/2019 PCP: Patient, [name]  HPI/Subjective: Patient seen after extubation.  Did not complain of any shortness of breath or chest pain.  Was able to move all of his extremities.  Objective: Vitals:   08/07/19 1200 08/07/19 1300  BP: 125/79 112/83  Pulse: 87 95  Resp: 13 10  Temp: 98.8 F (37.1 C)   SpO2: 98% 94%    Intake/Output Summary (Last 24 hours) at 08/07/2019 1310 Last data filed at 08/07/2019 0400 Gross per 24 hour  Intake 812.95 ml  Output 1033 ml  Net -220.05 ml   Filed Weights   08/06/19 1250 08/06/19 1600 08/07/19 0219  Weight: 53.1 kg 51.8 kg 49.7 kg    ROS: Review of Systems  Unable to perform ROS: Acuity of condition  Respiratory: Negative for shortness of breath.   Cardiovascular: Negative for chest pain.  Gastrointestinal: Negative for abdominal pain.   Exam: Physical Exam  HENT:  Mouth/Throat: No oropharyngeal exudate.  Unable to look on the mouth  Eyes: Pupils are equal, round, and reactive to light. Conjunctivae and lids are normal.  Neck: Trachea normal. Carotid bruit is not present.  Cardiovascular: Regular rhythm, S1 normal, S2 normal and normal heart sounds.  Respiratory: He has decreased breath sounds in the right lower field and the left lower field. He has no wheezes. He has rhonchi in the right lower field and the left lower field. He has no rales.  GI: Soft. Bowel sounds are normal. There is no abdominal tenderness.  Musculoskeletal:     Right ankle: He exhibits no swelling.     Left ankle: He exhibits no swelling.  Neurological: He is alert.  Follows commands and able to move all extremities.  Skin: Skin is warm. No rash noted. Nails show no clubbing.  Psychiatric: He has a normal mood and affect.      Data Reviewed: Basic Metabolic Panel: Recent Labs  Lab  08/04/19 0845  08/05/19 0906 08/05/19 1819 08/06/19 0422 08/06/19 1124 08/06/19 1958 08/07/19 0312  NA 139   < > 137 137 138  --  135 137  K 3.4*   < > 5.3* 4.7 4.8  --  3.6 3.8  CL 99   < > 100 100 103  --  99 102  CO2 26   < > 23 22 19*  --  26 27  GLUCOSE 82   < > 129* 98 112*  --  138* 129*  BUN 18   < > 23* 30* 36*  --  17 22*  CREATININE 2.86*   < > 2.87* 3.44* 3.65*  --  2.16* 2.57*  CALCIUM 8.3*   < > 9.1 9.1 8.7*  --  8.2* 8.4*  MG  --   --   --   --   --   --  1.6* 1.7  PHOS 3.9  --   --   --   --  9.1*  --   --    < > = values in this interval not displayed.   Liver Function Tests: Recent Labs  Lab 08/03/19 0925 08/04/19 0845 08/05/19 0431 08/06/19 0422  AST 28  --  117* 79*  ALT 11  --  67* 66*  ALKPHOS 73  --  94 75  BILITOT 1.5*  --  1.2 1.7*  PROT 6.4*  --  6.2* 5.8*  ALBUMIN 2.9* 2.7* 2.6* 2.4*   CBC: Recent Labs  Lab 08/04/19 0349 08/05/19 0431 08/05/19 1132 08/05/19 1819 08/06/19 0422 08/06/19 1958 08/07/19 0312  WBC 10.1 12.3* 12.1* 8.0 6.9 8.2 7.4  NEUTROABS 8.0* 10.7* 9.9* 7.1  --  7.0  --   HGB 9.2* 8.2* 8.2* 8.3* 8.0* 7.4* 7.7*  HCT 28.8* 26.8* 26.3* 26.3* 25.0* 22.8* 23.5*  MCV 100.0 104.3* 102.7* 102.3* 99.6 97.9 97.1  PLT 150 135* 134* 122* 116* 124* 122*   BNP (last 3 results) Recent Labs    07/20/19 1029 08/03/19 0925 08/05/19 0431  BNP >4,500.0* >4,500.0* 3,772.0*     CBG: Recent Labs  Lab 08/06/19 2000 08/06/19 2329 08/07/19 0322 08/07/19 0745 08/07/19 1233  GLUCAP 135* 110* 132* 123* 109*    Recent Results (from the past 240 hour(s))  SARS Coronavirus 2 by RT PCR (hospital order, performed in [facility] hospital lab) Nasopharyngeal Nasopharyngeal Swab     Status: None   Collection Time: 08/03/19  9:25 AM   Specimen: Nasopharyngeal Swab  Result Value Ref Range Status   SARS Coronavirus 2 NEGATIVE NEGATIVE Final    Comment: (NOTE) If result is NEGATIVE SARS-CoV-2 target nucleic acids are NOT DETECTED. The  SARS-CoV-2 RNA is generally detectable in upper and lower  respiratory specimens during the acute phase of infection. The lowest  concentration of SARS-CoV-2 viral copies this assay can detect is 250  copies / mL. A negative result does not preclude SARS-CoV-2 infection  and should not be used as the sole basis for treatment or other  patient management decisions.  A negative result may occur with  improper specimen collection / handling, submission of specimen other  than nasopharyngeal swab, presence of viral mutation(s) within the  areas targeted by this assay, and inadequate number of viral copies  (<250 copies / mL). A negative result must be combined with clinical  observations, patient history, and epidemiological information. If result is POSITIVE SARS-CoV-2 target nucleic acids are DETECTED. The SARS-CoV-2 RNA is generally detectable in upper and lower  respiratory specimens dur ing the acute phase of infection.  Positive  results are indicative of active infection with SARS-CoV-2.  Clinical  correlation with patient history and other diagnostic information is  necessary to determine patient infection status.  Positive results do  not rule out bacterial infection or co-infection with other viruses. If result is PRESUMPTIVE POSTIVE SARS-CoV-2 nucleic acids MAY BE PRESENT.   A presumptive positive result was obtained on the submitted specimen  and confirmed on repeat testing.  While 2019 novel coronavirus  (SARS-CoV-2) nucleic acids may be present in the submitted sample  additional confirmatory testing may be necessary for epidemiological  and / or clinical management purposes  to differentiate between  SARS-CoV-2 and other Sarbecovirus currently known to infect humans.  If clinically indicated additional testing with an alternate test  methodology [phone]) is advised. The SARS-CoV-2 RNA is generally  detectable in upper and lower respiratory sp ecimens during the acute   phase of infection. The expected result is Negative. Fact Sheet for Patients:  StrictlyIdeas.no Fact Sheet for Healthcare Providers: BankingDealers.co.za This test is not yet approved or cleared by the [place] FDA and has been authorized for detection and/or diagnosis of SARS-CoV-2 by FDA under an Emergency Use Authorization (EUA).  This EUA will remain in effect (meaning this test can be used) for the duration of the COVID-19 declaration under Section 564(b)(1) of the Act, 21 U.S.C. section 360bbb-3(b)(1),  unless the authorization is terminated or revoked sooner. Performed at [facility], [address] [place], [place] [address]   Blood culture (routine x 2)     Status: None (Preliminary result)   Collection Time: 08/03/19 10:05 AM   Specimen: BLOOD  Result Value Ref Range Status   Specimen Description BLOOD BLOOD RIGHT FOREARM  Final   Special Requests   Final    BOTTLES DRAWN AEROBIC AND ANAEROBIC Blood Culture adequate volume   Culture   Final    NO GROWTH 4 DAYS Performed at [facility], [address] [place], [place] [address]    Report Status PENDING  Incomplete  MRSA PCR Screening     Status: None   Collection Time: 08/04/19  7:56 AM   Specimen: Nasopharyngeal  Result Value Ref Range Status   MRSA by PCR NEGATIVE NEGATIVE Final    Comment:        The GeneXpert MRSA Assay (FDA approved for NASAL specimens only), is one component of a comprehensive MRSA colonization surveillance program. It is not intended to diagnose MRSA infection nor to guide or monitor treatment for MRSA infections. Performed at [facility], [address] [place], [place] [address]   Urine Culture     Status: None   Collection Time: 08/05/19 10:14 AM   Specimen: Urine, Random  Result Value Ref Range Status   Specimen Description   Final    URINE, RANDOM Performed at [facility], [address] [place], [place] [address]    Special Requests   Final    NONE Performed at [facility], [address], [place], [place] [address]    Culture   Final    NO GROWTH Performed at [facility], [place] [address], [place], [place] [address]    Report Status 08/06/2019 FINAL  Final  Culture, respiratory (non-expectorated)     Status: None (Preliminary result)   Collection Time: 08/05/19  8:40 PM   Specimen: Tracheal Aspirate; Respiratory  Result Value Ref Range Status   Specimen Description   Final    TRACHEAL ASPIRATE Performed at [facility], [address] [place], [place] [address]    Special Requests   Final    Normal Performed at [facility], [address] [place], [place] [address]    Gram Stain   Final    FEW WBC PRESENT,BOTH PMN AND MONONUCLEAR NO ORGANISMS SEEN    Culture   Final    CULTURE REINCUBATED FOR BETTER GROWTH Performed at [facility], [place] [address], [place], [place] [address]    Report Status PENDING  Incomplete     Studies: Dg Abd 1 View  Result Date: 08/06/2019 CLINICAL DATA:  Abdominal distension. EXAM: ABDOMEN - 1 VIEW COMPARISON:  None. FINDINGS: The bowel gas pattern is normal. No radio-opaque calculi or other significant radiographic abnormality are seen. Vascular stent noted. Surgical clips and rectal temperature probe are also seen. Defibrillator pad is in place. IMPRESSION: No acute finding. Electronically Signed   By: Inge Rise M.D.   On: 08/06/2019 11:16   US Venous Img Lower Bilateral  Result Date: 08/05/2019 CLINICAL DATA:  Bilateral lower extremity pain and edema. EXAM: BILATERAL LOWER EXTREMITY VENOUS DOPPLER ULTRASOUND TECHNIQUE: Gray-scale sonography with graded compression, as well as color Doppler and duplex ultrasound were performed to evaluate the lower extremity deep venous systems from the level of the common femoral vein and including the common femoral, femoral,  profunda femoral, popliteal and calf veins including the posterior tibial, peroneal and gastrocnemius veins when visible. The superficial great saphenous vein was also interrogated. Spectral Doppler was utilized to evaluate flow at rest and with distal augmentation maneuvers in the common femoral, femoral and popliteal veins. COMPARISON:  None. FINDINGS: RIGHT LOWER EXTREMITY Common Femoral Vein: No evidence of thrombus. Normal compressibility, respiratory phasicity and response to augmentation. Saphenofemoral Junction: No evidence of thrombus. Normal compressibility and flow on color Doppler imaging. Profunda Femoral Vein: No evidence of thrombus. Normal compressibility and flow on color Doppler imaging. Femoral Vein: No evidence of thrombus. Normal compressibility, respiratory phasicity and response to augmentation. Popliteal Vein: No evidence of thrombus. Normal compressibility, respiratory phasicity and response to augmentation. Calf Veins: No evidence of thrombus. Normal compressibility and flow on color Doppler imaging. Superficial Great Saphenous Vein: No evidence of thrombus. Normal compressibility. Venous Reflux:  None. Other Findings: No evidence of superficial thrombophlebitis or abnormal fluid collection. LEFT LOWER EXTREMITY Common Femoral Vein: No evidence of thrombus. Normal compressibility, respiratory phasicity and response to augmentation. Saphenofemoral Junction: No evidence of thrombus. Normal compressibility and flow on color Doppler imaging. Profunda Femoral Vein: No evidence of thrombus. Normal compressibility and flow on color Doppler imaging. Femoral Vein: No evidence of thrombus. Normal compressibility, respiratory phasicity and response to augmentation. Popliteal Vein: No evidence of thrombus. Normal compressibility, respiratory phasicity and response to augmentation. Calf Veins: No evidence of thrombus. Normal compressibility and flow on color Doppler imaging. Superficial Great Saphenous  Vein: No evidence of thrombus. Normal compressibility. Venous Reflux:  None. Other Findings: No evidence of superficial thrombophlebitis or abnormal fluid collection. IMPRESSION: No evidence of deep venous thrombosis in either lower extremity. Electronically Signed   By: Aletta Edouard M.D.   On: 08/05/2019 14:00   Dg Chest Port 1 View  Result Date: 08/06/2019 CLINICAL DATA:  Acute respiratory failure EXAM: PORTABLE CHEST 1 VIEW COMPARISON:  Radiograph 08/05/2019 FINDINGS: Endotracheal tube in the mid trachea, 3 cm from the carina. Transesophageal tube side port is positioned at the GE junction and should be advanced 2-3 cm for optimal function. Dual lumen right IJ approach dialysis catheter tips terminate in the lower SVC. Pacer pads overlie the chest. Cardiac monitoring leads are present as well. Left axillary vascular stent is noted. Diffuse interstitial opacities again seen throughout the lungs. More dense opacities present in the left lung base. Small left effusion remains. No pneumothorax. IMPRESSION: 1. Endotracheal tube in the mid trachea, 3 cm from the carina. 2. Transesophageal tube side port is positioned at the GE junction and should be advanced 2-3 cm for optimal function. 3. Unchanged interstitial opacities throughout the lungs, which could reflect interstitial edema. 4. More dense opacity in the left lung base could reflect atelectasis, pneumonia or layering effusion. Electronically Signed   By: Lovena Le M.D.   On: 08/06/2019 05:49    Scheduled Meds: . aspirin  81 mg Per Tube Daily  . chlorhexidine gluconate (MEDLINE KIT)  15 mL Mouth Rinse BID  . Chlorhexidine Gluconate Cloth  6 each Topical Q0600  . [START ON 08/08/2019] epoetin (EPOGEN/PROCRIT) injection  10,000 Units Intravenous Q T,Th,Sa-HD  . hydrocortisone sod succinate (SOLU-CORTEF) inj  100 mg Intravenous Q12H  . insulin aspart  0-9 Units Subcutaneous Q4H  . ipratropium-albuterol  3 mL Nebulization Q6H  . mouth rinse  15  mL Mouth Rinse 10 times per day  . pantoprazole (PROTONIX) IV  40 mg Intravenous QHS  . potassium chloride  40 mEq Per  Tube Once  . sodium chloride flush  3 mL Intravenous Q12H   Continuous Infusions: . sodium chloride    . sodium chloride    . sodium chloride    . sodium chloride    . ceFEPime (MAXIPIME) IV Stopped (08/06/19 1740)  . famotidine (PEPCID) IV Stopped (08/06/19 1840)  . fentaNYL infusion INTRAVENOUS 175 mcg/hr (08/07/19 0400)  . heparin 800 Units/hr (08/07/19 0601)  . levETIRAcetam 500 mg (08/07/19 0852)  . midazolam 2 mg/hr (08/07/19 0600)  . norepinephrine (LEVOPHED) Adult infusion 10.667 mcg/min (08/07/19 0400)    Assessment/Plan:  1. PEA arrest.  Patient had return of spontaneous circulation.  On heparin drip.  Patient is a DNR.  2. Acute hypoxic and hypercapnic respiratory failure.  Patient extubated today and placed on nasal cannula..  On cefepime. 3. Acute on chronic systolic congestive heart failure with EF less than 20.  Dialysis to manage fluid. 4. End-stage renal disease on hemodialysis.  Dialysis done yesterday 5. Possible seizure on Keppra.  Neurology recommended reimaging of the brain in a few days.  Patient following commands. 6. History of hypertension 7. History of PAD  Code Status:     Code Status Orders  (From admission, onward)         Start     Ordered   08/05/19 0653  Do not attempt resuscitation (DNR)  Continuous    Question Answer Comment  In the event of cardiac or respiratory ARREST Do not call a "code blue"   In the event of cardiac or respiratory ARREST Do not perform Intubation, CPR, defibrillation or ACLS   In the event of cardiac or respiratory ARREST Use medication by any route, position, wound care, and other measures to relive pain and suffering. May use oxygen, suction and manual treatment of airway obstruction as needed for comfort.      08/05/19 6256        Code Status History    Date Active Date Inactive Code Status  Order ID Comments User Context   08/05/2019 0613 08/05/2019 0652 Full Code [id number]  [name], NP Inpatient   08/04/2019 0127 08/05/2019 0613 Full Code [id number]  Mansy, Arvella Merles, MD ED   07/27/2019 1110 07/29/2019 1719 Full Code [id number]  Hillary Bow, MD ED   07/20/2019 2041 07/23/2019 1929 DNR [id number]  Vaughan Basta, MD Inpatient   07/09/2019 0543 07/12/2019 1502 Full Code [id number]  Harrie Foreman, MD Inpatient   07/02/2019 2128 07/06/2019 1557 Full Code [id number]  Henreitta Leber, MD Inpatient   05/19/2019 0603 05/22/2019 1957 Full Code [id number]  Lance Coon, MD Inpatient   02/28/2019 1805 03/03/2019 2234 Full Code [id number]  Vaughan Basta, MD Inpatient   02/21/2019 0835 02/24/2019 1945 Full Code [id number]  Harrie Foreman, MD Inpatient   02/04/2019 1343 02/09/2019 2050 Full Code [id number]  Saundra Shelling, MD ED   01/27/2019 0654 01/29/2019 2049 Partial Code [id number]  Harrie Foreman, MD Inpatient   01/07/2019 2156 01/27/2019 0638 Partial Code [id number]  Tennis Ship, MD Inpatient   12/25/2018 0158 01/07/2019 2027 Partial Code [id number]  Ina Homes, MD ED   Advance Care Planning Activity     Family Communication: As per critical care specialist Disposition Plan: To be determined  Consultants:  Critical care specialist  Cardiology  Nephrology  Antibiotics:  Cefepime  Time spent: 27 minutes.  Case discussed with respiratory, critical care specialist and nursing staff.  Shashana Fullington [employer]

## 2019-08-08 LAB — MAGNESIUM: Magnesium: 2.8 mg/dL — ABNORMAL HIGH (ref 1.7–2.4)

## 2019-08-08 LAB — CBC
HCT: 25.3 % — ABNORMAL LOW (ref 39.0–52.0)
Hemoglobin: 7.8 g/dL — ABNORMAL LOW (ref 13.0–17.0)
MCH: 31.7 pg (ref 26.0–34.0)
MCHC: 30.8 g/dL (ref 30.0–36.0)
MCV: 102.8 fL — ABNORMAL HIGH (ref 80.0–100.0)
Platelets: 131 10*3/uL — ABNORMAL LOW (ref 150–400)
RBC: 2.46 MIL/uL — ABNORMAL LOW (ref 4.22–5.81)
RDW: 17.6 % — ABNORMAL HIGH (ref 11.5–15.5)
WBC: 11.2 10*3/uL — ABNORMAL HIGH (ref 4.0–10.5)
nRBC: 0 % (ref 0.0–0.2)

## 2019-08-08 LAB — BASIC METABOLIC PANEL
Anion gap: 11 (ref 5–15)
BUN: 33 mg/dL — ABNORMAL HIGH (ref 6–20)
CO2: 25 mmol/L (ref 22–32)
Calcium: 8.9 mg/dL (ref 8.9–10.3)
Chloride: 104 mmol/L (ref 98–111)
Creatinine, Ser: 4.01 mg/dL — ABNORMAL HIGH (ref 0.61–1.24)
GFR calc Af Amer: 20 mL/min — ABNORMAL LOW (ref >60)
GFR calc non Af Amer: 17 mL/min — ABNORMAL LOW (ref >60)
Glucose, Bld: 132 mg/dL — ABNORMAL HIGH (ref 70–99)
Potassium: 4.7 mmol/L (ref 3.5–5.1)
Sodium: 140 mmol/L (ref 135–145)

## 2019-08-08 LAB — CULTURE, RESPIRATORY W GRAM STAIN
Culture: NORMAL
Special Requests: NORMAL

## 2019-08-08 LAB — GLUCOSE, CAPILLARY
Glucose-Capillary: 120 mg/dL — ABNORMAL HIGH (ref 70–99)
Glucose-Capillary: 120 mg/dL — ABNORMAL HIGH (ref 70–99)
Glucose-Capillary: 120 mg/dL — ABNORMAL HIGH (ref 70–99)
Glucose-Capillary: 101 mg/dL — ABNORMAL HIGH (ref 70–99)
Glucose-Capillary: 142 mg/dL — ABNORMAL HIGH (ref 70–99)

## 2019-08-08 LAB — CULTURE, BLOOD (ROUTINE X 2)
Culture: NO GROWTH
Special Requests: ADEQUATE

## 2019-08-08 LAB — APTT: aPTT: 72 seconds — ABNORMAL HIGH (ref 24–36)

## 2019-08-08 LAB — TROPONIN I (HIGH SENSITIVITY): Troponin I (High Sensitivity): 101 ng/L (ref <18)

## 2019-08-08 MED ORDER — KETOROLAC TROMETHAMINE 15 MG/ML IJ SOLN
15.0000 mg | Freq: Once | INTRAMUSCULAR | Status: AC
  Administered 2019-08-08: 15 mg via INTRAVENOUS
  Filled 2019-08-08: qty 1

## 2019-08-08 NOTE — Progress Notes (Signed)
ANTICOAGULATION CONSULT NOTE  Pharmacy Consult for Heparin Indication: ACS/STEMI  Patient Measurements: Height: 5' 0.98" (154.9 cm) Weight: 112 lb 7 oz (51 kg) IBW/kg (Calculated) : 52.26 HEPARIN DW (KG): 43.4  Vital Signs: Temp: 98.4 F (36.9 C) (10/17 0500) Temp Source: Rectal (10/17 0500) BP: 134/96 (10/17 0500) Pulse Rate: 88 (10/17 0500)  Labs: Recent Labs    08/05/19 0744 08/05/19 0906  08/06/19 0422 08/06/19 1016 08/06/19 1958 08/07/19 0312 08/08/19 0347  HGB  --   --    < > 8.0*  --  7.4* 7.7* 7.8*  HCT  --   --    < > 25.0*  --  22.8* 23.5* 25.3*  PLT  --   --    < > 116*  --  124* 122* 131*  APTT  --  59*   < > 77*  --   --  66* 72*  LABPROT  --  18.2*  --   --   --   --   --   --   INR  --  1.5*  --   --   --   --   --   --   HEPARINUNFRC  --   --   --   --  0.85*  --   --   --   CREATININE  --  2.87*   < > 3.65*  --  2.16* 2.57* 4.01*  TROPONINIHS 72*  --   --   --   --   --   --   --    < > = values in this interval not displayed.   Estimated Creatinine Clearance: 16.8 mL/min (A) (by C-G formula based on SCr of 4.01 mg/dL (H)).  Medical History: Past Medical History:  Diagnosis Date  . ESRD (end stage renal disease) ([place])   . Hypertension   . PAD (peripheral artery disease) ([facility])    Required aortofemoral stent-which had closed and had to redo the procedure and ischemia of limb.  . Peripheral vascular disease ([place])   . Renal disorder   . Secondary hyperparathyroidism of renal origin [facility])    Medications:  Medications Prior to Admission  Medication Sig Dispense Refill Last Dose  . amLODipine (NORVASC) 10 MG tablet Take 1 tablet (10 mg total) by mouth daily at 8 pm. 30 tablet 2 unknown at unknown  . apixaban (ELIQUIS) 2.5 MG TABS tablet Take 1 tablet (2.5 mg total) by mouth 2 (two) times daily. 60 tablet 1 unknown at unknown  . aspirin EC 81 MG tablet Take 1 tablet (81 mg total) by mouth daily. 30 tablet 1 unknown at unknown  . atorvastatin  (LIPITOR) 80 MG tablet Take 1 tablet (80 mg total) by mouth at bedtime. 30 tablet 1 unknown at unknown  . b complex-C-folic acid 1 MG capsule Take 1 capsule by mouth daily after supper.    unknown at unknown  . carvedilol (COREG) 25 MG tablet Take 1 tablet (25 mg total) by mouth 2 (two) times daily with a meal. 60 tablet 1 unknown at unknown  . gabapentin (NEURONTIN) 100 MG capsule Take 1 capsule (100 mg total) by mouth 3 (three) times daily. 90 capsule 1 unknown at unknown  . hydrALAZINE (APRESOLINE) 25 MG tablet Take 1 tablet (25 mg total) by mouth every 8 (eight) hours. 90 tablet 1 unknown at unknown  . hydrOXYzine (ATARAX/VISTARIL) 25 MG tablet Take 25 mg by mouth 3 (three) times daily as needed for anxiety.   unknown  at unknown  . loratadine (CLARITIN) 10 MG tablet Take 1 tablet (10 mg total) by mouth daily. 30 tablet 0 unknown at unknown  . multivitamin (RENA-VIT) TABS tablet Take 1 tablet by mouth daily. 30 each 1 unknown at unknown  . neomycin-bacitracin-polymyxin (NEOSPORIN) OINT Apply 1 application topically 2 (two) times daily. 56 g 1 unknown at unknown  . Nutritional Supplements (FEEDING SUPPLEMENT, NEPRO CARB STEADY,) LIQD Take 237 mLs by mouth 2 (two) times daily between meals. 237 mL 30 unknown at unknown  . sevelamer carbonate (RENVELA) 800 MG tablet Take 2 tablets (1,600 mg total) by mouth 3 (three) times daily with meals. 180 tablet 1 unknown at unknown    Assessment: Pharmacy asked to initiate Heparin protocol for a suspected PE vs CVA s/p MI this am.  Pt has been on Apixaban w/ last dose being given on 10/13 at 2117.  H&H, PLT trending down: continue to monitor  Heparin Course: 10/14 am initiation: 1000 unit bolus, then 700 units/hr 10/14 1318: inc to 750 units/hr 10/14 2325 aPTT = 89, therapeutic 10/15 0422 aPTT = 77, therapeutic x 2.  CBC stable.  10/16 0312 aPTT = 66, therapeutic x 3.  CBC stable.  10/17 0347 aPTT = 72, therapeutic x 4.  CBC stable.   Goal of Therapy:   aPTT 66-102 seconds Monitor platelets by anticoagulation protocol: Yes   Plan:   aPTT is within goal range, continue Heparin infusion at 800 units/hr   Check aPTT in am  CBC in am  Ena Dawley, PharmD 08/08/2019,5:40 AM

## 2019-08-08 NOTE — Progress Notes (Signed)
This note also relates to the following rows which could not be included: Pulse Rate - Cannot attach notes to unvalidated device data Resp - Cannot attach notes to unvalidated device data BP - Cannot attach notes to unvalidated device data  Hd completed  

## 2019-08-08 NOTE — Progress Notes (Signed)
Name: Brett Small MRN: [id number] DOB: 27-Apr-1976     CONSULTATION DATE: 08/05/2019  CHIEF COMPLAINT:  Cardiac arrest  STUDIES/SIGNIFICANT EVENTS: 10/13 - admitted as telemetry patient 10/14 - cardiac arrest 10/14 - CT Head negative 10/14 - targeted temperature management initiated 10/14 - cardiology and neurology consulted; patient made DNR 10/15-purposeful movements noted on exam today when weaning sedation 10/16-patient has some purposeful movements, will perform weaning trial today   10/17 - patient clinically improved, will advance diet to clears today   HISTORY OF PRESENT ILLNESS:   Brett Small is a 43 yo male with a past medical history of ESRD on hemodialysis, hypertension, peripheral vascular disease, and CHF with EF <20% who presented to the ED complaining of shortness of breath, dry cough, and wheezing. He was placed on BiPAP and admitted to the hospital for dialysis for fluid overload and flash pulmonary edema. Pt was then weaned off BiPAP.  On 10/14, pt became suddenly altered and then unresponsive with agonal respirations, leading to respiratory arrest with bradycardia and eventual PEA arrest requiring approximately 10 minutes of CPR/ACLS before ROSC was achieved. Pt was intubated during arrest. Post arrest, pt was still unresponsive with decorticate posturing to pain stimuli. He was placed on targeted temperature management to 36 C and placed on a heparin drip because of possibility of PE given significantly elevated D dimer.   PAST MEDICAL HISTORY :   has a past medical history of ESRD (end stage renal disease) ([place]), Hypertension, PAD (peripheral artery disease) ([place]), Peripheral vascular disease ([place]), Renal disorder, and Secondary hyperparathyroidism of renal origin ([place]).  has a past surgical history that includes Aorta - femoral artery bypass graft; AV fistula placement (Left, 12/26/2018); A/V SHUNT INTERVENTION (Left, 01/19/2019); DIALYSIS/PERMA CATHETER REMOVAL  (N/A, 02/06/2019); A/V SHUNT INTERVENTION (Left, 03/02/2019); LEFT HEART CATH AND CORONARY ANGIOGRAPHY (Right, 07/10/2019); and PERIPHERAL VASCULAR THROMBECTOMY (Left, 07/28/2019). Prior to Admission medications   Medication Sig Start Date End Date Taking? Authorizing Provider  amLODipine (NORVASC) 10 MG tablet Take 1 tablet (10 mg total) by mouth daily at 8 pm. 02/24/19  Yes Gladstone Lighter, MD  apixaban (ELIQUIS) 2.5 MG TABS tablet Take 1 tablet (2.5 mg total) by mouth 2 (two) times daily. 01/26/19  Yes Clapacs, Madie Reno, MD  aspirin EC 81 MG tablet Take 1 tablet (81 mg total) by mouth daily. 01/26/19  Yes Clapacs, Madie Reno, MD  atorvastatin (LIPITOR) 80 MG tablet Take 1 tablet (80 mg total) by mouth at bedtime. 01/26/19  Yes Clapacs, Madie Reno, MD  b complex-C-folic acid 1 MG capsule Take 1 capsule by mouth daily after supper.    Yes [provider]  carvedilol (COREG) 25 MG tablet Take 1 tablet (25 mg total) by mouth 2 (two) times daily with a meal. 01/26/19  Yes Clapacs, Madie Reno, MD  gabapentin (NEURONTIN) 100 MG capsule Take 1 capsule (100 mg total) by mouth 3 (three) times daily. 01/26/19  Yes Clapacs, Madie Reno, MD  hydrALAZINE (APRESOLINE) 25 MG tablet Take 1 tablet (25 mg total) by mouth every 8 (eight) hours. 01/26/19  Yes Clapacs, Madie Reno, MD  hydrOXYzine (ATARAX/VISTARIL) 25 MG tablet Take 25 mg by mouth 3 (three) times daily as needed for anxiety.   Yes [provider]  loratadine (CLARITIN) 10 MG tablet Take 1 tablet (10 mg total) by mouth daily. 03/04/19  Yes Salary, Avel Peace, MD  multivitamin (RENA-VIT) TABS tablet Take 1 tablet by mouth daily. 01/27/19  Yes Clapacs, Madie Reno, MD  neomycin-bacitracin-polymyxin (NEOSPORIN)  OINT Apply 1 application topically 2 (two) times daily. 01/26/19  Yes Clapacs, Madie Reno, MD  Nutritional Supplements (FEEDING SUPPLEMENT, NEPRO CARB STEADY,) LIQD Take 237 mLs by mouth 2 (two) times daily between meals. 07/06/19  Yes Epifanio Lesches, MD  sevelamer carbonate  (RENVELA) 800 MG tablet Take 2 tablets (1,600 mg total) by mouth 3 (three) times daily with meals. 01/26/19  Yes Clapacs, Madie Reno, MD   Allergies  Allergen Reactions  . Codeine Nausea Only    Patient questioned this (??)  . Sulfa Antibiotics Hives and Nausea And Vomiting    REVIEW OF SYSTEMS:   Unable to obtain due to critical illness   VITAL SIGNS: Temp:  [97.7 F (36.5 C)-98.8 F (37.1 C)] 98.4 F (36.9 C) (10/17 1200) Pulse Rate:  [80-105] 98 (10/17 1700) Resp:  [7-21] 10 (10/17 1700) BP: (114-180)/(78-116) 161/99 (10/17 1700) SpO2:  [97 %-100 %] 100 % (10/17 1410) Weight:  [51 kg] 51 kg (10/17 0500)   I/O last 3 completed shifts: In: 1284.5 [I.V.:784.5; IV Piggyback:500] Out: 535 [Urine:435; Emesis/NG output:100] Total I/O In: 164 [I.V.:64; IV Piggyback:100] Out: -    SpO2: 100 % O2 Flow Rate (L/min): 3 L/min FiO2 (%): 24 %      Physical Examination:  GENERAL: Chronically ill appearing, intubated and sedated HEAD: Normocephalic, atraumatic.  EYES: Fixed pupils of grossly different sizes. No scleral icterus.  MOUTH: Moist mucosal membrane. NECK: Supple. No JVD.  PULMONARY: lungs clear to auscultation CARDIOVASCULAR: S1 and S2. Regular rate and rhythm. No murmurs, rubs, or gallops.  GASTROINTESTINAL: Soft, nontender, -distended. No masses. Positive bowel sounds. No hepatosplenomegaly.  MUSCULOSKELETAL: No swelling, clubbing, or edema.  NEUROLOGIC: Sedated on mechanical ventilation SKIN:intact,warm,dry  I personally reviewed lab work that was obtained in last 24 hrs. CXR Independently reviewed  MEDICATIONS: I have reviewed all medications and confirmed regimen as documented   CULTURE RESULTS   Recent Results (from the past 240 hour(s))  SARS Coronavirus 2 by RT PCR (hospital order, performed in [facility] hospital lab) Nasopharyngeal Nasopharyngeal Swab     Status: None   Collection Time: 08/03/19  9:25 AM   Specimen: Nasopharyngeal Swab  Result Value  Ref Range Status   SARS Coronavirus 2 NEGATIVE NEGATIVE Final    Comment: (NOTE) If result is NEGATIVE SARS-CoV-2 target nucleic acids are NOT DETECTED. The SARS-CoV-2 RNA is generally detectable in upper and lower  respiratory specimens during the acute phase of infection. The lowest  concentration of SARS-CoV-2 viral copies this assay can detect is 250  copies / mL. A negative result does not preclude SARS-CoV-2 infection  and should not be used as the sole basis for treatment or other  patient management decisions.  A negative result may occur with  improper specimen collection / handling, submission of specimen other  than nasopharyngeal swab, presence of viral mutation(s) within the  areas targeted by this assay, and inadequate number of viral copies  (<250 copies / mL). A negative result must be combined with clinical  observations, patient history, and epidemiological information. If result is POSITIVE SARS-CoV-2 target nucleic acids are DETECTED. The SARS-CoV-2 RNA is generally detectable in upper and lower  respiratory specimens dur ing the acute phase of infection.  Positive  results are indicative of active infection with SARS-CoV-2.  Clinical  correlation with patient history and other diagnostic information is  necessary to determine patient infection status.  Positive results do  not rule out bacterial infection or co-infection with other viruses. If result  is PRESUMPTIVE POSTIVE SARS-CoV-2 nucleic acids MAY BE PRESENT.   A presumptive positive result was obtained on the submitted specimen  and confirmed on repeat testing.  While 2019 novel coronavirus  (SARS-CoV-2) nucleic acids may be present in the submitted sample  additional confirmatory testing may be necessary for epidemiological  and / or clinical management purposes  to differentiate between  SARS-CoV-2 and other Sarbecovirus currently known to infect humans.  If clinically indicated additional testing with an  alternate test  methodology [phone]) is advised. The SARS-CoV-2 RNA is generally  detectable in upper and lower respiratory sp ecimens during the acute  phase of infection. The expected result is Negative. Fact Sheet for Patients:  StrictlyIdeas.no Fact Sheet for Healthcare Providers: BankingDealers.co.za This test is not yet approved or cleared by the [place] FDA and has been authorized for detection and/or diagnosis of SARS-CoV-2 by FDA under an Emergency Use Authorization (EUA).  This EUA will remain in effect (meaning this test can be used) for the duration of the COVID-19 declaration under Section 564(b)(1) of the Act, 21 U.S.C. section 360bbb-3(b)(1), unless the authorization is terminated or revoked sooner. Performed at [facility], [address] [place], [place] [address]   Blood culture (routine x 2)     Status: None   Collection Time: 08/03/19 10:05 AM   Specimen: BLOOD  Result Value Ref Range Status   Specimen Description BLOOD BLOOD RIGHT FOREARM  Final   Special Requests   Final    BOTTLES DRAWN AEROBIC AND ANAEROBIC Blood Culture adequate volume   Culture   Final    NO GROWTH 5 DAYS Performed at [facility], [address] [place], [place] [address]    Report Status 08/08/2019 FINAL  Final  MRSA PCR Screening     Status: None   Collection Time: 08/04/19  7:56 AM   Specimen: Nasopharyngeal  Result Value Ref Range Status   MRSA by PCR NEGATIVE NEGATIVE Final    Comment:        The GeneXpert MRSA Assay (FDA approved for NASAL specimens only), is one component of a comprehensive MRSA colonization surveillance program. It is not intended to diagnose MRSA infection nor to guide or monitor treatment for MRSA infections. Performed at [facility], [address] [place], [place] [address]   Urine Culture     Status: None   Collection Time: 08/05/19 10:14 AM   Specimen:  Urine, Random  Result Value Ref Range Status   Specimen Description   Final    URINE, RANDOM Performed at [facility], [address] [place], [place] [address]    Special Requests   Final    NONE Performed at [facility], [address], [place], [place] [address]    Culture   Final    NO GROWTH Performed at [facility], [place] [address], [place], [place] [address]    Report Status 08/06/2019 FINAL  Final  Culture, respiratory (non-expectorated)     Status: None   Collection Time: 08/05/19  8:40 PM   Specimen: Tracheal Aspirate; Respiratory  Result Value Ref Range Status   Specimen Description   Final    TRACHEAL ASPIRATE Performed at [facility], [address], [place], [place] [address]    Special Requests   Final    Normal Performed at [facility], [address] [place], [place] [address]    Gram Stain   Final    FEW WBC PRESENT,BOTH  PMN AND MONONUCLEAR NO ORGANISMS SEEN    Culture   Final    RARE Consistent with normal respiratory flora. Performed at [facility], [place] [address], [place], [place] [address]    Report Status 08/08/2019 FINAL  Final          IMAGING    No results found.      Indwelling Urinary Catheter continued, requirement due to   Reason to continue Indwelling Urinary Catheter strict Intake/Output monitoring for hemodynamic instability   Central Line/ continued, requirement due to  Reason to continue [place] of central venous pressure or other hemodynamic parameters and poor IV access   Ventilator continued, requirement due to severe respiratory failure   Ventilator Sedation RASS 0 to -2     ASSESSMENT AND PLAN  43 yo male with ESRD on HD and CHF with EF <20% admitted to the ICU with acute hypoxic respiratory distress secondary to flash pulmonary edema from CHF exacerbation. Pt had respiratory and eventual cardiac arrest in ICU with PEA that required ACLS  resuscitation secondary to differential of PE vs CHF with volume overload vs pneumonia.  Severe ACUTE Hypoxic and Hypercapnic Respiratory Failure Post cardiac arrest  -continue Full MV support -continue Bronchodilator Therapy -Wean Fio2 and PEEP as tolerated -will perform SAT/SBT when respiratory parameters are met -possible acute venous pulmonary thromboembolism  Cardiac arrest, with cardiogenic shock ROSC achieved; pt now DNR - TTM at [facility]- cooling is over - will start rewarming and waking up  - maintain MAP >65 using vasopressors - Repeat echo pending - follow up cardiology recs  ACUTE on [place]- EF <20% -oxygen as needed -Lasix as tolerated -follow up cardiac enzymes as indicated -follow up cardiology recs  End stage renal disease on HD -follow BMP -follow UO -continue Foley Catheter-assess need -Avoid nephrotoxic agents -Nephrology consulted, follow up on recs  NEUROLOGY - intubated and sedated -minimize sedation to optimize for SBT and better neuro eval  - neurology on case - Dr Irish Elders - appreciate input Wake up assessment - localizes off sedation  CARDIAC ICU monitoring  ID -continue IV abx as prescibed -follow up cultures  GI GI PROPHYLAXIS as indicated  NUTRITIONAL STATUS DIET-->TF's as tolerated Constipation protocol as indicated  ENDO - will use ICU hypoglycemic\Hyperglycemia protocol if needed  ELECTROLYTES -follow labs as needed -replace as needed -pharmacy consultation and following  DVT/GI PRX ordered TRANSFUSIONS AS NEEDED MONITOR FSBS ASSESS the need for LABS      Ottie Glazier, M.D.  P[facility] [place]    Critical care provider statement:    Critical care time (minutes):  33   Critical care time was exclusive of:  Separately billable procedures and  treating other patients   Critical care was necessary to treat or prevent imminent or  life-threatening  deterioration of the following conditions:   Cardiac arrest, acute hypoxemic respiratory failure, multiple comorbid conditions   Critical care was time spent personally by me on the following  activities:  Development of treatment plan with patient or surrogate,  discussions with consultants, evaluation of patient's response to  treatment, examination of patient, obtaining history from patient or  surrogate, ordering and performing treatments and interventions, ordering  and review of laboratory studies and re-evaluation of patient's condition   I assumed direction of critical care for this patient from another  provider in my specialty: no

## 2019-08-08 NOTE — Progress Notes (Signed)
This note also relates to the following rows which could not be included: Resp - Cannot attach notes to unvalidated device data BP - Cannot attach notes to unvalidated device data  Hd started  

## 2019-08-08 NOTE — Consult Note (Signed)
[place] for Electrolyte Monitoring and Replacement   Recent Labs: Potassium (mmol/L)  Date Value  08/08/2019 4.7   Magnesium (mg/dL)  Date Value  08/08/2019 2.8 (H)   Calcium (mg/dL)  Date Value  08/08/2019 8.9   Albumin (g/dL)  Date Value  08/06/2019 2.4 (L)   Phosphorus (mg/dL)  Date Value  08/06/2019 9.1 (H)   Sodium (mmol/L)  Date Value  08/08/2019 140   Corrected Ca: 9.68 mg/dL  Assessment: 43 y.o. male admitted on 08/03/2019 with sepsis. On 10/14, pt became suddenly altered and then unresponsive with agonal respirations, leading to respiratory arrest with bradycardia and eventual PEA arrest requiring approximately 10 minutes of CPR/ACLS before ROSC was achieved. Pt was intubated during arrest and remains intubated and sedated.  Goal of Therapy:  Due to cardiac history: Potassium 4.0 - 5.1 mmol/L Magnesium 2.0 - 2.4 mg/dL Other electrolytes WNL  Plan:  No replacement indicated today. Will check all electrolytes with am labs.  Tawnya Crook ,PharmD Clinical Pharmacist 08/08/2019 7:46 AM

## 2019-08-08 NOTE — Progress Notes (Signed)
Patient ID: Brett Small, male   DOB: [birth year], 43 y.o.   MRN: [id number]  [facility] PROGRESS NOTE  Brett Small MAY:[id number] DOB: 09-Feb-1976 DOA: 08/03/2019 PCP: Patient, [name]  HPI/Subjective: Patient was extubated 08/07/2019.  Extremely weak, did not complain of any shortness of breath or chest pain.    Objective: Vitals:   08/08/19 1000 08/08/19 1100  BP: 135/89 (!) 142/99  Pulse: 95 95  Resp: 12 17  Temp:    SpO2: 98% 100%    Intake/Output Summary (Last 24 hours) at 08/08/2019 1319 Last data filed at 08/08/2019 1100 Gross per 24 hour  Intake 764.02 ml  Output 400 ml  Net 364.02 ml   Filed Weights   08/06/19 1600 08/07/19 0219 08/08/19 0500  Weight: 51.8 kg 49.7 kg 51 kg    ROS: Review of Systems  Unable to perform ROS: Acuity of condition  Respiratory: Negative for shortness of breath.   Cardiovascular: Negative for chest pain.  Gastrointestinal: Negative for abdominal pain.   Exam: Physical Exam  HENT:  Mouth/Throat: No oropharyngeal exudate.  Unable to look on the mouth  Eyes: Pupils are equal, round, and reactive to light. Conjunctivae and lids are normal.  Neck: Trachea normal. Carotid bruit is not present.  Cardiovascular: Regular rhythm, S1 normal, S2 normal and normal heart sounds.  Respiratory: He has decreased breath sounds in the right lower field and the left lower field. He has no wheezes. He has rhonchi in the right lower field and the left lower field. He has no rales.  GI: Soft. Bowel sounds are normal. There is no abdominal tenderness.  Musculoskeletal:     Right ankle: He exhibits no swelling.     Left ankle: He exhibits no swelling.  Neurological: He is alert.  Follows commands and able to move all extremities.  Skin: Skin is warm. No rash noted. Nails show no clubbing.  Psychiatric: He has a normal mood and affect.      Data Reviewed: Basic Metabolic Panel: Recent Labs  Lab 08/04/19 0845  08/05/19 1819  08/06/19 0422 08/06/19 1124 08/06/19 1958 08/07/19 0312 08/08/19 0347  NA 139   < > 137 138  --  135 137 140  K 3.4*   < > 4.7 4.8  --  3.6 3.8 4.7  CL 99   < > 100 103  --  99 102 104  CO2 26   < > 22 19*  --  '26 27 25  ' GLUCOSE 82   < > 98 112*  --  138* 129* 132*  BUN 18   < > 30* 36*  --  17 22* 33*  CREATININE 2.86*   < > 3.44* 3.65*  --  2.16* 2.57* 4.01*  CALCIUM 8.3*   < > 9.1 8.7*  --  8.2* 8.4* 8.9  MG  --   --   --   --   --  1.6* 1.7 2.8*  PHOS 3.9  --   --   --  9.1*  --   --   --    < > = values in this interval not displayed.   Liver Function Tests: Recent Labs  Lab 08/03/19 0925 08/04/19 0845 08/05/19 0431 08/06/19 0422  AST 28  --  117* 79*  ALT 11  --  67* 66*  ALKPHOS 73  --  94 75  BILITOT 1.5*  --  1.2 1.7*  PROT 6.4*  --  6.2* 5.8*  ALBUMIN 2.9* 2.7* 2.6*  2.4*   CBC: Recent Labs  Lab 08/04/19 0349 08/05/19 0431 08/05/19 1132 08/05/19 1819 08/06/19 0422 08/06/19 1958 08/07/19 0312 08/08/19 0347  WBC 10.1 12.3* 12.1* 8.0 6.9 8.2 7.4 11.2*  NEUTROABS 8.0* 10.7* 9.9* 7.1  --  7.0  --   --   HGB 9.2* 8.2* 8.2* 8.3* 8.0* 7.4* 7.7* 7.8*  HCT 28.8* 26.8* 26.3* 26.3* 25.0* 22.8* 23.5* 25.3*  MCV 100.0 104.3* 102.7* 102.3* 99.6 97.9 97.1 102.8*  PLT 150 135* 134* 122* 116* 124* 122* 131*   BNP (last 3 results) Recent Labs    07/20/19 1029 08/03/19 0925 08/05/19 0431  BNP >4,500.0* >4,500.0* 3,772.0*     CBG: Recent Labs  Lab 08/07/19 2008 08/07/19 2339 08/08/19 0345 08/08/19 0748 08/08/19 1107  GLUCAP 119* 114* 120* 120* 120*    Recent Results (from the past 240 hour(s))  SARS Coronavirus 2 by RT PCR (hospital order, performed in [facility] hospital lab) Nasopharyngeal Nasopharyngeal Swab     Status: None   Collection Time: 08/03/19  9:25 AM   Specimen: Nasopharyngeal Swab  Result Value Ref Range Status   SARS Coronavirus 2 NEGATIVE NEGATIVE Final    Comment: (NOTE) If result is NEGATIVE SARS-CoV-2 target nucleic acids are NOT  DETECTED. The SARS-CoV-2 RNA is generally detectable in upper and lower  respiratory specimens during the acute phase of infection. The lowest  concentration of SARS-CoV-2 viral copies this assay can detect is 250  copies / mL. A negative result does not preclude SARS-CoV-2 infection  and should not be used as the sole basis for treatment or other  patient management decisions.  A negative result may occur with  improper specimen collection / handling, submission of specimen other  than nasopharyngeal swab, presence of viral mutation(s) within the  areas targeted by this assay, and inadequate number of viral copies  (<250 copies / mL). A negative result must be combined with clinical  observations, patient history, and epidemiological information. If result is POSITIVE SARS-CoV-2 target nucleic acids are DETECTED. The SARS-CoV-2 RNA is generally detectable in upper and lower  respiratory specimens dur ing the acute phase of infection.  Positive  results are indicative of active infection with SARS-CoV-2.  Clinical  correlation with patient history and other diagnostic information is  necessary to determine patient infection status.  Positive results do  not rule out bacterial infection or co-infection with other viruses. If result is PRESUMPTIVE POSTIVE SARS-CoV-2 nucleic acids MAY BE PRESENT.   A presumptive positive result was obtained on the submitted specimen  and confirmed on repeat testing.  While 2019 novel coronavirus  (SARS-CoV-2) nucleic acids may be present in the submitted sample  additional confirmatory testing may be necessary for epidemiological  and / or clinical management purposes  to differentiate between  SARS-CoV-2 and other Sarbecovirus currently known to infect humans.  If clinically indicated additional testing with an alternate test  methodology [phone]) is advised. The SARS-CoV-2 RNA is generally  detectable in upper and lower respiratory sp ecimens during  the acute  phase of infection. The expected result is Negative. Fact Sheet for Patients:  StrictlyIdeas.no Fact Sheet for Healthcare Providers: BankingDealers.co.za This test is not yet approved or cleared by the [place] FDA and has been authorized for detection and/or diagnosis of SARS-CoV-2 by FDA under an Emergency Use Authorization (EUA).  This EUA will remain in effect (meaning this test can be used) for the duration of the COVID-19 declaration under Section 564(b)(1) of the Act, 21  U.S.C. section 360bbb-3(b)(1), unless the authorization is terminated or revoked sooner. Performed at [facility], [address] [place], [place] [address]   Blood culture (routine x 2)     Status: None   Collection Time: 08/03/19 10:05 AM   Specimen: BLOOD  Result Value Ref Range Status   Specimen Description BLOOD BLOOD RIGHT FOREARM  Final   Special Requests   Final    BOTTLES DRAWN AEROBIC AND ANAEROBIC Blood Culture adequate volume   Culture   Final    NO GROWTH 5 DAYS Performed at [facility], [address] [place], [place] [address]    Report Status 08/08/2019 FINAL  Final  MRSA PCR Screening     Status: None   Collection Time: 08/04/19  7:56 AM   Specimen: Nasopharyngeal  Result Value Ref Range Status   MRSA by PCR NEGATIVE NEGATIVE Final    Comment:        The GeneXpert MRSA Assay (FDA approved for NASAL specimens only), is one component of a comprehensive MRSA colonization surveillance program. It is not intended to diagnose MRSA infection nor to guide or monitor treatment for MRSA infections. Performed at [facility], [address] [place], [place] [address]   Urine Culture     Status: None   Collection Time: 08/05/19 10:14 AM   Specimen: Urine, Random  Result Value Ref Range Status   Specimen Description   Final    URINE, RANDOM Performed at [facility], [address] [place], [place] [address]    Special Requests   Final    NONE Performed at [facility], [address], [place] [address]    Culture   Final    NO GROWTH Performed at [facility], [place] [employer] [address] [place], [place] [address]    Report Status 08/06/2019 FINAL  Final  Culture, respiratory (non-expectorated)     Status: None   Collection Time: 08/05/19  8:40 PM   Specimen: Tracheal Aspirate; Respiratory  Result Value Ref Range Status   Specimen Description   Final    TRACHEAL ASPIRATE Performed at [facility], [address] [place], [place] [address]    Special Requests   Final    Normal Performed at [facility], [address] [place], [place] [address]    Gram Stain   Final    FEW WBC PRESENT,BOTH PMN AND MONONUCLEAR NO ORGANISMS SEEN    Culture   Final    RARE Consistent with normal respiratory flora. Performed at [facility], [place] [address] [place], [place] [address]    Report Status 08/08/2019 FINAL  Final     Studies: No results found.  Scheduled Meds: . aspirin  81 mg Per Tube Daily  . chlorhexidine gluconate (MEDLINE KIT)  15 mL Mouth Rinse BID  . Chlorhexidine Gluconate Cloth  6 each Topical Q0600  . epoetin (EPOGEN/PROCRIT) injection  10,000 Units Intravenous Q T,Th,Sa-HD  . hydrocortisone sod succinate (SOLU-CORTEF) inj  100 mg Intravenous Q12H  . insulin aspart  0-9 Units Subcutaneous Q4H  . mouth rinse  15 mL Mouth Rinse 10 times per day  . pantoprazole (PROTONIX) IV  40 mg Intravenous QHS  . sodium chloride flush  3 mL Intravenous Q12H   Continuous Infusions: . sodium chloride    . sodium chloride    . sodium chloride    . sodium chloride    . ceFEPime (MAXIPIME) IV 2 g (08/08/19  1208)  . famotidine (PEPCID) IV Stopped (08/07/19 1839)  . heparin 800 Units/hr (08/08/19 1100)  . levETIRAcetam Stopped (08/08/19 0857)  . norepinephrine (LEVOPHED) Adult infusion Stopped (08/07/19 1505)     Assessment/Plan:  1. PEA arrest.  Patient had return of spontaneous circulation.  On heparin drip.  Patient is a DNR.  2. Acute hypoxic and hypercapnic respiratory failure.  Patient extubated 08/07/2019 and placed on nasal cannula..  On cefepime. 3. Acute on chronic systolic congestive heart failure with EF less than 20.  Dialysis to manage fluid. 4. End-stage renal disease on hemodialysis.  Tuesday Thursday Saturday schedule.  For dialysis today 5. Possible seizure on Keppra.  Continue Keppra 500 twice daily.  EEG with diffuse slowing without any sharps.  Neurology recommended reimaging of the brain in a few days.  Patient following commands. 6. History of hypertension 7. History of PAD-outpatient vascular surgery follow-up Plan of care discussed with the patient and intensivist Code Status:     Code Status Orders  (From admission, onward)         Start     Ordered   08/05/19 0653  Do not attempt resuscitation (DNR)  Continuous    Question Answer Comment  In the event of cardiac or respiratory ARREST Do not call a "code blue"   In the event of cardiac or respiratory ARREST Do not perform Intubation, CPR, defibrillation or ACLS   In the event of cardiac or respiratory ARREST Use medication by any route, position, wound care, and other measures to relive pain and suffering. May use oxygen, suction and manual treatment of airway obstruction as needed for comfort.      08/05/19 0160        Code Status History    Date Active Date Inactive Code Status Order ID Comments User Context   08/05/2019 0613 08/05/2019 0652 Full Code [id number]  [name], NP Inpatient   08/04/2019 0127 08/05/2019 0613 Full Code [id number]  Mansy, Arvella Merles, MD ED   07/27/2019 1110 07/29/2019 1719 Full Code [id number]  Hillary Bow, MD ED   07/20/2019 2041 07/23/2019 1929 DNR [id number]  Vaughan Basta, MD Inpatient   07/09/2019 0543 07/12/2019 1502 Full Code [id number]  Harrie Foreman, MD Inpatient    07/02/2019 2128 07/06/2019 1557 Full Code [id number]  Henreitta Leber, MD Inpatient   05/19/2019 0603 05/22/2019 1957 Full Code [id number]  Lance Coon, MD Inpatient   02/28/2019 1805 03/03/2019 2234 Full Code [id number]  Vaughan Basta, MD Inpatient   02/21/2019 0835 02/24/2019 1945 Full Code [id number]  Harrie Foreman, MD Inpatient   02/04/2019 1343 02/09/2019 2050 Full Code [id number]  Saundra Shelling, MD ED   01/27/2019 0654 01/29/2019 2049 Partial Code [id number]  Harrie Foreman, MD Inpatient   01/07/2019 2156 01/27/2019 0638 Partial Code [id number]  Tennis Ship, MD Inpatient   12/25/2018 0158 01/07/2019 2027 Partial Code [id number]  Ina Homes, MD ED   Advance Care Planning Activity     Family Communication: As per critical care specialist Disposition Plan: To be determined  Consultants:  Critical care specialist  Cardiology  Nephrology  Antibiotics:  Cefepime  Time spent: 33 minutes.  Case discussed with respiratory, critical care specialist and nursing staff.  Nicholes Mango  [employer]

## 2019-08-08 NOTE — Progress Notes (Signed)
[facility]  ROUNDING NOTE   Subjective:  Patient was extubated. He is awake and alert and following commands but confused. Due for dialysis today.   Objective:  Vital signs in last 24 hours:  Temp:  [97.7 F (36.5 C)-98.8 F (37.1 C)] 98.4 F (36.9 C) (10/17 1200) Pulse Rate:  [80-102] 101 (10/17 1415) Resp:  [7-21] 13 (10/17 1415) BP: (114-180)/(78-116) 172/114 (10/17 1415) SpO2:  [97 %-100 %] (P) 100 % (10/17 1410) Weight:  [51 kg] 51 kg (10/17 0500)  Weight change: -2.1 kg Filed Weights   08/06/19 1600 08/07/19 0219 08/08/19 0500  Weight: 51.8 kg 49.7 kg 51 kg    Intake/Output: I/O last 3 completed shifts: In: 1284.5 [I.V.:784.5; IV BTYOMAYOK:599] Out: 535 [Urine:435; Emesis/NG output:100]   Intake/Output this shift:  Total I/O In: 164 [I.V.:64; IV Piggyback:100] Out: -   Physical Exam: General: Critically ill-appearing  Head: [place]/AT hearing intact  Eyes: Anicteric  Neck: Supple, trachea midline  Lungs:  Clear to auscultation, normal effort  Heart: S1S2 no rubs  Abdomen:  Soft, nontender, bowel sounds present  Extremities: No peripheral edema.  Neurologic: Will follow simple commands  Skin: No lesions  Access: LUE AVF, Right IJ PC    Basic Metabolic Panel: Recent Labs  Lab 08/04/19 0845  08/05/19 1819 08/06/19 0422 08/06/19 1124 08/06/19 1958 08/07/19 0312 08/08/19 0347  NA 139   < > 137 138  --  135 137 140  K 3.4*   < > 4.7 4.8  --  3.6 3.8 4.7  CL 99   < > 100 103  --  99 102 104  CO2 26   < > 22 19*  --  _0 GLUCOSE 82   < > 98 112*  --  138* 129* 132*  BUN 18   < > 30* 36*  --  17 22* 33*  CREATININE 2.86*   < > 3.44* 3.65*  --  2.16* 2.57* 4.01*  CALCIUM 8.3*   < > 9.1 8.7*  --  8.2* 8.4* 8.9  MG  --   --   --   --   --  1.6* 1.7 2.8*  PHOS 3.9  --   --   --  9.1*  --   --   --    < > = values in this interval not displayed.    Liver Function Tests: Recent Labs  Lab 08/03/19 0925 08/04/19 0845 08/05/19 0431  08/06/19 0422  AST 28  --  117* 79*  ALT 11  --  67* 66*  ALKPHOS 73  --  94 75  BILITOT 1.5*  --  1.2 1.7*  PROT 6.4*  --  6.2* 5.8*  ALBUMIN 2.9* 2.7* 2.6* 2.4*   No results for input(s): LIPASE, AMYLASE in the last 168 hours. No results for input(s): AMMONIA in the last 168 hours.  CBC: Recent Labs  Lab 08/04/19 0349 08/05/19 0431 08/05/19 1132 08/05/19 1819 08/06/19 0422 08/06/19 1958 08/07/19 0312 08/08/19 0347  WBC 10.1 12.3* 12.1* 8.0 6.9 8.2 7.4 11.2*  NEUTROABS 8.0* 10.7* 9.9* 7.1  --  7.0  --   --   HGB 9.2* 8.2* 8.2* 8.3* 8.0* 7.4* 7.7* 7.8*  HCT 28.8* 26.8* 26.3* 26.3* 25.0* 22.8* 23.5* 25.3*  MCV 100.0 104.3* 102.7* 102.3* 99.6 97.9 97.1 102.8*  PLT 150 135* 134* 122* 116* 124* 122* 131*    Cardiac Enzymes: No results for input(s): CKTOTAL, CKMB, CKMBINDEX, TROPONINI in the last 168  hours.  BNP: Invalid input(s): POCBNP  CBG: Recent Labs  Lab 08/07/19 2008 08/07/19 2339 08/08/19 0345 08/08/19 0748 08/08/19 1107  GLUCAP 119* 114* 120* 120* 120*    Microbiology: Results for orders placed or performed during the hospital encounter of 08/03/19  SARS Coronavirus 2 by RT PCR (hospital order, performed in [facility] hospital lab) Nasopharyngeal Nasopharyngeal Swab     Status: None   Collection Time: 08/03/19  9:25 AM   Specimen: Nasopharyngeal Swab  Result Value Ref Range Status   SARS Coronavirus 2 NEGATIVE NEGATIVE Final    Comment: (NOTE) If result is NEGATIVE SARS-CoV-2 target nucleic acids are NOT DETECTED. The SARS-CoV-2 RNA is generally detectable in upper and lower  respiratory specimens during the acute phase of infection. The lowest  concentration of SARS-CoV-2 viral copies this assay can detect is 250  copies / mL. A negative result does not preclude SARS-CoV-2 infection  and should not be used as the sole basis for treatment or other  patient management decisions.  A negative result may occur with  improper specimen collection /  handling, submission of specimen other  than nasopharyngeal swab, presence of viral mutation(s) within the  areas targeted by this assay, and inadequate number of viral copies  (<250 copies / mL). A negative result must be combined with clinical  observations, patient history, and epidemiological information. If result is POSITIVE SARS-CoV-2 target nucleic acids are DETECTED. The SARS-CoV-2 RNA is generally detectable in upper and lower  respiratory specimens dur ing the acute phase of infection.  Positive  results are indicative of active infection with SARS-CoV-2.  Clinical  correlation with patient history and other diagnostic information is  necessary to determine patient infection status.  Positive results do  not rule out bacterial infection or co-infection with other viruses. If result is PRESUMPTIVE POSTIVE SARS-CoV-2 nucleic acids MAY BE PRESENT.   A presumptive positive result was obtained on the submitted specimen  and confirmed on repeat testing.  While 2019 novel coronavirus  (SARS-CoV-2) nucleic acids may be present in the submitted sample  additional confirmatory testing may be necessary for epidemiological  and / or clinical management purposes  to differentiate between  SARS-CoV-2 and other Sarbecovirus currently known to infect humans.  If clinically indicated additional testing with an alternate test  methodology [phone]) is advised. The SARS-CoV-2 RNA is generally  detectable in upper and lower respiratory sp ecimens during the acute  phase of infection. The expected result is Negative. Fact Sheet for Patients:  StrictlyIdeas.no Fact Sheet for Healthcare Providers: BankingDealers.co.za This test is not yet approved or cleared by the [place] FDA and has been authorized for detection and/or diagnosis of SARS-CoV-2 by FDA under an Emergency Use Authorization (EUA).  This EUA will remain in effect (meaning this  test can be used) for the duration of the COVID-19 declaration under Section 564(b)(1) of the Act, 21 U.S.C. section 360bbb-3(b)(1), unless the authorization is terminated or revoked sooner. Performed at [facility], [address] [place], [place] [address]   Blood culture (routine x 2)     Status: None   Collection Time: 08/03/19 10:05 AM   Specimen: BLOOD  Result Value Ref Range Status   Specimen Description BLOOD BLOOD RIGHT FOREARM  Final   Special Requests   Final    BOTTLES DRAWN AEROBIC AND ANAEROBIC Blood Culture adequate volume   Culture   Final    NO GROWTH 5 DAYS Performed at [facility], [address] [place], [place] [address]    Report Status 08/08/2019 FINAL  Final  MRSA PCR Screening     Status: None   Collection Time: 08/04/19  7:56 AM   Specimen: Nasopharyngeal  Result Value Ref Range Status   MRSA by PCR NEGATIVE NEGATIVE Final    Comment:        The GeneXpert MRSA Assay (FDA approved for NASAL specimens only), is one component of a comprehensive MRSA colonization surveillance program. It is not intended to diagnose MRSA infection nor to guide or monitor treatment for MRSA infections. Performed at [facility], [address] [place], [place] [address]   Urine Culture     Status: None   Collection Time: 08/05/19 10:14 AM   Specimen: Urine, Random  Result Value Ref Range Status   Specimen Description   Final    URINE, RANDOM Performed at [facility], [address] [place], [name] [address]    Special Requests   Final    NONE Performed at [facility], [address], [place], [place] [address]    Culture   Final    NO GROWTH Performed at [facility], [place] [address] [place], [place] [address]    Report Status 08/06/2019 FINAL  Final  Culture, respiratory (non-expectorated)     Status: None   Collection Time: 08/05/19  8:40 PM   Specimen: Tracheal Aspirate; Respiratory  Result  Value Ref Range Status   Specimen Description   Final    TRACHEAL ASPIRATE Performed at [facility], [address] [place], [place] [address]    Special Requests   Final    Normal Performed at [facility], [address] [place], [place] [address]    Gram Stain   Final    FEW WBC PRESENT,BOTH PMN AND MONONUCLEAR NO ORGANISMS SEEN    Culture   Final    RARE Consistent with normal respiratory flora. Performed at [facility], [place] [address] [place], [place] [address]    Report Status 08/08/2019 FINAL  Final    Coagulation Studies: No results for input(s): LABPROT, INR in the last 72 hours.  Urinalysis: Recent Labs    08/05/19 2049  COLORURINE AMBER*  LABSPEC 1.023  PHURINE 6.0  GLUCOSEU NEGATIVE  HGBUR MODERATE*  BILIRUBINUR NEGATIVE  KETONESUR NEGATIVE  PROTEINUR 100*  NITRITE NEGATIVE  LEUKOCYTESUR SMALL*      Imaging: No results found.   Medications:   . sodium chloride    . sodium chloride    . sodium chloride    . sodium chloride    . ceFEPime (MAXIPIME) IV 2 g (08/08/19 1208)  . famotidine (PEPCID) IV Stopped (08/07/19 1839)  . heparin 800 Units/hr (08/08/19 1200)  . levETIRAcetam Stopped (08/08/19 0857)  . norepinephrine (LEVOPHED) Adult infusion Stopped (08/07/19 1505)   . aspirin  81 mg Per Tube Daily  . chlorhexidine gluconate (MEDLINE KIT)  15 mL Mouth Rinse BID  . Chlorhexidine Gluconate Cloth  6 each Topical Q0600  . epoetin (EPOGEN/PROCRIT) injection  10,000 Units Intravenous Q T,Th,Sa-HD  . hydrocortisone sod succinate (SOLU-CORTEF) inj  100 mg Intravenous Q12H  . insulin aspart  0-9 Units Subcutaneous Q4H  . mouth rinse  15 mL Mouth Rinse 10 times per day  . pantoprazole (PROTONIX) IV  40 mg Intravenous QHS  . sodium chloride flush  3 mL Intravenous Q12H   sodium chloride, sodium chloride, sodium chloride, acetaminophen, albuterol, alteplase, heparin, labetalol, lidocaine (PF), lidocaine-prilocaine,  nitroGLYCERIN, ondansetron (ZOFRAN) IV, pentafluoroprop-tetrafluoroeth, promethazine, sodium chloride flush  Assessment/ Plan:  43 y.o. male with end-stage renal disease on hemodialysis, hypertension, peripheral vascular disease, depression.  [place] TTS    1.  ESRD on HD TTS.  Patient due for dialysis today.  Orders have been prepared.  2.  Acute respiratory failure/PEA arrest.  Patient appears to be doing well post extubation.  Following commands.  3.  Anemia of chronic kidney disease.  Continue Epogen 10,000 IV with dialysis.  4.  Secondary hyperparathyroidism.  Repeat serum phosphorus today.   LOS: 4 Brett Small 10/17/20202:24 PM

## 2019-08-09 LAB — GLUCOSE, CAPILLARY
Glucose-Capillary: 103 mg/dL — ABNORMAL HIGH (ref 70–99)
Glucose-Capillary: 108 mg/dL — ABNORMAL HIGH (ref 70–99)
Glucose-Capillary: 122 mg/dL — ABNORMAL HIGH (ref 70–99)
Glucose-Capillary: 123 mg/dL — ABNORMAL HIGH (ref 70–99)
Glucose-Capillary: 130 mg/dL — ABNORMAL HIGH (ref 70–99)
Glucose-Capillary: 137 mg/dL — ABNORMAL HIGH (ref 70–99)

## 2019-08-09 LAB — BASIC METABOLIC PANEL
Anion gap: 12 (ref 5–15)
BUN: 20 mg/dL (ref 6–20)
CO2: 27 mmol/L (ref 22–32)
Calcium: 8.6 mg/dL — ABNORMAL LOW (ref 8.9–10.3)
Chloride: 100 mmol/L (ref 98–111)
Creatinine, Ser: 2.65 mg/dL — ABNORMAL HIGH (ref 0.61–1.24)
GFR calc Af Amer: 32 mL/min — ABNORMAL LOW (ref >60)
GFR calc non Af Amer: 28 mL/min — ABNORMAL LOW (ref >60)
Glucose, Bld: 127 mg/dL — ABNORMAL HIGH (ref 70–99)
Potassium: 3.4 mmol/L — ABNORMAL LOW (ref 3.5–5.1)
Sodium: 139 mmol/L (ref 135–145)

## 2019-08-09 LAB — TROPONIN I (HIGH SENSITIVITY): Troponin I (High Sensitivity): 127 ng/L (ref <18)

## 2019-08-09 LAB — APTT: aPTT: 71 seconds — ABNORMAL HIGH (ref 24–36)

## 2019-08-09 LAB — CBC
HCT: 25.3 % — ABNORMAL LOW (ref 39.0–52.0)
Hemoglobin: 8.1 g/dL — ABNORMAL LOW (ref 13.0–17.0)
MCH: 32.4 pg (ref 26.0–34.0)
MCHC: 32 g/dL (ref 30.0–36.0)
MCV: 101.2 fL — ABNORMAL HIGH (ref 80.0–100.0)
Platelets: 140 10*3/uL — ABNORMAL LOW (ref 150–400)
RBC: 2.5 MIL/uL — ABNORMAL LOW (ref 4.22–5.81)
RDW: 17.8 % — ABNORMAL HIGH (ref 11.5–15.5)
WBC: 11.7 10*3/uL — ABNORMAL HIGH (ref 4.0–10.5)
nRBC: 0 % (ref 0.0–0.2)

## 2019-08-09 LAB — PHOSPHORUS: Phosphorus: 3.8 mg/dL (ref 2.5–4.6)

## 2019-08-09 LAB — MAGNESIUM: Magnesium: 1.9 mg/dL (ref 1.7–2.4)

## 2019-08-09 MED ORDER — POTASSIUM CHLORIDE 10 MEQ/100ML IV SOLN
10.0000 meq | Freq: Once | INTRAVENOUS | Status: AC
  Administered 2019-08-09: 10 meq via INTRAVENOUS
  Filled 2019-08-09: qty 100

## 2019-08-09 NOTE — Consult Note (Signed)
[facility] for Electrolyte Monitoring and Replacement   Recent Labs: Potassium (mmol/L)  Date Value  08/09/2019 3.4 (L)   Magnesium (mg/dL)  Date Value  08/09/2019 1.9   Calcium (mg/dL)  Date Value  08/09/2019 8.6 (L)   Albumin (g/dL)  Date Value  08/06/2019 2.4 (L)   Phosphorus (mg/dL)  Date Value  08/09/2019 3.8   Sodium (mmol/L)  Date Value  08/09/2019 139   Corrected Ca: 9.68 mg/dL  Assessment: 43 y.o. male admitted on 08/03/2019 with sepsis. On 10/14, pt became suddenly altered and then unresponsive with agonal respirations, leading to respiratory arrest with bradycardia and eventual PEA arrest requiring approximately 10 minutes of CPR/ACLS before ROSC was achieved. Pt was intubated during arrest and remains intubated and sedated.  Goal of Therapy:  Due to cardiac history: Potassium 4.0 - 5.1 mmol/L Magnesium 2.0 - 2.4 mg/dL Other electrolytes WNL  Plan:  Will give potassium 10 mEq IV x 1 considering ESRD on HD.  Tawnya Crook ,PharmD Clinical Pharmacist 08/09/2019 8:01 AM

## 2019-08-09 NOTE — Progress Notes (Signed)
Name: Brett Small MRN: [id number] DOB: 07/26/1976     CONSULTATION DATE: 08/05/2019  CHIEF COMPLAINT:  Cardiac arrest  STUDIES/SIGNIFICANT EVENTS: 10/13 - admitted as telemetry patient 10/14 - cardiac arrest 10/14 - CT Head negative 10/14 - targeted temperature management initiated 10/14 - cardiology and neurology consulted; patient made DNR 10/15-purposeful movements noted on exam today when weaning sedation 10/16-patient has some purposeful movements, will perform weaning trial today   10/17 - patient clinically improved, will advance diet to clears today  10/18- optimizing for downgrade  HISTORY OF PRESENT ILLNESS:   Brett Small is a 43 yo male with a past medical history of ESRD on hemodialysis, hypertension, peripheral vascular disease, and CHF with EF <20% who presented to the ED complaining of shortness of breath, dry cough, and wheezing. He was placed on BiPAP and admitted to the hospital for dialysis for fluid overload and flash pulmonary edema. Pt was then weaned off BiPAP.  On 10/14, pt became suddenly altered and then unresponsive with agonal respirations, leading to respiratory arrest with bradycardia and eventual PEA arrest requiring approximately 10 minutes of CPR/ACLS before ROSC was achieved. Pt was intubated during arrest. Post arrest, pt was still unresponsive with decorticate posturing to pain stimuli. He was placed on targeted temperature management to 36 C and placed on a heparin drip because of possibility of PE given significantly elevated D dimer.   PAST MEDICAL HISTORY :   has a past medical history of ESRD (end stage renal disease) ([place]), Hypertension, PAD (peripheral artery disease) ([place]), Peripheral vascular disease ([place]), Renal disorder, and Secondary hyperparathyroidism of renal origin ([place]).  has a past surgical history that includes Aorta - femoral artery bypass graft; AV fistula placement (Left, 12/26/2018); A/V SHUNT INTERVENTION (Left, 01/19/2019);  DIALYSIS/PERMA CATHETER REMOVAL (N/A, 02/06/2019); A/V SHUNT INTERVENTION (Left, 03/02/2019); LEFT HEART CATH AND CORONARY ANGIOGRAPHY (Right, 07/10/2019); and PERIPHERAL VASCULAR THROMBECTOMY (Left, 07/28/2019). Prior to Admission medications   Medication Sig Start Date End Date Taking? Authorizing Provider  amLODipine (NORVASC) 10 MG tablet Take 1 tablet (10 mg total) by mouth daily at 8 pm. 02/24/19  Yes Gladstone Lighter, MD  apixaban (ELIQUIS) 2.5 MG TABS tablet Take 1 tablet (2.5 mg total) by mouth 2 (two) times daily. 01/26/19  Yes Clapacs, Madie Reno, MD  aspirin EC 81 MG tablet Take 1 tablet (81 mg total) by mouth daily. 01/26/19  Yes Clapacs, Madie Reno, MD  atorvastatin (LIPITOR) 80 MG tablet Take 1 tablet (80 mg total) by mouth at bedtime. 01/26/19  Yes Clapacs, Madie Reno, MD  b complex-C-folic acid 1 MG capsule Take 1 capsule by mouth daily after supper.    Yes [provider]  carvedilol (COREG) 25 MG tablet Take 1 tablet (25 mg total) by mouth 2 (two) times daily with a meal. 01/26/19  Yes Clapacs, Madie Reno, MD  gabapentin (NEURONTIN) 100 MG capsule Take 1 capsule (100 mg total) by mouth 3 (three) times daily. 01/26/19  Yes Clapacs, Madie Reno, MD  hydrALAZINE (APRESOLINE) 25 MG tablet Take 1 tablet (25 mg total) by mouth every 8 (eight) hours. 01/26/19  Yes Clapacs, Madie Reno, MD  hydrOXYzine (ATARAX/VISTARIL) 25 MG tablet Take 25 mg by mouth 3 (three) times daily as needed for anxiety.   Yes [provider]  loratadine (CLARITIN) 10 MG tablet Take 1 tablet (10 mg total) by mouth daily. 03/04/19  Yes Salary, Avel Peace, MD  multivitamin (RENA-VIT) TABS tablet Take 1 tablet by mouth daily. 01/27/19  Yes Clapacs, Madie Reno,  MD  neomycin-bacitracin-polymyxin (NEOSPORIN) OINT Apply 1 application topically 2 (two) times daily. 01/26/19  Yes Clapacs, Madie Reno, MD  Nutritional Supplements (FEEDING SUPPLEMENT, NEPRO CARB STEADY,) LIQD Take 237 mLs by mouth 2 (two) times daily between meals. 07/06/19  Yes Epifanio Lesches, MD  sevelamer carbonate (RENVELA) 800 MG tablet Take 2 tablets (1,600 mg total) by mouth 3 (three) times daily with meals. 01/26/19  Yes Clapacs, Madie Reno, MD   Allergies  Allergen Reactions  . Codeine Nausea Only    Patient questioned this (??)  . Sulfa Antibiotics Hives and Nausea And Vomiting    REVIEW OF SYSTEMS:   Unable to obtain due to critical illness   VITAL SIGNS: Temp:  [98.3 F (36.8 C)-98.7 F (37.1 C)] 98.6 F (37 C) (10/18 0900) Pulse Rate:  [86-114] 102 (10/18 0914) Resp:  [10-25] 10 (10/18 0914) BP: (132-180)/(83-117) 142/98 (10/18 0914) SpO2:  [91 %-100 %] 100 % (10/18 0914) Weight:  [50.4 kg] 50.4 kg (10/18 0500)   I/O last 3 completed shifts: In: 969.9 [P.O.:240; I.V.:284.9; IV Piggyback:445] Out: 1000 [Other:1000] Total I/O In: 243 [P.O.:240; I.V.:3] Out: -    SpO2: 100 % O2 Flow Rate (L/min): 2 L/min FiO2 (%): 24 %      Physical Examination:  GENERAL: Chronically ill appearing,NAD HEAD: Normocephalic, atraumatic.  EYES: Fixed pupils of grossly different sizes. No scleral icterus.  MOUTH: Moist mucosal membrane. NECK: Supple. No JVD.  PULMONARY: lungs clear to auscultation CARDIOVASCULAR: S1 and S2. Regular rate and rhythm. No murmurs, rubs, or gallops.  GASTROINTESTINAL: Soft, nontender, -distended. No masses. Positive bowel sounds. No hepatosplenomegaly.  MUSCULOSKELETAL: No swelling, clubbing, or edema.  NEUROLOGIC: Sedated on mechanical ventilation SKIN:intact,warm,dry  I personally reviewed lab work that was obtained in last 24 hrs. CXR Independently reviewed  MEDICATIONS: I have reviewed all medications and confirmed regimen as documented   CULTURE RESULTS   Recent Results (from the past 240 hour(s))  SARS Coronavirus 2 by RT PCR (hospital order, performed in [facility] hospital lab) Nasopharyngeal Nasopharyngeal Swab     Status: None   Collection Time: 08/03/19  9:25 AM   Specimen: Nasopharyngeal Swab  Result Value  Ref Range Status   SARS Coronavirus 2 NEGATIVE NEGATIVE Final    Comment: (NOTE) If result is NEGATIVE SARS-CoV-2 target nucleic acids are NOT DETECTED. The SARS-CoV-2 RNA is generally detectable in upper and lower  respiratory specimens during the acute phase of infection. The lowest  concentration of SARS-CoV-2 viral copies this assay can detect is 250  copies / mL. A negative result does not preclude SARS-CoV-2 infection  and should not be used as the sole basis for treatment or other  patient management decisions.  A negative result may occur with  improper specimen collection / handling, submission of specimen other  than nasopharyngeal swab, presence of viral mutation(s) within the  areas targeted by this assay, and inadequate number of viral copies  (<250 copies / mL). A negative result must be combined with clinical  observations, patient history, and epidemiological information. If result is POSITIVE SARS-CoV-2 target nucleic acids are DETECTED. The SARS-CoV-2 RNA is generally detectable in upper and lower  respiratory specimens dur ing the acute phase of infection.  Positive  results are indicative of active infection with SARS-CoV-2.  Clinical  correlation with patient history and other diagnostic information is  necessary to determine patient infection status.  Positive results do  not rule out bacterial infection or co-infection with other viruses. If result is  PRESUMPTIVE POSTIVE SARS-CoV-2 nucleic acids MAY BE PRESENT.   A presumptive positive result was obtained on the submitted specimen  and confirmed on repeat testing.  While 2019 novel coronavirus  (SARS-CoV-2) nucleic acids may be present in the submitted sample  additional confirmatory testing may be necessary for epidemiological  and / or clinical management purposes  to differentiate between  SARS-CoV-2 and other Sarbecovirus currently known to infect humans.  If clinically indicated additional testing with an  alternate test  methodology [phone]) is advised. The SARS-CoV-2 RNA is generally  detectable in upper and lower respiratory sp ecimens during the acute  phase of infection. The expected result is Negative. Fact Sheet for Patients:  StrictlyIdeas.no Fact Sheet for Healthcare Providers: BankingDealers.co.za This test is not yet approved or cleared by the [place] FDA and has been authorized for detection and/or diagnosis of SARS-CoV-2 by FDA under an Emergency Use Authorization (EUA).  This EUA will remain in effect (meaning this test can be used) for the duration of the COVID-19 declaration under Section 564(b)(1) of the Act, 21 U.S.C. section 360bbb-3(b)(1), unless the authorization is terminated or revoked sooner. Performed at [facility], [address] [place], [place] [address]   Blood culture (routine x 2)     Status: None   Collection Time: 08/03/19 10:05 AM   Specimen: BLOOD  Result Value Ref Range Status   Specimen Description BLOOD BLOOD RIGHT FOREARM  Final   Special Requests   Final    BOTTLES DRAWN AEROBIC AND ANAEROBIC Blood Culture adequate volume   Culture   Final    NO GROWTH 5 DAYS Performed at [facility], [address] [place], [place] [address]    Report Status 08/08/2019 FINAL  Final  MRSA PCR Screening     Status: None   Collection Time: 08/04/19  7:56 AM   Specimen: Nasopharyngeal  Result Value Ref Range Status   MRSA by PCR NEGATIVE NEGATIVE Final    Comment:        The GeneXpert MRSA Assay (FDA approved for NASAL specimens only), is one component of a comprehensive MRSA colonization surveillance program. It is not intended to diagnose MRSA infection nor to guide or monitor treatment for MRSA infections. Performed at [facility], [address], [place], [place] [address]   Urine Culture     Status: None   Collection Time: 08/05/19 10:14 AM   Specimen:  Urine, Random  Result Value Ref Range Status   Specimen Description   Final    URINE, RANDOM Performed at [facility], [address] [place], [place] [address]    Special Requests   Final    NONE Performed at [facility], [address], [place], [place] [address]    Culture   Final    NO GROWTH Performed at [facility], [place] [address] [place], [place] [address]    Report Status 08/06/2019 FINAL  Final  Culture, respiratory (non-expectorated)     Status: None   Collection Time: 08/05/19  8:40 PM   Specimen: Tracheal Aspirate; Respiratory  Result Value Ref Range Status   Specimen Description   Final    TRACHEAL ASPIRATE Performed at [facility], [address] [place], [place] [address]    Special Requests   Final    Normal Performed at [facility], [address] [place], [place] [address]    Gram Stain   Final    FEW WBC PRESENT,BOTH PMN  AND MONONUCLEAR NO ORGANISMS SEEN    Culture   Final    RARE Consistent with normal respiratory flora. Performed at [facility], [place] [address] [place], [place] [address]    Report Status 08/08/2019 FINAL  Final          IMAGING    No results found.      Indwelling Urinary Catheter continued, requirement due to   Reason to continue Indwelling Urinary Catheter strict Intake/Output monitoring for hemodynamic instability   Central Line/ continued, requirement due to  Reason to continue [place] of central venous pressure or other hemodynamic parameters and poor IV access   Ventilator continued, requirement due to severe respiratory failure   Ventilator Sedation RASS 0 to -2     ASSESSMENT AND PLAN  43 yo male with ESRD on HD and CHF with EF <20% admitted to the ICU with acute hypoxic respiratory distress secondary to flash pulmonary edema from CHF exacerbation. Pt had respiratory and eventual cardiac arrest in ICU with PEA that required ACLS  resuscitation secondary to differential of PE vs CHF with volume overload vs pneumonia.  Severe ACUTE Hypoxic and Hypercapnic Respiratory Failure-RESOLVED Post cardiac arrest  -significantly improved - Lucid & appropriate   Cardiac arrest, with cardiogenic shock-RESOLVED ROSC achieved; pt now DNR - S/P -TTM at [facility]-   ACUTE on [place]- EF <20% -oxygen as needed -Lasix as tolerated   End stage renal disease on HD -follow BMP -follow UO -continue Foley Catheter-assess need -Avoid nephrotoxic agents -Nephrology consulted, follow up on recs  NEUROLOGY - almost to baseline as per patient  CARDIAC ICU monitoring  ID -continue IV abx as prescibed -follow up cultures  GI GI PROPHYLAXIS as indicated  NUTRITIONAL STATUS DIET-->TF's as tolerated Constipation protocol as indicated  ENDO - will use ICU hypoglycemic\Hyperglycemia protocol if needed  ELECTROLYTES -follow labs as needed -replace as needed -pharmacy consultation and following  DVT/GI PRX ordered TRANSFUSIONS AS NEEDED MONITOR FSBS ASSESS the need for LABS      Ottie Glazier, M.D.  Pulmonary & [place]    Critical care provider statement:    Critical care time (minutes):  33   Critical care time was exclusive of:  Separately billable procedures and  treating other patients   Critical care was necessary to treat or prevent imminent or  life-threatening deterioration of the following conditions:   Cardiac arrest, acute hypoxemic respiratory failure, multiple comorbid conditions   Critical care was time spent personally by me on the following  activities:  Development of treatment plan with patient or surrogate,  discussions with consultants, evaluation of patient's response to  treatment, examination of patient, obtaining history from patient or  surrogate, ordering and performing treatments and interventions, ordering  and review of  laboratory studies and re-evaluation of patient's condition   I assumed direction of critical care for this patient from another  provider in my specialty: no

## 2019-08-09 NOTE — Progress Notes (Signed)
Patient ID: Brett Small, male   DOB: [birth year], 43 y.o.   MRN: [id number]  [employer] [facility] PROGRESS NOTE  Dustin Goette [id number] DOB: 1976/06/06 DOA: 08/03/2019 PCP: Patient, [name]  HPI/Subjective: Patient was extubated 08/07/2019.  VQ scan ordered to rule out pulmonary embolism, feel slightly better today but still feeling weak, did not complain of any shortness of breath or chest pain.    Objective: Vitals:   08/09/19 0900 08/09/19 0914  BP: (!) 160/117 (!) 142/98  Pulse: (!) 114 (!) 102  Resp: 10 10  Temp: 98.6 F (37 C)   SpO2: 100% 100%    Intake/Output Summary (Last 24 hours) at 08/09/2019 1301 Last data filed at 08/09/2019 1012 Gross per 24 hour  Intake 878.94 ml  Output 1000 ml  Net -121.06 ml   Filed Weights   08/07/19 0219 08/08/19 0500 08/09/19 0500  Weight: 49.7 kg 51 kg 50.4 kg    ROS: Review of Systems  Unable to perform ROS: Acuity of condition  Respiratory: Negative for shortness of breath.   Cardiovascular: Negative for chest pain.  Gastrointestinal: Negative for abdominal pain.   Exam: Physical Exam  HENT:  Mouth/Throat: No oropharyngeal exudate.  Unable to look on the mouth  Eyes: Pupils are equal, round, and reactive to light. Conjunctivae and lids are normal.  Neck: Trachea normal. Carotid bruit is not present.  Cardiovascular: Regular rhythm, S1 normal, S2 normal and normal heart sounds.  Respiratory: He has decreased breath sounds in the right lower field and the left lower field. He has no wheezes. He has rhonchi in the right lower field and the left lower field. He has no rales.  GI: Soft. Bowel sounds are normal. There is no abdominal tenderness.  Musculoskeletal:     Right ankle: He exhibits no swelling.     Left ankle: He exhibits no swelling.  Neurological: He is alert.  Follows commands and able to move all extremities.  Skin: Skin is warm. No rash noted. Nails show no clubbing.  Psychiatric: He has a normal mood and  affect.      Data Reviewed: Basic Metabolic Panel: Recent Labs  Lab 08/04/19 0845  08/06/19 0422 08/06/19 1124 08/06/19 1958 08/07/19 0312 08/08/19 0347 08/09/19 0438  NA 139   < > 138  --  135 137 140 139  K 3.4*   < > 4.8  --  3.6 3.8 4.7 3.4*  CL 99   < > 103  --  99 102 104 100  CO2 26   < > 19*  --  26 27 25 27   GLUCOSE 82   < > 112*  --  138* 129* 132* 127*  BUN 18   < > 36*  --  17 22* 33* 20  CREATININE 2.86*   < > 3.65*  --  2.16* 2.57* 4.01* 2.65*  CALCIUM 8.3*   < > 8.7*  --  8.2* 8.4* 8.9 8.6*  MG  --   --   --   --  1.6* 1.7 2.8* 1.9  PHOS 3.9  --   --  9.1*  --   --   --  3.8   < > = values in this interval not displayed.   Liver Function Tests: Recent Labs  Lab 08/03/19 0925 08/04/19 0845 08/05/19 0431 08/06/19 0422  AST 28  --  117* 79*  ALT 11  --  67* 66*  ALKPHOS 73  --  94 75  BILITOT 1.5*  --  1.2 1.7*  PROT 6.4*  --  6.2* 5.8*  ALBUMIN 2.9* 2.7* 2.6* 2.4*   CBC: Recent Labs  Lab 08/04/19 0349 08/05/19 0431 08/05/19 1132 08/05/19 1819 08/06/19 0422 08/06/19 1958 08/07/19 0312 08/08/19 0347 08/09/19 0438  WBC 10.1 12.3* 12.1* 8.0 6.9 8.2 7.4 11.2* 11.7*  NEUTROABS 8.0* 10.7* 9.9* 7.1  --  7.0  --   --   --   HGB 9.2* 8.2* 8.2* 8.3* 8.0* 7.4* 7.7* 7.8* 8.1*  HCT 28.8* 26.8* 26.3* 26.3* 25.0* 22.8* 23.5* 25.3* 25.3*  MCV 100.0 104.3* 102.7* 102.3* 99.6 97.9 97.1 102.8* 101.2*  PLT 150 135* 134* 122* 116* 124* 122* 131* 140*   BNP (last 3 results) Recent Labs    07/20/19 1029 08/03/19 0925 08/05/19 0431  BNP >4,500.0* >4,500.0* 3,772.0*     CBG: Recent Labs  Lab 08/08/19 1936 08/09/19 0000 08/09/19 0336 08/09/19 0729 08/09/19 1125  GLUCAP 142* 103* 122* 137* 130*    Recent Results (from the past 240 hour(s))  SARS Coronavirus 2 by RT PCR (hospital order, performed in [facility] hospital lab) Nasopharyngeal Nasopharyngeal Swab     Status: None   Collection Time: 08/03/19  9:25 AM   Specimen: Nasopharyngeal Swab   Result Value Ref Range Status   SARS Coronavirus 2 NEGATIVE NEGATIVE Final    Comment: (NOTE) If result is NEGATIVE SARS-CoV-2 target nucleic acids are NOT DETECTED. The SARS-CoV-2 RNA is generally detectable in upper and lower  respiratory specimens during the acute phase of infection. The lowest  concentration of SARS-CoV-2 viral copies this assay can detect is 250  copies / mL. A negative result does not preclude SARS-CoV-2 infection  and should not be used as the sole basis for treatment or other  patient management decisions.  A negative result may occur with  improper specimen collection / handling, submission of specimen other  than nasopharyngeal swab, presence of viral mutation(s) within the  areas targeted by this assay, and inadequate number of viral copies  (<250 copies / mL). A negative result must be combined with clinical  observations, patient history, and epidemiological information. If result is POSITIVE SARS-CoV-2 target nucleic acids are DETECTED. The SARS-CoV-2 RNA is generally detectable in upper and lower  respiratory specimens dur ing the acute phase of infection.  Positive  results are indicative of active infection with SARS-CoV-2.  Clinical  correlation with patient history and other diagnostic information is  necessary to determine patient infection status.  Positive results do  not rule out bacterial infection or co-infection with other viruses. If result is PRESUMPTIVE POSTIVE SARS-CoV-2 nucleic acids MAY BE PRESENT.   A presumptive positive result was obtained on the submitted specimen  and confirmed on repeat testing.  While 2019 novel coronavirus  (SARS-CoV-2) nucleic acids may be present in the submitted sample  additional confirmatory testing may be necessary for epidemiological  and / or clinical management purposes  to differentiate between  SARS-CoV-2 and other Sarbecovirus currently known to infect humans.  If clinically indicated additional  testing with an alternate test  methodology [phone]) is advised. The SARS-CoV-2 RNA is generally  detectable in upper and lower respiratory sp ecimens during the acute  phase of infection. The expected result is Negative. Fact Sheet for Patients:  StrictlyIdeas.no Fact Sheet for Healthcare Providers: BankingDealers.co.za This test is not yet approved or cleared by the [place] FDA and has been authorized for detection and/or diagnosis of SARS-CoV-2 by FDA under an Emergency Use Authorization (EUA).  This  EUA will remain in effect (meaning this test can be used) for the duration of the COVID-19 declaration under Section 564(b)(1) of the Act, 21 U.S.C. section 360bbb-3(b)(1), unless the authorization is terminated or revoked sooner. Performed at [facility], [address] [place], [place] [address]   Blood culture (routine x 2)     Status: None   Collection Time: 08/03/19 10:05 AM   Specimen: BLOOD  Result Value Ref Range Status   Specimen Description BLOOD BLOOD RIGHT FOREARM  Final   Special Requests   Final    BOTTLES DRAWN AEROBIC AND ANAEROBIC Blood Culture adequate volume   Culture   Final    NO GROWTH 5 DAYS Performed at [facility], [address] [place], [place] [address]    Report Status 08/08/2019 FINAL  Final  MRSA PCR Screening     Status: None   Collection Time: 08/04/19  7:56 AM   Specimen: Nasopharyngeal  Result Value Ref Range Status   MRSA by PCR NEGATIVE NEGATIVE Final    Comment:        The GeneXpert MRSA Assay (FDA approved for NASAL specimens only), is one component of a comprehensive MRSA colonization surveillance program. It is not intended to diagnose MRSA infection nor to guide or monitor treatment for MRSA infections. Performed at [facility], [address] [place], [place] [address]   Urine Culture     Status: None   Collection Time: 08/05/19 10:14 AM    Specimen: Urine, Random  Result Value Ref Range Status   Specimen Description   Final    URINE, RANDOM Performed at [facility], [address], [place], [place] [address]    Special Requests   Final    NONE Performed at [facility], [address], [place], [place] [address]    Culture   Final    NO GROWTH Performed at [facility], [place] [address], [place], [place] [address]    Report Status 08/06/2019 FINAL  Final  Culture, respiratory (non-expectorated)     Status: None   Collection Time: 08/05/19  8:40 PM   Specimen: Tracheal Aspirate; Respiratory  Result Value Ref Range Status   Specimen Description   Final    TRACHEAL ASPIRATE Performed at [facility], [address] [place], [place] [address]    Special Requests   Final    Normal Performed at [facility], [address] [place], [place] [address]    Gram Stain   Final    FEW WBC PRESENT,BOTH PMN AND MONONUCLEAR NO ORGANISMS SEEN    Culture   Final    RARE Consistent with normal respiratory flora. Performed at [facility], [place] [address], [place], [place] [address]    Report Status 08/08/2019 FINAL  Final     Studies: No results found.  Scheduled Meds: . aspirin  81 mg Per Tube Daily  . Chlorhexidine Gluconate Cloth  6 each Topical Q0600  . epoetin (EPOGEN/PROCRIT) injection  10,000 Units Intravenous Q T,Th,Sa-HD  . hydrocortisone sod succinate (SOLU-CORTEF) inj  100 mg Intravenous Q12H  . insulin aspart  0-9 Units Subcutaneous Q4H  . pantoprazole (PROTONIX) IV  40 mg Intravenous QHS   Continuous Infusions: . sodium chloride    . sodium chloride    . sodium chloride    . ceFEPime (MAXIPIME) IV Stopped (08/08/19 1239)  . famotidine (PEPCID) IV Stopped (08/08/19 1850)  . heparin 800 Units/hr (08/09/19 0700)  .  levETIRAcetam 500 mg (08/09/19 0953)    Assessment/Plan:  1. PEA arrest.  Patient had return of spontaneous circulation following  ACLS.  On heparin drip.  Patient is a DNR.  2. Acute hypoxic and hypercapnic respiratory failure.  Patient extubated 08/07/2019 and placed on nasal cannula..  On cefepime.  VQ scan ordered to rule out pulmonary embolism, we will continue heparin drip 3. Acute on chronic systolic congestive heart failure with EF less than 20.  Dialysis to manage fluid. 4. End-stage renal disease on hemodialysis.  Tuesday Thursday Saturday schedule.  Dialysis was done yesterday.  Creatinine 4-2.65 5. Possible seizure on Keppra.  Continue Keppra 500 twice daily.  EEG with diffuse slowing without any sharps.  Neurology recommended reimaging of the brain in a few days.  Patient following commands. 6. History of hypertension 7. History of PAD-outpatient vascular surgery follow-up Plan of care discussed with the patient and intensivist Code Status:     Code Status Orders  (From admission, onward)         Start     Ordered   08/05/19 0653  Do not attempt resuscitation (DNR)  Continuous    Question Answer Comment  In the event of cardiac or respiratory ARREST Do not call a "code blue"   In the event of cardiac or respiratory ARREST Do not perform Intubation, CPR, defibrillation or ACLS   In the event of cardiac or respiratory ARREST Use medication by any route, position, wound care, and other measures to relive pain and suffering. May use oxygen, suction and manual treatment of airway obstruction as needed for comfort.      08/05/19 [id number]        Code Status History    Date Active Date Inactive Code Status Order ID Comments User Context   08/05/2019 0613 08/05/2019 0652 Full Code [id number]  [name], NP Inpatient   08/04/2019 0127 08/05/2019 0613 Full Code [id number]  Mansy, Arvella Merles, MD ED   07/27/2019 1110 07/29/2019 1719 Full Code [id number]  Hillary Bow, MD ED   07/20/2019 2041 07/23/2019 1929 DNR [id number]  Vaughan Basta, MD Inpatient   07/09/2019 0543 07/12/2019 1502 Full Code [id number]   Harrie Foreman, MD Inpatient   07/02/2019 2128 07/06/2019 1557 Full Code [id number]  Henreitta Leber, MD Inpatient   05/19/2019 0603 05/22/2019 1957 Full Code [id number]  Lance Coon, MD Inpatient   02/28/2019 1805 03/03/2019 2234 Full Code [id number]  Vaughan Basta, MD Inpatient   02/21/2019 0835 02/24/2019 1945 Full Code [id number]  Harrie Foreman, MD Inpatient   02/04/2019 1343 02/09/2019 2050 Full Code [id number]  Saundra Shelling, MD ED   01/27/2019 0654 01/29/2019 2049 Partial Code [id number]  Harrie Foreman, MD Inpatient   01/07/2019 2156 01/27/2019 0638 Partial Code [id number]  Tennis Ship, MD Inpatient   12/25/2018 0158 01/07/2019 2027 Partial Code [id number]  Ina Homes, MD ED   Advance Care Planning Activity     Family Communication: As per critical care specialist Disposition Plan: To be determined  Consultants:  Critical care specialist  Cardiology  Nephrology  Antibiotics:  Cefepime  Time spent: 32 minutes.  Case discussed with respiratory, critical care specialist and nursing staff.  Nicholes Mango  [employer]

## 2019-08-09 NOTE — Progress Notes (Signed)
ANTICOAGULATION CONSULT NOTE  Pharmacy Consult for Heparin Indication: ACS/STEMI  Patient Measurements: Height: 5' 0.98" (154.9 cm) Weight: 111 lb 1.8 oz (50.4 kg) IBW/kg (Calculated) : 52.26 HEPARIN DW (KG): 43.4  Vital Signs: Temp: 98.3 F (36.8 C) (10/18 0400) Temp Source: Oral (10/18 0400) BP: 139/97 (10/18 0500) Pulse Rate: 94 (10/18 0500)  Labs: Recent Labs    08/06/19 1016  08/07/19 0312 08/08/19 0347 08/08/19 2221 08/08/19 2355 08/09/19 0438  HGB  --    < > 7.7* 7.8*  --   --  8.1*  HCT  --    < > 23.5* 25.3*  --   --  25.3*  PLT  --    < > 122* 131*  --   --  140*  APTT  --   --  66* 72*  --   --  71*  HEPARINUNFRC 0.85*  --   --   --   --   --   --   CREATININE  --    < > 2.57* 4.01*  --   --  2.65*  TROPONINIHS  --   --   --   --  101* 127*  --    < > = values in this interval not displayed.   Estimated Creatinine Clearance: 25.1 mL/min (A) (by C-G formula based on SCr of 2.65 mg/dL (H)).  Medical History: Past Medical History:  Diagnosis Date  . ESRD (end stage renal disease) ([place])   . Hypertension   . PAD (peripheral artery disease) ([facility])    Required aortofemoral stent-which had closed and had to redo the procedure and ischemia of limb.  . Peripheral vascular disease ([place])   . Renal disorder   . Secondary hyperparathyroidism of renal origin [facility])    Medications:  Medications Prior to Admission  Medication Sig Dispense Refill Last Dose  . amLODipine (NORVASC) 10 MG tablet Take 1 tablet (10 mg total) by mouth daily at 8 pm. 30 tablet 2 unknown at unknown  . apixaban (ELIQUIS) 2.5 MG TABS tablet Take 1 tablet (2.5 mg total) by mouth 2 (two) times daily. 60 tablet 1 unknown at unknown  . aspirin EC 81 MG tablet Take 1 tablet (81 mg total) by mouth daily. 30 tablet 1 unknown at unknown  . atorvastatin (LIPITOR) 80 MG tablet Take 1 tablet (80 mg total) by mouth at bedtime. 30 tablet 1 unknown at unknown  . b complex-C-folic acid 1 MG capsule Take 1  capsule by mouth daily after supper.    unknown at unknown  . carvedilol (COREG) 25 MG tablet Take 1 tablet (25 mg total) by mouth 2 (two) times daily with a meal. 60 tablet 1 unknown at unknown  . gabapentin (NEURONTIN) 100 MG capsule Take 1 capsule (100 mg total) by mouth 3 (three) times daily. 90 capsule 1 unknown at unknown  . hydrALAZINE (APRESOLINE) 25 MG tablet Take 1 tablet (25 mg total) by mouth every 8 (eight) hours. 90 tablet 1 unknown at unknown  . hydrOXYzine (ATARAX/VISTARIL) 25 MG tablet Take 25 mg by mouth 3 (three) times daily as needed for anxiety.   unknown at unknown  . loratadine (CLARITIN) 10 MG tablet Take 1 tablet (10 mg total) by mouth daily. 30 tablet 0 unknown at unknown  . multivitamin (RENA-VIT) TABS tablet Take 1 tablet by mouth daily. 30 each 1 unknown at unknown  . neomycin-bacitracin-polymyxin (NEOSPORIN) OINT Apply 1 application topically 2 (two) times daily. 56 g 1 unknown at unknown  .  Nutritional Supplements (FEEDING SUPPLEMENT, NEPRO CARB STEADY,) LIQD Take 237 mLs by mouth 2 (two) times daily between meals. 237 mL 30 unknown at unknown  . sevelamer carbonate (RENVELA) 800 MG tablet Take 2 tablets (1,600 mg total) by mouth 3 (three) times daily with meals. 180 tablet 1 unknown at unknown    Assessment: Pharmacy asked to initiate Heparin protocol for a suspected PE vs CVA s/p MI this am.  Pt has been on Apixaban w/ last dose being given on 10/13 at 2117.  H&H, PLT trending down: continue to monitor  Heparin Course: 10/14 am initiation: 1000 unit bolus, then 700 units/hr 10/14 1318: inc to 750 units/hr 10/14 2325 aPTT = 89, therapeutic 10/15 0422 aPTT = 77, therapeutic x 2.  CBC stable.  10/16 0312 aPTT = 66, therapeutic x 3.  CBC stable.  10/17 0347 aPTT = 72, therapeutic x 4.  CBC stable.  10/18 0438 aPTT = 71, therapeutic x 5.  CBC stable.   Goal of Therapy:  aPTT 66-102 seconds Monitor platelets by anticoagulation protocol: Yes   Plan:   aPTT is  within goal range, continue Heparin infusion at 800 units/hr   Check aPTT in am  CBC in am  Ena Dawley, PharmD 08/09/2019,5:38 AM

## 2019-08-09 NOTE — Progress Notes (Signed)
[facility]  ROUNDING NOTE   Subjective:  Patient more awake and alert today. Recognizes that he is at [facility] today. Completed dialysis yesterday.   Objective:  Vital signs in last 24 hours:  Temp:  [98.3 F (36.8 C)-98.7 F (37.1 C)] 98.6 F (37 C) (10/18 0900) Pulse Rate:  [86-114] 102 (10/18 0914) Resp:  [10-25] 10 (10/18 0914) BP: (132-172)/(83-117) 142/98 (10/18 0914) SpO2:  [91 %-100 %] 100 % (10/18 0914) Weight:  [50.4 kg] 50.4 kg (10/18 0500)  Weight change: -0.6 kg Filed Weights   08/07/19 0219 08/08/19 0500 08/09/19 0500  Weight: 49.7 kg 51 kg 50.4 kg    Intake/Output: I/O last 3 completed shifts: In: 969.9 [P.O.:240; I.V.:284.9; IV Piggyback:445] Out: 1000 [Other:1000]   Intake/Output this shift:  Total I/O In: 243 [P.O.:240; I.V.:3] Out: -   Physical Exam: General: No acute distress  Head: [place]/AT hearing intact  Eyes: Anicteric  Neck: Supple, trachea midline  Lungs:  Clear to auscultation, normal effort  Heart: S1S2 no rubs  Abdomen:  Soft, nontender, bowel sounds present  Extremities: No peripheral edema.  Neurologic: Awake, alert, follows commands  Skin: No lesions  Access: LUE AVF, Right IJ PC    Basic Metabolic Panel: Recent Labs  Lab 08/04/19 0845  08/06/19 0422 08/06/19 1124 08/06/19 1958 08/07/19 0312 08/08/19 0347 08/09/19 0438  NA 139   < > 138  --  135 137 140 139  K 3.4*   < > 4.8  --  3.6 3.8 4.7 3.4*  CL 99   < > 103  --  99 102 104 100  CO2 26   < > 19*  --  26 27 25 27   GLUCOSE 82   < > 112*  --  138* 129* 132* 127*  BUN 18   < > 36*  --  17 22* 33* 20  CREATININE 2.86*   < > 3.65*  --  2.16* 2.57* 4.01* 2.65*  CALCIUM 8.3*   < > 8.7*  --  8.2* 8.4* 8.9 8.6*  MG  --   --   --   --  1.6* 1.7 2.8* 1.9  PHOS 3.9  --   --  9.1*  --   --   --  3.8   < > = values in this interval not displayed.    Liver Function Tests: Recent Labs  Lab 08/03/19 0925 08/04/19 0845 08/05/19 0431  08/06/19 0422  AST 28  --  117* 79*  ALT 11  --  67* 66*  ALKPHOS 73  --  94 75  BILITOT 1.5*  --  1.2 1.7*  PROT 6.4*  --  6.2* 5.8*  ALBUMIN 2.9* 2.7* 2.6* 2.4*   No results for input(s): LIPASE, AMYLASE in the last 168 hours. No results for input(s): AMMONIA in the last 168 hours.  CBC: Recent Labs  Lab 08/04/19 0349 08/05/19 0431 08/05/19 1132 08/05/19 1819 08/06/19 0422 08/06/19 1958 08/07/19 0312 08/08/19 0347 08/09/19 0438  WBC 10.1 12.3* 12.1* 8.0 6.9 8.2 7.4 11.2* 11.7*  NEUTROABS 8.0* 10.7* 9.9* 7.1  --  7.0  --   --   --   HGB 9.2* 8.2* 8.2* 8.3* 8.0* 7.4* 7.7* 7.8* 8.1*  HCT 28.8* 26.8* 26.3* 26.3* 25.0* 22.8* 23.5* 25.3* 25.3*  MCV 100.0 104.3* 102.7* 102.3* 99.6 97.9 97.1 102.8* 101.2*  PLT 150 135* 134* 122* 116* 124* 122* 131* 140*    Cardiac Enzymes: No results for input(s): CKTOTAL, CKMB,  CKMBINDEX, TROPONINI in the last 168 hours.  BNP: Invalid input(s): POCBNP  CBG: Recent Labs  Lab 08/08/19 1936 08/09/19 0000 08/09/19 0336 08/09/19 0729 08/09/19 1125  GLUCAP 142* 103* 122* 137* 130*    Microbiology: Results for orders placed or performed during the hospital encounter of 08/03/19  SARS Coronavirus 2 by RT PCR (hospital order, performed in [facility] hospital lab) Nasopharyngeal Nasopharyngeal Swab     Status: None   Collection Time: 08/03/19  9:25 AM   Specimen: Nasopharyngeal Swab  Result Value Ref Range Status   SARS Coronavirus 2 NEGATIVE NEGATIVE Final    Comment: (NOTE) If result is NEGATIVE SARS-CoV-2 target nucleic acids are NOT DETECTED. The SARS-CoV-2 RNA is generally detectable in upper and lower  respiratory specimens during the acute phase of infection. The lowest  concentration of SARS-CoV-2 viral copies this assay can detect is 250  copies / mL. A negative result does not preclude SARS-CoV-2 infection  and should not be used as the sole basis for treatment or other  patient management decisions.  A negative result may  occur with  improper specimen collection / handling, submission of specimen other  than nasopharyngeal swab, presence of viral mutation(s) within the  areas targeted by this assay, and inadequate number of viral copies  (<250 copies / mL). A negative result must be combined with clinical  observations, patient history, and epidemiological information. If result is POSITIVE SARS-CoV-2 target nucleic acids are DETECTED. The SARS-CoV-2 RNA is generally detectable in upper and lower  respiratory specimens dur ing the acute phase of infection.  Positive  results are indicative of active infection with SARS-CoV-2.  Clinical  correlation with patient history and other diagnostic information is  necessary to determine patient infection status.  Positive results do  not rule out bacterial infection or co-infection with other viruses. If result is PRESUMPTIVE POSTIVE SARS-CoV-2 nucleic acids MAY BE PRESENT.   A presumptive positive result was obtained on the submitted specimen  and confirmed on repeat testing.  While 2019 novel coronavirus  (SARS-CoV-2) nucleic acids may be present in the submitted sample  additional confirmatory testing may be necessary for epidemiological  and / or clinical management purposes  to differentiate between  SARS-CoV-2 and other Sarbecovirus currently known to infect humans.  If clinically indicated additional testing with an alternate test  methodology [phone]) is advised. The SARS-CoV-2 RNA is generally  detectable in upper and lower respiratory sp ecimens during the acute  phase of infection. The expected result is Negative. Fact Sheet for Patients:  StrictlyIdeas.no Fact Sheet for Healthcare Providers: BankingDealers.co.za This test is not yet approved or cleared by the [place] FDA and has been authorized for detection and/or diagnosis of SARS-CoV-2 by FDA under an Emergency Use Authorization (EUA).  This  EUA will remain in effect (meaning this test can be used) for the duration of the COVID-19 declaration under Section 564(b)(1) of the Act, 21 U.S.C. section 360bbb-3(b)(1), unless the authorization is terminated or revoked sooner. Performed at [facility], [address] [place], [place] [address]   Blood culture (routine x 2)     Status: None   Collection Time: 08/03/19 10:05 AM   Specimen: BLOOD  Result Value Ref Range Status   Specimen Description BLOOD BLOOD RIGHT FOREARM  Final   Special Requests   Final    BOTTLES DRAWN AEROBIC AND ANAEROBIC Blood Culture adequate volume   Culture   Final    NO GROWTH 5 DAYS Performed at  [facility], [address] [place], [place] [address]    Report Status 08/08/2019 FINAL  Final  MRSA PCR Screening     Status: None   Collection Time: 08/04/19  7:56 AM   Specimen: Nasopharyngeal  Result Value Ref Range Status   MRSA by PCR NEGATIVE NEGATIVE Final    Comment:        The GeneXpert MRSA Assay (FDA approved for NASAL specimens only), is one component of a comprehensive MRSA colonization surveillance program. It is not intended to diagnose MRSA infection nor to guide or monitor treatment for MRSA infections. Performed at [facility], [address] [place], [place] [address]   Urine Culture     Status: None   Collection Time: 08/05/19 10:14 AM   Specimen: Urine, Random  Result Value Ref Range Status   Specimen Description   Final    URINE, RANDOM Performed at [facility], [address] [place], [place] [address]    Special Requests   Final    NONE Performed at [facility], [address] [place], [place] [address]    Culture   Final    NO GROWTH Performed at [facility], [place] [address] [place], [place] [address]    Report Status 08/06/2019 FINAL  Final  Culture, respiratory (non-expectorated)     Status: None   Collection Time: 08/05/19  8:40 PM   Specimen:  Tracheal Aspirate; Respiratory  Result Value Ref Range Status   Specimen Description   Final    TRACHEAL ASPIRATE Performed at [facility], [address] [place], [place] [address]    Special Requests   Final    Normal Performed at [facility], [address] [place], [place] [address]    Gram Stain   Final    FEW WBC PRESENT,BOTH PMN AND MONONUCLEAR NO ORGANISMS SEEN    Culture   Final    RARE Consistent with normal respiratory flora. Performed at [facility], [place] [address], [place], [place] [address]    Report Status 08/08/2019 FINAL  Final    Coagulation Studies: No results for input(s): LABPROT, INR in the last 72 hours.  Urinalysis: No results for input(s): COLORURINE, LABSPEC, PHURINE, GLUCOSEU, HGBUR, BILIRUBINUR, KETONESUR, PROTEINUR, UROBILINOGEN, NITRITE, LEUKOCYTESUR in the last 72 hours.  Invalid input(s): APPERANCEUR    Imaging: No results found.   Medications:   . sodium chloride    . sodium chloride    . sodium chloride    . ceFEPime (MAXIPIME) IV Stopped (08/08/19 1239)  . famotidine (PEPCID) IV Stopped (08/08/19 1850)  . heparin 800 Units/hr (08/09/19 0700)  . levETIRAcetam 500 mg (08/09/19 0953)   . aspirin  81 mg Per Tube Daily  . Chlorhexidine Gluconate Cloth  6 each Topical Q0600  . epoetin (EPOGEN/PROCRIT) injection  10,000 Units Intravenous Q T,Th,Sa-HD  . hydrocortisone sod succinate (SOLU-CORTEF) inj  100 mg Intravenous Q12H  . insulin aspart  0-9 Units Subcutaneous Q4H  . pantoprazole (PROTONIX) IV  40 mg Intravenous QHS   sodium chloride, sodium chloride, acetaminophen, albuterol, alteplase, heparin, labetalol, lidocaine (PF), lidocaine-prilocaine, nitroGLYCERIN, ondansetron (ZOFRAN) IV, pentafluoroprop-tetrafluoroeth, promethazine  Assessment/ Plan:  43 y.o. male with end-stage renal disease on hemodialysis, hypertension, peripheral vascular disease, depression.  [place] TTS    1.   ESRD on HD TTS.  Patient completed dialysis treatment yesterday.  Tolerated well.  No acute indication for dialysis today.  2.  Acute respiratory  failure/PEA arrest.  Patient much more awake and alert today.  Follows commands readily.  Recognizes that he is at [facility].  3.  Anemia of chronic kidney disease.  Continue to periodically monitor CBC and maintain the patient on Epogen 10,000 units IV with dialysis.  4.  Secondary hyperparathyroidism.  Phosphorus 3.8 and at target.   LOS: 5 Abbigayle Toole 10/18/20202:10 PM

## 2019-08-10 ENCOUNTER — Inpatient Hospital Stay: Payer: Medicare Other

## 2019-08-10 LAB — GLUCOSE, CAPILLARY
Glucose-Capillary: 109 mg/dL — ABNORMAL HIGH (ref 70–99)
Glucose-Capillary: 114 mg/dL — ABNORMAL HIGH (ref 70–99)
Glucose-Capillary: 114 mg/dL — ABNORMAL HIGH (ref 70–99)
Glucose-Capillary: 129 mg/dL — ABNORMAL HIGH (ref 70–99)
Glucose-Capillary: 88 mg/dL (ref 70–99)
Glucose-Capillary: 93 mg/dL (ref 70–99)

## 2019-08-10 LAB — BASIC METABOLIC PANEL
Anion gap: 14 (ref 5–15)
BUN: 35 mg/dL — ABNORMAL HIGH (ref 6–20)
CO2: 24 mmol/L (ref 22–32)
Calcium: 8.5 mg/dL — ABNORMAL LOW (ref 8.9–10.3)
Chloride: 99 mmol/L (ref 98–111)
Creatinine, Ser: 3.85 mg/dL — ABNORMAL HIGH (ref 0.61–1.24)
GFR calc Af Amer: 21 mL/min — ABNORMAL LOW (ref >60)
GFR calc non Af Amer: 18 mL/min — ABNORMAL LOW (ref >60)
Glucose, Bld: 138 mg/dL — ABNORMAL HIGH (ref 70–99)
Potassium: 3.7 mmol/L (ref 3.5–5.1)
Sodium: 137 mmol/L (ref 135–145)

## 2019-08-10 LAB — CBC
HCT: 26.8 % — ABNORMAL LOW (ref 39.0–52.0)
Hemoglobin: 8.5 g/dL — ABNORMAL LOW (ref 13.0–17.0)
MCH: 31.8 pg (ref 26.0–34.0)
MCHC: 31.7 g/dL (ref 30.0–36.0)
MCV: 100.4 fL — ABNORMAL HIGH (ref 80.0–100.0)
Platelets: 161 10*3/uL (ref 150–400)
RBC: 2.67 MIL/uL — ABNORMAL LOW (ref 4.22–5.81)
RDW: 17.7 % — ABNORMAL HIGH (ref 11.5–15.5)
WBC: 9.5 10*3/uL (ref 4.0–10.5)
nRBC: 0.4 % — ABNORMAL HIGH (ref 0.0–0.2)

## 2019-08-10 LAB — APTT: aPTT: 92 seconds — ABNORMAL HIGH (ref 24–36)

## 2019-08-10 MED ORDER — TECHNETIUM TO 99M ALBUMIN AGGREGATED
4.0000 | Freq: Once | INTRAVENOUS | Status: AC | PRN
  Administered 2019-08-10: 4.341 via INTRAVENOUS

## 2019-08-10 MED ORDER — PANTOPRAZOLE SODIUM 20 MG PO TBEC
20.0000 mg | DELAYED_RELEASE_TABLET | Freq: Every day | ORAL | Status: DC
  Administered 2019-08-11 – 2019-08-17 (×4): 20 mg via ORAL
  Filled 2019-08-10 (×7): qty 1

## 2019-08-10 MED ORDER — TECHNETIUM TC 99M DIETHYLENETRIAME-PENTAACETIC ACID
30.0000 | Freq: Once | INTRAVENOUS | Status: AC | PRN
  Administered 2019-08-10: 12:00:00 29.9 via INTRAVENOUS

## 2019-08-10 MED ORDER — RENA-VITE PO TABS
1.0000 | ORAL_TABLET | Freq: Every day | ORAL | Status: DC
  Administered 2019-08-10 – 2019-08-16 (×7): 1 via ORAL
  Filled 2019-08-10 (×8): qty 1

## 2019-08-10 MED ORDER — NEPRO/CARBSTEADY PO LIQD: 237.0000 mL | Freq: Two times a day (BID) | ORAL | Status: DC

## 2019-08-10 NOTE — Progress Notes (Signed)
[facility]  ROUNDING NOTE   Subjective:   Alert this morning. Complains of shortness of breath.   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    Objective:  Vital signs in last 24 hours:  Temp:  [97.6 F (36.4 C)-98 F (36.7 C)] 97.6 F (36.4 C) (10/19 0713) Pulse Rate:  [83-100] 91 (10/19 0713) Resp:  [12-20] 18 (10/19 0713) BP: (125-152)/(88-115) 140/107 (10/19 0713) SpO2:  [94 %-100 %] 100 % (10/19 0713) Weight:  [45.7 kg-46.2 kg] 45.7 kg (10/19 0102)  Weight change: -4.224 kg Filed Weights   08/09/19 0500 08/09/19 1959 08/10/19 0102  Weight: 50.4 kg 46.2 kg 45.7 kg    Intake/Output: I/O last 3 completed shifts: In: 1193.3 [P.O.:600; I.V.:248.3; IV Piggyback:345] Out: 0    Intake/Output this shift:  Total I/O In: 360 [P.O.:360] Out: -   Physical Exam: General: No acute distress  Head: [place]/AT    Eyes: Anicteric  Neck: Supple, trachea midline  Lungs:  Clear to auscultation, normal effort  Heart: regular  Abdomen:  Soft, nontender, bowel sounds present  Extremities: No peripheral edema.  Neurologic: Awake, alert, follows commands  Skin: No lesions  Access: LUE AVF - anuerysm, Right IJ PC,        Basic Metabolic Panel: Recent Labs  Lab 08/04/19 0845  08/06/19 1124 08/06/19 1958 08/07/19 0312 08/08/19 0347 08/09/19 0438 08/10/19 0454  NA 139   < >  --  135 137 140 139 137  K 3.4*   < >  --  3.6 3.8 4.7 3.4* 3.7  CL 99   < >  --  99 102 104 100 99  CO2 26   < >  --  26 27 25 27 24   GLUCOSE 82   < >  --  138* 129* 132* 127* 138*  BUN 18   < >  --  17 22* 33* 20 35*  CREATININE 2.86*   < >  --  2.16* 2.57* 4.01* 2.65* 3.85*  CALCIUM 8.3*   < >  --  8.2* 8.4* 8.9 8.6* 8.5*  MG  --   --   --  1.6* 1.7 2.8* 1.9  --   PHOS 3.9  --  9.1*  --   --   --  3.8  --    < > = values in this interval not displayed.    Liver Function Tests: Recent Labs  Lab 08/04/19 0845 08/05/19 0431 08/06/19 0422  AST  --  117* 79*  ALT  --  67* 66*  ALKPHOS   --  94 75  BILITOT  --  1.2 1.7*  PROT  --  6.2* 5.8*  ALBUMIN 2.7* 2.6* 2.4*   No results for input(s): LIPASE, AMYLASE in the last 168 hours. No results for input(s): AMMONIA in the last 168 hours.  CBC: Recent Labs  Lab 08/04/19 0349 08/05/19 0431 08/05/19 1132 08/05/19 1819  08/06/19 1958 08/07/19 0312 08/08/19 0347 08/09/19 0438 08/10/19 0454  WBC 10.1 12.3* 12.1* 8.0   < > 8.2 7.4 11.2* 11.7* 9.5  NEUTROABS 8.0* 10.7* 9.9* 7.1  --  7.0  --   --   --   --   HGB 9.2* 8.2* 8.2* 8.3*   < > 7.4* 7.7* 7.8* 8.1* 8.5*  HCT 28.8* 26.8* 26.3* 26.3*   < > 22.8* 23.5* 25.3* 25.3* 26.8*  MCV 100.0 104.3* 102.7* 102.3*   < > 97.9 97.1 102.8* 101.2* 100.4*  PLT 150 135* 134* 122*   < >  124* 122* 131* 140* 161   < > = values in this interval not displayed.    Cardiac Enzymes: No results for input(s): CKTOTAL, CKMB, CKMBINDEX, TROPONINI in the last 168 hours.  BNP: Invalid input(s): POCBNP  CBG: Recent Labs  Lab 08/09/19 1525 08/09/19 1956 08/10/19 0020 08/10/19 0449 08/10/19 0716  GLUCAP 108* 123* 114* 129* 114*    Microbiology: Results for orders placed or performed during the hospital encounter of 08/03/19  SARS Coronavirus 2 by RT PCR (hospital order, performed in [facility] hospital lab) Nasopharyngeal Nasopharyngeal Swab     Status: None   Collection Time: 08/03/19  9:25 AM   Specimen: Nasopharyngeal Swab  Result Value Ref Range Status   SARS Coronavirus 2 NEGATIVE NEGATIVE Final    Comment: (NOTE) If result is NEGATIVE SARS-CoV-2 target nucleic acids are NOT DETECTED. The SARS-CoV-2 RNA is generally detectable in upper and lower  respiratory specimens during the acute phase of infection. The lowest  concentration of SARS-CoV-2 viral copies this assay can detect is 250  copies / mL. A negative result does not preclude SARS-CoV-2 infection  and should not be used as the sole basis for treatment or other  patient management decisions.  A negative result may  occur with  improper specimen collection / handling, submission of specimen other  than nasopharyngeal swab, presence of viral mutation(s) within the  areas targeted by this assay, and inadequate number of viral copies  (<250 copies / mL). A negative result must be combined with clinical  observations, patient history, and epidemiological information. If result is POSITIVE SARS-CoV-2 target nucleic acids are DETECTED. The SARS-CoV-2 RNA is generally detectable in upper and lower  respiratory specimens dur ing the acute phase of infection.  Positive  results are indicative of active infection with SARS-CoV-2.  Clinical  correlation with patient history and other diagnostic information is  necessary to determine patient infection status.  Positive results do  not rule out bacterial infection or co-infection with other viruses. If result is PRESUMPTIVE POSTIVE SARS-CoV-2 nucleic acids MAY BE PRESENT.   A presumptive positive result was obtained on the submitted specimen  and confirmed on repeat testing.  While 2019 novel coronavirus  (SARS-CoV-2) nucleic acids may be present in the submitted sample  additional confirmatory testing may be necessary for epidemiological  and / or clinical management purposes  to differentiate between  SARS-CoV-2 and other Sarbecovirus currently known to infect humans.  If clinically indicated additional testing with an alternate test  methodology [phone]) is advised. The SARS-CoV-2 RNA is generally  detectable in upper and lower respiratory sp ecimens during the acute  phase of infection. The expected result is Negative. Fact Sheet for Patients:  StrictlyIdeas.no Fact Sheet for Healthcare Providers: BankingDealers.co.za This test is not yet approved or cleared by the [place] FDA and has been authorized for detection and/or diagnosis of SARS-CoV-2 by FDA under an Emergency Use Authorization (EUA).  This  EUA will remain in effect (meaning this test can be used) for the duration of the COVID-19 declaration under Section 564(b)(1) of the Act, 21 U.S.C. section 360bbb-3(b)(1), unless the authorization is terminated or revoked sooner. Performed at [facility], [name] [place], [place] [address]   Blood culture (routine x 2)     Status: None   Collection Time: 08/03/19 10:05 AM   Specimen: BLOOD  Result Value Ref Range Status   Specimen Description BLOOD BLOOD RIGHT FOREARM  Final   Special Requests   Final  BOTTLES DRAWN AEROBIC AND ANAEROBIC Blood Culture adequate volume   Culture   Final    NO GROWTH 5 DAYS Performed at [facility], [address] [place], [place] [address]    Report Status 08/08/2019 FINAL  Final  MRSA PCR Screening     Status: None   Collection Time: 08/04/19  7:56 AM   Specimen: Nasopharyngeal  Result Value Ref Range Status   MRSA by PCR NEGATIVE NEGATIVE Final    Comment:        The GeneXpert MRSA Assay (FDA approved for NASAL specimens only), is one component of a comprehensive MRSA colonization surveillance program. It is not intended to diagnose MRSA infection nor to guide or monitor treatment for MRSA infections. Performed at [facility], [address], [place], [place] [address]   Urine Culture     Status: None   Collection Time: 08/05/19 10:14 AM   Specimen: Urine, Random  Result Value Ref Range Status   Specimen Description   Final    URINE, RANDOM Performed at [facility], [address] [place], [place] [address]    Special Requests   Final    NONE Performed at [facility], [address], [place], [place] [address]    Culture   Final    NO GROWTH Performed at [facility], [place] [address] [place], [place] [address]    Report Status 08/06/2019 FINAL  Final  Culture, respiratory (non-expectorated)     Status: None   Collection Time: 08/05/19  8:40 PM   Specimen:  Tracheal Aspirate; Respiratory  Result Value Ref Range Status   Specimen Description   Final    TRACHEAL ASPIRATE Performed at [facility], [address] [place], [place] [address]    Special Requests   Final    Normal Performed at [facility], [address] [place], [place] [address]    Gram Stain   Final    FEW WBC PRESENT,BOTH PMN AND MONONUCLEAR NO ORGANISMS SEEN    Culture   Final    RARE Consistent with normal respiratory flora. Performed at [facility], [place] [address], [place], [place] [address]    Report Status 08/08/2019 FINAL  Final    Coagulation Studies: No results for input(s): LABPROT, INR in the last 72 hours.  Urinalysis: No results for input(s): COLORURINE, LABSPEC, PHURINE, GLUCOSEU, HGBUR, BILIRUBINUR, KETONESUR, PROTEINUR, UROBILINOGEN, NITRITE, LEUKOCYTESUR in the last 72 hours.  Invalid input(s): APPERANCEUR    Imaging: Dg Chest Port 1 View  Result Date: 08/10/2019 CLINICAL DATA:  Pt has a cough. Hx of hypertension, ESRD, perm cath. Current everyday smoker EXAM: PORTABLE CHEST 1 VIEW COMPARISON:  Radiograph 08/06/2011 FINDINGS: Large-bore central venous line unchanged. Interval extubation. Removal of NG tube. Stable enlarged cardiac silhouette. There is a moderate LEFT effusion similar prior. Central venous congestion slightly increased. Vascular stent noted in the LEFT axilla IMPRESSION: 1. Extubation without complication. 2. Increase in central venous congestion. 3. Stable LEFT pleural effusion. Electronically Signed   By: Suzy Bouchard M.D.   On: 08/10/2019 11:57     Medications:   . sodium chloride    . sodium chloride    . sodium chloride    . famotidine (PEPCID) IV Stopped (08/09/19 1913)  . heparin 800 Units/hr (08/09/19 2341)  . levETIRAcetam 500 mg (08/10/19 0921)   . aspirin  81 mg Per Tube Daily  . Chlorhexidine Gluconate Cloth  6 each Topical Q0600  .  epoetin (EPOGEN/PROCRIT) injection  10,000 Units  Intravenous Q T,Th,Sa-HD  . hydrocortisone sod succinate (SOLU-CORTEF) inj  100 mg Intravenous Q12H  . insulin aspart  0-9 Units Subcutaneous Q4H  . pantoprazole (PROTONIX) IV  40 mg Intravenous QHS   sodium chloride, sodium chloride, acetaminophen, albuterol, alteplase, heparin, labetalol, lidocaine (PF), lidocaine-prilocaine, nitroGLYCERIN, ondansetron (ZOFRAN) IV, pentafluoroprop-tetrafluoroeth, promethazine  Assessment/ Plan:  43 y.o.white male with end-stage renal disease on hemodialysis, hypertension, peripheral vascular disease, depression.  [place] TTS  Left AVG  1.  ESRD on HD TTS.   - dialysis for tomorrow - with complication of dialysis device - anuerysmal AVG - consult vascular.   2.  Acute respiratory failure/PEA arrest.   - heparin gtt - V/Q scan for today.   3.  Anemia of chronic kidney disease.  Hemoglobin 8.5 - EPO with HD treatment  4.  Secondary hyperparathyroidism: not currently on binders.   5. Hypertension: elevated 140/107 - IV labetalol.    LOS: 6 Trey Bebee 10/19/202012:05 PM

## 2019-08-10 NOTE — Progress Notes (Signed)
ANTICOAGULATION CONSULT NOTE  Pharmacy Consult for Heparin Indication: ACS/STEMI  Patient Measurements: Height: 5\' 1"  (154.9 cm) Weight: 100 lb 12.8 oz (45.7 kg) IBW/kg (Calculated) : 52.3 HEPARIN DW (KG): 46.2  Vital Signs: Temp: 97.6 F (36.4 C) (10/18 1959) Temp Source: Oral (10/18 1959) BP: 152/115 (10/18 1959) Pulse Rate: 100 (10/18 1959)  Labs: Recent Labs    08/08/19 0347 08/08/19 2221 08/08/19 2355 08/09/19 0438 08/10/19 0454  HGB 7.8*  --   --  8.1* 8.5*  HCT 25.3*  --   --  25.3* 26.8*  PLT 131*  --   --  140* 161  APTT 72*  --   --  71* 92*  CREATININE 4.01*  --   --  2.65*  --   TROPONINIHS  --  101* 127*  --   --    Estimated Creatinine Clearance: 22.8 mL/min (A) (by C-G formula based on SCr of 2.65 mg/dL (H)).  Medical History: Past Medical History:  Diagnosis Date  . ESRD (end stage renal disease) ([place])   . Hypertension   . PAD (peripheral artery disease) (HCC)    Required aortofemoral stent-which had closed and had to redo the procedure and ischemia of limb.  . Peripheral vascular disease ([place])   . Renal disorder   . Secondary hyperparathyroidism of renal origin [facility])    Medications:  Medications Prior to Admission  Medication Sig Dispense Refill Last Dose  . amLODipine (NORVASC) 10 MG tablet Take 1 tablet (10 mg total) by mouth daily at 8 pm. 30 tablet 2 unknown at unknown  . apixaban (ELIQUIS) 2.5 MG TABS tablet Take 1 tablet (2.5 mg total) by mouth 2 (two) times daily. 60 tablet 1 unknown at unknown  . aspirin EC 81 MG tablet Take 1 tablet (81 mg total) by mouth daily. 30 tablet 1 unknown at unknown  . atorvastatin (LIPITOR) 80 MG tablet Take 1 tablet (80 mg total) by mouth at bedtime. 30 tablet 1 unknown at unknown  . b complex-C-folic acid 1 MG capsule Take 1 capsule by mouth daily after supper.    unknown at unknown  . carvedilol (COREG) 25 MG tablet Take 1 tablet (25 mg total) by mouth 2 (two) times daily with a meal. 60 tablet 1 unknown at  unknown  . gabapentin (NEURONTIN) 100 MG capsule Take 1 capsule (100 mg total) by mouth 3 (three) times daily. 90 capsule 1 unknown at unknown  . hydrALAZINE (APRESOLINE) 25 MG tablet Take 1 tablet (25 mg total) by mouth every 8 (eight) hours. 90 tablet 1 unknown at unknown  . hydrOXYzine (ATARAX/VISTARIL) 25 MG tablet Take 25 mg by mouth 3 (three) times daily as needed for anxiety.   unknown at unknown  . loratadine (CLARITIN) 10 MG tablet Take 1 tablet (10 mg total) by mouth daily. 30 tablet 0 unknown at unknown  . multivitamin (RENA-VIT) TABS tablet Take 1 tablet by mouth daily. 30 each 1 unknown at unknown  . neomycin-bacitracin-polymyxin (NEOSPORIN) OINT Apply 1 application topically 2 (two) times daily. 56 g 1 unknown at unknown  . Nutritional Supplements (FEEDING SUPPLEMENT, NEPRO CARB STEADY,) LIQD Take 237 mLs by mouth 2 (two) times daily between meals. 237 mL 30 unknown at unknown  . sevelamer carbonate (RENVELA) 800 MG tablet Take 2 tablets (1,600 mg total) by mouth 3 (three) times daily with meals. 180 tablet 1 unknown at unknown    Assessment: Pharmacy asked to initiate Heparin protocol for a suspected PE vs CVA s/p MI this  am.  Pt has been on Apixaban w/ last dose being given on 10/13 at 2117.  H&H, PLT trending down: continue to monitor  Heparin Course: 10/14 am initiation: 1000 unit bolus, then 700 units/hr 10/14 1318: inc to 750 units/hr 10/14 2325 aPTT = 89, therapeutic 10/15 0422 aPTT = 77, therapeutic x 2.  CBC stable.  10/16 0312 aPTT = 66, therapeutic x 3.  CBC stable.  10/17 0347 aPTT = 72, therapeutic x 4.  CBC stable.  10/18 0438 aPTT = 71, therapeutic x 5.  CBC stable.  10/19 0454 aPTT = 92, therapeutic x 6.  CBC stable.   Goal of Therapy:  aPTT 66-102 seconds Monitor platelets by anticoagulation protocol: Yes   Plan:   aPTT is within goal range, continue Heparin infusion at 800 units/hr   Check aPTT in am  CBC in am  Ena Dawley,  PharmD 08/10/2019,5:33 AM

## 2019-08-10 NOTE — Care Management Important Message (Signed)
Important Message  Patient Details  Name: Brett Small MRN: [id number] Date of Birth: 05-Sep-1976   Medicare Important Message Given:  Yes     Dannette Barbara 08/10/2019, 11:00 AM

## 2019-08-10 NOTE — TOC Initial Note (Addendum)
Transition of Care [facility]) - Initial/Assessment Note    Patient Details  Name: Brett Small MRN: [id number] Date of Birth: [birth year]  Transition of Care [facility]) CM/SW Contact:    Elza Rafter, RN Phone Number: 08/10/2019, 2:10 PM  Clinical Narrative:      Transferred from ICU over the weekend to 2A; extubated on 10-16.    Initially admitted with SOB.  This RNCM to room for consult-patient staring at phone; limited interaction when asked questions.  Giving one wrod answers.   HD patient TTS at [facility] in [place] @ 1100.  Does not have a PCP.  He sees nephrology.  Uses [employer] for transportation or friends sometimes transport him.   He lives in a boarding house; obtains medications at [facility] without difficulty.  He is currently on 2L oxygen acute.  S/P VQ scan today.  CM for heart failure screen and home health/PT.  Will place PT order.  Open to [facility] for RN,PT   [name] with [facility] [employer] is aware of admission.               Patient Goals and CMS Choice        Expected Discharge Plan and Services                                                Prior Living Arrangements/Services                       Activities of Daily Living Home Assistive Devices/Equipment: Dentures (specify type), Eyeglasses, Walker (specify type), Cane (specify quad or straight) ADL Screening (condition at time of admission) Patient's cognitive ability adequate to safely complete daily activities?: Yes Is the patient deaf or have difficulty hearing?: No Does the patient have difficulty seeing, even when wearing glasses/contacts?: No Does the patient have difficulty concentrating, remembering, or making decisions?: No Patient able to express need for assistance with ADLs?: No Does the patient have difficulty dressing or bathing?: No Independently performs ADLs?: Yes (appropriate for developmental age) Communication: Independent Dressing (OT): Independent Grooming:  Independent Feeding: Independent Bathing: Independent Toileting: Independent In/Out Bed: Independent Walks in Home: Independent Does the patient have difficulty walking or climbing stairs?: Yes Weakness of Legs: Both Weakness of Arms/Hands: Both  Permission Sought/Granted                  Emotional Assessment              Admission diagnosis:  RUQ pain [R10.11] Flash pulmonary edema (HCC) [J81.0] Acute respiratory failure with hypoxia ([place]) [J96.01] Other elevated white blood cell (WBC) count [D72.828] Acute CHF (congestive heart failure) ([place]) [I50.9] Patient Active Problem List   Diagnosis Date Noted  . Seizure ([place])   . Malnutrition of moderate degree 08/05/2019  . Acute CHF (congestive heart failure) ([place]) 08/04/2019  . Pulmonary edema 07/27/2019  . Pleural effusion 07/20/2019  . NSTEMI (non-ST elevated myocardial infarction) ([place]) 07/09/2019  . Pneumonia 07/02/2019  . Acute liver failure 05/19/2019  . Hematemesis 05/19/2019  . Hepatitis   . Thrombocytopenia ([place])   . Coagulopathy ([place])   . Sepsis ([place]) 02/28/2019  . Lobar pneumonia ([place]) 02/28/2019  . Acute respiratory failure ([place]) 02/04/2019  . Acute respiratory failure with hypoxemia ([place]) 01/27/2019  . Depression 01/07/2019  . MDD (major depressive disorder), single episode, severe , no psychosis (  [place])   . Homelessness   . Acute respiratory failure with hypoxia ([place]) 12/25/2018  . End stage renal disease on dialysis ([place]) 12/25/2018  . Hypertension 12/25/2018  . Renal osteodystrophy 12/25/2018   PCP:  Patient, [name] Pharmacy:   [place], [place] [address] [place] [address] Phone: [phone] Fax: [phone]  [place], [place] [address] [place] [address] Phone: [phone] Fax: [phone]  [place], [place] [address] [place] [address] Phone: [phone] Fax: [phone]     Social Determinants of Health ([place]) Interventions    Readmission Risk Interventions Readmission Risk Prevention Plan 08/10/2019 07/28/2019 07/28/2019  Transportation Screening Complete Complete Complete  Medication Review Press photographer) Complete Complete Complete  PCP or Specialist appointment within 3-5 days of discharge - Complete Complete  PCP/Specialist Appt Not Complete comments - - -  [place] or Home Care Consult Complete Complete Complete  SW Recovery Care/Counseling Consult Complete (No Data) -  Palliative Care Screening Patient Refused Not Applicable Not [place] - Not Applicable Not Applicable

## 2019-08-10 NOTE — Progress Notes (Signed)
Nutrition Follow-up  DOCUMENTATION CODES:   Non-severe (moderate) malnutrition in context of chronic illness  INTERVENTION:  Once patient has a diet order again recommend Nepro Shake po BID, each supplement provides 425 kcal and 19 grams protein.  Also provide Rena-vite QHS.  Consider addition of bowel regimen as patient has not had BM since 10/13 according to chart.  NUTRITION DIAGNOSIS:   Moderate Malnutrition related to chronic illness(ESRD on HD, CHF) as evidenced by moderate fat depletion, mild muscle depletion, moderate muscle depletion.  Ongoing.  GOAL:   Patient will meet greater than or equal to 90% of their needs  Progressing.  MONITOR:   PO intake, Supplement acceptance, Labs, Weight trends, I & O's  REASON FOR ASSESSMENT:   Ventilator    ASSESSMENT:   43 year old male with PMHx of HTN, ESRD on HD, secondary hyperparathyroidism, PVD, PAD, CHF with EF <20% who was admitted with volume overload and flash pulmonary edema, but on 10/14 suffered PEA arrest requiring 10 minutes of CPR/ACLS before ROSC was achieved, patient was intubated and placed on 36C TTM protocol.   -Patient was extubated on 10/16. -Patient was advanced to full liquid diet on 10/17.  Met with patient at bedside. He reports his appetite is "okay." Per chart his PO intake is variable (20-95%). He has been on full liquids and reports he is ready for solid food. For some reason his full liquid diet was canceled today but he was never ordered for NPO or another diet order. Discussed with RN and she is reaching out to MD to see if patient can be ordered for a diet. Patient is amenable to drinking oral nutrition supplement to meet calorie/protein needs.  Medications reviewed and include: Epogen 10000 units during HD, Solu-Cortef 100 mg Q12hrs IV, Novolog 0-9 units Q4hrs, pantoprazole, famotidine, heparin, Keppra.  Labs reviewed: CBG 114-129, BUN 35, Creatinine 3.85.  Diet Order:   Diet Order    None      EDUCATION NEEDS:   No education needs have been identified at this time  Skin:  Skin Assessment: Reviewed RN Assessment  Last BM:  08/04/2019 per chart  Height:   Ht Readings from Last 1 Encounters:  08/09/19 '5\' 1"'  (1.549 m)   Weight:   Wt Readings from Last 1 Encounters:  08/10/19 45.7 kg   Ideal Body Weight:  50.9 kg  BMI:  Body mass index is 19.05 kg/m.  Estimated Nutritional Needs:   Kcal:  1550-1750 (MSJ x 1.3-1.5)  Protein:  78-88 grams (1.8-2 grams/kg)  Fluid:  UOP + 1 L  Willey Blade, MS, RD, LDN Office: [phone] Pager: [phone] After Hours/Weekend Pager: [phone]

## 2019-08-10 NOTE — Progress Notes (Signed)
Patient ID: Brett Small, male   DOB: [birth year], 43 y.o.   MRN: [id number]  [facility] PROGRESS NOTE  Brett Small [id number] DOB: 1976-02-09 DOA: 08/03/2019 PCP: Patient, [name]  HPI/Subjective: Patient was extubated 08/07/2019.  VQ scan ordered to rule out pulmonary embolism today  No overnight incidents  Objective: Vitals:   08/10/19 0539 08/10/19 0713  BP: (!) 133/109 (!) 140/107  Pulse: 83 91  Resp: 17 18  Temp: 98 F (36.7 C) 97.6 F (36.4 C)  SpO2: 94% 100%    Intake/Output Summary (Last 24 hours) at 08/10/2019 1404 Last data filed at 08/10/2019 0900 Gross per 24 hour  Intake 815.67 ml  Output 0 ml  Net 815.67 ml   Filed Weights   08/09/19 0500 08/09/19 1959 08/10/19 0102  Weight: 50.4 kg 46.2 kg 45.7 kg    ROS: Review of Systems  Unable to perform ROS: Acuity of condition  Constitutional: Negative for chills, diaphoresis and fever.  HENT: Negative for ear discharge, hearing loss and nosebleeds.   Eyes: Negative for blurred vision and pain.  Respiratory: Negative for cough, hemoptysis and shortness of breath.   Cardiovascular: Negative for palpitations and orthopnea.  Gastrointestinal: Negative for abdominal pain, diarrhea, nausea and vomiting.  Skin: Negative for itching and rash.  Neurological: Negative for dizziness, tingling, speech change and weakness.  Psychiatric/Behavioral: Negative for depression. The patient is not nervous/anxious.    Exam: Physical Exam  HENT:  Mouth/Throat: No oropharyngeal exudate.  Unable to look on the mouth  Eyes: Pupils are equal, round, and reactive to light. Conjunctivae and lids are normal.  Neck: Trachea normal. Carotid bruit is not present.  Cardiovascular: Regular rhythm, S1 normal, S2 normal and normal heart sounds.  Respiratory: He has decreased breath sounds in the right lower field and the left lower field. He has no wheezes. He has rhonchi in the right lower field and the left lower field. He  has no rales.  GI: Soft. Bowel sounds are normal. There is no abdominal tenderness.  Musculoskeletal:     Right ankle: He exhibits no swelling.     Left ankle: He exhibits no swelling.  Neurological: He is alert.  Follows commands and able to move all extremities.  Skin: Skin is warm. No rash noted. Nails show no clubbing.  Psychiatric: He has a normal mood and affect.      Data Reviewed: Basic Metabolic Panel: Recent Labs  Lab 08/04/19 0845  08/06/19 1124 08/06/19 1958 08/07/19 0312 08/08/19 0347 08/09/19 0438 08/10/19 0454  NA 139   < >  --  135 137 140 139 137  K 3.4*   < >  --  3.6 3.8 4.7 3.4* 3.7  CL 99   < >  --  99 102 104 100 99  CO2 26   < >  --  26 27 25 27 24   GLUCOSE 82   < >  --  138* 129* 132* 127* 138*  BUN 18   < >  --  17 22* 33* 20 35*  CREATININE 2.86*   < >  --  2.16* 2.57* 4.01* 2.65* 3.85*  CALCIUM 8.3*   < >  --  8.2* 8.4* 8.9 8.6* 8.5*  MG  --   --   --  1.6* 1.7 2.8* 1.9  --   PHOS 3.9  --  9.1*  --   --   --  3.8  --    < > = values in this interval  not displayed.   Liver Function Tests: Recent Labs  Lab 08/04/19 0845 08/05/19 0431 08/06/19 0422  AST  --  117* 79*  ALT  --  67* 66*  ALKPHOS  --  94 75  BILITOT  --  1.2 1.7*  PROT  --  6.2* 5.8*  ALBUMIN 2.7* 2.6* 2.4*   CBC: Recent Labs  Lab 08/04/19 0349 08/05/19 0431 08/05/19 1132 08/05/19 1819  08/06/19 1958 08/07/19 0312 08/08/19 0347 08/09/19 0438 08/10/19 0454  WBC 10.1 12.3* 12.1* 8.0   < > 8.2 7.4 11.2* 11.7* 9.5  NEUTROABS 8.0* 10.7* 9.9* 7.1  --  7.0  --   --   --   --   HGB 9.2* 8.2* 8.2* 8.3*   < > 7.4* 7.7* 7.8* 8.1* 8.5*  HCT 28.8* 26.8* 26.3* 26.3*   < > 22.8* 23.5* 25.3* 25.3* 26.8*  MCV 100.0 104.3* 102.7* 102.3*   < > 97.9 97.1 102.8* 101.2* 100.4*  PLT 150 135* 134* 122*   < > 124* 122* 131* 140* 161   < > = values in this interval not displayed.   BNP (last 3 results) Recent Labs    07/20/19 1029 08/03/19 0925 08/05/19 0431  BNP >4,500.0*  >4,500.0* 3,772.0*     CBG: Recent Labs  Lab 08/09/19 1525 08/09/19 1956 08/10/19 0020 08/10/19 0449 08/10/19 0716  GLUCAP 108* 123* 114* 129* 114*    Recent Results (from the past 240 hour(s))  SARS Coronavirus 2 by RT PCR (hospital order, performed in [facility] hospital lab) Nasopharyngeal Nasopharyngeal Swab     Status: None   Collection Time: 08/03/19  9:25 AM   Specimen: Nasopharyngeal Swab  Result Value Ref Range Status   SARS Coronavirus 2 NEGATIVE NEGATIVE Final    Comment: (NOTE) If result is NEGATIVE SARS-CoV-2 target nucleic acids are NOT DETECTED. The SARS-CoV-2 RNA is generally detectable in upper and lower  respiratory specimens during the acute phase of infection. The lowest  concentration of SARS-CoV-2 viral copies this assay can detect is 250  copies / mL. A negative result does not preclude SARS-CoV-2 infection  and should not be used as the sole basis for treatment or other  patient management decisions.  A negative result may occur with  improper specimen collection / handling, submission of specimen other  than nasopharyngeal swab, presence of viral mutation(s) within the  areas targeted by this assay, and inadequate number of viral copies  (<250 copies / mL). A negative result must be combined with clinical  observations, patient history, and epidemiological information. If result is POSITIVE SARS-CoV-2 target nucleic acids are DETECTED. The SARS-CoV-2 RNA is generally detectable in upper and lower  respiratory specimens dur ing the acute phase of infection.  Positive  results are indicative of active infection with SARS-CoV-2.  Clinical  correlation with patient history and other diagnostic information is  necessary to determine patient infection status.  Positive results do  not rule out bacterial infection or co-infection with other viruses. If result is PRESUMPTIVE POSTIVE SARS-CoV-2 nucleic acids MAY BE PRESENT.   A presumptive positive  result was obtained on the submitted specimen  and confirmed on repeat testing.  While 2019 novel coronavirus  (SARS-CoV-2) nucleic acids may be present in the submitted sample  additional confirmatory testing may be necessary for epidemiological  and / or clinical management purposes  to differentiate between  SARS-CoV-2 and other Sarbecovirus currently known to infect humans.  If clinically indicated additional testing with an alternate test  methodology [phone]) is advised. The SARS-CoV-2 RNA is generally  detectable in upper and lower respiratory sp ecimens during the acute  phase of infection. The expected result is Negative. Fact Sheet for Patients:  StrictlyIdeas.no Fact Sheet for Healthcare Providers: BankingDealers.co.za This test is not yet approved or cleared by the [place] FDA and has been authorized for detection and/or diagnosis of SARS-CoV-2 by FDA under an Emergency Use Authorization (EUA).  This EUA will remain in effect (meaning this test can be used) for the duration of the COVID-19 declaration under Section 564(b)(1) of the Act, 21 U.S.C. section 360bbb-3(b)(1), unless the authorization is terminated or revoked sooner. Performed at [facility], [address] [place], [place] [address]   Blood culture (routine x 2)     Status: None   Collection Time: 08/03/19 10:05 AM   Specimen: BLOOD  Result Value Ref Range Status   Specimen Description BLOOD BLOOD RIGHT FOREARM  Final   Special Requests   Final    BOTTLES DRAWN AEROBIC AND ANAEROBIC Blood Culture adequate volume   Culture   Final    NO GROWTH 5 DAYS Performed at [facility], [address] [place], [place] [address]    Report Status 08/08/2019 FINAL  Final  MRSA PCR Screening     Status: None   Collection Time: 08/04/19  7:56 AM   Specimen: Nasopharyngeal  Result Value Ref Range Status   MRSA by PCR NEGATIVE NEGATIVE Final     Comment:        The GeneXpert MRSA Assay (FDA approved for NASAL specimens only), is one component of a comprehensive MRSA colonization surveillance program. It is not intended to diagnose MRSA infection nor to guide or monitor treatment for MRSA infections. Performed at [facility], [address] [place], [place] [address]   Urine Culture     Status: None   Collection Time: 08/05/19 10:14 AM   Specimen: Urine, Random  Result Value Ref Range Status   Specimen Description   Final    URINE, RANDOM Performed at [facility], [address], [place], [place] [address]    Special Requests   Final    NONE Performed at [facility], [address], [place], [place] [address]    Culture   Final    NO GROWTH Performed at [facility], [place] [address], [place], [place] [address]    Report Status 08/06/2019 FINAL  Final  Culture, respiratory (non-expectorated)     Status: None   Collection Time: 08/05/19  8:40 PM   Specimen: Tracheal Aspirate; Respiratory  Result Value Ref Range Status   Specimen Description   Final    TRACHEAL ASPIRATE Performed at [facility], [address] [place], [place] [address]    Special Requests   Final    Normal Performed at [facility], [address] [place], [place] [address]    Gram Stain   Final    FEW WBC PRESENT,BOTH PMN AND MONONUCLEAR NO ORGANISMS SEEN    Culture   Final    RARE Consistent with normal respiratory flora. Performed at [facility], [place] [address] [place], [place] [address]    Report Status 08/08/2019 FINAL  Final     Studies: Nm Pulmonary Perf And Vent  Result Date: 08/10/2019 CLINICAL DATA:  Shortness of breath EXAM: NUCLEAR MEDICINE VENTILATION - PERFUSION LUNG SCAN Views: Anterior, posterior, left lateral, right lateral, RPO, LPO, RAO, LAO-ventilation perfusion RADIOPHARMACEUTICALS:  9.9 mCi of Tc-51m DTPA aerosol inhalation and 4.34 mCi Tc16m MAA  IV COMPARISON:  Chest radiograph August 10, 2019 FINDINGS: Ventilation: No focal ventilation defects are appreciable. There is cardiomegaly which causes a degree of volume loss in the left lower lobe region. Perfusion: No focal perfusion defects are evident. Note that there is cardiomegaly. IMPRESSION: No appreciable ventilation or perfusion defects. Very low probability of pulmonary embolus. Cardiomegaly noted. Electronically Signed   By: Lowella Grip III M.D.   On: 08/10/2019 13:07   Dg Chest Port 1 View  Result Date: 08/10/2019 CLINICAL DATA:  Pt has a cough. Hx of hypertension, ESRD, perm cath. Current everyday smoker EXAM: PORTABLE CHEST 1 VIEW COMPARISON:  Radiograph 08/06/2011 FINDINGS: Large-bore central venous line unchanged. Interval extubation. Removal of NG tube. Stable enlarged cardiac silhouette. There is a moderate LEFT effusion similar prior. Central venous congestion slightly increased. Vascular stent noted in the LEFT axilla IMPRESSION: 1. Extubation without complication. 2. Increase in central venous congestion. 3. Stable LEFT pleural effusion. Electronically Signed   By: Suzy Bouchard M.D.   On: 08/10/2019 11:57    Scheduled Meds: . aspirin  81 mg Per Tube Daily  . Chlorhexidine Gluconate Cloth  6 each Topical Q0600  . epoetin (EPOGEN/PROCRIT) injection  10,000 Units Intravenous Q T,Th,Sa-HD  . hydrocortisone sod succinate (SOLU-CORTEF) inj  100 mg Intravenous Q12H  . insulin aspart  0-9 Units Subcutaneous Q4H  . pantoprazole (PROTONIX) IV  40 mg Intravenous QHS   Continuous Infusions: . sodium chloride    . sodium chloride    . sodium chloride    . famotidine (PEPCID) IV Stopped (08/09/19 1913)  . heparin 800 Units/hr (08/09/19 2341)  . levETIRAcetam 500 mg (08/10/19 0921)    Assessment/Plan:  1. PEA arrest.  Patient had return of spontaneous circulation following ACLS.    Patient is a DNR.  2. Acute hypoxic and hypercapnic respiratory failure.  Patient  extubated 08/07/2019 and placed on nasal cannula..  On cefepime.  VQ scan negative for pulmonary embolism 3. Acute on chronic systolic congestive heart failure with EF less than 20.  Dialysis to manage fluid. 4. End-stage renal disease on hemodialysis.  Tuesday Thursday Saturday schedule.  Dialysis scheduled for tomorrow creatinine 4-2.65 5. Possible seizure on Keppra.  Continue Keppra 500 twice daily.  EEG with diffuse slowing without any sharps.  Neurology recommended reimaging of the brain in a few days.  Patient following commands. 6. 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  Plans to do hero graft on the right side on Wednesday followed by angio for  the left on Thursday 7. History of PAD- vascular surgery follow-up Plan of care discussed with the patient and intensivist Code Status:     Code Status Orders  (From admission, onward)         Start     Ordered   08/05/19 0653  Do not attempt resuscitation (DNR)  Continuous    Question Answer Comment  In the event of cardiac or respiratory ARREST Do not call a "code blue"   In the event of cardiac or respiratory ARREST Do not perform Intubation, CPR, defibrillation or ACLS   In the event of cardiac or respiratory ARREST Use medication by any route, position, wound care, and other measures to relive pain and suffering. May use oxygen, suction and manual treatment of airway obstruction as needed for comfort.      08/05/19 [id number]        Code Status History  Date Active Date Inactive Code Status Order ID Comments User Context   08/05/2019 0613 08/05/2019 0652 Full Code [id number]  [name], NP Inpatient   08/04/2019 0127 08/05/2019 0613 Full Code [id number]  Mansy, Arvella Merles, MD ED   07/27/2019 1110 07/29/2019 1719 Full Code [id number]  Hillary Bow, MD ED   07/20/2019 2041 07/23/2019 1929 DNR [id number]  Vaughan Basta, MD Inpatient   07/09/2019 0543 07/12/2019 1502 Full Code [id number]  Harrie Foreman, MD Inpatient    07/02/2019 2128 07/06/2019 1557 Full Code [id number]  Henreitta Leber, MD Inpatient   05/19/2019 0603 05/22/2019 1957 Full Code [id number]  Lance Coon, MD Inpatient   02/28/2019 1805 03/03/2019 2234 Full Code [id number]  Vaughan Basta, MD Inpatient   02/21/2019 0835 02/24/2019 1945 Full Code [id number]  Harrie Foreman, MD Inpatient   02/04/2019 1343 02/09/2019 2050 Full Code [id number]  Saundra Shelling, MD ED   01/27/2019 0654 01/29/2019 2049 Partial Code [id number]  Harrie Foreman, MD Inpatient   01/07/2019 2156 01/27/2019 0638 Partial Code [id number]  Tennis Ship, MD Inpatient   12/25/2018 0158 01/07/2019 2027 Partial Code [id number]  Ina Homes, MD ED   Advance Care Planning Activity     Family Communication: As per critical care specialist Disposition Plan: To be determined  Consultants:  Critical care specialist  Cardiology  Nephrology  Antibiotics:  Cefepime  Time spent: 33 minutes.  Case discussed with respiratory, critical care specialist and nursing staff.  Nicholes Mango  Brett Small

## 2019-08-10 NOTE — Consult Note (Signed)
[name] for Electrolyte Monitoring and Replacement   Recent Labs: Potassium (mmol/L)  Date Value  08/10/2019 3.7   Magnesium (mg/dL)  Date Value  08/09/2019 1.9   Calcium (mg/dL)  Date Value  08/10/2019 8.5 (L)   Albumin (g/dL)  Date Value  08/06/2019 2.4 (L)   Phosphorus (mg/dL)  Date Value  08/09/2019 3.8   Sodium (mmol/L)  Date Value  08/10/2019 137   Corrected Ca: 9.68 mg/dL  Assessment: 43 y.o. male admitted on 08/03/2019 with sepsis. On 10/14, pt became suddenly altered and then unresponsive with agonal respirations, leading to respiratory arrest with bradycardia and eventual PEA arrest requiring approximately 10 minutes of CPR/ACLS before ROSC was achieved. Pt was intubated during arrest and remains intubated and sedated.  Goal of Therapy:  Due to cardiac history: Potassium 4.0 - 5.1 mmol/L Magnesium 2.0 - 2.4 mg/dL Other electrolytes WNL  Plan:  No electrolyte supplement needed today.   Oswald Hillock ,PharmD, BCPS Clinical Pharmacist 08/10/2019 10:22 AM

## 2019-08-11 DIAGNOSIS — N186 End stage renal disease: Secondary | ICD-10-CM

## 2019-08-11 DIAGNOSIS — T82898A Other specified complication of vascular prosthetic devices, implants and grafts, initial encounter: Secondary | ICD-10-CM | POA: Diagnosis not present

## 2019-08-11 DIAGNOSIS — Z992 Dependence on renal dialysis: Secondary | ICD-10-CM

## 2019-08-11 LAB — GLUCOSE, CAPILLARY
Glucose-Capillary: 118 mg/dL — ABNORMAL HIGH (ref 70–99)
Glucose-Capillary: 96 mg/dL (ref 70–99)
Glucose-Capillary: 122 mg/dL — ABNORMAL HIGH (ref 70–99)
Glucose-Capillary: 130 mg/dL — ABNORMAL HIGH (ref 70–99)
Glucose-Capillary: 132 mg/dL — ABNORMAL HIGH (ref 70–99)

## 2019-08-11 LAB — RENAL FUNCTION PANEL
Albumin: 2.9 g/dL — ABNORMAL LOW (ref 3.5–5.0)
Anion gap: 13 (ref 5–15)
BUN: 46 mg/dL — ABNORMAL HIGH (ref 6–20)
CO2: 23 mmol/L (ref 22–32)
Calcium: 8.6 mg/dL — ABNORMAL LOW (ref 8.9–10.3)
Chloride: 101 mmol/L (ref 98–111)
Creatinine, Ser: 4.67 mg/dL — ABNORMAL HIGH (ref 0.61–1.24)
GFR calc Af Amer: 16 mL/min — ABNORMAL LOW (ref >60)
GFR calc non Af Amer: 14 mL/min — ABNORMAL LOW (ref >60)
Glucose, Bld: 115 mg/dL — ABNORMAL HIGH (ref 70–99)
Phosphorus: 4.4 mg/dL (ref 2.5–4.6)
Potassium: 3.5 mmol/L (ref 3.5–5.1)
Sodium: 137 mmol/L (ref 135–145)

## 2019-08-11 LAB — HEPARIN LEVEL (UNFRACTIONATED)
Heparin Unfractionated: 0.2 IU/mL — ABNORMAL LOW (ref 0.30–0.70)
Heparin Unfractionated: 0.23 IU/mL — ABNORMAL LOW (ref 0.30–0.70)

## 2019-08-11 LAB — CBC
HCT: 27.9 % — ABNORMAL LOW (ref 39.0–52.0)
Hemoglobin: 8.9 g/dL — ABNORMAL LOW (ref 13.0–17.0)
MCH: 32.2 pg (ref 26.0–34.0)
MCHC: 31.9 g/dL (ref 30.0–36.0)
MCV: 101.1 fL — ABNORMAL HIGH (ref 80.0–100.0)
Platelets: 186 10*3/uL (ref 150–400)
RBC: 2.76 MIL/uL — ABNORMAL LOW (ref 4.22–5.81)
RDW: 18.2 % — ABNORMAL HIGH (ref 11.5–15.5)
WBC: 11 10*3/uL — ABNORMAL HIGH (ref 4.0–10.5)
nRBC: 2 % — ABNORMAL HIGH (ref 0.0–0.2)

## 2019-08-11 LAB — APTT
aPTT: 49 seconds — ABNORMAL HIGH (ref 24–36)
aPTT: 155 seconds — ABNORMAL HIGH (ref 24–36)
aPTT: 55 seconds — ABNORMAL HIGH (ref 24–36)

## 2019-08-11 LAB — MAGNESIUM: Magnesium: 2.1 mg/dL (ref 1.7–2.4)

## 2019-08-11 MED ORDER — ZOLPIDEM TARTRATE 5 MG PO TABS
5.0000 mg | ORAL_TABLET | Freq: Every evening | ORAL | Status: DC | PRN
  Administered 2019-08-11 – 2019-08-16 (×4): 5 mg via ORAL
  Filled 2019-08-11 (×4): qty 1

## 2019-08-11 MED ORDER — INSULIN ASPART 100 UNIT/ML ~~LOC~~ SOLN
0.0000 [IU] | Freq: Four times a day (QID) | SUBCUTANEOUS | Status: DC
  Administered 2019-08-11: 23:00:00 1 [IU] via SUBCUTANEOUS
  Administered 2019-08-12: 2 [IU] via SUBCUTANEOUS
  Administered 2019-08-12: 1 [IU] via SUBCUTANEOUS
  Administered 2019-08-14: 2 [IU] via SUBCUTANEOUS
  Filled 2019-08-11 (×4): qty 1

## 2019-08-11 MED ORDER — HEPARIN (PORCINE) 25000 UT/250ML-% IV SOLN
850.0000 [IU]/h | INTRAVENOUS | Status: DC
  Administered 2019-08-11: 850 [IU]/h via INTRAVENOUS

## 2019-08-11 MED ORDER — POTASSIUM CHLORIDE 20 MEQ PO PACK
40.0000 meq | PACK | Freq: Once | ORAL | Status: AC
  Administered 2019-08-11: 40 meq via ORAL
  Filled 2019-08-11: qty 2

## 2019-08-11 MED ORDER — CEFAZOLIN SODIUM-DEXTROSE 1-4 GM/50ML-% IV SOLN
1.0000 g | INTRAVENOUS | Status: AC
  Administered 2019-08-12: 1 g via INTRAVENOUS
  Filled 2019-08-11: qty 50

## 2019-08-11 NOTE — Progress Notes (Addendum)
[facility] Daily Progress Note   Subjective: 07/28/19:  1. Contrast injection left brachial axillary AV graft  The patient is a 14 year oldmaleknow to our service admittedon 10/12/2020with sepsis. On 10/14, the patient became suddenly altered and then unresponsive with agonal respirations, leading to respiratory arrest with bradycardia and eventual PEA arrest requiring approximately 10 minutes of CPR/ACLS before ROSC was achieved. Pt was intubated during arrest. Extubated on 08/07/19. Now transferred out of ICU to floor.  Seen and examined during dialysis treatment.  Patient is alert and oriented. Answered appropriately. Without complaint this AM.  Objective: Vitals:   08/11/19 0714 08/11/19 0929 08/11/19 0930 08/11/19 0945  BP: (!) 151/112 (!) 156/116 (!) 159/117 (!) 159/104  Pulse: (!) 105  (!) 108 (!) 107  Resp: 19 (!) 25 (!) 25 (!) 24  Temp: 98.4 F (36.9 C)     TempSrc: Oral     SpO2: 93%  93% 94%  Weight:      Height:        Intake/Output Summary (Last 24 hours) at 08/11/2019 1045 Last data filed at 08/11/2019 0321 Gross per 24 hour  Intake 240 ml  Output 100 ml  Net 140 ml   Physical Exam: A&Ox3, NAD Chest:   Right IJ Permcath: Intact, clean and dry. No signs of infection. Functioning well. 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    Laboratory: CBC    Component Value Date/Time   WBC 11.0 (H) 08/11/2019 0538   HGB 8.9 (L) 08/11/2019 0538   HCT 27.9 (L) 08/11/2019 0538   PLT 186 08/11/2019 0538   BMET    Component Value Date/Time   NA 137 08/11/2019 0538   K 3.5 08/11/2019 0538   CL 101 08/11/2019 0538   CO2 23 08/11/2019 0538   GLUCOSE 115 (H) 08/11/2019 0538   BUN 46 (H) 08/11/2019 0538   CREATININE 4.67 (H) 08/11/2019 0538   CALCIUM 8.6 (L) 08/11/2019 0538   GFRNONAA 14 (L) 08/11/2019 0538   GFRAA 16 (L)  08/11/2019 0538   Assessment/Planning: The patient is a 43 year old male with multiple medical issues including end-stage renal disease on chronic hemodialysis recently admitted for sepsis with subsequent cardiac arrest requiring intubation.  Patient is now extubated and transferred out of the ICU to the floor. 1) Left upper extremity dialysis access: The patient underwent a recent shuntogram on July 28, 2019 by Dr. Delana Meyer. Findings: "Based on the images, the graft is indeed thrombosed.  The central veins appear widely patent beyond the level of the previously placed via bond stents.  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  Visualized portions of the brachial artery are widely patent.  This will require surgical revision."  We will plan for a repeat left upper extremity fistulogram on Thursday with Dr. Lucky Cowboy and place a covered stent to the aneurysmal area.  Procedure, risks and benefits explained to the patient.  All questions answered.  The patient wishes to proceed. 2) Need for new access: We will plan on placing a right upper extremity fistula versus graft dependent on the patient's anatomy tomorrow with Dr. Lucky Cowboy.  If a HeRO is placed we will also plan on moving the right IJ PermCath to the left.  Procedure, risk and benefits explained to the patient.  All questions answered.  The patient wishes to proceed. 3) Will pre-op. Heparin to  stop at 6am.  Discussed with Dr. Ellis Parents [facility] PA-C 08/11/2019 10:45 AM

## 2019-08-11 NOTE — Progress Notes (Signed)
Post HD Assessment    08/11/19 1233  Neurological  Level of Consciousness Alert  Orientation Level Oriented X4  Respiratory  Respiratory Pattern Regular;Unlabored  Chest Assessment Chest expansion symmetrical  Bilateral Breath Sounds Clear;Diminished  Cough None  Cardiac  Pulse Regular  Heart Sounds S1, S2  ECG Monitor Yes  Cardiac Rhythm ST  Ectopy Unifocal PVC's  Ectopy Frequency Occasional  Vascular  R Radial Pulse +2  L Radial Pulse +2  Edema Generalized  Psychosocial  Psychosocial (WDL) WDL

## 2019-08-11 NOTE — Consult Note (Signed)
[facility] for Electrolyte Monitoring and Replacement   Recent Labs: Potassium (mmol/L)  Date Value  08/11/2019 3.5   Magnesium (mg/dL)  Date Value  08/11/2019 2.1   Calcium (mg/dL)  Date Value  08/11/2019 8.6 (L)   Albumin (g/dL)  Date Value  08/11/2019 2.9 (L)   Phosphorus (mg/dL)  Date Value  08/11/2019 4.4   Sodium (mmol/L)  Date Value  08/11/2019 137   Corrected Ca: 9.68 mg/dL  Assessment: 43 y.o. male admitted on 08/03/2019 with sepsis. On 10/14, pt became suddenly altered and then unresponsive with agonal respirations, leading to respiratory arrest with bradycardia and eventual PEA arrest requiring approximately 10 minutes of CPR/ACLS before ROSC was achieved. Pt was intubated during arrest, but has since been extubated  Goal of Therapy:  Due to cardiac history: Potassium 4.0 - 5.1 mmol/L Magnesium 2.0 - 2.4 mg/dL Other electrolytes WNL  Plan:  KCl 3.5, goal ~4.  Will give KCl 40 mEq x 1 dose, as patient is slightly subtherapeutic and going to HD today. Follow electrolytes in the AM.  Gerald Dexter, PharmD Pharmacy Resident  08/11/2019 1:18 PM

## 2019-08-11 NOTE — Progress Notes (Signed)
[facility]  ROUNDING NOTE   Subjective:   Seen and examined on hemodialysis. Tolerating treatment well.     HEMODIALYSIS FLOWSHEET:  Blood Flow Rate (mL/min): 350 mL/min Arterial Pressure (mmHg): -110 mmHg Venous Pressure (mmHg): 80 mmHg Transmembrane Pressure (mmHg): 80 mmHg Ultrafiltration Rate (mL/min): 500 mL/min Dialysate Flow Rate (mL/min): 800 ml/min Conductivity: Machine : 14 Conductivity: Machine : 14 Dialysis Fluid Bolus: Normal Saline Bolus Amount (mL): 250 mL Dialysate Change: 4K     Objective:  Vital signs in last 24 hours:  Temp:  [97.6 F (36.4 C)-98.6 F (37 C)] 98.4 F (36.9 C) (10/20 0714) Pulse Rate:  [95-108] 106 (10/20 1030) Resp:  [18-25] 25 (10/20 1000) BP: (144-159)/(87-117) 154/94 (10/20 1030) SpO2:  [87 %-100 %] 96 % (10/20 1030) Weight:  [46.7 kg] 46.7 kg (10/20 0311)  Weight change: 0.544 kg Filed Weights   08/09/19 1959 08/10/19 0102 08/11/19 0311  Weight: 46.2 kg 45.7 kg 46.7 kg    Intake/Output: I/O last 3 completed shifts: In: 795.5 [P.O.:600; I.V.:45.5; IV Piggyback:150] Out: 100 [Urine:100]   Intake/Output this shift:  No intake/output data recorded.  Physical Exam: General: No acute distress  Head: [place]/AT    Eyes: Anicteric  Neck: Supple, trachea midline  Lungs:  Clear to auscultation, normal effort  Heart: regular  Abdomen:  Soft, nontender, bowel sounds present  Extremities: No peripheral edema.  Neurologic: Awake, alert, follows commands  Skin: No lesions  Access: LUE AVF - anuerysm, Right IJ PC,        Basic Metabolic Panel: Recent Labs  Lab 08/06/19 1124 08/06/19 1958 08/07/19 0312 08/08/19 0347 08/09/19 0438 08/10/19 0454 08/11/19 0538  NA  --  135 137 140 139 137 137  K  --  3.6 3.8 4.7 3.4* 3.7 3.5  CL  --  99 102 104 100 99 101  CO2  --  26 27 25 27 24 23   GLUCOSE  --  138* 129* 132* 127* 138* 115*  BUN  --  17 22* 33* 20 35* 46*  CREATININE  --  2.16* 2.57* 4.01* 2.65* 3.85*  4.67*  CALCIUM  --  8.2* 8.4* 8.9 8.6* 8.5* 8.6*  MG  --  1.6* 1.7 2.8* 1.9  --  2.1  PHOS 9.1*  --   --   --  3.8  --  4.4    Liver Function Tests: Recent Labs  Lab 08/05/19 0431 08/06/19 0422 08/11/19 0538  AST 117* 79*  --   ALT 67* 66*  --   ALKPHOS 94 75  --   BILITOT 1.2 1.7*  --   PROT 6.2* 5.8*  --   ALBUMIN 2.6* 2.4* 2.9*   No results for input(s): LIPASE, AMYLASE in the last 168 hours. No results for input(s): AMMONIA in the last 168 hours.  CBC: Recent Labs  Lab 08/05/19 0431 08/05/19 1132 08/05/19 1819  08/06/19 1958 08/07/19 0312 08/08/19 0347 08/09/19 0438 08/10/19 0454 08/11/19 0538  WBC 12.3* 12.1* 8.0   < > 8.2 7.4 11.2* 11.7* 9.5 11.0*  NEUTROABS 10.7* 9.9* 7.1  --  7.0  --   --   --   --   --   HGB 8.2* 8.2* 8.3*   < > 7.4* 7.7* 7.8* 8.1* 8.5* 8.9*  HCT 26.8* 26.3* 26.3*   < > 22.8* 23.5* 25.3* 25.3* 26.8* 27.9*  MCV 104.3* 102.7* 102.3*   < > 97.9 97.1 102.8* 101.2* 100.4* 101.1*  PLT 135* 134* 122*   < >  124* 122* 131* 140* 161 186   < > = values in this interval not displayed.    Cardiac Enzymes: No results for input(s): CKTOTAL, CKMB, CKMBINDEX, TROPONINI in the last 168 hours.  BNP: Invalid input(s): POCBNP  CBG: Recent Labs  Lab 08/10/19 1652 08/10/19 2148 08/10/19 2354 08/11/19 0421 08/11/19 0715  GLUCAP 88 109* 93 118* 64    Microbiology: Results for orders placed or performed during the hospital encounter of 08/03/19  SARS Coronavirus 2 by RT PCR (hospital order, performed in [facility] hospital lab) Nasopharyngeal Nasopharyngeal Swab     Status: None   Collection Time: 08/03/19  9:25 AM   Specimen: Nasopharyngeal Swab  Result Value Ref Range Status   SARS Coronavirus 2 NEGATIVE NEGATIVE Final    Comment: (NOTE) If result is NEGATIVE SARS-CoV-2 target nucleic acids are NOT DETECTED. The SARS-CoV-2 RNA is generally detectable in upper and lower  respiratory specimens during the acute phase of infection. The lowest   concentration of SARS-CoV-2 viral copies this assay can detect is 250  copies / mL. A negative result does not preclude SARS-CoV-2 infection  and should not be used as the sole basis for treatment or other  patient management decisions.  A negative result may occur with  improper specimen collection / handling, submission of specimen other  than nasopharyngeal swab, presence of viral mutation(s) within the  areas targeted by this assay, and inadequate number of viral copies  (<250 copies / mL). A negative result must be combined with clinical  observations, patient history, and epidemiological information. If result is POSITIVE SARS-CoV-2 target nucleic acids are DETECTED. The SARS-CoV-2 RNA is generally detectable in upper and lower  respiratory specimens dur ing the acute phase of infection.  Positive  results are indicative of active infection with SARS-CoV-2.  Clinical  correlation with patient history and other diagnostic information is  necessary to determine patient infection status.  Positive results do  not rule out bacterial infection or co-infection with other viruses. If result is PRESUMPTIVE POSTIVE SARS-CoV-2 nucleic acids MAY BE PRESENT.   A presumptive positive result was obtained on the submitted specimen  and confirmed on repeat testing.  While 2019 novel coronavirus  (SARS-CoV-2) nucleic acids may be present in the submitted sample  additional confirmatory testing may be necessary for epidemiological  and / or clinical management purposes  to differentiate between  SARS-CoV-2 and other Sarbecovirus currently known to infect humans.  If clinically indicated additional testing with an alternate test  methodology [phone]) is advised. The SARS-CoV-2 RNA is generally  detectable in upper and lower respiratory sp ecimens during the acute  phase of infection. The expected result is Negative. Fact Sheet for Patients:  StrictlyIdeas.no Fact Sheet  for Healthcare Providers: BankingDealers.co.za This test is not yet approved or cleared by the [place] FDA and has been authorized for detection and/or diagnosis of SARS-CoV-2 by FDA under an Emergency Use Authorization (EUA).  This EUA will remain in effect (meaning this test can be used) for the duration of the COVID-19 declaration under Section 564(b)(1) of the Act, 21 U.S.C. section 360bbb-3(b)(1), unless the authorization is terminated or revoked sooner. Performed at [facility], [address] [place], [place] [address]   Blood culture (routine x 2)     Status: None   Collection Time: 08/03/19 10:05 AM   Specimen: BLOOD  Result Value Ref Range Status   Specimen Description BLOOD BLOOD RIGHT FOREARM  Final   Special Requests  Final    BOTTLES DRAWN AEROBIC AND ANAEROBIC Blood Culture adequate volume   Culture   Final    NO GROWTH 5 DAYS Performed at [facility], [address] [place], [place] [address]    Report Status 08/08/2019 FINAL  Final  MRSA PCR Screening     Status: None   Collection Time: 08/04/19  7:56 AM   Specimen: Nasopharyngeal  Result Value Ref Range Status   MRSA by PCR NEGATIVE NEGATIVE Final    Comment:        The GeneXpert MRSA Assay (FDA approved for NASAL specimens only), is one component of a comprehensive MRSA colonization surveillance program. It is not intended to diagnose MRSA infection nor to guide or monitor treatment for MRSA infections. Performed at [facility], [address] [place], [place] [address]   Urine Culture     Status: None   Collection Time: 08/05/19 10:14 AM   Specimen: Urine, Random  Result Value Ref Range Status   Specimen Description   Final    URINE, RANDOM Performed at [facility], [address] [place], [place] [address]    Special Requests   Final    NONE Performed at [facility], [address] [place], [place] [address]     Culture   Final    NO GROWTH Performed at [facility], [place] [address], [place], [place] [address]    Report Status 08/06/2019 FINAL  Final  Culture, respiratory (non-expectorated)     Status: None   Collection Time: 08/05/19  8:40 PM   Specimen: Tracheal Aspirate; Respiratory  Result Value Ref Range Status   Specimen Description   Final    TRACHEAL ASPIRATE Performed at [facility], [address] [place], [place] [address]    Special Requests   Final    Normal Performed at [facility], [address] [place], [place] [address]    Gram Stain   Final    FEW WBC PRESENT,BOTH PMN AND MONONUCLEAR NO ORGANISMS SEEN    Culture   Final    RARE Consistent with normal respiratory flora. Performed at [facility], [place] [address], [place], [place] [address]    Report Status 08/08/2019 FINAL  Final    Coagulation Studies: No results for input(s): LABPROT, INR in the last 72 hours.  Urinalysis: No results for input(s): COLORURINE, LABSPEC, PHURINE, GLUCOSEU, HGBUR, BILIRUBINUR, KETONESUR, PROTEINUR, UROBILINOGEN, NITRITE, LEUKOCYTESUR in the last 72 hours.  Invalid input(s): APPERANCEUR    Imaging: Nm Pulmonary Perf And Vent  Result Date: 08/10/2019 CLINICAL DATA:  Shortness of breath EXAM: NUCLEAR MEDICINE VENTILATION - PERFUSION LUNG SCAN Views: Anterior, posterior, left lateral, right lateral, RPO, LPO, RAO, LAO-ventilation perfusion RADIOPHARMACEUTICALS:  9.9 mCi of Tc-22m DTPA aerosol inhalation and 4.34 mCi Tc57m MAA IV COMPARISON:  Chest radiograph August 10, 2019 FINDINGS: Ventilation: No focal ventilation defects are appreciable. There is cardiomegaly which causes a degree of volume loss in the left lower lobe region. Perfusion: No focal perfusion defects are evident. Note that there is cardiomegaly. IMPRESSION: No appreciable ventilation or perfusion defects. Very low probability of pulmonary embolus. Cardiomegaly noted. Electronically  Signed   By: Lowella Grip III M.D.   On: 08/10/2019 13:07   Dg Chest Port 1 View  Result Date: 08/10/2019 CLINICAL DATA:  Pt has a cough. Hx of hypertension, ESRD, perm cath. Current everyday smoker EXAM: PORTABLE CHEST 1 VIEW COMPARISON:  Radiograph 08/06/2011 FINDINGS: Large-bore central venous  line unchanged. Interval extubation. Removal of NG tube. Stable enlarged cardiac silhouette. There is a moderate LEFT effusion similar prior. Central venous congestion slightly increased. Vascular stent noted in the LEFT axilla IMPRESSION: 1. Extubation without complication. 2. Increase in central venous congestion. 3. Stable LEFT pleural effusion. Electronically Signed   By: Suzy Bouchard M.D.   On: 08/10/2019 11:57     Medications:   . sodium chloride    . sodium chloride    . heparin 950 Units/hr (08/11/19 0656)  . levETIRAcetam 500 mg (08/10/19 2252)   . aspirin  81 mg Per Tube Daily  . Chlorhexidine Gluconate Cloth  6 each Topical Q0600  . epoetin (EPOGEN/PROCRIT) injection  10,000 Units Intravenous Q T,Th,Sa-HD  . feeding supplement (NEPRO CARB STEADY)  237 mL Oral BID BM  . hydrocortisone sod succinate (SOLU-CORTEF) inj  100 mg Intravenous Q12H  . insulin aspart  0-9 Units Subcutaneous Q4H  . multivitamin  1 tablet Oral QHS  . pantoprazole  20 mg Oral Daily  . pantoprazole (PROTONIX) IV  40 mg Intravenous QHS   sodium chloride, sodium chloride, acetaminophen, albuterol, alteplase, heparin, labetalol, lidocaine (PF), lidocaine-prilocaine, nitroGLYCERIN, ondansetron (ZOFRAN) IV, pentafluoroprop-tetrafluoroeth, promethazine  Assessment/ Plan:  43 y.o.white male with end-stage renal disease on hemodialysis, hypertension, peripheral vascular disease, depression.  [place] TTS  Left AVG  1.  ESRD on HD TTS.  Seen and examined on hemodialysis - with complication of dialysis device - anuerysmal AVG - Vascular for revision tomorrow, 10/21  2.  Acute respiratory  failure/PEA arrest.  V/Q was low probablility - heparin gtt. Appreciate cardiology and neurology input - Started on Keppra for possible seizure.   3.  Anemia of chronic kidney disease. - EPO with HD treatment  4.  Secondary hyperparathyroidism: not currently on binders.   5. Hypertension:  - IV labetalol PRN   LOS: 7 Giovanne Nickolson 10/20/202010:47 AM

## 2019-08-11 NOTE — Progress Notes (Signed)
ANTICOAGULATION CONSULT NOTE  Pharmacy Consult for Heparin Indication: ACS/STEMI  Patient Measurements: Height: 5\' 1"  (154.9 cm) Weight: 103 lb (46.7 kg) IBW/kg (Calculated) : 52.3 HEPARIN DW (KG): 46.2  Vital Signs: Temp: 98.6 F (37 C) (10/20 0311) Temp Source: Oral (10/20 0311) BP: 151/103 (10/20 0311) Pulse Rate: 102 (10/20 0311)  Labs: Recent Labs    08/08/19 2221 08/08/19 2355  08/09/19 0438 08/10/19 0454 08/11/19 0538  HGB  --   --    < > 8.1* 8.5* 8.9*  HCT  --   --   --  25.3* 26.8* 27.9*  PLT  --   --   --  140* 161 186  APTT  --   --   --  71* 92* 49*  HEPARINUNFRC  --   --   --   --   --  0.20*  CREATININE  --   --   --  2.65* 3.85*  --   TROPONINIHS 101* 127*  --   --   --   --    < > = values in this interval not displayed.   Estimated Creatinine Clearance: 16 mL/min (A) (by C-G formula based on SCr of 3.85 mg/dL (H)).  Medical History: Past Medical History:  Diagnosis Date  . ESRD (end stage renal disease) ([place])   . Hypertension   . PAD (peripheral artery disease) ([facility])    Required aortofemoral stent-which had closed and had to redo the procedure and ischemia of limb.  . Peripheral vascular disease ([place])   . Renal disorder   . Secondary hyperparathyroidism of renal origin [facility])    Medications:  Medications Prior to Admission  Medication Sig Dispense Refill Last Dose  . amLODipine (NORVASC) 10 MG tablet Take 1 tablet (10 mg total) by mouth daily at 8 pm. 30 tablet 2 unknown at unknown  . apixaban (ELIQUIS) 2.5 MG TABS tablet Take 1 tablet (2.5 mg total) by mouth 2 (two) times daily. 60 tablet 1 unknown at unknown  . aspirin EC 81 MG tablet Take 1 tablet (81 mg total) by mouth daily. 30 tablet 1 unknown at unknown  . atorvastatin (LIPITOR) 80 MG tablet Take 1 tablet (80 mg total) by mouth at bedtime. 30 tablet 1 unknown at unknown  . b complex-C-folic acid 1 MG capsule Take 1 capsule by mouth daily after supper.    unknown at unknown  . carvedilol  (COREG) 25 MG tablet Take 1 tablet (25 mg total) by mouth 2 (two) times daily with a meal. 60 tablet 1 unknown at unknown  . gabapentin (NEURONTIN) 100 MG capsule Take 1 capsule (100 mg total) by mouth 3 (three) times daily. 90 capsule 1 unknown at unknown  . hydrALAZINE (APRESOLINE) 25 MG tablet Take 1 tablet (25 mg total) by mouth every 8 (eight) hours. 90 tablet 1 unknown at unknown  . hydrOXYzine (ATARAX/VISTARIL) 25 MG tablet Take 25 mg by mouth 3 (three) times daily as needed for anxiety.   unknown at unknown  . loratadine (CLARITIN) 10 MG tablet Take 1 tablet (10 mg total) by mouth daily. 30 tablet 0 unknown at unknown  . multivitamin (RENA-VIT) TABS tablet Take 1 tablet by mouth daily. 30 each 1 unknown at unknown  . neomycin-bacitracin-polymyxin (NEOSPORIN) OINT Apply 1 application topically 2 (two) times daily. 56 g 1 unknown at unknown  . Nutritional Supplements (FEEDING SUPPLEMENT, NEPRO CARB STEADY,) LIQD Take 237 mLs by mouth 2 (two) times daily between meals. 237 mL 30 unknown at unknown  .  sevelamer carbonate (RENVELA) 800 MG tablet Take 2 tablets (1,600 mg total) by mouth 3 (three) times daily with meals. 180 tablet 1 unknown at unknown    Assessment: Pharmacy asked to initiate Heparin protocol for a suspected PE vs CVA s/p MI this am.  Pt has been on Apixaban w/ last dose being given on 10/13 at 2117.  H&H, PLT trending down: continue to monitor  Heparin Course: 10/14 am initiation: 1000 unit bolus, then 700 units/hr 10/14 1318: inc to 750 units/hr 10/14 2325 aPTT = 89, therapeutic 10/15 0422 aPTT = 77, therapeutic x 2.  CBC stable.  10/16 0312 aPTT = 66, therapeutic x 3.  CBC stable.  10/17 0347 aPTT = 72, therapeutic x 4.  CBC stable.  10/18 0438 aPTT = 71, therapeutic x 5.  CBC stable.  10/19 0454 aPTT = 92, therapeutic x 6.  CBC stable.   Goal of Therapy:  aPTT 66-102 seconds Monitor platelets by anticoagulation protocol: Yes   Plan:  10/20 @ 0500 aPTT 49 seconds,  HL 0.20 both subtherapeutic and not correlating. Will increase rate to 950 units/hr and will recheck aPTT @ 1300 w/ HL w/ am labs, CBC stable will continue to monitor.  Tobie Lords, PharmD 08/11/2019,6:53 AM

## 2019-08-11 NOTE — Progress Notes (Signed)
Post HD Tx    08/11/19 1233  Vital Signs  Temp 98.4 F (36.9 C)  Temp Source Oral  Pulse Rate 98  Pulse Rate Source Monitor  Resp 20  BP (!) 142/100  BP Location Right Arm  BP Method Automatic  Patient Position (if appropriate) Lying  Oxygen Therapy  SpO2 100 %  O2 Device Nasal Cannula  O2 Flow Rate (L/min) 2 L/min  Pulse Oximetry Type Continuous  Pain Assessment  Pain Scale 0-10  Pain Score 0  Dialysis Weight  Weight 45.5 kg  Type of Weight Post-Dialysis  Post-Hemodialysis Assessment  Rinseback Volume (mL) 250 mL  KECN 59.2 V  Dialyzer Clearance Lightly streaked  Duration of HD Treatment -hour(s) 2.75 hour(s)  Hemodialysis Intake (mL) 500 mL  UF Total -Machine (mL) 1904 mL  Net UF (mL) 1404 mL  Tolerated HD Treatment Yes  Hemodialysis Catheter Right Internal jugular Double lumen Temporary (Non-Tunneled)  No Placement Date or Time found.   Placed prior to admission: Yes  Orientation: Right  Access Location: Internal jugular  Hemodialysis Catheter Type: Double lumen Temporary (Non-Tunneled)  Site Condition No complications  Blue Lumen Status Heparin locked  Red Lumen Status Heparin locked  Post treatment catheter status Capped and Clamped

## 2019-08-11 NOTE — Progress Notes (Signed)
Pre HD Tx  08/11/19 [id number]  Hand-Off documentation  Report given to (Full Name) Beatris Ship, RN   Report received from (Full Name) Britt Bolognese, RN   Vital Signs  Temp 98.4 F (36.9 C)  Temp Source Oral  Pulse Rate (!) 103  Pulse Rate Source Monitor  Resp (!) 25  BP (!) 156/116  BP Location Right Arm  BP Method Automatic  Patient Position (if appropriate) Lying  Oxygen Therapy  SpO2 96 %  O2 Device Nasal Cannula  O2 Flow Rate (L/min) 2 L/min  Pulse Oximetry Type Continuous  Pain Assessment  Pain Scale 0-10  Pain Score 0  Dialysis Weight  Weight 46.7 kg  Type of Weight Pre-Dialysis  Time-Out for Hemodialysis  What Procedure? HD   Pt Identifiers(min of two) First/Last Name;MRN/Account#  Correct Site? Yes  Correct Side? Yes  Correct Procedure? Yes  Consents Verified? Yes  Rad Studies Available? N/A  Safety Precautions Reviewed? Yes  Engineer, civil (consulting) Number 5  Station Number 3  UF/Alarm Test Passed  Conductivity: Meter 13.8  Conductivity: Machine  13.6  pH 7.2  Reverse Osmosis Main  Normal Saline Lot Number [id number]  Dialyzer Lot Number [id number]  Disposable Set Lot Number [id number]  Machine Temperature 98.6 F (37 C)  Musician and Audible Yes  Blood Lines Intact and Secured Yes  Pre Treatment Patient Checks  Vascular access used during treatment Catheter  HD catheter dressing before treatment WDL  Hepatitis B Surface Antigen Results Negative  Date Hepatitis B Surface Antigen Drawn 08/03/19  Hemodialysis Consent Verified Yes  Hemodialysis Standing Orders Initiated Yes  ECG (Telemetry) Monitor On Yes  Prime Ordered Normal Saline  Length of  DialysisTreatment -hour(s) 3.5 Hour(s)  Dialysis Treatment Comments na 140  Dialyzer Elisio 17H NR  Dialysate 2K  Dialysate Flow Ordered 600  Blood Flow Rate Ordered 400 mL/min  Ultrafiltration Goal 2 Liters  Pre Treatment Labs Renal panel;CBC  Dialysis Blood Pressure Support Ordered Normal Saline   Education / Care Plan  Dialysis Education Provided Yes  Documented Education in Care Plan Yes  Hemodialysis Catheter Right Internal jugular Double lumen Temporary (Non-Tunneled)  No Placement Date or Time found.   Placed prior to admission: Yes  Orientation: Right  Access Location: Internal jugular  Hemodialysis Catheter Type: Double lumen Temporary (Non-Tunneled)  Site Condition No complications  Blue Lumen Status Blood return noted  Red Lumen Status Blood return noted  Purple Lumen Status Capped (Central line);N/A  Dressing Status Clean;Dry;Intact

## 2019-08-11 NOTE — Progress Notes (Signed)
Pre HD Assessment    08/11/19 0915  Neurological  Level of Consciousness Alert  Orientation Level Oriented X4  Respiratory  Respiratory Pattern Regular;Unlabored  Chest Assessment Chest expansion symmetrical  Bilateral Breath Sounds Clear;Diminished  Cough None  Cardiac  Pulse Regular  Heart Sounds S1, S2  ECG Monitor Yes  Cardiac Rhythm ST  Ectopy Unifocal PVC's  Ectopy Frequency Occasional  Vascular  R Radial Pulse +2  L Radial Pulse +2  Edema Generalized  Psychosocial  Psychosocial (WDL) WDL

## 2019-08-11 NOTE — Plan of Care (Signed)
  Problem: Education: Goal: Knowledge of General Education information will improve Description Including pain rating scale, medication(s)/side effects and non-pharmacologic comfort measures Outcome: Progressing   Problem: Health Behavior/Discharge Planning: Goal: Ability to manage health-related needs will improve Outcome: Progressing   

## 2019-08-11 NOTE — Progress Notes (Signed)
HD Tx completed tolerated, shortend by Pt, MD aware.    08/11/19 1215  Vital Signs  Pulse Rate (!) 102  Pulse Rate Source Monitor  Resp 20  BP (!) 162/122  BP Location Right Arm  BP Method Automatic  Patient Position (if appropriate) Lying  Oxygen Therapy  O2 Device Nasal Cannula  O2 Flow Rate (L/min) 2 L/min  Pulse Oximetry Type Continuous  During Hemodialysis Assessment  Blood Flow Rate (mL/min) 400 mL/min  Arterial Pressure (mmHg) -160 mmHg  Venous Pressure (mmHg) 150 mmHg  Transmembrane Pressure (mmHg) 60 mmHg  Ultrafiltration Rate (mL/min) 600 mL/min  Dialysate Flow Rate (mL/min) 600 ml/min  Conductivity: Machine  14  HD Safety Checks Performed Yes  KECN 59.6 KECN  Dialysis Fluid Bolus Normal Saline  Bolus Amount (mL) 250 mL  Intra-Hemodialysis Comments Tx completed;Tolerated well (Pt request to end tx, MD aware, approved. )

## 2019-08-11 NOTE — Progress Notes (Addendum)
ANTICOAGULATION CONSULT NOTE  Pharmacy Consult for Heparin Indication: ACS/STEMI  Patient Measurements: Height: 5\' 1"  (154.9 cm) Weight: 102 lb 15.3 oz (46.7 kg) IBW/kg (Calculated) : 52.3 HEPARIN DW (KG): 46.2  Vital Signs: Temp: 98.4 F (36.9 C) (10/20 1314) Temp Source: Oral (10/20 1314) BP: 153/105 (10/20 1314) Pulse Rate: 97 (10/20 1314)  Labs: Recent Labs    08/08/19 2221 08/08/19 2355  08/09/19 0438 08/10/19 0454 08/11/19 0538 08/11/19 1309  HGB  --   --    < > 8.1* 8.5* 8.9*  --   HCT  --   --   --  25.3* 26.8* 27.9*  --   PLT  --   --   --  140* 161 186  --   APTT  --   --    < > 71* 92* 49* 155*  HEPARINUNFRC  --   --   --   --   --  0.20*  --   CREATININE  --   --   --  2.65* 3.85* 4.67*  --   TROPONINIHS 101* 127*  --   --   --   --   --    < > = values in this interval not displayed.   Estimated Creatinine Clearance: 13.2 mL/min (A) (by C-G formula based on SCr of 4.67 mg/dL (H)).  Medical History: Past Medical History:  Diagnosis Date  . ESRD (end stage renal disease) ([place])   . Hypertension   . PAD (peripheral artery disease) ([facility])    Required aortofemoral stent-which had closed and had to redo the procedure and ischemia of limb.  . Peripheral vascular disease ([place])   . Renal disorder   . Secondary hyperparathyroidism of renal origin [facility])    Medications:  Medications Prior to Admission  Medication Sig Dispense Refill Last Dose  . amLODipine (NORVASC) 10 MG tablet Take 1 tablet (10 mg total) by mouth daily at 8 pm. 30 tablet 2 unknown at unknown  . apixaban (ELIQUIS) 2.5 MG TABS tablet Take 1 tablet (2.5 mg total) by mouth 2 (two) times daily. 60 tablet 1 unknown at unknown  . aspirin EC 81 MG tablet Take 1 tablet (81 mg total) by mouth daily. 30 tablet 1 unknown at unknown  . atorvastatin (LIPITOR) 80 MG tablet Take 1 tablet (80 mg total) by mouth at bedtime. 30 tablet 1 unknown at unknown  . b complex-C-folic acid 1 MG capsule Take 1 capsule by  mouth daily after supper.    unknown at unknown  . carvedilol (COREG) 25 MG tablet Take 1 tablet (25 mg total) by mouth 2 (two) times daily with a meal. 60 tablet 1 unknown at unknown  . gabapentin (NEURONTIN) 100 MG capsule Take 1 capsule (100 mg total) by mouth 3 (three) times daily. 90 capsule 1 unknown at unknown  . hydrALAZINE (APRESOLINE) 25 MG tablet Take 1 tablet (25 mg total) by mouth every 8 (eight) hours. 90 tablet 1 unknown at unknown  . hydrOXYzine (ATARAX/VISTARIL) 25 MG tablet Take 25 mg by mouth 3 (three) times daily as needed for anxiety.   unknown at unknown  . loratadine (CLARITIN) 10 MG tablet Take 1 tablet (10 mg total) by mouth daily. 30 tablet 0 unknown at unknown  . multivitamin (RENA-VIT) TABS tablet Take 1 tablet by mouth daily. 30 each 1 unknown at unknown  . neomycin-bacitracin-polymyxin (NEOSPORIN) OINT Apply 1 application topically 2 (two) times daily. 56 g 1 unknown at unknown  . Nutritional Supplements (FEEDING  SUPPLEMENT, NEPRO CARB STEADY,) LIQD Take 237 mLs by mouth 2 (two) times daily between meals. 237 mL 30 unknown at unknown  . sevelamer carbonate (RENVELA) 800 MG tablet Take 2 tablets (1,600 mg total) by mouth 3 (three) times daily with meals. 180 tablet 1 unknown at unknown    Assessment: Pharmacy asked to initiate Heparin protocol for a suspected PE vs CVA s/p MI.  Pt has been on Apixaban w/ last dose being given on 10/13 at 2117.  H&H, PLT trending up: continue to monitor  Heparin Course: 10/14 am initiation: 1000 unit bolus, then 700 units/hr 10/14 1318: inc to 750 units/hr 10/14 2325 aPTT = 89, therapeutic 10/15 0422 aPTT = 77, therapeutic x 2.  CBC stable.  10/16 0312 aPTT = 66, therapeutic x 3.  CBC stable.  10/17 0347 aPTT = 72, therapeutic x 4.  CBC stable.  10/18 0438 aPTT = 71, therapeutic x 5.  CBC stable.  10/19 0454 aPTT = 92, therapeutic x 6.  CBC stable.  10/20 1309 aPTT = 155, supratherapeutic  Goal of Therapy:  aPTT 66-102  seconds Monitor platelets by anticoagulation protocol: Yes   Plan:  10/20 @ 1309 aPTT 155 seconds. Will hold infusion for 30 minutes, and lower rate from 950 units/hr to 850 units/hr Will recheck aPTT/HL @ 2200.  If they correlate, we will monitor HL going forward. HL and CBC w/ am labs  Gerald Dexter, PharmD 08/11/2019,2:14 PM

## 2019-08-11 NOTE — Progress Notes (Signed)
HD Tx started w/o issue    08/11/19 0930  Vital Signs  Pulse Rate (!) 108  Pulse Rate Source Monitor  Resp (!) 25  BP (!) 159/117  BP Location Right Arm  BP Method Automatic  Patient Position (if appropriate) Lying  Oxygen Therapy  SpO2 93 %  During Hemodialysis Assessment  Blood Flow Rate (mL/min) 400 mL/min  Arterial Pressure (mmHg) -110 mmHg  Venous Pressure (mmHg) 80 mmHg  Transmembrane Pressure (mmHg) 60 mmHg  Ultrafiltration Rate (mL/min) 600 mL/min  Dialysate Flow Rate (mL/min) 600 ml/min  Conductivity: Machine  13.8  HD Safety Checks Performed Yes  Dialysis Fluid Bolus Normal Saline  Bolus Amount (mL) 250 mL  Intra-Hemodialysis Comments Tx initiated

## 2019-08-11 NOTE — Progress Notes (Signed)
Patient ID: Emmery Lavan, male   DOB: October 02, 1976, 43 y.o.   MRN: [id number]  [employer] PROGRESS NOTE  Todrick Olshefski [id number] DOB: 04/28/1976 DOA: 08/03/2019 PCP: Patient, [name]  HPI/Subjective: Patient and examined during dialysis center.  Feeling much better Patient was extubated 08/07/2019.     Objective: Vitals:   08/11/19 1200 08/11/19 1314  BP: (!) 162/113 (!) 153/105  Pulse: 95 97  Resp:  20  Temp:  98.4 F (36.9 C)  SpO2:  93%    Intake/Output Summary (Last 24 hours) at 08/11/2019 1446 Last data filed at 08/11/2019 0321 Gross per 24 hour  Intake 240 ml  Output 100 ml  Net 140 ml   Filed Weights   08/10/19 0102 08/11/19 0311 08/11/19 0929  Weight: 45.7 kg 46.7 kg 46.7 kg    ROS: Review of Systems  Constitutional: Negative for chills, diaphoresis and fever.  HENT: Negative for ear discharge, hearing loss and nosebleeds.   Eyes: Negative for blurred vision and pain.  Respiratory: Negative for cough, hemoptysis and shortness of breath.   Cardiovascular: Negative for palpitations and orthopnea.  Gastrointestinal: Negative for abdominal pain, diarrhea, nausea and vomiting.  Skin: Negative for itching and rash.  Neurological: Negative for dizziness, tingling, speech change and weakness.  Psychiatric/Behavioral: Negative for depression. The patient is not nervous/anxious.    Exam: Physical Exam  HENT:  Mouth/Throat: No oropharyngeal exudate.  Unable to look on the mouth  Eyes: Pupils are equal, round, and reactive to light. Conjunctivae and lids are normal.  Neck: Trachea normal. Carotid bruit is not present.  Cardiovascular: Regular rhythm, S1 normal, S2 normal and normal heart sounds.  Respiratory: He has decreased breath sounds in the right lower field and the left lower field. He has no wheezes. He has rhonchi in the right lower field and the left lower field. He has no rales.  GI: Soft. Bowel sounds are normal. There is no abdominal  tenderness.  Musculoskeletal:     Right ankle: He exhibits no swelling.     Left ankle: He exhibits no swelling.  Neurological: He is alert.  Follows commands and able to move all extremities.  Skin: Skin is warm. No rash noted. Nails show no clubbing.  Psychiatric: He has a normal mood and affect.      Data Reviewed: Basic Metabolic Panel: Recent Labs  Lab 08/06/19 1124 08/06/19 1958 08/07/19 0312 08/08/19 0347 08/09/19 0438 08/10/19 0454 08/11/19 0538  NA  --  135 137 140 139 137 137  K  --  3.6 3.8 4.7 3.4* 3.7 3.5  CL  --  99 102 104 100 99 101  CO2  --  26 27 25 27 24 23   GLUCOSE  --  138* 129* 132* 127* 138* 115*  BUN  --  17 22* 33* 20 35* 46*  CREATININE  --  2.16* 2.57* 4.01* 2.65* 3.85* 4.67*  CALCIUM  --  8.2* 8.4* 8.9 8.6* 8.5* 8.6*  MG  --  1.6* 1.7 2.8* 1.9  --  2.1  PHOS 9.1*  --   --   --  3.8  --  4.4   Liver Function Tests: Recent Labs  Lab 08/05/19 0431 08/06/19 0422 08/11/19 0538  AST 117* 79*  --   ALT 67* 66*  --   ALKPHOS 94 75  --   BILITOT 1.2 1.7*  --   PROT 6.2* 5.8*  --   ALBUMIN 2.6* 2.4* 2.9*   CBC: Recent Labs  Lab  08/05/19 0431 08/05/19 1132 08/05/19 1819  08/06/19 1958 08/07/19 0312 08/08/19 0347 08/09/19 0438 08/10/19 0454 08/11/19 0538  WBC 12.3* 12.1* 8.0   < > 8.2 7.4 11.2* 11.7* 9.5 11.0*  NEUTROABS 10.7* 9.9* 7.1  --  7.0  --   --   --   --   --   HGB 8.2* 8.2* 8.3*   < > 7.4* 7.7* 7.8* 8.1* 8.5* 8.9*  HCT 26.8* 26.3* 26.3*   < > 22.8* 23.5* 25.3* 25.3* 26.8* 27.9*  MCV 104.3* 102.7* 102.3*   < > 97.9 97.1 102.8* 101.2* 100.4* 101.1*  PLT 135* 134* 122*   < > 124* 122* 131* 140* 161 186   < > = values in this interval not displayed.   BNP (last 3 results) Recent Labs    07/20/19 1029 08/03/19 0925 08/05/19 0431  BNP >4,500.0* >4,500.0* 3,772.0*     CBG: Recent Labs  Lab 08/10/19 1652 08/10/19 2148 08/10/19 2354 08/11/19 0421 08/11/19 0715  GLUCAP 88 109* 93 118* 96    Recent Results (from  the past 240 hour(s))  SARS Coronavirus 2 by RT PCR (hospital order, performed in [facility] hospital lab) Nasopharyngeal Nasopharyngeal Swab     Status: None   Collection Time: 08/03/19  9:25 AM   Specimen: Nasopharyngeal Swab  Result Value Ref Range Status   SARS Coronavirus 2 NEGATIVE NEGATIVE Final    Comment: (NOTE) If result is NEGATIVE SARS-CoV-2 target nucleic acids are NOT DETECTED. The SARS-CoV-2 RNA is generally detectable in upper and lower  respiratory specimens during the acute phase of infection. The lowest  concentration of SARS-CoV-2 viral copies this assay can detect is 250  copies / mL. A negative result does not preclude SARS-CoV-2 infection  and should not be used as the sole basis for treatment or other  patient management decisions.  A negative result may occur with  improper specimen collection / handling, submission of specimen other  than nasopharyngeal swab, presence of viral mutation(s) within the  areas targeted by this assay, and inadequate number of viral copies  (<250 copies / mL). A negative result must be combined with clinical  observations, patient history, and epidemiological information. If result is POSITIVE SARS-CoV-2 target nucleic acids are DETECTED. The SARS-CoV-2 RNA is generally detectable in upper and lower  respiratory specimens dur ing the acute phase of infection.  Positive  results are indicative of active infection with SARS-CoV-2.  Clinical  correlation with patient history and other diagnostic information is  necessary to determine patient infection status.  Positive results do  not rule out bacterial infection or co-infection with other viruses. If result is PRESUMPTIVE POSTIVE SARS-CoV-2 nucleic acids MAY BE PRESENT.   A presumptive positive result was obtained on the submitted specimen  and confirmed on repeat testing.  While 2019 novel coronavirus  (SARS-CoV-2) nucleic acids may be present in the submitted sample  additional  confirmatory testing may be necessary for epidemiological  and / or clinical management purposes  to differentiate between  SARS-CoV-2 and other Sarbecovirus currently known to infect humans.  If clinically indicated additional testing with an alternate test  methodology [phone]) is advised. The SARS-CoV-2 RNA is generally  detectable in upper and lower respiratory sp ecimens during the acute  phase of infection. The expected result is Negative. Fact Sheet for Patients:  StrictlyIdeas.no Fact Sheet for Healthcare Providers: BankingDealers.co.za This test is not yet approved or cleared by the [place] FDA and has been authorized for detection  and/or diagnosis of SARS-CoV-2 by FDA under an Emergency Use Authorization (EUA).  This EUA will remain in effect (meaning this test can be used) for the duration of the COVID-19 declaration under Section 564(b)(1) of the Act, 21 U.S.C. section 360bbb-3(b)(1), unless the authorization is terminated or revoked sooner. Performed at [facility], [address] [place], [place] [address]   Blood culture (routine x 2)     Status: None   Collection Time: 08/03/19 10:05 AM   Specimen: BLOOD  Result Value Ref Range Status   Specimen Description BLOOD BLOOD RIGHT FOREARM  Final   Special Requests   Final    BOTTLES DRAWN AEROBIC AND ANAEROBIC Blood Culture adequate volume   Culture   Final    NO GROWTH 5 DAYS Performed at [facility], [address] [place], [place] [address]    Report Status 08/08/2019 FINAL  Final  MRSA PCR Screening     Status: None   Collection Time: 08/04/19  7:56 AM   Specimen: Nasopharyngeal  Result Value Ref Range Status   MRSA by PCR NEGATIVE NEGATIVE Final    Comment:        The GeneXpert MRSA Assay (FDA approved for NASAL specimens only), is one component of a comprehensive MRSA colonization surveillance program. It is not intended to  diagnose MRSA infection nor to guide or monitor treatment for MRSA infections. Performed at [facility], [address] [place], [place] [address]   Urine Culture     Status: None   Collection Time: 08/05/19 10:14 AM   Specimen: Urine, Random  Result Value Ref Range Status   Specimen Description   Final    URINE, RANDOM Performed at [facility], [address], [place], [place] [address]    Special Requests   Final    NONE Performed at [facility], [address] [place], [place] [address]    Culture   Final    NO GROWTH Performed at [facility], [place] [address] [place], [place] [address]    Report Status 08/06/2019 FINAL  Final  Culture, respiratory (non-expectorated)     Status: None   Collection Time: 08/05/19  8:40 PM   Specimen: Tracheal Aspirate; Respiratory  Result Value Ref Range Status   Specimen Description   Final    TRACHEAL ASPIRATE Performed at [facility], [address] [place], [place] [address]    Special Requests   Final    Normal Performed at [facility], [address] [place], [place] [address]    Gram Stain   Final    FEW WBC PRESENT,BOTH PMN AND MONONUCLEAR NO ORGANISMS SEEN    Culture   Final    RARE Consistent with normal respiratory flora. Performed at [facility], [place] [address], [place],  [address]    Report Status 08/08/2019 FINAL  Final     Studies: Nm Pulmonary Perf And Vent  Result Date: 08/10/2019 CLINICAL DATA:  Shortness of breath EXAM: NUCLEAR MEDICINE VENTILATION - PERFUSION LUNG SCAN Views: Anterior, posterior, left lateral, right lateral, RPO, LPO, RAO, LAO-ventilation perfusion RADIOPHARMACEUTICALS:  9.9 mCi of Tc-63m DTPA aerosol inhalation and 4.34 mCi Tc92m MAA IV COMPARISON:  Chest radiograph August 10, 2019 FINDINGS: Ventilation: No focal ventilation defects are appreciable. There is cardiomegaly which causes a degree of volume loss in the  left lower lobe region. Perfusion: No focal perfusion defects are evident. Note that there is cardiomegaly. IMPRESSION: No appreciable  ventilation or perfusion defects. Very low probability of pulmonary embolus. Cardiomegaly noted. Electronically Signed   By: Lowella Grip III M.D.   On: 08/10/2019 13:07   Dg Chest Port 1 View  Result Date: 08/10/2019 CLINICAL DATA:  Pt has a cough. Hx of hypertension, ESRD, perm cath. Current everyday smoker EXAM: PORTABLE CHEST 1 VIEW COMPARISON:  Radiograph 08/06/2011 FINDINGS: Large-bore central venous line unchanged. Interval extubation. Removal of NG tube. Stable enlarged cardiac silhouette. There is a moderate LEFT effusion similar prior. Central venous congestion slightly increased. Vascular stent noted in the LEFT axilla IMPRESSION: 1. Extubation without complication. 2. Increase in central venous congestion. 3. Stable LEFT pleural effusion. Electronically Signed   By: Suzy Bouchard M.D.   On: 08/10/2019 11:57    Scheduled Meds: . aspirin  81 mg Per Tube Daily  . Chlorhexidine Gluconate Cloth  6 each Topical Q0600  . epoetin (EPOGEN/PROCRIT) injection  10,000 Units Intravenous Q T,Th,Sa-HD  . feeding supplement (NEPRO CARB STEADY)  237 mL Oral BID BM  . hydrocortisone sod succinate (SOLU-CORTEF) inj  100 mg Intravenous Q12H  . insulin aspart  0-9 Units Subcutaneous Q4H  . multivitamin  1 tablet Oral QHS  . pantoprazole  20 mg Oral Daily  . pantoprazole (PROTONIX) IV  40 mg Intravenous QHS  . potassium chloride  40 mEq Oral Once   Continuous Infusions: . [START ON 08/12/2019]  ceFAZolin (ANCEF) IV    . heparin    . levETIRAcetam 500 mg (08/10/19 2252)    Assessment/Plan:  1. PEA arrest.  Patient had return of spontaneous circulation following ACLS.   VQ scan ruled out pulmonary embolism, is probably from hypotension following hemodialysis. patient is a DNR.  2. Acute hypoxic and hypercapnic respiratory failure.  Patient extubated  08/07/2019 and placed on nasal cannula..  On cefepime.  VQ scan negative for pulmonary embolism 3. Acute on chronic systolic congestive heart failure with EF less than 20.  Dialysis to manage fluid. 4. End-stage renal disease on hemodialysis.  Tuesday Thursday Saturday schedule.  Hemodialysis today 5. Possible seizure on Keppra.  Continue Keppra 500 twice daily.  EEG with diffuse slowing without any sharps.  Neurology recommended reimaging of the brain in a few days.  Patient following commands. 6. 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  Plans to do hero graft on the right side on Wednesday followed by angio for  the left on Thursday.  N.p.o. after midnight 7. History of PAD- vascular surgery follow-up Plan of care discussed with the patient and intensivist Code Status:     Code Status Orders  (From admission, onward)         Start     Ordered   08/05/19 0653  Do not attempt resuscitation (DNR)  Continuous    Question Answer Comment  In the event of cardiac or respiratory ARREST Do not call a "code blue"   In the event of cardiac or respiratory ARREST Do not perform Intubation, CPR, defibrillation or ACLS   In the event of cardiac or respiratory ARREST Use medication by any route, position, wound care, and other measures to relive pain and suffering. May use oxygen, suction and manual treatment of airway obstruction as needed for comfort.      08/05/19 [id number]        Code Status History    Date Active Date Inactive Code Status Order ID Comments User Context   08/05/2019 0613 08/05/2019 0652 Full Code [id number]  [name], NP Inpatient  08/04/2019 0127 08/05/2019 0613 Full Code [id number]  Christel Mormon, MD ED   07/27/2019 1110 07/29/2019 1719 Full Code [id number]  Hillary Bow, MD ED   07/20/2019 2041 07/23/2019 1929 DNR [id number]  Vaughan Basta, MD Inpatient   07/09/2019 0543 07/12/2019 1502 Full Code [id number]  Harrie Foreman, MD Inpatient   07/02/2019 2128  07/06/2019 1557 Full Code [id number]  Henreitta Leber, MD Inpatient   05/19/2019 0603 05/22/2019 1957 Full Code [id number]  Lance Coon, MD Inpatient   02/28/2019 1805 03/03/2019 2234 Full Code [id number]  Vaughan Basta, MD Inpatient   02/21/2019 0835 02/24/2019 1945 Full Code [id number]  Harrie Foreman, MD Inpatient   02/04/2019 1343 02/09/2019 2050 Full Code [id number]  Saundra Shelling, MD ED   01/27/2019 0654 01/29/2019 2049 Partial Code [id number]  Harrie Foreman, MD Inpatient   01/07/2019 2156 01/27/2019 0638 Partial Code [id number]  Tennis Ship, MD Inpatient   12/25/2018 0158 01/07/2019 2027 Partial Code [id number]  Ina Homes, MD ED   Advance Care Planning Activity     Family Communication: As per critical care specialist Disposition Plan: To be determined  Consultants:  Critical care specialist  Cardiology  Nephrology  Antibiotics:  Cefepime  Time spent: 33 minutes.  Case discussed with respiratory, critical care specialist and nursing staff.  Nicholes Mango  [employer]

## 2019-08-12 ENCOUNTER — Other Ambulatory Visit (INDEPENDENT_AMBULATORY_CARE_PROVIDER_SITE_OTHER): Payer: Self-pay | Admitting: Vascular Surgery

## 2019-08-12 ENCOUNTER — Inpatient Hospital Stay: Payer: Medicare Other | Admitting: Anesthesiology

## 2019-08-12 ENCOUNTER — Encounter
Admission: EM | Disposition: A | Discharge status: Home Health | Payer: Self-pay | Source: Home / Self Care | Attending: Internal Medicine

## 2019-08-12 ENCOUNTER — Inpatient Hospital Stay: Payer: Medicare Other

## 2019-08-12 DIAGNOSIS — T82898A Other specified complication of vascular prosthetic devices, implants and grafts, initial encounter: Secondary | ICD-10-CM

## 2019-08-12 DIAGNOSIS — N186 End stage renal disease: Secondary | ICD-10-CM

## 2019-08-12 DIAGNOSIS — Z992 Dependence on renal dialysis: Secondary | ICD-10-CM

## 2019-08-12 HISTORY — PX: AV FISTULA PLACEMENT: SHX1204

## 2019-08-12 LAB — GLUCOSE, CAPILLARY
Glucose-Capillary: 103 mg/dL — ABNORMAL HIGH (ref 70–99)
Glucose-Capillary: 119 mg/dL — ABNORMAL HIGH (ref 70–99)
Glucose-Capillary: 121 mg/dL — ABNORMAL HIGH (ref 70–99)
Glucose-Capillary: 131 mg/dL — ABNORMAL HIGH (ref 70–99)
Glucose-Capillary: 169 mg/dL — ABNORMAL HIGH (ref 70–99)
Glucose-Capillary: 73 mg/dL (ref 70–99)

## 2019-08-12 LAB — CBC
HCT: 28.9 % — ABNORMAL LOW (ref 39.0–52.0)
Hemoglobin: 9.1 g/dL — ABNORMAL LOW (ref 13.0–17.0)
MCH: 31.9 pg (ref 26.0–34.0)
MCHC: 31.5 g/dL (ref 30.0–36.0)
MCV: 101.4 fL — ABNORMAL HIGH (ref 80.0–100.0)
Platelets: 205 10*3/uL (ref 150–400)
RBC: 2.85 MIL/uL — ABNORMAL LOW (ref 4.22–5.81)
RDW: 18.4 % — ABNORMAL HIGH (ref 11.5–15.5)
WBC: 8.3 10*3/uL (ref 4.0–10.5)
nRBC: 1.5 % — ABNORMAL HIGH (ref 0.0–0.2)

## 2019-08-12 LAB — BASIC METABOLIC PANEL
Anion gap: 14 (ref 5–15)
BUN: 29 mg/dL — ABNORMAL HIGH (ref 6–20)
CO2: 25 mmol/L (ref 22–32)
Calcium: 8.6 mg/dL — ABNORMAL LOW (ref 8.9–10.3)
Chloride: 102 mmol/L (ref 98–111)
Creatinine, Ser: 3.44 mg/dL — ABNORMAL HIGH (ref 0.61–1.24)
GFR calc Af Amer: 24 mL/min — ABNORMAL LOW (ref >60)
GFR calc non Af Amer: 20 mL/min — ABNORMAL LOW (ref >60)
Glucose, Bld: 132 mg/dL — ABNORMAL HIGH (ref 70–99)
Potassium: 3.7 mmol/L (ref 3.5–5.1)
Sodium: 141 mmol/L (ref 135–145)

## 2019-08-12 LAB — PROTIME-INR
INR: 1.5 — ABNORMAL HIGH (ref 0.8–1.2)
Prothrombin Time: 17.6 seconds — ABNORMAL HIGH (ref 11.4–15.2)

## 2019-08-12 LAB — MAGNESIUM: Magnesium: 1.9 mg/dL (ref 1.7–2.4)

## 2019-08-12 LAB — APTT: aPTT: 77 seconds — ABNORMAL HIGH (ref 24–36)

## 2019-08-12 LAB — HEPARIN LEVEL (UNFRACTIONATED): Heparin Unfractionated: 0.28 IU/mL — ABNORMAL LOW (ref 0.30–0.70)

## 2019-08-12 SURGERY — INSERTION OF ARTERIOVENOUS (AV) GORE-TEX GRAFT ARM
Anesthesia: General | Laterality: Right

## 2019-08-12 MED ORDER — FENTANYL CITRATE (PF) 100 MCG/2ML IJ SOLN
INTRAMUSCULAR | Status: DC | PRN
  Administered 2019-08-12 (×2): 50 ug via INTRAVENOUS

## 2019-08-12 MED ORDER — HEMOSTATIC AGENTS (NO CHARGE) OPTIME
TOPICAL | Status: DC | PRN
  Administered 2019-08-12: 1 via TOPICAL

## 2019-08-12 MED ORDER — PROPOFOL 500 MG/50ML IV EMUL
INTRAVENOUS | Status: AC
  Filled 2019-08-12: qty 50

## 2019-08-12 MED ORDER — EVICEL 2 ML EX KIT
PACK | CUTANEOUS | Status: DC | PRN
  Administered 2019-08-12: 2 mL

## 2019-08-12 MED ORDER — HEPARIN SODIUM (PORCINE) 1000 UNIT/ML IJ SOLN
INTRAMUSCULAR | Status: DC | PRN
  Administered 2019-08-12: 3000 [IU] via INTRAVENOUS

## 2019-08-12 MED ORDER — SODIUM CHLORIDE 0.9% FLUSH
10.0000 mL | INTRAVENOUS | Status: DC | PRN
  Administered 2019-08-14: 10 mL
  Filled 2019-08-12: qty 40

## 2019-08-12 MED ORDER — LIDOCAINE HCL (CARDIAC) PF 100 MG/5ML IV SOSY
PREFILLED_SYRINGE | INTRAVENOUS | Status: DC | PRN
  Administered 2019-08-12: 60 mg via INTRAVENOUS

## 2019-08-12 MED ORDER — PROPOFOL 10 MG/ML IV BOLUS
INTRAVENOUS | Status: DC | PRN
  Administered 2019-08-12: 30 mg via INTRAVENOUS
  Administered 2019-08-12: 120 mg via INTRAVENOUS

## 2019-08-12 MED ORDER — MIDAZOLAM HCL 2 MG/2ML IJ SOLN
INTRAMUSCULAR | Status: DC | PRN
  Administered 2019-08-12: 2 mg via INTRAVENOUS

## 2019-08-12 MED ORDER — CEFAZOLIN SODIUM-DEXTROSE 1-4 GM/50ML-% IV SOLN
INTRAVENOUS | Status: AC
  Filled 2019-08-12: qty 50

## 2019-08-12 MED ORDER — MIDAZOLAM HCL 2 MG/2ML IJ SOLN
INTRAMUSCULAR | Status: AC
  Filled 2019-08-12: qty 2

## 2019-08-12 MED ORDER — CEFAZOLIN SODIUM-DEXTROSE 1-4 GM/50ML-% IV SOLN
1.0000 g | Freq: Once | INTRAVENOUS | Status: AC
  Administered 2019-08-12: 1 g via INTRAVENOUS
  Filled 2019-08-12: qty 50

## 2019-08-12 MED ORDER — LABETALOL HCL 5 MG/ML IV SOLN
20.0000 mg | Freq: Once | INTRAVENOUS | Status: AC
  Administered 2019-08-12: 15:00:00 20 mg via INTRAVENOUS
  Filled 2019-08-12: qty 4

## 2019-08-12 MED ORDER — LABETALOL HCL 5 MG/ML IV SOLN
INTRAVENOUS | Status: AC
  Filled 2019-08-12: qty 4

## 2019-08-12 MED ORDER — DEXAMETHASONE SODIUM PHOSPHATE 10 MG/ML IJ SOLN
INTRAMUSCULAR | Status: DC | PRN
  Administered 2019-08-12: 4 mg via INTRAVENOUS

## 2019-08-12 MED ORDER — HEPARIN (PORCINE) 25000 UT/250ML-% IV SOLN
900.0000 [IU]/h | INTRAVENOUS | Status: AC
  Administered 2019-08-12: 01:00:00 900 [IU]/h via INTRAVENOUS
  Filled 2019-08-12: qty 250

## 2019-08-12 MED ORDER — SODIUM CHLORIDE 0.9% FLUSH
10.0000 mL | Freq: Two times a day (BID) | INTRAVENOUS | Status: DC
  Administered 2019-08-12 – 2019-08-16 (×6): 10 mL

## 2019-08-12 MED ORDER — FENTANYL CITRATE (PF) 100 MCG/2ML IJ SOLN
INTRAMUSCULAR | Status: AC
  Filled 2019-08-12: qty 2

## 2019-08-12 MED ORDER — MAGNESIUM SULFATE 2 GM/50ML IV SOLN
2.0000 g | Freq: Once | INTRAVENOUS | Status: AC
  Administered 2019-08-12: 2 g via INTRAVENOUS
  Filled 2019-08-12: qty 50

## 2019-08-12 MED ORDER — POTASSIUM CHLORIDE 20 MEQ PO PACK
40.0000 meq | PACK | Freq: Once | ORAL | Status: AC
  Administered 2019-08-12: 18:00:00 40 meq via ORAL
  Filled 2019-08-12: qty 2

## 2019-08-12 MED ORDER — PROMETHAZINE HCL 25 MG/ML IJ SOLN: 6.2500 mg | INTRAMUSCULAR | Status: DC | PRN

## 2019-08-12 MED ORDER — PHENYLEPHRINE HCL (PRESSORS) 10 MG/ML IV SOLN
INTRAVENOUS | Status: DC | PRN
  Administered 2019-08-12: 100 ug via INTRAVENOUS

## 2019-08-12 MED ORDER — SODIUM CHLORIDE 0.9 % IV SOLN
INTRAVENOUS | Status: DC | PRN
  Administered 2019-08-12: 100 mL via INTRAMUSCULAR

## 2019-08-12 MED ORDER — FENTANYL CITRATE (PF) 100 MCG/2ML IJ SOLN: 25.0000 ug | INTRAMUSCULAR | Status: DC | PRN

## 2019-08-12 MED ORDER — SODIUM CHLORIDE 0.9 % IV SOLN
INTRAVENOUS | Status: DC | PRN
  Administered 2019-08-12 – 2019-08-15 (×5): 250 mL via INTRAVENOUS

## 2019-08-12 MED ORDER — SODIUM CHLORIDE 0.9 % IV SOLN
INTRAVENOUS | Status: DC | PRN
  Administered 2019-08-12: 11:00:00 via INTRAVENOUS

## 2019-08-12 MED ORDER — HEPARIN SODIUM (PORCINE) 5000 UNIT/ML IJ SOLN
INTRAMUSCULAR | Status: AC
  Filled 2019-08-12: qty 1

## 2019-08-12 MED ORDER — EVICEL 2 ML EX KIT
PACK | CUTANEOUS | Status: AC
  Filled 2019-08-12: qty 1

## 2019-08-12 SURGICAL SUPPLY — 75 items
BAG DECANTER FOR FLEXI CONT (MISCELLANEOUS) ×4 IMPLANT
BLADE SURG 15 STRL LF DISP TIS (BLADE) ×2 IMPLANT
BLADE SURG 15 STRL SS (BLADE) ×2
BLADE SURG SZ11 CARB STEEL (BLADE) ×4 IMPLANT
BOOT SUTURE AID YELLOW STND (SUTURE) ×4 IMPLANT
BRUSH SCRUB EZ  4% CHG (MISCELLANEOUS) ×2
BRUSH SCRUB EZ 4% CHG (MISCELLANEOUS) ×2 IMPLANT
CANISTER SUCT 1200ML W/VALVE (MISCELLANEOUS) ×4 IMPLANT
CHLORAPREP W/TINT 26 (MISCELLANEOUS) ×8 IMPLANT
CLIP SPRNG 6 S-JAW DBL (CLIP) ×2 IMPLANT
CLIP SPRNG 6MM S-JAW DBL (CLIP) ×4
COMPONENT HERO ACCESSORY KIT (VASCULAR PRODUCTS) ×2 IMPLANT
COVER PROBE FLX POLY STRL (MISCELLANEOUS) ×2 IMPLANT
COVER WAND RF STERILE (DRAPES) ×4 IMPLANT
DERMABOND ADVANCED (GAUZE/BANDAGES/DRESSINGS) ×2
DERMABOND ADVANCED .7 DNX12 (GAUZE/BANDAGES/DRESSINGS) ×2 IMPLANT
DRAPE 3/4 80X56 (DRAPES) ×4 IMPLANT
DRAPE C-ARM XRAY 36X54 (DRAPES) ×2 IMPLANT
DRAPE IMP U-DRAPE 54X76 (DRAPES) ×2 IMPLANT
DRAPE INCISE IOBAN 66X45 STRL (DRAPES) ×4 IMPLANT
DRAPE LAPAROTOMY 77X122 PED (DRAPES) ×2 IMPLANT
ELECT CAUTERY BLADE 6.4 (BLADE) ×4 IMPLANT
ELECT REM PT RETURN 9FT ADLT (ELECTROSURGICAL) ×4
ELECTRODE REM PT RTRN 9FT ADLT (ELECTROSURGICAL) ×2 IMPLANT
GEL ULTRASOUND 20GR AQUASONIC (MISCELLANEOUS) IMPLANT
GLOVE BIO SURGEON STRL SZ7 (GLOVE) ×14 IMPLANT
GLOVE INDICATOR 7.5 STRL GRN (GLOVE) ×10 IMPLANT
GLOVE SURG SYN 8.0 (GLOVE) IMPLANT
GLOVE SURG SYN 8.0 PF PI (GLOVE) ×2 IMPLANT
GOWN STRL REUS W/ TWL LRG LVL3 (GOWN DISPOSABLE) ×6 IMPLANT
GOWN STRL REUS W/ TWL XL LVL3 (GOWN DISPOSABLE) ×2 IMPLANT
GOWN STRL REUS W/TWL LRG LVL3 (GOWN DISPOSABLE) ×8
GOWN STRL REUS W/TWL XL LVL3 (GOWN DISPOSABLE) ×2
GRAFT PROPATEN STD WALL 6X40 (Vascular Products) ×2 IMPLANT
HEMOSTAT SURGICEL 2X3 (HEMOSTASIS) ×4 IMPLANT
IV NS 500ML (IV SOLUTION) ×2
IV NS 500ML BAXH (IV SOLUTION) ×2 IMPLANT
KIT TURNOVER KIT A (KITS) ×4 IMPLANT
LABEL OR SOLS (LABEL) ×4 IMPLANT
LOOP RED MAXI  1X406MM (MISCELLANEOUS) ×4
LOOP VESSEL MAXI 1X406 RED (MISCELLANEOUS) ×4 IMPLANT
LOOP VESSEL MINI 0.8X406 BLUE (MISCELLANEOUS) ×2 IMPLANT
LOOPS BLUE MINI 0.8X406MM (MISCELLANEOUS) ×2
NDL FILTER BLUNT 18X1 1/2 (NEEDLE) ×2 IMPLANT
NDL HYPO 25X1 1.5 SAFETY (NEEDLE) IMPLANT
NEEDLE FILTER BLUNT 18X 1/2SAF (NEEDLE) ×2
NEEDLE FILTER BLUNT 18X1 1/2 (NEEDLE) ×2 IMPLANT
NEEDLE HYPO 25X1 1.5 SAFETY (NEEDLE) IMPLANT
NS IRRIG 500ML POUR BTL (IV SOLUTION) ×2 IMPLANT
PACK BASIN MAJOR ARMC (MISCELLANEOUS) ×4 IMPLANT
PACK BASIN MINOR ARMC (MISCELLANEOUS) ×2 IMPLANT
PACK EXTREMITY ARMC (MISCELLANEOUS) ×4 IMPLANT
PACK UNIVERSAL (MISCELLANEOUS) ×4 IMPLANT
PAD PREP 24X41 OB/GYN DISP (PERSONAL CARE ITEMS) ×4 IMPLANT
SOLUTION CELL SAVER (CLIP) ×2 IMPLANT
STOCKINETTE 48X4 2 PLY STRL (GAUZE/BANDAGES/DRESSINGS) ×2 IMPLANT
STOCKINETTE STRL 4IN 9604848 (GAUZE/BANDAGES/DRESSINGS) ×4 IMPLANT
SUT GTX CV-6 30 (SUTURE) ×8 IMPLANT
SUT MNCRL AB 4-0 PS2 18 (SUTURE) ×4 IMPLANT
SUT MNCRL+ 5-0 UNDYED PC-3 (SUTURE) ×2 IMPLANT
SUT MONOCRYL 5-0 (SUTURE)
SUT PROLENE 6 0 BV (SUTURE) ×12 IMPLANT
SUT SILK 2 0 (SUTURE) ×2
SUT SILK 2 0 SH (SUTURE) ×4 IMPLANT
SUT SILK 2-0 18XBRD TIE 12 (SUTURE) ×2 IMPLANT
SUT SILK 3 0 (SUTURE) ×2
SUT SILK 3-0 18XBRD TIE 12 (SUTURE) ×2 IMPLANT
SUT SILK 4 0 (SUTURE) ×2
SUT SILK 4-0 18XBRD TIE 12 (SUTURE) ×2 IMPLANT
SUT VIC AB 3-0 SH 27 (SUTURE) ×4
SUT VIC AB 3-0 SH 27X BRD (SUTURE) ×4 IMPLANT
SYR 10ML LL (SYRINGE) IMPLANT
SYR 20ML LL LF (SYRINGE) ×4 IMPLANT
SYR 3ML LL SCALE MARK (SYRINGE) ×4 IMPLANT
SYR TB 1ML 27GX1/2 LL (SYRINGE) IMPLANT

## 2019-08-12 NOTE — Anesthesia Procedure Notes (Signed)
Procedure Name: LMA Insertion Date/Time: 08/12/2019 11:41 AM Performed by: Jonna Clark, CRNA Pre-anesthesia Checklist: Patient identified, Patient being monitored, Timeout performed, Emergency Drugs available and Suction available Patient Re-evaluated:Patient Re-evaluated prior to induction Oxygen Delivery Method: Circle system utilized Preoxygenation: Pre-oxygenation with 100% oxygen Induction Type: IV induction Ventilation: Mask ventilation without difficulty LMA: LMA inserted LMA Size: 3.5 Tube type: Oral Number of attempts: 1 Placement Confirmation: positive ETCO2 and breath sounds checked- equal and bilateral Tube secured with: Tape Dental Injury: Teeth and Oropharynx as per pre-operative assessment

## 2019-08-12 NOTE — Op Note (Signed)
[facility]  OPERATIVE NOTE   PROCEDURE:  Right upper arm brachial artery to axillary vein arteriovenous graft with 6 mm Propaten pTFE graft  PRE-OPERATIVE DIAGNOSIS: 1. end stage renal disease  2. Failed left arm access  POST-OPERATIVE DIAGNOSIS: same  SURGEON: Leotis Pain  ASSISTANT(S): Hezzie Bump, PA-C  ANESTHESIA: general  ESTIMATED BLOOD LOSS: 15 cc  FINDING(S): 1. none  SPECIMEN(S):  None  INDICATIONS:   Brett Small is a 43 y.o. male who presents with end stage renal disease and failure of his left arm access.  Risk, benefits, and alternatives to access surgery were discussed.  The patient is aware the risks include but are not limited to: bleeding, infection, steal syndrome, nerve damage, ischemic monomelic neuropathy, failure to mature, and need for additional procedures.  The patient is aware of the risks and elects to proceed forward.  An assistant was present during the procedure to help facilitate the exposure and expedite the procedure.  DESCRIPTION: After full informed written consent was obtained from the patient, the patient was brought back to the operating room and placed supine upon the operating table.  The patient was given IV antibiotics prior to proceeding.  After obtaining adequate sedation, the patient was prepped and draped in standard fashion for a right arm access procedure. The assistant provided retraction and mobilization to help facilitate exposure and expedite the procedure throughout the entire procedure.  This included following suture, using retractors, and optimizing lighting. I turned my attention first to the antecubitum.   I made an incision over the brachial artery, and dissected down through the subcutaneous tissue to the fascia carefully and was able to dissect out the brachial artery.  The artery was about patent and of adequate size to support a graft.  It was controlled proximally and distally with vessel loops  and then I turned my attention to the high bicipital groove in the axilla.  I made an incision and dissected down through the subcutaneous tissue and fascia until I reached the axillary vein.  It was patent and adequate size for graft creation.  I then dissected this vein proximally and distal and prepared it for control with Bulldog clamps.  I took a Dietitian and dissected from the antecubital up to the axillary incision.  Then I delivered the 6 mm Propaten pTFE graft, through this metal tunneler and then pulled out the metal tunneler leaving the graft in place and making sure the line was up for orientation.  I then gave the patient 3000 units of heparin to gain some anticoagulation.  After waiting 3 minutes, I placed the brachial artery under tension proximally and distally with vessel loops, made an arteriotomy and extended it with a Potts scissor.  I sewed the graft to this arteriotomy with a running stitch of CV-6 suture.  At this point, then I completed the anastomosis in the usual fashion.  I released the vessel loops on the inflow and allowed the artery to decompress through the graft. There was good pulsatile bleeding through this graft.  I clamped the graft near its arterial anastomosis and sucked out all the blood in the graft and loaded the graft with heparinized saline.  At this point, I pulled the graft to appropriate length and reset my exposure of the axillary vein.  Then, I controlled the vein with Bulldog clamps.  An anterior wall venotomy was created with an 11 blade and extended with Potts scissors.  I then spatulated the graft  to facilitate an end-to- side anastomosis matching the arteriotomy.  In the process of spatulating, I cut the graft to appropriate length for this anastomosis.  This graft was sewn to the vein in an end-to-side configuration with a running CV-6 suture.  Prior to completing this anastomosis, I allowed the vein to back bleed and then I also allowed the artery to  bleed in an antegrade fashion.  I completed this anastomosis in the usual fashion, and then irrigated out the high bicipital exposure and then placed Surgicel and Evicel.  I then turned my attention back to the antecubitum.  There was pulsatile flow in the artery beyond the graft.   At this point, I washed out the antecubital incision. Surgicel and Evicel were then placed. There was no more active bleeding.  The subcutaneous tissue was reapproximated with a running stitch of 3-0 Vicryl.  The skin was then reapproximated with a running subcuticular 4-0 Monocryl.  The skin was then cleaned, dried, and Dermabond used to reinforce the skin closure.  We then turned our attention to the axillary incision.  The subcutaneous tissue was repaired with running stitch of 3-0 Vicryl.  The skin was then reapproximated with running subcuticular 4-0 Monocryl.  The skin was then cleaned, dried, and then the skin closure was reinforced with Dermabond.  The patient was then awakened from anesthesia and taken to the recovery room in stable condition having tolerated the procedure well.    COMPLICATIONS: None  CONDITION: Stable   Leotis Pain 08/12/2019 1:32 PM   This note was created with Dragon Medical transcription system. Any errors in dictation are purely unintentional.

## 2019-08-12 NOTE — Anesthesia Postprocedure Evaluation (Signed)
Anesthesia Post Note  Patient: Brett Small  Procedure(s) Performed: INSERTION OF ARTERIOVENOUS (AV) GORE-TEX GRAFT ARM (Right )  Patient location during evaluation: PACU Anesthesia Type: General Level of consciousness: awake and alert Pain management: pain level controlled Vital Signs Assessment: post-procedure vital signs reviewed and stable Respiratory status: spontaneous breathing, nonlabored ventilation, respiratory function stable and patient connected to nasal cannula oxygen Cardiovascular status: blood pressure returned to baseline and stable Postop Assessment: no apparent nausea or vomiting Anesthetic complications: no     Last Vitals:  Vitals:   08/12/19 1537 08/12/19 1544  BP: (!) 167/97 (!) 157/100  Pulse: 80 79  Resp: 20 17  Temp:  36.7 C  SpO2: 97% 92%    Last Pain:  Vitals:   08/12/19 1537  TempSrc:   PainSc: Asleep                 Martha Clan

## 2019-08-12 NOTE — Anesthesia Preprocedure Evaluation (Addendum)
Anesthesia Evaluation  Patient identified by MRN, date of birth, ID band Patient awake    Reviewed: Allergy & Precautions, H&P , NPO status , Patient's Chart, lab work & pertinent test results, reviewed documented beta blocker date and time   History of Anesthesia Complications Negative for: history of anesthetic complications  Airway Mallampati: III  TM Distance: >3 FB Neck ROM: full    Dental  (+) Upper Dentures, Lower Dentures   Pulmonary neg shortness of breath, neg COPD, neg recent URI, Current Smoker,    Pulmonary exam normal        Cardiovascular Exercise Tolerance: Good hypertension, (-) angina+ Past MI, + Peripheral Vascular Disease and +CHF  (-) Cardiac Stents Normal cardiovascular exam(-) dysrhythmias (-) Valvular Problems/Murmurs     Neuro/Psych Seizures -,  PSYCHIATRIC DISORDERS Depression    GI/Hepatic negative GI ROS, (+) Hepatitis -  Endo/Other  negative endocrine ROS  Renal/GU ESRF and DialysisRenal disease  negative genitourinary   Musculoskeletal   Abdominal   Peds  Hematology negative hematology ROS (+)   Anesthesia Other Findings Past Medical History: No date: ESRD (end stage renal disease) (HCC) No date: Hypertension No date: PAD (peripheral artery disease) (HCC)     Comment:  Required aortofemoral stent-which had closed and had to               redo the procedure and ischemia of limb. No date: Peripheral vascular disease (HCC) No date: Renal disorder No date: Secondary hyperparathyroidism of renal origin ([place])   Reproductive/Obstetrics negative OB ROS                            Anesthesia Physical Anesthesia Plan  ASA: IV  Anesthesia Plan: General   Post-op Pain Management:    Induction: Intravenous  PONV Risk Score and Plan: 1 and Ondansetron, Dexamethasone, Midazolam and Treatment may vary due to age or medical condition  Airway Management Planned:  LMA  Additional Equipment:   Intra-op Plan:   Post-operative Plan: Extubation in OR  Informed Consent: I have reviewed the patients History and Physical, chart, labs and discussed the procedure including the risks, benefits and alternatives for the proposed anesthesia with the patient or authorized representative who has indicated his/her understanding and acceptance.     Dental Advisory Given  Plan Discussed with: Anesthesiologist, CRNA and Surgeon  Anesthesia Plan Comments:         Anesthesia Quick Evaluation

## 2019-08-12 NOTE — Transfer of Care (Signed)
Immediate Anesthesia Transfer of Care Note  Patient: Hendrick Schielke  Procedure(s) Performed: INSERTION OF ARTERIOVENOUS (AV) GORE-TEX GRAFT ARM (Right )  Patient Location: PACU  Anesthesia Type:General  Level of Consciousness: sedated  Airway & Oxygen Therapy: Patient Spontanous Breathing and Patient connected to face mask  Post-op Assessment: Report given to RN and Post -op Vital signs reviewed and stable  Post vital signs: Reviewed and stable  Last Vitals:  Vitals Value Taken Time  BP 162/100 08/12/19 1352  Temp    Pulse 80 08/12/19 1353  Resp 15 08/12/19 1353  SpO2 91 % 08/12/19 1353  Vitals shown include unvalidated device data.  Last Pain:  Vitals:   08/12/19 1056  TempSrc: Temporal  PainSc:       Patients Stated Pain Goal: 0 ([id number])  Complications: No apparent anesthesia complications

## 2019-08-12 NOTE — Progress Notes (Signed)
ANTICOAGULATION CONSULT NOTE  Pharmacy Consult for Heparin Indication: ACS/STEMI  Patient Measurements: Height: 5\' 1"  (154.9 cm) Weight: 100 lb 5 oz (45.5 kg) IBW/kg (Calculated) : 52.3 HEPARIN DW (KG): 46.2  Vital Signs: Temp: 97.6 F (36.4 C) (10/20 2033) Temp Source: Oral (10/20 2033) BP: 154/112 (10/20 2033) Pulse Rate: 113 (10/20 2033)  Labs: Recent Labs    08/09/19 0438 08/10/19 0454 08/11/19 0538 08/11/19 1309 08/11/19 2159  HGB 8.1* 8.5* 8.9*  --   --   HCT 25.3* 26.8* 27.9*  --   --   PLT 140* 161 186  --   --   APTT 71* 92* 49* 155* 55*  HEPARINUNFRC  --   --  0.20*  --  0.23*  CREATININE 2.65* 3.85* 4.67*  --   --    Estimated Creatinine Clearance: 12.9 mL/min (A) (by C-G formula based on SCr of 4.67 mg/dL (H)).  Medical History: Past Medical History:  Diagnosis Date  . ESRD (end stage renal disease) ([place])   . Hypertension   . PAD (peripheral artery disease) (H[facility])    Required aortofemoral stent-which had closed and had to redo the procedure and ischemia of limb.  . Peripheral vascular disease ([place])   . Renal disorder   . Secondary hyperparathyroidism of renal origin [facility])    Medications:  Medications Prior to Admission  Medication Sig Dispense Refill Last Dose  . amLODipine (NORVASC) 10 MG tablet Take 1 tablet (10 mg total) by mouth daily at 8 pm. 30 tablet 2 unknown at unknown  . apixaban (ELIQUIS) 2.5 MG TABS tablet Take 1 tablet (2.5 mg total) by mouth 2 (two) times daily. 60 tablet 1 unknown at unknown  . aspirin EC 81 MG tablet Take 1 tablet (81 mg total) by mouth daily. 30 tablet 1 unknown at unknown  . atorvastatin (LIPITOR) 80 MG tablet Take 1 tablet (80 mg total) by mouth at bedtime. 30 tablet 1 unknown at unknown  . b complex-C-folic acid 1 MG capsule Take 1 capsule by mouth daily after supper.    unknown at unknown  . carvedilol (COREG) 25 MG tablet Take 1 tablet (25 mg total) by mouth 2 (two) times daily with a meal. 60 tablet 1 unknown at  unknown  . gabapentin (NEURONTIN) 100 MG capsule Take 1 capsule (100 mg total) by mouth 3 (three) times daily. 90 capsule 1 unknown at unknown  . hydrALAZINE (APRESOLINE) 25 MG tablet Take 1 tablet (25 mg total) by mouth every 8 (eight) hours. 90 tablet 1 unknown at unknown  . hydrOXYzine (ATARAX/VISTARIL) 25 MG tablet Take 25 mg by mouth 3 (three) times daily as needed for anxiety.   unknown at unknown  . loratadine (CLARITIN) 10 MG tablet Take 1 tablet (10 mg total) by mouth daily. 30 tablet 0 unknown at unknown  . multivitamin (RENA-VIT) TABS tablet Take 1 tablet by mouth daily. 30 each 1 unknown at unknown  . neomycin-bacitracin-polymyxin (NEOSPORIN) OINT Apply 1 application topically 2 (two) times daily. 56 g 1 unknown at unknown  . Nutritional Supplements (FEEDING SUPPLEMENT, NEPRO CARB STEADY,) LIQD Take 237 mLs by mouth 2 (two) times daily between meals. 237 mL 30 unknown at unknown  . sevelamer carbonate (RENVELA) 800 MG tablet Take 2 tablets (1,600 mg total) by mouth 3 (three) times daily with meals. 180 tablet 1 unknown at unknown    Assessment: Pharmacy asked to initiate Heparin protocol for a suspected PE vs CVA s/p MI.  Pt has been on Apixaban w/  last dose being given on 10/13 at 2117.  H&H, PLT trending up: continue to monitor  Heparin Course: 10/14 am initiation: 1000 unit bolus, then 700 units/hr 10/14 1318: inc to 750 units/hr 10/14 2325 aPTT = 89, therapeutic 10/15 0422 aPTT = 77, therapeutic x 2.  CBC stable.  10/16 0312 aPTT = 66, therapeutic x 3.  CBC stable.  10/17 0347 aPTT = 72, therapeutic x 4.  CBC stable.  10/18 0438 aPTT = 71, therapeutic x 5.  CBC stable.  10/19 0454 aPTT = 92, therapeutic x 6.  CBC stable.  10/20 1309 aPTT = 155, supratherapeutic  Goal of Therapy:  aPTT 66-102 seconds Monitor platelets by anticoagulation protocol: Yes   Plan:  10/20 @ 2200 HL 0.23, aPTT 55 both subtherapeutic, not correlating. Will increase rate to 900 units/hr, won't have  time to recheck an aPTT since drip is going to be stopped at 0600.  Tobie Lords, PharmD 08/12/2019,12:54 AM

## 2019-08-12 NOTE — Anesthesia Post-op Follow-up Note (Signed)
Anesthesia QCDR form completed.        

## 2019-08-12 NOTE — Progress Notes (Signed)
Patient ID: Brett Small, male   DOB: 1976/02/08, 43 y.o.   MRN: [id number]  [facility] PROGRESS NOTE  Brett Small [id number] DOB: 10/10/1976 DOA: 08/03/2019 PCP: Patient, [name]  HPI/Subjective: Patient is awaiting for procedure today n.p.o. Patient was extubated 08/07/2019.     Objective: Vitals:   08/12/19 1537 08/12/19 1544  BP: (!) 167/97 (!) 157/100  Pulse: 80 79  Resp: 20 17  Temp:  98.1 F (36.7 C)  SpO2: 97% 92%    Intake/Output Summary (Last 24 hours) at 08/12/2019 1550 Last data filed at 08/12/2019 1357 Gross per 24 hour  Intake 440 ml  Output 100 ml  Net 340 ml   Filed Weights   08/11/19 0929 08/11/19 1233 08/12/19 0338  Weight: 46.7 kg 45.5 kg 44.6 kg    ROS: Review of Systems  Constitutional: Negative for chills, diaphoresis and fever.  HENT: Negative for ear discharge, hearing loss and nosebleeds.   Eyes: Negative for blurred vision and pain.  Respiratory: Negative for cough, hemoptysis and shortness of breath.   Cardiovascular: Negative for palpitations and orthopnea.  Gastrointestinal: Negative for abdominal pain, diarrhea, nausea and vomiting.  Skin: Negative for itching and rash.  Neurological: Negative for dizziness, tingling, speech change and weakness.  Psychiatric/Behavioral: Negative for depression. The patient is not nervous/anxious.    Exam: Physical Exam  HENT:  Mouth/Throat: No oropharyngeal exudate.  Unable to look on the mouth  Eyes: Pupils are equal, round, and reactive to light. Conjunctivae and lids are normal.  Neck: Trachea normal. Carotid bruit is not present.  Cardiovascular: Regular rhythm, S1 normal, S2 normal and normal heart sounds.  Respiratory: He has decreased breath sounds in the right lower field and the left lower field. He has no wheezes. He has rhonchi in the right lower field and the left lower field. He has no rales.  GI: Soft. Bowel sounds are normal. There is no abdominal tenderness.   Musculoskeletal:     Right ankle: He exhibits no swelling.     Left ankle: He exhibits no swelling.  Neurological: He is alert.  Follows commands and able to move all extremities.  Skin: Skin is warm. No rash noted. Nails show no clubbing.  Psychiatric: He has a normal mood and affect.      Data Reviewed: Basic Metabolic Panel: Recent Labs  Lab 08/06/19 1124  08/07/19 0312 08/08/19 0347 08/09/19 0438 08/10/19 0454 08/11/19 0538 08/12/19 0459  NA  --    < > 137 140 139 137 137 141  K  --    < > 3.8 4.7 3.4* 3.7 3.5 3.7  CL  --    < > 102 104 100 99 101 102  CO2  --    < > 27 25 27 24 23 25   GLUCOSE  --    < > 129* 132* 127* 138* 115* 132*  BUN  --    < > 22* 33* 20 35* 46* 29*  CREATININE  --    < > 2.57* 4.01* 2.65* 3.85* 4.67* 3.44*  CALCIUM  --    < > 8.4* 8.9 8.6* 8.5* 8.6* 8.6*  MG  --    < > 1.7 2.8* 1.9  --  2.1 1.9  PHOS 9.1*  --   --   --  3.8  --  4.4  --    < > = values in this interval not displayed.   Liver Function Tests: Recent Labs  Lab 08/06/19 0422 08/11/19 [id number]  AST 79*  --   ALT 66*  --   ALKPHOS 75  --   BILITOT 1.7*  --   PROT 5.8*  --   ALBUMIN 2.4* 2.9*   CBC: Recent Labs  Lab 08/05/19 1819  08/06/19 1958  08/08/19 0347 08/09/19 0438 08/10/19 0454 08/11/19 0538 08/12/19 0459  WBC 8.0   < > 8.2   < > 11.2* 11.7* 9.5 11.0* 8.3  NEUTROABS 7.1  --  7.0  --   --   --   --   --   --   HGB 8.3*   < > 7.4*   < > 7.8* 8.1* 8.5* 8.9* 9.1*  HCT 26.3*   < > 22.8*   < > 25.3* 25.3* 26.8* 27.9* 28.9*  MCV 102.3*   < > 97.9   < > 102.8* 101.2* 100.4* 101.1* 101.4*  PLT 122*   < > 124*   < > 131* 140* 161 186 205   < > = values in this interval not displayed.   BNP (last 3 results) Recent Labs    07/20/19 1029 08/03/19 0925 08/05/19 0431  BNP >4,500.0* >4,500.0* 3,772.0*     CBG: Recent Labs  Lab 08/11/19 2249 08/11/19 2358 08/12/19 0257 08/12/19 0557 08/12/19 1050  GLUCAP 132* 73 119* 131* 103*    Recent Results (from the  past 240 hour(s))  SARS Coronavirus 2 by RT PCR (hospital order, performed in [facility] hospital lab) Nasopharyngeal Nasopharyngeal Swab     Status: None   Collection Time: 08/03/19  9:25 AM   Specimen: Nasopharyngeal Swab  Result Value Ref Range Status   SARS Coronavirus 2 NEGATIVE NEGATIVE Final    Comment: (NOTE) If result is NEGATIVE SARS-CoV-2 target nucleic acids are NOT DETECTED. The SARS-CoV-2 RNA is generally detectable in upper and lower  respiratory specimens during the acute phase of infection. The lowest  concentration of SARS-CoV-2 viral copies this assay can detect is 250  copies / mL. A negative result does not preclude SARS-CoV-2 infection  and should not be used as the sole basis for treatment or other  patient management decisions.  A negative result may occur with  improper specimen collection / handling, submission of specimen other  than nasopharyngeal swab, presence of viral mutation(s) within the  areas targeted by this assay, and inadequate number of viral copies  (<250 copies / mL). A negative result must be combined with clinical  observations, patient history, and epidemiological information. If result is POSITIVE SARS-CoV-2 target nucleic acids are DETECTED. The SARS-CoV-2 RNA is generally detectable in upper and lower  respiratory specimens dur ing the acute phase of infection.  Positive  results are indicative of active infection with SARS-CoV-2.  Clinical  correlation with patient history and other diagnostic information is  necessary to determine patient infection status.  Positive results do  not rule out bacterial infection or co-infection with other viruses. If result is PRESUMPTIVE POSTIVE SARS-CoV-2 nucleic acids MAY BE PRESENT.   A presumptive positive result was obtained on the submitted specimen  and confirmed on repeat testing.  While 2019 novel coronavirus  (SARS-CoV-2) nucleic acids may be present in the submitted sample  additional  confirmatory testing may be necessary for epidemiological  and / or clinical management purposes  to differentiate between  SARS-CoV-2 and other Sarbecovirus currently known to infect humans.  If clinically indicated additional testing with an alternate test  methodology [phone]) is advised. The SARS-CoV-2 RNA is generally  detectable in upper  and lower respiratory sp ecimens during the acute  phase of infection. The expected result is Negative. Fact Sheet for Patients:  StrictlyIdeas.no Fact Sheet for Healthcare Providers: BankingDealers.co.za This test is not yet approved or cleared by the [place] FDA and has been authorized for detection and/or diagnosis of SARS-CoV-2 by FDA under an Emergency Use Authorization (EUA).  This EUA will remain in effect (meaning this test can be used) for the duration of the COVID-19 declaration under Section 564(b)(1) of the Act, 21 U.S.C. section 360bbb-3(b)(1), unless the authorization is terminated or revoked sooner. Performed at [facility], [address] [place], [place] [address]   Blood culture (routine x 2)     Status: None   Collection Time: 08/03/19 10:05 AM   Specimen: BLOOD  Result Value Ref Range Status   Specimen Description BLOOD BLOOD RIGHT FOREARM  Final   Special Requests   Final    BOTTLES DRAWN AEROBIC AND ANAEROBIC Blood Culture adequate volume   Culture   Final    NO GROWTH 5 DAYS Performed at [facility], [address] [place], [place] [address]    Report Status 08/08/2019 FINAL  Final  MRSA PCR Screening     Status: None   Collection Time: 08/04/19  7:56 AM   Specimen: Nasopharyngeal  Result Value Ref Range Status   MRSA by PCR NEGATIVE NEGATIVE Final    Comment:        The GeneXpert MRSA Assay (FDA approved for NASAL specimens only), is one component of a comprehensive MRSA colonization surveillance program. It is not intended to  diagnose MRSA infection nor to guide or monitor treatment for MRSA infections. Performed at [facility], [address], [place], [place] [address]   Urine Culture     Status: None   Collection Time: 08/05/19 10:14 AM   Specimen: Urine, Random  Result Value Ref Range Status   Specimen Description   Final    URINE, RANDOM Performed at [facility], [address], [place], [place] [address]    Special Requests   Final    NONE Performed at [facility], [address] [place], [place] [address]    Culture   Final    NO GROWTH Performed at [facility], [place] [address], [place], [place] [address]    Report Status 08/06/2019 FINAL  Final  Culture, respiratory (non-expectorated)     Status: None   Collection Time: 08/05/19  8:40 PM   Specimen: Tracheal Aspirate; Respiratory  Result Value Ref Range Status   Specimen Description   Final    TRACHEAL ASPIRATE Performed at [facility], [address] [place], [place] [address]    Special Requests   Final    Normal Performed at [facility], [address] [place], [place] [address]    Gram Stain   Final    FEW WBC PRESENT,BOTH PMN AND MONONUCLEAR NO ORGANISMS SEEN    Culture   Final    RARE Consistent with normal respiratory flora. Performed at [facility], [place] [address], [place], [place] [address]    Report Status 08/08/2019 FINAL  Final     Studies: No results found.  Scheduled Meds: . [MAR Hold] aspirin  81 mg Per Tube Daily  . [MAR Hold] Chlorhexidine Gluconate Cloth  6 each Topical Q0600  . [MAR Hold] epoetin (EPOGEN/PROCRIT) injection  10,000 Units Intravenous Q T,Th,Sa-HD  . [MAR Hold] feeding supplement (NEPRO CARB  STEADY)  237 mL Oral BID BM  . [MAR Hold] hydrocortisone sod succinate (SOLU-CORTEF) inj  100 mg Intravenous Q12H  . [MAR Hold] insulin aspart  0-9 Units Subcutaneous Q6H  . labetalol      . [MAR Hold] multivitamin  1 tablet Oral QHS   . [MAR Hold] pantoprazole  20 mg Oral Daily  . [MAR Hold] pantoprazole (PROTONIX) IV  40 mg Intravenous QHS  . [MAR Hold] potassium chloride  40 mEq Oral Once   Continuous Infusions: . [MAR Hold] levETIRAcetam 500 mg (08/12/19 0853)    Assessment/Plan:  1. PEA arrest.  Patient had return of spontaneous circulation following ACLS.   VQ scan ruled out pulmonary embolism, is probably from hypotension following hemodialysis. patient is a DNR.  2. Acute hypoxic and hypercapnic respiratory failure.  Patient extubated 08/07/2019 and placed on nasal cannula..  On cefepime.  VQ scan negative for pulmonary embolism 3. Acute on chronic systolic congestive heart failure with EF less than 20.  Dialysis to manage fluid.  Nephrology is following 4. End-stage renal disease on hemodialysis.  Tuesday Thursday Saturday schedule.  Hemodialysis scheduled for tomorrow 5. Possible seizure on Keppra.  Continue Keppra 500 twice daily.  EEG with diffuse slowing without any sharps.  Neurology recommended reimaging of the brain in a few days.  Patient following commands. 6. 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  Plans to do hero graft on the right side today followed by angio for  the left on Thursday.  N.p.o. after midnight.  Appreciate vascular recommendations 7. History of PAD- vascular surgery follow-up Plan of care discussed with the patient and intensivist Code Status:     Code Status Orders  (From admission, onward)         Start     Ordered   08/05/19 0653  Do not attempt resuscitation (DNR)  Continuous    Question Answer Comment  In the event of cardiac or respiratory ARREST Do not call a "code blue"   In the event of cardiac or respiratory ARREST Do not perform Intubation, CPR, defibrillation or ACLS   In the event of cardiac or respiratory ARREST Use medication by any route, position, wound care, and other measures to relive pain and suffering. May use oxygen, suction and manual treatment of  airway obstruction as needed for comfort.      08/05/19 [id number]        Code Status History    Date Active Date Inactive Code Status Order ID Comments User Context   08/05/2019 0613 08/05/2019 0652 Full Code [id number]  [name], NP Inpatient   08/04/2019 0127 08/05/2019 0613 Full Code [id number]  Mansy, Arvella Merles, MD ED   07/27/2019 1110 07/29/2019 1719 Full Code [id number]  Hillary Bow, MD ED   07/20/2019 2041 07/23/2019 1929 DNR [id number]  Vaughan Basta, MD Inpatient   07/09/2019 0543 07/12/2019 1502 Full Code [id number]  Harrie Foreman, MD Inpatient   07/02/2019 2128 07/06/2019 1557 Full Code [id number]  Henreitta Leber, MD Inpatient   05/19/2019 0603 05/22/2019 1957 Full Code [id number]  Lance Coon, MD Inpatient   02/28/2019 1805 03/03/2019 2234 Full Code [id number]  Vaughan Basta, MD Inpatient   02/21/2019 0835 02/24/2019 1945 Full Code [id number]  Harrie Foreman, MD Inpatient   02/04/2019 1343 02/09/2019 2050 Full Code [id number]  Saundra Shelling, MD ED   01/27/2019 0654 01/29/2019 2049 Partial Code [id number]  Harrie Foreman, MD Inpatient   01/07/2019 2156 01/27/2019 0638 Partial Code [id number]  O'Neal, Fredirick Maudlin,  MD Inpatient   12/25/2018 0158 01/07/2019 2027 Partial Code [id number]  Ina Homes, MD ED   Advance Care Planning Activity     Family Communication: As per critical care specialist Disposition Plan: To be determined  Consultants:  Critical care specialist  Cardiology  Nephrology  Antibiotics:  Cefepime  Time spent: 33 minutes.  Case discussed with pt and nursing staff.  Nicholes Mango  [employer]

## 2019-08-12 NOTE — Progress Notes (Signed)
Pts femoral dressing changed at 06:30. The dressing on the rt perm cath had completely come off. Area cleaned and sanitized with sterile procedure and new dressing placed to keep area clean. I will pass on tho oncoming shift that this needs to be reassessed.

## 2019-08-12 NOTE — Progress Notes (Signed)
[facility]  ROUNDING NOTE   Subjective:   Hemodialysis treatment yesterday. Tolerated treatment well. UF of 1.4 liters.   Objective:  Vital signs in last 24 hours:  Temp:  [97.6 F (36.4 C)-98.8 F (37.1 C)] 98.5 F (36.9 C) (10/21 0725) Pulse Rate:  [54-113] 102 (10/21 1049) Resp:  [16-20] 16 (10/21 1049) BP: (141-167)/(92-122) 154/109 (10/21 1049) SpO2:  [89 %-100 %] 92 % (10/21 1049) Weight:  [44.6 kg-45.5 kg] 44.6 kg (10/21 0338)  Weight change: -0.02 kg Filed Weights   08/11/19 0929 08/11/19 1233 08/12/19 0338  Weight: 46.7 kg 45.5 kg 44.6 kg    Intake/Output: I/O last 3 completed shifts: In: 240 [P.O.:240] Out: 1504 [Urine:100; [id number]   Intake/Output this shift:  Total I/O In: -  Out: 50 [Urine:50]  Physical Exam: General: No acute distress  Head: [place]/AT    Eyes: Anicteric  Neck: Supple, trachea midline  Lungs:  Clear to auscultation, normal effort  Heart: regular  Abdomen:  Soft, nontender, bowel sounds present  Extremities: No peripheral edema.  Neurologic: Awake, alert, follows commands  Skin: No lesions  Access: LUE AVF - anuerysm, Right IJ PC,        Basic Metabolic Panel: Recent Labs  Lab 08/06/19 1124  08/07/19 0312 08/08/19 0347 08/09/19 0438 08/10/19 0454 08/11/19 0538 08/12/19 0459  NA  --    < > 137 140 139 137 137 141  K  --    < > 3.8 4.7 3.4* 3.7 3.5 3.7  CL  --    < > 102 104 100 99 101 102  CO2  --    < > 27 25 27 24 23 25   GLUCOSE  --    < > 129* 132* 127* 138* 115* 132*  BUN  --    < > 22* 33* 20 35* 46* 29*  CREATININE  --    < > 2.57* 4.01* 2.65* 3.85* 4.67* 3.44*  CALCIUM  --    < > 8.4* 8.9 8.6* 8.5* 8.6* 8.6*  MG  --    < > 1.7 2.8* 1.9  --  2.1 1.9  PHOS 9.1*  --   --   --  3.8  --  4.4  --    < > = values in this interval not displayed.    Liver Function Tests: Recent Labs  Lab 08/06/19 0422 08/11/19 0538  AST 79*  --   ALT 66*  --   ALKPHOS 75  --   BILITOT 1.7*  --   PROT 5.8*  --    ALBUMIN 2.4* 2.9*   No results for input(s): LIPASE, AMYLASE in the last 168 hours. No results for input(s): AMMONIA in the last 168 hours.  CBC: Recent Labs  Lab 08/05/19 1132 08/05/19 1819  08/06/19 1958  08/08/19 0347 08/09/19 0438 08/10/19 0454 08/11/19 0538 08/12/19 0459  WBC 12.1* 8.0   < > 8.2   < > 11.2* 11.7* 9.5 11.0* 8.3  NEUTROABS 9.9* 7.1  --  7.0  --   --   --   --   --   --   HGB 8.2* 8.3*   < > 7.4*   < > 7.8* 8.1* 8.5* 8.9* 9.1*  HCT 26.3* 26.3*   < > 22.8*   < > 25.3* 25.3* 26.8* 27.9* 28.9*  MCV 102.7* 102.3*   < > 97.9   < > 102.8* 101.2* 100.4* 101.1* 101.4*  PLT 134* 122*   < > 124*   < >  131* 140* 161 186 205   < > = values in this interval not displayed.    Cardiac Enzymes: No results for input(s): CKTOTAL, CKMB, CKMBINDEX, TROPONINI in the last 168 hours.  BNP: Invalid input(s): POCBNP  CBG: Recent Labs  Lab 08/11/19 2249 08/11/19 2358 08/12/19 0257 08/12/19 0557 08/12/19 1050  GLUCAP 132* 73 119* 131* 103*    Microbiology: Results for orders placed or performed during the hospital encounter of 08/03/19  SARS Coronavirus 2 by RT PCR (hospital order, performed in [facility] hospital lab) Nasopharyngeal Nasopharyngeal Swab     Status: None   Collection Time: 08/03/19  9:25 AM   Specimen: Nasopharyngeal Swab  Result Value Ref Range Status   SARS Coronavirus 2 NEGATIVE NEGATIVE Final    Comment: (NOTE) If result is NEGATIVE SARS-CoV-2 target nucleic acids are NOT DETECTED. The SARS-CoV-2 RNA is generally detectable in upper and lower  respiratory specimens during the acute phase of infection. The lowest  concentration of SARS-CoV-2 viral copies this assay can detect is 250  copies / mL. A negative result does not preclude SARS-CoV-2 infection  and should not be used as the sole basis for treatment or other  patient management decisions.  A negative result may occur with  improper specimen collection / handling, submission of specimen  other  than nasopharyngeal swab, presence of viral mutation(s) within the  areas targeted by this assay, and inadequate number of viral copies  (<250 copies / mL). A negative result must be combined with clinical  observations, patient history, and epidemiological information. If result is POSITIVE SARS-CoV-2 target nucleic acids are DETECTED. The SARS-CoV-2 RNA is generally detectable in upper and lower  respiratory specimens dur ing the acute phase of infection.  Positive  results are indicative of active infection with SARS-CoV-2.  Clinical  correlation with patient history and other diagnostic information is  necessary to determine patient infection status.  Positive results do  not rule out bacterial infection or co-infection with other viruses. If result is PRESUMPTIVE POSTIVE SARS-CoV-2 nucleic acids MAY BE PRESENT.   A presumptive positive result was obtained on the submitted specimen  and confirmed on repeat testing.  While 2019 novel coronavirus  (SARS-CoV-2) nucleic acids may be present in the submitted sample  additional confirmatory testing may be necessary for epidemiological  and / or clinical management purposes  to differentiate between  SARS-CoV-2 and other Sarbecovirus currently known to infect humans.  If clinically indicated additional testing with an alternate test  methodology [phone]) is advised. The SARS-CoV-2 RNA is generally  detectable in upper and lower respiratory sp ecimens during the acute  phase of infection. The expected result is Negative. Fact Sheet for Patients:  StrictlyIdeas.no Fact Sheet for Healthcare Providers: BankingDealers.co.za This test is not yet approved or cleared by the [place] FDA and has been authorized for detection and/or diagnosis of SARS-CoV-2 by FDA under an Emergency Use Authorization (EUA).  This EUA will remain in effect (meaning this test can be used) for the duration  of the COVID-19 declaration under Section 564(b)(1) of the Act, 21 U.S.C. section 360bbb-3(b)(1), unless the authorization is terminated or revoked sooner. Performed at [facility], [address] [place], [place] [address]   Blood culture (routine x 2)     Status: None   Collection Time: 08/03/19 10:05 AM   Specimen: BLOOD  Result Value Ref Range Status   Specimen Description BLOOD BLOOD RIGHT FOREARM  Final   Special Requests   Final  BOTTLES DRAWN AEROBIC AND ANAEROBIC Blood Culture adequate volume   Culture   Final    NO GROWTH 5 DAYS Performed at [facility], [address] [place], [place] [address]    Report Status 08/08/2019 FINAL  Final  MRSA PCR Screening     Status: None   Collection Time: 08/04/19  7:56 AM   Specimen: Nasopharyngeal  Result Value Ref Range Status   MRSA by PCR NEGATIVE NEGATIVE Final    Comment:        The GeneXpert MRSA Assay (FDA approved for NASAL specimens only), is one component of a comprehensive MRSA colonization surveillance program. It is not intended to diagnose MRSA infection nor to guide or monitor treatment for MRSA infections. Performed at [facility], [address] [place], [place] [address]   Urine Culture     Status: None   Collection Time: 08/05/19 10:14 AM   Specimen: Urine, Random  Result Value Ref Range Status   Specimen Description   Final    URINE, RANDOM Performed at [facility], [address], [place], [place] [address]    Special Requests   Final    NONE Performed at [facility], [address], [place], [place] [address]    Culture   Final    NO GROWTH Performed at [facility], [place] [address], [place], [place] [address]    Report Status 08/06/2019 FINAL  Final  Culture, respiratory (non-expectorated)     Status: None   Collection Time: 08/05/19  8:40 PM   Specimen: Tracheal Aspirate; Respiratory  Result Value Ref Range Status   Specimen  Description   Final    TRACHEAL ASPIRATE Performed at [facility], [address] [place], [place] [address]    Special Requests   Final    Normal Performed at [facility], [address] [place], [place] [address]    Gram Stain   Final    FEW WBC PRESENT,BOTH PMN AND MONONUCLEAR NO ORGANISMS SEEN    Culture   Final    RARE Consistent with normal respiratory flora. Performed at [facility], [place] [address] [place], [place] [address]    Report Status 08/08/2019 FINAL  Final    Coagulation Studies: Recent Labs    08/12/19 0459  LABPROT 17.6*  INR 1.5*    Urinalysis: No results for input(s): COLORURINE, LABSPEC, PHURINE, GLUCOSEU, HGBUR, BILIRUBINUR, KETONESUR, PROTEINUR, UROBILINOGEN, NITRITE, LEUKOCYTESUR in the last 72 hours.  Invalid input(s): APPERANCEUR    Imaging: Nm Pulmonary Perf And Vent  Result Date: 08/10/2019 CLINICAL DATA:  Shortness of breath EXAM: NUCLEAR MEDICINE VENTILATION - PERFUSION LUNG SCAN Views: Anterior, posterior, left lateral, right lateral, RPO, LPO, RAO, LAO-ventilation perfusion RADIOPHARMACEUTICALS:  9.9 mCi of Tc-76m DTPA aerosol inhalation and 4.34 mCi Tc109m MAA IV COMPARISON:  Chest radiograph August 10, 2019 FINDINGS: Ventilation: No focal ventilation defects are appreciable. There is cardiomegaly which causes a degree of volume loss in the left lower lobe region. Perfusion: No focal perfusion defects are evident. Note that there is cardiomegaly. IMPRESSION: No appreciable ventilation or perfusion defects. Very low probability of pulmonary embolus. Cardiomegaly noted. Electronically Signed   By: Lowella Grip III M.D.   On: 08/10/2019 13:07   Dg Chest Port 1 View  Result Date: 08/10/2019 CLINICAL DATA:  Pt has a cough. Hx of hypertension, ESRD, perm cath. Current everyday smoker EXAM: PORTABLE CHEST 1 VIEW COMPARISON:  Radiograph 08/06/2011 FINDINGS: Large-bore central venous line  unchanged. Interval  extubation. Removal of NG tube. Stable enlarged cardiac silhouette. There is a moderate LEFT effusion similar prior. Central venous congestion slightly increased. Vascular stent noted in the LEFT axilla IMPRESSION: 1. Extubation without complication. 2. Increase in central venous congestion. 3. Stable LEFT pleural effusion. Electronically Signed   By: Suzy Bouchard M.D.   On: 08/10/2019 11:57     Medications:   . ceFAZolin    . [MAR Hold]  ceFAZolin (ANCEF) IV    . [MAR Hold] levETIRAcetam 500 mg (08/12/19 0853)   . [MAR Hold] aspirin  81 mg Per Tube Daily  . [MAR Hold] Chlorhexidine Gluconate Cloth  6 each Topical Q0600  . [MAR Hold] epoetin (EPOGEN/PROCRIT) injection  10,000 Units Intravenous Q T,Th,Sa-HD  . [MAR Hold] feeding supplement (NEPRO CARB STEADY)  237 mL Oral BID BM  . [MAR Hold] hydrocortisone sod succinate (SOLU-CORTEF) inj  100 mg Intravenous Q12H  . [MAR Hold] insulin aspart  0-9 Units Subcutaneous Q6H  . [MAR Hold] multivitamin  1 tablet Oral QHS  . [MAR Hold] pantoprazole  20 mg Oral Daily  . [MAR Hold] pantoprazole (PROTONIX) IV  40 mg Intravenous QHS  . [MAR Hold] potassium chloride  40 mEq Oral Once   [MAR Hold] acetaminophen, [MAR Hold] albuterol, [MAR Hold] labetalol, [MAR Hold] nitroGLYCERIN, [MAR Hold] ondansetron (ZOFRAN) IV, [MAR Hold] promethazine, [MAR Hold] zolpidem  Assessment/ Plan:  43 y.o.white male with end-stage renal disease on hemodialysis, hypertension, peripheral vascular disease, depression.  [place] TTS  Left AVG  1.  ESRD on HD TTS.  Seen and examined on hemodialysis - with complication of dialysis device - anuerysmal AVG - Vascular for revision for today  2.  Acute respiratory failure/PEA arrest.  V/Q was low probablility - heparin gtt. Appreciate cardiology and neurology input - Started on Keppra for possible seizure.   3.  Anemia of chronic kidney disease. - EPO with HD treatment  4.  Secondary  hyperparathyroidism: not currently on binders.   5. Hypertension:  - IV labetalol PRN   LOS: 8 Tationa Stech 10/21/202010:56 AM

## 2019-08-12 NOTE — Consult Note (Signed)
[place] for Electrolyte Monitoring and Replacement   Recent Labs: Potassium (mmol/L)  Date Value  08/12/2019 3.7   Magnesium (mg/dL)  Date Value  08/12/2019 1.9   Calcium (mg/dL)  Date Value  08/12/2019 8.6 (L)   Albumin (g/dL)  Date Value  08/11/2019 2.9 (L)   Phosphorus (mg/dL)  Date Value  08/11/2019 4.4   Sodium (mmol/L)  Date Value  08/12/2019 141   Corrected Ca: 9.68 mg/dL  Assessment: 43 y.o. male admitted on 08/03/2019 with sepsis. On 10/14, pt became suddenly altered and then unresponsive with agonal respirations, leading to respiratory arrest with bradycardia and eventual PEA arrest requiring approximately 10 minutes of CPR/ACLS before ROSC was achieved. Pt was intubated during arrest, but has since been extubated.  Patient receives dialysis TThSat  Goal of Therapy:  Due to cardiac history: Potassium 4.0 - 5.1 mmol/L Magnesium 2.0 - 2.4 mg/dL Other electrolytes WNL  Plan:  KCl 3.7, goal ~4.  Will repeat KCl 40 mEq x 1 dose, as patient is slightly subtherapeutic. Mag 1.9, goal ~2.  Will give Mag 2g IV bolus x 1 dose. Follow electrolytes in the AM.  Gerald Dexter, PharmD Pharmacy Resident  08/12/2019 8:00 AM

## 2019-08-12 NOTE — H&P (Signed)
[facility] History & Physical Update  The patient was interviewed and re-examined.  The patient's previous History and Physical has been reviewed and is unchanged.  There is no change in the plan of care. We plan to proceed with the scheduled procedure.  Leotis Pain, MD  08/12/2019, 9:36 AM

## 2019-08-13 ENCOUNTER — Encounter: Payer: Self-pay | Admitting: Vascular Surgery

## 2019-08-13 ENCOUNTER — Encounter
Admission: EM | Disposition: A | Discharge status: Home Health | Payer: Self-pay | Source: Home / Self Care | Attending: Internal Medicine

## 2019-08-13 DIAGNOSIS — N186 End stage renal disease: Secondary | ICD-10-CM

## 2019-08-13 DIAGNOSIS — Z992 Dependence on renal dialysis: Secondary | ICD-10-CM

## 2019-08-13 DIAGNOSIS — T82898A Other specified complication of vascular prosthetic devices, implants and grafts, initial encounter: Secondary | ICD-10-CM

## 2019-08-13 HISTORY — PX: UPPER EXTREMITY ANGIOGRAPHY: CATH118270

## 2019-08-13 LAB — CBC
HCT: 27.9 % — ABNORMAL LOW (ref 39.0–52.0)
Hemoglobin: 8.8 g/dL — ABNORMAL LOW (ref 13.0–17.0)
MCH: 32.4 pg (ref 26.0–34.0)
MCHC: 31.5 g/dL (ref 30.0–36.0)
MCV: 102.6 fL — ABNORMAL HIGH (ref 80.0–100.0)
Platelets: 162 10*3/uL (ref 150–400)
RBC: 2.72 MIL/uL — ABNORMAL LOW (ref 4.22–5.81)
RDW: 19.5 % — ABNORMAL HIGH (ref 11.5–15.5)
WBC: 9.7 10*3/uL (ref 4.0–10.5)
nRBC: 0.6 % — ABNORMAL HIGH (ref 0.0–0.2)

## 2019-08-13 LAB — BPAM RBC
Blood Product Expiration Date: 202011242359
Blood Product Expiration Date: 202011242359
Blood Product Expiration Date: 202011282359
Blood Product Expiration Date: 202011282359
Unit Type and Rh: 6200
Unit Type and Rh: 6200
Unit Type and Rh: 6200
Unit Type and Rh: 6200

## 2019-08-13 LAB — BASIC METABOLIC PANEL
Anion gap: 18 — ABNORMAL HIGH (ref 5–15)
BUN: 42 mg/dL — ABNORMAL HIGH (ref 6–20)
CO2: 22 mmol/L (ref 22–32)
Calcium: 8.5 mg/dL — ABNORMAL LOW (ref 8.9–10.3)
Chloride: 99 mmol/L (ref 98–111)
Creatinine, Ser: 4.65 mg/dL — ABNORMAL HIGH (ref 0.61–1.24)
GFR calc Af Amer: 16 mL/min — ABNORMAL LOW (ref >60)
GFR calc non Af Amer: 14 mL/min — ABNORMAL LOW (ref >60)
Glucose, Bld: 146 mg/dL — ABNORMAL HIGH (ref 70–99)
Potassium: 3.9 mmol/L (ref 3.5–5.1)
Sodium: 139 mmol/L (ref 135–145)

## 2019-08-13 LAB — GLUCOSE, CAPILLARY
Glucose-Capillary: 134 mg/dL — ABNORMAL HIGH (ref 70–99)
Glucose-Capillary: 135 mg/dL — ABNORMAL HIGH (ref 70–99)
Glucose-Capillary: 125 mg/dL — ABNORMAL HIGH (ref 70–99)

## 2019-08-13 LAB — TYPE AND SCREEN
ABO/RH(D): AB POS
Antibody Screen: NEGATIVE
Unit division: 0
Unit division: 0
Unit division: 0
Unit division: 0

## 2019-08-13 LAB — PREPARE RBC (CROSSMATCH)

## 2019-08-13 LAB — MAGNESIUM: Magnesium: 2.5 mg/dL — ABNORMAL HIGH (ref 1.7–2.4)

## 2019-08-13 SURGERY — A/V SHUNT INTERVENTION
Anesthesia: Moderate Sedation | Laterality: Left

## 2019-08-13 SURGERY — UPPER EXTREMITY ANGIOGRAPHY
Anesthesia: Moderate Sedation | Laterality: Left

## 2019-08-13 MED ORDER — SODIUM CHLORIDE 0.9 % IV SOLN
INTRAVENOUS | Status: DC
  Administered 2019-08-13: 12:00:00 via INTRAVENOUS

## 2019-08-13 MED ORDER — FAMOTIDINE 20 MG PO TABS: 40.0000 mg | ORAL_TABLET | Freq: Once | ORAL | Status: DC | PRN

## 2019-08-13 MED ORDER — HEPARIN SODIUM (PORCINE) 1000 UNIT/ML IJ SOLN
INTRAMUSCULAR | Status: AC
  Filled 2019-08-13: qty 1

## 2019-08-13 MED ORDER — FENTANYL CITRATE (PF) 100 MCG/2ML IJ SOLN
INTRAMUSCULAR | Status: AC
  Filled 2019-08-13: qty 2

## 2019-08-13 MED ORDER — HEPARIN SODIUM (PORCINE) 1000 UNIT/ML IJ SOLN
INTRAMUSCULAR | Status: DC | PRN
  Administered 2019-08-13: 3000 [IU] via INTRAVENOUS

## 2019-08-13 MED ORDER — FENTANYL CITRATE (PF) 100 MCG/2ML IJ SOLN
INTRAMUSCULAR | Status: DC | PRN
  Administered 2019-08-13 (×2): 50 ug via INTRAVENOUS

## 2019-08-13 MED ORDER — MIDAZOLAM HCL 2 MG/2ML IJ SOLN
INTRAMUSCULAR | Status: DC | PRN
  Administered 2019-08-13: 2 mg via INTRAVENOUS
  Administered 2019-08-13: 1 mg via INTRAVENOUS

## 2019-08-13 MED ORDER — HYDROMORPHONE HCL 1 MG/ML IJ SOLN: 1.0000 mg | Freq: Once | INTRAMUSCULAR | Status: DC | PRN

## 2019-08-13 MED ORDER — METHYLPREDNISOLONE SODIUM SUCC 125 MG IJ SOLR: 125.0000 mg | Freq: Once | INTRAMUSCULAR | Status: DC | PRN

## 2019-08-13 MED ORDER — MIDAZOLAM HCL 5 MG/5ML IJ SOLN
INTRAMUSCULAR | Status: AC
  Filled 2019-08-13: qty 5

## 2019-08-13 MED ORDER — DIPHENHYDRAMINE HCL 50 MG/ML IJ SOLN: 50.0000 mg | Freq: Once | INTRAMUSCULAR | Status: DC | PRN

## 2019-08-13 MED ORDER — POTASSIUM CHLORIDE 20 MEQ PO PACK
20.0000 meq | PACK | Freq: Once | ORAL | Status: AC
  Administered 2019-08-13: 22:00:00 20 meq via ORAL
  Filled 2019-08-13: qty 1

## 2019-08-13 MED ORDER — MIDAZOLAM HCL 2 MG/ML PO SYRP
8.0000 mg | ORAL_SOLUTION | Freq: Once | ORAL | Status: DC | PRN
  Filled 2019-08-13: qty 4

## 2019-08-13 MED ORDER — CARVEDILOL 25 MG PO TABS
25.0000 mg | ORAL_TABLET | Freq: Two times a day (BID) | ORAL | Status: DC
  Administered 2019-08-14 – 2019-08-17 (×6): 25 mg via ORAL
  Filled 2019-08-13 (×6): qty 1

## 2019-08-13 MED ORDER — ONDANSETRON HCL 4 MG/2ML IJ SOLN: 4.0000 mg | Freq: Four times a day (QID) | INTRAMUSCULAR | Status: DC | PRN

## 2019-08-13 MED ORDER — APIXABAN 2.5 MG PO TABS
2.5000 mg | ORAL_TABLET | Freq: Two times a day (BID) | ORAL | Status: DC
  Administered 2019-08-14 – 2019-08-17 (×6): 2.5 mg via ORAL
  Filled 2019-08-13 (×7): qty 1

## 2019-08-13 SURGICAL SUPPLY — 19 items
BALLN STERLING OTW 5X80X135 (BALLOONS) ×3
BALLOON STERLING OTW 5X80X135 (BALLOONS) ×1 IMPLANT
CATH ANGIO 5F 100CM .035 PIG (CATHETERS) ×3 IMPLANT
CATH BEACON 5 .035 100 H1 TIP (CATHETERS) ×3 IMPLANT
CATH VERT 5FR 125CM (CATHETERS) ×3 IMPLANT
DEVICE PRESTO INFLATION (MISCELLANEOUS) ×6 IMPLANT
DEVICE STARCLOSE SE CLOSURE (Vascular Products) ×3 IMPLANT
DEVICE TORQUE .025-.038 (MISCELLANEOUS) ×3 IMPLANT
GLIDEWIRE ANGLED SS 035X260CM (WIRE) ×3 IMPLANT
PACK ANGIOGRAPHY (CUSTOM PROCEDURE TRAY) ×3 IMPLANT
SHEATH BRITE TIP 5FRX11 (SHEATH) ×3 IMPLANT
SHEATH SHUTTLE SELECT 6F (SHEATH) ×3 IMPLANT
STENT VIABAHN 6X7.5X120 (Permanent Stent) ×3 IMPLANT
SYR MEDRAD MARK 7 150ML (SYRINGE) ×3 IMPLANT
TUBING CONTRAST HIGH PRESS 72 (TUBING) ×6 IMPLANT
VALVE CHECKFLO PERFORMER (SHEATH) ×3 IMPLANT
WIRE G V18X300CM (WIRE) ×3 IMPLANT
WIRE J 3MM .035X145CM (WIRE) ×3 IMPLANT
WIRE MAGIC TORQUE 260C (WIRE) ×3 IMPLANT

## 2019-08-13 NOTE — Progress Notes (Signed)
Post HD Tx note Pt tolerated well the Tx. Prescribed UF goal of 3L was met. Pt had Tx for 3.5hrs as prescribed. Pt received 2 L of O2 and SPO2 100 throughout the tx. HR 80-90. BP post Tx 155/83. Temp 98. CVC is heparin locked closed and clamped   08/13/19 1900  Hand-Off documentation  Report given to (Full Name) Amy Neomia Dear RN   Report received from (Full Name) Newt Minion RN  Vital Signs  Temp 98 F (36.7 C)  Temp Source Oral  Pulse Rate 97  Resp 18  BP (!) 155/83  Oxygen Therapy  SpO2 100 %  O2 Device Nasal Cannula  O2 Flow Rate (L/min) 2 L/min  Pain Assessment  Pain Scale 0-10  Pain Score 0  Post-Hemodialysis Assessment  Rinseback Volume (mL) 250 mL  KECN 76.1 V  Dialyzer Clearance Lightly streaked  Duration of HD Treatment -hour(s) 3.5 hour(s)  Hemodialysis Intake (mL) 500 mL  UF Total -Machine (mL) 3000 mL  Net UF (mL) 2500 mL  Tolerated HD Treatment Yes  Post-Hemodialysis Comments  (PT tolerated well the TX)  Hemodialysis Catheter Right Internal jugular Double lumen Temporary (Non-Tunneled)  No Placement Date or Time found.   Placed prior to admission: Yes  Orientation: Right  Access Location: Internal jugular  Hemodialysis Catheter Type: Double lumen Temporary (Non-Tunneled)  Site Condition No complications  Blue Lumen Status Heparin locked  Red Lumen Status Heparin locked  Purple Lumen Status N/A  Catheter fill solution Heparin 1000 units/ml  Catheter fill volume (Arterial) 2 cc  Catheter fill volume (Venous) 2.2  Dressing Type Biopatch;Occlusive  Dressing Status Clean;Dry;Intact  Interventions Dressing reinforced  Drainage Description None  Dressing Change Due 08/18/19  Post treatment catheter status Capped and Clamped

## 2019-08-13 NOTE — Consult Note (Signed)
[place] for Electrolyte Monitoring and Replacement   Recent Labs: Potassium (mmol/L)  Date Value  08/13/2019 3.9   Magnesium (mg/dL)  Date Value  08/13/2019 2.5 (H)   Calcium (mg/dL)  Date Value  08/13/2019 8.5 (L)   Albumin (g/dL)  Date Value  08/11/2019 2.9 (L)   Phosphorus (mg/dL)  Date Value  08/11/2019 4.4   Sodium (mmol/L)  Date Value  08/13/2019 139   Corrected Ca: 9.68 mg/dL  Assessment: 43 y.o. male admitted on 08/03/2019 with sepsis. On 10/14, pt became suddenly altered and then unresponsive with agonal respirations, leading to respiratory arrest with bradycardia and eventual PEA arrest requiring approximately 10 minutes of CPR/ACLS before ROSC was achieved. Pt was intubated during arrest, but has since been extubated.  Patient receives dialysis TThSat.  Patient will receive hemodialysis today 10/22.  Goal of Therapy:  Due to cardiac history: Potassium 4.0 - 5.1 mmol/L Magnesium 2.0 - 2.4 mg/dL Other electrolytes WNL  Plan:  KCl 3.9, goal ~4.  Will repeat KCl 20 mEq x 1 dose, as patient is going to HD today and K wasting is expected. Mag 2.5, slightly supratherapeutic.  Will hold supplementation and check tomorrow. Follow mag and electrolytes in the AM.  Gerald Dexter, PharmD Pharmacy Resident  08/13/2019 8:14 AM

## 2019-08-13 NOTE — Progress Notes (Signed)
HD Tx completed   08/13/19 1855  Vital Signs  Pulse Rate 98  Resp 17  BP (!) 155/88  Oxygen Therapy  SpO2 97 %  O2 Device Nasal Cannula  O2 Flow Rate (L/min) 2 L/min  Pain Assessment  Pain Scale 0-10  Pain Score 0  During Hemodialysis Assessment  Blood Flow Rate (mL/min) 400 mL/min  Arterial Pressure (mmHg) -170 mmHg  Venous Pressure (mmHg) 210 mmHg  Transmembrane Pressure (mmHg) 70 mmHg  Ultrafiltration Rate (mL/min) 860 mL/min  Dialysate Flow Rate (mL/min) 600 ml/min  Conductivity: Machine  14  HD Safety Checks Performed Yes  Intra-Hemodialysis Comments Tx completed

## 2019-08-13 NOTE — Progress Notes (Signed)
Pre Hd assessment   08/13/19 1515  Neurological  Level of Consciousness Responds to Voice  Respiratory  Respiratory Pattern Regular;Unlabored  Chest Assessment Chest expansion symmetrical  Bilateral Breath Sounds Clear;Diminished  Cardiac  Pulse Regular  Heart Sounds S1, S2  Jugular Venous Distention (JVD) No  ECG Monitor Yes  Cardiac Rhythm NSR  Antiarrhythmic device No  Vascular  R Radial Pulse +2  L Radial Pulse +2  Integumentary  Integumentary (WDL) X  Skin Color Appropriate for ethnicity  Skin Condition Dry  Skin Integrity Abrasion  Cracking Location Arm  Cracking Location Orientation Left  Musculoskeletal  Musculoskeletal (WDL) X  Generalized Weakness Yes  Gastrointestinal  Bowel Sounds Assessment Active  GU Assessment  Genitourinary (WDL) X  Genitourinary Symptoms Oliguria  Psychosocial  Psychosocial (WDL) X  Patient Behaviors  (UTA Pt is asleep)

## 2019-08-13 NOTE — Op Note (Signed)
OPERATIVE REPORT   PREOPERATIVE DIAGNOSIS: 1. End-stage renal disease. 2.  Pseudoaneurysm in left brachial artery from disruption of previous left arm dialysis access  POSTOPERATIVE DIAGNOSIS: Same as above  PROCEDURE PERFORMED: 1. Ultrasound guidance vascular access to right femoral artery. 2. Catheter placement to left brachial artery  from right femoral approach. 3. Thoracic aortogram and selective left upper extremity angiogram 4.  Covered stent placement left brachial artery with a 6 mm diameter by 7.5 cm length Viabahn stent to exclude the pseudoaneurysm 5. StarClose closure device right femoral artery.  SURGEON: Algernon Huxley, MD  ANESTHESIA: Local with moderate conscious sedation for 25 minutes using 4 mg of Versed and 100 mcg of Fentanyl  BLOOD LOSS: Minimal.  FLUOROSCOPY TIME:5.5 minutes  INDICATION FOR PROCEDURE: This is a 43 y.o.male who presented with a pseudoaneurysm on the left brachial artery from disruption of his previous dialysis access on that side.  To further evaluate this to determine what options would be possible to treat endovascularly, angiogram of the left upper extremity is indicated. Risks and benefits are discussed. Informed consent was obtained.  DESCRIPTION OF PROCEDURE: The patient was brought to the vascular suite. Moderate conscious sedation was administered during a face to face encounter with the patient throughout the procedure with my supervision of the RN administering medicines and monitoring the patient's vital signs, pulse oximetry, telemetry and mental status throughout from the start of the procedure until the patient was taken to the recovery room.  Groins were shaved and prepped and sterile surgical field was created. The right femoral head was localized with fluoroscopy and the right femoral artery was then visualized with ultrasound and found to be widely patent. It was then accessed under direct ultrasound  guidance without difficulty with a Seldinger needle and a permanent image was recorded. A J-wire and 5-French sheath were then placed. Pigtail catheter was placed into the ascending aorta and a thoracic aortogram was then performed in the LAO projection. This demonstrated normal origins to the great vessels without significant proximal stenoses and a normal configuration of the great vessels. The patient was given 3000 units of intravenous heparin and a headhunter catheter was used to selectively cannulate the left subclavian artery without difficulty. This was then sequentially advanced to the brachial artery.  Angiogram demonstrated no significant stenosis in the left subclavian, axillary, or proximal to mid brachial artery.  In the distal left brachial artery about 3 to 4 cm above the antecubital fossa, there was a moderate to large pseudoaneurysm from disruption of the previous dialysis access on that side.  There was a small but reasonably normal radial and ulnar takeoff following this although distal flow was very sluggish from a combination of flow at the pseudoaneurysm and likely poor ejection fraction.  I then exchanged for a long 6 French sheath that was parked in the mid brachial artery.  I then exchanged for 0.018 wire after advancing a Kumpe catheter across the pseudoaneurysm and down into the ulnar artery confirming intraluminal flow.  The diagnostic catheter was removed.  A 6 mm diameter by 7.5 cm length Viabahn covered stent was then deployed encompassing the pseudoaneurysm and having 2 to 3 cm of seal both proximally and distally.  Care was taken to avoid the brachial bifurcation and injuring the distal flow.  This was postdilated with a 5 mm balloon.  Completion imaging showed no significant residual stenosis and no flow into the pseudoaneurysm.  There was some spasm in the radial and ulnar arteries  distally, but they were intact. The diagnostic catheter was removed. Oblique  arteriogram was performed of the right femoral artery and StarClose closure device deployed in the usual fashion with excellent hemostatic result. The patient tolerated the procedure well and was taken to the recovery room in stable condition.   Leotis Pain 08/13/2019 2:10 PM

## 2019-08-13 NOTE — Progress Notes (Signed)
[facility]  ROUNDING NOTE   Subjective:   AVG placed yesterday   Angiogram for today.   Hemodialysis for afterwards.   Objective:  Vital signs in last 24 hours:  Temp:  [97.5 F (36.4 C)-98.2 F (36.8 C)] 98 F (36.7 C) (10/22 0732) Pulse Rate:  [79-90] 88 (10/22 0732) Resp:  [15-27] 18 (10/22 0732) BP: (122-176)/(81-107) 144/92 (10/22 0732) SpO2:  [91 %-100 %] 96 % (10/22 0732) Weight:  [44.6 kg] 44.6 kg (10/22 0544)  Weight change: -2.066 kg Filed Weights   08/11/19 1233 08/12/19 0338 08/13/19 0544  Weight: 45.5 kg 44.6 kg 44.6 kg    Intake/Output: I/O last 3 completed shifts: In: 706.4 [I.V.:236.3; IV Piggyback:470.1] Out: 100 [Urine:50; Blood:50]   Intake/Output this shift:  Total I/O In: -  Out: 150 [Urine:150]  Physical Exam: General: No acute distress  Head: [place]/AT    Eyes: Anicteric  Neck: Supple, trachea midline  Lungs:  Clear to auscultation, normal effort  Heart: regular  Abdomen:  Soft, nontender, bowel sounds present  Extremities: No peripheral edema.  Neurologic: Awake, alert, follows commands  Skin: No lesions  Access: LUE AVG - anuerysm, Right IJ PC,        Basic Metabolic Panel: Recent Labs  Lab 08/06/19 1124  08/08/19 0347 08/09/19 0438 08/10/19 0454 08/11/19 0538 08/12/19 0459 08/13/19 0627  NA  --    < > 140 139 137 137 141 139  K  --    < > 4.7 3.4* 3.7 3.5 3.7 3.9  CL  --    < > 104 100 99 101 102 99  CO2  --    < > 25 27 24 23 25 22   GLUCOSE  --    < > 132* 127* 138* 115* 132* 146*  BUN  --    < > 33* 20 35* 46* 29* 42*  CREATININE  --    < > 4.01* 2.65* 3.85* 4.67* 3.44* 4.65*  CALCIUM  --    < > 8.9 8.6* 8.5* 8.6* 8.6* 8.5*  MG  --    < > 2.8* 1.9  --  2.1 1.9 2.5*  PHOS 9.1*  --   --  3.8  --  4.4  --   --    < > = values in this interval not displayed.    Liver Function Tests: Recent Labs  Lab 08/11/19 0538  ALBUMIN 2.9*   No results for input(s): LIPASE, AMYLASE in the last 168 hours. No  results for input(s): AMMONIA in the last 168 hours.  CBC: Recent Labs  Lab 08/06/19 1958  08/09/19 0438 08/10/19 0454 08/11/19 0538 08/12/19 0459 08/13/19 0627  WBC 8.2   < > 11.7* 9.5 11.0* 8.3 9.7  NEUTROABS 7.0  --   --   --   --   --   --   HGB 7.4*   < > 8.1* 8.5* 8.9* 9.1* 8.8*  HCT 22.8*   < > 25.3* 26.8* 27.9* 28.9* 27.9*  MCV 97.9   < > 101.2* 100.4* 101.1* 101.4* 102.6*  PLT 124*   < > 140* 161 186 205 162   < > = values in this interval not displayed.    Cardiac Enzymes: No results for input(s): CKTOTAL, CKMB, CKMBINDEX, TROPONINI in the last 168 hours.  BNP: Invalid input(s): POCBNP  CBG: Recent Labs  Lab 08/12/19 1050 08/12/19 1632 08/12/19 2338 08/13/19 0622 08/13/19 0818  GLUCAP 103* 121* 169* 134* 135*    Microbiology:  Results for orders placed or performed during the hospital encounter of 08/03/19  SARS Coronavirus 2 by RT PCR (hospital order, performed in [name] hospital lab) Nasopharyngeal Nasopharyngeal Swab     Status: None   Collection Time: 08/03/19  9:25 AM   Specimen: Nasopharyngeal Swab  Result Value Ref Range Status   SARS Coronavirus 2 NEGATIVE NEGATIVE Final    Comment: (NOTE) If result is NEGATIVE SARS-CoV-2 target nucleic acids are NOT DETECTED. The SARS-CoV-2 RNA is generally detectable in upper and lower  respiratory specimens during the acute phase of infection. The lowest  concentration of SARS-CoV-2 viral copies this assay can detect is 250  copies / mL. A negative result does not preclude SARS-CoV-2 infection  and should not be used as the sole basis for treatment or other  patient management decisions.  A negative result may occur with  improper specimen collection / handling, submission of specimen other  than nasopharyngeal swab, presence of viral mutation(s) within the  areas targeted by this assay, and inadequate number of viral copies  (<250 copies / mL). A negative result must be combined with clinical   observations, patient history, and epidemiological information. If result is POSITIVE SARS-CoV-2 target nucleic acids are DETECTED. The SARS-CoV-2 RNA is generally detectable in upper and lower  respiratory specimens dur ing the acute phase of infection.  Positive  results are indicative of active infection with SARS-CoV-2.  Clinical  correlation with patient history and other diagnostic information is  necessary to determine patient infection status.  Positive results do  not rule out bacterial infection or co-infection with other viruses. If result is PRESUMPTIVE POSTIVE SARS-CoV-2 nucleic acids MAY BE PRESENT.   A presumptive positive result was obtained on the submitted specimen  and confirmed on repeat testing.  While 2019 novel coronavirus  (SARS-CoV-2) nucleic acids may be present in the submitted sample  additional confirmatory testing may be necessary for epidemiological  and / or clinical management purposes  to differentiate between  SARS-CoV-2 and other Sarbecovirus currently known to infect humans.  If clinically indicated additional testing with an alternate test  methodology [phone]) is advised. The SARS-CoV-2 RNA is generally  detectable in upper and lower respiratory sp ecimens during the acute  phase of infection. The expected result is Negative. Fact Sheet for Patients:  StrictlyIdeas.no Fact Sheet for Healthcare Providers: BankingDealers.co.za This test is not yet approved or cleared by the [place] FDA and has been authorized for detection and/or diagnosis of SARS-CoV-2 by FDA under an Emergency Use Authorization (EUA).  This EUA will remain in effect (meaning this test can be used) for the duration of the COVID-19 declaration under Section 564(b)(1) of the Act, 21 U.S.C. section 360bbb-3(b)(1), unless the authorization is terminated or revoked sooner. Performed at [facility], [address]  [place], [place] [address]   Blood culture (routine x 2)     Status: None   Collection Time: 08/03/19 10:05 AM   Specimen: BLOOD  Result Value Ref Range Status   Specimen Description BLOOD BLOOD RIGHT FOREARM  Final   Special Requests   Final    BOTTLES DRAWN AEROBIC AND ANAEROBIC Blood Culture adequate volume   Culture   Final    NO GROWTH 5 DAYS Performed at [facility], [address], [place], [place] [address]    Report Status 08/08/2019 FINAL  Final  MRSA PCR Screening     Status: None   Collection Time: 08/04/19  7:56 AM  Specimen: Nasopharyngeal  Result Value Ref Range Status   MRSA by PCR NEGATIVE NEGATIVE Final    Comment:        The GeneXpert MRSA Assay (FDA approved for NASAL specimens only), is one component of a comprehensive MRSA colonization surveillance program. It is not intended to diagnose MRSA infection nor to guide or monitor treatment for MRSA infections. Performed at [facility], [address], [place], [place] [address]   Urine Culture     Status: None   Collection Time: 08/05/19 10:14 AM   Specimen: Urine, Random  Result Value Ref Range Status   Specimen Description   Final    URINE, RANDOM Performed at [facility], [address], [place], [place] [address]    Special Requests   Final    NONE Performed at [facility], [address] [place], [place] [address]    Culture   Final    NO GROWTH Performed at [facility], [place] [address] [place], [place] [address]    Report Status 08/06/2019 FINAL  Final  Culture, respiratory (non-expectorated)     Status: None   Collection Time: 08/05/19  8:40 PM   Specimen: Tracheal Aspirate; Respiratory  Result Value Ref Range Status   Specimen Description   Final    TRACHEAL ASPIRATE Performed at [facility], [address] [place], [place] [address]    Special Requests   Final    Normal Performed at [facility], [address] [place], [place] [address]    Gram Stain   Final    FEW WBC PRESENT,BOTH PMN AND MONONUCLEAR NO ORGANISMS SEEN    Culture   Final    RARE Consistent with normal respiratory flora. Performed at [facility], [place] [address], [place], [place] [address]    Report Status 08/08/2019 FINAL  Final    Coagulation Studies: Recent Labs    08/12/19 0459  LABPROT 17.6*  INR 1.5*    Urinalysis: No results for input(s): COLORURINE, LABSPEC, PHURINE, GLUCOSEU, HGBUR, BILIRUBINUR, KETONESUR, PROTEINUR, UROBILINOGEN, NITRITE, LEUKOCYTESUR in the last 72 hours.  Invalid input(s): APPERANCEUR    Imaging: Dg Chest Port 1 View  Result Date: 08/12/2019 CLINICAL DATA:  Dialysis catheter placement. End-stage renal disease. EXAM: PORTABLE CHEST 1 VIEW COMPARISON:  08/10/2019 FINDINGS: Right jugular dual-lumen central venous dialysis catheter is seen with leads overlying the distal SVC and right atrium. No evidence of pneumothorax. Stable cardiomegaly. Stable moderate left pleural effusion and left lower lung atelectasis versus infiltrate. Right lung is clear. Intravascular stents seen in the left axilla and upper arm. IMPRESSION: Right jugular dual-lumen central venous catheter in appropriate position. No pneumothorax visualized. No significant change in moderate left pleural effusion, and left lower lung atelectasis versus infiltrate. Stable cardiomegaly. Electronically Signed   By: Marlaine Hind M.D.   On: 08/12/2019 19:54     Medications:   . sodium chloride 10 mL/hr at 08/13/19 0235  . levETIRAcetam 500 mg (08/13/19 0848)   . aspirin  81 mg Per Tube Daily  . Chlorhexidine Gluconate Cloth  6 each Topical Q0600  . epoetin (EPOGEN/PROCRIT) injection  10,000 Units Intravenous Q T,Th,Sa-HD  . feeding supplement (NEPRO CARB STEADY)  237 mL Oral BID BM  . hydrocortisone sod succinate (SOLU-CORTEF) inj  100 mg Intravenous Q12H  . insulin aspart  0-9 Units Subcutaneous Q6H  . multivitamin  1  tablet Oral QHS  . pantoprazole  20 mg Oral Daily  .  potassium chloride  20 mEq Oral Once  . sodium chloride flush  10-40 mL Intracatheter Q12H   sodium chloride, acetaminophen, albuterol, labetalol, nitroGLYCERIN, ondansetron (ZOFRAN) IV, promethazine, sodium chloride flush, zolpidem  Assessment/ Plan:  43 y.o.white male with end-stage renal disease on hemodialysis, hypertension, peripheral vascular disease, depression.  [place] TTS  Left AVG  1.  ESRD on HD TTS.  - Continue TTS schedule - with complication of dialysis device - anuerysmal AVG - Vascular for revision for today  2.  Acute respiratory failure/PEA arrest.  V/Q was low probablility - Appreciate cardiology and neurology input - Started on Keppra for possible seizure.   3.  Anemia of chronic kidney disease: hemoglobin 8.8 - EPO with HD treatment  4.  Secondary hyperparathyroidism: not currently on binders.   5. Hypertension:  - IV labetalol PRN   LOS: 9 Rage Beever 10/22/202011:11 AM

## 2019-08-13 NOTE — Plan of Care (Signed)
  Problem: Clinical Measurements: Goal: Ability to maintain clinical measurements within normal limits will improve Outcome: Progressing Goal: Will remain free from infection Outcome: Progressing Goal: Diagnostic test results will improve Outcome: Progressing Goal: Respiratory complications will improve Outcome: Progressing Goal: Cardiovascular complication will be avoided Outcome: Progressing  Pt arrived from specials (graft angiogram) to HD Tx Acute Room to receive routine HD Tx. Pt is asleep however, easily awakened.  BP WDL to receive HD Tx.  Tx started will keep monitoring

## 2019-08-13 NOTE — Progress Notes (Signed)
Patient ID: Brett Small, male   DOB: [birth year], 43 y.o.   MRN: [id number]  [employer] PROGRESS NOTE  Brett Small [id number] DOB: 14-Nov-1975 DOA: 08/03/2019 PCP: Patient, [name]  HPI/Subjective: Patient seen and evaluated today Tolerating diet well Was extubated on August 07, 2019 Had a AV graft placement yesterday On dialysis Decrease shortness of breath  Objective: Vitals:   08/13/19 0544 08/13/19 0732  BP: (!) 153/95 (!) 144/92  Pulse: 84 88  Resp: 16 18  Temp: 97.8 F (36.6 C) 98 F (36.7 C)  SpO2: 93% 96%    Intake/Output Summary (Last 24 hours) at 08/13/2019 1036 Last data filed at 08/13/2019 0925 Gross per 24 hour  Intake 706.37 ml  Output 200 ml  Net 506.37 ml   Filed Weights   08/11/19 1233 08/12/19 0338 08/13/19 0544  Weight: 45.5 kg 44.6 kg 44.6 kg    ROS: Review of Systems  Constitutional: Negative for chills, diaphoresis and fever.  HENT: Negative for ear discharge, hearing loss and nosebleeds.   Eyes: Negative for blurred vision and pain.  Respiratory: Negative for cough, hemoptysis and shortness of breath.   Cardiovascular: Negative for palpitations and orthopnea.  Gastrointestinal: Negative for abdominal pain, diarrhea, nausea and vomiting.  Skin: Negative for itching and rash.  Neurological: Negative for dizziness, tingling, speech change and weakness.  Psychiatric/Behavioral: Negative for depression. The patient is not nervous/anxious.    Exam: Physical Exam  HENT:  Mouth/Throat: No oropharyngeal exudate.  Unable to look on the mouth  Eyes: Pupils are equal, round, and reactive to light. Conjunctivae and lids are normal.  Neck: Trachea normal. Carotid bruit is not present.  Cardiovascular: Regular rhythm, S1 normal, S2 normal and normal heart sounds.  Respiratory: He has decreased breath sounds in the right lower field and the left lower field. He has no wheezes. He has rhonchi in the right lower field and the left lower  field. He has no rales.  GI: Soft. Bowel sounds are normal. There is no abdominal tenderness.  Musculoskeletal:     Right ankle: He exhibits no swelling.     Left ankle: He exhibits no swelling.  Neurological: He is alert.  Follows commands and able to move all extremities.  Skin: Skin is warm. No rash noted. Nails show no clubbing.  Psychiatric: He has a normal mood and affect.      Data Reviewed: Basic Metabolic Panel: Recent Labs  Lab 08/06/19 1124  08/08/19 0347 08/09/19 0438 08/10/19 0454 08/11/19 0538 08/12/19 0459 08/13/19 0627  NA  --    < > 140 139 137 137 141 139  K  --    < > 4.7 3.4* 3.7 3.5 3.7 3.9  CL  --    < > 104 100 99 101 102 99  CO2  --    < > 25 27 24 23 25 22   GLUCOSE  --    < > 132* 127* 138* 115* 132* 146*  BUN  --    < > 33* 20 35* 46* 29* 42*  CREATININE  --    < > 4.01* 2.65* 3.85* 4.67* 3.44* 4.65*  CALCIUM  --    < > 8.9 8.6* 8.5* 8.6* 8.6* 8.5*  MG  --    < > 2.8* 1.9  --  2.1 1.9 2.5*  PHOS 9.1*  --   --  3.8  --  4.4  --   --    < > = values in this interval not  displayed.   Liver Function Tests: Recent Labs  Lab 08/11/19 0538  ALBUMIN 2.9*   CBC: Recent Labs  Lab 08/06/19 1958  08/09/19 0438 08/10/19 0454 08/11/19 0538 08/12/19 0459 08/13/19 0627  WBC 8.2   < > 11.7* 9.5 11.0* 8.3 9.7  NEUTROABS 7.0  --   --   --   --   --   --   HGB 7.4*   < > 8.1* 8.5* 8.9* 9.1* 8.8*  HCT 22.8*   < > 25.3* 26.8* 27.9* 28.9* 27.9*  MCV 97.9   < > 101.2* 100.4* 101.1* 101.4* 102.6*  PLT 124*   < > 140* 161 186 205 162   < > = values in this interval not displayed.   BNP (last 3 results) Recent Labs    07/20/19 1029 08/03/19 0925 08/05/19 0431  BNP >4,500.0* >4,500.0* 3,772.0*     CBG: Recent Labs  Lab 08/12/19 1050 08/12/19 1632 08/12/19 2338 08/13/19 0622 08/13/19 0818  GLUCAP 103* 121* 169* 134* 135*    Recent Results (from the past 240 hour(s))  MRSA PCR Screening     Status: None   Collection Time: 08/04/19  7:56 AM    Specimen: Nasopharyngeal  Result Value Ref Range Status   MRSA by PCR NEGATIVE NEGATIVE Final    Comment:        The GeneXpert MRSA Assay (FDA approved for NASAL specimens only), is one component of a comprehensive MRSA colonization surveillance program. It is not intended to diagnose MRSA infection nor to guide or monitor treatment for MRSA infections. Performed at [facility], [address], [place], [place] [address]   Urine Culture     Status: None   Collection Time: 08/05/19 10:14 AM   Specimen: Urine, Random  Result Value Ref Range Status   Specimen Description   Final    URINE, RANDOM Performed at [facility], [address] [place], [place] [address]    Special Requests   Final    NONE Performed at [facility], [address], [place], [place] [address]    Culture   Final    NO GROWTH Performed at [facility], [place] [address] [place], [place] [address]    Report Status 08/06/2019 FINAL  Final  Culture, respiratory (non-expectorated)     Status: None   Collection Time: 08/05/19  8:40 PM   Specimen: Tracheal Aspirate; Respiratory  Result Value Ref Range Status   Specimen Description   Final    TRACHEAL ASPIRATE Performed at [facility], [address] [place], [place] [address]    Special Requests   Final    Normal Performed at [facility], [address] [place], [place] [address]    Gram Stain   Final    FEW WBC PRESENT,BOTH PMN AND MONONUCLEAR NO ORGANISMS SEEN    Culture   Final    RARE Consistent with normal respiratory flora. Performed at [facility], [place] [address], [place],  [address]    Report Status 08/08/2019 FINAL  Final     Studies: Dg Chest Port 1 View  Result Date: 08/12/2019 CLINICAL DATA:  Dialysis catheter placement. End-stage renal disease. EXAM: PORTABLE CHEST 1 VIEW COMPARISON:  08/10/2019 FINDINGS: Right jugular dual-lumen central venous dialysis  catheter is seen with leads overlying the distal SVC and right atrium. No evidence of pneumothorax. Stable cardiomegaly. Stable moderate left pleural effusion and left lower lung atelectasis versus infiltrate. Right lung is  clear. Intravascular stents seen in the left axilla and upper arm. IMPRESSION: Right jugular dual-lumen central venous catheter in appropriate position. No pneumothorax visualized. No significant change in moderate left pleural effusion, and left lower lung atelectasis versus infiltrate. Stable cardiomegaly. Electronically Signed   By: Marlaine Hind M.D.   On: 08/12/2019 19:54    Scheduled Meds: . aspirin  81 mg Per Tube Daily  . Chlorhexidine Gluconate Cloth  6 each Topical Q0600  . epoetin (EPOGEN/PROCRIT) injection  10,000 Units Intravenous Q T,Th,Sa-HD  . feeding supplement (NEPRO CARB STEADY)  237 mL Oral BID BM  . hydrocortisone sod succinate (SOLU-CORTEF) inj  100 mg Intravenous Q12H  . insulin aspart  0-9 Units Subcutaneous Q6H  . multivitamin  1 tablet Oral QHS  . pantoprazole  20 mg Oral Daily  . potassium chloride  20 mEq Oral Once  . sodium chloride flush  10-40 mL Intracatheter Q12H   Continuous Infusions: . sodium chloride 10 mL/hr at 08/13/19 0235  . levETIRAcetam 500 mg (08/13/19 0848)    Assessment/Plan:  1. Status post PEA arrest.  Patient had return of spontaneous circulation following ACLS.   VQ scan ruled out pulmonary embolism, is probably from hypotension following hemodialysis. patient is a DNR.  2. Acute hypoxic and hypercapnic respiratory failure.  Patient extubated 08/07/2019 and placed on nasal cannula..  Off cefepime antibiotic.  VQ scan negative for pulmonary embolism 3. Acute on chronic systolic congestive heart failure with EF less than 20.  Dialysis to manage fluid volume.  Nephrology is following 4. End-stage renal disease on hemodialysis.  Tuesday Thursday Saturday schedule.  Hemodialysis scheduled for tomorrow 5. Possible seizure on  Keppra.  Continue Keppra 500 twice daily.  EEG with diffuse slowing without any sharps.  Neurology recommended reimaging of the brain in a few days.  Patient following commands. 6. 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   Right upper arm brachial artery to axillary vein arteriovenous graft with 6 mm Propaten pTFE graft done yesterday.  Angiogram today. 7. History of PAD- vascular surgery follow-up  Code Status: DNR    Code Status Orders  (From admission, onward)         Start     Ordered   08/05/19 0653  Do not attempt resuscitation (DNR)  Continuous    Question Answer Comment  In the event of cardiac or respiratory ARREST Do not call a "code blue"   In the event of cardiac or respiratory ARREST Do not perform Intubation, CPR, defibrillation or ACLS   In the event of cardiac or respiratory ARREST Use medication by any route, position, wound care, and other measures to relive pain and suffering. May use oxygen, suction and manual treatment of airway obstruction as needed for comfort.      08/05/19 [id number]        Code Status History    Date Active Date Inactive Code Status Order ID Comments User Context   08/05/2019 0613 08/05/2019 0652 Full Code [id number]  [name], NP Inpatient   08/04/2019 0127 08/05/2019 0613 Full Code [id number]  Mansy, Arvella Merles, MD ED   07/27/2019 1110 07/29/2019 1719 Full Code [id number]  Hillary Bow, MD ED   07/20/2019 2041 07/23/2019 1929 DNR [id number]  Vaughan Basta, MD Inpatient   07/09/2019 0543 07/12/2019 1502 Full Code [id number]  Harrie Foreman, MD Inpatient   07/02/2019 2128 07/06/2019 1557 Full Code [id number]  Henreitta Leber, MD Inpatient   05/19/2019 0603 05/22/2019 1957 Full Code [id number]  Jannifer Franklin,  Shanon Brow, MD Inpatient   02/28/2019 1805 03/03/2019 2234 Full Code [id number]  Vaughan Basta, MD Inpatient   02/21/2019 0835 02/24/2019 1945 Full Code [id number]  Harrie Foreman, MD Inpatient   02/04/2019 1343 02/09/2019 2050 Full Code  [id number]  Saundra Shelling, MD ED   01/27/2019 0654 01/29/2019 2049 Partial Code [id number]  Harrie Foreman, MD Inpatient   01/07/2019 2156 01/27/2019 0638 Partial Code [id number]  Tennis Ship, MD Inpatient   12/25/2018 0158 01/07/2019 2027 Partial Code [id number]  Ina Homes, MD ED   Advance Care Planning Activity     Family Communication: As per critical care specialist Disposition Plan: To be determined  Consultants:  Critical care specialist  Cardiology  Nephrology  Antibiotics:  Cefepime  Time spent: 35 minutes.  Case discussed with pt and nursing staff.  Saundra Shelling  [employer]

## 2019-08-13 NOTE — H&P (Signed)
[facility] History & Physical Update  The patient was interviewed and re-examined.  The patient's previous History and Physical has been reviewed and is unchanged.  There is no change in the plan of care. We plan to proceed with the scheduled procedure.  Leotis Pain, MD  08/13/2019, 1:20 PM

## 2019-08-13 NOTE — Progress Notes (Signed)
Post HD Tx assessment   08/13/19 1850  Neurological  Level of Consciousness Alert  Orientation Level Oriented to person;Oriented to place;Oriented to situation  Cardiac  Pulse Regular  Heart Sounds S1, S2  Jugular Venous Distention (JVD) No  ECG Monitor Yes  Cardiac Rhythm NSR  Antiarrhythmic device No  Vascular  R Radial Pulse +2  L Radial Pulse +2  Integumentary  Integumentary (WDL) X  Skin Color Appropriate for ethnicity  Skin Condition Dry  Skin Integrity Abrasion  Cracking Location Arm  Cracking Location Orientation Left  Musculoskeletal  Musculoskeletal (WDL) X  Generalized Weakness Yes  Gastrointestinal  Bowel Sounds Assessment Active  GU Assessment  Genitourinary (WDL) X  Genitourinary Symptoms Oliguria  Psychosocial  Psychosocial (WDL) WDL

## 2019-08-13 NOTE — Progress Notes (Signed)
Pre HD Tx assessment   08/13/19 1310  Neurological  Level of Consciousness Alert  Orientation Level Oriented to person;Oriented to place;Oriented to situation  Respiratory  Respiratory Pattern Regular;Unlabored  Chest Assessment Chest expansion symmetrical  Bilateral Breath Sounds Clear;Diminished  R Upper  Breath Sounds Clear  L Upper Breath Sounds Clear  R Lower Breath Sounds Clear;Diminished  L Lower Breath Sounds Clear;Diminished  Cough None  Cardiac  Pulse Regular  Heart Sounds S1, S2  Jugular Venous Distention (JVD) No  ECG Monitor Yes  Cardiac Rhythm NSR  Antiarrhythmic device No  Vascular  R Radial Pulse +2  L Radial Pulse +2  Integumentary  Integumentary (WDL) X  Skin Condition Dry  Skin Integrity Surgical Incision (see LDA);Amputation;Cracking;Ecchymosis  Ecchymosis Location Arm  Ecchymosis Location Orientation Left  Musculoskeletal  Musculoskeletal (WDL) X  Generalized Weakness Yes  Gastrointestinal  Bowel Sounds Assessment Active  GU Assessment  Genitourinary (WDL) X  Genitourinary Symptoms Oliguria  Psychosocial  Psychosocial (WDL) X  Patient Behaviors  (UTA Pt is Asleep)

## 2019-08-13 NOTE — Progress Notes (Signed)
Pre HD Tx Note Pt arrived from specials to HD Acute Room for HD Tx. Pt is asleep however easily awakened. Pt is currently reciving 2L of O2 per [place]. SPO2 100. HR 80 BP 142/97. 2K2.5Ca for 3.5hrs. Pt is ready to start HD Tx.   08/13/19 1510  Hand-Off documentation  Report given to (Full Name) Newt Minion RN   Report received from (Full Name) Amy and Claiborne Billings RN   Vital Signs  Temp (!) 96.6 F (35.9 C)  Temp Source Axillary  Pulse Rate 81  Pulse Rate Source Monitor  Resp 16  BP (!) 143/91  BP Location Right Leg  BP Method Automatic  Patient Position (if appropriate) Lying  Oxygen Therapy  SpO2 100 %  O2 Device Nasal Cannula  O2 Flow Rate (L/min) 2 L/min  Pain Assessment  Pain Scale 0-10  Pain Score Asleep  Time-Out for Hemodialysis  What Procedure? HD   Pt Identifiers(min of two) First/Last Name;MRN/Account#  Correct Site? Yes  Correct Side? Yes  Correct Procedure? Yes  Consents Verified? Yes  Safety Precautions Reviewed? Yes  Engineer, civil (consulting) Number 3  Station Number 1  UF/Alarm Test Passed  Conductivity: Meter 13.8  Conductivity: Machine  14.1  pH 7.4  Reverse Osmosis Main   Normal Saline Lot Number [id number]  Dialyzer Lot Number [id number]  Disposable Set Lot Number [id number]  Machine Temperature 98.8 F (37.1 C)  Musician and Audible Yes  Blood Lines Intact and Secured Yes  Pre Treatment Patient Checks  Vascular access used during treatment Catheter  HD catheter dressing before treatment WDL  Patient is receiving dialysis in a chair  (In Bed)  Hepatitis B Surface Antigen Results Negative  Date Hepatitis B Surface Antigen Drawn 08/03/19  Hepatitis B Surface Antibody  (<10)  Date Hepatitis B Surface Antibody Drawn 08/03/19  Hemodialysis Consent Verified Yes  Hemodialysis Standing Orders Initiated Yes  ECG (Telemetry) Monitor On Yes  Prime Ordered Normal Saline  Length of  DialysisTreatment -hour(s) 3.5 Hour(s)  Dialysis Treatment Comments  (Na140)   Dialyzer Elisio 17H NR  Dialysate 2K;2.5 Ca  Dialysis Anticoagulant None  Dialysate Flow Ordered 600  Blood Flow Rate Ordered 400 mL/min  Ultrafiltration Goal 2.5 Liters  Dialysis Blood Pressure Support Ordered Albumin  Hemodialysis Catheter Right Internal jugular Double lumen Temporary (Non-Tunneled)  No Placement Date or Time found.   Placed prior to admission: Yes  Orientation: Right  Access Location: Internal jugular  Hemodialysis Catheter Type: Double lumen Temporary (Non-Tunneled)  Site Condition No complications  Blue Lumen Status Flushed;Blood return noted  Red Lumen Status Flushed;Blood return noted  Purple Lumen Status N/A  Dressing Type Biopatch;Occlusive  Dressing Status Clean;Dry;Intact  Drainage Description None  Dressing Change Due 08/18/19

## 2019-08-13 NOTE — Progress Notes (Signed)
HD Tx Started    08/13/19 1515  Vital Signs  Pulse Rate 80  Resp 11  BP (!) 145/95  Oxygen Therapy  SpO2 100 %  O2 Device Nasal Cannula  O2 Flow Rate (L/min) 2 L/min  Pain Assessment  Pain Scale 0-10  Pain Score Asleep  During Hemodialysis Assessment  Blood Flow Rate (mL/min) 400 mL/min  Arterial Pressure (mmHg) -140 mmHg  Venous Pressure (mmHg) 150 mmHg  Transmembrane Pressure (mmHg) 60 mmHg  Ultrafiltration Rate (mL/min) 860 mL/min  Dialysate Flow Rate (mL/min) 600 ml/min  Conductivity: Machine  13.7  HD Safety Checks Performed Yes  Dialysis Fluid Bolus Normal Saline  Bolus Amount (mL) 250 mL  Intra-Hemodialysis Comments Tx initiated

## 2019-08-14 ENCOUNTER — Encounter: Payer: Self-pay | Admitting: Anesthesiology

## 2019-08-14 ENCOUNTER — Encounter
Admission: EM | Disposition: A | Discharge status: Home Health | Payer: Self-pay | Source: Home / Self Care | Attending: Internal Medicine

## 2019-08-14 DIAGNOSIS — L7682 Other postprocedural complications of skin and subcutaneous tissue: Secondary | ICD-10-CM | POA: Diagnosis not present

## 2019-08-14 LAB — GLUCOSE, CAPILLARY
Glucose-Capillary: 109 mg/dL — ABNORMAL HIGH (ref 70–99)
Glucose-Capillary: 151 mg/dL — ABNORMAL HIGH (ref 70–99)
Glucose-Capillary: 157 mg/dL — ABNORMAL HIGH (ref 70–99)
Glucose-Capillary: 158 mg/dL — ABNORMAL HIGH (ref 70–99)
Glucose-Capillary: 191 mg/dL — ABNORMAL HIGH (ref 70–99)

## 2019-08-14 LAB — BASIC METABOLIC PANEL
Anion gap: 13 (ref 5–15)
BUN: 19 mg/dL (ref 6–20)
CO2: 26 mmol/L (ref 22–32)
Calcium: 8.6 mg/dL — ABNORMAL LOW (ref 8.9–10.3)
Chloride: 100 mmol/L (ref 98–111)
Creatinine, Ser: 2.68 mg/dL — ABNORMAL HIGH (ref 0.61–1.24)
GFR calc Af Amer: 32 mL/min — ABNORMAL LOW (ref >60)
GFR calc non Af Amer: 27 mL/min — ABNORMAL LOW (ref >60)
Glucose, Bld: 149 mg/dL — ABNORMAL HIGH (ref 70–99)
Potassium: 3.6 mmol/L (ref 3.5–5.1)
Sodium: 139 mmol/L (ref 135–145)

## 2019-08-14 LAB — MAGNESIUM: Magnesium: 2 mg/dL (ref 1.7–2.4)

## 2019-08-14 SURGERY — IRRIGATION AND DEBRIDEMENT EXTREMITY
Anesthesia: Choice | Laterality: Right

## 2019-08-14 MED ORDER — VANCOMYCIN HCL IN DEXTROSE 1-5 GM/200ML-% IV SOLN
1000.0000 mg | INTRAVENOUS | Status: AC
  Filled 2019-08-14: qty 200

## 2019-08-14 MED ORDER — INSULIN ASPART 100 UNIT/ML ~~LOC~~ SOLN
0.0000 [IU] | Freq: Three times a day (TID) | SUBCUTANEOUS | Status: DC
  Administered 2019-08-14 (×2): 2 [IU] via SUBCUTANEOUS
  Administered 2019-08-15 – 2019-08-16 (×2): 1 [IU] via SUBCUTANEOUS
  Filled 2019-08-14 (×4): qty 1

## 2019-08-14 MED ORDER — INSULIN ASPART 100 UNIT/ML ~~LOC~~ SOLN: 0.0000 [IU] | Freq: Every day | SUBCUTANEOUS | Status: DC

## 2019-08-14 MED ORDER — POTASSIUM CHLORIDE 20 MEQ PO PACK
40.0000 meq | PACK | Freq: Once | ORAL | Status: AC
  Administered 2019-08-14: 40 meq via ORAL
  Filled 2019-08-14: qty 2

## 2019-08-14 SURGICAL SUPPLY — 21 items
BNDG CONFORM 2 STRL LF (GAUZE/BANDAGES/DRESSINGS) ×3 IMPLANT
BRUSH SCRUB EZ  4% CHG (MISCELLANEOUS) ×2
BRUSH SCRUB EZ 4% CHG (MISCELLANEOUS) ×1 IMPLANT
CANISTER SUCT 1200ML W/VALVE (MISCELLANEOUS) ×3 IMPLANT
CANISTER WOUND CARE 500ML ATS (WOUND CARE) ×3 IMPLANT
COVER WAND RF STERILE (DRAPES) ×3 IMPLANT
DURAPREP 26ML APPLICATOR (WOUND CARE) ×3 IMPLANT
ELECT REM PT RETURN 9FT ADLT (ELECTROSURGICAL) ×3
ELECTRODE REM PT RTRN 9FT ADLT (ELECTROSURGICAL) ×1 IMPLANT
GAUZE SPONGE 4X4 12PLY STRL (GAUZE/BANDAGES/DRESSINGS) ×3 IMPLANT
GLOVE SURG SYN 8.0 (GLOVE) ×3 IMPLANT
GOWN STRL REUS W/ TWL LRG LVL3 (GOWN DISPOSABLE) ×2 IMPLANT
GOWN STRL REUS W/ TWL XL LVL3 (GOWN DISPOSABLE) ×1 IMPLANT
GOWN STRL REUS W/TWL LRG LVL3 (GOWN DISPOSABLE) ×4
GOWN STRL REUS W/TWL XL LVL3 (GOWN DISPOSABLE) ×2
KIT TURNOVER KIT A (KITS) ×3 IMPLANT
LABEL OR SOLS (LABEL) ×3 IMPLANT
NS IRRIG 500ML POUR BTL (IV SOLUTION) ×3 IMPLANT
PACK EXTREMITY ARMC (MISCELLANEOUS) ×3 IMPLANT
PAD NEG PRESSURE SENSATRAC (MISCELLANEOUS) ×3 IMPLANT
STOCKINETTE STRL 4IN 9604848 (GAUZE/BANDAGES/DRESSINGS) ×3 IMPLANT

## 2019-08-14 NOTE — Care Management Important Message (Signed)
Important Message  Patient Details  Name: Brett Small MRN: [id number] Date of Birth: 1975-11-23   Medicare Important Message Given:  Yes     Dannette Barbara 08/14/2019, 11:24 AM

## 2019-08-14 NOTE — Progress Notes (Signed)
Patient ID: Brett Small, male   DOB: [birth year], 43 y.o.   MRN: [id number]  [facility] [employer] PROGRESS NOTE  Brett Small [id number] DOB: July 24, 1976 DOA: 08/03/2019 PCP: Patient, [name]  HPI/Subjective: Patient seen and evaluated today Tolerating diet well Was extubated on August 07, 2019 Had a AV graft placement during this hospitalization On dialysis Shortness of breath improved Wound dehiscence noted to the right AC incision, plan by vascular surgery to take patient back to the OR for washout and closure this a.m.  Objective: Vitals:   08/14/19 0332 08/14/19 0733  BP: (!) 154/95 135/66  Pulse: 84 87  Resp: 18 19  Temp: 98.4 F (36.9 C) 97.8 F (36.6 C)  SpO2: 98% 99%    Intake/Output Summary (Last 24 hours) at 08/14/2019 1146 Last data filed at 08/14/2019 1011 Gross per 24 hour  Intake 130 ml  Output 2500 ml  Net -2370 ml   Filed Weights   08/11/19 1233 08/12/19 0338 08/13/19 0544  Weight: 45.5 kg 44.6 kg 44.6 kg    ROS: Review of Systems  Constitutional: Negative for chills, diaphoresis and fever.  HENT: Negative for ear discharge, hearing loss and nosebleeds.   Eyes: Negative for blurred vision and pain.  Respiratory: Negative for cough, hemoptysis and shortness of breath.   Cardiovascular: Negative for palpitations and orthopnea.  Gastrointestinal: Negative for abdominal pain, diarrhea, nausea and vomiting.  Skin: Negative for itching and rash.  Neurological: Negative for dizziness, tingling, speech change and weakness.  Psychiatric/Behavioral: Negative for depression. The patient is not nervous/anxious.    Exam: Physical Exam  HENT:  Mouth/Throat: No oropharyngeal exudate.  Unable to look on the mouth  Eyes: Pupils are equal, round, and reactive to light. Conjunctivae and lids are normal.  Neck: Trachea normal. Carotid bruit is not present.  Cardiovascular: Regular rhythm, S1 normal, S2 normal and normal heart sounds.  Respiratory: He has  decreased breath sounds in the right lower field and the left lower field. He has no wheezes. He has rhonchi in the right lower field and the left lower field. He has no rales.  GI: Soft. Bowel sounds are normal. There is no abdominal tenderness.  Musculoskeletal:     Right ankle: He exhibits no swelling.     Left ankle: He exhibits no swelling.  Neurological: He is alert.  Follows commands and able to move all extremities.  Skin: Skin is warm. No rash noted. Nails show no clubbing.  Psychiatric: He has a normal mood and affect.      Data Reviewed: Basic Metabolic Panel: Recent Labs  Lab 08/09/19 0438 08/10/19 0454 08/11/19 0538 08/12/19 0459 08/13/19 0627 08/14/19 0630  NA 139 137 137 141 139 139  K 3.4* 3.7 3.5 3.7 3.9 3.6  CL 100 99 101 102 99 100  CO2 27 24 23 25 22 26   GLUCOSE 127* 138* 115* 132* 146* 149*  BUN 20 35* 46* 29* 42* 19  CREATININE 2.65* 3.85* 4.67* 3.44* 4.65* 2.68*  CALCIUM 8.6* 8.5* 8.6* 8.6* 8.5* 8.6*  MG 1.9  --  2.1 1.9 2.5* 2.0  PHOS 3.8  --  4.4  --   --   --    Liver Function Tests: Recent Labs  Lab 08/11/19 0538  ALBUMIN 2.9*   CBC: Recent Labs  Lab 08/09/19 0438 08/10/19 0454 08/11/19 0538 08/12/19 0459 08/13/19 0627  WBC 11.7* 9.5 11.0* 8.3 9.7  HGB 8.1* 8.5* 8.9* 9.1* 8.8*  HCT 25.3* 26.8* 27.9* 28.9* 27.9*  MCV  101.2* 100.4* 101.1* 101.4* 102.6*  PLT 140* 161 186 205 162   BNP (last 3 results) Recent Labs    07/20/19 1029 08/03/19 0925 08/05/19 0431  BNP >4,500.0* >4,500.0* 3,772.0*     CBG: Recent Labs  Lab 08/13/19 0818 08/13/19 1231 08/14/19 0003 08/14/19 0555 08/14/19 1139  GLUCAP 135* 125* 109* 157* 191*    Recent Results (from the past 240 hour(s))  Urine Culture     Status: None   Collection Time: 08/05/19 10:14 AM   Specimen: Urine, Random  Result Value Ref Range Status   Specimen Description   Final    URINE, RANDOM Performed at [facility], [address], [place], [place]  [address]    Special Requests   Final    NONE Performed at [facility], [address], [place], [place] [address]    Culture   Final    NO GROWTH Performed at [facility], [place] [address] [place], [place] [address]    Report Status 08/06/2019 FINAL  Final  Culture, respiratory (non-expectorated)     Status: None   Collection Time: 08/05/19  8:40 PM   Specimen: Tracheal Aspirate; Respiratory  Result Value Ref Range Status   Specimen Description   Final    TRACHEAL ASPIRATE Performed at [facility], [address] [place], [place] [address]    Special Requests   Final    Normal Performed at [facility], [address] [place], [place] [address]    Gram Stain   Final    FEW WBC PRESENT,BOTH PMN AND MONONUCLEAR NO ORGANISMS SEEN    Culture   Final    RARE Consistent with normal respiratory flora. Performed at [facility], [place] [address], [place], [place] [address]    Report Status 08/08/2019 FINAL  Final     Studies: Dg Chest Port 1 View  Result Date: 08/12/2019 CLINICAL DATA:  Dialysis catheter placement. End-stage renal disease. EXAM: PORTABLE CHEST 1 VIEW COMPARISON:  08/10/2019 FINDINGS: Right jugular dual-lumen central venous dialysis catheter is seen with leads overlying the distal SVC and right atrium. No evidence of pneumothorax. Stable cardiomegaly. Stable moderate left pleural effusion and left lower lung atelectasis versus infiltrate. Right lung is clear. Intravascular stents seen in the left axilla and upper arm. IMPRESSION: Right jugular dual-lumen central venous catheter in appropriate position. No pneumothorax visualized. No significant change in moderate left pleural effusion, and left lower lung atelectasis versus infiltrate. Stable cardiomegaly. Electronically Signed   By: Marlaine Hind M.D.   On: 08/12/2019 19:54    Scheduled Meds: . apixaban  2.5 mg Oral BID  . aspirin  81 mg Per Tube Daily  . carvedilol  25 mg  Oral BID WC  . Chlorhexidine Gluconate Cloth  6 each Topical Q0600  . epoetin (EPOGEN/PROCRIT) injection  10,000 Units Intravenous Q T,Th,Sa-HD  . feeding supplement (NEPRO CARB STEADY)  237 mL Oral BID BM  . hydrocortisone sod succinate (SOLU-CORTEF) inj  100 mg Intravenous Q12H  . insulin aspart  0-5 Units Subcutaneous QHS  . insulin aspart  0-9 Units Subcutaneous TID WC  . multivitamin  1 tablet Oral QHS  . pantoprazole  20 mg Oral Daily  . potassium chloride  40 mEq Oral Once  . sodium chloride flush  10-40 mL Intracatheter Q12H   Continuous Infusions: . sodium chloride 250 mL (08/14/19 0929)  . levETIRAcetam 500 mg (08/14/19 0931)  . vancomycin      Assessment/Plan:  1. Status post PEA arrest.  Patient had return of spontaneous circulation following ACLS.   VQ scan ruled out pulmonary embolism, is probably from hypotension following hemodialysis. patient is a DNR.  2. S/P hypoxic and hypercapnic respiratory failure.  Patient extubated 08/07/2019 and placed on nasal cannula and weaned off oxygen via nasal cannula..  Off cefepime antibiotic.  VQ scan negative for pulmonary embolism. 3. Acute on chronic systolic congestive heart failure with EF less than 20.  Dialysis to manage fluid volume.  Nephrology is following patient closely 4. End-stage renal disease on hemodialysis.  Tuesday Thursday Saturday schedule.  Hemodialysis scheduled for tomorrow 5. Possible seizure on Keppra.  Continue Keppra 500 twice daily.  EEG with diffuse slowing without any sharps.  Neurology recommended reimaging of the brain in a few days.  Patient following commands. 6. 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   Right upper arm brachial artery to axillary vein arteriovenous graft with 6 mm Propaten pTFE graft done thursday.  Wound dehiscence to the right AC incision.  Plan by vascular surgery take patient back to the OR for washout and closure today. 7. History of PAD- vascular surgery follow-up  appreciated  Code Status: DNR    Code Status Orders  (From admission, onward)         Start     Ordered   08/05/19 0653  Do not attempt resuscitation (DNR)  Continuous    Question Answer Comment  In the event of cardiac or respiratory ARREST Do not call a "code blue"   In the event of cardiac or respiratory ARREST Do not perform Intubation, CPR, defibrillation or ACLS   In the event of cardiac or respiratory ARREST Use medication by any route, position, wound care, and other measures to relive pain and suffering. May use oxygen, suction and manual treatment of airway obstruction as needed for comfort.      08/05/19 [id number]        Code Status History    Date Active Date Inactive Code Status Order ID Comments User Context   08/05/2019 0613 08/05/2019 0652 Full Code [id number]  [name], NP Inpatient   08/04/2019 0127 08/05/2019 0613 Full Code [id number]  Mansy, Arvella Merles, MD ED   07/27/2019 1110 07/29/2019 1719 Full Code [id number]  Hillary Bow, MD ED   07/20/2019 2041 07/23/2019 1929 DNR [id number]  Vaughan Basta, MD Inpatient   07/09/2019 0543 07/12/2019 1502 Full Code [id number]  Harrie Foreman, MD Inpatient   07/02/2019 2128 07/06/2019 1557 Full Code [id number]  Henreitta Leber, MD Inpatient   05/19/2019 0603 05/22/2019 1957 Full Code [id number]  Lance Coon, MD Inpatient   02/28/2019 1805 03/03/2019 2234 Full Code [id number]  Vaughan Basta, MD Inpatient   02/21/2019 0835 02/24/2019 1945 Full Code [id number]  Harrie Foreman, MD Inpatient   02/04/2019 1343 02/09/2019 2050 Full Code [id number]  Saundra Shelling, MD ED   01/27/2019 0654 01/29/2019 2049 Partial Code [id number]  Harrie Foreman, MD Inpatient   01/07/2019 2156 01/27/2019 0638 Partial Code [id number]  Tennis Ship, MD Inpatient   12/25/2018 0158 01/07/2019 2027 Partial Code [id number]  Ina Homes, MD ED   Advance Care Planning Activity     Family Communication: As per critical care specialist Disposition Plan: To  be determined  Consultants:  Critical care specialist  Cardiology  Nephrology  Antibiotics:  Cefepime  Time spent: 35 minutes.  Case discussed with pt and nursing staff.  Saundra Shelling  [employer]

## 2019-08-14 NOTE — Progress Notes (Signed)
[facility]  ROUNDING NOTE   Subjective:   Hemodialysis treatment yesterday. Tolerated treatment well.   Wound dehiscence of his right AVG incision.  Ligated AVG on left yesterday by Dr. Lucky Cowboy  Objective:  Vital signs in last 24 hours:  Temp:  [96.6 F (35.9 C)-98.5 F (36.9 C)] 97.8 F (36.6 C) (10/23 0733) Pulse Rate:  [80-98] 87 (10/23 0733) Resp:  [11-21] 19 (10/23 0733) BP: (125-169)/(66-105) 135/66 (10/23 0733) SpO2:  [95 %-100 %] 99 % (10/23 0733)  Weight change:  Filed Weights   08/11/19 1233 08/12/19 0338 08/13/19 0544  Weight: 45.5 kg 44.6 kg 44.6 kg    Intake/Output: I/O last 3 completed shifts: In: 231.3 [I.V.:46.3; IV Piggyback:185] Out: [id number] [Urine:150; Other:2500]   Intake/Output this shift:  Total I/O In: 120 [P.O.:120] Out: -   Physical Exam: General: No acute distress  Head: Angus/AT    Eyes: Anicteric  Neck: Supple, trachea midline  Lungs:  Clear to auscultation, normal effort  Heart: regular  Abdomen:  Soft, nontender, bowel sounds present  Extremities: No peripheral edema.  Neurologic: Awake, alert, follows commands  Skin: No lesions  Access: Right IJ PC, Right AVG with open incision     Basic Metabolic Panel: Recent Labs  Lab 08/09/19 0438 08/10/19 0454 08/11/19 0538 08/12/19 0459 08/13/19 0627 08/14/19 0630  NA 139 137 137 141 139 139  K 3.4* 3.7 3.5 3.7 3.9 3.6  CL 100 99 101 102 99 100  CO2 27 24 23 Brett 22 26   GLUCOSE 127* 138* 115* 132* 146* 149*  BUN 20 35* 46* 29* 42* 19  CREATININE 2.65* 3.85* 4.67* 3.44* 4.65* 2.68*  CALCIUM 8.6* 8.5* 8.6* 8.6* 8.5* 8.6*  MG 1.9  --  2.1 1.9 2.5* 2.0  PHOS 3.8  --  4.4  --   --   --     Liver Function Tests: Recent Labs  Lab 08/11/19 0538  ALBUMIN 2.9*   No results for input(s): LIPASE, AMYLASE in the last 168 hours. No results for input(s): AMMONIA in the last 168 hours.  CBC: Recent Labs  Lab 08/09/19 0438 08/10/19 0454 08/11/19 0538 08/12/19 0459  08/13/19 0627  WBC 11.7* 9.5 11.0* 8.3 9.7  HGB 8.1* 8.5* 8.9* 9.1* 8.8*  HCT Brett.3* 26.8* 27.9* 28.9* 27.9*  MCV 101.2* 100.4* 101.1* 101.4* 102.6*  PLT 140* 161 186 205 162    Cardiac Enzymes: No results for input(s): CKTOTAL, CKMB, CKMBINDEX, TROPONINI in the last 168 hours.  BNP: Invalid input(s): POCBNP  CBG: Recent Labs  Lab 08/13/19 0622 08/13/19 0818 08/13/19 1231 08/14/19 0003 08/14/19 0555  GLUCAP 134* 135* 125* 109* 157*    Microbiology: Results for orders placed or performed during the hospital encounter of 08/03/19  SARS Coronavirus 2 by RT PCR (hospital order, performed in [facility] hospital lab) Nasopharyngeal Nasopharyngeal Swab     Status: None   Collection Time: 08/03/19  9:Brett AM   Specimen: Nasopharyngeal Swab  Result Value Ref Range Status   SARS Coronavirus 2 NEGATIVE NEGATIVE Final    Comment: (NOTE) If result is NEGATIVE SARS-CoV-2 target nucleic acids are NOT DETECTED. The SARS-CoV-2 RNA is generally detectable in upper and lower  respiratory specimens during the acute phase of infection. The lowest  concentration of SARS-CoV-2 viral copies this assay can detect is 250  copies / mL. A negative result does not preclude SARS-CoV-2 infection  and should not be used as the sole basis for treatment or other  patient management decisions.  A negative result may occur with  improper specimen collection / handling, submission of specimen other  than nasopharyngeal swab, presence of viral mutation(s) within the  areas targeted by this assay, and inadequate number of viral copies  (<250 copies / mL). A negative result must be combined with clinical  observations, patient history, and epidemiological information. If result is POSITIVE SARS-CoV-2 target nucleic acids are DETECTED. The SARS-CoV-2 RNA is generally detectable in upper and lower  respiratory specimens dur ing the acute phase of infection.  Positive  results are indicative of active  infection with SARS-CoV-2.  Clinical  correlation with patient history and other diagnostic information is  necessary to determine patient infection status.  Positive results do  not rule out bacterial infection or co-infection with other viruses. If result is PRESUMPTIVE POSTIVE SARS-CoV-2 nucleic acids MAY BE PRESENT.   A presumptive positive result was obtained on the submitted specimen  and confirmed on repeat testing.  While 2019 novel coronavirus  (SARS-CoV-2) nucleic acids may be present in the submitted sample  additional confirmatory testing may be necessary for epidemiological  and / or clinical management purposes  to differentiate between  SARS-CoV-2 and other Sarbecovirus currently known to infect humans.  If clinically indicated additional testing with an alternate test  methodology [phone]) is advised. The SARS-CoV-2 RNA is generally  detectable in upper and lower respiratory sp ecimens during the acute  phase of infection. The expected result is Negative. Fact Sheet for Patients:  StrictlyIdeas.no Fact Sheet for Healthcare Providers: BankingDealers.co.za This test is not yet approved or cleared by the [place] FDA and has been authorized for detection and/or diagnosis of SARS-CoV-2 by FDA under an Emergency Use Authorization (EUA).  This EUA will remain in effect (meaning this test can be used) for the duration of the COVID-19 declaration under Section 564(b)(1) of the Act, 21 U.S.C. section 360bbb-3(b)(1), unless the authorization is terminated or revoked sooner. Performed at [facility], [address] [place], [place] [address]   Blood culture (routine x 2)     Status: None   Collection Time: 08/03/19 10:05 AM   Specimen: BLOOD  Result Value Ref Range Status   Specimen Description BLOOD BLOOD RIGHT FOREARM  Final   Special Requests   Final    BOTTLES DRAWN AEROBIC AND ANAEROBIC Blood Culture  adequate volume   Culture   Final    NO GROWTH 5 DAYS Performed at [facility], [address] [place], [place] [address]    Report Status 08/08/2019 FINAL  Final  MRSA PCR Screening     Status: None   Collection Time: 08/04/19  7:56 AM   Specimen: Nasopharyngeal  Result Value Ref Range Status   MRSA by PCR NEGATIVE NEGATIVE Final    Comment:        The GeneXpert MRSA Assay (FDA approved for NASAL specimens only), is one component of a comprehensive MRSA colonization surveillance program. It is not intended to diagnose MRSA infection nor to guide or monitor treatment for MRSA infections. Performed at [facility], [address] [place], [place] [address]   Urine Culture     Status: None   Collection Time: 08/05/19 10:14 AM   Specimen: Urine, Random  Result Value Ref Range Status   Specimen Description   Final    URINE, RANDOM Performed at [facility], [address] [place], [place] [address]    Special Requests   Final    NONE Performed at [facility], [address], [place], [place] [address]    Culture   Final    NO GROWTH Performed at [facility], [place] [address] [place], [place] [address]    Report Status 08/06/2019 FINAL  Final  Culture, respiratory (non-expectorated)     Status: None   Collection Time: 08/05/19  8:40 PM   Specimen: Tracheal Aspirate; Respiratory  Result Value Ref Range Status   Specimen Description   Final    TRACHEAL ASPIRATE Performed at [facility], [address] [place], [place] [address]    Special Requests   Final    Normal Performed at [facility], [address] [place], [place] [address]    Gram Stain   Final    FEW WBC PRESENT,BOTH PMN AND MONONUCLEAR NO ORGANISMS SEEN    Culture   Final    RARE Consistent with normal respiratory flora. Performed at [facility], [place] [address], [place],  [address]    Report Status 08/08/2019 FINAL   Final    Coagulation Studies: Recent Labs    08/12/19 0459  LABPROT 17.6*  INR 1.5*    Urinalysis: No results for input(s): COLORURINE, LABSPEC, PHURINE, GLUCOSEU, HGBUR, BILIRUBINUR, KETONESUR, PROTEINUR, UROBILINOGEN, NITRITE, LEUKOCYTESUR in the last 72 hours.  Invalid input(s): APPERANCEUR    Imaging: Dg Chest Port 1 View  Result Date: 08/12/2019 CLINICAL DATA:  Dialysis catheter placement. End-stage renal disease. EXAM: PORTABLE CHEST 1 VIEW COMPARISON:  08/10/2019 FINDINGS: Right jugular dual-lumen central venous dialysis catheter is seen with leads overlying the distal SVC and right atrium. No evidence of pneumothorax. Stable cardiomegaly. Stable moderate left pleural effusion and left lower lung atelectasis versus infiltrate. Right lung is clear. Intravascular stents seen in the left axilla and upper arm. IMPRESSION: Right jugular dual-lumen central venous catheter in appropriate position. No pneumothorax visualized. No significant change in moderate left pleural effusion, and left lower lung atelectasis versus infiltrate. Stable cardiomegaly. Electronically Signed   By: Marlaine Hind M.D.   On: 08/12/2019 19:54     Medications:   . sodium chloride 250 mL (08/14/19 0929)  . levETIRAcetam 500 mg (08/14/19 0931)  . vancomycin     . apixaban  2.5 mg Oral BID  . aspirin  81 mg Per Tube Daily  . carvedilol  Brett mg Oral BID WC  . Chlorhexidine Gluconate Cloth  6 each Topical Q0600  . epoetin (EPOGEN/PROCRIT) injection  10,000 Units Intravenous Q T,Th,Sa-HD  . feeding supplement (NEPRO CARB STEADY)  237 mL Oral BID BM  . hydrocortisone sod succinate (SOLU-CORTEF) inj  100 mg Intravenous Q12H  . insulin aspart  0-5 Units Subcutaneous QHS  . insulin aspart  0-9 Units Subcutaneous TID WC  . multivitamin  1 tablet Oral QHS  . pantoprazole  20 mg Oral Daily  . potassium chloride  40 mEq Oral Once  . sodium chloride flush  10-40 mL Intracatheter Q12H   sodium chloride,  acetaminophen, albuterol, HYDROmorphone (DILAUDID) injection, labetalol, nitroGLYCERIN, ondansetron (ZOFRAN) IV, promethazine, sodium chloride flush, zolpidem  Assessment/ Plan:  43 y.o.white Small with end-stage renal disease on hemodialysis, hypertension, peripheral vascular disease, depression.  [place] TTS  Left AVG  1.  ESRD on HD TTS.  - Continue TTS schedule - with complication of dialysis device - appreciate vascular input.   2.  Acute respiratory failure/PEA arrest.  V/Q was low probablility - Appreciate cardiology and neurology input - Started on Keppra for possible seizure.  3.  Anemia of chronic kidney disease:   - EPO with HD treatment  4.  Secondary hyperparathyroidism: not currently on binders.   5. Hypertension:  - carvedilol   LOS: 10 Rosey Eide 10/23/202010:43 AM

## 2019-08-14 NOTE — Progress Notes (Signed)
[facility]   Wound dehiscence to right AC incision. Plan to take back to OR for washout and closure this AM.  Discussed with Dr. Delana Meyer / Ellis Parents Bonna Steury PA-C 08/14/2019 10:26 AM

## 2019-08-14 NOTE — Progress Notes (Signed)
Right AC wound has dehisced.  Appears both sutures and dermabond have failed.  Dr. Lucky Cowboy notified and will come to see the patient.  Wound covered with sterile gauze.

## 2019-08-14 NOTE — Consult Note (Signed)
[facility] for Electrolyte Monitoring and Replacement   Recent Labs: Potassium (mmol/L)  Date Value  08/14/2019 3.6   Magnesium (mg/dL)  Date Value  08/14/2019 2.0   Calcium (mg/dL)  Date Value  08/14/2019 8.6 (L)   Albumin (g/dL)  Date Value  08/11/2019 2.9 (L)   Phosphorus (mg/dL)  Date Value  08/11/2019 4.4   Sodium (mmol/L)  Date Value  08/14/2019 139   Corrected Ca: 9.68 mg/dL  Assessment: 43 y.o. male admitted on 08/03/2019 with sepsis. On 10/14, pt became suddenly altered and then unresponsive with agonal respirations, leading to respiratory arrest with bradycardia and eventual PEA arrest requiring approximately 10 minutes of CPR/ACLS before ROSC was achieved. Pt was intubated during arrest, but has since been extubated.  Patient receives dialysis TThSat.  Patient will receive hemodialysis today 10/22.  Goal of Therapy:  Due to cardiac history: Potassium 4.0 - 5.1 mmol/L Magnesium 2.0 - 2.4 mg/dL Other electrolytes WNL  Plan:  KCl 3.6, goal ~4.  Will give KCl 40 mEq x 1 dose. Mag 2.5 > 2.0, slightly supratherapeutic.  Will hold supplementation as patient was dialyzed 10/22 and check tomorrow. Follow mag and electrolytes in the AM.  Gerald Dexter, PharmD Pharmacy Resident  08/14/2019 9:00 AM

## 2019-08-14 NOTE — Progress Notes (Signed)
[facility] Daily Progress Note   Subjective: 08/13/19: 1. Ultrasound guidance vascular access to right femoral artery. 2. Catheter placement to left brachial artery  from right femoral approach. 3. Thoracic aortogram and selective left upper extremity angiogram 4.  Covered stent placement left brachial artery with a 6 mm diameter by 7.5 cm length Viabahn stent to exclude the pseudoaneurysm 5. StarClose closure device right femoral artery.  08/12/19: Right upper arm brachial artery to axillary vein arteriovenous graft with 6 mm Propaten pTFE graft  Patient with dehiscence of right AC incision this AM.   Objective: Vitals:   08/13/19 2130 08/13/19 2228 08/14/19 0332 08/14/19 0733  BP: (!) 166/100 130/82 (!) 154/95 135/66  Pulse: 92 82 84 87  Resp:   18 19  Temp:   98.4 F (36.9 C) 97.8 F (36.6 C)  TempSrc:   Oral Oral  SpO2:   98% 99%  Weight:      Height:        Intake/Output Summary (Last 24 hours) at 08/14/2019 1441 Last data filed at 08/14/2019 1011 Gross per 24 hour  Intake 130 ml  Output 2500 ml  Net -2370 ml   Physical Exam: A&Ox3, NAD CV: RRR Pulmonary: CTA Bilaterally Abdomen: Soft, Nontender, Nondistended Right Groin:   Access Site: Clean, dry and intact Vascular:  Left Upper Extremity: Extremity soft, warm distally to fingers, dehiscence or AC incision noted - no bleeding, clean and dry. Bruit and thrill in graft. Good capillary refill. Motor / sensory intact.    Laboratory: CBC    Component Value Date/Time   WBC 9.7 08/13/2019 0627   HGB 8.8 (L) 08/13/2019 0627   HCT 27.9 (L) 08/13/2019 0627   PLT 162 08/13/2019 0627   BMET    Component Value Date/Time   NA 139 08/14/2019 0630   K 3.6 08/14/2019 0630   CL 100 08/14/2019 0630   CO2 26 08/14/2019 0630   GLUCOSE 149 (H) 08/14/2019 0630   BUN 19 08/14/2019 0630   CREATININE 2.68 (H) 08/14/2019 0630   CALCIUM 8.6 (L) 08/14/2019 0630   GFRNONAA 27 (L) 08/14/2019 0630    GFRAA 32 (L) 08/14/2019 0630   Assessment/Planning: The patient is a 43 year old male with multiple medical issues including end-stage renal disease on hemodialysis status post right brachial axillary graft creation with antecubital incision wound dehiscence and Viabahn 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  The area was then prepped with ChloraPrep.  2% lidocaine was administered to provide local anesthesia.  3-0 nylon sutures were applied mattress style to approximate the wound.  Xeroform the suture line.  Cover it with sterile gauze.  Covered with Coban.  The patient tolerated procedure well without complication.   Please do not remove the dressing until Monday.  Seen and examined with Dr. Ellis Parents Nathanyl Andujo PA-C 08/14/2019 2:41 PM

## 2019-08-15 LAB — BASIC METABOLIC PANEL
Anion gap: 11 (ref 5–15)
BUN: 34 mg/dL — ABNORMAL HIGH (ref 6–20)
CO2: 27 mmol/L (ref 22–32)
Calcium: 8.7 mg/dL — ABNORMAL LOW (ref 8.9–10.3)
Chloride: 99 mmol/L (ref 98–111)
Creatinine, Ser: 3.91 mg/dL — ABNORMAL HIGH (ref 0.61–1.24)
GFR calc Af Amer: 20 mL/min — ABNORMAL LOW (ref >60)
GFR calc non Af Amer: 17 mL/min — ABNORMAL LOW (ref >60)
Glucose, Bld: 144 mg/dL — ABNORMAL HIGH (ref 70–99)
Potassium: 4 mmol/L (ref 3.5–5.1)
Sodium: 137 mmol/L (ref 135–145)

## 2019-08-15 LAB — MAGNESIUM: Magnesium: 2 mg/dL (ref 1.7–2.4)

## 2019-08-15 LAB — GLUCOSE, CAPILLARY
Glucose-Capillary: 134 mg/dL — ABNORMAL HIGH (ref 70–99)
Glucose-Capillary: 93 mg/dL (ref 70–99)
Glucose-Capillary: 102 mg/dL — ABNORMAL HIGH (ref 70–99)

## 2019-08-15 MED ORDER — LEVETIRACETAM 500 MG PO TABS: 500.0000 mg | ORAL_TABLET | Freq: Two times a day (BID) | ORAL | Status: DC

## 2019-08-15 MED ORDER — HYDROCORTISONE NA SUCCINATE PF 100 MG IJ SOLR
50.0000 mg | Freq: Two times a day (BID) | INTRAMUSCULAR | Status: DC
Start: 2019-08-15 — End: 2019-08-15
  Filled 2019-08-15: qty 1

## 2019-08-15 MED ORDER — LEVETIRACETAM 500 MG PO TABS
500.0000 mg | ORAL_TABLET | Freq: Two times a day (BID) | ORAL | Status: DC
  Administered 2019-08-15 – 2019-08-17 (×4): 500 mg via ORAL
  Filled 2019-08-15 (×5): qty 1

## 2019-08-15 MED ORDER — RENA-VITE PO TABS: 1.0000 | ORAL_TABLET | Freq: Every day | ORAL | Status: DC

## 2019-08-15 MED ORDER — PREDNISONE 50 MG PO TABS
50.0000 mg | ORAL_TABLET | Freq: Every day | ORAL | Status: DC
  Administered 2019-08-16: 09:00:00 50 mg via ORAL
  Filled 2019-08-15: qty 1

## 2019-08-15 MED ORDER — SEVELAMER CARBONATE 800 MG PO TABS
1600.0000 mg | ORAL_TABLET | Freq: Three times a day (TID) | ORAL | Status: DC
  Administered 2019-08-15 – 2019-08-17 (×6): 1600 mg via ORAL
  Filled 2019-08-15 (×6): qty 2

## 2019-08-15 MED ORDER — HYDRALAZINE HCL 25 MG PO TABS
25.0000 mg | ORAL_TABLET | Freq: Three times a day (TID) | ORAL | Status: DC
  Administered 2019-08-15 – 2019-08-16 (×4): 25 mg via ORAL
  Filled 2019-08-15 (×4): qty 1

## 2019-08-15 NOTE — Progress Notes (Addendum)
Triple lume Groin line removed.  Site held for 5 minutes.  Covered with a gauze and transparent dressing.  No bleeding at the site. Dr. Posey Pronto says we don't need to maintain an IV at this time.

## 2019-08-15 NOTE — Progress Notes (Signed)
[place] at [place] NAME: Brett Small    MR#:  [id number]  DATE OF BIRTH:  03-29-1976  SUBJECTIVE:   Patient overall doing well. He has been a bit noncompliant with using the bathroom without nurses being notified he has a femoral catheter REVIEW OF SYSTEMS:   Review of Systems  Constitutional: Negative for chills, fever and weight loss.  HENT: Negative for ear discharge, ear pain and nosebleeds.   Eyes: Negative for blurred vision, pain and discharge.  Respiratory: Negative for sputum production, shortness of breath, wheezing and stridor.   Cardiovascular: Negative for chest pain, palpitations, orthopnea and PND.  Gastrointestinal: Negative for abdominal pain, diarrhea, nausea and vomiting.  Genitourinary: Negative for frequency and urgency.  Musculoskeletal: Negative for back pain and joint pain.  Neurological: Negative for sensory change, speech change, focal weakness and weakness.  Psychiatric/Behavioral: Negative for depression and hallucinations. The patient is not nervous/anxious.    Tolerating Diet: yes Tolerating PT:   DRUG ALLERGIES:   Allergies  Allergen Reactions  . Codeine Nausea Only    Patient questioned this (??)  . Sulfa Antibiotics Hives and Nausea And Vomiting    VITALS:  Blood pressure 134/83, pulse 84, temperature 98.1 F (36.7 C), temperature source Oral, resp. rate 16, height 5\' 1"  (1.549 m), weight 44.7 kg, SpO2 92 %.  PHYSICAL EXAMINATION:   Physical Exam  GENERAL:  43 y.o.-year-old patient lying in the bed with no acute distress.  EYES: Pupils equal, round, reactive to light and accommodation. No scleral icterus. Extraocular muscles intact.  HEENT: Head atraumatic, normocephalic. Oropharynx and nasopharynx clear.  NECK:  Supple, no jugular venous distention. No thyroid enlargement, no tenderness.  LUNGS: Normal breath sounds bilaterally, no wheezing, rales, rhonchi. No use of accessory  muscles of respiration.  CARDIOVASCULAR: S1, S2 normal. No murmurs, rubs, or gallops.  ABDOMEN: Soft, nontender, nondistended. Bowel sounds present. No organomegaly or mass.  EXTREMITIES: No cyanosis, clubbing or edema b/l.    NEUROLOGIC: Cranial nerves II through XII are intact. No focal Motor or sensory deficits b/l.   PSYCHIATRIC:  patient is alert and oriented x 3.  SKIN: No obvious rash, lesion, or ulcer.   LABORATORY PANEL:  CBC Recent Labs  Lab 08/13/19 0627  WBC 9.7  HGB 8.8*  HCT 27.9*  PLT 162    Chemistries  Recent Labs  Lab 08/15/19 0545  NA 137  K 4.0  CL 99  CO2 27  GLUCOSE 144*  BUN 34*  CREATININE 3.91*  CALCIUM 8.7*  MG 2.0   Cardiac Enzymes No results for input(s): TROPONINI in the last 168 hours. RADIOLOGY:  No results found. ASSESSMENT AND PLAN:  1. Status post PEA arrest.  Patient had return of spontaneous circulation following ACLS.   VQ scan ruled out pulmonary embolism, is probably from hypotension following hemodialysis. patient is a DNR.  -Patient will need cardiology to follow-up given his EF less than 20% for possible AICD placement down the road -he has seen Dr. Neoma Laming in September. I will consult him. -Need to taper him off his  Solu-Medrol that was given during cardiac arrest.  2. S/P hypoxic and hypercapnic respiratory failure.  Patient extubated 08/07/2019 and placed on nasal cannula and weaned off oxygen via nasal cannula..  Off cefepime antibiotic.  VQ scan negative for pulmonary embolism.  3. Acute on chronic systolic congestive heart failure with EF less than 20.  Dialysis to manage fluid volume.  Nephrology is  following patient closely -continue Coreg, hydralazine  4.End-stage renal disease on hemodialysis.  Tuesday Thursday Saturday schedule.    5 .Possible seizure on Keppra.  Continue Keppra 500 twice daily.  EEG with diffuse slowing without any sharps.  Neurology recommended reimaging of the brain in a few days.  Patient  following commands.  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   Right upper arm brachial artery to axillary vein arteriovenous graft with 6 mm Propaten pTFE graft done thursday.  Wound dehiscence to the right AC incision.   -s/p  vascular surgery take patient back to the OR for washout and closure on 10/23  7.History of PAD- vascular surgery follow-up appreciated--on po eliquis  8. DVT prophylaxis on PO eliquis  Care management for discharge planning. Poor long-term prognosis   CODE STATUS: DNR  DVT Prophylaxis: eliquis  TOTAL TIME TAKING CARE OF THIS PATIENT: *30* minutes.  >50% time spent on counselling and coordination of care  POSSIBLE D/C IN 2-3** DAYS, DEPENDING ON CLINICAL CONDITION.  Note: This dictation was prepared with Dragon dictation along with smaller phrase technology. Any transcriptional errors that result from this process are unintentional.  Fritzi Mandes M.D on 08/15/2019 at 1:31 PM  Between 7am to 6pm - Pager - [phone]  After 6pm go to [email or web address] - password [email or web address]  [facility]  Office  [phone]  CC: Primary care physician; Patient, No Pcp PerPatient ID: Moataz Kerwin, male   DOB: Mar 22, 1976, 43 y.o.   MRN: [id number]

## 2019-08-15 NOTE — Progress Notes (Signed)
Brett Small  MRN: [id number]  DOB/AGE: [birth year] 43 y.o.  Primary Care Physician:Patient, [name]  Admit date: 08/03/2019  Chief Complaint:  Chief Complaint  Patient presents with  . Respiratory Distress    S-Pt presented on  08/03/2019 with  Chief Complaint  Patient presents with  . Respiratory Distress  .    Pt today feels better.  Patient seen on hemodialysis patient tolerating treatment well  Meds  . apixaban  2.5 mg Oral BID  . aspirin  81 mg Per Tube Daily  . carvedilol  25 mg Oral BID WC  . Chlorhexidine Gluconate Cloth  6 each Topical Q0600  . epoetin (EPOGEN/PROCRIT) injection  10,000 Units Intravenous Q T,Th,Sa-HD  . feeding supplement (NEPRO CARB STEADY)  237 mL Oral BID BM  . hydrALAZINE  25 mg Oral Q8H  . insulin aspart  0-5 Units Subcutaneous QHS  . insulin aspart  0-9 Units Subcutaneous TID WC  . levETIRAcetam  500 mg Oral BID  . multivitamin  1 tablet Oral QHS  . pantoprazole  20 mg Oral Daily  . [START ON 08/16/2019] predniSONE  50 mg Oral Q breakfast  . sevelamer carbonate  1,600 mg Oral TID WC  . sodium chloride flush  10-40 mL Intracatheter Q12H         [id number] from the symptoms mentioned above,there are no other symptoms referable to all systems reviewed.  Physical Exam: Vital signs in last 24 hours: Temp:  [97.8 F (36.6 C)-98.4 F (36.9 C)] 97.8 F (36.6 C) (10/24 0736) Pulse Rate:  [81-91] 85 (10/24 0736) Resp:  [14-19] 18 (10/24 0736) BP: (124-168)/(76-99) 168/76 (10/24 0736) SpO2:  [92 %-100 %] 100 % (10/24 0736) Weight:  [44.7 kg] 44.7 kg (10/24 0425) Weight change:  Last BM Date: 08/12/19  Intake/Output from previous day: 10/23 0701 - 10/24 0700 In: 360 [P.O.:360] Out: -  Total I/O In: -  Out: 100 [Urine:100]   Physical Exam: General- pt is awake,alert, oriented to time place and person Resp- No acute REsp distress, CTA B/L NO Rhonchi CVS- S1S2 regular ij rate and rhythm GIT- BS+, soft, NT, ND EXT- NO LE  Edema, Cyanosis Access tunneled cath being used today Right AV graft placed during this admission Right arm and Ace bandage secondary to recent wound dehiscence Left AV graft in situ but cannot be used  Lab Results: CBC Recent Labs    08/13/19 0627  WBC 9.7  HGB 8.8*  HCT 27.9*  PLT 162    BMET Recent Labs    08/14/19 0630 08/15/19 0545  NA 139 137  K 3.6 4.0  CL 100 99  CO2 26 27  GLUCOSE 149* 144*  BUN 19 34*  CREATININE 2.68* 3.91*  CALCIUM 8.6* 8.7*    MICRO Recent Results (from the past 240 hour(s))  Culture, respiratory (non-expectorated)     Status: None   Collection Time: 08/05/19  8:40 PM   Specimen: Tracheal Aspirate; Respiratory  Result Value Ref Range Status   Specimen Description   Final    TRACHEAL ASPIRATE Performed at [facility], [address] [place], [place] [address]    Special Requests   Final    Normal Performed at [facility], [address] [place],  [address]    Gram Stain   Final    FEW WBC PRESENT,BOTH PMN AND MONONUCLEAR NO ORGANISMS SEEN    Culture   Final    RARE Consistent with normal respiratory flora. Performed at [facility], [place] [address], [place], [place] [address]    Report Status 08/08/2019 FINAL  Final      Lab Results  Component Value Date   PTH 336 (H) 02/04/2019   CALCIUM 8.7 (L) 08/15/2019   CAION 0.91 (L) 12/25/2018   PHOS 4.4 08/11/2019               Impression:   43 y.o.white male with end-stage renal disease on hemodialysis, hypertension, peripheral vascular disease, depression.    1)Renal ESRD               Patient is on hemodialysis               Patient is on Tuesday Thursday Saturday schedule               Patient is being dialyzed today  2)HTN  Medication- On Alpha and beta Blockers  On Vasodilators-   3)Anemia of chronic disease  HGb just below the  goal (9--11) We will keep on Epogen during dialysis  4) secondary  hyperparathyroidism CKD Mineral-Bone Disorder  Secondary Hyperparathyroidism  present  Phosphorus not at goal. On binders   5)Acute respiratory failure/PEA arrest.  V/Q was low probablility - Appreciate cardiology and neurology input - Started on Keppra for possible seizure.   6) electrolytes Normokalemic NOrmonatremic   7)Acid base Co2 at goal  8) complication of dialysis device   Pt had a AV graft placement during this hospitalization   Wound dehiscence was noted to the right AC incision,patient was taken back to the OR by the vascular surgery for washout and closure yesterday  Plan:  Will continue current care    Howell Groesbeck s [place] 08/15/2019, 11:14 AM

## 2019-08-15 NOTE — Consult Note (Signed)
[facility] for Electrolyte Monitoring and Replacement   Recent Labs: Potassium (mmol/L)  Date Value  08/15/2019 4.0   Magnesium (mg/dL)  Date Value  08/15/2019 2.0   Calcium (mg/dL)  Date Value  08/15/2019 8.7 (L)   Albumin (g/dL)  Date Value  08/11/2019 2.9 (L)   Phosphorus (mg/dL)  Date Value  08/11/2019 4.4   Sodium (mmol/L)  Date Value  08/15/2019 137   Corrected Ca: 9.68 mg/dL  Assessment: 43 y.o. male admitted on 08/03/2019 with sepsis.  Patient receives dialysis TThSat.    Goal of Therapy:  Due to cardiac history: Potassium 4.0 - 5.1 mmol/L Magnesium 2.0 - 2.4 mg/dL Other electrolytes WNL  Plan:  Electrolytes WNL. No supplementation is warranted for today.  Follow up electrolytes with am labs.   Olivia Canter, [facility] 08/15/2019 10:08 AM

## 2019-08-15 NOTE — Progress Notes (Signed)
   08/15/19 1415  Neurological  Level of Consciousness Alert  Orientation Level Oriented X4  Respiratory  Respiratory Pattern Regular  Bilateral Breath Sounds Clear;Diminished  Cardiac  Pulse Regular  pt tolerated HD well no issues cvc WDL UFG 3538ml via bed.

## 2019-08-15 NOTE — Progress Notes (Signed)
   08/15/19 1025  Neurological  Level of Consciousness Alert  Orientation Level Oriented X4  Respiratory  Respiratory Pattern Regular  Bilateral Breath Sounds Clear;Diminished  Cardiac  Pulse Regular  ECG Monitor Yes  Cardiac Rhythm NSR  pt reported to unit via bed with telemetry in use no c/os no distress noted R DC intact WDL UFG 3.0

## 2019-08-15 NOTE — Progress Notes (Signed)
He has been instructed that he is to stay in bed while he has the femoral line on numerous occasions and numerous times by this RN.  Found patient up and in the [place] when I went in to give him his meds.  His bed alarm was off.  It was on during change of shift rounding this am.  Dressing was barely attached and dressing and micro disc were both wet.  Line re dressed.  Requested to Dr. Posey Pronto that if at all possible the line be removed as it was clearly contaminated this morning and the patient is non-compliant.  Will DC the line when he returns from HD.

## 2019-08-16 LAB — BASIC METABOLIC PANEL
Anion gap: 13 (ref 5–15)
BUN: 21 mg/dL — ABNORMAL HIGH (ref 6–20)
CO2: 32 mmol/L (ref 22–32)
Calcium: 8.5 mg/dL — ABNORMAL LOW (ref 8.9–10.3)
Chloride: 93 mmol/L — ABNORMAL LOW (ref 98–111)
Creatinine, Ser: 3.05 mg/dL — ABNORMAL HIGH (ref 0.61–1.24)
GFR calc Af Amer: 27 mL/min — ABNORMAL LOW (ref >60)
GFR calc non Af Amer: 24 mL/min — ABNORMAL LOW (ref >60)
Glucose, Bld: 124 mg/dL — ABNORMAL HIGH (ref 70–99)
Potassium: 3.4 mmol/L — ABNORMAL LOW (ref 3.5–5.1)
Sodium: 138 mmol/L (ref 135–145)

## 2019-08-16 LAB — GLUCOSE, CAPILLARY
Glucose-Capillary: 105 mg/dL — ABNORMAL HIGH (ref 70–99)
Glucose-Capillary: 126 mg/dL — ABNORMAL HIGH (ref 70–99)
Glucose-Capillary: 153 mg/dL — ABNORMAL HIGH (ref 70–99)

## 2019-08-16 LAB — MAGNESIUM: Magnesium: 1.8 mg/dL (ref 1.7–2.4)

## 2019-08-16 MED ORDER — PREDNISONE 10 MG PO TABS: 30.0000 mg | ORAL_TABLET | Freq: Every day | ORAL | Status: DC

## 2019-08-16 MED ORDER — POTASSIUM CHLORIDE CRYS ER 20 MEQ PO TBCR
20.0000 meq | EXTENDED_RELEASE_TABLET | Freq: Once | ORAL | Status: AC
  Administered 2019-08-16: 20 meq via ORAL
  Filled 2019-08-16: qty 1

## 2019-08-16 MED ORDER — DIAZEPAM 2 MG PO TABS
2.0000 mg | ORAL_TABLET | Freq: Once | ORAL | Status: AC
  Administered 2019-08-16: 2 mg via ORAL
  Filled 2019-08-16: qty 1

## 2019-08-16 MED ORDER — PREDNISONE 10 MG PO TABS: 10.0000 mg | ORAL_TABLET | Freq: Every day | ORAL | Status: DC

## 2019-08-16 MED ORDER — PREDNISONE 20 MG PO TABS: 20.0000 mg | ORAL_TABLET | Freq: Every day | ORAL | Status: DC

## 2019-08-16 MED ORDER — PREDNISONE 20 MG PO TABS
40.0000 mg | ORAL_TABLET | Freq: Every day | ORAL | Status: AC
  Administered 2019-08-17: 09:00:00 40 mg via ORAL
  Filled 2019-08-16: qty 2

## 2019-08-16 NOTE — Consult Note (Signed)
[place] for Electrolyte Monitoring and Replacement   Recent Labs: Potassium (mmol/L)  Date Value  08/16/2019 3.4 (L)   Magnesium (mg/dL)  Date Value  08/16/2019 1.8   Calcium (mg/dL)  Date Value  08/16/2019 8.5 (L)   Albumin (g/dL)  Date Value  08/11/2019 2.9 (L)   Phosphorus (mg/dL)  Date Value  08/11/2019 4.4   Sodium (mmol/L)  Date Value  08/16/2019 138   Corrected Ca: 9.68 mg/dL  Assessment: 43 y.o. male admitted on 08/03/2019 with sepsis.  Patient receives dialysis TThSat.    Goal of Therapy:  Due to cardiac history: Potassium 4.0 - 5.1 mmol/L Magnesium 2.0 - 2.4 mg/dL Other electrolytes WNL  Plan:  KCL 20 meq x 1 dose.   Follow up electrolytes with am labs.   Olivia Canter, [facility] 08/16/2019 1:38 PM

## 2019-08-16 NOTE — Progress Notes (Signed)
[place] at [place] NAME: Brett Small    MR#:  [id number]  DATE OF BIRTH:  [birth year]  SUBJECTIVE:   Patient overall doing well. No new issues RN will try get peripheral IV if able to REVIEW OF SYSTEMS:   Review of Systems  Constitutional: Negative for chills, fever and weight loss.  HENT: Negative for ear discharge, ear pain and nosebleeds.   Eyes: Negative for blurred vision, pain and discharge.  Respiratory: Negative for sputum production, shortness of breath, wheezing and stridor.   Cardiovascular: Negative for chest pain, palpitations, orthopnea and PND.  Gastrointestinal: Negative for abdominal pain, diarrhea, nausea and vomiting.  Genitourinary: Negative for frequency and urgency.  Musculoskeletal: Negative for back pain and joint pain.  Neurological: Negative for sensory change, speech change, focal weakness and weakness.  Psychiatric/Behavioral: Negative for depression and hallucinations. The patient is not nervous/anxious.    Tolerating Diet: yes Tolerating PT: ambulatory  DRUG ALLERGIES:   Allergies  Allergen Reactions  . Codeine Nausea Only    Patient questioned this (??)  . Sulfa Antibiotics Hives and Nausea And Vomiting    VITALS:  Blood pressure (!) 154/99, pulse 82, temperature 98.1 F (36.7 C), temperature source Oral, resp. rate 18, height 5\' 1"  (1.549 m), weight 44.7 kg, SpO2 100 %.  PHYSICAL EXAMINATION:   Physical Exam  GENERAL:  43 y.o.-year-old patient lying in the bed with no acute distress.  EYES: Pupils equal, round, reactive to light and accommodation. No scleral icterus. Extraocular muscles intact.  HEENT: Head atraumatic, normocephalic. Oropharynx and nasopharynx clear.  NECK:  Supple, no jugular venous distention. No thyroid enlargement, no tenderness.  LUNGS: Normal breath sounds bilaterally, no wheezing, rales, rhonchi. No use of accessory muscles of respiration.  CARDIOVASCULAR: S1,  S2 normal. No murmurs, rubs, or gallops.  ABDOMEN: Soft, nontender, nondistended. Bowel sounds present. No organomegaly or mass.  EXTREMITIES: No cyanosis, clubbing or edema b/l.    NEUROLOGIC: Cranial nerves II through XII are intact. No focal Motor or sensory deficits b/l.   PSYCHIATRIC:  patient is alert and oriented x 3.  SKIN: No obvious rash, lesion, or ulcer.   LABORATORY PANEL:  CBC Recent Labs  Lab 08/13/19 0627  WBC 9.7  HGB 8.8*  HCT 27.9*  PLT 162    Chemistries  Recent Labs  Lab 08/15/19 0545  NA 137  K 4.0  CL 99  CO2 27  GLUCOSE 144*  BUN 34*  CREATININE 3.91*  CALCIUM 8.7*  MG 2.0   Cardiac Enzymes No results for input(s): TROPONINI in the last 168 hours. RADIOLOGY:  No results found. ASSESSMENT AND PLAN:  1. Status post PEA arrest.  Patient had return of spontaneous circulation following ACLS.   VQ scan ruled out pulmonary embolism, is probably from hypotension following hemodialysis. patient is a DNR.  -Patient will need cardiology to follow-up given his EF less than 20% for possible AICD placement down the road -he has seen Dr. Neoma Laming in September. D/w Dr Humphrey Rolls on Monday regarding AICD/vest before he discharges -Need to taper him off his  Solu-Medrol that was given during cardiac arrest--on po prednisone  2.S/P hypoxic and hypercapnic respiratory failure.   -Patient extubated 08/07/2019 and placed on nasal cannula and weaned off oxygen via nasal cannula..  - Off cefepime antibiotic.  - VQ scan negative for pulmonary embolism.  3.Acute on chronic systolic congestive heart failure with EF less than 20.  - -Dialysis to manage fluid  volume.   -Nephrology is following patient closely -continue Coreg, hydralazine  4.End-stage renal disease on hemodialysis.  - Tuesday Thursday Saturday schedule.    5 .Possible seizure on Keppra.   -Continue Keppra 500 twice daily.  - EEG with diffuse slowing without any sharps.   6.Concern for AVG  aneurysm-vascular consulted.    -Right upper arm brachial artery to axillary vein arteriovenous graft with 6 mm Propaten pTFE graft done thursday.  - Wound dehiscence to the right AC incision.   -s/p  vascular surgery taken patient back to the OR for washout and closure on 10/23--confirm with vascular for discharge plans if anything else needs to be done  7.History of PAD- vascular surgery follow-up appreciated--on po eliquis  8. DVT prophylaxis on PO eliquis  Care management for discharge planning. Poor long-term prognosis   CODE STATUS: DNR  DVT Prophylaxis: eliquis  TOTAL TIME TAKING CARE OF THIS PATIENT: *30* minutes.  >50% time spent on counselling and coordination of care  POSSIBLE D/C IN 1-2* DAYS, DEPENDING ON CLINICAL CONDITION.  Note: This dictation was prepared with Dragon dictation along with smaller phrase technology. Any transcriptional errors that result from this process are unintentional.  Fritzi Mandes M.D on 08/16/2019 at 12:05 PM  Between 7am to 6pm - Pager - [phone]  After 6pm go to [email or web address] - password [email or web address]  [facility]  Office  [phone]  CC: Primary care physician; Patient, No Pcp PerPatient ID: Emmitte Spindel, male   DOB: 07/22/1976, 43 y.o.   MRN: [id number]

## 2019-08-16 NOTE — Progress Notes (Addendum)
Brett Small  MRN: [id number]  DOB/AGE: 02-13-1976 43 y.o.  Primary Care Physician:Patient, [name]  Admit date: 08/03/2019  Chief Complaint:  Chief Complaint  Patient presents with  . Respiratory Distress    S-Pt presented on  08/03/2019 with  Chief Complaint  Patient presents with  . Respiratory Distress  .    Pt today feels better.     Meds  . apixaban  2.5 mg Oral BID  . aspirin  81 mg Per Tube Daily  . carvedilol  25 mg Oral BID WC  . Chlorhexidine Gluconate Cloth  6 each Topical Q0600  . epoetin (EPOGEN/PROCRIT) injection  10,000 Units Intravenous Q T,Th,Sa-HD  . feeding supplement (NEPRO CARB STEADY)  237 mL Oral BID BM  . hydrALAZINE  25 mg Oral Q8H  . insulin aspart  0-5 Units Subcutaneous QHS  . insulin aspart  0-9 Units Subcutaneous TID WC  . levETIRAcetam  500 mg Oral BID  . multivitamin  1 tablet Oral QHS  . pantoprazole  20 mg Oral Daily  . predniSONE  50 mg Oral Q breakfast  . sevelamer carbonate  1,600 mg Oral TID WC  . sodium chloride flush  10-40 mL Intracatheter Q12H         [id number] from the symptoms mentioned above,there are no other symptoms referable to all systems reviewed.  Physical Exam: Vital signs in last 24 hours: Temp:  [98 F (36.7 C)-98.1 F (36.7 C)] 98.1 F (36.7 C) (10/25 0825) Pulse Rate:  [79-93] 82 (10/25 0825) Resp:  [10-20] 18 (10/25 0825) BP: (119-154)/(72-106) 154/99 (10/25 0825) SpO2:  [92 %-100 %] 100 % (10/25 0825) Weight change:  Last BM Date: 08/15/19  Intake/Output from previous day: 10/24 0701 - 10/25 0700 In: 240 [P.O.:240] Out: 3100 [Urine:100] No intake/output data recorded.   Physical Exam: General- pt is awake,alert, oriented to time place and person Resp- No acute REsp distress, CTA B/L NO Rhonchi CVS- S1S2 regular in rate and rhythm GIT- BS+, soft, NT, ND EXT- NO LE Edema, Cyanosis Access tunneled cath being used today Right AV graft placed during this admission Right arm and Ace  bandage secondary to recent wound dehiscence Left AV graft in situ but cannot be used  Lab Results: CBC No results for input(s): WBC, HGB, HCT, PLT in the last 72 hours.  BMET Recent Labs    08/14/19 0630 08/15/19 0545  NA 139 137  K 3.6 4.0  CL 100 99  CO2 26 27  GLUCOSE 149* 144*  BUN 19 34*  CREATININE 2.68* 3.91*  CALCIUM 8.6* 8.7*    MICRO No results found for this or any previous visit (from the past 240 hour(s)).    Lab Results  Component Value Date   PTH 336 (H) 02/04/2019   CALCIUM 8.7 (L) 08/15/2019   CAION 0.91 (L) 12/25/2018   PHOS 4.4 08/11/2019               Impression:   43 y.o.white male with end-stage renal disease on hemodialysis, hypertension, peripheral vascular disease, depression who was admitted with shortness of  Breath . Pt later was successfully resuscitated when he had PEA. AVG placed placed during this admission and he was taken back to OR for complication of his dialysis device.    1)Renal ESRD               Patient is on hemodialysis               Patient is  on Tuesday Thursday Saturday schedule               No need for hemodialysis today  2)HTN  Medication- On Alpha and beta Blockers  On Vasodilators-   3)Anemia of chronic disease  HGb just below the  goal (9--11) We will keep on Epogen during dialysis  4) secondary hyperparathyroidism CKD Mineral-Bone Disorder  Secondary Hyperparathyroidism  present  Phosphorus not at goal. On binders   5)Acute respiratory failure/PEA arrest.  V/Q was low probablility On 10/14 -cardiac arrest - Appreciate cardiology and neurology input - Started on Keppra for possible seizure.   6) electrolytes Normokalemic NOrmonatremic   7)Acid base Co2 at goal  8) complication of dialysis device   Pt had a AV graft placement during this hospitalization   Wound dehiscence was noted to the right AC incision,patient was taken back to the OR by the vascular surgery for washout  and closure .   Now stable   Plan:  Will continue current care    Manpreet s [facility] 08/16/2019, 9:08 AM

## 2019-08-16 NOTE — Plan of Care (Signed)
  Problem: Education: Goal: Knowledge of General Education information will improve Description: Including pain rating scale, medication(s)/side effects and non-pharmacologic comfort measures Outcome: Progressing   Problem: Safety: Goal: Ability to remain free from injury will improve Outcome: Not Progressing  impulsive

## 2019-08-17 DIAGNOSIS — L7682 Other postprocedural complications of skin and subcutaneous tissue: Secondary | ICD-10-CM | POA: Diagnosis not present

## 2019-08-17 LAB — BASIC METABOLIC PANEL
Anion gap: 14 (ref 5–15)
BUN: 34 mg/dL — ABNORMAL HIGH (ref 6–20)
CO2: 27 mmol/L (ref 22–32)
Calcium: 8.7 mg/dL — ABNORMAL LOW (ref 8.9–10.3)
Chloride: 97 mmol/L — ABNORMAL LOW (ref 98–111)
Creatinine, Ser: 3.79 mg/dL — ABNORMAL HIGH (ref 0.61–1.24)
GFR calc Af Amer: 21 mL/min — ABNORMAL LOW (ref >60)
GFR calc non Af Amer: 18 mL/min — ABNORMAL LOW (ref >60)
Glucose, Bld: 106 mg/dL — ABNORMAL HIGH (ref 70–99)
Potassium: 3.3 mmol/L — ABNORMAL LOW (ref 3.5–5.1)
Sodium: 138 mmol/L (ref 135–145)

## 2019-08-17 LAB — GLUCOSE, CAPILLARY
Glucose-Capillary: 105 mg/dL — ABNORMAL HIGH (ref 70–99)
Glucose-Capillary: 149 mg/dL — ABNORMAL HIGH (ref 70–99)

## 2019-08-17 MED ORDER — LEVETIRACETAM 500 MG PO TABS: 500.0000 mg | ORAL_TABLET | Freq: Two times a day (BID) | ORAL | 0 refills | Status: DC

## 2019-08-17 MED ORDER — POTASSIUM CHLORIDE CRYS ER 20 MEQ PO TBCR
40.0000 meq | EXTENDED_RELEASE_TABLET | Freq: Once | ORAL | Status: AC
  Administered 2019-08-17: 40 meq via ORAL
  Filled 2019-08-17: qty 2

## 2019-08-17 MED ORDER — MAGNESIUM SULFATE 2 GM/50ML IV SOLN
2.0000 g | Freq: Once | INTRAVENOUS | Status: AC
  Administered 2019-08-17: 2 g via INTRAVENOUS
  Filled 2019-08-17: qty 50

## 2019-08-17 MED ORDER — PREDNISONE 10 MG PO TABS: ORAL_TABLET | ORAL | 0 refills | Status: DC

## 2019-08-17 NOTE — Progress Notes (Signed)
[facility]  ROUNDING NOTE   Subjective:   Doing fair Denies any acute complaints Not very interactive this morning No shortness of breath  Objective:  Vital signs in last 24 hours:  Temp:  [97.9 F (36.6 C)-98.8 F (37.1 C)] 98.8 F (37.1 C) (10/26 0722) Pulse Rate:  [79-86] 86 (10/26 0722) Resp:  [16-20] 18 (10/26 0722) BP: (116-141)/(64-92) 127/71 (10/26 0722) SpO2:  [97 %-100 %] 99 % (10/26 0722) Weight:  [43.5 kg] 43.5 kg (10/26 0447)  Weight change:  Filed Weights   08/13/19 0544 08/15/19 0425 08/17/19 0447  Weight: 44.6 kg 44.7 kg 43.5 kg    Intake/Output: I/O last 3 completed shifts: In: 480 [P.O.:480] Out: -    Intake/Output this shift:  Total I/O In: 240 [P.O.:240] Out: -   Physical Exam: General: No acute distress  Head/ENT: [place]/AT moist oral mucous membranes  Lungs:  Clear to auscultation, normal effort  Heart: regular, soft systolic  Abdomen:  Soft, nontender,   Extremities: No peripheral edema.  Neurologic:  Lethargic but able to answer few simple questions  Skin: No lesions  Access: Right IJ PC, right upper arm AV graft placed October 21     Basic Metabolic Panel: Recent Labs  Lab 08/11/19 0538 08/12/19 0459 08/13/19 0627 08/14/19 0630 08/15/19 0545 08/16/19 1228 08/17/19 0813  NA 137 141 139 139 137 138 138  K 3.5 3.7 3.9 3.6 4.0 3.4* 3.3*  CL 101 102 99 100 99 93* 97*  CO2 23 25 22 26 27  32 27  GLUCOSE 115* 132* 146* 149* 144* 124* 106*  BUN 46* 29* 42* 19 34* 21* 34*  CREATININE 4.67* 3.44* 4.65* 2.68* 3.91* 3.05* 3.79*  CALCIUM 8.6* 8.6* 8.5* 8.6* 8.7* 8.5* 8.7*  MG 2.1 1.9 2.5* 2.0 2.0 1.8  --   PHOS 4.4  --   --   --   --   --   --     Liver Function Tests: Recent Labs  Lab 08/11/19 0538  ALBUMIN 2.9*   No results for input(s): LIPASE, AMYLASE in the last 168 hours. No results for input(s): AMMONIA in the last 168 hours.  CBC: Recent Labs  Lab 08/11/19 0538 08/12/19 0459 08/13/19 0627  WBC 11.0*  8.3 9.7  HGB 8.9* 9.1* 8.8*  HCT 27.9* 28.9* 27.9*  MCV 101.1* 101.4* 102.6*  PLT 186 205 162    Cardiac Enzymes: No results for input(s): CKTOTAL, CKMB, CKMBINDEX, TROPONINI in the last 168 hours.  BNP: Invalid input(s): POCBNP  CBG: Recent Labs  Lab 08/15/19 2138 08/16/19 0848 08/16/19 1209 08/16/19 2154 08/17/19 0803  GLUCAP 102* 105* 126* 153* 105*    Microbiology: Results for orders placed or performed during the hospital encounter of 08/03/19  SARS Coronavirus 2 by RT PCR (hospital order, performed in [facility] hospital lab) Nasopharyngeal Nasopharyngeal Swab     Status: None   Collection Time: 08/03/19  9:25 AM   Specimen: Nasopharyngeal Swab  Result Value Ref Range Status   SARS Coronavirus 2 NEGATIVE NEGATIVE Final    Comment: (NOTE) If result is NEGATIVE SARS-CoV-2 target nucleic acids are NOT DETECTED. The SARS-CoV-2 RNA is generally detectable in upper and lower  respiratory specimens during the acute phase of infection. The lowest  concentration of SARS-CoV-2 viral copies this assay can detect is 250  copies / mL. A negative result does not preclude SARS-CoV-2 infection  and should not be used as the sole basis for treatment or other  patient management decisions.  A negative result may occur with  improper specimen collection / handling, submission of specimen other  than nasopharyngeal swab, presence of viral mutation(s) within the  areas targeted by this assay, and inadequate number of viral copies  (<250 copies / mL). A negative result must be combined with clinical  observations, patient history, and epidemiological information. If result is POSITIVE SARS-CoV-2 target nucleic acids are DETECTED. The SARS-CoV-2 RNA is generally detectable in upper and lower  respiratory specimens dur ing the acute phase of infection.  Positive  results are indicative of active infection with SARS-CoV-2.  Clinical  correlation with patient history and other  diagnostic information is  necessary to determine patient infection status.  Positive results do  not rule out bacterial infection or co-infection with other viruses. If result is PRESUMPTIVE POSTIVE SARS-CoV-2 nucleic acids MAY BE PRESENT.   A presumptive positive result was obtained on the submitted specimen  and confirmed on repeat testing.  While 2019 novel coronavirus  (SARS-CoV-2) nucleic acids may be present in the submitted sample  additional confirmatory testing may be necessary for epidemiological  and / or clinical management purposes  to differentiate between  SARS-CoV-2 and other Sarbecovirus currently known to infect humans.  If clinically indicated additional testing with an alternate test  methodology [phone]) is advised. The SARS-CoV-2 RNA is generally  detectable in upper and lower respiratory sp ecimens during the acute  phase of infection. The expected result is Negative. Fact Sheet for Patients:  StrictlyIdeas.no Fact Sheet for Healthcare Providers: BankingDealers.co.za This test is not yet approved or cleared by the [place] FDA and has been authorized for detection and/or diagnosis of SARS-CoV-2 by FDA under an Emergency Use Authorization (EUA).  This EUA will remain in effect (meaning this test can be used) for the duration of the COVID-19 declaration under Section 564(b)(1) of the Act, 21 U.S.C. section 360bbb-3(b)(1), unless the authorization is terminated or revoked sooner. Performed at [facility], [address] [place], [place] [address]   Blood culture (routine x 2)     Status: None   Collection Time: 08/03/19 10:05 AM   Specimen: BLOOD  Result Value Ref Range Status   Specimen Description BLOOD BLOOD RIGHT FOREARM  Final   Special Requests   Final    BOTTLES DRAWN AEROBIC AND ANAEROBIC Blood Culture adequate volume   Culture   Final    NO GROWTH 5 DAYS Performed at [facility], [address] [place], [place] [address]    Report Status 08/08/2019 FINAL  Final  MRSA PCR Screening     Status: None   Collection Time: 08/04/19  7:56 AM   Specimen: Nasopharyngeal  Result Value Ref Range Status   MRSA by PCR NEGATIVE NEGATIVE Final    Comment:        The GeneXpert MRSA Assay (FDA approved for NASAL specimens only), is one component of a comprehensive MRSA colonization surveillance program. It is not intended to diagnose MRSA infection nor to guide or monitor treatment for MRSA infections. Performed at [facility], [address] [place], [place] [address]   Urine Culture     Status: None   Collection Time: 08/05/19 10:14 AM   Specimen: Urine, Random  Result Value Ref Range Status   Specimen Description   Final    URINE, RANDOM Performed at [facility], [address] [place], [place] [address]    Special Requests   Final    NONE Performed at [facility], [address] [place], [place] [address]    Culture   Final    NO GROWTH Performed at [facility], [place] [address], [place], [place] [address]    Report Status 08/06/2019 FINAL  Final  Culture, respiratory (non-expectorated)     Status: None   Collection Time: 08/05/19  8:40 PM   Specimen: Tracheal Aspirate; Respiratory  Result Value Ref Range Status   Specimen Description   Final    TRACHEAL ASPIRATE Performed at [facility], [address] [place], [place] [address]    Special Requests   Final    Normal Performed at [facility], [address] [place], [place] [address]    Gram Stain   Final    FEW WBC PRESENT,BOTH PMN AND MONONUCLEAR NO ORGANISMS SEEN    Culture   Final    RARE Consistent with normal respiratory flora. Performed at [facility], [place] [address] [place], [place] [address]    Report Status 08/08/2019 FINAL  Final    Coagulation Studies: No results for input(s): LABPROT, INR in the last 72  hours.  Urinalysis: No results for input(s): COLORURINE, LABSPEC, PHURINE, GLUCOSEU, HGBUR, BILIRUBINUR, KETONESUR, PROTEINUR, UROBILINOGEN, NITRITE, LEUKOCYTESUR in the last 72 hours.  Invalid input(s): APPERANCEUR    Imaging: No results found.   Medications:   . sodium chloride Stopped (08/15/19 0947)  . magnesium sulfate bolus IVPB     . apixaban  2.5 mg Oral BID  . aspirin  81 mg Per Tube Daily  . carvedilol  25 mg Oral BID WC  . Chlorhexidine Gluconate Cloth  6 each Topical Q0600  . epoetin (EPOGEN/PROCRIT) injection  10,000 Units Intravenous Q T,Th,Sa-HD  . feeding supplement (NEPRO CARB STEADY)  237 mL Oral BID BM  . hydrALAZINE  25 mg Oral Q8H  . insulin aspart  0-5 Units Subcutaneous QHS  . insulin aspart  0-9 Units Subcutaneous TID WC  . levETIRAcetam  500 mg Oral BID  . multivitamin  1 tablet Oral QHS  . pantoprazole  20 mg Oral Daily  . potassium chloride  40 mEq Oral Once  . [START ON 08/18/2019] predniSONE  30 mg Oral Q breakfast   Followed by  . [START ON 08/19/2019] predniSONE  20 mg Oral Q breakfast   Followed by  . [START ON 08/20/2019] predniSONE  10 mg Oral Q breakfast  . sevelamer carbonate  1,600 mg Oral TID WC  . sodium chloride flush  10-40 mL Intracatheter Q12H   sodium chloride, acetaminophen, albuterol, labetalol, nitroGLYCERIN, ondansetron (ZOFRAN) IV, promethazine, sodium chloride flush, zolpidem  Assessment/ Plan:  43 y.o.white male with end-stage renal disease on hemodialysis, hypertension, peripheral vascular disease, depression.  [place] TTS  Left AVG  1.  ESRD on HD TTS.  - Continue TTS schedule -Status post stent placement for pseudoaneurysm on October 22 -Status post right arm brachial axillary to right arm October 21 complicated by wound dehiscence.  Irrigated and sutures applied on October 23.  2.  Acute respiratory failure/PEA arrest.  On October 14.  V/Q was low probablility - Appreciate cardiology and  neurology input - Started on Keppra for possible seizure.   3.  Anemia of chronic kidney disease:   - EPO with HD treatment  4.  Secondary hyperparathyroidism: . -Currently on sevelamer 1600 mg 3 times with meals  5.  Chronic systolic CHF with EF less than 20%.   AICD placement is being considered  LOS: [place] 10/26/202010:49 AM

## 2019-08-17 NOTE — Consult Note (Signed)
[place] [facility] for Electrolyte Monitoring and Replacement   Recent Labs: Potassium (mmol/L)  Date Value  08/17/2019 3.3 (L)   Magnesium (mg/dL)  Date Value  08/16/2019 1.8   Calcium (mg/dL)  Date Value  08/17/2019 8.7 (L)   Albumin (g/dL)  Date Value  08/11/2019 2.9 (L)   Phosphorus (mg/dL)  Date Value  08/11/2019 4.4   Sodium (mmol/L)  Date Value  08/17/2019 138   Corrected Ca: 9.68 mg/dL  Assessment: 43 y.o. male admitted on 08/03/2019 with sepsis.  Patient receives dialysis TThSat.  On Sevelmer carbonate 1600 mg TID   Goal of Therapy:  Due to cardiac history: Potassium 4.0 - 5.1 mmol/L Magnesium 2.0 - 2.4 mg/dL Other electrolytes WNL  Plan:  KCL 40 meq x 1 PO and Mg 2 g IV x 1.  Follow up electrolytes with am labs.   Oswald Hillock, PharmD, BCPS 08/17/2019 9:29 AM

## 2019-08-17 NOTE — Discharge Summary (Signed)
Brett Small at [place] NAME: Brett Small    MR#:  [id number]  DATE OF BIRTH:  09/03/1976  DATE OF ADMISSION:  08/03/2019   ADMITTING PHYSICIAN: Brett Mormon, MD  DATE OF DISCHARGE: 08/17/19  PRIMARY CARE PHYSICIAN: Patient, [name]   ADMISSION DIAGNOSIS:  RUQ pain [R10.11] Flash pulmonary edema (HCC) [J81.0] Acute respiratory failure with hypoxia (HCC) [J96.01] Other elevated white blood cell (WBC) count [D72.828] Acute CHF (congestive heart failure) (HCC) [I50.9] DISCHARGE DIAGNOSIS:  Active Problems:   Acute CHF (congestive heart failure) (HCC)   Malnutrition of moderate degree   Seizure ([place])  SECONDARY DIAGNOSIS:   Past Medical History:  Diagnosis Date  . ESRD (end stage renal disease) ([place])   . Hypertension   . PAD (peripheral artery disease) (HCC)    Required aortofemoral stent-which had closed and had to redo the procedure and ischemia of limb.  . Peripheral vascular disease ([place])   . Renal disorder   . Secondary hyperparathyroidism of renal origin [facility])    HOSPITAL COURSE:   Brett Small is a 43 year old male who presented to the ED with his of breath.  In the ED, chest x-ray showed worsening pulmonary edema and a possible small left pleural effusion.  Placed on BiPAP and was admitted for further management.  Status post PEA arrest- occurred on 10/14 -ROSC obtained after 10 minutes of ACLS -Patient was emergently intubated and then later extubated on 10/16 -Patient was changed to a DNR -VQ scan was negative for PE  Acute on chronic systolic congestive heart failure -ECHO 10/13 with EF < 20% -Volume management with dialysis -Continued Coreg and hydralazine -Patient will have close f/u with cardiology as an outpatient for consideration of AICD placement  End-stage renal disease on hemodialysis.  -Received dialysis per his usual schedule  Possible seizure on Keppra- noted while he was in the ICU  -EEG with  diffuse slowing without any sharps -Continued Keppra 500 twice daily.    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  Patient has multiple medical problems and is at high risk of readmission.  DISCHARGE CONDITIONS:  PEA arrest Acute on chronic systolic congestive heart failure ESRD on HD Possible seizures AVG aneurysm CONSULTS OBTAINED:  Treatment Team:  Anthonette Legato, MD Yolonda Kida, MD Leotis Pain, MD DRUG ALLERGIES:   Allergies  Allergen Reactions  . Codeine Nausea Only    Patient questioned this (??)  . Sulfa Antibiotics Hives and Nausea And Vomiting   DISCHARGE MEDICATIONS:   Allergies as of 08/17/2019      Reactions   Codeine Nausea Only   Patient questioned this (??)   Sulfa Antibiotics Hives, Nausea And Vomiting      Medication List    STOP taking these medications   amLODipine 10 MG tablet Commonly known as: NORVASC     TAKE these medications   apixaban 2.5 MG Tabs tablet Commonly known as: ELIQUIS Take 1 tablet (2.5 mg total) by mouth 2 (two) times daily.   aspirin EC 81 MG tablet Take 1 tablet (81 mg total)  by mouth daily.   atorvastatin 80 MG tablet Commonly known as: LIPITOR Take 1 tablet (80 mg total) by mouth at bedtime.   carvedilol 25 MG tablet Commonly known as: COREG Take 1 tablet (25 mg total) by mouth 2 (two) times daily with a meal.   feeding supplement (NEPRO CARB STEADY) Liqd Take 237 mLs by mouth 2 (two) times daily between meals.   gabapentin 100 MG  capsule Commonly known as: NEURONTIN Take 1 capsule (100 mg total) by mouth 3 (three) times daily.   hydrALAZINE 25 MG tablet Commonly known as: APRESOLINE Take 1 tablet (25 mg total) by mouth every 8 (eight) hours.   hydrOXYzine 25 MG tablet Commonly known as: ATARAX/VISTARIL Take 25 mg by mouth 3 (three) times daily as needed for anxiety.   levETIRAcetam 500 MG tablet Commonly known as: KEPPRA Take 1 tablet (500 mg total) by mouth 2 (two) times daily.   loratadine 10 MG tablet Commonly known as: CLARITIN Take 1 tablet (10 mg total) by mouth daily.   b complex-C-folic acid 1 MG capsule Take 1 capsule by mouth daily after supper.   multivitamin Tabs tablet Take 1 tablet by mouth daily.   neomycin-bacitracin-polymyxin Oint Commonly known as: NEOSPORIN Apply 1 application topically 2 (two) times daily.   predniSONE 10 MG tablet Commonly known as: DELTASONE Take 3 tablets (30mg ) by mouth on 10/27, then take 2 tablets (20mg ) by mouth on 10/28, then take 1 tablet (10mg ) by mouth on 10/29   sevelamer carbonate 800 MG tablet Commonly known as: RENVELA Take 2 tablets (1,600 mg total) by mouth 3 (three) times daily with meals.        DISCHARGE INSTRUCTIONS:  1.  Follow-up with PCP in 5 days 2.  Follow-up with cardiology in 1 week 3.  Follow-up with vascular surgery in 1 week DIET:  Cardiac diet DISCHARGE CONDITION:  Stable ACTIVITY:  Activity as tolerated OXYGEN:  Home Oxygen: No.  Oxygen Delivery: room air DISCHARGE LOCATION:  home   If you experience worsening of your admission symptoms, develop shortness of breath, life threatening emergency, suicidal or homicidal thoughts you must seek medical attention immediately by calling 911 or calling your MD immediately  if symptoms less severe.  You Must read complete instructions/literature along with all the possible adverse reactions/side effects for all the Medicines you take and that have been prescribed to you.  Take any new Medicines after you have completely understood and accpet all the possible adverse reactions/side effects.   Please note  You were cared for by a hospitalist during your hospital stay. If you have any questions about your discharge medications or the care you received while you were in the hospital after you are discharged, you can call the unit and asked to speak with the hospitalist on call if the hospitalist that took care of you is not available. Once you are discharged, your primary care physician will handle any further medical issues. Please note that NO REFILLS for any discharge medications will be authorized once you are discharged, as it is imperative that you return to your primary care physician (or establish a relationship with a primary care physician if you do not have one) for your aftercare needs so that they can reassess your need for medications and monitor your lab values.    On the day of Discharge:  VITAL SIGNS:  Blood pressure 127/71, pulse 86, temperature 98.8 F (37.1 C), resp. rate 18, height 5\' 1"  (1.549 m), weight 43.5 kg,  SpO2 99 %. PHYSICAL EXAMINATION:  GENERAL:  43 y.o.-year-old patient lying in the bed with no acute distress. + Chronically ill-appearing EYES: Pupils equal, round, reactive to light and accommodation. No scleral icterus. Extraocular muscles intact.  HEENT: Head atraumatic, normocephalic. Oropharynx and nasopharynx clear.  NECK:  Supple, no jugular venous distention. No thyroid enlargement, no tenderness.  LUNGS: Normal breath sounds bilaterally, no wheezing, rales,rhonchi or crepitation. No use of accessory muscles of respiration.  CARDIOVASCULAR: RRR, S1, S2 normal. No murmurs, rubs, or gallops.  ABDOMEN: Soft, non-tender, non-distended. Bowel sounds present. No organomegaly or mass.  EXTREMITIES: No pedal edema, cyanosis, or clubbing.  NEUROLOGIC: Cranial nerves II through XII are intact. Muscle strength 5/5 in all extremities.  Sensation intact. Gait not checked.  PSYCHIATRIC: The patient is alert and oriented x 3.  SKIN: No obvious rash, lesion, or ulcer.  DATA REVIEW:   CBC Recent Labs  Lab 08/13/19 0627  WBC 9.7  HGB 8.8*  HCT 27.9*  PLT 162    Chemistries  Recent Labs  Lab 08/16/19 1228 08/17/19 0813  NA 138 138  K 3.4* 3.3*  CL 93* 97*  CO2 32 27  GLUCOSE 124* 106*  BUN 21* 34*  CREATININE 3.05* 3.79*  CALCIUM 8.5* 8.7*  MG 1.8  --      Microbiology Results  Results for orders placed or performed during the hospital encounter of 08/03/19  SARS Coronavirus 2 by RT PCR (hospital order, performed in [facility] hospital lab) Nasopharyngeal Nasopharyngeal Swab     Status: None   Collection Time: 08/03/19  9:25 AM   Specimen: Nasopharyngeal Swab  Result Value Ref Range Status   SARS Coronavirus 2 NEGATIVE NEGATIVE Final    Comment: (NOTE) If result is NEGATIVE SARS-CoV-2 target nucleic acids are NOT DETECTED. The SARS-CoV-2 RNA is generally detectable in upper and lower  respiratory specimens during the acute phase of infection. The lowest  concentration of SARS-CoV-2 viral copies this assay can detect is 250  copies / mL. A negative result does not preclude SARS-CoV-2 infection  and should not be used as the sole basis for treatment or other  patient management decisions.  A negative result may occur with  improper specimen collection / handling, submission of specimen other  than nasopharyngeal swab, presence of viral mutation(s) within the  areas targeted by this assay, and inadequate number of viral copies  (<250 copies / mL). A negative result must be combined with clinical  observations, patient history, and epidemiological information. If result is POSITIVE SARS-CoV-2 target nucleic acids are DETECTED. The SARS-CoV-2 RNA is generally detectable in upper and lower  respiratory specimens dur ing the acute phase of infection.  Positive  results are indicative of active infection  with SARS-CoV-2.  Clinical  correlation with patient history and other diagnostic information is  necessary to determine patient infection status.  Positive results do  not rule out bacterial infection or co-infection with other viruses. If result is PRESUMPTIVE POSTIVE SARS-CoV-2 nucleic acids MAY BE PRESENT.   A presumptive positive result was obtained on the submitted specimen  and confirmed on repeat testing.  While 2019 novel coronavirus  (SARS-CoV-2) nucleic acids may be present in the submitted sample  additional confirmatory testing may be necessary for epidemiological  and / or clinical management purposes  to differentiate between  SARS-CoV-2 and other Sarbecovirus currently known to infect humans.  If clinically indicated additional testing with an alternate test  methodology [phone]) is advised. The SARS-CoV-2 RNA is  generally  detectable in upper and lower respiratory sp ecimens during the acute  phase of infection. The expected result is Negative. Fact Sheet for Patients:  StrictlyIdeas.no Fact Sheet for Healthcare Providers: BankingDealers.co.za This test is not yet approved or cleared by the [place] FDA and has been authorized for detection and/or diagnosis of SARS-CoV-2 by FDA under an Emergency Use Authorization (EUA).  This EUA will remain in effect (meaning this test can be used) for the duration of the COVID-19 declaration under Section 564(b)(1) of the Act, 21 U.S.C. section 360bbb-3(b)(1), unless the authorization is terminated or revoked sooner. Performed at [facility], [address] [place], [place] [address]   Blood culture (routine x 2)     Status: None   Collection Time: 08/03/19 10:05 AM   Specimen: BLOOD  Result Value Ref Range Status   Specimen Description BLOOD BLOOD RIGHT FOREARM  Final   Special Requests   Final    BOTTLES DRAWN AEROBIC AND ANAEROBIC Blood Culture adequate volume    Culture   Final    NO GROWTH 5 DAYS Performed at [facility], [address] [place], [place] [address]    Report Status 08/08/2019 FINAL  Final  MRSA PCR Screening     Status: None   Collection Time: 08/04/19  7:56 AM   Specimen: Nasopharyngeal  Result Value Ref Range Status   MRSA by PCR NEGATIVE NEGATIVE Final    Comment:        The GeneXpert MRSA Assay (FDA approved for NASAL specimens only), is one component of a comprehensive MRSA colonization surveillance program. It is not intended to diagnose MRSA infection nor to guide or monitor treatment for MRSA infections. Performed at [facility], [address], [place], [place] [address]   Urine Culture     Status: None   Collection Time: 08/05/19 10:14 AM   Specimen: Urine, Random  Result Value Ref Range Status   Specimen Description   Final    URINE, RANDOM Performed at [facility], [address] [place], [place] [address]    Special Requests   Final    NONE Performed at [facility], [address], [place], [place] [address]    Culture   Final    NO GROWTH Performed at [facility], [place] [address], [place], [place] [address]    Report Status 08/06/2019 FINAL  Final  Culture, respiratory (non-expectorated)     Status: None   Collection Time: 08/05/19  8:40 PM   Specimen: Tracheal Aspirate; Respiratory  Result Value Ref Range Status   Specimen Description   Final    TRACHEAL ASPIRATE Performed at [facility], [address] [place], [place] [address]    Special Requests   Final    Normal Performed at [facility], [address] [place], [place] [address]    Gram Stain   Final    FEW WBC PRESENT,BOTH PMN AND MONONUCLEAR NO ORGANISMS SEEN    Culture   Final    RARE Consistent with normal respiratory flora. Performed at [facility], [place] [address], [place], [place] [address]    Report Status 08/08/2019 FINAL  Final     RADIOLOGY:  No results found.   Management plans discussed with the patient, family and they are in agreement.  CODE STATUS: DNR   TOTAL TIME TAKING CARE OF THIS PATIENT: 40 minutes.    Berna Spare [place] M.D on 08/17/2019 at 3:14  PM  Between 7am to 6pm - Pager - [phone]  After 6pm go to [email or web address] - Proofreader  [facility]  Office  [phone]  CC: Primary care physician; Patient, [name]   Note: This dictation was prepared with Dragon dictation along with smaller phrase technology. Any transcriptional errors that result from this process are unintentional.

## 2019-08-17 NOTE — Progress Notes (Addendum)
[facility] Cardiology  SUBJECTIVE:   Cardiology initially consulted s/p PEA arrest on 08/05/2019. Arrest thought to be secondary to hypotension from dialysis. There was no evidence of V-tach or V-fib precipitating cardiac arrest.   In-patient ECHO showed significantly decreased EF to 20%.   Patient reports that he feels well currently and he is asking when he may return home. He denies any recent chest pain, change to his breathing, lightheadedness, or dizziness.   Vitals:   08/16/19 1353 08/16/19 2140 08/17/19 0447 08/17/19 0722  BP: (!) 141/92 116/64 140/86 127/71  Pulse: 79 80 82 86  Resp: 19 20 16 18   Temp: 98.5 F (36.9 C) 97.9 F (36.6 C) 98.3 F (36.8 C) 98.8 F (37.1 C)  TempSrc: Oral Oral    SpO2: 100% 97% 98% 99%  Weight:   43.5 kg   Height:         Intake/Output Summary (Last 24 hours) at 08/17/2019 1140 Last data filed at 08/17/2019 0951 Gross per 24 hour  Intake 240 ml  Output -  Net 240 ml    PHYSICAL EXAM  General: Well developed, well nourished, in no acute distress HEENT:  Normocephalic and atramatic Neck:  No JVD.  Lungs: Clear bilaterally to auscultation and percussion. Heart: HRRR . Normal S1 and S2 without gallops or murmurs.  Extremities: No clubbing, cyanosis or edema.   Neuro: Alert and oriented X 3. Psych:  Good affect, responds appropriately   LABS: Basic Metabolic Panel: Recent Labs    08/15/19 0545 08/16/19 1228 08/17/19 0813  NA 137 138 138  K 4.0 3.4* 3.3*  CL 99 93* 97*  CO2 27 32 27  GLUCOSE 144* 124* 106*  BUN 34* 21* 34*  CREATININE 3.91* 3.05* 3.79*  CALCIUM 8.7* 8.5* 8.7*  MG 2.0 1.8  --    Liver Function Tests: No results for input(s): AST, ALT, ALKPHOS, BILITOT, PROT, ALBUMIN in the last 72 hours. No results for input(s): LIPASE, AMYLASE in the last 72 hours. CBC: No results for input(s): WBC, NEUTROABS, HGB, HCT, MCV, PLT in the last 72 hours. Cardiac Enzymes: No results for input(s): CKTOTAL, CKMB, CKMBINDEX,  TROPONINI in the last 72 hours. BNP: Invalid input(s): POCBNP D-Dimer: No results for input(s): DDIMER in the last 72 hours. Hemoglobin A1C: No results for input(s): HGBA1C in the last 72 hours. Fasting Lipid Panel: No results for input(s): CHOL, HDL, LDLCALC, TRIG, CHOLHDL, LDLDIRECT in the last 72 hours. Thyroid Function Tests: No results for input(s): TSH, T4TOTAL, T3FREE, THYROIDAB in the last 72 hours.  Invalid input(s): FREET3 Anemia Panel: No results for input(s): VITAMINB12, FOLATE, FERRITIN, TIBC, IRON, RETICCTPCT in the last 72 hours.  No results found.  Echo - EF <20%  ASSESSMENT AND PLAN:  [name] is a 43 year old male with a history of ESRD on dialysis (Tue, Thur, Sat), who was initially admitted to the hospital on 10/12 for dyspnea with exertion, then with PEA arrest on 10/14 secondary to hypotension from dialysis, found to have severe LV dysfunction and EF of 20%. He remains on coreg.    Active Problems:   Acute CHF (congestive heart failure) (HCC)   Malnutrition of moderate degree   Seizure ([place])    1. No indication for life-vest at this time given no evidence of V-tach or V-fib precipitating arrest.  2. Close outpatient follow up with cardiology in 1-2 weeks to discuss further heart failure management and possible ICD placement 3. No further cardiac interventions or diagnostics indicated at this  time   The patient's history and exam findings were discussed with Dr. Nehemiah Massed. The plan was made in conjunction with Dr. Nehemiah Massed.  Hilbert Odor, PA-C 08/17/2019 11:40 AM  The patient has been interviewed and examined. I agree with assessment and plan above. Serafina Royals MD [facility]

## 2019-08-17 NOTE — Care Management Important Message (Signed)
Important Message  Patient Details  Name: Brett Small MRN: [id number] Date of Birth: 01/25/1976   Medicare Important Message Given:  Yes     Dannette Barbara 08/17/2019, 11:40 AM

## 2019-08-17 NOTE — Plan of Care (Signed)
Discharge instructions provided to pt.  All questions addressed.  Understanding verified through teach back.  Awaiting transportation home via taxi.   Problem: Education: Goal: Knowledge of General Education information will improve Description: Including pain rating scale, medication(s)/side effects and non-pharmacologic comfort measures Outcome: Adequate for Discharge   Problem: Health Behavior/Discharge Planning: Goal: Ability to manage health-related needs will improve Outcome: Adequate for Discharge   Problem: Clinical Measurements: Goal: Ability to maintain clinical measurements within normal limits will improve Outcome: Adequate for Discharge Goal: Will remain free from infection Outcome: Adequate for Discharge Goal: Diagnostic test results will improve Outcome: Adequate for Discharge Goal: Respiratory complications will improve Outcome: Adequate for Discharge Goal: Cardiovascular complication will be avoided Outcome: Adequate for Discharge   Problem: Activity: Goal: Risk for activity intolerance will decrease Outcome: Adequate for Discharge   Problem: Nutrition: Goal: Adequate nutrition will be maintained Outcome: Adequate for Discharge   Problem: Elimination: Goal: Will not experience complications related to bowel motility Outcome: Adequate for Discharge Goal: Will not experience complications related to urinary retention Outcome: Adequate for Discharge   Problem: Pain Managment: Goal: General experience of comfort will improve Outcome: Adequate for Discharge   Problem: Safety: Goal: Ability to remain free from injury will improve Outcome: Adequate for Discharge   Problem: Skin Integrity: Goal: Risk for impaired skin integrity will decrease Outcome: Adequate for Discharge   Problem: Malnutrition  (NI-5.2) Goal: Food and/or nutrient delivery Description: Individualized approach for food/nutrient provision. Outcome: Adequate for Discharge

## 2019-08-17 NOTE — TOC Transition Note (Addendum)
Transition of Care [facility]) - CM/SW Discharge Note   Patient Details  Name: Brett Small MRN: [id number] Date of Birth: 1976/10/16  Transition of [facility] CM/SW Contact:  Candie Chroman, LCSW Phone Number: 08/17/2019, 10:58 AM   Clinical Narrative: Patient has orders to discharge home today. [facility] representative is aware. Patient was active with PT and RN prior to admission so MD will enter home health orders. No further concerns. CSW signing off.    3:17 pm: Cab voucher filled out. Will give to RN. CSW signing off.  Final next level of care: [place] Barriers to Discharge: Barriers Resolved   Patient Goals and CMS Choice        Discharge Placement                    Patient and family notified of of transfer: 08/17/19  Discharge Plan and Services   Discharge Planning Services: CM Consult                      HH Arranged: RN, PT [facility] Agency: [place] Date [place]: 08/17/19 Time [place]: [id number] Representative spoke with at [place]: [place] ([place]) Interventions     Readmission Risk Interventions Readmission Risk Prevention Plan 08/10/2019 07/28/2019 07/28/2019  Transportation Screening Complete Complete Complete  Medication Review Press photographer) Complete Complete Complete  PCP or Specialist appointment within 3-5 days of discharge - Complete Complete  PCP/Specialist Appt Not Complete comments - - -  [place] or Home Care Consult Complete Complete Complete  SW Recovery Care/Counseling Consult Complete (No Data) -  Palliative Care Screening Patient Refused Not Applicable Not [place] - Not Applicable Not Applicable

## 2019-08-17 NOTE — Progress Notes (Addendum)
[facility] Daily Progress Note   Subjective: 08/14/19:  Right distal extremity (AC) incision dehiscence s/p washout and closure.   08/13/19: 1. Ultrasound guidance vascular access to right femoral artery. 2. Catheter placement toleft brachial artery  from right femoral approach. 3. Thoracic aortogram and selectiveleftupper extremity angiogram 4.Covered stent placement left brachial artery with a 6 mm diameter by 7.5 cm length Viabahn stent to exclude the pseudoaneurysm 5. StarClose closure device right femoral artery.  08/12/19: Rightupper arm brachial artery to axillary vein arteriovenous graft with 6 mm Propaten pTFE graft  Patient without complaint this AM. Denies any bilateral upper extremity pain.   Objective: Vitals:   08/16/19 1353 08/16/19 2140 08/17/19 0447 08/17/19 0722  BP: (!) 141/92 116/64 140/86 127/71  Pulse: 79 80 82 86  Resp: 19 20 16 18   Temp: 98.5 F (36.9 C) 97.9 F (36.6 C) 98.3 F (36.8 C) 98.8 F (37.1 C)  TempSrc: Oral Oral    SpO2: 100% 97% 98% 99%  Weight:   43.5 kg   Height:        Intake/Output Summary (Last 24 hours) at 08/17/2019 1050 Last data filed at 08/17/2019 0951 Gross per 24 hour  Intake 240 ml  Output -  Net 240 ml   Physical Exam: A&Ox3, NAD Chest:   Right IJ permcath: clean, dry and intact CV: RRR Pulmonary: CTA Bilaterally Abdomen: Soft, Nontender, Nondistended Vascular:  Right Upper Extremity: Proximal incision - clean, dry, and intact.  Distal incision - clean, dry, and intact.  No signs of infection.  Extremities warm distally to fingers.  Hand is warm.  Good bruit/thrill in graft.   Document Information Photos    08/17/2019 09:39  Attached To:  Hospital Encounter on 08/03/19  Source Information Teja Judice, Janalyn Harder, PA-C  [facility]-Telemetry (2a)    Left upper extremity: Extremity soft.  No pulsatility noted in aneurysmal aspect of access.  Extremity warm distally to fingers.   Hand is warm.  Laboratory: CBC    Component Value Date/Time   WBC 9.7 08/13/2019 0627   HGB 8.8 (L) 08/13/2019 0627   HCT 27.9 (L) 08/13/2019 0627   PLT 162 08/13/2019 0627   BMET    Component Value Date/Time   NA 138 08/17/2019 0813   K 3.3 (L) 08/17/2019 0813   CL 97 (L) 08/17/2019 0813   CO2 27 08/17/2019 0813   GLUCOSE 106 (H) 08/17/2019 0813   BUN 34 (H) 08/17/2019 0813   CREATININE 3.79 (H) 08/17/2019 0813   CALCIUM 8.7 (L) 08/17/2019 0813   GFRNONAA 18 (L) 08/17/2019 0813   GFRAA 21 (L) 08/17/2019 0813   Assessment/Planning: The patient is a 43 year old male with multiple medical issues including end-stage renal disease status post right brachial axillary graft creation with dehiscence of distal incision status post washout and closure, 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 1) both incisions located to the right upper extremity are clean dry and intact. (see pictures). No signs of infection noted.  Continue to cover distal incision lightly with Coban or Kerlix.  2) successful placement of covered stent to patient's left upper extremity aneurysm. 3) patient to continue to dialyze through right IJ PermCath until graft has matured. 4) recommend follow-up in one week for incision check.  Placed in chart.  Discussed with Dr. Ellis Parents Acxel Dingee PA-C 08/17/2019 10:50 AM

## 2019-08-17 NOTE — Progress Notes (Signed)
Brett Small, Father, would like for physician to call with update on son this morning: [phone]

## 2019-08-17 NOTE — Discharge Instructions (Signed)
Acute Respiratory Failure, Adult ° °Acute respiratory failure occurs when there is not enough oxygen passing from your lungs to your body. When this happens, your lungs have trouble removing carbon dioxide from the blood. This causes your blood oxygen level to drop too low as carbon dioxide builds up. °Acute respiratory failure is a medical emergency. It can develop quickly, but it is temporary if treated promptly. Your lung capacity, or how much air your lungs can hold, may improve with time, exercise, and treatment. °What are the causes? °There are many possible causes of acute respiratory failure, including: °· Lung injury. °· Chest injury or damage to the ribs or tissues near the lungs. °· Lung conditions that affect the flow of air and blood into and out of the lungs, such as pneumonia, acute respiratory distress syndrome, and cystic fibrosis. °· Medical conditions, such as strokes or spinal cord injuries, that affect the muscles and nerves that control breathing. °· Blood infection (sepsis). °· Inflammation of the pancreas (pancreatitis). °· A blood clot in the lungs (pulmonary embolism). °· A large-volume blood transfusion. °· Burns. °· Near-drowning. °· Seizure. °· Smoke inhalation. °· Reaction to medicines. °· Alcohol or drug overdose. °What increases the risk? °This condition is more likely to develop in people who have: °· A blocked airway. °· Asthma. °· A condition or disease that damages or weakens the muscles, nerves, bones, or tissues that are involved in breathing. °· A serious infection. °· A health problem that blocks the unconscious reflex that is involved in breathing, such as hypothyroidism or sleep apnea. °· A lung injury or trauma. °What are the signs or symptoms? °Trouble breathing is the main symptom of acute respiratory failure. Symptoms may also include: °· Rapid breathing. °· Restlessness or anxiety. °· Skin, lips, or fingernails that appear blue (cyanosis). °· Rapid heart  rate. °· Abnormal heart rhythms (arrhythmias). °· Confusion or changes in behavior. °· Tiredness or loss of energy. °· Feeling sleepy or having a loss of consciousness. °How is this diagnosed? °Your health care provider can diagnose acute respiratory failure with a medical history and physical exam. During the exam, your health care provider will listen to your heart and check for crackling or wheezing sounds in your lungs. Your may also have tests to confirm the diagnosis and determine what is causing respiratory failure. These tests may include: °· Measuring the amount of oxygen in your blood (pulse oximetry). The measurement comes from a small device that is placed on your finger, earlobe, or toe. °· Other blood tests to measure blood gases and to look for signs of infection. °· Sampling your cerebral spinal fluid or tracheal fluid to check for infections. °· Chest X-ray to look for fluid in spaces that should be filled with air. °· Electrocardiogram (ECG) to look at the heart's electrical activity. °How is this treated? °Treatment for this condition usually takes places in a hospital intensive care unit (ICU). Treatment depends on what is causing the condition. It may include one or more treatments until your symptoms improve. Treatment may include: °· Supplemental oxygen. Extra oxygen is given through a tube in the nose, a face mask, or a hood. °· A device such as a continuous positive airway pressure (CPAP) or bi-level positive airway pressure (BiPAP or BPAP) machine. This treatment uses mild air pressure to keep the airways open. A mask or other device will be placed over your nose or mouth. A tube that is connected to a motor will deliver oxygen through the   mask. °· Ventilator. This treatment helps move air into and out of the lungs. This may be done with a bag and mask or a machine. For this treatment, a tube is placed in your windpipe (trachea) so air and oxygen can flow to the lungs. °· Extracorporeal  membrane oxygenation (ECMO). This treatment temporarily takes over the function of the heart and lungs, supplying oxygen and removing carbon dioxide. ECMO gives the lungs a chance to recover. It may be used if a ventilator is not effective. °· Tracheostomy. This is a procedure that creates a hole in the neck to insert a breathing tube. °· Receiving fluids and medicines. °· Rocking the bed to help breathing. °Follow these instructions at home: °· Take over-the-counter and prescription medicines only as told by your health care provider. °· Return to normal activities as told by your health care provider. Ask your health care provider what activities are safe for you. °· Keep all follow-up visits as told by your health care provider. This is important. °How is this prevented? °Treating infections and medical conditions that may lead to acute respiratory failure can help prevent the condition from developing. °Contact a health care provider if: °· You have a fever. °· Your symptoms do not improve or they get worse. °Get help right away if: °· You are having trouble breathing. °· You lose consciousness. °· Your have cyanosis or turn blue. °· You develop a rapid heart rate. °· You are confused. °These symptoms may represent a serious problem that is an emergency. Do not wait to see if the symptoms will go away. Get medical help right away. Call your local emergency services (911 in the [place] Do not drive yourself to the hospital. °This information is not intended to replace advice given to you by your health care provider. Make sure you discuss any questions you have with your health care provider. °Document Released: 10/13/2013 Document Revised: 09/20/2017 Document Reviewed: 04/25/2016 °Elsevier Patient Education © 2020 [employer] ° °

## 2019-08-18 ENCOUNTER — Encounter: Payer: Self-pay | Admitting: Vascular Surgery

## 2019-08-20 ENCOUNTER — Telehealth (INDEPENDENT_AMBULATORY_CARE_PROVIDER_SITE_OTHER): Payer: Self-pay

## 2019-08-21 ENCOUNTER — Other Ambulatory Visit (INDEPENDENT_AMBULATORY_CARE_PROVIDER_SITE_OTHER): Payer: Self-pay | Admitting: Nurse Practitioner

## 2019-08-21 MED ORDER — CEPHALEXIN 500 MG PO CAPS: 500.0000 mg | ORAL_CAPSULE | Freq: Two times a day (BID) | ORAL | 0 refills | Status: DC

## 2019-08-21 NOTE — Telephone Encounter (Signed)
We will send keflex in to the patient's pharmacy due to concern for infection.  We will take a look when we see patient on Monday.

## 2019-08-24 ENCOUNTER — Ambulatory Visit (INDEPENDENT_AMBULATORY_CARE_PROVIDER_SITE_OTHER): Payer: Medicare Other | Admitting: Nurse Practitioner

## 2019-08-24 ENCOUNTER — Encounter: Payer: Self-pay | Admitting: Emergency Medicine

## 2019-08-24 ENCOUNTER — Inpatient Hospital Stay
Admission: EM | Admit: 2019-08-24 | Discharge: 2019-08-28 | DRG: 252 | Disposition: A | Discharge status: Home Health | Payer: Medicare Other | Attending: Family Medicine | Admitting: Family Medicine

## 2019-08-24 ENCOUNTER — Emergency Department: Payer: Medicare Other

## 2019-08-24 ENCOUNTER — Other Ambulatory Visit (INDEPENDENT_AMBULATORY_CARE_PROVIDER_SITE_OTHER): Payer: Self-pay | Admitting: Vascular Surgery

## 2019-08-24 ENCOUNTER — Other Ambulatory Visit: Payer: Self-pay

## 2019-08-24 DIAGNOSIS — E875 Hyperkalemia: Secondary | ICD-10-CM

## 2019-08-24 DIAGNOSIS — Z79899 Other long term (current) drug therapy: Secondary | ICD-10-CM

## 2019-08-24 DIAGNOSIS — T82510A Breakdown (mechanical) of surgically created arteriovenous fistula, initial encounter: Secondary | ICD-10-CM | POA: Diagnosis present

## 2019-08-24 DIAGNOSIS — Z20828 Contact with and (suspected) exposure to other viral communicable diseases: Secondary | ICD-10-CM | POA: Diagnosis present

## 2019-08-24 DIAGNOSIS — F1290 Cannabis use, unspecified, uncomplicated: Secondary | ICD-10-CM | POA: Diagnosis present

## 2019-08-24 DIAGNOSIS — I10 Essential (primary) hypertension: Secondary | ICD-10-CM | POA: Diagnosis present

## 2019-08-24 DIAGNOSIS — T8131XS Disruption of external operation (surgical) wound, not elsewhere classified, sequela: Secondary | ICD-10-CM

## 2019-08-24 DIAGNOSIS — E785 Hyperlipidemia, unspecified: Secondary | ICD-10-CM | POA: Diagnosis present

## 2019-08-24 DIAGNOSIS — T829XXD Unspecified complication of cardiac and vascular prosthetic device, implant and graft, subsequent encounter: Secondary | ICD-10-CM | POA: Diagnosis not present

## 2019-08-24 DIAGNOSIS — T82528A Displacement of other cardiac and vascular devices and implants, initial encounter: Secondary | ICD-10-CM

## 2019-08-24 DIAGNOSIS — R601 Generalized edema: Secondary | ICD-10-CM

## 2019-08-24 DIAGNOSIS — R7402 Elevation of levels of lactic acid dehydrogenase (LDH): Secondary | ICD-10-CM | POA: Diagnosis not present

## 2019-08-24 DIAGNOSIS — K7581 Nonalcoholic steatohepatitis (NASH): Secondary | ICD-10-CM | POA: Diagnosis present

## 2019-08-24 DIAGNOSIS — I132 Hypertensive heart and chronic kidney disease with heart failure and with stage 5 chronic kidney disease, or end stage renal disease: Secondary | ICD-10-CM | POA: Diagnosis present

## 2019-08-24 DIAGNOSIS — D631 Anemia in chronic kidney disease: Secondary | ICD-10-CM | POA: Diagnosis present

## 2019-08-24 DIAGNOSIS — Y712 Prosthetic and other implants, materials and accessory cardiovascular devices associated with adverse incidents: Secondary | ICD-10-CM | POA: Diagnosis present

## 2019-08-24 DIAGNOSIS — T8241XA Breakdown (mechanical) of vascular dialysis catheter, initial encounter: Secondary | ICD-10-CM | POA: Diagnosis present

## 2019-08-24 DIAGNOSIS — R52 Pain, unspecified: Secondary | ICD-10-CM

## 2019-08-24 DIAGNOSIS — Z8249 Family history of ischemic heart disease and other diseases of the circulatory system: Secondary | ICD-10-CM

## 2019-08-24 DIAGNOSIS — T8131XA Disruption of external operation (surgical) wound, not elsewhere classified, initial encounter: Secondary | ICD-10-CM | POA: Diagnosis present

## 2019-08-24 DIAGNOSIS — T827XXA Infection and inflammatory reaction due to other cardiac and vascular devices, implants and grafts, initial encounter: Secondary | ICD-10-CM | POA: Diagnosis present

## 2019-08-24 DIAGNOSIS — T8130XA Disruption of wound, unspecified, initial encounter: Secondary | ICD-10-CM | POA: Diagnosis not present

## 2019-08-24 DIAGNOSIS — F322 Major depressive disorder, single episode, severe without psychotic features: Secondary | ICD-10-CM | POA: Diagnosis not present

## 2019-08-24 DIAGNOSIS — N186 End stage renal disease: Secondary | ICD-10-CM | POA: Diagnosis present

## 2019-08-24 DIAGNOSIS — N2581 Secondary hyperparathyroidism of renal origin: Secondary | ICD-10-CM | POA: Diagnosis present

## 2019-08-24 DIAGNOSIS — D6959 Other secondary thrombocytopenia: Secondary | ICD-10-CM | POA: Diagnosis present

## 2019-08-24 DIAGNOSIS — I739 Peripheral vascular disease, unspecified: Secondary | ICD-10-CM | POA: Diagnosis present

## 2019-08-24 DIAGNOSIS — Z7982 Long term (current) use of aspirin: Secondary | ICD-10-CM

## 2019-08-24 DIAGNOSIS — Z9862 Peripheral vascular angioplasty status: Secondary | ICD-10-CM | POA: Diagnosis not present

## 2019-08-24 DIAGNOSIS — Z9115 Patient's noncompliance with renal dialysis: Secondary | ICD-10-CM | POA: Diagnosis not present

## 2019-08-24 DIAGNOSIS — I509 Heart failure, unspecified: Secondary | ICD-10-CM | POA: Diagnosis not present

## 2019-08-24 DIAGNOSIS — T82898A Other specified complication of vascular prosthetic devices, implants and grafts, initial encounter: Secondary | ICD-10-CM | POA: Diagnosis not present

## 2019-08-24 DIAGNOSIS — E440 Moderate protein-calorie malnutrition: Secondary | ICD-10-CM

## 2019-08-24 DIAGNOSIS — Z992 Dependence on renal dialysis: Secondary | ICD-10-CM | POA: Diagnosis not present

## 2019-08-24 DIAGNOSIS — I5022 Chronic systolic (congestive) heart failure: Secondary | ICD-10-CM

## 2019-08-24 DIAGNOSIS — Z9119 Patient's noncompliance with other medical treatment and regimen: Secondary | ICD-10-CM | POA: Diagnosis not present

## 2019-08-24 DIAGNOSIS — Z720 Tobacco use: Secondary | ICD-10-CM | POA: Diagnosis present

## 2019-08-24 DIAGNOSIS — E8770 Fluid overload, unspecified: Secondary | ICD-10-CM | POA: Diagnosis present

## 2019-08-24 DIAGNOSIS — I252 Old myocardial infarction: Secondary | ICD-10-CM

## 2019-08-24 DIAGNOSIS — K219 Gastro-esophageal reflux disease without esophagitis: Secondary | ICD-10-CM | POA: Diagnosis present

## 2019-08-24 DIAGNOSIS — Z95820 Peripheral vascular angioplasty status with implants and grafts: Secondary | ICD-10-CM

## 2019-08-24 DIAGNOSIS — Z8674 Personal history of sudden cardiac arrest: Secondary | ICD-10-CM | POA: Diagnosis not present

## 2019-08-24 DIAGNOSIS — I340 Nonrheumatic mitral (valve) insufficiency: Secondary | ICD-10-CM | POA: Diagnosis present

## 2019-08-24 DIAGNOSIS — F17210 Nicotine dependence, cigarettes, uncomplicated: Secondary | ICD-10-CM | POA: Diagnosis present

## 2019-08-24 DIAGNOSIS — G40909 Epilepsy, unspecified, not intractable, without status epilepticus: Secondary | ICD-10-CM | POA: Diagnosis present

## 2019-08-24 DIAGNOSIS — T829XXA Unspecified complication of cardiac and vascular prosthetic device, implant and graft, initial encounter: Secondary | ICD-10-CM | POA: Diagnosis present

## 2019-08-24 DIAGNOSIS — Z885 Allergy status to narcotic agent status: Secondary | ICD-10-CM

## 2019-08-24 DIAGNOSIS — F329 Major depressive disorder, single episode, unspecified: Secondary | ICD-10-CM | POA: Diagnosis present

## 2019-08-24 DIAGNOSIS — Z833 Family history of diabetes mellitus: Secondary | ICD-10-CM

## 2019-08-24 DIAGNOSIS — Z882 Allergy status to sulfonamides status: Secondary | ICD-10-CM

## 2019-08-24 DIAGNOSIS — Z7901 Long term (current) use of anticoagulants: Secondary | ICD-10-CM

## 2019-08-24 DIAGNOSIS — D696 Thrombocytopenia, unspecified: Secondary | ICD-10-CM | POA: Diagnosis not present

## 2019-08-24 DIAGNOSIS — Z832 Family history of diseases of the blood and blood-forming organs and certain disorders involving the immune mechanism: Secondary | ICD-10-CM

## 2019-08-24 DIAGNOSIS — R7401 Elevation of levels of liver transaminase levels: Secondary | ICD-10-CM | POA: Diagnosis not present

## 2019-08-24 DIAGNOSIS — Z6820 Body mass index (BMI) 20.0-20.9, adult: Secondary | ICD-10-CM

## 2019-08-24 LAB — CBC WITH DIFFERENTIAL/PLATELET
Abs Immature Granulocytes: 0.08 10*3/uL — ABNORMAL HIGH (ref 0.00–0.07)
Basophils Absolute: 0 10*3/uL (ref 0.0–0.1)
Basophils Relative: 0 %
Eosinophils Absolute: 0 10*3/uL (ref 0.0–0.5)
Eosinophils Relative: 0 %
HCT: 32.1 % — ABNORMAL LOW (ref 39.0–52.0)
Hemoglobin: 10.1 g/dL — ABNORMAL LOW (ref 13.0–17.0)
Immature Granulocytes: 1 %
Lymphocytes Relative: 9 %
Lymphs Abs: 1.1 10*3/uL (ref 0.7–4.0)
MCH: 32.5 pg (ref 26.0–34.0)
MCHC: 31.5 g/dL (ref 30.0–36.0)
MCV: 103.2 fL — ABNORMAL HIGH (ref 80.0–100.0)
Monocytes Absolute: 0.9 10*3/uL (ref 0.1–1.0)
Monocytes Relative: 8 %
Neutro Abs: 10.2 10*3/uL — ABNORMAL HIGH (ref 1.7–7.7)
Neutrophils Relative %: 82 %
Platelets: 51 10*3/uL — ABNORMAL LOW (ref 150–400)
RBC: 3.11 MIL/uL — ABNORMAL LOW (ref 4.22–5.81)
RDW: 20.1 % — ABNORMAL HIGH (ref 11.5–15.5)
WBC: 12.4 10*3/uL — ABNORMAL HIGH (ref 4.0–10.5)
nRBC: 0.6 % — ABNORMAL HIGH (ref 0.0–0.2)

## 2019-08-24 LAB — COMPREHENSIVE METABOLIC PANEL
ALT: 515 U/L — ABNORMAL HIGH (ref 0–44)
AST: 475 U/L — ABNORMAL HIGH (ref 15–41)
Albumin: 3.3 g/dL — ABNORMAL LOW (ref 3.5–5.0)
Alkaline Phosphatase: 137 U/L — ABNORMAL HIGH (ref 38–126)
Anion gap: 27 — ABNORMAL HIGH (ref 5–15)
BUN: 124 mg/dL — ABNORMAL HIGH (ref 6–20)
CO2: 13 mmol/L — ABNORMAL LOW (ref 22–32)
Calcium: 9 mg/dL (ref 8.9–10.3)
Chloride: 93 mmol/L — ABNORMAL LOW (ref 98–111)
Creatinine, Ser: 8.14 mg/dL — ABNORMAL HIGH (ref 0.61–1.24)
GFR calc Af Amer: 8 mL/min — ABNORMAL LOW (ref >60)
GFR calc non Af Amer: 7 mL/min — ABNORMAL LOW (ref >60)
Glucose, Bld: 129 mg/dL — ABNORMAL HIGH (ref 70–99)
Potassium: 6.1 mmol/L — ABNORMAL HIGH (ref 3.5–5.1)
Sodium: 133 mmol/L — ABNORMAL LOW (ref 135–145)
Total Bilirubin: 3.2 mg/dL — ABNORMAL HIGH (ref 0.3–1.2)
Total Protein: 6.3 g/dL — ABNORMAL LOW (ref 6.5–8.1)

## 2019-08-24 LAB — PATHOLOGIST SMEAR REVIEW

## 2019-08-24 LAB — GLUCOSE, CAPILLARY: Glucose-Capillary: 80 mg/dL (ref 70–99)

## 2019-08-24 LAB — SARS CORONAVIRUS 2 BY RT PCR (HOSPITAL ORDER, PERFORMED IN ~~LOC~~ HOSPITAL LAB): SARS Coronavirus 2: NEGATIVE

## 2019-08-24 LAB — TROPONIN I (HIGH SENSITIVITY): Troponin I (High Sensitivity): 79 ng/L — ABNORMAL HIGH (ref <18)

## 2019-08-24 MED ORDER — ONDANSETRON HCL 4 MG PO TABS
4.0000 mg | ORAL_TABLET | Freq: Four times a day (QID) | ORAL | Status: DC | PRN
  Administered 2019-08-26: 4 mg via ORAL
  Filled 2019-08-24 (×2): qty 1

## 2019-08-24 MED ORDER — INSULIN ASPART 100 UNIT/ML ~~LOC~~ SOLN
10.0000 [IU] | Freq: Once | SUBCUTANEOUS | Status: AC
  Administered 2019-08-24: 10 [IU] via INTRAVENOUS
  Filled 2019-08-24: qty 1

## 2019-08-24 MED ORDER — SODIUM CHLORIDE 0.9 % IV SOLN
1.0000 g | Freq: Once | INTRAVENOUS | Status: AC
  Administered 2019-08-24: 1 g via INTRAVENOUS
  Filled 2019-08-24: qty 10

## 2019-08-24 MED ORDER — HYDROMORPHONE HCL 1 MG/ML IJ SOLN
0.5000 mg | Freq: Once | INTRAMUSCULAR | Status: AC
  Administered 2019-08-24: 14:00:00 0.5 mg via INTRAVENOUS
  Filled 2019-08-24: qty 1

## 2019-08-24 MED ORDER — VANCOMYCIN HCL IN DEXTROSE 1-5 GM/200ML-% IV SOLN
1000.0000 mg | Freq: Once | INTRAVENOUS | Status: AC
  Administered 2019-08-24: 13:00:00 1000 mg via INTRAVENOUS
  Filled 2019-08-24: qty 200

## 2019-08-24 MED ORDER — ACETAMINOPHEN 650 MG RE SUPP: 650.0000 mg | Freq: Four times a day (QID) | RECTAL | Status: DC | PRN

## 2019-08-24 MED ORDER — ACETAMINOPHEN 325 MG PO TABS: 650.0000 mg | ORAL_TABLET | Freq: Four times a day (QID) | ORAL | Status: DC | PRN

## 2019-08-24 MED ORDER — CHLORHEXIDINE GLUCONATE CLOTH 2 % EX PADS
6.0000 | MEDICATED_PAD | Freq: Every day | CUTANEOUS | Status: DC
  Administered 2019-08-25 – 2019-08-26 (×2): 6 via TOPICAL

## 2019-08-24 MED ORDER — SODIUM BICARBONATE 8.4 % IV SOLN
50.0000 meq | Freq: Once | INTRAVENOUS | Status: AC
  Administered 2019-08-24: 15:00:00 50 meq via INTRAVENOUS
  Filled 2019-08-24: qty 50

## 2019-08-24 MED ORDER — ONDANSETRON HCL 4 MG/2ML IJ SOLN
4.0000 mg | Freq: Four times a day (QID) | INTRAMUSCULAR | Status: DC | PRN
  Administered 2019-08-26: 4 mg via INTRAVENOUS

## 2019-08-24 MED ORDER — NEPRO/CARBSTEADY PO LIQD
237.0000 mL | Freq: Two times a day (BID) | ORAL | Status: DC
  Administered 2019-08-25 – 2019-08-28 (×2): 237 mL via ORAL

## 2019-08-24 MED ORDER — LEVETIRACETAM 500 MG PO TABS
500.0000 mg | ORAL_TABLET | Freq: Two times a day (BID) | ORAL | Status: DC
  Administered 2019-08-25 – 2019-08-28 (×6): 500 mg via ORAL
  Filled 2019-08-24 (×10): qty 1

## 2019-08-24 MED ORDER — ONDANSETRON HCL 4 MG/2ML IJ SOLN
4.0000 mg | Freq: Once | INTRAMUSCULAR | Status: AC
  Administered 2019-08-24: 4 mg via INTRAVENOUS
  Filled 2019-08-24: qty 2

## 2019-08-24 MED ORDER — ONDANSETRON 4 MG PO TBDP
ORAL_TABLET | ORAL | Status: AC
  Filled 2019-08-24: qty 1

## 2019-08-24 MED ORDER — DEXTROSE 50 % IV SOLN
25.0000 g | Freq: Once | INTRAVENOUS | Status: AC
  Administered 2019-08-24: 25 g via INTRAVENOUS
  Filled 2019-08-24: qty 50

## 2019-08-24 NOTE — ED Provider Notes (Addendum)
[facility] Emergency Department Provider Note       Time seen: ----------------------------------------- 12:24 PM on 08/24/2019 -----------------------------------------   I have reviewed the triage vital signs and the nursing notes.  HISTORY   Chief Complaint Arm Swelling    HPI Brett Small is a 43 y.o. male with a history of end-stage renal disease on dialysis, hypertension, peripheral vascular disease who presents to the ED for recheck of a wound on his right arm.  Upon arrival his right arm appears swollen with edema as noted by the triage nurse.  He has wound dehiscence around sutures that were placed in his right AC.  Patient states wound has been draining for about a week.  Has a history of cardiac arrest last month.  He missed his last 2 dialysis treatments.  Past Medical History:  Diagnosis Date  . ESRD (end stage renal disease) ([place])   . Hypertension   . PAD (peripheral artery disease) ([facility])    Required aortofemoral stent-which had closed and had to redo the procedure and ischemia of limb.  . Peripheral vascular disease ([place])   . Renal disorder   . Secondary hyperparathyroidism of renal origin [facility])     Patient Active Problem List   Diagnosis Date Noted  . Seizure ([place])   . Malnutrition of moderate degree 08/05/2019  . Acute CHF (congestive heart failure) ([place]) 08/04/2019  . Pulmonary edema 07/27/2019  . Pleural effusion 07/20/2019  . NSTEMI (non-ST elevated myocardial infarction) ([place]) 07/09/2019  . Pneumonia 07/02/2019  . Acute liver failure 05/19/2019  . Hematemesis 05/19/2019  . Hepatitis   . Thrombocytopenia ([place])   . Coagulopathy ([place])   . Sepsis ([place]) 02/28/2019  . Lobar pneumonia ([place]) 02/28/2019  . Acute respiratory failure ([place]) 02/04/2019  . Acute respiratory failure with hypoxemia ([place]) 01/27/2019  . Depression 01/07/2019  . MDD (major depressive disorder), single episode, severe , no psychosis ([place])   .  Homelessness   . Acute respiratory failure with hypoxia ([place]) 12/25/2018  . End stage renal disease on dialysis ([place]) 12/25/2018  . Hypertension 12/25/2018  . Renal osteodystrophy 12/25/2018    Past Surgical History:  Procedure Laterality Date  . A/V SHUNT INTERVENTION Left 01/19/2019   Procedure: LEFT UPPER EXTREMITY A/V SHUNTOGRAM / UPPER EXTREMITY ANGIOGRAM;  Surgeon: Algernon Huxley, MD;  Location: [place] [facility];  Service: Cardiovascular;  Laterality: Left;  . A/V SHUNT INTERVENTION Left 03/02/2019   Procedure: A/V SHUNT INTERVENTION;  Surgeon: Algernon Huxley, MD;  Location: [place] [facility];  Service: Cardiovascular;  Laterality: Left;  . AORTA - FEMORAL ARTERY BYPASS GRAFT    . AV FISTULA PLACEMENT Left 12/26/2018   Procedure: INSERTION OF GORE STRETCH VASCULAR 4-7MM X  45CM IN LEFT UPPER ARM;  Surgeon: Marty Heck, MD;  Location: [facility];  Service: Vascular;  Laterality: Left;  . AV FISTULA PLACEMENT Right 08/12/2019   Procedure: INSERTION OF ARTERIOVENOUS (AV) GORE-TEX GRAFT ARM;  Surgeon: Algernon Huxley, MD;  Location: [facility];  Service: Vascular;  Laterality: Right;  . DIALYSIS/PERMA CATHETER REMOVAL N/A 02/06/2019   Procedure: DIALYSIS/PERMA CATHETER REMOVAL;  Surgeon: Algernon Huxley, MD;  Location: [place] [facility];  Service: Cardiovascular;  Laterality: N/A;  . LEFT HEART CATH AND CORONARY ANGIOGRAPHY Right 07/10/2019   Procedure: LEFT HEART CATH AND CORONARY ANGIOGRAPHY;  Surgeon: Dionisio David, MD;  Location: [place] [facility];  Service: Cardiovascular;  Laterality: Right;  . PERIPHERAL VASCULAR THROMBECTOMY Left 07/28/2019  Procedure: Left Upper Extremity Dialysis Access [place];  Surgeon: Katha Cabal, MD;  Location: [place] [facility];  Service: Cardiovascular;  Laterality: Left;  . UPPER EXTREMITY ANGIOGRAPHY Left 08/13/2019   Procedure: UPPER EXTREMITY ANGIOGRAPHy;  Surgeon: Algernon Huxley, MD;  Location: [place] [facility];  Service: Cardiovascular;   Laterality: Left;    Allergies Codeine and Sulfa antibiotics  Social History Social History   Tobacco Use  . Smoking status: Current Every Day Smoker    Packs/day: 0.50    Years: 30.00    Pack years: 15.00    Types: Cigarettes    Last attempt to quit: 06/26/2018    Years since quitting: 1.1  . Smokeless tobacco: Never Used  . Tobacco comment: smoked for 30 years   Substance Use Topics  . Alcohol use: Not Currently  . Drug use: Yes    Frequency: 1.0 times per week    Types: Marijuana    Comment: Pt states "maybe once or twice a week".    Review of Systems Constitutional: Negative for fever. Cardiovascular: Negative for chest pain. Respiratory: Negative for shortness of breath. Gastrointestinal: Negative for abdominal pain, vomiting and diarrhea. Musculoskeletal: Positive right arm pain and swelling Skin: Positive for right arm antecubital wound Neurological: Negative for headaches, focal weakness or numbness.  All systems negative/normal/unremarkable except as stated in the HPI  ____________________________________________   PHYSICAL EXAM:  VITAL SIGNS: ED Triage Vitals  Enc Vitals Group     BP 08/24/19 1156 (!) 192/127     Pulse Rate 08/24/19 1151 97     Resp 08/24/19 1151 16     Temp --      Temp src --      SpO2 08/24/19 1151 100 %     Weight 08/24/19 1153 105 lb (47.6 kg)     Height 08/24/19 1153 5\' 1"  (1.549 m)     Head Circumference --      Peak Flow --      Pain Score 08/24/19 1153 6     Pain Loc --      Pain Edu? --      Excl. in [place]? --    Constitutional: Alert and oriented.  Chronically ill-appearing, no distress Eyes: Conjunctivae are normal. Normal extraocular movements. Cardiovascular: Normal rate, regular rhythm. No murmurs, rubs, or gallops. Respiratory: Normal respiratory effort without tachypnea nor retractions. Breath sounds are clear and equal bilaterally. No wheezes/rales/rhonchi. Gastrointestinal: Soft and nontender. Normal bowel  sounds Musculoskeletal: Mild edema in the right upper extremity Neurologic:  Normal speech and language. No gross focal neurologic deficits are appreciated.  Skin: Patient has wound dehiscence in the right AC, sutures are still in place over the medial and lateral aspects of the wound, but the middle has dehisced.  There is some purulent drainage noted Psychiatric: Mood and affect are normal. Speech and behavior are normal.  ____________________________________________  ED COURSE:  As part of my medical decision making, I reviewed the following data within the [place] History obtained from family if available, nursing notes, old chart and ekg, as well as notes from prior ED visits. Patient presented for a wound check and having missed dialysis, we will assess with labs and imaging as indicated at this time. Clinical Course as of Aug 24 1447  Mon Aug 24, 2019  1434 Polychromasia: PRESENT [JW]    Clinical Course User Index [JW] Earleen Newport, MD   Procedures  Brett Small was evaluated in Emergency Department on  08/24/2019 for the symptoms described in the history of present illness. He was evaluated in the context of the global COVID-19 pandemic, which necessitated consideration that the patient might be at risk for infection with the SARS-CoV-2 virus that causes COVID-19. Institutional protocols and algorithms that pertain to the evaluation of patients at risk for COVID-19 are in a state of rapid change based on information released by regulatory bodies including the CDC and federal and state organizations. These policies and algorithms were followed during the patient's care in the ED.  ____________________________________________   LABS (pertinent positives/negatives)  Labs Reviewed  CBC WITH DIFFERENTIAL/PLATELET - Abnormal; Notable for the following components:      Result Value   WBC 12.4 (*)    RBC 3.11 (*)    Hemoglobin 10.1 (*)    HCT 32.1 (*)     MCV 103.2 (*)    RDW 20.1 (*)    Platelets 51 (*)    nRBC 0.6 (*)    Neutro Abs 10.2 (*)    Abs Immature Granulocytes 0.08 (*)    All other components within normal limits  COMPREHENSIVE METABOLIC PANEL - Abnormal; Notable for the following components:   Sodium 133 (*)    Potassium 6.1 (*)    Chloride 93 (*)    CO2 13 (*)    Glucose, Bld 129 (*)    BUN 124 (*)    Creatinine, Ser 8.14 (*)    Total Protein 6.3 (*)    Albumin 3.3 (*)    AST 475 (*)    ALT 515 (*)    Alkaline Phosphatase 137 (*)    Total Bilirubin 3.2 (*)    GFR calc non Af Amer 7 (*)    GFR calc Af Amer 8 (*)    Anion gap 27 (*)    All other components within normal limits  AEROBIC/ANAEROBIC CULTURE (SURGICAL/DEEP WOUND)  SARS CORONAVIRUS 2 BY RT PCR (HOSPITAL ORDER, [place] [facility])  PATHOLOGIST SMEAR REVIEW  TROPONIN I (HIGH SENSITIVITY)   CRITICAL CARE Performed by: Laurence Aly   Total critical care time: 30 minutes  Critical care time was exclusive of separately billable procedures and treating other patients.  Critical care was necessary to treat or prevent imminent or life-threatening deterioration.  Critical care was time spent personally by me on the following activities: development of treatment plan with patient and/or surrogate as well as nursing, discussions with consultants, evaluation of patient's response to treatment, examination of patient, obtaining history from patient or surrogate, ordering and performing treatments and interventions, ordering and review of laboratory studies, ordering and review of radiographic studies, pulse oximetry and re-evaluation of patient's condition.  RADIOLOGY  Right upper extremity ultrasound IMPRESSION: 1. Positive for acute occlusive deep venous thrombosis in 1 of the paired radial veins in the forearm. 2. Large complex fluid collection surrounding the arteriovenous graft in the axilla. Differential considerations include  hematoma and abscess. 3. The arteriovenous graft remains patent. No evidence of internal thrombus. ____________________________________________   DIFFERENTIAL DIAGNOSIS   Cellulitis, abscess, wound dehiscence, medication noncompliance, end-stage renal disease on dialysis, electrolyte abnormality  FINAL ASSESSMENT AND PLAN  Right upper extremity wound dehiscence, end-stage renal disease on dialysis, DVT   Plan: The patient had presented for wound recheck.  Patient with wound dehiscence in the right AC.  I have discussed with vascular surgery who will come and evaluate the patient in the ER and likely admit for surgical washout.  I have also discussed  with nephrology due to his end-stage renal disease and needing dialysis.  He was treated with insulin, D50, sodium bicarb and calcium.  He was also given IV vancomycin to cover for wound infection.   Laurence Aly, MD    Note: This note was generated in part or whole with voice recognition software. Voice recognition is usually quite accurate but there are transcription errors that can and very often do occur. I apologize for any typographical errors that were not detected and corrected.     Earleen Newport, MD 08/24/19 1250    Earleen Newport, MD 08/24/19 [phone]

## 2019-08-24 NOTE — ED Notes (Signed)
Pt given phone and talking to friend.

## 2019-08-24 NOTE — ED Notes (Signed)
Provider at bedside

## 2019-08-24 NOTE — ED Notes (Signed)
Patient transported to Ultrasound 

## 2019-08-24 NOTE — ED Triage Notes (Signed)
Pt to ED via POV for recheck of wound on his right arm. Upon arrival pts arm noted to be swollen, pt has 3+ pitting edema in his right arm, cap refill less than 3 seconds, color and temperature WNL. Pt has open hole in his right AC. Pt states that he had an abscess and it was closed but a few days ago the wound opened back up. Pt states that it has been draining since 10/26. Pt has recent hx/o PEA arrest on 10/14. Pt is a dialysis pt and states that he has missed treatment recently. Last treatment was last week on Tuesday or Thursday. Pt states that he did not have a ride to go.

## 2019-08-24 NOTE — H&P (Signed)
History and Physical  Brett Small [id number] DOB: 11/15/1975 DOA: 08/24/2019  Referring physician: Lenise Arena, ER physician PCP: Patient, [name]  Outpatient Specialists: Follows with vascular surgery and nephrology as outpatient Patient coming from: Home & is able to ambulate with a walker  Chief Complaint: Arm wound draining  HPI: Brett Small is a 43 y.o. male with medical history significant for peripheral vascular disease, major depressive disorder and end-stage renal disease of which him he will often miss dialysis sessions.  The patient was just discharged from the hospital on 10/28 after missing several dialysis sessions and came in as a PEA arrest.  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  Patient was put on daily dressing changes and was to follow-up with vascular surgery as an outpatient. ED Course: Patient presented to the emergency room today with complaints of draining from his right arm wound.  He states is been draining now for about a week.  He also stated that he has missed his last 2 dialysis treatments.  Labs were done noting a mildly elevated white blood cell count of 12.4, BUN of 124/creatinine of 8.14 and a potassium of 6.1.  Patient's oxygenation however was stable.  Nephrology consulted and plans to take patient for dialysis emergently tonight.  Vascular surgery consulted and they plan to take patient to the operating room tomorrow for wound repair.  Hospitalist were called for further evaluation and admission.  Review of Systems: Patient seen in the emergency room. Pt complains of some right arm soreness as well as feeling fatigued  Pt denies any headaches, vision changes, dysphagia, chest pain, palpitations, shortness of breath, wheeze, cough, abdominal pain, hematuria, dysuria, constipation, diarrhea, focal extremity  numbness weakness or pain other than described with his right arm pain at the area of his wound site..  Review of systems are otherwise negative   Past Medical History:  Diagnosis Date  . ESRD (end stage renal disease) ([place])   . Hypertension   . PAD (peripheral artery disease) (H[facility])    Required aortofemoral stent-which had closed and had to redo the procedure and ischemia of limb.  . Peripheral vascular disease ([place])   . Renal disorder   . Secondary hyperparathyroidism of renal origin [facility])    Past Surgical History:  Procedure Laterality Date  . A/V SHUNT INTERVENTION Left 01/19/2019   Procedure: LEFT UPPER EXTREMITY A/V SHUNTOGRAM / UPPER EXTREMITY ANGIOGRAM;  Surgeon: Algernon Huxley, MD;  Location: [place] [facility];  Service: Cardiovascular;  Laterality: Left;  . A/V SHUNT INTERVENTION Left 03/02/2019   Procedure: A/V SHUNT INTERVENTION;  Surgeon: Algernon Huxley, MD;  Location: [place] [facility];  Service: Cardiovascular;  Laterality: Left;  . AORTA - FEMORAL ARTERY BYPASS GRAFT    . AV FISTULA PLACEMENT Left 12/26/2018   Procedure: INSERTION OF GORE STRETCH VASCULAR 4-7MM X  45CM IN LEFT UPPER ARM;  Surgeon: Marty Heck, MD;  Location: [place];  Service: Vascular;  Laterality: Left;  . AV FISTULA PLACEMENT Right 08/12/2019   Procedure: INSERTION OF ARTERIOVENOUS (AV) GORE-TEX GRAFT ARM;  Surgeon: Algernon Huxley, MD;  Location: [facility];  Service: Vascular;  Laterality: Right;  . DIALYSIS/PERMA CATHETER REMOVAL N/A 02/06/2019   Procedure: DIALYSIS/PERMA CATHETER REMOVAL;  Surgeon: Algernon Huxley, MD;  Location: [place] [facility];  Service: Cardiovascular;  Laterality: N/A;  . LEFT  HEART CATH AND CORONARY ANGIOGRAPHY Right 07/10/2019   Procedure: LEFT HEART CATH AND CORONARY ANGIOGRAPHY;  Surgeon: Dionisio David, MD;  Location: [place] [facility];  Service: Cardiovascular;  Laterality: Right;  . PERIPHERAL VASCULAR THROMBECTOMY Left 07/28/2019   Procedure: Left Upper Extremity  Dialysis Access [place];  Surgeon: Katha Cabal, MD;  Location: [place] [facility];  Service: Cardiovascular;  Laterality: Left;  . UPPER EXTREMITY ANGIOGRAPHY Left 08/13/2019   Procedure: UPPER EXTREMITY ANGIOGRAPHy;  Surgeon: Algernon Huxley, MD;  Location: [place] [facility];  Service: Cardiovascular;  Laterality: Left;    Social History:  reports that he has been smoking cigarettes. He has a 15.00 pack-year smoking history. He has never used smokeless tobacco. He reports previous alcohol use. He reports current drug use. Frequency: 1.00 time per week. Drug: Marijuana.  Lives with several other roommates.  Ambulates with a walker   Allergies  Allergen Reactions  . Codeine Nausea Only    Patient questioned this (??)  . Sulfa Antibiotics Hives and Nausea And Vomiting    Family History  Problem Relation Age of Onset  . Hypertension Other   . Diabetes Other   . Clotting disorder Father       Prior to Admission medications   Medication Sig Start Date End Date Taking? Authorizing Provider  apixaban (ELIQUIS) 2.5 MG TABS tablet Take 1 tablet (2.5 mg total) by mouth 2 (two) times daily. 01/26/19   Clapacs, Madie Reno, MD  aspirin EC 81 MG tablet Take 1 tablet (81 mg total) by mouth daily. 01/26/19   Clapacs, Madie Reno, MD  atorvastatin (LIPITOR) 80 MG tablet Take 1 tablet (80 mg total) by mouth at bedtime. 01/26/19   Clapacs, Madie Reno, MD  b complex-C-folic acid 1 MG capsule Take 1 capsule by mouth daily after supper.     [provider]  carvedilol (COREG) 25 MG tablet Take 1 tablet (25 mg total) by mouth 2 (two) times daily with a meal. 01/26/19   Clapacs, Madie Reno, MD  cephALEXin (KEFLEX) 500 MG capsule Take 1 capsule (500 mg total) by mouth 2 (two) times daily. 08/21/19   Kris Hartmann, NP  gabapentin (NEURONTIN) 100 MG capsule Take 1 capsule (100 mg total) by mouth 3 (three) times daily. 01/26/19   Clapacs, Madie Reno, MD  hydrALAZINE (APRESOLINE) 25 MG tablet Take 1 tablet (25 mg total) by  mouth every 8 (eight) hours. 01/26/19   Clapacs, Madie Reno, MD  hydrOXYzine (ATARAX/VISTARIL) 25 MG tablet Take 25 mg by mouth 3 (three) times daily as needed for anxiety.    [provider]  levETIRAcetam (KEPPRA) 500 MG tablet Take 1 tablet (500 mg total) by mouth 2 (two) times daily. 08/17/19   Mayo, Pete Pelt, MD  loratadine (CLARITIN) 10 MG tablet Take 1 tablet (10 mg total) by mouth daily. 03/04/19   Salary, Holly Bodily D, MD  multivitamin (RENA-VIT) TABS tablet Take 1 tablet by mouth daily. 01/27/19   Clapacs, Madie Reno, MD  neomycin-bacitracin-polymyxin (NEOSPORIN) OINT Apply 1 application topically 2 (two) times daily. 01/26/19   Clapacs, Madie Reno, MD  Nutritional Supplements (FEEDING SUPPLEMENT, NEPRO CARB STEADY,) LIQD Take 237 mLs by mouth 2 (two) times daily between meals. 07/06/19   Epifanio Lesches, MD  sevelamer carbonate (RENVELA) 800 MG tablet Take 2 tablets (1,600 mg total) by mouth 3 (three) times daily with meals. 01/26/19   Clapacs, Madie Reno, MD    Physical Exam: BP (!) 150/96   Pulse 91  Resp 14   Ht 5\' 1"  (1.549 m)   Wt 47.6 kg   SpO2 99%   BMI 19.84 kg/m   General: Alert and oriented x3, flattened affect Eyes: Sclera nonicteric extraocular movements are intact ENT: Normocephalic and atraumatic, mucous membranes slightly dry Neck: Supple mild JVD Cardiovascular: Regular rate and rhythm, S1-S2 Respiratory: Clear to auscultation bilaterally Abdomen: Soft, nontender, nondistended, positive bowel sounds Skin: In patient's right upper extremity, at the antecubital fossa he has a bandage covering a dehisced wound her previous aneurysm repair was done.,  Currently no active drainage Musculoskeletal: No clubbing or cyanosis, trace pitting edema Psychiatric: Flattened affect, no evidence of acute psychoses Neurologic: No focal deficits          Labs on Admission:  Basic Metabolic Panel: Recent Labs  Lab 08/24/19 1218  NA 133*  K 6.1*  CL 93*  CO2 13*  GLUCOSE 129*   BUN 124*  CREATININE 8.14*  CALCIUM 9.0   Liver Function Tests: Recent Labs  Lab 08/24/19 1218  AST 475*  ALT 515*  ALKPHOS 137*  BILITOT 3.2*  PROT 6.3*  ALBUMIN 3.3*   No results for input(s): LIPASE, AMYLASE in the last 168 hours. No results for input(s): AMMONIA in the last 168 hours. CBC: Recent Labs  Lab 08/24/19 1218  WBC 12.4*  NEUTROABS 10.2*  HGB 10.1*  HCT 32.1*  MCV 103.2*  PLT 51*   Cardiac Enzymes: No results for input(s): CKTOTAL, CKMB, CKMBINDEX, TROPONINI in the last 168 hours.  BNP (last 3 results) Recent Labs    07/20/19 1029 08/03/19 0925 08/05/19 0431  BNP >4,500.0* >4,500.0* 3,772.0*    ProBNP (last 3 results) No results for input(s): PROBNP in the last 8760 hours.  CBG: No results for input(s): GLUCAP in the last 168 hours.  Radiological Exams on Admission: US Venous Img Upper Uni Right  Result Date: 08/24/2019 CLINICAL DATA:  43 year old male with right upper extremity pain and swelling. Patient has a right upper extremity arteriovenous hemodialysis graft. EXAM: RIGHT UPPER EXTREMITY VENOUS DOPPLER ULTRASOUND TECHNIQUE: Gray-scale sonography with graded compression, as well as color Doppler and duplex ultrasound were performed to evaluate the upper extremity deep venous system from the level of the subclavian vein and including the jugular, axillary, basilic, radial, ulnar and upper cephalic vein. Spectral Doppler was utilized to evaluate flow at rest and with distal augmentation maneuvers. COMPARISON:  None. FINDINGS: Contralateral Subclavian Vein: Respiratory phasicity is normal and symmetric with the symptomatic side. No evidence of thrombus. Normal compressibility. Internal Jugular Vein: No evidence of thrombus. Normal compressibility, respiratory phasicity and response to augmentation. Subclavian Vein: No evidence of thrombus. Normal compressibility, respiratory phasicity and response to augmentation. Axillary Vein: No evidence of  thrombus. Normal compressibility, respiratory phasicity and response to augmentation. Cephalic Vein: No evidence of thrombus. Normal compressibility, respiratory phasicity and response to augmentation. Basilic Vein: No evidence of thrombus. Normal compressibility, respiratory phasicity and response to augmentation. Brachial Veins: No evidence of thrombus. Normal compressibility, respiratory phasicity and response to augmentation. Radial Veins: One of the paired radial veins is not compressible and demonstrates no evidence of flow on color Doppler imaging. Findings are consistent with thrombosis. Ulnar Veins: No evidence of thrombus. Normal compressibility, respiratory phasicity and response to augmentation. Venous Reflux:  None visualized. Other Findings: There is a large complex heterogeneous fluid collection in the region of the axilla surrounding the arteriovenous graft. The collection is difficult to measure given its amorphous nature. An estimated measurement is 4.2 x 1.5  by 6.5 cm IMPRESSION: 1. Positive for acute occlusive deep venous thrombosis in 1 of the paired radial veins in the forearm. 2. Large complex fluid collection surrounding the arteriovenous graft in the axilla. Differential considerations include hematoma and abscess. 3. The arteriovenous graft remains patent. No evidence of internal thrombus. Signed, Criselda Peaches, MD, [place] Electronically Signed   By: Jacqulynn Cadet M.D.   On: 08/24/2019 14:41    EKG: Pending  Assessment/Plan Present on Admission: . Hypertension . MDD (major depressive disorder), single episode, severe , no psychosis ([place]) . Malnutrition of moderate degree . Tobacco abuse . Marijuana use . PVD (peripheral vascular disease) ([place]) . Chronic systolic CHF (congestive heart failure) ([place]) . Volume overload . Hyperkalemia . Complications due to renal dialysis device, implant, and graft   Principal Problem:   Wound dehiscence, surgical, sequela/Complications due to renal dialysis device, implant, and graft: Seen by vascular surgery who plan to take patient to the OR tomorrow  Active Problems:   End stage renal disease on dialysis [facility]): History of noncompliance and missing sessions.  Seen by nephrology who plan to take patient for dialysis tonight    Hypertension: Mildly elevated secondary to missing dialysis.  Continue home medications and managed with dialysis    MDD (major depressive disorder), single episode, severe , no psychosis ([place]): Suspect there may be a component of this was causing patient missed dialysis.  He is not suicidal.  Would however consider psychiatry consult once he is better stabilized    Malnutrition of moderate degree: Nutrition to see.    Tobacco abuse: Mild, continue to counsel.  Given need for vascular repair, hold off on nicotine patch.    Marijuana use   PVD (peripheral vascular disease) (H[facility])   Chronic systolic CHF (congestive heart failure) (H[facility]) and volume overload: Ejection fraction less than 20%.  Also has moderate to severe mitral regurg.  Would not be a candidate for mitral repair at this time.  Manage volume overload with dialysis.   Volume overload    Hyperkalemia: Secondary to missed dialysis.  Has received calcium gluconate as well as bicarb.  Stable    Questionable seizure disorder: Diagnosed in the setting of his previous hospitalization.  On Keppra, will continue    DVT prophylaxis: SCDs  Code Status: Patient previously was a DNR, but when I asked him today and reviewed his CODE STATUS, he said he wished to have everything done.  Family Communication: Patient declined for me to call anyone  Disposition Plan: Be here for several days as we stabilize his dialysis as well as his wound is fixed.  Consults called: Vascular surgery Nephrology  Admission status: Given need for acute hospital services and will be here past 2  midnights, admit as inpatient    Annita Brod MD [facility] Pager [phone]  If 7PM-7AM, please contact night-coverage [email or web address] Password [facility]  08/24/2019, 4:38 PM

## 2019-08-24 NOTE — Progress Notes (Addendum)
Vein & Vascular Surgery Daily Progress Note  Procedures: 08/13/19: 1. Ultrasound guidance vascular access to right femoral artery. 2. Catheter placement toleft brachial artery  from right femoral approach. 3. Thoracic aortogram and selectiveleftupper extremity angiogram 4.Covered stent placement left brachial artery with a 6 mm diameter by 7.5 cm length Viabahn stent to exclude the pseudoaneurysm 5. StarClose closure device right femoral artery.  08/12/19: Rightupper arm brachial artery to axillary vein arteriovenous graft with 6 mm Propaten pTFE graft  Subjective: The patient is a 43 year old male with a past medical history of PEA arrest requiring subsequent intubation, end-stage renal disease on chronic hemodialysis, hypertension, major depressive order, who was discharged home with visiting nurse services on August 17, 2019 who presented to the [facility] emergency department with a chief complaint of wound dehiscence.  The patient is status post right hero graft placement on August 12, 2019 with subsequent wound dehiscence of the right antecubital incision status post washout and reclosure with nylon mattress sutures.  Patient is currently being maintained by a right IJ PermCath however he has missed his last 2 outpatient dialysis appointments due to "not being able to get a ride".  Patient states progressively worsening right upper extremity discomfort.  He denies any fever however states he is experiencing chills.  Denies nausea vomiting.  Denies shortness of breath or chest pain.  Vascular surgery was consulted by emergency room physician Dr. Jimmye Norman in regard to patient's wound dehiscence.  Objective: Vitals:   08/24/19 1151 08/24/19 1153 08/24/19 1156  BP:   (!) 192/127  Pulse: 97    Resp: 16    SpO2: 100%    Weight:  47.6 kg   Height:  5\' 1"  (1.549 m)    No intake or output data in the 24 hours ending 08/24/19 1338  Physical  Exam: A&Ox3, NAD Chest: Right IJ percamth intact, clean an dry CV: RRR Pulmonary: CTA Bilaterally Abdomen: Soft, Nontender, Nondistended Vascular:  Right Upper Extremity: Dehiscence of the medial aspect of the incision is noted.  Some fibrinous exudate noted in wound bed.  Some serous drainage noted.  Motor/function intact.  Hand is warm.    Document Information Photos    08/24/2019 12:54  Attached To:  Hospital Encounter on 08/24/19  Source Information , [facility]  [facility]Emergency Department    Laboratory: CBC    Component Value Date/Time   WBC 12.4 (H) 08/24/2019 1218   HGB 10.1 (L) 08/24/2019 1218   HCT 32.1 (L) 08/24/2019 1218   PLT 51 (L) 08/24/2019 1218   BMET    Component Value Date/Time   NA 133 (L) 08/24/2019 1218   K 6.1 (H) 08/24/2019 1218   CL 93 (L) 08/24/2019 1218   CO2 13 (L) 08/24/2019 1218   GLUCOSE 129 (H) 08/24/2019 1218   BUN 124 (H) 08/24/2019 1218   CREATININE 8.14 (H) 08/24/2019 1218   CALCIUM 9.0 08/24/2019 1218   GFRNONAA 7 (L) 08/24/2019 1218   GFRAA 8 (L) 08/24/2019 1218   Assessment/Planning: The patient is a 43 year old male with multiple medical issues including end-stage renal disease on chronic hemodialysis 1) admit to hospitalist service as the patient has a litany of chronic medical issues including recent PEA arrest with new diagnosis of liver failure 2) we will plan to take the patient to the operating room tomorrow morning to remove his graft and wound washout. 3) nephrology has been consulted as the patient has missed his last 2 dialysis treatments  Discussed with  Dr. Eber Hong  PA-C 08/24/2019 1:38 PM

## 2019-08-24 NOTE — ED Notes (Signed)
Pt returned from US

## 2019-08-24 NOTE — ED Provider Notes (Signed)
I have discussed with dialysis, we will arrange for dialysis later today.   Earleen Newport, MD 08/24/19 1316

## 2019-08-24 NOTE — Progress Notes (Signed)
Dialysis RN stated that could not dialyze the patient due to a "water problem."  Spoke with Dr Holley Raring.  He stated that the patient needed dialysis this early morning to got to OR in the am of 11/3.  Attempted to notify dialysis without response,

## 2019-08-24 NOTE — Progress Notes (Signed)
Dialysis consents completed.

## 2019-08-24 NOTE — Progress Notes (Signed)
Pt comes in now after having missed 2 outpt dialysis sessions.  Presents now with wound dehiscence and hyperkalemia.  He will go to the OR tomorrow.  We will plan for dialysis tonight given hyperkalemia.

## 2019-08-24 NOTE — Progress Notes (Signed)
Pt transported to dialysis unit.  

## 2019-08-25 ENCOUNTER — Inpatient Hospital Stay: Payer: Medicare Other

## 2019-08-25 ENCOUNTER — Encounter
Admission: EM | Disposition: A | Discharge status: Home Health | Payer: Self-pay | Source: Home / Self Care | Attending: Family Medicine

## 2019-08-25 ENCOUNTER — Encounter: Payer: Self-pay | Admitting: Internal Medicine

## 2019-08-25 ENCOUNTER — Inpatient Hospital Stay: Payer: Medicare Other | Admitting: Registered Nurse

## 2019-08-25 DIAGNOSIS — N186 End stage renal disease: Secondary | ICD-10-CM

## 2019-08-25 DIAGNOSIS — T8131XS Disruption of external operation (surgical) wound, not elsewhere classified, sequela: Secondary | ICD-10-CM

## 2019-08-25 DIAGNOSIS — T82898A Other specified complication of vascular prosthetic devices, implants and grafts, initial encounter: Secondary | ICD-10-CM

## 2019-08-25 DIAGNOSIS — Z992 Dependence on renal dialysis: Secondary | ICD-10-CM

## 2019-08-25 HISTORY — PX: TEMPORARY DIALYSIS CATHETER: CATH118312

## 2019-08-25 LAB — CBC
HCT: 27.8 % — ABNORMAL LOW (ref 39.0–52.0)
HCT: 28.7 % — ABNORMAL LOW (ref 39.0–52.0)
Hemoglobin: 8.8 g/dL — ABNORMAL LOW (ref 13.0–17.0)
Hemoglobin: 9.2 g/dL — ABNORMAL LOW (ref 13.0–17.0)
MCH: 32.2 pg (ref 26.0–34.0)
MCH: 32.4 pg (ref 26.0–34.0)
MCHC: 31.7 g/dL (ref 30.0–36.0)
MCHC: 32.1 g/dL (ref 30.0–36.0)
MCV: 101.8 fL — ABNORMAL HIGH (ref 80.0–100.0)
MCV: 101.1 fL — ABNORMAL HIGH (ref 80.0–100.0)
Platelets: 34 10*3/uL — ABNORMAL LOW (ref 150–400)
Platelets: 35 10*3/uL — ABNORMAL LOW (ref 150–400)
RBC: 2.73 MIL/uL — ABNORMAL LOW (ref 4.22–5.81)
RBC: 2.84 MIL/uL — ABNORMAL LOW (ref 4.22–5.81)
RDW: 19.9 % — ABNORMAL HIGH (ref 11.5–15.5)
RDW: 20 % — ABNORMAL HIGH (ref 11.5–15.5)
WBC: 8.3 10*3/uL (ref 4.0–10.5)
WBC: 10.9 10*3/uL — ABNORMAL HIGH (ref 4.0–10.5)
nRBC: 0.7 % — ABNORMAL HIGH (ref 0.0–0.2)
nRBC: 0.8 % — ABNORMAL HIGH (ref 0.0–0.2)

## 2019-08-25 LAB — PROTIME-INR
INR: 2.1 — ABNORMAL HIGH (ref 0.8–1.2)
INR: 2 — ABNORMAL HIGH (ref 0.8–1.2)
Prothrombin Time: 23 seconds — ABNORMAL HIGH (ref 11.4–15.2)
Prothrombin Time: 22.1 seconds — ABNORMAL HIGH (ref 11.4–15.2)

## 2019-08-25 LAB — BASIC METABOLIC PANEL
Anion gap: 18 — ABNORMAL HIGH (ref 5–15)
BUN: 128 mg/dL — ABNORMAL HIGH (ref 6–20)
CO2: 23 mmol/L (ref 22–32)
Calcium: 8.5 mg/dL — ABNORMAL LOW (ref 8.9–10.3)
Chloride: 98 mmol/L (ref 98–111)
Creatinine, Ser: 7.83 mg/dL — ABNORMAL HIGH (ref 0.61–1.24)
GFR calc Af Amer: 9 mL/min — ABNORMAL LOW (ref >60)
GFR calc non Af Amer: 8 mL/min — ABNORMAL LOW (ref >60)
Glucose, Bld: 113 mg/dL — ABNORMAL HIGH (ref 70–99)
Potassium: 5.4 mmol/L — ABNORMAL HIGH (ref 3.5–5.1)
Sodium: 139 mmol/L (ref 135–145)

## 2019-08-25 LAB — APTT: aPTT: 38 seconds — ABNORMAL HIGH (ref 24–36)

## 2019-08-25 LAB — PHOSPHORUS: Phosphorus: 6.3 mg/dL — ABNORMAL HIGH (ref 2.5–4.6)

## 2019-08-25 LAB — MAGNESIUM: Magnesium: 2.5 mg/dL — ABNORMAL HIGH (ref 1.7–2.4)

## 2019-08-25 SURGERY — REMOVAL, GRAFT
Anesthesia: Choice | Laterality: Right

## 2019-08-25 SURGERY — TEMPORARY DIALYSIS CATHETER
Anesthesia: LOCAL

## 2019-08-25 MED ORDER — PENTAFLUOROPROP-TETRAFLUOROETH EX AERO
1.0000 "application " | INHALATION_SPRAY | CUTANEOUS | Status: DC | PRN
  Filled 2019-08-25: qty 30

## 2019-08-25 MED ORDER — LIDOCAINE HCL (PF) 1 % IJ SOLN
5.0000 mL | INTRAMUSCULAR | Status: DC | PRN
  Filled 2019-08-25: qty 5

## 2019-08-25 MED ORDER — VANCOMYCIN HCL IN DEXTROSE 1-5 GM/200ML-% IV SOLN
1000.0000 mg | Freq: Once | INTRAVENOUS | Status: AC
  Administered 2019-08-26: 1000 mg via INTRAVENOUS
  Filled 2019-08-25 (×2): qty 200

## 2019-08-25 MED ORDER — LIDOCAINE-PRILOCAINE 2.5-2.5 % EX CREA
1.0000 "application " | TOPICAL_CREAM | CUTANEOUS | Status: DC | PRN
  Filled 2019-08-25: qty 5

## 2019-08-25 MED ORDER — CHLORHEXIDINE GLUCONATE CLOTH 2 % EX PADS
6.0000 | MEDICATED_PAD | Freq: Once | CUTANEOUS | Status: AC
  Administered 2019-08-26: 05:00:00 6 via TOPICAL

## 2019-08-25 MED ORDER — LIDOCAINE HCL (PF) 1 % IJ SOLN: 5.0000 mL | INTRAMUSCULAR | Status: DC | PRN

## 2019-08-25 MED ORDER — ALTEPLASE 2 MG IJ SOLR: 2.0000 mg | Freq: Once | INTRAMUSCULAR | Status: DC | PRN

## 2019-08-25 MED ORDER — LIDOCAINE HCL (PF) 1 % IJ SOLN
INTRAMUSCULAR | Status: DC | PRN
  Administered 2019-08-25: 5 mL via INTRADERMAL

## 2019-08-25 MED ORDER — HEPARIN SODIUM (PORCINE) 1000 UNIT/ML DIALYSIS
1000.0000 [IU] | INTRAMUSCULAR | Status: DC | PRN
  Filled 2019-08-25: qty 1

## 2019-08-25 MED ORDER — VITAMIN K1 10 MG/ML IJ SOLN
10.0000 mg | INTRAVENOUS | Status: AC
  Administered 2019-08-25: 10:00:00 10 mg via INTRAVENOUS
  Filled 2019-08-25: qty 1

## 2019-08-25 MED ORDER — HEPARIN SODIUM (PORCINE) 1000 UNIT/ML DIALYSIS: 1000.0000 [IU] | INTRAMUSCULAR | Status: DC | PRN

## 2019-08-25 MED ORDER — CARVEDILOL 25 MG PO TABS
25.0000 mg | ORAL_TABLET | Freq: Two times a day (BID) | ORAL | Status: DC
  Administered 2019-08-26 – 2019-08-28 (×3): 25 mg via ORAL
  Filled 2019-08-25 (×5): qty 1

## 2019-08-25 MED ORDER — RENA-VITE PO TABS
1.0000 | ORAL_TABLET | Freq: Every day | ORAL | Status: DC
  Administered 2019-08-26 – 2019-08-28 (×2): 1 via ORAL
  Filled 2019-08-25 (×2): qty 1

## 2019-08-25 MED ORDER — HYDRALAZINE HCL 20 MG/ML IJ SOLN: 10.0000 mg | Freq: Four times a day (QID) | INTRAMUSCULAR | Status: DC | PRN

## 2019-08-25 MED ORDER — HYDRALAZINE HCL 25 MG PO TABS
25.0000 mg | ORAL_TABLET | Freq: Three times a day (TID) | ORAL | Status: DC
  Administered 2019-08-26 – 2019-08-28 (×6): 25 mg via ORAL
  Filled 2019-08-25 (×7): qty 1

## 2019-08-25 MED ORDER — CHLORHEXIDINE GLUCONATE CLOTH 2 % EX PADS: 6.0000 | MEDICATED_PAD | Freq: Once | CUTANEOUS | Status: DC

## 2019-08-25 MED ORDER — SODIUM CHLORIDE 0.9 % IV SOLN: 100.0000 mL | INTRAVENOUS | Status: DC | PRN

## 2019-08-25 MED ORDER — CEFAZOLIN SODIUM-DEXTROSE 1-4 GM/50ML-% IV SOLN
1.0000 g | INTRAVENOUS | Status: DC
  Filled 2019-08-25: qty 50

## 2019-08-25 MED ORDER — CHLORHEXIDINE GLUCONATE CLOTH 2 % EX PADS
6.0000 | MEDICATED_PAD | Freq: Every day | CUTANEOUS | Status: DC
  Administered 2019-08-26: 06:00:00 6 via TOPICAL

## 2019-08-25 MED ORDER — FENTANYL CITRATE (PF) 100 MCG/2ML IJ SOLN
INTRAMUSCULAR | Status: AC
  Filled 2019-08-25: qty 2

## 2019-08-25 MED ORDER — PROPOFOL 10 MG/ML IV BOLUS
INTRAVENOUS | Status: AC
  Filled 2019-08-25: qty 20

## 2019-08-25 MED ORDER — VITAMIN C 500 MG PO TABS
250.0000 mg | ORAL_TABLET | Freq: Two times a day (BID) | ORAL | Status: DC
  Administered 2019-08-26 – 2019-08-28 (×4): 250 mg via ORAL
  Filled 2019-08-25 (×6): qty 1

## 2019-08-25 MED ORDER — LIDOCAINE-PRILOCAINE 2.5-2.5 % EX CREA: 1.0000 "application " | TOPICAL_CREAM | CUTANEOUS | Status: DC | PRN

## 2019-08-25 MED ORDER — HEPARIN SODIUM (PORCINE) 5000 UNIT/ML IJ SOLN
INTRAMUSCULAR | Status: AC
  Filled 2019-08-25: qty 1

## 2019-08-25 MED ORDER — HYDROMORPHONE HCL 1 MG/ML IJ SOLN
0.5000 mg | INTRAMUSCULAR | Status: AC | PRN
  Administered 2019-08-25 – 2019-08-27 (×4): 0.5 mg via INTRAVENOUS
  Filled 2019-08-25 (×4): qty 0.5

## 2019-08-25 SURGICAL SUPPLY — 53 items
BAG DECANTER FOR FLEXI CONT (MISCELLANEOUS) ×3 IMPLANT
BLADE SURG SZ11 CARB STEEL (BLADE) ×3 IMPLANT
BOOT SUTURE AID YELLOW STND (SUTURE) ×3 IMPLANT
BRUSH SCRUB EZ  4% CHG (MISCELLANEOUS) ×2
BRUSH SCRUB EZ 4% CHG (MISCELLANEOUS) ×1 IMPLANT
CANISTER SUCT 1200ML W/VALVE (MISCELLANEOUS) ×3 IMPLANT
CHLORAPREP W/TINT 26 (MISCELLANEOUS) ×3 IMPLANT
COVER WAND RF STERILE (DRAPES) ×3 IMPLANT
DERMABOND ADVANCED (GAUZE/BANDAGES/DRESSINGS) ×2
DERMABOND ADVANCED .7 DNX12 (GAUZE/BANDAGES/DRESSINGS) ×1 IMPLANT
DRAPE 3/4 80X56 (DRAPES) ×3 IMPLANT
DRAPE INCISE IOBAN 66X45 STRL (DRAPES) ×3 IMPLANT
DRESSING SURGICEL FIBRLLR 1X2 (HEMOSTASIS) ×1 IMPLANT
DRSG OPSITE POSTOP 4X6 (GAUZE/BANDAGES/DRESSINGS) ×3 IMPLANT
DRSG SURGICEL FIBRILLAR 1X2 (HEMOSTASIS) ×3
ELECT CAUTERY BLADE 6.4 (BLADE) ×3 IMPLANT
ELECT REM PT RETURN 9FT ADLT (ELECTROSURGICAL) ×3
ELECTRODE REM PT RTRN 9FT ADLT (ELECTROSURGICAL) ×1 IMPLANT
GLOVE SURG SYN 8.0 (GLOVE) ×3 IMPLANT
GOWN STRL REUS W/ TWL LRG LVL3 (GOWN DISPOSABLE) ×1 IMPLANT
GOWN STRL REUS W/ TWL XL LVL3 (GOWN DISPOSABLE) ×1 IMPLANT
GOWN STRL REUS W/TWL LRG LVL3 (GOWN DISPOSABLE) ×2
GOWN STRL REUS W/TWL XL LVL3 (GOWN DISPOSABLE) ×2
IV NS 500ML (IV SOLUTION) ×2
IV NS 500ML BAXH (IV SOLUTION) ×1 IMPLANT
KIT TURNOVER KIT A (KITS) ×3 IMPLANT
LABEL OR SOLS (LABEL) ×3 IMPLANT
LOOP RED MAXI  1X406MM (MISCELLANEOUS) ×4
LOOP VESSEL MAXI 1X406 RED (MISCELLANEOUS) ×2 IMPLANT
LOOP VESSEL MINI 0.8X406 BLUE (MISCELLANEOUS) ×2 IMPLANT
LOOPS BLUE MINI 0.8X406MM (MISCELLANEOUS) ×4
NEEDLE HYPO 18GX1.5 BLUNT FILL (NEEDLE) ×3 IMPLANT
NS IRRIG 1000ML POUR BTL (IV SOLUTION) ×3 IMPLANT
PACK BASIN MAJOR ARMC (MISCELLANEOUS) ×3 IMPLANT
PACK UNIVERSAL (MISCELLANEOUS) ×3 IMPLANT
SUT MNCRL+ 5-0 UNDYED PC-3 (SUTURE) ×1 IMPLANT
SUT MONOCRYL 5-0 (SUTURE) ×2
SUT PROLENE 5 0 RB 1 DA (SUTURE) ×6 IMPLANT
SUT PROLENE 6 0 BV (SUTURE) ×12 IMPLANT
SUT SILK 2 0 (SUTURE) ×2
SUT SILK 2-0 18XBRD TIE 12 (SUTURE) ×1 IMPLANT
SUT SILK 3 0 (SUTURE) ×2
SUT SILK 3-0 18XBRD TIE 12 (SUTURE) ×1 IMPLANT
SUT SILK 4 0 (SUTURE) ×2
SUT SILK 4-0 18XBRD TIE 12 (SUTURE) ×1 IMPLANT
SUT VIC AB 2-0 CT1 27 (SUTURE) ×4
SUT VIC AB 2-0 CT1 TAPERPNT 27 (SUTURE) ×2 IMPLANT
SUT VIC AB 3-0 SH 27 (SUTURE) ×2
SUT VIC AB 3-0 SH 27X BRD (SUTURE) ×1 IMPLANT
SUT VICRYL+ 3-0 36IN CT-1 (SUTURE) ×6 IMPLANT
SYR 10ML LL (SYRINGE) ×3 IMPLANT
SYR 20ML LL LF (SYRINGE) ×3 IMPLANT
SYR 3ML LL SCALE MARK (SYRINGE) ×3 IMPLANT

## 2019-08-25 SURGICAL SUPPLY — 2 items
COVER PROBE U/S 5X48 (MISCELLANEOUS) ×2 IMPLANT
KIT DIALYSIS CATH TRI 30X13 (CATHETERS) ×2 IMPLANT

## 2019-08-25 NOTE — Progress Notes (Signed)
Pre HD Assessment:    08/24/19 2240  Neurological  Level of Consciousness Alert  Orientation Level Oriented X4  Respiratory  Respiratory Pattern Regular;Unlabored  Cardiac  Pulse Regular  Heart Sounds S1, S2  Vascular  R Radial Pulse +1  L Radial Pulse +1  Psychosocial  Psychosocial (WDL) X  Patient Behaviors Calm

## 2019-08-25 NOTE — Progress Notes (Signed)
Pt found to have pulled his perm cath for HD out of his Right IJ while he was sleeping.  Anastasio Champion, NP.   Spoke with Dr Holley Raring.  He stated to clean the site and apply transparent dressing.  Vascular to evaluate in the am.

## 2019-08-25 NOTE — Progress Notes (Signed)
[facility]  ROUNDING NOTE   Subjective:  Patient well-known to [place] as we follow him for outpatient hemodialysis. At the last hospitalization he had a PEA arrest but did recover. Comes in now with wound dehiscence from his left upper extremity access. Also missed to outpatient dialysis treatments.   Objective:  Vital signs in last 24 hours:  Temp:  [97 F (36.1 C)-98.6 F (37 C)] 98.6 F (37 C) (11/03 1140) Pulse Rate:  [85-93] 91 (11/03 1145) Resp:  [14-18] 16 (11/03 1145) BP: (134-169)/(82-119) 166/108 (11/03 1145) SpO2:  [93 %-100 %] 94 % (11/03 0500) Weight:  [48.2 kg] 48.2 kg (11/02 1807)  Weight change:  Filed Weights   08/24/19 1153 08/24/19 1807  Weight: 47.6 kg 48.2 kg    Intake/Output: I/O last 3 completed shifts: In: 362.1 [P.O.:120; IV Piggyback:242.1] Out: 0    Intake/Output this shift:  No intake/output data recorded.  Physical Exam: General: Chronically ill-appearing  Head: Normocephalic, atraumatic. Moist oral mucosal membranes  Eyes: Anicteric  Neck: Supple, trachea midline  Lungs:  Clear to auscultation, normal effort  Heart: S1S2 no rubs  Abdomen:  Soft, nontender, bowel sounds present  Extremities: Trace peripheral edema.  Neurologic: Awake, alert, following commands  Skin: No lesions  Access: R IJ permcath, cuff right at exit site, LUE AV access wrapped, femoral temporary dialysis catheter    Basic Metabolic Panel: Recent Labs  Lab 08/24/19 1218 08/25/19 0251  NA 133* 139  K 6.1* 5.4*  CL 93* 98  CO2 13* 23  GLUCOSE 129* 113*  BUN 124* 128*  CREATININE 8.14* 7.83*  CALCIUM 9.0 8.5*  MG  --  2.5*    Liver Function Tests: Recent Labs  Lab 08/24/19 1218  AST 475*  ALT 515*  ALKPHOS 137*  BILITOT 3.2*  PROT 6.3*  ALBUMIN 3.3*   No results for input(s): LIPASE, AMYLASE in the last 168 hours. No results for input(s): AMMONIA in the last 168 hours.  CBC: Recent Labs  Lab 08/24/19 1218 08/25/19 0251  WBC  12.4* 8.3  NEUTROABS 10.2*  --   HGB 10.1* 8.8*  HCT 32.1* 27.8*  MCV 103.2* 101.8*  PLT 51* 34*    Cardiac Enzymes: No results for input(s): CKTOTAL, CKMB, CKMBINDEX, TROPONINI in the last 168 hours.  BNP: Invalid input(s): POCBNP  CBG: Recent Labs  Lab 08/24/19 1806  GLUCAP 10    Microbiology: Results for orders placed or performed during the hospital encounter of 08/24/19  Aerobic/Anaerobic Culture (surgical/deep wound)     Status: None (Preliminary result)   Collection Time: 08/24/19 12:22 PM   Specimen: Wound  Result Value Ref Range Status   Specimen Description   Final    WOUND Performed at [facility], [address], [place], [place] [address]    Special Requests   Final    Normal Performed at [facility], [name] [place], [place] [address]    Gram Stain   Final    RARE WBC PRESENT, PREDOMINANTLY PMN RARE GRAM POSITIVE COCCI IN PAIRS Performed at [facility], [place] [address], [place], [place] [address]    Culture PENDING  Incomplete   Report Status PENDING  Incomplete  SARS Coronavirus 2 by RT PCR (hospital order, performed in [facility] hospital lab) Nasopharyngeal Nasopharyngeal Swab     Status: None   Collection Time: 08/24/19  1:37 PM   Specimen: Nasopharyngeal Swab  Result Value Ref Range Status   SARS Coronavirus 2 NEGATIVE  NEGATIVE Final    Comment: (NOTE) If result is NEGATIVE SARS-CoV-2 target nucleic acids are NOT DETECTED. The SARS-CoV-2 RNA is generally detectable in upper and lower  respiratory specimens during the acute phase of infection. The lowest  concentration of SARS-CoV-2 viral copies this assay can detect is 250  copies / mL. A negative result does not preclude SARS-CoV-2 infection  and should not be used as the sole basis for treatment or other  patient management decisions.  A negative result may occur with  improper specimen collection / handling, submission of specimen other  than  nasopharyngeal swab, presence of viral mutation(s) within the  areas targeted by this assay, and inadequate number of viral copies  (<250 copies / mL). A negative result must be combined with clinical  observations, patient history, and epidemiological information. If result is POSITIVE SARS-CoV-2 target nucleic acids are DETECTED. The SARS-CoV-2 RNA is generally detectable in upper and lower  respiratory specimens dur ing the acute phase of infection.  Positive  results are indicative of active infection with SARS-CoV-2.  Clinical  correlation with patient history and other diagnostic information is  necessary to determine patient infection status.  Positive results do  not rule out bacterial infection or co-infection with other viruses. If result is PRESUMPTIVE POSTIVE SARS-CoV-2 nucleic acids MAY BE PRESENT.   A presumptive positive result was obtained on the submitted specimen  and confirmed on repeat testing.  While 2019 novel coronavirus  (SARS-CoV-2) nucleic acids may be present in the submitted sample  additional confirmatory testing may be necessary for epidemiological  and / or clinical management purposes  to differentiate between  SARS-CoV-2 and other Sarbecovirus currently known to infect humans.  If clinically indicated additional testing with an alternate test  methodology [phone]) is advised. The SARS-CoV-2 RNA is generally  detectable in upper and lower respiratory sp ecimens during the acute  phase of infection. The expected result is Negative. Fact Sheet for Patients:  StrictlyIdeas.no Fact Sheet for Healthcare Providers: BankingDealers.co.za This test is not yet approved or cleared by the [place] FDA and has been authorized for detection and/or diagnosis of SARS-CoV-2 by FDA under an Emergency Use Authorization (EUA).  This EUA will remain in effect (meaning this test can be used) for the duration of  the COVID-19 declaration under Section 564(b)(1) of the Act, 21 U.S.C. section 360bbb-3(b)(1), unless the authorization is terminated or revoked sooner. Performed at [facility], [address] [place], [place] [address]     Coagulation Studies: Recent Labs    08/25/19 0251  LABPROT 23.0*  INR 2.1*    Urinalysis: No results for input(s): COLORURINE, LABSPEC, PHURINE, GLUCOSEU, HGBUR, BILIRUBINUR, KETONESUR, PROTEINUR, UROBILINOGEN, NITRITE, LEUKOCYTESUR in the last 72 hours.  Invalid input(s): APPERANCEUR    Imaging: US Venous Img Upper Uni Right  Result Date: 08/24/2019 CLINICAL DATA:  43 year old male with right upper extremity pain and swelling. Patient has a right upper extremity arteriovenous hemodialysis graft. EXAM: RIGHT UPPER EXTREMITY VENOUS DOPPLER ULTRASOUND TECHNIQUE: Gray-scale sonography with graded compression, as well as color Doppler and duplex ultrasound were performed to evaluate the upper extremity deep venous system from the level of the subclavian vein and including the jugular, axillary, basilic, radial, ulnar and upper cephalic vein. Spectral Doppler was utilized to evaluate flow at rest and with distal augmentation maneuvers. COMPARISON:  None. FINDINGS: Contralateral Subclavian Vein: Respiratory phasicity is normal and symmetric with the symptomatic side. No evidence of thrombus. Normal compressibility. Internal Jugular Vein: No evidence  of thrombus. Normal compressibility, respiratory phasicity and response to augmentation. Subclavian Vein: No evidence of thrombus. Normal compressibility, respiratory phasicity and response to augmentation. Axillary Vein: No evidence of thrombus. Normal compressibility, respiratory phasicity and response to augmentation. Cephalic Vein: No evidence of thrombus. Normal compressibility, respiratory phasicity and response to augmentation. Basilic Vein: No evidence of thrombus. Normal compressibility, respiratory phasicity  and response to augmentation. Brachial Veins: No evidence of thrombus. Normal compressibility, respiratory phasicity and response to augmentation. Radial Veins: One of the paired radial veins is not compressible and demonstrates no evidence of flow on color Doppler imaging. Findings are consistent with thrombosis. Ulnar Veins: No evidence of thrombus. Normal compressibility, respiratory phasicity and response to augmentation. Venous Reflux:  None visualized. Other Findings: There is a large complex heterogeneous fluid collection in the region of the axilla surrounding the arteriovenous graft. The collection is difficult to measure given its amorphous nature. An estimated measurement is 4.2 x 1.5 by 6.5 cm IMPRESSION: 1. Positive for acute occlusive deep venous thrombosis in 1 of the paired radial veins in the forearm. 2. Large complex fluid collection surrounding the arteriovenous graft in the axilla. Differential considerations include hematoma and abscess. 3. The arteriovenous graft remains patent. No evidence of internal thrombus. Signed, Criselda Peaches, MD, [facility] Radiology Electronically Signed   By: Jacqulynn Cadet M.D.   On: 08/24/2019 14:41   Dg Chest Port 1 View  Result Date: 08/25/2019 CLINICAL DATA:  Displacement of central venous catheter. EXAM: PORTABLE CHEST 1 VIEW COMPARISON:  08/12/2019 FINDINGS: The right-sided tunneled dialysis catheter is stable in positioning. Multiple stents are noted in the patient's left upper extremity. Heart size remains enlarged. There is vascular congestion with developing pulmonary edema. There is a moderate-sized left-sided pleural effusion with adjacent presumed atelectasis. IMPRESSION: 1. Stable positioning of the tunneled dialysis catheter. 2. Cardiomegaly with vascular congestion and possible early pulmonary edema. 3. Moderate-sized left-sided pleural effusion with adjacent compressive atelectasis.  Electronically Signed   By: Constance Holster M.D.   On: 08/25/2019 05:45     Medications:   . sodium chloride    . sodium chloride    . [START ON 08/26/2019] vancomycin     . Chlorhexidine Gluconate Cloth  6 each Topical Q0600  . feeding supplement (NEPRO CARB STEADY)  237 mL Oral BID BM  . levETIRAcetam  500 mg Oral BID   sodium chloride, sodium chloride, acetaminophen **OR** acetaminophen, alteplase, heparin, lidocaine (PF), lidocaine-prilocaine, ondansetron **OR** ondansetron (ZOFRAN) IV, pentafluoroprop-tetrafluoroeth  Assessment/ Plan:  43 y.o. male with end-stage renal disease on hemodialysis, hypertension, peripheral vascular disease, depression, PEA event 10/20.  [place] TTSLeft AVG  1.  ESRD on HD TTS.  Patient has missed last 2 dialysis sessions as an outpatient.  He states that transportation was again an issue for him.  We are planning for another dialysis session yesterday as he did not complete hemodialysis last night secondary to water RO issue.   2.  Hyperkalemia.  Serum potassium down to 5.4.  We will plan for 2K potassium bath today.  3.  Anemia chronic kidney disease.  Hemoglobin down to 8.8.  Platelets also noted to be low.  Vascular surgery planning platelet transfusion today.  We will start the patient on Epogen 10,000 units IV with dialysis.  4.  Secondary hyperparathyroidism.  Continue to monitor bone mineral metabolism parameters over the course of the hospitalization.  5.  Wound dehiscence at left upper extremity AV access  site.  Vascular surgery closely following and plans to take the patient to the OR tomorrow.   LOS: 1 Omkar Stratmann 11/3/202011:56 AM

## 2019-08-25 NOTE — Progress Notes (Signed)
This note also relates to the following rows which could not be included: Pulse Rate - Cannot attach notes to unvalidated device data Resp - Cannot attach notes to unvalidated device data SpO2 - Cannot attach notes to unvalidated device data  Hd started  

## 2019-08-25 NOTE — Progress Notes (Signed)
Post HD Assessment:    08/24/19 2330  Neurological  Level of Consciousness Alert  Orientation Level Oriented X4  Respiratory  Respiratory Pattern Regular;Unlabored  Chest Assessment Chest expansion symmetrical  Cardiac  Pulse Regular  Heart Sounds S1, S2  Vascular  R Radial Pulse +1  L Radial Pulse +1  Integumentary  Integumentary (WDL) X  Psychosocial  Psychosocial (WDL) X  Patient Behaviors Calm

## 2019-08-25 NOTE — Progress Notes (Signed)
This note also relates to the following rows which could not be included: Pulse Rate - Cannot attach notes to unvalidated device data Resp - Cannot attach notes to unvalidated device data BP - Cannot attach notes to unvalidated device data  Discontinued treatemnt dute to poor arterial flow from either side of Fem. Caht. Dr. Holley Raring notified.

## 2019-08-25 NOTE — Progress Notes (Signed)
This note also relates to the following rows which could not be included: Pulse Rate - Cannot attach notes to unvalidated device data Resp - Cannot attach notes to unvalidated device data  Hd started  

## 2019-08-25 NOTE — Progress Notes (Addendum)
Progress Note    Brett Small  [id number] DOB: 01-17-1976  DOA: 08/24/2019 PCP: Patient, [name]      Brief Narrative:    Medical records reviewed and are as summarized below:  Brett Small is an 43 y.o. male with medical history significant for end-stage renal disease, hypertension, depression, recent PEA cardiac arrest, PVD, chronic systolic CHF who presented to the hospital because of wound dehiscence with draining wound on the right arm and missed hemodialysis sessions.      Assessment/Plan:   Principal Problem:   Wound dehiscence, surgical, sequela Active Problems:   End stage renal disease on dialysis ([place])   Hypertension   MDD (major depressive disorder), single episode, severe , no psychosis ([place])   Malnutrition of moderate degree   Tobacco abuse   Marijuana use   PVD (peripheral vascular disease) (HCC)   Chronic systolic CHF (congestive heart failure) (HCC)   Volume overload   Hyperkalemia   Complications due to renal dialysis device, implant, and graft   Body mass index is 20.08 kg/m.    End-stage renal disease on hemodialysis on Tuesdays, Thursdays and Saturdays: Follow-up with nephrologist for hemodialysis  Hyperkalemia: Potassium improved  Severe thrombocytopenia: No active bleeding at this time.  Monitor platelet count.  Coagulopathy from Eliquis use: Patient was given vitamin K today by the vascular surgery team.  Right upper extremity wound dehiscence: Follow-up with vascular surgeon for surgical repair (likely AV graft removal and wound washout).  Analgesics as needed for pain.  Chronic systolic CHF/moderate to severe mitral regurgitation: EF less than 20%.  Scheduled hemodialysis for management of volume overload.  Questionable seizure disorder: Continue Keppra  PVD: He was on Eliquis prior to admission but this has been held.  Depression: He is not suicidal.  He may need antidepressants prior to discharge  Moderate protein  calorie malnutrition: Adequate oral intake advised.  Use of nutritional supplements.  Transfer to medical floor with telemetry.  Plan of care was discussed with his nurse.   Family Communication/Anticipated D/C date and plan/Code Status   DVT prophylaxis: Eliquis has been held for possible surgery in the next 1 to 2 days Code Status: Full code Family Communication: none Disposition Plan: Discharge to home in 2 to 3 days      Subjective:   C/o pain in right arm. He also has low back pain from laying in the bed so long  Objective:    Vitals:   08/25/19 1140 08/25/19 1145 08/25/19 1200 08/25/19 1230  BP:  (!) 166/108 (!) 161/103 (!) 175/100  Pulse: 93 91 91 100  Resp: 18 16 15 20   Temp: 98.6 F (37 C)   98.6 F (37 C)  TempSrc: Oral   Oral  SpO2:      Weight:      Height:        Intake/Output Summary (Last 24 hours) at 08/25/2019 1613 Last data filed at 08/25/2019 1500 Gross per 24 hour  Intake 120 ml  Output 364 ml  Net -244 ml   Filed Weights   08/24/19 1153 08/24/19 1807  Weight: 47.6 kg 48.2 kg    Exam:  GEN: NAD SKIN: open surgical wound on right arm. Erythematous maculopapular rash on left anterior chest wall around the left breast. Perm cath on right upper chest wall EYES: EOMI ENT: MMM CV: RRR PULM: CTA B ABD: soft, ND, NT, +BS CNS: AAO x 3, non focal EXT: Right arm  And forearm swelling and  tenderness without erythema   Data Reviewed:   I have personally reviewed following labs and imaging studies:  Labs: Labs show the following:   Basic Metabolic Panel: Recent Labs  Lab 08/24/19 1218 08/25/19 0251 08/25/19 1155  NA 133* 139  --   K 6.1* 5.4*  --   CL 93* 98  --   CO2 13* 23  --   GLUCOSE 129* 113*  --   BUN 124* 128*  --   CREATININE 8.14* 7.83*  --   CALCIUM 9.0 8.5*  --   MG  --  2.5*  --   PHOS  --   --  6.3*   GFR Estimated Creatinine Clearance: 8.1 mL/min (A) (by C-G formula based on SCr of 7.83 mg/dL (H)). Liver  Function Tests: Recent Labs  Lab 08/24/19 1218  AST 475*  ALT 515*  ALKPHOS 137*  BILITOT 3.2*  PROT 6.3*  ALBUMIN 3.3*   No results for input(s): LIPASE, AMYLASE in the last 168 hours. No results for input(s): AMMONIA in the last 168 hours. Coagulation profile Recent Labs  Lab 08/25/19 0251 08/25/19 1455  INR 2.1* 2.0*    CBC: Recent Labs  Lab 08/24/19 1218 08/25/19 0251 08/25/19 1455  WBC 12.4* 8.3 10.9*  NEUTROABS 10.2*  --   --   HGB 10.1* 8.8* 9.2*  HCT 32.1* 27.8* 28.7*  MCV 103.2* 101.8* 101.1*  PLT 51* 34* 35*   Cardiac Enzymes: No results for input(s): CKTOTAL, CKMB, CKMBINDEX, TROPONINI in the last 168 hours. BNP (last 3 results) No results for input(s): PROBNP in the last 8760 hours. CBG: Recent Labs  Lab 08/24/19 1806  GLUCAP 80   D-Dimer: No results for input(s): DDIMER in the last 72 hours. Hgb A1c: No results for input(s): HGBA1C in the last 72 hours. Lipid Profile: No results for input(s): CHOL, HDL, LDLCALC, TRIG, CHOLHDL, LDLDIRECT in the last 72 hours. Thyroid function studies: No results for input(s): TSH, T4TOTAL, T3FREE, THYROIDAB in the last 72 hours.  Invalid input(s): FREET3 Anemia work up: No results for input(s): VITAMINB12, FOLATE, FERRITIN, TIBC, IRON, RETICCTPCT in the last 72 hours. Sepsis Labs: Recent Labs  Lab 08/24/19 1218 08/25/19 0251 08/25/19 1455  WBC 12.4* 8.3 10.9*    Microbiology Recent Results (from the past 240 hour(s))  Aerobic/Anaerobic Culture (surgical/deep wound)     Status: None (Preliminary result)   Collection Time: 08/24/19 12:22 PM   Specimen: Wound  Result Value Ref Range Status   Specimen Description   Final    WOUND Performed at [facility], [address], [place], [place] [address]    Special Requests   Final    Normal Performed at [facility], [address] [place], [place] [address]    Gram Stain   Final    RARE WBC PRESENT, PREDOMINANTLY PMN RARE GRAM  POSITIVE COCCI IN PAIRS Performed at [facility], [place] [address], [place], [place] [address]    Culture PENDING  Incomplete   Report Status PENDING  Incomplete  SARS Coronavirus 2 by RT PCR (hospital order, performed in [facility] hospital lab) Nasopharyngeal Nasopharyngeal Swab     Status: None   Collection Time: 08/24/19  1:37 PM   Specimen: Nasopharyngeal Swab  Result Value Ref Range Status   SARS Coronavirus 2 NEGATIVE NEGATIVE Final    Comment: (NOTE) If result is NEGATIVE SARS-CoV-2 target nucleic acids are NOT DETECTED. The SARS-CoV-2 RNA is generally detectable in upper and lower  respiratory  specimens during the acute phase of infection. The lowest  concentration of SARS-CoV-2 viral copies this assay can detect is 250  copies / mL. A negative result does not preclude SARS-CoV-2 infection  and should not be used as the sole basis for treatment or other  patient management decisions.  A negative result may occur with  improper specimen collection / handling, submission of specimen other  than nasopharyngeal swab, presence of viral mutation(s) within the  areas targeted by this assay, and inadequate number of viral copies  (<250 copies / mL). A negative result must be combined with clinical  observations, patient history, and epidemiological information. If result is POSITIVE SARS-CoV-2 target nucleic acids are DETECTED. The SARS-CoV-2 RNA is generally detectable in upper and lower  respiratory specimens dur ing the acute phase of infection.  Positive  results are indicative of active infection with SARS-CoV-2.  Clinical  correlation with patient history and other diagnostic information is  necessary to determine patient infection status.  Positive results do  not rule out bacterial infection or co-infection with other viruses. If result is PRESUMPTIVE POSTIVE SARS-CoV-2 nucleic acids MAY BE PRESENT.   A presumptive positive result was obtained on the submitted  specimen  and confirmed on repeat testing.  While 2019 novel coronavirus  (SARS-CoV-2) nucleic acids may be present in the submitted sample  additional confirmatory testing may be necessary for epidemiological  and / or clinical management purposes  to differentiate between  SARS-CoV-2 and other Sarbecovirus currently known to infect humans.  If clinically indicated additional testing with an alternate test  methodology [phone]) is advised. The SARS-CoV-2 RNA is generally  detectable in upper and lower respiratory sp ecimens during the acute  phase of infection. The expected result is Negative. Fact Sheet for Patients:  StrictlyIdeas.no Fact Sheet for Healthcare Providers: BankingDealers.co.za This test is not yet approved or cleared by the [place] FDA and has been authorized for detection and/or diagnosis of SARS-CoV-2 by FDA under an Emergency Use Authorization (EUA).  This EUA will remain in effect (meaning this test can be used) for the duration of the COVID-19 declaration under Section 564(b)(1) of the Act, 21 U.S.C. section 360bbb-3(b)(1), unless the authorization is terminated or revoked sooner. Performed at [facility], [address] [place], [place] [address]     Procedures and diagnostic studies:  Dg Abd 1 View  Result Date: 08/25/2019 CLINICAL DATA:  Hemodialysis catheter malfunction. EXAM: ABDOMEN - 1 VIEW COMPARISON:  Plain film of the abdomen dated 08/06/2019. FINDINGS: Vascular stents are stable in position/configuration. There is a new RIGHT femoral catheter with tip projected upwards to the level of the L1 vertebral body. Bowel gas pattern is nonobstructive. No evidence of soft tissue mass or abnormal fluid collection. No evidence of free intraperitoneal air seen. Surgical clips in the LEFT lower quadrant. IMPRESSION: 1. New RIGHT femoral catheter with tip projected upwards along the expected course of  the IVC to the level of the L1 vertebral body. 2. Nonobstructive bowel gas pattern. Electronically Signed   By: Franki Cabot M.D.   On: 08/25/2019 14:55   US Venous Img Upper Uni Right  Result Date: 08/24/2019 CLINICAL DATA:  43 year old male with right upper extremity pain and swelling. Patient has a right upper extremity arteriovenous hemodialysis graft. EXAM: RIGHT UPPER EXTREMITY VENOUS DOPPLER ULTRASOUND TECHNIQUE: Gray-scale sonography with graded compression, as well as color Doppler and duplex ultrasound were performed to evaluate the upper extremity deep venous system from the level of  the subclavian vein and including the jugular, axillary, basilic, radial, ulnar and upper cephalic vein. Spectral Doppler was utilized to evaluate flow at rest and with distal augmentation maneuvers. COMPARISON:  None. FINDINGS: Contralateral Subclavian Vein: Respiratory phasicity is normal and symmetric with the symptomatic side. No evidence of thrombus. Normal compressibility. Internal Jugular Vein: No evidence of thrombus. Normal compressibility, respiratory phasicity and response to augmentation. Subclavian Vein: No evidence of thrombus. Normal compressibility, respiratory phasicity and response to augmentation. Axillary Vein: No evidence of thrombus. Normal compressibility, respiratory phasicity and response to augmentation. Cephalic Vein: No evidence of thrombus. Normal compressibility, respiratory phasicity and response to augmentation. Basilic Vein: No evidence of thrombus. Normal compressibility, respiratory phasicity and response to augmentation. Brachial Veins: No evidence of thrombus. Normal compressibility, respiratory phasicity and response to augmentation. Radial Veins: One of the paired radial veins is not compressible and demonstrates no evidence of flow on color Doppler imaging. Findings are consistent with thrombosis. Ulnar Veins: No evidence of thrombus. Normal compressibility, respiratory phasicity  and response to augmentation. Venous Reflux:  None visualized. Other Findings: There is a large complex heterogeneous fluid collection in the region of the axilla surrounding the arteriovenous graft. The collection is difficult to measure given its amorphous nature. An estimated measurement is 4.2 x 1.5 by 6.5 cm IMPRESSION: 1. Positive for acute occlusive deep venous thrombosis in 1 of the paired radial veins in the forearm. 2. Large complex fluid collection surrounding the arteriovenous graft in the axilla. Differential considerations include hematoma and abscess. 3. The arteriovenous graft remains patent. No evidence of internal thrombus. Signed, Criselda Peaches, MD, [facility] Electronically Signed   By: Jacqulynn Cadet M.D.   On: 08/24/2019 14:41   Dg Chest Port 1 View  Result Date: 08/25/2019 CLINICAL DATA:  Displacement of central venous catheter. EXAM: PORTABLE CHEST 1 VIEW COMPARISON:  08/12/2019 FINDINGS: The right-sided tunneled dialysis catheter is stable in positioning. Multiple stents are noted in the patient's left upper extremity. Heart size remains enlarged. There is vascular congestion with developing pulmonary edema. There is a moderate-sized left-sided pleural effusion with adjacent presumed atelectasis. IMPRESSION: 1. Stable positioning of the tunneled dialysis catheter. 2. Cardiomegaly with vascular congestion and possible early pulmonary edema. 3. Moderate-sized left-sided pleural effusion with adjacent compressive atelectasis. Electronically Signed   By: Constance Holster M.D.   On: 08/25/2019 05:45    Medications:    Chlorhexidine Gluconate Cloth  6 each Topical Q0600   [START ON 08/26/2019] Chlorhexidine Gluconate Cloth  6 each Topical Once   And   [START ON 08/26/2019] Chlorhexidine Gluconate Cloth  6 each Topical Once   feeding supplement (NEPRO CARB STEADY)  237 mL Oral BID BM   levETIRAcetam  500 mg  Oral BID   Continuous Infusions:  [START ON 08/26/2019] vancomycin       LOS: 1 day   Brett Small  [facility] Pager [phone].   *Please refer to [email or web address], password [email or web address] to get updated schedule on who will round on this patient, as hospitalists switch teams weekly. If 7PM-7AM, please contact night-coverage at [email or web address], password [email or web address] for any overnight needs.  08/25/2019, 4:13 PM

## 2019-08-25 NOTE — Progress Notes (Signed)
Verified with PA that pt was to get 2 units FFP tonight during HD and 2 units of FFP on hold for AM in OR. Notified Blood bank of same.

## 2019-08-25 NOTE — Progress Notes (Signed)
Established hemodialysis patient known at [place] 11:00, transports with [facility]. Patient states missing a few treatments due to missing his ride because he was getting his other appointments mixed up in his head.  Please contact me with any dialysis placement concerns.  [name] Dialysis Coordinator [phone]

## 2019-08-25 NOTE — Progress Notes (Signed)
Right IJ perm cath cleaned with CHG and transparent dressing applied per Dr Elwyn Lade instruction.

## 2019-08-25 NOTE — Progress Notes (Signed)
Pt tx to HD via bed by transport.

## 2019-08-25 NOTE — Progress Notes (Signed)
This note also relates to the following rows which could not be included: Pulse Rate - Cannot attach notes to unvalidated device data Resp - Cannot attach notes to unvalidated device data BP - Cannot attach notes to unvalidated device data SpO2 - Cannot attach notes to unvalidated device data    08/25/19 2130  During Hemodialysis Assessment  Blood Flow Rate (mL/min) 350 mL/min  Arterial Pressure (mmHg) -80 mmHg  Venous Pressure (mmHg) 90 mmHg  Transmembrane Pressure (mmHg) 80 mmHg  Ultrafiltration Rate (mL/min) 830 mL/min  Dialysate Flow Rate (mL/min) 800 ml/min  Conductivity: Machine  14  HD Safety Checks Performed Yes  KECN 61.7 KECN  Dialysis Fluid Bolus Normal Saline  Bolus Amount (mL) 250 mL  Intra-Hemodialysis Comments Tolerated well;Tx completed  Post-Hemodialysis Assessment  Rinseback Volume (mL) 250 mL  KECN 61.7 V  Dialyzer Clearance Clear  Duration of HD Treatment -hour(s) 3 hour(s)  Hemodialysis Intake (mL) 500 mL  UF Total -Machine (mL) 2510 mL  Net UF (mL) 2010 mL  Tolerated HD Treatment Yes  TX COMPLETE TOLERATED WELL NO C/OS NO DISTRESS NOTED PAIN RELIEVED VITALS STABLE CVC WDL FUNCTIONING WELL UFG 2L

## 2019-08-25 NOTE — Progress Notes (Signed)
HD Completed:    08/24/19 2310  During Hemodialysis Assessment  Dialysis Fluid Bolus Normal Saline  Bolus Amount (mL) 0 mL  Intra-Hemodialysis Comments Tx completed

## 2019-08-25 NOTE — Progress Notes (Signed)
Initial Nutrition Assessment  DOCUMENTATION CODES:   Non-severe (moderate) malnutrition in context of chronic illness  INTERVENTION:  Once diet advanced resume Nepro Shake po BID, each supplement provides 425 kcal and 19 grams protein.  Provide Rena-vite QHS once patient able to take PO.  Also provide vitamin C 250 mg BID once able to take PO.  NUTRITION DIAGNOSIS:   Moderate Malnutrition related to chronic illness(ESRD on HD, CHF) as evidenced by moderate fat depletion, mild-moderate muscle depletion.  GOAL:   Patient will meet greater than or equal to 90% of their needs  MONITOR:   Diet advancement, PO intake, Supplement acceptance, Labs, Weight trends, Skin, I & O's  REASON FOR ASSESSMENT:   Consult Assessment of nutrition requirement/status  ASSESSMENT:   43 year old male with PMHx of HTN, ESRD on HD, secondary hyperparathyroidism, PVD, PAD, recent PEA cardiac arrest, chronic systolic CHF admitted after missing several HD sessions also with right upper extremity wound dehiscence.   Met with patient at bedside. He is known to this RD from previous admissions. Patient is not a very good historian. He reports his appetite and intake were decreased PTA. He reports he was eating 1-2 meals per day and was receiving protein shakes but was unable to describe intake any further. He continued to report his right arm was hurting. Patient ate 100% of his dinner last night before being made NPO today. Per discussion in rounds this morning plan is to advance diet once patient was more alert. Patient is at risk for vitamin C deficiency in setting of decreased appetite/intake, malnutrition, increased losses from HD, and signs of deficiency including ecchymosis, petechiae, and wound dehiscence.   Patient was 50.9 kg on 05/21/2019. His weight decreased to 43.5 kg on 08/17/2019 but is now up to 48.2 kg which may be falsely elevated from fluid.  Medications reviewed and include: Nepro BID,  Keppra, vancomycin.  Labs reviewed: Potassium 5.4, BUN 128, Creatinine 7.83, Magnesium 2.5, Phosphorus 6.3.  Discussed with RN and on rounds.  NUTRITION - FOCUSED PHYSICAL EXAM:    Most Recent Value  Orbital Region  Moderate depletion  Upper Arm Region  Moderate depletion  Thoracic and Lumbar Region  Mild depletion  Buccal Region  Moderate depletion  Temple Region  Moderate depletion  Clavicle Bone Region  Mild depletion  Clavicle and Acromion Bone Region  Mild depletion  Scapular Bone Region  Moderate depletion  Dorsal Hand  Mild depletion  Patellar Region  Moderate depletion  Anterior Thigh Region  Moderate depletion  Posterior Calf Region  Moderate depletion  Edema (RD Assessment)  Mild  Hair  Reviewed  Eyes  Reviewed  Mouth  Unable to assess  Skin  Reviewed [ecchymosis and petechiae]  Nails  Reviewed     Diet Order:   Diet Order            Diet NPO time specified  Diet effective midnight             EDUCATION NEEDS:   Not appropriate for education at this time  Skin:  Skin Assessment: Skin Integrity Issues:(wound dehiscence right axilla; ecchymosis; petechiae)  Last BM:  08/23/2019 per chart  Height:   Ht Readings from Last 1 Encounters:  08/24/19 _0  (1.549 m)   Weight:   Wt Readings from Last 1 Encounters:  08/24/19 48.2 kg   Ideal Body Weight:  50.9 kg  BMI:  Body mass index is 20.08 kg/m.  Estimated Nutritional Needs:   Kcal:  1550-1750  Protein:  80-90 grams  Fluid:  UOP + 1 L  Jacklynn Barnacle, MS, RD, LDN Office: [phone] Pager: [phone] After Hours/Weekend Pager: [phone]

## 2019-08-25 NOTE — Op Note (Signed)
  OPERATIVE NOTE   PROCEDURE: 1. Insertion of temporary dialysis catheter catheter right femoral approach.  PRE-OPERATIVE DIAGNOSIS: Complication dialysis device; end-stage renal disease requiring hemodialysis  POST-OPERATIVE DIAGNOSIS: Same  SURGEON: Katha Cabal M.D.  ANESTHESIA: 1% lidocaine local infiltration  ESTIMATED BLOOD LOSS: Minimal cc  INDICATIONS:   Brett Small is a 43 y.o. male who presents with infected right arm brachial axillary AV graft his tunneled catheter appears to have been pulled out with the cuff nearly exposed and therefore he is undergoing placement of a temporary catheter so that dialysis can be performed.  Risks and benefits of been reviewed all questions answered patient agrees to proceed..  DESCRIPTION: After obtaining full informed written consent, the patient was positioned supine. The right groin was prepped and draped in a sterile fashion. Ultrasound was placed in a sterile sleeve. Ultrasound was utilized to identify the right common femoral vein which is noted to be echolucent and compressible indicating patency. Images recorded for the permanent record. Under real-time visualization a Seldinger needle is inserted into the vein and the guidewires advanced without difficulty. Small counterincision was made at the wire insertion site. Dilator is passed over the wire and the temporary dialysis catheter catheter is fed over the wire without difficulty.  All lumens aspirate and flush easily and are packed with heparin saline. Catheter secured to the skin of the right thigh with 2-0 silk. A sterile dressing is applied with Biopatch.  COMPLICATIONS: None  CONDITION: Unchanged  [name] Office:  [phone] 08/25/2019, 8:00 PM

## 2019-08-25 NOTE — Progress Notes (Signed)
HD Initiated:    08/24/19 2245  Vital Signs  Temp (!) 97.5 F (36.4 C)  Temp Source Oral  Pulse Rate 86  Resp 14  BP (!) 143/86  Oxygen Therapy  SpO2 100 %  O2 Device Room Air  During Hemodialysis Assessment  Blood Flow Rate (mL/min) 400 mL/min  Arterial Pressure (mmHg) -90 mmHg  Venous Pressure (mmHg) 120 mmHg  Transmembrane Pressure (mmHg) 60 mmHg  Ultrafiltration Rate (mL/min) 890 mL/min  Dialysate Flow Rate (mL/min) 800 ml/min  Conductivity: Machine  14  HD Safety Checks Performed Yes  Dialysis Fluid Bolus Normal Saline  Bolus Amount (mL) 250 mL  Intra-Hemodialysis Comments Tx initiated

## 2019-08-26 ENCOUNTER — Inpatient Hospital Stay: Payer: Medicare Other

## 2019-08-26 ENCOUNTER — Encounter
Admission: EM | Disposition: A | Discharge status: Home Health | Payer: Self-pay | Source: Home / Self Care | Attending: Family Medicine

## 2019-08-26 ENCOUNTER — Encounter: Payer: Self-pay | Admitting: Vascular Surgery

## 2019-08-26 DIAGNOSIS — Z992 Dependence on renal dialysis: Secondary | ICD-10-CM

## 2019-08-26 DIAGNOSIS — T82898A Other specified complication of vascular prosthetic devices, implants and grafts, initial encounter: Secondary | ICD-10-CM

## 2019-08-26 DIAGNOSIS — N186 End stage renal disease: Secondary | ICD-10-CM

## 2019-08-26 DIAGNOSIS — F322 Major depressive disorder, single episode, severe without psychotic features: Secondary | ICD-10-CM

## 2019-08-26 DIAGNOSIS — I10 Essential (primary) hypertension: Secondary | ICD-10-CM

## 2019-08-26 HISTORY — PX: REMOVAL OF A DIALYSIS CATHETER: SHX6053

## 2019-08-26 LAB — CBC
HCT: 25.9 % — ABNORMAL LOW (ref 39.0–52.0)
HCT: 26.5 % — ABNORMAL LOW (ref 39.0–52.0)
Hemoglobin: 8.2 g/dL — ABNORMAL LOW (ref 13.0–17.0)
Hemoglobin: 8.4 g/dL — ABNORMAL LOW (ref 13.0–17.0)
MCH: 31.8 pg (ref 26.0–34.0)
MCH: 32.4 pg (ref 26.0–34.0)
MCHC: 31.7 g/dL (ref 30.0–36.0)
MCHC: 31.7 g/dL (ref 30.0–36.0)
MCV: 100.4 fL — ABNORMAL HIGH (ref 80.0–100.0)
MCV: 102.3 fL — ABNORMAL HIGH (ref 80.0–100.0)
Platelets: 29 10*3/uL — CL (ref 150–400)
Platelets: 108 10*3/uL — ABNORMAL LOW (ref 150–400)
RBC: 2.58 MIL/uL — ABNORMAL LOW (ref 4.22–5.81)
RBC: 2.59 MIL/uL — ABNORMAL LOW (ref 4.22–5.81)
RDW: 20.1 % — ABNORMAL HIGH (ref 11.5–15.5)
RDW: 20.3 % — ABNORMAL HIGH (ref 11.5–15.5)
WBC: 6.6 10*3/uL (ref 4.0–10.5)
WBC: 7.4 10*3/uL (ref 4.0–10.5)
nRBC: 1.4 % — ABNORMAL HIGH (ref 0.0–0.2)
nRBC: 0.5 % — ABNORMAL HIGH (ref 0.0–0.2)

## 2019-08-26 LAB — VITAMIN B12: Vitamin B-12: 7110 pg/mL — ABNORMAL HIGH (ref 180–914)

## 2019-08-26 LAB — HEPATIC FUNCTION PANEL
ALT: 322 U/L — ABNORMAL HIGH (ref 0–44)
AST: 162 U/L — ABNORMAL HIGH (ref 15–41)
Albumin: 2.7 g/dL — ABNORMAL LOW (ref 3.5–5.0)
Alkaline Phosphatase: 107 U/L (ref 38–126)
Bilirubin, Direct: 0.9 mg/dL — ABNORMAL HIGH (ref 0.0–0.2)
Indirect Bilirubin: 1.6 mg/dL — ABNORMAL HIGH (ref 0.3–0.9)
Total Bilirubin: 2.5 mg/dL — ABNORMAL HIGH (ref 0.3–1.2)
Total Protein: 5.2 g/dL — ABNORMAL LOW (ref 6.5–8.1)

## 2019-08-26 LAB — RETIC PANEL
Immature Retic Fract: 35.3 % — ABNORMAL HIGH (ref 2.3–15.9)
RBC.: 2.56 MIL/uL — ABNORMAL LOW (ref 4.22–5.81)
Retic Count, Absolute: 153.1 10*3/uL (ref 19.0–186.0)
Retic Ct Pct: 6 % — ABNORMAL HIGH (ref 0.4–3.1)
Reticulocyte Hemoglobin: 28.8 pg (ref >27.9)

## 2019-08-26 LAB — BASIC METABOLIC PANEL
Anion gap: 12 (ref 5–15)
BUN: 47 mg/dL — ABNORMAL HIGH (ref 6–20)
CO2: 24 mmol/L (ref 22–32)
Calcium: 8 mg/dL — ABNORMAL LOW (ref 8.9–10.3)
Chloride: 102 mmol/L (ref 98–111)
Creatinine, Ser: 3.92 mg/dL — ABNORMAL HIGH (ref 0.61–1.24)
GFR calc Af Amer: 20 mL/min — ABNORMAL LOW (ref >60)
GFR calc non Af Amer: 17 mL/min — ABNORMAL LOW (ref >60)
Glucose, Bld: 135 mg/dL — ABNORMAL HIGH (ref 70–99)
Potassium: 3.4 mmol/L — ABNORMAL LOW (ref 3.5–5.1)
Sodium: 138 mmol/L (ref 135–145)

## 2019-08-26 LAB — FOLATE: Folate: 14.9 ng/mL (ref >5.9)

## 2019-08-26 LAB — PARATHYROID HORMONE, INTACT (NO CA): PTH: 126 pg/mL — ABNORMAL HIGH (ref 15–65)

## 2019-08-26 LAB — PROTIME-INR
INR: 1.6 — ABNORMAL HIGH (ref 0.8–1.2)
Prothrombin Time: 19 seconds — ABNORMAL HIGH (ref 11.4–15.2)

## 2019-08-26 LAB — MAGNESIUM: Magnesium: 1.9 mg/dL (ref 1.7–2.4)

## 2019-08-26 LAB — APTT: aPTT: 37 seconds — ABNORMAL HIGH (ref 24–36)

## 2019-08-26 LAB — PATHOLOGIST SMEAR REVIEW

## 2019-08-26 LAB — LACTATE DEHYDROGENASE: LDH: 258 U/L — ABNORMAL HIGH (ref 98–192)

## 2019-08-26 LAB — IMMATURE PLATELET FRACTION: Immature Platelet Fraction: 7.6 % (ref 1.2–8.6)

## 2019-08-26 SURGERY — REMOVAL, DIALYSIS CATHETER
Anesthesia: General | Laterality: Right

## 2019-08-26 MED ORDER — OXYCODONE HCL 5 MG PO TABS: 5.0000 mg | ORAL_TABLET | Freq: Once | ORAL | Status: DC | PRN

## 2019-08-26 MED ORDER — LIDOCAINE HCL (CARDIAC) PF 100 MG/5ML IV SOSY
PREFILLED_SYRINGE | INTRAVENOUS | Status: DC | PRN
  Administered 2019-08-26: 60 mg via INTRAVENOUS

## 2019-08-26 MED ORDER — EPINEPHRINE PF 1 MG/ML IJ SOLN
INTRAMUSCULAR | Status: AC
  Filled 2019-08-26: qty 1

## 2019-08-26 MED ORDER — PHENYLEPHRINE HCL (PRESSORS) 10 MG/ML IV SOLN
INTRAVENOUS | Status: AC
  Filled 2019-08-26: qty 1

## 2019-08-26 MED ORDER — MIDAZOLAM HCL 2 MG/2ML IJ SOLN
INTRAMUSCULAR | Status: DC | PRN
  Administered 2019-08-26: 2 mg via INTRAVENOUS

## 2019-08-26 MED ORDER — BUPIVACAINE LIPOSOME 1.3 % IJ SUSP
INTRAMUSCULAR | Status: AC
  Filled 2019-08-26: qty 20

## 2019-08-26 MED ORDER — FENTANYL CITRATE (PF) 100 MCG/2ML IJ SOLN
25.0000 ug | INTRAMUSCULAR | Status: DC | PRN
  Administered 2019-08-26 (×2): 25 ug via INTRAVENOUS

## 2019-08-26 MED ORDER — PHENYLEPHRINE HCL (PRESSORS) 10 MG/ML IV SOLN
INTRAVENOUS | Status: DC | PRN
  Administered 2019-08-26: 100 ug via INTRAVENOUS

## 2019-08-26 MED ORDER — ROCURONIUM BROMIDE 100 MG/10ML IV SOLN
INTRAVENOUS | Status: DC | PRN
  Administered 2019-08-26: 40 mg via INTRAVENOUS

## 2019-08-26 MED ORDER — SUGAMMADEX SODIUM 200 MG/2ML IV SOLN
INTRAVENOUS | Status: AC
  Filled 2019-08-26: qty 6

## 2019-08-26 MED ORDER — ROCURONIUM BROMIDE 50 MG/5ML IV SOLN
INTRAVENOUS | Status: AC
  Filled 2019-08-26: qty 4

## 2019-08-26 MED ORDER — OXYCODONE HCL 5 MG/5ML PO SOLN: 5.0000 mg | Freq: Once | ORAL | Status: DC | PRN

## 2019-08-26 MED ORDER — MIDAZOLAM HCL 2 MG/2ML IJ SOLN
INTRAMUSCULAR | Status: AC
  Filled 2019-08-26: qty 2

## 2019-08-26 MED ORDER — FENTANYL CITRATE (PF) 100 MCG/2ML IJ SOLN
INTRAMUSCULAR | Status: DC | PRN
  Administered 2019-08-26: 50 ug via INTRAVENOUS

## 2019-08-26 MED ORDER — NOREPINEPHRINE BITARTRATE 1 MG/ML IV SOLN
INTRAVENOUS | Status: AC
  Filled 2019-08-26: qty 4

## 2019-08-26 MED ORDER — SODIUM CHLORIDE 0.9 % IV SOLN
INTRAVENOUS | Status: DC | PRN
  Administered 2019-08-26: 30 ug/min via INTRAVENOUS

## 2019-08-26 MED ORDER — CEFAZOLIN SODIUM-DEXTROSE 1-4 GM/50ML-% IV SOLN
INTRAVENOUS | Status: DC | PRN
  Administered 2019-08-26: 1 g via INTRAVENOUS

## 2019-08-26 MED ORDER — EVICEL 5 ML EX KIT
PACK | CUTANEOUS | Status: DC | PRN
  Administered 2019-08-26: 1 via TOPICAL

## 2019-08-26 MED ORDER — CEFAZOLIN SODIUM-DEXTROSE 1-4 GM/50ML-% IV SOLN
INTRAVENOUS | Status: AC
  Filled 2019-08-26: qty 50

## 2019-08-26 MED ORDER — SODIUM CHLORIDE 0.9 % IV SOLN
INTRAVENOUS | Status: DC | PRN
  Administered 2019-08-26: 11:00:00 via INTRAVENOUS

## 2019-08-26 MED ORDER — SUCCINYLCHOLINE CHLORIDE 20 MG/ML IJ SOLN
INTRAMUSCULAR | Status: AC
  Filled 2019-08-26: qty 2

## 2019-08-26 MED ORDER — DEXAMETHASONE SODIUM PHOSPHATE 10 MG/ML IJ SOLN
INTRAMUSCULAR | Status: DC | PRN
  Administered 2019-08-26: 10 mg via INTRAVENOUS

## 2019-08-26 MED ORDER — LIDOCAINE HCL (PF) 2 % IJ SOLN
INTRAMUSCULAR | Status: AC
  Filled 2019-08-26: qty 30

## 2019-08-26 MED ORDER — SUGAMMADEX SODIUM 200 MG/2ML IV SOLN
INTRAVENOUS | Status: DC | PRN
  Administered 2019-08-26: 100 mg via INTRAVENOUS

## 2019-08-26 MED ORDER — GLYCOPYRROLATE 0.2 MG/ML IJ SOLN
INTRAMUSCULAR | Status: AC
  Filled 2019-08-26: qty 3

## 2019-08-26 MED ORDER — SODIUM CHLORIDE FLUSH 0.9 % IV SOLN
INTRAVENOUS | Status: AC
  Filled 2019-08-26: qty 30

## 2019-08-26 MED ORDER — FENTANYL CITRATE (PF) 100 MCG/2ML IJ SOLN
INTRAMUSCULAR | Status: AC
  Administered 2019-08-26: 13:00:00 25 ug via INTRAVENOUS
  Filled 2019-08-26: qty 2

## 2019-08-26 MED ORDER — EPINEPHRINE HCL 5 MG/250ML IV SOLN IN NS
0.5000 ug/min | INTRAVENOUS | Status: DC
  Filled 2019-08-26: qty 250

## 2019-08-26 MED ORDER — SUGAMMADEX SODIUM 500 MG/5ML IV SOLN
INTRAVENOUS | Status: AC
  Filled 2019-08-26: qty 5

## 2019-08-26 MED ORDER — NOREPINEPHRINE 4 MG/250ML-% IV SOLN
0.0000 ug/min | INTRAVENOUS | Status: DC
  Filled 2019-08-26: qty 250

## 2019-08-26 MED ORDER — ETOMIDATE 2 MG/ML IV SOLN
INTRAVENOUS | Status: DC | PRN
  Administered 2019-08-26: 10 mg via INTRAVENOUS

## 2019-08-26 MED ORDER — HEPARIN SODIUM (PORCINE) 5000 UNIT/ML IJ SOLN
INTRAMUSCULAR | Status: AC
  Filled 2019-08-26: qty 1

## 2019-08-26 MED ORDER — AMLODIPINE BESYLATE 5 MG PO TABS
5.0000 mg | ORAL_TABLET | Freq: Every day | ORAL | Status: DC
  Administered 2019-08-26 – 2019-08-28 (×3): 5 mg via ORAL
  Filled 2019-08-26 (×5): qty 1

## 2019-08-26 MED ORDER — DEXMEDETOMIDINE HCL IN NACL 80 MCG/20ML IV SOLN
INTRAVENOUS | Status: AC
  Filled 2019-08-26: qty 20

## 2019-08-26 MED ORDER — FENTANYL CITRATE (PF) 100 MCG/2ML IJ SOLN
INTRAMUSCULAR | Status: AC
  Filled 2019-08-26: qty 2

## 2019-08-26 MED ORDER — ONDANSETRON HCL 4 MG/2ML IJ SOLN
INTRAMUSCULAR | Status: AC
  Filled 2019-08-26: qty 12

## 2019-08-26 MED ORDER — BUPIVACAINE HCL (PF) 0.5 % IJ SOLN
INTRAMUSCULAR | Status: AC
  Filled 2019-08-26: qty 30

## 2019-08-26 MED ORDER — GLYCOPYRROLATE 0.2 MG/ML IJ SOLN
INTRAMUSCULAR | Status: DC | PRN
  Administered 2019-08-26: 0.2 mg via INTRAVENOUS

## 2019-08-26 MED ORDER — EPHEDRINE SULFATE 50 MG/ML IJ SOLN
INTRAMUSCULAR | Status: AC
  Filled 2019-08-26: qty 3

## 2019-08-26 MED ORDER — CALCIUM CHLORIDE 10 % IV SOLN
INTRAVENOUS | Status: AC
  Filled 2019-08-26: qty 10

## 2019-08-26 MED ORDER — SODIUM CHLORIDE 0.9 % IV SOLN
INTRAVENOUS | Status: DC | PRN
  Administered 2019-08-26: 100 mL via INTRAMUSCULAR

## 2019-08-26 MED ORDER — ALBUMIN HUMAN 5 % IV SOLN
INTRAVENOUS | Status: AC
  Filled 2019-08-26: qty 500

## 2019-08-26 MED ORDER — ESMOLOL HCL 100 MG/10ML IV SOLN
INTRAVENOUS | Status: AC
  Filled 2019-08-26: qty 20

## 2019-08-26 MED ORDER — PAPAVERINE HCL 30 MG/ML IJ SOLN
INTRAMUSCULAR | Status: AC
  Filled 2019-08-26: qty 2

## 2019-08-26 MED ORDER — METOPROLOL TARTRATE 5 MG/5ML IV SOLN
INTRAVENOUS | Status: AC
  Filled 2019-08-26: qty 5

## 2019-08-26 SURGICAL SUPPLY — 37 items
BNDG ELASTIC 4X5.8 VLCR STR LF (GAUZE/BANDAGES/DRESSINGS) ×4 IMPLANT
BNDG GAUZE 4.5X4.1 6PLY STRL (MISCELLANEOUS) ×2 IMPLANT
BRUSH SCRUB EZ  4% CHG (MISCELLANEOUS) ×2
BRUSH SCRUB EZ 4% CHG (MISCELLANEOUS) IMPLANT
CANISTER SUCT 1200ML W/VALVE (MISCELLANEOUS) ×3 IMPLANT
COVER WAND RF STERILE (DRAPES) ×3 IMPLANT
DERMABOND ADVANCED (GAUZE/BANDAGES/DRESSINGS) ×2
DERMABOND ADVANCED .7 DNX12 (GAUZE/BANDAGES/DRESSINGS) ×1 IMPLANT
DRAPE LAPAROTOMY TRNSV 106X77 (MISCELLANEOUS) ×3 IMPLANT
ELECT REM PT RETURN 9FT ADLT (ELECTROSURGICAL) ×3
ELECTRODE REM PT RTRN 9FT ADLT (ELECTROSURGICAL) ×1 IMPLANT
GAUZE SPONGE 4X4 12PLY STRL (GAUZE/BANDAGES/DRESSINGS) ×5 IMPLANT
GAUZE XEROFORM 1X8 LF (GAUZE/BANDAGES/DRESSINGS) ×2 IMPLANT
GLOVE BIO SURGEON STRL SZ7 (GLOVE) ×7 IMPLANT
GOWN STRL REUS W/ TWL LRG LVL3 (GOWN DISPOSABLE) ×1 IMPLANT
GOWN STRL REUS W/ TWL XL LVL3 (GOWN DISPOSABLE) ×1 IMPLANT
GOWN STRL REUS W/TWL LRG LVL3 (GOWN DISPOSABLE) ×6
GOWN STRL REUS W/TWL XL LVL3 (GOWN DISPOSABLE) ×2
KIT TURNOVER KIT A (KITS) ×3 IMPLANT
LABEL OR SOLS (LABEL) ×3 IMPLANT
NDL HYPO 25X1 1.5 SAFETY (NEEDLE) ×1 IMPLANT
NEEDLE HYPO 25X1 1.5 SAFETY (NEEDLE) ×3 IMPLANT
NS IRRIG 500ML POUR BTL (IV SOLUTION) ×3 IMPLANT
PACK BASIN MINOR ARMC (MISCELLANEOUS) ×3 IMPLANT
PAD ABD DERMACEA PRESS 5X9 (GAUZE/BANDAGES/DRESSINGS) ×6 IMPLANT
PATCH CAROTID ECM VASC 1X10 (Prosthesis & Implant Heart) ×2 IMPLANT
SOL PREP PVP 2OZ (MISCELLANEOUS) ×3
SOLUTION PREP PVP 2OZ (MISCELLANEOUS) IMPLANT
SPONGE LAP 18X18 RF (DISPOSABLE) ×2 IMPLANT
SUT ETHILON 3-0 FS-10 30 BLK (SUTURE) ×6
SUT MNCRL 5-0+ PC-1 (SUTURE) IMPLANT
SUT MNCRL AB 4-0 PS2 18 (SUTURE) ×1 IMPLANT
SUT MONOCRYL 5-0 (SUTURE) ×2
SUT PROLENE 6 0 BV (SUTURE) ×8 IMPLANT
SUT VICRYL+ 3-0 36IN CT-1 (SUTURE) ×5 IMPLANT
SUTURE EHLN 3-0 FS-10 30 BLK (SUTURE) IMPLANT
SYR 10ML LL (SYRINGE) ×3 IMPLANT

## 2019-08-26 NOTE — Anesthesia Procedure Notes (Signed)
Procedure Name: Intubation Performed by: Kelton Pillar, CRNA Pre-anesthesia Checklist: Patient identified, Emergency Drugs available, Suction available and Patient being monitored Patient Re-evaluated:Patient Re-evaluated prior to induction Oxygen Delivery Method: Circle system utilized Preoxygenation: Pre-oxygenation with 100% oxygen Induction Type: IV induction Ventilation: Mask ventilation without difficulty Laryngoscope Size: McGraph and 3 Grade View: Grade I Tube type: Oral Tube size: 6.0 mm Number of attempts: 1 Airway Equipment and Method: Stylet Placement Confirmation: ETT inserted through vocal cords under direct vision,  positive ETCO2,  CO2 detector and breath sounds checked- equal and bilateral Secured at: 21 cm Tube secured with: Tape Dental Injury: Teeth and Oropharynx as per pre-operative assessment

## 2019-08-26 NOTE — Anesthesia Post-op Follow-up Note (Signed)
Anesthesia QCDR form completed.        

## 2019-08-26 NOTE — Plan of Care (Signed)
Received Pt from dialysis, A/Ox3, Hypertensive, MD on call was notified, see new BP medication, denies pain, NPO status maintained for OR this morning, R femoral hemodialysis cath intact safety measures maintained, OR bath completed, will continue to monitor  Problem: Education: Goal: Knowledge of General Education information will improve Description: Including pain rating scale, medication(s)/side effects and non-pharmacologic comfort measures Outcome: Progressing   Problem: Health Behavior/Discharge Planning: Goal: Ability to manage health-related needs will improve Outcome: Progressing   Problem: Clinical Measurements: Goal: Ability to maintain clinical measurements within normal limits will improve Outcome: Progressing Goal: Will remain free from infection Outcome: Progressing Goal: Diagnostic test results will improve Outcome: Progressing   Problem: Activity: Goal: Risk for activity intolerance will decrease Outcome: Progressing   Problem: Nutrition: Goal: Adequate nutrition will be maintained Outcome: Progressing   Problem: Pain Managment: Goal: General experience of comfort will improve Outcome: Progressing   Problem: Safety: Goal: Ability to remain free from injury will improve Outcome: Progressing   Problem: Skin Integrity: Goal: Risk for impaired skin integrity will decrease Outcome: Progressing

## 2019-08-26 NOTE — Progress Notes (Signed)
[facility]  ROUNDING NOTE   Subjective:  Patient did successfully undergo hemodialysis yesterday. Due for vascular surgical intervention today. Has history of recent wound dehiscence in his left upper extremity AV access as well as mildly displaced PermCath.   Objective:  Vital signs in last 24 hours:  Temp:  [97.6 F (36.4 C)-98.6 F (37 C)] 97.7 F (36.5 C) (11/04 0921) Pulse Rate:  [88-103] 89 (11/04 0921) Resp:  [14-21] 18 (11/04 0921) BP: (137-207)/(79-110) 137/79 (11/04 0921) SpO2:  [90 %-98 %] 94 % (11/04 0921) Weight:  [47.6 kg] 47.6 kg (11/04 0921)  Weight change:  Filed Weights   08/24/19 1153 08/24/19 1807 08/26/19 0921  Weight: 47.6 kg 48.2 kg 47.6 kg    Intake/Output: I/O last 3 completed shifts: In: 120 [P.O.:120] Out: 2374 [Urine:225; [id number]   Intake/Output this shift:  No intake/output data recorded.  Physical Exam: General: Chronically ill-appearing  Head: Normocephalic, atraumatic. Moist oral mucosal membranes  Eyes: Anicteric  Neck: Supple, trachea midline  Lungs:  Clear to auscultation, normal effort  Heart: S1S2 no rubs  Abdomen:  Soft, nontender, bowel sounds present  Extremities: Trace peripheral edema.  Neurologic: Awake, alert, following commands  Skin: No lesions  Access: R IJ permcath, cuff right at exit site, LUE AV access wrapped, femoral temporary dialysis catheter    Basic Metabolic Panel: Recent Labs  Lab 08/24/19 1218 08/25/19 0251 08/25/19 1155 08/26/19 0426  NA 133* 139  --  138  K 6.1* 5.4*  --  3.4*  CL 93* 98  --  102  CO2 13* 23  --  24  GLUCOSE 129* 113*  --  135*  BUN 124* 128*  --  47*  CREATININE 8.14* 7.83*  --  3.92*  CALCIUM 9.0 8.5*  --  8.0*  MG  --  2.5*  --  1.9  PHOS  --   --  6.3*  --     Liver Function Tests: Recent Labs  Lab 08/24/19 1218 08/26/19 0426  AST 475* 162*  ALT 515* 322*  ALKPHOS 137* 107  BILITOT 3.2* 2.5*  PROT 6.3* 5.2*  ALBUMIN 3.3* 2.7*   No results  for input(s): LIPASE, AMYLASE in the last 168 hours. No results for input(s): AMMONIA in the last 168 hours.  CBC: Recent Labs  Lab 08/24/19 1218 08/25/19 0251 08/25/19 1455 08/26/19 0426  WBC 12.4* 8.3 10.9* 6.6  NEUTROABS 10.2*  --   --   --   HGB 10.1* 8.8* 9.2* 8.2*  HCT 32.1* 27.8* 28.7* 25.9*  MCV 103.2* 101.8* 101.1* 100.4*  PLT 51* 34* 35* 29*    Cardiac Enzymes: No results for input(s): CKTOTAL, CKMB, CKMBINDEX, TROPONINI in the last 168 hours.  BNP: Invalid input(s): POCBNP  CBG: Recent Labs  Lab 08/24/19 1806  GLUCAP 75    Microbiology: Results for orders placed or performed during the hospital encounter of 08/24/19  Aerobic/Anaerobic Culture (surgical/deep wound)     Status: None (Preliminary result)   Collection Time: 08/24/19 12:22 PM   Specimen: Wound  Result Value Ref Range Status   Specimen Description   Final    WOUND Performed at [facility], [address] [place], [place] [address]    Special Requests   Final    Normal Performed at [facility], [address] [place], [place] [address]    Gram Stain   Final    RARE WBC PRESENT, PREDOMINANTLY PMN RARE GRAM POSITIVE COCCI IN PAIRS Performed at [facility], [place] [address], [place], [place] [address]    Culture PENDING  Incomplete   Report Status PENDING  Incomplete  SARS Coronavirus 2 by RT PCR (hospital order, performed in [facility] hospital lab) Nasopharyngeal Nasopharyngeal Swab     Status: None   Collection Time: 08/24/19  1:37 PM   Specimen: Nasopharyngeal Swab  Result Value Ref Range Status   SARS Coronavirus 2 NEGATIVE NEGATIVE Final    Comment: (NOTE) If result is NEGATIVE SARS-CoV-2 target nucleic acids are NOT DETECTED. The SARS-CoV-2 RNA is generally detectable in upper and lower  respiratory specimens during the acute phase of infection. The lowest  concentration of SARS-CoV-2 viral copies this assay can detect is 250  copies / mL. A negative  result does not preclude SARS-CoV-2 infection  and should not be used as the sole basis for treatment or other  patient management decisions.  A negative result may occur with  improper specimen collection / handling, submission of specimen other  than nasopharyngeal swab, presence of viral mutation(s) within the  areas targeted by this assay, and inadequate number of viral copies  (<250 copies / mL). A negative result must be combined with clinical  observations, patient history, and epidemiological information. If result is POSITIVE SARS-CoV-2 target nucleic acids are DETECTED. The SARS-CoV-2 RNA is generally detectable in upper and lower  respiratory specimens dur ing the acute phase of infection.  Positive  results are indicative of active infection with SARS-CoV-2.  Clinical  correlation with patient history and other diagnostic information is  necessary to determine patient infection status.  Positive results do  not rule out bacterial infection or co-infection with other viruses. If result is PRESUMPTIVE POSTIVE SARS-CoV-2 nucleic acids MAY BE PRESENT.   A presumptive positive result was obtained on the submitted specimen  and confirmed on repeat testing.  While 2019 novel coronavirus  (SARS-CoV-2) nucleic acids may be present in the submitted sample  additional confirmatory testing may be necessary for epidemiological  and / or clinical management purposes  to differentiate between  SARS-CoV-2 and other Sarbecovirus currently known to infect humans.  If clinically indicated additional testing with an alternate test  methodology [phone]) is advised. The SARS-CoV-2 RNA is generally  detectable in upper and lower respiratory sp ecimens during the acute  phase of infection. The expected result is Negative. Fact Sheet for Patients:  StrictlyIdeas.no Fact Sheet for Healthcare Providers: BankingDealers.co.za This test is not yet  approved or cleared by the [place] FDA and has been authorized for detection and/or diagnosis of SARS-CoV-2 by FDA under an Emergency Use Authorization (EUA).  This EUA will remain in effect (meaning this test can be used) for the duration of the COVID-19 declaration under Section 564(b)(1) of the Act, 21 U.S.C. section 360bbb-3(b)(1), unless the authorization is terminated or revoked sooner. Performed at [facility], [address] [place], [place] [address]     Coagulation Studies: Recent Labs    08/25/19 0251 08/25/19 1455 08/26/19 0426  LABPROT 23.0* 22.1* 19.0*  INR 2.1* 2.0* 1.6*    Urinalysis: No results for input(s): COLORURINE, LABSPEC, PHURINE, GLUCOSEU, HGBUR, BILIRUBINUR, KETONESUR, PROTEINUR, UROBILINOGEN, NITRITE, LEUKOCYTESUR in the last 72 hours.  Invalid input(s): APPERANCEUR    Imaging: Dg Abd 1 View  Result Date: 08/25/2019 CLINICAL DATA:  Hemodialysis catheter malfunction. EXAM: ABDOMEN - 1 VIEW COMPARISON:  Plain film of the abdomen dated 08/06/2019. FINDINGS: Vascular stents are stable in position/configuration. There is a new RIGHT femoral catheter  with tip projected upwards to the level of the L1 vertebral body. Bowel gas pattern is nonobstructive. No evidence of soft tissue mass or abnormal fluid collection. No evidence of free intraperitoneal air seen. Surgical clips in the LEFT lower quadrant. IMPRESSION: 1. New RIGHT femoral catheter with tip projected upwards along the expected course of the IVC to the level of the L1 vertebral body. 2. Nonobstructive bowel gas pattern. Electronically Signed   By: Franki Cabot M.D.   On: 08/25/2019 14:55   US Venous Img Upper Uni Right  Result Date: 08/24/2019 CLINICAL DATA:  43 year old male with right upper extremity pain and swelling. Patient has a right upper extremity arteriovenous hemodialysis graft. EXAM: RIGHT UPPER EXTREMITY VENOUS DOPPLER ULTRASOUND TECHNIQUE: Gray-scale sonography with  graded compression, as well as color Doppler and duplex ultrasound were performed to evaluate the upper extremity deep venous system from the level of the subclavian vein and including the jugular, axillary, basilic, radial, ulnar and upper cephalic vein. Spectral Doppler was utilized to evaluate flow at rest and with distal augmentation maneuvers. COMPARISON:  None. FINDINGS: Contralateral Subclavian Vein: Respiratory phasicity is normal and symmetric with the symptomatic side. No evidence of thrombus. Normal compressibility. Internal Jugular Vein: No evidence of thrombus. Normal compressibility, respiratory phasicity and response to augmentation. Subclavian Vein: No evidence of thrombus. Normal compressibility, respiratory phasicity and response to augmentation. Axillary Vein: No evidence of thrombus. Normal compressibility, respiratory phasicity and response to augmentation. Cephalic Vein: No evidence of thrombus. Normal compressibility, respiratory phasicity and response to augmentation. Basilic Vein: No evidence of thrombus. Normal compressibility, respiratory phasicity and response to augmentation. Brachial Veins: No evidence of thrombus. Normal compressibility, respiratory phasicity and response to augmentation. Radial Veins: One of the paired radial veins is not compressible and demonstrates no evidence of flow on color Doppler imaging. Findings are consistent with thrombosis. Ulnar Veins: No evidence of thrombus. Normal compressibility, respiratory phasicity and response to augmentation. Venous Reflux:  None visualized. Other Findings: There is a large complex heterogeneous fluid collection in the region of the axilla surrounding the arteriovenous graft. The collection is difficult to measure given its amorphous nature. An estimated measurement is 4.2 x 1.5 by 6.5 cm IMPRESSION: 1. Positive for acute occlusive deep venous thrombosis in 1 of the paired radial veins in the forearm. 2. Large complex fluid  collection surrounding the arteriovenous graft in the axilla. Differential considerations include hematoma and abscess. 3. The arteriovenous graft remains patent. No evidence of internal thrombus. Signed, Criselda Peaches, MD, [facility] Electronically Signed   By: Jacqulynn Cadet M.D.   On: 08/24/2019 14:41   Dg Chest Port 1 View  Result Date: 08/25/2019 CLINICAL DATA:  Displacement of central venous catheter. EXAM: PORTABLE CHEST 1 VIEW COMPARISON:  08/12/2019 FINDINGS: The right-sided tunneled dialysis catheter is stable in positioning. Multiple stents are noted in the patient's left upper extremity. Heart size remains enlarged. There is vascular congestion with developing pulmonary edema. There is a moderate-sized left-sided pleural effusion with adjacent presumed atelectasis. IMPRESSION: 1. Stable positioning of the tunneled dialysis catheter. 2. Cardiomegaly with vascular congestion and possible early pulmonary edema. 3. Moderate-sized left-sided pleural effusion with adjacent compressive atelectasis. Electronically Signed   By: Constance Holster M.D.   On: 08/25/2019 05:45     Medications:   . ceFAZolin    . epinephrine    . norepinephrine (LEVOPHED) Adult infusion    . [MAR Hold] vancomycin     . [facility] Hold]  amLODipine  5 mg Oral Daily  . [MAR Hold] carvedilol  25 mg Oral BID WC  . [MAR Hold] Chlorhexidine Gluconate Cloth  6 each Topical Q0600  . [MAR Hold] Chlorhexidine Gluconate Cloth  6 each Topical Q0600  . [MAR Hold] feeding supplement (NEPRO CARB STEADY)  237 mL Oral BID BM  . [MAR Hold] hydrALAZINE  25 mg Oral Q8H  . [MAR Hold] levETIRAcetam  500 mg Oral BID  . [MAR Hold] multivitamin  1 tablet Oral QHS  . sodium chloride flush      . [MAR Hold] vitamin C  250 mg Oral BID   [MAR Hold] hydrALAZINE, [MAR Hold]  HYDROmorphone (DILAUDID) injection, [MAR Hold] ondansetron **OR** [MAR Hold] ondansetron (ZOFRAN)  IV  Assessment/ Plan:  43 y.o. male with end-stage renal disease on hemodialysis, hypertension, peripheral vascular disease, depression, PEA event 10/20.  [place] TTSLeft AVG  1.  ESRD on HD TTS.  Patient has missed last 2 dialysis sessions as an outpatient.  -Patient did undergo hemodialysis yesterday and tolerated well. No urgent indication for dialysis today. We will plan for dialysis again tomorrow.  2.  Hyperkalemia. Now corrected and potassium down to 3.4. Continue to monitor serum potassium.  3.  Anemia chronic kidney disease. Continue to monitor hemoglobin. Maintain the patient on Epogen with dialysis treatments.  4.  Secondary hyperparathyroidism. Most recent serum phosphorus was 6.3. We plan to repeat this tomorrow.  5.  Wound dehiscence at left upper extremity AV access site.  Vascular surgery closely following and plans to take the patient to the OR today.   LOS: 2 Yash Cacciola 11/4/202010:38 AM

## 2019-08-26 NOTE — Progress Notes (Signed)
PROGRESS NOTE    Brett Small  [id number] DOB: May 16, 1976 DOA: 08/24/2019 PCP: Patient, [name]      Brief Narrative:  Brett Small is a 43 y.o. M with ESRD, HTN, PVD s/p aortofem bypass, depression and recent PEA arrest who presented with 1 week draining from right arm wound and missed HD.     In the ER, K 6.1, WBC 12K.  Vascular surgery consulted and noted plan for wound repair on hospital day 2.     Assessment & Plan:  Wound dehiscence S/p excision of RIGHT arm AV graft and brachial artery repair with patch angioplasty -Consult Vascular surgery, appreciate cares -Post-op care per [facility]   Thrombocytopenia New. Recurrent.  No clinical symptoms to suggest TTP and smear with fragments, no schistocytes.  Otherwise smear normal, counts near baseline, leukemia considered less likely.    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 -Consult Hematology -Hold Eliquis, aspirin  Transaminitis Same as July.  AST/ALT 500s, not repeated since.    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  INR 1.6 today.  US liver 2 weeks PTA showed normal sonogrpahic appearance of liver, small ascites. -Repeat LFTs -Check albumin -STOP acetaminophen   ESRD -Consult Nephrology for maintenance HD, appreciate cares  Hypertension BP elevated  -Continue carvedilol, hydralazine -Start amlodipine -Hold Lipitor   Depression  Moderate protein calorie malnutrition  Smoking  Chronic systolic CHF EF [id number] in Oct.  Appears euvolemic.   -Fluid status managmement with HD  Smoking, tobacco, marijuana  Questionable seizure disorder Diagnosed during previous hospitalization.   No seizure like activity here -Continue Keppra  Anemia of chronic renal disease           MDM and disposition: The below labs and imaging reports were  reviewed and summarized above.  Medication management as above.  The patient was admitted with wound dehiscence and expose AV graft.  Went to OR today.    He has an incidental thrombocytopenia and hepatitis.  This places him at risk for post-op complications, and so we will observe him overnight for his artery repair, monitor platelets and begin work up for transaminitis and thrombocytopenia.       DVT prophylaxis: SCDs Code Status: FULL Family Communication:     Consultants:   Vascular surgery  Hematology  Procedures:   11/4 excision of graft, and artery repair  Antimicrobials:   Vancomycin x1    Subjective: RIght arm hurts post op.  No abdominal pain, vomiting, nausea.  No confusion, bleeding.  Came in for "blood clots" he thinks.  Objective: Vitals:   08/26/19 1320 08/26/19 1325 08/26/19 1348 08/26/19 1411  BP:   (!) 139/91 (!) 157/102  Pulse: 84 84 86 86  Resp: 12 12 14 14   Temp:   (!) 97 F (36.1 C) 97.7 F (36.5 C)  TempSrc:    Oral  SpO2: 95% 94% 94% 100%  Weight:      Height:        Intake/Output Summary (Last 24 hours) at 08/26/2019 1456 Last data filed at 08/26/2019 1451 Gross per 24 hour  Intake 1023 ml  Output 2285 ml  Net -1262 ml   Filed Weights   08/24/19 1153 08/24/19 1807 08/26/19 0921  Weight: 47.6 kg 48.2 kg 47.6 kg    Examination: General appearance: thin adult male, alert and in no acute distress.  HEENT: mild icteric, conjunctiva pink, lids and lashes normal. No nasal deformity, discharge, epistaxis.  Lips moist, dentition poor, OP dry, no oral lesions, hearing normal.   Skin: Warm and dry.  No jaundice.  No suspicious rashes or lesions. Cardiac: RRR, nl [id number], systolic murmur noted.  Capillary refill is brisk.  JVP normal.  No LE edema.  Radia  pulses 2+ and symmetric. Respiratory: Normal respiratory rate and rhythm.  CTAB without rales or wheezes. Abdomen: Abdomen soft.  No TTP or gaurding. No ascites, distension,  hepatosplenomegaly.   MSK: No deformities or effusions. Neuro: Awake but sleepy, oriented to self, year, place.  EOMI, moves all extremities with global weakness. Speech fluent.    Psych: Sensorium intact and responding to questions, attention diminnshed. Affect blunted by anesthesia.  Judgment and insight appear normal.    Data Reviewed: I have personally reviewed following labs and imaging studies:  CBC: Recent Labs  Lab 08/24/19 1218 08/25/19 0251 08/25/19 1455 08/26/19 0426  WBC 12.4* 8.3 10.9* 6.6  NEUTROABS 10.2*  --   --   --   HGB 10.1* 8.8* 9.2* 8.2*  HCT 32.1* 27.8* 28.7* 25.9*  MCV 103.2* 101.8* 101.1* 100.4*  PLT 51* 34* 35* 29*   Basic Metabolic Panel: Recent Labs  Lab 08/24/19 1218 08/25/19 0251 08/25/19 1155 08/26/19 0426  NA 133* 139  --  138  K 6.1* 5.4*  --  3.4*  CL 93* 98  --  102  CO2 13* 23  --  24  GLUCOSE 129* 113*  --  135*  BUN 124* 128*  --  47*  CREATININE 8.14* 7.83*  --  3.92*  CALCIUM 9.0 8.5*  --  8.0*  MG  --  2.5*  --  1.9  PHOS  --   --  6.3*  --    GFR: Estimated Creatinine Clearance: 16 mL/min (A) (by C-G formula based on SCr of 3.92 mg/dL (H)). Liver Function Tests: Recent Labs  Lab 08/24/19 1218 08/26/19 0426  AST 475* 162*  ALT 515* 322*  ALKPHOS 137* 107  BILITOT 3.2* 2.5*  PROT 6.3* 5.2*  ALBUMIN 3.3* 2.7*   No results for input(s): LIPASE, AMYLASE in the last 168 hours. No results for input(s): AMMONIA in the last 168 hours. Coagulation Profile: Recent Labs  Lab 08/25/19 0251 08/25/19 1455 08/26/19 0426  INR 2.1* 2.0* 1.6*   Cardiac Enzymes: No results for input(s): CKTOTAL, CKMB, CKMBINDEX, TROPONINI in the last 168 hours. BNP (last 3 results) No results for input(s): PROBNP in the last 8760 hours. HbA1C: No results for input(s): HGBA1C in the last 72 hours. CBG: Recent Labs  Lab 08/24/19 1806  GLUCAP 80   Lipid Profile: No results for input(s): CHOL, HDL, LDLCALC, TRIG, CHOLHDL, LDLDIRECT in the  last 72 hours. Thyroid Function Tests: No results for input(s): TSH, T4TOTAL, FREET4, T3FREE, THYROIDAB in the last 72 hours. Anemia Panel: Recent Labs    08/26/19 0426 08/26/19 0856  VITAMINB12 7,110*  --   FOLATE  --  14.9  RETICCTPCT 6.0*  --    Urine analysis:    Component Value Date/Time   COLORURINE AMBER (A) 08/05/2019 2049   APPEARANCEUR CLOUDY (A) 08/05/2019 2049   LABSPEC 1.023 08/05/2019 2049   PHURINE 6.0 08/05/2019 2049   GLUCOSEU NEGATIVE 08/05/2019 2049   HGBUR MODERATE (A) 08/05/2019 2049   [place] NEGATIVE 08/05/2019 2049   [place] NEGATIVE 08/05/2019 2049   PROTEINUR 100 (A) 08/05/2019 2049   NITRITE NEGATIVE 08/05/2019 2049  LEUKOCYTESUR SMALL (A) 08/05/2019 2049   Sepsis Labs: @LABRCNTIP (procalcitonin:4,lacticacidven:4)  ) Recent Results (from the past 240 hour(s))  Aerobic/Anaerobic Culture (surgical/deep wound)     Status: None (Preliminary result)   Collection Time: 08/24/19 12:22 PM   Specimen: Wound  Result Value Ref Range Status   Specimen Description   Final    WOUND Performed at [facility], [address] [place], [place] [address]    Special Requests   Final    Normal Performed at [facility], [address] [place], [place] [address]    Gram Stain   Final    RARE WBC PRESENT, PREDOMINANTLY PMN RARE GRAM POSITIVE COCCI IN PAIRS Performed at [facility], [place] [address] [place], [place] [address]    Culture   Final    FEW STENOTROPHOMONAS MALTOPHILIA FEW ACINETOBACTER BAUMANNII NO ANAEROBES ISOLATED; CULTURE IN PROGRESS FOR 5 DAYS    Report Status PENDING  Incomplete  SARS Coronavirus 2 by RT PCR (hospital order, performed in [place] hospital lab) Nasopharyngeal Nasopharyngeal Swab     Status: None   Collection Time: 08/24/19  1:37 PM   Specimen: Nasopharyngeal Swab  Result Value Ref Range Status   SARS Coronavirus 2 NEGATIVE NEGATIVE Final    Comment: (NOTE) If result is NEGATIVE  SARS-CoV-2 target nucleic acids are NOT DETECTED. The SARS-CoV-2 RNA is generally detectable in upper and lower  respiratory specimens during the acute phase of infection. The lowest  concentration of SARS-CoV-2 viral copies this assay can detect is 250  copies / mL. A negative result does not preclude SARS-CoV-2 infection  and should not be used as the sole basis for treatment or other  patient management decisions.  A negative result may occur with  improper specimen collection / handling, submission of specimen other  than nasopharyngeal swab, presence of viral mutation(s) within the  areas targeted by this assay, and inadequate number of viral copies  (<250 copies / mL). A negative result must be combined with clinical  observations, patient history, and epidemiological information. If result is POSITIVE SARS-CoV-2 target nucleic acids are DETECTED. The SARS-CoV-2 RNA is generally detectable in upper and lower  respiratory specimens dur ing the acute phase of infection.  Positive  results are indicative of active infection with SARS-CoV-2.  Clinical  correlation with patient history and other diagnostic information is  necessary to determine patient infection status.  Positive results do  not rule out bacterial infection or co-infection with other viruses. If result is PRESUMPTIVE POSTIVE SARS-CoV-2 nucleic acids MAY BE PRESENT.   A presumptive positive result was obtained on the submitted specimen  and confirmed on repeat testing.  While 2019 novel coronavirus  (SARS-CoV-2) nucleic acids may be present in the submitted sample  additional confirmatory testing may be necessary for epidemiological  and / or clinical management purposes  to differentiate between  SARS-CoV-2 and other Sarbecovirus currently known to infect humans.  If clinically indicated additional testing with an alternate test  methodology [phone]) is advised. The SARS-CoV-2 RNA is generally  detectable in upper  and lower respiratory sp ecimens during the acute  phase of infection. The expected result is Negative. Fact Sheet for Patients:  StrictlyIdeas.no Fact Sheet for Healthcare Providers: BankingDealers.co.za This test is not yet approved or cleared by the [place] FDA and has been authorized for detection and/or diagnosis of SARS-CoV-2 by FDA under an Emergency Use Authorization (EUA).  This EUA will remain in effect (meaning this test can  be used) for the duration of the COVID-19 declaration under Section 564(b)(1) of the Act, 21 U.S.C. section 360bbb-3(b)(1), unless the authorization is terminated or revoked sooner. Performed at [facility], [address], [place],  [address]          Radiology Studies: Dg Abd 1 View  Result Date: 08/25/2019 CLINICAL DATA:  Hemodialysis catheter malfunction. EXAM: ABDOMEN - 1 VIEW COMPARISON:  Plain film of the abdomen dated 08/06/2019. FINDINGS: Vascular stents are stable in position/configuration. There is a new RIGHT femoral catheter with tip projected upwards to the level of the L1 vertebral body. Bowel gas pattern is nonobstructive. No evidence of soft tissue mass or abnormal fluid collection. No evidence of free intraperitoneal air seen. Surgical clips in the LEFT lower quadrant. IMPRESSION: 1. New RIGHT femoral catheter with tip projected upwards along the expected course of the IVC to the level of the L1 vertebral body. 2. Nonobstructive bowel gas pattern. Electronically Signed   By: Franki Cabot M.D.   On: 08/25/2019 14:55   Dg Chest Port 1 View  Result Date: 08/25/2019 CLINICAL DATA:  Displacement of central venous catheter. EXAM: PORTABLE CHEST 1 VIEW COMPARISON:  08/12/2019 FINDINGS: The right-sided tunneled dialysis catheter is stable in positioning. Multiple stents are noted in the patient's left upper extremity. Heart size remains enlarged. There is vascular congestion  with developing pulmonary edema. There is a moderate-sized left-sided pleural effusion with adjacent presumed atelectasis. IMPRESSION: 1. Stable positioning of the tunneled dialysis catheter. 2. Cardiomegaly with vascular congestion and possible early pulmonary edema. 3. Moderate-sized left-sided pleural effusion with adjacent compressive atelectasis. Electronically Signed   By: Constance Holster M.D.   On: 08/25/2019 05:45        Scheduled Meds: . amLODipine  5 mg Oral Daily  . carvedilol  25 mg Oral BID WC  . Chlorhexidine Gluconate Cloth  6 each Topical Q0600  . Chlorhexidine Gluconate Cloth  6 each Topical Q0600  . feeding supplement (NEPRO CARB STEADY)  237 mL Oral BID BM  . hydrALAZINE  25 mg Oral Q8H  . levETIRAcetam  500 mg Oral BID  . multivitamin  1 tablet Oral QHS  . sodium chloride flush      . vitamin C  250 mg Oral BID   Continuous Infusions:    LOS: 2 days    Time spent: 25 minutes    Edwin Dada, MD [facility] 08/26/2019, 2:56 PM     Please page through [place]:  [email or web address] Password [email or web address] If 7PM-7AM, please contact night-coverage

## 2019-08-26 NOTE — Anesthesia Preprocedure Evaluation (Deleted)
Anesthesia Evaluation  Patient identified by MRN, date of birth, ID band Patient awake    Reviewed: Allergy & Precautions, H&P , NPO status , Patient's Chart, lab work & pertinent test results  Airway        Dental   Pulmonary Current Smoker,           Cardiovascular hypertension, + Past MI, + Peripheral Vascular Disease and +CHF    Echo October 2020  -severe LV failure, moderate RV failure, moderate elevated PA pressures  1. Left ventricular ejection fraction, by visual estimation, is <20%. The left ventricle has severely decreased function. Left ventricular septal wall thickness was normal. There is no left ventricular hypertrophy.  2. Dilated cardiomyopathy.  3. Global right ventricle has moderately reduced systolic function.The right ventricular size is mildly enlarged. No increase in right ventricular wall thickness.  4. Left atrial size was mild-moderately dilated.  5. Right atrial size was mild-moderately dilated.  6. The mitral valve is grossly normal. Moderate to severe mitral valve regurgitation.  7. The tricuspid valve is grossly normal. Tricuspid valve regurgitation mild-moderate.  8. The aortic valve is grossly normal Aortic valve regurgitation was not visualized by color flow Doppler. Mild aortic valve sclerosis without stenosis.  9. Mild pulmonic stenosis. 10. The pulmonic valve was grossly normal. Pulmonic valve regurgitation is mild to moderate by color flow Doppler. 11. The aortic root was not well visualized. 12. Moderately elevated pulmonary artery systolic pressure.   Neuro/Psych Seizures -,  PSYCHIATRIC DISORDERS Depression    GI/Hepatic negative GI ROS, (+) Hepatitis -  Endo/Other  negative endocrine ROS  Renal/GU ESRF and DialysisRenal disease     Musculoskeletal   Abdominal   Peds  Hematology  (+) Blood dyscrasia, anemia , Anemic Hgb 8.2 Thrombocytopenic platelets 29 INR elevated at 1.6    Anesthesia Other Findings Past Medical History: No date: ESRD (end stage renal disease) ([place]) No date: Hypertension No date: PAD (peripheral artery disease) ([facility])     Comment:  Required aortofemoral stent-which had closed and had to               redo the procedure and ischemia of limb. No date: Peripheral vascular disease ([place]) No date: Renal disorder No date: Secondary hyperparathyroidism of renal origin [facility])  Past Surgical History: 01/19/2019: A/V SHUNT INTERVENTION; Left     Comment:  Procedure: LEFT UPPER EXTREMITY A/V SHUNTOGRAM / UPPER               EXTREMITY ANGIOGRAM;  Surgeon: Algernon Huxley, MD;                Location: [place] [facility];  Service: Cardiovascular;              Laterality: Left; 03/02/2019: A/V SHUNT INTERVENTION; Left     Comment:  Procedure: A/V SHUNT INTERVENTION;  Surgeon: Algernon Huxley, MD;  Location: [place] [facility];  Service:               Cardiovascular;  Laterality: Left; No date: AORTA - FEMORAL ARTERY BYPASS GRAFT 12/26/2018: AV FISTULA PLACEMENT; Left     Comment:  Procedure: INSERTION OF GORE STRETCH VASCULAR 4-7MM X                45CM IN LEFT UPPER ARM;  Surgeon: Marty Heck,               MD;  Location: [facility] OR;  Service: Vascular;  Laterality:               Left; 08/12/2019: AV FISTULA PLACEMENT; Right     Comment:  Procedure: INSERTION OF ARTERIOVENOUS (AV) GORE-TEX               GRAFT ARM;  Surgeon: Algernon Huxley, MD;  Location: [facility]               ORS;  Service: Vascular;  Laterality: Right; 02/06/2019: DIALYSIS/PERMA CATHETER REMOVAL; N/A     Comment:  Procedure: DIALYSIS/PERMA CATHETER REMOVAL;  Surgeon:               Algernon Huxley, MD;  Location: [place] [facility];                Service: Cardiovascular;  Laterality: N/A; 07/10/2019: LEFT HEART CATH AND CORONARY ANGIOGRAPHY; Right     Comment:  Procedure: LEFT HEART CATH AND CORONARY ANGIOGRAPHY;                Surgeon: Dionisio David, MD;  Location: [facility]              [facility];  Service: Cardiovascular;  Laterality: Right; 07/28/2019: PERIPHERAL VASCULAR THROMBECTOMY; Left     Comment:  Procedure: Left Upper Extremity Dialysis Access Decl[place];               Surgeon: Katha Cabal, MD;  Location: [place]              [facility];  Service: Cardiovascular;  Laterality: Left; 08/25/2019: TEMPORARY DIALYSIS CATHETER; N/A     Comment:  Procedure: TEMPORARY DIALYSIS CATHETER;  Surgeon:               Katha Cabal, MD;  Location: [place];               Service: Cardiovascular;  Laterality: N/A; 08/13/2019: UPPER EXTREMITY ANGIOGRAPHY; Left     Comment:  Procedure: UPPER EXTREMITY ANGIOGRAPHy;  Surgeon: Algernon Huxley, MD;  Location: [place] [facility];  Service:               Cardiovascular;  Laterality: Left;  BMI    Body Mass Index: 19.84 kg/m      Reproductive/Obstetrics negative OB ROS                            Anesthesia Physical Anesthesia Plan  ASA: IV  Anesthesia Plan: General ETT   Post-op Pain Management:    Induction:   PONV Risk Score and Plan: Ondansetron, Dexamethasone, Midazolam and Treatment may vary due to age or medical condition  Airway Management Planned:   Additional Equipment:   Intra-op Plan:   Post-operative Plan:   Informed Consent: I have reviewed the patients History and Physical, chart, labs and discussed the procedure including the risks, benefits and alternatives for the proposed anesthesia with the patient or authorized representative who has indicated his/her understanding and acceptance.     Dental Advisory Given  Plan Discussed with: Anesthesiologist, CRNA and Surgeon  Anesthesia Plan Comments: (Very poor candidate for GA, high risk of CV complications Per surgeon GA is needed Plan GETA Blood products available)        Anesthesia Quick Evaluation

## 2019-08-26 NOTE — Op Note (Signed)
[facility]   OPERATIVE NOTE  DATE: 08/26/2019  PRE-OPERATIVE DIAGNOSIS: ESRD, exposed right brachial artery to axillary vein AV graft status post wound dehiscence x2  POST-OPERATIVE DIAGNOSIS: same as above  PROCEDURE: 1.   Excision of entire right brachial artery to axillary vein AV graft 2.   Repair of right brachial artery with CorMatrix patch angioplasty  SURGEON: Leotis Pain  ASSISTANT(S): Hezzie Bump, PA-C  ANESTHESIA: general  ESTIMATED BLOOD LOSS: 50 cc  FINDING(S): 1.  Patent graft which was not particularly well incorporated but had no purulent material.   INDICATIONS:   Brett Small is a 43 y.o. male who presents with a right brachial artery to axillary vein AV graft placed last month.  He has now had a second antecubital wound dehiscence with exposed graft and at this point the graft has to be assumed to be infected and will have to be removed.  He will need to continue catheter-based dialysis for the foreseeable future as he is clearly not adequately healing.  Risks and benefits were discussed and informed consent was obtained. An assistant was present during the procedure to help facilitate the exposure and expedite the procedure.  DESCRIPTION: After obtaining full informed written consent, the patient was brought back to the operating room and placed supine upon the operating table.  The patient was prepped and draped in the standard fashion. The assistant provided retraction and mobilization to help facilitate exposure and expedite the procedure throughout the entire procedure.  This included following suture, using retractors, and optimizing lighting.  The existing antecubital incision was opened was extended somewhat medially to allow control of the artery and vein proximally and distally to the graft.  He was somewhat oozy and got platelet transfusion during the procedure due to a platelet count of roughly 30,000. We then turned our  attention to the axillary incision which was reopened.  Graft was identified at this location and bulldog clamps were used to control the vein proximal and distal to the graft.  No heparin was given due to the patient's thrombocytopenia. I then controlled the vein with bulldog clamps and remove the graft in its entirety from the vein.  The vein was small and shredded and was not really reconstructable at this point so it was ligated proximally and distally with 2-0 silk ties. I then flushed through the graft to try to avoid any clot and then controlled the brachial artery proximally and distally with clamps.  The graft was then removed from the artery in its entirety to make sure no foreign body was remaining.  The artery was small and not really a candidate for a primary closure.  The decision was made to use a core matrix patch to reconstruct the brachial artery to maintain patency.  A small diamond-shaped patch was cut and beveled to match the arteriotomy.  A 6-0 Prolene was started at the distal endpoint and a second 6-0 Prolene was started at the proximal endpoint.  The suture lines were then run roughly 1/2 the length of the arteriotomy and tied together flushing and de-airing the vessel prior to release of control.  6-0 Prolene patch sutures were used for hemostasis on release of control and hemostasis was complete after several minutes of pressure.  The wound was then irrigated and both the antecubital incision and the axillary incision were closed with interrupted 3-0 Vicryl sutures in the deep tissue.  Interrupted sutures were done to avoid loss of that layer with any issue  with 1 suture.  The skin was then closed with a series of interrupted 3-0 nylon mattress sutures and staples.  Sterile dressing and an Ace wrap were placed and the patient was placed in an arm sling to try to keep the antecubital area somewhat flexed. At this point, we elected to complete the procedure.  The patient was taken to the  recovery room in stable condition.   COMPLICATIONS: None  CONDITION: Stable  Leotis Pain  08/26/2019, 12:27 PM    This note was created with Dragon Medical transcription system. Any errors in dictation are purely unintentional.

## 2019-08-26 NOTE — H&P (Signed)
[facility] History & Physical Update  The patient was interviewed and re-examined.  The patient's previous History and Physical has been reviewed and is unchanged.  There is no change in the plan of care. We plan to proceed with the scheduled procedure.  Leotis Pain, MD  08/26/2019, 9:46 AM

## 2019-08-26 NOTE — Anesthesia Preprocedure Evaluation (Addendum)
Anesthesia Evaluation  Patient identified by MRN, date of birth, ID band Patient awake    Reviewed: Allergy & Precautions, H&P , NPO status , Patient's Chart, lab work & pertinent test results  Airway Mallampati: II  TM Distance: >3 FB     Dental  (+) Edentulous Lower, Edentulous Upper   Pulmonary neg pulmonary ROS, Current Smoker,           Cardiovascular hypertension, + Past MI, + Peripheral Vascular Disease and +CHF   Rhythm:regular Rate:Tachycardia  LV failure, EF <20% RV failure Pulm hypertension PEA arrest 2 weeks ago  Echo Oct 2020 1. Left ventricular ejection fraction, by visual estimation, is <20%. The left ventricle has severely decreased function. Left ventricular septal wall thickness was normal. There is no left ventricular hypertrophy.  2. Dilated cardiomyopathy.  3. Global right ventricle has moderately reduced systolic function.The right ventricular size is mildly enlarged. No increase in right ventricular wall thickness.  4. Left atrial size was mild-moderately dilated.  5. Right atrial size was mild-moderately dilated.  6. The mitral valve is grossly normal. Moderate to severe mitral valve regurgitation.  7. The tricuspid valve is grossly normal. Tricuspid valve regurgitation mild-moderate.  8. The aortic valve is grossly normal Aortic valve regurgitation was not visualized by color flow Doppler. Mild aortic valve sclerosis without stenosis.  9. Mild pulmonic stenosis. 10. The pulmonic valve was grossly normal. Pulmonic valve regurgitation is mild to moderate by color flow Doppler. 11. The aortic root was not well visualized. 12. Moderately elevated pulmonary artery systolic pressure   Neuro/Psych Seizures -,  PSYCHIATRIC DISORDERS Depression    GI/Hepatic negative GI ROS, (+) Hepatitis -  Endo/Other  negative endocrine ROS  Renal/GU ESRF and DialysisRenal disease     Musculoskeletal   Abdominal    Peds  Hematology  (+) Blood dyscrasia, anemia , Hgb 8.2 Platelets 35 INR 1.6   Anesthesia Other Findings Past Medical History: No date: ESRD (end stage renal disease) (HCC) No date: Hypertension No date: PAD (peripheral artery disease) (HCC)     Comment:  Required aortofemoral stent-which had closed and had to               redo the procedure and ischemia of limb. No date: Peripheral vascular disease ([place]) No date: Renal disorder No date: Secondary hyperparathyroidism of renal origin [facility])  Past Surgical History: 01/19/2019: A/V SHUNT INTERVENTION; Left     Comment:  Procedure: LEFT UPPER EXTREMITY A/V SHUNTOGRAM / UPPER               EXTREMITY ANGIOGRAM;  Surgeon: Algernon Huxley, MD;                Location: [place] [facility];  Service: Cardiovascular;              Laterality: Left; 03/02/2019: A/V SHUNT INTERVENTION; Left     Comment:  Procedure: A/V SHUNT INTERVENTION;  Surgeon: Algernon Huxley, MD;  Location: [place] [facility];  Service:               Cardiovascular;  Laterality: Left; No date: AORTA - FEMORAL ARTERY BYPASS GRAFT 12/26/2018: AV FISTULA PLACEMENT; Left     Comment:  Procedure: INSERTION OF GORE STRETCH VASCULAR 4-7MM X                45CM IN LEFT UPPER ARM;  Surgeon: Marty Heck,  MD;  Location: [facility];  Service: Vascular;  Laterality:               Left; 08/12/2019: AV FISTULA PLACEMENT; Right     Comment:  Procedure: INSERTION OF ARTERIOVENOUS (AV) GORE-TEX               GRAFT ARM;  Surgeon: Algernon Huxley, MD;  Location: [facility]               ORS;  Service: Vascular;  Laterality: Right; 02/06/2019: DIALYSIS/PERMA CATHETER REMOVAL; N/A     Comment:  Procedure: DIALYSIS/PERMA CATHETER REMOVAL;  Surgeon:               Algernon Huxley, MD;  Location: [place] [facility];                Service: Cardiovascular;  Laterality: N/A; 07/10/2019: LEFT HEART CATH AND CORONARY ANGIOGRAPHY; Right     Comment:  Procedure: LEFT HEART CATH AND  CORONARY ANGIOGRAPHY;                Surgeon: Dionisio David, MD;  Location: [place] [facility];  Service: Cardiovascular;  Laterality: Right; 07/28/2019: PERIPHERAL VASCULAR THROMBECTOMY; Left     Comment:  Procedure: Left Upper Extremity Dialysis Access Decl[place];               Surgeon: Katha Cabal, MD;  Location: [facility];  Service: Cardiovascular;  Laterality: Left; 08/25/2019: TEMPORARY DIALYSIS CATHETER; N/A     Comment:  Procedure: TEMPORARY DIALYSIS CATHETER;  Surgeon:               Katha Cabal, MD;  Location: [facility];               Service: Cardiovascular;  Laterality: N/A; 08/13/2019: UPPER EXTREMITY ANGIOGRAPHY; Left     Comment:  Procedure: UPPER EXTREMITY ANGIOGRAPHy;  Surgeon: Algernon Huxley, MD;  Location: [place] [facility];  Service:               Cardiovascular;  Laterality: Left;  BMI    Body Mass Index: 19.84 kg/m      Reproductive/Obstetrics negative OB ROS                            Anesthesia Physical Anesthesia Plan  ASA: IV  Anesthesia Plan: General ETT   Post-op Pain Management:    Induction:   PONV Risk Score and Plan: Ondansetron, Dexamethasone and Treatment may vary due to age or medical condition  Airway Management Planned:   Additional Equipment: Arterial line  Intra-op Plan:   Post-operative Plan:   Informed Consent: I have reviewed the patients History and Physical, chart, labs and discussed the procedure including the risks, benefits and alternatives for the proposed anesthesia with the patient or authorized representative who has indicated his/her understanding and acceptance.     Dental Advisory Given  Plan Discussed with: Anesthesiologist, CRNA and Surgeon  Anesthesia Plan Comments: (Very poor candidate for GA, per surgeon requires GA for infected graft Platelets 35, Hb 8.2, INR 1.6 Blood products in room Pre-induction  arterial line Etomidate for induction Inotropes, pressors available Discussed possibility of prolonged post op  intubation)        Anesthesia Quick Evaluation

## 2019-08-26 NOTE — Transfer of Care (Signed)
Immediate Anesthesia Transfer of Care Note  Patient: Brett Small  Procedure(s) Performed: REMOVAL OF A DIALYSIS CATHETER (Right )  Patient Location: PACU  Anesthesia Type:General  Level of Consciousness: sedated  Airway & Oxygen Therapy: Patient Spontanous Breathing and Patient connected to face mask oxygen  Post-op Assessment: Report given to RN and Post -op Vital signs reviewed and stable  Post vital signs: Reviewed and stable  Last Vitals:  Vitals Value Taken Time  BP 151/100 08/26/19 1251  Temp    Pulse 106 08/26/19 1251  Resp 29 08/26/19 1251  SpO2 100 % 08/26/19 1251    Last Pain:  Vitals:   08/26/19 0921  TempSrc: Temporal  PainSc: 8          Complications: No apparent anesthesia complications

## 2019-08-27 ENCOUNTER — Other Ambulatory Visit: Payer: Self-pay | Admitting: Oncology

## 2019-08-27 ENCOUNTER — Encounter: Payer: Self-pay | Admitting: Vascular Surgery

## 2019-08-27 DIAGNOSIS — Z885 Allergy status to narcotic agent status: Secondary | ICD-10-CM

## 2019-08-27 DIAGNOSIS — Z8674 Personal history of sudden cardiac arrest: Secondary | ICD-10-CM

## 2019-08-27 DIAGNOSIS — Z881 Allergy status to other antibiotic agents status: Secondary | ICD-10-CM

## 2019-08-27 DIAGNOSIS — F17210 Nicotine dependence, cigarettes, uncomplicated: Secondary | ICD-10-CM

## 2019-08-27 DIAGNOSIS — I509 Heart failure, unspecified: Secondary | ICD-10-CM

## 2019-08-27 DIAGNOSIS — F329 Major depressive disorder, single episode, unspecified: Secondary | ICD-10-CM

## 2019-08-27 DIAGNOSIS — Z9119 Patient's noncompliance with other medical treatment and regimen: Secondary | ICD-10-CM

## 2019-08-27 DIAGNOSIS — T8130XA Disruption of wound, unspecified, initial encounter: Secondary | ICD-10-CM

## 2019-08-27 DIAGNOSIS — N186 End stage renal disease: Secondary | ICD-10-CM

## 2019-08-27 DIAGNOSIS — I739 Peripheral vascular disease, unspecified: Secondary | ICD-10-CM

## 2019-08-27 DIAGNOSIS — T8131XA Disruption of external operation (surgical) wound, not elsewhere classified, initial encounter: Secondary | ICD-10-CM

## 2019-08-27 DIAGNOSIS — Z95828 Presence of other vascular implants and grafts: Secondary | ICD-10-CM

## 2019-08-27 DIAGNOSIS — D631 Anemia in chronic kidney disease: Secondary | ICD-10-CM

## 2019-08-27 DIAGNOSIS — R7401 Elevation of levels of liver transaminase levels: Secondary | ICD-10-CM

## 2019-08-27 DIAGNOSIS — D696 Thrombocytopenia, unspecified: Secondary | ICD-10-CM

## 2019-08-27 DIAGNOSIS — Z9862 Peripheral vascular angioplasty status: Secondary | ICD-10-CM

## 2019-08-27 DIAGNOSIS — Z992 Dependence on renal dialysis: Secondary | ICD-10-CM

## 2019-08-27 DIAGNOSIS — T82898A Other specified complication of vascular prosthetic devices, implants and grafts, initial encounter: Secondary | ICD-10-CM

## 2019-08-27 DIAGNOSIS — Z89422 Acquired absence of other left toe(s): Secondary | ICD-10-CM

## 2019-08-27 DIAGNOSIS — R7402 Elevation of levels of lactic acid dehydrogenase (LDH): Secondary | ICD-10-CM

## 2019-08-27 LAB — BASIC METABOLIC PANEL
Anion gap: 14 (ref 5–15)
BUN: 56 mg/dL — ABNORMAL HIGH (ref 6–20)
CO2: 24 mmol/L (ref 22–32)
Calcium: 8.4 mg/dL — ABNORMAL LOW (ref 8.9–10.3)
Chloride: 100 mmol/L (ref 98–111)
Creatinine, Ser: 4.62 mg/dL — ABNORMAL HIGH (ref 0.61–1.24)
GFR calc Af Amer: 16 mL/min — ABNORMAL LOW (ref >60)
GFR calc non Af Amer: 14 mL/min — ABNORMAL LOW (ref >60)
Glucose, Bld: 169 mg/dL — ABNORMAL HIGH (ref 70–99)
Potassium: 4.1 mmol/L (ref 3.5–5.1)
Sodium: 138 mmol/L (ref 135–145)

## 2019-08-27 LAB — BPAM PLATELET PHERESIS
Blood Product Expiration Date: 202011062359
Blood Product Expiration Date: 202011062359
ISSUE DATE / TIME: 202011041012
ISSUE DATE / TIME: 202011041012
Unit Type and Rh: 5100
Unit Type and Rh: 6200

## 2019-08-27 LAB — PREPARE FRESH FROZEN PLASMA
Unit division: 0
Unit division: 0
Unit division: 0

## 2019-08-27 LAB — BPAM FFP
Blood Product Expiration Date: 202011082359
Blood Product Expiration Date: 202011082359
Blood Product Expiration Date: 202011082359
Blood Product Expiration Date: 202011082359
Unit Type and Rh: 8400
Unit Type and Rh: 8400
Unit Type and Rh: 8400
Unit Type and Rh: 8400

## 2019-08-27 LAB — PREPARE PLATELET PHERESIS
Unit division: 0
Unit division: 0

## 2019-08-27 LAB — HIV ANTIBODY (ROUTINE TESTING W REFLEX): HIV Screen 4th Generation wRfx: NONREACTIVE

## 2019-08-27 LAB — MAGNESIUM: Magnesium: 2 mg/dL (ref 1.7–2.4)

## 2019-08-27 LAB — HEPATIC FUNCTION PANEL
ALT: 168 U/L — ABNORMAL HIGH (ref 0–44)
AST: 79 U/L — ABNORMAL HIGH (ref 15–41)
Albumin: 2.7 g/dL — ABNORMAL LOW (ref 3.5–5.0)
Alkaline Phosphatase: 106 U/L (ref 38–126)
Bilirubin, Direct: 0.7 mg/dL — ABNORMAL HIGH (ref 0.0–0.2)
Indirect Bilirubin: 1.2 mg/dL — ABNORMAL HIGH (ref 0.3–0.9)
Total Bilirubin: 1.9 mg/dL — ABNORMAL HIGH (ref 0.3–1.2)
Total Protein: 5.5 g/dL — ABNORMAL LOW (ref 6.5–8.1)

## 2019-08-27 LAB — CBC
HCT: 23.3 % — ABNORMAL LOW (ref 39.0–52.0)
Hemoglobin: 7.4 g/dL — ABNORMAL LOW (ref 13.0–17.0)
MCH: 31.8 pg (ref 26.0–34.0)
MCHC: 31.8 g/dL (ref 30.0–36.0)
MCV: 100 fL (ref 80.0–100.0)
Platelets: 95 10*3/uL — ABNORMAL LOW (ref 150–400)
RBC: 2.33 MIL/uL — ABNORMAL LOW (ref 4.22–5.81)
RDW: 19.5 % — ABNORMAL HIGH (ref 11.5–15.5)
WBC: 9.3 10*3/uL (ref 4.0–10.5)
nRBC: 0 % (ref 0.0–0.2)

## 2019-08-27 LAB — PROTIME-INR
INR: 1.2 (ref 0.8–1.2)
Prothrombin Time: 15 seconds (ref 11.4–15.2)

## 2019-08-27 LAB — PHOSPHORUS: Phosphorus: 4 mg/dL (ref 2.5–4.6)

## 2019-08-27 LAB — HAPTOGLOBIN: Haptoglobin: 51 mg/dL (ref 23–355)

## 2019-08-27 LAB — SURGICAL PATHOLOGY

## 2019-08-27 LAB — PREPARE RBC (CROSSMATCH)

## 2019-08-27 MED ORDER — AMLODIPINE BESYLATE 5 MG PO TABS: 5.0000 mg | ORAL_TABLET | Freq: Every day | ORAL | 3 refills | Status: AC

## 2019-08-27 MED ORDER — EPOETIN ALFA 10000 UNIT/ML IJ SOLN
10000.0000 [IU] | INTRAMUSCULAR | Status: DC
  Filled 2019-08-27: qty 1

## 2019-08-27 MED ORDER — OXYCODONE HCL 5 MG PO TABS
5.0000 mg | ORAL_TABLET | Freq: Four times a day (QID) | ORAL | Status: DC | PRN
  Administered 2019-08-27 – 2019-08-28 (×3): 5 mg via ORAL
  Filled 2019-08-27 (×3): qty 1

## 2019-08-27 MED ORDER — SODIUM CHLORIDE 0.9 % IV SOLN: 2.0000 g | INTRAVENOUS | Status: DC

## 2019-08-27 MED ORDER — LEVOFLOXACIN 250 MG PO TABS: 250.0000 mg | ORAL_TABLET | ORAL | Status: DC

## 2019-08-27 MED ORDER — APIXABAN 5 MG PO TABS: 5.0000 mg | ORAL_TABLET | Freq: Two times a day (BID) | ORAL | Status: DC

## 2019-08-27 MED ORDER — APIXABAN 2.5 MG PO TABS
2.5000 mg | ORAL_TABLET | Freq: Two times a day (BID) | ORAL | Status: DC
  Administered 2019-08-27 – 2019-08-28 (×2): 2.5 mg via ORAL
  Filled 2019-08-27 (×2): qty 1

## 2019-08-27 MED ORDER — LEVOFLOXACIN 500 MG PO TABS
500.0000 mg | ORAL_TABLET | Freq: Once | ORAL | Status: AC
  Administered 2019-08-27: 18:00:00 500 mg via ORAL
  Filled 2019-08-27: qty 1

## 2019-08-27 MED ORDER — SODIUM CHLORIDE 0.9 % IV SOLN
500.0000 mg | INTRAVENOUS | Status: DC
  Administered 2019-08-27: 19:00:00 500 mg via INTRAVENOUS
  Filled 2019-08-27: qty 0.5
  Filled 2019-08-27: qty 500

## 2019-08-27 NOTE — Progress Notes (Signed)
This note also relates to the following rows which could not be included: Pulse Rate - Cannot attach notes to unvalidated device data Resp - Cannot attach notes to unvalidated device data BP - Cannot attach notes to unvalidated device data  Hd started  

## 2019-08-27 NOTE — Progress Notes (Signed)
[facility]  ROUNDING NOTE   Subjective:  Patient seen and evaluated during hemodialysis. Appears to be tolerating well. AV graft was excised.   Objective:  Vital signs in last 24 hours:  Temp:  [96.8 F (36 C)-98.4 F (36.9 C)] 97.5 F (36.4 C) (11/05 0910) Pulse Rate:  [73-106] 77 (11/05 0930) Resp:  [11-29] 11 (11/05 0930) BP: (132-165)/(73-118) 137/81 (11/05 0930) SpO2:  [90 %-100 %] 90 % (11/05 0518)  Weight change:  Filed Weights   08/24/19 1153 08/24/19 1807 08/26/19 0921  Weight: 47.6 kg 48.2 kg 47.6 kg    Intake/Output: I/O last 3 completed shifts: In: 1223 [P.O.:240; I.V.:300; Blood:483; IV [id number] Out: 2060 [Other:2010; Blood:50]   Intake/Output this shift:  No intake/output data recorded.  Physical Exam: General: Chronically ill-appearing  Head: Normocephalic, atraumatic. Moist oral mucosal membranes  Eyes: Anicteric  Neck: Supple, trachea midline  Lungs:  Clear to auscultation, normal effort  Heart: S1S2 no rubs  Abdomen:  Soft, nontender, bowel sounds present  Extremities: Trace peripheral edema.  Neurologic: Awake, alert, following commands  Skin: No lesions  Access: R IJ permcath, cuff right at exit site, right femoral dialysis catheter.    Basic Metabolic Panel: Recent Labs  Lab 08/24/19 1218 08/25/19 0251 08/25/19 1155 08/26/19 0426 08/27/19 0456  NA 133* 139  --  138 138  K 6.1* 5.4*  --  3.4* 4.1  CL 93* 98  --  102 100  CO2 13* 23  --  24 24  GLUCOSE 129* 113*  --  135* 169*  BUN 124* 128*  --  47* 56*  CREATININE 8.14* 7.83*  --  3.92* 4.62*  CALCIUM 9.0 8.5*  --  8.0* 8.4*  MG  --  2.5*  --  1.9 2.0  PHOS  --   --  6.3*  --   --     Liver Function Tests: Recent Labs  Lab 08/24/19 1218 08/26/19 0426  AST 475* 162*  ALT 515* 322*  ALKPHOS 137* 107  BILITOT 3.2* 2.5*  PROT 6.3* 5.2*  ALBUMIN 3.3* 2.7*   No results for input(s): LIPASE, AMYLASE in the last 168 hours. No results for input(s):  AMMONIA in the last 168 hours.  CBC: Recent Labs  Lab 08/24/19 1218 08/25/19 0251 08/25/19 1455 08/26/19 0426 08/26/19 1506 08/27/19 0456  WBC 12.4* 8.3 10.9* 6.6 7.4 9.3  NEUTROABS 10.2*  --   --   --   --   --   HGB 10.1* 8.8* 9.2* 8.2* 8.4* 7.4*  HCT 32.1* 27.8* 28.7* 25.9* 26.5* 23.3*  MCV 103.2* 101.8* 101.1* 100.4* 102.3* 100.0  PLT 51* 34* 35* 29* 108* 95*    Cardiac Enzymes: No results for input(s): CKTOTAL, CKMB, CKMBINDEX, TROPONINI in the last 168 hours.  BNP: Invalid input(s): POCBNP  CBG: Recent Labs  Lab 08/24/19 1806  GLUCAP 64    Microbiology: Results for orders placed or performed during the hospital encounter of 08/24/19  Aerobic/Anaerobic Culture (surgical/deep wound)     Status: None (Preliminary result)   Collection Time: 08/24/19 12:22 PM   Specimen: Wound  Result Value Ref Range Status   Specimen Description   Final    WOUND Performed at [facility], [address] [place], [place] [address]    Special Requests   Final    Normal Performed at [facility], [address] [place], [place] [address]    Gram Stain   Final    RARE WBC PRESENT, PREDOMINANTLY  PMN RARE GRAM POSITIVE COCCI IN PAIRS Performed at [facility], [place] [address] [place], [place] [address]    Culture   Final    FEW STENOTROPHOMONAS MALTOPHILIA FEW ACINETOBACTER BAUMANNII NO ANAEROBES ISOLATED; CULTURE IN PROGRESS FOR 5 DAYS    Report Status PENDING  Incomplete  SARS Coronavirus 2 by RT PCR (hospital order, performed in [place] hospital lab) Nasopharyngeal Nasopharyngeal Swab     Status: None   Collection Time: 08/24/19  1:37 PM   Specimen: Nasopharyngeal Swab  Result Value Ref Range Status   SARS Coronavirus 2 NEGATIVE NEGATIVE Final    Comment: (NOTE) If result is NEGATIVE SARS-CoV-2 target nucleic acids are NOT DETECTED. The SARS-CoV-2 RNA is generally detectable in upper and lower  respiratory specimens during the acute  phase of infection. The lowest  concentration of SARS-CoV-2 viral copies this assay can detect is 250  copies / mL. A negative result does not preclude SARS-CoV-2 infection  and should not be used as the sole basis for treatment or other  patient management decisions.  A negative result may occur with  improper specimen collection / handling, submission of specimen other  than nasopharyngeal swab, presence of viral mutation(s) within the  areas targeted by this assay, and inadequate number of viral copies  (<250 copies / mL). A negative result must be combined with clinical  observations, patient history, and epidemiological information. If result is POSITIVE SARS-CoV-2 target nucleic acids are DETECTED. The SARS-CoV-2 RNA is generally detectable in upper and lower  respiratory specimens dur ing the acute phase of infection.  Positive  results are indicative of active infection with SARS-CoV-2.  Clinical  correlation with patient history and other diagnostic information is  necessary to determine patient infection status.  Positive results do  not rule out bacterial infection or co-infection with other viruses. If result is PRESUMPTIVE POSTIVE SARS-CoV-2 nucleic acids MAY BE PRESENT.   A presumptive positive result was obtained on the submitted specimen  and confirmed on repeat testing.  While 2019 novel coronavirus  (SARS-CoV-2) nucleic acids may be present in the submitted sample  additional confirmatory testing may be necessary for epidemiological  and / or clinical management purposes  to differentiate between  SARS-CoV-2 and other Sarbecovirus currently known to infect humans.  If clinically indicated additional testing with an alternate test  methodology [phone]) is advised. The SARS-CoV-2 RNA is generally  detectable in upper and lower respiratory sp ecimens during the acute  phase of infection. The expected result is Negative. Fact Sheet for Patients:   StrictlyIdeas.no Fact Sheet for Healthcare Providers: BankingDealers.co.za This test is not yet approved or cleared by the [place] FDA and has been authorized for detection and/or diagnosis of SARS-CoV-2 by FDA under an Emergency Use Authorization (EUA).  This EUA will remain in effect (meaning this test can be used) for the duration of the COVID-19 declaration under Section 564(b)(1) of the Act, 21 U.S.C. section 360bbb-3(b)(1), unless the authorization is terminated or revoked sooner. Performed at [facility], [address] [place], [place] [address]     Coagulation Studies: Recent Labs    08/25/19 0251 08/25/19 1455 08/26/19 0426  LABPROT 23.0* 22.1* 19.0*  INR 2.1* 2.0* 1.6*    Urinalysis: No results for input(s): COLORURINE, LABSPEC, PHURINE, GLUCOSEU, HGBUR, BILIRUBINUR, KETONESUR, PROTEINUR, UROBILINOGEN, NITRITE, LEUKOCYTESUR in the last 72 hours.  Invalid input(s): APPERANCEUR    Imaging: Dg Abd 1 View  Result Date: 08/25/2019 CLINICAL DATA:  Hemodialysis catheter  malfunction. EXAM: ABDOMEN - 1 VIEW COMPARISON:  Plain film of the abdomen dated 08/06/2019. FINDINGS: Vascular stents are stable in position/configuration. There is a new RIGHT femoral catheter with tip projected upwards to the level of the L1 vertebral body. Bowel gas pattern is nonobstructive. No evidence of soft tissue mass or abnormal fluid collection. No evidence of free intraperitoneal air seen. Surgical clips in the LEFT lower quadrant. IMPRESSION: 1. New RIGHT femoral catheter with tip projected upwards along the expected course of the IVC to the level of the L1 vertebral body. 2. Nonobstructive bowel gas pattern. Electronically Signed   By: Franki Cabot M.D.   On: 08/25/2019 14:55     Medications:    . amLODipine  5 mg Oral Daily  . apixaban  2.5 mg Oral BID  . carvedilol  25 mg Oral BID WC  . Chlorhexidine Gluconate Cloth  6  each Topical Q0600  . Chlorhexidine Gluconate Cloth  6 each Topical Q0600  . feeding supplement (NEPRO CARB STEADY)  237 mL Oral BID BM  . hydrALAZINE  25 mg Oral Q8H  . levETIRAcetam  500 mg Oral BID  . multivitamin  1 tablet Oral QHS  . vitamin C  250 mg Oral BID   hydrALAZINE, HYDROmorphone (DILAUDID) injection, ondansetron **OR** ondansetron (ZOFRAN) IV  Assessment/ Plan:  43 y.o. male with end-stage renal disease on hemodialysis, hypertension, peripheral vascular disease, depression, PEA event 10/20.  [place] TTS[place] AVG  1.  ESRD on HD TTS.  Patient has missed last 2 dialysis sessions as an outpatient.  -Patient seen and evaluated during dialysis treatment today.  Tolerating well.  We plan to complete dialysis treatment today.  We will remove temporary dialysis catheter in right femoral vein.  We will discuss what to do with his PermCath as cuff was very close to the exit site.  2.  Hyperkalemia.  Potassium down to 4.1.  Continue to monitor.  3.  Anemia chronic kidney disease.  Hemoglobin down to 7.4, likely secondary to surgical blood loss.  Start the patient on Epogen 10,000 IV with dialysis today.  4.  Secondary hyperparathyroidism.  Repeat serum phosphorus today.  5.  Wound dehiscence at left upper extremity AV access site.  Patient's AV graft was removed.  Appreciate vascular surgery assistance..   LOS: 3 Brett Small 11/5/20209:53 AM

## 2019-08-27 NOTE — Consult Note (Signed)
NAME: Brett Small  DOB: 03/14/1976  MRN: [id number]  Date/Time: 08/27/2019 3:26 PM  REQUESTING PROVIDER Subjective:  REASON FOR CONSULT:  ? 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 There was surgical site dehiscence of the right and it was washed and resutured on 08/14/2019. Patient noted that the surgical site was draining for almost a week.  He also had missed 2 dialysis treatments.  In the ED his white count was 12.4, BUN of 2024 and creatinine of 8.14.  Superficial wound culture was obtained in the ED. Vascular surgery was consulted he underwent excision of the entire right brachial artery to axillary vein AV graft and repair of right brachial artery with CorMatrix patch angioplasty on 08/26/2019.  There was no culture sent from the OR.  The surgeon's note said patent graft which was not particularly well incorporated but had no purulent material. I am asked to see the patient as the wound culture that was sent from the ER has Acinetobacter and stenotrophomonas.     Patient has a complicated medical history with frequent hospitalization in the past years  His last admission to the hospital was between 08/03/2019 until 08/17/2019. He was admitted with worsening pulmonary edema and placed on BiPAP.  He had a PEA arrest on 08/05/2019 and was resuscitated.  Was emergently intubated and was extubated on 08/07/2019. On 08/12/2019 he underwent creation of right upper arm brachial artery to axillary vein AV graft because the left arm graft was not functioning. On 08/13/2019 he underwent a covered stent placement over the left brachial  artery to exclude the pseudoaneurysm of the left brachial artery by Dr. Lucky Cowboy 08/14/2019 had dehiscence of the right upper extremity surgical site and underwent copious irrigation and reclosure. During that hospitalization he had received vancomycin and cefazolin for surgical prophylaxis only. --------------------------------------- 07/27/2019 until 07/29/2019 admitted with pulmonary edema.  This was due to missing dialysis.  During that hospitalization he was evaluated for AV access problem on the left side by vascular for disruption of the arterial anastomosis with an associated pseudoaneurysm of the left AV graft.  There was thrombus in the graft. --------------------------------------------- 07/20/2019 until 07/23/2019 admitted for shortness of breath ---------------------------------------------------- 07/09/2019 until 07/12/2019 admitted for shortness of breath.  Diagnosis was NSTEMI and HCAP ---------------------------------------------------------- 07/02/2019 to 07/06/2019 for HCAP and  nonspecific chest pain -------------------------------------------------------------- May 22, 2019 until June 02, 2019 transfer to [facility] from [facility]  with acute liver failure and GI bleeding. Unclear etiology.  But favored underlying NAFLD with potentiation from acetaminophen.(Received NAC empirically) additional work-up was notable for ASMA  1:160 but with low suspicion for autoimmune hepatitis as per hepatology at [facility] because of negative Peth.  Liver biopsy was not pursued because of thrombocytopenia.  Tickborne illness and HIT screen were also negative at that time -------------------------------------------------------- Feb 28, 2019 until Mar 04, 2019 HCAP  Medical history Anemia 10/01/2018   Anxiety   Aortobifemoral bypass graft thrombosis (CMS/HCC)   Arthritis   Clotting disorder (CMS/HCC)   ESRD on hemodialysis (CMS/HCC)   GERD (gastroesophageal reflux disease)   HFrEF (heart failure with reduced  ejection fraction) (CMS/HCC)  EF 35-40% Unknown etiology   HLD (hyperlipidemia)   HTN (hypertension)   Myocardial infarction (CMS/HCC)  pt denies   PAD (peripheral artery disease) (CMS/HCC)   Tobacco abuse  Past Surgical History:  Procedure Laterality Date   A/V SHUNT INTERVENTION Left 01/19/2019   Procedure: LEFT UPPER EXTREMITY A/V SHUNTOGRAM / UPPER EXTREMITY ANGIOGRAM;  Surgeon: Algernon Huxley, MD;  Location: [place] [facility];  Service: Cardiovascular;  Laterality: Left;   A/V SHUNT INTERVENTION Left 03/02/2019   Procedure: A/V SHUNT INTERVENTION;  Surgeon: Algernon Huxley, MD;  Location: [place] [facility];  Service: Cardiovascular;  Laterality: Left;   AORTA - FEMORAL ARTERY BYPASS GRAFT     AV FISTULA PLACEMENT Left 12/26/2018   Procedure: INSERTION OF GORE STRETCH VASCULAR 4-7MM X  45CM IN LEFT UPPER ARM;  Surgeon: Marty Heck, MD;  Location: [place];  Service: Vascular;  Laterality: Left;   AV FISTULA PLACEMENT Right 08/12/2019   Procedure: INSERTION OF ARTERIOVENOUS (AV) GORE-TEX GRAFT ARM;  Surgeon: Algernon Huxley, MD;  Location: [facility];  Service: Vascular;  Laterality: Right;   DIALYSIS/PERMA CATHETER REMOVAL N/A 02/06/2019   Procedure: DIALYSIS/PERMA CATHETER REMOVAL;  Surgeon: Algernon Huxley, MD;  Location: [place] [facility];  Service: Cardiovascular;  Laterality: N/A;   LEFT HEART CATH AND CORONARY ANGIOGRAPHY Right 07/10/2019   Procedure: LEFT HEART CATH AND CORONARY ANGIOGRAPHY;  Surgeon: Dionisio David, MD;  Location: [place] [facility];  Service: Cardiovascular;  Laterality: Right;   PERIPHERAL VASCULAR THROMBECTOMY Left 07/28/2019   Procedure: Left Upper Extremity Dialysis Access Declot;  Surgeon: Katha Cabal, MD;  Location: [place] [facility];  Service: Cardiovascular;  Laterality: Left;   REMOVAL OF A DIALYSIS CATHETER Right 08/26/2019   Procedure: REMOVAL OF A DIALYSIS CATHETER;  Surgeon: Algernon Huxley, MD;  Location: [facility];  Service:  Vascular;  Laterality: Right;   TEMPORARY DIALYSIS CATHETER N/A 08/25/2019   Procedure: TEMPORARY DIALYSIS CATHETER;  Surgeon: Katha Cabal, MD;  Location: [place] [facility];  Service: Cardiovascular;  Laterality: N/A;   UPPER EXTREMITY ANGIOGRAPHY Left 08/13/2019   Procedure: UPPER EXTREMITY ANGIOGRAPHy;  Surgeon: Algernon Huxley, MD;  Location: [place] [facility];  Service: Cardiovascular;  Laterality: Left;   12/26/18 INSERTION OF GORE STRETCH VASCULAR 4-7MM X 45CM IN LEFT UPPER ARM Social History   Socioeconomic History   Marital status: Single    Spouse name: Not on file   Number of children: Not on file   Years of education: Not on file   Highest education level: Not on file  Occupational History   Not on file  Social Needs   Financial resource strain: Not on file   Food insecurity    Worry: Not on file    Inability: Not on file   Transportation needs    Medical: Not on file    Non-medical: Not on file  Tobacco Use   Smoking status: Current Every Day Smoker    Packs/day: 0.50    Years: 30.00    Pack years: 15.00    Types: Cigarettes    Last attempt to quit: 06/26/2018    Years since quitting: 1.1   Smokeless tobacco: Never Used   Tobacco comment: smoked for 30 years   Substance and Sexual Activity   Alcohol use: Not Currently   Drug use: Yes    Frequency: 1.0 times per week    Types: Marijuana    Comment: Pt states "maybe once or  twice a week".    Sexual activity: Not on file  Lifestyle   Physical activity    Days per week: Not on file    Minutes per session: Not on file   Stress: Not on file  Relationships   Social connections    Talks on phone: Not on file    Gets together: Not on file    Attends religious service: Not on file    Active member of club or organization: Not on file    Attends meetings of clubs or organizations: Not on file    Relationship status: Not on file   Intimate partner violence    Fear of current or ex  partner: Patient refused    Emotionally abused: Patient refused    Physically abused: Patient refused    Forced sexual activity: Patient refused  Other Topics Concern   Not on file  Social History Narrative   Not on file    Family History  Problem Relation Age of Onset   Hypertension Other    Diabetes Other    Clotting disorder Father    Allergies  Allergen Reactions   Codeine Nausea Only    Patient questioned this (??)   Sulfa Antibiotics Hives and Nausea And Vomiting    ? Current Facility-Administered Medications  Medication Dose Route Frequency Provider Last Rate Last Dose   amLODipine (NORVASC) tablet 5 mg  5 mg Oral Daily Dew, Erskine Squibb, MD   5 mg at 08/27/19 1402   apixaban (ELIQUIS) tablet 2.5 mg  2.5 mg Oral BID Edwin Dada, MD       carvedilol (COREG) tablet 25 mg  25 mg Oral BID WC Algernon Huxley, MD   25 mg at 08/26/19 1714   Chlorhexidine Gluconate Cloth 2 % PADS 6 each  6 each Topical Q0600 Algernon Huxley, MD   6 each at 08/26/19 0619   Chlorhexidine Gluconate Cloth 2 % PADS 6 each  6 each Topical Q0600 Algernon Huxley, MD   6 each at 08/26/19 [phone]   epoetin alfa (EPOGEN) injection 10,000 Units  10,000 Units Intravenous Q T,Th,Sa-HD Lateef, Munsoor, MD       feeding supplement (NEPRO CARB STEADY) liquid 237 mL  237 mL Oral BID BM Algernon Huxley, MD   237 mL at 08/25/19 1100   hydrALAZINE (APRESOLINE) injection 10 mg  10 mg Intravenous Q6H PRN Algernon Huxley, MD       hydrALAZINE (APRESOLINE) tablet 25 mg  25 mg Oral Q8H Algernon Huxley, MD   25 mg at 08/27/19 0022   HYDROmorphone (DILAUDID) injection 0.5 mg  0.5 mg Intravenous Q4H PRN Algernon Huxley, MD   0.5 mg at 08/27/19 1410   levETIRAcetam (KEPPRA) tablet 500 mg  500 mg Oral BID Algernon Huxley, MD   500 mg at 08/27/19 1405   [START ON 08/29/2019] levofloxacin (LEVAQUIN) tablet 250 mg  250 mg Oral Q48H Bergen Melle, Joellyn Quails, MD       levofloxacin (LEVAQUIN) tablet 500 mg  500 mg Oral Once Tsosie Billing, MD       multivitamin (RENA-VIT) tablet 1 tablet  1 tablet Oral QHS Algernon Huxley, MD   1 tablet at 08/26/19 2136   ondansetron (ZOFRAN) tablet 4 mg  4 mg Oral Q6H PRN Algernon Huxley, MD   4 mg at 08/26/19 2136   Or   ondansetron (ZOFRAN) injection 4 mg  4 mg Intravenous Q6H PRN Algernon Huxley, MD  4 mg at 08/26/19 1127   vitamin C (ASCORBIC ACID) tablet 250 mg  250 mg Oral BID Algernon Huxley, MD   250 mg at 08/27/19 1406     Abtx:  Anti-infectives (From admission, onward)   Start     Dose/Rate Route Frequency Ordered Stop   08/29/19 1800  levofloxacin (LEVAQUIN) tablet 250 mg     250 mg Oral Every 48 hours 08/27/19 1519     08/29/19 1800  ceFEPIme (MAXIPIME) 2 g in sodium chloride 0.9 % 100 mL IVPB  Status:  Discontinued     2 g 200 mL/hr over 30 Minutes Intravenous Every T-Th-Sa (Hemodialysis) 08/27/19 1519 08/27/19 1524   08/27/19 1800  levofloxacin (LEVAQUIN) tablet 500 mg     500 mg Oral  Once 08/27/19 1519     08/26/19 1000  vancomycin (VANCOCIN) IVPB 1000 mg/200 mL premix    Note to Pharmacy: To be sent with patien to OR   1,000 mg 200 mL/hr over 60 Minutes Intravenous  Once 08/25/19 0950 08/26/19 1219   08/26/19 0935  ceFAZolin (ANCEF) 1-4 GM/50ML-% IVPB    Note to Pharmacy: Milinda Cave   : cabinet override      08/26/19 0935 08/26/19 1102   08/25/19 0600  ceFAZolin (ANCEF) IVPB 1 g/50 mL premix  Status:  Discontinued     1 g 100 mL/hr over 30 Minutes Intravenous On call to O.R. 08/25/19 0058 08/25/19 0955   08/24/19 1230  vancomycin (VANCOCIN) IVPB 1000 mg/200 mL premix     1,000 mg 200 mL/hr over 60 Minutes Intravenous  Once 08/24/19 1221 08/24/19 1338      REVIEW OF SYSTEMS:  Const: negative fever, negative chills, negative weight loss Eyes: negative diplopia or visual changes, negative eye pain ENT: negative coryza, negative sore throat Resp: negative cough, hemoptysis, dyspnea Cards: negative for chest pain, palpitations, lower extremity edema GU:  negative for frequency, dysuria and hematuria GI: Negative for abdominal pain, diarrhea, bleeding, constipation Skin: negative for rash and pruritus Heme: negative for easy bruising and gum/nose bleeding MS: negative for myalgias, arthralgias, back pain and muscle weakness Neurolo:negative for headaches, dizziness, vertigo, memory problems  Psych: Has depression Endocrine: negative for thyroid, diabetes issues Allergy/Immunology-as noted above Objective:  VITALS:  BP (!) 164/83 (BP Location: Right Leg)    Pulse 91    Temp 98.3 F (36.8 C) (Oral)    Resp 15    Ht 5\' 1"  (1.549 m)    Wt 47.6 kg    SpO2 93%    BMI 19.84 kg/m  PHYSICAL EXAM:  General: Alert, cooperative, no distress, appears stated age.  Head: Normocephalic, without obvious abnormality, atraumatic. Eyes: Conjunctivae clear, anicteric sclerae. Pupils are equal ENT Nares normal. No drainage or sinus tenderness. Lips, mucosa, and tongue normal. No Thrush, edentulous Neck: Supple, symmetrical, no adenopathy, thyroid: non tender no carotid bruit and no JVD. Back: No CVA tenderness. Lungs: Clear to auscultation bilaterally. No Wheezing or Rhonchi. No rales. Heart: Regular rate and rhythm, no murmur, rub or gallop. Abdomen: Soft, non-tender,not distended. Bowel sounds normal. No masses Extremities:  Prior to surgery    Now has surgical dressing on the rt arm which I did not remove   Skin: No rashes or lesions. Or bruising Lymph: Cervical, supraclavicular normal. Neurologic: Grossly non-focal Has right subclavian catheter and right femoral catheter Pertinent Labs Lab Results CBC    Component Value Date/Time   WBC 9.3 08/27/2019 0456   RBC 2.33 (L) 08/27/2019 0456  HGB 7.4 (L) 08/27/2019 0456   HCT 23.3 (L) 08/27/2019 0456   PLT 95 (L) 08/27/2019 0456   MCV 100.0 08/27/2019 0456   MCH 31.8 08/27/2019 0456   MCHC 31.8 08/27/2019 0456   RDW 19.5 (H) 08/27/2019 0456   LYMPHSABS 1.1 08/24/2019 1218   MONOABS 0.9  08/24/2019 1218   EOSABS 0.0 08/24/2019 1218   BASOSABS 0.0 08/24/2019 1218    CMP Latest Ref Rng & Units 08/27/2019 08/26/2019 08/25/2019  Glucose 70 - 99 mg/dL 169(H) 135(H) 113(H)  BUN 6 - 20 mg/dL 56(H) 47(H) 128(H)  Creatinine 0.61 - 1.24 mg/dL 4.62(H) 3.92(H) 7.83(H)  Sodium 135 - 145 mmol/L 138 138 139  Potassium 3.5 - 5.1 mmol/L 4.1 3.4(L) 5.4(H)  Chloride 98 - 111 mmol/L 100 102 98  CO2 22 - 32 mmol/L 24 24 23   Calcium 8.9 - 10.3 mg/dL 8.4(L) 8.0(L) 8.5(L)  Total Protein 6.5 - 8.1 g/dL 5.5(L) 5.2(L) -  Total Bilirubin 0.3 - 1.2 mg/dL 1.9(H) 2.5(H) -  Alkaline Phos 38 - 126 U/L 106 107 -  AST 15 - 41 U/L 79(H) 162(H) -  ALT 0 - 44 U/L 168(H) 322(H) -      Microbiology: Recent Results (from the past 240 hour(s))  Aerobic/Anaerobic Culture (surgical/deep wound)     Status: None (Preliminary result)   Collection Time: 08/24/19 12:22 PM   Specimen: Wound  Result Value Ref Range Status   Specimen Description   Final    WOUND Performed at [facility], [address] [place], [place] [address]    Special Requests   Final    Normal Performed at [facility], [address] [place], [place] [address]    Gram Stain   Final    RARE WBC PRESENT, PREDOMINANTLY PMN RARE GRAM POSITIVE COCCI IN PAIRS Performed at [facility], [place] [address] [place], [place] [address]    Culture   Final    FEW STENOTROPHOMONAS MALTOPHILIA FEW ACINETOBACTER BAUMANNII SUSCEPTIBILITIES TO FOLLOW NO ANAEROBES ISOLATED; CULTURE IN PROGRESS FOR 5 DAYS    Report Status PENDING  Incomplete  SARS Coronavirus 2 by RT PCR (hospital order, performed in [place] hospital lab) Nasopharyngeal Nasopharyngeal Swab     Status: None   Collection Time: 08/24/19  1:37 PM   Specimen: Nasopharyngeal Swab  Result Value Ref Range Status   SARS Coronavirus 2 NEGATIVE NEGATIVE Final    Comment: (NOTE) If result is NEGATIVE SARS-CoV-2 target nucleic acids are NOT DETECTED. The  SARS-CoV-2 RNA is generally detectable in upper and lower  respiratory specimens during the acute phase of infection. The lowest  concentration of SARS-CoV-2 viral copies this assay can detect is 250  copies / mL. A negative result does not preclude SARS-CoV-2 infection  and should not be used as the sole basis for treatment or other  patient management decisions.  A negative result may occur with  improper specimen collection / handling, submission of specimen other  than nasopharyngeal swab, presence of viral mutation(s) within the  areas targeted by this assay, and inadequate number of viral copies  (<250 copies / mL). A negative result must be combined with clinical  observations, patient history, and epidemiological information. If result is POSITIVE SARS-CoV-2 target nucleic acids are DETECTED. The SARS-CoV-2 RNA is generally detectable in upper and lower  respiratory specimens dur ing the acute phase of infection.  Positive  results are indicative of active infection with SARS-CoV-2.  Clinical  correlation with patient history  and other diagnostic information is  necessary to determine patient infection status.  Positive results do  not rule out bacterial infection or co-infection with other viruses. If result is PRESUMPTIVE POSTIVE SARS-CoV-2 nucleic acids MAY BE PRESENT.   A presumptive positive result was obtained on the submitted specimen  and confirmed on repeat testing.  While 2019 novel coronavirus  (SARS-CoV-2) nucleic acids may be present in the submitted sample  additional confirmatory testing may be necessary for epidemiological  and / or clinical management purposes  to differentiate between  SARS-CoV-2 and other Sarbecovirus currently known to infect humans.  If clinically indicated additional testing with an alternate test  methodology [phone]) is advised. The SARS-CoV-2 RNA is generally  detectable in upper and lower respiratory sp ecimens during the acute    phase of infection. The expected result is Negative. Fact Sheet for Patients:  StrictlyIdeas.no Fact Sheet for Healthcare Providers: BankingDealers.co.za This test is not yet approved or cleared by the [place] FDA and has been authorized for detection and/or diagnosis of SARS-CoV-2 by FDA under an Emergency Use Authorization (EUA).  This EUA will remain in effect (meaning this test can be used) for the duration of the COVID-19 declaration under Section 564(b)(1) of the Act, 21 U.S.C. section 360bbb-3(b)(1), unless the authorization is terminated or revoked sooner. Performed at [facility], [address] [place], [place] [address]     IMAGING RESULTS: Chest x-ray cardiomegaly with vascular congestion I have personally reviewed the films ? Impression/Recommendation ? ?Dehiscence of the right AV graft which was placed on 08/12/2019.  Draining wound at the graft site.  Superficial cultures done in the ED on 08/24/2019  had Acinetobacter and stenotrophomonas.  Patient had complete excision of the graft on 08/26/2019.  I am not sure how the postsurgical wound looks like now need to talk with vascular.  Also no surgical cultures were sent and hence do not know for sure whether the Acinetobacter and stenotrophomonas was just colonizing the wound.  Until there is some clarification with vascular team I will send blood culture and start the patient on Levaquin and meropenem.  If this was just a superficial wound with no erosion of the graft or vascular connection we may be able to get away with p.o. antibiotics for a week or so.  Await susceptibility of the 2 organisms.  Patient has 2 dialysis catheter 1 on the right chest another 1 on the right femoral.  Need to remove the right femoral if the chest catheter is functioning as risk for infection.   End-stage renal disease with poor compliance.  Congestive heart failure and history  of PEA  Peripheral artery disease with bilateral ileofemoral bypass.  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  This thrombocytopenia has started since July when the patient was in acute liver failure and had to be transferred to [facility] as well.   Hematology following Also noted in the peripheral smear Red cell fragments and thrombocytopenia and pathologist raised the concern for TTP, [id number] testing on 11/2 and 11/4  Transaminitis Total bilirubin elevated with indirect more than direct.  Is this hemolysis? Does he have an underlying liver disease?.  AST ALT was high on admission and is improved.  Is he on any hepatotoxic drugs like acetaminophen at home or does he have an autoimmune hepatitis as he had ASMA titer of 1 and 160 at [facility] .  HIV NR? ___________________________________________________  Discussed with patient, requesting provider Note:  This document was prepared using Dragon voice recognition software and may include unintentional dictation errors.

## 2019-08-27 NOTE — Care Management (Signed)
RNCM attempted to see patient for high risk readmission screen Patient off the floor for HD will attempt at a later time

## 2019-08-27 NOTE — Progress Notes (Signed)
This note also relates to the following rows which could not be included: Pulse Rate - Cannot attach notes to unvalidated device data Resp - Cannot attach notes to unvalidated device data BP - Cannot attach notes to unvalidated device data  Hd completed  

## 2019-08-27 NOTE — Progress Notes (Signed)
PROGRESS NOTE    Brett Small  [id number] DOB: 1976/06/16 DOA: 08/24/2019 PCP: Patient, [name]      Brief Narrative:  Brett Small is a 43 y.o. M with ESRD, HTN, PVD s/p aortofem bypass, depression and recent PEA arrest who presented with 1 week draining from right arm wound and missed HD.     In the ER, K 6.1, WBC 12K.  Vascular surgery consulted and noted plan for wound repair on hospital day 2.     Assessment & Plan:  Wound dehiscence S/p excision of RIGHT arm AV graft and brachial artery repair with patch angioplasty 11/4 by Dr. Lucky Cowboy  Intraoperative cultures growing Acinetobacter and stenotrophomonas. -Start cefepime -Consult infectious disease -Consult Vascular surgery, appreciate cares -Post-op care per [facility]    Thrombocytopenia New. Recurrent.  No clinical symptoms to suggest TTP and smear with fragments, no schistocytes.  Otherwise smear normal, counts near baseline, leukemia considered less likely.    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  Afebrile.  Transfused 2 packs pre-op yesterday, Plts 95K today. No oozing overnight.  Low level schistocytes on smear yesterday, Heme and I clinically do not suspect acute DIC or TTP. -Consult Hematology -Resume Eliquis -Hold aspirin  Acute blood loss anemia of chronic renal disease Hgb down to 7.4 post-op today.  No clinical bleeding.  Transaminitis Same as July.  Transaminases improving.  INR normalized.  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  INR 2 on admission, improving yesterday.    US liver 2 weeks PTA showed normal sonogrpahic appearance of liver, small ascites. -STOP acetaminophen -Trend LFTs -Follow-up with Dr. Damita Lack from [facility] after discharge  ESRD -Consult Nephrology for maintenance HD, appreciate cares  Hypertension BP elevated  -Continue carvedilol, hydralazine, amlodipine -Hold Lipitor given LFTs  Depression  Moderate protein calorie malnutrition  Smoking Cesation recommended  Chronic systolic CHF EF [id number] in Oct.  Appears euvolemic.   -Fluid status managmement with HD  Smoking, tobacco, marijuana  Questionable seizure disorder Diagnosed during previous hospitalization.   No seizure like activity here -Continue Keppra            MDM and disposition: The below labs and imaging reports reviewed and summarized above.  Medication management as above.   The patient was admitted with wound dehiscence and expose AV graft.  Taken to the OR 11/4, had graft excision, primary wound closure.  Intraoperative cultures growing stenotrophomonas and Acinetobacter.  There is a complicated infection that will require infectious disease involvement, sensitivities from culture, and careful post-discharge antibiotic planning.  Continue IV cefepime.       DVT prophylaxis: SCDs Code Status: FULL Family Communication:     Consultants:   Vascular surgery  Hematology  Infectious disease  Procedures:   11/4 excision of graft, and artery repair  Antimicrobials:   Vancomycin x1   Cefepime 11/5 >>   Subjective: He still having some arm pain, and has some right arm swelling, but no abdominal pain, vomiting, confusion, bleeding, no epistaxis.       Objective: Vitals:   08/27/19 1230 08/27/19 1245 08/27/19 1255 08/27/19 1348  BP: 133/74   (!) 164/83  Pulse: 85  88 91  Resp: 13  15 15   Temp:  98.1 F (36.7 C)  98.3 F (36.8 C)  TempSrc:    Oral  SpO2:    93%  Weight:      Height:        Intake/Output Summary (Last 24 hours) at 08/27/2019 1508 Last data filed at 08/27/2019 1245 Gross per 24 hour  Intake 440 ml  Output 1500 ml  Net -1060 ml   Filed Weights   08/24/19 1153 08/24/19 1807 08/26/19 0921  Weight: 47.6 kg 48.2 kg 47.6 kg    Examination: General appearance:  Chronically ill-appearing adult male, sleepy while on dialysis but in no acute distress HEENT: Slight icterus, conjunctiva pink, lids and lashes normal. No nasal deformity, discharge, epistaxis.  Lips moist, dentition poor.  Oropharynx moist, no oral lesions, hearing normal.    Skin: Incision not inspected. Cardiac: RRR, no murmurs, JVP normal, no lower extremity edema.  He has some right arm pitting edema.    Respiratory: Normal respiratory rate and rhythm, lungs clear, no rales or wheezes. Abdomen: Abdomen soft, no tenderness to palpation, no ascites or distention. MSK: No deformities or effusions of the large joints of the upper or lower extremities bilaterally. Neuro: Awake and alert, extraocular movements intact, moves all extremities with global weakness but symmetric strength, speech fluent. Psych: Sensorium intact responding questions, attention normal, affect normal, judgment insight normal.      Data Reviewed: I have personally reviewed following labs and imaging studies:  CBC: Recent Labs  Lab 08/24/19 1218 08/25/19 0251 08/25/19 1455 08/26/19 0426 08/26/19 1506 08/27/19 0456  WBC 12.4* 8.3 10.9* 6.6 7.4 9.3  NEUTROABS 10.2*  --   --   --   --   --   HGB 10.1* 8.8* 9.2* 8.2* 8.4* 7.4*  HCT 32.1* 27.8* 28.7* 25.9* 26.5* 23.3*  MCV 103.2* 101.8* 101.1* 100.4* 102.3* 100.0  PLT 51* 34* 35* 29* 108* 95*   Basic Metabolic Panel: Recent Labs  Lab 08/24/19 1218 08/25/19 0251 08/25/19 1155 08/26/19 0426 08/27/19 0456 08/27/19 0933  NA 133* 139  --  138 138  --   K 6.1* 5.4*  --  3.4* 4.1  --   CL 93* 98  --  102 100  --   CO2 13* 23  --  24 24  --   GLUCOSE 129* 113*  --  135* 169*  --   BUN 124* 128*  --  47* 56*  --   CREATININE 8.14* 7.83*  --  3.92* 4.62*  --   CALCIUM 9.0 8.5*  --  8.0* 8.4*  --   MG  --  2.5*  --  1.9 2.0  --   PHOS  --   --  6.3*  --   --  4.0   GFR: Estimated Creatinine Clearance: 13.6 mL/min (A) (by C-G formula based on SCr of 4.62  mg/dL (H)). Liver Function Tests: Recent Labs  Lab 08/24/19 1218 08/26/19 0426 08/27/19 0933  AST 475* 162* 79*  ALT 515* 322* 168*  ALKPHOS 137* 107 106  BILITOT 3.2* 2.5* 1.9*  PROT 6.3* 5.2* 5.5*  ALBUMIN 3.3* 2.7* 2.7*   No results for input(s): LIPASE, AMYLASE in the last 168 hours. No results for input(s): AMMONIA in the last 168 hours. Coagulation Profile: Recent Labs  Lab 08/25/19 0251 08/25/19 1455 08/26/19 0426 08/27/19 1204  INR 2.1* 2.0* 1.6* 1.2   Cardiac Enzymes: No results for input(s): CKTOTAL, CKMB, CKMBINDEX, TROPONINI in the last 168 hours. BNP (last 3 results) No results for input(s): PROBNP in the last 8760 hours. HbA1C: No results for input(s): HGBA1C in the last 72 hours. CBG: Recent Labs  Lab  08/24/19 1806  GLUCAP 80   Lipid Profile: No results for input(s): CHOL, HDL, LDLCALC, TRIG, CHOLHDL, LDLDIRECT in the last 72 hours. Thyroid Function Tests: No results for input(s): TSH, T4TOTAL, FREET4, T3FREE, THYROIDAB in the last 72 hours. Anemia Panel: Recent Labs    08/26/19 0426 08/26/19 0856  VITAMINB12 7,110*  --   FOLATE  --  14.9  RETICCTPCT 6.0*  --    Urine analysis:    Component Value Date/Time   COLORURINE AMBER (A) 08/05/2019 2049   APPEARANCEUR CLOUDY (A) 08/05/2019 2049   LABSPEC 1.023 08/05/2019 2049   PHURINE 6.0 08/05/2019 2049   GLUCOSEU NEGATIVE 08/05/2019 2049   HGBUR MODERATE (A) 08/05/2019 2049   [place] NEGATIVE 08/05/2019 2049   [place] NEGATIVE 08/05/2019 2049   PROTEINUR 100 (A) 08/05/2019 2049   NITRITE NEGATIVE 08/05/2019 2049   LEUKOCYTESUR SMALL (A) 08/05/2019 2049   Sepsis Labs: @LABRCNTIP (procalcitonin:4,lacticacidven:4)  ) Recent Results (from the past 240 hour(s))  Aerobic/Anaerobic Culture (surgical/deep wound)     Status: None (Preliminary result)   Collection Time: 08/24/19 12:22 PM   Specimen: Wound  Result Value Ref Range Status   Specimen Description   Final    WOUND Performed at  [facility], [address], [place], [place] [address]    Special Requests   Final    Normal Performed at [facility], [address] [place], [place] [address]    Gram Stain   Final    RARE WBC PRESENT, PREDOMINANTLY PMN RARE GRAM POSITIVE COCCI IN PAIRS Performed at [facility], [place] [address], [place], [place] [address]    Culture   Final    FEW STENOTROPHOMONAS MALTOPHILIA FEW ACINETOBACTER BAUMANNII SUSCEPTIBILITIES TO FOLLOW NO ANAEROBES ISOLATED; CULTURE IN PROGRESS FOR 5 DAYS    Report Status PENDING  Incomplete  SARS Coronavirus 2 by RT PCR (hospital order, performed in [place] hospital lab) Nasopharyngeal Nasopharyngeal Swab     Status: None   Collection Time: 08/24/19  1:37 PM   Specimen: Nasopharyngeal Swab  Result Value Ref Range Status   SARS Coronavirus 2 NEGATIVE NEGATIVE Final    Comment: (NOTE) If result is NEGATIVE SARS-CoV-2 target nucleic acids are NOT DETECTED. The SARS-CoV-2 RNA is generally detectable in upper and lower  respiratory specimens during the acute phase of infection. The lowest  concentration of SARS-CoV-2 viral copies this assay can detect is 250  copies / mL. A negative result does not preclude SARS-CoV-2 infection  and should not be used as the sole basis for treatment or other  patient management decisions.  A negative result may occur with  improper specimen collection / handling, submission of specimen other  than nasopharyngeal swab, presence of viral mutation(s) within the  areas targeted by this assay, and inadequate number of viral copies  (<250 copies / mL). A negative result must be combined with clinical  observations, patient history, and epidemiological information. If result is POSITIVE SARS-CoV-2 target nucleic acids are DETECTED. The SARS-CoV-2 RNA is generally detectable in upper and lower  respiratory specimens dur ing the acute phase of infection.  Positive  results are indicative of  active infection with SARS-CoV-2.  Clinical  correlation with patient history and other diagnostic information is  necessary to determine patient infection status.  Positive results do  not rule out bacterial infection or co-infection with other viruses. If result is PRESUMPTIVE POSTIVE SARS-CoV-2 nucleic acids MAY BE PRESENT.   A presumptive positive result was obtained on the  submitted specimen  and confirmed on repeat testing.  While 2019 novel coronavirus  (SARS-CoV-2) nucleic acids may be present in the submitted sample  additional confirmatory testing may be necessary for epidemiological  and / or clinical management purposes  to differentiate between  SARS-CoV-2 and other Sarbecovirus currently known to infect humans.  If clinically indicated additional testing with an alternate test  methodology [phone]) is advised. The SARS-CoV-2 RNA is generally  detectable in upper and lower respiratory sp ecimens during the acute  phase of infection. The expected result is Negative. Fact Sheet for Patients:  StrictlyIdeas.no Fact Sheet for Healthcare Providers: BankingDealers.co.za This test is not yet approved or cleared by the [place] FDA and has been authorized for detection and/or diagnosis of SARS-CoV-2 by FDA under an Emergency Use Authorization (EUA).  This EUA will remain in effect (meaning this test can be used) for the duration of the COVID-19 declaration under Section 564(b)(1) of the Act, 21 U.S.C. section 360bbb-3(b)(1), unless the authorization is terminated or revoked sooner. Performed at [facility], [address], [place], [place] [address]          Radiology Studies: No results found.      Scheduled Meds: . amLODipine  5 mg Oral Daily  . apixaban  2.5 mg Oral BID  . carvedilol  25 mg Oral BID WC  . Chlorhexidine Gluconate Cloth  6 each Topical Q0600  . Chlorhexidine Gluconate Cloth  6 each  Topical Q0600  . epoetin (EPOGEN/PROCRIT) injection  10,000 Units Intravenous Q T,Th,Sa-HD  . feeding supplement (NEPRO CARB STEADY)  237 mL Oral BID BM  . hydrALAZINE  25 mg Oral Q8H  . levETIRAcetam  500 mg Oral BID  . multivitamin  1 tablet Oral QHS  . vitamin C  250 mg Oral BID   Continuous Infusions:    LOS: 3 days    Time spent: 25 minutes   Edwin Dada, MD [facility] 08/27/2019, 3:08 PM     Please page through AMION:  [email or web address] Password [email or web address] If 7PM-7AM, please contact night-coverage

## 2019-08-27 NOTE — Consult Note (Signed)
Hematology/Oncology Consult note [facility] [phone] [phone]  Patient Care Team: Patient, [name] as PCP - General (General Practice)   Name of the patient: Brett Small  [id number]  May 05, 1976   Date of visit: 08/27/19 REASON FOR COSULTATION:  Thrombocytopenia  History of presenting illness-  43 y.o. 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 Initial blood work showed mildly elevated white count 12.4, creatinine 8.14, potassium 6.1.  Patient got dialysis.  Vascular surgery consulted and patient was taken to the OR today for wound repair. Patient was admitted and heme-onc was consulted for patient's thrombocytopenia.  Patient had history of thrombocytopenia, during his previous admission he July 2020 when he was in acute liver failure coagulopathy.  Thrombocytopenia was felt to be related to acute liver failure  patient was transferred to tertiary center for acute liver injury, had empiric NAC protocol. Liver biopsy was not done due to thrombocytopenia. His LFT improved.    Attempted to see patient around noon, patient was off the floor for wound repair. Saw patient in the afternoon.  Patient status post wound repair.  Eating lunch. He answers simple questions.  Poor historian. Denies any acute bleeding.   Review of Systems  Unable to perform ROS: Psychiatric disorder  Constitutional: Positive for fatigue.  Respiratory: Negative for cough and shortness of breath.   Cardiovascular: Negative for leg swelling.  Gastrointestinal: Negative for abdominal pain.  Skin: Positive for wound.  Psychiatric/Behavioral: Positive for depression.       Flat    Allergies  Allergen Reactions   Codeine Nausea Only   Patient questioned this (??)   Sulfa Antibiotics Hives and Nausea And Vomiting    Patient Active Problem List   Diagnosis Date Noted   Tobacco abuse 08/24/2019   Marijuana use 08/24/2019   PVD (peripheral vascular disease) ([place]) 08/24/2019   Wound dehiscence, surgical, sequela 99991111   Chronic systolic CHF (congestive heart failure) ([place]) 08/24/2019   Volume overload 08/24/2019   Hyperkalemia 99991111   Complications due to renal dialysis device, implant, and graft 08/24/2019   Seizure ([place])    Malnutrition of moderate degree 08/05/2019   Acute CHF (congestive heart failure) ([place]) 08/04/2019   Pulmonary edema 07/27/2019   Pleural effusion 07/20/2019   NSTEMI (non-ST elevated myocardial infarction) ([place]) 07/09/2019   Pneumonia 07/02/2019   Acute liver failure 05/19/2019   Hematemesis 05/19/2019   Hepatitis    Thrombocytopenia (HCC)    Coagulopathy (HCC)    Sepsis ([place]) 02/28/2019   Lobar pneumonia ([place]) 02/28/2019   Acute respiratory failure ([place]) 02/04/2019   Acute respiratory failure with hypoxemia ([place]) 01/27/2019   Depression 01/07/2019   MDD (major depressive disorder), single episode, severe , no psychosis ([place])    Homelessness    Acute respiratory failure with hypoxia ([place]) 12/25/2018   End stage renal disease on dialysis [facility]) 12/25/2018   Hypertension 12/25/2018   Renal osteodystrophy 12/25/2018     Past Medical History:  Diagnosis Date   ESRD (end stage renal disease) ([place])    Hypertension    PAD (peripheral artery disease) ([place])    Required aortofemoral stent-which had closed and had to redo the procedure and ischemia of limb.   Peripheral vascular disease ([place])    Renal disorder    Secondary hyperparathyroidism of renal origin ([place])  Past Surgical History:  Procedure Laterality Date   A/V SHUNT INTERVENTION Left 01/19/2019   Procedure: LEFT UPPER EXTREMITY A/V SHUNTOGRAM / UPPER EXTREMITY ANGIOGRAM;   Surgeon: Algernon Huxley, MD;  Location: [place] [facility];  Service: Cardiovascular;  Laterality: Left;   A/V SHUNT INTERVENTION Left 03/02/2019   Procedure: A/V SHUNT INTERVENTION;  Surgeon: Algernon Huxley, MD;  Location: [place] [facility];  Service: Cardiovascular;  Laterality: Left;   AORTA - FEMORAL ARTERY BYPASS GRAFT     AV FISTULA PLACEMENT Left 12/26/2018   Procedure: INSERTION OF GORE STRETCH VASCULAR 4-7MM X  45CM IN LEFT UPPER ARM;  Surgeon: Marty Heck, MD;  Location: [place];  Service: Vascular;  Laterality: Left;   AV FISTULA PLACEMENT Right 08/12/2019   Procedure: INSERTION OF ARTERIOVENOUS (AV) GORE-TEX GRAFT ARM;  Surgeon: Algernon Huxley, MD;  Location: [facility];  Service: Vascular;  Laterality: Right;   DIALYSIS/PERMA CATHETER REMOVAL N/A 02/06/2019   Procedure: DIALYSIS/PERMA CATHETER REMOVAL;  Surgeon: Algernon Huxley, MD;  Location: [place] [facility];  Service: Cardiovascular;  Laterality: N/A;   LEFT HEART CATH AND CORONARY ANGIOGRAPHY Right 07/10/2019   Procedure: LEFT HEART CATH AND CORONARY ANGIOGRAPHY;  Surgeon: Dionisio David, MD;  Location: [place] [facility];  Service: Cardiovascular;  Laterality: Right;   PERIPHERAL VASCULAR THROMBECTOMY Left 07/28/2019   Procedure: Left Upper Extremity Dialysis Access [place];  Surgeon: Katha Cabal, MD;  Location: [place] [facility];  Service: Cardiovascular;  Laterality: Left;   TEMPORARY DIALYSIS CATHETER N/A 08/25/2019   Procedure: TEMPORARY DIALYSIS CATHETER;  Surgeon: Katha Cabal, MD;  Location: [place] [facility];  Service: Cardiovascular;  Laterality: N/A;   UPPER EXTREMITY ANGIOGRAPHY Left 08/13/2019   Procedure: UPPER EXTREMITY ANGIOGRAPHy;  Surgeon: Algernon Huxley, MD;  Location: [place] [facility];  Service: Cardiovascular;  Laterality: Left;    Social History   Socioeconomic History   Marital status: Single    Spouse name: Not on file   Number of children: Not on file   Years of  education: Not on file   Highest education level: Not on file  Occupational History   Not on file  Social Needs   Financial resource strain: Not on file   Food insecurity    Worry: Not on file    Inability: Not on file   Transportation needs    Medical: Not on file    Non-medical: Not on file  Tobacco Use   Smoking status: Current Every Day Smoker    Packs/day: 0.50    Years: 30.00    Pack years: 15.00    Types: Cigarettes    Last attempt to quit: 06/26/2018    Years since quitting: 1.1   Smokeless tobacco: Never Used   Tobacco comment: smoked for 30 years   Substance and Sexual Activity   Alcohol use: Not Currently   Drug use: Yes    Frequency: 1.0 times per week    Types: Marijuana    Comment: Pt states "maybe once or twice a week".    Sexual activity: Not on file  Lifestyle   Physical activity    Days per week: Not on file    Minutes per session: Not on file   Stress: Not on file  Relationships   Social connections    Talks on phone: Not on file    Gets together: Not on file    Attends religious service: Not on file  Active member of club or organization: Not on file    Attends meetings of clubs or organizations: Not on file    Relationship status: Not on file   Intimate partner violence    Fear of current or ex partner: Patient refused    Emotionally abused: Patient refused    Physically abused: Patient refused    Forced sexual activity: Patient refused  Other Topics Concern   Not on file  Social History Narrative   Not on file     Family History  Problem Relation Age of Onset   Hypertension Other    Diabetes Other    Clotting disorder Father      Current Facility-Administered Medications:    amLODipine (NORVASC) tablet 5 mg, 5 mg, Oral, Daily, Dew, Jason S, MD, 5 mg at 08/26/19 1436   apixaban (ELIQUIS) tablet 2.5 mg, 2.5 mg, Oral, BID, Danford, Suann Larry, MD   carvedilol (COREG) tablet 25 mg, 25 mg, Oral, BID WC, Dew,  Erskine Squibb, MD, 25 mg at 08/26/19 1714   Chlorhexidine Gluconate Cloth 2 % PADS 6 each, 6 each, Topical, Q0600, Algernon Huxley, MD, 6 each at 08/26/19 0619   Chlorhexidine Gluconate Cloth 2 % PADS 6 each, 6 each, Topical, Q0600, Algernon Huxley, MD, 6 each at 08/26/19 0619   feeding supplement (NEPRO CARB STEADY) liquid 237 mL, 237 mL, Oral, BID BM, Dew, Erskine Squibb, MD, 237 mL at 08/25/19 1100   hydrALAZINE (APRESOLINE) injection 10 mg, 10 mg, Intravenous, Q6H PRN, Lucky Cowboy, Erskine Squibb, MD   hydrALAZINE (APRESOLINE) tablet 25 mg, 25 mg, Oral, Q8H, Dew, Jason S, MD, 25 mg at 08/27/19 0022   HYDROmorphone (DILAUDID) injection 0.5 mg, 0.5 mg, Intravenous, Q4H PRN, Algernon Huxley, MD, 0.5 mg at 08/26/19 2137   levETIRAcetam (KEPPRA) tablet 500 mg, 500 mg, Oral, BID, Dew, Erskine Squibb, MD, 500 mg at 08/26/19 2136   multivitamin (RENA-VIT) tablet 1 tablet, 1 tablet, Oral, QHS, Dew, Erskine Squibb, MD, 1 tablet at 08/26/19 2136   ondansetron (ZOFRAN) tablet 4 mg, 4 mg, Oral, Q6H PRN, 4 mg at 08/26/19 2136 **OR** ondansetron (ZOFRAN) injection 4 mg, 4 mg, Intravenous, Q6H PRN, Algernon Huxley, MD, 4 mg at 08/26/19 1127   vitamin C (ASCORBIC ACID) tablet 250 mg, 250 mg, Oral, BID, Algernon Huxley, MD, 250 mg at 08/26/19 1436   Physical exam:  Vitals:   08/26/19 1511 08/26/19 1701 08/26/19 2030 08/27/19 0518  BP: (!) 165/118 (!) 163/108 (!) 142/85 132/73  Pulse: 88 91 75 76  Resp: 16 16 18 18   Temp: 98.2 F (36.8 C)  98.4 F (36.9 C) 97.9 F (36.6 C)  TempSrc: Oral  Oral Oral  SpO2: 97% 95% 97% 90%  Weight:      Height:       Physical Exam  Constitutional:  ill appearance  HENT:  Head: Atraumatic.  Eyes: Pupils are equal, round, and reactive to light. EOM are normal. No scleral icterus.  Neck: Neck supple.  Cardiovascular: Normal rate.  Pulmonary/Chest: Effort normal.  Breathing via nasal cannula oxygen comfortably.  Abdominal: Soft. He exhibits no distension.  Musculoskeletal: Normal range of motion.    Neurological: He is alert.  Skin:  Right upper extremity wound in dressing.  Psychiatric:  Flat affect        CMP Latest Ref Rng & Units 08/27/2019  Glucose 70 - 99 mg/dL 169(H)  BUN 6 - 20 mg/dL 56(H)  Creatinine 0.61 - 1.24 mg/dL 4.62(H)  Sodium  135 - 145 mmol/L 138  Potassium 3.5 - 5.1 mmol/L 4.1  Chloride 98 - 111 mmol/L 100  CO2 22 - 32 mmol/L 24  Calcium 8.9 - 10.3 mg/dL 8.4(L)  Total Protein 6.5 - 8.1 g/dL -  Total Bilirubin 0.3 - 1.2 mg/dL -  Alkaline Phos 38 - 126 U/L -  AST 15 - 41 U/L -  ALT 0 - 44 U/L -   CBC Latest Ref Rng & Units 08/27/2019  WBC 4.0 - 10.5 K/uL 9.3  Hemoglobin 13.0 - 17.0 g/dL 7.4(L)  Hematocrit 39.0 - 52.0 % 23.3(L)  Platelets 150 - 400 K/uL 95(L)   RADIOGRAPHIC STUDIES: I have personally reviewed the radiological images as listed and agreed with the findings in the report.   Dg Abd 1 View  Result Date: 08/25/2019 CLINICAL DATA:  Hemodialysis catheter malfunction. EXAM: ABDOMEN - 1 VIEW COMPARISON:  Plain film of the abdomen dated 08/06/2019. FINDINGS: Vascular stents are stable in position/configuration. There is a new RIGHT femoral catheter with tip projected upwards to the level of the L1 vertebral body. Bowel gas pattern is nonobstructive. No evidence of soft tissue mass or abnormal fluid collection. No evidence of free intraperitoneal air seen. Surgical clips in the LEFT lower quadrant. IMPRESSION: 1. New RIGHT femoral catheter with tip projected upwards along the expected course of the IVC to the level of the L1 vertebral body. 2. Nonobstructive bowel gas pattern. Electronically Signed   By: Franki Cabot M.D.   On: 08/25/2019 14:55   Dg Abd 1 View  Result Date: 08/06/2019 CLINICAL DATA:  Abdominal distension. EXAM: ABDOMEN - 1 VIEW COMPARISON:  None. FINDINGS: The bowel gas pattern is normal. No radio-opaque calculi or other significant radiographic abnormality are seen. Vascular stent noted. Surgical clips and rectal temperature probe  are also seen. Defibrillator pad is in place. IMPRESSION: No acute finding. Electronically Signed   By: Inge Rise M.D.   On: 08/06/2019 11:16   Ct Head Wo Contrast  Result Date: 08/05/2019 CLINICAL DATA:  Cardiac arrest EXAM: CT HEAD WITHOUT CONTRAST TECHNIQUE: Contiguous axial images were obtained from the base of the skull through the vertex without intravenous contrast. COMPARISON:  None. FINDINGS: Brain: No evidence of acute infarction, hemorrhage, hydrocephalus, extra-axial collection or mass lesion/mass effect. Vascular: No hyperdense vessel or unexpected calcification. Skull: Normal. Negative for fracture or focal lesion. Sinuses/Orbits: No acute finding. IMPRESSION: Negative head CT.  No evidence of anoxic injury Electronically Signed   By: Monte Fantasia M.D.   On: 08/05/2019 06:02   Nm Pulmonary Perf And Vent  Result Date: 08/10/2019 CLINICAL DATA:  Shortness of breath EXAM: NUCLEAR MEDICINE VENTILATION - PERFUSION LUNG SCAN Views: Anterior, posterior, left lateral, right lateral, RPO, LPO, RAO, LAO-ventilation perfusion RADIOPHARMACEUTICALS:  9.9 mCi of Tc-64m DTPA aerosol inhalation and 4.34 mCi Tc40m MAA IV COMPARISON:  Chest radiograph August 10, 2019 FINDINGS: Ventilation: No focal ventilation defects are appreciable. There is cardiomegaly which causes a degree of volume loss in the left lower lobe region. Perfusion: No focal perfusion defects are evident. Note that there is cardiomegaly. IMPRESSION: No appreciable ventilation or perfusion defects. Very low probability of pulmonary embolus. Cardiomegaly noted. Electronically Signed   By: Lowella Grip III M.D.   On: 08/10/2019 13:07   US Venous Img Lower Bilateral  Result Date: 08/05/2019 CLINICAL DATA:  Bilateral lower extremity pain and edema. EXAM: BILATERAL LOWER EXTREMITY VENOUS DOPPLER ULTRASOUND TECHNIQUE: Gray-scale sonography with graded compression, as well as color Doppler and duplex ultrasound were  performed to  evaluate the lower extremity deep venous systems from the level of the common femoral vein and including the common femoral, femoral, profunda femoral, popliteal and calf veins including the posterior tibial, peroneal and gastrocnemius veins when visible. The superficial great saphenous vein was also interrogated. Spectral Doppler was utilized to evaluate flow at rest and with distal augmentation maneuvers in the common femoral, femoral and popliteal veins. COMPARISON:  None. FINDINGS: RIGHT LOWER EXTREMITY Common Femoral Vein: No evidence of thrombus. Normal compressibility, respiratory phasicity and response to augmentation. Saphenofemoral Junction: No evidence of thrombus. Normal compressibility and flow on color Doppler imaging. Profunda Femoral Vein: No evidence of thrombus. Normal compressibility and flow on color Doppler imaging. Femoral Vein: No evidence of thrombus. Normal compressibility, respiratory phasicity and response to augmentation. Popliteal Vein: No evidence of thrombus. Normal compressibility, respiratory phasicity and response to augmentation. Calf Veins: No evidence of thrombus. Normal compressibility and flow on color Doppler imaging. Superficial Great Saphenous Vein: No evidence of thrombus. Normal compressibility. Venous Reflux:  None. Other Findings: No evidence of superficial thrombophlebitis or abnormal fluid collection. LEFT LOWER EXTREMITY Common Femoral Vein: No evidence of thrombus. Normal compressibility, respiratory phasicity and response to augmentation. Saphenofemoral Junction: No evidence of thrombus. Normal compressibility and flow on color Doppler imaging. Profunda Femoral Vein: No evidence of thrombus. Normal compressibility and flow on color Doppler imaging. Femoral Vein: No evidence of thrombus. Normal compressibility, respiratory phasicity and response to augmentation. Popliteal Vein: No evidence of thrombus. Normal compressibility, respiratory phasicity and response to  augmentation. Calf Veins: No evidence of thrombus. Normal compressibility and flow on color Doppler imaging. Superficial Great Saphenous Vein: No evidence of thrombus. Normal compressibility. Venous Reflux:  None. Other Findings: No evidence of superficial thrombophlebitis or abnormal fluid collection. IMPRESSION: No evidence of deep venous thrombosis in either lower extremity. Electronically Signed   By: Aletta Edouard M.D.   On: 08/05/2019 14:00   US Venous Img Upper Uni Right  Result Date: 08/24/2019 CLINICAL DATA:  43 year old male with right upper extremity pain and swelling. Patient has a right upper extremity arteriovenous hemodialysis graft. EXAM: RIGHT UPPER EXTREMITY VENOUS DOPPLER ULTRASOUND TECHNIQUE: Gray-scale sonography with graded compression, as well as color Doppler and duplex ultrasound were performed to evaluate the upper extremity deep venous system from the level of the subclavian vein and including the jugular, axillary, basilic, radial, ulnar and upper cephalic vein. Spectral Doppler was utilized to evaluate flow at rest and with distal augmentation maneuvers. COMPARISON:  None. FINDINGS: Contralateral Subclavian Vein: Respiratory phasicity is normal and symmetric with the symptomatic side. No evidence of thrombus. Normal compressibility. Internal Jugular Vein: No evidence of thrombus. Normal compressibility, respiratory phasicity and response to augmentation. Subclavian Vein: No evidence of thrombus. Normal compressibility, respiratory phasicity and response to augmentation. Axillary Vein: No evidence of thrombus. Normal compressibility, respiratory phasicity and response to augmentation. Cephalic Vein: No evidence of thrombus. Normal compressibility, respiratory phasicity and response to augmentation. Basilic Vein: No evidence of thrombus. Normal compressibility, respiratory phasicity and response to augmentation. Brachial Veins: No evidence of thrombus. Normal compressibility,  respiratory phasicity and response to augmentation. Radial Veins: One of the paired radial veins is not compressible and demonstrates no evidence of flow on color Doppler imaging. Findings are consistent with thrombosis. Ulnar Veins: No evidence of thrombus. Normal compressibility, respiratory phasicity and response to augmentation. Venous Reflux:  None visualized. Other Findings: There is a large complex heterogeneous fluid collection in the region of the axilla surrounding the arteriovenous graft. The  collection is difficult to measure given its amorphous nature. An estimated measurement is 4.2 x 1.5 by 6.5 cm IMPRESSION: 1. Positive for acute occlusive deep venous thrombosis in 1 of the paired radial veins in the forearm. 2. Large complex fluid collection surrounding the arteriovenous graft in the axilla. Differential considerations include hematoma and abscess. 3. The arteriovenous graft remains patent. No evidence of internal thrombus. Signed, Criselda Peaches, MD, [facility] Radiology Electronically Signed   By: Jacqulynn Cadet M.D.   On: 08/24/2019 14:41   Dg Chest Port 1 View  Result Date: 08/25/2019 CLINICAL DATA:  Displacement of central venous catheter. EXAM: PORTABLE CHEST 1 VIEW COMPARISON:  08/12/2019 FINDINGS: The right-sided tunneled dialysis catheter is stable in positioning. Multiple stents are noted in the patient's left upper extremity. Heart size remains enlarged. There is vascular congestion with developing pulmonary edema. There is a moderate-sized left-sided pleural effusion with adjacent presumed atelectasis. IMPRESSION: 1. Stable positioning of the tunneled dialysis catheter. 2. Cardiomegaly with vascular congestion and possible early pulmonary edema. 3. Moderate-sized left-sided pleural effusion with adjacent compressive atelectasis. Electronically Signed   By: Constance Holster M.D.   On: 08/25/2019 05:45   Dg Chest Port 1  View  Result Date: 08/12/2019 CLINICAL DATA:  Dialysis catheter placement. End-stage renal disease. EXAM: PORTABLE CHEST 1 VIEW COMPARISON:  08/10/2019 FINDINGS: Right jugular dual-lumen central venous dialysis catheter is seen with leads overlying the distal SVC and right atrium. No evidence of pneumothorax. Stable cardiomegaly. Stable moderate left pleural effusion and left lower lung atelectasis versus infiltrate. Right lung is clear. Intravascular stents seen in the left axilla and upper arm. IMPRESSION: Right jugular dual-lumen central venous catheter in appropriate position. No pneumothorax visualized. No significant change in moderate left pleural effusion, and left lower lung atelectasis versus infiltrate. Stable cardiomegaly. Electronically Signed   By: Marlaine Hind M.D.   On: 08/12/2019 19:54   Dg Chest Port 1 View  Result Date: 08/10/2019 CLINICAL DATA:  Pt has a cough. Hx of hypertension, ESRD, perm cath. Current everyday smoker EXAM: PORTABLE CHEST 1 VIEW COMPARISON:  Radiograph 08/06/2011 FINDINGS: Large-bore central venous line unchanged. Interval extubation. Removal of NG tube. Stable enlarged cardiac silhouette. There is a moderate LEFT effusion similar prior. Central venous congestion slightly increased. Vascular stent noted in the LEFT axilla IMPRESSION: 1. Extubation without complication. 2. Increase in central venous congestion. 3. Stable LEFT pleural effusion. Electronically Signed   By: Suzy Bouchard M.D.   On: 08/10/2019 11:57   Dg Chest Port 1 View  Result Date: 08/06/2019 CLINICAL DATA:  Acute respiratory failure EXAM: PORTABLE CHEST 1 VIEW COMPARISON:  Radiograph 08/05/2019 FINDINGS: Endotracheal tube in the mid trachea, 3 cm from the carina. Transesophageal tube side port is positioned at the GE junction and should be advanced 2-3 cm for optimal function. Dual lumen right IJ approach dialysis catheter tips terminate in the lower SVC. Pacer pads overlie the chest. Cardiac  monitoring leads are present as well. Left axillary vascular stent is noted. Diffuse interstitial opacities again seen throughout the lungs. More dense opacities present in the left lung base. Small left effusion remains. No pneumothorax. IMPRESSION: 1. Endotracheal tube in the mid trachea, 3 cm from the carina. 2. Transesophageal tube side port is positioned at the GE junction and should be advanced 2-3 cm for optimal function. 3. Unchanged interstitial opacities throughout the lungs, which could reflect interstitial edema. 4. More dense opacity in the left lung base could reflect  atelectasis, pneumonia or layering effusion. Electronically Signed   By: Lovena Le M.D.   On: 08/06/2019 05:49   Dg Chest Port 1 View  Result Date: 08/05/2019 CLINICAL DATA:  Intubation EXAM: PORTABLE CHEST 1 VIEW COMPARISON:  08/03/2019, 07/27/2019, 07/21/2019 FINDINGS: Interval intubation, tip of the endotracheal tube is about 3.1 cm superior to the carina. Right-sided central venous catheter tips over the SVC and proximal right atrium. Cardiomegaly with vascular congestion and diffuse interstitial and ground-glass opacity consistent with edema. Small left effusion likely layering. Dense left lung base atelectasis or pneumonia. IMPRESSION: 1. Endotracheal tube tip about 3.1 cm superior to carina 2. Slightly improved aeration on the right. 3. Cardiomegaly with vascular congestion and pulmonary edema. Asymmetric opacity left thorax likely due to layering pleural effusion. Dense left lung base atelectasis or pneumonia Electronically Signed   By: Donavan Foil M.D.   On: 08/05/2019 04:02   Dg Chest Portable 1 View  Result Date: 08/03/2019 CLINICAL DATA:  Respiratory distress. EXAM: PORTABLE CHEST 1 VIEW COMPARISON:  October 5th 2020. FINDINGS: Stable cardiomegaly. No pneumothorax is noted. Increased bilateral perihilar and basilar opacities are noted concerning for worsening edema or possibly pneumonia. Small left pleural  effusion is noted. Stable right internal jugular dialysis catheter is noted. Bony thorax is unremarkable. IMPRESSION: Increased bilateral lung opacities are noted concerning for worsening edema or possibly pneumonia. Small left pleural effusion is noted. Electronically Signed   By: Marijo Conception M.D.   On: 08/03/2019 09:43   Dg Abd Portable 1v  Result Date: 08/05/2019 CLINICAL DATA:  Nasal/orogastric tube placement. EXAM: PORTABLE ABDOMEN - 1 VIEW COMPARISON:  CT, 07/20/2019. FINDINGS: Nasal/orogastric tube passes below the diaphragm. Tip lies in the proximal stomach. Side hole lies in the expected location of the GE junction. Normal bowel gas pattern. Vascular stents in the upper abdomen centrally. IMPRESSION: 1. Nasal/orogastric tube tip in the proximal stomach. Side hole projects at the GE junction. 2. Normal bowel gas pattern. No evidence of a generalized adynamic ileus. No evidence of a bowel obstruction. Electronically Signed   By: Lajean Manes M.D.   On: 08/05/2019 13:59   US Abdomen Limited Ruq  Result Date: 08/03/2019 CLINICAL DATA:  Right upper quadrant pain.  End-stage renal disease. EXAM: ULTRASOUND ABDOMEN LIMITED RIGHT UPPER QUADRANT COMPARISON:  07/20/2019 FINDINGS: Gallbladder: No gallstones or wall thickening visualized. No sonographic Murphy sign noted by sonographer. Common bile duct: Diameter: 4 mm Liver: No focal lesion identified. Within normal limits in parenchymal echogenicity. Portal vein is patent on color Doppler imaging with normal direction of blood flow towards the liver. Other: Bilateral pleural effusions and small to moderate volume ascites within the visualized abdomen. IMPRESSION: 1. Unremarkable sonographic appearance of the liver and gallbladder. 2. Small to moderate volume ascites. 3. Bilateral pleural effusions. Electronically Signed   By: Davina Poke M.D.   On: 08/03/2019 12:51    Assessment and plan-   # Thrombocytopenia Obtain repeat peripheral blood  smear.  Discussed with pathologists Dr. Rodney Cruise and Dr.Kraynie, patient has 1-2% schistocytes, no change compared to smear on 08/24/2019. Immature platelet fraction 7.6, inappropriately normal, indicating underproduction Normal haptoglobin level. 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 Thrombocytopenia is most likely marrow underproduction as well as consumption secondary to wound. Less likely MAHA.  Patient was transfused platelet prior to operation and platelet has increased appropriately.  Continue to monitor. If stable and counts >50,000, ok to resume home anticoagulation.   ESRD,  continue dialysis.  Nephrology on board. Wound issue is status post repair.  Vascular surgery on board.   Thank you for allowing me to participate in the care of this patient.  Total face to face encounter time for this patient visit was 70 min. >50% of the time was  spent in counseling and coordination of care.    Earlie Server, MD, PhD Hematology Oncology [facility] Pager- [phone] 08/27/2019

## 2019-08-27 NOTE — Anesthesia Postprocedure Evaluation (Signed)
Anesthesia Post Note  Patient: Brett Small  Procedure(s) Performed: REMOVAL OF A DIALYSIS CATHETER (Right )  Patient location during evaluation: PACU Anesthesia Type: General Level of consciousness: awake and alert Pain management: pain level controlled Vital Signs Assessment: post-procedure vital signs reviewed and stable Respiratory status: spontaneous breathing, nonlabored ventilation and respiratory function stable Cardiovascular status: blood pressure returned to baseline and stable Postop Assessment: no apparent nausea or vomiting Anesthetic complications: no     Last Vitals:  Vitals:   08/27/19 1255 08/27/19 1348  BP:  (!) 164/83  Pulse: 88 91  Resp: 15 15  Temp:  36.8 C  SpO2:  93%    Last Pain:  Vitals:   08/27/19 1829  TempSrc:   PainSc: [place]

## 2019-08-27 NOTE — Progress Notes (Signed)
Phar Pharmacy Antibiotic Note  Brett Small is a 43 y.o. male admitted on 08/24/2019 with wound infection.  Pharmacy has been consulted for Levofloxacin dosing. s/p right hero graft placement August 12, 2019- Wound dehiscence, surgical, sequela/Complications due to renal dialysis device, implant, and graft:   11/2 Wound cx (:Few STENOTROPHOMONAS MALTOPHILIA  Few ACINETOBACTER BAUMANNII   *Patient with Sulfa Allergy *  Plan: -Hemodialysis pt - currently T,T,S Will order Levaquin 500mg  PO x 1 dose after HD today 11/5 and continue Levaquin 250 mg PO Q48h to be given after HD -F/u sensitivities of organisms    Height: 5\' 1"  (154.9 cm) Weight: 105 lb (47.6 kg) IBW/kg (Calculated) : 52.3  Temp (24hrs), Avg:98 F (36.7 C), Min:97.5 F (36.4 C), Max:98.4 F (36.9 C)  Recent Labs  Lab 08/24/19 1218 08/25/19 0251 08/25/19 1455 08/26/19 0426 08/26/19 1506 08/27/19 0456  WBC 12.4* 8.3 10.9* 6.6 7.4 9.3  CREATININE 8.14* 7.83*  --  3.92*  --  4.62*    Estimated Creatinine Clearance: 13.6 mL/min (A) (by C-G formula based on SCr of 4.62 mg/dL (H)).    Allergies  Allergen Reactions  . Codeine Nausea Only    Patient questioned this (??)  . Sulfa Antibiotics Hives and Nausea And Vomiting    Antimicrobials this admission: Levaquin 11/5 >>     Dose adjustments this admission:    Microbiology results:   BCx:     UCx:      Sputum:      MRSA PCR:   Wound cx 11/2- FEW STENOTROPHOMONAS MALTOPHILIA  FEW ACINETOBACTER BAUMANNII  Thank you for allowing pharmacy to be a part of this patient's care.  Gentry Seeber A 08/27/2019 3:20 PM

## 2019-08-27 NOTE — Progress Notes (Signed)
PT Cancellation Note  Patient Details Name: Cluster Acre MRN: [id number] DOB: [birth year]   Cancelled Treatment:    Reason Eval/Treat Not Completed: Patient at procedure or test/unavailable(Consult received and chart reviewed.  Patient currently off unit at dialysis session. Will re-attempt at later time/date as medically appropriate and available.)   Doranne Schmutz H. Owens Shark, PT, DPT, NCS 08/27/19, 9:36 AM [phone]

## 2019-08-27 NOTE — Progress Notes (Signed)
[facility] Daily Progress Note   Subjective: 1 Day Post-Op: 1.   Excision of entire right brachial artery to axillary vein AV graft 2.   Repair of right brachial artery with CorMatrix patch angioplasty  Seen after dialysis. No complaints. No issues overnight.   Objective: Vitals:   08/27/19 1230 08/27/19 1245 08/27/19 1255 08/27/19 1348  BP: 133/74   (!) 164/83  Pulse: 85  88 91  Resp: 13  15 15   Temp:  98.1 F (36.7 C)  98.3 F (36.8 C)  TempSrc:    Oral  SpO2:    93%  Weight:      Height:        Intake/Output Summary (Last 24 hours) at 08/27/2019 1403 Last data filed at 08/27/2019 1245 Gross per 24 hour  Intake 680 ml  Output 1500 ml  Net -820 ml   Physical Exam: A&Ox3, NAD Chest: Right IJ permcath intact, clean and dry CV: RRR Pulmonary: CTA Bilaterally Abdomen: Soft, Nontender, Nondistended Vascular:  Right Upper Extremity: OR dressing removed. Proximal and distal incision. Clean , dry and intact. Hand warm. Motor / sensory intact.    Laboratory: CBC    Component Value Date/Time   WBC 9.3 08/27/2019 0456   HGB 7.4 (L) 08/27/2019 0456   HCT 23.3 (L) 08/27/2019 0456   PLT 95 (L) 08/27/2019 0456   BMET    Component Value Date/Time   NA 138 08/27/2019 0456   K 4.1 08/27/2019 0456   CL 100 08/27/2019 0456   CO2 24 08/27/2019 0456   GLUCOSE 169 (H) 08/27/2019 0456   BUN 56 (H) 08/27/2019 0456   CREATININE 4.62 (H) 08/27/2019 0456   CALCIUM 8.4 (L) 08/27/2019 0456   GFRNONAA 14 (L) 08/27/2019 0456   GFRAA 16 (L) 08/27/2019 0456   Assessment/Planning: The patient is a 43 year old male with multiple medical issues including end-stage renal disease on chronic hemodialysis who presented with wound dehiscence and infected right brachial axillary graft now status post excision/wound washout - POD#1 1) OR dressing removed.  Both proximal and distal incisions are closed and intact.  Clean and dry. 2) right upper extremity redressed with  Xeroform to incisions, covered with ABD, cover with Kerlix, cover with Ace. 3) I had a long discussion with the patient in regard to keeping his arm in the sling at all times to avoid extension of the extremity and possibly wound dehiscence again.  Recommend to keep arm in sling for approximately 1 week we will see the patient in 1 week for his first postop incision check.  Discussed with Dr. Ellis Parents Larance Ratledge PA-C 08/27/2019 2:03 PM

## 2019-08-27 NOTE — Discharge Instructions (Signed)
Vascular Surgery Discharge Instructions 1) Please keep you right hand in sling until your first follow up in one week  2) Keep incisions clean and dry. 3) Daily dressing change: xeroform to both incisions. Covered with dry gauze. Covered with kerlix. Covered with ACE bandage.

## 2019-08-28 LAB — BPAM PLATELET PHERESIS
Blood Product Expiration Date: 202011082359
Unit Type and Rh: 6200

## 2019-08-28 LAB — CBC
HCT: 23.5 % — ABNORMAL LOW (ref 39.0–52.0)
Hemoglobin: 7 g/dL — ABNORMAL LOW (ref 13.0–17.0)
MCH: 31.5 pg (ref 26.0–34.0)
MCHC: 29.8 g/dL — ABNORMAL LOW (ref 30.0–36.0)
MCV: 105.9 fL — ABNORMAL HIGH (ref 80.0–100.0)
Platelets: 80 10*3/uL — ABNORMAL LOW (ref 150–400)
RBC: 2.22 MIL/uL — ABNORMAL LOW (ref 4.22–5.81)
RDW: 20.2 % — ABNORMAL HIGH (ref 11.5–15.5)
WBC: 8.4 10*3/uL (ref 4.0–10.5)
nRBC: 0.2 % (ref 0.0–0.2)

## 2019-08-28 LAB — IMMATURE PLATELET FRACTION: Immature Platelet Fraction: 3.7 % (ref 1.2–8.6)

## 2019-08-28 LAB — COMPREHENSIVE METABOLIC PANEL
ALT: 96 U/L — ABNORMAL HIGH (ref 0–44)
AST: 56 U/L — ABNORMAL HIGH (ref 15–41)
Albumin: 2.4 g/dL — ABNORMAL LOW (ref 3.5–5.0)
Alkaline Phosphatase: 88 U/L (ref 38–126)
Anion gap: 12 (ref 5–15)
BUN: 28 mg/dL — ABNORMAL HIGH (ref 6–20)
CO2: 28 mmol/L (ref 22–32)
Calcium: 7.5 mg/dL — ABNORMAL LOW (ref 8.9–10.3)
Chloride: 100 mmol/L (ref 98–111)
Creatinine, Ser: 2.6 mg/dL — ABNORMAL HIGH (ref 0.61–1.24)
GFR calc Af Amer: 33 mL/min — ABNORMAL LOW (ref >60)
GFR calc non Af Amer: 29 mL/min — ABNORMAL LOW (ref >60)
Glucose, Bld: 117 mg/dL — ABNORMAL HIGH (ref 70–99)
Potassium: 3.5 mmol/L (ref 3.5–5.1)
Sodium: 140 mmol/L (ref 135–145)
Total Bilirubin: 1.9 mg/dL — ABNORMAL HIGH (ref 0.3–1.2)
Total Protein: 4.9 g/dL — ABNORMAL LOW (ref 6.5–8.1)

## 2019-08-28 LAB — PREPARE PLATELET PHERESIS: Unit division: 0

## 2019-08-28 LAB — PREPARE RBC (CROSSMATCH)

## 2019-08-28 LAB — RETIC PANEL
Immature Retic Fract: 29.1 % — ABNORMAL HIGH (ref 2.3–15.9)
RBC.: 2.23 MIL/uL — ABNORMAL LOW (ref 4.22–5.81)
Retic Count, Absolute: 107.7 10*3/uL (ref 19.0–186.0)
Retic Ct Pct: 4.8 % — ABNORMAL HIGH (ref 0.4–3.1)
Reticulocyte Hemoglobin: 25.5 pg — ABNORMAL LOW (ref >27.9)

## 2019-08-28 LAB — IRON AND TIBC
Iron: 27 ug/dL — ABNORMAL LOW (ref 45–182)
Saturation Ratios: 12 % — ABNORMAL LOW (ref 17.9–39.5)
TIBC: 233 ug/dL — ABNORMAL LOW (ref 250–450)
UIBC: 206 ug/dL

## 2019-08-28 LAB — FERRITIN: Ferritin: 222 ng/mL (ref 24–336)

## 2019-08-28 MED ORDER — ALUM & MAG HYDROXIDE-SIMETH 200-200-20 MG/5ML PO SUSP: 30.0000 mL | ORAL | Status: DC | PRN

## 2019-08-28 MED ORDER — OXYCODONE HCL 5 MG PO TABS: 5.0000 mg | ORAL_TABLET | Freq: Four times a day (QID) | ORAL | 0 refills | Status: DC | PRN

## 2019-08-28 MED ORDER — LEVOFLOXACIN 500 MG PO TABS: 500.0000 mg | ORAL_TABLET | ORAL | 0 refills | Status: DC

## 2019-08-28 MED ORDER — SODIUM CHLORIDE 0.9% IV SOLUTION
Freq: Once | INTRAVENOUS | Status: AC
  Administered 2019-08-28: 13:00:00 via INTRAVENOUS

## 2019-08-28 MED ORDER — LEVOFLOXACIN 500 MG PO TABS: 500.0000 mg | ORAL_TABLET | ORAL | Status: DC

## 2019-08-28 NOTE — Progress Notes (Signed)
[facility] Daily Progress Note   Subjective: 2 Days Post-Op 1.Excision of entire right brachial artery to axillary vein AV graft 2.Repair of right brachial artery with CorMatrix patch angioplasty  Patient without complaint. No issues overnight.   Objective: Vitals:   08/27/19 1348 08/27/19 2045 08/28/19 0429 08/28/19 0839  BP: (!) 164/83 131/80 119/88 (!) 144/87  Pulse: 91 80 78 78  Resp: 15 20 20    Temp: 98.3 F (36.8 C) 98.2 F (36.8 C) 98.5 F (36.9 C) (!) 97.5 F (36.4 C)  TempSrc: Oral Oral Oral Oral  SpO2: 93% 95% 93% 96%  Weight:      Height:        Intake/Output Summary (Last 24 hours) at 08/28/2019 1119 Last data filed at 08/28/2019 0900 Gross per 24 hour  Intake 490 ml  Output 1500 ml  Net -1010 ml   Physical Exam: A&Ox3, NAD Chest: Right IJ permcath intact, clean and dry CV: RRR Pulmonary: CTA Bilaterally Abdomen: Soft, Nontender, Nondistended Vascular:             Right Upper Extremity: Proximal and distal incision. Clean , dry and intact. Hand warm. Motor / sensory intact.      Document Information Photos    08/28/2019 10:18  Attached To:  Hospital Encounter on 08/24/19  Source Information Leonides Schanz  [facility]    Laboratory: CBC    Component Value Date/Time   WBC 8.4 08/28/2019 0610   HGB 7.0 (L) 08/28/2019 0610   HCT 23.5 (L) 08/28/2019 0610   PLT 80 (L) 08/28/2019 0610   BMET    Component Value Date/Time   NA 140 08/28/2019 0610   K 3.5 08/28/2019 0610   CL 100 08/28/2019 0610   CO2 28 08/28/2019 0610   GLUCOSE 117 (H) 08/28/2019 0610   BUN 28 (H) 08/28/2019 0610   CREATININE 2.60 (H) 08/28/2019 0610   CALCIUM 7.5 (L) 08/28/2019 0610   GFRNONAA 29 (L) 08/28/2019 0610   GFRAA 33 (L) 08/28/2019 0610   Assessment/Planning: The patient is a 43 year old male with multiple medical issues including end-stage renal disease on chronic hemodialysis who presented with wound  dehiscence and infected right brachial axillary graft now s/p excision/wound washout - POD#2 1) Both proximal and distal incisions are closed and intact.  Clean and dry. 2) right upper extremity redressed with Xeroform to incisions, covered with ABD, cover with Kerlix, cover with Ace. 3) I had a long discussion with the patient in regard to keeping his arm in the sling at all times to avoid extension of the extremity and possibly wound dehiscence again.  Recommend to keep arm in sling for approximately 1 week we will see the patient in 1 week for his first postop incision check.  Discussed with Dr. Ellis Parents Mistina Coatney PA-C 08/28/2019 11:19 AM

## 2019-08-28 NOTE — Progress Notes (Signed)
[facility]  ROUNDING NOTE   Subjective:  Patient completed dialysis yesterday. Resting comfortably in bed at the moment.   Objective:  Vital signs in last 24 hours:  Temp:  [97.5 F (36.4 C)-98.5 F (36.9 C)] 97.5 F (36.4 C) (11/06 0839) Pulse Rate:  [52-91] 78 (11/06 0839) Resp:  [11-20] 20 (11/06 0429) BP: (118-164)/(70-134) 144/87 (11/06 0839) SpO2:  [93 %-96 %] 96 % (11/06 0839)  Weight change:  Filed Weights   08/24/19 1153 08/24/19 1807 08/26/19 0921  Weight: 47.6 kg 48.2 kg 47.6 kg    Intake/Output: I/O last 3 completed shifts: In: 74 [P.O.:480; IV Piggyback:10] Out: 1500 [Other:1500]   Intake/Output this shift:  No intake/output data recorded.  Physical Exam: General: Chronically ill-appearing  Head: Normocephalic, atraumatic. Moist oral mucosal membranes  Eyes: Anicteric  Neck: Supple, trachea midline  Lungs:  Clear to auscultation, normal effort  Heart: S1S2 no rubs  Abdomen:  Soft, nontender, bowel sounds present  Extremities: Trace peripheral edema.  Neurologic: Awake, alert, following commands  Skin: No lesions  Access: R IJ permcath, cuff right at exit site, right femoral dialysis catheter.    Basic Metabolic Panel: Recent Labs  Lab 08/24/19 1218 08/25/19 0251 08/25/19 1155 08/26/19 0426 08/27/19 0456 08/27/19 0933 08/28/19 0610  NA 133* 139  --  138 138  --  140  K 6.1* 5.4*  --  3.4* 4.1  --  3.5  CL 93* 98  --  102 100  --  100  CO2 13* 23  --  24 24  --  28  GLUCOSE 129* 113*  --  135* 169*  --  117*  BUN 124* 128*  --  47* 56*  --  28*  CREATININE 8.14* 7.83*  --  3.92* 4.62*  --  2.60*  CALCIUM 9.0 8.5*  --  8.0* 8.4*  --  7.5*  MG  --  2.5*  --  1.9 2.0  --   --   PHOS  --   --  6.3*  --   --  4.0  --     Liver Function Tests: Recent Labs  Lab 08/24/19 1218 08/26/19 0426 08/27/19 0933 08/28/19 0610  AST 475* 162* 79* 56*  ALT 515* 322* 168* 96*  ALKPHOS 137* 107 106 88  BILITOT 3.2* 2.5* 1.9* 1.9*   PROT 6.3* 5.2* 5.5* 4.9*  ALBUMIN 3.3* 2.7* 2.7* 2.4*   No results for input(s): LIPASE, AMYLASE in the last 168 hours. No results for input(s): AMMONIA in the last 168 hours.  CBC: Recent Labs  Lab 08/24/19 1218  08/25/19 1455 08/26/19 0426 08/26/19 1506 08/27/19 0456 08/28/19 0610  WBC 12.4*   < > 10.9* 6.6 7.4 9.3 8.4  NEUTROABS 10.2*  --   --   --   --   --   --   HGB 10.1*   < > 9.2* 8.2* 8.4* 7.4* 7.0*  HCT 32.1*   < > 28.7* 25.9* 26.5* 23.3* 23.5*  MCV 103.2*   < > 101.1* 100.4* 102.3* 100.0 105.9*  PLT 51*   < > 35* 29* 108* 95* 80*   < > = values in this interval not displayed.    Cardiac Enzymes: No results for input(s): CKTOTAL, CKMB, CKMBINDEX, TROPONINI in the last 168 hours.  BNP: Invalid input(s): POCBNP  CBG: Recent Labs  Lab 08/24/19 1806  GLUCAP 53    Microbiology: Results for orders placed or performed during the hospital encounter of 08/24/19  Aerobic/Anaerobic  Culture (surgical/deep wound)     Status: None (Preliminary result)   Collection Time: 08/24/19 12:22 PM   Specimen: Wound  Result Value Ref Range Status   Specimen Description   Final    WOUND Performed at [facility], [address], [place], [place] [address]    Special Requests   Final    Normal Performed at [facility], [address] [place], [place] [address]    Gram Stain   Final    RARE WBC PRESENT, PREDOMINANTLY PMN RARE GRAM POSITIVE COCCI IN PAIRS Performed at [facility], [employer] [address], [place], [place] [address]    Culture   Final    FEW STENOTROPHOMONAS MALTOPHILIA FEW ACINETOBACTER BAUMANNII SUSCEPTIBILITIES TO FOLLOW NO ANAEROBES ISOLATED; CULTURE IN PROGRESS FOR 5 DAYS    Report Status PENDING  Incomplete  SARS Coronavirus 2 by RT PCR (hospital order, performed in [facility] hospital lab) Nasopharyngeal Nasopharyngeal Swab     Status: None   Collection Time: 08/24/19  1:37 PM   Specimen: Nasopharyngeal Swab  Result Value  Ref Range Status   SARS Coronavirus 2 NEGATIVE NEGATIVE Final    Comment: (NOTE) If result is NEGATIVE SARS-CoV-2 target nucleic acids are NOT DETECTED. The SARS-CoV-2 RNA is generally detectable in upper and lower  respiratory specimens during the acute phase of infection. The lowest  concentration of SARS-CoV-2 viral copies this assay can detect is 250  copies / mL. A negative result does not preclude SARS-CoV-2 infection  and should not be used as the sole basis for treatment or other  patient management decisions.  A negative result may occur with  improper specimen collection / handling, submission of specimen other  than nasopharyngeal swab, presence of viral mutation(s) within the  areas targeted by this assay, and inadequate number of viral copies  (<250 copies / mL). A negative result must be combined with clinical  observations, patient history, and epidemiological information. If result is POSITIVE SARS-CoV-2 target nucleic acids are DETECTED. The SARS-CoV-2 RNA is generally detectable in upper and lower  respiratory specimens dur ing the acute phase of infection.  Positive  results are indicative of active infection with SARS-CoV-2.  Clinical  correlation with patient history and other diagnostic information is  necessary to determine patient infection status.  Positive results do  not rule out bacterial infection or co-infection with other viruses. If result is PRESUMPTIVE POSTIVE SARS-CoV-2 nucleic acids MAY BE PRESENT.   A presumptive positive result was obtained on the submitted specimen  and confirmed on repeat testing.  While 2019 novel coronavirus  (SARS-CoV-2) nucleic acids may be present in the submitted sample  additional confirmatory testing may be necessary for epidemiological  and / or clinical management purposes  to differentiate between  SARS-CoV-2 and other Sarbecovirus currently known to infect humans.  If clinically indicated additional testing with an  alternate test  methodology [phone]) is advised. The SARS-CoV-2 RNA is generally  detectable in upper and lower respiratory sp ecimens during the acute  phase of infection. The expected result is Negative. Fact Sheet for Patients:  StrictlyIdeas.no Fact Sheet for Healthcare Providers: BankingDealers.co.za This test is not yet approved or cleared by the [place] FDA and has been authorized for detection and/or diagnosis of SARS-CoV-2 by FDA under an Emergency Use Authorization (EUA).  This EUA will remain in effect (meaning this test can be used) for the duration of the COVID-19 declaration under Section 564(b)(1) of the Act, 21 U.S.C. section  360bbb-3(b)(1), unless the authorization is terminated or revoked sooner. Performed at [facility], [address] [place], [place] [address]     Coagulation Studies: Recent Labs    08/25/19 1455 08/26/19 0426 08/27/19 1204  LABPROT 22.1* 19.0* 15.0  INR 2.0* 1.6* 1.2    Urinalysis: No results for input(s): COLORURINE, LABSPEC, PHURINE, GLUCOSEU, HGBUR, BILIRUBINUR, KETONESUR, PROTEINUR, UROBILINOGEN, NITRITE, LEUKOCYTESUR in the last 72 hours.  Invalid input(s): APPERANCEUR    Imaging: No results found.   Medications:   . meropenem (MERREM) IV 500 mg (08/27/19 1834)   . amLODipine  5 mg Oral Daily  . apixaban  2.5 mg Oral BID  . carvedilol  25 mg Oral BID WC  . Chlorhexidine Gluconate Cloth  6 each Topical Q0600  . Chlorhexidine Gluconate Cloth  6 each Topical Q0600  . epoetin (EPOGEN/PROCRIT) injection  10,000 Units Intravenous Q T,Th,Sa-HD  . feeding supplement (NEPRO CARB STEADY)  237 mL Oral BID BM  . hydrALAZINE  25 mg Oral Q8H  . levETIRAcetam  500 mg Oral BID  . [START ON 08/29/2019] levofloxacin  250 mg Oral Q48H  . multivitamin  1 tablet Oral QHS  . vitamin C  250 mg Oral BID   hydrALAZINE, ondansetron **OR** ondansetron (ZOFRAN) IV,  oxyCODONE  Assessment/ Plan:  43 y.o. male with end-stage renal disease on hemodialysis, hypertension, peripheral vascular disease, depression, PEA event 10/20.  [place] TTSLeft AVG  1.  ESRD on HD TTS.  Patient has missed last 2 dialysis sessions as an outpatient.  -Temporary femoral catheter was not removed yesterday.  This was discussed with the nursing today and they will remove this today.  No urgent indication for dialysis today.  We will plan for dialysis again tomorrow if still here otherwise he will plan to have dialysis as an outpatient tomorrow.  2.  Hyperkalemia.  Resolved, potassium currently 3.5.  3.  Anemia chronic kidney disease.  Hemoglobin down a bit further to 7.0.  Consider blood transfusion but defer decision to hospitalist.  4.  Secondary hyperparathyroidism.  Phosphorus 4.0 and at target.  5.  Wound dehiscence at left upper extremity AV access site.  Patient's AV graft was removed.  Appreciate vascular surgery assistance..   LOS: 4 Brett Small 11/6/20209:34 AM

## 2019-08-28 NOTE — TOC Initial Note (Addendum)
Transition of Care [facility]) - Initial/Assessment Note    Patient Details  Name: Brett Small MRN: [id number] Date of Birth: 06/19/1976  Transition of Care [facility]) CM/SW Contact:    Beverly Sessions, RN Phone Number: 08/28/2019, 5:16 PM  Clinical Narrative:                 Patient admitted from boarding house  Friend Ken from [place] to pick up today for discharge  When asked why patient did not go to dialysis he states "PT was there so I decided not to go"  RNCM reinforced the importance of going to scheduled outpatient HD so he does not become fluid overloaded and have to come back to hospital.  Discussed with home health agency as well   Patient uses [facility] to get to HD and appointments.  There is no cost for patient to use this services  Patient to discharge with resumption of home health services through [facility].  RNCM requested for RN orders at discharge. These were not placed.  Home health to follow up and add if indicated.    Malachy Mood with [facility] notified of discharge.   Referral for platform walker made to [facility] with [place]   Expected Discharge Plan: [place] Barriers to Discharge: Barriers Resolved   Patient Goals and CMS Choice        Expected Discharge Plan and Services Expected Discharge Plan: [place]   Discharge Planning Services: CM Consult   Living arrangements for the past 2 months: [place] Expected Discharge Date: 08/28/19                         HH Arranged: RN, PT, OT, Social Work [employer] Agency: [place] Date [facility] Agency Contacted: 08/28/19   Representative spoke with at [place]  Prior Living Arrangements/Services Living arrangements for the past 2 months: [place] with:: Facility Resident Patient language and need for interpreter reviewed:: Yes Do you feel safe going back to the place where you live?: Yes      Need for Family  Participation in Patient Care: Yes (Comment) Care giver support system in place?: Yes (comment)   Criminal Activity/Legal Involvement Pertinent to Current Situation/Hospitalization: No - Comment as needed  Activities of Daily Living Home Assistive Devices/Equipment: Hospital bed, Grab bars around toilet ADL Screening (condition at time of admission) Patient's cognitive ability adequate to safely complete daily activities?: Yes Is the patient deaf or have difficulty hearing?: No Does the patient have difficulty seeing, even when wearing glasses/contacts?: No Does the patient have difficulty concentrating, remembering, or making decisions?: No Patient able to express need for assistance with ADLs?: Yes Does the patient have difficulty dressing or bathing?: No Independently performs ADLs?: Yes (appropriate for developmental age) Does the patient have difficulty walking or climbing stairs?: Yes Weakness of Legs: Both Weakness of Arms/Hands: Both  Permission Sought/Granted                  Emotional Assessment Appearance:: Appears older than stated age Attitude/Demeanor/Rapport: Gracious   Orientation: : Oriented to Self, Oriented to Place, Oriented to  Time, Oriented to Situation   Psych Involvement: No (comment)  Admission diagnosis:  Generalized edema [R60.1] Pain [R52] End stage renal disease on dialysis ([place]) [N18.6, Z99.2] Wound dehiscence [T81.30XA] Patient Active Problem List   Diagnosis Date Noted  . Tobacco abuse 08/24/2019  . Marijuana use  08/24/2019  . PVD (peripheral vascular disease) ([place]) 08/24/2019  . Wound dehiscence, surgical, sequela 08/24/2019  . Chronic systolic CHF (congestive heart failure) ([place]) 08/24/2019  . Volume overload 08/24/2019  . Hyperkalemia 08/24/2019  . Complications due to renal dialysis device, implant, and graft 08/24/2019  . Seizure ([place])   . Malnutrition of moderate degree 08/05/2019  . Acute CHF (congestive heart failure) ([place])  08/04/2019  . Pulmonary edema 07/27/2019  . Pleural effusion 07/20/2019  . NSTEMI (non-ST elevated myocardial infarction) ([place]) 07/09/2019  . Pneumonia 07/02/2019  . Acute liver failure 05/19/2019  . Hematemesis 05/19/2019  . Hepatitis   . Thrombocytopenia ([place])   . Coagulopathy ([place])   . Sepsis ([place]) 02/28/2019  . Lobar pneumonia ([place]) 02/28/2019  . Acute respiratory failure ([place]) 02/04/2019  . Acute respiratory failure with hypoxemia ([place]) 01/27/2019  . Depression 01/07/2019  . MDD (major depressive disorder), single episode, severe , no psychosis ([place])   . Homelessness   . Acute respiratory failure with hypoxia ([place]) 12/25/2018  . End stage renal disease on dialysis ([place]) 12/25/2018  . Hypertension 12/25/2018  . Renal osteodystrophy 12/25/2018   PCP:  Patient, [name] Pharmacy:   [facility], [place] [address] [place] [address] Phone: [phone] Fax: [phone]  [facility] [place], [place] - [place] [address] Phone: [phone] Fax: [phone]  [place], [place] - [place] [address] Phone: [phone] Fax: [phone]     Social Determinants of Health (SDOH) Interventions    Readmission Risk Interventions Readmission Risk Prevention Plan 08/28/2019 08/10/2019 07/28/2019  Transportation Screening Complete Complete Complete  Medication Review Press photographer) Complete Complete Complete  PCP or Specialist appointment within 3-5 days of discharge (No Data) - Complete  PCP/Specialist Appt Not Complete comments - - -  [place] or Home Care Consult Complete Complete Complete  SW Recovery Care/Counseling Consult - Complete (No Data)  Palliative Care Screening - Patient Refused Not Comern­o Not Applicable - Not Applicable

## 2019-08-28 NOTE — Progress Notes (Signed)
Phar Pharmacy Antibiotic Note  Brett Small is a 43 y.o. male admitted on 08/24/2019 with wound infection.  Pharmacy has been consulted for Levofloxacin dosing. s/p right hero graft placement August 12, 2019- Wound dehiscence, surgical, sequela/Complications due to renal dialysis device, implant, and graft:   11/2 Wound cx (:Few STENOTROPHOMONAS MALTOPHILIA  Few ACINETOBACTER BAUMANNII   *Patient with Sulfa Allergy *  Plan: -Hemodialysis pt - currently T,T,S Will order Levaquin 500mg  PO x 1 dose after HD today 11/5 and continue Levaquin 250 mg PO Q48h to be given after HD -Both bacteria susceptible to Levaquin. Will continue with current regimen.    Height: 5\' 1"  (154.9 cm) Weight: 105 lb (47.6 kg) IBW/kg (Calculated) : 52.3  Temp (24hrs), Avg:98.1 F (36.7 C), Min:97.5 F (36.4 C), Max:98.5 F (36.9 C)  Recent Labs  Lab 08/24/19 1218 08/25/19 0251 08/25/19 1455 08/26/19 0426 08/26/19 1506 08/27/19 0456 08/28/19 0610  WBC 12.4* 8.3 10.9* 6.6 7.4 9.3 8.4  CREATININE 8.14* 7.83*  --  3.92*  --  4.62* 2.60*    Estimated Creatinine Clearance: 24.2 mL/min (A) (by C-G formula based on SCr of 2.6 mg/dL (H)).    Allergies  Allergen Reactions  . Codeine Nausea Only    Patient questioned this (??)  . Sulfa Antibiotics Hives and Nausea And Vomiting    Antimicrobials this admission: Levaquin 11/5 >>     Dose adjustments this admission:    Microbiology results:   BCx:  Pending    UCx:      Sputum:      MRSA PCR:   Wound cx 11/2- FEW STENOTROPHOMONAS MALTOPHILIA  FEW ACINETOBACTER BAUMANNII  Thank you for allowing pharmacy to be a part of this patient's care.  Rawson Minix A Kimi Bordeau 08/28/2019 3:10 PM

## 2019-08-28 NOTE — Discharge Summary (Signed)
Physician Discharge Summary  Brett Small [id number] DOB: [birth year] DOA: 08/24/2019  PCP: Brett Small  Admit date: 08/24/2019 Discharge date: 08/28/2019  Admitted From: Home Disposition:  Home   Recommendations for Outpatient Follow-up:  1. Follow up with vascular surgery in 1 week 2. Follow-up with [facility], Brett Small in 1 to 2 months 3. Follow-up with Brett Small, hematology in 1 to 2 weeks 4. Please obtain CBC in 1 week to follow-up hemoglobin, platelets 5. Please obtain LFTs in 3 to 4 weeks       Home Health: PT/OT/aide/SW, the patient has had a recent right upper arm surgery and remains significantly limited in his ability to perform ADLs and mobility as result of pain from the surgery Equipment/Devices: Rolling walker with platform attachment  Discharge Condition: Fair CODE STATUS: Full code Diet recommendation: Renal  Brief/Interim Summary: Brett Small is a 43 y.o. M with ESRD, HTN, PVD s/p aortofem bypass, depression and recent PEA arrest who presented with 1 week draining from right arm wound and missed HD.     In the ER, K 6.1, WBC 12K.  Vascular surgery consulted and noted plan for wound repair on hospital day 2.       PRINCIPAL HOSPITAL DIAGNOSIS: Wound infection with dehiscence    Discharge Diagnoses:    Wound dehiscence S/p excision of RIGHT arm AV graft and brachial artery repair with patch angioplasty 11/4 by Brett Small Patient admitted and had right femoral catheter placed (due to partial dislodgement of right chest tunneled catheter)  Next day went to OR with [facility] for excision of RUE AV graft and primary closure.     Subsequently, surface cultures from admission grew Acinetobacter and stenotrophomonas.  ID were consulted and he was started on meropenem and Levaquin.  Cultures showed sensitivity to Levaquin, and given low suspicion for infection by vascular surgery, the patient was discharged to complete 2 weeks of Levaquin (given every other day  after dialysis). Has close vascular surgery follow-up     Thrombocytopenia This appears to be a recurrence of his episode from July, which was diagnosed as thrombocytopenia from acute liver injury.  It was self-limited and had completely resolved in the interim between July and now.  On arrival here, his platelets were 35K.  There was very low clinical suspicion for TTP, case discussed with hematology, who personally evaluated the patient.    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  Reports to me that he has used acetaminophen prior to admission.  Transfused 2 packs pre-op 11/4, Plts up to 100K, trending down at the time of admission.  No clinical bleeding or oozing during stay, even after resuming Eliquis post-op.    Acute blood loss anemia of chronic renal disease Hgb down to 7 g/dL on day of discharge, given 1 unit PRBCs transfusion.   Transaminitis Same as July.  Transaminases continue to improve.  INR normal.   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  INR 2 on admission, improving yesterday.   US liver 2 weeks PTA showed normal sonogrpahic appearance of liver, small ascites.  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   Questionable seizure disorder           Discharge Instructions  Discharge Instructions    Diet - low sodium heart healthy   Complete by: As directed    Discharge instructions   Complete by: As directed    From Brett Small: You were admitted because your AV graft wound was opening and the  graft was coming out. The graft has been removed, the wound sewed up and this should be all better.  In the meantime, while you were here, we noticed that you had a liver injury and low platelets (these are your blood cells that help you clot)   Thankfully, these numbers are looking better, so whatever caused it looks like it is getting better.   For the arm surgery: Take oxycodone for pain, take 2.5 mg (1/2 tab) up to 3 times Small day until it heals Brett Small office has made you an appointment with Brett Small in 1 week for follow up, make sure you attend this appointment. Keep your arm in a sling at all times. Care for the wound as you were instructed by Vascular surgery If you are unsure how to care for the wound, call their office  For the infection:  Take levofloxacin 500 mg after every dialysis session (starting tomorrow, Saturday) Take with a probiotic Ask the pharmacist for help picking a probiotic, any one will do     For the liver and blood problems: See the hematologist (blood doctor) Brett Small in 2-4 weeks (this appointment is listed below in the To Do section)   I recommend you follow up with a liver specialist.  You saw Brett Small at [facility] in July.  His office number is listed below.  Call them, let them know you saw Brett Small at [facility] in July/August, and you never scheduled a follow up appointment but you need one now.  In the meantime DO NOT TAKE ACETAMINOPHEN/TYLENOL   For your blood pressure: STOP taking aspirin for now You may continue Eliquis and all your other home medicines  START taking amlodipine 5 mg daily (this is a blood pressure medicine) See your primary care doctor in 1 week for blood pressure check and have them check your liver function tests and blood counts   Increase activity slowly   Complete by: As directed    Increase activity slowly   Complete by: As directed      Allergies as of 08/28/2019      Reactions   Codeine Nausea Only   Patient  questioned this (??)   Sulfa Antibiotics Hives, Nausea And Vomiting      Medication List    STOP taking these medications   aspirin EC 81 MG tablet   cephALEXin 500 MG capsule Commonly known as: KEFLEX     TAKE these medications   amLODipine 5 MG tablet Commonly known as: NORVASC Take 1 tablet (5 mg total) by mouth daily.   apixaban 2.5 MG Tabs tablet Commonly known as: ELIQUIS Take 1 tablet (2.5 mg total) by mouth 2 (two) times daily.   atorvastatin 80 MG tablet Commonly known as: LIPITOR Take 1 tablet (80 mg total) by mouth at bedtime.   carvedilol 25 MG tablet Commonly known as: COREG Take 1 tablet (25 mg total) by mouth 2 (two) times daily with a meal.   feeding supplement (NEPRO CARB STEADY) Liqd Take 237 mLs by mouth 2 (two) times daily between meals.  gabapentin 100 MG capsule Commonly known as: NEURONTIN Take 1 capsule (100 mg total) by mouth 3 (three) times daily.   hydrALAZINE 25 MG tablet Commonly known as: APRESOLINE Take 1 tablet (25 mg total) by mouth every 8 (eight) hours.   hydrOXYzine 25 MG tablet Commonly known as: ATARAX/VISTARIL Take 25 mg by mouth 3 (three) times daily as needed for anxiety.   levETIRAcetam 500 MG tablet Commonly known as: KEPPRA Take 1 tablet (500 mg total) by mouth 2 (two) times daily.   levofloxacin 500 MG tablet Commonly known as: LEVAQUIN Take 1 tablet (500 mg total) by mouth every other day. Start taking on: August 29, 2019   loratadine 10 MG tablet Commonly known as: CLARITIN Take 1 tablet (10 mg total) by mouth daily.   b complex-C-folic acid 1 MG capsule Take 1 capsule by mouth daily after supper.   multivitamin Tabs tablet Take 1 tablet by mouth daily.   neomycin-bacitracin-polymyxin Oint Commonly known as: NEOSPORIN Apply 1 application topically 2 (two) times daily. What changed:   when to take this  reasons to take this   oxyCODONE 5 MG immediate release tablet Commonly known as: Oxy  IR/ROXICODONE Take 1 tablet (5 mg total) by mouth every 6 (six) hours as needed for severe pain.   sevelamer carbonate 800 MG tablet Commonly known as: RENVELA Take 2 tablets (1,600 mg total) by mouth 3 (three) times daily with meals.            Durable Medical Equipment  (From admission, onward)         Start     Ordered   08/28/19 1617  For home use only DME Walker rolling  Once    Comments: With right platform attachment  Question:  Patient needs a walker to treat with the following condition  Answer:  Weakness generalized   08/28/19 1617         Follow-up Information    Kris Hartmann, NP. Go on 09/04/2019.   Specialty: Vascular Surgery Why: First post-op incision check. No studies needed. Go at 11:00am. Contact information: [place] [address] [phone]        Charlean Sanfilippo, MD.   Specialty: Internal Medicine Why: Call for an appointment with Brett Small, the liver doctor at [facility] o get an appointment in the next 1-2 months Contact information: [place] [address] [phone]        Earlie Server, MD. Schedule an appointment as soon as possible for a visit in 1 month.   Specialty: Oncology Why: Please call office to schedule your appointment. Contact information: [place] [address] [phone]        PCP. Schedule an appointment as soon as possible for a visit in 1 week(s).          Allergies  Allergen Reactions  . Codeine Nausea Only    Patient questioned this (??)  . Sulfa Antibiotics Hives and Nausea And Vomiting    Consultations:  Nephrology  Vascular surgery  Infectious disease   Procedures/Studies: Dg Abd 1 View  Result Date: 08/25/2019 CLINICAL DATA:  Hemodialysis catheter malfunction. EXAM: ABDOMEN - 1 VIEW COMPARISON:  Plain film of the abdomen dated 08/06/2019. FINDINGS: Vascular stents are stable in position/configuration. There is a new RIGHT femoral catheter with  tip projected upwards to the level of the L1 vertebral body. Bowel gas pattern is nonobstructive. No evidence of soft tissue mass or abnormal fluid collection. No evidence  of free intraperitoneal air seen. Surgical clips in the LEFT lower quadrant. IMPRESSION: 1. New RIGHT femoral catheter with tip projected upwards along the expected course of the IVC to the level of the L1 vertebral body. 2. Nonobstructive bowel gas pattern. Electronically Signed   By: Franki Cabot M.D.   On: 08/25/2019 14:55   Dg Abd 1 View  Result Date: 08/06/2019 CLINICAL DATA:  Abdominal distension. EXAM: ABDOMEN - 1 VIEW COMPARISON:  None. FINDINGS: The bowel gas pattern is normal. No radio-opaque calculi or other significant radiographic abnormality are seen. Vascular stent noted. Surgical clips and rectal temperature probe are also seen. Defibrillator pad is in place. IMPRESSION: No acute finding. Electronically Signed   By: Inge Rise M.D.   On: 08/06/2019 11:16   Ct Head Wo Contrast  Result Date: 08/05/2019 CLINICAL DATA:  Cardiac arrest EXAM: CT HEAD WITHOUT CONTRAST TECHNIQUE: Contiguous axial images were obtained from the base of the skull through the vertex without intravenous contrast. COMPARISON:  None. FINDINGS: Brain: No evidence of acute infarction, hemorrhage, hydrocephalus, extra-axial collection or mass lesion/mass effect. Vascular: No hyperdense vessel or unexpected calcification. Skull: Normal. Negative for fracture or focal lesion. Sinuses/Orbits: No acute finding. IMPRESSION: Negative head CT.  No evidence of anoxic injury Electronically Signed   By: Monte Fantasia M.D.   On: 08/05/2019 06:02   Nm Pulmonary Perf And Vent  Result Date: 08/10/2019 CLINICAL DATA:  Shortness of breath EXAM: NUCLEAR MEDICINE VENTILATION - PERFUSION LUNG SCAN Views: Anterior, posterior, left lateral, right lateral, RPO, LPO, RAO, LAO-ventilation perfusion RADIOPHARMACEUTICALS:  9.9 mCi of Tc-72m DTPA aerosol inhalation and  4.34 mCi Tc66m MAA IV COMPARISON:  Chest radiograph August 10, 2019 FINDINGS: Ventilation: No focal ventilation defects are appreciable. There is cardiomegaly which causes a degree of volume loss in the left lower lobe region. Perfusion: No focal perfusion defects are evident. Note that there is cardiomegaly. IMPRESSION: No appreciable ventilation or perfusion defects. Very low probability of pulmonary embolus. Cardiomegaly noted. Electronically Signed   By: Lowella Grip III M.D.   On: 08/10/2019 13:07   US Venous Img Lower Bilateral  Result Date: 08/05/2019 CLINICAL DATA:  Bilateral lower extremity pain and edema. EXAM: BILATERAL LOWER EXTREMITY VENOUS DOPPLER ULTRASOUND TECHNIQUE: Gray-scale sonography with graded compression, as well as color Doppler and duplex ultrasound were performed to evaluate the lower extremity deep venous systems from the level of the common femoral vein and including the common femoral, femoral, profunda femoral, popliteal and calf veins including the posterior tibial, peroneal and gastrocnemius veins when visible. The superficial great saphenous vein was also interrogated. Spectral Doppler was utilized to evaluate flow at rest and with distal augmentation maneuvers in the common femoral, femoral and popliteal veins. COMPARISON:  None. FINDINGS: RIGHT LOWER EXTREMITY Common Femoral Vein: No evidence of thrombus. Normal compressibility, respiratory phasicity and response to augmentation. Saphenofemoral Junction: No evidence of thrombus. Normal compressibility and flow on color Doppler imaging. Profunda Femoral Vein: No evidence of thrombus. Normal compressibility and flow on color Doppler imaging. Femoral Vein: No evidence of thrombus. Normal compressibility, respiratory phasicity and response to augmentation. Popliteal Vein: No evidence of thrombus. Normal compressibility, respiratory phasicity and response to augmentation. Calf Veins: No evidence of thrombus. Normal  compressibility and flow on color Doppler imaging. Superficial Great Saphenous Vein: No evidence of thrombus. Normal compressibility. Venous Reflux:  None. Other Findings: No evidence of superficial thrombophlebitis or abnormal fluid collection. LEFT LOWER EXTREMITY Common Femoral Vein: No evidence of thrombus. Normal compressibility, respiratory phasicity  and response to augmentation. Saphenofemoral Junction: No evidence of thrombus. Normal compressibility and flow on color Doppler imaging. Profunda Femoral Vein: No evidence of thrombus. Normal compressibility and flow on color Doppler imaging. Femoral Vein: No evidence of thrombus. Normal compressibility, respiratory phasicity and response to augmentation. Popliteal Vein: No evidence of thrombus. Normal compressibility, respiratory phasicity and response to augmentation. Calf Veins: No evidence of thrombus. Normal compressibility and flow on color Doppler imaging. Superficial Great Saphenous Vein: No evidence of thrombus. Normal compressibility. Venous Reflux:  None. Other Findings: No evidence of superficial thrombophlebitis or abnormal fluid collection. IMPRESSION: No evidence of deep venous thrombosis in either lower extremity. Electronically Signed   By: Aletta Edouard M.D.   On: 08/05/2019 14:00   US Venous Img Upper Uni Right  Result Date: 08/24/2019 CLINICAL DATA:  43 year old male with right upper extremity pain and swelling. Patient has a right upper extremity arteriovenous hemodialysis graft. EXAM: RIGHT UPPER EXTREMITY VENOUS DOPPLER ULTRASOUND TECHNIQUE: Gray-scale sonography with graded compression, as well as color Doppler and duplex ultrasound were performed to evaluate the upper extremity deep venous system from the level of the subclavian vein and including the jugular, axillary, basilic, radial, ulnar and upper cephalic vein. Spectral Doppler was utilized to evaluate flow at rest and with distal augmentation maneuvers. COMPARISON:  None.  FINDINGS: Contralateral Subclavian Vein: Respiratory phasicity is normal and symmetric with the symptomatic side. No evidence of thrombus. Normal compressibility. Internal Jugular Vein: No evidence of thrombus. Normal compressibility, respiratory phasicity and response to augmentation. Subclavian Vein: No evidence of thrombus. Normal compressibility, respiratory phasicity and response to augmentation. Axillary Vein: No evidence of thrombus. Normal compressibility, respiratory phasicity and response to augmentation. Cephalic Vein: No evidence of thrombus. Normal compressibility, respiratory phasicity and response to augmentation. Basilic Vein: No evidence of thrombus. Normal compressibility, respiratory phasicity and response to augmentation. Brachial Veins: No evidence of thrombus. Normal compressibility, respiratory phasicity and response to augmentation. Radial Veins: One of the paired radial veins is not compressible and demonstrates no evidence of flow on color Doppler imaging. Findings are consistent with thrombosis. Ulnar Veins: No evidence of thrombus. Normal compressibility, respiratory phasicity and response to augmentation. Venous Reflux:  None visualized. Other Findings: There is a large complex heterogeneous fluid collection in the region of the axilla surrounding the arteriovenous graft. The collection is difficult to measure given its amorphous nature. An estimated measurement is 4.2 x 1.5 by 6.5 cm IMPRESSION: 1. Positive for acute occlusive deep venous thrombosis in 1 of the paired radial veins in the forearm. 2. Large complex fluid collection surrounding the arteriovenous graft in the axilla. Differential considerations include hematoma and abscess. 3. The arteriovenous graft remains patent. No evidence of internal thrombus. Signed, Criselda Peaches, MD, [facility] Electronically Signed   By: Jacqulynn Cadet M.D.   On: 08/24/2019  14:41   Dg Chest Port 1 View  Result Date: 08/25/2019 CLINICAL DATA:  Displacement of central venous catheter. EXAM: PORTABLE CHEST 1 VIEW COMPARISON:  08/12/2019 FINDINGS: The right-sided tunneled dialysis catheter is stable in positioning. Multiple stents are noted in the patient's left upper extremity. Heart size remains enlarged. There is vascular congestion with developing pulmonary edema. There is a moderate-sized left-sided pleural effusion with adjacent presumed atelectasis. IMPRESSION: 1. Stable positioning of the tunneled dialysis catheter. 2. Cardiomegaly with vascular congestion and possible early pulmonary edema. 3. Moderate-sized left-sided pleural effusion with adjacent compressive atelectasis. Electronically Signed   By: Jamie Kato.D.  On: 08/25/2019 05:45   Dg Chest Port 1 View  Result Date: 08/12/2019 CLINICAL DATA:  Dialysis catheter placement. End-stage renal disease. EXAM: PORTABLE CHEST 1 VIEW COMPARISON:  08/10/2019 FINDINGS: Right jugular dual-lumen central venous dialysis catheter is seen with leads overlying the distal SVC and right atrium. No evidence of pneumothorax. Stable cardiomegaly. Stable moderate left pleural effusion and left lower lung atelectasis versus infiltrate. Right lung is clear. Intravascular stents seen in the left axilla and upper arm. IMPRESSION: Right jugular dual-lumen central venous catheter in appropriate position. No pneumothorax visualized. No significant change in moderate left pleural effusion, and left lower lung atelectasis versus infiltrate. Stable cardiomegaly. Electronically Signed   By: Marlaine Hind M.D.   On: 08/12/2019 19:54   Dg Chest Port 1 View  Result Date: 08/10/2019 CLINICAL DATA:  Pt has a cough. Hx of hypertension, ESRD, perm cath. Current everyday smoker EXAM: PORTABLE CHEST 1 VIEW COMPARISON:  Radiograph 08/06/2011 FINDINGS: Large-bore central venous line unchanged. Interval extubation. Removal of NG tube. Stable  enlarged cardiac silhouette. There is a moderate LEFT effusion similar prior. Central venous congestion slightly increased. Vascular stent noted in the LEFT axilla IMPRESSION: 1. Extubation without complication. 2. Increase in central venous congestion. 3. Stable LEFT pleural effusion. Electronically Signed   By: Suzy Bouchard M.D.   On: 08/10/2019 11:57   Dg Chest Port 1 View  Result Date: 08/06/2019 CLINICAL DATA:  Acute respiratory failure EXAM: PORTABLE CHEST 1 VIEW COMPARISON:  Radiograph 08/05/2019 FINDINGS: Endotracheal tube in the mid trachea, 3 cm from the carina. Transesophageal tube side port is positioned at the GE junction and should be advanced 2-3 cm for optimal function. Dual lumen right IJ approach dialysis catheter tips terminate in the lower SVC. Pacer pads overlie the chest. Cardiac monitoring leads are present as well. Left axillary vascular stent is noted. Diffuse interstitial opacities again seen throughout the lungs. More dense opacities present in the left lung base. Small left effusion remains. No pneumothorax. IMPRESSION: 1. Endotracheal tube in the mid trachea, 3 cm from the carina. 2. Transesophageal tube side port is positioned at the GE junction and should be advanced 2-3 cm for optimal function. 3. Unchanged interstitial opacities throughout the lungs, which could reflect interstitial edema. 4. More dense opacity in the left lung base could reflect atelectasis, pneumonia or layering effusion. Electronically Signed   By: Lovena Le M.D.   On: 08/06/2019 05:49   Dg Chest Port 1 View  Result Date: 08/05/2019 CLINICAL DATA:  Intubation EXAM: PORTABLE CHEST 1 VIEW COMPARISON:  08/03/2019, 07/27/2019, 07/21/2019 FINDINGS: Interval intubation, tip of the endotracheal tube is about 3.1 cm superior to the carina. Right-sided central venous catheter tips over the SVC and proximal right atrium. Cardiomegaly with vascular congestion and diffuse interstitial and ground-glass opacity  consistent with edema. Small left effusion likely layering. Dense left lung base atelectasis or pneumonia. IMPRESSION: 1. Endotracheal tube tip about 3.1 cm superior to carina 2. Slightly improved aeration on the right. 3. Cardiomegaly with vascular congestion and pulmonary edema. Asymmetric opacity left thorax likely due to layering pleural effusion. Dense left lung base atelectasis or pneumonia Electronically Signed   By: Donavan Foil M.D.   On: 08/05/2019 04:02   Dg Chest Portable 1 View  Result Date: 08/03/2019 CLINICAL DATA:  Respiratory distress. EXAM: PORTABLE CHEST 1 VIEW COMPARISON:  October 5th 2020. FINDINGS: Stable cardiomegaly. No pneumothorax is noted. Increased bilateral perihilar and basilar opacities are noted concerning for worsening edema or possibly pneumonia. Small  left pleural effusion is noted. Stable right internal jugular dialysis catheter is noted. Bony thorax is unremarkable. IMPRESSION: Increased bilateral lung opacities are noted concerning for worsening edema or possibly pneumonia. Small left pleural effusion is noted. Electronically Signed   By: Marijo Conception M.D.   On: 08/03/2019 09:43   Dg Abd Portable 1v  Result Date: 08/05/2019 CLINICAL DATA:  Nasal/orogastric tube placement. EXAM: PORTABLE ABDOMEN - 1 VIEW COMPARISON:  CT, 07/20/2019. FINDINGS: Nasal/orogastric tube passes below the diaphragm. Tip lies in the proximal stomach. Side hole lies in the expected location of the GE junction. Normal bowel gas pattern. Vascular stents in the upper abdomen centrally. IMPRESSION: 1. Nasal/orogastric tube tip in the proximal stomach. Side hole projects at the GE junction. 2. Normal bowel gas pattern. No evidence of a generalized adynamic ileus. No evidence of a bowel obstruction. Electronically Signed   By: Lajean Manes M.D.   On: 08/05/2019 13:59   US Abdomen Limited Ruq  Result Date: 08/03/2019 CLINICAL DATA:  Right upper quadrant pain.  End-stage renal disease. EXAM:  ULTRASOUND ABDOMEN LIMITED RIGHT UPPER QUADRANT COMPARISON:  07/20/2019 FINDINGS: Gallbladder: No gallstones or wall thickening visualized. No sonographic Murphy sign noted by sonographer. Common bile duct: Diameter: 4 mm Liver: No focal lesion identified. Within normal limits in parenchymal echogenicity. Portal vein is patent on color Doppler imaging with normal direction of blood flow towards the liver. Other: Bilateral pleural effusions and small to moderate volume ascites within the visualized abdomen. IMPRESSION: 1. Unremarkable sonographic appearance of the liver and gallbladder. 2. Small to moderate volume ascites. 3. Bilateral pleural effusions. Electronically Signed   By: Davina Poke M.D.   On: 08/03/2019 12:51       Subjective: Feeling well.  Arm hurts, but no other concerns.  No bleeding, epistaxis.  No vomting, jaundice or confusion.  Discharge Exam: Vitals:   08/28/19 1346 08/28/19 1634  BP: 126/81 (!) 145/89  Pulse: 78 82  Resp: 20   Temp: 98.4 F (36.9 C) 98.2 F (36.8 C)  SpO2: 93% 98%   Vitals:   08/28/19 0839 08/28/19 1310 08/28/19 1346 08/28/19 1634  BP: (!) 144/87 137/82 126/81 (!) 145/89  Pulse: 78 77 78 82  Resp:  18 20   Temp: (!) 97.5 F (36.4 C) 98 F (36.7 C) 98.4 F (36.9 C) 98.2 F (36.8 C)  TempSrc: Oral Oral Oral Oral  SpO2: 96% 94% 93% 98%  Weight:      Height:        General: Pt is alert, awake, not in acute distress, interactive, lying in bed Cardiovascular: RRR, nl S1-S2, no murmurs appreciated.   No LE edema.   Respiratory: Normal respiratory rate and rhythm.  CTAB without rales or wheezes. Abdominal: Abdomen soft and non-tender.  No distension or HSM.   Neuro/Psych: Strength symmetric in upper and lower extremities.  Judgment and insight appear normal.   The results of significant diagnostics from this hospitalization (including imaging, microbiology, ancillary and laboratory) are listed below for reference.      Microbiology: Recent Results (from the past 240 hour(s))  Aerobic/Anaerobic Culture (surgical/deep wound)     Status: None (Preliminary result)   Collection Time: 08/24/19 12:22 PM   Specimen: Wound  Result Value Ref Range Status   Specimen Description   Final    WOUND Performed at [facility], [address], [place], [place] [address]    Special Requests   Final    Normal Performed at [facility], [place], [place] [address]    Gram Stain   Final    RARE WBC PRESENT, PREDOMINANTLY PMN RARE GRAM POSITIVE COCCI IN PAIRS Performed at [facility], [address] [place], [place] [address]    Culture   Final    FEW STENOTROPHOMONAS MALTOPHILIA FEW ACINETOBACTER CALCOACETICUS/BAUMANNII COMPLEX NO ANAEROBES ISOLATED; CULTURE IN PROGRESS FOR 5 DAYS    Report Status PENDING  Incomplete   Organism ID, Bacteria STENOTROPHOMONAS MALTOPHILIA  Final   Organism ID, Bacteria ACINETOBACTER CALCOACETICUS/BAUMANNII COMPLEX  Final      Susceptibility   Acinetobacter calcoaceticus/baumannii complex - MIC*    CEFTAZIDIME 4 SENSITIVE Sensitive     CEFTRIAXONE 16 INTERMEDIATE Intermediate     CIPROFLOXACIN <=0.25 SENSITIVE Sensitive     GENTAMICIN <=1 SENSITIVE Sensitive     IMIPENEM <=0.25 SENSITIVE Sensitive     PIP/TAZO <=4 SENSITIVE Sensitive     TRIMETH/SULFA <=20 SENSITIVE Sensitive     CEFEPIME 2 SENSITIVE Sensitive     AMPICILLIN/SULBACTAM <=2 SENSITIVE Sensitive     * FEW ACINETOBACTER CALCOACETICUS/BAUMANNII COMPLEX   Stenotrophomonas maltophilia - MIC*    LEVOFLOXACIN 0.5 SENSITIVE Sensitive     TRIMETH/SULFA <=20 SENSITIVE Sensitive     * FEW STENOTROPHOMONAS MALTOPHILIA  SARS Coronavirus 2 by RT PCR (hospital order, performed in [place] hospital lab) Nasopharyngeal Nasopharyngeal Swab     Status: None   Collection Time: 08/24/19  1:37 PM   Specimen: Nasopharyngeal Swab  Result Value Ref Range Status   SARS Coronavirus 2  NEGATIVE NEGATIVE Final    Comment: (NOTE) If result is NEGATIVE SARS-CoV-2 target nucleic acids are NOT DETECTED. The SARS-CoV-2 RNA is generally detectable in upper and lower  respiratory specimens during the acute phase of infection. The lowest  concentration of SARS-CoV-2 viral copies this assay can detect is 250  copies / mL. A negative result does not preclude SARS-CoV-2 infection  and should not be used as the sole basis for treatment or other  patient management decisions.  A negative result may occur with  improper specimen collection / handling, submission of specimen other  than nasopharyngeal swab, presence of viral mutation(s) within the  areas targeted by this assay, and inadequate number of viral copies  (<250 copies / mL). A negative result must be combined with clinical  observations, patient history, and epidemiological information. If result is POSITIVE SARS-CoV-2 target nucleic acids are DETECTED. The SARS-CoV-2 RNA is generally detectable in upper and lower  respiratory specimens dur ing the acute phase of infection.  Positive  results are indicative of active infection with SARS-CoV-2.  Clinical  correlation with patient history and other diagnostic information is  necessary to determine patient infection status.  Positive results do  not rule out bacterial infection or co-infection with other viruses. If result is PRESUMPTIVE POSTIVE SARS-CoV-2 nucleic acids MAY BE PRESENT.   A presumptive positive result was obtained on the submitted specimen  and confirmed on repeat testing.  While 2019 novel coronavirus  (SARS-CoV-2) nucleic acids may be present in the submitted sample  additional confirmatory testing may be necessary for epidemiological  and / or clinical management purposes  to differentiate between  SARS-CoV-2 and other Sarbecovirus currently known to infect humans.  If clinically indicated additional testing with an alternate test  methodology [phone])  is advised. The SARS-CoV-2 RNA is generally  detectable in upper and lower respiratory sp ecimens during the acute  phase of infection. The expected result is Negative.  Fact Sheet for Patients:  StrictlyIdeas.no Fact Sheet for Healthcare Providers: BankingDealers.co.za This test is not yet approved or cleared by the [place] FDA and has been authorized for detection and/or diagnosis of SARS-CoV-2 by FDA under an Emergency Use Authorization (EUA).  This EUA will remain in effect (meaning this test can be used) for the duration of the COVID-19 declaration under Section 564(b)(1) of the Act, 21 U.S.C. section 360bbb-3(b)(1), unless the authorization is terminated or revoked sooner. Performed at [facility], [address] [place], [place] [address]      Labs: BNP (last 3 results) Recent Labs    07/20/19 1029 08/03/19 0925 08/05/19 0431  BNP >4,500.0* >4,500.0* [id number]*   Basic Metabolic Panel: Recent Labs  Lab 08/24/19 1218 08/25/19 0251 08/25/19 1155 08/26/19 0426 08/27/19 0456 08/27/19 0933 08/28/19 0610  NA 133* 139  --  138 138  --  140  K 6.1* 5.4*  --  3.4* 4.1  --  3.5  CL 93* 98  --  102 100  --  100  CO2 13* 23  --  24 24  --  28  GLUCOSE 129* 113*  --  135* 169*  --  117*  BUN 124* 128*  --  47* 56*  --  28*  CREATININE 8.14* 7.83*  --  3.92* 4.62*  --  2.60*  CALCIUM 9.0 8.5*  --  8.0* 8.4*  --  7.5*  MG  --  2.5*  --  1.9 2.0  --   --   PHOS  --   --  6.3*  --   --  4.0  --    Liver Function Tests: Recent Labs  Lab 08/24/19 1218 08/26/19 0426 08/27/19 0933 08/28/19 0610  AST 475* 162* 79* 56*  ALT 515* 322* 168* 96*  ALKPHOS 137* 107 106 88  BILITOT 3.2* 2.5* 1.9* 1.9*  PROT 6.3* 5.2* 5.5* 4.9*  ALBUMIN 3.3* 2.7* 2.7* 2.4*   No results for input(s): LIPASE, AMYLASE in the last 168 hours. No results for input(s): AMMONIA in the last 168 hours. CBC: Recent Labs  Lab 08/24/19 1218   08/25/19 1455 08/26/19 0426 08/26/19 1506 08/27/19 0456 08/28/19 0610  WBC 12.4*   < > 10.9* 6.6 7.4 9.3 8.4  NEUTROABS 10.2*  --   --   --   --   --   --   HGB 10.1*   < > 9.2* 8.2* 8.4* 7.4* 7.0*  HCT 32.1*   < > 28.7* 25.9* 26.5* 23.3* 23.5*  MCV 103.2*   < > 101.1* 100.4* 102.3* 100.0 105.9*  PLT 51*   < > 35* 29* 108* 95* 80*   < > = values in this interval not displayed.   Cardiac Enzymes: No results for input(s): CKTOTAL, CKMB, CKMBINDEX, TROPONINI in the last 168 hours. BNP: Invalid input(s): POCBNP CBG: Recent Labs  Lab 08/24/19 1806  GLUCAP 80   D-Dimer No results for input(s): DDIMER in the last 72 hours. Hgb A1c No results for input(s): HGBA1C in the last 72 hours. Lipid Profile No results for input(s): CHOL, HDL, LDLCALC, TRIG, CHOLHDL, LDLDIRECT in the last 72 hours. Thyroid function studies No results for input(s): TSH, T4TOTAL, T3FREE, THYROIDAB in the last 72 hours.  Invalid input(s): FREET3 Anemia work up [employer]    08/26/19 0426 08/26/19 0856 08/28/19 0610  VITAMINB12 7,110*  --   --   FOLATE  --  14.9  --   FERRITIN  --   --  222  TIBC  --   --  233*  IRON  --   --  27*  RETICCTPCT 6.0*  --  4.8*   Urinalysis    Component Value Date/Time   COLORURINE AMBER (A) 08/05/2019 2049   APPEARANCEUR CLOUDY (A) 08/05/2019 2049   LABSPEC 1.023 08/05/2019 2049   PHURINE 6.0 08/05/2019 2049   GLUCOSEU NEGATIVE 08/05/2019 2049   HGBUR MODERATE (A) 08/05/2019 2049   [place] NEGATIVE 08/05/2019 2049   [place] 08/05/2019 2049   PROTEINUR 100 (A) 08/05/2019 2049   NITRITE NEGATIVE 08/05/2019 2049   LEUKOCYTESUR SMALL (A) 08/05/2019 2049   Sepsis Labs Invalid input(s): PROCALCITONIN,  WBC,  LACTICIDVEN Microbiology Recent Results (from the past 240 hour(s))  Aerobic/Anaerobic Culture (surgical/deep wound)     Status: None (Preliminary result)   Collection Time: 08/24/19 12:22 PM   Specimen: Wound  Result Value Ref Range Status    Specimen Description   Final    WOUND Performed at [facility], [address], [place], [place] [address]    Special Requests   Final    Normal Performed at [facility], [address] [place], [place] [address]    Gram Stain   Final    RARE WBC PRESENT, PREDOMINANTLY PMN RARE GRAM POSITIVE COCCI IN PAIRS Performed at [facility], [place] [address], [place], [place] [address]    Culture   Final    FEW STENOTROPHOMONAS MALTOPHILIA FEW ACINETOBACTER CALCOACETICUS/BAUMANNII COMPLEX NO ANAEROBES ISOLATED; CULTURE IN PROGRESS FOR 5 DAYS    Report Status PENDING  Incomplete   Organism ID, Bacteria STENOTROPHOMONAS MALTOPHILIA  Final   Organism ID, Bacteria ACINETOBACTER CALCOACETICUS/BAUMANNII COMPLEX  Final      Susceptibility   Acinetobacter calcoaceticus/baumannii complex - MIC*    CEFTAZIDIME 4 SENSITIVE Sensitive     CEFTRIAXONE 16 INTERMEDIATE Intermediate     CIPROFLOXACIN <=0.25 SENSITIVE Sensitive     GENTAMICIN <=1 SENSITIVE Sensitive     IMIPENEM <=0.25 SENSITIVE Sensitive     PIP/TAZO <=4 SENSITIVE Sensitive     TRIMETH/SULFA <=20 SENSITIVE Sensitive     CEFEPIME 2 SENSITIVE Sensitive     AMPICILLIN/SULBACTAM <=2 SENSITIVE Sensitive     * FEW ACINETOBACTER CALCOACETICUS/BAUMANNII COMPLEX   Stenotrophomonas maltophilia - MIC*    LEVOFLOXACIN 0.5 SENSITIVE Sensitive     TRIMETH/SULFA <=20 SENSITIVE Sensitive     * FEW STENOTROPHOMONAS MALTOPHILIA  SARS Coronavirus 2 by RT PCR (hospital order, performed in [place] hospital lab) Nasopharyngeal Nasopharyngeal Swab     Status: None   Collection Time: 08/24/19  1:37 PM   Specimen: Nasopharyngeal Swab  Result Value Ref Range Status   SARS Coronavirus 2 NEGATIVE NEGATIVE Final    Comment: (NOTE) If result is NEGATIVE SARS-CoV-2 target nucleic acids are NOT DETECTED. The SARS-CoV-2 RNA is generally detectable in upper and lower  respiratory specimens during the acute phase of infection. The  lowest  concentration of SARS-CoV-2 viral copies this assay can detect is 250  copies / mL. A negative result does not preclude SARS-CoV-2 infection  and should not be used as the sole basis for treatment or other  patient management decisions.  A negative result may occur with  improper specimen collection / handling, submission of specimen other  than nasopharyngeal swab, presence of viral mutation(s) within the  areas targeted by this assay, and inadequate number of viral copies  (<250 copies / mL). A negative result must be combined with clinical  observations, patient history,  and epidemiological information. If result is POSITIVE SARS-CoV-2 target nucleic acids are DETECTED. The SARS-CoV-2 RNA is generally detectable in upper and lower  respiratory specimens dur ing the acute phase of infection.  Positive  results are indicative of active infection with SARS-CoV-2.  Clinical  correlation with patient history and other diagnostic information is  necessary to determine patient infection status.  Positive results do  not rule out bacterial infection or co-infection with other viruses. If result is PRESUMPTIVE POSTIVE SARS-CoV-2 nucleic acids MAY BE PRESENT.   A presumptive positive result was obtained on the submitted specimen  and confirmed on repeat testing.  While 2019 novel coronavirus  (SARS-CoV-2) nucleic acids may be present in the submitted sample  additional confirmatory testing may be necessary for epidemiological  and / or clinical management purposes  to differentiate between  SARS-CoV-2 and other Sarbecovirus currently known to infect humans.  If clinically indicated additional testing with an alternate test  methodology [phone]) is advised. The SARS-CoV-2 RNA is generally  detectable in upper and lower respiratory sp ecimens during the acute  phase of infection. The expected result is Negative. Fact Sheet for Patients:   StrictlyIdeas.no Fact Sheet for Healthcare Providers: BankingDealers.co.za This test is not yet approved or cleared by the [place] FDA and has been authorized for detection and/or diagnosis of SARS-CoV-2 by FDA under an Emergency Use Authorization (EUA).  This EUA will remain in effect (meaning this test can be used) for the duration of the COVID-19 declaration under Section 564(b)(1) of the Act, 21 U.S.C. section 360bbb-3(b)(1), unless the authorization is terminated or revoked sooner. Performed at [facility], [address] [place], [place] [address]      Time coordinating discharge: 40 minutes The [place] controlled substances registry was reviewed for this patient prior to filling the <5 days supply controlled substances script.      SIGNED:   Edwin Dada, MD  [facility] 08/28/2019, 5:59 PM

## 2019-08-28 NOTE — Progress Notes (Signed)
Communicated with vascular and hospitalist- will send him home on 7 doses of levaquin  ( 500mg  PO every 48 hrs) ( acinetobacter and stenotrophomonas in the AVG site wound)

## 2019-08-28 NOTE — Care Management Important Message (Signed)
Important Message  Patient Details  Name: Brett Small MRN: [id number] Date of Birth: [birth year]   Medicare Important Message Given:  Yes  Initial Medicare IM given by Patient Access Associate on 08/27/2019 at 2:15pm.    Dannette Barbara 08/28/2019, 8:21 AM

## 2019-08-28 NOTE — Evaluation (Signed)
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  Clinical Impression  Patient resting with eyes closed, but easily arousable upon arrival to room.  Blood transfusion in process, but cleared per RN/MD for participation with session; site remained intact, patient asymptomatic.  R UE with ace wrap and dressing in place; frequent cuing to patient to avoid use and protect extremity (avoid extension).  Sling adjusted for appropriate fit and position throughout session.  Demonstrates ability to complete bed mobility close sup; sit/stand, basic transfers and gait (35') with R PFRW, cga/close sup.  Noting improved balance, fluidity and overall safety with use of PFRW (compared to brief trial with loftstrand).  Mod vaulting over L LE (due to baseline ROM deficits), but no overt buckling or LOB. Patient voices comfort with PFRW and agrees to use at discharge. Would benefit from skilled PT to address above deficits and promote optimal return to PLOF.; Recommend transition to HHPT upon discharge from acute hospitalization.     Follow Up Recommendations Home health PT    Equipment Recommendations  (has RW; needs platform attachment)    Recommendations for Other Services       Precautions / Restrictions Precautions Precautions: Fall Precaution Comments: R UE sling as much as possible, avoid extension of R UE.  Cleared for use of PFRW per Herbert Moors Restrictions Weight Bearing Restrictions: No      Mobility  Bed Mobility Overal bed mobility: Needs Assistance Bed Mobility: Supine to Sit     Supine to sit: Supervision        Transfers Overall transfer level: Needs assistance Equipment  used: Lofstrands Transfers: Sit to/from Stand Sit to Stand: Min assist         General transfer comment: assist for balance, assist device management; generally unsteady  Ambulation/Gait Ambulation/Gait assistance: Min assist Gait Distance (Feet): 3 Feet Assistive device: Lofstrands       General Gait Details: difficulty with sequencing and placement of assist device; balance limited due to L ankle ROM limitations. Requires hands-on assist at all times  Stairs            Wheelchair Mobility    Modified Rankin (Stroke Patients Only)       Balance Overall balance assessment: Needs assistance Sitting-balance support: No upper extremity supported;Feet supported Sitting balance-Leahy Scale: Fair     Standing balance support: Single extremity supported Standing balance-Leahy Scale: Poor                               Pertinent Vitals/Pain Pain Assessment: No/denies pain    Home Living Family/patient expects to be discharged to:: (Boarding home)                      Prior Function Level of Independence: Independent with assistive device(s)         Comments: Mod indep with ADLs, household mobilization; endorses recent use of RW with mobility.  Pt takes transport to dialysis TTS, is not out of the home much otherwise.     Hand Dominance        Extremity/Trunk Assessment   Upper Extremity Assessment Upper Extremity Assessment: Overall WFL for tasks assessed(R  UE with ace wrap, dressing; sling adjusted for appropriate fit and position.)    Lower Extremity Assessment Lower Extremity Assessment: Overall WFL for tasks assessed(L ankle lacking approx 10-15 degrees DF (previous surgical procedure), otherwise grossly WFL)       Communication   Communication: No difficulties  Cognition Arousal/Alertness: Awake/alert Behavior During Therapy: WFL for tasks assessed/performed Overall Cognitive Status: Within Functional Limits for tasks  assessed                                        General Comments      Exercises Other Exercises Other Exercises: 43' with R PFRW, cga/close sup-improved balance, fluidity and overall safety with use of PFRW.  Mod vaulting over L LE (due to baseline ROM deficits), but no overt buckling or LOB. Patient voices comfort with PFRW and agrees to use at discharge.   Assessment/Plan    PT Assessment Patient needs continued PT services  PT Problem List Decreased range of motion;Decreased balance;Decreased mobility;Decreased safety awareness;Decreased skin integrity       PT Treatment Interventions DME instruction;Gait training;Functional mobility training;Therapeutic activities;Stair training;Therapeutic exercise;Balance training;Patient/family education;Cognitive remediation    PT Goals (Current goals can be found in the Care Plan section)  Acute Rehab PT Goals Patient Stated Goal: return to home PT Goal Formulation: With patient Time For Goal Achievement: 09/11/19 Potential to Achieve Goals: Fair    Frequency Min 2X/week   Barriers to discharge Decreased caregiver support      Co-evaluation               AM-PAC PT "6 Clicks" Mobility  Outcome Measure Help needed turning from your back to your side while in a flat bed without using bedrails?: None Help needed moving from lying on your back to sitting on the side of a flat bed without using bedrails?: None Help needed moving to and from a bed to a chair (including a wheelchair)?: A Little Help needed standing up from a chair using your arms (e.g., wheelchair or bedside chair)?: A Little Help needed to walk in hospital room?: A Little Help needed climbing 3-5 steps with a railing? : A Little 6 Click Score: 20    End of Session Equipment Utilized During Treatment: Gait belt Activity Tolerance: Patient tolerated treatment well Patient left: in bed;with call bell/phone within reach;with bed alarm set Nurse  Communication: Mobility status PT Visit Diagnosis: Muscle weakness (generalized) (M62.81);Difficulty in walking, not elsewhere classified (R26.2)    Time: [id number] PT Time Calculation (min) (ACUTE ONLY): 19 min   Charges:   PT Evaluation $PT Eval Moderate Complexity: 1 Mod PT Treatments $Gait Training: 8-22 mins        Haelie Clapp H. Owens Shark, PT, DPT, NCS 08/28/19, 4:53 PM [phone]

## 2019-08-28 NOTE — Progress Notes (Signed)
PT Cancellation Note  Patient Details Name: Brett Small MRN: [id number] DOB: 1976/01/03   Cancelled Treatment:    Reason Eval/Treat Not Completed: Medical issues which prohibited therapy(Consult received and chart reviewed.  Patient noted with HgB 7.0, pending transfusion this date.  Contraindicated for exertional activity.  Will re-attempt at later time/date as medically appropriate and available.)  Orlie Cundari H. Owens Shark, PT, DPT, NCS 08/28/19, 11:57 AM [phone]

## 2019-08-29 LAB — BPAM RBC
Blood Product Expiration Date: 202012022359
Blood Product Expiration Date: 202012022359
ISSUE DATE / TIME: 202011061320
Unit Type and Rh: 6200
Unit Type and Rh: 6200

## 2019-08-29 LAB — TYPE AND SCREEN
ABO/RH(D): AB POS
Antibody Screen: NEGATIVE
Unit division: 0
Unit division: 0

## 2019-08-29 LAB — AEROBIC/ANAEROBIC CULTURE W GRAM STAIN (SURGICAL/DEEP WOUND): Special Requests: NORMAL

## 2019-09-01 LAB — CULTURE, BLOOD (ROUTINE X 2)
Culture: NO GROWTH
Culture: NO GROWTH
Special Requests: ADEQUATE
Special Requests: ADEQUATE

## 2019-09-04 ENCOUNTER — Other Ambulatory Visit: Payer: Self-pay

## 2019-09-04 ENCOUNTER — Ambulatory Visit (INDEPENDENT_AMBULATORY_CARE_PROVIDER_SITE_OTHER): Payer: Medicare Other | Admitting: Nurse Practitioner

## 2019-09-04 ENCOUNTER — Encounter (INDEPENDENT_AMBULATORY_CARE_PROVIDER_SITE_OTHER): Payer: Self-pay | Admitting: Nurse Practitioner

## 2019-09-04 VITALS — BP 179/137 | HR 101 | Resp 16 | Wt 98.6 lb

## 2019-09-04 DIAGNOSIS — I10 Essential (primary) hypertension: Secondary | ICD-10-CM

## 2019-09-04 DIAGNOSIS — Z720 Tobacco use: Secondary | ICD-10-CM

## 2019-09-04 DIAGNOSIS — T8131XS Disruption of external operation (surgical) wound, not elsewhere classified, sequela: Secondary | ICD-10-CM

## 2019-09-07 ENCOUNTER — Encounter (INDEPENDENT_AMBULATORY_CARE_PROVIDER_SITE_OTHER): Payer: Self-pay | Admitting: Nurse Practitioner

## 2019-09-07 NOTE — Progress Notes (Signed)
SUBJECTIVE:  Patient ID: Brett Small, male    DOB: [birth year], 43 y.o.   MRN: [id number] Chief Complaint  Patient presents with  . Follow-up    [facility] 1week post op incision check    HPI  Brett Small is a 43 y.o. male that presents for a 1 week postoperative incision check.  The patient originally had a right brachial ax graft placed on 08/12/2019.  Our office was subsequently contacted by his home health nurse with concern for medication compliance and possible infection of his wound.  This was on 08/21/2019.  The patient had an upcoming appointment on 08/23/2019, which was no showed.  The patient presented to the emergency room on 08/24/2019 with a dehiscence of his wound.  The wound also subsequently became infected so on 08/26/2019 the exposed brachial axillary graft was excised.  Today the wound is clean dry and intact.  There is no evidence of infection.  The wound is showing no evidence of dehiscence.  Overall the patient states that the wound is giving him more issue.  He denies issues with fever, chills, nausea, vomiting or diarrhea.  He denies chest pain or shortness of breath.  Past Medical History:  Diagnosis Date  . ESRD (end stage renal disease) ([place])   . Hypertension   . PAD (peripheral artery disease) ([facility])    Required aortofemoral stent-which had closed and had to redo the procedure and ischemia of limb.  . Peripheral vascular disease ([place])   . Renal disorder   . Secondary hyperparathyroidism of renal origin [facility])     Past Surgical History:  Procedure Laterality Date  . A/V SHUNT INTERVENTION Left 01/19/2019   Procedure: LEFT UPPER EXTREMITY A/V SHUNTOGRAM / UPPER EXTREMITY ANGIOGRAM;  Surgeon: Algernon Huxley, MD;  Location: [place] [facility];  Service: Cardiovascular;  Laterality: Left;  . A/V SHUNT INTERVENTION Left 03/02/2019   Procedure: A/V SHUNT INTERVENTION;  Surgeon: Algernon Huxley, MD;  Location: [place] [facility];  Service: Cardiovascular;  Laterality: Left;   . AORTA - FEMORAL ARTERY BYPASS GRAFT    . AV FISTULA PLACEMENT Left 12/26/2018   Procedure: INSERTION OF GORE STRETCH VASCULAR 4-7MM X  45CM IN LEFT UPPER ARM;  Surgeon: Marty Heck, MD;  Location: [place];  Service: Vascular;  Laterality: Left;  . AV FISTULA PLACEMENT Right 08/12/2019   Procedure: INSERTION OF ARTERIOVENOUS (AV) GORE-TEX GRAFT ARM;  Surgeon: Algernon Huxley, MD;  Location: [facility];  Service: Vascular;  Laterality: Right;  . DIALYSIS/PERMA CATHETER REMOVAL N/A 02/06/2019   Procedure: DIALYSIS/PERMA CATHETER REMOVAL;  Surgeon: Algernon Huxley, MD;  Location: [place] [facility];  Service: Cardiovascular;  Laterality: N/A;  . LEFT HEART CATH AND CORONARY ANGIOGRAPHY Right 07/10/2019   Procedure: LEFT HEART CATH AND CORONARY ANGIOGRAPHY;  Surgeon: Dionisio David, MD;  Location: [place] [facility];  Service: Cardiovascular;  Laterality: Right;  . PERIPHERAL VASCULAR THROMBECTOMY Left 07/28/2019   Procedure: Left Upper Extremity Dialysis Access Declot;  Surgeon: Katha Cabal, MD;  Location: [place] [facility];  Service: Cardiovascular;  Laterality: Left;  . REMOVAL OF A DIALYSIS CATHETER Right 08/26/2019   Procedure: REMOVAL OF A DIALYSIS CATHETER;  Surgeon: Algernon Huxley, MD;  Location: [facility];  Service: Vascular;  Laterality: Right;  . TEMPORARY DIALYSIS CATHETER N/A 08/25/2019   Procedure: TEMPORARY DIALYSIS CATHETER;  Surgeon: Katha Cabal, MD;  Location: [place] [facility];  Service: Cardiovascular;  Laterality: N/A;  . UPPER EXTREMITY ANGIOGRAPHY  Left 08/13/2019   Procedure: UPPER EXTREMITY ANGIOGRAPHy;  Surgeon: Algernon Huxley, MD;  Location: [place] [facility];  Service: Cardiovascular;  Laterality: Left;    Social History   Socioeconomic History  . Marital status: Single    Spouse name: Not on file  . Number of children: Not on file  . Years of education: Not on file  . Highest education level: Not on file  Occupational History  . Not on file   Social Needs  . Financial resource strain: Not on file  . Food insecurity    Worry: Not on file    Inability: Not on file  . Transportation needs    Medical: Not on file    Non-medical: Not on file  Tobacco Use  . Smoking status: Current Every Day Smoker    Packs/day: 0.50    Years: 30.00    Pack years: 15.00    Types: Cigarettes    Last attempt to quit: 06/26/2018    Years since quitting: 1.2  . Smokeless tobacco: Never Used  . Tobacco comment: smoked for 30 years   Substance and Sexual Activity  . Alcohol use: Not Currently  . Drug use: Yes    Frequency: 1.0 times per week    Types: Marijuana    Comment: Pt states "maybe once or twice a week".   . Sexual activity: Not on file  Lifestyle  . Physical activity    Days per week: Not on file    Minutes per session: Not on file  . Stress: Not on file  Relationships  . Social Herbalist on phone: Not on file    Gets together: Not on file    Attends religious service: Not on file    Active member of club or organization: Not on file    Attends meetings of clubs or organizations: Not on file    Relationship status: Not on file  . Intimate partner violence    Fear of current or ex partner: Patient refused    Emotionally abused: Patient refused    Physically abused: Patient refused    Forced sexual activity: Patient refused  Other Topics Concern  . Not on file  Social History Narrative  . Not on file    Family History  Problem Relation Age of Onset  . Hypertension Other   . Diabetes Other   . Clotting disorder Father     Allergies  Allergen Reactions  . Codeine Nausea Only    Patient questioned this (??)  . Sulfa Antibiotics Hives and Nausea And Vomiting     Review of Systems   Review of Systems: Negative Unless Checked Constitutional: [] Weight loss  [] Fever  [] Chills Cardiac: [] Chest pain   []  Atrial Fibrillation  [] Palpitations   [] Shortness of breath when laying flat   [] Shortness of breath with  exertion. [] Shortness of breath at rest Vascular:  [] Pain in legs with walking   [] Pain in legs with standing [] Pain in legs when laying flat   [] Claudication    [] Pain in feet when laying flat    [] History of DVT   [] Phlebitis   [] Swelling in legs   [] Varicose veins   [] Non-healing ulcers Pulmonary:   [] Uses home oxygen   [] Productive cough   [] Hemoptysis   [] Wheeze  [] COPD   [] Asthma Neurologic:  [] Dizziness   [x] Seizures  [] Blackouts [] History of stroke   [] History of TIA  [] Aphasia   [] Temporary Blindness   [] Weakness or numbness in  arm   [] Weakness or numbness in leg Musculoskeletal:   [] Joint swelling   [] Joint pain   [] Low back pain  []  History of Knee Replacement [] Arthritis [] back Surgeries  []  Spinal Stenosis    Hematologic:  [] Easy bruising  [] Easy bleeding   [] Hypercoagulable state   [x] Anemic Gastrointestinal:  [] Diarrhea   [] Vomiting  [x] Gastroesophageal reflux/heartburn   [] Difficulty swallowing. [] Abdominal pain Genitourinary:  [x] Chronic kidney disease   [] Difficult urination  [] Anuric   [] Blood in urine [] Frequent urination  [] Burning with urination   [] Hematuria Skin:  [] Rashes   [] Ulcers [] Wounds Psychological:  [] History of anxiety   []  History of major depression  []  Memory Difficulties      OBJECTIVE:   Physical Exam  BP (!) 179/137 (BP Location: Right Leg)   Pulse (!) 101   Resp 16   Wt 98 lb 9.6 oz (44.7 kg)   BMI 18.63 kg/m   Gen: WD/WN, NAD, appears frail Head: [place]/AT, No temporalis wasting.  Ear/Nose/Throat: Hearing grossly intact, nares w/o erythema or drainage Eyes: PER, EOMI, sclera nonicteric.  Neck: Supple, no masses.  No JVD.  Pulmonary:  Good air movement, no use of accessory muscles.  Cardiac: RRR Vascular:  Wound right upper extremity, clean dry and intact no evidence of dehiscence Vessel Right Left  Radial Palpable Palpable   Gastrointestinal: soft, non-distended. No guarding/no peritoneal signs.  Musculoskeletal: M/S 5/5 throughout.  No  deformity or atrophy.  Neurologic: Pain and light touch intact in extremities.  Symmetrical.  Speech is fluent. Motor exam as listed above. Psychiatric: Judgment intact, Mood & affect appropriate for pt's clinical situation. Dermatologic: No Venous rashes. No Ulcers Noted.  No changes consistent with cellulitis. Lymph : No Cervical lymphadenopathy, no lichenification or skin changes of chronic lymphedema.       ASSESSMENT AND PLAN:  1. Wound dehiscence, surgical, sequela Every other suture was removed today.  Patient will follow-up in 1 week for further suture removal.  He is advised to contact us if he begins to have any purulent drainage or if the wound opens.  Patient understands  2. Essential hypertension Continue antihypertensive medications as already ordered, these medications have been reviewed and there are no changes at this time.   3. Tobacco abuse Smoking cessation was discussed, 3-10 minutes spent on this topic specifically    Current Outpatient Medications on File Prior to Visit  Medication Sig Dispense Refill  . amLODipine (NORVASC) 5 MG tablet Take 1 tablet (5 mg total) by mouth daily. 30 tablet 3  . apixaban (ELIQUIS) 2.5 MG TABS tablet Take 1 tablet (2.5 mg total) by mouth 2 (two) times daily. 60 tablet 1  . atorvastatin (LIPITOR) 80 MG tablet Take 1 tablet (80 mg total) by mouth at bedtime. 30 tablet 1  . b complex-C-folic acid 1 MG capsule Take 1 capsule by mouth daily after supper.     . carvedilol (COREG) 25 MG tablet Take 1 tablet (25 mg total) by mouth 2 (two) times daily with a meal. 60 tablet 1  . gabapentin (NEURONTIN) 100 MG capsule Take 1 capsule (100 mg total) by mouth 3 (three) times daily. 90 capsule 1  . hydrALAZINE (APRESOLINE) 25 MG tablet Take 1 tablet (25 mg total) by mouth every 8 (eight) hours. 90 tablet 1  . hydrOXYzine (ATARAX/VISTARIL) 25 MG tablet Take 25 mg by mouth 3 (three) times daily as needed for anxiety.    . levETIRAcetam (KEPPRA) 500  MG tablet Take 1 tablet (500 mg total) by  mouth 2 (two) times daily. 60 tablet 0  . levofloxacin (LEVAQUIN) 500 MG tablet Take 1 tablet (500 mg total) by mouth every other day. 7 tablet 0  . loratadine (CLARITIN) 10 MG tablet Take 1 tablet (10 mg total) by mouth daily. 30 tablet 0  . multivitamin (RENA-VIT) TABS tablet Take 1 tablet by mouth daily. 30 each 1  . neomycin-bacitracin-polymyxin (NEOSPORIN) OINT Apply 1 application topically 2 (two) times daily. (Patient taking differently: Apply 1 application topically as needed. ) 56 g 1  . Nutritional Supplements (FEEDING SUPPLEMENT, NEPRO CARB STEADY,) LIQD Take 237 mLs by mouth 2 (two) times daily between meals. 237 mL 30  . oxyCODONE (OXY IR/ROXICODONE) 5 MG immediate release tablet Take 1 tablet (5 mg total) by mouth every 6 (six) hours as needed for severe pain. 12 tablet 0  . sevelamer carbonate (RENVELA) 800 MG tablet Take 2 tablets (1,600 mg total) by mouth 3 (three) times daily with meals. 180 tablet 1   No current facility-administered medications on file prior to visit.     There are no Patient Instructions on file for this visit. No follow-ups on file.   Kris Hartmann, NP  This note was completed with Sales executive.  Any errors are purely unintentional.

## 2019-09-11 ENCOUNTER — Ambulatory Visit (INDEPENDENT_AMBULATORY_CARE_PROVIDER_SITE_OTHER): Payer: Medicare Other | Admitting: Nurse Practitioner

## 2019-09-11 ENCOUNTER — Other Ambulatory Visit: Payer: Self-pay

## 2019-09-11 ENCOUNTER — Encounter (INDEPENDENT_AMBULATORY_CARE_PROVIDER_SITE_OTHER): Payer: Self-pay | Admitting: Nurse Practitioner

## 2019-09-11 VITALS — BP 204/158 | HR 116 | Resp 17 | Ht 61.0 in | Wt 98.0 lb

## 2019-09-11 DIAGNOSIS — Z720 Tobacco use: Secondary | ICD-10-CM

## 2019-09-11 DIAGNOSIS — T8131XS Disruption of external operation (surgical) wound, not elsewhere classified, sequela: Secondary | ICD-10-CM

## 2019-09-11 DIAGNOSIS — I10 Essential (primary) hypertension: Secondary | ICD-10-CM

## 2019-09-11 MED ORDER — OXYCODONE HCL 5 MG PO TABS: 5.0000 mg | ORAL_TABLET | Freq: Four times a day (QID) | ORAL | 0 refills | Status: AC | PRN

## 2019-09-13 ENCOUNTER — Encounter (INDEPENDENT_AMBULATORY_CARE_PROVIDER_SITE_OTHER): Payer: Self-pay | Admitting: Nurse Practitioner

## 2019-09-13 NOTE — Progress Notes (Signed)
SUBJECTIVE:  Patient ID: Brett Small, male    DOB: 1976/04/03, 43 y.o.   MRN: [id number] Chief Complaint  Patient presents with  . Follow-up    HPI  Brett Small is a 43 y.o. male that presents today to have his staples removed from his excision of his AV graft after his wound completely dehisced and the graft was compromised.  The patient reports having no fever, chills, nausea, vomiting or diarrhea.  The wound today continues to be clean dry and intact.  No evidence of further dehiscence.  Past Medical History:  Diagnosis Date  . ESRD (end stage renal disease) ([place])   . Hypertension   . PAD (peripheral artery disease) ([facility])    Required aortofemoral stent-which had closed and had to redo the procedure and ischemia of limb.  . Peripheral vascular disease ([place])   . Renal disorder   . Secondary hyperparathyroidism of renal origin [facility])     Past Surgical History:  Procedure Laterality Date  . A/V SHUNT INTERVENTION Left 01/19/2019   Procedure: LEFT UPPER EXTREMITY A/V SHUNTOGRAM / UPPER EXTREMITY ANGIOGRAM;  Surgeon: Algernon Huxley, MD;  Location: [place] [facility];  Service: Cardiovascular;  Laterality: Left;  . A/V SHUNT INTERVENTION Left 03/02/2019   Procedure: A/V SHUNT INTERVENTION;  Surgeon: Algernon Huxley, MD;  Location: [place] [facility];  Service: Cardiovascular;  Laterality: Left;  . AORTA - FEMORAL ARTERY BYPASS GRAFT    . AV FISTULA PLACEMENT Left 12/26/2018   Procedure: INSERTION OF GORE STRETCH VASCULAR 4-7MM X  45CM IN LEFT UPPER ARM;  Surgeon: Marty Heck, MD;  Location: [place];  Service: Vascular;  Laterality: Left;  . AV FISTULA PLACEMENT Right 08/12/2019   Procedure: INSERTION OF ARTERIOVENOUS (AV) GORE-TEX GRAFT ARM;  Surgeon: Algernon Huxley, MD;  Location: [facility];  Service: Vascular;  Laterality: Right;  . DIALYSIS/PERMA CATHETER REMOVAL N/A 02/06/2019   Procedure: DIALYSIS/PERMA CATHETER REMOVAL;  Surgeon: Algernon Huxley, MD;  Location: [place] [facility];  Service: Cardiovascular;  Laterality: N/A;  . LEFT HEART CATH AND CORONARY ANGIOGRAPHY Right 07/10/2019   Procedure: LEFT HEART CATH AND CORONARY ANGIOGRAPHY;  Surgeon: Dionisio David, MD;  Location: [place] [facility];  Service: Cardiovascular;  Laterality: Right;  . PERIPHERAL VASCULAR THROMBECTOMY Left 07/28/2019   Procedure: Left Upper Extremity Dialysis Access Declot;  Surgeon: Katha Cabal, MD;  Location: [place] [facility];  Service: Cardiovascular;  Laterality: Left;  . REMOVAL OF A DIALYSIS CATHETER Right 08/26/2019   Procedure: REMOVAL OF A DIALYSIS CATHETER;  Surgeon: Algernon Huxley, MD;  Location: [facility];  Service: Vascular;  Laterality: Right;  . TEMPORARY DIALYSIS CATHETER N/A 08/25/2019   Procedure: TEMPORARY DIALYSIS CATHETER;  Surgeon: Katha Cabal, MD;  Location: [place] [facility];  Service: Cardiovascular;  Laterality: N/A;  . UPPER EXTREMITY ANGIOGRAPHY Left 08/13/2019   Procedure: UPPER EXTREMITY ANGIOGRAPHy;  Surgeon: Algernon Huxley, MD;  Location: [place] [facility];  Service: Cardiovascular;  Laterality: Left;    Social History   Socioeconomic History  . Marital status: Single    Spouse name: Not on file  . Number of children: Not on file  . Years of education: Not on file  . Highest education level: Not on file  Occupational History  . Not on file  Social Needs  . Financial resource strain: Not on file  . Food insecurity    Worry: Not on file    Inability: Not on  file  . Transportation needs    Medical: Not on file    Non-medical: Not on file  Tobacco Use  . Smoking status: Current Every Day Smoker    Packs/day: 0.50    Years: 30.00    Pack years: 15.00    Types: Cigarettes    Last attempt to quit: 06/26/2018    Years since quitting: 1.2  . Smokeless tobacco: Never Used  . Tobacco comment: smoked for 30 years   Substance and Sexual Activity  . Alcohol use: Not Currently  . Drug use: Yes    Frequency: 1.0 times per week     Types: Marijuana    Comment: Pt states "maybe once or twice a week".   . Sexual activity: Not on file  Lifestyle  . Physical activity    Days per week: Not on file    Minutes per session: Not on file  . Stress: Not on file  Relationships  . Social Herbalist on phone: Not on file    Gets together: Not on file    Attends religious service: Not on file    Active member of club or organization: Not on file    Attends meetings of clubs or organizations: Not on file    Relationship status: Not on file  . Intimate partner violence    Fear of current or ex partner: Patient refused    Emotionally abused: Patient refused    Physically abused: Patient refused    Forced sexual activity: Patient refused  Other Topics Concern  . Not on file  Social History Narrative  . Not on file    Family History  Problem Relation Age of Onset  . Hypertension Other   . Diabetes Other   . Clotting disorder Father     Allergies  Allergen Reactions  . Codeine Nausea Only    Patient questioned this (??)  . Sulfa Antibiotics Hives and Nausea And Vomiting     Review of Systems   Review of Systems: Negative Unless Checked Constitutional: [] Weight loss  [] Fever  [] Chills Cardiac: [] Chest pain   []  Atrial Fibrillation  [] Palpitations   [] Shortness of breath when laying flat   [] Shortness of breath with exertion. [] Shortness of breath at rest Vascular:  [] Pain in legs with walking   [] Pain in legs with standing [] Pain in legs when laying flat   [] Claudication    [] Pain in feet when laying flat    [] History of DVT   [] Phlebitis   [] Swelling in legs   [] Varicose veins   [] Non-healing ulcers Pulmonary:   [] Uses home oxygen   [] Productive cough   [] Hemoptysis   [] Wheeze  [] COPD   [] Asthma Neurologic:  [] Dizziness   [] Seizures  [] Blackouts [] History of stroke   [] History of TIA  [] Aphasia   [] Temporary Blindness   [] Weakness or numbness in arm   [] Weakness or numbness in leg Musculoskeletal:    [] Joint swelling   [] Joint pain   [] Low back pain  []  History of Knee Replacement [] Arthritis [] back Surgeries  []  Spinal Stenosis    Hematologic:  [] Easy bruising  [] Easy bleeding   [] Hypercoagulable state   [x] Anemic Gastrointestinal:  [] Diarrhea   [] Vomiting  [] Gastroesophageal reflux/heartburn   [] Difficulty swallowing. [] Abdominal pain Genitourinary:  [x] Chronic kidney disease   [] Difficult urination  [] Anuric   [] Blood in urine [] Frequent urination  [] Burning with urination   [] Hematuria Skin:  [] Rashes   [] Ulcers [x] Wounds Psychological:  [] History of anxiety   []  History  of major depression  []  Memory Difficulties      OBJECTIVE:   Physical Exam  BP (!) 204/158 (BP Location: Right Leg)   Pulse (!) 116   Resp 17   Ht 5\' 1"  (1.549 m)   Wt 98 lb (44.5 kg)   BMI 18.52 kg/m   Gen: WD/WN, NAD, cachectic appearance Head: [place]/AT, No temporalis wasting.  Ear/Nose/Throat: Hearing grossly intact, nares w/o erythema or drainage Eyes: PER, EOMI, sclera nonicteric.  Neck: Supple, no masses.  No JVD.  Pulmonary:  Good air movement, no use of accessory muscles.  Cardiac: RRR Vascular:  Incision clean dry and intact Vessel Right Left  Radial Palpable Palpable   Gastrointestinal: soft, non-distended. No guarding/no peritoneal signs.  Musculoskeletal: M/S 5/5 throughout.  No deformity or atrophy.  Neurologic: Pain and light touch intact in extremities.  Symmetrical.  Speech is fluent. Motor exam as listed above. Psychiatric: Judgment intact, Mood & affect appropriate for pt's clinical situation. Dermatologic: No Venous rashes. No Ulcers Noted.  No changes consistent with cellulitis. Lymph : No Cervical lymphadenopathy, no lichenification or skin changes of chronic lymphedema.       ASSESSMENT AND PLAN:  1. Wound dehiscence, surgical, sequela Today all of the staples were removed from the wound without issue.  The sutures remain in place.  This is due to an abundance of caution the  patient having multiple occurrences of dehiscence.  We will have the patient return in 1 week to remove sutures provided there has been no worsening of his wound.  2. Essential hypertension Continue antihypertensive medications as already ordered, these medications have been reviewed and there are no changes at this time.   3. Tobacco abuse Smoking cessation was discussed, 3-10 minutes spent on this topic specifically    Current Outpatient Medications on File Prior to Visit  Medication Sig Dispense Refill  . amLODipine (NORVASC) 5 MG tablet Take 1 tablet (5 mg total) by mouth daily. 30 tablet 3  . apixaban (ELIQUIS) 2.5 MG TABS tablet Take 1 tablet (2.5 mg total) by mouth 2 (two) times daily. 60 tablet 1  . atorvastatin (LIPITOR) 80 MG tablet Take 1 tablet (80 mg total) by mouth at bedtime. 30 tablet 1  . b complex-C-folic acid 1 MG capsule Take 1 capsule by mouth daily after supper.     . carvedilol (COREG) 25 MG tablet Take 1 tablet (25 mg total) by mouth 2 (two) times daily with a meal. 60 tablet 1  . gabapentin (NEURONTIN) 100 MG capsule Take 1 capsule (100 mg total) by mouth 3 (three) times daily. 90 capsule 1  . hydrALAZINE (APRESOLINE) 25 MG tablet Take 1 tablet (25 mg total) by mouth every 8 (eight) hours. 90 tablet 1  . hydrOXYzine (ATARAX/VISTARIL) 25 MG tablet Take 25 mg by mouth 3 (three) times daily as needed for anxiety.    . levETIRAcetam (KEPPRA) 500 MG tablet Take 1 tablet (500 mg total) by mouth 2 (two) times daily. 60 tablet 0  . levofloxacin (LEVAQUIN) 500 MG tablet Take 1 tablet (500 mg total) by mouth every other day. 7 tablet 0  . loratadine (CLARITIN) 10 MG tablet Take 1 tablet (10 mg total) by mouth daily. 30 tablet 0  . multivitamin (RENA-VIT) TABS tablet Take 1 tablet by mouth daily. 30 each 1  . neomycin-bacitracin-polymyxin (NEOSPORIN) OINT Apply 1 application topically 2 (two) times daily. (Patient taking differently: Apply 1 application topically as needed. )  56 g 1  . Nutritional Supplements (FEEDING  SUPPLEMENT, NEPRO CARB STEADY,) LIQD Take 237 mLs by mouth 2 (two) times daily between meals. 237 mL 30  . sevelamer carbonate (RENVELA) 800 MG tablet Take 2 tablets (1,600 mg total) by mouth 3 (three) times daily with meals. 180 tablet 1   No current facility-administered medications on file prior to visit.     There are no Patient Instructions on file for this visit. No follow-ups on file.   Kris Hartmann, NP  This note was completed with Sales executive.  Any errors are purely unintentional.

## 2019-09-20 NOTE — Progress Notes (Signed)
Patient ID: Brett Small, male   DOB: Dec 22, 1975, 43 y.o.   MRN: [id number]  No chief complaint on file.   HPI Brett Small is a 43 y.o. male.   PROCEDURE on 08/26/2019: 1.   Excision of entire right brachial artery to axillary vein AV graft 2.   Repair of right brachial artery with CorMatrix patch angioplasty   He denies right arm pain  Past Medical History:  Diagnosis Date  . ESRD (end stage renal disease) ([place])   . Hypertension   . PAD (peripheral artery disease) ([facility])    Required aortofemoral stent-which had closed and had to redo the procedure and ischemia of limb.  . Peripheral vascular disease ([place])   . Renal disorder   . Secondary hyperparathyroidism of renal origin [facility])     Past Surgical History:  Procedure Laterality Date  . A/V SHUNT INTERVENTION Left 01/19/2019   Procedure: LEFT UPPER EXTREMITY A/V SHUNTOGRAM / UPPER EXTREMITY ANGIOGRAM;  Surgeon: Algernon Huxley, MD;  Location: [place] [facility];  Service: Cardiovascular;  Laterality: Left;  . A/V SHUNT INTERVENTION Left 03/02/2019   Procedure: A/V SHUNT INTERVENTION;  Surgeon: Algernon Huxley, MD;  Location: [place] [facility];  Service: Cardiovascular;  Laterality: Left;  . AORTA - FEMORAL ARTERY BYPASS GRAFT    . AV FISTULA PLACEMENT Left 12/26/2018   Procedure: INSERTION OF GORE STRETCH VASCULAR 4-7MM X  45CM IN LEFT UPPER ARM;  Surgeon: Marty Heck, MD;  Location: [place];  Service: Vascular;  Laterality: Left;  . AV FISTULA PLACEMENT Right 08/12/2019   Procedure: INSERTION OF ARTERIOVENOUS (AV) GORE-TEX GRAFT ARM;  Surgeon: Algernon Huxley, MD;  Location: [facility];  Service: Vascular;  Laterality: Right;  . DIALYSIS/PERMA CATHETER REMOVAL N/A 02/06/2019   Procedure: DIALYSIS/PERMA CATHETER REMOVAL;  Surgeon: Algernon Huxley, MD;  Location: [place] [facility];  Service: Cardiovascular;  Laterality: N/A;  . LEFT HEART CATH AND CORONARY ANGIOGRAPHY Right 07/10/2019   Procedure: LEFT HEART CATH AND CORONARY  ANGIOGRAPHY;  Surgeon: Dionisio David, MD;  Location: [place] [facility];  Service: Cardiovascular;  Laterality: Right;  . PERIPHERAL VASCULAR THROMBECTOMY Left 07/28/2019   Procedure: Left Upper Extremity Dialysis Access Decl[place];  Surgeon: Katha Cabal, MD;  Location: [place] [facility];  Service: Cardiovascular;  Laterality: Left;  . REMOVAL OF A DIALYSIS CATHETER Right 08/26/2019   Procedure: REMOVAL OF A DIALYSIS CATHETER;  Surgeon: Algernon Huxley, MD;  Location: [facility];  Service: Vascular;  Laterality: Right;  . TEMPORARY DIALYSIS CATHETER N/A 08/25/2019   Procedure: TEMPORARY DIALYSIS CATHETER;  Surgeon: Katha Cabal, MD;  Location: [place] [facility];  Service: Cardiovascular;  Laterality: N/A;  . UPPER EXTREMITY ANGIOGRAPHY Left 08/13/2019   Procedure: UPPER EXTREMITY ANGIOGRAPHy;  Surgeon: Algernon Huxley, MD;  Location: [place] [facility];  Service: Cardiovascular;  Laterality: Left;      Allergies  Allergen Reactions  . Codeine Nausea Only    Patient questioned this (??)  . Sulfa Antibiotics Hives and Nausea And Vomiting    Current Outpatient Medications  Medication Sig Dispense Refill  . amLODipine (NORVASC) 5 MG tablet Take 1 tablet (5 mg total) by mouth daily. 30 tablet 3  . apixaban (ELIQUIS) 2.5 MG TABS tablet Take 1 tablet (2.5 mg total) by mouth 2 (two) times daily. 60 tablet 1  . atorvastatin (LIPITOR) 80 MG tablet Take 1 tablet (80 mg total) by mouth at bedtime. 30 tablet 1  .  b complex-C-folic acid 1 MG capsule Take 1 capsule by mouth daily after supper.     . carvedilol (COREG) 25 MG tablet Take 1 tablet (25 mg total) by mouth 2 (two) times daily with a meal. 60 tablet 1  . gabapentin (NEURONTIN) 100 MG capsule Take 1 capsule (100 mg total) by mouth 3 (three) times daily. 90 capsule 1  . hydrALAZINE (APRESOLINE) 25 MG tablet Take 1 tablet (25 mg total) by mouth every 8 (eight) hours. 90 tablet 1  . hydrOXYzine (ATARAX/VISTARIL) 25 MG tablet Take 25 mg  by mouth 3 (three) times daily as needed for anxiety.    . levETIRAcetam (KEPPRA) 500 MG tablet Take 1 tablet (500 mg total) by mouth 2 (two) times daily. 60 tablet 0  . levofloxacin (LEVAQUIN) 500 MG tablet Take 1 tablet (500 mg total) by mouth every other day. 7 tablet 0  . loratadine (CLARITIN) 10 MG tablet Take 1 tablet (10 mg total) by mouth daily. 30 tablet 0  . multivitamin (RENA-VIT) TABS tablet Take 1 tablet by mouth daily. 30 each 1  . neomycin-bacitracin-polymyxin (NEOSPORIN) OINT Apply 1 application topically 2 (two) times daily. (Patient taking differently: Apply 1 application topically as needed. ) 56 g 1  . Nutritional Supplements (FEEDING SUPPLEMENT, NEPRO CARB STEADY,) LIQD Take 237 mLs by mouth 2 (two) times daily between meals. 237 mL 30  . sevelamer carbonate (RENVELA) 800 MG tablet Take 2 tablets (1,600 mg total) by mouth 3 (three) times daily with meals. 180 tablet 1   No current facility-administered medications for this visit.         Physical Exam There were no vitals taken for this visit. Gen:  WD/WN, NAD Skin: incision C/D/I     Assessment/Plan: 1. 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    I recommend a right arm venogram to plan for future access.  Risk and benefits were reviewed the patient.  Indications for the procedure were reviewed.  All questions were answered, the patient agrees to proceed.       [name] 09/20/2019, 3:58 PM   This note was created with Dragon medical transcription system.  Any errors from dictation are unintentional.

## 2019-09-21 ENCOUNTER — Encounter (INDEPENDENT_AMBULATORY_CARE_PROVIDER_SITE_OTHER): Payer: Self-pay | Admitting: Vascular Surgery

## 2019-09-21 ENCOUNTER — Other Ambulatory Visit: Payer: Self-pay

## 2019-09-21 ENCOUNTER — Telehealth (INDEPENDENT_AMBULATORY_CARE_PROVIDER_SITE_OTHER): Payer: Self-pay

## 2019-09-21 ENCOUNTER — Ambulatory Visit (INDEPENDENT_AMBULATORY_CARE_PROVIDER_SITE_OTHER): Payer: Medicare Other | Admitting: Vascular Surgery

## 2019-09-21 VITALS — BP 183/150 | HR 114 | Resp 16 | Wt 98.8 lb

## 2019-09-21 DIAGNOSIS — T829XXS Unspecified complication of cardiac and vascular prosthetic device, implant and graft, sequela: Secondary | ICD-10-CM

## 2019-09-21 NOTE — Telephone Encounter (Signed)
I attempted to contact the patient, a message was left for a return call.

## 2019-09-23 ENCOUNTER — Telehealth (INDEPENDENT_AMBULATORY_CARE_PROVIDER_SITE_OTHER): Payer: Self-pay

## 2019-09-23 NOTE — Telephone Encounter (Signed)
Spoke with the patient and he is now scheduled with Dr. Delana Meyer for right arm venogram on 09/30/2019 with a 1:30 pm arrival to the [facility]. Patient will do his covid testing on 09/28/2019 between 12:30-2:30 pm at the [facility]. Pre-procedure instructions were discussed and will be mailed to the patient.

## 2019-09-25 ENCOUNTER — Other Ambulatory Visit: Admission: RE | Admit: 2019-09-25 | Payer: Medicare Other | Source: Ambulatory Visit

## 2019-09-25 ENCOUNTER — Telehealth (INDEPENDENT_AMBULATORY_CARE_PROVIDER_SITE_OTHER): Payer: Self-pay | Admitting: Vascular Surgery

## 2019-09-25 NOTE — Telephone Encounter (Signed)
Reach out to the patient and left a message on voicemail to return a call back to the office

## 2019-09-28 ENCOUNTER — Other Ambulatory Visit: Admission: RE | Admit: 2019-09-28 | Payer: Medicare Other | Source: Ambulatory Visit

## 2019-09-29 ENCOUNTER — Other Ambulatory Visit (INDEPENDENT_AMBULATORY_CARE_PROVIDER_SITE_OTHER): Payer: Self-pay | Admitting: Nurse Practitioner

## 2019-09-30 ENCOUNTER — Encounter: Admission: RE | Payer: Self-pay | Source: Home / Self Care

## 2019-09-30 ENCOUNTER — Ambulatory Visit: Admission: RE | Admit: 2019-09-30 | Payer: Medicare Other | Source: Home / Self Care | Admitting: Vascular Surgery

## 2019-09-30 SURGERY — UPPER EXTREMITY VENOGRAPHY
Anesthesia: Moderate Sedation | Laterality: Right

## 2019-10-01 ENCOUNTER — Telehealth (INDEPENDENT_AMBULATORY_CARE_PROVIDER_SITE_OTHER): Payer: Self-pay

## 2019-10-02 NOTE — Telephone Encounter (Signed)
Unfortunately we only treat Brett Small for his dialysis access.  His fatigue and in ability to keep food down needs to further worked up and assessed, which is something we are unable to do as vascular surgeons.   Depending on how his dialysis has been going that could factor into this.  He can try reaching out to his nephrologist.  He should also contact his primary care doctor or present to urgent care.

## 2019-10-02 NOTE — Telephone Encounter (Signed)
Patient friend was giving medical advice  and stated the patient was thinking on if he should go to a urgent care but he plans on taking him. The patient friend stated the patient has been drinking ginger ale and hasn't got sick last night or this morning.

## 2019-10-03 ENCOUNTER — Inpatient Hospital Stay
Admission: EM | Admit: 2019-10-03 | Discharge: 2019-10-09 | DRG: 368 | Disposition: A | Discharge status: Home Health | Payer: Medicare Other | Attending: Internal Medicine | Admitting: Internal Medicine

## 2019-10-03 ENCOUNTER — Emergency Department: Payer: Medicare Other

## 2019-10-03 ENCOUNTER — Other Ambulatory Visit: Payer: Self-pay

## 2019-10-03 ENCOUNTER — Encounter: Payer: Self-pay | Admitting: Emergency Medicine

## 2019-10-03 DIAGNOSIS — Z8249 Family history of ischemic heart disease and other diseases of the circulatory system: Secondary | ICD-10-CM

## 2019-10-03 DIAGNOSIS — K319 Disease of stomach and duodenum, unspecified: Secondary | ICD-10-CM | POA: Diagnosis present

## 2019-10-03 DIAGNOSIS — D689 Coagulation defect, unspecified: Secondary | ICD-10-CM | POA: Diagnosis present

## 2019-10-03 DIAGNOSIS — D631 Anemia in chronic kidney disease: Secondary | ICD-10-CM | POA: Diagnosis present

## 2019-10-03 DIAGNOSIS — K2101 Gastro-esophageal reflux disease with esophagitis, with bleeding: Secondary | ICD-10-CM | POA: Diagnosis not present

## 2019-10-03 DIAGNOSIS — Z681 Body mass index (BMI) 19 or less, adult: Secondary | ICD-10-CM

## 2019-10-03 DIAGNOSIS — I82629 Acute embolism and thrombosis of deep veins of unspecified upper extremity: Secondary | ICD-10-CM | POA: Diagnosis present

## 2019-10-03 DIAGNOSIS — R569 Unspecified convulsions: Secondary | ICD-10-CM

## 2019-10-03 DIAGNOSIS — Z992 Dependence on renal dialysis: Secondary | ICD-10-CM

## 2019-10-03 DIAGNOSIS — I739 Peripheral vascular disease, unspecified: Secondary | ICD-10-CM | POA: Diagnosis present

## 2019-10-03 DIAGNOSIS — K2211 Ulcer of esophagus with bleeding: Secondary | ICD-10-CM | POA: Diagnosis present

## 2019-10-03 DIAGNOSIS — R112 Nausea with vomiting, unspecified: Secondary | ICD-10-CM | POA: Diagnosis not present

## 2019-10-03 DIAGNOSIS — N186 End stage renal disease: Secondary | ICD-10-CM | POA: Diagnosis present

## 2019-10-03 DIAGNOSIS — Z79899 Other long term (current) drug therapy: Secondary | ICD-10-CM

## 2019-10-03 DIAGNOSIS — Z832 Family history of diseases of the blood and blood-forming organs and certain disorders involving the immune mechanism: Secondary | ICD-10-CM

## 2019-10-03 DIAGNOSIS — J40 Bronchitis, not specified as acute or chronic: Secondary | ICD-10-CM

## 2019-10-03 DIAGNOSIS — Z8674 Personal history of sudden cardiac arrest: Secondary | ICD-10-CM

## 2019-10-03 DIAGNOSIS — I132 Hypertensive heart and chronic kidney disease with heart failure and with stage 5 chronic kidney disease, or end stage renal disease: Secondary | ICD-10-CM | POA: Diagnosis present

## 2019-10-03 DIAGNOSIS — R64 Cachexia: Secondary | ICD-10-CM | POA: Diagnosis present

## 2019-10-03 DIAGNOSIS — E785 Hyperlipidemia, unspecified: Secondary | ICD-10-CM | POA: Diagnosis present

## 2019-10-03 DIAGNOSIS — R1110 Vomiting, unspecified: Secondary | ICD-10-CM

## 2019-10-03 DIAGNOSIS — G40909 Epilepsy, unspecified, not intractable, without status epilepticus: Secondary | ICD-10-CM | POA: Diagnosis present

## 2019-10-03 DIAGNOSIS — E8779 Other fluid overload: Secondary | ICD-10-CM | POA: Diagnosis not present

## 2019-10-03 DIAGNOSIS — I959 Hypotension, unspecified: Secondary | ICD-10-CM | POA: Diagnosis present

## 2019-10-03 DIAGNOSIS — F1290 Cannabis use, unspecified, uncomplicated: Secondary | ICD-10-CM | POA: Diagnosis present

## 2019-10-03 DIAGNOSIS — Z7901 Long term (current) use of anticoagulants: Secondary | ICD-10-CM

## 2019-10-03 DIAGNOSIS — N2581 Secondary hyperparathyroidism of renal origin: Secondary | ICD-10-CM | POA: Diagnosis present

## 2019-10-03 DIAGNOSIS — J90 Pleural effusion, not elsewhere classified: Secondary | ICD-10-CM | POA: Diagnosis present

## 2019-10-03 DIAGNOSIS — Z20828 Contact with and (suspected) exposure to other viral communicable diseases: Secondary | ICD-10-CM | POA: Diagnosis present

## 2019-10-03 DIAGNOSIS — Z882 Allergy status to sulfonamides status: Secondary | ICD-10-CM

## 2019-10-03 DIAGNOSIS — F17210 Nicotine dependence, cigarettes, uncomplicated: Secondary | ICD-10-CM | POA: Diagnosis present

## 2019-10-03 DIAGNOSIS — Z833 Family history of diabetes mellitus: Secondary | ICD-10-CM

## 2019-10-03 DIAGNOSIS — I5022 Chronic systolic (congestive) heart failure: Secondary | ICD-10-CM | POA: Diagnosis present

## 2019-10-03 DIAGNOSIS — I82621 Acute embolism and thrombosis of deep veins of right upper extremity: Secondary | ICD-10-CM | POA: Diagnosis present

## 2019-10-03 DIAGNOSIS — K760 Fatty (change of) liver, not elsewhere classified: Secondary | ICD-10-CM | POA: Diagnosis present

## 2019-10-03 DIAGNOSIS — E440 Moderate protein-calorie malnutrition: Secondary | ICD-10-CM | POA: Diagnosis present

## 2019-10-03 DIAGNOSIS — D696 Thrombocytopenia, unspecified: Secondary | ICD-10-CM | POA: Diagnosis present

## 2019-10-03 DIAGNOSIS — Z95820 Peripheral vascular angioplasty status with implants and grafts: Secondary | ICD-10-CM

## 2019-10-03 DIAGNOSIS — Z4659 Encounter for fitting and adjustment of other gastrointestinal appliance and device: Secondary | ICD-10-CM

## 2019-10-03 DIAGNOSIS — J209 Acute bronchitis, unspecified: Secondary | ICD-10-CM | POA: Diagnosis present

## 2019-10-03 DIAGNOSIS — Z885 Allergy status to narcotic agent status: Secondary | ICD-10-CM

## 2019-10-03 DIAGNOSIS — K219 Gastro-esophageal reflux disease without esophagitis: Secondary | ICD-10-CM | POA: Diagnosis present

## 2019-10-03 LAB — BASIC METABOLIC PANEL
Anion gap: 28 — ABNORMAL HIGH (ref 5–15)
BUN: 42 mg/dL — ABNORMAL HIGH (ref 6–20)
CO2: 14 mmol/L — ABNORMAL LOW (ref 22–32)
Calcium: 9.1 mg/dL (ref 8.9–10.3)
Chloride: 90 mmol/L — ABNORMAL LOW (ref 98–111)
Creatinine, Ser: 4.45 mg/dL — ABNORMAL HIGH (ref 0.61–1.24)
GFR calc Af Amer: 17 mL/min — ABNORMAL LOW (ref >60)
GFR calc non Af Amer: 15 mL/min — ABNORMAL LOW (ref >60)
Glucose, Bld: 104 mg/dL — ABNORMAL HIGH (ref 70–99)
Potassium: 4.2 mmol/L (ref 3.5–5.1)
Sodium: 132 mmol/L — ABNORMAL LOW (ref 135–145)

## 2019-10-03 LAB — CBC
HCT: 36.2 % — ABNORMAL LOW (ref 39.0–52.0)
Hemoglobin: 10.7 g/dL — ABNORMAL LOW (ref 13.0–17.0)
MCH: 29.6 pg (ref 26.0–34.0)
MCHC: 29.6 g/dL — ABNORMAL LOW (ref 30.0–36.0)
MCV: 100 fL (ref 80.0–100.0)
Platelets: 118 10*3/uL — ABNORMAL LOW (ref 150–400)
RBC: 3.62 MIL/uL — ABNORMAL LOW (ref 4.22–5.81)
RDW: 19.1 % — ABNORMAL HIGH (ref 11.5–15.5)
WBC: 13.9 10*3/uL — ABNORMAL HIGH (ref 4.0–10.5)
nRBC: 0.4 % — ABNORMAL HIGH (ref 0.0–0.2)

## 2019-10-03 LAB — RESPIRATORY PANEL BY RT PCR (FLU A&B, COVID)
Influenza A by PCR: NEGATIVE
Influenza B by PCR: NEGATIVE
SARS Coronavirus 2 by RT PCR: NEGATIVE

## 2019-10-03 LAB — LACTIC ACID, PLASMA: Lactic Acid, Venous: 4.5 mmol/L (ref 0.5–1.9)

## 2019-10-03 LAB — CK: Total CK: 33 U/L — ABNORMAL LOW (ref 49–397)

## 2019-10-03 LAB — TROPONIN I (HIGH SENSITIVITY): Troponin I (High Sensitivity): 53 ng/L — ABNORMAL HIGH (ref <18)

## 2019-10-03 MED ORDER — CHLORHEXIDINE GLUCONATE CLOTH 2 % EX PADS
6.0000 | MEDICATED_PAD | Freq: Every day | CUTANEOUS | Status: DC
  Administered 2019-10-07 – 2019-10-08 (×2): 6 via TOPICAL
  Filled 2019-10-03: qty 6

## 2019-10-03 MED ORDER — HEPARIN SODIUM (PORCINE) 1000 UNIT/ML DIALYSIS: 20.0000 [IU]/kg | INTRAMUSCULAR | Status: DC | PRN

## 2019-10-03 MED ORDER — ACETAMINOPHEN 325 MG PO TABS
650.0000 mg | ORAL_TABLET | Freq: Four times a day (QID) | ORAL | Status: DC | PRN
  Administered 2019-10-05 – 2019-10-06 (×2): 650 mg via ORAL
  Filled 2019-10-03 (×2): qty 2

## 2019-10-03 MED ORDER — LEVOFLOXACIN 500 MG PO TABS: 500.0000 mg | ORAL_TABLET | Freq: Once | ORAL | Status: DC

## 2019-10-03 MED ORDER — RENA-VITE PO TABS: 1.0000 | ORAL_TABLET | Freq: Every day | ORAL | Status: DC

## 2019-10-03 MED ORDER — BACITRACIN-NEOMYCIN-POLYMYXIN OINTMENT TUBE
1.0000 "application " | TOPICAL_OINTMENT | CUTANEOUS | Status: DC | PRN
  Filled 2019-10-03: qty 14.17

## 2019-10-03 MED ORDER — ONDANSETRON HCL 4 MG PO TABS: 4.0000 mg | ORAL_TABLET | Freq: Four times a day (QID) | ORAL | Status: DC | PRN

## 2019-10-03 MED ORDER — LEVOFLOXACIN 250 MG PO TABS
250.0000 mg | ORAL_TABLET | ORAL | Status: DC
  Filled 2019-10-03: qty 1

## 2019-10-03 MED ORDER — HYDROXYZINE HCL 25 MG PO TABS: 25.0000 mg | ORAL_TABLET | Freq: Three times a day (TID) | ORAL | Status: DC | PRN

## 2019-10-03 MED ORDER — RENA-VITE PO TABS
1.0000 | ORAL_TABLET | Freq: Every day | ORAL | Status: DC
  Administered 2019-10-03 – 2019-10-08 (×5): 1 via ORAL
  Filled 2019-10-03 (×5): qty 1

## 2019-10-03 MED ORDER — EPOETIN ALFA 4000 UNIT/ML IJ SOLN
4000.0000 [IU] | Freq: Once | INTRAMUSCULAR | Status: AC
  Administered 2019-10-03: 4000 [IU] via INTRAVENOUS
  Filled 2019-10-03: qty 1

## 2019-10-03 MED ORDER — SEVELAMER CARBONATE 800 MG PO TABS
1600.0000 mg | ORAL_TABLET | Freq: Three times a day (TID) | ORAL | Status: DC
  Administered 2019-10-04 – 2019-10-09 (×6): 1600 mg via ORAL
  Filled 2019-10-03 (×12): qty 2

## 2019-10-03 MED ORDER — LEVETIRACETAM 500 MG PO TABS
500.0000 mg | ORAL_TABLET | Freq: Two times a day (BID) | ORAL | Status: DC
  Administered 2019-10-03 – 2019-10-04 (×2): 500 mg via ORAL
  Filled 2019-10-03 (×2): qty 1

## 2019-10-03 MED ORDER — APIXABAN 2.5 MG PO TABS
2.5000 mg | ORAL_TABLET | Freq: Two times a day (BID) | ORAL | Status: DC
  Administered 2019-10-03 – 2019-10-04 (×2): 2.5 mg via ORAL
  Filled 2019-10-03 (×2): qty 1

## 2019-10-03 MED ORDER — ATORVASTATIN CALCIUM 20 MG PO TABS
80.0000 mg | ORAL_TABLET | Freq: Every day | ORAL | Status: DC
  Administered 2019-10-03 – 2019-10-08 (×5): 80 mg via ORAL
  Filled 2019-10-03 (×5): qty 4

## 2019-10-03 MED ORDER — ACETAMINOPHEN 650 MG RE SUPP: 650.0000 mg | Freq: Four times a day (QID) | RECTAL | Status: DC | PRN

## 2019-10-03 MED ORDER — ONDANSETRON HCL 4 MG/2ML IJ SOLN
4.0000 mg | Freq: Four times a day (QID) | INTRAMUSCULAR | Status: DC | PRN
  Administered 2019-10-03 – 2019-10-07 (×4): 4 mg via INTRAVENOUS
  Filled 2019-10-03 (×5): qty 2

## 2019-10-03 MED ORDER — SODIUM CHLORIDE 0.9% FLUSH: 3.0000 mL | Freq: Once | INTRAVENOUS | Status: DC

## 2019-10-03 MED ORDER — HYDRALAZINE HCL 25 MG PO TABS
25.0000 mg | ORAL_TABLET | Freq: Three times a day (TID) | ORAL | Status: DC
  Administered 2019-10-03 – 2019-10-04 (×2): 25 mg via ORAL
  Filled 2019-10-03 (×3): qty 1

## 2019-10-03 MED ORDER — LORATADINE 10 MG PO TABS
10.0000 mg | ORAL_TABLET | Freq: Every day | ORAL | Status: DC
  Administered 2019-10-04 – 2019-10-09 (×4): 10 mg via ORAL
  Filled 2019-10-03 (×5): qty 1

## 2019-10-03 MED ORDER — NEPRO/CARBSTEADY PO LIQD
237.0000 mL | Freq: Two times a day (BID) | ORAL | Status: DC
  Administered 2019-10-04: 237 mL via ORAL

## 2019-10-03 MED ORDER — GABAPENTIN 100 MG PO CAPS
100.0000 mg | ORAL_CAPSULE | Freq: Three times a day (TID) | ORAL | Status: DC
  Administered 2019-10-03 – 2019-10-08 (×12): 100 mg via ORAL
  Filled 2019-10-03 (×14): qty 1

## 2019-10-03 MED ORDER — CARVEDILOL 25 MG PO TABS
25.0000 mg | ORAL_TABLET | Freq: Two times a day (BID) | ORAL | Status: DC
  Administered 2019-10-04: 25 mg via ORAL
  Filled 2019-10-03: qty 1

## 2019-10-03 NOTE — ED Triage Notes (Signed)
Chest pressure since yesterday. States has had cough for one week. Was tested for COVID yesterday but does not have result. DIalysis patient with permacath R upper chest.

## 2019-10-03 NOTE — ED Notes (Signed)
Report given to dialysis.   

## 2019-10-03 NOTE — Progress Notes (Signed)
Pre HD Tx Assessment   10/03/19 1645  Neurological  Level of Consciousness Alert  Orientation Level Oriented X4  Respiratory  Respiratory Pattern Regular;Unlabored  Chest Assessment Chest expansion symmetrical  Bilateral Breath Sounds Fine crackles  Cough Non-productive;Strong  Cardiac  Pulse Irregular  Heart Sounds S1, S2  Jugular Venous Distention (JVD) No  ECG Monitor Yes  Cardiac Rhythm ST  Antiarrhythmic device No  Vascular  R Radial Pulse +2  L Radial Pulse +2  Integumentary  Integumentary (WDL) X  Skin Color Appropriate for ethnicity  Skin Condition Dry  Musculoskeletal  Musculoskeletal (WDL) X  Generalized Weakness Yes  Gastrointestinal  Bowel Sounds Assessment Active  Last BM Date 10/03/19  GU Assessment  Genitourinary (WDL) X  Genitourinary Symptoms Oliguria (HD pt)  Psychosocial  Psychosocial (WDL) WDL

## 2019-10-03 NOTE — ED Notes (Signed)
Pt given cup of ice chips

## 2019-10-03 NOTE — ED Notes (Signed)
Nephrology at bedside

## 2019-10-03 NOTE — Consult Note (Signed)
Pharmacy Antibiotic Note  Brett Small is a 43 y.o. male with ESRD on HD admitted on 10/03/2019 with bronchitis.    Pharmacy has been consulted for Levaquin dosing.  Plan: Pt received 500mg  dose of Levaquin in the ED, will follow with 250mg  q 48h  Height: 5\' 1"  (154.9 cm) Weight: 98 lb (44.5 kg) IBW/kg (Calculated) : 52.3  Temp (24hrs), Avg:97.8 F (36.6 C), Min:97.8 F (36.6 C), Max:97.8 F (36.6 C)  Recent Labs  Lab 10/03/19 1333  WBC 13.9*  CREATININE 4.45*    Estimated Creatinine Clearance: 13.2 mL/min (A) (by C-G formula based on SCr of 4.45 mg/dL (H)).    Allergies  Allergen Reactions  . Codeine Nausea Only    Patient questioned this (??)  . Sulfa Antibiotics Hives and Nausea And Vomiting    Antimicrobials this admission: Levaquin 12/12 >>  Dose adjustments this admission: None  Microbiology results:  COVID/FLU NEG  Thank you for allowing pharmacy to be a part of this patient's care.  Lu Duffel, PharmD, BCPS Clinical Pharmacist 10/03/2019 4:00 PM

## 2019-10-03 NOTE — Progress Notes (Signed)
Post HD Note    10/03/19 2015  Hand-Off documentation  Report given to (Full Name) Thayer Headings RN   Report received from (Full Name) Newt Minion RN  Vital Signs  Temp 98.4 F (36.9 C)  Temp Source Oral  Pulse Rate (!) 104  Pulse Rate Source Monitor  Resp (!) 23  BP (!) 146/100  BP Location Left Leg  BP Method Automatic  Patient Position (if appropriate) Lying  Oxygen Therapy  SpO2 100 %  O2 Device Nasal Cannula  O2 Flow Rate (L/min) 2 L/min  Pain Assessment  Pain Scale 0-10  Pain Score 0  Post-Hemodialysis Assessment  Rinseback Volume (mL) 250 mL  KECN 60.2 V  Dialyzer Clearance Lightly streaked  Duration of HD Treatment -hour(s) 3 hour(s)  Hemodialysis Intake (mL) 500 mL  UF Total -Machine (mL) 2500 mL  Net UF (mL) 2000 mL  Tolerated HD Treatment Yes  Hemodialysis Catheter Right Internal jugular Double lumen Temporary (Non-Tunneled)  No Placement Date or Time found.   Placed prior to admission: Yes  Orientation: Right  Access Location: Internal jugular  Hemodialysis Catheter Type: Double lumen Temporary (Non-Tunneled)  Site Condition No complications  Blue Lumen Status Heparin locked  Red Lumen Status Heparin locked  Purple Lumen Status N/A  Catheter fill solution Heparin 1000 units/ml  Catheter fill volume (Arterial) 2 cc  Catheter fill volume (Venous) 2.2  Dressing Type Biopatch  Dressing Status Clean;Dry;Intact  Interventions New dressing;Dressing changed  Drainage Description None  Dressing Change Due 10/07/19  Post treatment catheter status Capped and Clamped

## 2019-10-03 NOTE — ED Notes (Signed)
Lab at bedside for blood work.

## 2019-10-03 NOTE — Progress Notes (Signed)
HD Tx Completed    10/03/19 2000  Vital Signs  Pulse Rate (!) 104  BP (!) 163/110  Oxygen Therapy  SpO2 100 %  O2 Device Nasal Cannula  O2 Flow Rate (L/min) 2 L/min  Pain Assessment  Pain Scale 0-10  Pain Score 0  During Hemodialysis Assessment  Blood Flow Rate (mL/min) 300 mL/min  Arterial Pressure (mmHg) -80 mmHg  Venous Pressure (mmHg) 360 mmHg  Transmembrane Pressure (mmHg) 90 mmHg  Ultrafiltration Rate (mL/min) 840 mL/min  Dialysate Flow Rate (mL/min) 600 ml/min  Conductivity: Machine  14  HD Safety Checks Performed Yes  Intra-Hemodialysis Comments Tx completed

## 2019-10-03 NOTE — Progress Notes (Signed)
[place], [place] 10/03/19  Subjective:   Hospital day # 0 Patient presents today with the emergency room for nausea, unable to keep anything down since yesterday. Also complains of cough and congestion along with some shortness of breath. Denies any fever. Work-up so far in the emergency room shows COVID-19 test is negative Chest x-ray with moderate left pleural effusion, possible left lung base opacity   Objective:  Vital signs in last 24 hours:  Temp:  [97.8 F (36.6 C)] 97.8 F (36.6 C) (12/12 1149) Pulse Rate:  [105-106] 106 (12/12 1353) Resp:  [20] 20 (12/12 1149) BP: (133-177)/(107-116) 133/107 (12/12 1352) SpO2:  [100 %] 100 % (12/12 1353) Weight:  [44.5 kg] 44.5 kg (12/12 1145)  Weight change:  Filed Weights   10/03/19 1145  Weight: 44.5 kg    Intake/Output:   No intake or output data in the 24 hours ending 10/03/19 1554   Physical Exam: General:  Chronically ill-appearing, laying in the bed  HEENT  anicteric, moist oral mucous membranes  Pulm/lungs  mild crackles at bases  CVS/Heart  no rub or gallop  Abdomen:   Soft, nontender  Extremities:  1+ edema left leg  Neurologic:  Alert, able to answer questions  Skin:  No acute rashes  Access:  Right IJ PermCath       Basic Metabolic Panel:  Recent Labs  Lab 10/03/19 1333  NA 132*  K 4.2  CL 90*  CO2 14*  GLUCOSE 104*  BUN 42*  CREATININE 4.45*  CALCIUM 9.1     CBC: Recent Labs  Lab 10/03/19 1333  WBC 13.9*  HGB 10.7*  HCT 36.2*  MCV 100.0  PLT 118*      Lab Results  Component Value Date   HEPBSAG NON REACTIVE 08/03/2019   HEPBSAB Non Reactive 12/25/2018   HEPBIGM Negative 05/19/2019      Microbiology:  Recent Results (from the past 240 hour(s))  Respiratory Panel by RT PCR (Flu A&B, Covid) - Nasopharyngeal Swab     Status: None   Collection Time: 10/03/19  1:38 PM   Specimen: Nasopharyngeal Swab  Result Value Ref Range Status   SARS  Coronavirus 2 by RT PCR NEGATIVE NEGATIVE Final    Comment: (NOTE) SARS-CoV-2 target nucleic acids are NOT DETECTED. The SARS-CoV-2 RNA is generally detectable in upper respiratoy specimens during the acute phase of infection. The lowest concentration of SARS-CoV-2 viral copies this assay can detect is 131 copies/mL. A negative result does not preclude SARS-Cov-2 infection and should not be used as the sole basis for treatment or other patient management decisions. A negative result may occur with  improper specimen collection/handling, submission of specimen other than nasopharyngeal swab, presence of viral mutation(s) within the areas targeted by this assay, and inadequate number of viral copies (<131 copies/mL). A negative result must be combined with clinical observations, patient history, and epidemiological information. The expected result is Negative. Fact Sheet for Patients:  PinkCheek.be Fact Sheet for Healthcare Providers:  GravelBags.it This test is not yet ap proved or cleared by the [place] FDA and  has been authorized for detection and/or diagnosis of SARS-CoV-2 by FDA under an Emergency Use Authorization (EUA). This EUA will remain  in effect (meaning this test can be used) for the duration of the COVID-19 declaration under Section 564(b)(1) of the Act, 21 U.S.C. section 360bbb-3(b)(1), unless the authorization is terminated or revoked sooner.    Influenza A by PCR NEGATIVE NEGATIVE Final  Influenza B by PCR NEGATIVE NEGATIVE Final    Comment: (NOTE) The Xpert Xpress SARS-CoV-2/FLU/RSV assay is intended as an aid in  the diagnosis of influenza from Nasopharyngeal swab specimens and  should not be used as a sole basis for treatment. Nasal washings and  aspirates are unacceptable for Xpert Xpress SARS-CoV-2/FLU/RSV  testing. Fact Sheet for Patients: PinkCheek.be Fact Sheet  for Healthcare Providers: GravelBags.it This test is not yet approved or cleared by the [place] FDA and  has been authorized for detection and/or diagnosis of SARS-CoV-2 by  FDA under an Emergency Use Authorization (EUA). This EUA will remain  in effect (meaning this test can be used) for the duration of the  Covid-19 declaration under Section 564(b)(1) of the Act, 21  U.S.C. section 360bbb-3(b)(1), unless the authorization is  terminated or revoked. Performed at [facility], [address] [place], [place] [address]     Coagulation Studies: No results for input(s): LABPROT, INR in the last 72 hours.  Urinalysis: No results for input(s): COLORURINE, LABSPEC, PHURINE, GLUCOSEU, HGBUR, BILIRUBINUR, KETONESUR, PROTEINUR, UROBILINOGEN, NITRITE, LEUKOCYTESUR in the last 72 hours.  Invalid input(s): APPERANCEUR    Imaging: DG Chest 2 View  Result Date: 10/03/2019 CLINICAL DATA:  Chest pressure since yesterday. States has had cough for one week. Was tested for COVID yesterday but does not have result. DIalysis patient with permacath R upper chest. Hx - ESRD, HTN, AAA, current smoker 0.5 ppd. EXAM: CHEST - 2 VIEW COMPARISON:  08/25/2019.  CT, 07/20/2019. FINDINGS: Cardiac silhouette is mildly enlarged. No mediastinal or hilar masses. Right internal jugular tunneled dual lumen central venous catheter is stable, distal tip in the right atrium. Moderate left pleural effusion associated with dependent left lung base opacity, the latter most likely atelectasis although pneumonia is possible. Lungs show prominent bronchovascular markings bilaterally, but are otherwise clear. No pulmonary edema. No pneumothorax. Skeletal structures are grossly intact. Stable left axillary to upper extremity vascular stent. IMPRESSION: 1. Moderate right pleural effusion, which appears decreased compared to the prior chest radiograph. 2. Left lung base opacity, which is most  likely atelectasis. Pneumonia is not excluded but felt less likely. 3. No evidence of pulmonary edema.  Mild cardiomegaly. Electronically Signed   By: Lajean Manes M.D.   On: 10/03/2019 12:58     Medications:    . [START ON 10/04/2019] Chlorhexidine Gluconate Cloth  6 each Topical Q0600  . levofloxacin  500 mg Oral Once  . sodium chloride flush  3 mL Intravenous Once   acetaminophen **OR** acetaminophen, ondansetron **OR** ondansetron (ZOFRAN) IV  Assessment/ Plan:  43 y.o. male with end-stage renal disease on hemodialysis, hypertension, peripheral vascular disease, depression, history of PEA event October 2020, current smoker  [facility] [address]/TTS/9 IJ catheter  1. End-stage renal disease -Patient's last hemodialysis was on Thursday -We'll arrange for hemodialysis today. Dialysis nurse has been notified. -Patient does have some lower extremity edema -We'll arrange for fluid removal as tolerated  2. Cough/congestion, shortness of breath -Patient is a current smoker -May have bronchitis with left lung opacity possibly early pneumonia -COVID-19 PCR negative -Can consider admission for observation  3. Anemia of chronic kidney disease Hemoglobin 10.7 -Low-dose Epogen with dialysis     LOS: 0 Marilynn Ekstein 12/12/20203:54 PM  [place], [place]  Note: This note was prepared with Dragon dictation. Any transcription errors are unintentional

## 2019-10-03 NOTE — ED Provider Notes (Signed)
[facility] Emergency Department Provider Note   ____________________________________________   First MD Initiated Contact with Patient 10/03/19 1307     (approximate)  I have reviewed the triage vital signs and the nursing notes.   HISTORY  Chief Complaint Chest Pain    HPI Mattingly Kilkenny is a 43 y.o. male here for evaluation of cough and some shortness of breath with walking  Patient tells me he has been sick for about 8 days with a dry hacking cough and some slight shortness of breath.  He is continued his dialysis and had dialysis Thursday.  He continues to note that he feels short of breath with walking, and had a coronavirus test yesterday at an urgent care but was told the test would not come back for a few days.  He is supposed to be a dialysis today, but did not attend, goes to [employer].  Denies fevers.  Having some muscle aches especially in his calfs off and on  No chest pain, just some shortness of breath specially when he is up walking about or exerting himself   Past Medical History:  Diagnosis Date  . ESRD (end stage renal disease) ([place])   . Hypertension   . PAD (peripheral artery disease) (HCC)    Required aortofemoral stent-which had closed and had to redo the procedure and ischemia of limb.  . Peripheral vascular disease ([place])   . Renal disorder   . Secondary hyperparathyroidism of renal origin [facility])     Patient Active Problem List   Diagnosis Date Noted  . Tobacco abuse 08/24/2019  . Marijuana use 08/24/2019  . PVD (peripheral vascular disease) ([place]) 08/24/2019  . Wound dehiscence, surgical, sequela 08/24/2019  . Chronic systolic CHF (congestive heart failure) ([place]) 08/24/2019  . Volume overload 08/24/2019  . Hyperkalemia 08/24/2019  . Complications due to renal dialysis device, implant, and graft 08/24/2019  . Seizure ([place])   . Malnutrition of moderate degree 08/05/2019  . Acute CHF (congestive heart failure) ([place])  08/04/2019  . Pulmonary edema 07/27/2019  . Pleural effusion 07/20/2019  . NSTEMI (non-ST elevated myocardial infarction) ([place]) 07/09/2019  . Pneumonia 07/02/2019  . Acute liver failure 05/19/2019  . Hematemesis 05/19/2019  . Hepatitis   . Thrombocytopenia ([place])   . Coagulopathy ([place])   . Sepsis ([place]) 02/28/2019  . Lobar pneumonia ([place]) 02/28/2019  . Acute respiratory failure ([place]) 02/04/2019  . Acute respiratory failure with hypoxemia ([place]) 01/27/2019  . Depression 01/07/2019  . MDD (major depressive disorder), single episode, severe , no psychosis ([place])   . Homelessness   . Acute respiratory failure with hypoxia ([place]) 12/25/2018  . End stage renal disease on dialysis ([place]) 12/25/2018  . Hypertension 12/25/2018  . Renal osteodystrophy 12/25/2018  . Anemia 10/01/2018  . GERD (gastroesophageal reflux disease) 10/01/2018  . Malnutrition ([place]) 09/30/2018  . ESRD on hemodialysis ([place]) 09/15/2018  . HFrEF (heart failure with reduced ejection fraction) ([place]) 09/15/2018  . HLD (hyperlipidemia) 09/15/2018  . Secondary hyperparathyroidism ([place]) 08/18/2018  . Hx of fasciotomy 07/09/2018    Past Surgical History:  Procedure Laterality Date  . A/V SHUNT INTERVENTION Left 01/19/2019   Procedure: LEFT UPPER EXTREMITY A/V SHUNTOGRAM / UPPER EXTREMITY ANGIOGRAM;  Surgeon: Algernon Huxley, MD;  Location: [place] [facility];  Service: Cardiovascular;  Laterality: Left;  . A/V SHUNT INTERVENTION Left 03/02/2019   Procedure: A/V SHUNT INTERVENTION;  Surgeon: Algernon Huxley, MD;  Location: [place] [facility];  Service: Cardiovascular;  Laterality: Left;  .  AORTA - FEMORAL ARTERY BYPASS GRAFT    . AV FISTULA PLACEMENT Left 12/26/2018   Procedure: INSERTION OF GORE STRETCH VASCULAR 4-7MM X  45CM IN LEFT UPPER ARM;  Surgeon: Marty Heck, MD;  Location: [place];  Service: Vascular;  Laterality: Left;  . AV FISTULA PLACEMENT Right 08/12/2019   Procedure: INSERTION OF ARTERIOVENOUS (AV) GORE-TEX  GRAFT ARM;  Surgeon: Algernon Huxley, MD;  Location: [facility];  Service: Vascular;  Laterality: Right;  . DIALYSIS/PERMA CATHETER REMOVAL N/A 02/06/2019   Procedure: DIALYSIS/PERMA CATHETER REMOVAL;  Surgeon: Algernon Huxley, MD;  Location: [place] [facility];  Service: Cardiovascular;  Laterality: N/A;  . LEFT HEART CATH AND CORONARY ANGIOGRAPHY Right 07/10/2019   Procedure: LEFT HEART CATH AND CORONARY ANGIOGRAPHY;  Surgeon: Dionisio David, MD;  Location: [place] [facility];  Service: Cardiovascular;  Laterality: Right;  . PERIPHERAL VASCULAR THROMBECTOMY Left 07/28/2019   Procedure: Left Upper Extremity Dialysis Access [place];  Surgeon: Katha Cabal, MD;  Location: [place] [facility];  Service: Cardiovascular;  Laterality: Left;  . REMOVAL OF A DIALYSIS CATHETER Right 08/26/2019   Procedure: REMOVAL OF A DIALYSIS CATHETER;  Surgeon: Algernon Huxley, MD;  Location: [facility];  Service: Vascular;  Laterality: Right;  . TEMPORARY DIALYSIS CATHETER N/A 08/25/2019   Procedure: TEMPORARY DIALYSIS CATHETER;  Surgeon: Katha Cabal, MD;  Location: [place] [facility];  Service: Cardiovascular;  Laterality: N/A;  . UPPER EXTREMITY ANGIOGRAPHY Left 08/13/2019   Procedure: UPPER EXTREMITY ANGIOGRAPHy;  Surgeon: Algernon Huxley, MD;  Location: [place] [facility];  Service: Cardiovascular;  Laterality: Left;    Prior to Admission medications   Medication Sig Start Date End Date Taking? Authorizing Provider  amLODipine (NORVASC) 5 MG tablet Take 1 tablet (5 mg total) by mouth daily. 08/28/19   Danford, Suann Larry, MD  apixaban (ELIQUIS) 2.5 MG TABS tablet Take 1 tablet (2.5 mg total) by mouth 2 (two) times daily. 01/26/19   Clapacs, Madie Reno, MD  atorvastatin (LIPITOR) 80 MG tablet Take 1 tablet (80 mg total) by mouth at bedtime. 01/26/19   Clapacs, Madie Reno, MD  b complex-C-folic acid 1 MG capsule Take 1 capsule by mouth daily after supper.     [provider]  carvedilol (COREG) 25 MG tablet Take 1  tablet (25 mg total) by mouth 2 (two) times daily with a meal. 01/26/19   Clapacs, Madie Reno, MD  gabapentin (NEURONTIN) 100 MG capsule Take 1 capsule (100 mg total) by mouth 3 (three) times daily. 01/26/19   Clapacs, Madie Reno, MD  hydrALAZINE (APRESOLINE) 25 MG tablet Take 1 tablet (25 mg total) by mouth every 8 (eight) hours. 01/26/19   Clapacs, Madie Reno, MD  hydrOXYzine (ATARAX/VISTARIL) 25 MG tablet Take 25 mg by mouth 3 (three) times daily as needed for anxiety.    [provider]  levETIRAcetam (KEPPRA) 500 MG tablet Take 1 tablet (500 mg total) by mouth 2 (two) times daily. 08/17/19   Mayo, Pete Pelt, MD  levofloxacin (LEVAQUIN) 500 MG tablet Take 1 tablet (500 mg total) by mouth every other day. 08/29/19   Danford, Suann Larry, MD  loratadine (CLARITIN) 10 MG tablet Take 1 tablet (10 mg total) by mouth daily. 03/04/19   Salary, Holly Bodily D, MD  multivitamin (RENA-VIT) TABS tablet Take 1 tablet by mouth daily. 01/27/19   Clapacs, Madie Reno, MD  neomycin-bacitracin-polymyxin (NEOSPORIN) OINT Apply 1 application topically 2 (two) times daily. Patient taking differently: Apply 1 application  topically as needed.  01/26/19   Clapacs, Madie Reno, MD  Nutritional Supplements (FEEDING SUPPLEMENT, NEPRO CARB STEADY,) LIQD Take 237 mLs by mouth 2 (two) times daily between meals. 07/06/19   Epifanio Lesches, MD  sevelamer carbonate (RENVELA) 800 MG tablet Take 2 tablets (1,600 mg total) by mouth 3 (three) times daily with meals. 01/26/19   Clapacs, Madie Reno, MD    Allergies Codeine and Sulfa antibiotics  Family History  Problem Relation Age of Onset  . Hypertension Other   . Diabetes Other   . Clotting disorder Father     Social History Social History   Tobacco Use  . Smoking status: Current Every Day Smoker    Packs/day: 0.50    Years: 30.00    Pack years: 15.00    Types: Cigarettes    Last attempt to quit: 06/26/2018    Years since quitting: 1.2  . Smokeless tobacco: Never Used  . Tobacco comment:  smoked for 30 years   Substance Use Topics  . Alcohol use: Not Currently  . Drug use: Yes    Frequency: 1.0 times per week    Types: Marijuana    Comment: Pt states "maybe once or twice a week".     Review of Systems Constitutional: No fever/chills Eyes: No visual changes. ENT: No sore throat. Cardiovascular: Denies chest pain. Respiratory: See HPI Gastrointestinal: No abdominal pain.   Genitourinary: Negative for dysuria. Musculoskeletal: Negative for back pain. Neurological: Negative for headache    ____________________________________________   PHYSICAL EXAM:  VITAL SIGNS: ED Triage Vitals  Enc Vitals Group     BP 10/03/19 1149 (!) 177/116     Pulse Rate 10/03/19 1149 (!) 105     Resp 10/03/19 1149 20     Temp 10/03/19 1149 97.8 F (36.6 C)     Temp Source 10/03/19 1149 Oral     SpO2 10/03/19 1149 100 %     Weight 10/03/19 1145 98 lb (44.5 kg)     Height 10/03/19 1145 5\' 1"  (1.549 m)     Head Circumference --      Peak Flow --      Pain Score 10/03/19 1145 7     Pain Loc --      Pain Edu? --      Excl. in [place]? --     Constitutional: Alert and oriented. Well appearing and in no acute distress.  Pleasant.  Chronic illness appearance. Eyes: Conjunctivae are normal. Head: Atraumatic. Nose: No congestion/rhinnorhea. Mouth/Throat: Mucous membranes are moist. Neck: No stridor.  Cardiovascular: Normal rate, regular rhythm. Grossly normal heart sounds.  Good peripheral circulation. Respiratory: Normal respiratory effort.  No retractions. Lungs CTAB for slightly diminished over the right base.  Occasional dry cough. Gastrointestinal: Soft and nontender. No distention. Musculoskeletal: No lower extremity tenderness some slight edema Neurologic:  Normal speech and language. No gross focal neurologic deficits are appreciated.  Skin:  Skin is warm, dry and intact. No rash noted. Psychiatric: Mood and affect are normal. Speech and behavior are  normal.  ____________________________________________   LABS (all labs ordered are listed, but only abnormal results are displayed)  Labs Reviewed  BASIC METABOLIC PANEL - Abnormal; Notable for the following components:      Result Value   Sodium 132 (*)    Chloride 90 (*)    CO2 14 (*)    Glucose, Bld 104 (*)    BUN 42 (*)    Creatinine, Ser 4.45 (*)    GFR calc  non Af Amer 15 (*)    GFR calc Af Amer 17 (*)    Anion gap 28 (*)    All other components within normal limits  CBC - Abnormal; Notable for the following components:   WBC 13.9 (*)    RBC 3.62 (*)    Hemoglobin 10.7 (*)    HCT 36.2 (*)    MCHC 29.6 (*)    RDW 19.1 (*)    Platelets 118 (*)    nRBC 0.4 (*)    All other components within normal limits  CK - Abnormal; Notable for the following components:   Total CK 33 (*)    All other components within normal limits  TROPONIN I (HIGH SENSITIVITY) - Abnormal; Notable for the following components:   Troponin I (High Sensitivity) 53 (*)    All other components within normal limits  RESPIRATORY PANEL BY RT PCR (FLU A&B, COVID)  LACTIC ACID, PLASMA  LACTIC ACID, PLASMA   ____________________________________________  EKG  Reviewed inter by me at 1150 Heart rate 110 QRS 80 QTc 500 Sinus tachycardia, no evidence of acute ischemia. ____________________________________________  RADIOLOGY  DG Chest 2 View  Result Date: 10/03/2019 CLINICAL DATA:  Chest pressure since yesterday. States has had cough for one week. Was tested for COVID yesterday but does not have result. DIalysis patient with permacath R upper chest. Hx - ESRD, HTN, AAA, current smoker 0.5 ppd. EXAM: CHEST - 2 VIEW COMPARISON:  08/25/2019.  CT, 07/20/2019. FINDINGS: Cardiac silhouette is mildly enlarged. No mediastinal or hilar masses. Right internal jugular tunneled dual lumen central venous catheter is stable, distal tip in the right atrium. Moderate left pleural effusion associated with dependent  left lung base opacity, the latter most likely atelectasis although pneumonia is possible. Lungs show prominent bronchovascular markings bilaterally, but are otherwise clear. No pulmonary edema. No pneumothorax. Skeletal structures are grossly intact. Stable left axillary to upper extremity vascular stent. IMPRESSION: 1. Moderate right pleural effusion, which appears decreased compared to the prior chest radiograph. 2. Left lung base opacity, which is most likely atelectasis. Pneumonia is not excluded but felt less likely. 3. No evidence of pulmonary edema.  Mild cardiomegaly. Electronically Signed   By: Lajean Manes M.D.   On: 10/03/2019 12:58    Imaging reviewed, right pleural effusion decreased from previous.  Probable left lung atelectasis. PNA is not excluded  ____________________________________________   PROCEDURES  Procedure(s) performed: None  Procedures  Critical Care performed: No  ____________________________________________   INITIAL IMPRESSION / ASSESSMENT AND PLAN / ED COURSE  Pertinent labs & imaging results that were available during my care of the patient were reviewed by me and considered in my medical decision making (see chart for details).   Patient end-stage renal patient, presents for evaluation of dry cough for a little over a week now pending Covid test, and dyspnea with exertion.  Overall he appears hemodynamically well except for hypertension with elevated diastolic as well as slight tachycardia.  I have sent a 2-hour Covid test, discussed this case with nephrology who would prefer patient receive hemodialysis at our facility today if negative Covid test.  Patient is agreeable and comfortable with this plan, likely with his diastolic hypertension and his symptomatology being exertional in his previous history this likely represents volume overload as a cause.  His EKG appears reassuring without evidence of acute ischemia.    He is afebrile, denies productive  sputum, reports dry cough for a little over a week.  I do not  see convincing evidence of pneumonia at this time, likely symptomatology with exertional symptoms without fever probably best explained by volume overload state.  Denies chest pain     ----------------------------------------- 3:42 PM on 10/03/2019 -----------------------------------------  Patient seen in the ER by nephrologist, recommends the patient be admitted for observation and hemodialysis session, Dr. Candiss Norse also recommends initiating Levaquin in consideration for possible bronchitis or community-acquired pneumonia.  Patient agreeable and comfortable with plan for admission as recommended by nephrology services ____________________________________________   FINAL CLINICAL IMPRESSION(S) / ED DIAGNOSES  Final diagnoses:  Bronchitis  ESRD (end stage renal disease) ([place])  Volume overload state of heart        Note:  This document was prepared using Dragon voice recognition software and may include unintentional dictation errors       Delman Kitten, MD 10/03/19 1543

## 2019-10-03 NOTE — ED Notes (Addendum)
Dr. Quale at bedside.  

## 2019-10-03 NOTE — Plan of Care (Signed)
  Problem: Clinical Measurements: Goal: Complications related to the disease process, condition or treatment will be avoided or minimized Outcome: Progressing   Problem: Fluid Volume: Goal: Compliance with measures to maintain balanced fluid volume will improve Outcome: Progressing  PT TOLERATED WELL THE tX. PT REQUESTED 2L O2 PER [place] during dialysis. Arterial Pressure was high during Tx. BFR could not exceed 350.

## 2019-10-03 NOTE — Progress Notes (Signed)
Pre HD Note    10/03/19 1630  Hand-Off documentation  Report given to (Full Name) Newt Minion RN   Report received from (Full Name) [facility]der ED RN   Vital Signs  Temp 98.8 F (37.1 C)  Temp Source Oral  Pulse Rate (!) 103  Pulse Rate Source Monitor  Resp 19  BP (!) 130/103  BP Location Left Leg  BP Method Automatic  Patient Position (if appropriate) Lying  Oxygen Therapy  SpO2 100 %  O2 Device Nasal Cannula  O2 Flow Rate (L/min) 2 L/min  Pain Assessment  Pain Scale 0-10  Pain Score 0  Dialysis Weight  Weight 44.5 kg  Type of Weight Pre-Dialysis  Time-Out for Hemodialysis  What Procedure? HD   Pt Identifiers(min of two) MRN/Account#;First/Last Name  Correct Site? Yes  Correct Side? Yes  Correct Procedure? Yes  Consents Verified? Yes  Safety Precautions Reviewed? Yes  Engineer, civil (consulting) Number 6  Station Number 3  UF/Alarm Test Passed  Conductivity: Meter 13.8  Conductivity: Machine  14.1  pH 7.4  Reverse Osmosis  (Main)  Normal Saline Lot Number [id number]  Dialyzer Lot Number [id number]  Disposable Set Lot Number [id number]  Machine Temperature 99.5 F (37.5 C)  Musician and Audible Yes  Blood Lines Intact and Secured Yes  Pre Treatment Patient Checks  Vascular access used during treatment Catheter  HD catheter dressing before treatment WDL  Patient is receiving dialysis in a chair  (in bed)  Hepatitis B Surface Antigen Results Pending  Isolation Initiated Yes  Hepatitis B Surface Antibody  (pending)  Date Hepatitis B Surface Antibody Drawn 10/03/19  Hemodialysis Consent Verified Yes  Hemodialysis Standing Orders Initiated Yes  ECG (Telemetry) Monitor On Yes  Prime Ordered Normal Saline  Length of  DialysisTreatment -hour(s) 3 Hour(s)  Dialysis Treatment Comments  (Na140)  Dialyzer Elisio 17H NR  Dialysate 3K;2.5 Ca  Dialysis Anticoagulant None  Dialysate Flow Ordered 600  Blood Flow Rate Ordered 400 mL/min  Ultrafiltration Goal 2 Liters  Pre  Treatment Labs Hepatitis B Surface Antigen  Education / Care Plan  Dialysis Education Provided Yes  Hemodialysis Catheter Right Internal jugular Double lumen Temporary (Non-Tunneled)  No Placement Date or Time found.   Placed prior to admission: Yes  Orientation: Right  Access Location: Internal jugular  Hemodialysis Catheter Type: Double lumen Temporary (Non-Tunneled)  Site Condition No complications  Blue Lumen Status Flushed;Blood return noted  Red Lumen Status Flushed;Blood return noted  Purple Lumen Status N/A  Dressing Type Gauze/Drain sponge  Dressing Status Clean;Dry  Drainage Description None  Dressing Change Due 10/03/19

## 2019-10-03 NOTE — ED Notes (Signed)
Pt states he is having trouble breathing- pt noted to be sitting up and taking deep breaths- pt O2 sats stable at 100%

## 2019-10-03 NOTE — ED Notes (Signed)
Attempted to call report and was left on hold for 7 minutes with no answer- will try again in 10 minutes

## 2019-10-03 NOTE — H&P (Signed)
History and Physical    Brett Small [id number] DOB: Dec 25, 1975 DOA: 10/03/2019  I have briefly reviewed the patient's prior medical records in [place]  PCP: Patient, [name]  Patient coming from: home  Chief Complaint: shortness of breath   HPI: Brett Small is a 43 y.o. male with medical history significant of end-stage renal disease on dialysis, hypertension, PAD, who presents to the hospital with chief complaint of shortness of breath.  Patient tells me that he has been more short of breath and had a cough for the past week with sputum production.  Shortness of breath has gotten so bad that he did decided to come to the emergency room today.  He also reports intermittent nausea and poor p.o. intake over the last couple weeks.  This appears to have chronic component, he smokes marijuana and has noticed that sometimes after smoking marijuana he is more nauseous.  He reports compliance with his dialysis, however on occasions he gets nauseous from that as well.  He denies any fever or chills, denies any chest pain or palpitations.  He denies any abdominal pain or diarrhea.  ED Course: In the ER he is afebrile 97.8, blood pressure is stable and he is satting 90% on room air.  Blood work reveals a creatinine of 4.45, and CBC shows slightly increased white count to 13.9.  Covid PCR is negative. Chest x-ray concerning for left lung base opacity.  Nephrology was consulted by EDP, recommended dialysis inpatient and admission for overnight observation, and also initiation of antibiotics.  We are asked to admit.  Review of Systems: All systems reviewed, and apart from HPI, all negative  Past Medical History:  Diagnosis Date   ESRD (end stage renal disease) ([place])    Hypertension    PAD (peripheral artery disease) ([place])    Required aortofemoral stent-which had closed and had to redo the procedure and ischemia of limb.   Peripheral vascular disease ([place])    Renal disorder     Secondary hyperparathyroidism of renal origin [facility])     Past Surgical History:  Procedure Laterality Date   A/V SHUNT INTERVENTION Left 01/19/2019   Procedure: LEFT UPPER EXTREMITY A/V SHUNTOGRAM / UPPER EXTREMITY ANGIOGRAM;  Surgeon: Algernon Huxley, MD;  Location: [place] [facility];  Service: Cardiovascular;  Laterality: Left;   A/V SHUNT INTERVENTION Left 03/02/2019   Procedure: A/V SHUNT INTERVENTION;  Surgeon: Algernon Huxley, MD;  Location: [place] [facility];  Service: Cardiovascular;  Laterality: Left;   AORTA - FEMORAL ARTERY BYPASS GRAFT     AV FISTULA PLACEMENT Left 12/26/2018   Procedure: INSERTION OF GORE STRETCH VASCULAR 4-7MM X  45CM IN LEFT UPPER ARM;  Surgeon: Marty Heck, MD;  Location: [place];  Service: Vascular;  Laterality: Left;   AV FISTULA PLACEMENT Right 08/12/2019   Procedure: INSERTION OF ARTERIOVENOUS (AV) GORE-TEX GRAFT ARM;  Surgeon: Algernon Huxley, MD;  Location: [facility];  Service: Vascular;  Laterality: Right;   DIALYSIS/PERMA CATHETER REMOVAL N/A 02/06/2019   Procedure: DIALYSIS/PERMA CATHETER REMOVAL;  Surgeon: Algernon Huxley, MD;  Location: [place] [facility];  Service: Cardiovascular;  Laterality: N/A;   LEFT HEART CATH AND CORONARY ANGIOGRAPHY Right 07/10/2019   Procedure: LEFT HEART CATH AND CORONARY ANGIOGRAPHY;  Surgeon: Dionisio David, MD;  Location: [place] [facility];  Service: Cardiovascular;  Laterality: Right;   PERIPHERAL VASCULAR THROMBECTOMY Left 07/28/2019   Procedure: Left Upper Extremity Dialysis Access [place];  Surgeon: [name]  G, MD;  Location: [place] [facility];  Service: Cardiovascular;  Laterality: Left;   REMOVAL OF A DIALYSIS CATHETER Right 08/26/2019   Procedure: REMOVAL OF A DIALYSIS CATHETER;  Surgeon: Algernon Huxley, MD;  Location: [facility];  Service: Vascular;  Laterality: Right;   TEMPORARY DIALYSIS CATHETER N/A 08/25/2019   Procedure: TEMPORARY DIALYSIS CATHETER;  Surgeon: Katha Cabal, MD;  Location:  [place] [facility];  Service: Cardiovascular;  Laterality: N/A;   UPPER EXTREMITY ANGIOGRAPHY Left 08/13/2019   Procedure: UPPER EXTREMITY ANGIOGRAPHy;  Surgeon: Algernon Huxley, MD;  Location: [place] [facility];  Service: Cardiovascular;  Laterality: Left;     reports that he has been smoking cigarettes. He has a 15.00 pack-year smoking history. He has never used smokeless tobacco. He reports previous alcohol use. He reports current drug use. Frequency: 1.00 time per week. Drug: Marijuana.  Allergies  Allergen Reactions   Codeine Nausea Only    Patient questioned this (??)   Sulfa Antibiotics Hives and Nausea And Vomiting    Family History  Problem Relation Age of Onset   Hypertension Other    Diabetes Other    Clotting disorder Father     Prior to Admission medications   Medication Sig Start Date End Date Taking? Authorizing Provider  amLODipine (NORVASC) 5 MG tablet Take 1 tablet (5 mg total) by mouth daily. 08/28/19   Danford, Suann Larry, MD  apixaban (ELIQUIS) 2.5 MG TABS tablet Take 1 tablet (2.5 mg total) by mouth 2 (two) times daily. 01/26/19   Clapacs, Madie Reno, MD  atorvastatin (LIPITOR) 80 MG tablet Take 1 tablet (80 mg total) by mouth at bedtime. 01/26/19   Clapacs, Madie Reno, MD  b complex-C-folic acid 1 MG capsule Take 1 capsule by mouth daily after supper.     [provider]  carvedilol (COREG) 25 MG tablet Take 1 tablet (25 mg total) by mouth 2 (two) times daily with a meal. 01/26/19   Clapacs, Madie Reno, MD  gabapentin (NEURONTIN) 100 MG capsule Take 1 capsule (100 mg total) by mouth 3 (three) times daily. 01/26/19   Clapacs, Madie Reno, MD  hydrALAZINE (APRESOLINE) 25 MG tablet Take 1 tablet (25 mg total) by mouth every 8 (eight) hours. 01/26/19   Clapacs, Madie Reno, MD  hydrOXYzine (ATARAX/VISTARIL) 25 MG tablet Take 25 mg by mouth 3 (three) times daily as needed for anxiety.    [provider]  levETIRAcetam (KEPPRA) 500 MG tablet Take 1 tablet (500 mg total) by  mouth 2 (two) times daily. 08/17/19   Mayo, Pete Pelt, MD  levofloxacin (LEVAQUIN) 500 MG tablet Take 1 tablet (500 mg total) by mouth every other day. 08/29/19   Danford, Suann Larry, MD  loratadine (CLARITIN) 10 MG tablet Take 1 tablet (10 mg total) by mouth daily. 03/04/19   Salary, Holly Bodily D, MD  multivitamin (RENA-VIT) TABS tablet Take 1 tablet by mouth daily. 01/27/19   Clapacs, Madie Reno, MD  neomycin-bacitracin-polymyxin (NEOSPORIN) OINT Apply 1 application topically 2 (two) times daily. Patient taking differently: Apply 1 application topically as needed.  01/26/19   Clapacs, Madie Reno, MD  Nutritional Supplements (FEEDING SUPPLEMENT, NEPRO CARB STEADY,) LIQD Take 237 mLs by mouth 2 (two) times daily between meals. 07/06/19   Epifanio Lesches, MD  sevelamer carbonate (RENVELA) 800 MG tablet Take 2 tablets (1,600 mg total) by mouth 3 (three) times daily with meals. 01/26/19   Clapacs, Madie Reno, MD    Physical Exam: Vitals:   10/03/19  1145 10/03/19 1149 10/03/19 1352 10/03/19 1353  BP:  (!) 177/116 (!) 133/107   Pulse:  (!) 105  (!) 106  Resp:  20    Temp:  97.8 F (36.6 C)    TempSrc:  Oral    SpO2:  100%  100%  Weight: 44.5 kg     Height: 5\' 1"  (1.549 m)       Constitutional: NAD, calm, comfortable, cachectic appearing Eyes: PERRL, lids and conjunctivae normal ENMT: Mucous membranes are moist.  Neck: normal, supple Respiratory: clear to auscultation bilaterally, no wheezing, no crackles. Normal respiratory effort. Cardiovascular: Regular rate and rhythm, no murmurs / rubs / gallops.  Trace extremity edema. Abdomen: no tenderness, no masses palpated. Bowel sounds positive.  Musculoskeletal: no clubbing / cyanosis. Normal muscle tone.  Skin: no rashes, lesions, ulcers. No induration Neurologic: CN 2-12 grossly intact. Strength 5/5 in all 4.  Psychiatric: Normal judgment and insight. Alert and oriented x 3. Normal mood.   Labs on Admission: I have personally reviewed following labs and  imaging studies  CBC: Recent Labs  Lab 10/03/19 1333  WBC 13.9*  HGB 10.7*  HCT 36.2*  MCV 100.0  PLT 123456*   Basic Metabolic Panel: Recent Labs  Lab 10/03/19 1333  NA 132*  K 4.2  CL 90*  CO2 14*  GLUCOSE 104*  BUN 42*  CREATININE 4.45*  CALCIUM 9.1   Liver Function Tests: No results for input(s): AST, ALT, ALKPHOS, BILITOT, PROT, ALBUMIN in the last 168 hours. Coagulation Profile: No results for input(s): INR, PROTIME in the last 168 hours. BNP (last 3 results) No results for input(s): PROBNP in the last 8760 hours. CBG: No results for input(s): GLUCAP in the last 168 hours. Thyroid Function Tests: No results for input(s): TSH, T4TOTAL, FREET4, T3FREE, THYROIDAB in the last 72 hours. Urine analysis:    Component Value Date/Time   COLORURINE AMBER (A) 08/05/2019 2049   APPEARANCEUR CLOUDY (A) 08/05/2019 2049   LABSPEC 1.023 08/05/2019 2049   PHURINE 6.0 08/05/2019 2049   GLUCOSEU NEGATIVE 08/05/2019 2049   HGBUR MODERATE (A) 08/05/2019 2049   [place] NEGATIVE 08/05/2019 2049   [place] 08/05/2019 2049   PROTEINUR 100 (A) 08/05/2019 2049   NITRITE NEGATIVE 08/05/2019 2049   LEUKOCYTESUR SMALL (A) 08/05/2019 2049     Radiological Exams on Admission: DG Chest 2 View  Result Date: 10/03/2019 CLINICAL DATA:  Chest pressure since yesterday. States has had cough for one week. Was tested for COVID yesterday but does not have result. DIalysis patient with permacath R upper chest. Hx - ESRD, HTN, AAA, current smoker 0.5 ppd. EXAM: CHEST - 2 VIEW COMPARISON:  08/25/2019.  CT, 07/20/2019. FINDINGS: Cardiac silhouette is mildly enlarged. No mediastinal or hilar masses. Right internal jugular tunneled dual lumen central venous catheter is stable, distal tip in the right atrium. Moderate left pleural effusion associated with dependent left lung base opacity, the latter most likely atelectasis although pneumonia is possible. Lungs show prominent bronchovascular  markings bilaterally, but are otherwise clear. No pulmonary edema. No pneumothorax. Skeletal structures are grossly intact. Stable left axillary to upper extremity vascular stent. IMPRESSION: 1. Moderate right pleural effusion, which appears decreased compared to the prior chest radiograph. 2. Left lung base opacity, which is most likely atelectasis. Pneumonia is not excluded but felt less likely. 3. No evidence of pulmonary edema.  Mild cardiomegaly. Electronically Signed   By: Lajean Manes M.D.   On: 10/03/2019 12:58    EKG: Independently reviewed.  Sinus rhythm, tachycardia  Assessment/Plan  Principal Problem Dyspnea likely in the setting of bronchitis  -Chest x-ray with slight infiltrate, he has been having productive cough, start on Levaquin -Respiratory status appears to be stable, he is comfortable on room air  Active Problems End-stage renal disease -Nephrology consulted, appreciate input  Chronic systolic CHF -Most recent 2D echo in October 2020 showed an EF less than 30%.  He has some lower extremity swelling but overall appears euvolemic.  Volume status managed by dialysis.  History of PAD -Status post left femoral bypass graft in January 2020, also history of SMA occlusion, he is on Eliquis, continue  History of fatty liver disease -Stable now  Hypertension -Resume home medications  Hyperlipidemia -Resume statin   DVT prophylaxis: On Eliquis Code Status: Full code Family Communication: No family at bedside Disposition Plan: Home when ready Bed Type: MedSurg Consults called: Nephrology Obs/Inp: Observation  Marzetta Board, MD, PhD [facility]  Contact via [email or web address]  10/03/2019, 4:30 PM

## 2019-10-03 NOTE — ED Notes (Signed)
Pt up to use bathroom 

## 2019-10-03 NOTE — Progress Notes (Signed)
HD Tx Started    10/03/19 1700  Vital Signs  Pulse Rate (!) 105  Pulse Rate Source Monitor  Resp 19  BP (!) 153/106  BP Location Left Leg  BP Method Automatic  Patient Position (if appropriate) Lying  Oxygen Therapy  SpO2 100 %  O2 Device Nasal Cannula  O2 Flow Rate (L/min) 2 L/min  Pain Assessment  Pain Scale 0-10  Pain Score 0  During Hemodialysis Assessment  Blood Flow Rate (mL/min) 400 mL/min  Arterial Pressure (mmHg) -200 mmHg  Venous Pressure (mmHg) 100 mmHg  Transmembrane Pressure (mmHg) 80 mmHg  Ultrafiltration Rate (mL/min) 840 mL/min  Dialysate Flow Rate (mL/min) 600 ml/min  Conductivity: Machine  14.1  HD Safety Checks Performed Yes  Dialysis Fluid Bolus Normal Saline  Bolus Amount (mL) 250 mL  Intra-Hemodialysis Comments Tx initiated

## 2019-10-03 NOTE — ED Notes (Signed)
Called lab to draw blood work.

## 2019-10-03 NOTE — Progress Notes (Signed)
Post HD Tx Assessment   10/03/19 2015  Neurological  Level of Consciousness Alert  Orientation Level Oriented X4  Respiratory  Respiratory Pattern Regular;Unlabored  Chest Assessment Chest expansion symmetrical  Bilateral Breath Sounds Fine crackles  Cardiac  Pulse Irregular  Heart Sounds S1, S2  Jugular Venous Distention (JVD) No  ECG Monitor Yes  Cardiac Rhythm ST  Antiarrhythmic device No  Vascular  R Radial Pulse +2  L Radial Pulse +2  Integumentary  Integumentary (WDL) X  Skin Color Appropriate for ethnicity  Skin Condition Dry  Musculoskeletal  Musculoskeletal (WDL) X  Generalized Weakness Yes  Gastrointestinal  Bowel Sounds Assessment Active  GU Assessment  Genitourinary (WDL) X  Genitourinary Symptoms Oliguria (HD pt)  Psychosocial  Psychosocial (WDL) WDL

## 2019-10-03 NOTE — ED Notes (Signed)
Report given to Misty, RN

## 2019-10-04 ENCOUNTER — Inpatient Hospital Stay: Payer: Medicare Other

## 2019-10-04 ENCOUNTER — Observation Stay: Payer: Medicare Other

## 2019-10-04 DIAGNOSIS — I82621 Acute embolism and thrombosis of deep veins of right upper extremity: Secondary | ICD-10-CM | POA: Diagnosis not present

## 2019-10-04 DIAGNOSIS — Z832 Family history of diseases of the blood and blood-forming organs and certain disorders involving the immune mechanism: Secondary | ICD-10-CM | POA: Diagnosis not present

## 2019-10-04 DIAGNOSIS — R1110 Vomiting, unspecified: Secondary | ICD-10-CM | POA: Diagnosis present

## 2019-10-04 DIAGNOSIS — K219 Gastro-esophageal reflux disease without esophagitis: Secondary | ICD-10-CM | POA: Diagnosis not present

## 2019-10-04 DIAGNOSIS — I959 Hypotension, unspecified: Secondary | ICD-10-CM | POA: Diagnosis not present

## 2019-10-04 DIAGNOSIS — R112 Nausea with vomiting, unspecified: Secondary | ICD-10-CM | POA: Diagnosis present

## 2019-10-04 DIAGNOSIS — K2101 Gastro-esophageal reflux disease with esophagitis, with bleeding: Secondary | ICD-10-CM | POA: Diagnosis present

## 2019-10-04 DIAGNOSIS — N2581 Secondary hyperparathyroidism of renal origin: Secondary | ICD-10-CM | POA: Diagnosis present

## 2019-10-04 DIAGNOSIS — I82629 Acute embolism and thrombosis of deep veins of unspecified upper extremity: Secondary | ICD-10-CM | POA: Diagnosis present

## 2019-10-04 DIAGNOSIS — Z885 Allergy status to narcotic agent status: Secondary | ICD-10-CM | POA: Diagnosis not present

## 2019-10-04 DIAGNOSIS — Z882 Allergy status to sulfonamides status: Secondary | ICD-10-CM | POA: Diagnosis not present

## 2019-10-04 DIAGNOSIS — D689 Coagulation defect, unspecified: Secondary | ICD-10-CM | POA: Diagnosis present

## 2019-10-04 DIAGNOSIS — K3189 Other diseases of stomach and duodenum: Secondary | ICD-10-CM | POA: Diagnosis not present

## 2019-10-04 DIAGNOSIS — E440 Moderate protein-calorie malnutrition: Secondary | ICD-10-CM | POA: Diagnosis present

## 2019-10-04 DIAGNOSIS — J40 Bronchitis, not specified as acute or chronic: Secondary | ICD-10-CM | POA: Diagnosis not present

## 2019-10-04 DIAGNOSIS — N186 End stage renal disease: Secondary | ICD-10-CM

## 2019-10-04 DIAGNOSIS — I132 Hypertensive heart and chronic kidney disease with heart failure and with stage 5 chronic kidney disease, or end stage renal disease: Secondary | ICD-10-CM | POA: Diagnosis present

## 2019-10-04 DIAGNOSIS — Z833 Family history of diabetes mellitus: Secondary | ICD-10-CM | POA: Diagnosis not present

## 2019-10-04 DIAGNOSIS — R64 Cachexia: Secondary | ICD-10-CM | POA: Diagnosis present

## 2019-10-04 DIAGNOSIS — J209 Acute bronchitis, unspecified: Secondary | ICD-10-CM | POA: Diagnosis present

## 2019-10-04 DIAGNOSIS — K920 Hematemesis: Secondary | ICD-10-CM | POA: Diagnosis not present

## 2019-10-04 DIAGNOSIS — Z992 Dependence on renal dialysis: Secondary | ICD-10-CM | POA: Diagnosis not present

## 2019-10-04 DIAGNOSIS — R569 Unspecified convulsions: Secondary | ICD-10-CM

## 2019-10-04 DIAGNOSIS — Z7901 Long term (current) use of anticoagulants: Secondary | ICD-10-CM | POA: Diagnosis not present

## 2019-10-04 DIAGNOSIS — K2100 Gastro-esophageal reflux disease with esophagitis, without bleeding: Secondary | ICD-10-CM | POA: Diagnosis not present

## 2019-10-04 DIAGNOSIS — J90 Pleural effusion, not elsewhere classified: Secondary | ICD-10-CM

## 2019-10-04 DIAGNOSIS — K2211 Ulcer of esophagus with bleeding: Secondary | ICD-10-CM | POA: Diagnosis present

## 2019-10-04 DIAGNOSIS — G40909 Epilepsy, unspecified, not intractable, without status epilepticus: Secondary | ICD-10-CM | POA: Diagnosis present

## 2019-10-04 DIAGNOSIS — I9589 Other hypotension: Secondary | ICD-10-CM | POA: Diagnosis not present

## 2019-10-04 DIAGNOSIS — I5022 Chronic systolic (congestive) heart failure: Secondary | ICD-10-CM

## 2019-10-04 DIAGNOSIS — Z79899 Other long term (current) drug therapy: Secondary | ICD-10-CM | POA: Diagnosis not present

## 2019-10-04 DIAGNOSIS — Z8249 Family history of ischemic heart disease and other diseases of the circulatory system: Secondary | ICD-10-CM | POA: Diagnosis not present

## 2019-10-04 DIAGNOSIS — Z20828 Contact with and (suspected) exposure to other viral communicable diseases: Secondary | ICD-10-CM | POA: Diagnosis present

## 2019-10-04 DIAGNOSIS — D696 Thrombocytopenia, unspecified: Secondary | ICD-10-CM

## 2019-10-04 DIAGNOSIS — Z681 Body mass index (BMI) 19 or less, adult: Secondary | ICD-10-CM | POA: Diagnosis not present

## 2019-10-04 DIAGNOSIS — K2210 Ulcer of esophagus without bleeding: Secondary | ICD-10-CM | POA: Diagnosis not present

## 2019-10-04 LAB — COMPREHENSIVE METABOLIC PANEL
ALT: 37 U/L (ref 0–44)
AST: 28 U/L (ref 15–41)
Albumin: 2.6 g/dL — ABNORMAL LOW (ref 3.5–5.0)
Alkaline Phosphatase: 98 U/L (ref 38–126)
Anion gap: 13 (ref 5–15)
BUN: 30 mg/dL — ABNORMAL HIGH (ref 6–20)
CO2: 25 mmol/L (ref 22–32)
Calcium: 8.5 mg/dL — ABNORMAL LOW (ref 8.9–10.3)
Chloride: 95 mmol/L — ABNORMAL LOW (ref 98–111)
Creatinine, Ser: 3.17 mg/dL — ABNORMAL HIGH (ref 0.61–1.24)
GFR calc Af Amer: 26 mL/min — ABNORMAL LOW (ref >60)
GFR calc non Af Amer: 22 mL/min — ABNORMAL LOW (ref >60)
Glucose, Bld: 111 mg/dL — ABNORMAL HIGH (ref 70–99)
Potassium: 3.5 mmol/L (ref 3.5–5.1)
Sodium: 133 mmol/L — ABNORMAL LOW (ref 135–145)
Total Bilirubin: 3.9 mg/dL — ABNORMAL HIGH (ref 0.3–1.2)
Total Protein: 5.7 g/dL — ABNORMAL LOW (ref 6.5–8.1)

## 2019-10-04 LAB — GLUCOSE, CAPILLARY
Glucose-Capillary: 113 mg/dL — ABNORMAL HIGH (ref 70–99)
Glucose-Capillary: 165 mg/dL — ABNORMAL HIGH (ref 70–99)

## 2019-10-04 LAB — HEPATITIS B SURFACE ANTIGEN: Hepatitis B Surface Ag: NONREACTIVE

## 2019-10-04 LAB — LACTIC ACID, PLASMA: Lactic Acid, Venous: 2.1 mmol/L (ref 0.5–1.9)

## 2019-10-04 LAB — CBC
HCT: 29.7 % — ABNORMAL LOW (ref 39.0–52.0)
HCT: 25.4 % — ABNORMAL LOW (ref 39.0–52.0)
HCT: 24.5 % — ABNORMAL LOW (ref 39.0–52.0)
Hemoglobin: 9.4 g/dL — ABNORMAL LOW (ref 13.0–17.0)
Hemoglobin: 8.3 g/dL — ABNORMAL LOW (ref 13.0–17.0)
Hemoglobin: 8.2 g/dL — ABNORMAL LOW (ref 13.0–17.0)
MCH: 29.5 pg (ref 26.0–34.0)
MCH: 29.5 pg (ref 26.0–34.0)
MCH: 29.5 pg (ref 26.0–34.0)
MCHC: 31.6 g/dL (ref 30.0–36.0)
MCHC: 32.7 g/dL (ref 30.0–36.0)
MCHC: 33.5 g/dL (ref 30.0–36.0)
MCV: 93.1 fL (ref 80.0–100.0)
MCV: 90.4 fL (ref 80.0–100.0)
MCV: 88.1 fL (ref 80.0–100.0)
Platelets: 71 10*3/uL — ABNORMAL LOW (ref 150–400)
Platelets: 79 10*3/uL — ABNORMAL LOW (ref 150–400)
Platelets: 78 10*3/uL — ABNORMAL LOW (ref 150–400)
RBC: 3.19 MIL/uL — ABNORMAL LOW (ref 4.22–5.81)
RBC: 2.81 MIL/uL — ABNORMAL LOW (ref 4.22–5.81)
RBC: 2.78 MIL/uL — ABNORMAL LOW (ref 4.22–5.81)
RDW: 18.6 % — ABNORMAL HIGH (ref 11.5–15.5)
RDW: 18.9 % — ABNORMAL HIGH (ref 11.5–15.5)
RDW: 18.7 % — ABNORMAL HIGH (ref 11.5–15.5)
WBC: 10.1 10*3/uL (ref 4.0–10.5)
WBC: 10.3 10*3/uL (ref 4.0–10.5)
WBC: 9.2 10*3/uL (ref 4.0–10.5)
nRBC: 0.7 % — ABNORMAL HIGH (ref 0.0–0.2)
nRBC: 0.3 % — ABNORMAL HIGH (ref 0.0–0.2)
nRBC: 0 % (ref 0.0–0.2)

## 2019-10-04 LAB — HEPATITIS B CORE ANTIBODY, IGM: Hep B C IgM: NONREACTIVE

## 2019-10-04 MED ORDER — PHENOL 1.4 % MT LIQD
1.0000 | OROMUCOSAL | Status: DC | PRN
  Filled 2019-10-04: qty 177

## 2019-10-04 MED ORDER — LEVETIRACETAM IN NACL 500 MG/100ML IV SOLN
500.0000 mg | Freq: Two times a day (BID) | INTRAVENOUS | Status: DC
  Administered 2019-10-04 – 2019-10-05 (×3): 500 mg via INTRAVENOUS
  Filled 2019-10-04 (×4): qty 100

## 2019-10-04 MED ORDER — SODIUM CHLORIDE 0.9 % IV BOLUS
500.0000 mL | Freq: Once | INTRAVENOUS | Status: AC
  Administered 2019-10-04: 500 mL via INTRAVENOUS

## 2019-10-04 MED ORDER — GUAIFENESIN-DM 100-10 MG/5ML PO SYRP
5.0000 mL | ORAL_SOLUTION | ORAL | Status: DC | PRN
  Administered 2019-10-04 – 2019-10-08 (×7): 5 mL via ORAL
  Filled 2019-10-04 (×7): qty 5

## 2019-10-04 MED ORDER — PANTOPRAZOLE SODIUM 40 MG IV SOLR
40.0000 mg | Freq: Two times a day (BID) | INTRAVENOUS | Status: DC
  Administered 2019-10-05 – 2019-10-07 (×6): 40 mg via INTRAVENOUS
  Filled 2019-10-04 (×6): qty 40

## 2019-10-04 MED ORDER — METOCLOPRAMIDE HCL 5 MG/ML IJ SOLN
10.0000 mg | Freq: Once | INTRAMUSCULAR | Status: AC
  Administered 2019-10-04: 10 mg via INTRAVENOUS
  Filled 2019-10-04: qty 2

## 2019-10-04 NOTE — Progress Notes (Signed)
Patient BP 88/61. MD notified, bolus ordered.

## 2019-10-04 NOTE — Progress Notes (Signed)
[place], [place] 10/04/19  Subjective:   Hospital day # 0 Patient presents today with the emergency room for nausea, unable to keep anything down since yesterday. Also complains of cough and congestion along with some shortness of breath. Denies any fever. Work-up so far in the emergency room shows COVID-19 test is negative Chest x-ray with moderate left pleural effusion, possible left lung base opacity 2 L of fluid was removed with dialysis on Saturday. Patient feels overall better but still has a cough.  Sitting up eating breakfast when seen.   Objective:  Vital signs in last 24 hours:  Temp:  [97.9 F (36.6 C)-98.8 F (37.1 C)] 98.1 F (36.7 C) (12/13 0835) Pulse Rate:  [93-109] 97 (12/13 0835) Resp:  [18-29] 20 (12/13 0835) BP: (119-163)/(88-121) 132/88 (12/13 0835) SpO2:  [98 %-100 %] 100 % (12/13 0835) Weight:  [44.5 kg] 44.5 kg (12/12 1630)  Weight change:  Filed Weights   10/03/19 1145 10/03/19 1630  Weight: 44.5 kg 44.5 kg    Intake/Output:    Intake/Output Summary (Last 24 hours) at 10/04/2019 1251 Last data filed at 10/04/2019 1041 Gross per 24 hour  Intake 240 ml  Output 2000 ml  Net -1760 ml     Physical Exam: General:  Chronically ill-appearing, laying in the bed  HEENT  anicteric, moist oral mucous membranes  Pulm/lungs  mild crackles at bases  CVS/Heart  no rub or gallop  Abdomen:   Soft, nontender  Extremities:  1+ edema left leg  Neurologic:  Alert, able to answer questions  Skin:  No acute rashes  Access:  Right IJ PermCath       Basic Metabolic Panel:  Recent Labs  Lab 10/03/19 1333 10/04/19 0615  NA 132* 133*  K 4.2 3.5  CL 90* 95*  CO2 14* 25  GLUCOSE 104* 111*  BUN 42* 30*  CREATININE 4.45* 3.17*  CALCIUM 9.1 8.5*     CBC: Recent Labs  Lab 10/03/19 1333 10/04/19 0615  WBC 13.9* 10.1  HGB 10.7* 9.4*  HCT 36.2* 29.7*  MCV 100.0 93.1  PLT 118* 71*      Lab Results  Component  Value Date   HEPBSAG NON REACTIVE 10/03/2019   HEPBSAB Non Reactive 12/25/2018   HEPBIGM NON REACTIVE 10/03/2019      Microbiology:  Recent Results (from the past 240 hour(s))  Respiratory Panel by RT PCR (Flu A&B, Covid) - Nasopharyngeal Swab     Status: None   Collection Time: 10/03/19  1:38 PM   Specimen: Nasopharyngeal Swab  Result Value Ref Range Status   SARS Coronavirus 2 by RT PCR NEGATIVE NEGATIVE Final    Comment: (NOTE) SARS-CoV-2 target nucleic acids are NOT DETECTED. The SARS-CoV-2 RNA is generally detectable in upper respiratoy specimens during the acute phase of infection. The lowest concentration of SARS-CoV-2 viral copies this assay can detect is 131 copies/mL. A negative result does not preclude SARS-Cov-2 infection and should not be used as the sole basis for treatment or other patient management decisions. A negative result may occur with  improper specimen collection/handling, submission of specimen other than nasopharyngeal swab, presence of viral mutation(s) within the areas targeted by this assay, and inadequate number of viral copies (<131 copies/mL). A negative result must be combined with clinical observations, patient history, and epidemiological information. The expected result is Negative. Fact Sheet for Patients:  PinkCheek.be Fact Sheet for Healthcare Providers:  GravelBags.it This test is not yet ap proved or cleared by  the [employer] and  has been authorized for detection and/or diagnosis of SARS-CoV-2 by FDA under an Emergency Use Authorization (EUA). This EUA will remain  in effect (meaning this test can be used) for the duration of the COVID-19 declaration under Section 564(b)(1) of the Act, 21 U.S.C. section 360bbb-3(b)(1), unless the authorization is terminated or revoked sooner.    Influenza A by PCR NEGATIVE NEGATIVE Final   Influenza B by PCR NEGATIVE NEGATIVE Final     Comment: (NOTE) The Xpert Xpress SARS-CoV-2/FLU/RSV assay is intended as an aid in  the diagnosis of influenza from Nasopharyngeal swab specimens and  should not be used as a sole basis for treatment. Nasal washings and  aspirates are unacceptable for Xpert Xpress SARS-CoV-2/FLU/RSV  testing. Fact Sheet for Patients: PinkCheek.be Fact Sheet for Healthcare Providers: GravelBags.it This test is not yet approved or cleared by the [place] FDA and  has been authorized for detection and/or diagnosis of SARS-CoV-2 by  FDA under an Emergency Use Authorization (EUA). This EUA will remain  in effect (meaning this test can be used) for the duration of the  Covid-19 declaration under Section 564(b)(1) of the Act, 21  U.S.C. section 360bbb-3(b)(1), unless the authorization is  terminated or revoked. Performed at [facility], [address] [place], [place] [address]     Coagulation Studies: No results for input(s): LABPROT, INR in the last 72 hours.  Urinalysis: No results for input(s): COLORURINE, LABSPEC, PHURINE, GLUCOSEU, HGBUR, BILIRUBINUR, KETONESUR, PROTEINUR, UROBILINOGEN, NITRITE, LEUKOCYTESUR in the last 72 hours.  Invalid input(s): APPERANCEUR    Imaging: DG Chest 2 View  Result Date: 10/03/2019 CLINICAL DATA:  Chest pressure since yesterday. States has had cough for one week. Was tested for COVID yesterday but does not have result. DIalysis patient with permacath R upper chest. Hx - ESRD, HTN, AAA, current smoker 0.5 ppd. EXAM: CHEST - 2 VIEW COMPARISON:  08/25/2019.  CT, 07/20/2019. FINDINGS: Cardiac silhouette is mildly enlarged. No mediastinal or hilar masses. Right internal jugular tunneled dual lumen central venous catheter is stable, distal tip in the right atrium. Moderate left pleural effusion associated with dependent left lung base opacity, the latter most likely atelectasis although pneumonia is  possible. Lungs show prominent bronchovascular markings bilaterally, but are otherwise clear. No pulmonary edema. No pneumothorax. Skeletal structures are grossly intact. Stable left axillary to upper extremity vascular stent. IMPRESSION: 1. Moderate right pleural effusion, which appears decreased compared to the prior chest radiograph. 2. Left lung base opacity, which is most likely atelectasis. Pneumonia is not excluded but felt less likely. 3. No evidence of pulmonary edema.  Mild cardiomegaly. Electronically Signed   By: Lajean Manes M.D.   On: 10/03/2019 12:58     Medications:    . apixaban  2.5 mg Oral BID  . atorvastatin  80 mg Oral QHS  . carvedilol  25 mg Oral BID WC  . Chlorhexidine Gluconate Cloth  6 each Topical Q0600  . feeding supplement (NEPRO CARB STEADY)  237 mL Oral BID BM  . gabapentin  100 mg Oral TID  . hydrALAZINE  25 mg Oral Q8H  . levETIRAcetam  500 mg Oral BID  . [START ON 10/05/2019] levofloxacin  250 mg Oral Q48H  . levofloxacin  500 mg Oral Once  . loratadine  10 mg Oral Daily  . multivitamin  1 tablet Oral QHS  . sevelamer carbonate  1,600 mg Oral TID WC  . sodium chloride flush  3 mL Intravenous Once  acetaminophen **OR** acetaminophen, guaiFENesin-dextromethorphan, hydrOXYzine, neomycin-bacitracin-polymyxin, ondansetron **OR** ondansetron (ZOFRAN) IV  Assessment/ Plan:  43 y.o. male with end-stage renal disease on hemodialysis, hypertension, peripheral vascular disease, depression, history of PEA event October 2020, current smoker  [facility] [address]/TTS/9 IJ catheter  1.  Cough/congestion, shortness of breath -Patient is a current smoker -Agree with Levaquin -Can consider steroids for bronchitis  2. End-stage renal disease -Tolerated dialysis well on Saturday.  2 L removed. -Next hemodialysis planned for Tuesday -Electrolytes and volume status are acceptable.  No acute indication for dialysis today.   3. Anemia of chronic kidney  disease Lab Results  Component Value Date   HGB 9.4 (L) 10/04/2019  -Low-dose Epogen with dialysis     LOS: 0 Aubrianne Molyneux 12/13/202012:51 PM  [place], [place]  Note: This note was prepared with Dragon dictation. Any transcription errors are unintentional

## 2019-10-04 NOTE — Progress Notes (Signed)
NG placed at 1630. Xray verification done. Inserted NG tube to 58 cm. Attached to low intermittent suction.

## 2019-10-04 NOTE — Progress Notes (Signed)
Provider was informed of elevated lactic acid

## 2019-10-04 NOTE — Progress Notes (Addendum)
PROGRESS NOTE    Brett Small  [id number] DOB: 13-Feb-1976 DOA: 10/03/2019 PCP: Patient, [name]    Brief Narrative:  43 year old male with a history of end-stage renal disease on hemodialysis, chronic systolic congestive heart failure with ejection fraction of less than 20%, admitted to the hospital with shortness of breath.  Initially felt to have possible bronchitis he was started on levofloxacin.  He also underwent dialysis which improved her shortness of breath.  Subsequently the following day, he developed nausea and vomiting after eating and subsequently became hypotensive.  Blood pressure was in the 80s.  Staff reported possible coffee-ground emesis.  Abdomen was noted to be somewhat distended.  NG tube was placed and he was made NPO.  We will continue to cycle CBC to follow hemoglobin.  He has been started on Protonix.   Assessment & Plan:   Active Problems:   End stage renal disease on dialysis (HCC)   Thrombocytopenia (HCC)   Coagulopathy (HCC)   Pleural effusion   Seizure (HCC)   Chronic systolic CHF (congestive heart failure) (HCC)   GERD (gastroesophageal reflux disease)   Secondary hyperparathyroidism (HCC)   Bronchitis   Vomiting   Arm DVT (deep venous thromboembolism), acute, right (HCC)   Hypotension   1. Hypotension.  Unclear etiology.  Patient did have vomiting with questions of coffee-ground.  Question GI bleeding.  Patient was also prescribed Coreg 25 mg twice daily as well as hydralazine.  He reports that he was not taking these medications at home for quite some time.  Since his home medications were continued on admission, he received the doses in the morning.  Question medication effect leading to hypotension.  He received a small bolus of IV fluids and blood pressures have improved into the 90s.  Continue to monitor closely. 2. Nausea and vomiting, possible GI bleeding.  Staff reported noticing coffee-ground emesis.  Hemoglobin has had a mild decline  from this morning to 8.3 this afternoon.  We will continue to monitor serial CBC.  Transfuse as necessary.  He has been started on Protonix twice daily.  He has not had any further episodes of emesis.  Case reviewed with GI, Dr Bonna Gains.  She agreed with Protonix at this time and if patient has any further bleeding, recommended starting octreotide drip.  Since his abdomen does appear to be distended, NG tube was placed to suction. 3. Chronic systolic congestive heart failure.  Latest echocardiogram shows ejection fraction of less than 20%.  Will need to monitor volume status closely.  He is chronically on beta-blockers, but this is been held due to hypotension. 4. End-stage renal disease on hemodialysis.  Last dialysis session was 12/12.  Nephrology following 5. Seizure disorder.  Chronically on Keppra.  Will change to IV for now while he is n.p.o. 6. Acute bronchitis.  Initial concerns for bronchitis on admission.  He was started on levofloxacin.  No signs of wheezing at this time, will hold off on steroids. 7. Pleural effusion.  Patient noted to have left-sided pleural effusion which is a chronic finding.  Suspect is related to CHF.  He does not appear to be particularly short of breath at this time.  Continue to monitor. 8. Recent right upper extremity DVT.  Started on Eliquis during recent admission.  Hold Eliquis for now in light of possible GI bleeding. 9. Thrombocytopenia.  Appears to be present for the past several weeks/months.  Platelet count, although low appears to be stable at this time.  Continue  to monitor.   DVT prophylaxis: SCDs Code Status: Full code Family Communication: None present Disposition Plan: Discharge home once hypotension has improved   Consultants:   Neurology  Procedures:     Antimicrobials:   Levofloxacin 12/12.   Subjective: Overall breathing had improved.  He was tolerating solid diet earlier today, but then began having stomach upset and vomiting.   Subsequently, he developed hypotension.  Staff reported vomitus as dark/coffee-ground material.  Objective: Vitals:   10/04/19 0526 10/04/19 0835 10/04/19 1526 10/04/19 1825  BP: (!) 130/94 132/88 (!) 88/61 96/72  Pulse: 93 97 76 73  Resp: 20 20 20    Temp: 97.9 F (36.6 C) 98.1 F (36.7 C) 98.3 F (36.8 C)   TempSrc: Oral Oral Oral   SpO2: 99% 100% 95%   Weight:      Height:        Intake/Output Summary (Last 24 hours) at 10/04/2019 1851 Last data filed at 10/04/2019 1041 Gross per 24 hour  Intake 240 ml  Output 2000 ml  Net -1760 ml   Filed Weights   10/03/19 1145 10/03/19 1630  Weight: 44.5 kg 44.5 kg    Examination:  General exam: Appears calm and comfortable  Respiratory system: diminished breath sounds at left base. Respiratory effort normal. Cardiovascular system: S1 & S2 heard, RRR. No JVD, murmurs, rubs, gallops or clicks. No pedal edema. Gastrointestinal system: Abdomen is distended, soft and nontender. No organomegaly or masses felt. Normal bowel sounds heard. Central nervous system:No focal neurological deficits. Extremities: Symmetric 5 x 5 power. Skin: No rashes, lesions or ulcers Psychiatry: somnolent, but wakes up to voice and answers questions appropriately     Data Reviewed: I have personally reviewed following labs and imaging studies  CBC: Recent Labs  Lab 10/03/19 1333 10/04/19 0615 10/04/19 1543  WBC 13.9* 10.1 10.3  HGB 10.7* 9.4* 8.3*  HCT 36.2* 29.7* 25.4*  MCV 100.0 93.1 90.4  PLT 118* 71* 79*   Basic Metabolic Panel: Recent Labs  Lab 10/03/19 1333 10/04/19 0615  NA 132* 133*  K 4.2 3.5  CL 90* 95*  CO2 14* 25  GLUCOSE 104* 111*  BUN 42* 30*  CREATININE 4.45* 3.17*  CALCIUM 9.1 8.5*   GFR: Estimated Creatinine Clearance: 18.5 mL/min (A) (by C-G formula based on SCr of 3.17 mg/dL (H)). Liver Function Tests: Recent Labs  Lab 10/04/19 0615  AST 28  ALT 37  ALKPHOS 98  BILITOT 3.9*  PROT 5.7*  ALBUMIN 2.6*   No  results for input(s): LIPASE, AMYLASE in the last 168 hours. No results for input(s): AMMONIA in the last 168 hours. Coagulation Profile: No results for input(s): INR, PROTIME in the last 168 hours. Cardiac Enzymes: Recent Labs  Lab 10/03/19 1456  CKTOTAL 33*   BNP (last 3 results) No results for input(s): PROBNP in the last 8760 hours. HbA1C: No results for input(s): HGBA1C in the last 72 hours. CBG: Recent Labs  Lab 10/04/19 0837 10/04/19 1146  GLUCAP 113* 165*   Lipid Profile: No results for input(s): CHOL, HDL, LDLCALC, TRIG, CHOLHDL, LDLDIRECT in the last 72 hours. Thyroid Function Tests: No results for input(s): TSH, T4TOTAL, FREET4, T3FREE, THYROIDAB in the last 72 hours. Anemia Panel: No results for input(s): VITAMINB12, FOLATE, FERRITIN, TIBC, IRON, RETICCTPCT in the last 72 hours. Sepsis Labs: Recent Labs  Lab 10/03/19 2108 10/04/19 0052  LATICACIDVEN 4.5* 2.1*    Recent Results (from the past 240 hour(s))  Respiratory Panel by RT PCR (Flu A&B, Covid) -  Nasopharyngeal Swab     Status: None   Collection Time: 10/03/19  1:38 PM   Specimen: Nasopharyngeal Swab  Result Value Ref Range Status   SARS Coronavirus 2 by RT PCR NEGATIVE NEGATIVE Final    Comment: (NOTE) SARS-CoV-2 target nucleic acids are NOT DETECTED. The SARS-CoV-2 RNA is generally detectable in upper respiratoy specimens during the acute phase of infection. The lowest concentration of SARS-CoV-2 viral copies this assay can detect is 131 copies/mL. A negative result does not preclude SARS-Cov-2 infection and should not be used as the sole basis for treatment or other patient management decisions. A negative result may occur with  improper specimen collection/handling, submission of specimen other than nasopharyngeal swab, presence of viral mutation(s) within the areas targeted by this assay, and inadequate number of viral copies (<131 copies/mL). A negative result must be combined with clinical  observations, patient history, and epidemiological information. The expected result is Negative. Fact Sheet for Patients:  PinkCheek.be Fact Sheet for Healthcare Providers:  GravelBags.it This test is not yet ap proved or cleared by the [place] FDA and  has been authorized for detection and/or diagnosis of SARS-CoV-2 by FDA under an Emergency Use Authorization (EUA). This EUA will remain  in effect (meaning this test can be used) for the duration of the COVID-19 declaration under Section 564(b)(1) of the Act, 21 U.S.C. section 360bbb-3(b)(1), unless the authorization is terminated or revoked sooner.    Influenza A by PCR NEGATIVE NEGATIVE Final   Influenza B by PCR NEGATIVE NEGATIVE Final    Comment: (NOTE) The Xpert Xpress SARS-CoV-2/FLU/RSV assay is intended as an aid in  the diagnosis of influenza from Nasopharyngeal swab specimens and  should not be used as a sole basis for treatment. Nasal washings and  aspirates are unacceptable for Xpert Xpress SARS-CoV-2/FLU/RSV  testing. Fact Sheet for Patients: PinkCheek.be Fact Sheet for Healthcare Providers: GravelBags.it This test is not yet approved or cleared by the [place] FDA and  has been authorized for detection and/or diagnosis of SARS-CoV-2 by  FDA under an Emergency Use Authorization (EUA). This EUA will remain  in effect (meaning this test can be used) for the duration of the  Covid-19 declaration under Section 564(b)(1) of the Act, 21  U.S.C. section 360bbb-3(b)(1), unless the authorization is  terminated or revoked. Performed at [facility], [address] [place], [place] [address]          Radiology Studies: DG Chest 2 View  Result Date: 10/03/2019 CLINICAL DATA:  Chest pressure since yesterday. States has had cough for one week. Was tested for COVID yesterday but does not  have result. DIalysis patient with permacath R upper chest. Hx - ESRD, HTN, AAA, current smoker 0.5 ppd. EXAM: CHEST - 2 VIEW COMPARISON:  08/25/2019.  CT, 07/20/2019. FINDINGS: Cardiac silhouette is mildly enlarged. No mediastinal or hilar masses. Right internal jugular tunneled dual lumen central venous catheter is stable, distal tip in the right atrium. Moderate left pleural effusion associated with dependent left lung base opacity, the latter most likely atelectasis although pneumonia is possible. Lungs show prominent bronchovascular markings bilaterally, but are otherwise clear. No pulmonary edema. No pneumothorax. Skeletal structures are grossly intact. Stable left axillary to upper extremity vascular stent. IMPRESSION: 1. Moderate right pleural effusion, which appears decreased compared to the prior chest radiograph. 2. Left lung base opacity, which is most likely atelectasis. Pneumonia is not excluded but felt less likely. 3. No evidence of pulmonary edema.  Mild cardiomegaly. Electronically Signed  By: Lajean Manes M.D.   On: 10/03/2019 12:58   DG Abd 1 View  Result Date: 10/04/2019 CLINICAL DATA:  NG tube placement EXAM: ABDOMEN - 1 VIEW COMPARISON:  10/04/2019 FINDINGS: Interval placement of esophagogastric tube, tip near the gastroesophageal junction and side port above the diaphragm. Recommend advancement to ensure subdiaphragmatic positioning of tip and side port. Nonobstructive pattern of bowel gas in the included abdomen. No obvious free air. Stent projects over the central abdomen. Cardiomegaly. IMPRESSION: Interval placement of esophagogastric tube, tip near the gastroesophageal junction and side port above the diaphragm. Recommend advancement to ensure subdiaphragmatic positioning of tip and side port. Electronically Signed   By: Eddie Candle M.D.   On: 10/04/2019 17:10   DG Abd 2 Views  Result Date: 10/04/2019 CLINICAL DATA:  Vomiting. EXAM: ABDOMEN - 2 VIEW COMPARISON:  08/25/2019  FINDINGS: There is no bowel dilation to suggest obstruction. No air-fluid levels or free air. Vascular stent overlies the upper to mid lumbar spine. A right common iliac stent is present. These are stable from prior study. There are surgical vascular clips in the central abdomen and left lower quadrant. These are also unchanged. No evidence of renal or ureteral stones. There is opacity at the left lung base, incompletely imaged. Skeletal structures unremarkable. IMPRESSION: 1. No evidence of bowel obstruction or free air.  No acute findings. Electronically Signed   By: Lajean Manes M.D.   On: 10/04/2019 16:11        Scheduled Meds: . atorvastatin  80 mg Oral QHS  . Chlorhexidine Gluconate Cloth  6 each Topical Q0600  . feeding supplement (NEPRO CARB STEADY)  237 mL Oral BID BM  . gabapentin  100 mg Oral TID  . levETIRAcetam  500 mg Oral BID  . [START ON 10/05/2019] levofloxacin  250 mg Oral Q48H  . levofloxacin  500 mg Oral Once  . loratadine  10 mg Oral Daily  . multivitamin  1 tablet Oral QHS  . pantoprazole (PROTONIX) IV  40 mg Intravenous Q12H  . sevelamer carbonate  1,600 mg Oral TID WC  . sodium chloride flush  3 mL Intravenous Once   Continuous Infusions:   LOS: 0 days    Critical care procedure note Authorized and performed by: Kathie Dike Total critical care time: Approximately 35 minutes Due to high probability of clinically significant, life-threatening deterioration, the patient required my highest level of preparedness to intervene emergently and I personally spent this critical care time directly and personally managing the patient.  The critical care time included obtaining a history, examining the patient, pulse oximetry, ordering and review of studies, arranging urgent treatment with development of a management plan, evaluation of patient's response to treatment, frequent reassessment, discussions with other providers.  Critical care time was performed to assess and  manage the high probability of imminent, life-threatening deterioration that could result in multiorgan failure.  It was exclusive of separate billable procedures and treating other patients and teaching time.  Please see MDM section and the rest of the of note for further information on patient assessment and treatment     Kathie Dike, MD [facility]   If 7PM-7AM, please contact night-coverage [email or web address]  10/04/2019, 6:51 PM

## 2019-10-04 NOTE — Progress Notes (Signed)
Patient bp 96/72, dark brown, coffee ground emesis. NG ordered.

## 2019-10-05 ENCOUNTER — Encounter: Payer: Self-pay | Admitting: Internal Medicine

## 2019-10-05 DIAGNOSIS — N2581 Secondary hyperparathyroidism of renal origin: Secondary | ICD-10-CM

## 2019-10-05 DIAGNOSIS — K920 Hematemesis: Secondary | ICD-10-CM

## 2019-10-05 LAB — RENAL FUNCTION PANEL
Albumin: 2.3 g/dL — ABNORMAL LOW (ref 3.5–5.0)
Anion gap: 14 (ref 5–15)
BUN: 52 mg/dL — ABNORMAL HIGH (ref 6–20)
CO2: 26 mmol/L (ref 22–32)
Calcium: 8 mg/dL — ABNORMAL LOW (ref 8.9–10.3)
Chloride: 95 mmol/L — ABNORMAL LOW (ref 98–111)
Creatinine, Ser: 4.25 mg/dL — ABNORMAL HIGH (ref 0.61–1.24)
GFR calc Af Amer: 18 mL/min — ABNORMAL LOW (ref >60)
GFR calc non Af Amer: 16 mL/min — ABNORMAL LOW (ref >60)
Glucose, Bld: 93 mg/dL (ref 70–99)
Phosphorus: 4.3 mg/dL (ref 2.5–4.6)
Potassium: 3.5 mmol/L (ref 3.5–5.1)
Sodium: 135 mmol/L (ref 135–145)

## 2019-10-05 LAB — CBC
HCT: 25.1 % — ABNORMAL LOW (ref 39.0–52.0)
Hemoglobin: 8.3 g/dL — ABNORMAL LOW (ref 13.0–17.0)
MCH: 29.2 pg (ref 26.0–34.0)
MCHC: 33.1 g/dL (ref 30.0–36.0)
MCV: 88.4 fL (ref 80.0–100.0)
Platelets: 79 10*3/uL — ABNORMAL LOW (ref 150–400)
RBC: 2.84 MIL/uL — ABNORMAL LOW (ref 4.22–5.81)
RDW: 18.7 % — ABNORMAL HIGH (ref 11.5–15.5)
WBC: 8.8 10*3/uL (ref 4.0–10.5)
nRBC: 0 % (ref 0.0–0.2)

## 2019-10-05 MED ORDER — EPOETIN ALFA 10000 UNIT/ML IJ SOLN: 4000.0000 [IU] | INTRAMUSCULAR | Status: DC

## 2019-10-05 MED ORDER — LEVETIRACETAM 500 MG PO TABS
500.0000 mg | ORAL_TABLET | Freq: Two times a day (BID) | ORAL | Status: DC
  Administered 2019-10-05 – 2019-10-09 (×8): 500 mg via ORAL
  Filled 2019-10-05 (×8): qty 1

## 2019-10-05 MED ORDER — ASCORBIC ACID 500 MG PO TABS
500.0000 mg | ORAL_TABLET | Freq: Two times a day (BID) | ORAL | Status: DC
  Administered 2019-10-06 – 2019-10-09 (×7): 500 mg via ORAL
  Filled 2019-10-05 (×7): qty 1

## 2019-10-05 NOTE — Consult Note (Signed)
Brett Darby, MD [address]  [place]  [place], [place] [address]  Main: [phone]  Fax: [phone] Pager: [phone]   Consultation  Referring Provider:     No ref. provider found Primary Care Physician:  Patient, [name] Primary Gastroenterologist: Dr. Bonna Gains       Reason for Consultation:   Coffee-ground emesis  Date of Admission:  10/03/2019 Date of Consultation:  10/05/2019         HPI:   Brett Small is a 43 y.o. male with history of end-stage renal disease on hemodialysis who is admitted with acute bronchitis.  He presented with productive cough and shortness of breath, found to have mild leukocytosis.  He also complained of nausea.  He is currently receiving antibiotics for bronchitis. Yesterday, after dialysis.  Patient had breakfast.  Subsequently developed nausea and vomiting followed by drop in blood pressure to 80s.  He also had an episode of coffee-ground emesis.  He had abdominal distention noted by the hospitalist and therefore NG tube was placed and made NPO.  He was also started on Protonix.  He had x-ray KUB which did not reveal evidence of bowel obstruction.  GI is consulted to evaluate for coffee-ground emesis.  Patient is on Eliquis and his last dose was yesterday.  He did not have further episodes of coffee-ground emesis since yesterday.  This morning, patient requested to remove the NG tube as he was feeling better.  NG tube output was about 350 mL and he wanted to eat. His hemoglobin on 12/12 was 10.7.  Decreased to 8.3 yesterday, has been stable since then.  He does have chronic thrombocytopenia as well as chronic normocytic anemia.  He does not have iron deficiency.  Of note, patient had history of acute liver failure in 04/2019 which he recovered.  Thought to be secondary to drug-induced liver injury after extensive work-up.  Liver biopsy was not performed at that time as his acute hepatitis resolved.  NSAIDs:  None  Antiplts/Anticoagulants/Anti thrombotics: Eliquis  GI Procedures: None  Past Medical History:  Diagnosis Date  . ESRD (end stage renal disease) ([facility])   . Hypertension   . PAD (peripheral artery disease) ([facility])    Required aortofemoral stent-which had closed and had to redo the procedure and ischemia of limb.  . Peripheral vascular disease ([facility])   . Renal disorder   . Secondary hyperparathyroidism of renal origin [facility])     Past Surgical History:  Procedure Laterality Date  . A/V SHUNT INTERVENTION Left 01/19/2019   Procedure: LEFT UPPER EXTREMITY A/V SHUNTOGRAM / UPPER EXTREMITY ANGIOGRAM;  Surgeon: Algernon Huxley, MD;  Location: [place] [facility];  Service: Cardiovascular;  Laterality: Left;  . A/V SHUNT INTERVENTION Left 03/02/2019   Procedure: A/V SHUNT INTERVENTION;  Surgeon: Algernon Huxley, MD;  Location: [place] [facility];  Service: Cardiovascular;  Laterality: Left;  . AORTA - FEMORAL ARTERY BYPASS GRAFT    . AV FISTULA PLACEMENT Left 12/26/2018   Procedure: INSERTION OF GORE STRETCH VASCULAR 4-7MM X  45CM IN LEFT UPPER ARM;  Surgeon: Marty Heck, MD;  Location: [place];  Service: Vascular;  Laterality: Left;  . AV FISTULA PLACEMENT Right 08/12/2019   Procedure: INSERTION OF ARTERIOVENOUS (AV) GORE-TEX GRAFT ARM;  Surgeon: Algernon Huxley, MD;  Location: [facility];  Service: Vascular;  Laterality: Right;  . DIALYSIS/PERMA CATHETER REMOVAL N/A 02/06/2019   Procedure: DIALYSIS/PERMA CATHETER REMOVAL;  Surgeon: Algernon Huxley, MD;  Location:  [place] [facility];  Service: Cardiovascular;  Laterality: N/A;  . LEFT HEART CATH AND CORONARY ANGIOGRAPHY Right 07/10/2019   Procedure: LEFT HEART CATH AND CORONARY ANGIOGRAPHY;  Surgeon: Dionisio David, MD;  Location: [place] [facility];  Service: Cardiovascular;  Laterality: Right;  . PERIPHERAL VASCULAR THROMBECTOMY Left 07/28/2019   Procedure: Left Upper Extremity Dialysis Access [place];  Surgeon: Katha Cabal, MD;  Location:  [place] [facility];  Service: Cardiovascular;  Laterality: Left;  . REMOVAL OF A DIALYSIS CATHETER Right 08/26/2019   Procedure: REMOVAL OF A DIALYSIS CATHETER;  Surgeon: Algernon Huxley, MD;  Location: [facility];  Service: Vascular;  Laterality: Right;  . TEMPORARY DIALYSIS CATHETER N/A 08/25/2019   Procedure: TEMPORARY DIALYSIS CATHETER;  Surgeon: Katha Cabal, MD;  Location: [place] [facility];  Service: Cardiovascular;  Laterality: N/A;  . UPPER EXTREMITY ANGIOGRAPHY Left 08/13/2019   Procedure: UPPER EXTREMITY ANGIOGRAPHy;  Surgeon: Algernon Huxley, MD;  Location: [place] [facility];  Service: Cardiovascular;  Laterality: Left;    Prior to Admission medications   Medication Sig Start Date End Date Taking? Authorizing Provider  amLODipine (NORVASC) 5 MG tablet Take 1 tablet (5 mg total) by mouth daily. 08/28/19   Danford, Suann Larry, MD  apixaban (ELIQUIS) 2.5 MG TABS tablet Take 1 tablet (2.5 mg total) by mouth 2 (two) times daily. 01/26/19   Clapacs, Madie Reno, MD  atorvastatin (LIPITOR) 80 MG tablet Take 1 tablet (80 mg total) by mouth at bedtime. 01/26/19   Clapacs, Madie Reno, MD  b complex-C-folic acid 1 MG capsule Take 1 capsule by mouth daily after supper.     [provider]  carvedilol (COREG) 25 MG tablet Take 1 tablet (25 mg total) by mouth 2 (two) times daily with a meal. 01/26/19   Clapacs, Madie Reno, MD  gabapentin (NEURONTIN) 100 MG capsule Take 1 capsule (100 mg total) by mouth 3 (three) times daily. 01/26/19   Clapacs, Madie Reno, MD  hydrALAZINE (APRESOLINE) 25 MG tablet Take 1 tablet (25 mg total) by mouth every 8 (eight) hours. 01/26/19   Clapacs, Madie Reno, MD  hydrOXYzine (ATARAX/VISTARIL) 25 MG tablet Take 25 mg by mouth 3 (three) times daily as needed for anxiety.    [provider]  levETIRAcetam (KEPPRA) 500 MG tablet Take 1 tablet (500 mg total) by mouth 2 (two) times daily. 08/17/19   Mayo, Pete Pelt, MD  levofloxacin (LEVAQUIN) 500 MG tablet Take 1 tablet (500 mg  total) by mouth every other day. 08/29/19   Danford, Suann Larry, MD  loratadine (CLARITIN) 10 MG tablet Take 1 tablet (10 mg total) by mouth daily. 03/04/19   Salary, Holly Bodily D, MD  multivitamin (RENA-VIT) TABS tablet Take 1 tablet by mouth daily. 01/27/19   Clapacs, Madie Reno, MD  neomycin-bacitracin-polymyxin (NEOSPORIN) OINT Apply 1 application topically 2 (two) times daily. Patient taking differently: Apply 1 application topically as needed.  01/26/19   Clapacs, Madie Reno, MD  Nutritional Supplements (FEEDING SUPPLEMENT, NEPRO CARB STEADY,) LIQD Take 237 mLs by mouth 2 (two) times daily between meals. 07/06/19   Epifanio Lesches, MD  sevelamer carbonate (RENVELA) 800 MG tablet Take 2 tablets (1,600 mg total) by mouth 3 (three) times daily with meals. 01/26/19   Clapacs, Madie Reno, MD    Current Facility-Administered Medications:  .  acetaminophen (TYLENOL) tablet 650 mg, 650 mg, Oral, Q6H PRN, 650 mg at 10/05/19 0840 **OR** acetaminophen (TYLENOL) suppository 650 mg, 650 mg, Rectal, Q6H PRN, Cruzita Lederer,  Vella Redhead, MD .  Derrill Memo ON 10/06/2019] ascorbic acid (VITAMIN C) tablet 500 mg, 500 mg, Oral, BID, Memon, Jehanzeb, MD .  atorvastatin (LIPITOR) tablet 80 mg, 80 mg, Oral, QHS, Caren Griffins, MD, 80 mg at 10/03/19 2203 .  Chlorhexidine Gluconate Cloth 2 % PADS 6 each, 6 each, Topical, Q0600, Murlean Iba, MD .  Derrill Memo ON 10/06/2019] epoetin alfa (EPOGEN) injection 4,000 Units, 4,000 Units, Intravenous, Q T,Th,Sa-HD, Lateef, Munsoor, MD .  feeding supplement (NEPRO CARB STEADY) liquid 237 mL, 237 mL, Oral, BID BM, Gherghe, Costin M, MD, 237 mL at 10/04/19 1147 .  gabapentin (NEURONTIN) capsule 100 mg, 100 mg, Oral, TID, Caren Griffins, MD, 100 mg at 10/05/19 1645 .  guaiFENesin-dextromethorphan (ROBITUSSIN DM) 100-10 MG/5ML syrup 5 mL, 5 mL, Oral, Q4H PRN, Kathie Dike, MD, 5 mL at 10/04/19 1147 .  hydrOXYzine (ATARAX/VISTARIL) tablet 25 mg, 25 mg, Oral, TID PRN, Caren Griffins, MD .   levETIRAcetam (KEPPRA) IVPB 500 mg/100 mL premix, 500 mg, Intravenous, Q12H, Kathie Dike, MD, Stopped at 10/05/19 [phone] .  loratadine (CLARITIN) tablet 10 mg, 10 mg, Oral, Daily, Caren Griffins, MD, 10 mg at 10/04/19 0843 .  multivitamin (RENA-VIT) tablet 1 tablet, 1 tablet, Oral, QHS, Caren Griffins, MD, 1 tablet at 10/03/19 2204 .  neomycin-bacitracin-polymyxin (NEOSPORIN) ointment 1 application, 1 application, Topical, PRN, Gherghe, Costin M, MD .  ondansetron (ZOFRAN) tablet 4 mg, 4 mg, Oral, Q6H PRN **OR** ondansetron (ZOFRAN) injection 4 mg, 4 mg, Intravenous, Q6H PRN, Caren Griffins, MD, 4 mg at 10/04/19 0844 .  pantoprazole (PROTONIX) injection 40 mg, 40 mg, Intravenous, Q12H, Memon, Jolaine Artist, MD, 40 mg at 10/05/19 0840 .  phenol (CHLORASEPTIC) mouth spray 1 spray, 1 spray, Mouth/Throat, PRN, Memon, Jehanzeb, MD .  sevelamer carbonate (RENVELA) tablet 1,600 mg, 1,600 mg, Oral, TID WC, Gherghe, Costin M, MD, 1,600 mg at 10/05/19 1646 .  sodium chloride flush (NS) 0.9 % injection 3 mL, 3 mL, Intravenous, Once, Delman Kitten, MD  Family History  Problem Relation Age of Onset  . Hypertension Other   . Diabetes Other   . Clotting disorder Father      Social History   Tobacco Use  . Smoking status: Current Every Day Smoker    Packs/day: 0.50    Years: 30.00    Pack years: 15.00    Types: Cigarettes    Last attempt to quit: 06/26/2018    Years since quitting: 1.2  . Smokeless tobacco: Never Used  . Tobacco comment: smoked for 30 years   Substance Use Topics  . Alcohol use: Not Currently  . Drug use: Yes    Frequency: 1.0 times per week    Types: Marijuana    Comment: Pt states "maybe once or twice a week".     Allergies as of 10/03/2019 - Review Complete 10/03/2019  Allergen Reaction Noted  . Codeine Nausea Only 09/22/2018  . Sulfa antibiotics Hives and Nausea And Vomiting 08/29/2018    Review of Systems:    All systems reviewed and negative except where noted in  HPI.   Physical Exam:  Vital signs in last 24 hours: Temp:  [97.5 F (36.4 C)-98.3 F (36.8 C)] 98.1 F (36.7 C) (12/14 1203) Pulse Rate:  [78-80] 79 (12/14 1203) Resp:  [16-18] 16 (12/14 1203) BP: (100-122)/(69-93) 122/93 (12/14 1203) SpO2:  [92 %-94 %] 94 % (12/14 1203) Last BM Date: 10/03/19 General:   Pleasant, cooperative in NAD Head:  Normocephalic and atraumatic. Eyes:  No icterus.   Conjunctiva pink. PERRLA. Ears:  Normal auditory acuity. Neck:  Supple; no masses or thyroidomegaly Lungs: Respirations even and unlabored. Lungs clear to auscultation bilaterally.   No wheezes, crackles, or rhonchi.  Heart:  Regular rate and rhythm;  Without murmur, clicks, rubs or gallops Abdomen:  Soft, nondistended, nontender. Normal bowel sounds. No appreciable masses or hepatomegaly.  No rebound or guarding.  Rectal:  Not performed. Msk:  Symmetrical without gross deformities.  Strength  Extremities:  Without edema, cyanosis or clubbing. Neurologic:  Alert and oriented x3;  grossly normal neurologically. Skin:  Intact without significant lesions or rashes. Cervical Nodes:  No significant cervical adenopathy. Psych:  Alert and cooperative. Normal affect.  LAB RESULTS: CBC Latest Ref Rng & Units 10/05/2019 10/04/2019 10/04/2019  WBC 4.0 - 10.5 K/uL 8.8 9.2 10.3  Hemoglobin 13.0 - 17.0 g/dL 8.3(L) 8.2(L) 8.3(L)  Hematocrit 39.0 - 52.0 % 25.1(L) 24.5(L) 25.4(L)  Platelets 150 - 400 K/uL 79(L) 78(L) 79(L)    BMET BMP Latest Ref Rng & Units 10/05/2019 10/04/2019 10/03/2019  Glucose 70 - 99 mg/dL 93 111(H) 104(H)  BUN 6 - 20 mg/dL 52(H) 30(H) 42(H)  Creatinine 0.61 - 1.24 mg/dL 4.25(H) 3.17(H) 4.45(H)  Sodium 135 - 145 mmol/L 135 133(L) 132(L)  Potassium 3.5 - 5.1 mmol/L 3.5 3.5 4.2  Chloride 98 - 111 mmol/L 95(L) 95(L) 90(L)  CO2 22 - 32 mmol/L 26 25 14(L)  Calcium 8.9 - 10.3 mg/dL 8.0(L) 8.5(L) 9.1    LFT Hepatic Function Latest Ref Rng & Units 10/05/2019 10/04/2019 08/28/2019   Total Protein 6.5 - 8.1 g/dL - 5.7(L) 4.9(L)  Albumin 3.5 - 5.0 g/dL 2.3(L) 2.6(L) 2.4(L)  AST 15 - 41 U/L - 28 56(H)  ALT 0 - 44 U/L - 37 96(H)  Alk Phosphatase 38 - 126 U/L - 98 88  Total Bilirubin 0.3 - 1.2 mg/dL - 3.9(H) 1.9(H)  Bilirubin, Direct 0.0 - 0.2 mg/dL - - -     STUDIES: DG Abd 1 View  Result Date: 10/04/2019 CLINICAL DATA:  NG tube placement EXAM: ABDOMEN - 1 VIEW COMPARISON:  10/04/2019 FINDINGS: Interval placement of esophagogastric tube, tip near the gastroesophageal junction and side port above the diaphragm. Recommend advancement to ensure subdiaphragmatic positioning of tip and side port. Nonobstructive pattern of bowel gas in the included abdomen. No obvious free air. Stent projects over the central abdomen. Cardiomegaly. IMPRESSION: Interval placement of esophagogastric tube, tip near the gastroesophageal junction and side port above the diaphragm. Recommend advancement to ensure subdiaphragmatic positioning of tip and side port. Electronically Signed   By: Eddie Candle M.D.   On: 10/04/2019 17:10   DG Abd 2 Views  Result Date: 10/04/2019 CLINICAL DATA:  Vomiting. EXAM: ABDOMEN - 2 VIEW COMPARISON:  08/25/2019 FINDINGS: There is no bowel dilation to suggest obstruction. No air-fluid levels or free air. Vascular stent overlies the upper to mid lumbar spine. A right common iliac stent is present. These are stable from prior study. There are surgical vascular clips in the central abdomen and left lower quadrant. These are also unchanged. No evidence of renal or ureteral stones. There is opacity at the left lung base, incompletely imaged. Skeletal structures unremarkable. IMPRESSION: 1. No evidence of bowel obstruction or free air.  No acute findings. Electronically Signed   By: Lajean Manes M.D.   On: 10/04/2019 16:11      Impression / Plan:   Amyr Sluder is a 43 y.o. male with history of marijuana use, end-stage  renal disease on hemodialysis, peripheral artery  disease on Eliquis is admitted with acute bronchitis.  GI is consulted for an episode of coffee-ground emesis.  His hemoglobin is stable in last 48 hours.  He had temporary NG tube placement due to concern for small bowel obstruction, his episode of nausea, vomiting and abdominal distention have resolved.  Also, tolerating clear liquids, having bowel movements.  X-ray KUB did not reveal evidence of small bowel obstruction.  Coffee-ground emesis Differentials include erosive esophagitis or esophageal ulcer or gastritis or peptic ulcer disease or Mallory-Weiss tear Continue Protonix 40 mg IV twice daily Diet as tolerated Monitor CBC closely and transfuse as needed Recommend to hold Eliquis for next 48 hours, tentative plan to perform EGD on Wednesday if okay with anesthesia given his active bronchitis, respiratory panel and SARS COVID-19 PCR negative  History of acute liver failure, chronic intermittent thrombocytopenia No evidence of cirrhosis Follow-up with GI/hematology as outpatient  Thank you for involving me in the care of this patient.      LOS: 1 day   Sherri Sear, MD  10/05/2019, 6:29 PM   Note: This dictation was prepared with Dragon dictation along with smaller phrase technology. Any transcriptional errors that result from this process are unintentional.

## 2019-10-05 NOTE — Progress Notes (Signed)
Initial Nutrition Assessment  DOCUMENTATION CODES:   Non-severe (moderate) malnutrition in context of chronic illness  INTERVENTION:   Nepro Shake po BID, each supplement provides 425 kcal and 19 grams protein  Rena-vite daily   Vitamin C 500mg  po BID   NUTRITION DIAGNOSIS:   Moderate Malnutrition related to chronic illness(ESRD on HD) as evidenced by moderate fat depletion, moderate muscle depletion.  GOAL:   Patient will meet greater than or equal to 90% of their needs  MONITOR:   PO intake, Supplement acceptance, Labs, Weight trends, Skin, I & O's  REASON FOR ASSESSMENT:   Other (Comment)(Low BMI)    ASSESSMENT:   43 year old male with a history of end-stage renal disease on hemodialysis, chronic systolic congestive heart failure with ejection fraction of less than 20%, MDD, NSTEMI, seizures admitted to the hospital with shortness of breath. Pt with possible bronchitis and also found to have plueral effusion. Pt developed nausea and vomiting of coffee-ground emesis in hospital.  Pt is known to nutrition department from previous admissions. Patient is not a very good historian. Pt reports poor appetite and oral intake pta. Pt eating 100% of meals during his last admit. Pt also eating 100% of meals prior to NPO. Recommend continue supplements and vitamins. Will add Vitamin C in setting of GIB as patient at increased risk for scurvy r/t chronic HD. Per chart, pt is weight stable pta.   Medications reviewed and include: rena-vite, protonix, renvela  Labs reviewed: BUN 52(H), creat 4.25(H) Hgb 8.3(L), Hct 25.1(L)  NUTRITION - FOCUSED PHYSICAL EXAM:    Most Recent Value  Orbital Region  Moderate depletion  Upper Arm Region  Moderate depletion  Thoracic and Lumbar Region  Mild depletion  Buccal Region  Moderate depletion  Temple Region  Moderate depletion  Clavicle Bone Region  Mild depletion  Clavicle and Acromion Bone Region  Mild depletion  Scapular Bone Region   Moderate depletion  Dorsal Hand  Mild depletion  Patellar Region  Moderate depletion  Anterior Thigh Region  Moderate depletion  Posterior Calf Region  Moderate depletion  Edema (RD Assessment)  None  Hair  Reviewed  Eyes  Reviewed  Mouth  Reviewed  Skin  Reviewed  Nails  Reviewed     Diet Order:   Diet Order            Diet clear liquid Room service appropriate? Yes; Fluid consistency: Thin  Diet effective now             EDUCATION NEEDS:   Education needs have been addressed  Skin:  Skin Assessment: Reviewed RN Assessment  Last BM:  12/12  Height:   Ht Readings from Last 1 Encounters:  10/03/19 5\' 1"  (1.549 m)    Weight:   Wt Readings from Last 1 Encounters:  10/03/19 44.5 kg    Ideal Body Weight:  50.9 kg  BMI:  Body mass index is 18.54 kg/m.  Estimated Nutritional Needs:   Kcal:  1500-1700kcal/day  Protein:  70-80g/day  Fluid:  UOP +1L  Koleen Distance MS, RD, LDN Pager #- [phone] Office#- [phone] After Hours Pager: [phone]

## 2019-10-05 NOTE — Progress Notes (Signed)
Per MD okay for RN to remove NG tube,

## 2019-10-05 NOTE — Progress Notes (Signed)
Per Dr. Marius Ditch okay for RN to advance pt diet to full liquids.

## 2019-10-05 NOTE — Progress Notes (Signed)
Established hemodialysis patient known at [place] 11:00, transports with [facility].  Please contact me with any dialysis placement concerns.  [name] Dialysis Coordinator [phone]

## 2019-10-05 NOTE — Progress Notes (Signed)
PROGRESS NOTE    Brett Small  [id number] DOB: August 05, 1976 DOA: 10/03/2019 PCP: Patient, [name]    Brief Narrative:  43 year old male with a history of end-stage renal disease on hemodialysis, chronic systolic congestive heart failure with ejection fraction of less than 20%, admitted to the hospital with shortness of breath.  Initially felt to have possible bronchitis he was started on levofloxacin.  He also underwent dialysis which improved her shortness of breath.  Subsequently the following day, he developed nausea and vomiting after eating and subsequently became hypotensive.  Blood pressure was in the 80s.  Staff reported possible coffee-ground emesis.  Abdomen was noted to be somewhat distended.  NG tube was placed and he was made NPO.  We will continue to cycle CBC to follow hemoglobin.  He has been started on Protonix.   Assessment & Plan:   Active Problems:   End stage renal disease on dialysis (HCC)   Thrombocytopenia (HCC)   Coagulopathy (HCC)   Pleural effusion   Seizure (HCC)   Chronic systolic CHF (congestive heart failure) (HCC)   GERD (gastroesophageal reflux disease)   Secondary hyperparathyroidism (HCC)   Bronchitis   Vomiting   Arm DVT (deep venous thromboembolism), acute, right (HCC)   Hypotension   1. Hypotension.  Unclear etiology.  Patient did have vomiting with questions of coffee-ground.  Question GI bleeding.  Patient was also prescribed Coreg 25 mg twice daily as well as hydralazine.  He reports that he was not taking these medications at home for quite some time.  These home medications were continued on admission.  Question medication effect leading to hypotension.  He received a small bolus of IV fluids and blood pressures have improved into the 90s.  Continue to monitor closely. 2. Nausea and vomiting, possible GI bleeding.  Staff reported noticing coffee-ground emesis.  Hemoglobin has had a mild decline from this morning to 8.3 this afternoon.   Follow-up hemoglobins have been stable.  Transfuse as necessary.  He has been started on Protonix twice daily.  He has not had any further episodes of emesis.  GI following.  Probable endoscopy on 12/16.  Restarted on clear liquids, advance as tolerated. 3. Chronic systolic congestive heart failure.  Latest echocardiogram shows ejection fraction of less than 20%.  Will need to monitor volume status closely.  He is chronically on beta-blockers, but this is been held due to hypotension. 4. End-stage renal disease on hemodialysis.  Last dialysis session was 12/12.  Nephrology following 5. Seizure disorder.  Chronically on Keppra.   6. Acute bronchitis.  Initial concerns for bronchitis on admission.  He was started on levofloxacin.  No signs of wheezing or cough at this time, will hold off on steroids. 7. Pleural effusion.  Patient noted to have left-sided pleural effusion which is a chronic finding.  Suspect is related to CHF.  He does not appear to be particularly short of breath at this time.  Continue to monitor. 8. Recent right upper extremity DVT.  Started on Eliquis during recent admission.  Hold Eliquis for now in light of possible GI bleeding. 9. Thrombocytopenia.  Appears to be present for the past several weeks/months.  Platelet count, although low appears to be stable at this time.  Continue to monitor.   DVT prophylaxis: SCDs Code Status: Full code Family Communication: None present Disposition Plan: Discharge home once hypotension has improved   Consultants:   Neurology  Procedures:     Antimicrobials:   Levofloxacin 12/12.  Subjective: He is feeling better today.  No longer nauseous.  No abdominal pain.  NG tube been removed.  He wants to advance his diet.  Objective: Vitals:   10/04/19 1825 10/04/19 2045 10/05/19 0440 10/05/19 1203  BP: 96/72 107/74 100/69 (!) 122/93  Pulse: 73 78 80 79  Resp:  16 18 16   Temp:  98.3 F (36.8 C) (!) 97.5 F (36.4 C) 98.1 F (36.7  C)  TempSrc:  Oral Oral Oral  SpO2:  94% 92% 94%  Weight:      Height:        Intake/Output Summary (Last 24 hours) at 10/05/2019 1957 Last data filed at 10/05/2019 1605 Gross per 24 hour  Intake 200 ml  Output 350 ml  Net -150 ml   Filed Weights   10/03/19 1145 10/03/19 1630  Weight: 44.5 kg 44.5 kg    Examination:  General exam: Alert, awake, oriented x 3 Respiratory system: Diminished breath sounds at left base. Respiratory effort normal. Cardiovascular system:RRR. No murmurs, rubs, gallops. Gastrointestinal system: Abdomen is nondistended, soft and nontender. No organomegaly or masses felt. Normal bowel sounds heard. Central nervous system: Alert and oriented. No focal neurological deficits. Extremities: No C/C/E, +pedal pulses Skin: No rashes, lesions or ulcers Psychiatry: Judgement and insight appear normal. Mood & affect appropriate.      Data Reviewed: I have personally reviewed following labs and imaging studies  CBC: Recent Labs  Lab 10/03/19 1333 10/04/19 0615 10/04/19 1543 10/04/19 2309 10/05/19 0543  WBC 13.9* 10.1 10.3 9.2 8.8  HGB 10.7* 9.4* 8.3* 8.2* 8.3*  HCT 36.2* 29.7* 25.4* 24.5* 25.1*  MCV 100.0 93.1 90.4 88.1 88.4  PLT 118* 71* 79* 78* 79*   Basic Metabolic Panel: Recent Labs  Lab 10/03/19 1333 10/04/19 0615 10/05/19 0543  NA 132* 133* 135  K 4.2 3.5 3.5  CL 90* 95* 95*  CO2 14* 25 26  GLUCOSE 104* 111* 93  BUN 42* 30* 52*  CREATININE 4.45* 3.17* 4.25*  CALCIUM 9.1 8.5* 8.0*  PHOS  --   --  4.3   GFR: Estimated Creatinine Clearance: 13.8 mL/min (A) (by C-G formula based on SCr of 4.25 mg/dL (H)). Liver Function Tests: Recent Labs  Lab 10/04/19 0615 10/05/19 0543  AST 28  --   ALT 37  --   ALKPHOS 98  --   BILITOT 3.9*  --   PROT 5.7*  --   ALBUMIN 2.6* 2.3*   No results for input(s): LIPASE, AMYLASE in the last 168 hours. No results for input(s): AMMONIA in the last 168 hours. Coagulation Profile: No results for  input(s): INR, PROTIME in the last 168 hours. Cardiac Enzymes: Recent Labs  Lab 10/03/19 1456  CKTOTAL 33*   BNP (last 3 results) No results for input(s): PROBNP in the last 8760 hours. HbA1C: No results for input(s): HGBA1C in the last 72 hours. CBG: Recent Labs  Lab 10/04/19 0837 10/04/19 1146  GLUCAP 113* 165*   Lipid Profile: No results for input(s): CHOL, HDL, LDLCALC, TRIG, CHOLHDL, LDLDIRECT in the last 72 hours. Thyroid Function Tests: No results for input(s): TSH, T4TOTAL, FREET4, T3FREE, THYROIDAB in the last 72 hours. Anemia Panel: No results for input(s): VITAMINB12, FOLATE, FERRITIN, TIBC, IRON, RETICCTPCT in the last 72 hours. Sepsis Labs: Recent Labs  Lab 10/03/19 2108 10/04/19 0052  LATICACIDVEN 4.5* 2.1*    Recent Results (from the past 240 hour(s))  Respiratory Panel by RT PCR (Flu A&B, Covid) - Nasopharyngeal Swab  Status: None   Collection Time: 10/03/19  1:38 PM   Specimen: Nasopharyngeal Swab  Result Value Ref Range Status   SARS Coronavirus 2 by RT PCR NEGATIVE NEGATIVE Final    Comment: (NOTE) SARS-CoV-2 target nucleic acids are NOT DETECTED. The SARS-CoV-2 RNA is generally detectable in upper respiratoy specimens during the acute phase of infection. The lowest concentration of SARS-CoV-2 viral copies this assay can detect is 131 copies/mL. A negative result does not preclude SARS-Cov-2 infection and should not be used as the sole basis for treatment or other patient management decisions. A negative result may occur with  improper specimen collection/handling, submission of specimen other than nasopharyngeal swab, presence of viral mutation(s) within the areas targeted by this assay, and inadequate number of viral copies (<131 copies/mL). A negative result must be combined with clinical observations, patient history, and epidemiological information. The expected result is Negative. Fact Sheet for Patients:    PinkCheek.be Fact Sheet for Healthcare Providers:  GravelBags.it This test is not yet ap proved or cleared by the [place] FDA and  has been authorized for detection and/or diagnosis of SARS-CoV-2 by FDA under an Emergency Use Authorization (EUA). This EUA will remain  in effect (meaning this test can be used) for the duration of the COVID-19 declaration under Section 564(b)(1) of the Act, 21 U.S.C. section 360bbb-3(b)(1), unless the authorization is terminated or revoked sooner.    Influenza A by PCR NEGATIVE NEGATIVE Final   Influenza B by PCR NEGATIVE NEGATIVE Final    Comment: (NOTE) The Xpert Xpress SARS-CoV-2/FLU/RSV assay is intended as an aid in  the diagnosis of influenza from Nasopharyngeal swab specimens and  should not be used as a sole basis for treatment. Nasal washings and  aspirates are unacceptable for Xpert Xpress SARS-CoV-2/FLU/RSV  testing. Fact Sheet for Patients: PinkCheek.be Fact Sheet for Healthcare Providers: GravelBags.it This test is not yet approved or cleared by the [place] FDA and  has been authorized for detection and/or diagnosis of SARS-CoV-2 by  FDA under an Emergency Use Authorization (EUA). This EUA will remain  in effect (meaning this test can be used) for the duration of the  Covid-19 declaration under Section 564(b)(1) of the Act, 21  U.S.C. section 360bbb-3(b)(1), unless the authorization is  terminated or revoked. Performed at [facility], [address] [place], [place] [address]          Radiology Studies: DG Abd 1 View  Result Date: 10/04/2019 CLINICAL DATA:  NG tube placement EXAM: ABDOMEN - 1 VIEW COMPARISON:  10/04/2019 FINDINGS: Interval placement of esophagogastric tube, tip near the gastroesophageal junction and side port above the diaphragm. Recommend advancement to ensure  subdiaphragmatic positioning of tip and side port. Nonobstructive pattern of bowel gas in the included abdomen. No obvious free air. Stent projects over the central abdomen. Cardiomegaly. IMPRESSION: Interval placement of esophagogastric tube, tip near the gastroesophageal junction and side port above the diaphragm. Recommend advancement to ensure subdiaphragmatic positioning of tip and side port. Electronically Signed   By: Eddie Candle M.D.   On: 10/04/2019 17:10   DG Abd 2 Views  Result Date: 10/04/2019 CLINICAL DATA:  Vomiting. EXAM: ABDOMEN - 2 VIEW COMPARISON:  08/25/2019 FINDINGS: There is no bowel dilation to suggest obstruction. No air-fluid levels or free air. Vascular stent overlies the upper to mid lumbar spine. A right common iliac stent is present. These are stable from prior study. There are surgical vascular clips in the central abdomen and left lower quadrant.  These are also unchanged. No evidence of renal or ureteral stones. There is opacity at the left lung base, incompletely imaged. Skeletal structures unremarkable. IMPRESSION: 1. No evidence of bowel obstruction or free air.  No acute findings. Electronically Signed   By: Lajean Manes M.D.   On: 10/04/2019 16:11        Scheduled Meds: . [START ON 10/06/2019] vitamin C  500 mg Oral BID  . atorvastatin  80 mg Oral QHS  . Chlorhexidine Gluconate Cloth  6 each Topical Q0600  . [START ON 10/06/2019] epoetin (EPOGEN/PROCRIT) injection  4,000 Units Intravenous Q T,Th,Sa-HD  . feeding supplement (NEPRO CARB STEADY)  237 mL Oral BID BM  . gabapentin  100 mg Oral TID  . loratadine  10 mg Oral Daily  . multivitamin  1 tablet Oral QHS  . pantoprazole (PROTONIX) IV  40 mg Intravenous Q12H  . sevelamer carbonate  1,600 mg Oral TID WC  . sodium chloride flush  3 mL Intravenous Once   Continuous Infusions: . levETIRAcetam 500 mg (10/05/19 1840)     LOS: 1 day    Time spent: 30 minutes  Kathie Dike, MD [facility]   If 7PM-7AM, please contact night-coverage [email or web address]  10/05/2019, 7:57 PM

## 2019-10-05 NOTE — Progress Notes (Signed)
[facility], [place] 10/05/19  Subjective:  NG tube has been removed. Patient due for dialysis again tomorrow.   Objective:  Vital signs in last 24 hours:  Temp:  [97.5 F (36.4 C)-98.3 F (36.8 C)] 98.1 F (36.7 C) (12/14 1203) Pulse Rate:  [73-80] 79 (12/14 1203) Resp:  [16-18] 16 (12/14 1203) BP: (96-122)/(69-93) 122/93 (12/14 1203) SpO2:  [92 %-94 %] 94 % (12/14 1203)  Weight change:  Filed Weights   10/03/19 1145 10/03/19 1630  Weight: 44.5 kg 44.5 kg    Intake/Output:    Intake/Output Summary (Last 24 hours) at 10/05/2019 1553 Last data filed at 10/05/2019 0600 Gross per 24 hour  Intake 604.17 ml  Output 350 ml  Net 254.17 ml     Physical Exam: General:  Chronically ill-appearing, laying in the bed  HEENT  anicteric, moist oral mucous membranes  Pulm/lungs  mild crackles at bases  CVS/Heart  S1S2 no rubs  Abdomen:   Soft, nontender  Extremities:  1+ edema left leg  Neurologic:  Alert, able to answer questions  Skin:  No acute rashes  Access:  Right IJ PermCath       Basic Metabolic Panel:  Recent Labs  Lab 10/03/19 1333 10/04/19 0615 10/05/19 0543  NA 132* 133* 135  K 4.2 3.5 3.5  CL 90* 95* 95*  CO2 14* 25 26  GLUCOSE 104* 111* 93  BUN 42* 30* 52*  CREATININE 4.45* 3.17* 4.25*  CALCIUM 9.1 8.5* 8.0*  PHOS  --   --  4.3     CBC: Recent Labs  Lab 10/03/19 1333 10/04/19 0615 10/04/19 1543 10/04/19 2309 10/05/19 0543  WBC 13.9* 10.1 10.3 9.2 8.8  HGB 10.7* 9.4* 8.3* 8.2* 8.3*  HCT 36.2* 29.7* 25.4* 24.5* 25.1*  MCV 100.0 93.1 90.4 88.1 88.4  PLT 118* 71* 79* 78* 79*      Lab Results  Component Value Date   HEPBSAG NON REACTIVE 10/03/2019   HEPBSAB Non Reactive 12/25/2018   HEPBIGM NON REACTIVE 10/03/2019      Microbiology:  Recent Results (from the past 240 hour(s))  Respiratory Panel by RT PCR (Flu A&B, Covid) - Nasopharyngeal Swab     Status: None   Collection Time: 10/03/19  1:38 PM   Specimen: Nasopharyngeal Swab  Result Value Ref Range Status   SARS Coronavirus 2 by RT PCR NEGATIVE NEGATIVE Final    Comment: (NOTE) SARS-CoV-2 target nucleic acids are NOT DETECTED. The SARS-CoV-2 RNA is generally detectable in upper respiratoy specimens during the acute phase of infection. The lowest concentration of SARS-CoV-2 viral copies this assay can detect is 131 copies/mL. A negative result does not preclude SARS-Cov-2 infection and should not be used as the sole basis for treatment or other patient management decisions. A negative result may occur with  improper specimen collection/handling, submission of specimen other than nasopharyngeal swab, presence of viral mutation(s) within the areas targeted by this assay, and inadequate number of viral copies (<131 copies/mL). A negative result must be combined with clinical observations, patient history, and epidemiological information. The expected result is Negative. Fact Sheet for Patients:  PinkCheek.be Fact Sheet for Healthcare Providers:  GravelBags.it This test is not yet ap proved or cleared by the [place] FDA and  has been authorized for detection and/or diagnosis of SARS-CoV-2 by FDA under an Emergency Use Authorization (EUA). This EUA will remain  in effect (meaning this test can be used) for the duration of the COVID-19 declaration under Section  564(b)(1) of the Act, 21 U.S.C. section 360bbb-3(b)(1), unless the authorization is terminated or revoked sooner.    Influenza A by PCR NEGATIVE NEGATIVE Final   Influenza B by PCR NEGATIVE NEGATIVE Final    Comment: (NOTE) The Xpert Xpress SARS-CoV-2/FLU/RSV assay is intended as an aid in  the diagnosis of influenza from Nasopharyngeal swab specimens and  should not be used as a sole basis for treatment. Nasal washings and  aspirates are unacceptable for Xpert Xpress SARS-CoV-2/FLU/RSV  testing. Fact Sheet  for Patients: PinkCheek.be Fact Sheet for Healthcare Providers: GravelBags.it This test is not yet approved or cleared by the [place] FDA and  has been authorized for detection and/or diagnosis of SARS-CoV-2 by  FDA under an Emergency Use Authorization (EUA). This EUA will remain  in effect (meaning this test can be used) for the duration of the  Covid-19 declaration under Section 564(b)(1) of the Act, 21  U.S.C. section 360bbb-3(b)(1), unless the authorization is  terminated or revoked. Performed at [facility], [address] [place], [place] [address]     Coagulation Studies: No results for input(s): LABPROT, INR in the last 72 hours.  Urinalysis: No results for input(s): COLORURINE, LABSPEC, PHURINE, GLUCOSEU, HGBUR, BILIRUBINUR, KETONESUR, PROTEINUR, UROBILINOGEN, NITRITE, LEUKOCYTESUR in the last 72 hours.  Invalid input(s): APPERANCEUR    Imaging: DG Abd 1 View  Result Date: 10/04/2019 CLINICAL DATA:  NG tube placement EXAM: ABDOMEN - 1 VIEW COMPARISON:  10/04/2019 FINDINGS: Interval placement of esophagogastric tube, tip near the gastroesophageal junction and side port above the diaphragm. Recommend advancement to ensure subdiaphragmatic positioning of tip and side port. Nonobstructive pattern of bowel gas in the included abdomen. No obvious free air. Stent projects over the central abdomen. Cardiomegaly. IMPRESSION: Interval placement of esophagogastric tube, tip near the gastroesophageal junction and side port above the diaphragm. Recommend advancement to ensure subdiaphragmatic positioning of tip and side port. Electronically Signed   By: Eddie Candle M.D.   On: 10/04/2019 17:10   DG Abd 2 Views  Result Date: 10/04/2019 CLINICAL DATA:  Vomiting. EXAM: ABDOMEN - 2 VIEW COMPARISON:  08/25/2019 FINDINGS: There is no bowel dilation to suggest obstruction. No air-fluid levels or free air. Vascular stent  overlies the upper to mid lumbar spine. A right common iliac stent is present. These are stable from prior study. There are surgical vascular clips in the central abdomen and left lower quadrant. These are also unchanged. No evidence of renal or ureteral stones. There is opacity at the left lung base, incompletely imaged. Skeletal structures unremarkable. IMPRESSION: 1. No evidence of bowel obstruction or free air.  No acute findings. Electronically Signed   By: Lajean Manes M.D.   On: 10/04/2019 16:11     Medications:   . levETIRAcetam 500 mg (10/05/19 0627)   . [START ON 10/06/2019] vitamin C  500 mg Oral BID  . atorvastatin  80 mg Oral QHS  . Chlorhexidine Gluconate Cloth  6 each Topical Q0600  . feeding supplement (NEPRO CARB STEADY)  237 mL Oral BID BM  . gabapentin  100 mg Oral TID  . loratadine  10 mg Oral Daily  . multivitamin  1 tablet Oral QHS  . pantoprazole (PROTONIX) IV  40 mg Intravenous Q12H  . sevelamer carbonate  1,600 mg Oral TID WC  . sodium chloride flush  3 mL Intravenous Once   acetaminophen **OR** acetaminophen, guaiFENesin-dextromethorphan, hydrOXYzine, neomycin-bacitracin-polymyxin, ondansetron **OR** ondansetron (ZOFRAN) IV, phenol  Assessment/ Plan:  43 y.o. male with end-stage  renal disease on hemodialysis, hypertension, peripheral vascular disease, depression, history of PEA event October 2020, current smoker  [facility] [address]/TTS/9 IJ catheter  1.  Cough/congestion, shortness of breath -Management as per hospitalist.  2. End-stage renal disease -Last dialysis treatment was performed on Saturday.  No urgent indication for dialysis at the moment.  We will arrange for dialysis treatment for tomorrow.   3. Anemia of chronic kidney disease Lab Results  Component Value Date   HGB 8.3 (L) 10/05/2019  -Start Epogen 4000 IV with dialysis treatment tomorrow.  4.  Secondary hyperparathyroidism.  Maintain the patient on Renvela 2 tablets p.o. 3 times  daily with meals and recheck serum phosphorus tomorrow.     LOS: 1 Kaamil Morefield 12/14/20203:53 PM  [place], [place]  Note: This note was prepared with Dragon dictation. Any transcription errors are unintentional

## 2019-10-06 LAB — CBC
HCT: 26.1 % — ABNORMAL LOW (ref 39.0–52.0)
Hemoglobin: 8.7 g/dL — ABNORMAL LOW (ref 13.0–17.0)
MCH: 29.2 pg (ref 26.0–34.0)
MCHC: 33.3 g/dL (ref 30.0–36.0)
MCV: 87.6 fL (ref 80.0–100.0)
Platelets: 94 10*3/uL — ABNORMAL LOW (ref 150–400)
RBC: 2.98 MIL/uL — ABNORMAL LOW (ref 4.22–5.81)
RDW: 18.6 % — ABNORMAL HIGH (ref 11.5–15.5)
WBC: 10.8 10*3/uL — ABNORMAL HIGH (ref 4.0–10.5)
nRBC: 0.2 % (ref 0.0–0.2)

## 2019-10-06 LAB — RENAL FUNCTION PANEL
Albumin: 2.4 g/dL — ABNORMAL LOW (ref 3.5–5.0)
Anion gap: 14 (ref 5–15)
BUN: 58 mg/dL — ABNORMAL HIGH (ref 6–20)
CO2: 24 mmol/L (ref 22–32)
Calcium: 8 mg/dL — ABNORMAL LOW (ref 8.9–10.3)
Chloride: 93 mmol/L — ABNORMAL LOW (ref 98–111)
Creatinine, Ser: 4.78 mg/dL — ABNORMAL HIGH (ref 0.61–1.24)
GFR calc Af Amer: 16 mL/min — ABNORMAL LOW (ref >60)
GFR calc non Af Amer: 14 mL/min — ABNORMAL LOW (ref >60)
Glucose, Bld: 112 mg/dL — ABNORMAL HIGH (ref 70–99)
Phosphorus: 4.8 mg/dL — ABNORMAL HIGH (ref 2.5–4.6)
Potassium: 3.7 mmol/L (ref 3.5–5.1)
Sodium: 131 mmol/L — ABNORMAL LOW (ref 135–145)

## 2019-10-06 NOTE — Progress Notes (Signed)
Post HD    10/06/19 1337  Vital Signs  Temp 97.8 F (36.6 C)  Temp Source Oral  Pulse Rate 87  Pulse Rate Source Dinamap  Resp 15  BP (!) 149/108  BP Location Right Arm  BP Method Automatic  Patient Position (if appropriate) Lying  Oxygen Therapy  SpO2 96 %  O2 Device Room Air  Pain Assessment  Pain Scale 0-10  Pain Score 0  Post-Hemodialysis Assessment  Rinseback Volume (mL) 250 mL  KECN 63.4 V  Dialyzer Clearance Lightly streaked  Duration of HD Treatment -hour(s) 3.5 hour(s)  Hemodialysis Intake (mL) 500 mL  UF Total -Machine (mL) 2000 mL  Net UF (mL) 1500 mL  Tolerated HD Treatment Yes  AVG/AVF Arterial Site Held (minutes)  (n/a)  AVG/AVF Venous Site Held (minutes)  (n/a)  Hemodialysis Catheter Right Internal jugular Double lumen Temporary (Non-Tunneled)  No Placement Date or Time found.   Placed prior to admission: Yes  Orientation: Right  Access Location: Internal jugular  Hemodialysis Catheter Type: Double lumen Temporary (Non-Tunneled)  Site Condition No complications  Blue Lumen Status Heparin locked  Red Lumen Status Heparin locked  Purple Lumen Status N/A  Catheter fill solution Heparin 1000 units/ml  Catheter fill volume (Arterial) 2 cc  Catheter fill volume (Venous) 2.2  Dressing Type Biopatch;Occlusive  Dressing Status Clean;Dry;Intact  Drainage Description None  Dressing Change Due 10/09/19  Post treatment catheter status Capped and Clamped

## 2019-10-06 NOTE — Discharge Summary (Addendum)
PROGRESS NOTE    Brett Small  [id number] DOB: [birth year] DOA: 10/03/2019 PCP: Patient, [name]    Brief Narrative:  43 year old male with a history of end-stage renal disease on hemodialysis, chronic systolic congestive heart failure with ejection fraction of less than 20%, admitted to the hospital with shortness of breath.  Initially felt to have possible bronchitis he was started on levofloxacin.  He also underwent dialysis which improved her shortness of breath.  Subsequently the following day, he developed nausea and vomiting after eating and subsequently became hypotensive.  Blood pressure was in the 80s.  Staff reported possible coffee-ground emesis.  Abdomen was noted to be somewhat distended.  NG tube was placed and he was made NPO.  We will continue to cycle CBC to follow hemoglobin.  He has been started on Protonix.   Assessment & Plan:   Active Problems:   End stage renal disease on dialysis (HCC)   Thrombocytopenia (HCC)   Coagulopathy (HCC)   Pleural effusion   Seizure (HCC)   Chronic systolic CHF (congestive heart failure) (HCC)   GERD (gastroesophageal reflux disease)   Secondary hyperparathyroidism (HCC)   Bronchitis   Vomiting   Arm DVT (deep venous thromboembolism), acute, right (HCC)   Hypotension   Hypotension.  Unclear etiology.  Patient did have vomiting with questions of coffee-ground.  Question GI bleeding.  Patient was also prescribed Coreg 25 mg twice daily as well as hydralazine.  He reports that he was not taking these medications at home for quite some time.  These home medications were continued on admission.  Question medication effect leading to hypotension.  He received a small bolus of IV fluids and blood pressures have improved into the 90s.  Coreg and hydralazine have since been held.  Since that time, blood pressures have stabilized.  Continue to monitor closely. Nausea and vomiting, possible GI bleeding.  Staff reported noticing  coffee-ground emesis.  Hemoglobin has had a mild decline from this morning to 8.3 this afternoon.  Follow-up hemoglobins have been stable.  Transfuse as necessary.  He has been started on Protonix twice daily.  He has not had any further episodes of emesis.  GI following.  Probable endoscopy on 12/16.  Tolerating diet without vomiting. Chronic systolic congestive heart failure.  Latest echocardiogram shows ejection fraction of less than 20%.  Will need to monitor volume status closely.  He is chronically on beta-blockers, but this is been held due to hypotension. End-stage renal disease on hemodialysis.  Last dialysis session was 12/15.  Nephrology following Seizure disorder.  Chronically on Keppra.   Acute bronchitis.  Initial concerns for bronchitis on admission.  He was started on levofloxacin.  No signs of wheezing or cough at this time, will hold off on steroids. Pleural effusion.  Patient noted to have left-sided pleural effusion which is a chronic finding.  Suspect is related to CHF.  He does not appear to be particularly short of breath at this time.  Continue to monitor. Recent right upper extremity DVT.  Started on Eliquis during recent admission.  Hold Eliquis for now in light of possible GI bleeding.  Can consider resumption after EGD. Thrombocytopenia.  Appears to be present for the past several weeks/months.  Platelet count, although low appears to be stable at this time.  Continue to monitor. Moderate malnutrition. Nutrition following   DVT prophylaxis: SCDs Code Status: Full code Family Communication: None present Disposition Plan: Discharge home tomorrow if EGD is unremarkable.   Consultants:  Nephrology Gastroenterology  Procedures:    Antimicrobials:  Levofloxacin 12/12.   Subjective: No further nausea and vomiting.  No abdominal pain.  He is having bowel movements.  Bowel movements are not black or bloody.  Denies any shortness of breath.  Objective: Vitals:    10/06/19 1330 10/06/19 1333 10/06/19 1337 10/06/19 1405  BP: (!) 141/91 (!) 145/86 (!) 149/108 (!) 147/109  Pulse: 84 88 87 93  Resp: 17 (!) 22 15 18   Temp:  97.8 F (36.6 C) 97.8 F (36.6 C) 98.6 F (37 C)  TempSrc:  Oral Oral   SpO2:  96% 96% 100%  Weight:      Height:        Intake/Output Summary (Last 24 hours) at 10/06/2019 2015 Last data filed at 10/06/2019 1700 Gross per 24 hour  Intake 960 ml  Output 1500 ml  Net -540 ml   Filed Weights   10/03/19 1145 10/03/19 1630 10/06/19 0945  Weight: 44.5 kg 44.5 kg 46.8 kg    Examination:  General exam: Alert, awake, oriented x 3 Respiratory system: Coarse breath sounds at right base. Respiratory effort normal. Cardiovascular system:RRR. No murmurs, rubs, gallops. Gastrointestinal system: Abdomen is nondistended, soft and nontender. No organomegaly or masses felt. Normal bowel sounds heard. Central nervous system: Alert and oriented. No focal neurological deficits. Extremities: No C/C/E, +pedal pulses Skin: No rashes, lesions or ulcers Psychiatry: Judgement and insight appear normal. Mood & affect appropriate.       Data Reviewed: I have personally reviewed following labs and imaging studies  CBC: Recent Labs  Lab 10/04/19 0615 10/04/19 1543 10/04/19 2309 10/05/19 0543 10/06/19 0633  WBC 10.1 10.3 9.2 8.8 10.8*  HGB 9.4* 8.3* 8.2* 8.3* 8.7*  HCT 29.7* 25.4* 24.5* 25.1* 26.1*  MCV 93.1 90.4 88.1 88.4 87.6  PLT 71* 79* 78* 79* 94*   Basic Metabolic Panel: Recent Labs  Lab 10/03/19 1333 10/04/19 0615 10/05/19 0543 10/06/19 0633  NA 132* 133* 135 131*  K 4.2 3.5 3.5 3.7  CL 90* 95* 95* 93*  CO2 14* 25 26 24   GLUCOSE 104* 111* 93 112*  BUN 42* 30* 52* 58*  CREATININE 4.45* 3.17* 4.25* 4.78*  CALCIUM 9.1 8.5* 8.0* 8.0*  PHOS  --   --  4.3 4.8*   GFR: Estimated Creatinine Clearance: 12.9 mL/min (A) (by C-G formula based on SCr of 4.78 mg/dL (H)). Liver Function Tests: Recent Labs  Lab 10/04/19 0615  10/05/19 0543 10/06/19 0633  AST 28  --   --   ALT 37  --   --   ALKPHOS 98  --   --   BILITOT 3.9*  --   --   PROT 5.7*  --   --   ALBUMIN 2.6* 2.3* 2.4*   No results for input(s): LIPASE, AMYLASE in the last 168 hours. No results for input(s): AMMONIA in the last 168 hours. Coagulation Profile: No results for input(s): INR, PROTIME in the last 168 hours. Cardiac Enzymes: Recent Labs  Lab 10/03/19 1456  CKTOTAL 33*   BNP (last 3 results) No results for input(s): PROBNP in the last 8760 hours. HbA1C: No results for input(s): HGBA1C in the last 72 hours. CBG: Recent Labs  Lab 10/04/19 0837 10/04/19 1146  GLUCAP 113* 165*   Lipid Profile: No results for input(s): CHOL, HDL, LDLCALC, TRIG, CHOLHDL, LDLDIRECT in the last 72 hours. Thyroid Function Tests: No results for input(s): TSH, T4TOTAL, FREET4, T3FREE, THYROIDAB in the last 72 hours. Anemia Panel:  No results for input(s): VITAMINB12, FOLATE, FERRITIN, TIBC, IRON, RETICCTPCT in the last 72 hours. Sepsis Labs: Recent Labs  Lab 10/03/19 2108 10/04/19 0052  LATICACIDVEN 4.5* 2.1*    Recent Results (from the past 240 hour(s))  Respiratory Panel by RT PCR (Flu A&B, Covid) - Nasopharyngeal Swab     Status: None   Collection Time: 10/03/19  1:38 PM   Specimen: Nasopharyngeal Swab  Result Value Ref Range Status   SARS Coronavirus 2 by RT PCR NEGATIVE NEGATIVE Final    Comment: (NOTE) SARS-CoV-2 target nucleic acids are NOT DETECTED. The SARS-CoV-2 RNA is generally detectable in upper respiratoy specimens during the acute phase of infection. The lowest concentration of SARS-CoV-2 viral copies this assay can detect is 131 copies/mL. A negative result does not preclude SARS-Cov-2 infection and should not be used as the sole basis for treatment or other patient management decisions. A negative result may occur with  improper specimen collection/handling, submission of specimen other than nasopharyngeal swab, presence  of viral mutation(s) within the areas targeted by this assay, and inadequate number of viral copies (<131 copies/mL). A negative result must be combined with clinical observations, patient history, and epidemiological information. The expected result is Negative. Fact Sheet for Patients:  PinkCheek.be Fact Sheet for Healthcare Providers:  GravelBags.it This test is not yet ap proved or cleared by the [place] FDA and  has been authorized for detection and/or diagnosis of SARS-CoV-2 by FDA under an Emergency Use Authorization (EUA). This EUA will remain  in effect (meaning this test can be used) for the duration of the COVID-19 declaration under Section 564(b)(1) of the Act, 21 U.S.C. section 360bbb-3(b)(1), unless the authorization is terminated or revoked sooner.    Influenza A by PCR NEGATIVE NEGATIVE Final   Influenza B by PCR NEGATIVE NEGATIVE Final    Comment: (NOTE) The Xpert Xpress SARS-CoV-2/FLU/RSV assay is intended as an aid in  the diagnosis of influenza from Nasopharyngeal swab specimens and  should not be used as a sole basis for treatment. Nasal washings and  aspirates are unacceptable for Xpert Xpress SARS-CoV-2/FLU/RSV  testing. Fact Sheet for Patients: PinkCheek.be Fact Sheet for Healthcare Providers: GravelBags.it This test is not yet approved or cleared by the [place] FDA and  has been authorized for detection and/or diagnosis of SARS-CoV-2 by  FDA under an Emergency Use Authorization (EUA). This EUA will remain  in effect (meaning this test can be used) for the duration of the  Covid-19 declaration under Section 564(b)(1) of the Act, 21  U.S.C. section 360bbb-3(b)(1), unless the authorization is  terminated or revoked. Performed at [facility], [address] [place], [place] [address]          Radiology Studies: No  results found.      Scheduled Meds:  vitamin C  500 mg Oral BID   atorvastatin  80 mg Oral QHS   Chlorhexidine Gluconate Cloth  6 each Topical Q0600   epoetin (EPOGEN/PROCRIT) injection  4,000 Units Intravenous Q T,Th,Sa-HD   feeding supplement (NEPRO CARB STEADY)  237 mL Oral BID BM   gabapentin  100 mg Oral TID   levETIRAcetam  500 mg Oral BID   loratadine  10 mg Oral Daily   multivitamin  1 tablet Oral QHS   pantoprazole (PROTONIX) IV  40 mg Intravenous Q12H   sevelamer carbonate  1,600 mg Oral TID WC   sodium chloride flush  3 mL Intravenous Once   Continuous Infusions:  LOS: 2 days    Time spent: 30 minutes  Kathie Dike, MD [facility]   If 7PM-7AM, please contact night-coverage [email or web address]  10/06/2019, 8:15 PM

## 2019-10-06 NOTE — Progress Notes (Signed)
[facility], [place] 10/06/19  Subjective:  Patient was eating breakfast this a.m. Appears to be tolerating well. He is due for hemodialysis treatment today. Orders have been prepared.   Objective:  Vital signs in last 24 hours:  Temp:  [97.8 F (36.6 C)-98.5 F (36.9 C)] 98.5 F (36.9 C) (12/15 0958) Pulse Rate:  [79-93] 87 (12/15 1115) Resp:  [13-26] 26 (12/15 1115) BP: (107-145)/(78-101) 145/97 (12/15 1115) SpO2:  [92 %-99 %] 92 % (12/15 1100) Weight:  [46.8 kg] 46.8 kg (12/15 0945)  Weight change:  Filed Weights   10/03/19 1145 10/03/19 1630 10/06/19 0945  Weight: 44.5 kg 44.5 kg 46.8 kg    Intake/Output:    Intake/Output Summary (Last 24 hours) at 10/06/2019 1134 Last data filed at 10/06/2019 0900 Gross per 24 hour  Intake 580 ml  Output --  Net 580 ml     Physical Exam: General:  Chronically ill-appearing, laying in the bed  HEENT  anicteric, moist oral mucous membranes  Pulm/lungs  mild crackles at bases  CVS/Heart  S1S2 no rubs  Abdomen:   Soft, nontender  Extremities:  1+ edema left leg  Neurologic:  Alert, able to answer questions  Skin:  No acute rashes  Access:  Right IJ PermCath       Basic Metabolic Panel:  Recent Labs  Lab 10/03/19 1333 10/04/19 0615 10/05/19 0543 10/06/19 0633  NA 132* 133* 135 131*  K 4.2 3.5 3.5 3.7  CL 90* 95* 95* 93*  CO2 14* 25 26 24   GLUCOSE 104* 111* 93 112*  BUN 42* 30* 52* 58*  CREATININE 4.45* 3.17* 4.25* 4.78*  CALCIUM 9.1 8.5* 8.0* 8.0*  PHOS  --   --  4.3 4.8*     CBC: Recent Labs  Lab 10/04/19 0615 10/04/19 1543 10/04/19 2309 10/05/19 0543 10/06/19 0633  WBC 10.1 10.3 9.2 8.8 10.8*  HGB 9.4* 8.3* 8.2* 8.3* 8.7*  HCT 29.7* 25.4* 24.5* 25.1* 26.1*  MCV 93.1 90.4 88.1 88.4 87.6  PLT 71* 79* 78* 79* 94*      Lab Results  Component Value Date   HEPBSAG NON REACTIVE 10/03/2019   HEPBSAB Non Reactive 12/25/2018   HEPBIGM NON REACTIVE 10/03/2019       Microbiology:  Recent Results (from the past 240 hour(s))  Respiratory Panel by RT PCR (Flu A&B, Covid) - Nasopharyngeal Swab     Status: None   Collection Time: 10/03/19  1:38 PM   Specimen: Nasopharyngeal Swab  Result Value Ref Range Status   SARS Coronavirus 2 by RT PCR NEGATIVE NEGATIVE Final    Comment: (NOTE) SARS-CoV-2 target nucleic acids are NOT DETECTED. The SARS-CoV-2 RNA is generally detectable in upper respiratoy specimens during the acute phase of infection. The lowest concentration of SARS-CoV-2 viral copies this assay can detect is 131 copies/mL. A negative result does not preclude SARS-Cov-2 infection and should not be used as the sole basis for treatment or other patient management decisions. A negative result may occur with  improper specimen collection/handling, submission of specimen other than nasopharyngeal swab, presence of viral mutation(s) within the areas targeted by this assay, and inadequate number of viral copies (<131 copies/mL). A negative result must be combined with clinical observations, patient history, and epidemiological information. The expected result is Negative. Fact Sheet for Patients:  PinkCheek.be Fact Sheet for Healthcare Providers:  GravelBags.it This test is not yet ap proved or cleared by the [place] FDA and  has been authorized for detection  and/or diagnosis of SARS-CoV-2 by FDA under an Emergency Use Authorization (EUA). This EUA will remain  in effect (meaning this test can be used) for the duration of the COVID-19 declaration under Section 564(b)(1) of the Act, 21 U.S.C. section 360bbb-3(b)(1), unless the authorization is terminated or revoked sooner.    Influenza A by PCR NEGATIVE NEGATIVE Final   Influenza B by PCR NEGATIVE NEGATIVE Final    Comment: (NOTE) The Xpert Xpress SARS-CoV-2/FLU/RSV assay is intended as an aid in  the diagnosis of influenza  from Nasopharyngeal swab specimens and  should not be used as a sole basis for treatment. Nasal washings and  aspirates are unacceptable for Xpert Xpress SARS-CoV-2/FLU/RSV  testing. Fact Sheet for Patients: PinkCheek.be Fact Sheet for Healthcare Providers: GravelBags.it This test is not yet approved or cleared by the [place] FDA and  has been authorized for detection and/or diagnosis of SARS-CoV-2 by  FDA under an Emergency Use Authorization (EUA). This EUA will remain  in effect (meaning this test can be used) for the duration of the  Covid-19 declaration under Section 564(b)(1) of the Act, 21  U.S.C. section 360bbb-3(b)(1), unless the authorization is  terminated or revoked. Performed at [facility], [address] [place], [place] [address]     Coagulation Studies: No results for input(s): LABPROT, INR in the last 72 hours.  Urinalysis: No results for input(s): COLORURINE, LABSPEC, PHURINE, GLUCOSEU, HGBUR, BILIRUBINUR, KETONESUR, PROTEINUR, UROBILINOGEN, NITRITE, LEUKOCYTESUR in the last 72 hours.  Invalid input(s): APPERANCEUR    Imaging: DG Abd 1 View  Result Date: 10/04/2019 CLINICAL DATA:  NG tube placement EXAM: ABDOMEN - 1 VIEW COMPARISON:  10/04/2019 FINDINGS: Interval placement of esophagogastric tube, tip near the gastroesophageal junction and side port above the diaphragm. Recommend advancement to ensure subdiaphragmatic positioning of tip and side port. Nonobstructive pattern of bowel gas in the included abdomen. No obvious free air. Stent projects over the central abdomen. Cardiomegaly. IMPRESSION: Interval placement of esophagogastric tube, tip near the gastroesophageal junction and side port above the diaphragm. Recommend advancement to ensure subdiaphragmatic positioning of tip and side port. Electronically Signed   By: Eddie Candle M.D.   On: 10/04/2019 17:10   DG Abd 2 Views  Result  Date: 10/04/2019 CLINICAL DATA:  Vomiting. EXAM: ABDOMEN - 2 VIEW COMPARISON:  08/25/2019 FINDINGS: There is no bowel dilation to suggest obstruction. No air-fluid levels or free air. Vascular stent overlies the upper to mid lumbar spine. A right common iliac stent is present. These are stable from prior study. There are surgical vascular clips in the central abdomen and left lower quadrant. These are also unchanged. No evidence of renal or ureteral stones. There is opacity at the left lung base, incompletely imaged. Skeletal structures unremarkable. IMPRESSION: 1. No evidence of bowel obstruction or free air.  No acute findings. Electronically Signed   By: Lajean Manes M.D.   On: 10/04/2019 16:11     Medications:    . vitamin C  500 mg Oral BID  . atorvastatin  80 mg Oral QHS  . Chlorhexidine Gluconate Cloth  6 each Topical Q0600  . epoetin (EPOGEN/PROCRIT) injection  4,000 Units Intravenous Q T,Th,Sa-HD  . feeding supplement (NEPRO CARB STEADY)  237 mL Oral BID BM  . gabapentin  100 mg Oral TID  . levETIRAcetam  500 mg Oral BID  . loratadine  10 mg Oral Daily  . multivitamin  1 tablet Oral QHS  . pantoprazole (PROTONIX) IV  40 mg Intravenous Q12H  .  sevelamer carbonate  1,600 mg Oral TID WC  . sodium chloride flush  3 mL Intravenous Once   acetaminophen **OR** acetaminophen, guaiFENesin-dextromethorphan, hydrOXYzine, neomycin-bacitracin-polymyxin, ondansetron **OR** ondansetron (ZOFRAN) IV, phenol  Assessment/ Plan:  43 y.o. male with end-stage renal disease on hemodialysis, hypertension, peripheral vascular disease, depression, history of PEA event October 2020, current smoker  [facility] [address]/TTS/9 IJ catheter  1.  Cough/congestion, shortness of breath -Appears to be improving.  Question aspiration event.  2. End-stage renal disease -Patient due for hemodialysis treatment today.  Orders have been prepared..   3. Anemia of chronic kidney disease Lab Results  Component  Value Date   HGB 8.7 (L) 10/06/2019  -Maintain the patient on Epogen 4000 units IV with dialysis treatments.  4.  Secondary hyperparathyroidism.  Check serum phosphorus today and otherwise maintain the patient on Renvela 2 tablets p.o. 3 times daily with meals.     LOS: 2 Jenniffer Vessels 12/15/202011:34 AM  [place], [place]  Note: This note was prepared with Dragon dictation. Any transcription errors are unintentional

## 2019-10-06 NOTE — Progress Notes (Signed)
Pre HD    10/06/19 0945  Vital Signs  Temp 98.5 F (36.9 C)  Temp Source Oral  Pulse Rate 93  Pulse Rate Source Dinamap  Resp 16  BP (!) 131/98  BP Location Right Arm  BP Method Automatic  Patient Position (if appropriate) Lying  Oxygen Therapy  SpO2 99 %  O2 Device Room Air  Pain Assessment  Pain Scale 0-10  Pain Score 0  Dialysis Weight  Weight 46.8 kg  Type of Weight Pre-Dialysis  Time-Out for Hemodialysis  What Procedure? HD  Pt Identifiers(min of two) First/Last Name;MRN/Account#;Pt's DOB(use if MRN/Acct# not available  Correct Site? Yes  Correct Side? Yes  Correct Procedure? Yes  Consents Verified? Yes  Rad Studies Available? N/A  Safety Precautions Reviewed? Yes  Engineer, civil (consulting) Number [id number]  Station Number 4  UF/Alarm Test Passed  Conductivity: Meter 14  Conductivity: Machine  13.8  pH 7.2  Reverse Osmosis main  Normal Saline Lot Number [id number]  Dialyzer Lot Number [id number]  Disposable Set Lot Number [id number]  Machine Temperature 98.6 F (37 C)  Musician and Audible Yes  Blood Lines Intact and Secured Yes  Pre Treatment Patient Checks  Vascular access used during treatment Catheter  HD catheter dressing before treatment WDL  Patient is receiving dialysis in a chair  (no)  Hepatitis B Surface Antigen Results Negative  Date Hepatitis B Surface Antigen Drawn 10/03/19  Isolation Initiated  (no)  Hepatitis B Surface Antibody  (<10)  Date Hepatitis B Surface Antibody Drawn 10/03/19  Hemodialysis Consent Verified Yes  Hemodialysis Standing Orders Initiated Yes  ECG (Telemetry) Monitor On Yes  Prime Ordered Normal Saline  Length of  DialysisTreatment -hour(s) 3.5 Hour(s)  Dialysis Treatment Comments  (Na 140)  Dialyzer Elisio 17H NR  Dialysate 2K;2.5 Ca  Dialysate Flow Ordered 800  Blood Flow Rate Ordered 400 mL/min  Ultrafiltration Goal 1.5 Liters  Dialysis Blood Pressure Support Ordered Normal Saline  Education / Care Plan   Dialysis Education Provided Yes  Documented Education in Care Plan Yes  Outpatient Plan of Care Reviewed and on Chart Yes  Hemodialysis Catheter Right Internal jugular Double lumen Temporary (Non-Tunneled)  No Placement Date or Time found.   Placed prior to admission: Yes  Orientation: Right  Access Location: Internal jugular  Hemodialysis Catheter Type: Double lumen Temporary (Non-Tunneled)  Site Condition No complications  Blue Lumen Status Blood return noted  Red Lumen Status Blood return noted  Purple Lumen Status N/A  Dressing Type Gauze/Drain sponge  Dressing Status Intact  Interventions New dressing

## 2019-10-06 NOTE — Progress Notes (Signed)
Post Hd Assessment   10/06/19 1348  Neurological  Level of Consciousness Alert  Orientation Level Oriented X4  Respiratory  Respiratory Pattern Regular;Unlabored  Cardiac  Pulse Regular  Heart Sounds S1, S2  Cardiac Rhythm NSR  Vascular  R Radial Pulse +2  L Radial Pulse +2  Musculoskeletal  Musculoskeletal (WDL) X  Psychosocial  Psychosocial (WDL) WDL

## 2019-10-06 NOTE — Progress Notes (Signed)
Pre HD Assessment   10/06/19 0946  Neurological  Level of Consciousness Alert  Orientation Level Oriented X4  Respiratory  Respiratory Pattern Regular;Unlabored  Chest Assessment Chest expansion symmetrical  Cardiac  Pulse Regular  Heart Sounds S1, S2  Cardiac Rhythm NSR  Vascular  R Radial Pulse +2  L Radial Pulse +2  Musculoskeletal  Musculoskeletal (WDL) X  Psychosocial  Psychosocial (WDL) WDL

## 2019-10-06 NOTE — Progress Notes (Signed)
HD Initiated   10/06/19 0958  Vital Signs  Temp 98.5 F (36.9 C)  Temp Source Oral  Pulse Rate 90  Pulse Rate Source Dinamap  Resp 13  BP (!) 121/101  BP Location Right Arm  BP Method Automatic  Patient Position (if appropriate) Lying  Oxygen Therapy  SpO2 99 %  O2 Device Room Air  Pain Assessment  Pain Scale 0-10  Pain Score 0  During Hemodialysis Assessment  Blood Flow Rate (mL/min) 400 mL/min  Arterial Pressure (mmHg) -130 mmHg  Venous Pressure (mmHg) 120 mmHg  Transmembrane Pressure (mmHg) 80 mmHg  Ultrafiltration Rate (mL/min) 570 mL/min  Dialysate Flow Rate (mL/min) 500 ml/min  Conductivity: Machine  13.8  HD Safety Checks Performed Yes  Dialysis Fluid Bolus Normal Saline  Bolus Amount (mL) 250 mL  Intra-Hemodialysis Comments Tx initiated

## 2019-10-06 NOTE — Progress Notes (Signed)
HD Completed   10/06/19 1333  Vital Signs  Temp 97.8 F (36.6 C)  Temp Source Oral  Pulse Rate 88  Pulse Rate Source Dinamap  Resp (!) 22  BP (!) 145/86  BP Location Right Arm  BP Method Automatic  Patient Position (if appropriate) Lying  Oxygen Therapy  SpO2 96 %  O2 Device Room Air  Pain Assessment  Pain Scale 0-10  Pain Score 0  During Hemodialysis Assessment  HD Safety Checks Performed Yes  Dialysis Fluid Bolus Normal Saline  Bolus Amount (mL) 250 mL  Intra-Hemodialysis Comments Tx completed;Tolerated well

## 2019-10-07 ENCOUNTER — Inpatient Hospital Stay: Payer: Medicare Other | Admitting: Anesthesiology

## 2019-10-07 ENCOUNTER — Encounter: Payer: Self-pay | Admitting: Internal Medicine

## 2019-10-07 ENCOUNTER — Encounter
Admission: EM | Disposition: A | Discharge status: Home Health | Payer: Self-pay | Source: Home / Self Care | Attending: Internal Medicine

## 2019-10-07 DIAGNOSIS — K2210 Ulcer of esophagus without bleeding: Secondary | ICD-10-CM

## 2019-10-07 DIAGNOSIS — K3189 Other diseases of stomach and duodenum: Secondary | ICD-10-CM

## 2019-10-07 DIAGNOSIS — J40 Bronchitis, not specified as acute or chronic: Secondary | ICD-10-CM

## 2019-10-07 DIAGNOSIS — K2100 Gastro-esophageal reflux disease with esophagitis, without bleeding: Secondary | ICD-10-CM

## 2019-10-07 HISTORY — PX: ESOPHAGOGASTRODUODENOSCOPY: SHX5428

## 2019-10-07 LAB — CBC
HCT: 28.3 % — ABNORMAL LOW (ref 39.0–52.0)
Hemoglobin: 8.8 g/dL — ABNORMAL LOW (ref 13.0–17.0)
MCH: 28.9 pg (ref 26.0–34.0)
MCHC: 31.1 g/dL (ref 30.0–36.0)
MCV: 92.8 fL (ref 80.0–100.0)
Platelets: 143 10*3/uL — ABNORMAL LOW (ref 150–400)
RBC: 3.05 MIL/uL — ABNORMAL LOW (ref 4.22–5.81)
RDW: 18.7 % — ABNORMAL HIGH (ref 11.5–15.5)
WBC: 10 10*3/uL (ref 4.0–10.5)
nRBC: 0 % (ref 0.0–0.2)

## 2019-10-07 LAB — BASIC METABOLIC PANEL
Anion gap: 13 (ref 5–15)
BUN: 28 mg/dL — ABNORMAL HIGH (ref 6–20)
CO2: 24 mmol/L (ref 22–32)
Calcium: 8.1 mg/dL — ABNORMAL LOW (ref 8.9–10.3)
Chloride: 97 mmol/L — ABNORMAL LOW (ref 98–111)
Creatinine, Ser: 2.79 mg/dL — ABNORMAL HIGH (ref 0.61–1.24)
GFR calc Af Amer: 30 mL/min — ABNORMAL LOW (ref >60)
GFR calc non Af Amer: 26 mL/min — ABNORMAL LOW (ref >60)
Glucose, Bld: 112 mg/dL — ABNORMAL HIGH (ref 70–99)
Potassium: 3.2 mmol/L — ABNORMAL LOW (ref 3.5–5.1)
Sodium: 134 mmol/L — ABNORMAL LOW (ref 135–145)

## 2019-10-07 SURGERY — EGD (ESOPHAGOGASTRODUODENOSCOPY)
Anesthesia: General

## 2019-10-07 MED ORDER — POTASSIUM CHLORIDE CRYS ER 20 MEQ PO TBCR
20.0000 meq | EXTENDED_RELEASE_TABLET | Freq: Once | ORAL | Status: AC
  Administered 2019-10-07: 18:00:00 20 meq via ORAL
  Filled 2019-10-07: qty 1

## 2019-10-07 MED ORDER — PROPOFOL 500 MG/50ML IV EMUL
INTRAVENOUS | Status: DC | PRN
  Administered 2019-10-07: 100 ug/kg/min via INTRAVENOUS

## 2019-10-07 MED ORDER — POTASSIUM CHLORIDE CRYS ER 20 MEQ PO TBCR: 40.0000 meq | EXTENDED_RELEASE_TABLET | Freq: Once | ORAL | Status: DC

## 2019-10-07 MED ORDER — LIDOCAINE 2% (20 MG/ML) 5 ML SYRINGE
INTRAMUSCULAR | Status: DC | PRN
  Administered 2019-10-07: 100 mg via INTRAVENOUS

## 2019-10-07 MED ORDER — MIDAZOLAM HCL 5 MG/5ML IJ SOLN
INTRAMUSCULAR | Status: DC | PRN
  Administered 2019-10-07: 1 mg via INTRAVENOUS

## 2019-10-07 MED ORDER — MIDAZOLAM HCL 2 MG/2ML IJ SOLN
INTRAMUSCULAR | Status: AC
  Filled 2019-10-07: qty 2

## 2019-10-07 MED ORDER — IPRATROPIUM-ALBUTEROL 0.5-2.5 (3) MG/3ML IN SOLN: 3.0000 mL | RESPIRATORY_TRACT | Status: DC | PRN

## 2019-10-07 MED ORDER — APIXABAN 2.5 MG PO TABS
2.5000 mg | ORAL_TABLET | Freq: Two times a day (BID) | ORAL | Status: DC
  Administered 2019-10-08 – 2019-10-09 (×3): 2.5 mg via ORAL
  Filled 2019-10-07 (×3): qty 1

## 2019-10-07 MED ORDER — SODIUM CHLORIDE 0.9 % IV SOLN: INTRAVENOUS | Status: DC

## 2019-10-07 MED ORDER — FENTANYL CITRATE (PF) 100 MCG/2ML IJ SOLN
INTRAMUSCULAR | Status: AC
  Filled 2019-10-07: qty 2

## 2019-10-07 MED ORDER — HYDRALAZINE HCL 20 MG/ML IJ SOLN: 10.0000 mg | Freq: Four times a day (QID) | INTRAMUSCULAR | Status: DC | PRN

## 2019-10-07 MED ORDER — BENZONATATE 100 MG PO CAPS
200.0000 mg | ORAL_CAPSULE | Freq: Three times a day (TID) | ORAL | Status: DC
  Administered 2019-10-07 – 2019-10-08 (×3): 200 mg via ORAL
  Filled 2019-10-07 (×3): qty 2

## 2019-10-07 MED ORDER — FENTANYL CITRATE (PF) 100 MCG/2ML IJ SOLN
INTRAMUSCULAR | Status: DC | PRN
  Administered 2019-10-07: 25 ug via INTRAVENOUS

## 2019-10-07 NOTE — Progress Notes (Signed)
PROGRESS NOTE    Brett Small  [id number] DOB: Jul 20, 1976 DOA: 10/03/2019 PCP: Patient, [name]    Brief Narrative:  43 year old male with a history of end-stage renal disease on hemodialysis, chronic systolic congestive heart failure with ejection fraction of less than 20%, admitted to the hospital with shortness of breath.  Initially felt to have possible bronchitis he was started on levofloxacin.  He also underwent dialysis which improved her shortness of breath.  Subsequently the following day, he developed nausea and vomiting after eating and subsequently became hypotensive.  Blood pressure was in the 80s.  Staff reported possible coffee-ground emesis.  Abdomen was noted to be somewhat distended.  NG tube was placed and he was made NPO.  We will continue to cycle CBC to follow hemoglobin.  He has been started on Protonix.  12/16: NPO for EGD today   Assessment & Plan:   Active Problems:   End stage renal disease on dialysis (HCC)   Thrombocytopenia (HCC)   Coagulopathy (HCC)   Pleural effusion   Seizure (HCC)   Chronic systolic CHF (congestive heart failure) (HCC)   GERD (gastroesophageal reflux disease)   Secondary hyperparathyroidism (HCC)   Bronchitis   Vomiting   Arm DVT (deep venous thromboembolism), acute, right (HCC)   Hypotension   1. Hypotension.  Unclear etiology.  Patient did have vomiting with questions of coffee-ground.  Question GI bleeding.  Patient was also prescribed Coreg 25 mg twice daily as well as hydralazine.  He reports that he was not taking these medications at home for quite some time.  These home medications were continued on admission.  Question medication effect leading to hypotension.  He received a small bolus of IV fluids and blood pressures have improved into the 90s.  Coreg and hydralazine have since been held.  Since that time, blood pressures have stabilized.  Continue to monitor closely.  Can slowly restart BP medications  tomorrow  2. Nausea and vomiting, possible GI bleeding.  Staff reported noticing coffee-ground emesis. EGD today.  Severe erosive esophagitis noted.  Per GI recommendations continue PPI twice daily indefinitely.  Can restart liquid diet and home Eliquis.  3. Chronic systolic congestive heart failure.  Latest echocardiogram shows ejection fraction of less than 20%.  Will need to monitor volume status closely.  He is chronically on beta-blockers, but this is been held due to hypotension.  4. End-stage renal disease on hemodialysis.  Last dialysis session was 12/15.  Nephrology following  5. Seizure disorder.  Chronically on Keppra.    6. Acute bronchitis.  Initial concerns for bronchitis on admission.  He was started on levofloxacin.  No signs of wheezing or cough at this time, will hold off on steroids.  7. Pleural effusion.  Patient noted to have left-sided pleural effusion which is a chronic finding.  Suspect is related to CHF.  He does not appear to be particularly short of breath at this time.  Continue to monitor.  8. Recent right upper extremity DVT.  Started on Eliquis during recent admission.  Can resume now post EGD  9. Thrombocytopenia.  Appears to be present for the past several weeks/months.  Platelet count, although low appears to be stable at this time.  Continue to monitor.   DVT prophylaxis: SCDs Code Status: Full code Family Communication: None present Disposition Plan: Discharge home tomorrow if EGD is unremarkable.   Consultants:   Nephrology  Gastroenterology  Procedures:   EGD (10/07/19)  Antimicrobials:   Levofloxacin 12/12.  Subjective: Seen and examined N.p.o. for procedure No new complaints  Objective: Vitals:   10/07/19 1524 10/07/19 1534 10/07/19 1544 10/07/19 1554  BP: (!) 125/100 (!) 125/99 (!) 144/100 132/84  Pulse: 96 97 (!) 104 100  Resp: 19 (!) 21 19 15   Temp:      TempSrc:      SpO2: 93% 97% 98% 90%  Weight:      Height:         Intake/Output Summary (Last 24 hours) at 10/07/2019 1834 Last data filed at 10/06/2019 2300 Gross per 24 hour  Intake 240 ml  Output --  Net 240 ml   Filed Weights   10/03/19 1630 10/06/19 0945 10/07/19 1346  Weight: 44.5 kg 46.8 kg 46.8 kg    Examination:  General exam: Alert, awake, oriented x 3 Respiratory system: Coarse breath sounds at right base. Respiratory effort normal. Cardiovascular system:RRR. No murmurs, rubs, gallops. Gastrointestinal system: Abdomen is nondistended, soft and nontender. No organomegaly or masses felt. Normal bowel sounds heard. Central nervous system: Alert and oriented. No focal neurological deficits. Extremities: No C/C/E, +pedal pulses Skin: No rashes, lesions or ulcers Psychiatry: Judgement and insight appear normal. Mood & affect appropriate.       Data Reviewed: I have personally reviewed following labs and imaging studies  CBC: Recent Labs  Lab 10/04/19 1543 10/04/19 2309 10/05/19 0543 10/06/19 0633 10/07/19 0607  WBC 10.3 9.2 8.8 10.8* 10.0  HGB 8.3* 8.2* 8.3* 8.7* 8.8*  HCT 25.4* 24.5* 25.1* 26.1* 28.3*  MCV 90.4 88.1 88.4 87.6 92.8  PLT 79* 78* 79* 94* [id number]*   Basic Metabolic Panel: Recent Labs  Lab 10/03/19 1333 10/04/19 0615 10/05/19 0543 10/06/19 0633 10/07/19 0607  NA 132* 133* 135 131* 134*  K 4.2 3.5 3.5 3.7 3.2*  CL 90* 95* 95* 93* 97*  CO2 14* 25 26 24 24   GLUCOSE 104* 111* 93 112* 112*  BUN 42* 30* 52* 58* 28*  CREATININE 4.45* 3.17* 4.25* 4.78* 2.79*  CALCIUM 9.1 8.5* 8.0* 8.0* 8.1*  PHOS  --   --  4.3 4.8*  --    GFR: Estimated Creatinine Clearance: 22.1 mL/min (A) (by C-G formula based on SCr of 2.79 mg/dL (H)). Liver Function Tests: Recent Labs  Lab 10/04/19 0615 10/05/19 0543 10/06/19 0633  AST 28  --   --   ALT 37  --   --   ALKPHOS 98  --   --   BILITOT 3.9*  --   --   PROT 5.7*  --   --   ALBUMIN 2.6* 2.3* 2.4*   No results for input(s): LIPASE, AMYLASE in the last 168 hours. No  results for input(s): AMMONIA in the last 168 hours. Coagulation Profile: No results for input(s): INR, PROTIME in the last 168 hours. Cardiac Enzymes: Recent Labs  Lab 10/03/19 1456  CKTOTAL 33*   BNP (last 3 results) No results for input(s): PROBNP in the last 8760 hours. HbA1C: No results for input(s): HGBA1C in the last 72 hours. CBG: Recent Labs  Lab 10/04/19 0837 10/04/19 1146  GLUCAP 113* 165*   Lipid Profile: No results for input(s): CHOL, HDL, LDLCALC, TRIG, CHOLHDL, LDLDIRECT in the last 72 hours. Thyroid Function Tests: No results for input(s): TSH, T4TOTAL, FREET4, T3FREE, THYROIDAB in the last 72 hours. Anemia Panel: No results for input(s): VITAMINB12, FOLATE, FERRITIN, TIBC, IRON, RETICCTPCT in the last 72 hours. Sepsis Labs: Recent Labs  Lab 10/03/19 2108 10/04/19 0052  LATICACIDVEN 4.5* 2.1*  Recent Results (from the past 240 hour(s))  Respiratory Panel by RT PCR (Flu A&B, Covid) - Nasopharyngeal Swab     Status: None   Collection Time: 10/03/19  1:38 PM   Specimen: Nasopharyngeal Swab  Result Value Ref Range Status   SARS Coronavirus 2 by RT PCR NEGATIVE NEGATIVE Final    Comment: (NOTE) SARS-CoV-2 target nucleic acids are NOT DETECTED. The SARS-CoV-2 RNA is generally detectable in upper respiratoy specimens during the acute phase of infection. The lowest concentration of SARS-CoV-2 viral copies this assay can detect is 131 copies/mL. A negative result does not preclude SARS-Cov-2 infection and should not be used as the sole basis for treatment or other patient management decisions. A negative result may occur with  improper specimen collection/handling, submission of specimen other than nasopharyngeal swab, presence of viral mutation(s) within the areas targeted by this assay, and inadequate number of viral copies (<131 copies/mL). A negative result must be combined with clinical observations, patient history, and epidemiological information.  The expected result is Negative. Fact Sheet for Patients:  PinkCheek.be Fact Sheet for Healthcare Providers:  GravelBags.it This test is not yet ap proved or cleared by the [place] FDA and  has been authorized for detection and/or diagnosis of SARS-CoV-2 by FDA under an Emergency Use Authorization (EUA). This EUA will remain  in effect (meaning this test can be used) for the duration of the COVID-19 declaration under Section 564(b)(1) of the Act, 21 U.S.C. section 360bbb-3(b)(1), unless the authorization is terminated or revoked sooner.    Influenza A by PCR NEGATIVE NEGATIVE Final   Influenza B by PCR NEGATIVE NEGATIVE Final    Comment: (NOTE) The Xpert Xpress SARS-CoV-2/FLU/RSV assay is intended as an aid in  the diagnosis of influenza from Nasopharyngeal swab specimens and  should not be used as a sole basis for treatment. Nasal washings and  aspirates are unacceptable for Xpert Xpress SARS-CoV-2/FLU/RSV  testing. Fact Sheet for Patients: PinkCheek.be Fact Sheet for Healthcare Providers: GravelBags.it This test is not yet approved or cleared by the [place] FDA and  has been authorized for detection and/or diagnosis of SARS-CoV-2 by  FDA under an Emergency Use Authorization (EUA). This EUA will remain  in effect (meaning this test can be used) for the duration of the  Covid-19 declaration under Section 564(b)(1) of the Act, 21  U.S.C. section 360bbb-3(b)(1), unless the authorization is  terminated or revoked. Performed at [facility], [address], [place], [place] [address]          Radiology Studies: No results found.      Scheduled Meds: . vitamin C  500 mg Oral BID  . atorvastatin  80 mg Oral QHS  . benzonatate  200 mg Oral TID  . Chlorhexidine Gluconate Cloth  6 each Topical Q0600  . epoetin (EPOGEN/PROCRIT) injection   4,000 Units Intravenous Q T,Th,Sa-HD  . feeding supplement (NEPRO CARB STEADY)  237 mL Oral BID BM  . gabapentin  100 mg Oral TID  . levETIRAcetam  500 mg Oral BID  . loratadine  10 mg Oral Daily  . multivitamin  1 tablet Oral QHS  . pantoprazole (PROTONIX) IV  40 mg Intravenous Q12H  . sevelamer carbonate  1,600 mg Oral TID WC  . sodium chloride flush  3 mL Intravenous Once   Continuous Infusions:    LOS: 3 days    Time spent: 30 minutes  Sidney Ace, MD [facility]   If 7PM-7AM, please contact night-coverage [email or web address]  10/07/2019, 6:34 PM

## 2019-10-07 NOTE — Anesthesia Post-op Follow-up Note (Signed)
Anesthesia QCDR form completed.        

## 2019-10-07 NOTE — Anesthesia Preprocedure Evaluation (Signed)
Anesthesia Evaluation  Patient identified by MRN, date of birth, ID band Patient awake    Reviewed: Allergy & Precautions, H&P , NPO status , Patient's Chart, lab work & pertinent test results, reviewed documented beta blocker date and time   History of Anesthesia Complications Negative for: history of anesthetic complications  Airway Mallampati: III  TM Distance: >3 FB Neck ROM: full    Dental  (+) Upper Dentures, Lower Dentures   Pulmonary neg shortness of breath, neg COPD, neg recent URI, Current Smoker,    Pulmonary exam normal        Cardiovascular Exercise Tolerance: Good hypertension, (-) angina+ Past MI, + Peripheral Vascular Disease and +CHF  (-) Cardiac Stents Normal cardiovascular exam(-) dysrhythmias (-) Valvular Problems/Murmurs     Neuro/Psych Seizures -,  PSYCHIATRIC DISORDERS Depression    GI/Hepatic GERD  ,(+) Hepatitis -  Endo/Other  negative endocrine ROS  Renal/GU ESRF and DialysisRenal disease  negative genitourinary   Musculoskeletal   Abdominal   Peds  Hematology  (+) Blood dyscrasia, anemia ,   Anesthesia Other Findings Past Medical History: No date: ESRD (end stage renal disease) (HCC) No date: Hypertension No date: PAD (peripheral artery disease) (HCC)     Comment:  Required aortofemoral stent-which had closed and had to               redo the procedure and ischemia of limb. No date: Peripheral vascular disease (HCC) No date: Renal disorder No date: Secondary hyperparathyroidism of renal origin ([place])   Reproductive/Obstetrics negative OB ROS                             Anesthesia Physical  Anesthesia Plan  ASA: IV  Anesthesia Plan: General   Post-op Pain Management:    Induction: Intravenous  PONV Risk Score and Plan: 1 and Propofol infusion and TIVA  Airway Management Planned: Natural Airway and Nasal Cannula  Additional Equipment:   Intra-op  Plan:   Post-operative Plan: Extubation in OR  Informed Consent: I have reviewed the patients History and Physical, chart, labs and discussed the procedure including the risks, benefits and alternatives for the proposed anesthesia with the patient or authorized representative who has indicated his/her understanding and acceptance.     Dental Advisory Given  Plan Discussed with: Anesthesiologist, CRNA and Surgeon  Anesthesia Plan Comments:         Anesthesia Quick Evaluation

## 2019-10-07 NOTE — Op Note (Signed)
[facility] Gastroenterology Patient Name: Brett Small Procedure Date: 10/07/2019 2:41 PM MRN: [id number] Account #: [id number] Date of Birth: 24-Jan-1976 Admit Type: Inpatient Age: 43 Room: [facility] ENDO ROOM 3 Gender: Male Note Status: Finalized Procedure:             Upper GI endoscopy Indications:           Coffee-ground emesis Providers:             Lin Landsman MD, MD Referring MD:          No Local Md, MD (Referring MD) Medicines:             Monitored Anesthesia Care Complications:         No immediate complications. Estimated blood loss: None. Procedure:             Pre-Anesthesia Assessment:                        - Prior to the procedure, a History and Physical was                         performed, and patient medications and allergies were                         reviewed. The patient is competent. The risks and                         benefits of the procedure and the sedation options and                         risks were discussed with the patient. All questions                         were answered and informed consent was obtained.                         Patient identification and proposed procedure were                         verified by the physician, the nurse, the                         anesthesiologist, the anesthetist and the technician                         in the pre-procedure area in the procedure room in the                         endoscopy suite. Mental Status Examination: alert and                         oriented. Airway Examination: normal oropharyngeal                         airway and neck mobility. Respiratory Examination:                         clear to auscultation. CV Examination: normal.  Prophylactic Antibiotics: The patient does not require                         prophylactic antibiotics. Prior Anticoagulants: The                         patient has taken Eliquis (apixaban), last dose was  4                         days prior to procedure. ASA Grade Assessment: III - A                         patient with severe systemic disease. After reviewing                         the risks and benefits, the patient was deemed in                         satisfactory condition to undergo the procedure. The                         anesthesia plan was to use monitored anesthesia care                         (MAC). Immediately prior to administration of                         medications, the patient was re-assessed for adequacy                         to receive sedatives. The heart rate, respiratory                         rate, oxygen saturations, blood pressure, adequacy of                         pulmonary ventilation, and response to care were                         monitored throughout the procedure. The physical                         status of the patient was re-assessed after the                         procedure.                        After obtaining informed consent, the endoscope was                         passed under direct vision. Throughout the procedure,                         the patient's blood pressure, pulse, and oxygen                         saturations were monitored continuously. The Endoscope  was introduced through the mouth, and advanced to the                         second part of duodenum. The upper GI endoscopy was                         accomplished without difficulty. The patient tolerated                         the procedure well. Findings:      The duodenal bulb and second portion of the duodenum were normal.      Two dispersed 10 mm linear ulcerated erosions with no bleeding and no       stigmata of recent bleeding were found on the greater curvature of the       gastric body close to cardia.      LA Grade D (one or more mucosal breaks involving at least 75% of       esophageal circumference) esophagitis with no bleeding  was found in the       lower third of the esophagus.      Few superficial esophageal ulcers with no bleeding and no stigmata of       recent bleeding were found in the lower third of the esophagus. Impression:            - Normal duodenal bulb and second portion of the                         duodenum.                        - Erosive gastropathy with no bleeding and no stigmata                         of recent bleeding.                        - LA Grade D reflux esophagitis with no bleeding.                        - Esophageal ulcers with no bleeding and no stigmata                         of recent bleeding.                        - No specimens collected. Recommendation:        - Return patient to hospital ward for ongoing care.                        - Resume previous diet today.                        - Continue present medications.                        - Follow an antireflux regimen.                        - Use Prilosec (omeprazole) 40 mg PO BID indefinitely.                        -  Return to GI clinic in 1 month. Procedure Code(s):     --- Professional ---                        [phone], Esophagogastroduodenoscopy, flexible,                         transoral; diagnostic, including collection of                         specimen(s) by brushing or washing, when performed                         (separate procedure) Diagnosis Code(s):     --- Professional ---                        K31.89, Other diseases of stomach and duodenum                        K21.00, Gastro-esophageal reflux disease with                         esophagitis, without bleeding                        K22.10, Ulcer of esophagus without bleeding                        K92.0, Hematemesis CPT copyright 2019 American Medical Association. All rights reserved. The codes documented in this report are preliminary and upon coder review may  be revised to meet current compliance requirements. Dr. Ulyess Mort Lin Landsman MD, MD 10/07/2019 3:08:53 PM This report has been signed electronically. Number of Addenda: 0 Note Initiated On: 10/07/2019 2:41 PM Estimated Blood Loss:  Estimated blood loss: none.      [facility]

## 2019-10-07 NOTE — Transfer of Care (Signed)
Immediate Anesthesia Transfer of Care Note  Patient: Brett Small  Procedure(s) Performed: ESOPHAGOGASTRODUODENOSCOPY (EGD) (N/A )  Patient Location: PACU  Anesthesia Type:General  Level of Consciousness: sedated  Airway & Oxygen Therapy: Patient Spontanous Breathing and Patient connected to nasal cannula oxygen  Post-op Assessment: Report given to RN and Post -op Vital signs reviewed and stable  Post vital signs: Reviewed and stable  Last Vitals:  Vitals Value Taken Time  BP 118/89 10/07/19 1506  Temp 35.9 C 10/07/19 1506  Pulse 100 10/07/19 1506  Resp 20 10/07/19 1506  SpO2 93 % 10/07/19 1506    Last Pain:  Vitals:   10/07/19 1506  TempSrc:   PainSc: 0-No pain      Patients Stated Pain Goal: 2 [id number]  Complications: No apparent anesthesia complications

## 2019-10-07 NOTE — Progress Notes (Signed)
[place], [place] 10/07/19  Subjective:  Patient seen and evaluated at bedside. Underwent dialysis yesterday. Tolerated well. Due for EGD today.   Objective:  Vital signs in last 24 hours:  Temp:  [97.8 F (36.6 C)-98.6 F (37 C)] 98 F (36.7 C) (12/16 0417) Pulse Rate:  [84-100] 100 (12/16 0417) Resp:  [13-26] 18 (12/16 0417) BP: (120-153)/(84-109) 145/109 (12/16 0417) SpO2:  [92 %-100 %] 96 % (12/16 0417)  Weight change:  Filed Weights   10/03/19 1145 10/03/19 1630 10/06/19 0945  Weight: 44.5 kg 44.5 kg 46.8 kg    Intake/Output:    Intake/Output Summary (Last 24 hours) at 10/07/2019 0955 Last data filed at 10/06/2019 2300 Gross per 24 hour  Intake 720 ml  Output 1500 ml  Net -780 ml     Physical Exam: General:  Chronically ill-appearing, laying in the bed  HEENT  anicteric, moist oral mucous membranes  Pulm/lungs  mild crackles at bases  CVS/Heart  S1S2 no rubs  Abdomen:   Soft, nontender  Extremities:  No lower extremity edema.  Neurologic:  Alert, able to answer questions  Skin:  No acute rashes  Access:  Right IJ PermCath       Basic Metabolic Panel:  Recent Labs  Lab 10/03/19 1333 10/04/19 0615 10/05/19 0543 10/06/19 0633 10/07/19 0607  NA 132* 133* 135 131* 134*  K 4.2 3.5 3.5 3.7 3.2*  CL 90* 95* 95* 93* 97*  CO2 14* 25 26 24 24   GLUCOSE 104* 111* 93 112* 112*  BUN 42* 30* 52* 58* 28*  CREATININE 4.45* 3.17* 4.25* 4.78* 2.79*  CALCIUM 9.1 8.5* 8.0* 8.0* 8.1*  PHOS  --   --  4.3 4.8*  --      CBC: Recent Labs  Lab 10/04/19 1543 10/04/19 2309 10/05/19 0543 10/06/19 0633 10/07/19 0607  WBC 10.3 9.2 8.8 10.8* 10.0  HGB 8.3* 8.2* 8.3* 8.7* 8.8*  HCT 25.4* 24.5* 25.1* 26.1* 28.3*  MCV 90.4 88.1 88.4 87.6 92.8  PLT 79* 78* 79* 94* 143*      Lab Results  Component Value Date   HEPBSAG NON REACTIVE 10/03/2019   HEPBSAB Non Reactive 12/25/2018   HEPBIGM NON REACTIVE 10/03/2019       Microbiology:  Recent Results (from the past 240 hour(s))  Respiratory Panel by RT PCR (Flu A&B, Covid) - Nasopharyngeal Swab     Status: None   Collection Time: 10/03/19  1:38 PM   Specimen: Nasopharyngeal Swab  Result Value Ref Range Status   SARS Coronavirus 2 by RT PCR NEGATIVE NEGATIVE Final    Comment: (NOTE) SARS-CoV-2 target nucleic acids are NOT DETECTED. The SARS-CoV-2 RNA is generally detectable in upper respiratoy specimens during the acute phase of infection. The lowest concentration of SARS-CoV-2 viral copies this assay can detect is 131 copies/mL. A negative result does not preclude SARS-Cov-2 infection and should not be used as the sole basis for treatment or other patient management decisions. A negative result may occur with  improper specimen collection/handling, submission of specimen other than nasopharyngeal swab, presence of viral mutation(s) within the areas targeted by this assay, and inadequate number of viral copies (<131 copies/mL). A negative result must be combined with clinical observations, patient history, and epidemiological information. The expected result is Negative. Fact Sheet for Patients:  PinkCheek.be Fact Sheet for Healthcare Providers:  GravelBags.it This test is not yet ap proved or cleared by the [place] FDA and  has been authorized for detection and/or  diagnosis of SARS-CoV-2 by FDA under an Emergency Use Authorization (EUA). This EUA will remain  in effect (meaning this test can be used) for the duration of the COVID-19 declaration under Section 564(b)(1) of the Act, 21 U.S.C. section 360bbb-3(b)(1), unless the authorization is terminated or revoked sooner.    Influenza A by PCR NEGATIVE NEGATIVE Final   Influenza B by PCR NEGATIVE NEGATIVE Final    Comment: (NOTE) The Xpert Xpress SARS-CoV-2/FLU/RSV assay is intended as an aid in  the diagnosis of influenza  from Nasopharyngeal swab specimens and  should not be used as a sole basis for treatment. Nasal washings and  aspirates are unacceptable for Xpert Xpress SARS-CoV-2/FLU/RSV  testing. Fact Sheet for Patients: PinkCheek.be Fact Sheet for Healthcare Providers: GravelBags.it This test is not yet approved or cleared by the [place] FDA and  has been authorized for detection and/or diagnosis of SARS-CoV-2 by  FDA under an Emergency Use Authorization (EUA). This EUA will remain  in effect (meaning this test can be used) for the duration of the  Covid-19 declaration under Section 564(b)(1) of the Act, 21  U.S.C. section 360bbb-3(b)(1), unless the authorization is  terminated or revoked. Performed at [facility], [address] [place], [place] [address]     Coagulation Studies: No results for input(s): LABPROT, INR in the last 72 hours.  Urinalysis: No results for input(s): COLORURINE, LABSPEC, PHURINE, GLUCOSEU, HGBUR, BILIRUBINUR, KETONESUR, PROTEINUR, UROBILINOGEN, NITRITE, LEUKOCYTESUR in the last 72 hours.  Invalid input(s): APPERANCEUR    Imaging: No results found.   Medications:    . vitamin C  500 mg Oral BID  . atorvastatin  80 mg Oral QHS  . Chlorhexidine Gluconate Cloth  6 each Topical Q0600  . epoetin (EPOGEN/PROCRIT) injection  4,000 Units Intravenous Q T,Th,Sa-HD  . feeding supplement (NEPRO CARB STEADY)  237 mL Oral BID BM  . gabapentin  100 mg Oral TID  . levETIRAcetam  500 mg Oral BID  . loratadine  10 mg Oral Daily  . multivitamin  1 tablet Oral QHS  . pantoprazole (PROTONIX) IV  40 mg Intravenous Q12H  . sevelamer carbonate  1,600 mg Oral TID WC  . sodium chloride flush  3 mL Intravenous Once   acetaminophen **OR** acetaminophen, guaiFENesin-dextromethorphan, hydrALAZINE, hydrOXYzine, neomycin-bacitracin-polymyxin, ondansetron **OR** ondansetron (ZOFRAN) IV, phenol  Assessment/  Plan:  43 y.o. male with end-stage renal disease on hemodialysis, hypertension, peripheral vascular disease, depression, history of PEA event October 2020, current smoker  [facility] [address]/TTS/9 IJ catheter  1.  Cough/congestion, shortness of breath -Appears to have improved as compared to admission.  Continue to monitor respiratory status.  2. End-stage renal disease -Patient completed dialysis treatment yesterday.  Tolerated well.  No urgent indication for dialysis today.  We will plan for dialysis again tomorrow.   3. Anemia of chronic kidney disease Lab Results  Component Value Date   HGB 8.8 (L) 10/07/2019  -Hemoglobin slightly below target at 8.8.  We do plan to maintain the patient on Epogen 4000 units with dialysis treatments.  4.  Secondary hyperparathyroidism.  Continue Renvela 2 tablets p.o. 3 times daily with meals.  Phosphorus acceptable at 4.8.     LOS: 3 Brett Small 12/16/20209:55 AM  [place], [place]  Note: This note was prepared with Dragon dictation. Any transcription errors are unintentional

## 2019-10-08 ENCOUNTER — Encounter: Payer: Self-pay | Admitting: *Deleted

## 2019-10-08 LAB — CBC WITH DIFFERENTIAL/PLATELET
Abs Immature Granulocytes: 0.06 10*3/uL (ref 0.00–0.07)
Basophils Absolute: 0.1 10*3/uL (ref 0.0–0.1)
Basophils Relative: 0 %
Eosinophils Absolute: 0 10*3/uL (ref 0.0–0.5)
Eosinophils Relative: 0 %
HCT: 29 % — ABNORMAL LOW (ref 39.0–52.0)
Hemoglobin: 9.2 g/dL — ABNORMAL LOW (ref 13.0–17.0)
Immature Granulocytes: 1 %
Lymphocytes Relative: 10 %
Lymphs Abs: 1.2 10*3/uL (ref 0.7–4.0)
MCH: 29.1 pg (ref 26.0–34.0)
MCHC: 31.7 g/dL (ref 30.0–36.0)
MCV: 91.8 fL (ref 80.0–100.0)
Monocytes Absolute: 1 10*3/uL (ref 0.1–1.0)
Monocytes Relative: 8 %
Neutro Abs: 9.6 10*3/uL — ABNORMAL HIGH (ref 1.7–7.7)
Neutrophils Relative %: 81 %
Platelets: 182 10*3/uL (ref 150–400)
RBC: 3.16 MIL/uL — ABNORMAL LOW (ref 4.22–5.81)
RDW: 18.6 % — ABNORMAL HIGH (ref 11.5–15.5)
WBC: 11.9 10*3/uL — ABNORMAL HIGH (ref 4.0–10.5)
nRBC: 0 % (ref 0.0–0.2)

## 2019-10-08 LAB — BASIC METABOLIC PANEL
Anion gap: 16 — ABNORMAL HIGH (ref 5–15)
BUN: 44 mg/dL — ABNORMAL HIGH (ref 6–20)
CO2: 19 mmol/L — ABNORMAL LOW (ref 22–32)
Calcium: 8.5 mg/dL — ABNORMAL LOW (ref 8.9–10.3)
Chloride: 98 mmol/L (ref 98–111)
Creatinine, Ser: 4.28 mg/dL — ABNORMAL HIGH (ref 0.61–1.24)
GFR calc Af Amer: 18 mL/min — ABNORMAL LOW (ref >60)
GFR calc non Af Amer: 16 mL/min — ABNORMAL LOW (ref >60)
Glucose, Bld: 118 mg/dL — ABNORMAL HIGH (ref 70–99)
Potassium: 4.6 mmol/L (ref 3.5–5.1)
Sodium: 133 mmol/L — ABNORMAL LOW (ref 135–145)

## 2019-10-08 MED ORDER — GUAIFENESIN ER 600 MG PO TB12
600.0000 mg | ORAL_TABLET | Freq: Two times a day (BID) | ORAL | Status: DC
  Administered 2019-10-08 – 2019-10-09 (×2): 600 mg via ORAL
  Filled 2019-10-08 (×2): qty 1

## 2019-10-08 MED ORDER — GABAPENTIN 100 MG PO CAPS
100.0000 mg | ORAL_CAPSULE | Freq: Two times a day (BID) | ORAL | Status: DC
  Administered 2019-10-08 – 2019-10-09 (×2): 100 mg via ORAL
  Filled 2019-10-08 (×2): qty 1

## 2019-10-08 MED ORDER — PANTOPRAZOLE SODIUM 40 MG PO TBEC
40.0000 mg | DELAYED_RELEASE_TABLET | Freq: Two times a day (BID) | ORAL | Status: DC
  Administered 2019-10-08 – 2019-10-09 (×3): 40 mg via ORAL
  Filled 2019-10-08 (×3): qty 1

## 2019-10-08 MED ORDER — CARVEDILOL 12.5 MG PO TABS
12.5000 mg | ORAL_TABLET | Freq: Two times a day (BID) | ORAL | Status: DC
  Administered 2019-10-08 – 2019-10-09 (×2): 12.5 mg via ORAL
  Filled 2019-10-08 (×2): qty 1

## 2019-10-08 NOTE — Progress Notes (Signed)
PROGRESS NOTE    Brett Small  [id number] DOB: 03/10/1976 DOA: 10/03/2019 PCP: Patient, [name]    Brief Narrative:  43 year old male with a history of end-stage renal disease on hemodialysis, chronic systolic congestive heart failure with ejection fraction of less than 20%, admitted to the hospital with shortness of breath.  Initially felt to have possible bronchitis he was started on levofloxacin.  He also underwent dialysis which improved her shortness of breath.  Subsequently the following day, he developed nausea and vomiting after eating and subsequently became hypotensive.  Blood pressure was in the 80s.  Staff reported possible coffee-ground emesis.  Abdomen was noted to be somewhat distended.  NG tube was placed and he was made NPO.  We will continue to cycle CBC to follow hemoglobin.  He has been started on Protonix.  12/16: NPO for EGD today  12/17: Complaining IBD.  Severe esophagitis.  GI recommendations twice daily PPI.  Patient continues to endorse cough and chest congestion.  States that one of the medications we are giving him is making him shake.  Assessment & Plan:   Active Problems:   End stage renal disease on dialysis (HCC)   Thrombocytopenia (HCC)   Coagulopathy (HCC)   Pleural effusion   Seizure (HCC)   Chronic systolic CHF (congestive heart failure) (HCC)   GERD (gastroesophageal reflux disease)   Secondary hyperparathyroidism (HCC)   Bronchitis   Vomiting   Arm DVT (deep venous thromboembolism), acute, right (HCC)   Hypotension   1. Hypotension.  Unclear etiology.  Patient did have vomiting with questions of coffee-ground.  Question GI bleeding.  Patient was also prescribed Coreg 25 mg twice daily as well as hydralazine.  He reports that he was not taking these medications at home for quite some time.  These home medications were continued on admission.  Question medication effect leading to hypotension.  He received a small bolus of IV fluids and  blood pressures have improved into the 90s.  Coreg and hydralazine have since been held.  Since that time, blood pressures have stabilized.  Continue to monitor closely.  Will restart Coreg at half dose (12.5mg  BID) today.  2. Nausea and vomiting, possible GI bleeding.  Staff reported noticing coffee-ground emesis. EGD 12/16.  Severe erosive esophagitis noted.  Per GI recommendations continue PPI twice daily indefinitely.  Advance diet to regular as tolerated  3. Chronic systolic congestive heart failure.  Latest echocardiogram shows ejection fraction of less than 20%.  Will need to monitor volume status closely.  He is chronically on beta-blockers, but this is been held due to hypotension.  Restarted beta blockers 12/17  4. End-stage renal disease on hemodialysis.   Nephrology following.  Tolerating HD without issue  5. Seizure disorder.  Chronically on Keppra.    6. Acute bronchitis.  Initial concerns for bronchitis on admission.  All abx stopped at this time.  7. Pleural effusion.  Patient noted to have left-sided pleural effusion which is a chronic finding.  Suspect is related to CHF.  He does not appear to be particularly short of breath at this time.  Continue to monitor.  8. Recent right upper extremity DVT.  Started on Eliquis during recent admission. Resume at home dose  9. Thrombocytopenia.  Appears to be present for the past several weeks/months.  Platelet count, although low appears to be stable at this time.  Continue to monitor.   DVT prophylaxis: SCDs Code Status: Full code Family Communication: None present Disposition Plan: Discharge  home tomorrow if able to wean from oxygen   Consultants:   Nephrology  Gastroenterology  Procedures:   EGD (10/07/19)  Antimicrobials:   Levofloxacin 12/12.   Subjective: Seen and examined Continues to endorse cough and chest tightness Remains on 2 L nasal cannula  Objective: Vitals:   10/07/19 1554 10/07/19 2006 10/07/19  2213 10/08/19 0441  BP: 132/84 (!) 145/99  (!) 127/97  Pulse: 100 (!) 113 (!) 113 (!) 107  Resp: 15 20  20   Temp:  (!) 97.5 F (36.4 C)  97.6 F (36.4 C)  TempSrc:      SpO2: 90% 100% 100% 98%  Weight:      Height:        Intake/Output Summary (Last 24 hours) at 10/08/2019 1442 Last data filed at 10/08/2019 1043 Gross per 24 hour  Intake 360 ml  Output --  Net 360 ml   Filed Weights   10/03/19 1630 10/06/19 0945 10/07/19 1346  Weight: 44.5 kg 46.8 kg 46.8 kg    Examination:  General exam: Alert, awake, oriented x 3 Respiratory system: Coarse breath sounds at right base. Respiratory effort normal. Cardiovascular system:RRR. No murmurs, rubs, gallops. Gastrointestinal system: Abdomen is nondistended, soft and nontender. No organomegaly or masses felt. Normal bowel sounds heard. Central nervous system: Alert and oriented. No focal neurological deficits. Extremities: No C/C/E, +pedal pulses Skin: No rashes, lesions or ulcers Psychiatry: Judgement and insight appear normal. Mood & affect appropriate.       Data Reviewed: I have personally reviewed following labs and imaging studies  CBC: Recent Labs  Lab 10/04/19 2309 10/05/19 0543 10/06/19 0633 10/07/19 0607 10/08/19 0817  WBC 9.2 8.8 10.8* 10.0 11.9*  NEUTROABS  --   --   --   --  9.6*  HGB 8.2* 8.3* 8.7* 8.8* 9.2*  HCT 24.5* 25.1* 26.1* 28.3* 29.0*  MCV 88.1 88.4 87.6 92.8 91.8  PLT 78* 79* 94* 143* [id number]   Basic Metabolic Panel: Recent Labs  Lab 10/04/19 0615 10/05/19 0543 10/06/19 0633 10/07/19 0607 10/08/19 0817  NA 133* 135 131* 134* 133*  K 3.5 3.5 3.7 3.2* 4.6  CL 95* 95* 93* 97* 98  CO2 25 26 24 24  19*  GLUCOSE 111* 93 112* 112* 118*  BUN 30* 52* 58* 28* 44*  CREATININE 3.17* 4.25* 4.78* 2.79* 4.28*  CALCIUM 8.5* 8.0* 8.0* 8.1* 8.5*  PHOS  --  4.3 4.8*  --   --    GFR: Estimated Creatinine Clearance: 14.4 mL/min (A) (by C-G formula based on SCr of 4.28 mg/dL (H)). Liver Function  Tests: Recent Labs  Lab 10/04/19 0615 10/05/19 0543 10/06/19 0633  AST 28  --   --   ALT 37  --   --   ALKPHOS 98  --   --   BILITOT 3.9*  --   --   PROT 5.7*  --   --   ALBUMIN 2.6* 2.3* 2.4*   No results for input(s): LIPASE, AMYLASE in the last 168 hours. No results for input(s): AMMONIA in the last 168 hours. Coagulation Profile: No results for input(s): INR, PROTIME in the last 168 hours. Cardiac Enzymes: Recent Labs  Lab 10/03/19 1456  CKTOTAL 33*   BNP (last 3 results) No results for input(s): PROBNP in the last 8760 hours. HbA1C: No results for input(s): HGBA1C in the last 72 hours. CBG: Recent Labs  Lab 10/04/19 0837 10/04/19 1146  GLUCAP 113* 165*   Lipid Profile: No results for input(s): CHOL,  HDL, LDLCALC, TRIG, CHOLHDL, LDLDIRECT in the last 72 hours. Thyroid Function Tests: No results for input(s): TSH, T4TOTAL, FREET4, T3FREE, THYROIDAB in the last 72 hours. Anemia Panel: No results for input(s): VITAMINB12, FOLATE, FERRITIN, TIBC, IRON, RETICCTPCT in the last 72 hours. Sepsis Labs: Recent Labs  Lab 10/03/19 2108 10/04/19 0052  LATICACIDVEN 4.5* 2.1*    Recent Results (from the past 240 hour(s))  Respiratory Panel by RT PCR (Flu A&B, Covid) - Nasopharyngeal Swab     Status: None   Collection Time: 10/03/19  1:38 PM   Specimen: Nasopharyngeal Swab  Result Value Ref Range Status   SARS Coronavirus 2 by RT PCR NEGATIVE NEGATIVE Final    Comment: (NOTE) SARS-CoV-2 target nucleic acids are NOT DETECTED. The SARS-CoV-2 RNA is generally detectable in upper respiratoy specimens during the acute phase of infection. The lowest concentration of SARS-CoV-2 viral copies this assay can detect is 131 copies/mL. A negative result does not preclude SARS-Cov-2 infection and should not be used as the sole basis for treatment or other patient management decisions. A negative result may occur with  improper specimen collection/handling, submission of specimen  other than nasopharyngeal swab, presence of viral mutation(s) within the areas targeted by this assay, and inadequate number of viral copies (<131 copies/mL). A negative result must be combined with clinical observations, patient history, and epidemiological information. The expected result is Negative. Fact Sheet for Patients:  PinkCheek.be Fact Sheet for Healthcare Providers:  GravelBags.it This test is not yet ap proved or cleared by the [place] FDA and  has been authorized for detection and/or diagnosis of SARS-CoV-2 by FDA under an Emergency Use Authorization (EUA). This EUA will remain  in effect (meaning this test can be used) for the duration of the COVID-19 declaration under Section 564(b)(1) of the Act, 21 U.S.C. section 360bbb-3(b)(1), unless the authorization is terminated or revoked sooner.    Influenza A by PCR NEGATIVE NEGATIVE Final   Influenza B by PCR NEGATIVE NEGATIVE Final    Comment: (NOTE) The Xpert Xpress SARS-CoV-2/FLU/RSV assay is intended as an aid in  the diagnosis of influenza from Nasopharyngeal swab specimens and  should not be used as a sole basis for treatment. Nasal washings and  aspirates are unacceptable for Xpert Xpress SARS-CoV-2/FLU/RSV  testing. Fact Sheet for Patients: PinkCheek.be Fact Sheet for Healthcare Providers: GravelBags.it This test is not yet approved or cleared by the [place] FDA and  has been authorized for detection and/or diagnosis of SARS-CoV-2 by  FDA under an Emergency Use Authorization (EUA). This EUA will remain  in effect (meaning this test can be used) for the duration of the  Covid-19 declaration under Section 564(b)(1) of the Act, 21  U.S.C. section 360bbb-3(b)(1), unless the authorization is  terminated or revoked. Performed at [facility], [address] [place], [place]  [address]          Radiology Studies: No results found.      Scheduled Meds: . apixaban  2.5 mg Oral BID  . vitamin C  500 mg Oral BID  . atorvastatin  80 mg Oral QHS  . Chlorhexidine Gluconate Cloth  6 each Topical Q0600  . epoetin (EPOGEN/PROCRIT) injection  4,000 Units Intravenous Q T,Th,Sa-HD  . feeding supplement (NEPRO CARB STEADY)  237 mL Oral BID BM  . gabapentin  100 mg Oral BID  . guaiFENesin  600 mg Oral BID  . levETIRAcetam  500 mg Oral BID  . loratadine  10 mg Oral Daily  .  multivitamin  1 tablet Oral QHS  . pantoprazole  40 mg Oral BID  . sevelamer carbonate  1,600 mg Oral TID WC  . sodium chloride flush  3 mL Intravenous Once   Continuous Infusions:    LOS: 4 days    Time spent: 30 minutes  Sidney Ace, MD [facility]   If 7PM-7AM, please contact night-coverage [email or web address]  10/08/2019, 2:42 PM

## 2019-10-08 NOTE — Progress Notes (Signed)
Patient wanted robitussin but it was too close from last admin time. I mesaged Dr. Priscella Mann Who ordered to go ahead and give the robitussin now. The patient also stated that something he was taking was making him shake, Dr. Priscella Mann said that it may be the robitussin or most likely the nebs. I gave the patient his regular scheduled medications

## 2019-10-08 NOTE — Anesthesia Postprocedure Evaluation (Signed)
Anesthesia Post Note  Patient: Brett Small  Procedure(s) Performed: ESOPHAGOGASTRODUODENOSCOPY (EGD) (N/A )  Patient location during evaluation: PACU Anesthesia Type: General Level of consciousness: awake and alert Pain management: pain level controlled Vital Signs Assessment: post-procedure vital signs reviewed and stable Respiratory status: spontaneous breathing, nonlabored ventilation and respiratory function stable Cardiovascular status: blood pressure returned to baseline and stable Postop Assessment: no apparent nausea or vomiting Anesthetic complications: no                Durenda Hurt

## 2019-10-08 NOTE — Progress Notes (Signed)
[place], [place] 10/08/19  Subjective:  Patient due for hemodialysis today. Resting comfortably at the moment.    Objective:  Vital signs in last 24 hours:  Temp:  [96.4 F (35.8 C)-97.6 F (36.4 C)] 97.6 F (36.4 C) (12/17 0441) Pulse Rate:  [96-113] 107 (12/17 0441) Resp:  [15-21] 20 (12/17 0441) BP: (118-145)/(84-113) 127/97 (12/17 0441) SpO2:  [90 %-100 %] 98 % (12/17 0441) Weight:  [46.8 kg] 46.8 kg (12/16 1346)  Weight change: 0 kg Filed Weights   10/03/19 1630 10/06/19 0945 10/07/19 1346  Weight: 44.5 kg 46.8 kg 46.8 kg    Intake/Output:    Intake/Output Summary (Last 24 hours) at 10/08/2019 1110 Last data filed at 10/08/2019 1043 Gross per 24 hour  Intake 360 ml  Output --  Net 360 ml     Physical Exam: General:  Chronically ill-appearing, laying in the bed  HEENT  anicteric, moist oral mucous membranes  Pulm/lungs  basilar rales, normal effort  CVS/Heart  S1S2 no rubs  Abdomen:   Soft, nontender  Extremities:  No lower extremity edema.  Neurologic:  Alert, able to answer questions  Skin:  No acute rashes  Access:  Right IJ PermCath       Basic Metabolic Panel:  Recent Labs  Lab 10/04/19 0615 10/05/19 0543 10/06/19 0633 10/07/19 0607 10/08/19 0817  NA 133* 135 131* 134* 133*  K 3.5 3.5 3.7 3.2* 4.6  CL 95* 95* 93* 97* 98  CO2 25 26 24 24  19*  GLUCOSE 111* 93 112* 112* 118*  BUN 30* 52* 58* 28* 44*  CREATININE 3.17* 4.25* 4.78* 2.79* 4.28*  CALCIUM 8.5* 8.0* 8.0* 8.1* 8.5*  PHOS  --  4.3 4.8*  --   --      CBC: Recent Labs  Lab 10/04/19 2309 10/05/19 0543 10/06/19 0633 10/07/19 0607 10/08/19 0817  WBC 9.2 8.8 10.8* 10.0 11.9*  NEUTROABS  --   --   --   --  9.6*  HGB 8.2* 8.3* 8.7* 8.8* 9.2*  HCT 24.5* 25.1* 26.1* 28.3* 29.0*  MCV 88.1 88.4 87.6 92.8 91.8  PLT 78* 79* 94* 143* 182      Lab Results  Component Value Date   HEPBSAG NON REACTIVE 10/03/2019   HEPBSAB Non Reactive 12/25/2018    HEPBIGM NON REACTIVE 10/03/2019      Microbiology:  Recent Results (from the past 240 hour(s))  Respiratory Panel by RT PCR (Flu A&B, Covid) - Nasopharyngeal Swab     Status: None   Collection Time: 10/03/19  1:38 PM   Specimen: Nasopharyngeal Swab  Result Value Ref Range Status   SARS Coronavirus 2 by RT PCR NEGATIVE NEGATIVE Final    Comment: (NOTE) SARS-CoV-2 target nucleic acids are NOT DETECTED. The SARS-CoV-2 RNA is generally detectable in upper respiratoy specimens during the acute phase of infection. The lowest concentration of SARS-CoV-2 viral copies this assay can detect is 131 copies/mL. A negative result does not preclude SARS-Cov-2 infection and should not be used as the sole basis for treatment or other patient management decisions. A negative result may occur with  improper specimen collection/handling, submission of specimen other than nasopharyngeal swab, presence of viral mutation(s) within the areas targeted by this assay, and inadequate number of viral copies (<131 copies/mL). A negative result must be combined with clinical observations, patient history, and epidemiological information. The expected result is Negative. Fact Sheet for Patients:  PinkCheek.be Fact Sheet for Healthcare Providers:  GravelBags.it This test is  not yet ap proved or cleared by the [place] and  has been authorized for detection and/or diagnosis of SARS-CoV-2 by FDA under an Emergency Use Authorization (EUA). This EUA will remain  in effect (meaning this test can be used) for the duration of the COVID-19 declaration under Section 564(b)(1) of the Act, 21 U.S.C. section 360bbb-3(b)(1), unless the authorization is terminated or revoked sooner.    Influenza A by PCR NEGATIVE NEGATIVE Final   Influenza B by PCR NEGATIVE NEGATIVE Final    Comment: (NOTE) The Xpert Xpress SARS-CoV-2/FLU/RSV assay is intended as an aid  in  the diagnosis of influenza from Nasopharyngeal swab specimens and  should not be used as a sole basis for treatment. Nasal washings and  aspirates are unacceptable for Xpert Xpress SARS-CoV-2/FLU/RSV  testing. Fact Sheet for Patients: PinkCheek.be Fact Sheet for Healthcare Providers: GravelBags.it This test is not yet approved or cleared by the [place] FDA and  has been authorized for detection and/or diagnosis of SARS-CoV-2 by  FDA under an Emergency Use Authorization (EUA). This EUA will remain  in effect (meaning this test can be used) for the duration of the  Covid-19 declaration under Section 564(b)(1) of the Act, 21  U.S.C. section 360bbb-3(b)(1), unless the authorization is  terminated or revoked. Performed at [facility], [address] [place], [place] [address]     Coagulation Studies: No results for input(s): LABPROT, INR in the last 72 hours.  Urinalysis: No results for input(s): COLORURINE, LABSPEC, PHURINE, GLUCOSEU, HGBUR, BILIRUBINUR, KETONESUR, PROTEINUR, UROBILINOGEN, NITRITE, LEUKOCYTESUR in the last 72 hours.  Invalid input(s): APPERANCEUR    Imaging: No results found.   Medications:    . apixaban  2.5 mg Oral BID  . vitamin C  500 mg Oral BID  . atorvastatin  80 mg Oral QHS  . Chlorhexidine Gluconate Cloth  6 each Topical Q0600  . epoetin (EPOGEN/PROCRIT) injection  4,000 Units Intravenous Q T,Th,Sa-HD  . feeding supplement (NEPRO CARB STEADY)  237 mL Oral BID BM  . gabapentin  100 mg Oral BID  . guaiFENesin  600 mg Oral BID  . levETIRAcetam  500 mg Oral BID  . loratadine  10 mg Oral Daily  . multivitamin  1 tablet Oral QHS  . pantoprazole  40 mg Oral BID  . sevelamer carbonate  1,600 mg Oral TID WC  . sodium chloride flush  3 mL Intravenous Once   acetaminophen **OR** acetaminophen, guaiFENesin-dextromethorphan, hydrOXYzine, ipratropium-albuterol,  neomycin-bacitracin-polymyxin, ondansetron **OR** ondansetron (ZOFRAN) IV, phenol  Assessment/ Plan:  43 y.o. male with end-stage renal disease on hemodialysis, hypertension, peripheral vascular disease, depression, history of PEA event October 2020, current smoker  [facility] [address]/TTS/9 IJ catheter  1.  Cough/congestion, shortness of breath -Patient now breathing comfortably.  His symptoms have improved as compared to when he was first admitted.  2. End-stage renal disease -Patient due for hemodialysis treatment today.  Orders have been prepared.  Ultrafiltration target 1.5 kg.   3. Anemia of chronic kidney disease Lab Results  Component Value Date   HGB 9.2 (L) 10/08/2019  -Hemoglobin up to 9.2.  Maintain the patient on Epogen with dialysis treatments..  4.  Secondary hyperparathyroidism.  Follow-up serum phosphorus today and maintain the patient on Renvela otherwise.     LOS: 4 Coralie Stanke 12/17/202011:10 AM  [place], [place]  Note: This note was prepared with Dragon dictation. Any transcription errors are unintentional

## 2019-10-08 NOTE — Care Management Important Message (Signed)
Important Message  Patient Details  Name: Brett Small MRN: [id number] Date of Birth: April 04, 1976   Medicare Important Message Given:  Yes     Dannette Barbara 10/08/2019, 1:14 PM

## 2019-10-08 NOTE — Progress Notes (Signed)
   10/08/19 1650  Neurological  Level of Consciousness Alert  Orientation Level Oriented X4  Respiratory  Respiratory Pattern Regular;Unlabored  Bilateral Breath Sounds Diminished;Clear  Cardiac  Pulse Regular  Heart Sounds S1, S2  ECG Monitor Yes  STABLE FOR HD TX CVCWDL UFG 1.5L

## 2019-10-08 NOTE — TOC Initial Note (Signed)
Transition of Care [facility]) - Initial/Assessment Note    Patient Details  Name: Brett Small MRN: [id number] Date of Birth: 07-11-1976  [facility] CM/SW Contact:    B[place]ly Sessions, RN Phone Number: 10/08/2019, 5:01 PM  Clinical Narrative:                  Patient admitted with bronchitis Patient resides at a boarding house  PCP Ross transportation for HD.  Friend named Brett Small from the boarding house can provide transportation if needed  Patient denies issues with obtaining medications  Patient has platform walker in the home  Patient currently requiring acute O2  RNCM has reached out to [place] with [facility] to determine if patient is still open  Expected Discharge Plan: [place]     Patient Goals and CMS Choice        Expected Discharge Plan and Services Expected Discharge Plan: [place] arrangements for the past 2 months: [place]                                      Prior Living Arrangements/Services Living arrangements for the past 2 months: [place] you feel safe going back to the place where you live?: Yes          Current home services: Other (comment)    Activities of Daily Living Home Assistive Devices/Equipment: None ADL Screening (condition at time of admission) Patient's cognitive ability adequate to safely complete daily activities?: Yes Is the patient deaf or have difficulty hearing?: No Does the patient have difficulty seeing, even when wearing glasses/contacts?: No Does the patient have difficulty concentrating, remembering, or making decisions?: No Patient able to express need for assistance with ADLs?: Yes Does the patient have difficulty dressing or bathing?: No Independently performs ADLs?: Yes (appropriate for developmental age) Does the patient have difficulty walking or climbing stairs?: No Weakness of Legs:  None Weakness of Arms/Hands: None  Permission Sought/Granted                  Emotional Assessment Appearance:: Appears older than stated age     Orientation: : Oriented to Self, Oriented to Place, Oriented to  Time, Oriented to Situation   Psych Involvement: No (comment)  Admission diagnosis:  Bronchitis [J40] ESRD (end stage renal disease) ([facility]) [N18.6] Volume overload state of heart [E87.79] Vomiting [R11.10] Patient Active Problem List   Diagnosis Date Noted  . Vomiting 10/04/2019  . Arm DVT (deep venous thromboembolism), acute, right ([place]) 10/04/2019  . Hypotension 10/04/2019  . Bronchitis 10/03/2019  . Tobacco abuse 08/24/2019  . Marijuana use 08/24/2019  . PVD (peripheral vascular disease) ([place]) 08/24/2019  . Wound dehiscence, surgical, sequela 08/24/2019  . Chronic systolic CHF (congestive heart failure) ([place]) 08/24/2019  . Volume overload 08/24/2019  . Hyperkalemia 08/24/2019  . Complications due to renal dialysis device, implant, and graft 08/24/2019  . Seizure ([place])   . Malnutrition of moderate degree 08/05/2019  . Acute CHF (congestive heart failure) ([place]) 08/04/2019  . Pulmonary edema 07/27/2019  . Pleural effusion 07/20/2019  . NSTEMI (non-ST elevated myocardial infarction) ([place]) 07/09/2019  . Pneumonia 07/02/2019  . Acute liver failure 05/19/2019  . Hematemesis 05/19/2019  . Hepatitis   . Thrombocytopenia ([place])   . Coagulopathy ([place])   .  Sepsis ([place]) 02/28/2019  . Lobar pneumonia ([place]) 02/28/2019  . Acute respiratory failure ([place]) 02/04/2019  . Acute respiratory failure with hypoxemia ([place]) 01/27/2019  . Depression 01/07/2019  . MDD (major depressive disorder), single episode, severe , no psychosis ([place])   . Homelessness   . Acute respiratory failure with hypoxia ([place]) 12/25/2018  . End stage renal disease on dialysis ([place]) 12/25/2018  . Hypertension 12/25/2018  . Renal osteodystrophy 12/25/2018  . Anemia 10/01/2018  . GERD (gastroesophageal  reflux disease) 10/01/2018  . Malnutrition ([place]) 09/30/2018  . ESRD on hemodialysis ([place]) 09/15/2018  . HFrEF (heart failure with reduced ejection fraction) ([place]) 09/15/2018  . HLD (hyperlipidemia) 09/15/2018  . Secondary hyperparathyroidism ([place]) 08/18/2018  . Hx of fasciotomy 07/09/2018   PCP:  Patient, [name] Pharmacy:   [facility] [place] [address] [place] [address] Phone: [phone] Fax: [phone]  [place], [place] - [place] [address] Phone: [phone] Fax: [phone]  [place], [place] - [place] [address] Phone: [phone] Fax: [phone]     Social Determinants of Health (SDOH) Interventions    Readmission Risk Interventions Readmission Risk Prevention Plan 10/08/2019 08/28/2019 08/10/2019  Transportation Screening Complete Complete Complete  Medication Review Press photographer) Complete Complete Complete  PCP or Specialist appointment within 3-5 days of discharge - (No Data) -  PCP/Specialist Appt Not Complete comments - - -  [place] or Home Care Consult Complete Complete Complete  SW Recovery Care/Counseling Consult - - Complete  Palliative Care Screening - - Patient [place] Not Applicable Not Applicable -

## 2019-10-09 DIAGNOSIS — E861 Hypovolemia: Secondary | ICD-10-CM

## 2019-10-09 DIAGNOSIS — R112 Nausea with vomiting, unspecified: Secondary | ICD-10-CM

## 2019-10-09 DIAGNOSIS — I9589 Other hypotension: Secondary | ICD-10-CM

## 2019-10-09 MED ORDER — GUAIFENESIN ER 600 MG PO TB12: 600.0000 mg | ORAL_TABLET | Freq: Two times a day (BID) | ORAL | 0 refills | Status: AC

## 2019-10-09 MED ORDER — CARVEDILOL 25 MG PO TABS: 25.0000 mg | ORAL_TABLET | Freq: Two times a day (BID) | ORAL | 0 refills | Status: DC

## 2019-10-09 NOTE — Discharge Summary (Signed)
Physician Discharge Summary  Brett Small [id number] DOB: [birth year] DOA: 10/03/2019  PCP: Patient, [name]  Admit date: 10/03/2019 Discharge date: 10/09/2019  Admitted From: Home Disposition:  Home  Recommendations for Outpatient Follow-up:  1. Follow up with PCP in 1-2 weeks   Home Health: Yes Equipment/Devices: Yes  Discharge Condition: Stable CODE STATUS: Full Diet recommendation: Renal  Brief/Interim Summary: 43 year old male with a history of end-stage renal disease on hemodialysis, chronic systolic congestive heart failure with ejection fraction of less than 20%, admitted to the hospital with shortness of breath.  Initially felt to have possible bronchitis he was started on levofloxacin.  He also underwent dialysis which improved her shortness of breath.  Subsequently the following day, he developed nausea and vomiting after eating and subsequently became hypotensive.  Blood pressure was in the 80s.  Staff reported possible coffee-ground emesis.  Abdomen was noted to be somewhat distended.  NG tube was placed and he was made NPO.  We will continue to cycle CBC to follow hemoglobin.  He has been started on Protonix.  12/16: NPO for EGD today  12/17:  Severe esophagitis noted on EGD.  GI recommendations twice daily PPI.  Patient continues to endorse cough and chest congestion.  States that one of the medications we are giving him is making him shake.  12/18: Cough improved. patient did titrate off supplemental oxygen.  Stable discharge.  Discharge Diagnoses:  Active Problems:   End stage renal disease on dialysis (HCC)   Thrombocytopenia (HCC)   Coagulopathy (HCC)   Pleural effusion   Seizure (HCC)   Chronic systolic CHF (congestive heart failure) (HCC)   GERD (gastroesophageal reflux disease)   Secondary hyperparathyroidism (HCC)   Bronchitis   Vomiting   Arm DVT (deep venous thromboembolism), acute, right (HCC)   Hypotension  1. Hypotension.  Unclear  etiology.  Patient did have vomiting with questions of coffee-ground.  Question GI bleeding.  Patient was also prescribed Coreg 25 mg twice daily as well as hydralazine.  He reports that he was not taking these medications at home for quite some time.  These home medications were continued on admission.  Question medication effect leading to hypotension.  He received a small bolus of IV fluids and blood pressures have improved into the 90s.  Coreg and hydralazine have since been held.  Since that time, blood pressures have stabilized.  Continue to monitor closely.  Will restart Coreg at half dose (12.5mg  BID) today.  On discharge can now resume the Coreg at full dose 25 mg twice daily as patient needs some improved blood pressure control.  Communicated with him about the importance of medication adherence.  He requests referral for a PCP which I did discuss with the unit secretary about him leaving.  2. Nausea and vomiting, possible GI bleeding.  Staff reported noticing coffee-ground emesis. EGD 12/16.  Severe erosive esophagitis noted.  Per GI recommendations continue PPI twice daily indefinitely.  Advance diet to regular as tolerated.  Patient tolerating regular diet.  Will prescribe twice daily PPI  3. Chronic systolic congestive heart failure.  Latest echocardiogram shows ejection fraction of less than 20%.  Will need to monitor volume status closely.  He is chronically on beta-blockers, but this is been held due to hypotension.  Restarted beta blockers 12/17.  Seem to be tolerating Coreg.  Will continue on discharge  4. End-stage renal disease on hemodialysis.   Nephrology following.  Tolerating HD without issue resume TTS schedule on discharge  5.  Seizure disorder.  Chronically on Keppra.    6. Acute bronchitis.  Initial concerns for bronchitis on admission.  All abx stopped at this time.  7. Pleural effusion.  Patient noted to have left-sided pleural effusion which is a chronic finding.   Suspect is related to CHF.  He does not appear to be particularly short of breath at this time.  Continue to monitor.  8. Recent right upper extremity DVT.  Started on Eliquis during recent admission. Resume at home dose  9. Thrombocytopenia.  Appears to be present for the past several weeks/months.  Platelet count, although low appears to be stable at this time.  Continue to monitor.  Discharge Instructions  Discharge Instructions    Diet - low sodium heart healthy   Complete by: As directed    Increase activity slowly   Complete by: As directed      Allergies as of 10/09/2019      Reactions   Codeine Nausea Only   Patient questioned this (??)   Sulfa Antibiotics Hives, Nausea And Vomiting      Medication List    STOP taking these medications   levofloxacin 500 MG tablet Commonly known as: LEVAQUIN     TAKE these medications   amLODipine 5 MG tablet Commonly known as: NORVASC Take 1 tablet (5 mg total) by mouth daily. Notes to patient: Morning 10/10/19   apixaban 2.5 MG Tabs tablet Commonly known as: ELIQUIS Take 1 tablet (2.5 mg total) by mouth 2 (two) times daily. Notes to patient: Before bed 10/09/19   atorvastatin 80 MG tablet Commonly known as: LIPITOR Take 1 tablet (80 mg total) by mouth at bedtime. Notes to patient: Before bed 10/09/19   carvedilol 25 MG tablet Commonly known as: COREG Take 1 tablet (25 mg total) by mouth 2 (two) times daily with a meal. What changed: Another medication with the same name was added. Make sure you understand how and when to take each. Notes to patient: With evening meal 10/09/19   carvedilol 25 MG tablet Commonly known as: COREG Take 1 tablet (25 mg total) by mouth 2 (two) times daily with a meal. What changed: You were already taking a medication with the same name, and this prescription was added. Make sure you understand how and when to take each.   feeding supplement (NEPRO CARB STEADY) Liqd Take 237 mLs by mouth  2 (two) times daily between meals. Notes to patient: Afternoon 10/09/19   gabapentin 100 MG capsule Commonly known as: NEURONTIN Take 1 capsule (100 mg total) by mouth 3 (three) times daily. Notes to patient: Afternoon 10/09/19   guaiFENesin 600 MG 12 hr tablet Commonly known as: MUCINEX Take 1 tablet (600 mg total) by mouth 2 (two) times daily for 10 days. Notes to patient: Before bed 10/09/19   hydrALAZINE 25 MG tablet Commonly known as: APRESOLINE Take 1 tablet (25 mg total) by mouth every 8 (eight) hours. Notes to patient: Afternoon 10/09/19   hydrOXYzine 25 MG tablet Commonly known as: ATARAX/VISTARIL Take 25 mg by mouth 3 (three) times daily as needed for anxiety. Notes to patient: As needed   levETIRAcetam 500 MG tablet Commonly known as: KEPPRA Take 1 tablet (500 mg total) by mouth 2 (two) times daily. Notes to patient: Before bed 10/09/19   loratadine 10 MG tablet Commonly known as: CLARITIN Take 1 tablet (10 mg total) by mouth daily. Notes to patient: Morning 10/10/19   b complex-C-folic acid 1 MG capsule Take 1 capsule by  mouth daily after supper. Notes to patient: After evening meal 10/09/19   multivitamin Tabs tablet Take 1 tablet by mouth daily. Notes to patient: Morning 10/10/19   neomycin-bacitracin-polymyxin Oint Commonly known as: NEOSPORIN Apply 1 application topically 2 (two) times daily. What changed:   when to take this  reasons to take this Notes to patient: Before bed 10/09/19   sevelamer carbonate 800 MG tablet Commonly known as: RENVELA Take 2 tablets (1,600 mg total) by mouth 3 (three) times daily with meals. Notes to patient: With evening meal 10/09/19      Follow-up Information    [facility]. Schedule an appointment as soon as possible for a visit in 1 week(s).   Specialty: Nephrology Contact information: [address] [place] [address] [phone]          Allergies   Allergen Reactions  . Codeine Nausea Only    Patient questioned this (??)  . Sulfa Antibiotics Hives and Nausea And Vomiting    Consultations:  Nephrology- [place]   Procedures/Studies: DG Chest 2 View  Result Date: 10/03/2019 CLINICAL DATA:  Chest pressure since yesterday. States has had cough for one week. Was tested for COVID yesterday but does not have result. DIalysis patient with permacath R upper chest. Hx - ESRD, HTN, AAA, current smoker 0.5 ppd. EXAM: CHEST - 2 VIEW COMPARISON:  08/25/2019.  CT, 07/20/2019. FINDINGS: Cardiac silhouette is mildly enlarged. No mediastinal or hilar masses. Right internal jugular tunneled dual lumen central venous catheter is stable, distal tip in the right atrium. Moderate left pleural effusion associated with dependent left lung base opacity, the latter most likely atelectasis although pneumonia is possible. Lungs show prominent bronchovascular markings bilaterally, but are otherwise clear. No pulmonary edema. No pneumothorax. Skeletal structures are grossly intact. Stable left axillary to upper extremity vascular stent. IMPRESSION: 1. Moderate right pleural effusion, which appears decreased compared to the prior chest radiograph. 2. Left lung base opacity, which is most likely atelectasis. Pneumonia is not excluded but felt less likely. 3. No evidence of pulmonary edema.  Mild cardiomegaly. Electronically Signed   By: Lajean Manes M.D.   On: 10/03/2019 12:58   DG Abd 1 View  Result Date: 10/04/2019 CLINICAL DATA:  NG tube placement EXAM: ABDOMEN - 1 VIEW COMPARISON:  10/04/2019 FINDINGS: Interval placement of esophagogastric tube, tip near the gastroesophageal junction and side port above the diaphragm. Recommend advancement to ensure subdiaphragmatic positioning of tip and side port. Nonobstructive pattern of bowel gas in the included abdomen. No obvious free air. Stent projects over the central abdomen. Cardiomegaly. IMPRESSION: Interval placement of  esophagogastric tube, tip near the gastroesophageal junction and side port above the diaphragm. Recommend advancement to ensure subdiaphragmatic positioning of tip and side port. Electronically Signed   By: Eddie Candle M.D.   On: 10/04/2019 17:10   DG Abd 2 Views  Result Date: 10/04/2019 CLINICAL DATA:  Vomiting. EXAM: ABDOMEN - 2 VIEW COMPARISON:  08/25/2019 FINDINGS: There is no bowel dilation to suggest obstruction. No air-fluid levels or free air. Vascular stent overlies the upper to mid lumbar spine. A right common iliac stent is present. These are stable from prior study. There are surgical vascular clips in the central abdomen and left lower quadrant. These are also unchanged. No evidence of renal or ureteral stones. There is opacity at the left lung base, incompletely imaged. Skeletal structures unremarkable. IMPRESSION: 1. No evidence of bowel obstruction or free air.  No acute findings. Electronically Signed  By: Lajean Manes M.D.   On: 10/04/2019 16:11    (Echo, Carotid, EGD, Colonoscopy, ERCP)    Subjective: Seen and examined time of discharge Symptoms improved Not short of breath Not requiring oxygen Medically stable for discharge  Discharge Exam: Vitals:   10/08/19 2231 10/09/19 0536  BP: 118/87 121/75  Pulse: (!) 101 89  Resp:  20  Temp:  98.6 F (37 C)  SpO2: 92% 98%   Vitals:   10/08/19 1930 10/08/19 2007 10/08/19 2231 10/09/19 0536  BP: (!) 151/90 (!) 149/102 118/87 121/75  Pulse: (!) 102 (!) 106 (!) 101 89  Resp: 19 18  20   Temp: 98.4 F (36.9 C) 98.3 F (36.8 C)  98.6 F (37 C)  TempSrc: Oral Oral  Oral  SpO2: 98% 96% 92% 98%  Weight:      Height:        General: Pt is alert, awake, not in acute distress Cardiovascular: RRR, S1/S2 +, no rubs, no gallops Respiratory: CTA bilaterally, no wheezing, no rhonchi Abdominal: Soft, NT, ND, bowel sounds + Extremities: no edema, no cyanosis    The results of significant diagnostics from this  hospitalization (including imaging, microbiology, ancillary and laboratory) are listed below for reference.     Microbiology: Recent Results (from the past 240 hour(s))  Respiratory Panel by RT PCR (Flu A&B, Covid) - Nasopharyngeal Swab     Status: None   Collection Time: 10/03/19  1:38 PM   Specimen: Nasopharyngeal Swab  Result Value Ref Range Status   SARS Coronavirus 2 by RT PCR NEGATIVE NEGATIVE Final    Comment: (NOTE) SARS-CoV-2 target nucleic acids are NOT DETECTED. The SARS-CoV-2 RNA is generally detectable in upper respiratoy specimens during the acute phase of infection. The lowest concentration of SARS-CoV-2 viral copies this assay can detect is 131 copies/mL. A negative result does not preclude SARS-Cov-2 infection and should not be used as the sole basis for treatment or other patient management decisions. A negative result may occur with  improper specimen collection/handling, submission of specimen other than nasopharyngeal swab, presence of viral mutation(s) within the areas targeted by this assay, and inadequate number of viral copies (<131 copies/mL). A negative result must be combined with clinical observations, patient history, and epidemiological information. The expected result is Negative. Fact Sheet for Patients:  PinkCheek.be Fact Sheet for Healthcare Providers:  GravelBags.it This test is not yet ap proved or cleared by the [place] FDA and  has been authorized for detection and/or diagnosis of SARS-CoV-2 by FDA under an Emergency Use Authorization (EUA). This EUA will remain  in effect (meaning this test can be used) for the duration of the COVID-19 declaration under Section 564(b)(1) of the Act, 21 U.S.C. section 360bbb-3(b)(1), unless the authorization is terminated or revoked sooner.    Influenza A by PCR NEGATIVE NEGATIVE Final   Influenza B by PCR NEGATIVE NEGATIVE Final    Comment:  (NOTE) The Xpert Xpress SARS-CoV-2/FLU/RSV assay is intended as an aid in  the diagnosis of influenza from Nasopharyngeal swab specimens and  should not be used as a sole basis for treatment. Nasal washings and  aspirates are unacceptable for Xpert Xpress SARS-CoV-2/FLU/RSV  testing. Fact Sheet for Patients: PinkCheek.be Fact Sheet for Healthcare Providers: GravelBags.it This test is not yet approved or cleared by the [place] FDA and  has been authorized for detection and/or diagnosis of SARS-CoV-2 by  FDA under an Emergency Use Authorization (EUA). This EUA will remain  in effect (meaning this  test can be used) for the duration of the  Covid-19 declaration under Section 564(b)(1) of the Act, 21  U.S.C. section 360bbb-3(b)(1), unless the authorization is  terminated or revoked. Performed at [facility], [address] [place], [place] [address]      Labs: BNP (last 3 results) Recent Labs    07/20/19 1029 08/03/19 0925 08/05/19 0431  BNP >4,500.0* >4,500.0* [id number]*   Basic Metabolic Panel: Recent Labs  Lab 10/04/19 0615 10/05/19 0543 10/06/19 0633 10/07/19 0607 10/08/19 0817  NA 133* 135 131* 134* 133*  K 3.5 3.5 3.7 3.2* 4.6  CL 95* 95* 93* 97* 98  CO2 25 26 24 24  19*  GLUCOSE 111* 93 112* 112* 118*  BUN 30* 52* 58* 28* 44*  CREATININE 3.17* 4.25* 4.78* 2.79* 4.28*  CALCIUM 8.5* 8.0* 8.0* 8.1* 8.5*  PHOS  --  4.3 4.8*  --   --    Liver Function Tests: Recent Labs  Lab 10/04/19 0615 10/05/19 0543 10/06/19 0633  AST 28  --   --   ALT 37  --   --   ALKPHOS 98  --   --   BILITOT 3.9*  --   --   PROT 5.7*  --   --   ALBUMIN 2.6* 2.3* 2.4*   No results for input(s): LIPASE, AMYLASE in the last 168 hours. No results for input(s): AMMONIA in the last 168 hours. CBC: Recent Labs  Lab 10/04/19 2309 10/05/19 0543 10/06/19 [id number] 10/07/19 0607 10/08/19 0817  WBC 9.2 8.8 10.8* 10.0  11.9*  NEUTROABS  --   --   --   --  9.6*  HGB 8.2* 8.3* 8.7* 8.8* 9.2*  HCT 24.5* 25.1* 26.1* 28.3* 29.0*  MCV 88.1 88.4 87.6 92.8 91.8  PLT 78* 79* 94* 143* 182   Cardiac Enzymes: Recent Labs  Lab 10/03/19 1456  CKTOTAL 33*   BNP: Invalid input(s): POCBNP CBG: Recent Labs  Lab 10/04/19 0837 10/04/19 1146  GLUCAP 113* 165*   D-Dimer No results for input(s): DDIMER in the last 72 hours. Hgb A1c No results for input(s): HGBA1C in the last 72 hours. Lipid Profile No results for input(s): CHOL, HDL, LDLCALC, TRIG, CHOLHDL, LDLDIRECT in the last 72 hours. Thyroid function studies No results for input(s): TSH, T4TOTAL, T3FREE, THYROIDAB in the last 72 hours.  Invalid input(s): FREET3 Anemia work up No results for input(s): VITAMINB12, FOLATE, FERRITIN, TIBC, IRON, RETICCTPCT in the last 72 hours. Urinalysis    Component Value Date/Time   COLORURINE AMBER (A) 08/05/2019 2049   APPEARANCEUR CLOUDY (A) 08/05/2019 2049   LABSPEC 1.023 08/05/2019 2049   PHURINE 6.0 08/05/2019 2049   GLUCOSEU NEGATIVE 08/05/2019 2049   HGBUR MODERATE (A) 08/05/2019 2049   [place] NEGATIVE 08/05/2019 2049   [place] 08/05/2019 2049   PROTEINUR 100 (A) 08/05/2019 2049   NITRITE NEGATIVE 08/05/2019 2049   LEUKOCYTESUR SMALL (A) 08/05/2019 2049   Sepsis Labs Invalid input(s): PROCALCITONIN,  WBC,  LACTICIDVEN Microbiology Recent Results (from the past 240 hour(s))  Respiratory Panel by RT PCR (Flu A&B, Covid) - Nasopharyngeal Swab     Status: None   Collection Time: 10/03/19  1:38 PM   Specimen: Nasopharyngeal Swab  Result Value Ref Range Status   SARS Coronavirus 2 by RT PCR NEGATIVE NEGATIVE Final    Comment: (NOTE) SARS-CoV-2 target nucleic acids are NOT DETECTED. The SARS-CoV-2 RNA is generally detectable in upper respiratoy specimens during the acute phase of infection. The lowest concentration of  SARS-CoV-2 viral copies this assay can detect is 131 copies/mL. A  negative result does not preclude SARS-Cov-2 infection and should not be used as the sole basis for treatment or other patient management decisions. A negative result may occur with  improper specimen collection/handling, submission of specimen other than nasopharyngeal swab, presence of viral mutation(s) within the areas targeted by this assay, and inadequate number of viral copies (<131 copies/mL). A negative result must be combined with clinical observations, patient history, and epidemiological information. The expected result is Negative. Fact Sheet for Patients:  PinkCheek.be Fact Sheet for Healthcare Providers:  GravelBags.it This test is not yet ap proved or cleared by the [place] FDA and  has been authorized for detection and/or diagnosis of SARS-CoV-2 by FDA under an Emergency Use Authorization (EUA). This EUA will remain  in effect (meaning this test can be used) for the duration of the COVID-19 declaration under Section 564(b)(1) of the Act, 21 U.S.C. section 360bbb-3(b)(1), unless the authorization is terminated or revoked sooner.    Influenza A by PCR NEGATIVE NEGATIVE Final   Influenza B by PCR NEGATIVE NEGATIVE Final    Comment: (NOTE) The Xpert Xpress SARS-CoV-2/FLU/RSV assay is intended as an aid in  the diagnosis of influenza from Nasopharyngeal swab specimens and  should not be used as a sole basis for treatment. Nasal washings and  aspirates are unacceptable for Xpert Xpress SARS-CoV-2/FLU/RSV  testing. Fact Sheet for Patients: PinkCheek.be Fact Sheet for Healthcare Providers: GravelBags.it This test is not yet approved or cleared by the [place] FDA and  has been authorized for detection and/or diagnosis of SARS-CoV-2 by  FDA under an Emergency Use Authorization (EUA). This EUA will remain  in effect (meaning this test can be used) for  the duration of the  Covid-19 declaration under Section 564(b)(1) of the Act, 21  U.S.C. section 360bbb-3(b)(1), unless the authorization is  terminated or revoked. Performed at [facility], [address] [place], [place] [address]      Time coordinating discharge: Over 30 minutes  SIGNED:   Sidney Ace, MD  [facility] 10/09/2019, 6:03 PM Pager [phone]  If 7PM-7AM, please contact night-coverage [email or web address] Password [email or web address]

## 2019-10-09 NOTE — Progress Notes (Signed)
[place], [place] 10/09/19  Subjective:  Patient completed dialysis yesterday. Tolerated treatment well.    Objective:  Vital signs in last 24 hours:  Temp:  [97.6 F (36.4 C)-98.6 F (37 C)] 98.6 F (37 C) (12/18 0536) Pulse Rate:  [89-106] 89 (12/18 0536) Resp:  [17-22] 20 (12/18 0536) BP: (118-160)/(75-125) 121/75 (12/18 0536) SpO2:  [92 %-100 %] 98 % (12/18 0536)  Weight change:  Filed Weights   10/03/19 1630 10/06/19 0945 10/07/19 1346  Weight: 44.5 kg 46.8 kg 46.8 kg    Intake/Output:    Intake/Output Summary (Last 24 hours) at 10/09/2019 1506 Last data filed at 10/09/2019 0912 Gross per 24 hour  Intake 240 ml  Output 1500 ml  Net -1260 ml     Physical Exam: General:  Chronically ill-appearing, laying in the bed  HEENT  anicteric, moist oral mucous membranes  Pulm/lungs  clear bilateral, normal effort  CVS/Heart  S1S2 no rubs  Abdomen:   Soft, nontender  Extremities:  No lower extremity edema.  Neurologic:  Alert, able to answer questions  Skin:  No acute rashes  Access:  Right IJ PermCath       Basic Metabolic Panel:  Recent Labs  Lab 10/04/19 0615 10/05/19 0543 10/06/19 0633 10/07/19 0607 10/08/19 0817  NA 133* 135 131* 134* 133*  K 3.5 3.5 3.7 3.2* 4.6  CL 95* 95* 93* 97* 98  CO2 25 26 24 24  19*  GLUCOSE 111* 93 112* 112* 118*  BUN 30* 52* 58* 28* 44*  CREATININE 3.17* 4.25* 4.78* 2.79* 4.28*  CALCIUM 8.5* 8.0* 8.0* 8.1* 8.5*  PHOS  --  4.3 4.8*  --   --      CBC: Recent Labs  Lab 10/04/19 2309 10/05/19 0543 10/06/19 0633 10/07/19 0607 10/08/19 0817  WBC 9.2 8.8 10.8* 10.0 11.9*  NEUTROABS  --   --   --   --  9.6*  HGB 8.2* 8.3* 8.7* 8.8* 9.2*  HCT 24.5* 25.1* 26.1* 28.3* 29.0*  MCV 88.1 88.4 87.6 92.8 91.8  PLT 78* 79* 94* 143* 182      Lab Results  Component Value Date   HEPBSAG NON REACTIVE 10/03/2019   HEPBSAB Non Reactive 12/25/2018   HEPBIGM NON REACTIVE 10/03/2019       Microbiology:  Recent Results (from the past 240 hour(s))  Respiratory Panel by RT PCR (Flu A&B, Covid) - Nasopharyngeal Swab     Status: None   Collection Time: 10/03/19  1:38 PM   Specimen: Nasopharyngeal Swab  Result Value Ref Range Status   SARS Coronavirus 2 by RT PCR NEGATIVE NEGATIVE Final    Comment: (NOTE) SARS-CoV-2 target nucleic acids are NOT DETECTED. The SARS-CoV-2 RNA is generally detectable in upper respiratoy specimens during the acute phase of infection. The lowest concentration of SARS-CoV-2 viral copies this assay can detect is 131 copies/mL. A negative result does not preclude SARS-Cov-2 infection and should not be used as the sole basis for treatment or other patient management decisions. A negative result may occur with  improper specimen collection/handling, submission of specimen other than nasopharyngeal swab, presence of viral mutation(s) within the areas targeted by this assay, and inadequate number of viral copies (<131 copies/mL). A negative result must be combined with clinical observations, patient history, and epidemiological information. The expected result is Negative. Fact Sheet for Patients:  PinkCheek.be Fact Sheet for Healthcare Providers:  GravelBags.it This test is not yet ap proved or cleared by the [place] FDA  and  has been authorized for detection and/or diagnosis of SARS-CoV-2 by FDA under an Emergency Use Authorization (EUA). This EUA will remain  in effect (meaning this test can be used) for the duration of the COVID-19 declaration under Section 564(b)(1) of the Act, 21 U.S.C. section 360bbb-3(b)(1), unless the authorization is terminated or revoked sooner.    Influenza A by PCR NEGATIVE NEGATIVE Final   Influenza B by PCR NEGATIVE NEGATIVE Final    Comment: (NOTE) The Xpert Xpress SARS-CoV-2/FLU/RSV assay is intended as an aid in  the diagnosis of influenza  from Nasopharyngeal swab specimens and  should not be used as a sole basis for treatment. Nasal washings and  aspirates are unacceptable for Xpert Xpress SARS-CoV-2/FLU/RSV  testing. Fact Sheet for Patients: PinkCheek.be Fact Sheet for Healthcare Providers: GravelBags.it This test is not yet approved or cleared by the [place] FDA and  has been authorized for detection and/or diagnosis of SARS-CoV-2 by  FDA under an Emergency Use Authorization (EUA). This EUA will remain  in effect (meaning this test can be used) for the duration of the  Covid-19 declaration under Section 564(b)(1) of the Act, 21  U.S.C. section 360bbb-3(b)(1), unless the authorization is  terminated or revoked. Performed at [facility], [address] [place], [place] [address]     Coagulation Studies: No results for input(s): LABPROT, INR in the last 72 hours.  Urinalysis: No results for input(s): COLORURINE, LABSPEC, PHURINE, GLUCOSEU, HGBUR, BILIRUBINUR, KETONESUR, PROTEINUR, UROBILINOGEN, NITRITE, LEUKOCYTESUR in the last 72 hours.  Invalid input(s): APPERANCEUR    Imaging: No results found.   Medications:    . apixaban  2.5 mg Oral BID  . vitamin C  500 mg Oral BID  . atorvastatin  80 mg Oral QHS  . carvedilol  12.5 mg Oral BID WC  . Chlorhexidine Gluconate Cloth  6 each Topical Q0600  . epoetin (EPOGEN/PROCRIT) injection  4,000 Units Intravenous Q T,Th,Sa-HD  . feeding supplement (NEPRO CARB STEADY)  237 mL Oral BID BM  . gabapentin  100 mg Oral BID  . guaiFENesin  600 mg Oral BID  . levETIRAcetam  500 mg Oral BID  . loratadine  10 mg Oral Daily  . multivitamin  1 tablet Oral QHS  . pantoprazole  40 mg Oral BID  . sevelamer carbonate  1,600 mg Oral TID WC  . sodium chloride flush  3 mL Intravenous Once   acetaminophen **OR** acetaminophen, guaiFENesin-dextromethorphan, hydrOXYzine, ipratropium-albuterol,  neomycin-bacitracin-polymyxin, ondansetron **OR** ondansetron (ZOFRAN) IV, phenol  Assessment/ Plan:  43 y.o. male with end-stage renal disease on hemodialysis, hypertension, peripheral vascular disease, depression, history of PEA event October 2020, current smoker  [facility] [address]/TTS/9 IJ catheter  1.  Cough/congestion, shortness of breath -Symptoms improved as compared to admission.  Remains on guaifenesin to help break up mucus.  2. End-stage renal disease -Patient completed dialysis treatment yesterday.  No urgent indication for dialysis today.  We will plan for hemodialysis again tomorrow if still here otherwise he will resume his normal outpatient dialysis treatment tomorrow.   3. Anemia of chronic kidney disease Lab Results  Component Value Date   HGB 9.2 (L) 10/08/2019  -Continue Epogen with dialysis treatments.  4.  Secondary hyperparathyroidism.  Phosphorus 4.8 at last check and acceptable.  Continue to monitor as an outpatient.     LOS: 5 Krimson Massmann 12/18/20203:06 PM  [place], [place]  Note: This note was prepared with Dragon dictation. Any transcription errors are  unintentional

## 2019-10-09 NOTE — TOC Transition Note (Signed)
Transition of Care [facility] - CM/SW Discharge Note   Patient Details  Name: Brett Small MRN: [id number] Date of Birth: 1976/02/04  [facility] CM/SW Contact:  Trecia Rogers, LCSW Phone Number: 10/09/2019, 2:38 PM   Clinical Narrative:     Patient will be discharging home today back to his boarding house. Patient will be following up with [facility] with nursing only. Per Malachy Mood with [facility], a nurse will be out tomorrow to see the patient at his boarding home. Patient does have transportation home, per his nurse. No other needs.    Final next level of care: Home w Home Health Services Barriers to Discharge: No Barriers Identified   Patient Goals and CMS Choice   CMS Medicare.gov Compare Post Acute Care list provided to:: Patient Choice offered to / list presented to : Patient  Discharge Placement                       Discharge Plan and Services                          HH Arranged: RN [facility] Agency: [place] Date [facility] Agency Contacted: 10/09/19 Time [place]: [id number] Representative spoke with at [place]: [place] ([place]) Interventions     Readmission Risk Interventions Readmission Risk Prevention Plan 10/08/2019 08/28/2019 08/10/2019  Transportation Screening Complete Complete Complete  Medication Review Press photographer) Complete Complete Complete  PCP or Specialist appointment within 3-5 days of discharge - (No Data) -  PCP/Specialist Appt Not Complete comments - - -  [place] or Home Care Consult Complete Complete Complete  SW Recovery Care/Counseling Consult - - Complete  Palliative Care Screening - - Patient [name] Not Applicable Not Applicable -

## 2019-10-19 ENCOUNTER — Telehealth (INDEPENDENT_AMBULATORY_CARE_PROVIDER_SITE_OTHER): Payer: Self-pay

## 2019-10-19 NOTE — Telephone Encounter (Signed)
I attempted to contact the patient to reschedule him for his right arm venogram, patient's mailbox is full so I was unable to leave a message.

## 2019-10-27 ENCOUNTER — Observation Stay: Payer: Medicare Other

## 2019-10-27 ENCOUNTER — Encounter: Payer: Self-pay | Admitting: Emergency Medicine

## 2019-10-27 ENCOUNTER — Inpatient Hospital Stay
Admission: EM | Admit: 2019-10-27 | Discharge: 2019-11-04 | DRG: 853 | Disposition: A | Discharge status: Home Health | Payer: Medicare Other | Attending: Internal Medicine | Admitting: Internal Medicine

## 2019-10-27 ENCOUNTER — Other Ambulatory Visit: Payer: Self-pay

## 2019-10-27 ENCOUNTER — Emergency Department: Payer: Medicare Other

## 2019-10-27 DIAGNOSIS — E440 Moderate protein-calorie malnutrition: Secondary | ICD-10-CM | POA: Diagnosis present

## 2019-10-27 DIAGNOSIS — L819 Disorder of pigmentation, unspecified: Secondary | ICD-10-CM | POA: Diagnosis not present

## 2019-10-27 DIAGNOSIS — I429 Cardiomyopathy, unspecified: Secondary | ICD-10-CM | POA: Diagnosis present

## 2019-10-27 DIAGNOSIS — E872 Acidosis: Secondary | ICD-10-CM | POA: Diagnosis not present

## 2019-10-27 DIAGNOSIS — Z882 Allergy status to sulfonamides status: Secondary | ICD-10-CM

## 2019-10-27 DIAGNOSIS — I5022 Chronic systolic (congestive) heart failure: Secondary | ICD-10-CM | POA: Diagnosis not present

## 2019-10-27 DIAGNOSIS — R6521 Severe sepsis with septic shock: Secondary | ICD-10-CM | POA: Diagnosis present

## 2019-10-27 DIAGNOSIS — Z992 Dependence on renal dialysis: Secondary | ICD-10-CM | POA: Diagnosis not present

## 2019-10-27 DIAGNOSIS — Z20822 Contact with and (suspected) exposure to covid-19: Secondary | ICD-10-CM | POA: Diagnosis present

## 2019-10-27 DIAGNOSIS — K5900 Constipation, unspecified: Secondary | ICD-10-CM | POA: Diagnosis not present

## 2019-10-27 DIAGNOSIS — R570 Cardiogenic shock: Secondary | ICD-10-CM | POA: Diagnosis not present

## 2019-10-27 DIAGNOSIS — K219 Gastro-esophageal reflux disease without esophagitis: Secondary | ICD-10-CM | POA: Diagnosis present

## 2019-10-27 DIAGNOSIS — Z515 Encounter for palliative care: Secondary | ICD-10-CM

## 2019-10-27 DIAGNOSIS — N2581 Secondary hyperparathyroidism of renal origin: Secondary | ICD-10-CM | POA: Diagnosis present

## 2019-10-27 DIAGNOSIS — N186 End stage renal disease: Secondary | ICD-10-CM

## 2019-10-27 DIAGNOSIS — T82868A Thrombosis of vascular prosthetic devices, implants and grafts, initial encounter: Secondary | ICD-10-CM | POA: Diagnosis not present

## 2019-10-27 DIAGNOSIS — R531 Weakness: Secondary | ICD-10-CM

## 2019-10-27 DIAGNOSIS — A419 Sepsis, unspecified organism: Principal | ICD-10-CM | POA: Diagnosis present

## 2019-10-27 DIAGNOSIS — Z66 Do not resuscitate: Secondary | ICD-10-CM | POA: Diagnosis not present

## 2019-10-27 DIAGNOSIS — E875 Hyperkalemia: Secondary | ICD-10-CM | POA: Diagnosis not present

## 2019-10-27 DIAGNOSIS — Z79899 Other long term (current) drug therapy: Secondary | ICD-10-CM

## 2019-10-27 DIAGNOSIS — I214 Non-ST elevation (NSTEMI) myocardial infarction: Secondary | ICD-10-CM | POA: Diagnosis not present

## 2019-10-27 DIAGNOSIS — I82621 Acute embolism and thrombosis of deep veins of right upper extremity: Secondary | ICD-10-CM | POA: Diagnosis present

## 2019-10-27 DIAGNOSIS — Z681 Body mass index (BMI) 19 or less, adult: Secondary | ICD-10-CM | POA: Diagnosis not present

## 2019-10-27 DIAGNOSIS — R739 Hyperglycemia, unspecified: Secondary | ICD-10-CM | POA: Diagnosis not present

## 2019-10-27 DIAGNOSIS — Z4659 Encounter for fitting and adjustment of other gastrointestinal appliance and device: Secondary | ICD-10-CM

## 2019-10-27 DIAGNOSIS — J9601 Acute respiratory failure with hypoxia: Secondary | ICD-10-CM | POA: Diagnosis present

## 2019-10-27 DIAGNOSIS — R571 Hypovolemic shock: Secondary | ICD-10-CM | POA: Diagnosis not present

## 2019-10-27 DIAGNOSIS — Z0189 Encounter for other specified special examinations: Secondary | ICD-10-CM

## 2019-10-27 DIAGNOSIS — J189 Pneumonia, unspecified organism: Secondary | ICD-10-CM | POA: Diagnosis present

## 2019-10-27 DIAGNOSIS — Z7901 Long term (current) use of anticoagulants: Secondary | ICD-10-CM

## 2019-10-27 DIAGNOSIS — Z832 Family history of diseases of the blood and blood-forming organs and certain disorders involving the immune mechanism: Secondary | ICD-10-CM

## 2019-10-27 DIAGNOSIS — F17210 Nicotine dependence, cigarettes, uncomplicated: Secondary | ICD-10-CM | POA: Diagnosis present

## 2019-10-27 DIAGNOSIS — I5023 Acute on chronic systolic (congestive) heart failure: Secondary | ICD-10-CM | POA: Diagnosis present

## 2019-10-27 DIAGNOSIS — D631 Anemia in chronic kidney disease: Secondary | ICD-10-CM | POA: Diagnosis present

## 2019-10-27 DIAGNOSIS — G40909 Epilepsy, unspecified, not intractable, without status epilepticus: Secondary | ICD-10-CM | POA: Diagnosis present

## 2019-10-27 DIAGNOSIS — F329 Major depressive disorder, single episode, unspecified: Secondary | ICD-10-CM | POA: Diagnosis present

## 2019-10-27 DIAGNOSIS — I10 Essential (primary) hypertension: Secondary | ICD-10-CM | POA: Diagnosis present

## 2019-10-27 DIAGNOSIS — I739 Peripheral vascular disease, unspecified: Secondary | ICD-10-CM | POA: Diagnosis present

## 2019-10-27 DIAGNOSIS — T884XXA Failed or difficult intubation, initial encounter: Secondary | ICD-10-CM | POA: Diagnosis not present

## 2019-10-27 DIAGNOSIS — D696 Thrombocytopenia, unspecified: Secondary | ICD-10-CM | POA: Diagnosis not present

## 2019-10-27 DIAGNOSIS — I132 Hypertensive heart and chronic kidney disease with heart failure and with stage 5 chronic kidney disease, or end stage renal disease: Secondary | ICD-10-CM | POA: Diagnosis present

## 2019-10-27 DIAGNOSIS — I998 Other disorder of circulatory system: Secondary | ICD-10-CM | POA: Diagnosis not present

## 2019-10-27 DIAGNOSIS — I469 Cardiac arrest, cause unspecified: Secondary | ICD-10-CM | POA: Diagnosis present

## 2019-10-27 DIAGNOSIS — J9602 Acute respiratory failure with hypercapnia: Secondary | ICD-10-CM | POA: Diagnosis present

## 2019-10-27 DIAGNOSIS — I252 Old myocardial infarction: Secondary | ICD-10-CM

## 2019-10-27 DIAGNOSIS — Z885 Allergy status to narcotic agent status: Secondary | ICD-10-CM

## 2019-10-27 DIAGNOSIS — R112 Nausea with vomiting, unspecified: Secondary | ICD-10-CM

## 2019-10-27 DIAGNOSIS — Z86718 Personal history of other venous thrombosis and embolism: Secondary | ICD-10-CM

## 2019-10-27 DIAGNOSIS — J9600 Acute respiratory failure, unspecified whether with hypoxia or hypercapnia: Secondary | ICD-10-CM

## 2019-10-27 DIAGNOSIS — F32A Depression, unspecified: Secondary | ICD-10-CM | POA: Diagnosis present

## 2019-10-27 DIAGNOSIS — J90 Pleural effusion, not elsewhere classified: Secondary | ICD-10-CM | POA: Diagnosis present

## 2019-10-27 DIAGNOSIS — E785 Hyperlipidemia, unspecified: Secondary | ICD-10-CM | POA: Diagnosis present

## 2019-10-27 DIAGNOSIS — R000 Tachycardia, unspecified: Secondary | ICD-10-CM | POA: Diagnosis not present

## 2019-10-27 DIAGNOSIS — J690 Pneumonitis due to inhalation of food and vomit: Secondary | ICD-10-CM | POA: Diagnosis not present

## 2019-10-27 LAB — BASIC METABOLIC PANEL
Anion gap: 24 — ABNORMAL HIGH (ref 5–15)
BUN: 70 mg/dL — ABNORMAL HIGH (ref 6–20)
CO2: 18 mmol/L — ABNORMAL LOW (ref 22–32)
Calcium: 9.2 mg/dL (ref 8.9–10.3)
Chloride: 89 mmol/L — ABNORMAL LOW (ref 98–111)
Creatinine, Ser: 6.34 mg/dL — ABNORMAL HIGH (ref 0.61–1.24)
GFR calc Af Amer: 11 mL/min — ABNORMAL LOW (ref >60)
GFR calc non Af Amer: 10 mL/min — ABNORMAL LOW (ref >60)
Glucose, Bld: 107 mg/dL — ABNORMAL HIGH (ref 70–99)
Potassium: 6.6 mmol/L (ref 3.5–5.1)
Sodium: 131 mmol/L — ABNORMAL LOW (ref 135–145)

## 2019-10-27 LAB — BLOOD GAS, ARTERIAL
Acid-base deficit: 20.6 mmol/L — ABNORMAL HIGH (ref 0.0–2.0)
Bicarbonate: 13.2 mmol/L — ABNORMAL LOW (ref 20.0–28.0)
FIO2: 1
MECHVT: 400 mL
Mechanical Rate: 15
O2 Saturation: 29 %
PEEP: 10 cmH2O
Patient temperature: 37
pCO2 arterial: 69 mmHg (ref 32.0–48.0)
pH, Arterial: <6.9 — CL (ref 7.350–7.450)
pO2, Arterial: 35 mmHg — CL (ref 83.0–108.0)

## 2019-10-27 LAB — COMPREHENSIVE METABOLIC PANEL
ALT: 14 U/L (ref 0–44)
AST: 29 U/L (ref 15–41)
Albumin: 2.1 g/dL — ABNORMAL LOW (ref 3.5–5.0)
Alkaline Phosphatase: 63 U/L (ref 38–126)
Anion gap: 21 — ABNORMAL HIGH (ref 5–15)
BUN: 60 mg/dL — ABNORMAL HIGH (ref 6–20)
CO2: 11 mmol/L — ABNORMAL LOW (ref 22–32)
Calcium: >15 mg/dL (ref 8.9–10.3)
Chloride: 99 mmol/L (ref 98–111)
Creatinine, Ser: 5.18 mg/dL — ABNORMAL HIGH (ref 0.61–1.24)
GFR calc Af Amer: 14 mL/min — ABNORMAL LOW (ref >60)
GFR calc non Af Amer: 12 mL/min — ABNORMAL LOW (ref >60)
Glucose, Bld: 104 mg/dL — ABNORMAL HIGH (ref 70–99)
Potassium: 4.8 mmol/L (ref 3.5–5.1)
Sodium: 131 mmol/L — ABNORMAL LOW (ref 135–145)
Total Bilirubin: 1.9 mg/dL — ABNORMAL HIGH (ref 0.3–1.2)
Total Protein: 5 g/dL — ABNORMAL LOW (ref 6.5–8.1)

## 2019-10-27 LAB — GLUCOSE, CAPILLARY
Glucose-Capillary: 71 mg/dL (ref 70–99)
Glucose-Capillary: 83 mg/dL (ref 70–99)

## 2019-10-27 LAB — CBC
HCT: 31.6 % — ABNORMAL LOW (ref 39.0–52.0)
HCT: 31.5 % — ABNORMAL LOW (ref 39.0–52.0)
Hemoglobin: 9.7 g/dL — ABNORMAL LOW (ref 13.0–17.0)
Hemoglobin: 9 g/dL — ABNORMAL LOW (ref 13.0–17.0)
MCH: 29.1 pg (ref 26.0–34.0)
MCH: 28.6 pg (ref 26.0–34.0)
MCHC: 30.7 g/dL (ref 30.0–36.0)
MCHC: 28.6 g/dL — ABNORMAL LOW (ref 30.0–36.0)
MCV: 94.9 fL (ref 80.0–100.0)
MCV: 100 fL (ref 80.0–100.0)
Platelets: 133 10*3/uL — ABNORMAL LOW (ref 150–400)
Platelets: 71 10*3/uL — ABNORMAL LOW (ref 150–400)
RBC: 3.33 MIL/uL — ABNORMAL LOW (ref 4.22–5.81)
RBC: 3.15 MIL/uL — ABNORMAL LOW (ref 4.22–5.81)
RDW: 22.1 % — ABNORMAL HIGH (ref 11.5–15.5)
RDW: 22.4 % — ABNORMAL HIGH (ref 11.5–15.5)
WBC: 13.3 10*3/uL — ABNORMAL HIGH (ref 4.0–10.5)
WBC: 14.3 10*3/uL — ABNORMAL HIGH (ref 4.0–10.5)
nRBC: 0.2 % (ref 0.0–0.2)
nRBC: 0.5 % — ABNORMAL HIGH (ref 0.0–0.2)

## 2019-10-27 LAB — LACTIC ACID, PLASMA
Lactic Acid, Venous: 7.5 mmol/L (ref 0.5–1.9)
Lactic Acid, Venous: >11 mmol/L (ref 0.5–1.9)

## 2019-10-27 LAB — PROCALCITONIN: Procalcitonin: 2.15 ng/mL

## 2019-10-27 LAB — TROPONIN I (HIGH SENSITIVITY)
Troponin I (High Sensitivity): 63 ng/L — ABNORMAL HIGH (ref <18)
Troponin I (High Sensitivity): 65 ng/L — ABNORMAL HIGH (ref <18)
Troponin I (High Sensitivity): 63 ng/L — ABNORMAL HIGH (ref <18)
Troponin I (High Sensitivity): 105 ng/L (ref <18)
Troponin I (High Sensitivity): 147 ng/L (ref <18)

## 2019-10-27 LAB — POC SARS CORONAVIRUS 2 AG: SARS Coronavirus 2 Ag: NEGATIVE

## 2019-10-27 LAB — MRSA PCR SCREENING: MRSA by PCR: POSITIVE — AB

## 2019-10-27 LAB — APTT: aPTT: 36 seconds (ref 24–36)

## 2019-10-27 LAB — PROTIME-INR
INR: 1.9 — ABNORMAL HIGH (ref 0.8–1.2)
Prothrombin Time: 22 seconds — ABNORMAL HIGH (ref 11.4–15.2)

## 2019-10-27 MED ORDER — HEPARIN SODIUM (PORCINE) 1000 UNIT/ML DIALYSIS
1000.0000 [IU] | INTRAMUSCULAR | Status: DC | PRN
  Filled 2019-10-27: qty 6

## 2019-10-27 MED ORDER — TRAMADOL HCL 50 MG PO TABS
50.0000 mg | ORAL_TABLET | Freq: Two times a day (BID) | ORAL | Status: DC | PRN
  Administered 2019-10-30 – 2019-11-04 (×7): 50 mg via ORAL
  Filled 2019-10-27 (×8): qty 1

## 2019-10-27 MED ORDER — SODIUM CHLORIDE 0.9 % IV SOLN
0.0000 ug/min | INTRAVENOUS | Status: DC
  Filled 2019-10-27: qty 1

## 2019-10-27 MED ORDER — LORAZEPAM 2 MG/ML IJ SOLN
2.0000 mg | INTRAMUSCULAR | Status: DC | PRN
  Administered 2019-10-30: 2 mg via INTRAVENOUS
  Filled 2019-10-27: qty 1

## 2019-10-27 MED ORDER — SODIUM CHLORIDE 0.9 % IV BOLUS
1500.0000 mL | Freq: Once | INTRAVENOUS | Status: AC
  Administered 2019-10-27: 16:00:00 1500 mL via INTRAVENOUS

## 2019-10-27 MED ORDER — SODIUM CHLORIDE 0.9 % IV SOLN
2.0000 g | Freq: Once | INTRAVENOUS | Status: AC
  Administered 2019-10-27: 2 g via INTRAVENOUS
  Filled 2019-10-27: qty 2

## 2019-10-27 MED ORDER — VASOPRESSIN 20 UNIT/ML IV SOLN
0.0300 [IU]/min | INTRAVENOUS | Status: DC
  Administered 2019-10-27: 20:00:00 0.03 [IU]/min via INTRAVENOUS
  Filled 2019-10-27: qty 2

## 2019-10-27 MED ORDER — LORAZEPAM 2 MG/ML IJ SOLN
INTRAMUSCULAR | Status: AC
  Administered 2019-10-27: 4 mg via INTRAVENOUS
  Filled 2019-10-27: qty 2

## 2019-10-27 MED ORDER — SODIUM CHLORIDE 0.9 % IV SOLN
1.0000 g | INTRAVENOUS | Status: DC
  Filled 2019-10-27: qty 1

## 2019-10-27 MED ORDER — NOREPINEPHRINE 4 MG/250ML-% IV SOLN: 0.0000 ug/min | INTRAVENOUS | Status: DC

## 2019-10-27 MED ORDER — HEPARIN SODIUM (PORCINE) 5000 UNIT/ML IJ SOLN
5000.0000 [IU] | Freq: Three times a day (TID) | INTRAMUSCULAR | Status: DC
  Administered 2019-10-27 – 2019-10-28 (×2): 5000 [IU] via SUBCUTANEOUS
  Filled 2019-10-27 (×3): qty 1

## 2019-10-27 MED ORDER — PRISMASOL BGK 4/2.5 32-4-2.5 MEQ/L REPLACEMENT SOLN
Status: DC
  Filled 2019-10-27: qty 5000

## 2019-10-27 MED ORDER — VANCOMYCIN HCL IN DEXTROSE 1-5 GM/200ML-% IV SOLN: 1000.0000 mg | Freq: Once | INTRAVENOUS | Status: DC

## 2019-10-27 MED ORDER — SODIUM BICARBONATE 8.4 % IV SOLN
INTRAVENOUS | Status: AC
  Filled 2019-10-27: qty 50

## 2019-10-27 MED ORDER — BISACODYL 5 MG PO TBEC
5.0000 mg | DELAYED_RELEASE_TABLET | Freq: Every day | ORAL | Status: DC | PRN
  Filled 2019-10-27: qty 1

## 2019-10-27 MED ORDER — CALCIUM GLUCONATE 10 % IV SOLN
1.0000 g | Freq: Once | INTRAVENOUS | Status: DC
  Administered 2019-10-27: 17:00:00 1 g via INTRAVENOUS
  Filled 2019-10-27 (×2): qty 10

## 2019-10-27 MED ORDER — EPINEPHRINE PF 1 MG/ML IJ SOLN
1.0000 mg | Freq: Once | INTRAMUSCULAR | Status: AC
  Administered 2019-10-27: 1 mg via INTRAVENOUS

## 2019-10-27 MED ORDER — MAGNESIUM CITRATE PO SOLN
1.0000 | Freq: Once | ORAL | Status: DC | PRN
  Filled 2019-10-27: qty 296

## 2019-10-27 MED ORDER — PRISMASOL BGK 4/2.5 32-4-2.5 MEQ/L IV SOLN
INTRAVENOUS | Status: DC
  Filled 2019-10-27: qty 5000

## 2019-10-27 MED ORDER — VANCOMYCIN HCL IN DEXTROSE 1-5 GM/200ML-% IV SOLN
1000.0000 mg | Freq: Once | INTRAVENOUS | Status: AC
  Administered 2019-10-27: 1000 mg via INTRAVENOUS
  Filled 2019-10-27: qty 200

## 2019-10-27 MED ORDER — VANCOMYCIN HCL 500 MG/100ML IV SOLN: 500.0000 mg | INTRAVENOUS | Status: DC

## 2019-10-27 MED ORDER — SODIUM BICARBONATE 8.4 % IV SOLN
100.0000 meq | Freq: Once | INTRAVENOUS | Status: AC
  Administered 2019-10-27: 100 meq via INTRAVENOUS
  Filled 2019-10-27: qty 100

## 2019-10-27 MED ORDER — SODIUM CHLORIDE 0.9 % IV SOLN
500.0000 mg | INTRAVENOUS | Status: DC
  Administered 2019-10-27: 500 mg via INTRAVENOUS
  Filled 2019-10-27 (×2): qty 500

## 2019-10-27 MED ORDER — SODIUM CHLORIDE 0.9 % IV SOLN
16.0000 ug/min | INTRAVENOUS | Status: DC
  Filled 2019-10-27: qty 4

## 2019-10-27 MED ORDER — ACETAMINOPHEN 650 MG RE SUPP: 650.0000 mg | Freq: Four times a day (QID) | RECTAL | Status: DC | PRN

## 2019-10-27 MED ORDER — METRONIDAZOLE IN NACL 5-0.79 MG/ML-% IV SOLN
500.0000 mg | Freq: Once | INTRAVENOUS | Status: AC
  Administered 2019-10-27: 500 mg via INTRAVENOUS
  Filled 2019-10-27: qty 100

## 2019-10-27 MED ORDER — HYDROCORTISONE NA SUCCINATE PF 100 MG IJ SOLR
50.0000 mg | Freq: Four times a day (QID) | INTRAMUSCULAR | Status: DC
  Administered 2019-10-27 – 2019-11-04 (×29): 50 mg via INTRAVENOUS
  Filled 2019-10-27: qty 1
  Filled 2019-10-27: qty 2
  Filled 2019-10-27: qty 1
  Filled 2019-10-27: qty 2
  Filled 2019-10-27: qty 1
  Filled 2019-10-27 (×8): qty 2
  Filled 2019-10-27 (×2): qty 1
  Filled 2019-10-27: qty 2
  Filled 2019-10-27: qty 1
  Filled 2019-10-27 (×2): qty 2
  Filled 2019-10-27 (×3): qty 1
  Filled 2019-10-27 (×2): qty 2
  Filled 2019-10-27: qty 1
  Filled 2019-10-27: qty 2
  Filled 2019-10-27: qty 1
  Filled 2019-10-27 (×2): qty 2
  Filled 2019-10-27 (×2): qty 1
  Filled 2019-10-27: qty 2

## 2019-10-27 MED ORDER — CALCIUM GLUCONATE-NACL 1-0.675 GM/50ML-% IV SOLN
1.0000 g | Freq: Once | INTRAVENOUS | Status: DC
  Filled 2019-10-27: qty 50

## 2019-10-27 MED ORDER — VECURONIUM BROMIDE 10 MG IV SOLR: 10.0000 mg | INTRAVENOUS | Status: DC | PRN

## 2019-10-27 MED ORDER — DEXMEDETOMIDINE HCL IN NACL 200 MCG/50ML IV SOLN
0.4000 ug/kg/h | INTRAVENOUS | Status: DC
  Administered 2019-10-27 – 2019-10-28 (×2): 0.4 ug/kg/h via INTRAVENOUS
  Administered 2019-10-28 – 2019-10-29 (×2): 0.8 ug/kg/h via INTRAVENOUS
  Administered 2019-10-29: 0.4 ug/kg/h via INTRAVENOUS
  Administered 2019-10-29: 0.8 ug/kg/h via INTRAVENOUS
  Filled 2019-10-27 (×4): qty 50

## 2019-10-27 MED ORDER — PHENYLEPHRINE HCL-NACL 10-0.9 MG/250ML-% IV SOLN
0.0000 ug/min | INTRAVENOUS | Status: DC
  Filled 2019-10-27: qty 250

## 2019-10-27 MED ORDER — EPINEPHRINE 1 MG/10ML IJ SOSY: PREFILLED_SYRINGE | INTRAMUSCULAR | Status: AC | PRN

## 2019-10-27 MED ORDER — FENTANYL CITRATE (PF) 100 MCG/2ML IJ SOLN
INTRAMUSCULAR | Status: AC
  Administered 2019-10-27: 100 ug via INTRAVENOUS
  Filled 2019-10-27: qty 2

## 2019-10-27 MED ORDER — ONDANSETRON HCL 4 MG PO TABS
4.0000 mg | ORAL_TABLET | Freq: Four times a day (QID) | ORAL | Status: DC | PRN
  Filled 2019-10-27: qty 1

## 2019-10-27 MED ORDER — STERILE WATER FOR INJECTION IV SOLN
INTRAVENOUS | Status: DC
  Filled 2019-10-27 (×6): qty 850

## 2019-10-27 MED ORDER — CALCIUM GLUCONATE 10 % IV SOLN
1.0000 g | Freq: Once | INTRAVENOUS | Status: DC
  Administered 2019-10-27: 1 g via INTRAVENOUS

## 2019-10-27 MED ORDER — ONDANSETRON HCL 4 MG/2ML IJ SOLN
4.0000 mg | Freq: Four times a day (QID) | INTRAMUSCULAR | Status: DC | PRN
  Administered 2019-10-27 – 2019-11-02 (×8): 4 mg via INTRAVENOUS
  Filled 2019-10-27 (×9): qty 2

## 2019-10-27 MED ORDER — NOREPINEPHRINE 16 MG/250ML-% IV SOLN
0.0000 ug/min | INTRAVENOUS | Status: DC
  Administered 2019-10-27: 22:00:00 8 ug/min via INTRAVENOUS
  Administered 2019-10-28: 21:00:00 2 ug/min via INTRAVENOUS
  Filled 2019-10-27 (×2): qty 250

## 2019-10-27 MED ORDER — PATIROMER SORBITEX CALCIUM 8.4 G PO PACK
8.4000 g | PACK | Freq: Every day | ORAL | Status: DC
  Administered 2019-10-27 – 2019-10-31 (×4): 8.4 g via ORAL
  Filled 2019-10-27 (×7): qty 1

## 2019-10-27 MED ORDER — SODIUM CHLORIDE 0.9 % IV SOLN: 2.0000 g | Freq: Once | INTRAVENOUS | Status: DC

## 2019-10-27 MED ORDER — POLYETHYLENE GLYCOL 3350 17 G PO PACK
17.0000 g | PACK | Freq: Every day | ORAL | Status: DC | PRN
  Filled 2019-10-27: qty 1

## 2019-10-27 MED ORDER — VECURONIUM BROMIDE 10 MG IV SOLR
INTRAVENOUS | Status: AC
  Administered 2019-10-27: 10 mg via INTRAVENOUS
  Filled 2019-10-27: qty 10

## 2019-10-27 MED ORDER — FENTANYL CITRATE (PF) 100 MCG/2ML IJ SOLN
100.0000 ug | INTRAMUSCULAR | Status: DC | PRN
  Administered 2019-10-29 – 2019-11-01 (×9): 100 ug via INTRAVENOUS
  Filled 2019-10-27 (×9): qty 2

## 2019-10-27 MED ORDER — NOREPINEPHRINE 4 MG/250ML-% IV SOLN
0.0000 ug/min | INTRAVENOUS | Status: DC
  Administered 2019-10-27: 5 ug/min via INTRAVENOUS

## 2019-10-27 MED ORDER — ACETAMINOPHEN 325 MG PO TABS
650.0000 mg | ORAL_TABLET | Freq: Four times a day (QID) | ORAL | Status: DC | PRN
  Administered 2019-10-30 – 2019-11-03 (×4): 650 mg via ORAL
  Filled 2019-10-27 (×4): qty 2

## 2019-10-27 NOTE — ED Provider Notes (Signed)
[facility] Emergency Department Provider Note  ____________________________________________   First MD Initiated Contact with Patient 10/27/19 1226     (approximate)  I have reviewed the triage vital signs and the nursing notes.   HISTORY  Chief Complaint Emesis   HPI Brett Small is a 44 y.o. male with below list of previous medical conditions presents to the emergency department secondary to central chest discomfort, productive cough with "yellow-green sputum shortness of breath x3 days.  Patient also admits to diarrhea.  Patient denies any nausea or vomiting.  Patient also admits to worsening right ankle pain and redness       Past Medical History:  Diagnosis Date  . ESRD (end stage renal disease) ([place])   . Hypertension   . PAD (peripheral artery disease) (HCC)    Required aortofemoral stent-which had closed and had to redo the procedure and ischemia of limb.  . Peripheral vascular disease ([place])   . Renal disorder   . Secondary hyperparathyroidism of renal origin [facility])     Patient Active Problem List   Diagnosis Date Noted  . Vomiting 10/04/2019  . Arm DVT (deep venous thromboembolism), acute, right ([place]) 10/04/2019  . Hypotension 10/04/2019  . Bronchitis 10/03/2019  . Tobacco abuse 08/24/2019  . Marijuana use 08/24/2019  . PVD (peripheral vascular disease) ([place]) 08/24/2019  . Wound dehiscence, surgical, sequela 08/24/2019  . Chronic systolic CHF (congestive heart failure) ([place]) 08/24/2019  . Volume overload 08/24/2019  . Hyperkalemia 08/24/2019  . Complications due to renal dialysis device, implant, and graft 08/24/2019  . Seizure ([place])   . Malnutrition of moderate degree 08/05/2019  . Acute CHF (congestive heart failure) ([place]) 08/04/2019  . Pulmonary edema 07/27/2019  . Pleural effusion 07/20/2019  . NSTEMI (non-ST elevated myocardial infarction) ([place]) 07/09/2019  . Pneumonia 07/02/2019  . Acute liver failure 05/19/2019  .  Hematemesis 05/19/2019  . Hepatitis   . Thrombocytopenia ([place])   . Coagulopathy ([place])   . Sepsis ([place]) 02/28/2019  . Lobar pneumonia ([place]) 02/28/2019  . Acute respiratory failure ([place]) 02/04/2019  . Acute respiratory failure with hypoxemia ([place]) 01/27/2019  . Depression 01/07/2019  . MDD (major depressive disorder), single episode, severe , no psychosis ([place])   . Homelessness   . Acute respiratory failure with hypoxia ([place]) 12/25/2018  . End stage renal disease on dialysis ([place]) 12/25/2018  . Hypertension 12/25/2018  . Renal osteodystrophy 12/25/2018  . Anemia 10/01/2018  . GERD (gastroesophageal reflux disease) 10/01/2018  . Malnutrition ([place]) 09/30/2018  . ESRD on hemodialysis ([place]) 09/15/2018  . HFrEF (heart failure with reduced ejection fraction) ([place]) 09/15/2018  . HLD (hyperlipidemia) 09/15/2018  . Secondary hyperparathyroidism ([place]) 08/18/2018  . Hx of fasciotomy 07/09/2018    Past Surgical History:  Procedure Laterality Date  . A/V SHUNT INTERVENTION Left 01/19/2019   Procedure: LEFT UPPER EXTREMITY A/V SHUNTOGRAM / UPPER EXTREMITY ANGIOGRAM;  Surgeon: Algernon Huxley, MD;  Location: [facility];  Service: Cardiovascular;  Laterality: Left;  . A/V SHUNT INTERVENTION Left 03/02/2019   Procedure: A/V SHUNT INTERVENTION;  Surgeon: Algernon Huxley, MD;  Location: [place];  Service: Cardiovascular;  Laterality: Left;  . AORTA - FEMORAL ARTERY BYPASS GRAFT    . AV FISTULA PLACEMENT Left 12/26/2018   Procedure: INSERTION OF GORE STRETCH VASCULAR 4-7MM X  45CM IN LEFT UPPER ARM;  Surgeon: Marty Heck, MD;  Location: [facility];  Service: Vascular;  Laterality: Left;  . AV FISTULA PLACEMENT Right 08/12/2019  Procedure: INSERTION OF ARTERIOVENOUS (AV) GORE-TEX GRAFT ARM;  Surgeon: Algernon Huxley, MD;  Location: [facility];  Service: Vascular;  Laterality: Right;  . DIALYSIS/PERMA CATHETER REMOVAL N/A 02/06/2019   Procedure: DIALYSIS/PERMA CATHETER REMOVAL;  Surgeon:  Algernon Huxley, MD;  Location: [place] [facility];  Service: Cardiovascular;  Laterality: N/A;  . ESOPHAGOGASTRODUODENOSCOPY N/A 10/07/2019   Procedure: ESOPHAGOGASTRODUODENOSCOPY (EGD);  Surgeon: Lin Landsman, MD;  [facility]: [facility];  Service: Gastroenterology;  Laterality: N/A;  . LEFT HEART CATH AND CORONARY ANGIOGRAPHY Right 07/10/2019   Procedure: LEFT HEART CATH AND CORONARY ANGIOGRAPHY;  Surgeon: Dionisio David, MD;  Location: [facility];  Service: Cardiovascular;  Laterality: Right;  . PERIPHERAL VASCULAR THROMBECTOMY Left 07/28/2019   Procedure: Left Upper Extremity Dialysis Access [place];  Surgeon: Katha Cabal, MD;  Location: [place] [facility];  Service: Cardiovascular;  Laterality: Left;  . REMOVAL OF A DIALYSIS CATHETER Right 08/26/2019   Procedure: REMOVAL OF A DIALYSIS CATHETER;  Surgeon: Algernon Huxley, MD;  Location: [facility];  Service: Vascular;  Laterality: Right;  . TEMPORARY DIALYSIS CATHETER N/A 08/25/2019   Procedure: TEMPORARY DIALYSIS CATHETER;  Surgeon: Katha Cabal, MD;  Location: [place];  Service: Cardiovascular;  Laterality: N/A;  . UPPER EXTREMITY ANGIOGRAPHY Left 08/13/2019   Procedure: UPPER EXTREMITY ANGIOGRAPHy;  Surgeon: Algernon Huxley, MD;  Location: [place] [facility];  Service: Cardiovascular;  Laterality: Left;    Prior to Admission medications   Medication Sig Start Date End Date Taking? Authorizing Provider  amLODipine (NORVASC) 5 MG tablet Take 1 tablet (5 mg total) by mouth daily. 08/28/19   Danford, Suann Larry, MD  apixaban (ELIQUIS) 2.5 MG TABS tablet Take 1 tablet (2.5 mg total) by mouth 2 (two) times daily. 01/26/19   Clapacs, Madie Reno, MD  atorvastatin (LIPITOR) 80 MG tablet Take 1 tablet (80 mg total) by mouth at bedtime. 01/26/19   Clapacs, Madie Reno, MD  b complex-C-folic acid 1 MG capsule Take 1 capsule by mouth daily after supper.     [provider]  carvedilol (COREG) 25 MG tablet Take 1  tablet (25 mg total) by mouth 2 (two) times daily with a meal. 01/26/19   Clapacs, Madie Reno, MD  carvedilol (COREG) 25 MG tablet Take 1 tablet (25 mg total) by mouth 2 (two) times daily with a meal. 10/09/19 11/08/19  Sreenath, Sudheer B, MD  gabapentin (NEURONTIN) 100 MG capsule Take 1 capsule (100 mg total) by mouth 3 (three) times daily. 01/26/19   Clapacs, Madie Reno, MD  hydrALAZINE (APRESOLINE) 25 MG tablet Take 1 tablet (25 mg total) by mouth every 8 (eight) hours. 01/26/19   Clapacs, Madie Reno, MD  hydrOXYzine (ATARAX/VISTARIL) 25 MG tablet Take 25 mg by mouth 3 (three) times daily as needed for anxiety.    [provider]  levETIRAcetam (KEPPRA) 500 MG tablet Take 1 tablet (500 mg total) by mouth 2 (two) times daily. 08/17/19   Mayo, Pete Pelt, MD  loratadine (CLARITIN) 10 MG tablet Take 1 tablet (10 mg total) by mouth daily. 03/04/19   Salary, Holly Bodily D, MD  multivitamin (RENA-VIT) TABS tablet Take 1 tablet by mouth daily. 01/27/19   Clapacs, Madie Reno, MD  neomycin-bacitracin-polymyxin (NEOSPORIN) OINT Apply 1 application topically 2 (two) times daily. Patient taking differently: Apply 1 application topically as needed.  01/26/19   Clapacs, Madie Reno, MD  Nutritional Supplements (FEEDING SUPPLEMENT, NEPRO CARB STEADY,) LIQD Take 237 mLs by mouth 2 (  two) times daily between meals. 07/06/19   Epifanio Lesches, MD  sevelamer carbonate (RENVELA) 800 MG tablet Take 2 tablets (1,600 mg total) by mouth 3 (three) times daily with meals. 01/26/19   Clapacs, Madie Reno, MD    Allergies Codeine and Sulfa antibiotics  Family History  Problem Relation Age of Onset  . Hypertension Other   . Diabetes Other   . Clotting disorder Father     Social History Social History   Tobacco Use  . Smoking status: Current Every Day Smoker    Packs/day: 0.50    Years: 30.00    Pack years: 15.00    Types: Cigarettes    Last attempt to quit: 06/26/2018    Years since quitting: 1.3  . Smokeless tobacco: Never Used  . Tobacco  comment: smoked for 30 years   Substance Use Topics  . Alcohol use: Not Currently  . Drug use: Yes    Frequency: 1.0 times per week    Types: Marijuana    Comment: Pt states "maybe once or twice a week".     Review of Systems Constitutional: Positive for subjective fever/chills Eyes: No visual changes. ENT: No sore throat. Cardiovascular: Denies chest pain. Respiratory: Positive for dyspnea and productive cough Gastrointestinal: No abdominal pain.  No nausea, no vomiting.  Positive for diarrhea Genitourinary: Negative for dysuria. Musculoskeletal: Negative for neck pain.  Negative for back pain. Integumentary: Negative for rash. Neurological: Negative for headaches, focal weakness or numbness.  ____________________________________________   PHYSICAL EXAM:  VITAL SIGNS: ED Triage Vitals [10/27/19 1209]  Enc Vitals Group     BP      Pulse      Resp      Temp      Temp src      SpO2      Weight 46.8 kg (103 lb 2.8 oz)     Height 1.549 m (5\' 1" )     Head Circumference      Peak Flow      Pain Score 5     Pain Loc      Pain Edu?      Excl. in [place]?     Constitutional: Alert and oriented.  Eyes: Conjunctivae are normal.  Mouth/Throat: Patient is wearing a mask. Neck: No stridor.  No meningeal signs.   Cardiovascular: Normal rate, regular rhythm. Good peripheral circulation. Grossly normal heart sounds. Respiratory: Normal respiratory effort.  No retractions. Gastrointestinal: Soft and nontender. No distention.  Musculoskeletal: No lower extremity tenderness nor edema. No gross deformities of extremities. Neurologic:  Normal speech and language. No gross focal neurologic deficits are appreciated.  Skin:  Skin is warm, dry and intact. Psychiatric: Mood and affect are normal. Speech and behavior are normal.  ____________________________________________   LABS (all labs ordered are listed, but only abnormal results are displayed)  Labs Reviewed  CULTURE, BLOOD  (ROUTINE X 2)  CULTURE, BLOOD (ROUTINE X 2)  URINE CULTURE  BASIC METABOLIC PANEL  CBC  LACTIC ACID, PLASMA  LACTIC ACID, PLASMA  APTT  PROTIME-INR  URINALYSIS, ROUTINE W REFLEX MICROSCOPIC  POC SARS CORONAVIRUS 2 AG -  ED  TROPONIN I (HIGH SENSITIVITY)   ____________________________________________  EKG  ED ECG REPORT I, Craig N Sheriann Newmann, the attending physician, personally viewed and interpreted this ECG.   Date: 10/27/2019  EKG Time: 1:09 PM  Rate: 112  Rhythm: Sinus tachycardia  Axis: Normal  Intervals: Normal  ST&T Change: None  ____________________________________________  RADIOLOGY I, [place] Ernst Bowler, personally  viewed and evaluated these images (plain radiographs) as part of my medical decision making, as well as reviewing the written report by the radiologist.  ED MD interpretation: Left lower lobe consolidation with pleural effusion  Official radiology report(s): DG Chest 2 View  Result Date: 10/27/2019 CLINICAL DATA:  44 year old male with history of chest pain. Vomiting for the past 2 weeks. Diarrhea since yesterday. EXAM: CHEST - 2 VIEW COMPARISON:  Chest x-ray 10/03/2019. FINDINGS: Right internal jugular PermCath with tips terminating in the right atrium and superior cavoatrial junction. Left upper extremity vascular stent extending into the axillary region. Lung volumes are slightly low. Opacity at the left base may reflect atelectasis and/or consolidation. Small to moderate left pleural effusion, slightly decreased compared to the prior study. Right lung is clear. No right pleural effusion. No pneumothorax. No definite suspicious appearing pulmonary nodules or masses. No evidence of pulmonary edema. Mild cardiomegaly. Upper mediastinal contours are within normal limits. IMPRESSION: 1. Support apparatus and postoperative changes, as above. 2. Left lower lobe atelectasis and/or consolidation with superimposed small to moderate left pleural effusion, slightly  decreased compared to the prior study. Electronically Signed   By: Vinnie Langton M.D.   On: 10/27/2019 12:36    _______________________________  .Critical Care Performed by: Gregor Hams, MD Authorized by: Gregor Hams, MD   Critical care provider statement:    Critical care time (minutes):  45   Critical care time was exclusive of:  Separately billable procedures and treating other patients   Critical care was necessary to treat or prevent imminent or life-threatening deterioration of the following conditions:  Sepsis   Critical care was time spent personally by me on the following activities:  Development of treatment plan with patient or surrogate, discussions with consultants, evaluation of patient's response to treatment, examination of patient, obtaining history from patient or surrogate, ordering and performing treatments and interventions, ordering and review of laboratory studies, ordering and review of radiographic studies, pulse oximetry, re-evaluation of patient's condition and review of old charts     ____________________________________________   INITIAL IMPRESSION / MDM / [place] / ED COURSE  As part of my medical decision making, I reviewed the following data within the electronic MEDICAL RECORD NUMBER  44 year old male presented to the emergency department meeting criteria for sepsis.  As such sepsis protocol was initiated patient receiving IV vancomycin cefepime and Flagyl.  Patient also given IV fluids 30/kg.  Laboratory data notable for white blood cell count of 13 lactic acid of 7.5, potassium of 6.6 and an elevated troponin of 63.  Regarding patient's hyperkalemia patient given IV calcium gluconate 1 g and Veltassa.  Patient discussed with Dr. Arbutus Ped hospitalist for hospital admission for further evaluation and management.   ____________________________________________  FINAL CLINICAL IMPRESSION(S) / ED DIAGNOSES  Final diagnoses:  Sepsis,  due to unspecified organism, unspecified whether acute organ dysfunction present ([place])  Hyperkalemia     MEDICATIONS GIVEN DURING THIS VISIT:  Medications  ceFEPIme (MAXIPIME) 2 g in sodium chloride 0.9 % 100 mL IVPB (has no administration in time range)  metroNIDAZOLE (FLAGYL) IVPB 500 mg (has no administration in time range)  vancomycin (VANCOCIN) IVPB 1000 mg/200 mL premix (has no administration in time range)     ED Discharge Orders    None      *Please note:  Brett Small was evaluated in Emergency Department on 10/27/2019 for the symptoms described in the history of present illness. He was evaluated in the context of  the global COVID-19 pandemic, which necessitated consideration that the patient might be at risk for infection with the SARS-CoV-2 virus that causes COVID-19. Institutional protocols and algorithms that pertain to the evaluation of patients at risk for COVID-19 are in a state of rapid change based on information released by regulatory bodies including the CDC and federal and state organizations. These policies and algorithms were followed during the patient's care in the ED.  Some ED evaluations and interventions may be delayed as a result of limited staffing during the pandemic.*  Note:  This document was prepared using Dragon voice recognition software and may include unintentional dictation errors.   Gregor Hams, MD 10/27/19 [phone]

## 2019-10-27 NOTE — ED Notes (Signed)
RN able to obtain 1st set of BS. Unable to obtain 2nd set of [facility], per Care link RN, this RN can start ABX.

## 2019-10-27 NOTE — Progress Notes (Signed)
Pt assessment - no palpable pulse - Code Blue called

## 2019-10-27 NOTE — Progress Notes (Signed)
Pharmacy Antibiotic Note  Brett Small is a 44 y.o. male admitted on 10/27/2019. Pharmacy has been consulted for vancomycin and cefepime dosing.  Patient is on HD TTS. Missed session today. Pending nephrology consult.  Plan: Loading dose of vanc and cefepime given in ED.  Vanc 500 mg IV TTS to start Thursday. Will f/u plan for dialysis in am and adjust dosing schedule as clinically indicated.  Cefepime 1 g IV q24h to start tomorrow evening. To be given after dialysis on HD days  Height: 5\' 1"  (154.9 cm) Weight: 103 lb 2.8 oz (46.8 kg) IBW/kg (Calculated) : 52.3  Temp (24hrs), Avg:98.2 F (36.8 C), Min:98.2 F (36.8 C), Max:98.2 F (36.8 C)  Recent Labs  Lab 10/27/19 1307  WBC 13.3*  CREATININE 6.34*  LATICACIDVEN 7.5*    Estimated Creatinine Clearance: 9.7 mL/min (A) (by C-G formula based on SCr of 6.34 mg/dL (H)).    Allergies  Allergen Reactions  . Codeine Nausea Only    Patient questioned this (??)  . Sulfa Antibiotics Hives and Nausea And Vomiting    Antimicrobials this admission: Vanc 1/5 >>  Cefepime 1/5 >>  Azithromycin 1/5 >>  Dose adjustments this admission: NA  Microbiology results: 1/5 BCx: pending 1/5 UCx: pending    Thank you for allowing pharmacy to be a part of this patient's care.  Tawnya Crook, PharmD 10/27/2019 3:32 PM

## 2019-10-27 NOTE — H&P (Addendum)
History and Physical    [name] UYQ:[id number] DOB: [birth year] DOA: 10/27/2019  PCP: Patient, [name]   Patient coming from: Home  I have personally briefly reviewed patient's old medical records in [place]  Chief Complaint: Weakness, cough, intractable vomiting  HPI: Brett Small is a 44 y.o. male with medical history significant of ESRD on dialysis, hypertension, peripheral artery disease who presented to the ED today for ongoing and progressive generalized weakness, persistent cough and intractable nausea and vomiting.  He denies any shortness of breath, fevers, chills, chest pain, abdominal pain or sinus congestion.  Does report a sore throat which he attributes to coughing fits and from vomiting.  States he is on and unable to keep down any food so has had very little p.o. intake for several days.  Denies any known sick contacts.  He does denies any alleviating factors.  States he was unable to attend his dialysis session today due to feeling too sick to go.  ED Course: Met sepsis criteria due to tachycardia with heart rate 114, leukocytosis of 13k, and chest x-ray concerning for pneumonia.  Afebrile and no hypoxia.  Labs remarkable for sodium 131, potassium 6.6, chloride 89, CO2 18, BUN 70, creatinine 6.34 with anion gap 24.  Lactic acid 7.5.  Highly sensitive troponin 63, repeat 65.  Leukocytosis 13.3, anemia with hemoglobin 9.7 (chronic and stable).  Rapid Covid test was negative.  Chest x-ray showed left lower lobe opacity, atelectasis versus infection, and small to moderate left-sided pleural effusion.  Patient received broad-spectrum antibiotics per sepsis protocol and was admitted to hospitalist service for further evaluation and management.  Nephrology consulted for dialysis.   COVID-19 PCR test is pending, PUI for now.  Review of Systems: As per HPI otherwise 10 point review of systems negative.     Past Medical History:  Diagnosis Date  . ESRD (end stage  renal disease) ([place])   . Hypertension   . PAD (peripheral artery disease) (HCC)    Required aortofemoral stent-which had closed and had to redo the procedure and ischemia of limb.  . Peripheral vascular disease ([place])   . Renal disorder   . Secondary hyperparathyroidism of renal origin [facility])     Past Surgical History:  Procedure Laterality Date  . A/V SHUNT INTERVENTION Left 01/19/2019   Procedure: LEFT UPPER EXTREMITY A/V SHUNTOGRAM / UPPER EXTREMITY ANGIOGRAM;  Surgeon: Algernon Huxley, MD;  Location: [place] [facility];  Service: Cardiovascular;  Laterality: Left;  . A/V SHUNT INTERVENTION Left 03/02/2019   Procedure: A/V SHUNT INTERVENTION;  Surgeon: Algernon Huxley, MD;  Location: [place] [facility];  Service: Cardiovascular;  Laterality: Left;  . AORTA - FEMORAL ARTERY BYPASS GRAFT    . AV FISTULA PLACEMENT Left 12/26/2018   Procedure: INSERTION OF GORE STRETCH VASCULAR 4-7MM X  45CM IN LEFT UPPER ARM;  Surgeon: Marty Heck, MD;  Location: [place];  Service: Vascular;  Laterality: Left;  . AV FISTULA PLACEMENT Right 08/12/2019   Procedure: INSERTION OF ARTERIOVENOUS (AV) GORE-TEX GRAFT ARM;  Surgeon: Algernon Huxley, MD;  Location: [facility];  Service: Vascular;  Laterality: Right;  . DIALYSIS/PERMA CATHETER REMOVAL N/A 02/06/2019   Procedure: DIALYSIS/PERMA CATHETER REMOVAL;  Surgeon: Algernon Huxley, MD;  Location: [place] [facility];  Service: Cardiovascular;  Laterality: N/A;  . ESOPHAGOGASTRODUODENOSCOPY N/A 10/07/2019   Procedure: ESOPHAGOGASTRODUODENOSCOPY (EGD);  Surgeon: Lin Landsman, MD;  Location: [facility];  Service: Gastroenterology;  Laterality: N/A;  . LEFT  HEART CATH AND CORONARY ANGIOGRAPHY Right 07/10/2019   Procedure: LEFT HEART CATH AND CORONARY ANGIOGRAPHY;  Surgeon: Dionisio David, MD;  Location: [place] [facility];  Service: Cardiovascular;  Laterality: Right;  . PERIPHERAL VASCULAR THROMBECTOMY Left 07/28/2019   Procedure: Left Upper Extremity Dialysis  Access [place];  Surgeon: Katha Cabal, MD;  Location: [place] [facility];  Service: Cardiovascular;  Laterality: Left;  . REMOVAL OF A DIALYSIS CATHETER Right 08/26/2019   Procedure: REMOVAL OF A DIALYSIS CATHETER;  Surgeon: Algernon Huxley, MD;  Location: [facility];  Service: Vascular;  Laterality: Right;  . TEMPORARY DIALYSIS CATHETER N/A 08/25/2019   Procedure: TEMPORARY DIALYSIS CATHETER;  Surgeon: Katha Cabal, MD;  Location: [place] [facility];  Service: Cardiovascular;  Laterality: N/A;  . UPPER EXTREMITY ANGIOGRAPHY Left 08/13/2019   Procedure: UPPER EXTREMITY ANGIOGRAPHy;  Surgeon: Algernon Huxley, MD;  Location: [place] [facility];  Service: Cardiovascular;  Laterality: Left;     reports that he has been smoking cigarettes. He has a 15.00 pack-year smoking history. He has never used smokeless tobacco. He reports previous alcohol use. He reports current drug use. Frequency: 1.00 time per week. Drug: Marijuana.  Allergies  Allergen Reactions  . Codeine Nausea Only    Patient questioned this (??)  . Sulfa Antibiotics Hives and Nausea And Vomiting    Family History  Problem Relation Age of Onset  . Hypertension Other   . Diabetes Other   . Clotting disorder Father     Prior to Admission medications   Medication Sig Start Date End Date Taking? Authorizing Provider  amLODipine (NORVASC) 5 MG tablet Take 1 tablet (5 mg total) by mouth daily. 08/28/19   Danford, Suann Larry, MD  apixaban (ELIQUIS) 2.5 MG TABS tablet Take 1 tablet (2.5 mg total) by mouth 2 (two) times daily. 01/26/19   Clapacs, Madie Reno, MD  atorvastatin (LIPITOR) 80 MG tablet Take 1 tablet (80 mg total) by mouth at bedtime. 01/26/19   Clapacs, Madie Reno, MD  b complex-C-folic acid 1 MG capsule Take 1 capsule by mouth daily after supper.     [provider]  carvedilol (COREG) 25 MG tablet Take 1 tablet (25 mg total) by mouth 2 (two) times daily with a meal. 01/26/19   Clapacs, Madie Reno, MD  carvedilol (COREG)  25 MG tablet Take 1 tablet (25 mg total) by mouth 2 (two) times daily with a meal. 10/09/19 11/08/19  Sreenath, Sudheer B, MD  gabapentin (NEURONTIN) 100 MG capsule Take 1 capsule (100 mg total) by mouth 3 (three) times daily. 01/26/19   Clapacs, Madie Reno, MD  hydrALAZINE (APRESOLINE) 25 MG tablet Take 1 tablet (25 mg total) by mouth every 8 (eight) hours. 01/26/19   Clapacs, Madie Reno, MD  hydrOXYzine (ATARAX/VISTARIL) 25 MG tablet Take 25 mg by mouth 3 (three) times daily as needed for anxiety.    [provider]  levETIRAcetam (KEPPRA) 500 MG tablet Take 1 tablet (500 mg total) by mouth 2 (two) times daily. 08/17/19   Mayo, Pete Pelt, MD  loratadine (CLARITIN) 10 MG tablet Take 1 tablet (10 mg total) by mouth daily. 03/04/19   Salary, Holly Bodily D, MD  multivitamin (RENA-VIT) TABS tablet Take 1 tablet by mouth daily. 01/27/19   Clapacs, Madie Reno, MD  neomycin-bacitracin-polymyxin (NEOSPORIN) OINT Apply 1 application topically 2 (two) times daily. Patient taking differently: Apply 1 application topically as needed.  01/26/19   Clapacs, Madie Reno, MD  Nutritional Supplements (FEEDING SUPPLEMENT,  NEPRO CARB STEADY,) LIQD Take 237 mLs by mouth 2 (two) times daily between meals. 07/06/19   Epifanio Lesches, MD  sevelamer carbonate (RENVELA) 800 MG tablet Take 2 tablets (1,600 mg total) by mouth 3 (three) times daily with meals. 01/26/19   Clapacs, Madie Reno, MD    Physical Exam: Vitals:   10/27/19 1209 10/27/19 1344  BP:  (!) 133/91  Pulse:  (!) 114  Resp:  18  Temp:  98.2 F (36.8 C)  TempSrc:  Oral  SpO2:  98%  Weight: 46.8 kg   Height: '5\' 1"'  (1.549 m)      Constitutional: NAD, calm, comfortable, underweight, chronically ill-appearing Eyes: EOMI, lids and conjunctivae normal ENMT: Dry mucous membranes.  Hearing grossly normal Respiratory: clear to auscultation bilaterally, diminished at the left base, no wheezing, no rhonchi. Normal respiratory effort. No accessory muscle use.  Cardiovascular:  Regular rate and rhythm, no murmurs / rubs / gallops. No extremity edema.  Dialysis catheter in place right anterior chest wall with no surrounding warmth erythema or induration.. Abdomen: Mildly tender diffusely, no masses palpated. Bowel sounds positive.  Musculoskeletal: no clubbing / cyanosis. No joint deformity upper and lower extremities.  Skin: Dry, intact, normal temperature and color Neurologic: CN 2-12 grossly intact.  Normal speech. Psychiatric: Normal judgment and insight. Alert and oriented x 3. Normal mood.    Labs on Admission: I have personally reviewed following labs and imaging studies  CBC: Recent Labs  Lab 10/27/19 1307  WBC 13.3*  HGB 9.7*  HCT 31.6*  MCV 94.9  PLT 267*   Basic Metabolic Panel: Recent Labs  Lab 10/27/19 1307  NA 131*  K 6.6*  CL 89*  CO2 18*  GLUCOSE 107*  BUN 70*  CREATININE 6.34*  CALCIUM 9.2   GFR: Estimated Creatinine Clearance: 9.7 mL/min (A) (by C-G formula based on SCr of 6.34 mg/dL (H)). Liver Function Tests: No results for input(s): AST, ALT, ALKPHOS, BILITOT, PROT, ALBUMIN in the last 168 hours. No results for input(s): LIPASE, AMYLASE in the last 168 hours. No results for input(s): AMMONIA in the last 168 hours. Coagulation Profile: Recent Labs  Lab 10/27/19 1449  INR 1.9*   Cardiac Enzymes: No results for input(s): CKTOTAL, CKMB, CKMBINDEX, TROPONINI in the last 168 hours. BNP (last 3 results) No results for input(s): PROBNP in the last 8760 hours. HbA1C: No results for input(s): HGBA1C in the last 72 hours. CBG: No results for input(s): GLUCAP in the last 168 hours. Lipid Profile: No results for input(s): CHOL, HDL, LDLCALC, TRIG, CHOLHDL, LDLDIRECT in the last 72 hours. Thyroid Function Tests: No results for input(s): TSH, T4TOTAL, FREET4, T3FREE, THYROIDAB in the last 72 hours. Anemia Panel: No results for input(s): VITAMINB12, FOLATE, FERRITIN, TIBC, IRON, RETICCTPCT in the last 72 hours. Urine  analysis:    Component Value Date/Time   COLORURINE AMBER (A) 08/05/2019 2049   APPEARANCEUR CLOUDY (A) 08/05/2019 2049   LABSPEC 1.023 08/05/2019 2049   PHURINE 6.0 08/05/2019 2049   GLUCOSEU NEGATIVE 08/05/2019 2049   HGBUR MODERATE (A) 08/05/2019 2049   BILIRUBINUR NEGATIVE 08/05/2019 2049   [place] 08/05/2019 2049   PROTEINUR 100 (A) 08/05/2019 2049   NITRITE NEGATIVE 08/05/2019 2049   LEUKOCYTESUR SMALL (A) 08/05/2019 2049    Radiological Exams on Admission: DG Chest 2 View  Result Date: 10/27/2019 CLINICAL DATA:  44 year old male with history of chest pain. Vomiting for the past 2 weeks. Diarrhea since yesterday. EXAM: CHEST - 2 VIEW COMPARISON:  Chest x-ray  10/03/2019. FINDINGS: Right internal jugular PermCath with tips terminating in the right atrium and superior cavoatrial junction. Left upper extremity vascular stent extending into the axillary region. Lung volumes are slightly low. Opacity at the left base may reflect atelectasis and/or consolidation. Small to moderate left pleural effusion, slightly decreased compared to the prior study. Right lung is clear. No right pleural effusion. No pneumothorax. No definite suspicious appearing pulmonary nodules or masses. No evidence of pulmonary edema. Mild cardiomegaly. Upper mediastinal contours are within normal limits. IMPRESSION: 1. Support apparatus and postoperative changes, as above. 2. Left lower lobe atelectasis and/or consolidation with superimposed small to moderate left pleural effusion, slightly decreased compared to the prior study. Electronically Signed   By: Vinnie Langton M.D.   On: 10/27/2019 12:36   DG Ankle Complete Right  Result Date: 10/27/2019 CLINICAL DATA:  Chronic wound with cellulitis EXAM: RIGHT ANKLE - COMPLETE 3+ VIEW COMPARISON:  None. FINDINGS: Frontal, oblique, and lateral views obtained. There is evidence of old trauma involving the calcaneus with remodeling. No acute fracture or joint effusion  is evident. There is narrowing of the posterior aspect of the ankle joint. There is arthropathy throughout the subtalar joint regions. No frank erosion or bony destruction evident. IMPRESSION: Evidence of old trauma involving the calcaneus. Arthropathy throughout the subtalar joints. There is narrowing of the posterior ankle joint. No frank bony destruction appreciated by radiography. No acute fracture or joint effusion evident. If there remains concern for osteomyelitis, MR or nuclear medicine three-phase bone scan could be helpful for further assessment. Electronically Signed   By: Lowella Grip III M.D.   On: 10/27/2019 13:09    EKG: Independently reviewed.  Sinus tachycardia at 112 bpm, normal axis, possible delta waves, and lateral ST and T wave abnormalities, no peaked T waves, QTC prolonged at 505 ms.  Assessment/Plan Principal Problem:   Sepsis due to pneumonia [facility]) Active Problems:   NSTEMI (non-ST elevated myocardial infarction) (HCC)   Nausea & vomiting   Generalized weakness   End stage renal disease on dialysis (HCC)   Pleural effusion   Chronic systolic CHF (congestive heart failure) (HCC)   Hypertension   Depression   PVD (peripheral vascular disease) ([place])  ADDENDUM - patient had cardiac arrest in ED room while holding for a bed.  CPR was performed for approx 15 minutes.  PEA on monitor.  ROSC achieved with CPR and epi.  Patient now intubated and will go to ICU.  Patient's father updated by phone regarding status.   Sepsis due to pneumonia [facility]) -as evidenced by tachycardia, leukocytosis, chest x-ray showing possible pneumonia.  Received broad-spectrum antibiotics and IV fluids in the ED.  Rapid Covid test was negative. --COVID-19 PCR test pending, PUI for now. --Continue broad-spectrum antibiotics for now (vancomycin, cefepime, azithromycin) given his recent admission and dialysis, he has frequent exposure to healthcare settings --MRSA screen pending -discontinue  vancomycin if negative --Procalcitonin pending --Sputum and blood cultures pending --Legionella and strep pneumo and urinary antigens pending   NSTEMI (non-ST elevated myocardial infarction) (HCC) Troponins 63 ->65 with lateral ST-T wave changes on ECG, no ST elevation.   Patient without active chest pain on admission.  Patient does have a prior history of MI. Some degree of troponin elevation may be due to end-stage renal disease. --Cardiology consulted --Trend troponins -Stat EKG if active chest pain --will hold off on heparin gtt, pending cardiology consult , given no chest pain and nonspecific EKG changes  Nausea and Vomiting --Zofran PRN  Generalized Weakness - secondary to above.  Expect improvement with above treatment. --PT eval if not improving, defer for now  End stage renal disease on dialysis [facility]) -dialysis Tuesday/Thursday/Saturday Hyperkalemia secondary to above -no hyperacute T waves on EKG --Nephrology consulted --missed dialysis appointment today  Pleural effusion - likely due to ESRD and CHF.  This does not seem to be affecting patient's respiratory status at this time, and is actually smaller than on prior imaging.  Monitor respiratory status and consider thoracentesis if hypoxic or becoming dyspneic.  Chronic systolic CHF (congestive heart failure) ([place]) - well compensated on admission. Ejection fraction on echo from 08/04/2019 showed EF less than 20%  Hypertension - chronic, stable Depression - chronic, stable PVD (peripheral vascular disease) ([place])  --Will continue home medications for chronic conditions once med history and reconciliation are complete   DVT prophylaxis: Heparin Code Status: Full - confirmed with patient on admission, as he has prior admissions with DNR status.  Patient verified he would want any and all measures taken in event of cardiac or respiratory arrest.  Family Communication: none at bedside on admission Disposition Plan: Pending  clinical improvement, expect discharge home Consults called: Cardiology, Dr. Clayborn Bigness  /  Nephrology, Dr. Holley Raring  Admission status: obs   Brett Slocumb, DO [facility]   If 7PM-7AM, please contact night-coverage [email or web address] Password [facility]  10/27/2019, 5:31 PM

## 2019-10-27 NOTE — ED Triage Notes (Signed)
Here for chest pain to center of chest.  Is dialysis pt.  Missed dialysis today.  Worried about infection in right ankle.  No fever. Vomiting for 2 weeks. Diarrhea started yesterday.  Pain to upper chest.  Has had a cough.  Yellow/green productive sputum.  Did get dialysis last Saturday.  Also c/o heart burn.

## 2019-10-27 NOTE — Progress Notes (Signed)
Patient's father Lakoda Tondre, and patient's sister at bedside.  Discussed with them patient's critical status, multiorgan failure, poor prognosis, and the fact that he has high risk for subsequent cardiac arrest.  They are in agreement for continuing aggressive interventions for the next 24 to 48 hours with reassessment.  They also consent and agreeable to DNR.  We will continue current supportive measures and place orders for DNR.  Discussed with nursing.  Darel Hong, AGACNP-BC [facility] Pager: [phone]

## 2019-10-27 NOTE — ED Notes (Signed)
CPR stopped. Pulse noted, ST on monitor. ROSC, Pt being bagged at this time.

## 2019-10-27 NOTE — Consult Note (Signed)
CARDIOLOGY CONSULT NOTE               Patient ID: Brett Small MRN: [id number] DOB/AGE: 46-Apr-1977 46 y.o.  Admit date: 10/27/2019 Referring Physician Dr. Laurann Montana hospitalist Primary Physician Dr. Mortimer Fries critical care Primary Cardiologist none Reason for Consultation elevated troponin cardiac arrest  HPI: Is a 4 cardiac arrest respiratory failure hypotension requiring CPR briefly blood pressure support respiratory support subsequently was transferred to ICU for further management.  There are some concern the patient may have suffered shock with hypotension possible sepsis.  Cardiac evaluation is being pursued he initially had mildly elevated troponins no significant cardiac history end-stage renal disease on dialysis now here for further cardiac assessment EKG was nondiagnostic no evidence of chest pain  Review of systems complete and found to be negative unless listed above     Past Medical History:  Diagnosis Date  . ESRD (end stage renal disease) ([place])   . Hypertension   . PAD (peripheral artery disease) ([facility])    Required aortofemoral stent-which had closed and had to redo the procedure and ischemia of limb.  . Peripheral vascular disease ([place])   . Renal disorder   . Secondary hyperparathyroidism of renal origin [facility])     Past Surgical History:  Procedure Laterality Date  . A/V SHUNT INTERVENTION Left 01/19/2019   Procedure: LEFT UPPER EXTREMITY A/V SHUNTOGRAM / UPPER EXTREMITY ANGIOGRAM;  Surgeon: Algernon Huxley, MD;  Location: [place] [facility];  Service: Cardiovascular;  Laterality: Left;  . A/V SHUNT INTERVENTION Left 03/02/2019   Procedure: A/V SHUNT INTERVENTION;  Surgeon: Algernon Huxley, MD;  Location: [place] [facility];  Service: Cardiovascular;  Laterality: Left;  . AORTA - FEMORAL ARTERY BYPASS GRAFT    . AV FISTULA PLACEMENT Left 12/26/2018   Procedure: INSERTION OF GORE STRETCH VASCULAR 4-7MM X  45CM IN LEFT UPPER ARM;  Surgeon: Marty Heck, MD;   Location: [place];  Service: Vascular;  Laterality: Left;  . AV FISTULA PLACEMENT Right 08/12/2019   Procedure: INSERTION OF ARTERIOVENOUS (AV) GORE-TEX GRAFT ARM;  Surgeon: Algernon Huxley, MD;  Location: [facility];  Service: Vascular;  Laterality: Right;  . DIALYSIS/PERMA CATHETER REMOVAL N/A 02/06/2019   Procedure: DIALYSIS/PERMA CATHETER REMOVAL;  Surgeon: Algernon Huxley, MD;  Location: [place] [facility];  Service: Cardiovascular;  Laterality: N/A;  . ESOPHAGOGASTRODUODENOSCOPY N/A 10/07/2019   Procedure: ESOPHAGOGASTRODUODENOSCOPY (EGD);  Surgeon: Lin Landsman, MD;  Location[facility];  Service: Gastroenterology;  Laterality: N/A;  . LEFT HEART CATH AND CORONARY ANGIOGRAPHY Right 07/10/2019   Procedure: LEFT HEART CATH AND CORONARY ANGIOGRAPHY;  Surgeon: Dionisio David, MD;  Location: [place] [facility];  Service: Cardiovascular;  Laterality: Right;  . PERIPHERAL VASCULAR THROMBECTOMY Left 07/28/2019   Procedure: Left Upper Extremity Dialysis Access [place];  Surgeon: Katha Cabal, MD;  Location: [place] [facility];  Service: Cardiovascular;  Laterality: Left;  . REMOVAL OF A DIALYSIS CATHETER Right 08/26/2019   Procedure: REMOVAL OF A DIALYSIS CATHETER;  Surgeon: Algernon Huxley, MD;  Location: [facility];  Service: Vascular;  Laterality: Right;  . TEMPORARY DIALYSIS CATHETER N/A 08/25/2019   Procedure: TEMPORARY DIALYSIS CATHETER;  Surgeon: Katha Cabal, MD;  Location: [place] [facility];  Service: Cardiovascular;  Laterality: N/A;  . UPPER EXTREMITY ANGIOGRAPHY Left 08/13/2019   Procedure: UPPER EXTREMITY ANGIOGRAPHy;  Surgeon: Algernon Huxley, MD;  Location: [place] [facility];  Service: Cardiovascular;  Laterality: Left;    Medications Prior to  Admission  Medication Sig Dispense Refill Last Dose  . amLODipine (NORVASC) 5 MG tablet Take 1 tablet (5 mg total) by mouth daily. 30 tablet 3   . apixaban (ELIQUIS) 2.5 MG TABS tablet Take 1 tablet (2.5 mg total) by mouth 2 (two)  times daily. 60 tablet 1   . atorvastatin (LIPITOR) 80 MG tablet Take 1 tablet (80 mg total) by mouth at bedtime. 30 tablet 1   . b complex-C-folic acid 1 MG capsule Take 1 capsule by mouth daily after supper.      . carvedilol (COREG) 25 MG tablet Take 1 tablet (25 mg total) by mouth 2 (two) times daily with a meal. 60 tablet 1   . carvedilol (COREG) 25 MG tablet Take 1 tablet (25 mg total) by mouth 2 (two) times daily with a meal. 60 tablet 0   . gabapentin (NEURONTIN) 100 MG capsule Take 1 capsule (100 mg total) by mouth 3 (three) times daily. 90 capsule 1   . hydrALAZINE (APRESOLINE) 25 MG tablet Take 1 tablet (25 mg total) by mouth every 8 (eight) hours. 90 tablet 1   . hydrOXYzine (ATARAX/VISTARIL) 25 MG tablet Take 25 mg by mouth 3 (three) times daily as needed for anxiety.     . levETIRAcetam (KEPPRA) 500 MG tablet Take 1 tablet (500 mg total) by mouth 2 (two) times daily. 60 tablet 0   . loratadine (CLARITIN) 10 MG tablet Take 1 tablet (10 mg total) by mouth daily. 30 tablet 0   . multivitamin (RENA-VIT) TABS tablet Take 1 tablet by mouth daily. 30 each 1   . neomycin-bacitracin-polymyxin (NEOSPORIN) OINT Apply 1 application topically 2 (two) times daily. (Patient taking differently: Apply 1 application topically as needed. ) 56 g 1   . Nutritional Supplements (FEEDING SUPPLEMENT, NEPRO CARB STEADY,) LIQD Take 237 mLs by mouth 2 (two) times daily between meals. 237 mL 30   . sevelamer carbonate (RENVELA) 800 MG tablet Take 2 tablets (1,600 mg total) by mouth 3 (three) times daily with meals. 180 tablet 1    Social History   Socioeconomic History  . Marital status: Single    Spouse name: Not on file  . Number of children: Not on file  . Years of education: Not on file  . Highest education level: Not on file  Occupational History  . Not on file  Tobacco Use  . Smoking status: Current Every Day Smoker    Packs/day: 0.50    Years: 30.00    Pack years: 15.00    Types: Cigarettes     Last attempt to quit: 06/26/2018    Years since quitting: 1.3  . Smokeless tobacco: Never Used  . Tobacco comment: smoked for 30 years   Substance and Sexual Activity  . Alcohol use: Not Currently  . Drug use: Yes    Frequency: 1.0 times per week    Types: Marijuana    Comment: Pt states "maybe once or twice a week".   . Sexual activity: Not on file  Other Topics Concern  . Not on file  Social History Narrative  . Not on file   Social Determinants of Health   Financial Resource Strain:   . Difficulty of Paying Living Expenses: Not on file  Food Insecurity:   . Worried About Charity fundraiser in the Last Year: Not on file  . Ran Out of Food in the Last Year: Not on file  Transportation Needs:   . Lack of Transportation (Medical): Not  on file  . Lack of Transportation (Non-Medical): Not on file  Physical Activity:   . Days of Exercise per Week: Not on file  . Minutes of Exercise per Session: Not on file  Stress:   . Feeling of Stress : Not on file  Social Connections:   . Frequency of Communication with Friends and Family: Not on file  . Frequency of Social Gatherings with Friends and Family: Not on file  . Attends Religious Services: Not on file  . Active Member of Clubs or Organizations: Not on file  . Attends Archivist Meetings: Not on file  . Marital Status: Not on file  Intimate Partner Violence: Unknown  . Fear of Current or Ex-Partner: Patient refused  . Emotionally Abused: Patient refused  . Physically Abused: Patient refused  . Sexually Abused: Patient refused    Family History  Problem Relation Age of Onset  . Hypertension Other   . Diabetes Other   . Clotting disorder Father       Review of systems complete and found to be negative unless listed above      PHYSICAL EXAM  General: Well developed, well nourished, in no acute distress HEENT:  Normocephalic and atramatic Neck:  No JVD.  Lungs: Clear bilaterally to auscultation and  percussion. Heart: HRRR . Normal S1 and S2 without gallops or murmurs.  Abdomen: Bowel sounds are positive, abdomen soft and non-tender  Msk:  Back normal, normal gait. Normal strength and tone for age. Extremities: No clubbing, cyanosis or edema.   Neuro: Alert and oriented X 3. Psych:  Good affect, responds appropriately  Labs:   Lab Results  Component Value Date   WBC 14.3 (H) 10/27/2019   HGB 9.0 (L) 10/27/2019   HCT 31.5 (L) 10/27/2019   MCV 100.0 10/27/2019   PLT 71 (L) 10/27/2019    Recent Labs  Lab 10/27/19 1823  NA 131*  K 4.8  CL 99  CO2 11*  BUN 60*  CREATININE 5.18*  CALCIUM >15.0*  PROT 5.0*  BILITOT 1.9*  ALKPHOS 63  ALT 14  AST 29  GLUCOSE 104*   Lab Results  Component Value Date   CKTOTAL 33 (L) 10/03/2019   TROPONINI 0.07 (HH) 03/01/2019    Lab Results  Component Value Date   CHOL 112 07/10/2019   CHOL 101 01/08/2019   Lab Results  Component Value Date   HDL 43 07/10/2019   HDL 33 (L) 01/08/2019   Lab Results  Component Value Date   LDLCALC 55 07/10/2019   LDLCALC 37 01/08/2019   Lab Results  Component Value Date   TRIG 62 08/05/2019   TRIG 71 07/10/2019   TRIG 239 (H) 02/06/2019   Lab Results  Component Value Date   CHOLHDL 2.6 07/10/2019   CHOLHDL 3.1 01/08/2019   No results found for: LDLDIRECT    Radiology: DG Chest 1 View  Result Date: 10/27/2019 CLINICAL DATA:  Difficult intubation status post CPR EXAM: CHEST  1 VIEW COMPARISON:  October 27, 2019 at 12:19 p.m. FINDINGS: The heart size remains enlarged. There is a small to moderate-sized left-sided pleural effusion. There is a small right-sided pleural effusion. There is diffuse increased attenuation within the left lung field. Prominent interstitial lung markings are noted. There is a stable tunneled dialysis catheter on the right. The endotracheal tube terminates approximately 3 cm above the carina. There is no pneumothorax. IMPRESSION: 1. Lines and tubes as above. 2.  Persistent bilateral pleural effusions, left greater than  right. 3. Worsening left-sided airspace disease concerning for pneumonia. Electronically Signed   By: Constance Holster M.D.   On: 10/27/2019 20:07   DG Chest 2 View  Result Date: 10/27/2019 CLINICAL DATA:  44 year old male with history of chest pain. Vomiting for the past 2 weeks. Diarrhea since yesterday. EXAM: CHEST - 2 VIEW COMPARISON:  Chest x-ray 10/03/2019. FINDINGS: Right internal jugular PermCath with tips terminating in the right atrium and superior cavoatrial junction. Left upper extremity vascular stent extending into the axillary region. Lung volumes are slightly low. Opacity at the left base may reflect atelectasis and/or consolidation. Small to moderate left pleural effusion, slightly decreased compared to the prior study. Right lung is clear. No right pleural effusion. No pneumothorax. No definite suspicious appearing pulmonary nodules or masses. No evidence of pulmonary edema. Mild cardiomegaly. Upper mediastinal contours are within normal limits. IMPRESSION: 1. Support apparatus and postoperative changes, as above. 2. Left lower lobe atelectasis and/or consolidation with superimposed small to moderate left pleural effusion, slightly decreased compared to the prior study. Electronically Signed   By: Vinnie Langton M.D.   On: 10/27/2019 12:36   DG Chest 2 View  Result Date: 10/03/2019 CLINICAL DATA:  Chest pressure since yesterday. States has had cough for one week. Was tested for COVID yesterday but does not have result. DIalysis patient with permacath R upper chest. Hx - ESRD, HTN, AAA, current smoker 0.5 ppd. EXAM: CHEST - 2 VIEW COMPARISON:  08/25/2019.  CT, 07/20/2019. FINDINGS: Cardiac silhouette is mildly enlarged. No mediastinal or hilar masses. Right internal jugular tunneled dual lumen central venous catheter is stable, distal tip in the right atrium. Moderate left pleural effusion associated with dependent left lung base  opacity, the latter most likely atelectasis although pneumonia is possible. Lungs show prominent bronchovascular markings bilaterally, but are otherwise clear. No pulmonary edema. No pneumothorax. Skeletal structures are grossly intact. Stable left axillary to upper extremity vascular stent. IMPRESSION: 1. Moderate right pleural effusion, which appears decreased compared to the prior chest radiograph. 2. Left lung base opacity, which is most likely atelectasis. Pneumonia is not excluded but felt less likely. 3. No evidence of pulmonary edema.  Mild cardiomegaly. Electronically Signed   By: Lajean Manes M.D.   On: 10/03/2019 12:58   DG Ankle Complete Right  Result Date: 10/27/2019 CLINICAL DATA:  Chronic wound with cellulitis EXAM: RIGHT ANKLE - COMPLETE 3+ VIEW COMPARISON:  None. FINDINGS: Frontal, oblique, and lateral views obtained. There is evidence of old trauma involving the calcaneus with remodeling. No acute fracture or joint effusion is evident. There is narrowing of the posterior aspect of the ankle joint. There is arthropathy throughout the subtalar joint regions. No frank erosion or bony destruction evident. IMPRESSION: Evidence of old trauma involving the calcaneus. Arthropathy throughout the subtalar joints. There is narrowing of the posterior ankle joint. No frank bony destruction appreciated by radiography. No acute fracture or joint effusion evident. If there remains concern for osteomyelitis, MR or nuclear medicine three-phase bone scan could be helpful for further assessment. Electronically Signed   By: Lowella Grip III M.D.   On: 10/27/2019 13:09   DG Abd 1 View  Result Date: 10/04/2019 CLINICAL DATA:  NG tube placement EXAM: ABDOMEN - 1 VIEW COMPARISON:  10/04/2019 FINDINGS: Interval placement of esophagogastric tube, tip near the gastroesophageal junction and side port above the diaphragm. Recommend advancement to ensure subdiaphragmatic positioning of tip and side port.  Nonobstructive pattern of bowel gas in the included abdomen. No obvious free  air. Stent projects over the central abdomen. Cardiomegaly. IMPRESSION: Interval placement of esophagogastric tube, tip near the gastroesophageal junction and side port above the diaphragm. Recommend advancement to ensure subdiaphragmatic positioning of tip and side port. Electronically Signed   By: Eddie Candle M.D.   On: 10/04/2019 17:10   DG Abd 2 Views  Result Date: 10/04/2019 CLINICAL DATA:  Vomiting. EXAM: ABDOMEN - 2 VIEW COMPARISON:  08/25/2019 FINDINGS: There is no bowel dilation to suggest obstruction. No air-fluid levels or free air. Vascular stent overlies the upper to mid lumbar spine. A right common iliac stent is present. These are stable from prior study. There are surgical vascular clips in the central abdomen and left lower quadrant. These are also unchanged. No evidence of renal or ureteral stones. There is opacity at the left lung base, incompletely imaged. Skeletal structures unremarkable. IMPRESSION: 1. No evidence of bowel obstruction or free air.  No acute findings. Electronically Signed   By: Lajean Manes M.D.   On: 10/04/2019 16:11    EKG: Sinus tachycardia nonspecific ST-T wave changes  ASSESSMENT AND PLAN:  Status post cardiac arrest Acute respiratory failure Renal failure on dialysis Sepsis hypovolemic shock Elevated troponin Altered mental status  . Plan Agree with ICU level care Respiratory support with ventilatory management Continue dialysis therapy Follow-up EKGs troponins Correct electrolytes Agree with broad-spectrum antibiotic therapy Echocardiogram for further assessment evaluation Continue critical care management    Signed: Yolonda Kida MD, PHD, [facility] 10/27/2019, 9:12 PM

## 2019-10-27 NOTE — Progress Notes (Signed)
Attempt was made to place emergent Left Femoral CVC due to pt requiring multiple vasopressors and limited venous access.  Able to successfully cannulate the left femoral vein, however unable to thread guidewire.  Attempt was aborted.  Pressure held at the site for 15 minutes.  No signs of complications, hematoma, or bleeding.  Will attempt to access right femoral site as pt with Right IJ hemodialysis access.    Darel Hong, AGACNP-BC [facility] Pager: [phone]

## 2019-10-27 NOTE — H&P (Signed)
Name: Brett Small MRN: [id number] DOB: 05-Mar-1976     CONSULTATION DATE: 10/27/2019  REFERRING MD : griffith  CHIEF COMPLAINT: cardiac arrest     HISTORY OF PRESENT ILLNESS:  44 y.o. male with below list of previous medical conditions presents to the emergency department secondary to central chest discomfort, productive cough with "yellow-green sputum shortness of breath x3 days.  Patient also admits to diarrhea.  Patient denies any nausea or vomiting.  Patient also admits to worsening right ankle pain and redness  He had missed HD as well Patient with elevated potassium,   This RN went in to get pt off the bedpan when pt stated that he could not breathe. This RN placed 2L BNC on the pt and called CN for assistance. Pt then began to agonal breathe and a code was called.    First CODE 11 minutes CPR ACLS protocol started Second CODE 10 minutes  Patient with recurrent cardiac arrest and is NOT a candidate for hypothermia Protocol  PAST MEDICAL HISTORY :   has a past medical history of ESRD (end stage renal disease) ([place]), Hypertension, PAD (peripheral artery disease) ([place]), Peripheral vascular disease ([place]), Renal disorder, and Secondary hyperparathyroidism of renal origin ([place]).  has a past surgical history that includes Aorta - femoral artery bypass graft; AV fistula placement (Left, 12/26/2018); A/V SHUNT INTERVENTION (Left, 01/19/2019); DIALYSIS/PERMA CATHETER REMOVAL (N/A, 02/06/2019); A/V SHUNT INTERVENTION (Left, 03/02/2019); LEFT HEART CATH AND CORONARY ANGIOGRAPHY (Right, 07/10/2019); PERIPHERAL VASCULAR THROMBECTOMY (Left, 07/28/2019); Upper Extremity Angiography (Left, 08/13/2019); AV fistula placement (Right, 08/12/2019); TEMPORARY DIALYSIS CATHETER (N/A, 08/25/2019); Removal of a dialysis catheter (Right, 08/26/2019); and Esophagogastroduodenoscopy (N/A, 10/07/2019). Prior to Admission medications   Medication Sig Start Date End Date Taking? Authorizing Provider  amLODipine  (NORVASC) 5 MG tablet Take 1 tablet (5 mg total) by mouth daily. 08/28/19   Danford, Suann Larry, MD  apixaban (ELIQUIS) 2.5 MG TABS tablet Take 1 tablet (2.5 mg total) by mouth 2 (two) times daily. 01/26/19   Clapacs, Madie Reno, MD  atorvastatin (LIPITOR) 80 MG tablet Take 1 tablet (80 mg total) by mouth at bedtime. 01/26/19   Clapacs, Madie Reno, MD  b complex-C-folic acid 1 MG capsule Take 1 capsule by mouth daily after supper.     [provider]  carvedilol (COREG) 25 MG tablet Take 1 tablet (25 mg total) by mouth 2 (two) times daily with a meal. 01/26/19   Clapacs, Madie Reno, MD  carvedilol (COREG) 25 MG tablet Take 1 tablet (25 mg total) by mouth 2 (two) times daily with a meal. 10/09/19 11/08/19  Sreenath, Sudheer B, MD  gabapentin (NEURONTIN) 100 MG capsule Take 1 capsule (100 mg total) by mouth 3 (three) times daily. 01/26/19   Clapacs, Madie Reno, MD  hydrALAZINE (APRESOLINE) 25 MG tablet Take 1 tablet (25 mg total) by mouth every 8 (eight) hours. 01/26/19   Clapacs, Madie Reno, MD  hydrOXYzine (ATARAX/VISTARIL) 25 MG tablet Take 25 mg by mouth 3 (three) times daily as needed for anxiety.    [provider]  levETIRAcetam (KEPPRA) 500 MG tablet Take 1 tablet (500 mg total) by mouth 2 (two) times daily. 08/17/19   Mayo, Pete Pelt, MD  loratadine (CLARITIN) 10 MG tablet Take 1 tablet (10 mg total) by mouth daily. 03/04/19   Salary, Holly Bodily D, MD  multivitamin (RENA-VIT) TABS tablet Take 1 tablet by mouth daily. 01/27/19   Clapacs, Madie Reno, MD  neomycin-bacitracin-polymyxin (NEOSPORIN) OINT Apply 1 application topically 2 (two) times daily.  Patient taking differently: Apply 1 application topically as needed.  01/26/19   Clapacs, Madie Reno, MD  Nutritional Supplements (FEEDING SUPPLEMENT, NEPRO CARB STEADY,) LIQD Take 237 mLs by mouth 2 (two) times daily between meals. 07/06/19   Epifanio Lesches, MD  sevelamer carbonate (RENVELA) 800 MG tablet Take 2 tablets (1,600 mg total) by mouth 3 (three) times daily  with meals. 01/26/19   Clapacs, Madie Reno, MD   Allergies  Allergen Reactions  . Codeine Nausea Only    Patient questioned this (??)  . Sulfa Antibiotics Hives and Nausea And Vomiting    FAMILY HISTORY:  family history includes Clotting disorder in his father; Diabetes in an other family member; Hypertension in an other family member. SOCIAL HISTORY:  reports that he has been smoking cigarettes. He has a 15.00 pack-year smoking history. He has never used smokeless tobacco. He reports previous alcohol use. He reports current drug use. Frequency: 1.00 time per week. Drug: Marijuana.  REVIEW OF SYSTEMS:   Unable to obtain due to critical illness   VITAL SIGNS: Temp:  [98.2 F (36.8 C)] 98.2 F (36.8 C) (01/05 1344) Pulse Rate:  [114-127] 127 (01/05 1748) Resp:  [18-26] 26 (01/05 1748) BP: (133)/(91) 133/91 (01/05 1344) SpO2:  [98 %] 98 % (01/05 1344) FiO2 (%):  [100 %] 100 % (01/05 1545) Weight:  [46.8 kg] 46.8 kg (01/05 1209)   No intake/output data recorded. No intake/output data recorded.   SpO2: 98 % FiO2 (%): 100 %   Physical Examination:  GENERAL:critically ill appearing, +resp distress HEAD: Normocephalic, atraumatic.  EYES: Pupils equal, round, reactive to light.  No scleral icterus.  MOUTH: Moist mucosal membrane. NECK: Supple. No JVD.  PULMONARY: +rhonchi, +wheezing CARDIOVASCULAR: S1 and S2. Regular rate and rhythm. No murmurs, rubs, or gallops.  GASTROINTESTINAL: Soft, nontender, -distended.  Positive bowel sounds.  MUSCULOSKELETAL: No swelling, clubbing, or edema.  NEUROLOGIC: obtunded SKIN:intact,warm,dry  I personally reviewed lab work that was obtained in last 24 hrs. CXR Independently reviewed-left sided opacity likely pneumonia/effusion  MEDICATIONS: I have reviewed all medications and confirmed regimen as documented   CULTURE RESULTS   No results found for this or any previous visit (from the past 240 hour(s)).        IMAGING    DG Chest 2  View  Result Date: 10/27/2019 CLINICAL DATA:  44 year old male with history of chest pain. Vomiting for the past 2 weeks. Diarrhea since yesterday. EXAM: CHEST - 2 VIEW COMPARISON:  Chest x-ray 10/03/2019. FINDINGS: Right internal jugular PermCath with tips terminating in the right atrium and superior cavoatrial junction. Left upper extremity vascular stent extending into the axillary region. Lung volumes are slightly low. Opacity at the left base may reflect atelectasis and/or consolidation. Small to moderate left pleural effusion, slightly decreased compared to the prior study. Right lung is clear. No right pleural effusion. No pneumothorax. No definite suspicious appearing pulmonary nodules or masses. No evidence of pulmonary edema. Mild cardiomegaly. Upper mediastinal contours are within normal limits. IMPRESSION: 1. Support apparatus and postoperative changes, as above. 2. Left lower lobe atelectasis and/or consolidation with superimposed small to moderate left pleural effusion, slightly decreased compared to the prior study. Electronically Signed   By: Vinnie Langton M.D.   On: 10/27/2019 12:36   DG Ankle Complete Right  Result Date: 10/27/2019 CLINICAL DATA:  Chronic wound with cellulitis EXAM: RIGHT ANKLE - COMPLETE 3+ VIEW COMPARISON:  None. FINDINGS: Frontal, oblique, and lateral views obtained. There is evidence of old trauma involving  the calcaneus with remodeling. No acute fracture or joint effusion is evident. There is narrowing of the posterior aspect of the ankle joint. There is arthropathy throughout the subtalar joint regions. No frank erosion or bony destruction evident. IMPRESSION: Evidence of old trauma involving the calcaneus. Arthropathy throughout the subtalar joints. There is narrowing of the posterior ankle joint. No frank bony destruction appreciated by radiography. No acute fracture or joint effusion evident. If there remains concern for osteomyelitis, MR or nuclear medicine  three-phase bone scan could be helpful for further assessment. Electronically Signed   By: Lowella Grip III M.D.   On: 10/27/2019 13:09        Indwelling Urinary Catheter continued, requirement due to   Reason to continue Indwelling Urinary Catheter strict Intake/Output monitoring for hemodynamic instability         Ventilator continued, requirement due to severe respiratory failure   Ventilator Sedation RASS 0 to -2      ASSESSMENT AND PLAN SYNOPSIS   Severe ACUTE Hypoxic and Hypercapnic Respiratory Failure from acute cardiac arrest with acute pneumonia and septic and cardiogenic shock PATIENT WITH RECURRENT CARDIAC CODE BLUE'S HE IS NOT A CANDIDATE FOR HYPOTHERMIA PROTOCOL -continue Full MV support -continue Bronchodilator Therapy -Wean Fio2 and PEEP as tolerated   ACUTE  CARDIAC FAILURE- High risk for death   KIDNEY INJURY/Renal Failure ESRD  -follow chem 7 -follow UO -continue Foley Catheter-assess need -Avoid nephrotoxic agents   NEUROLOGY comatosed Poor prognosis    SHOCK-SEPSIS/HYPOVOLUMIC/CARDIOGENIC -use vasopressors to keep MAP>65 -follow ABG and LA -follow up cultures -emperic ABX -consider stress dose steroids -aggressive IV fluid resuscitation  CARDIAC ICU monitoring  ID -continue IV abx as prescibed -follow up cultures  GI GI PROPHYLAXIS as indicated  NUTRITIONAL STATUS DIET-->NPO Constipation protocol as indicated   ENDO - will use ICU hypoglycemic\Hyperglycemia protocol if needed    ELECTROLYTES -follow labs as needed -replace as needed -pharmacy consultation and following   DVT/GI PRX ordered TRANSFUSIONS AS NEEDED MONITOR FSBS ASSESS the need for LABS    Critical Care Time devoted to patient care services described in this note is 45 minutes.   Overall, patient is critically ill, prognosis is guarded.  Patient with Multiorgan failure and at high risk for cardiac arrest and death.   Patient with very poor  chance of meaningful recovery   Corrin Parker, M.D.  [facility]  Medical Director [place] [facility]io-Pulmonary Department

## 2019-10-27 NOTE — ED Notes (Signed)
1 amp epi given at this time.

## 2019-10-27 NOTE — ED Notes (Signed)
CPR started at 1818 Epi given at 1818 Calcium Gluconate given 1819 CPR stopped 1822

## 2019-10-27 NOTE — ED Notes (Signed)
1 gm calcium given at this time.

## 2019-10-27 NOTE — Procedures (Signed)
Arterial Catheter Insertion Procedure Note Brett Small [id number] 1976-03-16  Procedure: Insertion of Arterial Catheter  Indications: Blood pressure monitoring and Frequent blood sampling  Procedure Details Consent: Unable to obtain consent because of emergent medical necessity. Time Out: Verified patient identification, verified procedure, site/side was marked, verified correct patient position, special equipment/implants available, medications/allergies/relevent history reviewed, required imaging and test results available.  Performed  Maximum sterile technique was used including antiseptics, cap, gloves, gown, hand hygiene, mask and sheet. Skin prep: Chlorhexidine; local anesthetic administered 20 gauge catheter was inserted into right femoral artery using the Seldinger technique. ULTRASOUND GUIDANCE USED: YES Evaluation Blood flow good; BP tracing good. Complications: No apparent complications.    Procedure was performed using Ultrasound for direct visualization of cannulization of Right Femoral Artery.       Brett Small, AGACNP-BC [facility] Pager: [phone]  Brett Small 10/27/2019

## 2019-10-27 NOTE — Progress Notes (Signed)
CODE SEPSIS - PHARMACY COMMUNICATION  **Broad Spectrum Antibiotics should be administered within 1 hour of Sepsis diagnosis**  Time Code Sepsis Called/Page Received: 1241  Antibiotics Ordered: vanc/cefepime/Flagyl  Time of 1st antibiotic administration: 1327  Additional action taken by pharmacy: NA  If necessary, Name of Provider/Nurse Contacted: NA    Tawnya Crook ,PharmD Clinical Pharmacist  10/27/2019  2:14 PM

## 2019-10-27 NOTE — Progress Notes (Signed)
Patient very well known to [place], pt had cardiac arrest earlier.  With severe acidosis now.  Will plan to initiate on CRRT with dialysate of 1000cc/hr, prefilter replacement 400cc/hr, and post filter replacement of 400cc/hr.  No net fluid removal at this time given severe hypotension.  Overall prognosis quite guarded.

## 2019-10-27 NOTE — Sepsis Progress Note (Signed)
Notified bedside nurse of need to second draw blood cultures. Pt is reported difficult stick. One blood cx obtained before abx hung.

## 2019-10-27 NOTE — ED Notes (Signed)
This RN attempted to obtain 2nd BC 2x w/osucess. This Rn requesting lab to obtain [facility]. This spoke with [employer]

## 2019-10-27 NOTE — ED Notes (Signed)
EKG done at this time

## 2019-10-27 NOTE — Procedures (Signed)
Central Venous Catheter Insertion Procedure Note Brett Small [id number] January 27, 1976  Procedure: Insertion of Central Venous Catheter Indications: Assessment of intravascular volume, Drug and/or fluid administration and Frequent blood sampling  Procedure Details Consent: Unable to obtain consent because of emergent medical necessity. Time Out: Verified patient identification, verified procedure, site/side was marked, verified correct patient position, special equipment/implants available, medications/allergies/relevent history reviewed, required imaging and test results available.  Performed  Maximum sterile technique was used including antiseptics, cap, gloves, gown, hand hygiene, mask and sheet. Skin prep: Chlorhexidine; local anesthetic administered A antimicrobial bonded/coated triple lumen catheter was placed in the right femoral vein due to patient being a dialysis patient using the Seldinger technique.  Evaluation Blood flow good Complications: No apparent complications Patient did tolerate procedure well. Chest X-ray ordered to verify placement.  CXR: Not needed, placed in right femoral vein.   Procedure was performed using Ultrasound for direct visualization of cannulization of right femoral vein.    Brett Small, AGACNP-BC [name] Pager: [phone]  [name] 10/27/2019, 9:42 PM

## 2019-10-27 NOTE — Sepsis Progress Note (Addendum)
Notified bedside nurse of need to draw repeat Lactic acid

## 2019-10-27 NOTE — ED Notes (Signed)
1 gm calcium given at this time Attempted intubation at this time by Dr. Charna Archer. 7.5 tube at 25 at the lip. CPR continues

## 2019-10-27 NOTE — Sepsis Progress Note (Addendum)
Notified provider of need to order fluid bolus. Pt is ESRD on HD

## 2019-10-27 NOTE — Progress Notes (Signed)
PHARMACY -  BRIEF ANTIBIOTIC NOTE   Pharmacy has received consult(s) for cefepime and vancomycin from an ED provider.  The patient's profile has been reviewed for ht/wt/allergies/indication/available labs.    One time order(s) placed for cefepime 2 g + vanc 1 g  Further antibiotics/pharmacy consults should be ordered by admitting physician if indicated.                       Thank you,  Tawnya Crook, PharmD 10/27/2019  12:56 PM

## 2019-10-27 NOTE — ED Notes (Signed)
1 amp EPI given at this time.

## 2019-10-27 NOTE — ED Provider Notes (Signed)
[facility] Department of Emergency Medicine   Code Blue CONSULT NOTE  Chief Complaint: Cardiac arrest/unresponsive   Level V Caveat: Unresponsive  History of present illness: I was contacted by the hospital for a CODE BLUE cardiac arrest and presented to the patient's bedside.   Patient physically present in the ED, but had previously been admitted to the hospitalist service and awaiting bed availability upstairs.  ROS: Unable to obtain, Level V caveat  Scheduled Meds: . patiromer  8.4 g Oral Daily   Continuous Infusions: . azithromycin 500 mg (10/27/19 1655)  . calcium gluconate    . [START ON 10/28/2019] ceFEPime (MAXIPIME) IV    . [START ON 10/29/2019] vancomycin     PRN Meds:.acetaminophen **OR** acetaminophen, bisacodyl, magnesium citrate, ondansetron **OR** ondansetron (ZOFRAN) IV, polyethylene glycol, traMADol Past Medical History:  Diagnosis Date  . ESRD (end stage renal disease) ([place])   . Hypertension   . PAD (peripheral artery disease) ([facility])    Required aortofemoral stent-which had closed and had to redo the procedure and ischemia of limb.  . Peripheral vascular disease ([place])   . Renal disorder   . Secondary hyperparathyroidism of renal origin [facility])    Past Surgical History:  Procedure Laterality Date  . A/V SHUNT INTERVENTION Left 01/19/2019   Procedure: LEFT UPPER EXTREMITY A/V SHUNTOGRAM / UPPER EXTREMITY ANGIOGRAM;  Surgeon: Algernon Huxley, MD;  Location: [place] [facility];  Service: Cardiovascular;  Laterality: Left;  . A/V SHUNT INTERVENTION Left 03/02/2019   Procedure: A/V SHUNT INTERVENTION;  Surgeon: Algernon Huxley, MD;  Location: [place] [facility];  Service: Cardiovascular;  Laterality: Left;  . AORTA - FEMORAL ARTERY BYPASS GRAFT    . AV FISTULA PLACEMENT Left 12/26/2018   Procedure: INSERTION OF GORE STRETCH VASCULAR 4-7MM X  45CM IN LEFT UPPER ARM;  Surgeon: Marty Heck, MD;  Location: [place];  Service: Vascular;  Laterality:  Left;  . AV FISTULA PLACEMENT Right 08/12/2019   Procedure: INSERTION OF ARTERIOVENOUS (AV) GORE-TEX GRAFT ARM;  Surgeon: Algernon Huxley, MD;  Location: [facility] ORS;  Service: Vascular;  Laterality: Right;  . DIALYSIS/PERMA CATHETER REMOVAL N/A 02/06/2019   Procedure: DIALYSIS/PERMA CATHETER REMOVAL;  Surgeon: Algernon Huxley, MD;  Location: [place] [facility];  Service: Cardiovascular;  Laterality: N/A;  . ESOPHAGOGASTRODUODENOSCOPY N/A 10/07/2019   Procedure: ESOPHAGOGASTRODUODENOSCOPY (EGD);  Surgeon: Lin Landsman, MD;  Location: [facility] ENDOSCOPY;  Service: Gastroenterology;  Laterality: N/A;  . LEFT HEART CATH AND CORONARY ANGIOGRAPHY Right 07/10/2019   Procedure: LEFT HEART CATH AND CORONARY ANGIOGRAPHY;  Surgeon: Dionisio David, MD;  Location: [place] CV [facility];  Service: Cardiovascular;  Laterality: Right;  . PERIPHERAL VASCULAR THROMBECTOMY Left 07/28/2019   Procedure: Left Upper Extremity Dialysis Access Declot;  Surgeon: Katha Cabal, MD;  Location: [place] [facility];  Service: Cardiovascular;  Laterality: Left;  . REMOVAL OF A DIALYSIS CATHETER Right 08/26/2019   Procedure: REMOVAL OF A DIALYSIS CATHETER;  Surgeon: Algernon Huxley, MD;  Location: [facility] ORS;  Service: Vascular;  Laterality: Right;  . TEMPORARY DIALYSIS CATHETER N/A 08/25/2019   Procedure: TEMPORARY DIALYSIS CATHETER;  Surgeon: Katha Cabal, MD;  Location: [place] CV LAB;  Service: Cardiovascular;  Laterality: N/A;  . UPPER EXTREMITY ANGIOGRAPHY Left 08/13/2019   Procedure: UPPER EXTREMITY ANGIOGRAPHy;  Surgeon: Algernon Huxley, MD;  Location: [place] [facility];  Service: Cardiovascular;  Laterality: Left;   Social History   Socioeconomic History  . Marital status: Single  Spouse name: Not on file  . Number of children: Not on file  . Years of education: Not on file  . Highest education level: Not on file  Occupational History  . Not on file  Tobacco Use  . Smoking status: Current Every Day  Smoker    Packs/day: 0.50    Years: 30.00    Pack years: 15.00    Types: Cigarettes    Last attempt to quit: 06/26/2018    Years since quitting: 1.3  . Smokeless tobacco: Never Used  . Tobacco comment: smoked for 30 years   Substance and Sexual Activity  . Alcohol use: Not Currently  . Drug use: Yes    Frequency: 1.0 times per week    Types: Marijuana    Comment: Pt states "maybe once or twice a week".   . Sexual activity: Not on file  Other Topics Concern  . Not on file  Social History Narrative  . Not on file   Social Determinants of Health   Financial Resource Strain:   . Difficulty of Paying Living Expenses: Not on file  Food Insecurity:   . Worried About Charity fundraiser in the Last Year: Not on file  . Ran Out of Food in the Last Year: Not on file  Transportation Needs:   . Lack of Transportation (Medical): Not on file  . Lack of Transportation (Non-Medical): Not on file  Physical Activity:   . Days of Exercise per Week: Not on file  . Minutes of Exercise per Session: Not on file  Stress:   . Feeling of Stress : Not on file  Social Connections:   . Frequency of Communication with Friends and Family: Not on file  . Frequency of Social Gatherings with Friends and Family: Not on file  . Attends Religious Services: Not on file  . Active Member of Clubs or Organizations: Not on file  . Attends Archivist Meetings: Not on file  . Marital Status: Not on file  Intimate Partner Violence: Unknown  . Fear of Current or Ex-Partner: Patient refused  . Emotionally Abused: Patient refused  . Physically Abused: Patient refused  . Sexually Abused: Patient refused   Allergies  Allergen Reactions  . Codeine Nausea Only    Patient questioned this (??)  . Sulfa Antibiotics Hives and Nausea And Vomiting    Last set of Vital Signs (not current) Vitals:   10/27/19 1344  BP: (!) 133/91  Pulse: (!) 114  Resp: 18  Temp: 98.2 F (36.8 C)  SpO2: 98%       Physical Exam  Gen: unresponsive Cardiovascular: pulseless  Resp: apneic. Breath sounds equal bilaterally with bagging  Abd: nondistended  Neuro: GCS 3, unresponsive to pain  HEENT: No blood in posterior pharynx, gag reflex absent  Neck: No crepitus  Musculoskeletal: No deformity  Skin: warm  Procedures (when applicable, including Critical Care time): Procedure Name: Intubation Date/Time: 10/27/2019 11:28 PM Performed by: Blake Divine, MD Pre-anesthesia Checklist: Patient identified, Patient being monitored, Emergency Drugs available, Timeout performed and Suction available Oxygen Delivery Method: Non-rebreather mask Preoxygenation: Pre-oxygenation with 100% oxygen Induction Type: Rapid sequence Ventilation: Mask ventilation without difficulty Laryngoscope Size: Glidescope and 4 Grade View: Grade I Tube size: 7.5 mm Number of attempts: 1 Airway Equipment and Method: Video-laryngoscopy Placement Confirmation: ETT inserted through vocal cords under direct vision,  CO2 detector and Breath sounds checked- equal and bilateral Secured at: 25 cm Tube secured with: ETT holder Dental Injury: Teeth and  Oropharynx as per pre-operative assessment         MDM / Assessment and Plan Notified by nursing that patient developed agonal respirations and decreasing heart rate while positioning on the bedpan.  He initially had a thready pulse but pulse quickly became no longer palpable.  CPR was initiated with initial rhythm of PEA.  Patient was intubated without difficulty.  Prior labs were significant for hyperkalemia and patient was given 2 rounds of epinephrine as well as calcium gluconate, remained in PEA throughout.  ROSC was subsequently obtained and patient seemed to be breathing on his own, was started on low-dose Precedex for sedation.  Hospitalist and Dr. Clayborn Bigness of cardiology were at the bedside, EKG after ROSC was similar to prior, no evidence of STEMI.  I was again called to the  bedside approximately 30 minutes after initial cardiac arrest as patient had lost pulses once again.  He required a single round of epinephrine and calcium with return of palpable pulses, remained in PEA throughout.  He was subsequently started on a Levophed drip and hospitalist was again at bedside.  Hospitalist to arrange for transfer to the ICU, where a bed is currently available.    Blake Divine, MD 10/27/19 [phone]

## 2019-10-27 NOTE — ED Notes (Signed)
Pt with agonal resp, diaphoretic witnessed by RN, No pulse noted.  CPR started.

## 2019-10-27 NOTE — ED Triage Notes (Signed)
FIRST NURSE NOTE- here for NVD for weeks. Arrived EMS.  Also would like a sore on ankle looked at. VSS with EMS.  133/98.

## 2019-10-27 NOTE — ED Notes (Signed)
CPR continues.  

## 2019-10-27 NOTE — ED Notes (Addendum)
Pulse check, no pulse noted, CPR resumes. MD at bedside. PEA on monitor

## 2019-10-27 NOTE — ED Notes (Signed)
This RN went in to get pt off the bedpan when pt stated that he could not breathe. This RN placed 2L BNC on the pt and called CN for assistance. Pt then began to agonal breathe and a code was called. EDP [place] and respiratory tech at bedside.

## 2019-10-28 ENCOUNTER — Inpatient Hospital Stay: Payer: Medicare Other

## 2019-10-28 ENCOUNTER — Other Ambulatory Visit: Payer: Self-pay

## 2019-10-28 DIAGNOSIS — L819 Disorder of pigmentation, unspecified: Secondary | ICD-10-CM

## 2019-10-28 DIAGNOSIS — T884XXA Failed or difficult intubation, initial encounter: Secondary | ICD-10-CM

## 2019-10-28 DIAGNOSIS — J9600 Acute respiratory failure, unspecified whether with hypoxia or hypercapnia: Secondary | ICD-10-CM

## 2019-10-28 DIAGNOSIS — E875 Hyperkalemia: Secondary | ICD-10-CM

## 2019-10-28 LAB — BLOOD GAS, ARTERIAL
Acid-Base Excess: 1.7 mmol/L (ref 0.0–2.0)
Acid-Base Excess: 8.7 mmol/L — ABNORMAL HIGH (ref 0.0–2.0)
Bicarbonate: 23.7 mmol/L (ref 20.0–28.0)
Bicarbonate: 31.5 mmol/L — ABNORMAL HIGH (ref 20.0–28.0)
FIO2: 0.8
FIO2: 100
MECHVT: 400 mL
MECHVT: 400 mL
Mechanical Rate: 14
O2 Saturation: 100 %
O2 Saturation: 99.9 %
PEEP: 12 cmH2O
PEEP: 8 cmH2O
Patient temperature: 37
Patient temperature: 37
RATE: 18 resp/min
pCO2 arterial: 29 mmHg — ABNORMAL LOW (ref 32.0–48.0)
pCO2 arterial: 36 mmHg (ref 32.0–48.0)
pH, Arterial: 7.52 — ABNORMAL HIGH (ref 7.350–7.450)
pH, Arterial: 7.55 — ABNORMAL HIGH (ref 7.350–7.450)
pO2, Arterial: 264 mmHg — ABNORMAL HIGH (ref 83.0–108.0)
pO2, Arterial: 481 mmHg — ABNORMAL HIGH (ref 83.0–108.0)

## 2019-10-28 LAB — FIBRIN DERIVATIVES D-DIMER (ARMC ONLY): Fibrin derivatives D-dimer (ARMC): >7500 ng/mL (FEU) — ABNORMAL HIGH (ref 0.00–499.00)

## 2019-10-28 LAB — CBC
HCT: 21.7 % — ABNORMAL LOW (ref 39.0–52.0)
HCT: 22.4 % — ABNORMAL LOW (ref 39.0–52.0)
Hemoglobin: 7 g/dL — ABNORMAL LOW (ref 13.0–17.0)
Hemoglobin: 7.3 g/dL — ABNORMAL LOW (ref 13.0–17.0)
MCH: 29.3 pg (ref 26.0–34.0)
MCH: 29.9 pg (ref 26.0–34.0)
MCHC: 32.3 g/dL (ref 30.0–36.0)
MCHC: 32.6 g/dL (ref 30.0–36.0)
MCV: 90.8 fL (ref 80.0–100.0)
MCV: 91.8 fL (ref 80.0–100.0)
Platelets: 49 10*3/uL — ABNORMAL LOW (ref 150–400)
Platelets: 50 10*3/uL — ABNORMAL LOW (ref 150–400)
RBC: 2.39 MIL/uL — ABNORMAL LOW (ref 4.22–5.81)
RBC: 2.44 MIL/uL — ABNORMAL LOW (ref 4.22–5.81)
RDW: 21.2 % — ABNORMAL HIGH (ref 11.5–15.5)
RDW: 21.3 % — ABNORMAL HIGH (ref 11.5–15.5)
WBC: 9.7 10*3/uL (ref 4.0–10.5)
WBC: 8.7 10*3/uL (ref 4.0–10.5)
nRBC: 0.2 % (ref 0.0–0.2)
nRBC: 0 % (ref 0.0–0.2)

## 2019-10-28 LAB — DIFFERENTIAL
Abs Immature Granulocytes: 0.06 10*3/uL (ref 0.00–0.07)
Basophils Absolute: 0 10*3/uL (ref 0.0–0.1)
Basophils Relative: 0 %
Eosinophils Absolute: 0 10*3/uL (ref 0.0–0.5)
Eosinophils Relative: 0 %
Immature Granulocytes: 1 %
Lymphocytes Relative: 5 %
Lymphs Abs: 0.5 10*3/uL — ABNORMAL LOW (ref 0.7–4.0)
Monocytes Absolute: 0.4 10*3/uL (ref 0.1–1.0)
Monocytes Relative: 5 %
Neutro Abs: 7.7 10*3/uL (ref 1.7–7.7)
Neutrophils Relative %: 89 %
Smear Review: NORMAL

## 2019-10-28 LAB — LACTIC ACID, PLASMA
Lactic Acid, Venous: 5.6 mmol/L (ref 0.5–1.9)
Lactic Acid, Venous: 3.2 mmol/L (ref 0.5–1.9)

## 2019-10-28 LAB — COMPREHENSIVE METABOLIC PANEL
ALT: 19 U/L (ref 0–44)
AST: 37 U/L (ref 15–41)
Albumin: 2 g/dL — ABNORMAL LOW (ref 3.5–5.0)
Alkaline Phosphatase: 58 U/L (ref 38–126)
Anion gap: 15 (ref 5–15)
BUN: 72 mg/dL — ABNORMAL HIGH (ref 6–20)
CO2: 30 mmol/L (ref 22–32)
Calcium: 8.2 mg/dL — ABNORMAL LOW (ref 8.9–10.3)
Chloride: 87 mmol/L — ABNORMAL LOW (ref 98–111)
Creatinine, Ser: 5.8 mg/dL — ABNORMAL HIGH (ref 0.61–1.24)
GFR calc Af Amer: 13 mL/min — ABNORMAL LOW (ref >60)
GFR calc non Af Amer: 11 mL/min — ABNORMAL LOW (ref >60)
Glucose, Bld: 125 mg/dL — ABNORMAL HIGH (ref 70–99)
Potassium: 4.7 mmol/L (ref 3.5–5.1)
Sodium: 132 mmol/L — ABNORMAL LOW (ref 135–145)
Total Bilirubin: 2.5 mg/dL — ABNORMAL HIGH (ref 0.3–1.2)
Total Protein: 4.3 g/dL — ABNORMAL LOW (ref 6.5–8.1)

## 2019-10-28 LAB — PROTIME-INR
INR: 2.4 — ABNORMAL HIGH (ref 0.8–1.2)
INR: 1.9 — ABNORMAL HIGH (ref 0.8–1.2)
Prothrombin Time: 26.3 seconds — ABNORMAL HIGH (ref 11.4–15.2)
Prothrombin Time: 21.5 seconds — ABNORMAL HIGH (ref 11.4–15.2)

## 2019-10-28 LAB — RENAL FUNCTION PANEL
Albumin: 1.9 g/dL — ABNORMAL LOW (ref 3.5–5.0)
Anion gap: 10 (ref 5–15)
BUN: 44 mg/dL — ABNORMAL HIGH (ref 6–20)
CO2: 28 mmol/L (ref 22–32)
Calcium: 7.9 mg/dL — ABNORMAL LOW (ref 8.9–10.3)
Chloride: 98 mmol/L (ref 98–111)
Creatinine, Ser: 3.49 mg/dL — ABNORMAL HIGH (ref 0.61–1.24)
GFR calc Af Amer: 23 mL/min — ABNORMAL LOW (ref >60)
GFR calc non Af Amer: 20 mL/min — ABNORMAL LOW (ref >60)
Glucose, Bld: 114 mg/dL — ABNORMAL HIGH (ref 70–99)
Phosphorus: 5.2 mg/dL — ABNORMAL HIGH (ref 2.5–4.6)
Potassium: 4.9 mmol/L (ref 3.5–5.1)
Sodium: 136 mmol/L (ref 135–145)

## 2019-10-28 LAB — GLUCOSE, CAPILLARY
Glucose-Capillary: 115 mg/dL — ABNORMAL HIGH (ref 70–99)
Glucose-Capillary: 112 mg/dL — ABNORMAL HIGH (ref 70–99)
Glucose-Capillary: 143 mg/dL — ABNORMAL HIGH (ref 70–99)

## 2019-10-28 LAB — PHOSPHORUS: Phosphorus: 8.3 mg/dL — ABNORMAL HIGH (ref 2.5–4.6)

## 2019-10-28 LAB — PREPARE RBC (CROSSMATCH)

## 2019-10-28 LAB — PROCALCITONIN: Procalcitonin: 3.1 ng/mL

## 2019-10-28 LAB — FIBRINOGEN: Fibrinogen: 136 mg/dL — ABNORMAL LOW (ref 210–475)

## 2019-10-28 LAB — CORTISOL-AM, BLOOD: Cortisol - AM: >100 ug/dL — ABNORMAL HIGH (ref 6.7–22.6)

## 2019-10-28 LAB — MAGNESIUM: Magnesium: 1.8 mg/dL (ref 1.7–2.4)

## 2019-10-28 LAB — TROPONIN I (HIGH SENSITIVITY)
Troponin I (High Sensitivity): 227 ng/L (ref <18)
Troponin I (High Sensitivity): 301 ng/L (ref <18)

## 2019-10-28 LAB — APTT
aPTT: 48 seconds — ABNORMAL HIGH (ref 24–36)
aPTT: 113 seconds — ABNORMAL HIGH (ref 24–36)
aPTT: 107 seconds — ABNORMAL HIGH (ref 24–36)
aPTT: 92 seconds — ABNORMAL HIGH (ref 24–36)

## 2019-10-28 LAB — SARS CORONAVIRUS 2 (TAT 6-24 HRS): SARS Coronavirus 2: NEGATIVE

## 2019-10-28 MED ORDER — SODIUM CHLORIDE 0.9% IV SOLUTION: Freq: Once | INTRAVENOUS | Status: DC

## 2019-10-28 MED ORDER — DOXYCYCLINE HYCLATE 100 MG IV SOLR
200.0000 mg | Freq: Two times a day (BID) | INTRAVENOUS | Status: DC
  Administered 2019-10-28 – 2019-10-30 (×4): 200 mg via INTRAVENOUS
  Filled 2019-10-28 (×5): qty 200

## 2019-10-28 MED ORDER — ARGATROBAN 50 MG/50ML IV SOLN
0.1000 ug/kg/min | INTRAVENOUS | Status: DC
  Administered 2019-10-28: 08:00:00 0.5 ug/kg/min via INTRAVENOUS
  Administered 2019-10-29: 0.15 ug/kg/min via INTRAVENOUS
  Administered 2019-11-03: 0.1 ug/kg/min via INTRAVENOUS
  Filled 2019-10-28 (×3): qty 50

## 2019-10-28 MED ORDER — SODIUM CHLORIDE 0.9% FLUSH
10.0000 mL | Freq: Two times a day (BID) | INTRAVENOUS | Status: DC
  Administered 2019-10-28 – 2019-11-04 (×11): 10 mL

## 2019-10-28 MED ORDER — PANTOPRAZOLE SODIUM 40 MG PO PACK
40.0000 mg | PACK | Freq: Every day | ORAL | Status: DC
  Administered 2019-10-28 – 2019-11-02 (×3): 40 mg
  Filled 2019-10-28 (×8): qty 20

## 2019-10-28 MED ORDER — MIDAZOLAM HCL 2 MG/2ML IJ SOLN
INTRAMUSCULAR | Status: AC
  Administered 2019-10-28: 2 mg
  Filled 2019-10-28: qty 2

## 2019-10-28 MED ORDER — MAGNESIUM SULFATE 2 GM/50ML IV SOLN
2.0000 g | Freq: Once | INTRAVENOUS | Status: AC
  Administered 2019-10-28: 2 g via INTRAVENOUS
  Filled 2019-10-28: qty 50

## 2019-10-28 MED ORDER — VITAL HIGH PROTEIN PO LIQD: 1000.0000 mL | ORAL | Status: DC

## 2019-10-28 MED ORDER — VANCOMYCIN HCL 500 MG/100ML IV SOLN
500.0000 mg | INTRAVENOUS | Status: DC
  Administered 2019-10-28 – 2019-10-30 (×3): 500 mg via INTRAVENOUS
  Filled 2019-10-28 (×4): qty 100

## 2019-10-28 MED ORDER — SODIUM CHLORIDE 0.9 % IV SOLN
2.0000 g | Freq: Two times a day (BID) | INTRAVENOUS | Status: DC
  Administered 2019-10-28 – 2019-10-31 (×7): 2 g via INTRAVENOUS
  Filled 2019-10-28 (×8): qty 2

## 2019-10-28 MED ORDER — B COMPLEX-C PO TABS
1.0000 | ORAL_TABLET | Freq: Every day | ORAL | Status: DC
  Administered 2019-10-28 – 2019-10-30 (×2): 1
  Filled 2019-10-28 (×5): qty 1

## 2019-10-28 MED ORDER — CHLORHEXIDINE GLUCONATE 0.12% ORAL RINSE (MEDLINE KIT)
15.0000 mL | Freq: Two times a day (BID) | OROMUCOSAL | Status: DC
  Administered 2019-10-28 – 2019-11-03 (×10): 15 mL via OROMUCOSAL
  Filled 2019-10-28: qty 15

## 2019-10-28 MED ORDER — CHLORHEXIDINE GLUCONATE CLOTH 2 % EX PADS
6.0000 | MEDICATED_PAD | Freq: Every day | CUTANEOUS | Status: DC
  Administered 2019-10-28 – 2019-11-04 (×5): 6 via TOPICAL

## 2019-10-28 MED ORDER — ANTICOAGULANT SODIUM CITRATE 4% (200MG/5ML) IV SOLN
5.0000 mL | Status: DC | PRN
  Filled 2019-10-28 (×4): qty 5

## 2019-10-28 MED ORDER — VITAL AF 1.2 CAL PO LIQD
1000.0000 mL | ORAL | Status: DC
  Administered 2019-10-28: 1000 mL

## 2019-10-28 MED ORDER — ORAL CARE MOUTH RINSE
15.0000 mL | OROMUCOSAL | Status: DC
  Administered 2019-10-28 – 2019-10-30 (×20): 15 mL via OROMUCOSAL

## 2019-10-28 MED ORDER — PRO-STAT SUGAR FREE PO LIQD
30.0000 mL | Freq: Every day | ORAL | Status: DC
  Administered 2019-10-28 – 2019-10-29 (×2): 30 mL

## 2019-10-28 MED ORDER — MUPIROCIN 2 % EX OINT
1.0000 "application " | TOPICAL_OINTMENT | Freq: Two times a day (BID) | CUTANEOUS | Status: AC
  Administered 2019-10-28 – 2019-11-02 (×9): 1 via NASAL
  Filled 2019-10-28 (×2): qty 22

## 2019-10-28 MED ORDER — SODIUM CHLORIDE 0.9% FLUSH
10.0000 mL | INTRAVENOUS | Status: DC | PRN
  Administered 2019-10-30 (×2): 20 mL
  Administered 2019-10-30: 30 mL

## 2019-10-28 MED ORDER — CHLORHEXIDINE GLUCONATE CLOTH 2 % EX PADS: 6.0000 | MEDICATED_PAD | Freq: Every day | CUTANEOUS | Status: DC

## 2019-10-28 NOTE — Progress Notes (Addendum)
Pharmacy Antibiotic Note  Brett Small is a 44 y.o. male admitted on 10/27/2019.  Patient has past medical history of ESRD, hypertension, and peripheral artery disease.  Pharmacy has been consulted for vancomycin and cefepime dosing for sepsis.  CXR suggests pneumonia, and patient also has acute respiratory failure.  On 1/5 patient experienced cardiac arrest x2.  Patient is on HD TTS, but has been changed to CRRT inpatient due to hemodynamic instability.  Patient is currently afebrile with WBC wnl.  PCT trending up (2.5-3.1).  Currently antibiotics day 2.  Loading dose of vanc and cefepime given in ED.  Plan: As patient is on CRRT, dose has been adjusted as follows per [facility] Antimicrobial Dosing Guidelines:  1.  Vancomycin Will give Vancomycin 500 mg q24 hours (10 mg/kg q24 hours)  2.  Cefepime Will give Cefepime 2 g q12 hours   Pharmacy will follow and adjust based on nephrology plan.  Height: 5\' 1"  (154.9 cm) Weight: 103 lb 2.8 oz (46.8 kg) IBW/kg (Calculated) : 52.3  Temp (24hrs), Avg:97.8 F (36.6 C), Min:96.6 F (35.9 C), Max:98.6 F (37 C)  Recent Labs  Lab 10/27/19 1307 10/27/19 1823 10/28/19 0045 10/28/19 0301 10/28/19 0445 10/28/19 0756  WBC 13.3* 14.3*  --   --  9.7 8.7  CREATININE 6.34* 5.18*  --   --  5.80*  --   LATICACIDVEN >11.0*  7.5*  --  5.6* 3.2*  --   --     Estimated Creatinine Clearance: 10.6 mL/min (A) (by C-G formula based on SCr of 5.8 mg/dL (H)).    Allergies  Allergen Reactions  . Codeine Nausea Only    Patient questioned this (??)  . Sulfa Antibiotics Hives and Nausea And Vomiting    Antimicrobials this admission: Vanc 1/5 >>  Cefepime 1/5 >>  Azithromycin 1/5 >> 1/6 Doxycycline 1/6 >>  Dose adjustments this admission: NA  Microbiology results: 1/5 BCx: NG x 1 day 1/5 MRSA: positive 1/5 UCx: pending    Thank you for allowing pharmacy to be a part of this patient's care.  Gerald Dexter, PharmD 10/28/2019 1:32  PM

## 2019-10-28 NOTE — Progress Notes (Addendum)
CRITICAL CARE PROGRESS NOTE    Name: Brett Small MRN: [id number] DOB: 13-Jun-1976     LOS: 1   SUBJECTIVE FINDINGS & SIGNIFICANT EVENTS   Patient description: Brett Small is a 44 yo male with a history of ESRD, hypertension, right arm DVT in 09/2019, HFrEF, and NSTEMI in 06/2019 who presented to the ED on 10/27/2019 for chest pain, SOB, productive cough and N/V.  He was scheduled for dialysis 10/27/2019, but reported that he felt too ill to go, and presented to the ED instead.    At the ED, patient was found to have sepsis due to pneumonia, and NSTEMI.  Lactic acid 7.5 mmol/L.  Patient appeared to be in metabolic acidosis with pH 6.9, respiratory compensation with CO2 11 and anion gap 24.  Potassium 6.6, and creatinine 6.34.  He was admitted by the hospitalist team, but while awaiting a room, he went into cardiac arrest in the ED.  CPR was initiated with PEA, and patient was intubated, and given 2 rounds of epinephrine and calcium gluconate.  First code 11 minutes, second code 10 minutes.  He was then admitted to the ICU on 10/26/2018 for severe acute hypoxic respiratory failure, and acute cardiac failure.     Lines / Drains: ET tube on vent PEEP 10 FiO2 60% Fistula left upper arm CVC triple lumen right femoral Hemodialysis catheter right IJ OG tube  Cultures / Sepsis markers: WBC improving from 14.3 yesterday to 8.7 today.   Patient afebrile for past 24 hours 97.29F currently.  Tachycardia upon admission, now 70 bpm. Blood cultures demonstrated no growth < 24 hours.    Antibiotics: Cefepime 2g Q12h Vancomycin 500 mg QD  On Azithromycin 500 mg yesterday 10/27/2019.  Discontinued due to QT prolongation.    Protocols / Consultants: Nephrology  Tests / Events: 01/05: Patient presented to ED with SOB, emesis,  chest pain.  ED workup consistent with NSTEMI and pneumonia with sepsis.  Patient coded twice due to PEA in ED for a combined time of 21 minutes.  Admitted to ICU. 01/06: Patient's right hand cold and mottled concerning for right forearm DVT.  Venous doppler ultrasound consistent with right forearm radial vein DVT.   MEDICATIONS   Current Medication:  Current Facility-Administered Medications:  .   prismasol BGK 4/2.5 infusion, , CRRT, Continuous, Lateef, Munsoor, MD, Last Rate: 400 mL/hr at 10/28/19 0420, New Bag at 10/28/19 0420 .   prismasol BGK 4/2.5 infusion, , CRRT, Continuous, Lateef, Munsoor, MD, Last Rate: 400 mL/hr at 10/28/19 0420, New Bag at 10/28/19 0420 .  0.9 %  sodium chloride infusion (Manually program via Guardrails IV Fluids), , Intravenous, Once, Darel Hong D, NP .  acetaminophen (TYLENOL) tablet 650 mg, 650 mg, Oral, Q6H PRN **OR** acetaminophen (TYLENOL) suppository 650 mg, 650 mg, Rectal, Q6H PRN, Nicole Kindred A, DO .  argatroban 1 mg/mL infusion, 0.35 mcg/kg/min, Intravenous, Continuous, Gerald Dexter, [facility], Last Rate: 1.4 mL/hr at 10/28/19 0824, 0.5 mcg/kg/min at 10/28/19 0824 .  B-complex with vitamin C tablet 1 tablet, 1 tablet, Per Tube, QHS, Tyler Pita, MD .  bisacodyl (DULCOLAX) EC tablet 5 mg, 5 mg, Oral, Daily PRN, Nicole Kindred A, DO .  calcium gluconate 1 g/ 50 mL sodium chloride IVPB, 1 g, Intravenous, Once, Gregor Hams, MD .  calcium gluconate 1 g/ 50 mL sodium chloride IVPB, 1 g, Intravenous, Once, Nicole Kindred A, DO .  ceFEPIme (MAXIPIME) 2 g in sodium chloride 0.9 % 100 mL IVPB, 2  g, Intravenous, Q12H, Gerald Dexter, [facility], Last Rate: 200 mL/hr at 10/28/19 1024, 2 g at 10/28/19 1024 .  chlorhexidine gluconate (MEDLINE KIT) (PERIDEX) 0.12 % solution 15 mL, 15 mL, Mouth Rinse, BID, Kasa, Kurian, MD, 15 mL at 10/28/19 0755 .  Chlorhexidine Gluconate Cloth 2 % PADS 6 each, 6 each, Topical, Daily, Darel Hong D, NP, 6 each  at 10/28/19 1102 .  dexmedetomidine (PRECEDEX) 200 MCG/50ML (4 mcg/mL) infusion, 0.4-1.2 mcg/kg/hr, Intravenous, Titrated, Ezekiel Slocumb, DO, Last Rate: 9.36 mL/hr at 10/28/19 0943, 0.8 mcg/kg/hr at 10/28/19 0943 .  doxycycline (VIBRAMYCIN) 200 mg in dextrose 5 % 250 mL IVPB, 200 mg, Intravenous, Q12H, Tyler Pita, MD .  feeding supplement (PRO-STAT SUGAR FREE 64) liquid 30 mL, 30 mL, Per Tube, Daily, Tyler Pita, MD .  feeding supplement (VITAL AF 1.2 CAL) liquid 1,000 mL, 1,000 mL, Per Tube, Continuous, Tyler Pita, MD .  fentaNYL (SUBLIMAZE) injection 100 mcg, 100 mcg, Intravenous, Q1H PRN, Flora Lipps, MD, 100 mcg at 10/27/19 1905 .  heparin injection 1,000-6,000 Units, 1,000-6,000 Units, CRRT, PRN, Lateef, Munsoor, MD .  hydrocortisone sodium succinate (SOLU-CORTEF) 100 MG injection 50 mg, 50 mg, Intravenous, Q6H, Darel Hong D, NP, 50 mg at 10/28/19 0845 .  LORazepam (ATIVAN) injection 2-4 mg, 2-4 mg, Intravenous, Q4H PRN, Flora Lipps, MD, 4 mg at 10/27/19 1905 .  magnesium citrate solution 1 Bottle, 1 Bottle, Oral, Once PRN, Nicole Kindred A, DO .  MEDLINE mouth rinse, 15 mL, Mouth Rinse, 10 times per day, Flora Lipps, MD, 15 mL at 10/28/19 0944 .  norepinephrine (LEVOPHED) 16 mg in 253m premix infusion, 16 mcg/min, Intravenous, Titrated, KBradly Bienenstock NP, Stopped at 10/27/19 2251 .  ondansetron (ZOFRAN) tablet 4 mg, 4 mg, Oral, Q6H PRN **OR** ondansetron (ZOFRAN) injection 4 mg, 4 mg, Intravenous, Q6H PRN, GNicole KindredA, DO, 4 mg at 10/27/19 1656 .  pantoprazole sodium (PROTONIX) 40 mg/20 mL oral suspension 40 mg, 40 mg, Per Tube, QHS, GTyler Pita MD .  patiromer ([facility] packet 8.4 g, 8.4 g, Oral, Daily, BGregor Hams MD, 8.4 g at 10/27/19 1538 .  phenylephrine (NEO-SYNEPHRINE) 10 mg in sodium chloride 0.9 % 250 mL (0.04 mg/mL) infusion, 0-400 mcg/min, Intravenous, Titrated, Kasa, Kurian, MD .  polyethylene glycol (MIRALAX / GLYCOLAX)  packet 17 g, 17 g, Oral, Daily PRN, GNicole KindredA, DO .  prismasol BGK 4/2.5 infusion, , CRRT, Continuous, Lateef, Munsoor, MD, Last Rate: 1,000 mL/hr at 10/28/19 0935, New Bag at 10/28/19 0935 .  traMADol (ULTRAM) tablet 50 mg, 50 mg, Oral, Q12H PRN, GNicole KindredA, DO .  vancomycin (VANCOREADY) IVPB 500 mg/100 mL, 500 mg, Intravenous, Q24H, MGerald Dexter RPH .  vasopressin (PITRESSIN) 40 Units in sodium chloride 0.9 % 250 mL (0.16 Units/mL) infusion, 0.03 Units/min, Intravenous, Continuous, KBradly Bienenstock NP, Stopped at 10/27/19 2209 .  vecuronium (NORCURON) injection 10 mg, 10 mg, Intravenous, Q1H PRN, KFlora Lipps MD, 10 mg at 10/27/19 1906    ALLERGIES   Codeine and Sulfa antibiotics    REVIEW OF SYSTEMS   Unable to obtain due to patient condition.    PHYSICAL EXAMINATION   Vital Signs: Temp:  [96.6 F (35.9 C)-98.6 F (37 C)] 97.6 F (36.4 C) (01/06 0750) Pulse Rate:  [70-127] 70 (01/06 1100) Resp:  [14-26] 16 (01/06 1100) BP: (58-139)/(17-106) 127/94 (01/06 0200) SpO2:  [38 %-100 %] 100 % (01/06 1100) Arterial Line BP: (105-163)/(60-108) 105/60 (01/06 1100) FiO2 (%):  [  60 %-100 %] 60 % (01/06 0750) Weight:  [46.8 kg] 46.8 kg (01/06 0500)  GENERAL: Patient sedated and ill appearing.   HEAD: Normocephalic, atraumatic.  EYES: Pupils equal, round, reactive to light.  No scleral icterus.  MOUTH: Orotracheally intubated. NECK: Supple. No thyromegaly. No nodules. No JVD.  PULMONARY: Lung sounds diminished in bilateral lower lobes.  CARDIOVASCULAR: S1 and S2. Regular rate and rhythm. No murmurs, rubs, or gallops.  GASTROINTESTINAL: Soft, nontender, non-distended. No masses. Positive bowel sounds. No hepatosplenomegaly.  MUSCULOSKELETAL: Cool right hand with pallor dusky fingers swelling on the right upper extremity (DVT noted on ultrasound) NEUROLOGIC: Patient sedated.  Does not open eyes in response to voice or pain.  Does not follow one step commands.   Incremental non-purposeful movement in left foot.   SKIN:intact,warm,dry   PERTINENT DATA    Infusions: .  prismasol BGK 4/2.5 400 mL/hr at 10/28/19 0420  .  prismasol BGK 4/2.5 400 mL/hr at 10/28/19 0420  . argatroban 0.5 mcg/kg/min (10/28/19 0824)  . calcium gluconate    . calcium gluconate    . ceFEPime (MAXIPIME) IV 2 g (10/28/19 1024)  . dexmedetomidine (PRECEDEX) IV infusion 0.8 mcg/kg/hr (10/28/19 0943)  . doxycycline (VIBRAMYCIN) IV    . feeding supplement (VITAL AF 1.2 CAL)    . norepinephrine (LEVOPHED) Adult infusion Stopped (10/27/19 2251)  . phenylephrine (NEO-SYNEPHRINE) Adult infusion    . prismasol BGK 4/2.5 1,000 mL/hr at 10/28/19 0935  . vancomycin    . vasopressin (PITRESSIN) infusion - *FOR SHOCK* Stopped (10/27/19 2209)   Scheduled Medications: . sodium chloride   Intravenous Once  . B-complex with vitamin C  1 tablet Per Tube QHS  . chlorhexidine gluconate (MEDLINE KIT)  15 mL Mouth Rinse BID  . Chlorhexidine Gluconate Cloth  6 each Topical Daily  . feeding supplement (PRO-STAT SUGAR FREE 64)  30 mL Per Tube Daily  . hydrocortisone sod succinate (SOLU-CORTEF) inj  50 mg Intravenous Q6H  . mouth rinse  15 mL Mouth Rinse 10 times per day  . pantoprazole sodium  40 mg Per Tube QHS  . patiromer  8.4 g Oral Daily   PRN Medications: acetaminophen **OR** acetaminophen, bisacodyl, fentaNYL (SUBLIMAZE) injection, heparin, LORazepam, magnesium citrate, ondansetron **OR** ondansetron (ZOFRAN) IV, polyethylene glycol, traMADol, vecuronium Hemodynamic parameters:   Intake/Output: 01/05 0701 - 01/06 0700 In: 1633.5 [I.V.:1435.9; IV Piggyback:197.6] Out: 221 [Stool:1]  Ventilator  Settings: Vent Mode: PRVC FiO2 (%):  [60 %-100 %] 60 % Set Rate:  [14 bmp-18 bmp] 14 bmp Vt Set:  [400 mL] 400 mL PEEP:  [5 cmH20-12 cmH20] 5 cmH20 Plateau Pressure:  [17 cmH20] 17 cmH20   Other Labs:  LAB RESULTS:  Basic Metabolic Panel: Recent Labs  Lab 10/27/19 1307  10/27/19 1823 10/28/19 0445  NA 131* 131* 132*  K 6.6* 4.8 4.7  CL 89* 99 87*  CO2 18* 11* 30  GLUCOSE 107* 104* 125*  BUN 70* 60* 72*  CREATININE 6.34* 5.18* 5.80*  CALCIUM 9.2 >15.0* 8.2*  MG  --   --  1.8  PHOS  --   --  8.3*   Liver Function Tests: Recent Labs  Lab 10/27/19 1823 10/28/19 0445  AST 29 37  ALT 14 19  ALKPHOS 63 58  BILITOT 1.9* 2.5*  PROT 5.0* 4.3*  ALBUMIN 2.1* 2.0*   No results for input(s): LIPASE, AMYLASE in the last 168 hours. No results for input(s): AMMONIA in the last 168 hours. CBC: Recent Labs  Lab 10/27/19 1307 10/27/19  1823 10/28/19 0445 10/28/19 0756  WBC 13.3* 14.3* 9.7 8.7  NEUTROABS  --   --   --  7.7  HGB 9.7* 9.0* 7.0* 7.3*  HCT 31.6* 31.5* 21.7* 22.4*  MCV 94.9 100.0 90.8 91.8  PLT 133* 71* 49* 50*   Cardiac Enzymes: No results for input(s): CKTOTAL, CKMB, CKMBINDEX, TROPONINI in the last 168 hours. BNP: Invalid input(s): POCBNP CBG: Recent Labs  Lab 10/27/19 1730 10/27/19 1854 10/28/19 1159  GLUCAP 71 83 115*     IMAGING RESULTS:  Imaging: DG Chest 1 View  Result Date: 10/27/2019 CLINICAL DATA:  Difficult intubation status post CPR EXAM: CHEST  1 VIEW COMPARISON:  October 27, 2019 at 12:19 p.m. FINDINGS: The heart size remains enlarged. There is a small to moderate-sized left-sided pleural effusion. There is a small right-sided pleural effusion. There is diffuse increased attenuation within the left lung field. Prominent interstitial lung markings are noted. There is a stable tunneled dialysis catheter on the right. The endotracheal tube terminates approximately 3 cm above the carina. There is no pneumothorax. IMPRESSION: 1. Lines and tubes as above. 2. Persistent bilateral pleural effusions, left greater than right. 3. Worsening left-sided airspace disease concerning for pneumonia. Electronically Signed   By: Constance Holster M.D.   On: 10/27/2019 20:07   DG Chest 2 View  Result Date: 10/27/2019 CLINICAL DATA:   44 year old male with history of chest pain. Vomiting for the past 2 weeks. Diarrhea since yesterday. EXAM: CHEST - 2 VIEW COMPARISON:  Chest x-ray 10/03/2019. FINDINGS: Right internal jugular PermCath with tips terminating in the right atrium and superior cavoatrial junction. Left upper extremity vascular stent extending into the axillary region. Lung volumes are slightly low. Opacity at the left base may reflect atelectasis and/or consolidation. Small to moderate left pleural effusion, slightly decreased compared to the prior study. Right lung is clear. No right pleural effusion. No pneumothorax. No definite suspicious appearing pulmonary nodules or masses. No evidence of pulmonary edema. Mild cardiomegaly. Upper mediastinal contours are within normal limits. IMPRESSION: 1. Support apparatus and postoperative changes, as above. 2. Left lower lobe atelectasis and/or consolidation with superimposed small to moderate left pleural effusion, slightly decreased compared to the prior study. Electronically Signed   By: Vinnie Langton M.D.   On: 10/27/2019 12:36   DG Ankle Complete Right  Result Date: 10/27/2019 CLINICAL DATA:  Chronic wound with cellulitis EXAM: RIGHT ANKLE - COMPLETE 3+ VIEW COMPARISON:  None. FINDINGS: Frontal, oblique, and lateral views obtained. There is evidence of old trauma involving the calcaneus with remodeling. No acute fracture or joint effusion is evident. There is narrowing of the posterior aspect of the ankle joint. There is arthropathy throughout the subtalar joint regions. No frank erosion or bony destruction evident. IMPRESSION: Evidence of old trauma involving the calcaneus. Arthropathy throughout the subtalar joints. There is narrowing of the posterior ankle joint. No frank bony destruction appreciated by radiography. No acute fracture or joint effusion evident. If there remains concern for osteomyelitis, MR or nuclear medicine three-phase bone scan could be helpful for further  assessment. Electronically Signed   By: Lowella Grip III M.D.   On: 10/27/2019 13:09   DG Abd 1 View  Result Date: 10/28/2019 CLINICAL DATA:  OG tube placement. EXAM: ABDOMEN - 1 VIEW COMPARISON:  Single-view of the abdomen 10/04/2019. FINDINGS: OG tube is in place with the tip in the distal stomach. IMPRESSION: As above. Electronically Signed   By: Inge Rise M.D.   On: 10/28/2019 11:56  CT HEAD WO CONTRAST  Result Date: 10/28/2019 CLINICAL DATA:  Cardiac arrest. EXAM: CT HEAD WITHOUT CONTRAST TECHNIQUE: Contiguous axial images were obtained from the base of the skull through the vertex without intravenous contrast. COMPARISON:  None. FINDINGS: Brain: There is no mass, hemorrhage or extra-axial collection. The size and configuration of the ventricles and extra-axial CSF spaces are normal. The brain parenchyma is normal, without acute or chronic infarction. Vascular: No abnormal hyperdensity of the major intracranial arteries or dural venous sinuses. No intracranial atherosclerosis. Skull: The visualized skull base, calvarium and extracranial soft tissues are normal. Sinuses/Orbits: No fluid levels or advanced mucosal thickening of the visualized paranasal sinuses. No mastoid or middle ear effusion. The orbits are normal. IMPRESSION: Normal head CT. Electronically Signed   By: Ulyses Jarred M.D.   On: 10/28/2019 01:50   US Venous Img Upper Uni Right(DVT)  Result Date: 10/28/2019 CLINICAL DATA:  Dialysis patient, right arm graft removed, skin changes and swelling. EXAM: RIGHT UPPER EXTREMITY VENOUS DOPPLER ULTRASOUND TECHNIQUE: Gray-scale sonography with graded compression, as well as color Doppler and duplex ultrasound were performed to evaluate the upper extremity deep venous system from the level of the subclavian vein and including the jugular, axillary, basilic, radial, ulnar and upper cephalic vein. Spectral Doppler was utilized to evaluate flow at rest and with distal augmentation  maneuvers. COMPARISON:  None. FINDINGS: Contralateral Subclavian Vein: Respiratory phasicity is normal and symmetric with the symptomatic side. No evidence of thrombus. Normal compressibility. Internal Jugular Vein: No evidence of thrombus. Normal compressibility, respiratory phasicity and response to augmentation. Subclavian Vein: No evidence of thrombus. Normal compressibility, respiratory phasicity and response to augmentation. Axillary Vein: No evidence of thrombus. Normal compressibility, respiratory phasicity and response to augmentation. Cephalic Vein: No evidence of thrombus. Normal compressibility, respiratory phasicity and response to augmentation. Basilic Vein: No evidence of thrombus. Normal compressibility, respiratory phasicity and response to augmentation. Brachial Veins: No evidence of thrombus. Normal compressibility, respiratory phasicity and response to augmentation. Radial Veins: The forearm radial vein demonstrates hypoechoic thrombus appearing occlusive. Vein noncompressible. Minor thrombus burden without propagation into the brachial vein of the upper arm. Ulnar Veins: No evidence of thrombus. Normal compressibility, respiratory phasicity and response to augmentation. IMPRESSION: Right forearm radial vein DVT. Very low thrombus burden. No propagation into the brachial vein. Electronically Signed   By: Jerilynn Mages.  Shick M.D.   On: 10/28/2019 10:31   DG Chest Port 1 View  Result Date: 10/28/2019 CLINICAL DATA:  Acute respiratory failure EXAM: PORTABLE CHEST 1 VIEW COMPARISON:  Radiograph 10/27/2019 FINDINGS: Dual lumen right IJ approach dialysis catheter tip terminates at the level of the right atrium and superior cavoatrial junction. An endotracheal tube terminates low in the trachea, approximately 1 cm from the carina. Pacer pad overlies the left chest wall. Additional support devices project over the chest. Left axillary vascular stent is again seen. Lung volumes remain diminished with bilateral  patchy airspace opacities similar on the left and slightly increasing on the right when compared to prior imaging. No pneumothorax. Suspect left pleural effusion. No visible right effusion. No acute osseous or soft tissue abnormality. IMPRESSION: 1. Endotracheal tube terminates low in the trachea, approximately 1 cm from the carina. Consider retraction 2-3 cm to position in the mid trachea. 2. Bilateral patchy airspace opacities similar on the left and slightly increasing on the right when compared to prior imaging. Worrisome for infectious consolidation versus edema. 3. Left pleural effusion. These results will be called to the ordering clinician or representative by  the Radiologist Assistant, and communication documented in the PACS or zVision Dashboard. Electronically Signed   By: Lovena Le M.D.   On: 10/28/2019 03:13    _0 @   ASSESSMENT AND PLAN    -Multidisciplinary rounds held today  Acute Hypoxic Respiratory Failure due to pneumonia, left pleural effusion, HFrEF and s/p acute cardiac arrest -continue Full MV support - Patient on cefepime and vancomycin for pneumonia.  Will get tracheal aspirate culture and sensitivity and will adjust antibiotic as needed.   -continue Bronchodilator Therapy -Wean Fio2 and PEEP as tolerated  -will perform SAT/SBT when respiratory parameters are met   Acute on chronic systolic heart failure with PEA 10/27/2019 on chronic HF with reduced EF <20% - Continue ICU telemetry monitoring -Lasix as tolerated -follow up cardiac enzymes as indicated ICU monitoring - Therapeutic hypothermia contraindicated due to repeat cardiac arrest and persistent shock physiology  Disseminated Intravascular Coagulation - Platelets 50k, Prolonged PT/INR of 21.5 and 1.9 respectively, Low fibrinogen 136, Significantly elevated Fibrin derivatives D-dimer 7500 - No occult bleed, but DVT identified in right radial vein.   - Possibly due to sepsis - Continue argatroban -  Continue ventilator support, continue ICU monitoring  DVT in right radial vein - Mottling of right forarm and dorsal hand. - Patient on argatroban   Ischemia of RIGHT hand Vascular surgery consulted, appreciate input  End Stage Renal Disease - Potassium (4.7) and Creatinine (5.8) improving, but remain elevated.    - Followed by Dr. Holley Raring- appreciate the input - Patient undergoing Hemodialysis today. -follow chem 7 -follow UO -continue Foley Catheter-assess need daily   NEUROLOGY - intubated and sedated with precedex - Patient previously reported history of seizure disorder identified at prior hospital in [place] on Keppra   - Patient undergoing EEG- appreciate neurology input - minimal sedation to achieve a RASS goal: -1 Wake up assessment pending  GI/Nutrition GI PROPHYLAXIS as indicated DIET-->Continue tube feeds as tolerated Constipation protocol as indicated  ENDO - ICU hypoglycemic\Hyperglycemia protocol -check FSBS per protocol    Critical care provider statement:    Critical care time (minutes):  45  Multidisciplinary rounds were performed with the ICU team  Patient is critically ill and at risk for recurrent cardiac arrest and death.  He is currently DNR which is appropriate.  Prognosis long-term is exceedingly poor.  Jeralyn Bennett PA-S acting as scribe for Dr. Patsey Berthold.      Renold Don, MD [facility]                                                                                               *This note was dictated using voice recognition software/Dragon.  Despite best efforts to proofread, errors can occur which can change the meaning.  Any change was purely unintentional.

## 2019-10-28 NOTE — Consult Note (Addendum)
PHARMACY CONSULT NOTE - FOLLOW UP  Pharmacy Consult for Electrolyte Monitoring and Replacement   Recent Labs: Potassium (mmol/L)  Date Value  10/28/2019 4.7   Magnesium (mg/dL)  Date Value  10/28/2019 1.8   Calcium (mg/dL)  Date Value  10/28/2019 8.2 (L)   Albumin (g/dL)  Date Value  10/28/2019 2.0 (L)   Phosphorus (mg/dL)  Date Value  10/28/2019 8.3 (H)   Sodium (mmol/L)  Date Value  10/28/2019 132 (L)     Assessment: Brett Small is a 43 y.o. male admitted on 10/27/2019 with sepsis and acute respiratory failure, as well as cardiac arrest x2 (but did not qualify for hypothermia protocol).  Patient has past medical history of ESRD, hypertension, and peripheral artery disease.   Patient normally receives HD TTS, but has been changed to CRRT inpatient due to hemodynamic instability.  He is currently ventilated.  Patient is on [place].  Corrected Ca:  9.4  Goal of Therapy:  Given cardiac history: K 4.0-5.1 Magnesium 2.0-2.4 All other electrolytes WNL  Plan:  Will give magnesium 2 g IV x 1 dose Pharmacy will follow magnesium and BMP in the AM.  Gerald Dexter, PharmD Pharmacy Resident  10/28/2019 2:35 PM

## 2019-10-28 NOTE — Progress Notes (Signed)
PHARMACY - PHYSICIAN COMMUNICATION CRITICAL VALUE ALERT - BLOOD CULTURE IDENTIFICATION (BCID)  Brett Small is an 44 y.o. male who presented to [facility] on 10/27/2019 with a chief complaint of sepsis  Assessment:  Lab reports 1 of 4 bottles + for GPR, will be sent to [facility] for w/u  Name of physician (or Provider) Contacted: Tana Conch NP  Current antibiotics: Vancomycin, Doxycycline, Cefepime  Changes to prescribed antibiotics recommended:  Patient is on recommended antibiotics - No changes needed, f/u when more data available  Results for orders placed or performed during the hospital encounter of 05/19/19  Blood Culture ID Panel (Reflexed) (Collected: 05/19/2019  7:08 AM)  Result Value Ref Range   Enterococcus species NOT DETECTED NOT DETECTED   Listeria monocytogenes NOT DETECTED NOT DETECTED   Staphylococcus species DETECTED (A) NOT DETECTED   Staphylococcus aureus (BCID) NOT DETECTED NOT DETECTED   Methicillin resistance DETECTED (A) NOT DETECTED   Streptococcus species NOT DETECTED NOT DETECTED   Streptococcus agalactiae NOT DETECTED NOT DETECTED   Streptococcus pneumoniae NOT DETECTED NOT DETECTED   Streptococcus pyogenes NOT DETECTED NOT DETECTED   Acinetobacter baumannii NOT DETECTED NOT DETECTED   Enterobacteriaceae species NOT DETECTED NOT DETECTED   Enterobacter cloacae complex NOT DETECTED NOT DETECTED   Escherichia coli NOT DETECTED NOT DETECTED   Klebsiella oxytoca NOT DETECTED NOT DETECTED   Klebsiella pneumoniae NOT DETECTED NOT DETECTED   Proteus species NOT DETECTED NOT DETECTED   Serratia marcescens NOT DETECTED NOT DETECTED   Haemophilus influenzae NOT DETECTED NOT DETECTED   Neisseria meningitidis NOT DETECTED NOT DETECTED   Pseudomonas aeruginosa NOT DETECTED NOT DETECTED   Candida albicans NOT DETECTED NOT DETECTED   Candida glabrata NOT DETECTED NOT DETECTED   Candida krusei NOT DETECTED NOT DETECTED   Candida parapsilosis NOT DETECTED NOT DETECTED   Candida tropicalis NOT DETECTED NOT DETECTED    Hart Robinsons A 10/28/2019  10:14 PM

## 2019-10-28 NOTE — Progress Notes (Signed)
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  Patient Measurements: Height: 5\' 1"  (154.9 cm) Weight: 103 lb 2.8 oz (46.8 kg) IBW/kg (Calculated) : 52.3 HEPARIN DW (KG): 46.8  Vital Signs: Temp: 98.2 F (36.8 C) (01/06 2000) Temp Source: Oral (01/06 2000) BP: 107/73 (01/06 2200) Pulse Rate: 67 (01/06 2200)  Labs: Recent Labs    10/27/19 1307 10/27/19 1449 10/27/19 1823 10/27/19 2152 10/28/19 0045 10/28/19 0301 10/28/19 0445 10/28/19 0756 10/28/19 0842 10/28/19 1137 10/28/19 1443 10/28/19 2206  HGB  --   --  9.0*  --   --   --  7.0* 7.3*  --   --   --   --   HCT  --   --  31.5*  --   --   --  21.7* 22.4*  --   --   --   --   PLT  --   --  71*  --   --   --  49* 50*  --   --   --   --   APTT   < > 36  --   --   --   --   --   --  48* 113* 107* 92*  LABPROT  --  22.0*  --   --   --   --  26.3*  --  21.5*  --   --   --   INR  --  1.9*  --   --   --   --  2.4*  --  1.9*  --   --   --   CREATININE  --   --  5.18*  --   --   --  5.80*  --   --   --  3.49*  --   TROPONINIHS   < > 65* 63* 147* 227* 301*  --   --   --   --   --   --    < > = values in this interval not displayed.   Estimated Creatinine Clearance: 17.7 mL/min (A) (by C-G formula based on SCr of 3.49 mg/dL (H)).  Medical History: Past Medical History:  Diagnosis Date  . ESRD (end stage renal disease) ([place])   . Hypertension   . PAD (peripheral artery disease) ([facility])    Required aortofemoral stent-which had closed and had to redo the procedure and ischemia of limb.  . Peripheral vascular disease ([place])   . Renal disorder   . Secondary hyperparathyroidism of renal origin [facility])    Medications:  Medications Prior to Admission  Medication Sig Dispense Refill Last Dose  . amLODipine (NORVASC) 5 MG tablet Take 1 tablet (5 mg total) by mouth daily. 30 tablet 3 unknown at unknown  . carvedilol (COREG) 25 MG tablet Take 1 tablet (25 mg total) by mouth 2 (two) times daily  with a meal. 60 tablet 1 unknown at unknown  . apixaban (ELIQUIS) 2.5 MG TABS tablet Take 1 tablet (2.5 mg total) by mouth 2 (two) times daily. (Patient not taking: Reported on 10/28/2019) 60 tablet 1 Not Taking at Unknown time  . atorvastatin (LIPITOR) 80 MG tablet Take 1 tablet (80 mg total) by mouth at bedtime. (Patient not taking: Reported on 10/28/2019) 30 tablet 1 Not Taking at Unknown time  . b complex-C-folic acid 1 MG capsule Take 1 capsule by mouth daily after supper.    Not Taking at Unknown time  . carvedilol (COREG) 25 MG tablet Take 1  tablet (25 mg total) by mouth 2 (two) times daily with a meal. (Patient not taking: Reported on 10/28/2019) 60 tablet 0 Not Taking at Unknown time  . gabapentin (NEURONTIN) 100 MG capsule Take 1 capsule (100 mg total) by mouth 3 (three) times daily. (Patient not taking: Reported on 10/28/2019) 90 capsule 1 Not Taking at Unknown time  . hydrALAZINE (APRESOLINE) 25 MG tablet Take 1 tablet (25 mg total) by mouth every 8 (eight) hours. (Patient not taking: Reported on 10/28/2019) 90 tablet 1 Not Taking at Unknown time  . hydrOXYzine (ATARAX/VISTARIL) 25 MG tablet Take 25 mg by mouth 3 (three) times daily as needed for anxiety.   Not Taking at Unknown time  . levETIRAcetam (KEPPRA) 500 MG tablet Take 1 tablet (500 mg total) by mouth 2 (two) times daily. (Patient not taking: Reported on 10/28/2019) 60 tablet 0 Not Taking at Unknown time  . loratadine (CLARITIN) 10 MG tablet Take 1 tablet (10 mg total) by mouth daily. (Patient not taking: Reported on 10/28/2019) 30 tablet 0 Not Taking at Unknown time  . multivitamin (RENA-VIT) TABS tablet Take 1 tablet by mouth daily. (Patient not taking: Reported on 10/28/2019) 30 each 1 Not Taking at Unknown time  . neomycin-bacitracin-polymyxin (NEOSPORIN) OINT Apply 1 application topically 2 (two) times daily. (Patient not taking: Reported on 10/28/2019) 56 g 1 Not Taking at Unknown time  . Nutritional Supplements (FEEDING SUPPLEMENT, NEPRO CARB  STEADY,) LIQD Take 237 mLs by mouth 2 (two) times daily between meals. (Patient not taking: Reported on 10/28/2019) 237 mL 30 Not Taking at Unknown time  . sevelamer carbonate (RENVELA) 800 MG tablet Take 2 tablets (1,600 mg total) by mouth 3 (three) times daily with meals. 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  Patient's is in critical status with multiorgan failure, poor prognosis, and he has high risk for subsequent cardiac arrest.  Family in agreement for continuing aggressive interventions for the next 24 to 48 hours with reassessment.    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 SRA and heparin antibody pending.    Goal of Therapy:  Monitor platelets by anticoagulation protocol: Yes  Target aPTT 50-90 seconds  Argatroban initiated at 0.5 mcg/kg/min for ICU patients per protocol.   0106 @ 0842 aPTT 48 at baseline (but drawn during 1.4 mL/hr infusion)  0106 @ 1137 aPTT 113.  Per protocol, decreased infusion by 30%.  Adjusted to 0.98 mL/hr (0.35 mcgkg/min).  Per nurse, pump actually set to 1 mL/hr, as pump cannot be programmed at 0.98 mL/hr).  Plan:   0106 @ 1443 aPTT 107: due to CRRT therapy aPTT times are being moved to every 4 hours   Decrease infusion by approximately 20% (this is more conservative than the protocol suggests due to the previous testing interval of 2 hours)    Will lower to 0.3 mcg/kg/min  0106 @ 2206 aPTT 92 which is just barely above goal therefore will continue current rate and recheck aPTT in 4 hrs to reevaluate.  Will order CBC with AM labs.  Ena Dawley, PharmD 10/28/2019 10:38 PM

## 2019-10-28 NOTE — Progress Notes (Signed)
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  Patient Measurements: Height: 5\' 1"  (154.9 cm) Weight: 103 lb 2.8 oz (46.8 kg) IBW/kg (Calculated) : 52.3 HEPARIN DW (KG): 46.8  Vital Signs: Temp: 97.6 F (36.4 C) (01/06 0750) Temp Source: Axillary (01/06 0750) BP: 127/94 (01/06 0200) Pulse Rate: 70 (01/06 1100)  Labs: Recent Labs    10/27/19 1307 10/27/19 1307 10/27/19 1449 10/27/19 1823 10/27/19 2152 10/28/19 0045 10/28/19 0301 10/28/19 0445 10/28/19 0756 10/28/19 0842 10/28/19 1137  HGB 9.7*  --   --  9.0*  --   --   --  7.0* 7.3*  --   --   HCT 31.6*  --   --  31.5*  --   --   --  21.7* 22.4*  --   --   PLT 133*  --   --  71*  --   --   --  49* 50*  --   --   APTT  --   --  36  --   --   --   --   --   --  48* 113*  LABPROT  --   --  22.0*  --   --   --   --  26.3*  --  21.5*  --   INR  --   --  1.9*  --   --   --   --  2.4*  --  1.9*  --   CREATININE 6.34*  --   --  5.18*  --   --   --  5.80*  --   --   --   TROPONINIHS 63*   < > 65* 63* 147* 227* 301*  --   --   --   --    < > = values in this interval not displayed.    Estimated Creatinine Clearance: 10.6 mL/min (A) (by C-G formula based on SCr of 5.8 mg/dL (H)).  Medical History: Past Medical History:  Diagnosis Date  . ESRD (end stage renal disease) ([place])   . Hypertension   . PAD (peripheral artery disease) ([facility])    Required aortofemoral stent-which had closed and had to redo the procedure and ischemia of limb.  . Peripheral vascular disease ([place])   . Renal disorder   . Secondary hyperparathyroidism of renal origin [facility])     Medications:  Medications Prior to Admission  Medication Sig Dispense Refill Last Dose  . amLODipine (NORVASC) 5 MG tablet Take 1 tablet (5 mg total) by mouth daily. 30 tablet 3   . apixaban (ELIQUIS) 2.5 MG TABS tablet Take 1 tablet (2.5 mg total) by mouth 2 (two) times daily. 60 tablet 1   . atorvastatin (LIPITOR) 80 MG tablet Take 1  tablet (80 mg total) by mouth at bedtime. 30 tablet 1   . b complex-C-folic acid 1 MG capsule Take 1 capsule by mouth daily after supper.      . carvedilol (COREG) 25 MG tablet Take 1 tablet (25 mg total) by mouth 2 (two) times daily with a meal. 60 tablet 1   . carvedilol (COREG) 25 MG tablet Take 1 tablet (25 mg total) by mouth 2 (two) times daily with a meal. 60 tablet 0   . gabapentin (NEURONTIN) 100 MG capsule Take 1 capsule (100 mg total) by mouth 3 (three) times daily. 90 capsule 1   . hydrALAZINE (APRESOLINE) 25 MG tablet Take 1 tablet (25 mg total) by mouth every 8 (  eight) hours. 90 tablet 1   . hydrOXYzine (ATARAX/VISTARIL) 25 MG tablet Take 25 mg by mouth 3 (three) times daily as needed for anxiety.     . levETIRAcetam (KEPPRA) 500 MG tablet Take 1 tablet (500 mg total) by mouth 2 (two) times daily. 60 tablet 0   . loratadine (CLARITIN) 10 MG tablet Take 1 tablet (10 mg total) by mouth daily. 30 tablet 0   . multivitamin (RENA-VIT) TABS tablet Take 1 tablet by mouth daily. 30 each 1   . neomycin-bacitracin-polymyxin (NEOSPORIN) OINT Apply 1 application topically 2 (two) times daily. (Patient taking differently: Apply 1 application topically as needed. ) 56 g 1   . Nutritional Supplements (FEEDING SUPPLEMENT, NEPRO CARB STEADY,) LIQD Take 237 mLs by mouth 2 (two) times daily between meals. 237 mL 30   . sevelamer carbonate (RENVELA) 800 MG tablet Take 2 tablets (1,600 mg total) by mouth 3 (three) times daily with meals. 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  Patient's is in critical status with multiorgan failure, poor prognosis, and he has high risk for subsequent cardiac arrest.  Family in agreement for continuing aggressive interventions for the next 24 to 48 hours with reassessment.    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 SRA and heparin antibody pending.    Goal of Therapy:  Monitor  platelets by anticoagulation protocol: Yes  Target aPTT 50-90 seconds  Argatroban initiated at 0.5 mcg/kg/min for ICU patients per protocol.   0106 @ 0842 aPTT 48 at baseline (but drawn during 1.4 mL/hr infusion)  0106 @ 1137 aPTT 113.  Per protocol, decreased infusion by 30%.  Adjusted to 0.98 mL/hr (0.35 mcgkg/min).  Per nurse, pump actually set to 1 mL/hr, as pump cannot be programmed at 0.98 mL/hr).  Plan:   0106 @ [id number] aPTT 107: due to CRRT therapy aPTT times are being moved to every 4 hours   Decrease infusion by approximately 20% (this is more conservative than the protocol suggests due to the previous testing interval of 2 hours)    Will lower to 0.3 mcg/kg/min  Check aPTT in 4 hours after rate change   Will order CBC with AM labs.  Gerald Dexter, PharmD Pharmacy Resident  10/28/2019 4:08 PM

## 2019-10-28 NOTE — Procedures (Signed)
Patient Name: Brett Small  MRN: [id number]  Epilepsy Attending: Lora Havens  Referring Physician/Provider: Darel Hong, NP Date: 10/27/2018 Duration: 27.57 minutes  Patient history: 44 year old male with history of seizure disorder now status post cardiac arrest.  EEG to evaluate for seizures  Level of alertness: Comatose/sedated  AEDs during EEG study: None  Technical aspects: This EEG study was done with scalp electrodes positioned according to the 10-20 International system of electrode placement. Electrical activity was acquired at a sampling rate of 500Hz  and reviewed with a high frequency filter of 70Hz  and a low frequency filter of 1Hz . EEG data were recorded continuously and digitally stored.   Description: EEG showed continuous generalized, maximal bitemporal 3 to 6 Hz theta-delta slowing.  EEG was reactive to noxious stimuli.  Hyperventilation photic summation were not performed.  Abnormality -Continuous slow, generalized, maximal bitemporal  IMPRESSION: This study is suggestive of nonspecific bitemporal cortical dysfunction as well as severe diffuse encephalopathy, nonspecific to etiology but could be secondary to sedation. No seizures or epileptiform discharges were seen throughout the recording.     Tatiyana Foucher Barbra Sarks

## 2019-10-28 NOTE — Consult Note (Signed)
[facility] Vascular Consult Note  MRN : [id number]  Brett Small is a 44 y.o. (19-Sep-1976) male who presents with chief complaint of  Chief Complaint  Patient presents with  . Emesis   History of Present Illness:  The patient is a 44 year old male with multiple medical issues well-known to our service who presented to the [facility] emergency department with a chief complaint of progressively worsening generalized weakness.  Patient is currently being maintained by a right IJ PermCath.  Patient did have a right upper extremity graft however this was removed about 2 months ago due to sepsis.  No issues with the functioning of his PermCath at this time.    Patient endorses a history of progressively worsening shortness of breath and cough which prompted him to seek medical attention in our emergency department.  Patient also notes intractable nausea and nonbilious vomiting.  Denies fever. He noted poor p.o. intake over the last few weeks.  COVID / influenza negative.  Patient continues to smoke cigarettes and marijuana.  Patient admitted for sepsis.  Unfortunately, the patient began to decompensate in the emergency department. Around 5:30pm on 10/27/19, he was noted to have agonal respirations and diaphoresis.  No pulse was noted a code was called and CPR was started. PEA on monitor. Patient was intubated, ROSC obtained and he was transferred to the ICU. CRRT started. Patient made DNR by family.   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  Vascular Surgery consulted for right upper extremity ischemia.   Current Facility-Administered Medications  Medication Dose Route Frequency Provider Last Rate Last Admin  .  prismasol BGK 4/2.5 infusion   CRRT Continuous Holley Raring, Munsoor, MD 400 mL/hr at 10/28/19 0420 New Bag at 10/28/19 0420  .  prismasol BGK 4/2.5 infusion   CRRT Continuous Holley Raring, Munsoor, MD 400 mL/hr at 10/28/19 0420 New  Bag at 10/28/19 0420  . 0.9 %  sodium chloride infusion (Manually program via Guardrails IV Fluids)   Intravenous Once Darel Hong D, NP      . acetaminophen (TYLENOL) tablet 650 mg  650 mg Oral Q6H PRN Nicole Kindred A, DO       Or  . acetaminophen (TYLENOL) suppository 650 mg  650 mg Rectal Q6H PRN Nicole Kindred A, DO      . anticoagulant sodium citrate solution 5 mL  5 mL Intravenous PRN Kolluru, Sarath, MD      . argatroban 1 mg/mL infusion  0.35 mcg/kg/min Intravenous Continuous Gerald Dexter, RPH 0.98 mL/hr at 10/28/19 1300 0.35 mcg/kg/min at 10/28/19 1300  . B-complex with vitamin C tablet 1 tablet  1 tablet Per Tube QHS Tyler Pita, MD      . bisacodyl (DULCOLAX) EC tablet 5 mg  5 mg Oral Daily PRN Nicole Kindred A, DO      . calcium gluconate 1 g/ 50 mL sodium chloride IVPB  1 g Intravenous Once Gregor Hams, MD      . calcium gluconate 1 g/ 50 mL sodium chloride IVPB  1 g Intravenous Once Nicole Kindred A, DO      . ceFEPIme (MAXIPIME) 2 g in sodium chloride 0.9 % 100 mL IVPB  2 g Intravenous Q12H Gerald Dexter, RPH 200 mL/hr at 10/28/19 1024 2 g at 10/28/19 1024  . chlorhexidine gluconate (MEDLINE KIT) (PERIDEX) 0.12 % solution 15 mL  15 mL Mouth Rinse BID Flora Lipps, MD   15 mL at 10/28/19 0755  .  Chlorhexidine Gluconate Cloth 2 % PADS 6 each  6 each Topical Daily [name], NP   6 each at 10/28/19 1102  . dexmedetomidine (PRECEDEX) 200 MCG/50ML (4 mcg/mL) infusion  0.4-1.2 mcg/kg/hr Intravenous Titrated Nicole Kindred A, DO 9.36 mL/hr at 10/28/19 0943 0.8 mcg/kg/hr at 10/28/19 0943  . doxycycline (VIBRAMYCIN) 200 mg in dextrose 5 % 250 mL IVPB  200 mg Intravenous Q12H Tyler Pita, MD      . feeding supplement (PRO-STAT SUGAR FREE 64) liquid 30 mL  30 mL Per Tube Daily Tyler Pita, MD   30 mL at 10/28/19 1313  . feeding supplement (VITAL AF 1.2 CAL) liquid 1,000 mL  1,000 mL Per Tube Continuous Tyler Pita, MD 45 mL/hr at  10/28/19 1313 1,000 mL at 10/28/19 1313  . fentaNYL (SUBLIMAZE) injection 100 mcg  100 mcg Intravenous Q1H PRN Flora Lipps, MD   100 mcg at 10/27/19 1905  . hydrocortisone sodium succinate (SOLU-CORTEF) 100 MG injection 50 mg  50 mg Intravenous Q6H Darel Hong D, NP   50 mg at 10/28/19 1500  . LORazepam (ATIVAN) injection 2-4 mg  2-4 mg Intravenous Q4H PRN Flora Lipps, MD   4 mg at 10/27/19 1905  . magnesium citrate solution 1 Bottle  1 Bottle Oral Once PRN Nicole Kindred A, DO      . MEDLINE mouth rinse  15 mL Mouth Rinse 10 times per day Flora Lipps, MD   15 mL at 10/28/19 1501  . norepinephrine (LEVOPHED) 16 mg in 214m premix infusion  16 mcg/min Intravenous Titrated KBradly Bienenstock NP   Stopped at 10/27/19 2251  . ondansetron (ZOFRAN) tablet 4 mg  4 mg Oral Q6H PRN GNicole KindredA, DO       Or  . ondansetron (ZOFRAN) injection 4 mg  4 mg Intravenous Q6H PRN GNicole KindredA, DO   4 mg at 10/27/19 1656  . pantoprazole sodium (PROTONIX) 40 mg/20 mL oral suspension 40 mg  40 mg Per Tube QHS GTyler Pita MD      . patiromer ([facility] packet 8.4 g  8.4 g Oral Daily BGregor Hams MD   8.4 g at 10/28/19 1500  . phenylephrine (NEO-SYNEPHRINE) 10 mg in sodium chloride 0.9 % 250 mL (0.04 mg/mL) infusion  0-400 mcg/min Intravenous Titrated KFlora Lipps MD      . polyethylene glycol (MIRALAX / GLYCOLAX) packet 17 g  17 g Oral Daily PRN GNicole KindredA, DO      . prismasol BGK 4/2.5 infusion   CRRT Continuous LHolley Raring Munsoor, MD 1,000 mL/hr at 10/28/19 1443 New Bag at 10/28/19 1443  . traMADol (ULTRAM) tablet 50 mg  50 mg Oral Q12H PRN GNicole KindredA, DO      . vancomycin (VANCOREADY) IVPB 500 mg/100 mL  500 mg Intravenous Q24H MGerald Dexter RPH 100 mL/hr at 10/28/19 1310 500 mg at 10/28/19 1310  . vasopressin (PITRESSIN) 40 Units in sodium chloride 0.9 % 250 mL (0.16 Units/mL) infusion  0.03 Units/min Intravenous Continuous KBradly Bienenstock NP   Stopped at  10/27/19 2209  . vecuronium (NORCURON) injection 10 mg  10 mg Intravenous Q1H PRN KFlora Lipps MD   10 mg at 10/27/19 1906   Past Medical History:  Diagnosis Date  . ESRD (end stage renal disease) ([facility]   . Hypertension   . PAD (peripheral artery disease) (HCC)    Required aortofemoral stent-which had closed and had to redo the procedure and ischemia  of limb.  . Peripheral vascular disease ([place])   . Renal disorder   . Secondary hyperparathyroidism of renal origin [facility])    Past Surgical History:  Procedure Laterality Date  . A/V SHUNT INTERVENTION Left 01/19/2019   Procedure: LEFT UPPER EXTREMITY A/V SHUNTOGRAM / UPPER EXTREMITY ANGIOGRAM;  Surgeon: Algernon Huxley, MD;  Location: [place] [facility];  Service: Cardiovascular;  Laterality: Left;  . A/V SHUNT INTERVENTION Left 03/02/2019   Procedure: A/V SHUNT INTERVENTION;  Surgeon: Algernon Huxley, MD;  Location: [place] [facility];  Service: Cardiovascular;  Laterality: Left;  . AORTA - FEMORAL ARTERY BYPASS GRAFT    . AV FISTULA PLACEMENT Left 12/26/2018   Procedure: INSERTION OF GORE STRETCH VASCULAR 4-7MM X  45CM IN LEFT UPPER ARM;  Surgeon: Marty Heck, MD;  Location: [place];  Service: Vascular;  Laterality: Left;  . AV FISTULA PLACEMENT Right 08/12/2019   Procedure: INSERTION OF ARTERIOVENOUS (AV) GORE-TEX GRAFT ARM;  Surgeon: Algernon Huxley, MD;  Location: [facility];  Service: Vascular;  Laterality: Right;  . DIALYSIS/PERMA CATHETER REMOVAL N/A 02/06/2019   Procedure: DIALYSIS/PERMA CATHETER REMOVAL;  Surgeon: Algernon Huxley, MD;  Location: [place] [facility];  Service: Cardiovascular;  Laterality: N/A;  . ESOPHAGOGASTRODUODENOSCOPY N/A 10/07/2019   Procedure: ESOPHAGOGASTRODUODENOSCOPY (EGD);  Surgeon: Lin Landsman, MD;  Location: [facility];  Service: Gastroenterology;  Laterality: N/A;  . LEFT HEART CATH AND CORONARY ANGIOGRAPHY Right 07/10/2019   Procedure: LEFT HEART CATH AND CORONARY ANGIOGRAPHY;  Surgeon: Dionisio David, MD;  Location: [place] [facility];  Service: Cardiovascular;  Laterality: Right;  . PERIPHERAL VASCULAR THROMBECTOMY Left 07/28/2019   Procedure: Left Upper Extremity Dialysis Access Decl[place];  Surgeon: Katha Cabal, MD;  Location: [place] [facility];  Service: Cardiovascular;  Laterality: Left;  . REMOVAL OF A DIALYSIS CATHETER Right 08/26/2019   Procedure: REMOVAL OF A DIALYSIS CATHETER;  Surgeon: Algernon Huxley, MD;  Location: [facility];  Service: Vascular;  Laterality: Right;  . TEMPORARY DIALYSIS CATHETER N/A 08/25/2019   Procedure: TEMPORARY DIALYSIS CATHETER;  Surgeon: Katha Cabal, MD;  Location: [place] [facility];  Service: Cardiovascular;  Laterality: N/A;  . UPPER EXTREMITY ANGIOGRAPHY Left 08/13/2019   Procedure: UPPER EXTREMITY ANGIOGRAPHy;  Surgeon: Algernon Huxley, MD;  Location: [place] [facility];  Service: Cardiovascular;  Laterality: Left;   Social History Social History   Tobacco Use  . Smoking status: Current Every Day Smoker    Packs/day: 0.50    Years: 30.00    Pack years: 15.00    Types: Cigarettes    Last attempt to quit: 06/26/2018    Years since quitting: 1.3  . Smokeless tobacco: Never Used  . Tobacco comment: smoked for 30 years   Substance Use Topics  . Alcohol use: Not Currently  . Drug use: Yes    Frequency: 1.0 times per week    Types: Marijuana    Comment: Pt states "maybe once or twice a week".    Family History Family History  Problem Relation Age of Onset  . Hypertension Other   . Diabetes Other   . Clotting disorder Father   Positive for clotting disorder.  Negative for peripheral artery disease and renal disease.  Allergies  Allergen Reactions  . Codeine Nausea Only    Patient questioned this (??)  . Sulfa Antibiotics Hives and Nausea And Vomiting  . Heparin     Heparin antibody pending, SRA pending   REVIEW OF  SYSTEMS (Negative unless checked)  Constitutional: _0 Weight loss  _1 Fever  _2 Chills Cardiac: _3 Chest  pain   _4 Chest pressure   _5 Palpitations   _6 Shortness of breath when laying flat   _7 Shortness of breath at rest   _8 Shortness of breath with exertion. Vascular:  _9 Pain in legs with walking   _10 Pain in legs at rest   _11 Pain in legs when laying flat   _12 Claudication   _13 Pain in feet when walking  _14 Pain in feet at rest  _15 Pain in feet when laying flat   _16 History of DVT   _17 Phlebitis   _18 Swelling in legs   _19 Varicose veins   _20 Non-healing ulcers Pulmonary:   _21 Uses home oxygen   _22 Productive cough   _23 Hemoptysis   _24 Wheeze  _25 COPD   _26 Asthma Neurologic:  _27 Dizziness  _28 Blackouts   _29 Seizures   _30 History of stroke   _31 History of TIA  _32 Aphasia   _33 Temporary blindness   _34 Dysphagia   _35 Weakness or numbness in arms   _36 Weakness or numbness in legs Musculoskeletal:  _37 Arthritis   _38 Joint swelling   _39 Joint pain   _40 Low back pain Hematologic:  _41 Easy bruising  _42 Easy bleeding   _43 Hypercoagulable state   _44 Anemic  _45 Hepatitis Gastrointestinal:  _46 Blood in stool   _47 Vomiting blood  _48 Gastroesophageal reflux/heartburn   _49 Difficulty swallowing. Genitourinary:  _50 Chronic kidney disease   _51 Difficult urination  _52 Frequent urination  _53 Burning with urination   _54 Blood in urine Skin:  _55 Rashes   _56 Ulcers   _57 Wounds Psychological:  _58 History of anxiety   _59  History of major depression.  Physical Examination  Vitals:   10/28/19 1200 10/28/19 1300 10/28/19 1400 10/28/19 1500  BP:      Pulse: 69 67 67 66  Resp: 15 (!) 0 17 15  Temp:  (!) 97.5 F (36.4 C)    TempSrc:  Axillary    SpO2: 100% 100% 100% 100%  Weight:      Height:       Body mass index is 19.49 kg/m. Gen:  Intubated and sedated, NAD Head: [place]/AT, No temporalis wasting. Prominent temp pulse not noted. Ear/Nose/Throat: Hearing grossly intact, nares w/o erythema or drainage, oropharynx w/o Erythema/Exudate Eyes: Sclera non-icteric, conjunctiva clear Neck: Trachea midline.  No JVD.  Chest:   Right IJ permcath: Intact.  No signs of  infection. Pulmonary:  On vent. Good air movement, respirations not labored, equal bilaterally.  Cardiac: RRR, normal S1, S2. Vascular:  Vessel Right Left  Radial Non-Palpable Palpable  Ulnar Non-Palpable Palpable  Brachial Palpable Palpable                           Right Upper Extremity:   Upper arm soft.  Forearm soft.  Mild appearance/bluish discoloration starting at the wrist extending distally to the fingers.  Hand is cold.  Unable to palpate radial ulnar pulses.  Gastrointestinal: soft, non-tender/non-distended. No guarding/reflex.  Musculoskeletal: M/S 5/5 throughout.  Extremities without ischemic changes.  No deformity or atrophy. Mild edema. Neurologic: Sensation grossly intact in extremities.  Symmetrical.  Speech is fluent. Motor exam as listed above. Psychiatric: Sedated on vent.  Dermatologic: No rashes or ulcers noted.  No cellulitis or open wounds. Lymph : No Cervical, Axillary, or Inguinal lymphadenopathy.  CBC Lab Results  Component Value Date   WBC 8.7 10/28/2019   HGB 7.3 (L) 10/28/2019   HCT 22.4 (L) 10/28/2019   MCV 91.8 10/28/2019   PLT 50 (L) 10/28/2019   BMET    Component Value Date/Time  NA 132 (L) 10/28/2019 0445   K 4.7 10/28/2019 0445   CL 87 (L) 10/28/2019 0445   CO2 30 10/28/2019 0445   GLUCOSE 125 (H) 10/28/2019 0445   BUN 72 (H) 10/28/2019 0445   CREATININE 5.80 (H) 10/28/2019 0445   CALCIUM 8.2 (L) 10/28/2019 0445   GFRNONAA 11 (L) 10/28/2019 0445   GFRAA 13 (L) 10/28/2019 0445   Estimated Creatinine Clearance: 10.6 mL/min (A) (by C-G formula based on SCr of 5.8 mg/dL (H)).  COAG Lab Results  Component Value Date   INR 1.9 (H) 10/28/2019   INR 2.4 (H) 10/28/2019   INR 1.9 (H) 10/27/2019   Radiology EEG  Result Date: 10/28/2019 Lora Havens, MD     10/28/2019  3:18 PM Patient Name: Brett Small MRN: [id number] Epilepsy Attending: Lora Havens Referring Physician/Provider: Darel Hong, NP Date: 10/27/2018 Duration:  27.57 minutes Patient history: 44 year old male with history of seizure disorder now status post cardiac arrest.  EEG to evaluate for seizures Level of alertness: Comatose/sedated AEDs during EEG study: None Technical aspects: This EEG study was done with scalp electrodes positioned according to the 10-20 International system of electrode placement. Electrical activity was acquired at a sampling rate of _0  and reviewed with a high frequency filter of _1  and a low frequency filter of _2 . EEG data were recorded continuously and digitally stored. Description: EEG showed continuous generalized, maximal bitemporal 3 to 6 Hz theta-delta slowing.  EEG was reactive to noxious stimuli.  Hyperventilation photic summation were not performed. Abnormality -Continuous slow, generalized, maximal bitemporal IMPRESSION: This study is suggestive of nonspecific bitemporal cortical dysfunction as well as severe diffuse encephalopathy, nonspecific to etiology but could be secondary to sedation. No seizures or epileptiform discharges were seen throughout the recording. Lora Havens   DG Chest 1 View  Result Date: 10/27/2019 CLINICAL DATA:  Difficult intubation status post CPR EXAM: CHEST  1 VIEW COMPARISON:  October 27, 2019 at 12:19 p.m. FINDINGS: The heart size remains enlarged. There is a small to moderate-sized left-sided pleural effusion. There is a small right-sided pleural effusion. There is diffuse increased attenuation within the left lung field. Prominent interstitial lung markings are noted. There is a stable tunneled dialysis catheter on the right. The endotracheal tube terminates approximately 3 cm above the carina. There is no pneumothorax. IMPRESSION: 1. Lines and tubes as above. 2. Persistent bilateral pleural effusions, left greater than right. 3. Worsening left-sided airspace disease concerning for pneumonia. Electronically Signed   By: Constance Holster M.D.   On: 10/27/2019 20:07   DG Chest 2  View  Result Date: 10/27/2019 CLINICAL DATA:  44 year old male with history of chest pain. Vomiting for the past 2 weeks. Diarrhea since yesterday. EXAM: CHEST - 2 VIEW COMPARISON:  Chest x-ray 10/03/2019. FINDINGS: Right internal jugular PermCath with tips terminating in the right atrium and superior cavoatrial junction. Left upper extremity vascular stent extending into the axillary region. Lung volumes are slightly low. Opacity at the left base may reflect atelectasis and/or consolidation. Small to moderate left pleural effusion, slightly decreased compared to the prior study. Right lung is clear. No right pleural effusion. No pneumothorax. No definite suspicious appearing pulmonary nodules or masses. No evidence of pulmonary edema. Mild cardiomegaly. Upper mediastinal contours are within normal limits. IMPRESSION: 1. Support apparatus and postoperative changes, as above. 2. Left lower lobe atelectasis and/or consolidation with superimposed small to moderate left pleural effusion, slightly decreased compared to the prior study. Electronically Signed   By: Quillian Quince  Entrikin M.D.   On: 10/27/2019 12:36   DG Chest 2 View  Result Date: 10/03/2019 CLINICAL DATA:  Chest pressure since yesterday. States has had cough for one week. Was tested for COVID yesterday but does not have result. DIalysis patient with permacath R upper chest. Hx - ESRD, HTN, AAA, current smoker 0.5 ppd. EXAM: CHEST - 2 VIEW COMPARISON:  08/25/2019.  CT, 07/20/2019. FINDINGS: Cardiac silhouette is mildly enlarged. No mediastinal or hilar masses. Right internal jugular tunneled dual lumen central venous catheter is stable, distal tip in the right atrium. Moderate left pleural effusion associated with dependent left lung base opacity, the latter most likely atelectasis although pneumonia is possible. Lungs show prominent bronchovascular markings bilaterally, but are otherwise clear. No pulmonary edema. No pneumothorax. Skeletal structures are  grossly intact. Stable left axillary to upper extremity vascular stent. IMPRESSION: 1. Moderate right pleural effusion, which appears decreased compared to the prior chest radiograph. 2. Left lung base opacity, which is most likely atelectasis. Pneumonia is not excluded but felt less likely. 3. No evidence of pulmonary edema.  Mild cardiomegaly. Electronically Signed   By: Lajean Manes M.D.   On: 10/03/2019 12:58   DG Ankle Complete Right  Result Date: 10/27/2019 CLINICAL DATA:  Chronic wound with cellulitis EXAM: RIGHT ANKLE - COMPLETE 3+ VIEW COMPARISON:  None. FINDINGS: Frontal, oblique, and lateral views obtained. There is evidence of old trauma involving the calcaneus with remodeling. No acute fracture or joint effusion is evident. There is narrowing of the posterior aspect of the ankle joint. There is arthropathy throughout the subtalar joint regions. No frank erosion or bony destruction evident. IMPRESSION: Evidence of old trauma involving the calcaneus. Arthropathy throughout the subtalar joints. There is narrowing of the posterior ankle joint. No frank bony destruction appreciated by radiography. No acute fracture or joint effusion evident. If there remains concern for osteomyelitis, MR or nuclear medicine three-phase bone scan could be helpful for further assessment. Electronically Signed   By: Lowella Grip III M.D.   On: 10/27/2019 13:09   DG Abd 1 View  Result Date: 10/28/2019 CLINICAL DATA:  OG tube placement. EXAM: ABDOMEN - 1 VIEW COMPARISON:  Single-view of the abdomen 10/04/2019. FINDINGS: OG tube is in place with the tip in the distal stomach. IMPRESSION: As above. Electronically Signed   By: Inge Rise M.D.   On: 10/28/2019 11:56   DG Abd 1 View  Result Date: 10/04/2019 CLINICAL DATA:  NG tube placement EXAM: ABDOMEN - 1 VIEW COMPARISON:  10/04/2019 FINDINGS: Interval placement of esophagogastric tube, tip near the gastroesophageal junction and side port above the diaphragm.  Recommend advancement to ensure subdiaphragmatic positioning of tip and side port. Nonobstructive pattern of bowel gas in the included abdomen. No obvious free air. Stent projects over the central abdomen. Cardiomegaly. IMPRESSION: Interval placement of esophagogastric tube, tip near the gastroesophageal junction and side port above the diaphragm. Recommend advancement to ensure subdiaphragmatic positioning of tip and side port. Electronically Signed   By: Eddie Candle M.D.   On: 10/04/2019 17:10   CT HEAD WO CONTRAST  Result Date: 10/28/2019 CLINICAL DATA:  Cardiac arrest. EXAM: CT HEAD WITHOUT CONTRAST TECHNIQUE: Contiguous axial images were obtained from the base of the skull through the vertex without intravenous contrast. COMPARISON:  None. FINDINGS: Brain: There is no mass, hemorrhage or extra-axial collection. The size and configuration of the ventricles and extra-axial CSF spaces are normal. The brain parenchyma is normal, without acute or chronic infarction. Vascular: No abnormal hyperdensity  of the major intracranial arteries or dural venous sinuses. No intracranial atherosclerosis. Skull: The visualized skull base, calvarium and extracranial soft tissues are normal. Sinuses/Orbits: No fluid levels or advanced mucosal thickening of the visualized paranasal sinuses. No mastoid or middle ear effusion. The orbits are normal. IMPRESSION: Normal head CT. Electronically Signed   By: Ulyses Jarred M.D.   On: 10/28/2019 01:50   US Venous Img Upper Uni Right(DVT)  Result Date: 10/28/2019 CLINICAL DATA:  Dialysis patient, right arm graft removed, skin changes and swelling. EXAM: RIGHT UPPER EXTREMITY VENOUS DOPPLER ULTRASOUND TECHNIQUE: Gray-scale sonography with graded compression, as well as color Doppler and duplex ultrasound were performed to evaluate the upper extremity deep venous system from the level of the subclavian vein and including the jugular, axillary, basilic, radial, ulnar and upper cephalic  vein. Spectral Doppler was utilized to evaluate flow at rest and with distal augmentation maneuvers. COMPARISON:  None. FINDINGS: Contralateral Subclavian Vein: Respiratory phasicity is normal and symmetric with the symptomatic side. No evidence of thrombus. Normal compressibility. Internal Jugular Vein: No evidence of thrombus. Normal compressibility, respiratory phasicity and response to augmentation. Subclavian Vein: No evidence of thrombus. Normal compressibility, respiratory phasicity and response to augmentation. Axillary Vein: No evidence of thrombus. Normal compressibility, respiratory phasicity and response to augmentation. Cephalic Vein: No evidence of thrombus. Normal compressibility, respiratory phasicity and response to augmentation. Basilic Vein: No evidence of thrombus. Normal compressibility, respiratory phasicity and response to augmentation. Brachial Veins: No evidence of thrombus. Normal compressibility, respiratory phasicity and response to augmentation. Radial Veins: The forearm radial vein demonstrates hypoechoic thrombus appearing occlusive. Vein noncompressible. Minor thrombus burden without propagation into the brachial vein of the upper arm. Ulnar Veins: No evidence of thrombus. Normal compressibility, respiratory phasicity and response to augmentation. IMPRESSION: Right forearm radial vein DVT. Very low thrombus burden. No propagation into the brachial vein. Electronically Signed   By: Jerilynn Mages.  Shick M.D.   On: 10/28/2019 10:31   DG Chest Port 1 View  Result Date: 10/28/2019 CLINICAL DATA:  Acute respiratory failure EXAM: PORTABLE CHEST 1 VIEW COMPARISON:  Radiograph 10/27/2019 FINDINGS: Dual lumen right IJ approach dialysis catheter tip terminates at the level of the right atrium and superior cavoatrial junction. An endotracheal tube terminates low in the trachea, approximately 1 cm from the carina. Pacer pad overlies the left chest wall. Additional support devices project over the chest.  Left axillary vascular stent is again seen. Lung volumes remain diminished with bilateral patchy airspace opacities similar on the left and slightly increasing on the right when compared to prior imaging. No pneumothorax. Suspect left pleural effusion. No visible right effusion. No acute osseous or soft tissue abnormality. IMPRESSION: 1. Endotracheal tube terminates low in the trachea, approximately 1 cm from the carina. Consider retraction 2-3 cm to position in the mid trachea. 2. Bilateral patchy airspace opacities similar on the left and slightly increasing on the right when compared to prior imaging. Worrisome for infectious consolidation versus edema. 3. Left pleural effusion. These results will be called to the ordering clinician or representative by the Radiologist Assistant, and communication documented in the PACS or zVision Dashboard. Electronically Signed   By: Lovena Le M.D.   On: 10/28/2019 03:13   DG Abd 2 Views  Result Date: 10/04/2019 CLINICAL DATA:  Vomiting. EXAM: ABDOMEN - 2 VIEW COMPARISON:  08/25/2019 FINDINGS: There is no bowel dilation to suggest obstruction. No air-fluid levels or free air. Vascular stent overlies the upper to mid lumbar spine. A right common iliac  stent is present. These are stable from prior study. There are surgical vascular clips in the central abdomen and left lower quadrant. These are also unchanged. No evidence of renal or ureteral stones. There is opacity at the left lung base, incompletely imaged. Skeletal structures unremarkable. IMPRESSION: 1. No evidence of bowel obstruction or free air.  No acute findings. Electronically Signed   By: Lajean Manes M.D.   On: 10/04/2019 16:11   Assessment/Plan The patient is a 44 year old male with multiple medical issues well-known to our service who presented to the [facility] emergency department with a chief complaint of progressively worsening generalized weakness.  Unfortunately, the  patient decompensated and subsequently went into PEA arrest.  CPR was performed, he was intubated and ROSC obtained.  1.  Right upper extremity ischemia:  Combination of hypoperfusion from sepsis, recent code, and severe PAD. Unfortunately, patient is acutely ill and will not tolerate revascularization. If stabilizes would consider angiogram in the future however patient is at high risk for forearm amputation at a minimum.   2. Right Upper Extremity DVT: Not the cause of hypoperfusion to right hand. No indication for thrombolysis / thrombectomy.    RUE Venous Duplex (10/28/19):  Right forearm radial vein DVT. Very low thrombus burden. No propagation into the brachial vein  3.  End-stage renal disease on chronic hemodialysis: The patient is currently being maintained by a right IJ PermCath.  His PermCath seems to be functioning well.  Currently undergoing CRRT.  Patient is being followed by nephrology.   Of note: Sutured permcath to chest. Permcath not in place however cuff is not exposed. If patient becomes stable would recommend exchange.   4. Cardiac Arrest: Patient experienced PEA arrest.  Intubated with ROSC obtained. On vent, pressors, and CRRT. Management by primary ICU team.   Discussed with Dr. Francene Castle, PA-C  10/28/2019 3:53 PM  This note was created with Dragon medical transcription system.  Any error is purely unintentional.

## 2019-10-28 NOTE — Progress Notes (Signed)
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 Codeine Nausea Only    Patient questioned this (??)  . Sulfa Antibiotics Hives and Nausea And Vomiting   Patient Measurements: Height: 5\' 1"  (154.9 cm) Weight: 103 lb 2.8 oz (46.8 kg) IBW/kg (Calculated) : 52.3 HEPARIN DW (KG): 46.8  Vital Signs: Temp: 96.6 F (35.9 C) (01/06 0400) Temp Source: Axillary (01/06 0400) BP: 127/94 (01/06 0200) Pulse Rate: 77 (01/06 0700)  Labs: Recent Labs    10/27/19 1307 10/27/19 1307 10/27/19 1449 10/27/19 1823 10/27/19 2152 10/28/19 0045 10/28/19 0301 10/28/19 0445  HGB 9.7*  --   --  9.0*  --   --   --  7.0*  HCT 31.6*  --   --  31.5*  --   --   --  21.7*  PLT 133*  --   --  71*  --   --   --  49*  APTT  --   --  36  --   --   --   --   --   LABPROT  --   --  22.0*  --   --   --   --  26.3*  INR  --   --  1.9*  --   --   --   --  2.4*  CREATININE 6.34*  --   --  5.18*  --   --   --  5.80*  TROPONINIHS 63*   < > 65* 63* 147* 227* 301*  --    < > = values in this interval not displayed.    Estimated Creatinine Clearance: 10.6 mL/min (A) (by C-G formula based on SCr of 5.8 mg/dL (H)).  Medical History: Past Medical History:  Diagnosis Date  . ESRD (end stage renal disease) ([place])   . Hypertension   . PAD (peripheral artery disease) ([facility])    Required aortofemoral stent-which had closed and had to redo the procedure and ischemia of limb.  . Peripheral vascular disease ([place])   . Renal disorder   . Secondary hyperparathyroidism of renal origin [facility])     Medications:  Medications Prior to Admission  Medication Sig Dispense Refill Last Dose  . amLODipine (NORVASC) 5 MG tablet Take 1 tablet (5 mg total) by mouth daily. 30 tablet 3   . apixaban (ELIQUIS) 2.5 MG TABS tablet Take 1 tablet (2.5 mg total) by mouth 2 (two) times daily. 60 tablet 1   . atorvastatin (LIPITOR) 80 MG tablet Take 1  tablet (80 mg total) by mouth at bedtime. 30 tablet 1   . b complex-C-folic acid 1 MG capsule Take 1 capsule by mouth daily after supper.      . carvedilol (COREG) 25 MG tablet Take 1 tablet (25 mg total) by mouth 2 (two) times daily with a meal. 60 tablet 1   . carvedilol (COREG) 25 MG tablet Take 1 tablet (25 mg total) by mouth 2 (two) times daily with a meal. 60 tablet 0   . gabapentin (NEURONTIN) 100 MG capsule Take 1 capsule (100 mg total) by mouth 3 (three) times daily. 90 capsule 1   . hydrALAZINE (APRESOLINE) 25 MG tablet Take 1 tablet (25 mg total) by mouth every 8 (eight) hours. 90 tablet 1   . hydrOXYzine (ATARAX/VISTARIL) 25 MG tablet Take 25 mg by mouth 3 (three) times daily as needed for anxiety.     . levETIRAcetam (KEPPRA) 500  MG tablet Take 1 tablet (500 mg total) by mouth 2 (two) times daily. 60 tablet 0   . loratadine (CLARITIN) 10 MG tablet Take 1 tablet (10 mg total) by mouth daily. 30 tablet 0   . multivitamin (RENA-VIT) TABS tablet Take 1 tablet by mouth daily. 30 each 1   . neomycin-bacitracin-polymyxin (NEOSPORIN) OINT Apply 1 application topically 2 (two) times daily. (Patient taking differently: Apply 1 application topically as needed. ) 56 g 1   . Nutritional Supplements (FEEDING SUPPLEMENT, NEPRO CARB STEADY,) LIQD Take 237 mLs by mouth 2 (two) times daily between meals. 237 mL 30   . sevelamer carbonate (RENVELA) 800 MG tablet Take 2 tablets (1,600 mg total) by mouth 3 (three) times daily with meals. 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  Patient's critical status with multiorgan failure, poor prognosis, and he has high risk for subsequent cardiac arrest.  Family in agreement for continuing aggressive interventions for the next 24 to 48 hours with reassessment.    Goal of Therapy:  Monitor platelets by anticoagulation protocol: Yes  Target aPTT 50-90 seconds   Plan:  Start Argatroban 0.81mcg/Kg/min Check aPTT in 2  hours  Hart Robinsons A 10/28/2019,7:02 AM

## 2019-10-28 NOTE — Progress Notes (Signed)
[facility], [place] 10/28/19  Subjective:  Patient well-known to [place]. Unfortunately experienced cardiac arrest yesterday. Prior to admission he was having productive cough as well as shortness of breath.  Also reported diarrhea. Patient did not feel well enough for dialysis yesterday therefore came here. Patient then suffered cardiopulmonary arrest x2. Total downtime was 21 minutes.    Objective:  Vital signs in last 24 hours:  Temp:  [96.6 F (35.9 C)-98.6 F (37 C)] 97.6 F (36.4 C) (01/06 0750) Pulse Rate:  [70-127] 70 (01/06 1100) Resp:  [14-26] 16 (01/06 1100) BP: (58-139)/(17-106) 127/94 (01/06 0200) SpO2:  [38 %-100 %] 100 % (01/06 1100) Arterial Line BP: (105-163)/(60-108) 105/60 (01/06 1100) FiO2 (%):  [60 %-100 %] 60 % (01/06 0750) Weight:  [46.8 kg] 46.8 kg (01/06 0500)  Weight change:  Filed Weights   10/27/19 1209 10/28/19 0500  Weight: 46.8 kg 46.8 kg    Intake/Output:    Intake/Output Summary (Last 24 hours) at 10/28/2019 1215 Last data filed at 10/28/2019 1130 Gross per 24 hour  Intake 1836.03 ml  Output 239 ml  Net 1597.03 ml     Physical Exam: General:  Critically ill-appearing  HEENT  endotracheal tube in place  Pulm/lungs  bilateral rales and rhonchi, vent assisted  CVS/Heart  S1S2 no rubs  Abdomen:   Soft, nontender  Extremities:  No lower extremity edema.  Neurologic:  Intubated, sedated  Skin:  No acute rashes  Access:  Right IJ PermCath       Basic Metabolic Panel:  Recent Labs  Lab 10/27/19 1307 10/27/19 1823 10/28/19 0445  NA 131* 131* 132*  K 6.6* 4.8 4.7  CL 89* 99 87*  CO2 18* 11* 30  GLUCOSE 107* 104* 125*  BUN 70* 60* 72*  CREATININE 6.34* 5.18* 5.80*  CALCIUM 9.2 >15.0* 8.2*  MG  --   --  1.8  PHOS  --   --  8.3*     CBC: Recent Labs  Lab 10/27/19 1307 10/27/19 1823 10/28/19 0445 10/28/19 0756  WBC 13.3* 14.3* 9.7 8.7  NEUTROABS  --   --   --  7.7  HGB 9.7* 9.0* 7.0* 7.3*   HCT 31.6* 31.5* 21.7* 22.4*  MCV 94.9 100.0 90.8 91.8  PLT 133* 71* 49* 50*      Lab Results  Component Value Date   HEPBSAG NON REACTIVE 10/03/2019   HEPBSAB Non Reactive 12/25/2018   HEPBIGM NON REACTIVE 10/03/2019      Microbiology:  Recent Results (from the past 240 hour(s))  Blood Culture (routine x 2)     Status: None (Preliminary result)   Collection Time: 10/27/19  1:07 PM   Specimen: BLOOD  Result Value Ref Range Status   Specimen Description BLOOD BLOOD RIGHT HAND  Final   Special Requests   Final    BOTTLES DRAWN AEROBIC AND ANAEROBIC Blood Culture results may not be optimal due to an inadequate volume of blood received in culture bottles   Culture   Final    NO GROWTH < 24 HOURS Performed at [facility], [address] [place], [place] [address]    Report Status PENDING  Incomplete  Blood Culture (routine x 2)     Status: None (Preliminary result)   Collection Time: 10/27/19  1:07 PM   Specimen: BLOOD  Result Value Ref Range Status   Specimen Description BLOOD BLOOD LEFT HAND  Final   Special Requests   Final    BOTTLES DRAWN  AEROBIC ONLY Blood Culture results may not be optimal due to an inadequate volume of blood received in culture bottles   Culture   Final    NO GROWTH < 24 HOURS Performed at [facility], [address] [place], [place] [address]    Report Status PENDING  Incomplete  SARS CORONAVIRUS 2 (TAT 6-24 HRS) Nasopharyngeal Nasopharyngeal Swab     Status: None   Collection Time: 10/27/19  3:54 PM   Specimen: Nasopharyngeal Swab  Result Value Ref Range Status   SARS Coronavirus 2 NEGATIVE NEGATIVE Final    Comment: (NOTE) SARS-CoV-2 target nucleic acids are NOT DETECTED. The SARS-CoV-2 RNA is generally detectable in upper and lower respiratory specimens during the acute phase of infection. Negative results do not preclude SARS-CoV-2 infection, do not rule out co-infections with other pathogens, and should not be used  as the sole basis for treatment or other patient management decisions. Negative results must be combined with clinical observations, patient history, and epidemiological information. The expected result is Negative. Fact Sheet for Patients: SugarRoll.be Fact Sheet for Healthcare Providers: https://www.woods-mathews.com/ This test is not yet approved or cleared by the [place] FDA and  has been authorized for detection and/or diagnosis of SARS-CoV-2 by FDA under an Emergency Use Authorization (EUA). This EUA will remain  in effect (meaning this test can be used) for the duration of the COVID-19 declaration under Section 56 4(b)(1) of the Act, 21 U.S.C. section 360bbb-3(b)(1), unless the authorization is terminated or revoked sooner. Performed at [facility], [address], [place], [place] [address]   MRSA PCR Screening     Status: Abnormal   Collection Time: 10/27/19  9:52 PM   Specimen: Nasopharyngeal  Result Value Ref Range Status   MRSA by PCR POSITIVE (A) NEGATIVE Final    Comment:        The GeneXpert MRSA Assay (FDA approved for NASAL specimens only), is one component of a comprehensive MRSA colonization surveillance program. It is not intended to diagnose MRSA infection nor to guide or monitor treatment for MRSA infections. RESULT CALLED TO, READ BACK BY AND VERIFIED WITH: Shary Key RN [phone] 10/27/19 HNM Performed at [facility], [address] [place], [place] [address]     Coagulation Studies: Recent Labs    10/27/19 1449 10/28/19 0445 10/28/19 0842  LABPROT 22.0* 26.3* 21.5*  INR 1.9* 2.4* 1.9*    Urinalysis: No results for input(s): COLORURINE, LABSPEC, PHURINE, GLUCOSEU, HGBUR, BILIRUBINUR, KETONESUR, PROTEINUR, UROBILINOGEN, NITRITE, LEUKOCYTESUR in the last 72 hours.  Invalid input(s): APPERANCEUR    Imaging: DG Chest 1 View  Result Date: 10/27/2019 CLINICAL DATA:  Difficult  intubation status post CPR EXAM: CHEST  1 VIEW COMPARISON:  October 27, 2019 at 12:19 p.m. FINDINGS: The heart size remains enlarged. There is a small to moderate-sized left-sided pleural effusion. There is a small right-sided pleural effusion. There is diffuse increased attenuation within the left lung field. Prominent interstitial lung markings are noted. There is a stable tunneled dialysis catheter on the right. The endotracheal tube terminates approximately 3 cm above the carina. There is no pneumothorax. IMPRESSION: 1. Lines and tubes as above. 2. Persistent bilateral pleural effusions, left greater than right. 3. Worsening left-sided airspace disease concerning for pneumonia. Electronically Signed   By: Constance Holster M.D.   On: 10/27/2019 20:07   DG Chest 2 View  Result Date: 10/27/2019 CLINICAL DATA:  44 year old male with history of chest pain. Vomiting for the past 2 weeks.  Diarrhea since yesterday. EXAM: CHEST - 2 VIEW COMPARISON:  Chest x-ray 10/03/2019. FINDINGS: Right internal jugular PermCath with tips terminating in the right atrium and superior cavoatrial junction. Left upper extremity vascular stent extending into the axillary region. Lung volumes are slightly low. Opacity at the left base may reflect atelectasis and/or consolidation. Small to moderate left pleural effusion, slightly decreased compared to the prior study. Right lung is clear. No right pleural effusion. No pneumothorax. No definite suspicious appearing pulmonary nodules or masses. No evidence of pulmonary edema. Mild cardiomegaly. Upper mediastinal contours are within normal limits. IMPRESSION: 1. Support apparatus and postoperative changes, as above. 2. Left lower lobe atelectasis and/or consolidation with superimposed small to moderate left pleural effusion, slightly decreased compared to the prior study. Electronically Signed   By: Vinnie Langton M.D.   On: 10/27/2019 12:36   DG Ankle Complete Right  Result Date:  10/27/2019 CLINICAL DATA:  Chronic wound with cellulitis EXAM: RIGHT ANKLE - COMPLETE 3+ VIEW COMPARISON:  None. FINDINGS: Frontal, oblique, and lateral views obtained. There is evidence of old trauma involving the calcaneus with remodeling. No acute fracture or joint effusion is evident. There is narrowing of the posterior aspect of the ankle joint. There is arthropathy throughout the subtalar joint regions. No frank erosion or bony destruction evident. IMPRESSION: Evidence of old trauma involving the calcaneus. Arthropathy throughout the subtalar joints. There is narrowing of the posterior ankle joint. No frank bony destruction appreciated by radiography. No acute fracture or joint effusion evident. If there remains concern for osteomyelitis, MR or nuclear medicine three-phase bone scan could be helpful for further assessment. Electronically Signed   By: Lowella Grip III M.D.   On: 10/27/2019 13:09   DG Abd 1 View  Result Date: 10/28/2019 CLINICAL DATA:  OG tube placement. EXAM: ABDOMEN - 1 VIEW COMPARISON:  Single-view of the abdomen 10/04/2019. FINDINGS: OG tube is in place with the tip in the distal stomach. IMPRESSION: As above. Electronically Signed   By: Inge Rise M.D.   On: 10/28/2019 11:56   CT HEAD WO CONTRAST  Result Date: 10/28/2019 CLINICAL DATA:  Cardiac arrest. EXAM: CT HEAD WITHOUT CONTRAST TECHNIQUE: Contiguous axial images were obtained from the base of the skull through the vertex without intravenous contrast. COMPARISON:  None. FINDINGS: Brain: There is no mass, hemorrhage or extra-axial collection. The size and configuration of the ventricles and extra-axial CSF spaces are normal. The brain parenchyma is normal, without acute or chronic infarction. Vascular: No abnormal hyperdensity of the major intracranial arteries or dural venous sinuses. No intracranial atherosclerosis. Skull: The visualized skull base, calvarium and extracranial soft tissues are normal. Sinuses/Orbits: No  fluid levels or advanced mucosal thickening of the visualized paranasal sinuses. No mastoid or middle ear effusion. The orbits are normal. IMPRESSION: Normal head CT. Electronically Signed   By: Ulyses Jarred M.D.   On: 10/28/2019 01:50   US Venous Img Upper Uni Right(DVT)  Result Date: 10/28/2019 CLINICAL DATA:  Dialysis patient, right arm graft removed, skin changes and swelling. EXAM: RIGHT UPPER EXTREMITY VENOUS DOPPLER ULTRASOUND TECHNIQUE: Gray-scale sonography with graded compression, as well as color Doppler and duplex ultrasound were performed to evaluate the upper extremity deep venous system from the level of the subclavian vein and including the jugular, axillary, basilic, radial, ulnar and upper cephalic vein. Spectral Doppler was utilized to evaluate flow at rest and with distal augmentation maneuvers. COMPARISON:  None. FINDINGS: Contralateral Subclavian Vein: Respiratory phasicity is normal and symmetric with the symptomatic  side. No evidence of thrombus. Normal compressibility. Internal Jugular Vein: No evidence of thrombus. Normal compressibility, respiratory phasicity and response to augmentation. Subclavian Vein: No evidence of thrombus. Normal compressibility, respiratory phasicity and response to augmentation. Axillary Vein: No evidence of thrombus. Normal compressibility, respiratory phasicity and response to augmentation. Cephalic Vein: No evidence of thrombus. Normal compressibility, respiratory phasicity and response to augmentation. Basilic Vein: No evidence of thrombus. Normal compressibility, respiratory phasicity and response to augmentation. Brachial Veins: No evidence of thrombus. Normal compressibility, respiratory phasicity and response to augmentation. Radial Veins: The forearm radial vein demonstrates hypoechoic thrombus appearing occlusive. Vein noncompressible. Minor thrombus burden without propagation into the brachial vein of the upper arm. Ulnar Veins: No evidence of  thrombus. Normal compressibility, respiratory phasicity and response to augmentation. IMPRESSION: Right forearm radial vein DVT. Very low thrombus burden. No propagation into the brachial vein. Electronically Signed   By: Jerilynn Mages.  Shick M.D.   On: 10/28/2019 10:31   DG Chest Port 1 View  Result Date: 10/28/2019 CLINICAL DATA:  Acute respiratory failure EXAM: PORTABLE CHEST 1 VIEW COMPARISON:  Radiograph 10/27/2019 FINDINGS: Dual lumen right IJ approach dialysis catheter tip terminates at the level of the right atrium and superior cavoatrial junction. An endotracheal tube terminates low in the trachea, approximately 1 cm from the carina. Pacer pad overlies the left chest wall. Additional support devices project over the chest. Left axillary vascular stent is again seen. Lung volumes remain diminished with bilateral patchy airspace opacities similar on the left and slightly increasing on the right when compared to prior imaging. No pneumothorax. Suspect left pleural effusion. No visible right effusion. No acute osseous or soft tissue abnormality. IMPRESSION: 1. Endotracheal tube terminates low in the trachea, approximately 1 cm from the carina. Consider retraction 2-3 cm to position in the mid trachea. 2. Bilateral patchy airspace opacities similar on the left and slightly increasing on the right when compared to prior imaging. Worrisome for infectious consolidation versus edema. 3. Left pleural effusion. These results will be called to the ordering clinician or representative by the Radiologist Assistant, and communication documented in the PACS or zVision Dashboard. Electronically Signed   By: Lovena Le M.D.   On: 10/28/2019 03:13     Medications:   .  prismasol BGK 4/2.5 400 mL/hr at 10/28/19 0420  .  prismasol BGK 4/2.5 400 mL/hr at 10/28/19 0420  . argatroban 0.5 mcg/kg/min (10/28/19 0824)  . calcium gluconate    . calcium gluconate    . ceFEPime (MAXIPIME) IV 2 g (10/28/19 1024)  . dexmedetomidine  (PRECEDEX) IV infusion 0.8 mcg/kg/hr (10/28/19 0943)  . doxycycline (VIBRAMYCIN) IV    . feeding supplement (VITAL AF 1.2 CAL)    . norepinephrine (LEVOPHED) Adult infusion Stopped (10/27/19 2251)  . phenylephrine (NEO-SYNEPHRINE) Adult infusion    . prismasol BGK 4/2.5 1,000 mL/hr at 10/28/19 0935  . vancomycin    . vasopressin (PITRESSIN) infusion - *FOR SHOCK* Stopped (10/27/19 2209)   . sodium chloride   Intravenous Once  . B-complex with vitamin C  1 tablet Per Tube QHS  . chlorhexidine gluconate (MEDLINE KIT)  15 mL Mouth Rinse BID  . Chlorhexidine Gluconate Cloth  6 each Topical Daily  . feeding supplement (PRO-STAT SUGAR FREE 64)  30 mL Per Tube Daily  . hydrocortisone sod succinate (SOLU-CORTEF) inj  50 mg Intravenous Q6H  . mouth rinse  15 mL Mouth Rinse 10 times per day  . pantoprazole sodium  40 mg Per Tube QHS  .  patiromer  8.4 g Oral Daily   acetaminophen **OR** acetaminophen, bisacodyl, fentaNYL (SUBLIMAZE) injection, heparin, LORazepam, magnesium citrate, ondansetron **OR** ondansetron (ZOFRAN) IV, polyethylene glycol, traMADol, vecuronium  Assessment/ Plan:  44 y.o. male with end-stage renal disease on hemodialysis, hypertension, peripheral vascular disease, depression, history of PEA event October 2020, tobacco abuse, cardiopulmonary arrest times 10/27/19  [facility] [address]/TTS/9 IJ catheter  1.  ESRD on HD TTS.  Patient unfortunately suffered cardiopulmonary arrest upon admission.  Blood pressure was low and patient had severe acidosis.  Therefore we elected for CRRT yesterday.  Maintain the patient on CRRT with current parameters.  Continue to monitor serum electrolytes and acid-base status.  2.  Acute respiratory failure with cardiopulmonary arrest.  Total downtime was 21 minutes between the 2 codes.  Patient on the ventilator.  Continue mechanical ventilation at this time.  3.  Anemia of chronic kidney disease.  Hemoglobin currently 7.3.  Defer decisions  regarding transfusion to primary team.  4.  Secondary hyperparathyroidism.  Monitor abdominal metabolism parameters over the course of the hospitalization.     LOS: 1 Shahab Polhamus 1/6/202112:15 PM  [place], [place]  Note: This note was prepared with Dragon dictation. Any transcription errors are unintentional

## 2019-10-28 NOTE — Progress Notes (Signed)
[facility] Cardiology    SUBJECTIVE: Altered mental status disoriented lethargic generalized diffuse weakness denies chest pain no palpitations or tachycardia not very communicative     Vitals:   10/28/19 1200 10/28/19 1300 10/28/19 1400 10/28/19 1500  BP:      Pulse: 69 67 67 66  Resp: 15 (!) 0 17 15  Temp:  (!) 97.5 F (36.4 C)    TempSrc:  Axillary    SpO2: 100% 100% 100% 100%  Weight:      Height:         Intake/Output Summary (Last 24 hours) at 10/28/2019 1554 Last data filed at 10/28/2019 1531 Gross per 24 hour  Intake 2028.86 ml  Output 239 ml  Net 1789.86 ml      PHYSICAL EXAM  General: Well developed, well nourished, in no acute distress HEENT:  Normocephalic and atramatic Neck:  No JVD.  Lungs: Clear bilaterally to auscultation and percussion. Heart: HRRR . Normal S1 and S2 without gallops or murmurs.  Abdomen: Bowel sounds are positive, abdomen soft and non-tender  Msk:  Back normal, normal gait. Normal strength and tone for age. Extremities: No clubbing, cyanosis or edema.   Neuro: Lethargic not responsive to verbal stimuli Psych:  Good affect, responds appropriately   LABS: Basic Metabolic Panel: Recent Labs    10/27/19 1823 10/28/19 0445  NA 131* 132*  K 4.8 4.7  CL 99 87*  CO2 11* 30  GLUCOSE 104* 125*  BUN 60* 72*  CREATININE 5.18* 5.80*  CALCIUM >15.0* 8.2*  MG  --  1.8  PHOS  --  8.3*   Liver Function Tests: Recent Labs    10/27/19 1823 10/28/19 0445  AST 29 37  ALT 14 19  ALKPHOS 63 58  BILITOT 1.9* 2.5*  PROT 5.0* 4.3*  ALBUMIN 2.1* 2.0*   No results for input(s): LIPASE, AMYLASE in the last 72 hours. CBC: Recent Labs    10/28/19 0445 10/28/19 0756  WBC 9.7 8.7  NEUTROABS  --  7.7  HGB 7.0* 7.3*  HCT 21.7* 22.4*  MCV 90.8 91.8  PLT 49* 50*   Cardiac Enzymes: No results for input(s): CKTOTAL, CKMB, CKMBINDEX, TROPONINI in the last 72 hours. BNP: Invalid input(s): POCBNP D-Dimer: No results for input(s): DDIMER in the  last 72 hours. Hemoglobin A1C: No results for input(s): HGBA1C in the last 72 hours. Fasting Lipid Panel: No results for input(s): CHOL, HDL, LDLCALC, TRIG, CHOLHDL, LDLDIRECT in the last 72 hours. Thyroid Function Tests: No results for input(s): TSH, T4TOTAL, T3FREE, THYROIDAB in the last 72 hours.  Invalid input(s): FREET3 Anemia Panel: No results for input(s): VITAMINB12, FOLATE, FERRITIN, TIBC, IRON, RETICCTPCT in the last 72 hours.  EEG  Result Date: 10/28/2019 Lora Havens, MD     10/28/2019  3:18 PM Patient Name: Brett Small MRN: [id number] Epilepsy Attending: Lora Havens Referring Physician/Provider: Darel Hong, NP Date: 10/27/2018 Duration: 27.57 minutes Patient history: 44 year old male with history of seizure disorder now status post cardiac arrest.  EEG to evaluate for seizures Level of alertness: Comatose/sedated AEDs during EEG study: None Technical aspects: This EEG study was done with scalp electrodes positioned according to the 10-20 International system of electrode placement. Electrical activity was acquired at a sampling rate of 500Hz  and reviewed with a high frequency filter of 70Hz  and a low frequency filter of 1Hz . EEG data were recorded continuously and digitally stored. Description: EEG showed continuous generalized, maximal bitemporal 3 to 6 Hz theta-delta slowing.  EEG was reactive  to noxious stimuli.  Hyperventilation photic summation were not performed. Abnormality -Continuous slow, generalized, maximal bitemporal IMPRESSION: This study is suggestive of nonspecific bitemporal cortical dysfunction as well as severe diffuse encephalopathy, nonspecific to etiology but could be secondary to sedation. No seizures or epileptiform discharges were seen throughout the recording. Lora Havens   DG Chest 1 View  Result Date: 10/27/2019 CLINICAL DATA:  Difficult intubation status post CPR EXAM: CHEST  1 VIEW COMPARISON:  October 27, 2019 at 12:19 p.m. FINDINGS: The  heart size remains enlarged. There is a small to moderate-sized left-sided pleural effusion. There is a small right-sided pleural effusion. There is diffuse increased attenuation within the left lung field. Prominent interstitial lung markings are noted. There is a stable tunneled dialysis catheter on the right. The endotracheal tube terminates approximately 3 cm above the carina. There is no pneumothorax. IMPRESSION: 1. Lines and tubes as above. 2. Persistent bilateral pleural effusions, left greater than right. 3. Worsening left-sided airspace disease concerning for pneumonia. Electronically Signed   By: Constance Holster M.D.   On: 10/27/2019 20:07   DG Chest 2 View  Result Date: 10/27/2019 CLINICAL DATA:  45 year old male with history of chest pain. Vomiting for the past 2 weeks. Diarrhea since yesterday. EXAM: CHEST - 2 VIEW COMPARISON:  Chest x-ray 10/03/2019. FINDINGS: Right internal jugular PermCath with tips terminating in the right atrium and superior cavoatrial junction. Left upper extremity vascular stent extending into the axillary region. Lung volumes are slightly low. Opacity at the left base may reflect atelectasis and/or consolidation. Small to moderate left pleural effusion, slightly decreased compared to the prior study. Right lung is clear. No right pleural effusion. No pneumothorax. No definite suspicious appearing pulmonary nodules or masses. No evidence of pulmonary edema. Mild cardiomegaly. Upper mediastinal contours are within normal limits. IMPRESSION: 1. Support apparatus and postoperative changes, as above. 2. Left lower lobe atelectasis and/or consolidation with superimposed small to moderate left pleural effusion, slightly decreased compared to the prior study. Electronically Signed   By: Vinnie Langton M.D.   On: 10/27/2019 12:36   DG Ankle Complete Right  Result Date: 10/27/2019 CLINICAL DATA:  Chronic wound with cellulitis EXAM: RIGHT ANKLE - COMPLETE 3+ VIEW COMPARISON:   None. FINDINGS: Frontal, oblique, and lateral views obtained. There is evidence of old trauma involving the calcaneus with remodeling. No acute fracture or joint effusion is evident. There is narrowing of the posterior aspect of the ankle joint. There is arthropathy throughout the subtalar joint regions. No frank erosion or bony destruction evident. IMPRESSION: Evidence of old trauma involving the calcaneus. Arthropathy throughout the subtalar joints. There is narrowing of the posterior ankle joint. No frank bony destruction appreciated by radiography. No acute fracture or joint effusion evident. If there remains concern for osteomyelitis, MR or nuclear medicine three-phase bone scan could be helpful for further assessment. Electronically Signed   By: Lowella Grip III M.D.   On: 10/27/2019 13:09   DG Abd 1 View  Result Date: 10/28/2019 CLINICAL DATA:  OG tube placement. EXAM: ABDOMEN - 1 VIEW COMPARISON:  Single-view of the abdomen 10/04/2019. FINDINGS: OG tube is in place with the tip in the distal stomach. IMPRESSION: As above. Electronically Signed   By: Inge Rise M.D.   On: 10/28/2019 11:56   CT HEAD WO CONTRAST  Result Date: 10/28/2019 CLINICAL DATA:  Cardiac arrest. EXAM: CT HEAD WITHOUT CONTRAST TECHNIQUE: Contiguous axial images were obtained from the base of the skull through the vertex without intravenous  contrast. COMPARISON:  None. FINDINGS: Brain: There is no mass, hemorrhage or extra-axial collection. The size and configuration of the ventricles and extra-axial CSF spaces are normal. The brain parenchyma is normal, without acute or chronic infarction. Vascular: No abnormal hyperdensity of the major intracranial arteries or dural venous sinuses. No intracranial atherosclerosis. Skull: The visualized skull base, calvarium and extracranial soft tissues are normal. Sinuses/Orbits: No fluid levels or advanced mucosal thickening of the visualized paranasal sinuses. No mastoid or middle ear  effusion. The orbits are normal. IMPRESSION: Normal head CT. Electronically Signed   By: Ulyses Jarred M.D.   On: 10/28/2019 01:50   US Venous Img Upper Uni Right(DVT)  Result Date: 10/28/2019 CLINICAL DATA:  Dialysis patient, right arm graft removed, skin changes and swelling. EXAM: RIGHT UPPER EXTREMITY VENOUS DOPPLER ULTRASOUND TECHNIQUE: Gray-scale sonography with graded compression, as well as color Doppler and duplex ultrasound were performed to evaluate the upper extremity deep venous system from the level of the subclavian vein and including the jugular, axillary, basilic, radial, ulnar and upper cephalic vein. Spectral Doppler was utilized to evaluate flow at rest and with distal augmentation maneuvers. COMPARISON:  None. FINDINGS: Contralateral Subclavian Vein: Respiratory phasicity is normal and symmetric with the symptomatic side. No evidence of thrombus. Normal compressibility. Internal Jugular Vein: No evidence of thrombus. Normal compressibility, respiratory phasicity and response to augmentation. Subclavian Vein: No evidence of thrombus. Normal compressibility, respiratory phasicity and response to augmentation. Axillary Vein: No evidence of thrombus. Normal compressibility, respiratory phasicity and response to augmentation. Cephalic Vein: No evidence of thrombus. Normal compressibility, respiratory phasicity and response to augmentation. Basilic Vein: No evidence of thrombus. Normal compressibility, respiratory phasicity and response to augmentation. Brachial Veins: No evidence of thrombus. Normal compressibility, respiratory phasicity and response to augmentation. Radial Veins: The forearm radial vein demonstrates hypoechoic thrombus appearing occlusive. Vein noncompressible. Minor thrombus burden without propagation into the brachial vein of the upper arm. Ulnar Veins: No evidence of thrombus. Normal compressibility, respiratory phasicity and response to augmentation. IMPRESSION: Right forearm  radial vein DVT. Very low thrombus burden. No propagation into the brachial vein. Electronically Signed   By: Jerilynn Mages.  Shick M.D.   On: 10/28/2019 10:31   DG Chest Port 1 View  Result Date: 10/28/2019 CLINICAL DATA:  Acute respiratory failure EXAM: PORTABLE CHEST 1 VIEW COMPARISON:  Radiograph 10/27/2019 FINDINGS: Dual lumen right IJ approach dialysis catheter tip terminates at the level of the right atrium and superior cavoatrial junction. An endotracheal tube terminates low in the trachea, approximately 1 cm from the carina. Pacer pad overlies the left chest wall. Additional support devices project over the chest. Left axillary vascular stent is again seen. Lung volumes remain diminished with bilateral patchy airspace opacities similar on the left and slightly increasing on the right when compared to prior imaging. No pneumothorax. Suspect left pleural effusion. No visible right effusion. No acute osseous or soft tissue abnormality. IMPRESSION: 1. Endotracheal tube terminates low in the trachea, approximately 1 cm from the carina. Consider retraction 2-3 cm to position in the mid trachea. 2. Bilateral patchy airspace opacities similar on the left and slightly increasing on the right when compared to prior imaging. Worrisome for infectious consolidation versus edema. 3. Left pleural effusion. These results will be called to the ordering clinician or representative by the Radiologist Assistant, and communication documented in the PACS or zVision Dashboard. Electronically Signed   By: Lovena Le M.D.   On: 10/28/2019 03:13     Echo echocardiogram for evaluation pending  TELEMETRY: Sinus rhythm nonspecific findings  ASSESSMENT AND PLAN:  Principal Problem:   Sepsis due to pneumonia [facility]) Active Problems:   End stage renal disease on dialysis [facility])   Hypertension   Depression   NSTEMI (non-ST elevated myocardial infarction) (HCC)   Pleural effusion   PVD (peripheral vascular disease) (HCC)   Chronic  systolic CHF (congestive heart failure) (HCC)   Nausea & vomiting   Generalized weakness   Cardiac arrest ([place])    Plan  Elevated troponins probably demand ischemia related to cardiac arrest recommend anticoagulation conservative therapy 2 broad-spectrum antibiotic therapy for sepsis 3 end-stage renal disease on dialysis we will continue dialysis treatment as necessary Hypertension reasonably controlled with episodes of hypotension Altered mental status recommend continued therapy to help with symptom management Do not recommend any direct cardiac intervention at this point Continue supportive management with critical care   Yolonda Kida, MD 10/28/2019 3:54 PM

## 2019-10-28 NOTE — Progress Notes (Signed)
Patient continues to be on CRRT- sedation weaned down then turned off earlier this afternoon- patient withdrawals with pain and bites down during mouth care. Patient's dialysis vascular cath was not sutured in when I tried to perform a dressing change- so Maudie Mercury- Vascular PA was notified and she assess and sutured the catheter in place.

## 2019-10-28 NOTE — Progress Notes (Signed)
Transported pt to CT and returned to ICU 10 on vent without incident. Pt tol well.

## 2019-10-28 NOTE — Progress Notes (Signed)
eeg completed ° °

## 2019-10-28 NOTE — Progress Notes (Signed)
Initial Nutrition Assessment  DOCUMENTATION CODES:   Non-severe (moderate) malnutrition in context of chronic illness  INTERVENTION:  Once enteral access placed and confirmed, initiate Vital AF 1.2 at 15 mL/hr and advance by 15 mL/hr every 8 hours to goal rate of 45 mL/hr (1080 mL goal daily volume). Also provide Pro-Stat 30 mL once daily per tube. Goal regimen provides 1396 kcal, 96 grams of protein, 875 mL H2O daily.  Provide minimum free water flush of 20-30 mL Q4hrs to maintain tube patency.  Provide B-complex with C QHS per tube.  Monitor magnesium, potassium, and phosphorus daily for at least 3 days, MD to replete as needed, as pt is at risk for refeeding syndrome given moderate malnutrition.  NUTRITION DIAGNOSIS:   Moderate Malnutrition related to chronic illness(ESRD on HD) as evidenced by moderate fat depletion, moderate muscle depletion, severe muscle depletion.  GOAL:   Provide needs based on ASPEN/SCCM guidelines  MONITOR:   Vent status, Labs, Weight trends, TF tolerance, I & O's  REASON FOR ASSESSMENT:   Ventilator    ASSESSMENT:   44 year old male with PMHx of ESRD on HD, HTN, secondary hyperparathyroidism, PVD, PAD who was admitted with sepsis and PNA, suffered two cardiac arrests, required intubation, also with septic and cardiogenic shock, hyperkalemia after missing HD.   Patient is currently intubated on ventilator support MV: 6.9 L/min Temp (24hrs), Avg:97.8 F (36.6 C), Min:96.6 F (35.9 C), Max:98.6 F (37 C)  Propofol: N/A  Medications reviewed and include: Solu-cortef 50 mg Q6hrs IV, Protonix, Precedex gtt.  Labs reviewed: Sodium 132, Chloride 87, BUN 72, Creatinine 5.8, Phosphorus 8.3.  Enteral Access: none at this time  Discussed with RN and on rounds. Patient was on 3 pressors but is now off all pressors. Plan is for placement of OGT today and initiation of tube feeds. Patient is on CRRT with UF 0 mL/hr.  NUTRITION - FOCUSED PHYSICAL  EXAM:    Most Recent Value  Orbital Region  Moderate depletion  Upper Arm Region  Moderate depletion  Thoracic and Lumbar Region  Mild depletion  Buccal Region  Unable to assess  Temple Region  Moderate depletion  Clavicle Bone Region  Moderate depletion  Clavicle and Acromion Bone Region  Moderate depletion  Scapular Bone Region  Unable to assess  Dorsal Hand  Mild depletion  Patellar Region  Severe depletion  Anterior Thigh Region  Severe depletion  Posterior Calf Region  Severe depletion  Edema (RD Assessment)  None  Hair  Reviewed  Eyes  Unable to assess  Mouth  Unable to assess  Skin  Reviewed [ecchymosis]  Nails  Reviewed     Diet Order:   Diet Order    None     EDUCATION NEEDS:   No education needs have been identified at this time  Skin:  Skin Assessment: Reviewed RN Assessment  Last BM:  Unknown/PTA  Height:   Ht Readings from Last 1 Encounters:  10/27/19 5\' 1"  (1.549 m)   Weight:   Wt Readings from Last 1 Encounters:  10/28/19 46.8 kg   Ideal Body Weight:  50.9 kg  BMI:  Body mass index is 19.49 kg/m.  Estimated Nutritional Needs:   Kcal:  1351 (PSU 2003b w/ MSJ 1219, Ve 6.9, Tmax 37)  Protein:  80-94 grams (1.7-2 grams/kg)  Fluid:  UOP + 1 L  Jacklynn Barnacle, MS, RD, LDN Office: [phone] Pager: [phone] After Hours/Weekend Pager: [phone]

## 2019-10-29 ENCOUNTER — Other Ambulatory Visit: Payer: Self-pay

## 2019-10-29 ENCOUNTER — Inpatient Hospital Stay: Payer: Medicare Other

## 2019-10-29 ENCOUNTER — Inpatient Hospital Stay
Admit: 2019-10-29 | Discharge: 2019-10-29 | Disposition: A | Discharge status: Home / Self Care | Payer: Medicare Other | Attending: Pulmonary Disease | Admitting: Pulmonary Disease

## 2019-10-29 DIAGNOSIS — J9601 Acute respiratory failure with hypoxia: Secondary | ICD-10-CM

## 2019-10-29 DIAGNOSIS — I998 Other disorder of circulatory system: Secondary | ICD-10-CM

## 2019-10-29 LAB — ECHOCARDIOGRAM COMPLETE
Height: 61 in
Weight: 1604.95 oz

## 2019-10-29 LAB — HEPARIN INDUCED PLATELET AB (HIT ANTIBODY): Heparin Induced Plt Ab: 0.103 OD (ref 0.000–0.400)

## 2019-10-29 LAB — RENAL FUNCTION PANEL
Albumin: 2.1 g/dL — ABNORMAL LOW (ref 3.5–5.0)
Albumin: 2 g/dL — ABNORMAL LOW (ref 3.5–5.0)
Anion gap: 12 (ref 5–15)
Anion gap: 9 (ref 5–15)
BUN: 32 mg/dL — ABNORMAL HIGH (ref 6–20)
BUN: 27 mg/dL — ABNORMAL HIGH (ref 6–20)
CO2: 25 mmol/L (ref 22–32)
CO2: 24 mmol/L (ref 22–32)
Calcium: 8.1 mg/dL — ABNORMAL LOW (ref 8.9–10.3)
Calcium: 7.9 mg/dL — ABNORMAL LOW (ref 8.9–10.3)
Chloride: 101 mmol/L (ref 98–111)
Chloride: 103 mmol/L (ref 98–111)
Creatinine, Ser: 2.02 mg/dL — ABNORMAL HIGH (ref 0.61–1.24)
Creatinine, Ser: 1.61 mg/dL — ABNORMAL HIGH (ref 0.61–1.24)
GFR calc Af Amer: 45 mL/min — ABNORMAL LOW (ref >60)
GFR calc Af Amer: 59 mL/min — ABNORMAL LOW (ref >60)
GFR calc non Af Amer: 39 mL/min — ABNORMAL LOW (ref >60)
GFR calc non Af Amer: 51 mL/min — ABNORMAL LOW (ref >60)
Glucose, Bld: 189 mg/dL — ABNORMAL HIGH (ref 70–99)
Glucose, Bld: 118 mg/dL — ABNORMAL HIGH (ref 70–99)
Phosphorus: 3.2 mg/dL (ref 2.5–4.6)
Phosphorus: 2.3 mg/dL — ABNORMAL LOW (ref 2.5–4.6)
Potassium: 4.6 mmol/L (ref 3.5–5.1)
Potassium: 4.4 mmol/L (ref 3.5–5.1)
Sodium: 138 mmol/L (ref 135–145)
Sodium: 136 mmol/L (ref 135–145)

## 2019-10-29 LAB — APTT
aPTT: 110 seconds — ABNORMAL HIGH (ref 24–36)
aPTT: 108 seconds — ABNORMAL HIGH (ref 24–36)
aPTT: 94 seconds — ABNORMAL HIGH (ref 24–36)
aPTT: 88 seconds — ABNORMAL HIGH (ref 24–36)
aPTT: 75 seconds — ABNORMAL HIGH (ref 24–36)

## 2019-10-29 LAB — CBC
HCT: 26.7 % — ABNORMAL LOW (ref 39.0–52.0)
Hemoglobin: 8.2 g/dL — ABNORMAL LOW (ref 13.0–17.0)
MCH: 29 pg (ref 26.0–34.0)
MCHC: 30.7 g/dL (ref 30.0–36.0)
MCV: 94.3 fL (ref 80.0–100.0)
Platelets: 79 10*3/uL — ABNORMAL LOW (ref 150–400)
RBC: 2.83 MIL/uL — ABNORMAL LOW (ref 4.22–5.81)
RDW: 21 % — ABNORMAL HIGH (ref 11.5–15.5)
WBC: 15.9 10*3/uL — ABNORMAL HIGH (ref 4.0–10.5)
nRBC: 0.2 % (ref 0.0–0.2)

## 2019-10-29 LAB — GLUCOSE, CAPILLARY
Glucose-Capillary: 106 mg/dL — ABNORMAL HIGH (ref 70–99)
Glucose-Capillary: 113 mg/dL — ABNORMAL HIGH (ref 70–99)
Glucose-Capillary: 167 mg/dL — ABNORMAL HIGH (ref 70–99)
Glucose-Capillary: 172 mg/dL — ABNORMAL HIGH (ref 70–99)
Glucose-Capillary: 193 mg/dL — ABNORMAL HIGH (ref 70–99)
Glucose-Capillary: 200 mg/dL — ABNORMAL HIGH (ref 70–99)
Glucose-Capillary: 225 mg/dL — ABNORMAL HIGH (ref 70–99)

## 2019-10-29 LAB — PROCALCITONIN: Procalcitonin: 2.14 ng/mL

## 2019-10-29 LAB — MAGNESIUM: Magnesium: 2.4 mg/dL (ref 1.7–2.4)

## 2019-10-29 MED ORDER — DOBUTAMINE IN D5W 4-5 MG/ML-% IV SOLN
2.5000 ug/kg/min | INTRAVENOUS | Status: DC
  Administered 2019-10-29: 2.5 ug/kg/min via INTRAVENOUS
  Filled 2019-10-29: qty 250

## 2019-10-29 MED ORDER — SODIUM CHLORIDE 0.9 % IV SOLN
INTRAVENOUS | Status: DC | PRN
  Administered 2019-10-29: 10:00:00 250 mL via INTRAVENOUS

## 2019-10-29 MED ORDER — INSULIN ASPART 100 UNIT/ML ~~LOC~~ SOLN
0.0000 [IU] | Freq: Four times a day (QID) | SUBCUTANEOUS | Status: DC
  Administered 2019-10-29: 3 [IU] via SUBCUTANEOUS
  Administered 2019-10-29 – 2019-10-30 (×2): 2 [IU] via SUBCUTANEOUS
  Administered 2019-10-30 (×2): 1 [IU] via SUBCUTANEOUS
  Administered 2019-10-30 (×2): 2 [IU] via SUBCUTANEOUS
  Administered 2019-10-31: 1 [IU] via SUBCUTANEOUS
  Administered 2019-10-31: 2 [IU] via SUBCUTANEOUS
  Administered 2019-10-31 – 2019-11-04 (×7): 1 [IU] via SUBCUTANEOUS
  Administered 2019-11-04: 13:00:00 2 [IU] via SUBCUTANEOUS
  Administered 2019-11-04: 1 [IU] via SUBCUTANEOUS
  Filled 2019-10-29 (×18): qty 1

## 2019-10-29 NOTE — Progress Notes (Addendum)
Shift summary:  - Upon AM assessment patient is intubated and sedated, on pressors, and on CRRT.   - Patient off of levophed this evening, now on dobutamine.   - Patient tolerating gentle fluid removal.  - OG removed by patient and replaced by this RN today.   - CVC dressing changed d/t oozing.

## 2019-10-29 NOTE — Progress Notes (Signed)
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  Patient Measurements: Height: 5\' 1"  (154.9 cm) Weight: 100 lb 5 oz (45.5 kg) IBW/kg (Calculated) : 52.3 HEPARIN DW (KG): 46.8  Vital Signs: Temp: 98.2 F (36.8 C) (01/07 1600) Temp Source: Oral (01/07 1600) Pulse Rate: 87 (01/07 1900)  Labs: Recent Labs    10/27/19 1449 10/27/19 1449 10/27/19 2152 10/28/19 0045 10/28/19 0301 10/28/19 0445 10/28/19 0756 10/28/19 0842 10/28/19 1443 10/29/19 0206 10/29/19 0409 10/29/19 0726 10/29/19 1241 10/29/19 1607 10/29/19 1815  HGB  --   --   --   --   --  7.0* 7.3*  --   --  8.2*  --   --   --   --   --   HCT  --   --   --   --   --  21.7* 22.4*  --   --  26.7*  --   --   --   --   --   PLT  --   --   --   --   --  49* 50*  --   --  79*  --   --   --   --   --   APTT 36  --   --   --   --   --   --  48* 107* 110*  --  108* 94*  --  88*  LABPROT 22.0*  --   --   --   --  26.3*  --  21.5*  --   --   --   --   --   --   --   INR 1.9*  --   --   --   --  2.4*  --  1.9*  --   --   --   --   --   --   --   CREATININE  --    < >  --   --   --  5.80*  --   --  3.49*  --  2.02*  --   --  1.61*  --   TROPONINIHS 65*  --  147* 227* 301*  --   --   --   --   --   --   --   --   --   --    < > = values in this interval not displayed.   Estimated Creatinine Clearance: 37.3 mL/min (A) (by C-G formula based on SCr of 1.61 mg/dL (H)).  Medical History: Past Medical History:  Diagnosis Date  . ESRD (end stage renal disease) ([place])   . Hypertension   . PAD (peripheral artery disease) (HCC)    Required aortofemoral stent-which had closed and had to redo the procedure and ischemia of limb.  . Peripheral vascular disease ([place])   . Renal disorder   . Secondary hyperparathyroidism of renal origin [facility])    Medications:  Medications Prior to Admission  Medication Sig Dispense Refill Last Dose  . amLODipine (NORVASC) 5 MG tablet Take 1 tablet (5 mg total) by  mouth daily. 30 tablet 3 unknown at unknown  . carvedilol (COREG) 25 MG tablet Take 1 tablet (25 mg total) by mouth 2 (two) times daily with a meal. 60 tablet 1 unknown at unknown  . apixaban (ELIQUIS) 2.5 MG TABS tablet Take 1 tablet (2.5 mg total) by mouth 2 (two) times daily. (Patient not taking: Reported on 10/28/2019) 60 tablet 1  Not Taking at Unknown time  . atorvastatin (LIPITOR) 80 MG tablet Take 1 tablet (80 mg total) by mouth at bedtime. (Patient not taking: Reported on 10/28/2019) 30 tablet 1 Not Taking at Unknown time  . b complex-C-folic acid 1 MG capsule Take 1 capsule by mouth daily after supper.    Not Taking at Unknown time  . carvedilol (COREG) 25 MG tablet Take 1 tablet (25 mg total) by mouth 2 (two) times daily with a meal. (Patient not taking: Reported on 10/28/2019) 60 tablet 0 Not Taking at Unknown time  . gabapentin (NEURONTIN) 100 MG capsule Take 1 capsule (100 mg total) by mouth 3 (three) times daily. (Patient not taking: Reported on 10/28/2019) 90 capsule 1 Not Taking at Unknown time  . hydrALAZINE (APRESOLINE) 25 MG tablet Take 1 tablet (25 mg total) by mouth every 8 (eight) hours. (Patient not taking: Reported on 10/28/2019) 90 tablet 1 Not Taking at Unknown time  . hydrOXYzine (ATARAX/VISTARIL) 25 MG tablet Take 25 mg by mouth 3 (three) times daily as needed for anxiety.   Not Taking at Unknown time  . levETIRAcetam (KEPPRA) 500 MG tablet Take 1 tablet (500 mg total) by mouth 2 (two) times daily. (Patient not taking: Reported on 10/28/2019) 60 tablet 0 Not Taking at Unknown time  . loratadine (CLARITIN) 10 MG tablet Take 1 tablet (10 mg total) by mouth daily. (Patient not taking: Reported on 10/28/2019) 30 tablet 0 Not Taking at Unknown time  . multivitamin (RENA-VIT) TABS tablet Take 1 tablet by mouth daily. (Patient not taking: Reported on 10/28/2019) 30 each 1 Not Taking at Unknown time  . neomycin-bacitracin-polymyxin (NEOSPORIN) OINT Apply 1 application topically 2 (two) times daily.  (Patient not taking: Reported on 10/28/2019) 56 g 1 Not Taking at Unknown time  . Nutritional Supplements (FEEDING SUPPLEMENT, NEPRO CARB STEADY,) LIQD Take 237 mLs by mouth 2 (two) times daily between meals. (Patient not taking: Reported on 10/28/2019) 237 mL 30 Not Taking at Unknown time  . sevelamer carbonate (RENVELA) 800 MG tablet Take 2 tablets (1,600 mg total) by mouth 3 (three) times daily with meals. 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  Patient's is in critical status with multiorgan failure, poor prognosis, and he has high risk for subsequent cardiac arrest. 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 SRA and heparin antibody pending.    Goal of Therapy:  Monitor platelets by anticoagulation protocol: Yes  Target aPTT 50-90 seconds  Course of Therapy Argatroban initiated at 0.5 mcg/kg/min for ICU patients per protocol. 0106 1137 aPTT 113s: dec infusion to 0.35 mcgkg/min 0106 1443 aPTT 107s: dec infusion to 0.30 mcg/kg/min 0106 2206 aPTT 92s: no change 0107 0206 aPTT 110s: dec infusion to 0.20 mcg/kg/min 0107 0726 aPTT 108s: dec infusion to 0.15 mcg/kg/min 0107 1241 aPTT 94s: dec infusion to 0.10 mcg/kg/min 0107 1815 aPTT 88s: continue infusion at 0.10 mcg/kg/min  Plan:  0107 1815 APTT 88 therapeutic. Will continue argatraban at 0.10 mcg/kg/min. APTT at 2200 to confirm. If remains therapeutic, consider following APTT every 12 hours thereafter as patient is critically ill.  Tawnya Crook, PharmD 10/29/2019 7:12 PM

## 2019-10-29 NOTE — Plan of Care (Signed)

## 2019-10-29 NOTE — Progress Notes (Signed)
Hemodialysis patient known at [facility] TTS 11:00. Clinic aware of patient admission. Please contact me with any dialysis placement concerns.  [name] Dialysis  Coordinator [phone]

## 2019-10-29 NOTE — Consult Note (Signed)
[facility] for Electrolyte Monitoring and Replacement   Recent Labs: Potassium (mmol/L)  Date Value  10/29/2019 4.6   Magnesium (mg/dL)  Date Value  10/29/2019 2.4   Calcium (mg/dL)  Date Value  10/29/2019 8.1 (L)   Albumin (g/dL)  Date Value  10/29/2019 2.1 (L)   Phosphorus (mg/dL)  Date Value  10/29/2019 3.2   Sodium (mmol/L)  Date Value  10/29/2019 138   Corrected Ca: 9.6 mg/dL  Assessment: Brett Small is a 44 y.o. male admitted on 10/27/2019 with sepsis and acute respiratory failure, as well as cardiac arrest x2 (but did not qualify for hypothermia protocol).  Patient has past medical history of ESRD, hypertension, and peripheral artery disease.   Patient normally receives HD TTS, but has been changed to CRRT inpatient due to hemodynamic instability.  He is currently ventilated.  Patient is on Veltassa.  Corrected Ca:  9.4  Goal of Therapy:  K 4.0-5.1 mmol/L Magnesium 2.0-2.4 mg/dL All other electrolytes WNL  Plan:   Continue Veltassa  No other electrolyte replacement warranted  magnesium and BMP in the AM.  Dallie Piles, PharmD Clinical Pharmacist 10/29/2019 1:45 PM

## 2019-10-29 NOTE — Progress Notes (Signed)
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  Patient Measurements: Height: 5\' 1"  (154.9 cm) Weight: 100 lb 5 oz (45.5 kg) IBW/kg (Calculated) : 52.3 HEPARIN DW (KG): 46.8  Vital Signs: Temp: 98.2 F (36.8 C) (01/07 1600) Temp Source: Oral (01/07 1600) Pulse Rate: 79 (01/07 2200)  Labs: Recent Labs    10/27/19 1449 10/27/19 1449 10/27/19 2152 10/28/19 0045 10/28/19 0301 10/28/19 0445 10/28/19 0756 10/28/19 0842 10/28/19 1443 10/29/19 0206 10/29/19 0409 10/29/19 1241 10/29/19 1607 10/29/19 1815 10/29/19 2200  HGB  --   --   --   --   --  7.0* 7.3*  --   --  8.2*  --   --   --   --   --   HCT  --   --   --   --   --  21.7* 22.4*  --   --  26.7*  --   --   --   --   --   PLT  --   --   --   --   --  49* 50*  --   --  79*  --   --   --   --   --   APTT 36  --   --   --   --   --   --  48* 107* 110*  --  94*  --  88* 75*  LABPROT 22.0*  --   --   --   --  26.3*  --  21.5*  --   --   --   --   --   --   --   INR 1.9*  --   --   --   --  2.4*  --  1.9*  --   --   --   --   --   --   --   CREATININE  --    < >  --   --   --  5.80*  --   --  3.49*  --  2.02*  --  1.61*  --   --   TROPONINIHS 65*  --  147* 227* 301*  --   --   --   --   --   --   --   --   --   --    < > = values in this interval not displayed.   Estimated Creatinine Clearance: 37.3 mL/min (A) (by C-G formula based on SCr of 1.61 mg/dL (H)).  Medical History: Past Medical History:  Diagnosis Date  . ESRD (end stage renal disease) ([place])   . Hypertension   . PAD (peripheral artery disease) (HCC)    Required aortofemoral stent-which had closed and had to redo the procedure and ischemia of limb.  . Peripheral vascular disease ([place])   . Renal disorder   . Secondary hyperparathyroidism of renal origin [facility])    Medications:  Medications Prior to Admission  Medication Sig Dispense Refill Last Dose  . amLODipine (NORVASC) 5 MG tablet Take 1 tablet (5 mg total) by  mouth daily. 30 tablet 3 unknown at unknown  . carvedilol (COREG) 25 MG tablet Take 1 tablet (25 mg total) by mouth 2 (two) times daily with a meal. 60 tablet 1 unknown at unknown  . apixaban (ELIQUIS) 2.5 MG TABS tablet Take 1 tablet (2.5 mg total) by mouth 2 (two) times daily. (Patient not taking: Reported on 10/28/2019) 60 tablet 1  Not Taking at Unknown time  . atorvastatin (LIPITOR) 80 MG tablet Take 1 tablet (80 mg total) by mouth at bedtime. (Patient not taking: Reported on 10/28/2019) 30 tablet 1 Not Taking at Unknown time  . b complex-C-folic acid 1 MG capsule Take 1 capsule by mouth daily after supper.    Not Taking at Unknown time  . carvedilol (COREG) 25 MG tablet Take 1 tablet (25 mg total) by mouth 2 (two) times daily with a meal. (Patient not taking: Reported on 10/28/2019) 60 tablet 0 Not Taking at Unknown time  . gabapentin (NEURONTIN) 100 MG capsule Take 1 capsule (100 mg total) by mouth 3 (three) times daily. (Patient not taking: Reported on 10/28/2019) 90 capsule 1 Not Taking at Unknown time  . hydrALAZINE (APRESOLINE) 25 MG tablet Take 1 tablet (25 mg total) by mouth every 8 (eight) hours. (Patient not taking: Reported on 10/28/2019) 90 tablet 1 Not Taking at Unknown time  . hydrOXYzine (ATARAX/VISTARIL) 25 MG tablet Take 25 mg by mouth 3 (three) times daily as needed for anxiety.   Not Taking at Unknown time  . levETIRAcetam (KEPPRA) 500 MG tablet Take 1 tablet (500 mg total) by mouth 2 (two) times daily. (Patient not taking: Reported on 10/28/2019) 60 tablet 0 Not Taking at Unknown time  . loratadine (CLARITIN) 10 MG tablet Take 1 tablet (10 mg total) by mouth daily. (Patient not taking: Reported on 10/28/2019) 30 tablet 0 Not Taking at Unknown time  . multivitamin (RENA-VIT) TABS tablet Take 1 tablet by mouth daily. (Patient not taking: Reported on 10/28/2019) 30 each 1 Not Taking at Unknown time  . neomycin-bacitracin-polymyxin (NEOSPORIN) OINT Apply 1 application topically 2 (two) times daily.  (Patient not taking: Reported on 10/28/2019) 56 g 1 Not Taking at Unknown time  . Nutritional Supplements (FEEDING SUPPLEMENT, NEPRO CARB STEADY,) LIQD Take 237 mLs by mouth 2 (two) times daily between meals. (Patient not taking: Reported on 10/28/2019) 237 mL 30 Not Taking at Unknown time  . sevelamer carbonate (RENVELA) 800 MG tablet Take 2 tablets (1,600 mg total) by mouth 3 (three) times daily with meals. 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  Patient's is in critical status with multiorgan failure, poor prognosis, and he has high risk for subsequent cardiac arrest. H&H is stable, platelets are trending up  Per [place] Anticoagulation Protocol, 4T score = 4 ("Possible HIT"). SRA and heparin antibody pending.    Goal of Therapy:  Monitor platelets by anticoagulation protocol: Yes  Target aPTT 50-90 seconds  Course of Therapy Argatroban initiated at 0.5 mcg/kg/min for ICU patients per protocol. 0106 1137 aPTT 113s: dec infusion to 0.35 mcgkg/min 0106 1443 aPTT 107s: dec infusion to 0.30 mcg/kg/min 0106 2206 aPTT 92s: no change 0107 0206 aPTT 110s: dec infusion to 0.20 mcg/kg/min 0107 0726 aPTT 108s: dec infusion to 0.15 mcg/kg/min 0107 1241 aPTT 94s: dec infusion to 0.10 mcg/kg/min 0107 1815 aPTT 88s: continue infusion at 0.10 mcg/kg/min 0107 2200 aPTT 75s: continue infusion at 0.32mcg/kg/min  Plan:  0107 2200 APTT 75 therapeutic. Will continue argatraban at 0.10 mcg/kg/min. APTT at 10am tomorrow. Since therapeutic wii follow APTT every 12 hours thereafter as patient is critically ill.  Ena Dawley, PharmD 10/29/2019 10:43 PM

## 2019-10-29 NOTE — Progress Notes (Signed)
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  Patient Measurements: Height: 5\' 1"  (154.9 cm) Weight: 103 lb 2.8 oz (46.8 kg) IBW/kg (Calculated) : 52.3 HEPARIN DW (KG): 46.8  Vital Signs: Temp: 98 F (36.7 C) (01/07 0000) Temp Source: Oral (01/07 0000) BP: 119/78 (01/07 0200) Pulse Rate: 71 (01/07 0200)  Labs: Recent Labs    10/27/19 1307 10/27/19 1449 10/27/19 1823 10/27/19 2152 10/28/19 0045 10/28/19 0301 10/28/19 0445 10/28/19 0756 10/28/19 0842 10/28/19 1443 10/28/19 2206 10/29/19 0206  HGB  --   --  9.0*  --   --   --  7.0* 7.3*  --   --   --  8.2*  HCT  --   --  31.5*  --   --   --  21.7* 22.4*  --   --   --  26.7*  PLT  --   --  71*  --   --   --  49* 50*  --   --   --  79*  APTT   < > 36  --   --   --   --   --   --  48* 107* 92* 110*  LABPROT  --  22.0*  --   --   --   --  26.3*  --  21.5*  --   --   --   INR  --  1.9*  --   --   --   --  2.4*  --  1.9*  --   --   --   CREATININE  --   --  5.18*  --   --   --  5.80*  --   --  3.49*  --   --   TROPONINIHS   < > 65* 63* 147* 227* 301*  --   --   --   --   --   --    < > = values in this interval not displayed.   Estimated Creatinine Clearance: 17.7 mL/min (A) (by C-G formula based on SCr of 3.49 mg/dL (H)).  Medical History: Past Medical History:  Diagnosis Date  . ESRD (end stage renal disease) ([place])   . Hypertension   . PAD (peripheral artery disease) (HCC)    Required aortofemoral stent-which had closed and had to redo the procedure and ischemia of limb.  . Peripheral vascular disease ([place])   . Renal disorder   . Secondary hyperparathyroidism of renal origin [facility])    Medications:  Medications Prior to Admission  Medication Sig Dispense Refill Last Dose  . amLODipine (NORVASC) 5 MG tablet Take 1 tablet (5 mg total) by mouth daily. 30 tablet 3 unknown at unknown  . carvedilol (COREG) 25 MG tablet Take 1 tablet (25 mg total) by mouth 2 (two) times daily with  a meal. 60 tablet 1 unknown at unknown  . apixaban (ELIQUIS) 2.5 MG TABS tablet Take 1 tablet (2.5 mg total) by mouth 2 (two) times daily. (Patient not taking: Reported on 10/28/2019) 60 tablet 1 Not Taking at Unknown time  . atorvastatin (LIPITOR) 80 MG tablet Take 1 tablet (80 mg total) by mouth at bedtime. (Patient not taking: Reported on 10/28/2019) 30 tablet 1 Not Taking at Unknown time  . b complex-C-folic acid 1 MG capsule Take 1 capsule by mouth daily after supper.    Not Taking at Unknown time  . carvedilol (COREG) 25 MG tablet Take 1 tablet (25 mg total) by mouth  2 (two) times daily with a meal. (Patient not taking: Reported on 10/28/2019) 60 tablet 0 Not Taking at Unknown time  . gabapentin (NEURONTIN) 100 MG capsule Take 1 capsule (100 mg total) by mouth 3 (three) times daily. (Patient not taking: Reported on 10/28/2019) 90 capsule 1 Not Taking at Unknown time  . hydrALAZINE (APRESOLINE) 25 MG tablet Take 1 tablet (25 mg total) by mouth every 8 (eight) hours. (Patient not taking: Reported on 10/28/2019) 90 tablet 1 Not Taking at Unknown time  . hydrOXYzine (ATARAX/VISTARIL) 25 MG tablet Take 25 mg by mouth 3 (three) times daily as needed for anxiety.   Not Taking at Unknown time  . levETIRAcetam (KEPPRA) 500 MG tablet Take 1 tablet (500 mg total) by mouth 2 (two) times daily. (Patient not taking: Reported on 10/28/2019) 60 tablet 0 Not Taking at Unknown time  . loratadine (CLARITIN) 10 MG tablet Take 1 tablet (10 mg total) by mouth daily. (Patient not taking: Reported on 10/28/2019) 30 tablet 0 Not Taking at Unknown time  . multivitamin (RENA-VIT) TABS tablet Take 1 tablet by mouth daily. (Patient not taking: Reported on 10/28/2019) 30 each 1 Not Taking at Unknown time  . neomycin-bacitracin-polymyxin (NEOSPORIN) OINT Apply 1 application topically 2 (two) times daily. (Patient not taking: Reported on 10/28/2019) 56 g 1 Not Taking at Unknown time  . Nutritional Supplements (FEEDING SUPPLEMENT, NEPRO CARB  STEADY,) LIQD Take 237 mLs by mouth 2 (two) times daily between meals. (Patient not taking: Reported on 10/28/2019) 237 mL 30 Not Taking at Unknown time  . sevelamer carbonate (RENVELA) 800 MG tablet Take 2 tablets (1,600 mg total) by mouth 3 (three) times daily with meals. 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  Patient's is in critical status with multiorgan failure, poor prognosis, and he has high risk for subsequent cardiac arrest.  Family in agreement for continuing aggressive interventions for the next 24 to 48 hours with reassessment.    Per [place] Anticoagulation Protocol, 4T score = 4 ("Possible HIT"). SRA and heparin antibody pending.    Goal of Therapy:  Monitor platelets by anticoagulation protocol: Yes  Target aPTT 50-90 seconds  Argatroban initiated at 0.5 mcg/kg/min for ICU patients per protocol.   0106 @ 0842 aPTT 48 at baseline (but drawn during 1.4 mL/hr infusion)  0106 @ 1137 aPTT 113.  Per protocol, decreased infusion by 30%.  Adjusted to 0.98 mL/hr (0.35 mcgkg/min).  Per nurse, pump actually set to 1 mL/hr, as pump cannot be programmed at 0.98 mL/hr).  Plan:   0106 @ 1443 aPTT 107: due to CRRT therapy aPTT times are being moved to every 4 hours   Decrease infusion by approximately 20% (this is more conservative than the protocol suggests due to the previous testing interval of 2 hours)    Will lower to 0.3 mcg/kg/min  0106 @ 2206 aPTT 92 which is just barely above goal therefore will continue current rate and recheck aPTT in 4 hrs to reevaluate.  0107 @ 0206 aPTT 110, supratherapeutic, trending up.  CBC improved.  Decrease infusion by approximately 30%, will lower infusion to 0.43mcg/kg/min. Recheck aPTT 4 hours after rate change  Will order CBC daily per protocol.  Ena Dawley, PharmD 10/29/2019 2:53 AM

## 2019-10-29 NOTE — Progress Notes (Signed)
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  Patient Measurements: Height: 5\' 1"  (154.9 cm) Weight: 100 lb 5 oz (45.5 kg) IBW/kg (Calculated) : 52.3 HEPARIN DW (KG): 46.8  Vital Signs: Temp: 98.1 F (36.7 C) (01/07 1200) Temp Source: Oral (01/07 1200) BP: 118/77 (01/07 0300) Pulse Rate: 95 (01/07 1300)  Labs: Recent Labs    10/27/19 1449 10/27/19 2152 10/28/19 0045 10/28/19 0301 10/28/19 0445 10/28/19 0756 10/28/19 0842 10/28/19 1443 10/29/19 0206 10/29/19 0409 10/29/19 0726 10/29/19 1241  HGB  --   --   --   --  7.0* 7.3*  --   --  8.2*  --   --   --   HCT  --   --   --   --  21.7* 22.4*  --   --  26.7*  --   --   --   PLT  --   --   --   --  49* 50*  --   --  79*  --   --   --   APTT 36  --   --   --   --   --  48* 107* 110*  --  108* 94*  LABPROT 22.0*  --   --   --  26.3*  --  21.5*  --   --   --   --   --   INR 1.9*  --   --   --  2.4*  --  1.9*  --   --   --   --   --   CREATININE  --   --   --   --  5.80*  --   --  3.49*  --  2.02*  --   --   TROPONINIHS 65* 147* 227* 301*  --   --   --   --   --   --   --   --    Estimated Creatinine Clearance: 29.7 mL/min (A) (by C-G formula based on SCr of 2.02 mg/dL (H)).  Medical History: Past Medical History:  Diagnosis Date  . ESRD (end stage renal disease) ([place])   . Hypertension   . PAD (peripheral artery disease) (HCC)    Required aortofemoral stent-which had closed and had to redo the procedure and ischemia of limb.  . Peripheral vascular disease ([place])   . Renal disorder   . Secondary hyperparathyroidism of renal origin [facility])    Medications:  Medications Prior to Admission  Medication Sig Dispense Refill Last Dose  . amLODipine (NORVASC) 5 MG tablet Take 1 tablet (5 mg total) by mouth daily. 30 tablet 3 unknown at unknown  . carvedilol (COREG) 25 MG tablet Take 1 tablet (25 mg total) by mouth 2 (two) times daily with a meal. 60 tablet 1 unknown at unknown  .  apixaban (ELIQUIS) 2.5 MG TABS tablet Take 1 tablet (2.5 mg total) by mouth 2 (two) times daily. (Patient not taking: Reported on 10/28/2019) 60 tablet 1 Not Taking at Unknown time  . atorvastatin (LIPITOR) 80 MG tablet Take 1 tablet (80 mg total) by mouth at bedtime. (Patient not taking: Reported on 10/28/2019) 30 tablet 1 Not Taking at Unknown time  . b complex-C-folic acid 1 MG capsule Take 1 capsule by mouth daily after supper.    Not Taking at Unknown time  . carvedilol (COREG) 25 MG tablet Take 1 tablet (25 mg total) by mouth 2 (two) times daily with a meal. (  Patient not taking: Reported on 10/28/2019) 60 tablet 0 Not Taking at Unknown time  . gabapentin (NEURONTIN) 100 MG capsule Take 1 capsule (100 mg total) by mouth 3 (three) times daily. (Patient not taking: Reported on 10/28/2019) 90 capsule 1 Not Taking at Unknown time  . hydrALAZINE (APRESOLINE) 25 MG tablet Take 1 tablet (25 mg total) by mouth every 8 (eight) hours. (Patient not taking: Reported on 10/28/2019) 90 tablet 1 Not Taking at Unknown time  . hydrOXYzine (ATARAX/VISTARIL) 25 MG tablet Take 25 mg by mouth 3 (three) times daily as needed for anxiety.   Not Taking at Unknown time  . levETIRAcetam (KEPPRA) 500 MG tablet Take 1 tablet (500 mg total) by mouth 2 (two) times daily. (Patient not taking: Reported on 10/28/2019) 60 tablet 0 Not Taking at Unknown time  . loratadine (CLARITIN) 10 MG tablet Take 1 tablet (10 mg total) by mouth daily. (Patient not taking: Reported on 10/28/2019) 30 tablet 0 Not Taking at Unknown time  . multivitamin (RENA-VIT) TABS tablet Take 1 tablet by mouth daily. (Patient not taking: Reported on 10/28/2019) 30 each 1 Not Taking at Unknown time  . neomycin-bacitracin-polymyxin (NEOSPORIN) OINT Apply 1 application topically 2 (two) times daily. (Patient not taking: Reported on 10/28/2019) 56 g 1 Not Taking at Unknown time  . Nutritional Supplements (FEEDING SUPPLEMENT, NEPRO CARB STEADY,) LIQD Take 237 mLs by mouth 2 (two) times  daily between meals. (Patient not taking: Reported on 10/28/2019) 237 mL 30 Not Taking at Unknown time  . sevelamer carbonate (RENVELA) 800 MG tablet Take 2 tablets (1,600 mg total) by mouth 3 (three) times daily with meals. 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  Patient's is in critical status with multiorgan failure, poor prognosis, and he has high risk for subsequent cardiac arrest. H&H is stable, platelets are trending up  Per [facility] Anticoagulation Protocol, 4T score = 4 ("Possible HIT"). SRA and heparin antibody pending.    Goal of Therapy:  Monitor platelets by anticoagulation protocol: Yes  Target aPTT 50-90 seconds  Course of Therapy Argatroban initiated at 0.5 mcg/kg/min for ICU patients per protocol. 0106 1137 aPTT 113s: dec infusion to 0.35 mcgkg/min 0106 1443 aPTT 107s: dec infusion to 0.30 mcg/kg/min 0106 2206 aPTT 92s: no change 0107 0206 aPTT 110s: dec infusion to 0.20 mcg/kg/min 0107 0726 aPTT 108s: dec infusion to 0.15 mcg/kg/min 0107 1241 aPTT 94s: dec infusion to 0.10 mcg/kg/min  Plan:   Decrease infusion to 0.10 mcg/kg/min  Recheck aPTT 4 hours after rate change  CBC daily per protocol.  Dallie Piles, PharmD 10/29/2019 1:29 PM

## 2019-10-29 NOTE — Progress Notes (Signed)
Earlier today, MRI Head was ordered due to focal neuro deficit concerning for possible CVA.  Pt is now awake and alert, awake to follow commands, no focal deficits,  Pupils PERRLA, and writing with pen and pencil to communicate.  Given that he has no focal deficits at this time, will d/c MRI Head.    Darel Hong, AGACNP-BC [facility] Pager: [phone]

## 2019-10-29 NOTE — Progress Notes (Signed)
[facility], [place] 10/29/19  Subjective:  Patient with ischemic right hand with right radial clot. Still on CRRT and appears to be tolerating well. Case discussed with nursing and we have decided to add 25 mL an hour of patient fluid removal.    Objective:  Vital signs in last 24 hours:  Temp:  [97.4 F (36.3 C)-98.2 F (36.8 C)] 97.4 F (36.3 C) (01/07 0736) Pulse Rate:  [65-75] 71 (01/07 1100) Resp:  [0-21] 20 (01/07 1100) BP: (86-121)/(56-81) 118/77 (01/07 0300) SpO2:  [98 %-100 %] 99 % (01/07 1132) Arterial Line BP: (84-134)/(53-85) 104/65 (01/07 1100) FiO2 (%):  [28 %-50 %] 28 % (01/07 1132) Weight:  [45.5 kg] 45.5 kg (01/07 0500)  Weight change: -1.3 kg Filed Weights   10/27/19 1209 10/28/19 0500 10/29/19 0500  Weight: 46.8 kg 46.8 kg 45.5 kg    Intake/Output:    Intake/Output Summary (Last 24 hours) at 10/29/2019 1210 Last data filed at 10/29/2019 1100 Gross per 24 hour  Intake 2187.54 ml  Output 1809 ml  Net 378.54 ml     Physical Exam: General:  Critically ill-appearing  HEENT  endotracheal tube in place  Pulm/lungs  bilateral rales and rhonchi, vent assisted  CVS/Heart  S1S2 no rubs  Abdomen:   Soft, nontender  Extremities:  No lower extremity edema.  Swollen and mottled right hand.  Neurologic:  Intubated, sedated  Skin:  No acute rashes  Access:  Right IJ PermCath       Basic Metabolic Panel:  Recent Labs  Lab 10/27/19 1307 10/27/19 1823 10/28/19 0445 10/28/19 1443 10/29/19 0206 10/29/19 0409  NA 131* 131* 132* 136  --  138  K 6.6* 4.8 4.7 4.9  --  4.6  CL 89* 99 87* 98  --  101  CO2 18* 11* 30 28  --  25  GLUCOSE 107* 104* 125* 114*  --  189*  BUN 70* 60* 72* 44*  --  32*  CREATININE 6.34* 5.18* 5.80* 3.49*  --  2.02*  CALCIUM 9.2 >15.0* 8.2* 7.9*  --  8.1*  MG  --   --  1.8  --  2.4  --   PHOS  --   --  8.3* 5.2*  --  3.2     CBC: Recent Labs  Lab 10/27/19 1307 10/27/19 1823 10/28/19 0445  10/28/19 0756 10/29/19 0206  WBC 13.3* 14.3* 9.7 8.7 15.9*  NEUTROABS  --   --   --  7.7  --   HGB 9.7* 9.0* 7.0* 7.3* 8.2*  HCT 31.6* 31.5* 21.7* 22.4* 26.7*  MCV 94.9 100.0 90.8 91.8 94.3  PLT 133* 71* 49* 50* 79*      Lab Results  Component Value Date   HEPBSAG NON REACTIVE 10/03/2019   HEPBSAB Non Reactive 12/25/2018   HEPBIGM NON REACTIVE 10/03/2019      Microbiology:  Recent Results (from the past 240 hour(s))  Blood Culture (routine x 2)     Status: None (Preliminary result)   Collection Time: 10/27/19  1:07 PM   Specimen: BLOOD  Result Value Ref Range Status   Specimen Description BLOOD BLOOD RIGHT HAND  Final   Special Requests   Final    BOTTLES DRAWN AEROBIC AND ANAEROBIC Blood Culture results may not be optimal due to an inadequate volume of blood received in culture bottles   Culture  Setup Time   Final    GRAM POSITIVE RODS AEROBIC BOTTLE ONLY CRITICAL RESULT CALLED TO, READ  BACK BY AND VERIFIED WITH: SCOTT HALL 10/28/19 @ 8119  [place] Performed at [facility], [address] [place], [place] [address]    Culture GRAM POSITIVE RODS  Final   Report Status PENDING  Incomplete  Blood Culture (routine x 2)     Status: None (Preliminary result)   Collection Time: 10/27/19  1:07 PM   Specimen: BLOOD  Result Value Ref Range Status   Specimen Description BLOOD BLOOD LEFT HAND  Final   Special Requests   Final    BOTTLES DRAWN AEROBIC ONLY Blood Culture results may not be optimal due to an inadequate volume of blood received in culture bottles   Culture   Final    NO GROWTH 2 DAYS Performed at [facility], [address], [place], [place] [address]    Report Status PENDING  Incomplete  SARS CORONAVIRUS 2 (TAT 6-24 HRS) Nasopharyngeal Nasopharyngeal Swab     Status: None   Collection Time: 10/27/19  3:54 PM   Specimen: Nasopharyngeal Swab  Result Value Ref Range Status   SARS Coronavirus 2 NEGATIVE NEGATIVE Final    Comment:  (NOTE) SARS-CoV-2 target nucleic acids are NOT DETECTED. The SARS-CoV-2 RNA is generally detectable in upper and lower respiratory specimens during the acute phase of infection. Negative results do not preclude SARS-CoV-2 infection, do not rule out co-infections with other pathogens, and should not be used as the sole basis for treatment or other patient management decisions. Negative results must be combined with clinical observations, patient history, and epidemiological information. The expected result is Negative. Fact Sheet for Patients: SugarRoll.be Fact Sheet for Healthcare Providers: https://www.woods-mathews.com/ This test is not yet approved or cleared by the [place] FDA and  has been authorized for detection and/or diagnosis of SARS-CoV-2 by FDA under an Emergency Use Authorization (EUA). This EUA will remain  in effect (meaning this test can be used) for the duration of the COVID-19 declaration under Section 56 4(b)(1) of the Act, 21 U.S.C. section 360bbb-3(b)(1), unless the authorization is terminated or revoked sooner. Performed at [facility], [place] [address], [place], [place] [address]   MRSA PCR Screening     Status: Abnormal   Collection Time: 10/27/19  9:52 PM   Specimen: Nasopharyngeal  Result Value Ref Range Status   MRSA by PCR POSITIVE (A) NEGATIVE Final    Comment:        The GeneXpert MRSA Assay (FDA approved for NASAL specimens only), is one component of a comprehensive MRSA colonization surveillance program. It is not intended to diagnose MRSA infection nor to guide or monitor treatment for MRSA infections. RESULT CALLED TO, READ BACK BY AND VERIFIED WITH: Shary Key RN [phone] 10/27/19 [facility] Performed at [facility], [address] [place], [place] [address]     Coagulation Studies: Recent Labs    10/27/19 1449 10/28/19 0445 10/28/19 0842  LABPROT 22.0* 26.3* 21.5*  INR 1.9*  2.4* 1.9*    Urinalysis: No results for input(s): COLORURINE, LABSPEC, PHURINE, GLUCOSEU, HGBUR, BILIRUBINUR, KETONESUR, PROTEINUR, UROBILINOGEN, NITRITE, LEUKOCYTESUR in the last 72 hours.  Invalid input(s): APPERANCEUR    Imaging: EEG  Result Date: 10/28/2019 Lora Havens, MD     10/28/2019  3:18 PM Patient Name: Brett Small MRN: [id number] Epilepsy Attending: Lora Havens Referring Physician/Provider: Darel Hong, NP Date: 10/27/2018 Duration: 27.57 minutes Patient history: 44 year old male with history of seizure disorder now status post cardiac arrest.  EEG to evaluate for seizures Level of alertness: Comatose/sedated  AEDs during EEG study: None Technical aspects: This EEG study was done with scalp electrodes positioned according to the 10-20 International system of electrode placement. Electrical activity was acquired at a sampling rate of '500Hz'  and reviewed with a high frequency filter of '70Hz'  and a low frequency filter of '1Hz' . EEG data were recorded continuously and digitally stored. Description: EEG showed continuous generalized, maximal bitemporal 3 to 6 Hz theta-delta slowing.  EEG was reactive to noxious stimuli.  Hyperventilation photic summation were not performed. Abnormality -Continuous slow, generalized, maximal bitemporal IMPRESSION: This study is suggestive of nonspecific bitemporal cortical dysfunction as well as severe diffuse encephalopathy, nonspecific to etiology but could be secondary to sedation. No seizures or epileptiform discharges were seen throughout the recording. Lora Havens   DG Chest 1 View  Result Date: 10/27/2019 CLINICAL DATA:  Difficult intubation status post CPR EXAM: CHEST  1 VIEW COMPARISON:  October 27, 2019 at 12:19 p.m. FINDINGS: The heart size remains enlarged. There is a small to moderate-sized left-sided pleural effusion. There is a small right-sided pleural effusion. There is diffuse increased attenuation within the left lung field.  Prominent interstitial lung markings are noted. There is a stable tunneled dialysis catheter on the right. The endotracheal tube terminates approximately 3 cm above the carina. There is no pneumothorax. IMPRESSION: 1. Lines and tubes as above. 2. Persistent bilateral pleural effusions, left greater than right. 3. Worsening left-sided airspace disease concerning for pneumonia. Electronically Signed   By: Constance Holster M.D.   On: 10/27/2019 20:07   DG Chest 2 View  Result Date: 10/27/2019 CLINICAL DATA:  44 year old male with history of chest pain. Vomiting for the past 2 weeks. Diarrhea since yesterday. EXAM: CHEST - 2 VIEW COMPARISON:  Chest x-ray 10/03/2019. FINDINGS: Right internal jugular PermCath with tips terminating in the right atrium and superior cavoatrial junction. Left upper extremity vascular stent extending into the axillary region. Lung volumes are slightly low. Opacity at the left base may reflect atelectasis and/or consolidation. Small to moderate left pleural effusion, slightly decreased compared to the prior study. Right lung is clear. No right pleural effusion. No pneumothorax. No definite suspicious appearing pulmonary nodules or masses. No evidence of pulmonary edema. Mild cardiomegaly. Upper mediastinal contours are within normal limits. IMPRESSION: 1. Support apparatus and postoperative changes, as above. 2. Left lower lobe atelectasis and/or consolidation with superimposed small to moderate left pleural effusion, slightly decreased compared to the prior study. Electronically Signed   By: Vinnie Langton M.D.   On: 10/27/2019 12:36   DG Ankle Complete Right  Result Date: 10/27/2019 CLINICAL DATA:  Chronic wound with cellulitis EXAM: RIGHT ANKLE - COMPLETE 3+ VIEW COMPARISON:  None. FINDINGS: Frontal, oblique, and lateral views obtained. There is evidence of old trauma involving the calcaneus with remodeling. No acute fracture or joint effusion is evident. There is narrowing of the  posterior aspect of the ankle joint. There is arthropathy throughout the subtalar joint regions. No frank erosion or bony destruction evident. IMPRESSION: Evidence of old trauma involving the calcaneus. Arthropathy throughout the subtalar joints. There is narrowing of the posterior ankle joint. No frank bony destruction appreciated by radiography. No acute fracture or joint effusion evident. If there remains concern for osteomyelitis, MR or nuclear medicine three-phase bone scan could be helpful for further assessment. Electronically Signed   By: Lowella Grip III M.D.   On: 10/27/2019 13:09   DG Abd 1 View  Result Date: 10/28/2019 CLINICAL DATA:  OG tube placement. EXAM: ABDOMEN -  1 VIEW COMPARISON:  Single-view of the abdomen 10/04/2019. FINDINGS: OG tube is in place with the tip in the distal stomach. IMPRESSION: As above. Electronically Signed   By: Inge Rise M.D.   On: 10/28/2019 11:56   CT HEAD WO CONTRAST  Result Date: 10/28/2019 CLINICAL DATA:  Cardiac arrest. EXAM: CT HEAD WITHOUT CONTRAST TECHNIQUE: Contiguous axial images were obtained from the base of the skull through the vertex without intravenous contrast. COMPARISON:  None. FINDINGS: Brain: There is no mass, hemorrhage or extra-axial collection. The size and configuration of the ventricles and extra-axial CSF spaces are normal. The brain parenchyma is normal, without acute or chronic infarction. Vascular: No abnormal hyperdensity of the major intracranial arteries or dural venous sinuses. No intracranial atherosclerosis. Skull: The visualized skull base, calvarium and extracranial soft tissues are normal. Sinuses/Orbits: No fluid levels or advanced mucosal thickening of the visualized paranasal sinuses. No mastoid or middle ear effusion. The orbits are normal. IMPRESSION: Normal head CT. Electronically Signed   By: Ulyses Jarred M.D.   On: 10/28/2019 01:50   US Venous Img Upper Uni Right(DVT)  Result Date: 10/28/2019 CLINICAL  DATA:  Dialysis patient, right arm graft removed, skin changes and swelling. EXAM: RIGHT UPPER EXTREMITY VENOUS DOPPLER ULTRASOUND TECHNIQUE: Gray-scale sonography with graded compression, as well as color Doppler and duplex ultrasound were performed to evaluate the upper extremity deep venous system from the level of the subclavian vein and including the jugular, axillary, basilic, radial, ulnar and upper cephalic vein. Spectral Doppler was utilized to evaluate flow at rest and with distal augmentation maneuvers. COMPARISON:  None. FINDINGS: Contralateral Subclavian Vein: Respiratory phasicity is normal and symmetric with the symptomatic side. No evidence of thrombus. Normal compressibility. Internal Jugular Vein: No evidence of thrombus. Normal compressibility, respiratory phasicity and response to augmentation. Subclavian Vein: No evidence of thrombus. Normal compressibility, respiratory phasicity and response to augmentation. Axillary Vein: No evidence of thrombus. Normal compressibility, respiratory phasicity and response to augmentation. Cephalic Vein: No evidence of thrombus. Normal compressibility, respiratory phasicity and response to augmentation. Basilic Vein: No evidence of thrombus. Normal compressibility, respiratory phasicity and response to augmentation. Brachial Veins: No evidence of thrombus. Normal compressibility, respiratory phasicity and response to augmentation. Radial Veins: The forearm radial vein demonstrates hypoechoic thrombus appearing occlusive. Vein noncompressible. Minor thrombus burden without propagation into the brachial vein of the upper arm. Ulnar Veins: No evidence of thrombus. Normal compressibility, respiratory phasicity and response to augmentation. IMPRESSION: Right forearm radial vein DVT. Very low thrombus burden. No propagation into the brachial vein. Electronically Signed   By: Jerilynn Mages.  Shick M.D.   On: 10/28/2019 10:31   DG Chest Port 1 View  Result Date:  10/28/2019 CLINICAL DATA:  Acute respiratory failure EXAM: PORTABLE CHEST 1 VIEW COMPARISON:  Radiograph 10/27/2019 FINDINGS: Dual lumen right IJ approach dialysis catheter tip terminates at the level of the right atrium and superior cavoatrial junction. An endotracheal tube terminates low in the trachea, approximately 1 cm from the carina. Pacer pad overlies the left chest wall. Additional support devices project over the chest. Left axillary vascular stent is again seen. Lung volumes remain diminished with bilateral patchy airspace opacities similar on the left and slightly increasing on the right when compared to prior imaging. No pneumothorax. Suspect left pleural effusion. No visible right effusion. No acute osseous or soft tissue abnormality. IMPRESSION: 1. Endotracheal tube terminates low in the trachea, approximately 1 cm from the carina. Consider retraction 2-3 cm to position in the mid trachea. 2.  Bilateral patchy airspace opacities similar on the left and slightly increasing on the right when compared to prior imaging. Worrisome for infectious consolidation versus edema. 3. Left pleural effusion. These results will be called to the ordering clinician or representative by the Radiologist Assistant, and communication documented in the PACS or zVision Dashboard. Electronically Signed   By: Lovena Le M.D.   On: 10/28/2019 03:13   ECHOCARDIOGRAM COMPLETE  Result Date: 10/29/2019   ECHOCARDIOGRAM REPORT   Patient Name:   Brett Small Date of Exam: 10/29/2019 Medical Rec #:  [id number]      Height:       61.0 in Accession #:    [id number]     Weight:       100.3 lb Date of Birth:  30-Apr-1976      BSA:          1.41 m Patient Age:    44 years       BP:           None listed/None listed mmHg Patient Gender: M              HR:           74 bpm. Exam Location:  [facility] Procedure: 2D Echo, Cardiac Doppler and Color Doppler Indications:     bacteremia 790.7  History:         Patient has prior history of  Echocardiogram examinations, most                  recent 08/04/2019. Risk Factors:Hypertension. PAD.  Sonographer:     Sherrie Sport RDCS (AE) Referring Phys:  [id number] [name] Diagnosing Phys: Bartholome Bill MD IMPRESSIONS  1. Left ventricular ejection fraction, by visual estimation, is 25 to 30%. The left ventricle has moderate to severely decreased function. Left ventricular septal wall thickness was mildly increased. Mildly increased left ventricular posterior wall thickness. There is mildly increased left ventricular hypertrophy.  2. Left ventricular diastolic parameters are consistent with Grade I diastolic dysfunction (impaired relaxation).  3. The left ventricle demonstrates global hypokinesis.  4. Global right ventricle has normal systolic function.The right ventricular size is mildly enlarged. No increase in right ventricular wall thickness.  5. Left atrial size was mildly dilated.  6. Right atrial size was mildly dilated.  7. Moderate pleural effusion in the left lateral region.  8. The pericardial effusion is circumferential.  9. Trivial pericardial effusion is present. 10. The mitral valve is grossly normal. Mild mitral valve regurgitation. 11. The tricuspid valve is grossly normal. 12. The aortic valve is tricuspid. Aortic valve regurgitation is trivial. 13. The pulmonic valve was not well visualized. Pulmonic valve regurgitation is trivial. 14. Moderately elevated pulmonary artery systolic pressure. FINDINGS  Left Ventricle: Left ventricular ejection fraction, by visual estimation, is 25 to 30%. The left ventricle has moderate to severely decreased function. The left ventricle demonstrates global hypokinesis. Mildly increased left ventricular posterior wall thickness. There is mildly increased left ventricular hypertrophy. Left ventricular diastolic parameters are consistent with Grade I diastolic dysfunction (impaired relaxation). Right Ventricle: The right ventricular size is mildly enlarged. No  increase in right ventricular wall thickness. Global RV systolic function is has normal systolic function. The tricuspid regurgitant velocity is 2.79 m/s, and with an assumed right atrial  pressure of 10 mmHg, the estimated right ventricular systolic pressure is moderately elevated at 41.1 mmHg. Left Atrium: Left atrial size was mildly dilated. Right Atrium: Right atrial size was mildly dilated Pericardium: Trivial  pericardial effusion is present. The pericardial effusion is circumferential. There is no evidence of cardiac tamponade. There is a moderate pleural effusion in the left lateral region. Mitral Valve: The mitral valve is grossly normal. Mild mitral valve regurgitation. There is no evidence of mitral valve vegetation. Tricuspid Valve: The tricuspid valve is grossly normal. Tricuspid valve regurgitation is mild. Aortic Valve: The aortic valve is tricuspid. Aortic valve regurgitation is trivial. Aortic valve mean gradient measures 2.0 mmHg. Aortic valve peak gradient measures 4.2 mmHg. Aortic valve area, by VTI measures 1.81 cm. Pulmonic Valve: The pulmonic valve was not well visualized. Pulmonic valve regurgitation is trivial. Pulmonic regurgitation is trivial. Aorta: The aortic root is normal in size and structure. IAS/Shunts: No atrial level shunt detected by color flow Doppler.  LEFT VENTRICLE PLAX 2D LVIDd:         5.16 cm       Diastology LVIDs:         4.48 cm       LV e' lateral:   6.42 cm/s LV PW:         0.91 cm       LV E/e' lateral: 11.0 LV IVS:        0.99 cm       LV e' medial:    4.57 cm/s LVOT diam:     2.00 cm       LV E/e' medial:  15.4 LV SV:         36 ml LV SV Index:   25.63 LVOT Area:     3.14 cm  LV Volumes (MOD) LV area d, A4C:    22.10 cm LV area s, A4C:    20.70 cm LV major d, A4C:   5.96 cm LV major s, A4C:   6.08 cm LV vol d, MOD A4C: 67.5 ml LV vol s, MOD A4C: 58.7 ml LV SV MOD A4C:     67.5 ml RIGHT VENTRICLE RV Basal diam:  3.07 cm RV S prime:     8.49 cm/s TAPSE (M-mode):  2.8 cm LEFT ATRIUM           Index       RIGHT ATRIUM           Index LA diam:      3.35 cm 2.38 cm/m  RA Area:     16.40 cm LA Vol (A4C): 30.0 ml 21.30 ml/m RA Volume:   45.00 ml  31.95 ml/m  AORTIC VALVE                   PULMONIC VALVE AV Area (Vmax):    1.71 cm    PV Vmax:        0.47 m/s AV Area (Vmean):   1.89 cm    PV Peak grad:   0.9 mmHg AV Area (VTI):     1.81 cm    RVOT Peak grad: 3 mmHg AV Vmax:           103.00 cm/s AV Vmean:          63.450 cm/s AV VTI:            0.138 m AV Peak Grad:      4.2 mmHg AV Mean Grad:      2.0 mmHg LVOT Vmax:         56.20 cm/s LVOT Vmean:        38.200 cm/s LVOT VTI:          0.079 m  LVOT/AV VTI ratio: 0.58  AORTA Ao Root diam: 2.60 cm MITRAL VALVE                        TRICUSPID VALVE MV Area (PHT): 3.89 cm             TR Peak grad:   31.1 mmHg MV PHT:        56.55 msec           TR Vmax:        289.00 cm/s MV Decel Time: 195 msec MV E velocity: 70.30 cm/s 103 cm/s  SHUNTS MV A velocity: 36.40 cm/s 70.3 cm/s Systemic VTI:  0.08 m MV E/A ratio:  1.93       1.5       Systemic Diam: 2.00 cm  Bartholome Bill MD Electronically signed by Bartholome Bill MD Signature Date/Time: 10/29/2019/10:46:08 AM    Final      Medications:   .  prismasol BGK 4/2.5 400 mL/hr at 10/29/19 0545  .  prismasol BGK 4/2.5 400 mL/hr at 10/29/19 0545  . sodium chloride Stopped (10/29/19 1014)  . anticoagulant sodium citrate    . argatroban 0.15 mcg/kg/min (10/29/19 1100)  . calcium gluconate    . calcium gluconate    . ceFEPime (MAXIPIME) IV Stopped (10/29/19 1012)  . dexmedetomidine (PRECEDEX) IV infusion 0.4 mcg/kg/hr (10/29/19 1100)  . DOBUTamine    . doxycycline (VIBRAMYCIN) IV 125 mL/hr at 10/29/19 1100  . feeding supplement (VITAL AF 1.2 CAL) 45 mL/hr at 10/29/19 0700  . norepinephrine (LEVOPHED) Adult infusion 1 mcg/min (10/29/19 1100)  . phenylephrine (NEO-SYNEPHRINE) Adult infusion    . prismasol BGK 4/2.5 1,000 mL/hr at 10/29/19 1055  . vancomycin Stopped (10/28/19  1410)  . vasopressin (PITRESSIN) infusion - *FOR SHOCK* Stopped (10/27/19 2209)   . B-complex with vitamin C  1 tablet Per Tube QHS  . chlorhexidine gluconate (MEDLINE KIT)  15 mL Mouth Rinse BID  . Chlorhexidine Gluconate Cloth  6 each Topical Daily  . feeding supplement (PRO-STAT SUGAR FREE 64)  30 mL Per Tube Daily  . hydrocortisone sod succinate (SOLU-CORTEF) inj  50 mg Intravenous Q6H  . insulin aspart  0-9 Units Subcutaneous Q6H  . mouth rinse  15 mL Mouth Rinse 10 times per day  . mupirocin ointment  1 application Nasal BID  . pantoprazole sodium  40 mg Per Tube QHS  . patiromer  8.4 g Oral Daily  . sodium chloride flush  10-40 mL Intracatheter Q12H   sodium chloride, acetaminophen **OR** acetaminophen, anticoagulant sodium citrate, bisacodyl, fentaNYL (SUBLIMAZE) injection, LORazepam, magnesium citrate, ondansetron **OR** ondansetron (ZOFRAN) IV, polyethylene glycol, sodium chloride flush, traMADol, vecuronium  Assessment/ Plan:  44 y.o. male with end-stage renal disease on hemodialysis, hypertension, peripheral vascular disease, depression, history of PEA event October 2020, tobacco abuse, cardiopulmonary arrest times 10/27/19  [facility] [address]/TTS/9 IJ catheter  1.  ESRD on HD TTS.  Patient unfortunately suffered cardiopulmonary arrest upon admission.  Blood pressure was low and patient had severe acidosis.   -CRRT initiated 10/28/2019.  Maintain the patient on CRRT.  We will add patient fluid removal of 25 cc/h.  2.  Acute respiratory failure with cardiopulmonary arrest.  Total downtime was 21 minutes between the 2 codes.  -Maintain the patient on ventilatory support at this time.  3.  Anemia of chronic kidney disease.  Hemoglobin currently 8.2.  No indication for transfusion.  4.  Secondary hyperparathyroidism.  Phosphorus now  down to 3.2.  Continue periodically monitor is a good trend lower given CRRT.     LOS: 2 Kimbly Eanes 1/7/202112:10 PM  [place], [place]  Note: This note was prepared with Dragon dictation. Any transcription errors are unintentional

## 2019-10-29 NOTE — Progress Notes (Signed)
[facility] Daily Progress Note   Subjective: Patient intubated and sedated. No issues overnight.   Objective: Vitals:   10/29/19 1300 10/29/19 1400 10/29/19 1500 10/29/19 1600  BP:      Pulse: 95 93 92 81  Resp: (!) 23 18 18 15   Temp:      TempSrc:      SpO2: 93% 100% 100% 96%  Weight:      Height:        Intake/Output Summary (Last 24 hours) at 10/29/2019 1608 Last data filed at 10/29/2019 1600 Gross per 24 hour  Intake 2413.77 ml  Output 2554.6 ml  Net -140.83 ml   Physical Exam: Sedated and intubated, NAD Chest:   Right IJ Permcath: Intact, clean and dry CV: RRR Pulmonary: Diminished bilaterally Abdomen: Soft, Nontender, Nondistended Vascular:  RUE: Hand cold and mottled. Unable to palpate radial or ulnar pulses.  Bilateral Lower Extremity: Unable to palpate pedal pulses. Feet are cold. Left toes with pallor.   Laboratory: CBC    Component Value Date/Time   WBC 15.9 (H) 10/29/2019 0206   HGB 8.2 (L) 10/29/2019 0206   HCT 26.7 (L) 10/29/2019 0206   PLT 79 (L) 10/29/2019 0206   BMET    Component Value Date/Time   NA 138 10/29/2019 0409   K 4.6 10/29/2019 0409   CL 101 10/29/2019 0409   CO2 25 10/29/2019 0409   GLUCOSE 189 (H) 10/29/2019 0409   BUN 32 (H) 10/29/2019 0409   CREATININE 2.02 (H) 10/29/2019 0409   CALCIUM 8.1 (L) 10/29/2019 0409   GFRNONAA 39 (L) 10/29/2019 0409   GFRAA 45 (L) 10/29/2019 0409   Assessment/Planning: The patient is a 44 year old male with multiple medical issues well-known to our service who presented to the [facility] emergency department with a chief complaint of progressively worsening generalized weakness.  Unfortunately, the patient decompensated and subsequently went into PEA arrest.  CPR was performed, he was intubated and ROSC obtained now intubated and sedated.  1.  Right upper extremity ischemia:  Combination of hypoperfusion from sepsis, recent code, and severe PAD.  Unfortunately, patient is acutely ill and will not tolerate revascularization. Now 48 hours with ischemia to hand - doubtful would improve with any intervention at this point. Patient is at high risk for forearm amputation at a minimum.   2. Right Upper Extremity DVT: Not the cause of hypoperfusion to right hand. No indication for thrombolysis / thrombectomy.  RUE Venous Duplex (10/28/19):  Right forearm radial vein DVT. Very low thrombus burden. No propagation into the brachial vein  3.  End-stage renal disease on chronic hemodialysis: The patient is currently being maintained by a right IJ PermCath.  His PermCath seems to be functioning well.  Currently undergoing CRRT.  Patient is being followed by nephrology.   Of note: Sutured permcath to chest. Permcath not in place however cuff is not exposed. If patient becomes stable would recommend exchange.   Discussed with Dr. Eber Hong Jehan Bonano PA-C 10/29/2019 4:08 PM

## 2019-10-29 NOTE — Progress Notes (Signed)
*  PRELIMINARY RESULTS* Echocardiogram 2D Echocardiogram has been performed.  Sherrie Sport 10/29/2019, 10:21 AM

## 2019-10-29 NOTE — Progress Notes (Addendum)
CRITICAL CARE PROGRESS NOTE    Name: Knoah Nedeau MRN: [id number] DOB: [birth year]     LOS: 2   SUBJECTIVE FINDINGS & SIGNIFICANT EVENTS   Patient description: Patient is a 44 yo male with a history of ESRD, HTN, right arm DVT in 09/2019, HFrEF<20%, and NSTEMI in 06/2019 who presented to the ED on 10/27/2019 for chest pain, SOB, productive cough and N/V.  He was scheduled for dialysis 10/27/2019, but reported that he felt too ill to go, and presented to the ED instead.    At the ED, patient was found to have sepsis due to pneumonia, NSTEMI, and metabolic acidosis.    He was admitted by the hospitalist team, but while awaiting a room, he went into cardiac arrest in the ED.  CPR was initiated with PEA, and patient was intubated, and given 2 rounds of epinephrine and calcium gluconate.  First code 11 minutes, second code 10 minutes.  He was then admitted to the ICU on 10/26/2018 for severe acute hypoxic respiratory failure, and acute cardiac failure.  On 10/28/2019, patient developed edema, erythema in right hand, doppler ultrasound consistent with right forearm radial vein DVT.   Lines / Drains: ET tube on vent x 1 day Art line Right femoral NG/OG tube CVC triple lumen right femoral  Cultures / Sepsis markers: WBC rising from 8.7 yesterday to 15.9 today.  Procalcitonin 2.14 but improving.    Nasopharyngeal swab positive for MRSA.  Blood culture on 10/26/2018 consistent with gram positive rods.  Considered possible contaminant and culture discontinued.  Will repeat cultures.  Antibiotics: Vancomycin Doxycycline Cefepime   Protocols / Consultants: Nephrology  Vascular surgery Cardiology Neurology   Tests / Events: 01/05: Patient presented to ED with SOB, emesis, chest pain.  ED workup consistent with  NSTEMI and pneumonia with sepsis.  Patient coded twice due to PEA in ED for a combined time of 21 minutes.  Admitted to ICU. 01/06: Patient's right hand cold and mottled concerning for right forearm DVT.  Venous doppler ultrasound consistent with right forearm radial vein DVT. EEG demonstrated nonspecific bitemporal cortical dysfunction and severe diffuse encephalopathy.    CC follow up severe resp failure  HPI S/p cardiac arrest S/p NSTEMI Critically ill Sepsis from pneumonia ESRD on CRRT  PAST MEDICAL HISTORY   Past Medical History:  Diagnosis Date  . ESRD (end stage renal disease) ([place])   . Hypertension   . PAD (peripheral artery disease) ([facility])    Required aortofemoral stent-which had closed and had to redo the procedure and ischemia of limb.  . Peripheral vascular disease ([place])   . Renal disorder   . Secondary hyperparathyroidism of renal origin [facility])      SURGICAL HISTORY   Past Surgical History:  Procedure Laterality Date  . A/V SHUNT INTERVENTION Left 01/19/2019   Procedure: LEFT UPPER EXTREMITY A/V SHUNTOGRAM / UPPER EXTREMITY ANGIOGRAM;  Surgeon: Algernon Huxley, MD;  Location: [place] [facility];  Service: Cardiovascular;  Laterality: Left;  . A/V SHUNT INTERVENTION Left 03/02/2019   Procedure: A/V SHUNT INTERVENTION;  Surgeon: Algernon Huxley, MD;  Location: [place] [facility];  Service: Cardiovascular;  Laterality: Left;  . AORTA - FEMORAL ARTERY BYPASS GRAFT    . AV FISTULA PLACEMENT Left 12/26/2018   Procedure: INSERTION OF GORE STRETCH VASCULAR 4-7MM X  45CM IN LEFT UPPER ARM;  Surgeon: Marty Heck, MD;  Location: [place];  Service: Vascular;  Laterality: Left;  . AV FISTULA PLACEMENT Right 08/12/2019  Procedure: INSERTION OF ARTERIOVENOUS (AV) GORE-TEX GRAFT ARM;  Surgeon: Algernon Huxley, MD;  Location: [facility] ORS;  Service: Vascular;  Laterality: Right;  . DIALYSIS/PERMA CATHETER REMOVAL N/A 02/06/2019   Procedure: DIALYSIS/PERMA CATHETER REMOVAL;  Surgeon: Algernon Huxley, MD;  Location: [place] [facility];  Service: Cardiovascular;  Laterality: N/A;  . ESOPHAGOGASTRODUODENOSCOPY N/A 10/07/2019   Procedure: ESOPHAGOGASTRODUODENOSCOPY (EGD);  Surgeon: Lin Landsman, MD;  Location: [facility];  Service: Gastroenterology;  Laterality: N/A;  . LEFT HEART CATH AND CORONARY ANGIOGRAPHY Right 07/10/2019   Procedure: LEFT HEART CATH AND CORONARY ANGIOGRAPHY;  Surgeon: Dionisio David, MD;  Location: [place] [facility];  Service: Cardiovascular;  Laterality: Right;  . PERIPHERAL VASCULAR THROMBECTOMY Left 07/28/2019   Procedure: Left Upper Extremity Dialysis Access [place];  Surgeon: Katha Cabal, MD;  Location: [place] [facility];  Service: Cardiovascular;  Laterality: Left;  . REMOVAL OF A DIALYSIS CATHETER Right 08/26/2019   Procedure: REMOVAL OF A DIALYSIS CATHETER;  Surgeon: Algernon Huxley, MD;  Location: [facility] ORS;  Service: Vascular;  Laterality: Right;  . TEMPORARY DIALYSIS CATHETER N/A 08/25/2019   Procedure: TEMPORARY DIALYSIS CATHETER;  Surgeon: Katha Cabal, MD;  Location: [place] [facility];  Service: Cardiovascular;  Laterality: N/A;  . UPPER EXTREMITY ANGIOGRAPHY Left 08/13/2019   Procedure: UPPER EXTREMITY ANGIOGRAPHy;  Surgeon: Algernon Huxley, MD;  Location: [place] [facility];  Service: Cardiovascular;  Laterality: Left;     FAMILY HISTORY   Family History  Problem Relation Age of Onset  . Hypertension Other   . Diabetes Other   . Clotting disorder Father      SOCIAL HISTORY   Social History   Tobacco Use  . Smoking status: Current Every Day Smoker    Packs/day: 0.50    Years: 30.00    Pack years: 15.00    Types: Cigarettes    Last attempt to quit: 06/26/2018    Years since quitting: 1.3  . Smokeless tobacco: Never Used  . Tobacco comment: smoked for 30 years   Substance Use Topics  . Alcohol use: Not Currently  . Drug use: Yes    Frequency: 1.0 times per week    Types: Marijuana    Comment: Pt states  "maybe once or twice a week".      MEDICATIONS   Current Medication:  Current Facility-Administered Medications:  .   prismasol BGK 4/2.5 infusion, , CRRT, Continuous, Lateef, Munsoor, MD, Last Rate: 400 mL/hr at 10/29/19 0545, New Bag at 10/29/19 0545 .   prismasol BGK 4/2.5 infusion, , CRRT, Continuous, Lateef, Munsoor, MD, Last Rate: 400 mL/hr at 10/29/19 0545, New Bag at 10/29/19 0545 .  0.9 %  sodium chloride infusion (Manually program via Guardrails IV Fluids), , Intravenous, Once, Darel Hong D, NP .  acetaminophen (TYLENOL) tablet 650 mg, 650 mg, Oral, Q6H PRN **OR** acetaminophen (TYLENOL) suppository 650 mg, 650 mg, Rectal, Q6H PRN, Nicole Kindred A, DO .  anticoagulant sodium citrate solution 5 mL, 5 mL, Intravenous, PRN, Kolluru, Sarath, MD .  argatroban 1 mg/mL infusion, 0.15 mcg/kg/min, Intravenous, Continuous, Gerald Dexter, RPH, Last Rate: 0.42 mL/hr at 10/29/19 0830, 0.15 mcg/kg/min at 10/29/19 0830 .  B-complex with vitamin C tablet 1 tablet, 1 tablet, Per Tube, QHS, Tyler Pita, MD, 1 tablet at 10/28/19 2123 .  bisacodyl (DULCOLAX) EC tablet 5 mg, 5 mg, Oral, Daily PRN, Nicole Kindred A, DO .  calcium gluconate 1 g/ 50 mL sodium chloride IVPB, 1  g, Intravenous, Once, Gregor Hams, MD .  calcium gluconate 1 g/ 50 mL sodium chloride IVPB, 1 g, Intravenous, Once, Nicole Kindred A, DO .  ceFEPIme (MAXIPIME) 2 g in sodium chloride 0.9 % 100 mL IVPB, 2 g, Intravenous, Q12H, Gerald Dexter, [facility], Stopped at 10/28/19 2150 .  chlorhexidine gluconate (MEDLINE KIT) (PERIDEX) 0.12 % solution 15 mL, 15 mL, Mouth Rinse, BID, Taneisha Fuson, MD, 15 mL at 10/29/19 0738 .  Chlorhexidine Gluconate Cloth 2 % PADS 6 each, 6 each, Topical, Daily, Darel Hong D, NP, 6 each at 10/28/19 1102 .  dexmedetomidine (PRECEDEX) 200 MCG/50ML (4 mcg/mL) infusion, 0.4-1.2 mcg/kg/hr, Intravenous, Titrated, Nicole Kindred A, DO, Last Rate: 4.68 mL/hr at 10/29/19 0800, 0.4  mcg/kg/hr at 10/29/19 0800 .  doxycycline (VIBRAMYCIN) 200 mg in dextrose 5 % 250 mL IVPB, 200 mg, Intravenous, Q12H, Tyler Pita, MD, Stopped at 10/28/19 2350 .  feeding supplement (PRO-STAT SUGAR FREE 64) liquid 30 mL, 30 mL, Per Tube, Daily, Tyler Pita, MD, 30 mL at 10/28/19 1313 .  feeding supplement (VITAL AF 1.2 CAL) liquid 1,000 mL, 1,000 mL, Per Tube, Continuous, Tyler Pita, MD, Last Rate: 45 mL/hr at 10/29/19 0700, Rate Verify at 10/29/19 0700 .  fentaNYL (SUBLIMAZE) injection 100 mcg, 100 mcg, Intravenous, Q1H PRN, Flora Lipps, MD, 100 mcg at 10/27/19 1905 .  hydrocortisone sodium succinate (SOLU-CORTEF) 100 MG injection 50 mg, 50 mg, Intravenous, Q6H, Darel Hong D, NP, 50 mg at 10/29/19 0801 .  insulin aspart (novoLOG) injection 0-9 Units, 0-9 Units, Subcutaneous, Q6H, Dallie Piles, RPH .  LORazepam (ATIVAN) injection 2-4 mg, 2-4 mg, Intravenous, Q4H PRN, Flora Lipps, MD, 4 mg at 10/27/19 1905 .  magnesium citrate solution 1 Bottle, 1 Bottle, Oral, Once PRN, Nicole Kindred A, DO .  MEDLINE mouth rinse, 15 mL, Mouth Rinse, 10 times per day, Flora Lipps, MD, 15 mL at 10/29/19 0600 .  mupirocin ointment (BACTROBAN) 2 % 1 application, 1 application, Nasal, BID, Tyler Pita, MD, 1 application at 16/10/96 2123 .  norepinephrine (LEVOPHED) 16 mg in 215m premix infusion, 0-40 mcg/min, Intravenous, Titrated, KDarel HongD, NP, Last Rate: 1.41 mL/hr at 10/29/19 0800, 1.5 mcg/min at 10/29/19 0800 .  ondansetron (ZOFRAN) tablet 4 mg, 4 mg, Oral, Q6H PRN **OR** ondansetron (ZOFRAN) injection 4 mg, 4 mg, Intravenous, Q6H PRN, GNicole KindredA, DO, 4 mg at 10/27/19 1656 .  pantoprazole sodium (PROTONIX) 40 mg/20 mL oral suspension 40 mg, 40 mg, Per Tube, QHS, GTyler Pita MD, 40 mg at 10/28/19 2123 .  patiromer (Daryll Drown packet 8.4 g, 8.4 g, Oral, Daily, BGregor Hams MD, 8.4 g at 10/28/19 1500 .  phenylephrine (NEO-SYNEPHRINE) 10 mg in sodium  chloride 0.9 % 250 mL (0.04 mg/mL) infusion, 0-400 mcg/min, Intravenous, Titrated, Filiberto Wamble, MD .  polyethylene glycol (MIRALAX / GLYCOLAX) packet 17 g, 17 g, Oral, Daily PRN, GNicole KindredA, DO .  prismasol BGK 4/2.5 infusion, , CRRT, Continuous, Lateef, Munsoor, MD, Last Rate: 1,000 mL/hr at 10/29/19 0545, New Bag at 10/29/19 0545 .  sodium chloride flush (NS) 0.9 % injection 10-40 mL, 10-40 mL, Intracatheter, Q12H, KDarel HongD, NP, 10 mL at 10/28/19 2150 .  sodium chloride flush (NS) 0.9 % injection 10-40 mL, 10-40 mL, Intracatheter, PRN, KBradly Bienenstock NP .  traMADol (Veatrice Bourbon tablet 50 mg, 50 mg, Oral, Q12H PRN, GNicole KindredA, DO .  vancomycin (VANCOREADY) IVPB 500 mg/100 mL, 500 mg, Intravenous, Q24H, MGerald Dexter  RPH, Stopped at 10/28/19 1410 .  vasopressin (PITRESSIN) 40 Units in sodium chloride 0.9 % 250 mL (0.16 Units/mL) infusion, 0.03 Units/min, Intravenous, Continuous, [name], NP, Stopped at 10/27/19 2209 .  vecuronium (NORCURON) injection 10 mg, 10 mg, Intravenous, Q1H PRN, Flora Lipps, MD, 10 mg at 10/27/19 1906    ALLERGIES   Codeine, Sulfa antibiotics, and Heparin    REVIEW OF SYSTEMS   Unable to obtain due to severity of patient illness.   PHYSICAL EXAMINATION   Vital Signs: Temp:  [97.4 F (36.3 C)-98.2 F (36.8 C)] 97.4 F (36.3 C) (01/07 0736) Pulse Rate:  [65-75] 73 (01/07 0800) Resp:  [0-21] 21 (01/07 0800) BP: (86-121)/(56-81) 118/77 (01/07 0300) SpO2:  [98 %-100 %] 100 % (01/07 0800) Arterial Line BP: (84-134)/(53-85) 118/71 (01/07 0800) FiO2 (%):  [40 %-50 %] 40 % (01/07 0800) Weight:  [45.5 kg] 45.5 kg (01/07 0500)  GENERAL:Patient sedated, moves head in response to pain.   HEAD: Normocephalic, atraumatic.  EYES: Pupils equal, round, reactive to light.  No scleral icterus.  MOUTH: Moist mucosal membrane. NECK: Supple. No thyromegaly. No nodules. No JVD.  PULMONARY: Diminished lung sounds at bilateral bases.   No rhonchi or wheezing. CARDIOVASCULAR: S1 and S2. Regular rate and rhythm. No murmurs, rubs, or gallops.  GASTROINTESTINAL: Soft, nontender, non-distended. No masses. Hypoactive bowel sounds. No hepatosplenomegaly.  MUSCULOSKELETAL: Right hand and wrist cold, edematous, erythematous and mottled consistent with right radial DVT.  No lower extremity edema.   NEUROLOGIC: Mild distress due to acute illness SKIN:intact,warm,dry   PERTINENT DATA     Infusions: .  prismasol BGK 4/2.5 400 mL/hr at 10/29/19 0545  .  prismasol BGK 4/2.5 400 mL/hr at 10/29/19 0545  . anticoagulant sodium citrate    . argatroban 0.15 mcg/kg/min (10/29/19 0830)  . calcium gluconate    . calcium gluconate    . ceFEPime (MAXIPIME) IV Stopped (10/28/19 2150)  . dexmedetomidine (PRECEDEX) IV infusion 0.4 mcg/kg/hr (10/29/19 0800)  . doxycycline (VIBRAMYCIN) IV Stopped (10/28/19 2350)  . feeding supplement (VITAL AF 1.2 CAL) 45 mL/hr at 10/29/19 0700  . norepinephrine (LEVOPHED) Adult infusion 1.5 mcg/min (10/29/19 0800)  . phenylephrine (NEO-SYNEPHRINE) Adult infusion    . prismasol BGK 4/2.5 1,000 mL/hr at 10/29/19 0545  . vancomycin Stopped (10/28/19 1410)  . vasopressin (PITRESSIN) infusion - *FOR SHOCK* Stopped (10/27/19 2209)   Scheduled Medications: . sodium chloride   Intravenous Once  . B-complex with vitamin C  1 tablet Per Tube QHS  . chlorhexidine gluconate (MEDLINE KIT)  15 mL Mouth Rinse BID  . Chlorhexidine Gluconate Cloth  6 each Topical Daily  . feeding supplement (PRO-STAT SUGAR FREE 64)  30 mL Per Tube Daily  . hydrocortisone sod succinate (SOLU-CORTEF) inj  50 mg Intravenous Q6H  . insulin aspart  0-9 Units Subcutaneous Q6H  . mouth rinse  15 mL Mouth Rinse 10 times per day  . mupirocin ointment  1 application Nasal BID  . pantoprazole sodium  40 mg Per Tube QHS  . patiromer  8.4 g Oral Daily  . sodium chloride flush  10-40 mL Intracatheter Q12H   PRN Medications: acetaminophen **OR**  acetaminophen, anticoagulant sodium citrate, bisacodyl, fentaNYL (SUBLIMAZE) injection, LORazepam, magnesium citrate, ondansetron **OR** ondansetron (ZOFRAN) IV, polyethylene glycol, sodium chloride flush, traMADol, vecuronium Hemodynamic parameters:   Intake/Output: 01/06 0701 - 01/07 0700 In: 1906.4 [I.V.:221.3; NG/GT:935.3; IV Piggyback:749.9] Out: 1333   Ventilator  Settings: Vent Mode: PRVC FiO2 (%):  [40 %-50 %] 40 % Set  Rate:  [14 bmp] 14 bmp Vt Set:  [400 mL] 400 mL PEEP:  [5 cmH20] 5 cmH20   Other Labs:  LAB RESULTS:  Basic Metabolic Panel: Recent Labs  Lab 10/27/19 1307 10/27/19 1823 10/28/19 0445 10/28/19 1443 10/29/19 0206 10/29/19 0409  NA 131* 131* 132* 136  --  138  K 6.6* 4.8 4.7 4.9  --  4.6  CL 89* 99 87* 98  --  101  CO2 18* 11* 30 28  --  25  GLUCOSE 107* 104* 125* 114*  --  189*  BUN 70* 60* 72* 44*  --  32*  CREATININE 6.34* 5.18* 5.80* 3.49*  --  2.02*  CALCIUM 9.2 >15.0* 8.2* 7.9*  --  8.1*  MG  --   --  1.8  --  2.4  --   PHOS  --   --  8.3* 5.2*  --  3.2   Liver Function Tests: Recent Labs  Lab 10/27/19 1823 10/28/19 0445 10/28/19 1443 10/29/19 0409  AST 29 37  --   --   ALT 14 19  --   --   ALKPHOS 63 58  --   --   BILITOT 1.9* 2.5*  --   --   PROT 5.0* 4.3*  --   --   ALBUMIN 2.1* 2.0* 1.9* 2.1*   No results for input(s): LIPASE, AMYLASE in the last 168 hours. No results for input(s): AMMONIA in the last 168 hours. CBC: Recent Labs  Lab 10/27/19 1307 10/27/19 1823 10/28/19 0445 10/28/19 0756 10/29/19 0206  WBC 13.3* 14.3* 9.7 8.7 15.9*  NEUTROABS  --   --   --  7.7  --   HGB 9.7* 9.0* 7.0* 7.3* 8.2*  HCT 31.6* 31.5* 21.7* 22.4* 26.7*  MCV 94.9 100.0 90.8 91.8 94.3  PLT 133* 71* 49* 50* 79*   Cardiac Enzymes: No results for input(s): CKTOTAL, CKMB, CKMBINDEX, TROPONINI in the last 168 hours. BNP: Invalid input(s): POCBNP CBG: Recent Labs  Lab 10/28/19 1950 10/29/19 0007 10/29/19 0010 10/29/19 0410  10/29/19 0752  GLUCAP 143* 200* 167* 172* 193*     IMAGING RESULTS:  Imaging: EEG  Result Date: 10/28/2019 Lora Havens, MD     10/28/2019  3:18 PM Patient Name: Iva Posten MRN: [id number] Epilepsy Attending: Lora Havens Referring Physician/Provider: Darel Hong, NP Date: 10/27/2018 Duration: 27.57 minutes Patient history: 44 year old male with history of seizure disorder now status post cardiac arrest.  EEG to evaluate for seizures Level of alertness: Comatose/sedated AEDs during EEG study: None Technical aspects: This EEG study was done with scalp electrodes positioned according to the 10-20 International system of electrode placement. Electrical activity was acquired at a sampling rate of '500Hz'  and reviewed with a high frequency filter of '70Hz'  and a low frequency filter of '1Hz' . EEG data were recorded continuously and digitally stored. Description: EEG showed continuous generalized, maximal bitemporal 3 to 6 Hz theta-delta slowing.  EEG was reactive to noxious stimuli.  Hyperventilation photic summation were not performed. Abnormality -Continuous slow, generalized, maximal bitemporal IMPRESSION: This study is suggestive of nonspecific bitemporal cortical dysfunction as well as severe diffuse encephalopathy, nonspecific to etiology but could be secondary to sedation. No seizures or epileptiform discharges were seen throughout the recording. Lora Havens   DG Chest 1 View  Result Date: 10/27/2019 CLINICAL DATA:  Difficult intubation status post CPR EXAM: CHEST  1 VIEW COMPARISON:  October 27, 2019 at 12:19 p.m. FINDINGS: The heart size remains enlarged. There is  a small to moderate-sized left-sided pleural effusion. There is a small right-sided pleural effusion. There is diffuse increased attenuation within the left lung field. Prominent interstitial lung markings are noted. There is a stable tunneled dialysis catheter on the right. The endotracheal tube terminates approximately 3 cm  above the carina. There is no pneumothorax. IMPRESSION: 1. Lines and tubes as above. 2. Persistent bilateral pleural effusions, left greater than right. 3. Worsening left-sided airspace disease concerning for pneumonia. Electronically Signed   By: Constance Holster M.D.   On: 10/27/2019 20:07   DG Chest 2 View  Result Date: 10/27/2019 CLINICAL DATA:  44 year old male with history of chest pain. Vomiting for the past 2 weeks. Diarrhea since yesterday. EXAM: CHEST - 2 VIEW COMPARISON:  Chest x-ray 10/03/2019. FINDINGS: Right internal jugular PermCath with tips terminating in the right atrium and superior cavoatrial junction. Left upper extremity vascular stent extending into the axillary region. Lung volumes are slightly low. Opacity at the left base may reflect atelectasis and/or consolidation. Small to moderate left pleural effusion, slightly decreased compared to the prior study. Right lung is clear. No right pleural effusion. No pneumothorax. No definite suspicious appearing pulmonary nodules or masses. No evidence of pulmonary edema. Mild cardiomegaly. Upper mediastinal contours are within normal limits. IMPRESSION: 1. Support apparatus and postoperative changes, as above. 2. Left lower lobe atelectasis and/or consolidation with superimposed small to moderate left pleural effusion, slightly decreased compared to the prior study. Electronically Signed   By: Vinnie Langton M.D.   On: 10/27/2019 12:36   DG Ankle Complete Right  Result Date: 10/27/2019 CLINICAL DATA:  Chronic wound with cellulitis EXAM: RIGHT ANKLE - COMPLETE 3+ VIEW COMPARISON:  None. FINDINGS: Frontal, oblique, and lateral views obtained. There is evidence of old trauma involving the calcaneus with remodeling. No acute fracture or joint effusion is evident. There is narrowing of the posterior aspect of the ankle joint. There is arthropathy throughout the subtalar joint regions. No frank erosion or bony destruction evident. IMPRESSION:  Evidence of old trauma involving the calcaneus. Arthropathy throughout the subtalar joints. There is narrowing of the posterior ankle joint. No frank bony destruction appreciated by radiography. No acute fracture or joint effusion evident. If there remains concern for osteomyelitis, MR or nuclear medicine three-phase bone scan could be helpful for further assessment. Electronically Signed   By: Lowella Grip III M.D.   On: 10/27/2019 13:09   DG Abd 1 View  Result Date: 10/28/2019 CLINICAL DATA:  OG tube placement. EXAM: ABDOMEN - 1 VIEW COMPARISON:  Single-view of the abdomen 10/04/2019. FINDINGS: OG tube is in place with the tip in the distal stomach. IMPRESSION: As above. Electronically Signed   By: Inge Rise M.D.   On: 10/28/2019 11:56   CT HEAD WO CONTRAST  Result Date: 10/28/2019 CLINICAL DATA:  Cardiac arrest. EXAM: CT HEAD WITHOUT CONTRAST TECHNIQUE: Contiguous axial images were obtained from the base of the skull through the vertex without intravenous contrast. COMPARISON:  None. FINDINGS: Brain: There is no mass, hemorrhage or extra-axial collection. The size and configuration of the ventricles and extra-axial CSF spaces are normal. The brain parenchyma is normal, without acute or chronic infarction. Vascular: No abnormal hyperdensity of the major intracranial arteries or dural venous sinuses. No intracranial atherosclerosis. Skull: The visualized skull base, calvarium and extracranial soft tissues are normal. Sinuses/Orbits: No fluid levels or advanced mucosal thickening of the visualized paranasal sinuses. No mastoid or middle ear effusion. The orbits are normal. IMPRESSION: Normal head CT. Electronically  Signed   By: Ulyses Jarred M.D.   On: 10/28/2019 01:50   US Venous Img Upper Uni Right(DVT)  Result Date: 10/28/2019 CLINICAL DATA:  Dialysis patient, right arm graft removed, skin changes and swelling. EXAM: RIGHT UPPER EXTREMITY VENOUS DOPPLER ULTRASOUND TECHNIQUE: Gray-scale  sonography with graded compression, as well as color Doppler and duplex ultrasound were performed to evaluate the upper extremity deep venous system from the level of the subclavian vein and including the jugular, axillary, basilic, radial, ulnar and upper cephalic vein. Spectral Doppler was utilized to evaluate flow at rest and with distal augmentation maneuvers. COMPARISON:  None. FINDINGS: Contralateral Subclavian Vein: Respiratory phasicity is normal and symmetric with the symptomatic side. No evidence of thrombus. Normal compressibility. Internal Jugular Vein: No evidence of thrombus. Normal compressibility, respiratory phasicity and response to augmentation. Subclavian Vein: No evidence of thrombus. Normal compressibility, respiratory phasicity and response to augmentation. Axillary Vein: No evidence of thrombus. Normal compressibility, respiratory phasicity and response to augmentation. Cephalic Vein: No evidence of thrombus. Normal compressibility, respiratory phasicity and response to augmentation. Basilic Vein: No evidence of thrombus. Normal compressibility, respiratory phasicity and response to augmentation. Brachial Veins: No evidence of thrombus. Normal compressibility, respiratory phasicity and response to augmentation. Radial Veins: The forearm radial vein demonstrates hypoechoic thrombus appearing occlusive. Vein noncompressible. Minor thrombus burden without propagation into the brachial vein of the upper arm. Ulnar Veins: No evidence of thrombus. Normal compressibility, respiratory phasicity and response to augmentation. IMPRESSION: Right forearm radial vein DVT. Very low thrombus burden. No propagation into the brachial vein. Electronically Signed   By: Jerilynn Mages.  Shick M.D.   On: 10/28/2019 10:31   DG Chest Port 1 View  Result Date: 10/28/2019 CLINICAL DATA:  Acute respiratory failure EXAM: PORTABLE CHEST 1 VIEW COMPARISON:  Radiograph 10/27/2019 FINDINGS: Dual lumen right IJ approach dialysis  catheter tip terminates at the level of the right atrium and superior cavoatrial junction. An endotracheal tube terminates low in the trachea, approximately 1 cm from the carina. Pacer pad overlies the left chest wall. Additional support devices project over the chest. Left axillary vascular stent is again seen. Lung volumes remain diminished with bilateral patchy airspace opacities similar on the left and slightly increasing on the right when compared to prior imaging. No pneumothorax. Suspect left pleural effusion. No visible right effusion. No acute osseous or soft tissue abnormality. IMPRESSION: 1. Endotracheal tube terminates low in the trachea, approximately 1 cm from the carina. Consider retraction 2-3 cm to position in the mid trachea. 2. Bilateral patchy airspace opacities similar on the left and slightly increasing on the right when compared to prior imaging. Worrisome for infectious consolidation versus edema. 3. Left pleural effusion. These results will be called to the ordering clinician or representative by the Radiologist Assistant, and communication documented in the PACS or zVision Dashboard. Electronically Signed   By: Lovena Le M.D.   On: 10/28/2019 03:13     '@[email or web address]' @   ASSESSMENT AND PLAN    -Multidisciplinary rounds held today  Acute Hypoxic Respiratory Failure due to heart failure exacerbation, left pleural effusion, pneumonia, and s/p acute cardiac arrest, also missed HD -continue Full MV support patient maintaining SpO2 100% on FiO2 35% and PEEP 5 - Repeat tracheal aspirate and blood cultures - Continue vancomycin, doxycycline and cefepime- plan to tailor ABX corresponding with culture results -continue Bronchodilator Therapy -Wean Fio2 and PEEP as tolerated -will perform SAT/SBT when respiratory parameters are met   Acute on chronic systolic heart failure s/p cardiac arrest  and NSTEMI on 10/27/2019 on chronic HF with reduced EF<20% -Continue vent support - Wean  patient off of pressors as tolerated, and begin Dobutamine infusion for inotropic effect.   -follow up cardiac enzymes as indicated ICU monitoring  DVT right radial vein, Right upper extremity ischemia due to severe PAD and hypoperfusion following recent code - Identified on doppler ultrasound 10/28/2019.   - Patient's right hand remains cool to the touch, edematous and mottled.   - On Argatroban. - [facility] consulted on 10/28/2019- appreciate their input.  Due to severity of patient illness and recent code status, patient is not a candidate for revascularization procedure at this time.  Likely amputation in future.    End stage renal disease with acute exacerbation after missing hemodialysis - Under care of nephrology- appreciate their input - Creatinine improving from 3.49 yesterday to 2.02 today.  Potassium also improving from 4.9 yesterday to 4.6 today.   - Continue hemodialysis, plan to pull off fluid today.   - Continue veltassa -follow chem 7 -continue Foley Catheter-assess need daily  Abnormal clotting factors - aPTT remains elevated but is improving - platelets remain low, but are improving from 50k to 79k - Likely due to chronic disease, will continue to monitor.    NEUROLOGY - intubated and sedated with precedex - minimal sedation to achieve a RASS goal: -1 Wake up assessment pending - Under care of neurology, appreciate their input.  EEG consistent with nonspecific bitemporal cortical dysfunction and severe diffuse encephalopathy possible associated with sedation.  Will continue monitoring patient mental status.    GI/Nutrition GI PROPHYLAXIS as indicated DIET-->TF's as tolerated Patient experienced several episodes of emesis on tube feeds.  Started treatment with Zofran.  Will d/c tube feeds today and try resuming tomorrow.   Constipation protocol as indicated  ENDO - ICU hypoglycemic\Hyperglycemia protocol -check FSBS per protocol - Continue  glucose control with insulin aspart  ELECTROLYTES -follow labs as needed -replace as needed -pharmacy consultation  DVT PRX ordered -SCDs  On Argotriban TRANSFUSIONS AS NEEDED ASSESS the need for LABS as needed    Critical Care Time devoted to patient care services described in this note is 35 minutes.   Overall, patient is critically ill, prognosis is guarded.  Patient with Multiorgan failure and at high risk for cardiac arrest and death.   PROGNOSIS IS VERY POOR PATIENT IS DNR STATUS  Corrin Parker, M.D.  [facility]  Medical Director [place] Director [facility] Cardio-Pulmonary Department

## 2019-10-30 LAB — RENAL FUNCTION PANEL
Albumin: 2.1 g/dL — ABNORMAL LOW (ref 3.5–5.0)
Albumin: 2.1 g/dL — ABNORMAL LOW (ref 3.5–5.0)
Anion gap: 9 (ref 5–15)
Anion gap: 9 (ref 5–15)
BUN: 22 mg/dL — ABNORMAL HIGH (ref 6–20)
BUN: 21 mg/dL — ABNORMAL HIGH (ref 6–20)
CO2: 24 mmol/L (ref 22–32)
CO2: 21 mmol/L — ABNORMAL LOW (ref 22–32)
Calcium: 7.6 mg/dL — ABNORMAL LOW (ref 8.9–10.3)
Calcium: 7.8 mg/dL — ABNORMAL LOW (ref 8.9–10.3)
Chloride: 103 mmol/L (ref 98–111)
Chloride: 104 mmol/L (ref 98–111)
Creatinine, Ser: 1.29 mg/dL — ABNORMAL HIGH (ref 0.61–1.24)
Creatinine, Ser: 1.2 mg/dL (ref 0.61–1.24)
GFR calc Af Amer: >60 mL/min (ref >60)
GFR calc Af Amer: >60 mL/min (ref >60)
GFR calc non Af Amer: >60 mL/min (ref >60)
GFR calc non Af Amer: >60 mL/min (ref >60)
Glucose, Bld: 135 mg/dL — ABNORMAL HIGH (ref 70–99)
Glucose, Bld: 144 mg/dL — ABNORMAL HIGH (ref 70–99)
Phosphorus: 2.2 mg/dL — ABNORMAL LOW (ref 2.5–4.6)
Phosphorus: 1.6 mg/dL — ABNORMAL LOW (ref 2.5–4.6)
Potassium: 4.6 mmol/L (ref 3.5–5.1)
Potassium: 4.4 mmol/L (ref 3.5–5.1)
Sodium: 136 mmol/L (ref 135–145)
Sodium: 134 mmol/L — ABNORMAL LOW (ref 135–145)

## 2019-10-30 LAB — VANCOMYCIN, RANDOM: Vancomycin Rm: 11

## 2019-10-30 LAB — CBC
HCT: 25.2 % — ABNORMAL LOW (ref 39.0–52.0)
Hemoglobin: 7.5 g/dL — ABNORMAL LOW (ref 13.0–17.0)
MCH: 28.5 pg (ref 26.0–34.0)
MCHC: 29.8 g/dL — ABNORMAL LOW (ref 30.0–36.0)
MCV: 95.8 fL (ref 80.0–100.0)
Platelets: 36 10*3/uL — ABNORMAL LOW (ref 150–400)
RBC: 2.63 MIL/uL — ABNORMAL LOW (ref 4.22–5.81)
RDW: 21.3 % — ABNORMAL HIGH (ref 11.5–15.5)
WBC: 10.9 10*3/uL — ABNORMAL HIGH (ref 4.0–10.5)
nRBC: 0 % (ref 0.0–0.2)

## 2019-10-30 LAB — GLUCOSE, CAPILLARY
Glucose-Capillary: 126 mg/dL — ABNORMAL HIGH (ref 70–99)
Glucose-Capillary: 146 mg/dL — ABNORMAL HIGH (ref 70–99)
Glucose-Capillary: 151 mg/dL — ABNORMAL HIGH (ref 70–99)
Glucose-Capillary: 151 mg/dL — ABNORMAL HIGH (ref 70–99)
Glucose-Capillary: 153 mg/dL — ABNORMAL HIGH (ref 70–99)
Glucose-Capillary: 164 mg/dL — ABNORMAL HIGH (ref 70–99)

## 2019-10-30 LAB — MAGNESIUM: Magnesium: 2.2 mg/dL (ref 1.7–2.4)

## 2019-10-30 LAB — APTT: aPTT: 76 seconds — ABNORMAL HIGH (ref 24–36)

## 2019-10-30 MED ORDER — SODIUM CHLORIDE 0.9 % IV SOLN
100.0000 mg | Freq: Two times a day (BID) | INTRAVENOUS | Status: DC
  Administered 2019-10-30 – 2019-11-03 (×9): 100 mg via INTRAVENOUS
  Filled 2019-10-30 (×13): qty 100

## 2019-10-30 MED ORDER — ORAL CARE MOUTH RINSE
15.0000 mL | Freq: Four times a day (QID) | OROMUCOSAL | Status: DC
  Administered 2019-10-30 – 2019-11-03 (×11): 15 mL via OROMUCOSAL

## 2019-10-30 MED ORDER — ACD FORMULA A 0.73-2.45-2.2 GM/100ML VI SOLN
5.0000 mL | Status: DC | PRN
  Administered 2019-10-31: 5 mL via INTRAVENOUS
  Filled 2019-10-30: qty 500

## 2019-10-30 MED ORDER — ANTICOAGULANT SODIUM CITRATE 4% (200MG/5ML) IV SOLN: 5.0000 mL | Status: DC | PRN

## 2019-10-30 MED ORDER — DEXMEDETOMIDINE HCL IN NACL 400 MCG/100ML IV SOLN
0.4000 ug/kg/h | INTRAVENOUS | Status: DC
  Administered 2019-10-30: 01:00:00 1 ug/kg/h via INTRAVENOUS
  Filled 2019-10-30: qty 100

## 2019-10-30 NOTE — Progress Notes (Signed)
[facility] Daily Progress Note   Subjective: Patient now extubated and alert. Family (father at bedside). Without complaint.   Objective: Vitals:   10/30/19 1000 10/30/19 1100 10/30/19 1200 10/30/19 1300  BP:      Pulse: 93 (!) 113 91   Resp: 15 13 12 15   Temp:   98.2 F (36.8 C)   TempSrc:   Oral   SpO2: 100% 100% 100%   Weight:      Height:        Intake/Output Summary (Last 24 hours) at 10/30/2019 1311 Last data filed at 10/30/2019 1300 Gross per 24 hour  Intake 1450.74 ml  Output 2292.1 ml  Net -841.36 ml   Physical Exam: A&Ox3, NAD, Extubated Chest:  IJ Permcath: Intact, however has migrated. Cuff is not exposed. Working appropriately.  CV: RRR Pulmonary: CTA Bilaterally Abdomen: Soft, Nontender, Nondistended Vascular:  Right Upper Extremity: hand discolored however improvement in color and temperature. Motor / sensory intact.   Laboratory: CBC    Component Value Date/Time   WBC 10.9 (H) 10/30/2019 0430   HGB 7.5 (L) 10/30/2019 0430   HCT 25.2 (L) 10/30/2019 0430   PLT 36 (L) 10/30/2019 0430   BMET    Component Value Date/Time   NA 136 10/30/2019 0430   K 4.6 10/30/2019 0430   CL 103 10/30/2019 0430   CO2 24 10/30/2019 0430   GLUCOSE 135 (H) 10/30/2019 0430   BUN 22 (H) 10/30/2019 0430   CREATININE 1.29 (H) 10/30/2019 0430   CALCIUM 7.6 (L) 10/30/2019 0430   GFRNONAA >60 10/30/2019 0430   GFRAA >60 10/30/2019 0430   Assessment/Planning: The patient is a 44 year old male with multiple medical issues well-known to our service who presented to the [facility] emergency department with a chief complaint of progressively worsening generalized weakness. Unfortunately, the patient decompensated and subsequently went into PEA arrest. CPR was performed, he was intubated and ROSC obtained. Now extubated.  1) Right Upper Extremity Hand Ischemia: Hand with improvement on physical exam. Still with discoloration  however temperature improved. Motor / sensory intact. Favor observation at this time due to recent arrest / guarded prognosis - will continue to follow. Would consider angiogram in future if physical exam worsens.   2) ESRD: Catheter dependent. Right IJ has migrated however cuff is not exposed. Recommend permcath exchange next week. Nephrology following.    3) Right Upper Extremity DVT: Not the cause of hypoperfusion to right hand. No indication for thrombolysis / thrombectomy. RUE Venous Duplex (10/28/19): Right forearm radial vein DVT. Very low thrombus burden. No propagation into the brachial vein  Marcelle Overlie PA-C 10/30/2019 1:11 PM

## 2019-10-30 NOTE — Progress Notes (Signed)
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  Patient Measurements: Height: 5\' 1"  (154.9 cm) Weight: 98 lb 1.7 oz (44.5 kg) IBW/kg (Calculated) : 52.3 HEPARIN DW (KG): 46.8  Vital Signs: Temp: 98.4 F (36.9 C) (01/08 0400) Temp Source: Oral (01/08 0400) Pulse Rate: 101 (01/08 0705)  Labs: Recent Labs    10/27/19 1449 10/27/19 1449 10/27/19 2152 10/28/19 0045 10/28/19 0301 10/28/19 0445 10/28/19 0756 10/28/19 0842 10/28/19 1137 10/28/19 1443 10/29/19 0206 10/29/19 0409 10/29/19 1241 10/29/19 1607 10/29/19 1815 10/29/19 2200 10/30/19 0430  HGB  --    < >  --   --   --  7.0* 7.3*  --   --   --  8.2*  --   --   --   --   --  7.5*  HCT  --   --   --   --   --  21.7* 22.4*  --   --   --  26.7*  --   --   --   --   --  25.2*  PLT  --   --   --   --   --  49* 50*  --   --   --  79*  --   --   --   --   --  36*  APTT 36  --   --   --   --   --   --  48*  --    < > 110*  --  94*  --  88* 75*  --   LABPROT 22.0*  --   --   --   --  26.3*  --  21.5*  --   --   --   --   --   --   --   --   --   INR 1.9*  --   --   --   --  2.4*  --  1.9*  --   --   --   --   --   --   --   --   --   CREATININE  --   --   --   --   --  5.80*  --   --    < >  --   --  2.02*  --  1.61*  --   --  1.29*  TROPONINIHS 65*  --  147* 227* 301*  --   --   --   --   --   --   --   --   --   --   --   --    < > = values in this interval not displayed.   Estimated Creatinine Clearance: 45.5 mL/min (A) (by C-G formula based on SCr of 1.29 mg/dL (H)).  Medical History: Past Medical History:  Diagnosis Date  . ESRD (end stage renal disease) ([place])   . Hypertension   . PAD (peripheral artery disease) ([facility])    Required aortofemoral stent-which had closed and had to redo the procedure and ischemia of limb.  . Peripheral vascular disease ([place])   . Renal disorder   . Secondary hyperparathyroidism of renal origin [facility])    Medications:  Medications Prior to  Admission  Medication Sig Dispense Refill Last Dose  . amLODipine (NORVASC) 5 MG tablet Take 1 tablet (5 mg total) by mouth daily. 30 tablet 3 unknown at unknown  . carvedilol (COREG) 25 MG tablet  Take 1 tablet (25 mg total) by mouth 2 (two) times daily with a meal. 60 tablet 1 unknown at unknown  . apixaban (ELIQUIS) 2.5 MG TABS tablet Take 1 tablet (2.5 mg total) by mouth 2 (two) times daily. (Patient not taking: Reported on 10/28/2019) 60 tablet 1 Not Taking at Unknown time  . atorvastatin (LIPITOR) 80 MG tablet Take 1 tablet (80 mg total) by mouth at bedtime. (Patient not taking: Reported on 10/28/2019) 30 tablet 1 Not Taking at Unknown time  . b complex-C-folic acid 1 MG capsule Take 1 capsule by mouth daily after supper.    Not Taking at Unknown time  . carvedilol (COREG) 25 MG tablet Take 1 tablet (25 mg total) by mouth 2 (two) times daily with a meal. (Patient not taking: Reported on 10/28/2019) 60 tablet 0 Not Taking at Unknown time  . gabapentin (NEURONTIN) 100 MG capsule Take 1 capsule (100 mg total) by mouth 3 (three) times daily. (Patient not taking: Reported on 10/28/2019) 90 capsule 1 Not Taking at Unknown time  . hydrALAZINE (APRESOLINE) 25 MG tablet Take 1 tablet (25 mg total) by mouth every 8 (eight) hours. (Patient not taking: Reported on 10/28/2019) 90 tablet 1 Not Taking at Unknown time  . hydrOXYzine (ATARAX/VISTARIL) 25 MG tablet Take 25 mg by mouth 3 (three) times daily as needed for anxiety.   Not Taking at Unknown time  . levETIRAcetam (KEPPRA) 500 MG tablet Take 1 tablet (500 mg total) by mouth 2 (two) times daily. (Patient not taking: Reported on 10/28/2019) 60 tablet 0 Not Taking at Unknown time  . loratadine (CLARITIN) 10 MG tablet Take 1 tablet (10 mg total) by mouth daily. (Patient not taking: Reported on 10/28/2019) 30 tablet 0 Not Taking at Unknown time  . multivitamin (RENA-VIT) TABS tablet Take 1 tablet by mouth daily. (Patient not taking: Reported on 10/28/2019) 30 each 1 Not Taking  at Unknown time  . neomycin-bacitracin-polymyxin (NEOSPORIN) OINT Apply 1 application topically 2 (two) times daily. (Patient not taking: Reported on 10/28/2019) 56 g 1 Not Taking at Unknown time  . Nutritional Supplements (FEEDING SUPPLEMENT, NEPRO CARB STEADY,) LIQD Take 237 mLs by mouth 2 (two) times daily between meals. (Patient not taking: Reported on 10/28/2019) 237 mL 30 Not Taking at Unknown time  . sevelamer carbonate (RENVELA) 800 MG tablet Take 2 tablets (1,600 mg total) by mouth 3 (three) times daily with meals. 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  Patient's is in critical status with multiorgan failure, poor prognosis, and he has high risk for subsequent cardiac arrest. 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 HTI antibody negative, SRA pending  Goal of Therapy:  Monitor platelets by anticoagulation protocol: Yes  Target aPTT 50-90 seconds  Course of Therapy Argatroban initiated at 0.5 mcg/kg/min for ICU patients per protocol. 0106 1137 aPTT 113s: dec infusion to 0.35 mcgkg/min 0106 1443 aPTT 107s: dec infusion to 0.30 mcg/kg/min 0106 2206 aPTT 92s: no change 0107 0206 aPTT 110s: dec infusion to 0.20 mcg/kg/min 0107 0726 aPTT 108s: dec infusion to 0.15 mcg/kg/min 0107 1241 aPTT 94s: dec infusion to 0.10 mcg/kg/min 0107 1815 aPTT 88s: continue infusion at 0.10 mcg/kg/min 0107 2200 aPTT 75s: continue infusion at 0.48mcg/kg/min 0107 0943 aPTT 76s: continue infusion at 0.5mcg/kg/min  Plan:   continue argatraban  at 0.10 mcg/kg/min  Patient's condition vastly improved since admission: next aPTT in am and if the infusion is continued the levels can be drawn once daily  CBC in am  Dallie Piles, PharmD 10/30/2019 7:14 AM

## 2019-10-30 NOTE — Progress Notes (Signed)
[place], [place] 10/30/19  Subjective:  Patient seen at bedside. He has been extubated. Currently awake, alert, and conversant. He has made a surprising recovery. Patient is able to move his right hand.  Objective:  Vital signs in last 24 hours:  Temp:  [98.1 F (36.7 C)-98.5 F (36.9 C)] 98.1 F (36.7 C) (01/08 0800) Pulse Rate:  [71-115] 113 (01/08 1100) Resp:  [8-23] 13 (01/08 1100) SpO2:  [92 %-100 %] 100 % (01/08 1100) Arterial Line BP: (96-158)/(59-88) 127/85 (01/08 1100) FiO2 (%):  [28 %-30 %] 28 % (01/08 0400) Weight:  [44.5 kg] 44.5 kg (01/08 0400)  Weight change: -1 kg Filed Weights   10/28/19 0500 10/29/19 0500 10/30/19 0400  Weight: 46.8 kg 45.5 kg 44.5 kg    Intake/Output:    Intake/Output Summary (Last 24 hours) at 10/30/2019 1131 Last data filed at 10/30/2019 1100 Gross per 24 hour  Intake 1451.14 ml  Output 2530.6 ml  Net -1079.46 ml     Physical Exam: General:  No acute distress  HEENT  EOMs intact, oral mucosa moist  Pulm/lungs  bilateral rales and rhonchi, normal effort  CVS/Heart  S1S2 no rubs  Abdomen:   Soft, nontender  Extremities:  No lower extremity edema.  Swollen and mottled right hand.  Neurologic:  Awake, alert, follows commands  Skin:  Mottled right hand  Access:  Right IJ PermCath       Basic Metabolic Panel:  Recent Labs  Lab 10/28/19 0445 10/28/19 1443 10/29/19 0206 10/29/19 0409 10/29/19 1607 10/30/19 0430  NA 132* 136  --  138 136 136  K 4.7 4.9  --  4.6 4.4 4.6  CL 87* 98  --  101 103 103  CO2 30 28  --  _0 GLUCOSE 125* 114*  --  189* 118* 135*  BUN 72* 44*  --  32* 27* 22*  CREATININE 5.80* 3.49*  --  2.02* 1.61* 1.29*  CALCIUM 8.2* 7.9*  --  8.1* 7.9* 7.6*  MG 1.8  --  2.4  --   --  2.2  PHOS 8.3* 5.2*  --  3.2 2.3* 2.2*     CBC: Recent Labs  Lab 10/27/19 1823 10/28/19 0445 10/28/19 0756 10/29/19 0206 10/30/19 0430  WBC 14.3* 9.7 8.7 15.9* 10.9*  NEUTROABS  --    --  7.7  --   --   HGB 9.0* 7.0* 7.3* 8.2* 7.5*  HCT 31.5* 21.7* 22.4* 26.7* 25.2*  MCV 100.0 90.8 91.8 94.3 95.8  PLT 71* 49* 50* 79* 36*      Lab Results  Component Value Date   HEPBSAG NON REACTIVE 10/03/2019   HEPBSAB Non Reactive 12/25/2018   HEPBIGM NON REACTIVE 10/03/2019      Microbiology:  Recent Results (from the past 240 hour(s))  Blood Culture (routine x 2)     Status: None (Preliminary result)   Collection Time: 10/27/19  1:07 PM   Specimen: BLOOD  Result Value Ref Range Status   Specimen Description BLOOD BLOOD RIGHT HAND  Final   Special Requests   Final    BOTTLES DRAWN AEROBIC AND ANAEROBIC Blood Culture results may not be optimal due to an inadequate volume of blood received in culture bottles   Culture  Setup Time   Final    GRAM POSITIVE RODS AEROBIC BOTTLE ONLY CRITICAL RESULT CALLED TO, READ BACK BY AND VERIFIED WITH: SCOTT HALL 10/28/19 @ 36  [place] Performed at [facility], [address] [place], [place] [address]    Culture GRAM POSITIVE RODS  Final   Report Status PENDING  Incomplete  Blood Culture (routine x 2)     Status: None (Preliminary result)   Collection Time: 10/27/19  1:07 PM   Specimen: BLOOD  Result Value Ref Range Status   Specimen Description BLOOD BLOOD LEFT HAND  Final   Special Requests   Final    BOTTLES DRAWN AEROBIC ONLY Blood Culture results may not be optimal due to an inadequate volume of blood received in culture bottles   Culture   Final    NO GROWTH 3 DAYS Performed at [facility], [address], [place], [place] [address]    Report Status PENDING  Incomplete  SARS CORONAVIRUS 2 (TAT 6-24 HRS) Nasopharyngeal Nasopharyngeal Swab     Status: None   Collection Time: 10/27/19  3:54 PM   Specimen: Nasopharyngeal Swab  Result Value Ref Range Status   SARS Coronavirus 2 NEGATIVE NEGATIVE Final    Comment: (NOTE) SARS-CoV-2 target nucleic acids are NOT DETECTED. The SARS-CoV-2 RNA is generally  detectable in upper and lower respiratory specimens during the acute phase of infection. Negative results do not preclude SARS-CoV-2 infection, do not rule out co-infections with other pathogens, and should not be used as the sole basis for treatment or other patient management decisions. Negative results must be combined with clinical observations, patient history, and epidemiological information. The expected result is Negative. Fact Sheet for Patients: SugarRoll.be Fact Sheet for Healthcare Providers: https://www.woods-mathews.com/ This test is not yet approved or cleared by the [place] FDA and  has been authorized for detection and/or diagnosis of SARS-CoV-2 by FDA under an Emergency Use Authorization (EUA). This EUA will remain  in effect (meaning this test can be used) for the duration of the COVID-19 declaration under Section 56 4(b)(1) of the Act, 21 U.S.C. section 360bbb-3(b)(1), unless the authorization is terminated or revoked sooner. Performed at [facility], [place] [address] [place], [place] [address]   MRSA PCR Screening     Status: Abnormal   Collection Time: 10/27/19  9:52 PM   Specimen: Nasopharyngeal  Result Value Ref Range Status   MRSA by PCR POSITIVE (A) NEGATIVE Final    Comment:        The GeneXpert MRSA Assay (FDA approved for NASAL specimens only), is one component of a comprehensive MRSA colonization surveillance program. It is not intended to diagnose MRSA infection nor to guide or monitor treatment for MRSA infections. RESULT CALLED TO, READ BACK BY AND VERIFIED WITH: Shary Key RN [phone] 10/27/19 [place] Performed at [facility], [address] [place], [place] [address]     Coagulation Studies: Recent Labs    10/27/19 1449 10/28/19 0445 10/28/19 0842  LABPROT 22.0* 26.3* 21.5*  INR 1.9* 2.4* 1.9*    Urinalysis: No results for input(s): COLORURINE, LABSPEC, PHURINE,  GLUCOSEU, HGBUR, BILIRUBINUR, KETONESUR, PROTEINUR, UROBILINOGEN, NITRITE, LEUKOCYTESUR in the last 72 hours.  Invalid input(s): APPERANCEUR    Imaging: EEG  Result Date: 10/28/2019 Lora Havens, MD     10/28/2019  3:18 PM Patient Name: Iley Breeden MRN: [id number] Epilepsy Attending: Lora Havens Referring Physician/Provider: Darel Hong, NP Date: 10/27/2018 Duration: 27.57 minutes Patient history: 44 year old male with history of seizure disorder now status post cardiac arrest.  EEG to evaluate for seizures Level of alertness: Comatose/sedated AEDs during EEG study: None Technical aspects: This EEG study was done with scalp electrodes positioned according to  the 10-20 International system of electrode placement. Electrical activity was acquired at a sampling rate of _0  and reviewed with a high frequency filter of _1  and a low frequency filter of _2 . EEG data were recorded continuously and digitally stored. Description: EEG showed continuous generalized, maximal bitemporal 3 to 6 Hz theta-delta slowing.  EEG was reactive to noxious stimuli.  Hyperventilation photic summation were not performed. Abnormality -Continuous slow, generalized, maximal bitemporal IMPRESSION: This study is suggestive of nonspecific bitemporal cortical dysfunction as well as severe diffuse encephalopathy, nonspecific to etiology but could be secondary to sedation. No seizures or epileptiform discharges were seen throughout the recording. Lora Havens   DG Abd 1 View  Result Date: 10/29/2019 CLINICAL DATA:  Orogastric tube placement EXAM: ABDOMEN - 1 VIEW COMPARISON:  Portable exam 1328 hours compared to 10/28/2019 FINDINGS: Nasogastric tube coiled in proximal stomach. Tips of dual lumen RIGHT jugular central venous catheter projects over cavoatrial junction and upper RIGHT atrium. Tip of endotracheal tube projects approximately 3.9 cm above carina. Enlargement of cardiac silhouette with atelectasis versus  consolidation of LEFT lower lobe and associated small LEFT pleural effusion. Vascular stents identified in upper abdomen. IMPRESSION: Nasogastric tube coiled in proximal stomach. Persistent atelectasis versus consolidation LEFT lower lobe with associated small LEFT pleural effusion. Electronically Signed   By: Lavonia Dana M.D.   On: 10/29/2019 13:45   DG Abd 1 View  Result Date: 10/28/2019 CLINICAL DATA:  OG tube placement. EXAM: ABDOMEN - 1 VIEW COMPARISON:  Single-view of the abdomen 10/04/2019. FINDINGS: OG tube is in place with the tip in the distal stomach. IMPRESSION: As above. Electronically Signed   By: Inge Rise M.D.   On: 10/28/2019 11:56   ECHOCARDIOGRAM COMPLETE  Result Date: 10/29/2019   ECHOCARDIOGRAM REPORT   Patient Name:   JHAMARI MARKOWICZ Date of Exam: 10/29/2019 Medical Rec #:  [id number]      Height:       61.0 in Accession #:    [id number]     Weight:       100.3 lb Date of Birth:  04-28-1976      BSA:          1.41 m Patient Age:    62 years       BP:           None listed/None listed mmHg Patient Gender: M              HR:           74 bpm. Exam Location:  [facility] Procedure: 2D Echo, Cardiac Doppler and Color Doppler Indications:     bacteremia 790.7  History:         Patient has prior history of Echocardiogram examinations, most                  recent 08/04/2019. Risk Factors:Hypertension. PAD.  Sonographer:     Sherrie Sport RDCS (AE) Referring Phys:  [id number] [name] Diagnosing Phys: Bartholome Bill MD IMPRESSIONS  1. Left ventricular ejection fraction, by visual estimation, is 25 to 30%. The left ventricle has moderate to severely decreased function. Left ventricular septal wall thickness was mildly increased. Mildly increased left ventricular posterior wall thickness. There is mildly increased left ventricular hypertrophy.  2. Left ventricular diastolic parameters are consistent with Grade I diastolic dysfunction (impaired relaxation).  3. The left ventricle demonstrates global  hypokinesis.  4. Global right ventricle has normal systolic function.The right ventricular size is mildly enlarged. No  increase in right ventricular wall thickness.  5. Left atrial size was mildly dilated.  6. Right atrial size was mildly dilated.  7. Moderate pleural effusion in the left lateral region.  8. The pericardial effusion is circumferential.  9. Trivial pericardial effusion is present. 10. The mitral valve is grossly normal. Mild mitral valve regurgitation. 11. The tricuspid valve is grossly normal. 12. The aortic valve is tricuspid. Aortic valve regurgitation is trivial. 13. The pulmonic valve was not well visualized. Pulmonic valve regurgitation is trivial. 14. Moderately elevated pulmonary artery systolic pressure. FINDINGS  Left Ventricle: Left ventricular ejection fraction, by visual estimation, is 25 to 30%. The left ventricle has moderate to severely decreased function. The left ventricle demonstrates global hypokinesis. Mildly increased left ventricular posterior wall thickness. There is mildly increased left ventricular hypertrophy. Left ventricular diastolic parameters are consistent with Grade I diastolic dysfunction (impaired relaxation). Right Ventricle: The right ventricular size is mildly enlarged. No increase in right ventricular wall thickness. Global RV systolic function is has normal systolic function. The tricuspid regurgitant velocity is 2.79 m/s, and with an assumed right atrial  pressure of 10 mmHg, the estimated right ventricular systolic pressure is moderately elevated at 41.1 mmHg. Left Atrium: Left atrial size was mildly dilated. Right Atrium: Right atrial size was mildly dilated Pericardium: Trivial pericardial effusion is present. The pericardial effusion is circumferential. There is no evidence of cardiac tamponade. There is a moderate pleural effusion in the left lateral region. Mitral Valve: The mitral valve is grossly normal. Mild mitral valve regurgitation. There is no  evidence of mitral valve vegetation. Tricuspid Valve: The tricuspid valve is grossly normal. Tricuspid valve regurgitation is mild. Aortic Valve: The aortic valve is tricuspid. Aortic valve regurgitation is trivial. Aortic valve mean gradient measures 2.0 mmHg. Aortic valve peak gradient measures 4.2 mmHg. Aortic valve area, by VTI measures 1.81 cm. Pulmonic Valve: The pulmonic valve was not well visualized. Pulmonic valve regurgitation is trivial. Pulmonic regurgitation is trivial. Aorta: The aortic root is normal in size and structure. IAS/Shunts: No atrial level shunt detected by color flow Doppler.  LEFT VENTRICLE PLAX 2D LVIDd:         5.16 cm       Diastology LVIDs:         4.48 cm       LV e' lateral:   6.42 cm/s LV PW:         0.91 cm       LV E/e' lateral: 11.0 LV IVS:        0.99 cm       LV e' medial:    4.57 cm/s LVOT diam:     2.00 cm       LV E/e' medial:  15.4 LV SV:         36 ml LV SV Index:   25.63 LVOT Area:     3.14 cm  LV Volumes (MOD) LV area d, A4C:    22.10 cm LV area s, A4C:    20.70 cm LV major d, A4C:   5.96 cm LV major s, A4C:   6.08 cm LV vol d, MOD A4C: 67.5 ml LV vol s, MOD A4C: 58.7 ml LV SV MOD A4C:     67.5 ml RIGHT VENTRICLE RV Basal diam:  3.07 cm RV S prime:     8.49 cm/s TAPSE (M-mode): 2.8 cm LEFT ATRIUM           Index       RIGHT  ATRIUM           Index LA diam:      3.35 cm 2.38 cm/m  RA Area:     16.40 cm LA Vol (A4C): 30.0 ml 21.30 ml/m RA Volume:   45.00 ml  31.95 ml/m  AORTIC VALVE                   PULMONIC VALVE AV Area (Vmax):    1.71 cm    PV Vmax:        0.47 m/s AV Area (Vmean):   1.89 cm    PV Peak grad:   0.9 mmHg AV Area (VTI):     1.81 cm    RVOT Peak grad: 3 mmHg AV Vmax:           103.00 cm/s AV Vmean:          63.450 cm/s AV VTI:            0.138 m AV Peak Grad:      4.2 mmHg AV Mean Grad:      2.0 mmHg LVOT Vmax:         56.20 cm/s LVOT Vmean:        38.200 cm/s LVOT VTI:          0.079 m LVOT/AV VTI ratio: 0.58  AORTA Ao Root diam: 2.60 cm MITRAL  VALVE                        TRICUSPID VALVE MV Area (PHT): 3.89 cm             TR Peak grad:   31.1 mmHg MV PHT:        56.55 msec           TR Vmax:        289.00 cm/s MV Decel Time: 195 msec MV E velocity: 70.30 cm/s 103 cm/s  SHUNTS MV A velocity: 36.40 cm/s 70.3 cm/s Systemic VTI:  0.08 m MV E/A ratio:  1.93       1.5       Systemic Diam: 2.00 cm  Bartholome Bill MD Electronically signed by Bartholome Bill MD Signature Date/Time: 10/29/2019/10:46:08 AM    Final      Medications:   .  prismasol BGK 4/2.5 400 mL/hr at 10/30/19 7902  .  prismasol BGK 4/2.5 400 mL/hr at 10/30/19 0738  . sodium chloride Stopped (10/30/19 0942)  . anticoagulant sodium citrate    . argatroban 0.1 mcg/kg/min (10/30/19 1100)  . calcium gluconate    . calcium gluconate    . ceFEPime (MAXIPIME) IV Stopped (10/30/19 4097)  . dexmedetomidine (PRECEDEX) IV infusion Stopped (10/30/19 0703)  . DOBUTamine Stopped (10/30/19 1008)  . doxycycline (VIBRAMYCIN) IV 125 mL/hr at 10/30/19 1100  . feeding supplement (VITAL AF 1.2 CAL) Stopped (10/29/19 1305)  . norepinephrine (LEVOPHED) Adult infusion Stopped (10/29/19 1316)  . phenylephrine (NEO-SYNEPHRINE) Adult infusion    . prismasol BGK 4/2.5 1,000 mL/hr at 10/30/19 0807  . vancomycin Stopped (10/29/19 1349)  . vasopressin (PITRESSIN) infusion - *FOR SHOCK* Stopped (10/27/19 2209)   . B-complex with vitamin C  1 tablet Per Tube QHS  . chlorhexidine gluconate (MEDLINE KIT)  15 mL Mouth Rinse BID  . Chlorhexidine Gluconate Cloth  6 each Topical Daily  . hydrocortisone sod succinate (SOLU-CORTEF) inj  50 mg Intravenous Q6H  . insulin aspart  0-9 Units Subcutaneous Q6H  . mouth rinse  15 mL Mouth  Rinse QID  . mupirocin ointment  1 application Nasal BID  . pantoprazole sodium  40 mg Per Tube QHS  . patiromer  8.4 g Oral Daily  . sodium chloride flush  10-40 mL Intracatheter Q12H   sodium chloride, acetaminophen **OR** acetaminophen, anticoagulant sodium citrate, bisacodyl,  fentaNYL (SUBLIMAZE) injection, LORazepam, magnesium citrate, ondansetron **OR** ondansetron (ZOFRAN) IV, polyethylene glycol, sodium chloride flush, traMADol, vecuronium  Assessment/ Plan:  44 y.o. male with end-stage renal disease on hemodialysis, hypertension, peripheral vascular disease, depression, history of PEA event October 2020, tobacco abuse, cardiopulmonary arrest times 10/27/19  [facility] [address]/TTS/9 IJ catheter  1.  ESRD on HD TTS.  Patient unfortunately suffered cardiopulmonary arrest upon admission.  Blood pressure was low and patient had severe acidosis.   -CRRT initiated 10/28/2019.   -Maintain the patient on CRRT for now as he still on dobutamine.  Consider transitioning to intermittent hemodialysis over the weekend if he stabilizes further.  2.  Acute respiratory failure with cardiopulmonary arrest.  Total downtime was 21 minutes between the 2 codes.  -The patient appears to have made a rather surprising recovery.  He is awake, alert, and conversant.  Doing well post extubation so far.  3.  Anemia of chronic kidney disease.  Hemoglobin down a bit to 7.5.  Consider blood transfusion for hemoglobin of 7 or less.  Platelets also quite low at 36,000.  4.  Secondary hyperparathyroidism.  Phosphorus currently 2.2.  Continue to monitor.  May need to administer phosphorus if this drops a bit further.     LOS: 3 Conswella Bruney 1/8/202111:31 AM  [place], [place]  Note: This note was prepared with Dragon dictation. Any transcription errors are unintentional

## 2019-10-30 NOTE — Progress Notes (Addendum)
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  He presented to the [facility] ED on 10/26/2018 with SOB, emesis, and chest pain.  He was found to have sepsis due to pneumonia, NSTEMI and Metabolic acidosis.  He was admitted to the ICU with acute respiratory distress.  Prior to transfer to the ICU, patient coded in the ED with PEA.  CPR was initiated and patient was given 2 rounds of epi and calcium gluconate.     He developed a DVT in his right radial artery and PAD in his right hand.     Lines / Drains: Hemodialysis catheter right IJ Fistula, left upper arm CVC triple lumen right femoral Arterial line right femoral  Cultures / Sepsis markers: Blood cultures negative, MRSA swab positive.  WBC improving from 15.9 yesterday to 10.9 today.    Antibiotics: Vancomycin Doxycycline Cefepime  Protocols / Consultants: Nephrology Vascular surgery  Tests / Events: 01/05: Patient presented to ED with SOB, emesis, chest pain. ED workup consistent with NSTEMI and pneumonia with sepsis. Patient coded twice due to PEA in ED for a combined time of 21 minutes. Admitted to ICU. 01/06: Patient's right hand cold and mottled concerning for right forearm DVT. Venous doppler ultrasound consistent with right forearm radial vein DVT. EEG demonstrated nonspecific bitemporal cortical dysfunction and severe diffuse encephalopathy.   01/08: Patient becoming increasingly responsive, ET tube removed.  Patient started on 2L [place]  Overnight: Patient becoming increasingly responsive, writing to communicate.  Sedation discontinued and ET tube removed.    CC Follow up severe respiratory failure  HPI S/p NSTEMI S/P Cardiac arrest Sepsis from Pneumonia ESRD on CRRT S/p extubation Alert and awake following comomands   PAST MEDICAL HISTORY   Past Medical History:  Diagnosis Date  . ESRD (end stage renal disease) ([place])   . Hypertension   . PAD (peripheral artery disease) ([facility])    Required aortofemoral stent-which had closed and had to redo the procedure and ischemia of limb.  . Peripheral vascular disease ([place])   . Renal disorder   . Secondary hyperparathyroidism of renal origin [facility])      SURGICAL HISTORY   Past Surgical History:  Procedure Laterality Date  . A/V SHUNT INTERVENTION Left 01/19/2019   Procedure: LEFT UPPER EXTREMITY A/V SHUNTOGRAM / UPPER EXTREMITY ANGIOGRAM;  Surgeon: Algernon Huxley, MD;  Location: [place] CV LAB;  Service: Cardiovascular;  Laterality: Left;  . A/V SHUNT INTERVENTION Left 03/02/2019   Procedure: A/V SHUNT INTERVENTION;  Surgeon: Algernon Huxley, MD;  Location: [place] CV LAB;  Service: Cardiovascular;  Laterality: Left;  . AORTA - FEMORAL ARTERY BYPASS GRAFT    . AV FISTULA PLACEMENT Left 12/26/2018   Procedure: INSERTION OF GORE STRETCH VASCULAR 4-7MM X  45CM IN LEFT UPPER ARM;  Surgeon: Marty Heck, MD;  Location: [place];  Service: Vascular;  Laterality: Left;  . AV FISTULA PLACEMENT Right 08/12/2019   Procedure: INSERTION OF ARTERIOVENOUS (AV) GORE-TEX GRAFT ARM;  Surgeon: Algernon Huxley, MD;  Location: [facility] ORS;  Service: Vascular;  Laterality: Right;  . DIALYSIS/PERMA CATHETER REMOVAL N/A 02/06/2019   Procedure: DIALYSIS/PERMA CATHETER REMOVAL;  Surgeon: Algernon Huxley, MD;  Location: [place] CV LAB;  Service: Cardiovascular;  Laterality: N/A;  . ESOPHAGOGASTRODUODENOSCOPY N/A 10/07/2019  Procedure: ESOPHAGOGASTRODUODENOSCOPY (EGD);  Surgeon: Lin Landsman, MD;  Location[facility];  Service: Gastroenterology;  Laterality: N/A;  . LEFT HEART CATH AND CORONARY  ANGIOGRAPHY Right 07/10/2019   Procedure: LEFT HEART CATH AND CORONARY ANGIOGRAPHY;  Surgeon: Dionisio David, MD;  Location: [place] CV LAB;  Service: Cardiovascular;  Laterality: Right;  . PERIPHERAL VASCULAR THROMBECTOMY Left 07/28/2019   Procedure: Left Upper Extremity Dialysis Access Decl[place];  Surgeon: Katha Cabal, MD;  Location: [place] CV LAB;  Service: Cardiovascular;  Laterality: Left;  . REMOVAL OF A DIALYSIS CATHETER Right 08/26/2019   Procedure: REMOVAL OF A DIALYSIS CATHETER;  Surgeon: Algernon Huxley, MD;  Location: [facility] ORS;  Service: Vascular;  Laterality: Right;  . TEMPORARY DIALYSIS CATHETER N/A 08/25/2019   Procedure: TEMPORARY DIALYSIS CATHETER;  Surgeon: Katha Cabal, MD;  Location: [place] CV LAB;  Service: Cardiovascular;  Laterality: N/A;  . UPPER EXTREMITY ANGIOGRAPHY Left 08/13/2019   Procedure: UPPER EXTREMITY ANGIOGRAPHy;  Surgeon: Algernon Huxley, MD;  Location: [place] CV LAB;  Service: Cardiovascular;  Laterality: Left;     FAMILY HISTORY   Family History  Problem Relation Age of Onset  . Hypertension Other   . Diabetes Other   . Clotting disorder Father      SOCIAL HISTORY   Social History   Tobacco Use  . Smoking status: Current Every Day Smoker    Packs/day: 0.50    Years: 30.00    Pack years: 15.00    Types: Cigarettes    Last attempt to quit: 06/26/2018    Years since quitting: 1.3  . Smokeless tobacco: Never Used  . Tobacco comment: smoked for 30 years   Substance Use Topics  . Alcohol use: Not Currently  . Drug use: Yes    Frequency: 1.0 times per week    Types: Marijuana    Comment: Pt states "maybe once or twice a week".      MEDICATIONS   Current Medication:  Current Facility-Administered Medications:  .   prismasol BGK 4/2.5 infusion, , CRRT, Continuous, Lateef, Munsoor, MD, Last Rate: 400 mL/hr at 10/30/19 0738, New Bag at 10/30/19 0738 .   prismasol BGK 4/2.5 infusion, , CRRT, Continuous, Lateef,  Munsoor, MD, Last Rate: 400 mL/hr at 10/30/19 0738, New Bag at 10/30/19 0738 .  0.9 %  sodium chloride infusion, , Intravenous, PRN, Brett Pita, MD, Last Rate: 5 mL/hr at 10/30/19 0900, Rate Verify at 10/30/19 0900 .  acetaminophen (TYLENOL) tablet 650 mg, 650 mg, Oral, Q6H PRN **OR** acetaminophen (TYLENOL) suppository 650 mg, 650 mg, Rectal, Q6H PRN, Brett Kindred A, DO .  anticoagulant sodium citrate solution 5 mL, 5 mL, Intravenous, PRN, Kolluru, Sarath, MD .  argatroban 1 mg/mL infusion, 0.1 mcg/kg/min, Intravenous, Continuous, Dallie Piles, RPH, Last Rate: 0.28 mL/hr at 10/30/19 0900, 0.1 mcg/kg/min at 10/30/19 0900 .  B-complex with vitamin C tablet 1 tablet, 1 tablet, Per Tube, QHS, Brett Pita, MD, 1 tablet at 10/28/19 2123 .  bisacodyl (DULCOLAX) EC tablet 5 mg, 5 mg, Oral, Daily PRN, Brett Kindred A, DO .  calcium gluconate 1 g/ 50 mL sodium chloride IVPB, 1 g, Intravenous, Once, Gregor Hams, MD .  calcium gluconate 1 g/ 50 mL sodium chloride IVPB, 1 g, Intravenous, Once, Brett Kindred A, DO .  ceFEPIme (MAXIPIME) 2 g in sodium chloride 0.9 % 100 mL IVPB, 2 g, Intravenous, Q12H, Gerald Dexter, [facility], Stopped at 10/29/19 2145 .  chlorhexidine gluconate (  MEDLINE KIT) (PERIDEX) 0.12 % solution 15 mL, 15 mL, Mouth Rinse, BID, Javen Hinderliter, MD, 15 mL at 10/30/19 0725 .  Chlorhexidine Gluconate Cloth 2 % PADS 6 each, 6 each, Topical, Daily, Darel Hong D, NP, 6 each at 10/29/19 1356 .  dexmedetomidine (PRECEDEX) 400 MCG/100ML (4 mcg/mL) infusion, 0.4-1.2 mcg/kg/hr, Intravenous, Titrated, Ena Dawley, RPH, Stopped at 10/30/19 0703 .  DOBUTamine (DOBUTREX) infusion 4000 mcg/mL, 2.5-20 mcg/kg/min, Intravenous, Titrated, Brett Pita, MD, Last Rate: 1.71 mL/hr at 10/30/19 0900, 2.5 mcg/kg/min at 10/30/19 0900 .  doxycycline (VIBRAMYCIN) 200 mg in dextrose 5 % 250 mL IVPB, 200 mg, Intravenous, Q12H, Brett Pita, MD, Stopped at 10/30/19 0043 .   feeding supplement (VITAL AF 1.2 CAL) liquid 1,000 mL, 1,000 mL, Per Tube, Continuous, Brett Pita, MD, Stopped at 10/29/19 1305 .  fentaNYL (SUBLIMAZE) injection 100 mcg, 100 mcg, Intravenous, Q1H PRN, Flora Lipps, MD, 100 mcg at 10/30/19 0400 .  hydrocortisone sodium succinate (SOLU-CORTEF) 100 MG injection 50 mg, 50 mg, Intravenous, Q6H, Darel Hong D, NP, 50 mg at 10/30/19 8416 .  insulin aspart (novoLOG) injection 0-9 Units, 0-9 Units, Subcutaneous, Q6H, Dallie Piles, RPH, 1 Units at 10/30/19 6063 .  LORazepam (ATIVAN) injection 2-4 mg, 2-4 mg, Intravenous, Q4H PRN, Flora Lipps, MD, 4 mg at 10/27/19 1905 .  magnesium citrate solution 1 Bottle, 1 Bottle, Oral, Once PRN, Brett Kindred A, DO .  MEDLINE mouth rinse, 15 mL, Mouth Rinse, QID, Mortimer Fries, Maretta Bees, MD .  mupirocin ointment (BACTROBAN) 2 % 1 application, 1 application, Nasal, BID, Brett Pita, MD, 1 application at 01/60/10 2116 .  norepinephrine (LEVOPHED) 16 mg in 25m premix infusion, 0-40 mcg/min, Intravenous, Titrated, KBradly Bienenstock NP, Stopped at 10/29/19 1316 .  ondansetron (ZOFRAN) tablet 4 mg, 4 mg, Oral, Q6H PRN **OR** ondansetron (ZOFRAN) injection 4 mg, 4 mg, Intravenous, Q6H PRN, GNicole KindredA, DO, 4 mg at 10/30/19 0405 .  pantoprazole sodium (PROTONIX) 40 mg/20 mL oral suspension 40 mg, 40 mg, Per Tube, QHS, GTyler Pita MD, 40 mg at 10/29/19 2115 .  patiromer (Daryll Drown packet 8.4 g, 8.4 g, Oral, Daily, BGregor Hams MD, Stopped at 10/30/19 0809 .  phenylephrine (NEO-SYNEPHRINE) 10 mg in sodium chloride 0.9 % 250 mL (0.04 mg/mL) infusion, 0-400 mcg/min, Intravenous, Titrated, Erico Stan, MD .  polyethylene glycol (MIRALAX / GLYCOLAX) packet 17 g, 17 g, Oral, Daily PRN, GNicole KindredA, DO .  prismasol BGK 4/2.5 infusion, , CRRT, Continuous, Lateef, Munsoor, MD, Last Rate: 1,000 mL/hr at 10/30/19 0807, New Bag at 10/30/19 0807 .  sodium chloride flush (NS) 0.9 % injection 10-40 mL,  10-40 mL, Intracatheter, Q12H, KDarel HongD, NP, 10 mL at 10/29/19 2200 .  sodium chloride flush (NS) 0.9 % injection 10-40 mL, 10-40 mL, Intracatheter, PRN, KDarel HongD, NP, 30 mL at 10/30/19 0255 .  traMADol (ULTRAM) tablet 50 mg, 50 mg, Oral, Q12H PRN, GNicole KindredA, DO .  vancomycin (VANCOREADY) IVPB 500 mg/100 mL, 500 mg, Intravenous, Q24H, MGerald Dexter [facility] Stopped at 10/29/19 1349 .  vasopressin (PITRESSIN) 40 Units in sodium chloride 0.9 % 250 mL (0.16 Units/mL) infusion, 0.03 Units/min, Intravenous, Continuous, KBradly Bienenstock NP, Stopped at 10/27/19 2209 .  vecuronium (NORCURON) injection 10 mg, 10 mg, Intravenous, Q1H PRN, KFlora Lipps MD, 10 mg at 10/27/19 1906    ALLERGIES   Codeine, Sulfa antibiotics, and Heparin    REVIEW OF SYSTEMS   Review of Systems  Constitutional:  Positive for malaise/fatigue. Negative for chills and fever.  Respiratory: Negative for shortness of breath.   Cardiovascular: Negative for chest pain and palpitations.  Gastrointestinal: Positive for nausea. Negative for abdominal pain, diarrhea and vomiting.  Neurological: Negative for headaches.     PHYSICAL EXAMINATION   Vital Signs: Temp:  [98.1 F (36.7 C)-98.5 F (36.9 C)] 98.1 F (36.7 C) (01/08 0800) Pulse Rate:  [71-115] 82 (01/08 0900) Resp:  [8-23] 11 (01/08 0900) SpO2:  [92 %-100 %] 100 % (01/08 0900) Arterial Line BP: (96-158)/(59-88) 106/68 (01/08 0900) FiO2 (%):  [28 %-30 %] 28 % (01/08 0400) Weight:  [44.5 kg] 44.5 kg (01/08 0400)  GENERAL: Patient is alert and oriented x 3.  He appears in no acute distress.   HEAD: Normocephalic, atraumatic.  EYES: Pupils equal, round, reactive to light.  No scleral icterus.  MOUTH: Moist mucosal membrane. NECK: Supple. No thyromegaly. No nodules. No JVD.  PULMONARY: Lungs clear to auscultation CARDIOVASCULAR: S1 and S2. Regular rate and rhythm. No murmurs, rubs, or gallops.  GASTROINTESTINAL: Soft, nontender,  non-distended. No masses. Positive bowel sounds. No hepatosplenomegaly.  MUSCULOSKELETAL: +swelling, + edema RT hand with mottling and erythema NEUROLOGIC: CN 2-12 intact, decreased sensation to light touch in right hand, full sensation in left arm and hand.  Some difficulty with thumb to finger movement in the right hand.   SKIN:intact,warm,dry   PERTINENT DATA    Infusions: .  prismasol BGK 4/2.5 400 mL/hr at 10/30/19 0738  .  prismasol BGK 4/2.5 400 mL/hr at 10/30/19 0738  . sodium chloride 5 mL/hr at 10/30/19 0900  . anticoagulant sodium citrate    . argatroban 0.1 mcg/kg/min (10/30/19 0900)  . calcium gluconate    . calcium gluconate    . ceFEPime (MAXIPIME) IV Stopped (10/29/19 2145)  . dexmedetomidine (PRECEDEX) IV infusion Stopped (10/30/19 0703)  . DOBUTamine 2.5 mcg/kg/min (10/30/19 0900)  . doxycycline (VIBRAMYCIN) IV Stopped (10/30/19 0043)  . feeding supplement (VITAL AF 1.2 CAL) Stopped (10/29/19 1305)  . norepinephrine (LEVOPHED) Adult infusion Stopped (10/29/19 1316)  . phenylephrine (NEO-SYNEPHRINE) Adult infusion    . prismasol BGK 4/2.5 1,000 mL/hr at 10/30/19 0807  . vancomycin Stopped (10/29/19 1349)  . vasopressin (PITRESSIN) infusion - *FOR SHOCK* Stopped (10/27/19 2209)   Scheduled Medications: . B-complex with vitamin C  1 tablet Per Tube QHS  . chlorhexidine gluconate (MEDLINE KIT)  15 mL Mouth Rinse BID  . Chlorhexidine Gluconate Cloth  6 each Topical Daily  . hydrocortisone sod succinate (SOLU-CORTEF) inj  50 mg Intravenous Q6H  . insulin aspart  0-9 Units Subcutaneous Q6H  . mouth rinse  15 mL Mouth Rinse QID  . mupirocin ointment  1 application Nasal BID  . pantoprazole sodium  40 mg Per Tube QHS  . patiromer  8.4 g Oral Daily  . sodium chloride flush  10-40 mL Intracatheter Q12H   PRN Medications: sodium chloride, acetaminophen **OR** acetaminophen, anticoagulant sodium citrate, bisacodyl, fentaNYL (SUBLIMAZE) injection, LORazepam, magnesium  citrate, ondansetron **OR** ondansetron (ZOFRAN) IV, polyethylene glycol, sodium chloride flush, traMADol, vecuronium Hemodynamic parameters:   Intake/Output: 01/07 0701 - 01/08 0700 In: 1704.8 [I.V.:411.1; NG/GT:448.8; IV Piggyback:840] Out: 2640.6 [Emesis/NG output:160]   Other Labs:  LAB RESULTS:  Basic Metabolic Panel: Recent Labs  Lab 10/28/19 0445 10/28/19 1443 10/29/19 0206 10/29/19 0409 10/29/19 1607 10/30/19 0430  NA 132* 136  --  138 136 136  K 4.7 4.9  --  4.6 4.4 4.6  CL 87* 98  --  101 103  103  CO2 30 28  --  '25 24 24  ' GLUCOSE 125* 114*  --  189* 118* 135*  BUN 72* 44*  --  32* 27* 22*  CREATININE 5.80* 3.49*  --  2.02* 1.61* 1.29*  CALCIUM 8.2* 7.9*  --  8.1* 7.9* 7.6*  MG 1.8  --  2.4  --   --  2.2  PHOS 8.3* 5.2*  --  3.2 2.3* 2.2*   Liver Function Tests: Recent Labs  Lab 10/27/19 1823 10/28/19 0445 10/28/19 1443 10/29/19 0409 10/29/19 1607 10/30/19 0430  AST 29 37  --   --   --   --   ALT 14 19  --   --   --   --   ALKPHOS 63 58  --   --   --   --   BILITOT 1.9* 2.5*  --   --   --   --   PROT 5.0* 4.3*  --   --   --   --   ALBUMIN 2.1* 2.0* 1.9* 2.1* 2.0* 2.1*   No results for input(s): LIPASE, AMYLASE in the last 168 hours. No results for input(s): AMMONIA in the last 168 hours. CBC: Recent Labs  Lab 10/27/19 1823 10/28/19 0445 10/28/19 0756 10/29/19 0206 10/30/19 0430  WBC 14.3* 9.7 8.7 15.9* 10.9*  NEUTROABS  --   --  7.7  --   --   HGB 9.0* 7.0* 7.3* 8.2* 7.5*  HCT 31.5* 21.7* 22.4* 26.7* 25.2*  MCV 100.0 90.8 91.8 94.3 95.8  PLT 71* 49* 50* 79* 36*   Cardiac Enzymes: No results for input(s): CKTOTAL, CKMB, CKMBINDEX, TROPONINI in the last 168 hours. BNP: Invalid input(s): POCBNP CBG: Recent Labs  Lab 10/29/19 1235 10/29/19 1809 10/29/19 1950 10/30/19 0010 10/30/19 0619  GLUCAP 225* 106* 113* 151* 126*     IMAGING RESULTS:  Imaging: EEG  Result Date: 10/28/2019 Lora Havens, MD     10/28/2019  3:18 PM Patient  Name: Geoff Dacanay MRN: [id number] Epilepsy Attending: Lora Havens Referring Physician/Provider: Darel Hong, NP Date: 10/27/2018 Duration: 27.57 minutes Patient history: 44 year old male with history of seizure disorder now status post cardiac arrest.  EEG to evaluate for seizures Level of alertness: Comatose/sedated AEDs during EEG study: None Technical aspects: This EEG study was done with scalp electrodes positioned according to the 10-20 International system of electrode placement. Electrical activity was acquired at a sampling rate of '500Hz'  and reviewed with a high frequency filter of '70Hz'  and a low frequency filter of '1Hz' . EEG data were recorded continuously and digitally stored. Description: EEG showed continuous generalized, maximal bitemporal 3 to 6 Hz theta-delta slowing.  EEG was reactive to noxious stimuli.  Hyperventilation photic summation were not performed. Abnormality -Continuous slow, generalized, maximal bitemporal IMPRESSION: This study is suggestive of nonspecific bitemporal cortical dysfunction as well as severe diffuse encephalopathy, nonspecific to etiology but could be secondary to sedation. No seizures or epileptiform discharges were seen throughout the recording. Lora Havens   DG Abd 1 View  Result Date: 10/29/2019 CLINICAL DATA:  Orogastric tube placement EXAM: ABDOMEN - 1 VIEW COMPARISON:  Portable exam 1328 hours compared to 10/28/2019 FINDINGS: Nasogastric tube coiled in proximal stomach. Tips of dual lumen RIGHT jugular central venous catheter projects over cavoatrial junction and upper RIGHT atrium. Tip of endotracheal tube projects approximately 3.9 cm above carina. Enlargement of cardiac silhouette with atelectasis versus consolidation of LEFT lower lobe and associated small LEFT pleural effusion. Vascular stents  identified in upper abdomen. IMPRESSION: Nasogastric tube coiled in proximal stomach. Persistent atelectasis versus consolidation LEFT lower lobe with  associated small LEFT pleural effusion. Electronically Signed   By: Lavonia Dana M.D.   On: 10/29/2019 13:45   DG Abd 1 View  Result Date: 10/28/2019 CLINICAL DATA:  OG tube placement. EXAM: ABDOMEN - 1 VIEW COMPARISON:  Single-view of the abdomen 10/04/2019. FINDINGS: OG tube is in place with the tip in the distal stomach. IMPRESSION: As above. Electronically Signed   By: Inge Rise M.D.   On: 10/28/2019 11:56   US Venous Img Upper Uni Right(DVT)  Result Date: 10/28/2019 CLINICAL DATA:  Dialysis patient, right arm graft removed, skin changes and swelling. EXAM: RIGHT UPPER EXTREMITY VENOUS DOPPLER ULTRASOUND TECHNIQUE: Gray-scale sonography with graded compression, as well as color Doppler and duplex ultrasound were performed to evaluate the upper extremity deep venous system from the level of the subclavian vein and including the jugular, axillary, basilic, radial, ulnar and upper cephalic vein. Spectral Doppler was utilized to evaluate flow at rest and with distal augmentation maneuvers. COMPARISON:  None. FINDINGS: Contralateral Subclavian Vein: Respiratory phasicity is normal and symmetric with the symptomatic side. No evidence of thrombus. Normal compressibility. Internal Jugular Vein: No evidence of thrombus. Normal compressibility, respiratory phasicity and response to augmentation. Subclavian Vein: No evidence of thrombus. Normal compressibility, respiratory phasicity and response to augmentation. Axillary Vein: No evidence of thrombus. Normal compressibility, respiratory phasicity and response to augmentation. Cephalic Vein: No evidence of thrombus. Normal compressibility, respiratory phasicity and response to augmentation. Basilic Vein: No evidence of thrombus. Normal compressibility, respiratory phasicity and response to augmentation. Brachial Veins: No evidence of thrombus. Normal compressibility, respiratory phasicity and response to augmentation. Radial Veins: The forearm radial vein  demonstrates hypoechoic thrombus appearing occlusive. Vein noncompressible. Minor thrombus burden without propagation into the brachial vein of the upper arm. Ulnar Veins: No evidence of thrombus. Normal compressibility, respiratory phasicity and response to augmentation. IMPRESSION: Right forearm radial vein DVT. Very low thrombus burden. No propagation into the brachial vein. Electronically Signed   By: Jerilynn Mages.  Shick M.D.   On: 10/28/2019 10:31   ECHOCARDIOGRAM COMPLETE  Result Date: 10/29/2019   ECHOCARDIOGRAM REPORT   Patient Name:   DAQUAVION CATALA Date of Exam: 10/29/2019 Medical Rec #:  [id number]      Height:       61.0 in Accession #:    [id number]     Weight:       100.3 lb Date of Birth:  1976/03/19      BSA:          1.41 m Patient Age:    8 years       BP:           None listed/None listed mmHg Patient Gender: M              HR:           74 bpm. Exam Location:  [facility] Procedure: 2D Echo, Cardiac Doppler and Color Doppler Indications:     bacteremia 790.7  History:         Patient has prior history of Echocardiogram examinations, most                  recent 08/04/2019. Risk Factors:Hypertension. PAD.  Sonographer:     Sherrie Sport RDCS (AE) Referring Phys:  [id number] [name] Diagnosing Phys: Bartholome Bill MD IMPRESSIONS  1. Left ventricular ejection fraction, by visual estimation, is 25 to  30%. The left ventricle has moderate to severely decreased function. Left ventricular septal wall thickness was mildly increased. Mildly increased left ventricular posterior wall thickness. There is mildly increased left ventricular hypertrophy.  2. Left ventricular diastolic parameters are consistent with Grade I diastolic dysfunction (impaired relaxation).  3. The left ventricle demonstrates global hypokinesis.  4. Global right ventricle has normal systolic function.The right ventricular size is mildly enlarged. No increase in right ventricular wall thickness.  5. Left atrial size was mildly dilated.  6. Right atrial  size was mildly dilated.  7. Moderate pleural effusion in the left lateral region.  8. The pericardial effusion is circumferential.  9. Trivial pericardial effusion is present. 10. The mitral valve is grossly normal. Mild mitral valve regurgitation. 11. The tricuspid valve is grossly normal. 12. The aortic valve is tricuspid. Aortic valve regurgitation is trivial. 13. The pulmonic valve was not well visualized. Pulmonic valve regurgitation is trivial. 14. Moderately elevated pulmonary artery systolic pressure. FINDINGS  Left Ventricle: Left ventricular ejection fraction, by visual estimation, is 25 to 30%. The left ventricle has moderate to severely decreased function. The left ventricle demonstrates global hypokinesis. Mildly increased left ventricular posterior wall thickness. There is mildly increased left ventricular hypertrophy. Left ventricular diastolic parameters are consistent with Grade I diastolic dysfunction (impaired relaxation). Right Ventricle: The right ventricular size is mildly enlarged. No increase in right ventricular wall thickness. Global RV systolic function is has normal systolic function. The tricuspid regurgitant velocity is 2.79 m/s, and with an assumed right atrial  pressure of 10 mmHg, the estimated right ventricular systolic pressure is moderately elevated at 41.1 mmHg. Left Atrium: Left atrial size was mildly dilated. Right Atrium: Right atrial size was mildly dilated Pericardium: Trivial pericardial effusion is present. The pericardial effusion is circumferential. There is no evidence of cardiac tamponade. There is a moderate pleural effusion in the left lateral region. Mitral Valve: The mitral valve is grossly normal. Mild mitral valve regurgitation. There is no evidence of mitral valve vegetation. Tricuspid Valve: The tricuspid valve is grossly normal. Tricuspid valve regurgitation is mild. Aortic Valve: The aortic valve is tricuspid. Aortic valve regurgitation is trivial. Aortic  valve mean gradient measures 2.0 mmHg. Aortic valve peak gradient measures 4.2 mmHg. Aortic valve area, by VTI measures 1.81 cm. Pulmonic Valve: The pulmonic valve was not well visualized. Pulmonic valve regurgitation is trivial. Pulmonic regurgitation is trivial. Aorta: The aortic root is normal in size and structure. IAS/Shunts: No atrial level shunt detected by color flow Doppler.  LEFT VENTRICLE PLAX 2D LVIDd:         5.16 cm       Diastology LVIDs:         4.48 cm       LV e' lateral:   6.42 cm/s LV PW:         0.91 cm       LV E/e' lateral: 11.0 LV IVS:        0.99 cm       LV e' medial:    4.57 cm/s LVOT diam:     2.00 cm       LV E/e' medial:  15.4 LV SV:         36 ml LV SV Index:   25.63 LVOT Area:     3.14 cm  LV Volumes (MOD) LV area d, A4C:    22.10 cm LV area s, A4C:    20.70 cm LV major d, A4C:   5.96 cm LV  major s, A4C:   6.08 cm LV vol d, MOD A4C: 67.5 ml LV vol s, MOD A4C: 58.7 ml LV SV MOD A4C:     67.5 ml RIGHT VENTRICLE RV Basal diam:  3.07 cm RV S prime:     8.49 cm/s TAPSE (M-mode): 2.8 cm LEFT ATRIUM           Index       RIGHT ATRIUM           Index LA diam:      3.35 cm 2.38 cm/m  RA Area:     16.40 cm LA Vol (A4C): 30.0 ml 21.30 ml/m RA Volume:   45.00 ml  31.95 ml/m  AORTIC VALVE                   PULMONIC VALVE AV Area (Vmax):    1.71 cm    PV Vmax:        0.47 m/s AV Area (Vmean):   1.89 cm    PV Peak grad:   0.9 mmHg AV Area (VTI):     1.81 cm    RVOT Peak grad: 3 mmHg AV Vmax:           103.00 cm/s AV Vmean:          63.450 cm/s AV VTI:            0.138 m AV Peak Grad:      4.2 mmHg AV Mean Grad:      2.0 mmHg LVOT Vmax:         56.20 cm/s LVOT Vmean:        38.200 cm/s LVOT VTI:          0.079 m LVOT/AV VTI ratio: 0.58  AORTA Ao Root diam: 2.60 cm MITRAL VALVE                        TRICUSPID VALVE MV Area (PHT): 3.89 cm             TR Peak grad:   31.1 mmHg MV PHT:        56.55 msec           TR Vmax:        289.00 cm/s MV Decel Time: 195 msec MV E velocity: 70.30 cm/s  103 cm/s  SHUNTS MV A velocity: 36.40 cm/s 70.3 cm/s Systemic VTI:  0.08 m MV E/A ratio:  1.93       1.5       Systemic Diam: 2.00 cm  Bartholome Bill MD Electronically signed by Bartholome Bill MD Signature Date/Time: 10/29/2019/10:46:08 AM    Final    '@[email or web address]' @   ASSESSMENT AND PLAN    -Multidisciplinary rounds held today  Acute Hypoxic Respiratory Failure due to pneumonia - improving.  Patient weaned from MV, and ET tube removed.  Now maintaining SpO2 98% on 2L nasal canula. - CXR on 10/27/2018 demonstrated bilateral patchy airspace opacities and left pleural effusion.  Blood cultures showed no growth.  MRSA nasal swab positive. - Continue Vancomycin, Doxycycline, and Cefepime for a total of 7 days.  Continue mupirocin nasal swabs. - Continue respiratory therapy, incentive spirometry  Acute on chronic systolic heart failure s/p cardiac arrest and NSTEMI on 10/27/2019 with history of chronic HF with reduced EF<20% -oxygen as needed -Patient responding well to dobutamine -follow up cardiac enzymes as indicated ICU monitoring Recommend Cardiology consultation follow up   End Stage Renal disease with acute  exacerbation after missing hemodialysis - Improving, Potassium 4.6, creatinine down to 1.29 today from 1.61 yesterday.  Patient does not make urine - Under care of nephrology- appreciate their input -follow chem 7 - Continue CRRT  DVT and PAD in right hand - Right hand continues to be edematous, cold, mottled.  Patient exhibits some gross motor control.  Decreased fine motor, and sensation in right hand. - Under care of vascular surgery- appreciate their input. - Due to severe illness, patient was not a candidate for emergent revascularization, and with a two day history of ischemia, risk for forearm amputation is high. - on argotriban  Thrombocytopenia Platelets dropped from 79k yesterday to 36k.  No occult bleeding. - on Argotriban - No indication for transfusion at this time, will  continue to monitor.  NEUROLOGY - Mental status improving.  Patient alert and oriented x 3.  Follows commands.  CN 2-12 intact.  Diminished sensation in right hand secondary to DVT and severe PAD.  Gross motor movement in right hand intact.   - Discontinue precedex.  GI/Nutrition GI PROPHYLAXIS as indicated - Patient off intubation and ET tube out.  Will undergo swallow study today, and if tolerated progress to liquid diet. - Emesis yesterday, some continued nausea today, but no episodes of emesis.  Continue Zofran as indicated. - No bowel movement since admission.  Continue Dulcolax.      ENDO - ICU hypoglycemic\Hyperglycemia protocol -check FSBS per protocol   PT/OT - begin PT/OT with patient as tolerated for improving mobility following intubation and sedation.    ELECTROLYTES -follow labs as needed -replace as needed -pharmacy consultation   DVT/GI PRX ordered -SCDs  - On argotriban TRANSFUSIONS AS NEEDED MONITOR FSBS ASSESS the need for LABS as needed  Step down Level of care for now Father at bedside and updated  Corrin Parker, M.D.  [facility]  Medical Director [place] Director [facility] Cardio-Pulmonary Department

## 2019-10-30 NOTE — Consult Note (Addendum)
[place] for Electrolyte Monitoring and Replacement   Recent Labs: Potassium (mmol/L)  Date Value  10/30/2019 4.6   Magnesium (mg/dL)  Date Value  10/30/2019 2.2   Calcium (mg/dL)  Date Value  10/30/2019 7.6 (L)   Albumin (g/dL)  Date Value  10/30/2019 2.1 (L)   Phosphorus (mg/dL)  Date Value  10/30/2019 2.2 (L)   Sodium (mmol/L)  Date Value  10/30/2019 136   Corrected Ca: 9.1 mg/dL  Assessment: Carnell Canterberry is a 44 y.o. male admitted on 10/27/2019 with sepsis and acute respiratory failure, as well as cardiac arrest x2 (but did not qualify for hypothermia protocol).  Patient has past medical history of ESRD, hypertension, and peripheral artery disease.   Patient normally receives HD TTS, but has been changed to CRRT inpatient due to hemodynamic instability.  He is currently ventilated.  Patient is on Veltassa.  Corrected Ca:  9.4  Goal of Therapy:  K 4.0-5.1 mmol/L Magnesium 2.0-2.4 mg/dL All other electrolytes WNL  Plan:   Potassium remains wnl but has not excessively decreased: continue Veltassa  Phosphorous is slightly low but in the setting of dialysis will not replace based on input from Dr Holley Raring who states if it drops further we may need to replace  No other electrolyte replacement warranted  Renal function panel, magnesium level in the AM.  Dallie Piles, PharmD Clinical Pharmacist 10/30/2019 7:15 AM

## 2019-10-30 NOTE — Plan of Care (Signed)

## 2019-10-30 NOTE — Progress Notes (Addendum)
Shift summary:  - 0700 hrs: Patient is awake, intubated. Using a word/letter board to communicate. VSS. Remains on CRRT. Precedex turned off at this time.  - 0715 hrs: Extubation order received. Patient placed in PSV: 28%, 5/5 per Dr. Mortimer Fries.   - 0755 hrs: Patient extubated to 2L [place]. Family notified. Patient tolerating well. NAD.   - 1015 hrs: Patient is now on RA. Dobutamine turned off to assess response. Patient is A+Ox3, forgetful, and asking for food. Planning for [place] later this afternoon.   - 1200 hrs: Patient passes YSS; regular diet ordered.  - 1345 hrs: Patient declares himself a "full code". Order changed.  - 1800 hrs: Patient remains on CRRT (tolerating well), and on Argatroban gtt. RA. Eating. NAD.

## 2019-10-30 NOTE — Progress Notes (Signed)
PALLIATIVE NOTE:  Patient successfully extubated and is now on RA. Father is at the bedside. Patient is A&O x3 and engaging in discussions with medical team.   Updates provided by [facility]. No urgent need for palliative involvement at this time, given surprising improvement.   Palliative will continue to follow and support for further needs to re-engage.   Alda Lea, AGPCNP-BC Palliative Medicine Team   No CHARGE

## 2019-10-30 NOTE — Progress Notes (Signed)
Pharmacy Antibiotic Note  Brett Small is a 44 y.o. male admitted on 10/27/2019.  Patient has past medical history of ESRD, hypertension, and peripheral artery disease.  Pharmacy has been consulted for vancomycin and cefepime dosing for sepsis.  CXR suggests pneumonia, and patient also has acute respiratory failure.  On 1/5 patient experienced cardiac arrest x2.  Patient is on HD TTS, but has been changed to CRRT inpatient due to hemodynamic instability. A random level was drawn this am prior to the dose which showed a therapeutic level. Stop dates have been placed for 7 days of therapy on cefepime, doxycycline and vancomycin  Plan: As patient is on CRRT, dose has been adjusted as follows per [facility] Antimicrobial Dosing Guidelines:  1.  Continue vancomycin 500 mg q24 hours (10 mg/kg q24 hours)  2.  continue cefepime 2 g q12 hours   Pharmacy will follow and adjust based on nephrology plan. If he transitions to back to intermittent HD doses will be adjusted by pharmacy  Height: 5\' 1"  (154.9 cm) Weight: 98 lb 1.7 oz (44.5 kg) IBW/kg (Calculated) : 52.3  Temp (24hrs), Avg:98.1 F (36.7 C), Min:97.4 F (36.3 C), Max:98.5 F (36.9 C)  Recent Labs  Lab 10/27/19 1307 10/27/19 1823 10/28/19 0045 10/28/19 0301 10/28/19 0445 10/28/19 0756 10/28/19 1443 10/29/19 0206 10/29/19 0409 10/29/19 1607 10/30/19 0430  WBC 13.3* 14.3*  --   --  9.7 8.7  --  15.9*  --   --  10.9*  CREATININE 6.34* 5.18*  --   --  5.80*  --  3.49*  --  2.02* 1.61* 1.29*  LATICACIDVEN >11.0*  7.5*  --  5.6* 3.2*  --   --   --   --   --   --   --     Estimated Creatinine Clearance: 45.5 mL/min (A) (by C-G formula based on SCr of 1.29 mg/dL (H)).    Antimicrobials this admission: Azithromycin 1/5 >> 1/6 Vanc 1/5 >>  Cefepime 1/5 >>  Doxycycline 1/6 >>  Microbiology results: 1/5 BCx: NGTD 1/5 MRSA: positive  Thank you for allowing pharmacy to be a part of this patient's care.  Dallie Piles,  PharmD 10/30/2019 7:28 AM

## 2019-10-30 NOTE — Progress Notes (Signed)
Pt was suctioned prior to extubation for a small amount of white secretions. Per Dr. Zoila Shutter order he was extubated without incident. He is voicing and is without stridor. He was placed on a 2 L nasal cannula with Sa02= 98%.

## 2019-10-31 LAB — RENAL FUNCTION PANEL
Albumin: 2.4 g/dL — ABNORMAL LOW (ref 3.5–5.0)
Albumin: 2.5 g/dL — ABNORMAL LOW (ref 3.5–5.0)
Anion gap: 6 (ref 5–15)
Anion gap: 12 (ref 5–15)
BUN: 18 mg/dL (ref 6–20)
BUN: 25 mg/dL — ABNORMAL HIGH (ref 6–20)
CO2: 26 mmol/L (ref 22–32)
CO2: 22 mmol/L (ref 22–32)
Calcium: 8.2 mg/dL — ABNORMAL LOW (ref 8.9–10.3)
Calcium: 8.6 mg/dL — ABNORMAL LOW (ref 8.9–10.3)
Chloride: 103 mmol/L (ref 98–111)
Chloride: 102 mmol/L (ref 98–111)
Creatinine, Ser: 1.11 mg/dL (ref 0.61–1.24)
Creatinine, Ser: 1.83 mg/dL — ABNORMAL HIGH (ref 0.61–1.24)
GFR calc Af Amer: >60 mL/min (ref >60)
GFR calc Af Amer: 51 mL/min — ABNORMAL LOW (ref >60)
GFR calc non Af Amer: >60 mL/min (ref >60)
GFR calc non Af Amer: 44 mL/min — ABNORMAL LOW (ref >60)
Glucose, Bld: 128 mg/dL — ABNORMAL HIGH (ref 70–99)
Glucose, Bld: 154 mg/dL — ABNORMAL HIGH (ref 70–99)
Phosphorus: 1.8 mg/dL — ABNORMAL LOW (ref 2.5–4.6)
Phosphorus: 4 mg/dL (ref 2.5–4.6)
Potassium: 4.6 mmol/L (ref 3.5–5.1)
Potassium: 4.5 mmol/L (ref 3.5–5.1)
Sodium: 135 mmol/L (ref 135–145)
Sodium: 136 mmol/L (ref 135–145)

## 2019-10-31 LAB — CBC
HCT: 22.7 % — ABNORMAL LOW (ref 39.0–52.0)
HCT: 28.5 % — ABNORMAL LOW (ref 39.0–52.0)
Hemoglobin: 6.5 g/dL — ABNORMAL LOW (ref 13.0–17.0)
Hemoglobin: 8.7 g/dL — ABNORMAL LOW (ref 13.0–17.0)
MCH: 28.4 pg (ref 26.0–34.0)
MCH: 28.7 pg (ref 26.0–34.0)
MCHC: 28.6 g/dL — ABNORMAL LOW (ref 30.0–36.0)
MCHC: 30.5 g/dL (ref 30.0–36.0)
MCV: 99.1 fL (ref 80.0–100.0)
MCV: 94.1 fL (ref 80.0–100.0)
Platelets: 58 10*3/uL — ABNORMAL LOW (ref 150–400)
Platelets: 62 10*3/uL — ABNORMAL LOW (ref 150–400)
RBC: 2.29 MIL/uL — ABNORMAL LOW (ref 4.22–5.81)
RBC: 3.03 MIL/uL — ABNORMAL LOW (ref 4.22–5.81)
RDW: 21.4 % — ABNORMAL HIGH (ref 11.5–15.5)
RDW: 21.1 % — ABNORMAL HIGH (ref 11.5–15.5)
WBC: 12.8 10*3/uL — ABNORMAL HIGH (ref 4.0–10.5)
WBC: 13 10*3/uL — ABNORMAL HIGH (ref 4.0–10.5)
nRBC: 0 % (ref 0.0–0.2)
nRBC: 0 % (ref 0.0–0.2)

## 2019-10-31 LAB — GLUCOSE, CAPILLARY
Glucose-Capillary: 125 mg/dL — ABNORMAL HIGH (ref 70–99)
Glucose-Capillary: 126 mg/dL — ABNORMAL HIGH (ref 70–99)
Glucose-Capillary: 149 mg/dL — ABNORMAL HIGH (ref 70–99)
Glucose-Capillary: 162 mg/dL — ABNORMAL HIGH (ref 70–99)
Glucose-Capillary: 176 mg/dL — ABNORMAL HIGH (ref 70–99)

## 2019-10-31 LAB — VANCOMYCIN, RANDOM: Vancomycin Rm: 13

## 2019-10-31 LAB — CULTURE, BLOOD (ROUTINE X 2)

## 2019-10-31 LAB — APTT
aPTT: 51 seconds — ABNORMAL HIGH (ref 24–36)
aPTT: 60 seconds — ABNORMAL HIGH (ref 24–36)

## 2019-10-31 LAB — MAGNESIUM: Magnesium: 2.3 mg/dL (ref 1.7–2.4)

## 2019-10-31 LAB — PREPARE RBC (CROSSMATCH)

## 2019-10-31 MED ORDER — EPOETIN ALFA 10000 UNIT/ML IJ SOLN
10000.0000 [IU] | INTRAMUSCULAR | Status: DC
  Administered 2019-11-03: 10000 [IU] via INTRAVENOUS
  Filled 2019-10-31: qty 1

## 2019-10-31 MED ORDER — VANCOMYCIN HCL 750 MG/150ML IV SOLN
750.0000 mg | Freq: Once | INTRAVENOUS | Status: AC
  Administered 2019-10-31: 22:00:00 750 mg via INTRAVENOUS
  Filled 2019-10-31: qty 150

## 2019-10-31 MED ORDER — HYDRALAZINE HCL 20 MG/ML IJ SOLN
20.0000 mg | INTRAMUSCULAR | Status: DC | PRN
  Filled 2019-10-31: qty 1

## 2019-10-31 MED ORDER — SODIUM CHLORIDE 0.9 % IV SOLN
1.0000 g | INTRAVENOUS | Status: AC
  Administered 2019-10-31 – 2019-11-03 (×4): 1 g via INTRAVENOUS
  Filled 2019-10-31 (×5): qty 1

## 2019-10-31 MED ORDER — VANCOMYCIN HCL 500 MG/100ML IV SOLN
500.0000 mg | INTRAVENOUS | Status: AC
  Administered 2019-11-03: 500 mg via INTRAVENOUS
  Filled 2019-10-31 (×2): qty 100

## 2019-10-31 MED ORDER — VANCOMYCIN VARIABLE DOSE PER UNSTABLE RENAL FUNCTION (PHARMACIST DOSING): Status: DC

## 2019-10-31 MED ORDER — VANCOMYCIN HCL 500 MG/100ML IV SOLN: 500.0000 mg | INTRAVENOUS | Status: DC

## 2019-10-31 MED ORDER — SODIUM CHLORIDE 0.9% IV SOLUTION: Freq: Once | INTRAVENOUS | Status: AC

## 2019-10-31 MED ORDER — ALTEPLASE 2 MG IJ SOLR
2.0000 mg | Freq: Once | INTRAMUSCULAR | Status: AC
  Administered 2019-10-31: 2 mg
  Filled 2019-10-31: qty 2

## 2019-10-31 MED ORDER — SODIUM PHOSPHATES 45 MMOLE/15ML IV SOLN
10.0000 mmol | Freq: Once | INTRAVENOUS | Status: AC
  Administered 2019-10-31: 10 mmol via INTRAVENOUS
  Filled 2019-10-31: qty 3.33

## 2019-10-31 NOTE — Consult Note (Signed)
[place] for Electrolyte Monitoring and Replacement   Recent Labs: Potassium (mmol/L)  Date Value  10/31/2019 4.6   Magnesium (mg/dL)  Date Value  10/31/2019 2.3   Calcium (mg/dL)  Date Value  10/31/2019 8.2 (L)   Albumin (g/dL)  Date Value  10/31/2019 2.4 (L)   Phosphorus (mg/dL)  Date Value  10/31/2019 1.8 (L)   Sodium (mmol/L)  Date Value  10/31/2019 135   Corrected Ca: 9.1 mg/dL  Assessment: Brett Small is a 44 y.o. male admitted on 10/27/2019 with sepsis and acute respiratory failure, as well as cardiac arrest x2 (but did not qualify for hypothermia protocol).  Patient has past medical history of ESRD, hypertension, and peripheral artery disease.   Patient normally receives HD TTS, but was changed to CRRT inpatient due to hemodynamic instability. Patient is on Veltassa.  Corrected Ca:  9.4  Goal of Therapy:  K 4.0-5.1 mmol/L Magnesium 2.0-2.4 mg/dL All other electrolytes WNL  Plan:  Patient has transitioned off of CRRT at this time. Plan for HD pending at this time. Per discussion with Nephrologist, will replace phos with Na phos 10 mmol IV x 1. Defer additional electrolyte replacement at this time. Electrolytes ordered with am labs.  Tawnya Crook, PharmD Clinical Pharmacist 10/31/2019 9:07 AM

## 2019-10-31 NOTE — Progress Notes (Signed)
Pre HD Tx Note Arterial Line no caps on and found to be occluded. Nephrologist is notified and Alteplase has been ordered. Pt is A*3.ddisorientd to date. Pt is on 3-4L [place] O2. SPO2 ranging from 89-97. Pt reports no chest pain or SOB. Left arm swollen red and wam to touch Ice pack given as per Pt request.    10/31/19 1800  Hand-Off documentation  Report given to (Full Name) Newt Minion RN   Report received from (Full Name) Iverson Alamin RN   Vital Signs  Temp 98.8 F (37.1 C)  Temp Source Oral  Pulse Rate (!) 119  Pulse Rate Source Monitor  Resp 12  BP (!) 146/110  BP Location Right Leg  BP Method Automatic  Patient Position (if appropriate) Lying  Oxygen Therapy  SpO2 90 %  O2 Device Nasal Cannula  O2 Flow Rate (L/min) 4 L/min  Pain Assessment  Pain Scale 0-10  Pain Score 0  Time-Out for Hemodialysis  What Procedure? HD  Pt Identifiers(min of two) First/Last Name;MRN/Account#  Correct Site? Yes  Correct Side? Yes  Correct Procedure? Yes  Consents Verified? Yes  Safety Precautions Reviewed? Yes  Engineer, civil (consulting) Number [place] Number  (ICU 20)  UF/Alarm Test Passed  Conductivity: Meter 14.1  Conductivity: Machine  13.8  pH 7.6  Reverse Osmosis  (Portable RO2/21)  Normal Saline Lot Number [id number]  Dialyzer Lot Number [id number]  Disposable Set Lot Number [id number]  Machine Temperature (!) 96.1 F (35.6 C)  Musician and Audible Yes  Blood Lines Intact and Secured Yes  Pre Treatment Patient Checks  Vascular access used during treatment Catheter  HD catheter dressing before treatment  (Missing Caps Line Occluded)  Patient is receiving dialysis in a chair  (in Bed)  Hepatitis B Surface Antigen Results Negative  Date Hepatitis B Surface Antigen Drawn 10/03/19  Hepatitis B Surface Antibody  (Unknown)  Date Hepatitis B Surface Antibody Drawn  (10/03/2019)  Hemodialysis Consent Verified Yes  Hemodialysis Standing Orders Initiated Yes  ECG (Telemetry)  Monitor On Yes  Prime Ordered Normal Saline  Length of  DialysisTreatment -hour(s) 3 Hour(s)  Dialysis Treatment Comments  (Na140)  Dialyzer Elisio 17H NR  Dialysate 3K;2.5 Ca  Dialysis Anticoagulant None  Dialysate Flow Ordered 600  Blood Flow Rate Ordered 400 mL/min  Ultrafiltration Goal 1 Liters  Dialysis Blood Pressure Support Ordered Albumin  Education / Care Plan  Dialysis Education Provided Yes  Documented Education in Care Plan Yes  Hemodialysis Catheter Right Internal jugular Double lumen Permanent (Tunneled)  No Placement Date or Time found.   Placed prior to admission: Yes  Orientation: Right  Access Location: Internal jugular  Hemodialysis Catheter Type: Double lumen Permanent (Tunneled)  Site Condition Other (Comment) (Arterial Line Occluded)  Blue Lumen Status Flushed;Blood return noted  Red Lumen Status No blood return;Occluded  Purple Lumen Status N/A  Catheter fill solution Heparin 1000 units/ml  Catheter fill volume (Arterial) 2 cc  Catheter fill volume (Venous) 2.2  Dressing Type Biopatch;Occlusive  Dressing Status Clean;Dry;Intact  Interventions Dressing reinforced  Drainage Description None

## 2019-10-31 NOTE — Progress Notes (Signed)
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  Patient Measurements: Height: 5\' 1"  (154.9 cm) Weight: 98 lb 1.7 oz (44.5 kg) IBW/kg (Calculated) : 52.3 HEPARIN DW (KG): 46.8  Vital Signs: Temp: 98.4 F (36.9 C) (01/09 1200) Temp Source: Oral (01/09 1200) Pulse Rate: 115 (01/09 1000)  Labs: Recent Labs    10/29/19 2200 10/30/19 0430 10/30/19 0943 10/30/19 1921 10/31/19 0050 10/31/19 0532 10/31/19 1609  HGB   < > 7.5*  --   --  6.5* 8.7*  --   HCT  --  25.2*  --   --  22.7* 28.5*  --   PLT  --  36*  --   --  58* 62*  --   APTT  --   --  76*  --   --  51* 60*  CREATININE   < > 1.29*  --  1.20  --  1.11 1.83*   < > = values in this interval not displayed.   Estimated Creatinine Clearance: 32.1 mL/min (A) (by C-G formula based on SCr of 1.83 mg/dL (H)).  Medical History: Past Medical History:  Diagnosis Date  . ESRD (end stage renal disease) ([place])   . Hypertension   . PAD (peripheral artery disease) ([facility])    Required aortofemoral stent-which had closed and had to redo the procedure and ischemia of limb.  . Peripheral vascular disease ([place])   . Renal disorder   . Secondary hyperparathyroidism of renal origin [facility])    Medications:  Medications Prior to Admission  Medication Sig Dispense Refill Last Dose  . amLODipine (NORVASC) 5 MG tablet Take 1 tablet (5 mg total) by mouth daily. 30 tablet 3 unknown at unknown  . carvedilol (COREG) 25 MG tablet Take 1 tablet (25 mg total) by mouth 2 (two) times daily with a meal. 60 tablet 1 unknown at unknown  . apixaban (ELIQUIS) 2.5 MG TABS tablet Take 1 tablet (2.5 mg total) by mouth 2 (two) times daily. (Patient not taking: Reported on 10/28/2019) 60 tablet 1 Not Taking at Unknown time  . atorvastatin (LIPITOR) 80 MG tablet Take 1 tablet (80 mg total) by mouth at bedtime. (Patient not taking: Reported on 10/28/2019) 30 tablet 1 Not Taking at Unknown time  . b complex-C-folic acid 1 MG capsule  Take 1 capsule by mouth daily after supper.    Not Taking at Unknown time  . carvedilol (COREG) 25 MG tablet Take 1 tablet (25 mg total) by mouth 2 (two) times daily with a meal. (Patient not taking: Reported on 10/28/2019) 60 tablet 0 Not Taking at Unknown time  . gabapentin (NEURONTIN) 100 MG capsule Take 1 capsule (100 mg total) by mouth 3 (three) times daily. (Patient not taking: Reported on 10/28/2019) 90 capsule 1 Not Taking at Unknown time  . hydrALAZINE (APRESOLINE) 25 MG tablet Take 1 tablet (25 mg total) by mouth every 8 (eight) hours. (Patient not taking: Reported on 10/28/2019) 90 tablet 1 Not Taking at Unknown time  . hydrOXYzine (ATARAX/VISTARIL) 25 MG tablet Take 25 mg by mouth 3 (three) times daily as needed for anxiety.   Not Taking at Unknown time  . levETIRAcetam (KEPPRA) 500 MG tablet Take 1 tablet (500 mg total) by mouth 2 (two) times daily. (Patient not taking: Reported on 10/28/2019) 60 tablet 0 Not Taking at Unknown time  . loratadine (CLARITIN) 10 MG tablet Take 1 tablet (10 mg total) by mouth daily. (Patient not taking: Reported on 10/28/2019) 30 tablet 0 Not  Taking at Unknown time  . multivitamin (RENA-VIT) TABS tablet Take 1 tablet by mouth daily. (Patient not taking: Reported on 10/28/2019) 30 each 1 Not Taking at Unknown time  . neomycin-bacitracin-polymyxin (NEOSPORIN) OINT Apply 1 application topically 2 (two) times daily. (Patient not taking: Reported on 10/28/2019) 56 g 1 Not Taking at Unknown time  . Nutritional Supplements (FEEDING SUPPLEMENT, NEPRO CARB STEADY,) LIQD Take 237 mLs by mouth 2 (two) times daily between meals. (Patient not taking: Reported on 10/28/2019) 237 mL 30 Not Taking at Unknown time  . sevelamer carbonate (RENVELA) 800 MG tablet Take 2 tablets (1,600 mg total) by mouth 3 (three) times daily with meals. 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  Patient's is in critical status with multiorgan failure, poor prognosis, and he has high risk for subsequent cardiac arrest. 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 HIT antibody negative, SRA pending  Goal of Therapy:  Monitor platelets by anticoagulation protocol: Yes  Target aPTT 50-90 seconds  Course of Therapy Argatroban initiated at 0.5 mcg/kg/min for ICU patients per protocol. 0106 1137 aPTT 113s: dec infusion to 0.35 mcgkg/min 0106 1443 aPTT 107s: dec infusion to 0.30 mcg/kg/min 0106 2206 aPTT 92s: no change 0107 0206 aPTT 110s: dec infusion to 0.20 mcg/kg/min 0107 0726 aPTT 108s: dec infusion to 0.15 mcg/kg/min 0107 1241 aPTT 94s: dec infusion to 0.10 mcg/kg/min 0107 1815 aPTT 88s: continue infusion at 0.10 mcg/kg/min 0107 2200 aPTT 75s: continue infusion at 0.54mcg/kg/min 0108 0943 aPTT 76s: continue infusion at 0.15mcg/kg/min 0109 0532 aPTT 51s: continue infusion at 0.10 mcg/kg/min  0109 1609 aPTT 60s:  Plan:  Continue argatraban at 0.10 mcg/kg/min.  CRRT discontinued.   Will recheck aPTT with am labs  Lu Duffel, PharmD, BCPS Clinical Pharmacist 10/31/2019 6:39 PM

## 2019-10-31 NOTE — Progress Notes (Signed)
[facility]  ROUNDING NOTE   Subjective:   Taken off CRRT this morning at 530am.   He states that he is doing well.   Objective:  Vital signs in last 24 hours:  Temp:  [97.1 F (36.2 C)-98.3 F (36.8 C)] 98.3 F (36.8 C) (01/09 0700) Pulse Rate:  [91-129] 115 (01/09 1000) Resp:  [9-23] 13 (01/09 1000) BP: (135)/(90) 135/90 (01/09 0420) SpO2:  [90 %-100 %] 97 % (01/09 1000) Arterial Line BP: (110-145)/(70-98) 145/97 (01/09 1000) Weight:  [44.5 kg] 44.5 kg (01/09 0500)  Weight change: 0 kg Filed Weights   10/29/19 0500 10/30/19 0400 10/31/19 0500  Weight: 45.5 kg 44.5 kg 44.5 kg    Intake/Output: I/O last 3 completed shifts: In: 2635.9 [P.O.:668; I.V.:348; Blood:290; NG/GT:40; IV Piggyback:1289.8] Out: 2633 [Other:3102]   Intake/Output this shift:  No intake/output data recorded.  Physical Exam: General: NAD, laying in bed  Head: Normocephalic, atraumatic. Moist oral mucosal membranes  Eyes: Anicteric, PERRL  Neck: Supple, trachea midline  Lungs:  Clear to auscultation  Heart: tachycardia  Abdomen:  Soft, nontender,   Extremities: no peripheral edema.  Neurologic: Nonfocal, moving all four extremities  Skin: No lesions  Access: RIJ permcath, Clotted left AVG    Basic Metabolic Panel: Recent Labs  Lab 10/28/19 0445 10/29/19 0206 10/29/19 0409 10/29/19 1607 10/30/19 0430 10/30/19 1921 10/31/19 0532  NA 132*  --  138 136 136 134* 135  K 4.7  --  4.6 4.4 4.6 4.4 4.6  CL 87*  --  101 103 103 104 103  CO2 30  --  '25 24 24 ' 21* 26  GLUCOSE 125*  --  189* 118* 135* 144* 128*  BUN 72*  --  32* 27* 22* 21* 18  CREATININE 5.80*  --  2.02* 1.61* 1.29* 1.20 1.11  CALCIUM 8.2*  --  8.1* 7.9* 7.6* 7.8* 8.2*  MG 1.8 2.4  --   --  2.2  --  2.3  PHOS 8.3*  --  3.2 2.3* 2.2* 1.6* 1.8*    Liver Function Tests: Recent Labs  Lab 10/27/19 1823 10/28/19 0445 10/29/19 0409 10/29/19 1607 10/30/19 0430 10/30/19 1921 10/31/19 0532  AST 29 37  --   --    --   --   --   ALT 14 19  --   --   --   --   --   ALKPHOS 63 58  --   --   --   --   --   BILITOT 1.9* 2.5*  --   --   --   --   --   PROT 5.0* 4.3*  --   --   --   --   --   ALBUMIN 2.1* 2.0* 2.1* 2.0* 2.1* 2.1* 2.4*   No results for input(s): LIPASE, AMYLASE in the last 168 hours. No results for input(s): AMMONIA in the last 168 hours.  CBC: Recent Labs  Lab 10/28/19 0756 10/29/19 0206 10/30/19 0430 10/31/19 0050 10/31/19 0532  WBC 8.7 15.9* 10.9* 12.8* 13.0*  NEUTROABS 7.7  --   --   --   --   HGB 7.3* 8.2* 7.5* 6.5* 8.7*  HCT 22.4* 26.7* 25.2* 22.7* 28.5*  MCV 91.8 94.3 95.8 99.1 94.1  PLT 50* 79* 36* 58* 62*    Cardiac Enzymes: No results for input(s): CKTOTAL, CKMB, CKMBINDEX, TROPONINI in the last 168 hours.  BNP: Invalid input(s): POCBNP  CBG: Recent Labs  Lab 10/30/19 1130 10/30/19 1811  10/30/19 2311 10/30/19 2342 10/31/19 0614  GLUCAP 151* 146* 164* 153* 126*    Microbiology: Results for orders placed or performed during the hospital encounter of 10/27/19  Blood Culture (routine x 2)     Status: Abnormal   Collection Time: 10/27/19  1:07 PM   Specimen: BLOOD  Result Value Ref Range Status   Specimen Description   Final    BLOOD BLOOD RIGHT HAND Performed at [facility], [address] [place], [place] [address]    Special Requests   Final    BOTTLES DRAWN AEROBIC AND ANAEROBIC Blood Culture results may not be optimal due to an inadequate volume of blood received in culture bottles Performed at [facility], [address] [place], [place] [address]    Culture  Setup Time   Final    GRAM POSITIVE RODS AEROBIC BOTTLE ONLY CRITICAL RESULT CALLED TO, READ BACK BY AND VERIFIED WITH: SCOTT HALL 10/28/19 @ 2210  [place] Performed at [facility], [address] [place], [place] [address]    Culture CLOSTRIDIUM SPECIES (A)  Final   Report Status 10/31/2019 FINAL  Final  Blood Culture (routine x 2)     Status: None  (Preliminary result)   Collection Time: 10/27/19  1:07 PM   Specimen: BLOOD  Result Value Ref Range Status   Specimen Description BLOOD BLOOD LEFT HAND  Final   Special Requests   Final    BOTTLES DRAWN AEROBIC ONLY Blood Culture results may not be optimal due to an inadequate volume of blood received in culture bottles   Culture   Final    NO GROWTH 4 DAYS Performed at [facility], [address] [place], [place] [address]    Report Status PENDING  Incomplete  SARS CORONAVIRUS 2 (TAT 6-24 HRS) Nasopharyngeal Nasopharyngeal Swab     Status: None   Collection Time: 10/27/19  3:54 PM   Specimen: Nasopharyngeal Swab  Result Value Ref Range Status   SARS Coronavirus 2 NEGATIVE NEGATIVE Final    Comment: (NOTE) SARS-CoV-2 target nucleic acids are NOT DETECTED. The SARS-CoV-2 RNA is generally detectable in upper and lower respiratory specimens during the acute phase of infection. Negative results do not preclude SARS-CoV-2 infection, do not rule out co-infections with other pathogens, and should not be used as the sole basis for treatment or other patient management decisions. Negative results must be combined with clinical observations, patient history, and epidemiological information. The expected result is Negative. Fact Sheet for Patients: SugarRoll.be Fact Sheet for Healthcare Providers: https://www.woods-mathews.com/ This test is not yet approved or cleared by the [place] FDA and  has been authorized for detection and/or diagnosis of SARS-CoV-2 by FDA under an Emergency Use Authorization (EUA). This EUA will remain  in effect (meaning this test can be used) for the duration of the COVID-19 declaration under Section 56 4(b)(1) of the Act, 21 U.S.C. section 360bbb-3(b)(1), unless the authorization is terminated or revoked sooner. Performed at [facility], [place] [address], [place],  [address]   MRSA PCR  Screening     Status: Abnormal   Collection Time: 10/27/19  9:52 PM   Specimen: Nasopharyngeal  Result Value Ref Range Status   MRSA by PCR POSITIVE (A) NEGATIVE Final    Comment:        The GeneXpert MRSA Assay (FDA approved for NASAL specimens only), is one component of a comprehensive MRSA colonization surveillance program. It is not intended to diagnose MRSA infection nor  to guide or monitor treatment for MRSA infections. RESULT CALLED TO, READ BACK BY AND VERIFIED WITH: Shary Key RN [phone] 10/27/19 [place] Performed at [facility], [address] [place], [place] [address]     Coagulation Studies: No results for input(s): LABPROT, INR in the last 72 hours.  Urinalysis: No results for input(s): COLORURINE, LABSPEC, PHURINE, GLUCOSEU, HGBUR, BILIRUBINUR, KETONESUR, PROTEINUR, UROBILINOGEN, NITRITE, LEUKOCYTESUR in the last 72 hours.  Invalid input(s): APPERANCEUR    Imaging: DG Abd 1 View  Result Date: 10/29/2019 CLINICAL DATA:  Orogastric tube placement EXAM: ABDOMEN - 1 VIEW COMPARISON:  Portable exam 1328 hours compared to 10/28/2019 FINDINGS: Nasogastric tube coiled in proximal stomach. Tips of dual lumen RIGHT jugular central venous catheter projects over cavoatrial junction and upper RIGHT atrium. Tip of endotracheal tube projects approximately 3.9 cm above carina. Enlargement of cardiac silhouette with atelectasis versus consolidation of LEFT lower lobe and associated small LEFT pleural effusion. Vascular stents identified in upper abdomen. IMPRESSION: Nasogastric tube coiled in proximal stomach. Persistent atelectasis versus consolidation LEFT lower lobe with associated small LEFT pleural effusion. Electronically Signed   By: Lavonia Dana M.D.   On: 10/29/2019 13:45     Medications:   .  prismasol BGK 4/2.5 400 mL/hr at 10/30/19 0738  .  prismasol BGK 4/2.5 400 mL/hr at 10/30/19 0738  . sodium chloride 5 mL/hr at 10/31/19 0236  . argatroban 0.1 mcg/kg/min  (10/31/19 0236)  . ceFEPime (MAXIPIME) IV    . citrate dextrose    . dexmedetomidine (PRECEDEX) IV infusion Stopped (10/30/19 0703)  . DOBUTamine Stopped (10/30/19 1008)  . doxycycline (VIBRAMYCIN) IV 100 mg (10/31/19 1020)  . feeding supplement (VITAL AF 1.2 CAL) Stopped (10/29/19 1305)  . norepinephrine (LEVOPHED) Adult infusion Stopped (10/29/19 1316)  . phenylephrine (NEO-SYNEPHRINE) Adult infusion    . prismasol BGK 4/2.5 1,000 mL/hr at 10/30/19 1809  . sodium phosphate  Dextrose 5% IVPB 10 mmol (10/31/19 1033)  . vasopressin (PITRESSIN) infusion - *FOR SHOCK* Stopped (10/27/19 2209)   . B-complex with vitamin C  1 tablet Per Tube QHS  . chlorhexidine gluconate (MEDLINE KIT)  15 mL Mouth Rinse BID  . Chlorhexidine Gluconate Cloth  6 each Topical Daily  . hydrocortisone sod succinate (SOLU-CORTEF) inj  50 mg Intravenous Q6H  . insulin aspart  0-9 Units Subcutaneous Q6H  . mouth rinse  15 mL Mouth Rinse QID  . mupirocin ointment  1 application Nasal BID  . pantoprazole sodium  40 mg Per Tube QHS  . patiromer  8.4 g Oral Daily  . sodium chloride flush  10-40 mL Intracatheter Q12H  . vancomycin variable dose per unstable renal function (pharmacist dosing)   Does not apply See admin instructions   sodium chloride, acetaminophen **OR** acetaminophen, bisacodyl, citrate dextrose, fentaNYL (SUBLIMAZE) injection, LORazepam, magnesium citrate, ondansetron **OR** ondansetron (ZOFRAN) IV, polyethylene glycol, sodium chloride flush, traMADol  Assessment/ Plan:  Brett Small is a 44 y.o. white male with end stage renal disease on hemodialysis, 44 y.o. male with end-stage renal disease on hemodialysis, hypertension, peripheral vascular disease, depression, tobacco abuse, admitted to [facility] ICU on 10/27/2019 for Cardiac arrest [facility]) [I46.9] Hyperkalemia [E87.5] Difficult intubation [T88.4XXA] Sepsis due to pneumonia ([place]) [J18.9, A41.9] Sepsis, due to unspecified organism, unspecified whether  acute organ dysfunction present ([place]) [A41.9]   Placed on CRRT from 1/5 -1/9. Now off vasopressors.   [place] TTS RIJ catheter  1.  ESRD on HD TTS.   - hemodialysis treatment for  today. Orders prepared.   2.  Anemia of chronic kidney disease.  Hemoglobin 8.7 With thrombocytopenia - EPO with HD treatment  3. Hypertension: hemodynamically stable off vasopressors. Tachycardia on examination.  Home regimen of amlodipine, carvedilol, and hydralazine.  Currently holding all agents.  4.  Secondary hyperparathyroidism with hypophosphatemia - IV resplacement.    LOS: 4 Sharmayne Jablon 1/9/202110:48 AM

## 2019-10-31 NOTE — Progress Notes (Signed)
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  He presented to the [facility] ED on 10/26/2018 with SOB, emesis, and chest pain.  He was found to have sepsis due to pneumonia, NSTEMI and Metabolic acidosis.  He was admitted to the ICU with acute respiratory distress.  Prior to transfer to the ICU, patient coded in the ED with PEA.  CPR was initiated and patient was given 2 rounds of epi and calcium gluconate.     He developed a DVT in his right radial artery and PAD in his right hand.     Lines / Drains: Hemodialysis catheter right IJ Fistula, left upper arm CVC triple lumen right femoral Arterial line right femoral  Cultures / Sepsis markers: Blood cultures negative, MRSA swab positive.  WBC improving from 15.9 yesterday to 10.9 today.    Antibiotics: Vancomycin Doxycycline Cefepime  Protocols / Consultants: Nephrology Vascular surgery  Tests / Events: 01/05: Patient presented to ED with SOB, emesis, chest pain. ED workup consistent with NSTEMI and pneumonia with sepsis. Patient coded twice due to PEA in ED for a combined time of 21 minutes. Admitted to ICU. 01/06: Patient's right hand cold and mottled concerning for right forearm DVT. Venous doppler ultrasound consistent with right forearm radial vein DVT. EEG demonstrated nonspecific bitemporal cortical dysfunction and severe diffuse encephalopathy.   01/08: Patient becoming increasingly responsive, ET tube removed.  Patient started on 2L  1/9 alert and awake, off pressors, no oxygen needs Transfer to [facility] service   CC Follow up Resp failure  HPI Off vent Off  vasopressors Alert and awake Follows commands S/p NSTEMI S/p cardiac arrest Off CRRT    PAST MEDICAL HISTORY   Past Medical History:  Diagnosis Date  . ESRD (end stage renal disease) ([place])   . Hypertension   . PAD (peripheral artery disease) (H[facility])    Required aortofemoral stent-which had closed and had to redo the procedure and ischemia of limb.  . Peripheral vascular disease ([place])   . Renal disorder   . Secondary hyperparathyroidism of renal origin [facility])      SURGICAL HISTORY   Past Surgical History:  Procedure Laterality Date  . A/V SHUNT INTERVENTION Left 01/19/2019   Procedure: LEFT UPPER EXTREMITY A/V SHUNTOGRAM / UPPER EXTREMITY ANGIOGRAM;  Surgeon: Algernon Huxley, MD;  Location: [place] CV LAB;  Service: Cardiovascular;  Laterality: Left;  . A/V SHUNT INTERVENTION Left 03/02/2019   Procedure: A/V SHUNT INTERVENTION;  Surgeon: Algernon Huxley, MD;  Location: [place] CV LAB;  Service: Cardiovascular;  Laterality: Left;  . AORTA - FEMORAL ARTERY BYPASS GRAFT    . AV FISTULA PLACEMENT Left 12/26/2018   Procedure: INSERTION OF GORE STRETCH VASCULAR 4-7MM X  45CM IN LEFT UPPER ARM;  Surgeon: Marty Heck, MD;  Location: [place];  Service: Vascular;  Laterality: Left;  . AV FISTULA PLACEMENT Right 08/12/2019   Procedure: INSERTION OF ARTERIOVENOUS (AV) GORE-TEX GRAFT ARM;  Surgeon: Algernon Huxley, MD;  Location: [facility] ORS;  Service: Vascular;  Laterality: Right;  . DIALYSIS/PERMA CATHETER REMOVAL N/A 02/06/2019   Procedure: DIALYSIS/PERMA CATHETER REMOVAL;  Surgeon: Algernon Huxley, MD;  Location: [place] CV LAB;  Service: Cardiovascular;  Laterality: N/A;  . ESOPHAGOGASTRODUODENOSCOPY N/A 10/07/2019   Procedure: ESOPHAGOGASTRODUODENOSCOPY (EGD);  Surgeon: Lin Landsman, MD;  Location: [facility] ENDOSCOPY;  Service: Gastroenterology;  Laterality: N/A;  . LEFT HEART CATH AND CORONARY ANGIOGRAPHY Right 07/10/2019   Procedure: LEFT HEART CATH AND CORONARY ANGIOGRAPHY;   Surgeon: Dionisio David, MD;  Location: [place] CV LAB;  Service: Cardiovascular;  Laterality: Right;  . PERIPHERAL VASCULAR THROMBECTOMY Left 07/28/2019   Procedure: Left Upper Extremity Dialysis Access Declot;  Surgeon: Katha Cabal, MD;  Location: [place] CV LAB;  Service: Cardiovascular;  Laterality: Left;  . REMOVAL OF A DIALYSIS CATHETER Right 08/26/2019   Procedure: REMOVAL OF A DIALYSIS CATHETER;  Surgeon: Algernon Huxley, MD;  Location: [facility] ORS;  Service: Vascular;  Laterality: Right;  . TEMPORARY DIALYSIS CATHETER N/A 08/25/2019   Procedure: TEMPORARY DIALYSIS CATHETER;  Surgeon: Katha Cabal, MD;  Location: [place] CV LAB;  Service: Cardiovascular;  Laterality: N/A;  . UPPER EXTREMITY ANGIOGRAPHY Left 08/13/2019   Procedure: UPPER EXTREMITY ANGIOGRAPHy;  Surgeon: Algernon Huxley, MD;  Location: [place] CV LAB;  Service: Cardiovascular;  Laterality: Left;     FAMILY HISTORY   Family History  Problem Relation Age of Onset  . Hypertension Other   . Diabetes Other   . Clotting disorder Father      SOCIAL HISTORY   Social History   Tobacco Use  . Smoking status: Current Every Day Smoker    Packs/day: 0.50    Years: 30.00    Pack years: 15.00    Types: Cigarettes    Last attempt to quit: 06/26/2018    Years since quitting: 1.3  . Smokeless tobacco: Never Used  . Tobacco comment: smoked for 30 years   Substance Use Topics  . Alcohol use: Not Currently  . Drug use: Yes    Frequency: 1.0 times per week    Types: Marijuana    Comment: Pt states "maybe once or twice a week".      MEDICATIONS   Current Medication:  Current Facility-Administered Medications:  .   prismasol BGK 4/2.5 infusion, , CRRT, Continuous, Lateef, Munsoor, MD, Last Rate: 400 mL/hr at 10/30/19 0738, New Bag at 10/30/19 0738 .   prismasol BGK 4/2.5 infusion, , CRRT, Continuous, Lateef, Munsoor, MD, Last Rate: 400 mL/hr at 10/30/19 0738, New Bag at 10/30/19 0738 .  0.9 %   sodium chloride infusion, , Intravenous, PRN, Tyler Pita, MD, Last Rate: 5 mL/hr at 10/31/19 0236, Rate Verify at 10/31/19 0236 .  acetaminophen (TYLENOL) tablet 650 mg, 650 mg, Oral, Q6H PRN, 650 mg at 10/30/19 1538 **OR** acetaminophen (TYLENOL) suppository 650 mg, 650 mg, Rectal, Q6H PRN, Nicole Kindred A, DO .  argatroban 1 mg/mL infusion, 0.1 mcg/kg/min, Intravenous, Continuous, Dallie Piles, RPH, Last Rate: 0.28 mL/hr at 10/31/19 0236, 0.1 mcg/kg/min at 10/31/19 0236 .  B-complex with vitamin C tablet 1 tablet, 1 tablet, Per Tube, QHS, Tyler Pita, MD, 1 tablet at 10/30/19 2132 .  bisacodyl (DULCOLAX) EC tablet 5 mg, 5 mg, Oral, Daily PRN, Nicole Kindred A, DO .  ceFEPIme (MAXIPIME) 1 g in sodium chloride 0.9 % 100 mL IVPB, 1 g, Intravenous, Q24H, Sima Lindenberger, MD .  chlorhexidine gluconate (MEDLINE KIT) (PERIDEX) 0.12 % solution 15 mL, 15 mL, Mouth Rinse, BID, Sequoya Hogsett, MD, 15 mL at 10/31/19 0810 .  Chlorhexidine Gluconate Cloth 2 % PADS 6 each, 6 each, Topical, Daily, Darel Hong D, NP, 6 each at 10/31/19 1017 .  citrate dextrose (ACD-A anticoagulant) solution 5 mL, 5 mL, Intravenous, PRN,  Flora Lipps, MD, 5 mL at 10/31/19 0528 .  dexmedetomidine (PRECEDEX) 400 MCG/100ML (4 mcg/mL) infusion, 0.4-1.2 mcg/kg/hr, Intravenous, Titrated, Ena Dawley, RPH, Stopped at 10/30/19 0703 .  DOBUTamine (DOBUTREX) infusion 4000 mcg/mL, 2.5-20 mcg/kg/min, Intravenous, Titrated, Tyler Pita, MD, Stopped at 10/30/19 1008 .  doxycycline (VIBRAMYCIN) 100 mg in sodium chloride 0.9 % 250 mL IVPB, 100 mg, Intravenous, Q12H, Talayeh Bruinsma, MD, Last Rate: 125 mL/hr at 10/31/19 1020, 100 mg at 10/31/19 1020 .  [START ON 11/03/2019] epoetin alfa (EPOGEN) injection 10,000 Units, 10,000 Units, Intravenous, Q T,Th,Sa-HD, Kolluru, Sarath, MD .  feeding supplement (VITAL AF 1.2 CAL) liquid 1,000 mL, 1,000 mL, Per Tube, Continuous, Tyler Pita, MD, Stopped at 10/29/19 1305 .   fentaNYL (SUBLIMAZE) injection 100 mcg, 100 mcg, Intravenous, Q1H PRN, Mortimer Fries, Mehgan Santmyer, MD, 100 mcg at 10/31/19 1012 .  hydrocortisone sodium succinate (SOLU-CORTEF) 100 MG injection 50 mg, 50 mg, Intravenous, Q6H, Darel Hong D, NP, 50 mg at 10/31/19 0811 .  insulin aspart (novoLOG) injection 0-9 Units, 0-9 Units, Subcutaneous, Q6H, Dallie Piles, RPH, 1 Units at 10/31/19 1540 .  LORazepam (ATIVAN) injection 2-4 mg, 2-4 mg, Intravenous, Q4H PRN, Flora Lipps, MD, 2 mg at 10/30/19 2320 .  magnesium citrate solution 1 Bottle, 1 Bottle, Oral, Once PRN, Nicole Kindred A, DO .  MEDLINE mouth rinse, 15 mL, Mouth Rinse, QID, Mylissa Lambe, MD, 15 mL at 10/31/19 1020 .  mupirocin ointment (BACTROBAN) 2 % 1 application, 1 application, Nasal, BID, Tyler Pita, MD, 1 application at 08/67/61 1020 .  norepinephrine (LEVOPHED) 16 mg in 237m premix infusion, 0-40 mcg/min, Intravenous, Titrated, KBradly Bienenstock NP, Stopped at 10/29/19 1316 .  ondansetron (ZOFRAN) tablet 4 mg, 4 mg, Oral, Q6H PRN **OR** ondansetron (ZOFRAN) injection 4 mg, 4 mg, Intravenous, Q6H PRN, GNicole KindredA, DO, 4 mg at 10/30/19 1609 .  pantoprazole sodium (PROTONIX) 40 mg/20 mL oral suspension 40 mg, 40 mg, Per Tube, QHS, GTyler Pita MD, 40 mg at 10/29/19 2115 .  patiromer (Daryll Drown packet 8.4 g, 8.4 g, Oral, Daily, BGregor Hams MD, 8.4 g at 10/31/19 1021 .  phenylephrine (NEO-SYNEPHRINE) 10 mg in sodium chloride 0.9 % 250 mL (0.04 mg/mL) infusion, 0-400 mcg/min, Intravenous, Titrated, Elianah Karis, MD .  polyethylene glycol (MIRALAX / GLYCOLAX) packet 17 g, 17 g, Oral, Daily PRN, GNicole KindredA, DO .  prismasol BGK 4/2.5 infusion, , CRRT, Continuous, Lateef, Munsoor, MD, Last Rate: 1,000 mL/hr at 10/30/19 1809, New Bag at 10/30/19 1809 .  sodium chloride flush (NS) 0.9 % injection 10-40 mL, 10-40 mL, Intracatheter, Q12H, KDarel HongD, NP, 10 mL at 10/31/19 1022 .  sodium chloride flush (NS) 0.9 %  injection 10-40 mL, 10-40 mL, Intracatheter, PRN, KDarel HongD, NP, 30 mL at 10/30/19 0255 .  sodium phosphate 10 mmol in dextrose 5 % 250 mL infusion, 10 mmol, Intravenous, Once, Kolluru, Sarath, MD, Last Rate: 42 mL/hr at 10/31/19 1033, 10 mmol at 10/31/19 1033 .  traMADol (ULTRAM) tablet 50 mg, 50 mg, Oral, Q12H PRN, GNicole KindredA, DO, 50 mg at 10/30/19 1358 .  vancomycin variable dose per unstable renal function (pharmacist dosing), , Does not apply, See admin instructions, KFlora Lipps MD .  vasopressin (PITRESSIN) 40 Units in sodium chloride 0.9 % 250 mL (0.16 Units/mL) infusion, 0.03 Units/min, Intravenous, Continuous, KBradly Bienenstock NP, Stopped at 10/27/19 2209    ALLERGIES   Codeine, Sulfa antibiotics, and Heparin    REVIEW OF SYSTEMS  Review of Systems  Constitutional: Negative for chills, fever and malaise/fatigue.  Respiratory: Negative for shortness of breath.   Cardiovascular: Negative for chest pain and palpitations.  Gastrointestinal: Negative for abdominal pain, diarrhea, nausea and vomiting.  Neurological: Negative for headaches.  All other systems reviewed and are negative.    PHYSICAL EXAMINATION   Vital Signs: Temp:  [97.1 F (36.2 C)-98.4 F (36.9 C)] (P) 98.4 F (36.9 C) (01/09 1200) Pulse Rate:  [110-129] 115 (01/09 1000) Resp:  [9-23] 13 (01/09 1000) BP: (135)/(90) 135/90 (01/09 0420) SpO2:  [90 %-100 %] 97 % (01/09 1000) Arterial Line BP: (110-145)/(70-98) 145/97 (01/09 1000) Weight:  [44.5 kg] 44.5 kg (01/09 0500)  Physical Examination:   GENERAL:NAD, no fevers, chills, no weakness no fatigue HEAD: Normocephalic, atraumatic.  EYES: PERLA, EOMI No scleral icterus.  MOUTH: Moist mucosal membrane.  EAR, NOSE, THROAT: Clear without exudates. No external lesions.  NECK: Supple. No thyromegaly.  No JVD.  PULMONARY: CTA B/L no wheezing, rhonchi, crackles CARDIOVASCULAR: S1 and S2. Regular rate and rhythm. No  murmurs GASTROINTESTINAL: Soft, nontender, nondistended. Positive bowel sounds.  MUSCULOSKELETAL: +edema and swelling RT hand NEUROLOGIC: No gross focal neurological deficits. 5/5 strength all extremities SKIN: No ulceration, lesions, rashes, or cyanosis.  PSYCHIATRIC: Insight, judgment intact. -depression -anxiety ALL OTHER ROS ARE NEGATIVE      PERTINENT DATA    Infusions: .  prismasol BGK 4/2.5 400 mL/hr at 10/30/19 0738  .  prismasol BGK 4/2.5 400 mL/hr at 10/30/19 0738  . sodium chloride 5 mL/hr at 10/31/19 0236  . argatroban 0.1 mcg/kg/min (10/31/19 0236)  . ceFEPime (MAXIPIME) IV    . citrate dextrose    . dexmedetomidine (PRECEDEX) IV infusion Stopped (10/30/19 0703)  . DOBUTamine Stopped (10/30/19 1008)  . doxycycline (VIBRAMYCIN) IV 100 mg (10/31/19 1020)  . feeding supplement (VITAL AF 1.2 CAL) Stopped (10/29/19 1305)  . norepinephrine (LEVOPHED) Adult infusion Stopped (10/29/19 1316)  . phenylephrine (NEO-SYNEPHRINE) Adult infusion    . prismasol BGK 4/2.5 1,000 mL/hr at 10/30/19 1809  . sodium phosphate  Dextrose 5% IVPB 10 mmol (10/31/19 1033)  . vasopressin (PITRESSIN) infusion - *FOR SHOCK* Stopped (10/27/19 2209)   Scheduled Medications: . B-complex with vitamin C  1 tablet Per Tube QHS  . chlorhexidine gluconate (MEDLINE KIT)  15 mL Mouth Rinse BID  . Chlorhexidine Gluconate Cloth  6 each Topical Daily  . [START ON 11/03/2019] epoetin (EPOGEN/PROCRIT) injection  10,000 Units Intravenous Q T,Th,Sa-HD  . hydrocortisone sod succinate (SOLU-CORTEF) inj  50 mg Intravenous Q6H  . insulin aspart  0-9 Units Subcutaneous Q6H  . mouth rinse  15 mL Mouth Rinse QID  . mupirocin ointment  1 application Nasal BID  . pantoprazole sodium  40 mg Per Tube QHS  . patiromer  8.4 g Oral Daily  . sodium chloride flush  10-40 mL Intracatheter Q12H  . vancomycin variable dose per unstable renal function (pharmacist dosing)   Does not apply See admin instructions   PRN  Medications: sodium chloride, acetaminophen **OR** acetaminophen, bisacodyl, citrate dextrose, fentaNYL (SUBLIMAZE) injection, LORazepam, magnesium citrate, ondansetron **OR** ondansetron (ZOFRAN) IV, polyethylene glycol, sodium chloride flush, traMADol Hemodynamic parameters:   Intake/Output: 01/08 0701 - 01/09 0700 In: 1941.7 [P.O.:668; I.V.:83.7; Blood:290; IV [id number]:572] Out: 6203    Other Labs:  LAB RESULTS:  Basic Metabolic Panel: Recent Labs  Lab 10/28/19 0445 10/29/19 0206 10/29/19 0409 10/29/19 1607 10/30/19 0430 10/30/19 1921 10/31/19 0532  NA 132*  --  138 136 136 134* 135  K 4.7  --  4.6 4.4 4.6 4.4 4.6  CL 87*  --  101 103 103 104 103  CO2 30  --  '25 24 24 ' 21* 26  GLUCOSE 125*  --  189* 118* 135* 144* 128*  BUN 72*  --  32* 27* 22* 21* 18  CREATININE 5.80*  --  2.02* 1.61* 1.29* 1.20 1.11  CALCIUM 8.2*  --  8.1* 7.9* 7.6* 7.8* 8.2*  MG 1.8 2.4  --   --  2.2  --  2.3  PHOS 8.3*  --  3.2 2.3* 2.2* 1.6* 1.8*   Liver Function Tests: Recent Labs  Lab 10/27/19 1823 10/28/19 0445 10/29/19 0409 10/29/19 1607 10/30/19 0430 10/30/19 1921 10/31/19 0532  AST 29 37  --   --   --   --   --   ALT 14 19  --   --   --   --   --   ALKPHOS 63 58  --   --   --   --   --   BILITOT 1.9* 2.5*  --   --   --   --   --   PROT 5.0* 4.3*  --   --   --   --   --   ALBUMIN 2.1* 2.0* 2.1* 2.0* 2.1* 2.1* 2.4*   No results for input(s): LIPASE, AMYLASE in the last 168 hours. No results for input(s): AMMONIA in the last 168 hours. CBC: Recent Labs  Lab 10/28/19 0756 10/29/19 0206 10/30/19 0430 10/31/19 0050 10/31/19 0532  WBC 8.7 15.9* 10.9* 12.8* 13.0*  NEUTROABS 7.7  --   --   --   --   HGB 7.3* 8.2* 7.5* 6.5* 8.7*  HCT 22.4* 26.7* 25.2* 22.7* 28.5*  MCV 91.8 94.3 95.8 99.1 94.1  PLT 50* 79* 36* 58* 62*   Cardiac Enzymes: No results for input(s): CKTOTAL, CKMB, CKMBINDEX, TROPONINI in the last 168 hours. BNP: Invalid input(s): POCBNP CBG: Recent Labs  Lab  10/30/19 1811 10/30/19 2311 10/30/19 2342 10/31/19 0614 10/31/19 1148  GLUCAP 146* 164* 153* 126* 125*      ASSESSMENT AND PLAN   Severe ACUTE Hypoxic and Hypercapnic Respiratory Failure Resolved   PNEUMONIA 7 days ABX  ACUTE SYSTOLIC CARDIAC FAILURE- EF 20% -follow up cardiac enzymes as indicated -follow up cardiology recs Plan for cath next week per Dr Clayborn Bigness   ESRD on HD HD AS NEEDED  RT DVT/ISCHEMIC RT HAND Continue anticoagulation Follow up vasc surgery recs   Transfer to [facility] [place] and ICU needs resolved  Corrin Parker, M.D.  [facility]  Medical Director [place] Director [facility] Cardio-Pulmonary Department

## 2019-10-31 NOTE — Progress Notes (Signed)
Pre HD Tx Assessment   10/31/19 1940  Neurological  Level of Consciousness Alert  Orientation Level Oriented to person;Oriented to place;Oriented to situation  Respiratory  Respiratory Pattern Regular;Unlabored  Chest Assessment Chest expansion symmetrical  Bilateral Breath Sounds Fine crackles;Coarse crackles  Cough None  Cardiac  Pulse Regular  Heart Sounds S1, S2  Jugular Venous Distention (JVD) No  Cardiac Rhythm ST  Antiarrhythmic device No  Vascular  R Radial Pulse Doppler  L Radial Pulse Doppler  R Dorsalis Pedis Pulse Doppler  L Dorsalis Pedis Pulse Doppler  R Posterior Tibial Pulse Doppler  L Posterior Tibial Pulse Doppler  Edema Other (Comment);Perineal (R HAND)  Integumentary  Integumentary (WDL) X  Skin Color Pale  Cyanosis Generalized  Skin Condition Dry;Flaky  Skin Integrity Amputation;Ecchymosis  Ecchymosis Location Other (Comment) (SCATTERED )  Ecchymosis Location Orientation Circumferential;Bilateral  Musculoskeletal  Musculoskeletal (WDL) X  Generalized Weakness Yes  Gastrointestinal  Bowel Sounds Assessment Hypoactive  GU Assessment  Genitourinary (WDL) X  Genitourinary Symptoms Anuria (ESRD PT)  Psychosocial  Psychosocial (WDL) X  Patient Behaviors Appropriate for situation;Calm;Cooperative  Needs Expressed Denies  Emotional support given Given to patient

## 2019-10-31 NOTE — Progress Notes (Signed)
HD Tx Started   10/31/19 1945  Vital Signs  Pulse Rate (!) 117  Resp 11  BP (!) 150/109  Oxygen Therapy  SpO2 97 %  O2 Device Nasal Cannula  O2 Flow Rate (L/min) 3 L/min  Pain Assessment  Pain Scale 0-10  Pain Score 0  During Hemodialysis Assessment  Blood Flow Rate (mL/min) 400 mL/min  Arterial Pressure (mmHg) -200 mmHg  Venous Pressure (mmHg) 110 mmHg  Transmembrane Pressure (mmHg) 60 mmHg  Ultrafiltration Rate (mL/min) 490 mL/min  Dialysate Flow Rate (mL/min) 600 ml/min  Conductivity: Machine  14  HD Safety Checks Performed Yes  Dialysis Fluid Bolus Normal Saline  Bolus Amount (mL) 250 mL  Intra-Hemodialysis Comments Tx initiated

## 2019-10-31 NOTE — Progress Notes (Signed)
Post HD Tx Note Pt tolerated well the Tx. Prescribed UF goal of 1L was met. Pt received 717m Loading Dose of Vancomycin in 1539mduring HD Tx. Dose confirmed with pharmacy and primary RN prior to administration. Pt BP continued to be stable during dialysis slightly high but not different from baseline. Pt continues to be on 3LO2 per [place]. Pt reports no chest pain or SOB post Tx. CVC heparin locked closed and clamped    10/31/19 2300  Hand-Off documentation  Report given to (Full Name) CyCaren GriffinsN   Report received from (Full Name) DiNewt MinionN  Vital Signs  Temp 97.9 F (36.6 C)  Temp Source Oral  Pulse Rate (!) 115  Resp (!) 9  BP (!) 139/96  Oxygen Therapy  SpO2 97 %  O2 Device Nasal Cannula  O2 Flow Rate (L/min) 3 L/min  Pulse Oximetry Type Continuous  Pain Assessment  Pain Scale 0-10  Pain Score 0  Post-Hemodialysis Assessment  Rinseback Volume (mL) 250 mL  KECN 65.4 V  Dialyzer Clearance Lightly streaked  Duration of HD Treatment -hour(s) 3 hour(s)  Hemodialysis Intake (mL) 650 mL  UF Total -Machine (mL) 1650 mL  Net UF (mL) 1000 mL  Tolerated HD Treatment Yes  Hemodialysis Catheter Right Internal jugular Double lumen Permanent (Tunneled)  No Placement Date or Time found.   Placed prior to admission: Yes  Orientation: Right  Access Location: Internal jugular  Hemodialysis Catheter Type: Double lumen Permanent (Tunneled)  Site Condition No complications  Blue Lumen Status Heparin locked  Red Lumen Status Heparin locked  Purple Lumen Status N/A  Catheter fill solution Heparin 1000 units/ml  Catheter fill volume (Arterial) 2 cc  Catheter fill volume (Venous) 2.2  Dressing Type Biopatch;Occlusive  Dressing Status Clean;Dry;Intact  Interventions Dressing reinforced  Drainage Description None  Dressing Change Due 11/02/19  Post treatment catheter status Capped and Clamped

## 2019-10-31 NOTE — Progress Notes (Signed)
Patients heart rate consistently tachycardiac since 1500 on 10/31/19. Last hemoglobin check of 7.5 at 0400 on 10/30/19; contacted Dr. Oletta Darter Warren Lacy) to request CBC; hemoglobin resulted in 6.5. One unit PRBC ordered.

## 2019-10-31 NOTE — Progress Notes (Signed)
[place] Progress Note Patient Name: Kyrie Siwicki DOB: [birth year] MRN: [id number]   Date of Service  10/31/2019  HPI/Events of Note  Anemia - Hgb = 6.5.   eICU Interventions  Will transfuse 1 unit PRBC.     Intervention Category Major Interventions: Other:  Bellany Elbaum Cornelia Copa 10/31/2019, 1:11 AM

## 2019-10-31 NOTE — Progress Notes (Signed)
Per Dr. Charlestine Night progress note he would like to start intermittent HD once patient is off dobutamine. Patient has been off since 10 am and is stable; per Dr. Holley Raring stop CRRT at 6AM on 10/31/19.

## 2019-10-31 NOTE — Plan of Care (Signed)
  Problem: Clinical Measurements: Goal: Ability to maintain clinical measurements within normal limits will improve Outcome: Progressing Goal: Will remain free from infection Outcome: Progressing Goal: Diagnostic test results will improve Outcome: Progressing Goal: Respiratory complications will improve Outcome: Progressing Goal: Cardiovascular complication will be avoided Outcome: Progressing  Pt HD Tx should run for 3 hrs on 3K2.5Ca Arterial Line was found not capped and occluded. Alteplase administered twice as per policy. Blood return noted on the second attempt. HD Tx started and progressing

## 2019-10-31 NOTE — Progress Notes (Signed)
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  Patient Measurements: Height: 5\' 1"  (154.9 cm) Weight: 98 lb 1.7 oz (44.5 kg) IBW/kg (Calculated) : 52.3 HEPARIN DW (KG): 46.8  Vital Signs: Temp: 98.2 F (36.8 C) (01/09 0420) Temp Source: Oral (01/09 0420) BP: 135/90 (01/09 0420) Pulse Rate: 115 (01/09 0700)  Labs: Recent Labs    10/29/19 0206 10/29/19 2200 10/30/19 0430 10/30/19 0943 10/30/19 1921 10/31/19 0050 10/31/19 0532  HGB  --   --  7.5*  --   --  6.5* 8.7*  HCT  --   --  25.2*  --   --  22.7* 28.5*  PLT  --   --  36*  --   --  58* 62*  APTT  --  75*  --  76*  --   --  51*  CREATININE   < >  --  1.29*  --  1.20  --  1.11   < > = values in this interval not displayed.   Estimated Creatinine Clearance: 52.9 mL/min (by C-G formula based on SCr of 1.11 mg/dL).  Medical History: Past Medical History:  Diagnosis Date  . ESRD (end stage renal disease) ([place])   . Hypertension   . PAD (peripheral artery disease) ([facility])    Required aortofemoral stent-which had closed and had to redo the procedure and ischemia of limb.  . Peripheral vascular disease ([place])   . Renal disorder   . Secondary hyperparathyroidism of renal origin [facility])    Medications:  Medications Prior to Admission  Medication Sig Dispense Refill Last Dose  . amLODipine (NORVASC) 5 MG tablet Take 1 tablet (5 mg total) by mouth daily. 30 tablet 3 unknown at unknown  . carvedilol (COREG) 25 MG tablet Take 1 tablet (25 mg total) by mouth 2 (two) times daily with a meal. 60 tablet 1 unknown at unknown  . apixaban (ELIQUIS) 2.5 MG TABS tablet Take 1 tablet (2.5 mg total) by mouth 2 (two) times daily. (Patient not taking: Reported on 10/28/2019) 60 tablet 1 Not Taking at Unknown time  . atorvastatin (LIPITOR) 80 MG tablet Take 1 tablet (80 mg total) by mouth at bedtime. (Patient not taking: Reported on 10/28/2019) 30 tablet 1 Not Taking at Unknown time  . b complex-C-folic  acid 1 MG capsule Take 1 capsule by mouth daily after supper.    Not Taking at Unknown time  . carvedilol (COREG) 25 MG tablet Take 1 tablet (25 mg total) by mouth 2 (two) times daily with a meal. (Patient not taking: Reported on 10/28/2019) 60 tablet 0 Not Taking at Unknown time  . gabapentin (NEURONTIN) 100 MG capsule Take 1 capsule (100 mg total) by mouth 3 (three) times daily. (Patient not taking: Reported on 10/28/2019) 90 capsule 1 Not Taking at Unknown time  . hydrALAZINE (APRESOLINE) 25 MG tablet Take 1 tablet (25 mg total) by mouth every 8 (eight) hours. (Patient not taking: Reported on 10/28/2019) 90 tablet 1 Not Taking at Unknown time  . hydrOXYzine (ATARAX/VISTARIL) 25 MG tablet Take 25 mg by mouth 3 (three) times daily as needed for anxiety.   Not Taking at Unknown time  . levETIRAcetam (KEPPRA) 500 MG tablet Take 1 tablet (500 mg total) by mouth 2 (two) times daily. (Patient not taking: Reported on 10/28/2019) 60 tablet 0 Not Taking at Unknown time  . loratadine (CLARITIN) 10 MG tablet Take 1 tablet (10 mg total) by mouth daily. (Patient not taking: Reported on 10/28/2019) 30  tablet 0 Not Taking at Unknown time  . multivitamin (RENA-VIT) TABS tablet Take 1 tablet by mouth daily. (Patient not taking: Reported on 10/28/2019) 30 each 1 Not Taking at Unknown time  . neomycin-bacitracin-polymyxin (NEOSPORIN) OINT Apply 1 application topically 2 (two) times daily. (Patient not taking: Reported on 10/28/2019) 56 g 1 Not Taking at Unknown time  . Nutritional Supplements (FEEDING SUPPLEMENT, NEPRO CARB STEADY,) LIQD Take 237 mLs by mouth 2 (two) times daily between meals. (Patient not taking: Reported on 10/28/2019) 237 mL 30 Not Taking at Unknown time  . sevelamer carbonate (RENVELA) 800 MG tablet Take 2 tablets (1,600 mg total) by mouth 3 (three) times daily with meals. 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  Patient's is in critical status with multiorgan failure, poor prognosis, and he has high risk for subsequent cardiac arrest. H&H trending down, platelets remain very low after a small rebound  Per [place] Anticoagulation Protocol, 4T score = 4 ("Possible HIT"). HIT antibody negative, SRA pending  Goal of Therapy:  Monitor platelets by anticoagulation protocol: Yes  Target aPTT 50-90 seconds  Course of Therapy Argatroban initiated at 0.5 mcg/kg/min for ICU patients per protocol. 0106 1137 aPTT 113s: dec infusion to 0.35 mcgkg/min 0106 1443 aPTT 107s: dec infusion to 0.30 mcg/kg/min 0106 2206 aPTT 92s: no change 0107 0206 aPTT 110s: dec infusion to 0.20 mcg/kg/min 0107 0726 aPTT 108s: dec infusion to 0.15 mcg/kg/min 0107 1241 aPTT 94s: dec infusion to 0.10 mcg/kg/min 0107 1815 aPTT 88s: continue infusion at 0.10 mcg/kg/min 0107 2200 aPTT 75s: continue infusion at 0.70mcg/kg/min 0108 0943 aPTT 76s: continue infusion at 0.35mcg/kg/min 0109 0532 aPTT 51s: continue infusion at 0.10 mcg/kg/min   Plan:  Continue argatraban at 0.10 mcg/kg/min. Although level borderline therapeutic, will defer adjusting rate at this time. CRRT discontinued. Although not known to interfere with clearance, will check level in 12 hours to ensure patient remains therapeutic.   Tawnya Crook, PharmD 10/31/2019 8:56 AM

## 2019-10-31 NOTE — Progress Notes (Signed)
Pharmacy Antibiotic Note  Brett Small is a 44 y.o. male admitted on 10/27/2019.  Patient has past medical history of ESRD, hypertension, and peripheral artery disease.  Pharmacy has been consulted for vancomycin and cefepime dosing for sepsis.  CXR suggests pneumonia, and patient also has acute respiratory failure.  On 1/5 patient experienced cardiac arrest x2.  Patient is on HD TTS, but has been changed to CRRT inpatient due to hemodynamic instability. A random level was drawn this am prior to the dose which showed a therapeutic level. Stop dates have been placed for 7 days of therapy on cefepime, doxycycline and vancomycin  Plan: Patient transitioned off of CRRT this morning with plan for HD today. Vanc random level slightly below goal pre-HD level of 15-25. Will give 750 mg IV x 1 today and then continue with 500 mg IV qHD TTS. Current plan is for 7 days of antibiotics so likely can discontinue after dose on Tuesday.  Will change cefepime to 1 g IV q24h for dialysis   Pharmacy will follow and adjust based on nephrology plan.  Height: 5\' 1"  (154.9 cm) Weight: 98 lb 1.7 oz (44.5 kg) IBW/kg (Calculated) : 52.3  Temp (24hrs), Avg:97.8 F (36.6 C), Min:97.1 F (36.2 C), Max:98.4 F (36.9 C)  Recent Labs  Lab 10/27/19 1307 10/28/19 0045 10/28/19 0301 10/28/19 0756 10/29/19 0206 10/29/19 0409 10/29/19 1607 10/30/19 0430 10/30/19 0943 10/30/19 1921 10/31/19 0050 10/31/19 0532 10/31/19 1331  WBC 13.3*  --   --  8.7 15.9*  --   --  10.9*  --   --  12.8* 13.0*  --   CREATININE 6.34*  --   --   --   --  2.02* 1.61* 1.29*  --  1.20  --  1.11  --   LATICACIDVEN >11.0*  7.5* 5.6* 3.2*  --   --   --   --   --   --   --   --   --   --   VANCORANDOM  --   --   --   --   --   --   --   --  11  --   --   --  13    Estimated Creatinine Clearance: 52.9 mL/min (by C-G formula based on SCr of 1.11 mg/dL).    Antimicrobials this admission: Azithromycin 1/5 >> 1/6 Vanc 1/5 >>  Cefepime 1/5 >>   Doxycycline 1/6 >>  Microbiology results: 1/5 BCx: NGTD 1/5 MRSA: positive  Thank you for allowing pharmacy to be a part of this patient's care.  Tawnya Crook, PharmD 10/31/2019 2:30 PM

## 2019-10-31 NOTE — Progress Notes (Signed)
Post HD Tx Assessment   10/31/19 2300  Neurological  Level of Consciousness Alert  Orientation Level Oriented to person;Oriented to place;Oriented to situation;Oriented to time  Respiratory  Respiratory Pattern Regular;Unlabored  Chest Assessment Chest expansion symmetrical  Bilateral Breath Sounds Clear;Fine crackles  R Upper  Breath Sounds Clear  L Upper Breath Sounds Clear  R Lower Breath Sounds Fine crackles  L Lower Breath Sounds Fine crackles  Cough None  Cardiac  Pulse Regular  Heart Sounds S1, S2  Jugular Venous Distention (JVD) No  Cardiac Rhythm ST  Antiarrhythmic device No  Vascular  R Radial Pulse Doppler  L Radial Pulse Doppler  R Dorsalis Pedis Pulse Doppler  L Dorsalis Pedis Pulse Doppler  R Posterior Tibial Pulse Doppler  L Posterior Tibial Pulse Doppler  Edema Other (Comment);Perineal (R HAND)  Integumentary  Integumentary (WDL) X  Skin Color Pale  Cyanosis Generalized  Skin Condition Dry;Flaky  Skin Integrity Amputation;Ecchymosis  Ecchymosis Location Other (Comment) (SCATTERED )  Ecchymosis Location Orientation Circumferential;Bilateral  Musculoskeletal  Musculoskeletal (WDL) X  Generalized Weakness Yes  Gastrointestinal  Bowel Sounds Assessment Hypoactive  GU Assessment  Genitourinary (WDL) X  Genitourinary Symptoms Anuria (ESRD PT)  Psychosocial  Psychosocial (WDL) X  Patient Behaviors Appropriate for situation;Calm;Cooperative  Needs Expressed Denies  Emotional support given Given to patient

## 2019-10-31 NOTE — Progress Notes (Signed)
HD Tx Completed   10/31/19 2255  Vital Signs  Pulse Rate (!) 111  Resp (!) 9  BP (!) 146/100  Oxygen Therapy  SpO2 97 %  O2 Device Nasal Cannula  O2 Flow Rate (L/min) 3 L/min  Pulse Oximetry Type Continuous  Pain Assessment  Pain Scale 0-10  Pain Score 0  During Hemodialysis Assessment  Blood Flow Rate (mL/min) 400 mL/min  Arterial Pressure (mmHg) -140 mmHg  Venous Pressure (mmHg) 140 mmHg  Transmembrane Pressure (mmHg) 60 mmHg  Ultrafiltration Rate (mL/min) 660 mL/min  Dialysate Flow Rate (mL/min) 600 ml/min  Conductivity: Machine  13.4  HD Safety Checks Performed Yes  Intra-Hemodialysis Comments Tx completed;Tolerated well

## 2019-11-01 DIAGNOSIS — R000 Tachycardia, unspecified: Secondary | ICD-10-CM

## 2019-11-01 DIAGNOSIS — I82621 Acute embolism and thrombosis of deep veins of right upper extremity: Secondary | ICD-10-CM

## 2019-11-01 LAB — TYPE AND SCREEN
ABO/RH(D): AB POS
Antibody Screen: NEGATIVE
Unit division: 0
Unit division: 0

## 2019-11-01 LAB — CBC
HCT: 30 % — ABNORMAL LOW (ref 39.0–52.0)
Hemoglobin: 9.2 g/dL — ABNORMAL LOW (ref 13.0–17.0)
MCH: 29.1 pg (ref 26.0–34.0)
MCHC: 30.7 g/dL (ref 30.0–36.0)
MCV: 94.9 fL (ref 80.0–100.0)
Platelets: 100 10*3/uL — ABNORMAL LOW (ref 150–400)
RBC: 3.16 MIL/uL — ABNORMAL LOW (ref 4.22–5.81)
RDW: 21.6 % — ABNORMAL HIGH (ref 11.5–15.5)
WBC: 18.1 10*3/uL — ABNORMAL HIGH (ref 4.0–10.5)
nRBC: 0 % (ref 0.0–0.2)

## 2019-11-01 LAB — BPAM RBC
Blood Product Expiration Date: 202101162359
Blood Product Expiration Date: 202101262359
ISSUE DATE / TIME: 202101071119
ISSUE DATE / TIME: 202101090146
Unit Type and Rh: 1700
Unit Type and Rh: 7300

## 2019-11-01 LAB — RENAL FUNCTION PANEL
Albumin: 2.5 g/dL — ABNORMAL LOW (ref 3.5–5.0)
Anion gap: 11 (ref 5–15)
BUN: 14 mg/dL (ref 6–20)
CO2: 25 mmol/L (ref 22–32)
Calcium: 8.6 mg/dL — ABNORMAL LOW (ref 8.9–10.3)
Chloride: 102 mmol/L (ref 98–111)
Creatinine, Ser: 1.34 mg/dL — ABNORMAL HIGH (ref 0.61–1.24)
GFR calc Af Amer: >60 mL/min (ref >60)
GFR calc non Af Amer: >60 mL/min (ref >60)
Glucose, Bld: 110 mg/dL — ABNORMAL HIGH (ref 70–99)
Phosphorus: 2.4 mg/dL — ABNORMAL LOW (ref 2.5–4.6)
Potassium: 4 mmol/L (ref 3.5–5.1)
Sodium: 138 mmol/L (ref 135–145)

## 2019-11-01 LAB — GLUCOSE, CAPILLARY
Glucose-Capillary: 114 mg/dL — ABNORMAL HIGH (ref 70–99)
Glucose-Capillary: 122 mg/dL — ABNORMAL HIGH (ref 70–99)
Glucose-Capillary: 125 mg/dL — ABNORMAL HIGH (ref 70–99)
Glucose-Capillary: 134 mg/dL — ABNORMAL HIGH (ref 70–99)
Glucose-Capillary: 165 mg/dL — ABNORMAL HIGH (ref 70–99)

## 2019-11-01 LAB — APTT: aPTT: 62 seconds — ABNORMAL HIGH (ref 24–36)

## 2019-11-01 LAB — CULTURE, BLOOD (ROUTINE X 2): Culture: NO GROWTH

## 2019-11-01 LAB — MAGNESIUM: Magnesium: 2 mg/dL (ref 1.7–2.4)

## 2019-11-01 MED ORDER — CARVEDILOL 25 MG PO TABS
25.0000 mg | ORAL_TABLET | Freq: Two times a day (BID) | ORAL | Status: DC
  Administered 2019-11-01 – 2019-11-04 (×4): 25 mg via ORAL
  Filled 2019-11-01 (×4): qty 1

## 2019-11-01 NOTE — Progress Notes (Signed)
Pharmacy Antibiotic Note  Brett Small is a 44 y.o. male admitted on 10/27/2019.  Patient has past medical history of ESRD, hypertension, and peripheral artery disease.  Pharmacy has been consulted for vancomycin and cefepime dosing for sepsis.  CXR suggests pneumonia, and patient also has acute respiratory failure.  On 1/5 patient experienced cardiac arrest x2.  Patient is on HD TTS, but has been changed to CRRT inpatient due to hemodynamic instability. A random level was drawn this am prior to the dose which showed a therapeutic level. Stop dates have been placed for 7 days of therapy on cefepime, doxycycline and vancomycin  Plan: Patient transitioned off of CRRT and received HD yesterday. Vanc 750 mg IV x 1 yesterday and then continue with 500 mg IV x 1 Tuesday to finish 7 days of total therapy.  Will change cefepime to 1 g IV q24h for dialysis   Pharmacy will follow and adjust based on nephrology plan. Blood culture showing clostridium species in one bottle, likely contamination. Continue to follow patient clinically.  Height: 5\' 1"  (154.9 cm) Weight: 98 lb 1.7 oz (44.5 kg) IBW/kg (Calculated) : 52.3  Temp (24hrs), Avg:98.2 F (36.8 C), Min:97.7 F (36.5 C), Max:98.8 F (37.1 C)  Recent Labs  Lab 10/27/19 1307 10/28/19 0045 10/28/19 0301 10/29/19 0206 10/30/19 0430 10/30/19 0943 10/30/19 1921 10/31/19 0050 10/31/19 0532 10/31/19 1331 10/31/19 1609 11/01/19 0407  WBC 13.3*  --   --  15.9* 10.9*  --   --  12.8* 13.0*  --   --  18.1*  CREATININE 6.34*  --   --   --  1.29*  --  1.20  --  1.11  --  1.83* 1.34*  LATICACIDVEN >11.0*  7.5* 5.6* 3.2*  --   --   --   --   --   --   --   --   --   VANCORANDOM  --   --   --   --   --  11  --   --   --  13  --   --     Estimated Creatinine Clearance: 43.8 mL/min (A) (by C-G formula based on SCr of 1.34 mg/dL (H)).    Antimicrobials this admission: Azithromycin 1/5 >> 1/6 Vanc 1/5 >>  Cefepime 1/5 >>  Doxycycline 1/6  >>  Microbiology results: 1/5 BCx: 1 bottle clostridium species 1/5 MRSA: positive  Thank you for allowing pharmacy to be a part of this patient's care.  Tawnya Crook, PharmD 11/01/2019 7:51 AM

## 2019-11-01 NOTE — Progress Notes (Signed)
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  Medical History: Past Medical History:  Diagnosis Date  . ESRD (end stage renal disease) ([place])   . Hypertension   . PAD (peripheral artery disease) ([facility])    Required aortofemoral stent-which had closed and had to redo the procedure and ischemia of limb.  . Peripheral vascular disease ([place])   . Renal disorder   . Secondary hyperparathyroidism of renal origin [facility])    Medications:  Medications Prior to Admission  Medication Sig Dispense Refill Last Dose  . amLODipine (NORVASC) 5 MG tablet Take 1 tablet (5 mg total) by mouth daily. 30 tablet 3 unknown at unknown  . carvedilol (COREG) 25 MG tablet Take 1 tablet (25 mg total) by mouth 2 (two) times daily with a meal. 60 tablet 1 unknown at unknown  . apixaban (ELIQUIS) 2.5 MG TABS tablet Take 1 tablet (2.5 mg total) by mouth 2 (two) times daily. (Patient not taking: Reported on 10/28/2019) 60 tablet 1 Not Taking at Unknown time  . atorvastatin (LIPITOR) 80 MG tablet Take 1 tablet (80 mg total) by mouth at bedtime. (Patient not taking: Reported on 10/28/2019) 30 tablet 1 Not Taking at Unknown time  . b complex-C-folic acid 1 MG capsule Take 1 capsule by mouth daily after supper.    Not Taking at Unknown time  . carvedilol (COREG) 25 MG tablet Take 1 tablet (25 mg total) by  mouth 2 (two) times daily with a meal. (Patient not taking: Reported on 10/28/2019) 60 tablet 0 Not Taking at Unknown time  . gabapentin (NEURONTIN) 100 MG capsule Take 1 capsule (100 mg total) by mouth 3 (three) times daily. (Patient not taking: Reported on 10/28/2019) 90 capsule 1 Not Taking at Unknown time  . hydrALAZINE (APRESOLINE) 25 MG tablet Take 1 tablet (25 mg total) by mouth every 8 (eight) hours. (Patient not taking: Reported on 10/28/2019) 90 tablet 1 Not Taking at Unknown time  . hydrOXYzine (ATARAX/VISTARIL) 25 MG tablet Take 25 mg by mouth 3 (three) times daily as needed for anxiety.   Not Taking at Unknown time  . levETIRAcetam (KEPPRA) 500 MG tablet Take 1 tablet (500 mg total) by mouth 2 (two) times daily. (Patient not taking: Reported on 10/28/2019) 60 tablet 0 Not Taking at Unknown time  . loratadine (CLARITIN) 10 MG tablet Take 1 tablet (10 mg total) by mouth daily. (Patient not taking: Reported on 10/28/2019) 30 tablet 0 Not Taking at Unknown time  . multivitamin (RENA-VIT) TABS tablet Take 1 tablet by mouth daily. (Patient not taking: Reported on 10/28/2019) 30 each 1 Not Taking at Unknown time  . neomycin-bacitracin-polymyxin (NEOSPORIN) OINT Apply 1 application topically 2 (two) times daily. (Patient not taking: Reported on 10/28/2019) 56 g 1 Not Taking at Unknown time  .  Nutritional Supplements (FEEDING SUPPLEMENT, NEPRO CARB STEADY,) LIQD Take 237 mLs by mouth 2 (two) times daily between meals. (Patient not taking: Reported on 10/28/2019) 237 mL 30 Not Taking at Unknown time  . sevelamer carbonate (RENVELA) 800 MG tablet Take 2 tablets (1,600 mg total) by mouth 3 (three) times daily with meals. 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  Patient's is in critical status with multiorgan failure, poor prognosis, and he has high risk for subsequent cardiac arrest. H&H  trending down, platelets remain very low after a small rebound  Per [place] Anticoagulation Protocol, 4T score = 4 ("Possible HIT"). HIT antibody negative, SRA pending  Goal of Therapy:  Monitor platelets by anticoagulation protocol: Yes  Target aPTT 50-90 seconds  Course of Therapy Argatroban initiated at 0.5 mcg/kg/min for ICU patients per protocol. 0106 1137 aPTT 113s: dec infusion to 0.35 mcgkg/min 0106 1443 aPTT 107s: dec infusion to 0.30 mcg/kg/min 0106 2206 aPTT 92s: no change 0107 0206 aPTT 110s: dec infusion to 0.20 mcg/kg/min 0107 0726 aPTT 108s: dec infusion to 0.15 mcg/kg/min 0107 1241 aPTT 94s: dec infusion to 0.10 mcg/kg/min 0107 1815 aPTT 88s: continue infusion at 0.10 mcg/kg/min 0107 2200 aPTT 75s: continue infusion at 0.43mcg/kg/min 0108 0943 aPTT 76s: continue infusion at 0.59mcg/kg/min 0109 0532 aPTT 51s: continue infusion at 0.10 mcg/kg/min  0109 1609 aPTT 60s: continue infusion at 0.10 mcg/kg/min 0110 0407 aPTT 62s:  Plan:  Continue argatraban at 0.10 mcg/kg/min.  CRRT discontinued.   Will recheck aPTT with am labs  Paulina Fusi, PharmD, BCPS 11/01/2019 5:16 AM

## 2019-11-01 NOTE — Progress Notes (Signed)
PROGRESS NOTE                                                                                                                                                                                                             Patient Demographics:    Brett Small, is a 44 y.o. male, DOB - [birth year], JIZ:[id number]  Admit date - 10/27/2019   Admitting Physician Ezekiel Slocumb, DO  Outpatient Primary MD for the patient is Patient, [name]  LOS - 5  Outpatient Specialists: renal  Chief Complaint  Patient presents with  . Emesis       Brief Narrative  44 year old male with ESRD on dialysis, chronic systolic CHF with EF <73% presented to the ED on 10/26/2018 with chest pain, shortness of breath and vomiting.  He was found to have sepsis secondary to pneumonia, metabolic acidosis and NSTEMI.  He had PEA in the ED and CODE BLUE was called.  Patient received CPR and given 2 rounds of epinephrine and calcium gluconate after which ROSC was obtained.  Patient intubated and transferred to ED.  Successfully extubated on 1/8.   Patient also developed DVT of the right radial vein. Transferred to hospital service on 1/9.   Subjective:   Complains of pain in his right hand.  Tachycardic to 120s.   Assessment  & Plan :    Principal Problem: Sepsis due to pneumonia ([place]) Empiric antibiotic for 7 days.  (IV vancomycin and cefepime).  cultures so far negative.  Active problems Acute respiratory failure with hypoxia and hypercapnia. Intubated and admitted to ICU.  Extubated on 1/8 and currently maintaining sats on 2 L via nasal cannula.   NSTEMI (non-ST elevated myocardial infarction) ([facility]) Has severe cardiomyopathy.  On argatroban drip.  Cardiology plans on cardiac cath this week.  Coreg resumed given tachycardia.  Right radial vein DVT with thrombocytopenia On argatroban.  HIT antibody negative.     End stage renal disease on  dialysis (TTS) (HCC) Clotted permacath, TPA given on 1/9.  He has left AV fistula (not matured for dialysis yet)   Hypertension   Depression      Chronic systolic CHF (congestive heart failure) ([place]) Euvolemic.  Volume management with dialysis.  Generalized weakness PT evaluation.  Nutrition consult.  Anemia of chronic kidney disease Associated thrombocytopenia.  Platelets currently stable.  Improved with HD.  Essential hypertension Required pressors for sepsis with hypotension.  Amlodipine and hydralazine on hold.  Coreg resumed due to tachycardia.    Code Status : Full code  Family Communication  : None  Disposition Plan  : Undetermined with a home versus SNF) pending clinical improvement, cardiac cath  Barriers For Discharge : Active symptoms  Consults  : Critical care, cardiology, renal  Procedures  : Intubation, Doppler right upper extremity  DVT Prophylaxis  : IV argatroban  Lab Results  Component Value Date   PLT 100 (L) 11/01/2019    Antibiotics  :    Anti-infectives (From admission, onward)   Start     Dose/Rate Route Frequency Ordered Stop   11/03/19 1200  vancomycin (VANCOREADY) IVPB 500 mg/100 mL  Status:  Discontinued     500 mg 100 mL/hr over 60 Minutes Intravenous Every T-Th-Sa (Hemodialysis) 10/31/19 1430 10/31/19 1434   11/03/19 1200  vancomycin (VANCOREADY) IVPB 500 mg/100 mL     500 mg 100 mL/hr over 60 Minutes Intravenous Every T-Th-Sa (Hemodialysis) 10/31/19 1434 11/05/19 1159   10/31/19 2000  ceFEPIme (MAXIPIME) 1 g in sodium chloride 0.9 % 100 mL IVPB     1 g 200 mL/hr over 30 Minutes Intravenous Every 24 hours 10/31/19 0847 11/04/19 1959   10/31/19 1445  vancomycin (VANCOREADY) IVPB 750 mg/150 mL     750 mg 150 mL/hr over 60 Minutes Intravenous  Once 10/31/19 1430 10/31/19 2306   10/31/19 0847  vancomycin variable dose per unstable renal function (pharmacist dosing)      Does not apply See admin instructions 10/31/19 0847     10/30/19  2200  doxycycline (VIBRAMYCIN) 100 mg in sodium chloride 0.9 % 250 mL IVPB     100 mg 125 mL/hr over 120 Minutes Intravenous Every 12 hours 10/30/19 1241 11/04/19 2159   10/29/19 1200  vancomycin (VANCOREADY) IVPB 500 mg/100 mL  Status:  Discontinued     500 mg 100 mL/hr over 60 Minutes Intravenous Every T-Th-Sa (Hemodialysis) 10/27/19 1531 10/28/19 0846   10/28/19 2200  doxycycline (VIBRAMYCIN) 200 mg in dextrose 5 % 250 mL IVPB  Status:  Discontinued     200 mg 125 mL/hr over 120 Minutes Intravenous Every 12 hours 10/28/19 1057 10/30/19 1241   10/28/19 1800  ceFEPIme (MAXIPIME) 1 g in sodium chloride 0.9 % 100 mL IVPB  Status:  Discontinued     1 g 200 mL/hr over 30 Minutes Intravenous Every 24 hours 10/27/19 1531 10/28/19 0846   10/28/19 1200  vancomycin (VANCOREADY) IVPB 500 mg/100 mL  Status:  Discontinued     500 mg 100 mL/hr over 60 Minutes Intravenous Every 24 hours 10/28/19 0846 10/31/19 0847   10/28/19 1000  ceFEPIme (MAXIPIME) 2 g in sodium chloride 0.9 % 100 mL IVPB  Status:  Discontinued     2 g 200 mL/hr over 30 Minutes Intravenous Every 12 hours 10/28/19 0846 10/31/19 0847   10/27/19 1600  azithromycin (ZITHROMAX) 500 mg in sodium chloride 0.9 % 250 mL IVPB  Status:  Discontinued     500 mg 250 mL/hr over 60 Minutes Intravenous Every 24 hours 10/27/19 1518 10/28/19 1053   10/27/19 1530  ceFEPIme (MAXIPIME) 2 g in sodium chloride 0.9 % 100 mL IVPB  Status:  Discontinued     2 g 200 mL/hr over 30 Minutes Intravenous  Once 10/27/19 1518 10/27/19 1525   10/27/19 1515  vancomycin (VANCOCIN) IVPB 1000 mg/200 mL premix  Status:  Discontinued  Note to Pharmacy: Please adjust start times accordingly as patient got broad spectrum antibiotics in ED.   1,000 mg 200 mL/hr over 60 Minutes Intravenous  Once 10/27/19 1518 10/27/19 1524   10/27/19 1245  ceFEPIme (MAXIPIME) 2 g in sodium chloride 0.9 % 100 mL IVPB     2 g 200 mL/hr over 30 Minutes Intravenous  Once 10/27/19 1240 10/27/19  1415   10/27/19 1245  metroNIDAZOLE (FLAGYL) IVPB 500 mg     500 mg 100 mL/hr over 60 Minutes Intravenous  Once 10/27/19 1240 10/27/19 1620   10/27/19 1245  vancomycin (VANCOCIN) IVPB 1000 mg/200 mL premix     1,000 mg 200 mL/hr over 60 Minutes Intravenous  Once 10/27/19 1240 10/27/19 1620        Objective:   Vitals:   11/01/19 0600 11/01/19 0800 11/01/19 0900 11/01/19 1000  BP: (!) 163/108 (!) 155/111  (!) 156/144  Pulse: (!) 117 (!) 120 (!) 135 (!) 123  Resp: 11 (!) '9 12 14  ' Temp:  98.6 F (37 C)    TempSrc:  Oral    SpO2: 96% 99% 96% 100%  Weight:      Height:        Wt Readings from Last 3 Encounters:  11/01/19 44.5 kg  10/07/19 46.8 kg  09/21/19 44.8 kg     Intake/Output Summary (Last 24 hours) at 11/01/2019 1142 Last data filed at 11/01/2019 1000 Gross per 24 hour  Intake 691.79 ml  Output 1100 ml  Net -408.21 ml     Physical Exam  Gen: Middle-aged male, fatigued, not in distress HEENT: Pallor present moist mucosa, supple neck Chest: Fine left basilar crackles, right-sided PermCath, CVS: S1 and S2 tachycardic, 3/6 systolic murmur GI: soft, NT, ND, BS+ Musculoskeletal: warm, swelling of the right hand and forearm,  left upper extremity fistula     Data Review:    CBC Recent Labs  Lab 10/28/19 0756 10/29/19 0206 10/30/19 0430 10/31/19 0050 10/31/19 0532 11/01/19 0407  WBC 8.7 15.9* 10.9* 12.8* 13.0* 18.1*  HGB 7.3* 8.2* 7.5* 6.5* 8.7* 9.2*  HCT 22.4* 26.7* 25.2* 22.7* 28.5* 30.0*  PLT 50* 79* 36* 58* 62* 100*  MCV 91.8 94.3 95.8 99.1 94.1 94.9  MCH 29.9 29.0 28.5 28.4 28.7 29.1  MCHC 32.6 30.7 29.8* 28.6* 30.5 30.7  RDW 21.3* 21.0* 21.3* 21.4* 21.1* 21.6*  LYMPHSABS 0.5*  --   --   --   --   --   MONOABS 0.4  --   --   --   --   --   EOSABS 0.0  --   --   --   --   --   BASOSABS 0.0  --   --   --   --   --     Chemistries  Recent Labs  Lab 10/27/19 1823 10/28/19 0445 10/29/19 0206 10/30/19 0430 10/30/19 1921 10/31/19 0532  10/31/19 1609 11/01/19 0407  NA 131* 132*  --  136 134* 135 136 138  K 4.8 4.7  --  4.6 4.4 4.6 4.5 4.0  CL 99 87*  --  103 104 103 102 102  CO2 11* 30  --  24 21* '26 22 25  ' GLUCOSE 104* 125*  --  135* 144* 128* 154* 110*  BUN 60* 72*  --  22* 21* 18 25* 14  CREATININE 5.18* 5.80*  --  1.29* 1.20 1.11 1.83* 1.34*  CALCIUM >15.0* 8.2*  --  7.6* 7.8* 8.2* 8.6* 8.6*  MG  --  1.8 2.4 2.2  --  2.3  --  2.0  AST 29 37  --   --   --   --   --   --   ALT 14 19  --   --   --   --   --   --   ALKPHOS 63 58  --   --   --   --   --   --   BILITOT 1.9* 2.5*  --   --   --   --   --   --    ------------------------------------------------------------------------------------------------------------------ No results for input(s): CHOL, HDL, LDLCALC, TRIG, CHOLHDL, LDLDIRECT in the last 72 hours.  Lab Results  Component Value Date   HGBA1C 4.7 (L) 07/09/2019   ------------------------------------------------------------------------------------------------------------------ No results for input(s): TSH, T4TOTAL, T3FREE, THYROIDAB in the last 72 hours.  Invalid input(s): FREET3 ------------------------------------------------------------------------------------------------------------------ No results for input(s): VITAMINB12, FOLATE, FERRITIN, TIBC, IRON, RETICCTPCT in the last 72 hours.  Coagulation profile Recent Labs  Lab 10/27/19 1449 10/28/19 0445 10/28/19 0842  INR 1.9* 2.4* 1.9*    No results for input(s): DDIMER in the last 72 hours.  Cardiac Enzymes No results for input(s): CKMB, TROPONINI, MYOGLOBIN in the last 168 hours.  Invalid input(s): CK ------------------------------------------------------------------------------------------------------------------    Component Value Date/Time   BNP 3,772.0 (H) 08/05/2019 0431    Inpatient Medications  Scheduled Meds: . B-complex with vitamin C  1 tablet Per Tube QHS  . carvedilol  25 mg Oral BID WC  . chlorhexidine gluconate  (MEDLINE KIT)  15 mL Mouth Rinse BID  . Chlorhexidine Gluconate Cloth  6 each Topical Daily  . [START ON 11/03/2019] epoetin (EPOGEN/PROCRIT) injection  10,000 Units Intravenous Q T,Th,Sa-HD  . hydrocortisone sod succinate (SOLU-CORTEF) inj  50 mg Intravenous Q6H  . insulin aspart  0-9 Units Subcutaneous Q6H  . mouth rinse  15 mL Mouth Rinse QID  . mupirocin ointment  1 application Nasal BID  . pantoprazole sodium  40 mg Per Tube QHS  . sodium chloride flush  10-40 mL Intracatheter Q12H  . vancomycin variable dose per unstable renal function (pharmacist dosing)   Does not apply See admin instructions   Continuous Infusions: . sodium chloride Stopped (11/01/19 0138)  . argatroban 0.1 mcg/kg/min (11/01/19 1000)  . ceFEPime (MAXIPIME) IV Stopped (10/31/19 2005)  . citrate dextrose    . doxycycline (VIBRAMYCIN) IV Stopped (11/01/19 0948)  . feeding supplement (VITAL AF 1.2 CAL) Stopped (10/29/19 1305)  . [START ON 11/03/2019] vancomycin     PRN Meds:.sodium chloride, acetaminophen **OR** acetaminophen, bisacodyl, citrate dextrose, fentaNYL (SUBLIMAZE) injection, hydrALAZINE, LORazepam, magnesium citrate, ondansetron **OR** ondansetron (ZOFRAN) IV, polyethylene glycol, sodium chloride flush, traMADol  Micro Results Recent Results (from the past 240 hour(s))  Blood Culture (routine x 2)     Status: Abnormal   Collection Time: 10/27/19  1:07 PM   Specimen: BLOOD  Result Value Ref Range Status   Specimen Description   Final    BLOOD BLOOD RIGHT HAND Performed at [facility], [address] [place], [place] [address]    Special Requests   Final    BOTTLES DRAWN AEROBIC AND ANAEROBIC Blood Culture results may not be optimal due to an inadequate volume of blood received in culture bottles Performed at [facility], [address] [place], [place] [address]    Culture  Setup Time   Final    GRAM POSITIVE RODS AEROBIC BOTTLE ONLY CRITICAL RESULT CALLED TO,  READ BACK BY  AND VERIFIED WITH: SCOTT HALL 10/28/19 @ 8  [place] Performed at [facility], [address] [place], [place] [address]    Culture CLOSTRIDIUM SPECIES (A)  Final   Report Status 10/31/2019 FINAL  Final  Blood Culture (routine x 2)     Status: None   Collection Time: 10/27/19  1:07 PM   Specimen: BLOOD  Result Value Ref Range Status   Specimen Description BLOOD BLOOD LEFT HAND  Final   Special Requests   Final    BOTTLES DRAWN AEROBIC ONLY Blood Culture results may not be optimal due to an inadequate volume of blood received in culture bottles   Culture   Final    NO GROWTH 5 DAYS Performed at [facility], [address] [place], [place] [address]    Report Status 11/01/2019 FINAL  Final  SARS CORONAVIRUS 2 (TAT 6-24 HRS) Nasopharyngeal Nasopharyngeal Swab     Status: None   Collection Time: 10/27/19  3:54 PM   Specimen: Nasopharyngeal Swab  Result Value Ref Range Status   SARS Coronavirus 2 NEGATIVE NEGATIVE Final    Comment: (NOTE) SARS-CoV-2 target nucleic acids are NOT DETECTED. The SARS-CoV-2 RNA is generally detectable in upper and lower respiratory specimens during the acute phase of infection. Negative results do not preclude SARS-CoV-2 infection, do not rule out co-infections with other pathogens, and should not be used as the sole basis for treatment or other patient management decisions. Negative results must be combined with clinical observations, patient history, and epidemiological information. The expected result is Negative. Fact Sheet for Patients: SugarRoll.be Fact Sheet for Healthcare Providers: https://www.woods-mathews.com/ This test is not yet approved or cleared by the [place] FDA and  has been authorized for detection and/or diagnosis of SARS-CoV-2 by FDA under an Emergency Use Authorization (EUA). This EUA will remain  in effect (meaning this test can be used) for the duration of  the COVID-19 declaration under Section 56 4(b)(1) of the Act, 21 U.S.C. section 360bbb-3(b)(1), unless the authorization is terminated or revoked sooner. Performed at [facility], [place] [address] [place], [place] [address]   MRSA PCR Screening     Status: Abnormal   Collection Time: 10/27/19  9:52 PM   Specimen: Nasopharyngeal  Result Value Ref Range Status   MRSA by PCR POSITIVE (A) NEGATIVE Final    Comment:        The GeneXpert MRSA Assay (FDA approved for NASAL specimens only), is one component of a comprehensive MRSA colonization surveillance program. It is not intended to diagnose MRSA infection nor to guide or monitor treatment for MRSA infections. RESULT CALLED TO, READ BACK BY AND VERIFIED WITH: Shary Key RN [phone] 10/27/19 HNM Performed at [facility], [address], [place], [place] [address]     Radiology Reports EEG  Result Date: 10/28/2019 Lora Havens, MD     10/28/2019  3:18 PM Patient Name: Brett Small MRN: [id number] Epilepsy Attending: Lora Havens Referring Physician/Provider: Darel Hong, NP Date: 10/27/2018 Duration: 27.57 minutes Patient history: 44 year old male with history of seizure disorder now status post cardiac arrest.  EEG to evaluate for seizures Level of alertness: Comatose/sedated AEDs during EEG study: None Technical aspects: This EEG study was done with scalp electrodes positioned according to the 10-20 International system of electrode placement. Electrical activity was acquired at a sampling rate of '500Hz'  and reviewed with a high frequency filter of '70Hz'  and a low frequency filter of '1Hz' . EEG  data were recorded continuously and digitally stored. Description: EEG showed continuous generalized, maximal bitemporal 3 to 6 Hz theta-delta slowing.  EEG was reactive to noxious stimuli.  Hyperventilation photic summation were not performed. Abnormality -Continuous slow, generalized, maximal bitemporal IMPRESSION: This study  is suggestive of nonspecific bitemporal cortical dysfunction as well as severe diffuse encephalopathy, nonspecific to etiology but could be secondary to sedation. No seizures or epileptiform discharges were seen throughout the recording. Lora Havens   DG Chest 1 View  Result Date: 10/27/2019 CLINICAL DATA:  Difficult intubation status post CPR EXAM: CHEST  1 VIEW COMPARISON:  October 27, 2019 at 12:19 p.m. FINDINGS: The heart size remains enlarged. There is a small to moderate-sized left-sided pleural effusion. There is a small right-sided pleural effusion. There is diffuse increased attenuation within the left lung field. Prominent interstitial lung markings are noted. There is a stable tunneled dialysis catheter on the right. The endotracheal tube terminates approximately 3 cm above the carina. There is no pneumothorax. IMPRESSION: 1. Lines and tubes as above. 2. Persistent bilateral pleural effusions, left greater than right. 3. Worsening left-sided airspace disease concerning for pneumonia. Electronically Signed   By: Constance Holster M.D.   On: 10/27/2019 20:07   DG Chest 2 View  Result Date: 10/27/2019 CLINICAL DATA:  44 year old male with history of chest pain. Vomiting for the past 2 weeks. Diarrhea since yesterday. EXAM: CHEST - 2 VIEW COMPARISON:  Chest x-ray 10/03/2019. FINDINGS: Right internal jugular PermCath with tips terminating in the right atrium and superior cavoatrial junction. Left upper extremity vascular stent extending into the axillary region. Lung volumes are slightly low. Opacity at the left base may reflect atelectasis and/or consolidation. Small to moderate left pleural effusion, slightly decreased compared to the prior study. Right lung is clear. No right pleural effusion. No pneumothorax. No definite suspicious appearing pulmonary nodules or masses. No evidence of pulmonary edema. Mild cardiomegaly. Upper mediastinal contours are within normal limits. IMPRESSION: 1. Support  apparatus and postoperative changes, as above. 2. Left lower lobe atelectasis and/or consolidation with superimposed small to moderate left pleural effusion, slightly decreased compared to the prior study. Electronically Signed   By: Vinnie Langton M.D.   On: 10/27/2019 12:36   DG Chest 2 View  Result Date: 10/03/2019 CLINICAL DATA:  Chest pressure since yesterday. States has had cough for one week. Was tested for COVID yesterday but does not have result. DIalysis patient with permacath R upper chest. Hx - ESRD, HTN, AAA, current smoker 0.5 ppd. EXAM: CHEST - 2 VIEW COMPARISON:  08/25/2019.  CT, 07/20/2019. FINDINGS: Cardiac silhouette is mildly enlarged. No mediastinal or hilar masses. Right internal jugular tunneled dual lumen central venous catheter is stable, distal tip in the right atrium. Moderate left pleural effusion associated with dependent left lung base opacity, the latter most likely atelectasis although pneumonia is possible. Lungs show prominent bronchovascular markings bilaterally, but are otherwise clear. No pulmonary edema. No pneumothorax. Skeletal structures are grossly intact. Stable left axillary to upper extremity vascular stent. IMPRESSION: 1. Moderate right pleural effusion, which appears decreased compared to the prior chest radiograph. 2. Left lung base opacity, which is most likely atelectasis. Pneumonia is not excluded but felt less likely. 3. No evidence of pulmonary edema.  Mild cardiomegaly. Electronically Signed   By: Lajean Manes M.D.   On: 10/03/2019 12:58   DG Ankle Complete Right  Result Date: 10/27/2019 CLINICAL DATA:  Chronic wound with cellulitis EXAM: RIGHT ANKLE - COMPLETE 3+ VIEW COMPARISON:  None. FINDINGS: Frontal, oblique, and lateral views obtained. There is evidence of old trauma involving the calcaneus with remodeling. No acute fracture or joint effusion is evident. There is narrowing of the posterior aspect of the ankle joint. There is arthropathy  throughout the subtalar joint regions. No frank erosion or bony destruction evident. IMPRESSION: Evidence of old trauma involving the calcaneus. Arthropathy throughout the subtalar joints. There is narrowing of the posterior ankle joint. No frank bony destruction appreciated by radiography. No acute fracture or joint effusion evident. If there remains concern for osteomyelitis, MR or nuclear medicine three-phase bone scan could be helpful for further assessment. Electronically Signed   By: Lowella Grip III M.D.   On: 10/27/2019 13:09   DG Abd 1 View  Result Date: 10/29/2019 CLINICAL DATA:  Orogastric tube placement EXAM: ABDOMEN - 1 VIEW COMPARISON:  Portable exam 1328 hours compared to 10/28/2019 FINDINGS: Nasogastric tube coiled in proximal stomach. Tips of dual lumen RIGHT jugular central venous catheter projects over cavoatrial junction and upper RIGHT atrium. Tip of endotracheal tube projects approximately 3.9 cm above carina. Enlargement of cardiac silhouette with atelectasis versus consolidation of LEFT lower lobe and associated small LEFT pleural effusion. Vascular stents identified in upper abdomen. IMPRESSION: Nasogastric tube coiled in proximal stomach. Persistent atelectasis versus consolidation LEFT lower lobe with associated small LEFT pleural effusion. Electronically Signed   By: Lavonia Dana M.D.   On: 10/29/2019 13:45   DG Abd 1 View  Result Date: 10/28/2019 CLINICAL DATA:  OG tube placement. EXAM: ABDOMEN - 1 VIEW COMPARISON:  Single-view of the abdomen 10/04/2019. FINDINGS: OG tube is in place with the tip in the distal stomach. IMPRESSION: As above. Electronically Signed   By: Inge Rise M.D.   On: 10/28/2019 11:56   DG Abd 1 View  Result Date: 10/04/2019 CLINICAL DATA:  NG tube placement EXAM: ABDOMEN - 1 VIEW COMPARISON:  10/04/2019 FINDINGS: Interval placement of esophagogastric tube, tip near the gastroesophageal junction and side port above the diaphragm. Recommend  advancement to ensure subdiaphragmatic positioning of tip and side port. Nonobstructive pattern of bowel gas in the included abdomen. No obvious free air. Stent projects over the central abdomen. Cardiomegaly. IMPRESSION: Interval placement of esophagogastric tube, tip near the gastroesophageal junction and side port above the diaphragm. Recommend advancement to ensure subdiaphragmatic positioning of tip and side port. Electronically Signed   By: Eddie Candle M.D.   On: 10/04/2019 17:10   CT HEAD WO CONTRAST  Result Date: 10/28/2019 CLINICAL DATA:  Cardiac arrest. EXAM: CT HEAD WITHOUT CONTRAST TECHNIQUE: Contiguous axial images were obtained from the base of the skull through the vertex without intravenous contrast. COMPARISON:  None. FINDINGS: Brain: There is no mass, hemorrhage or extra-axial collection. The size and configuration of the ventricles and extra-axial CSF spaces are normal. The brain parenchyma is normal, without acute or chronic infarction. Vascular: No abnormal hyperdensity of the major intracranial arteries or dural venous sinuses. No intracranial atherosclerosis. Skull: The visualized skull base, calvarium and extracranial soft tissues are normal. Sinuses/Orbits: No fluid levels or advanced mucosal thickening of the visualized paranasal sinuses. No mastoid or middle ear effusion. The orbits are normal. IMPRESSION: Normal head CT. Electronically Signed   By: Ulyses Jarred M.D.   On: 10/28/2019 01:50   US Venous Img Upper Uni Right(DVT)  Result Date: 10/28/2019 CLINICAL DATA:  Dialysis patient, right arm graft removed, skin changes and swelling. EXAM: RIGHT UPPER EXTREMITY VENOUS DOPPLER ULTRASOUND TECHNIQUE: Gray-scale sonography with graded compression, as  well as color Doppler and duplex ultrasound were performed to evaluate the upper extremity deep venous system from the level of the subclavian vein and including the jugular, axillary, basilic, radial, ulnar and upper cephalic vein.  Spectral Doppler was utilized to evaluate flow at rest and with distal augmentation maneuvers. COMPARISON:  None. FINDINGS: Contralateral Subclavian Vein: Respiratory phasicity is normal and symmetric with the symptomatic side. No evidence of thrombus. Normal compressibility. Internal Jugular Vein: No evidence of thrombus. Normal compressibility, respiratory phasicity and response to augmentation. Subclavian Vein: No evidence of thrombus. Normal compressibility, respiratory phasicity and response to augmentation. Axillary Vein: No evidence of thrombus. Normal compressibility, respiratory phasicity and response to augmentation. Cephalic Vein: No evidence of thrombus. Normal compressibility, respiratory phasicity and response to augmentation. Basilic Vein: No evidence of thrombus. Normal compressibility, respiratory phasicity and response to augmentation. Brachial Veins: No evidence of thrombus. Normal compressibility, respiratory phasicity and response to augmentation. Radial Veins: The forearm radial vein demonstrates hypoechoic thrombus appearing occlusive. Vein noncompressible. Minor thrombus burden without propagation into the brachial vein of the upper arm. Ulnar Veins: No evidence of thrombus. Normal compressibility, respiratory phasicity and response to augmentation. IMPRESSION: Right forearm radial vein DVT. Very low thrombus burden. No propagation into the brachial vein. Electronically Signed   By: Jerilynn Mages.  Shick M.D.   On: 10/28/2019 10:31   DG Chest Port 1 View  Result Date: 10/28/2019 CLINICAL DATA:  Acute respiratory failure EXAM: PORTABLE CHEST 1 VIEW COMPARISON:  Radiograph 10/27/2019 FINDINGS: Dual lumen right IJ approach dialysis catheter tip terminates at the level of the right atrium and superior cavoatrial junction. An endotracheal tube terminates low in the trachea, approximately 1 cm from the carina. Pacer pad overlies the left chest wall. Additional support devices project over the chest. Left  axillary vascular stent is again seen. Lung volumes remain diminished with bilateral patchy airspace opacities similar on the left and slightly increasing on the right when compared to prior imaging. No pneumothorax. Suspect left pleural effusion. No visible right effusion. No acute osseous or soft tissue abnormality. IMPRESSION: 1. Endotracheal tube terminates low in the trachea, approximately 1 cm from the carina. Consider retraction 2-3 cm to position in the mid trachea. 2. Bilateral patchy airspace opacities similar on the left and slightly increasing on the right when compared to prior imaging. Worrisome for infectious consolidation versus edema. 3. Left pleural effusion. These results will be called to the ordering clinician or representative by the Radiologist Assistant, and communication documented in the PACS or zVision Dashboard. Electronically Signed   By: Lovena Le M.D.   On: 10/28/2019 03:13   DG Abd 2 Views  Result Date: 10/04/2019 CLINICAL DATA:  Vomiting. EXAM: ABDOMEN - 2 VIEW COMPARISON:  08/25/2019 FINDINGS: There is no bowel dilation to suggest obstruction. No air-fluid levels or free air. Vascular stent overlies the upper to mid lumbar spine. A right common iliac stent is present. These are stable from prior study. There are surgical vascular clips in the central abdomen and left lower quadrant. These are also unchanged. No evidence of renal or ureteral stones. There is opacity at the left lung base, incompletely imaged. Skeletal structures unremarkable. IMPRESSION: 1. No evidence of bowel obstruction or free air.  No acute findings. Electronically Signed   By: Lajean Manes M.D.   On: 10/04/2019 16:11   ECHOCARDIOGRAM COMPLETE  Result Date: 10/29/2019   ECHOCARDIOGRAM REPORT   Patient Name:   Brett Small Date of Exam: 10/29/2019 Medical Rec #:  [id number]  Height:       61.0 in Accession #:    [id number]     Weight:       100.3 lb Date of Birth:  Jan 11, 1976      BSA:          1.41  m Patient Age:    48 years       BP:           None listed/None listed mmHg Patient Gender: M              HR:           74 bpm. Exam Location:  [facility] Procedure: 2D Echo, Cardiac Doppler and Color Doppler Indications:     bacteremia 790.7  History:         Patient has prior history of Echocardiogram examinations, most                  recent 08/04/2019. Risk Factors:Hypertension. PAD.  Sonographer:     Sherrie Sport RDCS (AE) Referring Phys:  [id number] [name] Diagnosing Phys: Bartholome Bill MD IMPRESSIONS  1. Left ventricular ejection fraction, by visual estimation, is 25 to 30%. The left ventricle has moderate to severely decreased function. Left ventricular septal wall thickness was mildly increased. Mildly increased left ventricular posterior wall thickness. There is mildly increased left ventricular hypertrophy.  2. Left ventricular diastolic parameters are consistent with Grade I diastolic dysfunction (impaired relaxation).  3. The left ventricle demonstrates global hypokinesis.  4. Global right ventricle has normal systolic function.The right ventricular size is mildly enlarged. No increase in right ventricular wall thickness.  5. Left atrial size was mildly dilated.  6. Right atrial size was mildly dilated.  7. Moderate pleural effusion in the left lateral region.  8. The pericardial effusion is circumferential.  9. Trivial pericardial effusion is present. 10. The mitral valve is grossly normal. Mild mitral valve regurgitation. 11. The tricuspid valve is grossly normal. 12. The aortic valve is tricuspid. Aortic valve regurgitation is trivial. 13. The pulmonic valve was not well visualized. Pulmonic valve regurgitation is trivial. 14. Moderately elevated pulmonary artery systolic pressure. FINDINGS  Left Ventricle: Left ventricular ejection fraction, by visual estimation, is 25 to 30%. The left ventricle has moderate to severely decreased function. The left ventricle demonstrates global hypokinesis. Mildly  increased left ventricular posterior wall thickness. There is mildly increased left ventricular hypertrophy. Left ventricular diastolic parameters are consistent with Grade I diastolic dysfunction (impaired relaxation). Right Ventricle: The right ventricular size is mildly enlarged. No increase in right ventricular wall thickness. Global RV systolic function is has normal systolic function. The tricuspid regurgitant velocity is 2.79 m/s, and with an assumed right atrial  pressure of 10 mmHg, the estimated right ventricular systolic pressure is moderately elevated at 41.1 mmHg. Left Atrium: Left atrial size was mildly dilated. Right Atrium: Right atrial size was mildly dilated Pericardium: Trivial pericardial effusion is present. The pericardial effusion is circumferential. There is no evidence of cardiac tamponade. There is a moderate pleural effusion in the left lateral region. Mitral Valve: The mitral valve is grossly normal. Mild mitral valve regurgitation. There is no evidence of mitral valve vegetation. Tricuspid Valve: The tricuspid valve is grossly normal. Tricuspid valve regurgitation is mild. Aortic Valve: The aortic valve is tricuspid. Aortic valve regurgitation is trivial. Aortic valve mean gradient measures 2.0 mmHg. Aortic valve peak gradient measures 4.2 mmHg. Aortic valve area, by VTI measures 1.81 cm. Pulmonic Valve: The pulmonic valve was not  well visualized. Pulmonic valve regurgitation is trivial. Pulmonic regurgitation is trivial. Aorta: The aortic root is normal in size and structure. IAS/Shunts: No atrial level shunt detected by color flow Doppler.  LEFT VENTRICLE PLAX 2D LVIDd:         5.16 cm       Diastology LVIDs:         4.48 cm       LV e' lateral:   6.42 cm/s LV PW:         0.91 cm       LV E/e' lateral: 11.0 LV IVS:        0.99 cm       LV e' medial:    4.57 cm/s LVOT diam:     2.00 cm       LV E/e' medial:  15.4 LV SV:         36 ml LV SV Index:   25.63 LVOT Area:     3.14 cm  LV  Volumes (MOD) LV area d, A4C:    22.10 cm LV area s, A4C:    20.70 cm LV major d, A4C:   5.96 cm LV major s, A4C:   6.08 cm LV vol d, MOD A4C: 67.5 ml LV vol s, MOD A4C: 58.7 ml LV SV MOD A4C:     67.5 ml RIGHT VENTRICLE RV Basal diam:  3.07 cm RV S prime:     8.49 cm/s TAPSE (M-mode): 2.8 cm LEFT ATRIUM           Index       RIGHT ATRIUM           Index LA diam:      3.35 cm 2.38 cm/m  RA Area:     16.40 cm LA Vol (A4C): 30.0 ml 21.30 ml/m RA Volume:   45.00 ml  31.95 ml/m  AORTIC VALVE                   PULMONIC VALVE AV Area (Vmax):    1.71 cm    PV Vmax:        0.47 m/s AV Area (Vmean):   1.89 cm    PV Peak grad:   0.9 mmHg AV Area (VTI):     1.81 cm    RVOT Peak grad: 3 mmHg AV Vmax:           103.00 cm/s AV Vmean:          63.450 cm/s AV VTI:            0.138 m AV Peak Grad:      4.2 mmHg AV Mean Grad:      2.0 mmHg LVOT Vmax:         56.20 cm/s LVOT Vmean:        38.200 cm/s LVOT VTI:          0.079 m LVOT/AV VTI ratio: 0.58  AORTA Ao Root diam: 2.60 cm MITRAL VALVE                        TRICUSPID VALVE MV Area (PHT): 3.89 cm             TR Peak grad:   31.1 mmHg MV PHT:        56.55 msec           TR Vmax:        289.00 cm/s MV Decel Time: 195 msec MV E  velocity: 70.30 cm/s 103 cm/s  SHUNTS MV A velocity: 36.40 cm/s 70.3 cm/s Systemic VTI:  0.08 m MV E/A ratio:  1.93       1.5       Systemic Diam: 2.00 cm  Bartholome Bill MD Electronically signed by Bartholome Bill MD Signature Date/Time: 10/29/2019/10:46:08 AM    Final     Time Spent in minutes 35   Anjel Pardo M.D on 11/01/2019 at 11:42 AM  Between 7am to 7pm - Pager - [phone]  After 7pm go to [email or web address] - password [facility]  [facility] -  Office  [phone]

## 2019-11-01 NOTE — Consult Note (Signed)
[place] for Electrolyte Monitoring and Replacement   Recent Labs: Potassium (mmol/L)  Date Value  11/01/2019 4.0   Magnesium (mg/dL)  Date Value  11/01/2019 2.0   Calcium (mg/dL)  Date Value  11/01/2019 8.6 (L)   Albumin (g/dL)  Date Value  11/01/2019 2.5 (L)   Phosphorus (mg/dL)  Date Value  11/01/2019 2.4 (L)   Sodium (mmol/L)  Date Value  11/01/2019 138   Corrected Ca: 9.1 mg/dL  Assessment: Brett Small is a 44 y.o. male admitted on 10/27/2019 with sepsis and acute respiratory failure, as well as cardiac arrest x2 (but did not qualify for hypothermia protocol).  Patient has past medical history of ESRD, hypertension, and peripheral artery disease.   Patient normally receives HD TTS, but was changed to CRRT inpatient due to hemodynamic instability. Patient is on Veltassa.  Corrected Ca:  9.4  Goal of Therapy:  K 4.0-5.1 mmol/L Magnesium 2.0-2.4 mg/dL All other electrolytes WNL  Plan:  Patient has transitioned off of CRRT, received HD yesterday. Nephrology following. Phos only slightly low today. Will defer replacing in setting of ESRD unless otherwise ordered by Nephrology. Electrolytes with am labs.  Tawnya Crook, PharmD Clinical Pharmacist 11/01/2019 7:47 AM

## 2019-11-01 NOTE — Progress Notes (Signed)
I will make patient NPO after midnight for possible angio tomorrow.  Dr Lucky Cowboy will evaluate him tomorrow morning and speak with the patient.  Annamarie Major

## 2019-11-01 NOTE — Progress Notes (Signed)
[facility]  ROUNDING NOTE   Subjective:   Hemodialysis treatment yesterday. Tolerated treatment well. UF of 1 liter.   Patient required TPA for arterial line of permcath last night prior to dialysis treatment.   Complains of right upper extremity pain.   HIT negative.   Objective:  Vital signs in last 24 hours:  Temp:  [97.7 F (36.5 C)-98.8 F (37.1 C)] 97.9 F (36.6 C) (01/09 2300) Pulse Rate:  [111-124] 117 (01/10 0600) Resp:  [8-16] 11 (01/10 0600) BP: (116-168)/(78-110) 163/108 (01/10 0600) SpO2:  [86 %-100 %] 96 % (01/10 0600) Arterial Line BP: (145)/(97) 145/97 (01/09 1000) Weight:  [44.5 kg] 44.5 kg (01/10 0500)  Weight change: 0 kg Filed Weights   10/30/19 0400 10/31/19 0500 11/01/19 0500  Weight: 44.5 kg 44.5 kg 44.5 kg    Intake/Output: I/O last 3 completed shifts: In: 1824.1 [P.O.:148; I.V.:86.1; Blood:290; IV Piggyback:1300] Out: 6734 [LPFXT:0240]   Intake/Output this shift:  No intake/output data recorded.  Physical Exam: General: NAD, laying in bed  Head: Normocephalic, atraumatic. Moist oral mucosal membranes  Eyes: Anicteric, PERRL  Neck: Supple, trachea midline  Lungs:  Clear to auscultation  Heart: tachycardia  Abdomen:  Soft, nontender,   Extremities: Right upper extremity tenderness and edema  Neurologic: Nonfocal, moving all four extremities  Skin: No lesions  Access: RIJ permcath, Clotted left AVG    Basic Metabolic Panel: Recent Labs  Lab 10/28/19 0445 10/29/19 0206 10/30/19 0430 10/30/19 1921 10/31/19 0532 10/31/19 1609 11/01/19 0407  NA 132*  --  136 134* 135 136 138  K 4.7  --  4.6 4.4 4.6 4.5 4.0  CL 87*  --  103 104 103 102 102  CO2 30  --  24 21* _0 GLUCOSE 125*  --  135* 144* 128* 154* 110*  BUN 72*  --  22* 21* 18 25* 14  CREATININE 5.80*  --  1.29* 1.20 1.11 1.83* 1.34*  CALCIUM 8.2*  --  7.6* 7.8* 8.2* 8.6* 8.6*  MG 1.8 2.4 2.2  --  2.3  --  2.0  PHOS 8.3*  --  2.2* 1.6* 1.8* 4.0 2.4*     Liver Function Tests: Recent Labs  Lab 10/27/19 1823 10/28/19 0445 10/30/19 0430 10/30/19 1921 10/31/19 0532 10/31/19 1609 11/01/19 0407  AST 29 37  --   --   --   --   --   ALT 14 19  --   --   --   --   --   ALKPHOS 63 58  --   --   --   --   --   BILITOT 1.9* 2.5*  --   --   --   --   --   PROT 5.0* 4.3*  --   --   --   --   --   ALBUMIN 2.1* 2.0* 2.1* 2.1* 2.4* 2.5* 2.5*   No results for input(s): LIPASE, AMYLASE in the last 168 hours. No results for input(s): AMMONIA in the last 168 hours.  CBC: Recent Labs  Lab 10/28/19 0756 10/29/19 0206 10/30/19 0430 10/31/19 0050 10/31/19 0532 11/01/19 0407  WBC 8.7 15.9* 10.9* 12.8* 13.0* 18.1*  NEUTROABS 7.7  --   --   --   --   --   HGB 7.3* 8.2* 7.5* 6.5* 8.7* 9.2*  HCT 22.4* 26.7* 25.2* 22.7* 28.5* 30.0*  MCV 91.8 94.3 95.8 99.1 94.1 94.9  PLT 50* 79* 36* 58* 62* 100*  Cardiac Enzymes: No results for input(s): CKTOTAL, CKMB, CKMBINDEX, TROPONINI in the last 168 hours.  BNP: Invalid input(s): POCBNP  CBG: Recent Labs  Lab 10/31/19 1148 10/31/19 1800 10/31/19 2325 10/31/19 2355 11/01/19 0620  GLUCAP 125* 149* 162* 176* 122*    Microbiology: Results for orders placed or performed during the hospital encounter of 10/27/19  Blood Culture (routine x 2)     Status: Abnormal   Collection Time: 10/27/19  1:07 PM   Specimen: BLOOD  Result Value Ref Range Status   Specimen Description   Final    BLOOD BLOOD RIGHT HAND Performed at [facility], [address], [place], [place] [address]    Special Requests   Final    BOTTLES DRAWN AEROBIC AND ANAEROBIC Blood Culture results may not be optimal due to an inadequate volume of blood received in culture bottles Performed at [facility], [address], [place], [place] [address]    Culture  Setup Time   Final    GRAM POSITIVE RODS AEROBIC BOTTLE ONLY CRITICAL RESULT CALLED TO, READ BACK BY AND VERIFIED WITH: SCOTT HALL 10/28/19 @ 2210   [place] Performed at [facility], [address] [place], [place] [address]    Culture CLOSTRIDIUM SPECIES (A)  Final   Report Status 10/31/2019 FINAL  Final  Blood Culture (routine x 2)     Status: None   Collection Time: 10/27/19  1:07 PM   Specimen: BLOOD  Result Value Ref Range Status   Specimen Description BLOOD BLOOD LEFT HAND  Final   Special Requests   Final    BOTTLES DRAWN AEROBIC ONLY Blood Culture results may not be optimal due to an inadequate volume of blood received in culture bottles   Culture   Final    NO GROWTH 5 DAYS Performed at [facility], [address] [place], [place] [address]    Report Status 11/01/2019 FINAL  Final  SARS CORONAVIRUS 2 (TAT 6-24 HRS) Nasopharyngeal Nasopharyngeal Swab     Status: None   Collection Time: 10/27/19  3:54 PM   Specimen: Nasopharyngeal Swab  Result Value Ref Range Status   SARS Coronavirus 2 NEGATIVE NEGATIVE Final    Comment: (NOTE) SARS-CoV-2 target nucleic acids are NOT DETECTED. The SARS-CoV-2 RNA is generally detectable in upper and lower respiratory specimens during the acute phase of infection. Negative results do not preclude SARS-CoV-2 infection, do not rule out co-infections with other pathogens, and should not be used as the sole basis for treatment or other patient management decisions. Negative results must be combined with clinical observations, patient history, and epidemiological information. The expected result is Negative. Fact Sheet for Patients: SugarRoll.be Fact Sheet for Healthcare Providers: https://www.woods-mathews.com/ This test is not yet approved or cleared by the [place] FDA and  has been authorized for detection and/or diagnosis of SARS-CoV-2 by FDA under an Emergency Use Authorization (EUA). This EUA will remain  in effect (meaning this test can be used) for the duration of the COVID-19 declaration under Section 56 4(b)(1) of  the Act, 21 U.S.C. section 360bbb-3(b)(1), unless the authorization is terminated or revoked sooner. Performed at [facility], [place] [address], [place], [place] [address]   MRSA PCR Screening     Status: Abnormal   Collection Time: 10/27/19  9:52 PM   Specimen: Nasopharyngeal  Result Value Ref Range Status   MRSA by PCR POSITIVE (A) NEGATIVE Final    Comment:  The GeneXpert MRSA Assay (FDA approved for NASAL specimens only), is one component of a comprehensive MRSA colonization surveillance program. It is not intended to diagnose MRSA infection nor to guide or monitor treatment for MRSA infections. RESULT CALLED TO, READ BACK BY AND VERIFIED WITH: Shary Key RN [phone] 10/27/19 [facility] Performed at [facility], [address] [place],  [address]     Coagulation Studies: No results for input(s): LABPROT, INR in the last 72 hours.  Urinalysis: No results for input(s): COLORURINE, LABSPEC, PHURINE, GLUCOSEU, HGBUR, BILIRUBINUR, KETONESUR, PROTEINUR, UROBILINOGEN, NITRITE, LEUKOCYTESUR in the last 72 hours.  Invalid input(s): APPERANCEUR    Imaging: No results found.   Medications:   .  prismasol BGK 4/2.5 400 mL/hr at 10/30/19 0738  .  prismasol BGK 4/2.5 400 mL/hr at 10/30/19 0738  . sodium chloride Stopped (11/01/19 0138)  . argatroban 0.1 mcg/kg/min (11/01/19 0600)  . ceFEPime (MAXIPIME) IV Stopped (10/31/19 2005)  . citrate dextrose    . DOBUTamine Stopped (10/30/19 1008)  . doxycycline (VIBRAMYCIN) IV Stopped (11/01/19 0132)  . feeding supplement (VITAL AF 1.2 CAL) Stopped (10/29/19 1305)  . prismasol BGK 4/2.5 1,000 mL/hr at 10/30/19 1809  . [START ON 11/03/2019] vancomycin     . B-complex with vitamin C  1 tablet Per Tube QHS  . chlorhexidine gluconate (MEDLINE KIT)  15 mL Mouth Rinse BID  . Chlorhexidine Gluconate Cloth  6 each Topical Daily  . [START ON 11/03/2019] epoetin (EPOGEN/PROCRIT) injection  10,000 Units Intravenous Q  T,Th,Sa-HD  . hydrocortisone sod succinate (SOLU-CORTEF) inj  50 mg Intravenous Q6H  . insulin aspart  0-9 Units Subcutaneous Q6H  . mouth rinse  15 mL Mouth Rinse QID  . mupirocin ointment  1 application Nasal BID  . pantoprazole sodium  40 mg Per Tube QHS  . patiromer  8.4 g Oral Daily  . sodium chloride flush  10-40 mL Intracatheter Q12H  . vancomycin variable dose per unstable renal function (pharmacist dosing)   Does not apply See admin instructions   sodium chloride, acetaminophen **OR** acetaminophen, bisacodyl, citrate dextrose, fentaNYL (SUBLIMAZE) injection, hydrALAZINE, LORazepam, magnesium citrate, ondansetron **OR** ondansetron (ZOFRAN) IV, polyethylene glycol, sodium chloride flush, traMADol  Assessment/ Plan:  [name] is a 44 y.o. white male with end stage renal disease on hemodialysis, 44 y.o. male with end-stage renal disease on hemodialysis, hypertension, peripheral vascular disease, depression, tobacco abuse, admitted to [facility] ICU on 10/27/2019 for Cardiac arrest [facility]) [I46.9] Hyperkalemia [E87.5] Difficult intubation [T88.4XXA] Sepsis due to pneumonia ([place]) [J18.9, A41.9] Sepsis, due to unspecified organism, unspecified whether acute organ dysfunction present ([place]) [A41.9]   Placed on CRRT from 1/5 -1/9. Now off vasopressors. Transitioned to intermittent hemodialysis on 1/9  [place] TTS RIJ catheter  1.  ESRD on HD TTS.   - hemodialysis treatment yesterday. Tolerated treatment well. Next treatment for Tuesday.  - Complication of dialysis device. Clotted arterial line of permcath. Status post TPA on 1/9  2.  Anemia of chronic kidney disease.  Hemoglobin 9.2 With thrombocytopenia - EPO with HD treatment  3. Hypertension: hemodynamically stable off vasopressors. Tachycardia on examination.  Home regimen of amlodipine, carvedilol, and hydralazine.  - restart carvedilol due to tachycardia.   4.  Secondary hyperparathyroidism with  hypophosphatemia - IV replacement.   5. Right upper extremity: with thrombocytopenia - improving.  HIT negative - argatroban.    LOS: 5 Brett Small 1/10/20219:10 AM

## 2019-11-02 ENCOUNTER — Other Ambulatory Visit (INDEPENDENT_AMBULATORY_CARE_PROVIDER_SITE_OTHER): Payer: Self-pay | Admitting: Vascular Surgery

## 2019-11-02 ENCOUNTER — Inpatient Hospital Stay
Admission: EM | Disposition: A | Discharge status: Home Health | Payer: Self-pay | Source: Home / Self Care | Attending: Internal Medicine

## 2019-11-02 DIAGNOSIS — I739 Peripheral vascular disease, unspecified: Secondary | ICD-10-CM

## 2019-11-02 DIAGNOSIS — I998 Other disorder of circulatory system: Secondary | ICD-10-CM

## 2019-11-02 DIAGNOSIS — N186 End stage renal disease: Secondary | ICD-10-CM

## 2019-11-02 DIAGNOSIS — Z992 Dependence on renal dialysis: Secondary | ICD-10-CM

## 2019-11-02 HISTORY — PX: UPPER EXTREMITY INTERVENTION: CATH118271

## 2019-11-02 LAB — GLUCOSE, CAPILLARY
Glucose-Capillary: 160 mg/dL — ABNORMAL HIGH (ref 70–99)
Glucose-Capillary: 158 mg/dL — ABNORMAL HIGH (ref 70–99)
Glucose-Capillary: 105 mg/dL — ABNORMAL HIGH (ref 70–99)

## 2019-11-02 LAB — CBC
HCT: 28.8 % — ABNORMAL LOW (ref 39.0–52.0)
Hemoglobin: 8.5 g/dL — ABNORMAL LOW (ref 13.0–17.0)
MCH: 28.6 pg (ref 26.0–34.0)
MCHC: 29.5 g/dL — ABNORMAL LOW (ref 30.0–36.0)
MCV: 97 fL (ref 80.0–100.0)
Platelets: 89 10*3/uL — ABNORMAL LOW (ref 150–400)
RBC: 2.97 MIL/uL — ABNORMAL LOW (ref 4.22–5.81)
RDW: 21.2 % — ABNORMAL HIGH (ref 11.5–15.5)
WBC: 12.1 10*3/uL — ABNORMAL HIGH (ref 4.0–10.5)
nRBC: 0 % (ref 0.0–0.2)

## 2019-11-02 LAB — APTT: aPTT: 55 seconds — ABNORMAL HIGH (ref 24–36)

## 2019-11-02 SURGERY — UPPER EXTREMITY INTERVENTION
Anesthesia: Moderate Sedation | Laterality: Right

## 2019-11-02 MED ORDER — FENTANYL CITRATE (PF) 100 MCG/2ML IJ SOLN
INTRAMUSCULAR | Status: AC
  Filled 2019-11-02: qty 2

## 2019-11-02 MED ORDER — RENA-VITE PO TABS
1.0000 | ORAL_TABLET | Freq: Every day | ORAL | Status: DC
  Administered 2019-11-03: 1 via ORAL
  Filled 2019-11-02 (×4): qty 1

## 2019-11-02 MED ORDER — FAMOTIDINE 20 MG PO TABS: 40.0000 mg | ORAL_TABLET | Freq: Once | ORAL | Status: DC | PRN

## 2019-11-02 MED ORDER — CLINDAMYCIN PHOSPHATE 300 MG/50ML IV SOLN: 300.0000 mg | Freq: Once | INTRAVENOUS | Status: DC

## 2019-11-02 MED ORDER — HYDROMORPHONE HCL 1 MG/ML IJ SOLN
1.0000 mg | Freq: Once | INTRAMUSCULAR | Status: AC | PRN
  Administered 2019-11-03: 1 mg via INTRAVENOUS
  Filled 2019-11-02: qty 1

## 2019-11-02 MED ORDER — MIDAZOLAM HCL 5 MG/5ML IJ SOLN
INTRAMUSCULAR | Status: AC
  Filled 2019-11-02: qty 5

## 2019-11-02 MED ORDER — METHYLPREDNISOLONE SODIUM SUCC 125 MG IJ SOLR: 125.0000 mg | Freq: Once | INTRAMUSCULAR | Status: DC | PRN

## 2019-11-02 MED ORDER — DIPHENHYDRAMINE HCL 50 MG/ML IJ SOLN: 50.0000 mg | Freq: Once | INTRAMUSCULAR | Status: DC | PRN

## 2019-11-02 MED ORDER — FENTANYL CITRATE (PF) 100 MCG/2ML IJ SOLN
INTRAMUSCULAR | Status: DC | PRN
  Administered 2019-11-02: 50 ug via INTRAVENOUS
  Administered 2019-11-02 (×2): 25 ug via INTRAVENOUS

## 2019-11-02 MED ORDER — MIDAZOLAM HCL 2 MG/2ML IJ SOLN
INTRAMUSCULAR | Status: DC | PRN
  Administered 2019-11-02 (×3): 1 mg via INTRAVENOUS

## 2019-11-02 MED ORDER — HYDROCODONE-ACETAMINOPHEN 5-325 MG PO TABS
2.0000 | ORAL_TABLET | Freq: Once | ORAL | Status: AC
  Administered 2019-11-02: 17:00:00 2 via ORAL

## 2019-11-02 MED ORDER — HYDROCODONE-ACETAMINOPHEN 5-325 MG PO TABS
ORAL_TABLET | ORAL | Status: AC
  Filled 2019-11-02: qty 2

## 2019-11-02 MED ORDER — MIDAZOLAM HCL 2 MG/ML PO SYRP: 8.0000 mg | ORAL_SOLUTION | Freq: Once | ORAL | Status: DC | PRN

## 2019-11-02 MED ORDER — NEPRO/CARBSTEADY PO LIQD: 237.0000 mL | Freq: Two times a day (BID) | ORAL | Status: DC

## 2019-11-02 MED ORDER — BIVALIRUDIN TRIFLUOROACETATE 250 MG IV SOLR
INTRAVENOUS | Status: AC
  Filled 2019-11-02: qty 250

## 2019-11-02 MED ORDER — IODIXANOL 320 MG/ML IV SOLN
INTRAVENOUS | Status: DC | PRN
  Administered 2019-11-02: 110 mL via INTRA_ARTERIAL

## 2019-11-02 MED ORDER — ONDANSETRON HCL 4 MG/2ML IJ SOLN: 4.0000 mg | Freq: Four times a day (QID) | INTRAMUSCULAR | Status: DC | PRN

## 2019-11-02 MED ORDER — SODIUM CHLORIDE 0.9 % IV SOLN: INTRAVENOUS | Status: DC

## 2019-11-02 SURGICAL SUPPLY — 35 items
BALLN DORADO 5X40X80 (BALLOONS) ×3
BALLN LUTONIX DCB 4X60X130 (BALLOONS) ×3
BALLN ULTRV 018 3X100X75 (BALLOONS) ×3
BALLOON DORADO 5X40X80 (BALLOONS) IMPLANT
BALLOON LUTONIX DCB 4X60X130 (BALLOONS) IMPLANT
BALLOON ULTRV 018 3X100X75 (BALLOONS) IMPLANT
CANISTER PENUMBRA ENGINE (MISCELLANEOUS) ×2 IMPLANT
CANNULA 5F STIFF (CANNULA) ×2 IMPLANT
CATH ANGIO 5F 100CM .035 PIG (CATHETERS) ×2 IMPLANT
CATH BEACON 5 .035 100 H1 TIP (CATHETERS) ×2 IMPLANT
CATH BEACON 5 .035 40 KMP TP (CATHETERS) IMPLANT
CATH BEACON 5 .038 40 KMP TP (CATHETERS) ×2
CATH CXI SUPP ANG 4FR 135 (CATHETERS) IMPLANT
CATH CXI SUPP ANG 4FR 135CM (CATHETERS) ×3
CATH INDIGO CAT6 KIT (CATHETERS) ×2 IMPLANT
CATH PALIN MAXID VT KIT 19CM (CATHETERS) ×2 IMPLANT
DEVICE PRESTO INFLATION (MISCELLANEOUS) ×4 IMPLANT
DEVICE RAD TR BAND REGULAR (VASCULAR PRODUCTS) ×2 IMPLANT
DEVICE STARCLOSE SE CLOSURE (Vascular Products) ×2 IMPLANT
DEVICE TORQUE .014-.018 (MISCELLANEOUS) IMPLANT
DEVICE TORQUE .025-.038 (MISCELLANEOUS) ×2 IMPLANT
GLIDEWIRE ADV .035X260CM (WIRE) ×2 IMPLANT
GLIDEWIRE ANGLED SS 035X260CM (WIRE) ×2 IMPLANT
GUIDEWIRE ADV .018X180CM (WIRE) ×2 IMPLANT
GUIDEWIRE PFTE-COATED .018X300 (WIRE) ×2 IMPLANT
GUIDEWIRE SUPER STIFF .035X180 (WIRE) ×2 IMPLANT
SHEATH BRITE TIP 5FRX11 (SHEATH) ×2 IMPLANT
SHEATH HALO 035 5FRX10 (SHEATH) ×2 IMPLANT
SHEATH PINNACLE ST 6F 65CM (SHEATH) ×2 IMPLANT
STENT VIABAHN 6X50X120 (Permanent Stent) ×2 IMPLANT
SUT MNCRL AB 4-0 PS2 18 (SUTURE) ×2 IMPLANT
SUT PROLENE 0 CT 1 30 (SUTURE) ×2 IMPLANT
TORQUE DEVICE .014-.018 (MISCELLANEOUS) ×3
TUBING CONTRAST HIGH PRESS 72 (TUBING) ×2 IMPLANT
WIRE J 3MM .035X145CM (WIRE) ×2 IMPLANT

## 2019-11-02 NOTE — Consult Note (Signed)
[place] for Electrolyte Monitoring and Replacement   Recent Labs: Potassium (mmol/L)  Date Value  11/01/2019 4.0   Magnesium (mg/dL)  Date Value  11/01/2019 2.0   Calcium (mg/dL)  Date Value  11/01/2019 8.6 (L)   Albumin (g/dL)  Date Value  11/01/2019 2.5 (L)   Phosphorus (mg/dL)  Date Value  11/01/2019 2.4 (L)   Sodium (mmol/L)  Date Value  11/01/2019 138   Corrected Ca: 9.1 mg/dL  Assessment: 44 y.o. male admitted on 10/27/2019 with sepsis and acute respiratory failure, as well as cardiac arrest x2 (but did not qualify for hypothermia protocol).  Patient has past medical history of ESRD, hypertension, and peripheral artery disease.   Patient normally receives HD TTS, but was changed to CRRT inpatient due to hemodynamic instability. Patient ordered Veltassa 1/5-1/10.   Goal of Therapy:  K 4.0-5.1 mmol/L Magnesium 2.0-2.4 mg/dL All other electrolytes WNL  Plan:  No labs today. K has been >4 and patient usually on HD. Nephrology following.  Will defer replacing in setting of ESRD unless otherwise ordered by Nephrology.  As pt out of ICU, will sign off at this time.   Rocky Morel, PharmD, BCPS Clinical Pharmacist 11/02/2019 8:20 AM

## 2019-11-02 NOTE — Progress Notes (Signed)
PROGRESS NOTE                                                                                                                                                                                                             Patient Demographics:    Brett Small, is a 44 y.o. male, DOB - [birth year], JZP:[id number]  Admit date - 10/27/2019   Admitting Physician Ezekiel Slocumb, DO  Outpatient Primary MD for the patient is Patient, [name]  LOS - 6  Outpatient Specialists: renal  Chief Complaint  Patient presents with  . Emesis       Brief Narrative  44 year old male with ESRD on dialysis, chronic systolic CHF with EF <48% presented to the ED on 10/26/2018 with chest pain, shortness of breath and vomiting.  He was found to have sepsis secondary to pneumonia, metabolic acidosis and NSTEMI.  He had PEA in the ED and CODE BLUE was called.  Patient received CPR and given 2 rounds of epinephrine and calcium gluconate after which ROSC was obtained.  Patient intubated and transferred to ED.  Successfully extubated on 1/8.   Patient also developed DVT of the right radial vein. Transferred to hospital service on 1/9.   Subjective:   Still having some pain in his right hand but better.  Heart rate better controlled with beta-blocker.   Assessment  & Plan :    Principal Problem: Sepsis due to pneumonia [facility]) He will complete 7 days of antibiotic after today.  Cultures so far negative.  Active problems Acute respiratory failure with hypoxia and hypercapnia. Intubated and admitted to ICU.  Extubated on 1/8 and currently maintaining sats on 2 L via nasal cannula.   NSTEMI (non-ST elevated myocardial infarction) ([place]) Has severe cardiomyopathy.  On argatroban drip.  Heart rate improved after Coreg resumed.  Will confirm with cardiology if cardiac cath has been planned.  Right radial vein DVT with thrombocytopenia/clotted arterial  line permacath On argatroban.  HIT antibody negative. For right upper extremity arteriogram today.     End stage renal disease on dialysis (TTS) (HCC) Clotted permacath, TPA given on 1/9.  He has left AV fistula (not matured for dialysis yet)   Hypertension   Depression      Chronic systolic CHF (congestive heart failure) ([place]) Euvolemic.  Volume management with dialysis.  Generalized  weakness PT evaluation.  Nutrition consult.  Anemia of chronic kidney disease Associated thrombocytopenia.  Platelets currently stable.  Improved with HD.  Essential hypertension Required pressors for sepsis with hypotension.  Amlodipine and hydralazine on hold.  Coreg resumed due to tachycardia.  Generalized weakness PT evaluation.  Code Status : Full code  Family Communication  : None  Disposition Plan  : Undetermined with a home versus (SNF) pending clinical improvement, vascular procedure,?  Cardiac cath.  PT evaluation. Barriers For Discharge : Active symptoms  Consults  : Critical care, cardiology, renal  Procedures  : Intubation, Doppler right upper extremity  DVT Prophylaxis  : IV argatroban  Lab Results  Component Value Date   PLT 89 (L) 11/02/2019    Antibiotics  :    Anti-infectives (From admission, onward)   Start     Dose/Rate Route Frequency Ordered Stop   11/03/19 1200  vancomycin (VANCOREADY) IVPB 500 mg/100 mL  Status:  Discontinued     500 mg 100 mL/hr over 60 Minutes Intravenous Every T-Th-Sa (Hemodialysis) 10/31/19 1430 10/31/19 1434   11/03/19 1200  [MAR Hold]  vancomycin (VANCOREADY) IVPB 500 mg/100 mL     (MAR Hold since Mon 11/02/2019 at 1043.Hold Reason: Transfer to a Procedural area.)   500 mg 100 mL/hr over 60 Minutes Intravenous Every T-Th-Sa (Hemodialysis) 10/31/19 1434 11/05/19 1159   10/31/19 2000  [MAR Hold]  ceFEPIme (MAXIPIME) 1 g in sodium chloride 0.9 % 100 mL IVPB     (MAR Hold since Mon 11/02/2019 at 1043.Hold Reason: Transfer to a Procedural  area.)   1 g 200 mL/hr over 30 Minutes Intravenous Every 24 hours 10/31/19 0847 11/04/19 1959   10/31/19 1445  vancomycin (VANCOREADY) IVPB 750 mg/150 mL     750 mg 150 mL/hr over 60 Minutes Intravenous  Once 10/31/19 1430 10/31/19 2306   10/31/19 0847  vancomycin variable dose per unstable renal function (pharmacist dosing)  Status:  Discontinued      Does not apply See admin instructions 10/31/19 0847 11/02/19 0835   10/30/19 2200  [MAR Hold]  doxycycline (VIBRAMYCIN) 100 mg in sodium chloride 0.9 % 250 mL IVPB     (MAR Hold since Mon 11/02/2019 at 1043.Hold Reason: Transfer to a Procedural area.)   100 mg 125 mL/hr over 120 Minutes Intravenous Every 12 hours 10/30/19 1241 11/04/19 2159   10/29/19 1200  vancomycin (VANCOREADY) IVPB 500 mg/100 mL  Status:  Discontinued     500 mg 100 mL/hr over 60 Minutes Intravenous Every T-Th-Sa (Hemodialysis) 10/27/19 1531 10/28/19 0846   10/28/19 2200  doxycycline (VIBRAMYCIN) 200 mg in dextrose 5 % 250 mL IVPB  Status:  Discontinued     200 mg 125 mL/hr over 120 Minutes Intravenous Every 12 hours 10/28/19 1057 10/30/19 1241   10/28/19 1800  ceFEPIme (MAXIPIME) 1 g in sodium chloride 0.9 % 100 mL IVPB  Status:  Discontinued     1 g 200 mL/hr over 30 Minutes Intravenous Every 24 hours 10/27/19 1531 10/28/19 0846   10/28/19 1200  vancomycin (VANCOREADY) IVPB 500 mg/100 mL  Status:  Discontinued     500 mg 100 mL/hr over 60 Minutes Intravenous Every 24 hours 10/28/19 0846 10/31/19 0847   10/28/19 1000  ceFEPIme (MAXIPIME) 2 g in sodium chloride 0.9 % 100 mL IVPB  Status:  Discontinued     2 g 200 mL/hr over 30 Minutes Intravenous Every 12 hours 10/28/19 0846 10/31/19 0847   10/27/19 1600  azithromycin (ZITHROMAX) 500 mg in  sodium chloride 0.9 % 250 mL IVPB  Status:  Discontinued     500 mg 250 mL/hr over 60 Minutes Intravenous Every 24 hours 10/27/19 1518 10/28/19 1053   10/27/19 1530  ceFEPIme (MAXIPIME) 2 g in sodium chloride 0.9 % 100 mL IVPB   Status:  Discontinued     2 g 200 mL/hr over 30 Minutes Intravenous  Once 10/27/19 1518 10/27/19 1525   10/27/19 1515  vancomycin (VANCOCIN) IVPB 1000 mg/200 mL premix  Status:  Discontinued    Note to Pharmacy: Please adjust start times accordingly as patient got broad spectrum antibiotics in ED.   1,000 mg 200 mL/hr over 60 Minutes Intravenous  Once 10/27/19 1518 10/27/19 1524   10/27/19 1245  ceFEPIme (MAXIPIME) 2 g in sodium chloride 0.9 % 100 mL IVPB     2 g 200 mL/hr over 30 Minutes Intravenous  Once 10/27/19 1240 10/27/19 1415   10/27/19 1245  metroNIDAZOLE (FLAGYL) IVPB 500 mg     500 mg 100 mL/hr over 60 Minutes Intravenous  Once 10/27/19 1240 10/27/19 1620   10/27/19 1245  vancomycin (VANCOCIN) IVPB 1000 mg/200 mL premix     1,000 mg 200 mL/hr over 60 Minutes Intravenous  Once 10/27/19 1240 10/27/19 1620        Objective:   Vitals:   11/01/19 1227 11/01/19 2321 11/02/19 0802 11/02/19 1044  BP: (!) 172/118 109/77 121/85 104/85  Pulse: (!) 126 96 96 84  Resp: '18 16 16 11  ' Temp: 98.2 F (36.8 C)  97.6 F (36.4 C) 97.9 F (36.6 C)  TempSrc: Oral  Oral Oral  SpO2: 93% 96% 97% 98%  Weight:    44.5 kg  Height:    '5\' 1"'  (1.549 m)    Wt Readings from Last 3 Encounters:  11/02/19 44.5 kg  10/07/19 46.8 kg  09/21/19 44.8 kg     Intake/Output Summary (Last 24 hours) at 11/02/2019 1130 Last data filed at 11/01/2019 2345 Gross per 24 hour  Intake 474.49 ml  Output 50 ml  Net 424.49 ml    Physical exam Middle-aged thin built male not in distress HEENT: Moist mucosa, supple neck Chest: Fine left basilar crackles, right-sided permacath CVs: Normal D0-V0, 3/6 systolic murmur GI: Soft, nondistended, nontender Musculoskeletal: Improved right wrist swelling     Data Review:    CBC Recent Labs  Lab 10/28/19 0756 10/30/19 0430 10/31/19 0050 10/31/19 0532 11/01/19 0407 11/02/19 0726  WBC 8.7 10.9* 12.8* 13.0* 18.1* 12.1*  HGB 7.3* 7.5* 6.5* 8.7* 9.2* 8.5*    HCT 22.4* 25.2* 22.7* 28.5* 30.0* 28.8*  PLT 50* 36* 58* 62* 100* 89*  MCV 91.8 95.8 99.1 94.1 94.9 97.0  MCH 29.9 28.5 28.4 28.7 29.1 28.6  MCHC 32.6 29.8* 28.6* 30.5 30.7 29.5*  RDW 21.3* 21.3* 21.4* 21.1* 21.6* 21.2*  LYMPHSABS 0.5*  --   --   --   --   --   MONOABS 0.4  --   --   --   --   --   EOSABS 0.0  --   --   --   --   --   BASOSABS 0.0  --   --   --   --   --     Chemistries  Recent Labs  Lab 10/27/19 1823 10/28/19 0445 10/29/19 0206 10/30/19 0430 10/30/19 1921 10/31/19 0532 10/31/19 1609 11/01/19 0407  NA 131* 132*  --  136 134* 135 136 138  K 4.8 4.7  --  4.6 4.4 4.6 4.5 4.0  CL 99 87*  --  103 104 103 102 102  CO2 11* 30  --  24 21* '26 22 25  ' GLUCOSE 104* 125*  --  135* 144* 128* 154* 110*  BUN 60* 72*  --  22* 21* 18 25* 14  CREATININE 5.18* 5.80*  --  1.29* 1.20 1.11 1.83* 1.34*  CALCIUM >15.0* 8.2*  --  7.6* 7.8* 8.2* 8.6* 8.6*  MG  --  1.8 2.4 2.2  --  2.3  --  2.0  AST 29 37  --   --   --   --   --   --   ALT 14 19  --   --   --   --   --   --   ALKPHOS 63 58  --   --   --   --   --   --   BILITOT 1.9* 2.5*  --   --   --   --   --   --    ------------------------------------------------------------------------------------------------------------------ No results for input(s): CHOL, HDL, LDLCALC, TRIG, CHOLHDL, LDLDIRECT in the last 72 hours.  Lab Results  Component Value Date   HGBA1C 4.7 (L) 07/09/2019   ------------------------------------------------------------------------------------------------------------------ No results for input(s): TSH, T4TOTAL, T3FREE, THYROIDAB in the last 72 hours.  Invalid input(s): FREET3 ------------------------------------------------------------------------------------------------------------------ No results for input(s): VITAMINB12, FOLATE, FERRITIN, TIBC, IRON, RETICCTPCT in the last 72 hours.  Coagulation profile Recent Labs  Lab 10/27/19 1449 10/28/19 0445 10/28/19 0842  INR 1.9* 2.4* 1.9*    No  results for input(s): DDIMER in the last 72 hours.  Cardiac Enzymes No results for input(s): CKMB, TROPONINI, MYOGLOBIN in the last 168 hours.  Invalid input(s): CK ------------------------------------------------------------------------------------------------------------------    Component Value Date/Time   BNP 3,772.0 (H) 08/05/2019 0431    Inpatient Medications  Scheduled Meds: . [MAR Hold] carvedilol  25 mg Oral BID WC  . [MAR Hold] chlorhexidine gluconate (MEDLINE KIT)  15 mL Mouth Rinse BID  . [MAR Hold] Chlorhexidine Gluconate Cloth  6 each Topical Daily  . [MAR Hold] epoetin (EPOGEN/PROCRIT) injection  10,000 Units Intravenous Q T,Th,Sa-HD  . [MAR Hold] hydrocortisone sod succinate (SOLU-CORTEF) inj  50 mg Intravenous Q6H  . [MAR Hold] insulin aspart  0-9 Units Subcutaneous Q6H  . [MAR Hold] mouth rinse  15 mL Mouth Rinse QID  . mupirocin ointment  1 application Nasal BID  . [MAR Hold] pantoprazole sodium  40 mg Per Tube QHS  . [MAR Hold] sodium chloride flush  10-40 mL Intracatheter Q12H   Continuous Infusions: . [MAR Hold] sodium chloride Stopped (11/01/19 0138)  . argatroban 0.1 mcg/kg/min (11/01/19 1000)  . [MAR Hold] ceFEPime (MAXIPIME) IV 1 g (11/02/19 0523)  . [MAR Hold] citrate dextrose    . [MAR Hold] doxycycline (VIBRAMYCIN) IV 100 mg (11/02/19 1030)  . [MAR Hold] vancomycin     PRN Meds:.[MAR Hold] sodium chloride, [MAR Hold] acetaminophen **OR** [MAR Hold] acetaminophen, [MAR Hold] bisacodyl, [MAR Hold] citrate dextrose, [MAR Hold] fentaNYL (SUBLIMAZE) injection, [MAR Hold] hydrALAZINE, [MAR Hold] LORazepam, [MAR Hold] magnesium citrate, [MAR Hold] ondansetron **OR** [MAR Hold] ondansetron (ZOFRAN) IV, [MAR Hold] polyethylene glycol, [MAR Hold] sodium chloride flush, [MAR Hold] traMADol  Micro Results Recent Results (from the past 240 hour(s))  Blood Culture (routine x 2)     Status: Abnormal   Collection Time: 10/27/19  1:07 PM   Specimen: BLOOD  Result  Value Ref Range Status   Specimen Description  Final    BLOOD BLOOD RIGHT HAND Performed at [facility], [address] [place], [place] [address]    Special Requests   Final    BOTTLES DRAWN AEROBIC AND ANAEROBIC Blood Culture results may not be optimal due to an inadequate volume of blood received in culture bottles Performed at [facility], [address] [place], [place] [address]    Culture  Setup Time   Final    GRAM POSITIVE RODS AEROBIC BOTTLE ONLY CRITICAL RESULT CALLED TO, READ BACK BY AND VERIFIED WITH: SCOTT HALL 10/28/19 @ 71  [place] Performed at [facility], [address] [place], [place] [address]    Culture CLOSTRIDIUM SPECIES (A)  Final   Report Status 10/31/2019 FINAL  Final  Blood Culture (routine x 2)     Status: None   Collection Time: 10/27/19  1:07 PM   Specimen: BLOOD  Result Value Ref Range Status   Specimen Description BLOOD BLOOD LEFT HAND  Final   Special Requests   Final    BOTTLES DRAWN AEROBIC ONLY Blood Culture results may not be optimal due to an inadequate volume of blood received in culture bottles   Culture   Final    NO GROWTH 5 DAYS Performed at [facility], [address] [place], [place] [address]    Report Status 11/01/2019 FINAL  Final  SARS CORONAVIRUS 2 (TAT 6-24 HRS) Nasopharyngeal Nasopharyngeal Swab     Status: None   Collection Time: 10/27/19  3:54 PM   Specimen: Nasopharyngeal Swab  Result Value Ref Range Status   SARS Coronavirus 2 NEGATIVE NEGATIVE Final    Comment: (NOTE) SARS-CoV-2 target nucleic acids are NOT DETECTED. The SARS-CoV-2 RNA is generally detectable in upper and lower respiratory specimens during the acute phase of infection. Negative results do not preclude SARS-CoV-2 infection, do not rule out co-infections with other pathogens, and should not be used as the sole basis for treatment or other patient management decisions. Negative results must be combined with  clinical observations, patient history, and epidemiological information. The expected result is Negative. Fact Sheet for Patients: SugarRoll.be Fact Sheet for Healthcare Providers: https://www.woods-mathews.com/ This test is not yet approved or cleared by the [place] FDA and  has been authorized for detection and/or diagnosis of SARS-CoV-2 by FDA under an Emergency Use Authorization (EUA). This EUA will remain  in effect (meaning this test can be used) for the duration of the COVID-19 declaration under Section 56 4(b)(1) of the Act, 21 U.S.C. section 360bbb-3(b)(1), unless the authorization is terminated or revoked sooner. Performed at [facility], [place] [address] [place], [place] [address]   MRSA PCR Screening     Status: Abnormal   Collection Time: 10/27/19  9:52 PM   Specimen: Nasopharyngeal  Result Value Ref Range Status   MRSA by PCR POSITIVE (A) NEGATIVE Final    Comment:        The GeneXpert MRSA Assay (FDA approved for NASAL specimens only), is one component of a comprehensive MRSA colonization surveillance program. It is not intended to diagnose MRSA infection nor to guide or monitor treatment for MRSA infections. RESULT CALLED TO, READ BACK BY AND VERIFIED WITH: Shary Key RN [phone] 10/27/19 HNM Performed at [facility], [address] [place],  [address]     Radiology Reports EEG  Result Date: 10/28/2019 Lora Havens, MD     10/28/2019  3:18 PM Patient Name: Chaim Gatley MRN: [id number] Epilepsy Attending: Marcelle Overlie  Barbra Sarks Referring Physician/Provider: Darel Hong, NP Date: 10/27/2018 Duration: 27.57 minutes Patient history: 44 year old male with history of seizure disorder now status post cardiac arrest.  EEG to evaluate for seizures Level of alertness: Comatose/sedated AEDs during EEG study: None Technical aspects: This EEG study was done with scalp electrodes positioned according to the  10-20 International system of electrode placement. Electrical activity was acquired at a sampling rate of '500Hz'  and reviewed with a high frequency filter of '70Hz'  and a low frequency filter of '1Hz' . EEG data were recorded continuously and digitally stored. Description: EEG showed continuous generalized, maximal bitemporal 3 to 6 Hz theta-delta slowing.  EEG was reactive to noxious stimuli.  Hyperventilation photic summation were not performed. Abnormality -Continuous slow, generalized, maximal bitemporal IMPRESSION: This study is suggestive of nonspecific bitemporal cortical dysfunction as well as severe diffuse encephalopathy, nonspecific to etiology but could be secondary to sedation. No seizures or epileptiform discharges were seen throughout the recording. Lora Havens   DG Chest 1 View  Result Date: 10/27/2019 CLINICAL DATA:  Difficult intubation status post CPR EXAM: CHEST  1 VIEW COMPARISON:  October 27, 2019 at 12:19 p.m. FINDINGS: The heart size remains enlarged. There is a small to moderate-sized left-sided pleural effusion. There is a small right-sided pleural effusion. There is diffuse increased attenuation within the left lung field. Prominent interstitial lung markings are noted. There is a stable tunneled dialysis catheter on the right. The endotracheal tube terminates approximately 3 cm above the carina. There is no pneumothorax. IMPRESSION: 1. Lines and tubes as above. 2. Persistent bilateral pleural effusions, left greater than right. 3. Worsening left-sided airspace disease concerning for pneumonia. Electronically Signed   By: Constance Holster M.D.   On: 10/27/2019 20:07   DG Chest 2 View  Result Date: 10/27/2019 CLINICAL DATA:  44 year old male with history of chest pain. Vomiting for the past 2 weeks. Diarrhea since yesterday. EXAM: CHEST - 2 VIEW COMPARISON:  Chest x-ray 10/03/2019. FINDINGS: Right internal jugular PermCath with tips terminating in the right atrium and superior  cavoatrial junction. Left upper extremity vascular stent extending into the axillary region. Lung volumes are slightly low. Opacity at the left base may reflect atelectasis and/or consolidation. Small to moderate left pleural effusion, slightly decreased compared to the prior study. Right lung is clear. No right pleural effusion. No pneumothorax. No definite suspicious appearing pulmonary nodules or masses. No evidence of pulmonary edema. Mild cardiomegaly. Upper mediastinal contours are within normal limits. IMPRESSION: 1. Support apparatus and postoperative changes, as above. 2. Left lower lobe atelectasis and/or consolidation with superimposed small to moderate left pleural effusion, slightly decreased compared to the prior study. Electronically Signed   By: Vinnie Langton M.D.   On: 10/27/2019 12:36   DG Chest 2 View  Result Date: 10/03/2019 CLINICAL DATA:  Chest pressure since yesterday. States has had cough for one week. Was tested for COVID yesterday but does not have result. DIalysis patient with permacath R upper chest. Hx - ESRD, HTN, AAA, current smoker 0.5 ppd. EXAM: CHEST - 2 VIEW COMPARISON:  08/25/2019.  CT, 07/20/2019. FINDINGS: Cardiac silhouette is mildly enlarged. No mediastinal or hilar masses. Right internal jugular tunneled dual lumen central venous catheter is stable, distal tip in the right atrium. Moderate left pleural effusion associated with dependent left lung base opacity, the latter most likely atelectasis although pneumonia is possible. Lungs show prominent bronchovascular markings bilaterally, but are otherwise clear. No pulmonary edema. No pneumothorax. Skeletal structures are grossly intact. Stable left  axillary to upper extremity vascular stent. IMPRESSION: 1. Moderate right pleural effusion, which appears decreased compared to the prior chest radiograph. 2. Left lung base opacity, which is most likely atelectasis. Pneumonia is not excluded but felt less likely. 3. No  evidence of pulmonary edema.  Mild cardiomegaly. Electronically Signed   By: Lajean Manes M.D.   On: 10/03/2019 12:58   DG Ankle Complete Right  Result Date: 10/27/2019 CLINICAL DATA:  Chronic wound with cellulitis EXAM: RIGHT ANKLE - COMPLETE 3+ VIEW COMPARISON:  None. FINDINGS: Frontal, oblique, and lateral views obtained. There is evidence of old trauma involving the calcaneus with remodeling. No acute fracture or joint effusion is evident. There is narrowing of the posterior aspect of the ankle joint. There is arthropathy throughout the subtalar joint regions. No frank erosion or bony destruction evident. IMPRESSION: Evidence of old trauma involving the calcaneus. Arthropathy throughout the subtalar joints. There is narrowing of the posterior ankle joint. No frank bony destruction appreciated by radiography. No acute fracture or joint effusion evident. If there remains concern for osteomyelitis, MR or nuclear medicine three-phase bone scan could be helpful for further assessment. Electronically Signed   By: Lowella Grip III M.D.   On: 10/27/2019 13:09   DG Abd 1 View  Result Date: 10/29/2019 CLINICAL DATA:  Orogastric tube placement EXAM: ABDOMEN - 1 VIEW COMPARISON:  Portable exam 1328 hours compared to 10/28/2019 FINDINGS: Nasogastric tube coiled in proximal stomach. Tips of dual lumen RIGHT jugular central venous catheter projects over cavoatrial junction and upper RIGHT atrium. Tip of endotracheal tube projects approximately 3.9 cm above carina. Enlargement of cardiac silhouette with atelectasis versus consolidation of LEFT lower lobe and associated small LEFT pleural effusion. Vascular stents identified in upper abdomen. IMPRESSION: Nasogastric tube coiled in proximal stomach. Persistent atelectasis versus consolidation LEFT lower lobe with associated small LEFT pleural effusion. Electronically Signed   By: Lavonia Dana M.D.   On: 10/29/2019 13:45   DG Abd 1 View  Result Date:  10/28/2019 CLINICAL DATA:  OG tube placement. EXAM: ABDOMEN - 1 VIEW COMPARISON:  Single-view of the abdomen 10/04/2019. FINDINGS: OG tube is in place with the tip in the distal stomach. IMPRESSION: As above. Electronically Signed   By: Inge Rise M.D.   On: 10/28/2019 11:56   DG Abd 1 View  Result Date: 10/04/2019 CLINICAL DATA:  NG tube placement EXAM: ABDOMEN - 1 VIEW COMPARISON:  10/04/2019 FINDINGS: Interval placement of esophagogastric tube, tip near the gastroesophageal junction and side port above the diaphragm. Recommend advancement to ensure subdiaphragmatic positioning of tip and side port. Nonobstructive pattern of bowel gas in the included abdomen. No obvious free air. Stent projects over the central abdomen. Cardiomegaly. IMPRESSION: Interval placement of esophagogastric tube, tip near the gastroesophageal junction and side port above the diaphragm. Recommend advancement to ensure subdiaphragmatic positioning of tip and side port. Electronically Signed   By: Eddie Candle M.D.   On: 10/04/2019 17:10   CT HEAD WO CONTRAST  Result Date: 10/28/2019 CLINICAL DATA:  Cardiac arrest. EXAM: CT HEAD WITHOUT CONTRAST TECHNIQUE: Contiguous axial images were obtained from the base of the skull through the vertex without intravenous contrast. COMPARISON:  None. FINDINGS: Brain: There is no mass, hemorrhage or extra-axial collection. The size and configuration of the ventricles and extra-axial CSF spaces are normal. The brain parenchyma is normal, without acute or chronic infarction. Vascular: No abnormal hyperdensity of the major intracranial arteries or dural venous sinuses. No intracranial atherosclerosis. Skull: The visualized skull  base, calvarium and extracranial soft tissues are normal. Sinuses/Orbits: No fluid levels or advanced mucosal thickening of the visualized paranasal sinuses. No mastoid or middle ear effusion. The orbits are normal. IMPRESSION: Normal head CT. Electronically Signed   By:  Ulyses Jarred M.D.   On: 10/28/2019 01:50   US Venous Img Upper Uni Right(DVT)  Result Date: 10/28/2019 CLINICAL DATA:  Dialysis patient, right arm graft removed, skin changes and swelling. EXAM: RIGHT UPPER EXTREMITY VENOUS DOPPLER ULTRASOUND TECHNIQUE: Gray-scale sonography with graded compression, as well as color Doppler and duplex ultrasound were performed to evaluate the upper extremity deep venous system from the level of the subclavian vein and including the jugular, axillary, basilic, radial, ulnar and upper cephalic vein. Spectral Doppler was utilized to evaluate flow at rest and with distal augmentation maneuvers. COMPARISON:  None. FINDINGS: Contralateral Subclavian Vein: Respiratory phasicity is normal and symmetric with the symptomatic side. No evidence of thrombus. Normal compressibility. Internal Jugular Vein: No evidence of thrombus. Normal compressibility, respiratory phasicity and response to augmentation. Subclavian Vein: No evidence of thrombus. Normal compressibility, respiratory phasicity and response to augmentation. Axillary Vein: No evidence of thrombus. Normal compressibility, respiratory phasicity and response to augmentation. Cephalic Vein: No evidence of thrombus. Normal compressibility, respiratory phasicity and response to augmentation. Basilic Vein: No evidence of thrombus. Normal compressibility, respiratory phasicity and response to augmentation. Brachial Veins: No evidence of thrombus. Normal compressibility, respiratory phasicity and response to augmentation. Radial Veins: The forearm radial vein demonstrates hypoechoic thrombus appearing occlusive. Vein noncompressible. Minor thrombus burden without propagation into the brachial vein of the upper arm. Ulnar Veins: No evidence of thrombus. Normal compressibility, respiratory phasicity and response to augmentation. IMPRESSION: Right forearm radial vein DVT. Very low thrombus burden. No propagation into the brachial vein.  Electronically Signed   By: Jerilynn Mages.  Shick M.D.   On: 10/28/2019 10:31   DG Chest Port 1 View  Result Date: 10/28/2019 CLINICAL DATA:  Acute respiratory failure EXAM: PORTABLE CHEST 1 VIEW COMPARISON:  Radiograph 10/27/2019 FINDINGS: Dual lumen right IJ approach dialysis catheter tip terminates at the level of the right atrium and superior cavoatrial junction. An endotracheal tube terminates low in the trachea, approximately 1 cm from the carina. Pacer pad overlies the left chest wall. Additional support devices project over the chest. Left axillary vascular stent is again seen. Lung volumes remain diminished with bilateral patchy airspace opacities similar on the left and slightly increasing on the right when compared to prior imaging. No pneumothorax. Suspect left pleural effusion. No visible right effusion. No acute osseous or soft tissue abnormality. IMPRESSION: 1. Endotracheal tube terminates low in the trachea, approximately 1 cm from the carina. Consider retraction 2-3 cm to position in the mid trachea. 2. Bilateral patchy airspace opacities similar on the left and slightly increasing on the right when compared to prior imaging. Worrisome for infectious consolidation versus edema. 3. Left pleural effusion. These results will be called to the ordering clinician or representative by the Radiologist Assistant, and communication documented in the PACS or zVision Dashboard. Electronically Signed   By: Lovena Le M.D.   On: 10/28/2019 03:13   DG Abd 2 Views  Result Date: 10/04/2019 CLINICAL DATA:  Vomiting. EXAM: ABDOMEN - 2 VIEW COMPARISON:  08/25/2019 FINDINGS: There is no bowel dilation to suggest obstruction. No air-fluid levels or free air. Vascular stent overlies the upper to mid lumbar spine. A right common iliac stent is present. These are stable from prior study. There are surgical vascular clips in the  central abdomen and left lower quadrant. These are also unchanged. No evidence of renal or ureteral  stones. There is opacity at the left lung base, incompletely imaged. Skeletal structures unremarkable. IMPRESSION: 1. No evidence of bowel obstruction or free air.  No acute findings. Electronically Signed   By: Lajean Manes M.D.   On: 10/04/2019 16:11   ECHOCARDIOGRAM COMPLETE  Result Date: 10/29/2019   ECHOCARDIOGRAM REPORT   Patient Name:   Brett Small Date of Exam: 10/29/2019 Medical Rec #:  [id number]      Height:       61.0 in Accession #:    [id number]     Weight:       100.3 lb Date of Birth:  01-Apr-1976      BSA:          1.41 m Patient Age:    58 years       BP:           None listed/None listed mmHg Patient Gender: M              HR:           74 bpm. Exam Location:  [facility] Procedure: 2D Echo, Cardiac Doppler and Color Doppler Indications:     bacteremia 790.7  History:         Patient has prior history of Echocardiogram examinations, most                  recent 08/04/2019. Risk Factors:Hypertension. PAD.  Sonographer:     Sherrie Sport RDCS (AE) Referring Phys:  [id number] [name] Diagnosing Phys: Bartholome Bill MD IMPRESSIONS  1. Left ventricular ejection fraction, by visual estimation, is 25 to 30%. The left ventricle has moderate to severely decreased function. Left ventricular septal wall thickness was mildly increased. Mildly increased left ventricular posterior wall thickness. There is mildly increased left ventricular hypertrophy.  2. Left ventricular diastolic parameters are consistent with Grade I diastolic dysfunction (impaired relaxation).  3. The left ventricle demonstrates global hypokinesis.  4. Global right ventricle has normal systolic function.The right ventricular size is mildly enlarged. No increase in right ventricular wall thickness.  5. Left atrial size was mildly dilated.  6. Right atrial size was mildly dilated.  7. Moderate pleural effusion in the left lateral region.  8. The pericardial effusion is circumferential.  9. Trivial pericardial effusion is present. 10. The mitral  valve is grossly normal. Mild mitral valve regurgitation. 11. The tricuspid valve is grossly normal. 12. The aortic valve is tricuspid. Aortic valve regurgitation is trivial. 13. The pulmonic valve was not well visualized. Pulmonic valve regurgitation is trivial. 14. Moderately elevated pulmonary artery systolic pressure. FINDINGS  Left Ventricle: Left ventricular ejection fraction, by visual estimation, is 25 to 30%. The left ventricle has moderate to severely decreased function. The left ventricle demonstrates global hypokinesis. Mildly increased left ventricular posterior wall thickness. There is mildly increased left ventricular hypertrophy. Left ventricular diastolic parameters are consistent with Grade I diastolic dysfunction (impaired relaxation). Right Ventricle: The right ventricular size is mildly enlarged. No increase in right ventricular wall thickness. Global RV systolic function is has normal systolic function. The tricuspid regurgitant velocity is 2.79 m/s, and with an assumed right atrial  pressure of 10 mmHg, the estimated right ventricular systolic pressure is moderately elevated at 41.1 mmHg. Left Atrium: Left atrial size was mildly dilated. Right Atrium: Right atrial size was mildly dilated Pericardium: Trivial pericardial effusion is present. The pericardial effusion is circumferential.  There is no evidence of cardiac tamponade. There is a moderate pleural effusion in the left lateral region. Mitral Valve: The mitral valve is grossly normal. Mild mitral valve regurgitation. There is no evidence of mitral valve vegetation. Tricuspid Valve: The tricuspid valve is grossly normal. Tricuspid valve regurgitation is mild. Aortic Valve: The aortic valve is tricuspid. Aortic valve regurgitation is trivial. Aortic valve mean gradient measures 2.0 mmHg. Aortic valve peak gradient measures 4.2 mmHg. Aortic valve area, by VTI measures 1.81 cm. Pulmonic Valve: The pulmonic valve was not well visualized.  Pulmonic valve regurgitation is trivial. Pulmonic regurgitation is trivial. Aorta: The aortic root is normal in size and structure. IAS/Shunts: No atrial level shunt detected by color flow Doppler.  LEFT VENTRICLE PLAX 2D LVIDd:         5.16 cm       Diastology LVIDs:         4.48 cm       LV e' lateral:   6.42 cm/s LV PW:         0.91 cm       LV E/e' lateral: 11.0 LV IVS:        0.99 cm       LV e' medial:    4.57 cm/s LVOT diam:     2.00 cm       LV E/e' medial:  15.4 LV SV:         36 ml LV SV Index:   25.63 LVOT Area:     3.14 cm  LV Volumes (MOD) LV area d, A4C:    22.10 cm LV area s, A4C:    20.70 cm LV major d, A4C:   5.96 cm LV major s, A4C:   6.08 cm LV vol d, MOD A4C: 67.5 ml LV vol s, MOD A4C: 58.7 ml LV SV MOD A4C:     67.5 ml RIGHT VENTRICLE RV Basal diam:  3.07 cm RV S prime:     8.49 cm/s TAPSE (M-mode): 2.8 cm LEFT ATRIUM           Index       RIGHT ATRIUM           Index LA diam:      3.35 cm 2.38 cm/m  RA Area:     16.40 cm LA Vol (A4C): 30.0 ml 21.30 ml/m RA Volume:   45.00 ml  31.95 ml/m  AORTIC VALVE                   PULMONIC VALVE AV Area (Vmax):    1.71 cm    PV Vmax:        0.47 m/s AV Area (Vmean):   1.89 cm    PV Peak grad:   0.9 mmHg AV Area (VTI):     1.81 cm    RVOT Peak grad: 3 mmHg AV Vmax:           103.00 cm/s AV Vmean:          63.450 cm/s AV VTI:            0.138 m AV Peak Grad:      4.2 mmHg AV Mean Grad:      2.0 mmHg LVOT Vmax:         56.20 cm/s LVOT Vmean:        38.200 cm/s LVOT VTI:          0.079 m LVOT/AV VTI ratio: 0.58  AORTA Ao Root diam:  2.60 cm MITRAL VALVE                        TRICUSPID VALVE MV Area (PHT): 3.89 cm             TR Peak grad:   31.1 mmHg MV PHT:        56.55 msec           TR Vmax:        289.00 cm/s MV Decel Time: 195 msec MV E velocity: 70.30 cm/s 103 cm/s  SHUNTS MV A velocity: 36.40 cm/s 70.3 cm/s Systemic VTI:  0.08 m MV E/A ratio:  1.93       1.5       Systemic Diam: 2.00 cm  Bartholome Bill MD Electronically signed by Bartholome Bill MD  Signature Date/Time: 10/29/2019/10:46:08 AM    Final     Time Spent in minutes 35   Leiyah Maultsby M.D on 11/02/2019 at 11:30 AM  Between 7am to 7pm - Pager - [phone]  After 7pm go to [email or web address] - password [email or web address]  [facility] -  Office  [phone]

## 2019-11-02 NOTE — Progress Notes (Signed)
PT Cancellation Note  Patient Details Name: Brett Small MRN: [id number] DOB: [birth year]   Cancelled Treatment:    Reason Eval/Treat Not Completed: Medical issues which prohibited therapy(RN reports pt scheduled for angiogram today, asks PT hold off at this time. Will need an updated order after procedure.)  10:03 AM, 11/02/19 Etta Grandchild, PT, DPT Physical Therapist - [facility]  [phone] ([place])    Aragon Scarantino C 11/02/2019, 10:03 AM

## 2019-11-02 NOTE — Op Note (Signed)
OPERATIVE REPORT   PREOPERATIVE DIAGNOSIS: 1. End-stage renal disease. 2.  Right upper extremity ischemia  POSTOPERATIVE DIAGNOSIS: Same as above  PROCEDURE PERFORMED: 1. Ultrasound guidance vascular access to right femoral artery.  Ultrasound guidance for vascular access to right ulnar artery 2. Catheter placement to the right ulnar artery  from right femoral approach. 3.  Catheter placement to right brachial artery from right ulnar approach 4. Thoracic aortogram and selective right upper extremity angiogram  including selective images of the ulnar artery. 5.  Mechanical thrombectomy of the right brachial artery and right ulnar artery with the penumbra cat 6 device 6.  Percutaneous transluminal angioplasty of the right ulnar artery with 3 mm diameter angioplasty balloon 7.  Stent placement to the right brachial artery with 6 mm diameter by 5 cm length Viabahn stent 8. StarClose closure device right femoral artery.  SURGEON: Algernon Huxley, MD  ANESTHESIA: Local with moderate conscious sedation for 90 minutes using 3 mg of Versed and 100 mcg of Fentanyl (for both procedures)  BLOOD LOSS:200 cc  FLUOROSCOPY TIME:25.2  INDICATION FOR PROCEDURE: This is a 44 y.o.male who presented [facility] hospital with cardiac arrest and was noted to have right upper extremity ischemia.  Once he was stabilized enough he was brought down to angiography for evaluation for revascularization. Risks and benefits are discussed. Informed consent was obtained.  DESCRIPTION OF PROCEDURE: The patient was brought to the vascular suite. Moderate conscious sedation was administered during a face to face encounter with the patient throughout the procedure with my supervision of the RN administering medicines and monitoring the patient's vital signs, pulse oximetry, telemetry and mental status throughout from the start of the procedure until the patient was taken to the recovery room.  Groins  were shaved and prepped and sterile surgical field was created. The right femoral head was localized with fluoroscopy and the right femoral artery was then visualized with ultrasound and found to be widely patent. It was then accessed under direct ultrasound guidance without difficulty with a Seldinger needle and a permanent image was recorded. A J-wire and 5-French sheath were then placed. Pigtail catheter was placed into the ascending aorta and a thoracic aortogram was then performed in the LAO projection. This demonstrated normal origins to the great vessels without significant proximal stenoses and a normal configuration of the great vessels. The headhunter catheter was used to selectively cannulate the innominate artery and then advanced into the right subclavian artery without difficulty. This was then sequentially advanced to the brachial artery.  Imaging was performed showing occlusion of the brachial artery in the proximal portions of the ulnar and radial arteries.  The ulnar artery was the only vessel was then continuous distally and it was extremely sluggish.  I then exchanged for a long 6 French sheath and started with an advantage wire to get down to the area of occlusion.  The penumbra cat 6 device was used to make passes in the brachial artery but were never able to advance from this antegrade fashion down into the ulnar or radial arteries.  Only a small amount of thrombus was removed.  Multiple catheters and wires were used to try to get across the occlusion and down into the ulnar artery without success.  I then elected to selectively access the right ulnar artery as the best runoff distally.  Ultrasound was used to visualize the ulnar artery where it was patent at the wrist with a micropuncture needle and a permanent image was recorded.  A micropuncture wire and sheath were then placed and we upsized to a 5 French slender sheath.  Using a 40 cm Kumpe catheter and 0.018 advantage  wire I was able to easily cross the occlusion from this retrograde approach and confirm flow in the brachial artery proximal to the occlusion.  The wire was then replaced.  A 3 mm diameter by 10 cm length angioplasty balloon was used to treat the right ulnar artery and inflated to 6 atm for 1 minute.  He was advanced to predilate the right brachial artery and inflated to 14 atm 1 minute there remained thrombus in this area and the penumbra cat 6 device was run from the femoral approach in both the right brachial and right ulnar arteries with resolution of the thrombus.  The ulnar artery was now patent with less than 20% residual stenosis, but the brachial artery had a persistent high-grade stenosis even after thrombectomy.  This was an area of a previous arterial repair and there had been small amounts of extravasation during attempts to cross from the antegrade approach so I elected to place a covered stent.  After angioplasty from the ulnar approach I was able to easily pass a wire and catheter down into the right ulnar artery across the stenosis from the femoral approach.  The wire from the ulnar artery was removed.  A 6 mm diameter by 5 cm length Viabahn stent was then deployed taking care to stay above the brachial bifurcation and below the large collateral that was providing flow distally.  This was post dilated with a 4 then a 5 mm high pressure balloon with less than ten percent residual stenosis present in the brachial artery. The ulnar artery remained the dominant runoff to the hand but the presence of the sheath made this a little sluggish. The ulnar sheath was removed and a TR band was placed. The diagnostic catheter was removed. Oblique arteriogram was performed of the right femoral artery and StarClose closure device deployed in the usual fashion with excellent hemostatic result. The patient tolerated the procedure well and was taken to the recovery room in stable condition.   Jason  Dew 11/02/2019 2:52 PM 

## 2019-11-02 NOTE — Progress Notes (Signed)
Nutrition Follow Up Note   DOCUMENTATION CODES:   Non-severe (moderate) malnutrition in context of chronic illness  INTERVENTION:   Nepro Shake po BID, each supplement provides 425 kcal and 19 grams protein  Rena-vite daily   NUTRITION DIAGNOSIS:   Moderate Malnutrition related to chronic illness(ESRD on HD) as evidenced by moderate fat depletion, moderate muscle depletion, severe muscle depletion.  GOAL:   Patient will meet greater than or equal to 90% of their needs  -progressing   MONITOR:   PO intake, Supplement acceptance, Labs, Weight trends, Skin, I & O's  ASSESSMENT:   44 year old male with PMHx of ESRD on HD, HTN, secondary hyperparathyroidism, PVD, PAD who was admitted with sepsis and PNA, suffered two cardiac arrests, required intubation, also with septic and cardiogenic shock, hyperkalemia after missing HD.   Pt with improved appetite and oral intake. Pt NPO today for angiogram but documented to be eating 100% of meals yesterday. RD will add supplements and vitamins to help pt meet his estimated needs. Per chart, pt is down ~5lbs(5%) since admit. RD will continue to monitor.   Medications reviewed and include: epoetin, insulin, protonix, cefepime, doxycycline, vancomycin    Labs reviewed: creat 1.34(H), P 2.4(L), Mg 2.0 wnl Wbc- 12.1(H), Hgb 8.5(L), Hct 28.8(L) cbgs- 125, 114, 165, 123, 160 x 24 hrs  Diet Order:   Diet Order            Diet NPO time specified Except for: Sips with Meds  Diet effective midnight             EDUCATION NEEDS:   No education needs have been identified at this time  Skin:  Skin Assessment: Reviewed RN Assessment  Last BM:  1/11  Height:   Ht Readings from Last 1 Encounters:  11/02/19 5\' 1"  (1.549 m)   Weight:   Wt Readings from Last 1 Encounters:  11/02/19 44.5 kg   Ideal Body Weight:  50.9 kg  BMI:  Body mass index is 18.54 kg/m.  Estimated Nutritional Needs:   Kcal:  1600-1800kcal/day  Protein:   75-85g/day  Fluid:  UOP +1L  Koleen Distance MS, RD, LDN Pager #- [phone] Office#- [phone] After Hours Pager: [phone]

## 2019-11-02 NOTE — Progress Notes (Addendum)
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  Patient's is in critical status with multiorgan failure, poor prognosis, and he has high risk for subsequent cardiac arrest. 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 HIT antibody negative, SRA pending  Goal of Therapy:  Monitor platelets by anticoagulation protocol: Yes  Target aPTT 50-90 seconds  Course of Therapy Argatroban initiated at 0.5 mcg/kg/min for ICU patients per protocol. 0106 1137 aPTT 113s: dec infusion to 0.35 mcgkg/min 0106 1443 aPTT 107s: dec infusion to 0.30 mcg/kg/min 0106 2206 aPTT 92s: no change 0107 0206 aPTT 110s: dec infusion to 0.20 mcg/kg/min 0107 0726 aPTT 108s: dec infusion to 0.15 mcg/kg/min 0107 1241 aPTT 94s: dec infusion to 0.10 mcg/kg/min 0107 1815 aPTT 88s: continue infusion at 0.10 mcg/kg/min 0107 2200 aPTT 75s: continue infusion at 0.3mcg/kg/min 0108 0943 aPTT 76s: continue infusion at 0.70mcg/kg/min 0109 0532 aPTT 51s: continue infusion at 0.10 mcg/kg/min  0109 1609 aPTT 60s: continue infusion at 0.10 mcg/kg/min 0110 0407 aPTT 62s: continue infusion at 0.10 mcg/kg/min. 0111 0726 aPTT 55s  Plan:   Continue argatroban at current rate (0.10 mcg/kg/min). No s/sx of bleeding noted per RN.   Will recheck aPTT and CBC with AM labs  Pharmacy will continue to follow.   Rocky Morel, PharmD, BCPS 11/02/2019 8:14 AM

## 2019-11-02 NOTE — Plan of Care (Signed)

## 2019-11-02 NOTE — Progress Notes (Signed)
[facility]  ROUNDING NOTE   Subjective:   Continues to complain of right upper extremity pain.   Angiogram for later today.   Objective:  Vital signs in last 24 hours:  Temp:  [97.6 F (36.4 C)-97.9 F (36.6 C)] 97.9 F (36.6 C) (01/11 1044) Pulse Rate:  [78-96] 84 (01/11 1530) Resp:  [9-17] 15 (01/11 1530) BP: (81-121)/(61-89) 119/82 (01/11 1530) SpO2:  [91 %-100 %] 91 % (01/11 1530) Weight:  [44.5 kg] 44.5 kg (01/11 1044)  Weight change:  Filed Weights   10/31/19 0500 11/01/19 0500 11/02/19 1044  Weight: 44.5 kg 44.5 kg 44.5 kg    Intake/Output: I/O last 3 completed shifts: In: 1154.3 [P.O.:240; I.V.:59.3; IV Piggyback:855] Out: 1150 [Urine:50; Emesis/NG output:100; Other:1000]   Intake/Output this shift:  No intake/output data recorded.  Physical Exam: General: NAD, laying in bed  Head: Normocephalic, atraumatic. Moist oral mucosal membranes  Eyes: Anicteric, PERRL  Neck: Supple, trachea midline  Lungs:  Clear to auscultation  Heart: tachycardia  Abdomen:  Soft, nontender,   Extremities: Right upper extremity tenderness and edema  Neurologic: Nonfocal, moving all four extremities  Skin: No lesions  Access: RIJ permcath, Clotted left AVG    Basic Metabolic Panel: Recent Labs  Lab 10/28/19 0445 10/29/19 0206 10/30/19 0430 10/30/19 1921 10/31/19 0532 10/31/19 1609 11/01/19 0407  NA 132*  --  136 134* 135 136 138  K 4.7  --  4.6 4.4 4.6 4.5 4.0  CL 87*  --  103 104 103 102 102  CO2 30  --  24 21* _0 GLUCOSE 125*  --  135* 144* 128* 154* 110*  BUN 72*  --  22* 21* 18 25* 14  CREATININE 5.80*  --  1.29* 1.20 1.11 1.83* 1.34*  CALCIUM 8.2*  --  7.6* 7.8* 8.2* 8.6* 8.6*  MG 1.8 2.4 2.2  --  2.3  --  2.0  PHOS 8.3*  --  2.2* 1.6* 1.8* 4.0 2.4*    Liver Function Tests: Recent Labs  Lab 10/27/19 1823 10/28/19 0445 10/30/19 0430 10/30/19 1921 10/31/19 0532 10/31/19 1609 11/01/19 0407  AST 29 37  --   --   --   --   --    ALT 14 19  --   --   --   --   --   ALKPHOS 63 58  --   --   --   --   --   BILITOT 1.9* 2.5*  --   --   --   --   --   PROT 5.0* 4.3*  --   --   --   --   --   ALBUMIN 2.1* 2.0* 2.1* 2.1* 2.4* 2.5* 2.5*   No results for input(s): LIPASE, AMYLASE in the last 168 hours. No results for input(s): AMMONIA in the last 168 hours.  CBC: Recent Labs  Lab 10/28/19 0756 10/30/19 0430 10/31/19 0050 10/31/19 0532 11/01/19 0407 11/02/19 0726  WBC 8.7 10.9* 12.8* 13.0* 18.1* 12.1*  NEUTROABS 7.7  --   --   --   --   --   HGB 7.3* 7.5* 6.5* 8.7* 9.2* 8.5*  HCT 22.4* 25.2* 22.7* 28.5* 30.0* 28.8*  MCV 91.8 95.8 99.1 94.1 94.9 97.0  PLT 50* 36* 58* 62* 100* 89*    Cardiac Enzymes: No results for input(s): CKTOTAL, CKMB, CKMBINDEX, TROPONINI in the last 168 hours.  BNP: Invalid input(s): POCBNP  CBG: Recent Labs  Lab 11/01/19 1110  11/01/19 1652 11/01/19 2119 11/01/19 2358 11/02/19 [place]* 160*    Microbiology: Results for orders placed or performed during the hospital encounter of 10/27/19  Blood Culture (routine x 2)     Status: Abnormal   Collection Time: 10/27/19  1:07 PM   Specimen: BLOOD  Result Value Ref Range Status   Specimen Description   Final    BLOOD BLOOD RIGHT HAND Performed at [facility], [address], [place], [place] [address]    Special Requests   Final    BOTTLES DRAWN AEROBIC AND ANAEROBIC Blood Culture results may not be optimal due to an inadequate volume of blood received in culture bottles Performed at [facility], [address], [place], [place] [address]    Culture  Setup Time   Final    GRAM POSITIVE RODS AEROBIC BOTTLE ONLY CRITICAL RESULT CALLED TO, READ BACK BY AND VERIFIED WITH: SCOTT HALL 10/28/19 @ 2210  [place] Performed at [facility], [address] [place], [place] [address]    Culture CLOSTRIDIUM SPECIES (A)  Final   Report Status 10/31/2019 FINAL  Final  Blood Culture  (routine x 2)     Status: None   Collection Time: 10/27/19  1:07 PM   Specimen: BLOOD  Result Value Ref Range Status   Specimen Description BLOOD BLOOD LEFT HAND  Final   Special Requests   Final    BOTTLES DRAWN AEROBIC ONLY Blood Culture results may not be optimal due to an inadequate volume of blood received in culture bottles   Culture   Final    NO GROWTH 5 DAYS Performed at [facility], [address] [place], [place] [address]    Report Status 11/01/2019 FINAL  Final  SARS CORONAVIRUS 2 (TAT 6-24 HRS) Nasopharyngeal Nasopharyngeal Swab     Status: None   Collection Time: 10/27/19  3:54 PM   Specimen: Nasopharyngeal Swab  Result Value Ref Range Status   SARS Coronavirus 2 NEGATIVE NEGATIVE Final    Comment: (NOTE) SARS-CoV-2 target nucleic acids are NOT DETECTED. The SARS-CoV-2 RNA is generally detectable in upper and lower respiratory specimens during the acute phase of infection. Negative results do not preclude SARS-CoV-2 infection, do not rule out co-infections with other pathogens, and should not be used as the sole basis for treatment or other patient management decisions. Negative results must be combined with clinical observations, patient history, and epidemiological information. The expected result is Negative. Fact Sheet for Patients: SugarRoll.be Fact Sheet for Healthcare Providers: https://www.woods-mathews.com/ This test is not yet approved or cleared by the [place] FDA and  has been authorized for detection and/or diagnosis of SARS-CoV-2 by FDA under an Emergency Use Authorization (EUA). This EUA will remain  in effect (meaning this test can be used) for the duration of the COVID-19 declaration under Section 56 4(b)(1) of the Act, 21 U.S.C. section 360bbb-3(b)(1), unless the authorization is terminated or revoked sooner. Performed at [facility], [place] [address], [place], [place] [address]    MRSA PCR Screening     Status: Abnormal   Collection Time: 10/27/19  9:52 PM   Specimen: Nasopharyngeal  Result Value Ref Range Status   MRSA by PCR POSITIVE (A) NEGATIVE Final    Comment:        The GeneXpert MRSA Assay (FDA approved for NASAL specimens only), is one component of a comprehensive MRSA colonization surveillance program. It is not intended to diagnose MRSA infection  nor to guide or monitor treatment for MRSA infections. RESULT CALLED TO, READ BACK BY AND VERIFIED WITH: Shary Key RN [phone] 10/27/19 [place] Performed at [facility], [address] [place], [place] [address]     Coagulation Studies: No results for input(s): LABPROT, INR in the last 72 hours.  Urinalysis: No results for input(s): COLORURINE, LABSPEC, PHURINE, GLUCOSEU, HGBUR, BILIRUBINUR, KETONESUR, PROTEINUR, UROBILINOGEN, NITRITE, LEUKOCYTESUR in the last 72 hours.  Invalid input(s): APPERANCEUR    Imaging: PERIPHERAL VASCULAR CATHETERIZATION  Result Date: 11/02/2019 See op note    Medications:   . [MAR Hold] sodium chloride Stopped (11/01/19 0138)  . sodium chloride    . argatroban Stopped (11/02/19 1312)  . [MAR Hold] ceFEPime (MAXIPIME) IV 1 g (11/02/19 0523)  . [MAR Hold] citrate dextrose    . [MAR Hold] doxycycline (VIBRAMYCIN) IV Stopped (11/02/19 1243)  . [MAR Hold] vancomycin     . [MAR Hold] carvedilol  25 mg Oral BID WC  . [MAR Hold] chlorhexidine gluconate (MEDLINE KIT)  15 mL Mouth Rinse BID  . [MAR Hold] Chlorhexidine Gluconate Cloth  6 each Topical Daily  . [MAR Hold] epoetin (EPOGEN/PROCRIT) injection  10,000 Units Intravenous Q T,Th,Sa-HD  . [START ON 11/03/2019] feeding supplement (NEPRO CARB STEADY)  237 mL Oral BID BM  . fentaNYL      . [MAR Hold] hydrocortisone sod succinate (SOLU-CORTEF) inj  50 mg Intravenous Q6H  . [MAR Hold] insulin aspart  0-9 Units Subcutaneous Q6H  . [MAR Hold] mouth rinse  15 mL Mouth Rinse QID  . midazolam      .  multivitamin  1 tablet Oral QHS  . mupirocin ointment  1 application Nasal BID  . [MAR Hold] pantoprazole sodium  40 mg Per Tube QHS  . [MAR Hold] sodium chloride flush  10-40 mL Intracatheter Q12H   [MAR Hold] sodium chloride, [MAR Hold] acetaminophen **OR** [MAR Hold] acetaminophen, [MAR Hold] bisacodyl, [MAR Hold] citrate dextrose, [MAR Hold] fentaNYL (SUBLIMAZE) injection, [MAR Hold] hydrALAZINE, [MAR Hold] LORazepam, [MAR Hold] magnesium citrate, [MAR Hold] ondansetron **OR** [MAR Hold] ondansetron (ZOFRAN) IV, [MAR Hold] polyethylene glycol, [MAR Hold] sodium chloride flush, [MAR Hold] traMADol  Assessment/ Plan:  Brett Small is a 44 y.o. white male with end stage renal disease on hemodialysis, 44 y.o. male with end-stage renal disease on hemodialysis, hypertension, peripheral vascular disease, depression, tobacco abuse, admitted to [facility] ICU on 10/27/2019 for Cardiac arrest [facility]) [I46.9] Hyperkalemia [E87.5] Difficult intubation [T88.4XXA] Sepsis due to pneumonia ([place]) [J18.9, A41.9] Sepsis, due to unspecified organism, unspecified whether acute organ dysfunction present ([place]) [A41.9]   Placed on CRRT from 1/5 -1/9. Now off vasopressors. Transitioned to intermittent hemodialysis on 1/9  [place] TTS RIJ catheter  1.  ESRD on HD TTS.   - Complication of dialysis device. Clotted arterial line of permcath. Status post TPA on 1/9. Catheter to be replaced today.   2.  Anemia of chronic kidney disease.  Hemoglobin 8.5 With thrombocytopenia on argatroban  - EPO with HD treatment  3. Hypertension: hemodynamically stable off vasopressors. Home regimen of amlodipine, carvedilol, and hydralazine.   4.  Secondary hyperparathyroidism with hypophosphatemia  5. Right upper extremity: with thrombocytopenia - improving.  HIT negative - argatroban.    LOS: 6 Brett Small 1/11/20214:04 PM

## 2019-11-02 NOTE — H&P (Signed)
[facility] History & Physical Update  The patient was interviewed and re-examined.  The patient's previous History and Physical has been reviewed and is unchanged.  There is no change in the plan of care. We plan to proceed with the scheduled procedure.  Leotis Pain, MD  11/02/2019, 11:14 AM

## 2019-11-02 NOTE — Care Management Important Message (Signed)
Important Message  Patient Details  Name: Brett Small MRN: [id number] Date of Birth: Feb 20, 1976   Medicare Important Message Given:  Yes     Juliann Pulse A Dyon Rotert 11/02/2019, 12:07 PM

## 2019-11-02 NOTE — Op Note (Signed)
OPERATIVE NOTE    PRE-OPERATIVE DIAGNOSIS: 1. ESRD 2. Non-functional permcath  POST-OPERATIVE DIAGNOSIS: same as above  PROCEDURE: 1. Fluoroscopic guidance for placement of catheter 2. Placement of a 19 cm tip to cuff tunneled hemodialysis catheter via the right internal jugular vein and removal of previous catheter  SURGEON: Leotis Pain, MD  ANESTHESIA:  Local with moderate conscious sedation for 90 minutes using 3 mg of Versed and 100 mcg of Fentanyl (for both procedures)  ESTIMATED BLOOD LOSS: 3 cc  FINDING(S): none  SPECIMEN(S):  None  INDICATIONS:   Patient is a 44 y.o.male who presents with non-functional dialysis catheter and ESRD.  The patient needs long term dialysis access for their ESRD, and a Permcath is necessary.  Risks and benefits are discussed and informed consent is obtained.    DESCRIPTION: After obtaining full informed written consent, the patient was brought back to the vascular suite. The patient received moderate conscious sedation during a face-to-face encounter with me present throughout the entire procedure and supervising the RN monitoring the vital signs, pulse oximetry, telemetry, and mental status throughout the entire procedure. The patient's existing catheter, righ5 neck and chest were sterilely prepped and draped in a sterile surgical field was created.  The existing catheter was dissected free from the fibrous sheath securing the cuff with hemostats and blunt dissection.  A wire was placed. The existing catheter was then removed and the wire used to keep venous access. I selected a 19 cm tip to cuff tunneled dialysis catheter.  Using fluoroscopic guidance the catheter tips were parked in the right atrium. The appropriate distal connectors were placed. It withdrew blood well and flushed easily with heparinized saline and a concentrated heparin solution was then placed. It was secured to the chest wall with 2 Prolene sutures. A 4-0 Monocryl pursestring suture  was placed around the exit site. Sterile dressings were placed. The patient tolerated the procedure well and the arm angio was performed and will be dictated separately.  COMPLICATIONS: None  CONDITION: Stable  Leotis Pain 11/02/2019 2:50 PM   This note was created with Dragon Medical transcription system. Any errors in dictation are purely unintentional.

## 2019-11-03 ENCOUNTER — Encounter: Payer: Self-pay | Admitting: Cardiology

## 2019-11-03 DIAGNOSIS — J690 Pneumonitis due to inhalation of food and vomit: Secondary | ICD-10-CM

## 2019-11-03 LAB — CBC
HCT: 24.4 % — ABNORMAL LOW (ref 39.0–52.0)
Hemoglobin: 7.3 g/dL — ABNORMAL LOW (ref 13.0–17.0)
MCH: 29.1 pg (ref 26.0–34.0)
MCHC: 29.9 g/dL — ABNORMAL LOW (ref 30.0–36.0)
MCV: 97.2 fL (ref 80.0–100.0)
Platelets: 108 10*3/uL — ABNORMAL LOW (ref 150–400)
RBC: 2.51 MIL/uL — ABNORMAL LOW (ref 4.22–5.81)
RDW: 21.2 % — ABNORMAL HIGH (ref 11.5–15.5)
WBC: 11.9 10*3/uL — ABNORMAL HIGH (ref 4.0–10.5)
nRBC: 0 % (ref 0.0–0.2)

## 2019-11-03 LAB — BASIC METABOLIC PANEL
Anion gap: 9 (ref 5–15)
BUN: 47 mg/dL — ABNORMAL HIGH (ref 6–20)
CO2: 23 mmol/L (ref 22–32)
Calcium: 7.6 mg/dL — ABNORMAL LOW (ref 8.9–10.3)
Chloride: 107 mmol/L (ref 98–111)
Creatinine, Ser: 3.17 mg/dL — ABNORMAL HIGH (ref 0.61–1.24)
GFR calc Af Amer: 26 mL/min — ABNORMAL LOW (ref >60)
GFR calc non Af Amer: 22 mL/min — ABNORMAL LOW (ref >60)
Glucose, Bld: 114 mg/dL — ABNORMAL HIGH (ref 70–99)
Potassium: 4.4 mmol/L (ref 3.5–5.1)
Sodium: 139 mmol/L (ref 135–145)

## 2019-11-03 LAB — GLUCOSE, CAPILLARY
Glucose-Capillary: 103 mg/dL — ABNORMAL HIGH (ref 70–99)
Glucose-Capillary: 139 mg/dL — ABNORMAL HIGH (ref 70–99)
Glucose-Capillary: 142 mg/dL — ABNORMAL HIGH (ref 70–99)

## 2019-11-03 LAB — SEROTONIN RELEASE ASSAY (SRA)
SRA .2 IU/mL UFH Ser-aCnc: <1 % (ref 0–20)
SRA 100IU/mL UFH Ser-aCnc: <1 % (ref 0–20)

## 2019-11-03 LAB — HEPATITIS B SURFACE ANTIGEN: Hepatitis B Surface Ag: NONREACTIVE

## 2019-11-03 LAB — APTT: aPTT: 59 seconds — ABNORMAL HIGH (ref 24–36)

## 2019-11-03 MED ORDER — APIXABAN 5 MG PO TABS: 5.0000 mg | ORAL_TABLET | Freq: Two times a day (BID) | ORAL | Status: DC

## 2019-11-03 MED ORDER — APIXABAN 5 MG PO TABS
10.0000 mg | ORAL_TABLET | Freq: Two times a day (BID) | ORAL | Status: DC
  Administered 2019-11-03 – 2019-11-04 (×3): 10 mg via ORAL
  Filled 2019-11-03 (×3): qty 2

## 2019-11-03 MED ORDER — SEVELAMER CARBONATE 800 MG PO TABS
1600.0000 mg | ORAL_TABLET | Freq: Three times a day (TID) | ORAL | Status: DC
  Administered 2019-11-03 – 2019-11-04 (×2): 1600 mg via ORAL
  Filled 2019-11-03 (×5): qty 2

## 2019-11-03 NOTE — Progress Notes (Signed)
Need labs drawn in dialysis today, lab unable to draw labs this morning. Patient refuses HCG bath. Education provided. States "It don't do any good"

## 2019-11-03 NOTE — Progress Notes (Signed)
HD Tx started    11/03/19 0800  Vital Signs  Pulse Rate 100  Pulse Rate Source Monitor  Resp 12  BP (!) 138/91  BP Location Right Leg  BP Method Automatic  Patient Position (if appropriate) Lying  Oxygen Therapy  SpO2 97 %  Pulse Oximetry Type Continuous  During Hemodialysis Assessment  Blood Flow Rate (mL/min) 400 mL/min  Arterial Pressure (mmHg) -160 mmHg  Venous Pressure (mmHg) 110 mmHg  Transmembrane Pressure (mmHg) 60 mmHg  Ultrafiltration Rate (mL/min) 710 mL/min  Dialysate Flow Rate (mL/min) 600 ml/min  Conductivity: Machine  14.1  HD Safety Checks Performed Yes  Dialysis Fluid Bolus Normal Saline  Bolus Amount (mL) 250 mL  Intra-Hemodialysis Comments Tx initiated

## 2019-11-03 NOTE — Progress Notes (Signed)
PROGRESS NOTE                                                                                                                                                                                                             Brett Small Demographics:    Brett Small, is a 44 y.o. male, DOB - 1976-08-31, ZOX:[id number]  Admit date - 10/27/2019   Admitting Physician Ezekiel Slocumb, DO  Outpatient Primary MD for the Brett Small is Brett Small, [name]  LOS - 7  Outpatient Specialists: renal  Chief Complaint  Brett Small presents with  . Emesis       Brief Narrative  44 year old male with ESRD on dialysis, chronic systolic CHF with EF <81% presented to the ED on 10/26/2018 with chest pain, shortness of breath and vomiting.  He was found to have sepsis secondary to pneumonia, metabolic acidosis and NSTEMI.  He had PEA in the ED and CODE BLUE was called.  Brett Small received CPR and given 2 rounds of epinephrine and calcium gluconate after which ROSC was obtained.  Brett Small intubated and transferred to ED.  Successfully extubated on 1/8.   Brett Small also developed DVT of the right radial vein. Transferred to hospital service on 1/9.   Subjective:   Brett Small seen after returning from dialysis. Right wrist pain better.   Assessment  & Plan :    Principal Problem: Sepsis due to pneumonia ([place]) Completed 7 days of antibiotic. Cultures negative.  Active problems Acute respiratory failure with hypoxia and hypercapnia. Intubated and admitted to ICU.  Extubated on 1/8 and currently maintaining sats on 2 L via nasal cannula.   NSTEMI (non-ST elevated myocardial infarction) ([place]) Has severe cardiomyopathy.  On argatroban drip.  Heart rate improved after Coreg resumed. Cardiology recommended medical management.  Right radial vein DVT with thrombocytopenia/clotted arterial line permacath On argatroban.  HIT antibody negative. Switch to full dose Eliquis.  (Brett Small was on 2.5 mg Eliquis twice daily at home). For right upper extremity arteriogram today.     End stage renal disease on dialysis (TTS) (HCC) Clotted permacath, TPA given on 1/9.  He has left AV fistula (not matured for dialysis yet).  Tunneled HD catheter placed by vascular today.      Chronic systolic CHF (congestive heart failure) (HCC) Euvolemic.  Volume management with dialysis.  Generalized weakness PT evaluation. May  need SNF given significant weakness. Nutrition consult.  Anemia of chronic kidney disease Associated thrombocytopenia.  Platelets currently stable.  Improved with HD.  Essential hypertension Required pressors for sepsis with hypotension.  Amlodipine and hydralazine on hold.  Coreg resumed due to tachycardia.  Generalized weakness PT evaluation.  Code Status : Full code  Family Communication  : None  Disposition Plan  : Possibly needs SNF given significant weakness. PT eval pending. Should be stable for discharge in the next 24-48 h.  Barriers For Discharge : Improving symptoms  Consults  : Critical care, cardiology, renal  Procedures  : Intubation, Doppler right upper extremity  DVT Prophylaxis  : Eliquis Lab Results  Component Value Date   PLT 108 (L) 11/03/2019    Antibiotics  :    Anti-infectives (From admission, onward)   Start     Dose/Rate Route Frequency Ordered Stop   11/03/19 1200  vancomycin (VANCOREADY) IVPB 500 mg/100 mL  Status:  Discontinued     500 mg 100 mL/hr over 60 Minutes Intravenous Every T-Th-Sa (Hemodialysis) 10/31/19 1430 10/31/19 1434   11/03/19 1200  vancomycin (VANCOREADY) IVPB 500 mg/100 mL     500 mg 100 mL/hr over 60 Minutes Intravenous Every T-Th-Sa (Hemodialysis) 10/31/19 1434 11/03/19 1330   11/03/19 0600  clindamycin (CLEOCIN) IVPB 300 mg  Status:  Discontinued    Note to Pharmacy: To be given in specials   300 mg 100 mL/hr over 30 Minutes Intravenous  Once 11/02/19 1706 11/02/19 1816   10/31/19 2000   ceFEPIme (MAXIPIME) 1 g in sodium chloride 0.9 % 100 mL IVPB     1 g 200 mL/hr over 30 Minutes Intravenous Every 24 hours 10/31/19 0847 11/04/19 1959   10/31/19 1445  vancomycin (VANCOREADY) IVPB 750 mg/150 mL     750 mg 150 mL/hr over 60 Minutes Intravenous  Once 10/31/19 1430 10/31/19 2306   10/31/19 0847  vancomycin variable dose per unstable renal function (pharmacist dosing)  Status:  Discontinued      Does not apply See admin instructions 10/31/19 0847 11/02/19 0835   10/30/19 2200  doxycycline (VIBRAMYCIN) 100 mg in sodium chloride 0.9 % 250 mL IVPB     100 mg 125 mL/hr over 120 Minutes Intravenous Every 12 hours 10/30/19 1241 11/04/19 2159   10/29/19 1200  vancomycin (VANCOREADY) IVPB 500 mg/100 mL  Status:  Discontinued     500 mg 100 mL/hr over 60 Minutes Intravenous Every T-Th-Sa (Hemodialysis) 10/27/19 1531 10/28/19 0846   10/28/19 2200  doxycycline (VIBRAMYCIN) 200 mg in dextrose 5 % 250 mL IVPB  Status:  Discontinued     200 mg 125 mL/hr over 120 Minutes Intravenous Every 12 hours 10/28/19 1057 10/30/19 1241   10/28/19 1800  ceFEPIme (MAXIPIME) 1 g in sodium chloride 0.9 % 100 mL IVPB  Status:  Discontinued     1 g 200 mL/hr over 30 Minutes Intravenous Every 24 hours 10/27/19 1531 10/28/19 0846   10/28/19 1200  vancomycin (VANCOREADY) IVPB 500 mg/100 mL  Status:  Discontinued     500 mg 100 mL/hr over 60 Minutes Intravenous Every 24 hours 10/28/19 0846 10/31/19 0847   10/28/19 1000  ceFEPIme (MAXIPIME) 2 g in sodium chloride 0.9 % 100 mL IVPB  Status:  Discontinued     2 g 200 mL/hr over 30 Minutes Intravenous Every 12 hours 10/28/19 0846 10/31/19 0847   10/27/19 1600  azithromycin (ZITHROMAX) 500 mg in sodium chloride 0.9 % 250 mL IVPB  Status:  Discontinued  500 mg 250 mL/hr over 60 Minutes Intravenous Every 24 hours 10/27/19 1518 10/28/19 1053   10/27/19 1530  ceFEPIme (MAXIPIME) 2 g in sodium chloride 0.9 % 100 mL IVPB  Status:  Discontinued     2 g 200 mL/hr over  30 Minutes Intravenous  Once 10/27/19 1518 10/27/19 1525   10/27/19 1515  vancomycin (VANCOCIN) IVPB 1000 mg/200 mL premix  Status:  Discontinued    Note to Pharmacy: Please adjust start times accordingly as Brett Small got broad spectrum antibiotics in ED.   1,000 mg 200 mL/hr over 60 Minutes Intravenous  Once 10/27/19 1518 10/27/19 1524   10/27/19 1245  ceFEPIme (MAXIPIME) 2 g in sodium chloride 0.9 % 100 mL IVPB     2 g 200 mL/hr over 30 Minutes Intravenous  Once 10/27/19 1240 10/27/19 1415   10/27/19 1245  metroNIDAZOLE (FLAGYL) IVPB 500 mg     500 mg 100 mL/hr over 60 Minutes Intravenous  Once 10/27/19 1240 10/27/19 1620   10/27/19 1245  vancomycin (VANCOCIN) IVPB 1000 mg/200 mL premix     1,000 mg 200 mL/hr over 60 Minutes Intravenous  Once 10/27/19 1240 10/27/19 1620        Objective:   Vitals:   11/03/19 1100 11/03/19 1103 11/03/19 1125 11/03/19 1510  BP: 137/80 (!) 142/76 (!) 143/79 (!) 127/92  Pulse: (!) 101 (!) 101 98 98  Resp: '11  18 17  '$ Temp:   98.1 F (36.7 C) 98.3 F (36.8 C)  TempSrc:   Oral Oral  SpO2:   97% 98%  Weight:      Height:        Wt Readings from Last 3 Encounters:  11/03/19 48.1 kg  10/07/19 46.8 kg  09/21/19 44.8 kg     Intake/Output Summary (Last 24 hours) at 11/03/2019 1533 Last data filed at 11/03/2019 1300 Gross per 24 hour  Intake 988.81 ml  Output --  Net 988.81 ml   Physical exam Not in distress HEENT: Moist mucosa, supple neck Chest: Clear bilaterally, right-sided tunneled HD catheter CVS: Normal L2-G4, 3/6 systolic murmur GI: Soft, nondistended, nontender Musculoskeletal: Right wrist swelling (improving)     Data Review:    CBC Recent Labs  Lab 10/28/19 0756 10/31/19 0050 10/31/19 0532 11/01/19 0407 11/02/19 0726 11/03/19 0840  WBC 8.7 12.8* 13.0* 18.1* 12.1* 11.9*  HGB 7.3* 6.5* 8.7* 9.2* 8.5* 7.3*  HCT 22.4* 22.7* 28.5* 30.0* 28.8* 24.4*  PLT 50* 58* 62* 100* 89* 108*  MCV 91.8 99.1 94.1 94.9 97.0 97.2  MCH  29.9 28.4 28.7 29.1 28.6 29.1  MCHC 32.6 28.6* 30.5 30.7 29.5* 29.9*  RDW 21.3* 21.4* 21.1* 21.6* 21.2* 21.2*  LYMPHSABS 0.5*  --   --   --   --   --   MONOABS 0.4  --   --   --   --   --   EOSABS 0.0  --   --   --   --   --   BASOSABS 0.0  --   --   --   --   --     Chemistries  Recent Labs  Lab 10/27/19 1823 10/28/19 0445 10/29/19 0206 10/30/19 0430 10/30/19 1921 10/31/19 0532 10/31/19 1609 11/01/19 0407 11/03/19 0840  NA 131* 132*  --  136 134* 135 136 138 139  K 4.8 4.7  --  4.6 4.4 4.6 4.5 4.0 4.4  CL 99 87*  --  103 104 103 102 102 107  CO2 11* 30  --  24 21* _0 GLUCOSE 104* 125*  --  135* 144* 128* 154* 110* 114*  BUN 60* 72*  --  22* 21* 18 25* 14 47*  CREATININE 5.18* 5.80*  --  1.29* 1.20 1.11 1.83* 1.34* 3.17*  CALCIUM >15.0* 8.2*  --  7.6* 7.8* 8.2* 8.6* 8.6* 7.6*  MG  --  1.8 2.4 2.2  --  2.3  --  2.0  --   AST 29 37  --   --   --   --   --   --   --   ALT 14 19  --   --   --   --   --   --   --   ALKPHOS 63 58  --   --   --   --   --   --   --   BILITOT 1.9* 2.5*  --   --   --   --   --   --   --    ------------------------------------------------------------------------------------------------------------------ No results for input(s): CHOL, HDL, LDLCALC, TRIG, CHOLHDL, LDLDIRECT in the last 72 hours.  Lab Results  Component Value Date   HGBA1C 4.7 (L) 07/09/2019   ------------------------------------------------------------------------------------------------------------------ No results for input(s): TSH, T4TOTAL, T3FREE, THYROIDAB in the last 72 hours.  Invalid input(s): FREET3 ------------------------------------------------------------------------------------------------------------------ No results for input(s): VITAMINB12, FOLATE, FERRITIN, TIBC, IRON, RETICCTPCT in the last 72 hours.  Coagulation profile Recent Labs  Lab 10/28/19 0445 10/28/19 0842  INR 2.4* 1.9*    No results for input(s): DDIMER in the last 72  hours.  Cardiac Enzymes No results for input(s): CKMB, TROPONINI, MYOGLOBIN in the last 168 hours.  Invalid input(s): CK ------------------------------------------------------------------------------------------------------------------    Component Value Date/Time   BNP 3,772.0 (H) 08/05/2019 0431    Inpatient Medications  Scheduled Meds: . apixaban  10 mg Oral BID   Followed by  . [START ON 11/10/2019] apixaban  5 mg Oral BID  . carvedilol  25 mg Oral BID WC  . chlorhexidine gluconate (MEDLINE KIT)  15 mL Mouth Rinse BID  . Chlorhexidine Gluconate Cloth  6 each Topical Daily  . epoetin (EPOGEN/PROCRIT) injection  10,000 Units Intravenous Q T,Th,Sa-HD  . feeding supplement (NEPRO CARB STEADY)  237 mL Oral BID BM  . hydrocortisone sod succinate (SOLU-CORTEF) inj  50 mg Intravenous Q6H  . insulin aspart  0-9 Units Subcutaneous Q6H  . mouth rinse  15 mL Mouth Rinse QID  . multivitamin  1 tablet Oral QHS  . pantoprazole sodium  40 mg Per Tube QHS  . sevelamer carbonate  1,600 mg Oral TID WC  . sodium chloride flush  10-40 mL Intracatheter Q12H   Continuous Infusions: . sodium chloride Stopped (11/01/19 0138)  . ceFEPime (MAXIPIME) IV Stopped (11/02/19 2106)  . citrate dextrose    . doxycycline (VIBRAMYCIN) IV 100 mg (11/03/19 1344)   PRN Meds:.sodium chloride, acetaminophen **OR** acetaminophen, bisacodyl, citrate dextrose, fentaNYL (SUBLIMAZE) injection, hydrALAZINE, HYDROmorphone (DILAUDID) injection, LORazepam, magnesium citrate, ondansetron **OR** ondansetron (ZOFRAN) IV, ondansetron (ZOFRAN) IV, polyethylene glycol, sodium chloride flush, traMADol  Micro Results Recent Results (from the past 240 hour(s))  Blood Culture (routine x 2)     Status: Abnormal   Collection Time: 10/27/19  1:07 PM   Specimen: BLOOD  Result Value Ref Range Status   Specimen Description   Final    BLOOD BLOOD RIGHT HAND Performed at [facility], [address], [place], [place]  [address]    Special Requests  Final    BOTTLES DRAWN AEROBIC AND ANAEROBIC Blood Culture results may not be optimal due to an inadequate volume of blood received in culture bottles Performed at [facility], [address] [place], [place] [address]    Culture  Setup Time   Final    GRAM POSITIVE RODS AEROBIC BOTTLE ONLY CRITICAL RESULT CALLED TO, READ BACK BY AND VERIFIED WITH: SCOTT HALL 10/28/19 @ 61  [place] Performed at [facility], [address] [place], [place] [address]    Culture CLOSTRIDIUM SPECIES (A)  Final   Report Status 10/31/2019 FINAL  Final  Blood Culture (routine x 2)     Status: None   Collection Time: 10/27/19  1:07 PM   Specimen: BLOOD  Result Value Ref Range Status   Specimen Description BLOOD BLOOD LEFT HAND  Final   Special Requests   Final    BOTTLES DRAWN AEROBIC ONLY Blood Culture results may not be optimal due to an inadequate volume of blood received in culture bottles   Culture   Final    NO GROWTH 5 DAYS Performed at [facility], [address], [place], [place] [address]    Report Status 11/01/2019 FINAL  Final  SARS CORONAVIRUS 2 (TAT 6-24 HRS) Nasopharyngeal Nasopharyngeal Swab     Status: None   Collection Time: 10/27/19  3:54 PM   Specimen: Nasopharyngeal Swab  Result Value Ref Range Status   SARS Coronavirus 2 NEGATIVE NEGATIVE Final    Comment: (NOTE) SARS-CoV-2 target nucleic acids are NOT DETECTED. The SARS-CoV-2 RNA is generally detectable in upper and lower respiratory specimens during the acute phase of infection. Negative results do not preclude SARS-CoV-2 infection, do not rule out co-infections with other pathogens, and should not be used as the sole basis for treatment or other Brett Small management decisions. Negative results must be combined with clinical observations, Brett Small history, and epidemiological information. The expected result is Negative. Fact Sheet for  Patients: SugarRoll.be Fact Sheet for Healthcare Providers: https://www.woods-mathews.com/ This test is not yet approved or cleared by the [place] FDA and  has been authorized for detection and/or diagnosis of SARS-CoV-2 by FDA under an Emergency Use Authorization (EUA). This EUA will remain  in effect (meaning this test can be used) for the duration of the COVID-19 declaration under Section 56 4(b)(1) of the Act, 21 U.S.C. section 360bbb-3(b)(1), unless the authorization is terminated or revoked sooner. Performed at [facility], [place] [address] [place], [place] [address]   MRSA PCR Screening     Status: Abnormal   Collection Time: 10/27/19  9:52 PM   Specimen: Nasopharyngeal  Result Value Ref Range Status   MRSA by PCR POSITIVE (A) NEGATIVE Final    Comment:        The GeneXpert MRSA Assay (FDA approved for NASAL specimens only), is one component of a comprehensive MRSA colonization surveillance program. It is not intended to diagnose MRSA infection nor to guide or monitor treatment for MRSA infections. RESULT CALLED TO, READ BACK BY AND VERIFIED WITH: Shary Key RN [id number] 10/27/19 HNM Performed at [facility], [address], [place], [place] [address]     Radiology Reports EEG  Result Date: 10/28/2019 Lora Havens, MD     10/28/2019  3:18 PM Brett Small Name: Jamaine Quintin MRN: [id number] Epilepsy Attending: Lora Havens Referring Physician/Provider: Darel Hong, NP Date: 10/27/2018 Duration: 27.57 minutes Brett Small history: 44 year old male with history of seizure disorder now status post cardiac arrest.  EEG to evaluate for seizures Level of alertness: Comatose/sedated AEDs during EEG study: None Technical aspects: This EEG study was done with scalp electrodes positioned according to the 10-20 International system of electrode placement. Electrical activity was acquired at a sampling rate of 500Hz and  reviewed with a high frequency filter of 70Hz and a low frequency filter of 1Hz. EEG data were recorded continuously and digitally stored. Description: EEG showed continuous generalized, maximal bitemporal 3 to 6 Hz theta-delta slowing.  EEG was reactive to noxious stimuli.  Hyperventilation photic summation were not performed. Abnormality -Continuous slow, generalized, maximal bitemporal IMPRESSION: This study is suggestive of nonspecific bitemporal cortical dysfunction as well as severe diffuse encephalopathy, nonspecific to etiology but could be secondary to sedation. No seizures or epileptiform discharges were seen throughout the recording. Lora Havens   DG Chest 1 View  Result Date: 10/27/2019 CLINICAL DATA:  Difficult intubation status post CPR EXAM: CHEST  1 VIEW COMPARISON:  October 27, 2019 at 12:19 p.m. FINDINGS: The heart size remains enlarged. There is a small to moderate-sized left-sided pleural effusion. There is a small right-sided pleural effusion. There is diffuse increased attenuation within the left lung field. Prominent interstitial lung markings are noted. There is a stable tunneled dialysis catheter on the right. The endotracheal tube terminates approximately 3 cm above the carina. There is no pneumothorax. IMPRESSION: 1. Lines and tubes as above. 2. Persistent bilateral pleural effusions, left greater than right. 3. Worsening left-sided airspace disease concerning for pneumonia. Electronically Signed   By: Constance Holster M.D.   On: 10/27/2019 20:07   DG Chest 2 View  Result Date: 10/27/2019 CLINICAL DATA:  44 year old male with history of chest pain. Vomiting for the past 2 weeks. Diarrhea since yesterday. EXAM: CHEST - 2 VIEW COMPARISON:  Chest x-ray 10/03/2019. FINDINGS: Right internal jugular PermCath with tips terminating in the right atrium and superior cavoatrial junction. Left upper extremity vascular stent extending into the axillary region. Lung volumes are slightly low.  Opacity at the left base may reflect atelectasis and/or consolidation. Small to moderate left pleural effusion, slightly decreased compared to the prior study. Right lung is clear. No right pleural effusion. No pneumothorax. No definite suspicious appearing pulmonary nodules or masses. No evidence of pulmonary edema. Mild cardiomegaly. Upper mediastinal contours are within normal limits. IMPRESSION: 1. Support apparatus and postoperative changes, as above. 2. Left lower lobe atelectasis and/or consolidation with superimposed small to moderate left pleural effusion, slightly decreased compared to the prior study. Electronically Signed   By: Vinnie Langton M.D.   On: 10/27/2019 12:36   DG Ankle Complete Right  Result Date: 10/27/2019 CLINICAL DATA:  Chronic wound with cellulitis EXAM: RIGHT ANKLE - COMPLETE 3+ VIEW COMPARISON:  None. FINDINGS: Frontal, oblique, and lateral views obtained. There is evidence of old trauma involving the calcaneus with remodeling. No acute fracture or joint effusion is evident. There is narrowing of the posterior aspect of the ankle joint. There is arthropathy throughout the subtalar joint regions. No frank erosion or bony destruction evident. IMPRESSION: Evidence of old trauma involving the calcaneus. Arthropathy throughout the subtalar joints. There is narrowing of the posterior ankle joint. No frank bony destruction appreciated by radiography. No acute fracture or joint effusion evident. If there remains concern for osteomyelitis, MR or nuclear medicine three-phase bone scan could be helpful for further assessment. Electronically Signed   By: Lowella Grip III M.D.   On: 10/27/2019 13:09   DG Abd 1 View  Result Date: 10/29/2019  CLINICAL DATA:  Orogastric tube placement EXAM: ABDOMEN - 1 VIEW COMPARISON:  Portable exam 1328 hours compared to 10/28/2019 FINDINGS: Nasogastric tube coiled in proximal stomach. Tips of dual lumen RIGHT jugular central venous catheter projects  over cavoatrial junction and upper RIGHT atrium. Tip of endotracheal tube projects approximately 3.9 cm above carina. Enlargement of cardiac silhouette with atelectasis versus consolidation of LEFT lower lobe and associated small LEFT pleural effusion. Vascular stents identified in upper abdomen. IMPRESSION: Nasogastric tube coiled in proximal stomach. Persistent atelectasis versus consolidation LEFT lower lobe with associated small LEFT pleural effusion. Electronically Signed   By: Lavonia Dana M.D.   On: 10/29/2019 13:45   DG Abd 1 View  Result Date: 10/28/2019 CLINICAL DATA:  OG tube placement. EXAM: ABDOMEN - 1 VIEW COMPARISON:  Single-view of the abdomen 10/04/2019. FINDINGS: OG tube is in place with the tip in the distal stomach. IMPRESSION: As above. Electronically Signed   By: Inge Rise M.D.   On: 10/28/2019 11:56   DG Abd 1 View  Result Date: 10/04/2019 CLINICAL DATA:  NG tube placement EXAM: ABDOMEN - 1 VIEW COMPARISON:  10/04/2019 FINDINGS: Interval placement of esophagogastric tube, tip near the gastroesophageal junction and side port above the diaphragm. Recommend advancement to ensure subdiaphragmatic positioning of tip and side port. Nonobstructive pattern of bowel gas in the included abdomen. No obvious free air. Stent projects over the central abdomen. Cardiomegaly. IMPRESSION: Interval placement of esophagogastric tube, tip near the gastroesophageal junction and side port above the diaphragm. Recommend advancement to ensure subdiaphragmatic positioning of tip and side port. Electronically Signed   By: Eddie Candle M.D.   On: 10/04/2019 17:10   CT HEAD WO CONTRAST  Result Date: 10/28/2019 CLINICAL DATA:  Cardiac arrest. EXAM: CT HEAD WITHOUT CONTRAST TECHNIQUE: Contiguous axial images were obtained from the base of the skull through the vertex without intravenous contrast. COMPARISON:  None. FINDINGS: Brain: There is no mass, hemorrhage or extra-axial collection. The size and  configuration of the ventricles and extra-axial CSF spaces are normal. The brain parenchyma is normal, without acute or chronic infarction. Vascular: No abnormal hyperdensity of the major intracranial arteries or dural venous sinuses. No intracranial atherosclerosis. Skull: The visualized skull base, calvarium and extracranial soft tissues are normal. Sinuses/Orbits: No fluid levels or advanced mucosal thickening of the visualized paranasal sinuses. No mastoid or middle ear effusion. The orbits are normal. IMPRESSION: Normal head CT. Electronically Signed   By: Ulyses Jarred M.D.   On: 10/28/2019 01:50   PERIPHERAL VASCULAR CATHETERIZATION  Result Date: 11/02/2019 See op note  US Venous Img Upper Uni Right(DVT)  Result Date: 10/28/2019 CLINICAL DATA:  Dialysis Brett Small, right arm graft removed, skin changes and swelling. EXAM: RIGHT UPPER EXTREMITY VENOUS DOPPLER ULTRASOUND TECHNIQUE: Gray-scale sonography with graded compression, as well as color Doppler and duplex ultrasound were performed to evaluate the upper extremity deep venous system from the level of the subclavian vein and including the jugular, axillary, basilic, radial, ulnar and upper cephalic vein. Spectral Doppler was utilized to evaluate flow at rest and with distal augmentation maneuvers. COMPARISON:  None. FINDINGS: Contralateral Subclavian Vein: Respiratory phasicity is normal and symmetric with the symptomatic side. No evidence of thrombus. Normal compressibility. Internal Jugular Vein: No evidence of thrombus. Normal compressibility, respiratory phasicity and response to augmentation. Subclavian Vein: No evidence of thrombus. Normal compressibility, respiratory phasicity and response to augmentation. Axillary Vein: No evidence of thrombus. Normal compressibility, respiratory phasicity and response to augmentation. Cephalic Vein: No evidence of  thrombus. Normal compressibility, respiratory phasicity and response to augmentation. Basilic  Vein: No evidence of thrombus. Normal compressibility, respiratory phasicity and response to augmentation. Brachial Veins: No evidence of thrombus. Normal compressibility, respiratory phasicity and response to augmentation. Radial Veins: The forearm radial vein demonstrates hypoechoic thrombus appearing occlusive. Vein noncompressible. Minor thrombus burden without propagation into the brachial vein of the upper arm. Ulnar Veins: No evidence of thrombus. Normal compressibility, respiratory phasicity and response to augmentation. IMPRESSION: Right forearm radial vein DVT. Very low thrombus burden. No propagation into the brachial vein. Electronically Signed   By: Jerilynn Mages.  Shick M.D.   On: 10/28/2019 10:31   DG Chest Port 1 View  Result Date: 10/28/2019 CLINICAL DATA:  Acute respiratory failure EXAM: PORTABLE CHEST 1 VIEW COMPARISON:  Radiograph 10/27/2019 FINDINGS: Dual lumen right IJ approach dialysis catheter tip terminates at the level of the right atrium and superior cavoatrial junction. An endotracheal tube terminates low in the trachea, approximately 1 cm from the carina. Pacer pad overlies the left chest wall. Additional support devices project over the chest. Left axillary vascular stent is again seen. Lung volumes remain diminished with bilateral patchy airspace opacities similar on the left and slightly increasing on the right when compared to prior imaging. No pneumothorax. Suspect left pleural effusion. No visible right effusion. No acute osseous or soft tissue abnormality. IMPRESSION: 1. Endotracheal tube terminates low in the trachea, approximately 1 cm from the carina. Consider retraction 2-3 cm to position in the mid trachea. 2. Bilateral patchy airspace opacities similar on the left and slightly increasing on the right when compared to prior imaging. Worrisome for infectious consolidation versus edema. 3. Left pleural effusion. These results will be called to the ordering clinician or representative by  the Radiologist Assistant, and communication documented in the PACS or zVision Dashboard. Electronically Signed   By: Lovena Le M.D.   On: 10/28/2019 03:13   DG Abd 2 Views  Result Date: 10/04/2019 CLINICAL DATA:  Vomiting. EXAM: ABDOMEN - 2 VIEW COMPARISON:  08/25/2019 FINDINGS: There is no bowel dilation to suggest obstruction. No air-fluid levels or free air. Vascular stent overlies the upper to mid lumbar spine. A right common iliac stent is present. These are stable from prior study. There are surgical vascular clips in the central abdomen and left lower quadrant. These are also unchanged. No evidence of renal or ureteral stones. There is opacity at the left lung base, incompletely imaged. Skeletal structures unremarkable. IMPRESSION: 1. No evidence of bowel obstruction or free air.  No acute findings. Electronically Signed   By: Lajean Manes M.D.   On: 10/04/2019 16:11   ECHOCARDIOGRAM COMPLETE  Result Date: 10/29/2019   ECHOCARDIOGRAM REPORT   Brett Small Name:   DAILAN PFALZGRAF Date of Exam: 10/29/2019 Medical Rec #:  [id number]      Height:       61.0 in Accession #:    [id number]     Weight:       100.3 lb Date of Birth:  [birth year]      BSA:          1.41 m Brett Small Age:    43 years       BP:           None listed/None listed mmHg Brett Small Gender: M              HR:           74 bpm. Exam Location:  [facility] Procedure: 2D Echo, Cardiac Doppler and  Color Doppler Indications:     bacteremia 790.7  History:         Brett Small has prior history of Echocardiogram examinations, most                  recent 08/04/2019. Risk Factors:Hypertension. PAD.  Sonographer:     Sherrie Sport RDCS (AE) Referring Phys:  [id number] [name] Diagnosing Phys: Bartholome Bill MD IMPRESSIONS  1. Left ventricular ejection fraction, by visual estimation, is 25 to 30%. The left ventricle has moderate to severely decreased function. Left ventricular septal wall thickness was mildly increased. Mildly increased left ventricular posterior  wall thickness. There is mildly increased left ventricular hypertrophy.  2. Left ventricular diastolic parameters are consistent with Grade I diastolic dysfunction (impaired relaxation).  3. The left ventricle demonstrates global hypokinesis.  4. Global right ventricle has normal systolic function.The right ventricular size is mildly enlarged. No increase in right ventricular wall thickness.  5. Left atrial size was mildly dilated.  6. Right atrial size was mildly dilated.  7. Moderate pleural effusion in the left lateral region.  8. The pericardial effusion is circumferential.  9. Trivial pericardial effusion is present. 10. The mitral valve is grossly normal. Mild mitral valve regurgitation. 11. The tricuspid valve is grossly normal. 12. The aortic valve is tricuspid. Aortic valve regurgitation is trivial. 13. The pulmonic valve was not well visualized. Pulmonic valve regurgitation is trivial. 14. Moderately elevated pulmonary artery systolic pressure. FINDINGS  Left Ventricle: Left ventricular ejection fraction, by visual estimation, is 25 to 30%. The left ventricle has moderate to severely decreased function. The left ventricle demonstrates global hypokinesis. Mildly increased left ventricular posterior wall thickness. There is mildly increased left ventricular hypertrophy. Left ventricular diastolic parameters are consistent with Grade I diastolic dysfunction (impaired relaxation). Right Ventricle: The right ventricular size is mildly enlarged. No increase in right ventricular wall thickness. Global RV systolic function is has normal systolic function. The tricuspid regurgitant velocity is 2.79 m/s, and with an assumed right atrial  pressure of 10 mmHg, the estimated right ventricular systolic pressure is moderately elevated at 41.1 mmHg. Left Atrium: Left atrial size was mildly dilated. Right Atrium: Right atrial size was mildly dilated Pericardium: Trivial pericardial effusion is present. The pericardial  effusion is circumferential. There is no evidence of cardiac tamponade. There is a moderate pleural effusion in the left lateral region. Mitral Valve: The mitral valve is grossly normal. Mild mitral valve regurgitation. There is no evidence of mitral valve vegetation. Tricuspid Valve: The tricuspid valve is grossly normal. Tricuspid valve regurgitation is mild. Aortic Valve: The aortic valve is tricuspid. Aortic valve regurgitation is trivial. Aortic valve mean gradient measures 2.0 mmHg. Aortic valve peak gradient measures 4.2 mmHg. Aortic valve area, by VTI measures 1.81 cm. Pulmonic Valve: The pulmonic valve was not well visualized. Pulmonic valve regurgitation is trivial. Pulmonic regurgitation is trivial. Aorta: The aortic root is normal in size and structure. IAS/Shunts: No atrial level shunt detected by color flow Doppler.  LEFT VENTRICLE PLAX 2D LVIDd:         5.16 cm       Diastology LVIDs:         4.48 cm       LV e' lateral:   6.42 cm/s LV PW:         0.91 cm       LV E/e' lateral: 11.0 LV IVS:        0.99 cm  LV e' medial:    4.57 cm/s LVOT diam:     2.00 cm       LV E/e' medial:  15.4 LV SV:         36 ml LV SV Index:   25.63 LVOT Area:     3.14 cm  LV Volumes (MOD) LV area d, A4C:    22.10 cm LV area s, A4C:    20.70 cm LV major d, A4C:   5.96 cm LV major s, A4C:   6.08 cm LV vol d, MOD A4C: 67.5 ml LV vol s, MOD A4C: 58.7 ml LV SV MOD A4C:     67.5 ml RIGHT VENTRICLE RV Basal diam:  3.07 cm RV S prime:     8.49 cm/s TAPSE (M-mode): 2.8 cm LEFT ATRIUM           Index       RIGHT ATRIUM           Index LA diam:      3.35 cm 2.38 cm/m  RA Area:     16.40 cm LA Vol (A4C): 30.0 ml 21.30 ml/m RA Volume:   45.00 ml  31.95 ml/m  AORTIC VALVE                   PULMONIC VALVE AV Area (Vmax):    1.71 cm    PV Vmax:        0.47 m/s AV Area (Vmean):   1.89 cm    PV Peak grad:   0.9 mmHg AV Area (VTI):     1.81 cm    RVOT Peak grad: 3 mmHg AV Vmax:           103.00 cm/s AV Vmean:          63.450  cm/s AV VTI:            0.138 m AV Peak Grad:      4.2 mmHg AV Mean Grad:      2.0 mmHg LVOT Vmax:         56.20 cm/s LVOT Vmean:        38.200 cm/s LVOT VTI:          0.079 m LVOT/AV VTI ratio: 0.58  AORTA Ao Root diam: 2.60 cm MITRAL VALVE                        TRICUSPID VALVE MV Area (PHT): 3.89 cm             TR Peak grad:   31.1 mmHg MV PHT:        56.55 msec           TR Vmax:        289.00 cm/s MV Decel Time: 195 msec MV E velocity: 70.30 cm/s 103 cm/s  SHUNTS MV A velocity: 36.40 cm/s 70.3 cm/s Systemic VTI:  0.08 m MV E/A ratio:  1.93       1.5       Systemic Diam: 2.00 cm  Bartholome Bill MD Electronically signed by Bartholome Bill MD Signature Date/Time: 10/29/2019/10:46:08 AM    Final     Time Spent in minutes 25   Kemauri Musa M.D on 11/03/2019 at 3:33 PM  Between 7am to 7pm - Pager - [phone]  After 7pm go to [email or web address] - password [facility]  [facility] -  Office  [phone]

## 2019-11-03 NOTE — Progress Notes (Signed)
[facility]  ROUNDING NOTE   Subjective:   Seen and examined on hemodialysis. Tolerating treatment well.     HEMODIALYSIS FLOWSHEET:  Blood Flow Rate (mL/min): 400 mL/min Arterial Pressure (mmHg): -160 mmHg Venous Pressure (mmHg): 110 mmHg Transmembrane Pressure (mmHg): 60 mmHg Ultrafiltration Rate (mL/min): 710 mL/min Dialysate Flow Rate (mL/min): 600 ml/min Conductivity: Machine : 14 Conductivity: Machine : 14 Dialysis Fluid Bolus: Normal Saline Bolus Amount (mL): 250 mL    Objective:  Vital signs in last 24 hours:  Temp:  [96.7 F (35.9 C)-98.5 F (36.9 C)] 98.1 F (36.7 C) (01/12 1125) Pulse Rate:  [78-108] 98 (01/12 1125) Resp:  [9-19] 18 (01/12 1125) BP: (81-150)/(61-98) 143/79 (01/12 1125) SpO2:  [91 %-100 %] 97 % (01/12 1125) Weight:  [48.1 kg] 48.1 kg (01/12 0755)  Weight change:  Filed Weights   11/01/19 0500 11/02/19 1044 11/03/19 0755  Weight: 44.5 kg 44.5 kg 48.1 kg    Intake/Output: I/O last 3 completed shifts: In: 758.1 [P.O.:30; I.V.:20.2; IV Piggyback:707.9] Out: -    Intake/Output this shift:  Total I/O In: 0.8 [I.V.:0.8] Out: -   Physical Exam: General: NAD, laying in bed  Head: Normocephalic, atraumatic. Moist oral mucosal membranes  Eyes: Anicteric, PERRL  Neck: Supple, trachea midline  Lungs:  Clear to auscultation  Heart: tachycardia  Abdomen:  Soft, nontender,   Extremities: Right upper extremity tenderness and edema  Neurologic: Nonfocal, moving all four extremities  Skin: No lesions  Access: RIJ permcath, left AVG    Basic Metabolic Panel: Recent Labs  Lab 10/28/19 0445 10/29/19 0206 10/30/19 0430 10/30/19 1921 10/31/19 0532 10/31/19 1609 11/01/19 0407 11/03/19 0840  NA 132*  --  136 134* 135 136 138 139  K 4.7  --  4.6 4.4 4.6 4.5 4.0 4.4  CL 87*  --  103 104 103 102 102 107  CO2 30  --  24 21* '26 22 25 23  ' GLUCOSE 125*  --  135* 144* 128* 154* 110* 114*  BUN 72*  --  22* 21* 18 25* 14 47*   CREATININE 5.80*  --  1.29* 1.20 1.11 1.83* 1.34* 3.17*  CALCIUM 8.2*  --  7.6* 7.8* 8.2* 8.6* 8.6* 7.6*  MG 1.8 2.4 2.2  --  2.3  --  2.0  --   PHOS 8.3*  --  2.2* 1.6* 1.8* 4.0 2.4*  --     Liver Function Tests: Recent Labs  Lab 10/27/19 1823 10/28/19 0445 10/30/19 0430 10/30/19 1921 10/31/19 0532 10/31/19 1609 11/01/19 0407  AST 29 37  --   --   --   --   --   ALT 14 19  --   --   --   --   --   ALKPHOS 63 58  --   --   --   --   --   BILITOT 1.9* 2.5*  --   --   --   --   --   PROT 5.0* 4.3*  --   --   --   --   --   ALBUMIN 2.1* 2.0* 2.1* 2.1* 2.4* 2.5* 2.5*   No results for input(s): LIPASE, AMYLASE in the last 168 hours. No results for input(s): AMMONIA in the last 168 hours.  CBC: Recent Labs  Lab 10/28/19 0756 10/31/19 0050 10/31/19 0532 11/01/19 0407 11/02/19 0726 11/03/19 0840  WBC 8.7 12.8* 13.0* 18.1* 12.1* 11.9*  NEUTROABS 7.7  --   --   --   --   --  HGB 7.3* 6.5* 8.7* 9.2* 8.5* 7.3*  HCT 22.4* 22.7* 28.5* 30.0* 28.8* 24.4*  MCV 91.8 99.1 94.1 94.9 97.0 97.2  PLT 50* 58* 62* 100* 89* 108*    Cardiac Enzymes: No results for input(s): CKTOTAL, CKMB, CKMBINDEX, TROPONINI in the last 168 hours.  BNP: Invalid input(s): POCBNP  CBG: Recent Labs  Lab 11/02/19 0549 11/02/19 1825 11/02/19 2321 11/03/19 0525 11/03/19 1136  GLUCAP 160* 158* 105* 103* 139*    Microbiology: Results for orders placed or performed during the hospital encounter of 10/27/19  Blood Culture (routine x 2)     Status: Abnormal   Collection Time: 10/27/19  1:07 PM   Specimen: BLOOD  Result Value Ref Range Status   Specimen Description   Final    BLOOD BLOOD RIGHT HAND Performed at [facility], [address], [place], [place] [address]    Special Requests   Final    BOTTLES DRAWN AEROBIC AND ANAEROBIC Blood Culture results may not be optimal due to an inadequate volume of blood received in culture bottles Performed at [facility], [address], [place], [place] [address]    Culture  Setup Time   Final    GRAM POSITIVE RODS AEROBIC BOTTLE ONLY CRITICAL RESULT CALLED TO, READ BACK BY AND VERIFIED WITH: SCOTT HALL 10/28/19 @ 2210  [place] Performed at [facility], [address] [place], [place] [address]    Culture CLOSTRIDIUM SPECIES (A)  Final   Report Status 10/31/2019 FINAL  Final  Blood Culture (routine x 2)     Status: None   Collection Time: 10/27/19  1:07 PM   Specimen: BLOOD  Result Value Ref Range Status   Specimen Description BLOOD BLOOD LEFT HAND  Final   Special Requests   Final    BOTTLES DRAWN AEROBIC ONLY Blood Culture results may not be optimal due to an inadequate volume of blood received in culture bottles   Culture   Final    NO GROWTH 5 DAYS Performed at [facility], [address] [place], [place] [address]    Report Status 11/01/2019 FINAL  Final  SARS CORONAVIRUS 2 (TAT 6-24 HRS) Nasopharyngeal Nasopharyngeal Swab     Status: None   Collection Time: 10/27/19  3:54 PM   Specimen: Nasopharyngeal Swab  Result Value Ref Range Status   SARS Coronavirus 2 NEGATIVE NEGATIVE Final    Comment: (NOTE) SARS-CoV-2 target nucleic acids are NOT DETECTED. The SARS-CoV-2 RNA is generally detectable in upper and lower respiratory specimens during the acute phase of infection. Negative results do not preclude SARS-CoV-2 infection, do not rule out co-infections with other pathogens, and should not be used as the sole basis for treatment or other patient management decisions. Negative results must be combined with clinical observations, patient history, and epidemiological information. The expected result is Negative. Fact Sheet for Patients: SugarRoll.be Fact Sheet for Healthcare Providers: https://www.woods-mathews.com/ This test is not yet approved or cleared by the [place] FDA and  has been authorized for detection and/or diagnosis of  SARS-CoV-2 by FDA under an Emergency Use Authorization (EUA). This EUA will remain  in effect (meaning this test can be used) for the duration of the COVID-19 declaration under Section 56 4(b)(1) of the Act, 21 U.S.C. section 360bbb-3(b)(1), unless the authorization is terminated or revoked sooner. Performed at [facility], [place] [address] [place], [place] [address]   MRSA PCR Screening     Status: Abnormal   Collection Time:  10/27/19  9:52 PM   Specimen: Nasopharyngeal  Result Value Ref Range Status   MRSA by PCR POSITIVE (A) NEGATIVE Final    Comment:        The GeneXpert MRSA Assay (FDA approved for NASAL specimens only), is one component of a comprehensive MRSA colonization surveillance program. It is not intended to diagnose MRSA infection nor to guide or monitor treatment for MRSA infections. RESULT CALLED TO, READ BACK BY AND VERIFIED WITH: Shary Key RN [phone] 10/27/19 HNM Performed at [facility], [address] [place], [place] [address]     Coagulation Studies: No results for input(s): LABPROT, INR in the last 72 hours.  Urinalysis: No results for input(s): COLORURINE, LABSPEC, PHURINE, GLUCOSEU, HGBUR, BILIRUBINUR, KETONESUR, PROTEINUR, UROBILINOGEN, NITRITE, LEUKOCYTESUR in the last 72 hours.  Invalid input(s): APPERANCEUR    Imaging: PERIPHERAL VASCULAR CATHETERIZATION  Result Date: 11/02/2019 See op note    Medications:   . sodium chloride Stopped (11/01/19 0138)  . argatroban 0.1 mcg/kg/min (11/03/19 0740)  . ceFEPime (MAXIPIME) IV Stopped (11/02/19 2106)  . citrate dextrose    . doxycycline (VIBRAMYCIN) IV Stopped (11/02/19 2326)  . vancomycin     . carvedilol  25 mg Oral BID WC  . chlorhexidine gluconate (MEDLINE KIT)  15 mL Mouth Rinse BID  . Chlorhexidine Gluconate Cloth  6 each Topical Daily  . epoetin (EPOGEN/PROCRIT) injection  10,000 Units Intravenous Q T,Th,Sa-HD  . feeding supplement (NEPRO CARB STEADY)  237 mL  Oral BID BM  . hydrocortisone sod succinate (SOLU-CORTEF) inj  50 mg Intravenous Q6H  . insulin aspart  0-9 Units Subcutaneous Q6H  . mouth rinse  15 mL Mouth Rinse QID  . multivitamin  1 tablet Oral QHS  . pantoprazole sodium  40 mg Per Tube QHS  . sodium chloride flush  10-40 mL Intracatheter Q12H   sodium chloride, acetaminophen **OR** acetaminophen, bisacodyl, citrate dextrose, fentaNYL (SUBLIMAZE) injection, hydrALAZINE, HYDROmorphone (DILAUDID) injection, LORazepam, magnesium citrate, ondansetron **OR** ondansetron (ZOFRAN) IV, ondansetron (ZOFRAN) IV, polyethylene glycol, sodium chloride flush, traMADol  Assessment/ Plan:  Brett Small is a 44 y.o. white male with end stage renal disease on hemodialysis, 44 y.o. male with end-stage renal disease on hemodialysis, hypertension, peripheral vascular disease, depression, tobacco abuse, admitted to [facility] ICU on 10/27/2019 for Cardiac arrest [facility]) [I46.9] Hyperkalemia [E87.5] Difficult intubation [T88.4XXA] Sepsis due to pneumonia ([place]) [J18.9, A41.9] Sepsis, due to unspecified organism, unspecified whether acute organ dysfunction present ([place]) [A41.9]   Placed on CRRT from 1/5 -1/9. Now off vasopressors. Transitioned to intermittent hemodialysis on 1/9  [place] TTS RIJ catheter  1.  ESRD on HD TTS.   - Complication of dialysis device. Clotted arterial line of permcath. Replaced on 1/11 AVG is thrombosed and unusable.  - Seen and examined on hemodialysis treatment. Tolerating treatment well.   2.  Anemia of chronic kidney disease.  Hemoglobin 8.5 With thrombocytopenia . HIT negative. Argatroban gtt.  - EPO with HD treatment  3. Hypertension: hemodynamically stable off vasopressors. Home regimen of amlodipine, carvedilol, and hydralazine.   4.  Secondary hyperparathyroidism with hypophosphatemia - start sevelamer  5. Right upper extremity: with thrombocytopenia - status post thrombectomy on 1/11 Dr. Lucky Cowboy HIT  negative - argatroban.  - Appreciate vascular input   LOS: 7 Brett Small 1/12/202111:53 AM

## 2019-11-03 NOTE — Progress Notes (Signed)
[facility] Daily Progress Note  Subjective: 11/02/19: 1. Ultrasound guidance vascular access to right femoral artery.  Ultrasound guidance for vascular access to right ulnar artery 2. Catheter placement to the right ulnar artery  from right femoral approach. 3.  Catheter placement to right brachial artery from right ulnar approach 4. Thoracic aortogram and selective right upper extremity angiogram  including selective images of the ulnar artery. 5.  Mechanical thrombectomy of the right brachial artery and right ulnar artery with the penumbra cat 6 device 6.  Percutaneous transluminal angioplasty of the right ulnar artery with 3 mm diameter angioplasty balloon 7.  Stent placement to the right brachial artery with 6 mm diameter by 5 cm length Viabahn stent 8. StarClose closure device right femoral artery.  11/02/19: 1. Fluoroscopic guidance for placement of catheter 2. Placement of a 19 cm tip to cuff tunneled hemodialysis catheter via the right internal jugular vein and removal of previous catheter  Objective: Vitals:   11/03/19 1045 11/03/19 1100 11/03/19 1103 11/03/19 1125  BP: 137/82 137/80 (!) 142/76 (!) 143/79  Pulse: 99 (!) 101 (!) 101 98  Resp: (!) 9 11  18   Temp:    98.1 F (36.7 C)  TempSrc:    Oral  SpO2:    97%  Weight:      Height:        Intake/Output Summary (Last 24 hours) at 11/03/2019 1428 Last data filed at 11/03/2019 1300 Gross per 24 hour  Intake 988.81 ml  Output --  Net 988.81 ml   Physical Exam: A&Ox3, NAD Chest:   Right IJ Permcath: Intact, clean and dry. No swelling or drainage. CV: RRR Pulmonary: CTA Bilaterally Abdomen: Soft, Nontender, Nondistended Vascular:  Right Upper Extremity: Upper arm soft, forearm / hand with edema. Hard to palpate pulses however improved color and temperature. Motor / sensory intact.    Laboratory: CBC    Component Value Date/Time   WBC 11.9 (H) 11/03/2019 0840   HGB 7.3 (L) 11/03/2019  0840   HCT 24.4 (L) 11/03/2019 0840   PLT 108 (L) 11/03/2019 0840   BMET    Component Value Date/Time   NA 139 11/03/2019 0840   K 4.4 11/03/2019 0840   CL 107 11/03/2019 0840   CO2 23 11/03/2019 0840   GLUCOSE 114 (H) 11/03/2019 0840   BUN 47 (H) 11/03/2019 0840   CREATININE 3.17 (H) 11/03/2019 0840   CALCIUM 7.6 (L) 11/03/2019 0840   GFRNONAA 22 (L) 11/03/2019 0840   GFRAA 26 (L) 11/03/2019 0840   Assessment/Planning: Patient is a 45 y.o.male who presents with non-functional dialysis catheter and ESRD who presented to the hospital with cardiac arrest and was noted to have right upper extremity ischemia   1) Right Hand Ischemia:  Improved color and temperature on exam today. Recommend elevation. Agree with Eliquis. Will continue to follow in outpatient setting.  2) ESRD: Permcath exchanged. Had dialysis yesterday - functioning well.   Discussed with Dr. Ellis Parents Araiyah Cumpton PA-C 11/03/2019 2:28 PM

## 2019-11-03 NOTE — Progress Notes (Signed)
Pharmacy Heparin Induced Thrombocytopenia (HIT) Note:  Brett Small is an 44 y.o. male being evaluated for HIT. Heparin was started 1/5 @ 2135 for DVT prophylaxis, and baseline platelets were 133 1/5 @ 1307.   HIT labs were ordered on 1/6 @ 0756 when platelets dropped to 50.  Auto-populate labs:  Heparin Induced Plt Ab  Date/Time Value Ref Range Status  10/28/2019 07:56 AM 0.103 0.000 - 0.400 OD Final    Comment:    (NOTE) Performed At: [facility] [place], [place] [id number] Rush Farmer MD [id number]    [place] .2 IU/mL UFH Ser-aCnc  Date/Time Value Ref Range Status  10/28/2019 07:56 AM <1 0 - 20 % Final    Comment:    (NOTE) This test was developed and its performance characteristics determined by [facility]. It has not been cleared or approved by the Food and Drug Administration.    SRA 100IU/mL UFH Ser-aCnc  Date/Time Value Ref Range Status  10/28/2019 07:56 AM <1 0 - 20 % Final    Comment:    (NOTE) This test was developed and its performance characteristics determined by [facility]. It has not been cleared or approved by the Food and Drug Administration.     Recommendations (A or B) are based on available lab results (HIT antibody and/or SRA) and the HIT algorithm    A. HIT antibody result available  HIT ruled out    No SRA needed (but was already ordered)  Continue heparin / LMWH  No allergy documentation needed  B. SRA result availability  SRA negative and HIT ruled out:    Restart heparin / LMWH  Remove heparin allergy if applicable  Name of MD Contacted: [place] (Discussed with provider) Heparin allergy:  No allergy documentation needed. Anticoagulation plans:  D/C Bivalrudin and Start VTE treatment dose of Eliquis   Comments (List any alternative plans or if there are contraindications to therapy)   Lu Duffel, PharmD, BCPS Clinical Pharmacist 11/03/2019 11:57 AM

## 2019-11-03 NOTE — Progress Notes (Signed)
Pre HD Tx   11/03/19 0755  Hand-Off documentation  Report given to (Full Name) Beatris Ship, RN   Report received from (Full Name) Osvaldo Human, RN   Vital Signs  Temp (!) 96.7 F (35.9 C)  Temp Source Oral  Pulse Rate (!) 101  Pulse Rate Source Monitor  Resp 16  BP (!) 144/94  BP Location Right Leg  BP Method Automatic  Patient Position (if appropriate) Lying  Oxygen Therapy  SpO2 100 %  Pulse Oximetry Type Continuous  Pain Assessment  Pain Scale 0-10  Pain Score 0  Dialysis Weight  Weight 48.1 kg  Type of Weight Pre-Dialysis  Time-Out for Hemodialysis  What Procedure? HD   Pt Identifiers(min of two) First/Last Name;MRN/Account#  Correct Site? Yes  Correct Side? Yes  Correct Procedure? Yes  Consents Verified? Yes  Rad Studies Available? Yes  Safety Precautions Reviewed? Yes  Engineer, civil (consulting) Number 1  Station Number 2  UF/Alarm Test Passed  Conductivity: Meter 14.2  Conductivity: Machine  14.1  pH 7.2  Reverse Osmosis Main   Normal Saline Lot Number [id number]  Dialyzer Lot Number [id number]  Disposable Set Lot Number [id number]  Machine Temperature 98.6 F (28 C)  Musician and Audible Yes  Pre Treatment Patient Checks  Vascular access used during treatment Catheter  HD catheter dressing before treatment WDL  Hepatitis B Surface Antigen Results Negative  Date Hepatitis B Surface Antigen Drawn 10/03/19  Hepatitis B Surface Antibody  (<10)  Hemodialysis Consent Verified Yes  Hemodialysis Standing Orders Initiated Yes  ECG (Telemetry) Monitor On Yes  Length of  DialysisTreatment -hour(s) 3 Hour(s)  Dialysis Treatment Comments Na 140  Dialyzer Elisio 17H NR  Dialysate 2K;2.5 Ca  Dialysis Anticoagulant None  Dialysate Flow Ordered 600  Blood Flow Rate Ordered 400 mL/min  Ultrafiltration Goal 1.5 Liters  Dialysis Blood Pressure Support Ordered Normal Saline  Education / Care Plan  Dialysis Education Provided Yes  Documented Education in Care  Plan Yes  Fistula / Graft Left Upper arm Arteriovenous vein graft  Placement Date/Time: 12/26/18 1024   Placed prior to admission: No  Orientation: Left  Access Location: Upper arm  Access Type: (c) Arteriovenous vein graft  Removal Reason: Other (Comment)  Site Condition No complications  Hemodialysis Catheter Right Internal jugular Double lumen Permanent (Tunneled)  Placement Date/Time: 11/02/19 1319   Time Out: Correct patient;Correct site;Correct procedure  Maximum sterile barrier precautions: Hand hygiene;Cap;Mask;Sterile gown;Sterile gloves;Large sterile sheet  Site Prep: Chlorhexidine (preferred)  Local Anes...  Site Condition No complications  Blue Lumen Status Blood return noted  Red Lumen Status Blood return noted  Purple Lumen Status N/A  Dressing Type Gauze/Drain sponge  Dressing Status Clean;Dry;Intact  Drainage Description None

## 2019-11-03 NOTE — Progress Notes (Signed)
FS glu 107, no insulin coverage needed

## 2019-11-03 NOTE — Progress Notes (Signed)
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   Estimated Creatinine Clearance: 43.8 mL/min (A) (by C-G formula based on SCr of 1.34 mg/dL (H)).   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  Patient's is in critical status with multiorgan failure, poor prognosis, and he has high risk for subsequent cardiac arrest. 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 HIT antibody negative, SRA pending  Goal of Therapy:  Monitor platelets by anticoagulation protocol: Yes  Target aPTT 50-90 seconds  Course of Therapy Argatroban initiated at 0.5 mcg/kg/min for ICU patients per protocol. 0106 1137 aPTT 113s: dec infusion to 0.35 mcgkg/min 0106 1443 aPTT 107s: dec infusion to 0.30 mcg/kg/min 0106 2206 aPTT 92s: no change 0107 0206 aPTT 110s: dec infusion to 0.20 mcg/kg/min 0107 0726 aPTT 108s: dec infusion to 0.15 mcg/kg/min 0107 1241 aPTT 94s: dec infusion to 0.10 mcg/kg/min 0107 1815 aPTT 88s: continue infusion at 0.10 mcg/kg/min 0107 2200 aPTT 75s: continue infusion at 0.78mcg/kg/min 0108 0943 aPTT 76s: continue infusion at 0.40mcg/kg/min 0109 0532 aPTT 51s: continue infusion at 0.10 mcg/kg/min  0109 1609 aPTT 60s: continue infusion  at 0.10 mcg/kg/min 0110 0407 aPTT 62s: continue infusion at 0.10 mcg/kg/min. 0111 0726 aPTT 55s 0112 0840 aPTT 59s  Plan:  Continue argatroban at current rate (0.10 mcg/kg/min). No s/sx of bleeding noted per RN.   Will recheck aPTT and CBC with AM labs  Pharmacy will continue to follow.   Lu Duffel, PharmD, BCPS Clinical Pharmacist 11/03/2019 8:26 AM

## 2019-11-03 NOTE — Progress Notes (Signed)
Pre HD Assessment    11/03/19 0750  Neurological  Level of Consciousness Alert  Orientation Level Oriented X4  Respiratory  Respiratory Pattern Regular;Unlabored  Chest Assessment Chest expansion symmetrical  Bilateral Breath Sounds Diminished;Coarse crackles  Cough Non-productive  Cardiac  Pulse Regular  Heart Sounds S1, S2  ECG Monitor Yes  Cardiac Rhythm NSR  Vascular  R Radial Pulse +2  L Radial Pulse +2  Edema Generalized  Psychosocial  Psychosocial (WDL) WDL  Patient Behaviors Calm;Cooperative  Needs Expressed Physical  Emotional support given Given to patient

## 2019-11-03 NOTE — Evaluation (Signed)
Physical Therapy Evaluation Patient Details Name: Brett Small MRN: [id number] DOB: 1975-12-29 Today's Date: 11/03/2019   History of Present Illness   44 y/o male here with cardiac arrest, has missed dialysis recently.  Is now s/p angioplasty with stend placement 11/02/19, was here a few months ago s/p excision of R AV graft and repair of brachial artery (08/26/19).  Clinical Impression  Pt at first difficult to encourage to get up and try walking, but once up he showed good effort and willingness to do some limited activity.  Pt with baseline L LE limp/hesitancy that necessitates significant reliance on the walker for WBing and balance (per pt he does not need a walker most of the time?) Pt does not c/o significantly of increased pain with activity, but ultimately reports feeling generally sore, unable to really push himself and being too tired to do the <50 ft he was able to do in his room.  Pt should be able to return to his boarding house once medically stable but will clearly need to use a walker and should follow along with HHPT to get back to a safer and more functional activity level.    Follow Up Recommendations Home health PT    Equipment Recommendations  None recommended by PT    Recommendations for Other Services       Precautions / Restrictions Precautions Precautions: Fall Restrictions Weight Bearing Restrictions: No      Mobility  Bed Mobility Overal bed mobility: Modified Independent             General bed mobility comments: Pt was able to get himself to sitting EOB w/o direct assist. Needed some extra time, cuing and encouragement complete   Transfers Overall transfer level: Needs assistance Equipment used: Rolling walker (2 wheeled) Transfers: Sit to/from Stand Sit to Stand: Min assist;Min guard         General transfer comment: Pt unable to rise independently on his first attempt, subsequent 2 attempts were successful after cuing for set up and hand  placement.  Ambulation/Gait Ambulation/Gait assistance: Min guard Gait Distance (Feet): 45 Feet Assistive device: Rolling walker (2 wheeled)       General Gait Details: Pt with slow and symmetric gait.  Pt did not put full weight through L LE the entire time (reports this as baseline since fall from roof in 2012) necessitating heavy use of walker/UEs and not allowing for a consistent cadence/speed.  Pt fatigued with the effort though O2 remained in the 90s his HR did increased to 110s  Stairs            Wheelchair Mobility    Modified Rankin (Stroke Patients Only)       Balance Overall balance assessment: Modified Independent                                           Pertinent Vitals/Pain Pain Assessment: (vague back/butt pain/stiffness)    Home Living Family/patient expects to be discharged to:: Group home Living Arrangements: Non-relatives/Friends               Additional Comments: boarding house, meals provided, has transport set up for dialysis.  Does live upstairs and does flight of steps daily    Prior Function Level of Independence: Independent with assistive device(s)         Comments: Mod indep with ADLs, household mobilization; endorses recent use  of RW with mobility.  Pt takes transport to dialysis TTS, is not out of the home much otherwise.     Hand Dominance        Extremity/Trunk Assessment   Upper Extremity Assessment Upper Extremity Assessment: Generalized weakness(L UE with some swelling grossly functional (though weak) t/o)    Lower Extremity Assessment Lower Extremity Assessment: Generalized weakness(L LE grossly 3-/5, R LE grossly 3+/5)       Communication   Communication: No difficulties  Cognition Arousal/Alertness: Awake/alert Behavior During Therapy: Flat affect Overall Cognitive Status: Within Functional Limits for tasks assessed                                        General  Comments      Exercises     Assessment/Plan    PT Assessment Patient needs continued PT services  PT Problem List Decreased strength;Decreased range of motion;Decreased activity tolerance;Decreased balance;Decreased safety awareness;Decreased knowledge of use of DME;Decreased mobility;Decreased coordination;Cardiopulmonary status limiting activity;Pain       PT Treatment Interventions DME instruction;Gait training;Stair training;Functional mobility training;Therapeutic activities;Therapeutic exercise;Balance training;Cognitive remediation    PT Goals (Current goals can be found in the Care Plan section)  Acute Rehab PT Goals Patient Stated Goal: get back to his boarding house PT Goal Formulation: With patient Time For Goal Achievement: 11/17/19 Potential to Achieve Goals: Fair    Frequency Min 2X/week   Barriers to discharge        Co-evaluation               AM-PAC PT "6 Clicks" Mobility  Outcome Measure Help needed turning from your back to your side while in a flat bed without using bedrails?: A Little Help needed moving from lying on your back to sitting on the side of a flat bed without using bedrails?: A Little Help needed moving to and from a bed to a chair (including a wheelchair)?: A Little Help needed standing up from a chair using your arms (e.g., wheelchair or bedside chair)?: A Little Help needed to walk in hospital room?: A Little Help needed climbing 3-5 steps with a railing? : A Lot 6 Click Score: 17    End of Session Equipment Utilized During Treatment: Gait belt Activity Tolerance: Patient limited by fatigue;Patient tolerated treatment well Patient left: with chair alarm set;with call bell/phone within reach Nurse Communication: Mobility status PT Visit Diagnosis: Muscle weakness (generalized) (M62.81);Difficulty in walking, not elsewhere classified (R26.2);Other abnormalities of gait and mobility (R26.89)    Time: 1530-1550 PT Time Calculation  (min) (ACUTE ONLY): 20 min   Charges:   PT Evaluation $PT Eval Low Complexity: 1 Low PT Treatments $Gait Training: 8-22 mins        Kreg Shropshire, DPT 11/03/2019, 5:16 PM

## 2019-11-03 NOTE — Consult Note (Addendum)
ANTICOAGULATION CONSULT NOTE - Initial Consult  Pharmacy Consult for Eliquis Indication: DVT  Allergies  Allergen Reactions  . Codeine Nausea Only    Patient questioned this (??)  . Sulfa Antibiotics Hives and Nausea And Vomiting    Patient Measurements: Height: 5\' 1"  (154.9 cm) Weight: 106 lb 0.7 oz (48.1 kg) IBW/kg (Calculated) : 52.3  Vital Signs: Temp: 98.1 F (36.7 C) (01/12 1125) Temp Source: Oral (01/12 1125) BP: 143/79 (01/12 1125) Pulse Rate: 98 (01/12 1125)  Labs: Recent Labs    10/31/19 1609 10/31/19 1609 11/01/19 0407 11/02/19 0726 11/03/19 0840  HGB  --    < > 9.2* 8.5* 7.3*  HCT  --   --  30.0* 28.8* 24.4*  PLT  --   --  100* 89* 108*  APTT 60*  --  62* 55* 59*  CREATININE 1.83*  --  1.34*  --  3.17*   < > = values in this interval not displayed.    Estimated Creatinine Clearance: 20 mL/min (A) (by C-G formula based on SCr of 3.17 mg/dL (H)).   Medical History: Past Medical History:  Diagnosis Date  . ESRD (end stage renal disease) ([place])   . Hypertension   . PAD (peripheral artery disease) ([facility])    Required aortofemoral stent-which had closed and had to redo the procedure and ischemia of limb.  . Peripheral vascular disease ([place])   . Renal disorder   . Secondary hyperparathyroidism of renal origin ([place])     Medications:  Pt on Eliquis prior to admission, with 2 doses of SQ heparin as inpatient, then switch to Bivalrudin for suspected HIT now ruled out (see HIT note)  Assessment: 44 yo male with R radial vein DVT with thrombocytopenia/clotted arterial line permacath.  Pharmacy has been consulted to initate and monitor treatment level dosing of Eliquis for DVT.  Goal of Therapy:  Monitor platelets by anticoagulation protocol: Yes   Plan:  D/C bivalrudin drip and initiate Eliquis 10mg  bid x 7 days, followed by Eliquis 5mg  bid  Addendum 1/13: Will monitor carefully for s/sx of bleeding given Child Pugh Score Class B due to elevated INR and  low albumin   Lu Duffel, PharmD, BCPS Clinical Pharmacist 11/03/2019 1:27 PM

## 2019-11-03 NOTE — Progress Notes (Signed)
Post HD TX Assessment   11/03/19 1105  Neurological  Level of Consciousness Alert  Orientation Level Oriented X4  Respiratory  Respiratory Pattern Regular;Unlabored  Chest Assessment Chest expansion symmetrical  Bilateral Breath Sounds Diminished  Cough None  Cardiac  Pulse Regular  Heart Sounds S1, S2  ECG Monitor Yes  Cardiac Rhythm NSR  Vascular  R Radial Pulse +2  L Radial Pulse +2  Edema Generalized  Psychosocial  Psychosocial (WDL) WDL  Patient Behaviors Calm;Cooperative  Needs Expressed Physical  Emotional support given Given to patient

## 2019-11-04 LAB — COMPREHENSIVE METABOLIC PANEL
ALT: 18 U/L (ref 0–44)
AST: 19 U/L (ref 15–41)
Albumin: 2.1 g/dL — ABNORMAL LOW (ref 3.5–5.0)
Alkaline Phosphatase: 57 U/L (ref 38–126)
Anion gap: 13 (ref 5–15)
BUN: 37 mg/dL — ABNORMAL HIGH (ref 6–20)
CO2: 28 mmol/L (ref 22–32)
Calcium: 7.9 mg/dL — ABNORMAL LOW (ref 8.9–10.3)
Chloride: 100 mmol/L (ref 98–111)
Creatinine, Ser: 2.85 mg/dL — ABNORMAL HIGH (ref 0.61–1.24)
GFR calc Af Amer: 30 mL/min — ABNORMAL LOW (ref >60)
GFR calc non Af Amer: 26 mL/min — ABNORMAL LOW (ref >60)
Glucose, Bld: 143 mg/dL — ABNORMAL HIGH (ref 70–99)
Potassium: 3.4 mmol/L — ABNORMAL LOW (ref 3.5–5.1)
Sodium: 141 mmol/L (ref 135–145)
Total Bilirubin: 1.7 mg/dL — ABNORMAL HIGH (ref 0.3–1.2)
Total Protein: 5.2 g/dL — ABNORMAL LOW (ref 6.5–8.1)

## 2019-11-04 LAB — CBC
HCT: 26 % — ABNORMAL LOW (ref 39.0–52.0)
Hemoglobin: 7.7 g/dL — ABNORMAL LOW (ref 13.0–17.0)
MCH: 28.7 pg (ref 26.0–34.0)
MCHC: 29.6 g/dL — ABNORMAL LOW (ref 30.0–36.0)
MCV: 97 fL (ref 80.0–100.0)
Platelets: 106 10*3/uL — ABNORMAL LOW (ref 150–400)
RBC: 2.68 MIL/uL — ABNORMAL LOW (ref 4.22–5.81)
RDW: 21.6 % — ABNORMAL HIGH (ref 11.5–15.5)
WBC: 10.4 10*3/uL (ref 4.0–10.5)
nRBC: 0.3 % — ABNORMAL HIGH (ref 0.0–0.2)

## 2019-11-04 LAB — GLUCOSE, CAPILLARY
Glucose-Capillary: 134 mg/dL — ABNORMAL HIGH (ref 70–99)
Glucose-Capillary: 139 mg/dL — ABNORMAL HIGH (ref 70–99)
Glucose-Capillary: 174 mg/dL — ABNORMAL HIGH (ref 70–99)
Glucose-Capillary: 183 mg/dL — ABNORMAL HIGH (ref 70–99)

## 2019-11-04 LAB — BASIC METABOLIC PANEL
Anion gap: 12 (ref 5–15)
BUN: 35 mg/dL — ABNORMAL HIGH (ref 6–20)
CO2: 29 mmol/L (ref 22–32)
Calcium: 7.9 mg/dL — ABNORMAL LOW (ref 8.9–10.3)
Chloride: 100 mmol/L (ref 98–111)
Creatinine, Ser: 2.59 mg/dL — ABNORMAL HIGH (ref 0.61–1.24)
GFR calc Af Amer: 33 mL/min — ABNORMAL LOW (ref >60)
GFR calc non Af Amer: 29 mL/min — ABNORMAL LOW (ref >60)
Glucose, Bld: 135 mg/dL — ABNORMAL HIGH (ref 70–99)
Potassium: 3.4 mmol/L — ABNORMAL LOW (ref 3.5–5.1)
Sodium: 141 mmol/L (ref 135–145)

## 2019-11-04 LAB — PROTIME-INR
INR: 2.7 — ABNORMAL HIGH (ref 0.8–1.2)
Prothrombin Time: 29 seconds — ABNORMAL HIGH (ref 11.4–15.2)

## 2019-11-04 MED ORDER — APIXABAN 5 MG PO TABS: 5.0000 mg | ORAL_TABLET | Freq: Two times a day (BID) | ORAL | 0 refills | Status: DC

## 2019-11-04 MED ORDER — DOXYCYCLINE HYCLATE 100 MG PO TABS
100.0000 mg | ORAL_TABLET | Freq: Two times a day (BID) | ORAL | Status: DC
  Administered 2019-11-04: 12:00:00 100 mg via ORAL
  Filled 2019-11-04: qty 1

## 2019-11-04 MED ORDER — APIXABAN 5 MG PO TABS: 10.0000 mg | ORAL_TABLET | Freq: Two times a day (BID) | ORAL | 0 refills | Status: DC

## 2019-11-04 NOTE — Progress Notes (Signed)
DISCHARGE NOTE:  Pt given discharge instructions. Pt verbalized understanding. Pt wheeled to car by staff.  

## 2019-11-04 NOTE — Discharge Summary (Addendum)
Physician Discharge Summary  Goldie Sansom [id number] DOB: 1976/06/10 DOA: 10/27/2019  PCP: Patient, [name]  Admit date: 10/27/2019 Discharge date: 11/04/2019  Admitted From: home Disposition:  Home w/ home health   Recommendations for Outpatient Follow-up:  1. Follow up with PCP in 1-2 weeks 2. F/u vascular surg, NP [facility] Shark, in 1 week 3. F/u nephro in 1-2 weeks   Home Health: yes Equipment/Devices:  Discharge Condition: stable CODE STATUS: full  Diet recommendation: Heart Healthy/Renal diet   Brief/Interim Summary: HPI was taken from Dr. Mortimer Fries: 44 y.o.malewith below list of previous medical conditions presents to the emergency department secondary to central chest discomfort, productive cough with "yellow-green sputum shortness of breath x3 days. Patient also admits to diarrhea. Patient denies any nausea or vomiting. Patient also admits to worsening right ankle pain and redness  He had missed HD as well Patient with elevated potassium,   This RN went in to get pt off the bedpan when pt stated that he could not breathe. This RN placed 2L BNC on the pt and called CN for assistance. Pt then began to agonal breathe and a code was called.    First CODE 11 minutes CPR ACLS protocol started Second CODE 10 minutes  Patient with recurrent cardiac arrest and is NOT a candidate for hypothermia Protocol  From Dr. Clementeen Graham: 44 year old male with ESRD on dialysis, chronic systolic CHF with EF [id number] presented to the ED on 10/26/2018 with chest pain, shortness of breath and vomiting.  He was found to have sepsis secondary to pneumonia, metabolic acidosis and NSTEMI.  He had PEA in the ED and CODE BLUE was called.  Patient received CPR and given 2 rounds of epinephrine and calcium gluconate after which ROSC was obtained.  Patient intubated and transferred to ED.  Successfully extubated on 1/8.   Patient also developed DVT of the right radial vein. Transferred to hospital service on  1/9.  From Dr. Jimmye Norman 11/03/18: Pt tolerated HD day prior as per nephro. Nephro cleared the pt from his standpoint to be d/c. Pt will continue on eliquis 10mg  BID x 7 days and then eliquis 5mg  BID. Also, PT saw the pt yesterday and recommended home health PT. Home health was set up prior to d/c by CM. Pt lives at a group home.   Discharge Diagnoses:  Principal Problem:   Sepsis due to pneumonia [facility]) Active Problems:   End stage renal disease on dialysis [facility])   Hypertension   Depression   NSTEMI (non-ST elevated myocardial infarction) ([place])   Pleural effusion   PVD (peripheral vascular disease) ([facility])   Chronic systolic CHF (congestive heart failure) ([facility])   Nausea & vomiting   Generalized weakness   Cardiac arrest ([place])  Sepsis due to pneumonia: Completed 7 days of antibiotic. Cultures negative. Resolved  S/p cardiac arrest: x2. ROSC was obtained.  Acute respiratory failure with hypoxia and hypercapnia: intubated and admitted to ICU. Extubated on 1/8. No longer requiring supplemental oxygen. Resolved   Elevated troponins: likely secondary to demand ischemia.  W/ severe cardiomyopathy.  D/c argatroban drip.  Continue on coreg & eliquis. Cardiology recommended medical management.  Right radial vein DVT with thrombocytopenia/clotted arterial line permacath: D/c argatroban.  HIT antibody negative. Continue on eliquis 10 mg BID x 7 days & then 5 mg BID   ESRD: on HD TTS. Clotted permacath, TPA given on 1/9.  He has left AV fistula (not matured for dialysis yet). Tunneled HD catheter placed by vascular 11/03/19. Tolerated HD  yesterday as per nephro.   Chronic systolic CHF: Euvolemic.  Volume management with dialysis.  Generalized weakness: PT recommended home health PT   Anemia of chronic kidney disease: w/ associated thrombocytopenia. No need for platelet or PRBCs transfusion at this time.   Essential hypertension: required pressors for sepsis with hypotension which has  resolved.  Amlodipine and hydralazine on hold.  Continue on carvedilol  Hyperglycemia: no hx of DM. Will continue to monitor   Discharge Instructions  Discharge Instructions    Diet - low sodium heart healthy   Complete by: As directed    Discharge instructions   Complete by: As directed    F/u PCP in 1-2 weeks; F/u w/ nephro in 1 week; F/u vascular surgery in 1 week   Increase activity slowly   Complete by: As directed      Allergies as of 11/04/2019      Reactions   Codeine Nausea Only   Patient questioned this (??)   Sulfa Antibiotics Hives, Nausea And Vomiting      Medication List    TAKE these medications   amLODipine 5 MG tablet Commonly known as: NORVASC Take 1 tablet (5 mg total) by mouth daily. Notes to patient: Not given during this hospital stay   apixaban 5 MG Tabs tablet Commonly known as: ELIQUIS Take 2 tablets (10 mg total) by mouth 2 (two) times daily for 6 days. What changed:   medication strength  how much to take   apixaban 5 MG Tabs tablet Commonly known as: ELIQUIS Take 1 tablet (5 mg total) by mouth 2 (two) times daily. Start taking on: November 10, 2019 What changed: You were already taking a medication with the same name, and this prescription was added. Make sure you understand how and when to take each.   atorvastatin 80 MG tablet Commonly known as: LIPITOR Take 1 tablet (80 mg total) by mouth at bedtime. Notes to patient: Not given during this hospital stay   carvedilol 25 MG tablet Commonly known as: COREG Take 1 tablet (25 mg total) by mouth 2 (two) times daily with a meal. What changed: Another medication with the same name was removed. Continue taking this medication, and follow the directions you see here.   feeding supplement (NEPRO CARB STEADY) Liqd Take 237 mLs by mouth 2 (two) times daily between meals.   gabapentin 100 MG capsule Commonly known as: NEURONTIN Take 1 capsule (100 mg total) by mouth 3 (three) times  daily. Notes to patient: Not given during this hospital stay   hydrALAZINE 25 MG tablet Commonly known as: APRESOLINE Take 1 tablet (25 mg total) by mouth every 8 (eight) hours. Notes to patient: Not given during this hospital stay   hydrOXYzine 25 MG tablet Commonly known as: ATARAX/VISTARIL Take 25 mg by mouth 3 (three) times daily as needed for anxiety. Notes to patient: Not given during this hospital stay   levETIRAcetam 500 MG tablet Commonly known as: KEPPRA Take 1 tablet (500 mg total) by mouth 2 (two) times daily. Notes to patient: Not given during this hospital stay   loratadine 10 MG tablet Commonly known as: CLARITIN Take 1 tablet (10 mg total) by mouth daily. Notes to patient: Not given during this hospital stay   b complex-C-folic acid 1 MG capsule Take 1 capsule by mouth daily after supper.   multivitamin Tabs tablet Take 1 tablet by mouth daily.   neomycin-bacitracin-polymyxin Oint Commonly known as: NEOSPORIN Apply 1 application topically 2 (two) times daily.  Notes to patient: Not given during this hospital stay   sevelamer carbonate 800 MG tablet Commonly known as: RENVELA Take 2 tablets (1,600 mg total) by mouth 3 (three) times daily with meals.      Follow-up Information    Kris Hartmann, NP Follow up on 11/13/2019.   Specialty: Vascular Surgery Why: Will need right upper extremity arterial duplex with visit.; @ 1:00 pm Contact information: [address] [place] [address] [phone]          Allergies  Allergen Reactions  . Codeine Nausea Only    Patient questioned this (??)  . Sulfa Antibiotics Hives and Nausea And Vomiting    Consultations:  Cardio,ICU, vascular surgery   Procedures/Studies: EEG  Result Date: 10/28/2019 Lora Havens, MD     10/28/2019  3:18 PM Patient Name: Stryker Jallow MRN: [id number] Epilepsy Attending: Lora Havens Referring Physician/Provider: Darel Hong, NP Date: 10/27/2018 Duration: 27.57  minutes Patient history: 44 year old male with history of seizure disorder now status post cardiac arrest.  EEG to evaluate for seizures Level of alertness: Comatose/sedated AEDs during EEG study: None Technical aspects: This EEG study was done with scalp electrodes positioned according to the 10-20 International system of electrode placement. Electrical activity was acquired at a sampling rate of 500Hz  and reviewed with a high frequency filter of 70Hz  and a low frequency filter of 1Hz . EEG data were recorded continuously and digitally stored. Description: EEG showed continuous generalized, maximal bitemporal 3 to 6 Hz theta-delta slowing.  EEG was reactive to noxious stimuli.  Hyperventilation photic summation were not performed. Abnormality -Continuous slow, generalized, maximal bitemporal IMPRESSION: This study is suggestive of nonspecific bitemporal cortical dysfunction as well as severe diffuse encephalopathy, nonspecific to etiology but could be secondary to sedation. No seizures or epileptiform discharges were seen throughout the recording. Lora Havens   DG Chest 1 View  Result Date: 10/27/2019 CLINICAL DATA:  Difficult intubation status post CPR EXAM: CHEST  1 VIEW COMPARISON:  October 27, 2019 at 12:19 p.m. FINDINGS: The heart size remains enlarged. There is a small to moderate-sized left-sided pleural effusion. There is a small right-sided pleural effusion. There is diffuse increased attenuation within the left lung field. Prominent interstitial lung markings are noted. There is a stable tunneled dialysis catheter on the right. The endotracheal tube terminates approximately 3 cm above the carina. There is no pneumothorax. IMPRESSION: 1. Lines and tubes as above. 2. Persistent bilateral pleural effusions, left greater than right. 3. Worsening left-sided airspace disease concerning for pneumonia. Electronically Signed   By: Constance Holster M.D.   On: 10/27/2019 20:07   DG Chest 2 View  Result  Date: 10/27/2019 CLINICAL DATA:  44 year old male with history of chest pain. Vomiting for the past 2 weeks. Diarrhea since yesterday. EXAM: CHEST - 2 VIEW COMPARISON:  Chest x-ray 10/03/2019. FINDINGS: Right internal jugular PermCath with tips terminating in the right atrium and superior cavoatrial junction. Left upper extremity vascular stent extending into the axillary region. Lung volumes are slightly low. Opacity at the left base may reflect atelectasis and/or consolidation. Small to moderate left pleural effusion, slightly decreased compared to the prior study. Right lung is clear. No right pleural effusion. No pneumothorax. No definite suspicious appearing pulmonary nodules or masses. No evidence of pulmonary edema. Mild cardiomegaly. Upper mediastinal contours are within normal limits. IMPRESSION: 1. Support apparatus and postoperative changes, as above. 2. Left lower lobe atelectasis and/or consolidation with superimposed small to moderate left pleural effusion, slightly decreased  compared to the prior study. Electronically Signed   By: Vinnie Langton M.D.   On: 10/27/2019 12:36   DG Ankle Complete Right  Result Date: 10/27/2019 CLINICAL DATA:  Chronic wound with cellulitis EXAM: RIGHT ANKLE - COMPLETE 3+ VIEW COMPARISON:  None. FINDINGS: Frontal, oblique, and lateral views obtained. There is evidence of old trauma involving the calcaneus with remodeling. No acute fracture or joint effusion is evident. There is narrowing of the posterior aspect of the ankle joint. There is arthropathy throughout the subtalar joint regions. No frank erosion or bony destruction evident. IMPRESSION: Evidence of old trauma involving the calcaneus. Arthropathy throughout the subtalar joints. There is narrowing of the posterior ankle joint. No frank bony destruction appreciated by radiography. No acute fracture or joint effusion evident. If there remains concern for osteomyelitis, MR or nuclear medicine three-phase bone scan  could be helpful for further assessment. Electronically Signed   By: Lowella Grip III M.D.   On: 10/27/2019 13:09   DG Abd 1 View  Result Date: 10/29/2019 CLINICAL DATA:  Orogastric tube placement EXAM: ABDOMEN - 1 VIEW COMPARISON:  Portable exam 1328 hours compared to 10/28/2019 FINDINGS: Nasogastric tube coiled in proximal stomach. Tips of dual lumen RIGHT jugular central venous catheter projects over cavoatrial junction and upper RIGHT atrium. Tip of endotracheal tube projects approximately 3.9 cm above carina. Enlargement of cardiac silhouette with atelectasis versus consolidation of LEFT lower lobe and associated small LEFT pleural effusion. Vascular stents identified in upper abdomen. IMPRESSION: Nasogastric tube coiled in proximal stomach. Persistent atelectasis versus consolidation LEFT lower lobe with associated small LEFT pleural effusion. Electronically Signed   By: Lavonia Dana M.D.   On: 10/29/2019 13:45   DG Abd 1 View  Result Date: 10/28/2019 CLINICAL DATA:  OG tube placement. EXAM: ABDOMEN - 1 VIEW COMPARISON:  Single-view of the abdomen 10/04/2019. FINDINGS: OG tube is in place with the tip in the distal stomach. IMPRESSION: As above. Electronically Signed   By: Inge Rise M.D.   On: 10/28/2019 11:56   CT HEAD WO CONTRAST  Result Date: 10/28/2019 CLINICAL DATA:  Cardiac arrest. EXAM: CT HEAD WITHOUT CONTRAST TECHNIQUE: Contiguous axial images were obtained from the base of the skull through the vertex without intravenous contrast. COMPARISON:  None. FINDINGS: Brain: There is no mass, hemorrhage or extra-axial collection. The size and configuration of the ventricles and extra-axial CSF spaces are normal. The brain parenchyma is normal, without acute or chronic infarction. Vascular: No abnormal hyperdensity of the major intracranial arteries or dural venous sinuses. No intracranial atherosclerosis. Skull: The visualized skull base, calvarium and extracranial soft tissues are normal.  Sinuses/Orbits: No fluid levels or advanced mucosal thickening of the visualized paranasal sinuses. No mastoid or middle ear effusion. The orbits are normal. IMPRESSION: Normal head CT. Electronically Signed   By: Ulyses Jarred M.D.   On: 10/28/2019 01:50   PERIPHERAL VASCULAR CATHETERIZATION  Result Date: 11/02/2019 See op note  US Venous Img Upper Uni Right(DVT)  Result Date: 10/28/2019 CLINICAL DATA:  Dialysis patient, right arm graft removed, skin changes and swelling. EXAM: RIGHT UPPER EXTREMITY VENOUS DOPPLER ULTRASOUND TECHNIQUE: Gray-scale sonography with graded compression, as well as color Doppler and duplex ultrasound were performed to evaluate the upper extremity deep venous system from the level of the subclavian vein and including the jugular, axillary, basilic, radial, ulnar and upper cephalic vein. Spectral Doppler was utilized to evaluate flow at rest and with distal augmentation maneuvers. COMPARISON:  None. FINDINGS: Contralateral Subclavian Vein: Respiratory  phasicity is normal and symmetric with the symptomatic side. No evidence of thrombus. Normal compressibility. Internal Jugular Vein: No evidence of thrombus. Normal compressibility, respiratory phasicity and response to augmentation. Subclavian Vein: No evidence of thrombus. Normal compressibility, respiratory phasicity and response to augmentation. Axillary Vein: No evidence of thrombus. Normal compressibility, respiratory phasicity and response to augmentation. Cephalic Vein: No evidence of thrombus. Normal compressibility, respiratory phasicity and response to augmentation. Basilic Vein: No evidence of thrombus. Normal compressibility, respiratory phasicity and response to augmentation. Brachial Veins: No evidence of thrombus. Normal compressibility, respiratory phasicity and response to augmentation. Radial Veins: The forearm radial vein demonstrates hypoechoic thrombus appearing occlusive. Vein noncompressible. Minor thrombus  burden without propagation into the brachial vein of the upper arm. Ulnar Veins: No evidence of thrombus. Normal compressibility, respiratory phasicity and response to augmentation. IMPRESSION: Right forearm radial vein DVT. Very low thrombus burden. No propagation into the brachial vein. Electronically Signed   By: Jerilynn Mages.  Shick M.D.   On: 10/28/2019 10:31   DG Chest Port 1 View  Result Date: 10/28/2019 CLINICAL DATA:  Acute respiratory failure EXAM: PORTABLE CHEST 1 VIEW COMPARISON:  Radiograph 10/27/2019 FINDINGS: Dual lumen right IJ approach dialysis catheter tip terminates at the level of the right atrium and superior cavoatrial junction. An endotracheal tube terminates low in the trachea, approximately 1 cm from the carina. Pacer pad overlies the left chest wall. Additional support devices project over the chest. Left axillary vascular stent is again seen. Lung volumes remain diminished with bilateral patchy airspace opacities similar on the left and slightly increasing on the right when compared to prior imaging. No pneumothorax. Suspect left pleural effusion. No visible right effusion. No acute osseous or soft tissue abnormality. IMPRESSION: 1. Endotracheal tube terminates low in the trachea, approximately 1 cm from the carina. Consider retraction 2-3 cm to position in the mid trachea. 2. Bilateral patchy airspace opacities similar on the left and slightly increasing on the right when compared to prior imaging. Worrisome for infectious consolidation versus edema. 3. Left pleural effusion. These results will be called to the ordering clinician or representative by the Radiologist Assistant, and communication documented in the PACS or zVision Dashboard. Electronically Signed   By: Lovena Le M.D.   On: 10/28/2019 03:13   ECHOCARDIOGRAM COMPLETE  Result Date: 10/29/2019   ECHOCARDIOGRAM REPORT   Patient Name:   EUGUNE LOCKNER Date of Exam: 10/29/2019 Medical Rec #:  [id number]      Height:       61.0 in  Accession #:    [id number]     Weight:       100.3 lb Date of Birth:  [birth year]      BSA:          1.41 m Patient Age:    7 years       BP:           None listed/None listed mmHg Patient Gender: M              HR:           74 bpm. Exam Location:  [facility] Procedure: 2D Echo, Cardiac Doppler and Color Doppler Indications:     bacteremia 790.7  History:         Patient has prior history of Echocardiogram examinations, most                  recent 08/04/2019. Risk Factors:Hypertension. PAD.  Sonographer:     Sherrie Sport RDCS (AE) Referring Phys:  [id number] [name] Diagnosing Phys: Bartholome Bill MD IMPRESSIONS  1. Left ventricular ejection fraction, by visual estimation, is 25 to 30%. The left ventricle has moderate to severely decreased function. Left ventricular septal wall thickness was mildly increased. Mildly increased left ventricular posterior wall thickness. There is mildly increased left ventricular hypertrophy.  2. Left ventricular diastolic parameters are consistent with Grade I diastolic dysfunction (impaired relaxation).  3. The left ventricle demonstrates global hypokinesis.  4. Global right ventricle has normal systolic function.The right ventricular size is mildly enlarged. No increase in right ventricular wall thickness.  5. Left atrial size was mildly dilated.  6. Right atrial size was mildly dilated.  7. Moderate pleural effusion in the left lateral region.  8. The pericardial effusion is circumferential.  9. Trivial pericardial effusion is present. 10. The mitral valve is grossly normal. Mild mitral valve regurgitation. 11. The tricuspid valve is grossly normal. 12. The aortic valve is tricuspid. Aortic valve regurgitation is trivial. 13. The pulmonic valve was not well visualized. Pulmonic valve regurgitation is trivial. 14. Moderately elevated pulmonary artery systolic pressure. FINDINGS  Left Ventricle: Left ventricular ejection fraction, by visual estimation, is 25 to 30%. The left ventricle  has moderate to severely decreased function. The left ventricle demonstrates global hypokinesis. Mildly increased left ventricular posterior wall thickness. There is mildly increased left ventricular hypertrophy. Left ventricular diastolic parameters are consistent with Grade I diastolic dysfunction (impaired relaxation). Right Ventricle: The right ventricular size is mildly enlarged. No increase in right ventricular wall thickness. Global RV systolic function is has normal systolic function. The tricuspid regurgitant velocity is 2.79 m/s, and with an assumed right atrial  pressure of 10 mmHg, the estimated right ventricular systolic pressure is moderately elevated at 41.1 mmHg. Left Atrium: Left atrial size was mildly dilated. Right Atrium: Right atrial size was mildly dilated Pericardium: Trivial pericardial effusion is present. The pericardial effusion is circumferential. There is no evidence of cardiac tamponade. There is a moderate pleural effusion in the left lateral region. Mitral Valve: The mitral valve is grossly normal. Mild mitral valve regurgitation. There is no evidence of mitral valve vegetation. Tricuspid Valve: The tricuspid valve is grossly normal. Tricuspid valve regurgitation is mild. Aortic Valve: The aortic valve is tricuspid. Aortic valve regurgitation is trivial. Aortic valve mean gradient measures 2.0 mmHg. Aortic valve peak gradient measures 4.2 mmHg. Aortic valve area, by VTI measures 1.81 cm. Pulmonic Valve: The pulmonic valve was not well visualized. Pulmonic valve regurgitation is trivial. Pulmonic regurgitation is trivial. Aorta: The aortic root is normal in size and structure. IAS/Shunts: No atrial level shunt detected by color flow Doppler.  LEFT VENTRICLE PLAX 2D LVIDd:         5.16 cm       Diastology LVIDs:         4.48 cm       LV e' lateral:   6.42 cm/s LV PW:         0.91 cm       LV E/e' lateral: 11.0 LV IVS:        0.99 cm       LV e' medial:    4.57 cm/s LVOT diam:     2.00  cm       LV E/e' medial:  15.4 LV SV:         36 ml LV SV Index:   25.63 LVOT Area:     3.14 cm  LV Volumes (MOD) LV area d, A4C:  22.10 cm LV area s, A4C:    20.70 cm LV major d, A4C:   5.96 cm LV major s, A4C:   6.08 cm LV vol d, MOD A4C: 67.5 ml LV vol s, MOD A4C: 58.7 ml LV SV MOD A4C:     67.5 ml RIGHT VENTRICLE RV Basal diam:  3.07 cm RV S prime:     8.49 cm/s TAPSE (M-mode): 2.8 cm LEFT ATRIUM           Index       RIGHT ATRIUM           Index LA diam:      3.35 cm 2.38 cm/m  RA Area:     16.40 cm LA Vol (A4C): 30.0 ml 21.30 ml/m RA Volume:   45.00 ml  31.95 ml/m  AORTIC VALVE                   PULMONIC VALVE AV Area (Vmax):    1.71 cm    PV Vmax:        0.47 m/s AV Area (Vmean):   1.89 cm    PV Peak grad:   0.9 mmHg AV Area (VTI):     1.81 cm    RVOT Peak grad: 3 mmHg AV Vmax:           103.00 cm/s AV Vmean:          63.450 cm/s AV VTI:            0.138 m AV Peak Grad:      4.2 mmHg AV Mean Grad:      2.0 mmHg LVOT Vmax:         56.20 cm/s LVOT Vmean:        38.200 cm/s LVOT VTI:          0.079 m LVOT/AV VTI ratio: 0.58  AORTA Ao Root diam: 2.60 cm MITRAL VALVE                        TRICUSPID VALVE MV Area (PHT): 3.89 cm             TR Peak grad:   31.1 mmHg MV PHT:        56.55 msec           TR Vmax:        289.00 cm/s MV Decel Time: 195 msec MV E velocity: 70.30 cm/s 103 cm/s  SHUNTS MV A velocity: 36.40 cm/s 70.3 cm/s Systemic VTI:  0.08 m MV E/A ratio:  1.93       1.5       Systemic Diam: 2.00 cm  Bartholome Bill MD Electronically signed by Bartholome Bill MD Signature Date/Time: 10/29/2019/10:46:08 AM    Final       Subjective: Pt c/o fatigue.  Discharge Exam: Vitals:   11/04/19 0746 11/04/19 1457  BP: (!) 145/105 118/81  Pulse: 91 85  Resp:    Temp: 97.7 F (36.5 C) 98.2 F (36.8 C)  SpO2: 100% 97%   Vitals:   11/03/19 1510 11/03/19 2358 11/04/19 0746 11/04/19 1457  BP: (!) 127/92 105/78 (!) 145/105 118/81  Pulse: 98 86 91 85  Resp: 17 16    Temp: 98.3 F (36.8 C) 97.6  F (36.4 C) 97.7 F (36.5 C) 98.2 F (36.8 C)  TempSrc: Oral Oral Oral Oral  SpO2: 98% 96% 100% 97%  Weight:      Height:  General: Pt is alert, awake, not in acute distress Cardiovascular:  S1/S2 +, no rubs, no gallops Respiratory: decreased breath sounds b/l otherwise clear.  no wheezing, no rhonchi Abdominal: Soft, NT, ND, bowel sounds + Extremities: no edema, no cyanosis    The results of significant diagnostics from this hospitalization (including imaging, microbiology, ancillary and laboratory) are listed below for reference.     Microbiology: Recent Results (from the past 240 hour(s))  Blood Culture (routine x 2)     Status: Abnormal   Collection Time: 10/27/19  1:07 PM   Specimen: BLOOD  Result Value Ref Range Status   Specimen Description   Final    BLOOD BLOOD RIGHT HAND Performed at [facility], [address] [place], [place] [address]    Special Requests   Final    BOTTLES DRAWN AEROBIC AND ANAEROBIC Blood Culture results may not be optimal due to an inadequate volume of blood received in culture bottles Performed at [facility], [address] [place], [place] [address]    Culture  Setup Time   Final    GRAM POSITIVE RODS AEROBIC BOTTLE ONLY CRITICAL RESULT CALLED TO, READ BACK BY AND VERIFIED WITH: SCOTT HALL 10/28/19 @ 66  [place] Performed at [facility], [address] [place], [place] [address]    Culture CLOSTRIDIUM SPECIES (A)  Final   Report Status 10/31/2019 FINAL  Final  Blood Culture (routine x 2)     Status: None   Collection Time: 10/27/19  1:07 PM   Specimen: BLOOD  Result Value Ref Range Status   Specimen Description BLOOD BLOOD LEFT HAND  Final   Special Requests   Final    BOTTLES DRAWN AEROBIC ONLY Blood Culture results may not be optimal due to an inadequate volume of blood received in culture bottles   Culture   Final    NO GROWTH 5 DAYS Performed at [facility], [address] [place], [place] [address]    Report Status 11/01/2019 FINAL  Final  SARS CORONAVIRUS 2 (TAT 6-24 HRS) Nasopharyngeal Nasopharyngeal Swab     Status: None   Collection Time: 10/27/19  3:54 PM   Specimen: Nasopharyngeal Swab  Result Value Ref Range Status   SARS Coronavirus 2 NEGATIVE NEGATIVE Final    Comment: (NOTE) SARS-CoV-2 target nucleic acids are NOT DETECTED. The SARS-CoV-2 RNA is generally detectable in upper and lower respiratory specimens during the acute phase of infection. Negative results do not preclude SARS-CoV-2 infection, do not rule out co-infections with other pathogens, and should not be used as the sole basis for treatment or other patient management decisions. Negative results must be combined with clinical observations, patient history, and epidemiological information. The expected result is Negative. Fact Sheet for Patients: SugarRoll.be Fact Sheet for Healthcare Providers: https://www.woods-mathews.com/ This test is not yet approved or cleared by the [place] FDA and  has been authorized for detection and/or diagnosis of SARS-CoV-2 by FDA under an Emergency Use Authorization (EUA). This EUA will remain  in effect (meaning this test can be used) for the duration of the COVID-19 declaration under Section 56 4(b)(1) of the Act, 21 U.S.C. section 360bbb-3(b)(1), unless the authorization is terminated or revoked sooner. Performed at [facility], [place] [address] [place],  [address]   MRSA PCR Screening     Status: Abnormal   Collection Time: 10/27/19  9:52 PM   Specimen: Nasopharyngeal  Result Value Ref Range Status   MRSA by PCR  POSITIVE (A) NEGATIVE Final    Comment:        The GeneXpert MRSA Assay (FDA approved for NASAL specimens only), is one component of a comprehensive MRSA colonization surveillance program. It is not intended to diagnose MRSA infection nor to guide or monitor treatment  for MRSA infections. RESULT CALLED TO, READ BACK BY AND VERIFIED WITH: CYBTHIA VASQUEZ RN [id number] 10/27/19 HNM Performed at [facility], [address] [place], [place] [address]      Labs: BNP (last 3 results) Recent Labs    07/20/19 1029 08/03/19 0925 08/05/19 0431  BNP >4,500.0* >4,500.0* 123XX123*   Basic Metabolic Panel: Recent Labs  Lab 10/29/19 0206 10/30/19 0430 10/30/19 1921 10/31/19 0532 10/31/19 1609 11/01/19 0407 11/03/19 0840 11/04/19 0430 11/04/19 0907  NA  --  136 134* 135 136 138 139 141 141  K  --  4.6 4.4 4.6 4.5 4.0 4.4 3.4* 3.4*  CL  --  103 104 103 102 102 107 100 100  CO2  --  24 21* 26 22 25 23 29 28   GLUCOSE  --  135* 144* 128* 154* 110* 114* 135* 143*  BUN  --  22* 21* 18 25* 14 47* 35* 37*  CREATININE  --  1.29* 1.20 1.11 1.83* 1.34* 3.17* 2.59* 2.85*  CALCIUM  --  7.6* 7.8* 8.2* 8.6* 8.6* 7.6* 7.9* 7.9*  MG 2.4 2.2  --  2.3  --  2.0  --   --   --   PHOS  --  2.2* 1.6* 1.8* 4.0 2.4*  --   --   --    Liver Function Tests: Recent Labs  Lab 10/30/19 1921 10/31/19 0532 10/31/19 1609 11/01/19 0407 11/04/19 0907  AST  --   --   --   --  19  ALT  --   --   --   --  18  ALKPHOS  --   --   --   --  57  BILITOT  --   --   --   --  1.7*  PROT  --   --   --   --  5.2*  ALBUMIN 2.1* 2.4* 2.5* 2.5* 2.1*   No results for input(s): LIPASE, AMYLASE in the last 168 hours. No results for input(s): AMMONIA in the last 168 hours. CBC: Recent Labs  Lab 10/31/19 0532 11/01/19 0407 11/02/19 0726 11/03/19 0840 11/04/19 0430  WBC 13.0* 18.1* 12.1* 11.9* 10.4  HGB 8.7* 9.2* 8.5* 7.3* 7.7*  HCT 28.5* 30.0* 28.8* 24.4* 26.0*  MCV 94.1 94.9 97.0 97.2 97.0  PLT 62* 100* 89* 108* 106*   Cardiac Enzymes: No results for input(s): CKTOTAL, CKMB, CKMBINDEX, TROPONINI in the last 168 hours. BNP: Invalid input(s): POCBNP CBG: Recent Labs  Lab 11/03/19 1136 11/03/19 1724 11/03/19 2359 11/04/19 0552 11/04/19 1136  GLUCAP 139* 142* 174* 139*  183*   D-Dimer No results for input(s): DDIMER in the last 72 hours. Hgb A1c No results for input(s): HGBA1C in the last 72 hours. Lipid Profile No results for input(s): CHOL, HDL, LDLCALC, TRIG, CHOLHDL, LDLDIRECT in the last 72 hours. Thyroid function studies No results for input(s): TSH, T4TOTAL, T3FREE, THYROIDAB in the last 72 hours.  Invalid input(s): FREET3 Anemia work up No results for input(s): VITAMINB12, FOLATE, FERRITIN, TIBC, IRON, RETICCTPCT in the last 72 hours. Urinalysis    Component Value Date/Time   COLORURINE AMBER (A) 08/05/2019 2049   APPEARANCEUR CLOUDY (A) 08/05/2019 2049   LABSPEC 1.023  08/05/2019 2049   PHURINE 6.0 08/05/2019 2049   GLUCOSEU NEGATIVE 08/05/2019 2049   HGBUR MODERATE (A) 08/05/2019 2049   [place] 08/05/2019 2049   [place] 08/05/2019 2049   PROTEINUR 100 (A) 08/05/2019 2049   NITRITE NEGATIVE 08/05/2019 2049   LEUKOCYTESUR SMALL (A) 08/05/2019 2049   Sepsis Labs Invalid input(s): PROCALCITONIN,  WBC,  LACTICIDVEN Microbiology Recent Results (from the past 240 hour(s))  Blood Culture (routine x 2)     Status: Abnormal   Collection Time: 10/27/19  1:07 PM   Specimen: BLOOD  Result Value Ref Range Status   Specimen Description   Final    BLOOD BLOOD RIGHT HAND Performed at [facility], [address], [place], [place] [address]    Special Requests   Final    BOTTLES DRAWN AEROBIC AND ANAEROBIC Blood Culture results may not be optimal due to an inadequate volume of blood received in culture bottles Performed at [facility], [address] [place], [place] [address]    Culture  Setup Time   Final    GRAM POSITIVE RODS AEROBIC BOTTLE ONLY CRITICAL RESULT CALLED TO, READ BACK BY AND VERIFIED WITH: SCOTT HALL 10/28/19 @ 44  [place] Performed at [facility], [address] [place], [place] [address]    Culture CLOSTRIDIUM SPECIES (A)  Final   Report Status 10/31/2019 FINAL   Final  Blood Culture (routine x 2)     Status: None   Collection Time: 10/27/19  1:07 PM   Specimen: BLOOD  Result Value Ref Range Status   Specimen Description BLOOD BLOOD LEFT HAND  Final   Special Requests   Final    BOTTLES DRAWN AEROBIC ONLY Blood Culture results may not be optimal due to an inadequate volume of blood received in culture bottles   Culture   Final    NO GROWTH 5 DAYS Performed at [facility], [address] [place], [place] [address]    Report Status 11/01/2019 FINAL  Final  SARS CORONAVIRUS 2 (TAT 6-24 HRS) Nasopharyngeal Nasopharyngeal Swab     Status: None   Collection Time: 10/27/19  3:54 PM   Specimen: Nasopharyngeal Swab  Result Value Ref Range Status   SARS Coronavirus 2 NEGATIVE NEGATIVE Final    Comment: (NOTE) SARS-CoV-2 target nucleic acids are NOT DETECTED. The SARS-CoV-2 RNA is generally detectable in upper and lower respiratory specimens during the acute phase of infection. Negative results do not preclude SARS-CoV-2 infection, do not rule out co-infections with other pathogens, and should not be used as the sole basis for treatment or other patient management decisions. Negative results must be combined with clinical observations, patient history, and epidemiological information. The expected result is Negative. Fact Sheet for Patients: SugarRoll.be Fact Sheet for Healthcare Providers: https://www.woods-mathews.com/ This test is not yet approved or cleared by the [place] FDA and  has been authorized for detection and/or diagnosis of SARS-CoV-2 by FDA under an Emergency Use Authorization (EUA). This EUA will remain  in effect (meaning this test can be used) for the duration of the COVID-19 declaration under Section 56 4(b)(1) of the Act, 21 U.S.C. section 360bbb-3(b)(1), unless the authorization is terminated or revoked sooner. Performed at [facility], [place] [address]  [place], [place] [address]   MRSA PCR Screening     Status: Abnormal   Collection Time: 10/27/19  9:52 PM   Specimen: Nasopharyngeal  Result Value Ref Range Status   MRSA by PCR POSITIVE (A)  NEGATIVE Final    Comment:        The GeneXpert MRSA Assay (FDA approved for NASAL specimens only), is one component of a comprehensive MRSA colonization surveillance program. It is not intended to diagnose MRSA infection nor to guide or monitor treatment for MRSA infections. RESULT CALLED TO, READ BACK BY AND VERIFIED WITH: Shary Key RN [phone] 10/27/19 HNM Performed at [facility], [address] [place], [place] [address]      Time coordinating discharge: Over 30 minutes  SIGNED:   Wyvonnia Dusky, MD  [facility] 11/04/2019, 4:11 PM Pager   If 7PM-7AM, please contact night-coverage [email or web address] Password [email or web address]

## 2019-11-04 NOTE — Care Management Important Message (Signed)
Important Message  Patient Details  Name: Brett Small MRN: [id number] Date of Birth: May 10, 1976   Medicare Important Message Given:  Yes     Juliann Pulse A Casmira Cramer 11/04/2019, 10:57 AM

## 2019-11-04 NOTE — Progress Notes (Signed)
[facility]  ROUNDING NOTE   Subjective:   Hemodialysis treatment yesterday. Tolerated treatment well. UF of 1 liter.   Objective:  Vital signs in last 24 hours:  Temp:  [97.6 F (36.4 C)-98.3 F (36.8 C)] 97.7 F (36.5 C) (01/13 0746) Pulse Rate:  [86-98] 91 (01/13 0746) Resp:  [16-17] 16 (01/12 2358) BP: (105-145)/(78-105) 145/105 (01/13 0746) SpO2:  [96 %-100 %] 100 % (01/13 0746)  Weight change: 3.6 kg Filed Weights   11/01/19 0500 11/02/19 1044 11/03/19 0755  Weight: 44.5 kg 44.5 kg 48.1 kg    Intake/Output: I/O last 3 completed shifts: In: 1928.8 [P.O.:710; I.V.:10.9; IV Piggyback:1207.9] Out: -    Intake/Output this shift:  Total I/O In: -  Out: 200 [Urine:200]  Physical Exam: General: NAD, laying in bed  Head: Normocephalic, atraumatic. Moist oral mucosal membranes  Eyes: Anicteric, PERRL  Neck: Supple, trachea midline  Lungs:  Clear to auscultation  Heart: regular  Abdomen:  Soft, nontender,   Extremities: Right upper extremity tenderness and edema   Neurologic: Nonfocal, moving all four extremities  Skin: No lesions  Access: RIJ permcath     Basic Metabolic Panel: Recent Labs  Lab 10/29/19 0206 10/30/19 0430 10/30/19 1921 10/31/19 0532 10/31/19 1609 11/01/19 0407 11/03/19 0840 11/04/19 0430 11/04/19 0907  NA  --  136 134* 135 136 138 139 141 141  K  --  4.6 4.4 4.6 4.5 4.0 4.4 3.4* 3.4*  CL  --  103 104 103 102 102 107 100 100  CO2  --  24 21* '26 22 25 23 29 28  ' GLUCOSE  --  135* 144* 128* 154* 110* 114* 135* 143*  BUN  --  22* 21* 18 25* 14 47* 35* 37*  CREATININE  --  1.29* 1.20 1.11 1.83* 1.34* 3.17* 2.59* 2.85*  CALCIUM  --  7.6* 7.8* 8.2* 8.6* 8.6* 7.6* 7.9* 7.9*  MG 2.4 2.2  --  2.3  --  2.0  --   --   --   PHOS  --  2.2* 1.6* 1.8* 4.0 2.4*  --   --   --     Liver Function Tests: Recent Labs  Lab 10/30/19 1921 10/31/19 0532 10/31/19 1609 11/01/19 0407 11/04/19 0907  AST  --   --   --   --  19  ALT  --   --    --   --  18  ALKPHOS  --   --   --   --  57  BILITOT  --   --   --   --  1.7*  PROT  --   --   --   --  5.2*  ALBUMIN 2.1* 2.4* 2.5* 2.5* 2.1*   No results for input(s): LIPASE, AMYLASE in the last 168 hours. No results for input(s): AMMONIA in the last 168 hours.  CBC: Recent Labs  Lab 10/31/19 0532 11/01/19 0407 11/02/19 0726 11/03/19 0840 11/04/19 0430  WBC 13.0* 18.1* 12.1* 11.9* 10.4  HGB 8.7* 9.2* 8.5* 7.3* 7.7*  HCT 28.5* 30.0* 28.8* 24.4* 26.0*  MCV 94.1 94.9 97.0 97.2 97.0  PLT 62* 100* 89* 108* 106*    Cardiac Enzymes: No results for input(s): CKTOTAL, CKMB, CKMBINDEX, TROPONINI in the last 168 hours.  BNP: Invalid input(s): POCBNP  CBG: Recent Labs  Lab 11/03/19 0525 11/03/19 1136 11/03/19 1724 11/03/19 2359 11/04/19 0552  GLUCAP 103* 139* 142* 174* 139*    Microbiology: Results for orders placed or performed during  the hospital encounter of 10/27/19  Blood Culture (routine x 2)     Status: Abnormal   Collection Time: 10/27/19  1:07 PM   Specimen: BLOOD  Result Value Ref Range Status   Specimen Description   Final    BLOOD BLOOD RIGHT HAND Performed at [facility], [address], [place], [place] [address]    Special Requests   Final    BOTTLES DRAWN AEROBIC AND ANAEROBIC Blood Culture results may not be optimal due to an inadequate volume of blood received in culture bottles Performed at [facility], [address], [place], [place] [address]    Culture  Setup Time   Final    GRAM POSITIVE RODS AEROBIC BOTTLE ONLY CRITICAL RESULT CALLED TO, READ BACK BY AND VERIFIED WITH: SCOTT HALL 10/28/19 @ 21  [place] Performed at [facility], [address] [place], [place] [address]    Culture CLOSTRIDIUM SPECIES (A)  Final   Report Status 10/31/2019 FINAL  Final  Blood Culture (routine x 2)     Status: None   Collection Time: 10/27/19  1:07 PM   Specimen: BLOOD  Result Value Ref Range Status   Specimen Description  BLOOD BLOOD LEFT HAND  Final   Special Requests   Final    BOTTLES DRAWN AEROBIC ONLY Blood Culture results may not be optimal due to an inadequate volume of blood received in culture bottles   Culture   Final    NO GROWTH 5 DAYS Performed at [facility], [address] [place], [place] [address]    Report Status 11/01/2019 FINAL  Final  SARS CORONAVIRUS 2 (TAT 6-24 HRS) Nasopharyngeal Nasopharyngeal Swab     Status: None   Collection Time: 10/27/19  3:54 PM   Specimen: Nasopharyngeal Swab  Result Value Ref Range Status   SARS Coronavirus 2 NEGATIVE NEGATIVE Final    Comment: (NOTE) SARS-CoV-2 target nucleic acids are NOT DETECTED. The SARS-CoV-2 RNA is generally detectable in upper and lower respiratory specimens during the acute phase of infection. Negative results do not preclude SARS-CoV-2 infection, do not rule out co-infections with other pathogens, and should not be used as the sole basis for treatment or other patient management decisions. Negative results must be combined with clinical observations, patient history, and epidemiological information. The expected result is Negative. Fact Sheet for Patients: SugarRoll.be Fact Sheet for Healthcare Providers: https://www.woods-mathews.com/ This test is not yet approved or cleared by the [place] FDA and  has been authorized for detection and/or diagnosis of SARS-CoV-2 by FDA under an Emergency Use Authorization (EUA). This EUA will remain  in effect (meaning this test can be used) for the duration of the COVID-19 declaration under Section 56 4(b)(1) of the Act, 21 U.S.C. section 360bbb-3(b)(1), unless the authorization is terminated or revoked sooner. Performed at [facility], [place] [address], [place], [place] [address]   MRSA PCR Screening     Status: Abnormal   Collection Time: 10/27/19  9:52 PM   Specimen: Nasopharyngeal  Result Value Ref Range Status    MRSA by PCR POSITIVE (A) NEGATIVE Final    Comment:        The GeneXpert MRSA Assay (FDA approved for NASAL specimens only), is one component of a comprehensive MRSA colonization surveillance program. It is not intended to diagnose MRSA infection nor to guide or monitor treatment for MRSA infections. RESULT CALLED TO, READ BACK BY AND VERIFIED WITH: Shary Key RN 2316 10/27/19 HNM Performed at  [facility], [address], [place], [place] [address]     Coagulation Studies: Recent Labs    11/04/19 0907  LABPROT 29.0*  INR 2.7*    Urinalysis: No results for input(s): COLORURINE, LABSPEC, PHURINE, GLUCOSEU, HGBUR, BILIRUBINUR, KETONESUR, PROTEINUR, UROBILINOGEN, NITRITE, LEUKOCYTESUR in the last 72 hours.  Invalid input(s): APPERANCEUR    Imaging: PERIPHERAL VASCULAR CATHETERIZATION  Result Date: 11/02/2019 See op note    Medications:   . sodium chloride Stopped (11/01/19 0138)  . citrate dextrose     . apixaban  10 mg Oral BID   Followed by  . [START ON 11/10/2019] apixaban  5 mg Oral BID  . carvedilol  25 mg Oral BID WC  . chlorhexidine gluconate (MEDLINE KIT)  15 mL Mouth Rinse BID  . Chlorhexidine Gluconate Cloth  6 each Topical Daily  . doxycycline  100 mg Oral Q12H  . epoetin (EPOGEN/PROCRIT) injection  10,000 Units Intravenous Q T,Th,Sa-HD  . feeding supplement (NEPRO CARB STEADY)  237 mL Oral BID BM  . hydrocortisone sod succinate (SOLU-CORTEF) inj  50 mg Intravenous Q6H  . insulin aspart  0-9 Units Subcutaneous Q6H  . mouth rinse  15 mL Mouth Rinse QID  . multivitamin  1 tablet Oral QHS  . pantoprazole sodium  40 mg Per Tube QHS  . sevelamer carbonate  1,600 mg Oral TID WC  . sodium chloride flush  10-40 mL Intracatheter Q12H   sodium chloride, acetaminophen **OR** acetaminophen, bisacodyl, citrate dextrose, fentaNYL (SUBLIMAZE) injection, hydrALAZINE, LORazepam, magnesium citrate, ondansetron **OR** ondansetron (ZOFRAN) IV, ondansetron  (ZOFRAN) IV, polyethylene glycol, sodium chloride flush, traMADol  Assessment/ Plan:  Mr. Tremar Wickens is a 44 y.o. white male with end stage renal disease on hemodialysis, 44 y.o. male with end-stage renal disease on hemodialysis, hypertension, peripheral vascular disease, depression, tobacco abuse, admitted to [facility] ICU on 10/27/2019 for Cardiac arrest [facility]) [I46.9] Hyperkalemia [E87.5] Difficult intubation [T88.4XXA] Sepsis due to pneumonia ([place]) [J18.9, A41.9] Sepsis, due to unspecified organism, unspecified whether acute organ dysfunction present ([place]) [A41.9]   Placed on CRRT from 1/5 -1/9. Now off vasopressors. Transitioned to intermittent hemodialysis on 1/9  [place]ane TTS RIJ catheter  1.  ESRD on HD TTS.   - Complication of dialysis device. Clotted arterial line of permcath. Replaced on 1/11 AVG is thrombosed and unusable.  - next treatment for tomorrow.   2.  Anemia of chronic kidney disease.  Hemoglobin 7.7 With thrombocytopenia . HIT negative. Argatroban gtt.  - EPO with HD treatment  3. Hypertension: hemodynamically stable off vasopressors. Home regimen of amlodipine, carvedilol, and hydralazine.   4.  Secondary hyperparathyroidism with hypophosphatemia - sevelamer  5. Right upper extremity: with thrombocytopenia - status post thrombectomy on 1/11 Dr. Lucky Cowboy HIT negative - transitioned to apixaban.  - Appreciate vascular input   LOS: 8 Brett Small 1/13/202111:37 AM

## 2019-11-04 NOTE — Progress Notes (Signed)
3 inch round spot of dried blood on pillow. Looks like left earlobe has been scratched and oozing blood. Area cleansed with CHG and Band-Aid over guaze applied.

## 2019-11-04 NOTE — Progress Notes (Signed)
PHARMACIST - PHYSICIAN COMMUNICATION DR:   Jimmye Norman CONCERNING: Antibiotic IV to Oral Route Change Policy  RECOMMENDATION: This patient is receiving doxycycline by the intravenous route.  Based on criteria approved by the Pharmacy and Therapeutics Committee, the antibiotic(s) is/are being converted to the equivalent oral dose form(s).   DESCRIPTION: These criteria include:  Patient being treated for a respiratory tract infection, urinary tract infection, cellulitis or clostridium difficile associated diarrhea if on metronidazole  The patient is not neutropenic and does not exhibit a GI malabsorption state  The patient is eating (either orally or via tube) and/or has been taking other orally administered medications for a least 24 hours  The patient is improving clinically and has a Tmax < 100.5  If you have questions about this conversion, please contact the [place], PharmD, BCPS Clinical Pharmacist 11/04/2019 10:01 AM

## 2019-11-04 NOTE — TOC Transition Note (Signed)
Transition of Care [facility]) - CM/SW Discharge Note   Patient Details  Name: Brett Small MRN: [id number] Date of Birth: Apr 01, 1976  [facility] CM/SW Contact:  Su Hilt, RN Phone Number: 11/04/2019, 1:35 PM   Clinical Narrative:    Met with the patient to discuss DC needs, he lives in a boarding house and wants to use [name] for Home health services, He has A RW at home and does not need additional equipment I notified Corene Cornea with [facility] about the request   Final next level of care: Home w Home Health Services Barriers to Discharge: Barriers Resolved   Patient Goals and CMS Choice   CMS Medicare.gov Compare Post Acute Care list provided to:: Patient Choice offered to / list presented to : Patient  Discharge Placement                       Discharge Plan and Services   Discharge Planning Services: CM Consult Post Acute Care Choice: Home Health                    HH Arranged: PT, OT, RN [facility] Agency: [place] ([facility]) Date [facility] Agency Contacted: 11/04/19 Time [place]: [phone] Representative spoke with at [name]: [name] Interventions     Readmission Risk Interventions Readmission Risk Prevention Plan 10/08/2019 08/28/2019 08/10/2019  Transportation Screening Complete Complete Complete  Medication Review Press photographer) Complete Complete Complete  PCP or Specialist appointment within 3-5 days of discharge - (No Data) -  PCP/Specialist Appt Not Complete comments - - -  [place] or Home Care Consult Complete Complete Complete  SW Recovery Care/Counseling Consult - - Complete  Palliative Care Screening - - Patient [place] Not Applicable Not Applicable -

## 2019-11-04 NOTE — TOC Progression Note (Signed)
Transition of Care [facility]) - Progression Note    Patient Details  Name: Nikoloz Kurowski MRN: [id number] Date of Birth: July 27, 1976  Transition of Care [facility]) CM/SW [place], RN Phone Number: 11/04/2019, 3:40 PM  Clinical Narrative:     Provided the patient with a Cab voucher to DC to the boarding house where he resides at [place] to the bedside nurse to call the taxi Expected Discharge Plan: [place] Barriers to Discharge: Barriers Resolved  Expected Discharge Plan and Services Expected Discharge Plan: [place]   Discharge Planning Services: CM Consult Post Acute Care Choice: [place] arrangements for the past 2 months: [place] Expected Discharge Date: 11/04/19                         HH Arranged: PT, OT, RN [place] Agency: [place] ([facility]) Date HH Agency Contacted: 11/04/19 Time [place]: [phone] Representative spoke with at [place]: [place] Determinants of Health (SDOH) Interventions    Readmission Risk Interventions Readmission Risk Prevention Plan 10/08/2019 08/28/2019 08/10/2019  Transportation Screening Complete Complete Complete  Medication Review Press photographer) Complete Complete Complete  PCP or Specialist appointment within 3-5 days of discharge - (No Data) -  PCP/Specialist Appt Not Complete comments - - -  [place] or Home Care Consult Complete Complete Complete  SW Recovery Care/Counseling Consult - - Complete  Palliative Care Screening - - Patient [place] Not Applicable Not Applicable -

## 2019-11-05 ENCOUNTER — Telehealth (INDEPENDENT_AMBULATORY_CARE_PROVIDER_SITE_OTHER): Payer: Self-pay | Admitting: Vascular Surgery

## 2019-11-05 NOTE — Telephone Encounter (Signed)
Spoke with the patient and gave him the recommendations from Eulogio Ditch NP. Per Arna Medici the patient's procedure does not usually warrant severe pain so the patient should take Tylenol and if that does not help to relieve his pain he may need to go to the ED to be evaluated. Patient was given the information and stated taking Tylenol was useless and he was not going to the ED because its a waste of time.

## 2019-11-10 ENCOUNTER — Other Ambulatory Visit (INDEPENDENT_AMBULATORY_CARE_PROVIDER_SITE_OTHER): Payer: Self-pay | Admitting: Vascular Surgery

## 2019-11-10 DIAGNOSIS — Z95820 Peripheral vascular angioplasty status with implants and grafts: Secondary | ICD-10-CM

## 2019-11-10 DIAGNOSIS — I998 Other disorder of circulatory system: Secondary | ICD-10-CM

## 2019-11-11 ENCOUNTER — Other Ambulatory Visit (INDEPENDENT_AMBULATORY_CARE_PROVIDER_SITE_OTHER): Payer: Medicare Other

## 2019-11-11 ENCOUNTER — Ambulatory Visit (INDEPENDENT_AMBULATORY_CARE_PROVIDER_SITE_OTHER): Payer: Medicare Other | Admitting: Nurse Practitioner

## 2019-11-13 ENCOUNTER — Other Ambulatory Visit: Payer: Self-pay

## 2019-11-13 ENCOUNTER — Encounter (INDEPENDENT_AMBULATORY_CARE_PROVIDER_SITE_OTHER): Payer: Self-pay | Admitting: Nurse Practitioner

## 2019-11-13 ENCOUNTER — Ambulatory Visit (INDEPENDENT_AMBULATORY_CARE_PROVIDER_SITE_OTHER): Payer: Medicare Other

## 2019-11-13 ENCOUNTER — Ambulatory Visit (INDEPENDENT_AMBULATORY_CARE_PROVIDER_SITE_OTHER): Payer: Medicare Other | Admitting: Nurse Practitioner

## 2019-11-13 VITALS — BP 109/80 | HR 88 | Resp 16

## 2019-11-13 DIAGNOSIS — I998 Other disorder of circulatory system: Secondary | ICD-10-CM | POA: Diagnosis not present

## 2019-11-13 DIAGNOSIS — I82621 Acute embolism and thrombosis of deep veins of right upper extremity: Secondary | ICD-10-CM

## 2019-11-13 DIAGNOSIS — Z720 Tobacco use: Secondary | ICD-10-CM | POA: Diagnosis not present

## 2019-11-13 DIAGNOSIS — Z95820 Peripheral vascular angioplasty status with implants and grafts: Secondary | ICD-10-CM | POA: Diagnosis not present

## 2019-11-13 MED ORDER — HYDROCODONE-ACETAMINOPHEN 5-325 MG PO TABS: 1.0000 | ORAL_TABLET | Freq: Four times a day (QID) | ORAL | 0 refills | Status: DC | PRN

## 2019-11-18 ENCOUNTER — Encounter (INDEPENDENT_AMBULATORY_CARE_PROVIDER_SITE_OTHER): Payer: Self-pay | Admitting: Nurse Practitioner

## 2019-11-18 NOTE — Progress Notes (Signed)
SUBJECTIVE:  Patient ID: Brett Small, male    DOB: [birth year], 44 y.o.   MRN: [id number] Chief Complaint  Patient presents with  . Follow-up    [facility] 1week     HPI  Brett Small is a 44 y.o. male that presents today after a right brachial and ulnar thrombectomy in addition to angioplasty with stent placement in the right brachial and ulnar artery.  The patient states that his arm feels better than it did before however it is still swollen and has some pain within it.  The patient is currently on dialysis and currently has a PermCath that is working without issue.  The patient is still left week following his most recent hospital stay.  He denies any fever, chills, nausea, vomiting or diarrhea.  He does continue to complain of having severe pain in his right upper extremity following intervention.  Today the patient had noninvasive studies which showed no obstruction visualized in the right upper extremity.  There is a patent new distal brachial stent with no evidence of restenosis.  The ulnar stent is also patent.  Past Medical History:  Diagnosis Date  . ESRD (end stage renal disease) ([place])   . Hypertension   . PAD (peripheral artery disease) (HCC)    Required aortofemoral stent-which had closed and had to redo the procedure and ischemia of limb.  . Peripheral vascular disease ([place])   . Renal disorder   . Secondary hyperparathyroidism of renal origin [facility])     Past Surgical History:  Procedure Laterality Date  . A/V SHUNT INTERVENTION Left 01/19/2019   Procedure: LEFT UPPER EXTREMITY A/V SHUNTOGRAM / UPPER EXTREMITY ANGIOGRAM;  Surgeon: Algernon Huxley, MD;  Location: [place] [facility];  Service: Cardiovascular;  Laterality: Left;  . A/V SHUNT INTERVENTION Left 03/02/2019   Procedure: A/V SHUNT INTERVENTION;  Surgeon: Algernon Huxley, MD;  Location: [place] [facility];  Service: Cardiovascular;  Laterality: Left;  . AORTA - FEMORAL ARTERY BYPASS GRAFT    . AV FISTULA PLACEMENT  Left 12/26/2018   Procedure: INSERTION OF GORE STRETCH VASCULAR 4-7MM X  45CM IN LEFT UPPER ARM;  Surgeon: Marty Heck, MD;  Location: [place];  Service: Vascular;  Laterality: Left;  . AV FISTULA PLACEMENT Right 08/12/2019   Procedure: INSERTION OF ARTERIOVENOUS (AV) GORE-TEX GRAFT ARM;  Surgeon: Algernon Huxley, MD;  Location: [facility];  Service: Vascular;  Laterality: Right;  . DIALYSIS/PERMA CATHETER REMOVAL N/A 02/06/2019   Procedure: DIALYSIS/PERMA CATHETER REMOVAL;  Surgeon: Algernon Huxley, MD;  Location: [place] [facility];  Service: Cardiovascular;  Laterality: N/A;  . ESOPHAGOGASTRODUODENOSCOPY N/A 10/07/2019   Procedure: ESOPHAGOGASTRODUODENOSCOPY (EGD);  Surgeon: Lin Landsman, MD;  Location: [facility];  Service: Gastroenterology;  Laterality: N/A;  . LEFT HEART CATH AND CORONARY ANGIOGRAPHY Right 07/10/2019   Procedure: LEFT HEART CATH AND CORONARY ANGIOGRAPHY;  Surgeon: Dionisio David, MD;  Location: [place] [facility];  Service: Cardiovascular;  Laterality: Right;  . PERIPHERAL VASCULAR THROMBECTOMY Left 07/28/2019   Procedure: Left Upper Extremity Dialysis Access [place];  Surgeon: Katha Cabal, MD;  Location: [place] [facility];  Service: Cardiovascular;  Laterality: Left;  . REMOVAL OF A DIALYSIS CATHETER Right 08/26/2019   Procedure: REMOVAL OF A DIALYSIS CATHETER;  Surgeon: Algernon Huxley, MD;  Location: [facility];  Service: Vascular;  Laterality: Right;  . TEMPORARY DIALYSIS CATHETER N/A 08/25/2019   Procedure: TEMPORARY DIALYSIS CATHETER;  Surgeon: Katha Cabal, MD;  Location: [place]  [facility];  Service: Cardiovascular;  Laterality: N/A;  . UPPER EXTREMITY ANGIOGRAPHY Left 08/13/2019   Procedure: UPPER EXTREMITY ANGIOGRAPHy;  Surgeon: Algernon Huxley, MD;  Location: [place] [facility];  Service: Cardiovascular;  Laterality: Left;  . UPPER EXTREMITY INTERVENTION Right 11/02/2019   Procedure: UPPER EXTREMITY INTERVENTION;  Surgeon: Algernon Huxley, MD;   Location: [place] [facility];  Service: Cardiovascular;  Laterality: Right;    Social History   Socioeconomic History  . Marital status: Single    Spouse name: Not on file  . Number of children: Not on file  . Years of education: Not on file  . Highest education level: Not on file  Occupational History  . Not on file  Tobacco Use  . Smoking status: Current Every Day Smoker    Packs/day: 0.50    Years: 30.00    Pack years: 15.00    Types: Cigarettes    Last attempt to quit: 06/26/2018    Years since quitting: 1.3  . Smokeless tobacco: Never Used  . Tobacco comment: smoked for 30 years   Substance and Sexual Activity  . Alcohol use: Not Currently  . Drug use: Yes    Frequency: 1.0 times per week    Types: Marijuana    Comment: Pt states "maybe once or twice a week".   . Sexual activity: Not on file  Other Topics Concern  . Not on file  Social History Narrative  . Not on file   Social Determinants of Health   Financial Resource Strain:   . Difficulty of Paying Living Expenses: Not on file  Food Insecurity:   . Worried About Charity fundraiser in the Last Year: Not on file  . Ran Out of Food in the Last Year: Not on file  Transportation Needs:   . Lack of Transportation (Medical): Not on file  . Lack of Transportation (Non-Medical): Not on file  Physical Activity:   . Days of Exercise per Week: Not on file  . Minutes of Exercise per Session: Not on file  Stress:   . Feeling of Stress : Not on file  Social Connections:   . Frequency of Communication with Friends and Family: Not on file  . Frequency of Social Gatherings with Friends and Family: Not on file  . Attends Religious Services: Not on file  . Active Member of Clubs or Organizations: Not on file  . Attends Archivist Meetings: Not on file  . Marital Status: Not on file  Intimate Partner Violence: Unknown  . Fear of Current or Ex-Partner: Patient refused  . Emotionally Abused: Patient refused  .  Physically Abused: Patient refused  . Sexually Abused: Patient refused    Family History  Problem Relation Age of Onset  . Hypertension Other   . Diabetes Other   . Clotting disorder Father     Allergies  Allergen Reactions  . Codeine Nausea Only    Patient questioned this (??)  . Sulfa Antibiotics Hives and Nausea And Vomiting     Review of Systems   Review of Systems: Negative Unless Checked Constitutional: [] Weight loss  [] Fever  [] Chills Cardiac: [] Chest pain   []  Atrial Fibrillation  [] Palpitations   [] Shortness of breath when laying flat   [] Shortness of breath with exertion. [] Shortness of breath at rest Vascular:  [] Pain in legs with walking   [] Pain in legs with standing [] Pain in legs when laying flat   [] Claudication    [] Pain in feet  when laying flat    [x] History of DVT   [] Phlebitis   [] Swelling in legs   [] Varicose veins   [] Non-healing ulcers Pulmonary:   [] Uses home oxygen   [] Productive cough   [] Hemoptysis   [] Wheeze  [] COPD   [] Asthma Neurologic:  [] Dizziness   [] Seizures  [] Blackouts [] History of stroke   [] History of TIA  [] Aphasia   [] Temporary Blindness   [] Weakness or numbness in arm   [x] Weakness or numbness in leg Musculoskeletal:   [] Joint swelling   [] Joint pain   [] Low back pain  []  History of Knee Replacement [] Arthritis [] back Surgeries  []  Spinal Stenosis    Hematologic:  [] Easy bruising  [] Easy bleeding   [] Hypercoagulable state   [x] Anemic Gastrointestinal:  [] Diarrhea   [] Vomiting  [x] Gastroesophageal reflux/heartburn   [] Difficulty swallowing. [] Abdominal pain Genitourinary:  [x] Chronic kidney disease   [] Difficult urination  [] Anuric   [] Blood in urine [] Frequent urination  [] Burning with urination   [] Hematuria Skin:  [] Rashes   [] Ulcers [] Wounds Psychological:  [] History of anxiety   []  History of major depression  []  Memory Difficulties      OBJECTIVE:   Physical Exam  BP 109/80 (BP Location: Left Arm)   Pulse 88   Resp 16   Gen:  WD/WN, NAD Head: [place]/AT, No temporalis wasting.  Ear/Nose/Throat: Hearing grossly intact, nares w/o erythema or drainage Eyes: PER, EOMI, sclera nonicteric.  Neck: Supple, no masses.  No JVD.  Pulmonary:  Good air movement, no use of accessory muscles.  Cardiac: RRR Vascular:  Vessel Right Left  Radial Palpable Palpable   Gastrointestinal: soft, non-distended. No guarding/no peritoneal signs.  Musculoskeletal: M/S 5/5 throughout.  No deformity or atrophy.  Neurologic: Pain and light touch intact in extremities.  Symmetrical.  Speech is fluent. Motor exam as listed above. Psychiatric: Judgment intact, Mood & affect appropriate for pt's clinical situation. Dermatologic: No Venous rashes. No Ulcers Noted.  No changes consistent with cellulitis. Lymph : No Cervical lymphadenopathy, no lichenification or skin changes of chronic lymphedema.       ASSESSMENT AND PLAN:  1. Arm DVT (deep venous thromboembolism), acute, right (HCC) We will authorize a one-time refill of pain medication for the patient.  Otherwise the patient has good color and pulse in his right upper extremity.  Patient does still have some persistent swelling however with continued elevation this should go down in time.  We will have the patient return in 6 weeks with noninvasive studies due to his previous history of early reocclusion.  2. Tobacco abuse Smoking cessation was discussed, 3-10 minutes spent on this topic specifically    Current Outpatient Medications on File Prior to Visit  Medication Sig Dispense Refill  . amLODipine (NORVASC) 5 MG tablet Take 1 tablet (5 mg total) by mouth daily. 30 tablet 3  . apixaban (ELIQUIS) 5 MG TABS tablet Take 1 tablet (5 mg total) by mouth 2 (two) times daily. 60 tablet 0  . b complex-C-folic acid 1 MG capsule Take 1 capsule by mouth daily after supper.     . carvedilol (COREG) 25 MG tablet Take 1 tablet (25 mg total) by mouth 2 (two) times daily with a meal. 60 tablet 1  .  apixaban (ELIQUIS) 5 MG TABS tablet Take 2 tablets (10 mg total) by mouth 2 (two) times daily for 6 days. 24 tablet 0  . atorvastatin (LIPITOR) 80 MG tablet Take 1 tablet (80 mg total) by mouth at bedtime. (Patient not taking: Reported on 10/28/2019) 30 tablet  1  . gabapentin (NEURONTIN) 100 MG capsule Take 1 capsule (100 mg total) by mouth 3 (three) times daily. (Patient not taking: Reported on 10/28/2019) 90 capsule 1  . hydrALAZINE (APRESOLINE) 25 MG tablet Take 1 tablet (25 mg total) by mouth every 8 (eight) hours. (Patient not taking: Reported on 10/28/2019) 90 tablet 1  . hydrOXYzine (ATARAX/VISTARIL) 25 MG tablet Take 25 mg by mouth 3 (three) times daily as needed for anxiety.    . levETIRAcetam (KEPPRA) 500 MG tablet Take 1 tablet (500 mg total) by mouth 2 (two) times daily. (Patient not taking: Reported on 10/28/2019) 60 tablet 0  . loratadine (CLARITIN) 10 MG tablet Take 1 tablet (10 mg total) by mouth daily. (Patient not taking: Reported on 10/28/2019) 30 tablet 0  . multivitamin (RENA-VIT) TABS tablet Take 1 tablet by mouth daily. (Patient not taking: Reported on 10/28/2019) 30 each 1  . neomycin-bacitracin-polymyxin (NEOSPORIN) OINT Apply 1 application topically 2 (two) times daily. (Patient not taking: Reported on 10/28/2019) 56 g 1  . Nutritional Supplements (FEEDING SUPPLEMENT, NEPRO CARB STEADY,) LIQD Take 237 mLs by mouth 2 (two) times daily between meals. (Patient not taking: Reported on 10/28/2019) 237 mL 30  . sevelamer carbonate (RENVELA) 800 MG tablet Take 2 tablets (1,600 mg total) by mouth 3 (three) times daily with meals. (Patient not taking: Reported on 10/28/2019) 180 tablet 1   No current facility-administered medications on file prior to visit.    There are no Patient Instructions on file for this visit. No follow-ups on file.   Kris Hartmann, NP  This note was completed with Sales executive.  Any errors are purely unintentional.

## 2019-11-20 ENCOUNTER — Other Ambulatory Visit: Payer: Self-pay | Admitting: Student in an Organized Health Care Education/Training Program

## 2019-12-15 ENCOUNTER — Other Ambulatory Visit: Payer: Self-pay

## 2019-12-15 ENCOUNTER — Ambulatory Visit
Admission: EM | Disposition: A | Discharge status: Home / Self Care | Payer: Self-pay | Source: Home / Self Care | Attending: Emergency Medicine

## 2019-12-15 ENCOUNTER — Observation Stay
Admission: EM | Admit: 2019-12-15 | Discharge: 2019-12-16 | Disposition: A | Discharge status: Home / Self Care | Payer: Medicare Other | Attending: Internal Medicine | Admitting: Internal Medicine

## 2019-12-15 ENCOUNTER — Other Ambulatory Visit (INDEPENDENT_AMBULATORY_CARE_PROVIDER_SITE_OTHER): Payer: Self-pay | Admitting: Vascular Surgery

## 2019-12-15 ENCOUNTER — Encounter: Payer: Self-pay | Admitting: Emergency Medicine

## 2019-12-15 ENCOUNTER — Emergency Department: Payer: Medicare Other

## 2019-12-15 DIAGNOSIS — L8990 Pressure ulcer of unspecified site, unspecified stage: Secondary | ICD-10-CM | POA: Insufficient documentation

## 2019-12-15 DIAGNOSIS — Z8674 Personal history of sudden cardiac arrest: Secondary | ICD-10-CM | POA: Diagnosis not present

## 2019-12-15 DIAGNOSIS — Z79899 Other long term (current) drug therapy: Secondary | ICD-10-CM | POA: Insufficient documentation

## 2019-12-15 DIAGNOSIS — Z86718 Personal history of other venous thrombosis and embolism: Secondary | ICD-10-CM | POA: Diagnosis not present

## 2019-12-15 DIAGNOSIS — I5022 Chronic systolic (congestive) heart failure: Secondary | ICD-10-CM | POA: Diagnosis present

## 2019-12-15 DIAGNOSIS — Z882 Allergy status to sulfonamides status: Secondary | ICD-10-CM | POA: Insufficient documentation

## 2019-12-15 DIAGNOSIS — Z20822 Contact with and (suspected) exposure to covid-19: Secondary | ICD-10-CM | POA: Diagnosis not present

## 2019-12-15 DIAGNOSIS — F32A Depression, unspecified: Secondary | ICD-10-CM | POA: Diagnosis present

## 2019-12-15 DIAGNOSIS — Z91158 Patient's noncompliance with renal dialysis for other reason: Secondary | ICD-10-CM

## 2019-12-15 DIAGNOSIS — D631 Anemia in chronic kidney disease: Secondary | ICD-10-CM | POA: Diagnosis not present

## 2019-12-15 DIAGNOSIS — Z885 Allergy status to narcotic agent status: Secondary | ICD-10-CM | POA: Insufficient documentation

## 2019-12-15 DIAGNOSIS — T8241XA Breakdown (mechanical) of vascular dialysis catheter, initial encounter: Principal | ICD-10-CM | POA: Insufficient documentation

## 2019-12-15 DIAGNOSIS — I132 Hypertensive heart and chronic kidney disease with heart failure and with stage 5 chronic kidney disease, or end stage renal disease: Secondary | ICD-10-CM | POA: Diagnosis not present

## 2019-12-15 DIAGNOSIS — N2581 Secondary hyperparathyroidism of renal origin: Secondary | ICD-10-CM | POA: Diagnosis not present

## 2019-12-15 DIAGNOSIS — I252 Old myocardial infarction: Secondary | ICD-10-CM

## 2019-12-15 DIAGNOSIS — T829XXA Unspecified complication of cardiac and vascular prosthetic device, implant and graft, initial encounter: Secondary | ICD-10-CM | POA: Diagnosis present

## 2019-12-15 DIAGNOSIS — N186 End stage renal disease: Secondary | ICD-10-CM

## 2019-12-15 DIAGNOSIS — Y841 Kidney dialysis as the cause of abnormal reaction of the patient, or of later complication, without mention of misadventure at the time of the procedure: Secondary | ICD-10-CM | POA: Diagnosis not present

## 2019-12-15 DIAGNOSIS — Z7901 Long term (current) use of anticoagulants: Secondary | ICD-10-CM | POA: Diagnosis not present

## 2019-12-15 DIAGNOSIS — I10 Essential (primary) hypertension: Secondary | ICD-10-CM | POA: Diagnosis present

## 2019-12-15 DIAGNOSIS — E875 Hyperkalemia: Secondary | ICD-10-CM

## 2019-12-15 DIAGNOSIS — Z992 Dependence on renal dialysis: Secondary | ICD-10-CM | POA: Insufficient documentation

## 2019-12-15 DIAGNOSIS — Z9115 Patient's noncompliance with renal dialysis: Secondary | ICD-10-CM | POA: Insufficient documentation

## 2019-12-15 DIAGNOSIS — N185 Chronic kidney disease, stage 5: Secondary | ICD-10-CM

## 2019-12-15 DIAGNOSIS — F329 Major depressive disorder, single episode, unspecified: Secondary | ICD-10-CM | POA: Insufficient documentation

## 2019-12-15 DIAGNOSIS — I739 Peripheral vascular disease, unspecified: Secondary | ICD-10-CM | POA: Insufficient documentation

## 2019-12-15 HISTORY — PX: TEMPORARY DIALYSIS CATHETER: CATH118312

## 2019-12-15 LAB — CBC
HCT: 40.3 % (ref 39.0–52.0)
HCT: 39.8 % (ref 39.0–52.0)
Hemoglobin: 12.9 g/dL — ABNORMAL LOW (ref 13.0–17.0)
Hemoglobin: 13.1 g/dL (ref 13.0–17.0)
MCH: 30.9 pg (ref 26.0–34.0)
MCH: 30.7 pg (ref 26.0–34.0)
MCHC: 32 g/dL (ref 30.0–36.0)
MCHC: 32.9 g/dL (ref 30.0–36.0)
MCV: 96.4 fL (ref 80.0–100.0)
MCV: 93.2 fL (ref 80.0–100.0)
Platelets: 113 10*3/uL — ABNORMAL LOW (ref 150–400)
Platelets: 70 10*3/uL — ABNORMAL LOW (ref 150–400)
RBC: 4.18 MIL/uL — ABNORMAL LOW (ref 4.22–5.81)
RBC: 4.27 MIL/uL (ref 4.22–5.81)
RDW: 20.1 % — ABNORMAL HIGH (ref 11.5–15.5)
RDW: 20.2 % — ABNORMAL HIGH (ref 11.5–15.5)
WBC: 7.6 10*3/uL (ref 4.0–10.5)
WBC: 7.3 10*3/uL (ref 4.0–10.5)
nRBC: 0 % (ref 0.0–0.2)
nRBC: 0.3 % — ABNORMAL HIGH (ref 0.0–0.2)

## 2019-12-15 LAB — COMPREHENSIVE METABOLIC PANEL
ALT: 17 U/L (ref 0–44)
AST: 26 U/L (ref 15–41)
Albumin: 3 g/dL — ABNORMAL LOW (ref 3.5–5.0)
Alkaline Phosphatase: 92 U/L (ref 38–126)
Anion gap: 28 — ABNORMAL HIGH (ref 5–15)
BUN: 122 mg/dL — ABNORMAL HIGH (ref 6–20)
CO2: 18 mmol/L — ABNORMAL LOW (ref 22–32)
Calcium: 8.7 mg/dL — ABNORMAL LOW (ref 8.9–10.3)
Chloride: 86 mmol/L — ABNORMAL LOW (ref 98–111)
Creatinine, Ser: 10.39 mg/dL — ABNORMAL HIGH (ref 0.61–1.24)
GFR calc Af Amer: 6 mL/min — ABNORMAL LOW (ref >60)
GFR calc non Af Amer: 5 mL/min — ABNORMAL LOW (ref >60)
Glucose, Bld: 144 mg/dL — ABNORMAL HIGH (ref 70–99)
Potassium: 6.4 mmol/L (ref 3.5–5.1)
Sodium: 132 mmol/L — ABNORMAL LOW (ref 135–145)
Total Bilirubin: 3.2 mg/dL — ABNORMAL HIGH (ref 0.3–1.2)
Total Protein: 6.6 g/dL (ref 6.5–8.1)

## 2019-12-15 LAB — GLUCOSE, CAPILLARY: Glucose-Capillary: 120 mg/dL — ABNORMAL HIGH (ref 70–99)

## 2019-12-15 LAB — PROTIME-INR
INR: 1.6 — ABNORMAL HIGH (ref 0.8–1.2)
Prothrombin Time: 18.5 seconds — ABNORMAL HIGH (ref 11.4–15.2)

## 2019-12-15 LAB — RESPIRATORY PANEL BY RT PCR (FLU A&B, COVID)
Influenza A by PCR: NEGATIVE
Influenza B by PCR: NEGATIVE
SARS Coronavirus 2 by RT PCR: NEGATIVE

## 2019-12-15 LAB — LIPASE, BLOOD: Lipase: 28 U/L (ref 11–51)

## 2019-12-15 SURGERY — TEMPORARY DIALYSIS CATHETER
Anesthesia: Moderate Sedation

## 2019-12-15 MED ORDER — ACETAMINOPHEN 650 MG RE SUPP: 650.0000 mg | Freq: Four times a day (QID) | RECTAL | Status: DC | PRN

## 2019-12-15 MED ORDER — DEXTROSE 50 % IV SOLN
1.0000 | Freq: Once | INTRAVENOUS | Status: DC
  Filled 2019-12-15: qty 50

## 2019-12-15 MED ORDER — HEPARIN SODIUM (PORCINE) 5000 UNIT/ML IJ SOLN: 5000.0000 [IU] | Freq: Three times a day (TID) | INTRAMUSCULAR | Status: DC

## 2019-12-15 MED ORDER — PROMETHAZINE HCL 25 MG/ML IJ SOLN
12.5000 mg | Freq: Four times a day (QID) | INTRAMUSCULAR | Status: DC | PRN
  Administered 2019-12-15: 12.5 mg via INTRAVENOUS
  Filled 2019-12-15: qty 1

## 2019-12-15 MED ORDER — ACETAMINOPHEN 325 MG PO TABS: 650.0000 mg | ORAL_TABLET | Freq: Four times a day (QID) | ORAL | Status: DC | PRN

## 2019-12-15 MED ORDER — ONDANSETRON HCL 4 MG/2ML IJ SOLN: 4.0000 mg | Freq: Four times a day (QID) | INTRAMUSCULAR | Status: DC | PRN

## 2019-12-15 MED ORDER — CHLORHEXIDINE GLUCONATE CLOTH 2 % EX PADS
6.0000 | MEDICATED_PAD | Freq: Every day | CUTANEOUS | Status: DC
  Administered 2019-12-16: 6 via TOPICAL
  Filled 2019-12-15: qty 6

## 2019-12-15 MED ORDER — SENNOSIDES-DOCUSATE SODIUM 8.6-50 MG PO TABS: 1.0000 | ORAL_TABLET | Freq: Every evening | ORAL | Status: DC | PRN

## 2019-12-15 MED ORDER — INSULIN ASPART 100 UNIT/ML IV SOLN
10.0000 [IU] | Freq: Once | INTRAVENOUS | Status: DC
  Filled 2019-12-15: qty 0.1

## 2019-12-15 MED ORDER — ONDANSETRON HCL 4 MG PO TABS: 4.0000 mg | ORAL_TABLET | Freq: Four times a day (QID) | ORAL | Status: DC | PRN

## 2019-12-15 MED ORDER — HEPARIN SODIUM (PORCINE) 5000 UNIT/ML IJ SOLN
5000.0000 [IU] | Freq: Two times a day (BID) | INTRAMUSCULAR | Status: DC
  Filled 2019-12-15 (×2): qty 1

## 2019-12-15 MED ORDER — SODIUM ZIRCONIUM CYCLOSILICATE 10 G PO PACK
10.0000 g | PACK | Freq: Once | ORAL | Status: DC
  Filled 2019-12-15: qty 1

## 2019-12-15 MED ORDER — ONDANSETRON HCL 4 MG/2ML IJ SOLN
INTRAMUSCULAR | Status: AC
  Administered 2019-12-15: 4 mg
  Filled 2019-12-15: qty 2

## 2019-12-15 SURGICAL SUPPLY — 1 items: KIT DIALYSIS CATH TRI 30X13 (CATHETERS) ×2 IMPLANT

## 2019-12-15 NOTE — ED Provider Notes (Signed)
-----------------------------------------   8:32 PM on 12/15/2019 -----------------------------------------  Patient has returned from dialysis and temp catheter placement.  I spoke to nephrology, patient will require admission to the hospitalist service for permanent access placement.  Patient agreeable plan of care.  Patient appears well is requesting food.   Harvest Dark, MD 12/15/19 2033

## 2019-12-15 NOTE — Progress Notes (Signed)
Pre HD Tx Note Pt arrived from specials afetr right femoral temp CVC placement. Pt is A*3 missed the date. Ot reports no chest pain or SOB. Afebrile, on RA SPO2 95-96%. BP WDL. CVC WDL to start Tx  12/15/19 1600  Hand-Off documentation  Report given to (Full Name) Newt Minion RN   Report received from (Full Name) Juliann Pulse RN   Vital Signs  Temp 98.7 F (37.1 C)  Temp Source Oral  Pulse Rate 89  Pulse Rate Source Monitor  Resp 16  BP (!) 124/95  BP Location Right Arm  BP Method Automatic  Patient Position (if appropriate) Lying  Oxygen Therapy  SpO2 95 %  O2 Device Room Air  Pain Assessment  Pain Scale 0-10  Pain Score 0  Dialysis Weight  Weight 45.4 kg  Type of Weight Pre-Dialysis  Time-Out for Hemodialysis  What Procedure? HD  Pt Identifiers(min of two) First/Last Name;MRN/Account#  Correct Site? Yes  Correct Side? Yes  Correct Procedure? Yes  Consents Verified? Yes  Safety Precautions Reviewed? Yes  Engineer, civil (consulting) Number 5  Station Number 1  UF/Alarm Test Passed  Conductivity: Meter 13.8  Conductivity: Machine  13.7  pH 7.4  Reverse Osmosis Main  Normal Saline Lot Number [id number]  Dialyzer Lot Number [id number]  Disposable Set Lot Number [id number]  Machine Temperature (!) 95.9 F (35.5 C)  Musician and Audible Yes  Blood Lines Intact and Secured Yes  Pre Treatment Patient Checks  Vascular access used during treatment Catheter  HD catheter dressing before treatment WDL  Patient is receiving dialysis in a chair  (In Bed)  Hepatitis B Surface Antigen Results Negative  Date Hepatitis B Surface Antigen Drawn 12/01/19  Hepatitis B Surface Antibody  (<10)  Date Hepatitis B Surface Antibody Drawn 12/01/19  Hemodialysis Consent Verified Yes  Hemodialysis Standing Orders Initiated Yes  ECG (Telemetry) Monitor On Yes  Prime Ordered Normal Saline  Length of  DialysisTreatment -hour(s) 3.5 Hour(s)  Dialysis Treatment Comments  (Na140)  Dialyzer Elisio  17H NR  Dialysate 2K;2.5 Ca  Dialysis Anticoagulant None  Dialysate Flow Ordered 600  Blood Flow Rate Ordered 400 mL/min  Ultrafiltration Goal 3 Liters  Dialysis Blood Pressure Support Ordered Albumin  Hemodialysis Catheter Right Femoral vein Triple lumen Temporary (Non-Tunneled)  Placement Date/Time: 12/15/19 1544   Time Out: Correct patient;Correct site;Correct procedure  Maximum sterile barrier precautions: Hand hygiene;Cap;Mask;Sterile gown;Sterile gloves;Large sterile sheet  Site Prep: Chlorhexidine (preferred)  Local Anes...  Site Condition No complications  Blue Lumen Status Infusing;Flushed;Blood return noted  Red Lumen Status Infusing;Flushed;Blood return noted  Purple Lumen Status Capped (Central line)  Catheter fill solution Heparin 1000 units/ml  Catheter fill volume (Arterial) 1.8 cc  Catheter fill volume (Venous) 1.8  Dressing Type Gauze/Drain sponge  Dressing Status Clean;Dry;Intact  Drainage Description None  Dressing Change Due 12/16/19  Hemodialysis Catheter Right Internal jugular Double lumen Permanent (Tunneled)  No Placement Date or Time found.   Placed prior to admission: Yes  Orientation: Right  Access Location: Internal jugular  Hemodialysis Catheter Type: Double lumen Permanent (Tunneled)  Site Condition Other (Comment) (Exposed)  Blue Lumen Status Occluded  Red Lumen Status Occluded  Purple Lumen Status N/A  Catheter fill solution Other (Comment) (UNable to know, Pt coming from ED)  Catheter fill volume (Arterial) 2 cc  Catheter fill volume (Venous) 2.2  Dressing Type Other (Comment) (None)

## 2019-12-15 NOTE — ED Triage Notes (Signed)
Pt in via [facility] from Dialysis; per patient, his Dialysis center refused treatment today due to too many missed appointments.  Pt reports last dialysis 2/13 due to transportation issues.  Complaints of N/V/D x 3-4 weeks.  NAD noted at this time.

## 2019-12-15 NOTE — Progress Notes (Signed)
HD Tx Started W/out complications   [id number]  Vital Signs  Pulse Rate 91  BP (!) 124/95  Oxygen Therapy  SpO2 100 %  O2 Device Room Air  Pain Assessment  Pain Scale 0-10  Pain Score 0  During Hemodialysis Assessment  Blood Flow Rate (mL/min) 300 mL/min  Arterial Pressure (mmHg) -120 mmHg  Venous Pressure (mmHg) 170 mmHg  Transmembrane Pressure (mmHg) 70 mmHg  Ultrafiltration Rate (mL/min) 860 mL/min  Dialysate Flow Rate (mL/min) 600 ml/min  Conductivity: Machine  13.9  HD Safety Checks Performed Yes  Dialysis Fluid Bolus Normal Saline  Bolus Amount (mL) 250 mL  Intra-Hemodialysis Comments Tx initiated

## 2019-12-15 NOTE — ED Notes (Signed)
Pt to go to Dialysis STAT. Spoke with RN from Dialysis. She stated she will call when she is ready for pt.   Pt to go to [facility]

## 2019-12-15 NOTE — Op Note (Signed)
  OPERATIVE NOTE   PROCEDURE: 1. Insertion of temporary dialysis catheter catheter right femoral approach.  PRE-OPERATIVE DIAGNOSIS: Complication of dialysis device with nonfunction of the tunneled catheter and exposure of the cuff  POST-OPERATIVE DIAGNOSIS: Same  SURGEON: Katha Cabal M.D.  ANESTHESIA: 1% lidocaine local infiltration  ESTIMATED BLOOD LOSS: Minimal cc  INDICATIONS:   Brett Small is a 44 y.o. male who presents with tunnel catheter that is not working.  Furthermore the cuff is now partially exposed.  His potassium is greater than 6 and therefore he is undergoing placement of a temporary catheter so dialysis can be performed in his hyper kalemia resolved.  We will then plan for revision of his tunneled catheter.  DESCRIPTION: After obtaining full informed written consent, the patient was positioned supine. The right groin was prepped and draped in a sterile fashion. Ultrasound was placed in a sterile sleeve. Ultrasound was utilized to identify the right common femoral vein which is noted to be echolucent and compressible indicating patency. Images recorded for the permanent record. Under real-time visualization a Seldinger needle is inserted into the vein and the guidewires advanced without difficulty. Small counterincision was made at the wire insertion site. Dilator is passed over the wire and the temporary dialysis catheter catheter is fed over the wire without difficulty.  All lumens aspirate and flush easily and are packed with heparin saline. Catheter secured to the skin of the right thigh with 2-0 silk. A sterile dressing is applied with Biopatch.  COMPLICATIONS: None  CONDITION: Unchanged  [name] Office:  [phone] 12/15/2019, 4:59 PM

## 2019-12-15 NOTE — ED Provider Notes (Signed)
[facility] Emergency Department Provider Note   ____________________________________________   First MD Initiated Contact with Patient 12/15/19 1231     (approximate)  I have reviewed the triage vital signs and the nursing notes.   HISTORY  Chief Complaint Dialysis    HPI Brett Small is a 44 y.o. male with past medical history of ESRD on HD, PAD, hypertension who presents to the ED for dialysis.  Patient reports that he has missed at least 1 week of dialysis appointments, was last dialyzed on February 13.  He states that he has been unable to get to his dialysis appointments due to weather issues and inability to get a ride.  He has been dealing with some nausea and diarrhea recently, complains of a swollen hemorrhoid.  He has otherwise been feeling well and when he attempted to go to his dialysis appointment today, was referred to the ED for evaluation.        Past Medical History:  Diagnosis Date  . ESRD (end stage renal disease) ([place])   . Hypertension   . PAD (peripheral artery disease) (HCC)    Required aortofemoral stent-which had closed and had to redo the procedure and ischemia of limb.  . Peripheral vascular disease ([place])   . Renal disorder   . Secondary hyperparathyroidism of renal origin [facility])     Patient Active Problem List   Diagnosis Date Noted  . Sepsis due to pneumonia ([place]) 10/27/2019  . Generalized weakness 10/27/2019  . Cardiac arrest ([place]) 10/27/2019  . Nausea & vomiting 10/04/2019  . Arm DVT (deep venous thromboembolism), acute, right ([place]) 10/04/2019  . Hypotension 10/04/2019  . Bronchitis 10/03/2019  . Tobacco abuse 08/24/2019  . Marijuana use 08/24/2019  . PVD (peripheral vascular disease) ([place]) 08/24/2019  . Wound dehiscence, surgical, sequela 08/24/2019  . Chronic systolic CHF (congestive heart failure) ([place]) 08/24/2019  . Volume overload 08/24/2019  . Hyperkalemia 08/24/2019  . Complications due to renal  dialysis device, implant, and graft 08/24/2019  . Seizure ([place])   . Malnutrition of moderate degree 08/05/2019  . Acute CHF (congestive heart failure) ([place]) 08/04/2019  . Pulmonary edema 07/27/2019  . Pleural effusion 07/20/2019  . NSTEMI (non-ST elevated myocardial infarction) ([place]) 07/09/2019  . Pneumonia 07/02/2019  . Acute liver failure 05/19/2019  . Hematemesis 05/19/2019  . Hepatitis   . Thrombocytopenia ([place])   . Coagulopathy ([place])   . Sepsis ([place]) 02/28/2019  . Lobar pneumonia ([place]) 02/28/2019  . Acute respiratory failure ([place]) 02/04/2019  . Acute respiratory failure with hypoxemia ([place]) 01/27/2019  . Depression 01/07/2019  . MDD (major depressive disorder), single episode, severe , no psychosis ([place])   . Homelessness   . Acute respiratory failure with hypoxia ([place]) 12/25/2018  . End stage renal disease on dialysis ([place]) 12/25/2018  . Hypertension 12/25/2018  . Renal osteodystrophy 12/25/2018  . Anemia 10/01/2018  . GERD (gastroesophageal reflux disease) 10/01/2018  . Malnutrition ([place]) 09/30/2018  . ESRD on hemodialysis ([place]) 09/15/2018  . HFrEF (heart failure with reduced ejection fraction) ([place]) 09/15/2018  . HLD (hyperlipidemia) 09/15/2018  . Secondary hyperparathyroidism ([place]) 08/18/2018  . Hx of fasciotomy 07/09/2018    Past Surgical History:  Procedure Laterality Date  . A/V SHUNT INTERVENTION Left 01/19/2019   Procedure: LEFT UPPER EXTREMITY A/V SHUNTOGRAM / UPPER EXTREMITY ANGIOGRAM;  Surgeon: Algernon Huxley, MD;  Location: [place] CV LAB;  Service: Cardiovascular;  Laterality: Left;  . A/V SHUNT INTERVENTION Left 03/02/2019   Procedure: A/V SHUNT INTERVENTION;  Surgeon: Algernon Huxley, MD;  Location: [place] CV LAB;  Service: Cardiovascular;  Laterality: Left;  . AORTA - FEMORAL ARTERY BYPASS GRAFT    . AV FISTULA PLACEMENT Left 12/26/2018   Procedure: INSERTION OF GORE STRETCH VASCULAR 4-7MM X  45CM IN LEFT UPPER ARM;  Surgeon: Marty Heck,  MD;  Location: [place];  Service: Vascular;  Laterality: Left;  . AV FISTULA PLACEMENT Right 08/12/2019   Procedure: INSERTION OF ARTERIOVENOUS (AV) GORE-TEX GRAFT ARM;  Surgeon: Algernon Huxley, MD;  Location: [facility] ORS;  Service: Vascular;  Laterality: Right;  . DIALYSIS/PERMA CATHETER REMOVAL N/A 02/06/2019   Procedure: DIALYSIS/PERMA CATHETER REMOVAL;  Surgeon: Algernon Huxley, MD;  Location: [place] CV LAB;  Service: Cardiovascular;  Laterality: N/A;  . ESOPHAGOGASTRODUODENOSCOPY N/A 10/07/2019   Procedure: ESOPHAGOGASTRODUODENOSCOPY (EGD);  Surgeon: Lin Landsman, MD;  Location: [facility] END[facility]COPY;  Service: Gastroenterology;  Laterality: N/A;  . LEFT HEART CATH AND CORONARY ANGIOGRAPHY Right 07/10/2019   Procedure: LEFT HEART CATH AND CORONARY ANGIOGRAPHY;  Surgeon: Dionisio David, MD;  Location: [place] CV LAB;  Service: Cardiovascular;  Laterality: Right;  . PERIPHERAL VASCULAR THROMBECTOMY Left 07/28/2019   Procedure: Left Upper Extremity Dialysis Access Declot;  Surgeon: Katha Cabal, MD;  Location: [place] CV LAB;  Service: Cardiovascular;  Laterality: Left;  . REMOVAL OF A DIALYSIS CATHETER Right 08/26/2019   Procedure: REMOVAL OF A DIALYSIS CATHETER;  Surgeon: Algernon Huxley, MD;  Location: [facility] ORS;  Service: Vascular;  Laterality: Right;  . TEMPORARY DIALYSIS CATHETER N/A 08/25/2019   Procedure: TEMPORARY DIALYSIS CATHETER;  Surgeon: Katha Cabal, MD;  Location: [place] CV LAB;  Service: Cardiovascular;  Laterality: N/A;  . UPPER EXTREMITY ANGIOGRAPHY Left 08/13/2019   Procedure: UPPER EXTREMITY ANGIOGRAPHy;  Surgeon: Algernon Huxley, MD;  Location: [place] CV LAB;  Service: Cardiovascular;  Laterality: Left;  . UPPER EXTREMITY INTERVENTION Right 11/02/2019   Procedure: UPPER EXTREMITY INTERVENTION;  Surgeon: Algernon Huxley, MD;  Location: [place] CV LAB;  Service: Cardiovascular;  Laterality: Right;    Prior to Admission medications   Medication Sig  Start Date End Date Taking? Authorizing Provider  amLODipine (NORVASC) 5 MG tablet Take 1 tablet (5 mg total) by mouth daily. 08/28/19   Danford, Suann Larry, MD  apixaban (ELIQUIS) 5 MG TABS tablet Take 2 tablets (10 mg total) by mouth 2 (two) times daily for 6 days. 11/04/19 11/10/19  Wyvonnia Dusky, MD  apixaban (ELIQUIS) 5 MG TABS tablet Take 1 tablet (5 mg total) by mouth 2 (two) times daily. 11/10/19 12/10/19  Wyvonnia Dusky, MD  atorvastatin (LIPITOR) 80 MG tablet Take 1 tablet (80 mg total) by mouth at bedtime. Patient not taking: Reported on 10/28/2019 01/26/19   Clapacs, Madie Reno, MD  b complex-C-folic acid 1 MG capsule Take 1 capsule by mouth daily after supper.     [provider]  carvedilol (COREG) 25 MG tablet Take 1 tablet (25 mg total) by mouth 2 (two) times daily with a meal. 01/26/19   Clapacs, Madie Reno, MD  gabapentin (NEURONTIN) 100 MG capsule Take 1 capsule (100 mg total) by mouth 3 (three) times daily. Patient not taking: Reported on 10/28/2019 01/26/19   Clapacs, Madie Reno, MD  hydrALAZINE (APRESOLINE) 25 MG tablet Take 1 tablet (25 mg total) by mouth every 8 (eight) hours. Patient not taking: Reported on 10/28/2019 01/26/19   Clapacs, Madie Reno, MD  HYDROcodone-acetaminophen (NORCO/VICODIN) 5-325 MG tablet Take 1 tablet  by mouth every 6 (six) hours as needed for moderate pain. 11/13/19   Kris Hartmann, NP  hydrOXYzine (ATARAX/VISTARIL) 25 MG tablet Take 25 mg by mouth 3 (three) times daily as needed for anxiety.    [provider]  levETIRAcetam (KEPPRA) 500 MG tablet Take 1 tablet (500 mg total) by mouth 2 (two) times daily. Patient not taking: Reported on 10/28/2019 08/17/19   MayoPete Pelt, MD  loratadine (CLARITIN) 10 MG tablet Take 1 tablet (10 mg total) by mouth daily. Patient not taking: Reported on 10/28/2019 03/04/19   Salary, Holly Bodily D, MD  multivitamin (RENA-VIT) TABS tablet Take 1 tablet by mouth daily. Patient not taking: Reported on 10/28/2019 01/27/19   Clapacs,  Madie Reno, MD  neomycin-bacitracin-polymyxin (NEOSPORIN) OINT Apply 1 application topically 2 (two) times daily. Patient not taking: Reported on 10/28/2019 01/26/19   Clapacs, Madie Reno, MD  Nutritional Supplements (FEEDING SUPPLEMENT, NEPRO CARB STEADY,) LIQD Take 237 mLs by mouth 2 (two) times daily between meals. Patient not taking: Reported on 10/28/2019 07/06/19   Epifanio Lesches, MD  sevelamer carbonate (RENVELA) 800 MG tablet Take 2 tablets (1,600 mg total) by mouth 3 (three) times daily with meals. Patient not taking: Reported on 10/28/2019 01/26/19   Clapacs, Madie Reno, MD    Allergies Sulfa antibiotics and Codeine  Family History  Problem Relation Age of Onset  . Hypertension Other   . Diabetes Other   . Clotting disorder Father     Social History Social History   Tobacco Use  . Smoking status: Current Every Day Smoker    Packs/day: 0.50    Years: 30.00    Pack years: 15.00    Types: Cigarettes    Last attempt to quit: 06/26/2018    Years since quitting: 1.4  . Smokeless tobacco: Never Used  . Tobacco comment: smoked for 30 years   Substance Use Topics  . Alcohol use: Not Currently  . Drug use: Yes    Frequency: 1.0 times per week    Types: Marijuana    Review of Systems  Constitutional: No fever/chills Eyes: No visual changes. ENT: No sore throat. Cardiovascular: Denies chest pain. Respiratory: Denies shortness of breath. Gastrointestinal: No abdominal pain.  Positive for nausea, no vomiting.  Positive for diarrhea.  No constipation.  Positive for rectal pain. Genitourinary: Negative for dysuria. Musculoskeletal: Negative for back pain. Skin: Negative for rash. Neurological: Negative for headaches, focal weakness or numbness.  ____________________________________________   PHYSICAL EXAM:  VITAL SIGNS: ED Triage Vitals  Enc Vitals Group     BP 12/15/19 1058 (!) 124/96     Pulse Rate 12/15/19 1058 94     Resp 12/15/19 1058 18     Temp 12/15/19 1058 97.7 F (36.5  C)     Temp Source 12/15/19 1058 Oral     SpO2 12/15/19 1058 99 %     Weight 12/15/19 1058 100 lb (45.4 kg)     Height 12/15/19 1058 5\' 1"  (1.549 m)     Head Circumference --      Peak Flow --      Pain Score 12/15/19 1119 8     Pain Loc --      Pain Edu? --      Excl. in [place]? --     Constitutional: Alert and oriented. Eyes: Conjunctivae are normal. Head: Atraumatic. Nose: No congestion/rhinnorhea. Mouth/Throat: Mucous membranes are moist. Neck: Normal ROM Cardiovascular: Normal rate, regular rhythm. Grossly normal heart sounds.  Right IJ TDC  in place with sutures detached and line partially pulled out. Respiratory: Normal respiratory effort.  No retractions. Lungs CTAB. Gastrointestinal: Soft and nontender. No distention.  Hemorrhoid noted on rectal exam with no active bleeding. Genitourinary: deferred Musculoskeletal: No lower extremity tenderness nor edema. Neurologic:  Normal speech and language. No gross focal neurologic deficits are appreciated. Skin:  Skin is warm, dry and intact. No rash noted. Psychiatric: Mood and affect are normal. Speech and behavior are normal.  ____________________________________________   LABS (all labs ordered are listed, but only abnormal results are displayed)  Labs Reviewed  COMPREHENSIVE METABOLIC PANEL - Abnormal; Notable for the following components:      Result Value   Sodium 132 (*)    Potassium 6.4 (*)    Chloride 86 (*)    CO2 18 (*)    Glucose, Bld 144 (*)    BUN 122 (*)    Creatinine, Ser 10.39 (*)    Calcium 8.7 (*)    Albumin 3.0 (*)    Total Bilirubin 3.2 (*)    GFR calc non Af Amer 5 (*)    GFR calc Af Amer 6 (*)    Anion gap 28 (*)    All other components within normal limits  CBC - Abnormal; Notable for the following components:   RBC 4.18 (*)    Hemoglobin 12.9 (*)    RDW 20.1 (*)    Platelets 113 (*)    All other components within normal limits  GLUCOSE, CAPILLARY - Abnormal; Notable for the following  components:   Glucose-Capillary 120 (*)    All other components within normal limits  RESPIRATORY PANEL BY RT PCR (FLU A&B, COVID)  LIPASE, BLOOD   ____________________________________________  EKG  ED ECG REPORT I, Blake Divine, the attending physician, personally viewed and interpreted this ECG.   Date: 12/15/2019  EKG Time: 11:36  Rate: 91  Rhythm: normal sinus rhythm  Axis: Normal  Intervals:Prolonged QT  ST&T Change: None   PROCEDURES  Procedure(s) performed (including Critical Care):  .Critical Care Performed by: Blake Divine, MD Authorized by: Blake Divine, MD   Critical care provider statement:    Critical care time (minutes):  45   Critical care time was exclusive of:  Separately billable procedures and treating other patients and teaching time   Critical care was necessary to treat or prevent imminent or life-threatening deterioration of the following conditions:  Metabolic crisis   Critical care was time spent personally by me on the following activities:  Discussions with consultants, evaluation of patient's response to treatment, examination of patient, ordering and performing treatments and interventions, ordering and review of laboratory studies, ordering and review of radiographic studies, pulse oximetry, re-evaluation of patient's condition, obtaining history from patient or surrogate and review of old charts   I assumed direction of critical care for this patient from another provider in my specialty: no       ____________________________________________   INITIAL IMPRESSION / ASSESSMENT AND PLAN / ED COURSE       44 year old male with history of ESRD on HD presents to the ED after missing dialysis for the past 10 days, now complains of nausea, diarrhea, and a hemorrhoid.  Lab work obtained from triage is significant for BUN of 122 and potassium of 6.5.  No EKG changes noted.  Patient's TDC does appear to have backed out and I have concerns  whether it would be functional.  Case discussed with Dr. Juleen China of nephrology, who will have the patient  brought down for dialysis and evaluate the catheter there.  Patient's TDC is nonfunctional and Dr. Clayborn Bigness reached out to vascular surgery, who will arrange for placement of temporary dialysis catheter.  He will then have tunneled dialysis catheter replaced tomorrow or the next day pending his Covid results.  Patient will require admission to the hospitalist service in the meantime.  Patient being taken for temporary catheter placement now, afterwards will receive dialysis and then return to the ED for admission.  Oncoming provider notified of patient's status and will reevaluate when he returns to the ED.      ____________________________________________   FINAL CLINICAL IMPRESSION(S) / ED DIAGNOSES  Final diagnoses:  Hyperkalemia  ESRD on dialysis [facility])     ED Discharge Orders    None       Note:  This document was prepared using Dragon voice recognition software and may include unintentional dictation errors.   Blake Divine, MD 12/15/19 1534

## 2019-12-15 NOTE — ED Notes (Signed)
Pt given a sandwich tray and a cup of water.

## 2019-12-15 NOTE — Consult Note (Signed)
[facility] Vascular Consult Note  MRN : [id number]  Brett Small is a 44 y.o. (1976/03/22) male who presents with chief complaint of  Chief Complaint  Patient presents with  . Dialysis   History of Present Illness:  Patient is admitted to the hospital after missing dialysis for approximately one week due to "transportation issues" with hyperkalemia (K: 6.4).    In dialysis today, the patients PermCath was found to be clotted / nonfunctional with its cuff exposed. Vascular Surgery was consulted by Dr. Juleen China for emergent access placed with nephrology would like to initiate dialysis emergently.  Patient denies any fever, nausea vomiting.  Denies chest pain is experiencing increased shortness of breath.  Current Facility-Administered Medications  Medication Dose Route Frequency Provider Last Rate Last Admin  . [START ON 12/16/2019] Chlorhexidine Gluconate Cloth 2 % PADS 6 each  6 each Topical Q0600 Lavonia Dana, MD       Past Medical History:  Diagnosis Date  . ESRD (end stage renal disease) ([place])   . Hypertension   . PAD (peripheral artery disease) (HCC)    Required aortofemoral stent-which had closed and had to redo the procedure and ischemia of limb.  . Peripheral vascular disease ([place])   . Renal disorder   . Secondary hyperparathyroidism of renal origin [facility])    Past Surgical History:  Procedure Laterality Date  . A/V SHUNT INTERVENTION Left 01/19/2019   Procedure: LEFT UPPER EXTREMITY A/V SHUNTOGRAM / UPPER EXTREMITY ANGIOGRAM;  Surgeon: Algernon Huxley, MD;  Location: [place] CV LAB;  Service: Cardiovascular;  Laterality: Left;  . A/V SHUNT INTERVENTION Left 03/02/2019   Procedure: A/V SHUNT INTERVENTION;  Surgeon: Algernon Huxley, MD;  Location: [place] [facility];  Service: Cardiovascular;  Laterality: Left;  . AORTA - FEMORAL ARTERY BYPASS GRAFT    . AV FISTULA PLACEMENT Left 12/26/2018   Procedure: INSERTION OF GORE STRETCH VASCULAR 4-7MM X   45CM IN LEFT UPPER ARM;  Surgeon: Marty Heck, MD;  Location: [place];  Service: Vascular;  Laterality: Left;  . AV FISTULA PLACEMENT Right 08/12/2019   Procedure: INSERTION OF ARTERIOVENOUS (AV) GORE-TEX GRAFT ARM;  Surgeon: Algernon Huxley, MD;  Location: [facility] ORS;  Service: Vascular;  Laterality: Right;  . DIALYSIS/PERMA CATHETER REMOVAL N/A 02/06/2019   Procedure: DIALYSIS/PERMA CATHETER REMOVAL;  Surgeon: Algernon Huxley, MD;  Location: [place] CV LAB;  Service: Cardiovascular;  Laterality: N/A;  . ESOPHAGOGASTRODUODENOSCOPY N/A 10/07/2019   Procedure: ESOPHAGOGASTRODUODENOSCOPY (EGD);  Surgeon: Lin Landsman, MD;  Location: [facility]OSCOPY;  Service: Gastroenterology;  Laterality: N/A;  . LEFT HEART CATH AND CORONARY ANGIOGRAPHY Right 07/10/2019   Procedure: LEFT HEART CATH AND CORONARY ANGIOGRAPHY;  Surgeon: Dionisio David, MD;  Location: [place] CV LAB;  Service: Cardiovascular;  Laterality: Right;  . PERIPHERAL VASCULAR THROMBECTOMY Left 07/28/2019   Procedure: Left Upper Extremity Dialysis Access Declot;  Surgeon: Katha Cabal, MD;  Location: [place] CV LAB;  Service: Cardiovascular;  Laterality: Left;  . REMOVAL OF A DIALYSIS CATHETER Right 08/26/2019   Procedure: REMOVAL OF A DIALYSIS CATHETER;  Surgeon: Algernon Huxley, MD;  Location: [facility] ORS;  Service: Vascular;  Laterality: Right;  . TEMPORARY DIALYSIS CATHETER N/A 08/25/2019   Procedure: TEMPORARY DIALYSIS CATHETER;  Surgeon: Katha Cabal, MD;  Location: [place] CV LAB;  Service: Cardiovascular;  Laterality: N/A;  . UPPER EXTREMITY ANGIOGRAPHY Left 08/13/2019   Procedure: UPPER EXTREMITY ANGIOGRAPHy;  Surgeon: Algernon Huxley, MD;  Location:  [place] CV LAB;  Service: Cardiovascular;  Laterality: Left;  . UPPER EXTREMITY INTERVENTION Right 11/02/2019   Procedure: UPPER EXTREMITY INTERVENTION;  Surgeon: Algernon Huxley, MD;  Location: [place] CV LAB;  Service: Cardiovascular;  Laterality:  Right;   Social History Social History   Tobacco Use  . Smoking status: Current Every Day Smoker    Packs/day: 0.50    Years: 30.00    Pack years: 15.00    Types: Cigarettes    Last attempt to quit: 06/26/2018    Years since quitting: 1.4  . Smokeless tobacco: Never Used  . Tobacco comment: smoked for 30 years   Substance Use Topics  . Alcohol use: Not Currently  . Drug use: Yes    Frequency: 1.0 times per week    Types: Marijuana   Family History Family History  Problem Relation Age of Onset  . Hypertension Other   . Diabetes Other   . Clotting disorder Father   Denies family history of peripheral artery disease, venous disease or renal disease.  Allergies  Allergen Reactions  . Sulfa Antibiotics Hives and Nausea And Vomiting  . Codeine Nausea Only   REVIEW OF SYSTEMS (Negative unless checked)  Constitutional: [] Weight loss  [] Fever  [] Chills Cardiac: [] Chest pain   [] Chest pressure   [] Palpitations   [] Shortness of breath when laying flat   [] Shortness of breath at rest   [x] Shortness of breath with exertion. Vascular:  [] Pain in legs with walking   [] Pain in legs at rest   [] Pain in legs when laying flat   [] Claudication   [] Pain in feet when walking  [] Pain in feet at rest  [] Pain in feet when laying flat   [] History of DVT   [] Phlebitis   [] Swelling in legs   [] Varicose veins   [] Non-healing ulcers Pulmonary:   [] Uses home oxygen   [] Productive cough   [] Hemoptysis   [] Wheeze  [] COPD   [] Asthma Neurologic:  [] Dizziness  [] Blackouts   [] Seizures   [] History of stroke   [] History of TIA  [] Aphasia   [] Temporary blindness   [] Dysphagia   [] Weakness or numbness in arms   [] Weakness or numbness in legs Musculoskeletal:  [] Arthritis   [] Joint swelling   [] Joint pain   [] Low back pain Hematologic:  [] Easy bruising  [] Easy bleeding   [] Hypercoagulable state   [] Anemic  [] Hepatitis Gastrointestinal:  [] Blood in stool   [] Vomiting blood  [] Gastroesophageal reflux/heartburn    [] Difficulty swallowing. Genitourinary:  [x] Chronic kidney disease   [] Difficult urination  [] Frequent urination  [] Burning with urination   [] Blood in urine Skin:  [] Rashes   [] Ulcers   [] Wounds Psychological:  [] History of anxiety   []  History of major depression.  Physical Examination  Vitals:   12/15/19 1058 12/15/19 1301 12/15/19 1302 12/15/19 1303  BP: (!) 124/96 (!) 143/102    Pulse: 94     Resp: 18  11 19   Temp: 97.7 F (36.5 C)     TempSrc: Oral     SpO2: 99%     Weight: 45.4 kg     Height: 5\' 1"  (1.549 m)      Body mass index is 18.89 kg/m. Gen: WD/WN Head: [place]/AT, No temporalis wasting Ear/Nose/Throat: Hearing grossly intact, nares w/o erythema or drainage Eyes: Sclera non-icteric, conjunctiva clear Neck: Supple, no nuchal rigidity.  No JVD.  Chest:   Right Permcath: With cuff exposed. No signs of infection.  Pulmonary:  Good air movement, clear to auscultation bilaterally.  Cardiac:  RRR, normal S1, S2, no Murmurs, rubs or gallops. Vascular:  Gastrointestinal: soft, non-tender/non-distended. No guarding/reflex.  Musculoskeletal: M/S 5/5 throughout.  Extremities without ischemic changes.  No deformity or atrophy. Mild-moderate edema in the lower extremities bilaterally Neurologic: Intact Psychiatric: Difficult to assess due to the severity of patient's illness. Dermatologic: No rashes or ulcers noted.   Lymph : No Cervical, Axillary, or Inguinal lymphadenopathy.  CBC Lab Results  Component Value Date   WBC 7.6 12/15/2019   HGB 12.9 (L) 12/15/2019   HCT 40.3 12/15/2019   MCV 96.4 12/15/2019   PLT 113 (L) 12/15/2019   BMET    Component Value Date/Time   NA 132 (L) 12/15/2019 1059   K 6.4 (HH) 12/15/2019 1059   CL 86 (L) 12/15/2019 1059   CO2 18 (L) 12/15/2019 1059   GLUCOSE 144 (H) 12/15/2019 1059   BUN 122 (H) 12/15/2019 1059   CREATININE 10.39 (H) 12/15/2019 1059   CALCIUM 8.7 (L) 12/15/2019 1059   GFRNONAA 5 (L) 12/15/2019 1059   GFRAA 6 (L)  12/15/2019 1059   Estimated Creatinine Clearance: 5.8 mL/min (A) (by C-G formula based on SCr of 10.39 mg/dL (H)).  COAG Lab Results  Component Value Date   INR 2.7 (H) 11/04/2019   INR 1.9 (H) 10/28/2019   INR 2.4 (H) 10/28/2019   Radiology DG Chest Portable 1 View  Result Date: 12/15/2019 CLINICAL DATA:  Dialysis catheter EXAM: PORTABLE CHEST 1 VIEW COMPARISON:  10/28/2019 FINDINGS: Right dialysis catheter tip overlies right atrium. Small left pleural effusion and left basilar atelectasis. Stable heart size. IMPRESSION: Right dialysis catheter tip overlies right atrium. Small left pleural effusion and left basilar atelectasis. Electronically Signed   By: Macy Mis M.D.   On: 12/15/2019 13:48   Assessment/Plan Patient is admitted to the hospital after missing dialysis for approximately one week due to "transportation issues" with hyperkalemia (K: 6.4).    1. Non-Functioning Dialysis Access: We will proceed with temporary dialysis catheter placement at this time.  Risks and benefits discussed with patient and/or family, and the catheter will be placed to allow immediate initiation of dialysis.  Will plan permcath exchange either Wed or Thursday. Will make NPO after midnight in preparation for tomorrow.   2. Hyperlipidemia: On Eliquis and statin for medical management  Discussed with Dr. Francene Castle, PA-C  12/15/2019 2:32 PM

## 2019-12-15 NOTE — ED Notes (Signed)
Pt presents vis EMS  Per EMS he has missed 1 week of treatments

## 2019-12-15 NOTE — ED Notes (Signed)
Sent rainbow to lab. 

## 2019-12-15 NOTE — Progress Notes (Signed)
[facility]  ROUNDING NOTE   Subjective:   Mr. Yusif Swagerty presents to [facility] ED for nausea, vomiting, diarrhea. Last hemodialysis was 2/13. He has hyperkalemia. He was brought down to the dialysis unit where we found patient's dialysis catheter to have the cuff exposed. In addition, catheter not able to push or pull, despite repositioning.   Objective:  Vital signs in last 24 hours:  Temp:  [97.7 F (36.5 C)] 97.7 F (36.5 C) (02/23 1058) Pulse Rate:  [94] 94 (02/23 1058) Resp:  [11-19] 19 (02/23 1303) BP: (124-143)/(96-102) 143/102 (02/23 1301) SpO2:  [99 %] 99 % (02/23 1058) Weight:  [45.4 kg] 45.4 kg (02/23 1058)  Weight change:  Filed Weights   12/15/19 1058  Weight: 45.4 kg    Intake/Output: No intake/output data recorded.   Intake/Output this shift:  No intake/output data recorded.  Physical Exam: General: NAD,   Head: Normocephalic, atraumatic. Moist oral mucosal membranes  Eyes: Anicteric, PERRL  Neck: Supple, trachea midline  Lungs:  Clear to auscultation  Heart: Regular rate and rhythm  Abdomen:  Soft, nontender,   Extremities:  no peripheral edema.  Neurologic: Nonfocal, moving all four extremities  Skin: No lesions  Access: RIJ permcath with cuff exposure       Basic Metabolic Panel: Recent Labs  Lab 12/15/19 1059  NA 132*  K 6.4*  CL 86*  CO2 18*  GLUCOSE 144*  BUN 122*  CREATININE 10.39*  CALCIUM 8.7*    Liver Function Tests: Recent Labs  Lab 12/15/19 1059  AST 26  ALT 17  ALKPHOS 92  BILITOT 3.2*  PROT 6.6  ALBUMIN 3.0*   Recent Labs  Lab 12/15/19 1059  LIPASE 28   No results for input(s): AMMONIA in the last 168 hours.  CBC: Recent Labs  Lab 12/15/19 1059  WBC 7.6  HGB 12.9*  HCT 40.3  MCV 96.4  PLT 113*    Cardiac Enzymes: No results for input(s): CKTOTAL, CKMB, CKMBINDEX, TROPONINI in the last 168 hours.  BNP: Invalid input(s): POCBNP  CBG: Recent Labs  Lab 12/15/19 1311  GLUCAP 120*     Microbiology: Results for orders placed or performed during the hospital encounter of 10/27/19  Blood Culture (routine x 2)     Status: Abnormal   Collection Time: 10/27/19  1:07 PM   Specimen: BLOOD  Result Value Ref Range Status   Specimen Description   Final    BLOOD BLOOD RIGHT HAND Performed at [facility], [address], [place], [place] [address]    Special Requests   Final    BOTTLES DRAWN AEROBIC AND ANAEROBIC Blood Culture results may not be optimal due to an inadequate volume of blood received in culture bottles Performed at [facility], [address] [place], [place] [address]    Culture  Setup Time   Final    GRAM POSITIVE RODS AEROBIC BOTTLE ONLY CRITICAL RESULT CALLED TO, READ BACK BY AND VERIFIED WITH: SCOTT HALL 10/28/19 @ 2210  [place] Performed at [facility], [address] [place], [place] [address]    Culture CLOSTRIDIUM SPECIES (A)  Final   Report Status 10/31/2019 FINAL  Final  Blood Culture (routine x 2)     Status: None   Collection Time: 10/27/19  1:07 PM   Specimen: BLOOD  Result Value Ref Range Status   Specimen Description BLOOD BLOOD LEFT HAND  Final   Special Requests   Final    BOTTLES DRAWN AEROBIC ONLY  Blood Culture results may not be optimal due to an inadequate volume of blood received in culture bottles   Culture   Final    NO GROWTH 5 DAYS Performed at [facility], [address] [place], [place] [address]    Report Status 11/01/2019 FINAL  Final  SARS CORONAVIRUS 2 (TAT 6-24 HRS) Nasopharyngeal Nasopharyngeal Swab     Status: None   Collection Time: 10/27/19  3:54 PM   Specimen: Nasopharyngeal Swab  Result Value Ref Range Status   SARS Coronavirus 2 NEGATIVE NEGATIVE Final    Comment: (NOTE) SARS-CoV-2 target nucleic acids are NOT DETECTED. The SARS-CoV-2 RNA is generally detectable in upper and lower respiratory specimens during the acute phase of infection. Negative results do not  preclude SARS-CoV-2 infection, do not rule out co-infections with other pathogens, and should not be used as the sole basis for treatment or other patient management decisions. Negative results must be combined with clinical observations, patient history, and epidemiological information. The expected result is Negative. Fact Sheet for Patients: SugarRoll.be Fact Sheet for Healthcare Providers: https://www.woods-mathews.com/ This test is not yet approved or cleared by the [place] FDA and  has been authorized for detection and/or diagnosis of SARS-CoV-2 by FDA under an Emergency Use Authorization (EUA). This EUA will remain  in effect (meaning this test can be used) for the duration of the COVID-19 declaration under Section 56 4(b)(1) of the Act, 21 U.S.C. section 360bbb-3(b)(1), unless the authorization is terminated or revoked sooner. Performed at [facility], [place] [address], [place], [place] [address]   MRSA PCR Screening     Status: Abnormal   Collection Time: 10/27/19  9:52 PM   Specimen: Nasopharyngeal  Result Value Ref Range Status   MRSA by PCR POSITIVE (A) NEGATIVE Final    Comment:        The GeneXpert MRSA Assay (FDA approved for NASAL specimens only), is one component of a comprehensive MRSA colonization surveillance program. It is not intended to diagnose MRSA infection nor to guide or monitor treatment for MRSA infections. RESULT CALLED TO, READ BACK BY AND VERIFIED WITH: Shary Key RN [phone] 10/27/19 H[place] Performed at [facility], [address] [place], [place] [address]     Coagulation Studies: No results for input(s): LABPROT, INR in the last 72 hours.  Urinalysis: No results for input(s): COLORURINE, LABSPEC, PHURINE, GLUCOSEU, HGBUR, BILIRUBINUR, KETONESUR, PROTEINUR, UROBILINOGEN, NITRITE, LEUKOCYTESUR in the last 72 hours.  Invalid input(s): APPERANCEUR    Imaging: No results  found.   Medications:    . [START ON 12/16/2019] Chlorhexidine Gluconate Cloth  6 each Topical Q0600     Assessment/ Plan:  Mr. Rama Broering is a 44 y.o.white  male with end stage renal disease on hemodialysis, 43 y.o.malewith end-stage renal disease on hemodialysis, hypertension, peripheral vascular disease, depression, tobacco abuse   [place] TTS RIJ catheter  1. End stage renal disease on hemodialysis: with hyperkalemia:  TTS schedule. Last outpatient dialysis was 2/23 - emergent hemodialysis today however with complication of dialysis device.  - Consult vascular - Shift potassium until dialysis access available.   2. Anemia of chronic kidney disease. Hemoglobin 12.9 - hold EPO  3. Hypertension: elevated on [place], 143/102 Home regimen of amlodipine, carvedilol, and hydralazine.   4. Secondary hyperparathyroidism with hypophosphatemia - sevelamer with meals   LOS: 0 Carlia Bomkamp 2/23/20211:46 PM

## 2019-12-15 NOTE — Progress Notes (Signed)
Pre HD Tx Note   12/15/19 1550  Neurological  Level of Consciousness Alert  Orientation Level Oriented to person;Oriented to place;Oriented to situation  Respiratory  Respiratory Pattern Regular;Unlabored  Chest Assessment Chest expansion symmetrical  Bilateral Breath Sounds Clear;Diminished  Cardiac  Pulse Regular  Heart Sounds S1, S2  Jugular Venous Distention (JVD) No  ECG Monitor Yes  Cardiac Rhythm NSR  Antiarrhythmic device No  Vascular  R Radial Pulse +2  L Radial Pulse +2  Edema Generalized  Integumentary  Integumentary (WDL) X  Skin Color Appropriate for ethnicity  Skin Condition Dry;Itching  Musculoskeletal  Musculoskeletal (WDL) X  Generalized Weakness Yes  Gastrointestinal  Bowel Sounds Assessment Hypoactive  Last BM Date 12/15/19  GU Assessment  Genitourinary (WDL) X  Genitourinary Symptoms Anuria (ESRD )  Psychosocial  Psychosocial (WDL) WDL

## 2019-12-15 NOTE — ED Notes (Signed)
Pt being transported to Dialysis at this time by EDT Janace Hoard and Vevelyn Royals

## 2019-12-15 NOTE — H&P (Signed)
History and Physical    Brett Small [id number] DOB: [birth year] DOA: 12/15/2019  PCP: Patient, [name]   Patient coming from:home I have personally briefly reviewed patient's old medical records in [place]  Chief Complaint: Nausea and vomiting, referred to ER from dialysis center due to non compliance  HPI: Brett Small is a 44 y.o. male with medical history significant for ESRD on HD, noncompliant with dialysis due to social factors, systolic heart failure with EF less than 25-30% 10/2019, HTN, depression, anemia of CKD, hospitalized from 10/27/2019 to 11/04/2019 with sepsis secondary to pneumonia, complicated by NSTEMI, cardiac arrest x2 with ROSC,  right radial vein DVT, started on Eliquis who reports a several day history of nausea and vomiting after having missed dialysis for a week (since 2/13 )due to inability to get a ride and inclement weather.  Patient apparently presented to the dialysis center who subsequently referred him to the hospital given his missed sessions.  On initial evaluation earlier in the day, potassium was 6.4 with bicarb of 18, sodium 132, anion gap 28.  WBC was normal, hemoglobin 12.9 and he had thrombocytopenia of 113.  Dr. Juleen China brought patient for dialysis however dialysis catheter was malfunctioning, and Dr. Franchot Gallo placed temporary catheter and dialysis was subsequently performed.  Nephrology recommended patient be admitted overnight for placement of permanent catheter in the a.m.  Patient at the present time is denying complaints. ED Course: Patient vital signs unremarkable he is afebrile, BP 125/87 HR 100 with O2 sat 97% on room air.  Repeat blood work pending  Review of Systems: As per HPI otherwise 10 point review of systems negative.    Past Medical History:  Diagnosis Date  . ESRD (end stage renal disease) ([place])   . Hypertension   . PAD (peripheral artery disease) ([facility])    Required aortofemoral stent-which had closed and had to redo the  procedure and ischemia of limb.  . Peripheral vascular disease ([place])   . Renal disorder   . Secondary hyperparathyroidism of renal origin [facility])     Past Surgical History:  Procedure Laterality Date  . A/V SHUNT INTERVENTION Left 01/19/2019   Procedure: LEFT UPPER EXTREMITY A/V SHUNTOGRAM / UPPER EXTREMITY ANGIOGRAM;  Surgeon: Algernon Huxley, MD;  Location: [place] [facility];  Service: Cardiovascular;  Laterality: Left;  . A/V SHUNT INTERVENTION Left 03/02/2019   Procedure: A/V SHUNT INTERVENTION;  Surgeon: Algernon Huxley, MD;  Location: [place] [facility];  Service: Cardiovascular;  Laterality: Left;  . AORTA - FEMORAL ARTERY BYPASS GRAFT    . AV FISTULA PLACEMENT Left 12/26/2018   Procedure: INSERTION OF GORE STRETCH VASCULAR 4-7MM X  45CM IN LEFT UPPER ARM;  Surgeon: Marty Heck, MD;  Location: [place];  Service: Vascular;  Laterality: Left;  . AV FISTULA PLACEMENT Right 08/12/2019   Procedure: INSERTION OF ARTERIOVENOUS (AV) GORE-TEX GRAFT ARM;  Surgeon: Algernon Huxley, MD;  Location: [facility];  Service: Vascular;  Laterality: Right;  . DIALYSIS/PERMA CATHETER REMOVAL N/A 02/06/2019   Procedure: DIALYSIS/PERMA CATHETER REMOVAL;  Surgeon: Algernon Huxley, MD;  Location: [place] [facility];  Service: Cardiovascular;  Laterality: N/A;  . ESOPHAGOGASTRODUODENOSCOPY N/A 10/07/2019   Procedure: ESOPHAGOGASTRODUODENOSCOPY (EGD);  Surgeon: Lin Landsman, MD;  Location: [facility];  Service: Gastroenterology;  Laterality: N/A;  . LEFT HEART CATH AND CORONARY ANGIOGRAPHY Right 07/10/2019   Procedure: LEFT HEART CATH AND CORONARY ANGIOGRAPHY;  Surgeon: Dionisio David, MD;  Location: [place] [facility];  Service: Cardiovascular;  Laterality: Right;  . PERIPHERAL VASCULAR THROMBECTOMY Left 07/28/2019   Procedure: Left Upper Extremity Dialysis Access Declot;  Surgeon: Katha Cabal, MD;  Location: [place] [facility];  Service: Cardiovascular;  Laterality: Left;  . REMOVAL OF A  DIALYSIS CATHETER Right 08/26/2019   Procedure: REMOVAL OF A DIALYSIS CATHETER;  Surgeon: Algernon Huxley, MD;  Location: [facility];  Service: Vascular;  Laterality: Right;  . TEMPORARY DIALYSIS CATHETER N/A 08/25/2019   Procedure: TEMPORARY DIALYSIS CATHETER;  Surgeon: Katha Cabal, MD;  Location: [place] [facility];  Service: Cardiovascular;  Laterality: N/A;  . UPPER EXTREMITY ANGIOGRAPHY Left 08/13/2019   Procedure: UPPER EXTREMITY ANGIOGRAPHy;  Surgeon: Algernon Huxley, MD;  Location: [place] [facility];  Service: Cardiovascular;  Laterality: Left;  . UPPER EXTREMITY INTERVENTION Right 11/02/2019   Procedure: UPPER EXTREMITY INTERVENTION;  Surgeon: Algernon Huxley, MD;  Location: [place] [facility];  Service: Cardiovascular;  Laterality: Right;     reports that he has been smoking cigarettes. He has a 15.00 pack-year smoking history. He has never used smokeless tobacco. He reports previous alcohol use. He reports current drug use. Frequency: 1.00 time per week. Drug: Marijuana.  Allergies  Allergen Reactions  . Sulfa Antibiotics Hives and Nausea And Vomiting  . Codeine Nausea Only    Family History  Problem Relation Age of Onset  . Hypertension Other   . Diabetes Other   . Clotting disorder Father      Prior to Admission medications   Medication Sig Start Date End Date Taking? Authorizing Provider  amLODipine (NORVASC) 5 MG tablet Take 1 tablet (5 mg total) by mouth daily. 08/28/19   Danford, Suann Larry, MD  apixaban (ELIQUIS) 5 MG TABS tablet Take 2 tablets (10 mg total) by mouth 2 (two) times daily for 6 days. 11/04/19 11/10/19  Wyvonnia Dusky, MD  apixaban (ELIQUIS) 5 MG TABS tablet Take 1 tablet (5 mg total) by mouth 2 (two) times daily. 11/10/19 12/10/19  Wyvonnia Dusky, MD  atorvastatin (LIPITOR) 80 MG tablet Take 1 tablet (80 mg total) by mouth at bedtime. Patient not taking: Reported on 10/28/2019 01/26/19   Clapacs, Madie Reno, MD  b complex-C-folic acid 1 MG capsule  Take 1 capsule by mouth daily after supper.     [provider]  carvedilol (COREG) 25 MG tablet Take 1 tablet (25 mg total) by mouth 2 (two) times daily with a meal. 01/26/19   Clapacs, Madie Reno, MD  gabapentin (NEURONTIN) 100 MG capsule Take 1 capsule (100 mg total) by mouth 3 (three) times daily. Patient not taking: Reported on 10/28/2019 01/26/19   Clapacs, Madie Reno, MD  hydrALAZINE (APRESOLINE) 25 MG tablet Take 1 tablet (25 mg total) by mouth every 8 (eight) hours. Patient not taking: Reported on 10/28/2019 01/26/19   Clapacs, Madie Reno, MD  HYDROcodone-acetaminophen (NORCO/VICODIN) 5-325 MG tablet Take 1 tablet by mouth every 6 (six) hours as needed for moderate pain. 11/13/19   Kris Hartmann, NP  hydrOXYzine (ATARAX/VISTARIL) 25 MG tablet Take 25 mg by mouth 3 (three) times daily as needed for anxiety.    [provider]  levETIRAcetam (KEPPRA) 500 MG tablet Take 1 tablet (500 mg total) by mouth 2 (two) times daily. Patient not taking: Reported on 10/28/2019 08/17/19   MayoPete Pelt, MD  loratadine (CLARITIN) 10 MG tablet Take 1 tablet (10 mg total) by mouth daily. Patient not taking: Reported on 10/28/2019 03/04/19   Salary, Holly Bodily  D, MD  multivitamin (RENA-VIT) TABS tablet Take 1 tablet by mouth daily. Patient not taking: Reported on 10/28/2019 01/27/19   Clapacs, Madie Reno, MD  neomycin-bacitracin-polymyxin (NEOSPORIN) OINT Apply 1 application topically 2 (two) times daily. Patient not taking: Reported on 10/28/2019 01/26/19   Clapacs, Madie Reno, MD  Nutritional Supplements (FEEDING SUPPLEMENT, NEPRO CARB STEADY,) LIQD Take 237 mLs by mouth 2 (two) times daily between meals. Patient not taking: Reported on 10/28/2019 07/06/19   Epifanio Lesches, MD  sevelamer carbonate (RENVELA) 800 MG tablet Take 2 tablets (1,600 mg total) by mouth 3 (three) times daily with meals. Patient not taking: Reported on 10/28/2019 01/26/19   Gonzella Lex, MD    Physical Exam: Vitals:   12/15/19 1900 12/15/19 1915  12/15/19 1930 12/15/19 1933  BP: 131/82 118/80 112/80 125/87  Pulse: 100 (!) 101 (!) 102 100  Resp: 18 18 19    Temp:    98 F (36.7 C)  TempSrc:    Oral  SpO2:    97%  Weight:      Height:         Vitals:   12/15/19 1900 12/15/19 1915 12/15/19 1930 12/15/19 1933  BP: 131/82 118/80 112/80 125/87  Pulse: 100 (!) 101 (!) 102 100  Resp: 18 18 19    Temp:    98 F (36.7 C)  TempSrc:    Oral  SpO2:    97%  Weight:      Height:        Constitutional: cachectic appearance but in no acute distress. A&Ox3 Eyes: PERRL, lids and conjunctivae normal ENMT: Mucous membranes are moist.  Neck: normal, supple, no masses, no thyromegaly Respiratory: clear to auscultation bilaterally, no wheezing, no crackles. Normal respiratory effort. No accessory muscle use.  Cardiovascular: Regular rate and rhythm, no murmurs / rubs / gallops. No extremity edema. Site of left arm access in bandage. 2+ pedal pulses. No carotid bruits. Abdomen: no tenderness, no masses palpated. No hepatosplenomegaly. Bowel sounds positive.  Musculoskeletal: no clubbing / cyanosis. No joint deformity upper and lower extremities.  Skin: no rashes, lesions, ulcers.  Neurologic: No gross focal neurologic deficit. Psychiatric: Normal mood and affect.   Labs on Admission: I have personally reviewed following labs and imaging studies  CBC: Recent Labs  Lab 12/15/19 1059  WBC 7.6  HGB 12.9*  HCT 40.3  MCV 96.4  PLT 123456*   Basic Metabolic Panel: Recent Labs  Lab 12/15/19 1059  NA 132*  K 6.4*  CL 86*  CO2 18*  GLUCOSE 144*  BUN 122*  CREATININE 10.39*  CALCIUM 8.7*   GFR: Estimated Creatinine Clearance: 5.8 mL/min (A) (by C-G formula based on SCr of 10.39 mg/dL (H)). Liver Function Tests: Recent Labs  Lab 12/15/19 1059  AST 26  ALT 17  ALKPHOS 92  BILITOT 3.2*  PROT 6.6  ALBUMIN 3.0*   Recent Labs  Lab 12/15/19 1059  LIPASE 28   No results for input(s): AMMONIA in the last 168  hours. Coagulation Profile: No results for input(s): INR, PROTIME in the last 168 hours. Cardiac Enzymes: No results for input(s): CKTOTAL, CKMB, CKMBINDEX, TROPONINI in the last 168 hours. BNP (last 3 results) No results for input(s): PROBNP in the last 8760 hours. HbA1C: No results for input(s): HGBA1C in the last 72 hours. CBG: Recent Labs  Lab 12/15/19 1311  GLUCAP 120*   Lipid Profile: No results for input(s): CHOL, HDL, LDLCALC, TRIG, CHOLHDL, LDLDIRECT in the last 72 hours. Thyroid Function Tests: No  results for input(s): TSH, T4TOTAL, FREET4, T3FREE, THYROIDAB in the last 72 hours. Anemia Panel: No results for input(s): VITAMINB12, FOLATE, FERRITIN, TIBC, IRON, RETICCTPCT in the last 72 hours. Urine analysis:    Component Value Date/Time   COLORURINE AMBER (A) 08/05/2019 2049   APPEARANCEUR CLOUDY (A) 08/05/2019 2049   LABSPEC 1.023 08/05/2019 2049   PHURINE 6.0 08/05/2019 2049   GLUCOSEU NEGATIVE 08/05/2019 2049   HGBUR MODERATE (A) 08/05/2019 2049   [place] NEGATIVE 08/05/2019 2049   [place] 08/05/2019 2049   PROTEINUR 100 (A) 08/05/2019 2049   NITRITE NEGATIVE 08/05/2019 2049   LEUKOCYTESUR SMALL (A) 08/05/2019 2049    Radiological Exams on Admission: PERIPHERAL VASCULAR CATHETERIZATION  Result Date: 12/15/2019 See op note  DG Chest Portable 1 View  Result Date: 12/15/2019 CLINICAL DATA:  Dialysis catheter EXAM: PORTABLE CHEST 1 VIEW COMPARISON:  10/28/2019 FINDINGS: Right dialysis catheter tip overlies right atrium. Small left pleural effusion and left basilar atelectasis. Stable heart size. IMPRESSION: Right dialysis catheter tip overlies right atrium. Small left pleural effusion and left basilar atelectasis. Electronically Signed   By: Macy Mis M.D.   On: 12/15/2019 13:48      Assessment/Plan Principal Problem:   Complication of vascular dialysis catheter   Non-compliance with renal dialysis ([place])   End stage renal disease on  dialysis [facility]) -Patient had placement of temporary dialysis catheter on 12/15/2019 due to nonfunctioning PermCath -Consult Dr. Lucky Cowboy for placement of permanent dialysis access -Consult Dr. Juleen China for continuation of dialysis -Case management to address problems with compliance with dialysis    Hypertension -Continue amlodipine, hydralazine, carvedilol pending med reconciliation    Chronic systolic CHF (congestive heart failure) ([place]) -Continue Coreg.  Not currently on ACE/ARB -Appears euvolemic    History of DVT R forearm radial vein 10/28/19 -Patient was discharged on 11/03/2022 on Eliquis compliance uncertain -Ultrasound showed right forearm radial vein DVT with low thrombus burden -We will place on heparin subcu prophylaxis pending verification of Eliquis compliance.  Though given thrombocytopenia 113 and low thrombus burden DVT, need to restart Eliquis may be doubtful plan-defer decision on Eliquis given planned vascular procedure in the a.m.    History of cardiac arrest 10/2019 -Patient's last hospitalization in January [id number] was complicated with cardiac arrest x2.  Had PEA   DVT prophylaxis: heparin  Code Status: full code  Family Communication: none  Disposition Plan: Back to previous home environment Consults called: nephrology, Dr. Juleen China. Vascular, Dr. Bonney Roussel MD [facility]     12/15/2019, 9:06 PM

## 2019-12-16 ENCOUNTER — Encounter
Admission: EM | Disposition: A | Discharge status: Home / Self Care | Payer: Self-pay | Source: Home / Self Care | Attending: Emergency Medicine

## 2019-12-16 ENCOUNTER — Other Ambulatory Visit (INDEPENDENT_AMBULATORY_CARE_PROVIDER_SITE_OTHER): Payer: Self-pay | Admitting: Vascular Surgery

## 2019-12-16 ENCOUNTER — Encounter: Payer: Self-pay | Admitting: Cardiology

## 2019-12-16 DIAGNOSIS — N186 End stage renal disease: Secondary | ICD-10-CM

## 2019-12-16 DIAGNOSIS — Z9115 Patient's noncompliance with renal dialysis: Secondary | ICD-10-CM

## 2019-12-16 DIAGNOSIS — T829XXA Unspecified complication of cardiac and vascular prosthetic device, implant and graft, initial encounter: Secondary | ICD-10-CM | POA: Diagnosis not present

## 2019-12-16 DIAGNOSIS — T8241XA Breakdown (mechanical) of vascular dialysis catheter, initial encounter: Secondary | ICD-10-CM | POA: Diagnosis not present

## 2019-12-16 DIAGNOSIS — N185 Chronic kidney disease, stage 5: Secondary | ICD-10-CM

## 2019-12-16 DIAGNOSIS — F321 Major depressive disorder, single episode, moderate: Secondary | ICD-10-CM

## 2019-12-16 DIAGNOSIS — I10 Essential (primary) hypertension: Secondary | ICD-10-CM

## 2019-12-16 DIAGNOSIS — E875 Hyperkalemia: Secondary | ICD-10-CM

## 2019-12-16 DIAGNOSIS — L8990 Pressure ulcer of unspecified site, unspecified stage: Secondary | ICD-10-CM | POA: Insufficient documentation

## 2019-12-16 DIAGNOSIS — Z992 Dependence on renal dialysis: Secondary | ICD-10-CM

## 2019-12-16 DIAGNOSIS — Z86718 Personal history of other venous thrombosis and embolism: Secondary | ICD-10-CM

## 2019-12-16 HISTORY — PX: DIALYSIS/PERMA CATHETER INSERTION: CATH118288

## 2019-12-16 LAB — BASIC METABOLIC PANEL
Anion gap: 20 — ABNORMAL HIGH (ref 5–15)
Anion gap: 18 — ABNORMAL HIGH (ref 5–15)
BUN: 47 mg/dL — ABNORMAL HIGH (ref 6–20)
BUN: 52 mg/dL — ABNORMAL HIGH (ref 6–20)
CO2: 22 mmol/L (ref 22–32)
CO2: 23 mmol/L (ref 22–32)
Calcium: 8.8 mg/dL — ABNORMAL LOW (ref 8.9–10.3)
Calcium: 8.3 mg/dL — ABNORMAL LOW (ref 8.9–10.3)
Chloride: 95 mmol/L — ABNORMAL LOW (ref 98–111)
Chloride: 97 mmol/L — ABNORMAL LOW (ref 98–111)
Creatinine, Ser: 5.44 mg/dL — ABNORMAL HIGH (ref 0.61–1.24)
Creatinine, Ser: 6.11 mg/dL — ABNORMAL HIGH (ref 0.61–1.24)
GFR calc Af Amer: 14 mL/min — ABNORMAL LOW (ref >60)
GFR calc Af Amer: 12 mL/min — ABNORMAL LOW (ref >60)
GFR calc non Af Amer: 12 mL/min — ABNORMAL LOW (ref >60)
GFR calc non Af Amer: 10 mL/min — ABNORMAL LOW (ref >60)
Glucose, Bld: 134 mg/dL — ABNORMAL HIGH (ref 70–99)
Glucose, Bld: 118 mg/dL — ABNORMAL HIGH (ref 70–99)
Potassium: 4.2 mmol/L (ref 3.5–5.1)
Potassium: 4.4 mmol/L (ref 3.5–5.1)
Sodium: 137 mmol/L (ref 135–145)
Sodium: 138 mmol/L (ref 135–145)

## 2019-12-16 LAB — SURGICAL PCR SCREEN
MRSA, PCR: NEGATIVE
Staphylococcus aureus: NEGATIVE

## 2019-12-16 LAB — PROTIME-INR
INR: 1.7 — ABNORMAL HIGH (ref 0.8–1.2)
Prothrombin Time: 19.6 seconds — ABNORMAL HIGH (ref 11.4–15.2)

## 2019-12-16 LAB — CBC
HCT: 36.3 % — ABNORMAL LOW (ref 39.0–52.0)
Hemoglobin: 11.5 g/dL — ABNORMAL LOW (ref 13.0–17.0)
MCH: 30.8 pg (ref 26.0–34.0)
MCHC: 31.7 g/dL (ref 30.0–36.0)
MCV: 97.3 fL (ref 80.0–100.0)
Platelets: 75 10*3/uL — ABNORMAL LOW (ref 150–400)
RBC: 3.73 MIL/uL — ABNORMAL LOW (ref 4.22–5.81)
RDW: 19.8 % — ABNORMAL HIGH (ref 11.5–15.5)
WBC: 6.6 10*3/uL (ref 4.0–10.5)
nRBC: 0.6 % — ABNORMAL HIGH (ref 0.0–0.2)

## 2019-12-16 SURGERY — DIALYSIS/PERMA CATHETER INSERTION
Anesthesia: Moderate Sedation | Laterality: Right

## 2019-12-16 MED ORDER — FAMOTIDINE 20 MG PO TABS: 40.0000 mg | ORAL_TABLET | Freq: Once | ORAL | Status: DC | PRN

## 2019-12-16 MED ORDER — SODIUM CHLORIDE 0.9 % IV SOLN: INTRAVENOUS | Status: DC

## 2019-12-16 MED ORDER — HYDROMORPHONE HCL 1 MG/ML IJ SOLN: 1.0000 mg | Freq: Once | INTRAMUSCULAR | Status: DC | PRN

## 2019-12-16 MED ORDER — NALOXONE HCL 2 MG/2ML IJ SOSY
PREFILLED_SYRINGE | INTRAMUSCULAR | Status: AC
  Administered 2019-12-16: 0.4 mg
  Filled 2019-12-16: qty 2

## 2019-12-16 MED ORDER — MIDAZOLAM HCL 5 MG/5ML IJ SOLN
INTRAMUSCULAR | Status: AC
  Filled 2019-12-16: qty 5

## 2019-12-16 MED ORDER — ASCORBIC ACID 500 MG PO TABS: 500.0000 mg | ORAL_TABLET | Freq: Every day | ORAL | 0 refills | Status: DC

## 2019-12-16 MED ORDER — FLUMAZENIL 0.5 MG/5ML IV SOLN
INTRAVENOUS | Status: AC
  Administered 2019-12-16: 0.2 mg via INTRAVENOUS
  Filled 2019-12-16: qty 5

## 2019-12-16 MED ORDER — METHYLPREDNISOLONE SODIUM SUCC 125 MG IJ SOLR: 125.0000 mg | Freq: Once | INTRAMUSCULAR | Status: DC | PRN

## 2019-12-16 MED ORDER — FENTANYL CITRATE (PF) 100 MCG/2ML IJ SOLN
INTRAMUSCULAR | Status: AC
  Filled 2019-12-16: qty 2

## 2019-12-16 MED ORDER — MIDAZOLAM HCL 2 MG/2ML IJ SOLN
INTRAMUSCULAR | Status: DC | PRN
  Administered 2019-12-16: 2 mg via INTRAVENOUS

## 2019-12-16 MED ORDER — APIXABAN 5 MG PO TABS: 5.0000 mg | ORAL_TABLET | Freq: Two times a day (BID) | ORAL | 0 refills | Status: AC

## 2019-12-16 MED ORDER — NALOXONE HCL 0.4 MG/ML IJ SOLN: 0.4000 mg | INTRAMUSCULAR | Status: DC | PRN

## 2019-12-16 MED ORDER — CEFAZOLIN SODIUM-DEXTROSE 1-4 GM/50ML-% IV SOLN: 1.0000 g | Freq: Once | INTRAVENOUS | Status: AC

## 2019-12-16 MED ORDER — RENA-VITE PO TABS: 1.0000 | ORAL_TABLET | Freq: Every day | ORAL | Status: DC

## 2019-12-16 MED ORDER — ONDANSETRON HCL 4 MG/2ML IJ SOLN: 4.0000 mg | Freq: Four times a day (QID) | INTRAMUSCULAR | Status: DC | PRN

## 2019-12-16 MED ORDER — FENTANYL CITRATE (PF) 100 MCG/2ML IJ SOLN
INTRAMUSCULAR | Status: DC | PRN
  Administered 2019-12-16: 50 ug via INTRAVENOUS

## 2019-12-16 MED ORDER — NEPRO/CARBSTEADY PO LIQD: 237.0000 mL | Freq: Two times a day (BID) | ORAL | Status: DC

## 2019-12-16 MED ORDER — CEFAZOLIN SODIUM-DEXTROSE 1-4 GM/50ML-% IV SOLN
INTRAVENOUS | Status: AC
  Administered 2019-12-16: 1 g via INTRAVENOUS
  Filled 2019-12-16: qty 50

## 2019-12-16 MED ORDER — MIDAZOLAM HCL 2 MG/ML PO SYRP: 8.0000 mg | ORAL_SOLUTION | Freq: Once | ORAL | Status: DC | PRN

## 2019-12-16 MED ORDER — DIPHENHYDRAMINE HCL 50 MG/ML IJ SOLN: 50.0000 mg | Freq: Once | INTRAMUSCULAR | Status: DC | PRN

## 2019-12-16 MED ORDER — HYDROMORPHONE HCL 1 MG/ML IJ SOLN
1.0000 mg | Freq: Once | INTRAMUSCULAR | Status: AC
  Administered 2019-12-16: 1 mg via INTRAVENOUS
  Filled 2019-12-16: qty 1

## 2019-12-16 MED ORDER — FLUMAZENIL 0.5 MG/5ML IV SOLN: 0.2000 mg | Freq: Once | INTRAVENOUS | Status: AC

## 2019-12-16 MED ORDER — ASCORBIC ACID 500 MG PO TABS: 500.0000 mg | ORAL_TABLET | Freq: Two times a day (BID) | ORAL | Status: DC

## 2019-12-16 SURGICAL SUPPLY — 7 items
CATH PALINDROME RT-P 15FX19CM (CATHETERS) ×2 IMPLANT
CATH PALINDROME-P 19CM W/VT (CATHETERS) ×2 IMPLANT
GUIDEWIRE SUPER STIFF .035X180 (WIRE) ×2 IMPLANT
NDL ENTRY 21GA 7CM ECHOTIP (NEEDLE) IMPLANT
NEEDLE ENTRY 21GA 7CM ECHOTIP (NEEDLE) ×3 IMPLANT
PACK ANGIOGRAPHY (CUSTOM PROCEDURE TRAY) ×2 IMPLANT
SET INTRO CAPELLA COAXIAL (SET/KITS/TRAYS/PACK) ×2 IMPLANT

## 2019-12-16 NOTE — Op Note (Signed)
OPERATIVE NOTE   PROCEDURE: 1. Insertion right IJ tunneled catheter with ultrasound and fluoroscopic guidance. 2. Removal of a right IJ tunneled dialysis catheter  PRE-OPERATIVE DIAGNOSIS: Complication of dialysis catheter, End stage renal disease  POST-OPERATIVE DIAGNOSIS: Same  SURGEON: [name], M.D.  ANESTHESIA: Local anesthetic with 1% lidocaine with epinephrine   ESTIMATED BLOOD LOSS: Minimal   FINDING(S): 1. Catheter intact   SPECIMEN(S):  Catheter  INDICATIONS:   Brett Small is a 44 y.o. male who presents with nonfunction of his catheter and critical hyperkalemia.  He therefore underwent placement of a temporary catheter.  He is now had corrective dialysis and is undergoing revision replacement of his catheter.  Risks and benefits are reviewed all questions were answered patient has agreed to proceed.   DESCRIPTION: After obtaining full informed written consent, the patient was positioned supine. The right IJ catheter and surrounding area is prepped and draped in a sterile fashion. The cuff was localized by palpation as well as visually since it is partially exposed.  Assessment of this catheter notes that the exit site is quite high close to the clavicle and then a 19 cm tip to cuff catheter is not going to sit well.  Because of this I elected to remove the existing catheter and reaccess the IJ in a completely different location and place a new catheter.    1% lidocaine with epinephrine is infiltrated into the surrounding tissues around the cuff. Small transverse incision is created at the exit site with an 11 blade scalpel and the dissection was carried up along the catheter to expose the cuff of the tunneled catheter.  The catheter cuff is then freed from the surrounding attachments and adhesions. Once the catheter has been freed circumferentially it is removed in 1 piece. Light pressure was held at the base of the neck.  Ultrasound is then placed in a sterile  sleeve ultrasound was utilized to evaluate the right internal jugular vein which is noted to be compressible.  1% lidocaine is then infiltrated at the base of the neck and extending approximately 2 cm lateral to the previous tunnel so that the catheter will exit closer to the shoulder.  Once this entire area has been adequately anesthetized with a lidocaine the internal jugular vein is reimaged with the ultrasound.  Images recorded for the permanent record.  Micropuncture needle is then used to access the internal jugular vein under direct ultrasound visualization.  Microwire was advanced under fluoroscopic guidance followed by the microsheath.  J-wire was then advanced under fluoroscopic guidance and positioned with the J-tip in the inferior vena cava.  Small counterincision was made at the wire insertion site and serial dilatation is performed.  Dilator with peel-away sheath is then inserted.  19 cm tip to cuff catheter was then advanced through the peel-away sheath.  Under fluoroscopy the tip is positioned in the mid atrium is then approximated to the chest wall.  Small incision is made at a appropriate exit site and the tunneling device is advanced from the incision to the base of the neck.  Catheter was pulled subcutaneously and under fluoroscopy positioned so that the tip is in the center of the atrium.  Catheter is then transected and the hub assembly connected per the protocol.  Both lumens aspirated and flushed easily.  Under fluoroscopy the catheter as a smooth course free of kinks with the tip in the proper location therefore 5000 units of heparin is instilled into each lumen and red caps were placed.  Catheter is then secured to the chest wall with silk.  Pursestring suture of 4-0 Monocryl was placed around the exit site.  Monocryl was also used to close the neck counterincision.  Dermabond is used to then dress the old exit site as well as the new neck counterincision.  COMPLICATIONS:  None  CONDITION: Unchanged  [name], M.D. [facility] Office: [phone]  12/16/2019,11:56 AM

## 2019-12-16 NOTE — Progress Notes (Signed)
[facility]  ROUNDING NOTE   Subjective:   Hemodialysis treatment yesterday via temp HD catheter.  Examined this morning. Scheduled for tunneled catheter placement today.   Objective:  Vital signs in last 24 hours:  Temp:  [97.6 F (36.4 C)-98.7 F (37.1 C)] 97.8 F (36.6 C) (02/24 1024) Pulse Rate:  [63-103] 84 (02/24 1147) Resp:  [11-27] 14 (02/24 1136) BP: (107-146)/(65-109) 107/67 (02/24 1147) SpO2:  [95 %-100 %] 98 % (02/24 1147) Weight:  [31 kg-45.4 kg] 31 kg (02/24 1024)  Weight change:  Filed Weights   12/15/19 1600 12/15/19 2231 12/16/19 1024  Weight: 45.4 kg 31 kg 31 kg    Intake/Output: I/O last 3 completed shifts: In: 0  Out: 2500 [Other:2500]   Intake/Output this shift:  No intake/output data recorded.  Physical Exam: General: NAD,   Head: Normocephalic, atraumatic. Moist oral mucosal membranes  Eyes: Anicteric, PERRL  Neck: Supple, trachea midline  Lungs:  Clear to auscultation  Heart: Regular rate and rhythm  Abdomen:  Soft, nontender,   Extremities:  no peripheral edema.  Neurologic: Nonfocal, moving all four extremities  Skin: No lesions  Access: Right femoral temp HD catheter.      Basic Metabolic Panel: Recent Labs  Lab 12/15/19 1059 12/15/19 2306 12/16/19 0605  NA 132* 137 138  K 6.4* 4.2 4.4  CL 86* 95* 97*  CO2 18* 22 23  GLUCOSE 144* 134* 118*  BUN 122* 47* 52*  CREATININE 10.39* 5.44* 6.11*  CALCIUM 8.7* 8.8* 8.3*    Liver Function Tests: Recent Labs  Lab 12/15/19 1059  AST 26  ALT 17  ALKPHOS 92  BILITOT 3.2*  PROT 6.6  ALBUMIN 3.0*   Recent Labs  Lab 12/15/19 1059  LIPASE 28   No results for input(s): AMMONIA in the last 168 hours.  CBC: Recent Labs  Lab 12/15/19 1059 12/15/19 2125 12/16/19 0605  WBC 7.6 7.3 6.6  HGB 12.9* 13.1 11.5*  HCT 40.3 39.8 36.3*  MCV 96.4 93.2 97.3  PLT 113* 70* 75*    Cardiac Enzymes: No results for input(s): CKTOTAL, CKMB, CKMBINDEX, TROPONINI in the  last 168 hours.  BNP: Invalid input(s): POCBNP  CBG: Recent Labs  Lab 12/15/19 1311  GLUCAP 120*    Microbiology: Results for orders placed or performed during the hospital encounter of 12/15/19  Respiratory Panel by RT PCR (Flu A&B, Covid) - Nasopharyngeal Swab     Status: None   Collection Time: 12/15/19  9:30 PM   Specimen: Nasopharyngeal Swab  Result Value Ref Range Status   SARS Coronavirus 2 by RT PCR NEGATIVE NEGATIVE Final    Comment: (NOTE) SARS-CoV-2 target nucleic acids are NOT DETECTED. The SARS-CoV-2 RNA is generally detectable in upper respiratoy specimens during the acute phase of infection. The lowest concentration of SARS-CoV-2 viral copies this assay can detect is 131 copies/mL. A negative result does not preclude SARS-Cov-2 infection and should not be used as the sole basis for treatment or other patient management decisions. A negative result may occur with  improper specimen collection/handling, submission of specimen other than nasopharyngeal swab, presence of viral mutation(s) within the areas targeted by this assay, and inadequate number of viral copies (<131 copies/mL). A negative result must be combined with clinical observations, patient history, and epidemiological information. The expected result is Negative. Fact Sheet for Patients:  PinkCheek.be Fact Sheet for Healthcare Providers:  GravelBags.it This test is not yet ap proved or cleared by the [place] FDA and  has  been authorized for detection and/or diagnosis of SARS-CoV-2 by FDA under an Emergency Use Authorization (EUA). This EUA will remain  in effect (meaning this test can be used) for the duration of the COVID-19 declaration under Section 564(b)(1) of the Act, 21 U.S.C. section 360bbb-3(b)(1), unless the authorization is terminated or revoked sooner.    Influenza A by PCR NEGATIVE NEGATIVE Final   Influenza B by PCR  NEGATIVE NEGATIVE Final    Comment: (NOTE) The Xpert Xpress SARS-CoV-2/FLU/RSV assay is intended as an aid in  the diagnosis of influenza from Nasopharyngeal swab specimens and  should not be used as a sole basis for treatment. Nasal washings and  aspirates are unacceptable for Xpert Xpress SARS-CoV-2/FLU/RSV  testing. Fact Sheet for Patients: PinkCheek.be Fact Sheet for Healthcare Providers: GravelBags.it This test is not yet approved or cleared by the [place] FDA and  has been authorized for detection and/or diagnosis of SARS-CoV-2 by  FDA under an Emergency Use Authorization (EUA). This EUA will remain  in effect (meaning this test can be used) for the duration of the  Covid-19 declaration under Section 564(b)(1) of the Act, 21  U.S.C. section 360bbb-3(b)(1), unless the authorization is  terminated or revoked. Performed at [facility], [address], [place], [place] [address]   Surgical pcr screen     Status: None   Collection Time: 12/16/19  5:19 AM   Specimen: Nasal Mucosa; Nasal Swab  Result Value Ref Range Status   MRSA, PCR NEGATIVE NEGATIVE Final   Staphylococcus aureus NEGATIVE NEGATIVE Final    Comment: (NOTE) The Xpert SA Assay (FDA approved for NASAL specimens in patients 24 years of age and older), is one component of a comprehensive surveillance program. It is not intended to diagnose infection nor to guide or monitor treatment. Performed at [facility], [address] [place], [place] [address]     Coagulation Studies: Recent Labs    12/15/19 2305/01/24 12/16/19 0605  LABPROT 18.5* 19.6*  INR 1.6* 1.7*    Urinalysis: No results for input(s): COLORURINE, LABSPEC, PHURINE, GLUCOSEU, HGBUR, BILIRUBINUR, KETONESUR, PROTEINUR, UROBILINOGEN, NITRITE, LEUKOCYTESUR in the last 72 hours.  Invalid input(s): APPERANCEUR    Imaging: PERIPHERAL VASCULAR CATHETERIZATION  Result  Date: 12/15/2019 See op note  DG Chest Portable 1 View  Result Date: 12/15/2019 CLINICAL DATA:  Dialysis catheter EXAM: PORTABLE CHEST 1 VIEW COMPARISON:  10/28/2019 FINDINGS: Right dialysis catheter tip overlies right atrium. Small left pleural effusion and left basilar atelectasis. Stable heart size. IMPRESSION: Right dialysis catheter tip overlies right atrium. Small left pleural effusion and left basilar atelectasis. Electronically Signed   By: Macy Mis M.D.   On: 12/15/2019 13:48     Medications:   . sodium chloride 10 mL/hr at 12/16/19 1030   . [MAR Hold] Chlorhexidine Gluconate Cloth  6 each Topical Q0600  . fentaNYL      . [MAR Hold] heparin  5,000 Units Subcutaneous Q12H  . midazolam         Assessment/ Plan:  Brett Small is a 44 y.o.white  male with end stage renal disease on hemodialysis, [birth year] y.o.malewith end-stage renal disease on hemodialysis, hypertension, peripheral vascular disease, depression, tobacco abuse   [facility] TTS RIJ catheter  1. End stage renal disease on hemodialysis: with hyperkalemia:   Hemodialysis treatment yesterday via temporal dialysis catheter.  Complication of dialysis device: nonfunctioning tunneled catheter with cuff exposure was removed by Dr. Delana Meyer yesterday, 2/23.  Required emergent hemodialysis yesterday for  hyperkalemia - Appreciate vascular input - New tunneled catheter for today.   2. Anemia of chronic kidney disease. Hemoglobin 11.5 - hold EPO  3. Hypertension: 107/67 Home regimen of amlodipine, carvedilol, and hydralazine.   4. Secondary hyperparathyroidism with hypophosphatemia - sevelamer with meals   LOS: 0 Brett Small 2/24/202111:53 AM

## 2019-12-16 NOTE — Progress Notes (Signed)
Hemodialysis patient known at [facility] TTS 11:00. Clinic aware of patient admission. Please contact me with any dialysis placement concerns.

## 2019-12-16 NOTE — Plan of Care (Signed)
Patient complained of nausea and severe abdominal pain over the night. Phenergan and Dilaudid administered. Patient denies nausea and pain at this time. Right femoral dialysis catheter has some bleeding. An ABD pad is covering the right catheter and NP is aware.

## 2019-12-16 NOTE — TOC Initial Note (Signed)
Transition of Care [facility]) - Initial/Assessment Note    Patient Details  Name: Brett Small MRN: [id number] Date of Birth: 1976/05/13  Transition of [facility]) CM/SW Contact:    Shelbie Ammons, RN Phone Number: 12/16/2019, 3:39 PM  Clinical Narrative:      RNCM assessed patient at bedside. Medicare observation reviewed however patient reported he did not feel like signing. Patient lives in boarding house and already has [facility] for nursing and PT and would like to continue with them. He reports that he missed his dialysis appointments last week due to the weather. Patient denies any problems with obtaining his medications and reports he uses [facility] in [place] and that he uses "public" transportation. Patient reports having rolling walker at home and that he does not need any other equipment.           Expected Discharge Plan: [place] Barriers to Discharge: Continued Medical Work up   Patient Goals and CMS Choice        Expected Discharge Plan and Services Expected Discharge Plan: [place]   Discharge Planning Services: CM Consult   Living arrangements for the past 2 months: Apartment                               Date [place]: 12/16/19 Time [place]: 49 Representative spoke with at [place]: Corene Cornea, [facility] patient active with SN and PT  Prior Living Arrangements/Services Living arrangements for the past 2 months: Apartment Lives with:: Self Patient language and need for interpreter reviewed:: Yes Do you feel safe going back to the place where you live?: Yes      Need for Family Participation in Patient Care: Yes (Comment) Care giver support system in place?: Yes (comment) Current home services: Home PT, Home RN Criminal Activity/Legal Involvement Pertinent to Current Situation/Hospitalization: No - Comment as needed  Activities of Daily Living Home Assistive Devices/Equipment: Crutches,  Eyeglasses ADL Screening (condition at time of admission) Patient's cognitive ability adequate to safely complete daily activities?: Yes Is the patient deaf or have difficulty hearing?: No Does the patient have difficulty seeing, even when wearing glasses/contacts?: No Does the patient have difficulty concentrating, remembering, or making decisions?: No Patient able to express need for assistance with ADLs?: Yes Does the patient have difficulty dressing or bathing?: No Independently performs ADLs?: Yes (appropriate for developmental age) Does the patient have difficulty walking or climbing stairs?: Yes Weakness of Legs: Both Weakness of Arms/Hands: None  Permission Sought/Granted                  Emotional Assessment Appearance:: Appears older than stated age Attitude/Demeanor/Rapport: Avoidant Affect (typically observed): Blunt Orientation: : Oriented to Self, Oriented to Place, Oriented to  Time, Oriented to Situation   Psych Involvement: No (comment)  Admission diagnosis:  Hyperkalemia [E87.5] ESRD on dialysis ([place]) [id number], [id number] Complication of vascular dialysis catheter [T82.9XXA] Patient Active Problem List   Diagnosis Date Noted  . Pressure injury of skin 12/16/2019  . Non-compliance with renal dialysis ([place]) 12/15/2019  . History of DVT R forearm radial vein 10/28/19 12/15/2019  . Complication of vascular dialysis catheter 12/15/2019  . History of cardiac arrest 10/2019 12/15/2019  . Sepsis due to pneumonia ([place]) 10/27/2019  . Generalized weakness 10/27/2019  . Cardiac arrest ([place]) 10/27/2019  . Nausea & vomiting 10/04/2019  . Arm DVT (deep venous thromboembolism), acute,  right ([place]) 10/04/2019  . Hypotension 10/04/2019  . Bronchitis 10/03/2019  . Tobacco abuse 08/24/2019  . Marijuana use 08/24/2019  . PVD (peripheral vascular disease) ([place]) 08/24/2019  . Wound dehiscence, surgical, sequela 08/24/2019  . Chronic systolic CHF (congestive heart failure) ([place])  08/24/2019  . Volume overload 08/24/2019  . Hyperkalemia 08/24/2019  . Seizure ([place])   . Malnutrition of moderate degree 08/05/2019  . Acute CHF (congestive heart failure) ([place]) 08/04/2019  . Pulmonary edema 07/27/2019  . Pleural effusion 07/20/2019  . NSTEMI (non-ST elevated myocardial infarction) ([place]) 07/09/2019  . Pneumonia 07/02/2019  . Acute liver failure 05/19/2019  . Hematemesis 05/19/2019  . Hepatitis   . Thrombocytopenia ([place])   . Coagulopathy ([place])   . Sepsis ([place]) 02/28/2019  . Lobar pneumonia ([place]) 02/28/2019  . Acute respiratory failure ([place]) 02/04/2019  . Acute respiratory failure with hypoxemia ([place]) 01/27/2019  . Depression 01/07/2019  . MDD (major depressive disorder), single episode, severe , no psychosis ([place])   . Homelessness   . Acute respiratory failure with hypoxia ([place]) 12/25/2018  . End stage renal disease on dialysis ([place]) 12/25/2018  . Hypertension 12/25/2018  . Renal osteodystrophy 12/25/2018  . Anemia 10/01/2018  . GERD (gastroesophageal reflux disease) 10/01/2018  . Malnutrition ([place]) 09/30/2018  . ESRD on hemodialysis ([place]) 09/15/2018  . HFrEF (heart failure with reduced ejection fraction) ([place]) 09/15/2018  . HLD (hyperlipidemia) 09/15/2018  . Secondary hyperparathyroidism ([place]) 08/18/2018  . Hx of fasciotomy 07/09/2018   PCP:  Patient, [name] Pharmacy:   [facility], [place] [address] [place] [address] Phone: [phone] Fax: [phone]     Social Determinants of Health (SDOH) Interventions    Readmission Risk Interventions Readmission Risk Prevention Plan 10/08/2019 08/28/2019 08/10/2019  Transportation Screening Complete Complete Complete  Medication Review Press photographer) Complete Complete Complete  PCP or Specialist appointment within 3-5 days of discharge - (No Data) -  PCP/Specialist Appt Not Complete comments - - -  [place] or Home Care Consult Complete Complete Complete  SW  Recovery Care/Counseling Consult - - Complete  Palliative Care Screening - - Patient [place] Not Applicable Not Applicable -

## 2019-12-16 NOTE — Care Management Obs Status (Signed)
[place] NOTIFICATION   Patient Details  Name: Laden Presswood MRN: [id number] Date of Birth: 04-06-1976   Medicare Observation Status Notification Given:  Yes    Shelbie Ammons, RN 12/16/2019, 3:35 PM

## 2019-12-16 NOTE — Progress Notes (Signed)
Patient returned from procedure unresponsive to verbal commands and sternal rub. Dr. Ronalee Belts contacted and reversal medications ordered. Patient responded immediately upon medication administration. Patient will be monitored per policy before transporting back to his inpatient room. Inpatient RN notified and updated.

## 2019-12-16 NOTE — Care Management Obs Status (Signed)
[place] NOTIFICATION   Patient Details  Name: Brett Small MRN: [id number] Date of Birth: December 17, 1975   Medicare Observation Status Notification Given:  No(Patient off the floor.  Will reattempt at a later time)    Beverly Sessions, RN 12/16/2019, 1:50 PM

## 2019-12-16 NOTE — H&P (Signed)
[facility] History & Physical Update  The patient was interviewed and re-examined.  The patient's previous History and Physical has been reviewed and is unchanged.  There is no change in the plan of care. We plan to proceed with the scheduled procedure.  [name], MD  12/16/2019, 11:05 AM

## 2019-12-16 NOTE — Discharge Summary (Signed)
Physician Discharge Summary  Brett Small [id number] DOB: 1976-10-11 DOA: 12/15/2019  PCP: Patient, [name]  Admit date: 12/15/2019 Discharge date: 12/16/2019  Admitted From: Home Disposition: Home  Recommendations for Outpatient Follow-up:  1. Follow up with PCP in 1-2 weeks 2. Please obtain BMP/CBC in one week 3. Please follow up on the following pending results: None  Home Health: No Equipment/Devices: None Discharge Condition: Stable CODE STATUS: Full Diet recommendation: Heart Healthy / Carb Modified   Brief/Interim Summary: Brett Small is a 44 y.o. male with medical history significant for ESRD on HD, noncompliant with dialysis due to social factors, systolic heart failure with EF less than 25-30% 10/2019, HTN, depression, anemia of CKD, hospitalized from 10/27/2019 to 11/04/2019 with sepsis secondary to pneumonia, complicated by NSTEMI, cardiac arrest x2 with ROSC,  right radial vein DVT, started on Eliquis who reports a several day history of nausea and vomiting after having missed dialysis for a week (since 2/13 )due to inability to get a ride and inclement weather.  Patient apparently presented to the dialysis center who subsequently referred him to the hospital given his missed sessions.  On initial evaluation potassium was 6.4 with bicarb of 18, sodium 132, anion gap 28.  WBC was normal, hemoglobin 12.9 and he had thrombocytopenia of 113.  Also found to have malfunctioning dialysis catheter.  A new temporary catheter was placed and Patient was taken for dialysis.  Hyperkalemia and electrolyte abnormalities resolved with dialysis. Next day on 12/16/2019, and new permanent IJ catheter was placed by vascular surgery.  Patient will resume his dialysis schedule on Tuesday, Thursday and Saturday.  He will continue rest of his home medications.  Discharge Diagnoses:  Principal Problem:   Complication of vascular dialysis catheter Active Problems:   End stage renal disease on  dialysis [facility])   Hypertension   Depression   Chronic systolic CHF (congestive heart failure) (HCC)   Non-compliance with renal dialysis ([place])   History of DVT R forearm radial vein 10/28/19   History of cardiac arrest 10/2019   Pressure injury of skin  Discharge Instructions  Discharge Instructions    Diet - low sodium heart healthy   Complete by: As directed    Increase activity slowly   Complete by: As directed      Allergies as of 12/16/2019      Reactions   Sulfa Antibiotics Hives, Nausea And Vomiting   Codeine Nausea Only      Medication List    STOP taking these medications   hydrALAZINE 25 MG tablet Commonly known as: APRESOLINE     TAKE these medications   amLODipine 5 MG tablet Commonly known as: NORVASC Take 1 tablet (5 mg total) by mouth daily.   apixaban 5 MG Tabs tablet Commonly known as: ELIQUIS Take 1 tablet (5 mg total) by mouth 2 (two) times daily. What changed: Another medication with the same name was removed. Continue taking this medication, and follow the directions you see here.   ascorbic acid 500 MG tablet Commonly known as: VITAMIN C Take 1 tablet (500 mg total) by mouth daily.   atorvastatin 80 MG tablet Commonly known as: LIPITOR Take 1 tablet (80 mg total) by mouth at bedtime.   carvedilol 25 MG tablet Commonly known as: COREG Take 1 tablet (25 mg total) by mouth 2 (two) times daily with a meal.   feeding supplement (NEPRO CARB STEADY) Liqd Take 237 mLs by mouth 2 (two) times daily between meals.   gabapentin 100  MG capsule Commonly known as: NEURONTIN Take 1 capsule (100 mg total) by mouth 3 (three) times daily.   HYDROcodone-acetaminophen 5-325 MG tablet Commonly known as: NORCO/VICODIN Take 1 tablet by mouth every 6 (six) hours as needed for moderate pain.   hydrOXYzine 25 MG tablet Commonly known as: ATARAX/VISTARIL Take 25 mg by mouth 3 (three) times daily as needed for anxiety.   levETIRAcetam 500 MG tablet Commonly  known as: KEPPRA Take 1 tablet (500 mg total) by mouth 2 (two) times daily.   loratadine 10 MG tablet Commonly known as: CLARITIN Take 1 tablet (10 mg total) by mouth daily.   b complex-C-folic acid 1 MG capsule Take 1 capsule by mouth daily after supper.   multivitamin Tabs tablet Take 1 tablet by mouth daily.   neomycin-bacitracin-polymyxin Oint Commonly known as: NEOSPORIN Apply 1 application topically 2 (two) times daily.   sevelamer carbonate 800 MG tablet Commonly known as: RENVELA Take 2 tablets (1,600 mg total) by mouth 3 (three) times daily with meals.       Allergies  Allergen Reactions  . Sulfa Antibiotics Hives and Nausea And Vomiting  . Codeine Nausea Only    Consultations:  Nephrology  Vascular surgery  Procedures/Studies: PERIPHERAL VASCULAR CATHETERIZATION  Result Date: 12/16/2019 See op note  PERIPHERAL VASCULAR CATHETERIZATION  Result Date: 12/15/2019 See op note  DG Chest Portable 1 View  Result Date: 12/15/2019 CLINICAL DATA:  Dialysis catheter EXAM: PORTABLE CHEST 1 VIEW COMPARISON:  10/28/2019 FINDINGS: Right dialysis catheter tip overlies right atrium. Small left pleural effusion and left basilar atelectasis. Stable heart size. IMPRESSION: Right dialysis catheter tip overlies right atrium. Small left pleural effusion and left basilar atelectasis. Electronically Signed   By: Macy Mis M.D.   On: 12/15/2019 13:48   Subjective: Patient was feeling better when seen this morning.  No new complaints.  Discharge Exam: Vitals:   12/16/19 1330 12/16/19 1340  BP: (!) 125/93 (!) 117/97  Pulse: 99 99  Resp: 20 17  Temp:    SpO2: 96% 98%   Vitals:   12/16/19 1245 12/16/19 1300 12/16/19 1330 12/16/19 1340  BP: (!) 142/86 (!) 130/96 (!) 125/93 (!) 117/97  Pulse: 97 96 99 99  Resp: (!) 23 14 20 17   Temp:      TempSrc:      SpO2: 97% 96% 96% 98%  Weight:      Height:        General: Pt is alert, awake, not in acute  distress Cardiovascular: RRR, S1/S2 +, no rubs, no gallops Respiratory: CTA bilaterally, no wheezing, no rhonchi Abdominal: Soft, NT, ND, bowel sounds + Extremities: no edema, no cyanosis   The results of significant diagnostics from this hospitalization (including imaging, microbiology, ancillary and laboratory) are listed below for reference.    Microbiology: Recent Results (from the past 240 hour(s))  Respiratory Panel by RT PCR (Flu A&B, Covid) - Nasopharyngeal Swab     Status: None   Collection Time: 12/15/19  9:30 PM   Specimen: Nasopharyngeal Swab  Result Value Ref Range Status   SARS Coronavirus 2 by RT PCR NEGATIVE NEGATIVE Final    Comment: (NOTE) SARS-CoV-2 target nucleic acids are NOT DETECTED. The SARS-CoV-2 RNA is generally detectable in upper respiratoy specimens during the acute phase of infection. The lowest concentration of SARS-CoV-2 viral copies this assay can detect is 131 copies/mL. A negative result does not preclude SARS-Cov-2 infection and should not be used as the sole basis for treatment or other patient  management decisions. A negative result may occur with  improper specimen collection/handling, submission of specimen other than nasopharyngeal swab, presence of viral mutation(s) within the areas targeted by this assay, and inadequate number of viral copies (<131 copies/mL). A negative result must be combined with clinical observations, patient history, and epidemiological information. The expected result is Negative. Fact Sheet for Patients:  PinkCheek.be Fact Sheet for Healthcare Providers:  GravelBags.it This test is not yet ap proved or cleared by the [place] FDA and  has been authorized for detection and/or diagnosis of SARS-CoV-2 by FDA under an Emergency Use Authorization (EUA). This EUA will remain  in effect (meaning this test can be used) for the duration of the COVID-19  declaration under Section 564(b)(1) of the Act, 21 U.S.C. section 360bbb-3(b)(1), unless the authorization is terminated or revoked sooner.    Influenza A by PCR NEGATIVE NEGATIVE Final   Influenza B by PCR NEGATIVE NEGATIVE Final    Comment: (NOTE) The Xpert Xpress SARS-CoV-2/FLU/RSV assay is intended as an aid in  the diagnosis of influenza from Nasopharyngeal swab specimens and  should not be used as a sole basis for treatment. Nasal washings and  aspirates are unacceptable for Xpert Xpress SARS-CoV-2/FLU/RSV  testing. Fact Sheet for Patients: PinkCheek.be Fact Sheet for Healthcare Providers: GravelBags.it This test is not yet approved or cleared by the [place] FDA and  has been authorized for detection and/or diagnosis of SARS-CoV-2 by  FDA under an Emergency Use Authorization (EUA). This EUA will remain  in effect (meaning this test can be used) for the duration of the  Covid-19 declaration under Section 564(b)(1) of the Act, 21  U.S.C. section 360bbb-3(b)(1), unless the authorization is  terminated or revoked. Performed at [facility], [address], [place], [place] [address]   Surgical pcr screen     Status: None   Collection Time: 12/16/19  5:19 AM   Specimen: Nasal Mucosa; Nasal Swab  Result Value Ref Range Status   MRSA, PCR NEGATIVE NEGATIVE Final   Staphylococcus aureus NEGATIVE NEGATIVE Final    Comment: (NOTE) The Xpert SA Assay (FDA approved for NASAL specimens in patients 80 years of age and older), is one component of a comprehensive surveillance program. It is not intended to diagnose infection nor to guide or monitor treatment. Performed at [facility], [address] [place], [place] [address]      Labs: BNP (last 3 results) Recent Labs    07/20/19 1029 08/03/19 0925 08/05/19 0431  BNP >4,500.0* >4,500.0* 123XX123*   Basic Metabolic Panel: Recent Labs  Lab  12/15/19 1059 12/15/19 2306 12/16/19 0605  NA 132* 137 138  K 6.4* 4.2 4.4  CL 86* 95* 97*  CO2 18* 22 23  GLUCOSE 144* 134* 118*  BUN 122* 47* 52*  CREATININE 10.39* 5.44* 6.11*  CALCIUM 8.7* 8.8* 8.3*   Liver Function Tests: Recent Labs  Lab 12/15/19 1059  AST 26  ALT 17  ALKPHOS 92  BILITOT 3.2*  PROT 6.6  ALBUMIN 3.0*   Recent Labs  Lab 12/15/19 1059  LIPASE 28   No results for input(s): AMMONIA in the last 168 hours. CBC: Recent Labs  Lab 12/15/19 1059 12/15/19 2125 12/16/19 0605  WBC 7.6 7.3 6.6  HGB 12.9* 13.1 11.5*  HCT 40.3 39.8 36.3*  MCV 96.4 93.2 97.3  PLT 113* 70* 75*   Cardiac Enzymes: No results for input(s): CKTOTAL, CKMB, CKMBINDEX, TROPONINI in the last 168 hours. BNP: Invalid input(s): POCBNP CBG:  Recent Labs  Lab 12/15/19 1311  GLUCAP 120*   D-Dimer No results for input(s): DDIMER in the last 72 hours. Hgb A1c No results for input(s): HGBA1C in the last 72 hours. Lipid Profile No results for input(s): CHOL, HDL, LDLCALC, TRIG, CHOLHDL, LDLDIRECT in the last 72 hours. Thyroid function studies No results for input(s): TSH, T4TOTAL, T3FREE, THYROIDAB in the last 72 hours.  Invalid input(s): FREET3 Anemia work up No results for input(s): VITAMINB12, FOLATE, FERRITIN, TIBC, IRON, RETICCTPCT in the last 72 hours. Urinalysis    Component Value Date/Time   COLORURINE AMBER (A) 08/05/2019 2049   APPEARANCEUR CLOUDY (A) 08/05/2019 2049   LABSPEC 1.023 08/05/2019 2049   PHURINE 6.0 08/05/2019 2049   GLUCOSEU NEGATIVE 08/05/2019 2049   HGBUR MODERATE (A) 08/05/2019 2049   [place] NEGATIVE 08/05/2019 2049   [place] 08/05/2019 2049   PROTEINUR 100 (A) 08/05/2019 2049   NITRITE NEGATIVE 08/05/2019 2049   LEUKOCYTESUR SMALL (A) 08/05/2019 2049   Sepsis Labs Invalid input(s): PROCALCITONIN,  WBC,  LACTICIDVEN Microbiology Recent Results (from the past 240 hour(s))  Respiratory Panel by RT PCR (Flu A&B, Covid) -  Nasopharyngeal Swab     Status: None   Collection Time: 12/15/19  9:30 PM   Specimen: Nasopharyngeal Swab  Result Value Ref Range Status   SARS Coronavirus 2 by RT PCR NEGATIVE NEGATIVE Final    Comment: (NOTE) SARS-CoV-2 target nucleic acids are NOT DETECTED. The SARS-CoV-2 RNA is generally detectable in upper respiratoy specimens during the acute phase of infection. The lowest concentration of SARS-CoV-2 viral copies this assay can detect is 131 copies/mL. A negative result does not preclude SARS-Cov-2 infection and should not be used as the sole basis for treatment or other patient management decisions. A negative result may occur with  improper specimen collection/handling, submission of specimen other than nasopharyngeal swab, presence of viral mutation(s) within the areas targeted by this assay, and inadequate number of viral copies (<131 copies/mL). A negative result must be combined with clinical observations, patient history, and epidemiological information. The expected result is Negative. Fact Sheet for Patients:  PinkCheek.be Fact Sheet for Healthcare Providers:  GravelBags.it This test is not yet ap proved or cleared by the [place] FDA and  has been authorized for detection and/or diagnosis of SARS-CoV-2 by FDA under an Emergency Use Authorization (EUA). This EUA will remain  in effect (meaning this test can be used) for the duration of the COVID-19 declaration under Section 564(b)(1) of the Act, 21 U.S.C. section 360bbb-3(b)(1), unless the authorization is terminated or revoked sooner.    Influenza A by PCR NEGATIVE NEGATIVE Final   Influenza B by PCR NEGATIVE NEGATIVE Final    Comment: (NOTE) The Xpert Xpress SARS-CoV-2/FLU/RSV assay is intended as an aid in  the diagnosis of influenza from Nasopharyngeal swab specimens and  should not be used as a sole basis for treatment. Nasal washings and  aspirates  are unacceptable for Xpert Xpress SARS-CoV-2/FLU/RSV  testing. Fact Sheet for Patients: PinkCheek.be Fact Sheet for Healthcare Providers: GravelBags.it This test is not yet approved or cleared by the [place] FDA and  has been authorized for detection and/or diagnosis of SARS-CoV-2 by  FDA under an Emergency Use Authorization (EUA). This EUA will remain  in effect (meaning this test can be used) for the duration of the  Covid-19 declaration under Section 564(b)(1) of the Act, 21  U.S.C. section 360bbb-3(b)(1), unless the authorization is  terminated or revoked. Performed at [facility], [phone]  [address], [place], [place] [address]   Surgical pcr screen     Status: None   Collection Time: 12/16/19  5:19 AM   Specimen: Nasal Mucosa; Nasal Swab  Result Value Ref Range Status   MRSA, PCR NEGATIVE NEGATIVE Final   Staphylococcus aureus NEGATIVE NEGATIVE Final    Comment: (NOTE) The Xpert SA Assay (FDA approved for NASAL specimens in patients 76 years of age and older), is one component of a comprehensive surveillance program. It is not intended to diagnose infection nor to guide or monitor treatment. Performed at [facility], [address] [place], [place] [address]     Time coordinating discharge: Over 30 minutes  SIGNED:  Lorella Nimrod, MD  [facility] 12/16/2019, 3:55 PM  If 7PM-7AM, please contact night-coverage [email or web address]  This record has been created using Systems analyst. Errors have been sought and corrected,but may not always be located. Such creation errors do not reflect on the standard of care.

## 2019-12-16 NOTE — Progress Notes (Signed)
Initial Nutrition Assessment  DOCUMENTATION CODES:   Non-severe (moderate) malnutrition in context of chronic illness  INTERVENTION:   Nepro Shake po BID, each supplement provides 425 kcal and 19 grams protein  Rena-vite daily   Vitamin C 500mg  po BID  NUTRITION DIAGNOSIS:   Moderate Malnutrition related to chronic illness(ESRD on HD) as evidenced by moderate fat depletion, moderate muscle depletion, severe muscle depletion.  GOAL:   Patient will meet greater than or equal to 90% of their needs  MONITOR:   PO intake, Supplement acceptance, Labs, Weight trends, Skin, I & O's  REASON FOR ASSESSMENT:   Other (Comment)(Low BMI)    ASSESSMENT:   44 y.o. male with medical history significant for ESRD on HD, noncompliant with dialysis due to social factors, systolic heart failure with EF less than 25-30% 10/2019, HTN, depression, anemia of CKD, hospitalized from 10/27/2019 to 11/04/2019 with sepsis secondary to pneumonia, complicated by NSTEMI, cardiac arrest x2 with ROSC,  right radial vein DVT, started on Eliquis who reports a several day history of nausea and vomiting after having missed dialysis for a week (since 2/13 )due to inability to get a ride and inclement weather.   Pt is well known to this RD from multiple previous admits. Pt with fair appetite and oral intake at baseline and is usually compliant with supplements while in hospital. Pt NPO for procedure this morning but has now been initiated on a renal/cho modified diet. RD will add supplements and vitamins to help pt meet his estimated needs and support losses from HD. Would consider liberal diet if pt with poor oral intake. Per chart, pt is fairly weight stable pta. Pt's admit weight is ~30lbs under his UBW; RD suspects weight is entered incorrectly.    Medications reviewed and include: heparin   Labs reviewed: BUN 52(H), creat 6.11(H), K 4.4 wnl iPTH- 126(H)- 08/25/19  Nutrition-Focused physical exam completed. Findings  are moderate fat depletions, moderate to severe muscle depletions, and no edema.   Diet Order:   Diet Order            Diet renal/carb modified with fluid restriction Diet-HS Snack? Nothing; Fluid restriction: 1200 mL Fluid; Room service appropriate? Yes; Fluid consistency: Thin  Diet effective now             EDUCATION NEEDS:   Education needs have been addressed  Skin:  Skin Assessment: Reviewed RN Assessment(ecchymosis, Stage II buttocks)  Last BM:  2/23  Height:   Ht Readings from Last 1 Encounters:  12/16/19 5\' 1"  (1.549 m)    Weight:   Wt Readings from Last 1 Encounters:  12/16/19 31 kg    Ideal Body Weight:  50.9 kg  BMI:  Body mass index is 12.91 kg/m.  Estimated Nutritional Needs:   Kcal:  1600-1800kcal/day  Protein:  80-90g/day  Fluid:  UOP +1L  Koleen Distance MS, RD, LDN Contact information available in Amion

## 2019-12-23 ENCOUNTER — Other Ambulatory Visit (INDEPENDENT_AMBULATORY_CARE_PROVIDER_SITE_OTHER): Payer: Self-pay | Admitting: Nurse Practitioner

## 2019-12-23 DIAGNOSIS — Z95820 Peripheral vascular angioplasty status with implants and grafts: Secondary | ICD-10-CM

## 2019-12-23 DIAGNOSIS — I998 Other disorder of circulatory system: Secondary | ICD-10-CM

## 2019-12-25 ENCOUNTER — Encounter (INDEPENDENT_AMBULATORY_CARE_PROVIDER_SITE_OTHER): Payer: Self-pay | Admitting: Nurse Practitioner

## 2019-12-25 ENCOUNTER — Ambulatory Visit (INDEPENDENT_AMBULATORY_CARE_PROVIDER_SITE_OTHER): Payer: Medicare Other

## 2019-12-25 ENCOUNTER — Other Ambulatory Visit: Payer: Self-pay

## 2019-12-25 ENCOUNTER — Ambulatory Visit (INDEPENDENT_AMBULATORY_CARE_PROVIDER_SITE_OTHER): Payer: Medicare Other | Admitting: Nurse Practitioner

## 2019-12-25 VITALS — BP 92/51 | HR 83 | Resp 10 | Ht 61.0 in | Wt 84.0 lb

## 2019-12-25 DIAGNOSIS — Z720 Tobacco use: Secondary | ICD-10-CM | POA: Diagnosis not present

## 2019-12-25 DIAGNOSIS — I82621 Acute embolism and thrombosis of deep veins of right upper extremity: Secondary | ICD-10-CM | POA: Diagnosis not present

## 2019-12-25 DIAGNOSIS — I998 Other disorder of circulatory system: Secondary | ICD-10-CM | POA: Diagnosis not present

## 2019-12-25 DIAGNOSIS — Z95820 Peripheral vascular angioplasty status with implants and grafts: Secondary | ICD-10-CM

## 2019-12-25 DIAGNOSIS — T829XXA Unspecified complication of cardiac and vascular prosthetic device, implant and graft, initial encounter: Secondary | ICD-10-CM

## 2019-12-25 DIAGNOSIS — J069 Acute upper respiratory infection, unspecified: Secondary | ICD-10-CM | POA: Diagnosis not present

## 2019-12-25 MED ORDER — DOXYCYCLINE HYCLATE 100 MG PO CAPS: 100.0000 mg | ORAL_CAPSULE | Freq: Two times a day (BID) | ORAL | 0 refills | Status: DC

## 2019-12-25 MED ORDER — AMOXICILLIN-POT CLAVULANATE 875-125 MG PO TABS: 1.0000 | ORAL_TABLET | Freq: Two times a day (BID) | ORAL | 0 refills | Status: AC

## 2019-12-25 NOTE — Progress Notes (Signed)
SUBJECTIVE:  Patient ID: Brett Small, male    DOB: 06/03/1976, 45 y.o.   MRN: [id number] Chief Complaint  Patient presents with  . Follow-up    U/S Follow up    HPI  Brett Small is a 44 y.o. male presents today for follow-up after thrombectomy and stent placement of the right brachial and ulnar artery.  Today the patient states that his arm feels warm he has sensation and feels much better than in January when it was ischemic.  Overall he denies any complaints with his upper arm.  The patient however does complain of a cough with productive sputum.  The patient states that this has been ongoing for 2 weeks.  The patient states that he feels somewhat short of breath at times.  The patient denies having Covid as he states that he was tested a couple weeks ago.  The patient denies having a primary care provider that he is able to contact for treatment.  Patient denies any fever or chills however endorses having some body aches.  He denies any altered mental sensation.  He denies feeling faint or losing consciousness.  He denies any TIA-like symptoms or strokelike symptoms.  Today noninvasive studies show triphasic/biphasic waveforms throughout the right upper extremity.  Compared to the prior study on 11/13/2019 the waveforms have improved.  Previously placed stent is patent.  Past Medical History:  Diagnosis Date  . ESRD (end stage renal disease) ([place])   . Hypertension   . PAD (peripheral artery disease) (HCC)    Required aortofemoral stent-which had closed and had to redo the procedure and ischemia of limb.  . Peripheral vascular disease ([place])   . Renal disorder   . Secondary hyperparathyroidism of renal origin [facility])     Past Surgical History:  Procedure Laterality Date  . A/V SHUNT INTERVENTION Left 01/19/2019   Procedure: LEFT UPPER EXTREMITY A/V SHUNTOGRAM / UPPER EXTREMITY ANGIOGRAM;  Surgeon: Algernon Huxley, MD;  Location: [place] [facility];  Service: Cardiovascular;   Laterality: Left;  . A/V SHUNT INTERVENTION Left 03/02/2019   Procedure: A/V SHUNT INTERVENTION;  Surgeon: Algernon Huxley, MD;  Location: [place] [facility];  Service: Cardiovascular;  Laterality: Left;  . AORTA - FEMORAL ARTERY BYPASS GRAFT    . AV FISTULA PLACEMENT Left 12/26/2018   Procedure: INSERTION OF GORE STRETCH VASCULAR 4-7MM X  45CM IN LEFT UPPER ARM;  Surgeon: Marty Heck, MD;  Location: [place];  Service: Vascular;  Laterality: Left;  . AV FISTULA PLACEMENT Right 08/12/2019   Procedure: INSERTION OF ARTERIOVENOUS (AV) GORE-TEX GRAFT ARM;  Surgeon: Algernon Huxley, MD;  Location: [facility];  Service: Vascular;  Laterality: Right;  . DIALYSIS/PERMA CATHETER INSERTION Right 12/16/2019   Procedure: DIALYSIS/PERMA CATHETER EXCHANGE;  Surgeon: Katha Cabal, MD;  Location: [place] [facility];  Service: Cardiovascular;  Laterality: Right;  . DIALYSIS/PERMA CATHETER REMOVAL N/A 02/06/2019   Procedure: DIALYSIS/PERMA CATHETER REMOVAL;  Surgeon: Algernon Huxley, MD;  Location: [place] [facility];  Service: Cardiovascular;  Laterality: N/A;  . ESOPHAGOGASTRODUODENOSCOPY N/A 10/07/2019   Procedure: ESOPHAGOGASTRODUODENOSCOPY (EGD);  Surgeon: Lin Landsman, MD;  Location[facility];  Service: Gastroenterology;  Laterality: N/A;  . LEFT HEART CATH AND CORONARY ANGIOGRAPHY Right 07/10/2019   Procedure: LEFT HEART CATH AND CORONARY ANGIOGRAPHY;  Surgeon: Dionisio David, MD;  Location: [place] [facility];  Service: Cardiovascular;  Laterality: Right;  . PERIPHERAL VASCULAR THROMBECTOMY Left 07/28/2019   Procedure: Left Upper Extremity Dialysis  Access [place];  Surgeon: Katha Cabal, MD;  Location: [place] [facility];  Service: Cardiovascular;  Laterality: Left;  . REMOVAL OF A DIALYSIS CATHETER Right 08/26/2019   Procedure: REMOVAL OF A DIALYSIS CATHETER;  Surgeon: Algernon Huxley, MD;  Location: [facility];  Service: Vascular;  Laterality: Right;  . TEMPORARY DIALYSIS CATHETER N/A  08/25/2019   Procedure: TEMPORARY DIALYSIS CATHETER;  Surgeon: Katha Cabal, MD;  Location: [place] [facility];  Service: Cardiovascular;  Laterality: N/A;  . TEMPORARY DIALYSIS CATHETER N/A 12/15/2019   Procedure: TEMPORARY DIALYSIS CATHETER;  Surgeon: Katha Cabal, MD;  Location: [place] [facility];  Service: Cardiovascular;  Laterality: N/A;  . UPPER EXTREMITY ANGIOGRAPHY Left 08/13/2019   Procedure: UPPER EXTREMITY ANGIOGRAPHy;  Surgeon: Algernon Huxley, MD;  Location: [place] [facility];  Service: Cardiovascular;  Laterality: Left;  . UPPER EXTREMITY INTERVENTION Right 11/02/2019   Procedure: UPPER EXTREMITY INTERVENTION;  Surgeon: Algernon Huxley, MD;  Location: [place] [facility];  Service: Cardiovascular;  Laterality: Right;    Social History   Socioeconomic History  . Marital status: Single    Spouse name: Not on file  . Number of children: Not on file  . Years of education: Not on file  . Highest education level: Not on file  Occupational History  . Not on file  Tobacco Use  . Smoking status: Current Every Day Smoker    Packs/day: 0.50    Years: 30.00    Pack years: 15.00    Types: Cigarettes    Last attempt to quit: 06/26/2018    Years since quitting: 1.4  . Smokeless tobacco: Never Used  . Tobacco comment: smoked for 30 years   Substance and Sexual Activity  . Alcohol use: Not Currently  . Drug use: Yes    Frequency: 1.0 times per week    Types: Marijuana  . Sexual activity: Not on file  Other Topics Concern  . Not on file  Social History Narrative  . Not on file   Social Determinants of Health   Financial Resource Strain:   . Difficulty of Paying Living Expenses: Not on file  Food Insecurity:   . Worried About Charity fundraiser in the Last Year: Not on file  . Ran Out of Food in the Last Year: Not on file  Transportation Needs:   . Lack of Transportation (Medical): Not on file  . Lack of Transportation (Non-Medical): Not on file  Physical  Activity:   . Days of Exercise per Week: Not on file  . Minutes of Exercise per Session: Not on file  Stress:   . Feeling of Stress : Not on file  Social Connections:   . Frequency of Communication with Friends and Family: Not on file  . Frequency of Social Gatherings with Friends and Family: Not on file  . Attends Religious Services: Not on file  . Active Member of Clubs or Organizations: Not on file  . Attends Archivist Meetings: Not on file  . Marital Status: Not on file  Intimate Partner Violence: Unknown  . Fear of Current or Ex-Partner: Patient refused  . Emotionally Abused: Patient refused  . Physically Abused: Patient refused  . Sexually Abused: Patient refused    Family History  Problem Relation Age of Onset  . Hypertension Other   . Diabetes Other   . Clotting disorder Father     Allergies  Allergen Reactions  . Sulfa Antibiotics Hives and Nausea And  Vomiting  . Codeine Nausea Only     Review of Systems   Review of Systems: Negative Unless Checked Constitutional: [] Weight loss  [] Fever  [] Chills Cardiac: [] Chest pain   []  Atrial Fibrillation  [] Palpitations   [] Shortness of breath when laying flat   [] Shortness of breath with exertion. [] Shortness of breath at rest Vascular:  [] Pain in legs with walking   [] Pain in legs with standing [] Pain in legs when laying flat   [] Claudication    [x] Pain in feet when laying flat    [x] History of DVT   [] Phlebitis   [] Swelling in legs   [] Varicose veins   [] Non-healing ulcers Pulmonary:   [] Uses home oxygen   [x] Productive cough   [] Hemoptysis   [] Wheeze  [] COPD   [] Asthma Neurologic:  [] Dizziness   [] Seizures  [] Blackouts [] History of stroke   [] History of TIA  [] Aphasia   [] Temporary Blindness   [] Weakness or numbness in arm   [] Weakness or numbness in leg Musculoskeletal:   [] Joint swelling   [] Joint pain   [] Low back pain  []  History of Knee Replacement [] Arthritis [] back Surgeries  []  Spinal Stenosis      Hematologic:  [] Easy bruising  [] Easy bleeding   [] Hypercoagulable state   [x] Anemic Gastrointestinal:  [] Diarrhea   [] Vomiting  [x] Gastroesophageal reflux/heartburn   [] Difficulty swallowing. [] Abdominal pain Genitourinary:  [x] Chronic kidney disease   [] Difficult urination  [] Anuric   [] Blood in urine [] Frequent urination  [] Burning with urination   [] Hematuria Skin:  [] Rashes   [] Ulcers [] Wounds Psychological:  [] History of anxiety   []  History of major depression  []  Memory Difficulties      OBJECTIVE:   Physical Exam  BP (!) 72/41   Pulse 83   Resp 10   Ht 5\' 1"  (1.549 m)   Wt 84 lb (38.1 kg)   BMI 15.87 kg/m   Gen: WD/WN, NAD, frail and cachectic appearing Head: [place]/AT, No temporalis wasting.  Ear/Nose/Throat: Hearing grossly intact, nares w/o erythema or drainage Eyes: PER, EOMI, sclera nonicteric.  Neck: Supple, no masses.  No JVD.  Pulmonary:   Cough, short of breath, crackles in near bases, productive sputum Cardiac: RRR Vascular:  Vessel Right Left  Radial Palpable Palpable   Gastrointestinal: soft, non-distended. No guarding/no peritoneal signs.  Musculoskeletal: M/S 5/5 throughout.  No deformity or atrophy.  Neurologic: Pain and light touch intact in extremities.  Symmetrical.  Speech is fluent. Motor exam as listed above. Psychiatric: Judgment intact, Mood & affect appropriate for pt's clinical situation. Dermatologic: No Venous rashes. No Ulcers Noted.  No changes consistent with cellulitis. Lymph : No Cervical lymphadenopathy, no lichenification or skin changes of chronic lymphedema.       ASSESSMENT AND PLAN:   1. Arm DVT (deep venous thromboembolism), acute, right (HCC) Thrombus in upper extremity is resolved.  Previously placed stent is still patent.  Noninvasive studies improved today.  Patient will follow up in 3 months with noninvasive studies.  Patient advised to contact her office if his hand gets cold, numb or discolored.  2. Tobacco  abuse Patient continues to utilize tobacco daily.  Advised to stop as this is making many of his comorbidities worse.  3. Complication of vascular dialysis catheter, unspecified complication, initial encounter Currently the patient is maintained via PermCath.  He recently had a dialysis access related right upper extremity due to infection patient also has a thrombosed left upper extremity access.  Discussed different options for placement with the patient.  At this time the  patient wishes to continue to remain utilizing his PermCath.  We will discuss this again with the patient at later date.  4. Acute upper respiratory infection On physical assessment the patient did have some evidence suggestive of possible pneumonia.  Other possible causes for his cough and sputum production could be an exacerbation of heart failure or even a Covid diagnosis.  The patient states that he was recently tested for Covid and was negative.  Today the patient was given antibiotics to treat for possible pneumonia however patient is advised that if he does not see improvement within the next 2 to 3 days he should go to urgent care or the emergency room for evaluation and work-up.  He is also advised to repeat a Covid test.  Patient was hypotensive today however he denied any symptoms.  Upon reassessment blood pressure was 92/51.  Again advised if he develops fever chills nausea or vomiting to seek emergency care.  Patient currently does not have a family doctor that he can turn to for these issues which is why he asked for treatment today.  We will also refer the patient to a primary care physician today. - amoxicillin-clavulanate (AUGMENTIN) 875-125 MG tablet; Take 1 tablet by mouth 2 (two) times daily.  Dispense: 14 tablet; Refill: 0 - doxycycline (VIBRAMYCIN) 100 MG capsule; Take 1 capsule (100 mg total) by mouth 2 (two) times daily.  Dispense: 14 capsule; Refill: 0 - Ambulatory referral to [facility]    Current  Outpatient Medications on File Prior to Visit  Medication Sig Dispense Refill  . amLODipine (NORVASC) 5 MG tablet Take 1 tablet (5 mg total) by mouth daily. 30 tablet 3  . apixaban (ELIQUIS) 5 MG TABS tablet Take 1 tablet (5 mg total) by mouth 2 (two) times daily. 60 tablet 0  . ascorbic acid (VITAMIN C) 500 MG tablet Take 1 tablet (500 mg total) by mouth daily. 90 tablet 0  . atorvastatin (LIPITOR) 80 MG tablet Take 1 tablet (80 mg total) by mouth at bedtime. 30 tablet 1  . b complex-C-folic acid 1 MG capsule Take 1 capsule by mouth daily after supper.     . carvedilol (COREG) 25 MG tablet Take 1 tablet (25 mg total) by mouth 2 (two) times daily with a meal. 60 tablet 1  . gabapentin (NEURONTIN) 100 MG capsule Take 1 capsule (100 mg total) by mouth 3 (three) times daily. 90 capsule 1  . HYDROcodone-acetaminophen (NORCO/VICODIN) 5-325 MG tablet Take 1 tablet by mouth every 6 (six) hours as needed for moderate pain. 30 tablet 0  . hydrOXYzine (ATARAX/VISTARIL) 25 MG tablet Take 25 mg by mouth 3 (three) times daily as needed for anxiety.    . levETIRAcetam (KEPPRA) 500 MG tablet Take 1 tablet (500 mg total) by mouth 2 (two) times daily. 60 tablet 0  . loratadine (CLARITIN) 10 MG tablet Take 1 tablet (10 mg total) by mouth daily. 30 tablet 0  . multivitamin (RENA-VIT) TABS tablet Take 1 tablet by mouth daily. 30 each 1  . neomycin-bacitracin-polymyxin (NEOSPORIN) OINT Apply 1 application topically 2 (two) times daily. 56 g 1  . Nutritional Supplements (FEEDING SUPPLEMENT, NEPRO CARB STEADY,) LIQD Take 237 mLs by mouth 2 (two) times daily between meals. 237 mL 30  . sevelamer carbonate (RENVELA) 800 MG tablet Take 2 tablets (1,600 mg total) by mouth 3 (three) times daily with meals. 180 tablet 1   No current facility-administered medications on file prior to visit.    There are  no Patient Instructions on file for this visit. No follow-ups on file.   Kris Hartmann, NP  This note was completed  with Sales executive.  Any errors are purely unintentional.

## 2020-01-02 ENCOUNTER — Emergency Department: Payer: Medicare Other

## 2020-01-02 ENCOUNTER — Other Ambulatory Visit: Payer: Self-pay

## 2020-01-02 ENCOUNTER — Inpatient Hospital Stay
Admission: EM | Admit: 2020-01-02 | Discharge: 2020-01-21 | DRG: 208 | Disposition: E | Payer: Medicare Other | Attending: Pulmonary Disease | Admitting: Pulmonary Disease

## 2020-01-02 DIAGNOSIS — J189 Pneumonia, unspecified organism: Principal | ICD-10-CM | POA: Diagnosis present

## 2020-01-02 DIAGNOSIS — Z66 Do not resuscitate: Secondary | ICD-10-CM | POA: Diagnosis not present

## 2020-01-02 DIAGNOSIS — I959 Hypotension, unspecified: Secondary | ICD-10-CM | POA: Diagnosis present

## 2020-01-02 DIAGNOSIS — I132 Hypertensive heart and chronic kidney disease with heart failure and with stage 5 chronic kidney disease, or end stage renal disease: Secondary | ICD-10-CM | POA: Diagnosis present

## 2020-01-02 DIAGNOSIS — J9601 Acute respiratory failure with hypoxia: Secondary | ICD-10-CM | POA: Diagnosis present

## 2020-01-02 DIAGNOSIS — R9431 Abnormal electrocardiogram [ECG] [EKG]: Secondary | ICD-10-CM | POA: Diagnosis present

## 2020-01-02 DIAGNOSIS — I214 Non-ST elevation (NSTEMI) myocardial infarction: Secondary | ICD-10-CM | POA: Diagnosis present

## 2020-01-02 DIAGNOSIS — R000 Tachycardia, unspecified: Secondary | ICD-10-CM | POA: Diagnosis present

## 2020-01-02 DIAGNOSIS — Z8249 Family history of ischemic heart disease and other diseases of the circulatory system: Secondary | ICD-10-CM

## 2020-01-02 DIAGNOSIS — Z86718 Personal history of other venous thrombosis and embolism: Secondary | ICD-10-CM

## 2020-01-02 DIAGNOSIS — Z681 Body mass index (BMI) 19 or less, adult: Secondary | ICD-10-CM | POA: Diagnosis not present

## 2020-01-02 DIAGNOSIS — N186 End stage renal disease: Secondary | ICD-10-CM | POA: Diagnosis present

## 2020-01-02 DIAGNOSIS — R531 Weakness: Secondary | ICD-10-CM | POA: Diagnosis present

## 2020-01-02 DIAGNOSIS — D696 Thrombocytopenia, unspecified: Secondary | ICD-10-CM | POA: Diagnosis present

## 2020-01-02 DIAGNOSIS — I248 Other forms of acute ischemic heart disease: Secondary | ICD-10-CM | POA: Diagnosis present

## 2020-01-02 DIAGNOSIS — I739 Peripheral vascular disease, unspecified: Secondary | ICD-10-CM | POA: Diagnosis present

## 2020-01-02 DIAGNOSIS — Z885 Allergy status to narcotic agent status: Secondary | ICD-10-CM

## 2020-01-02 DIAGNOSIS — E872 Acidosis: Secondary | ICD-10-CM | POA: Diagnosis present

## 2020-01-02 DIAGNOSIS — I5043 Acute on chronic combined systolic (congestive) and diastolic (congestive) heart failure: Secondary | ICD-10-CM | POA: Diagnosis present

## 2020-01-02 DIAGNOSIS — Z7901 Long term (current) use of anticoagulants: Secondary | ICD-10-CM

## 2020-01-02 DIAGNOSIS — E43 Unspecified severe protein-calorie malnutrition: Secondary | ICD-10-CM | POA: Diagnosis present

## 2020-01-02 DIAGNOSIS — Z4659 Encounter for fitting and adjustment of other gastrointestinal appliance and device: Secondary | ICD-10-CM

## 2020-01-02 DIAGNOSIS — R188 Other ascites: Secondary | ICD-10-CM | POA: Diagnosis present

## 2020-01-02 DIAGNOSIS — R627 Adult failure to thrive: Secondary | ICD-10-CM | POA: Diagnosis present

## 2020-01-02 DIAGNOSIS — F17210 Nicotine dependence, cigarettes, uncomplicated: Secondary | ICD-10-CM | POA: Diagnosis present

## 2020-01-02 DIAGNOSIS — Z978 Presence of other specified devices: Secondary | ICD-10-CM

## 2020-01-02 DIAGNOSIS — I313 Pericardial effusion (noninflammatory): Secondary | ICD-10-CM | POA: Diagnosis not present

## 2020-01-02 DIAGNOSIS — Z9115 Patient's noncompliance with renal dialysis: Secondary | ICD-10-CM

## 2020-01-02 DIAGNOSIS — K5900 Constipation, unspecified: Secondary | ICD-10-CM | POA: Diagnosis present

## 2020-01-02 DIAGNOSIS — I083 Combined rheumatic disorders of mitral, aortic and tricuspid valves: Secondary | ICD-10-CM | POA: Diagnosis present

## 2020-01-02 DIAGNOSIS — Z20822 Contact with and (suspected) exposure to covid-19: Secondary | ICD-10-CM | POA: Diagnosis present

## 2020-01-02 DIAGNOSIS — R778 Other specified abnormalities of plasma proteins: Secondary | ICD-10-CM

## 2020-01-02 DIAGNOSIS — I468 Cardiac arrest due to other underlying condition: Secondary | ICD-10-CM | POA: Diagnosis not present

## 2020-01-02 DIAGNOSIS — I429 Cardiomyopathy, unspecified: Secondary | ICD-10-CM | POA: Diagnosis present

## 2020-01-02 DIAGNOSIS — Z79899 Other long term (current) drug therapy: Secondary | ICD-10-CM

## 2020-01-02 DIAGNOSIS — I513 Intracardiac thrombosis, not elsewhere classified: Secondary | ICD-10-CM | POA: Diagnosis present

## 2020-01-02 DIAGNOSIS — I5082 Biventricular heart failure: Secondary | ICD-10-CM | POA: Diagnosis present

## 2020-01-02 DIAGNOSIS — N2581 Secondary hyperparathyroidism of renal origin: Secondary | ICD-10-CM | POA: Diagnosis present

## 2020-01-02 DIAGNOSIS — I469 Cardiac arrest, cause unspecified: Secondary | ICD-10-CM

## 2020-01-02 DIAGNOSIS — I255 Ischemic cardiomyopathy: Secondary | ICD-10-CM | POA: Diagnosis not present

## 2020-01-02 DIAGNOSIS — R64 Cachexia: Secondary | ICD-10-CM | POA: Diagnosis present

## 2020-01-02 DIAGNOSIS — I252 Old myocardial infarction: Secondary | ICD-10-CM

## 2020-01-02 DIAGNOSIS — E785 Hyperlipidemia, unspecified: Secondary | ICD-10-CM | POA: Diagnosis present

## 2020-01-02 DIAGNOSIS — Z992 Dependence on renal dialysis: Secondary | ICD-10-CM

## 2020-01-02 DIAGNOSIS — Z882 Allergy status to sulfonamides status: Secondary | ICD-10-CM

## 2020-01-02 DIAGNOSIS — D631 Anemia in chronic kidney disease: Secondary | ICD-10-CM | POA: Diagnosis present

## 2020-01-02 LAB — BLOOD GAS, ARTERIAL
Acid-base deficit: 21.7 mmol/L — ABNORMAL HIGH (ref 0.0–2.0)
Acid-base deficit: 7.1 mmol/L — ABNORMAL HIGH (ref 0.0–2.0)
Allens test (pass/fail): POSITIVE — AB
Bicarbonate: 16.6 mmol/L — ABNORMAL LOW (ref 20.0–28.0)
Bicarbonate: 8.6 mmol/L — ABNORMAL LOW (ref 20.0–28.0)
FIO2: 0.6
FIO2: 100
MECHVT: 350 mL
MECHVT: 380 mL
Mechanical Rate: 20
O2 Saturation: 95.8 %
O2 Saturation: 99.9 %
PEEP: 5 cmH2O
PEEP: 5 cmH2O
Patient temperature: 37
Patient temperature: 37
RATE: 20 resp/min
RATE: 20 resp/min
pCO2 arterial: 28 mmHg — ABNORMAL LOW (ref 32.0–48.0)
pCO2 arterial: 34 mmHg (ref 32.0–48.0)
pH, Arterial: 7.01 — CL (ref 7.350–7.450)
pH, Arterial: 7.38 (ref 7.350–7.450)
pO2, Arterial: 117 mmHg — ABNORMAL HIGH (ref 83.0–108.0)
pO2, Arterial: 285 mmHg — ABNORMAL HIGH (ref 83.0–108.0)

## 2020-01-02 LAB — LIPASE, BLOOD: Lipase: 23 U/L (ref 11–51)

## 2020-01-02 LAB — COMPREHENSIVE METABOLIC PANEL
ALT: 8 U/L (ref 0–44)
AST: 18 U/L (ref 15–41)
Albumin: 2.9 g/dL — ABNORMAL LOW (ref 3.5–5.0)
Alkaline Phosphatase: 102 U/L (ref 38–126)
Anion gap: 20 — ABNORMAL HIGH (ref 5–15)
BUN: 32 mg/dL — ABNORMAL HIGH (ref 6–20)
CO2: 22 mmol/L (ref 22–32)
Calcium: 9 mg/dL (ref 8.9–10.3)
Chloride: 91 mmol/L — ABNORMAL LOW (ref 98–111)
Creatinine, Ser: 5.93 mg/dL — ABNORMAL HIGH (ref 0.61–1.24)
GFR calc Af Amer: 12 mL/min — ABNORMAL LOW (ref >60)
GFR calc non Af Amer: 11 mL/min — ABNORMAL LOW (ref >60)
Glucose, Bld: 95 mg/dL (ref 70–99)
Potassium: 3.6 mmol/L (ref 3.5–5.1)
Sodium: 133 mmol/L — ABNORMAL LOW (ref 135–145)
Total Bilirubin: 3 mg/dL — ABNORMAL HIGH (ref 0.3–1.2)
Total Protein: 6.6 g/dL (ref 6.5–8.1)

## 2020-01-02 LAB — CBC WITH DIFFERENTIAL/PLATELET
Abs Immature Granulocytes: 0.04 10*3/uL (ref 0.00–0.07)
Basophils Absolute: 0 10*3/uL (ref 0.0–0.1)
Basophils Relative: 1 %
Eosinophils Absolute: 0 10*3/uL (ref 0.0–0.5)
Eosinophils Relative: 1 %
HCT: 43.8 % (ref 39.0–52.0)
Hemoglobin: 13.9 g/dL (ref 13.0–17.0)
Immature Granulocytes: 1 %
Lymphocytes Relative: 16 %
Lymphs Abs: 1.2 10*3/uL (ref 0.7–4.0)
MCH: 30.2 pg (ref 26.0–34.0)
MCHC: 31.7 g/dL (ref 30.0–36.0)
MCV: 95.2 fL (ref 80.0–100.0)
Monocytes Absolute: 0.5 10*3/uL (ref 0.1–1.0)
Monocytes Relative: 7 %
Neutro Abs: 5.8 10*3/uL (ref 1.7–7.7)
Neutrophils Relative %: 74 %
Platelets: 88 10*3/uL — ABNORMAL LOW (ref 150–400)
RBC: 4.6 MIL/uL (ref 4.22–5.81)
RDW: 18.6 % — ABNORMAL HIGH (ref 11.5–15.5)
WBC: 7.7 10*3/uL (ref 4.0–10.5)
nRBC: 0.3 % — ABNORMAL HIGH (ref 0.0–0.2)

## 2020-01-02 LAB — TROPONIN I (HIGH SENSITIVITY)
Troponin I (High Sensitivity): 111 ng/L (ref <18)
Troponin I (High Sensitivity): 105 ng/L (ref <18)
Troponin I (High Sensitivity): 144 ng/L (ref <18)

## 2020-01-02 LAB — CBC
HCT: 47.5 % (ref 39.0–52.0)
Hemoglobin: 14.1 g/dL (ref 13.0–17.0)
MCH: 30.5 pg (ref 26.0–34.0)
MCHC: 29.7 g/dL — ABNORMAL LOW (ref 30.0–36.0)
MCV: 102.8 fL — ABNORMAL HIGH (ref 80.0–100.0)
Platelets: 73 10*3/uL — ABNORMAL LOW (ref 150–400)
RBC: 4.62 MIL/uL (ref 4.22–5.81)
RDW: 19 % — ABNORMAL HIGH (ref 11.5–15.5)
WBC: 7.6 10*3/uL (ref 4.0–10.5)
nRBC: 0.4 % — ABNORMAL HIGH (ref 0.0–0.2)

## 2020-01-02 LAB — GLUCOSE, CAPILLARY
Glucose-Capillary: 123 mg/dL — ABNORMAL HIGH (ref 70–99)
Glucose-Capillary: 45 mg/dL — ABNORMAL LOW (ref 70–99)
Glucose-Capillary: 124 mg/dL — ABNORMAL HIGH (ref 70–99)

## 2020-01-02 LAB — CREATININE, SERUM
Creatinine, Ser: 6.01 mg/dL — ABNORMAL HIGH (ref 0.61–1.24)
GFR calc Af Amer: 12 mL/min — ABNORMAL LOW (ref >60)
GFR calc non Af Amer: 10 mL/min — ABNORMAL LOW (ref >60)

## 2020-01-02 LAB — LACTIC ACID, PLASMA
Lactic Acid, Venous: 3.8 mmol/L (ref 0.5–1.9)
Lactic Acid, Venous: 5.4 mmol/L (ref 0.5–1.9)

## 2020-01-02 LAB — MRSA PCR SCREENING: MRSA by PCR: NEGATIVE

## 2020-01-02 LAB — POC SARS CORONAVIRUS 2 AG: SARS Coronavirus 2 Ag: NEGATIVE

## 2020-01-02 MED ORDER — DEXTROSE 50 % IV SOLN
INTRAVENOUS | Status: AC
  Administered 2020-01-02: 50 mL
  Filled 2020-01-02: qty 50

## 2020-01-02 MED ORDER — SODIUM CHLORIDE 0.9 % IV BOLUS
500.0000 mL | Freq: Once | INTRAVENOUS | Status: AC
  Administered 2020-01-02: 500 mL via INTRAVENOUS

## 2020-01-02 MED ORDER — SODIUM CHLORIDE 0.9 % IV SOLN
500.0000 mg | INTRAVENOUS | Status: DC
  Administered 2020-01-03 – 2020-01-04 (×2): 500 mg via INTRAVENOUS
  Filled 2020-01-02 (×3): qty 500

## 2020-01-02 MED ORDER — HEPARIN SODIUM (PORCINE) 5000 UNIT/ML IJ SOLN
5000.0000 [IU] | Freq: Three times a day (TID) | INTRAMUSCULAR | Status: DC
  Administered 2020-01-02 – 2020-01-03 (×2): 5000 [IU] via SUBCUTANEOUS
  Filled 2020-01-02 (×2): qty 1

## 2020-01-02 MED ORDER — MIDAZOLAM HCL 2 MG/2ML IJ SOLN: 2.0000 mg | INTRAMUSCULAR | Status: DC | PRN

## 2020-01-02 MED ORDER — SODIUM CHLORIDE 0.9 % IV SOLN
1.0000 g | INTRAVENOUS | Status: DC
  Administered 2020-01-02 – 2020-01-04 (×3): 1 g via INTRAVENOUS
  Filled 2020-01-02 (×2): qty 1
  Filled 2020-01-02: qty 10

## 2020-01-02 MED ORDER — DEXTROSE 50 % IV SOLN
1.0000 | Freq: Once | INTRAVENOUS | Status: AC
  Administered 2020-01-02: 50 mL via INTRAVENOUS

## 2020-01-02 MED ORDER — MIDAZOLAM HCL 2 MG/2ML IJ SOLN
2.0000 mg | Freq: Once | INTRAMUSCULAR | Status: AC
  Administered 2020-01-02: 2 mg via INTRAVENOUS

## 2020-01-02 MED ORDER — PROPOFOL 1000 MG/100ML IV EMUL
5.0000 ug/kg/min | INTRAVENOUS | Status: DC
  Administered 2020-01-03: 30 ug/kg/min via INTRAVENOUS
  Administered 2020-01-03: 10 ug/kg/min via INTRAVENOUS
  Filled 2020-01-02 (×2): qty 100

## 2020-01-02 MED ORDER — SODIUM BICARBONATE-DEXTROSE 150-5 MEQ/L-% IV SOLN
150.0000 meq | INTRAVENOUS | Status: DC
  Administered 2020-01-02: 150 meq via INTRAVENOUS
  Filled 2020-01-02: qty 1000

## 2020-01-02 MED ORDER — FAMOTIDINE IN NACL 20-0.9 MG/50ML-% IV SOLN
20.0000 mg | Freq: Two times a day (BID) | INTRAVENOUS | Status: DC
  Administered 2020-01-02: 20 mg via INTRAVENOUS
  Filled 2020-01-02: qty 50

## 2020-01-02 MED ORDER — SODIUM CHLORIDE 0.9 % IV SOLN
1.0000 g | Freq: Once | INTRAVENOUS | Status: AC
  Administered 2020-01-02: 1 g via INTRAVENOUS
  Filled 2020-01-02: qty 10

## 2020-01-02 MED ORDER — ONDANSETRON HCL 4 MG/2ML IJ SOLN
4.0000 mg | Freq: Once | INTRAMUSCULAR | Status: AC
  Administered 2020-01-02: 4 mg via INTRAVENOUS
  Filled 2020-01-02: qty 2

## 2020-01-02 MED ORDER — BISACODYL 10 MG RE SUPP: 10.0000 mg | Freq: Every day | RECTAL | Status: DC | PRN

## 2020-01-02 MED ORDER — ONDANSETRON HCL 4 MG/2ML IJ SOLN
INTRAMUSCULAR | Status: AC
  Filled 2020-01-02: qty 2

## 2020-01-02 MED ORDER — NOREPINEPHRINE 4 MG/250ML-% IV SOLN
0.0000 ug/min | INTRAVENOUS | Status: DC
  Administered 2020-01-02: 4 ug/min via INTRAVENOUS
  Administered 2020-01-03: 2 ug/min via INTRAVENOUS
  Administered 2020-01-03: 60 ug/min via INTRAVENOUS
  Administered 2020-01-04: 4 ug/min via INTRAVENOUS
  Filled 2020-01-02 (×3): qty 250

## 2020-01-02 MED ORDER — IPRATROPIUM-ALBUTEROL 0.5-2.5 (3) MG/3ML IN SOLN
3.0000 mL | Freq: Four times a day (QID) | RESPIRATORY_TRACT | Status: DC | PRN
  Filled 2020-01-02: qty 9

## 2020-01-02 MED ORDER — SODIUM CHLORIDE 0.9 % IV SOLN
500.0000 mg | Freq: Once | INTRAVENOUS | Status: AC
  Administered 2020-01-02: 500 mg via INTRAVENOUS
  Filled 2020-01-02: qty 500

## 2020-01-02 MED ORDER — PROPOFOL 1000 MG/100ML IV EMUL
INTRAVENOUS | Status: AC
  Administered 2020-01-02: 10 ug/kg/min via INTRAVENOUS
  Filled 2020-01-02: qty 100

## 2020-01-02 MED ORDER — FENTANYL CITRATE (PF) 100 MCG/2ML IJ SOLN
50.0000 ug | INTRAMUSCULAR | Status: AC | PRN
  Administered 2020-01-03 (×3): 50 ug via INTRAVENOUS
  Filled 2020-01-02 (×3): qty 2

## 2020-01-02 MED ORDER — FENTANYL CITRATE (PF) 100 MCG/2ML IJ SOLN: 50.0000 ug | INTRAMUSCULAR | Status: DC | PRN

## 2020-01-02 MED ORDER — NOREPINEPHRINE 4 MG/250ML-% IV SOLN
INTRAVENOUS | Status: AC
  Filled 2020-01-02: qty 250

## 2020-01-02 NOTE — ED Notes (Addendum)
Note addended, pt did not receive succ

## 2020-01-02 NOTE — ED Notes (Signed)
Attempt to call report, rn not available. 

## 2020-01-02 NOTE — ED Notes (Signed)
Respiratory at bedside @ 6:49pm

## 2020-01-02 NOTE — ED Notes (Signed)
Brett Small, [place] informed of critical trop

## 2020-01-02 NOTE — ED Notes (Signed)
Went to check on pt as monitor showed O2 at 43%. PT in NAD, no SHOB noted, readjusted O2 sensor to right ear lobe and O2 went up to 98%

## 2020-01-02 NOTE — ED Notes (Signed)
Brett Small, [place] informed of critical lactic

## 2020-01-02 NOTE — H&P (Addendum)
Name: Brett Small MRN: [id number] DOB: 05-13-1976    ADMISSION DATE:  01/18/2020 CONSULTATION DATE: 01/06/2020  REFERRING MD : Dr. Kerman Passey   CHIEF COMPLAINT: Generalized Weakness   BRIEF PATIENT DESCRIPTION:  44 yo male with ESRD on HD admitted s/p in hospital PEA arrest with hypotension, acute hypoxic respiratory failure in the setting of severe ascites, possible RLL pneumonia and severe metabolic acidosis requiring mechanical intubation and levophed gtt   SIGNIFICANT EVENTS/STUDIES:  03/13: Pt admitted to ICU mechanically intubated  03/12: CT Abd Pelvis revealed large amount of ascites, increased when compared to the prior CT. Patchy ground-glass opacities at the right lung base. Consider pneumonia if there are consistent clinical findings. Left lung base opacity is more consistent with atelectasis. Small left and minimal right pleural effusions. Small pericardial effusion. Marked renal atrophy consistent with end-stage renal disease. Significant atherosclerotic disease with aortic, superior mesenteric artery and right common iliac artery stents, stable from the prior CT.  HISTORY OF PRESENT ILLNESS:   This is a 44 yo male with a PMH of Hyperparathyroidism, ESRD on HD, Noncompliant with HD due to Social Factors, Chronic Systolic CHF (EF 21-30% on 10/2019), PVD, PAD, Hospitalized from 10/27/2019 to 11/04/2019 with sepsis secondary to pneumonia (complicated by NSTEMI, cardiac arrested x2 with ROSC) Right Radial Vein DVT (on Eliquis), and HTN.  He presented to [facility] ER on 03/13 via EMS from home with generalized weakness and difficulty with ambulation due to fatigue.  He was recently discharged from [facility] on 02/24 following treatment of hyperkalemia after missing HD session, and found to have malfunctioning dialysis catheter requiring new permanent right IJ permcath placement per vascular surgery.  During current ER presentation pt alert/oriented and reported compliance with HD sessions.   Lab results revealed Na+ 133, chloride 91, glucose 95, BUN 32, creatinine 5.93, anion gap 20, albumin 2.9, troponin 111, lactic acid 3.8, platelets 88, and COVID-19 negative. Vital signs initially stable, however he later developed hypotension sbp 50-60's and subsequently PEA arrested.  Therefore, ACLS protocol initiated for 7 minutes prior to [place], and pt mechanically intubated. Pt noted to have purposeful movement post cardiac arrest. He remained hypotensive requiring levophed gtt.  ABG revealed severe metabolic acidosis.  Pt with severe abdominal distension, bedside ultrasound performed by ER physician which confirmed significant ascites. Therefore, bedside paracentesis performed with removal of 2L of fluid. PCCM team contacted for ICU admission.    PAST MEDICAL HISTORY :   has a past medical history of ESRD (end stage renal disease) ([place]), Hypertension, PAD (peripheral artery disease) ([place]), Peripheral vascular disease ([place]), Renal disorder, and Secondary hyperparathyroidism of renal origin ([place]).  has a past surgical history that includes Aorta - femoral artery bypass graft; AV fistula placement (Left, 12/26/2018); A/V SHUNT INTERVENTION (Left, 01/19/2019); DIALYSIS/PERMA CATHETER REMOVAL (N/A, 02/06/2019); A/V SHUNT INTERVENTION (Left, 03/02/2019); LEFT HEART CATH AND CORONARY ANGIOGRAPHY (Right, 07/10/2019); PERIPHERAL VASCULAR THROMBECTOMY (Left, 07/28/2019); Upper Extremity Angiography (Left, 08/13/2019); AV fistula placement (Right, 08/12/2019); TEMPORARY DIALYSIS CATHETER (N/A, 08/25/2019); Removal of a dialysis catheter (Right, 08/26/2019); Esophagogastroduodenoscopy (N/A, 10/07/2019); UPPER EXTREMITY INTERVENTION (Right, 11/02/2019); TEMPORARY DIALYSIS CATHETER (N/A, 12/15/2019); and DIALYSIS/PERMA CATHETER INSERTION (Right, 12/16/2019). Prior to Admission medications   Medication Sig Start Date End Date Taking? Authorizing Provider  amLODipine (NORVASC) 5 MG tablet Take 1 tablet (5 mg total) by mouth daily.  08/28/19  Yes Danford, Suann Larry, MD  amoxicillin-clavulanate (AUGMENTIN) 875-125 MG tablet Take 1 tablet by mouth 2 (two) times daily. 12/25/19  Yes Kris Hartmann, NP  apixaban (  ELIQUIS) 5 MG TABS tablet Take 1 tablet (5 mg total) by mouth 2 (two) times daily. 12/16/19 01/15/20 Yes Lorella Nimrod, MD  carvedilol (COREG) 25 MG tablet Take 1 tablet (25 mg total) by mouth 2 (two) times daily with a meal. 01/26/19  Yes Clapacs, Madie Reno, MD   Allergies  Allergen Reactions  . Sulfa Antibiotics Hives and Nausea And Vomiting  . Codeine Nausea Only    FAMILY HISTORY:  family history includes Clotting disorder in his father; Diabetes in an other family member; Hypertension in an other family member. SOCIAL HISTORY:  reports that he has been smoking cigarettes. He has a 15.00 pack-year smoking history. He has never used smokeless tobacco. He reports previous alcohol use. He reports current drug use. Frequency: 1.00 time per week. Drug: Marijuana.  REVIEW OF SYSTEMS:   Unable to assess pt mechanically intubated   SUBJECTIVE:  Unable to assess pt mechanically intubated   VITAL SIGNS: Temp:  [97.7 F (36.5 C)] 97.7 F (36.5 C) (03/13 1240) Pulse Rate:  [29-114] 29 (03/13 2026) Resp:  [19-33] 25 (03/13 2030) BP: (50-134)/(25-111) 127/107 (03/13 2030) SpO2:  [25 %-100 %] 85 % (03/13 2026) FiO2 (%):  [100 %] 100 % (03/13 1952) Weight:  [45.4 kg] 45.4 kg (03/13 1232)  PHYSICAL EXAMINATION: General: acutely ill cachetic appearing male, NAD mechanically intubated  Neuro: sedated, not following commands, PERRL HEENT: supple, no JVD  Cardiovascular: nsr, rrr, no R/G, right IJ permcath  Lungs: rhonchi throughout, even, non labored  Abdomen: +BS x4, soft, non distended Musculoskeletal: severely cachetic, no edema  Skin: intact no rashes or lesions present   Recent Labs  Lab 01/13/2020 1358 01/07/2020 1916  NA 133*  --   K 3.6  --   CL 91*  --   CO2 22  --   BUN 32*  --   CREATININE 5.93* 6.01*   GLUCOSE 95  --    Recent Labs  Lab 01/12/2020 1358 12/25/2019 1916  HGB 13.9 14.1  HCT 43.8 47.5  WBC 7.7 7.6  PLT 88* 73*   CT ABDOMEN PELVIS WO CONTRAST  Result Date: 01/15/2020 CLINICAL DATA:  Per RN note in pt chart "Pt arrives via [facility] from home for generalized weakness since he was "last discharged". Pt d/c from here on 02/24. Pt reports he has been having trouble walking with fatigue but no dizziness. Reports he has been going to dialysis regularly and was supposed to go today as well. Reports pain 7/10 in both legs, NAD noted, A&Ox4. Pt reports nausea and diarrhea" EXAM: CT ABDOMEN AND PELVIS WITHOUT CONTRAST TECHNIQUE: Multidetector CT imaging of the abdomen and pelvis was performed following the standard protocol without IV contrast. COMPARISON:  07/20/2019 FINDINGS: Lower chest: Small left and minimal right pleural effusions. Small pericardial effusion. There is hazy ground-glass opacity at the right lung base. More confluent opacity is noted in the left lower lobe and inferolateral left upper lobe lingula consistent with atelectasis. Hepatobiliary: Liver normal in size and attenuation. No masses. Some increased attenuation in the gallbladder which could reflect small stones. No convincing acute cholecystitis or bile duct dilation. Pancreas: Unremarkable. No pancreatic ductal dilatation or surrounding inflammatory changes. Spleen: Normal in size without focal abnormality. Adrenals/Urinary Tract: No adrenal masses. Marked bilateral renal atrophy. No hydronephrosis. Ureters not well visualized. No ureteral dilation. Bladder is decompressed. Stomach/Bowel: No bowel dilation to suggest obstruction. No wall thickening or convincing inflammation. Normal stomach. Vascular/Lymphatic: Superior mesenteric artery, aortic and right common iliac artery stents.  No discrete enlarged lymph nodes. Reproductive: Unremarkable. Other: Large amount of ascites distends the abdomen. Musculoskeletal: Chronic  bilateral pars defects at L5-S1 with a grade 1 anterolisthesis. No acute fracture. No osteoblastic or osteolytic lesions. IMPRESSION: 1. Large amount of ascites, increased when compared to the prior CT. 2. Patchy ground-glass opacities at the right lung base. Consider pneumonia if there are consistent clinical findings. Left lung base opacity is more consistent with atelectasis. 3. Small left and minimal right pleural effusions. Small pericardial effusion. 4. Marked renal atrophy consistent with end-stage renal disease. 5. Significant atherosclerotic disease with aortic, superior mesenteric artery and right common iliac artery stents, stable from the prior CT. Electronically Signed   By: Lajean Manes M.D.   On: 01/19/2020 14:57   DG Chest 1 View  Result Date: 01/12/2020 CLINICAL DATA:  44 year old male status post intubation. EXAM: CHEST  1 VIEW COMPARISON:  Earlier radiograph dated 01/17/2020. FINDINGS: Endotracheal tube with tip approximately 6.7 cm above the carina. Enteric tube extends below the diaphragm with tip in the body of the stomach. Dialysis catheter in similar position. No significant interval change in the appearance of the lungs or cardiomediastinal silhouette since the earlier radiograph. IMPRESSION: Interval placement of an endotracheal tube with tip above the carina and enteric tube with tip projecting over the body of the stomach. Electronically Signed   By: Anner Crete M.D.   On: 12/26/2019 19:45   DG Chest 1 View  Result Date: 12/24/2019 CLINICAL DATA:  Weakness. Recent pneumonia. EXAM: CHEST  1 VIEW COMPARISON:  12/15/2019 prior radiographs FINDINGS: Cardiomegaly and LEFT IJ central venous catheter with tip overlying the UPPER RIGHT atrium again noted. Small LEFT pleural effusion and LEFT basilar atelectasis/opacity again noted. A LEFT axillary vascular stent is present. Mild pulmonary vascular congestion is present. No pneumothorax or acute bony. IMPRESSION: Unchanged chest  radiograph with cardiomegaly, mild pulmonary vascular congestion and small LEFT pleural effusion/LEFT basilar opacity. Electronically Signed   By: Margarette Canada M.D.   On: 12/22/2019 13:18    ASSESSMENT / PLAN:  Acute hypoxic respiratory failure secondary to severe metabolic acidosis, possible LLL pneumonia, and severe ascites (s/p paracentesis 01/03/2020 with 2L fluid removal) Mechanical Intubation post cardiac arrest Full vent support for now-vent settings reviewed and established  SBT's once all parameters met  VAP bundle implemented  Prn bronchodilator therapy  NOT a candidate for hypothermic protocol pt with purposeful movement post cardiac arrest  PEA arrest and hypotension likely secondary to severe metabolic acidosis Elevated troponin's secondary to demand ischemia vs NSTEMI  Hx: HTN, DVT, PVD, and PAD Continuous telemetry monitoring  Trend troponin's  Prn levophed gtt to maintain map >65 Hold outpatient antihypertensives for now    ESRD on HD  Severe metabolic acidosis  Lactic acidosis Trend BMP and lactic acid  Replace electrolytes as indicated  Monitor UOP Avoid nephrotoxic medications  Will start sodium bicarb gtt '@75'  ml/hr  Nephrology consulted appreciate input  Possible RLL pneumonia  Trend WBC and monitor fever curve  Trend PCT  Follow cultures  Continue azithromycin and ceftriaxone   Chronic thrombocytopenia  Trend CBC  Transfuse for hgb <7 and/or active bleeding   Mechanical intubation pain/discomfort  Maintain RASS goal -1 to -2 Propofol gtt and prn versed/fentanly to maintain RASS goal  WUA daily   Best Practice: VTE px: subq heparin  SUP px: iv pepcid Diet: keep NPO   [name], [place] Pager [phone] (please enter 7 digits) PCCM Consult Pager [phone] (please  enter 7 digits)

## 2020-01-02 NOTE — ED Notes (Signed)
Difficult to get a good pox on pt due to skin color. See blood gas for po2

## 2020-01-02 NOTE — Progress Notes (Signed)
A consult was placed to IV Team ;  R arm assessed thoroughly w ultrasound;  Attempted x 1 near right wrist without ultrasound;  Unsuccessful;  Will let RN know;

## 2020-01-02 NOTE — ED Notes (Signed)
Calcium chloride 1 amp given at 6:53pm

## 2020-01-02 NOTE — ED Provider Notes (Signed)
-----------------------------------------   7:18 PM on 12/23/2019 -----------------------------------------  While patient was using the bedpan patient suffered a cardiac arrest.  Patient lost pulse for approximately 10 minutes, CPR was started and directed by myself.  Patient was intubated by myself.  After several rounds of CPR multiple rounds of epinephrine and a dose of calcium we were able to regain a spontaneous pulse.  Prior to that the patient remained in PEA arrest.  Once the patient regained a pulse there was signs of purposeful movement such as grabbing close to the tube.  Patient has an extremely tight abdomen.  Bedside ultrasound confirms significant ascites.  No tamponade on bedside ultrasound.  Once the patient was stabilized I performed a bedside paracentesis to drain 2 L.  Patient now has a much softer abdomen.  We will place the patient on propofol for sedation.  Patient will require ICU admission.  Cardiopulmonary Resuscitation (CPR) Procedure Note Directed/Performed by: Harvest Dark I personally directed ancillary staff and/or performed CPR in an effort to regain return of spontaneous circulation and to maintain cardiac, neuro and systemic perfusion.    INTUBATION Performed by: Harvest Dark  Required items: required blood products, implants, devices, and special equipment available Patient identity confirmed: provided demographic data and hospital-assigned identification number Time out: Immediately prior to procedure a "time out" was called to verify the correct patient, procedure, equipment, support staff and site/side marked as required.  Indications: Cardiac arrest  Intubation method: S4 Glidescope Laryngoscopy   Preoxygenation: 100% BVM  No medications required   Tube Size: 7.0 cuffed  Post-procedure assessment: chest rise and ETCO2 monitor Breath sounds: equal and absent over the epigastrium Tube secured with: ETT holder  Patient tolerated the  procedure well with no immediate complications.  .Paracentesis  Date/Time: 01/04/2020 7:22 PM Performed by: Harvest Dark, MD Authorized by: Harvest Dark, MD   Consent:    Consent obtained:  Emergent situation Pre-procedure details:    Procedure purpose:  Therapeutic Anesthesia (see MAR for exact dosages):    Anesthesia method:  Local infiltration   Local anesthetic:  Lidocaine 1% w/o epi Procedure details:    Needle gauge:  18   Ultrasound guidance: yes     Puncture site:  R lower quadrant   Fluid removed amount:  2L   Fluid appearance:  Amber   Dressing:  Adhesive bandage Post-procedure details:    Patient tolerance of procedure:  Tolerated well, no immediate complications   CRITICAL CARE Performed by: Harvest Dark   Total critical care time: 30 minutes  Critical care time was exclusive of separately billable procedures and treating other patients.  Critical care was necessary to treat or prevent imminent or life-threatening deterioration.  Critical care was time spent personally by me on the following activities: development of treatment plan with patient and/or surrogate as well as nursing, discussions with consultants, evaluation of patient's response to treatment, examination of patient, obtaining history from patient or surrogate, ordering and performing treatments and interventions, ordering and review of laboratory studies, ordering and review of radiographic studies, pulse oximetry and re-evaluation of patient's condition.       Harvest Dark, MD 01/14/2020 [phone]

## 2020-01-02 NOTE — ED Notes (Signed)
Dr. Kerman Passey at bedside performing parecentesis

## 2020-01-02 NOTE — ED Provider Notes (Signed)
[facility] Department Provider Note  ____________________________________________  Time seen: Approximately 12:31 PM  I have reviewed the triage vital signs and the nursing notes.   HISTORY  Chief Complaint Weakness    HPI Brett Small is a 44 y.o. male who presents the emergency department via EMS from home for multiple complaints.  Primary complaint is generalized weakness.  Patient states that he has been weak for the past 2 to 3 weeks.  He states that this is been ongoing since he was discharged from the hospital at the end of February.  Patient states that he has had cough, shortness of breath, nausea, emesis, inability to eat or drink, intermittent diarrhea and constipation as well as abdominal distention.  Patient is in end-stage renal disease, is on dialysis.  Patient states that he was so weak today he was unable to have dialysis.  Patient with a history of DVTs, peripheral vascular disease, CHF, NSTEMI, anemia, GERD, hyperlipidemia.  Patient has been seen multiple times in the past several months.  The patient did go twice at the beginning of January following a diagnosis of sepsis.  Patient has also been seen in this department after his dialysis shunt failed.  At that time, dialysis shunt was replaced.  Patient states that he has been going to his dialysis appointments as scheduled.  He states that he has been to his dialysis on Tuesday and Thursday of this week.  He denies any fevers or chills.  When he saw vascular surgery a week ago he was diagnosed with pneumonia, was prescribed 2 antibiotics for same.  Patient states that the pharmacy was supposed to deliver his antibiotics but they have not.  Patient has not been taking any antibiotics for his cough and shortness of breath.  Cough and shortness of breath started roughly 2 weeks ago.  Patient states that he has been tested for Covid recently with a negative result.  However testing was roughly 3 to  4 weeks ago.  Patient states that he has been exceptionally nauseated, has had periods of emesis.  Patient states that he is unable to eat and drink at this time.  Patient states that he will have periods of constipation, followed by periods of diarrhea.  Patient has not seen any blood in his emesis or diarrhea.  Patient states that he has had abdominal distention but denies any frank abdominal pain.  Patient is end-stage renal disease on dialysis but states that he does still make some urine.  Patient denies any dysuria or hematuria.  Patient has not been taking any medications for his complaints.         Past Medical History:  Diagnosis Date  . ESRD (end stage renal disease) ([place])   . Hypertension   . PAD (peripheral artery disease) (HCC)    Required aortofemoral stent-which had closed and had to redo the procedure and ischemia of limb.  . Peripheral vascular disease ([place])   . Renal disorder   . Secondary hyperparathyroidism of renal origin [facility])     Patient Active Problem List   Diagnosis Date Noted  . Pressure injury of skin 12/16/2019  . Non-compliance with renal dialysis ([place]) 12/15/2019  . History of DVT R forearm radial vein 10/28/19 12/15/2019  . Complication of vascular dialysis catheter 12/15/2019  . History of cardiac arrest 10/2019 12/15/2019  . Sepsis due to pneumonia ([place]) 10/27/2019  . Generalized weakness 10/27/2019  . Cardiac arrest ([place]) 10/27/2019  . Nausea & vomiting 10/04/2019  .  Arm DVT (deep venous thromboembolism), acute, right ([place]) 10/04/2019  . Hypotension 10/04/2019  . Bronchitis 10/03/2019  . Tobacco abuse 08/24/2019  . Marijuana use 08/24/2019  . PVD (peripheral vascular disease) ([place]) 08/24/2019  . Wound dehiscence, surgical, sequela 08/24/2019  . Chronic systolic CHF (congestive heart failure) ([place]) 08/24/2019  . Volume overload 08/24/2019  . Hyperkalemia 08/24/2019  . Seizure ([place])   . Malnutrition of moderate degree 08/05/2019  . Acute CHF  (congestive heart failure) ([place]) 08/04/2019  . Pulmonary edema 07/27/2019  . Pleural effusion 07/20/2019  . NSTEMI (non-ST elevated myocardial infarction) ([place]) 07/09/2019  . Pneumonia 07/02/2019  . Acute liver failure 05/19/2019  . Hematemesis 05/19/2019  . Hepatitis   . Thrombocytopenia ([place])   . Coagulopathy ([place])   . Sepsis ([place]) 02/28/2019  . Lobar pneumonia ([place]) 02/28/2019  . Acute respiratory failure ([place]) 02/04/2019  . Acute respiratory failure with hypoxemia ([place]) 01/27/2019  . Depression 01/07/2019  . MDD (major depressive disorder), single episode, severe , no psychosis ([place])   . Homelessness   . Acute respiratory failure with hypoxia ([place]) 12/25/2018  . End stage renal disease on dialysis ([place]) 12/25/2018  . Hypertension 12/25/2018  . Renal osteodystrophy 12/25/2018  . Anemia 10/01/2018  . GERD (gastroesophageal reflux disease) 10/01/2018  . Malnutrition ([place]) 09/30/2018  . ESRD on hemodialysis ([place]) 09/15/2018  . HFrEF (heart failure with reduced ejection fraction) ([place]) 09/15/2018  . HLD (hyperlipidemia) 09/15/2018  . Secondary hyperparathyroidism ([place]) 08/18/2018  . Hx of fasciotomy 07/09/2018    Past Surgical History:  Procedure Laterality Date  . A/V SHUNT INTERVENTION Left 01/19/2019   Procedure: LEFT UPPER EXTREMITY A/V SHUNTOGRAM / UPPER EXTREMITY ANGIOGRAM;  Surgeon: Algernon Huxley, MD;  Location: [facility];  Service: Cardiovascular;  Laterality: Left;  . A/V SHUNT INTERVENTION Left 03/02/2019   Procedure: A/V SHUNT INTERVENTION;  Surgeon: Algernon Huxley, MD;  Location: [facility];  Service: Cardiovascular;  Laterality: Left;  . AORTA - FEMORAL ARTERY BYPASS GRAFT    . AV FISTULA PLACEMENT Left 12/26/2018   Procedure: INSERTION OF GORE STRETCH VASCULAR 4-7MM X  45CM IN LEFT UPPER ARM;  Surgeon: Marty Heck, MD;  Location: [facility];  Service: Vascular;  Laterality: Left;  . AV FISTULA PLACEMENT Right 08/12/2019   Procedure: INSERTION OF  ARTERIOVENOUS (AV) GORE-TEX GRAFT ARM;  Surgeon: Algernon Huxley, MD;  Location: [facility];  Service: Vascular;  Laterality: Right;  . DIALYSIS/PERMA CATHETER INSERTION Right 12/16/2019   Procedure: DIALYSIS/PERMA CATHETER EXCHANGE;  Surgeon: Katha Cabal, MD;  Location: [facility];  Service: Cardiovascular;  Laterality: Right;  . DIALYSIS/PERMA CATHETER REMOVAL N/A 02/06/2019   Procedure: DIALYSIS/PERMA CATHETER REMOVAL;  Surgeon: Algernon Huxley, MD;  Location: [facility];  Service: Cardiovascular;  Laterality: N/A;  . ESOPHAGOGASTRODUODENOSCOPY N/A 10/07/2019   Procedure: ESOPHAGOGASTRODUODENOSCOPY (EGD);  Surgeon: Lin Landsman, MD;  Location[facility];  Service: Gastroenterology;  Laterality: N/A;  . LEFT HEART CATH AND CORONARY ANGIOGRAPHY Right 07/10/2019   Procedure: LEFT HEART CATH AND CORONARY ANGIOGRAPHY;  Surgeon: Dionisio David, MD;  Location: [facility];  Service: Cardiovascular;  Laterality: Right;  . PERIPHERAL VASCULAR THROMBECTOMY Left 07/28/2019   Procedure: Left Upper Extremity Dialysis Access [place];  Surgeon: Katha Cabal, MD;  Location: [facility];  Service: Cardiovascular;  Laterality: Left;  . REMOVAL OF A DIALYSIS CATHETER Right 08/26/2019   Procedure: REMOVAL OF A DIALYSIS CATHETER;  Surgeon: Algernon Huxley,  MD;  Location: [facility];  Service: Vascular;  Laterality: Right;  . TEMPORARY DIALYSIS CATHETER N/A 08/25/2019   Procedure: TEMPORARY DIALYSIS CATHETER;  Surgeon: Katha Cabal, MD;  Location: [facility];  Service: Cardiovascular;  Laterality: N/A;  . TEMPORARY DIALYSIS CATHETER N/A 12/15/2019   Procedure: TEMPORARY DIALYSIS CATHETER;  Surgeon: Katha Cabal, MD;  Location: [facility];  Service: Cardiovascular;  Laterality: N/A;  . UPPER EXTREMITY ANGIOGRAPHY Left 08/13/2019   Procedure: UPPER EXTREMITY ANGIOGRAPHy;  Surgeon: Algernon Huxley, MD;  Location: [facility];  Service:  Cardiovascular;  Laterality: Left;  . UPPER EXTREMITY INTERVENTION Right 11/02/2019   Procedure: UPPER EXTREMITY INTERVENTION;  Surgeon: Algernon Huxley, MD;  Location: [facility];  Service: Cardiovascular;  Laterality: Right;    Prior to Admission medications   Medication Sig Start Date End Date Taking? Authorizing Provider  amLODipine (NORVASC) 5 MG tablet Take 1 tablet (5 mg total) by mouth daily. 08/28/19  Yes Danford, Suann Larry, MD  amoxicillin-clavulanate (AUGMENTIN) 875-125 MG tablet Take 1 tablet by mouth 2 (two) times daily. 12/25/19  Yes Kris Hartmann, NP  apixaban (ELIQUIS) 5 MG TABS tablet Take 1 tablet (5 mg total) by mouth 2 (two) times daily. 12/16/19 01/15/20 Yes Lorella Nimrod, MD  carvedilol (COREG) 25 MG tablet Take 1 tablet (25 mg total) by mouth 2 (two) times daily with a meal. 01/26/19  Yes Clapacs, Madie Reno, MD    Allergies Sulfa antibiotics and Codeine  Family History  Problem Relation Age of Onset  . Hypertension Other   . Diabetes Other   . Clotting disorder Father     Social History Social History   Tobacco Use  . Smoking status: Current Every Day Smoker    Packs/day: 0.50    Years: 30.00    Pack years: 15.00    Types: Cigarettes    Last attempt to quit: 06/26/2018    Years since quitting: 1.5  . Smokeless tobacco: Never Used  . Tobacco comment: smoked for 30 years   Substance Use Topics  . Alcohol use: Not Currently  . Drug use: Yes    Frequency: 1.0 times per week    Types: Marijuana     Review of Systems  Constitutional: No fever/chills.  Generalized weakness. Eyes: No visual changes. No discharge ENT: No upper respiratory complaints. Cardiovascular: no chest pain. Respiratory: Positive for cough and shortness of breath. Gastrointestinal: No abdominal pain.  Positive for abdominal distention positive for nausea, vomiting with decreased oral intake.  Patient states that he has periods of constipation, followed by periods of  diarrhea. Genitourinary: End-stage renal disease, reports she still makes urine.  Negative for dysuria. No hematuria Musculoskeletal: Negative for musculoskeletal pain. Skin: Negative for rash, abrasions, lacerations, ecchymosis. Neurological: Negative for headaches, focal weakness or numbness. 10-point ROS otherwise negative.  ____________________________________________   PHYSICAL EXAM:  VITAL SIGNS: ED Triage Vitals  Enc Vitals Group     BP      Pulse      Resp      Temp      Temp src      SpO2      Weight      Height      Head Circumference      Peak Flow      Pain Score      Pain Loc      Pain Edu?      Excl. in [place]?  Constitutional: Alert and oriented. Moderately ill appearing and in no acute distress. Eyes: Conjunctivae are normal. PERRL. EOMI. Head: Atraumatic. ENT:      Ears:       Nose: No congestion/rhinnorhea.      Mouth/Throat: Mucous membranes are slightly dry.  No gross oropharyngeal erythema or edema noted. Neck: No stridor.  Hematological/Lymphatic/Immunilogical: No cervical lymphadenopathy. Cardiovascular: Normal rate, regular rhythm. Normal S1 and S2.  Good peripheral circulation. Respiratory: Normal respiratory effort without tachypnea or retractions. Lungs CTAB. Good air entry to the bases with no decreased or absent breath sounds. Gastrointestinal: Decreased but present bowel sounds 4 quadrants. Soft and nontender to palpation. No guarding or rigidity. No palpable masses. Positive distention. No CVA tenderness. Musculoskeletal: Full range of motion to all extremities. No gross deformities appreciated. Neurologic:  Normal speech and language. No gross focal neurologic deficits are appreciated.  Skin:  Skin is warm, dry and intact. No rash noted. Psychiatric: Mood and affect are normal. Speech and behavior are normal. Patient exhibits appropriate insight and judgement.   ____________________________________________   LABS (all labs ordered  are listed, but only abnormal results are displayed)  Labs Reviewed  COMPREHENSIVE METABOLIC PANEL - Abnormal; Notable for the following components:      Result Value   Sodium 133 (*)    Chloride 91 (*)    BUN 32 (*)    Creatinine, Ser 5.93 (*)    Albumin 2.9 (*)    Total Bilirubin 3.0 (*)    GFR calc non Af Amer 11 (*)    GFR calc Af Amer 12 (*)    Anion gap 20 (*)    All other components within normal limits  LACTIC ACID, PLASMA - Abnormal; Notable for the following components:   Lactic Acid, Venous 3.8 (*)    All other components within normal limits  LACTIC ACID, PLASMA - Abnormal; Notable for the following components:   Lactic Acid, Venous 5.4 (*)    All other components within normal limits  CBC WITH DIFFERENTIAL/PLATELET - Abnormal; Notable for the following components:   RDW 18.6 (*)    Platelets 88 (*)    nRBC 0.3 (*)    All other components within normal limits  BLOOD GAS, ARTERIAL - Abnormal; Notable for the following components:   pH, Arterial 7.01 (*)    pO2, Arterial 117 (*)    Bicarbonate 8.6 (*)    Acid-base deficit 21.7 (*)    Allens test (pass/fail) POSITIVE (*)    All other components within normal limits  TROPONIN I (HIGH SENSITIVITY) - Abnormal; Notable for the following components:   Troponin I (High Sensitivity) 111 (*)    All other components within normal limits  TROPONIN I (HIGH SENSITIVITY) - Abnormal; Notable for the following components:   Troponin I (High Sensitivity) 105 (*)    All other components within normal limits  SARS CORONAVIRUS 2 (TAT 6-24 HRS)  LIPASE, BLOOD  URINALYSIS, COMPLETE (UACMP) WITH MICROSCOPIC   ____________________________________________  EKG   ____________________________________________  RADIOLOGY I personally viewed and evaluated these images as part of my medical decision making, as well as reviewing the written report by the radiologist.  CT ABDOMEN PELVIS WO CONTRAST  Result Date: 01/10/2020 CLINICAL  DATA:  Per RN note in pt chart "Pt arrives via [facility] from home for generalized weakness since he was "last discharged". Pt d/c from here on 02/24. Pt reports he has been having trouble walking with fatigue but no dizziness. Reports he has been going  to dialysis regularly and was supposed to go today as well. Reports pain 7/10 in both legs, NAD noted, A&Ox4. Pt reports nausea and diarrhea" EXAM: CT ABDOMEN AND PELVIS WITHOUT CONTRAST TECHNIQUE: Multidetector CT imaging of the abdomen and pelvis was performed following the standard protocol without IV contrast. COMPARISON:  07/20/2019 FINDINGS: Lower chest: Small left and minimal right pleural effusions. Small pericardial effusion. There is hazy ground-glass opacity at the right lung base. More confluent opacity is noted in the left lower lobe and inferolateral left upper lobe lingula consistent with atelectasis. Hepatobiliary: Liver normal in size and attenuation. No masses. Some increased attenuation in the gallbladder which could reflect small stones. No convincing acute cholecystitis or bile duct dilation. Pancreas: Unremarkable. No pancreatic ductal dilatation or surrounding inflammatory changes. Spleen: Normal in size without focal abnormality. Adrenals/Urinary Tract: No adrenal masses. Marked bilateral renal atrophy. No hydronephrosis. Ureters not well visualized. No ureteral dilation. Bladder is decompressed. Stomach/Bowel: No bowel dilation to suggest obstruction. No wall thickening or convincing inflammation. Normal stomach. Vascular/Lymphatic: Superior mesenteric artery, aortic and right common iliac artery stents. No discrete enlarged lymph nodes. Reproductive: Unremarkable. Other: Large amount of ascites distends the abdomen. Musculoskeletal: Chronic bilateral pars defects at L5-S1 with a grade 1 anterolisthesis. No acute fracture. No osteoblastic or osteolytic lesions. IMPRESSION: 1. Large amount of ascites, increased when compared to the prior CT. 2.  Patchy ground-glass opacities at the right lung base. Consider pneumonia if there are consistent clinical findings. Left lung base opacity is more consistent with atelectasis. 3. Small left and minimal right pleural effusions. Small pericardial effusion. 4. Marked renal atrophy consistent with end-stage renal disease. 5. Significant atherosclerotic disease with aortic, superior mesenteric artery and right common iliac artery stents, stable from the prior CT. Electronically Signed   By: Lajean Manes M.D.   On: 12/26/2019 14:57   DG Chest 1 View  Result Date: 01/10/2020 CLINICAL DATA:  Weakness. Recent pneumonia. EXAM: CHEST  1 VIEW COMPARISON:  12/15/2019 prior radiographs FINDINGS: Cardiomegaly and LEFT IJ central venous catheter with tip overlying the UPPER RIGHT atrium again noted. Small LEFT pleural effusion and LEFT basilar atelectasis/opacity again noted. A LEFT axillary vascular stent is present. Mild pulmonary vascular congestion is present. No pneumothorax or acute bony. IMPRESSION: Unchanged chest radiograph with cardiomegaly, mild pulmonary vascular congestion and small LEFT pleural effusion/LEFT basilar opacity. Electronically Signed   By: Margarette Canada M.D.   On: 01/14/2020 13:18    ____________________________________________    PROCEDURES  Procedure(s) performed:    .Critical Care Performed by: Darletta Moll, PA-C Authorized by: Darletta Moll, PA-C   Critical care provider statement:    Critical care time (minutes):  45   Critical care was necessary to treat or prevent imminent or life-threatening deterioration of the following conditions:  Cardiac failure, sepsis and metabolic crisis   Critical care was time spent personally by me on the following activities:  Obtaining history from patient or surrogate, examination of patient, evaluation of patient's response to treatment, ordering and performing treatments and interventions, ordering and review of laboratory  studies, ordering and review of radiographic studies, pulse oximetry, re-evaluation of patient's condition and review of old charts      Medications  ondansetron (ZOFRAN) 4 MG/2ML injection (has no administration in time range)  norepinephrine (LEVOPHED) 4-5 MG/250ML-% infusion SOLN (has no administration in time range)  norepinephrine (LEVOPHED) 4mg  in 26mL premix infusion (10 mcg/min Intravenous Rate/Dose Change 12/27/2019 1921)  propofol (DIPRIVAN) 1000 MG/100ML infusion (10 mcg/kg/min  45.4 kg Intravenous New Bag/Given 01/16/2020 1922)  sodium chloride 0.9 % bolus 500 mL (0 mLs Intravenous Stopped 01/18/2020 1753)  ondansetron (ZOFRAN) injection 4 mg (4 mg Intravenous Given 12/27/2019 1419)  cefTRIAXone (ROCEPHIN) 1 g in sodium chloride 0.9 % 100 mL IVPB (0 g Intravenous Stopped 12/29/2019 1809)  sodium chloride 0.9 % bolus 500 mL (0 mLs Intravenous Stopped 12/21/2019 1934)  azithromycin (ZITHROMAX) 500 mg in sodium chloride 0.9 % 250 mL IVPB (500 mg Intravenous New Bag/Given 12/22/2019 1812)     ____________________________________________   INITIAL IMPRESSION / ASSESSMENT AND PLAN / ED COURSE  Pertinent labs & imaging results that were available during my care of the patient were reviewed by me and considered in my medical decision making (see chart for details).  Review of the [place] CSRS was performed in accordance of the [place] prior to dispensing any controlled drugs.           Patient's diagnosis is consistent with weakness, community-acquired ammonia, ascites, elevated troponin, end-stage renal disease on dialysis.  Patient states that he has been short of breath, coughing, intermittent diarrhea and constipation as well as nausea and emesis.  Patient states that he has been progressively weakening to the point that he did not feel like he was able to go to his dialysis today.  Patient denies any fevers or chills, nasal congestion, sore throat.  Patient had been treated for pneumonia in January,  had coughing and increasing shortness of breath starting over the past 2 weeks.  Patient was diagnosed with community-acquired pneumonia and prescribed antibiotics but the antibiotics were never filled.  Patient denies any fevers.  No shortness of breath at rest but with movement or ambulation patient feels increasing shortness of breath.  Patient also reports that his abdomen has been distended and he was experiencing significant nausea and emesis to the point he was unable to drink or eat.  Patient states that he will be constipated, then have episodes of diarrhea then return to constipation as well.  Imaging reveals findings concerning for pneumonia in the right lower lung field.  No elevated white blood cell count but lactic was elevated.  As such I have treated the patient with Rocephin and azithromycin.  Given patient's end-stage renal disease as well as significant ascites I have provided some fluid, however I have not aggressively given IV fluids.  Patient has an elevated lactic, elevated troponin.  I do suspect lactic acid is somewhat related to his community-acquired pneumonia.  It appears that patient has a chronically elevated troponin and it has not been as elevated at this visit as it has been in previous visits.  Given patient's deconditioning, inability to maintain good hydration or intake of solids at home there would be concern that patient would stay hydrated and to be able to take his medications appropriately.  In addition with multiple other findings including his weakness causing him to miss dialysis I feel the patient would be best suited with inpatient admission.  I will contact the hospitalist for admission at this time.Marland Kitchen    ----------------------------------------- 7:38 PM on 12/22/2019 -----------------------------------------  At 1845 hrs., nursing staff requested that I come evaluate the patient. Patient had gotten up to use the restroom, started complaining of feeling "very  bad." According to nursing staff, his O2 saturations fell, his blood pressure had also fallen. Patient initially was alert, oriented, we were discussing his symptoms. Patient states that he felt overwhelmingly sick on his stomach. Patient denied any other complaints. While  I was talking with the patient, patient became unresponsive. I checked for a pulse, and this was absent. We began CPR at that time. Patient had three rounds of CPR with epinephrine administered every 2 minutes. Patient was intubated during his code. ROSC was obtained and patient's blood pressure was initially in the forties over twenties. Levophed was started. Bedside paracentesis was performed by Dr. Kerman Passey.   At this time I called intensivist and the patient will be admitted to ICU.   ____________________________________________  FINAL CLINICAL IMPRESSION(S) / ED DIAGNOSES  Final diagnoses:  Weakness  Community acquired pneumonia of right lower lobe of lung  Other ascites  Elevated troponin  ESRD (end stage renal disease) on dialysis ([place])      NEW MEDICATIONS STARTED DURING THIS VISIT:  ED Discharge Orders    None          This chart was dictated using voice recognition software/Dragon. Despite best efforts to proofread, errors can occur which can change the meaning. Any change was purely unintentional.    Darletta Moll, PA-C 01/13/2020 1945    Harvest Dark, MD 01/03/20 0005

## 2020-01-02 NOTE — ED Notes (Signed)
Epinephrine 0.1mg  given at 6:52pm

## 2020-01-02 NOTE — Progress Notes (Signed)
eLink Physician-Brief Progress Note Patient Name: Mishael Haran DOB: 10-05-1976 MRN: [id number]   Date of Service  01/06/2020  HPI/Events of Note  Earlier accepted from ED and discussed with bed side MD . Now he is in ICU.  45 yr old male with hx of ESRD on HD, failure to thrive with large ascitis on CT abdomen. Recent CAP 2 months ago, had PEA arrest and coded then. In ED went into PEArest , 3 rounds of ACLS-CPR-epi once, on vent.  Got Paracentesis in ED.  Data: Reviewed. LA 5.4, CxR LLL PNA  Wbc 7.7, pH 7.01. Troponin decreasing.  And OG in place.   Camera: HR 88, 100% sats. MAP > 65 on Levophed at 6, propofol at 30, sod bicarb gtt. In synchrony Ve 7 lit.  A/P: 1.PEA arrest in ED. Large ascites. On Vent. S/p 2 lit paracentesis. No HT, since post arrest has purposeful movement. 2. ESRD on 3 x wk HD. K ok. Non compliant with HD. 3. Severe AGMA  4. LLL pNA acute vs ongoing. On rocephin and azithr for now. Wbc normal. Upgrade abx if worsening.    eICU Interventions  - continue current care. - on VAP bundle.  - on VTE prophylaxis.  No intervention from eICU side. Follow ABG. -      Intervention Category Major Interventions: Acid-Base disturbance - evaluation and management;Respiratory failure - evaluation and management;Hypotension - evaluation and management Intermediate Interventions: Abdominal pain - evaluation and management Evaluation Type: New Patient Evaluation  Elmer Sow 12/31/2019, 10:13 PM

## 2020-01-02 NOTE — ED Provider Notes (Signed)
Angiocath insertion Performed by: Lavonia Drafts  Consent: Verbal consent obtained. Risks and benefits: risks, benefits and alternatives were discussed Time out: Immediately prior to procedure a "time out" was called to verify the correct patient, procedure, equipment, support staff and site/side marked as required.  Preparation: Patient was prepped and draped in the usual sterile fashion.  Vein Location: right arm  Ultrasound Guided  Gauge: 20  Normal blood return and flush without difficulty Patient tolerance: Patient tolerated the procedure well with no immediate complications.      Lavonia Drafts, MD 01/16/2020 1359

## 2020-01-02 NOTE — ED Notes (Signed)
Report from [place], [place].

## 2020-01-02 NOTE — ED Notes (Signed)
Pt has pulse at this time

## 2020-01-02 NOTE — ED Notes (Signed)
Epi 0.1mg  given at 6:48pm

## 2020-01-02 NOTE — ED Notes (Signed)
Pulse check now, no pulse, continuing CPR

## 2020-01-02 NOTE — ED Notes (Signed)
Brett Small, Brett Small informed of critical lactic acid

## 2020-01-02 NOTE — ED Notes (Signed)
Pulse check at 6:50pm, no pulse, continuing CPR

## 2020-01-02 NOTE — ED Notes (Signed)
IV access attempted by this RN x 2 without success, IV team consult placed

## 2020-01-02 NOTE — ED Notes (Signed)
Pt c/o feeling "really bad", BP 64/43 and pt c/o SHOB. Roderic Palau, [place] informed and is at bedside

## 2020-01-02 NOTE — ED Triage Notes (Addendum)
Pt arrives via [facility] from home for generalized weakness since he was "last discharged". Pt d/c from here on 02/24. Pt reports he has been having trouble walking with fatigue but no dizziness. Reports he has been going to dialysis regularly and was supposed to go today as well. Reports pain 7/10 in both legs, NAD noted, A&Ox4. Pt reports nausea and diarrhea.

## 2020-01-02 NOTE — ED Notes (Signed)
CPR started by Roderic Palau, [place] @6 :47pm

## 2020-01-03 ENCOUNTER — Inpatient Hospital Stay (HOSPITAL_COMMUNITY)
Admit: 2020-01-03 | Discharge: 2020-01-03 | Disposition: A | Discharge status: Home / Self Care | Payer: Medicare Other | Attending: Pulmonary Disease | Admitting: Pulmonary Disease

## 2020-01-03 DIAGNOSIS — I313 Pericardial effusion (noninflammatory): Secondary | ICD-10-CM

## 2020-01-03 DIAGNOSIS — I255 Ischemic cardiomyopathy: Secondary | ICD-10-CM

## 2020-01-03 LAB — BLOOD GAS, ARTERIAL
Acid-Base Excess: 1 mmol/L (ref 0.0–2.0)
Acid-base deficit: 7.1 mmol/L — ABNORMAL HIGH (ref 0.0–2.0)
Bicarbonate: 22.2 mmol/L (ref 20.0–28.0)
Bicarbonate: 14.9 mmol/L — ABNORMAL LOW (ref 20.0–28.0)
FIO2: 0.3
FIO2: 100
MECHVT: 380 mL
MECHVT: 380 mL
Mechanical Rate: 20
Mechanical Rate: 20
O2 Saturation: 99.5 %
O2 Saturation: 100 %
PEEP: 5 cmH2O
PEEP: 5 cmH2O
Patient temperature: 37
Patient temperature: 37
RATE: 20 resp/min
pCO2 arterial: 26 mmHg — ABNORMAL LOW (ref 32.0–48.0)
pCO2 arterial: 22 mmHg — ABNORMAL LOW (ref 32.0–48.0)
pH, Arterial: 7.54 — ABNORMAL HIGH (ref 7.350–7.450)
pH, Arterial: 7.44 (ref 7.350–7.450)
pO2, Arterial: 149 mmHg — ABNORMAL HIGH (ref 83.0–108.0)
pO2, Arterial: 421 mmHg — ABNORMAL HIGH (ref 83.0–108.0)

## 2020-01-03 LAB — MAGNESIUM: Magnesium: 1.8 mg/dL (ref 1.7–2.4)

## 2020-01-03 LAB — GLUCOSE, CAPILLARY
Glucose-Capillary: 103 mg/dL — ABNORMAL HIGH (ref 70–99)
Glucose-Capillary: 109 mg/dL — ABNORMAL HIGH (ref 70–99)
Glucose-Capillary: 148 mg/dL — ABNORMAL HIGH (ref 70–99)
Glucose-Capillary: 164 mg/dL — ABNORMAL HIGH (ref 70–99)
Glucose-Capillary: 69 mg/dL — ABNORMAL LOW (ref 70–99)
Glucose-Capillary: 78 mg/dL (ref 70–99)
Glucose-Capillary: 86 mg/dL (ref 70–99)
Glucose-Capillary: 92 mg/dL (ref 70–99)
Glucose-Capillary: 97 mg/dL (ref 70–99)

## 2020-01-03 LAB — TROPONIN I (HIGH SENSITIVITY): Troponin I (High Sensitivity): 151 ng/L (ref <18)

## 2020-01-03 LAB — PHOSPHORUS: Phosphorus: 7.3 mg/dL — ABNORMAL HIGH (ref 2.5–4.6)

## 2020-01-03 LAB — CBC
HCT: 32.3 % — ABNORMAL LOW (ref 39.0–52.0)
Hemoglobin: 10.7 g/dL — ABNORMAL LOW (ref 13.0–17.0)
MCH: 30.7 pg (ref 26.0–34.0)
MCHC: 33.1 g/dL (ref 30.0–36.0)
MCV: 92.6 fL (ref 80.0–100.0)
Platelets: 45 10*3/uL — ABNORMAL LOW (ref 150–400)
RBC: 3.49 MIL/uL — ABNORMAL LOW (ref 4.22–5.81)
RDW: 17.9 % — ABNORMAL HIGH (ref 11.5–15.5)
WBC: 5.5 10*3/uL (ref 4.0–10.5)
nRBC: 0 % (ref 0.0–0.2)

## 2020-01-03 LAB — ECHOCARDIOGRAM COMPLETE
Height: 61 in
Weight: 1403.89 oz

## 2020-01-03 LAB — BASIC METABOLIC PANEL
Anion gap: 13 (ref 5–15)
BUN: 33 mg/dL — ABNORMAL HIGH (ref 6–20)
CO2: 24 mmol/L (ref 22–32)
Calcium: 7.8 mg/dL — ABNORMAL LOW (ref 8.9–10.3)
Chloride: 99 mmol/L (ref 98–111)
Creatinine, Ser: 5.68 mg/dL — ABNORMAL HIGH (ref 0.61–1.24)
GFR calc Af Amer: 13 mL/min — ABNORMAL LOW (ref >60)
GFR calc non Af Amer: 11 mL/min — ABNORMAL LOW (ref >60)
Glucose, Bld: 116 mg/dL — ABNORMAL HIGH (ref 70–99)
Potassium: 3 mmol/L — ABNORMAL LOW (ref 3.5–5.1)
Sodium: 136 mmol/L (ref 135–145)

## 2020-01-03 LAB — LACTIC ACID, PLASMA: Lactic Acid, Venous: 2.4 mmol/L (ref 0.5–1.9)

## 2020-01-03 LAB — PROCALCITONIN
Procalcitonin: 0.63 ng/mL
Procalcitonin: 1.49 ng/mL

## 2020-01-03 LAB — SARS CORONAVIRUS 2 (TAT 6-24 HRS): SARS Coronavirus 2: NEGATIVE

## 2020-01-03 MED ORDER — SODIUM BICARBONATE 8.4 % IV SOLN
100.0000 meq | Freq: Once | INTRAVENOUS | Status: AC
  Administered 2020-01-04: 100 meq via INTRAVENOUS
  Filled 2020-01-03: qty 100

## 2020-01-03 MED ORDER — SODIUM BICARBONATE-DEXTROSE 150-5 MEQ/L-% IV SOLN: 150.0000 meq | INTRAVENOUS | Status: DC

## 2020-01-03 MED ORDER — PERFLUTREN LIPID MICROSPHERE
1.0000 mL | INTRAVENOUS | Status: AC | PRN
  Administered 2020-01-03: 4 mL via INTRAVENOUS
  Filled 2020-01-03: qty 10

## 2020-01-03 MED ORDER — FAMOTIDINE IN NACL 20-0.9 MG/50ML-% IV SOLN
20.0000 mg | INTRAVENOUS | Status: DC
  Administered 2020-01-04: 20 mg via INTRAVENOUS
  Filled 2020-01-03: qty 50

## 2020-01-03 MED ORDER — DEXTROSE 5 % IV SOLN: INTRAVENOUS | Status: DC

## 2020-01-03 MED ORDER — CHLORHEXIDINE GLUCONATE CLOTH 2 % EX PADS
6.0000 | MEDICATED_PAD | Freq: Every day | CUTANEOUS | Status: DC
  Administered 2020-01-03: 6 via TOPICAL

## 2020-01-03 MED ORDER — VASOPRESSIN 20 UNIT/ML IV SOLN
0.0300 [IU]/min | INTRAVENOUS | Status: DC
  Administered 2020-01-04 (×2): 0.03 [IU]/min via INTRAVENOUS
  Filled 2020-01-03 (×3): qty 2

## 2020-01-03 NOTE — Progress Notes (Signed)
Follow up - Critical Care Medicine Note  Patient Details:    Brett Small is an 44 y.o. male.with ESRD on HD admitted s/p in hospital PEA arrest with hypotension, acute hypoxic respiratory failure in the setting of severe ascites, possible RLL pneumonia and severe metabolic acidosis requiring intubation and mechanical ventilation.  Patient also requiring levophed gtt .  Lines, Airways, Drains: Airway 7.5 mm (Active)  Secured at (cm) 22 cm 01/03/20 1206  Measured From Lips 01/03/20 1600  Secured Location Right 01/03/20 1600  Secured By [employer] 01/03/20 1600  Tube Holder Repositioned Yes 01/03/20 1600  Cuff Pressure (cm H2O) 28 cm H2O 01/03/20 1206  Site Condition Dry 01/03/20 1600     NG/OG Tube Orogastric 14 Fr. Right mouth Aucultation Measured external length of tube 55 cm (Active)  Site Assessment Clean;Dry;Intact 01/03/20 1600  Status Clamped 01/03/20 1600    Anti-infectives:  Anti-infectives (From admission, onward)   Start     Dose/Rate Route Frequency Ordered Stop   01/03/20 2000  azithromycin (ZITHROMAX) 500 mg in sodium chloride 0.9 % 250 mL IVPB     500 mg 250 mL/hr over 60 Minutes Intravenous Every 24 hours 01/04/2020 1952 01/09/20 1959   01/03/20 2000  cefTRIAXone (ROCEPHIN) 1 g in sodium chloride 0.9 % 100 mL IVPB     1 g 200 mL/hr over 30 Minutes Intravenous Every 24 hours 12/29/2019 1952 01/09/20 1959   01/08/2020 1715  cefTRIAXone (ROCEPHIN) 1 g in sodium chloride 0.9 % 100 mL IVPB     1 g 200 mL/hr over 30 Minutes Intravenous  Once 12/30/2019 1704 01/07/2020 1809   12/28/2019 1715  azithromycin (ZITHROMAX) 500 mg in sodium chloride 0.9 % 250 mL IVPB     500 mg 250 mL/hr over 60 Minutes Intravenous  Once 01/13/2020 1704 01/15/2020 1900      Microbiology: Results for orders placed or performed during the hospital encounter of 12/28/2019  SARS CORONAVIRUS 2 (TAT 6-24 HRS) Nasopharyngeal Nasopharyngeal Swab     Status: None   Collection Time: 12/28/2019  2:21 PM    Specimen: Nasopharyngeal Swab  Result Value Ref Range Status   SARS Coronavirus 2 NEGATIVE NEGATIVE Final    Comment: (NOTE) SARS-CoV-2 target nucleic acids are NOT DETECTED. The SARS-CoV-2 RNA is generally detectable in upper and lower respiratory specimens during the acute phase of infection. Negative results do not preclude SARS-CoV-2 infection, do not rule out co-infections with other pathogens, and should not be used as the sole basis for treatment or other patient management decisions. Negative results must be combined with clinical observations, patient history, and epidemiological information. The expected result is Negative. Fact Sheet for Patients: SugarRoll.be Fact Sheet for Healthcare Providers: https://www.woods-mathews.com/ This test is not yet approved or cleared by the [place] FDA and  has been authorized for detection and/or diagnosis of SARS-CoV-2 by FDA under an Emergency Use Authorization (EUA). This EUA will remain  in effect (meaning this test can be used) for the duration of the COVID-19 declaration under Section 56 4(b)(1) of the Act, 21 U.S.C. section 360bbb-3(b)(1), unless the authorization is terminated or revoked sooner. Performed at [facility], [place] [address], [place], [place] [address]   MRSA PCR Screening     Status: None   Collection Time: 01/01/2020  9:12 PM   Specimen: Nasopharyngeal  Result Value Ref Range Status   MRSA by PCR NEGATIVE NEGATIVE Final    Comment:        The GeneXpert MRSA  Assay (FDA approved for NASAL specimens only), is one component of a comprehensive MRSA colonization surveillance program. It is not intended to diagnose MRSA infection nor to guide or monitor treatment for MRSA infections. Performed at [facility], [address], [place], [place] [address]     Best Practice/Protocols:  VTE Prophylaxis: Heparin (SQ) GI Prophylaxis:  Antihistamine Continous Sedation  Events: 03/13: Pt admitted to ICU mechanically ventilated post PEA arrest 03/12: CT Abd Pelvis revealed large amount of ascites, increased when compared to the prior CT. Patchy ground-glass opacities at the right lung base. Consider pneumonia if there are consistent clinical findings. Left lung base opacity is more consistent with atelectasis. Small left and minimal right pleural effusions. Small pericardial effusion. Marked renal atrophy consistent with end-stage renal disease. Significant atherosclerotic disease with aortic, superior mesenteric artery and right common iliac artery stents, stable from the prior CT. 3/13: Echocardiogram interpretation by Dr. Jackelyn Hoehn: Left apical thrombus.  Left ventricular ejection fraction is less than 20% (previous 25 to 30%), grade 2 diastolic dysfunction.  Right ventricular systolic function is severely reduced.  Moderate mitral valve regurgitation tricuspid regurgitation.  Studies: CT ABDOMEN PELVIS WO CONTRAST  Result Date: 01/03/2020 CLINICAL DATA:  Per RN note in pt chart "Pt arrives via [facility] from home for generalized weakness since he was "last discharged". Pt d/c from here on 02/24. Pt reports he has been having trouble walking with fatigue but no dizziness. Reports he has been going to dialysis regularly and was supposed to go today as well. Reports pain 7/10 in both legs, NAD noted, A&Ox4. Pt reports nausea and diarrhea" EXAM: CT ABDOMEN AND PELVIS WITHOUT CONTRAST TECHNIQUE: Multidetector CT imaging of the abdomen and pelvis was performed following the standard protocol without IV contrast. COMPARISON:  07/20/2019 FINDINGS: Lower chest: Small left and minimal right pleural effusions. Small pericardial effusion. There is hazy ground-glass opacity at the right lung base. More confluent opacity is noted in the left lower lobe and inferolateral left upper lobe lingula consistent with atelectasis. Hepatobiliary: Liver  normal in size and attenuation. No masses. Some increased attenuation in the gallbladder which could reflect small stones. No convincing acute cholecystitis or bile duct dilation. Pancreas: Unremarkable. No pancreatic ductal dilatation or surrounding inflammatory changes. Spleen: Normal in size without focal abnormality. Adrenals/Urinary Tract: No adrenal masses. Marked bilateral renal atrophy. No hydronephrosis. Ureters not well visualized. No ureteral dilation. Bladder is decompressed. Stomach/Bowel: No bowel dilation to suggest obstruction. No wall thickening or convincing inflammation. Normal stomach. Vascular/Lymphatic: Superior mesenteric artery, aortic and right common iliac artery stents. No discrete enlarged lymph nodes. Reproductive: Unremarkable. Other: Large amount of ascites distends the abdomen. Musculoskeletal: Chronic bilateral pars defects at L5-S1 with a grade 1 anterolisthesis. No acute fracture. No osteoblastic or osteolytic lesions. IMPRESSION: 1. Large amount of ascites, increased when compared to the prior CT. 2. Patchy ground-glass opacities at the right lung base. Consider pneumonia if there are consistent clinical findings. Left lung base opacity is more consistent with atelectasis. 3. Small left and minimal right pleural effusions. Small pericardial effusion. 4. Marked renal atrophy consistent with end-stage renal disease. 5. Significant atherosclerotic disease with aortic, superior mesenteric artery and right common iliac artery stents, stable from the prior CT. Electronically Signed   By: Lajean Manes M.D.   On: 01/01/2020 14:57   DG Chest 1 View  Result Date: 01/18/2020 CLINICAL DATA:  44 year old male status post intubation. EXAM: CHEST  1 VIEW COMPARISON:  Earlier radiograph dated 01/12/2020. FINDINGS: Endotracheal tube  with tip approximately 6.7 cm above the carina. Enteric tube extends below the diaphragm with tip in the body of the stomach. Dialysis catheter in similar  position. No significant interval change in the appearance of the lungs or cardiomediastinal silhouette since the earlier radiograph. IMPRESSION: Interval placement of an endotracheal tube with tip above the carina and enteric tube with tip projecting over the body of the stomach. Electronically Signed   By: Anner Crete M.D.   On: 12/24/2019 19:45   DG Chest 1 View  Result Date: 12/22/2019 CLINICAL DATA:  Weakness. Recent pneumonia. EXAM: CHEST  1 VIEW COMPARISON:  12/15/2019 prior radiographs FINDINGS: Cardiomegaly and LEFT IJ central venous catheter with tip overlying the UPPER RIGHT atrium again noted. Small LEFT pleural effusion and LEFT basilar atelectasis/opacity again noted. A LEFT axillary vascular stent is present. Mild pulmonary vascular congestion is present. No pneumothorax or acute bony. IMPRESSION: Unchanged chest radiograph with cardiomegaly, mild pulmonary vascular congestion and small LEFT pleural effusion/LEFT basilar opacity. Electronically Signed   By: Margarette Canada M.D.   On: 01/11/2020 13:18   PERIPHERAL VASCULAR CATHETERIZATION  Result Date: 12/16/2019 See op note  PERIPHERAL VASCULAR CATHETERIZATION  Result Date: 12/15/2019 See op note  DG Chest Portable 1 View  Result Date: 12/15/2019 CLINICAL DATA:  Dialysis catheter EXAM: PORTABLE CHEST 1 VIEW COMPARISON:  10/28/2019 FINDINGS: Right dialysis catheter tip overlies right atrium. Small left pleural effusion and left basilar atelectasis. Stable heart size. IMPRESSION: Right dialysis catheter tip overlies right atrium. Small left pleural effusion and left basilar atelectasis. Electronically Signed   By: Macy Mis M.D.   On: 12/15/2019 13:48   VAS [place] UPPER EXTREMITY ARTERIAL DUPLEX  Result Date: 12/29/2019 UPPER EXTREMITY DUPLEX STUDY Indications: Patient complains of Status Post Rt Arm Angioplasty.  Other Factors: 11/02/2019 right brachial and ulnar pta with dis brach stent. Comparison Study: 11/13/2019 Performing  Technologist: Almira Coaster RVS  Examination Guidelines: A complete evaluation includes B-mode imaging, spectral Doppler, color Doppler, and power Doppler as needed of all accessible portions of each vessel. Bilateral testing is considered an integral part of a complete examination. Limited examinations for reoccurring indications may be performed as noted.  Right Doppler Findings: +---------------+----------+---------+--------+--------+ Site           PSV (cm/s)Waveform StenosisComments +---------------+----------+---------+--------+--------+ Subclavian Prox15        triphasic                 +---------------+----------+---------+--------+--------+ Subclavian Dist          biphasic                  +---------------+----------+---------+--------+--------+ Axillary       0.14      triphasic                 +---------------+----------+---------+--------+--------+ Brachial Prox  12        triphasic                 +---------------+----------+---------+--------+--------+ Brachial Mid   47        triphasic                 +---------------+----------+---------+--------+--------+ Brachial Dist  37        triphasic                 +---------------+----------+---------+--------+--------+ Radial Prox    30        biphasic                  +---------------+----------+---------+--------+--------+  Radial Dist    33        biphasic                  +---------------+----------+---------+--------+--------+ Ulnar Prox     21        biphasic                  +---------------+----------+---------+--------+--------+   Summary:  Right: Imaging and Waveforms obtained throughout in the Right Upper        Extremity. Compared to prior study on 11/13/2019 Waveforms        have improved. *See table(s) above for measurements and observations. Electronically signed by Leotis Pain MD on 12/29/2019 at 4:36:49 PM.    Final    ECHOCARDIOGRAM COMPLETE  Result Date: 01/03/2020     ECHOCARDIOGRAM REPORT   Patient Name:   TAMI BARREN Date of Exam: 01/03/2020 Medical Rec #:  [id number]      Height:       61.0 in Accession #:    [id number]     Weight:       87.7 lb Date of Birth:  27-Dec-1975      BSA:          1.331 m Patient Age:    43 years       BP:           113/87 mmHg Patient Gender: M              HR:           84 bpm. Exam Location:  [facility] Procedure: 2D Echo and Intracardiac Opacification Agent Indications:     Pericardial Effusion  History:         Patient has prior history of Echocardiogram examinations. Risk                  Factors:Hypertension and ESRD.  Sonographer:     LTM Referring Phys:  [id number] Espinoza Veda Canning Diagnosing Phys: Kate Sable MD IMPRESSIONS  1. There is a 7 x 5 mm thrombus in the LV apex. Left ventricular ejection fraction, by estimation, is <20%. The left ventricle has severely decreased function. The left ventricle demonstrates global hypokinesis. Left ventricular diastolic parameters are  consistent with Grade II diastolic dysfunction (pseudonormalization).  2. Right ventricular systolic function is severely reduced. The right ventricular size is normal.  3. Left atrial size was moderately dilated.  4. Right atrial size was mildly dilated.  5. The pericardial effusion is circumferential. Large pleural effusion in the left lateral region.  6. The mitral valve is grossly normal. Moderate mitral valve regurgitation.  7. Tricuspid valve regurgitation is mild to moderate.  8. The aortic valve is tricuspid. Aortic valve regurgitation is trivial. Conclusion(s)/Recommendation(s): Severe Biventricular dysfunction. Apical LV thombus present. Recommend anticoagulation with clinically appropriate. FINDINGS  Left Ventricle: There is a 7 x 5 mm thrombus in the LV apex. Left ventricular ejection fraction, by estimation, is <20%. The left ventricle has severely decreased function. The left ventricle demonstrates global hypokinesis. Definity contrast agent was given IV to  delineate the left ventricular endocardial borders. The left ventricular internal cavity size was normal in size. There is no left ventricular hypertrophy. Left ventricular diastolic parameters are consistent with Grade II diastolic dysfunction (pseudonormalization). Right Ventricle: The right ventricular size is normal. Right vetricular wall thickness was not assessed. Right ventricular systolic function is severely reduced. Left Atrium: Left atrial size was moderately dilated. Right Atrium: Right atrial size was mildly dilated.  Pericardium: A small pericardial effusion is present. The pericardial effusion is circumferential. Mitral Valve: The mitral valve is grossly normal. Moderate mitral valve regurgitation. Tricuspid Valve: The tricuspid valve is grossly normal. Tricuspid valve regurgitation is mild to moderate. Aortic Valve: The aortic valve is tricuspid. Aortic valve regurgitation is trivial. Aortic regurgitation PHT measures 518 msec. Aortic valve mean gradient measures 2.0 mmHg. Aortic valve peak gradient measures 4.2 mmHg. Aortic valve area, by VTI measures  1.22 cm. Pulmonic Valve: The pulmonic valve was normal in structure. Pulmonic valve regurgitation is not visualized. Aorta: The aortic root is normal in size and structure. Venous: IVC assessment for right atrial pressure unable to be performed due to mechanical ventilation. IAS/Shunts: No atrial level shunt detected by color flow Doppler. Additional Comments: There is a large pleural effusion in the left lateral region.  LEFT VENTRICLE PLAX 2D LVIDd:         4.99 cm  Diastology LVIDs:         4.59 cm  LV e' lateral:   4.20 cm/s LV PW:         1.23 cm  LV E/e' lateral: 13.4 LV IVS:        0.93 cm  LV e' medial:    3.68 cm/s LVOT diam:     1.70 cm  LV E/e' medial:  15.3 LV SV:         18 LV SV Index:   13 LVOT Area:     2.27 cm  RIGHT VENTRICLE RV S prime:     5.70 cm/s TAPSE (M-mode): 1.1 cm LEFT ATRIUM             Index LA diam:        3.10 cm 2.33  cm/m LA Vol (A2C):   66.8 ml 50.20 ml/m LA Vol (A4C):   56.3 ml 42.31 ml/m LA Biplane Vol: 62.8 ml 47.19 ml/m  AORTIC VALVE AV Area (Vmax):    1.38 cm AV Area (Vmean):   1.36 cm AV Area (VTI):     1.22 cm AV Vmax:           102.00 cm/s AV Vmean:          68.400 cm/s AV VTI:            0.147 m AV Peak Grad:      4.2 mmHg AV Mean Grad:      2.0 mmHg LVOT Vmax:         62.10 cm/s LVOT Vmean:        41.100 cm/s LVOT VTI:          0.079 m LVOT/AV VTI ratio: 0.54 AI PHT:            518 msec  AORTA Ao Root diam: 2.80 cm MITRAL VALVE               TRICUSPID VALVE MV Area (PHT): 4.15 cm    TR Peak grad:   31.6 mmHg MV E velocity: 56.30 cm/s  TR Vmax:        281.00 cm/s MV A velocity: 50.90 cm/s MV E/A ratio:  1.11        SHUNTS                            Systemic VTI:  0.08 m  Systemic Diam: 1.70 cm Kate Sable MD Electronically signed by Kate Sable MD Signature Date/Time: 01/03/2020/4:01:27 PM    Final     Consults: Treatment Team:  Pccm, Ander Gaster, MD Lavonia Dana, MD   Subjective:    Overnight Issues: Remains on ventilator postarrest, awakening some, follows commands erratically.  Still very weak. Objective:  Vital signs for last 24 hours: Temp:  [97.7 F (36.5 C)-98.2 F (36.8 C)] 98.2 F (36.8 C) (03/14 1500) Pulse Rate:  [29-99] 99 (03/14 1800) Resp:  [18-26] 20 (03/14 1900) BP: (98-131)/(69-110) 101/75 (03/14 1900) SpO2:  [82 %-100 %] 82 % (03/14 1800) FiO2 (%):  [21 %-60 %] 21 % (03/14 1206) Weight:  [39.8 kg] 39.8 kg (03/13 2200)  Hemodynamic parameters for last 24 hours:    Intake/Output from previous day: 03/13 0701 - 03/14 0700 In: 1496.3 [I.V.:596.3; IV Piggyback:900] Out: 5 [Urine:5]  Intake/Output this shift: No intake/output data recorded.  Vent settings for last 24 hours: Vent Mode: PRVC FiO2 (%):  [21 %-60 %] 21 % Set Rate:  [20 bmp] 20 bmp Vt Set:  [380 mL] 380 mL PEEP:  [5 cmH20] 5 cmH20 Plateau Pressure:  [16  cmH20-17 cmH20] 16 cmH20  Physical Exam:  General: acutely ill cachetic appearing male, very pale, intubated, mechanically ventilated, synchronous with the vent. Neuro: sedated, does awaken, erratic following of commands, PERRL HEENT: supple, + JVD  Cardiovascular: nsr, rrr, cannot appreciate murmur due to rhonchi, vent sounds, right IJ permcath present  Lungs: rhonchi throughout, no wheezes, even, non labored  Abdomen: +BS x4, soft, non distended.  No significant reaccumulation of ascites so far. Musculoskeletal: severely cachetic, no edema  Skin: intact no rashes or lesions present   Assessment/Plan:  Acute hypoxic respiratory failure secondary to severe metabolic acidosis, possible LLL pneumonia, and severe ascites (s/p paracentesis 12/26/2019 with 2L fluid removal) Mechanical ventilation post cardiac arrest Full vent support for now-vent settings reviewed with RT SBT's once all parameters met  VAP bundle implemented  PRN bronchodilator therapy  NOT a candidate for hypothermic protocol pt with purposeful movement post cardiac arrest, etiology of cardiac arrest PEA, hypothermia protocol not indicated for this instance  PEA arrest and hypotension  secondary to severe metabolic acidosis Elevated troponin's secondary to demand ischemia vs NSTEMI Severe cardiomyopathy, worse compared to prior Ascites likely cardiac ascites from failure of RV and poor LV function  Hx: HTN, DVT, PVD, and PAD Continuous telemetry monitoring  Trend troponin's  PRN levophed gtt to maintain map >65 Continue to hold outpatient antihypertensives for now    ESRD on HD  Severe metabolic acidosis  Lactic acidosis Trend BMP and lactic acid  Replace electrolytes as indicated  Avoid nephrotoxic medications  Will start sodium bicarb gtt '@75'$  ml/hr, DC postdialysis Nephrology consulted appreciate input Patient to receive dialysis today  Possible RLL pneumonia  Trend WBC and monitor fever curve  Trend PCT   Follow cultures  Continue azithromycin and ceftriaxone   Chronic thrombocytopenia  Trend CBC  Transfuse for hgb <7 and/or active bleeding   Mechanical ventilation pain/discomfort  Maintain RASS goal -1 to -2 Propofol gtt and prn versed/fentanly to maintain RASS goal  WUA daily   Severe protein calorie malnutrition BMI 16.5 Nutritionist consultation    LOS: 1 day   Additional comments: We will need to reassess the patient's goals of care.  He has severe cardiomyopathy, multiple episodes of cardiac arrest, mural thrombus and RV failure this in the setting of severe protein calorie malnutrition/cachexia and ESRD.  Prognosis for long-term survival poor.  Critical Care Total Time*: 40 Minutes  C. Derrill Kay, MD [place] [facility] 01/03/2020  *Care during the described time interval was provided by me and/or other providers on the critical care team.  I have reviewed this patient's available data, including medical history, events of note, physical examination and test results as part of my evaluation.  **This note was dictated using voice recognition software/Dragon.  Despite best efforts to proofread, errors can occur which can change the meaning.  Any change was purely unintentional.

## 2020-01-03 NOTE — Procedures (Signed)
Arterial Catheter Insertion Procedure Note Brett Small [id number] 10-08-1976  Procedure: Insertion of Arterial Catheter  Indications: Blood pressure monitoring and Frequent blood sampling  Procedure Details Consent: Unable to obtain consent because of emergent medical necessity. Time Out: Verified patient identification, verified procedure, site/side was marked, verified correct patient position, special equipment/implants available, medications/allergies/relevent history reviewed, required imaging and test results available.  Performed  Maximum sterile technique was used including antiseptics, cap, gloves, gown, hand hygiene, mask and sheet. Skin prep: Chlorhexidine; local anesthetic administered 20 gauge catheter was inserted into right femoral artery using the Seldinger technique. ULTRASOUND GUIDANCE USED: YES Evaluation Blood flow good; BP tracing good. Complications: No apparent complications.  Right femoral arterial line placed emergently no complications noted during or following procedure.  Brett Small, [place] Pager [phone] (please enter 7 digits) PCCM Consult Pager [phone] (please enter 7 digits)

## 2020-01-03 NOTE — Progress Notes (Signed)
[facility]  ROUNDING NOTE   Subjective:   [name] admitted to [facility] on 01/19/2020 for Cardiac arrest [facility]) [I46.9] Weakness [R53.1] Elevated troponin [R77.8] Other ascites [R18.8] ESRD (end stage renal disease) on dialysis ([place]) [N18.6, Z99.2] Generalized weakness [R53.1] Community acquired pneumonia of right lower lobe of lung [J18.9]  Patient's last hemodialysis treatment was 3/9. Metabolic acidosis on admission. Given sodium bicarbonate, required norepinephrine infusion overnight.  Intubated and sedated. Placed on azithromycin and ceftriaxone.   Objective:  Vital signs in last 24 hours:  Temp:  [97.7 F (36.5 C)-98.2 F (36.8 C)] 98.2 F (36.8 C) (03/14 0745) Pulse Rate:  [29-114] 66 (03/14 0700) Resp:  [19-33] 20 (03/14 0700) BP: (50-134)/(25-111) 104/81 (03/14 0600) SpO2:  [25 %-100 %] 98 % (03/14 0758) FiO2 (%):  [21 %-100 %] 21 % (03/14 0758) Weight:  [39.8 kg-45.4 kg] 39.8 kg (03/13 2200)  Weight change:  Filed Weights   01/13/2020 1232 01/16/2020 2200  Weight: 45.4 kg 39.8 kg    Intake/Output: I/O last 3 completed shifts: In: 1496.3 [I.V.:596.3; IV Piggyback:900] Out: 5 [Urine:5]   Intake/Output this shift:  No intake/output data recorded.  Physical Exam: General: Critically illl  Head: ETT NGT  Eyes: Anicteric  Neck:  trachea midline  Lungs:  Clear, PRVC FiO2 21%  Heart: Regular rate and rhythm  Abdomen:  Soft   Extremities: no peripheral edema.  Neurologic: Intubated, sedated  Skin: No lesions  Access: Right RIJ permcath    Basic Metabolic Panel: Recent Labs  Lab 01/01/2020 1358 12/25/2019 1916 01/03/20 0626  NA 133*  --  136  K 3.6  --  3.0*  CL 91*  --  99  CO2 22  --  24  GLUCOSE 95  --  116*  BUN 32*  --  33*  CREATININE 5.93* 6.01* 5.68*  CALCIUM 9.0  --  7.8*  MG  --   --  1.8  PHOS  --   --  7.3*    Liver Function Tests: Recent Labs  Lab 01/04/2020 1358  AST 18  ALT 8  ALKPHOS 102  BILITOT 3.0*  PROT  6.6  ALBUMIN 2.9*   Recent Labs  Lab 12/22/2019 1358  LIPASE 23   No results for input(s): AMMONIA in the last 168 hours.  CBC: Recent Labs  Lab 12/30/2019 1358 01/15/2020 1916 01/03/20 0626  WBC 7.7 7.6 5.5  NEUTROABS 5.8  --   --   HGB 13.9 14.1 10.7*  HCT 43.8 47.5 32.3*  MCV 95.2 102.8* 92.6  PLT 88* 73* 45*    Cardiac Enzymes: No results for input(s): CKTOTAL, CKMB, CKMBINDEX, TROPONINI in the last 168 hours.  BNP: Invalid input(s): POCBNP  CBG: Recent Labs  Lab 01/08/2020 2259 01/03/20 0030 01/03/20 0402 01/03/20 0621 01/03/20 0741  GLUCAP 124* 164* 148* 109* 92    Microbiology: Results for orders placed or performed during the hospital encounter of 01/07/2020  SARS CORONAVIRUS 2 (TAT 6-24 HRS) Nasopharyngeal Nasopharyngeal Swab     Status: None   Collection Time: 01/01/2020  2:21 PM   Specimen: Nasopharyngeal Swab  Result Value Ref Range Status   SARS Coronavirus 2 NEGATIVE NEGATIVE Final    Comment: (NOTE) SARS-CoV-2 target nucleic acids are NOT DETECTED. The SARS-CoV-2 RNA is generally detectable in upper and lower respiratory specimens during the acute phase of infection. Negative results do not preclude SARS-CoV-2 infection, do not rule out co-infections with other pathogens, and should not be used as the sole basis  for treatment or other patient management decisions. Negative results must be combined with clinical observations, patient history, and epidemiological information. The expected result is Negative. Fact Sheet for Patients: SugarRoll.be Fact Sheet for Healthcare Providers: https://www.woods-mathews.com/ This test is not yet approved or cleared by the [place] FDA and  has been authorized for detection and/or diagnosis of SARS-CoV-2 by FDA under an Emergency Use Authorization (EUA). This EUA will remain  in effect (meaning this test can be used) for the duration of the COVID-19 declaration under  Section 56 4(b)(1) of the Act, 21 U.S.C. section 360bbb-3(b)(1), unless the authorization is terminated or revoked sooner. Performed at [facility], [place] [address] [place], [place] [address]   MRSA PCR Screening     Status: None   Collection Time: 01/01/2020  9:12 PM   Specimen: Nasopharyngeal  Result Value Ref Range Status   MRSA by PCR NEGATIVE NEGATIVE Final    Comment:        The GeneXpert MRSA Assay (FDA approved for NASAL specimens only), is one component of a comprehensive MRSA colonization surveillance program. It is not intended to diagnose MRSA infection nor to guide or monitor treatment for MRSA infections. Performed at [facility], [address] [place], [place] [address]     Coagulation Studies: No results for input(s): LABPROT, INR in the last 72 hours.  Urinalysis: No results for input(s): COLORURINE, LABSPEC, PHURINE, GLUCOSEU, HGBUR, BILIRUBINUR, KETONESUR, PROTEINUR, UROBILINOGEN, NITRITE, LEUKOCYTESUR in the last 72 hours.  Invalid input(s): APPERANCEUR    Imaging: CT ABDOMEN PELVIS WO CONTRAST  Result Date: 01/04/2020 CLINICAL DATA:  Per RN note in pt chart "Pt arrives via [facility] from home for generalized weakness since he was "last discharged". Pt d/c from here on 02/24. Pt reports he has been having trouble walking with fatigue but no dizziness. Reports he has been going to dialysis regularly and was supposed to go today as well. Reports pain 7/10 in both legs, NAD noted, A&Ox4. Pt reports nausea and diarrhea" EXAM: CT ABDOMEN AND PELVIS WITHOUT CONTRAST TECHNIQUE: Multidetector CT imaging of the abdomen and pelvis was performed following the standard protocol without IV contrast. COMPARISON:  07/20/2019 FINDINGS: Lower chest: Small left and minimal right pleural effusions. Small pericardial effusion. There is hazy ground-glass opacity at the right lung base. More confluent opacity is noted in the left lower lobe and inferolateral left  upper lobe lingula consistent with atelectasis. Hepatobiliary: Liver normal in size and attenuation. No masses. Some increased attenuation in the gallbladder which could reflect small stones. No convincing acute cholecystitis or bile duct dilation. Pancreas: Unremarkable. No pancreatic ductal dilatation or surrounding inflammatory changes. Spleen: Normal in size without focal abnormality. Adrenals/Urinary Tract: No adrenal masses. Marked bilateral renal atrophy. No hydronephrosis. Ureters not well visualized. No ureteral dilation. Bladder is decompressed. Stomach/Bowel: No bowel dilation to suggest obstruction. No wall thickening or convincing inflammation. Normal stomach. Vascular/Lymphatic: Superior mesenteric artery, aortic and right common iliac artery stents. No discrete enlarged lymph nodes. Reproductive: Unremarkable. Other: Large amount of ascites distends the abdomen. Musculoskeletal: Chronic bilateral pars defects at L5-S1 with a grade 1 anterolisthesis. No acute fracture. No osteoblastic or osteolytic lesions. IMPRESSION: 1. Large amount of ascites, increased when compared to the prior CT. 2. Patchy ground-glass opacities at the right lung base. Consider pneumonia if there are consistent clinical findings. Left lung base opacity is more consistent with atelectasis. 3. Small left and minimal right pleural effusions. Small pericardial effusion. 4. Marked renal atrophy consistent with  end-stage renal disease. 5. Significant atherosclerotic disease with aortic, superior mesenteric artery and right common iliac artery stents, stable from the prior CT. Electronically Signed   By: Lajean Manes M.D.   On: 12/25/2019 14:57   DG Chest 1 View  Result Date: 01/09/2020 CLINICAL DATA:  44 year old male status post intubation. EXAM: CHEST  1 VIEW COMPARISON:  Earlier radiograph dated 01/14/2020. FINDINGS: Endotracheal tube with tip approximately 6.7 cm above the carina. Enteric tube extends below the diaphragm with  tip in the body of the stomach. Dialysis catheter in similar position. No significant interval change in the appearance of the lungs or cardiomediastinal silhouette since the earlier radiograph. IMPRESSION: Interval placement of an endotracheal tube with tip above the carina and enteric tube with tip projecting over the body of the stomach. Electronically Signed   By: Anner Crete M.D.   On: 12/23/2019 19:45   DG Chest 1 View  Result Date: 01/14/2020 CLINICAL DATA:  Weakness. Recent pneumonia. EXAM: CHEST  1 VIEW COMPARISON:  12/15/2019 prior radiographs FINDINGS: Cardiomegaly and LEFT IJ central venous catheter with tip overlying the UPPER RIGHT atrium again noted. Small LEFT pleural effusion and LEFT basilar atelectasis/opacity again noted. A LEFT axillary vascular stent is present. Mild pulmonary vascular congestion is present. No pneumothorax or acute bony. IMPRESSION: Unchanged chest radiograph with cardiomegaly, mild pulmonary vascular congestion and small LEFT pleural effusion/LEFT basilar opacity. Electronically Signed   By: Margarette Canada M.D.   On: 12/21/2019 13:18     Medications:   . azithromycin (ZITHROMAX) 500 MG IVPB (Vial-Mate [employer])    . cefTRIAXone (ROCEPHIN)  IV Stopped (01/04/2020 2016)  . [START ON 01/04/2020] famotidine (PEPCID) IV    . norepinephrine (LEVOPHED) Adult infusion Stopped (12/21/2019 2323)  . propofol (DIPRIVAN) infusion 30 mcg/kg/min (01/03/20 0555)  . sodium bicarbonate 150 mEq in dextrose 5% 1000 mL 30 mL/hr at 01/03/20 0612   . Chlorhexidine Gluconate Cloth  6 each Topical Daily   bisacodyl, fentaNYL (SUBLIMAZE) injection, fentaNYL (SUBLIMAZE) injection, ipratropium-albuterol, midazolam, midazolam  Assessment/ Plan:  [name] is a 44 y.o. white male with end stage renal disease on hemodialysis, hypertension, peripheral vascular disease, depression, tobacco abuse admitted on 12/23/2019 for cardiac arrest. Currently intubated and sedated. Weaned off  vasopressors.   [place] [facility] RIJ catheter 39kg  1. End stage renal disease on hemodialysis: last dialysis was 3/9. No urgent indication for dialysis today.  - discontinued sodium bicarb infusion  2. Anemia of chronic kidney disease. Hemoglobin10.7 - hold EPO  3. Hypertension: 104/81 - holding home regimen. Home regimen of amlodipine, carvedilol, and hydralazine.  Weaned off vasopressors.   4. Secondary hyperparathyroidism with hyperphosphatemia: phosphorus 8.2 on 3/9. Resume binders when able to take PO.     LOS: 1 Brett Small 3/14/202110:18 AM

## 2020-01-03 NOTE — Procedures (Signed)
Central Venous Catheter Insertion Procedure Note Jasyn Mey [id number] 08/09/1976  Procedure: Insertion of Central Venous Catheter Indications: Assessment of intravascular volume, Drug and/or fluid administration and Frequent blood sampling  Procedure Details Consent: Unable to obtain consent because of emergent medical necessity. Time Out: Verified patient identification, verified procedure, site/side was marked, verified correct patient position, special equipment/implants available, medications/allergies/relevent history reviewed, required imaging and test results available.  Performed  Maximum sterile technique was used including antiseptics, cap, gloves, gown, hand hygiene, mask and sheet. Skin prep: Chlorhexidine; local anesthetic administered A antimicrobial bonded/coated triple lumen catheter was placed in the right femoral vein due to emergent situation using the Seldinger technique.  Evaluation Blood flow good Complications: No apparent complications Patient did tolerate procedure well. Chest X-ray ordered to verify placement.  CXR: not indicated .  Emergently placed right femoral central line utilizing ultrasound no complications noted during or following procedure.  [name], [place] Pager [phone] (please enter 7 digits) PCCM Consult Pager [phone] (please enter 7 digits)

## 2020-01-04 ENCOUNTER — Other Ambulatory Visit: Payer: Self-pay

## 2020-01-04 ENCOUNTER — Inpatient Hospital Stay: Payer: Medicare Other

## 2020-01-04 DIAGNOSIS — E43 Unspecified severe protein-calorie malnutrition: Secondary | ICD-10-CM | POA: Insufficient documentation

## 2020-01-04 LAB — BLOOD GAS, ARTERIAL
Acid-Base Excess: 3.4 mmol/L — ABNORMAL HIGH (ref 0.0–2.0)
Acid-Base Excess: 1.1 mmol/L (ref 0.0–2.0)
Acid-base deficit: 12.6 mmol/L — ABNORMAL HIGH (ref 0.0–2.0)
Bicarbonate: 23.6 mmol/L (ref 20.0–28.0)
Bicarbonate: 22.9 mmol/L (ref 20.0–28.0)
Bicarbonate: 13.8 mmol/L — ABNORMAL LOW (ref 20.0–28.0)
FIO2: 0.3
FIO2: 0.28
FIO2: 1
MECHVT: 380 mL
MECHVT: 380 mL
MECHVT: 380 mL
Mechanical Rate: 20
Mechanical Rate: 16
O2 Saturation: 99.5 %
O2 Saturation: 99.3 %
O2 Saturation: 99.6 %
PEEP: 5 cmH2O
PEEP: 5 cmH2O
PEEP: 5 cmH2O
Patient temperature: 37
Patient temperature: 37
Patient temperature: 37
RATE: 20 resp/min
RATE: 16 resp/min
RATE: 16 resp/min
pCO2 arterial: 24 mmHg — ABNORMAL LOW (ref 32.0–48.0)
pCO2 arterial: 28 mmHg — ABNORMAL LOW (ref 32.0–48.0)
pCO2 arterial: 33 mmHg (ref 32.0–48.0)
pH, Arterial: 7.6 — ABNORMAL HIGH (ref 7.350–7.450)
pH, Arterial: 7.52 — ABNORMAL HIGH (ref 7.350–7.450)
pH, Arterial: 7.23 — ABNORMAL LOW (ref 7.350–7.450)
pO2, Arterial: 145 mmHg — ABNORMAL HIGH (ref 83.0–108.0)
pO2, Arterial: 132 mmHg — ABNORMAL HIGH (ref 83.0–108.0)
pO2, Arterial: 201 mmHg — ABNORMAL HIGH (ref 83.0–108.0)

## 2020-01-04 LAB — CBC WITH DIFFERENTIAL/PLATELET
Abs Immature Granulocytes: 0.02 10*3/uL (ref 0.00–0.07)
Basophils Absolute: 0 10*3/uL (ref 0.0–0.1)
Basophils Relative: 0 %
Eosinophils Absolute: 0.1 10*3/uL (ref 0.0–0.5)
Eosinophils Relative: 2 %
HCT: 34.7 % — ABNORMAL LOW (ref 39.0–52.0)
Hemoglobin: 11.3 g/dL — ABNORMAL LOW (ref 13.0–17.0)
Immature Granulocytes: 0 %
Lymphocytes Relative: 19 %
Lymphs Abs: 1.3 10*3/uL (ref 0.7–4.0)
MCH: 30.1 pg (ref 26.0–34.0)
MCHC: 32.6 g/dL (ref 30.0–36.0)
MCV: 92.5 fL (ref 80.0–100.0)
Monocytes Absolute: 0.3 10*3/uL (ref 0.1–1.0)
Monocytes Relative: 5 %
Neutro Abs: 5.3 10*3/uL (ref 1.7–7.7)
Neutrophils Relative %: 74 %
Platelets: 70 10*3/uL — ABNORMAL LOW (ref 150–400)
RBC: 3.75 MIL/uL — ABNORMAL LOW (ref 4.22–5.81)
RDW: 18.4 % — ABNORMAL HIGH (ref 11.5–15.5)
WBC: 7.1 10*3/uL (ref 4.0–10.5)
nRBC: 0 % (ref 0.0–0.2)

## 2020-01-04 LAB — POTASSIUM: Potassium: 2.8 mmol/L — ABNORMAL LOW (ref 3.5–5.1)

## 2020-01-04 LAB — CBC
HCT: 35.3 % — ABNORMAL LOW (ref 39.0–52.0)
Hemoglobin: 11 g/dL — ABNORMAL LOW (ref 13.0–17.0)
MCH: 30.1 pg (ref 26.0–34.0)
MCHC: 31.2 g/dL (ref 30.0–36.0)
MCV: 96.7 fL (ref 80.0–100.0)
Platelets: 54 10*3/uL — ABNORMAL LOW (ref 150–400)
RBC: 3.65 MIL/uL — ABNORMAL LOW (ref 4.22–5.81)
RDW: 18.6 % — ABNORMAL HIGH (ref 11.5–15.5)
WBC: 7 10*3/uL (ref 4.0–10.5)
nRBC: 0 % (ref 0.0–0.2)

## 2020-01-04 LAB — GLUCOSE, CAPILLARY
Glucose-Capillary: 143 mg/dL — ABNORMAL HIGH (ref 70–99)
Glucose-Capillary: 112 mg/dL — ABNORMAL HIGH (ref 70–99)
Glucose-Capillary: 101 mg/dL — ABNORMAL HIGH (ref 70–99)
Glucose-Capillary: 97 mg/dL (ref 70–99)
Glucose-Capillary: 113 mg/dL — ABNORMAL HIGH (ref 70–99)
Glucose-Capillary: 119 mg/dL — ABNORMAL HIGH (ref 70–99)
Glucose-Capillary: 142 mg/dL — ABNORMAL HIGH (ref 70–99)
Glucose-Capillary: 126 mg/dL — ABNORMAL HIGH (ref 70–99)

## 2020-01-04 LAB — COMPREHENSIVE METABOLIC PANEL
ALT: 8 U/L (ref 0–44)
AST: 31 U/L (ref 15–41)
Albumin: 1.7 g/dL — ABNORMAL LOW (ref 3.5–5.0)
Alkaline Phosphatase: 63 U/L (ref 38–126)
Anion gap: 17 — ABNORMAL HIGH (ref 5–15)
BUN: 32 mg/dL — ABNORMAL HIGH (ref 6–20)
CO2: 37 mmol/L — ABNORMAL HIGH (ref 22–32)
Calcium: 6.5 mg/dL — ABNORMAL LOW (ref 8.9–10.3)
Chloride: 93 mmol/L — ABNORMAL LOW (ref 98–111)
Creatinine, Ser: 5.2 mg/dL — ABNORMAL HIGH (ref 0.61–1.24)
GFR calc Af Amer: 14 mL/min — ABNORMAL LOW (ref >60)
GFR calc non Af Amer: 12 mL/min — ABNORMAL LOW (ref >60)
Glucose, Bld: 250 mg/dL — ABNORMAL HIGH (ref 70–99)
Potassium: 4.5 mmol/L (ref 3.5–5.1)
Sodium: 147 mmol/L — ABNORMAL HIGH (ref 135–145)
Total Bilirubin: 1.2 mg/dL (ref 0.3–1.2)
Total Protein: 3.7 g/dL — ABNORMAL LOW (ref 6.5–8.1)

## 2020-01-04 LAB — BASIC METABOLIC PANEL
Anion gap: 15 (ref 5–15)
BUN: 35 mg/dL — ABNORMAL HIGH (ref 6–20)
CO2: 25 mmol/L (ref 22–32)
Calcium: 6.8 mg/dL — ABNORMAL LOW (ref 8.9–10.3)
Chloride: 92 mmol/L — ABNORMAL LOW (ref 98–111)
Creatinine, Ser: 5.82 mg/dL — ABNORMAL HIGH (ref 0.61–1.24)
GFR calc Af Amer: 12 mL/min — ABNORMAL LOW (ref >60)
GFR calc non Af Amer: 11 mL/min — ABNORMAL LOW (ref >60)
Glucose, Bld: 123 mg/dL — ABNORMAL HIGH (ref 70–99)
Potassium: 2.6 mmol/L — CL (ref 3.5–5.1)
Sodium: 132 mmol/L — ABNORMAL LOW (ref 135–145)

## 2020-01-04 LAB — PHOSPHORUS: Phosphorus: 6.5 mg/dL — ABNORMAL HIGH (ref 2.5–4.6)

## 2020-01-04 LAB — PROCALCITONIN: Procalcitonin: 2.26 ng/mL

## 2020-01-04 LAB — TROPONIN I (HIGH SENSITIVITY): Troponin I (High Sensitivity): 235 ng/L (ref <18)

## 2020-01-04 LAB — MAGNESIUM: Magnesium: 1.8 mg/dL (ref 1.7–2.4)

## 2020-01-04 MED ORDER — MIDODRINE HCL 5 MG PO TABS
5.0000 mg | ORAL_TABLET | Freq: Three times a day (TID) | ORAL | Status: DC
  Administered 2020-01-04 (×2): 5 mg via ORAL
  Filled 2020-01-04 (×2): qty 1

## 2020-01-04 MED ORDER — DOPAMINE-DEXTROSE 3.2-5 MG/ML-% IV SOLN: 0.0000 ug/kg/min | INTRAVENOUS | Status: DC

## 2020-01-04 MED ORDER — SODIUM CHLORIDE 0.9 % IV SOLN
INTRAVENOUS | Status: DC | PRN
  Administered 2020-01-04: 250 mL via INTRAVENOUS

## 2020-01-04 MED ORDER — B COMPLEX-C PO TABS
1.0000 | ORAL_TABLET | Freq: Every day | ORAL | Status: DC
  Administered 2020-01-04: 1
  Filled 2020-01-04: qty 1

## 2020-01-04 MED ORDER — POTASSIUM CHLORIDE 10 MEQ/50ML IV SOLN
10.0000 meq | INTRAVENOUS | Status: AC
  Administered 2020-01-04 (×4): 10 meq via INTRAVENOUS
  Filled 2020-01-04 (×4): qty 50

## 2020-01-04 MED ORDER — HYDROCORTISONE NA SUCCINATE PF 100 MG IJ SOLR
50.0000 mg | Freq: Four times a day (QID) | INTRAMUSCULAR | Status: DC
  Administered 2020-01-04 (×3): 50 mg via INTRAVENOUS
  Filled 2020-01-04 (×3): qty 2

## 2020-01-04 MED ORDER — FENTANYL BOLUS VIA INFUSION: 50.0000 ug | INTRAVENOUS | Status: DC | PRN

## 2020-01-04 MED ORDER — SODIUM CHLORIDE 0.9 % IV SOLN
1.0000 ug/kg/min | INTRAVENOUS | Status: DC
  Filled 2020-01-04: qty 20

## 2020-01-04 MED ORDER — SODIUM CHLORIDE 0.9 % IV SOLN: INTRAVENOUS | Status: DC

## 2020-01-04 MED ORDER — DOPAMINE-DEXTROSE 3.2-5 MG/ML-% IV SOLN
INTRAVENOUS | Status: AC
  Administered 2020-01-04: 20 ug/kg/min via INTRAVENOUS
  Filled 2020-01-04: qty 250

## 2020-01-04 MED ORDER — ORAL CARE MOUTH RINSE
15.0000 mL | OROMUCOSAL | Status: DC
  Administered 2020-01-04: 15 mL via OROMUCOSAL

## 2020-01-04 MED ORDER — ETOMIDATE 2 MG/ML IV SOLN
INTRAVENOUS | Status: AC
  Filled 2020-01-04: qty 10

## 2020-01-04 MED ORDER — FENTANYL CITRATE (PF) 100 MCG/2ML IJ SOLN: 100.0000 ug | Freq: Once | INTRAMUSCULAR | Status: DC

## 2020-01-04 MED ORDER — SODIUM BICARBONATE 8.4 % IV SOLN: INTRAVENOUS | Status: DC

## 2020-01-04 MED ORDER — SODIUM CHLORIDE 0.9% FLUSH
10.0000 mL | Freq: Two times a day (BID) | INTRAVENOUS | Status: DC
  Administered 2020-01-04: 10 mL

## 2020-01-04 MED ORDER — FENTANYL 2500MCG IN NS 250ML (10MCG/ML) PREMIX INFUSION
INTRAVENOUS | Status: AC
  Filled 2020-01-04: qty 250

## 2020-01-04 MED ORDER — CHLORHEXIDINE GLUCONATE 0.12% ORAL RINSE (MEDLINE KIT)
15.0000 mL | Freq: Two times a day (BID) | OROMUCOSAL | Status: DC
  Administered 2020-01-04: 15 mL via OROMUCOSAL

## 2020-01-04 MED ORDER — SODIUM BICARBONATE 8.4 % IV SOLN
INTRAVENOUS | Status: AC
  Filled 2020-01-04: qty 50

## 2020-01-04 MED ORDER — PHENYLEPHRINE CONCENTRATED 100MG/250ML (0.4 MG/ML) INFUSION SIMPLE
0.0000 ug/min | INTRAVENOUS | Status: DC
  Administered 2020-01-04: 400 ug/min via INTRAVENOUS
  Filled 2020-01-04: qty 250

## 2020-01-04 MED ORDER — ALBUMIN HUMAN 25 % IV SOLN
12.5000 g | Freq: Every day | INTRAVENOUS | Status: DC
  Administered 2020-01-04: 12.5 g via INTRAVENOUS
  Filled 2020-01-04: qty 50

## 2020-01-04 MED ORDER — SODIUM CHLORIDE 0.9% FLUSH: 10.0000 mL | INTRAVENOUS | Status: DC | PRN

## 2020-01-04 MED ORDER — ETOMIDATE 2 MG/ML IV SOLN: 20.0000 mg | Freq: Once | INTRAVENOUS | Status: DC

## 2020-01-04 MED ORDER — SODIUM BICARBONATE 8.4 % IV SOLN
100.0000 meq | Freq: Once | INTRAVENOUS | Status: AC
  Administered 2020-01-04: 100 meq via INTRAVENOUS

## 2020-01-04 MED ORDER — NOREPINEPHRINE 16 MG/250ML-% IV SOLN
0.0000 ug/min | INTRAVENOUS | Status: DC
  Administered 2020-01-04: 15 ug/min via INTRAVENOUS
  Filled 2020-01-04: qty 250

## 2020-01-04 MED ORDER — ROCURONIUM BROMIDE 50 MG/5ML IV SOLN: 50.0000 mg | Freq: Once | INTRAVENOUS | Status: DC

## 2020-01-04 MED ORDER — CISATRACURIUM BOLUS VIA INFUSION
0.0500 mg/kg | INTRAVENOUS | Status: DC | PRN
  Filled 2020-01-04: qty 2

## 2020-01-04 MED ORDER — ROCURONIUM BROMIDE 50 MG/5ML IV SOLN
INTRAVENOUS | Status: AC
  Filled 2020-01-04: qty 1

## 2020-01-04 MED ORDER — CISATRACURIUM BOLUS VIA INFUSION
0.1000 mg/kg | Freq: Once | INTRAVENOUS | Status: DC
  Filled 2020-01-04: qty 4

## 2020-01-04 MED ORDER — PROPOFOL 1000 MG/100ML IV EMUL: 25.0000 ug/kg/min | INTRAVENOUS | Status: DC

## 2020-01-04 MED ORDER — FENTANYL 2500MCG IN NS 250ML (10MCG/ML) PREMIX INFUSION
0.0000 ug/h | INTRAVENOUS | Status: DC
  Administered 2020-01-04: 150 ug/h via INTRAVENOUS

## 2020-01-04 MED ORDER — LACTATED RINGERS IV BOLUS
1000.0000 mL | Freq: Once | INTRAVENOUS | Status: AC
  Administered 2020-01-04: 1000 mL via INTRAVENOUS

## 2020-01-04 MED ORDER — SODIUM BICARBONATE-DEXTROSE 150-5 MEQ/L-% IV SOLN
150.0000 meq | INTRAVENOUS | Status: DC
  Administered 2020-01-04: 150 meq via INTRAVENOUS
  Filled 2020-01-04: qty 1000

## 2020-01-04 MED ORDER — FENTANYL 2500MCG IN NS 250ML (10MCG/ML) PREMIX INFUSION: 100.0000 ug/h | INTRAVENOUS | Status: DC

## 2020-01-04 MED ORDER — ARTIFICIAL TEARS OPHTHALMIC OINT
1.0000 "application " | TOPICAL_OINTMENT | Freq: Three times a day (TID) | OPHTHALMIC | Status: DC
  Administered 2020-01-04: 1 via OPHTHALMIC
  Filled 2020-01-04: qty 3.5

## 2020-01-04 NOTE — Progress Notes (Signed)
Follow up - Critical Care Medicine Note  Patient Details:    Brett Small is an 44 y.o. male.with ESRD on HD admitted s/p in hospital PEA arrest with hypotension, acute hypoxic respiratory failure in the setting of severe ascites, possible RLL pneumonia and severe metabolic acidosis requiring intubation and mechanical ventilation.  Patient also requiring levophed gtt .  Lines, Airways, Drains: Airway 7.5 mm (Active)  Secured at (cm) 22 cm 01/03/20 1206  Measured From Lips 01/03/20 1600  Secured Location Right 01/03/20 1600  Secured By [employer] 01/03/20 1600  Tube Holder Repositioned Yes 01/03/20 1600  Cuff Pressure (cm H2O) 28 cm H2O 01/03/20 1206  Site Condition Dry 01/03/20 1600     NG/OG Tube Orogastric 14 Fr. Right mouth Aucultation Measured external length of tube 55 cm (Active)  Site Assessment Clean;Dry;Intact 01/03/20 1600  Status Clamped 01/03/20 1600    Anti-infectives:  Anti-infectives (From admission, onward)   Start     Dose/Rate Route Frequency Ordered Stop   01/03/20 2000  azithromycin (ZITHROMAX) 500 mg in sodium chloride 0.9 % 250 mL IVPB     500 mg 250 mL/hr over 60 Minutes Intravenous Every 24 hours 01/07/2020 1952 01/09/20 1959   01/03/20 2000  cefTRIAXone (ROCEPHIN) 1 g in sodium chloride 0.9 % 100 mL IVPB     1 g 200 mL/hr over 30 Minutes Intravenous Every 24 hours 12/28/2019 1952 01/09/20 1959   01/18/2020 1715  cefTRIAXone (ROCEPHIN) 1 g in sodium chloride 0.9 % 100 mL IVPB     1 g 200 mL/hr over 30 Minutes Intravenous  Once 01/19/2020 1704 01/09/2020 1809   01/10/2020 1715  azithromycin (ZITHROMAX) 500 mg in sodium chloride 0.9 % 250 mL IVPB     500 mg 250 mL/hr over 60 Minutes Intravenous  Once 12/25/2019 1704 01/10/2020 1900      Microbiology: Results for orders placed or performed during the hospital encounter of 01/06/2020  SARS CORONAVIRUS 2 (TAT 6-24 HRS) Nasopharyngeal Nasopharyngeal Swab     Status: None   Collection Time: 01/20/2020  2:21 PM    Specimen: Nasopharyngeal Swab  Result Value Ref Range Status   SARS Coronavirus 2 NEGATIVE NEGATIVE Final    Comment: (NOTE) SARS-CoV-2 target nucleic acids are NOT DETECTED. The SARS-CoV-2 RNA is generally detectable in upper and lower respiratory specimens during the acute phase of infection. Negative results do not preclude SARS-CoV-2 infection, do not rule out co-infections with other pathogens, and should not be used as the sole basis for treatment or other patient management decisions. Negative results must be combined with clinical observations, patient history, and epidemiological information. The expected result is Negative. Fact Sheet for Patients: SugarRoll.be Fact Sheet for Healthcare Providers: https://www.woods-mathews.com/ This test is not yet approved or cleared by the [place] FDA and  has been authorized for detection and/or diagnosis of SARS-CoV-2 by FDA under an Emergency Use Authorization (EUA). This EUA will remain  in effect (meaning this test can be used) for the duration of the COVID-19 declaration under Section 56 4(b)(1) of the Act, 21 U.S.C. section 360bbb-3(b)(1), unless the authorization is terminated or revoked sooner. Performed at [facility], [place] [address], [place], [place] [address]   MRSA PCR Screening     Status: None   Collection Time: 12/21/2019  9:12 PM   Specimen: Nasopharyngeal  Result Value Ref Range Status   MRSA by PCR NEGATIVE NEGATIVE Final    Comment:        The GeneXpert MRSA  Assay (FDA approved for NASAL specimens only), is one component of a comprehensive MRSA colonization surveillance program. It is not intended to diagnose MRSA infection nor to guide or monitor treatment for MRSA infections. Performed at [facility], [address] [place], [place] [address]   CULTURE, BLOOD (ROUTINE X 2) w Reflex to ID Panel     Status: None (Preliminary result)   Collection  Time: 01/03/20  6:26 AM   Specimen: BLOOD RIGHT HAND  Result Value Ref Range Status   Specimen Description BLOOD RIGHT HAND  Final   Special Requests   Final    BOTTLES DRAWN AEROBIC AND ANAEROBIC Blood Culture adequate volume   Culture   Final    NO GROWTH < 24 HOURS Performed at [facility], [address], [place], [place] [address]    Report Status PENDING  Incomplete  CULTURE, BLOOD (ROUTINE X 2) w Reflex to ID Panel     Status: None (Preliminary result)   Collection Time: 01/03/20  7:07 AM   Specimen: BLOOD  Result Value Ref Range Status   Specimen Description BLOOD RHAND  Final   Special Requests   Final    BOTTLES DRAWN AEROBIC AND ANAEROBIC Blood Culture adequate volume   Culture   Final    NO GROWTH < 24 HOURS Performed at [facility], [address], [place], [place] [address]    Report Status PENDING  Incomplete    Best Practice/Protocols:  VTE Prophylaxis: Heparin (SQ) GI Prophylaxis: Antihistamine Continous Sedation  Events: 03/13: Pt admitted to ICU mechanically ventilated post PEA arrest 03/12: CT Abd Pelvis revealed large amount of ascites, increased when compared to the prior CT. Patchy ground-glass opacities at the right lung base. Consider pneumonia if there are consistent clinical findings. Left lung base opacity is more consistent with atelectasis. Small left and minimal right pleural effusions. Small pericardial effusion. Marked renal atrophy consistent with end-stage renal disease. Significant atherosclerotic disease with aortic, superior mesenteric artery and right common iliac artery stents, stable from the prior CT. 3/13: Echocardiogram interpretation by Dr. Jackelyn Hoehn: Left apical thrombus.  Left ventricular ejection fraction is less than 20% (previous 25 to 30%), grade 2 diastolic dysfunction.  Right ventricular systolic function is severely reduced.  Moderate mitral valve regurgitation tricuspid regurgitation. 3/15 - discussed  with nephrology -Dr Juleen China-  plan for HD tommorow, weaning MV setting and preparing for poss SBT today.   Studies: CT ABDOMEN PELVIS WO CONTRAST  Result Date: 12/29/2019 CLINICAL DATA:  Per RN note in pt chart "Pt arrives via [facility] from home for generalized weakness since he was "last discharged". Pt d/c from here on 02/24. Pt reports he has been having trouble walking with fatigue but no dizziness. Reports he has been going to dialysis regularly and was supposed to go today as well. Reports pain 7/10 in both legs, NAD noted, A&Ox4. Pt reports nausea and diarrhea" EXAM: CT ABDOMEN AND PELVIS WITHOUT CONTRAST TECHNIQUE: Multidetector CT imaging of the abdomen and pelvis was performed following the standard protocol without IV contrast. COMPARISON:  07/20/2019 FINDINGS: Lower chest: Small left and minimal right pleural effusions. Small pericardial effusion. There is hazy ground-glass opacity at the right lung base. More confluent opacity is noted in the left lower lobe and inferolateral left upper lobe lingula consistent with atelectasis. Hepatobiliary: Liver normal in size and attenuation. No masses. Some increased attenuation in the gallbladder which could reflect small stones. No convincing acute cholecystitis or bile duct dilation. Pancreas: Unremarkable. No  pancreatic ductal dilatation or surrounding inflammatory changes. Spleen: Normal in size without focal abnormality. Adrenals/Urinary Tract: No adrenal masses. Marked bilateral renal atrophy. No hydronephrosis. Ureters not well visualized. No ureteral dilation. Bladder is decompressed. Stomach/Bowel: No bowel dilation to suggest obstruction. No wall thickening or convincing inflammation. Normal stomach. Vascular/Lymphatic: Superior mesenteric artery, aortic and right common iliac artery stents. No discrete enlarged lymph nodes. Reproductive: Unremarkable. Other: Large amount of ascites distends the abdomen. Musculoskeletal: Chronic bilateral pars defects  at L5-S1 with a grade 1 anterolisthesis. No acute fracture. No osteoblastic or osteolytic lesions. IMPRESSION: 1. Large amount of ascites, increased when compared to the prior CT. 2. Patchy ground-glass opacities at the right lung base. Consider pneumonia if there are consistent clinical findings. Left lung base opacity is more consistent with atelectasis. 3. Small left and minimal right pleural effusions. Small pericardial effusion. 4. Marked renal atrophy consistent with end-stage renal disease. 5. Significant atherosclerotic disease with aortic, superior mesenteric artery and right common iliac artery stents, stable from the prior CT. Electronically Signed   By: Lajean Manes M.D.   On: 12/21/2019 14:57   DG Chest 1 View  Result Date: 01/16/2020 CLINICAL DATA:  44 year old male status post intubation. EXAM: CHEST  1 VIEW COMPARISON:  Earlier radiograph dated 01/09/2020. FINDINGS: Endotracheal tube with tip approximately 6.7 cm above the carina. Enteric tube extends below the diaphragm with tip in the body of the stomach. Dialysis catheter in similar position. No significant interval change in the appearance of the lungs or cardiomediastinal silhouette since the earlier radiograph. IMPRESSION: Interval placement of an endotracheal tube with tip above the carina and enteric tube with tip projecting over the body of the stomach. Electronically Signed   By: Anner Crete M.D.   On: 01/19/2020 19:45   DG Chest 1 View  Result Date: 01/01/2020 CLINICAL DATA:  Weakness. Recent pneumonia. EXAM: CHEST  1 VIEW COMPARISON:  12/15/2019 prior radiographs FINDINGS: Cardiomegaly and LEFT IJ central venous catheter with tip overlying the UPPER RIGHT atrium again noted. Small LEFT pleural effusion and LEFT basilar atelectasis/opacity again noted. A LEFT axillary vascular stent is present. Mild pulmonary vascular congestion is present. No pneumothorax or acute bony. IMPRESSION: Unchanged chest radiograph with  cardiomegaly, mild pulmonary vascular congestion and small LEFT pleural effusion/LEFT basilar opacity. Electronically Signed   By: Margarette Canada M.D.   On: 01/14/2020 13:18   PERIPHERAL VASCULAR CATHETERIZATION  Result Date: 12/16/2019 See op note  PERIPHERAL VASCULAR CATHETERIZATION  Result Date: 12/15/2019 See op note  DG Chest Portable 1 View  Result Date: 12/15/2019 CLINICAL DATA:  Dialysis catheter EXAM: PORTABLE CHEST 1 VIEW COMPARISON:  10/28/2019 FINDINGS: Right dialysis catheter tip overlies right atrium. Small left pleural effusion and left basilar atelectasis. Stable heart size. IMPRESSION: Right dialysis catheter tip overlies right atrium. Small left pleural effusion and left basilar atelectasis. Electronically Signed   By: Macy Mis M.D.   On: 12/15/2019 13:48   VAS [place] UPPER EXTREMITY ARTERIAL DUPLEX  Result Date: 12/29/2019 UPPER EXTREMITY DUPLEX STUDY Indications: Patient complains of Status Post Rt Arm Angioplasty.  Other Factors: 11/02/2019 right brachial and ulnar pta with dis brach stent. Comparison Study: 11/13/2019 Performing Technologist: Almira Coaster RVS  Examination Guidelines: A complete evaluation includes B-mode imaging, spectral Doppler, color Doppler, and power Doppler as needed of all accessible portions of each vessel. Bilateral testing is considered an integral part of a complete examination. Limited examinations for reoccurring indications may be performed as noted.  Right Doppler Findings: +---------------+----------+---------+--------+--------+ Site  PSV (cm/s)Waveform StenosisComments +---------------+----------+---------+--------+--------+ Subclavian Prox15        triphasic                 +---------------+----------+---------+--------+--------+ Subclavian Dist          biphasic                  +---------------+----------+---------+--------+--------+ Axillary       0.14      triphasic                  +---------------+----------+---------+--------+--------+ Brachial Prox  12        triphasic                 +---------------+----------+---------+--------+--------+ Brachial Mid   47        triphasic                 +---------------+----------+---------+--------+--------+ Brachial Dist  37        triphasic                 +---------------+----------+---------+--------+--------+ Radial Prox    30        biphasic                  +---------------+----------+---------+--------+--------+ Radial Dist    33        biphasic                  +---------------+----------+---------+--------+--------+ Ulnar Prox     21        biphasic                  +---------------+----------+---------+--------+--------+   Summary:  Right: Imaging and Waveforms obtained throughout in the Right Upper        Extremity. Compared to prior study on 11/13/2019 Waveforms        have improved. *See table(s) above for measurements and observations. Electronically signed by Leotis Pain MD on 12/29/2019 at 4:36:49 PM.    Final    ECHOCARDIOGRAM COMPLETE  Result Date: 01/03/2020    ECHOCARDIOGRAM REPORT   Patient Name:   AYINDE SWIM Date of Exam: 01/03/2020 Medical Rec #:  [id number]      Height:       61.0 in Accession #:    [id number]     Weight:       87.7 lb Date of Birth:  1976/06/13      BSA:          1.331 m Patient Age:    57 years       BP:           113/87 mmHg Patient Gender: M              HR:           84 bpm. Exam Location:  [facility] Procedure: 2D Echo and Intracardiac Opacification Agent Indications:     Pericardial Effusion  History:         Patient has prior history of Echocardiogram examinations. Risk                  Factors:Hypertension and ESRD.  Sonographer:     LTM Referring Phys:  [id number] CARMEN Veda Canning Diagnosing Phys: Kate Sable MD IMPRESSIONS  1. There is a 7 x 5 mm thrombus in the LV apex. Left ventricular ejection fraction, by estimation, is <20%. The left ventricle has severely  decreased function. The left ventricle demonstrates global hypokinesis. Left ventricular diastolic parameters are  consistent with Grade  II diastolic dysfunction (pseudonormalization).  2. Right ventricular systolic function is severely reduced. The right ventricular size is normal.  3. Left atrial size was moderately dilated.  4. Right atrial size was mildly dilated.  5. The pericardial effusion is circumferential. Large pleural effusion in the left lateral region.  6. The mitral valve is grossly normal. Moderate mitral valve regurgitation.  7. Tricuspid valve regurgitation is mild to moderate.  8. The aortic valve is tricuspid. Aortic valve regurgitation is trivial. Conclusion(s)/Recommendation(s): Severe Biventricular dysfunction. Apical LV thombus present. Recommend anticoagulation with clinically appropriate. FINDINGS  Left Ventricle: There is a 7 x 5 mm thrombus in the LV apex. Left ventricular ejection fraction, by estimation, is <20%. The left ventricle has severely decreased function. The left ventricle demonstrates global hypokinesis. Definity contrast agent was given IV to delineate the left ventricular endocardial borders. The left ventricular internal cavity size was normal in size. There is no left ventricular hypertrophy. Left ventricular diastolic parameters are consistent with Grade II diastolic dysfunction (pseudonormalization). Right Ventricle: The right ventricular size is normal. Right vetricular wall thickness was not assessed. Right ventricular systolic function is severely reduced. Left Atrium: Left atrial size was moderately dilated. Right Atrium: Right atrial size was mildly dilated. Pericardium: A small pericardial effusion is present. The pericardial effusion is circumferential. Mitral Valve: The mitral valve is grossly normal. Moderate mitral valve regurgitation. Tricuspid Valve: The tricuspid valve is grossly normal. Tricuspid valve regurgitation is mild to moderate. Aortic Valve: The  aortic valve is tricuspid. Aortic valve regurgitation is trivial. Aortic regurgitation PHT measures 518 msec. Aortic valve mean gradient measures 2.0 mmHg. Aortic valve peak gradient measures 4.2 mmHg. Aortic valve area, by VTI measures  1.22 cm. Pulmonic Valve: The pulmonic valve was normal in structure. Pulmonic valve regurgitation is not visualized. Aorta: The aortic root is normal in size and structure. Venous: IVC assessment for right atrial pressure unable to be performed due to mechanical ventilation. IAS/Shunts: No atrial level shunt detected by color flow Doppler. Additional Comments: There is a large pleural effusion in the left lateral region.  LEFT VENTRICLE PLAX 2D LVIDd:         4.99 cm  Diastology LVIDs:         4.59 cm  LV e' lateral:   4.20 cm/s LV PW:         1.23 cm  LV E/e' lateral: 13.4 LV IVS:        0.93 cm  LV e' medial:    3.68 cm/s LVOT diam:     1.70 cm  LV E/e' medial:  15.3 LV SV:         18 LV SV Index:   13 LVOT Area:     2.27 cm  RIGHT VENTRICLE RV S prime:     5.70 cm/s TAPSE (M-mode): 1.1 cm LEFT ATRIUM             Index LA diam:        3.10 cm 2.33 cm/m LA Vol (A2C):   66.8 ml 50.20 ml/m LA Vol (A4C):   56.3 ml 42.31 ml/m LA Biplane Vol: 62.8 ml 47.19 ml/m  AORTIC VALVE AV Area (Vmax):    1.38 cm AV Area (Vmean):   1.36 cm AV Area (VTI):     1.22 cm AV Vmax:           102.00 cm/s AV Vmean:          68.400 cm/s AV VTI:  0.147 m AV Peak Grad:      4.2 mmHg AV Mean Grad:      2.0 mmHg LVOT Vmax:         62.10 cm/s LVOT Vmean:        41.100 cm/s LVOT VTI:          0.079 m LVOT/AV VTI ratio: 0.54 AI PHT:            518 msec  AORTA Ao Root diam: 2.80 cm MITRAL VALVE               TRICUSPID VALVE MV Area (PHT): 4.15 cm    TR Peak grad:   31.6 mmHg MV E velocity: 56.30 cm/s  TR Vmax:        281.00 cm/s MV A velocity: 50.90 cm/s MV E/A ratio:  1.11        SHUNTS                            Systemic VTI:  0.08 m                            Systemic Diam: 1.70 cm Kate Sable MD Electronically signed by Kate Sable MD Signature Date/Time: 01/03/2020/4:01:27 PM    Final     Consults: Treatment Team:  Pccm, Ander Gaster, MD Lavonia Dana, MD   Subjective:    Overnight Issues: Remains on ventilator postarrest, awakening some, follows commands erratically.  Still very weak. Objective:  Vital signs for last 24 hours: Temp:  [94.6 F (34.8 C)-98.2 F (36.8 C)] 94.8 F (34.9 C) (03/15 0900) Pulse Rate:  [68-99] 78 (03/15 0900) Resp:  [16-20] 16 (03/15 0900) BP: (83-115)/(48-95) 115/48 (03/15 0900) SpO2:  [82 %-100 %] 95 % (03/15 0900) Arterial Line BP: (86-105)/(65-81) 105/81 (03/15 0900) FiO2 (%):  [21 %-100 %] 28 % (03/15 0819)  Hemodynamic parameters for last 24 hours: CVP:  [7 mmHg-13 mmHg] 13 mmHg  Intake/Output from previous day: 03/14 0701 - 03/15 0700 In: 1727.2 [I.V.:1727.2] Out: -   Intake/Output this shift: Total I/O In: 85.1 [I.V.:85.1] Out: -   Vent settings for last 24 hours: Vent Mode: PRVC FiO2 (%):  [21 %-100 %] 28 % Set Rate:  [16 bmp-20 bmp] 16 bmp Vt Set:  [380 mL] 380 mL PEEP:  [5 cmH20] 5 cmH20 Plateau Pressure:  [16 cmH20-18 cmH20] 18 cmH20  Physical Exam:  General: acutely ill cachetic appearing male, very pale, intubated, mechanically ventilated, synchronous with the vent. Neuro: sedated, does awaken, erratic following of commands, PERRL HEENT: supple, + JVD  Cardiovascular: nsr, rrr, cannot appreciate murmur due to rhonchi, vent sounds, right IJ permcath present  Lungs: rhonchi throughout, no wheezes, even, non labored  Abdomen: +BS x4, soft, non distended.  No significant reaccumulation of ascites so far. Musculoskeletal: severely cachetic, no edema  Skin: intact no rashes or lesions present   Assessment/Plan:  Acute hypoxic respiratory failure secondary to severe metabolic acidosis, possible LLL pneumonia, and severe ascites (s/p paracentesis 12/28/2019 with 2L fluid removal) Mechanical  ventilation post cardiac arrest Full vent support for now-vent settings reviewed with RT SBT's once all parameters met  VAP bundle implemented  PRN bronchodilator therapy  -self cooling as core temp is 34.7 - mentation post ACLS was evaluated reassuring and ArticSun was cancelled -patient on 45mg levophed this am, plan for IVF 1L of LR, add home PO midordrine, d/c vasopressors,  start solucortef and extubate.  Will remove A line post MV liberation possibly today.   PEA arrest and hypotension  secondary to severe metabolic acidosis Elevated troponin's secondary to demand ischemia vs NSTEMI Severe cardiomyopathy, worse compared to prior Ascites likely cardiac ascites from failure of RV and poor LV function  Hx: HTN, DVT, PVD, and PAD Continuous telemetry monitoring  Trend troponin's  PRN levophed gtt to maintain map >65 Continue to hold outpatient antihypertensives for now    ESRD on HD  Severe metabolic acidosis  Lactic acidosis Trend BMP and lactic acid  Replace electrolytes as indicated  Avoid nephrotoxic medications  Will start sodium bicarb gtt _0  ml/hr, DC postdialysis Nephrology consulted appreciate input Patient to receive dialysis today  Possible RLL pneumonia  Trend WBC and monitor fever curve  Trend PCT  Follow cultures  Continue azithromycin and ceftriaxone   Chronic thrombocytopenia  Trend CBC  Transfuse for hgb <7 and/or active bleeding   Mechanical ventilation pain/discomfort  Maintain RASS goal -1 to -2 Propofol gtt and prn versed/fentanly to maintain RASS goal  WUA daily   Severe protein calorie malnutrition BMI 16.5 Nutritionist consultation    LOS: 2 days   Additional comments: We will need to reassess the patient's goals of care.  He has severe cardiomyopathy, multiple episodes of cardiac arrest, mural thrombus and RV failure this in the setting of severe protein calorie malnutrition/cachexia and ESRD.  Prognosis for long-term survival  poor.  Critical Care Total Time*: 32 Minutes   Ottie Glazier, M.D.  [facility] [place]   01/04/2020  *Care during the described time interval was provided by me and/or other providers on the critical care team.  I have reviewed this patient's available data, including medical history, events of note, physical examination and test results as part of my evaluation.  **This note was dictated using voice recognition software/Dragon.  Despite best efforts to proofread, errors can occur which can change the meaning.  Any change was purely unintentional.

## 2020-01-04 NOTE — Progress Notes (Signed)
Pt is alert and oriented now. Pt passed yale swallow test. Tolerating clear liquid diet. Pt confirms with this RN his wishes to be full code. Will continue to monitor.

## 2020-01-04 NOTE — Progress Notes (Addendum)
Shift summary:  - Patient is hypotensive and hypothermic this AM, placed on Bair hugger.  - Extubated to [place] this AM. Remains on pressors, Bair hugger.

## 2020-01-04 NOTE — Progress Notes (Signed)
PHARMACY - PHYSICIAN COMMUNICATION CRITICAL VALUE ALERT - BLOOD CULTURE IDENTIFICATION (BCID)  Brett Small is an 44 y.o. male who presented to [facility] on 01/04/2020  Assessment:  1/4 (aerobic) GPR, aerococcus species, likely contaminant  Name of physician (or Provider) Contacted: Dr. Lanney Gins  Current antibiotics: Rocephin/azithro  Changes to prescribed antibiotics recommended: no change   Tawnya Crook, PharmD 01/04/2020  3:38 PM

## 2020-01-04 NOTE — Progress Notes (Signed)
[facility]  ROUNDING NOTE   Subjective:   A-line and central line placed. Required vasopressin infusion overnight.  Now on norepinephrine gtt.   K 2.6   Tmin 94.6  Objective:  Vital signs in last 24 hours:  Temp:  [94.6 F (34.8 C)-98.2 F (36.8 C)] 94.8 F (34.9 C) (03/15 0900) Pulse Rate:  [68-99] 78 (03/15 0900) Resp:  [16-20] 16 (03/15 0900) BP: (83-115)/(48-95) 115/48 (03/15 0900) SpO2:  [82 %-100 %] 95 % (03/15 0900) Arterial Line BP: (86-105)/(65-81) 105/81 (03/15 0900) FiO2 (%):  [21 %-100 %] 28 % (03/15 0819)  Weight change:  Filed Weights   01/09/2020 1232 12/24/2019 2200  Weight: 45.4 kg 39.8 kg    Intake/Output: I/O last 3 completed shifts: In: 2973.5 [I.V.:2323.5; IV [id number]:921] Out: 5 [Urine:5]   Intake/Output this shift:  Total I/O In: 85.1 [I.V.:85.1] Out: -   Physical Exam: General: Critically illl, warming blanket  Head: ETT OGT  Eyes: Anicteric  Neck:  trachea midline  Lungs:  Clear, PRVC FiO2 21%  Heart: Regular rate and rhythm  Abdomen:  Soft   Extremities: no peripheral edema.  Neurologic: Intubated, sedated  Skin: No lesions  Access: Right RIJ permcath    Basic Metabolic Panel: Recent Labs  Lab 01/12/2020 1358 01/13/2020 1916 01/03/20 0626 01/04/20 0619  NA 133*  --  136 132*  K 3.6  --  3.0* 2.6*  CL 91*  --  99 92*  CO2 22  --  24 25  GLUCOSE 95  --  116* 123*  BUN 32*  --  33* 35*  CREATININE 5.93* 6.01* 5.68* 5.82*  CALCIUM 9.0  --  7.8* 6.8*  MG  --   --  1.8 1.8  PHOS  --   --  7.3* 6.5*    Liver Function Tests: Recent Labs  Lab 12/21/2019 1358  AST 18  ALT 8  ALKPHOS 102  BILITOT 3.0*  PROT 6.6  ALBUMIN 2.9*   Recent Labs  Lab 12/28/2019 1358  LIPASE 23   No results for input(s): AMMONIA in the last 168 hours.  CBC: Recent Labs  Lab 12/31/2019 1358 01/18/2020 1916 01/03/20 0626 01/04/20 0619  WBC 7.7 7.6 5.5 7.1  NEUTROABS 5.8  --   --  5.3  HGB 13.9 14.1 10.7* 11.3*  HCT 43.8 47.5  32.3* 34.7*  MCV 95.2 102.8* 92.6 92.5  PLT 88* 73* 45* 70*    Cardiac Enzymes: No results for input(s): CKTOTAL, CKMB, CKMBINDEX, TROPONINI in the last 168 hours.  BNP: Invalid input(s): POCBNP  CBG: Recent Labs  Lab 01/03/20 1929 01/03/20 2328 01/04/20 0345 01/04/20 0627 01/04/20 0712  GLUCAP 86 103* 143* 112* 101*    Microbiology: Results for orders placed or performed during the hospital encounter of 12/29/2019  SARS CORONAVIRUS 2 (TAT 6-24 HRS) Nasopharyngeal Nasopharyngeal Swab     Status: None   Collection Time: 01/16/2020  2:21 PM   Specimen: Nasopharyngeal Swab  Result Value Ref Range Status   SARS Coronavirus 2 NEGATIVE NEGATIVE Final    Comment: (NOTE) SARS-CoV-2 target nucleic acids are NOT DETECTED. The SARS-CoV-2 RNA is generally detectable in upper and lower respiratory specimens during the acute phase of infection. Negative results do not preclude SARS-CoV-2 infection, do not rule out co-infections with other pathogens, and should not be used as the sole basis for treatment or other patient management decisions. Negative results must be combined with clinical observations, patient history, and epidemiological information. The expected result is Negative. Fact  Sheet for Patients: SugarRoll.be Fact Sheet for Healthcare Providers: https://www.woods-mathews.com/ This test is not yet approved or cleared by the [place] FDA and  has been authorized for detection and/or diagnosis of SARS-CoV-2 by FDA under an Emergency Use Authorization (EUA). This EUA will remain  in effect (meaning this test can be used) for the duration of the COVID-19 declaration under Section 56 4(b)(1) of the Act, 21 U.S.C. section 360bbb-3(b)(1), unless the authorization is terminated or revoked sooner. Performed at [facility], [place] [address] [place], [place] [address]   MRSA PCR Screening     Status: None   Collection Time:  01/14/2020  9:12 PM   Specimen: Nasopharyngeal  Result Value Ref Range Status   MRSA by PCR NEGATIVE NEGATIVE Final    Comment:        The GeneXpert MRSA Assay (FDA approved for NASAL specimens only), is one component of a comprehensive MRSA colonization surveillance program. It is not intended to diagnose MRSA infection nor to guide or monitor treatment for MRSA infections. Performed at [facility], [address] [place], [place] [address]   CULTURE, BLOOD (ROUTINE X 2) w Reflex to ID Panel     Status: None (Preliminary result)   Collection Time: 01/03/20  6:26 AM   Specimen: BLOOD RIGHT HAND  Result Value Ref Range Status   Specimen Description BLOOD RIGHT HAND  Final   Special Requests   Final    BOTTLES DRAWN AEROBIC AND ANAEROBIC Blood Culture adequate volume   Culture   Final    NO GROWTH < 24 HOURS Performed at [facility], [address], [place], [place] [address]    Report Status PENDING  Incomplete  CULTURE, BLOOD (ROUTINE X 2) w Reflex to ID Panel     Status: None (Preliminary result)   Collection Time: 01/03/20  7:07 AM   Specimen: BLOOD  Result Value Ref Range Status   Specimen Description BLOOD RHAND  Final   Special Requests   Final    BOTTLES DRAWN AEROBIC AND ANAEROBIC Blood Culture adequate volume   Culture   Final    NO GROWTH < 24 HOURS Performed at [facility], [address] [place], [place] [address]    Report Status PENDING  Incomplete    Coagulation Studies: No results for input(s): LABPROT, INR in the last 72 hours.  Urinalysis: No results for input(s): COLORURINE, LABSPEC, PHURINE, GLUCOSEU, HGBUR, BILIRUBINUR, KETONESUR, PROTEINUR, UROBILINOGEN, NITRITE, LEUKOCYTESUR in the last 72 hours.  Invalid input(s): APPERANCEUR    Imaging: CT ABDOMEN PELVIS WO CONTRAST  Result Date: 01/19/2020 CLINICAL DATA:  Per RN note in pt chart "Pt arrives via [facility] from home for generalized weakness since he was "last  discharged". Pt d/c from here on 02/24. Pt reports he has been having trouble walking with fatigue but no dizziness. Reports he has been going to dialysis regularly and was supposed to go today as well. Reports pain 7/10 in both legs, NAD noted, A&Ox4. Pt reports nausea and diarrhea" EXAM: CT ABDOMEN AND PELVIS WITHOUT CONTRAST TECHNIQUE: Multidetector CT imaging of the abdomen and pelvis was performed following the standard protocol without IV contrast. COMPARISON:  07/20/2019 FINDINGS: Lower chest: Small left and minimal right pleural effusions. Small pericardial effusion. There is hazy ground-glass opacity at the right lung base. More confluent opacity is noted in the left lower lobe and inferolateral left upper lobe lingula consistent with atelectasis. Hepatobiliary: Liver normal in size and attenuation. No masses. Some  increased attenuation in the gallbladder which could reflect small stones. No convincing acute cholecystitis or bile duct dilation. Pancreas: Unremarkable. No pancreatic ductal dilatation or surrounding inflammatory changes. Spleen: Normal in size without focal abnormality. Adrenals/Urinary Tract: No adrenal masses. Marked bilateral renal atrophy. No hydronephrosis. Ureters not well visualized. No ureteral dilation. Bladder is decompressed. Stomach/Bowel: No bowel dilation to suggest obstruction. No wall thickening or convincing inflammation. Normal stomach. Vascular/Lymphatic: Superior mesenteric artery, aortic and right common iliac artery stents. No discrete enlarged lymph nodes. Reproductive: Unremarkable. Other: Large amount of ascites distends the abdomen. Musculoskeletal: Chronic bilateral pars defects at L5-S1 with a grade 1 anterolisthesis. No acute fracture. No osteoblastic or osteolytic lesions. IMPRESSION: 1. Large amount of ascites, increased when compared to the prior CT. 2. Patchy ground-glass opacities at the right lung base. Consider pneumonia if there are consistent clinical  findings. Left lung base opacity is more consistent with atelectasis. 3. Small left and minimal right pleural effusions. Small pericardial effusion. 4. Marked renal atrophy consistent with end-stage renal disease. 5. Significant atherosclerotic disease with aortic, superior mesenteric artery and right common iliac artery stents, stable from the prior CT. Electronically Signed   By: Lajean Manes M.D.   On: 01/07/2020 14:57   DG Chest 1 View  Result Date: 01/13/2020 CLINICAL DATA:  44 year old male status post intubation. EXAM: CHEST  1 VIEW COMPARISON:  Earlier radiograph dated 01/11/2020. FINDINGS: Endotracheal tube with tip approximately 6.7 cm above the carina. Enteric tube extends below the diaphragm with tip in the body of the stomach. Dialysis catheter in similar position. No significant interval change in the appearance of the lungs or cardiomediastinal silhouette since the earlier radiograph. IMPRESSION: Interval placement of an endotracheal tube with tip above the carina and enteric tube with tip projecting over the body of the stomach. Electronically Signed   By: Anner Crete M.D.   On: 01/01/2020 19:45   DG Chest 1 View  Result Date: 01/07/2020 CLINICAL DATA:  Weakness. Recent pneumonia. EXAM: CHEST  1 VIEW COMPARISON:  12/15/2019 prior radiographs FINDINGS: Cardiomegaly and LEFT IJ central venous catheter with tip overlying the UPPER RIGHT atrium again noted. Small LEFT pleural effusion and LEFT basilar atelectasis/opacity again noted. A LEFT axillary vascular stent is present. Mild pulmonary vascular congestion is present. No pneumothorax or acute bony. IMPRESSION: Unchanged chest radiograph with cardiomegaly, mild pulmonary vascular congestion and small LEFT pleural effusion/LEFT basilar opacity. Electronically Signed   By: Margarette Canada M.D.   On: 01/20/2020 13:18   ECHOCARDIOGRAM COMPLETE  Result Date: 01/03/2020    ECHOCARDIOGRAM REPORT   Patient Name:   DORSIE BURICH Date of Exam:  01/03/2020 Medical Rec #:  [id number]      Height:       61.0 in Accession #:    [id number]     Weight:       87.7 lb Date of Birth:  02-08-1976      BSA:          1.331 m Patient Age:    30 years       BP:           113/87 mmHg Patient Gender: M              HR:           84 bpm. Exam Location:  [facility] Procedure: 2D Echo and Intracardiac Opacification Agent Indications:     Pericardial Effusion  History:         Patient has prior history  of Echocardiogram examinations. Risk                  Factors:Hypertension and ESRD.  Sonographer:     LTM Referring Phys:  [id number] CARMEN Veda Canning Diagnosing Phys: Kate Sable MD IMPRESSIONS  1. There is a 7 x 5 mm thrombus in the LV apex. Left ventricular ejection fraction, by estimation, is <20%. The left ventricle has severely decreased function. The left ventricle demonstrates global hypokinesis. Left ventricular diastolic parameters are  consistent with Grade II diastolic dysfunction (pseudonormalization).  2. Right ventricular systolic function is severely reduced. The right ventricular size is normal.  3. Left atrial size was moderately dilated.  4. Right atrial size was mildly dilated.  5. The pericardial effusion is circumferential. Large pleural effusion in the left lateral region.  6. The mitral valve is grossly normal. Moderate mitral valve regurgitation.  7. Tricuspid valve regurgitation is mild to moderate.  8. The aortic valve is tricuspid. Aortic valve regurgitation is trivial. Conclusion(s)/Recommendation(s): Severe Biventricular dysfunction. Apical LV thombus present. Recommend anticoagulation with clinically appropriate. FINDINGS  Left Ventricle: There is a 7 x 5 mm thrombus in the LV apex. Left ventricular ejection fraction, by estimation, is <20%. The left ventricle has severely decreased function. The left ventricle demonstrates global hypokinesis. Definity contrast agent was given IV to delineate the left ventricular endocardial borders. The left  ventricular internal cavity size was normal in size. There is no left ventricular hypertrophy. Left ventricular diastolic parameters are consistent with Grade II diastolic dysfunction (pseudonormalization). Right Ventricle: The right ventricular size is normal. Right vetricular wall thickness was not assessed. Right ventricular systolic function is severely reduced. Left Atrium: Left atrial size was moderately dilated. Right Atrium: Right atrial size was mildly dilated. Pericardium: A small pericardial effusion is present. The pericardial effusion is circumferential. Mitral Valve: The mitral valve is grossly normal. Moderate mitral valve regurgitation. Tricuspid Valve: The tricuspid valve is grossly normal. Tricuspid valve regurgitation is mild to moderate. Aortic Valve: The aortic valve is tricuspid. Aortic valve regurgitation is trivial. Aortic regurgitation PHT measures 518 msec. Aortic valve mean gradient measures 2.0 mmHg. Aortic valve peak gradient measures 4.2 mmHg. Aortic valve area, by VTI measures  1.22 cm. Pulmonic Valve: The pulmonic valve was normal in structure. Pulmonic valve regurgitation is not visualized. Aorta: The aortic root is normal in size and structure. Venous: IVC assessment for right atrial pressure unable to be performed due to mechanical ventilation. IAS/Shunts: No atrial level shunt detected by color flow Doppler. Additional Comments: There is a large pleural effusion in the left lateral region.  LEFT VENTRICLE PLAX 2D LVIDd:         4.99 cm  Diastology LVIDs:         4.59 cm  LV e' lateral:   4.20 cm/s LV PW:         1.23 cm  LV E/e' lateral: 13.4 LV IVS:        0.93 cm  LV e' medial:    3.68 cm/s LVOT diam:     1.70 cm  LV E/e' medial:  15.3 LV SV:         18 LV SV Index:   13 LVOT Area:     2.27 cm  RIGHT VENTRICLE RV S prime:     5.70 cm/s TAPSE (M-mode): 1.1 cm LEFT ATRIUM             Index LA diam:        3.10 cm 2.33  cm/m LA Vol (A2C):   66.8 ml 50.20 ml/m LA Vol (A4C):    56.3 ml 42.31 ml/m LA Biplane Vol: 62.8 ml 47.19 ml/m  AORTIC VALVE AV Area (Vmax):    1.38 cm AV Area (Vmean):   1.36 cm AV Area (VTI):     1.22 cm AV Vmax:           102.00 cm/s AV Vmean:          68.400 cm/s AV VTI:            0.147 m AV Peak Grad:      4.2 mmHg AV Mean Grad:      2.0 mmHg LVOT Vmax:         62.10 cm/s LVOT Vmean:        41.100 cm/s LVOT VTI:          0.079 m LVOT/AV VTI ratio: 0.54 AI PHT:            518 msec  AORTA Ao Root diam: 2.80 cm MITRAL VALVE               TRICUSPID VALVE MV Area (PHT): 4.15 cm    TR Peak grad:   31.6 mmHg MV E velocity: 56.30 cm/s  TR Vmax:        281.00 cm/s MV A velocity: 50.90 cm/s MV E/A ratio:  1.11        SHUNTS                            Systemic VTI:  0.08 m                            Systemic Diam: 1.70 cm Kate Sable MD Electronically signed by Kate Sable MD Signature Date/Time: 01/03/2020/4:01:27 PM    Final      Medications:   . azithromycin (ZITHROMAX) 500 MG IVPB (Vial-Mate [employer]or) Stopped (01/03/20 2201)  . cefTRIAXone (ROCEPHIN)  IV Stopped (01/03/20 2132)  . dextrose 75 mL/hr at 01/04/20 0800  . famotidine (PEPCID) IV    . norepinephrine (LEVOPHED) Adult infusion 4 mcg/min (01/04/20 0844)  . propofol (DIPRIVAN) infusion 15 mcg/kg/min (01/04/20 0800)  . vasopressin (PITRESSIN) infusion - *FOR SHOCK* 0.03 Units/min (01/04/20 0013)   . chlorhexidine gluconate (MEDLINE KIT)  15 mL Mouth Rinse BID  . Chlorhexidine Gluconate Cloth  6 each Topical Daily  . mouth rinse  15 mL Mouth Rinse 10 times per day  . sodium chloride flush  10-40 mL Intracatheter Q12H   bisacodyl, fentaNYL (SUBLIMAZE) injection, ipratropium-albuterol, midazolam, midazolam, sodium chloride flush  Assessment/ Plan:  Mr. Philemon Riedesel is a 44 y.o. white male with end stage renal disease on hemodialysis, hypertension, peripheral vascular disease, depression, tobacco abuse admitted on 01/08/2020 for cardiac arrest.   [place] TTS RIJ  catheter 39kg  1. End stage renal disease on hemodialysis: last dialysis was 3/9. No urgent indication for dialysis today.  - Plan for intermittent hemodialysis tomorrow when more hemodynamically stable.   2. Anemia of chronic kidney disease. Hemoglobin11.3 - hold EPO  3. Hypertension: Holding home regimen. Home regimen of amlodipine, carvedilol, and hydralazine.  Weaned off vasopressors - Continues to require norepinephrine.   4. Secondary hyperparathyroidism with hyperphosphatemia: Resume binders when able to take PO.   5. Acute respiratory failure requiring mechanical ventilation.     LOS: 2 Johncarlos Holtsclaw 3/15/20219:13 AM

## 2020-01-04 NOTE — Significant Event (Signed)
Code Blue called I arrived at bedside pt respiratory arrested, and subsequently PEA arrested @2201 .  Therefore, ACLS protocol initiated and pt required mechanical intubation with ROSC at 2213 (see code sheet for details). Post code cardiac rhythm sinus tachycardia, hr low 100's with prolonged Qtc. Pt initially unable to follow commands post code, therefore hypothermic protocol orders placed.  However pt able to follow commands during placement of cooling pads, therefore hypothermic protocol discontinued.  I spoke with pts father Brett Small to inform him of decline in pt condition and discussed code status/goals of treatment.  Following discussion pts father requested pts code status to be changed to DO NOT RESUSCITATE, therefore code status changed to DNR.  Post code ABG revealed metabolic acidosis, therefore sodium bicarb gtt initiated at 50 ml/hr.  Pt currently requiring 15 mcg/min of levophed gtt due to hypotension.  Case discussed with Dr. Lanney Gins.  Will continue to monitor and assess pt.  [name], [place] Pager [phone] (please enter 7 digits) PCCM Consult Pager [phone] (please enter 7 digits)

## 2020-01-04 NOTE — Significant Event (Signed)
Pt severely bradycardic hr 38-40's and remains hypotensive despite initiation of maximal doses of dopamine, levophed, and vasopressin gtts.  Notified pts father Jasdeep Kepner via telephone regarding decline in pts condition and informed him pt will likely not survive this hospitalization.  Mr. Bulman acknowledged and appreciated the update.  I informed him I would notify him if pt passes away.  Will continue to monitor and assess pt.    [name], [place] Pager [phone] (please enter 7 digits) PCCM Consult Pager [phone] (please enter 7 digits)

## 2020-01-04 NOTE — Progress Notes (Signed)
Pt extubated to 4Lnc. Pt tol well, will continue to monitor

## 2020-01-04 NOTE — Progress Notes (Addendum)
CSW called Educational psychologist. Confirmed patient is active with them for [place].   Oleh Genin, [place]

## 2020-01-04 NOTE — Progress Notes (Signed)
Initial Nutrition Assessment  DOCUMENTATION CODES:   Severe malnutrition in context of chronic illness  INTERVENTION:   If tube feeds initiated, recommend:  Vital 1.2 @45ml /hr  Free water flushes 66ml q4 hours to maintain tube patency   Regimen provides 1296kcal/day, 81g/day protein, 1012ml/day free water  B-Complex with C daily per tube  Liquid MVI daily via tube  Pt likely at refeed risk; recommend monitor K, Mg and P labs daily until stable.   NUTRITION DIAGNOSIS:   Severe Malnutrition related to chronic illness(ESRD on HD) as evidenced by severe fat depletion, severe muscle depletion, 17 percent weight loss in 1 month.  GOAL:   Provide needs based on ASPEN/SCCM guidelines  MONITOR:   Vent status, Labs, Weight trends, Skin, I & O's  REASON FOR ASSESSMENT:   Malnutrition Screening Tool, Ventilator    ASSESSMENT:   44 yo male with a PMH of Hyperparathyroidism, ESRD on HD, Noncompliant with HD due to Social Factors, Chronic Systolic CHF (EF 38-18% on 10/2019), PVD, PAD, Hospitalized from 10/27/2019 to 11/04/2019 with sepsis secondary to pneumonia (complicated by NSTEMI, cardiac arrested x2 with ROSC) Right Radial Vein DVT (on Eliquis), and HTN.  He presented to [facility] ER on 03/13 via EMS from home with generalized weakness and difficulty with ambulation due to fatigue. Pt now s/p in hospital PEA arrest with hypotension, acute hypoxic respiratory failure in the setting of severe ascites, possible RLL pneumonia and severe metabolic acidosis requiring mechanical intubation   Pt sedated and ventilated. OGT in place. Pt is well known to nutrition department and this RD from multiple previous admits. Pt with poor appetite and oral intake at baseline. 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 Pt with non-healing wound and widespread ecchymosis. Per chart, pt has lost 18lbs(17%) over the past month and has  developed severe malnutrition. No plans for tube feeds today; MD planning to attempt to extubate.   Medications reviewed and include: albumin, azithromycin, ceftriaxone, 5% dextrose @ 68ml/hr, pepcid, levophed, KCl, propofol (stopped), vasopressin   Labs reviewed: K 2.8(L), Cl 92(L), BUN 35(H), creat 5.82(H), Ca 6.8(L), P 6.5(H), Mg 1.8 wnl  Patient is currently intubated on ventilator support MV: 8.0 L/min Temp (24hrs), Avg:96.8 F (36 C), Min:94.6 F (34.8 C), Max:98.2 F (36.8 C)  Propofol: currently stopped   MAP- >71mmHg  NUTRITION - FOCUSED PHYSICAL EXAM:    Most Recent Value  Orbital Region  Mild depletion  Upper Arm Region  Severe depletion  Thoracic and Lumbar Region  Severe depletion  Buccal Region  Moderate depletion  Temple Region  Moderate depletion  Clavicle Bone Region  Severe depletion  Clavicle and Acromion Bone Region  Severe depletion  Scapular Bone Region  Severe depletion  Dorsal Hand  Mild depletion  Patellar Region  Severe depletion  Anterior Thigh Region  Severe depletion  Posterior Calf Region  Severe depletion  Edema (RD Assessment)  Mild  Hair  Reviewed  Eyes  Reviewed  Mouth  Reviewed  Skin  Reviewed [Ecchymosis]  Nails  Reviewed     Diet Order:   Diet Order            Diet NPO time specified  Diet effective now             EDUCATION NEEDS:   Not appropriate for education at this time  Skin:  Skin Assessment: Reviewed RN Assessment(Stage II buttocks)  Last BM:  pta  Height:   Ht Readings from Last 1 Encounters:  01/11/2020 5\' 1"  (1.549 m)    Weight:   Wt Readings from Last 1 Encounters:  12/28/2019 39.8 kg    Ideal Body Weight:  50.9 kg  BMI:  Body mass index is 16.58 kg/m.  Estimated Nutritional Needs:   Kcal:  1277kcal/day  Protein:  65-75g/day  Fluid:  UOP +1L  Koleen Distance MS, RD, LDN Contact information available in Amion

## 2020-01-07 LAB — CULTURE, BLOOD (ROUTINE X 2): Special Requests: ADEQUATE

## 2020-01-08 LAB — CULTURE, BLOOD (ROUTINE X 2)
Culture: NO GROWTH
Special Requests: ADEQUATE

## 2020-01-21 NOTE — Progress Notes (Signed)
RT x3 responded to code blue. Patient bagged during CPR, successfully intubated by ICU NP, placed on ventilator with previous settings. ABG drawn.

## 2020-01-21 NOTE — Death Summary Note (Signed)
DEATH SUMMARY   Patient Details  Name: Brett Small MRN: [id number] DOB: [birth year]  Admission/Discharge Information   Admit Date:  01-29-20  Date of Death:  02/01/20  Time of Death:  02/15/2023  Length of Stay: 3  Referring Physician: Patient, [name]   Reason(s) for Hospitalization  Generalized Weakness   Diagnoses  Preliminary cause of death: Acute on chronic congestive heart failure with right ventricular diastolic dysfunction ([place]) Secondary Diagnoses (including complications and co-morbidities):  Active Problems:   Generalized weakness   Protein-calorie malnutrition, severe   Acute hypoxic respiratory failure requiring mechanical ventilation x2   Severe metabolic acidosis    Lactic acidosis    LLL pneumonia    Severe Ascites likely cardiac ascites from failure of RV and poor LV function    PEA arrest x2   Hypotension secondary to severe metabolic acidosis    Elevated troponin's secondary to demand ischemia vs. NSTEMI   Severe cardiomyopathy    Peripheral vascular disease    Chronic thrombocytopenia    Severe protein calorie malnutrition      Brief Hospital Course (including significant findings, care, treatment, and services provided and events leading to death)  Godfrey Tritschler is a 44 y.o. year old male ESRD on HD admitted s/p in hospital PEA arrest with hypotension, acute hypoxic respiratory failure in the setting of severe ascites, possibleRLL pneumonia and severe metabolic acidosis requiring intubation and mechanical ventilation.  He was successfully extubated on 01/04/2020, but continued to requiring low dose levophed gtt.  However, the night of 01/04/2020 pt developed severe acute hypoxic respiratory failure followed by PEA arrest @2201 .  Therefore, CPR and ACLS protocol initiated, and pt required mechanical intubation with ROSC at 2213.  Post code cardiac rhythm sinus tachycardia, hr low 100's with prolonged Qtc.  He was able to follow commands post cardiac  arrest.  Pts father Khadir Roam notified via telephone regarding decline in pts condition, and code status/goals of treatment were discussed.  Following discussion pts father Mr. Hyppolite decided to change pts code status to DNR.  Despite aggressive treatment pt declined again requiring maximal doses of levophed, vasopressin, neo-synephrine, and dopamine gtts but still remained severely hypotensive.  He subsequently expired on Feb 01, 2020 at 2023/02/15, and pts father Tysheem Accardo notified via telephone.    Pertinent Labs and Studies  Significant Diagnostic Studies CT ABDOMEN PELVIS WO CONTRAST  Result Date: 01-29-20 CLINICAL DATA:  Per RN note in pt chart "Pt arrives via [facility] from home for generalized weakness since he was "last discharged". Pt d/c from here on 02/24. Pt reports he has been having trouble walking with fatigue but no dizziness. Reports he has been going to dialysis regularly and was supposed to go today as well. Reports pain 7/10 in both legs, NAD noted, A&Ox4. Pt reports nausea and diarrhea" EXAM: CT ABDOMEN AND PELVIS WITHOUT CONTRAST TECHNIQUE: Multidetector CT imaging of the abdomen and pelvis was performed following the standard protocol without IV contrast. COMPARISON:  07/20/2019 FINDINGS: Lower chest: Small left and minimal right pleural effusions. Small pericardial effusion. There is hazy ground-glass opacity at the right lung base. More confluent opacity is noted in the left lower lobe and inferolateral left upper lobe lingula consistent with atelectasis. Hepatobiliary: Liver normal in size and attenuation. No masses. Some increased attenuation in the gallbladder which could reflect small stones. No convincing acute cholecystitis or bile duct dilation. Pancreas: Unremarkable. No pancreatic ductal dilatation or surrounding inflammatory changes. Spleen: Normal in size without focal abnormality. Adrenals/Urinary Tract: No  adrenal masses. Marked bilateral renal atrophy. No hydronephrosis.  Ureters not well visualized. No ureteral dilation. Bladder is decompressed. Stomach/Bowel: No bowel dilation to suggest obstruction. No wall thickening or convincing inflammation. Normal stomach. Vascular/Lymphatic: Superior mesenteric artery, aortic and right common iliac artery stents. No discrete enlarged lymph nodes. Reproductive: Unremarkable. Other: Large amount of ascites distends the abdomen. Musculoskeletal: Chronic bilateral pars defects at L5-S1 with a grade 1 anterolisthesis. No acute fracture. No osteoblastic or osteolytic lesions. IMPRESSION: 1. Large amount of ascites, increased when compared to the prior CT. 2. Patchy ground-glass opacities at the right lung base. Consider pneumonia if there are consistent clinical findings. Left lung base opacity is more consistent with atelectasis. 3. Small left and minimal right pleural effusions. Small pericardial effusion. 4. Marked renal atrophy consistent with end-stage renal disease. 5. Significant atherosclerotic disease with aortic, superior mesenteric artery and right common iliac artery stents, stable from the prior CT. Electronically Signed   By: Lajean Manes M.D.   On: 01/01/2020 14:57   DG Chest 1 View  Result Date: 01/17/2020 CLINICAL DATA:  44 year old male status post intubation. EXAM: CHEST  1 VIEW COMPARISON:  Earlier radiograph dated 01/11/2020. FINDINGS: Endotracheal tube with tip approximately 6.7 cm above the carina. Enteric tube extends below the diaphragm with tip in the body of the stomach. Dialysis catheter in similar position. No significant interval change in the appearance of the lungs or cardiomediastinal silhouette since the earlier radiograph. IMPRESSION: Interval placement of an endotracheal tube with tip above the carina and enteric tube with tip projecting over the body of the stomach. Electronically Signed   By: Anner Crete M.D.   On: 12/23/2019 19:45   DG Chest 1 View  Result Date: 12/30/2019 CLINICAL DATA:   Weakness. Recent pneumonia. EXAM: CHEST  1 VIEW COMPARISON:  12/15/2019 prior radiographs FINDINGS: Cardiomegaly and LEFT IJ central venous catheter with tip overlying the UPPER RIGHT atrium again noted. Small LEFT pleural effusion and LEFT basilar atelectasis/opacity again noted. A LEFT axillary vascular stent is present. Mild pulmonary vascular congestion is present. No pneumothorax or acute bony. IMPRESSION: Unchanged chest radiograph with cardiomegaly, mild pulmonary vascular congestion and small LEFT pleural effusion/LEFT basilar opacity. Electronically Signed   By: Margarette Canada M.D.   On: 01/17/2020 13:18   DG Abd 1 View  Result Date: 01/04/2020 CLINICAL DATA:  OG tube placement EXAM: ABDOMEN - 1 VIEW COMPARISON:  10/29/2019, CT 01/06/2020 FINDINGS: Consolidative process at the left lung base. Esophageal tube tip overlies the proximal stomach. Vascular stent over the spine. Centralized bowel loops, suggesting ascites IMPRESSION: 1. Esophageal tube tip overlies the proximal stomach. 2. Airspace disease at the left base 3. Centralized appearance of the bowel loops likely corresponds to ascites Electronically Signed   By: Donavan Foil M.D.   On: 01/04/2020 23:27   PERIPHERAL VASCULAR CATHETERIZATION  Result Date: 12/16/2019 See op note  PERIPHERAL VASCULAR CATHETERIZATION  Result Date: 12/15/2019 See op note  DG Chest Port 1 View  Result Date: 01/04/2020 CLINICAL DATA:  ETT placement EXAM: PORTABLE CHEST 1 VIEW COMPARISON:  12/30/2019, 10/27/2018 FINDINGS: Endotracheal tube tip is about 2.5 cm superior to the carina. Left upper extremity vascular stents. Right-sided central venous catheter with tip over the right atrium. Esophageal tube tip below the diaphragm. Cardiomegaly with layering left pleural effusion and probable small right effusion. Diffuse bilateral airspace disease with interim worsening. No pneumothorax. IMPRESSION: 1. Endotracheal tube tip about 2.5 cm superior to the carina. 2.  Moderate layering left effusion.  Cardiomegaly with interval worsening of vascular congestion and diffuse airspace disease, suspect for pulmonary edema. Left greater than right lung base consolidation. Probable trace right effusion Electronically Signed   By: Donavan Foil M.D.   On: 01/04/2020 23:26   DG Chest Portable 1 View  Result Date: 12/15/2019 CLINICAL DATA:  Dialysis catheter EXAM: PORTABLE CHEST 1 VIEW COMPARISON:  10/28/2019 FINDINGS: Right dialysis catheter tip overlies right atrium. Small left pleural effusion and left basilar atelectasis. Stable heart size. IMPRESSION: Right dialysis catheter tip overlies right atrium. Small left pleural effusion and left basilar atelectasis. Electronically Signed   By: Macy Mis M.D.   On: 12/15/2019 13:48   VAS [place] UPPER EXTREMITY ARTERIAL DUPLEX  Result Date: 12/29/2019 UPPER EXTREMITY DUPLEX STUDY Indications: Patient complains of Status Post Rt Arm Angioplasty.  Other Factors: 11/02/2019 right brachial and ulnar pta with dis brach stent. Comparison Study: 11/13/2019 Performing Technologist: Almira Coaster RVS  Examination Guidelines: A complete evaluation includes B-mode imaging, spectral Doppler, color Doppler, and power Doppler as needed of all accessible portions of each vessel. Bilateral testing is considered an integral part of a complete examination. Limited examinations for reoccurring indications may be performed as noted.  Right Doppler Findings: +---------------+----------+---------+--------+--------+ Site           PSV (cm/s)Waveform StenosisComments +---------------+----------+---------+--------+--------+ Subclavian Prox15        triphasic                 +---------------+----------+---------+--------+--------+ Subclavian Dist          biphasic                  +---------------+----------+---------+--------+--------+ Axillary       0.14      triphasic                  +---------------+----------+---------+--------+--------+ Brachial Prox  12        triphasic                 +---------------+----------+---------+--------+--------+ Brachial Mid   47        triphasic                 +---------------+----------+---------+--------+--------+ Brachial Dist  37        triphasic                 +---------------+----------+---------+--------+--------+ Radial Prox    30        biphasic                  +---------------+----------+---------+--------+--------+ Radial Dist    33        biphasic                  +---------------+----------+---------+--------+--------+ Ulnar Prox     21        biphasic                  +---------------+----------+---------+--------+--------+   Summary:  Right: Imaging and Waveforms obtained throughout in the Right Upper        Extremity. Compared to prior study on 11/13/2019 Waveforms        have improved. *See table(s) above for measurements and observations. Electronically signed by Leotis Pain MD on 12/29/2019 at 4:36:49 PM.    Final    ECHOCARDIOGRAM COMPLETE  Result Date: 01/03/2020    ECHOCARDIOGRAM REPORT   Patient Name:   LENDELL GALLICK Date of Exam: 01/03/2020 Medical Rec #:  [id number]      Height:  61.0 in Accession #:    [id number]     Weight:       87.7 lb Date of Birth:  [birth year]      BSA:          1.331 m Patient Age:    23 years       BP:           113/87 mmHg Patient Gender: M              HR:           84 bpm. Exam Location:  [facility] Procedure: 2D Echo and Intracardiac Opacification Agent Indications:     Pericardial Effusion  History:         Patient has prior history of Echocardiogram examinations. Risk                  Factors:Hypertension and ESRD.  Sonographer:     LTM Referring Phys:  [id number] CARMEN Veda Canning Diagnosing Phys: Kate Sable MD IMPRESSIONS  1. There is a 7 x 5 mm thrombus in the LV apex. Left ventricular ejection fraction, by estimation, is <20%. The left ventricle has severely  decreased function. The left ventricle demonstrates global hypokinesis. Left ventricular diastolic parameters are  consistent with Grade II diastolic dysfunction (pseudonormalization).  2. Right ventricular systolic function is severely reduced. The right ventricular size is normal.  3. Left atrial size was moderately dilated.  4. Right atrial size was mildly dilated.  5. The pericardial effusion is circumferential. Large pleural effusion in the left lateral region.  6. The mitral valve is grossly normal. Moderate mitral valve regurgitation.  7. Tricuspid valve regurgitation is mild to moderate.  8. The aortic valve is tricuspid. Aortic valve regurgitation is trivial. Conclusion(s)/Recommendation(s): Severe Biventricular dysfunction. Apical LV thombus present. Recommend anticoagulation with clinically appropriate. FINDINGS  Left Ventricle: There is a 7 x 5 mm thrombus in the LV apex. Left ventricular ejection fraction, by estimation, is <20%. The left ventricle has severely decreased function. The left ventricle demonstrates global hypokinesis. Definity contrast agent was given IV to delineate the left ventricular endocardial borders. The left ventricular internal cavity size was normal in size. There is no left ventricular hypertrophy. Left ventricular diastolic parameters are consistent with Grade II diastolic dysfunction (pseudonormalization). Right Ventricle: The right ventricular size is normal. Right vetricular wall thickness was not assessed. Right ventricular systolic function is severely reduced. Left Atrium: Left atrial size was moderately dilated. Right Atrium: Right atrial size was mildly dilated. Pericardium: A small pericardial effusion is present. The pericardial effusion is circumferential. Mitral Valve: The mitral valve is grossly normal. Moderate mitral valve regurgitation. Tricuspid Valve: The tricuspid valve is grossly normal. Tricuspid valve regurgitation is mild to moderate. Aortic Valve: The  aortic valve is tricuspid. Aortic valve regurgitation is trivial. Aortic regurgitation PHT measures 518 msec. Aortic valve mean gradient measures 2.0 mmHg. Aortic valve peak gradient measures 4.2 mmHg. Aortic valve area, by VTI measures  1.22 cm. Pulmonic Valve: The pulmonic valve was normal in structure. Pulmonic valve regurgitation is not visualized. Aorta: The aortic root is normal in size and structure. Venous: IVC assessment for right atrial pressure unable to be performed due to mechanical ventilation. IAS/Shunts: No atrial level shunt detected by color flow Doppler. Additional Comments: There is a large pleural effusion in the left lateral region.  LEFT VENTRICLE PLAX 2D LVIDd:         4.99 cm  Diastology LVIDs:  4.59 cm  LV e' lateral:   4.20 cm/s LV PW:         1.23 cm  LV E/e' lateral: 13.4 LV IVS:        0.93 cm  LV e' medial:    3.68 cm/s LVOT diam:     1.70 cm  LV E/e' medial:  15.3 LV SV:         18 LV SV Index:   13 LVOT Area:     2.27 cm  RIGHT VENTRICLE RV S prime:     5.70 cm/s TAPSE (M-mode): 1.1 cm LEFT ATRIUM             Index LA diam:        3.10 cm 2.33 cm/m LA Vol (A2C):   66.8 ml 50.20 ml/m LA Vol (A4C):   56.3 ml 42.31 ml/m LA Biplane Vol: 62.8 ml 47.19 ml/m  AORTIC VALVE AV Area (Vmax):    1.38 cm AV Area (Vmean):   1.36 cm AV Area (VTI):     1.22 cm AV Vmax:           102.00 cm/s AV Vmean:          68.400 cm/s AV VTI:            0.147 m AV Peak Grad:      4.2 mmHg AV Mean Grad:      2.0 mmHg LVOT Vmax:         62.10 cm/s LVOT Vmean:        41.100 cm/s LVOT VTI:          0.079 m LVOT/AV VTI ratio: 0.54 AI PHT:            518 msec  AORTA Ao Root diam: 2.80 cm MITRAL VALVE               TRICUSPID VALVE MV Area (PHT): 4.15 cm    TR Peak grad:   31.6 mmHg MV E velocity: 56.30 cm/s  TR Vmax:        281.00 cm/s MV A velocity: 50.90 cm/s MV E/A ratio:  1.11        SHUNTS                            Systemic VTI:  0.08 m                            Systemic Diam: 1.70 cm Kate Sable MD Electronically signed by Kate Sable MD Signature Date/Time: 01/03/2020/4:01:27 PM    Final     Microbiology Recent Results (from the past 240 hour(s))  SARS CORONAVIRUS 2 (TAT 6-24 HRS) Nasopharyngeal Nasopharyngeal Swab     Status: None   Collection Time: 01/06/2020  2:21 PM   Specimen: Nasopharyngeal Swab  Result Value Ref Range Status   SARS Coronavirus 2 NEGATIVE NEGATIVE Final    Comment: (NOTE) SARS-CoV-2 target nucleic acids are NOT DETECTED. The SARS-CoV-2 RNA is generally detectable in upper and lower respiratory specimens during the acute phase of infection. Negative results do not preclude SARS-CoV-2 infection, do not rule out co-infections with other pathogens, and should not be used as the sole basis for treatment or other patient management decisions. Negative results must be combined with clinical observations, patient history, and epidemiological information. The expected result is Negative. Fact Sheet for Patients: SugarRoll.be Fact Sheet for Healthcare Providers: https://www.woods-mathews.com/  This test is not yet approved or cleared by the [place] and  has been authorized for detection and/or diagnosis of SARS-CoV-2 by FDA under an Emergency Use Authorization (EUA). This EUA will remain  in effect (meaning this test can be used) for the duration of the COVID-19 declaration under Section 56 4(b)(1) of the Act, 21 U.S.C. section 360bbb-3(b)(1), unless the authorization is terminated or revoked sooner. Performed at [facility], [place] [address] [place], [place] [address]   MRSA PCR Screening     Status: None   Collection Time: 01/04/2020  9:12 PM   Specimen: Nasopharyngeal  Result Value Ref Range Status   MRSA by PCR NEGATIVE NEGATIVE Final    Comment:        The GeneXpert MRSA Assay (FDA approved for NASAL specimens only), is one component of a comprehensive MRSA  colonization surveillance program. It is not intended to diagnose MRSA infection nor to guide or monitor treatment for MRSA infections. Performed at [facility], [address] [place], [place] [address]   CULTURE, BLOOD (ROUTINE X 2) w Reflex to ID Panel     Status: None (Preliminary result)   Collection Time: 01/03/20  6:26 AM   Specimen: BLOOD RIGHT HAND  Result Value Ref Range Status   Specimen Description BLOOD RIGHT HAND  Final   Special Requests   Final    BOTTLES DRAWN AEROBIC AND ANAEROBIC Blood Culture adequate volume   Culture  Setup Time   Final    AEROBIC BOTTLE ONLY GRAM POSITIVE RODS CRITICAL RESULT CALLED TO, READ BACK BY AND VERIFIED WITH: KAREN HAYES @1522  01/04/20 MJU Performed at [facility], [address] [place], [place] [address]    Culture GRAM POSITIVE RODS  Final   Report Status PENDING  Incomplete  CULTURE, BLOOD (ROUTINE X 2) w Reflex to ID Panel     Status: None (Preliminary result)   Collection Time: 01/03/20  7:07 AM   Specimen: BLOOD  Result Value Ref Range Status   Specimen Description BLOOD RHAND  Final   Special Requests   Final    BOTTLES DRAWN AEROBIC AND ANAEROBIC Blood Culture adequate volume   Culture   Final    NO GROWTH < 24 HOURS Performed at [facility], [address] [place], [place] [address]    Report Status PENDING  Incomplete    Lab Basic Metabolic Panel: Recent Labs  Lab 01/01/2020 1358 01/14/2020 1916 01/03/20 0626 01/04/20 0619 01/04/20 0847 01/04/20 2211  NA 133*  --  136 132*  --  147*  K 3.6  --  3.0* 2.6* 2.8* 4.5  CL 91*  --  99 92*  --  93*  CO2 22  --  24 25  --  37*  GLUCOSE 95  --  116* 123*  --  250*  BUN 32*  --  33* 35*  --  32*  CREATININE 5.93* 6.01* 5.68* 5.82*  --  5.20*  CALCIUM 9.0  --  7.8* 6.8*  --  6.5*  MG  --   --  1.8 1.8  --   --   PHOS  --   --  7.3* 6.5*  --   --    Liver Function Tests: Recent Labs  Lab 12/24/2019 1358 01/04/20 2211  AST 18 31   ALT 8 8  ALKPHOS 102 63  BILITOT 3.0* 1.2  PROT 6.6 3.7*  ALBUMIN 2.9* 1.7*   Recent Labs  Lab  01/14/2020 1358  LIPASE 23   No results for input(s): AMMONIA in the last 168 hours. CBC: Recent Labs  Lab 01/16/2020 1358 01/08/2020 1916 01/03/20 0626 01/04/20 0619 01/04/20 2211  WBC 7.7 7.6 5.5 7.1 7.0  NEUTROABS 5.8  --   --  5.3  --   HGB 13.9 14.1 10.7* 11.3* 11.0*  HCT 43.8 47.5 32.3* 34.7* 35.3*  MCV 95.2 102.8* 92.6 92.5 96.7  PLT 88* 73* 45* 70* 54*   Cardiac Enzymes: No results for input(s): CKTOTAL, CKMB, CKMBINDEX, TROPONINI in the last 168 hours. Sepsis Labs: Recent Labs  Lab 01/13/2020 1357 01/13/2020 1358 12/23/2019 1550 12/21/2019 1916 12/21/2019 2156 01/03/20 0626 01/04/20 0619 01/04/20 2211  PROCALCITON  --   --   --   --  0.63 1.49 2.26  --   WBC  --    < >  --  7.6  --  5.5 7.1 7.0  LATICACIDVEN 3.8*  --  5.4*  --   --  2.4*  --   --    < > = values in this interval not displayed.    Procedures/Operations  Mechanical Intubation  Right femoral arterial line placement  Right femoral central line placement   [name], [place] Pager [phone] (please enter 7 digits) PCCM Consult Pager [phone] (please enter 7 digits)

## 2020-01-21 DEATH — deceased

## 2020-03-28 ENCOUNTER — Ambulatory Visit (INDEPENDENT_AMBULATORY_CARE_PROVIDER_SITE_OTHER): Payer: Medicare Other | Admitting: Nurse Practitioner

## 2020-03-28 ENCOUNTER — Other Ambulatory Visit (INDEPENDENT_AMBULATORY_CARE_PROVIDER_SITE_OTHER): Payer: Medicare Other
